# Patient Record
Sex: Female | Born: 1960 | Race: Black or African American | Hispanic: No | Smoking: Never smoker
Health system: Southern US, Community
[De-identification: ages and names within clinical notes are randomized; demographics above are authoritative.]

## PROBLEM LIST (undated history)

## (undated) DIAGNOSIS — E785 Hyperlipidemia, unspecified: Secondary | ICD-10-CM

## (undated) DIAGNOSIS — R531 Weakness: Secondary | ICD-10-CM

## (undated) DIAGNOSIS — Z86718 Personal history of other venous thrombosis and embolism: Secondary | ICD-10-CM

## (undated) DIAGNOSIS — R000 Tachycardia, unspecified: Secondary | ICD-10-CM

## (undated) DIAGNOSIS — R739 Hyperglycemia, unspecified: Secondary | ICD-10-CM

## (undated) DIAGNOSIS — N259 Disorder resulting from impaired renal tubular function, unspecified: Secondary | ICD-10-CM

## (undated) DIAGNOSIS — I6789 Other cerebrovascular disease: Secondary | ICD-10-CM

## (undated) DIAGNOSIS — F329 Major depressive disorder, single episode, unspecified: Secondary | ICD-10-CM

## (undated) DIAGNOSIS — Z7901 Long term (current) use of anticoagulants: Secondary | ICD-10-CM

## (undated) DIAGNOSIS — I2699 Other pulmonary embolism without acute cor pulmonale: Secondary | ICD-10-CM

## (undated) DIAGNOSIS — I6930 Unspecified sequelae of cerebral infarction: Secondary | ICD-10-CM

## (undated) DIAGNOSIS — Z940 Kidney transplant status: Secondary | ICD-10-CM

## (undated) DIAGNOSIS — I509 Heart failure, unspecified: Secondary | ICD-10-CM

## (undated) DIAGNOSIS — M810 Age-related osteoporosis without current pathological fracture: Secondary | ICD-10-CM

## (undated) DIAGNOSIS — K219 Gastro-esophageal reflux disease without esophagitis: Secondary | ICD-10-CM

## (undated) DIAGNOSIS — M329 Systemic lupus erythematosus, unspecified: Secondary | ICD-10-CM

## (undated) DIAGNOSIS — T380X5A Adverse effect of glucocorticoids and synthetic analogues, initial encounter: Secondary | ICD-10-CM

## (undated) DIAGNOSIS — N19 Unspecified kidney failure: Secondary | ICD-10-CM

## (undated) DIAGNOSIS — M109 Gout, unspecified: Secondary | ICD-10-CM

## (undated) DIAGNOSIS — B3781 Candidal esophagitis: Secondary | ICD-10-CM

## (undated) DIAGNOSIS — I10 Essential (primary) hypertension: Secondary | ICD-10-CM

## (undated) DIAGNOSIS — E041 Nontoxic single thyroid nodule: Secondary | ICD-10-CM

## (undated) DIAGNOSIS — E119 Type 2 diabetes mellitus without complications: Secondary | ICD-10-CM

## (undated) DIAGNOSIS — I6350 Cerebral infarction due to unspecified occlusion or stenosis of unspecified cerebral artery: Secondary | ICD-10-CM

## (undated) DIAGNOSIS — F32A Depression, unspecified: Secondary | ICD-10-CM

## (undated) DIAGNOSIS — Z8719 Personal history of other diseases of the digestive system: Secondary | ICD-10-CM

## (undated) DIAGNOSIS — F419 Anxiety disorder, unspecified: Secondary | ICD-10-CM

## (undated) HISTORY — DX: Gastro-esophageal reflux disease without esophagitis: K21.9

## (undated) HISTORY — PX: TUBAL LIGATION: SHX77

## (undated) HISTORY — DX: Depression, unspecified: F32.A

## (undated) HISTORY — DX: Other pulmonary embolism without acute cor pulmonale: I26.99

## (undated) HISTORY — DX: Personal history of other diseases of the digestive system: Z87.19

## (undated) HISTORY — DX: Essential (primary) hypertension: I10

## (undated) HISTORY — DX: Disorder resulting from impaired renal tubular function, unspecified: N25.9

## (undated) HISTORY — PX: CHOLECYSTECTOMY: SHX55

## (undated) HISTORY — DX: Systemic lupus erythematosus, unspecified: M32.9

## (undated) HISTORY — DX: Unspecified kidney failure: N19

## (undated) HISTORY — DX: Hyperlipidemia, unspecified: E78.5

## (undated) HISTORY — DX: Anxiety disorder, unspecified: F41.9

## (undated) HISTORY — DX: Age-related osteoporosis without current pathological fracture: M81.0

## (undated) HISTORY — DX: Cerebral infarction due to unspecified occlusion or stenosis of unspecified cerebral artery: I63.50

## (undated) HISTORY — DX: Personal history of other venous thrombosis and embolism: Z86.718

## (undated) HISTORY — DX: Long term (current) use of anticoagulants: Z79.01

## (undated) HISTORY — DX: Kidney transplant status: Z94.0

## (undated) HISTORY — DX: Major depressive disorder, single episode, unspecified: F32.9

## (undated) HISTORY — DX: Gout, unspecified: M10.9

## (undated) HISTORY — DX: Other cerebrovascular disease: I67.89

## (undated) HISTORY — DX: Heart failure, unspecified: I50.9

## (undated) HISTORY — DX: Type 2 diabetes mellitus without complications: E11.9

## (undated) HISTORY — DX: Tachycardia, unspecified: R00.0

## (undated) HISTORY — DX: Nontoxic single thyroid nodule: E04.1

---
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
## 1998-01-22 ENCOUNTER — Encounter: Admission: RE | Admit: 1998-01-22 | Discharge: 1998-04-22 | Payer: Self-pay | Admitting: Endocrinology

## 1998-11-04 ENCOUNTER — Encounter: Payer: Self-pay | Admitting: Endocrinology

## 1998-11-04 ENCOUNTER — Ambulatory Visit (HOSPITAL_COMMUNITY): Admission: RE | Admit: 1998-11-04 | Discharge: 1998-11-04 | Payer: Self-pay | Admitting: Endocrinology

## 1999-05-31 ENCOUNTER — Emergency Department (HOSPITAL_COMMUNITY): Admission: EM | Admit: 1999-05-31 | Discharge: 1999-05-31 | Payer: Self-pay | Admitting: Internal Medicine

## 1999-09-09 ENCOUNTER — Encounter: Admission: RE | Admit: 1999-09-09 | Discharge: 1999-12-08 | Payer: Self-pay | Admitting: Internal Medicine

## 2000-03-15 ENCOUNTER — Other Ambulatory Visit: Admission: RE | Admit: 2000-03-15 | Discharge: 2000-03-15 | Payer: Self-pay | Admitting: Obstetrics and Gynecology

## 2000-03-23 ENCOUNTER — Encounter: Payer: Self-pay | Admitting: Obstetrics and Gynecology

## 2000-03-23 ENCOUNTER — Ambulatory Visit (HOSPITAL_COMMUNITY): Admission: RE | Admit: 2000-03-23 | Discharge: 2000-03-23 | Payer: Self-pay

## 2000-05-02 ENCOUNTER — Emergency Department (HOSPITAL_COMMUNITY): Admission: EM | Admit: 2000-05-02 | Discharge: 2000-05-02 | Payer: Self-pay | Admitting: Emergency Medicine

## 2000-05-02 ENCOUNTER — Encounter: Payer: Self-pay | Admitting: Emergency Medicine

## 2000-06-07 HISTORY — PX: COLONOSCOPY: SHX174

## 2000-06-23 ENCOUNTER — Encounter: Payer: Self-pay | Admitting: Endocrinology

## 2000-06-23 ENCOUNTER — Ambulatory Visit (HOSPITAL_COMMUNITY): Admission: RE | Admit: 2000-06-23 | Discharge: 2000-06-23 | Payer: Self-pay | Admitting: Endocrinology

## 2000-07-18 ENCOUNTER — Encounter: Admission: RE | Admit: 2000-07-18 | Discharge: 2000-10-16 | Payer: Self-pay | Admitting: Endocrinology

## 2000-07-31 ENCOUNTER — Inpatient Hospital Stay (HOSPITAL_COMMUNITY): Admission: EM | Admit: 2000-07-31 | Discharge: 2000-08-04 | Payer: Self-pay | Admitting: *Deleted

## 2000-08-01 ENCOUNTER — Encounter: Payer: Self-pay | Admitting: Internal Medicine

## 2000-08-02 ENCOUNTER — Encounter: Payer: Self-pay | Admitting: Internal Medicine

## 2000-09-11 ENCOUNTER — Encounter: Payer: Self-pay | Admitting: Emergency Medicine

## 2000-09-12 ENCOUNTER — Encounter: Payer: Self-pay | Admitting: Internal Medicine

## 2000-09-12 ENCOUNTER — Inpatient Hospital Stay (HOSPITAL_COMMUNITY): Admission: EM | Admit: 2000-09-12 | Discharge: 2000-09-13 | Payer: Self-pay | Admitting: Emergency Medicine

## 2002-02-02 ENCOUNTER — Emergency Department (HOSPITAL_COMMUNITY): Admission: EM | Admit: 2002-02-02 | Discharge: 2002-02-03 | Payer: Self-pay | Admitting: Emergency Medicine

## 2002-02-03 ENCOUNTER — Ambulatory Visit (HOSPITAL_COMMUNITY): Admission: AD | Admit: 2002-02-03 | Discharge: 2002-02-03 | Payer: Self-pay | Admitting: Vascular Surgery

## 2002-02-03 ENCOUNTER — Encounter: Payer: Self-pay | Admitting: Vascular Surgery

## 2002-09-04 ENCOUNTER — Inpatient Hospital Stay (HOSPITAL_COMMUNITY): Admission: AD | Admit: 2002-09-04 | Discharge: 2002-09-10 | Payer: Self-pay | Admitting: Endocrinology

## 2002-09-07 ENCOUNTER — Encounter: Payer: Self-pay | Admitting: Endocrinology

## 2003-01-18 ENCOUNTER — Encounter: Payer: Self-pay | Admitting: Emergency Medicine

## 2003-01-18 ENCOUNTER — Inpatient Hospital Stay (HOSPITAL_COMMUNITY): Admission: EM | Admit: 2003-01-18 | Discharge: 2003-01-22 | Payer: Self-pay | Admitting: Emergency Medicine

## 2003-05-19 ENCOUNTER — Emergency Department (HOSPITAL_COMMUNITY): Admission: EM | Admit: 2003-05-19 | Discharge: 2003-05-19 | Payer: Self-pay | Admitting: Emergency Medicine

## 2005-04-22 ENCOUNTER — Ambulatory Visit: Payer: Self-pay | Admitting: Endocrinology

## 2005-05-06 ENCOUNTER — Ambulatory Visit: Payer: Self-pay | Admitting: Endocrinology

## 2005-05-07 ENCOUNTER — Ambulatory Visit: Payer: Self-pay | Admitting: Endocrinology

## 2006-09-12 ENCOUNTER — Ambulatory Visit: Payer: Self-pay | Admitting: Internal Medicine

## 2006-09-29 ENCOUNTER — Emergency Department (HOSPITAL_COMMUNITY): Admission: EM | Admit: 2006-09-29 | Discharge: 2006-09-29 | Payer: Self-pay | Admitting: Emergency Medicine

## 2007-06-06 ENCOUNTER — Encounter: Payer: Self-pay | Admitting: Endocrinology

## 2007-06-08 HISTORY — PX: KIDNEY TRANSPLANT: SHX239

## 2007-08-14 ENCOUNTER — Ambulatory Visit: Payer: Self-pay | Admitting: Internal Medicine

## 2007-08-14 ENCOUNTER — Ambulatory Visit: Payer: Self-pay | Admitting: Infectious Diseases

## 2007-08-14 ENCOUNTER — Inpatient Hospital Stay (HOSPITAL_COMMUNITY): Admission: EM | Admit: 2007-08-14 | Discharge: 2007-08-17 | Payer: Self-pay | Admitting: Emergency Medicine

## 2007-08-21 DIAGNOSIS — M810 Age-related osteoporosis without current pathological fracture: Secondary | ICD-10-CM

## 2007-08-21 DIAGNOSIS — I5032 Chronic diastolic (congestive) heart failure: Secondary | ICD-10-CM | POA: Insufficient documentation

## 2007-08-21 DIAGNOSIS — K219 Gastro-esophageal reflux disease without esophagitis: Secondary | ICD-10-CM

## 2007-08-21 DIAGNOSIS — A0472 Enterocolitis due to Clostridium difficile, not specified as recurrent: Secondary | ICD-10-CM | POA: Insufficient documentation

## 2007-08-21 DIAGNOSIS — N259 Disorder resulting from impaired renal tubular function, unspecified: Secondary | ICD-10-CM

## 2007-08-21 DIAGNOSIS — Z86718 Personal history of other venous thrombosis and embolism: Secondary | ICD-10-CM | POA: Insufficient documentation

## 2007-08-21 DIAGNOSIS — M329 Systemic lupus erythematosus, unspecified: Secondary | ICD-10-CM

## 2007-08-21 DIAGNOSIS — E119 Type 2 diabetes mellitus without complications: Secondary | ICD-10-CM

## 2007-08-21 DIAGNOSIS — I509 Heart failure, unspecified: Secondary | ICD-10-CM

## 2007-08-21 DIAGNOSIS — I10 Essential (primary) hypertension: Secondary | ICD-10-CM | POA: Insufficient documentation

## 2007-08-21 DIAGNOSIS — M109 Gout, unspecified: Secondary | ICD-10-CM | POA: Insufficient documentation

## 2007-08-21 DIAGNOSIS — E785 Hyperlipidemia, unspecified: Secondary | ICD-10-CM

## 2007-08-21 DIAGNOSIS — Z8719 Personal history of other diseases of the digestive system: Secondary | ICD-10-CM

## 2007-08-21 HISTORY — DX: Type 2 diabetes mellitus without complications: E11.9

## 2007-08-21 HISTORY — DX: Personal history of other diseases of the digestive system: Z87.19

## 2007-08-21 HISTORY — DX: Systemic lupus erythematosus, unspecified: M32.9

## 2007-08-21 HISTORY — DX: Essential (primary) hypertension: I10

## 2007-08-21 HISTORY — DX: Age-related osteoporosis without current pathological fracture: M81.0

## 2007-08-21 HISTORY — DX: Gastro-esophageal reflux disease without esophagitis: K21.9

## 2007-08-21 HISTORY — DX: Disorder resulting from impaired renal tubular function, unspecified: N25.9

## 2007-08-21 HISTORY — DX: Hyperlipidemia, unspecified: E78.5

## 2007-08-21 HISTORY — DX: Heart failure, unspecified: I50.9

## 2007-08-21 HISTORY — DX: Gout, unspecified: M10.9

## 2007-08-21 HISTORY — DX: Personal history of other venous thrombosis and embolism: Z86.718

## 2007-08-22 ENCOUNTER — Ambulatory Visit: Payer: Self-pay | Admitting: Endocrinology

## 2007-08-22 DIAGNOSIS — Z940 Kidney transplant status: Secondary | ICD-10-CM

## 2007-08-22 DIAGNOSIS — J209 Acute bronchitis, unspecified: Secondary | ICD-10-CM | POA: Insufficient documentation

## 2007-08-22 HISTORY — DX: Kidney transplant status: Z94.0

## 2007-09-21 ENCOUNTER — Encounter: Payer: Self-pay | Admitting: Endocrinology

## 2007-12-11 ENCOUNTER — Encounter: Payer: Self-pay | Admitting: Endocrinology

## 2008-02-27 ENCOUNTER — Ambulatory Visit: Payer: Self-pay | Admitting: Endocrinology

## 2008-02-27 LAB — CONVERTED CEMR LAB
INR: 2.5 — ABNORMAL HIGH (ref 0.8–1.0)
Prothrombin Time: 26.8 s — ABNORMAL HIGH (ref 10.9–13.3)

## 2008-03-21 ENCOUNTER — Encounter: Payer: Self-pay | Admitting: Endocrinology

## 2008-06-20 ENCOUNTER — Encounter: Payer: Self-pay | Admitting: Endocrinology

## 2008-11-07 ENCOUNTER — Encounter: Payer: Self-pay | Admitting: Endocrinology

## 2008-11-25 ENCOUNTER — Encounter: Payer: Self-pay | Admitting: Endocrinology

## 2008-12-10 ENCOUNTER — Telehealth (INDEPENDENT_AMBULATORY_CARE_PROVIDER_SITE_OTHER): Payer: Self-pay | Admitting: *Deleted

## 2008-12-12 ENCOUNTER — Encounter: Payer: Self-pay | Admitting: Endocrinology

## 2009-03-26 ENCOUNTER — Ambulatory Visit: Payer: Self-pay | Admitting: Internal Medicine

## 2009-03-26 DIAGNOSIS — R05 Cough: Secondary | ICD-10-CM

## 2009-03-26 DIAGNOSIS — R059 Cough, unspecified: Secondary | ICD-10-CM | POA: Insufficient documentation

## 2009-04-10 ENCOUNTER — Ambulatory Visit: Payer: Self-pay | Admitting: Endocrinology

## 2009-04-10 DIAGNOSIS — E041 Nontoxic single thyroid nodule: Secondary | ICD-10-CM

## 2009-04-10 HISTORY — DX: Nontoxic single thyroid nodule: E04.1

## 2009-04-16 ENCOUNTER — Encounter: Admission: RE | Admit: 2009-04-16 | Discharge: 2009-04-16 | Payer: Self-pay | Admitting: Endocrinology

## 2009-04-24 ENCOUNTER — Ambulatory Visit: Payer: Self-pay | Admitting: Internal Medicine

## 2009-04-24 LAB — HM DIABETES FOOT EXAM

## 2009-07-21 ENCOUNTER — Encounter: Payer: Self-pay | Admitting: Endocrinology

## 2009-08-12 ENCOUNTER — Encounter: Payer: Self-pay | Admitting: Endocrinology

## 2009-09-25 ENCOUNTER — Encounter: Payer: Self-pay | Admitting: Endocrinology

## 2009-10-02 ENCOUNTER — Encounter: Payer: Self-pay | Admitting: Endocrinology

## 2009-10-16 ENCOUNTER — Encounter: Payer: Self-pay | Admitting: Endocrinology

## 2009-10-16 ENCOUNTER — Encounter (INDEPENDENT_AMBULATORY_CARE_PROVIDER_SITE_OTHER): Payer: Self-pay | Admitting: *Deleted

## 2009-10-16 LAB — CONVERTED CEMR LAB
ALT: 20 units/L
AST: 28 units/L
CO2: 25 meq/L
Calcium: 9.3 mg/dL
Chloride: 107 meq/L
Creatinine, Ser: 0.6 mg/dL
GFR calc Af Amer: >60 mL/min
GFR calc non Af Amer: >60 mL/min
Glucose, Bld: 111 mg/dL
Potassium: 3.5 meq/L
Sodium: 140 meq/L
Total Bilirubin: 90 mg/dL

## 2009-10-17 ENCOUNTER — Encounter: Payer: Self-pay | Admitting: Endocrinology

## 2009-10-21 ENCOUNTER — Ambulatory Visit: Payer: Self-pay | Admitting: Endocrinology

## 2009-10-31 ENCOUNTER — Telehealth: Payer: Self-pay | Admitting: Endocrinology

## 2010-02-23 ENCOUNTER — Encounter: Payer: Self-pay | Admitting: Endocrinology

## 2010-03-15 ENCOUNTER — Encounter (INDEPENDENT_AMBULATORY_CARE_PROVIDER_SITE_OTHER): Payer: Self-pay | Admitting: Neurology

## 2010-03-15 ENCOUNTER — Inpatient Hospital Stay (HOSPITAL_COMMUNITY): Admission: EM | Admit: 2010-03-15 | Discharge: 2010-03-25 | Payer: Self-pay | Admitting: Emergency Medicine

## 2010-03-16 ENCOUNTER — Encounter (INDEPENDENT_AMBULATORY_CARE_PROVIDER_SITE_OTHER): Payer: Self-pay | Admitting: Neurology

## 2010-03-18 ENCOUNTER — Ambulatory Visit: Payer: Self-pay | Admitting: Physical Medicine & Rehabilitation

## 2010-04-15 ENCOUNTER — Telehealth: Payer: Self-pay | Admitting: Endocrinology

## 2010-04-15 DIAGNOSIS — I6789 Other cerebrovascular disease: Secondary | ICD-10-CM | POA: Insufficient documentation

## 2010-04-15 HISTORY — DX: Other cerebrovascular disease: I67.89

## 2010-04-17 ENCOUNTER — Telehealth: Payer: Self-pay | Admitting: Endocrinology

## 2010-04-17 ENCOUNTER — Ambulatory Visit: Payer: Self-pay | Admitting: Endocrinology

## 2010-04-17 DIAGNOSIS — I6350 Cerebral infarction due to unspecified occlusion or stenosis of unspecified cerebral artery: Secondary | ICD-10-CM

## 2010-04-17 HISTORY — DX: Cerebral infarction due to unspecified occlusion or stenosis of unspecified cerebral artery: I63.50

## 2010-04-22 ENCOUNTER — Encounter: Payer: Self-pay | Admitting: Endocrinology

## 2010-04-23 ENCOUNTER — Encounter: Payer: Self-pay | Admitting: Cardiology

## 2010-04-23 ENCOUNTER — Telehealth: Payer: Self-pay | Admitting: Endocrinology

## 2010-04-23 LAB — CONVERTED CEMR LAB
POC INR: 1.6
Prothrombin Time: 19.5 s

## 2010-04-24 ENCOUNTER — Encounter: Payer: Self-pay | Admitting: Cardiology

## 2010-04-24 ENCOUNTER — Telehealth (INDEPENDENT_AMBULATORY_CARE_PROVIDER_SITE_OTHER): Payer: Self-pay | Admitting: *Deleted

## 2010-04-27 ENCOUNTER — Telehealth: Payer: Self-pay | Admitting: Endocrinology

## 2010-04-29 ENCOUNTER — Ambulatory Visit: Payer: Self-pay | Admitting: Cardiology

## 2010-04-29 LAB — CONVERTED CEMR LAB: POC INR: 2.5

## 2010-05-05 ENCOUNTER — Ambulatory Visit: Payer: Self-pay | Admitting: Cardiology

## 2010-05-05 ENCOUNTER — Telehealth (INDEPENDENT_AMBULATORY_CARE_PROVIDER_SITE_OTHER): Payer: Self-pay | Admitting: *Deleted

## 2010-05-05 LAB — CONVERTED CEMR LAB: POC INR: 1.3

## 2010-05-12 ENCOUNTER — Ambulatory Visit: Payer: Self-pay | Admitting: Cardiovascular Disease

## 2010-05-12 LAB — CONVERTED CEMR LAB: POC INR: 2.1

## 2010-05-13 ENCOUNTER — Encounter
Admission: RE | Admit: 2010-05-13 | Discharge: 2010-06-03 | Payer: Self-pay | Source: Home / Self Care | Attending: Endocrinology | Admitting: Endocrinology

## 2010-05-13 ENCOUNTER — Encounter: Payer: Self-pay | Admitting: Endocrinology

## 2010-05-20 ENCOUNTER — Encounter: Payer: Self-pay | Admitting: Endocrinology

## 2010-05-22 ENCOUNTER — Ambulatory Visit: Payer: Self-pay | Admitting: Endocrinology

## 2010-05-22 LAB — CONVERTED CEMR LAB: Hgb A1c MFr Bld: 5.9 % (ref 4.6–6.5)

## 2010-05-26 ENCOUNTER — Ambulatory Visit: Payer: Self-pay | Admitting: Cardiology

## 2010-05-26 LAB — CONVERTED CEMR LAB: POC INR: 3

## 2010-06-03 ENCOUNTER — Encounter
Admission: RE | Admit: 2010-06-03 | Discharge: 2010-07-07 | Payer: Self-pay | Source: Home / Self Care | Attending: Endocrinology | Admitting: Endocrinology

## 2010-06-16 ENCOUNTER — Emergency Department (HOSPITAL_COMMUNITY)
Admission: EM | Admit: 2010-06-16 | Discharge: 2010-06-16 | Payer: Self-pay | Source: Home / Self Care | Admitting: Emergency Medicine

## 2010-06-16 ENCOUNTER — Encounter: Payer: Self-pay | Admitting: Endocrinology

## 2010-06-16 ENCOUNTER — Ambulatory Visit: Admit: 2010-06-16 | Payer: Self-pay

## 2010-06-22 ENCOUNTER — Encounter: Payer: Self-pay | Admitting: Endocrinology

## 2010-06-26 ENCOUNTER — Ambulatory Visit: Admission: RE | Admit: 2010-06-26 | Discharge: 2010-06-26 | Payer: Self-pay | Source: Home / Self Care

## 2010-06-26 LAB — CONVERTED CEMR LAB: POC INR: 2.5

## 2010-06-29 ENCOUNTER — Encounter: Admit: 2010-06-29 | Payer: Self-pay | Admitting: Endocrinology

## 2010-07-01 ENCOUNTER — Ambulatory Visit
Admission: RE | Admit: 2010-07-01 | Discharge: 2010-07-01 | Payer: Self-pay | Source: Home / Self Care | Attending: Internal Medicine | Admitting: Internal Medicine

## 2010-07-05 LAB — CONVERTED CEMR LAB: Hgb A1c MFr Bld: 5.7 %

## 2010-07-07 NOTE — Assessment & Plan Note (Signed)
Summary: hospital f/u SD   Vital Signs:  Patient Profile:   50 Years Old Female Weight:      164.2 pounds Temp:     98.4 degrees F oral BP sitting:   112 / 82  (left arm)  Vitals Entered By: Estell Harpin CMA (August 22, 2007 1:05 PM)                 Visit Type:  Follow-up Visit  Chief Complaint:  hospital follow up.  History of Present Illness: pt feels much better since she was in hospital recently for acute bronchitisy. still has a dry cough.     Current Allergies: No known allergies   Past Medical History:    Reviewed history from 08/21/2007 and no changes required:       Congestive heart failure, [facility]       GERD       Hyperlipidemia       Hypertension, dr [facility]ree [facility]       Renal insufficiency, dr [facility]       DVT, hx of, [facility]       Anticoagulation therapy, [facility]       Osteoporosis, [facility]       Gout, [facility]       depression, [facility]     Review of Systems  The patient denies fever and dyspnea on exhertion.     Physical Exam  General:     obese.   Lungs:     clear to auscultation     Impression & Recommendations:  Problem # 1:  ACUTE BRONCHITIS (ICD-466.0) Assessment: Improved  Orders: Est. Patient Level III [id number])    Patient Instructions: 1)  no more rx needed for acute bronchitis 2)  cc dr Bonney Leitz    ]  Appended Document: hospital f/u SD FAXED NOTESTO DR. ADAMS @ [phone]/LMB

## 2010-07-07 NOTE — Miscellaneous (Signed)
Summary: A1C  Clinical Lists Changes  Observations: Added new observation of HGBA1C: 5.7 % (06/20/2008 16:47)      -  Date:  06/20/2008    HbA1c: 5.7 [facility]/ CF

## 2010-07-07 NOTE — Medication Information (Signed)
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 no    Recent/future dental: no  Any missed doses?: no       Is patient compliant with meds? yes       Current Medications (verified): 1)  Cellcept 250 Mg  Caps (Mycophenolate Mofetil) .... Take 1 By Mouth Qd 2)  Dilt-Xr 180 Mg  Cp24 (Diltiazem Hcl) .... Take 1 By Mouth Qd 3)  Folic Acid 1 Mg  Tabs (Folic Acid) .... Take 1 By Mouth Qd 4)  Nexium 40 Mg  Cpdr (Esomeprazole Magnesium) .... Take 1 By Mouth Qd 5)  Prograf 1 Mg  Caps (Tacrolimus) .... Take 1 By Mouth Two Times A Day Qd 6)  Zoloft 100 Mg  Tabs (Sertraline Hcl) .... Take 1 By Mouth Qd 7)  Zinc Sulfate 1 Mg/ml  Soln (Zinc Sulfate) .... Take 1 By Mouth Qd 8)  Prednisone 1 Mg Tabs (Prednisone) .... Take 3 By Mouth Qd 9)  Warfarin Sodium 4 Mg Tabs (Warfarin Sodium) .... Take As Directed 10)  Prodigy Pocket Blood Glucose W/device Kit (Blood Glucose Monitoring Suppl) .... As Dir 11)  Glimepiride 4 Mg Tabs (Glimepiride) .Marland Kitchen.. 1 Tab Each Am 12)  Prodigy No Coding Blood Gluc  Strp (Glucose Blood) .... Once Daily, and Lancets 250.00 13)  Catapres-Tts-1 0.1 Mg/24hr Ptwk (Clonidine Hcl) .Marland Kitchen.. 1 Q Week 14)  Warfarin Sodium 1 Mg Tabs (Warfarin Sodium) .... Use As Directed By Anticoagualtion Clinic  Allergies: 1)  ! Codeine 2)  ! Percocet 3)  ! Darvocet 4)  ! Sulfa  Anticoagulation Management History:      The patient is taking warfarin and comes in today for a routine follow up visit.  Positive risk factors for bleeding  include history of CVA/TIA and presence of serious comorbidities.  Negative risk factors for bleeding include an age less than 60 years old.  The bleeding index is 'intermediate risk'.  Positive CHADS2 values include History of CHF, History of HTN, History of Diabetes, and Prior Stroke/CVA/TIA.  Negative CHADS2 values include Age > 71 years old.  Her last INR was 2.5 RATIO.  Anticoagulation responsible provider: Aundra Dubin MD, Issak Goley.  INR POC: 2.5.  Cuvette Lot#: [id number].    Anticoagulation Management Assessment/Plan:      The patient's current anticoagulation dose is Warfarin sodium 4 mg tabs: take as directed, Warfarin sodium 1 mg tabs: Use as directed by Anticoagualtion Clinic.  The next INR is due 05/06/2010.  Results were reviewed/authorized by Bonnita Nasuti, PharmD, BCPS, CPP.         Prior Anticoagulation Instructions: INR 1.6 Take an extra 2 tablets of the 1 mg today. Then resume normal schedule of 5 mg everyday. Recheck 11/22.   Current Anticoagulation Instructions: INR 2.5  Coumadin 4mg  tabs and 1mg  tabs take 1 tab of each daily Prescriptions: WARFARIN SODIUM 1 MG TABS (WARFARIN SODIUM) Use as directed by Anticoagualtion Clinic  #45 x 3   Entered by:   Gypsy Lore PharmD   Authorized by:   Renella Cunas, MD, [facility]   Signed  by:   Gypsy Lore PharmD on 04/29/2010   Method used:   Electronically to        [facility] [address] [phone]* (retail)       [address]       [address]       [place], [place]  [address]       Ph: [phone]       Fax: [phone]   RxID:   [id number] WARFARIN SODIUM 4 MG TABS (WARFARIN SODIUM) take as directed  #45 x 3   Entered by:   Gypsy Lore PharmD   Authorized by:   Renella Cunas, MD, [facility]   Signed by:   Gypsy Lore PharmD on 04/29/2010   Method used:   Electronically to        [facility] [address] [phone]* (retail)       [address]       [address]       [place], [place]  [address]       Ph: [phone]       Fax: [phone]    RxID:   [id number]

## 2010-07-07 NOTE — Miscellaneous (Signed)
Summary: Order/[facility]  Order/[facility]   Imported By: Phillis Knack 04/27/2010 07:43:00  _____________________________________________________________________  External Attachment:    Type:   Image     Comment:   External Document

## 2010-07-07 NOTE — Progress Notes (Signed)
Summary: Referral  Phone Note Call from Patient   Caller: Daughter [phone]  Summary of Call: Pt's daughter called to check states of referral for in-home services with [facility], daughter states that she discussed referral with [facility] at pt's last ov. Initial call taken by: Crissie Sickles, [place],  April 24, 2010 11:57 AM  Follow-up for Phone Call        [facility] would advise pt about this Follow-up by: Donavan Foil MD,  April 24, 2010 12:12 PM  Additional Follow-up for Phone Call Additional follow up Details #1::        Per daugther pt needs to be referred to [facility] which is [facility] service for assessment and services, not [facility]. Per daughter pt needs services with [place] [phone] Additional Follow-up by: Crissie Sickles, CMA,  April 24, 2010 12:53 PM    Additional Follow-up for Phone Call Additional follow up Details #2::    i forwarded to pcc Follow-up by: Donavan Foil MD,  April 24, 2010 1:02 PM

## 2010-07-07 NOTE — Progress Notes (Signed)
Summary: ABX?  Phone Note Call from Patient Call back at Home Phone [phone]   Caller: Patient Summary of Call: pt called stating that she has completed course of ABX rx'd by MD however she is still experiencing cold sys (cough, congestions and ST). Pt is requesting another ABX. Initial call taken by: Crissie Sickles, [place],  Oct 31, 2009 11:24 AM  Follow-up for Phone Call        verify no fever of sob.   i sent rx for augmentin to pharmacy Follow-up by: Donavan Foil MD,  Oct 31, 2009 12:59 PM  Additional Follow-up for Phone Call Additional follow up Details #1::        pt informed Additional Follow-up by: Crissie Sickles, [place],  Oct 31, 2009 1:33 PM    New/Updated Medications: AUGMENTIN 500-125 MG TABS (AMOXICILLIN-POT CLAVULANATE) 1 tab three times a day Prescriptions: AUGMENTIN 500-125 MG TABS (AMOXICILLIN-POT CLAVULANATE) 1 tab three times a day  #21 x 0   Entered and Authorized by:   Donavan Foil MD   Signed by:   Donavan Foil MD on 10/31/2009   Method used:   Electronically to        [facility] Dr. [phone]* (retail)       [address]       [address]       [place], [place]  [address]       Ph: [phone]       Fax: [phone]   [phone]

## 2010-07-07 NOTE — Letter (Signed)
Summary: Otolaryngology/[facility]  Otolaryngology/[facility]   Imported By: Phillis Knack 10/03/2009 09:15:36  _____________________________________________________________________  External Attachment:    Type:   Image     Comment:   External Document

## 2010-07-07 NOTE — Consult Note (Signed)
Summary: Return [place]   Return [place]   Imported By: Jerrye Noble D'jimraou 07/21/2007 09:12:39  _____________________________________________________________________  External Attachment:    Type:   Image     Comment:   External Document

## 2010-07-07 NOTE — Assessment & Plan Note (Signed)
Summary: chest cold/sae/cd   Vital Signs:  Patient profile:   50 year old female Height:      64 inches Weight:      173 pounds BMI:     29.80 O2 Sat:      98 % on Room air Temp:     98.8 degrees F oral Pulse rate:   86 / minute Pulse rhythm:   regular Resp:     16 per minute BP sitting:   144 / 82  (left arm) Cuff size:   large  Vitals Entered By: Estell Harpin CMA (April 24, 2009 2:24 PM)  Nutrition Counseling: Patient's BMI is greater than 25 and therefore counseled on weight management options.  O2 Flow:  Room air CC: chest/congestion, URI symptoms Is Patient Diabetic? Yes Did you bring your meter with you today? No   Primary Care Provider:  Donavan Foil MD  CC:  chest/congestion and URI symptoms.  History of Present Illness:  URI Symptoms      This is a 50 year old woman who presents with URI symptoms.  The symptoms began 5 days ago.  The severity is described as moderate.  The patient reports nasal congestion, sore throat, and productive cough, but denies earache and sick contacts.  Associated symptoms include low-grade fever (<100.5 degrees).  The patient denies stiff neck, dyspnea, wheezing, rash, vomiting, diarrhea, and use of an antipyretic.  The patient denies headache, muscle aches, and severe fatigue.  The patient denies the following risk factors for Strep sinusitis: Strep exposure and tender adenopathy.    Preventive Screening-Counseling & Management  Alcohol-Tobacco     Alcohol drinks/day: 0     Smoking Status: never  Current Medications (verified): 1)  Cellcept 250 Mg  Caps (Mycophenolate Mofetil) .... Take 1 By Mouth Qd 2)  Dilt-Xr 180 Mg  Cp24 (Diltiazem Hcl) .... Take 1 By Mouth Qd 3)  Folic Acid 1 Mg  Tabs (Folic Acid) .... Take 1 By Mouth Qd 4)  Nexium 40 Mg  Cpdr (Esomeprazole Magnesium) .... Take 1 By Mouth Qd 5)  Prograf 1 Mg  Caps (Tacrolimus) .... Take 1 By Mouth Two Times A Day Qd 6)  Zoloft 100 Mg  Tabs (Sertraline Hcl) .... Take 1 By  Mouth Qd 7)  Zinc Sulfate 1 Mg/ml  Soln (Zinc Sulfate) .... Take 1 By Mouth Qd 8)  Prednisone 1 Mg Tabs (Prednisone) .... Take 3 By Mouth Qd 9)  Warfarin Sodium 4 Mg Tabs (Warfarin Sodium) .... Take 1 1/2 By Mouth Qd  Allergies (verified): 1)  ! Codeine 2)  ! Percocet 3)  ! Darvocet 4)  ! Sulfa  Past History:  Past Medical History: Reviewed history from 08/22/2007 and no changes required. Congestive heart failure, dr Andree Elk, [facility] GERD Hyperlipidemia Hypertension, dr Andree Elk, [facility] Renal insufficiency, dr [facility]ree El[facility], [facility] DVT, hx of, dr Andree Elk, [facility] Anticoagulation therapy, [facility]ree [facility], [facility] Osteoporosis, [facility] Gout, [facility] depression, dr [facility]ree Elk, [facility]  Past Surgical History: Reviewed history from 08/21/2007 and no changes required. Cholecystectomy Tubal ligation  Family History: Reviewed history from 04/10/2009 and no changes required. no cancer  Social History: Reviewed history from 03/26/2009 and no changes required. Retired Divorced Never Smoked Alcohol use-no Drug use-no Regular exercise-no  Review of Systems  The patient denies fever, chest pain, syncope, dyspnea on exertion, peripheral edema, hemoptysis, abdominal pain, suspicious skin lesions, enlarged lymph nodes, and angioedema.    Physical Exam  General:  well developed, well nourished, in no  acute distress Head:  normocephalic and atraumatic.   Ears:  R ear normal and L ear normal.   Mouth:  good dentition, no exudates, no posterior lymphoid hypertrophy, no postnasal drip, no pharyngeal crowing, no lesions, no aphthous ulcers, no erosions, and pharyngeal erythema.   Neck:  supple, full ROM, no masses, no thyromegaly, no JVD, normal carotid upstroke, no carotid bruits, and no cervical lymphadenopathy.   Lungs:  Normal respiratory effort, chest expands symmetrically. Lungs are clear to auscultation, no crackles or wheezes. Heart:  Normal rate and regular rhythm. S1 and S2  normal without gallop, murmur, click, rub or other extra sounds. Abdomen:  soft, non-tender, normal bowel sounds, no distention, no masses, no guarding, no rigidity, no rebound tenderness, no hepatomegaly, and no splenomegaly.   Msk:  normal ROM, no joint tenderness, and no joint swelling.   Pulses:  R and L carotid,radial,femoral,dorsalis pedis and posterior tibial pulses are full and equal bilaterally Extremities:  No clubbing, cyanosis, edema, or deformity noted with normal full range of motion of all joints.   Neurologic:  No cranial nerve deficits noted. Station and gait are normal. Plantar reflexes are down-going bilaterally. DTRs are symmetrical throughout. Sensory, motor and coordinative functions appear intact. Skin:  turgor normal, color normal, no rashes, no suspicious lesions, no ecchymoses, no petechiae, and no purpura.   Cervical Nodes:  no anterior cervical adenopathy and no posterior cervical adenopathy.   Axillary Nodes:  no R axillary adenopathy and no L axillary adenopathy.   Inguinal Nodes:  no R inguinal adenopathy and no L inguinal adenopathy.   Psych:  Cognition and judgment appear intact. Alert and cooperative with normal attention span and concentration. No apparent delusions, illusions, hallucinations  Diabetes Management Exam:    Foot Exam (with socks and/or shoes not present):       Sensory-Pinprick/Light touch:          Left medial foot (L-4): normal          Left dorsal foot (L-5): normal          Left lateral foot (S-1): normal          Right medial foot (L-4): normal          Right dorsal foot (L-5): normal          Right lateral foot (S-1): normal       Sensory-Monofilament:          Left foot: normal          Right foot: normal       Inspection:          Left foot: normal          Right foot: normal       Nails:          Left foot: normal          Right foot: normal   Impression & Recommendations:  Problem # 1:  ACUTE BRONCHITIS  (ICD-466.0) Assessment Deteriorated  Her updated medication list for this problem includes:    Zithromax Tri-pak 500 Mg Tab (Azithromycin) ..... Once daily for 3 days  Take antibiotics and other medications as directed. Encouraged to push clear liquids, get enough rest, and take acetaminophen as needed. To be seen in 5-7 days if no improvement, sooner if worse.  Problem # 2:  COUGH (ICD-786.2) Assessment: Unchanged  Orders: T-2 View CXR ([id number])  Problem # 3:  HYPERTENSION (ICD-401.9) Assessment: Improved  Her updated medication list for this problem includes:  Dilt-xr 180 Mg Cp24 (Diltiazem hcl) .Marland Kitchen... Take 1 by mouth qd  BP today: 144/82 Prior BP: 124/80 (04/10/2009)  Complete Medication List: 1)  Cellcept 250 Mg Caps (Mycophenolate mofetil) .... Take 1 by mouth qd 2)  Dilt-xr 180 Mg Cp24 (Diltiazem hcl) .... Take 1 by mouth qd 3)  Folic Acid 1 Mg Tabs (Folic acid) .... Take 1 by mouth qd 4)  Nexium 40 Mg Cpdr (Esomeprazole magnesium) .... Take 1 by mouth qd 5)  Prograf 1 Mg Caps (Tacrolimus) .... Take 1 by mouth two times a day qd 6)  Zoloft 100 Mg Tabs (Sertraline hcl) .... Take 1 by mouth qd 7)  Zinc Sulfate 1 Mg/ml Soln (Zinc sulfate) .... Take 1 by mouth qd 8)  Prednisone 1 Mg Tabs (Prednisone) .... Take 3 by mouth qd 9)  Warfarin Sodium 4 Mg Tabs (Warfarin sodium) .... Take 1 1/2 by mouth qd 10)  Zithromax Tri-pak 500 Mg Tab (Azithromycin) .... Once daily for 3 days  Patient Instructions: 1)  Please schedule a follow-up appointment in 2 weeks. 2)  Take your antibiotic as prescribed until ALL of it is gone, but stop if you develop a rash or swelling and contact our office as soon as possible. 3)  Acute bronchitis symptoms for less than 10 days are not helped by antibiotics. take over the counter cough medications. call if no improvment in  5-7 days, sooner if increasing cough, fever, or new symptoms( shortness of breath, chest pain). Prescriptions: ZITHROMAX TRI-PAK  500 MG TAB (AZITHROMYCIN) once daily for 3 days  #3 x 0   Entered and Authorized by:   Janith Lima MD   Signed by:   Janith Lima MD on 04/24/2009   Method used:   Print then Give to Patient   RxID:   [id number]

## 2010-07-07 NOTE — Assessment & Plan Note (Signed)
Summary: cold,cough,coughing up phlegm/cd   Vital Signs:  Patient profile:   50 year old female Height:      64 inches (162.56 cm) Weight:      181.50 pounds (82.50 kg) O2 Sat:      97 % on Room air Temp:     98.6 degrees F (37.00 degrees C) oral Pulse rate:   107 / minute BP sitting:   100 / 74  (left arm) Cuff size:   regular  Vitals Entered By: Gardenia Phlegm RMA (Oct 21, 2009 4:15 PM)  O2 Flow:  Room air CC: Cold, Cough, and Coughing up phlegm X1 week/ CF Is Patient Diabetic? Yes   Primary Provider:  Donavan Foil MD  CC:  Cold, Cough, and and Coughing up phlegm X1 week/ CF.  History of Present Illness: pt states 1 week of prod cough, nasal congestion, sore throat, and low-grade fever.  Current Medications (verified): 1)  Cellcept 250 Mg  Caps (Mycophenolate Mofetil) .... Take 1 By Mouth Qd 2)  Dilt-Xr 180 Mg  Cp24 (Diltiazem Hcl) .... Take 1 By Mouth Qd 3)  Folic Acid 1 Mg  Tabs (Folic Acid) .... Take 1 By Mouth Qd 4)  Nexium 40 Mg  Cpdr (Esomeprazole Magnesium) .... Take 1 By Mouth Qd 5)  Prograf 1 Mg  Caps (Tacrolimus) .... Take 1 By Mouth Two Times A Day Qd 6)  Zoloft 100 Mg  Tabs (Sertraline Hcl) .... Take 1 By Mouth Qd 7)  Zinc Sulfate 1 Mg/ml  Soln (Zinc Sulfate) .... Take 1 By Mouth Qd 8)  Prednisone 1 Mg Tabs (Prednisone) .... Take 3 By Mouth Qd 9)  Warfarin Sodium 4 Mg Tabs (Warfarin Sodium) .... Take 1 1/2 By Mouth Qd  Allergies (verified): 1)  ! Codeine 2)  ! Percocet 3)  ! Darvocet 4)  ! Sulfa  Past History:  Past Medical History: Last updated: 08/22/2007 Congestive heart failure, dr Andree Elk, [facility] GERD Hyperlipidemia Hypertension, [facility]ree El[facility] Renal insufficiency, [facility]ree [facility] DVT, hx of, [facility] Andree Elk, [facility] Anticoagulation therapy, [facility]ree [facility] Osteoporosis, [facility]out[facility] depression, [facility]  Review of Systems       denies earache  Physical Exam  General:  obese.  no  distress  Head:  head: no deformity eyes: no periorbital swelling, no proptosis external nose and ears are normal mouth: no lesion seen Ears:  TM's intact and clear with normal canals with grossly normal hearing.   Lungs:  Clear to auscultation bilaterally. Normal respiratory effort.    Impression & Recommendations:  Problem # 1:  COUGH (ICD-786.2) due to uri  Medications Added to Medication List This Visit: 1)  Cefuroxime Axetil 250 Mg Tabs (Cefuroxime axetil) .Marland Kitchen.. 1 tab two times a day 2)  Loratadine-d 24hr 10-240 Mg Xr24h-tab (Loratadine-pseudoephedrine) .Marland Kitchen.. 1 once daily as needed for congestion 3)  Benzonatate 200 Mg Caps (Benzonatate) .Marland Kitchen.. 1 three times a day as needed for cough  Other Orders: Est. Patient Level III [id number])  Patient Instructions: 1)  cefuroxime 250 mg two times a day 2)  loratadine-d 1/day as needed for congestion 3)  benzonatate 200 mg three times a day as needed for cough. 4)  call next week if not better Prescriptions: BENZONATATE 200 MG CAPS (BENZONATATE) 1 three times a day as needed for cough  #30 x 0   Entered and Authorized by:   Donavan Foil MD   Signed by:   Donavan Foil MD  on 10/21/2009   Method used:   Electronically to        [facility] Dr[address] [phone]* (retail)       [address]       [address]       [place], [place]  [address]       Ph: [phone]       Fax: [phone]   RxID:   [id number] LORATADINE-D 24HR 10-240 MG XR24H-TAB (LORATADINE-PSEUDOEPHEDRINE) 1 once daily as needed for congestion  #14 x 0   Entered and Authorized by:   Donavan Foil MD   Signed by:   Donavan Foil MD on 10/21/2009   Method used:   Electronically to        [facility] Dr[address] [phone]* (retail)       [address]       [address]       [place], [place]  [address]       Ph: [phone]       Fax: [phone]   [id number] CEFUROXIME AXETIL 250 MG TABS (CEFUROXIME AXETIL) 1 tab two times a day  #14 x 0   Entered  and Authorized by:   Donavan Foil MD   Signed by:   Donavan Foil MD on 10/21/2009   Method used:   Electronically to        [facility] [address] [phone]* (retail)       [address]       [address]       [place], [place]  [address]       Ph: [phone]       Fax: [phone]   [place]:   [phone]

## 2010-07-07 NOTE — Medication Information (Signed)
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 no    Recent/future dental: no  Any missed doses?: no       Is patient compliant with meds? yes       Allergies: 1)  ! Codeine 2)  ! Percocet 3)  ! Darvocet 4)  ! Sulfa  Anticoagulation Management History:      The patient is taking warfarin and comes in today for a routine follow up visit.  Positive risk factors for bleeding include history of CVA/TIA and presence of serious comorbidities.  Negative risk factors for bleeding include an age less than 19 years old.  The bleeding index is 'intermediate risk'.  Positive CHADS2 values include History of CHF, History of HTN, History of Diabetes, and Prior Stroke/CVA/TIA.  Negative CHADS2 values include Age > 82 years old.  Her last INR was 2.5 RATIO.  Anticoagulation responsible provider: Johnsie Cancel MD, Collier Salina.  INR POC: 2.1.  Cuvette Lot#: [id number].  Exp: 03/2011.    Anticoagulation Management Assessment/Plan:      The patient's current anticoagulation dose is Warfarin sodium 4 mg tabs: take as directed, Warfarin sodium 1 mg tabs: Use as directed by Anticoagualtion Clinic.  The target INR is 2.0-3.0.  The next INR is due 05/26/2010.  Anticoagulation instructions were given to patient.  Results were reviewed/authorized by Porfirio Oar, PharmD.  She was notified by Porfirio Oar PharmD.         Prior Anticoagulation Instructions: INR 1.3  Take an extra 4mg  of Coumadin today, tomorrow take a total of 9mg  (1 of 1mg  tablet and 2 of the 4mg  tablets) then resume  same dose of 5mg  daily (1 of 1 mg tablet and 1 of 4 mg tablet).  Recheck INR in 1 week.   Current Anticoagulation Instructions: INR 2.1  Continue same dose of 5mg  daily.  Recheck INR in 2 weeks.

## 2010-07-07 NOTE — Letter (Signed)
Summary: Otolaryngology/[facility]  Otolaryngology/[facility]   Imported By: Phillis Knack 10/16/2009 10:05:39  _____________________________________________________________________  External Attachment:    Type:   Image     Comment:   External Document

## 2010-07-07 NOTE — Assessment & Plan Note (Signed)
Summary: post discharge [facility]/cd   Vital Signs:  Patient profile:   50 year old female Height:      64 inches (162.56 cm) Weight:      175.38 pounds (79.72 kg) BMI:     30.21 O2 Sat:      97 % on Room air Temp:     98.7 degrees F (37.06 degrees C) oral Pulse rate:   91 / minute BP sitting:   108 / 68  (right arm) Cuff size:   regular  Vitals Entered By: Rebeca Alert CMA Deborra Medina) (April 17, 2010 1:48 PM)  O2 Flow:  Room air CC: Follow up after stroke/aj Is Patient Diabetic? Yes   Primary Provider:  Donavan Foil MD  CC:  Follow up after stroke/aj.  History of Present Illness: the status of at least 3 ongoing medical problems is addressed today: 1.  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  coumadin was resumed.  she still has weakness of the entire left side of the body.  she says, compared to how the weakness was at its worst, it is now 50% better.  she was just released from [facility].  i can access the d/c summary from the hospital, but no d/c summary is available from the nh. 2.  she takes medicine for dm, but does not know the name of it.  dtr says she believes it to be amaryl 4 mg each am.  pt is living with her dtr as of now.  pt does not have a cbg monitor. 3.  she takes and tolerates htn meds.   Current Medications (verified): 1)  Cellcept 250 Mg  Caps (Mycophenolate Mofetil) .... Take 1 By Mouth Qd 2)  Dilt-Xr 180 Mg  Cp24 (Diltiazem Hcl) .... Take 1 By Mouth Qd 3)  Folic Acid 1 Mg  Tabs (Folic Acid) .... Take 1 By Mouth Qd 4)  Nexium 40 Mg  Cpdr (Esomeprazole Magnesium) .... Take 1 By Mouth Qd 5)  Prograf 1 Mg  Caps (Tacrolimus) .... Take 1 By Mouth Two Times A Day Qd 6)  Zoloft 100 Mg  Tabs (Sertraline Hcl) .... Take 1 By Mouth Qd 7)  Zinc Sulfate 1 Mg/ml  Soln (Zinc Sulfate) .... Take 1 By Mouth Qd 8)  Prednisone 1 Mg Tabs (Prednisone) .... Take 3 By Mouth Qd 9)  Warfarin Sodium 4 Mg Tabs (Warfarin Sodium) .... Take 1 1/2  By Mouth Qd 10)  Loratadine-D 24hr 10-240 Mg Xr24h-Tab (Loratadine-Pseudoephedrine) .Marland Kitchen.. 1 Once Daily As Needed For Congestion 11)  Benzonatate 200 Mg Caps (Benzonatate) .Marland Kitchen.. 1 Three Times A Day As Needed For Cough 12)  Augmentin 500-125 Mg Tabs (Amoxicillin-Pot Clavulanate) .Marland Kitchen.. 1 Tab Three Times A Day  Allergies (verified): 1)  ! Codeine 2)  ! Percocet 3)  ! Darvocet 4)  ! Sulfa  Past History:  Past Medical History: Last updated: 08/22/2007 Congestive heart failure, dr Andree Elk, [facility] GERD Hyperlipidemia Hypertension, dr [facility]ree [facility] Renal insufficiency, dr [facility]ree [facility] DVT, hx of, dr [facility]ree [facility] Anticoagulation therapy, [facility] Osteoporosis, [facility]out, [facility] depression, [facility]  Review of Systems       The patient complains of weight loss.  The patient denies syncope.    Physical Exam  General:  obese.  in wheelchair.  no distress Pulses:  dorsalis pedis intact bilat.    Extremities:  the toes of the roight foot are chronically deviated medially. trace right pedal edema  and trace left pedal edema.  mycotic toenails.     Neurologic:  sensation is intact to touch on the left foot, and decreased on the right.   Impression & Recommendations:  Problem # 1:  CVA (ICD-434.91) improved pt is ref to hh  Problem # 2:  DIABETES MELLITUS, TYPE II ([id number]) uncertain control  Problem # 3:  HYPERTENSION (ICD-401.9) overcontrolled  Medications Added to Medication List This Visit: 1)  Prodigy Pocket Blood Glucose W/device Kit (Blood glucose monitoring suppl) .... As dir 2)  Glimepiride 4 Mg Tabs (Glimepiride) .Marland Kitchen.. 1 tab each am 3)  Prodigy No Coding Blood Gluc Strp (Glucose blood) .... Once daily, and lancets 250.00 4)  Catapres-tts-1 0.1 Mg/24hr Ptwk (Clonidine hcl) .Marland Kitchen.. 1 q week  Other Orders: Home Health Referral ([place]) Est. Patient Level IV [id number])  Patient Instructions: 1)  i have sent a  prescription for a new blood-sugar meter to your pharmacy. 2)  check your blood sugar 1 time a day.  vary the time of day when you check, between before the 3 meals, and at bedtime.  also check if you have symptoms of your blood sugar being too high or too low.  please keep a record of the readings and bring it to your next appointment here.  please call us sooner if you are having blood sugar less than 80. 3)  Please schedule a follow-up appointment in 1 month. 4)  reduce catapres patch to 1-size, 1 every week.   Prescriptions: CATAPRES-TTS-1 0.1 MG/24HR PTWK (CLONIDINE HCL) 1 q week  #4 x 2   Entered and Authorized by:   Donavan Foil MD   Signed by:   Donavan Foil MD on 04/17/2010   Method used:   Electronically to        [facility] [address] [phone]* (retail)       [address]       [address]       [place], [place]  [address]       Ph: [phone]       Fax: [phone]   [id number] PRODIGY NO CODING BLOOD GLUC  STRP (GLUCOSE BLOOD) once daily, and lancets 250.00  #30 x 11   Entered and Authorized by:   Donavan Foil MD   Signed by:   Donavan Foil MD on 04/17/2010   Method used:   Electronically to        [facility] [address] [phone]* (retail)       [address]       [address]       [place], [place]  [address]       Ph: [phone]       Fax: [phone]   [id number] PRODIGY POCKET BLOOD GLUCOSE W/DEVICE KIT (BLOOD GLUCOSE MONITORING SUPPL) as dir  #1 x 0   Entered and Authorized by:   Donavan Foil MD   Signed by:   Donavan Foil MD on 04/17/2010   Method used:   Electronically to        [facility] [address] [phone]* (retail)       [address]       [address]       [place], [place]  [address]       Ph: [phone]       Fax: [phone]   [id number]    Orders Added: 1)  [place] Referral [  Home Health] 2)  Est. Patient Level IV [id number]

## 2010-07-07 NOTE — Progress Notes (Signed)
Summary: INR  Phone Note From Other Clinic   Caller: [facility] [place] Summary of Call: Pt had a stroke recently and was at nursing facility. She is now home and [facility] called lab results. Pt will need referral to coumadin clinic. RN also needs further orders for next lab draw.  PT 12.8 INR 1.1 She is currently on coumadin 4mg  once daily.  Initial call taken by: Charlsie Quest, [place],  April 15, 2010 12:01 PM  Follow-up for Phone Call        increase comadin to 5 mg once daily.   i have referred to coumadin clinic.  recheck pt/inr in 5 days Follow-up by: Donavan Foil MD,  April 15, 2010 12:46 PM  Additional Follow-up for Phone Call Additional follow up Details #1::        Pt informed in detail of above, pt expressed understanding Additional Follow-up by: Crissie Sickles, CMA,  April 15, 2010 2:22 PM  New Problems: CEREBROVASCULAR ACCIDENT, ACUTE (ICD-436)   New Problems: CEREBROVASCULAR ACCIDENT, ACUTE (ICD-436)

## 2010-07-07 NOTE — Assessment & Plan Note (Signed)
Summary: PER PT 2 WK FU  STC   Vital Signs:  Patient profile:   50 year old female Height:      64 inches (162.56 cm) Weight:      173.13 pounds (78.70 kg) BMI:     29.83 O2 Sat:      95 % on Room air Temp:     97.8 degrees F (36.56 degrees C) oral Pulse rate:   83 / minute BP sitting:   124 / 80  (left arm) Cuff size:   large  Vitals Entered By: Gardenia Phlegm CMA (April 10, 2009 3:33 PM)  O2 Flow:  Room air CC: 2 week follow up/ CF Is Patient Diabetic? Yes   Primary Provider:  Donavan Foil MD  CC:  2 week follow up/ CF.  History of Present Illness: here for regular wellness examination.  she's feeling pretty well in general, and does not drink or smoke.   Current Medications (verified): 1)  Cellcept 250 Mg  Caps (Mycophenolate Mofetil) .... Take 1 By Mouth Qd 2)  Dilt-Xr 180 Mg  Cp24 (Diltiazem Hcl) .... Take 1 By Mouth Qd 3)  Folic Acid 1 Mg  Tabs (Folic Acid) .... Take 1 By Mouth Qd 4)  Nexium 40 Mg  Cpdr (Esomeprazole Magnesium) .... Take 1 By Mouth Qd 5)  Prograf 1 Mg  Caps (Tacrolimus) .... Take 1 By Mouth Two Times A Day Qd 6)  Zoloft 100 Mg  Tabs (Sertraline Hcl) .... Take 1 By Mouth Qd 7)  Zinc Sulfate 1 Mg/ml  Soln (Zinc Sulfate) .... Take 1 By Mouth Qd 8)  Prednisone 1 Mg Tabs (Prednisone) .... Take 3 By Mouth Qd 9)  Warfarin Sodium 4 Mg Tabs (Warfarin Sodium) .... Take 1 1/2 By Mouth Qd  Allergies (verified): 1)  ! Codeine 2)  ! Percocet 3)  ! Darvocet 4)  ! Sulfa  Past History:  Past Medical History: Last updated: 08/22/2007 Congestive heart failure, dr Andree Elk, [facility] GERD Hyperlipidemia Hypertension, [facility] Renal insufficiency, [facility] DVT, hx of, dr [facility]ree Elk[facility] Anticoagulation therapy, dr [facility] Osteoporosis, [facility]out[facility] depression, [facility]  Family History: Reviewed history and no changes required. no cancer  Social History: Reviewed history from  03/26/2009 and no changes required. Retired Divorced Never Smoked Alcohol use-no Drug use-no Regular exercise-no  Review of Systems       The patient complains of weight gain.  The patient denies fever, vision loss, decreased hearing, chest pain, syncope, dyspnea on exertion, prolonged cough, headaches, abdominal pain, melena, hematochezia, severe indigestion/heartburn, hematuria, and suspicious skin lesions.         she says zoloft porovides incomplete relief of depression  Physical Exam  General:  obese.  no distress  Head:  head: no deformity eyes: no periorbital swelling.  there is bilateral proptosis external nose and ears are normal mouth: no lesion seen Neck:  there is a 1.5 cm diameter left thyroid nodule Breasts:  sees gyn  Lungs:  Clear to auscultation bilaterally. Normal respiratory effort.  Heart:  Regular rate and rhythm without murmurs or gallops noted. Normal S1,S2.   Abdomen:  abdomen is soft, nontender.  no hepatosplenomegaly.   not distended.  no hernia there are several healed surgical scars Rectal:  sees gyn  Genitalia:  sees gyn  Msk:  muscle bulk and strength are grossly normal.  no obvious joint swelling.  gait is normal and steady  Pulses:  dorsalis pedis intact bilat.  no carotid bruit  Extremities:  no deformity.  no ulcer on the feet.  feet are of normal color and temp.  no edema there is a healed surgical scar on the dorsal aspect of the left foot. Neurologic:  cn 2-12 grossly intact.   readily moves all 4's.   sensation is intact to touch on the feet  Skin:  normal texture and temp.  no rash.  not diaphoretic there is diffuse redundant skin Cervical Nodes:  No significant adenopathy.  Psych:  Alert and cooperative; normal mood and affect; normal attention span and concentration.     Impression & Recommendations:  Problem # 1:  ROUTINE GENERAL MEDICAL EXAM@HEALTH  CARE FACL (ICD-V70.0)  Other Orders: Radiology Referral (Radiology) Est.  Patient 40-64 years [phone])  Patient Instructions: 1)  pt declines changing zoloft to a more effective anti-depressant 2)  i advised colonoscopy and mammogram, as these can prevent you from dying of cancer 3)  return 1 year 4)  we discussed the recommendations of the preventive services task force   Preventive Care Screening  Last Pneumovax:    Date:  04/22/2005    Results:  given      pt sees gyn, but does not recall name

## 2010-07-07 NOTE — Progress Notes (Signed)
Summary: letter  Phone Note Call from Patient Call back at Home Phone [phone]   Caller: Patient Call For: Dr Loanne Drilling Summary of Call: Pt left forms to be completed by Dr Loanne Drilling, from [place] and handicap card.Form placed on Deborah's desk,triage B. Initial call taken by: Denice Paradise,  December 10, 2008 12:31 PM  Follow-up for Phone Call        on dr Jenny Reichmann cart Follow-up by: Nonah Mattes,  December 10, 2008 1:43 PM  Additional Follow-up for Phone Call Additional follow up Details #1::        to dr Loanne Drilling  Additional Follow-up by: Biagio Borg MD,  December 10, 2008 1:56 PM    Additional Follow-up for Phone Call Additional follow up Details #2::    what type of electrical medical equipment do you have? Follow-up by: Donavan Foil MD,  December 11, 2008 12:51 PM  Additional Follow-up for Phone Call Additional follow up Details #3:: Details for Additional Follow-up Action Taken: left msg to call back Additional Follow-up by: Nonah Mattes,  December 11, 2008 1:34 PM  called pt, pt states she has NO  medical equipment . she wanted Dr Loanne Drilling to provided the  the letter because of her health problems which Dr Florina Ou is  aware of .Additional Follow-up by: Nonah Mattes,  December 11, 2008 8:31 AM   done sean ellison, md pt was informed that letter was done at front office for pick up  Additional Follow-up by: Nonah Mattes,  December 13, 2008 8:18am

## 2010-07-07 NOTE — Medication Information (Signed)
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 no    Recent/future dental: no  Any missed doses?: no       Is patient compliant with meds? yes       Allergies: 1)  ! Codeine 2)  ! Percocet 3)  ! Darvocet 4)  ! Sulfa  Anticoagulation Management History:      The patient is taking warfarin and comes in today for a routine follow up visit.  Positive risk factors for bleeding include history of CVA/TIA and presence of serious comorbidities.  Negative risk factors for bleeding include an age less than 93 years old.  The bleeding index is 'intermediate risk'.  Positive CHADS2 values include History of CHF, History of HTN, History of Diabetes, and Prior Stroke/CVA/TIA.  Negative CHADS2 values include Age > 34 years old.  Her last INR was 2.5 RATIO.  Anticoagulation responsible provider: Ron Parker MD, Dellis Filbert.  INR POC: 1.3.  Cuvette Lot#: [id number].  Exp: 05/2011.    Anticoagulation Management Assessment/Plan:      The patient's current anticoagulation dose is Warfarin sodium 4 mg tabs: take as directed, Warfarin sodium 1 mg tabs: Use as directed by Anticoagualtion Clinic.  The target INR is 2.0-3.0.  The next INR is due 05/12/2010.  Anticoagulation instructions were given to patient.  Results were reviewed/authorized by Porfirio Oar, PharmD.  She was notified by Porfirio Oar PharmD.         Prior Anticoagulation Instructions: INR 2.5  Coumadin 4mg  tabs and 1mg  tabs take 1 tab of each daily  Current Anticoagulation Instructions: INR  1.3  Take an extra 4mg  of Coumadin today, tomorrow take a total of 9mg  (1 of 1mg  tablet and 2 of the 4mg  tablets) then resume same dose of 5mg  daily (1 of 1 mg tablet and 1 of 4 mg tablet).  Recheck INR in 1 week.

## 2010-07-07 NOTE — Letter (Signed)
Summary: [facility]   Imported By: Bubba Hales 10/30/2009 10:16:18  _____________________________________________________________________  External Attachment:    Type:   Image     Comment:   External Document

## 2010-07-07 NOTE — Progress Notes (Signed)
  Phone Note Call from Patient   Caller: Barton Fanny from [facility] Summary of Call: Alyse Low called to follow to see if pt is being referred to coumadin clinic.  called her back. stated that she is being referred to coumadin clinic. Initial call taken by: Glenda Chroman,  April 17, 2010 1:59 PM

## 2010-07-07 NOTE — Miscellaneous (Signed)
Summary: Lab results  Clinical Lists Changes  Observations: Added new observation of BILI TOTAL: 90 mg/dL (10/16/2009 15:06) Added new observation of SGPT (ALT): 20 units/L (10/16/2009 15:06) Added new observation of SGOT (AST): 28 units/L (10/16/2009 15:06) Added new observation of GFRAA: >60 mL/min (10/16/2009 15:06) Added new observation of GFR: >60 mL/min (10/16/2009 15:06) Added new observation of CALCIUM: 9.3 mg/dL (10/16/2009 15:06) Added new observation of CO2 PLSM/SER: 25 meq/L (10/16/2009 15:06) Added new observation of CL SERUM: 107 meq/L (10/16/2009 15:06) Added new observation of K SERUM: 3.5 meq/L (10/16/2009 15:06) Added new observation of NA: 140 meq/L (10/16/2009 15:06) Added new observation of CREATININE: 0.6 mg/dL (10/16/2009 15:06) Added new observation of BG RANDOM: 111 mg/dL (10/16/2009 15:06)      -  Date:  10/16/2009    BG Random: 111    Creatinine: 0.6    Sodium: 140    Potassium: 3.5    Chloride: 107    CO2 Total: 25    Calcium: 9.3    GFR(Non African American): >60    GFR(African American): >60    AST: 28    ALT: 20    T Bili: 90

## 2010-07-07 NOTE — Letter (Signed)
Summary: [facility]   Imported By: Bubba Hales 11/18/2008 09:30:07  _____________________________________________________________________  External Attachment:    Type:   Image     Comment:   External Document

## 2010-07-07 NOTE — Progress Notes (Signed)
  Phone Note Call from Patient   Caller: Daughter [phone] [facility] Summary of Call: Pt called to inquire about the status of referral to [facility] for pt. Please advise pt's daughter, she is very anxious to start care for pt. Initial call taken by: Crissie Sickles, CMA,  May 05, 2010 10:42 AM  Follow-up for Phone Call        referral form filled out and forwarded to SAE for signature. Crissie Sickles, CMA  May 05, 2010 4:14 PM   Additional Follow-up for Phone Call Additional follow up Details #1::        i have signed.  please fill in dxs and fax back Additional Follow-up by: Donavan Foil MD,  May 05, 2010 4:28 PM

## 2010-07-07 NOTE — Letter (Signed)
Summary: Return [facility]  Return [facility]   Imported By: Jerrye Noble D'jimraou 07/15/2008 14:29:21  _____________________________________________________________________  External Attachment:    Type:   Image     Comment:   External Document

## 2010-07-07 NOTE — Consult Note (Signed)
Summary: [facility]  [facility]   Imported By: Jerrye Noble D'jimraou 10/16/2007 15:01:36  _____________________________________________________________________  External Attachment:    Type:   Image     Comment:   External Document

## 2010-07-07 NOTE — Letter (Signed)
Summary: nocturia,1+ edema lower extremities/[facility]  nocturia,1+ edema lower extremities/[facility]   Imported By: Bubba Hales 04/16/2008 14:02:10  _____________________________________________________________________  External Attachment:    Type:   Image     Comment:   External Document

## 2010-07-07 NOTE — Medication Information (Signed)
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 no    Recent/future dental: no  Any missed doses?: no       Is patient compliant with meds? yes      Comments: she was followed by [facility] and dr Loanne Drilling. But is now being transitioned to our coumadin clinic. Initial appointment on tuesday nov 22nd.   Allergies: 1)  ! Codeine 2)  ! Percocet 3)  ! Darvocet 4)  ! Sulfa  Anticoagulation Management History:      Her anticoagulation is being managed by telephone today.  Positive risk factors for bleeding include history of CVA/TIA and presence of serious comorbidities.  Negative risk factors for bleeding include an age less than 67 years old.  The bleeding index is 'intermediate risk'.  Positive CHADS2 values include History of CHF, History of HTN, History of Diabetes, and Prior Stroke/CVA/TIA.  Negative CHADS2 values include Age > 22 years old.  Her last INR was 2.5 RATIO.  Prothrombin time is 19.5.  Anticoagulation responsible provider: Percival Spanish MD, Jeneen Rinks.  INR POC: 1.6.    Anticoagulation Management Assessment/Plan:      The patient's current anticoagulation dose is Warfarin sodium 4 mg tabs: TAKE 1 1/2 by mouth QD.  Results were reviewed/authorized by Mammie Lorenzo.         Current Anticoagulation Instructions: INR 1.6 Take an extra 2 tablets of the 1 mg today. Then resume normal schedule of 5 mg everyday. Recheck 11/22.

## 2010-07-07 NOTE — Progress Notes (Signed)
  Phone Note From Other Clinic   Caller: Candie Mile RN, [facility] Summary of Call: Candie Mile RN with [facility] to call and report pts. INR 1.6 and 19.5 seconds.  Results have been sent. Call back number is [phone]. Initial call taken by: Robin Ewing CMA Deborra Medina),  April 23, 2010 1:22 PM  Follow-up for Phone Call        pt had appt with coumadin clinic 3 days ago.  why did pt not go? Follow-up by: Donavan Foil MD,  April 23, 2010 1:25 PM  Additional Follow-up for Phone Call Additional follow up Details #1::        Pt states she was unaware of appt but she was transferred to re-schedule. Additional Follow-up by: Crissie Sickles, [place],  April 24, 2010 9:10 AM    Additional Follow-up for Phone Call Additional follow up Details #2::    same coumadin for now go to coumadin clinic next week Follow-up by: Donavan Foil MD,  April 24, 2010 9:30 AM  Additional Follow-up for Phone Call Additional follow up Details #3:: Details for Additional Follow-up Action Taken: Pt aware Additional Follow-up by: Crissie Sickles, [place],  April 24, 2010 9:37 AM

## 2010-07-07 NOTE — Letter (Signed)
Summary: Nephrology/[facility]  Nephrology/[facility]   Imported By: Phillis Knack 08/01/2009 07:53:06  _____________________________________________________________________  External Attachment:    Type:   Image     Comment:   External Document

## 2010-07-07 NOTE — Consult Note (Signed)
Summary: Return [facility]  Return [facility]   Imported By: Jerrye Noble D'jimraou 01/10/2008 12:17:35  _____________________________________________________________________  External Attachment:    Type:   Image     Comment:   External Document

## 2010-07-07 NOTE — Letter (Signed)
Summary: Application for [facility]ic[facility] Placard  Application for [facility]apped Pl[facility]ard   Imported By: Laural Benes 12/13/2008 15:25:37  _____________________________________________________________________  External Attachment:    Type:   Image     Comment:   External Document

## 2010-07-07 NOTE — Assessment & Plan Note (Signed)
Summary: left eye red,no cold/$50/cd   Vital Signs:  Patient Profile:   50 Years Old Female Weight:      172.8 pounds Temp:     98.0 degrees F oral Pulse rate:   84 / minute BP sitting:   118 / 78  (left arm) Cuff size:   regular  Vitals Entered By: Tomma Lightning (February 27, 2008 3:26 PM)                 Chief Complaint:  (L) EYE RED.  History of Present Illness: few days redness left eye.  not sure when she ahd her last coumadin check at [facility], but says "i think it is time for me to get a check."      Current Allergies: No known allergies   Past Medical History:    Reviewed history from 08/22/2007 and no changes required:       Congestive heart failure, dr Andree Elk, [facility]       GERD       Hyperlipidemia       Hypertension, dr Andree Elk, [facility]       Renal insufficiency, dr Andree Elk, [facility]       DVT, hx of, dr Andree Elk, [facility]       Anticoagulation therapy, dr Andree Elk, [facility]       Osteoporosis, rheumatol at [facility]       Gout, dr Andree Elk, [facility]       depression, dr Andree Elk, [facility]     Review of Systems  The patient denies vision loss.     Physical Exam  General:     well developed, well nourished, in no acute distress Eyes:     no change in bilateral proptosis.  has left subconjunctival hemorrhage, which does not involve the iris or the pupil Additional Exam:     PROTHROMBIN TIME     [H]  26.8 SEC                    10.9-13.3 INR                  [H]  2.5 RATIO       Impression & Recommendations:  Problem # 1:  SUBCONJUNCTIVAL HEMORRHAGE, LEFT (ICD-372.72)  Medications Added to Medication List This Visit: 1)  Prednisone 1 Mg Tabs (Prednisone) .... Take 3 by mouth qd 2)  Warfarin Sodium 4 Mg Tabs (Warfarin sodium) .... Take 1 1/2 by mouth qd  Other Orders: TLB-PT (Protime) (85610-PTP)   Patient Instructions: 1)  please schedule a f/u appt at [facility] 2)  same comadin   ]

## 2010-07-07 NOTE — Progress Notes (Signed)
Summary: referral  Phone Note Other Incoming   Caller: Dianne OT with [facility] Summary of Call: OT called requesting a referral to eval and treat for OT and speech therapy to [facility]. Initial call taken by: Crissie Sickles, [place],  April 27, 2010 2:03 PM  Follow-up for Phone Call        i sent Follow-up by: Donavan Foil MD,  April 27, 2010 2:09 PM

## 2010-07-07 NOTE — Assessment & Plan Note (Signed)
Summary: sore throat/sae/cd   Vital Signs:  Patient profile:   50 year old female Weight:      171 pounds O2 Sat:      97 % on Room air Temp:     98.5 degrees F rectal Pulse rate:   84 / minute Pulse rhythm:   regular Resp:     16 per minute BP sitting:   130 / 80  (left arm) Cuff size:   large  Vitals Entered By: Estell Harpin CMA (March 26, 2009 2:12 PM)  O2 Flow:  Room air CC: headache, sore throat congestion, URI symptoms   Primary Care Provider:  Donavan Foil MD  CC:  headache, sore throat congestion, and URI symptoms.  History of Present Illness:  URI Symptoms      This is a 50 year old woman who presents with URI symptoms.  The symptoms began 5 days ago.  The severity is described as moderate.  The patient reports nasal congestion, purulent nasal discharge, sore throat, and productive cough, but denies earache and sick contacts.  The patient denies fever, stiff neck, dyspnea, wheezing, rash, vomiting, diarrhea, and use of an antipyretic.  The patient denies headache, muscle aches, and severe fatigue.  Risk factors for Strep sinusitis include unilateral facial pain, unilateral nasal discharge, and double sickening.  The patient denies the following risk factors for Strep sinusitis: tender adenopathy.    Preventive Screening-Counseling & Management  Alcohol-Tobacco     Alcohol drinks/day: 0     Smoking Status: never  Caffeine-Diet-Exercise     Does Patient Exercise: no  Hep-HIV-STD-Contraception     Hepatitis Risk: no risk noted     HIV Risk: no risk noted     STD Risk: no risk noted      Drug Use:  no.    Current Medications (verified): 1)  Cellcept 250 Mg  Caps (Mycophenolate Mofetil) .... Take 1 By Mouth Qd 2)  Dilt-Xr 180 Mg  Cp24 (Diltiazem Hcl) .... Take 1 By Mouth Qd 3)  Folic Acid 1 Mg  Tabs (Folic Acid) .... Take 1 By Mouth Qd 4)  Nexium 40 Mg  Cpdr (Esomeprazole Magnesium) .... Take 1 By Mouth Qd 5)  Prograf 1 Mg  Caps (Tacrolimus) .... Take 1 By  Mouth Two Times A Day Qd 6)  Zoloft 100 Mg  Tabs (Sertraline Hcl) .... Take 1 By Mouth Qd 7)  Zinc Sulfate 1 Mg/ml  Soln (Zinc Sulfate) .... Take 1 By Mouth Qd 8)  Prednisone 1 Mg Tabs (Prednisone) .... Take 3 By Mouth Qd 9)  Warfarin Sodium 4 Mg Tabs (Warfarin Sodium) .... Take 1 1/2 By Mouth Qd  Allergies (verified): 1)  ! Codeine 2)  ! Percocet 3)  ! Darvocet 4)  ! Sulfa  Past History:  Past Medical History: Reviewed history from 08/22/2007 and no changes required. Congestive heart failure, dr Andree Elk, [facility] GERD Hyperlipidemia Hypertension, dr Andree Elk, [facility] Renal insufficiency, dr Andree Elk, [facility] DVT, hx of, dr Andree Elk, [facility] Anticoagulation therapy, dr Andree Elk, [facility] Osteoporosis, rheumatol at [facility] Gout, dr [facility]ree [facility]k, [facility] depression, dr Andree Elk, [facility]  Past Surgical History: Reviewed history from 08/21/2007 and no changes required. Cholecystectomy Tubal ligation  Social History: Retired Divorced Never Smoked Alcohol use-no Drug use-no Regular exercise-no Smoking Status:  never Hepatitis Risk:  no risk noted HIV Risk:  no risk noted STD Risk:  no risk noted Drug Use:  no Does Patient Exercise:  no  Review of Systems  The patient complains of prolonged cough.  The patient denies anorexia, fever, weight loss, weight gain, chest pain, syncope, peripheral edema, headaches, hemoptysis, abdominal pain, hematuria, and enlarged lymph nodes.    Physical Exam  General:  well developed, well nourished, in no acute distress Head:  normocephalic and atraumatic.   Eyes:  no icterus or injection Ears:  R ear normal and L ear normal.   Nose:  no mucosal friability, no active bleeding or clots, no sinus percussion tenderness, no septum abnormalities, nasal discharge, mucosal erythema, and mucosal edema.   Mouth:  good dentition, no exudates, no posterior lymphoid hypertrophy, no postnasal drip, no pharyngeal crowing, no lesions, no aphthous ulcers, no erosions, and  pharyngeal erythema.   Neck:  supple, full ROM, no masses, no thyromegaly, no JVD, normal carotid upstroke, no carotid bruits, and no cervical lymphadenopathy.   Lungs:  Normal respiratory effort, chest expands symmetrically. Lungs are clear to auscultation, no crackles or wheezes. Heart:  Normal rate and regular rhythm. S1 and S2 normal without gallop, murmur, click, rub or other extra sounds. Abdomen:  soft, non-tender, normal bowel sounds, no distention, no masses, no guarding, no rigidity, no rebound tenderness, no hepatomegaly, and no splenomegaly.   Msk:  normal ROM, no joint tenderness, and no joint swelling.   Pulses:  R and L carotid,radial,femoral,dorsalis pedis and posterior tibial pulses are full and equal bilaterally Extremities:  No clubbing, cyanosis, edema, or deformity noted with normal full range of motion of all joints.   Neurologic:  No cranial nerve deficits noted. Station and gait are normal. Plantar reflexes are down-going bilaterally. DTRs are symmetrical throughout. Sensory, motor and coordinative functions appear intact. Skin:  Intact without suspicious lesions or rashes Cervical Nodes:  No lymphadenopathy noted Axillary Nodes:  No palpable lymphadenopathy Inguinal Nodes:  No significant adenopathy Psych:  Cognition and judgment appear intact. Alert and cooperative with normal attention span and concentration. No apparent delusions, illusions, hallucinations  Diabetes Management Exam:    Foot Exam (with socks and/or shoes not present):       Sensory-Pinprick/Light touch:          Left medial foot (L-4): normal          Left dorsal foot (L-5): normal          Left lateral foot (S-1): normal          Right medial foot (L-4): normal          Right dorsal foot (L-5): normal          Right lateral foot (S-1): normal       Sensory-Monofilament:          Left foot: normal          Right foot: normal       Inspection:          Left foot: normal          Right foot:  normal       Nails:          Left foot: normal          Right foot: normal   Impression & Recommendations:  Problem # 1:  COUGH (ICD-786.2)  Orders: T-2 View CXR (71020TC)  Problem # 2:  ACUTE BRONCHITIS (ICD-466.0) Assessment: Deteriorated  Her updated medication list for this problem includes:    Ceftin 500 Mg Tab (Cefuroxime axetil) .Marland Kitchen... Take one (1) tablet by mouth two (2) times a day x 10 days  Problem # 3:  HYPERTENSION (ICD-401.9) Assessment: Unchanged  Her updated medication list for this problem includes:    Dilt-xr 180 Mg Cp24 (Diltiazem hcl) .Marland Kitchen... Take 1 by mouth qd  BP today: 130/80 Prior BP: 118/78 (02/27/2008)  Complete Medication List: 1)  Cellcept 250 Mg Caps (Mycophenolate mofetil) .... Take 1 by mouth qd 2)  Dilt-xr 180 Mg Cp24 (Diltiazem hcl) .... Take 1 by mouth qd 3)  Folic Acid 1 Mg Tabs (Folic acid) .... Take 1 by mouth qd 4)  Nexium 40 Mg Cpdr (Esomeprazole magnesium) .... Take 1 by mouth qd 5)  Prograf 1 Mg Caps (Tacrolimus) .... Take 1 by mouth two times a day qd 6)  Zoloft 100 Mg Tabs (Sertraline hcl) .... Take 1 by mouth qd 7)  Zinc Sulfate 1 Mg/ml Soln (Zinc sulfate) .... Take 1 by mouth qd 8)  Prednisone 1 Mg Tabs (Prednisone) .... Take 3 by mouth qd 9)  Warfarin Sodium 4 Mg Tabs (Warfarin sodium) .... Take 1 1/2 by mouth qd 10)  Ceftin 500 Mg Tab (Cefuroxime axetil) .... Take one (1) tablet by mouth two (2) times a day x 10 days  Patient Instructions: 1)  Please schedule a follow-up appointment in 2 weeks. 2)  Check your Blood Pressure regularly. If it is above 130/80: you should make an appointment. 3)  Take your antibiotic as prescribed until ALL of it is gone, but stop if you develop a rash or swelling and contact our office as soon as possible. 4)  Acute bronchitis symptoms for less than 10 days are not helped by antibiotics. take over the counter cough medications. call if no improvment in  5-7 days, sooner if increasing cough, fever,  or new symptoms( shortness of breath, chest pain). Prescriptions: CEFTIN 500 MG TAB (CEFUROXIME AXETIL) Take one (1) tablet by mouth two (2) times a day X 10 days  #20 x 1   Entered and Authorized by:   Janith Lima MD   Signed by:   Janith Lima MD on 03/26/2009   Method used:   Print then Give to Patient   RxIDTY:[id number]  Flu Vaccine Consent Questions     Do you have a history of severe allergic reactions to this vaccine? no    Any prior history of allergic reactions to egg and/or gelatin? no    Do you have a sensitivity to the preservative Thimersol? no    Do you have a past history of Guillan-Barre Syndrome? no    Do you currently have an acute febrile illness? no    Have you ever had a severe reaction to latex? no    Vaccine information given and explained to patient? yes    Are you currently pregnant? no    Lot Number:[id number]   Exp Date:12/04/2009   Site Given  Left Deltoid IM   Signed by:   Janith Lima MD on 03/26/2009   Method used:   Print then Give to Patient   RxID:   [id number]    Immunizations Administered:  Tetanus Vaccine:    Vaccine Type: Tdap    Site: right deltoid    Mfr: GlaxoSmithKline    Dose: 0.5 ml    Route: IM    Given by: Estell Harpin CMA    Exp. Date: 12/21/2010    Lot #: [id number]    VIS given: 04/25/07 version given March 27, 2009. Marland Kitchenlbflu

## 2010-07-07 NOTE — Letter (Signed)
Summary: [facility]   Imported By: Bubba Hales 10/28/2009 10:37:32  _____________________________________________________________________  External Attachment:    Type:   Image     Comment:   External Document

## 2010-07-07 NOTE — Letter (Signed)
Summary: Medical Alert Certificate/[employer]  Medical Alert Certificate/[employer]   Imported By: Laural Benes 12/13/2008 15:26:59  _____________________________________________________________________  External Attachment:    Type:   Image     Comment:   External Document

## 2010-07-09 ENCOUNTER — Encounter: Payer: Self-pay | Admitting: Occupational Therapy

## 2010-07-09 NOTE — Letter (Signed)
Summary: [facility]  [facility]   Imported By: Rise Patience 06/26/2010 15:10:33  _____________________________________________________________________  External Attachment:    Type:   Image     Comment:   External Document

## 2010-07-09 NOTE — Miscellaneous (Signed)
Summary: Speech therapy/[place]  Speech therapy/[place]   Imported By: Bubba Hales 05/22/2010 07:23:29  _____________________________________________________________________  External Attachment:    Type:   Image     Comment:   External Document

## 2010-07-09 NOTE — Miscellaneous (Signed)
Summary: ST Eval/[place]  [facility] Eval/[place]   Imported By: Phillis Knack 05/25/2010 11:27:32  _____________________________________________________________________  External Attachment:    Type:   Image     Comment:   External Document

## 2010-07-09 NOTE — Assessment & Plan Note (Signed)
Summary: cold/congestion/sae/cd   Vital Signs:  Patient profile:   50 year old female Height:      64 inches (162.56 cm) O2 Sat:      92 % on Room air Temp:     99.1 degrees F (37.28 degrees C) oral Pulse rate:   98 / minute BP sitting:   110 / 82  (left arm) Cuff size:   regular  Vitals Entered By: Tomma Lightning RMA (July 01, 2010 11:17 AM)  O2 Flow:  Room air CC: cold symptoms Is Patient Diabetic? Yes Did you bring your meter with you today? No Pain Assessment Patient in pain? no        CC:  cold symptoms.  Current Medications (verified): 1)  Cellcept 250 Mg  Caps (Mycophenolate Mofetil) .... Take 1 By Mouth Qd 2)  Dilt-Xr 180 Mg  Cp24 (Diltiazem Hcl) .... Take 1 By Mouth Qd 3)  Folic Acid 1 Mg  Tabs (Folic Acid) .... Take 1 By Mouth Qd 4)  Nexium 40 Mg  Cpdr (Esomeprazole Magnesium) .... Take 1 By Mouth Qd 5)  Prograf 1 Mg  Caps (Tacrolimus) .... Take 1 By Mouth Two Times A Day Qd 6)  Zoloft 100 Mg  Tabs (Sertraline Hcl) .... Take 1 By Mouth Qd 7)  Zinc Sulfate 1 Mg/ml  Soln (Zinc Sulfate) .... Take 1 By Mouth Qd 8)  Prednisone 1 Mg Tabs (Prednisone) .... Take 3 By Mouth Qd 9)  Warfarin Sodium 4 Mg Tabs (Warfarin Sodium) .... Take As Directed 10)  Prodigy Pocket Blood Glucose W/device Kit (Blood Glucose Monitoring Suppl) .... As Dir 11)  Prodigy No Coding Blood Gluc  Strp (Glucose Blood) .... Once Daily, and Lancets 250.00 12)  Warfarin Sodium 1 Mg Tabs (Warfarin Sodium) .... Use As Directed By Anticoagualtion Clinic 13)  Glimepiride 2 Mg Tabs (Glimepiride) .Marland Kitchen.. 1 Tab Each Am.  This Replaces The 4 Mg Sent Earlier Today).  Allergies (verified): 1)  ! Codeine 2)  ! Percocet 3)  ! Darvocet 4)  ! Sulfa  Past History:  Past Medical History: Congestive heart failure, dr Andree Elk, [facility] GERD Hyperlipidemia Hypertension, dr Andree Elk, [facility] Renal failure s/p renal transplant - dr Andree Elk, [facility] DVT, hx of, dr Andree Elk, [facility] Anticoagulation therapy, dr Andree Elk,  [facility] Osteoporosis, rheumatol at [facility]   Gout, dr Andree Elk, [facility] depression, dr Andree Elk, [facility]  Review of Systems  The patient denies anorexia, decreased hearing, hoarseness, syncope, hemoptysis, and abdominal pain.    Physical Exam  General:  well developed, well nourished, in no acute distress but mildly ill appearing and fatigued Eyes:  no icterus or injection Ears:  R ear normal and L ear normal.   Mouth:  teeth and gums in good repair; mucous membranes moist, without lesions or ulcers. oropharynx clear without exudate, mod erythema.  Lungs:  few rhonchi, no crackles - no inc WOB at rest or with conversation - no w/c Heart:  Normal rate and regular rhythm. S1 and S2 normal without gallop, murmur, click, rub or other extra sounds.   Impression & Recommendations:  Problem # 1:  ACUTE BRONCHITIS (ICD-466.0)  Her updated medication list for this problem includes:    Keflex 500 Mg Caps (Cephalexin) .Marland Kitchen... 1 by mouth three times a day x 7 days    Mucinex 600 Mg Xr12h-tab (Guaifenesin) .Marland Kitchen... 1 by mouth two times a day for cough symptoms  Take antibiotics and other medications as directed. Encouraged to push clear liquids, get enough rest, and take acetaminophen as  needed. To be seen in 5-7 days if no improvement, sooner if worse.  Orders: Prescription Created Electronically [phone])  Complete Medication List: 1)  Cellcept 250 Mg Caps (Mycophenolate mofetil) .... Take 1 by mouth qd 2)  Dilt-xr 180 Mg Cp24 (Diltiazem hcl) .... Take 1 by mouth qd 3)  Folic Acid 1 Mg Tabs (Folic acid) .... Take 1 by mouth qd 4)  Nexium 40 Mg Cpdr (Esomeprazole magnesium) .... Take 1 by mouth qd 5)  Prograf 1 Mg Caps (Tacrolimus) .... Take 1 by mouth two times a day qd 6)  Zoloft 100 Mg Tabs (Sertraline hcl) .... Take 1 by mouth qd 7)  Zinc Sulfate 1 Mg/ml Soln (Zinc sulfate) .... Take 1 by mouth qd 8)  Prednisone 1 Mg Tabs (Prednisone) .... Take 3 by mouth qd 9)  Warfarin Sodium 4 Mg Tabs  (Warfarin sodium) .... Take as directed 10)  Prodigy Pocket Blood Glucose W/device Kit (Blood glucose monitoring suppl) .... As dir 11)  Prodigy No Coding Blood Gluc Strp (Glucose blood) .... Once daily, and lancets 250.00 12)  Warfarin Sodium 1 Mg Tabs (Warfarin sodium) .... Use as directed by anticoagualtion clinic 13)  Glimepiride 2 Mg Tabs (Glimepiride) .Marland Kitchen.. 1 tab each am.  this replaces the 4 mg sent earlier today). 14)  Keflex 500 Mg Caps (Cephalexin) .Marland Kitchen.. 1 by mouth three times a day x 7 days 15)  Mucinex 600 Mg Xr12h-tab (Guaifenesin) .Marland Kitchen.. 1 by mouth two times a day for cough symptoms  Patient Instructions: 1)  it was good to see you today. 2)  Keflex antibiotics for bronchitis and Mucinex (or robitussin) - your prescriptions have been electronically submitted to your pharmacy. Please take as directed. Contact our office if you believe you're having problems with the medication(s).  3)  Get plenty of rest, drink lots of clear liquids, and use Tylenol or Ibuprofen for fever and comfort. Return in 7-10 days if you're not better:sooner if you're feeling worse. Prescriptions: KEFLEX 500 MG CAPS (CEPHALEXIN) 1 by mouth three times a day x 7 days  #21 x 0   Entered and Authorized by:   Rowe Clack MD   Signed by:   Rowe Clack MD on 07/01/2010   Method used:   Electronically to        [facility] [phone]* (retail)       [address]       [address]       [place], [place]  [address]       Ph: [phone]       Fax: [phone]   RxID:   [id number]    Orders Added: 1)  Est. Patient Level IV [id number] 2)  Prescription Created Electronically [phone]  Appended Document: cold/congestion/sae/cd HPI -  c/o cough and chest congestion - onset 72 h ago, course progressive - assoc with LGF, productive sputum and DOE but denies HA, nasal discharge or CP/pleurisy. no travel or sick contacts +hx same, no recent med changes not improved with OTC meds no hx allg  rhinitis or sinus problems  CBGs "ok"

## 2010-07-09 NOTE — Medication Information (Signed)
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 no    Recent/future dental: no  Any missed doses?: no       Is patient compliant with meds? yes       Allergies: 1)  ! Codeine 2)  ! Percocet 3)  ! Darvocet 4)  ! Sulfa  Anticoagulation Management History:      The patient is taking warfarin and comes in today for a routine follow up visit.  Positive risk factors for bleeding include history of CVA/TIA and presence of serious comorbidities.  Negative risk factors for bleeding include an age less than 74 years old.  The bleeding index is 'intermediate risk'.  Positive CHADS2 values include History of CHF, History of HTN, History of Diabetes, and Prior Stroke/CVA/TIA.  Negative CHADS2 values include Age > 63 years old.  Her last INR was 2.5 RATIO.  Anticoagulation responsible Zala Degrasse: Verl Blalock MD, Marcello Moores.  INR POC: 3.0.  Cuvette Lot#: [id number].  Exp: 07/2011.    Anticoagulation Management Assessment/Plan:      The patient's current anticoagulation dose is Warfarin sodium 4 mg tabs: take as directed, Warfarin sodium 1 mg tabs: Use as directed by Anticoagualtion Clinic.  The target INR is 2.0-3.0.  The next INR is due 06/16/2010.  Anticoagulation instructions were given to patient.  Results were reviewed/authorized by Porfirio Oar, PharmD.  She was notified by Porfirio Oar PharmD.         Prior Anticoagulation Instructions: INR 2.1  Continue same dose of 5mg  daily.  Recheck INR in 2 weeks.   Current Anticoagulation Instructions: INR 3.0  Continue same  dose of 5mg  daily.  Recheck INR in 3 weeks.

## 2010-07-09 NOTE — Medication Information (Signed)
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 no    Recent/future dental: no  Any missed doses?: no       Is patient compliant with meds? yes       Allergies: 1)  ! Codeine 2)  ! Percocet 3)  ! Darvocet 4)  ! Sulfa  Anticoagulation Management History:      The patient is taking warfarin and comes in today for a routine follow up visit.  Positive risk factors for bleeding include history of CVA/TIA and presence of serious comorbidities.  Negative risk factors for bleeding include an age less than 58 years old.  The bleeding index is 'intermediate risk'.  Positive CHADS2 values include History of CHF, History of HTN, History of Diabetes, and Prior Stroke/CVA/TIA.  Negative CHADS2 values include Age > 70 years old.  Her last INR was 2.5 RATIO.  Anticoagulation responsible provider: Aundra Dubin MD, Chanelle Hodsdon.  INR POC: 2.5.  Cuvette Lot#: [id number].  Exp: 06/2011.    Anticoagulation Management Assessment/Plan:      The patient's current anticoagulation dose is Warfarin sodium 4 mg tabs: take as directed, Warfarin sodium 1 mg tabs: Use as directed by Anticoagualtion Clinic.  The target INR is 2.0-3.0.  The next INR is due 07/24/2010.  Anticoagulation instructions were given to patient.  Results were reviewed/authorized by Porfirio Oar, PharmD.  She was notified by Porfirio Oar PharmD.         Prior Anticoagulation Instructions: INR 3.0  Continue same dose of 5mg  daily.  Recheck INR in 3 weeks.   Current Anticoagulation Instructions: INR 2.5  Continue  same dose of 5mg  every day.  Recheck INR in 4 weeks.

## 2010-07-09 NOTE — Miscellaneous (Signed)
Summary: OT summary/[place]  OT summary/[place]   Imported By: Bubba Hales 05/22/2010 07:22:17  _____________________________________________________________________  External Attachment:    Type:   Image     Comment:   External Document

## 2010-07-09 NOTE — Assessment & Plan Note (Signed)
Summary: 1 MTH FU---STC   Vital Signs:  Patient profile:   50 year old female Height:      64 inches (162.56 cm) Weight:      173 pounds (78.64 kg) BMI:     29.80 O2 Sat:      91 % on Room air Temp:     98.4 degrees F (36.89 degrees C) oral Pulse rate:   97 / minute BP sitting:   124 / 82  (left arm) Cuff size:   regular  Vitals Entered By: Rebeca Alert CMA Deborra Medina) (May 22, 2010 1:59 PM)  O2 Flow:  Room air CC: 1 month F/U/aj Is Patient Diabetic? Yes   Primary Provider:  Donavan Foil MD  CC:  1 month F/U/aj.  History of Present Illness: the status of at least 3 ongoing medical problems is addressed today: dm:  no cbg record, but states cbg's are high-170's.  denies hypoglycemia.  she wants the cheapest possible meds.   htn: pt feels catapres is causing depression.   dyslipidemia: pt says she takes a med for cholesterol, but does not know which one.  pt states she feels well in general.  Current Medications (verified): 1)  Cellcept 250 Mg  Caps (Mycophenolate Mofetil) .... Take 1 By Mouth Qd 2)  Dilt-Xr 180 Mg  Cp24 (Diltiazem Hcl) .... Take 1 By Mouth Qd 3)  Folic Acid 1 Mg  Tabs (Folic Acid) .... Take 1 By Mouth Qd 4)  Nexium 40 Mg  Cpdr (Esomeprazole Magnesium) .... Take 1 By Mouth Qd 5)  Prograf 1 Mg  Caps (Tacrolimus) .... Take 1 By Mouth Two Times A Day Qd 6)  Zoloft 100 Mg  Tabs (Sertraline Hcl) .... Take 1 By Mouth Qd 7)  Zinc Sulfate 1 Mg/ml  Soln (Zinc Sulfate) .... Take 1 By Mouth Qd 8)  Prednisone 1 Mg Tabs (Prednisone) .... Take 3 By Mouth Qd 9)  Warfarin Sodium 4 Mg Tabs (Warfarin Sodium) .... Take As Directed 10)  Prodigy Pocket Blood Glucose W/device Kit (Blood Glucose Monitoring Suppl) .... As Dir 11)  Glimepiride 4 Mg Tabs (Glimepiride) .Marland Kitchen.. 1 Tab Each Am 12)  Prodigy No Coding Blood Gluc  Strp (Glucose Blood) .... Once Daily, and Lancets 250.00 13)  Catapres-Tts-1 0.1 Mg/24hr Ptwk (Clonidine Hcl) .Marland Kitchen.. 1 Q Week 14)  Warfarin Sodium 1 Mg Tabs  (Warfarin Sodium) .... Use As Directed By Anticoagualtion Clinic  Allergies (verified): 1)  ! Codeine 2)  ! Percocet 3)  ! Darvocet 4)  ! Sulfa  Past History:  Past Medical History: Last updated: 08/22/2007 Congestive heart failure, dr Andree Elk, [facility] GERD Hyperlipidemia Hypertension, dr Andree El[facility], [facility] Renal insufficiency, dr Andree Elk, [facility] DVT, hx of, dr Andree Elk, [facility] Anticoagulation therapy, dr Andree Elk, [facility] Osteoporosis, rheumatol at [facility] Gout, dr Andree [facility], [facility] depression, dr Andree Elk, [facility]  Review of Systems  The patient denies weight loss and weight gain.    Physical Exam  General:  obese. no distress Neck:  i am not sure i can appreciate the thyroid nodule.   Additional Exam:  Hemoglobin A1C            5.9 %   Impression & Recommendations:  Problem # 1:  DIABETES MELLITUS, TYPE II (ICD-250.00) overcontrolled  Problem # 2:  HYPERLIPIDEMIA (0000000) on uncertain rx  Problem # 3:  HYPERTENSION (ICD-401.9) overcontrolled  Medications Added to Medication List This Visit: 1)  Glimepiride 2 Mg Tabs (Glimepiride) .Marland Kitchen.. 1 tab each am.  this replaces the 4 mg  sent earlier today).  Other Orders: TLB-A1C / Hgb A1C (Glycohemoglobin) (83036-A1C) Est. Patient Level IV [id number])  Patient Instructions: 1)  check your blood sugar 1 time a day.  vary the time of day when you check, between before the 3 meals, and at bedtime.  also check if you have symptoms of your blood sugar being too high or too low.  please keep a record of the readings and bring it to your next appointment here.  please call us sooner if you are having blood sugar less than 80. 2)  Please schedule a follow-up appointment in 1 month.  please bring all of your medication bottles then.   3)  stop catapres patch. 4)  (update: i left message on phone-tree:  reduce amaryl to 2 mg each am) Prescriptions: GLIMEPIRIDE 2 MG TABS (GLIMEPIRIDE) 1 tab each am.  this replaces the 4 mg sent earlier today).  #30  x 3   Entered and Authorized by:   Donavan Foil MD   Signed by:   Donavan Foil MD on 05/22/2010   Method used:   Electronically to        [facility] [phone]* (retail)       [address]       [address]       [place], [place]  [address]       Ph: [phone]       Fax: [phone]   [id number] GLIMEPIRIDE 4 MG TABS (GLIMEPIRIDE) 1 tab each am  #30 x 1   Entered and Authorized by:   Donavan Foil MD   Signed by:   Donavan Foil MD on 05/22/2010   Method used:   Electronically to        [facility] [address] [phone]* (retail)       [address]       [address]       [place], [place]  [address]       Ph: [phone]       Fax: [phone]   RxID:   [id number]    Orders Added: 1)  TLB-A1C / Hgb A1C (Glycohemoglobin) [83036-A1C] 2)  Est. Patient Level IV [id number]

## 2010-07-10 ENCOUNTER — Ambulatory Visit: Payer: PRIVATE HEALTH INSURANCE | Admitting: Occupational Therapy

## 2010-07-10 ENCOUNTER — Ambulatory Visit: Payer: PRIVATE HEALTH INSURANCE | Attending: Endocrinology

## 2010-07-10 DIAGNOSIS — R279 Unspecified lack of coordination: Secondary | ICD-10-CM | POA: Insufficient documentation

## 2010-07-10 DIAGNOSIS — M6281 Muscle weakness (generalized): Secondary | ICD-10-CM | POA: Insufficient documentation

## 2010-07-10 DIAGNOSIS — Z5189 Encounter for other specified aftercare: Secondary | ICD-10-CM | POA: Insufficient documentation

## 2010-07-10 DIAGNOSIS — I69998 Other sequelae following unspecified cerebrovascular disease: Secondary | ICD-10-CM | POA: Insufficient documentation

## 2010-07-10 DIAGNOSIS — I69920 Aphasia following unspecified cerebrovascular disease: Secondary | ICD-10-CM | POA: Insufficient documentation

## 2010-07-13 ENCOUNTER — Encounter: Payer: Self-pay | Admitting: Occupational Therapy

## 2010-07-13 ENCOUNTER — Encounter: Payer: PRIVATE HEALTH INSURANCE | Admitting: Occupational Therapy

## 2010-07-14 ENCOUNTER — Ambulatory Visit: Payer: PRIVATE HEALTH INSURANCE | Admitting: Occupational Therapy

## 2010-07-14 ENCOUNTER — Ambulatory Visit: Payer: PRIVATE HEALTH INSURANCE

## 2010-07-16 ENCOUNTER — Ambulatory Visit: Payer: PRIVATE HEALTH INSURANCE

## 2010-07-16 ENCOUNTER — Ambulatory Visit: Payer: PRIVATE HEALTH INSURANCE | Admitting: Occupational Therapy

## 2010-07-16 DIAGNOSIS — I2699 Other pulmonary embolism without acute cor pulmonale: Secondary | ICD-10-CM | POA: Insufficient documentation

## 2010-07-16 DIAGNOSIS — I6350 Cerebral infarction due to unspecified occlusion or stenosis of unspecified cerebral artery: Secondary | ICD-10-CM

## 2010-07-16 HISTORY — DX: Other pulmonary embolism without acute cor pulmonale: I26.99

## 2010-07-21 ENCOUNTER — Ambulatory Visit: Payer: PRIVATE HEALTH INSURANCE | Admitting: Occupational Therapy

## 2010-07-21 ENCOUNTER — Ambulatory Visit: Payer: PRIVATE HEALTH INSURANCE

## 2010-07-23 ENCOUNTER — Encounter: Payer: Self-pay | Admitting: Endocrinology

## 2010-07-23 ENCOUNTER — Ambulatory Visit: Payer: PRIVATE HEALTH INSURANCE | Admitting: Occupational Therapy

## 2010-07-23 ENCOUNTER — Ambulatory Visit: Payer: PRIVATE HEALTH INSURANCE

## 2010-07-28 ENCOUNTER — Encounter: Payer: PRIVATE HEALTH INSURANCE | Admitting: Occupational Therapy

## 2010-07-30 ENCOUNTER — Encounter: Payer: Self-pay | Admitting: Internal Medicine

## 2010-07-30 ENCOUNTER — Encounter (INDEPENDENT_AMBULATORY_CARE_PROVIDER_SITE_OTHER): Payer: PRIVATE HEALTH INSURANCE

## 2010-07-30 DIAGNOSIS — I2699 Other pulmonary embolism without acute cor pulmonale: Secondary | ICD-10-CM

## 2010-07-30 DIAGNOSIS — I8010 Phlebitis and thrombophlebitis of unspecified femoral vein: Secondary | ICD-10-CM

## 2010-07-30 DIAGNOSIS — Z7901 Long term (current) use of anticoagulants: Secondary | ICD-10-CM

## 2010-07-30 DIAGNOSIS — I6789 Other cerebrovascular disease: Secondary | ICD-10-CM

## 2010-07-30 LAB — CONVERTED CEMR LAB: POC INR: 2.5

## 2010-07-31 ENCOUNTER — Encounter: Payer: PRIVATE HEALTH INSURANCE | Admitting: Occupational Therapy

## 2010-07-31 ENCOUNTER — Ambulatory Visit: Payer: PRIVATE HEALTH INSURANCE

## 2010-08-03 ENCOUNTER — Encounter: Payer: Self-pay | Admitting: Endocrinology

## 2010-08-04 NOTE — Medication Information (Signed)
Summary: ccr/tmj  Anticoagulant Therapy  Managed by: Danella Penton, RN PCP: Donavan Foil MD Supervising MD: Lovena Le MD, Carleene Overlie Indication 1: DVT/PE (recurrent or continuing risk factors) Indication 2: Cerebrovascular Accident Lab Used: [facility] [place] Site: [place] INR POC 2.5 INR RANGE 2-3  Dietary changes: no    Health status changes: no    Bleeding/hemorrhagic complications: no    Recent/future hospitalizations: no    Any changes in medication regimen? no    Recent/future dental: no  Any missed doses?: no       Is patient compliant with meds? yes       Allergies: 1)  ! Codeine 2)  ! Percocet 3)  ! Darvocet 4)  ! Sulfa  Anticoagulation Management History:      The patient is taking warfarin and comes in today for a routine follow up visit.  Positive risk factors for bleeding include history of CVA/TIA and presence of serious comorbidities.  Negative risk factors for bleeding include an age less than 81 years old.  The bleeding index is 'intermediate risk'.  Positive CHADS2 values include History of CHF, History of HTN, History of Diabetes, and Prior Stroke/CVA/TIA.  Negative CHADS2 values include Age > 90 years old.  Her last INR was 2.5 RATIO.  Anticoagulation responsible provider: Lovena Le MD, Carleene Overlie.  INR POC: 2.5.  Cuvette Lot#: [id number].  Exp: 07/2011.    Anticoagulation Management Assessment/Plan:      The patient's current anticoagulation dose is Warfarin sodium 4 mg tabs: take as directed, Warfarin sodium 1 mg tabs: Use as directed by Anticoagualtion Clinic.  The target INR is 2.0-3.0.  The next INR is due 08/27/2010.  Anticoagulation instructions were given to patient.  Results were reviewed/authorized by Danella Penton, RN.  She was notified by Danella Penton, RN.         Prior Anticoagulation Instructions: INR 2.5  Continue same dose of 5mg  every day.  Recheck INR in 4 weeks.   Current Anticoagulation Instructions: INR 2.5 Continue taking 5  mg each day. Recheck in 4 weeks.

## 2010-08-04 NOTE — Letter (Signed)
Summary: [facility]   Imported By: Phillis Knack 07/28/2010 09:44:22  _____________________________________________________________________  External Attachment:    Type:   Image     Comment:   External Document

## 2010-08-13 NOTE — Miscellaneous (Signed)
Summary: Discharged from [facility]  Discharged from [place] By: Phillis Knack 08/03/2010 11:17:07  _____________________________________________________________________  External Attachment:    Type:   Image     Comment:   External Document

## 2010-08-19 LAB — GLUCOSE, CAPILLARY
Glucose-Capillary: 113 mg/dL — ABNORMAL HIGH (ref 70–99)
Glucose-Capillary: 115 mg/dL — ABNORMAL HIGH (ref 70–99)
Glucose-Capillary: 117 mg/dL — ABNORMAL HIGH (ref 70–99)
Glucose-Capillary: 121 mg/dL — ABNORMAL HIGH (ref 70–99)
Glucose-Capillary: 130 mg/dL — ABNORMAL HIGH (ref 70–99)
Glucose-Capillary: 139 mg/dL — ABNORMAL HIGH (ref 70–99)
Glucose-Capillary: 140 mg/dL — ABNORMAL HIGH (ref 70–99)
Glucose-Capillary: 150 mg/dL — ABNORMAL HIGH (ref 70–99)
Glucose-Capillary: 156 mg/dL — ABNORMAL HIGH (ref 70–99)
Glucose-Capillary: 162 mg/dL — ABNORMAL HIGH (ref 70–99)
Glucose-Capillary: 83 mg/dL (ref 70–99)
Glucose-Capillary: 84 mg/dL (ref 70–99)
Glucose-Capillary: 85 mg/dL (ref 70–99)
Glucose-Capillary: 91 mg/dL (ref 70–99)
Glucose-Capillary: 102 mg/dL — ABNORMAL HIGH (ref 70–99)
Glucose-Capillary: 110 mg/dL — ABNORMAL HIGH (ref 70–99)
Glucose-Capillary: 112 mg/dL — ABNORMAL HIGH (ref 70–99)
Glucose-Capillary: 125 mg/dL — ABNORMAL HIGH (ref 70–99)
Glucose-Capillary: 127 mg/dL — ABNORMAL HIGH (ref 70–99)
Glucose-Capillary: 127 mg/dL — ABNORMAL HIGH (ref 70–99)
Glucose-Capillary: 130 mg/dL — ABNORMAL HIGH (ref 70–99)
Glucose-Capillary: 140 mg/dL — ABNORMAL HIGH (ref 70–99)
Glucose-Capillary: 140 mg/dL — ABNORMAL HIGH (ref 70–99)
Glucose-Capillary: 145 mg/dL — ABNORMAL HIGH (ref 70–99)
Glucose-Capillary: 167 mg/dL — ABNORMAL HIGH (ref 70–99)
Glucose-Capillary: 191 mg/dL — ABNORMAL HIGH (ref 70–99)
Glucose-Capillary: 72 mg/dL (ref 70–99)
Glucose-Capillary: 72 mg/dL (ref 70–99)
Glucose-Capillary: 81 mg/dL (ref 70–99)
Glucose-Capillary: 82 mg/dL (ref 70–99)
Glucose-Capillary: 83 mg/dL (ref 70–99)
Glucose-Capillary: 84 mg/dL (ref 70–99)
Glucose-Capillary: 85 mg/dL (ref 70–99)
Glucose-Capillary: 87 mg/dL (ref 70–99)
Glucose-Capillary: 89 mg/dL (ref 70–99)
Glucose-Capillary: 91 mg/dL (ref 70–99)
Glucose-Capillary: 95 mg/dL (ref 70–99)
Glucose-Capillary: 97 mg/dL (ref 70–99)

## 2010-08-19 LAB — COMPREHENSIVE METABOLIC PANEL
ALT: 16 U/L (ref 0–35)
AST: 23 U/L (ref 0–37)
Albumin: 3.4 g/dL — ABNORMAL LOW (ref 3.5–5.2)
Alkaline Phosphatase: 78 U/L (ref 39–117)
BUN: 13 mg/dL (ref 6–23)
CO2: 22 mEq/L (ref 19–32)
Calcium: 9.2 mg/dL (ref 8.4–10.5)
Chloride: 109 mEq/L (ref 96–112)
Creatinine, Ser: 0.66 mg/dL (ref 0.4–1.2)
GFR calc Af Amer: >60 mL/min (ref >60)
GFR calc non Af Amer: >60 mL/min (ref >60)
Glucose, Bld: 96 mg/dL (ref 70–99)
Potassium: 3.8 mEq/L (ref 3.5–5.1)
Sodium: 140 mEq/L (ref 135–145)
Total Bilirubin: 0.5 mg/dL (ref 0.3–1.2)
Total Protein: 6.5 g/dL (ref 6.0–8.3)

## 2010-08-19 LAB — CBC
HCT: 38.6 % (ref 36.0–46.0)
HCT: 40.8 % (ref 36.0–46.0)
Hemoglobin: 12.5 g/dL (ref 12.0–15.0)
Hemoglobin: 13.5 g/dL (ref 12.0–15.0)
MCH: 29.3 pg (ref 26.0–34.0)
MCH: 29.9 pg (ref 26.0–34.0)
MCHC: 32.4 g/dL (ref 30.0–36.0)
MCHC: 33.1 g/dL (ref 30.0–36.0)
MCV: 90.6 fL (ref 78.0–100.0)
MCV: 90.5 fL (ref 78.0–100.0)
Platelets: 130 10*3/uL — ABNORMAL LOW (ref 150–400)
Platelets: 115 10*3/uL — ABNORMAL LOW (ref 150–400)
RBC: 4.26 MIL/uL (ref 3.87–5.11)
RBC: 4.51 MIL/uL (ref 3.87–5.11)
RDW: 13.5 % (ref 11.5–15.5)
RDW: 13.5 % (ref 11.5–15.5)
WBC: 7.9 10*3/uL (ref 4.0–10.5)
WBC: 6.5 10*3/uL (ref 4.0–10.5)

## 2010-08-19 LAB — URINE MICROSCOPIC-ADD ON

## 2010-08-19 LAB — PROTIME-INR
INR: 1.31 (ref 0.00–1.49)
INR: 1.56 — ABNORMAL HIGH (ref 0.00–1.49)
INR: 1.73 — ABNORMAL HIGH (ref 0.00–1.49)
INR: 2.08 — ABNORMAL HIGH (ref 0.00–1.49)
INR: 2.56 — ABNORMAL HIGH (ref 0.00–1.49)
INR: 1 (ref 0.00–1.49)
INR: 1.03 (ref 0.00–1.49)
INR: 1.08 (ref 0.00–1.49)
INR: 1.2 (ref 0.00–1.49)
INR: 1.23 (ref 0.00–1.49)
INR: 1.32 (ref 0.00–1.49)
Prothrombin Time: 16.5 seconds — ABNORMAL HIGH (ref 11.6–15.2)
Prothrombin Time: 18.9 seconds — ABNORMAL HIGH (ref 11.6–15.2)
Prothrombin Time: 20.4 seconds — ABNORMAL HIGH (ref 11.6–15.2)
Prothrombin Time: 23.5 seconds — ABNORMAL HIGH (ref 11.6–15.2)
Prothrombin Time: 27.6 seconds — ABNORMAL HIGH (ref 11.6–15.2)
Prothrombin Time: 13.4 seconds (ref 11.6–15.2)
Prothrombin Time: 13.7 seconds (ref 11.6–15.2)
Prothrombin Time: 14.2 seconds (ref 11.6–15.2)
Prothrombin Time: 15.4 seconds — ABNORMAL HIGH (ref 11.6–15.2)
Prothrombin Time: 15.7 seconds — ABNORMAL HIGH (ref 11.6–15.2)
Prothrombin Time: 16.6 seconds — ABNORMAL HIGH (ref 11.6–15.2)

## 2010-08-19 LAB — BASIC METABOLIC PANEL
BUN: 15 mg/dL (ref 6–23)
CO2: 20 mEq/L (ref 19–32)
Calcium: 9.3 mg/dL (ref 8.4–10.5)
Chloride: 109 mEq/L (ref 96–112)
Creatinine, Ser: 0.59 mg/dL (ref 0.4–1.2)
GFR calc Af Amer: >60 mL/min (ref >60)
GFR calc non Af Amer: >60 mL/min (ref >60)
Glucose, Bld: 82 mg/dL (ref 70–99)
Potassium: 4.2 mEq/L (ref 3.5–5.1)
Sodium: 139 mEq/L (ref 135–145)

## 2010-08-19 LAB — DIFFERENTIAL
Basophils Absolute: 0 10*3/uL (ref 0.0–0.1)
Basophils Relative: 1 % (ref 0–1)
Eosinophils Absolute: 0.1 10*3/uL (ref 0.0–0.7)
Eosinophils Relative: 2 % (ref 0–5)
Lymphocytes Relative: 55 % — ABNORMAL HIGH (ref 12–46)
Lymphs Abs: 3.6 10*3/uL (ref 0.7–4.0)
Monocytes Absolute: 0.6 10*3/uL (ref 0.1–1.0)
Monocytes Relative: 9 % (ref 3–12)
Neutro Abs: 2.2 10*3/uL (ref 1.7–7.7)
Neutrophils Relative %: 33 % — ABNORMAL LOW (ref 43–77)

## 2010-08-19 LAB — URINALYSIS, ROUTINE W REFLEX MICROSCOPIC
Bilirubin Urine: NEGATIVE
Glucose, UA: NEGATIVE mg/dL
Hgb urine dipstick: NEGATIVE
Ketones, ur: NEGATIVE mg/dL
Nitrite: NEGATIVE
Protein, ur: NEGATIVE mg/dL
Specific Gravity, Urine: 1.018 (ref 1.005–1.030)
Urobilinogen, UA: 1 mg/dL (ref 0.0–1.0)
pH: 6.5 (ref 5.0–8.0)

## 2010-08-19 LAB — CK TOTAL AND CKMB (NOT AT ARMC)
CK, MB: 0.9 ng/mL (ref 0.3–4.0)
Relative Index: INVALID (ref 0.0–2.5)
Total CK: 41 U/L (ref 7–177)

## 2010-08-19 LAB — POCT I-STAT, CHEM 8
BUN: 20 mg/dL (ref 6–23)
Calcium, Ion: 1.18 mmol/L (ref 1.12–1.32)
Chloride: 108 mEq/L (ref 96–112)
Creatinine, Ser: 0.8 mg/dL (ref 0.4–1.2)
Glucose, Bld: 94 mg/dL (ref 70–99)
HCT: 42 % (ref 36.0–46.0)
Hemoglobin: 14.3 g/dL (ref 12.0–15.0)
Potassium: 5.5 mEq/L — ABNORMAL HIGH (ref 3.5–5.1)
Sodium: 139 mEq/L (ref 135–145)
TCO2: 26 mmol/L (ref 0–100)

## 2010-08-19 LAB — TROPONIN I: Troponin I: 0.02 ng/mL (ref 0.00–0.06)

## 2010-08-19 LAB — LIPID PANEL
Cholesterol: 210 mg/dL — ABNORMAL HIGH (ref 0–200)
HDL: 50 mg/dL (ref >39)
LDL Cholesterol: 136 mg/dL — ABNORMAL HIGH (ref 0–99)
Total CHOL/HDL Ratio: 4.2 RATIO
Triglycerides: 120 mg/dL (ref <150)
VLDL: 24 mg/dL (ref 0–40)

## 2010-08-19 LAB — HEMOGLOBIN A1C
Hgb A1c MFr Bld: 5.9 % — ABNORMAL HIGH (ref <5.7)
Mean Plasma Glucose: 123 mg/dL — ABNORMAL HIGH (ref <117)

## 2010-08-19 LAB — APTT: aPTT: 35 seconds (ref 24–37)

## 2010-08-27 ENCOUNTER — Encounter: Payer: PRIVATE HEALTH INSURANCE | Admitting: *Deleted

## 2010-08-28 ENCOUNTER — Ambulatory Visit (INDEPENDENT_AMBULATORY_CARE_PROVIDER_SITE_OTHER): Payer: PRIVATE HEALTH INSURANCE | Admitting: *Deleted

## 2010-08-28 DIAGNOSIS — Z7901 Long term (current) use of anticoagulants: Secondary | ICD-10-CM

## 2010-08-28 DIAGNOSIS — I6350 Cerebral infarction due to unspecified occlusion or stenosis of unspecified cerebral artery: Secondary | ICD-10-CM

## 2010-08-28 DIAGNOSIS — I2699 Other pulmonary embolism without acute cor pulmonale: Secondary | ICD-10-CM

## 2010-08-28 LAB — POCT INR: INR: 2.3

## 2010-08-28 NOTE — Patient Instructions (Signed)
INR 2.3 Pt. Is currently taking 4 mg AND 1 mg tablets of coumadin for a total of 5 mg daily. Continue taking 5 mg of coumadin daily. Recheck in 4 weeks.

## 2010-09-04 ENCOUNTER — Encounter: Payer: Self-pay | Admitting: Endocrinology

## 2010-09-25 ENCOUNTER — Encounter: Payer: PRIVATE HEALTH INSURANCE | Admitting: *Deleted

## 2010-09-28 ENCOUNTER — Ambulatory Visit (INDEPENDENT_AMBULATORY_CARE_PROVIDER_SITE_OTHER): Payer: PRIVATE HEALTH INSURANCE | Admitting: *Deleted

## 2010-09-28 DIAGNOSIS — I2699 Other pulmonary embolism without acute cor pulmonale: Secondary | ICD-10-CM

## 2010-09-28 DIAGNOSIS — I6350 Cerebral infarction due to unspecified occlusion or stenosis of unspecified cerebral artery: Secondary | ICD-10-CM

## 2010-09-28 LAB — POCT INR: INR: 2.1

## 2010-10-20 NOTE — H&P (Signed)
Connie Ruiz, Connie Ruiz                  ACCOUNT NO.:  [id number]   MEDICAL RECORD NO.:  [id number]          PATIENT TYPE:  EMS   LOCATION:  ED                           FACILITY[facility]  [facility]   PHYSICIAN:  Leonel Ramsay, MD DATE OF BIRTH:  [birth year]   DATE OF ADMISSION:  08/13/2007  DATE OF DISCHARGE:                              HISTORY & PHYSICAL   CHIEF COMPLAINT:  Fevers, chills, cough, and myalgias.   HISTORY OF PRESENT ILLNESS:  This is a very pleasant 50 year old African-  American female with complicated medical history including lupus, end-  stage renal disease status post renal transplant in November of 2005,  prior CVA's, prior history of DVT, who was in her usual state of health  until three days prior to admission.  She said three days ago she first  developed some shaking chills and then some joint and muscle aches.  She  also had fevers beginning two nights ago with temperature to 100 degrees  followed by 101 degrees the next day.  She was having some headaches and  pain behind her eyes like the flu.  She also reports some chest  congestion and cough with yellow mucous.  She has had some mild dyspnea  on exertion, but no chest pain.  She also has had some sore throat.  She  reports some mild abdominal pain in the midline as well as some nausea  and vomiting x1 and diarrhea x3 without any blood.  She says this has  been getting progressively worse.  She denies any rashes, dysuria,  hematuria, or any new weakness or neurological symptoms.   PAST MEDICAL HISTORY:  1. 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      Andree Elk at [facility]  2. History of lupus.  3. End-stage renal disease who was on chronic dialysis until her      kidney transplant.  4. History of Clostridium difficile colitis in 2004.  5. History of gout.  6. History of hypertension.  7. Adult onset diabetes mellitus which is diet controlled.  8. 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  9. History of deep venous thrombosis, her last one in 2005 and she has      been on Coumadin since then.  10.History of congestive heart failure.  11.History of osteoporosis.  12.GERD.  13.Dyslipidemia.  14.Status post cholecystectomy.  15.Status post tubal ligation.   SOCIAL HISTORY:  The patient lives alone.  She has a daughter who lives  in the area with her.   FAMILY HISTORY:  Noncontributory.   REVIEW OF SYSTEMS:  11 systems reviewed and are negative except as per  HPI.   MEDICATIONS:  The patient was able to tell me her medications from  memory.  She reports being on:  1. Cellcept 250 mg twice a day.  2. Diltiazem 180 mg once a day.  3. Nexium 40 mg once a day.  4. Phenergan as needed.  5. Prednisone 4 mg once a day.  6.  Prograf 1 mg twice a day.  7. Sertraline 100 mg once a day.  8. Warfarin 6 mg daily.  9. Zinc sulfate.  10.Colace.  11.Folic acid.  12.Multivitamin.   ALLERGIES:  AVANDIA, CODEINE, LATEX, PERCOCET, SULFA DRUGS.   PHYSICAL EXAMINATION:  GENERAL:  The patient is an African-American  female who appears somewhat diaphoretic and in mild distress.  VITAL SIGNS:  Her temperature was 102.7, pulse 123 down to 112, blood  pressure 104/64, respirations 20, saturations 94-97% on room air.  HEENT:  Pupils equal, round, and reactive to light and accommodation.  Her extraocular movements are intact.  She does appear to have some mild  proptosis bilaterally.  Sclerae anicteric.  Oropharynx, there is a white  coating on her tongue which does not appear to be thrush.  Her dentition  is in moderate disrepair.  Her oropharynx is otherwise clear.  NECK:  Supple, no anterior cervical, posterior cervical, supraclavicular  lymphadenopathy.  HEART:  Regular rate and rhythm.  LUNGS:  She does have bilateral rhonchi at the bases.  ABDOMEN:  Soft, nontender, nondistended.  No hepatosplenomegaly.  EXTREMITIES:  No cyanosis,  clubbing, or edema.  Joints; no obvious  marked edema, erythema, or effusion of any joints.  NEUROLOGY:  She is alert and oriented x3.  She has mild right-sided  weakness, but is otherwise intact.   LABORATORY DATA:  The patient had blood work done and has a white blood  cell count of 15.0 with a percent neutrophil 72%, absolute neutrophil  count 10.8, hemoglobin 13.7, platelets 145.  Sodium 139, potassium 4.0,  chloride 108, bicarb 23, BUN 8, creatinine 0.73, glucose 93, calcium  9.1.  UA is with a specific gravity of 1.038, negative for nitrites and  leukocyte esterase with 0-2 white cells.  She had a chest x-ray done  which is relatively clear without evidence of marked infiltrate.   IMPRESSION:  This is a 50 year old woman now three years status post  renal transplant who is maintained on immunosuppressives who presents  with a three-day history of fevers, myalgias, and cough.  She does have  an elevated white blood cell count of 15 and a fever to 102.7, however,  her chest x-ray is clear.  Her urinalysis is also benign.  She has no  other concerning abdominal findings to suggest an intra-abdominal  process.  She is neurologically intact, does not report impressive  headache.   I think the most likely differential on her includes viral illness  including the flu versus pneumonia.  Given her immunosuppression, she  could have basic community acquired pneumonia, however, she is at risk  for opportunistic infections especially fungal pneumonia such as  Cryptococcus or a PCP pneumonia.   PLAN:  1. We have swabbed her for the flu and she has also had blood cultures      done prior to antibiotics.  Sputum culture was obtained, although      she had received Ceftriaxone prior to that.  She also has a urine      culture pending.  We will start her on Ceftriaxone and Azithromycin      to cover her for community acquired pneumonia.  Pending results of      culture, her response to these  antibiotics, she may need further      workup including possibly a CT of her chest which could be done      without contrast or further serological testing with Cryptococcal  antigen and also induced sputum for PCP.  For now, we will see how      she responds in the next 24 hours to empiric antibiotic therapy.  2. Immunosuppression.  I have placed the patient back on her Cellcept,      Prograf, and prednisone.  3. Deep venous thrombosis.  The patient is on Coumadin and I have      placed her back on that, however, she needs to have a PT/INR done      which I have ordered.  Potentially consider holding her      anticoagulation or switching her to Lovenox while inpatient.  4. Hypertension.  We will continue her on Diltiazem.  5. Gastroesophageal reflux disease.  We will continue her on Nexium.  6. Prophylaxis.  The patient is already anticoagulated, so does not      need further deep venous thrombosis prophylaxis.  She is also on      Nexium for gastrointestinal prophylaxis.  I will check her finger      sticks given that she is on prednisone.  7. FEN.  We will give the patient IV rehydration with normal saline      and monitor her electrolytes.   DISPOSITION:  The patient will be evaluated following empiric antibiotic  treatment and pending results of her microbiological studies.      Leonel Ramsay, MD  Electronically Signed     DPF/MEDQ  D:  08/14/2007  T:  08/14/2007  Job:  [id number]

## 2010-10-20 NOTE — Discharge Summary (Signed)
Connie Ruiz, Connie Ruiz                  ACCOUNT NO.:  [id number]   MEDICAL RECORD NO.:  [id number]          PATIENT TYPE:  INP   LOCATION:  79                         FAC[facility]   PHYSICIAN:  Valerie A. Asa Lente, MDDATE OF BIRTH:  11/30/1960   DATE OF ADMISSION:  08/13/2007  DATE OF DISCHARGE:  08/17/2007                               DISCHARGE SUMMARY   DISCHARGE DIAGNOSES:  1. Fever and cough in immunosuppressed patient likely secondary to      bronchitis.  2. Urinary tract infection with positive UA on admit.  3. Mild hypokalemia.   HISTORY OF PRESENT ILLNESS:  Connie Ruiz is a 50 year old, African-  American female status post renal transplant in 2005 also with history  of lupus and prior CVAs and prior history of DVT who presented to the  emergency room on day of admission with a 3-day history of fever and  chills with muscle aches, chest congestion and productive cough of  yellow sputum. She stated symptoms have become progressively worse. Upon  evaluation in the emergency room, the patient found to have a white cell  count of 15.0 with a 72% left shift and an ANC of 10.8. Chest x-ray done  on admission was clear without any evidence of infiltrate. Secondary to  the patient's complex medical history, she was admitted to the hospital  for further evaluation and treatment.   PAST MEDICAL HISTORY:  1. History of lupus.  2. End-stage renal disease. Prior hemodialysis until renal transplant      in November 2005.  3. History of C diff colitis in 2004.  4. History of gout.  5. Hypertension.  6. Diet-controlled diabetes mellitus.  7. 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  8. 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  9. History of CHF..  10.History of osteoporosis.  11.GERD.  12.Dyslipidemia.  13.Status post cholecystectomy.  14.Bilateral tubal ligation.   HOSPITAL COURSE:  1. Fever and cough in immunosuppressed patient.  The patient's symptoms      thought likely secondary to bronchitic infection along with      positive UA on admit. The patient was placed on empiric Rocephin      and azithromycin, scheduled nebulizer treatments in addition to an      antitussive treatment. Oxygen saturations remained stable      throughout the hospitalization.  Chest x-ray clear with no signs of      infiltrate.  Blood cultures were negative for any growth. At the      time of discharge, the patient has remained afebrile greater than      48 hours. White cell count has trended downwards from 15.0 to 7.00      at time of discharge. The patient is to continue on antibiotic      treatment for a total of 10 days.  She is sent home with a cough      suppressant. In addition, she is scheduled for follow up with her      primary care physician while  still on antibiotic treatment. There      were no signs of a lupus flare during this hospitalization.  2. Urinary tract infection with positive UA on admit. The patient      denied any dysuria. Antibiotic treatment for problem #1 to cover      for UTI.  3. Mild hypokalemia.  The patient is status post p.o. repletion.   DISCHARGE MEDICATIONS:  1. Ceftin 500 mg tab p.o. b.i.d. until gone.  2. Zithromax 100 mg p.o. daily until gone.  3. Tussionex 10 mg in 5 mL 1 teaspoon q.12 h p.r.n. cough.  4. CellCept 250 mg p.o. daily.  5. Diltiazem 180 mg p.o. daily.  6. Folic acid 1 mg p.o. daily.  7. Nexium 40 mg p.o. daily.  8. Prednisone 4 mg p.o. daily.  9. Prograf 1 mg p.o. b.i.d.  10.Zoloft 100 mg p.o. daily.  11.Coumadin 6 mg p.o. daily.  12.Zinc sulfate 1 tablet p.o. daily.   PERTINENT LAB WORK AT TIME OF DISCHARGE:  White cell count 7.0, platelet  count 123, hemoglobin 11.8, hematocrit 33.8, INR 2.2. Creatinine 0.70,  sodium 138, potassium 3.6.   DISPOSITION:  The patient felt medically stable for discharge home at  this time. She is instructed to follow up with her primary  care  physician, Dr. Renato Shin, on March 17 at 1:45 p.m.  She is instructed  to return to the ER should she experience any increased fever or  shortness of breath.      Connie Ranks, NP      Jannifer Rodney. Asa Lente, MD  Electronically Signed    LE/MEDQ  D:  08/17/2007  T:  08/18/2007  Job:  [id number]   cc:   Hilliard Clark A. Loanne Drilling, [place]  [place] [address]

## 2010-10-23 NOTE — Consult Note (Signed)
Connie Ruiz, Connie Ruiz                            ACCOUNT NO.:  [id number]   MEDICAL RECORD NO.:  [id number]                   PATIENT TYPE:  INP   LOCATION:  5531                                 FACILITY:  [facility]   PHYSICIAN:  Sammuel Hines. Daiva Nakayama, M.D.              DATE OF BIRTH:  Apr 21, 1961   DATE OF CONSULTATION:  DATE OF DISCHARGE:                                   CONSULTATION   REFERRING PHYSICIAN:  Dr. Hilliard Clark A. Loanne Drilling.   CHIEF COMPLAINT:  Abdominal pain.   HISTORY OF PRESENT ILLNESS:  The patient is a 50 year old black female with  a significant number of medical problems including morbid obesity, renal  failure, congestive heart failure, who presents with a one-month history of  sort of lower abdominal pain, pain she describes as crampy with some  intermittent diarrhea.  She denies any significant fevers or chills.  She  otherwise currently denies any chest pains or shortness of breath.  The rest  of her review of systems is unremarkable.   PAST MEDICAL HISTORY:  Her past medical history is significant for morbid  obesity, systemic lupus erythematosus, renal failure, hypertension,  diabetes, gout, stroke, osteoporosis, congestive heart failure, proptosis,  gastroesophageal reflux, dyslipidemia, spinal osteoarthritis, fibromyalgia  and she is a [name] and receives no blood products.   PAST SURGICAL HISTORY:  Past surgical history is significant for  cholecystectomy and tubal ligation.   MEDICATIONS:  1. Prednisone 6 mg a day.  2. Pravachol 10 mg a day.  3. Protonix 40 mg a day.  4. Normodyne 300 mg twice a day.  5. Zoloft 100 mg twice a day.  6. Norvasc 10 mg a day.  7. Nephro-Vite.   SOCIAL HISTORY:  She denies any use of alcohol or tobacco products.   FAMILY HISTORY:  Her family history is noncontributory.   PHYSICAL EXAMINATION:  GENERAL:  In general, she is a well-developed, well-  nourished black female in no acute distress.  SKIN:  Her skin is warm  and dry with no jaundice.  EYES:  Her extraocular movements are intact.  Pupils are equal, round and  reactive to light.  LUNGS:  Her lungs are clear to auscultation bilaterally.  HEART:  Heart has a regular rate and rhythm.  ABDOMEN:  Abdomen is soft with some mild lower abdominal tenderness but no  guarding or peritoneal signs.  She has a small subcutaneous mass in the  right upper quadrant that is mobile.  There is no redness or fluctuance  associated with this, but it is tender to manipulation.  EXTREMITIES:  There is no cyanosis, clubbing or edema.  PSYCHOLOGICAL:  She is alert and oriented x3.   LABORATORY DATA:  On review of her lab work studies, her white count is  elevated at around 16,000.   ASSESSMENT AND PLAN:  This is a 50 year old black female with multiple  medical problems who has a one-month history of some mild-to-moderate  abdominal pain and some diarrhea and on exam, she has no evidence of sepsis  or peritonitis.  It is not clear what the etiology of her abdominal pain is.  I agree with ruling out a Clostridium difficile colitis, given her recent  history of antibiotic use, and given her history, she certainly could have  some sort of vascular source of the pain.  I agree with treating her with  broad-spectrum antibiotics and she may require a CT scan for further  evaluation of her abdominal pain.  We will follow her closely with you.   Again, thank you for allowing [place] to help with the care of this patient.                                                Sammuel Hines. Daiva Nakayama, M.D.    PST/MEDQ  D:  09/06/2002  T:  09/08/2002  Job:  [id number]

## 2010-10-23 NOTE — Discharge Summary (Signed)
[facility]  Patient:    Connie Ruiz, Connie Ruiz                         MRN: [id number] Adm. Date:  [id number] Disc. Date: 09/13/00 Attending:  Rosezetta Schlatter                           Discharge Summary  TRANSFER NOTE  Patient was admitted September 12, 2000 for mental status changes with speech difficulty manifesting as an expressive aphasia and right-sided weakness. Patient also has long-standing renal insufficiency with a creatinine on admission of 6.0.  Patient also with systemic lupus erythematosus with multiple sequelae.  Patient also with a history of tophaceous gout.  CONSULTANTS:  Dr. Wyline Copas saw the patient in consultation for neurology.  PROCEDURE:  Patient had CT scan of the brain which revealed old left parietal infarct with mild atrophy with no acute change.  Chest x-ray was unremarkable. Patient had an MRI/MRA performed on April 8 with a preliminary report reading no acute ischemic disease, no change from the patients old MCA infarct on previous study, old bilateral cerebellar infarcts.  There was a suggestion of severe focal stenosis of the left MCA with a 40-50% stenosis of the right MCA junction.  HOSPITAL COURSE:  #1 - The patient was admitted to regular bed and then switched to telemetry for monitoring.  She was continued on her home medications of aspirin and Ticlid.  She was seen in consultation by Dr. Gaynell Face for neurology.  His note is attached.  Patient underwent MRI/MRA as noted.  Patient did have some improvement in her speech pattern during her hospital stay.  Patient was seen by speech pathology and occupational therapy for initial evaluation.  A dysphagia 3 diet with thin liquids was recommended because of the patients swallowing.  OT had also seen the patient and felt that she had no significant ambulatory problems and no OT problems, but did have some mild right-sided weakness.  Patient was considered to be stable  from a neurologic perspective and a consultant had recommended no new medications or changes in her therapy.  #2 - RHEUMATOLOGY:  The patient with lupus as noted.  Laboratory was ordered and is pending at this time and includes a repeat ANA and double stranded DNA. Sedimentation rate also ordered and pending in order to evaluate for possible flare of her lupus.  Patient was otherwise continued on her home medications.  #3 - DIABETES:  Patient has been treated with sliding scale and Humalog during her hospitalization because of poor p.o. intake.  I suspect that once she is taking food she will be able to resume her prior medical regimen.  #4 - CHRONIC RENAL FAILURE WITH CHRONIC ANEMIA:  The patient does have an established relationship with Dr. Jerlyn Ly with [facility].  She evidently has had a progression of her renal failure now with a creatinine of 6.0.  This has evidently significantly progressed from when she was last seen at [facility].  She would be a good candidate for further evaluation for chronic deterioration.  She also was scheduled for transplant evaluation.  The patient did complain at todays examination of diffuse pain, especially in the substernal region.  She also had complained of diffuse pain throughout her abdomen and extremities.  Patient reports in the past she has been treated with MS Contin for pain control and management.  PHYSICAL EXAMINATION  VITAL SIGNS:  Temperature 98.7, blood pressure 143/92, pulse 98, respirations 20.  CBGs were running 100, 96, 64.  GENERAL:  This is an obese black female lying in bed who is uncomfortable, but in no acute distress.  CHEST:  Clear with no rales, wheezes, or rhonchi.  CARDIOVASCULAR:  Radial pulse 2+.  Regular rate and rhythm.  I appreciated no murmurs.  ABDOMEN:  Massively obese.  Tender at the epigastrium.  EXTREMITIES:  Patient had no swelling or erythema of any major joint.  DERMATOLOGIC:   Patient has multiple erythematous nodules that are quite tender.  LABORATORIES:  Basic metabolic panel with sodium 142, potassium 4, chloride 117, CO2 17, BUN 93, creatinine 6.0, glucose 85.  DISCHARGE MEDICATIONS:  1. ______ 50 mg q.d.  2. Labetalol 200 mg b.i.d.  3. Norvasc 10 mg q.d.  4. Aspirin 325 mg q.d.  5. Ticlid 250 b.i.d.  6. Catapres patch 0.3 mg q.24h. applied every seven days.  7. Colchicine 0.6 b.i.d.  8. ______ 500 mg b.i.d.  9. Protonix 40 mg q.d. 10. Tequin 200 mg q.d. 11. Indomethacin 25 mg t.i.d. 12. Sliding scale using Humalog before meals. 13. MS Contin 15 mg q.12h.  DISPOSITION:  Patient has been accepted in transfer to [facility] by Dr. Cathi Roan on the renal service.  Will include this dictation, admitting dictation, consultation dictation, and all laboratory and radiologic reports.  CONDITION ON DISCHARGE:  Stable. DD:  09/13/00 TD:  09/13/00 Job: [id number]

## 2010-10-23 NOTE — H&P (Signed)
NAMETAMLYN, PACITTI                            ACCOUNT NO.:  [id number]   MEDICAL RECORD NO.:  [id number]                   PATIENT TYPE:  INP   LOCATION:  5531                                 FACILITY:  [place]   PHYSICIAN:  Sean A. Loanne Drilling, M.D. [facility]           DATE OF BIRTH:  09-24-1960   DATE OF ADMISSION:  09/04/2002  DATE OF DISCHARGE:                                HISTORY & PHYSICAL   REASON FOR ADMISSION:  Abdominal pain.   HISTORY OF PRESENT ILLNESS:  The patient is a 50 year old woman with one  month history of generalized severe abdominal pain of a non-crampy quality.  There is associated intermittent diarrhea.   PAST MEDICAL HISTORY:  1. Morbid obesity.  2. Systemic lupus erythematosus.  3. Renal failure due to systemic lupus erythematosus.  4. Hypertension.  5. Type II diabetes mellitus.  6. Gout.  7. Cerebrovascular accident.  8. [name], no blood products.  9. Osteoporosis.  10.      Congestive heart failure.  11.      Proptosis with normal thyroid function studies.  12.      Gastroesophageal reflux disease.  13.      Dyslipidemia.  14.      Spinal osteoarthritis.  15.      Fibromyalgia.   PAST SURGICAL HISTORY:  1. Cholecystectomy in 1998.  2. Tubal ligation in 1983.   MEDICATIONS:  1. Prednisone 6 mg daily.  2. Pravachol 10 mg daily.  3. Protonix 40 mg daily.  4. Normodyne 300 mg twice a day.  5. Zoloft 100 mg twice a day.  6. Norvasc 10 mg daily.  7. Nephro-Vit one daily.   SOCIAL HISTORY:  The patient is divorced. She is disabled.   FAMILY HISTORY:  No one else at home is ill.   REVIEW OF SYSTEMS:  Slight fever and headache for a month. Also slight sore  throat for a few days. The patient denies earache, shortness of breath,  cough, rectal bleeding, hematuria, dysuria, skin rash and loss of  consciousness.   PHYSICAL EXAMINATION:  VITAL SIGNS: Blood pressure 100/60, heart rate 60,  temperature 100.7, weight 147 pounds.  GENERAL: No  distress.  SKIN: Not diaphoretic. No rash.  HEENT: Head is atraumatic. Sclera nonicteric. Pharynx clear.  NECK: Supple. No goiter.  CHEST: Clear to auscultation. No respiratory distress.  CARDIAC: No jugular venous distention. Trace bilateral edema. Regular rate  and rhythm. No murmur.  ABDOMEN: Soft with minimal diffuse tenderness. No hepatosplenomegaly. No  mass.  RECTAL: The patient too uncomfortable to have this at this moment. Not done  at this time due to patient condition.  BREAST: GYN examination not done at this time due to patient condition.  EXTREMITIES: On the right ankle, there is a 5 mm diameter shallow ulcer. 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  NEURO: Alert and well oriented.  Moves all four. Gait is observed and is  normal with a cane. Cranial nerves appear to be intact.   DIAGNOSTIC IMPRESSION:  Chest x-ray, no acute disease.   LABORATORY DATA:  WBC 15,900. Hemoglobin 14.4, platelets 93,000. Liver  function studies normal except for albumin of 3.2, protein 5.6.   IMPRESSION:  1. Abdominal pain of uncertain etiology.  2. Leukocytosis which could be due to Prednisone, but at only 6 mg per day,     this would seem to be unlikely.  3. Other problems as noted above.   PLAN:  1. Admit to [facility].  2. Blood cultures.  3. Antibiotics.  4. Abdominal ultrasound.  5. I discussed CODE STATUS with the patient. She states that she wants FULL     CODE but would not want to be started nor maintained on artificial life     support systems if there was not a reasonable chance of a functional     recovery.  6. I discussed with the patient, the possible yet unlikely possibility of     serious underlying medical illness for her fever and abdominal pain and     she agrees to hospitalization.                                               Sean A. Loanne Drilling, M.D. [facility]    SAE/MEDQ  D:  09/04/2002  T:  09/05/2002  Job:  [id number]

## 2010-10-23 NOTE — H&P (Signed)
[facility]  Patient:    Connie Ruiz, Connie Ruiz                         MRN: [id number] Adm. Date:  [id number] Attending:  Rosezetta Schlatter                         History and Physical  CHIEF COMPLAINT:  Aphasia with right-sided weakness.  HISTORY OF PRESENT ILLNESS:  Ms. Connie Ruiz is a 50 year old divorced black female with a longstanding history of lupus erythematosus with complications including lupus nephritis, chronic renal failure, history of stroke in 1994, question of cerebritis in the past.  Patient also with insulin-dependent diabetes and severe gout.  Patient was recently hospitalized February 24-28, 2002,for pneumonia.  Patient has a nephrologist and rheumatologist at [facility] in [place].  She was scheduled for admission there on Wednesday, September 14, 2000, for transplant evaluation.  Patient reports that on Friday, September 09, 2000, she developed speech difficulty with an expressive aphasia.  Patient also developed increased right-sided weakness.  These symptoms have remained stable with some actual progression such that she now presents to [facility] Emergency Department for evaluation. Patient does report she is having some difficulty with swallow.  She complains of mild shortness of breath.  Patient is now admitted for neurology evaluation, physical therapy, and speech pathology evaluations, IV fluid support.  PAST MEDICAL HISTORY:  Well-documented in previous notes at arecent admission.  Family history and social history likewise documented.  MEDICATIONS:  Epogen 10,000 units weekly, Cozaar 50 mg daily, labetalol 200 mg daily, Norvasc 10 mg daily, Ecotrin 325 mg daily, Ticlid 250 mg b.i.d., Imuran 125 mg q.d., Humalog 5 units before breakfast and lunchand 7 units before supper, albuterol metered-dose inhaler p.r.n., prednisone 20 mg b.i.d., Catapres patch 0.3 mg twice weekly, colchicine 0.6 mg b.i.d., Benemid 500 mg b.i.d., Pepcid  20 mg q.d., Ultram 50 mg q.6h. p.r.n.  PHYSICAL EXAMINATION:  VITAL SIGNS:  Temperature was 97,blood pressure 164/102, pulse 108, respirations 24.  GENERAL:  Obese black female in no acute distress.  HEENT:  Normocephalic, atraumatic.  Patient has notable proptosis bilaterally. Conjunctivae and sclerae were clear.  NECK:  Supple with full and active range of motion, including flexion, extension, and rotation.  NODES:  No adenopathy is noted in the cervical or supraclavicular regions.  CHEST:  Clearto auscultation and percussion.  CARDIAC:  2+ radial pulses.  She hada regular rate and rhythm without murmurs to my exam.  BREASTS:Deferred (recent mammogram summer of 2001).  ABDOMEN:  Massively obese with large panniculus.  Bowel sounds were positive. There was no guarding or tenderness or rebound.  PELVIC, RECTAL:  Deferred.  DERMATOLOGIC:  Patient has plastic surgical scars at both elbows.  Patient has cutaneous white nodules consistent with tophi.  NEUROLOGIC:  The patient is awake, alert.  She is oriented to person, place, context.  Speech is halting with an expressive aphasia but occasional fluent sentences. Cognitive function is difficult to gauge with this patient being a reasonably good historian and answers questions well, follows directions well. Cranial nerves II-XII:  Patient had normal facial movement.  She had no deviation of the tongue.  Her extraocular muscles were intact.  Pupils were equal and reactive.  She had no visual field cuts.  She had a weak shoulder shrug on the right.  Motor strength:  Decreased grip strength in the right hand, decreased proximal right upper  extremity strength, decreased proximal and distal right lower extremity strength.  Cerebellar function was unremarkable with no tremor, but the patient was not tested for gait, station, or balance.  LABORATORY DATA:  CK normal at 42, troponin I negative at 0.4.  INR was 1.0. Sodium 140,  potassium 5.3, chloride 114, CO2 16, BUN 95, creatinine 6, glucose 105.  Hemoglobin 8.4, hematocrit 25.1, white count 16,600, with 96% segs, 3% lymphs, 1% mono.  Platelet count 422,000.  Head CT was read out as no acute change.  ASSESSMENT AND PLAN: 1. Neurologic.  Patient with neurologic changes September 09, 2000, with increasing    weakness and expressive aphasia and cognitive impairment.  Question if this    is a cerebrovascular accident versus lupuscerebritis.  Patient has been on    Ticlid and aspirin but not on Coumadin.  She is on steroids and Imuran.    Plan:  Will continue the patients present medications.  Will ask for    neurology consult later in the day.  She will need physical therapy and    speech pathology consult.  Question if she would benefit from MRI. 2. Rheumatology.  Patient with longstanding systemic lupus erythematosus with    multiple sequelae.  Plan:  Patient will be continued on steroids and    Imuran. 3. Diabetes.  Patients serum glucose is within normal limits.  Given the    patients p.o. intake is variable, will follow her with Humalog sliding    scale. 4. Hypertension.  Patients blood pressure is poorly controlled at this time.    She reports she has been taking all of her medications.  Plan:  Will    continue her home medications but increase her labetalol to attain better    control, going to 200 mg b.i.d. initially. 5. Chronic renal failure with achronic anemia.  The patients potassium is    at acceptable levels.  BUN seems slightly elevated from her previous visit,    which at time of discharge February 28 was 5.4.  Plan:  Will monitor    patients potassium and follow up her BUN and creatinine. 6. Infectious disease.  Patient is afebrile.  She does have a leukocytosis,    possibly secondary to steroids, but a left shift.  Will cover the patient    with Tequin 200 mg IV q.24h. DD:  09/12/00 TD:  09/12/00 Job: [id number]

## 2010-10-23 NOTE — H&P (Signed)
Connie Ruiz, Connie Ruiz                            ACCOUNT NO.:  [id number]   MEDICAL RECORD NO.:  [id number]                   PATIENT TYPE:  INP   LOCATION:  3017                                 FACILITY:  [place]   PHYSICIAN:  Marletta Lor, M.D. [facility]      DATE OF BIRTH:  01-27-1961   DATE OF ADMISSION:  01/18/2003  DATE OF DISCHARGE:                                HISTORY & PHYSICAL   CHIEF COMPLAINT:  Confusion.   HISTORY OF PRESENT ILLNESS:  The patient is a 50 year old black female with  multiple medical problems.  She apparently was referred to the emergency  department by her nephrology practice in [facility] due to mental status  changes.  On arrival, the patient did complain of some difficulty with her  memory and speech and although alert, was quite confused, with some  dysphasia.  In addition, she was noted in the emergency setting to have a  cellulitis and abscess involving her left anterior lower extremity.  She is  now admitted for further evaluation and treatment of her acute mental status  changes and also for evaluation and treatment of her abscess and cellulitis  involving her left anterior lower leg.   PAST MEDICAL HISTORY:  The patient has a long history of lupus and has  chronic renal failure, on chronic dialysis.  She was last hospitalized in  April of 2004 for diarrhea secondary to C. difficile colitis.  She has a  history also of chronic abdominal pain and fibromyalgia and has been on  chronic narcotic pain medications.  Multiple medical problems also include  gout, hypertension, type 2 diabetes.  She was also hospitalized in April of  2002 for left brain stroke.  This apparently did result in some residual  right-sided weakness as well as some speech difficulty.  Other problems  include a history of congestive heart failure, osteoporosis and  gastroesophageal reflux disease.  She has a history also of dyslipidemia.   PAST SURGICAL HISTORY:  Surgical  procedures have included a cholecystectomy  as well as a tubal ligation in the past.   SOCIAL HISTORY:  She is divorced, disabled; she lives alone; does have a 39-  year-old daughter who lives in the vicinity.   MEDICAL REGIMEN:  1. Norvasc 10 mg daily.  2. Labetalol 20 mg b.i.d.  3. Folic acid 1 mg daily.  4. Coumadin, unclear dose; she has prescriptions for 3, 4 and 5 mg tablets.  5. Flagyl 500 mg b.i.d., which was initiated on January 12, 2003.  6. Colchicine 0.6 mg p.r.n. gout.  7. Oxycodone 5 mg immediate release every six hours p.r.n. pain.  8. Prednisone -- unclear dose; the patient has prescriptions for both 10 and     20 mg.  9. Pravachol -- unclear dose; the patient has prescriptions for both 10 and     40 mg.  10.      Phenergan 25 mg  every six hours p.r.n.  11.      Renagel 800 mg t.i.d.   PHYSICAL EXAMINATION:  GENERAL:  Examination revealed a well-developed,  overweight black female who is alert but with significant dysphasia.  VITAL SIGNS:  Vital signs were stable, blood pressure was normal, she was  afebrile.  SKIN:  Skin was remarkable for scattered areas of hyperpigmentation.  She  has scarring from apparent skin grafting from tophaceous gout over the elbow  areas.  In addition, she had an area of fluctuance, approximately 4 cm in  diameter, involving the left anterior mid lower leg.  This was fluctuant,  warm to touch and slightly tender.  There is some surrounding erythema.  HEENT:  Exam revealed proptosis.  Conjunctivae are clear.  Extraocular  muscles were full.  ENT negative.  NECK:  No audible bruits.  CHEST:  Chest was clear.  CARDIOVASCULAR:  Exam revealed normal S1 and S2.  There was no tachycardia,  no rub or gallop appreciated.  She had a Hickman catheter present involving  the left anterior chest wall area.  ABDOMEN:  Abdomen obese, soft and nontender.  No organomegaly.  EXTREMITIES:  Extremities revealed no significant peripheral edema.  NEUROLOGIC:   Examination revealed the patient to be alert.  She had  impairment of short- and long-term memory, was unable to give much of a  clinical history.  She did know that she was in the emergency department at  [facility].  She knew the day of the week but not the month or the year.  She knew the season was summer.  She did have some right-sided weakness that  appeared to be chronic.   IMPRESSION:  1. Confusion, worsening aphasia, possible recurrent left brain stroke.  2. Abscess and cellulitis, left anterior lower leg.  3. Chronic renal failure.  4. Systemic lupus erythematosus.  5. Chronic pain syndrome.  6. Gout.  7. Hypertension.  8. Dyslipidemia.  9. History of type 2 diabetes.   DISPOSITION:  The patient will be admitted to the hospital.  She will be  placed on antibiotic therapy.  She will be observed clinically.  We will  consider for a brain MRI.  We will continue her Coumadin dosing and follow  daily PT/INRs.  The patient will need dialysis scheduled for January 19, 2003.                                                Marletta Lor, M.D. [facility]    PFK/MEDQ  D:  01/18/2003  T:  01/19/2003  Job:  [id number]

## 2010-10-23 NOTE — Op Note (Signed)
NAMEEMMER, LOWMASTER                            ACCOUNT NO.:  [id number]   MEDICAL RECORD NO.:  [id number]                   PATIENT TYPE:  OIB   LOCATION:  2899                                 FACILITY:  [facility]   PHYSICIAN:  Judeth Cornfield. Scot Dock, M.D.        DATE OF BIRTH:  [birth year]   DATE OF PROCEDURE:  02/03/2002  DATE OF DISCHARGE:  02/03/2002                                 OPERATIVE REPORT   PREOPERATIVE DIAGNOSIS:  Chronic renal failure.   POSTOPERATIVE DIAGNOSIS:  Chronic renal failure.   PROCEDURE:  Ultrasound of both internal jugular veins and placement of 32 cm  left internal jugular Diatek catheter.   SURGEON:  Judeth Cornfield. Scot Dock, M.D.   ANESTHESIA:  Local with sedation.   INDICATIONS:  This is a 50 year old woman who apparently had a catheter  placed at an outlying institution.  She had presented to the emergency room  last night with the catheter essentially completely out and bleeding from  the exit site.  The catheter was removed and pressure held for hemostasis.  She was brought back today for placement of a new catheter.  According to  the patient, they had had problems placing it on the right side; therefore,  we selected a left IJ approach.   DESCRIPTION OF PROCEDURE:  The patient was taken to the operating room,  sedated by anesthesia.  An ultrasound of both internal jugular veins, and  both appeared to be patent.  They were marked.  The neck and upper chest  were then prepped and draped in the usual sterile fashion.  After the skin  was infiltrated with 1% lidocaine, the left internal jugular vein was  cannulated and a guidewire introduced into the superior vena cava under  fluoroscopic control.  The tract over the wire was dilated and then the  dilator and peel-away sheath were passed over the wire and the wire and  dilator removed.  Next the catheter was passed over the wire and through the  peel-away sheath and positioned in the right atrium.   The peel-away sheath  was removed.  The catheter was brought back into the right atrium and then  the exit site selected.  The skin was anesthetized between the two areas.  The catheter was brought through the tunnel and then the distal ports were  attached.  Both ports withdrew easily, were then flushed with heparinized  saline and filled with concentrated heparin.  The catheter was secured at  its exit site with a 3-0 nylon suture.  The IJ cannulation site was closed  with a 4-0 subcuticular stitch.  A sterile dressing was applied.  The  patient tolerated the procedure well, was transferred to the recovery room  in satisfactory condition.  All needle and sponge counts were correct.  Judeth Cornfield. Scot Dock, M.D.    CSD/MEDQ  D:  02/03/2002  T:  02/06/2002  Job:  [id number]

## 2010-10-23 NOTE — H&P (Signed)
[facility]  Patient:    Connie Ruiz, Connie Ruiz                         MRN: [id number] Adm. Date:  [id number] Attending:  Nyoka Cowden                         History and Physical  CHIEF COMPLAINT:  Weakness and cough.  HISTORY OF PRESENT ILLNESS:  The patient is a 50 year old black female with a long history of systemic lupus erythematosus on chronic immunosuppressive therapy.  She was stable until 5-6 days prior to admission when she first noted cough, largely nonproductive, and some chest congestion and hoarseness. Three days ago her nephrologist prescribed a Z-pack, but she continued to worsen.  Today she presents to the emergency room via EMS due to progressive weakness, cough, shortness of breath, fever, and chills.  ER evaluation included a chest x-ray that revealed a right middle lobe infiltrate and the patient is now admitted for further evaluation and treatment of suspected right middle lobe pneumonia.  PAST MEDICAL HISTORY:  The patient has a long history of systemic lupus since age 40.  This has been complicated by chronic renal failure.  The patient is followed at [facility] for her chronic renal insufficiency and plans are to place AV fistula soon.  Other complications of lupus include cerebrovascular disease.  She states that she has had two minor and two major strokes, usually affecting speech and right-sided weakness and numbness.  She has a history of significant gout.  This has required skin grafting over both elbows to remove tophaceous deposits.  Other medical problems include a history of diabetes mellitus, hypertension.  Operations include a remote cholecystectomy approximately three years ago.  SOCIAL HISTORY:  The patient is divorced, lives in [place].  One daughter, age 43.  ALLERGIES:  CODEINE and CODEINE DERIVATIVES, as well as SULFA ANTIBIOTICS.  MEDICAL REGIMEN:  1. Prednisone 6 mg daily.  2. ColBenemid one  tablet b.i.d.  3. Cozaar 50 mg daily.  4. Labetalol 200 mg daily.  5. Norvasc 10 mg daily.  6. Catapres patch 0.3 weekly.  7. Z-pack 250 mg daily.  8. Aspirin 325 mg daily.  9. Ticlid 250 b.i.d. 10. Imuran 125 mg daily. 11. Humalog 5 units prior to breakfast and dinner, 7 units prior to lunch. 12. She also takes Voltaren p.r.n. arthritic pain.  PHYSICAL EXAMINATION:  VITAL SIGNS:  Temperature 101, pulse 120, respiratory rate 20, blood pressure 160/100.  GENERAL:  Revealed a well-developed, overweight black female who is acutely ill, cushingoid, but alert and responsive.  SKIN:  Revealed evidence of skin grafting over both elbows.  She had numerous subcutaneous nodules related to her tophaceous gout.  HEENT:  Revealed normal pupil responses, conjunctivae clear.  Tympanic membranes and canals normal.  Oropharynx is minimally injected.  NECK:  Revealed no neck vein distention or adenopathy.  CHEST:  Revealed generally diminished breath sounds on the right, but no abnormal breath sounds otherwise appreciated.  CARDIOVASCULAR:  Revealed a resting tachycardia without murmur or gallop.  ABDOMEN:  Obese, soft, and nontender, no organomegaly.  She did have some subcutaneous deposition in her left lower abdominal region.  GENITALIA:  External genitalia normal.  EXTREMITIES:  Revealed trace edema.  The left dorsalis pedis pulse was diminished compared to the right.  NEUROLOGIC:  Revealed no obvious weakness.  There is no drift of the outstretched arms.  IMPRESSION:  Acute febrile illness in an immuno compromised patient with multiple comorbidities, probable early right middle lobe pneumonia.  DISPOSITION:  The patient will be admitted to the hospital.  Chest x-ray repeat in the morning.  She will be placed on Zosyn 2.25 mg every eight hours. She will be treated with intensive pulmonary toilet.  Blood and urine cultures will be obtained.  The patient will be maintained on her  multiple preadmission medications. DD:  07/31/00 TD:  08/01/00 Job: [id number]

## 2010-10-23 NOTE — Consult Note (Signed)
[facility]  Patient:    Connie Ruiz                         MRN: [id number] Proc. Date: 09/12/00 Adm. Date:  [id number] Attending:  Rosezetta Schlatter CC:         [place] Norins, M.D. [facility]   Consultation Report  DATE OF BIRTH:  [birth year].  CHIEF COMPLAINT:  Difficulty speaking and right-sided weakness.  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  She had a left middle cerebral artery distribution stroke involving the opercular region and the central artery (middle cerebral artery distribution) with substantial recovery.  Risk factors for stroke include insulin-dependent diabetes mellitus, hypertension and her lupus.  Co-morbid conditions include diffuse gout that appears to be tophaceous.  Patient had onset of weakness and difficulty speaking beginning Friday, April 5th, and for reasons that are unclear, did not seek medical attention until family members discovered and became concerned about this on Sunday afternoon and evening.  She was brought to the [facility] Emergency Room by EMS at around 8 p.m. and was admitted and evaluated.  She was seen by Dr. Heinz Knuckles. Norins and was admitted to the hospital.  EMS history noted that the patient had been talking clearly to one of the people evaluating her on the scene and no obvious weakness or disability. Patient did not have any focal deficits.  En route to [facility], the patient began to show slurred speech and appeared to be exaggerating her weakness on the right side.  She was placed on oxygen and was brought to the emergency room for evaluation.  REVIEW OF SYSTEMS:  Review of systems is extensive and includes anemia, treated with Epogen, hiatal hernia with reflux.  The patients renal insufficiency is associated also with  cystic disease of her kidneys and transplant using her daughter as a donor is being considered (see social history).  Patient has arthritis from her gout.  She also has widespread skin conditions which are quite painful (also related to gout).  She has chronic back pain and is complaining of left neck pain today.  Review of systems is otherwise negative.  CURRENT MEDICATIONS:  1. Cozaar 50 mg per day.  2. Labetalol 200 mg twice a day.  3. Norvasc 10 mg twice a day.  4. Catapres 0.3 mg patch weekly.  5. Enteric-coated aspirin 325 mg per day.  6. Ticlid 250 mg twice a day.  7. She had been on Imuran but this has been stopped.  8. Prednisone 20 mg three times a day (the actual dose is unclear).  9. Colchicine 0.6 mg twice a day. 10. Benemid 500 mg twice a day. 11. Protonix 40 mg per day. 12. Tequin 200 mg every 24 hours.  ALLERGIES:  Allergies to medicines include CODEINE, CODEINE DERIVATIVES and SULFA ANTIBIOTICS.  PAST MEDICAL HISTORY:  See above.  PAST SURGICAL HISTORY:  She has had surgery on her elbows for her gout. Patient had cholecystectomy in 1998, cesarean section in 1982, left elbow skin graft in 1999, also some form of right elbow surgery previously, date is unknown.  SOCIAL HISTORY:  The patient is divorced.  She lives in [place].  She has a daughter, age 58.  They are having trouble getting along, which has created a great  stress for her.  In addition, on Wednesday of this week, she had an appointment at [facility] with her daughter to discuss possible transplantation.  Patient does not use alcohol or tobacco.  FAMILY HISTORY:  Mother died of an MI two years ago; she had hypertension, diabetes and lupus.  Maternal aunts have hypertension and diabetes.  Fathers side is unknown.  PHYSICAL EXAMINATION:  GENERAL:  On examination today, pleasant, moderately obese right-handed woman in no acute distress.  She is 50 years of age.  She has some pain with movement  of her extremities.  VITAL SIGNS:  Temperature 97.3, blood pressure 154/92, resting pulse 94, respirations 19.  Pulse oximetry unknown.  Glucose 105.  LUNGS:  Clear.  HEART:  No murmurs.  Pulse is normal.  ABDOMEN:  Abdomen soft.  Bowel sounds normal.  EXTREMITIES:  Multiple gouty lesions.  No true edema.  There is swelling of the left foot which is also related to her gout (dorsum of the foot).  She is in tight elastic stockings to the ankle.  NEUROLOGIC:  Awake, alert.  She occasionally speaks in sentences.  She has stuttering speech but her enunciation when she speaks is perfect.  She follows commands.  She names objects.  She had difficulty repeating phrases because of her speech difficulty.  Cranial nerves:  Round reactive pupils.  Visual fields full to double simultaneous stimuli.  Fundi were normal.  She has a physiologic anisocoria. She has the appearance of mild ptosis at rest, but also has proptosis of her eyes, ______  and responses are equal bilaterally.  Symmetric facial strength.  Midline tongue and uvula.  Air conduction greater than bone conduction.  Motor examination:  Patient shows normal strength on the left. In the right upper extremity, she has at least 4/5 strength and clumsy fine motor movements but she has no pronator drift in either arm.  In her legs, she has 3/5 strength barely at the hip.  There is a positive Hoovers sign in the right leg.  Knee flexors and extensors, foot dorsiflexors and plantarflexors are in the 4/5 range.  Sensation shows a hypesthesia on the right side that splits the midline; despite this, she has good stereoagnosis bilaterally.  She perceives a tuning fork greater in the left forehead than the right.  Cerebellar examination: The left is normal.  The right is clumsy.  Gait is antalgic.  She drags the right leg but was able to pick it up when she walked and also was able to pick it up to get it back into bed.  Deep tendon reflexes  were diminished to  absent.  She had bilateral flexor plantar responses.  IMPRESSION:  1. Right hemiparesis and altered speech, acute.  2. Remote left middle cerebral artery stroke, 1994.  3. Hypertension.  4. Insulin-dependent diabetes mellitus.  5. Diffuse gout.  6. Systemic lupus erythematosus with lupus nephritis and chronic renal     failure.  7. Multiple external stressors as noted above.  COMMENT:  I strongly suspect that this examination is embellished and it is unclear to me whether this is a hysterical conversion reaction or frank malingering.  There is an outside chance that there is an extension of her prior cerebrovascular accident.  RECOMMENDATIONS:  1. MRI of the brain without and with contrast.  2. MRA, intracranial.  3. Continue intravenous fluids.  Do not use heparin at this time.  4. Speech, occupational and physical therapy.  5. Hold off on other workup for  cerebrovascular accident until the MRI     returns.  6. We may need to involve psychiatry but I suspect that just being positive     and telling her that she is going to get better may go a long way towards     improving the situation.  7. I would not change her medications at this time.  I appreciate the opportunity to see her. DD:  09/12/00 TD:  09/12/00 Job: [id number]

## 2010-10-23 NOTE — Discharge Summary (Signed)
Connie Ruiz, Connie Ruiz                            ACCOUNT NO.:  [id number]   MEDICAL RECORD NO.:  [id number]                   PATIENT TYPE:  INP   LOCATION:  3017                                 FACILITY:  [facility]   PHYSICIAN:  Gwendolyn Grant, M.D. [facility]          DATE OF BIRTH:  [birth year]   DATE OF ADMISSION:  01/18/2003  DATE OF DISCHARGE:  01/22/2003                                 DISCHARGE SUMMARY   DISCHARGE DIAGNOSES:  1. Left pretibial hematoma.  2. Mental status changes.   BRIEF HISTORY:  Connie Ruiz is a 50 year old African-American female with end-  stage renal disease who receives hemodialysis on Monday, Wednesday, and  Friday.  She was referred to the emergency department by her nephrology  practice in [facility] due to mental status changes.  The patient describes  short-term memory loss and some speech deficits. She was alert in the  emergency room but did appear to be confused.  It appeared that she may have  also had some cellulitis or abscess to the pretibial area of her left lower  extremity.  The patient was admitted for further evaluation.   PAST MEDICAL HISTORY:  1. Lupus.  2. End-stage renal disease on chronic hemodialysis.  3. History of C. difficile colitis in April 2004.  4. Fibromyalgia.  5. Chronic pain.  6. Gout.  7. Hypertension.  8. Adult-onset diabetes mellitus, diet controlled.  9. History of left brain CVA in 2002 with some residual right-sided weakness     and speech difficulties.  10.      History of congestive heart failure.  11.      Osteoporosis.  12.      Gastroesophageal reflux disease.  13.      Dyslipidemia.  14.      Status post cholecystectomy.  15.      Status post tubal ligation.   ALLERGIES:  Multiple including CODEINE, SULFA, DARVOCET, VANCOMYCIN,  VICODIN, and AVANDIA.   HOSPITAL COURSE:  #1.  INFECTIOUS DISEASE:  The patient presented with a questionable  cellulitis or abscess of her left pretibial area.  This was evaluated  by  surgery who felt this was more likely a hematoma.  They did not see any  evidence of infection or any indication for I & D.  The patient received  four days of Ancef, and then this was discontinued.   #2.  NEUROLOGIC:  The patient presented with some mental status changes.  There was concern about recurrent CVA.  Head CT revealed old ischemic  infarcts in the left mid parietal and right cerebellum with a question of  extension of ischemic infarct in the left middle anterior parietal area.  There was small vessel disease as well but no hemorrhage.  Of note, the  patient was therapeutic on her Coumadin on admission.  The patient's memory  changes have been transient.  She was evaluated by the neurology  team during  this admission with no indication for continued rehabilitation.  She states  that she is at her baseline at this time.   #3.  CHRONIC RENAL FAILURE, END-STAGE RENAL DISEASE:  Presumably secondary  to lupus.  The patient was followed by nephrology during this  hospitalization.  She did receive hemodialysis on January 20, 2003.  She will  arrange for her followup at the [facility].   #4.  ANTICOAGULATION:  The patient is chronically anticoagulated.  The  patient was therapeutic throughout her admission; however, on January 21, 2003, her pro time was 17.7, and INR was 1.7.  She will need a repeat pro  time when she follows up at the [facility].   LABORATORY DATA:  At discharge, pro time 17.7 and INR 1.7.  Hemoglobin 12.3,  hematocrit 37.9.  On 01/19/2003, her BUN was 17, creatinine 8.  Phosphorus  was 5.3.  Hemoglobin A1C 5.7%. TSH 0.931.   DISCHARGE MEDICATIONS:  She will resume her home medications including.  1. Norvasc 10 mg daily.  2. Labetalol 20 mg b.i.d.  3. Folic acid 1 mg daily.  4. Prednisone 10 mg daily.  5. Pravachol 40 mg daily.  6. Renagel 800 mg t.i.d.  7. Coumadin 4 mg daily.   FOLLOW UP:  She has been instructed to follow up with  Dr. Loanne Drilling in two to  three weeks to follow up on her pretibial hematoma and to contact [facility] to resume her dialysis schedule.      Helayne Seminole, P.A. [facility]                  Gwendolyn Grant, M.D. [facility]    LC/MEDQ  D:  01/22/2003  T:  01/22/2003  Job:  [id number]   cc:   Selena Batten, M.D.  [place]. Loanne Drilling, M.D. [facility]   Judeth Horn III, M.D.  [address] [place]  [place] [address]  Fax: [phone]

## 2010-10-23 NOTE — Discharge Summary (Signed)
[name], [name]                            ACCOUNT NO.:  [id number]   MEDICAL RECORD NO.:  [id number]                   PATIENT TYPE:  INP   LOCATION:  5531                                 FACILITY:  [facility]   PHYSICIAN:  Bruce Lemmie Evens. Swords, M.D. [facility]           DATE OF BIRTH:  September 10, 1960   DATE OF ADMISSION:  09/04/2002  DATE OF DISCHARGE:  09/09/2002                                 DISCHARGE SUMMARY   DISCHARGE DIAGNOSES:  1. Diarrhea, resolved.  2. Clinical diagnosis of Clostridium difficile colitis as the cause of #1.  3. Hypokalemia to be  followed as an outpatient.  4. Abdominal pain, chronic.  5. Morbid obesity.  6. Systemic lupus erythematosus.  7. Renal failure.  8. Hypertension.  9. Type 2 diabetes mellitus.  10.      Gout.  11.      History of cerebrovascular accident.  12.      Osteoporosis.  13.      Congestive heart failure.  14.      Proptosis.  15.      Gastroesophageal reflux disease.  16.      Dyslipidemia.  17.      Spinal osteoarthritis.  18.      Fibromyalgia.   PAST SURGICAL HISTORY:  1. Cholecystectomy.  2. Tubal ligation.   DISCHARGE MEDICATIONS:  1. Flagyl 250 mg p.o.  q.i.d. x3 days.  2. Prednisone taper as follows:  20 mg daily x3 days, 15 mg daily x3 days,     10 mg daily x3 days then resume her usual dose at 6 mg p.o. every day.  3. Pravachol 10 mg p.o. every day.  4. Tylenol 40 mg p.o. every day.  5. Norvasc 10 mg p.o. every day.  6. Zoloft 100 mg p.o. b.i.d.  7. Labetalol 300 mg p.o. b.i.d.  8. Nephro-Vite 1 daily.   CONDITION ON DISCHARGE:  Improved, diarrhea resolved.   FOLLOW UP:  Followup with Dr. Loanne Drilling in 1 to 2 weeks. She is scheduled for  dialysis as usual tomorrow morning.   LABORATORY DATA:  Stool cultures were incubated for better growth. BMET on  September 07, 2002, sodium 138, potassium 2.9, chloride 105, CO2 22, glucose 140,  BUN 44, creatinine 8.4. No fecal white blood cells were identified. Sed rate  8. CBC on  September 07, 2002, white count 13.2, hemoglobin 14.1, platelet count  88,000. Anti-DNA antibodies negative. C. difficile stool toxin negative.  Blood cultures negative to date. TSH normal at 1.232.   PROCEDURES:  Abdominal ultrasound demonstrated mild biliary prominence,  status post cholecystectomy. The pancreas was obscured. Atrophied kidneys  noted with cortical thinning and multiple renal cysts.   CONSULTS:  1. Renal for hemodialysis.  2. Gastroenterology.  3. Surgery.   HOSPITAL COURSE:  The patient was admitted to the hospital service on September 04, 2002, by Dr. Renato Shin. See his admission  note for details.   PROBLEM #1, GASTROINTESTINAL:  The patient was admitted for persistent  diarrhea. The patient was seen in consultation by gastroenterology. They  suggested empiric treatment of C. difficile colitis. This was most likely  clinically. She was treated with Flagyl and Questran. Her symptoms improved  dramatically, and at the time of discharge she had not had a bowel movement  in 24 hours. She continues to have abdominal pain, but when she is asked she  admits to chronic abdominal pain for the past 2 years and her current pain  is not any different than usual.   PROBLEM #2, END-STAGE RENAL DISEASE:  The patient was seen  by nephrology  and had appropriate hemodialysis in the hospital. Her potassium was somewhat  low on September 07, 2002. This was not replaced.  This will be done as an  outpatient with hemodialysis.   PROBLEM #3, NUMEROUS OTHER MEDICAL PROBLEMS:  Stable.                                               Bruce Lemmie Evens Swords, M.D. [facility]    BHS/MEDQ  D:  09/09/2002  T:  09/09/2002  Job:  [id number]   cc:   Hilliard Clark A. Loanne Drilling, M.D. [facility]

## 2010-10-23 NOTE — Discharge Summary (Signed)
[facility]  Patient:    Connie Ruiz, Connie Ruiz                         MRN: [id number] Adm. Date:  [id number] Disc. Date: [id number] Attending:  Nyoka Cowden Dictator:   Biagio Borg, M.D. [facility] CC:         Marletta Lor, M.D. [facility] Olga Coaster, M.D. [facility], nephrology   Discharge Summary  DISCHARGE DIAGNOSES: 1. Right middle lobe pneumonia. 2. Systemic lupus erythematosus. 3. 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 4. History of renal insufficiency/failure due for a vascular access    predialysis. 5. Diabetes mellitus. 6. Hypertension. 7. Morbid obesity.  CONSULTATIONS:  None.  PROCEDURES:  None.  HISTORY AND PHYSICAL:  Please see that dictated by Dr. Burnice Logan July 31, 2000.  HISTORY OF PRESENT ILLNESS:  Connie Ruiz is a pleasant 50 year old black female, unfortunately with multiple medical problems as above, also status post cholecystectomy who is also followed by nephrology at [facility]:  She was admitted after failing azithromycin Z-PAK as an outpatient with large nonproductive cough, chest congestion, hoarseness. Symptoms worsened.  She was seen in the emergency room locally with a chest x-ray revealing right middle lobe infiltrate with nausea, vomiting, tachycardia, tachypnea, febrile to 101, although not hypoxic.  She was treated initially with IV antibiotics including Zosyn to which she responded nicely over the next several days in the hospital without incident.  Her cough, shortness of breath, chest discomfort, appetite, fatigue generally lessened. She became afebrile and by time of discharge she was ambulatory and receiving some of her meals and taking p.o. otherwise well and was felt that she had gained maximal benefit from this hospitalization.  She was also added Phenergan and Pepcid for gastrointestinal upset related as well as advair  with pulmonary symptoms which seemed to help as well to her chronic medications. She did have some general aches and pains including some exacerbation of chronic low back pain, largely felt due to being in the bed.  This was treated successfully with Ultram to which she is given a prescription in the way home to.  She normally gets Epogen 10,000 weekly on Fridays and this would be administered prior to discharge as well today.  Her CBGs remained fairly stable throughout and in fact in the 24 hours prior to discharge on oral Augmentin were 140, 141, 103 and 135 on her usual outpatient regimen of Humalog 5, 5 and 7.  Blood pressures were noted slightly elevated throughout, somewhat less at the time of discharge, around 142-154/90 on home medications. As there were no other problems it was felt that she had gained maximal benefit since hospitalization.  It should be noted that her baseline creatinine apparently according to her is in the upper 4s and was 5.4 day prior to discharge with BUN 56.  Blood cultures should be noted for no growth today at time of discharge.  DISPOSITION:  The patient is to be discharged to home.  She lives alone.  Her home health will be notified at time of discharge.  She will be given her Epogen 10,000K today just prior to discharge a day early and then she will need continued follow up on a weekly basis on Fridays.  She will follow up in one to two weeks with Dr. Loanne Drilling with a cbc and Bmet.  She will also follow up with vascular service and  renal at [facility] per patient as planned already.  DISCHARGE MEDICATIONS:  1. Epogen 10,000 weekly on Fridays, usually administered at [facility] in [place].  2. Cozaar 50 mg p.o. q.d.  3. Labetalol 200 mg p.o. q.d.  4. Norvasc 10 mg p.o. q.d.  5. Ecotrin 325 mg p.o. q.d.  6. Ticlid 250 mg b.i.d.  7. Imuran 125 mg q.d.  8. Humalog 5, 5, and 7.  9. Albuterol meter dose inhaler p.r.n. 10. Advair 250/50 mg  b.i.d. for temporary dosing given her current pulmonary     exacerbation. 11. Prednisone 10 mg b.i.d. which was the usual dose prior. 12. Catapres 0.3 mg patch weekly. 13. Colchicine 0.6 mg b.i.d. 14. Benemid 500 mg b.i.d. 15. Pepcid 20 mg p.o. q.d. which is suspect will be a short-term prescription. 16. Phenergan on a p.r.n. basis. 17. Ultram 50 mg on a p.r.n. basis. 18. Augmentin 875 mg b.i.d. for five days. DD:  08/04/00 TD:  08/04/00 Job: [id number]

## 2010-10-26 ENCOUNTER — Ambulatory Visit (INDEPENDENT_AMBULATORY_CARE_PROVIDER_SITE_OTHER): Payer: PRIVATE HEALTH INSURANCE | Admitting: *Deleted

## 2010-10-26 DIAGNOSIS — I2699 Other pulmonary embolism without acute cor pulmonale: Secondary | ICD-10-CM

## 2010-10-26 DIAGNOSIS — I6350 Cerebral infarction due to unspecified occlusion or stenosis of unspecified cerebral artery: Secondary | ICD-10-CM

## 2010-10-26 LAB — POCT INR: INR: 1.2

## 2010-11-06 ENCOUNTER — Ambulatory Visit (INDEPENDENT_AMBULATORY_CARE_PROVIDER_SITE_OTHER): Payer: PRIVATE HEALTH INSURANCE | Admitting: *Deleted

## 2010-11-06 DIAGNOSIS — I6350 Cerebral infarction due to unspecified occlusion or stenosis of unspecified cerebral artery: Secondary | ICD-10-CM

## 2010-11-06 DIAGNOSIS — I2699 Other pulmonary embolism without acute cor pulmonale: Secondary | ICD-10-CM

## 2010-11-06 LAB — POCT INR: INR: 2.3

## 2010-11-27 ENCOUNTER — Ambulatory Visit (INDEPENDENT_AMBULATORY_CARE_PROVIDER_SITE_OTHER): Payer: PRIVATE HEALTH INSURANCE | Admitting: *Deleted

## 2010-11-27 DIAGNOSIS — I6350 Cerebral infarction due to unspecified occlusion or stenosis of unspecified cerebral artery: Secondary | ICD-10-CM

## 2010-11-27 DIAGNOSIS — I2699 Other pulmonary embolism without acute cor pulmonale: Secondary | ICD-10-CM

## 2010-11-27 LAB — POCT INR: INR: 3.1

## 2010-12-25 ENCOUNTER — Ambulatory Visit (INDEPENDENT_AMBULATORY_CARE_PROVIDER_SITE_OTHER): Payer: PRIVATE HEALTH INSURANCE | Admitting: *Deleted

## 2010-12-25 DIAGNOSIS — I6350 Cerebral infarction due to unspecified occlusion or stenosis of unspecified cerebral artery: Secondary | ICD-10-CM

## 2010-12-25 DIAGNOSIS — I2699 Other pulmonary embolism without acute cor pulmonale: Secondary | ICD-10-CM

## 2010-12-25 LAB — POCT INR: INR: 2.7

## 2010-12-29 ENCOUNTER — Other Ambulatory Visit: Payer: Self-pay | Admitting: Cardiology

## 2011-01-08 ENCOUNTER — Encounter: Payer: Self-pay | Admitting: Endocrinology

## 2011-01-08 ENCOUNTER — Ambulatory Visit (INDEPENDENT_AMBULATORY_CARE_PROVIDER_SITE_OTHER): Payer: PRIVATE HEALTH INSURANCE | Admitting: Endocrinology

## 2011-01-08 ENCOUNTER — Other Ambulatory Visit (INDEPENDENT_AMBULATORY_CARE_PROVIDER_SITE_OTHER): Payer: PRIVATE HEALTH INSURANCE

## 2011-01-08 DIAGNOSIS — Z7901 Long term (current) use of anticoagulants: Secondary | ICD-10-CM | POA: Insufficient documentation

## 2011-01-08 DIAGNOSIS — M109 Gout, unspecified: Secondary | ICD-10-CM

## 2011-01-08 DIAGNOSIS — N259 Disorder resulting from impaired renal tubular function, unspecified: Secondary | ICD-10-CM

## 2011-01-08 DIAGNOSIS — E785 Hyperlipidemia, unspecified: Secondary | ICD-10-CM

## 2011-01-08 DIAGNOSIS — E119 Type 2 diabetes mellitus without complications: Secondary | ICD-10-CM

## 2011-01-08 LAB — LIPID PANEL
Cholesterol: 160 mg/dL (ref 0–200)
HDL: 68.5 mg/dL (ref >39.00)
LDL Cholesterol: 73 mg/dL (ref 0–99)
Total CHOL/HDL Ratio: 2
Triglycerides: 92 mg/dL (ref 0.0–149.0)
VLDL: 18.4 mg/dL (ref 0.0–40.0)

## 2011-01-08 LAB — CBC WITH DIFFERENTIAL/PLATELET
Basophils Absolute: 0 10*3/uL (ref 0.0–0.1)
Basophils Relative: 0.3 % (ref 0.0–3.0)
Eosinophils Absolute: 0 10*3/uL (ref 0.0–0.7)
Eosinophils Relative: 0.3 % (ref 0.0–5.0)
HCT: 36.6 % (ref 36.0–46.0)
Hemoglobin: 12.3 g/dL (ref 12.0–15.0)
Lymphocytes Relative: 36.1 % (ref 12.0–46.0)
Lymphs Abs: 2.1 10*3/uL (ref 0.7–4.0)
MCHC: 33.6 g/dL (ref 30.0–36.0)
MCV: 90.1 fl (ref 78.0–100.0)
Monocytes Absolute: 0.3 10*3/uL (ref 0.1–1.0)
Monocytes Relative: 5.5 % (ref 3.0–12.0)
Neutro Abs: 3.3 10*3/uL (ref 1.4–7.7)
Neutrophils Relative %: 57.8 % (ref 43.0–77.0)
Platelets: 167 10*3/uL (ref 150.0–400.0)
RBC: 4.07 Mil/uL (ref 3.87–5.11)
RDW: 14.1 % (ref 11.5–14.6)
WBC: 5.7 10*3/uL (ref 4.5–10.5)

## 2011-01-08 LAB — TSH: TSH: 0.96 u[IU]/mL (ref 0.35–5.50)

## 2011-01-08 LAB — URIC ACID: Uric Acid, Serum: 3 mg/dL (ref 2.4–7.0)

## 2011-01-08 NOTE — Progress Notes (Signed)
Subjective:    Patient ID: Connie Ruiz, female    DOB: Sep 14, 1960, 50 y.o.   MRN: [id number]  HPI Pt says she is "20%" improved since her cva, which was in oct, 2011.  She has 10 mos of moderate weakness of the right arm, leg, and face.  She has assoc numbness.  Past Medical History  Diagnosis Date  . THYROID NODULE, LEFT 04/10/2009  . DIABETES MELLITUS, TYPE II 08/21/2007  . HYPERLIPIDEMIA 08/21/2007  . GOUT 08/21/2007  . HYPERTENSION 08/21/2007    Dr. Andree Elk, [place] 08/21/2007  . CVA 04/17/2010  . CEREBROVASCULAR ACCIDENT, ACUTE 04/15/2010  . GERD 08/21/2007  . RENAL INSUFFICIENCY 08/21/2007  . LUPUS 08/21/2007  . OSTEOPOROSIS 08/21/2007    Rheumatol at [facility]  . DVT, HX OF 08/21/2007  . CLOSTRIDIUM DIFFICILE COLITIS, HX OF 08/21/2007  . KIDNEY TRANSPLANTATION, HX OF 08/22/2007    s/p renal transplant-Dr. Andree Elk, [facility]  . Pulmonary embolism 07/16/2010  . Renal failure   . Current use of long term anticoagulation     Dr. Andree Elk, [facility]  . Depression     Dr. Andree Elk, [facility]    Past Surgical History  Procedure Date  . Cholecystectomy   . Tubal ligation     History   Social History  . Marital Status: Divorced    Spouse Name: N/A    Number of Children: N/A  . Years of Education: N/A   Occupational History  . DISABILITY    Social History Main Topics  . Smoking status: Never Smoker   . Smokeless tobacco: Not on file  . Alcohol Use: No  . Drug Use: No  . Sexually Active:    Other Topics Concern  . Not on file   Social History Narrative   RetiredRegular exercise-no    Current Outpatient Prescriptions on File Prior to Visit  Medication Sig Dispense Refill  . diltiazem (DILACOR XR) 180 MG 24 hr capsule Take 180 mg by mouth daily.        Marland Kitchen esomeprazole (NEXIUM) 40 MG capsule Take 40 mg by mouth daily.        . folic acid (FOLVITE) 1 MG tablet Take 1 mg by mouth daily.        Marland Kitchen glimepiride (AMARYL) 2 MG tablet Take 2 mg by mouth every morning.         Marland Kitchen glucose blood (PRODIGY NO CODING BLOOD GLUC) test strip Use as instructed once daily dx 250.00      . guaiFENesin (MUCINEX) 600 MG 12 hr tablet Take 1,200 mg by mouth 2 (two) times daily. For cough symptoms       . mycophenolate (CELLCEPT) 250 MG capsule Take 250 mg by mouth daily.        . predniSONE (DELTASONE) 1 MG tablet Take 3 tablets by mouth once daily       . sertraline (ZOLOFT) 100 MG tablet Take 100 mg by mouth daily.        . tacrolimus (PROGRAF) 1 MG capsule Take 1 mg by mouth 2 (two) times daily.        Marland Kitchen warfarin (COUMADIN) 1 MG tablet USE AS DIRECTED BY ANTICOAGUALTION CLINIC  45 tablet  2  . warfarin (COUMADIN) 4 MG tablet TAKE AS DIRECTED  45 tablet  2  . ZINC SULFATE PO Take 1 tablet by mouth daily.          Allergies  Allergen Reactions  . Codeine     REACTION:  nausea  . Oxycodone-Acetaminophen   . Propoxyphene N-Acetaminophen   . Sulfonamide Derivatives    Family History  Problem Relation Age of Onset  . Cancer Neg Hx    BP 134/86  Pulse 90  Temp(Src) 99.2 F (37.3 C) (Oral)  Ht 5\' 4"  (1.626 m)  Wt 177 lb (80.287 kg)  BMI 30.38 kg/m2  SpO2 98%  Review of Systems denies hypoglycemia.  She has mild dysphagia, since the cva.      Objective:   Physical Exam GENERAL: no distress Gait: she heavily favors the right leg.   Neuro:  Severe weakness of the right arm, leg and face.      Assessment & Plan:  Cva, stable Dm.  She is at risk for hypoglycemia, in view of sulfonylurea use. Htn, well-controlled

## 2011-01-08 NOTE — Patient Instructions (Addendum)
Please schedule a regular physical.   blood tests are being ordered for you today.  please call [phone] to hear your test results.  You will be prompted to enter the 9-digit "MRN" number that appears at the top left of this page, followed by #.  Then you will hear the message.

## 2011-01-11 ENCOUNTER — Telehealth: Payer: Self-pay | Admitting: Endocrinology

## 2011-01-11 LAB — PTH, INTACT AND CALCIUM
Calcium, Total (PTH): 8.7 mg/dL (ref 8.4–10.5)
PTH: 111.6 pg/mL — ABNORMAL HIGH (ref 14.0–72.0)

## 2011-01-11 LAB — HEMOGLOBIN A1C: Hgb A1c MFr Bld: 6 % (ref 4.6–6.5)

## 2011-01-11 MED ORDER — CALCITRIOL 0.25 MCG PO CAPS: 0.2500 ug | ORAL_CAPSULE | Freq: Every day | ORAL | Status: DC

## 2011-01-11 MED ORDER — GLIMEPIRIDE 1 MG PO TABS: 1.0000 mg | ORAL_TABLET | Freq: Every day | ORAL | Status: DC

## 2011-01-11 NOTE — Telephone Encounter (Signed)
i left message on phone tree Reduce amaryl to 1 mg qam.  Start rocaltrol 0.25/d

## 2011-01-22 ENCOUNTER — Encounter: Payer: PRIVATE HEALTH INSURANCE | Admitting: *Deleted

## 2011-01-29 ENCOUNTER — Encounter: Payer: PRIVATE HEALTH INSURANCE | Admitting: *Deleted

## 2011-02-01 ENCOUNTER — Ambulatory Visit (INDEPENDENT_AMBULATORY_CARE_PROVIDER_SITE_OTHER): Payer: PRIVATE HEALTH INSURANCE | Admitting: *Deleted

## 2011-02-01 DIAGNOSIS — I2699 Other pulmonary embolism without acute cor pulmonale: Secondary | ICD-10-CM

## 2011-02-01 DIAGNOSIS — I6350 Cerebral infarction due to unspecified occlusion or stenosis of unspecified cerebral artery: Secondary | ICD-10-CM

## 2011-02-01 LAB — POCT INR: INR: 2.1

## 2011-02-05 ENCOUNTER — Encounter: Payer: PRIVATE HEALTH INSURANCE | Admitting: Endocrinology

## 2011-02-12 ENCOUNTER — Encounter: Payer: PRIVATE HEALTH INSURANCE | Admitting: Endocrinology

## 2011-03-01 ENCOUNTER — Encounter: Payer: PRIVATE HEALTH INSURANCE | Admitting: *Deleted

## 2011-03-01 LAB — COMPREHENSIVE METABOLIC PANEL
ALT: 21
AST: 27
Albumin: 3.1 — ABNORMAL LOW
Alkaline Phosphatase: 65
BUN: 10
CO2: 23
Calcium: 8.9
Chloride: 109
Creatinine, Ser: 0.7
GFR calc Af Amer: >60
GFR calc non Af Amer: >60
Glucose, Bld: 92
Potassium: 3.6
Sodium: 138
Total Bilirubin: 1.1
Total Protein: 6.3

## 2011-03-01 LAB — BASIC METABOLIC PANEL
BUN: 6
BUN: 8
CO2: 23
CO2: 23
Calcium: 8.5
Calcium: 9
Chloride: 108
Chloride: 108
Creatinine, Ser: 0.56
Creatinine, Ser: 0.73
GFR calc Af Amer: >60
GFR calc Af Amer: >60
GFR calc non Af Amer: >60
GFR calc non Af Amer: >60
Glucose, Bld: 73
Glucose, Bld: 93
Potassium: 3.2 — ABNORMAL LOW
Potassium: 4
Sodium: 137
Sodium: 139

## 2011-03-01 LAB — URINE CULTURE: Colony Count: 10000

## 2011-03-01 LAB — DIFFERENTIAL
Basophils Absolute: 0
Basophils Relative: 0
Eosinophils Absolute: 0
Eosinophils Relative: 0
Lymphocytes Relative: 21
Lymphs Abs: 3.2
Monocytes Absolute: 1
Monocytes Relative: 7
Neutro Abs: 10.8 — ABNORMAL HIGH
Neutrophils Relative %: 72

## 2011-03-01 LAB — URINE MICROSCOPIC-ADD ON

## 2011-03-01 LAB — CULTURE, BLOOD (ROUTINE X 2)
Culture: NO GROWTH
Culture: NO GROWTH

## 2011-03-01 LAB — PROTIME-INR
INR: 2.1 — ABNORMAL HIGH
INR: 2.1 — ABNORMAL HIGH
INR: 2.2 — ABNORMAL HIGH
INR: 2.6 — ABNORMAL HIGH
Prothrombin Time: 23.9 — ABNORMAL HIGH
Prothrombin Time: 24.1 — ABNORMAL HIGH
Prothrombin Time: 25 — ABNORMAL HIGH
Prothrombin Time: 28.7 — ABNORMAL HIGH

## 2011-03-01 LAB — CBC
HCT: 33.8 — ABNORMAL LOW
HCT: 36.8
HCT: 39.8
Hemoglobin: 11.8 — ABNORMAL LOW
Hemoglobin: 12.8
Hemoglobin: 13.7
MCHC: 34.5
MCHC: 34.8
MCHC: 34.9
MCV: 89.5
MCV: 90
MCV: 90.2
Platelets: 123 — ABNORMAL LOW
Platelets: 131 — ABNORMAL LOW
Platelets: 145 — ABNORMAL LOW
RBC: 3.75 — ABNORMAL LOW
RBC: 4.11
RBC: 4.42
RDW: 13.8
RDW: 14
RDW: 14.2
WBC: 11.1 — ABNORMAL HIGH
WBC: 15 — ABNORMAL HIGH
WBC: 7

## 2011-03-01 LAB — CULTURE, RESPIRATORY W GRAM STAIN: Culture: NORMAL

## 2011-03-01 LAB — HEPATIC FUNCTION PANEL
ALT: 19
AST: 27
Albumin: 3.2 — ABNORMAL LOW
Alkaline Phosphatase: 63
Bilirubin, Direct: 0.2
Indirect Bilirubin: 0.7
Total Bilirubin: 0.9
Total Protein: 6

## 2011-03-01 LAB — URINALYSIS, ROUTINE W REFLEX MICROSCOPIC
Glucose, UA: NEGATIVE
Hgb urine dipstick: NEGATIVE
Leukocytes, UA: NEGATIVE
Nitrite: NEGATIVE
Protein, ur: 30 — AB
Specific Gravity, Urine: 1.038 — ABNORMAL HIGH
Urobilinogen, UA: 1
pH: 8

## 2011-03-01 LAB — INFLUENZA A+B VIRUS AG-DIRECT(RAPID)
Inflenza A Ag: NEGATIVE
Influenza B Ag: NEGATIVE

## 2011-03-01 LAB — CK: Total CK: 55

## 2011-03-01 LAB — APTT: aPTT: 75 — ABNORMAL HIGH

## 2011-03-01 LAB — PREGNANCY, URINE: Preg Test, Ur: NEGATIVE

## 2011-03-02 ENCOUNTER — Ambulatory Visit (INDEPENDENT_AMBULATORY_CARE_PROVIDER_SITE_OTHER): Payer: PRIVATE HEALTH INSURANCE | Admitting: *Deleted

## 2011-03-02 DIAGNOSIS — I2699 Other pulmonary embolism without acute cor pulmonale: Secondary | ICD-10-CM

## 2011-03-02 DIAGNOSIS — I6350 Cerebral infarction due to unspecified occlusion or stenosis of unspecified cerebral artery: Secondary | ICD-10-CM

## 2011-03-02 LAB — POCT INR: INR: 2.1

## 2011-03-04 ENCOUNTER — Inpatient Hospital Stay (INDEPENDENT_AMBULATORY_CARE_PROVIDER_SITE_OTHER)
Admission: RE | Admit: 2011-03-04 | Discharge: 2011-03-04 | Disposition: A | Discharge status: Home / Self Care | Payer: PRIVATE HEALTH INSURANCE | Source: Ambulatory Visit | Attending: Emergency Medicine | Admitting: Emergency Medicine

## 2011-03-04 DIAGNOSIS — J029 Acute pharyngitis, unspecified: Secondary | ICD-10-CM

## 2011-03-04 LAB — POCT RAPID STREP A: Streptococcus, Group A Screen (Direct): NEGATIVE

## 2011-03-30 ENCOUNTER — Ambulatory Visit (INDEPENDENT_AMBULATORY_CARE_PROVIDER_SITE_OTHER): Payer: PRIVATE HEALTH INSURANCE | Admitting: *Deleted

## 2011-03-30 DIAGNOSIS — I6350 Cerebral infarction due to unspecified occlusion or stenosis of unspecified cerebral artery: Secondary | ICD-10-CM

## 2011-03-30 DIAGNOSIS — I2699 Other pulmonary embolism without acute cor pulmonale: Secondary | ICD-10-CM

## 2011-03-30 LAB — POCT INR: INR: 1.6

## 2011-04-13 ENCOUNTER — Encounter: Payer: Self-pay | Admitting: Endocrinology

## 2011-04-13 ENCOUNTER — Ambulatory Visit (INDEPENDENT_AMBULATORY_CARE_PROVIDER_SITE_OTHER): Payer: PRIVATE HEALTH INSURANCE | Admitting: Endocrinology

## 2011-04-13 VITALS — BP 102/78 | HR 94 | Temp 98.0°F | Ht 64.0 in | Wt 182.6 lb

## 2011-04-13 DIAGNOSIS — I10 Essential (primary) hypertension: Secondary | ICD-10-CM

## 2011-04-13 DIAGNOSIS — Z0000 Encounter for general adult medical examination without abnormal findings: Secondary | ICD-10-CM | POA: Insufficient documentation

## 2011-04-13 NOTE — Patient Instructions (Addendum)
please consider these measures for your health:  minimize alcohol.  do not use tobacco products.  have a colonoscopy at least every 10 years from age 49.  Women should have an annual mammogram from age 49.  keep firearms safely stored.  always use seat belts.  have working smoke alarms in your home.  see an eye doctor and dentist regularly.  never drive under the influence of alcohol or drugs (including prescription drugs).   please let me know what your wishes would be, if artificial life support measures should become necessary.  it is critically important to prevent falling down (keep floor areas well-lit, dry, and free of loose objects.  If you have a cane, walker, or wheelchair, you should use it, even for short trips around the house.  Also, try not to rush). Please come back for a follow-up appointment in 4 months. Refer to a specialist, to set-up a colonoscopy.  you will receive a phone call, about a day and time for an appointment.

## 2011-04-13 NOTE — Progress Notes (Signed)
Subjective:    Patient ID: Connie Ruiz, female    DOB: 08-27-1960, 50 y.o.   MRN: [id number]  HPI here for regular wellness examination.  He's feeling pretty well in general, and says chronic med probs are stable, except as noted below.  Gyn is dr Gweneth Dimitri dept, [place] Past Medical History  Diagnosis Date  . THYROID NODULE, LEFT 04/10/2009  . DIABETES MELLITUS, TYPE II 08/21/2007  . HYPERLIPIDEMIA 08/21/2007  . GOUT 08/21/2007  . HYPERTENSION 08/21/2007    Dr. Andree Elk, [place] 08/21/2007  . CVA 04/17/2010  . CEREBROVASCULAR ACCIDENT, ACUTE 04/15/2010  . GERD 08/21/2007  . RENAL INSUFFICIENCY 08/21/2007  . LUPUS 08/21/2007  . OSTEOPOROSIS 08/21/2007    R[facility]umatol at [facility]  . DVT, HX OF 08/21/2007  . CLOSTRIDIUM DIFFICILE COLITIS, HX OF 08/21/2007  . KIDNEY TRANSPLANTATION, HX OF 08/22/2007    s/p renal transplant-[facility]. Andree Elk, [facility]  . Pulmonary embolism 07/16/2010  . Renal failure   . Current use of long term anticoagulation     Dr. Andree Elk, [facility]  . Depression     Dr. Andree Elk, [facility]    Past Surgical History  Procedure Date  . Cholecystectomy   . Tubal ligation     History   Social History  . Marital Status: Divorced    Spouse Name: N/A    Number of Children: N/A  . Years of Education: N/A   Occupational History  . DISABILITY    Social History Main Topics  . Smoking status: Never Smoker   . Smokeless tobacco: Not on file  . Alcohol Use: No  . Drug Use: No  . Sexually Active:    Other Topics Concern  . Not on file   Social History Narrative   RetiredRegular exercise-no    Current Outpatient Prescriptions on File Prior to Visit  Medication Sig Dispense Refill  . calcitRIOL (ROCALTROL) 0.25 MCG capsule Take 1 capsule (0.25 mcg total) by mouth daily.  30 capsule  11  . diltiazem (DILACOR XR) 180 MG 24 hr capsule Take 180 mg by mouth daily.        Marland Kitchen esomeprazole (NEXIUM) 40 MG capsule Take 40 mg by mouth daily.        . folic acid  (FOLVITE) 1 MG tablet Take 1 mg by mouth daily.        Marland Kitchen glimepiride (AMARYL) 1 MG tablet Take 1 tablet (1 mg total) by mouth daily before breakfast.  30 tablet  11  . glucose blood (PRODIGY NO CODING BLOOD GLUC) test strip Use as instructed once daily dx 250.00      . guaiFENesin (MUCINEX) 600 MG 12 hr tablet Take 1,200 mg by mouth 2 (two) times daily. For cough symptoms       . mycophenolate (CELLCEPT) 250 MG capsule Take 250 mg by mouth daily.        . predniSONE (DELTASONE) 1 MG tablet Take 3 tablets by mouth once daily       . sertraline (ZOLOFT) 100 MG tablet Take 100 mg by mouth daily.        . tacrolimus (PROGRAF) 1 MG capsule Take 1 mg by mouth 2 (two) times daily.        Marland Kitchen warfarin (COUMADIN) 1 MG tablet USE AS DIRECTED BY ANTICOAGUALTION CLINIC  45 tablet  2  . warfarin (COUMADIN) 4 MG tablet TAKE AS DIRECTED  45 tablet  2  . ZINC SULFATE PO Take 1 tablet by mouth daily.  Allergies  Allergen Reactions  . Codeine     REACTION: nausea  . Oxycodone-Acetaminophen   . Propoxyphene N-Acetaminophen   . Sulfonamide Derivatives     Family History  Problem Relation Age of Onset  . Cancer Neg Hx     BP 102/78  Pulse 94  Temp(Src) 98 F (36.7 C) (Oral)  Ht 5\' 4"  (1.626 m)  Wt 182 lb 9.6 oz (82.827 kg)  BMI 31.34 kg/m2  SpO2 97%    Review of Systems  Constitutional: Negative for fever.       She reports weight gain  HENT: Negative for hearing loss.   Eyes:       She attributes visual loss to cva  Cardiovascular:       She seldom has chest pain  Gastrointestinal: Negative for anal bleeding.  Genitourinary: Negative for hematuria.  Musculoskeletal: Positive for gait problem.  Skin: Negative for rash.  Neurological: Positive for headaches. Negative for syncope and numbness.  Hematological: Bruises/bleeds easily.  Psychiatric/Behavioral: Positive for dysphoric mood.       Objective:   Physical Exam VS: see vs page GEN: no distress HEAD: head: no  deformity eyes: no periorbital swelling.  There is bilat proptosis external nose and ears are normal mouth: no lesion seen NECK: supple, thyroid is not enlarged, but there is a 1.5 cm left lobe nodule.   CHEST WALL: no deformity LUNGS:  Clear to auscultation BREASTS: sees gyn. CV: reg rate and rhythm, no murmur. ABD: abdomen is soft, nontender.  no hepatosplenomegaly.  not distended.  no hernia GENITALIA/RECTAL: sees gyn MUSCULOSKELETAL: muscle bulk and strength are grossly normal.  no obvious joint swelling.  gait is normal slow and slightly unsteady EXTEMITIES: no deformity.  no ulcer on the feet.  feet are of normal color and temp.  1+ bilat leg edema.  There is a healed ulcer at the right leg, now hypopigmented.  There is bilateral onychomycosis PULSES: dorsalis pedis intact bilat.  no carotid bruit NEURO:  cn 2-12 grossly intact, except speech is slurred, and there is right facial weakness.   readily moves all 4's, but muscle strength is 4/5 on the right arm and leg.  sensation is intact to touch on the right foot.  Sensation is decreased to touch on the right arm/leg/face SKIN:  Normal texture and temperature.  No rash or suspicious lesion is visible.   NODES:  None palpable at the neck PSYCH: alert, oriented x3.  Does not appear anxious nor depressed.        Assessment & Plan:  Wellness visit today, with problems stable, except as noted.

## 2011-04-20 ENCOUNTER — Ambulatory Visit (INDEPENDENT_AMBULATORY_CARE_PROVIDER_SITE_OTHER): Payer: PRIVATE HEALTH INSURANCE | Admitting: *Deleted

## 2011-04-20 DIAGNOSIS — I2699 Other pulmonary embolism without acute cor pulmonale: Secondary | ICD-10-CM

## 2011-04-20 DIAGNOSIS — I6350 Cerebral infarction due to unspecified occlusion or stenosis of unspecified cerebral artery: Secondary | ICD-10-CM

## 2011-04-20 LAB — POCT INR: INR: 3.1

## 2011-04-28 DIAGNOSIS — R3915 Urgency of urination: Secondary | ICD-10-CM | POA: Insufficient documentation

## 2011-04-28 DIAGNOSIS — N3941 Urge incontinence: Secondary | ICD-10-CM | POA: Insufficient documentation

## 2011-04-28 DIAGNOSIS — N398 Other specified disorders of urinary system: Secondary | ICD-10-CM | POA: Insufficient documentation

## 2011-05-04 ENCOUNTER — Ambulatory Visit (INDEPENDENT_AMBULATORY_CARE_PROVIDER_SITE_OTHER): Payer: PRIVATE HEALTH INSURANCE | Admitting: *Deleted

## 2011-05-04 DIAGNOSIS — I2699 Other pulmonary embolism without acute cor pulmonale: Secondary | ICD-10-CM

## 2011-05-04 DIAGNOSIS — I6350 Cerebral infarction due to unspecified occlusion or stenosis of unspecified cerebral artery: Secondary | ICD-10-CM

## 2011-05-04 LAB — POCT INR: INR: 2.1

## 2011-05-06 ENCOUNTER — Encounter: Payer: Self-pay | Admitting: Gastroenterology

## 2011-05-06 ENCOUNTER — Ambulatory Visit (INDEPENDENT_AMBULATORY_CARE_PROVIDER_SITE_OTHER): Payer: PRIVATE HEALTH INSURANCE | Admitting: Gastroenterology

## 2011-05-06 VITALS — BP 118/82 | HR 68 | Ht 64.0 in | Wt 182.2 lb

## 2011-05-06 DIAGNOSIS — Z1211 Encounter for screening for malignant neoplasm of colon: Secondary | ICD-10-CM

## 2011-05-06 DIAGNOSIS — Z7901 Long term (current) use of anticoagulants: Secondary | ICD-10-CM

## 2011-05-06 NOTE — Patient Instructions (Signed)
Follow instructions on Hemoccult cards and mail back to [place] when finished. You have been scheduled for a Air contrasted Barium Enema on 05/11/11 at 10:00am at [facility] in the Radiology department. Please arrive at 9:45 for registration. Please go across the street to the Radiology department today to pick up your prep kit for the test.  We have scheduled you a follow up visit with Dr. Loanne Drilling on 05/07/11 at 9:45am. Please contact there office if you need to reschedule at [phone]. cc: Renato Shin, MD

## 2011-05-06 NOTE — Progress Notes (Signed)
History of Present Illness: This is a 50 year old female who is disabled with multiple health problems. She is status post a CVA in October 2011 with significant residual deficits, followed at [facility] for lupus, prior renal transplant on chronic immunosuppression, and had a pulmonary embolism in February 2012 and is maintained on Coumadin anticoagulation. She is referred for colorectal cancer screening. She previously underwent colonoscopy at [facility] in January of 2002 for left lower quadrant pain and hematochezia. The colonoscopy was normal. Denies weight loss, abdominal pain, constipation, diarrhea, change in stool caliber, melena, hematochezia, nausea, vomiting, dysphagia, reflux symptoms, chest pain.  Review of Systems: Pertinent positive and negative review of systems were noted in the above HPI section. All other review of systems were otherwise negative.  Current Medications, Allergies, Past Medical History, Past Surgical History, Family History and Social History were reviewed in [employer] record.  Physical Exam: General: Well developed , well nourished,  disabled, overweight, chronically ill-appearing no acute distress Head: Normocephalic and atraumatic Eyes:  sclerae anicteric, EOMI Ears: Normal auditory acuity Mouth: No deformity or lesions Neck: Supple, no masses or thyromegaly Lungs: Clear throughout to auscultation Heart: Regular rate and rhythm; no murmurs, rubs or bruits Abdomen: Soft, non tender and non distended. No masses, hepatosplenomegaly or hernias noted. Normal Bowel sounds Musculoskeletal: Symmetrical with no gross deformities  Skin: No lesions on visible extremities Pulses:  Normal pulses noted Extremities: No clubbing, cyanosis, edema or deformities noted Neurological: Alert oriented x 4, right hemiparesis, slow speech, memory deficits  Cervical Nodes:  No significant  cervical adenopathy Inguinal Nodes: No significant inguinal adenopathy Psychological:  Alert and cooperative. Normal mood and affect  Assessment and Recommendations:  1. Colorectal cancer screening average risk for colon cancer. With her significant disability and multiple comorbidities, including a CVA with significant residual deficits, prior renal transplant on chronic immunosuppression and history of a pulmonary embolism in February on Coumadin anticoagulation, she is a high risk for complications from holding her Coumadin and from colonoscopy and sedation. After discussing all potential approaches to colorectal cancer screening she would like to proceed with stool Hemoccults and an air-contrast barium enema while on Coumadin.

## 2011-05-07 ENCOUNTER — Encounter: Payer: Self-pay | Admitting: Endocrinology

## 2011-05-07 ENCOUNTER — Ambulatory Visit (INDEPENDENT_AMBULATORY_CARE_PROVIDER_SITE_OTHER): Payer: PRIVATE HEALTH INSURANCE | Admitting: Endocrinology

## 2011-05-07 VITALS — BP 152/112 | HR 103 | Temp 98.0°F | Ht 64.0 in | Wt 182.0 lb

## 2011-05-07 DIAGNOSIS — M79602 Pain in left arm: Secondary | ICD-10-CM

## 2011-05-07 DIAGNOSIS — M79609 Pain in unspecified limb: Secondary | ICD-10-CM

## 2011-05-07 MED ORDER — ONDANSETRON HCL 4 MG PO TABS: 4.0000 mg | ORAL_TABLET | Freq: Three times a day (TID) | ORAL | Status: AC | PRN

## 2011-05-07 MED ORDER — HYDROCODONE-ACETAMINOPHEN 10-325 MG PO TABS: 1.0000 | ORAL_TABLET | Freq: Four times a day (QID) | ORAL | Status: AC | PRN

## 2011-05-07 NOTE — Patient Instructions (Addendum)
Refer to an orthopedic specialist.  you will receive a phone call, about a day and time for an appointment Here are prescriptions for pain and nausea medications.

## 2011-05-07 NOTE — Progress Notes (Signed)
Subjective:    Patient ID: Connie Ruiz, female    DOB: 1961-03-10, 50 y.o.   MRN: [id number]  HPI Pt states few mos of severe pain rad from the left upper arm to the dorsal aspect of the left hand.  She is unable to further localize it.  He is unable to cite precip factor such as local injury.  Denies assoc numbness.  She gets nausea with narcotics.   Past Medical History  Diagnosis Date  . THYROID NODULE, LEFT 04/10/2009  . DIABETES MELLITUS, TYPE II 08/21/2007  . HYPERLIPIDEMIA 08/21/2007  . GOUT 08/21/2007  . HYPERTENSION 08/21/2007    Dr. Andree Elk, [place] 08/21/2007  . CVA 04/17/2010  . CEREBROVASCULAR ACCIDENT, ACUTE 04/15/2010  . GERD 08/21/2007  . RENAL INSUFFICIENCY 08/21/2007  . LUPUS 08/21/2007  . OSTEOPOROSIS 08/21/2007    R[facility]umatol at [facility]  . DVT, HX OF 08/21/2007  . CLOSTRIDIUM DIFFICILE COLITIS, HX OF 08/21/2007  . KIDNEY TRANSPLANTATION, HX OF 08/22/2007    s/p renal transplant-Dr. Andree Elk, [facility]  . Pulmonary embolism 07/16/2010  . Renal failure   . Current use of long term anticoagulation     Dr. Andree Elk, [facility]  . Depression     Dr. Andree Elk, [facility]    Past Surgical History  Procedure Date  . Cholecystectomy   . Tubal ligation     History   Social History  . Marital Status: Divorced    Spouse Name: N/A    Number of Children: 1  . Years of Education: N/A   Occupational History  . DISABILITY    Social History Main Topics  . Smoking status: Never Smoker   . Smokeless tobacco: Not on file  . Alcohol Use: No  . Drug Use: No  . Sexually Active:    Other Topics Concern  . Not on file   Social History Narrative   RetiredRegular exercise-no    Current Outpatient Prescriptions on File Prior to Visit  Medication Sig Dispense Refill  . calcitRIOL (ROCALTROL) 0.25 MCG capsule Take 1 capsule (0.25 mcg total) by mouth daily.  30 capsule  11  . diltiazem (DILACOR XR) 180 MG 24 hr capsule Take 180 mg by mouth daily.        Marland Kitchen esomeprazole  (NEXIUM) 40 MG capsule Take 40 mg by mouth daily.        . folic acid (FOLVITE) 1 MG tablet Take 1 mg by mouth daily.        Marland Kitchen glimepiride (AMARYL) 1 MG tablet Take 1 tablet (1 mg total) by mouth daily before breakfast.  30 tablet  11  . glucose blood (PRODIGY NO CODING BLOOD GLUC) test strip Use as instructed once daily dx 250.00      . guaiFENesin (MUCINEX) 600 MG 12 hr tablet Take 1,200 mg by mouth 2 (two) times daily. For cough symptoms       . mycophenolate (CELLCEPT) 250 MG capsule Take 250 mg by mouth daily.        . predniSONE (DELTASONE) 1 MG tablet Take 3 tablets by mouth once daily       . sertraline (ZOLOFT) 100 MG tablet Take 100 mg by mouth daily.        . tacrolimus (PROGRAF) 1 MG capsule Take 1 mg by mouth 2 (two) times daily.        Marland Kitchen warfarin (COUMADIN) 1 MG tablet USE AS DIRECTED BY ANTICOAGUALTION CLINIC  45 tablet  2  . warfarin (COUMADIN) 4  MG tablet TAKE AS DIRECTED  45 tablet  2  . ZINC SULFATE PO Take 1 tablet by mouth daily.          Allergies  Allergen Reactions  . Codeine     REACTION: nausea  . Oxycodone-Acetaminophen   . Propoxyphene N-Acetaminophen   . Sulfonamide Derivatives     Family History  Problem Relation Age of Onset  . Cancer Neg Hx   . Heart attack Mother   . Heart disease Father     BP 152/112  Pulse 103  Temp(Src) 98 F (36.7 C) (Oral)  Ht 5\' 4"  (1.626 m)  Wt 182 lb (82.555 kg)  BMI 31.24 kg/m2  SpO2 99%  Review of Systems Denies rash and fever.      Objective:   Physical Exam VITAL SIGNS:  See vs page GENERAL: no distress Left upper extremity: Exquisite sensitivity to any palpation or rom of any joint.  No rash.       Assessment & Plan:  Pain, uncertain etiology, new HTN, prob exac by pain Coumadin use.  This precludes nsaids.

## 2011-05-11 ENCOUNTER — Other Ambulatory Visit: Payer: Self-pay | Admitting: Gastroenterology

## 2011-05-11 ENCOUNTER — Ambulatory Visit (HOSPITAL_COMMUNITY)
Admission: RE | Admit: 2011-05-11 | Discharge: 2011-05-11 | Disposition: A | Discharge status: Home / Self Care | Payer: PRIVATE HEALTH INSURANCE | Source: Ambulatory Visit | Attending: Gastroenterology | Admitting: Gastroenterology

## 2011-05-11 DIAGNOSIS — Z7901 Long term (current) use of anticoagulants: Secondary | ICD-10-CM | POA: Insufficient documentation

## 2011-05-11 DIAGNOSIS — K5730 Diverticulosis of large intestine without perforation or abscess without bleeding: Secondary | ICD-10-CM | POA: Insufficient documentation

## 2011-05-11 DIAGNOSIS — Z1211 Encounter for screening for malignant neoplasm of colon: Secondary | ICD-10-CM

## 2011-05-20 DIAGNOSIS — I10 Essential (primary) hypertension: Secondary | ICD-10-CM | POA: Insufficient documentation

## 2011-05-20 DIAGNOSIS — Z79899 Other long term (current) drug therapy: Secondary | ICD-10-CM | POA: Insufficient documentation

## 2011-05-20 DIAGNOSIS — Z940 Kidney transplant status: Secondary | ICD-10-CM | POA: Insufficient documentation

## 2011-05-21 ENCOUNTER — Telehealth: Payer: Self-pay | Admitting: Endocrinology

## 2011-05-21 DIAGNOSIS — I6350 Cerebral infarction due to unspecified occlusion or stenosis of unspecified cerebral artery: Secondary | ICD-10-CM

## 2011-05-21 NOTE — Telephone Encounter (Signed)
done

## 2011-05-21 NOTE — Telephone Encounter (Signed)
The pt's daughter called and is requesting a speech therapy referral.  Please call her daugther back if this is possible at [phone].  Thanks!

## 2011-05-21 NOTE — Telephone Encounter (Signed)
Pt's daughter informed of referral.

## 2011-05-25 ENCOUNTER — Encounter: Payer: PRIVATE HEALTH INSURANCE | Admitting: *Deleted

## 2011-05-27 ENCOUNTER — Ambulatory Visit (INDEPENDENT_AMBULATORY_CARE_PROVIDER_SITE_OTHER): Payer: PRIVATE HEALTH INSURANCE | Admitting: *Deleted

## 2011-05-27 DIAGNOSIS — I6350 Cerebral infarction due to unspecified occlusion or stenosis of unspecified cerebral artery: Secondary | ICD-10-CM

## 2011-05-27 DIAGNOSIS — I2699 Other pulmonary embolism without acute cor pulmonale: Secondary | ICD-10-CM

## 2011-05-27 LAB — POCT INR: INR: 4.2

## 2011-06-07 ENCOUNTER — Encounter: Payer: PRIVATE HEALTH INSURANCE | Admitting: *Deleted

## 2011-06-09 ENCOUNTER — Ambulatory Visit: Payer: PRIVATE HEALTH INSURANCE

## 2011-06-10 ENCOUNTER — Ambulatory Visit: Payer: PRIVATE HEALTH INSURANCE | Admitting: Internal Medicine

## 2011-06-10 ENCOUNTER — Ambulatory Visit (INDEPENDENT_AMBULATORY_CARE_PROVIDER_SITE_OTHER): Payer: PRIVATE HEALTH INSURANCE | Admitting: *Deleted

## 2011-06-10 DIAGNOSIS — I6350 Cerebral infarction due to unspecified occlusion or stenosis of unspecified cerebral artery: Secondary | ICD-10-CM

## 2011-06-10 DIAGNOSIS — I2699 Other pulmonary embolism without acute cor pulmonale: Secondary | ICD-10-CM

## 2011-06-10 LAB — POCT INR: INR: 1.1

## 2011-06-11 ENCOUNTER — Other Ambulatory Visit (INDEPENDENT_AMBULATORY_CARE_PROVIDER_SITE_OTHER): Payer: PRIVATE HEALTH INSURANCE

## 2011-06-11 ENCOUNTER — Ambulatory Visit: Payer: PRIVATE HEALTH INSURANCE | Attending: Endocrinology

## 2011-06-11 ENCOUNTER — Encounter: Payer: Self-pay | Admitting: Endocrinology

## 2011-06-11 ENCOUNTER — Ambulatory Visit (INDEPENDENT_AMBULATORY_CARE_PROVIDER_SITE_OTHER): Payer: PRIVATE HEALTH INSURANCE | Admitting: Endocrinology

## 2011-06-11 VITALS — BP 134/90 | HR 93 | Temp 98.3°F | Ht 64.0 in | Wt 185.1 lb

## 2011-06-11 DIAGNOSIS — IMO0001 Reserved for inherently not codable concepts without codable children: Secondary | ICD-10-CM | POA: Insufficient documentation

## 2011-06-11 DIAGNOSIS — R4789 Other speech disturbances: Secondary | ICD-10-CM

## 2011-06-11 DIAGNOSIS — R32 Unspecified urinary incontinence: Secondary | ICD-10-CM

## 2011-06-11 DIAGNOSIS — M79609 Pain in unspecified limb: Secondary | ICD-10-CM

## 2011-06-11 DIAGNOSIS — R4702 Dysphasia: Secondary | ICD-10-CM | POA: Insufficient documentation

## 2011-06-11 DIAGNOSIS — R4701 Aphasia: Secondary | ICD-10-CM | POA: Insufficient documentation

## 2011-06-11 DIAGNOSIS — M79642 Pain in left hand: Secondary | ICD-10-CM

## 2011-06-11 LAB — BASIC METABOLIC PANEL
BUN: 13 mg/dL (ref 6–23)
CO2: 24 mEq/L (ref 19–32)
Calcium: 8.9 mg/dL (ref 8.4–10.5)
Chloride: 108 mEq/L (ref 96–112)
Creatinine, Ser: 0.6 mg/dL (ref 0.4–1.2)
GFR: 141.13 mL/min (ref >60.00)
Glucose, Bld: 103 mg/dL — ABNORMAL HIGH (ref 70–99)
Potassium: 3.3 mEq/L — ABNORMAL LOW (ref 3.5–5.1)
Sodium: 144 mEq/L (ref 135–145)

## 2011-06-11 NOTE — Progress Notes (Signed)
Subjective:    Patient ID: Connie Ruiz, female    DOB: 1960/06/12, 51 y.o.   MRN: [id number]  HPI Pt had cva in 2011.  She last saw dr Leonie Man in 2011.  She now c/o few weeks of slight motor aphasia, and assoc forgetfulness.  She sometimes forgets the coumadin.   Past Medical History  Diagnosis Date  . THYROID NODULE, LEFT 04/10/2009  . DIABETES MELLITUS, TYPE II 08/21/2007  . HYPERLIPIDEMIA 08/21/2007  . GOUT 08/21/2007  . HYPERTENSION 08/21/2007    Dr. Andree Elk, [place] 08/21/2007  . CVA 04/17/2010  . CEREBROVASCULAR ACCIDENT, ACUTE 04/15/2010  . GERD 08/21/2007  . RENAL INSUFFICIENCY 08/21/2007  . LUPUS 08/21/2007  . OSTEOPOROSIS 08/21/2007    [facility]umatol at [facility]  . DVT, HX OF 08/21/2007  . CLOSTRIDIUM DIFFICILE COLITIS, HX OF 08/21/2007  . KIDNEY TRANSPLANTATION, HX OF 08/22/2007    s/p renal transplant-Dr. Andree Elk, [facility]  . Pulmonary embolism 07/16/2010  . Renal failure   . Current use of long term anticoagulation     Dr. Andree Elk, [facility]  . Depression     Dr. Andree Elk, [facility]    Past Surgical History  Procedure Date  . Cholecystectomy   . Tubal ligation     History   Social History  . Marital Status: Divorced    Spouse Name: N/A    Number of Children: 1  . Years of Education: N/A   Occupational History  . DISABILITY    Social History Main Topics  . Smoking status: Never Smoker   . Smokeless tobacco: Not on file  . Alcohol Use: No  . Drug Use: No  . Sexually Active:    Other Topics Concern  . Not on file   Social History Narrative   RetiredRegular exercise-no    Current Outpatient Prescriptions on File Prior to Visit  Medication Sig Dispense Refill  . calcitRIOL (ROCALTROL) 0.25 MCG capsule Take 1 capsule (0.25 mcg total) by mouth daily.  30 capsule  11  . diltiazem (DILACOR XR) 180 MG 24 hr capsule Take 180 mg by mouth daily.        Marland Kitchen esomeprazole (NEXIUM) 40 MG capsule Take 40 mg by mouth daily.        . folic acid (FOLVITE) 1 MG  tablet Take 1 mg by mouth daily.        Marland Kitchen glimepiride (AMARYL) 1 MG tablet Take 1 tablet (1 mg total) by mouth daily before breakfast.  30 tablet  11  . glucose blood (PRODIGY NO CODING BLOOD GLUC) test strip Use as instructed once daily dx 250.00      . guaiFENesin (MUCINEX) 600 MG 12 hr tablet Take 1,200 mg by mouth 2 (two) times daily. For cough symptoms       . mycophenolate (CELLCEPT) 250 MG capsule Take 250 mg by mouth daily.        . predniSONE (DELTASONE) 1 MG tablet Take 3 tablets by mouth once daily       . sertraline (ZOLOFT) 100 MG tablet Take 100 mg by mouth daily.        . tacrolimus (PROGRAF) 1 MG capsule Take 1 mg by mouth 2 (two) times daily.        Marland Kitchen warfarin (COUMADIN) 1 MG tablet USE AS DIRECTED BY ANTICOAGUALTION CLINIC  45 tablet  2  . warfarin (COUMADIN) 4 MG tablet TAKE AS DIRECTED  45 tablet  2  . ZINC SULFATE PO Take 1 tablet by  mouth daily.          Allergies  Allergen Reactions  . Codeine     REACTION: nausea  . Oxycodone-Acetaminophen   . Propoxyphene N-Acetaminophen   . Sulfonamide Derivatives     Family History  Problem Relation Age of Onset  . Cancer Neg Hx   . Heart attack Mother   . Heart disease Father     BP 134/90  Pulse 93  Temp(Src) 98.3 F (36.8 C) (Oral)  Ht 5\' 4"  (1.626 m)  Wt 185 lb 1.9 oz (83.97 kg)  BMI 31.78 kg/m2  SpO2 96%    Review of Systems  Constitutional:       She has weight gain  HENT: Negative for hearing loss.   Respiratory: Negative for shortness of breath.   Gastrointestinal: Negative for diarrhea.  Musculoskeletal: Positive for myalgias.  Skin: Positive for rash.  Neurological: Negative for seizures.  Psychiatric/Behavioral: Positive for dysphoric mood.   She also has slight blurry vision x a few weeks.  Denies loc.  She has urinary incontinence.  She has pain at her left hand (no injury, but she wants to see hand specialist--she already saw ortho).      Objective:   Physical Exam VITAL SIGNS:  See vs  page GENERAL: no distress Gait: slow but steady, except she says her right leg is weak (chronic). Neuro: sensation is intact to touch on the lower extremities. She says the year is 2013, but she has to says each digit slowly and individually.   She can repeat "no ifs, ands, or buts," but very slowly.    (i reviewed today's lab results) (i reviewed old chart, with last note by dr Leonie Man)    Assessment & Plan:  Speech difficulty, new.  ? New cva Noncompliance with coumadin.  This causes high risk to her health Hand pain, needs increased rx Urinary incontinence, prob related to the poor overall state of her health.

## 2011-06-11 NOTE — Patient Instructions (Addendum)
blood tests are being requested for you today.  please call [phone] to hear your test results.  You will be prompted to enter the 9-digit "MRN" number that appears at the top left of this page, followed by #.  Then you will hear the message. Also, please recheck the mri.  you will receive a phone call, about a day and time for an appointment Refer back to dr Leonie Man.  you will receive a phone call, about a day and time for an appointment It is crtitically important to remember the coumadin. here is a prescription for bed liners. Refer to a hand specialist.  you will receive a phone call, about a day and time for an appointment

## 2011-06-14 ENCOUNTER — Telehealth: Payer: Self-pay | Admitting: *Deleted

## 2011-06-14 NOTE — Telephone Encounter (Signed)
Speech Pathologist from [facility] called on behalf of pt. He is currently seeing pt for her speech, pt is complaining of right leg weakness and they recommend that pt be seen for PT eval-need rx for PT eval for current pt to be sent to [facility]

## 2011-06-14 NOTE — Telephone Encounter (Signed)
Pt was ref to neurol.  Let's see what they say

## 2011-06-15 ENCOUNTER — Ambulatory Visit: Payer: PRIVATE HEALTH INSURANCE

## 2011-06-16 NOTE — Telephone Encounter (Signed)
Anne Ng at [facility] informed of MD's advisement.

## 2011-06-17 ENCOUNTER — Observation Stay (HOSPITAL_COMMUNITY)
Admission: EM | Admit: 2011-06-17 | Discharge: 2011-06-19 | Disposition: A | Discharge status: Home / Self Care | Payer: PRIVATE HEALTH INSURANCE | Attending: Internal Medicine | Admitting: Internal Medicine

## 2011-06-17 ENCOUNTER — Emergency Department (HOSPITAL_COMMUNITY): Payer: PRIVATE HEALTH INSURANCE

## 2011-06-17 ENCOUNTER — Encounter (HOSPITAL_COMMUNITY): Payer: Self-pay | Admitting: Emergency Medicine

## 2011-06-17 ENCOUNTER — Ambulatory Visit: Payer: PRIVATE HEALTH INSURANCE

## 2011-06-17 DIAGNOSIS — I6350 Cerebral infarction due to unspecified occlusion or stenosis of unspecified cerebral artery: Secondary | ICD-10-CM | POA: Diagnosis present

## 2011-06-17 DIAGNOSIS — M7989 Other specified soft tissue disorders: Principal | ICD-10-CM | POA: Insufficient documentation

## 2011-06-17 DIAGNOSIS — R4701 Aphasia: Secondary | ICD-10-CM | POA: Diagnosis present

## 2011-06-17 DIAGNOSIS — Z940 Kidney transplant status: Secondary | ICD-10-CM

## 2011-06-17 DIAGNOSIS — M79604 Pain in right leg: Secondary | ICD-10-CM

## 2011-06-17 DIAGNOSIS — R413 Other amnesia: Secondary | ICD-10-CM | POA: Insufficient documentation

## 2011-06-17 DIAGNOSIS — M79609 Pain in unspecified limb: Secondary | ICD-10-CM | POA: Insufficient documentation

## 2011-06-17 DIAGNOSIS — E119 Type 2 diabetes mellitus without complications: Secondary | ICD-10-CM | POA: Insufficient documentation

## 2011-06-17 DIAGNOSIS — I82409 Acute embolism and thrombosis of unspecified deep veins of unspecified lower extremity: Secondary | ICD-10-CM | POA: Diagnosis present

## 2011-06-17 DIAGNOSIS — I10 Essential (primary) hypertension: Secondary | ICD-10-CM | POA: Diagnosis present

## 2011-06-17 DIAGNOSIS — I69920 Aphasia following unspecified cerebrovascular disease: Secondary | ICD-10-CM | POA: Insufficient documentation

## 2011-06-17 DIAGNOSIS — M329 Systemic lupus erythematosus, unspecified: Secondary | ICD-10-CM | POA: Diagnosis present

## 2011-06-17 DIAGNOSIS — Z86711 Personal history of pulmonary embolism: Secondary | ICD-10-CM | POA: Insufficient documentation

## 2011-06-17 DIAGNOSIS — Z86718 Personal history of other venous thrombosis and embolism: Secondary | ICD-10-CM | POA: Insufficient documentation

## 2011-06-17 LAB — COMPREHENSIVE METABOLIC PANEL
ALT: 20 U/L (ref 0–35)
AST: 25 U/L (ref 0–37)
Albumin: 3.7 g/dL (ref 3.5–5.2)
Alkaline Phosphatase: 81 U/L (ref 39–117)
BUN: 17 mg/dL (ref 6–23)
CO2: 23 mEq/L (ref 19–32)
Calcium: 9.5 mg/dL (ref 8.4–10.5)
Chloride: 108 mEq/L (ref 96–112)
Creatinine, Ser: 0.65 mg/dL (ref 0.50–1.10)
GFR calc Af Amer: >90 mL/min (ref >90)
GFR calc non Af Amer: >90 mL/min (ref >90)
Glucose, Bld: 115 mg/dL — ABNORMAL HIGH (ref 70–99)
Potassium: 4.1 mEq/L (ref 3.5–5.1)
Sodium: 140 mEq/L (ref 135–145)
Total Bilirubin: 0.3 mg/dL (ref 0.3–1.2)
Total Protein: 7.2 g/dL (ref 6.0–8.3)

## 2011-06-17 LAB — DIFFERENTIAL
Basophils Absolute: 0 10*3/uL (ref 0.0–0.1)
Basophils Relative: 1 % (ref 0–1)
Eosinophils Absolute: 0 10*3/uL (ref 0.0–0.7)
Eosinophils Relative: 0 % (ref 0–5)
Lymphocytes Relative: 40 % (ref 12–46)
Lymphs Abs: 2.9 10*3/uL (ref 0.7–4.0)
Monocytes Absolute: 0.5 10*3/uL (ref 0.1–1.0)
Monocytes Relative: 7 % (ref 3–12)
Neutro Abs: 3.7 10*3/uL (ref 1.7–7.7)
Neutrophils Relative %: 52 % (ref 43–77)

## 2011-06-17 LAB — URINALYSIS, ROUTINE W REFLEX MICROSCOPIC
Glucose, UA: NEGATIVE mg/dL
Hgb urine dipstick: NEGATIVE
Ketones, ur: NEGATIVE mg/dL
Leukocytes, UA: NEGATIVE
Nitrite: NEGATIVE
Protein, ur: NEGATIVE mg/dL
Specific Gravity, Urine: 1.025 (ref 1.005–1.030)
Urobilinogen, UA: 1 mg/dL (ref 0.0–1.0)
pH: 6.5 (ref 5.0–8.0)

## 2011-06-17 LAB — PROTIME-INR
INR: 1.74 — ABNORMAL HIGH (ref 0.00–1.49)
Prothrombin Time: 20.7 seconds — ABNORMAL HIGH (ref 11.6–15.2)

## 2011-06-17 LAB — CBC
HCT: 42.6 % (ref 36.0–46.0)
Hemoglobin: 14.4 g/dL (ref 12.0–15.0)
MCH: 30.3 pg (ref 26.0–34.0)
MCHC: 33.8 g/dL (ref 30.0–36.0)
MCV: 89.7 fL (ref 78.0–100.0)
Platelets: 161 10*3/uL (ref 150–400)
RBC: 4.75 MIL/uL (ref 3.87–5.11)
RDW: 13.5 % (ref 11.5–15.5)
WBC: 7.2 10*3/uL (ref 4.0–10.5)

## 2011-06-17 LAB — GLUCOSE, CAPILLARY: Glucose-Capillary: 118 mg/dL — ABNORMAL HIGH (ref 70–99)

## 2011-06-17 LAB — URINE CULTURE
Colony Count: NO GROWTH
Culture  Setup Time: 201301101911
Culture: NO GROWTH

## 2011-06-17 MED ORDER — GUAIFENESIN ER 600 MG PO TB12
1200.0000 mg | ORAL_TABLET | Freq: Two times a day (BID) | ORAL | Status: DC | PRN
  Filled 2011-06-17: qty 2

## 2011-06-17 MED ORDER — FOLIC ACID 1 MG PO TABS
1.0000 mg | ORAL_TABLET | Freq: Every day | ORAL | Status: DC
  Administered 2011-06-18 – 2011-06-19 (×2): 1 mg via ORAL
  Filled 2011-06-17 (×2): qty 1

## 2011-06-17 MED ORDER — CALCITRIOL 0.25 MCG PO CAPS
0.2500 ug | ORAL_CAPSULE | Freq: Every day | ORAL | Status: DC
  Administered 2011-06-18 – 2011-06-19 (×2): 0.25 ug via ORAL
  Filled 2011-06-17 (×2): qty 1

## 2011-06-17 MED ORDER — SERTRALINE HCL 100 MG PO TABS
100.0000 mg | ORAL_TABLET | Freq: Every day | ORAL | Status: DC
  Administered 2011-06-18 – 2011-06-19 (×2): 100 mg via ORAL
  Filled 2011-06-17 (×2): qty 1

## 2011-06-17 MED ORDER — TACROLIMUS 0.5 MG PO CAPS
0.5000 mg | ORAL_CAPSULE | Freq: Two times a day (BID) | ORAL | Status: DC
  Administered 2011-06-18 – 2011-06-19 (×3): 0.5 mg via ORAL
  Filled 2011-06-17 (×5): qty 1

## 2011-06-17 MED ORDER — TACROLIMUS 1 MG PO CAPS
1.0000 mg | ORAL_CAPSULE | Freq: Two times a day (BID) | ORAL | Status: DC
  Administered 2011-06-18 – 2011-06-19 (×3): 1 mg via ORAL
  Filled 2011-06-17 (×5): qty 1

## 2011-06-17 MED ORDER — MYCOPHENOLATE MOFETIL 250 MG PO CAPS
250.0000 mg | ORAL_CAPSULE | Freq: Every day | ORAL | Status: DC
  Administered 2011-06-18 – 2011-06-19 (×2): 250 mg via ORAL
  Filled 2011-06-17 (×2): qty 1

## 2011-06-17 MED ORDER — GLIMEPIRIDE 1 MG PO TABS
1.0000 mg | ORAL_TABLET | Freq: Every day | ORAL | Status: DC
  Administered 2011-06-18 – 2011-06-19 (×2): 1 mg via ORAL
  Filled 2011-06-17 (×3): qty 1

## 2011-06-17 MED ORDER — SODIUM CHLORIDE 0.9 % IJ SOLN
3.0000 mL | Freq: Two times a day (BID) | INTRAMUSCULAR | Status: DC
  Administered 2011-06-18 – 2011-06-19 (×2): 3 mL via INTRAVENOUS

## 2011-06-17 MED ORDER — INSULIN ASPART 100 UNIT/ML ~~LOC~~ SOLN: 0.0000 [IU] | Freq: Three times a day (TID) | SUBCUTANEOUS | Status: DC

## 2011-06-17 MED ORDER — ACETAMINOPHEN 325 MG PO TABS: 650.0000 mg | ORAL_TABLET | Freq: Four times a day (QID) | ORAL | Status: DC | PRN

## 2011-06-17 MED ORDER — WARFARIN SODIUM 7.5 MG PO TABS
7.5000 mg | ORAL_TABLET | Freq: Once | ORAL | Status: DC
  Filled 2011-06-17: qty 1

## 2011-06-17 MED ORDER — ACETAMINOPHEN 650 MG RE SUPP: 650.0000 mg | Freq: Four times a day (QID) | RECTAL | Status: DC | PRN

## 2011-06-17 MED ORDER — PREDNISONE 1 MG PO TABS
1.0000 mg | ORAL_TABLET | Freq: Three times a day (TID) | ORAL | Status: DC
  Filled 2011-06-17 (×4): qty 1

## 2011-06-17 MED ORDER — ENOXAPARIN SODIUM 100 MG/ML ~~LOC~~ SOLN
85.0000 mg | Freq: Once | SUBCUTANEOUS | Status: AC
  Administered 2011-06-17: 85 mg via SUBCUTANEOUS
  Filled 2011-06-17 (×2): qty 1

## 2011-06-17 MED ORDER — ONDANSETRON HCL 4 MG PO TABS: 4.0000 mg | ORAL_TABLET | Freq: Four times a day (QID) | ORAL | Status: DC | PRN

## 2011-06-17 MED ORDER — DILTIAZEM HCL ER 180 MG PO CP24
180.0000 mg | ORAL_CAPSULE | Freq: Every day | ORAL | Status: DC
  Administered 2011-06-18 – 2011-06-19 (×2): 180 mg via ORAL
  Filled 2011-06-17 (×2): qty 1

## 2011-06-17 MED ORDER — PANTOPRAZOLE SODIUM 40 MG PO TBEC
40.0000 mg | DELAYED_RELEASE_TABLET | Freq: Every day | ORAL | Status: DC
  Administered 2011-06-18 – 2011-06-19 (×2): 40 mg via ORAL
  Filled 2011-06-17: qty 1

## 2011-06-17 MED ORDER — ENOXAPARIN SODIUM 100 MG/ML ~~LOC~~ SOLN
85.0000 mg | Freq: Two times a day (BID) | SUBCUTANEOUS | Status: DC
  Filled 2011-06-17: qty 1

## 2011-06-17 MED ORDER — ONDANSETRON HCL 4 MG/2ML IJ SOLN: 4.0000 mg | Freq: Four times a day (QID) | INTRAMUSCULAR | Status: DC | PRN

## 2011-06-17 NOTE — Progress Notes (Signed)
ANTICOAGULATION CONSULT NOTE - Initial Consult  Pharmacy Consult for Lovenox, Coumadin Indication: DVT  Allergies  Allergen Reactions  . Oxycodone-Acetaminophen Shortness Of Breath and Nausea Only  . Propoxyphene N-Acetaminophen Shortness Of Breath and Nausea Only  . Sulfonamide Derivatives Shortness Of Breath and Nausea Only  . Codeine     REACTION: nausea  . Latex     Patient Measurements: Weight: 185 lb (83.915 kg) Adjusted Body Weight:   Vital Signs: Temp: 98.1 F (36.7 C) (01/10 1853) Temp src: Oral (01/10 1853) BP: 146/99 mmHg (01/10 1853) Pulse Rate: 91  (01/10 1853)  Labs:  Basename 06/17/11 1812  HGB 14.4  HCT 42.6  PLT 161  APTT --  LABPROT 20.7*  INR 1.74*  HEPARINUNFRC --  CREATININE 0.65  CKTOTAL --  CKMB --  TROPONINI --   The CrCl is unknown because both a height and weight (above a minimum accepted value) are required for this calculation.  Medical History: Past Medical History  Diagnosis Date  . THYROID NODULE, LEFT 04/10/2009  . DIABETES MELLITUS, TYPE II 08/21/2007  . HYPERLIPIDEMIA 08/21/2007  . GOUT 08/21/2007  . HYPERTENSION 08/21/2007    Dr. Andree Elk, [place] 08/21/2007  . CVA 04/17/2010  . CEREBROVASCULAR ACCIDENT, ACUTE 04/15/2010  . GERD 08/21/2007  . RENAL INSUFFICIENCY 08/21/2007  . LUPUS 08/21/2007  . OSTEOPOROSIS 08/21/2007    Rheumatol at [facility]  . DVT, HX OF 08/21/2007  . CLOSTRIDIUM DIFFICILE COLITIS, HX OF 08/21/2007  . KIDNEY TRANSPLANTATION, HX OF 08/22/2007    s/p renal transplant-Dr. Andree Elk, [facility]  . Pulmonary embolism 07/16/2010  . Renal failure   . Current use of long term anticoagulation     Dr. Andree Elk, [facility]  . Depression     Dr. Andree Elk, [facility]    Medications:  Scheduled:    . enoxaparin  85 mg Subcutaneous Once    Assessment: 51 yr old female with history of antiphospholipid antibody syndrome, renal transplant on immunosuppressants and with h/o PE, CVA, Recurrent DVT to be admitted  with swelling in rt lower extremity and subtherapeutic INR (1.74). She takes Coumadin 5 mg daily. Goal of Therapy:  INR 2-3   Plan:  She got 85 mg of lovenox in the ED. Lovenox to continue at 85 mg q12hr until INR is therapeutic. Coumadin 7.5 mg tonight and daily INR.  Minta Balsam 06/17/2011,9:34 PM

## 2011-06-17 NOTE — ED Notes (Signed)
Pt aware of orders place. Pt states she will alert this rn when she is able to urinate

## 2011-06-17 NOTE — ED Notes (Signed)
Called [place] to see status of R leg study. Tech informed this Rn that she called and left a message stating that the incorrect order was placed but this RN was not notified. Schinlever notified. Dr. Roxanne Mins at bedside

## 2011-06-17 NOTE — ED Notes (Signed)
Pt c/o having trouble speaking, memory loss, and right leg pain since Monday. NAD noted at this time. Neuro intact at this time.

## 2011-06-17 NOTE — ED Notes (Signed)
Pt failed swallow screen. Place pt NPO and ordered ST swallow eval

## 2011-06-17 NOTE — ED Provider Notes (Signed)
51 year old female with a history of DVT and a history of lupus with antiphospholipid syndrome comes in with increasing swelling of her right leg and pain in her right leg. She states that the last INR she had was subtherapeutic and her dose was increased and she is supposed to have her INR checked again in 4 days. Denies chest pain. She denies dyspnea. On exam, her right calf and distal thigh are significantly swollen compared with the left. There is some induration which may actually be a cord but I cannot tell that for certain. She does not meet criteria for CDU protocol and the protocol to return for a Doppler test in the morning because of her history of DVT and antiphospholipid antibody syndrome. Arrangements will be made to admit the patient and she will be given Lovenox in the ED.  Delora Fuel, MD [id number]

## 2011-06-17 NOTE — H&P (Signed)
Connie Ruiz is an 51 y.o. female.   PCP - Dr.Ellison. Chief Complaint: Right lower extremity swelling. HPI: 51 year-old female with history of antiphospholipid antibody syndrome, renal transplant on immunosuppressants, history of recurrent CVA, history of recurrent DVT and history of pulmonary embolism, diabetes mellitus type 2 present to the ER because increasing swelling of the right lower extremity over the last 4 days. Patient also in addition has been having expressive aphasia over the last 3 weeks and had gone to her PCP 2 weeks ago and PCP had ordered MRI brain as an outpatient which is still pending. The patient denies any focal deficits or difficulty swallowing or any visual symptoms. Patient did have a CT head today which did not show any acute. Patient denies any nausea vomiting abdominal pain or diarrhea. Denies any chest pain shortness of breath fever or chills. Patient does have swelling in the right lower extremity which makes it difficult to walk. But denies any severe pain. Her INR is subtherapeutic and Lovenox was given for possible DVT. Dopplers of been ordered for lower extremity and it will be done only tomorrow morning.  Past Medical History  Diagnosis Date  . THYROID NODULE, LEFT 04/10/2009  . DIABETES MELLITUS, TYPE II 08/21/2007  . HYPERLIPIDEMIA 08/21/2007  . GOUT 08/21/2007  . HYPERTENSION 08/21/2007    Dr. Andree Elk, [place] 08/21/2007  . CVA 04/17/2010  . CEREBROVASCULAR ACCIDENT, ACUTE 04/15/2010  . GERD 08/21/2007  . RENAL INSUFFICIENCY 08/21/2007  . LUPUS 08/21/2007  . OSTEOPOROSIS 08/21/2007    Rheumatol at [facility]  . DVT, HX OF 08/21/2007  . CLOSTRIDIUM DIFFICILE COLITIS, HX OF 08/21/2007  . KIDNEY TRANSPLANTATION, HX OF 08/22/2007    s/p renal transplant-Dr. Andree Elk, [facility]  . Pulmonary embolism 07/16/2010  . Renal failure   . Current use of long term anticoagulation     Dr. Andree Elk, [facility]  . Depression     Dr. Andree Elk, [facility]    Past  Surgical History  Procedure Date  . Cholecystectomy   . Tubal ligation     Family History  Problem Relation Age of Onset  . Cancer Neg Hx   . Heart attack Mother   . Heart disease Father    Social History:  reports that she has never smoked. She does not have any smokeless tobacco history on file. She reports that she does not drink alcohol or use illicit drugs.  Allergies:  Allergies  Allergen Reactions  . Oxycodone-Acetaminophen Shortness Of Breath and Nausea Only  . Propoxyphene N-Acetaminophen Shortness Of Breath and Nausea Only  . Sulfonamide Derivatives Shortness Of Breath and Nausea Only  . Codeine     REACTION: nausea  . Latex     Medications Prior to Admission  Medication Dose Route Frequency Provider Last Rate Last Dose  . enoxaparin (LOVENOX) injection 85 mg  85 mg Subcutaneous Once Ronalee Belts Schinlever   85 mg at 06/17/11 2044   Medications Prior to Admission  Medication Sig Dispense Refill  . calcitRIOL (ROCALTROL) 0.25 MCG capsule Take 1 capsule (0.25 mcg total) by mouth daily.  30 capsule  11  . diltiazem (DILACOR XR) 180 MG 24 hr capsule Take 180 mg by mouth daily.        Marland Kitchen esomeprazole (NEXIUM) 40 MG capsule Take 40 mg by mouth daily.        . folic acid (FOLVITE) 1 MG tablet Take 1 mg by mouth daily.        Marland Kitchen glimepiride (AMARYL)  1 MG tablet Take 1 tablet (1 mg total) by mouth daily before breakfast.  30 tablet  11  . guaiFENesin (MUCINEX) 600 MG 12 hr tablet Take 1,200 mg by mouth 2 (two) times daily as needed. For cough symptoms      . mycophenolate (CELLCEPT) 250 MG capsule Take 250 mg by mouth daily.        . predniSONE (DELTASONE) 1 MG tablet Take 3 tablets by mouth once daily       . sertraline (ZOLOFT) 100 MG tablet Take 100 mg by mouth daily.        . tacrolimus (PROGRAF) 1 MG capsule Take 1 mg by mouth 2 (two) times daily.        Marland Kitchen ZINC SULFATE PO Take 1 tablet by mouth daily. OTC.      Marland Kitchen glucose blood (PRODIGY NO CODING BLOOD GLUC) test strip Use as  instructed once daily dx 250.00        Results for orders placed during the hospital encounter of 06/17/11 (from the past 48 hour(s))  URINALYSIS, ROUTINE W REFLEX MICROSCOPIC     Status: Abnormal   Collection Time   06/17/11  5:55 PM      Component Value Range Comment   Color, Urine YELLOW  YELLOW     APPearance HAZY (*) CLEAR     Specific Gravity, Urine 1.025  1.005 - 1.030     pH 6.5  5.0 - 8.0     Glucose, UA NEGATIVE  NEGATIVE (mg/dL)    Hgb urine dipstick NEGATIVE  NEGATIVE     Bilirubin Urine SMALL (*) NEGATIVE     Ketones, ur NEGATIVE  NEGATIVE (mg/dL)    Protein, ur NEGATIVE  NEGATIVE (mg/dL)    Urobilinogen, UA 1.0  0.0 - 1.0 (mg/dL)    Nitrite NEGATIVE  NEGATIVE     Leukocytes, UA NEGATIVE  NEGATIVE  MICROSCOPIC NOT DONE ON URINES WITH NEGATIVE PROTEIN, BLOOD, LEUKOCYTES, NITRITE, OR GLUCOSE <1000 mg/dL.  CBC     Status: Normal   Collection Time   06/17/11  6:12 PM      Component Value Range Comment   WBC 7.2  4.0 - 10.5 (K/uL)    RBC 4.75  3.87 - 5.11 (MIL/uL)    Hemoglobin 14.4  12.0 - 15.0 (g/dL)    HCT 42.6  36.0 - 46.0 (%)    MCV 89.7  78.0 - 100.0 (fL)    MCH 30.3  26.0 - 34.0 (pg)    MCHC 33.8  30.0 - 36.0 (g/dL)    RDW 13.5  11.5 - 15.5 (%)    Platelets 161  150 - 400 (K/uL)   DIFFERENTIAL     Status: Normal   Collection Time   06/17/11  6:12 PM      Component Value Range Comment   Neutrophils Relative 52  43 - 77 (%)    Neutro Abs 3.7  1.7 - 7.7 (K/uL)    Lymphocytes Relative 40  12 - 46 (%)    Lymphs Abs 2.9  0.7 - 4.0 (K/uL)    Monocytes Relative 7  3 - 12 (%)    Monocytes Absolute 0.5  0.1 - 1.0 (K/uL)    Eosinophils Relative 0  0 - 5 (%)    Eosinophils Absolute 0.0  0.0 - 0.7 (K/uL)    Basophils Relative 1  0 - 1 (%)    Basophils Absolute 0.0  0.0 - 0.1 (K/uL)   COMPREHENSIVE METABOLIC PANEL  Status: Abnormal   Collection Time   06/17/11  6:12 PM      Component Value Range Comment   Sodium 140  135 - 145 (mEq/L)    Potassium 4.1  3.5 - 5.1  (mEq/L)    Chloride 108  96 - 112 (mEq/L)    CO2 23  19 - 32 (mEq/L)    Glucose, Bld 115 (*) 70 - 99 (mg/dL)    BUN 17  6 - 23 (mg/dL)    Creatinine, Ser 0.65  0.50 - 1.10 (mg/dL)    Calcium 9.5  8.4 - 10.5 (mg/dL)    Total Protein 7.2  6.0 - 8.3 (g/dL)    Albumin 3.7  3.5 - 5.2 (g/dL)    AST 25  0 - 37 (U/L)    ALT 20  0 - 35 (U/L)    Alkaline Phosphatase 81  39 - 117 (U/L)    Total Bilirubin 0.3  0.3 - 1.2 (mg/dL)    GFR calc non Af Amer >90  >90 (mL/min)    GFR calc Af Amer >90  >90 (mL/min)   PROTIME-INR     Status: Abnormal   Collection Time   06/17/11  6:12 PM      Component Value Range Comment   Prothrombin Time 20.7 (*) 11.6 - 15.2 (seconds)    INR 1.74 (*) 0.00 - 1.49     Ct Head Wo Contrast  06/17/2011  *RADIOLOGY REPORT*  Clinical Data: Expressive aphasia.  Memory loss.  CT HEAD WITHOUT CONTRAST  Technique:  Contiguous axial images were obtained from the base of the skull through the vertex without contrast.  Comparison: Head CT 03/25/2010.  Findings: Remote left basal ganglia and left parietal lobe infarction with encephalomalacia and ex vacuo dilatation of the left lateral ventricle.  Stable age advanced cerebral atrophy.  No extra-axial fluid collection. No CT findings for acute hemispheric infarction or intracranial hemorrhage.  No mass lesions.  The brainstem and cerebellum grossly normal and stable.  Stable vascular calcifications.  The bony structures are intact.  The paranasal sinuses and mastoid air cells are clear.  The globes are intact.  IMPRESSION:  1.  Remote infarctions with encephalomalacia and ex vacuo dilatation of the left lateral ventricle. 2.  No acute intracranial findings or mass lesions.  Original Report Authenticated By: P. Kalman Jewels, M.D.    Review of Systems  Constitutional: Negative.   HENT: Negative.   Eyes: Negative.   Respiratory: Negative.   Cardiovascular: Negative.   Gastrointestinal: Negative.   Genitourinary: Negative.     Musculoskeletal:       Swelling of the right lower extremities.  Skin: Negative.   Neurological:       Expressive aphasia.  Endo/Heme/Allergies: Negative.   Psychiatric/Behavioral: Negative.     Blood pressure 146/99, pulse 91, temperature 98.1 F (36.7 C), temperature source Oral, resp. rate 16, weight 83.915 kg (185 lb), SpO2 99.00%. Physical Exam  Constitutional: She is oriented to person, place, and time. She appears well-developed and well-nourished. No distress.  HENT:  Head: Normocephalic and atraumatic.  Right Ear: External ear normal.  Left Ear: External ear normal.  Nose: Nose normal.  Mouth/Throat: Oropharynx is clear and moist. No oropharyngeal exudate.  Eyes: Conjunctivae and EOM are normal. Pupils are equal, round, and reactive to light. Right eye exhibits no discharge. Left eye exhibits no discharge. No scleral icterus.  Neck: Normal range of motion. Neck supple.  Cardiovascular: Normal rate, regular rhythm and normal heart sounds.  Respiratory: Effort normal and breath sounds normal. No respiratory distress. She has no wheezes. She has no rales.  GI: Soft. Bowel sounds are normal. She exhibits no distension. There is no tenderness. There is no rebound.  Musculoskeletal: Normal range of motion. She exhibits edema. She exhibits no tenderness.  Neurological: She is alert and oriented to person, place, and time.       Moves upper and lower extremities. Has expressive aphasia. No facial asymmetry. Tongue is midline. Able to see in both eyes.  Skin: She is not diaphoretic.  Psychiatric: Her behavior is normal.     Assessment/Plan #1. Right lower extremity swelling with history of recurrent DVT with subtherapeutic INR in a patient with antiphospholipid antibody syndrome on Coumadin - this most likely is DVT. Both her lower extremities are swollen but the right one is more than the left. He does not show any acute ischemic changes. For now we will continue with full  dose Lovenox per pharmacy and Coumadin. Doppler of the lower extremity has been ordered. #2. Expressive aphasia for last 3 weeks with history of previous CVA - I did discuss with on-call neurologist Dr. Doy Mince. Dr. Doy Mince advised to get MRI of the brain. And further plans after MRI of the brain. At this time I have also asked the nurse to get a bedside swallow. #3. History of diabetes mellitus type 2, antiphospholipid antibody syndrome, renal transplant, gout, hypertension - continue present medications.  CODE STATUS - full code.  Rise Patience 06/17/2011, 9:19 PM

## 2011-06-17 NOTE — ED Provider Notes (Signed)
History     CSN: [id number]  Arrival date & time 06/17/11  1400   First MD Initiated Contact with Patient 06/17/11 1554      Chief Complaint  Patient presents with  . Leg Pain    (Consider location/radiation/quality/duration/timing/severity/associated sxs/prior treatment) HPI history from patient and daughter. Patient is a 51 year old female with history of end-stage renal disease(status post transplant), lupus, factor V Leiden deficiency who presents with right lower extremity pain. His pain started within the last week and has progressively worsened. There is no preceding trauma or twisting of the leg. She has had progressively worsening swelling of this right lower extremity. No change in sensation or new weakness. She does have baseline weakness of the right lower extremity secondary to prior stroke. She has had no overlying redness with skin pain. She's had no fever, nausea, vomiting, diarrhea. She has history of DVT and PE but denies chest pain or dyspnea. The pain in her legs is worse when she walks or when she squeezes her calf.  Overall severity moderate.  Patient also had some issues with memory and increasing dysarthria over the past several weeks. She had these symptoms when she had a CVA in 2011, however her speech had significantly improved until the last few weeks. No other stroke symptoms such as facial droop, extremity weakness or sensation change. Patient compliant with her meds.  Past Medical History  Diagnosis Date  . THYROID NODULE, LEFT 04/10/2009  . DIABETES MELLITUS, TYPE II 08/21/2007  . HYPERLIPIDEMIA 08/21/2007  . GOUT 08/21/2007  . HYPERTENSION 08/21/2007    Dr. Andree Elk, [place] 08/21/2007  . CVA 04/17/2010  . CEREBROVASCULAR ACCIDENT, ACUTE 04/15/2010  . GERD 08/21/2007  . RENAL INSUFFICIENCY 08/21/2007  . LUPUS 08/21/2007  . OSTEOPOROSIS 08/21/2007    Rheumatol at [facility]  . DVT, HX OF 08/21/2007  . CLOSTRIDIUM DIFFICILE COLITIS, HX OF  08/21/2007  . KIDNEY TRANSPLANTATION, HX OF 08/22/2007    s/p renal transplant-Dr. Andree Elk, [facility]  . Pulmonary embolism 07/16/2010  . Renal failure   . Current use of long term anticoagulation     Dr. Andree Elk, [facility]  . Depression     Dr. Andree Elk, [facility]    Past Surgical History  Procedure Date  . Cholecystectomy   . Tubal ligation     Family History  Problem Relation Age of Onset  . Cancer Neg Hx   . Heart attack Mother   . Heart disease Father     History  Substance Use Topics  . Smoking status: Never Smoker   . Smokeless tobacco: Not on file  . Alcohol Use: No    OB History    Grav Para Term Preterm Abortions TAB SAB Ect Mult Living                  Review of Systems  Constitutional: Negative for fever and chills.  HENT: Negative for facial swelling.   Eyes: Negative for visual disturbance.  Respiratory: Negative for cough, chest tightness, shortness of breath and wheezing.   Cardiovascular: Positive for leg swelling. Negative for chest pain.  Gastrointestinal: Negative for nausea, vomiting, abdominal pain and diarrhea.  Genitourinary: Negative for difficulty urinating.  Skin: Negative for rash.  Neurological: Negative for weakness and numbness.  Psychiatric/Behavioral: Negative for behavioral problems and confusion.  All other systems reviewed and are negative.    Allergies  Oxycodone-acetaminophen; Propoxyphene n-acetaminophen; Sulfonamide derivatives; Codeine; and Latex  Home Medications   No current outpatient prescriptions on  file.  BP 148/98  Pulse 96  Temp(Src) 98.6 F (37 C) (Oral)  Resp 18  Ht 5\' 4"  (1.626 m)  Wt 183 lb 10.3 oz (83.3 kg)  BMI 31.52 kg/m2  SpO2 95%  Physical Exam  Nursing note and vitals reviewed. Constitutional: She is oriented to person, place, and time. She appears well-developed and well-nourished. No distress.  HENT:  Head: Normocephalic.  Nose: Nose normal.  Eyes: EOM are normal. Pupils are equal, round, and  reactive to light.  Neck: Normal range of motion. Neck supple.  Cardiovascular: Normal rate, regular rhythm and intact distal pulses.   Pulmonary/Chest: Effort normal and breath sounds normal. No respiratory distress.  Abdominal: Soft. She exhibits no distension. There is no tenderness.  Musculoskeletal: Normal range of motion.       Right lower extremity with 3-4+ pitting edema without overlying cellulitis. She has significant tenderness to palpation on calf. No bony tenderness to palpation. No pain with range of motion of her ankle knee or hip. 2+ DP pulse. Full well-perfused. Left lower extremity with 2+ pitting edema that significant asymmetry when compared to right. 2+ DP pulse  Neurological: She is alert and oriented to person, place, and time.       Normal strength  Skin: Skin is warm and dry. No rash noted. She is not diaphoretic.  Psychiatric: She has a normal mood and affect. Her behavior is normal. Thought content normal.    ED Course  Procedures (including critical care time)    Labs Reviewed  COMPREHENSIVE METABOLIC PANEL - Abnormal; Notable for the following:    Glucose, Bld 115 (*)    All other components within normal limits  PROTIME-INR - Abnormal; Notable for the following:    Prothrombin Time 20.7 (*)    INR 1.74 (*)    All other components within normal limits  URINALYSIS, ROUTINE W REFLEX MICROSCOPIC - Abnormal; Notable for the following:    APPearance HAZY (*)    Bilirubin Urine SMALL (*)    All other components within normal limits  GLUCOSE, CAPILLARY - Abnormal; Notable for the following:    Glucose-Capillary 118 (*)    All other components within normal limits  GLUCOSE, CAPILLARY - Abnormal; Notable for the following:    Glucose-Capillary 103 (*)    All other components within normal limits  CBC  DIFFERENTIAL  URINE CULTURE  COMPREHENSIVE METABOLIC PANEL  CBC  TSH  POCT CBG MONITORING  HEMOGLOBIN A1C  POCT CBG MONITORING  POCT PREGNANCY, URINE    PROTIME-INR   Ct Head Wo Contrast  06/17/2011  *RADIOLOGY REPORT*  Clinical Data: Expressive aphasia.  Memory loss.  CT HEAD WITHOUT CONTRAST  Technique:  Contiguous axial images were obtained from the base of the skull through the vertex without contrast.  Comparison: Head CT 03/25/2010.  Findings: Remote left basal ganglia and left parietal lobe infarction with encephalomalacia and ex vacuo dilatation of the left lateral ventricle.  Stable age advanced cerebral atrophy.  No extra-axial fluid collection. No CT findings for acute hemispheric infarction or intracranial hemorrhage.  No mass lesions.  The brainstem and cerebellum grossly normal and stable.  Stable vascular calcifications.  The bony structures are intact.  The paranasal sinuses and mastoid air cells are clear.  The globes are intact.  IMPRESSION:  1.  Remote infarctions with encephalomalacia and ex vacuo dilatation of the left lateral ventricle. 2.  No acute intracranial findings or mass lesions.  Original Report Authenticated By: P. Kalman Jewels, M.D.  1. Right leg pain       MDM  Patient with history of prior DVT and PE who has numerous prothrombotic medical issues who has right lower extremity edema and pain. There is no preceding trauma. On clinical exam, she most likely has DVT. There is no overlying cellulitis. She has not had any chest pain, dyspnea or other symptoms that would suggest PE. She has normal oxygenation and vital signs. INR subtherapeutic today. Otherwise labs unremarkable. Attempt made to perform lower extremity duplex today, however due to time of day this is unobtainable. Lovenox given. Patient admitted for observation and lower extremity duplex in morning. Patient's significant comorbidities precluded her from observation protocol.        Fritz Pickerel 06/18/11 0040

## 2011-06-18 ENCOUNTER — Observation Stay (HOSPITAL_COMMUNITY): Payer: PRIVATE HEALTH INSURANCE

## 2011-06-18 DIAGNOSIS — M7989 Other specified soft tissue disorders: Secondary | ICD-10-CM

## 2011-06-18 LAB — COMPREHENSIVE METABOLIC PANEL
ALT: 14 U/L (ref 0–35)
AST: 17 U/L (ref 0–37)
Albumin: 3.2 g/dL — ABNORMAL LOW (ref 3.5–5.2)
Alkaline Phosphatase: 67 U/L (ref 39–117)
BUN: 13 mg/dL (ref 6–23)
CO2: 23 mEq/L (ref 19–32)
Calcium: 8.9 mg/dL (ref 8.4–10.5)
Chloride: 109 mEq/L (ref 96–112)
Creatinine, Ser: 0.58 mg/dL (ref 0.50–1.10)
GFR calc Af Amer: >90 mL/min (ref >90)
GFR calc non Af Amer: >90 mL/min (ref >90)
Glucose, Bld: 139 mg/dL — ABNORMAL HIGH (ref 70–99)
Potassium: 3.2 mEq/L — ABNORMAL LOW (ref 3.5–5.1)
Sodium: 140 mEq/L (ref 135–145)
Total Bilirubin: 0.3 mg/dL (ref 0.3–1.2)
Total Protein: 6 g/dL (ref 6.0–8.3)

## 2011-06-18 LAB — CBC
HCT: 37.4 % (ref 36.0–46.0)
Hemoglobin: 12.3 g/dL (ref 12.0–15.0)
MCH: 29.4 pg (ref 26.0–34.0)
MCHC: 32.9 g/dL (ref 30.0–36.0)
MCV: 89.3 fL (ref 78.0–100.0)
Platelets: 170 10*3/uL (ref 150–400)
RBC: 4.19 MIL/uL (ref 3.87–5.11)
RDW: 13.5 % (ref 11.5–15.5)
WBC: 8 10*3/uL (ref 4.0–10.5)

## 2011-06-18 LAB — TSH: TSH: 2.153 u[IU]/mL (ref 0.350–4.500)

## 2011-06-18 LAB — GLUCOSE, CAPILLARY
Glucose-Capillary: 103 mg/dL — ABNORMAL HIGH (ref 70–99)
Glucose-Capillary: 105 mg/dL — ABNORMAL HIGH (ref 70–99)
Glucose-Capillary: 140 mg/dL — ABNORMAL HIGH (ref 70–99)
Glucose-Capillary: 150 mg/dL — ABNORMAL HIGH (ref 70–99)
Glucose-Capillary: 86 mg/dL (ref 70–99)
Glucose-Capillary: 88 mg/dL (ref 70–99)

## 2011-06-18 LAB — HEMOGLOBIN A1C
Hgb A1c MFr Bld: 5.9 % — ABNORMAL HIGH (ref <5.7)
Mean Plasma Glucose: 123 mg/dL — ABNORMAL HIGH (ref <117)

## 2011-06-18 LAB — PROTIME-INR
INR: 2.05 — ABNORMAL HIGH (ref 0.00–1.49)
Prothrombin Time: 23.5 seconds — ABNORMAL HIGH (ref 11.6–15.2)

## 2011-06-18 MED ORDER — WARFARIN SODIUM 7.5 MG PO TABS
7.5000 mg | ORAL_TABLET | Freq: Once | ORAL | Status: AC
  Administered 2011-06-18: 7.5 mg via ORAL
  Filled 2011-06-18: qty 1

## 2011-06-18 MED ORDER — INSULIN ASPART 100 UNIT/ML ~~LOC~~ SOLN
0.0000 [IU] | SUBCUTANEOUS | Status: DC
  Filled 2011-06-18: qty 3

## 2011-06-18 MED ORDER — PREDNISONE 1 MG PO TABS
3.0000 mg | ORAL_TABLET | Freq: Every day | ORAL | Status: DC
  Administered 2011-06-18 – 2011-06-19 (×2): 3 mg via ORAL
  Filled 2011-06-18 (×3): qty 3

## 2011-06-18 MED ORDER — POTASSIUM CHLORIDE CRYS ER 20 MEQ PO TBCR
40.0000 meq | EXTENDED_RELEASE_TABLET | ORAL | Status: AC
  Administered 2011-06-18 (×3): 40 meq via ORAL
  Filled 2011-06-18 (×3): qty 2

## 2011-06-18 MED ORDER — ENOXAPARIN SODIUM 100 MG/ML ~~LOC~~ SOLN
85.0000 mg | Freq: Two times a day (BID) | SUBCUTANEOUS | Status: DC
  Administered 2011-06-18 – 2011-06-19 (×2): 85 mg via SUBCUTANEOUS
  Filled 2011-06-18 (×4): qty 1

## 2011-06-18 MED ORDER — DEXTROSE-NACL 5-0.9 % IV SOLN
INTRAVENOUS | Status: DC
  Administered 2011-06-18 – 2011-06-19 (×3): via INTRAVENOUS

## 2011-06-18 NOTE — Progress Notes (Signed)
Pt arrived to the floor via stretcher, accompanied by Nursing staff. Admission hx and assessment completed. Pt had no complaints of pain or shortness of breath. Will continue to assess. Bed in lowest position, wheels locked, and call bell within reach. Pt is NPO because of failed swallow evaluation.

## 2011-06-18 NOTE — Progress Notes (Addendum)
ANTICOAGULATION CONSULT NOTE - Follow Up Consult  Pharmacy Consult for coumadin Indication: hx antiphospholipid Ab syndrome/recurrent CVA/DVT/PE and possible new RLE DVT  Allergies  Allergen Reactions  . Oxycodone-Acetaminophen Shortness Of Breath and Nausea Only  . Propoxyphene N-Acetaminophen Shortness Of Breath and Nausea Only  . Sulfonamide Derivatives Shortness Of Breath and Nausea Only  . Codeine     REACTION: nausea  . Latex     Patient Measurements: Height: 5\' 4"  (162.6 cm) Weight: 183 lb 10.3 oz (83.3 kg) IBW/kg (Calculated) : 54.7  Adjusted Body Weight:   Vital Signs: Temp: 98.4 F (36.9 C) (01/11 0812) Temp src: Oral (01/11 0812) BP: 90/59 mmHg (01/11 0812) Pulse Rate: 96  (01/11 0812)  Labs:  Basename 06/18/11 0550 06/17/11 1812  HGB 12.3 14.4  HCT 37.4 42.6  PLT 170 161  APTT -- --  LABPROT 23.5* 20.7*  INR 2.05* 1.74*  HEPARINUNFRC -- --  CREATININE 0.58 0.65  CKTOTAL -- --  CKMB -- --  TROPONINI -- --   Estimated Creatinine Clearance: 87.8 ml/min (by C-G formula based on Cr of 0.58).   Medications:  Scheduled:    . calcitRIOL  0.25 mcg Oral Daily  . diltiazem  180 mg Oral Daily  . enoxaparin  85 mg Subcutaneous Once  . enoxaparin (LOVENOX) injection  85 mg Subcutaneous Q12H  . folic acid  1 mg Oral Daily  . glimepiride  1 mg Oral QAC breakfast  . insulin aspart  0-9 Units Subcutaneous Q4H  . mycophenolate  250 mg Oral Daily  . pantoprazole  40 mg Oral Daily  . potassium chloride  40 mEq Oral Q4H  . predniSONE  3 mg Oral Q breakfast  . sertraline  100 mg Oral Daily  . sodium chloride  3 mL Intravenous Q12H  . tacrolimus  0.5 mg Oral BID  . tacrolimus  1 mg Oral BID  . warfarin  7.5 mg Oral Once  . DISCONTD: enoxaparin (LOVENOX) injection  85 mg Subcutaneous Q12H  . DISCONTD: insulin aspart  0-9 Units Subcutaneous TID WC  . DISCONTD: predniSONE  1 mg Oral TID PC   Infusions:    . dextrose 5 % and 0.9% NaCl 50 mL/hr at 06/18/11 0448      Assessment: 86 you female with hx antiphospholipid Ab syndrome/recurrent CVA/DVT/PE and possible new RLE DVT will be restarted on coumadin after passing swallow eval today. INR today 2.05 (did not get dose yesterday due to not passing swallow eval during that time). PTA dose of coumadin is 5mg  po qday.  Goal of Therapy:  INR 2-3   Plan:  1) Coumadin 7.5mg  po x1. 2) INR in am.  Elizet Kaplan, [place]in 06/18/2011,2:35 PM

## 2011-06-18 NOTE — Progress Notes (Signed)
Speech Language/Pathology Clinical/Bedside Swallow Evaluation Patient Details  Name: Connie Ruiz MRN: [id number] DOB: 12-11-1960 Today's Date: 06/18/2011  Past Medical History:  Past Medical History  Diagnosis Date  . THYROID NODULE, LEFT 04/10/2009  . DIABETES MELLITUS, TYPE II 08/21/2007  . HYPERLIPIDEMIA 08/21/2007  . GOUT 08/21/2007  . HYPERTENSION 08/21/2007    Dr. Andree Elk, [place] 08/21/2007  . CVA 04/17/2010  . CEREBROVASCULAR ACCIDENT, ACUTE 04/15/2010  . GERD 08/21/2007  . RENAL INSUFFICIENCY 08/21/2007  . LUPUS 08/21/2007  . OSTEOPOROSIS 08/21/2007    Rheumatol at [facility]  . DVT, HX OF 08/21/2007  . CLOSTRIDIUM DIFFICILE COLITIS, HX OF 08/21/2007  . KIDNEY TRANSPLANTATION, HX OF 08/22/2007    s/p renal transplant-Dr. Andree Elk, [facility]  . Pulmonary embolism 07/16/2010  . Renal failure   . Current use of long term anticoagulation     Dr. Andree Elk, [facility]  . Depression     Dr. Andree Elk, [facility]   Past Surgical History:  Past Surgical History  Procedure Date  . Cholecystectomy   . Tubal ligation    HPI:  Pt is a 51 year old female admitted with right lower extremity swelling due to recurrent DVT and increased expressive aphasia for the past 3 weeks.  She has a history of multiple previous CVA's with no difficulty swallowing in the past. The current head CT is negative, MRI is pending. The pts CXR is clear. She reports intermittent periods of difficulty swallowing, states she sometimes gets choked.    Assessment/Recommendations/Treatment Plan    SLP Assessment Clinical Impression Statement: Pt currently presents with normal oral and oropharyngeal function with no subjective s/s of aspiration observed. The pt admits however, that her difficulty is intermittent. At this time the pt may continue a regular thin liquid diet utilizing basic aspiraiton precuations to increase safety if moments of increased weakness occur. Feel pt is at minimal aspiraiton risk at this  time. SLP will f/u for education and tolerance.  Risk for Aspiration: Mild Other Related Risk Factors: Previous CVA  Swallow Recommendations Solid Consistency: Regular Liquid Consistency: Thin Liquid Administration via: Cup Medication Administration: Whole meds with puree Compensations: Slow rate;Small sips/bites Postural Changes and/or Swallow Maneuvers: Seated upright 90 degrees;Upright 30-60 min after meal Oral Care Recommendations: Oral care BID;Patient independent with oral care Follow up Recommendations: Outpatient SLP  Treatment Plan Treatment Plan Recommendations: Therapy as outlined in treatment plan below Speech Therapy Frequency: min 2x/week Interventions: Aspiration precaution training;Patient/family education;Diet toleration management by SLP  Prognosis Prognosis for Safe Diet Advancement: Good  Individuals Consulted Consulted and Agree with Results and Recommendations: Patient  Swallowing Goals  SLP Swallowing Goals Patient will consume recommended diet without observed clinical signs of aspiration with: Independent assistance Swallow Study Goal #1 - Progress: Progressing toward goal Patient will utilize recommended strategies during swallow to increase swallowing safety with: Minimal cueing Swallow Study Goal #2 - Progress: Progressing toward goal [facility], [place] CCC-SLP [id number]  Damoni Causby, Katherene Ponto 06/18/2011,11:38 AM

## 2011-06-18 NOTE — Progress Notes (Signed)
Speech Language/Pathology Speech Language Pathology Evaluation Patient Details Name: Connie Ruiz MRN: [id number] DOB: 06/07/1961 Today's Date: 06/18/2011  Problem List:  Patient Active Problem List  Diagnoses  . THYROID NODULE, LEFT  . DIABETES MELLITUS, TYPE II  . HYPERLIPIDEMIA  . GOUT  . HYPERTENSION  . CONGESTIVE HEART FAILURE  . CVA  . CEREBROVASCULAR ACCIDENT, ACUTE  . ACUTE BRONCHITIS  . GERD  . RENAL INSUFFICIENCY  . LUPUS  . OSTEOPOROSIS  . COUGH  . DVT, HX OF  . CLOSTRIDIUM DIFFICILE COLITIS, HX OF  . KIDNEY TRANSPLANTATION, HX OF  . Pulmonary embolism  . Anticoagulated  . Routine general medical examination at a health care facility  . Arm pain, left  . Dysphasia  . Incontinence of urine  . Hand pain, left  . DVT (deep venous thrombosis)  . Expressive aphasia   Past Medical History:  Past Medical History  Diagnosis Date  . THYROID NODULE, LEFT 04/10/2009  . DIABETES MELLITUS, TYPE II 08/21/2007  . HYPERLIPIDEMIA 08/21/2007  . GOUT 08/21/2007  . HYPERTENSION 08/21/2007    Dr. Andree Elk, [place] 08/21/2007  . CVA 04/17/2010  . CEREBROVASCULAR ACCIDENT, ACUTE 04/15/2010  . GERD 08/21/2007  . RENAL INSUFFICIENCY 08/21/2007  . LUPUS 08/21/2007  . OSTEOPOROSIS 08/21/2007    Rheumatol at [facility]  . DVT, HX OF 08/21/2007  . CLOSTRIDIUM DIFFICILE COLITIS, HX OF 08/21/2007  . KIDNEY TRANSPLANTATION, HX OF 08/22/2007    s/p renal transplant-Dr. Andree Elk, [facility]  . Pulmonary embolism 07/16/2010  . Renal failure   . Current use of long term anticoagulation     Dr. Andree Elk, [facility]  . Depression     Dr. Andree Elk, [facility]   Past Surgical History:  Past Surgical History  Procedure Date  . Cholecystectomy   . Tubal ligation    Cognitive/Linguistic Baseline: Baseline deficits Baseline deficit details: Pt has had multiple CVAs in past, reports is currently being treated and [facility] on [address]. Pt could not give details regarding  current goals but states that her difficulty has gotten worse over the past 3 weeks. An MRI is pending. last MRI from 03/15/10 shows moderate sized acute non hemorrhagic infarct left basal ganglia and corona radiata and remote large left periopercular/frontal lobe/parietal lobe infarct and bilateral cerebellar infarcts.   SLP Assessment/Plan/Recommendation Assessment Clinical Impression Statement: Pt presents with mild to moderate expressive deficits with anomia especially at phrase level. Mild receptive deficits. Pt with baseline deficits but pt reports increased difficutly over the last three weeks. Pt would benefit from continued outpatient therapy to address old and new deficits. WIll defer acute therapy at this time.  SLP Recommendation/Assessment: All further Speech Lanaguage Pathology  needs can be addressed in the next venue of care SLP Recommendations Follow up Recommendations: Outpatient SLP Individuals Consulted Consulted and Agree with Results and Recommendations: Patient  Herbie Baltimore, [place] CCC-SLP [id number]  Connie Ruiz 06/18/2011, 12:40 PM

## 2011-06-18 NOTE — Progress Notes (Addendum)
ANTICOAGULATION CONSULT NOTE - Follow Up Consult  Pharmacy Consult for Warfarin/Lovenox Indication: Hx antiphospholipid syndrome/CVA/DVT/PE and possible new RLE DVT  Assessment: 51 y.o. F on lovenox/warfarin for hx antiphospholipid syndrome/CVA/PE/DVT and possible new RLE DVT. The patient came in with SUBtherapeutic INR however INR at goal this morning despite no warfarin given last night as patient failed swallow evaluation. (INR 2.05 << 1.74). Discussed with MD and prefers to hold warfarin po dose until swallow re-evaluated and continue with Lovenox. The patient has a hx renal transplant however renal function appears stable at this time with CrCl>30 ml/min so will continue with q12h dosing. Hgb/Hct/Plt slight drop, no s/sx of bleeding noted.  Goal of Therapy:  INR 2-3, anti-Xa level 0.6-1.2   Plan: 1. Will hold warfarin dose for now until swallow re-evaluated (will check back this evening) 2. Continue Lovenox 85 mg SQ every 12 hours 3. Will continue to monitor for renal function and any signs/symptoms of bleeding--will follow up with  PT/INR in the a.m.   Alycia Rossetti, PharmD, BCPS 06/18/2011 10:40 AM     Allergies  Allergen Reactions  . Oxycodone-Acetaminophen Shortness Of Breath and Nausea Only  . Propoxyphene N-Acetaminophen Shortness Of Breath and Nausea Only  . Sulfonamide Derivatives Shortness Of Breath and Nausea Only  . Codeine     REACTION: nausea  . Latex     Patient Measurements: Height: 5\' 4"  (162.6 cm) Weight: 183 lb 10.3 oz (83.3 kg) IBW/kg (Calculated) : 54.7   Vital Signs: Temp: 98 F (36.7 C) (01/11 0500) Temp src: Oral (01/11 0500) BP: 148/84 mmHg (01/11 0500) Pulse Rate: 88  (01/11 0500)  Labs:  Basename 06/18/11 0550 06/17/11 1812  HGB 12.3 14.4  HCT 37.4 42.6  PLT 170 161  APTT -- --  LABPROT 23.5* 20.7*  INR 2.05* 1.74*  HEPARINUNFRC -- --  CREATININE 0.58 0.65  CKTOTAL -- --  CKMB -- --  TROPONINI -- --   Estimated Creatinine  Clearance: 87.8 ml/min (by C-G formula based on Cr of 0.58).   Medications:  Prescriptions prior to admission  Medication Sig Dispense Refill  . calcitRIOL (ROCALTROL) 0.25 MCG capsule Take 1 capsule (0.25 mcg total) by mouth daily.  30 capsule  11  . diltiazem (DILACOR XR) 180 MG 24 hr capsule Take 180 mg by mouth daily.        Marland Kitchen esomeprazole (NEXIUM) 40 MG capsule Take 40 mg by mouth daily.        . folic acid (FOLVITE) 1 MG tablet Take 1 mg by mouth daily.        Marland Kitchen glimepiride (AMARYL) 1 MG tablet Take 1 tablet (1 mg total) by mouth daily before breakfast.  30 tablet  11  . guaiFENesin (MUCINEX) 600 MG 12 hr tablet Take 1,200 mg by mouth 2 (two) times daily as needed. For cough symptoms      . mycophenolate (CELLCEPT) 250 MG capsule Take 250 mg by mouth daily.        . predniSONE (DELTASONE) 1 MG tablet Take 3 mg by mouth daily.      . sertraline (ZOLOFT) 100 MG tablet Take 100 mg by mouth daily.        . tacrolimus (PROGRAF) 0.5 MG capsule Take 0.5 mg by mouth 2 (two) times daily.      . tacrolimus (PROGRAF) 1 MG capsule Take 1 mg by mouth 2 (two) times daily.        Marland Kitchen warfarin (COUMADIN) 1 MG tablet Take 5 mg by  mouth daily.      Marland Kitchen ZINC SULFATE PO Take 1 tablet by mouth daily. OTC.      Marland Kitchen glucose blood (PRODIGY NO CODING BLOOD GLUC) test strip Use as instructed once daily dx 250.00       Scheduled:    . calcitRIOL  0.25 mcg Oral Daily  . diltiazem  180 mg Oral Daily  . enoxaparin  85 mg Subcutaneous Once  . enoxaparin (LOVENOX) injection  85 mg Subcutaneous Q12H  . folic acid  1 mg Oral Daily  . glimepiride  1 mg Oral QAC breakfast  . insulin aspart  0-9 Units Subcutaneous Q4H  . mycophenolate  250 mg Oral Daily  . pantoprazole  40 mg Oral Daily  . predniSONE  3 mg Oral Q breakfast  . sertraline  100 mg Oral Daily  . sodium chloride  3 mL Intravenous Q12H  . tacrolimus  0.5 mg Oral BID  . tacrolimus  1 mg Oral BID  . warfarin  7.5 mg Oral Once  . DISCONTD: enoxaparin  (LOVENOX) injection  85 mg Subcutaneous Q12H  . DISCONTD: insulin aspart  0-9 Units Subcutaneous TID WC  . DISCONTD: predniSONE  1 mg Oral TID PC

## 2011-06-18 NOTE — Progress Notes (Signed)
Utilization review complete 

## 2011-06-18 NOTE — Progress Notes (Signed)
VASCULAR LAB PRELIMINARY  PRELIMINARY  PRELIMINARY  PRELIMINARY  Bilateral lower extremity venous duplex  completed.    Preliminary report:  Bilateral:  No obvious evidence of DVT, superficial thrombosis, or Baker's Cyst.  Technically limited by poor images.    Judithann Sauger, RVT 06/18/2011, 2:29 PM

## 2011-06-18 NOTE — Progress Notes (Signed)
Subjective:  Still with expressive aphasia and some R. Sided weakness. Denies any Ruiz/o. Family at bedside. Objective: Vital signs in last 24 hours: Temp:  [97.5 F (36.4 Ruiz)-98.7 F (37.1 Ruiz)] 98 F (36.7 Ruiz) (01/11 0500) Pulse Rate:  [88-101] 88  (01/11 0500) Resp:  [16-18] 18  (01/11 0500) BP: (132-148)/(76-99) 148/84 mmHg (01/11 0500) SpO2:  [94 %-100 %] 100 % (01/11 0500) Weight:  [83.3 kg (183 lb 10.3 oz)-83.915 kg (185 lb)] 83.3 kg (183 lb 10.3 oz) (01/10 2253) Last BM Date: 06/17/11 Intake/Output from previous day: 01/10 0701 - 01/11 0700 In: 85.5 [I.V.:85.5] Out: -  Intake/Output this shift:      General Appearance:    Alert, cooperative, no distress, obese  Lungs:     Clear to auscultation bilaterally, respirations unlabored   Heart:    Regular rate and rhythm, S1 and S2 normal, no murmur, rub   or gallop  Abdomen:     Soft, non-tender, bowel sounds active all four quadrants,    no masses  Extremities:    no cyanosis or edema  Neurologic:   CNII-XII intact, RUE strength4/5, RLE2/5, and L. Side strength 4/5    Weight change:   Intake/Output Summary (Last 24 hours) at 06/18/11 1136 Last data filed at 06/18/11 [id number]  Gross per 24 hour  Intake   85.5 ml  Output      0 ml  Net   85.5 ml    Lab Results:   [facility] 06/18/11 0550 06/17/11 1812  NA 140 140  K 3.2* 4.1  CL 109 108  CO2 23 23  GLUCOSE 139* 115*  BUN 13 17  CREATININE 0.58 0.65  CALCIUM 8.9 9.5    Basename 06/18/11 0550 06/17/11 1812  WBC 8.0 7.2  HGB 12.3 14.4  HCT 37.4 42.6  PLT 170 161  MCV 89.3 89.7   PT/INR  Basename 06/18/11 0550 06/17/11 1812  LABPROT 23.5* 20.7*  INR 2.05* 1.74*   ABG No results found for this basename: PHART:2,PCO2:2,PO2:2,HCO3:2 in the last 72 hours  Micro Results: No results found for this or any previous visit (from the past 240 hour(s)). Studies/Results: Ct Head Wo Contrast  06/17/2011  *RADIOLOGY REPORT*  Clinical Data: Expressive aphasia.  Memory  loss.  CT HEAD WITHOUT CONTRAST  Technique:  Contiguous axial images were obtained from the base of the skull through the vertex without contrast.  Comparison: Head CT 03/25/2010.  Findings: Remote left basal ganglia and left parietal lobe infarction with encephalomalacia and ex vacuo dilatation of the left lateral ventricle.  Stable age advanced cerebral atrophy.  No extra-axial fluid collection. No CT findings for acute hemispheric infarction or intracranial hemorrhage.  No mass lesions.  The brainstem and cerebellum grossly normal and stable.  Stable vascular calcifications.  The bony structures are intact.  The paranasal sinuses and mastoid air cells are clear.  The globes are intact.  IMPRESSION:  1.  Remote infarctions with encephalomalacia and ex vacuo dilatation of the left lateral ventricle. 2.  No acute intracranial findings or mass lesions.  Original Report Authenticated By: P. Kalman Jewels, M.D.   Medications:  Scheduled Meds:   . calcitRIOL  0.25 mcg Oral Daily  . diltiazem  180 mg Oral Daily  . enoxaparin  85 mg Subcutaneous Once  . enoxaparin (LOVENOX) injection  85 mg Subcutaneous Q12H  . folic acid  1 mg Oral Daily  . glimepiride  1 mg Oral QAC breakfast  . insulin aspart  0-9 Units Subcutaneous  Q4H  . mycophenolate  250 mg Oral Daily  . pantoprazole  40 mg Oral Daily  . potassium chloride  40 mEq Oral Q4H  . predniSONE  3 mg Oral Q breakfast  . sertraline  100 mg Oral Daily  . sodium chloride  3 mL Intravenous Q12H  . tacrolimus  0.5 mg Oral BID  . tacrolimus  1 mg Oral BID  . warfarin  7.5 mg Oral Once  . DISCONTD: enoxaparin (LOVENOX) injection  85 mg Subcutaneous Q12H  . DISCONTD: insulin aspart  0-9 Units Subcutaneous TID WC  . DISCONTD: predniSONE  1 mg Oral TID PC   Continuous Infusions:   . dextrose 5 % and 0.9% NaCl 50 mL/hr at 06/18/11 0448   PRN Meds:.acetaminophen, acetaminophen, guaiFENesin, ondansetron (ZOFRAN) IV, ondansetron Assessment/Plan: Patient  Active Hospital Problem List: Probable DVT (deep venous thrombosis) (06/17/2011) -contnue coumadin lovenox for overlap as INR therapeutic -awaiting doppler US  DIABETES MELLITUS, TYPE II (08/21/2007) -continue amaryl, SSI.   HYPERTENSION (08/21/2007) -continue diltizem   CVA /Expressive aphasia/R. Sided weakness -await MRI, PT/OT/ST. Pt on coumadin as above and INR contaminant.   (04/17/2010)   LUPUS (08/21/2007)   KIDNEY TRANSPLANTATION, HX OF (08/22/2007) (06/17/2011) -coninue outpt meds. Hypokalemia -replace k     LOS: 1 day   Connie Ruiz 06/18/2011, 11:36 AM

## 2011-06-19 LAB — BASIC METABOLIC PANEL
BUN: 10 mg/dL (ref 6–23)
CO2: 20 mEq/L (ref 19–32)
Calcium: 7.7 mg/dL — ABNORMAL LOW (ref 8.4–10.5)
Chloride: 116 mEq/L — ABNORMAL HIGH (ref 96–112)
Creatinine, Ser: 0.52 mg/dL (ref 0.50–1.10)
GFR calc Af Amer: >90 mL/min (ref >90)
GFR calc non Af Amer: >90 mL/min (ref >90)
Glucose, Bld: 547 mg/dL — ABNORMAL HIGH (ref 70–99)
Potassium: 3.5 mEq/L (ref 3.5–5.1)
Sodium: 142 mEq/L (ref 135–145)

## 2011-06-19 LAB — PROTIME-INR
INR: 2.27 — ABNORMAL HIGH (ref 0.00–1.49)
Prothrombin Time: 25.4 seconds — ABNORMAL HIGH (ref 11.6–15.2)

## 2011-06-19 LAB — GLUCOSE, CAPILLARY
Glucose-Capillary: 86 mg/dL (ref 70–99)
Glucose-Capillary: 95 mg/dL (ref 70–99)

## 2011-06-19 MED ORDER — WARFARIN SODIUM 5 MG PO TABS
5.0000 mg | ORAL_TABLET | Freq: Once | ORAL | Status: DC
  Filled 2011-06-19: qty 1

## 2011-06-19 NOTE — Progress Notes (Signed)
ANTICOAGULATION CONSULT NOTE - Follow Up Consult  Pharmacy Consult for coumadin & lovenox Indication: hx antiphospholipid Ab syndrome/recurrent CVA/DVT/PE and possible new RLE DVT  Assessment: 51 yo female with hx/o antiphospholipid Ab syndrome/recurrent CVA/DVT/PE and possible new RLE DVT who restarted on coumadin after passing swallow eval yesterday. INR therapeutic today = 2.27 (PTA dose of coumadin is 5mg  daily). D#3/5 lovenox/coumadin overlap. Doppler results show no evidence of DVT. No bleeding issues reported.  Pharmacist System-Based Medication Review: Cardiovascular: Hx HTN/DL/CHF- hypertensive, on home diltiazem CD. Endocrinology: Hx DM. A1c~6 (01/08/11). CBGs controlled. On home glimepiride, SSI Gastrointestinal / Nutrition: passed swallow eval 1/11 Neurology: Hx/o CVA and expressive aphasia noted x 3 weeks being evaluated by PCP. Head CT(1/10): remote infarctions with encephalomalacia and ex vacuo dilation of the L lateral ventricle. Per discussion with neuro--to get MRI. Nephrology: Hx/o renal transplant (unkown date). SCr 0.52, CrCl~88 ml/min. K 3.5. Transplant meds verified with patient as best as possible given aphasia and updated on med rec. Pt takes tacrolimus 1.5 mg bid, cellcept 250 mg daily, prednisone 3 mg daily. All of these are verified with recent IM note except for increase of tacrolimus from 1 mg bid to 1.5 mg bid.  Hematology / Oncology: Hx/o lupus/antiphospholipid Ab syndrome. Hgb~12.3, plts~170. On no lupus meds PTA. PTA Medication Issues: PPIs can decrease serum concentrations of Cellcept.  Best Practices: VTE prophylaxis, home meds addressed  Goal of Therapy:  INR 2-3   Plan:  1) Coumadin 5mg  po x1 today. Check INR in AM. 2) Recommend d/c Lovenox since no evidence of DVT & INR therapeutic  Meriam Sprague. Marianna Fuss, PharmD  06/19/2011 8:52 AM   Allergies  Allergen Reactions  . Oxycodone-Acetaminophen Shortness Of Breath and Nausea Only  . Propoxyphene  N-Acetaminophen Shortness Of Breath and Nausea Only  . Sulfonamide Derivatives Shortness Of Breath and Nausea Only  . Codeine     REACTION: nausea  . Latex     Patient Measurements: Height: 5\' 4"  (162.6 cm) Weight: 184 lb 1.4 oz (83.5 kg) IBW/kg (Calculated) : 54.7  Adjusted Body Weight:   Vital Signs: Temp: 97.8 F (36.6 C) (01/12 0633) Temp src: Oral (01/12 0633) BP: 146/91 mmHg (01/12 0633) Pulse Rate: 80  (01/12 0633)  Labs:  Basename 06/19/11 0500 06/18/11 0550 06/17/11 1812  HGB -- 12.3 14.4  HCT -- 37.4 42.6  PLT -- 170 161  APTT -- -- --  LABPROT 25.4* 23.5* 20.7*  INR 2.27* 2.05* 1.74*  HEPARINUNFRC -- -- --  CREATININE 0.52 0.58 0.65  CKTOTAL -- -- --  CKMB -- -- --  TROPONINI -- -- --   Estimated Creatinine Clearance: 87.9 ml/min (by C-G formula based on Cr of 0.52).   Medications:  Scheduled:     . calcitRIOL  0.25 mcg Oral Daily  . diltiazem  180 mg Oral Daily  . enoxaparin (LOVENOX) injection  85 mg Subcutaneous Q12H  . folic acid  1 mg Oral Daily  . glimepiride  1 mg Oral QAC breakfast  . insulin aspart  0-9 Units Subcutaneous Q4H  . mycophenolate  250 mg Oral Daily  . pantoprazole  40 mg Oral Daily  . potassium chloride  40 mEq Oral Q4H  . predniSONE  3 mg Oral Q breakfast  . sertraline  100 mg Oral Daily  . sodium chloride  3 mL Intravenous Q12H  . tacrolimus  0.5 mg Oral BID  . tacrolimus  1 mg Oral BID  . warfarin  7.5 mg Oral ONCE-1800  .  DISCONTD: enoxaparin (LOVENOX) injection  85 mg Subcutaneous Q12H  . DISCONTD: predniSONE  1 mg Oral TID PC  . DISCONTD: warfarin  7.5 mg Oral Once   Infusions:     . dextrose 5 % and 0.9% NaCl 50 mL/hr at 06/19/11 0217

## 2011-06-19 NOTE — Discharge Summary (Signed)
Name: Connie Ruiz MRN: [id number] DOB: [birth year] 51 y.o.  Date of Admission: 06/17/2011  2:01 PM Date of Discharge: 06/19/2011 Attending Physician: Sheila Oats  Discharge Diagnosis: Principal Problem:  *Right lower extremity swelling - Doppler ultrasound negative for DVT Active Problems: Expressive aphasia -multiple chronic hemorrhagic infarct noted per MRI, negative for acute findings. History of antiphospholipid antibody syndrome DIABETES MELLITUS, TYPE II HYPERTENSION  HISTORY OF CVA  LUPUS  KIDNEY TRANSPLANTATION, HX OF    Discharge Medications: Current Discharge Medication List    CONTINUE these medications which have NOT CHANGED   Details  calcitRIOL (ROCALTROL) 0.25 MCG capsule Take 1 capsule (0.25 mcg total) by mouth daily. Qty: 30 capsule, Refills: 11    diltiazem (DILACOR XR) 180 MG 24 hr capsule Take 180 mg by mouth daily.      esomeprazole (NEXIUM) 40 MG capsule Take 40 mg by mouth daily.      folic acid (FOLVITE) 1 MG tablet Take 1 mg by mouth daily.      glimepiride (AMARYL) 1 MG tablet Take 1 tablet (1 mg total) by mouth daily before breakfast. Qty: 30 tablet, Refills: 11    guaiFENesin (MUCINEX) 600 MG 12 hr tablet Take 1,200 mg by mouth 2 (two) times daily as needed. For cough symptoms    mycophenolate (CELLCEPT) 250 MG capsule Take 250 mg by mouth daily.      predniSONE (DELTASONE) 1 MG tablet Take 3 mg by mouth daily.    sertraline (ZOLOFT) 100 MG tablet Take 100 mg by mouth daily.      !! tacrolimus (PROGRAF) 0.5 MG capsule Take 0.5 mg by mouth 2 (two) times daily.    !! tacrolimus (PROGRAF) 1 MG capsule Take 1 mg by mouth 2 (two) times daily.      warfarin (COUMADIN) 1 MG tablet Take 5 mg by mouth daily.    ZINC SULFATE PO Take 1 tablet by mouth daily. OTC.    glucose blood (PRODIGY NO CODING BLOOD GLUC) test strip Use as instructed once daily dx 250.00     !! - Potential duplicate medications found. Please discuss with provider.      Disposition and follow-up:   Connie Ruiz was discharged from [facility] in improved/stable condition.    Follow-up Appointments: Discharge Orders    Future Appointments: Provider: Department: Dept Phone: Center:   06/21/2011 2:15 PM Zenovia Jarred, RN [facility] [id number] None   06/22/2011 3:15 PM Garald Balding, [place] [phone] [facility]   06/25/2011 3:15 PM Garald Balding, [place] [phone] [facility]   06/29/2011 2:30 PM Garald Balding, CCC-SLP [facility] [phone] [facility]   07/01/2011 2:30 PM Garald Balding, CCC-SLP [facility] [id number] [facility]   07/06/2011 2:30 PM Garald Balding, CCC-SLP [facility] [id number] [facility]   07/08/2011 2:30 PM Garald Balding, CCC-SLP [facility] [phone] [facility]     Future Orders Please Complete By Expires   Diet Carb Modified      Increase activity slowly        -Outpatient speech pathologist/therapist Consultations:    Procedures Performed:  Ct Head Wo Contrast  06/17/2011  *RADIOLOGY REPORT*  Clinical Data: Expressive aphasia.  Memory loss.  CT HEAD WITHOUT CONTRAST  Technique:  Contiguous axial images were obtained from the base of the skull through the vertex without contrast.  Comparison: Head CT 03/25/2010.  Findings: Remote left basal ganglia and left parietal lobe infarction with encephalomalacia and ex vacuo dilatation of the left lateral ventricle.  Stable age advanced cerebral atrophy.  No extra-axial fluid collection. No CT findings for acute hemispheric infarction or intracranial hemorrhage.  No mass lesions.  The brainstem and cerebellum grossly normal and stable.  Stable vascular calcifications.  The bony structures are intact.  The paranasal sinuses and mastoid air cells are clear.  The globes are intact.  IMPRESSION:  1.  Remote infarctions with encephalomalacia and ex vacuo dilatation of the left lateral ventricle. 2.  No acute intracranial findings or mass lesions.  Original  Report Authenticated By: P. Kalman Jewels, M.D.   Mr Brain Wo Contrast  06/18/2011  *RADIOLOGY REPORT*  Clinical Data: Right lower extremity swelling.  Renal transplant  MRI HEAD WITHOUT CONTRAST  Technique:  Multiplanar, multiecho pulse sequences of the brain and surrounding structures were obtained according to standard protocol without intravenous contrast.  Comparison: CT 06/17/2011  Findings: Chronic hemorrhagic infarct in the left external capsule. Chronic infarct in the left insula.  There is enlargement of the left lateral ventricle due to volume loss.  Chronic small infarcts in the cerebellum bilaterally.  Negative for acute infarct.  Negative for mass lesion.  IMPRESSION: Chronic hemorrhagic infarct in the left external capsule.  Chronic infarct in the left insula and left parietal lobe.  Chronic ischemia in the cerebellum bilaterally.  Negative for acute abnormality.  Original Report Authenticated By: Truett Perna, M.D.   Lower extremity Doppler ultrasound Preliminary report: Bilateral: No obvious evidence of DVT, superficial thrombosis, or Baker's Cyst. Technically limited by poor images.   Brief history The patient is a 51 year-old female with history of antiphospholipid antibody syndrome, renal transplant on immunosuppressants, history of recurrent CVA, history of recurrent DVT and history of pulmonary embolism, diabetes mellitus type 2 present to the ER because increasing swelling of the right lower extremity over the last 4 days. Patient also in addition has been having expressive aphasia over the last 3 weeks and had gone to her PCP 2 weeks ago and PCP had ordered MRI brain as an outpatient which is still pending. The patient denies any focal deficits or difficulty swallowing or any visual symptoms. Patient did have a CT head today which did not show any acute. Patient denies any nausea vomiting abdominal pain or diarrhea. Denies any chest pain shortness of breath fever or chills.  Patient does have swelling in the right lower extremity which makes it difficult to walk. But denies any severe pain. Her INR is subtherapeutic and Lovenox was given for possible DVT and Doppler ultrasound scheduled for the following day. She was admitted for further evaluation and management.  Physical exam General Appearance:  Alert, cooperative, no distress, obese   Lungs:  Clear to auscultation bilaterally, respirations unlabored   Heart:  Regular rate and rhythm, S1 and S2 normal, no murmur, rub  or gallop   Abdomen:  Soft, non-tender, bowel sounds active all four quadrants,  no masses   Extremities:  no cyanosis or edema   Neurologic:  CNII-XII intact, RUE strength4/5, RLE2-3/5, and L. Side strength 4-5/5       Hospital Course by problem list:  Principal Problem:  *Right lower extremity swelling - Doppler ultrasound negative for DVT Active Problems: Antiphospholipid antibody syndrome Expressive aphasia -multiple chronic hemorrhagic infarct noted per MRI, negative for acute findings. DIABETES MELLITUS, TYPE II HYPERTENSION  HISTORY OF CVA  LUPUS  KIDNEY TRANSPLANTATION, HX OF   Right lower extremity edema (06/17/2011)  As discussed above upon admission the patient was initially started on empiric Lovenox  as her INR was subtherapeutic,and maintained on Coumadin. The Doppler ultrasound was was done on 1/11 and it came back negative for DVT.the patient does have a history of a antiphospholipid antibody syndrome and a social was maintained on her Coumadin and her INR is therapeutic -2.27 and today.  DIABETES MELLITUS, TYPE II (08/21/2007)  She was maintained on her outpatient medications during this hospital stay are, Accu-Cheks were monitored and she was also covered with sliding scale insulin . HYPERTENSION (08/21/2007)  She was maintained on her outpatient medications and she is to continue them upon discharge. CVA /Expressive aphasia/ R. Sided weakness  The admitting M.D. spoke  with her neurologist leading her to presentation on her history of CVA and antiphospholipid antibody syndrome and they recommended obtaining an MRI and this was done-and the results are as above of, with multifocal chronic chronic hemorrhagic infarcts. Occult neurology back-spoke with Dr Nicole Kindred and reviewed the MRI findings and asked that given the fact that these are chronic hemorrhagic infarcts his Coumadin and needed to be discontinued .-he stated that they are punctate/small and that she needed to be continued on  her Coumadin. He stated he would recommended no changes in her treatment at this time. She is to follow up at the Coumadin clinic for PT/INR as above. Speech therapy was consulted and saw the patient and-a swallow evaluation was done and a regular diet with thin liquids was recommended. A speech pathologist consultation was also done to evaluate for aphasia and outpatient speech therapy was recommended for followup. The patient is to continue with her neuro-rehabilitation as scheduled upon discharge. LUPUS/history of antiphospholipid antibody syndrome  -As above patient was maintained on her Coumadin, and her INR is therapeutic today. She is to follow up outpatient as above. She was maintained on her outpatient medications. KIDNEY TRANSPLANTATION, HX OF (08/22/2007) (06/17/2011)  She was maintained on her outpatient medications and is to continue them upon discharge Hypokalemia  Potassium was replaced.     Discharge Vitals:  BP 155/94  Pulse 90  Temp(Src) 98.6 F (37 C) (Oral)  Resp 17  Ht 5\' 4"  (1.626 m)  Wt 83.5 kg (184 lb 1.4 oz)  BMI 31.60 kg/m2  SpO2 95%  Discharge Labs:  Results for orders placed during the hospital encounter of 06/17/11 (from the past 24 hour(s))  GLUCOSE, CAPILLARY     Status: Abnormal   Collection Time   06/18/11  5:23 PM      Component Value Range   Glucose-Capillary 105 (*) 70 - 99 (mg/dL)  GLUCOSE, CAPILLARY     Status: Abnormal   Collection Time    06/18/11  8:42 PM      Component Value Range   Glucose-Capillary 140 (*) 70 - 99 (mg/dL)   Comment 1 Documented in Chart     Comment 2 Notify RN    PROTIME-INR     Status: Abnormal   Collection Time   06/19/11  5:00 AM      Component Value Range   Prothrombin Time 25.4 (*) 11.6 - 15.2 (seconds)   INR 2.27 (*) 0.00 - [id number]   BASIC METABOLIC PANEL     Status: Abnormal   Collection Time   06/19/11  5:00 AM      Component Value Range   Sodium 142  135 - 145 (mEq/L)   Potassium 3.5  3.5 - 5.1 (mEq/L)   Chloride 116 (*) 96 - 112 (mEq/L)   CO2 20  19 - 32 (mEq/L)   Glucose,  Bld 547 (*) 70 - 99 (mg/dL)   BUN 10  6 - 23 (mg/dL)   Creatinine, Ser 0.52  0.50 - 1.10 (mg/dL)   Calcium 7.7 (*) 8.4 - 10.5 (mg/dL)   GFR calc non Af Amer >90  >90 (mL/min)   GFR calc Af Amer >90  >90 (mL/min)  GLUCOSE, CAPILLARY     Status: Normal   Collection Time   06/19/11  8:25 AM      Component Value Range   Glucose-Capillary 86  70 - 99 (mg/dL)  GLUCOSE, CAPILLARY     Status: Normal   Collection Time   06/19/11 12:05 PM      Component Value Range   Glucose-Capillary 95  70 - 99 (mg/dL)    Signed: Sheila Oats 06/19/2011, 2:09 PM

## 2011-06-20 NOTE — ED Provider Notes (Signed)
I saw and evaluated the patient, reviewed the resident's note and I agree with the findings and plan.   Delora Fuel, MD [id number] [id number]

## 2011-06-21 ENCOUNTER — Ambulatory Visit (INDEPENDENT_AMBULATORY_CARE_PROVIDER_SITE_OTHER): Payer: PRIVATE HEALTH INSURANCE | Admitting: *Deleted

## 2011-06-21 DIAGNOSIS — I2699 Other pulmonary embolism without acute cor pulmonale: Secondary | ICD-10-CM

## 2011-06-21 DIAGNOSIS — I6350 Cerebral infarction due to unspecified occlusion or stenosis of unspecified cerebral artery: Secondary | ICD-10-CM

## 2011-06-21 LAB — POCT INR: INR: 2.1

## 2011-06-21 MED ORDER — WARFARIN SODIUM 5 MG PO TABS: 5.0000 mg | ORAL_TABLET | Freq: Every day | ORAL | Status: DC

## 2011-06-21 NOTE — Progress Notes (Signed)
   CARE MANAGEMENT NOTE 06/21/2011  Patient:  Connie Ruiz, Connie Ruiz   Account Number:  [id number]  Date Initiated:  06/18/2011  Documentation initiated by:  Marvetta Gibbons  Subjective/Objective Assessment:   Pt admitted with ?DVT     Action/Plan:   PTA pt lived at home alone, was independent with ADLs   Anticipated DC Date:  06/19/2011   Anticipated DC Plan:  [place]  CM consult      Choice offered to / List presented to:             Status of service:  Completed, signed off Medicare Important Message given?   (If response is "NO", the following Medicare IM given date fields will be blank) Date Medicare IM given:   Date Additional Medicare IM given:    Discharge Disposition:  HOME/SELF CARE  Per UR Regulation:    Comments:  PCP- Loanne Drilling  06/21/11 9:53 Tomi Bamberger RN, BSN [phone] patient dc to home on 06/19/11.

## 2011-06-22 ENCOUNTER — Ambulatory Visit: Payer: PRIVATE HEALTH INSURANCE

## 2011-06-23 ENCOUNTER — Encounter: Payer: PRIVATE HEALTH INSURANCE | Admitting: *Deleted

## 2011-06-25 ENCOUNTER — Ambulatory Visit: Payer: PRIVATE HEALTH INSURANCE

## 2011-06-29 ENCOUNTER — Ambulatory Visit: Payer: PRIVATE HEALTH INSURANCE

## 2011-07-02 ENCOUNTER — Telehealth: Payer: Self-pay | Admitting: *Deleted

## 2011-07-02 DIAGNOSIS — R32 Unspecified urinary incontinence: Secondary | ICD-10-CM

## 2011-07-02 NOTE — Telephone Encounter (Signed)
Pt daugnter is concerned because pt's urinary incontinence is getting worse. Pt is asking for MD's advisement on what they can do regarding incontinence.

## 2011-07-02 NOTE — Telephone Encounter (Signed)
Pt's daughter informed of referral.

## 2011-07-02 NOTE — Telephone Encounter (Signed)
i ref Connie Ruiz

## 2011-07-06 ENCOUNTER — Ambulatory Visit: Payer: PRIVATE HEALTH INSURANCE

## 2011-07-07 ENCOUNTER — Telehealth: Payer: Self-pay | Admitting: Endocrinology

## 2011-07-07 NOTE — Telephone Encounter (Signed)
PT NEEDS A LETTER TO EXCUSE HER FROM [employer] IN FEB.  PLEASE CALL HER DAUGHTER WHEN READY.

## 2011-07-11 ENCOUNTER — Encounter: Payer: Self-pay | Admitting: Endocrinology

## 2011-07-12 ENCOUNTER — Ambulatory Visit (INDEPENDENT_AMBULATORY_CARE_PROVIDER_SITE_OTHER): Payer: PRIVATE HEALTH INSURANCE | Admitting: Pharmacist

## 2011-07-12 ENCOUNTER — Ambulatory Visit: Payer: PRIVATE HEALTH INSURANCE | Attending: Endocrinology

## 2011-07-12 DIAGNOSIS — IMO0001 Reserved for inherently not codable concepts without codable children: Secondary | ICD-10-CM | POA: Insufficient documentation

## 2011-07-12 DIAGNOSIS — I2699 Other pulmonary embolism without acute cor pulmonale: Secondary | ICD-10-CM

## 2011-07-12 DIAGNOSIS — I6350 Cerebral infarction due to unspecified occlusion or stenosis of unspecified cerebral artery: Secondary | ICD-10-CM

## 2011-07-12 DIAGNOSIS — R4701 Aphasia: Secondary | ICD-10-CM | POA: Insufficient documentation

## 2011-07-12 LAB — POCT INR: INR: 2.6

## 2011-07-12 NOTE — Telephone Encounter (Signed)
i printed 

## 2011-07-13 NOTE — Telephone Encounter (Signed)
Message left for daughter informing that letter is ready for pick up in cabinet

## 2011-07-20 ENCOUNTER — Ambulatory Visit: Payer: PRIVATE HEALTH INSURANCE

## 2011-07-27 ENCOUNTER — Ambulatory Visit: Payer: PRIVATE HEALTH INSURANCE

## 2011-08-09 ENCOUNTER — Ambulatory Visit (INDEPENDENT_AMBULATORY_CARE_PROVIDER_SITE_OTHER): Payer: PRIVATE HEALTH INSURANCE | Admitting: *Deleted

## 2011-08-09 DIAGNOSIS — I6350 Cerebral infarction due to unspecified occlusion or stenosis of unspecified cerebral artery: Secondary | ICD-10-CM

## 2011-08-09 DIAGNOSIS — Z0279 Encounter for issue of other medical certificate: Secondary | ICD-10-CM

## 2011-08-09 DIAGNOSIS — I2699 Other pulmonary embolism without acute cor pulmonale: Secondary | ICD-10-CM

## 2011-08-09 LAB — POCT INR: INR: 2.5

## 2011-08-19 ENCOUNTER — Telehealth: Payer: Self-pay | Admitting: *Deleted

## 2011-08-19 DIAGNOSIS — I6789 Other cerebrovascular disease: Secondary | ICD-10-CM

## 2011-08-19 NOTE — Telephone Encounter (Signed)
[place] Worker called for order of new hospital bed and mattress for pt.

## 2011-08-19 NOTE — Telephone Encounter (Signed)
ok 

## 2011-08-19 NOTE — Telephone Encounter (Signed)
Orders Printed-awaiting MD's signature-needs to be faxed to [place] per social worker

## 2011-08-20 NOTE — Telephone Encounter (Signed)
Orders faxed to [facility]. 

## 2011-09-06 ENCOUNTER — Ambulatory Visit (INDEPENDENT_AMBULATORY_CARE_PROVIDER_SITE_OTHER): Payer: PRIVATE HEALTH INSURANCE | Admitting: *Deleted

## 2011-09-06 DIAGNOSIS — I6350 Cerebral infarction due to unspecified occlusion or stenosis of unspecified cerebral artery: Secondary | ICD-10-CM

## 2011-09-06 DIAGNOSIS — I2699 Other pulmonary embolism without acute cor pulmonale: Secondary | ICD-10-CM

## 2011-09-06 LAB — POCT INR: INR: 3.1

## 2011-10-04 ENCOUNTER — Ambulatory Visit (INDEPENDENT_AMBULATORY_CARE_PROVIDER_SITE_OTHER): Payer: PRIVATE HEALTH INSURANCE | Admitting: Pharmacist

## 2011-10-04 DIAGNOSIS — I2699 Other pulmonary embolism without acute cor pulmonale: Secondary | ICD-10-CM

## 2011-10-04 DIAGNOSIS — I6350 Cerebral infarction due to unspecified occlusion or stenosis of unspecified cerebral artery: Secondary | ICD-10-CM

## 2011-10-04 LAB — POCT INR: INR: 1.8

## 2011-10-12 ENCOUNTER — Other Ambulatory Visit: Payer: Self-pay | Admitting: *Deleted

## 2011-10-12 NOTE — Telephone Encounter (Signed)
Pt is requesting an rx for productive cough x 3 days to be sent to [facility]

## 2011-10-12 NOTE — Telephone Encounter (Signed)
Ov tomorrow 

## 2011-10-13 ENCOUNTER — Ambulatory Visit (INDEPENDENT_AMBULATORY_CARE_PROVIDER_SITE_OTHER)
Admission: RE | Admit: 2011-10-13 | Discharge: 2011-10-13 | Disposition: A | Discharge status: Home / Self Care | Payer: PRIVATE HEALTH INSURANCE | Source: Ambulatory Visit | Attending: Endocrinology | Admitting: Endocrinology

## 2011-10-13 ENCOUNTER — Ambulatory Visit (INDEPENDENT_AMBULATORY_CARE_PROVIDER_SITE_OTHER): Payer: PRIVATE HEALTH INSURANCE | Admitting: Endocrinology

## 2011-10-13 ENCOUNTER — Other Ambulatory Visit (INDEPENDENT_AMBULATORY_CARE_PROVIDER_SITE_OTHER): Payer: PRIVATE HEALTH INSURANCE

## 2011-10-13 ENCOUNTER — Encounter: Payer: Self-pay | Admitting: Endocrinology

## 2011-10-13 VITALS — BP 132/94 | HR 104 | Temp 100.4°F | Wt 186.0 lb

## 2011-10-13 DIAGNOSIS — N058 Unspecified nephritic syndrome with other morphologic changes: Secondary | ICD-10-CM

## 2011-10-13 DIAGNOSIS — R059 Cough, unspecified: Secondary | ICD-10-CM

## 2011-10-13 DIAGNOSIS — E1165 Type 2 diabetes mellitus with hyperglycemia: Secondary | ICD-10-CM

## 2011-10-13 DIAGNOSIS — E1129 Type 2 diabetes mellitus with other diabetic kidney complication: Secondary | ICD-10-CM

## 2011-10-13 DIAGNOSIS — R05 Cough: Secondary | ICD-10-CM

## 2011-10-13 LAB — HEMOGLOBIN A1C: Hgb A1c MFr Bld: 6.2 % (ref 4.6–6.5)

## 2011-10-13 MED ORDER — BENZONATATE 200 MG PO CAPS: 200.0000 mg | ORAL_CAPSULE | Freq: Three times a day (TID) | ORAL | Status: AC | PRN

## 2011-10-13 MED ORDER — CEFUROXIME AXETIL 250 MG PO TABS: 250.0000 mg | ORAL_TABLET | Freq: Two times a day (BID) | ORAL | Status: AC

## 2011-10-13 NOTE — Telephone Encounter (Signed)
Left message for pt to callback office.  

## 2011-10-13 NOTE — Progress Notes (Signed)
Subjective:    Patient ID: Connie Ruiz, female    DOB: 01/01/1961, 51 y.o.   MRN: [id number]  HPI Pt states few days of moderate prod-quality cough in the chest, and assoc sore throat.   Past Medical History  Diagnosis Date  . THYROID NODULE, LEFT 04/10/2009  . DIABETES MELLITUS, TYPE II 08/21/2007  . HYPERLIPIDEMIA 08/21/2007  . GOUT 08/21/2007  . HYPERTENSION 08/21/2007    Dr. Andree Elk, [facility] 08/21/2007  . CVA 04/17/2010  . CEREBROVASCULAR ACCIDENT, ACUTE 04/15/2010  . GERD 08/21/2007  . RENAL INSUFFICIENCY 08/21/2007  . LUPUS 08/21/2007  . OSTEOPOROSIS 08/21/2007    Rheumatol at [facility]  . DVT, HX OF 08/21/2007  . CLOSTRIDIUM DIFFICILE COLITIS, HX OF 08/21/2007  . KIDNEY TRANSPLANTATION, HX OF 08/22/2007    s/p renal transplant-Dr. Andree Elk, [facility]  . Pulmonary embolism 07/16/2010  . Renal failure   . Current use of long term anticoagulation     Dr. Andree Elk, [facility]  . Depression     Dr. Andree Elk, [facility]    Past Surgical History  Procedure Date  . Cholecystectomy   . Tubal ligation     History   Social History  . Marital Status: Divorced    Spouse Name: N/A    Number of Children: 1  . Years of Education: N/A   Occupational History  . DISABILITY    Social History Main Topics  . Smoking status: Never Smoker   . Smokeless tobacco: Not on file  . Alcohol Use: No  . Drug Use: No  . Sexually Active:    Other Topics Concern  . Not on file   Social History Narrative   RetiredRegular exercise-no    Current Outpatient Prescriptions on File Prior to Visit  Medication Sig Dispense Refill  . calcitRIOL (ROCALTROL) 0.25 MCG capsule Take 1 capsule (0.25 mcg total) by mouth daily.  30 capsule  11  . diltiazem (DILACOR XR) 180 MG 24 hr capsule Take 180 mg by mouth daily.        Marland Kitchen esomeprazole (NEXIUM) 40 MG capsule Take 40 mg by mouth daily.        . folic acid (FOLVITE) 1 MG tablet Take 1 mg by mouth daily.        Marland Kitchen glimepiride (AMARYL) 1 MG tablet Take  1 tablet (1 mg total) by mouth daily before breakfast.  30 tablet  11  . glucose blood (PRODIGY NO CODING BLOOD GLUC) test strip Use as instructed once daily dx 250.00      . guaiFENesin (MUCINEX) 600 MG 12 hr tablet Take 1,200 mg by mouth 2 (two) times daily as needed. For cough symptoms      . mycophenolate (CELLCEPT) 250 MG capsule Take 250 mg by mouth daily.        . predniSONE (DELTASONE) 1 MG tablet Take 3 mg by mouth daily.      . sertraline (ZOLOFT) 100 MG tablet Take 100 mg by mouth daily.        . tacrolimus (PROGRAF) 1 MG capsule Take 1 mg by mouth 2 (two) times daily.        Marland Kitchen warfarin (COUMADIN) 5 MG tablet Take 1 tablet (5 mg total) by mouth daily. Take as prescribed by coumadin clinic  35 tablet  3  . ZINC SULFATE PO Take 1 tablet by mouth daily. OTC.      Marland Kitchen DISCONTD: warfarin (COUMADIN) 1 MG tablet Take 5 mg by mouth daily.  Allergies  Allergen Reactions  . Oxycodone-Acetaminophen Shortness Of Breath and Nausea Only  . Propoxyphene-Acetaminophen Shortness Of Breath and Nausea Only  . Sulfonamide Derivatives Shortness Of Breath and Nausea Only  . Codeine     REACTION: nausea  . Latex     Family History  Problem Relation Age of Onset  . Cancer Neg Hx   . Heart attack Mother   . Heart disease Father     BP 132/94  Pulse 104  Temp(Src) 100.4 F (38 C) (Oral)  Wt 186 lb (84.369 kg)  SpO2 96%    Review of Systems She has fever, bilat earache, wheezing, and nasal congestion.    Objective:   Physical Exam VITAL SIGNS:  See vs page GENERAL: no distress head: no deformity eyes: no periorbital swelling, there is bilat proptosis external nose and ears are normal mouth: no lesion seen Both tm's are red (R>L) LUNGS:  Clear to auscultation EXTEMITIES: no ulcer on the feet. feet are of normal color and temp. 1+ bilat leg edema. There is a healed ulcer at the right leg, now hypopigmented. There is bilateral onychomycosis.  The toes on the right foot are  medially deviated. PULSES: dorsalis pedis intact bilat.  NEURO: sensation is intact to touch on the feet, but decreased from normal.  CXR: NAD Lab Results  Component Value Date   HGBA1C 6.2 10/13/2011      Assessment & Plan:  Acute bronchitis, new Dm, well-controlled HTN, with prob situational component

## 2011-10-13 NOTE — Telephone Encounter (Signed)
Appointment scheduled 11:00am today.

## 2011-10-13 NOTE — Patient Instructions (Addendum)
i have sent 2 prescriptions to your pharmacy: antibiotic and cough medication. here is a sample of "dulera-100."  take 1 puff 2x a day.  rinse mouth after using. A chest-x-ray and a blood test are requested for you today.  You will receive a letter with results. Please come back for a follow-up appointment in 3 months. i'll do the form for diabetic shoes.

## 2011-10-14 ENCOUNTER — Telehealth: Payer: Self-pay | Admitting: *Deleted

## 2011-10-14 NOTE — Telephone Encounter (Signed)
Called pt to inform of lab and xray results, left message for pt to callback office (letter also mailed to pt).

## 2011-10-15 ENCOUNTER — Encounter (HOSPITAL_COMMUNITY): Payer: Self-pay | Admitting: Emergency Medicine

## 2011-10-15 ENCOUNTER — Emergency Department (INDEPENDENT_AMBULATORY_CARE_PROVIDER_SITE_OTHER)
Admission: EM | Admit: 2011-10-15 | Discharge: 2011-10-15 | Disposition: A | Discharge status: Home / Self Care | Payer: PRIVATE HEALTH INSURANCE | Source: Home / Self Care | Attending: Emergency Medicine | Admitting: Emergency Medicine

## 2011-10-15 ENCOUNTER — Ambulatory Visit: Payer: PRIVATE HEALTH INSURANCE | Admitting: Endocrinology

## 2011-10-15 ENCOUNTER — Emergency Department (INDEPENDENT_AMBULATORY_CARE_PROVIDER_SITE_OTHER): Payer: PRIVATE HEALTH INSURANCE

## 2011-10-15 DIAGNOSIS — J40 Bronchitis, not specified as acute or chronic: Secondary | ICD-10-CM

## 2011-10-15 MED ORDER — CEFTRIAXONE SODIUM 1 G IJ SOLR
1.0000 g | Freq: Once | INTRAMUSCULAR | Status: AC
  Administered 2011-10-15: 1 g via INTRAMUSCULAR

## 2011-10-15 MED ORDER — LIDOCAINE HCL (PF) 1 % IJ SOLN
INTRAMUSCULAR | Status: AC
  Filled 2011-10-15: qty 5

## 2011-10-15 MED ORDER — CEFTRIAXONE SODIUM 1 G IJ SOLR
INTRAMUSCULAR | Status: AC
  Filled 2011-10-15: qty 10

## 2011-10-15 MED ORDER — HYDROCOD POLST-CHLORPHEN POLST 10-8 MG/5ML PO LQCR: 5.0000 mL | Freq: Two times a day (BID) | ORAL | Status: DC

## 2011-10-15 NOTE — Discharge Instructions (Signed)
Bronchitis Bronchitis is a problem of the air tubes leading to your lungs. This problem makes it hard for air to get in and out of the lungs. You may cough a lot because your air tubes are narrow. Going without care can cause lasting (chronic) bronchitis. HOME CARE   Drink enough fluids to keep your pee (urine) clear or pale yellow.   Use a cool mist humidifier.   Quit smoking if you smoke. If you keep smoking, the bronchitis might not get better.   Only take medicine as told by your doctor.  GET HELP RIGHT AWAY IF:   Coughing keeps you awake.   You start to wheeze.   You become more sick or weak.   You have a hard time breathing or get short of breath.   You cough up blood.   Coughing lasts more than 2 weeks.   You have a fever.   Your baby is older than 3 months with a rectal temperature of 102 F (38.9 C) or higher.   Your baby is 14 months old or younger with a rectal temperature of 100.4 F (38 C) or higher.  MAKE SURE YOU:  Understand these instructions.   Will watch your condition.   Will get help right away if you are not doing well or get worse.  Document Released: 11/10/2007 Document Revised: 05/13/2011 Document Reviewed: 04/25/2009 [facility] Patient Information 2012 [place].

## 2011-10-15 NOTE — ED Notes (Signed)
Saw physician Tuesday for cough/cold symptoms.  Patient saw dr Loanne Drilling.  Patient reports she is getting worse.  Increasing sob, feeling worse, chest hurting.

## 2011-10-15 NOTE — ED Provider Notes (Signed)
History     CSN: [id number]  Arrival date & time 10/15/11  1641   First MD Initiated Contact with Patient 10/15/11 1729      Chief Complaint  Patient presents with  . URI    (Consider location/radiation/quality/duration/timing/severity/associated sxs/prior treatment) Patient is a 51 y.o. female presenting with cough. The history is provided by the patient. No language interpreter was used.  Cough This is a recurrent problem. The current episode started more than 2 days ago. The problem occurs constantly. The problem has been gradually worsening. The cough is productive of sputum. There has been no fever. The fever has been present for less than 1 day. Pertinent negatives include no chest pain. She has tried nothing for the symptoms. She is not a smoker. Her past medical history does not include pneumonia or COPD.  Pt complains of worsening cough.    Past Medical History  Diagnosis Date  . THYROID NODULE, LEFT 04/10/2009  . DIABETES MELLITUS, TYPE II 08/21/2007  . HYPERLIPIDEMIA 08/21/2007  . GOUT 08/21/2007  . HYPERTENSION 08/21/2007    Dr. Andree Elk, [place] 08/21/2007  . CVA 04/17/2010  . CEREBROVASCULAR ACCIDENT, ACUTE 04/15/2010  . GERD 08/21/2007  . RENAL INSUFFICIENCY 08/21/2007  . LUPUS 08/21/2007  . OSTEOPOROSIS 08/21/2007    [facility]umatol at [facility]  . DVT, HX OF 08/21/2007  . CLOSTRIDIUM DIFFICILE COLITIS, HX OF 08/21/2007  . KIDNEY TRANSPLANTATION, HX OF 08/22/2007    s/p renal transplant-Dr. Andree Elk, [facility]  . Pulmonary embolism 07/16/2010  . Renal failure   . Current use of long term anticoagulation     Dr. Andree Elk, [facility]  . Depression     Dr. Andree Elk, [facility]    Past Surgical History  Procedure Date  . Cholecystectomy   . Tubal ligation     Family History  Problem Relation Age of Onset  . Cancer Neg Hx   . Heart attack Mother   . Heart disease Father     History  Substance Use Topics  . Smoking status: Never Smoker   . Smokeless tobacco:  Not on file  . Alcohol Use: No    OB History    Grav Para Term Preterm Abortions TAB SAB Ect Mult Living                  Review of Systems  Respiratory: Positive for cough.   Cardiovascular: Negative for chest pain.  All other systems reviewed and are negative.    Allergies  Oxycodone-acetaminophen; Propoxyphene-acetaminophen; Sulfonamide derivatives; Codeine; and Latex  Home Medications   Current Outpatient Rx  Name Route Sig Dispense Refill  . BENZONATATE 200 MG PO CAPS Oral Take 1 capsule (200 mg total) by mouth 3 (three) times daily as needed for cough. 30 capsule 1  . CALCITRIOL 0.25 MCG PO CAPS Oral Take 1 capsule (0.25 mcg total) by mouth daily. 30 capsule 11  . CEFUROXIME AXETIL 250 MG PO TABS Oral Take 1 tablet (250 mg total) by mouth 2 (two) times daily. 14 tablet 0  . DILTIAZEM HCL ER 180 MG PO CP24 Oral Take 180 mg by mouth daily.      Marland Kitchen ESOMEPRAZOLE MAGNESIUM 40 MG PO CPDR Oral Take 40 mg by mouth daily.      Marland Kitchen FOLIC ACID 1 MG PO TABS Oral Take 1 mg by mouth daily.      Marland Kitchen GLIMEPIRIDE 1 MG PO TABS Oral Take 1 tablet (1 mg total) by mouth daily before breakfast. 30 tablet  11  . GLUCOSE BLOOD VI STRP  Use as instructed once daily dx 250.00    . GUAIFENESIN ER 600 MG PO TB12 Oral Take 1,200 mg by mouth 2 (two) times daily as needed. For cough symptoms    . MYCOPHENOLATE MOFETIL 250 MG PO CAPS Oral Take 250 mg by mouth daily.      Marland Kitchen PREDNISONE 1 MG PO TABS Oral Take 3 mg by mouth daily.    . SERTRALINE HCL 100 MG PO TABS Oral Take 100 mg by mouth daily.      Marland Kitchen TACROLIMUS 1 MG PO CAPS Oral Take 1 mg by mouth 2 (two) times daily.      . WARFARIN SODIUM 5 MG PO TABS Oral Take 1 tablet (5 mg total) by mouth daily. Take as prescribed by coumadin clinic 35 tablet 3  . ZINC SULFATE PO Oral Take 1 tablet by mouth daily. OTC.      BP 133/63  Pulse 102  Temp(Src) 100.1 F (37.8 C) (Oral)  Resp 20  SpO2 100%  Physical Exam  Vitals reviewed. Constitutional: She appears  well-developed and well-nourished.  HENT:  Head: Normocephalic and atraumatic.  Right Ear: External ear normal.  Left Ear: External ear normal.  Eyes: Conjunctivae and EOM are normal. Pupils are equal, round, and reactive to light.  Neck: Normal range of motion.  Pulmonary/Chest: Effort normal and breath sounds normal.  Abdominal: Soft.  Musculoskeletal: Normal range of motion.  Neurological: She is alert.  Skin: Skin is warm.    ED Course  Procedures (including critical care time)  Labs Reviewed - No data to display Dg Chest 2 View  10/15/2011  *RADIOLOGY REPORT*  Clinical Data: Cough and congestion.  CHEST - 2 VIEW  Comparison: Plain films of the chest 10/13/2011 and 03/26/2009.  Findings:  Mild scar in the lingula is unchanged.  The lungs are otherwise clear.  Heart size is upper normal.  No pneumothorax or pleural fluid.  IMPRESSION: No acute disease.  Original Report Authenticated By: Arvid Right. Luther Parody, M.D.     No diagnosis found.    MDM    I will add z pack to pt's treatment.         [place], [place] 10/15/11 [place], [place] 10/18/11 1057

## 2011-10-15 NOTE — ED Notes (Signed)
Patient aware waiting post injection is policy

## 2011-10-18 NOTE — Telephone Encounter (Signed)
Pt's daughter informed of lab results.

## 2011-10-18 NOTE — ED Provider Notes (Signed)
Medical screening examination/treatment/procedure(s) were performed by non-physician practitioner and as supervising physician I was immediately available for consultation/collaboration.  Cherly Beach MD   Cherly Beach, MD 10/18/11 [phone]

## 2011-10-26 ENCOUNTER — Ambulatory Visit (INDEPENDENT_AMBULATORY_CARE_PROVIDER_SITE_OTHER): Payer: PRIVATE HEALTH INSURANCE | Admitting: Pharmacist

## 2011-10-26 DIAGNOSIS — I6350 Cerebral infarction due to unspecified occlusion or stenosis of unspecified cerebral artery: Secondary | ICD-10-CM

## 2011-10-26 DIAGNOSIS — I2699 Other pulmonary embolism without acute cor pulmonale: Secondary | ICD-10-CM

## 2011-10-26 LAB — POCT INR: INR: 2.6

## 2011-11-14 ENCOUNTER — Other Ambulatory Visit: Payer: Self-pay | Admitting: Endocrinology

## 2011-11-23 ENCOUNTER — Ambulatory Visit (INDEPENDENT_AMBULATORY_CARE_PROVIDER_SITE_OTHER): Payer: PRIVATE HEALTH INSURANCE | Admitting: *Deleted

## 2011-11-23 DIAGNOSIS — I6350 Cerebral infarction due to unspecified occlusion or stenosis of unspecified cerebral artery: Secondary | ICD-10-CM

## 2011-11-23 DIAGNOSIS — I2699 Other pulmonary embolism without acute cor pulmonale: Secondary | ICD-10-CM

## 2011-11-23 LAB — POCT INR: INR: 4.1

## 2011-12-08 ENCOUNTER — Ambulatory Visit (INDEPENDENT_AMBULATORY_CARE_PROVIDER_SITE_OTHER): Payer: PRIVATE HEALTH INSURANCE | Admitting: *Deleted

## 2011-12-08 DIAGNOSIS — I2699 Other pulmonary embolism without acute cor pulmonale: Secondary | ICD-10-CM

## 2011-12-08 DIAGNOSIS — I6350 Cerebral infarction due to unspecified occlusion or stenosis of unspecified cerebral artery: Secondary | ICD-10-CM

## 2011-12-08 LAB — PROTIME-INR
INR: 8.9 ratio (ref 0.8–1.0)
Prothrombin Time: 99.6 s (ref 10.2–12.4)

## 2011-12-08 LAB — POCT INR: INR: 8

## 2011-12-08 NOTE — Patient Instructions (Signed)
Anticoagulation safety reviewed with patient and family member, instructed to seek ED for noted bleeding due to elevated INR

## 2011-12-14 ENCOUNTER — Ambulatory Visit (INDEPENDENT_AMBULATORY_CARE_PROVIDER_SITE_OTHER): Payer: PRIVATE HEALTH INSURANCE | Admitting: *Deleted

## 2011-12-14 DIAGNOSIS — I2699 Other pulmonary embolism without acute cor pulmonale: Secondary | ICD-10-CM

## 2011-12-14 DIAGNOSIS — I6350 Cerebral infarction due to unspecified occlusion or stenosis of unspecified cerebral artery: Secondary | ICD-10-CM

## 2011-12-14 LAB — POCT INR: INR: 1.2

## 2011-12-21 ENCOUNTER — Ambulatory Visit (INDEPENDENT_AMBULATORY_CARE_PROVIDER_SITE_OTHER): Payer: PRIVATE HEALTH INSURANCE

## 2011-12-21 DIAGNOSIS — I6350 Cerebral infarction due to unspecified occlusion or stenosis of unspecified cerebral artery: Secondary | ICD-10-CM

## 2011-12-21 DIAGNOSIS — I2699 Other pulmonary embolism without acute cor pulmonale: Secondary | ICD-10-CM

## 2011-12-21 LAB — POCT INR: INR: 2

## 2011-12-31 ENCOUNTER — Ambulatory Visit (INDEPENDENT_AMBULATORY_CARE_PROVIDER_SITE_OTHER): Payer: PRIVATE HEALTH INSURANCE | Admitting: *Deleted

## 2011-12-31 DIAGNOSIS — I2699 Other pulmonary embolism without acute cor pulmonale: Secondary | ICD-10-CM

## 2011-12-31 DIAGNOSIS — I6350 Cerebral infarction due to unspecified occlusion or stenosis of unspecified cerebral artery: Secondary | ICD-10-CM

## 2011-12-31 LAB — POCT INR: INR: 3

## 2012-01-14 ENCOUNTER — Ambulatory Visit (INDEPENDENT_AMBULATORY_CARE_PROVIDER_SITE_OTHER): Payer: PRIVATE HEALTH INSURANCE

## 2012-01-14 ENCOUNTER — Telehealth: Payer: Self-pay | Admitting: Endocrinology

## 2012-01-14 DIAGNOSIS — I6350 Cerebral infarction due to unspecified occlusion or stenosis of unspecified cerebral artery: Secondary | ICD-10-CM

## 2012-01-14 DIAGNOSIS — I2699 Other pulmonary embolism without acute cor pulmonale: Secondary | ICD-10-CM

## 2012-01-14 LAB — POCT INR: INR: 4

## 2012-01-14 NOTE — Telephone Encounter (Signed)
Forward 3 pages from [facility] to Dr. Renato Shin for review on 01-14-12 ym

## 2012-01-18 ENCOUNTER — Other Ambulatory Visit: Payer: Self-pay | Admitting: Endocrinology

## 2012-01-28 ENCOUNTER — Ambulatory Visit (INDEPENDENT_AMBULATORY_CARE_PROVIDER_SITE_OTHER): Payer: PRIVATE HEALTH INSURANCE | Admitting: *Deleted

## 2012-01-28 DIAGNOSIS — I6350 Cerebral infarction due to unspecified occlusion or stenosis of unspecified cerebral artery: Secondary | ICD-10-CM

## 2012-01-28 DIAGNOSIS — I2699 Other pulmonary embolism without acute cor pulmonale: Secondary | ICD-10-CM

## 2012-01-28 LAB — POCT INR: INR: 2.3

## 2012-02-04 ENCOUNTER — Other Ambulatory Visit: Payer: Self-pay | Admitting: Endocrinology

## 2012-02-08 ENCOUNTER — Telehealth: Payer: Self-pay | Admitting: Endocrinology

## 2012-02-08 NOTE — Telephone Encounter (Signed)
Received refill request from [facility] request refill for WARFARIN SOD 5mg  . Rx last written and pt last seen   . Please advise Thanks

## 2012-02-08 NOTE — Telephone Encounter (Signed)
Thanks

## 2012-02-08 NOTE — Telephone Encounter (Signed)
This was refilled last week

## 2012-02-08 NOTE — Telephone Encounter (Signed)
Received refill request from [facility] 2nd Request for WARFARIN SOD 5mg  . Please advise. Thanks.

## 2012-02-09 ENCOUNTER — Encounter: Payer: Self-pay | Admitting: Pharmacist

## 2012-02-16 ENCOUNTER — Ambulatory Visit (INDEPENDENT_AMBULATORY_CARE_PROVIDER_SITE_OTHER): Payer: PRIVATE HEALTH INSURANCE | Admitting: *Deleted

## 2012-02-16 DIAGNOSIS — I6350 Cerebral infarction due to unspecified occlusion or stenosis of unspecified cerebral artery: Secondary | ICD-10-CM

## 2012-02-16 DIAGNOSIS — I2699 Other pulmonary embolism without acute cor pulmonale: Secondary | ICD-10-CM

## 2012-02-16 LAB — POCT INR: INR: 3.4

## 2012-03-08 ENCOUNTER — Ambulatory Visit (INDEPENDENT_AMBULATORY_CARE_PROVIDER_SITE_OTHER): Payer: PRIVATE HEALTH INSURANCE | Admitting: *Deleted

## 2012-03-08 DIAGNOSIS — I2699 Other pulmonary embolism without acute cor pulmonale: Secondary | ICD-10-CM

## 2012-03-08 DIAGNOSIS — I6350 Cerebral infarction due to unspecified occlusion or stenosis of unspecified cerebral artery: Secondary | ICD-10-CM

## 2012-03-08 LAB — POCT INR: INR: 2

## 2012-03-23 ENCOUNTER — Emergency Department (HOSPITAL_COMMUNITY)
Admission: EM | Admit: 2012-03-23 | Discharge: 2012-03-23 | Disposition: A | Discharge status: Home / Self Care | Payer: PRIVATE HEALTH INSURANCE | Attending: Emergency Medicine | Admitting: Emergency Medicine

## 2012-03-23 ENCOUNTER — Encounter (HOSPITAL_COMMUNITY): Payer: Self-pay

## 2012-03-23 ENCOUNTER — Emergency Department (HOSPITAL_COMMUNITY): Payer: PRIVATE HEALTH INSURANCE

## 2012-03-23 DIAGNOSIS — Z8673 Personal history of transient ischemic attack (TIA), and cerebral infarction without residual deficits: Secondary | ICD-10-CM

## 2012-03-23 DIAGNOSIS — E119 Type 2 diabetes mellitus without complications: Secondary | ICD-10-CM | POA: Insufficient documentation

## 2012-03-23 DIAGNOSIS — R531 Weakness: Secondary | ICD-10-CM

## 2012-03-23 DIAGNOSIS — I10 Essential (primary) hypertension: Secondary | ICD-10-CM | POA: Insufficient documentation

## 2012-03-23 DIAGNOSIS — G319 Degenerative disease of nervous system, unspecified: Secondary | ICD-10-CM | POA: Insufficient documentation

## 2012-03-23 DIAGNOSIS — Z7901 Long term (current) use of anticoagulants: Secondary | ICD-10-CM | POA: Insufficient documentation

## 2012-03-23 DIAGNOSIS — I69928 Other speech and language deficits following unspecified cerebrovascular disease: Secondary | ICD-10-CM | POA: Insufficient documentation

## 2012-03-23 DIAGNOSIS — I509 Heart failure, unspecified: Secondary | ICD-10-CM | POA: Insufficient documentation

## 2012-03-23 DIAGNOSIS — R5383 Other fatigue: Secondary | ICD-10-CM | POA: Insufficient documentation

## 2012-03-23 DIAGNOSIS — Z9089 Acquired absence of other organs: Secondary | ICD-10-CM | POA: Insufficient documentation

## 2012-03-23 DIAGNOSIS — R5381 Other malaise: Secondary | ICD-10-CM | POA: Insufficient documentation

## 2012-03-23 DIAGNOSIS — Z79899 Other long term (current) drug therapy: Secondary | ICD-10-CM | POA: Insufficient documentation

## 2012-03-23 LAB — CBC
HCT: 41.7 % (ref 36.0–46.0)
Hemoglobin: 14.2 g/dL (ref 12.0–15.0)
MCH: 30.2 pg (ref 26.0–34.0)
MCHC: 34.1 g/dL (ref 30.0–36.0)
MCV: 88.7 fL (ref 78.0–100.0)
Platelets: 184 10*3/uL (ref 150–400)
RBC: 4.7 MIL/uL (ref 3.87–5.11)
RDW: 13.1 % (ref 11.5–15.5)
WBC: 8.4 10*3/uL (ref 4.0–10.5)

## 2012-03-23 LAB — COMPREHENSIVE METABOLIC PANEL
ALT: 20 U/L (ref 0–35)
AST: 27 U/L (ref 0–37)
Albumin: 3.6 g/dL (ref 3.5–5.2)
Alkaline Phosphatase: 85 U/L (ref 39–117)
BUN: 13 mg/dL (ref 6–23)
CO2: 25 mEq/L (ref 19–32)
Calcium: 9.5 mg/dL (ref 8.4–10.5)
Chloride: 108 mEq/L (ref 96–112)
Creatinine, Ser: 0.61 mg/dL (ref 0.50–1.10)
GFR calc Af Amer: >90 mL/min (ref >90)
GFR calc non Af Amer: >90 mL/min (ref >90)
Glucose, Bld: 129 mg/dL — ABNORMAL HIGH (ref 70–99)
Potassium: 3.1 mEq/L — ABNORMAL LOW (ref 3.5–5.1)
Sodium: 142 mEq/L (ref 135–145)
Total Bilirubin: 0.5 mg/dL (ref 0.3–1.2)
Total Protein: 7.2 g/dL (ref 6.0–8.3)

## 2012-03-23 LAB — POCT I-STAT, CHEM 8
BUN: 12 mg/dL (ref 6–23)
Calcium, Ion: 1.22 mmol/L (ref 1.12–1.23)
Chloride: 108 mEq/L (ref 96–112)
Creatinine, Ser: 0.8 mg/dL (ref 0.50–1.10)
Glucose, Bld: 124 mg/dL — ABNORMAL HIGH (ref 70–99)
HCT: 44 % (ref 36.0–46.0)
Hemoglobin: 15 g/dL (ref 12.0–15.0)
Potassium: 3.2 mEq/L — ABNORMAL LOW (ref 3.5–5.1)
Sodium: 144 mEq/L (ref 135–145)
TCO2: 24 mmol/L (ref 0–100)

## 2012-03-23 LAB — DIFFERENTIAL
Basophils Absolute: 0 10*3/uL (ref 0.0–0.1)
Basophils Relative: 0 % (ref 0–1)
Eosinophils Absolute: 0.1 10*3/uL (ref 0.0–0.7)
Eosinophils Relative: 1 % (ref 0–5)
Lymphocytes Relative: 40 % (ref 12–46)
Lymphs Abs: 3.3 10*3/uL (ref 0.7–4.0)
Monocytes Absolute: 0.7 10*3/uL (ref 0.1–1.0)
Monocytes Relative: 8 % (ref 3–12)
Neutro Abs: 4.3 10*3/uL (ref 1.7–7.7)
Neutrophils Relative %: 51 % (ref 43–77)

## 2012-03-23 LAB — URINALYSIS, ROUTINE W REFLEX MICROSCOPIC
Glucose, UA: NEGATIVE mg/dL
Hgb urine dipstick: NEGATIVE
Ketones, ur: 15 mg/dL — AB
Leukocytes, UA: NEGATIVE
Nitrite: NEGATIVE
Protein, ur: NEGATIVE mg/dL
Specific Gravity, Urine: 1.035 — ABNORMAL HIGH (ref 1.005–1.030)
Urobilinogen, UA: 1 mg/dL (ref 0.0–1.0)
pH: 6 (ref 5.0–8.0)

## 2012-03-23 LAB — RAPID URINE DRUG SCREEN, HOSP PERFORMED
Amphetamines: NOT DETECTED
Barbiturates: NOT DETECTED
Benzodiazepines: NOT DETECTED
Cocaine: NOT DETECTED
Opiates: NOT DETECTED
Tetrahydrocannabinol: NOT DETECTED

## 2012-03-23 LAB — PROTIME-INR
INR: 2.23 — ABNORMAL HIGH (ref 0.00–1.49)
Prothrombin Time: 23.7 seconds — ABNORMAL HIGH (ref 11.6–15.2)

## 2012-03-23 LAB — GLUCOSE, CAPILLARY: Glucose-Capillary: 120 mg/dL — ABNORMAL HIGH (ref 70–99)

## 2012-03-23 LAB — APTT: aPTT: 58 seconds — ABNORMAL HIGH (ref 24–37)

## 2012-03-23 LAB — TROPONIN I: Troponin I: <0.3 ng/mL (ref <0.30)

## 2012-03-23 NOTE — ED Notes (Signed)
Per ems- Pt has hx of cva with hx of right side deficit. Pt awoke this am, home health nurse noted pt eyes to not be focused, thought pt was not to norm. Pt became gradually weaker. Pt able to answer questions, slow to respond. Pt a&ox4. No new neuro deficits. No slurred speech/facial droop.  CBG-124 BP-148/90 HR-102 R-18

## 2012-03-23 NOTE — ED Provider Notes (Signed)
History     CSN: [id number]  Arrival date & time 03/23/12  1354   First MD Initiated Contact with Patient 03/23/12 1531      Chief Complaint  Patient presents with  . Weakness    (Consider location/radiation/quality/duration/timing/severity/associated sxs/prior treatment) HPI Comments: Patient with history of cva three years ago leaving her with right sided weakness and speech difficulty.  She woke this morning not feeling well.  Since then has been more weak and not acting her normal self according to home health nurse.    Patient is a 51 y.o. female presenting with weakness. The history is provided by the patient.  Weakness The primary symptoms include headaches. Primary symptoms do not include fever. Primary symptoms comment: Generalized weakness, headache Episode onset: this morning. The symptoms are unchanged. The neurological symptoms are diffuse.  The headache is associated with weakness.  Additional symptoms include weakness.    Past Medical History  Diagnosis Date  . THYROID NODULE, LEFT 04/10/2009  . DIABETES MELLITUS, TYPE II 08/21/2007  . HYPERLIPIDEMIA 08/21/2007  . GOUT 08/21/2007  . HYPERTENSION 08/21/2007    Dr. Andree Elk, [place] 08/21/2007  . CVA 04/17/2010  . CEREBROVASCULAR ACCIDENT, ACUTE 04/15/2010  . GERD 08/21/2007  . RENAL INSUFFICIENCY 08/21/2007  . LUPUS 08/21/2007  . OSTEOPOROSIS 08/21/2007    [facility]umat[facility] at [facility]  . DVT, HX OF 08/21/2007  . CLOSTRIDIUM DIFFICILE COLITIS, HX OF 08/21/2007  . KIDNEY TRANSPLANTATION, HX OF 08/22/2007    s/p renal transplant-Dr. Andree Elk, [facility]  . Pulmonary embolism 07/16/2010  . Renal failure   . Current use of long term anticoagulation     Dr. Andree Elk, [facility]  . Depression     Dr. Andree Elk, [facility]    Past Surgical History  Procedure Date  . Cholecystectomy   . Tubal ligation     Family History  Problem Relation Age of Onset  . Cancer Neg Hx   . Heart attack Mother   . Heart disease Father       History  Substance Use Topics  . Smoking status: Never Smoker   . Smokeless tobacco: Not on file  . Alcohol Use: No    OB History    Grav Para Term Preterm Abortions TAB SAB Ect Mult Living                  Review of Systems  Constitutional: Positive for fatigue. Negative for fever and chills.  Neurological: Positive for weakness and headaches.  All other systems reviewed and are negative.    Allergies  Oxycodone-acetaminophen; Propoxyphene-acetaminophen; Sulfonamide derivatives; Codeine; and Latex  Home Medications   Current Outpatient Rx  Name Route Sig Dispense Refill  . CALCITRIOL 0.25 MCG PO CAPS Oral Take 0.25 mcg by mouth daily.    Marland Kitchen DILTIAZEM HCL ER 180 MG PO CP24 Oral Take 180 mg by mouth daily.      Marland Kitchen ESOMEPRAZOLE MAGNESIUM 40 MG PO CPDR Oral Take 40 mg by mouth daily.      Marland Kitchen FOLIC ACID 1 MG PO TABS Oral Take 1 mg by mouth daily.      Marland Kitchen LOSARTAN POTASSIUM 50 MG PO TABS Oral Take 50 mg by mouth daily.    Marland Kitchen MYCOPHENOLATE MOFETIL 250 MG PO CAPS Oral Take 250 mg by mouth 2 (two) times daily.     Marland Kitchen PREDNISONE 1 MG PO TABS Oral Take 3 mg by mouth daily.    . SERTRALINE HCL 100 MG PO TABS Oral Take  100 mg by mouth daily.      Marland Kitchen SIMVASTATIN 20 MG PO TABS Oral Take 20 mg by mouth every evening.    Marland Kitchen SOLIFENACIN SUCCINATE 10 MG PO TABS Oral Take 10 mg by mouth daily.    Marland Kitchen TACROLIMUS 0.5 MG PO CAPS Oral Take 0.5 mg by mouth 2 (two) times daily.    . WARFARIN SODIUM 5 MG PO TABS Oral Take 2.5-5 mg by mouth daily. Take 1 tablet daily except on Saturday take 0.5 tablet    . GLIMEPIRIDE 1 MG PO TABS Oral Take 1 tablet (1 mg total) by mouth daily before breakfast. 30 tablet 11  . GLUCOSE BLOOD VI STRP  Use as instructed once daily dx 250.00      BP 146/93  Pulse 97  Temp 98.3 F (36.8 C) (Oral)  Resp 14  Ht 5\' 4"  (1.626 m)  Wt 180 lb (81.647 kg)  BMI 30.90 kg/m2  SpO2 100%  Physical Exam  Nursing note and vitals reviewed. Constitutional: She is oriented to  person, place, and time. She appears well-developed and well-nourished. No distress.  HENT:  Head: Normocephalic and atraumatic.  Mouth/Throat: Oropharynx is clear and moist.  Eyes: EOM are normal. Pupils are equal, round, and reactive to light.  Neck: Normal range of motion. Neck supple.  Cardiovascular: Normal rate and regular rhythm.   No murmur heard. Pulmonary/Chest: Effort normal and breath sounds normal. No respiratory distress. She has no wheezes.  Abdominal: Soft. Bowel sounds are normal. She exhibits no distension. There is no tenderness.  Musculoskeletal: Normal range of motion. She exhibits no edema.  Neurological: She is alert and oriented to person, place, and time.       There is a baseline right sided hemiparesis.  Speech is appropriate but slowed.    Skin: Skin is warm and dry. She is not diaphoretic.    ED Course  Procedures (including critical care time)  Labs Reviewed  PROTIME-INR - Abnormal; Notable for the following:    Prothrombin Time 23.7 (*)     INR 2.23 (*)     All other components within normal limits  APTT - Abnormal; Notable for the following:    aPTT 58 (*)     All other components within normal limits  COMPREHENSIVE METABOLIC PANEL - Abnormal; Notable for the following:    Potassium 3.1 (*)     Glucose, Bld 129 (*)     All other components within normal limits  POCT I-STAT, CHEM 8 - Abnormal; Notable for the following:    Potassium 3.2 (*)     Glucose, Bld 124 (*)     All other components within normal limits  CBC  DIFFERENTIAL  TROPONIN I  URINE RAPID DRUG SCREEN (HOSP PERFORMED)  URINALYSIS, ROUTINE W REFLEX MICROSCOPIC   Ct Head Wo Contrast  03/23/2012  *RADIOLOGY REPORT*  Clinical Data: Weakness.  CT HEAD WITHOUT CONTRAST  Technique:  Contiguous axial images were obtained from the base of the skull through the vertex without contrast.  Comparison: June 17, 2011.  Findings: Bony calvarium is intact. Left parietal and basal ganglia  encephalomalacia is noted consistent with old infarction.  No mass effect or midline shift is noted.  Dilatation of the left ventricle is noted and unchanged consistent with the previously described encephalomalacia.  There is no evidence of mass lesion, hemorrhage or acute infarction.  IMPRESSION: Left parietal and basal ganglia encephalomalacia secondary to old infarction.  No acute intracranial abnormality seen.   Original  Report Authenticated By: Dalene Carrow., M.D.      No diagnosis found.   Date: 03/23/2012  Rate: 97  Rhythm: normal sinus rhythm  QRS Axis: left   Intervals: normal  ST/T Wave abnormalities: normal  Conduction Disutrbances:none  Narrative Interpretation:   Old EKG Reviewed: unchanged    MDM  The patient presents here with weakness, not acting herself.  This started this morning.  The workup today does not reveal any acute abnormality, including ct, mri, and ua.  As I have found nothing emergent, I believe she is stable for discharge.  The family seems okay with this, she will be discharged to home.           Veryl Speak, MD 03/23/12 1902

## 2012-03-23 NOTE — ED Notes (Signed)
Pt weak on right side. Pt has drift in both arm and leg. Pt able to follow commands without difficulties. Pt states this is not her baseline.

## 2012-03-23 NOTE — ED Notes (Signed)
Pt d/c home in NAD. Pt voiced understanding of d/c instructions and follow up care. Pt instructed on reasons to return to ER.

## 2012-03-23 NOTE — ED Provider Notes (Signed)
MSE was initiated and I personally evaluated the patient and placed orders (if any) at  2:45pm on March 23, 2012.  The patient appears stable so that the remainder of the MSE may be completed by another provider.  Threasa Beards, MD 03/23/12 1520

## 2012-03-23 NOTE — ED Notes (Signed)
Pt c/o headache

## 2012-03-23 NOTE — ED Notes (Signed)
Md at bedside

## 2012-03-23 NOTE — ED Notes (Signed)
Pt states she woke up "not feeling like my normal self." Pt slow to speak. No facial droop noted, speech is minorly slurred, unknown baseline speech.

## 2012-03-28 ENCOUNTER — Ambulatory Visit: Payer: PRIVATE HEALTH INSURANCE | Admitting: Endocrinology

## 2012-03-29 ENCOUNTER — Ambulatory Visit (INDEPENDENT_AMBULATORY_CARE_PROVIDER_SITE_OTHER): Payer: PRIVATE HEALTH INSURANCE | Admitting: Endocrinology

## 2012-03-29 ENCOUNTER — Encounter: Payer: Self-pay | Admitting: Endocrinology

## 2012-03-29 VITALS — BP 132/84 | HR 95 | Temp 98.3°F | Resp 17 | Wt 181.6 lb

## 2012-03-29 DIAGNOSIS — E1129 Type 2 diabetes mellitus with other diabetic kidney complication: Secondary | ICD-10-CM

## 2012-03-29 DIAGNOSIS — E1165 Type 2 diabetes mellitus with hyperglycemia: Secondary | ICD-10-CM

## 2012-03-29 NOTE — Patient Instructions (Addendum)
Please come back for a regular physical after 04/12/12.   If your dizziness happens again, please call back.  In that case, you would need to see dr Leonie Man. We'll recheck your potassium when you return. it is critically important to prevent falling down (keep floor areas well-lit, dry, and free of loose objects.  If you have a cane, walker, or wheelchair, you should use it, even for short trips around the house.  Also, try not to rush)

## 2012-03-29 NOTE — Progress Notes (Signed)
Subjective:    Patient ID: Connie Ruiz, female    DOB: [birth year], 51 y.o.   MRN: [id number]  HPI The state of at least three ongoing medical problems is addressed today: Pt was seen in ER last week for dizziness.  Since then, sxs are resolved.   CVA: pt says her sxs are the same as when she last saw dr Leonie Man (right sided weakness).  She has a cane and walker, but uses neither. Hypokalemia was noted in the ER.  She denies n/v. Past Medical History  Diagnosis Date  . THYROID NODULE, LEFT 04/10/2009  . DIABETES MELLITUS, TYPE II 08/21/2007  . HYPERLIPIDEMIA 08/21/2007  . GOUT 08/21/2007  . HYPERTENSION 08/21/2007    Dr. Andree Elk, [place] 08/21/2007  . CVA 04/17/2010  . CEREBROVASCULAR ACCIDENT, ACUTE 04/15/2010  . GERD 08/21/2007  . RENAL INSUFFICIENCY 08/21/2007  . LUPUS 08/21/2007  . OSTEOPOROSIS 08/21/2007    Rheumatol at [facility]  . DVT, HX OF 08/21/2007  . CLOSTRIDIUM DIFFICILE COLITIS, HX OF 08/21/2007  . KIDNEY TRANSPLANTATION, HX OF 08/22/2007    s/p renal transplant-Dr. Andree Elk, [facility]  . Pulmonary embolism 07/16/2010  . Renal failure   . Current use of long term anticoagulation     Dr. Andree Elk, [facility]  . Depression     Dr. Andree Elk, [facility]    Past Surgical History  Procedure Date  . Cholecystectomy   . Tubal ligation     History   Social History  . Marital Status: Divorced    Spouse Name: N/A    Number of Children: 1  . Years of Education: N/A   Occupational History  . DISABILITY    Social History Main Topics  . Smoking status: Never Smoker   . Smokeless tobacco: Not on file  . Alcohol Use: No  . Drug Use: No  . Sexually Active:    Other Topics Concern  . Not on file   Social History Narrative   RetiredRegular exercise-no    Current Outpatient Prescriptions on File Prior to Visit  Medication Sig Dispense Refill  . calcitRIOL (ROCALTROL) 0.25 MCG capsule Take 0.25 mcg by mouth daily.      Marland Kitchen diltiazem (DILACOR XR) 180 MG 24 hr capsule  Take 180 mg by mouth daily.        Marland Kitchen esomeprazole (NEXIUM) 40 MG capsule Take 40 mg by mouth daily.        . folic acid (FOLVITE) 1 MG tablet Take 1 mg by mouth daily.        Marland Kitchen glucose blood (PRODIGY NO CODING BLOOD GLUC) test strip Use as instructed once daily dx 250.00      . losartan (COZAAR) 50 MG tablet Take 50 mg by mouth daily.      . mycophenolate (CELLCEPT) 250 MG capsule Take 250 mg by mouth 2 (two) times daily.       . predniSONE (DELTASONE) 1 MG tablet Take 3 mg by mouth daily.      . sertraline (ZOLOFT) 100 MG tablet Take 100 mg by mouth daily.        . simvastatin (ZOCOR) 20 MG tablet Take 20 mg by mouth every evening.      . solifenacin (VESICARE) 10 MG tablet Take 10 mg by mouth daily.      . tacrolimus (PROGRAF) 0.5 MG capsule Take 0.5 mg by mouth 2 (two) times daily.      Marland Kitchen warfarin (COUMADIN) 5 MG tablet Take 2.5-5 mg by mouth  daily. Take 1 tablet daily except on Saturday take 0.5 tablet      . glimepiride (AMARYL) 1 MG tablet Take 1 tablet (1 mg total) by mouth daily before breakfast.  30 tablet  11    Allergies  Allergen Reactions  . Oxycodone-Acetaminophen Shortness Of Breath and Nausea Only  . Propoxyphene-Acetaminophen Shortness Of Breath and Nausea Only  . Sulfonamide Derivatives Shortness Of Breath and Nausea Only  . Codeine     REACTION: nausea  . Latex     Family History  Problem Relation Age of Onset  . Cancer Neg Hx   . Heart attack Mother   . Heart disease Father     BP 132/84  Pulse 95  Temp 98.3 F (36.8 C) (Oral)  Resp 17  Wt 181 lb 9 oz (82.356 kg)  SpO2 97%  Review of Systems Denies LOC.  Denies bowel or bladder retention.      Objective:   Physical Exam VITAL SIGNS:  See vs page.   GENERAL: no distress.  Motor: slightly decreased strength throughout the right face and body.   Gait: slow and slightly unsteady.  She favors RLE Ext: 1+ bilat leg edema  Lab Results  Component Value Date   K 3.2* 03/23/2012      Assessment &  Plan:  Hypokalemia, uncertain etiology.  i hesitate to supplement, in view of her being on arb H/o CVA, with high fall risk Dizziness, better, uncertain etiology

## 2012-04-24 ENCOUNTER — Encounter (HOSPITAL_COMMUNITY): Payer: Self-pay | Admitting: *Deleted

## 2012-04-24 DIAGNOSIS — E785 Hyperlipidemia, unspecified: Secondary | ICD-10-CM | POA: Insufficient documentation

## 2012-04-24 DIAGNOSIS — E041 Nontoxic single thyroid nodule: Secondary | ICD-10-CM | POA: Insufficient documentation

## 2012-04-24 DIAGNOSIS — I2699 Other pulmonary embolism without acute cor pulmonale: Secondary | ICD-10-CM | POA: Insufficient documentation

## 2012-04-24 DIAGNOSIS — Z8673 Personal history of transient ischemic attack (TIA), and cerebral infarction without residual deficits: Secondary | ICD-10-CM | POA: Insufficient documentation

## 2012-04-24 DIAGNOSIS — M329 Systemic lupus erythematosus, unspecified: Secondary | ICD-10-CM | POA: Insufficient documentation

## 2012-04-24 DIAGNOSIS — Z8719 Personal history of other diseases of the digestive system: Secondary | ICD-10-CM | POA: Insufficient documentation

## 2012-04-24 DIAGNOSIS — F329 Major depressive disorder, single episode, unspecified: Secondary | ICD-10-CM | POA: Insufficient documentation

## 2012-04-24 DIAGNOSIS — IMO0002 Reserved for concepts with insufficient information to code with codable children: Secondary | ICD-10-CM | POA: Insufficient documentation

## 2012-04-24 DIAGNOSIS — R1310 Dysphagia, unspecified: Secondary | ICD-10-CM | POA: Insufficient documentation

## 2012-04-24 DIAGNOSIS — Z79899 Other long term (current) drug therapy: Secondary | ICD-10-CM | POA: Insufficient documentation

## 2012-04-24 DIAGNOSIS — M810 Age-related osteoporosis without current pathological fracture: Secondary | ICD-10-CM | POA: Insufficient documentation

## 2012-04-24 DIAGNOSIS — J029 Acute pharyngitis, unspecified: Secondary | ICD-10-CM | POA: Insufficient documentation

## 2012-04-24 DIAGNOSIS — E119 Type 2 diabetes mellitus without complications: Secondary | ICD-10-CM | POA: Insufficient documentation

## 2012-04-24 DIAGNOSIS — Z940 Kidney transplant status: Secondary | ICD-10-CM | POA: Insufficient documentation

## 2012-04-24 DIAGNOSIS — F3289 Other specified depressive episodes: Secondary | ICD-10-CM | POA: Insufficient documentation

## 2012-04-24 DIAGNOSIS — I82409 Acute embolism and thrombosis of unspecified deep veins of unspecified lower extremity: Secondary | ICD-10-CM | POA: Insufficient documentation

## 2012-04-24 DIAGNOSIS — K219 Gastro-esophageal reflux disease without esophagitis: Secondary | ICD-10-CM | POA: Insufficient documentation

## 2012-04-24 DIAGNOSIS — M109 Gout, unspecified: Secondary | ICD-10-CM | POA: Insufficient documentation

## 2012-04-24 DIAGNOSIS — N289 Disorder of kidney and ureter, unspecified: Secondary | ICD-10-CM | POA: Insufficient documentation

## 2012-04-24 DIAGNOSIS — Z7901 Long term (current) use of anticoagulants: Secondary | ICD-10-CM | POA: Insufficient documentation

## 2012-04-24 DIAGNOSIS — R51 Headache: Secondary | ICD-10-CM | POA: Insufficient documentation

## 2012-04-24 DIAGNOSIS — I10 Essential (primary) hypertension: Secondary | ICD-10-CM | POA: Insufficient documentation

## 2012-04-24 LAB — CBC WITH DIFFERENTIAL/PLATELET
Basophils Absolute: 0 10*3/uL (ref 0.0–0.1)
Basophils Relative: 0 % (ref 0–1)
Eosinophils Absolute: 0.1 10*3/uL (ref 0.0–0.7)
Eosinophils Relative: 2 % (ref 0–5)
HCT: 42.4 % (ref 36.0–46.0)
Hemoglobin: 14.6 g/dL (ref 12.0–15.0)
Lymphocytes Relative: 33 % (ref 12–46)
Lymphs Abs: 2.7 10*3/uL (ref 0.7–4.0)
MCH: 30.4 pg (ref 26.0–34.0)
MCHC: 34.4 g/dL (ref 30.0–36.0)
MCV: 88.1 fL (ref 78.0–100.0)
Monocytes Absolute: 0.9 10*3/uL (ref 0.1–1.0)
Monocytes Relative: 11 % (ref 3–12)
Neutro Abs: 4.4 10*3/uL (ref 1.7–7.7)
Neutrophils Relative %: 54 % (ref 43–77)
Platelets: 180 10*3/uL (ref 150–400)
RBC: 4.81 MIL/uL (ref 3.87–5.11)
RDW: 13 % (ref 11.5–15.5)
WBC: 8.1 10*3/uL (ref 4.0–10.5)

## 2012-04-24 LAB — PROTIME-INR
INR: 2.14 — ABNORMAL HIGH (ref 0.00–1.49)
Prothrombin Time: 23 seconds — ABNORMAL HIGH (ref 11.6–15.2)

## 2012-04-24 NOTE — ED Notes (Addendum)
C/o swollen R neck. (R neck and jaw swelling noted). Also sore throat. Onset yesterday. Throat difficult to visualize d/t pt effort. Appears minimally swollen, red and irritated without obvious exudate. States, "hurts to swallow". Also HA. (denies: cough, congestion, cold sx, fever, nvd, dizziness, earache, bleeding, problematic teeth or other sx). Alert, NAD, calm, interactive, skin W&D, resps e/u, speaking in clear complete sentences.

## 2012-04-25 ENCOUNTER — Emergency Department (HOSPITAL_COMMUNITY): Payer: PRIVATE HEALTH INSURANCE

## 2012-04-25 ENCOUNTER — Emergency Department (HOSPITAL_COMMUNITY)
Admission: EM | Admit: 2012-04-25 | Discharge: 2012-04-25 | Disposition: A | Discharge status: Home / Self Care | Payer: PRIVATE HEALTH INSURANCE | Attending: Emergency Medicine | Admitting: Emergency Medicine

## 2012-04-25 DIAGNOSIS — E041 Nontoxic single thyroid nodule: Secondary | ICD-10-CM

## 2012-04-25 LAB — BASIC METABOLIC PANEL
BUN: 11 mg/dL (ref 6–23)
CO2: 24 mEq/L (ref 19–32)
Calcium: 9.4 mg/dL (ref 8.4–10.5)
Chloride: 102 mEq/L (ref 96–112)
Creatinine, Ser: 0.6 mg/dL (ref 0.50–1.10)
GFR calc Af Amer: >90 mL/min (ref >90)
GFR calc non Af Amer: >90 mL/min (ref >90)
Glucose, Bld: 95 mg/dL (ref 70–99)
Potassium: 3.5 mEq/L (ref 3.5–5.1)
Sodium: 136 mEq/L (ref 135–145)

## 2012-04-25 LAB — RAPID STREP SCREEN (MED CTR MEBANE ONLY): Streptococcus, Group A Screen (Direct): NEGATIVE

## 2012-04-25 MED ORDER — HYDROCODONE-ACETAMINOPHEN 5-325 MG PO TABS: 1.0000 | ORAL_TABLET | ORAL | Status: DC | PRN

## 2012-04-25 NOTE — ED Provider Notes (Signed)
History     CSN: [id number]  Arrival date & time 04/24/12  2136   First MD Initiated Contact with Patient 04/25/12 0035      Chief Complaint  Patient presents with  . Oral Swelling    (Consider location/radiation/quality/duration/timing/severity/associated sxs/prior treatment) HPI Comments: Patient states she has a history of a thyroid nodule.  That is being monitored through a physician in [facility] with yearly ultrasounds.  She is due for the next ultrasound in February, but she noticed 2 days ago, that she was having increased difficulty swallowing, and pain with swallowing.  She did call her local physician, Dr. Loanne Drilling at 5:00 today, and was told to come to the emergency department for further evaluation.  The history is provided by the patient.    Past Medical History  Diagnosis Date  . THYROID NODULE, LEFT 04/10/2009  . DIABETES MELLITUS, TYPE II 08/21/2007  . HYPERLIPIDEMIA 08/21/2007  . GOUT 08/21/2007  . HYPERTENSION 08/21/2007    Dr. Andree Elk, [place] 08/21/2007  . CVA 04/17/2010  . CEREBROVASCULAR ACCIDENT, ACUTE 04/15/2010  . GERD 08/21/2007  . RENAL INSUFFICIENCY 08/21/2007  . LUPUS 08/21/2007  . OSTEOPOROSIS 08/21/2007    [facility]  . DVT, HX OF 08/21/2007  . CLOSTRIDIUM DIFFICILE COLITIS, HX OF 08/21/2007  . KIDNEY TRANSPLANTATION, HX OF 08/22/2007    s/p renal transplant-Dr. Andree Elk, [facility]  . Pulmonary embolism 07/16/2010  . Renal failure   . Current use of long term anticoagulation     Dr. Andree Elk, [facility]  . Depression     Dr. Andree Elk, [facility]    Past Surgical History  Procedure Date  . Cholecystectomy   . Tubal ligation     Family History  Problem Relation Age of Onset  . Cancer Neg Hx   . Heart attack Mother   . Heart disease Father     History  Substance Use Topics  . Smoking status: Never Smoker   . Smokeless tobacco: Not on file  . Alcohol Use: No    OB History    Grav Para Term Preterm Abortions TAB SAB  Ect Mult Living                  Review of Systems  Constitutional: Negative for fever and chills.  HENT: Positive for trouble swallowing. Negative for sore throat and rhinorrhea.   Respiratory: Negative for cough.   Gastrointestinal: Negative for nausea.  Musculoskeletal: Negative for myalgias.  Skin: Negative for wound.  Neurological: Negative for dizziness, weakness and numbness.    Allergies  Oxycodone-acetaminophen; Propoxyphene-acetaminophen; Sulfonamide derivatives; Codeine; and Latex  Home Medications   Current Outpatient Rx  Name  Route  Sig  Dispense  Refill  . CALCITRIOL 0.25 MCG PO CAPS   Oral   Take 0.25 mcg by mouth daily.         Marland Kitchen DILTIAZEM HCL ER 180 MG PO CP24   Oral   Take 180 mg by mouth daily.           Marland Kitchen ESOMEPRAZOLE MAGNESIUM 40 MG PO CPDR   Oral   Take 40 mg by mouth daily.           Marland Kitchen FOLIC ACID 1 MG PO TABS   Oral   Take 1 mg by mouth daily.           Marland Kitchen GLIMEPIRIDE 1 MG PO TABS   Oral   Take 1 tablet (1 mg total) by mouth daily before breakfast.  30 tablet   11   . GLUCOSE BLOOD VI STRP      Use as instructed once daily dx 250.00         . LOSARTAN POTASSIUM 50 MG PO TABS   Oral   Take 50 mg by mouth daily.         Marland Kitchen MYCOPHENOLATE MOFETIL 250 MG PO CAPS   Oral   Take 250 mg by mouth 2 (two) times daily.          Marland Kitchen PREDNISONE 1 MG PO TABS   Oral   Take 3 mg by mouth daily.         . SERTRALINE HCL 100 MG PO TABS   Oral   Take 100 mg by mouth daily.           Marland Kitchen SIMVASTATIN 20 MG PO TABS   Oral   Take 20 mg by mouth every evening.         Marland Kitchen SOLIFENACIN SUCCINATE 10 MG PO TABS   Oral   Take 10 mg by mouth daily.         Marland Kitchen TACROLIMUS 0.5 MG PO CAPS   Oral   Take 0.5 mg by mouth 2 (two) times daily.         . WARFARIN SODIUM 5 MG PO TABS   Oral   Take 2.5-5 mg by mouth daily. Take 1 tablet daily except on Saturday take 0.5 tablet         . HYDROCODONE-ACETAMINOPHEN 5-325 MG PO TABS   Oral    Take 1 tablet by mouth every 4 (four) hours as needed for pain.   10 tablet   0     BP 143/96  Pulse 97  Temp 98.7 F (37.1 C) (Oral)  Resp 18  SpO2 97%  Physical Exam  Constitutional: She appears well-developed and well-nourished.  HENT:  Head: Normocephalic.  Eyes: Pupils are equal, round, and reactive to light.  Neck: Normal range of motion.  Cardiovascular: Normal rate.   Pulmonary/Chest: Effort normal.  Musculoskeletal: Normal range of motion.  Neurological: She is alert.  Skin: Skin is warm.    ED Course  Procedures (including critical care time)  Labs Reviewed  PROTIME-INR - Abnormal; Notable for the following:    Prothrombin Time 23.0 (*)     INR 2.14 (*)     All other components within normal limits  BASIC METABOLIC PANEL  CBC WITH DIFFERENTIAL  RAPID STREP SCREEN   US Soft Tissue Head/neck  04/25/2012  *RADIOLOGY REPORT*  Clinical Data: Difficulty swallowing  THYROID ULTRASOUND  Technique: Ultrasound examination of the thyroid gland and adjacent soft tissues was performed.  Comparison:  04/16/2009  Findings:  Right thyroid lobe:  Measures 5.1 x 1.9 x 2.2 cm Left thyroid lobe:  Measures 5.4 x 2.4 x 2.1 cm Isthmus:  Measures 1.6 cm  Focal nodules:  Isthmus nodule measuring 2.0 x 1.4 x 2.5 cm is predominately solid, mildly enlarged from the 2010 prior.  Left lobe mixed cystic and solid nodule measuring 3.1 x 1.9 x 1.6 cm, mildly increased from the 2010 prior.  Lymphadenopathy:  None visualized.  IMPRESSION: The isthmus and left thyroid lobe nodules have mildly increased in size from 2010. Biopsy is recommended.  Overall, the gland is similar in size.   Original Report Authenticated By: Carlos Levering, M.D.      1. Thyroid nodule       MDM  Will obtain neck US  Thyroid nodule  slightly increased in size air way intacy      Garald Balding, NP 04/25/12 0257  Garald Balding, NP 04/25/12 [phone]

## 2012-04-25 NOTE — ED Provider Notes (Signed)
Medical screening examination/treatment/procedure(s) were performed by non-physician practitioner and as supervising physician I was immediately available for consultation/collaboration.  Aaron Edelman, MD 04/25/12 [phone]

## 2012-04-27 DIAGNOSIS — R509 Fever, unspecified: Secondary | ICD-10-CM | POA: Insufficient documentation

## 2012-04-28 ENCOUNTER — Encounter: Payer: PRIVATE HEALTH INSURANCE | Admitting: Endocrinology

## 2012-05-02 ENCOUNTER — Telehealth: Payer: Self-pay | Admitting: *Deleted

## 2012-05-02 NOTE — Telephone Encounter (Signed)
DAUGHTER OF PATIENT CALLED CONCERN OF HER MOTHER BEING DISCHARGED TODAY FROM [place] AFTER BEING ADMITTED AS INPATIENT FOR VIRUS SYMPTOMS. A CONCERN OF DAUGHTER IS HER MOTHER CAN NOT SWALLOW VERY GOOD BECAUSE OF THYROID NODULE HAS GOTTEN LARGER AND IS A PROBLEM NOW FOR HER. NEXT APPT. DATE WITH DR. Loanne Drilling IS 05/15/12. DAUGHTER OF PATIENT HAS STATED THIS TO THE NURSES THAT SHE DOES NOT WANT HER MOM BEING DISCHARGED TODAY. STATES THE NURSES HAVE CALLED DOCTOR THAT WROTE DISCHARGE ORDER TO RECOMMEND BEING DISCHARGED TODAY OR CONTINUE TO [place].Marland Kitchen

## 2012-05-05 DIAGNOSIS — R5381 Other malaise: Secondary | ICD-10-CM | POA: Insufficient documentation

## 2012-05-05 DIAGNOSIS — Z9181 History of falling: Secondary | ICD-10-CM | POA: Insufficient documentation

## 2012-05-15 ENCOUNTER — Encounter: Payer: PRIVATE HEALTH INSURANCE | Admitting: Endocrinology

## 2012-05-24 ENCOUNTER — Telehealth: Payer: Self-pay | Admitting: Endocrinology

## 2012-05-24 DIAGNOSIS — Z0279 Encounter for issue of other medical certificate: Secondary | ICD-10-CM

## 2012-05-24 NOTE — Telephone Encounter (Signed)
i printed 

## 2012-05-25 ENCOUNTER — Ambulatory Visit (INDEPENDENT_AMBULATORY_CARE_PROVIDER_SITE_OTHER): Payer: PRIVATE HEALTH INSURANCE

## 2012-05-25 DIAGNOSIS — I2699 Other pulmonary embolism without acute cor pulmonale: Secondary | ICD-10-CM

## 2012-05-25 DIAGNOSIS — I6350 Cerebral infarction due to unspecified occlusion or stenosis of unspecified cerebral artery: Secondary | ICD-10-CM

## 2012-05-25 LAB — POCT INR: INR: 4.3

## 2012-05-25 MED ORDER — WARFARIN SODIUM 5 MG PO TABS: ORAL_TABLET | ORAL | Status: DC

## 2012-05-25 NOTE — Telephone Encounter (Signed)
Patient notified of letter is ready to pick up at front desk at Dr Loanne Drilling office.

## 2012-06-08 DIAGNOSIS — E042 Nontoxic multinodular goiter: Secondary | ICD-10-CM | POA: Insufficient documentation

## 2012-06-09 ENCOUNTER — Ambulatory Visit (INDEPENDENT_AMBULATORY_CARE_PROVIDER_SITE_OTHER): Payer: PRIVATE HEALTH INSURANCE | Admitting: *Deleted

## 2012-06-09 DIAGNOSIS — I2699 Other pulmonary embolism without acute cor pulmonale: Secondary | ICD-10-CM

## 2012-06-09 DIAGNOSIS — I6350 Cerebral infarction due to unspecified occlusion or stenosis of unspecified cerebral artery: Secondary | ICD-10-CM

## 2012-06-09 LAB — POCT INR: INR: 2.4

## 2012-06-19 ENCOUNTER — Encounter: Payer: PRIVATE HEALTH INSURANCE | Admitting: Endocrinology

## 2012-06-23 ENCOUNTER — Ambulatory Visit (INDEPENDENT_AMBULATORY_CARE_PROVIDER_SITE_OTHER): Payer: PRIVATE HEALTH INSURANCE | Admitting: *Deleted

## 2012-06-23 DIAGNOSIS — I2699 Other pulmonary embolism without acute cor pulmonale: Secondary | ICD-10-CM

## 2012-06-23 DIAGNOSIS — I6350 Cerebral infarction due to unspecified occlusion or stenosis of unspecified cerebral artery: Secondary | ICD-10-CM

## 2012-06-23 LAB — POCT INR: INR: 2.6

## 2012-08-02 ENCOUNTER — Ambulatory Visit
Admission: RE | Admit: 2012-08-02 | Discharge: 2012-08-02 | Disposition: A | Discharge status: Home / Self Care | Payer: PRIVATE HEALTH INSURANCE | Source: Ambulatory Visit | Attending: Endocrinology | Admitting: Endocrinology

## 2012-08-02 ENCOUNTER — Ambulatory Visit (INDEPENDENT_AMBULATORY_CARE_PROVIDER_SITE_OTHER): Payer: PRIVATE HEALTH INSURANCE | Admitting: Endocrinology

## 2012-08-02 VITALS — BP 132/70 | HR 80 | Temp 97.5°F | Wt 176.0 lb

## 2012-08-02 DIAGNOSIS — R059 Cough, unspecified: Secondary | ICD-10-CM

## 2012-08-02 DIAGNOSIS — R05 Cough: Secondary | ICD-10-CM

## 2012-08-02 MED ORDER — CEFUROXIME AXETIL 250 MG PO TABS: 250.0000 mg | ORAL_TABLET | Freq: Two times a day (BID) | ORAL | Status: AC

## 2012-08-02 NOTE — Progress Notes (Signed)
Subjective:    Patient ID: Connie Ruiz, female    DOB: 02-21-1961, 52 y.o.   MRN: [id number]  HPI Pt states 3 days of moderate prod-quality cough in the chest, and assoc low-grade temp.   Past Medical History  Diagnosis Date  . THYROID NODULE, LEFT 04/10/2009  . DIABETES MELLITUS, TYPE II 08/21/2007  . HYPERLIPIDEMIA 08/21/2007  . GOUT 08/21/2007  . HYPERTENSION 08/21/2007    Dr. Andree Elk, [place] 08/21/2007  . CVA 04/17/2010  . CEREBROVASCULAR ACCIDENT, ACUTE 04/15/2010  . GERD 08/21/2007  . RENAL INSUFFICIENCY 08/21/2007  . LUPUS 08/21/2007  . OSTEOPOROSIS 08/21/2007    [facility] at [facility]  . DVT, HX OF 08/21/2007  . CLOSTRIDIUM DIFFICILE COLITIS, HX OF 08/21/2007  . KIDNEY TRANSPLANTATION, HX OF 08/22/2007    s/p renal transplant-Dr. Andree Elk, [facility]  . Pulmonary embolism 07/16/2010  . Renal failure   . Current use of long term anticoagulation     Dr. Andree Elk, [facility]  . Depression     Dr. Andree Elk, [facility]    Past Surgical History  Procedure Laterality Date  . Cholecystectomy    . Tubal ligation      History   Social History  . Marital Status: Divorced    Spouse Name: N/A    Number of Children: 1  . Years of Education: N/A   Occupational History  . DISABILITY    Social History Main Topics  . Smoking status: Never Smoker   . Smokeless tobacco: Not on file  . Alcohol Use: No  . Drug Use: No  . Sexually Active:    Other Topics Concern  . Not on file   Social History Narrative   Retired   Regular exercise-no    Current Outpatient Prescriptions on File Prior to Visit  Medication Sig Dispense Refill  . calcitRIOL (ROCALTROL) 0.25 MCG capsule Take 0.25 mcg by mouth daily.      Marland Kitchen diltiazem (DILACOR XR) 180 MG 24 hr capsule Take 180 mg by mouth daily.        Marland Kitchen esomeprazole (NEXIUM) 40 MG capsule Take 40 mg by mouth daily.        . folic acid (FOLVITE) 1 MG tablet Take 1 mg by mouth daily.        Marland Kitchen glucose blood (PRODIGY NO CODING BLOOD GLUC) test  strip Use as instructed once daily dx 250.00      . HYDROcodone-acetaminophen (NORCO/VICODIN) 5-325 MG per tablet Take 1 tablet by mouth every 4 (four) hours as needed for pain.  10 tablet  0  . losartan (COZAAR) 50 MG tablet Take 50 mg by mouth daily.      . mycophenolate (CELLCEPT) 250 MG capsule Take 250 mg by mouth 2 (two) times daily.       . predniSONE (DELTASONE) 1 MG tablet Take 3 mg by mouth daily.      . sertraline (ZOLOFT) 100 MG tablet Take 100 mg by mouth daily.        . simvastatin (ZOCOR) 20 MG tablet Take 20 mg by mouth every evening.      . solifenacin (VESICARE) 10 MG tablet Take 10 mg by mouth daily.      . tacrolimus (PROGRAF) 0.5 MG capsule Take 0.5 mg by mouth 2 (two) times daily.      Marland Kitchen warfarin (COUMADIN) 5 MG tablet Take as directed by anticoagulation clinic  30 tablet  3  . glimepiride (AMARYL) 1 MG tablet Take 1 tablet (1 mg  total) by mouth daily before breakfast.  30 tablet  11   No current facility-administered medications on file prior to visit.    Allergies  Allergen Reactions  . Oxycodone-Acetaminophen Shortness Of Breath and Nausea Only  . Propoxyphene-Acetaminophen Shortness Of Breath and Nausea Only  . Sulfonamide Derivatives Shortness Of Breath and Nausea Only  . Codeine     REACTION: nausea  . Latex     Family History  Problem Relation Age of Onset  . Cancer Neg Hx   . Heart attack Mother   . Heart disease Father     BP 132/70  Pulse 80  Temp(Src) 97.5 F (36.4 C) (Oral)  Wt 176 lb (79.833 kg)  BMI 30.2 kg/m2  SpO2 97%  Review of Systems She has nasal congestion and bilat otalgia    Objective:   Physical Exam VITAL SIGNS:  See vs page GENERAL: no distress head: no deformity eyes: no periorbital swelling, no proptosis external nose and ears are normal mouth: no lesion seen Both tm's are slightly red LUNGS:  Clear to auscultation     Assessment & Plan:  URI, new

## 2012-08-02 NOTE — Patient Instructions (Addendum)
A chest-x-ray is requested for you today.  We'll contact you with results. i have sent a prescription to your pharmacy, for an antibiotic pill. Loratadine-d (non-prescription) will help your congestion.   I hope you feel better soon.  If you don't feel better by next week, please call back.  Please call sooner if you get worse.

## 2012-08-18 ENCOUNTER — Ambulatory Visit (INDEPENDENT_AMBULATORY_CARE_PROVIDER_SITE_OTHER): Payer: PRIVATE HEALTH INSURANCE | Admitting: *Deleted

## 2012-08-18 DIAGNOSIS — I6350 Cerebral infarction due to unspecified occlusion or stenosis of unspecified cerebral artery: Secondary | ICD-10-CM

## 2012-08-18 DIAGNOSIS — I2699 Other pulmonary embolism without acute cor pulmonale: Secondary | ICD-10-CM

## 2012-08-18 LAB — POCT INR: INR: 2.2

## 2012-08-25 ENCOUNTER — Other Ambulatory Visit: Payer: Self-pay | Admitting: Endocrinology

## 2012-08-25 ENCOUNTER — Other Ambulatory Visit: Payer: Self-pay | Admitting: *Deleted

## 2012-08-25 MED ORDER — CALCITRIOL 0.25 MCG PO CAPS: 0.2500 ug | ORAL_CAPSULE | Freq: Every day | ORAL | Status: DC

## 2012-09-13 ENCOUNTER — Ambulatory Visit (INDEPENDENT_AMBULATORY_CARE_PROVIDER_SITE_OTHER): Payer: PRIVATE HEALTH INSURANCE | Admitting: Endocrinology

## 2012-09-13 ENCOUNTER — Encounter: Payer: Self-pay | Admitting: Endocrinology

## 2012-09-13 VITALS — BP 132/80 | HR 90 | Wt 179.0 lb

## 2012-09-13 DIAGNOSIS — J069 Acute upper respiratory infection, unspecified: Secondary | ICD-10-CM

## 2012-09-13 MED ORDER — DOXYCYCLINE HYCLATE 100 MG PO TABS: 100.0000 mg | ORAL_TABLET | Freq: Two times a day (BID) | ORAL | Status: DC

## 2012-09-13 NOTE — Progress Notes (Signed)
Subjective:    Patient ID: Connie Ruiz, female    DOB: 06/26/1960, 52 y.o.   MRN: [id number]  HPI Pt states few days of moderate pain at the throat, and assoc prod-quality cough.  She also has nasal congestion. Past Medical History  Diagnosis Date  . THYROID NODULE, LEFT 04/10/2009  . DIABETES MELLITUS, TYPE II 08/21/2007  . HYPERLIPIDEMIA 08/21/2007  . GOUT 08/21/2007  . HYPERTENSION 08/21/2007    Dr. Andree Elk, [place] 08/21/2007  . CVA 04/17/2010  . CEREBROVASCULAR ACCIDENT, ACUTE 04/15/2010  . GERD 08/21/2007  . RENAL INSUFFICIENCY 08/21/2007  . LUPUS 08/21/2007  . OSTEOPOROSIS 08/21/2007    [facility] at [facility]  . DVT, HX OF 08/21/2007  . CLOSTRIDIUM DIFFICILE COLITIS, HX OF 08/21/2007  . KIDNEY TRANSPLANTATION, HX OF 08/22/2007    s/p renal transplant-Dr. Andree Elk, [facility] [place]  . Pulmonary embolism 07/16/2010  . Renal failure   . Current use of long term anticoagulation     Dr. Andree Elk, [facility]  . Depression     Dr. Andree Elk, [facility]    Past Surgical History  Procedure Laterality Date  . Cholecystectomy    . Tubal ligation      History   Social History  . Marital Status: Divorced    Spouse Name: N/A    Number of Children: 1  . Years of Education: N/A   Occupational History  . DISABILITY    Social History Main Topics  . Smoking status: Never Smoker   . Smokeless tobacco: Not on file  . Alcohol Use: No  . Drug Use: No  . Sexually Active:    Other Topics Concern  . Not on file   Social History Narrative   Retired   Regular exercise-no    Current Outpatient Prescriptions on File Prior to Visit  Medication Sig Dispense Refill  . calcitRIOL (ROCALTROL) 0.25 MCG capsule Take 1 capsule (0.25 mcg total) by mouth daily.  30 capsule  1  . diltiazem (CARDIZEM CD) 180 MG 24 hr capsule 180 mg. Take 1 capsule (180 mg total) by mouth daily.      Marland Kitchen esomeprazole (NEXIUM) 40 MG capsule Take 40 mg by mouth daily.        . folic acid (FOLVITE) 1 MG tablet Take 1  mg by mouth daily.        Marland Kitchen glimepiride (AMARYL) 1 MG tablet 1 mg. Take 1 mg by mouth every morning before breakfast.      . glucose blood (PRODIGY NO CODING BLOOD GLUC) test strip Use as instructed once daily dx 250.00      . HYDROcodone-acetaminophen (NORCO/VICODIN) 5-325 MG per tablet Take 1 tablet by mouth every 4 (four) hours as needed for pain.  10 tablet  0  . losartan (COZAAR) 50 MG tablet Take 50 mg by mouth daily.      . mycophenolate (CELLCEPT) 250 MG capsule Take 250 mg by mouth 2 (two) times daily.       Marland Kitchen nystatin (MYCOSTATIN) powder Apply topically. 02/03/2006      . predniSONE (DELTASONE) 1 MG tablet Take 3 mg by mouth daily.      . sertraline (ZOLOFT) 100 MG tablet Take 100 mg by mouth daily.        . simvastatin (ZOCOR) 20 MG tablet Take 20 mg by mouth every evening.      . solifenacin (VESICARE) 10 MG tablet Take 10 mg by mouth daily.      . tacrolimus (PROGRAF) 0.5 MG  capsule Take 0.5 mg by mouth 2 (two) times daily.      Marland Kitchen warfarin (COUMADIN) 5 MG tablet Take as directed by anticoagulation clinic  30 tablet  3   No current facility-administered medications on file prior to visit.    Allergies  Allergen Reactions  . Oxycodone-Acetaminophen Shortness Of Breath and Nausea Only  . Propoxyphene-Acetaminophen Shortness Of Breath and Nausea Only  . Sulfonamide Derivatives Shortness Of Breath and Nausea Only  . Codeine     REACTION: nausea  . Latex     Family History  Problem Relation Age of Onset  . Cancer Neg Hx   . Heart attack Mother   . Heart disease Father     BP 132/80  Pulse 90  Wt 179 lb (81.194 kg)  BMI 30.71 kg/m2  SpO2 96%    Review of Systems Denies SOB and fever    Objective:   Physical Exam VITAL SIGNS:  See vs page GENERAL: no distress head: no deformity eyes: no periorbital swelling; bilateral proptosis external nose and ears are normal mouth: no lesion seen left tm is normal, but the right is slightly red LUNGS:  Clear to  auscultation        Assessment & Plan:

## 2012-09-13 NOTE — Patient Instructions (Addendum)
i have sent a prescription to your pharmacy, for an antibiotic pill Loratadine-d (non-prescription) will help your congestion. Please come in soon for a regular physical.

## 2012-09-17 ENCOUNTER — Other Ambulatory Visit: Payer: Self-pay | Admitting: Cardiovascular Disease

## 2012-09-21 ENCOUNTER — Ambulatory Visit (INDEPENDENT_AMBULATORY_CARE_PROVIDER_SITE_OTHER): Payer: PRIVATE HEALTH INSURANCE

## 2012-09-21 DIAGNOSIS — I6350 Cerebral infarction due to unspecified occlusion or stenosis of unspecified cerebral artery: Secondary | ICD-10-CM

## 2012-09-21 DIAGNOSIS — I2699 Other pulmonary embolism without acute cor pulmonale: Secondary | ICD-10-CM

## 2012-09-21 LAB — POCT INR: INR: 1.6

## 2012-10-04 ENCOUNTER — Encounter: Payer: Self-pay | Admitting: Endocrinology

## 2012-10-04 ENCOUNTER — Ambulatory Visit (INDEPENDENT_AMBULATORY_CARE_PROVIDER_SITE_OTHER): Payer: PRIVATE HEALTH INSURANCE | Admitting: Endocrinology

## 2012-10-04 VITALS — BP 126/80 | HR 90 | Wt 181.0 lb

## 2012-10-04 DIAGNOSIS — F329 Major depressive disorder, single episode, unspecified: Secondary | ICD-10-CM

## 2012-10-04 DIAGNOSIS — Z79899 Other long term (current) drug therapy: Secondary | ICD-10-CM

## 2012-10-04 DIAGNOSIS — E1129 Type 2 diabetes mellitus with other diabetic kidney complication: Secondary | ICD-10-CM

## 2012-10-04 DIAGNOSIS — I509 Heart failure, unspecified: Secondary | ICD-10-CM

## 2012-10-04 DIAGNOSIS — E1165 Type 2 diabetes mellitus with hyperglycemia: Secondary | ICD-10-CM

## 2012-10-04 DIAGNOSIS — N259 Disorder resulting from impaired renal tubular function, unspecified: Secondary | ICD-10-CM

## 2012-10-04 DIAGNOSIS — I6350 Cerebral infarction due to unspecified occlusion or stenosis of unspecified cerebral artery: Secondary | ICD-10-CM

## 2012-10-04 DIAGNOSIS — Z0000 Encounter for general adult medical examination without abnormal findings: Secondary | ICD-10-CM

## 2012-10-04 DIAGNOSIS — I10 Essential (primary) hypertension: Secondary | ICD-10-CM

## 2012-10-04 DIAGNOSIS — E785 Hyperlipidemia, unspecified: Secondary | ICD-10-CM

## 2012-10-04 DIAGNOSIS — M810 Age-related osteoporosis without current pathological fracture: Secondary | ICD-10-CM

## 2012-10-04 DIAGNOSIS — M109 Gout, unspecified: Secondary | ICD-10-CM

## 2012-10-04 NOTE — Progress Notes (Signed)
Subjective:    Patient ID: Connie Ruiz, female    DOB: 01-Jul-1960, 52 y.o.   MRN: [id number]  HPI here for regular wellness examination.  she's feeling pretty well in general, and says chronic med probs are stable, except as noted below Past Medical History  Diagnosis Date  . THYROID NODULE, LEFT 04/10/2009  . DIABETES MELLITUS, TYPE II 08/21/2007  . HYPERLIPIDEMIA 08/21/2007  . GOUT 08/21/2007  . HYPERTENSION 08/21/2007    Dr. Andree Elk, [place] 08/21/2007  . CVA 04/17/2010  . CEREBROVASCULAR ACCIDENT, ACUTE 04/15/2010  . GERD 08/21/2007  . RENAL INSUFFICIENCY 08/21/2007  . LUPUS 08/21/2007  . OSTEOPOROSIS 08/21/2007    [facility] at [facility]  . DVT, HX OF 08/21/2007  . CLOSTRIDIUM DIFFICILE COLITIS, HX OF 08/21/2007  . KIDNEY TRANSPLANTATION, HX OF 08/22/2007    s/p renal transplant-Dr. Andree Elk, [facility]  . Pulmonary embolism 07/16/2010  . Renal failure   . Current use of long term anticoagulation     Dr. Andree Elk, [facility]  . Depression     Dr. Andree Elk, [facility]    Past Surgical History  Procedure Laterality Date  . Cholecystectomy    . Tubal ligation      History   Social History  . Marital Status: Divorced    Spouse Name: N/A    Number of Children: 1  . Years of Education: N/A   Occupational History  . DISABILITY    Social History Main Topics  . Smoking status: Never Smoker   . Smokeless tobacco: Not on file  . Alcohol Use: No  . Drug Use: No  . Sexually Active:    Other Topics Concern  . Not on file   Social History Narrative   Retired   Regular exercise-no    Current Outpatient Prescriptions on File Prior to Visit  Medication Sig Dispense Refill  . calcitRIOL (ROCALTROL) 0.25 MCG capsule Take 1 capsule (0.25 mcg total) by mouth daily.  30 capsule  1  . diltiazem (CARDIZEM CD) 180 MG 24 hr capsule 180 mg. Take 1 capsule (180 mg total) by mouth daily.      Marland Kitchen doxycycline (VIBRA-TABS) 100 MG tablet Take 1 tablet (100 mg total) by mouth 2 (two)  times daily.  14 tablet  0  . esomeprazole (NEXIUM) 40 MG capsule Take 40 mg by mouth daily.        . folic acid (FOLVITE) 1 MG tablet Take 1 mg by mouth daily.        Marland Kitchen glimepiride (AMARYL) 1 MG tablet 1 mg. Take 1 mg by mouth every morning before breakfast.      . glucose blood (PRODIGY NO CODING BLOOD GLUC) test strip Use as instructed once daily dx 250.00      . HYDROcodone-acetaminophen (NORCO/VICODIN) 5-325 MG per tablet Take 1 tablet by mouth every 4 (four) hours as needed for pain.  10 tablet  0  . losartan (COZAAR) 50 MG tablet Take 50 mg by mouth daily.      . mycophenolate (CELLCEPT) 250 MG capsule Take 250 mg by mouth 2 (two) times daily.       Marland Kitchen nystatin (MYCOSTATIN) powder Apply topically. 02/03/2006      . predniSONE (DELTASONE) 1 MG tablet Take 3 mg by mouth daily.      . sertraline (ZOLOFT) 100 MG tablet Take 100 mg by mouth daily.        . simvastatin (ZOCOR) 20 MG tablet Take 20 mg by mouth every evening.      Marland Kitchen  solifenacin (VESICARE) 10 MG tablet Take 10 mg by mouth daily.      . tacrolimus (PROGRAF) 0.5 MG capsule Take 0.5 mg by mouth 2 (two) times daily.      Marland Kitchen warfarin (COUMADIN) 5 MG tablet TAKE AS DIRECTED BY ANTICOAGULATION CLINIC  30 tablet  1   No current facility-administered medications on file prior to visit.    Allergies  Allergen Reactions  . Oxycodone-Acetaminophen Shortness Of Breath and Nausea Only  . Propoxyphene-Acetaminophen Shortness Of Breath and Nausea Only  . Sulfonamide Derivatives Shortness Of Breath and Nausea Only  . Codeine     REACTION: nausea  . Latex     Family History  Problem Relation Age of Onset  . Cancer Neg Hx   . Heart attack Mother   . Heart disease Father     BP 126/80  Pulse 90  Wt 181 lb (82.101 kg)  BMI 31.05 kg/m2  SpO2 93%     Review of Systems  Constitutional: Negative for fever.       Weight gain  HENT: Negative for hearing loss.   Eyes: Negative for visual disturbance.  Respiratory: Negative for  shortness of breath.   Cardiovascular: Negative for chest pain.  Gastrointestinal: Negative for anal bleeding.  Endocrine: Negative for cold intolerance.  Genitourinary: Negative for hematuria.  Musculoskeletal: Negative for back pain.  Skin: Negative for rash.  Allergic/Immunologic: Negative for environmental allergies.  Neurological: Negative for syncope.  Hematological: Bruises/bleeds easily.  Psychiatric/Behavioral: Positive for dysphoric mood.      Objective:   Physical Exam VS: see vs page GEN: no distress HEAD: head: no deformity eyes: no periorbital swelling; there is bilat proptosis external nose and ears are normal mouth: no lesion seen NECK: large multinodular goiter CHEST WALL: no deformity LUNGS:  Clear to auscultation BREASTS:  sees gyn CV: reg rate and rhythm; soft systolic murmur ABD: abdomen is soft, nontender.  no hepatosplenomegaly.  not distended.  no hernia GENITALIA:  sees gyn RECTAL: sees gyn MUSCULOSKELETAL: muscle bulk and strength are grossly normal.  no obvious joint swelling.  gait is normal and steady. PULSES: no carotid bruit NEURO:  cn 2-12 grossly intact, except for expressive aphasia.   readily moves all 4's.   SKIN:  Normal texture and temperature.  No rash or suspicious lesion is visible.   NODES:  None palpable at the neck PSYCH: alert, oriented x3.  Does not appear anxious nor depressed.      Assessment & Plan:  Wellness visit today, with problems stable, except as noted.  we discussed code status.  pt requests full code.

## 2012-10-04 NOTE — Patient Instructions (Addendum)
please consider these measures for your health:  minimize alcohol.  do not use tobacco products.  have a colonoscopy at least every 10 years from age 52.  Women should have an annual mammogram from age 6.  keep firearms safely stored.  always use seat belts.  have working smoke alarms in your home.  see an eye doctor and dentist regularly.  never drive under the influence of alcohol or drugs (including prescription drugs).   Please come back for a follow-up appointment in 6 months.   blood tests are being requested for you today.  We'll contact you with results.   You should have a vaccine against shingles (a painful rash which results from the  chickenpox infection which most people had many years ago).  This vaccine reduces, but does not totally eliminate the risk of shingles.  Because this is a medicare part d benefit, you should get it at a pharmacy.

## 2012-10-05 ENCOUNTER — Ambulatory Visit (INDEPENDENT_AMBULATORY_CARE_PROVIDER_SITE_OTHER): Payer: PRIVATE HEALTH INSURANCE | Admitting: *Deleted

## 2012-10-05 DIAGNOSIS — I2699 Other pulmonary embolism without acute cor pulmonale: Secondary | ICD-10-CM

## 2012-10-05 DIAGNOSIS — F32A Depression, unspecified: Secondary | ICD-10-CM | POA: Insufficient documentation

## 2012-10-05 DIAGNOSIS — I6350 Cerebral infarction due to unspecified occlusion or stenosis of unspecified cerebral artery: Secondary | ICD-10-CM

## 2012-10-05 DIAGNOSIS — F329 Major depressive disorder, single episode, unspecified: Secondary | ICD-10-CM | POA: Insufficient documentation

## 2012-10-05 LAB — POCT INR: INR: 1.7

## 2012-10-18 ENCOUNTER — Ambulatory Visit (INDEPENDENT_AMBULATORY_CARE_PROVIDER_SITE_OTHER): Payer: PRIVATE HEALTH INSURANCE | Admitting: Endocrinology

## 2012-10-18 ENCOUNTER — Encounter: Payer: Self-pay | Admitting: Endocrinology

## 2012-10-18 VITALS — BP 126/80 | HR 76 | Ht 65.0 in | Wt 177.0 lb

## 2012-10-18 DIAGNOSIS — S8000XA Contusion of unspecified knee, initial encounter: Secondary | ICD-10-CM

## 2012-10-18 DIAGNOSIS — S8002XA Contusion of left knee, initial encounter: Secondary | ICD-10-CM

## 2012-10-18 NOTE — Patient Instructions (Signed)
Please do an x-ray on you way out today.  We'll contact you with results.   Refer to an orthopedic specialist.  you will receive a phone call, about a day and time for an appointment.

## 2012-10-18 NOTE — Progress Notes (Signed)
Subjective:    Patient ID: Connie Ruiz, female    DOB: 1960/12/05, 52 y.o.   MRN: [id number]  HPI 2 weeks ago, pt fell and twisted her left knee.  As she fell, she struck her left knee on the driveway.  Since then, she has moderate pain there, but no assoc numbness.   Past Medical History  Diagnosis Date  . THYROID NODULE, LEFT 04/10/2009  . DIABETES MELLITUS, TYPE II 08/21/2007  . HYPERLIPIDEMIA 08/21/2007  . GOUT 08/21/2007  . HYPERTENSION 08/21/2007    Dr. Andree Elk, [place] 08/21/2007  . CVA 04/17/2010  . CEREBROVASCULAR ACCIDENT, ACUTE 04/15/2010  . GERD 08/21/2007  . RENAL INSUFFICIENCY 08/21/2007  . LUPUS 08/21/2007  . OSTEOPOROSIS 08/21/2007    Rheumatol at [facility]  . DVT, HX OF 08/21/2007  . CLOSTRIDIUM DIFFICILE COLITIS, HX OF 08/21/2007  . KIDNEY TRANSPLANTATION, HX OF 08/22/2007    s/p renal transplant-Dr. Andree Elk, [facility]  . Pulmonary embolism 07/16/2010  . Renal failure   . Current use of long term anticoagulation     Dr. Andree Elk, [facility]  . Depression     Dr. Andree Elk, [facility]    Past Surgical History  Procedure Laterality Date  . Cholecystectomy    . Tubal ligation      History   Social History  . Marital Status: Divorced    Spouse Name: N/A    Number of Children: 1  . Years of Education: N/A   Occupational History  . DISABILITY    Social History Main Topics  . Smoking status: Never Smoker   . Smokeless tobacco: Not on file  . Alcohol Use: No  . Drug Use: No  . Sexually Active:    Other Topics Concern  . Not on file   Social History Narrative   Retired   Regular exercise-no    Current Outpatient Prescriptions on File Prior to Visit  Medication Sig Dispense Refill  . calcitRIOL (ROCALTROL) 0.25 MCG capsule Take 1 capsule (0.25 mcg total) by mouth daily.  30 capsule  1  . diltiazem (CARDIZEM CD) 180 MG 24 hr capsule 180 mg. Take 1 capsule (180 mg total) by mouth daily.      Marland Kitchen doxycycline (VIBRA-TABS) 100 MG tablet Take 1 tablet  (100 mg total) by mouth 2 (two) times daily.  14 tablet  0  . esomeprazole (NEXIUM) 40 MG capsule Take 40 mg by mouth daily.        . folic acid (FOLVITE) 1 MG tablet Take 1 mg by mouth daily.        Marland Kitchen glimepiride (AMARYL) 1 MG tablet 1 mg. Take 1 mg by mouth every morning before breakfast.      . glucose blood (PRODIGY NO CODING BLOOD GLUC) test strip Use as instructed once daily dx 250.00      . HYDROcodone-acetaminophen (NORCO/VICODIN) 5-325 MG per tablet Take 1 tablet by mouth every 4 (four) hours as needed for pain.  10 tablet  0  . losartan (COZAAR) 50 MG tablet Take 50 mg by mouth daily.      . mycophenolate (CELLCEPT) 250 MG capsule Take 250 mg by mouth 2 (two) times daily.       Marland Kitchen nystatin (MYCOSTATIN) powder Apply topically. 02/03/2006      . predniSONE (DELTASONE) 1 MG tablet Take 3 mg by mouth daily.      . sertraline (ZOLOFT) 100 MG tablet Take 100 mg by mouth daily.        Marland Kitchen  simvastatin (ZOCOR) 20 MG tablet Take 20 mg by mouth every evening.      . solifenacin (VESICARE) 10 MG tablet Take 10 mg by mouth daily.      . tacrolimus (PROGRAF) 0.5 MG capsule Take 0.5 mg by mouth 2 (two) times daily.      Marland Kitchen warfarin (COUMADIN) 5 MG tablet TAKE AS DIRECTED BY ANTICOAGULATION CLINIC  30 tablet  1   No current facility-administered medications on file prior to visit.    Allergies  Allergen Reactions  . Oxycodone-Acetaminophen Shortness Of Breath and Nausea Only  . Propoxyphene-Acetaminophen Shortness Of Breath and Nausea Only  . Sulfonamide Derivatives Shortness Of Breath and Nausea Only  . Codeine     REACTION: nausea  . Latex     Family History  Problem Relation Age of Onset  . Cancer Neg Hx   . Heart attack Mother   . Heart disease Father    BP 126/80  Pulse 76  Ht 5\' 5"  (1.651 m)  Wt 177 lb (80.287 kg)  BMI 29.45 kg/m2  SpO2 98%  Review of Systems Denies LOC and bleeding    Objective:   Physical Exam VITAL SIGNS:  See vs page GENERAL: no distress Left knee:  slight swelling and anterior tenderness.  No erythema or laceration.  Gait: unable to bear weight on the LLE.       Assessment & Plan:  Knee contusion, new

## 2012-10-20 ENCOUNTER — Ambulatory Visit (INDEPENDENT_AMBULATORY_CARE_PROVIDER_SITE_OTHER): Payer: PRIVATE HEALTH INSURANCE

## 2012-10-20 DIAGNOSIS — I6350 Cerebral infarction due to unspecified occlusion or stenosis of unspecified cerebral artery: Secondary | ICD-10-CM

## 2012-10-20 DIAGNOSIS — I2699 Other pulmonary embolism without acute cor pulmonale: Secondary | ICD-10-CM

## 2012-10-20 LAB — POCT INR: INR: 1.5

## 2012-10-25 ENCOUNTER — Other Ambulatory Visit: Payer: Self-pay | Admitting: *Deleted

## 2012-10-25 ENCOUNTER — Other Ambulatory Visit: Payer: Self-pay | Admitting: Endocrinology

## 2012-10-25 MED ORDER — CALCITRIOL 0.25 MCG PO CAPS: 0.2500 ug | ORAL_CAPSULE | Freq: Every day | ORAL | Status: DC

## 2012-10-27 ENCOUNTER — Ambulatory Visit (INDEPENDENT_AMBULATORY_CARE_PROVIDER_SITE_OTHER): Payer: PRIVATE HEALTH INSURANCE | Admitting: Endocrinology

## 2012-10-27 ENCOUNTER — Encounter: Payer: Self-pay | Admitting: Endocrinology

## 2012-10-27 ENCOUNTER — Ambulatory Visit
Admission: RE | Admit: 2012-10-27 | Discharge: 2012-10-27 | Disposition: A | Discharge status: Home / Self Care | Payer: PRIVATE HEALTH INSURANCE | Source: Ambulatory Visit | Attending: Endocrinology | Admitting: Endocrinology

## 2012-10-27 VITALS — BP 130/70 | HR 70 | Temp 97.8°F | Ht 64.0 in | Wt 177.0 lb

## 2012-10-27 DIAGNOSIS — R05 Cough: Secondary | ICD-10-CM

## 2012-10-27 DIAGNOSIS — J069 Acute upper respiratory infection, unspecified: Secondary | ICD-10-CM

## 2012-10-27 DIAGNOSIS — R059 Cough, unspecified: Secondary | ICD-10-CM

## 2012-10-27 MED ORDER — CEFUROXIME AXETIL 250 MG PO TABS: 250.0000 mg | ORAL_TABLET | Freq: Two times a day (BID) | ORAL | Status: AC

## 2012-10-27 NOTE — Progress Notes (Signed)
Subjective:    Patient ID: Connie Ruiz, female    DOB: 07/15/1960, 52 y.o.   MRN: [id number]  HPI Pt states 5 days of moderate pain at the throat, and assoc prod-quality cough.  No earache.   Past Medical History  Diagnosis Date  . THYROID NODULE, LEFT 04/10/2009  . DIABETES MELLITUS, TYPE II 08/21/2007  . HYPERLIPIDEMIA 08/21/2007  . GOUT 08/21/2007  . HYPERTENSION 08/21/2007    Dr. Andree Elk, [place] 08/21/2007  . CVA 04/17/2010  . CEREBROVASCULAR ACCIDENT, ACUTE 04/15/2010  . GERD 08/21/2007  . RENAL INSUFFICIENCY 08/21/2007  . LUPUS 08/21/2007  . OSTEOPOROSIS 08/21/2007    Rheumatol at [facility]  . DVT, HX OF 08/21/2007  . CLOSTRIDIUM DIFFICILE COLITIS, HX OF 08/21/2007  . KIDNEY TRANSPLANTATION, HX OF 08/22/2007    s/p renal transplant-Dr. Andree Elk, [facility]  . Pulmonary embolism 07/16/2010  . Renal failure   . Current use of long term anticoagulation     Dr. Andree Elk, [facility]  . Depression     Dr. Andree Elk, [facility]    Past Surgical History  Procedure Laterality Date  . Cholecystectomy    . Tubal ligation      History   Social History  . Marital Status: Divorced    Spouse Name: N/A    Number of Children: 1  . Years of Education: N/A   Occupational History  . DISABILITY    Social History Main Topics  . Smoking status: Never Smoker   . Smokeless tobacco: Not on file  . Alcohol Use: No  . Drug Use: No  . Sexually Active:    Other Topics Concern  . Not on file   Social History Narrative   Retired   Regular exercise-no    Current Outpatient Prescriptions on File Prior to Visit  Medication Sig Dispense Refill  . calcitRIOL (ROCALTROL) 0.25 MCG capsule TAKE 1 CAPSULE BY MOUTH EVERY DAY  30 capsule  0  . calcitRIOL (ROCALTROL) 0.25 MCG capsule Take 1 capsule (0.25 mcg total) by mouth daily.  30 capsule  3  . diltiazem (CARDIZEM CD) 180 MG 24 hr capsule 180 mg. Take 1 capsule (180 mg total) by mouth daily.      Marland Kitchen doxycycline (VIBRA-TABS) 100 MG tablet  Take 1 tablet (100 mg total) by mouth 2 (two) times daily.  14 tablet  0  . esomeprazole (NEXIUM) 20 MG capsule 20 mg. Take 1 capsule (20 mg total) by mouth every morning before breakfast.      . esomeprazole (NEXIUM) 40 MG capsule Take 40 mg by mouth daily.        . folic acid (FOLVITE) 1 MG tablet Take 1 mg by mouth daily.        Marland Kitchen glimepiride (AMARYL) 1 MG tablet 1 mg. Take 1 mg by mouth every morning before breakfast.      . glucose blood (PRODIGY NO CODING BLOOD GLUC) test strip Use as instructed once daily dx 250.00      . HYDROcodone-acetaminophen (NORCO/VICODIN) 5-325 MG per tablet Take 1 tablet by mouth every 4 (four) hours as needed for pain.  10 tablet  0  . losartan (COZAAR) 50 MG tablet Take 50 mg by mouth daily.      . mycophenolate (CELLCEPT) 250 MG capsule Take 250 mg by mouth 2 (two) times daily.       Marland Kitchen nystatin (MYCOSTATIN) powder Apply topically. 02/03/2006      . predniSONE (DELTASONE) 1 MG tablet Take 3 mg  by mouth daily.      . sertraline (ZOLOFT) 100 MG tablet Take 100 mg by mouth daily.        . simvastatin (ZOCOR) 20 MG tablet Take 20 mg by mouth every evening.      . solifenacin (VESICARE) 10 MG tablet Take 10 mg by mouth daily.      . tacrolimus (PROGRAF) 0.5 MG capsule Take 0.5 mg by mouth 2 (two) times daily.      . tacrolimus (PROGRAF) 1 MG capsule 1 mg. Take 1 capsule (1 mg total) by mouth 2 times daily.      Marland Kitchen warfarin (COUMADIN) 5 MG tablet TAKE AS DIRECTED BY ANTICOAGULATION CLINIC  30 tablet  1   No current facility-administered medications on file prior to visit.    Allergies  Allergen Reactions  . Oxycodone-Acetaminophen Shortness Of Breath and Nausea Only  . Propoxyphene-Acetaminophen Shortness Of Breath and Nausea Only  . Sulfonamide Derivatives Shortness Of Breath and Nausea Only  . Codeine     REACTION: nausea  . Latex     Family History  Problem Relation Age of Onset  . Cancer Neg Hx   . Heart attack Mother   . Heart disease Father      BP 130/70  Pulse 70  Temp(Src) 97.8 F (36.6 C)  Ht 5\' 4"  (1.626 m)  Wt 177 lb (80.287 kg)  BMI 30.37 kg/m2  SpO2 98%    Review of Systems Denies fever and sob.      Objective:   Physical Exam VITAL SIGNS:  See vs page GENERAL: no distress head: no deformity eyes: no periorbital swelling, no proptosis external nose and ears are normal mouth: uvula is red and swollen Both eac's and tm's are normal LUNGS:  Clear to auscultation    CXR: NAD    Assessment & Plan:  URI, new S/p renal transplant.  In view of this, ceftin is the best of the available options.

## 2012-10-27 NOTE — Patient Instructions (Addendum)
A chest-x-ray is requested for you today.  We'll contact you with results. Loratadine-d (non-prescription) will help your congestion. i have sent a prescription to your pharmacy, for an antibiotic pill. I hope you feel better soon.  If you don't feel better by next week, please call back.  Please call sooner if you get worse.

## 2012-11-15 ENCOUNTER — Ambulatory Visit (INDEPENDENT_AMBULATORY_CARE_PROVIDER_SITE_OTHER): Payer: PRIVATE HEALTH INSURANCE | Admitting: *Deleted

## 2012-11-15 DIAGNOSIS — I6350 Cerebral infarction due to unspecified occlusion or stenosis of unspecified cerebral artery: Secondary | ICD-10-CM

## 2012-11-15 DIAGNOSIS — I2699 Other pulmonary embolism without acute cor pulmonale: Secondary | ICD-10-CM

## 2012-11-15 LAB — POCT INR: INR: 1.5

## 2012-11-24 ENCOUNTER — Encounter: Payer: PRIVATE HEALTH INSURANCE | Admitting: Endocrinology

## 2013-01-04 ENCOUNTER — Ambulatory Visit (INDEPENDENT_AMBULATORY_CARE_PROVIDER_SITE_OTHER): Payer: PRIVATE HEALTH INSURANCE | Admitting: *Deleted

## 2013-01-04 ENCOUNTER — Other Ambulatory Visit: Payer: Self-pay | Admitting: *Deleted

## 2013-01-04 DIAGNOSIS — I2699 Other pulmonary embolism without acute cor pulmonale: Secondary | ICD-10-CM

## 2013-01-04 DIAGNOSIS — I6350 Cerebral infarction due to unspecified occlusion or stenosis of unspecified cerebral artery: Secondary | ICD-10-CM

## 2013-01-04 LAB — POCT INR: INR: 5.6

## 2013-01-04 MED ORDER — WARFARIN SODIUM 5 MG PO TABS: 5.0000 mg | ORAL_TABLET | ORAL | Status: DC

## 2013-01-09 ENCOUNTER — Ambulatory Visit (INDEPENDENT_AMBULATORY_CARE_PROVIDER_SITE_OTHER): Payer: PRIVATE HEALTH INSURANCE | Admitting: Pharmacist

## 2013-01-09 DIAGNOSIS — I6350 Cerebral infarction due to unspecified occlusion or stenosis of unspecified cerebral artery: Secondary | ICD-10-CM

## 2013-01-09 DIAGNOSIS — I2699 Other pulmonary embolism without acute cor pulmonale: Secondary | ICD-10-CM

## 2013-01-09 LAB — POCT INR: INR: 2.4

## 2013-02-14 ENCOUNTER — Telehealth: Payer: Self-pay | Admitting: Endocrinology

## 2013-02-14 NOTE — Telephone Encounter (Signed)
Left message pt to call the office and make a f/u appt per Dr. Loanne Drilling

## 2013-02-14 NOTE — Telephone Encounter (Signed)
Pt dtr states she needs a rx for a walker that has the wheels and seat on it, chucks and pull-ups [facility], also dtr states urologist stated that pt needs to have her kidneys checked every 2 months since she has had a kidney transplant.

## 2013-02-14 NOTE — Telephone Encounter (Signed)
[place] agency will need justification for this. Please come in soon, as f/u ov is about due anyway.

## 2013-02-19 ENCOUNTER — Ambulatory Visit (INDEPENDENT_AMBULATORY_CARE_PROVIDER_SITE_OTHER): Payer: PRIVATE HEALTH INSURANCE

## 2013-02-19 DIAGNOSIS — I6350 Cerebral infarction due to unspecified occlusion or stenosis of unspecified cerebral artery: Secondary | ICD-10-CM

## 2013-02-19 DIAGNOSIS — I2699 Other pulmonary embolism without acute cor pulmonale: Secondary | ICD-10-CM

## 2013-02-19 LAB — POCT INR: INR: 2.5

## 2013-02-22 ENCOUNTER — Other Ambulatory Visit: Payer: Self-pay | Admitting: Endocrinology

## 2013-03-14 ENCOUNTER — Ambulatory Visit (INDEPENDENT_AMBULATORY_CARE_PROVIDER_SITE_OTHER): Payer: PRIVATE HEALTH INSURANCE | Admitting: Pharmacist

## 2013-03-14 DIAGNOSIS — I2699 Other pulmonary embolism without acute cor pulmonale: Secondary | ICD-10-CM

## 2013-03-14 DIAGNOSIS — I6350 Cerebral infarction due to unspecified occlusion or stenosis of unspecified cerebral artery: Secondary | ICD-10-CM

## 2013-03-14 LAB — POCT INR: INR: 3.8

## 2013-04-06 ENCOUNTER — Ambulatory Visit (INDEPENDENT_AMBULATORY_CARE_PROVIDER_SITE_OTHER): Payer: PRIVATE HEALTH INSURANCE | Admitting: Pharmacist

## 2013-04-06 DIAGNOSIS — I6350 Cerebral infarction due to unspecified occlusion or stenosis of unspecified cerebral artery: Secondary | ICD-10-CM

## 2013-04-06 DIAGNOSIS — I2699 Other pulmonary embolism without acute cor pulmonale: Secondary | ICD-10-CM

## 2013-04-06 LAB — POCT INR: INR: 2.7

## 2013-04-13 ENCOUNTER — Encounter: Payer: PRIVATE HEALTH INSURANCE | Admitting: Endocrinology

## 2013-04-27 ENCOUNTER — Ambulatory Visit: Payer: PRIVATE HEALTH INSURANCE | Admitting: Endocrinology

## 2013-04-30 ENCOUNTER — Ambulatory Visit (INDEPENDENT_AMBULATORY_CARE_PROVIDER_SITE_OTHER): Payer: PRIVATE HEALTH INSURANCE | Admitting: Endocrinology

## 2013-04-30 ENCOUNTER — Other Ambulatory Visit: Payer: Self-pay | Admitting: Endocrinology

## 2013-04-30 ENCOUNTER — Encounter: Payer: Self-pay | Admitting: Endocrinology

## 2013-04-30 VITALS — BP 140/98 | HR 60 | Temp 98.2°F | Resp 12 | Ht 64.0 in | Wt 161.0 lb

## 2013-04-30 DIAGNOSIS — N63 Unspecified lump in unspecified breast: Secondary | ICD-10-CM

## 2013-04-30 DIAGNOSIS — M109 Gout, unspecified: Secondary | ICD-10-CM

## 2013-04-30 DIAGNOSIS — N259 Disorder resulting from impaired renal tubular function, unspecified: Secondary | ICD-10-CM

## 2013-04-30 DIAGNOSIS — E1129 Type 2 diabetes mellitus with other diabetic kidney complication: Secondary | ICD-10-CM

## 2013-04-30 DIAGNOSIS — N6320 Unspecified lump in the left breast, unspecified quadrant: Secondary | ICD-10-CM

## 2013-04-30 DIAGNOSIS — Z23 Encounter for immunization: Secondary | ICD-10-CM

## 2013-04-30 DIAGNOSIS — E041 Nontoxic single thyroid nodule: Secondary | ICD-10-CM

## 2013-04-30 DIAGNOSIS — I10 Essential (primary) hypertension: Secondary | ICD-10-CM

## 2013-04-30 DIAGNOSIS — Z79899 Other long term (current) drug therapy: Secondary | ICD-10-CM

## 2013-04-30 NOTE — Progress Notes (Signed)
Subjective:    Patient ID: Connie Ruiz, female    DOB: 1961-04-03, 52 y.o.   MRN: [id number]  HPI Pt states 1 week of slight pain at the left breast, and assoc swelling there.   Past Medical History  Diagnosis Date  . THYROID NODULE, LEFT 04/10/2009  . DIABETES MELLITUS, TYPE II 08/21/2007  . HYPERLIPIDEMIA 08/21/2007  . GOUT 08/21/2007  . HYPERTENSION 08/21/2007    Dr. Andree Elk, [place] 08/21/2007  . CVA 04/17/2010  . CEREBROVASCULAR ACCIDENT, ACUTE 04/15/2010  . GERD 08/21/2007  . RENAL INSUFFICIENCY 08/21/2007  . LUPUS 08/21/2007  . OSTEOPOROSIS 08/21/2007    Rheumatol at [facility]  . DVT, HX OF 08/21/2007  . CLOSTRIDIUM DIFFICILE COLITIS, HX OF 08/21/2007  . KIDNEY TRANSPLANTATION, HX OF 08/22/2007    s/p renal transplant-Dr. Andree Elk, [facility]  . Pulmonary embolism 07/16/2010  . Renal failure   . Current use of long term anticoagulation     Dr. Andree Elk, [facility]  . Depression     Dr. Andree Elk, [facility]    Past Surgical History  Procedure Laterality Date  . Cholecystectomy    . Tubal ligation      History   Social History  . Marital Status: Divorced    Spouse Name: N/A    Number of Children: 1  . Years of Education: N/A   Occupational History  . DISABILITY    Social History Main Topics  . Smoking status: Never Smoker   . Smokeless tobacco: Not on file  . Alcohol Use: No  . Drug Use: No  . Sexual Activity:    Other Topics Concern  . Not on file   Social History Narrative   Retired   Regular exercise-no    Current Outpatient Prescriptions on File Prior to Visit  Medication Sig Dispense Refill  . calcitRIOL (ROCALTROL) 0.25 MCG capsule Take 1 capsule (0.25 mcg total) by mouth daily.  30 capsule  3  . diltiazem (CARDIZEM CD) 180 MG 24 hr capsule 180 mg. Take 1 capsule (180 mg total) by mouth daily.      Marland Kitchen doxycycline (VIBRA-TABS) 100 MG tablet Take 1 tablet (100 mg total) by mouth 2 (two) times daily.  14 tablet  0  . esomeprazole (NEXIUM) 20 MG  capsule 20 mg. Take 1 capsule (20 mg total) by mouth every morning before breakfast.      . folic acid (FOLVITE) 1 MG tablet Take 1 mg by mouth daily.        Marland Kitchen glimepiride (AMARYL) 1 MG tablet 1 mg. Take 1 mg by mouth every morning before breakfast.      . glucose blood (PRODIGY NO CODING BLOOD GLUC) test strip Use as instructed once daily dx 250.00      . HYDROcodone-acetaminophen (NORCO/VICODIN) 5-325 MG per tablet Take 1 tablet by mouth every 4 (four) hours as needed for pain.  10 tablet  0  . losartan (COZAAR) 50 MG tablet Take 50 mg by mouth daily.      . mycophenolate (CELLCEPT) 250 MG capsule Take 250 mg by mouth 2 (two) times daily.       Marland Kitchen nystatin (MYCOSTATIN) powder Apply topically. 02/03/2006      . predniSONE (DELTASONE) 1 MG tablet Take 3 mg by mouth daily.      . sertraline (ZOLOFT) 100 MG tablet Take 100 mg by mouth daily.        . simvastatin (ZOCOR) 20 MG tablet Take 20 mg by mouth every evening.      Marland Kitchen  solifenacin (VESICARE) 10 MG tablet Take 10 mg by mouth daily.      . tacrolimus (PROGRAF) 0.5 MG capsule Take 0.5 mg by mouth 2 (two) times daily.      Marland Kitchen warfarin (COUMADIN) 5 MG tablet Take 1 tablet (5 mg total) by mouth as directed.  40 tablet  3   No current facility-administered medications on file prior to visit.    Allergies  Allergen Reactions  . Oxycodone-Acetaminophen Shortness Of Breath and Nausea Only  . Propoxyphene-Acetaminophen Shortness Of Breath and Nausea Only  . Sulfonamide Derivatives Shortness Of Breath and Nausea Only  . Codeine     REACTION: nausea  . Latex     Family History  Problem Relation Age of Onset  . Cancer Neg Hx   . Heart attack Mother   . Heart disease Father     BP 140/98  Pulse 60  Temp(Src) 98.2 F (36.8 C) (Oral)  Resp 12  Ht 5\' 4"  (1.626 m)  Wt 161 lb (73.029 kg)  BMI 27.62 kg/m2   Review of Systems Denies galactorrhea and rash    Objective:   Physical Exam VITAL SIGNS:  See vs page GENERAL: no distress.  In  wheelchair. Left breast: at 9 o'clock, a 4-cm diameter tender nodule.  No erythema or nipple d/c.        Assessment & Plan:  Breast mass, new HTN: with probable situational component.

## 2013-04-30 NOTE — Patient Instructions (Addendum)
A mammogram is requested for you today.  We'll contact you with results.   Refer to a surgery specialist.  you will receive a phone call, about a day and time for an appointment.   blood tests are being requested for you today.  We'll contact you with results. Please come back for a follow-up appointment in 3 months.

## 2013-05-04 ENCOUNTER — Ambulatory Visit (INDEPENDENT_AMBULATORY_CARE_PROVIDER_SITE_OTHER): Payer: PRIVATE HEALTH INSURANCE | Admitting: Pharmacist

## 2013-05-04 DIAGNOSIS — I6350 Cerebral infarction due to unspecified occlusion or stenosis of unspecified cerebral artery: Secondary | ICD-10-CM

## 2013-05-04 DIAGNOSIS — I2699 Other pulmonary embolism without acute cor pulmonale: Secondary | ICD-10-CM

## 2013-05-04 LAB — POCT INR: INR: 2.9

## 2013-05-05 ENCOUNTER — Other Ambulatory Visit: Payer: Self-pay | Admitting: Cardiovascular Disease

## 2013-05-10 ENCOUNTER — Encounter (HOSPITAL_COMMUNITY): Payer: Self-pay | Admitting: Emergency Medicine

## 2013-05-10 ENCOUNTER — Emergency Department (INDEPENDENT_AMBULATORY_CARE_PROVIDER_SITE_OTHER)
Admission: EM | Admit: 2013-05-10 | Discharge: 2013-05-10 | Disposition: A | Discharge status: Home / Self Care | Payer: PRIVATE HEALTH INSURANCE | Source: Home / Self Care

## 2013-05-10 ENCOUNTER — Encounter (INDEPENDENT_AMBULATORY_CARE_PROVIDER_SITE_OTHER): Payer: Self-pay | Admitting: Surgery

## 2013-05-10 ENCOUNTER — Ambulatory Visit (INDEPENDENT_AMBULATORY_CARE_PROVIDER_SITE_OTHER): Payer: PRIVATE HEALTH INSURANCE | Admitting: Surgery

## 2013-05-10 DIAGNOSIS — J019 Acute sinusitis, unspecified: Secondary | ICD-10-CM

## 2013-05-10 DIAGNOSIS — N6320 Unspecified lump in the left breast, unspecified quadrant: Secondary | ICD-10-CM

## 2013-05-10 DIAGNOSIS — N63 Unspecified lump in unspecified breast: Secondary | ICD-10-CM

## 2013-05-10 DIAGNOSIS — I10 Essential (primary) hypertension: Secondary | ICD-10-CM

## 2013-05-10 DIAGNOSIS — I160 Hypertensive urgency: Secondary | ICD-10-CM

## 2013-05-10 MED ORDER — CLONIDINE HCL 0.1 MG PO TABS
0.1000 mg | ORAL_TABLET | Freq: Once | ORAL | Status: AC
  Administered 2013-05-10: 0.1 mg via ORAL

## 2013-05-10 MED ORDER — CLONIDINE HCL 0.1 MG PO TABS
ORAL_TABLET | ORAL | Status: AC
  Filled 2013-05-10: qty 1

## 2013-05-10 MED ORDER — AMOXICILLIN-POT CLAVULANATE 875-125 MG PO TABS: 1.0000 | ORAL_TABLET | Freq: Two times a day (BID) | ORAL | Status: DC

## 2013-05-10 NOTE — ED Notes (Addendum)
C/o sore throat onset 2 days ago, then she got a runny nose, cough, and fever.  C/o mild L earache.  Cough is non-productive.  C/o aching in all her joints.

## 2013-05-10 NOTE — Progress Notes (Signed)
Patient ID: Connie Ruiz, female   DOB: Jan 25, 1961, 52 y.o.   MRN: [id number]  Chief Complaint  Patient presents with  . New Evaluation    eval left breast mass    HPI Connie Ruiz is a 52 y.o. female.   HPI She is referred by Dr. Renato Shin for evaluation of a left breast mass. When she saw Dr. Loanne Drilling, the mass had been present approximately one week.  She described pain at the site. On his examination, the mass was approximate 4 cm in size. Since then, she reports it has decreased in size. She has no previous problems with her breast. She denies nipple discharge. She is otherwise without acute complaints. She has multiple chronic medical problems Past Medical History  Diagnosis Date  . THYROID NODULE, LEFT 04/10/2009  . DIABETES MELLITUS, TYPE II 08/21/2007  . HYPERLIPIDEMIA 08/21/2007  . GOUT 08/21/2007  . HYPERTENSION 08/21/2007    Dr. Andree Elk, [place] 08/21/2007  . CVA 04/17/2010  . CEREBROVASCULAR ACCIDENT, ACUTE 04/15/2010  . GERD 08/21/2007  . RENAL INSUFFICIENCY 08/21/2007  . LUPUS 08/21/2007  . OSTEOPOROSIS 08/21/2007    Rheumatol at [facility]  . DVT, HX OF 08/21/2007  . CLOSTRIDIUM DIFFICILE COLITIS, HX OF 08/21/2007  . KIDNEY TRANSPLANTATION, HX OF 08/22/2007    s/p renal transplant-Dr. Andree Elk, [facility]  . Pulmonary embolism 07/16/2010  . Renal failure   . Current use of long term anticoagulation     Dr. Andree Elk, [facility]  . Depression     Dr. Andree Elk, [facility]    Past Surgical History  Procedure Laterality Date  . Cholecystectomy    . Tubal ligation      Family History  Problem Relation Age of Onset  . Cancer Neg Hx   . Heart attack Mother   . Heart disease Father     Social History History  Substance Use Topics  . Smoking status: Never Smoker   . Smokeless tobacco: Not on file  . Alcohol Use: No    Allergies  Allergen Reactions  . Oxycodone-Acetaminophen Shortness Of Breath and Nausea Only  . Propoxyphene-Acetaminophen Shortness Of Breath  and Nausea Only  . Sulfonamide Derivatives Shortness Of Breath and Nausea Only  . Codeine     REACTION: nausea  . Latex     Current Outpatient Prescriptions  Medication Sig Dispense Refill  . calcitRIOL (ROCALTROL) 0.25 MCG capsule Take 1 capsule (0.25 mcg total) by mouth daily.  30 capsule  3  . diltiazem (CARDIZEM CD) 180 MG 24 hr capsule 180 mg. Take 1 capsule (180 mg total) by mouth daily.      Marland Kitchen doxycycline (VIBRA-TABS) 100 MG tablet Take 1 tablet (100 mg total) by mouth 2 (two) times daily.  14 tablet  0  . esomeprazole (NEXIUM) 20 MG capsule 20 mg. Take 1 capsule (20 mg total) by mouth every morning before breakfast.      . folic acid (FOLVITE) 1 MG tablet Take 1 mg by mouth daily.        Marland Kitchen glimepiride (AMARYL) 1 MG tablet 1 mg. Take 1 mg by mouth every morning before breakfast.      . glucose blood (PRODIGY NO CODING BLOOD GLUC) test strip Use as instructed once daily dx 250.00      . HYDROcodone-acetaminophen (NORCO/VICODIN) 5-325 MG per tablet Take 1 tablet by mouth every 4 (four) hours as needed for pain.  10 tablet  0  . losartan (COZAAR) 50 MG tablet Take 50  mg by mouth daily.      . mycophenolate (CELLCEPT) 250 MG capsule Take 250 mg by mouth 2 (two) times daily.       Marland Kitchen nystatin (MYCOSTATIN) powder Apply topically. 02/03/2006      . predniSONE (DELTASONE) 1 MG tablet Take 3 mg by mouth daily.      . sertraline (ZOLOFT) 100 MG tablet Take 100 mg by mouth daily.        . simvastatin (ZOCOR) 20 MG tablet Take 20 mg by mouth every evening.      . solifenacin (VESICARE) 10 MG tablet Take 10 mg by mouth daily.      . tacrolimus (PROGRAF) 0.5 MG capsule Take 0.5 mg by mouth 2 (two) times daily.      Marland Kitchen warfarin (COUMADIN) 5 MG tablet TAKE 1 TABLET BY MOUTH AS DIRECTED  45 tablet  2   No current facility-administered medications for this visit.    Review of Systems Review of Systems  Constitutional: Negative for fever, chills and unexpected weight change.  HENT: Negative for  congestion, hearing loss, sore throat, trouble swallowing and voice change.   Eyes: Negative for visual disturbance.  Respiratory: Positive for shortness of breath. Negative for cough and wheezing.   Cardiovascular: Negative for chest pain, palpitations and leg swelling.  Gastrointestinal: Negative for nausea, vomiting, abdominal pain, diarrhea, constipation, blood in stool, abdominal distention and anal bleeding.  Genitourinary: Negative for hematuria, vaginal bleeding and difficulty urinating.  Musculoskeletal: Positive for arthralgias, gait problem and myalgias.  Skin: Negative for rash and wound.  Neurological: Positive for weakness and headaches. Negative for seizures and syncope.  Hematological: Negative for adenopathy. Does not bruise/bleed easily.  Psychiatric/Behavioral: Negative for confusion.    There were no vitals taken for this visit.  Physical Exam Physical Exam  Constitutional: She is oriented to person, place, and time. She appears well-developed. No distress.  HENT:  Head: Normocephalic and atraumatic.  Right Ear: External ear normal.  Left Ear: External ear normal.  Nose: Nose normal.  Eyes: Conjunctivae are normal. Pupils are equal, round, and reactive to light. Right eye exhibits no discharge. Left eye exhibits no discharge. No scleral icterus.  Neck: Normal range of motion. Neck supple. No tracheal deviation present. No thyromegaly present.  Cardiovascular: Normal rate, regular rhythm, normal heart sounds and intact distal pulses.   No murmur heard. Pulmonary/Chest: Effort normal and breath sounds normal. No respiratory distress. She has no wheezes. She has no rales.  Abdominal: Soft. Bowel sounds are normal. She exhibits no distension. There is no tenderness.  Musculoskeletal: Normal range of motion. She exhibits no edema and no tenderness.  Lymphadenopathy:    She has no cervical adenopathy.    She has no axillary adenopathy.  Neurological: She is alert and  oriented to person, place, and time.  Skin: Skin is warm and dry. No rash noted. No erythema.  Psychiatric: Her behavior is normal.  Breasts: Her breasts are extremely large. There is a 1.5 cm mass at the 9:00 position of the left breast. It is hard but mobile and slightly tender. There is no erythema and no nipple discharge. There are no other breast masses in either breast  Data Reviewed   Assessment    Left breast mass     Plan    The etiology of this mass is uncertain. It is encouraging that it has decreased in size. Hopefully, the mammogram and ultrasound will help better determine the etiology of the mass. Surgical excision  may still be necessary. I will see her back after the x-ray studies.        Takila Kronberg A 05/10/2013, 4:44 PM

## 2013-05-10 NOTE — ED Provider Notes (Signed)
Medical screening examination/treatment/procedure(s) were performed by resident physician or non-physician practitioner and as supervising physician I was immediately available for consultation/collaboration.   Pauline Good MD.   Billy Fischer, MD 05/10/13 2119

## 2013-05-10 NOTE — ED Provider Notes (Signed)
CSN: [id number]     Arrival date & time 05/10/13  1706 History   None    Chief Complaint  Patient presents with  . Sore Throat   (Consider location/radiation/quality/duration/timing/severity/associated sxs/prior Treatment) HPI  Cough/Congestion - nasal drainage, sinus pain, ear fullness and congestion with productive cough x 2 days, worsening, accompanied by subjective fever and chills; pt without shortness of breath or chest pain; pt on chronic immunosuppression with mycophenolate and tacrolimus for kidney transplant   Hypertension - Patient on losartaan and diltiazem and notes that she takes both daily and took them today. She denies chest pain or shortness of breath. She denies recent caffeine or decongestant use.    Past Medical History  Diagnosis Date  . THYROID NODULE, LEFT 04/10/2009  . DIABETES MELLITUS, TYPE II 08/21/2007  . HYPERLIPIDEMIA 08/21/2007  . GOUT 08/21/2007  . HYPERTENSION 08/21/2007    Dr. Andree Elk, [place] 08/21/2007  . CVA 04/17/2010  . CEREBROVASCULAR ACCIDENT, ACUTE 04/15/2010  . GERD 08/21/2007  . RENAL INSUFFICIENCY 08/21/2007  . LUPUS 08/21/2007  . OSTEOPOROSIS 08/21/2007    Rheumatol at [facility]  . DVT, HX OF 08/21/2007  . CLOSTRIDIUM DIFFICILE COLITIS, HX OF 08/21/2007  . KIDNEY TRANSPLANTATION, HX OF 08/22/2007    s/p renal transplant-Dr. Andree Elk, [facility]  . Pulmonary embolism 07/16/2010  . Renal failure   . Current use of long term anticoagulation     Dr. Andree Elk, [facility]  . Depression     Dr. Andree Elk, [facility]   Past Surgical History  Procedure Laterality Date  . Cholecystectomy    . Tubal ligation    . Kidney transplant Right 2009  . Renal biopsy, open  1981   Family History  Problem Relation Age of Onset  . Cancer Neg Hx   . Heart attack Mother   . Heart disease Father    History  Substance Use Topics  . Smoking status: Never Smoker   . Smokeless tobacco: Not on file  . Alcohol Use: No   OB History   Grav Para Term  Preterm Abortions TAB SAB Ect Mult Living                 Review of Systems See HPI  Allergies  Oxycodone-acetaminophen; Propoxyphene-acetaminophen; Sulfonamide derivatives; Codeine; and Latex  Home Medications   Current Outpatient Rx  Name  Route  Sig  Dispense  Refill  . calcitRIOL (ROCALTROL) 0.25 MCG capsule   Oral   Take 1 capsule (0.25 mcg total) by mouth daily.   30 capsule   3   . diltiazem (CARDIZEM CD) 180 MG 24 hr capsule      180 mg. Take 1 capsule (180 mg total) by mouth daily.         Marland Kitchen esomeprazole (NEXIUM) 20 MG capsule      20 mg. Take 1 capsule (20 mg total) by mouth every morning before breakfast.         . folic acid (FOLVITE) 1 MG tablet   Oral   Take 1 mg by mouth daily.           Marland Kitchen glimepiride (AMARYL) 1 MG tablet      1 mg. Take 1 mg by mouth every morning before breakfast.         . glucose blood (PRODIGY NO CODING BLOOD GLUC) test strip      Use as instructed once daily dx 250.00         . HYDROcodone-acetaminophen (NORCO/VICODIN) 5-325 MG  per tablet   Oral   Take 1 tablet by mouth every 4 (four) hours as needed for pain.   10 tablet   0   . losartan (COZAAR) 50 MG tablet   Oral   Take 50 mg by mouth daily.         . mycophenolate (CELLCEPT) 250 MG capsule   Oral   Take 250 mg by mouth 2 (two) times daily.          Marland Kitchen nystatin (MYCOSTATIN) powder      Apply topically. 02/03/2006         . predniSONE (DELTASONE) 1 MG tablet   Oral   Take 3 mg by mouth daily.         . sertraline (ZOLOFT) 100 MG tablet   Oral   Take 100 mg by mouth daily.           . simvastatin (ZOCOR) 20 MG tablet   Oral   Take 20 mg by mouth every evening.         . solifenacin (VESICARE) 10 MG tablet   Oral   Take 10 mg by mouth daily.         . tacrolimus (PROGRAF) 0.5 MG capsule   Oral   Take 0.5 mg by mouth 2 (two) times daily.         Marland Kitchen warfarin (COUMADIN) 5 MG tablet      TAKE 1 TABLET BY MOUTH AS DIRECTED   45  tablet   2   . amoxicillin-clavulanate (AUGMENTIN) 875-125 MG per tablet   Oral   Take 1 tablet by mouth 2 (two) times daily.   20 tablet   0    BP 146/102  Pulse 102  Temp(Src) 100.1 F (37.8 C) (Oral)  Resp 22  SpO2 97% Physical Exam  Constitutional: She appears well-developed.  Non-toxic appearance. She appears ill.  Vitals signs noteworthy for BP 193/116  HENT:  Head: Normocephalic and atraumatic.  Right Ear: Tympanic membrane normal.  Left Ear: Tympanic membrane normal.  Nose: Rhinorrhea present. Right sinus exhibits maxillary sinus tenderness and frontal sinus tenderness. Left sinus exhibits maxillary sinus tenderness and frontal sinus tenderness.  Mouth/Throat: Uvula is midline, oropharynx is clear and moist and mucous membranes are normal.  Eyes: Conjunctivae and EOM are normal. Pupils are equal, round, and reactive to light. Right eye exhibits no chemosis. Left eye exhibits no chemosis.  Mild exophthalmos bilaterally  Neck: Normal range of motion.  Cardiovascular: Normal rate and regular rhythm.   Murmur heard. Pulmonary/Chest: Effort normal and breath sounds normal. No respiratory distress. She has no wheezes.  Abdominal: Soft.  Lymphadenopathy:    She has no cervical adenopathy.  Neurological: She is alert.    ED Course  Procedures (including critical care time) Labs Review Labs Reviewed - No data to display Imaging Review No results found.    MDM   1. Acute rhinosinusitis   2. Hypertensive urgency    Acute rhinosinusitis - Given patient's immunosuppressed state due to kidney transplant, I will be more aggressive in treating this. Augmentin 875 mg BID X 10 days. Instructed to f/u with PCP.   HTN Urgency - Pt initial BP 193/116. No evidence of end organ damage. Given clonidine 0.1 mg x 1. 30 minutes later BP 146/102. Pt appropriate for d/c and f/u with PCP regarding blood pressure.     Angelica Ran, MD 05/10/13 1946

## 2013-05-16 ENCOUNTER — Ambulatory Visit
Admission: RE | Admit: 2013-05-16 | Discharge: 2013-05-16 | Disposition: A | Discharge status: Home / Self Care | Payer: PRIVATE HEALTH INSURANCE | Source: Ambulatory Visit | Attending: Endocrinology | Admitting: Endocrinology

## 2013-05-16 DIAGNOSIS — N6320 Unspecified lump in the left breast, unspecified quadrant: Secondary | ICD-10-CM

## 2013-05-22 ENCOUNTER — Ambulatory Visit (INDEPENDENT_AMBULATORY_CARE_PROVIDER_SITE_OTHER): Payer: PRIVATE HEALTH INSURANCE | Admitting: Surgery

## 2013-05-22 ENCOUNTER — Encounter (INDEPENDENT_AMBULATORY_CARE_PROVIDER_SITE_OTHER): Payer: Self-pay | Admitting: Surgery

## 2013-05-22 VITALS — BP 130/100 | HR 72 | Temp 98.6°F | Resp 14 | Ht 64.0 in | Wt 172.6 lb

## 2013-05-22 DIAGNOSIS — N6320 Unspecified lump in the left breast, unspecified quadrant: Secondary | ICD-10-CM

## 2013-05-22 DIAGNOSIS — N63 Unspecified lump in unspecified breast: Secondary | ICD-10-CM

## 2013-05-22 NOTE — Progress Notes (Signed)
Subjective:     Patient ID: Connie Ruiz, female   DOB: [birth year], 52 y.o.   MRN: [id number]  HPI She is here to followup her left breast mass. She has since had a mammogram and ultrasound. She has no complaints today.  Review of Systems     Objective:   Physical Exam To me, a left breast mass at the 9:00 position is even smaller. It is only about a centimeter in size. Her mammograms are unremarkable. Her ultrasound showed a mass was 1.3 cm and consistent with fat necrosis    Assessment:     Left breast mass     Plan:     I do believe this is benign. I discussed removal versus followup in 3 months with repeat ultrasound. She elected to continue conservative management which I believe is reasonable. I will see her back in 3 months

## 2013-06-14 ENCOUNTER — Encounter (HOSPITAL_COMMUNITY): Payer: Self-pay | Admitting: Emergency Medicine

## 2013-06-14 ENCOUNTER — Emergency Department (HOSPITAL_COMMUNITY): Payer: PRIVATE HEALTH INSURANCE

## 2013-06-14 ENCOUNTER — Observation Stay (HOSPITAL_COMMUNITY): Payer: PRIVATE HEALTH INSURANCE

## 2013-06-14 ENCOUNTER — Inpatient Hospital Stay (HOSPITAL_COMMUNITY)
Admission: EM | Admit: 2013-06-14 | Discharge: 2013-06-20 | DRG: 071 | Disposition: A | Discharge status: Skilled Nursing Facility | Payer: PRIVATE HEALTH INSURANCE | Attending: Internal Medicine | Admitting: Internal Medicine

## 2013-06-14 DIAGNOSIS — R29898 Other symptoms and signs involving the musculoskeletal system: Secondary | ICD-10-CM | POA: Diagnosis present

## 2013-06-14 DIAGNOSIS — I69998 Other sequelae following unspecified cerebrovascular disease: Secondary | ICD-10-CM

## 2013-06-14 DIAGNOSIS — E1165 Type 2 diabetes mellitus with hyperglycemia: Secondary | ICD-10-CM | POA: Diagnosis present

## 2013-06-14 DIAGNOSIS — G9340 Encephalopathy, unspecified: Secondary | ICD-10-CM

## 2013-06-14 DIAGNOSIS — M79602 Pain in left arm: Secondary | ICD-10-CM

## 2013-06-14 DIAGNOSIS — M329 Systemic lupus erythematosus, unspecified: Secondary | ICD-10-CM | POA: Diagnosis present

## 2013-06-14 DIAGNOSIS — M810 Age-related osteoporosis without current pathological fracture: Secondary | ICD-10-CM | POA: Diagnosis present

## 2013-06-14 DIAGNOSIS — I6350 Cerebral infarction due to unspecified occlusion or stenosis of unspecified cerebral artery: Secondary | ICD-10-CM

## 2013-06-14 DIAGNOSIS — M109 Gout, unspecified: Secondary | ICD-10-CM

## 2013-06-14 DIAGNOSIS — Z86718 Personal history of other venous thrombosis and embolism: Secondary | ICD-10-CM

## 2013-06-14 DIAGNOSIS — G459 Transient cerebral ischemic attack, unspecified: Secondary | ICD-10-CM

## 2013-06-14 DIAGNOSIS — Z91040 Latex allergy status: Secondary | ICD-10-CM

## 2013-06-14 DIAGNOSIS — I2699 Other pulmonary embolism without acute cor pulmonale: Secondary | ICD-10-CM

## 2013-06-14 DIAGNOSIS — R4702 Dysphasia: Secondary | ICD-10-CM

## 2013-06-14 DIAGNOSIS — E785 Hyperlipidemia, unspecified: Secondary | ICD-10-CM | POA: Diagnosis present

## 2013-06-14 DIAGNOSIS — M6282 Rhabdomyolysis: Secondary | ICD-10-CM

## 2013-06-14 DIAGNOSIS — I6930 Unspecified sequelae of cerebral infarction: Secondary | ICD-10-CM

## 2013-06-14 DIAGNOSIS — R531 Weakness: Secondary | ICD-10-CM | POA: Diagnosis present

## 2013-06-14 DIAGNOSIS — I509 Heart failure, unspecified: Secondary | ICD-10-CM

## 2013-06-14 DIAGNOSIS — N390 Urinary tract infection, site not specified: Secondary | ICD-10-CM | POA: Diagnosis present

## 2013-06-14 DIAGNOSIS — I5030 Unspecified diastolic (congestive) heart failure: Secondary | ICD-10-CM | POA: Diagnosis present

## 2013-06-14 DIAGNOSIS — R4701 Aphasia: Secondary | ICD-10-CM

## 2013-06-14 DIAGNOSIS — E876 Hypokalemia: Secondary | ICD-10-CM | POA: Diagnosis present

## 2013-06-14 DIAGNOSIS — N058 Unspecified nephritic syndrome with other morphologic changes: Secondary | ICD-10-CM | POA: Diagnosis present

## 2013-06-14 DIAGNOSIS — I69992 Facial weakness following unspecified cerebrovascular disease: Secondary | ICD-10-CM

## 2013-06-14 DIAGNOSIS — IMO0002 Reserved for concepts with insufficient information to code with codable children: Secondary | ICD-10-CM

## 2013-06-14 DIAGNOSIS — Z7901 Long term (current) use of anticoagulants: Secondary | ICD-10-CM

## 2013-06-14 DIAGNOSIS — I6990 Unspecified sequelae of unspecified cerebrovascular disease: Secondary | ICD-10-CM

## 2013-06-14 DIAGNOSIS — Z940 Kidney transplant status: Secondary | ICD-10-CM

## 2013-06-14 DIAGNOSIS — I82409 Acute embolism and thrombosis of unspecified deep veins of unspecified lower extremity: Secondary | ICD-10-CM

## 2013-06-14 DIAGNOSIS — Z86711 Personal history of pulmonary embolism: Secondary | ICD-10-CM

## 2013-06-14 DIAGNOSIS — I10 Essential (primary) hypertension: Secondary | ICD-10-CM

## 2013-06-14 DIAGNOSIS — B964 Proteus (mirabilis) (morganii) as the cause of diseases classified elsewhere: Secondary | ICD-10-CM | POA: Diagnosis present

## 2013-06-14 DIAGNOSIS — F3289 Other specified depressive episodes: Secondary | ICD-10-CM | POA: Diagnosis present

## 2013-06-14 DIAGNOSIS — F329 Major depressive disorder, single episode, unspecified: Secondary | ICD-10-CM | POA: Diagnosis present

## 2013-06-14 DIAGNOSIS — D6859 Other primary thrombophilia: Secondary | ICD-10-CM | POA: Diagnosis present

## 2013-06-14 DIAGNOSIS — M25519 Pain in unspecified shoulder: Secondary | ICD-10-CM | POA: Diagnosis present

## 2013-06-14 DIAGNOSIS — I69922 Dysarthria following unspecified cerebrovascular disease: Secondary | ICD-10-CM

## 2013-06-14 DIAGNOSIS — K219 Gastro-esophageal reflux disease without esophagitis: Secondary | ICD-10-CM

## 2013-06-14 DIAGNOSIS — E1129 Type 2 diabetes mellitus with other diabetic kidney complication: Secondary | ICD-10-CM

## 2013-06-14 HISTORY — DX: Unspecified sequelae of cerebral infarction: I69.30

## 2013-06-14 HISTORY — DX: Weakness: R53.1

## 2013-06-14 LAB — POCT I-STAT TROPONIN I: Troponin i, poc: 0.02 ng/mL (ref 0.00–0.08)

## 2013-06-14 LAB — CBC WITH DIFFERENTIAL/PLATELET
Basophils Absolute: 0 10*3/uL (ref 0.0–0.1)
Basophils Relative: 0 % (ref 0–1)
Eosinophils Absolute: 0 10*3/uL (ref 0.0–0.7)
Eosinophils Relative: 0 % (ref 0–5)
HCT: 40.5 % (ref 36.0–46.0)
Hemoglobin: 13.6 g/dL (ref 12.0–15.0)
Lymphocytes Relative: 13 % (ref 12–46)
Lymphs Abs: 1.6 10*3/uL (ref 0.7–4.0)
MCH: 29.5 pg (ref 26.0–34.0)
MCHC: 33.6 g/dL (ref 30.0–36.0)
MCV: 87.9 fL (ref 78.0–100.0)
Monocytes Absolute: 0.9 10*3/uL (ref 0.1–1.0)
Monocytes Relative: 7 % (ref 3–12)
Neutro Abs: 9.9 10*3/uL — ABNORMAL HIGH (ref 1.7–7.7)
Neutrophils Relative %: 80 % — ABNORMAL HIGH (ref 43–77)
Platelets: 157 10*3/uL (ref 150–400)
RBC: 4.61 MIL/uL (ref 3.87–5.11)
RDW: 13.9 % (ref 11.5–15.5)
WBC: 12.4 10*3/uL — ABNORMAL HIGH (ref 4.0–10.5)

## 2013-06-14 LAB — COMPREHENSIVE METABOLIC PANEL
ALT: 25 U/L (ref 0–35)
AST: 67 U/L — ABNORMAL HIGH (ref 0–37)
Albumin: 3.3 g/dL — ABNORMAL LOW (ref 3.5–5.2)
Alkaline Phosphatase: 74 U/L (ref 39–117)
BUN: 13 mg/dL (ref 6–23)
CO2: 23 mEq/L (ref 19–32)
Calcium: 9.5 mg/dL (ref 8.4–10.5)
Chloride: 102 mEq/L (ref 96–112)
Creatinine, Ser: 0.55 mg/dL (ref 0.50–1.10)
GFR calc Af Amer: >90 mL/min (ref >90)
GFR calc non Af Amer: >90 mL/min (ref >90)
Glucose, Bld: 117 mg/dL — ABNORMAL HIGH (ref 70–99)
Potassium: 3.1 mEq/L — ABNORMAL LOW (ref 3.7–5.3)
Sodium: 141 mEq/L (ref 137–147)
Total Bilirubin: 1 mg/dL (ref 0.3–1.2)
Total Protein: 7.4 g/dL (ref 6.0–8.3)

## 2013-06-14 LAB — GLUCOSE, CAPILLARY: Glucose-Capillary: 125 mg/dL — ABNORMAL HIGH (ref 70–99)

## 2013-06-14 LAB — LIPASE, BLOOD: Lipase: 17 U/L (ref 11–59)

## 2013-06-14 LAB — APTT: aPTT: 25 seconds (ref 24–37)

## 2013-06-14 LAB — CK: Total CK: 2232 U/L — ABNORMAL HIGH (ref 7–177)

## 2013-06-14 LAB — PROTIME-INR
INR: 1.13 (ref 0.00–1.49)
Prothrombin Time: 14.3 seconds (ref 11.6–15.2)

## 2013-06-14 LAB — CG4 I-STAT (LACTIC ACID): Lactic Acid, Venous: 1.94 mmol/L (ref 0.5–2.2)

## 2013-06-14 MED ORDER — ACETAMINOPHEN 650 MG RE SUPP: 650.0000 mg | RECTAL | Status: DC | PRN

## 2013-06-14 MED ORDER — SODIUM CHLORIDE 0.9 % IV SOLN
INTRAVENOUS | Status: DC
  Administered 2013-06-14 – 2013-06-17 (×4): via INTRAVENOUS

## 2013-06-14 MED ORDER — SENNOSIDES-DOCUSATE SODIUM 8.6-50 MG PO TABS: 1.0000 | ORAL_TABLET | Freq: Every evening | ORAL | Status: DC | PRN

## 2013-06-14 MED ORDER — SODIUM CHLORIDE 0.9 % IV BOLUS (SEPSIS)
1000.0000 mL | Freq: Once | INTRAVENOUS | Status: AC
  Administered 2013-06-14: 1000 mL via INTRAVENOUS

## 2013-06-14 MED ORDER — WARFARIN - PHARMACIST DOSING INPATIENT
Freq: Every day | Status: DC
  Administered 2013-06-16: 18:00:00

## 2013-06-14 MED ORDER — POTASSIUM CHLORIDE 10 MEQ/100ML IV SOLN
10.0000 meq | INTRAVENOUS | Status: AC
  Administered 2013-06-14 (×3): 10 meq via INTRAVENOUS
  Filled 2013-06-14 (×3): qty 100

## 2013-06-14 MED ORDER — MYCOPHENOLATE MOFETIL 250 MG PO CAPS
250.0000 mg | ORAL_CAPSULE | Freq: Two times a day (BID) | ORAL | Status: DC
  Administered 2013-06-14 – 2013-06-20 (×11): 250 mg via ORAL
  Filled 2013-06-14 (×13): qty 1

## 2013-06-14 MED ORDER — ASPIRIN 325 MG PO TABS
325.0000 mg | ORAL_TABLET | Freq: Every day | ORAL | Status: DC
  Administered 2013-06-14 – 2013-06-19 (×6): 325 mg via ORAL
  Filled 2013-06-14 (×6): qty 1

## 2013-06-14 MED ORDER — SODIUM CHLORIDE 0.9 % IV BOLUS (SEPSIS): 500.0000 mL | Freq: Once | INTRAVENOUS | Status: DC

## 2013-06-14 MED ORDER — FENTANYL CITRATE 0.05 MG/ML IJ SOLN
100.0000 ug | Freq: Once | INTRAMUSCULAR | Status: AC
  Administered 2013-06-14: 100 ug via INTRAVENOUS
  Filled 2013-06-14: qty 2

## 2013-06-14 MED ORDER — ASPIRIN 300 MG RE SUPP
300.0000 mg | Freq: Every day | RECTAL | Status: DC
  Filled 2013-06-14 (×6): qty 1

## 2013-06-14 MED ORDER — DARIFENACIN HYDROBROMIDE ER 15 MG PO TB24
15.0000 mg | ORAL_TABLET | Freq: Every day | ORAL | Status: DC
  Administered 2013-06-14 – 2013-06-20 (×7): 15 mg via ORAL
  Filled 2013-06-14 (×7): qty 1

## 2013-06-14 MED ORDER — SIMVASTATIN 10 MG PO TABS
10.0000 mg | ORAL_TABLET | Freq: Every day | ORAL | Status: DC
  Administered 2013-06-14 – 2013-06-20 (×7): 10 mg via ORAL
  Filled 2013-06-14 (×6): qty 0.5
  Filled 2013-06-14: qty 1

## 2013-06-14 MED ORDER — DILTIAZEM HCL ER BEADS 180 MG PO CP24
180.0000 mg | ORAL_CAPSULE | Freq: Every day | ORAL | Status: DC
  Administered 2013-06-14: 180 mg via ORAL
  Filled 2013-06-14 (×4): qty 1

## 2013-06-14 MED ORDER — SERTRALINE HCL 100 MG PO TABS
100.0000 mg | ORAL_TABLET | Freq: Every day | ORAL | Status: DC
  Administered 2013-06-14 – 2013-06-20 (×7): 100 mg via ORAL
  Filled 2013-06-14 (×7): qty 1

## 2013-06-14 MED ORDER — CALCITRIOL 0.25 MCG PO CAPS
0.2500 ug | ORAL_CAPSULE | Freq: Every day | ORAL | Status: DC
  Administered 2013-06-14 – 2013-06-20 (×7): 0.25 ug via ORAL
  Filled 2013-06-14 (×7): qty 1

## 2013-06-14 MED ORDER — HYDROCODONE-ACETAMINOPHEN 5-325 MG PO TABS: 1.0000 | ORAL_TABLET | Freq: Four times a day (QID) | ORAL | Status: DC | PRN

## 2013-06-14 MED ORDER — ACETAMINOPHEN 325 MG PO TABS
650.0000 mg | ORAL_TABLET | ORAL | Status: DC | PRN
  Administered 2013-06-14: 650 mg via ORAL
  Filled 2013-06-14: qty 2

## 2013-06-14 MED ORDER — WARFARIN SODIUM 7.5 MG PO TABS
7.5000 mg | ORAL_TABLET | ORAL | Status: AC
  Administered 2013-06-14: 7.5 mg via ORAL
  Filled 2013-06-14: qty 1

## 2013-06-14 MED ORDER — TACROLIMUS 0.5 MG PO CAPS
0.5000 mg | ORAL_CAPSULE | Freq: Two times a day (BID) | ORAL | Status: DC
  Administered 2013-06-14 – 2013-06-20 (×11): 0.5 mg via ORAL
  Filled 2013-06-14 (×13): qty 1

## 2013-06-14 MED ORDER — PREDNISONE 1 MG PO TABS
1.0000 mg | ORAL_TABLET | Freq: Every day | ORAL | Status: DC
  Administered 2013-06-15 – 2013-06-20 (×6): 1 mg via ORAL
  Filled 2013-06-14 (×7): qty 1

## 2013-06-14 MED ORDER — HYDROMORPHONE HCL PF 1 MG/ML IJ SOLN
1.0000 mg | INTRAMUSCULAR | Status: DC | PRN
  Administered 2013-06-14 – 2013-06-18 (×15): 1 mg via INTRAVENOUS
  Filled 2013-06-14 (×17): qty 1

## 2013-06-14 MED ORDER — FOLIC ACID 1 MG PO TABS
1.0000 mg | ORAL_TABLET | Freq: Every day | ORAL | Status: DC
  Administered 2013-06-14 – 2013-06-20 (×7): 1 mg via ORAL
  Filled 2013-06-14 (×7): qty 1

## 2013-06-14 NOTE — Progress Notes (Signed)
ANTICOAGULATION CONSULT NOTE - Initial Consult  Pharmacy Consult:  Coumadin Indication:  APS  Allergies  Allergen Reactions  . Oxycodone-Acetaminophen Shortness Of Breath and Nausea Only  . Propoxyphene N-Acetaminophen Shortness Of Breath and Nausea Only  . Sulfonamide Derivatives Shortness Of Breath and Nausea Only  . Codeine     REACTION: nausea  . Latex     Patient Measurements: Height: 5' 4.17" (163 cm) Weight: 172 lb 9.9 oz (78.3 kg) IBW/kg (Calculated) : 55.1  Vital Signs: Temp: 98.5 F (36.9 C) (01/08 1657) Temp src: Oral (01/08 1657) BP: 144/77 mmHg (01/08 1700) Pulse Rate: 114 (01/08 1700)  Labs:  Recent Labs  06/14/13 1303 06/14/13 1602  HGB 13.6  --   HCT 40.5  --   PLT 157  --   APTT  --  25  LABPROT  --  14.3  INR  --  1.13  CREATININE 0.55  --   CKTOTAL 2232*  --     Estimated Creatinine Clearance: 83.6 ml/min (by C-G formula based on Cr of 0.55).   Medical History: Past Medical History  Diagnosis Date  . THYROID NODULE, LEFT 04/10/2009  . DIABETES MELLITUS, TYPE II 08/21/2007  . HYPERLIPIDEMIA 08/21/2007  . GOUT 08/21/2007  . HYPERTENSION 08/21/2007    Dr. Andree Elk, [place] 08/21/2007  . CVA 04/17/2010  . CEREBROVASCULAR ACCIDENT, ACUTE 04/15/2010  . GERD 08/21/2007  . RENAL INSUFFICIENCY 08/21/2007  . LUPUS 08/21/2007  . OSTEOPOROSIS 08/21/2007    Rheumatol at [facility]  . DVT, HX OF 08/21/2007  . CLOSTRIDIUM DIFFICILE COLITIS, HX OF 08/21/2007  . KIDNEY TRANSPLANTATION, HX OF 08/22/2007    s/p renal transplant-Dr. Andree Elk, [facility]  . Pulmonary embolism 07/16/2010  . Renal failure   . Current use of long term anticoagulation     Dr. Andree Elk, [facility]  . Depression     Dr. Andree Elk, [facility]  . History of stroke with residual effects   . Right sided weakness        Assessment: 38 YOF with history of recurrent CVAs, recurrent DVTs and PEs, DM, and APS admitted for new CVA work-up.  Pharmacy consulted to continue Coumadin  from PTA.  INR sub-therapeutic.  No acute intracranial finding per head CT.   Goal of Therapy:  INR 2-3 Monitor platelets by anticoagulation protocol: Yes    Plan:  - Coumadin 7.5mg  PO today - Daily PT / INR    Zekiel Torian D. Mina Marble, PharmD, BCPS Pager:  [phone] 06/14/2013, 7:10 PM

## 2013-06-14 NOTE — ED Provider Notes (Signed)
CSN: [id number]     Arrival date & time 06/14/13  1222 History   First MD Initiated Contact with Patient 06/14/13 1239     Chief Complaint  Patient presents with  . Fall  . Near Syncope  . Weakness   (Consider location/radiation/quality/duration/timing/severity/associated sxs/prior Treatment) HPI Comments: Patient is a 53 year old female with history of diabetes, hyperlipidemia, hypertension, congestive heart failure, CVA, renal insufficiency, renal transplant, lupus, history of DVT, C. Difficile, pulmonary embolism, right-sided weakness who presents today after being found in the bathtub. She was found by her aid who last saw her normal noon yesterday. He found her naked in an empty bathtub. She reports that she was curled up in a bowl on her right side. The patient reports that she did not loose consciousness and was trying to take a bath, but was too weak to get up. The aid reports that this is not consistent with her normal behavior. She reports pain everywhere and cannot localize the pain to one particular area.  The history is provided by the patient. No language interpreter was used.    Past Medical History  Diagnosis Date  . THYROID NODULE, LEFT 04/10/2009  . DIABETES MELLITUS, TYPE II 08/21/2007  . HYPERLIPIDEMIA 08/21/2007  . GOUT 08/21/2007  . HYPERTENSION 08/21/2007    Dr. Andree Elk, [place] 08/21/2007  . CVA 04/17/2010  . CEREBROVASCULAR ACCIDENT, ACUTE 04/15/2010  . GERD 08/21/2007  . RENAL INSUFFICIENCY 08/21/2007  . LUPUS 08/21/2007  . OSTEOPOROSIS 08/21/2007    Rheumatol at [facility]  . DVT, HX OF 08/21/2007  . CLOSTRIDIUM DIFFICILE COLITIS, HX OF 08/21/2007  . KIDNEY TRANSPLANTATION, HX OF 08/22/2007    s/p renal transplant-Dr. Andree Elk, [facility]  . Pulmonary embolism 07/16/2010  . Renal failure   . Current use of long term anticoagulation     Dr. Andree Elk, [facility]  . Depression     Dr. Andree Elk, [facility]  . History of stroke with residual effects   . Right sided  weakness    Past Surgical History  Procedure Laterality Date  . Cholecystectomy    . Tubal ligation    . Kidney transplant Right 2009  . Renal biopsy, open  1981   Family History  Problem Relation Age of Onset  . Cancer Neg Hx   . Heart attack Mother   . Heart disease Father    History  Substance Use Topics  . Smoking status: Never Smoker   . Smokeless tobacco: Not on file  . Alcohol Use: No   OB History   Grav Para Term Preterm Abortions TAB SAB Ect Mult Living                 Review of Systems  Unable to perform ROS: Mental status change    Allergies  Oxycodone-acetaminophen; Propoxyphene n-acetaminophen; Sulfonamide derivatives; Codeine; and Latex  Home Medications   Current Outpatient Rx  Name  Route  Sig  Dispense  Refill  . amoxicillin-clavulanate (AUGMENTIN) 875-125 MG per tablet   Oral   Take 1 tablet by mouth 2 (two) times daily.         . calcitRIOL (ROCALTROL) 0.25 MCG capsule   Oral   Take 0.25 mcg by mouth daily.         Marland Kitchen diltiazem (TIAZAC) 180 MG 24 hr capsule   Oral   Take 180 mg by mouth daily.         Marland Kitchen esomeprazole (NEXIUM) 20 MG capsule   Oral   Take  20 mg by mouth daily at 12 noon.         . folic acid (FOLVITE) 1 MG tablet   Oral   Take 1 mg by mouth daily.         Marland Kitchen glimepiride (AMARYL) 1 MG tablet   Oral   Take 1 mg by mouth daily with breakfast.         . glucose blood test strip   Other   1 each by Other route as needed for other. Use as instructed         . HYDROcodone-acetaminophen (NORCO/VICODIN) 5-325 MG per tablet   Oral   Take 1 tablet by mouth every 6 (six) hours as needed for moderate pain.         Marland Kitchen losartan (COZAAR) 50 MG tablet   Oral   Take 50 mg by mouth daily.         . mycophenolate (CELLCEPT) 250 MG capsule   Oral   Take 250 mg by mouth 2 (two) times daily.         Marland Kitchen nystatin (MYCOSTATIN) powder   Topical   Apply topically 4 (four) times daily.         . predniSONE  (DELTASONE) 1 MG tablet   Oral   Take 1 mg by mouth daily with breakfast.         . sertraline (ZOLOFT) 100 MG tablet   Oral   Take 100 mg by mouth daily.         . simvastatin (ZOCOR) 20 MG tablet   Oral   Take 20 mg by mouth daily.         . solifenacin (VESICARE) 10 MG tablet   Oral   Take by mouth daily.         . tacrolimus (PROGRAF) 0.5 MG capsule   Oral   Take 0.5 mg by mouth 2 (two) times daily.         Marland Kitchen warfarin (COUMADIN) 5 MG tablet   Oral   Take 5 mg by mouth daily.          BP 149/82  Pulse 109  Temp(Src) 98 F (36.7 C) (Oral)  SpO2 100% Physical Exam  Nursing note and vitals reviewed. Constitutional: She is oriented to person, place, and time. She appears well-developed and well-nourished. She appears ill. No distress.  HENT:  Head: Normocephalic and atraumatic.  Right Ear: External ear normal.  Left Ear: External ear normal.  Nose: Nose normal.  Mouth/Throat: Uvula is midline and oropharynx is clear and moist. Mucous membranes are dry.  Eyes: EOM are normal. Pupils are equal, round, and reactive to light.  Neck: Normal range of motion. No spinous process tenderness and no muscular tenderness present.  Cardiovascular: Normal rate, regular rhythm, normal heart sounds, intact distal pulses and normal pulses.   Pulmonary/Chest: Effort normal and breath sounds normal. No stridor. No respiratory distress. She has no wheezes. She has no rales.  Abdominal: Soft. She exhibits no distension.  Musculoskeletal: Normal range of motion.  Diffuse tenderness over right side of body. Redness to right shoulder, right hip.  Pelvis stable.  Right knee TTP laterally.   Neurological: She is alert and oriented to person, place, and time. She has normal strength.  Right side grip strength decreased.  Skin: Skin is warm and dry. She is not diaphoretic. No erythema.  4 cm skin tear to right elbow  Psychiatric: She has a normal mood and affect. Her behavior is  normal.    ED Course  Procedures (including critical care time) Labs Review Labs Reviewed  CBC WITH DIFFERENTIAL - Abnormal; Notable for the following:    WBC 12.4 (*)    Neutrophils Relative % 80 (*)    Neutro Abs 9.9 (*)    All other components within normal limits  COMPREHENSIVE METABOLIC PANEL - Abnormal; Notable for the following:    Potassium 3.1 (*)    Glucose, Bld 117 (*)    Albumin 3.3 (*)    AST 67 (*)    All other components within normal limits  CK - Abnormal; Notable for the following:    Total CK 2232 (*)    All other components within normal limits  LIPASE, BLOOD  URINALYSIS, ROUTINE W REFLEX MICROSCOPIC  CG4 I-STAT (LACTIC ACID)  POCT I-STAT TROPONIN I   Imaging Review Dg Thoracic Spine 2 View  06/14/2013   CLINICAL DATA:  Fall and back pain.  EXAM: THORACIC SPINE - 2 VIEW  COMPARISON:  Chest radiograph 10/27/2012  FINDINGS: The bone detail is limited on the lateral view and swimmer's view. The alignment of the thoracic spine and cervical-thoracic spine appear to be within normal limits. The vertebral body heights are grossly intact. Limited evaluation of the thoracolumbar junction on the lateral view.  IMPRESSION: The bone detail is limited on this examination but the alignment is grossly normal.   Electronically Signed   By: Markus Daft M.D.   On: 06/14/2013 14:23   Dg Shoulder Right  06/14/2013   CLINICAL DATA:  Fall and right shoulder pain.  EXAM: RIGHT SHOULDER - 2+ VIEW  COMPARISON:  None.  FINDINGS: Two views of the right shoulder were obtained. The scapular Y-view is limited but there no gross dislocation. No evidence for an acute fracture. AC joint is grossly intact.  IMPRESSION: Limited examination without gross abnormality.   Electronically Signed   By: Markus Daft M.D.   On: 06/14/2013 14:25   Dg Hip Complete Right  06/14/2013   CLINICAL DATA:  Fall and right hip pain.  EXAM: RIGHT HIP - COMPLETE 2+ VIEW  COMPARISON:  CT 08/25/2011  FINDINGS: Again noted are  multiple soft tissue calcifications in the lower abdomen. The pelvic bony ring is intact. Right hip is located without acute fracture.  IMPRESSION: No acute bone abnormality to the pelvis or right hip.   Electronically Signed   By: Markus Daft M.D.   On: 06/14/2013 14:26   Ct Head Wo Contrast  06/14/2013   CLINICAL DATA:  Fall in bathtub last night, found still in tub this morning. Trauma and neck pain.  EXAM: CT HEAD WITHOUT CONTRAST  CT MAXILLOFACIAL WITHOUT CONTRAST  CT CERVICAL SPINE WITHOUT CONTRAST  TECHNIQUE: Multidetector CT imaging of the head, cervical spine, and maxillofacial structures were performed using the standard protocol without intravenous contrast. Multiplanar CT image reconstructions of the cervical spine and maxillofacial structures were also generated.  COMPARISON:  03/23/2012  FINDINGS: CT HEAD FINDINGS  Small remote lacunar infarcts in both cerebellar hemispheres. These are similar to prior. Brainstem, thalami, right basal ganglia, and basilar cisterns unremarkable.  Remote infarct of the left basal ganglia with punctate dystrophic internal calcification along the infarct margin. Remote left frontoparietal infarct, similar appearance to prior, with considerable thinning of the left frontal periventricular white matter.  Atherosclerotic calcification of the carotid siphons. No intracranial hemorrhage, mass lesion, or acute CVA.  CT MAXILLOFACIAL FINDINGS  There is degenerative spurring of both mandibular condyles. No facial  fracture observed. No acute intra orbital findings. Dental cavity of tooth 29. Scattered tooth decay.  CT CERVICAL SPINE FINDINGS  Suspected small bone islands in the spinous process of C5 and potentially along the posterior superior endplate of T1. This could reflect incidental osteopoikilosis.  No cervical spine fracture or subluxation is observed. No vertebral subluxation is observed. No prevertebral soft tissue swelling. Bone island noted in the right 2nd rib  posteriorly on the axial images.  Prominent thyroid gland with indistinct nodularity noted. The patient has had prior thyroid ultrasound workups.  There is low-grade stranding in the right supraclavicular region, of uncertain significance and etiology, as shown on axial images 59-74 of series 10.  IMPRESSION: 1. No acute intracranial findings. Remote cerebellar and left basal ganglia infarcts is. Remote left frontoparietal infarct. 2. Low grade stranding/edema in the right supraclavicular region, without cervical spine abnormality or other specific cause for this low grade stranding observed. 3. Scattered tooht decay, particularly of tooth 20. 9. No facial fracture. No cervical spine fracture or cervical spine subluxation.   Electronically Signed   By: Sherryl Barters M.D.   On: 06/14/2013 15:27   Ct Cervical Spine Wo Contrast  06/14/2013   CLINICAL DATA:  Fall in bathtub last night, found still in tub this morning. Trauma and neck pain.  EXAM: CT HEAD WITHOUT CONTRAST  CT MAXILLOFACIAL WITHOUT CONTRAST  CT CERVICAL SPINE WITHOUT CONTRAST  TECHNIQUE: Multidetector CT imaging of the head, cervical spine, and maxillofacial structures were performed using the standard protocol without intravenous contrast. Multiplanar CT image reconstructions of the cervical spine and maxillofacial structures were also generated.  COMPARISON:  03/23/2012  FINDINGS: CT HEAD FINDINGS  Small remote lacunar infarcts in both cerebellar hemispheres. These are similar to prior. Brainstem, thalami, right basal ganglia, and basilar cisterns unremarkable.  Remote infarct of the left basal ganglia with punctate dystrophic internal calcification along the infarct margin. Remote left frontoparietal infarct, similar appearance to prior, with considerable thinning of the left frontal periventricular white matter.  Atherosclerotic calcification of the carotid siphons. No intracranial hemorrhage, mass lesion, or acute CVA.  CT MAXILLOFACIAL FINDINGS   There is degenerative spurring of both mandibular condyles. No facial fracture observed. No acute intra orbital findings. Dental cavity of tooth 29. Scattered tooth decay.  CT CERVICAL SPINE FINDINGS  Suspected small bone islands in the spinous process of C5 and potentially along the posterior superior endplate of T1. This could reflect incidental osteopoikilosis.  No cervical spine fracture or subluxation is observed. No vertebral subluxation is observed. No prevertebral soft tissue swelling. Bone island noted in the right 2nd rib posteriorly on the axial images.  Prominent thyroid gland with indistinct nodularity noted. The patient has had prior thyroid ultrasound workups.  There is low-grade stranding in the right supraclavicular region, of uncertain significance and etiology, as shown on axial images 59-74 of series 10.  IMPRESSION: 1. No acute intracranial findings. Remote cerebellar and left basal ganglia infarcts is. Remote left frontoparietal infarct. 2. Low grade stranding/edema in the right supraclavicular region, without cervical spine abnormality or other specific cause for this low grade stranding observed. 3. Scattered tooht decay, particularly of tooth 20. 9. No facial fracture. No cervical spine fracture or cervical spine subluxation.   Electronically Signed   By: Sherryl Barters M.D.   On: 06/14/2013 15:27   Dg Knee Complete 4 Views Right  06/14/2013   CLINICAL DATA:  Right anterior knee pain status post fall with visible soft tissue swelling anteriorly.  EXAM: RIGHT KNEE - COMPLETE 4+ VIEW  COMPARISON:  None.  FINDINGS: The bones of the right knee are osteopenic diffusely. There is abnormal appearance of the proximal fibular metadiaphysis. This may reflect an acute or old fracture. The patella appears intact and normally positioned. A small amount of soft tissue swelling anteriorly is suspected. There are vascular calcifications.  IMPRESSION: The distal femur, the patella, and the proximal  tibia appear intact. There is no abnormal appearance of the meta diaphysis of the adjacent fibula. This may reflect an acute or healing fracture. There is diffuse osteopenia. Correlation with the patient's clinical examination is needed. CT scanning is available upon request.   Electronically Signed   By: David  [name]   On: 06/14/2013 14:23   Ct Maxillofacial Wo Cm  06/14/2013   CLINICAL DATA:  Fall in bathtub last night, found still in tub this morning. Trauma and neck pain.  EXAM: CT HEAD WITHOUT CONTRAST  CT MAXILLOFACIAL WITHOUT CONTRAST  CT CERVICAL SPINE WITHOUT CONTRAST  TECHNIQUE: Multidetector CT imaging of the head, cervical spine, and maxillofacial structures were performed using the standard protocol without intravenous contrast. Multiplanar CT image reconstructions of the cervical spine and maxillofacial structures were also generated.  COMPARISON:  03/23/2012  FINDINGS: CT HEAD FINDINGS  Small remote lacunar infarcts in both cerebellar hemispheres. These are similar to prior. Brainstem, thalami, right basal ganglia, and basilar cisterns unremarkable.  Remote infarct of the left basal ganglia with punctate dystrophic internal calcification along the infarct margin. Remote left frontoparietal infarct, similar appearance to prior, with considerable thinning of the left frontal periventricular white matter.  Atherosclerotic calcification of the carotid siphons. No intracranial hemorrhage, mass lesion, or acute CVA.  CT MAXILLOFACIAL FINDINGS  There is degenerative spurring of both mandibular condyles. No facial fracture observed. No acute intra orbital findings. Dental cavity of tooth 29. Scattered tooth decay.  CT CERVICAL SPINE FINDINGS  Suspected small bone islands in the spinous process of C5 and potentially along the posterior superior endplate of T1. This could reflect incidental osteopoikilosis.  No cervical spine fracture or subluxation is observed. No vertebral subluxation is observed. No  prevertebral soft tissue swelling. Bone island noted in the right 2nd rib posteriorly on the axial images.  Prominent thyroid gland with indistinct nodularity noted. The patient has had prior thyroid ultrasound workups.  There is low-grade stranding in the right supraclavicular region, of uncertain significance and etiology, as shown on axial images 59-74 of series 10.  IMPRESSION: 1. No acute intracranial findings. Remote cerebellar and left basal ganglia infarcts is. Remote left frontoparietal infarct. 2. Low grade stranding/edema in the right supraclavicular region, without cervical spine abnormality or other specific cause for this low grade stranding observed. 3. Scattered tooht decay, particularly of tooth 20. 9. No facial fracture. No cervical spine fracture or cervical spine subluxation.   Electronically Signed   By: Sherryl Barters M.D.   On: 06/14/2013 15:27    EKG Interpretation   None      3:51 PM Discussed case with Dr. Nicole Kindred who will consult on the case. Plan to admit to medicine.   MDM  No diagnosis found.  Patient presents after being found in bathtub. Last seen normal at noon yesterday. CK of 2232. Patient needs admission for rhabdomyolysis and stroke workup.  Discussed case Dr. Tana Coast who will admit patient. Admission appreciated. Vital signs stable at this time.  Dr. Tawnya Crook evaluated the patient and agrees with plan.  Elwyn Lade, PA-C 06/14/13 1622

## 2013-06-14 NOTE — ED Notes (Addendum)
Patient transported to CT/Xray. 

## 2013-06-14 NOTE — ED Notes (Signed)
Pt c/o pain & noted to have abrasion to right elbow, echymosis to right shoulder, right hip & thoracic area. Family reports pt "a little confused" per normal. Pt denies LOC. Pt stated she fell into tub last night until she was found today. Pt stated she was not using her cane last night. Family reports she has BUE, BLE weakness but right side much weaker than left, uses cane to ambulate & refused to use a walker

## 2013-06-14 NOTE — ED Notes (Addendum)
Pt fell into bathtub last night & unable to get out due to weakness. Found by daughter this morning still in tub. Denies LOC. No obvious deformities/injuries. Pt c/o being stiff all over

## 2013-06-14 NOTE — Consult Note (Signed)
Referring Physician: Tawnya Crook    Chief Complaint: stroke  HPI:                                                                                                                                         Connie Ruiz is an 53 y.o. female history of antiphospholipid antibody syndrome, renal transplant on immunosuppressants, history of recurrent CVA, history of recurrent DVT and history of pulmonary embolism, diabetes mellitus type 2 present to the ER after her aid found her this am in her bath tube.  She was last seen normal yesterday and apparently fell at some pint last night in her bath tub.  She is having difficulty getting full sentences out thus much of history is obtained from the chart. Per aid and family members, at baseline she had a left facial droop, broken speech and right arm and leg weakness--however her speech seems to be much worse and her right facial droop and right sided weakness seems worse. She is on chronic coumadin due to antiphospholipid antibody syndrome.  INR pending.  Initial CT head negative for acute stroke.    Date last known well: Date: 06/13/2013 Time last known well: Unable to determine tPA Given: No: out of window  Past Medical History  Diagnosis Date  . THYROID NODULE, LEFT 04/10/2009  . DIABETES MELLITUS, TYPE II 08/21/2007  . HYPERLIPIDEMIA 08/21/2007  . GOUT 08/21/2007  . HYPERTENSION 08/21/2007    Dr. Andree Elk, [place] 08/21/2007  . CVA 04/17/2010  . CEREBROVASCULAR ACCIDENT, ACUTE 04/15/2010  . GERD 08/21/2007  . RENAL INSUFFICIENCY 08/21/2007  . LUPUS 08/21/2007  . OSTEOPOROSIS 08/21/2007    Rheumatol at [facility]  . DVT, HX OF 08/21/2007  . CLOSTRIDIUM DIFFICILE COLITIS, HX OF 08/21/2007  . KIDNEY TRANSPLANTATION, HX OF 08/22/2007    s/p renal transplant-Dr. Andree Elk, [facility]  . Pulmonary embolism 07/16/2010  . Renal failure   . Current use of long term anticoagulation     Dr. Andree Elk, [facility]  . Depression     Dr. Andree Elk, [facility]  . History  of stroke with residual effects   . Right sided weakness     Past Surgical History  Procedure Laterality Date  . Cholecystectomy    . Tubal ligation    . Kidney transplant Right 2009  . Renal biopsy, open  1981    Family History  Problem Relation Age of Onset  . Cancer Neg Hx   . Heart attack Mother   . Heart disease Father    Social History:  reports that she has never smoked. She does not have any smokeless tobacco history on file. She reports that she does not drink alcohol or use illicit drugs.  Allergies:  Allergies  Allergen Reactions  . Oxycodone-Acetaminophen Shortness Of Breath and Nausea Only  . Propoxyphene N-Acetaminophen Shortness Of Breath and Nausea Only  . Sulfonamide Derivatives Shortness Of  Breath and Nausea Only  . Codeine     REACTION: nausea  . Latex     Medications:                                                                                                                           No current facility-administered medications for this encounter.   Current Outpatient Prescriptions  Medication Sig Dispense Refill  . calcitRIOL (ROCALTROL) 0.25 MCG capsule Take 0.25 mcg by mouth daily.      Marland Kitchen diltiazem (TIAZAC) 180 MG 24 hr capsule Take 180 mg by mouth daily.      Marland Kitchen esomeprazole (NEXIUM) 20 MG capsule Take 20 mg by mouth daily at 12 noon.      . folic acid (FOLVITE) 1 MG tablet Take 1 mg by mouth daily.      Marland Kitchen glimepiride (AMARYL) 1 MG tablet Take 1 mg by mouth daily with breakfast.      . glucose blood test strip 1 each by Other route as needed for other. Use as instructed      . HYDROcodone-acetaminophen (NORCO/VICODIN) 5-325 MG per tablet Take 1 tablet by mouth every 6 (six) hours as needed for moderate pain.      Marland Kitchen losartan (COZAAR) 50 MG tablet Take 50 mg by mouth daily.      . mycophenolate (CELLCEPT) 250 MG capsule Take 250 mg by mouth 2 (two) times daily.      Marland Kitchen nystatin (MYCOSTATIN) powder Apply topically 4 (four) times daily.      .  predniSONE (DELTASONE) 1 MG tablet Take 1 mg by mouth daily with breakfast.      . sertraline (ZOLOFT) 100 MG tablet Take 100 mg by mouth daily.      . simvastatin (ZOCOR) 20 MG tablet Take 20 mg by mouth daily.      . solifenacin (VESICARE) 10 MG tablet Take by mouth daily.      . tacrolimus (PROGRAF) 0.5 MG capsule Take 0.5 mg by mouth 2 (two) times daily.      Marland Kitchen warfarin (COUMADIN) 5 MG tablet Take 5-7.5 mg by mouth daily. Take 7.5 mg on Monday and Friday.  Take 5 mg on Tuesday, Wednesday, Thursday, Saturday, and Sunday.         ROS:  History obtained from family  General ROS: negative for - chills, fatigue, fever, night sweats, weight gain or weight loss Psychological ROS: negative for - behavioral disorder, hallucinations, memory difficulties, mood swings or suicidal ideation Ophthalmic ROS: negative for - blurry vision, double vision, eye pain or loss of vision ENT ROS: negative for - epistaxis, nasal discharge, oral lesions, sore throat, tinnitus or vertigo Allergy and Immunology ROS: negative for - hives or itchy/watery eyes Hematological and Lymphatic ROS: negative for - bleeding problems, bruising or swollen lymph nodes Endocrine ROS: negative for - galactorrhea, hair pattern changes, polydipsia/polyuria or temperature intolerance Respiratory ROS: negative for - cough, hemoptysis, shortness of breath or wheezing Cardiovascular ROS: negative for - chest pain, dyspnea on exertion, edema or irregular heartbeat Gastrointestinal ROS: negative for - abdominal pain, diarrhea, hematemesis, nausea/vomiting or stool incontinence Genito-Urinary ROS: negative for - dysuria, hematuria, incontinence or urinary frequency/urgency Musculoskeletal ROS: negative for - joint swelling or muscular weakness Neurological ROS: as noted in HPI Dermatological ROS: negative for  rash and skin lesion changes  Neurologic Examination:                                                                                                      Blood pressure 146/91, pulse 113, temperature 98 F (36.7 C), temperature source Oral, resp. rate 22, SpO2 99.00%.   Mental Status: Alert, oriented,.  Speech shows expressive aphasia--at baseline she has expressive difficulties but now not able to speak in full sentences.  Able to follow 2 step commands without difficulty. Cranial Nerves: II: Discs flat bilaterally; Visual fields shows decreased , pupils equal, round, reactive to light and accommodation III,IV, VI: ptosis not present, extra-ocular motions intact bilaterally V,VII: smile asymmetric on the right, facial light touch sensation decreased on the right VIII: hearing normal bilaterally IX,X: gag reflex present XI: bilateral shoulder shrug--limited due to pain in right shoulder XII: midline tongue extension without atrophy or fasciculations  Motor: Right : Upper extremity   3/5--limited by pain     Left:     Upper extremity   5/5  Lower extremity   2/5--limited by pain Cannot lift of the bed   Lower extremity   5/5 --she is able lift leg off the bed 5 inches but cannot hold her right leg off the bed, left grip is weak but strength difficult to assess due to significant pain and bruising.   Tone and bulk:normal tone throughout; no atrophy noted Sensory: Pinprick and light touch intact throughout, bilaterally Deep Tendon Reflexes:  Right: Upper Extremity   Left: Upper extremity   biceps (C-5 to C-6) 2/4   biceps (C-5 to C-6) 2/4 tricep (C7) 2/4    triceps (C7) 2/4 Brachioradialis (C6) 2/4  Brachioradialis (C6) 2/4  Lower Extremity Lower Extremity  quadriceps (L-2 to L-4) 2/4   quadriceps (L-2 to L-4) 2/4 Achilles (S1) 0/4   Achilles (S1) 0/4  Plantars: Right: downgoing   Left: downgoing Cerebellar: normal finger-to-nose on the left--unable to assess on the right due to  pain and weakness,  Unable to assess heel-to-shin test Gait:  unable to assess. CV: pulses palpable throughout    Results for orders placed during the hospital encounter of 06/14/13 (from the past 48 hour(s))  CBC WITH DIFFERENTIAL     Status: Abnormal   Collection Time    06/14/13  1:03 PM      Result Value Range   WBC 12.4 (*) 4.0 - 10.5 K/uL   RBC 4.61  3.87 - 5.11 MIL/uL   Hemoglobin 13.6  12.0 - 15.0 g/dL   HCT 40.5  36.0 - 46.0 %   MCV 87.9  78.0 - 100.0 fL   MCH 29.5  26.0 - 34.0 pg   MCHC 33.6  30.0 - 36.0 g/dL   RDW 13.9  11.5 - 15.5 %   Platelets 157  150 - 400 K/uL   Neutrophils Relative % 80 (*) 43 - 77 %   Neutro Abs 9.9 (*) 1.7 - 7.7 K/uL   Lymphocytes Relative 13  12 - 46 %   Lymphs Abs 1.6  0.7 - 4.0 K/uL   Monocytes Relative 7  3 - 12 %   Monocytes Absolute 0.9  0.1 - 1.0 K/uL   Eosinophils Relative 0  0 - 5 %   Eosinophils Absolute 0.0  0.0 - 0.7 K/uL   Basophils Relative 0  0 - 1 %   Basophils Absolute 0.0  0.0 - 0.1 K/uL  COMPREHENSIVE METABOLIC PANEL     Status: Abnormal   Collection Time    06/14/13  1:03 PM      Result Value Range   Sodium 141  137 - 147 mEq/L   Potassium 3.1 (*) 3.7 - 5.3 mEq/L   Chloride 102  96 - 112 mEq/L   CO2 23  19 - 32 mEq/L   Glucose, Bld 117 (*) 70 - 99 mg/dL   BUN 13  6 - 23 mg/dL   Creatinine, Ser 0.55  0.50 - 1.10 mg/dL   Calcium 9.5  8.4 - 10.5 mg/dL   Total Protein 7.4  6.0 - 8.3 g/dL   Albumin 3.3 (*) 3.5 - 5.2 g/dL   AST 67 (*) 0 - 37 U/L   ALT 25  0 - 35 U/L   Alkaline Phosphatase 74  39 - 117 U/L   Total Bilirubin 1.0  0.3 - 1.2 mg/dL   GFR calc non Af Amer >90  >90 mL/min   GFR calc Af Amer >90  >90 mL/min   Comment: (NOTE)     The eGFR has been calculated using the CKD EPI equation.     This calculation has not been validated in all clinical situations.     eGFR's persistently <90 mL/min signify possible Chronic Kidney     Disease.  LIPASE, BLOOD     Status: None   Collection Time    06/14/13  1:03 PM       Result Value Range   Lipase 17  11 - 59 U/L  CK     Status: Abnormal   Collection Time    06/14/13  1:03 PM      Result Value Range   Total CK 2232 (*) 7 - 177 U/L  POCT I-STAT TROPONIN I     Status: None   Collection Time    06/14/13  1:54 PM      Result Value Range   Troponin i, poc 0.02  0.00 - 0.08 ng/mL   Comment 3            Comment: Due to  the release kinetics of cTnI,     a negative result within the first hours     of the onset of symptoms does not rule out     myocardial infarction with certainty.     If myocardial infarction is still suspected,     repeat the test at appropriate intervals.  CG4 I-STAT (LACTIC ACID)     Status: None   Collection Time    06/14/13  1:57 PM      Result Value Range   Lactic Acid, Venous 1.94  0.5 - 2.2 mmol/L   Dg Thoracic Spine 2 View  06/14/2013   CLINICAL DATA:  Fall and back pain.  EXAM: THORACIC SPINE - 2 VIEW  COMPARISON:  Chest radiograph 10/27/2012  FINDINGS: The bone detail is limited on the lateral view and swimmer's view. The alignment of the thoracic spine and cervical-thoracic spine appear to be within normal limits. The vertebral body heights are grossly intact. Limited evaluation of the thoracolumbar junction on the lateral view.  IMPRESSION: The bone detail is limited on this examination but the alignment is grossly normal.   Electronically Signed   By: Markus Daft M.D.   On: 06/14/2013 14:23   Dg Shoulder Right  06/14/2013   CLINICAL DATA:  Fall and right shoulder pain.  EXAM: RIGHT SHOULDER - 2+ VIEW  COMPARISON:  None.  FINDINGS: Two views of the right shoulder were obtained. The scapular Y-view is limited but there no gross dislocation. No evidence for an acute fracture. AC joint is grossly intact.  IMPRESSION: Limited examination without gross abnormality.   Electronically Signed   By: Markus Daft M.D.   On: 06/14/2013 14:25   Dg Hip Complete Right  06/14/2013   CLINICAL DATA:  Fall and right hip pain.  EXAM: RIGHT HIP -  COMPLETE 2+ VIEW  COMPARISON:  CT 08/25/2011  FINDINGS: Again noted are multiple soft tissue calcifications in the lower abdomen. The pelvic bony ring is intact. Right hip is located without acute fracture.  IMPRESSION: No acute bone abnormality to the pelvis or right hip.   Electronically Signed   By: Markus Daft M.D.   On: 06/14/2013 14:26   Ct Head Wo Contrast  06/14/2013   CLINICAL DATA:  Fall in bathtub last night, found still in tub this morning. Trauma and neck pain.  EXAM: CT HEAD WITHOUT CONTRAST  CT MAXILLOFACIAL WITHOUT CONTRAST  CT CERVICAL SPINE WITHOUT CONTRAST  TECHNIQUE: Multidetector CT imaging of the head, cervical spine, and maxillofacial structures were performed using the standard protocol without intravenous contrast. Multiplanar CT image reconstructions of the cervical spine and maxillofacial structures were also generated.  COMPARISON:  03/23/2012  FINDINGS: CT HEAD FINDINGS  Small remote lacunar infarcts in both cerebellar hemispheres. These are similar to prior. Brainstem, thalami, right basal ganglia, and basilar cisterns unremarkable.  Remote infarct of the left basal ganglia with punctate dystrophic internal calcification along the infarct margin. Remote left frontoparietal infarct, similar appearance to prior, with considerable thinning of the left frontal periventricular white matter.  Atherosclerotic calcification of the carotid siphons. No intracranial hemorrhage, mass lesion, or acute CVA.  CT MAXILLOFACIAL FINDINGS  There is degenerative spurring of both mandibular condyles. No facial fracture observed. No acute intra orbital findings. Dental cavity of tooth 29. Scattered tooth decay.  CT CERVICAL SPINE FINDINGS  Suspected small bone islands in the spinous process of C5 and potentially along the posterior superior endplate of T1. This could reflect incidental osteopoikilosis.  No  cervical spine fracture or subluxation is observed. No vertebral subluxation is observed. No  prevertebral soft tissue swelling. Bone island noted in the right 2nd rib posteriorly on the axial images.  Prominent thyroid gland with indistinct nodularity noted. The patient has had prior thyroid ultrasound workups.  There is low-grade stranding in the right supraclavicular region, of uncertain significance and etiology, as shown on axial images 59-74 of series 10.  IMPRESSION: 1. No acute intracranial findings. Remote cerebellar and left basal ganglia infarcts is. Remote left frontoparietal infarct. 2. Low grade stranding/edema in the right supraclavicular region, without cervical spine abnormality or other specific cause for this low grade stranding observed. 3. Scattered tooht decay, particularly of tooth 20. 9. No facial fracture. No cervical spine fracture or cervical spine subluxation.   Electronically Signed   By: Sherryl Barters M.D.   On: 06/14/2013 15:27   Ct Cervical Spine Wo Contrast  06/14/2013   CLINICAL DATA:  Fall in bathtub last night, found still in tub this morning. Trauma and neck pain.  EXAM: CT HEAD WITHOUT CONTRAST  CT MAXILLOFACIAL WITHOUT CONTRAST  CT CERVICAL SPINE WITHOUT CONTRAST  TECHNIQUE: Multidetector CT imaging of the head, cervical spine, and maxillofacial structures were performed using the standard protocol without intravenous contrast. Multiplanar CT image reconstructions of the cervical spine and maxillofacial structures were also generated.  COMPARISON:  03/23/2012  FINDINGS: CT HEAD FINDINGS  Small remote lacunar infarcts in both cerebellar hemispheres. These are similar to prior. Brainstem, thalami, right basal ganglia, and basilar cisterns unremarkable.  Remote infarct of the left basal ganglia with punctate dystrophic internal calcification along the infarct margin. Remote left frontoparietal infarct, similar appearance to prior, with considerable thinning of the left frontal periventricular white matter.  Atherosclerotic calcification of the carotid siphons. No  intracranial hemorrhage, mass lesion, or acute CVA.  CT MAXILLOFACIAL FINDINGS  There is degenerative spurring of both mandibular condyles. No facial fracture observed. No acute intra orbital findings. Dental cavity of tooth 29. Scattered tooth decay.  CT CERVICAL SPINE FINDINGS  Suspected small bone islands in the spinous process of C5 and potentially along the posterior superior endplate of T1. This could reflect incidental osteopoikilosis.  No cervical spine fracture or subluxation is observed. No vertebral subluxation is observed. No prevertebral soft tissue swelling. Bone island noted in the right 2nd rib posteriorly on the axial images.  Prominent thyroid gland with indistinct nodularity noted. The patient has had prior thyroid ultrasound workups.  There is low-grade stranding in the right supraclavicular region, of uncertain significance and etiology, as shown on axial images 59-74 of series 10.  IMPRESSION: 1. No acute intracranial findings. Remote cerebellar and left basal ganglia infarcts is. Remote left frontoparietal infarct. 2. Low grade stranding/edema in the right supraclavicular region, without cervical spine abnormality or other specific cause for this low grade stranding observed. 3. Scattered tooht decay, particularly of tooth 20. 9. No facial fracture. No cervical spine fracture or cervical spine subluxation.   Electronically Signed   By: Sherryl Barters M.D.   On: 06/14/2013 15:27   Dg Knee Complete 4 Views Right  06/14/2013   CLINICAL DATA:  Right anterior knee pain status post fall with visible soft tissue swelling anteriorly.  EXAM: RIGHT KNEE - COMPLETE 4+ VIEW  COMPARISON:  None.  FINDINGS: The bones of the right knee are osteopenic diffusely. There is abnormal appearance of the proximal fibular metadiaphysis. This may reflect an acute or old fracture. The patella appears intact and normally positioned. A  small amount of soft tissue swelling anteriorly is suspected. There are vascular  calcifications.  IMPRESSION: The distal femur, the patella, and the proximal tibia appear intact. There is no abnormal appearance of the meta diaphysis of the adjacent fibula. This may reflect an acute or healing fracture. There is diffuse osteopenia. Correlation with the patient's clinical examination is needed. CT scanning is available upon request.   Electronically Signed   By: David  [name]   On: 06/14/2013 14:23   Ct Maxillofacial Wo Cm  06/14/2013   CLINICAL DATA:  Fall in bathtub last night, found still in tub this morning. Trauma and neck pain.  EXAM: CT HEAD WITHOUT CONTRAST  CT MAXILLOFACIAL WITHOUT CONTRAST  CT CERVICAL SPINE WITHOUT CONTRAST  TECHNIQUE: Multidetector CT imaging of the head, cervical spine, and maxillofacial structures were performed using the standard protocol without intravenous contrast. Multiplanar CT image reconstructions of the cervical spine and maxillofacial structures were also generated.  COMPARISON:  03/23/2012  FINDINGS: CT HEAD FINDINGS  Small remote lacunar infarcts in both cerebellar hemispheres. These are similar to prior. Brainstem, thalami, right basal ganglia, and basilar cisterns unremarkable.  Remote infarct of the left basal ganglia with punctate dystrophic internal calcification along the infarct margin. Remote left frontoparietal infarct, similar appearance to prior, with considerable thinning of the left frontal periventricular white matter.  Atherosclerotic calcification of the carotid siphons. No intracranial hemorrhage, mass lesion, or acute CVA.  CT MAXILLOFACIAL FINDINGS  There is degenerative spurring of both mandibular condyles. No facial fracture observed. No acute intra orbital findings. Dental cavity of tooth 29. Scattered tooth decay.  CT CERVICAL SPINE FINDINGS  Suspected small bone islands in the spinous process of C5 and potentially along the posterior superior endplate of T1. This could reflect incidental osteopoikilosis.  No cervical spine  fracture or subluxation is observed. No vertebral subluxation is observed. No prevertebral soft tissue swelling. Bone island noted in the right 2nd rib posteriorly on the axial images.  Prominent thyroid gland with indistinct nodularity noted. The patient has had prior thyroid ultrasound workups.  There is low-grade stranding in the right supraclavicular region, of uncertain significance and etiology, as shown on axial images 59-74 of series 10.  IMPRESSION: 1. No acute intracranial findings. Remote cerebellar and left basal ganglia infarcts is. Remote left frontoparietal infarct. 2. Low grade stranding/edema in the right supraclavicular region, without cervical spine abnormality or other specific cause for this low grade stranding observed. 3. Scattered tooht decay, particularly of tooth 20. 9. No facial fracture. No cervical spine fracture or cervical spine subluxation.   Electronically Signed   By: Sherryl Barters M.D.   On: 06/14/2013 15:27    Assessment and plan discussed with with attending physician and they are in agreement.    Etta Quill PA-C [facility] [phone]  06/14/2013, 4:24 PM   Assessment: 53 y.o. female presenting with increased right facial droop, expressive aphasia and right arm and leg weakness.  Patient was found down after prolonged period on bath tube floor due to inability to get up.  INR is pending.  With history of multiple infarcts and risk factors she has likely suffered acute left CVA, however cannot fully exclude seizure given extensive bruising and uncertain period of time which is unaccountable.    Stroke Risk Factors - hyperlipidemia, hypertension and CVA antiphospholipid antibody  Recommend:  1) Stroke work up including MRI/MRA head, carotid doppler, FLD, A1c and 2 D echo.  2) EEG 3) If INR is sub therapeutic would bridge  with 81 mg ASA.   I personally participate in this patient's evaluation and management, including formulating the above  clinical impression and management recommendations.   Rush Farmer M.D. [facility] [phone]

## 2013-06-14 NOTE — H&P (Signed)
History and Physical       Hospital Admission Note Date: 06/14/2013  Patient name: Connie Ruiz Medical record number: [id number] Date of birth: Mar 10, 1961 Age: 53 y.o. Gender: female  PCP: Renato Shin, MD    Chief Complaint:  Found by the daughter in the bathtub at 26 AM  HPI: Patient is a 53 year old female with history of prior stroke with right-sided residual weakness, history of antiphospholipid syndrome on Coumadin, renal transplant on immunosuppressants, recurrent DVT, PE, diabetes was brought to the ER by her family after found in the bathtub today, where she was lying whole night. History was obtained from the patient's daughter who reported that she was last seen normal around noon yesterday. The daughter called the patient who lives alone several times this morning on phone however she did not pick up. Subsequently she and her home health aide found her lying in the bathtub. Been trying to obtain history from the patient she states that she went should the bathtub to take that around 3 PM but she was too weak to get up. Per daughter there was no water in the bathtub. Family members noticed that she does have left-sided facial droop at baseline which is worse, they also feel her right arm and leg weakness is also worsened along with the speech/dysarthria. CT head is negative for any acute stroke INR is still pending.  Review of Systems:  Patient is somewhat confused and unable to provide much history herself.  Past Medical History: Past Medical History  Diagnosis Date  . THYROID NODULE, LEFT 04/10/2009  . DIABETES MELLITUS, TYPE II 08/21/2007  . HYPERLIPIDEMIA 08/21/2007  . GOUT 08/21/2007  . HYPERTENSION 08/21/2007    Dr. Andree Elk, [place] 08/21/2007  . CVA 04/17/2010  . CEREBROVASCULAR ACCIDENT, ACUTE 04/15/2010  . GERD 08/21/2007  . RENAL INSUFFICIENCY 08/21/2007  . LUPUS 08/21/2007  . OSTEOPOROSIS  08/21/2007    Rheumatol at [facility]  . DVT, HX OF 08/21/2007  . CLOSTRIDIUM DIFFICILE COLITIS, HX OF 08/21/2007  . KIDNEY TRANSPLANTATION, HX OF 08/22/2007    s/p renal transplant-Dr. Andree Elk, [facility]  . Pulmonary embolism 07/16/2010  . Renal failure   . Current use of long term anticoagulation     Dr. Andree Elk, [facility]  . Depression     Dr. Andree Elk, [facility]  . History of stroke with residual effects   . Right sided weakness    Past Surgical History  Procedure Laterality Date  . Cholecystectomy    . Tubal ligation    . Kidney transplant Right 2009  . Renal biopsy, open  1981    Medications: Prior to Admission medications   Medication Sig Start Date End Date Taking? Authorizing Provider  calcitRIOL (ROCALTROL) 0.25 MCG capsule Take 0.25 mcg by mouth daily.   Yes Historical Provider, MD  diltiazem (TIAZAC) 180 MG 24 hr capsule Take 180 mg by mouth daily.   Yes Historical Provider, MD  esomeprazole (NEXIUM) 20 MG capsule Take 20 mg by mouth daily at 12 noon.   Yes Historical Provider, MD  folic acid (FOLVITE) 1 MG tablet Take 1 mg by mouth daily.   Yes Historical Provider, MD  glimepiride (AMARYL) 1 MG tablet Take 1 mg by mouth daily with breakfast.   Yes Historical Provider, MD  glucose blood test strip 1 each by Other route as needed for other. Use as instructed   Yes Historical Provider, MD  HYDROcodone-acetaminophen (NORCO/VICODIN) 5-325 MG per tablet Take 1 tablet by mouth every 6 (  six) hours as needed for moderate pain.   Yes Historical Provider, MD  losartan (COZAAR) 50 MG tablet Take 50 mg by mouth daily.   Yes Historical Provider, MD  mycophenolate (CELLCEPT) 250 MG capsule Take 250 mg by mouth 2 (two) times daily.   Yes Historical Provider, MD  nystatin (MYCOSTATIN) powder Apply topically 4 (four) times daily.   Yes Historical Provider, MD  predniSONE (DELTASONE) 1 MG tablet Take 1 mg by mouth daily with breakfast.   Yes Historical Provider, MD  sertraline (ZOLOFT) 100 MG tablet Take  100 mg by mouth daily.   Yes Historical Provider, MD  simvastatin (ZOCOR) 20 MG tablet Take 20 mg by mouth daily.   Yes Historical Provider, MD  solifenacin (VESICARE) 10 MG tablet Take by mouth daily.   Yes Historical Provider, MD  tacrolimus (PROGRAF) 0.5 MG capsule Take 0.5 mg by mouth 2 (two) times daily.   Yes Historical Provider, MD  warfarin (COUMADIN) 5 MG tablet Take 5-7.5 mg by mouth daily. Take 7.5 mg on Monday and Friday.  Take 5 mg on Tuesday, Wednesday, Thursday, Saturday, and Sunday.   Yes Historical Provider, MD    Allergies:   Allergies  Allergen Reactions  . Oxycodone-Acetaminophen Shortness Of Breath and Nausea Only  . Propoxyphene N-Acetaminophen Shortness Of Breath and Nausea Only  . Sulfonamide Derivatives Shortness Of Breath and Nausea Only  . Codeine     REACTION: nausea  . Latex     Social History:  reports that she has never smoked. She does not have any smokeless tobacco history on file. She reports that she does not drink alcohol or use illicit drugs.  Family History: Family History  Problem Relation Age of Onset  . Cancer Neg Hx   . Heart attack Mother   . Heart disease Father     Physical Exam: Blood pressure 146/91, pulse 113, temperature 98 F (36.7 C), temperature source Oral, resp. rate 22, SpO2 99.00%. General: Alert, awake, some expressive aphasia, in no acute distress. She is able to follow commands, moaning with pain in her right shoulder HEENT: normocephalic, atraumatic, anicteric sclera, pink conjunctiva, pupils equal and reactive to light and accomodation, oropharynx clear Neck: supple, no masses or lymphadenopathy, no goiter, no bruits  Heart: Regular rate and rhythm, without murmurs, rubs or gallops. Lungs: Clear to auscultation bilaterally, no wheezing, rales or rhonchi. Abdomen: Soft, nontender, nondistended, positive bowel sounds, no masses. Extremities: No clubbing, cyanosis or edema with positive pedal pulses. Erythema and redness  on the right shoulder Neuro: Right upper extremity 3/5 lower extremity 2/5, also worsened due to pain. Left upper and lower extremities 5/5 Psych: alert, some expressive difficulty Skin: Dry skin over the legs, redness and erythema on the right shoulder with pain   LABS on Admission:  Basic Metabolic Panel:  Recent Labs Lab 06/14/13 1303  NA 141  K 3.1*  CL 102  CO2 23  GLUCOSE 117*  BUN 13  CREATININE 0.55  CALCIUM 9.5   Liver Function Tests:  Recent Labs Lab 06/14/13 1303  AST 67*  ALT 25  ALKPHOS 74  BILITOT 1.0  PROT 7.4  ALBUMIN 3.3*    Recent Labs Lab 06/14/13 1303  LIPASE 17   No results found for this basename: AMMONIA,  in the last 168 hours CBC:  Recent Labs Lab 06/14/13 1303  WBC 12.4*  NEUTROABS 9.9*  HGB 13.6  HCT 40.5  MCV 87.9  PLT 157   Cardiac Enzymes:  Recent Labs Lab  06/14/13 1303  CKTOTAL 2232*   BNP: No components found with this basename: POCBNP,  CBG: No results found for this basename: GLUCAP,  in the last 168 hours   Radiological Exams on Admission: Dg Thoracic Spine 2 View  06/14/2013   CLINICAL DATA:  Fall and back pain.  EXAM: THORACIC SPINE - 2 VIEW  COMPARISON:  Chest radiograph 10/27/2012  FINDINGS: The bone detail is limited on the lateral view and swimmer's view. The alignment of the thoracic spine and cervical-thoracic spine appear to be within normal limits. The vertebral body heights are grossly intact. Limited evaluation of the thoracolumbar junction on the lateral view.  IMPRESSION: The bone detail is limited on this examination but the alignment is grossly normal.   Electronically Signed   By: Markus Daft M.D.   On: 06/14/2013 14:23   Dg Shoulder Right  06/14/2013   CLINICAL DATA:  Fall and right shoulder pain.  EXAM: RIGHT SHOULDER - 2+ VIEW  COMPARISON:  None.  FINDINGS: Two views of the right shoulder were obtained. The scapular Y-view is limited but there no gross dislocation. No evidence for an acute  fracture. AC joint is grossly intact.  IMPRESSION: Limited examination without gross abnormality.   Electronically Signed   By: Markus Daft M.D.   On: 06/14/2013 14:25   Dg Hip Complete Right  06/14/2013   CLINICAL DATA:  Fall and right hip pain.  EXAM: RIGHT HIP - COMPLETE 2+ VIEW  COMPARISON:  CT 08/25/2011  FINDINGS: Again noted are multiple soft tissue calcifications in the lower abdomen. The pelvic bony ring is intact. Right hip is located without acute fracture.  IMPRESSION: No acute bone abnormality to the pelvis or right hip.   Electronically Signed   By: Markus Daft M.D.   On: 06/14/2013 14:26   Ct Head Wo Contrast  06/14/2013   CLINICAL DATA:  Fall in bathtub last night, found still in tub this morning. Trauma and neck pain.  EXAM: CT HEAD WITHOUT CONTRAST  CT MAXILLOFACIAL WITHOUT CONTRAST  CT CERVICAL SPINE WITHOUT CONTRAST  TECHNIQUE: Multidetector CT imaging of the head, cervical spine, and maxillofacial structures were performed using the standard protocol without intravenous contrast. Multiplanar CT image reconstructions of the cervical spine and maxillofacial structures were also generated.  COMPARISON:  03/23/2012  FINDINGS: CT HEAD FINDINGS  Small remote lacunar infarcts in both cerebellar hemispheres. These are similar to prior. Brainstem, thalami, right basal ganglia, and basilar cisterns unremarkable.  Remote infarct of the left basal ganglia with punctate dystrophic internal calcification along the infarct margin. Remote left frontoparietal infarct, similar appearance to prior, with considerable thinning of the left frontal periventricular white matter.  Atherosclerotic calcification of the carotid siphons. No intracranial hemorrhage, mass lesion, or acute CVA.  CT MAXILLOFACIAL FINDINGS  There is degenerative spurring of both mandibular condyles. No facial fracture observed. No acute intra orbital findings. Dental cavity of tooth 29. Scattered tooth decay.  CT CERVICAL SPINE FINDINGS   Suspected small bone islands in the spinous process of C5 and potentially along the posterior superior endplate of T1. This could reflect incidental osteopoikilosis.  No cervical spine fracture or subluxation is observed. No vertebral subluxation is observed. No prevertebral soft tissue swelling. Bone island noted in the right 2nd rib posteriorly on the axial images.  Prominent thyroid gland with indistinct nodularity noted. The patient has had prior thyroid ultrasound workups.  There is low-grade stranding in the right supraclavicular region, of uncertain significance and etiology, as shown  on axial images 59-74 of series 10.  IMPRESSION: 1. No acute intracranial findings. Remote cerebellar and left basal ganglia infarcts is. Remote left frontoparietal infarct. 2. Low grade stranding/edema in the right supraclavicular region, without cervical spine abnormality or other specific cause for this low grade stranding observed. 3. Scattered tooht decay, particularly of tooth 20. 9. No facial fracture. No cervical spine fracture or cervical spine subluxation.   Electronically Signed   By: Sherryl Barters M.D.   On: 06/14/2013 15:27   Ct Cervical Spine Wo Contrast  06/14/2013   CLINICAL DATA:  Fall in bathtub last night, found still in tub this morning. Trauma and neck pain.  EXAM: CT HEAD WITHOUT CONTRAST  CT MAXILLOFACIAL WITHOUT CONTRAST  CT CERVICAL SPINE WITHOUT CONTRAST  TECHNIQUE: Multidetector CT imaging of the head, cervical spine, and maxillofacial structures were performed using the standard protocol without intravenous contrast. Multiplanar CT image reconstructions of the cervical spine and maxillofacial structures were also generated.  COMPARISON:  03/23/2012  FINDINGS: CT HEAD FINDINGS  Small remote lacunar infarcts in both cerebellar hemispheres. These are similar to prior. Brainstem, thalami, right basal ganglia, and basilar cisterns unremarkable.  Remote infarct of the left basal ganglia with punctate  dystrophic internal calcification along the infarct margin. Remote left frontoparietal infarct, similar appearance to prior, with considerable thinning of the left frontal periventricular white matter.  Atherosclerotic calcification of the carotid siphons. No intracranial hemorrhage, mass lesion, or acute CVA.  CT MAXILLOFACIAL FINDINGS  There is degenerative spurring of both mandibular condyles. No facial fracture observed. No acute intra orbital findings. Dental cavity of tooth 29. Scattered tooth decay.  CT CERVICAL SPINE FINDINGS  Suspected small bone islands in the spinous process of C5 and potentially along the posterior superior endplate of T1. This could reflect incidental osteopoikilosis.  No cervical spine fracture or subluxation is observed. No vertebral subluxation is observed. No prevertebral soft tissue swelling. Bone island noted in the right 2nd rib posteriorly on the axial images.  Prominent thyroid gland with indistinct nodularity noted. The patient has had prior thyroid ultrasound workups.  There is low-grade stranding in the right supraclavicular region, of uncertain significance and etiology, as shown on axial images 59-74 of series 10.  IMPRESSION: 1. No acute intracranial findings. Remote cerebellar and left basal ganglia infarcts is. Remote left frontoparietal infarct. 2. Low grade stranding/edema in the right supraclavicular region, without cervical spine abnormality or other specific cause for this low grade stranding observed. 3. Scattered tooht decay, particularly of tooth 20. 9. No facial fracture. No cervical spine fracture or cervical spine subluxation.   Electronically Signed   By: Sherryl Barters M.D.   On: 06/14/2013 15:27   US Breast Left  05/16/2013   CLINICAL DATA:  Patient notes a palpable mass within the medial left breast which is tender. The patient has a history of diabetes. There is no definite history of trauma in this region (per patient).  EXAM: DIGITAL DIAGNOSTIC   bilateral MAMMOGRAM WITH CAD  ULTRASOUND left BREAST  COMPARISON:  None.  ACR Breast Density Category a: The breast tissue is almost entirely fatty.  FINDINGS: The palpable abnormality within the medial left breast is located within the subcutaneous fat at approximately the 9 o'clock position and does contain fat density most consistent with an area of evolving fat necrosis. This measures 1.5 cm in size of mammography. There are dense dystrophic calcifications located within the central and medial right breast. There is no worrisome mass, distortion, or worrisome  calcification within either breast.  Mammographic images were processed with CAD.  On physical exam,there is a tender, firm, palpable mass located within the medial left breast at 9 o'clock position 7 cm from the nipple which by PHYSICAL EXAMINATION measures approximately 1.5 cm in size.  Ultrasound is performed, showing a heterogeneous mass with increased echogenicity and hypoechoic areas located within the left breast at the 9 o'clock position 7 cm from the nipple in a subdermal location. When correlating the ultrasound features with the mammographic features, most likely this represents an area of evolving fat necrosis. This measures 1.3 x 1.3 x 1.1 cm in size. I recommend followup left breast ultrasound in 3 months. The importance of breast self-examination was reviewed with the patient. She was instructed to contact the breast center if this area increases in size on physical examination.  IMPRESSION: 1.3 cm probable area of evolving fat necrosis located within the medial left breast at the 9 o'clock position. Recommend followup left breast ultrasound in 3 months.  RECOMMENDATION: Left breast ultrasound in 3 months.  I have discussed the findings and recommendations with the patient. Results were also provided in writing at the conclusion of the visit.  BI-RADS CATEGORY  3: Probably benign finding(s) - short interval follow-up suggested.    Electronically Signed   By: Luberta Robertson M.D.   On: 05/16/2013 17:34   Dg Knee Complete 4 Views Right  06/14/2013   CLINICAL DATA:  Right anterior knee pain status post fall with visible soft tissue swelling anteriorly.  EXAM: RIGHT KNEE - COMPLETE 4+ VIEW  COMPARISON:  None.  FINDINGS: The bones of the right knee are osteopenic diffusely. There is abnormal appearance of the proximal fibular metadiaphysis. This may reflect an acute or old fracture. The patella appears intact and normally positioned. A small amount of soft tissue swelling anteriorly is suspected. There are vascular calcifications.  IMPRESSION: The distal femur, the patella, and the proximal tibia appear intact. There is no abnormal appearance of the meta diaphysis of the adjacent fibula. This may reflect an acute or healing fracture. There is diffuse osteopenia. Correlation with the patient's clinical examination is needed. CT scanning is available upon request.   Electronically Signed   By: David  [name]   On: 06/14/2013 14:23   Mm Digital Diagnostic Bilat  05/16/2013   CLINICAL DATA:  Patient notes a palpable mass within the medial left breast which is tender. The patient has a history of diabetes. There is no definite history of trauma in this region (per patient).  EXAM: DIGITAL DIAGNOSTIC  bilateral MAMMOGRAM WITH CAD  ULTRASOUND left BREAST  COMPARISON:  None.  ACR Breast Density Category a: The breast tissue is almost entirely fatty.  FINDINGS: The palpable abnormality within the medial left breast is located within the subcutaneous fat at approximately the 9 o'clock position and does contain fat density most consistent with an area of evolving fat necrosis. This measures 1.5 cm in size of mammography. There are dense dystrophic calcifications located within the central and medial right breast. There is no worrisome mass, distortion, or worrisome calcification within either breast.  Mammographic images were processed with CAD.  On  physical exam,there is a tender, firm, palpable mass located within the medial left breast at 9 o'clock position 7 cm from the nipple which by PHYSICAL EXAMINATION measures approximately 1.5 cm in size.  Ultrasound is performed, showing a heterogeneous mass with increased echogenicity and hypoechoic areas located within the left breast at the 9 o'clock  position 7 cm from the nipple in a subdermal location. When correlating the ultrasound features with the mammographic features, most likely this represents an area of evolving fat necrosis. This measures 1.3 x 1.3 x 1.1 cm in size. I recommend followup left breast ultrasound in 3 months. The importance of breast self-examination was reviewed with the patient. She was instructed to contact the breast center if this area increases in size on physical examination.  IMPRESSION: 1.3 cm probable area of evolving fat necrosis located within the medial left breast at the 9 o'clock position. Recommend followup left breast ultrasound in 3 months.  RECOMMENDATION: Left breast ultrasound in 3 months.  I have discussed the findings and recommendations with the patient. Results were also provided in writing at the conclusion of the visit.  BI-RADS CATEGORY  3: Probably benign finding(s) - short interval follow-up suggested.   Electronically Signed   By: Luberta Robertson M.D.   On: 05/16/2013 17:34   Ct Maxillofacial Wo Cm  06/14/2013   CLINICAL DATA:  Fall in bathtub last night, found still in tub this morning. Trauma and neck pain.  EXAM: CT HEAD WITHOUT CONTRAST  CT MAXILLOFACIAL WITHOUT CONTRAST  CT CERVICAL SPINE WITHOUT CONTRAST  TECHNIQUE: Multidetector CT imaging of the head, cervical spine, and maxillofacial structures were performed using the standard protocol without intravenous contrast. Multiplanar CT image reconstructions of the cervical spine and maxillofacial structures were also generated.  COMPARISON:  03/23/2012  FINDINGS: CT HEAD FINDINGS  Small remote lacunar  infarcts in both cerebellar hemispheres. These are similar to prior. Brainstem, thalami, right basal ganglia, and basilar cisterns unremarkable.  Remote infarct of the left basal ganglia with punctate dystrophic internal calcification along the infarct margin. Remote left frontoparietal infarct, similar appearance to prior, with considerable thinning of the left frontal periventricular white matter.  Atherosclerotic calcification of the carotid siphons. No intracranial hemorrhage, mass lesion, or acute CVA.  CT MAXILLOFACIAL FINDINGS  There is degenerative spurring of both mandibular condyles. No facial fracture observed. No acute intra orbital findings. Dental cavity of tooth 29. Scattered tooth decay.  CT CERVICAL SPINE FINDINGS  Suspected small bone islands in the spinous process of C5 and potentially along the posterior superior endplate of T1. This could reflect incidental osteopoikilosis.  No cervical spine fracture or subluxation is observed. No vertebral subluxation is observed. No prevertebral soft tissue swelling. Bone island noted in the right 2nd rib posteriorly on the axial images.  Prominent thyroid gland with indistinct nodularity noted. The patient has had prior thyroid ultrasound workups.  There is low-grade stranding in the right supraclavicular region, of uncertain significance and etiology, as shown on axial images 59-74 of series 10.  IMPRESSION: 1. No acute intracranial findings. Remote cerebellar and left basal ganglia infarcts is. Remote left frontoparietal infarct. 2. Low grade stranding/edema in the right supraclavicular region, without cervical spine abnormality or other specific cause for this low grade stranding observed. 3. Scattered tooht decay, particularly of tooth 20. 9. No facial fracture. No cervical spine fracture or cervical spine subluxation.   Electronically Signed   By: Sherryl Barters M.D.   On: 06/14/2013 15:27    Assessment/Plan Principal Problem:   Acute  encephalopathy with a worsened right-sided weakness, history of prior multiple strokes - Admit for full stroke workup also obtain INR, UA to rule out UTI -Obtain MRI of the brain, MRA, 2-D echo, carotid Doppler - N.p.o. for swallowing evaluation, and PT/OT/ST evaluation -Hemoglobin A1c, lipid panel - Coumadin to be started  after INR is obtained. Neurology has been consulted we'll follow recommendations.    Active Problems:   HYPERLIPIDEMIA - Obtain lipid panel, continue Zocor     HYPERTENSION - Continue Cardizem     Type II or unspecified type diabetes mellitus with renal manifestations, uncontrolled(250.42) - Continue sliding scale insulin, obtain hemoglobin A1c     Rhabdomyolysis - Placed on IV fluid hydration   Hypokalemia: Placed on potassium replacement  History of renal transplant - Continue prednisone, Prograf, CellCept  DVT prophylaxis:  on Coumadin, INR pending   CODE STATUS:  full code   Family Communication: Admission, patients condition and plan of care including tests being ordered have been discussed with the patient and family (daughter, brother, sister-in-law) who indicates understanding and agree with the plan and Code Status   Further plan will depend as patient's clinical course evolves and further radiologic and laboratory data become available.   Time Spent on Admission: 1 hour  Derrious Bologna M.D. [facility] 06/14/2013, 4:48 PM Pager: [phone]  If 7PM-7AM, please contact night-coverage [email or web address] Password [email or web address]

## 2013-06-15 ENCOUNTER — Observation Stay (HOSPITAL_COMMUNITY): Payer: PRIVATE HEALTH INSURANCE

## 2013-06-15 DIAGNOSIS — I519 Heart disease, unspecified: Secondary | ICD-10-CM

## 2013-06-15 LAB — CBC
HCT: 36.3 % (ref 36.0–46.0)
Hemoglobin: 12 g/dL (ref 12.0–15.0)
MCH: 29.9 pg (ref 26.0–34.0)
MCHC: 33.1 g/dL (ref 30.0–36.0)
MCV: 90.5 fL (ref 78.0–100.0)
Platelets: 140 10*3/uL — ABNORMAL LOW (ref 150–400)
RBC: 4.01 MIL/uL (ref 3.87–5.11)
RDW: 14.5 % (ref 11.5–15.5)
WBC: 10.5 10*3/uL (ref 4.0–10.5)

## 2013-06-15 LAB — GLUCOSE, CAPILLARY: Glucose-Capillary: 99 mg/dL (ref 70–99)

## 2013-06-15 LAB — BASIC METABOLIC PANEL
BUN: 15 mg/dL (ref 6–23)
CO2: 21 mEq/L (ref 19–32)
Calcium: 8.5 mg/dL (ref 8.4–10.5)
Chloride: 108 mEq/L (ref 96–112)
Creatinine, Ser: 0.71 mg/dL (ref 0.50–1.10)
GFR calc Af Amer: >90 mL/min (ref >90)
GFR calc non Af Amer: >90 mL/min (ref >90)
Glucose, Bld: 112 mg/dL — ABNORMAL HIGH (ref 70–99)
Potassium: 3.5 mEq/L — ABNORMAL LOW (ref 3.7–5.3)
Sodium: 142 mEq/L (ref 137–147)

## 2013-06-15 LAB — LIPID PANEL
Cholesterol: 206 mg/dL — ABNORMAL HIGH (ref 0–200)
HDL: 61 mg/dL (ref >39)
LDL Cholesterol: 122 mg/dL — ABNORMAL HIGH (ref 0–99)
Total CHOL/HDL Ratio: 3.4 RATIO
Triglycerides: 114 mg/dL (ref <150)
VLDL: 23 mg/dL (ref 0–40)

## 2013-06-15 LAB — HEMOGLOBIN A1C
Hgb A1c MFr Bld: 6.1 % — ABNORMAL HIGH (ref <5.7)
Mean Plasma Glucose: 128 mg/dL — ABNORMAL HIGH (ref <117)

## 2013-06-15 LAB — CK: Total CK: 1295 U/L — ABNORMAL HIGH (ref 7–177)

## 2013-06-15 LAB — PROTIME-INR
INR: 1.29 (ref 0.00–1.49)
Prothrombin Time: 15.8 seconds — ABNORMAL HIGH (ref 11.6–15.2)

## 2013-06-15 MED ORDER — WARFARIN SODIUM 7.5 MG PO TABS
7.5000 mg | ORAL_TABLET | Freq: Once | ORAL | Status: AC
  Administered 2013-06-16: 7.5 mg via ORAL
  Filled 2013-06-15: qty 1

## 2013-06-15 NOTE — Progress Notes (Signed)
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 Oxycodone-Acetaminophen Shortness Of Breath and Nausea Only  . Propoxyphene N-Acetaminophen Shortness Of Breath and Nausea Only  . Sulfonamide Derivatives Shortness Of Breath and Nausea Only  . Codeine     REACTION: nausea  . Latex     Patient Measurements: Height: 5' 4.17" (163 cm) Weight: 172 lb 9.9 oz (78.3 kg) IBW/kg (Calculated) : 55.1 Heparin Dosing Weight:   Vital Signs: Temp: 98.9 F (37.2 C) (01/09 0943) Temp src: Oral (01/09 0943) BP: 106/58 mmHg (01/09 0943) Pulse Rate: 102 (01/09 0943)  Labs:  Recent Labs  06/14/13 1303 06/14/13 1602 06/15/13 0513  HGB 13.6  --  12.0  HCT 40.5  --  36.3  PLT 157  --  140*  APTT  --  25  --   LABPROT  --  14.3 15.8*  INR  --  1.13 1.29  CREATININE 0.55  --  0.71  CKTOTAL 2232*  --  1295*    Estimated Creatinine Clearance: 83.6 ml/min (by C-G formula based on Cr of 0.71).   Assessment: 29 YOF with history of recurrent CVAs, recurrent DVTs and PEs, DM, and APS admitted for new CVA work-up after daughter found pt with confusion. No acute intracranial finding per head CT. INR only 1.13.  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 INR 1.29 today.  Infectious Disease: WBC 10.5 down. No abx. Tmax 99.5  Cardiovascular: HTN / HLD / CHF. 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   PTA Medication Issues: Nexium, Amaryl, Losartan, Nystatin powder,   Best Practices: Coumadin   Goal of Therapy:   INR 2-3 Monitor platelets by anticoagulation protocol: Yes   Plan:  - Coumadin 7.5mg  PO today again - Daily PT / INR - F/U change ASA to 81mg  once INR therapeutic  Tyrrell Stephens S. Alford Highland, PharmD, BCPS Clinical Staff Pharmacist Pager [phone]  Eilene Ghazi Stillinger 06/15/2013,9:57 AM

## 2013-06-15 NOTE — Progress Notes (Signed)
*  PRELIMINARY RESULTS* Vascular Ultrasound Carotid Duplex (Doppler) has been completed.  Preliminary findings: Bilateral:  1-39% ICA stenosis.  Vertebral artery flow is antegrade.      Landry Mellow, RDMS, RVT  06/15/2013, 10:42 AM

## 2013-06-15 NOTE — Progress Notes (Signed)
OT Cancellation Note  Patient Details Name: Connie Ruiz MRN: [id number] DOB: 03/29/1961   Cancelled Treatment:     Reason Evaluation/treatment not completed: Pt getting EKG then headed to vascular per RN staff. Will check back as able for acute OT assessment.  [name] 06/15/2013, 8:55 AM

## 2013-06-15 NOTE — Progress Notes (Signed)
NEURO HOSPITALIST PROGRESS NOTE   SUBJECTIVE:                                                                                                                         Patient is currently speaking better, shows some difficulty with word finding (baseline per son) but able to answer questions and follow commands.  She is significant pain secondary to bruises.  OBJECTIVE:                                                                                                                           Vital signs in last 24 hours: Temp:  [98 F (36.7 C)-99.5 F (37.5 C)] 98.7 F (37.1 C) (01/09 1456) Pulse Rate:  [96-121] 96 (01/09 1456) Resp:  [20-22] 20 (01/09 1456) BP: (103-131)/(58-79) 106/66 mmHg (01/09 1456) SpO2:  [92 %-96 %] 93 % (01/09 1456)  Intake/Output from previous day: 01/08 0701 - 01/09 0700 In: 1000 [I.V.:1000] Out: -  Intake/Output this shift:   Nutritional status: NPO  Past Medical History  Diagnosis Date  . THYROID NODULE, LEFT 04/10/2009  . DIABETES MELLITUS, TYPE II 08/21/2007  . HYPERLIPIDEMIA 08/21/2007  . GOUT 08/21/2007  . HYPERTENSION 08/21/2007    Dr. Andree Elk, [place] 08/21/2007  . CVA 04/17/2010  . CEREBROVASCULAR ACCIDENT, ACUTE 04/15/2010  . GERD 08/21/2007  . RENAL INSUFFICIENCY 08/21/2007  . LUPUS 08/21/2007  . OSTEOPOROSIS 08/21/2007    Rheumatol at [facility]  . DVT, HX OF 08/21/2007  . CLOSTRIDIUM DIFFICILE COLITIS, HX OF 08/21/2007  . KIDNEY TRANSPLANTATION, HX OF 08/22/2007    s/p renal transplant-Dr. Andree Elk, [facility]  . Pulmonary embolism 07/16/2010  . Renal failure   . Current use of long term anticoagulation     Dr. Andree Elk, [facility]  . Depression     Dr. Andree Elk, [facility]  . History of stroke with residual effects   . Right sided weakness     Neurologic Exam:   Mental Status:  Alert, oriented,. Speech now able to answer questions but has some word finding difficulty --at baseline she has  expressive difficulties but now not able to speak in full sentences. Able to follow 2 step commands without difficulty.  Cranial Nerves:  II:  Visual fields shows decreased on the right , pupils equal, round, reactive to light and accommodation  III,IV, VI: ptosis not present, extra-ocular motions intact bilaterally  V,VII: smile asymmetric on the right, facial light touch sensation decreased on the right  VIII: hearing normal bilaterally  IX,X: gag reflex present  XI: bilateral shoulder shrug--limited due to pain in right shoulder  XII: midline tongue extension without atrophy or fasciculations  Motor:  Right : Upper extremity refuses to move due to pain    Left:  Upper extremity 5/5   Lower extremity 3/5--limited by pain Cannot lift of the bed   Lower extremity 5/5  --she is able lift leg off the bed 8-10 inches but cannot hold her right leg off the bed due to pain, majority of muscular exam is limited due to patients pain and refusing to move extremities.  Tone and bulk:normal tone throughout; no atrophy noted  Sensory: Pinprick and light touch intact throughout, bilaterally  Deep Tendon Reflexes:  2+ throughout with no AJ  Plantars:  Right: downgoing   Left: downgoing  Cerebellar:  normal finger-to-nose on the left--unable to assess on the right due to pain and weakness, Unable to assess heel-to-shin test  Gait: unable to assess.  CV: pulses palpable throughout   Lab Results: Lab Results  Component Value Date/Time   CHOL 206* 06/15/2013  5:13 AM   Lipid Panel  Recent Labs  06/15/13 0513  CHOL 206*  TRIG 114  HDL 61  CHOLHDL 3.4  VLDL 23  LDLCALC 122*   A1c 6.1   Studies/Results: Dg Thoracic Spine 2 View  06/14/2013   CLINICAL DATA:  Fall and back pain.  EXAM: THORACIC SPINE - 2 VIEW  COMPARISON:  Chest radiograph 10/27/2012  FINDINGS: The bone detail is limited on the lateral view and swimmer's view. The alignment of the thoracic spine and cervical-thoracic spine appear  to be within normal limits. The vertebral body heights are grossly intact. Limited evaluation of the thoracolumbar junction on the lateral view.  IMPRESSION: The bone detail is limited on this examination but the alignment is grossly normal.   Electronically Signed   By: Markus Daft M.D.   On: 06/14/2013 14:23   Dg Shoulder Right  06/14/2013   CLINICAL DATA:  Fall and right shoulder pain.  EXAM: RIGHT SHOULDER - 2+ VIEW  COMPARISON:  None.  FINDINGS: Two views of the right shoulder were obtained. The scapular Y-view is limited but there no gross dislocation. No evidence for an acute fracture. AC joint is grossly intact.  IMPRESSION: Limited examination without gross abnormality.   Electronically Signed   By: Markus Daft M.D.   On: 06/14/2013 14:25   Dg Hip Complete Right  06/14/2013   CLINICAL DATA:  Fall and right hip pain.  EXAM: RIGHT HIP - COMPLETE 2+ VIEW  COMPARISON:  CT 08/25/2011  FINDINGS: Again noted are multiple soft tissue calcifications in the lower abdomen. The pelvic bony ring is intact. Right hip is located without acute fracture.  IMPRESSION: No acute bone abnormality to the pelvis or right hip.   Electronically Signed   By: Markus Daft M.D.   On: 06/14/2013 14:26   Ct Head Wo Contrast  06/14/2013   CLINICAL DATA:  Fall in bathtub last night, found still in tub this morning. Trauma and neck pain.  EXAM: CT HEAD WITHOUT CONTRAST  CT MAXILLOFACIAL WITHOUT CONTRAST  CT CERVICAL SPINE WITHOUT CONTRAST  TECHNIQUE: Multidetector CT imaging of the head, cervical spine,  and maxillofacial structures were performed using the standard protocol without intravenous contrast. Multiplanar CT image reconstructions of the cervical spine and maxillofacial structures were also generated.  COMPARISON:  03/23/2012  FINDINGS: CT HEAD FINDINGS  Small remote lacunar infarcts in both cerebellar hemispheres. These are similar to prior. Brainstem, thalami, right basal ganglia, and basilar cisterns unremarkable.  Remote  infarct of the left basal ganglia with punctate dystrophic internal calcification along the infarct margin. Remote left frontoparietal infarct, similar appearance to prior, with considerable thinning of the left frontal periventricular white matter.  Atherosclerotic calcification of the carotid siphons. No intracranial hemorrhage, mass lesion, or acute CVA.  CT MAXILLOFACIAL FINDINGS  There is degenerative spurring of both mandibular condyles. No facial fracture observed. No acute intra orbital findings. Dental cavity of tooth 29. Scattered tooth decay.  CT CERVICAL SPINE FINDINGS  Suspected small bone islands in the spinous process of C5 and potentially along the posterior superior endplate of T1. This could reflect incidental osteopoikilosis.  No cervical spine fracture or subluxation is observed. No vertebral subluxation is observed. No prevertebral soft tissue swelling. Bone island noted in the right 2nd rib posteriorly on the axial images.  Prominent thyroid gland with indistinct nodularity noted. The patient has had prior thyroid ultrasound workups.  There is low-grade stranding in the right supraclavicular region, of uncertain significance and etiology, as shown on axial images 59-74 of series 10.  IMPRESSION: 1. No acute intracranial findings. Remote cerebellar and left basal ganglia infarcts is. Remote left frontoparietal infarct. 2. Low grade stranding/edema in the right supraclavicular region, without cervical spine abnormality or other specific cause for this low grade stranding observed. 3. Scattered tooht decay, particularly of tooth 20. 9. No facial fracture. No cervical spine fracture or cervical spine subluxation.   Electronically Signed   By: Sherryl Barters M.D.   On: 06/14/2013 15:27   Ct Cervical Spine Wo Contrast  06/14/2013   CLINICAL DATA:  Fall in bathtub last night, found still in tub this morning. Trauma and neck pain.  EXAM: CT HEAD WITHOUT CONTRAST  CT MAXILLOFACIAL WITHOUT CONTRAST   CT CERVICAL SPINE WITHOUT CONTRAST  TECHNIQUE: Multidetector CT imaging of the head, cervical spine, and maxillofacial structures were performed using the standard protocol without intravenous contrast. Multiplanar CT image reconstructions of the cervical spine and maxillofacial structures were also generated.  COMPARISON:  03/23/2012  FINDINGS: CT HEAD FINDINGS  Small remote lacunar infarcts in both cerebellar hemispheres. These are similar to prior. Brainstem, thalami, right basal ganglia, and basilar cisterns unremarkable.  Remote infarct of the left basal ganglia with punctate dystrophic internal calcification along the infarct margin. Remote left frontoparietal infarct, similar appearance to prior, with considerable thinning of the left frontal periventricular white matter.  Atherosclerotic calcification of the carotid siphons. No intracranial hemorrhage, mass lesion, or acute CVA.  CT MAXILLOFACIAL FINDINGS  There is degenerative spurring of both mandibular condyles. No facial fracture observed. No acute intra orbital findings. Dental cavity of tooth 29. Scattered tooth decay.  CT CERVICAL SPINE FINDINGS  Suspected small bone islands in the spinous process of C5 and potentially along the posterior superior endplate of T1. This could reflect incidental osteopoikilosis.  No cervical spine fracture or subluxation is observed. No vertebral subluxation is observed. No prevertebral soft tissue swelling. Bone island noted in the right 2nd rib posteriorly on the axial images.  Prominent thyroid gland with indistinct nodularity noted. The patient has had prior thyroid ultrasound workups.  There is low-grade stranding in the right  supraclavicular region, of uncertain significance and etiology, as shown on axial images 59-74 of series 10.  IMPRESSION: 1. No acute intracranial findings. Remote cerebellar and left basal ganglia infarcts is. Remote left frontoparietal infarct. 2. Low grade stranding/edema in the right  supraclavicular region, without cervical spine abnormality or other specific cause for this low grade stranding observed. 3. Scattered tooht decay, particularly of tooth 20. 9. No facial fracture. No cervical spine fracture or cervical spine subluxation.   Electronically Signed   By: Sherryl Barters M.D.   On: 06/14/2013 15:27   Mr Brain Wo Contrast  06/14/2013   CLINICAL DATA:  History of prior stroke, antiphospholipid syndrome on Coumadin, renal transplant on immunosuppressants, and recurrent DVT, PE, and diabetes. Worsened left-sided facial droop and worsened right arm and leg weakness with dysarthria.  EXAM: MRI HEAD WITHOUT CONTRAST  MRA HEAD WITHOUT CONTRAST  TECHNIQUE: Multiplanar, multiecho pulse sequences of the brain and surrounding structures were obtained without intravenous contrast. Angiographic images of the head were obtained using MRA technique without contrast.  COMPARISON:  Head CT 06/14/2013 and brain MRI 03/23/2012. Head MRA 03/15/2010.  FINDINGS: MRI HEAD FINDINGS  There is no evidence of acute infarct. Encephalomalacia is again seen related to remote left MCA infarct involving the left frontal and parietal lobes, opercular region, and basal ganglia with associated ex vacuo dilatation of the left lateral ventricle and susceptibility artifact compatible with associated prior hemorrhage. Remote bilateral cerebellar infarcts are again seen as well. There is no evidence of mass, midline shift, or extra-axial fluid collection. Orbits and visualized paranasal sinuses and mastoid air cells are unremarkable.  MRA HEAD FINDINGS  Visualized distal vertebral arteries are patent. PICA origins are patent. Basilar artery is patent without stenosis. SCAs are patent. PCAs are patent with mild to moderate distal branch vessel irregularity, right greater than left, stable to slightly increased from the prior on the left.  Internal carotid arteries are patent from skullbase carotid terminus. There is mild to  moderate irregularity and narrowing of the supra clinoid ICAs bilaterally, mildly increased from prior. ACA and MCA origins are patent. Visualized right MCA branches are patent and demonstrate mild to moderate irregularity, slightly increased from prior. The left M1 segment hasrecanalized, as has 1 of the M2 branches. The other main M2 trunk remains occluded. There is moderate left MCA branch vessel irregularity. There is moderate irregularity of the right A1 segment, which has increased, particularly distally. Left A2 irregularity is similar to the prior exam. No intracranial aneurysm or new major intracranial arterial occlusion is identified.  IMPRESSION: 1. No evidence of acute infarct or other acute intracranial abnormality. 2. Similar appearance of encephalomalacia related to left MCA infarct as well as bilateral cerebellar infarcts. 3. Interval partial recanalization of the left MCA as above. 4. Overall mild interval progression of atherosclerotic type changes of the anterior and posterior circulation as above.   Electronically Signed   By: Logan Bores   On: 06/14/2013 21:03   Dg Knee Complete 4 Views Right  06/14/2013   CLINICAL DATA:  Right anterior knee pain status post fall with visible soft tissue swelling anteriorly.  EXAM: RIGHT KNEE - COMPLETE 4+ VIEW  COMPARISON:  None.  FINDINGS: The bones of the right knee are osteopenic diffusely. There is abnormal appearance of the proximal fibular metadiaphysis. This may reflect an acute or old fracture. The patella appears intact and normally positioned. A small amount of soft tissue swelling anteriorly is suspected. There are vascular calcifications.  IMPRESSION:  The distal femur, the patella, and the proximal tibia appear intact. There is no abnormal appearance of the meta diaphysis of the adjacent fibula. This may reflect an acute or healing fracture. There is diffuse osteopenia. Correlation with the patient's clinical examination is needed. CT scanning is  available upon request.   Electronically Signed   By: David  [name]   On: 06/14/2013 14:23   Mr Jodene Nam Head/brain Wo Cm  06/14/2013   CLINICAL DATA:  History of prior stroke, antiphospholipid syndrome on Coumadin, renal transplant on immunosuppressants, and recurrent DVT, PE, and diabetes. Worsened left-sided facial droop and worsened right arm and leg weakness with dysarthria.  EXAM: MRI HEAD WITHOUT CONTRAST  MRA HEAD WITHOUT CONTRAST  TECHNIQUE: Multiplanar, multiecho pulse sequences of the brain and surrounding structures were obtained without intravenous contrast. Angiographic images of the head were obtained using MRA technique without contrast.  COMPARISON:  Head CT 06/14/2013 and brain MRI 03/23/2012. Head MRA 03/15/2010.  FINDINGS: MRI HEAD FINDINGS  There is no evidence of acute infarct. Encephalomalacia is again seen related to remote left MCA infarct involving the left frontal and parietal lobes, opercular region, and basal ganglia with associated ex vacuo dilatation of the left lateral ventricle and susceptibility artifact compatible with associated prior hemorrhage. Remote bilateral cerebellar infarcts are again seen as well. There is no evidence of mass, midline shift, or extra-axial fluid collection. Orbits and visualized paranasal sinuses and mastoid air cells are unremarkable.  MRA HEAD FINDINGS  Visualized distal vertebral arteries are patent. PICA origins are patent. Basilar artery is patent without stenosis. SCAs are patent. PCAs are patent with mild to moderate distal branch vessel irregularity, right greater than left, stable to slightly increased from the prior on the left.  Internal carotid arteries are patent from skullbase carotid terminus. There is mild to moderate irregularity and narrowing of the supra clinoid ICAs bilaterally, mildly increased from prior. ACA and MCA origins are patent. Visualized right MCA branches are patent and demonstrate mild to moderate irregularity, slightly  increased from prior. The left M1 segment hasrecanalized, as has 1 of the M2 branches. The other main M2 trunk remains occluded. There is moderate left MCA branch vessel irregularity. There is moderate irregularity of the right A1 segment, which has increased, particularly distally. Left A2 irregularity is similar to the prior exam. No intracranial aneurysm or new major intracranial arterial occlusion is identified.  IMPRESSION: 1. No evidence of acute infarct or other acute intracranial abnormality. 2. Similar appearance of encephalomalacia related to left MCA infarct as well as bilateral cerebellar infarcts. 3. Interval partial recanalization of the left MCA as above. 4. Overall mild interval progression of atherosclerotic type changes of the anterior and posterior circulation as above.   Electronically Signed   By: Logan Bores   On: 06/14/2013 21:03   Ct Maxillofacial Wo Cm  06/14/2013   CLINICAL DATA:  Fall in bathtub last night, found still in tub this morning. Trauma and neck pain.  EXAM: CT HEAD WITHOUT CONTRAST  CT MAXILLOFACIAL WITHOUT CONTRAST  CT CERVICAL SPINE WITHOUT CONTRAST  TECHNIQUE: Multidetector CT imaging of the head, cervical spine, and maxillofacial structures were performed using the standard protocol without intravenous contrast. Multiplanar CT image reconstructions of the cervical spine and maxillofacial structures were also generated.  COMPARISON:  03/23/2012  FINDINGS: CT HEAD FINDINGS  Small remote lacunar infarcts in both cerebellar hemispheres. These are similar to prior. Brainstem, thalami, right basal ganglia, and basilar cisterns unremarkable.  Remote infarct of the left  basal ganglia with punctate dystrophic internal calcification along the infarct margin. Remote left frontoparietal infarct, similar appearance to prior, with considerable thinning of the left frontal periventricular white matter.  Atherosclerotic calcification of the carotid siphons. No intracranial hemorrhage,  mass lesion, or acute CVA.  CT MAXILLOFACIAL FINDINGS  There is degenerative spurring of both mandibular condyles. No facial fracture observed. No acute intra orbital findings. Dental cavity of tooth 29. Scattered tooth decay.  CT CERVICAL SPINE FINDINGS  Suspected small bone islands in the spinous process of C5 and potentially along the posterior superior endplate of T1. This could reflect incidental osteopoikilosis.  No cervical spine fracture or subluxation is observed. No vertebral subluxation is observed. No prevertebral soft tissue swelling. Bone island noted in the right 2nd rib posteriorly on the axial images.  Prominent thyroid gland with indistinct nodularity noted. The patient has had prior thyroid ultrasound workups.  There is low-grade stranding in the right supraclavicular region, of uncertain significance and etiology, as shown on axial images 59-74 of series 10.  IMPRESSION: 1. No acute intracranial findings. Remote cerebellar and left basal ganglia infarcts is. Remote left frontoparietal infarct. 2. Low grade stranding/edema in the right supraclavicular region, without cervical spine abnormality or other specific cause for this low grade stranding observed. 3. Scattered tooht decay, particularly of tooth 20. 9. No facial fracture. No cervical spine fracture or cervical spine subluxation.   Electronically Signed   By: Sherryl Barters M.D.   On: 06/14/2013 15:27   EEG:  Interpretation: This EEG is abnormal with moderate generalized nonspecific continuous slowing of cerebral activity, consistent with mild to moderate encephalopathic state. No evidence of an epileptic disorder was demonstrated.  Vascular Ultrasound  Carotid Duplex (Doppler) has been completed. Preliminary findings: Bilateral: 1-39% ICA stenosis. Vertebral artery flow is antegrade.   MEDICATIONS                                                                                                                        Scheduled: .  aspirin  300 mg Rectal Daily   Or  . aspirin  325 mg Oral Daily  . calcitRIOL  0.25 mcg Oral Daily  . darifenacin  15 mg Oral Daily  . diltiazem  180 mg Oral Daily  . folic acid  1 mg Oral Daily  . mycophenolate  250 mg Oral BID  . predniSONE  1 mg Oral Q breakfast  . sertraline  100 mg Oral Daily  . simvastatin  10 mg Oral Daily  . tacrolimus  0.5 mg Oral BID  . warfarin  7.5 mg Oral ONCE-1800  . Warfarin - Pharmacist Dosing Inpatient   Does not apply q1800    ASSESSMENT/PLAN:  TIA:  Patient is now back to her baseline with only word finding difficulty (baseline).  Likely TIA in setting of sub therapeutic INR. Carotid doppler and 2 D echo were normal.  EEG showed no epileptiform activity.  A1c is 6.1 and LDL is 122. NO further neurologic diagnostic testing per neurology.    Recommend:  1) Out patient neurology follow up    Assessment and plan discussed with with attending physician and they are in agreement.    Etta Quill PA-C [facility] [phone]  06/15/2013, 5:18 PM

## 2013-06-15 NOTE — Progress Notes (Signed)
  Echocardiogram 2D Echocardiogram has been performed.  Mauricio Po 06/15/2013, 11:18 AM

## 2013-06-15 NOTE — Progress Notes (Signed)
EEG Completed; Results Pending  

## 2013-06-15 NOTE — ED Provider Notes (Signed)
Medical screening examination/treatment/procedure(s) were conducted as a shared visit with non-physician practitioner(s) and myself.  I personally evaluated the patient during the encounter. Pt presents after she was unable to get out of bathtub last night due to inc R sided weakness.  On PE, Pt in NAD, signs of prolonged immobilization with stage II pressure ulcers of R side of body.  Pt found to have no new CT head changes, elevated CK c/w rhabdomyolysis. Pt admitted to [facility] to r/o new CVA, tx rhabdo.   EKG Interpretation    Date/Time:    Ventricular Rate:    PR Interval:    QRS Duration:   QT Interval:    QTC Calculation:   R Axis:     Text Interpretation:                Neta Ehlers, MD 06/15/13 1005

## 2013-06-15 NOTE — Evaluation (Signed)
SLP Cancellation Note  Patient Details Name: Connie Ruiz MRN: [id number] DOB: September 28, 1960   Cancelled treatment:       Reason Eval/Treat Not Completed: Patient at procedure or test/unavailable (spoke to daughter Cheree Ditto who reports pt speech has been impaired since previous stroke 3 years ago but is worse now than normal.  Will  return for eval as schedule allows. )   Luanna Salk, [facility] SLP [phone]

## 2013-06-15 NOTE — Progress Notes (Signed)
OT Cancellation Note  Patient Details Name: Connie Ruiz MRN: [id number] DOB: 06/02/1961   Cancelled Treatment:      Reason Eval/Treat Not Completed: Patient continues to be at procedure or test & is unavailable. Will return for eval as schedule allows.   [name] 06/15/2013, 11:24 AM

## 2013-06-15 NOTE — Progress Notes (Signed)
[facility] PROGRESS NOTE  KAYMANI FAUTH [id number] DOB: Jan 23, 1961 DOA: 06/14/2013 PCP: Renato Shin, MD Brief HPI:  Patient is a 53 year old female with history of prior stroke with right-sided residual weakness, history of antiphospholipid syndrome on Coumadin, renal transplant on immunosuppressants, recurrent DVT, PE, diabetes was brought to the ER by her family after found in the bathtub today, where she was lying whole night. Family members noticed that she does have left-sided facial droop at baseline which is worse, they also feel her right arm and leg weakness is also worsened along with the speech/dysarthria. CT head is negative for any acute stroke. She was admitted for CVA work up.    Assessment/Plan: Acute encephalopathy with a worsened right-sided weakness, history of prior multiple strokes  MRI does not show any acute infarct . Encephalomalacia related to left MCA infarct.  subtherapeutic INR, aspirin added.  Neurology has been consulted we'll follow recommendations.  EEG does not show epileptiform activity.  Echocardiogram  Reveals the estimated ejection fraction was in the range of 60% to 65%. Wall motion was normal; there were no regional wall motion abnormalities. Doppler parameters are consistent with abnormal left ventricular relaxation (grade 1 diastolic dysfunction).  HYPERLIPIDEMIA  - , continue Zocor  HYPERTENSION  - Continue Cardizem  Type II or unspecified type diabetes mellitus with renal manifestations, uncontrolled(250.42)  - Continue sliding scale insulin, obtain hemoglobin A1c  Rhabdomyolysis  - Placed on IV fluid hydration  Hypokalemia: Placed on potassium replacement  History of renal transplant  - Continue prednisone, Prograf, CellCept   DVT prophylaxis.      Code Status: full code Family Communication: discussed the plan of care with the patient's daughter at bedside Disposition Plan: possibly in am.     Consultants:  neurology  Procedures:  MRI brain   MRA HEAD and neck  Echocardiogram   Carotid duplex  Antibiotics:  none  HPI/Subjective: Reports pain in the right shoulder.   Objective: Filed Vitals:   06/15/13 1820  BP: 135/73  Pulse: 95  Temp: 98.6 F (37 C)  Resp: 20   No intake or output data in the 24 hours ending 06/15/13 1828 Filed Weights   06/14/13 1700  Weight: 78.3 kg (172 lb 9.9 oz)    Exam:   General:  arousable afebrile comfortable  Cardiovascular: s1s2  Respiratory: ctab  Abdomen: soft NT ND BS+  Musculoskeletal: TRACE PEDAL EDEMA  Data Reviewed: Basic Metabolic Panel:  Recent Labs Lab 06/14/13 1303 06/15/13 0513  NA 141 142  K 3.1* 3.5*  CL 102 108  CO2 23 21  GLUCOSE 117* 112*  BUN 13 15  CREATININE 0.55 0.71  CALCIUM 9.5 8.5   Liver Function Tests:  Recent Labs Lab 06/14/13 1303  AST 67*  ALT 25  ALKPHOS 74  BILITOT 1.0  PROT 7.4  ALBUMIN 3.3*    Recent Labs Lab 06/14/13 1303  LIPASE 17   No results found for this basename: AMMONIA,  in the last 168 hours CBC:  Recent Labs Lab 06/14/13 1303 06/15/13 0513  WBC 12.4* 10.5  NEUTROABS 9.9*  --   HGB 13.6 12.0  HCT 40.5 36.3  MCV 87.9 90.5  PLT 157 140*   Cardiac Enzymes:  Recent Labs Lab 06/14/13 1303 06/15/13 0513  CKTOTAL 2232* 1295*   BNP (last 3 results) No results found for this basename: PROBNP,  in the last 8760 hours CBG:  Recent Labs Lab 06/14/13 2252  GLUCAP 125*    No results found for  this or any previous visit (from the past 240 hour(s)).   Studies: Dg Thoracic Spine 2 View  06/14/2013   CLINICAL DATA:  Fall and back pain.  EXAM: THORACIC SPINE - 2 VIEW  COMPARISON:  Chest radiograph 10/27/2012  FINDINGS: The bone detail is limited on the lateral view and swimmer's view. The alignment of the thoracic spine and cervical-thoracic spine appear to be within normal limits. The vertebral body heights are grossly intact.  Limited evaluation of the thoracolumbar junction on the lateral view.  IMPRESSION: The bone detail is limited on this examination but the alignment is grossly normal.   Electronically Signed   By: Markus Daft M.D.   On: 06/14/2013 14:23   Dg Shoulder Right  06/14/2013   CLINICAL DATA:  Fall and right shoulder pain.  EXAM: RIGHT SHOULDER - 2+ VIEW  COMPARISON:  None.  FINDINGS: Two views of the right shoulder were obtained. The scapular Y-view is limited but there no gross dislocation. No evidence for an acute fracture. AC joint is grossly intact.  IMPRESSION: Limited examination without gross abnormality.   Electronically Signed   By: Markus Daft M.D.   On: 06/14/2013 14:25   Dg Hip Complete Right  06/14/2013   CLINICAL DATA:  Fall and right hip pain.  EXAM: RIGHT HIP - COMPLETE 2+ VIEW  COMPARISON:  CT 08/25/2011  FINDINGS: Again noted are multiple soft tissue calcifications in the lower abdomen. The pelvic bony ring is intact. Right hip is located without acute fracture.  IMPRESSION: No acute bone abnormality to the pelvis or right hip.   Electronically Signed   By: Markus Daft M.D.   On: 06/14/2013 14:26   Ct Head Wo Contrast  06/14/2013   CLINICAL DATA:  Fall in bathtub last night, found still in tub this morning. Trauma and neck pain.  EXAM: CT HEAD WITHOUT CONTRAST  CT MAXILLOFACIAL WITHOUT CONTRAST  CT CERVICAL SPINE WITHOUT CONTRAST  TECHNIQUE: Multidetector CT imaging of the head, cervical spine, and maxillofacial structures were performed using the standard protocol without intravenous contrast. Multiplanar CT image reconstructions of the cervical spine and maxillofacial structures were also generated.  COMPARISON:  03/23/2012  FINDINGS: CT HEAD FINDINGS  Small remote lacunar infarcts in both cerebellar hemispheres. These are similar to prior. Brainstem, thalami, right basal ganglia, and basilar cisterns unremarkable.  Remote infarct of the left basal ganglia with punctate dystrophic internal  calcification along the infarct margin. Remote left frontoparietal infarct, similar appearance to prior, with considerable thinning of the left frontal periventricular white matter.  Atherosclerotic calcification of the carotid siphons. No intracranial hemorrhage, mass lesion, or acute CVA.  CT MAXILLOFACIAL FINDINGS  There is degenerative spurring of both mandibular condyles. No facial fracture observed. No acute intra orbital findings. Dental cavity of tooth 29. Scattered tooth decay.  CT CERVICAL SPINE FINDINGS  Suspected small bone islands in the spinous process of C5 and potentially along the posterior superior endplate of T1. This could reflect incidental osteopoikilosis.  No cervical spine fracture or subluxation is observed. No vertebral subluxation is observed. No prevertebral soft tissue swelling. Bone island noted in the right 2nd rib posteriorly on the axial images.  Prominent thyroid gland with indistinct nodularity noted. The patient has had prior thyroid ultrasound workups.  There is low-grade stranding in the right supraclavicular region, of uncertain significance and etiology, as shown on axial images 59-74 of series 10.  IMPRESSION: 1. No acute intracranial findings. Remote cerebellar and left basal ganglia infarcts is. Remote  left frontoparietal infarct. 2. Low grade stranding/edema in the right supraclavicular region, without cervical spine abnormality or other specific cause for this low grade stranding observed. 3. Scattered tooht decay, particularly of tooth 20. 9. No facial fracture. No cervical spine fracture or cervical spine subluxation.   Electronically Signed   By: Sherryl Barters M.D.   On: 06/14/2013 15:27   Ct Cervical Spine Wo Contrast  06/14/2013   CLINICAL DATA:  Fall in bathtub last night, found still in tub this morning. Trauma and neck pain.  EXAM: CT HEAD WITHOUT CONTRAST  CT MAXILLOFACIAL WITHOUT CONTRAST  CT CERVICAL SPINE WITHOUT CONTRAST  TECHNIQUE: Multidetector CT  imaging of the head, cervical spine, and maxillofacial structures were performed using the standard protocol without intravenous contrast. Multiplanar CT image reconstructions of the cervical spine and maxillofacial structures were also generated.  COMPARISON:  03/23/2012  FINDINGS: CT HEAD FINDINGS  Small remote lacunar infarcts in both cerebellar hemispheres. These are similar to prior. Brainstem, thalami, right basal ganglia, and basilar cisterns unremarkable.  Remote infarct of the left basal ganglia with punctate dystrophic internal calcification along the infarct margin. Remote left frontoparietal infarct, similar appearance to prior, with considerable thinning of the left frontal periventricular white matter.  Atherosclerotic calcification of the carotid siphons. No intracranial hemorrhage, mass lesion, or acute CVA.  CT MAXILLOFACIAL FINDINGS  There is degenerative spurring of both mandibular condyles. No facial fracture observed. No acute intra orbital findings. Dental cavity of tooth 29. Scattered tooth decay.  CT CERVICAL SPINE FINDINGS  Suspected small bone islands in the spinous process of C5 and potentially along the posterior superior endplate of T1. This could reflect incidental osteopoikilosis.  No cervical spine fracture or subluxation is observed. No vertebral subluxation is observed. No prevertebral soft tissue swelling. Bone island noted in the right 2nd rib posteriorly on the axial images.  Prominent thyroid gland with indistinct nodularity noted. The patient has had prior thyroid ultrasound workups.  There is low-grade stranding in the right supraclavicular region, of uncertain significance and etiology, as shown on axial images 59-74 of series 10.  IMPRESSION: 1. No acute intracranial findings. Remote cerebellar and left basal ganglia infarcts is. Remote left frontoparietal infarct. 2. Low grade stranding/edema in the right supraclavicular region, without cervical spine abnormality or other  specific cause for this low grade stranding observed. 3. Scattered tooht decay, particularly of tooth 20. 9. No facial fracture. No cervical spine fracture or cervical spine subluxation.   Electronically Signed   By: Sherryl Barters M.D.   On: 06/14/2013 15:27   Mr Brain Wo Contrast  06/14/2013   CLINICAL DATA:  History of prior stroke, antiphospholipid syndrome on Coumadin, renal transplant on immunosuppressants, and recurrent DVT, PE, and diabetes. Worsened left-sided facial droop and worsened right arm and leg weakness with dysarthria.  EXAM: MRI HEAD WITHOUT CONTRAST  MRA HEAD WITHOUT CONTRAST  TECHNIQUE: Multiplanar, multiecho pulse sequences of the brain and surrounding structures were obtained without intravenous contrast. Angiographic images of the head were obtained using MRA technique without contrast.  COMPARISON:  Head CT 06/14/2013 and brain MRI 03/23/2012. Head MRA 03/15/2010.  FINDINGS: MRI HEAD FINDINGS  There is no evidence of acute infarct. Encephalomalacia is again seen related to remote left MCA infarct involving the left frontal and parietal lobes, opercular region, and basal ganglia with associated ex vacuo dilatation of the left lateral ventricle and susceptibility artifact compatible with associated prior hemorrhage. Remote bilateral cerebellar infarcts are again seen as well. There is no  evidence of mass, midline shift, or extra-axial fluid collection. Orbits and visualized paranasal sinuses and mastoid air cells are unremarkable.  MRA HEAD FINDINGS  Visualized distal vertebral arteries are patent. PICA origins are patent. Basilar artery is patent without stenosis. SCAs are patent. PCAs are patent with mild to moderate distal branch vessel irregularity, right greater than left, stable to slightly increased from the prior on the left.  Internal carotid arteries are patent from skullbase carotid terminus. There is mild to moderate irregularity and narrowing of the supra clinoid ICAs  bilaterally, mildly increased from prior. ACA and MCA origins are patent. Visualized right MCA branches are patent and demonstrate mild to moderate irregularity, slightly increased from prior. The left M1 segment hasrecanalized, as has 1 of the M2 branches. The other main M2 trunk remains occluded. There is moderate left MCA branch vessel irregularity. There is moderate irregularity of the right A1 segment, which has increased, particularly distally. Left A2 irregularity is similar to the prior exam. No intracranial aneurysm or new major intracranial arterial occlusion is identified.  IMPRESSION: 1. No evidence of acute infarct or other acute intracranial abnormality. 2. Similar appearance of encephalomalacia related to left MCA infarct as well as bilateral cerebellar infarcts. 3. Interval partial recanalization of the left MCA as above. 4. Overall mild interval progression of atherosclerotic type changes of the anterior and posterior circulation as above.   Electronically Signed   By: Logan Bores   On: 06/14/2013 21:03   Dg Knee Complete 4 Views Right  06/14/2013   CLINICAL DATA:  Right anterior knee pain status post fall with visible soft tissue swelling anteriorly.  EXAM: RIGHT KNEE - COMPLETE 4+ VIEW  COMPARISON:  None.  FINDINGS: The bones of the right knee are osteopenic diffusely. There is abnormal appearance of the proximal fibular metadiaphysis. This may reflect an acute or old fracture. The patella appears intact and normally positioned. A small amount of soft tissue swelling anteriorly is suspected. There are vascular calcifications.  IMPRESSION: The distal femur, the patella, and the proximal tibia appear intact. There is no abnormal appearance of the meta diaphysis of the adjacent fibula. This may reflect an acute or healing fracture. There is diffuse osteopenia. Correlation with the patient's clinical examination is needed. CT scanning is available upon request.   Electronically Signed   By: David   [name]   On: 06/14/2013 14:23   Mr Jodene Nam Head/brain Wo Cm  06/14/2013   CLINICAL DATA:  History of prior stroke, antiphospholipid syndrome on Coumadin, renal transplant on immunosuppressants, and recurrent DVT, PE, and diabetes. Worsened left-sided facial droop and worsened right arm and leg weakness with dysarthria.  EXAM: MRI HEAD WITHOUT CONTRAST  MRA HEAD WITHOUT CONTRAST  TECHNIQUE: Multiplanar, multiecho pulse sequences of the brain and surrounding structures were obtained without intravenous contrast. Angiographic images of the head were obtained using MRA technique without contrast.  COMPARISON:  Head CT 06/14/2013 and brain MRI 03/23/2012. Head MRA 03/15/2010.  FINDINGS: MRI HEAD FINDINGS  There is no evidence of acute infarct. Encephalomalacia is again seen related to remote left MCA infarct involving the left frontal and parietal lobes, opercular region, and basal ganglia with associated ex vacuo dilatation of the left lateral ventricle and susceptibility artifact compatible with associated prior hemorrhage. Remote bilateral cerebellar infarcts are again seen as well. There is no evidence of mass, midline shift, or extra-axial fluid collection. Orbits and visualized paranasal sinuses and mastoid air cells are unremarkable.  MRA HEAD FINDINGS  Visualized distal vertebral  arteries are patent. PICA origins are patent. Basilar artery is patent without stenosis. SCAs are patent. PCAs are patent with mild to moderate distal branch vessel irregularity, right greater than left, stable to slightly increased from the prior on the left.  Internal carotid arteries are patent from skullbase carotid terminus. There is mild to moderate irregularity and narrowing of the supra clinoid ICAs bilaterally, mildly increased from prior. ACA and MCA origins are patent. Visualized right MCA branches are patent and demonstrate mild to moderate irregularity, slightly increased from prior. The left M1 segment hasrecanalized, as has  1 of the M2 branches. The other main M2 trunk remains occluded. There is moderate left MCA branch vessel irregularity. There is moderate irregularity of the right A1 segment, which has increased, particularly distally. Left A2 irregularity is similar to the prior exam. No intracranial aneurysm or new major intracranial arterial occlusion is identified.  IMPRESSION: 1. No evidence of acute infarct or other acute intracranial abnormality. 2. Similar appearance of encephalomalacia related to left MCA infarct as well as bilateral cerebellar infarcts. 3. Interval partial recanalization of the left MCA as above. 4. Overall mild interval progression of atherosclerotic type changes of the anterior and posterior circulation as above.   Electronically Signed   By: Logan Bores   On: 06/14/2013 21:03   Ct Maxillofacial Wo Cm  06/14/2013   CLINICAL DATA:  Fall in bathtub last night, found still in tub this morning. Trauma and neck pain.  EXAM: CT HEAD WITHOUT CONTRAST  CT MAXILLOFACIAL WITHOUT CONTRAST  CT CERVICAL SPINE WITHOUT CONTRAST  TECHNIQUE: Multidetector CT imaging of the head, cervical spine, and maxillofacial structures were performed using the standard protocol without intravenous contrast. Multiplanar CT image reconstructions of the cervical spine and maxillofacial structures were also generated.  COMPARISON:  03/23/2012  FINDINGS: CT HEAD FINDINGS  Small remote lacunar infarcts in both cerebellar hemispheres. These are similar to prior. Brainstem, thalami, right basal ganglia, and basilar cisterns unremarkable.  Remote infarct of the left basal ganglia with punctate dystrophic internal calcification along the infarct margin. Remote left frontoparietal infarct, similar appearance to prior, with considerable thinning of the left frontal periventricular white matter.  Atherosclerotic calcification of the carotid siphons. No intracranial hemorrhage, mass lesion, or acute CVA.  CT MAXILLOFACIAL FINDINGS  There is  degenerative spurring of both mandibular condyles. No facial fracture observed. No acute intra orbital findings. Dental cavity of tooth 29. Scattered tooth decay.  CT CERVICAL SPINE FINDINGS  Suspected small bone islands in the spinous process of C5 and potentially along the posterior superior endplate of T1. This could reflect incidental osteopoikilosis.  No cervical spine fracture or subluxation is observed. No vertebral subluxation is observed. No prevertebral soft tissue swelling. Bone island noted in the right 2nd rib posteriorly on the axial images.  Prominent thyroid gland with indistinct nodularity noted. The patient has had prior thyroid ultrasound workups.  There is low-grade stranding in the right supraclavicular region, of uncertain significance and etiology, as shown on axial images 59-74 of series 10.  IMPRESSION: 1. No acute intracranial findings. Remote cerebellar and left basal ganglia infarcts is. Remote left frontoparietal infarct. 2. Low grade stranding/edema in the right supraclavicular region, without cervical spine abnormality or other specific cause for this low grade stranding observed. 3. Scattered tooht decay, particularly of tooth 20. 9. No facial fracture. No cervical spine fracture or cervical spine subluxation.   Electronically Signed   By: Sherryl Barters M.D.   On: 06/14/2013 15:27  Scheduled Meds: . aspirin  300 mg Rectal Daily   Or  . aspirin  325 mg Oral Daily  . calcitRIOL  0.25 mcg Oral Daily  . darifenacin  15 mg Oral Daily  . diltiazem  180 mg Oral Daily  . folic acid  1 mg Oral Daily  . mycophenolate  250 mg Oral BID  . predniSONE  1 mg Oral Q breakfast  . sertraline  100 mg Oral Daily  . simvastatin  10 mg Oral Daily  . tacrolimus  0.5 mg Oral BID  . warfarin  7.5 mg Oral ONCE-1800  . Warfarin - Pharmacist Dosing Inpatient   Does not apply q1800   Continuous Infusions: . sodium chloride 100 mL/hr at 06/14/13 1833    Principal Problem:   Acute  encephalopathy Active Problems:   HYPERLIPIDEMIA   HYPERTENSION   Type II or unspecified type diabetes mellitus with renal manifestations, uncontrolled(250.42)   History of stroke with residual effects   Right sided weakness   Rhabdomyolysis    Time spent: 30 min    Anwyn Kriegel  [facility] Pager [phone]. If 7PM-7AM, please contact night-coverage at [email or web address], password [facility] 06/15/2013, 6:28 PM  LOS: 1 day

## 2013-06-15 NOTE — Progress Notes (Signed)
UR completed 

## 2013-06-15 NOTE — Progress Notes (Signed)
OT Cancellation Note  Patient Details Name: Connie Ruiz MRN: [id number] DOB: 04-25-1961   Cancelled Treatment:    Reason Eval/Treat Not Completed: Pain limiting ability to participate;Fatigue/lethargy limiting ability to participate (patient had also just worked with PT)  [place], [place] 06/15/2013, 4:06 PM

## 2013-06-15 NOTE — Progress Notes (Signed)
Pt was observed by physical therapist "pocketing" large amounts of food and made NPO. MD made aware. SLP evaluation needed.

## 2013-06-15 NOTE — Procedures (Signed)
ELECTROENCEPHALOGRAM REPORT   Patient: Connie Ruiz       Room #: [id number] EEG No. ID: [id number] Age: 53 y.o.        Sex: female Referring Physician: Karleen Hampshire Report Date:  06/15/2013        Interpreting Physician: Anthony Sar  History: SHARICE RICKELS is an 53 y.o. female with a history of antiphospholipid antibody syndrome, renal transplant on immune suppressants, previous strokes, DVT with pulmonary embolus and diabetes mellitus who was found in her bathtub, having been there apparently overnight and unable to get to her feet. MRI showed no signs of recurrent acute stroke.  Indications for study:  Assess severity of encephalopathy; rule out new-onset seizure disorder.  Technique: This is an 18 channel routine scalp EEG performed at the bedside with bipolar and monopolar montages arranged in accordance to the international 10/20 system of electrode placement.   Description: This EEG recording was performed during wakefulness. Background activity consisted of low amplitude 1-2 Hz diffuse delta activity with superimposed 5-7 Hz theta activity which was highest amplitude in the frontal regions. Occasional runs of 8 Hz alpha rhythm was recorded from posterior head regions. Photic stimulation was not performed. Hyperventilation was not performed. No epileptiform discharges were recorded.  Interpretation: This EEG is abnormal with moderate generalized nonspecific continuous slowing of cerebral activity, consistent with mild to moderate encephalopathic state. No evidence of an epileptic disorder was demonstrated.   Rush Farmer M.D. [facility] [phone]

## 2013-06-15 NOTE — Progress Notes (Signed)
PT Cancellation Note  Patient Details Name: Connie Ruiz MRN: [id number] DOB: May 11, 1961   Cancelled Treatment:    Reason Eval/Treat Not Completed: Patient at procedure or test/unavailable   Duncan Dull 06/15/2013, 9:52 AM

## 2013-06-16 DIAGNOSIS — I10 Essential (primary) hypertension: Secondary | ICD-10-CM

## 2013-06-16 LAB — URINALYSIS, ROUTINE W REFLEX MICROSCOPIC
Glucose, UA: NEGATIVE mg/dL
Hgb urine dipstick: NEGATIVE
Ketones, ur: NEGATIVE mg/dL
Nitrite: NEGATIVE
Protein, ur: NEGATIVE mg/dL
Specific Gravity, Urine: 1.031 — ABNORMAL HIGH (ref 1.005–1.030)
Urobilinogen, UA: 0.2 mg/dL (ref 0.0–1.0)
pH: 6 (ref 5.0–8.0)

## 2013-06-16 LAB — BASIC METABOLIC PANEL
BUN: 13 mg/dL (ref 6–23)
CO2: 22 mEq/L (ref 19–32)
Calcium: 8.7 mg/dL (ref 8.4–10.5)
Chloride: 111 mEq/L (ref 96–112)
Creatinine, Ser: 0.58 mg/dL (ref 0.50–1.10)
GFR calc Af Amer: >90 mL/min (ref >90)
GFR calc non Af Amer: >90 mL/min (ref >90)
Glucose, Bld: 87 mg/dL (ref 70–99)
Potassium: 3.7 mEq/L (ref 3.7–5.3)
Sodium: 143 mEq/L (ref 137–147)

## 2013-06-16 LAB — GLUCOSE, CAPILLARY
Glucose-Capillary: 82 mg/dL (ref 70–99)
Glucose-Capillary: 90 mg/dL (ref 70–99)
Glucose-Capillary: 110 mg/dL — ABNORMAL HIGH (ref 70–99)
Glucose-Capillary: 126 mg/dL — ABNORMAL HIGH (ref 70–99)

## 2013-06-16 LAB — URINE MICROSCOPIC-ADD ON

## 2013-06-16 LAB — PROTIME-INR
INR: 1.44 (ref 0.00–1.49)
Prothrombin Time: 17.2 seconds — ABNORMAL HIGH (ref 11.6–15.2)

## 2013-06-16 LAB — CK: Total CK: 564 U/L — ABNORMAL HIGH (ref 7–177)

## 2013-06-16 MED ORDER — DILTIAZEM HCL ER COATED BEADS 180 MG PO CP24
180.0000 mg | ORAL_CAPSULE | Freq: Every day | ORAL | Status: DC
  Administered 2013-06-16 – 2013-06-20 (×5): 180 mg via ORAL
  Filled 2013-06-16 (×5): qty 1

## 2013-06-16 MED ORDER — HYDROMORPHONE HCL 2 MG PO TABS
2.0000 mg | ORAL_TABLET | ORAL | Status: DC | PRN
  Administered 2013-06-16 – 2013-06-20 (×6): 2 mg via ORAL
  Filled 2013-06-16 (×6): qty 1

## 2013-06-16 MED ORDER — KETOROLAC TROMETHAMINE 30 MG/ML IJ SOLN: 15.0000 mg | Freq: Four times a day (QID) | INTRAMUSCULAR | Status: DC | PRN

## 2013-06-16 MED ORDER — DEXTROSE 5 % IV SOLN
1.0000 g | INTRAVENOUS | Status: DC
Start: 2013-06-16 — End: 2013-06-17
  Administered 2013-06-16 – 2013-06-17 (×2): 1 g via INTRAVENOUS
  Filled 2013-06-16 (×2): qty 10

## 2013-06-16 MED ORDER — WARFARIN SODIUM 7.5 MG PO TABS
7.5000 mg | ORAL_TABLET | Freq: Once | ORAL | Status: AC
  Administered 2013-06-16: 7.5 mg via ORAL
  Filled 2013-06-16: qty 1

## 2013-06-16 NOTE — Progress Notes (Addendum)
[facility] PROGRESS NOTE  NORRINE TRUGLIO [id number] DOB: 1961-02-28 DOA: 06/14/2013 PCP: Renato Shin, MD Brief HPI:  Patient is a 53 year old female with history of prior stroke with right-sided residual weakness, history of antiphospholipid syndrome on Coumadin, renal transplant on immunosuppressants, recurrent DVT, PE, diabetes was brought to the ER by her family after found in the bathtub today, where she was lying whole night. Family members noticed that she does have left-sided facial droop at baseline which is worse, they also feel her right arm and leg weakness is also worsened along with the speech/dysarthria. CT head is negative for any acute stroke. She was admitted for CVA work up.    Assessment/Plan: Acute encephalopathy with a worsened right-sided weakness, history of prior multiple strokes  MRI does not show any acute infarct . Encephalomalacia related to left MCA infarct.  subtherapeutic INR, aspirin added.  Neurology has been consulted we'll follow recommendations.  EEG does not show epileptiform activity.  Echocardiogram  Reveals the estimated ejection fraction was in the range of 60% to 65%. Wall motion was normal; there were no regional wall motion abnormalities. Doppler parameters are consistent with abnormal left ventricular relaxation (grade 1 diastolic dysfunction). PT evaluated and recommended CIR.  S/p fall with multiple bruises ov \\er  the knees and right elbow, X RAYS ruled out acute fractures.   HYPERLIPIDEMIA  - , continue Zocor  HYPERTENSION  - Continue Cardizem  Type II or unspecified type diabetes mellitus with renal manifestations, uncontrolled(250.42)  - Continue sliding scale insulin,. Rhabdomyolysis  - Placed on IV fluid hydration , ck level improved.  Hypokalemia: Placed on potassium replacement  History of renal transplant  - Continue prednisone, Prograf, CellCept   Abnormal UA; Urine culture ordered. Rocephin ordered.   DVT prophylaxis.       Code Status: full code Family Communication: discussed the plan of care with the patient's daughter at bedside Disposition Plan: possibly in am.    Consultants:  neurology  Procedures:  MRI brain   MRA HEAD and neck  Echocardiogram   Carotid duplex  Antibiotics:  none  HPI/Subjective: Reports pain in the right shoulder.   Objective: Filed Vitals:   06/16/13 1430  BP: 155/87  Pulse: 101  Temp: 98.8 F (37.1 C)  Resp: 18   No intake or output data in the 24 hours ending 06/16/13 1543 Filed Weights   06/14/13 1700  Weight: 78.3 kg (172 lb 9.9 oz)    Exam:   General:  arousable afebrile comfortable, aslong as we dont move her.   Cardiovascular: s1s2  Respiratory: ctab  Abdomen: soft NT ND BS+  Musculoskeletal: TRACE PEDAL EDEMA, multiple bruising over the right elbow, right and left knee  Data Reviewed: Basic Metabolic Panel:  Recent Labs Lab 06/14/13 1303 06/15/13 0513 06/16/13 0545  NA 141 142 143  K 3.1* 3.5* 3.7  CL 102 108 111  CO2 23 21 22   GLUCOSE 117* 112* 87  BUN 13 15 13   CREATININE 0.55 0.71 0.58  CALCIUM 9.5 8.5 8.7   Liver Function Tests:  Recent Labs Lab 06/14/13 1303  AST 67*  ALT 25  ALKPHOS 74  BILITOT 1.0  PROT 7.4  ALBUMIN 3.3*    Recent Labs Lab 06/14/13 1303  LIPASE 17   No results found for this basename: AMMONIA,  in the last 168 hours CBC:  Recent Labs Lab 06/14/13 1303 06/15/13 0513  WBC 12.4* 10.5  NEUTROABS 9.9*  --   HGB 13.6 12.0  HCT 40.5  36.3  MCV 87.9 90.5  PLT 157 140*   Cardiac Enzymes:  Recent Labs Lab 06/14/13 1303 06/15/13 0513 06/16/13 0545  CKTOTAL 2232* 1295* 564*   BNP (last 3 results) No results found for this basename: PROBNP,  in the last 8760 hours CBG:  Recent Labs Lab 06/14/13 2252 06/15/13 2140 06/16/13 0516 06/16/13 1128  GLUCAP 125* 99 82 90    No results found for this or any previous visit (from the past 240 hour(s)).   Studies: Mr  Brain Wo Contrast  06/14/2013   CLINICAL DATA:  History of prior stroke, antiphospholipid syndrome on Coumadin, renal transplant on immunosuppressants, and recurrent DVT, PE, and diabetes. Worsened left-sided facial droop and worsened right arm and leg weakness with dysarthria.  EXAM: MRI HEAD WITHOUT CONTRAST  MRA HEAD WITHOUT CONTRAST  TECHNIQUE: Multiplanar, multiecho pulse sequences of the brain and surrounding structures were obtained without intravenous contrast. Angiographic images of the head were obtained using MRA technique without contrast.  COMPARISON:  Head CT 06/14/2013 and brain MRI 03/23/2012. Head MRA 03/15/2010.  FINDINGS: MRI HEAD FINDINGS  There is no evidence of acute infarct. Encephalomalacia is again seen related to remote left MCA infarct involving the left frontal and parietal lobes, opercular region, and basal ganglia with associated ex vacuo dilatation of the left lateral ventricle and susceptibility artifact compatible with associated prior hemorrhage. Remote bilateral cerebellar infarcts are again seen as well. There is no evidence of mass, midline shift, or extra-axial fluid collection. Orbits and visualized paranasal sinuses and mastoid air cells are unremarkable.  MRA HEAD FINDINGS  Visualized distal vertebral arteries are patent. PICA origins are patent. Basilar artery is patent without stenosis. SCAs are patent. PCAs are patent with mild to moderate distal branch vessel irregularity, right greater than left, stable to slightly increased from the prior on the left.  Internal carotid arteries are patent from skullbase carotid terminus. There is mild to moderate irregularity and narrowing of the supra clinoid ICAs bilaterally, mildly increased from prior. ACA and MCA origins are patent. Visualized right MCA branches are patent and demonstrate mild to moderate irregularity, slightly increased from prior. The left M1 segment hasrecanalized, as has 1 of the M2 branches. The other main M2  trunk remains occluded. There is moderate left MCA branch vessel irregularity. There is moderate irregularity of the right A1 segment, which has increased, particularly distally. Left A2 irregularity is similar to the prior exam. No intracranial aneurysm or new major intracranial arterial occlusion is identified.  IMPRESSION: 1. No evidence of acute infarct or other acute intracranial abnormality. 2. Similar appearance of encephalomalacia related to left MCA infarct as well as bilateral cerebellar infarcts. 3. Interval partial recanalization of the left MCA as above. 4. Overall mild interval progression of atherosclerotic type changes of the anterior and posterior circulation as above.   Electronically Signed   By: Logan Bores   On: 06/14/2013 21:03   Mr Mra Head/brain Wo Cm  06/14/2013   CLINICAL DATA:  History of prior stroke, antiphospholipid syndrome on Coumadin, renal transplant on immunosuppressants, and recurrent DVT, PE, and diabetes. Worsened left-sided facial droop and worsened right arm and leg weakness with dysarthria.  EXAM: MRI HEAD WITHOUT CONTRAST  MRA HEAD WITHOUT CONTRAST  TECHNIQUE: Multiplanar, multiecho pulse sequences of the brain and surrounding structures were obtained without intravenous contrast. Angiographic images of the head were obtained using MRA technique without contrast.  COMPARISON:  Head CT 06/14/2013 and brain MRI 03/23/2012. Head MRA 03/15/2010.  FINDINGS: MRI  HEAD FINDINGS  There is no evidence of acute infarct. Encephalomalacia is again seen related to remote left MCA infarct involving the left frontal and parietal lobes, opercular region, and basal ganglia with associated ex vacuo dilatation of the left lateral ventricle and susceptibility artifact compatible with associated prior hemorrhage. Remote bilateral cerebellar infarcts are again seen as well. There is no evidence of mass, midline shift, or extra-axial fluid collection. Orbits and visualized paranasal sinuses and  mastoid air cells are unremarkable.  MRA HEAD FINDINGS  Visualized distal vertebral arteries are patent. PICA origins are patent. Basilar artery is patent without stenosis. SCAs are patent. PCAs are patent with mild to moderate distal branch vessel irregularity, right greater than left, stable to slightly increased from the prior on the left.  Internal carotid arteries are patent from skullbase carotid terminus. There is mild to moderate irregularity and narrowing of the supra clinoid ICAs bilaterally, mildly increased from prior. ACA and MCA origins are patent. Visualized right MCA branches are patent and demonstrate mild to moderate irregularity, slightly increased from prior. The left M1 segment hasrecanalized, as has 1 of the M2 branches. The other main M2 trunk remains occluded. There is moderate left MCA branch vessel irregularity. There is moderate irregularity of the right A1 segment, which has increased, particularly distally. Left A2 irregularity is similar to the prior exam. No intracranial aneurysm or new major intracranial arterial occlusion is identified.  IMPRESSION: 1. No evidence of acute infarct or other acute intracranial abnormality. 2. Similar appearance of encephalomalacia related to left MCA infarct as well as bilateral cerebellar infarcts. 3. Interval partial recanalization of the left MCA as above. 4. Overall mild interval progression of atherosclerotic type changes of the anterior and posterior circulation as above.   Electronically Signed   By: Logan Bores   On: 06/14/2013 21:03    Scheduled Meds: . aspirin  300 mg Rectal Daily   Or  . aspirin  325 mg Oral Daily  . calcitRIOL  0.25 mcg Oral Daily  . darifenacin  15 mg Oral Daily  . diltiazem  180 mg Oral Daily  . folic acid  1 mg Oral Daily  . mycophenolate  250 mg Oral BID  . predniSONE  1 mg Oral Q breakfast  . sertraline  100 mg Oral Daily  . simvastatin  10 mg Oral Daily  . tacrolimus  0.5 mg Oral BID  . warfarin  7.5  mg Oral ONCE-1800  . Warfarin - Pharmacist Dosing Inpatient   Does not apply q1800   Continuous Infusions: . sodium chloride 100 mL/hr at 06/16/13 0605    Principal Problem:   Acute encephalopathy Active Problems:   HYPERLIPIDEMIA   HYPERTENSION   Type II or unspecified type diabetes mellitus with renal manifestations, uncontrolled(250.42)   History of stroke with residual effects   Right sided weakness   Rhabdomyolysis    Time spent: 30 min    Eulice Rutledge  [facility] Pager [phone]. If 7PM-7AM, please contact night-coverage at [email or web address], password [facility] 06/16/2013, 3:43 PM  LOS: 2 days

## 2013-06-16 NOTE — Progress Notes (Signed)
OT Cancellation Note  Patient Details Name: Connie Ruiz MRN: [id number] DOB: [birth year]   Cancelled Treatment:    Reason Eval/Treat Not Completed: Patient at procedure or test/ unavailable (SLP eval). Will re-attempt later today as time allows.  06/16/2013 Darrol Jump OTR/L Pager [phone] Office [phone]

## 2013-06-16 NOTE — Progress Notes (Signed)
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 Oxycodone-Acetaminophen Shortness Of Breath and Nausea Only  . Propoxyphene N-Acetaminophen Shortness Of Breath and Nausea Only  . Sulfonamide Derivatives Shortness Of Breath and Nausea Only  . Codeine     REACTION: nausea  . Latex     Patient Measurements: Height: 5' 4.17" (163 cm) Weight: 172 lb 9.9 oz (78.3 kg) IBW/kg (Calculated) : 55.1 Heparin Dosing Weight:   Vital Signs: Temp: 98.8 F (37.1 C) (01/10 1430) Temp src: Oral (01/10 1430) BP: 155/87 mmHg (01/10 1430) Pulse Rate: 101 (01/10 1430)  Labs:  Recent Labs  06/14/13 1303 06/14/13 1602 06/15/13 0513 06/16/13 0545  HGB 13.6  --  12.0  --   HCT 40.5  --  36.3  --   PLT 157  --  140*  --   APTT  --  25  --   --   LABPROT  --  14.3 15.8* 17.2*  INR  --  1.13 1.29 1.44  CREATININE 0.55  --  0.71 0.58  CKTOTAL 2232*  --  1295* 564*    Estimated Creatinine Clearance: 83.6 ml/min (by C-G formula based on Cr of 0.58).   Assessment: 70 YOF with history of recurrent CVAs, recurrent DVTs and PEs, DM, and APS admitted for new CVA work-up after daughter found pt with confusion. No acute intracranial finding per head CT. INR is 1.44 with  trend up.  Home coumadin dose: 5mg  daily except 7.5mg  M/F   Goal of Therapy:  INR 2-3 Monitor platelets by anticoagulation protocol: Yes   Plan:  - Coumadin 7.5mg  PO today again - Daily PT / INR - F/U change ASA to 81mg  once INR therapeutic  Hildred Laser, Pharm D 06/16/2013 3:56 PM

## 2013-06-16 NOTE — Evaluation (Signed)
Speech Language Pathology Evaluation Patient Details Name: Connie Ruiz MRN: [id number] DOB: [birth year] Today's Date: 06/16/2013 Time: [id number] SLP Time Calculation (min): 40 min  Problem List:  Patient Active Problem List   Diagnosis Date Noted  . Acute encephalopathy 06/14/2013  . Rhabdomyolysis 06/14/2013  . History of stroke with residual effects   . Right sided weakness   . Breast mass, left 04/30/2013  . Contusion of knee, left 10/18/2012  . Depressive disorder, not elsewhere classified 10/05/2012  . Encounter for long-term (current) use of other medications 10/04/2012  . Type II or unspecified type diabetes mellitus with renal manifestations, uncontrolled(250.42) 10/13/2011  . DVT (deep venous thrombosis) 06/17/2011  . Expressive aphasia 06/17/2011  . Dysphasia 06/11/2011  . Incontinence of urine 06/11/2011  . Hand pain, left 06/11/2011  . Arm pain, left 05/07/2011  . Routine general medical examination at a health care facility 04/13/2011  . Anticoagulated 01/08/2011  . Pulmonary embolism 07/16/2010  . CVA 04/17/2010  . CEREBROVASCULAR ACCIDENT, ACUTE 04/15/2010  . THYROID NODULE, LEFT 04/10/2009  . COUGH 03/26/2009  . ACUTE BRONCHITIS 08/22/2007  . KIDNEY TRANSPLANTATION, HX OF 08/22/2007  . HYPERLIPIDEMIA 08/21/2007  . GOUT 08/21/2007  . HYPERTENSION 08/21/2007  . CONGESTIVE HEART FAILURE 08/21/2007  . GERD 08/21/2007  . RENAL INSUFFICIENCY 08/21/2007  . LUPUS 08/21/2007  . OSTEOPOROSIS 08/21/2007  . DVT, HX OF 08/21/2007  . CLOSTRIDIUM DIFFICILE COLITIS, HX OF 08/21/2007   Past Medical History:  Past Medical History  Diagnosis Date  . THYROID NODULE, LEFT 04/10/2009  . DIABETES MELLITUS, TYPE II 08/21/2007  . HYPERLIPIDEMIA 08/21/2007  . GOUT 08/21/2007  . HYPERTENSION 08/21/2007    Dr. Andree Elk, [place] 08/21/2007  . CVA 04/17/2010  . CEREBROVASCULAR ACCIDENT, ACUTE 04/15/2010  . GERD 08/21/2007  . RENAL INSUFFICIENCY 08/21/2007   . LUPUS 08/21/2007  . OSTEOPOROSIS 08/21/2007    Rheumatol at [facility]  . DVT, HX OF 08/21/2007  . CLOSTRIDIUM DIFFICILE COLITIS, HX OF 08/21/2007  . KIDNEY TRANSPLANTATION, HX OF 08/22/2007    s/p renal transplant-Dr. Andree Elk, [facility]  . Pulmonary embolism 07/16/2010  . Renal failure   . Current use of long term anticoagulation     Dr. Andree Elk, [facility]  . Depression     Dr. Andree Elk, [facility]  . History of stroke with residual effects   . Right sided weakness    Past Surgical History:  Past Surgical History  Procedure Laterality Date  . Cholecystectomy    . Tubal ligation    . Kidney transplant Right 2009  . Renal biopsy, open  1981   HPI:  Patient is a 53 year old female with history of prior stroke with right-sided residual weakness, history of antiphospholipid syndrome on Coumadin, renal transplant on immunosuppressants, recurrent DVT, PE, diabetes was brought to the ER by her family after found in the bathtub today, where she was lying whole night. History was obtained from the patient's daughter who reported that she was last seen normal around noon yesterday. The daughter called the patient who lives alone several times this morning on phone however she did not pick up. Subsequently she and her home health aide found her lying in the bathtub. Been trying to obtain history from the patient she states that she went should the bathtub to take that around 3 PM but she was too weak to get up. Per daughter there was no water in the bathtub. Spoke w pt's daughter who reports that speech has worsened significantly with this admission; pt  did have chronic aphasia from previous strokes as well. Pt had been pocketing foods yesterday afternoon and was then placed NPO.    Assessment / Plan / Recommendation Clinical Impression  Pt presents with aphasia- both receptive and expressive impairments- expressive language skills more significantly impaired than receptive. Pt is responding primarily with one-word  utterances. Object naming at about 50%. Phonemic cues were helpful. Yes/ no questions at 100%. Pt followed basic 1-step but not 2-step directions; demonstrated some perseveration with responses and direction following. Pt appears aware and frustrated with speech difficulties. Pt unable to locate call bell initially but was able to do so with repetitious practice. When leaving, pt was able to verbalize "go to bathroom." Some selective attention deficits noted with TV being distraction initially. Motor speech mildly impaired due to right side weakness. Daughter reports that speech is more significantly impaired compared to previous stroke. At this time pt may have challenges expressing basic wants and needs, negatively impacting her safety. Speech will continue to follow for tx focusing on language and problem solving.    SLP Assessment  Patient needs continued Speech Lanaguage Pathology Services    Follow Up Recommendations  Inpatient Rehab    Frequency and Duration min 2x/week  2 weeks   Pertinent Vitals/Pain n/a   SLP Goals  SLP Goals Potential to Achieve Goals: Fair Potential Considerations: Severity of impairments;Previous level of function Progress/Goals/Alternative treatment plan discussed with pt/caregiver and they: Agree SLP Goal #1: Pt will correctly name items in environment with 80% accuracy and mod assist. SLP Goal #2: Pt will verbalize basic wants and needs with 80% accuracy and mod assist. SLP Goal #3: Pt will follow 2-step functional directions with 80% accuracy and mod assist. SLP Goal #4: Pt will complete basic verbal and functional problem solving tasks with 80% accuracy and mod assist.  SLP Evaluation Prior Functioning  Cognitive/Linguistic Baseline: Baseline deficits Baseline deficit details: Daughter reported "some trouble with speech" but that it has gotten a lot worse with this admission Type of Home: Apartment Available Help at Discharge: Family;Personal care  attendant   Cognition  Overall Cognitive Status: Impaired/Different from baseline Arousal/Alertness: Awake/alert Orientation Level: Oriented to person;Oriented to place;Oriented to situation (unable to verbalize O. to time but correct w choices) Attention: Focused;Sustained;Selective Focused Attention: Appears intact Sustained Attention: Appears intact Selective Attention: Impaired Selective Attention Impairment: Verbal complex;Functional complex Memory:  (unable to assess given language deficits) Problem Solving: Impaired Problem Solving Impairment: Functional complex;Verbal complex;Functional basic Behaviors: Physical agitation Safety/Judgment: Impaired    Comprehension  Auditory Comprehension Overall Auditory Comprehension: Impaired Yes/No Questions: Within Functional Limits Commands: Impaired One Step Basic Commands: 75-100% accurate Two Step Basic Commands: 0-24% accurate Conversation: Simple Interfering Components: Pain EffectiveTechniques: Repetition;Visual/Gestural cues;Other (Comment) (shortened instructions/ sentences) Reading Comprehension Reading Status: Not tested    Expression Expression Primary Mode of Expression: Verbal Verbal Expression Overall Verbal Expression: Impaired Initiation: No impairment Automatic Speech: Counting;Month of year;Name (attempted months of year but could not complete) Level of Generative/Spontaneous Verbalization: Word;Phrase Repetition: Impaired Level of Impairment: Sentence level Naming: Impairment Responsive: 51-75% accurate Confrontation: Impaired Convergent: 50-74% accurate Verbal Errors: Aware of errors;Perseveration Pragmatics: No impairment Interfering Components: Premorbid deficit Effective Techniques: Phonemic cues;Sentence completion;Semantic cues Written Expression Dominant Hand: Left Written Expression: Not tested   Oral / Motor Oral Motor/Sensory Function Overall Oral Motor/Sensory Function: Impaired Labial  ROM: Reduced right Labial Symmetry: Abnormal symmetry right Labial Strength: Reduced Lingual ROM: Within Functional Limits Lingual Symmetry: Within Functional Limits Facial ROM: Reduced right Motor Speech  Overall Motor Speech: Impaired Respiration: Within functional limits Phonation: Normal Resonance: Within functional limits Articulation: Impaired Level of Impairment: Sentence Intelligibility: Intelligibility reduced Word: 75-100% accurate Phrase: 75-100% accurate Sentence: 75-100% accurate Motor Planning: Witnin functional limits Interfering Components: Premorbid status   GO Functional Limitations: Spoken language expressive Swallow Current Status [id number]): At least 20 percent but less than 40 percent impaired, limited or restricted Swallow Goal Status [phone]): At least 1 percent but less than 20 percent impaired, limited or restricted Spoken Language Expression Current Status [phone]): At least 60 percent but less than 80 percent impaired, limited or restricted Spoken Language Expression Goal Status [id number]): At least 40 percent but less than 60 percent impaired, limited or restricted   Kern Reap, MA, CCC-SLP 06/16/2013, 10:25 AM

## 2013-06-16 NOTE — Progress Notes (Addendum)
06/15/13 1600  PT Visit Information  Last PT Received On 06/15/13  Assistance Needed +2  History of Present Illness Connie Ruiz is an 53 y.o. female with a history of antiphospholipid antibody syndrome, renal transplant on immune suppressants, previous strokes, DVT with pulmonary embolus and diabetes mellitus who was found in her bathtub, having been there apparently overnight and unable to get to her feet. MRI showed no signs of recurrent acute stroke  Precautions  Precautions Fall  Restrictions  Weight Bearing Restrictions No  Home Living  Family/patient expects to be discharged to: Private residence  Available Help at Discharge Family;Personal care attendant (aide m-f 3 hrs)  Type of [place] Access Level entry  Home Layout One level  [place] - 2 wheels;Cane - single point;BSC;Shower seat (has order for rollator walker)  Prior Function  Level of Independence Needs assistance  Gait / Transfers Assistance Needed independent without device   ADL's / [place] assist for bathing and some ADLs   Cognition  Arousal/Alertness Awake/alert  Behavior During Therapy Anxious;Flat affect  Overall Cognitive Status Impaired/Different from baseline  Area of Impairment Orientation;Problem solving  Orientation Level Place;Time;Situation  Problem Solving Slow processing;Decreased initiation;Difficulty sequencing  General Comments patient providing incorrect answers to home setup, daughter correcting (daughter stated these would be normal questions for patient to answer) patient also having difficulty with word finding, when prompted by question "is your home one level or do you have steps" patient mutters several unintelligible words required some additional cues to be able to answer appropraitely. patient also with poor attention to task.  Patient note to be pocketing food upon arrival on right cheeck as well and kept continuing to try to fit more crackers  into her mouth not realizing that she had an entire mouth full of food.  Upper Extremity Assessment  Upper Extremity Assessment RUE deficits/detail  RUE Deficits / Details at rest pt presents with RUE in elbow flxion, wrist flexion and pseudo tenodesis positioning (bruising noted on R shoulder)  RUE Unable to fully assess due to pain  RUE Sensation decreased light touch;decreased proprioception  Lower Extremity Assessment  Lower Extremity Assessment RLE deficits/detail;LLE deficits/detail;Difficult to assess due to impaired cognition  RLE Deficits / Details patient with immediate pain withdrawl upon light touch  RLE Unable to fully assess due to pain  LLE Deficits / Details patient unable to complete [place] against any resistance without withdrawling  LLE Unable to fully assess due to pain  Bed Mobility  Overal bed mobility Needs Assistance  Bed Mobility Supine to Sit;Sit to Supine  Supine to sit Mod assist;+2 for physical assistance  Sit to supine Mod assist;+2 for physical assistance  General bed mobility comments Patient will not allow any physical assist or movement of RUE, withdrawls in pain.    PT - End of Session  Activity Tolerance Patient limited by pain  Patient left in bed;with call bell/phone within reach;with bed alarm set;with family/visitor present  Nurse Communication Mobility status;Other (comment) (Patient pocketing substantial amount of food right side)  PT Assessment  PT Recommendation/Assessment Patient needs continued PT services  PT Problem List Decreased strength;Decreased range of motion;Decreased activity tolerance;Decreased mobility;Decreased cognition;Impaired sensation;Impaired tone;Pain  Barriers to Discharge Decreased caregiver support  Barriers to Discharge Comments aide for 3 hours M-F  PT Therapy Diagnosis  Hemiplegia non-dominant side;Altered mental status  PT Plan  PT Frequency Min 3X/week  PT Treatment/Interventions DME instruction;Gait  training;Functional mobility training;Therapeutic activities;Therapeutic exercise;Balance training;Patient/family education  PT Recommendation  Follow Up Recommendations CIR;Supervision/Assistance - 24 hour  PT equipment Other (comment) (TBD)  Individuals Consulted  Consulted and Agree with Results and Recommendations Patient;Family member/caregiver  Family Member Consulted daughter  Acute Rehab PT Goals  Patient Stated Goal none stated  PT Goal Formulation With patient/family  Time For Goal Achievement 06/29/13  Potential to Achieve Goals Fair  PT Time Calculation  PT Start Time 1517  PT Stop Time 1543  PT Time Calculation (min) 26 min  PT General Charges  $$ ACUTE PT VISIT 1 Procedure  PT Evaluation  $Initial PT Evaluation Tier I 1 Procedure  PT Treatments  $Therapeutic Activity 23-37 mins  Written Expression  Dominant Hand Left   Late entry, PT evaluation    06/30/13 1600  PT G-Codes **NOT FOR INPATIENT CLASS**  Functional Assessment Tool Used clinical judgement  Functional Limitation Mobility: Walking and moving around  Mobility: Walking and Moving Around Current Status [id number]) CM  Mobility: Walking and Moving Around Goal Status [id number]) CI     Alben Deeds, PT DPT  [phone]

## 2013-06-16 NOTE — Evaluation (Signed)
Clinical/Bedside Swallow Evaluation Patient Details  Name: Connie Ruiz MRN: [id number] Date of Birth: April 14, 1961  Today's Date: 06/16/2013 Time: [id number] SLP Time Calculation (min): 40 min  Past Medical History:  Past Medical History  Diagnosis Date  . THYROID NODULE, LEFT 04/10/2009  . DIABETES MELLITUS, TYPE II 08/21/2007  . HYPERLIPIDEMIA 08/21/2007  . GOUT 08/21/2007  . HYPERTENSION 08/21/2007    Dr. Andree Elk, [place] 08/21/2007  . CVA 04/17/2010  . CEREBROVASCULAR ACCIDENT, ACUTE 04/15/2010  . GERD 08/21/2007  . RENAL INSUFFICIENCY 08/21/2007  . LUPUS 08/21/2007  . OSTEOPOROSIS 08/21/2007    Rheumatol at [facility]  . DVT, HX OF 08/21/2007  . CLOSTRIDIUM DIFFICILE COLITIS, HX OF 08/21/2007  . KIDNEY TRANSPLANTATION, HX OF 08/22/2007    s/p renal transplant-Dr. Andree Elk, [facility]  . Pulmonary embolism 07/16/2010  . Renal failure   . Current use of long term anticoagulation     Dr. Andree Elk, [facility]  . Depression     Dr. Andree Elk, [facility]  . History of stroke with residual effects   . Right sided weakness    Past Surgical History:  Past Surgical History  Procedure Laterality Date  . Cholecystectomy    . Tubal ligation    . Kidney transplant Right 2009  . Renal biopsy, open  1981   HPI:  Patient is a 53 year old female with history of prior stroke with right-sided residual weakness, history of antiphospholipid syndrome on Coumadin, renal transplant on immunosuppressants, recurrent DVT, PE, diabetes was brought to the ER by her family after found in the bathtub today, where she was lying whole night. History was obtained from the patient's daughter who reported that she was last seen normal around noon yesterday. The daughter called the patient who lives alone several times this morning on phone however she did not pick up. Subsequently she and her home health aide found her lying in the bathtub. Been trying to obtain history from the patient she states that she went  should the bathtub to take that around 3 PM but she was too weak to get up. Per daughter there was no water in the bathtub. Spoke w pt's daughter who reports that speech has worsened significantly with this admission; pt did have chronic aphasia from previous strokes as well. Pt had been pocketing foods yesterday afternoon and was then placed NPO.    Assessment / Plan / Recommendation Clinical Impression  Pt did not demonstrate any overt s/s of aspiration at bedside. Pt did demonstrate prolonged oral phase with solid food, accompanied by multiple swallows and some oral residue on tongue; no pocketing was noted during evaluation but was noted yesterday afternoon. Pt demonstrated some decreased attention which is also a risk factor for oral residuals, along with decreased strength on the right side. Given these findings, pt is at a mild-mod risk of aspiration without supervision to check for pocketing of foods or oral holding. Rx dysphagia 3 diet, thin liquids, full supervision to check for pocketing/ oral holding. Speech will continue to follow for diet check/ advancement.    Aspiration Risk  Mild    Diet Recommendation Dysphagia 3 (Mechanical Soft);Thin liquid   Liquid Administration via: Cup;Straw Medication Administration: Whole meds with liquid Supervision: Full supervision/cueing for compensatory strategies Compensations: Slow rate;Check for pocketing;Small sips/bites Postural Changes and/or Swallow Maneuvers: Seated upright 90 degrees    Other  Recommendations Oral Care Recommendations: Oral care BID   Follow Up Recommendations       Frequency and Duration min  2x/week  2 weeks   Pertinent Vitals/Pain n/a    SLP Swallow Goals     Swallow Study Prior Functional Status       General HPI: Patient is a 53 year old female with history of prior stroke with right-sided residual weakness, history of antiphospholipid syndrome on Coumadin, renal transplant on immunosuppressants,  recurrent DVT, PE, diabetes was brought to the ER by her family after found in the bathtub today, where she was lying whole night. History was obtained from the patient's daughter who reported that she was last seen normal around noon yesterday. The daughter called the patient who lives alone several times this morning on phone however she did not pick up. Subsequently she and her home health aide found her lying in the bathtub. Been trying to obtain history from the patient she states that she went should the bathtub to take that around 3 PM but she was too weak to get up. Per daughter there was no water in the bathtub. Spoke w pt's daughter who reports that speech has worsened significantly with this admission; pt did have chronic aphasia from previous strokes as well. Pt had been pocketing foods yesterday afternoon and was then placed NPO.  Type of Study: Bedside swallow evaluation Diet Prior to this Study: NPO Temperature Spikes Noted:  (slightly elevated- 99 today) Respiratory Status: Room air History of Recent Intubation: No Behavior/Cognition: Alert;Cooperative;Distractible Oral Cavity - Dentition: Adequate natural dentition Self-Feeding Abilities: Needs assist Patient Positioning: Upright in bed Baseline Vocal Quality: Clear Volitional Cough: Strong Volitional Swallow: Unable to elicit    Oral/Motor/Sensory Function Overall Oral Motor/Sensory Function: Impaired Labial ROM: Reduced right Labial Symmetry: Abnormal symmetry right Labial Strength: Reduced Lingual ROM: Within Functional Limits Lingual Symmetry: Within Functional Limits Facial ROM: Reduced right   Ice Chips Ice chips: Not tested   Thin Liquid Thin Liquid: Within functional limits Presentation: Cup;Straw    Nectar Thick Nectar Thick Liquid: Not tested   Honey Thick Honey Thick Liquid: Not tested   Puree Puree: Within functional limits Presentation: Spoon   Solid   GO Functional Limitations: Swallowing Swallow  Current Status [id number]): At least 20 percent but less than 40 percent impaired, limited or restricted Swallow Goal Status [phone]): At least 1 percent but less than 20 percent impaired, limited or restricted  Solid: Impaired Presentation: Self Fed Oral Phase Functional Implications: Oral residue (some oral residue on tongue; prolonged oral phase) Pharyngeal Phase Impairments: Multiple swallows       [place], Amy K, MA, CCC-SLP 06/16/2013,9:56 AM

## 2013-06-17 ENCOUNTER — Observation Stay (HOSPITAL_COMMUNITY): Payer: PRIVATE HEALTH INSURANCE

## 2013-06-17 DIAGNOSIS — Z940 Kidney transplant status: Secondary | ICD-10-CM

## 2013-06-17 LAB — PROTIME-INR
INR: 1.47 (ref 0.00–1.49)
Prothrombin Time: 17.4 seconds — ABNORMAL HIGH (ref 11.6–15.2)

## 2013-06-17 LAB — GLUCOSE, CAPILLARY
Glucose-Capillary: 114 mg/dL — ABNORMAL HIGH (ref 70–99)
Glucose-Capillary: 120 mg/dL — ABNORMAL HIGH (ref 70–99)
Glucose-Capillary: 122 mg/dL — ABNORMAL HIGH (ref 70–99)
Glucose-Capillary: 115 mg/dL — ABNORMAL HIGH (ref 70–99)

## 2013-06-17 MED ORDER — WARFARIN SODIUM 10 MG PO TABS
10.0000 mg | ORAL_TABLET | Freq: Once | ORAL | Status: AC
  Administered 2013-06-17: 10 mg via ORAL
  Filled 2013-06-17: qty 1

## 2013-06-17 MED ORDER — HEPARIN (PORCINE) IN NACL 100-0.45 UNIT/ML-% IJ SOLN
1250.0000 [IU]/h | INTRAMUSCULAR | Status: DC
  Administered 2013-06-17: 1050 [IU]/h via INTRAVENOUS
  Administered 2013-06-18: 1250 [IU]/h via INTRAVENOUS
  Filled 2013-06-17 (×3): qty 250

## 2013-06-17 MED ORDER — LOSARTAN POTASSIUM 50 MG PO TABS
50.0000 mg | ORAL_TABLET | Freq: Every day | ORAL | Status: DC
  Administered 2013-06-17 – 2013-06-20 (×4): 50 mg via ORAL
  Filled 2013-06-17 (×4): qty 1

## 2013-06-17 NOTE — Consult Note (Addendum)
WOC wound consult note Reason for Consult: Consult requested for right elbow wound.  Pt fell at home and lay in a bathtub for prolonged, unknown period of time.   Wound type: Unstageable pressure ulcer Pressure Ulcer POA: Yes Measurement:4.5X2cm Wound bed: 100% eschar, fluctuant when probed with swab. Drainage (amount, consistency, odor) Small amt yellow fluid Periwound: Edema and erythremia to entire elbow; pt guarding site and very painful when touched. Dressing procedure/placement/frequency: Recommend ortho consult for debridement of nonviable tissue.  Joints in elbow area can be affected if there is an abscess under the nonviable layer of wound.  Discussed plan of care with primary team.  Applied moist gauze and kerlex until further recommendations available. Please re-consult if further assistance is needed.  Thank-you,  Julien Girt MSN, [place], [place], [place], [place]

## 2013-06-17 NOTE — Progress Notes (Addendum)
[facility] PROGRESS NOTE  Connie Ruiz [id number] DOB: [birth year] DOA: 06/14/2013 PCP: Renato Shin, MD Brief HPI:  Patient is a 53 year old female with history of prior stroke with right-sided residual weakness, history of antiphospholipid syndrome on Coumadin, renal transplant on immunosuppressants, recurrent DVT, PE, diabetes was brought to the ER by her family after found in the bathtub today, where she was lying whole night. Family members noticed that she does have left-sided facial droop at baseline which is worse, they also feel her right arm and leg weakness is also worsened along with the speech/dysarthria. CT head is negative for any acute stroke. She was admitted for CVA work up.    Assessment/Plan: Acute encephalopathy with a worsened right-sided weakness, history of prior multiple strokes  MRI does not show any acute infarct . Encephalomalacia related to left MCA infarct.  subtherapeutic INR, aspirin added.  Neurology has been consulted we'll follow recommendations.  EEG does not show epileptiform activity.  Echocardiogram  Reveals the estimated ejection fraction was in the range of 60% to 65%. Wall motion was normal; there were no regional wall motion abnormalities. Doppler parameters are consistent with abnormal left ventricular relaxation (grade 1 diastolic dysfunction). PT evaluated and recommended CIR.  She is resumed on her coumadin, but as her INR is sub therapeutic , we will start her on IV heparin to bridge it.  Further recommendations as per neurology.   HYPERLIPIDEMIA  - , continue Zocor  HYPERTENSION  - Continue Cardizem  Type II or unspecified type diabetes mellitus with renal manifestations, uncontrolled(250.42)  - Continue sliding scale insulin,. CBG (last 3)   Recent Labs  06/17/13 0700 06/17/13 1153 06/17/13 1702  GLUCAP 114* 120* 122*     Rhabdomyolysis  - Placed on IV fluid hydration , ck level improved.  Hypokalemia: Placed on  potassium replacement  History of renal transplant  - Continue prednisone, Prograf, CellCept   Abnormal UA; Urine culture ordered and negative. Will d/c rocephin.  Multiple scab wounds on the knees and ont he right elbow : x rays have been negative for abscess or fractures.   DVT prophylaxis.      Code Status: full code Family Communication: discussed the plan of care with the patient's daughter at bedside Disposition Plan: possibly in am. After inpatient rehab evaluates her.   Consultants:  neurology  Procedures:  MRI brain   MRA HEAD and neck  Echocardiogram   Carotid duplex  Antibiotics:  none  HPI/Subjective: Reports pain in the right shoulder. And right elbow.   Objective: Filed Vitals:   06/17/13 1013  BP: 176/93  Pulse: 110  Temp: 98.9 F (37.2 C)  Resp: 18   No intake or output data in the 24 hours ending 06/17/13 1023 Filed Weights   06/14/13 1700  Weight: 78.3 kg (172 lb 9.9 oz)    Exam:   General:  alert afebrile in mild distress from the pain in the right elbow.   Cardiovascular: s1s2  Respiratory: ctab  Abdomen: soft NT ND BS+  Musculoskeletal: TRACE PEDAL EDEMA. Multiple skin breaks over the right elbow and ont he knees.   Data Reviewed: Basic Metabolic Panel:  Recent Labs Lab 06/14/13 1303 06/15/13 0513 06/16/13 0545  NA 141 142 143  K 3.1* 3.5* 3.7  CL 102 108 111  CO2 23 21 22   GLUCOSE 117* 112* 87  BUN 13 15 13   CREATININE 0.55 0.71 0.58  CALCIUM 9.5 8.5 8.7   Liver Function Tests:  Recent Labs Lab  06/14/13 1303  AST 67*  ALT 25  ALKPHOS 74  BILITOT 1.0  PROT 7.4  ALBUMIN 3.3*    Recent Labs Lab 06/14/13 1303  LIPASE 17   No results found for this basename: AMMONIA,  in the last 168 hours CBC:  Recent Labs Lab 06/14/13 1303 06/15/13 0513  WBC 12.4* 10.5  NEUTROABS 9.9*  --   HGB 13.6 12.0  HCT 40.5 36.3  MCV 87.9 90.5  PLT 157 140*   Cardiac Enzymes:  Recent Labs Lab  06/14/13 1303 06/15/13 0513 06/16/13 0545  CKTOTAL 2232* 1295* 564*   BNP (last 3 results) No results found for this basename: PROBNP,  in the last 8760 hours CBG:  Recent Labs Lab 06/16/13 0516 06/16/13 1128 06/16/13 1637 06/16/13 2147 06/17/13 0700  GLUCAP 82 90 110* 126* 114*    No results found for this or any previous visit (from the past 240 hour(s)).   Studies: No results found.  Scheduled Meds: . aspirin  300 mg Rectal Daily   Or  . aspirin  325 mg Oral Daily  . calcitRIOL  0.25 mcg Oral Daily  . cefTRIAXone (ROCEPHIN)  IV  1 g Intravenous Q24H  . darifenacin  15 mg Oral Daily  . diltiazem  180 mg Oral Daily  . folic acid  1 mg Oral Daily  . losartan  50 mg Oral Daily  . mycophenolate  250 mg Oral BID  . predniSONE  1 mg Oral Q breakfast  . sertraline  100 mg Oral Daily  . simvastatin  10 mg Oral Daily  . tacrolimus  0.5 mg Oral BID  . warfarin  10 mg Oral ONCE-1800  . 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 If 7PM-7AM, please contact night-coverage at [email or web address], password [facility] 06/17/2013, 10:23 AM  LOS: 3 days

## 2013-06-17 NOTE — Progress Notes (Signed)
ANTICOAGULATION CONSULT NOTE - Initial Consult  Pharmacy Consult for Heparin Indication: Antiphospholipid syndrome with hx strokes, DVT and PE  Allergies  Allergen Reactions  . Oxycodone-Acetaminophen Shortness Of Breath and Nausea Only  . Propoxyphene N-Acetaminophen Shortness Of Breath and Nausea Only  . Sulfonamide Derivatives Shortness Of Breath and Nausea Only  . Codeine     REACTION: nausea  . Latex     Patient Measurements: Height: 5' 4.17" (163 cm) Weight: 172 lb 9.9 oz (78.3 kg) IBW/kg (Calculated) : 55.1 Heparin Dosing Weight:   Vital Signs: Temp: 98.9 F (37.2 C) (01/11 1013) Temp src: Oral (01/11 1013) BP: 176/93 mmHg (01/11 1013) Pulse Rate: 110 (01/11 1013)  Labs:  Recent Labs  06/14/13 1303  06/14/13 1602 06/15/13 0513 06/16/13 0545 06/17/13 0455  HGB 13.6  --   --  12.0  --   --   HCT 40.5  --   --  36.3  --   --   PLT 157  --   --  140*  --   --   APTT  --   --  25  --   --   --   LABPROT  --   < > 14.3 15.8* 17.2* 17.4*  INR  --   < > 1.13 1.29 1.44 1.47  CREATININE 0.55  --   --  0.71 0.58  --   CKTOTAL 2232*  --   --  1295* 564*  --   < > = values in this interval not displayed.  Estimated Creatinine Clearance: 83.6 ml/min (by C-G formula based on Cr of 0.58).   Medical History: Past Medical History  Diagnosis Date  . THYROID NODULE, LEFT 04/10/2009  . DIABETES MELLITUS, TYPE II 08/21/2007  . HYPERLIPIDEMIA 08/21/2007  . GOUT 08/21/2007  . HYPERTENSION 08/21/2007    Dr. Andree Elk, [place] 08/21/2007  . CVA 04/17/2010  . CEREBROVASCULAR ACCIDENT, ACUTE 04/15/2010  . GERD 08/21/2007  . RENAL INSUFFICIENCY 08/21/2007  . LUPUS 08/21/2007  . OSTEOPOROSIS 08/21/2007    Rheumatol at [facility]  . DVT, HX OF 08/21/2007  . CLOSTRIDIUM DIFFICILE COLITIS, HX OF 08/21/2007  . KIDNEY TRANSPLANTATION, HX OF 08/22/2007    s/p renal transplant-Dr. Andree Elk, [facility]  . Pulmonary embolism 07/16/2010  . Renal failure   . Current use of long  term anticoagulation     Dr. Andree Elk, [facility]  . Depression     Dr. Andree Elk, [facility]  . History of stroke with residual effects   . Right sided weakness     Medications:  Scheduled:  . aspirin  300 mg Rectal Daily   Or  . aspirin  325 mg Oral Daily  . calcitRIOL  0.25 mcg Oral Daily  . cefTRIAXone (ROCEPHIN)  IV  1 g Intravenous Q24H  . darifenacin  15 mg Oral Daily  . diltiazem  180 mg Oral Daily  . folic acid  1 mg Oral Daily  . losartan  50 mg Oral Daily  . mycophenolate  250 mg Oral BID  . predniSONE  1 mg Oral Q breakfast  . sertraline  100 mg Oral Daily  . simvastatin  10 mg Oral Daily  . tacrolimus  0.5 mg Oral BID  . warfarin  10 mg Oral ONCE-1800  . Warfarin - Pharmacist Dosing Inpatient   Does not apply q1800    Assessment: 53yo female with history of strokes, DVT, PE, and APS to add heparin bridge while INR sub-therapeutic.  INR 1.47 this  AM.  Hg 12 and pltc 140.  Goal of Therapy:  Heparin level 0.3-0.7 units/ml Monitor platelets by anticoagulation protocol: Yes   Plan:  Heparin 1050 units/hr Heparin level 8hr Daily HL, CBC  Gracy Bruins, PharmD Clinical Pharmacist [facility]

## 2013-06-17 NOTE — Progress Notes (Signed)
Occupational Therapy Evaluation Patient Details Name: Connie Ruiz MRN: [id number] DOB: 05-26-1961 Today's Date: 06/17/2013 Time: 1050-1103 OT Time Calculation (min): 13 min  OT Assessment / Plan / Recommendation History of present illness Connie Ruiz is an 53 y.o. female with a history of antiphospholipid antibody syndrome, renal transplant on immune suppressants, previous strokes, DVT with pulmonary embolus and diabetes mellitus who was found in her bathtub, having been there apparently overnight and unable to get to her feet. MRI showed no signs of recurrent acute stroke  OT Assessment  Patient needs continued OT Services    Follow Up Recommendations  CIR;SNF    Barriers to Discharge Decreased caregiver support    Equipment Recommendations  None recommended by OT    Recommendations for Other Services    Frequency  Min 2X/week    Precautions / Restrictions Precautions Precautions: Fall   Pertinent Vitals/Pain Extreme pain with RUE touch/movement    ADL  Eating/Feeding: Simulated;Supervision/safety;Set up Where Assessed - Eating/Feeding: Bed level Grooming: Wash/dry face;Set up Where Assessed - Grooming: Supine, head of bed up Upper Body Bathing: Simulated;+1 Total assistance Where Assessed - Upper Body Bathing: Supine, head of bed up Lower Body Bathing: Simulated;+1 Total assistance Where Assessed - Lower Body Bathing: Supine, head of bed up Transfers/Ambulation Related to ADLs: Deferred due to no +2 A and patient not wanting to mobilize. ADL Comments: Patient able to use LUE for some ADLs. Very sensitive to touch and movement RUE.     OT Diagnosis: Blindness and low vision;Acute pain;Generalized weakness  OT Problem List: Decreased strength;Decreased range of motion;Decreased activity tolerance;Impaired balance (sitting and/or standing);Decreased safety awareness;Pain;Impaired UE functional use;Decreased knowledge of use of DME or AE OT Treatment Interventions:  Self-care/ADL training;Therapeutic exercise;Neuromuscular education;DME and/or AE instruction;Therapeutic activities;Patient/family education   OT Goals(Current goals can be found in the care plan section) Acute Rehab OT Goals Patient Stated Goal: none stated OT Goal Formulation: With patient Time For Goal Achievement: 07/01/13 Potential to Achieve Goals: Fair  Visit Information  Last OT Received On: 06/17/13 Reason Eval/Treat Not Completed: Pain limiting ability to participate History of Present Illness: Connie Ruiz is an 53 y.o. female with a history of antiphospholipid antibody syndrome, renal transplant on immune suppressants, previous strokes, DVT with pulmonary embolus and diabetes mellitus who was found in her bathtub, having been there apparently overnight and unable to get to her feet. MRI showed no signs of recurrent acute stroke       Prior Functioning     Home Living Family/patient expects to be discharged to:: Private residence Living Arrangements: Alone Available Help at Discharge: Family;Personal care attendant Type of Home: Apartment Home Access: Level entry Home Layout: One level Home Equipment: [place] - 2 wheels;Cane - single point;Bedside commode;Shower seat Prior Function Level of Independence: Needs assistance Gait / Transfers Assistance Needed: independent without device  ADL's / Homemaking Assistance Needed: assist for bathing and some ADLs  Communication / Swallowing Assistance Needed: aphasia noted Communication Communication: Expressive difficulties;Receptive difficulties Dominant Hand: Left         Vision/Perception     Cognition  Cognition Arousal/Alertness: Awake/alert Behavior During Therapy: Anxious;Flat affect Overall Cognitive Status: Impaired/Different from baseline Area of Impairment: Orientation;Problem solving Orientation Level: Place;Time;Situation Problem Solving: Slow processing;Decreased initiation;Difficulty  sequencing General Comments: pt poor historian, has diffulty expressing home environment and plof    Extremity/Trunk Assessment Upper Extremity Assessment Upper Extremity Assessment: RUE deficits/detail RUE Deficits / Details: at rest pt presents with RUE in elbow flxion, wrist flexion  and pseudo tenodesis positioning RUE: Unable to fully assess due to pain RUE Sensation: decreased light touch;decreased proprioception RUE Coordination: decreased fine motor;decreased gross motor Lower Extremity Assessment Lower Extremity Assessment: Defer to PT evaluation     Mobility       Exercise     Balance     End of Session OT - End of Session Activity Tolerance: Patient limited by pain Patient left: in bed;with call bell/phone within reach  GO Functional Limitation: Self care Self Care Current Status [id number]): At least 80 percent but less than 100 percent impaired, limited or restricted Self Care Goal Status [id number]): At least 20 percent but less than 40 percent impaired, limited or restricted   Leesa Leifheit A 06/17/2013, 12:19 PM

## 2013-06-17 NOTE — Progress Notes (Signed)
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 Oxycodone-Acetaminophen Shortness Of Breath and Nausea Only  . Propoxyphene N-Acetaminophen Shortness Of Breath and Nausea Only  . Sulfonamide Derivatives Shortness Of Breath and Nausea Only  . Codeine     REACTION: nausea  . Latex     Patient Measurements: Height: 5' 4.17" (163 cm) Weight: 172 lb 9.9 oz (78.3 kg) IBW/kg (Calculated) : 55.1   Vital Signs: Temp: 98.6 F (37 C) (01/11 0559) Temp src: Oral (01/11 0559) BP: 161/89 mmHg (01/11 0559) Pulse Rate: 102 (01/11 0559)  Labs:  Recent Labs  06/14/13 1303  06/14/13 1602 06/15/13 0513 06/16/13 0545 06/17/13 0455  HGB 13.6  --   --  12.0  --   --   HCT 40.5  --   --  36.3  --   --   PLT 157  --   --  140*  --   --   APTT  --   --  25  --   --   --   LABPROT  --   < > 14.3 15.8* 17.2* 17.4*  INR  --   < > 1.13 1.29 1.44 1.47  CREATININE 0.55  --   --  0.71 0.58  --   CKTOTAL 2232*  --   --  1295* 564*  --   < > = values in this interval not displayed.  Estimated Creatinine Clearance: 83.6 ml/min (by C-G formula based on Cr of 0.58).  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  INR remains subtherapeutic but increased slightly from 1.44 to 1.47 today.  No bleeding documented.  Goal of Therapy:  INR 2-3   Plan:  1) coumadin 10mg  PO x1 today  Brock Mokry P 06/17/2013,9:37 AM

## 2013-06-18 DIAGNOSIS — G929 Unspecified toxic encephalopathy: Secondary | ICD-10-CM

## 2013-06-18 DIAGNOSIS — G92 Toxic encephalopathy: Secondary | ICD-10-CM

## 2013-06-18 DIAGNOSIS — M6282 Rhabdomyolysis: Secondary | ICD-10-CM

## 2013-06-18 DIAGNOSIS — M79609 Pain in unspecified limb: Secondary | ICD-10-CM

## 2013-06-18 DIAGNOSIS — R4789 Other speech disturbances: Secondary | ICD-10-CM

## 2013-06-18 LAB — CBC
HCT: 31.1 % — ABNORMAL LOW (ref 36.0–46.0)
Hemoglobin: 10.7 g/dL — ABNORMAL LOW (ref 12.0–15.0)
MCH: 30.8 pg (ref 26.0–34.0)
MCHC: 34.4 g/dL (ref 30.0–36.0)
MCV: 89.6 fL (ref 78.0–100.0)
Platelets: 179 10*3/uL (ref 150–400)
RBC: 3.47 MIL/uL — ABNORMAL LOW (ref 3.87–5.11)
RDW: 13.8 % (ref 11.5–15.5)
WBC: 7.6 10*3/uL (ref 4.0–10.5)

## 2013-06-18 LAB — PROTIME-INR
INR: 1.67 — ABNORMAL HIGH (ref 0.00–1.49)
Prothrombin Time: 19.2 seconds — ABNORMAL HIGH (ref 11.6–15.2)

## 2013-06-18 LAB — HEPARIN LEVEL (UNFRACTIONATED)
Heparin Unfractionated: 0.16 IU/mL — ABNORMAL LOW (ref 0.30–0.70)
Heparin Unfractionated: 0.34 IU/mL (ref 0.30–0.70)

## 2013-06-18 LAB — GLUCOSE, CAPILLARY
Glucose-Capillary: 99 mg/dL (ref 70–99)
Glucose-Capillary: 149 mg/dL — ABNORMAL HIGH (ref 70–99)
Glucose-Capillary: 151 mg/dL — ABNORMAL HIGH (ref 70–99)
Glucose-Capillary: 102 mg/dL — ABNORMAL HIGH (ref 70–99)

## 2013-06-18 LAB — URINE CULTURE: Colony Count: >=100000

## 2013-06-18 MED ORDER — HEPARIN (PORCINE) IN NACL 100-0.45 UNIT/ML-% IJ SOLN
1300.0000 [IU]/h | INTRAMUSCULAR | Status: DC
Start: 2013-06-18 — End: 2013-06-19
  Administered 2013-06-18 – 2013-06-19 (×2): 1300 [IU]/h via INTRAVENOUS
  Filled 2013-06-18: qty 250

## 2013-06-18 MED ORDER — WARFARIN SODIUM 10 MG PO TABS
10.0000 mg | ORAL_TABLET | Freq: Once | ORAL | Status: AC
  Administered 2013-06-18: 10 mg via ORAL
  Filled 2013-06-18: qty 1

## 2013-06-18 NOTE — Progress Notes (Signed)
[facility] PROGRESS NOTE  Connie Ruiz [id number] DOB: 24-Oct-1960 DOA: 06/14/2013 PCP: Renato Shin, MD Brief HPI:  Patient is a 53 year old female with history of prior stroke with right-sided residual weakness, history of antiphospholipid syndrome on Coumadin, renal transplant on immunosuppressants, recurrent DVT, PE, diabetes was brought to the ER by her family after found in the bathtub today, where she was lying whole night. Family members noticed that she does have left-sided facial droop at baseline which is worse, they also feel her right arm and leg weakness is also worsened along with the speech/dysarthria. CT head is negative for any acute stroke. She was admitted for CVA work up.    Assessment/Plan:  Acute encephalopathy with a worsened right-sided weakness, history of prior multiple strokes  MRI does not show any acute infarct . Encephalomalacia related to left MCA infarct.   Neurology has been consulted. EEG does not show epileptiform activity.  Echocardiogram  Reveals the estimated ejection fraction was in the range of 60% to 65%. Wall motion was normal; there were no regional wall motion abnormalities. Doppler parameters are consistent with abnormal left ventricular relaxation (grade 1 diastolic dysfunction). PT evaluated and recommended CIR, but daughter wanted to take her to [place]. We will get social worker to facilitate that.  She is resumed on her coumadin, but as her INR is sub therapeutic , we will start her on IV heparin to bridge it.  Further recommendations as per neurology.   HYPERLIPIDEMIA  -  continue Zocor  HYPERTENSION  - Continue Cardizem   Type II or unspecified type diabetes mellitus with renal manifestations, uncontrolled(250.42)  - Continue sliding scale insulin,. CBG (last 3)   Recent Labs  06/17/13 2306 06/18/13 0655 06/18/13 1109  GLUCAP 115* 99 149*     Rhabdomyolysis  - Placed on IV fluid hydration , ck level improved.    Hypokalemia: Placed on potassium replacement   History of renal transplant  - Continue prednisone, Prograf, CellCept   Abnormal UA; Urine culture ordered and negative. Will d/c rocephin.  Multiple scab wounds on the knees and ont he right elbow : x rays have been negative for abscess or fractures.   DVT prophylaxis.      Code Status: full code Family Communication: discussed the plan of care with the patient's daughter at bedside Disposition Plan: awaiting SNF placement.    Consultants:  neurology  Procedures:  MRI brain   MRA HEAD and neck  Echocardiogram   Carotid duplex  Antibiotics:  none  HPI/Subjective: Reports pain has improved in the right shoulder. And right elbow.   Objective: Filed Vitals:   06/18/13 0934  BP: 155/85  Pulse: 94  Temp: 99.2 F (37.3 C)  Resp: 20    Intake/Output Summary (Last 24 hours) at 06/18/13 1300 Last data filed at 06/17/13 2200  Gross per 24 hour  Intake    120 ml  Output      0 ml  Net    120 ml   Filed Weights   06/14/13 1700  Weight: 78.3 kg (172 lb 9.9 oz)    Exam:   General:  alert afebrile in mild distress from the pain in the right elbow.   Cardiovascular: s1s2  Respiratory: ctab  Abdomen: soft NT ND BS+  Musculoskeletal: TRACE PEDAL EDEMA. Multiple skin breaks over the right elbow and ont he knees.   Data Reviewed: Basic Metabolic Panel:  Recent Labs Lab 06/14/13 1303 06/15/13 0513 06/16/13 0545  NA 141 142 143  K 3.1* 3.5* 3.7  CL 102 108 111  CO2 23 21 22   GLUCOSE 117* 112* 87  BUN 13 15 13   CREATININE 0.55 0.71 0.58  CALCIUM 9.5 8.5 8.7   Liver Function Tests:  Recent Labs Lab 06/14/13 1303  AST 67*  ALT 25  ALKPHOS 74  BILITOT 1.0  PROT 7.4  ALBUMIN 3.3*    Recent Labs Lab 06/14/13 1303  LIPASE 17   No results found for this basename: AMMONIA,  in the last 168 hours CBC:  Recent Labs Lab 06/14/13 1303 06/15/13 0513 06/18/13 0125  WBC 12.4* 10.5 7.6   NEUTROABS 9.9*  --   --   HGB 13.6 12.0 10.7*  HCT 40.5 36.3 31.1*  MCV 87.9 90.5 89.6  PLT 157 140* 179   Cardiac Enzymes:  Recent Labs Lab 06/14/13 1303 06/15/13 0513 06/16/13 0545  CKTOTAL 2232* 1295* 564*   BNP (last 3 results) No results found for this basename: PROBNP,  in the last 8760 hours CBG:  Recent Labs Lab 06/17/13 1153 06/17/13 1702 06/17/13 2306 06/18/13 0655 06/18/13 1109  GLUCAP 120* 122* 115* 99 149*    Recent Results (from the past 240 hour(s))  URINE CULTURE     Status: None   Collection Time    06/16/13 10:34 AM      Result Value Range Status   Specimen Description URINE, CLEAN CATCH   Final   Special Requests NONE   Final   Culture  Setup Time     Final   Value: 06/16/2013 17:47     Performed at [place]     Final   Value: >=100,000 COLONIES/ML     Performed at [facility]   Culture     Final   Value: PROTEUS MIRABILIS     Performed at [facility]   Report Status PENDING   Incomplete     Studies: Dg Elbow Complete Right  06/17/2013   CLINICAL DATA:  Connie Ruiz.  Posterior right elbow pain.  EXAM: RIGHT ELBOW - COMPLETE 3+ VIEW  COMPARISON:  None.  FINDINGS: The joint spaces are maintained. No acute fracture. No joint effusion. Moderate osteoporosis for age.  IMPRESSION: No acute bony findings or joint effusion.   Electronically Signed   By: Kalman Jewels M.D.   On: 06/17/2013 13:17   Dg Knee Complete 4 Views Right  06/17/2013   CLINICAL DATA:  Connie Ruiz.  Right knee pain.  EXAM: RIGHT KNEE - COMPLETE 4+ VIEW  COMPARISON:  06/14/2013.  FINDINGS: The joint spaces are maintained. No acute fracture. Mild degenerative changes and osteoporosis. Extensive vascular calcifications. Remote healed fibular neck fracture is noted. A very small joint effusion is noted.  IMPRESSION: No acute bony findings.  Very small joint effusion.   Electronically Signed   By: Kalman Jewels M.D.   On: 06/17/2013 13:19     Scheduled Meds: . aspirin  300 mg Rectal Daily   Or  . aspirin  325 mg Oral Daily  . calcitRIOL  0.25 mcg Oral Daily  . darifenacin  15 mg Oral Daily  . diltiazem  180 mg Oral Daily  . folic acid  1 mg Oral Daily  . losartan  50 mg Oral Daily  . mycophenolate  250 mg Oral BID  . predniSONE  1 mg Oral Q breakfast  . sertraline  100 mg Oral Daily  . simvastatin  10 mg Oral Daily  . tacrolimus  0.5 mg  Oral BID  . warfarin  10 mg Oral ONCE-1800  . Warfarin - Pharmacist Dosing Inpatient   Does not apply q1800   Continuous Infusions: . heparin 1,300 Units/hr (06/18/13 1147)    Principal Problem:   Acute encephalopathy Active Problems:   HYPERLIPIDEMIA   HYPERTENSION   Type II or unspecified type diabetes mellitus with renal manifestations, uncontrolled(250.42)   History of stroke with residual effects   Right sided weakness   Rhabdomyolysis    Time spent: 30 min    Daxen Lanum  [facility] Pager [phone]. If 7PM-7AM, please contact night-coverage at [email or web address], password [facility] 06/18/2013, 1:00 PM  LOS: 4 days

## 2013-06-18 NOTE — Progress Notes (Signed)
Patient changed to inpatient r/t requiring IV heparin gtt.

## 2013-06-18 NOTE — Progress Notes (Signed)
ANTICOAGULATION CONSULT NOTE - Follow Up Consult  Pharmacy Consult:  Heparin / Coumadin Indication: APS + history of DVT / PE / CVA  Allergies  Allergen Reactions  . Oxycodone-Acetaminophen Shortness Of Breath and Nausea Only  . Propoxyphene N-Acetaminophen Shortness Of Breath and Nausea Only  . Sulfonamide Derivatives Shortness Of Breath and Nausea Only  . Codeine     REACTION: nausea  . Latex     Patient Measurements: Height: 5' 4.17" (163 cm) Weight: 172 lb 9.9 oz (78.3 kg) IBW/kg (Calculated) : 55.1 Heparin Dosing Weight: 71 kg  Vital Signs: Temp: 99.2 F (37.3 C) (01/12 0934) Temp src: Oral (01/12 0934) BP: 155/85 mmHg (01/12 0934) Pulse Rate: 94 (01/12 0934)  Labs:  Recent Labs  06/16/13 0545 06/17/13 0455 06/18/13 0125 06/18/13 0915  HGB  --   --  10.7*  --   HCT  --   --  31.1*  --   PLT  --   --  179  --   LABPROT 17.2* 17.4* 19.2*  --   INR 1.44 1.47 1.67*  --   HEPARINUNFRC  --   --  0.16* 0.34  CREATININE 0.58  --   --   --   CKTOTAL 564*  --   --   --     Estimated Creatinine Clearance: 83.6 ml/min (by C-G formula based on Cr of 0.58).     Assessment: 70 YOF with history of recurrent CVAs, recurrent DVTs and PEs, and APS to continue on IV heparin and Coumadin.  Heparin level therapeutic but toward the low end of goal.  INR below desired goal but is trending up.  No bleeding reported.    Goal of Therapy:  Heparin level 0.3-0.7 units/ml INR 2 - 3 Monitor platelets by anticoagulation protocol: Yes    Plan:  - Increase heparin gtt to 1300 units/hr - Coumadin 10mg  PO today - Daily PT / INR / HL / CBC - F/U change ASA to 81mg  once INR therapeutic - F/U micro data and the need to resume antibiotic    Connie Ruiz, PharmD, BCPS Pager:  [phone] 06/18/2013, 11:01 AM

## 2013-06-18 NOTE — Consult Note (Signed)
Physical Medicine and Rehabilitation Consult Reason for Consult: Acute encephalopathy with history of CVA Referring Physician: [facility]   HPI: Connie Ruiz is a 53 y.o. right-handed female with history of prior CVA and residual right-sided weakness, antiphospholipid syndrome on chronic Coumadin, renal transplant on immunosuppressants, recurrent DVT pulmonary emboli, diabetes mellitus with peripheral neuropathy. Presented 06/14/2013 after being found by family with altered mental status. MRI of the brain showed no evidence of acute infarct or other acute intracranial abnormalities. EEG negative for seizure. Echocardiogram with ejection fraction of 65% no wall motion abnormalities with grade 1 diastolic dysfunction. Carotid Dopplers with no ICA stenosis. INR on admission of 1.29. Heparin added until INR therapeutic. Maintained on mechanical soft diet. Wound care nurse followup for right elbow wound suspect secondary to fall with wound care as directed. Neurology services followup suspect acute encephalopathy. Physical and occupational therapy ongoing with recommendations of physical medicine rehabilitation consult to consider inpatient rehabilitation services.   Review of Systems  Unable to perform ROS: language   Past Medical History  Diagnosis Date  . THYROID NODULE, LEFT 04/10/2009  . DIABETES MELLITUS, TYPE II 08/21/2007  . HYPERLIPIDEMIA 08/21/2007  . GOUT 08/21/2007  . HYPERTENSION 08/21/2007    Dr. Andree Elk, [place] 08/21/2007  . CVA 04/17/2010  . CEREBROVASCULAR ACCIDENT, ACUTE 04/15/2010  . GERD 08/21/2007  . RENAL INSUFFICIENCY 08/21/2007  . LUPUS 08/21/2007  . OSTEOPOROSIS 08/21/2007    Rheumatol at [facility]  . DVT, HX OF 08/21/2007  . CLOSTRIDIUM DIFFICILE COLITIS, HX OF 08/21/2007  . KIDNEY TRANSPLANTATION, HX OF 08/22/2007    s/p renal transplant-Dr. Andree Elk, [facility]  . Pulmonary embolism 07/16/2010  . Renal failure   . Current use of long term anticoagulation     Dr.  Andree Elk, [facility]  . Depression     Dr. Andree Elk, [facility]  . History of stroke with residual effects   . Right sided weakness    Past Surgical History  Procedure Laterality Date  . Cholecystectomy    . Tubal ligation    . Kidney transplant Right 2009  . Renal biopsy, open  1981   Family History  Problem Relation Age of Onset  . Cancer Neg Hx   . Heart attack Mother   . Heart disease Father    Social History:  reports that she has never smoked. She does not have any smokeless tobacco history on file. She reports that she does not drink alcohol or use illicit drugs. Allergies:  Allergies  Allergen Reactions  . Oxycodone-Acetaminophen Shortness Of Breath and Nausea Only  . Propoxyphene N-Acetaminophen Shortness Of Breath and Nausea Only  . Sulfonamide Derivatives Shortness Of Breath and Nausea Only  . Codeine     REACTION: nausea  . Latex    Medications Prior to Admission  Medication Sig Dispense Refill  . calcitRIOL (ROCALTROL) 0.25 MCG capsule Take 0.25 mcg by mouth daily.      Marland Kitchen diltiazem (TIAZAC) 180 MG 24 hr capsule Take 180 mg by mouth daily.      Marland Kitchen esomeprazole (NEXIUM) 20 MG capsule Take 20 mg by mouth daily at 12 noon.      . folic acid (FOLVITE) 1 MG tablet Take 1 mg by mouth daily.      Marland Kitchen glimepiride (AMARYL) 1 MG tablet Take 1 mg by mouth daily with breakfast.      . glucose blood test strip 1 each by Other route as needed for other. Use as instructed      .  HYDROcodone-acetaminophen (NORCO/VICODIN) 5-325 MG per tablet Take 1 tablet by mouth every 6 (six) hours as needed for moderate pain.      Marland Kitchen losartan (COZAAR) 50 MG tablet Take 50 mg by mouth daily.      . mycophenolate (CELLCEPT) 250 MG capsule Take 250 mg by mouth 2 (two) times daily.      Marland Kitchen nystatin (MYCOSTATIN) powder Apply topically 4 (four) times daily.      . predniSONE (DELTASONE) 1 MG tablet Take 1 mg by mouth daily with breakfast.      . sertraline (ZOLOFT) 100 MG tablet Take 100 mg by mouth daily.      .  simvastatin (ZOCOR) 20 MG tablet Take 20 mg by mouth daily.      . solifenacin (VESICARE) 10 MG tablet Take by mouth daily.      . tacrolimus (PROGRAF) 0.5 MG capsule Take 0.5 mg by mouth 2 (two) times daily.      Marland Kitchen warfarin (COUMADIN) 5 MG tablet Take 5-7.5 mg by mouth daily. Take 7.5 mg on Monday and Friday.  Take 5 mg on Tuesday, Wednesday, Thursday, Saturday, and Sunday.        Home: Home Living Family/patient expects to be discharged to:: Private residence Living Arrangements: Alone Available Help at Discharge: Family;Personal care attendant Type of Home: Apartment Home Access: Level entry [place]: One [place]: [place] - 2 wheels;Cane - single point;Bedside commode;Shower seat  Functional History:   Functional Status:  Mobility:          ADL: ADL Eating/Feeding: Simulated;Supervision/safety;Set up Where Assessed - Eating/Feeding: Bed level Grooming: Wash/dry face;Set up Where Assessed - Grooming: Supine, head of bed up Upper Body Bathing: Simulated;+1 Total assistance Where Assessed - Upper Body Bathing: Supine, head of bed up Lower Body Bathing: Simulated;+1 Total assistance Where Assessed - Lower Body Bathing: Supine, head of bed up Transfers/Ambulation Related to ADLs: Deferred due to no +2 A and patient not wanting to mobilize. ADL Comments: Patient able to use LUE for some ADLs. Very sensitive to touch and movement RUE.   Cognition: Cognition Overall Cognitive Status: Impaired/Different from baseline Arousal/Alertness: Awake/alert Orientation Level: Oriented X4 Attention: Focused;Sustained;Selective Focused Attention: Appears intact Sustained Attention: Appears intact Selective Attention: Impaired Selective Attention Impairment: Verbal complex;Functional complex Memory:  (unable to assess given language deficits) Problem Solving: Impaired Problem Solving Impairment: Functional complex;Verbal complex;Functional basic Behaviors: Physical  agitation Safety/Judgment: Impaired Cognition Arousal/Alertness: Awake/alert Behavior During Therapy: Anxious;Flat affect Overall Cognitive Status: Impaired/Different from baseline Area of Impairment: Orientation;Problem solving Orientation Level: Place;Time;Situation Problem Solving: Slow processing;Decreased initiation;Difficulty sequencing General Comments: pt poor historian, has diffulty expressing home environment and plof  Blood pressure 155/92, pulse 92, temperature 99.4 F (37.4 C), temperature source Oral, resp. rate 20, height 5' 4.17" (1.63 m), weight 78.3 kg (172 lb 9.9 oz), SpO2 95.00%. Physical Exam  Vitals reviewed. HENT:  Head: Normocephalic.  Eyes: EOM are normal.  Neck: Normal range of motion. Neck supple. No thyromegaly present.  Cardiovascular: Normal rate and regular rhythm.   Respiratory: Effort normal and breath sounds normal. No respiratory distress.  GI: Soft. Bowel sounds are normal. She exhibits no distension.  Neurological: She is alert.  Patient with receptive and expressive aphasia. She was able to provide place and at age with cues and prompting. She was inconsistent to follow simple commands. A little labile with mood. RUE grossly 1+ to 2/5. RLE was similar, perhaps a little weaker. May have some resting tone, but pt appeared to be  volitionally resisting ROM. Right side tender to palpation but better when distracted.   Skin: Skin is warm and dry.  Psychiatric:  Confused, agitated    Results for orders placed during the hospital encounter of 06/14/13 (from the past 24 hour(s))  GLUCOSE, CAPILLARY     Status: Abnormal   Collection Time    06/17/13  7:00 AM      Result Value Range   Glucose-Capillary 114 (*) 70 - 99 mg/dL   Comment 1 Documented in Chart     Comment 2 Notify RN    GLUCOSE, CAPILLARY     Status: Abnormal   Collection Time    06/17/13 11:53 AM      Result Value Range   Glucose-Capillary 120 (*) 70 - 99 mg/dL  GLUCOSE, CAPILLARY      Status: Abnormal   Collection Time    06/17/13  5:02 PM      Result Value Range   Glucose-Capillary 122 (*) 70 - 99 mg/dL  GLUCOSE, CAPILLARY     Status: Abnormal   Collection Time    06/17/13 11:06 PM      Result Value Range   Glucose-Capillary 115 (*) 70 - 99 mg/dL   Comment 1 Documented in Chart     Comment 2 Notify RN    HEPARIN LEVEL (UNFRACTIONATED)     Status: Abnormal   Collection Time    06/18/13  1:25 AM      Result Value Range   Heparin Unfractionated 0.16 (*) 0.30 - 0.70 IU/mL  CBC     Status: Abnormal   Collection Time    06/18/13  1:25 AM      Result Value Range   WBC 7.6  4.0 - 10.5 K/uL   RBC 3.47 (*) 3.87 - 5.11 MIL/uL   Hemoglobin 10.7 (*) 12.0 - 15.0 g/dL   HCT 31.1 (*) 36.0 - 46.0 %   MCV 89.6  78.0 - 100.0 fL   MCH 30.8  26.0 - 34.0 pg   MCHC 34.4  30.0 - 36.0 g/dL   RDW 13.8  11.5 - 15.5 %   Platelets 179  150 - 400 K/uL  PROTIME-INR     Status: Abnormal   Collection Time    06/18/13  1:25 AM      Result Value Range   Prothrombin Time 19.2 (*) 11.6 - 15.2 seconds   INR 1.67 (*) 0.00 - 1.49   Dg Elbow Complete Right  06/17/2013   CLINICAL DATA:  [place].  Posterior right elbow pain.  EXAM: RIGHT ELBOW - COMPLETE 3+ VIEW  COMPARISON:  None.  FINDINGS: The joint spaces are maintained. No acute fracture. No joint effusion. Moderate osteoporosis for age.  IMPRESSION: No acute bony findings or joint effusion.   Electronically Signed   By: Kalman Jewels M.D.   On: 06/17/2013 13:17   Dg Knee Complete 4 Views Right  06/17/2013   CLINICAL DATA:  [place].  Right knee pain.  EXAM: RIGHT KNEE - COMPLETE 4+ VIEW  COMPARISON:  06/14/2013.  FINDINGS: The joint spaces are maintained. No acute fracture. Mild degenerative changes and osteoporosis. Extensive vascular calcifications. Remote healed fibular neck fracture is noted. A very small joint effusion is noted.  IMPRESSION: No acute bony findings.  Very small joint effusion.   Electronically Signed   By: Kalman Jewels  M.D.   On: 06/17/2013 13:19    Assessment/Plan: Diagnosis: Rhabdomyolysis, encephalopahty, prior left CVA 1. Does the need for close, 24 hr/day medical  supervision in concert with the patient's rehab needs make it unreasonable for this patient to be served in a less intensive setting? Yes 2. Co-Morbidities requiring supervision/potential complications: htn, dm 3. Due to bladder management, bowel management, safety, skin/wound care, disease management, medication administration, pain management and patient education, does the patient require 24 hr/day rehab nursing? Yes 4. Does the patient require coordinated care of a physician, rehab nurse, PT (1-2 hrs/day, 5 days/week), OT (1-2 hrs/day, 5 days/week) and SLP (1-2 hrs/day, 5 days/week) to address physical and functional deficits in the context of the above medical diagnosis(es)? Yes Addressing deficits in the following areas: balance, endurance, locomotion, strength, transferring, bowel/bladder control, bathing, dressing, feeding, grooming, toileting, cognition and psychosocial support 5. Can the patient actively participate in an intensive therapy program of at least 3 hrs of therapy per day at least 5 days per week? Yes 6. The potential for patient to make measurable gains while on inpatient rehab is excellent 7. Anticipated functional outcomes upon discharge from inpatient rehab are min to mod assist with PT, min to mod assist with OT, supervision to min assist with SLP. 8. Estimated rehab length of stay to reach the above functional goals is: 18-22 days 9. Does the patient have adequate social supports to accommodate these discharge functional goals? Yes 10. Anticipated D/C setting: Home 11. Anticipated post D/C treatments: [place] therapy 12. Overall Rehab/Functional Prognosis: excellent  RECOMMENDATIONS: This patient's condition is appropriate for continued rehabilitative care in the following setting: CIR Patient has agreed to participate in  recommended program. Potentially Note that insurance prior authorization may be required for reimbursement for recommended care.  Comment: Need to follow up on social supports. Rehab Admissions Coordinator to follow up.  Thanks,  Meredith Staggers, MD, Mellody Drown     06/18/2013

## 2013-06-18 NOTE — Progress Notes (Signed)
ANTICOAGULATION CONSULT NOTE - Follow Up Consult  Pharmacy Consult for Heparin  Indication: Anti-phospholipid syndrome, hx stroke, DVT, PE  Allergies  Allergen Reactions  . Oxycodone-Acetaminophen Shortness Of Breath and Nausea Only  . Propoxyphene N-Acetaminophen Shortness Of Breath and Nausea Only  . Sulfonamide Derivatives Shortness Of Breath and Nausea Only  . Codeine     REACTION: nausea  . Latex     Patient Measurements: Height: 5' 4.17" (163 cm) Weight: 172 lb 9.9 oz (78.3 kg) IBW/kg (Calculated) : 55.1  Vital Signs: Temp: 98.7 F (37.1 C) (01/12 0124) Temp src: Oral (01/12 0124) BP: 155/82 mmHg (01/12 0124) Pulse Rate: 90 (01/12 0124)  Labs:  Recent Labs  06/15/13 0513 06/16/13 0545 06/17/13 0455 06/18/13 0125  HGB 12.0  --   --  10.7*  HCT 36.3  --   --  31.1*  PLT 140*  --   --  179  LABPROT 15.8* 17.2* 17.4* 19.2*  INR 1.29 1.44 1.47 1.67*  HEPARINUNFRC  --   --   --  0.16*  CREATININE 0.71 0.58  --   --   CKTOTAL 1295* 564*  --   --     Estimated Creatinine Clearance: 83.6 ml/min (by C-G formula based on Cr of 0.58).   Medications:  Heparin 1050 units/hr  Assessment: 53 y/o F with hx APS with h/o stroke, DVT, PE on warfarin with heparin bridging while INR is sub-therapeutic. HL is 0.16. Other labs as above.   Goal of Therapy:  Heparin level 0.3-0.7 units/ml Monitor platelets by anticoagulation protocol: Yes   Plan:  -Increase heparin drip to 1250 units/hr -6 hour HL at 1000 -Daily CBC/HL -Warfarin per previous note -Monitor for bleeding  Narda Bonds 06/18/2013,2:52 AM

## 2013-06-19 DIAGNOSIS — I82409 Acute embolism and thrombosis of unspecified deep veins of unspecified lower extremity: Secondary | ICD-10-CM

## 2013-06-19 LAB — CBC
HCT: 35.6 % — ABNORMAL LOW (ref 36.0–46.0)
Hemoglobin: 11.8 g/dL — ABNORMAL LOW (ref 12.0–15.0)
MCH: 29.9 pg (ref 26.0–34.0)
MCHC: 33.1 g/dL (ref 30.0–36.0)
MCV: 90.4 fL (ref 78.0–100.0)
Platelets: 214 10*3/uL (ref 150–400)
RBC: 3.94 MIL/uL (ref 3.87–5.11)
RDW: 13.5 % (ref 11.5–15.5)
WBC: 8.5 10*3/uL (ref 4.0–10.5)

## 2013-06-19 LAB — GLUCOSE, CAPILLARY
Glucose-Capillary: 98 mg/dL (ref 70–99)
Glucose-Capillary: 98 mg/dL (ref 70–99)
Glucose-Capillary: 115 mg/dL — ABNORMAL HIGH (ref 70–99)
Glucose-Capillary: 116 mg/dL — ABNORMAL HIGH (ref 70–99)
Glucose-Capillary: 152 mg/dL — ABNORMAL HIGH (ref 70–99)

## 2013-06-19 LAB — HEPARIN LEVEL (UNFRACTIONATED): Heparin Unfractionated: 0.42 IU/mL (ref 0.30–0.70)

## 2013-06-19 LAB — PROTIME-INR
INR: 2.21 — ABNORMAL HIGH (ref 0.00–1.49)
Prothrombin Time: 23.8 seconds — ABNORMAL HIGH (ref 11.6–15.2)

## 2013-06-19 MED ORDER — ASPIRIN EC 81 MG PO TBEC: 81.0000 mg | DELAYED_RELEASE_TABLET | Freq: Every day | ORAL | Status: DC

## 2013-06-19 MED ORDER — CIPROFLOXACIN HCL 500 MG PO TABS
500.0000 mg | ORAL_TABLET | Freq: Two times a day (BID) | ORAL | Status: DC
  Administered 2013-06-19 – 2013-06-20 (×3): 500 mg via ORAL
  Filled 2013-06-19 (×5): qty 1

## 2013-06-19 MED ORDER — SENNOSIDES-DOCUSATE SODIUM 8.6-50 MG PO TABS: 1.0000 | ORAL_TABLET | Freq: Every evening | ORAL | Status: DC | PRN

## 2013-06-19 MED ORDER — ASPIRIN EC 81 MG PO TBEC
81.0000 mg | DELAYED_RELEASE_TABLET | Freq: Every day | ORAL | Status: DC
  Filled 2013-06-19: qty 1

## 2013-06-19 MED ORDER — WARFARIN SODIUM 5 MG PO TABS
5.0000 mg | ORAL_TABLET | Freq: Once | ORAL | Status: AC
  Administered 2013-06-19: 5 mg via ORAL
  Filled 2013-06-19: qty 1

## 2013-06-19 MED ORDER — CIPROFLOXACIN HCL 500 MG PO TABS: 500.0000 mg | ORAL_TABLET | Freq: Two times a day (BID) | ORAL | Status: DC

## 2013-06-19 NOTE — Progress Notes (Signed)
PT Cancellation Note  Patient Details Name: Connie Ruiz MRN: [id number] DOB: March 03, 1961   Cancelled Treatment:    Reason Eval/Treat Not Completed: Pain limiting ability to participate. Attempted to encourage patient just to try to sit EOB however patient continued to say no. Will follow up later as time allows   Jeff Mccallum, Tonia Brooms 06/19/2013, 11:11 AM

## 2013-06-19 NOTE — Progress Notes (Signed)
Speech Language Pathology Treatment: Dysphagia;Cognitive-Linquistic  Patient Details Name: Connie Ruiz MRN: [id number] DOB: 23-Dec-1960 Today's Date: 06/19/2013 Time: [id number] SLP Time Calculation (min): 26 min  Assessment / Plan / Recommendation Clinical Impression  Pt seen for f/u dysphagia and aphasia treatment. Pt consumed trials of Dys 3 textures and thin liquids via straw sips with cough x1 following bite of cracker. Pt named common objects with 80% accuracy with Min cues. Spontaneous verbal expression during functional communication and familiar self-care tasks was at the phrase level with Min-Mod encouragement ("go to bathroom," "I see nothing," etc.). Pt appears to have minimal improvement with expressive language from previous visit. Continue plan of care.   HPI HPI: Patient is a 53 year old female with history of prior stroke with right-sided residual weakness, history of antiphospholipid syndrome on Coumadin, renal transplant on immunosuppressants, recurrent DVT, PE, diabetes was brought to the ER by her family after found in the bathtub today, where she was lying whole night. History was obtained from the patient's daughter who reported that she was last seen normal around noon yesterday. The daughter called the patient who lives alone several times this morning on phone however she did not pick up. Subsequently she and her home health aide found her lying in the bathtub. Been trying to obtain history from the patient she states that she went should the bathtub to take that around 3 PM but she was too weak to get up. Per daughter there was no water in the bathtub. Spoke w pt's daughter who reports that speech has worsened significantly with this admission; pt did have chronic aphasia from previous strokes as well. Pt had been pocketing foods yesterday afternoon and was then placed NPO.    Pertinent Vitals N/A  SLP Plan  Continue with current plan of care    Recommendations Diet  recommendations: Dysphagia 3 (mechanical soft);Thin liquid Liquids provided via: Cup;Straw;No straw Medication Administration: Whole meds with liquid Supervision: Full supervision/cueing for compensatory strategies Compensations: Slow rate;Check for pocketing;Small sips/bites Postural Changes and/or Swallow Maneuvers: Seated upright 90 degrees              General recommendations: Rehab consult Oral Care Recommendations: Oral care BID Follow up Recommendations: Inpatient Rehab Plan: Continue with current plan of care    GO      Connie Ruiz, M.A. CCC-SLP [phone]  Connie Ruiz 06/19/2013, 2:30 PM

## 2013-06-19 NOTE — Plan of Care (Signed)
Pt refused dressing to be changed on right elbow.

## 2013-06-19 NOTE — Progress Notes (Signed)
CSW spoke with the Pt's daughter Lelia Lampinen [phone])who is agreeable to Pt going to CIR if that is still an option.   CSW also requested that daughter speak with [facility] and have them take a look at Pt information to see if she is a candidate for their facility.   CSW working with Pt's family for d/c planning.     [facility]  [address] 1[address];  [phone] Phone: [phone]

## 2013-06-19 NOTE — Progress Notes (Signed)
I met with pt at bedside and then contacted her daughter by phone. 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 I have contacted RN CM and SW. We will sign off. [phone]

## 2013-06-19 NOTE — Discharge Summary (Signed)
Physician Discharge Summary  Connie Ruiz [id number] DOB: 19-Sep-1960 DOA: 06/14/2013  PCP: Renato Shin, MD  Admit date: 06/14/2013 Discharge date: 06/19/2013  Time spent: 30 minutes  Recommendations for Outpatient Follow-up:  1. Follow up with PCP in one week.  2. Follow up with Neurology in 2 weeks.   Discharge Diagnoses:  Principal Problem:   Acute encephalopathy Active Problems:   HYPERLIPIDEMIA   HYPERTENSION   Type II or unspecified type diabetes mellitus with renal manifestations, uncontrolled   History of stroke with residual effects   Right sided weakness   Rhabdomyolysis   Discharge Condition: improved  Diet recommendation: low sodium and carb modified diet.   Filed Weights   06/14/13 1700  Weight: 78.3 kg (172 lb 9.9 oz)    History of present illness:  Patient is a 53 year old female with history of prior stroke with right-sided residual weakness, history of antiphospholipid syndrome on Coumadin, renal transplant on immunosuppressants, recurrent DVT, PE, diabetes was brought to the ER by her family after found in the bathtub today, where she was lying whole night. History was obtained from the patient's daughter who reported that she was last seen normal around noon the day before admission. The daughter called the patient who lives alone several times this morning on phone however she did not pick up. Subsequently she and her home health aide found her lying in the bathtub. Been trying to obtain history from the patient she states that she went should the bathtub to take that around 3 PM but she was too weak to get up. She was brought to ED and was worked up for CVA. All her work up came back negative. PT eval recommended SNF. Social worker facilitating the d/c.    Hospital Course:   Acute encephalopathy with a worsened right-sided weakness, history of prior multiple strokes  MRI does not show any acute infarct . Encephalomalacia related to left MCA infarct.   Neurology has been consulted.  EEG does not show epileptiform activity.  Echocardiogram Reveals the estimated ejection fraction was in the range of 60% to 65%. Wall motion was normal; there were no regional wall motion abnormalities. Doppler parameters are consistent with abnormal left ventricular relaxation (grade 1 diastolic dysfunction).  PT evaluated and recommended CIR, but daughter wanted to take her to [place]. We will get social worker to facilitate that.  She is resumed on her coumadin, but as her INR is sub therapeutic , we have started her on IV heparin to bridge it.  Her INR is therapeutic today and we have discontinued the heparin.  Further recommendations as per neurology as outpatient.   HYPERLIPIDEMIA  - continue Zocor   HYPERTENSION  - Continue Cardizem   Type II or unspecified type diabetes mellitus with renal manifestations, uncontrolled(250.42)  - Continue sliding scale insulin,.   CBG (last 3)   Recent Labs   06/17/13 2306  06/18/13 0655  06/18/13 1109   GLUCAP  115*  99  149*    Rhabdomyolysis  - Placed on IV fluid hydration , ck level improved.   Hypokalemia: Placed on potassium replacement.  History of renal transplant  - Continue prednisone, Prograf, CellCept   Proteus UTI: Discharging pt on ciprofloxacin.   Multiple scab wounds on the knees and ont he right elbow : x rays have been negative for abscess or fractures.      Procedures: MRI brain MRA HEAD and neck  Echocardiogram  Carotid duplex   Consultations:  NEUROLOGY  Discharge Exam:  Filed Vitals:   06/19/13 0940  BP: 146/90  Pulse: 102  Temp: 98 F (36.7 C)  Resp:    General: alert afebrile in mild distress from the pain in the right elbow. Cardiovascular: s1s2  Respiratory: ctab  Abdomen: soft NT ND BS+  Musculoskeletal: TRACE PEDAL EDEMA. Multiple skin breaks over the right elbow and ont he knees.    Discharge Instructions      Discharge Orders   Future  Appointments Provider Department Dept Phone   07/16/2013 1:45 PM Renato Shin, MD [facility] End[facility]ology [phone]   Future Orders Complete By Expires   Diet - low sodium heart healthy  As directed    Discharge instructions  As directed    Comments:     Follow up with PCP in 1 to 2 weeks.  Follow up withNEUROLOGY AS RECOMMENDED.       Medication List    STOP taking these medications       glimepiride 1 MG tablet  Commonly known as:  AMARYL     glucose blood test strip      TAKE these medications       calcitRIOL 0.25 MCG capsule  Commonly known as:  ROCALTROL  Take 0.25 mcg by mouth daily.     ciprofloxacin 500 MG tablet  Commonly known as:  CIPRO  Take 1 tablet (500 mg total) by mouth 2 (two) times daily.     diltiazem 180 MG 24 hr capsule  Commonly known as:  TIAZAC  Take 180 mg by mouth daily.     esomeprazole 20 MG capsule  Commonly known as:  NEXIUM  Take 20 mg by mouth daily at 12 noon.     folic acid 1 MG tablet  Commonly known as:  FOLVITE  Take 1 mg by mouth daily.     HYDROcodone-acetaminophen 5-325 MG per tablet  Commonly known as:  NORCO/VICODIN  Take 1 tablet by mouth every 6 (six) hours as needed for moderate pain.     losartan 50 MG tablet  Commonly known as:  COZAAR  Take 50 mg by mouth daily.     mycophenolate 250 MG capsule  Commonly known as:  CELLCEPT  Take 250 mg by mouth 2 (two) times daily.     nystatin powder  Commonly known as:  MYCOSTATIN  Apply topically 4 (four) times daily.     predniSONE 1 MG tablet  Commonly known as:  DELTASONE  Take 1 mg by mouth daily with breakfast.     senna-docusate 8.6-50 MG per tablet  Commonly known as:  Senokot-S  Take 1 tablet by mouth at bedtime as needed for mild constipation.     sertraline 100 MG tablet  Commonly known as:  ZOLOFT  Take 100 mg by mouth daily.     simvastatin 20 MG tablet  Commonly known as:  ZOCOR  Take 20 mg by mouth daily.     solifenacin 10 MG  tablet  Commonly known as:  VESICARE  Take by mouth daily.     tacrolimus 0.5 MG capsule  Commonly known as:  PROGRAF  Take 0.5 mg by mouth 2 (two) times daily.     warfarin 5 MG tablet  Commonly known as:  COUMADIN  Take 5-7.5 mg by mouth daily. Take 7.5 mg on Monday and Friday.  Take 5 mg on Tuesday, Wednesday, Thursday, Saturday, and Sunday.       Allergies  Allergen Reactions  . Oxycodone-Acetaminophen Shortness Of Breath and Nausea  Only  . Propoxyphene N-Acetaminophen Shortness Of Breath and Nausea Only  . Sulfonamide Derivatives Shortness Of Breath and Nausea Only  . Codeine     REACTION: nausea  . Latex    Follow-up Information   Follow up with Renato Shin, MD. Schedule an appointment as soon as possible for a visit in 2 weeks.   Specialty:  Endocrinology   Contact information:   [address] [place] [address] [phone]        The results of significant diagnostics from this hospitalization (including imaging, microbiology, ancillary and laboratory) are listed below for reference.    Significant Diagnostic Studies: Dg Thoracic Spine 2 View  06/14/2013   CLINICAL DATA:  Fall and back pain.  EXAM: THORACIC SPINE - 2 VIEW  COMPARISON:  Chest radiograph 10/27/2012  FINDINGS: The bone detail is limited on the lateral view and swimmer's view. The alignment of the thoracic spine and cervical-thoracic spine appear to be within normal limits. The vertebral body heights are grossly intact. Limited evaluation of the thoracolumbar junction on the lateral view.  IMPRESSION: The bone detail is limited on this examination but the alignment is grossly normal.   Electronically Signed   By: Markus Daft M.D.   On: 06/14/2013 14:23   Dg Shoulder Right  06/14/2013   CLINICAL DATA:  Fall and right shoulder pain.  EXAM: RIGHT SHOULDER - 2+ VIEW  COMPARISON:  None.  FINDINGS: Two views of the right shoulder were obtained. The scapular Y-view is limited but there no  gross dislocation. No evidence for an acute fracture. AC joint is grossly intact.  IMPRESSION: Limited examination without gross abnormality.   Electronically Signed   By: Markus Daft M.D.   On: 06/14/2013 14:25   Dg Elbow Complete Right  06/17/2013   CLINICAL DATA:  [place].  Posterior right elbow pain.  EXAM: RIGHT ELBOW - COMPLETE 3+ VIEW  COMPARISON:  None.  FINDINGS: The joint spaces are maintained. No acute fracture. No joint effusion. Moderate osteoporosis for age.  IMPRESSION: No acute bony findings or joint effusion.   Electronically Signed   By: Kalman Jewels M.D.   On: 06/17/2013 13:17   Dg Hip Complete Right  06/14/2013   CLINICAL DATA:  Fall and right hip pain.  EXAM: RIGHT HIP - COMPLETE 2+ VIEW  COMPARISON:  CT 08/25/2011  FINDINGS: Again noted are multiple soft tissue calcifications in the lower abdomen. The pelvic bony ring is intact. Right hip is located without acute fracture.  IMPRESSION: No acute bone abnormality to the pelvis or right hip.   Electronically Signed   By: Markus Daft M.D.   On: 06/14/2013 14:26   Ct Head Wo Contrast  06/14/2013   CLINICAL DATA:  Fall in bathtub last night, found still in tub this morning. Trauma and neck pain.  EXAM: CT HEAD WITHOUT CONTRAST  CT MAXILLOFACIAL WITHOUT CONTRAST  CT CERVICAL SPINE WITHOUT CONTRAST  TECHNIQUE: Multidetector CT imaging of the head, cervical spine, and maxillofacial structures were performed using the standard protocol without intravenous contrast. Multiplanar CT image reconstructions of the cervical spine and maxillofacial structures were also generated.  COMPARISON:  03/23/2012  FINDINGS: CT HEAD FINDINGS  Small remote lacunar infarcts in both cerebellar hemispheres. These are similar to prior. Brainstem, thalami, right basal ganglia, and basilar cisterns unremarkable.  Remote infarct of the left basal ganglia with punctate dystrophic internal calcification along the infarct margin. Remote left frontoparietal infarct, similar  appearance to prior, with  considerable thinning of the left frontal periventricular white matter.  Atherosclerotic calcification of the carotid siphons. No intracranial hemorrhage, mass lesion, or acute CVA.  CT MAXILLOFACIAL FINDINGS  There is degenerative spurring of both mandibular condyles. No facial fracture observed. No acute intra orbital findings. Dental cavity of tooth 29. Scattered tooth decay.  CT CERVICAL SPINE FINDINGS  Suspected small bone islands in the spinous process of C5 and potentially along the posterior superior endplate of T1. This could reflect incidental osteopoikilosis.  No cervical spine fracture or subluxation is observed. No vertebral subluxation is observed. No prevertebral soft tissue swelling. Bone island noted in the right 2nd rib posteriorly on the axial images.  Prominent thyroid gland with indistinct nodularity noted. The patient has had prior thyroid ultrasound workups.  There is low-grade stranding in the right supraclavicular region, of uncertain significance and etiology, as shown on axial images 59-74 of series 10.  IMPRESSION: 1. No acute intracranial findings. Remote cerebellar and left basal ganglia infarcts is. Remote left frontoparietal infarct. 2. Low grade stranding/edema in the right supraclavicular region, without cervical spine abnormality or other specific cause for this low grade stranding observed. 3. Scattered tooht decay, particularly of tooth 20. 9. No facial fracture. No cervical spine fracture or cervical spine subluxation.   Electronically Signed   By: Sherryl Barters M.D.   On: 06/14/2013 15:27   Ct Cervical Spine Wo Contrast  06/14/2013   CLINICAL DATA:  Fall in bathtub last night, found still in tub this morning. Trauma and neck pain.  EXAM: CT HEAD WITHOUT CONTRAST  CT MAXILLOFACIAL WITHOUT CONTRAST  CT CERVICAL SPINE WITHOUT CONTRAST  TECHNIQUE: Multidetector CT imaging of the head, cervical spine, and maxillofacial structures were performed using  the standard protocol without intravenous contrast. Multiplanar CT image reconstructions of the cervical spine and maxillofacial structures were also generated.  COMPARISON:  03/23/2012  FINDINGS: CT HEAD FINDINGS  Small remote lacunar infarcts in both cerebellar hemispheres. These are similar to prior. Brainstem, thalami, right basal ganglia, and basilar cisterns unremarkable.  Remote infarct of the left basal ganglia with punctate dystrophic internal calcification along the infarct margin. Remote left frontoparietal infarct, similar appearance to prior, with considerable thinning of the left frontal periventricular white matter.  Atherosclerotic calcification of the carotid siphons. No intracranial hemorrhage, mass lesion, or acute CVA.  CT MAXILLOFACIAL FINDINGS  There is degenerative spurring of both mandibular condyles. No facial fracture observed. No acute intra orbital findings. Dental cavity of tooth 29. Scattered tooth decay.  CT CERVICAL SPINE FINDINGS  Suspected small bone islands in the spinous process of C5 and potentially along the posterior superior endplate of T1. This could reflect incidental osteopoikilosis.  No cervical spine fracture or subluxation is observed. No vertebral subluxation is observed. No prevertebral soft tissue swelling. Bone island noted in the right 2nd rib posteriorly on the axial images.  Prominent thyroid gland with indistinct nodularity noted. The patient has had prior thyroid ultrasound workups.  There is low-grade stranding in the right supraclavicular region, of uncertain significance and etiology, as shown on axial images 59-74 of series 10.  IMPRESSION: 1. No acute intracranial findings. Remote cerebellar and left basal ganglia infarcts is. Remote left frontoparietal infarct. 2. Low grade stranding/edema in the right supraclavicular region, without cervical spine abnormality or other specific cause for this low grade stranding observed. 3. Scattered tooht decay,  particularly of tooth 20. 9. No facial fracture. No cervical spine fracture or cervical spine subluxation.   Electronically Signed  By: Sherryl Barters M.D.   On: 06/14/2013 15:27   Mr Brain Wo Contrast  06/14/2013   CLINICAL DATA:  History of prior stroke, antiphospholipid syndrome on Coumadin, renal transplant on immunosuppressants, and recurrent DVT, PE, and diabetes. Worsened left-sided facial droop and worsened right arm and leg weakness with dysarthria.  EXAM: MRI HEAD WITHOUT CONTRAST  MRA HEAD WITHOUT CONTRAST  TECHNIQUE: Multiplanar, multiecho pulse sequences of the brain and surrounding structures were obtained without intravenous contrast. Angiographic images of the head were obtained using MRA technique without contrast.  COMPARISON:  Head CT 06/14/2013 and brain MRI 03/23/2012. Head MRA 03/15/2010.  FINDINGS: MRI HEAD FINDINGS  There is no evidence of acute infarct. Encephalomalacia is again seen related to remote left MCA infarct involving the left frontal and parietal lobes, opercular region, and basal ganglia with associated ex vacuo dilatation of the left lateral ventricle and susceptibility artifact compatible with associated prior hemorrhage. Remote bilateral cerebellar infarcts are again seen as well. There is no evidence of mass, midline shift, or extra-axial fluid collection. Orbits and visualized paranasal sinuses and mastoid air cells are unremarkable.  MRA HEAD FINDINGS  Visualized distal vertebral arteries are patent. PICA origins are patent. Basilar artery is patent without stenosis. SCAs are patent. PCAs are patent with mild to moderate distal branch vessel irregularity, right greater than left, stable to slightly increased from the prior on the left.  Internal carotid arteries are patent from skullbase carotid terminus. There is mild to moderate irregularity and narrowing of the supra clinoid ICAs bilaterally, mildly increased from prior. ACA and MCA origins are patent. Visualized  right MCA branches are patent and demonstrate mild to moderate irregularity, slightly increased from prior. The left M1 segment hasrecanalized, as has 1 of the M2 branches. The other main M2 trunk remains occluded. There is moderate left MCA branch vessel irregularity. There is moderate irregularity of the right A1 segment, which has increased, particularly distally. Left A2 irregularity is similar to the prior exam. No intracranial aneurysm or new major intracranial arterial occlusion is identified.  IMPRESSION: 1. No evidence of acute infarct or other acute intracranial abnormality. 2. Similar appearance of encephalomalacia related to left MCA infarct as well as bilateral cerebellar infarcts. 3. Interval partial recanalization of the left MCA as above. 4. Overall mild interval progression of atherosclerotic type changes of the anterior and posterior circulation as above.   Electronically Signed   By: Logan Bores   On: 06/14/2013 21:03   Dg Knee Complete 4 Views Right  06/17/2013   CLINICAL DATA:  [place].  Right knee pain.  EXAM: RIGHT KNEE - COMPLETE 4+ VIEW  COMPARISON:  06/14/2013.  FINDINGS: The joint spaces are maintained. No acute fracture. Mild degenerative changes and osteoporosis. Extensive vascular calcifications. Remote healed fibular neck fracture is noted. A very small joint effusion is noted.  IMPRESSION: No acute bony findings.  Very small joint effusion.   Electronically Signed   By: Kalman Jewels M.D.   On: 06/17/2013 13:19   Dg Knee Complete 4 Views Right  06/14/2013   CLINICAL DATA:  Right anterior knee pain status post fall with visible soft tissue swelling anteriorly.  EXAM: RIGHT KNEE - COMPLETE 4+ VIEW  COMPARISON:  None.  FINDINGS: The bones of the right knee are osteopenic diffusely. There is abnormal appearance of the proximal fibular metadiaphysis. This may reflect an acute or old fracture. The patella appears intact and normally positioned. A small amount of soft tissue swelling  anteriorly is  suspected. There are vascular calcifications.  IMPRESSION: The distal femur, the patella, and the proximal tibia appear intact. There is no abnormal appearance of the meta diaphysis of the adjacent fibula. This may reflect an acute or healing fracture. There is diffuse osteopenia. Correlation with the patient's clinical examination is needed. CT scanning is available upon request.   Electronically Signed   By: David  [name]   On: 06/14/2013 14:23   Mr Jodene Nam Head/brain Wo Cm  06/14/2013   CLINICAL DATA:  History of prior stroke, antiphospholipid syndrome on Coumadin, renal transplant on immunosuppressants, and recurrent DVT, PE, and diabetes. Worsened left-sided facial droop and worsened right arm and leg weakness with dysarthria.  EXAM: MRI HEAD WITHOUT CONTRAST  MRA HEAD WITHOUT CONTRAST  TECHNIQUE: Multiplanar, multiecho pulse sequences of the brain and surrounding structures were obtained without intravenous contrast. Angiographic images of the head were obtained using MRA technique without contrast.  COMPARISON:  Head CT 06/14/2013 and brain MRI 03/23/2012. Head MRA 03/15/2010.  FINDINGS: MRI HEAD FINDINGS  There is no evidence of acute infarct. Encephalomalacia is again seen related to remote left MCA infarct involving the left frontal and parietal lobes, opercular region, and basal ganglia with associated ex vacuo dilatation of the left lateral ventricle and susceptibility artifact compatible with associated prior hemorrhage. Remote bilateral cerebellar infarcts are again seen as well. There is no evidence of mass, midline shift, or extra-axial fluid collection. Orbits and visualized paranasal sinuses and mastoid air cells are unremarkable.  MRA HEAD FINDINGS  Visualized distal vertebral arteries are patent. PICA origins are patent. Basilar artery is patent without stenosis. SCAs are patent. PCAs are patent with mild to moderate distal branch vessel irregularity, right greater than left, stable  to slightly increased from the prior on the left.  Internal carotid arteries are patent from skullbase carotid terminus. There is mild to moderate irregularity and narrowing of the supra clinoid ICAs bilaterally, mildly increased from prior. ACA and MCA origins are patent. Visualized right MCA branches are patent and demonstrate mild to moderate irregularity, slightly increased from prior. The left M1 segment hasrecanalized, as has 1 of the M2 branches. The other main M2 trunk remains occluded. There is moderate left MCA branch vessel irregularity. There is moderate irregularity of the right A1 segment, which has increased, particularly distally. Left A2 irregularity is similar to the prior exam. No intracranial aneurysm or new major intracranial arterial occlusion is identified.  IMPRESSION: 1. No evidence of acute infarct or other acute intracranial abnormality. 2. Similar appearance of encephalomalacia related to left MCA infarct as well as bilateral cerebellar infarcts. 3. Interval partial recanalization of the left MCA as above. 4. Overall mild interval progression of atherosclerotic type changes of the anterior and posterior circulation as above.   Electronically Signed   By: Logan Bores   On: 06/14/2013 21:03   Ct Maxillofacial Wo Cm  06/14/2013   CLINICAL DATA:  Fall in bathtub last night, found still in tub this morning. Trauma and neck pain.  EXAM: CT HEAD WITHOUT CONTRAST  CT MAXILLOFACIAL WITHOUT CONTRAST  CT CERVICAL SPINE WITHOUT CONTRAST  TECHNIQUE: Multidetector CT imaging of the head, cervical spine, and maxillofacial structures were performed using the standard protocol without intravenous contrast. Multiplanar CT image reconstructions of the cervical spine and maxillofacial structures were also generated.  COMPARISON:  03/23/2012  FINDINGS: CT HEAD FINDINGS  Small remote lacunar infarcts in both cerebellar hemispheres. These are similar to prior. Brainstem, thalami, right basal ganglia, and  basilar cisterns  unremarkable.  Remote infarct of the left basal ganglia with punctate dystrophic internal calcification along the infarct margin. Remote left frontoparietal infarct, similar appearance to prior, with considerable thinning of the left frontal periventricular white matter.  Atherosclerotic calcification of the carotid siphons. No intracranial hemorrhage, mass lesion, or acute CVA.  CT MAXILLOFACIAL FINDINGS  There is degenerative spurring of both mandibular condyles. No facial fracture observed. No acute intra orbital findings. Dental cavity of tooth 29. Scattered tooth decay.  CT CERVICAL SPINE FINDINGS  Suspected small bone islands in the spinous process of C5 and potentially along the posterior superior endplate of T1. This could reflect incidental osteopoikilosis.  No cervical spine fracture or subluxation is observed. No vertebral subluxation is observed. No prevertebral soft tissue swelling. Bone island noted in the right 2nd rib posteriorly on the axial images.  Prominent thyroid gland with indistinct nodularity noted. The patient has had prior thyroid ultrasound workups.  There is low-grade stranding in the right supraclavicular region, of uncertain significance and etiology, as shown on axial images 59-74 of series 10.  IMPRESSION: 1. No acute intracranial findings. Remote cerebellar and left basal ganglia infarcts is. Remote left frontoparietal infarct. 2. Low grade stranding/edema in the right supraclavicular region, without cervical spine abnormality or other specific cause for this low grade stranding observed. 3. Scattered tooht decay, particularly of tooth 20. 9. No facial fracture. No cervical spine fracture or cervical spine subluxation.   Electronically Signed   By: Sherryl Barters M.D.   On: 06/14/2013 15:27    Microbiology: Recent Results (from the past 240 hour(s))  URINE CULTURE     Status: None   Collection Time    06/16/13 10:34 AM      Result Value Range Status    Specimen Description URINE, CLEAN CATCH   Final   Special Requests NONE   Final   Culture  Setup Time     Final   Value: 06/16/2013 17:47     Performed at [facility]     Final   Value: >=100,000 COLONIES/ML     Performed at [facility]   Culture     Final   Value: PROTEUS MIRABILIS     Performed at [facility]   Report Status 06/18/2013 FINAL   Final   Organism ID, Bacteria PROTEUS MIRABILIS   Final     Labs: Basic Metabolic Panel:  Recent Labs Lab 06/14/13 1303 06/15/13 0513 06/16/13 0545  NA 141 142 143  K 3.1* 3.5* 3.7  CL 102 108 111  CO2 23 21 22   GLUCOSE 117* 112* 87  BUN 13 15 13   CREATININE 0.55 0.71 0.58  CALCIUM 9.5 8.5 8.7   Liver Function Tests:  Recent Labs Lab 06/14/13 1303  AST 67*  ALT 25  ALKPHOS 74  BILITOT 1.0  PROT 7.4  ALBUMIN 3.3*    Recent Labs Lab 06/14/13 1303  LIPASE 17   No results found for this basename: AMMONIA,  in the last 168 hours CBC:  Recent Labs Lab 06/14/13 1303 06/15/13 0513 06/18/13 0125 06/19/13 0340  WBC 12.4* 10.5 7.6 8.5  NEUTROABS 9.9*  --   --   --   HGB 13.6 12.0 10.7* 11.8*  HCT 40.5 36.3 31.1* 35.6*  MCV 87.9 90.5 89.6 90.4  PLT 157 140* 179 214   Cardiac Enzymes:  Recent Labs Lab 06/14/13 1303 06/15/13 0513 06/16/13 0545  CKTOTAL 2232* 1295* 564*   BNP: BNP (last  3 results) No results found for this basename: PROBNP,  in the last 8760 hours CBG:  Recent Labs Lab 06/18/13 1109 06/18/13 1639 06/18/13 2138 06/19/13 0623 06/19/13 0734  GLUCAP 149* 151* 102* 98 98       Signed:  Caedyn Raygoza  [facility] 06/19/2013, 11:07 AM

## 2013-06-19 NOTE — Progress Notes (Signed)
ANTICOAGULATION CONSULT NOTE - Follow Up Consult  Pharmacy Consult:  Coumadin Indication: APS + history of DVT / PE / CVA  Allergies  Allergen Reactions  . Oxycodone-Acetaminophen Shortness Of Breath and Nausea Only  . Propoxyphene N-Acetaminophen Shortness Of Breath and Nausea Only  . Sulfonamide Derivatives Shortness Of Breath and Nausea Only  . Codeine     REACTION: nausea  . Latex     Patient Measurements: Height: 5' 4.17" (163 cm) Weight: 172 lb 9.9 oz (78.3 kg) IBW/kg (Calculated) : 55.1  Vital Signs: Temp: 98 F (36.7 C) (01/13 0940) Temp src: Oral (01/13 0940) BP: 146/90 mmHg (01/13 0940) Pulse Rate: 102 (01/13 0940)  Labs:  Recent Labs  06/17/13 0455 06/18/13 0125 06/18/13 0915 06/19/13 0340  HGB  --  10.7*  --  11.8*  HCT  --  31.1*  --  35.6*  PLT  --  179  --  214  LABPROT 17.4* 19.2*  --  23.8*  INR 1.47 1.67*  --  2.21*  HEPARINUNFRC  --  0.16* 0.34 0.42    Estimated Creatinine Clearance: 83.6 ml/min (by C-G formula based on Cr of 0.58).     Assessment: 37 YOF with history of recurrent CVAs, recurrent DVTs and PEs, and APS to continue on Coumadin.  INR therapeutic and IV heparin discontinued.  Cipro could increase the effect of Coumadin.  No bleeding reported.   Goal of Therapy:  INR 2 - 3 Monitor platelets by anticoagulation protocol: Yes    Plan:  - Coumadin 5mg  PO prior to d/c today.  Recommend discharging patient on home regimen since now started on Cipro.  INR check this week. - INR in AM if still here    Daishaun Ayre D. Mina Marble, PharmD, BCPS Pager:  [phone] 06/19/2013, 11:35 AM

## 2013-06-20 DIAGNOSIS — M109 Gout, unspecified: Secondary | ICD-10-CM

## 2013-06-20 DIAGNOSIS — G459 Transient cerebral ischemic attack, unspecified: Secondary | ICD-10-CM | POA: Diagnosis present

## 2013-06-20 DIAGNOSIS — E1129 Type 2 diabetes mellitus with other diabetic kidney complication: Secondary | ICD-10-CM

## 2013-06-20 DIAGNOSIS — E1165 Type 2 diabetes mellitus with hyperglycemia: Secondary | ICD-10-CM

## 2013-06-20 LAB — PROTIME-INR
INR: 2.48 — ABNORMAL HIGH (ref 0.00–1.49)
Prothrombin Time: 26 seconds — ABNORMAL HIGH (ref 11.6–15.2)

## 2013-06-20 LAB — GLUCOSE, CAPILLARY
Glucose-Capillary: 96 mg/dL (ref 70–99)
Glucose-Capillary: 161 mg/dL — ABNORMAL HIGH (ref 70–99)
Glucose-Capillary: 239 mg/dL — ABNORMAL HIGH (ref 70–99)

## 2013-06-20 MED ORDER — PREDNISONE 1 MG PO TABS: 1.0000 mg | ORAL_TABLET | Freq: Every day | ORAL | Status: DC

## 2013-06-20 MED ORDER — PREDNISONE 20 MG PO TABS
40.0000 mg | ORAL_TABLET | Freq: Every day | ORAL | Status: DC
  Filled 2013-06-20: qty 2

## 2013-06-20 MED ORDER — WARFARIN SODIUM 5 MG PO TABS
5.0000 mg | ORAL_TABLET | Freq: Once | ORAL | Status: DC
  Filled 2013-06-20: qty 1

## 2013-06-20 MED ORDER — PREDNISONE 20 MG PO TABS: 40.0000 mg | ORAL_TABLET | Freq: Every day | ORAL | Status: AC

## 2013-06-20 MED ORDER — METHYLPREDNISOLONE SODIUM SUCC 125 MG IJ SOLR
60.0000 mg | Freq: Once | INTRAMUSCULAR | Status: AC
  Administered 2013-06-20: 60 mg via INTRAVENOUS
  Filled 2013-06-20: qty 2

## 2013-06-20 NOTE — Progress Notes (Addendum)
Physical Therapy Treatment Patient Details Name: Connie Ruiz MRN: [id number] DOB: 02-17-1961 Today's Date: 06/20/2013 Time: [id number] PT Time Calculation (min): 22 min  PT Assessment / Plan / Recommendation  History of Present Illness Connie Ruiz is an 53 y.o. female with a history of antiphospholipid antibody syndrome, renal transplant on immune suppressants, previous strokes, DVT with pulmonary embolus and diabetes mellitus who was found in her bathtub, having been there apparently overnight and unable to get to her feet. MRI showed no signs of recurrent acute stroke   PT Comments   Patient able to tolerate some ambulation with +2 HHA today. Will continue to progress as tolerated.  Follow Up Recommendations  SNF;Supervision/Assistance - 24 hour           Equipment Recommendations  Other (comment) (TBD)       Frequency Min 3X/week   Progress towards PT Goals Progress towards PT goals: Progressing toward goals  Plan Current plan remains appropriate    Precautions / Restrictions Precautions Precautions: Fall Restrictions Weight Bearing Restrictions: No   Pertinent Vitals/Pain Patient reports pain, but no value given    Mobility  Bed Mobility Overal bed mobility: Needs Assistance Bed Mobility: Supine to Sit;Sit to Supine Supine to sit: Mod assist;+2 for physical assistance Sit to supine: Mod assist;+2 for physical assistance General bed mobility comments: Patient will not allow any physical assist or movement of RUE, withdrawls in pain.   Transfers Overall transfer level: Needs assistance Equipment used: 2 person hand held assist Transfers: Sit to/from Stand Sit to Stand: Min assist;+2 physical assistance General transfer comment: +2 for stability Ambulation/Gait Ambulation/Gait assistance: Mod assist;+2 physical assistance Ambulation Distance (Feet): 42 Feet Assistive device: 2 person hand held assist Gait Pattern/deviations: Step-to pattern;Decreased stride  length;Shuffle;Narrow base of support Gait velocity: decreased Gait velocity interpretation: <1.8 ft/sec, indicative of risk for recurrent falls      PT Goals (current goals can now be found in the care plan section) Acute Rehab PT Goals Patient Stated Goal: none stated PT Goal Formulation: With patient/family Time For Goal Achievement: 06/29/13 Potential to Achieve Goals: Fair  Visit Information  Last PT Received On: 06/20/13 Assistance Needed: +2 History of Present Illness: Connie Ruiz is an 53 y.o. female with a history of antiphospholipid antibody syndrome, renal transplant on immune suppressants, previous strokes, DVT with pulmonary embolus and diabetes mellitus who was found in her bathtub, having been there apparently overnight and unable to get to her feet. MRI showed no signs of recurrent acute stroke    Subjective Data  Patient Stated Goal: none stated   Cognition  Cognition Arousal/Alertness: Awake/alert Behavior During Therapy: Anxious;Flat affect Overall Cognitive Status: Impaired/Different from baseline Area of Impairment: Orientation;Problem solving Orientation Level: Place;Time;Situation Problem Solving: Slow processing;Decreased initiation;Difficulty sequencing General Comments: patient providing incorrect answers to home setup, daughter correcting (daughter stated these would be normal questions for patient to answer) patient also having difficulty with word finding, when prompted by question "is your home one level or do you have steps" patient mutters several unintelligible words required some additional cues to be able to answer appropraitely. patient also with poor attention to task.  Patient note to be pocketing food upon arrival on right cheeck as well and kept continuing to try to fit more crackers into her mouth not realizing that she had an entire mouth full of food.       End of Session PT - End of Session Equipment Utilized During Treatment: Gait  belt Activity Tolerance:  Patient tolerated treatment well;Patient limited by fatigue Patient left: in chair;with call bell/phone within reach Nurse Communication: Mobility status;Other (comment)   GP     Duncan Dull 06/20/2013, 12:45 PM Alben Deeds, [place] DPT  [phone]

## 2013-06-20 NOTE — Progress Notes (Signed)
ANTICOAGULATION CONSULT NOTE - Follow Up Consult  Pharmacy Consult:  Coumadin Indication: APS + history of DVT / PE / CVA  Allergies  Allergen Reactions  . Oxycodone-Acetaminophen Shortness Of Breath and Nausea Only  . Propoxyphene N-Acetaminophen Shortness Of Breath and Nausea Only  . Sulfonamide Derivatives Shortness Of Breath and Nausea Only  . Codeine     REACTION: nausea  . Latex     Patient Measurements: Height: 5' 4.17" (163 cm) Weight: 172 lb 9.9 oz (78.3 kg) IBW/kg (Calculated) : 55.1  Vital Signs: Temp: 98.7 F (37.1 C) (01/14 0926) Temp src: Oral (01/14 0926) BP: 133/84 mmHg (01/14 0926) Pulse Rate: 105 (01/14 0926)  Labs:  Recent Labs  06/18/13 0125 06/18/13 0915 06/19/13 0340 06/20/13 0522  HGB 10.7*  --  11.8*  --   HCT 31.1*  --  35.6*  --   PLT 179  --  214  --   LABPROT 19.2*  --  23.8* 26.0*  INR 1.67*  --  2.21* 2.48*  HEPARINUNFRC 0.16* 0.34 0.42  --     Estimated Creatinine Clearance: 83.6 ml/min (by C-G formula based on Cr of 0.58).     Assessment: Connie Ruiz with history of recurrent CVAs, recurrent DVTs and PEs, and APS to continue on Coumadin.  INR therapeutic; no bleeding reported.  Cipro could increase the effect of Coumadin.    Goal of Therapy:  INR 2 - 3 Monitor platelets by anticoagulation protocol: Yes    Plan:  - Coumadin 5mg  PO today - Daily PT / INR    Georgianne Gritz D. Mina Marble, PharmD, BCPS Pager:  [phone] 06/20/2013, 12:30 PM

## 2013-06-20 NOTE — Progress Notes (Signed)
  Clinical Social Work Department CLINICAL SOCIAL WORK PLACEMENT NOTE 06/20/2013  Patient:  Connie Ruiz, Connie Ruiz  Account Number:  [id number] Admit date:  06/14/2013  Clinical Social Worker:  Pete Pelt, CLINICAL SOCIAL WORKER  Date/time:  06/20/2013 08:35 AM  Clinical Social Work is seeking post-discharge placement for this patient at the following level of care:   [place]   (*CSW will update this form in Epic as items are completed)   06/19/2013  Patient/family provided with [place] Department of Clinical Social Work's list of facilities offering this level of care within the geographic area requested by the patient (or if unable, by the patient's family).  06/19/2013  Patient/family informed of their freedom to choose among providers that offer the needed level of care, that participate in Medicare, Medicaid or managed care program needed by the patient, have an available bed and are willing to accept the patient.  06/19/2013  Patient/family informed of [facility]' ownership interest in [facility], as well as of the fact that they are under no obligation to receive care at this facility.  PASARR submitted to [employer] on 03/23/2010 PASARR number received from [employer] on 03/23/2000  FL2 transmitted to all facilities in geographic area requested by pt/family on  06/18/2013 FL2 transmitted to all facilities within larger geographic area on 06/18/2013  Patient informed that his/her managed care company has contracts with or will negotiate with  certain facilities, including the following:     Patient/family informed of bed offers received:  06/18/2013 Patient chooses bed at [facility] Physician recommends and patient chooses bed at  [facility]  Patient to be transferred to [facility] on  06/20/2013 Patient to be transferred to facility by [facility]  The following physician request were entered in  Epic:   Additional Comments:    [place] 1-16;  6N1-16 Phone: [phone]

## 2013-06-20 NOTE — Progress Notes (Signed)
Pt to be d/c today to [facility]   Pt and family agreeable. Confirmed plans with facility.  Plan transfer via EMS.    [facility]  4N 1-16;  [phone] Phone: [phone]

## 2013-06-20 NOTE — Discharge Instructions (Signed)
Information on my medicine - Coumadin   (Warfarin)  This medication education was reviewed with me or my healthcare representative as part of my discharge preparation.  The pharmacist that spoke with me during my hospital stay was:  Saundra Shelling, [facility]  Why was Coumadin prescribed for you? Coumadin was prescribed for you because you have a blood clot or a medical condition that can cause an increased risk of forming blood clots. Blood clots can cause serious health problems by blocking the flow of blood to the heart, lung, or brain. Coumadin can prevent harmful blood clots from forming. As a reminder your indication for Coumadin is:   Deep Vein Thrombosis Treatment APS, DVTs, PEs, strokes  What test will check on my response to Coumadin? While on Coumadin (warfarin) you will need to have an INR test regularly to ensure that your dose is keeping you in the desired range. The INR (international normalized ratio) number is calculated from the result of the laboratory test called prothrombin time (PT).  If an INR APPOINTMENT HAS NOT ALREADY BEEN MADE FOR YOU please schedule an appointment to have this lab work done by your health care provider within 7 days. Your INR goal is usually a number between:  2 to 3 or your provider may give you a more narrow range like 2-2.5.  Ask your health care provider during an office visit what your goal INR is.  What  do you need to  know  About  COUMADIN? Take Coumadin (warfarin) exactly as prescribed by your healthcare provider about the same time each day.  DO NOT stop taking without talking to the doctor who prescribed the medication.  Stopping without other blood clot prevention medication to take the place of Coumadin may increase your risk of developing a new clot or stroke.  Get refills before you run out.  What do you do if you miss a dose? If you miss a dose, take it as soon as you remember on the same day then continue your regularly scheduled regimen the  next day.  Do not take two doses of Coumadin at the same time.  Important Safety Information A possible side effect of Coumadin (Warfarin) is an increased risk of bleeding. You should call your healthcare provider right away if you experience any of the following:   Bleeding from an injury or your nose that does not stop.   Unusual colored urine (red or dark brown) or unusual colored stools (red or black).   Unusual bruising for unknown reasons.   A serious fall or if you hit your head (even if there is no bleeding).  Some foods or medicines interact with Coumadin (warfarin) and might alter your response to warfarin. To help avoid this:   Eat a balanced diet, maintaining a consistent amount of Vitamin K.   Notify your provider about major diet changes you plan to make.   Avoid alcohol or limit your intake to 1 drink for women and 2 drinks for men per day. (1 drink is 5 oz. wine, 12 oz. beer, or 1.5 oz. liquor.)  Make sure that ANY health care provider who prescribes medication for you knows that you are taking Coumadin (warfarin).  Also make sure the healthcare provider who is monitoring your Coumadin knows when you have started a new medication including herbals and non-prescription products.  Coumadin (Warfarin)  Major Drug Interactions  Increased Warfarin Effect Decreased Warfarin Effect  Alcohol (large quantities) Antibiotics (esp. Septra/Bactrim, Flagyl, Cipro) Amiodarone (  Cordarone) °Aspirin (ASA) °Cimetidine (Tagamet) °Megestrol (Megace) °NSAIDs (ibuprofen, naproxen, etc.) °Piroxicam (Feldene) °Propafenone (Rythmol SR) °Propranolol (Inderal) °Isoniazid (INH) °Posaconazole (Noxafil) Barbiturates (Phenobarbital) °Carbamazepine (Tegretol) °Chlordiazepoxide (Librium) °Cholestyramine (Questran) °Griseofulvin °Oral Contraceptives °Rifampin °Sucralfate (Carafate) °Vitamin K  ° °Coumadin® (Warfarin) Major Herbal Interactions  °Increased Warfarin Effect Decreased Warfarin Effect   °Garlic °Ginseng °Ginkgo biloba Coenzyme Q10 °Green tea °St. John’s wort   ° °Coumadin® (Warfarin) FOOD Interactions  °Eat a consistent number of servings per week of foods HIGH in Vitamin K °(1 serving = ½ cup)  °Collards (cooked, or boiled & drained) °Kale (cooked, or boiled & drained) °Mustard greens (cooked, or boiled & drained) °Parsley *serving size only = ¼ cup °Spinach (cooked, or boiled & drained) °Swiss chard (cooked, or boiled & drained) °Turnip greens (cooked, or boiled & drained)  °Eat a consistent number of servings per week of foods MEDIUM-HIGH in Vitamin K °(1 serving = 1 cup)  °Asparagus (cooked, or boiled & drained) °Broccoli (cooked, boiled & drained, or raw & chopped) °Brussel sprouts (cooked, or boiled & drained) *serving size only = ½ cup °Lettuce, raw (green leaf, endive, romaine) °Spinach, raw °Turnip greens, raw & chopped  ° °These websites have more information on Coumadin (warfarin):  www.coumadin.com; °www.ahrq.gov/consumer/coumadin.htm; ° ° ° °

## 2013-06-20 NOTE — Progress Notes (Signed)
CSW left a message with [facility] for d/c planning to facility today.   CSW will continue to follow Pt for d/c planning.    [facility]  4N 1-16;  [phone] Phone: [phone]

## 2013-06-20 NOTE — Progress Notes (Signed)
Report given to the nurse in the facility. Awaiting on [facility].

## 2013-06-20 NOTE — Progress Notes (Signed)
[facility] PROGRESS NOTE  Connie Ruiz [id number] DOB: 10/18/1960 DOA: 06/14/2013 PCP: Renato Shin, MD Brief HPI:  Patient is a 53 year old female with history of prior stroke with right-sided residual weakness, history of antiphospholipid syndrome on Coumadin, renal transplant on immunosuppressants, recurrent DVT, PE, diabetes was brought to the ER by her family after found in the bathtub today, where she was lying whole night. Family members noticed that she does have left-sided facial droop at baseline which is worse, they also feel her right arm and leg weakness is also worsened along with the speech/dysarthria. CT head is negative for any acute stroke. She was admitted for CVA work up.    Assessment/Plan:  Acute encephalopathy with a worsened right-sided weakness/TIA, history of prior multiple strokes  MRI does not show any acute infarct . Encephalomalacia related to left MCA infarct.   Neurology has been consulted. EEG does not show epileptiform activity.  Echocardiogram  Reveals the estimated ejection fraction was in the range of 60% to 65%. Wall motion was normal; there were no regional wall motion abnormalities. Doppler parameters are consistent with abnormal left ventricular relaxation (grade 1 diastolic dysfunction). PT evaluated and recommended CIR, but daughter wanted to take her to [facility]. She is resumed on her coumadin, but as her INR is now therapeutic , continue coumadin. Further recommendations as per neurology.   HYPERLIPIDEMIA  -  continue Zocor  HYPERTENSION  - Continue Cardizem   Type II or unspecified type diabetes mellitus with renal manifestations, uncontrolled(250.42)  - Continue sliding scale insulin,. CBG (last 3)   Recent Labs  06/19/13 2120 06/20/13 0626 06/20/13 1057  GLUCAP 152* 96 161*     Rhabdomyolysis  - Placed on IV fluid hydration , ck level improved.   Hypokalemia: Placed on potassium replacement   History of renal  transplant  - Continue prednisone, Prograf, CellCept   Proteus Mirabilis UTI Patient s/p IV Rocephin. Currently on oral cipro to finish course.  Multiple scab wounds on the knees and ont he right elbow : x rays have been negative for abscess or fractures.   Probable acute Gouty flare: Patient with right shoulder and elbow pain, with history of gout. Xrays negative. Will give a dose of IV SDolumedrol and oral steroid course of 4 days, then back to home regimen of prednisone 1 mg daily.  DVT prophylaxis.      Code Status: full code Family Communication: discussed the plan of care with the patient's daughter at bedside Disposition Plan: D/C to SNF when bed available   Consultants:  neurology  Procedures:  MRI brain   MRA HEAD and neck  Echocardiogram   Carotid duplex  Xray Right shoulder and elbow  Antibiotics:  none  HPI/Subjective: Reports pain in the right shoulder and right elbow. No complaints.  Objective: Filed Vitals:   06/20/13 0926  BP: 133/84  Pulse: 105  Temp: 98.7 F (37.1 C)  Resp: 18    Intake/Output Summary (Last 24 hours) at 06/20/13 1143 Last data filed at 06/20/13 0820  Gross per 24 hour  Intake    600 ml  Output      0 ml  Net    600 ml   Filed Weights   06/14/13 1700  Weight: 78.3 kg (172 lb 9.9 oz)    Exam:   General:  alert afebrile in mild distress from the pain in the right elbow.   Cardiovascular: s1s2  Respiratory: ctab  Abdomen: soft NT ND BS+  Musculoskeletal: TRACE PEDAL  EDEMA. Multiple skin breaks over the right elbow. Right shoulder and elbow exquistely TTP.  Data Reviewed: Basic Metabolic Panel:  Recent Labs Lab 06/14/13 1303 06/15/13 0513 06/16/13 0545  NA 141 142 143  K 3.1* 3.5* 3.7  CL 102 108 111  CO2 23 21 22   GLUCOSE 117* 112* 87  BUN 13 15 13   CREATININE 0.55 0.71 0.58  CALCIUM 9.5 8.5 8.7   Liver Function Tests:  Recent Labs Lab 06/14/13 1303  AST 67*  ALT 25  ALKPHOS 74   BILITOT 1.0  PROT 7.4  ALBUMIN 3.3*    Recent Labs Lab 06/14/13 1303  LIPASE 17   No results found for this basename: AMMONIA,  in the last 168 hours CBC:  Recent Labs Lab 06/14/13 1303 06/15/13 0513 06/18/13 0125 06/19/13 0340  WBC 12.4* 10.5 7.6 8.5  NEUTROABS 9.9*  --   --   --   HGB 13.6 12.0 10.7* 11.8*  HCT 40.5 36.3 31.1* 35.6*  MCV 87.9 90.5 89.6 90.4  PLT 157 140* 179 214   Cardiac Enzymes:  Recent Labs Lab 06/14/13 1303 06/15/13 0513 06/16/13 0545  CKTOTAL 2232* 1295* 564*   BNP (last 3 results) No results found for this basename: PROBNP,  in the last 8760 hours CBG:  Recent Labs Lab 06/19/13 1213 06/19/13 1711 06/19/13 2120 06/20/13 0626 06/20/13 1057  GLUCAP 115* 116* 152* 96 161*    Recent Results (from the past 240 hour(s))  URINE CULTURE     Status: None   Collection Time    06/16/13 10:34 AM      Result Value Range Status   Specimen Description URINE, CLEAN CATCH   Final   Special Requests NONE   Final   Culture  Setup Time     Final   Value: 06/16/2013 17:47     Performed at [facility]     Final   Value: >=100,000 COLONIES/ML     Performed at [facility]   Culture     Final   Value: PROTEUS MIRABILIS     Performed at [facility]   Report Status 06/18/2013 FINAL   Final   Organism ID, Bacteria PROTEUS MIRABILIS   Final     Studies: No results found.  Scheduled Meds: . calcitRIOL  0.25 mcg Oral Daily  . ciprofloxacin  500 mg Oral BID  . darifenacin  15 mg Oral Daily  . diltiazem  180 mg Oral Daily  . folic acid  1 mg Oral Daily  . losartan  50 mg Oral Daily  . methylPREDNISolone (SOLU-MEDROL) injection  60 mg Intravenous Once  . mycophenolate  250 mg Oral BID  . [START ON 06/21/2013] predniSONE  40 mg Oral QAC breakfast  . sertraline  100 mg Oral Daily  . simvastatin  10 mg Oral Daily  . tacrolimus  0.5 mg Oral BID  . Warfarin - Pharmacist Dosing Inpatient   Does not apply  q1800   Continuous Infusions:    Principal Problem:   Acute encephalopathy Active Problems:   TIA (transient ischemic attack)   HYPERLIPIDEMIA   HYPERTENSION   Type II or unspecified type diabetes mellitus with renal manifestations, uncontrolled   History of stroke with residual effects   Right sided weakness   Rhabdomyolysis   Gout flare: R elbow and R shoulder    Time spent: 35 min    Estill Llerena MD [facility] Pager [phone]. If 7PM-7AM, please contact night-coverage  at [email or web address], password [facility] 06/20/2013, 11:43 AM  LOS: 6 days

## 2013-06-20 NOTE — Progress Notes (Signed)
Patient is discharged from room 4N16 at this time. Alert and in stable condition. IV site d/c'd. Transported by [facility] with belongings.

## 2013-07-16 ENCOUNTER — Ambulatory Visit: Payer: PRIVATE HEALTH INSURANCE | Admitting: Endocrinology

## 2013-07-23 ENCOUNTER — Encounter (HOSPITAL_COMMUNITY): Payer: Self-pay | Admitting: Emergency Medicine

## 2013-07-23 ENCOUNTER — Emergency Department (HOSPITAL_COMMUNITY)
Admission: EM | Admit: 2013-07-23 | Discharge: 2013-07-23 | Disposition: A | Discharge status: Home / Self Care | Payer: PRIVATE HEALTH INSURANCE | Attending: Emergency Medicine | Admitting: Emergency Medicine

## 2013-07-23 ENCOUNTER — Emergency Department (HOSPITAL_COMMUNITY): Payer: PRIVATE HEALTH INSURANCE

## 2013-07-23 DIAGNOSIS — Y9301 Activity, walking, marching and hiking: Secondary | ICD-10-CM | POA: Insufficient documentation

## 2013-07-23 DIAGNOSIS — Z7901 Long term (current) use of anticoagulants: Secondary | ICD-10-CM | POA: Insufficient documentation

## 2013-07-23 DIAGNOSIS — M329 Systemic lupus erythematosus, unspecified: Secondary | ICD-10-CM | POA: Insufficient documentation

## 2013-07-23 DIAGNOSIS — S0990XA Unspecified injury of head, initial encounter: Secondary | ICD-10-CM | POA: Insufficient documentation

## 2013-07-23 DIAGNOSIS — I509 Heart failure, unspecified: Secondary | ICD-10-CM | POA: Insufficient documentation

## 2013-07-23 DIAGNOSIS — K219 Gastro-esophageal reflux disease without esophagitis: Secondary | ICD-10-CM | POA: Insufficient documentation

## 2013-07-23 DIAGNOSIS — Z91040 Latex allergy status: Secondary | ICD-10-CM | POA: Insufficient documentation

## 2013-07-23 DIAGNOSIS — F3289 Other specified depressive episodes: Secondary | ICD-10-CM | POA: Insufficient documentation

## 2013-07-23 DIAGNOSIS — F329 Major depressive disorder, single episode, unspecified: Secondary | ICD-10-CM | POA: Insufficient documentation

## 2013-07-23 DIAGNOSIS — Z86711 Personal history of pulmonary embolism: Secondary | ICD-10-CM | POA: Insufficient documentation

## 2013-07-23 DIAGNOSIS — M109 Gout, unspecified: Secondary | ICD-10-CM | POA: Insufficient documentation

## 2013-07-23 DIAGNOSIS — M810 Age-related osteoporosis without current pathological fracture: Secondary | ICD-10-CM | POA: Insufficient documentation

## 2013-07-23 DIAGNOSIS — Z87448 Personal history of other diseases of urinary system: Secondary | ICD-10-CM | POA: Insufficient documentation

## 2013-07-23 DIAGNOSIS — S79919A Unspecified injury of unspecified hip, initial encounter: Secondary | ICD-10-CM | POA: Insufficient documentation

## 2013-07-23 DIAGNOSIS — W010XXA Fall on same level from slipping, tripping and stumbling without subsequent striking against object, initial encounter: Secondary | ICD-10-CM | POA: Insufficient documentation

## 2013-07-23 DIAGNOSIS — Z86718 Personal history of other venous thrombosis and embolism: Secondary | ICD-10-CM | POA: Insufficient documentation

## 2013-07-23 DIAGNOSIS — Z8619 Personal history of other infectious and parasitic diseases: Secondary | ICD-10-CM | POA: Insufficient documentation

## 2013-07-23 DIAGNOSIS — E119 Type 2 diabetes mellitus without complications: Secondary | ICD-10-CM | POA: Insufficient documentation

## 2013-07-23 DIAGNOSIS — S0993XA Unspecified injury of face, initial encounter: Secondary | ICD-10-CM | POA: Insufficient documentation

## 2013-07-23 DIAGNOSIS — Z79899 Other long term (current) drug therapy: Secondary | ICD-10-CM | POA: Insufficient documentation

## 2013-07-23 DIAGNOSIS — W19XXXA Unspecified fall, initial encounter: Secondary | ICD-10-CM

## 2013-07-23 DIAGNOSIS — Y92009 Unspecified place in unspecified non-institutional (private) residence as the place of occurrence of the external cause: Secondary | ICD-10-CM | POA: Insufficient documentation

## 2013-07-23 DIAGNOSIS — IMO0002 Reserved for concepts with insufficient information to code with codable children: Secondary | ICD-10-CM | POA: Insufficient documentation

## 2013-07-23 DIAGNOSIS — Z940 Kidney transplant status: Secondary | ICD-10-CM | POA: Insufficient documentation

## 2013-07-23 DIAGNOSIS — E785 Hyperlipidemia, unspecified: Secondary | ICD-10-CM | POA: Insufficient documentation

## 2013-07-23 DIAGNOSIS — S199XXA Unspecified injury of neck, initial encounter: Secondary | ICD-10-CM

## 2013-07-23 DIAGNOSIS — I10 Essential (primary) hypertension: Secondary | ICD-10-CM | POA: Insufficient documentation

## 2013-07-23 DIAGNOSIS — W1809XA Striking against other object with subsequent fall, initial encounter: Secondary | ICD-10-CM | POA: Insufficient documentation

## 2013-07-23 DIAGNOSIS — S79929A Unspecified injury of unspecified thigh, initial encounter: Secondary | ICD-10-CM

## 2013-07-23 DIAGNOSIS — I69959 Hemiplegia and hemiparesis following unspecified cerebrovascular disease affecting unspecified side: Secondary | ICD-10-CM | POA: Insufficient documentation

## 2013-07-23 NOTE — ED Notes (Signed)
Pt  Was walking and fell , pt states that she just tripped ,she was initially c/o right leg pain which she says is gone now

## 2013-07-23 NOTE — Discharge Instructions (Signed)

## 2013-07-23 NOTE — ED Provider Notes (Signed)
CSN: [id number]     Arrival date & time 07/23/13  [id number] History   First MD Initiated Contact with Patient 07/23/13 [phone]     Chief Complaint  Patient presents with  . Fall     (Consider location/radiation/quality/duration/timing/severity/associated sxs/prior Treatment) HPI Comments: Pt lives at home uses a walker normally due to h/o stroke with right side weakness, was walking without it this AM to go to the bathroom and lost balance and fell onto right buttock.  Did hit head lightly but no LOC, neck pain, HA.  No new weakness per pt.  No CP, SOB, abd pain, back pain.  Pt initially had worse right hip area pain, but reports it is improved now without any specific intervention.  Pt arrives by EMS.  Pt lives with daughter.    Patient is a 53 y.o. female presenting with fall. The history is provided by the patient.  Fall Pertinent negatives include no chest pain, no headaches and no shortness of breath.    Past Medical History  Diagnosis Date  . THYROID NODULE, LEFT 04/10/2009  . DIABETES MELLITUS, TYPE II 08/21/2007  . HYPERLIPIDEMIA 08/21/2007  . GOUT 08/21/2007  . HYPERTENSION 08/21/2007    Dr. Andree Elk, [place] 08/21/2007  . CEREBROVASCULAR ACCIDENT, ACUTE 04/15/2010  . GERD 08/21/2007  . RENAL INSUFFICIENCY 08/21/2007  . LUPUS 08/21/2007  . OSTEOPOROSIS 08/21/2007    Rheumatol at [facility]  . DVT, HX OF 08/21/2007  . CLOSTRIDIUM DIFFICILE COLITIS, HX OF 08/21/2007  . KIDNEY TRANSPLANTATION, HX OF 08/22/2007    s/p renal transplant-Dr. Andree Elk, [facility]  . Pulmonary embolism 07/16/2010  . Renal failure   . Current use of long term anticoagulation     Dr. Andree Elk, [facility]  . Depression     Dr. Andree Elk, [facility]  . History of stroke with residual effects   . Right sided weakness   . CVA 04/17/2010   Past Surgical History  Procedure Laterality Date  . Cholecystectomy    . Tubal ligation    . Kidney transplant Right 2009  . Renal biopsy, open  1981   Family History   Problem Relation Age of Onset  . Cancer Neg Hx   . Heart attack Mother   . Heart disease Father    History  Substance Use Topics  . Smoking status: Never Smoker   . Smokeless tobacco: Not on file  . Alcohol Use: No   OB History   Grav Para Term Preterm Abortions TAB SAB Ect Mult Living                 Review of Systems  Respiratory: Negative for shortness of breath.   Cardiovascular: Negative for chest pain.  Musculoskeletal: Positive for arthralgias. Negative for back pain and neck pain.  Neurological: Negative for dizziness, syncope, light-headedness and headaches.      Allergies  Oxycodone-acetaminophen; Propoxyphene n-acetaminophen; Sulfonamide derivatives; Codeine; and Latex  Home Medications   Current Outpatient Rx  Name  Route  Sig  Dispense  Refill  . calcitRIOL (ROCALTROL) 0.25 MCG capsule   Oral   Take 0.25 mcg by mouth daily.         Marland Kitchen diltiazem (TIAZAC) 180 MG 24 hr capsule   Oral   Take 180 mg by mouth daily.         Marland Kitchen esomeprazole (NEXIUM) 20 MG capsule   Oral   Take 20 mg by mouth daily at 12 noon.         Marland Kitchen  folic acid (FOLVITE) 1 MG tablet   Oral   Take 1 mg by mouth daily.         Marland Kitchen losartan (COZAAR) 50 MG tablet   Oral   Take 50 mg by mouth daily.         . mycophenolate (CELLCEPT) 250 MG capsule   Oral   Take 250 mg by mouth 2 (two) times daily.         Marland Kitchen nystatin (MYCOSTATIN) powder   Topical   Apply topically 4 (four) times daily.         . predniSONE (DELTASONE) 1 MG tablet   Oral   Take 1 tablet (1 mg total) by mouth daily with breakfast.         . senna-docusate (SENOKOT-S) 8.6-50 MG per tablet   Oral   Take 1 tablet by mouth at bedtime as needed for mild constipation.         . sertraline (ZOLOFT) 100 MG tablet   Oral   Take 100 mg by mouth daily.         . simvastatin (ZOCOR) 20 MG tablet   Oral   Take 20 mg by mouth daily.         . solifenacin (VESICARE) 10 MG tablet   Oral   Take by  mouth daily.         . tacrolimus (PROGRAF) 0.5 MG capsule   Oral   Take 0.5 mg by mouth 2 (two) times daily.         Marland Kitchen warfarin (COUMADIN) 5 MG tablet   Oral   Take 5-7.5 mg by mouth See admin instructions. Take 1.5 tablets on Monday and Friday. Take 1 tablet all other days.          BP 161/94  Pulse 91  Temp(Src) 97.8 F (36.6 C) (Oral)  Resp 16  SpO2 100% Physical Exam  Nursing note and vitals reviewed. Constitutional: She appears well-developed and well-nourished. No distress.  HENT:  Head: Normocephalic and atraumatic.  Eyes: Conjunctivae are normal. Pupils are equal, round, and reactive to light.  Neck: Normal range of motion. Neck supple. No spinous process tenderness and no muscular tenderness present.  Cardiovascular: Normal rate and intact distal pulses.   Pulmonary/Chest: Effort normal. She has no wheezes.  Abdominal: Soft. She exhibits no distension. There is no tenderness.  Musculoskeletal:       Right hip: She exhibits decreased range of motion and tenderness. She exhibits no crepitus, no deformity and no laceration.  Neurological: She is alert.  Skin: Skin is warm and dry. No rash noted. She is not diaphoretic.  Psychiatric: She has a normal mood and affect.    ED Course  Procedures (including critical care time)   RA sat is 100% and I interpret to be normal    8:48 AM I reviewed plain films of hip myself and radiologist comments.  No fracture. MDM   Final diagnoses:  Fall     Pt with mechanical fall, likely related to prior CVA.  Pt with word finding difficulty, she reports is at baseline.  She has mild pain to right hip.  She has limited ROM of hip already, unsure if it is at baseline.  Will get plain film of hip.  No neck or back pain, step off or tenderness.      Saddie Benders. Dorna Mai, MD 07/27/13 [phone]

## 2013-07-27 ENCOUNTER — Telehealth: Payer: Self-pay

## 2013-07-27 NOTE — Telephone Encounter (Signed)
Physical Therapist informed. 

## 2013-07-27 NOTE — Telephone Encounter (Signed)
ok 

## 2013-07-27 NOTE — Telephone Encounter (Signed)
Physical Therapist [facility] called and asked for a verbal orders to continue Physical Therapy for pt.  Please advise, Thanks!

## 2013-08-01 ENCOUNTER — Telehealth: Payer: Self-pay

## 2013-08-01 NOTE — Telephone Encounter (Signed)
Correction occupational therapy.  Tommi Emery Therapist informed.

## 2013-08-01 NOTE — Telephone Encounter (Signed)
Physical Therapist with [facility] requesting verbal orders to see pt 2 times a week for 3 weeks and the 1 time a week for 1 week. Pt stated the pt would be working on balance, ADL's, Adaptive equipment usage, transfer training, home exercise. Also therapist stated that yesterday during visit resting heart rate was 100 and after walking rate was 108. Pt has been complaining of pain in right side since a fall in January and daughter was advised to make appointment.  Ok to continue with PT? Thanks!

## 2013-08-01 NOTE — Telephone Encounter (Signed)
ok 

## 2013-08-03 ENCOUNTER — Telehealth: Payer: Self-pay

## 2013-08-03 NOTE — Telephone Encounter (Signed)
ok 

## 2013-08-03 NOTE — Telephone Encounter (Signed)
Physical Therapist Sherd with [facility] called requesting a verbal for speech therapy. Please advise, Thanks!

## 2013-08-03 NOTE — Telephone Encounter (Signed)
Therapist informed.

## 2013-08-06 DIAGNOSIS — I69998 Other sequelae following unspecified cerebrovascular disease: Secondary | ICD-10-CM

## 2013-08-06 DIAGNOSIS — G9340 Encephalopathy, unspecified: Secondary | ICD-10-CM

## 2013-08-06 DIAGNOSIS — M6282 Rhabdomyolysis: Secondary | ICD-10-CM

## 2013-08-07 ENCOUNTER — Ambulatory Visit: Payer: PRIVATE HEALTH INSURANCE | Admitting: Endocrinology

## 2013-08-08 ENCOUNTER — Ambulatory Visit: Payer: PRIVATE HEALTH INSURANCE | Admitting: Endocrinology

## 2013-08-10 ENCOUNTER — Other Ambulatory Visit: Payer: Self-pay

## 2013-08-10 ENCOUNTER — Ambulatory Visit (INDEPENDENT_AMBULATORY_CARE_PROVIDER_SITE_OTHER): Payer: PRIVATE HEALTH INSURANCE | Admitting: Endocrinology

## 2013-08-10 VITALS — BP 122/86 | HR 115 | Temp 97.6°F | Ht 64.0 in | Wt 181.0 lb

## 2013-08-10 DIAGNOSIS — E041 Nontoxic single thyroid nodule: Secondary | ICD-10-CM

## 2013-08-10 MED ORDER — INCONTINENCE SUPPLIES MISC: Status: DC

## 2013-08-10 MED ORDER — ATORVASTATIN CALCIUM 40 MG PO TABS: 40.0000 mg | ORAL_TABLET | Freq: Every day | ORAL | Status: DC

## 2013-08-10 NOTE — Progress Notes (Signed)
Subjective:    Patient ID: Connie Ruiz, female    DOB: 1961-05-29, 53 y.o.   MRN: [id number]  HPI The state of at least three ongoing medical problems is addressed today, with interval history of each noted here: Depression: zoloft works well. Incontinence: persists despite medication.  She requests incontinence pads. DM: she has not required medication.  She denies weight change. Dyslipidemia: she says she takes zocor as rx'ed. Past Medical History  Diagnosis Date  . THYROID NODULE, LEFT 04/10/2009  . DIABETES MELLITUS, TYPE II 08/21/2007  . HYPERLIPIDEMIA 08/21/2007  . GOUT 08/21/2007  . HYPERTENSION 08/21/2007    Dr. Andree Elk, [place] 08/21/2007  . CEREBROVASCULAR ACCIDENT, ACUTE 04/15/2010  . GERD 08/21/2007  . RENAL INSUFFICIENCY 08/21/2007  . LUPUS 08/21/2007  . OSTEOPOROSIS 08/21/2007    Rheumatol at [facility]  . DVT, HX OF 08/21/2007  . CLOSTRIDIUM DIFFICILE COLITIS, HX OF 08/21/2007  . KIDNEY TRANSPLANTATION, HX OF 08/22/2007    s/p renal transplant-Dr. Andree Elk, [facility]  . Pulmonary embolism 07/16/2010  . Renal failure   . Current use of long term anticoagulation     Dr. Andree Elk, [facility]  . Depression     Dr. Andree Elk, [facility]  . History of stroke with residual effects   . Right sided weakness   . CVA 04/17/2010    Past Surgical History  Procedure Laterality Date  . Cholecystectomy    . Tubal ligation    . Kidney transplant Right 2009  . Renal biopsy, open  1981    History   Social History  . Marital Status: Divorced    Spouse Name: N/A    Number of Children: 1  . Years of Education: N/A   Occupational History  . DISABILITY    Social History Main Topics  . Smoking status: Never Smoker   . Smokeless tobacco: Not on file  . Alcohol Use: No  . Drug Use: No  . Sexual Activity: Not on file   Other Topics Concern  . Not on file   Social History Narrative   Retired   Regular exercise-no    Current Outpatient Prescriptions on File Prior to  Visit  Medication Sig Dispense Refill  . calcitRIOL (ROCALTROL) 0.25 MCG capsule Take 0.25 mcg by mouth daily.      Marland Kitchen diltiazem (TIAZAC) 180 MG 24 hr capsule Take 180 mg by mouth daily.      Marland Kitchen esomeprazole (NEXIUM) 20 MG capsule Take 20 mg by mouth daily at 12 noon.      . folic acid (FOLVITE) 1 MG tablet Take 1 mg by mouth daily.      Marland Kitchen losartan (COZAAR) 50 MG tablet Take 50 mg by mouth daily.      . mycophenolate (CELLCEPT) 250 MG capsule Take 250 mg by mouth 2 (two) times daily.      Marland Kitchen nystatin (MYCOSTATIN) powder Apply topically 4 (four) times daily.      . predniSONE (DELTASONE) 1 MG tablet Take 1 tablet (1 mg total) by mouth daily with breakfast.      . senna-docusate (SENOKOT-S) 8.6-50 MG per tablet Take 1 tablet by mouth at bedtime as needed for mild constipation.      . sertraline (ZOLOFT) 100 MG tablet Take 100 mg by mouth daily.      . solifenacin (VESICARE) 10 MG tablet Take by mouth daily.      . tacrolimus (PROGRAF) 0.5 MG capsule Take 0.5 mg by mouth 2 (two) times daily.      Marland Kitchen  warfarin (COUMADIN) 5 MG tablet Take 5-7.5 mg by mouth See admin instructions. Take 1.5 tablets on Monday and Friday. Take 1 tablet all other days.       No current facility-administered medications on file prior to visit.    Allergies  Allergen Reactions  . Oxycodone-Acetaminophen Shortness Of Breath and Nausea Only  . Propoxyphene N-Acetaminophen Shortness Of Breath and Nausea Only  . Sulfonamide Derivatives Shortness Of Breath and Nausea Only  . Codeine     REACTION: nausea  . Latex Rash    Family History  Problem Relation Age of Onset  . Cancer Neg Hx   . Heart attack Mother   . Heart disease Father     BP 122/86  Pulse 115  Temp(Src) 97.6 F (36.4 C) (Oral)  Ht 5\' 4"  (1.626 m)  Wt 181 lb (82.101 kg)  BMI 31.05 kg/m2  SpO2 97%  Review of Systems She denies sob.  Edema is unchanged    Objective:   Physical Exam VITAL SIGNS:  See vs page GENERAL: no distress.  In  wheelchair.  Lab Results  Component Value Date   HGBA1C 6.1* 06/15/2013   Lab Results  Component Value Date   CHOL 206* 06/15/2013   HDL 61 06/15/2013   LDLCALC 122* 06/15/2013   TRIG 114 06/15/2013   CHOLHDL 3.4 06/15/2013      Assessment & Plan:  DM: well-controlled, without medication Depression: well-controlled Urinary incontinence: persistent despite medication. Dyslipidemia: she needs increased rx HTN: cardizem can interact with lipitor, but she is on submaximal dosages of both.

## 2013-08-10 NOTE — Patient Instructions (Addendum)
Please come back for a regular physical appointment in 3 months. blood tests are being requested for you today.  We'll contact you with results. Please change the simvastatin to lipitor.  i have sent a prescription to your pharmacy.

## 2013-08-11 LAB — TSH: TSH: 1.637 u[IU]/mL (ref 0.350–4.500)

## 2013-08-13 ENCOUNTER — Other Ambulatory Visit: Payer: Self-pay | Admitting: Endocrinology

## 2013-08-13 DIAGNOSIS — I499 Cardiac arrhythmia, unspecified: Secondary | ICD-10-CM | POA: Insufficient documentation

## 2013-08-23 ENCOUNTER — Ambulatory Visit (INDEPENDENT_AMBULATORY_CARE_PROVIDER_SITE_OTHER): Payer: PRIVATE HEALTH INSURANCE | Admitting: *Deleted

## 2013-08-23 DIAGNOSIS — I6350 Cerebral infarction due to unspecified occlusion or stenosis of unspecified cerebral artery: Secondary | ICD-10-CM

## 2013-08-23 DIAGNOSIS — I2699 Other pulmonary embolism without acute cor pulmonale: Secondary | ICD-10-CM

## 2013-08-23 LAB — POCT INR: INR: 3.5

## 2013-08-31 ENCOUNTER — Encounter: Payer: Self-pay | Admitting: Cardiovascular Disease

## 2013-08-31 ENCOUNTER — Ambulatory Visit (INDEPENDENT_AMBULATORY_CARE_PROVIDER_SITE_OTHER): Payer: PRIVATE HEALTH INSURANCE | Admitting: Surgery

## 2013-08-31 ENCOUNTER — Ambulatory Visit (INDEPENDENT_AMBULATORY_CARE_PROVIDER_SITE_OTHER): Payer: PRIVATE HEALTH INSURANCE | Admitting: Cardiovascular Disease

## 2013-08-31 VITALS — BP 148/92 | HR 98 | Ht 64.0 in | Wt 182.0 lb

## 2013-08-31 DIAGNOSIS — E785 Hyperlipidemia, unspecified: Secondary | ICD-10-CM | POA: Diagnosis not present

## 2013-08-31 DIAGNOSIS — R002 Palpitations: Secondary | ICD-10-CM | POA: Insufficient documentation

## 2013-08-31 DIAGNOSIS — I499 Cardiac arrhythmia, unspecified: Secondary | ICD-10-CM

## 2013-08-31 MED ORDER — DILTIAZEM HCL ER BEADS 240 MG PO CP24: 240.0000 mg | ORAL_CAPSULE | Freq: Every day | ORAL | Status: DC

## 2013-08-31 NOTE — Assessment & Plan Note (Signed)
Mildly elevated today on prepping medications. She is on Cardizem CD 180. She was referred for rapid heartbeat during rehabilitation. I'm going to increase this to 240 mg a day and obtain any event monitor.

## 2013-08-31 NOTE — Assessment & Plan Note (Signed)
The patient was referred for "rapid heartbeat" during physical activity especially noted during rehabilitation therapy sessions. There is no history of atrial fibrillation. She is on Coumadin because of prior strokes. She says she feels palpitations. I suspect the mildly elevated heart rate is related to deconditioning. I'm going to obtain an event monitor and increase her Cardizem CD from 180-240 mg daily.

## 2013-08-31 NOTE — Assessment & Plan Note (Signed)
On statin therapy followed by her PCP 

## 2013-08-31 NOTE — Progress Notes (Signed)
08/31/2013 Connie Ruiz   1960/11/14  [id number]  Primary Physician Connie Shin, MD Primary Cardiologist: Connie Harp MD Connie Ruiz   HPI:  Connie Ruiz is a 53 year old severely overweight divorced African American American female mother of one daughter who is accompanying her today. She was referred by Dr. Renato Ruiz for cardiovascular evaluation because of "tachycardia". Her past history is remarkable for treated hypertension and hyperlipidemia. She apparently had systemic lupus erythematosus which he is disabled from. Her mother died of microfracture at age 50. She does not smoke nor is he diabetic. She has had several strokes in the past most recently in January of this year with some right-sided parses and numbness. She walks with the aid of a cane and is minimally ambulatory. She is getting stroke rehabilitation and home physical therapy. Apparently she's noticed mildly elevated heart rates during these sessions and was referred here.   Current Outpatient Prescriptions  Medication Sig Dispense Refill  . atorvastatin (LIPITOR) 40 MG tablet Take 1 tablet (40 mg total) by mouth daily.  90 tablet  3  . calcitRIOL (ROCALTROL) 0.25 MCG capsule Take 0.25 mcg by mouth daily.      Marland Kitchen diltiazem (TIAZAC) 240 MG 24 hr capsule Take 1 capsule (240 mg total) by mouth daily.  30 capsule  11  . esomeprazole (NEXIUM) 20 MG capsule Take 20 mg by mouth daily at 12 noon.      . folic acid (FOLVITE) 1 MG tablet Take 1 mg by mouth daily.      . Incontinence Supplies MISC "pull-up" type, 6 per day  200 each  11  . losartan (COZAAR) 50 MG tablet Take 50 mg by mouth daily.      . mycophenolate (CELLCEPT) 250 MG capsule Take 250 mg by mouth 2 (two) times daily.      Marland Kitchen nystatin (MYCOSTATIN) powder Apply topically 4 (four) times daily.      . predniSONE (DELTASONE) 1 MG tablet Take 1 tablet (1 mg total) by mouth daily with breakfast.      . senna-docusate (SENOKOT-S) 8.6-50 MG per tablet Take 1  tablet by mouth at bedtime as needed for mild constipation.      . sertraline (ZOLOFT) 100 MG tablet Take 100 mg by mouth daily.      . solifenacin (VESICARE) 10 MG tablet Take by mouth daily.      . tacrolimus (PROGRAF) 0.5 MG capsule Take 0.5 mg by mouth 2 (two) times daily.      Marland Kitchen warfarin (COUMADIN) 5 MG tablet Take 5-7.5 mg by mouth See admin instructions. Take 1.5 tablets on Monday and Friday. Take 1 tablet all other days.       No current facility-administered medications for this visit.    Allergies  Allergen Reactions  . Oxycodone-Acetaminophen Shortness Of Breath and Nausea Only  . Propoxyphene N-Acetaminophen Shortness Of Breath and Nausea Only  . Sulfonamide Derivatives Shortness Of Breath and Nausea Only  . Codeine     REACTION: nausea  . Latex Rash  . Metoprolol Rash    History   Social History  . Marital Status: Divorced    Spouse Name: N/A    Number of Children: 1  . Years of Education: N/A   Occupational History  . DISABILITY    Social History Main Topics  . Smoking status: Never Smoker   . Smokeless tobacco: Not on file  . Alcohol Use: No  . Drug Use: No  . Sexual Activity: Not  on file   Other Topics Concern  . Not on file   Social History Narrative   Retired   Regular exercise-no     Review of Systems: General: negative for chills, fever, night sweats or weight changes.  Cardiovascular: negative for chest pain, dyspnea on exertion, edema, orthopnea, palpitations, paroxysmal nocturnal dyspnea or shortness of breath Dermatological: negative for rash Respiratory: negative for cough or wheezing Urologic: negative for hematuria Abdominal: negative for nausea, vomiting, diarrhea, bright red blood per rectum, melena, or hematemesis Neurologic: negative for visual changes, syncope, or dizziness All other systems reviewed and are otherwise negative except as noted above.    Blood pressure 148/92, pulse 98, height 5\' 4"  (1.626 m), weight 182 lb  (82.555 kg).  General appearance: alert and no distress Neck: no adenopathy, no carotid bruit, no JVD, supple, symmetrical, trachea midline and thyroid not enlarged, symmetric, no tenderness/mass/nodules Lungs: clear to auscultation bilaterally Heart: regular rate and rhythm, S1, S2 normal, no murmur, click, rub or gallop Extremities: mild periph edema  EKG NSR 98 , LAFB  ASSESSMENT AND PLAN:   HYPERTENSION Mildly elevated today on prepping medications. She is on Cardizem CD 180. She was referred for rapid heartbeat during rehabilitation. I'm going to increase this to 240 mg a day and obtain any event monitor.  HYPERLIPIDEMIA On statin therapy followed by her PCP  Palpitations The patient was referred for "rapid heartbeat" during physical activity especially noted during rehabilitation therapy sessions. There is no history of atrial fibrillation. She is on Coumadin because of prior strokes. She says she feels palpitations. I suspect the mildly elevated heart rate is related to deconditioning. I'm going to obtain an event monitor and increase her Cardizem CD from 180-240 mg daily.      Connie Harp MD FACP,FACC,FAHA, [facility] 08/31/2013 11:56 AM

## 2013-08-31 NOTE — Patient Instructions (Signed)
  We will see you back in follow up after the tests  Dr Gwenlyn Found has ordered : Your physician has recommended that you wear an event monitor for 2 weeks. Event monitors are medical devices that record the heart's electrical activity. Doctors most often [place] these monitors to diagnose arrhythmias. Arrhythmias are problems with the speed or rhythm of the heartbeat. The monitor is a small, portable device. You can wear one while you do your normal daily activities. This is usually used to diagnose what is causing palpitations/syncope (passing out).  Dr Gwenlyn Found wants to increase your diltiazem to 240mg  daily.

## 2013-09-04 ENCOUNTER — Encounter (INDEPENDENT_AMBULATORY_CARE_PROVIDER_SITE_OTHER): Payer: Self-pay | Admitting: Surgery

## 2013-09-13 ENCOUNTER — Encounter: Payer: Self-pay | Admitting: *Deleted

## 2013-09-24 ENCOUNTER — Encounter: Payer: Self-pay | Admitting: *Deleted

## 2013-09-24 ENCOUNTER — Ambulatory Visit: Payer: PRIVATE HEALTH INSURANCE | Admitting: Cardiovascular Disease

## 2013-10-04 ENCOUNTER — Telehealth: Payer: Self-pay | Admitting: Endocrinology

## 2013-10-04 NOTE — Telephone Encounter (Signed)
What is the status of the form for the pt

## 2013-10-04 NOTE — Telephone Encounter (Signed)
Requested call back.  

## 2013-10-16 ENCOUNTER — Ambulatory Visit: Payer: PRIVATE HEALTH INSURANCE | Admitting: Endocrinology

## 2013-10-17 ENCOUNTER — Ambulatory Visit: Payer: PRIVATE HEALTH INSURANCE | Admitting: Endocrinology

## 2013-10-22 ENCOUNTER — Ambulatory Visit (INDEPENDENT_AMBULATORY_CARE_PROVIDER_SITE_OTHER): Payer: PRIVATE HEALTH INSURANCE | Admitting: Endocrinology

## 2013-10-22 ENCOUNTER — Telehealth: Payer: Self-pay | Admitting: Endocrinology

## 2013-10-22 ENCOUNTER — Encounter: Payer: Self-pay | Admitting: Endocrinology

## 2013-10-22 VITALS — BP 130/90 | HR 75 | Temp 98.6°F | Ht 64.0 in | Wt 178.0 lb

## 2013-10-22 DIAGNOSIS — E1129 Type 2 diabetes mellitus with other diabetic kidney complication: Secondary | ICD-10-CM

## 2013-10-22 DIAGNOSIS — M810 Age-related osteoporosis without current pathological fracture: Secondary | ICD-10-CM

## 2013-10-22 DIAGNOSIS — N6320 Unspecified lump in the left breast, unspecified quadrant: Secondary | ICD-10-CM

## 2013-10-22 DIAGNOSIS — N63 Unspecified lump in unspecified breast: Secondary | ICD-10-CM

## 2013-10-22 DIAGNOSIS — E1165 Type 2 diabetes mellitus with hyperglycemia: Principal | ICD-10-CM

## 2013-10-22 DIAGNOSIS — E785 Hyperlipidemia, unspecified: Secondary | ICD-10-CM

## 2013-10-22 DIAGNOSIS — Z0000 Encounter for general adult medical examination without abnormal findings: Secondary | ICD-10-CM

## 2013-10-22 DIAGNOSIS — N259 Disorder resulting from impaired renal tubular function, unspecified: Secondary | ICD-10-CM

## 2013-10-22 DIAGNOSIS — Z79899 Other long term (current) drug therapy: Secondary | ICD-10-CM

## 2013-10-22 MED ORDER — CEPHALEXIN 500 MG PO CAPS: 500.0000 mg | ORAL_CAPSULE | Freq: Three times a day (TID) | ORAL | Status: DC

## 2013-10-22 NOTE — Patient Instructions (Signed)
Please recheck the ultrasound of the left breast.  you will receive a phone call, about a day and time for an appointment. You probably have a small infection at the right breast.  i have sent a prescription to your pharmacy, for an antibiotic pill. blood tests are being requested for you today.  We'll contact you with results. Please come in soon for a regular physical.

## 2013-10-22 NOTE — Progress Notes (Signed)
Subjective:    Patient ID: Connie Ruiz, female    DOB: June 14, 1960, 53 y.o.   MRN: [id number]  HPI Pt states 1 week of slight nodule at the right breast, and assoc pain. Past Medical History  Diagnosis Date  . THYROID NODULE, LEFT 04/10/2009  . DIABETES MELLITUS, TYPE II 08/21/2007  . HYPERLIPIDEMIA 08/21/2007  . GOUT 08/21/2007  . HYPERTENSION 08/21/2007    Dr. Andree Elk, [place] 08/21/2007  . CEREBROVASCULAR ACCIDENT, ACUTE 04/15/2010  . GERD 08/21/2007  . RENAL INSUFFICIENCY 08/21/2007  . LUPUS 08/21/2007  . OSTEOPOROSIS 08/21/2007    Rheumatol at [facility]  . DVT, HX OF 08/21/2007  . CLOSTRIDIUM DIFFICILE COLITIS, HX OF 08/21/2007  . KIDNEY TRANSPLANTATION, HX OF 08/22/2007    s/p renal transplant-Dr. Andree Elk, [facility]  . Pulmonary embolism 07/16/2010  . Renal failure   . Current use of long term anticoagulation     Dr. Andree Elk, [facility]  . Depression     Dr. Andree Elk, [facility]  . History of stroke with residual effects   . Right sided weakness   . CVA 04/17/2010  . Tachycardia     Past Surgical History  Procedure Laterality Date  . Cholecystectomy    . Tubal ligation    . Kidney transplant Right 2009  . Renal biopsy, open  1981    History   Social History  . Marital Status: Divorced    Spouse Name: N/A    Number of Children: 1  . Years of Education: N/A   Occupational History  . DISABILITY    Social History Main Topics  . Smoking status: Never Smoker   . Smokeless tobacco: Not on file  . Alcohol Use: No  . Drug Use: No  . Sexual Activity: Not on file   Other Topics Concern  . Not on file   Social History Narrative   Retired   Regular exercise-no    Current Outpatient Prescriptions on File Prior to Visit  Medication Sig Dispense Refill  . atorvastatin (LIPITOR) 40 MG tablet Take 1 tablet (40 mg total) by mouth daily.  90 tablet  3  . calcitRIOL (ROCALTROL) 0.25 MCG capsule Take 0.25 mcg by mouth daily.      Marland Kitchen diltiazem (TIAZAC) 240 MG 24 hr  capsule Take 1 capsule (240 mg total) by mouth daily.  30 capsule  11  . esomeprazole (NEXIUM) 20 MG capsule Take 20 mg by mouth daily at 12 noon.      . folic acid (FOLVITE) 1 MG tablet Take 1 mg by mouth daily.      . Incontinence Supplies MISC "pull-up" type, 6 per day  200 each  11  . losartan (COZAAR) 50 MG tablet Take 50 mg by mouth daily.      . mycophenolate (CELLCEPT) 250 MG capsule Take 250 mg by mouth 2 (two) times daily.      Marland Kitchen nystatin (MYCOSTATIN) powder Apply topically 4 (four) times daily.      . predniSONE (DELTASONE) 1 MG tablet Take 1 tablet (1 mg total) by mouth daily with breakfast.      . senna-docusate (SENOKOT-S) 8.6-50 MG per tablet Take 1 tablet by mouth at bedtime as needed for mild constipation.      . sertraline (ZOLOFT) 100 MG tablet Take 100 mg by mouth daily.      . solifenacin (VESICARE) 10 MG tablet Take by mouth daily.      . tacrolimus (PROGRAF) 0.5 MG capsule Take 0.5  mg by mouth 2 (two) times daily.      Marland Kitchen warfarin (COUMADIN) 5 MG tablet Take 5-7.5 mg by mouth See admin instructions. Take 1.5 tablets on Monday and Friday. Take 1 tablet all other days.       No current facility-administered medications on file prior to visit.    Allergies  Allergen Reactions  . Oxycodone-Acetaminophen Shortness Of Breath and Nausea Only  . Propoxyphene N-Acetaminophen Shortness Of Breath and Nausea Only  . Sulfonamide Derivatives Shortness Of Breath and Nausea Only  . Codeine     REACTION: nausea  . Latex Rash  . Metoprolol Rash    Family History  Problem Relation Age of Onset  . Cancer Neg Hx   . Heart attack Mother   . Heart disease Father     BP 130/90  Pulse 75  Temp(Src) 98.6 F (37 C) (Oral)  Ht 5\' 4"  (1.626 m)  Wt 178 lb (80.74 kg)  BMI 30.54 kg/m2  Review of Systems Denies fever and d/c.    Objective:   Physical Exam VITAL SIGNS:  See vs page GENERAL: no distress Right breast: 2 cm tender nodule at 2 o'clock.  Lab Results  Component  Value Date   WBC 7.6 10/22/2013   HGB 13.4 10/22/2013   HCT 40.2 10/22/2013   PLT 235.0 10/22/2013   GLUCOSE 134* 10/22/2013   CHOL 277* 10/22/2013   TRIG 126.0 10/22/2013   HDL 54.00 10/22/2013   LDLCALC 198* 10/22/2013   ALT 18 10/22/2013   AST 26 10/22/2013   NA 137 10/22/2013   K 3.4* 10/22/2013   CL 105 10/22/2013   CREATININE 0.7 10/22/2013   BUN 11 10/22/2013   CO2 22 10/22/2013   TSH 0.37 10/22/2013   INR 3.5 08/23/2013   HGBA1C 6.0 10/22/2013   MICROALBUR 8.3* 10/22/2013  (i reviewed outside left breast US report)    Assessment & Plan:  Right breast nodule, new ? Mastitis Left breast nodule: she needs f/u US Dyslipidemia: ? Compliance with lipitor   Patient Instructions  Please recheck the ultrasound of the left breast.  you will receive a phone call, about a day and time for an appointment. You probably have a small infection at the right breast.  i have sent a prescription to your pharmacy, for an antibiotic pill. blood tests are being requested for you today.  We'll contact you with results. Please come in soon for a regular physical.

## 2013-10-23 LAB — CBC WITH DIFFERENTIAL/PLATELET
Basophils Absolute: 0 10*3/uL (ref 0.0–0.1)
Basophils Relative: 0.4 % (ref 0.0–3.0)
Eosinophils Absolute: 0 10*3/uL (ref 0.0–0.7)
Eosinophils Relative: 0.6 % (ref 0.0–5.0)
HCT: 40.2 % (ref 36.0–46.0)
Hemoglobin: 13.4 g/dL (ref 12.0–15.0)
Lymphocytes Relative: 36.5 % (ref 12.0–46.0)
Lymphs Abs: 2.8 10*3/uL (ref 0.7–4.0)
MCHC: 33.2 g/dL (ref 30.0–36.0)
MCV: 87.1 fl (ref 78.0–100.0)
Monocytes Absolute: 0.3 10*3/uL (ref 0.1–1.0)
Monocytes Relative: 4 % (ref 3.0–12.0)
Neutro Abs: 4.5 10*3/uL (ref 1.4–7.7)
Neutrophils Relative %: 58.5 % (ref 43.0–77.0)
Platelets: 235 10*3/uL (ref 150.0–400.0)
RBC: 4.62 Mil/uL (ref 3.87–5.11)
RDW: 14.6 % (ref 11.5–15.5)
WBC: 7.6 10*3/uL (ref 4.0–10.5)

## 2013-10-23 LAB — BASIC METABOLIC PANEL
BUN: 11 mg/dL (ref 6–23)
CO2: 22 mEq/L (ref 19–32)
Calcium: 9.3 mg/dL (ref 8.4–10.5)
Chloride: 105 mEq/L (ref 96–112)
Creatinine, Ser: 0.7 mg/dL (ref 0.4–1.2)
GFR: 120.46 mL/min (ref >60.00)
Glucose, Bld: 134 mg/dL — ABNORMAL HIGH (ref 70–99)
Potassium: 3.4 mEq/L — ABNORMAL LOW (ref 3.5–5.1)
Sodium: 137 mEq/L (ref 135–145)

## 2013-10-23 LAB — URINALYSIS, ROUTINE W REFLEX MICROSCOPIC
Bilirubin Urine: NEGATIVE
Ketones, ur: NEGATIVE
Nitrite: NEGATIVE
Specific Gravity, Urine: >=1.03 — AB (ref 1.000–1.030)
Total Protein, Urine: 30 — AB
Urine Glucose: NEGATIVE
Urobilinogen, UA: 1 (ref 0.0–1.0)
pH: 6 (ref 5.0–8.0)

## 2013-10-23 LAB — LIPID PANEL
Cholesterol: 277 mg/dL — ABNORMAL HIGH (ref 0–200)
HDL: 54 mg/dL (ref >39.00)
LDL Cholesterol: 198 mg/dL — ABNORMAL HIGH (ref 0–99)
Total CHOL/HDL Ratio: 5
Triglycerides: 126 mg/dL (ref 0.0–149.0)
VLDL: 25.2 mg/dL (ref 0.0–40.0)

## 2013-10-23 LAB — HEPATIC FUNCTION PANEL
ALT: 18 U/L (ref 0–35)
AST: 26 U/L (ref 0–37)
Albumin: 3.7 g/dL (ref 3.5–5.2)
Alkaline Phosphatase: 74 U/L (ref 39–117)
Bilirubin, Direct: 0.1 mg/dL (ref 0.0–0.3)
Total Bilirubin: 0.7 mg/dL (ref 0.2–1.2)
Total Protein: 7.5 g/dL (ref 6.0–8.3)

## 2013-10-23 LAB — URIC ACID: Uric Acid, Serum: 3.2 mg/dL (ref 2.4–7.0)

## 2013-10-23 LAB — MICROALBUMIN / CREATININE URINE RATIO
Creatinine,U: 292.7 mg/dL
Microalb Creat Ratio: 2.8 mg/g (ref 0.0–30.0)
Microalb, Ur: 8.3 mg/dL — ABNORMAL HIGH (ref 0.0–1.9)

## 2013-10-23 LAB — HEMOGLOBIN A1C: Hgb A1c MFr Bld: 6 % (ref 4.6–6.5)

## 2013-10-23 LAB — TSH: TSH: 0.37 u[IU]/mL (ref 0.35–4.50)

## 2013-10-24 LAB — PTH, INTACT AND CALCIUM
Calcium: 9.4 mg/dL (ref 8.4–10.5)
PTH: 84.4 pg/mL — ABNORMAL HIGH (ref 14.0–72.0)

## 2013-10-30 NOTE — Telephone Encounter (Signed)
Made in error

## 2013-10-31 ENCOUNTER — Other Ambulatory Visit: Payer: Self-pay | Admitting: Endocrinology

## 2013-11-06 ENCOUNTER — Ambulatory Visit (INDEPENDENT_AMBULATORY_CARE_PROVIDER_SITE_OTHER): Payer: PRIVATE HEALTH INSURANCE | Admitting: Pharmacist Clinician (PhC)/ Clinical Pharmacy Specialist

## 2013-11-06 DIAGNOSIS — I6350 Cerebral infarction due to unspecified occlusion or stenosis of unspecified cerebral artery: Secondary | ICD-10-CM

## 2013-11-06 DIAGNOSIS — I2699 Other pulmonary embolism without acute cor pulmonale: Secondary | ICD-10-CM

## 2013-11-06 LAB — POCT INR: INR: 3.5

## 2013-11-06 MED ORDER — WARFARIN SODIUM 5 MG PO TABS: ORAL_TABLET | ORAL | Status: DC

## 2013-11-15 ENCOUNTER — Telehealth: Payer: Self-pay

## 2013-11-15 MED ORDER — LOSARTAN POTASSIUM 100 MG PO TABS: 100.0000 mg | ORAL_TABLET | Freq: Every day | ORAL | Status: DC

## 2013-11-15 NOTE — Telephone Encounter (Signed)
Pt's daughter called sating when home health came to se pt her blood pressure was 170/100. Pt is taking 50mg  of Losartan for this. Home Health advised pt to call office to see about changing medication. Please advise, Thanks!

## 2013-11-15 NOTE — Telephone Encounter (Signed)
please call patient: Please increase losartan to 100 mg qd i have sent a prescription to your pharmacy Please advise pt to schedule cpx, next available.

## 2013-11-15 NOTE — Telephone Encounter (Signed)
Pt's daughter advised. Coming July 6th for appointment.

## 2013-12-10 ENCOUNTER — Encounter: Payer: PRIVATE HEALTH INSURANCE | Admitting: Endocrinology

## 2013-12-24 ENCOUNTER — Telehealth: Payer: Self-pay

## 2013-12-24 NOTE — Telephone Encounter (Signed)
LM with daughter to schedule CPE with PCP

## 2013-12-26 ENCOUNTER — Other Ambulatory Visit: Payer: Self-pay | Admitting: Endocrinology

## 2014-01-14 ENCOUNTER — Other Ambulatory Visit: Payer: Self-pay | Admitting: *Deleted

## 2014-01-14 ENCOUNTER — Other Ambulatory Visit: Payer: Self-pay | Admitting: Endocrinology

## 2014-01-14 MED ORDER — CALCITRIOL 0.25 MCG PO CAPS: 0.2500 ug | ORAL_CAPSULE | Freq: Every day | ORAL | Status: DC

## 2014-02-12 ENCOUNTER — Other Ambulatory Visit: Payer: Self-pay | Admitting: Endocrinology

## 2014-02-13 NOTE — Telephone Encounter (Signed)
Please advise if ok to refill medication is listed under historical provider.  Thanks!

## 2014-03-11 ENCOUNTER — Other Ambulatory Visit: Payer: Self-pay | Admitting: Cardiovascular Disease

## 2014-03-13 ENCOUNTER — Telehealth: Payer: Self-pay

## 2014-03-13 NOTE — Telephone Encounter (Signed)
Daughter stated that patient has not had an eye exam this year, but they plan to schedule it.

## 2014-04-05 ENCOUNTER — Ambulatory Visit (INDEPENDENT_AMBULATORY_CARE_PROVIDER_SITE_OTHER): Payer: PRIVATE HEALTH INSURANCE | Admitting: Endocrinology

## 2014-04-05 ENCOUNTER — Encounter: Payer: Self-pay | Admitting: Endocrinology

## 2014-04-05 ENCOUNTER — Other Ambulatory Visit: Payer: Self-pay

## 2014-04-05 ENCOUNTER — Encounter: Payer: PRIVATE HEALTH INSURANCE | Admitting: Endocrinology

## 2014-04-05 VITALS — BP 128/90 | HR 104 | Temp 98.1°F | Ht 64.0 in | Wt 181.0 lb

## 2014-04-05 DIAGNOSIS — Z0000 Encounter for general adult medical examination without abnormal findings: Secondary | ICD-10-CM

## 2014-04-05 DIAGNOSIS — Z0189 Encounter for other specified special examinations: Secondary | ICD-10-CM

## 2014-04-05 DIAGNOSIS — M609 Myositis, unspecified: Secondary | ICD-10-CM

## 2014-04-05 DIAGNOSIS — Z940 Kidney transplant status: Secondary | ICD-10-CM

## 2014-04-05 DIAGNOSIS — N189 Chronic kidney disease, unspecified: Secondary | ICD-10-CM

## 2014-04-05 DIAGNOSIS — N951 Menopausal and female climacteric states: Secondary | ICD-10-CM | POA: Insufficient documentation

## 2014-04-05 DIAGNOSIS — M791 Myalgia: Secondary | ICD-10-CM

## 2014-04-05 DIAGNOSIS — I6350 Cerebral infarction due to unspecified occlusion or stenosis of unspecified cerebral artery: Secondary | ICD-10-CM

## 2014-04-05 DIAGNOSIS — IMO0001 Reserved for inherently not codable concepts without codable children: Secondary | ICD-10-CM

## 2014-04-05 DIAGNOSIS — I639 Cerebral infarction, unspecified: Secondary | ICD-10-CM

## 2014-04-05 DIAGNOSIS — E1122 Type 2 diabetes mellitus with diabetic chronic kidney disease: Secondary | ICD-10-CM

## 2014-04-05 LAB — BASIC METABOLIC PANEL
BUN: 14 mg/dL (ref 6–23)
CO2: 26 mEq/L (ref 19–32)
Calcium: 9 mg/dL (ref 8.4–10.5)
Chloride: 103 mEq/L (ref 96–112)
Creatinine, Ser: 0.9 mg/dL (ref 0.4–1.2)
GFR: 87.43 mL/min (ref >60.00)
Glucose, Bld: 111 mg/dL — ABNORMAL HIGH (ref 70–99)
Potassium: 3.1 mEq/L — ABNORMAL LOW (ref 3.5–5.1)
Sodium: 136 mEq/L (ref 135–145)

## 2014-04-05 LAB — SEDIMENTATION RATE: Sed Rate: 48 mm/hr — ABNORMAL HIGH (ref 0–22)

## 2014-04-05 LAB — HEMOGLOBIN A1C: Hgb A1c MFr Bld: 6 % (ref 4.6–6.5)

## 2014-04-05 LAB — CK: Total CK: 54 U/L (ref 7–177)

## 2014-04-05 NOTE — Patient Instructions (Addendum)
Please go back to see a neurology specialist.  you will receive a phone call, about a day and time for an appointment. blood tests are being requested for you today.  We'll contact you with results. Please see the rheumatology specialist that Dr Kern Alberta has referred you to.   Please redo the BMET blood test one a month.   Please come back for a follow-up appointment in 3 months.

## 2014-04-05 NOTE — Progress Notes (Signed)
Subjective:    Patient ID: Connie Ruiz, female    DOB: 05-28-1961, 53 y.o.   MRN: [id number]  HPI Pt is here for regular wellness examination, and is feeling pretty well in general, and says chronic med probs are stable, except as noted below Past Medical History  Diagnosis Date  . THYROID NODULE, LEFT 04/10/2009  . DIABETES MELLITUS, TYPE II 08/21/2007  . HYPERLIPIDEMIA 08/21/2007  . GOUT 08/21/2007  . HYPERTENSION 08/21/2007    Dr. Andree Elk, [place] 08/21/2007  . CEREBROVASCULAR ACCIDENT, ACUTE 04/15/2010  . GERD 08/21/2007  . RENAL INSUFFICIENCY 08/21/2007  . LUPUS 08/21/2007  . OSTEOPOROSIS 08/21/2007    Rheumatol at [facility]  . DVT, HX OF 08/21/2007  . CLOSTRIDIUM DIFFICILE COLITIS, HX OF 08/21/2007  . KIDNEY TRANSPLANTATION, HX OF 08/22/2007    s/p renal transplant-Dr. Andree Elk, [facility]  . Pulmonary embolism 07/16/2010  . Renal failure   . Current use of long term anticoagulation     Dr. Andree Elk, [facility]  . Depression     Dr. Andree Elk, [facility]  . History of stroke with residual effects   . Right sided weakness   . CVA 04/17/2010  . Tachycardia     Past Surgical History  Procedure Laterality Date  . Cholecystectomy    . Tubal ligation    . Kidney transplant Right 2009  . Renal biopsy, open  1981    History   Social History  . Marital Status: Divorced    Spouse Name: N/A    Number of Children: 1  . Years of Education: N/A   Occupational History  . DISABILITY    Social History Main Topics  . Smoking status: Never Smoker   . Smokeless tobacco: Not on file  . Alcohol Use: No  . Drug Use: No  . Sexual Activity: Not on file   Other Topics Concern  . Not on file   Social History Narrative   Retired   Regular exercise-no    Current Outpatient Prescriptions on File Prior to Visit  Medication Sig Dispense Refill  . atorvastatin (LIPITOR) 40 MG tablet Take 1 tablet (40 mg total) by mouth daily. 90 tablet 3  . calcitRIOL (ROCALTROL) 0.25 MCG  capsule Take 1 capsule (0.25 mcg total) by mouth daily. 30 capsule 2  . diltiazem (TIAZAC) 240 MG 24 hr capsule Take 1 capsule (240 mg total) by mouth daily. 30 capsule 11  . esomeprazole (NEXIUM) 20 MG capsule TAKE ONE CAPSULE BY MOUTH EVERY MORNING BEFORE BREAKFAST 90 capsule 0  . folic acid (FOLVITE) 1 MG tablet Take 1 mg by mouth daily.    . Incontinence Supplies MISC "pull-up" type, 6 per day 200 each 11  . losartan (COZAAR) 100 MG tablet Take 1 tablet (100 mg total) by mouth daily. 30 tablet 11  . mycophenolate (CELLCEPT) 250 MG capsule Take 250 mg by mouth 2 (two) times daily.    Marland Kitchen nystatin (MYCOSTATIN) powder Apply topically 4 (four) times daily.    . predniSONE (DELTASONE) 1 MG tablet Take 1 tablet (1 mg total) by mouth daily with breakfast.    . senna-docusate (SENOKOT-S) 8.6-50 MG per tablet Take 1 tablet by mouth at bedtime as needed for mild constipation.    . sertraline (ZOLOFT) 100 MG tablet Take 100 mg by mouth daily.    . solifenacin (VESICARE) 10 MG tablet Take by mouth daily.    . tacrolimus (PROGRAF) 0.5 MG capsule Take 0.5 mg by mouth 2 (two) times  daily.    . warfarin (COUMADIN) 5 MG tablet Take 1 to 1.5 tablets by mouth daily as directed by coumadin clinic.  Needs INR for further medication. 20 tablet 0   No current facility-administered medications on file prior to visit.    Allergies  Allergen Reactions  . Oxycodone-Acetaminophen Shortness Of Breath and Nausea Only  . Propoxyphene N-Acetaminophen Shortness Of Breath and Nausea Only  . Sulfonamide Derivatives Shortness Of Breath and Nausea Only  . Codeine     REACTION: nausea  . Latex Rash  . Metoprolol Rash    Family History  Problem Relation Age of Onset  . Cancer Neg Hx   . Heart attack Mother   . Heart disease Father     BP 128/90 mmHg  Pulse 104  Temp(Src) 98.1 F (36.7 C) (Oral)  Ht 5\' 4"  (1.626 m)  Wt 181 lb (82.101 kg)  BMI 31.05 kg/m2  SpO2 95%     Review of Systems  Constitutional:        Weight gain  HENT: Negative for hearing loss.   Eyes: Negative for visual disturbance.  Respiratory: Negative for shortness of breath.   Cardiovascular: Negative for chest pain.  Gastrointestinal: Negative for anal bleeding.  Genitourinary: Negative for hematuria.  Musculoskeletal:       Gait is steady with a cane  Skin: Negative for rash.  Allergic/Immunologic: Negative for environmental allergies.  Neurological: Negative for syncope.  Hematological: Bruises/bleeds easily.  Psychiatric/Behavioral: Positive for dysphoric mood.       Objective:   Physical Exam VS: see vs page GEN: no distress HEAD: head: no deformity eyes: no periorbital swelling, no proptosis external nose and ears are normal mouth: no lesion seen NECK: supple, thyroid is not enlarged, but the 3 cm anterior nodule is again noted. CHEST WALL: no deformity; healed site of dialysis catheter.   LUNGS:  Clear to auscultation BREASTS: sees gyn CV: reg rate and rhythm, no murmur ABD: abdomen is soft, nontender.  no hepatosplenomegaly.  not distended.  no hernia GENITALIA/RECTAL: sees gyn MUSCULOSKELETAL: muscle bulk and strength are grossly normal.  no obvious joint swelling.  gait is steady with a cane.   EXTEMITIES: no deformity.  no ulcer on the feet.  feet are of normal color and temp.  no edema PULSES: dorsalis pedis intact bilat.  no carotid bruit NEURO:  cn 2-12 grossly intact, except for slow, slurred speech.   readily moves all 4's.  RUE and RLE are slightly weak.  sensation is intact to touch on the left foot.  It is decreased on RUE and RLE.  SKIN:  Normal texture and temperature.  No rash or suspicious lesion is visible.   NODES:  None palpable at the neck PSYCH: alert, well-oriented, but she struggles with orientation.  Does not appear anxious nor depressed.       Assessment & Plan:  Wellness visit today, with problems stable, except as noted. we discussed code status.  pt requests full code, but  would not want to be started or maintained on artificial life-support measures if there was not a reasonable chance of recovery.    SEPARATE EVALUATION FOLLOWS--EACH PROBLEM HERE IS NEW, NOT RESPONDING TO TREATMENT, OR POSES SIGNIFICANT RISK TO THE PATIENT'S HEALTH: HISTORY OF THE PRESENT ILLNESS:  Pt states of moderate pain throughout the body, and assoc insomnia.   PAST MEDICAL HISTORY reviewed and up to date today REVIEW OF SYSTEMS: Denies fever.  She has slight cold intolerance. PHYSICAL EXAMINATION: VITAL SIGNS:  See vs page GENERAL: no distress.   Muscles are diffusely tender to touch.   LAB/XRAY RESULTS: Lab Results  Component Value Date   ESRSEDRATE 48* 04/05/2014   Lab Results  Component Value Date   CKTOTAL 54 04/05/2014   Lab Results  Component Value Date   CREATININE 0.9 04/05/2014   BUN 14 04/05/2014   NA 136 04/05/2014   K 3.1* 04/05/2014   CL 103 04/05/2014   CO2 26 04/05/2014  IMPRESSION: myalgias, new, uncertain etiology.   Hypokalemia, persistent.   PLAN:  Please see the rheumatology specialist that Dr Kern Alberta has referred you to. i have sent a prescription to your pharmacy, for [place].

## 2014-04-06 LAB — TACROLIMUS LEVEL: Tacrolimus Lvl: 3.2 ng/mL — ABNORMAL LOW (ref 5.0–20.0)

## 2014-04-07 ENCOUNTER — Telehealth: Payer: Self-pay | Admitting: Endocrinology

## 2014-04-07 MED ORDER — POTASSIUM CHLORIDE ER 10 MEQ PO TBCR: 10.0000 meq | EXTENDED_RELEASE_TABLET | Freq: Every day | ORAL | Status: DC

## 2014-04-07 NOTE — Telephone Encounter (Signed)
Please fax 04/05/14 results to dr Donnetta Simpers at [place], thanks.

## 2014-04-08 NOTE — Telephone Encounter (Signed)
Tried to contact Dr. Donnetta Simpers office unable to reach at this time. Will try again.

## 2014-04-08 NOTE — Telephone Encounter (Signed)
Results faxed to Dr. Cleaster Corin office.

## 2014-04-16 ENCOUNTER — Other Ambulatory Visit: Payer: Self-pay | Admitting: Endocrinology

## 2014-04-16 ENCOUNTER — Inpatient Hospital Stay: Admission: RE | Admit: 2014-04-16 | Payer: PRIVATE HEALTH INSURANCE | Source: Ambulatory Visit

## 2014-04-16 DIAGNOSIS — N6320 Unspecified lump in the left breast, unspecified quadrant: Secondary | ICD-10-CM

## 2014-04-30 ENCOUNTER — Other Ambulatory Visit: Payer: Self-pay | Admitting: Endocrinology

## 2014-05-08 ENCOUNTER — Encounter: Payer: Self-pay | Admitting: Endocrinology

## 2014-05-09 ENCOUNTER — Other Ambulatory Visit: Payer: Self-pay | Admitting: Endocrinology

## 2014-05-09 DIAGNOSIS — N6320 Unspecified lump in the left breast, unspecified quadrant: Secondary | ICD-10-CM

## 2014-05-24 ENCOUNTER — Telehealth: Payer: Self-pay

## 2014-05-24 NOTE — Telephone Encounter (Signed)
Unable to reach pt to discuss flu shot. Number not connected.

## 2014-05-29 ENCOUNTER — Ambulatory Visit (INDEPENDENT_AMBULATORY_CARE_PROVIDER_SITE_OTHER): Payer: PRIVATE HEALTH INSURANCE | Admitting: Neurology

## 2014-05-29 ENCOUNTER — Encounter: Payer: Self-pay | Admitting: Neurology

## 2014-05-29 VITALS — BP 118/60 | HR 108 | Temp 98.6°F | Resp 20 | Ht 64.0 in | Wt 183.1 lb

## 2014-05-29 DIAGNOSIS — F32A Depression, unspecified: Secondary | ICD-10-CM

## 2014-05-29 DIAGNOSIS — I6350 Cerebral infarction due to unspecified occlusion or stenosis of unspecified cerebral artery: Secondary | ICD-10-CM

## 2014-05-29 DIAGNOSIS — F0150 Vascular dementia without behavioral disturbance: Secondary | ICD-10-CM

## 2014-05-29 DIAGNOSIS — F329 Major depressive disorder, single episode, unspecified: Secondary | ICD-10-CM

## 2014-05-29 DIAGNOSIS — N189 Chronic kidney disease, unspecified: Secondary | ICD-10-CM

## 2014-05-29 DIAGNOSIS — E1122 Type 2 diabetes mellitus with diabetic chronic kidney disease: Secondary | ICD-10-CM

## 2014-05-29 DIAGNOSIS — I639 Cerebral infarction, unspecified: Secondary | ICD-10-CM

## 2014-05-29 DIAGNOSIS — E785 Hyperlipidemia, unspecified: Secondary | ICD-10-CM

## 2014-05-29 MED ORDER — DONEPEZIL HCL 5 MG PO TABS: 5.0000 mg | ORAL_TABLET | Freq: Every day | ORAL | Status: DC

## 2014-05-29 NOTE — Patient Instructions (Signed)
1.  We will start donepezil (Aricept) 5mg  daily for four weeks.  Call in 4 weeks.  If you are tolerating the medication, then after four weeks, we will increase the dose to 10mg  daily.  Side effects include nausea, vomiting, diarrhea, vivid dreams, and muscle cramps.  Please call the clinic if you experience any of these symptoms. 2.  Continue zoloft for now.  Once we know you are tolerating the Aricept, we can switch zoloft to another antidepressant 3.  Take the Lipitor 4.  Mediterranean diet 5.  Need monitoring for medications 6.  Follow up in 6 months.

## 2014-05-29 NOTE — Progress Notes (Signed)
NEUROLOGY CONSULTATION NOTE  Connie Ruiz MRN: [id number] DOB: 1960-10-06  Referring provider: Dr. Loanne Drilling Primary care provider: Dr. 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  Records, labs and MRI reviewed.  She is accompanied by her daughter who provides some history.  She has history of significant cerebrovascular disease and stroke.  She last presented to the hospital on 06/14/13 with worsened left facial droop, right sided weakness and dysarthria.  MRI of the head showed remote left MCA infarct but no acute findings.  MRA of the head showed atherosclerotic changes in the anterior and posterior circulation with partial recanalization of the left MCA.  For at least 9 months, she has had some memory problems.  For example, she would often forget having just seen her children from time to time.  She also expresses that she feels people don't care about her.  She does report depression.  She still lives by herself.  She does not drive.  Since her stroke, she requires assistance in regards to her ADLs.  A home health aid helps her with dressing and bathing.  She has a pill box but she does not appear to be compliant with all of her medications, particularly the Lipitor.  Her daughter handles her finances.  She does not have problems recalling names or faces of people she knows (except once regarding a nephew in a picture).  04/05/14:  Hgb A1c 6.0 10/22/13:  cholesterol 277, TG 126, HDL 54, LDL 198 06/15/13 Carotid doppler:  1-39% bilateral ICA stenosis 06/15/13 2D Echo:  LVEF [id number], grade 1 diastolic dysfunction  She is followed for  her lupus by rheumatology at [place]: Past Medical History  Diagnosis Date  . THYROID NODULE, LEFT 04/10/2009  . DIABETES MELLITUS, TYPE II 08/21/2007  . HYPERLIPIDEMIA 08/21/2007  . GOUT 08/21/2007  . HYPERTENSION 08/21/2007    Dr. Andree Elk, [place] 08/21/2007  . CEREBROVASCULAR ACCIDENT, ACUTE 04/15/2010  . GERD 08/21/2007  . RENAL INSUFFICIENCY 08/21/2007  . LUPUS 08/21/2007  . OSTEOPOROSIS 08/21/2007    R[facility]umatol at [facility]  . DVT, HX OF 08/21/2007  . CLOSTRIDIUM DIFFICILE COLITIS, HX OF 08/21/2007  . KIDNEY TRANSPLANTATION, HX OF 08/22/2007    s/p renal transplant-Dr. Andree Elk, [facility]  . Pulmonary embolism 07/16/2010  . Renal failure   . Current use of long term anticoagulation     Dr. Andree Elk, [facility]  . Depression     Dr. Andree Elk, [facility]  . History of stroke with residual effects   . Right sided weakness   . CVA 04/17/2010  . Tachycardia     PAST SURGICAL HISTORY: Past Surgical History  Procedure Laterality Date  . Cholecystectomy    . Tubal ligation    . Kidney transplant Right 2009  . Renal biopsy, open  1981    MEDICATIONS: Current Outpatient Prescriptions on File Prior to Visit  Medication Sig Dispense Refill  . atorvastatin (LIPITOR) 40 MG tablet Take 1 tablet (40 mg total) by mouth daily. 90 tablet 3  . calcitRIOL (ROCALTROL) 0.25 MCG capsule TAKE ONE CAPSULE BY MOUTH DAILY 30 capsule 0  . diltiazem (TIAZAC) 240 MG 24 hr capsule Take  1 capsule (240 mg total) by mouth daily. 30 capsule 11  . esomeprazole (NEXIUM) 20 MG capsule TAKE ONE CAPSULE BY MOUTH EVERY MORNING BEFORE BREAKFAST 90 capsule 0  . folic acid (FOLVITE) 1 MG tablet Take 1 mg by mouth daily.    . Incontinence Supplies MISC "pull-up" type, 6 per day 200 each 11  . losartan (COZAAR) 100 MG tablet Take 1 tablet (100 mg total) by mouth daily. 30 tablet 11  . nystatin (MYCOSTATIN) powder Apply topically 4 (four) times daily.    . potassium chloride (K-DUR) 10 MEQ  tablet Take 1 tablet (10 mEq total) by mouth daily. 30 tablet 5  . predniSONE (DELTASONE) 1 MG tablet Take 1 tablet (1 mg total) by mouth daily with breakfast.    . senna-docusate (SENOKOT-S) 8.6-50 MG per tablet Take 1 tablet by mouth at bedtime as needed for mild constipation.    . sertraline (ZOLOFT) 100 MG tablet Take 100 mg by mouth daily.    . solifenacin (VESICARE) 10 MG tablet Take by mouth daily.    . tacrolimus (PROGRAF) 0.5 MG capsule Take 0.5 mg by mouth 2 (two) times daily.    Marland Kitchen warfarin (COUMADIN) 5 MG tablet Take 1 to 1.5 tablets by mouth daily as directed by coumadin clinic.  Needs INR for further medication. 20 tablet 0  . mycophenolate (CELLCEPT) 250 MG capsule Take 250 mg by mouth 2 (two) times daily.     No current facility-administered medications on file prior to visit.    ALLERGIES: Allergies  Allergen Reactions  . Oxycodone-Acetaminophen Shortness Of Breath and Nausea Only  . Propoxyphene N-Acetaminophen Shortness Of Breath and Nausea Only  . Sulfonamide Derivatives Shortness Of Breath and Nausea Only  . Codeine     REACTION: nausea  . Latex Rash  . Metoprolol Rash    FAMILY HISTORY: Family History  Problem Relation Age of Onset  . Heart attack Mother   . Heart disease Father   . Asthma Sister   . Asthma Sister   . Asthma Daughter   . Cancer Maternal Grandfather     prostate  . Cancer Paternal Grandfather     colon    SOCIAL HISTORY: History   Social History  . Marital Status: Divorced    Spouse Name: N/A    Number of Children: 1  . Years of Education: N/A   Occupational History  . DISABILITY    Social History Main Topics  . Smoking status: Never Smoker   . Smokeless tobacco: Never Used  . Alcohol Use: No  . Drug Use: No  . Sexual Activity: No   Other Topics Concern  . Not on file   Social History Narrative   Retired   Regular exercise-no    REVIEW OF SYSTEMS: Constitutional: No fevers, chills, or sweats, no generalized  fatigue, change in appetite Eyes: No visual changes, double vision, eye pain Ear, nose and throat: No hearing loss, ear pain, nasal congestion, sore throat Cardiovascular: No chest pain, palpitations Respiratory:  No shortness of breath at rest or with exertion, wheezes GastrointestinaI: No nausea, vomiting, diarrhea, abdominal pain, fecal incontinence Genitourinary:  No dysuria, urinary retention or frequency Musculoskeletal:  No neck pain, back pain Integumentary: No rash, pruritus, skin lesions Neurological: as above Psychiatric: No depression, insomnia, anxiety Endocrine: No palpitations, fatigue, diaphoresis, mood swings, change in appetite, change in weight, increased thirst Hematologic/Lymphatic:  No anemia, purpura, petechiae. Allergic/Immunologic: no itchy/runny eyes, nasal congestion, recent allergic reactions, rashes  PHYSICAL EXAM: Filed  Vitals:   05/29/14 0924  BP: 118/60  Pulse: 108  Temp: 98.6 F (37 C)  Resp: 20   General: No acute distress Head:  Normocephalic/atraumatic Eyes:  fundi not visualized on exam. Neck: supple, no paraspinal tenderness, full range of motion Back: No paraspinal tenderness Heart: regular rate and rhythm Lungs: Clear to auscultation bilaterally. Vascular: No carotid bruits. Neurological Exam: Mental status: alert and oriented to person, place, and time, delayed recall poor and remote memory intact, fund of knowledge intact, attention and concentration impaired, speech occasionally dysfluent but not dysarthric, Able to name but with some difficulty repeating or following some commands (possible mild apraxia as well).  Montreal Cognitive Assessment  05/29/2014  Visuospatial/ Executive (0/5) 3  Naming (0/3) 3  Attention: Read list of digits (0/2) 0  Attention: Read list of letters (0/1) 1  Attention: Serial 7 subtraction starting at 100 (0/3) 3  Language: Repeat phrase (0/2) 0  Language : Fluency (0/1) 0  Abstraction (0/2) 1  Delayed  Recall (0/5) 0  Orientation (0/6) 6  Total 17  Adjusted Score (based on education) 17    Cranial nerves: CN I: not tested CN II: pupils equal, round and reactive to light, visual fields intact, fundi not visualized. CN III, IV, VI:  full range of motion, no nystagmus, right ptosis CN V: reduced facial sensation on right (V1-V3) CN VII: Mild right lower facial weakness CN VIII: hearing intact CN IX, X: gag intact, uvula midline CN XI: sternocleidomastoid and trapezius muscles intact CN XII: tongue midline Bulk & Tone: normal, no fasciculations. Motor:  5-/5 right upper extremity and hip flexion.  Otherwise 5/5. Sensation:  Reduced pinprick and vibration sensation in the right arm and leg. Deep Tendon Reflexes:  2+ throughout, toes downgoing Finger to nose testing:  No dysmetria Gait:  Wide-based, waddling. Romberg with sway.  IMPRESSION: Vascular dementia Cerebrovascular disease Depression Type II diabetes mellitus Hyperlipidemia  PLAN: 1.  Will initiate Aricept 5mg  daily for 1 month and then increase to 10mg  daily if tolerating. 2.  Continue warfarin for secondary stroke prevention 3.  Advised to take the Lipitor 40mg  as LDL goal should be less than 70. 4.  Once she is stable on Aricept, will likely switch Zoloft to another antidepressant, such as citalpram. 5.  Follow up in 6 months.  Thank you for allowing me to take part in the care of this patient.  Metta Clines, DO  CC: Renato Shin, MD

## 2014-06-08 DIAGNOSIS — I6789 Other cerebrovascular disease: Secondary | ICD-10-CM | POA: Diagnosis not present

## 2014-06-09 DIAGNOSIS — I6789 Other cerebrovascular disease: Secondary | ICD-10-CM | POA: Diagnosis not present

## 2014-06-10 DIAGNOSIS — I6789 Other cerebrovascular disease: Secondary | ICD-10-CM | POA: Diagnosis not present

## 2014-06-11 DIAGNOSIS — I6789 Other cerebrovascular disease: Secondary | ICD-10-CM | POA: Diagnosis not present

## 2014-06-12 DIAGNOSIS — I6789 Other cerebrovascular disease: Secondary | ICD-10-CM | POA: Diagnosis not present

## 2014-06-13 DIAGNOSIS — I6789 Other cerebrovascular disease: Secondary | ICD-10-CM | POA: Diagnosis not present

## 2014-06-14 DIAGNOSIS — I6789 Other cerebrovascular disease: Secondary | ICD-10-CM | POA: Diagnosis not present

## 2014-06-15 DIAGNOSIS — I6789 Other cerebrovascular disease: Secondary | ICD-10-CM | POA: Diagnosis not present

## 2014-06-16 DIAGNOSIS — I6789 Other cerebrovascular disease: Secondary | ICD-10-CM | POA: Diagnosis not present

## 2014-06-17 DIAGNOSIS — I6789 Other cerebrovascular disease: Secondary | ICD-10-CM | POA: Diagnosis not present

## 2014-06-18 DIAGNOSIS — I6789 Other cerebrovascular disease: Secondary | ICD-10-CM | POA: Diagnosis not present

## 2014-06-19 DIAGNOSIS — I6789 Other cerebrovascular disease: Secondary | ICD-10-CM | POA: Diagnosis not present

## 2014-06-20 DIAGNOSIS — I6789 Other cerebrovascular disease: Secondary | ICD-10-CM | POA: Diagnosis not present

## 2014-06-21 DIAGNOSIS — M810 Age-related osteoporosis without current pathological fracture: Secondary | ICD-10-CM | POA: Diagnosis not present

## 2014-06-22 DIAGNOSIS — I6789 Other cerebrovascular disease: Secondary | ICD-10-CM | POA: Diagnosis not present

## 2014-06-23 DIAGNOSIS — I6789 Other cerebrovascular disease: Secondary | ICD-10-CM | POA: Diagnosis not present

## 2014-06-24 DIAGNOSIS — I6789 Other cerebrovascular disease: Secondary | ICD-10-CM | POA: Diagnosis not present

## 2014-06-25 DIAGNOSIS — I6789 Other cerebrovascular disease: Secondary | ICD-10-CM | POA: Diagnosis not present

## 2014-06-26 DIAGNOSIS — M810 Age-related osteoporosis without current pathological fracture: Secondary | ICD-10-CM | POA: Diagnosis not present

## 2014-06-26 DIAGNOSIS — M3500 Sicca syndrome, unspecified: Secondary | ICD-10-CM | POA: Diagnosis not present

## 2014-06-26 DIAGNOSIS — Z940 Kidney transplant status: Secondary | ICD-10-CM | POA: Diagnosis not present

## 2014-06-26 DIAGNOSIS — Z7952 Long term (current) use of systemic steroids: Secondary | ICD-10-CM | POA: Diagnosis not present

## 2014-06-26 DIAGNOSIS — M329 Systemic lupus erythematosus, unspecified: Secondary | ICD-10-CM | POA: Diagnosis not present

## 2014-06-26 DIAGNOSIS — Z79899 Other long term (current) drug therapy: Secondary | ICD-10-CM | POA: Diagnosis not present

## 2014-06-26 DIAGNOSIS — M2550 Pain in unspecified joint: Secondary | ICD-10-CM | POA: Diagnosis not present

## 2014-06-26 DIAGNOSIS — I6789 Other cerebrovascular disease: Secondary | ICD-10-CM | POA: Diagnosis not present

## 2014-06-27 DIAGNOSIS — I6789 Other cerebrovascular disease: Secondary | ICD-10-CM | POA: Diagnosis not present

## 2014-06-28 ENCOUNTER — Encounter (HOSPITAL_COMMUNITY): Payer: Self-pay | Admitting: Emergency Medicine

## 2014-06-28 ENCOUNTER — Emergency Department (HOSPITAL_COMMUNITY)
Admission: EM | Admit: 2014-06-28 | Discharge: 2014-06-28 | Disposition: A | Discharge status: Home / Self Care | Payer: Medicare Other | Attending: Emergency Medicine | Admitting: Emergency Medicine

## 2014-06-28 ENCOUNTER — Emergency Department (HOSPITAL_COMMUNITY): Payer: Medicare Other

## 2014-06-28 DIAGNOSIS — Z7952 Long term (current) use of systemic steroids: Secondary | ICD-10-CM | POA: Insufficient documentation

## 2014-06-28 DIAGNOSIS — R531 Weakness: Secondary | ICD-10-CM | POA: Diagnosis not present

## 2014-06-28 DIAGNOSIS — R197 Diarrhea, unspecified: Secondary | ICD-10-CM

## 2014-06-28 DIAGNOSIS — E119 Type 2 diabetes mellitus without complications: Secondary | ICD-10-CM | POA: Diagnosis not present

## 2014-06-28 DIAGNOSIS — Z91040 Latex allergy status: Secondary | ICD-10-CM | POA: Insufficient documentation

## 2014-06-28 DIAGNOSIS — Z79899 Other long term (current) drug therapy: Secondary | ICD-10-CM | POA: Insufficient documentation

## 2014-06-28 DIAGNOSIS — Z7901 Long term (current) use of anticoagulants: Secondary | ICD-10-CM | POA: Insufficient documentation

## 2014-06-28 DIAGNOSIS — Z86718 Personal history of other venous thrombosis and embolism: Secondary | ICD-10-CM | POA: Insufficient documentation

## 2014-06-28 DIAGNOSIS — Z8739 Personal history of other diseases of the musculoskeletal system and connective tissue: Secondary | ICD-10-CM | POA: Insufficient documentation

## 2014-06-28 DIAGNOSIS — I6789 Other cerebrovascular disease: Secondary | ICD-10-CM | POA: Diagnosis not present

## 2014-06-28 DIAGNOSIS — Z8659 Personal history of other mental and behavioral disorders: Secondary | ICD-10-CM | POA: Diagnosis not present

## 2014-06-28 DIAGNOSIS — Z86711 Personal history of pulmonary embolism: Secondary | ICD-10-CM | POA: Diagnosis not present

## 2014-06-28 DIAGNOSIS — R110 Nausea: Secondary | ICD-10-CM | POA: Insufficient documentation

## 2014-06-28 DIAGNOSIS — E876 Hypokalemia: Secondary | ICD-10-CM | POA: Diagnosis not present

## 2014-06-28 DIAGNOSIS — I509 Heart failure, unspecified: Secondary | ICD-10-CM | POA: Diagnosis not present

## 2014-06-28 DIAGNOSIS — J111 Influenza due to unidentified influenza virus with other respiratory manifestations: Secondary | ICD-10-CM | POA: Diagnosis not present

## 2014-06-28 DIAGNOSIS — I10 Essential (primary) hypertension: Secondary | ICD-10-CM | POA: Insufficient documentation

## 2014-06-28 DIAGNOSIS — E785 Hyperlipidemia, unspecified: Secondary | ICD-10-CM | POA: Diagnosis not present

## 2014-06-28 DIAGNOSIS — Z8673 Personal history of transient ischemic attack (TIA), and cerebral infarction without residual deficits: Secondary | ICD-10-CM | POA: Insufficient documentation

## 2014-06-28 DIAGNOSIS — K219 Gastro-esophageal reflux disease without esophagitis: Secondary | ICD-10-CM | POA: Diagnosis not present

## 2014-06-28 DIAGNOSIS — Z940 Kidney transplant status: Secondary | ICD-10-CM | POA: Diagnosis not present

## 2014-06-28 DIAGNOSIS — R509 Fever, unspecified: Secondary | ICD-10-CM | POA: Diagnosis not present

## 2014-06-28 DIAGNOSIS — Z87448 Personal history of other diseases of urinary system: Secondary | ICD-10-CM | POA: Diagnosis not present

## 2014-06-28 DIAGNOSIS — R404 Transient alteration of awareness: Secondary | ICD-10-CM | POA: Diagnosis not present

## 2014-06-28 LAB — CBC WITH DIFFERENTIAL/PLATELET
Basophils Absolute: 0 10*3/uL (ref 0.0–0.1)
Basophils Relative: 1 % (ref 0–1)
Eosinophils Absolute: 0 10*3/uL (ref 0.0–0.7)
Eosinophils Relative: 1 % (ref 0–5)
HCT: 40 % (ref 36.0–46.0)
Hemoglobin: 13.3 g/dL (ref 12.0–15.0)
Lymphocytes Relative: 43 % (ref 12–46)
Lymphs Abs: 2.8 10*3/uL (ref 0.7–4.0)
MCH: 29.6 pg (ref 26.0–34.0)
MCHC: 33.3 g/dL (ref 30.0–36.0)
MCV: 88.9 fL (ref 78.0–100.0)
Monocytes Absolute: 0.6 10*3/uL (ref 0.1–1.0)
Monocytes Relative: 10 % (ref 3–12)
Neutro Abs: 3 10*3/uL (ref 1.7–7.7)
Neutrophils Relative %: 45 % (ref 43–77)
Platelets: 190 10*3/uL (ref 150–400)
RBC: 4.5 MIL/uL (ref 3.87–5.11)
RDW: 13.3 % (ref 11.5–15.5)
WBC: 6.4 10*3/uL (ref 4.0–10.5)

## 2014-06-28 LAB — COMPREHENSIVE METABOLIC PANEL
ALT: 20 U/L (ref 0–35)
AST: 34 U/L (ref 0–37)
Albumin: 3.5 g/dL (ref 3.5–5.2)
Alkaline Phosphatase: 93 U/L (ref 39–117)
Anion gap: 7 (ref 5–15)
BUN: 13 mg/dL (ref 6–23)
CO2: 23 mmol/L (ref 19–32)
Calcium: 8.9 mg/dL (ref 8.4–10.5)
Chloride: 110 mmol/L (ref 96–112)
Creatinine, Ser: 0.6 mg/dL (ref 0.50–1.10)
GFR calc Af Amer: >90 mL/min (ref >90)
GFR calc non Af Amer: >90 mL/min (ref >90)
Glucose, Bld: 113 mg/dL — ABNORMAL HIGH (ref 70–99)
Potassium: 3.1 mmol/L — ABNORMAL LOW (ref 3.5–5.1)
Sodium: 140 mmol/L (ref 135–145)
Total Bilirubin: 0.4 mg/dL (ref 0.3–1.2)
Total Protein: 6.6 g/dL (ref 6.0–8.3)

## 2014-06-28 LAB — LIPASE, BLOOD: Lipase: 29 U/L (ref 11–59)

## 2014-06-28 LAB — CK: Total CK: 288 U/L — ABNORMAL HIGH (ref 7–177)

## 2014-06-28 MED ORDER — IOHEXOL 300 MG/ML  SOLN
100.0000 mL | Freq: Once | INTRAMUSCULAR | Status: AC | PRN
  Administered 2014-06-28: 100 mL via INTRAVENOUS

## 2014-06-28 MED ORDER — FENTANYL CITRATE 0.05 MG/ML IJ SOLN
50.0000 ug | Freq: Once | INTRAMUSCULAR | Status: AC
  Administered 2014-06-28: 50 ug via INTRAVENOUS
  Filled 2014-06-28: qty 2

## 2014-06-28 MED ORDER — ACETAMINOPHEN 325 MG PO TABS: 650.0000 mg | ORAL_TABLET | Freq: Once | ORAL | Status: DC

## 2014-06-28 MED ORDER — ACETAMINOPHEN 160 MG/5ML PO SOLN
650.0000 mg | Freq: Once | ORAL | Status: AC
  Administered 2014-06-28: 650 mg via ORAL
  Filled 2014-06-28: qty 20.3

## 2014-06-28 MED ORDER — IOHEXOL 300 MG/ML  SOLN
50.0000 mL | Freq: Once | INTRAMUSCULAR | Status: AC | PRN
  Administered 2014-06-28: 50 mL via ORAL

## 2014-06-28 MED ORDER — ONDANSETRON 4 MG PO TBDP: ORAL_TABLET | ORAL | Status: DC

## 2014-06-28 MED ORDER — SODIUM CHLORIDE 0.9 % IV BOLUS (SEPSIS)
500.0000 mL | Freq: Once | INTRAVENOUS | Status: AC
  Administered 2014-06-28: 500 mL via INTRAVENOUS

## 2014-06-28 MED ORDER — ONDANSETRON HCL 4 MG/2ML IJ SOLN
4.0000 mg | Freq: Once | INTRAMUSCULAR | Status: AC
  Administered 2014-06-28: 4 mg via INTRAVENOUS
  Filled 2014-06-28: qty 2

## 2014-06-28 MED ORDER — POTASSIUM CHLORIDE 20 MEQ/15ML (10%) PO SOLN
40.0000 meq | Freq: Once | ORAL | Status: AC
  Administered 2014-06-28: 40 meq via ORAL
  Filled 2014-06-28: qty 30

## 2014-06-28 NOTE — ED Notes (Signed)
Vital signs stable. 

## 2014-06-28 NOTE — ED Notes (Signed)
Bed: [id number] Expected date:  Expected time:  Means of arrival:  Comments: EMS- flu like symptoms

## 2014-06-28 NOTE — ED Notes (Signed)
From home via [facility], nausea, no vomiting, fever since last night, also c/o diarrhea and body aches, 4 mg Zofran, VSS, NAD

## 2014-06-28 NOTE — ED Notes (Addendum)
Loette Fauerbach, pt's daughter, [phone]

## 2014-06-28 NOTE — ED Notes (Signed)
Pt has diarrhea and i was unable to get a clean catch ur

## 2014-06-28 NOTE — ED Provider Notes (Signed)
CSN: [id number]     Arrival date & time 06/28/14  1022 History   First MD Initiated Contact with Patient 06/28/14 1024     Chief Complaint  Patient presents with  . Influenza     (Consider location/radiation/quality/duration/timing/severity/associated sxs/prior Treatment) HPI  54 year old female presents with chills, myalgias, and low-grade fever. Maximum temperature was 99. Also started having loose stools last night. Has continued to have some loose stools this morning. Has felt nauseated but no vomiting. Transient lower abdominal pain last night, no abdominal pain today or this morning. No cough, headache, shortness of breath, or chest pain. Has chronic back pain is not worse currently. Chronically has urinary frequency but states there is no new dysuria, or change in her urinary habits. No blood in her stools.  Past Medical History  Diagnosis Date  . THYROID NODULE, LEFT 04/10/2009  . DIABETES MELLITUS, TYPE II 08/21/2007  . HYPERLIPIDEMIA 08/21/2007  . GOUT 08/21/2007  . HYPERTENSION 08/21/2007    Dr. Andree Elk, [place] 08/21/2007  . CEREBROVASCULAR ACCIDENT, ACUTE 04/15/2010  . GERD 08/21/2007  . RENAL INSUFFICIENCY 08/21/2007  . LUPUS 08/21/2007  . OSTEOPOROSIS 08/21/2007    Rheumatol at [facility]  . DVT, HX OF 08/21/2007  . CLOSTRIDIUM DIFFICILE COLITIS, HX OF 08/21/2007  . KIDNEY TRANSPLANTATION, HX OF 08/22/2007    s/p renal transplant-Dr. Andree Elk, [facility]  . Pulmonary embolism 07/16/2010  . Renal failure   . Current use of long term anticoagulation     Dr. Andree Elk, [facility]  . Depression     Dr. Andree Elk, [facility]  . History of stroke with residual effects   . Right sided weakness   . CVA 04/17/2010  . Tachycardia    Past Surgical History  Procedure Laterality Date  . Cholecystectomy    . Tubal ligation    . Kidney transplant Right 2009  . Renal biopsy, open  1981   Family History  Problem Relation Age of Onset  . Heart attack Mother   . Heart disease  Father   . Asthma Sister   . Asthma Sister   . Asthma Daughter   . Cancer Maternal Grandfather     prostate  . Cancer Paternal Grandfather     colon   History  Substance Use Topics  . Smoking status: Never Smoker   . Smokeless tobacco: Never Used  . Alcohol Use: No   OB History    No data available     Review of Systems  Constitutional: Positive for fever and chills.  HENT: Negative for congestion.   Respiratory: Negative for cough and shortness of breath.   Gastrointestinal: Positive for nausea and diarrhea. Negative for vomiting, abdominal pain and blood in stool.  Genitourinary: Positive for frequency. Negative for dysuria.  Musculoskeletal: Positive for myalgias.  All other systems reviewed and are negative.     Allergies  Oxycodone-acetaminophen; Propoxyphene n-acetaminophen; Sulfonamide derivatives; Codeine; Latex; and Metoprolol  Home Medications   Prior to Admission medications   Medication Sig Start Date End Date Taking? Authorizing Provider  atorvastatin (LIPITOR) 40 MG tablet Take 1 tablet (40 mg total) by mouth daily. 08/10/13  Yes Renato Shin, MD  calcitRIOL (ROCALTROL) 0.25 MCG capsule TAKE ONE CAPSULE BY MOUTH DAILY 04/30/14  Yes Renato Shin, MD  diltiazem (TIAZAC) 240 MG 24 hr capsule Take 1 capsule (240 mg total) by mouth daily. 08/31/13  Yes Lorretta Harp, MD  donepezil (ARICEPT) 5 MG tablet Take 1 tablet (5 mg total) by mouth at  bedtime. 05/29/14  Yes Adam Melvern Sample, DO  folic acid (FOLVITE) 1 MG tablet Take 1 mg by mouth daily.   Yes Historical Provider, MD  Incontinence Supplies MISC "pull-up" type, 6 per day 08/10/13  Yes Renato Shin, MD  losartan (COZAAR) 100 MG tablet Take 1 tablet (100 mg total) by mouth daily. 11/15/13  Yes Renato Shin, MD  mycophenolate (CELLCEPT) 250 MG capsule Take 250 mg by mouth 2 (two) times daily.   Yes Historical Provider, MD  nystatin (MYCOSTATIN) powder Apply 1 g topically 4 (four) times daily.    Yes Historical  Provider, MD  potassium chloride (K-DUR) 10 MEQ tablet Take 1 tablet (10 mEq total) by mouth daily. 04/07/14  Yes Renato Shin, MD  predniSONE (DELTASONE) 1 MG tablet Take 1 tablet (1 mg total) by mouth daily with breakfast. 06/25/13  Yes Eugenie Filler, MD  sertraline (ZOLOFT) 100 MG tablet Take 100 mg by mouth daily.   Yes Historical Provider, MD  solifenacin (VESICARE) 10 MG tablet Take 10 mg by mouth daily.    Yes Historical Provider, MD  tacrolimus (PROGRAF) 1 MG capsule Take 1 mg by mouth 2 (two) times daily.  04/16/14  Yes Historical Provider, MD  warfarin (COUMADIN) 5 MG tablet Take 1 to 1.5 tablets by mouth daily as directed by coumadin clinic.  Needs INR for further medication. Patient taking differently: Take 5-7.5 mg by mouth at bedtime. Takes 5mg  everyday except 7.5mg  on mondays 03/11/14  Yes Lorretta Harp, MD  esomeprazole ([place]) 20 MG capsule TAKE ONE CAPSULE BY MOUTH EVERY MORNING BEFORE BREAKFAST Patient not taking: Reported on 06/28/2014 02/13/14   Renato Shin, MD  senna-docusate (SENOKOT-S) 8.6-50 MG per tablet Take 1 tablet by mouth at bedtime as needed for mild constipation. Patient not taking: Reported on 06/28/2014 06/19/13   Hosie Poisson, MD   BP 148/90 mmHg  Pulse 92  Temp(Src) 98.2 F (36.8 C) (Oral)  Resp 20  Ht 5\' 4"  (1.626 m)  Wt 184 lb (83.462 kg)  BMI 31.57 kg/m2  SpO2 99% Physical Exam  Constitutional: She is oriented to person, place, and time. She appears well-developed and well-nourished. No distress.  HENT:  Head: Normocephalic and atraumatic.  Right Ear: External ear normal.  Left Ear: External ear normal.  Nose: Nose normal.  Eyes: Right eye exhibits no discharge. Left eye exhibits no discharge.  Cardiovascular: Normal rate, regular rhythm and normal heart sounds.   Pulmonary/Chest: Effort normal and breath sounds normal. She has no wheezes. She has no rales.  Abdominal: Soft. She exhibits no distension. There is no tenderness.  Neurological:  She is alert and oriented to person, place, and time.  Skin: Skin is warm and dry. She is not diaphoretic.  Vitals reviewed.   ED Course  Procedures (including critical care time) Labs Review Labs Reviewed  COMPREHENSIVE METABOLIC PANEL - Abnormal; Notable for the following:    Potassium 3.1 (*)    Glucose, Bld 113 (*)    All other components within normal limits  CK - Abnormal; Notable for the following:    Total CK 288 (*)    All other components within normal limits  LIPASE, BLOOD  CBC WITH DIFFERENTIAL    Imaging Review Ct Abdomen Pelvis W Contrast  06/28/2014   CLINICAL DATA:  LEFT lower quadrant pain for 1 day, history kidney transplant 2009, diabetes, hypertension, hyperlipidemia, CHF, stroke, prior pulmonary embolism  EXAM: CT ABDOMEN AND PELVIS WITH CONTRAST  TECHNIQUE: Multidetector CT imaging of the  abdomen and pelvis was performed using the standard protocol following bolus administration of intravenous contrast. Sagittal and coronal MPR images reconstructed from axial data set.  CONTRAST:  142mL OMNIPAQUE IOHEXOL 300 MG/ML SOLN IV. Dilute oral contrast.  COMPARISON:  08/25/2011  FINDINGS: Mild dependent atelectasis at lung bases.  Post cholecystectomy.  Atrophic native kidneys with again identified cystic lesion of the RIGHT kidney 18 x 14 mm image 31 as well as a probable cyst of the LEFT kidney 28 x 16 mm image 33 unchanged.  Transplant kidney in RIGHT iliac fossa, normal in appearance.  Liver, spleen, pancreas, and adrenal glands normal.  Normal appendix.  Unremarkable uterus, ovaries, bladder, and ureters.  Few scattered colonic diverticula without evidence of diverticulitis.  Stomach and small bowel loops unremarkable.  No mass, adenopathy, free fluid, free air, or hernia.  Osseous structures unremarkable.  IMPRESSION: Minimal colonic diverticulosis.  Atrophic native kidneys with probable BILATERAL renal cysts.  Unremarkable transplant kidney in RIGHT iliac fossa.  No acute  intra-abdominal or intrapelvic abnormalities.   Electronically Signed   By: Lavonia Dana M.D.   On: 06/28/2014 15:01     EKG Interpretation None      MDM   Final diagnoses:  Diarrhea  Nausea  Hypokalemia    Patient with nausea without vomiting and diarrhea. No urinary symptoms or fevers here. Mild hypokalemia, repleted orally. Given history of rhabdo and myalgias, CK sent, is mildly elevated above max normal, not c/w rhabdo currently. No change in renal function. Hydrated, given anti-emetics. While in ED she complained of abd pain and now has focal LLQ tenderness.  CT shows no acute abnormalities. Will treat symptomatically, stable for discharge.     Ephraim Hamburger, MD 06/28/14 [phone]

## 2014-06-29 DIAGNOSIS — I6789 Other cerebrovascular disease: Secondary | ICD-10-CM | POA: Diagnosis not present

## 2014-06-30 DIAGNOSIS — I6789 Other cerebrovascular disease: Secondary | ICD-10-CM | POA: Diagnosis not present

## 2014-07-01 DIAGNOSIS — I6789 Other cerebrovascular disease: Secondary | ICD-10-CM | POA: Diagnosis not present

## 2014-07-02 DIAGNOSIS — I6789 Other cerebrovascular disease: Secondary | ICD-10-CM | POA: Diagnosis not present

## 2014-07-03 DIAGNOSIS — I6789 Other cerebrovascular disease: Secondary | ICD-10-CM | POA: Diagnosis not present

## 2014-07-04 DIAGNOSIS — I6789 Other cerebrovascular disease: Secondary | ICD-10-CM | POA: Diagnosis not present

## 2014-07-05 DIAGNOSIS — I6789 Other cerebrovascular disease: Secondary | ICD-10-CM | POA: Diagnosis not present

## 2014-07-06 DIAGNOSIS — I6789 Other cerebrovascular disease: Secondary | ICD-10-CM | POA: Diagnosis not present

## 2014-07-08 DIAGNOSIS — I6789 Other cerebrovascular disease: Secondary | ICD-10-CM | POA: Diagnosis not present

## 2014-07-09 DIAGNOSIS — I6789 Other cerebrovascular disease: Secondary | ICD-10-CM | POA: Diagnosis not present

## 2014-07-10 DIAGNOSIS — I6789 Other cerebrovascular disease: Secondary | ICD-10-CM | POA: Diagnosis not present

## 2014-07-11 DIAGNOSIS — I6789 Other cerebrovascular disease: Secondary | ICD-10-CM | POA: Diagnosis not present

## 2014-07-12 DIAGNOSIS — I6789 Other cerebrovascular disease: Secondary | ICD-10-CM | POA: Diagnosis not present

## 2014-07-13 DIAGNOSIS — I6789 Other cerebrovascular disease: Secondary | ICD-10-CM | POA: Diagnosis not present

## 2014-07-14 DIAGNOSIS — I6789 Other cerebrovascular disease: Secondary | ICD-10-CM | POA: Diagnosis not present

## 2014-07-15 DIAGNOSIS — I6789 Other cerebrovascular disease: Secondary | ICD-10-CM | POA: Diagnosis not present

## 2014-07-16 DIAGNOSIS — I6789 Other cerebrovascular disease: Secondary | ICD-10-CM | POA: Diagnosis not present

## 2014-07-17 ENCOUNTER — Other Ambulatory Visit: Payer: Self-pay | Admitting: Cardiovascular Disease

## 2014-07-17 DIAGNOSIS — I6789 Other cerebrovascular disease: Secondary | ICD-10-CM | POA: Diagnosis not present

## 2014-07-18 DIAGNOSIS — I6789 Other cerebrovascular disease: Secondary | ICD-10-CM | POA: Diagnosis not present

## 2014-07-18 NOTE — Telephone Encounter (Signed)
INR at [facility] Friday am

## 2014-07-19 ENCOUNTER — Ambulatory Visit (INDEPENDENT_AMBULATORY_CARE_PROVIDER_SITE_OTHER): Payer: Medicare Other | Admitting: *Deleted

## 2014-07-19 DIAGNOSIS — I2699 Other pulmonary embolism without acute cor pulmonale: Secondary | ICD-10-CM | POA: Diagnosis not present

## 2014-07-19 DIAGNOSIS — I639 Cerebral infarction, unspecified: Secondary | ICD-10-CM | POA: Diagnosis not present

## 2014-07-19 DIAGNOSIS — I6350 Cerebral infarction due to unspecified occlusion or stenosis of unspecified cerebral artery: Secondary | ICD-10-CM

## 2014-07-19 DIAGNOSIS — I6789 Other cerebrovascular disease: Secondary | ICD-10-CM | POA: Diagnosis not present

## 2014-07-19 LAB — POCT INR: INR: 3.8

## 2014-07-20 DIAGNOSIS — I6789 Other cerebrovascular disease: Secondary | ICD-10-CM | POA: Diagnosis not present

## 2014-07-21 DIAGNOSIS — I6789 Other cerebrovascular disease: Secondary | ICD-10-CM | POA: Diagnosis not present

## 2014-07-22 DIAGNOSIS — I6789 Other cerebrovascular disease: Secondary | ICD-10-CM | POA: Diagnosis not present

## 2014-07-23 DIAGNOSIS — I6789 Other cerebrovascular disease: Secondary | ICD-10-CM | POA: Diagnosis not present

## 2014-07-24 DIAGNOSIS — I6789 Other cerebrovascular disease: Secondary | ICD-10-CM | POA: Diagnosis not present

## 2014-07-25 DIAGNOSIS — I6789 Other cerebrovascular disease: Secondary | ICD-10-CM | POA: Diagnosis not present

## 2014-07-26 DIAGNOSIS — I6789 Other cerebrovascular disease: Secondary | ICD-10-CM | POA: Diagnosis not present

## 2014-07-27 DIAGNOSIS — I6789 Other cerebrovascular disease: Secondary | ICD-10-CM | POA: Diagnosis not present

## 2014-07-28 DIAGNOSIS — I6789 Other cerebrovascular disease: Secondary | ICD-10-CM | POA: Diagnosis not present

## 2014-07-29 DIAGNOSIS — I6789 Other cerebrovascular disease: Secondary | ICD-10-CM | POA: Diagnosis not present

## 2014-07-30 DIAGNOSIS — I6789 Other cerebrovascular disease: Secondary | ICD-10-CM | POA: Diagnosis not present

## 2014-07-31 DIAGNOSIS — I6789 Other cerebrovascular disease: Secondary | ICD-10-CM | POA: Diagnosis not present

## 2014-08-01 DIAGNOSIS — I6789 Other cerebrovascular disease: Secondary | ICD-10-CM | POA: Diagnosis not present

## 2014-08-02 DIAGNOSIS — I6789 Other cerebrovascular disease: Secondary | ICD-10-CM | POA: Diagnosis not present

## 2014-08-03 DIAGNOSIS — I6789 Other cerebrovascular disease: Secondary | ICD-10-CM | POA: Diagnosis not present

## 2014-08-04 DIAGNOSIS — I6789 Other cerebrovascular disease: Secondary | ICD-10-CM | POA: Diagnosis not present

## 2014-08-05 DIAGNOSIS — I6789 Other cerebrovascular disease: Secondary | ICD-10-CM | POA: Diagnosis not present

## 2014-08-06 ENCOUNTER — Ambulatory Visit (INDEPENDENT_AMBULATORY_CARE_PROVIDER_SITE_OTHER): Payer: Medicare Other

## 2014-08-06 DIAGNOSIS — I2699 Other pulmonary embolism without acute cor pulmonale: Secondary | ICD-10-CM

## 2014-08-06 DIAGNOSIS — I639 Cerebral infarction, unspecified: Secondary | ICD-10-CM | POA: Diagnosis not present

## 2014-08-06 DIAGNOSIS — I6350 Cerebral infarction due to unspecified occlusion or stenosis of unspecified cerebral artery: Secondary | ICD-10-CM

## 2014-08-06 DIAGNOSIS — I6789 Other cerebrovascular disease: Secondary | ICD-10-CM | POA: Diagnosis not present

## 2014-08-06 LAB — POCT INR: INR: 3

## 2014-08-07 DIAGNOSIS — I6789 Other cerebrovascular disease: Secondary | ICD-10-CM | POA: Diagnosis not present

## 2014-08-08 DIAGNOSIS — I6789 Other cerebrovascular disease: Secondary | ICD-10-CM | POA: Diagnosis not present

## 2014-08-09 DIAGNOSIS — I6789 Other cerebrovascular disease: Secondary | ICD-10-CM | POA: Diagnosis not present

## 2014-08-10 DIAGNOSIS — I6789 Other cerebrovascular disease: Secondary | ICD-10-CM | POA: Diagnosis not present

## 2014-08-11 DIAGNOSIS — I6789 Other cerebrovascular disease: Secondary | ICD-10-CM | POA: Diagnosis not present

## 2014-08-12 DIAGNOSIS — E785 Hyperlipidemia, unspecified: Secondary | ICD-10-CM | POA: Diagnosis not present

## 2014-08-12 DIAGNOSIS — Z8673 Personal history of transient ischemic attack (TIA), and cerebral infarction without residual deficits: Secondary | ICD-10-CM | POA: Diagnosis not present

## 2014-08-12 DIAGNOSIS — I120 Hypertensive chronic kidney disease with stage 5 chronic kidney disease or end stage renal disease: Secondary | ICD-10-CM | POA: Diagnosis not present

## 2014-08-12 DIAGNOSIS — Z940 Kidney transplant status: Secondary | ICD-10-CM | POA: Diagnosis not present

## 2014-08-12 DIAGNOSIS — N186 End stage renal disease: Secondary | ICD-10-CM | POA: Diagnosis not present

## 2014-08-12 DIAGNOSIS — M329 Systemic lupus erythematosus, unspecified: Secondary | ICD-10-CM | POA: Diagnosis not present

## 2014-08-12 DIAGNOSIS — Z7901 Long term (current) use of anticoagulants: Secondary | ICD-10-CM | POA: Diagnosis not present

## 2014-08-12 DIAGNOSIS — I6789 Other cerebrovascular disease: Secondary | ICD-10-CM | POA: Diagnosis not present

## 2014-08-12 DIAGNOSIS — Z79899 Other long term (current) drug therapy: Secondary | ICD-10-CM | POA: Diagnosis not present

## 2014-08-12 DIAGNOSIS — R079 Chest pain, unspecified: Secondary | ICD-10-CM | POA: Diagnosis not present

## 2014-08-12 DIAGNOSIS — E119 Type 2 diabetes mellitus without complications: Secondary | ICD-10-CM | POA: Diagnosis not present

## 2014-08-13 DIAGNOSIS — I6789 Other cerebrovascular disease: Secondary | ICD-10-CM | POA: Diagnosis not present

## 2014-08-14 DIAGNOSIS — I6789 Other cerebrovascular disease: Secondary | ICD-10-CM | POA: Diagnosis not present

## 2014-08-15 ENCOUNTER — Telehealth: Payer: Self-pay | Admitting: Endocrinology

## 2014-08-15 DIAGNOSIS — I6789 Other cerebrovascular disease: Secondary | ICD-10-CM | POA: Diagnosis not present

## 2014-08-15 NOTE — Telephone Encounter (Signed)
Malika from [employer] called to confirm if we received prescriptions for incontinance supplies  Call back at [phone]

## 2014-08-16 DIAGNOSIS — I6789 Other cerebrovascular disease: Secondary | ICD-10-CM | POA: Diagnosis not present

## 2014-08-17 DIAGNOSIS — I6789 Other cerebrovascular disease: Secondary | ICD-10-CM | POA: Diagnosis not present

## 2014-08-18 DIAGNOSIS — I6789 Other cerebrovascular disease: Secondary | ICD-10-CM | POA: Diagnosis not present

## 2014-08-19 DIAGNOSIS — I6789 Other cerebrovascular disease: Secondary | ICD-10-CM | POA: Diagnosis not present

## 2014-08-19 NOTE — Telephone Encounter (Signed)
Forms have been received, ov is due for form to be completed. Will contact pt to scheduled follow up.

## 2014-08-20 DIAGNOSIS — Z940 Kidney transplant status: Secondary | ICD-10-CM | POA: Diagnosis not present

## 2014-08-20 DIAGNOSIS — R079 Chest pain, unspecified: Secondary | ICD-10-CM | POA: Diagnosis not present

## 2014-08-20 DIAGNOSIS — I10 Essential (primary) hypertension: Secondary | ICD-10-CM | POA: Diagnosis not present

## 2014-08-20 DIAGNOSIS — E119 Type 2 diabetes mellitus without complications: Secondary | ICD-10-CM | POA: Diagnosis not present

## 2014-08-20 DIAGNOSIS — M329 Systemic lupus erythematosus, unspecified: Secondary | ICD-10-CM | POA: Diagnosis not present

## 2014-08-20 DIAGNOSIS — I6789 Other cerebrovascular disease: Secondary | ICD-10-CM | POA: Diagnosis not present

## 2014-08-21 ENCOUNTER — Other Ambulatory Visit: Payer: Self-pay | Admitting: *Deleted

## 2014-08-21 DIAGNOSIS — I6789 Other cerebrovascular disease: Secondary | ICD-10-CM | POA: Diagnosis not present

## 2014-08-21 NOTE — Telephone Encounter (Signed)
aricept 10 mg # 30 1 po QD with 2 refills called to Jenny Reichmann at [facility]

## 2014-08-22 DIAGNOSIS — I6789 Other cerebrovascular disease: Secondary | ICD-10-CM | POA: Diagnosis not present

## 2014-08-23 DIAGNOSIS — I6789 Other cerebrovascular disease: Secondary | ICD-10-CM | POA: Diagnosis not present

## 2014-08-24 DIAGNOSIS — I6789 Other cerebrovascular disease: Secondary | ICD-10-CM | POA: Diagnosis not present

## 2014-08-25 DIAGNOSIS — I6789 Other cerebrovascular disease: Secondary | ICD-10-CM | POA: Diagnosis not present

## 2014-08-26 DIAGNOSIS — M810 Age-related osteoporosis without current pathological fracture: Secondary | ICD-10-CM | POA: Diagnosis not present

## 2014-08-26 DIAGNOSIS — I6789 Other cerebrovascular disease: Secondary | ICD-10-CM | POA: Diagnosis not present

## 2014-08-26 DIAGNOSIS — E559 Vitamin D deficiency, unspecified: Secondary | ICD-10-CM | POA: Diagnosis not present

## 2014-08-26 DIAGNOSIS — Z940 Kidney transplant status: Secondary | ICD-10-CM | POA: Diagnosis not present

## 2014-08-26 DIAGNOSIS — M12532 Traumatic arthropathy, left wrist: Secondary | ICD-10-CM | POA: Diagnosis not present

## 2014-08-26 DIAGNOSIS — M329 Systemic lupus erythematosus, unspecified: Secondary | ICD-10-CM | POA: Diagnosis not present

## 2014-08-27 DIAGNOSIS — I6789 Other cerebrovascular disease: Secondary | ICD-10-CM | POA: Diagnosis not present

## 2014-08-28 DIAGNOSIS — I6789 Other cerebrovascular disease: Secondary | ICD-10-CM | POA: Diagnosis not present

## 2014-08-29 DIAGNOSIS — I6789 Other cerebrovascular disease: Secondary | ICD-10-CM | POA: Diagnosis not present

## 2014-08-30 DIAGNOSIS — I6789 Other cerebrovascular disease: Secondary | ICD-10-CM | POA: Diagnosis not present

## 2014-08-31 DIAGNOSIS — I6789 Other cerebrovascular disease: Secondary | ICD-10-CM | POA: Diagnosis not present

## 2014-09-01 DIAGNOSIS — I6789 Other cerebrovascular disease: Secondary | ICD-10-CM | POA: Diagnosis not present

## 2014-09-02 DIAGNOSIS — I6789 Other cerebrovascular disease: Secondary | ICD-10-CM | POA: Diagnosis not present

## 2014-09-03 DIAGNOSIS — I6789 Other cerebrovascular disease: Secondary | ICD-10-CM | POA: Diagnosis not present

## 2014-09-04 DIAGNOSIS — I6789 Other cerebrovascular disease: Secondary | ICD-10-CM | POA: Diagnosis not present

## 2014-09-05 DIAGNOSIS — I6789 Other cerebrovascular disease: Secondary | ICD-10-CM | POA: Diagnosis not present

## 2014-09-06 DIAGNOSIS — I6789 Other cerebrovascular disease: Secondary | ICD-10-CM | POA: Diagnosis not present

## 2014-09-07 DIAGNOSIS — I6789 Other cerebrovascular disease: Secondary | ICD-10-CM | POA: Diagnosis not present

## 2014-09-08 DIAGNOSIS — I6789 Other cerebrovascular disease: Secondary | ICD-10-CM | POA: Diagnosis not present

## 2014-09-09 DIAGNOSIS — I6789 Other cerebrovascular disease: Secondary | ICD-10-CM | POA: Diagnosis not present

## 2014-09-10 DIAGNOSIS — I6789 Other cerebrovascular disease: Secondary | ICD-10-CM | POA: Diagnosis not present

## 2014-09-11 DIAGNOSIS — I6789 Other cerebrovascular disease: Secondary | ICD-10-CM | POA: Diagnosis not present

## 2014-09-12 DIAGNOSIS — I6789 Other cerebrovascular disease: Secondary | ICD-10-CM | POA: Diagnosis not present

## 2014-09-13 DIAGNOSIS — I6789 Other cerebrovascular disease: Secondary | ICD-10-CM | POA: Diagnosis not present

## 2014-09-14 ENCOUNTER — Encounter (HOSPITAL_COMMUNITY): Payer: Self-pay | Admitting: Emergency Medicine

## 2014-09-14 ENCOUNTER — Emergency Department (HOSPITAL_COMMUNITY): Payer: Medicare Other

## 2014-09-14 ENCOUNTER — Emergency Department (HOSPITAL_COMMUNITY)
Admission: EM | Admit: 2014-09-14 | Discharge: 2014-09-14 | Disposition: A | Discharge status: Home / Self Care | Payer: Medicare Other | Attending: Emergency Medicine | Admitting: Emergency Medicine

## 2014-09-14 DIAGNOSIS — R51 Headache: Secondary | ICD-10-CM | POA: Insufficient documentation

## 2014-09-14 DIAGNOSIS — Z8659 Personal history of other mental and behavioral disorders: Secondary | ICD-10-CM | POA: Diagnosis not present

## 2014-09-14 DIAGNOSIS — E785 Hyperlipidemia, unspecified: Secondary | ICD-10-CM | POA: Diagnosis not present

## 2014-09-14 DIAGNOSIS — R519 Headache, unspecified: Secondary | ICD-10-CM

## 2014-09-14 DIAGNOSIS — Z8673 Personal history of transient ischemic attack (TIA), and cerebral infarction without residual deficits: Secondary | ICD-10-CM | POA: Insufficient documentation

## 2014-09-14 DIAGNOSIS — F4321 Adjustment disorder with depressed mood: Secondary | ICD-10-CM

## 2014-09-14 DIAGNOSIS — Z7901 Long term (current) use of anticoagulants: Secondary | ICD-10-CM | POA: Diagnosis not present

## 2014-09-14 DIAGNOSIS — Z86718 Personal history of other venous thrombosis and embolism: Secondary | ICD-10-CM | POA: Diagnosis not present

## 2014-09-14 DIAGNOSIS — I10 Essential (primary) hypertension: Secondary | ICD-10-CM | POA: Diagnosis not present

## 2014-09-14 DIAGNOSIS — R531 Weakness: Secondary | ICD-10-CM | POA: Diagnosis not present

## 2014-09-14 DIAGNOSIS — M810 Age-related osteoporosis without current pathological fracture: Secondary | ICD-10-CM | POA: Insufficient documentation

## 2014-09-14 DIAGNOSIS — Z79899 Other long term (current) drug therapy: Secondary | ICD-10-CM | POA: Insufficient documentation

## 2014-09-14 DIAGNOSIS — K219 Gastro-esophageal reflux disease without esophagitis: Secondary | ICD-10-CM | POA: Insufficient documentation

## 2014-09-14 DIAGNOSIS — R5381 Other malaise: Secondary | ICD-10-CM | POA: Insufficient documentation

## 2014-09-14 DIAGNOSIS — R404 Transient alteration of awareness: Secondary | ICD-10-CM | POA: Diagnosis not present

## 2014-09-14 DIAGNOSIS — Z7952 Long term (current) use of systemic steroids: Secondary | ICD-10-CM | POA: Insufficient documentation

## 2014-09-14 DIAGNOSIS — Z91040 Latex allergy status: Secondary | ICD-10-CM | POA: Insufficient documentation

## 2014-09-14 DIAGNOSIS — Z86711 Personal history of pulmonary embolism: Secondary | ICD-10-CM | POA: Diagnosis not present

## 2014-09-14 DIAGNOSIS — Z87448 Personal history of other diseases of urinary system: Secondary | ICD-10-CM | POA: Diagnosis not present

## 2014-09-14 DIAGNOSIS — E119 Type 2 diabetes mellitus without complications: Secondary | ICD-10-CM | POA: Diagnosis not present

## 2014-09-14 DIAGNOSIS — F4320 Adjustment disorder, unspecified: Secondary | ICD-10-CM | POA: Diagnosis not present

## 2014-09-14 DIAGNOSIS — I639 Cerebral infarction, unspecified: Secondary | ICD-10-CM | POA: Diagnosis not present

## 2014-09-14 DIAGNOSIS — Z940 Kidney transplant status: Secondary | ICD-10-CM | POA: Insufficient documentation

## 2014-09-14 DIAGNOSIS — R791 Abnormal coagulation profile: Secondary | ICD-10-CM | POA: Diagnosis not present

## 2014-09-14 DIAGNOSIS — I6789 Other cerebrovascular disease: Secondary | ICD-10-CM | POA: Diagnosis not present

## 2014-09-14 LAB — URINALYSIS, ROUTINE W REFLEX MICROSCOPIC
Bilirubin Urine: NEGATIVE
Glucose, UA: NEGATIVE mg/dL
Hgb urine dipstick: NEGATIVE
Ketones, ur: NEGATIVE mg/dL
Leukocytes, UA: NEGATIVE
Nitrite: NEGATIVE
Protein, ur: NEGATIVE mg/dL
Specific Gravity, Urine: 1.021 (ref 1.005–1.030)
Urobilinogen, UA: 0.2 mg/dL (ref 0.0–1.0)
pH: 6.5 (ref 5.0–8.0)

## 2014-09-14 LAB — COMPREHENSIVE METABOLIC PANEL
ALT: 15 U/L (ref 0–35)
AST: 17 U/L (ref 0–37)
Albumin: 3.5 g/dL (ref 3.5–5.2)
Alkaline Phosphatase: 89 U/L (ref 39–117)
Anion gap: 9 (ref 5–15)
BUN: 13 mg/dL (ref 6–23)
CO2: 22 mmol/L (ref 19–32)
Calcium: 8.8 mg/dL (ref 8.4–10.5)
Chloride: 109 mmol/L (ref 96–112)
Creatinine, Ser: 0.54 mg/dL (ref 0.50–1.10)
GFR calc Af Amer: >90 mL/min (ref >90)
GFR calc non Af Amer: >90 mL/min (ref >90)
Glucose, Bld: 155 mg/dL — ABNORMAL HIGH (ref 70–99)
Potassium: 3.3 mmol/L — ABNORMAL LOW (ref 3.5–5.1)
Sodium: 140 mmol/L (ref 135–145)
Total Bilirubin: 0.5 mg/dL (ref 0.3–1.2)
Total Protein: 6.9 g/dL (ref 6.0–8.3)

## 2014-09-14 LAB — CBC WITH DIFFERENTIAL/PLATELET
Basophils Absolute: 0.1 10*3/uL (ref 0.0–0.1)
Basophils Relative: 1 % (ref 0–1)
Eosinophils Absolute: 0.1 10*3/uL (ref 0.0–0.7)
Eosinophils Relative: 1 % (ref 0–5)
HCT: 39.2 % (ref 36.0–46.0)
Hemoglobin: 13.1 g/dL (ref 12.0–15.0)
Lymphocytes Relative: 53 % — ABNORMAL HIGH (ref 12–46)
Lymphs Abs: 4.3 10*3/uL — ABNORMAL HIGH (ref 0.7–4.0)
MCH: 30 pg (ref 26.0–34.0)
MCHC: 33.4 g/dL (ref 30.0–36.0)
MCV: 89.7 fL (ref 78.0–100.0)
Monocytes Absolute: 0.7 10*3/uL (ref 0.1–1.0)
Monocytes Relative: 8 % (ref 3–12)
Neutro Abs: 2.9 10*3/uL (ref 1.7–7.7)
Neutrophils Relative %: 37 % — ABNORMAL LOW (ref 43–77)
Platelets: 177 10*3/uL (ref 150–400)
RBC: 4.37 MIL/uL (ref 3.87–5.11)
RDW: 13.2 % (ref 11.5–15.5)
WBC: 8.1 10*3/uL (ref 4.0–10.5)

## 2014-09-14 LAB — PROTIME-INR
INR: 1.52 — ABNORMAL HIGH (ref 0.00–1.49)
Prothrombin Time: 18.4 seconds — ABNORMAL HIGH (ref 11.6–15.2)

## 2014-09-14 MED ORDER — WARFARIN SODIUM 5 MG PO TABS
5.0000 mg | ORAL_TABLET | Freq: Once | ORAL | Status: AC
  Administered 2014-09-14: 5 mg via ORAL
  Filled 2014-09-14: qty 1

## 2014-09-14 MED ORDER — MORPHINE SULFATE 4 MG/ML IJ SOLN
4.0000 mg | Freq: Once | INTRAMUSCULAR | Status: AC
  Administered 2014-09-14: 4 mg via INTRAVENOUS
  Filled 2014-09-14: qty 1

## 2014-09-14 MED ORDER — ONDANSETRON HCL 4 MG/2ML IJ SOLN
4.0000 mg | Freq: Once | INTRAMUSCULAR | Status: AC
  Administered 2014-09-14: 4 mg via INTRAVENOUS
  Filled 2014-09-14: qty 2

## 2014-09-14 MED ORDER — WARFARIN - PHYSICIAN DOSING INPATIENT: Freq: Every day | Status: DC

## 2014-09-14 NOTE — ED Notes (Signed)
Bed: [id number] Expected date:  Expected time:  Means of arrival:  Comments: EMS 40F Weakness/fever/chest contractions

## 2014-09-14 NOTE — ED Notes (Signed)
Pt comes from home via EMS.Pt reports weakness for a week after having cold. Pt also report low grade fever. Pt dealing with recent death in family.

## 2014-09-14 NOTE — Discharge Instructions (Signed)
Your workup today showed that your INR was 1.5.  You were given an additional tablet of Coumadin tonight.  Contact your Coumadin clinic on Monday for further advice on dosing and follow-up for recheck.  Return to the emergency department for worsening condition or new concerning symptoms.  Follow-up with your doctors for recheck this week.   General Headache Without Cause A general headache is pain or discomfort felt around the head or neck area. The cause may not be found.  HOME CARE   Keep all doctor visits.  Only take medicines as told by your doctor.  Lie down in a dark, quiet room when you have a headache.  Keep a journal to find out if certain things bring on headaches. For example, write down:  What you eat and drink.  How much sleep you get.  Any change to your diet or medicines.  Relax by getting a massage or doing other relaxing activities.  Put ice or heat packs on the head and neck area as told by your doctor.  Lessen stress.  Sit up straight. Do not tighten (tense) your muscles.  Quit smoking if you smoke.  Lessen how much alcohol you drink.  Lessen how much caffeine you drink, or stop drinking caffeine.  Eat and sleep on a regular schedule.  Get 7 to 9 hours of sleep, or as told by your doctor.  Keep lights dim if bright lights bother you or make your headaches worse. GET HELP RIGHT AWAY IF:   Your headache becomes really bad.  You have a fever.  You have a stiff neck.  You have trouble seeing.  Your muscles are weak, or you lose muscle control.  You lose your balance or have trouble walking.  You feel like you will pass out (faint), or you pass out.  You have really bad symptoms that are different than your first symptoms.  You have problems with the medicines given to you by your doctor.  Your medicines do not work.  Your headache feels different than the other headaches.  You feel sick to your stomach (nauseous) or throw up  (vomit). MAKE SURE YOU:   Understand these instructions.  Will watch your condition.  Will get help right away if you are not doing well or get worse. Document Released: 03/02/2008 Document Revised: 08/16/2011 Document Reviewed: 05/14/2011 [facility] Patient Information 2015 [place], [place]. This information is not intended to replace advice given to you by your health care provider. Make sure you discuss any questions you have with your health care provider.  Weakness Weakness is a lack of strength. You may feel weak all over your body or just in one part of your body. Weakness can be serious. In some cases, you may need more medical tests. HOME [place] a well-balanced diet.  Try to exercise every day.  Only take medicines as told by your doctor. GET HELP RIGHT AWAY IF:   You cannot do your normal daily activities.  You cannot walk up and down stairs, or you feel very tired when you do so.  You have shortness of breath or chest pain.  You have trouble moving parts of your body.  You have weakness in only one body part or on only one side of the body.  You have a fever.  You have trouble speaking or swallowing.  You cannot control when you pee (urinate) or poop (bowel movement).  You have black or bloody throw up (vomit) or poop.  Your weakness gets worse or spreads to other body parts.  You have new aches or pains. MAKE SURE YOU:   Understand these instructions.  Will watch your condition.  Will get help right away if you are not doing well or get worse. Document Released: 05/06/2008 Document Revised: 11/23/2011 Document Reviewed: 07/23/2011 [facility] Patient Information 2015 [place], [place]. This information is not intended to replace advice given to you by your health care provider. Make sure you discuss any questions you have with your health care provider.

## 2014-09-14 NOTE — ED Provider Notes (Signed)
CSN: [id number]     Arrival date & time 09/14/14  0153 History   First MD Initiated Contact with Patient 09/14/14 0157     Chief Complaint  Patient presents with  . Weakness     (Consider location/radiation/quality/duration/timing/severity/associated sxs/prior Treatment) HPI 54 year old female presents to emergency department from home with complaint, and worsening weakness.  Patient reports onset of headache tonight around 67 PM when talking to her daughter on the phone.  Patient has history of prior stroke with residual right-sided weakness.  She reports that she has been feeling warm and having a cough with worsening weakness since Sep 20, 2022 when her cousin died.  Patient has not taken her temperature.  She has complicated history of lupus, DVT, PE, CVA.  She reports she does not normally have headaches.  Headache is frontal and crampy and sharp in nature.    Past Medical History  Diagnosis Date  . THYROID NODULE, LEFT 04/10/2009  . DIABETES MELLITUS, TYPE II 08/21/2007  . HYPERLIPIDEMIA 08/21/2007  . GOUT 08/21/2007  . HYPERTENSION 08/21/2007    Dr. Andree Elk, [place] 08/21/2007  . CEREBROVASCULAR ACCIDENT, ACUTE 04/15/2010  . GERD 08/21/2007  . RENAL INSUFFICIENCY 08/21/2007  . LUPUS 08/21/2007  . OSTEOPOROSIS 08/21/2007    R[facility]umatol at [facility]  . DVT, HX OF 08/21/2007  . CLOSTRIDIUM DIFFICILE COLITIS, HX OF 08/21/2007  . KIDNEY TRANSPLANTATION, HX OF 08/22/2007    s/p renal transplant-Dr. Andree Elk, [facility]  . Pulmonary embolism 07/16/2010  . Renal failure   . Current use of long term anticoagulation     Dr. Andree Elk, [facility]  . Depression     Dr. Andree Elk, [facility]  . History of stroke with residual effects   . Right sided weakness   . CVA 04/17/2010  . Tachycardia    Past Surgical History  Procedure Laterality Date  . Cholecystectomy    . Tubal ligation    . Kidney transplant Right 2009  . Renal biopsy, open  1981   Family History  Problem Relation Age of  Onset  . Heart attack Mother   . Heart disease Father   . Asthma Sister   . Asthma Sister   . Asthma Daughter   . Cancer Maternal Grandfather     prostate  . Cancer Paternal Grandfather     colon   History  Substance Use Topics  . Smoking status: Never Smoker   . Smokeless tobacco: Never Used  . Alcohol Use: No   OB History    No data available     Review of Systems  Constitutional: Positive for fever.  Respiratory: Positive for cough.   Neurological: Positive for speech difficulty, weakness and headaches.      Allergies  Oxycodone-acetaminophen; Propoxyphene n-acetaminophen; Sulfonamide derivatives; Codeine; Latex; and Metoprolol  Home Medications   Prior to Admission medications   Medication Sig Start Date End Date Taking? Authorizing Provider  atorvastatin (LIPITOR) 40 MG tablet Take 1 tablet (40 mg total) by mouth daily. 08/10/13  Yes Renato Shin, MD  calcitRIOL (ROCALTROL) 0.25 MCG capsule TAKE ONE CAPSULE BY MOUTH DAILY 04/30/14  Yes Renato Shin, MD  diltiazem (TIAZAC) 240 MG 24 hr capsule Take 1 capsule (240 mg total) by mouth daily. 09/19/13  Yes Lorretta Harp, MD  donepezil (ARICEPT) 5 MG tablet Take 1 tablet (5 mg total) by mouth at bedtime. 05/29/14  Yes Pieter Partridge, DO  folic acid (FOLVITE) 1 MG tablet Take 1 mg by mouth daily.   Yes Historical  Provider, MD  losartan (COZAAR) 100 MG tablet Take 1 tablet (100 mg total) by mouth daily. 11/15/13  Yes Renato Shin, MD  mycophenolate (CELLCEPT) 250 MG capsule Take 250 mg by mouth 2 (two) times daily.   Yes Historical Provider, MD  ondansetron (ZOFRAN ODT) 4 MG disintegrating tablet 4mg  ODT q4 hours prn nausea/vomiting Patient taking differently: Take 4 mg by mouth every 8 (eight) hours as needed for nausea.  06/28/14  Yes Sherwood Gambler, MD  potassium chloride (K-DUR) 10 MEQ tablet Take 1 tablet (10 mEq total) by mouth daily. 04/07/14  Yes Renato Shin, MD  predniSONE (DELTASONE) 5 MG tablet Take 5 mg by mouth  daily with breakfast.   Yes Historical Provider, MD  sertraline (ZOLOFT) 100 MG tablet Take 100 mg by mouth daily.   Yes Historical Provider, MD  solifenacin (VESICARE) 10 MG tablet Take 10 mg by mouth daily.    Yes Historical Provider, MD  tacrolimus (PROGRAF) 1 MG capsule Take 1 mg by mouth 2 (two) times daily.  04/16/14  Yes Historical Provider, MD  warfarin (COUMADIN) 5 MG tablet Take 1 tablet (5 mg total) by mouth as directed. Patient taking differently: Take 5 mg by mouth one time only at 6 PM.  07/19/14  Yes Lorretta Harp, MD  esomeprazole ([place]) 20 MG capsule TAKE ONE CAPSULE BY MOUTH EVERY MORNING BEFORE BREAKFAST Patient not taking: Reported on 06/28/2014 02/13/14   Renato Shin, MD  Incontinence Supplies MISC "pull-up" type, 6 per day Patient not taking: Reported on 09/14/2014 08/10/13   Renato Shin, MD  nystatin (MYCOSTATIN) powder Apply 1 g topically 4 (four) times daily as needed. For yeast under breast    Historical Provider, MD  predniSONE (DELTASONE) 1 MG tablet Take 1 tablet (1 mg total) by mouth daily with breakfast. Patient not taking: Reported on 09/14/2014 06/25/13   Eugenie Filler, MD  senna-docusate (SENOKOT-S) 8.6-50 MG per tablet Take 1 tablet by mouth at bedtime as needed for mild constipation. Patient not taking: Reported on 06/28/2014 06/19/13   Hosie Poisson, MD   BP 136/87 mmHg  Pulse 78  Temp(Src) 97.8 F (36.6 C) (Oral)  Resp 15  SpO2 100% Physical Exam  Constitutional: She is oriented to person, place, and time. She appears well-developed and well-nourished.  HENT:  Head: Normocephalic and atraumatic.  Nose: Nose normal.  Mouth/Throat: Oropharynx is clear and moist.  Eyes: Conjunctivae and EOM are normal. Pupils are equal, round, and reactive to light.  Neck: Normal range of motion. Neck supple. No JVD present. No tracheal deviation present. No thyromegaly present.  Cardiovascular: Normal rate, regular rhythm, normal heart sounds and intact distal pulses.   Exam reveals no gallop and no friction rub.   No murmur heard. Pulmonary/Chest: Effort normal and breath sounds normal. No stridor. No respiratory distress. She has no wheezes. She has no rales. She exhibits no tenderness.  Abdominal: Soft. Bowel sounds are normal. She exhibits no distension and no mass. There is no tenderness. There is no rebound and no guarding.  Musculoskeletal: Normal range of motion. She exhibits no edema or tenderness.  Lymphadenopathy:    She has no cervical adenopathy.  Neurological: She is alert and oriented to person, place, and time. She displays abnormal reflex. She exhibits abnormal muscle tone. Coordination abnormal.  Patient has right-sided facial droop, 3 out of 5 weakness to right arm and leg  Skin: Skin is warm and dry. No rash noted. No erythema. No pallor.  Psychiatric: She has a  normal mood and affect. Her behavior is normal. Judgment and thought content normal.  Nursing note and vitals reviewed.   ED Course  Procedures (including critical care time) Labs Review Labs Reviewed  COMPREHENSIVE METABOLIC PANEL - Abnormal; Notable for the following:    Potassium 3.3 (*)    Glucose, Bld 155 (*)    All other components within normal limits  CBC WITH DIFFERENTIAL/PLATELET - Abnormal; Notable for the following:    Neutrophils Relative % 37 (*)    Lymphocytes Relative 53 (*)    Lymphs Abs 4.3 (*)    All other components within normal limits  PROTIME-INR - Abnormal; Notable for the following:    Prothrombin Time 18.4 (*)    INR 1.52 (*)    All other components within normal limits  URINALYSIS, ROUTINE W REFLEX MICROSCOPIC    Imaging Review Ct Head Wo Contrast  09/14/2014   CLINICAL DATA:  Headache with weakness for a week after having a cold. Low grade fever.  EXAM: CT HEAD WITHOUT CONTRAST  TECHNIQUE: Contiguous axial images were obtained from the base of the skull through the vertex without intravenous contrast.  COMPARISON:  MRI brain 06/14/2013.  CT  head 06/14/2013.  FINDINGS: Diffuse cerebral atrophy. Ventricular dilatation consistent with central atrophy. Low-attenuation changes in the deep white matter consistent with small vessel ischemia. Old infarcts in the cerebellar hemispheres. Old area of encephalomalacia in the left frontoparietal region with associated volume loss causing dilatation of the lateral ventricles. This is consistent with old infarct in the MCA distribution. No change since prior study. No mass effect or midline shift. No abnormal extra-axial fluid collections. Gray-white matter junctions are distinct. Basal cisterns are not effaced. No evidence of acute intracranial hemorrhage. Calvarium appears intact. Visualized paranasal sinuses and mastoid air cells are not opacified. Vascular calcifications.  IMPRESSION: No acute intracranial abnormalities. Old left MCA distribution infarct and old cerebellar infarcts similar to prior study. Chronic atrophy and small vessel ischemic changes.   Electronically Signed   By: Lucienne Capers M.D.   On: 09/14/2014 03:47     EKG Interpretation None      MDM   Final diagnoses:  Bad headache  Malaise  Grief reaction  Subtherapeutic international normalized ratio (INR)    54 year old female with headache, acute on chronic weakness, subjective fevers with cough.  Patient does not normally have headaches.  Plan for CT of head, labs, pain control.  5:24 AM Pt reports headache has resolved.  She is feeling much better.  Will d/c.    Linton Flemings, MD 09/14/14 [phone]

## 2014-09-15 DIAGNOSIS — I6789 Other cerebrovascular disease: Secondary | ICD-10-CM | POA: Diagnosis not present

## 2014-09-16 DIAGNOSIS — I6789 Other cerebrovascular disease: Secondary | ICD-10-CM | POA: Diagnosis not present

## 2014-09-17 DIAGNOSIS — I6789 Other cerebrovascular disease: Secondary | ICD-10-CM | POA: Diagnosis not present

## 2014-09-18 DIAGNOSIS — I6789 Other cerebrovascular disease: Secondary | ICD-10-CM | POA: Diagnosis not present

## 2014-09-19 DIAGNOSIS — I6789 Other cerebrovascular disease: Secondary | ICD-10-CM | POA: Diagnosis not present

## 2014-09-20 DIAGNOSIS — I6789 Other cerebrovascular disease: Secondary | ICD-10-CM | POA: Diagnosis not present

## 2014-09-21 DIAGNOSIS — I6789 Other cerebrovascular disease: Secondary | ICD-10-CM | POA: Diagnosis not present

## 2014-09-22 DIAGNOSIS — I6789 Other cerebrovascular disease: Secondary | ICD-10-CM | POA: Diagnosis not present

## 2014-09-23 DIAGNOSIS — I6789 Other cerebrovascular disease: Secondary | ICD-10-CM | POA: Diagnosis not present

## 2014-09-24 DIAGNOSIS — I6789 Other cerebrovascular disease: Secondary | ICD-10-CM | POA: Diagnosis not present

## 2014-09-25 DIAGNOSIS — I6789 Other cerebrovascular disease: Secondary | ICD-10-CM | POA: Diagnosis not present

## 2014-09-26 DIAGNOSIS — I6789 Other cerebrovascular disease: Secondary | ICD-10-CM | POA: Diagnosis not present

## 2014-09-27 DIAGNOSIS — I6789 Other cerebrovascular disease: Secondary | ICD-10-CM | POA: Diagnosis not present

## 2014-09-28 DIAGNOSIS — I6789 Other cerebrovascular disease: Secondary | ICD-10-CM | POA: Diagnosis not present

## 2014-09-29 DIAGNOSIS — I6789 Other cerebrovascular disease: Secondary | ICD-10-CM | POA: Diagnosis not present

## 2014-09-30 DIAGNOSIS — I6789 Other cerebrovascular disease: Secondary | ICD-10-CM | POA: Diagnosis not present

## 2014-10-01 DIAGNOSIS — I6789 Other cerebrovascular disease: Secondary | ICD-10-CM | POA: Diagnosis not present

## 2014-10-02 DIAGNOSIS — I6789 Other cerebrovascular disease: Secondary | ICD-10-CM | POA: Diagnosis not present

## 2014-10-03 DIAGNOSIS — I6789 Other cerebrovascular disease: Secondary | ICD-10-CM | POA: Diagnosis not present

## 2014-10-04 ENCOUNTER — Ambulatory Visit (INDEPENDENT_AMBULATORY_CARE_PROVIDER_SITE_OTHER): Payer: Medicare Other | Admitting: Endocrinology

## 2014-10-04 ENCOUNTER — Encounter: Payer: Self-pay | Admitting: Endocrinology

## 2014-10-04 VITALS — BP 138/96 | HR 105 | Temp 99.1°F | Ht 64.0 in | Wt 178.0 lb

## 2014-10-04 DIAGNOSIS — F329 Major depressive disorder, single episode, unspecified: Secondary | ICD-10-CM | POA: Diagnosis not present

## 2014-10-04 DIAGNOSIS — Z0000 Encounter for general adult medical examination without abnormal findings: Secondary | ICD-10-CM | POA: Diagnosis not present

## 2014-10-04 DIAGNOSIS — I6789 Other cerebrovascular disease: Secondary | ICD-10-CM | POA: Diagnosis not present

## 2014-10-04 DIAGNOSIS — F32A Depression, unspecified: Secondary | ICD-10-CM

## 2014-10-04 MED ORDER — DILTIAZEM HCL ER BEADS 240 MG PO CP24: 240.0000 mg | ORAL_CAPSULE | Freq: Every day | ORAL | Status: DC

## 2014-10-04 MED ORDER — SERTRALINE HCL 100 MG PO TABS: 100.0000 mg | ORAL_TABLET | Freq: Every day | ORAL | Status: DC

## 2014-10-04 MED ORDER — ATORVASTATIN CALCIUM 40 MG PO TABS: 40.0000 mg | ORAL_TABLET | Freq: Every day | ORAL | Status: DC

## 2014-10-04 NOTE — Progress Notes (Signed)
Subjective:    Patient ID: Connie Ruiz, female    DOB: May 24, 1961, 54 y.o.   MRN: [id number]  HPI Depression has recurred off zoloft, but she says this is a mild exacerbation.  No suicidal thoughts. Past Medical History  Diagnosis Date  . THYROID NODULE, LEFT 04/10/2009  . DIABETES MELLITUS, TYPE II 08/21/2007  . HYPERLIPIDEMIA 08/21/2007  . GOUT 08/21/2007  . HYPERTENSION 08/21/2007    Dr. Andree Elk, [place] 08/21/2007  . CEREBROVASCULAR ACCIDENT, ACUTE 04/15/2010  . GERD 08/21/2007  . RENAL INSUFFICIENCY 08/21/2007  . LUPUS 08/21/2007  . OSTEOPOROSIS 08/21/2007    Rheumatol at [facility]  . DVT, HX OF 08/21/2007  . CLOSTRIDIUM DIFFICILE COLITIS, HX OF 08/21/2007  . KIDNEY TRANSPLANTATION, HX OF 08/22/2007    s/p renal transplant-Dr. Andree Elk, [facility]  . Pulmonary embolism 07/16/2010  . Renal failure   . Current use of long term anticoagulation     Dr. Andree Elk, [facility]  . Depression     Dr. Andree Elk, [facility]  . History of stroke with residual effects   . Right sided weakness   . CVA 04/17/2010  . Tachycardia     Past Surgical History  Procedure Laterality Date  . Cholecystectomy    . Tubal ligation    . Kidney transplant Right 2009  . Renal biopsy, open  1981    History   Social History  . Marital Status: Divorced    Spouse Name: N/A  . Number of Children: 1  . Years of Education: N/A   Occupational History  . DISABILITY    Social History Main Topics  . Smoking status: Never Smoker   . Smokeless tobacco: Never Used  . Alcohol Use: No  . Drug Use: No  . Sexual Activity: No   Other Topics Concern  . Not on file   Social History Narrative   Retired   Regular exercise-no    Current Outpatient Prescriptions on File Prior to Visit  Medication Sig Dispense Refill  . calcitRIOL (ROCALTROL) 0.25 MCG capsule TAKE ONE CAPSULE BY MOUTH DAILY 30 capsule 0  . donepezil (ARICEPT) 5 MG tablet Take 1 tablet (5 mg total) by mouth at bedtime. 30 tablet 0  .  esomeprazole (NEXIUM) 20 MG capsule TAKE ONE CAPSULE BY MOUTH EVERY MORNING BEFORE BREAKFAST 90 capsule 0  . folic acid (FOLVITE) 1 MG tablet Take 1 mg by mouth daily.    . Incontinence Supplies MISC "pull-up" type, 6 per day 200 each 11  . losartan (COZAAR) 100 MG tablet Take 1 tablet (100 mg total) by mouth daily. 30 tablet 11  . mycophenolate (CELLCEPT) 250 MG capsule Take 250 mg by mouth 2 (two) times daily.    Marland Kitchen nystatin (MYCOSTATIN) powder Apply 1 g topically 4 (four) times daily as needed. For yeast under breast    . ondansetron (ZOFRAN ODT) 4 MG disintegrating tablet 4mg  ODT q4 hours prn nausea/vomiting (Patient taking differently: Take 4 mg by mouth every 8 (eight) hours as needed for nausea. ) 15 tablet 0  . potassium chloride (K-DUR) 10 MEQ tablet Take 1 tablet (10 mEq total) by mouth daily. 30 tablet 5  . predniSONE (DELTASONE) 1 MG tablet Take 1 tablet (1 mg total) by mouth daily with breakfast.    . predniSONE (DELTASONE) 5 MG tablet Take 5 mg by mouth daily with breakfast.    . senna-docusate (SENOKOT-S) 8.6-50 MG per tablet Take 1 tablet by mouth at bedtime as needed for mild constipation.    Marland Kitchen  solifenacin (VESICARE) 10 MG tablet Take 10 mg by mouth daily.     . tacrolimus (PROGRAF) 1 MG capsule Take 1 mg by mouth 2 (two) times daily.     Marland Kitchen warfarin (COUMADIN) 5 MG tablet Take 1 tablet (5 mg total) by mouth as directed. (Patient taking differently: Take 5 mg by mouth daily at 6 PM. ) 40 tablet 0   No current facility-administered medications on file prior to visit.    Allergies  Allergen Reactions  . Oxycodone-Acetaminophen Shortness Of Breath and Nausea Only  . Propoxyphene N-Acetaminophen Shortness Of Breath and Nausea Only  . Sulfonamide Derivatives Shortness Of Breath and Nausea Only  . Codeine Nausea Only  . Latex Rash  . Metoprolol Rash    Family History  Problem Relation Age of Onset  . Heart attack Mother   . Heart disease Father   . Asthma Sister   . Asthma  Sister   . Asthma Daughter   . Cancer Maternal Grandfather     prostate  . Cancer Paternal Grandfather     colon    BP 138/96 mmHg  Pulse 105  Temp(Src) 99.1 F (37.3 C) (Oral)  Ht 5\' 4"  (1.626 m)  Wt 178 lb (80.74 kg)  BMI 30.54 kg/m2  SpO2 94%    Review of Systems She has insomnia    Objective:   Physical Exam VITAL SIGNS:  See vs page GENERAL: no distress PSYCH: Alert and well-oriented.  Does not appear anxious nor depressed.   Lab Results  Component Value Date   TSH 0.37 10/22/2013       Assessment & Plan:  Depression, recurrent. i have sent a prescription to your pharmacy, to resume zoloft   Subjective:   Patient here for Medicare annual wellness visit and management of other chronic and acute problems.     Risk factors: advanced age    15 of Physicians Providing Medical Care to Patient:  See "snapshot"   Activities of Daily Living: In your present state of health, do you have any difficulty performing the following activities?:  Preparing food and eating?: yes Bathing yourself: yes Getting dressed: yes Using the toilet: yes Moving around from place to place: yes  In the past year have you fallen or had a near fall?: no  Home Safety: Has smoke detector and wears seat belts. No firearms.  Diet and Exercise  Current exercise habits: as limited by health probs (uses a cane) Dietary issues discussed: pt reports a healthy diet   Depression Screen  Q1: Over the past two weeks, have you felt down, depressed or hopeless? yes  Q2: Over the past two weeks, have you felt little interest or pleasure in doing things? no   The following portions of the patient's history were reviewed and updated as appropriate: allergies, current medications, past family history, past medical history, past social history, past surgical history and problem list.   Review of Systems  Denies hearing loss, and visual loss Objective:   Vision:  Sees  opthalmologist Hearing: grossly normal Body mass index:  See vs page Msk: pt slowly performs "get-up-and-go" from a sitting position Cognitive Impairment Assessment: cognition, memory and judgment appear normal.  remembers 2/3 at 5 minutes.  excellent recall.  can easily read and write a sentence.  alert and oriented x 3.     Assessment:   Medicare wellness utd on preventive parameters    Plan:   During the course of the visit the patient was educated and  counseled about appropriate screening and preventive services including:       Fall prevention   Screening mammography  Bone densitometry screening  Diabetes screening  Nutrition counseling   Vaccines / LABS Zostavax / Pneumococcal Vaccine  today  PSA  Patient Instructions (the written plan) was given to the patient.

## 2014-10-04 NOTE — Progress Notes (Signed)
we discussed code status.  pt requests full code, but would not want to be started or maintained on artificial life-support measures if there was not a reasonable chance of recovery 

## 2014-10-04 NOTE — Patient Instructions (Addendum)
Please come back for a regular physical appointment in 3 months. i have sent a prescription to your pharmacy, for resume medication for depression.   good diet and exercise significantly improve your health.  please let me know if you wish to be referred to a dietician.  high blood sugar is very risky to your health.  you should see an eye doctor and dentist every year.  It is very important to get all recommended vaccinations.  please consider these measures for your health:  minimize alcohol.  do not use tobacco products.  have a colonoscopy at least every 10 years from age 68.  Women should have an annual mammogram from age 15.  keep firearms safely stored.  always use seat belts.  have working smoke alarms in your home.  see an eye doctor and dentist regularly.  never drive under the influence of alcohol or drugs (including prescription drugs).   it is critically important to prevent falling down (keep floor areas well-lit, dry, and free of loose objects.  If you have a cane, walker, or wheelchair, you should use it, even for short trips around the house.  Also, try not to rush).

## 2014-10-21 DIAGNOSIS — I6789 Other cerebrovascular disease: Secondary | ICD-10-CM | POA: Diagnosis not present

## 2014-10-22 DIAGNOSIS — I6789 Other cerebrovascular disease: Secondary | ICD-10-CM | POA: Diagnosis not present

## 2014-10-23 DIAGNOSIS — I6789 Other cerebrovascular disease: Secondary | ICD-10-CM | POA: Diagnosis not present

## 2014-10-24 DIAGNOSIS — I6789 Other cerebrovascular disease: Secondary | ICD-10-CM | POA: Diagnosis not present

## 2014-10-25 DIAGNOSIS — I6789 Other cerebrovascular disease: Secondary | ICD-10-CM | POA: Diagnosis not present

## 2014-10-26 DIAGNOSIS — I6789 Other cerebrovascular disease: Secondary | ICD-10-CM | POA: Diagnosis not present

## 2014-10-27 DIAGNOSIS — I6789 Other cerebrovascular disease: Secondary | ICD-10-CM | POA: Diagnosis not present

## 2014-10-28 DIAGNOSIS — I6789 Other cerebrovascular disease: Secondary | ICD-10-CM | POA: Diagnosis not present

## 2014-10-29 DIAGNOSIS — I6789 Other cerebrovascular disease: Secondary | ICD-10-CM | POA: Diagnosis not present

## 2014-10-30 DIAGNOSIS — I6789 Other cerebrovascular disease: Secondary | ICD-10-CM | POA: Diagnosis not present

## 2014-10-31 DIAGNOSIS — I6789 Other cerebrovascular disease: Secondary | ICD-10-CM | POA: Diagnosis not present

## 2014-11-01 DIAGNOSIS — I6789 Other cerebrovascular disease: Secondary | ICD-10-CM | POA: Diagnosis not present

## 2014-11-01 DIAGNOSIS — K2970 Gastritis, unspecified, without bleeding: Secondary | ICD-10-CM | POA: Diagnosis not present

## 2014-11-01 DIAGNOSIS — R1111 Vomiting without nausea: Secondary | ICD-10-CM | POA: Diagnosis not present

## 2014-11-02 ENCOUNTER — Inpatient Hospital Stay (HOSPITAL_COMMUNITY)
Admission: EM | Admit: 2014-11-02 | Discharge: 2014-11-14 | DRG: 392 | Disposition: A | Discharge status: Skilled Nursing Facility | Payer: Medicare Other | Attending: Family Medicine | Admitting: Family Medicine

## 2014-11-02 ENCOUNTER — Emergency Department (HOSPITAL_COMMUNITY): Payer: Medicare Other

## 2014-11-02 ENCOUNTER — Encounter (HOSPITAL_COMMUNITY): Payer: Self-pay | Admitting: Emergency Medicine

## 2014-11-02 DIAGNOSIS — R4701 Aphasia: Secondary | ICD-10-CM | POA: Diagnosis present

## 2014-11-02 DIAGNOSIS — M3214 Glomerular disease in systemic lupus erythematosus: Secondary | ICD-10-CM | POA: Diagnosis present

## 2014-11-02 DIAGNOSIS — B3781 Candidal esophagitis: Secondary | ICD-10-CM | POA: Diagnosis not present

## 2014-11-02 DIAGNOSIS — K317 Polyp of stomach and duodenum: Secondary | ICD-10-CM | POA: Diagnosis not present

## 2014-11-02 DIAGNOSIS — K6389 Other specified diseases of intestine: Secondary | ICD-10-CM | POA: Diagnosis not present

## 2014-11-02 DIAGNOSIS — Z940 Kidney transplant status: Secondary | ICD-10-CM | POA: Diagnosis not present

## 2014-11-02 DIAGNOSIS — I6930 Unspecified sequelae of cerebral infarction: Secondary | ICD-10-CM | POA: Diagnosis not present

## 2014-11-02 DIAGNOSIS — Z86718 Personal history of other venous thrombosis and embolism: Secondary | ICD-10-CM | POA: Diagnosis not present

## 2014-11-02 DIAGNOSIS — Z825 Family history of asthma and other chronic lower respiratory diseases: Secondary | ICD-10-CM | POA: Diagnosis not present

## 2014-11-02 DIAGNOSIS — E1165 Type 2 diabetes mellitus with hyperglycemia: Secondary | ICD-10-CM | POA: Diagnosis present

## 2014-11-02 DIAGNOSIS — I6789 Other cerebrovascular disease: Secondary | ICD-10-CM | POA: Diagnosis not present

## 2014-11-02 DIAGNOSIS — E785 Hyperlipidemia, unspecified: Secondary | ICD-10-CM | POA: Diagnosis present

## 2014-11-02 DIAGNOSIS — T380X5A Adverse effect of glucocorticoids and synthetic analogues, initial encounter: Secondary | ICD-10-CM | POA: Diagnosis not present

## 2014-11-02 DIAGNOSIS — Z794 Long term (current) use of insulin: Secondary | ICD-10-CM | POA: Diagnosis not present

## 2014-11-02 DIAGNOSIS — Z79899 Other long term (current) drug therapy: Secondary | ICD-10-CM | POA: Diagnosis not present

## 2014-11-02 DIAGNOSIS — Z91040 Latex allergy status: Secondary | ICD-10-CM | POA: Diagnosis not present

## 2014-11-02 DIAGNOSIS — K219 Gastro-esophageal reflux disease without esophagitis: Secondary | ICD-10-CM | POA: Diagnosis not present

## 2014-11-02 DIAGNOSIS — E860 Dehydration: Secondary | ICD-10-CM | POA: Diagnosis present

## 2014-11-02 DIAGNOSIS — M810 Age-related osteoporosis without current pathological fracture: Secondary | ICD-10-CM | POA: Diagnosis present

## 2014-11-02 DIAGNOSIS — I10 Essential (primary) hypertension: Secondary | ICD-10-CM | POA: Diagnosis present

## 2014-11-02 DIAGNOSIS — M329 Systemic lupus erythematosus, unspecified: Secondary | ICD-10-CM | POA: Diagnosis not present

## 2014-11-02 DIAGNOSIS — Z800 Family history of malignant neoplasm of digestive organs: Secondary | ICD-10-CM | POA: Diagnosis not present

## 2014-11-02 DIAGNOSIS — R933 Abnormal findings on diagnostic imaging of other parts of digestive tract: Secondary | ICD-10-CM | POA: Diagnosis not present

## 2014-11-02 DIAGNOSIS — K529 Noninfective gastroenteritis and colitis, unspecified: Secondary | ICD-10-CM | POA: Diagnosis not present

## 2014-11-02 DIAGNOSIS — R1084 Generalized abdominal pain: Secondary | ICD-10-CM | POA: Diagnosis not present

## 2014-11-02 DIAGNOSIS — I69351 Hemiplegia and hemiparesis following cerebral infarction affecting right dominant side: Secondary | ICD-10-CM | POA: Diagnosis not present

## 2014-11-02 DIAGNOSIS — R109 Unspecified abdominal pain: Secondary | ICD-10-CM | POA: Diagnosis not present

## 2014-11-02 DIAGNOSIS — E876 Hypokalemia: Secondary | ICD-10-CM | POA: Diagnosis present

## 2014-11-02 DIAGNOSIS — Z8249 Family history of ischemic heart disease and other diseases of the circulatory system: Secondary | ICD-10-CM

## 2014-11-02 DIAGNOSIS — Z6831 Body mass index (BMI) 31.0-31.9, adult: Secondary | ICD-10-CM

## 2014-11-02 DIAGNOSIS — I6350 Cerebral infarction due to unspecified occlusion or stenosis of unspecified cerebral artery: Secondary | ICD-10-CM | POA: Diagnosis present

## 2014-11-02 DIAGNOSIS — M109 Gout, unspecified: Secondary | ICD-10-CM | POA: Diagnosis present

## 2014-11-02 DIAGNOSIS — Z86711 Personal history of pulmonary embolism: Secondary | ICD-10-CM | POA: Diagnosis not present

## 2014-11-02 DIAGNOSIS — Z7952 Long term (current) use of systemic steroids: Secondary | ICD-10-CM

## 2014-11-02 DIAGNOSIS — I509 Heart failure, unspecified: Secondary | ICD-10-CM | POA: Diagnosis present

## 2014-11-02 DIAGNOSIS — F329 Major depressive disorder, single episode, unspecified: Secondary | ICD-10-CM | POA: Diagnosis present

## 2014-11-02 DIAGNOSIS — K449 Diaphragmatic hernia without obstruction or gangrene: Secondary | ICD-10-CM | POA: Diagnosis not present

## 2014-11-02 DIAGNOSIS — Z888 Allergy status to other drugs, medicaments and biological substances status: Secondary | ICD-10-CM

## 2014-11-02 DIAGNOSIS — R791 Abnormal coagulation profile: Secondary | ICD-10-CM

## 2014-11-02 DIAGNOSIS — R100 Acute abdomen: Secondary | ICD-10-CM | POA: Diagnosis not present

## 2014-11-02 DIAGNOSIS — R739 Hyperglycemia, unspecified: Secondary | ICD-10-CM | POA: Diagnosis not present

## 2014-11-02 DIAGNOSIS — E669 Obesity, unspecified: Secondary | ICD-10-CM | POA: Diagnosis present

## 2014-11-02 DIAGNOSIS — Z885 Allergy status to narcotic agent status: Secondary | ICD-10-CM

## 2014-11-02 DIAGNOSIS — Z7901 Long term (current) use of anticoagulants: Secondary | ICD-10-CM | POA: Diagnosis not present

## 2014-11-02 DIAGNOSIS — R935 Abnormal findings on diagnostic imaging of other abdominal regions, including retroperitoneum: Secondary | ICD-10-CM | POA: Diagnosis not present

## 2014-11-02 DIAGNOSIS — F32A Depression, unspecified: Secondary | ICD-10-CM | POA: Diagnosis present

## 2014-11-02 DIAGNOSIS — I82409 Acute embolism and thrombosis of unspecified deep veins of unspecified lower extremity: Secondary | ICD-10-CM | POA: Diagnosis present

## 2014-11-02 DIAGNOSIS — I639 Cerebral infarction, unspecified: Secondary | ICD-10-CM | POA: Diagnosis not present

## 2014-11-02 DIAGNOSIS — D638 Anemia in other chronic diseases classified elsewhere: Secondary | ICD-10-CM | POA: Diagnosis present

## 2014-11-02 DIAGNOSIS — K5289 Other specified noninfective gastroenteritis and colitis: Secondary | ICD-10-CM | POA: Diagnosis not present

## 2014-11-02 DIAGNOSIS — F0390 Unspecified dementia without behavioral disturbance: Secondary | ICD-10-CM | POA: Diagnosis present

## 2014-11-02 DIAGNOSIS — Z882 Allergy status to sulfonamides status: Secondary | ICD-10-CM | POA: Diagnosis not present

## 2014-11-02 DIAGNOSIS — R1013 Epigastric pain: Secondary | ICD-10-CM | POA: Diagnosis not present

## 2014-11-02 HISTORY — DX: Hyperglycemia, unspecified: R73.9

## 2014-11-02 HISTORY — DX: Candidal esophagitis: B37.81

## 2014-11-02 HISTORY — DX: Adverse effect of glucocorticoids and synthetic analogues, initial encounter: T38.0X5A

## 2014-11-02 LAB — CBC WITH DIFFERENTIAL/PLATELET
Basophils Absolute: 0 10*3/uL (ref 0.0–0.1)
Basophils Absolute: 0 10*3/uL (ref 0.0–0.1)
Basophils Relative: 0 % (ref 0–1)
Basophils Relative: 0 % (ref 0–1)
Eosinophils Absolute: 0 10*3/uL (ref 0.0–0.7)
Eosinophils Absolute: 0 10*3/uL (ref 0.0–0.7)
Eosinophils Relative: 0 % (ref 0–5)
Eosinophils Relative: 0 % (ref 0–5)
HCT: 35.5 % — ABNORMAL LOW (ref 36.0–46.0)
HCT: 41.4 % (ref 36.0–46.0)
Hemoglobin: 11.4 g/dL — ABNORMAL LOW (ref 12.0–15.0)
Hemoglobin: 13.7 g/dL (ref 12.0–15.0)
Lymphocytes Relative: 13 % (ref 12–46)
Lymphocytes Relative: 16 % (ref 12–46)
Lymphs Abs: 1.6 10*3/uL (ref 0.7–4.0)
Lymphs Abs: 2.1 10*3/uL (ref 0.7–4.0)
MCH: 28.9 pg (ref 26.0–34.0)
MCH: 29.5 pg (ref 26.0–34.0)
MCHC: 32.1 g/dL (ref 30.0–36.0)
MCHC: 33.1 g/dL (ref 30.0–36.0)
MCV: 89.2 fL (ref 78.0–100.0)
MCV: 89.9 fL (ref 78.0–100.0)
Monocytes Absolute: 0.5 10*3/uL (ref 0.1–1.0)
Monocytes Absolute: 0.8 10*3/uL (ref 0.1–1.0)
Monocytes Relative: 4 % (ref 3–12)
Monocytes Relative: 6 % (ref 3–12)
Neutro Abs: 10.1 10*3/uL — ABNORMAL HIGH (ref 1.7–7.7)
Neutro Abs: 10.4 10*3/uL — ABNORMAL HIGH (ref 1.7–7.7)
Neutrophils Relative %: 78 % — ABNORMAL HIGH (ref 43–77)
Neutrophils Relative %: 83 % — ABNORMAL HIGH (ref 43–77)
Platelets: 165 10*3/uL (ref 150–400)
Platelets: 184 10*3/uL (ref 150–400)
RBC: 3.95 MIL/uL (ref 3.87–5.11)
RBC: 4.64 MIL/uL (ref 3.87–5.11)
RDW: 13.7 % (ref 11.5–15.5)
RDW: 13.8 % (ref 11.5–15.5)
WBC: 12.2 10*3/uL — ABNORMAL HIGH (ref 4.0–10.5)
WBC: 13.4 10*3/uL — ABNORMAL HIGH (ref 4.0–10.5)

## 2014-11-02 LAB — CBC
HCT: 32.5 % — ABNORMAL LOW (ref 36.0–46.0)
Hemoglobin: 10.7 g/dL — ABNORMAL LOW (ref 12.0–15.0)
MCH: 29.5 pg (ref 26.0–34.0)
MCHC: 32.9 g/dL (ref 30.0–36.0)
MCV: 89.5 fL (ref 78.0–100.0)
Platelets: 155 10*3/uL (ref 150–400)
RBC: 3.63 MIL/uL — ABNORMAL LOW (ref 3.87–5.11)
RDW: 13.7 % (ref 11.5–15.5)
WBC: 11.2 10*3/uL — ABNORMAL HIGH (ref 4.0–10.5)

## 2014-11-02 LAB — COMPREHENSIVE METABOLIC PANEL
ALT: 30 U/L (ref 14–54)
ALT: 26 U/L (ref 14–54)
AST: 31 U/L (ref 15–41)
AST: 24 U/L (ref 15–41)
Albumin: 3.7 g/dL (ref 3.5–5.0)
Albumin: 3.2 g/dL — ABNORMAL LOW (ref 3.5–5.0)
Alkaline Phosphatase: 79 U/L (ref 38–126)
Alkaline Phosphatase: 70 U/L (ref 38–126)
Anion gap: 14 (ref 5–15)
Anion gap: 15 (ref 5–15)
BUN: 18 mg/dL (ref 6–20)
BUN: 17 mg/dL (ref 6–20)
CO2: 19 mmol/L — ABNORMAL LOW (ref 22–32)
CO2: 18 mmol/L — ABNORMAL LOW (ref 22–32)
Calcium: 8.8 mg/dL — ABNORMAL LOW (ref 8.9–10.3)
Calcium: 8.5 mg/dL — ABNORMAL LOW (ref 8.9–10.3)
Chloride: 104 mmol/L (ref 101–111)
Chloride: 103 mmol/L (ref 101–111)
Creatinine, Ser: 0.63 mg/dL (ref 0.44–1.00)
Creatinine, Ser: 0.59 mg/dL (ref 0.44–1.00)
GFR calc Af Amer: >60 mL/min (ref >60)
GFR calc Af Amer: >60 mL/min (ref >60)
GFR calc non Af Amer: >60 mL/min (ref >60)
GFR calc non Af Amer: >60 mL/min (ref >60)
Glucose, Bld: 132 mg/dL — ABNORMAL HIGH (ref 65–99)
Glucose, Bld: 122 mg/dL — ABNORMAL HIGH (ref 65–99)
Potassium: 3.8 mmol/L (ref 3.5–5.1)
Potassium: 3.5 mmol/L (ref 3.5–5.1)
Sodium: 137 mmol/L (ref 135–145)
Sodium: 136 mmol/L (ref 135–145)
Total Bilirubin: 1.4 mg/dL — ABNORMAL HIGH (ref 0.3–1.2)
Total Bilirubin: 1 mg/dL (ref 0.3–1.2)
Total Protein: 7 g/dL (ref 6.5–8.1)
Total Protein: 6 g/dL — ABNORMAL LOW (ref 6.5–8.1)

## 2014-11-02 LAB — PROTIME-INR
INR: >10 (ref 0.00–1.49)
INR: >10 (ref 0.00–1.49)
INR: >10 (ref 0.00–1.49)
Prothrombin Time: 84.1 seconds — ABNORMAL HIGH (ref 11.6–15.2)
Prothrombin Time: >90 seconds — ABNORMAL HIGH (ref 11.6–15.2)
Prothrombin Time: >90 seconds — ABNORMAL HIGH (ref 11.6–15.2)

## 2014-11-02 LAB — RETICULOCYTES
RBC.: 3.95 MIL/uL (ref 3.87–5.11)
Retic Count, Absolute: 59.3 10*3/uL (ref 19.0–186.0)
Retic Ct Pct: 1.5 % (ref 0.4–3.1)

## 2014-11-02 LAB — I-STAT CG4 LACTIC ACID, ED: Lactic Acid, Venous: 0.89 mmol/L (ref 0.5–2.0)

## 2014-11-02 LAB — LIPASE, BLOOD: Lipase: 17 U/L — ABNORMAL LOW (ref 22–51)

## 2014-11-02 MED ORDER — SENNOSIDES-DOCUSATE SODIUM 8.6-50 MG PO TABS: 1.0000 | ORAL_TABLET | Freq: Every evening | ORAL | Status: DC | PRN

## 2014-11-02 MED ORDER — MORPHINE SULFATE 15 MG PO TABS
15.0000 mg | ORAL_TABLET | ORAL | Status: DC | PRN
  Administered 2014-11-03 – 2014-11-13 (×15): 15 mg via ORAL
  Filled 2014-11-02 (×15): qty 1

## 2014-11-02 MED ORDER — ONDANSETRON HCL 4 MG/2ML IJ SOLN
4.0000 mg | Freq: Once | INTRAMUSCULAR | Status: AC
  Administered 2014-11-02: 4 mg via INTRAVENOUS
  Filled 2014-11-02: qty 2

## 2014-11-02 MED ORDER — SERTRALINE HCL 100 MG PO TABS
100.0000 mg | ORAL_TABLET | Freq: Every day | ORAL | Status: DC
  Administered 2014-11-03 – 2014-11-14 (×11): 100 mg via ORAL
  Filled 2014-11-02 (×13): qty 1

## 2014-11-02 MED ORDER — IOHEXOL 300 MG/ML  SOLN: 50.0000 mL | Freq: Once | INTRAMUSCULAR | Status: DC | PRN

## 2014-11-02 MED ORDER — SODIUM CHLORIDE 0.9 % IV BOLUS (SEPSIS)
1000.0000 mL | Freq: Once | INTRAVENOUS | Status: AC
  Administered 2014-11-02: 1000 mL via INTRAVENOUS

## 2014-11-02 MED ORDER — SODIUM CHLORIDE 0.9 % IJ SOLN
3.0000 mL | Freq: Two times a day (BID) | INTRAMUSCULAR | Status: DC
  Administered 2014-11-02 – 2014-11-13 (×5): 3 mL via INTRAVENOUS

## 2014-11-02 MED ORDER — DARIFENACIN HYDROBROMIDE ER 7.5 MG PO TB24
7.5000 mg | ORAL_TABLET | Freq: Every day | ORAL | Status: DC
  Administered 2014-11-03 – 2014-11-14 (×11): 7.5 mg via ORAL
  Filled 2014-11-02 (×13): qty 1

## 2014-11-02 MED ORDER — ONDANSETRON HCL 4 MG/2ML IJ SOLN
4.0000 mg | Freq: Four times a day (QID) | INTRAMUSCULAR | Status: DC | PRN
  Administered 2014-11-03 – 2014-11-14 (×7): 4 mg via INTRAVENOUS
  Filled 2014-11-02 (×7): qty 2

## 2014-11-02 MED ORDER — METRONIDAZOLE IN NACL 5-0.79 MG/ML-% IV SOLN
500.0000 mg | Freq: Three times a day (TID) | INTRAVENOUS | Status: DC
  Administered 2014-11-02 – 2014-11-09 (×21): 500 mg via INTRAVENOUS
  Filled 2014-11-02 (×23): qty 100

## 2014-11-02 MED ORDER — DILTIAZEM HCL ER BEADS 240 MG PO CP24
240.0000 mg | ORAL_CAPSULE | Freq: Every day | ORAL | Status: DC
  Administered 2014-11-03 – 2014-11-14 (×12): 240 mg via ORAL
  Filled 2014-11-02 (×17): qty 1

## 2014-11-02 MED ORDER — IOHEXOL 300 MG/ML  SOLN
100.0000 mL | Freq: Once | INTRAMUSCULAR | Status: AC | PRN
  Administered 2014-11-02: 100 mL via INTRAVENOUS

## 2014-11-02 MED ORDER — LOSARTAN POTASSIUM 50 MG PO TABS
100.0000 mg | ORAL_TABLET | Freq: Every day | ORAL | Status: DC
  Filled 2014-11-02 (×2): qty 2

## 2014-11-02 MED ORDER — PREDNISONE 5 MG PO TABS
5.0000 mg | ORAL_TABLET | Freq: Every day | ORAL | Status: DC
  Administered 2014-11-03 – 2014-11-06 (×4): 5 mg via ORAL
  Filled 2014-11-02 (×5): qty 1

## 2014-11-02 MED ORDER — TACROLIMUS 1 MG PO CAPS
1.0000 mg | ORAL_CAPSULE | Freq: Two times a day (BID) | ORAL | Status: DC
  Administered 2014-11-02 – 2014-11-14 (×23): 1 mg via ORAL
  Filled 2014-11-02 (×25): qty 1

## 2014-11-02 MED ORDER — PANTOPRAZOLE SODIUM 40 MG PO TBEC: 40.0000 mg | DELAYED_RELEASE_TABLET | Freq: Every day | ORAL | Status: DC

## 2014-11-02 MED ORDER — ACETAMINOPHEN 650 MG RE SUPP: 650.0000 mg | Freq: Four times a day (QID) | RECTAL | Status: DC | PRN

## 2014-11-02 MED ORDER — ONDANSETRON HCL 4 MG PO TABS
4.0000 mg | ORAL_TABLET | Freq: Four times a day (QID) | ORAL | Status: DC | PRN
  Filled 2014-11-02: qty 1

## 2014-11-02 MED ORDER — FOLIC ACID 1 MG PO TABS
1.0000 mg | ORAL_TABLET | Freq: Every day | ORAL | Status: DC
  Administered 2014-11-03 – 2014-11-14 (×11): 1 mg via ORAL
  Filled 2014-11-02 (×14): qty 1

## 2014-11-02 MED ORDER — SODIUM CHLORIDE 0.9 % IV SOLN
INTRAVENOUS | Status: DC
  Administered 2014-11-02 – 2014-11-10 (×11): via INTRAVENOUS
  Administered 2014-11-11: 500 mL via INTRAVENOUS
  Administered 2014-11-11 – 2014-11-14 (×4): via INTRAVENOUS

## 2014-11-02 MED ORDER — PANTOPRAZOLE SODIUM 40 MG IV SOLR
40.0000 mg | Freq: Two times a day (BID) | INTRAVENOUS | Status: DC
  Administered 2014-11-02 – 2014-11-14 (×25): 40 mg via INTRAVENOUS
  Filled 2014-11-02 (×26): qty 40

## 2014-11-02 MED ORDER — DONEPEZIL HCL 5 MG PO TABS
5.0000 mg | ORAL_TABLET | Freq: Every day | ORAL | Status: DC
  Administered 2014-11-02 – 2014-11-13 (×12): 5 mg via ORAL
  Filled 2014-11-02 (×12): qty 1

## 2014-11-02 MED ORDER — MYCOPHENOLATE MOFETIL 250 MG PO CAPS
250.0000 mg | ORAL_CAPSULE | Freq: Two times a day (BID) | ORAL | Status: DC
  Administered 2014-11-02 – 2014-11-14 (×24): 250 mg via ORAL
  Filled 2014-11-02 (×25): qty 1

## 2014-11-02 MED ORDER — CALCITRIOL 0.25 MCG PO CAPS
0.2500 ug | ORAL_CAPSULE | Freq: Every day | ORAL | Status: DC
  Administered 2014-11-03 – 2014-11-14 (×11): 0.25 ug via ORAL
  Filled 2014-11-02 (×13): qty 1

## 2014-11-02 MED ORDER — ATORVASTATIN CALCIUM 40 MG PO TABS
40.0000 mg | ORAL_TABLET | Freq: Every day | ORAL | Status: DC
  Administered 2014-11-03 – 2014-11-13 (×11): 40 mg via ORAL
  Filled 2014-11-02 (×13): qty 1

## 2014-11-02 MED ORDER — MORPHINE SULFATE 2 MG/ML IJ SOLN
2.0000 mg | INTRAMUSCULAR | Status: DC | PRN
  Administered 2014-11-02 – 2014-11-04 (×9): 2 mg via INTRAVENOUS
  Filled 2014-11-02 (×9): qty 1

## 2014-11-02 MED ORDER — MORPHINE SULFATE 4 MG/ML IJ SOLN
4.0000 mg | Freq: Once | INTRAMUSCULAR | Status: AC
  Administered 2014-11-02: 4 mg via INTRAVENOUS
  Filled 2014-11-02: qty 1

## 2014-11-02 MED ORDER — PHYTONADIONE 5 MG PO TABS
5.0000 mg | ORAL_TABLET | Freq: Once | ORAL | Status: AC
  Administered 2014-11-02: 5 mg via ORAL
  Filled 2014-11-02: qty 1

## 2014-11-02 MED ORDER — CEFTRIAXONE SODIUM IN DEXTROSE 40 MG/ML IV SOLN
2.0000 g | Freq: Every day | INTRAVENOUS | Status: DC
  Administered 2014-11-02 – 2014-11-09 (×8): 2 g via INTRAVENOUS
  Filled 2014-11-02 (×8): qty 50

## 2014-11-02 MED ORDER — ACETAMINOPHEN 325 MG PO TABS: 650.0000 mg | ORAL_TABLET | Freq: Four times a day (QID) | ORAL | Status: DC | PRN

## 2014-11-02 MED ORDER — VITAMIN K1 10 MG/ML IJ SOLN
5.0000 mg | Freq: Once | INTRAVENOUS | Status: AC
  Administered 2014-11-02: 5 mg via INTRAVENOUS
  Filled 2014-11-02: qty 0.5

## 2014-11-02 MED ORDER — SODIUM CHLORIDE 0.9 % IV BOLUS (SEPSIS)
250.0000 mL | Freq: Once | INTRAVENOUS | Status: AC
  Administered 2014-11-02: 250 mL via INTRAVENOUS

## 2014-11-02 NOTE — Progress Notes (Signed)
ANTICOAGULATION CONSULT NOTE - Initial Consult  Pharmacy Consult for warfarin Indication: DVT and stroke  Allergies  Allergen Reactions  . Oxycodone-Acetaminophen Shortness Of Breath and Nausea Only  . Propoxyphene N-Acetaminophen Shortness Of Breath and Nausea Only  . Sulfonamide Derivatives Shortness Of Breath and Nausea Only  . Codeine Nausea Only  . Latex Rash  . Metoprolol Rash    Patient Measurements: Height: 5\' 4"  (162.6 cm) Weight: 180 lb 12.8 oz (82.01 kg) IBW/kg (Calculated) : 54.7 Heparin Dosing Weight:   Vital Signs: Temp: 98.4 F (36.9 C) (05/28 0555) Temp Source: Oral (05/28 0555) BP: 187/98 mmHg (05/28 0555) Pulse Rate: 100 (05/28 0555)  Labs:  Recent Labs  11/02/14 0023 11/02/14 0337  HGB 13.7 10.7*  HCT 41.4 32.5*  PLT 184 155  LABPROT 84.1*  --   INR >10.00*  --   CREATININE 0.63  --     Estimated Creatinine Clearance: 83.3 mL/min (by C-G formula based on Cr of 0.63).   Medical History: Past Medical History  Diagnosis Date  . THYROID NODULE, LEFT 04/10/2009  . DIABETES MELLITUS, TYPE II 08/21/2007  . HYPERLIPIDEMIA 08/21/2007  . GOUT 08/21/2007  . HYPERTENSION 08/21/2007    Dr. Andree Elk, [place] 08/21/2007  . CEREBROVASCULAR ACCIDENT, ACUTE 04/15/2010  . GERD 08/21/2007  . RENAL INSUFFICIENCY 08/21/2007  . LUPUS 08/21/2007  . OSTEOPOROSIS 08/21/2007    Rheumatol at [facility]  . DVT, HX OF 08/21/2007  . CLOSTRIDIUM DIFFICILE COLITIS, HX OF 08/21/2007  . KIDNEY TRANSPLANTATION, HX OF 08/22/2007    s/p renal transplant-[facility]. Andree Elk, [facility]  . Pulmonary embolism 07/16/2010  . Renal failure   . Current use of long term anticoagulation     Dr. Andree Elk, [facility]  . Depression     Dr. Andree Elk, [facility]  . History of stroke with residual effects   . Right sided weakness   . CVA 04/17/2010  . Tachycardia     Medications:  Prescriptions prior to admission  Medication Sig Dispense Refill Last Dose  . atorvastatin (LIPITOR) 40 MG  tablet Take 1 tablet (40 mg total) by mouth daily. 90 tablet 3 11/01/2014 at Unknown time  . calcitRIOL (ROCALTROL) 0.25 MCG capsule TAKE ONE CAPSULE BY MOUTH DAILY 30 capsule 0 11/01/2014 at Unknown time  . diltiazem (TIAZAC) 240 MG 24 hr capsule Take 1 capsule (240 mg total) by mouth daily. 30 capsule 11 11/01/2014 at Unknown time  . donepezil (ARICEPT) 5 MG tablet Take 1 tablet (5 mg total) by mouth at bedtime. 30 tablet 0 11/01/2014 at Unknown time  . esomeprazole (NEXIUM) 20 MG capsule TAKE ONE CAPSULE BY MOUTH EVERY MORNING BEFORE BREAKFAST 90 capsule 0 11/01/2014 at Unknown time  . folic acid (FOLVITE) 1 MG tablet Take 1 mg by mouth daily.   11/01/2014 at Unknown time  . losartan (COZAAR) 100 MG tablet Take 1 tablet (100 mg total) by mouth daily. 30 tablet 11 11/01/2014 at Unknown time  . mycophenolate (CELLCEPT) 250 MG capsule Take 250 mg by mouth 2 (two) times daily.   11/01/2014 at Unknown time  . ondansetron (ZOFRAN ODT) 4 MG disintegrating tablet 4mg  ODT q4 hours prn nausea/vomiting (Patient taking differently: Take 4 mg by mouth every 8 (eight) hours as needed for nausea. ) 15 tablet 0 Past Week at Unknown time  . potassium chloride (K-DUR) 10 MEQ tablet Take 1 tablet (10 mEq total) by mouth daily. 30 tablet 5 11/01/2014 at Unknown time  . predniSONE (DELTASONE) 5 MG tablet Take  5 mg by mouth daily with breakfast.   11/01/2014 at Unknown time  . senna-docusate (SENOKOT-S) 8.6-50 MG per tablet Take 1 tablet by mouth at bedtime as needed for mild constipation.   11/01/2014 at Unknown time  . sertraline (ZOLOFT) 100 MG tablet Take 1 tablet (100 mg total) by mouth daily. 30 tablet 11 11/01/2014 at Unknown time  . solifenacin (VESICARE) 10 MG tablet Take 10 mg by mouth daily.    11/01/2014 at Unknown time  . tacrolimus (PROGRAF) 1 MG capsule Take 1 mg by mouth 2 (two) times daily.    11/01/2014 at Unknown time  . warfarin (COUMADIN) 5 MG tablet Take 1 tablet (5 mg total) by mouth as directed. (Patient  taking differently: Take 7.5 mg by mouth daily at 6 PM. ) 40 tablet 0 11/01/2014 at Unknown time  . Incontinence Supplies MISC "pull-up" type, 6 per day 200 each 11 Taking  . nystatin (MYCOSTATIN) powder Apply 1 g topically 4 (four) times daily as needed. For yeast under breast   unknown at unknown  . predniSONE (DELTASONE) 1 MG tablet Take 1 tablet (1 mg total) by mouth daily with breakfast. (Patient not taking: Reported on 11/02/2014)   Taking    Assessment: Patient with chronic warfarin for DVT/CVA.  INR > 10 on admit.  Vitamin K 5mg  po x1 given in ED at 0200.    Goal of Therapy:  INR 2-3    Plan:  Daily INR Follow up with repeat INR already ordered  Tyler Deis, Shea Stakes Crowford 11/02/2014,6:23 AM

## 2014-11-02 NOTE — Progress Notes (Signed)
HR has been sustaining at 120-130's, sinus tachycardia. Patient is resting and asymptomatic. Jonette Eva NP was made aware. We will continue to monitor.

## 2014-11-02 NOTE — Progress Notes (Signed)
ANTIBIOTIC CONSULT NOTE - INITIAL  Pharmacy Consult for Ceftriaxone Indication: Intra-abdominal infection (jejunitis)  Allergies  Allergen Reactions  . Oxycodone-Acetaminophen Shortness Of Breath and Nausea Only  . Propoxyphene N-Acetaminophen Shortness Of Breath and Nausea Only  . Sulfonamide Derivatives Shortness Of Breath and Nausea Only  . Codeine Nausea Only  . Latex Rash  . Metoprolol Rash    Patient Measurements: Height: 5\' 4"  (162.6 cm) Weight: 180 lb 12.8 oz (82.01 kg) IBW/kg (Calculated) : 54.7 Adjusted Body Weight:   Vital Signs: Temp: 98.4 F (36.9 C) (05/28 0555) Temp Source: Oral (05/28 0555) BP: 187/98 mmHg (05/28 0555) Pulse Rate: 100 (05/28 0555) Intake/Output from previous day:   Intake/Output from this shift:    Labs:  Recent Labs  11/02/14 0023 11/02/14 0337  WBC 12.2* 11.2*  HGB 13.7 10.7*  PLT 184 155  CREATININE 0.63  --    Estimated Creatinine Clearance: 83.3 mL/min (by C-G formula based on Cr of 0.63). No results for input(s): VANCOTROUGH, VANCOPEAK, VANCORANDOM, GENTTROUGH, GENTPEAK, GENTRANDOM, TOBRATROUGH, TOBRAPEAK, TOBRARND, AMIKACINPEAK, AMIKACINTROU, AMIKACIN in the last 72 hours.   Microbiology: No results found for this or any previous visit (from the past 720 hour(s)).  Medical History: Past Medical History  Diagnosis Date  . THYROID NODULE, LEFT 04/10/2009  . DIABETES MELLITUS, TYPE II 08/21/2007  . HYPERLIPIDEMIA 08/21/2007  . GOUT 08/21/2007  . HYPERTENSION 08/21/2007    Dr. Andree Elk, [place] 08/21/2007  . CEREBROVASCULAR ACCIDENT, ACUTE 04/15/2010  . GERD 08/21/2007  . RENAL INSUFFICIENCY 08/21/2007  . LUPUS 08/21/2007  . OSTEOPOROSIS 08/21/2007    Rheumatol at [facility]  . DVT, HX OF 08/21/2007  . CLOSTRIDIUM DIFFICILE COLITIS, HX OF 08/21/2007  . KIDNEY TRANSPLANTATION, HX OF 08/22/2007    s/p renal transplant-Dr. Andree Elk, [facility]  . Pulmonary embolism 07/16/2010  . Renal failure   . Current use of  long term anticoagulation     Dr. Andree Elk, [facility]  . Depression     Dr. Andree Elk, [facility]  . History of stroke with residual effects   . Right sided weakness   . CVA 04/17/2010  . Tachycardia     Medications:  Anti-infectives    Start     Dose/Rate Route Frequency Ordered Stop   11/02/14 0630  cefTRIAXone (ROCEPHIN) 2 g in dextrose 5 % 50 mL IVPB - Premix     2 g 100 mL/hr over 30 Minutes Intravenous Daily 11/02/14 0621     11/02/14 0615  metroNIDAZOLE (FLAGYL) IVPB 500 mg     500 mg 100 mL/hr over 60 Minutes Intravenous 3 times per day 11/02/14 0608       Assessment: Patient with abdominal pain in ED with CT showing jejunitis.  Ceftriaxone per pharmacy ordered.  Goal of Therapy:  Rocephin based on manufacturer dosing recommendations.  Plan: Ceftriaxone 2gm iv q24hr Follow up culture results  [name] 11/02/2014,6:27 AM

## 2014-11-02 NOTE — Progress Notes (Addendum)
Pt admitted after midnight. Please see earlier admission note buy Dr. Posey Pronto. Pt admitted for evaluation of abd pain 3 days in duration. CT abd notable for jejunitis. Pt started on Rocephin and Flagyl. Blood work notable for INR > 10, pt given 2 doses of Vit K 5 mg IV. Repeat IN again in AM. Repeat CBC and BMP in AM  Faye Ramsay, MD  [facility] Pager [phone] Cell [phone]  If 7PM-7AM, please contact night-coverage [email or web address] Password [email or web address]

## 2014-11-02 NOTE — ED Notes (Addendum)
Brought in by EMS from home with c/o abdominal pain.  Pt reports she started having abdominal pain 3 days ago with nausea and vomiting, no diarrhea.  Pt reports pain that has gotten progressively worse today.  Pt reports "coffee-ground" emesis.

## 2014-11-02 NOTE — H&P (Signed)
[facility] History and Physical  Patient: Connie Ruiz  MRN: [id number]  DOB: 01-Sep-1960  DOS: the patient was seen and examined on 11/02/2014 PCP: Renato Shin, MD  Referring physician: Dr. Claudine Mouton Chief Complaint: Abdominal pain  HPI: [name] is a 54 y.o. 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 The patient is presenting with complaints of abdominal pain. She mentions the pain is located all over her abdomen feels like a sharp pain she also had nausea with vomiting. She initially mentioned. If region that it was coffee color but she denied any blood or coffee color vomiting to me. She denied any fever or chills. She denies any diarrhea and mentions that she actually has constipation at present. She mentions the pain has been ongoing for last 3 days. She mentions she has similar pain in the past and she was told that she has swelling of her abdomen. She denies any burning urination blood in the urine denies any chest pain or shortness of breath or cough. She has chronic leg swelling which is unchanged. She mentions she is compliant with all her medication and there is no recent change in her medication.  The patient is coming from home. And at her baseline independent for most of her ADL.  Review of Systems: as mentioned in the history of present illness.  A comprehensive review of the other systems is negative.  Past Medical History  Diagnosis Date  . THYROID NODULE, LEFT 04/10/2009  . DIABETES MELLITUS, TYPE II 08/21/2007  . HYPERLIPIDEMIA 08/21/2007  . GOUT 08/21/2007  . HYPERTENSION 08/21/2007    Dr. Andree Elk, [place] 08/21/2007  . CEREBROVASCULAR ACCIDENT, ACUTE 04/15/2010  . GERD 08/21/2007  . RENAL  INSUFFICIENCY 08/21/2007  . LUPUS 08/21/2007  . OSTEOPOROSIS 08/21/2007    [facility] at [facility]  . DVT, HX OF 08/21/2007  . CLOSTRIDIUM DIFFICILE COLITIS, HX OF 08/21/2007  . KIDNEY TRANSPLANTATION, HX OF 08/22/2007    s/p renal transplant-Dr. Andree Elk, [facility]  . Pulmonary embolism 07/16/2010  . Renal failure   . Current use of long term anticoagulation     Dr. Andree Elk, [facility]  . Depression     Dr. Andree Elk, [facility]  . History of stroke with residual effects   . Right sided weakness   . CVA 04/17/2010  . Tachycardia    Past Surgical History  Procedure Laterality Date  . Cholecystectomy    . Tubal ligation    . Kidney transplant Right 2009  . Renal biopsy, open  1981   Social History:  reports that she has never smoked. She has never used smokeless tobacco. She reports that she does not drink alcohol or use illicit drugs.  Allergies  Allergen Reactions  . Oxycodone-Acetaminophen Shortness Of Breath and Nausea Only  . Propoxyphene N-Acetaminophen Shortness Of Breath and Nausea Only  . Sulfonamide Derivatives Shortness Of Breath and Nausea Only  . Codeine Nausea Only  . Latex Rash  . Metoprolol Rash    Family History  Problem Relation Age of Onset  . Heart attack Mother   . Heart disease Father   . Asthma Sister   . Asthma Sister   . Asthma Daughter   . Cancer Maternal Grandfather     prostate  . Cancer Paternal Grandfather     colon  Prior to Admission medications   Medication Sig Start Date End Date Taking? Authorizing Provider  atorvastatin (LIPITOR) 40 MG tablet Take 1 tablet (40 mg total) by mouth daily. 10/04/14  Yes Renato Shin, MD  calcitRIOL (ROCALTROL) 0.25 MCG capsule TAKE ONE CAPSULE BY MOUTH DAILY 04/30/14  Yes Renato Shin, MD  diltiazem (TIAZAC) 240 MG 24 hr capsule Take 1 capsule (240 mg total) by mouth daily. 10/04/14  Yes Renato Shin, MD  donepezil (ARICEPT) 5 MG tablet Take 1 tablet (5 mg total) by mouth at bedtime. 05/29/14  Yes Adam Telford Nab, DO    esomeprazole (NEXIUM) 20 MG capsule TAKE ONE CAPSULE BY MOUTH EVERY MORNING BEFORE BREAKFAST 02/13/14  Yes Renato Shin, MD  folic acid (FOLVITE) 1 MG tablet Take 1 mg by mouth daily.   Yes Historical Provider, MD  losartan (COZAAR) 100 MG tablet Take 1 tablet (100 mg total) by mouth daily. 11/15/13  Yes Renato Shin, MD  mycophenolate (CELLCEPT) 250 MG capsule Take 250 mg by mouth 2 (two) times daily.   Yes Historical Provider, MD  ondansetron (ZOFRAN ODT) 4 MG disintegrating tablet 4mg  ODT q4 hours prn nausea/vomiting Patient taking differently: Take 4 mg by mouth every 8 (eight) hours as needed for nausea.  06/28/14  Yes Sherwood Gambler, MD  potassium chloride (K-DUR) 10 MEQ tablet Take 1 tablet (10 mEq total) by mouth daily. 04/07/14  Yes Renato Shin, MD  predniSONE (DELTASONE) 5 MG tablet Take 5 mg by mouth daily with breakfast.   Yes Historical Provider, MD  senna-docusate (SENOKOT-S) 8.6-50 MG per tablet Take 1 tablet by mouth at bedtime as needed for mild constipation. 06/19/13  Yes Hosie Poisson, MD  sertraline (ZOLOFT) 100 MG tablet Take 1 tablet (100 mg total) by mouth daily. 10/04/14  Yes Renato Shin, MD  solifenacin (VESICARE) 10 MG tablet Take 10 mg by mouth daily.    Yes Historical Provider, MD  tacrolimus (PROGRAF) 1 MG capsule Take 1 mg by mouth 2 (two) times daily.  04/16/14  Yes Historical Provider, MD  warfarin (COUMADIN) 5 MG tablet Take 1 tablet (5 mg total) by mouth as directed. Patient taking differently: Take 7.5 mg by mouth daily at 6 PM.  07/19/14  Yes Lorretta Harp, MD  Incontinence Supplies MISC "pull-up" type, 6 per day 08/10/13   Renato Shin, MD  nystatin (MYCOSTATIN) powder Apply 1 g topically 4 (four) times daily as needed. For yeast under breast    Historical Provider, MD  predniSONE (DELTASONE) 1 MG tablet Take 1 tablet (1 mg total) by mouth daily with breakfast. Patient not taking: Reported on 11/02/2014 06/25/13   Eugenie Filler, MD    Physical Exam: Filed  Vitals:   11/02/14 0330 11/02/14 0434 11/02/14 0500 11/02/14 0555  BP: 139/66 193/88 177/89 187/98  Pulse: 89 112 98 100  Temp:    98.4 F (36.9 C)  TempSrc:    Oral  Resp:  23  19  Height:    5\' 4"  (1.626 m)  Weight:    82.01 kg (180 lb 12.8 oz)  SpO2: 96% 95% 97% 97%    General: Alert, Awake and Oriented to Time, Place and Person. Appear in mild distress Eyes: PERRL ENT: Oral Mucosa clear moist. Neck: no JVD Cardiovascular: S1 and S2 Present, aortic systolic Murmur, Peripheral Pulses Present Respiratory: Bilateral Air entry equal and Decreased,  Clear to Auscultation, no Crackles, no wheezes Abdomen: Bowel Sound sluggish, Soft and diffusely tender Skin: no Rash Extremities: Bilateral Pedal edema,  bilateral calf tenderness Neurologic: Grossly no focal neuro deficit.  Labs on Admission:  CBC:  Recent Labs Lab 11/02/14 0023 11/02/14 0337  WBC 12.2* 11.2*  NEUTROABS 10.1*  --   HGB 13.7 10.7*  HCT 41.4 32.5*  MCV 89.2 89.5  PLT 184 155    CMP     Component Value Date/Time   NA 137 11/02/2014 0023   K 3.8 11/02/2014 0023   CL 104 11/02/2014 0023   CO2 19* 11/02/2014 0023   GLUCOSE 132* 11/02/2014 0023   BUN 18 11/02/2014 0023   CREATININE 0.63 11/02/2014 0023   CALCIUM 8.8* 11/02/2014 0023   CALCIUM 8.7 01/08/2011 1729   PROT 7.0 11/02/2014 0023   ALBUMIN 3.7 11/02/2014 0023   AST 31 11/02/2014 0023   ALT 30 11/02/2014 0023   ALKPHOS 79 11/02/2014 0023   BILITOT 1.4* 11/02/2014 0023   GFRNONAA >60 11/02/2014 0023   GFRAA >60 11/02/2014 0023     Recent Labs Lab 11/02/14 0023  LIPASE 17*    No results for input(s): CKTOTAL, CKMB, CKMBINDEX, TROPONINI in the last 168 hours. BNP (last 3 results) No results for input(s): BNP in the last 8760 hours.  ProBNP (last 3 results) No results for input(s): PROBNP in the last 8760 hours.   Radiological Exams on Admission: Ct Abdomen Pelvis W Contrast  11/02/2014   CLINICAL DATA:  Acute abdominal pain   EXAM: CT ABDOMEN AND PELVIS WITH CONTRAST  TECHNIQUE: Multidetector CT imaging of the abdomen and pelvis was performed using the standard protocol following bolus administration of intravenous contrast.  CONTRAST:  162mL OMNIPAQUE IOHEXOL 300 MG/ML  SOLN  COMPARISON:  06/28/2014  FINDINGS: BODY WALL: No contributory findings.  LOWER CHEST: Extensive coronary atherosclerosis for age  ABDOMEN/PELVIS:  Liver: No focal abnormality.  Biliary: Cholecystectomy with chronic intra and extrahepatic biliary duct enlargement  Pancreas: Unremarkable.  Spleen: Single coarse calcification, likely granulomatous.  Adrenals: Unremarkable.  Kidneys and ureters: There is a transplant kidney in the right lower quadrant, already excreting contrast. Would always consult with transplant surgeon prior to administering IV contrast. Severe native renal atrophy with stable low-density renal lesions which are indeterminate due to soft tissue density, but size stable since 2013, 22 mm on the left and 16 mm on the right.  Bladder: Unremarkable.  Reproductive: No pathologic findings.  Bowel: There is severe wall thickening of a jejunum segment with low-density submucosal appearance consistent with edema. The mesentery is diffusely edematous. The associated vessels appear widely patent. There is no evidence of perforation or abscess. No bowel obstruction. Distal colonic diverticulosis. Normal appendix.  Retroperitoneum: No mass or adenopathy.  Peritoneum: Small ascites around the liver and in the pelvis, considered reactive.  Vascular: No acute abnormality.  OSSEOUS: No acute abnormalities.  IMPRESSION: 1. Severe short-segment jejunitis, likely infectious or lupus enteritis (given patient's history). 2. Bilateral native renal lesions are indeterminate due to density, but size stable from 2013. 3. Additional chronic findings are stable from prior noted above.   Electronically Signed   By: Monte Fantasia M.D.   On: 11/02/2014 04:41    Assessment/Plan Principal Problem:   Jejunitis Active Problems:   Essential hypertension   Cerebral artery occlusion with cerebral infarction   LUPUS   KIDNEY TRANSPLANTATION, HX OF   DVT (deep venous thrombosis)   Depression   History of stroke with residual effects   Supratherapeutic INR   1. Jejunitis The patient is presenting with complaints of abdominal pain going for 3 days. She  had nausea vomiting as well. CAT scan a showing jejunitis. At present I would treat her with Cipro and Flagyl. She remains nothing by mouth. Morphine as needed for pain management. Continue prednisone. Protonix every 12 hours.  2. Supratherapeutic INR. Anemia. We will check H&H as well as a reticulocyte count. Patient has received vitamin K. We will give Protonix every 12 hours. CAT scan is negative for any intra-abdominal hemorrhage. Warfarin per pharmacy when INR is therapeutic.  3. History of renal transplant, lupus. Continue immunosuppressive medications.  4. Accelerated hypertension. Secondary to pain. When necessary pain medication. Continue home blood pressure medications.  5. Morbid disorder. Continue Zoloft.  Advance goals of care discussion: Full code   DVT Prophylaxis: on chronic therapeutic anticoagulation. Nutrition: Nothing by mouth except medications  Disposition: Admitted as inpatient, Telemetry unit.  Author: Berle Mull, MD [facility] Pager: [phone] 11/02/2014  If 7PM-7AM, please contact night-coverage [email or web address] Password [email or web address]

## 2014-11-02 NOTE — ED Notes (Signed)
Bed: WA19 Expected date:  Expected time:  Means of arrival:  Comments: EMS 

## 2014-11-02 NOTE — ED Provider Notes (Signed)
CSN: [id number]     Arrival date & time 11/02/14  0002 History   First MD Initiated Contact with Patient 11/02/14 0234     Chief Complaint  Patient presents with  . Abdominal Pain     (Consider location/radiation/quality/duration/timing/severity/associated sxs/prior Treatment) HPI Connie Ruiz is a 54 y.o. female with past medical history of diabetes, hyperlipidemia, hypertension, CHF, CVA, DVT on warfarin, kidney transplant presenting tonight with severe abdominal pain. She states is going on for the past 3 days. She's had nausea and vomiting as well of coffee-ground emesis. She denies any blood in her stools. She's not had any fevers or recent infections. Patient has had normal urination, she denies any further complaints.  10 Systems reviewed and are negative for acute change except as noted in the HPI.`    Past Medical History  Diagnosis Date  . THYROID NODULE, LEFT 04/10/2009  . DIABETES MELLITUS, TYPE II 08/21/2007  . HYPERLIPIDEMIA 08/21/2007  . GOUT 08/21/2007  . HYPERTENSION 08/21/2007    Dr. Andree Elk, [place] 08/21/2007  . CEREBROVASCULAR ACCIDENT, ACUTE 04/15/2010  . GERD 08/21/2007  . RENAL INSUFFICIENCY 08/21/2007  . LUPUS 08/21/2007  . OSTEOPOROSIS 08/21/2007    Rheumatol at [facility]  . DVT, HX OF 08/21/2007  . CLOSTRIDIUM DIFFICILE COLITIS, HX OF 08/21/2007  . KIDNEY TRANSPLANTATION, HX OF 08/22/2007    s/p renal transplant-Dr. Andree Elk, [facility]  . Pulmonary embolism 07/16/2010  . Renal failure   . Current use of long term anticoagulation     Dr. Andree Elk, [facility]  . Depression     Dr. Andree Elk, [facility]  . History of stroke with residual effects   . Right sided weakness   . CVA 04/17/2010  . Tachycardia    Past Surgical History  Procedure Laterality Date  . Cholecystectomy    . Tubal ligation    . Kidney transplant Right 2009  . Renal biopsy, open  1981   Family History  Problem Relation Age of Onset  . Heart attack Mother   . Heart disease Father    . Asthma Sister   . Asthma Sister   . Asthma Daughter   . Cancer Maternal Grandfather     prostate  . Cancer Paternal Grandfather     colon   History  Substance Use Topics  . Smoking status: Never Smoker   . Smokeless tobacco: Never Used  . Alcohol Use: No   OB History    No data available     Review of Systems    Allergies  Oxycodone-acetaminophen; Propoxyphene n-acetaminophen; Sulfonamide derivatives; Codeine; Latex; and Metoprolol  Home Medications   Prior to Admission medications   Medication Sig Start Date End Date Taking? Authorizing Provider  atorvastatin (LIPITOR) 40 MG tablet Take 1 tablet (40 mg total) by mouth daily. 10/04/14  Yes Renato Shin, MD  calcitRIOL (ROCALTROL) 0.25 MCG capsule TAKE ONE CAPSULE BY MOUTH DAILY 04/30/14  Yes Renato Shin, MD  diltiazem (TIAZAC) 240 MG 24 hr capsule Take 1 capsule (240 mg total) by mouth daily. 10/04/14  Yes Renato Shin, MD  donepezil (ARICEPT) 5 MG tablet Take 1 tablet (5 mg total) by mouth at bedtime. 05/29/14  Yes Adam Telford Nab, DO  esomeprazole (NEXIUM) 20 MG capsule TAKE ONE CAPSULE BY MOUTH EVERY MORNING BEFORE BREAKFAST 02/13/14  Yes Renato Shin, MD  folic acid (FOLVITE) 1 MG tablet Take 1 mg by mouth daily.   Yes Historical Provider, MD  losartan (COZAAR) 100 MG tablet Take 1 tablet (  100 mg total) by mouth daily. 11/15/13  Yes Renato Shin, MD  mycophenolate (CELLCEPT) 250 MG capsule Take 250 mg by mouth 2 (two) times daily.   Yes Historical Provider, MD  ondansetron (ZOFRAN ODT) 4 MG disintegrating tablet 4mg  ODT q4 hours prn nausea/vomiting Patient taking differently: Take 4 mg by mouth every 8 (eight) hours as needed for nausea.  06/28/14  Yes Sherwood Gambler, MD  potassium chloride (K-DUR) 10 MEQ tablet Take 1 tablet (10 mEq total) by mouth daily. 04/07/14  Yes Renato Shin, MD  predniSONE (DELTASONE) 5 MG tablet Take 5 mg by mouth daily with breakfast.   Yes Historical Provider, MD  senna-docusate (SENOKOT-S) 8.6-50  MG per tablet Take 1 tablet by mouth at bedtime as needed for mild constipation. 06/19/13  Yes Hosie Poisson, MD  sertraline (ZOLOFT) 100 MG tablet Take 1 tablet (100 mg total) by mouth daily. 10/04/14  Yes Renato Shin, MD  solifenacin (VESICARE) 10 MG tablet Take 10 mg by mouth daily.    Yes Historical Provider, MD  tacrolimus (PROGRAF) 1 MG capsule Take 1 mg by mouth 2 (two) times daily.  04/16/14  Yes Historical Provider, MD  warfarin (COUMADIN) 5 MG tablet Take 1 tablet (5 mg total) by mouth as directed. Patient taking differently: Take 7.5 mg by mouth daily at 6 PM.  07/19/14  Yes Lorretta Harp, MD  Incontinence Supplies MISC "pull-up" type, 6 per day 08/10/13   Renato Shin, MD  nystatin (MYCOSTATIN) powder Apply 1 g topically 4 (four) times daily as needed. For yeast under breast    Historical Provider, MD  predniSONE (DELTASONE) 1 MG tablet Take 1 tablet (1 mg total) by mouth daily with breakfast. Patient not taking: Reported on 11/02/2014 06/25/13   Eugenie Filler, MD   BP 153/84 mmHg  Pulse 100  Temp(Src) 98.1 F (36.7 C) (Oral)  Resp 18  SpO2 96% Physical Exam  Constitutional: She is oriented to person, place, and time. She appears well-developed and well-nourished. No distress.  HENT:  Head: Normocephalic and atraumatic.  Nose: Nose normal.  Mouth/Throat: Oropharynx is clear and moist. No oropharyngeal exudate.  Eyes: Conjunctivae and EOM are normal. Pupils are equal, round, and reactive to light. No scleral icterus.  Neck: Normal range of motion. Neck supple. No JVD present. No tracheal deviation present. No thyromegaly present.  Cardiovascular: Regular rhythm and normal heart sounds.  Exam reveals no gallop and no friction rub.   No murmur heard. Tachycardia  Pulmonary/Chest: Effort normal and breath sounds normal. No respiratory distress. She has no wheezes. She exhibits no tenderness.  Abdominal: Soft. Bowel sounds are normal. She exhibits no distension and no mass. There  is tenderness. There is no rebound and no guarding.  Diffuse tenderness to palpation.  Musculoskeletal: Normal range of motion. She exhibits no edema or tenderness.  Lymphadenopathy:    She has no cervical adenopathy.  Neurological: She is alert and oriented to person, place, and time. No cranial nerve deficit. She exhibits normal muscle tone.  Skin: Skin is warm and dry. No rash noted. She is not diaphoretic. No erythema. No pallor.  Nursing note and vitals reviewed.   ED Course  Procedures (including critical care time) Labs Review Labs Reviewed  CBC WITH DIFFERENTIAL/PLATELET - Abnormal; Notable for the following:    WBC 12.2 (*)    Neutrophils Relative % 83 (*)    Neutro Abs 10.1 (*)    All other components within normal limits  COMPREHENSIVE METABOLIC PANEL -  Abnormal; Notable for the following:    CO2 19 (*)    Glucose, Bld 132 (*)    Calcium 8.8 (*)    Total Bilirubin 1.4 (*)    All other components within normal limits  LIPASE, BLOOD - Abnormal; Notable for the following:    Lipase 17 (*)    All other components within normal limits  PROTIME-INR - Abnormal; Notable for the following:    Prothrombin Time 84.1 (*)    INR >10.00 (*)    All other components within normal limits  CBC - Abnormal; Notable for the following:    WBC 11.2 (*)    RBC 3.63 (*)    Hemoglobin 10.7 (*)    HCT 32.5 (*)    All other components within normal limits  URINALYSIS, ROUTINE W REFLEX MICROSCOPIC (NOT AT [facility])  I-STAT CG4 LACTIC ACID, ED    Imaging Review Ct Abdomen Pelvis W Contrast  11/02/2014   CLINICAL DATA:  Acute abdominal pain  EXAM: CT ABDOMEN AND PELVIS WITH CONTRAST  TECHNIQUE: Multidetector CT imaging of the abdomen and pelvis was performed using the standard protocol following bolus administration of intravenous contrast.  CONTRAST:  16mL OMNIPAQUE IOHEXOL 300 MG/ML  SOLN  COMPARISON:  06/28/2014  FINDINGS: BODY WALL: No contributory findings.  LOWER CHEST: Extensive coronary  atherosclerosis for age  ABDOMEN/PELVIS:  Liver: No focal abnormality.  Biliary: Cholecystectomy with chronic intra and extrahepatic biliary duct enlargement  Pancreas: Unremarkable.  Spleen: Single coarse calcification, likely granulomatous.  Adrenals: Unremarkable.  Kidneys and ureters: There is a transplant kidney in the right lower quadrant, already excreting contrast. Would always consult with transplant surgeon prior to administering IV contrast. Severe native renal atrophy with stable low-density renal lesions which are indeterminate due to soft tissue density, but size stable since 2013, 22 mm on the left and 16 mm on the right.  Bladder: Unremarkable.  Reproductive: No pathologic findings.  Bowel: There is severe wall thickening of a jejunum segment with low-density submucosal appearance consistent with edema. The mesentery is diffusely edematous. The associated vessels appear widely patent. There is no evidence of perforation or abscess. No bowel obstruction. Distal colonic diverticulosis. Normal appendix.  Retroperitoneum: No mass or adenopathy.  Peritoneum: Small ascites around the liver and in the pelvis, considered reactive.  Vascular: No acute abnormality.  OSSEOUS: No acute abnormalities.  IMPRESSION: 1. Severe short-segment jejunitis, likely infectious or lupus enteritis (given patient's history). 2. Bilateral native renal lesions are indeterminate due to density, but size stable from 2013. 3. Additional chronic findings are stable from prior noted above.   Electronically Signed   By: Monte Fantasia M.D.   On: 11/02/2014 04:41     EKG Interpretation None      MDM   Final diagnoses:  Abdominal pain, acute   patient since emergency department for severe abdominal pain, nausea and vomiting. Laboratory studies reveal an INR greater than 10. This is likely in the setting of her dehydration and continue to take her Coumadin. She was given oral vitamin K in the emergency department. Abdomen is  very tender, CT scan of the abdomen is warranted to evaluate for any intra-abdominal bleeding. Patient does have history of kidney transplant however I believe the benefits outweighed the risks in this case of possible life-threatening bleed. Patient was given morphine and Zofran for symptomatically control. Also 1 L of IV fluids. Repeat 3 hour CBC is pending.  CT scan reveals jejunitis.  Repeat hgb is down 3 units to  10.  May be dilutional from IVF but I only gave 1 L.  Will admit to [facility] for management of INR and observation of hgb.  Everlene Balls, MD 11/02/14 [phone]

## 2014-11-03 LAB — URINE MICROSCOPIC-ADD ON

## 2014-11-03 LAB — URINALYSIS, ROUTINE W REFLEX MICROSCOPIC
Glucose, UA: NEGATIVE mg/dL
Hgb urine dipstick: NEGATIVE
Ketones, ur: 15 mg/dL — AB
Nitrite: POSITIVE — AB
Protein, ur: NEGATIVE mg/dL
Specific Gravity, Urine: 1.028 (ref 1.005–1.030)
Urobilinogen, UA: 0.2 mg/dL (ref 0.0–1.0)
pH: 6 (ref 5.0–8.0)

## 2014-11-03 LAB — CBC
HCT: 25.8 % — ABNORMAL LOW (ref 36.0–46.0)
Hemoglobin: 8.5 g/dL — ABNORMAL LOW (ref 12.0–15.0)
MCH: 29.8 pg (ref 26.0–34.0)
MCHC: 32.9 g/dL (ref 30.0–36.0)
MCV: 90.5 fL (ref 78.0–100.0)
Platelets: 163 10*3/uL (ref 150–400)
RBC: 2.85 MIL/uL — ABNORMAL LOW (ref 3.87–5.11)
RDW: 14.3 % (ref 11.5–15.5)
WBC: 13.2 10*3/uL — ABNORMAL HIGH (ref 4.0–10.5)

## 2014-11-03 LAB — BASIC METABOLIC PANEL
Anion gap: 8 (ref 5–15)
BUN: 29 mg/dL — ABNORMAL HIGH (ref 6–20)
CO2: 20 mmol/L — ABNORMAL LOW (ref 22–32)
Calcium: 8.4 mg/dL — ABNORMAL LOW (ref 8.9–10.3)
Chloride: 110 mmol/L (ref 101–111)
Creatinine, Ser: 0.83 mg/dL (ref 0.44–1.00)
GFR calc Af Amer: >60 mL/min (ref >60)
GFR calc non Af Amer: >60 mL/min (ref >60)
Glucose, Bld: 129 mg/dL — ABNORMAL HIGH (ref 65–99)
Potassium: 3.4 mmol/L — ABNORMAL LOW (ref 3.5–5.1)
Sodium: 138 mmol/L (ref 135–145)

## 2014-11-03 LAB — PROTIME-INR
INR: 1.58 — ABNORMAL HIGH (ref 0.00–1.49)
Prothrombin Time: 18.9 seconds — ABNORMAL HIGH (ref 11.6–15.2)

## 2014-11-03 MED ORDER — WARFARIN - PHARMACIST DOSING INPATIENT: Freq: Every day | Status: DC

## 2014-11-03 MED ORDER — LABETALOL HCL 5 MG/ML IV SOLN
5.0000 mg | Freq: Once | INTRAVENOUS | Status: AC
  Administered 2014-11-03: 5 mg via INTRAVENOUS
  Filled 2014-11-03: qty 4

## 2014-11-03 MED ORDER — POTASSIUM CHLORIDE CRYS ER 20 MEQ PO TBCR
40.0000 meq | EXTENDED_RELEASE_TABLET | Freq: Once | ORAL | Status: AC
  Administered 2014-11-03: 40 meq via ORAL
  Filled 2014-11-03: qty 2

## 2014-11-03 MED ORDER — WARFARIN SODIUM 7.5 MG PO TABS
7.5000 mg | ORAL_TABLET | ORAL | Status: AC
Start: 2014-11-03 — End: 2014-11-03
  Administered 2014-11-03: 7.5 mg via ORAL
  Filled 2014-11-03: qty 1

## 2014-11-03 NOTE — Progress Notes (Signed)
ANTICOAGULATION CONSULT NOTE - Follow Up Consult  Pharmacy Consult for warfarin Indication: DVT and stroke  Allergies  Allergen Reactions  . Oxycodone-Acetaminophen Shortness Of Breath and Nausea Only  . Propoxyphene N-Acetaminophen Shortness Of Breath and Nausea Only  . Sulfonamide Derivatives Shortness Of Breath and Nausea Only  . Codeine Nausea Only  . Latex Rash  . Metoprolol Rash    Patient Measurements: Height: 5\' 4"  (162.6 cm) Weight: 181 lb 14.1 oz (82.5 kg) IBW/kg (Calculated) : 54.7 Heparin Dosing Weight:   Vital Signs: Temp: 98.9 F (37.2 C) (05/29 0545) Temp Source: Oral (05/29 0545) BP: 97/59 mmHg (05/29 0545) Pulse Rate: 117 (05/29 0545)  Labs:  Recent Labs  11/02/14 0023 11/02/14 [id number] 11/02/14 0639 11/02/14 1232 11/03/14 0510  HGB 13.7 10.7* 11.4*  --  8.5*  HCT 41.4 32.5* 35.5*  --  25.8*  PLT 184 155 165  --  163  LABPROT 84.1*  --  >90.0* >90.0* 18.9*  INR >10.00*  --  >10.00* >10.00* 1.58*  CREATININE 0.63  --  0.59  --  0.83    Estimated Creatinine Clearance: 80.5 mL/min (by C-G formula based on Cr of 0.83).   Medications:  Prescriptions prior to admission  Medication Sig Dispense Refill Last Dose  . atorvastatin (LIPITOR) 40 MG tablet Take 1 tablet (40 mg total) by mouth daily. 90 tablet 3 11/01/2014 at Unknown time  . calcitRIOL (ROCALTROL) 0.25 MCG capsule TAKE ONE CAPSULE BY MOUTH DAILY 30 capsule 0 11/01/2014 at Unknown time  . diltiazem (TIAZAC) 240 MG 24 hr capsule Take 1 capsule (240 mg total) by mouth daily. 30 capsule 11 11/01/2014 at Unknown time  . donepezil (ARICEPT) 5 MG tablet Take 1 tablet (5 mg total) by mouth at bedtime. 30 tablet 0 11/01/2014 at Unknown time  . esomeprazole (NEXIUM) 20 MG capsule TAKE ONE CAPSULE BY MOUTH EVERY MORNING BEFORE BREAKFAST 90 capsule 0 11/01/2014 at Unknown time  . folic acid (FOLVITE) 1 MG tablet Take 1 mg by mouth daily.   11/01/2014 at Unknown time  . losartan (COZAAR) 100 MG tablet Take 1  tablet (100 mg total) by mouth daily. 30 tablet 11 11/01/2014 at Unknown time  . mycophenolate (CELLCEPT) 250 MG capsule Take 250 mg by mouth 2 (two) times daily.   11/01/2014 at Unknown time  . ondansetron (ZOFRAN ODT) 4 MG disintegrating tablet 4mg  ODT q4 hours prn nausea/vomiting (Patient taking differently: Take 4 mg by mouth every 8 (eight) hours as needed for nausea. ) 15 tablet 0 Past Week at Unknown time  . potassium chloride (K-DUR) 10 MEQ tablet Take 1 tablet (10 mEq total) by mouth daily. 30 tablet 5 11/01/2014 at Unknown time  . predniSONE (DELTASONE) 5 MG tablet Take 5 mg by mouth daily with breakfast.   11/01/2014 at Unknown time  . senna-docusate (SENOKOT-S) 8.6-50 MG per tablet Take 1 tablet by mouth at bedtime as needed for mild constipation.   11/01/2014 at Unknown time  . sertraline (ZOLOFT) 100 MG tablet Take 1 tablet (100 mg total) by mouth daily. 30 tablet 11 11/01/2014 at Unknown time  . solifenacin (VESICARE) 10 MG tablet Take 10 mg by mouth daily.    11/01/2014 at Unknown time  . tacrolimus (PROGRAF) 1 MG capsule Take 1 mg by mouth 2 (two) times daily.    11/01/2014 at Unknown time  . warfarin (COUMADIN) 5 MG tablet Take 1 tablet (5 mg total) by mouth as directed. (Patient taking differently: Take 7.5 mg by mouth daily  at 6 PM. ) 40 tablet 0 11/01/2014 at Unknown time  . Incontinence Supplies MISC "pull-up" type, 6 per day 200 each 11 Taking  . nystatin (MYCOSTATIN) powder Apply 1 g topically 4 (four) times daily as needed. For yeast under breast   unknown at unknown  . predniSONE (DELTASONE) 1 MG tablet Take 1 tablet (1 mg total) by mouth daily with breakfast. (Patient not taking: Reported on 11/02/2014)   Taking   Scheduled:  . atorvastatin  40 mg Oral q1800  . calcitRIOL  0.25 mcg Oral Daily  . cefTRIAXone (ROCEPHIN)  IV  2 g Intravenous Q0600  . darifenacin  7.5 mg Oral Daily  . diltiazem  240 mg Oral Daily  . donepezil  5 mg Oral QHS  . folic acid  1 mg Oral Daily  .  losartan  100 mg Oral Daily  . metronidazole  500 mg Intravenous 3 times per day  . mycophenolate  250 mg Oral BID  . pantoprazole (PROTONIX) IV  40 mg Intravenous Q12H  . predniSONE  5 mg Oral Q breakfast  . sertraline  100 mg Oral Daily  . sodium chloride  3 mL Intravenous Q12H  . tacrolimus  1 mg Oral BID  . warfarin  7.5 mg Oral NOW  . Warfarin - Pharmacist Dosing Inpatient   Does not apply q1800    Assessment: Patient with chronic warfarin for hx of DVT/stroke had INR > 10 on admission.  Vitamin K/FFP given, now INR <2 this AM.    Goal of Therapy:  INR 2-3    Plan:  Warfarin 7.5mg  po x1 now Daily INR  Connie Ruiz 11/03/2014,6:05 AM

## 2014-11-03 NOTE — Progress Notes (Signed)
Patient ID: Connie Ruiz, female   DOB: 01-10-1961, 54 y.o.   MRN: [id number]  [facility] PROGRESS NOTE  Connie Ruiz [id number] DOB: February 01, 1961 DOA: 11/02/2014 PCP: Renato Shin, MD   Brief narrative:    Pt admitted with abd pain, CT abd notable for jejunitis. Started on Rocephin and Flagyl, [facility] asked to admit for further evaluation.  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5 this AM - appreciate pharmacy assistance   Obesity  - Body mass index is 31.2 kg/(m^2).  DVT prophylaxis - pt is already on Coumadin   Code Status: Full.  Family Communication:  plan of care discussed with the patient Disposition Plan: Home when stable.   IV access:  Peripheral IV  Procedures and diagnostic studies:    Ct Abdomen Pelvis W Contrast 11/02/2014  Severe short-segment jejunitis, likely infectious or lupus enteritis (given patient's history). 2. Bilateral native renal lesions are indeterminate due to  density, but size stable from 2013. 3. Additional chronic findings are stable from prior noted above.    Medical Consultants:  None   Other Consultants:  None  IAnti-Infectives:   Rocephin 5/28 --> Flagyl 5/28 -->  Faye Ramsay, MD  [facility] Pager [phone]  If 7PM-7AM, please contact night-coverage [email or web address] Password [facility] 11/03/2014, 6:49 AM   LOS: 1 day   HPI/Subjective: No events overnight. Still with abd pain, 4/10 in severity.  Objective: Filed Vitals:   11/02/14 1406 11/02/14 2054 11/03/14 0040 11/03/14 0545  BP: 151/79 99/83 119/71 97/59  Pulse: 120 132  117  Temp: 98.7 F (37.1 C) 98.4 F (36.9 C)  98.9 F (37.2 C)  TempSrc: Oral Oral  Oral  Resp: 20 20  20   Height:      Weight:    82.5 kg (181 lb 14.1 oz)  SpO2: 99% 96%  100%    Intake/Output Summary (Last 24 hours) at 11/03/14 [id number] Last data filed at 11/03/14 0300  Gross per 24 hour  Intake    940 ml  Output    202 ml  Net    738 ml    Exam:   General:  Pt is alert, follows commands appropriately, not in acute distress  Cardiovascular: Regular rhythm, tachycardic no rubs, no gallops  Respiratory: Clear to auscultation bilaterally, no wheezing, no crackles, no rhonchi  Abdomen: Soft, tender in epigastric area, non distended, bowel sounds present, no guarding  Extremities:  No edema, pulses DP and PT palpable bilaterally  Neuro: Grossly nonfocal  Data Reviewed: Basic Metabolic Panel:  Recent Labs Lab 11/02/14 0023 11/02/14 0639 11/03/14 0510  NA 137 136 138  K 3.8 3.5 3.4*  CL 104 103 110  CO2 19* 18* 20*  GLUCOSE 132* 122* 129*  BUN 18 17 29*  CREATININE 0.63 0.59 0.83  CALCIUM 8.8* 8.5* 8.4*   Liver Function Tests:  Recent Labs Lab 11/02/14 0023 11/02/14 0639  AST 31 24  ALT 30 26  ALKPHOS 79 70  BILITOT 1.4* 1.0  PROT 7.0 6.0*  ALBUMIN 3.7 3.2*    Recent Labs Lab 11/02/14 0023  LIPASE 17*   CBC:  Recent Labs Lab 11/02/14 0023 11/02/14 0337  11/02/14 0639 11/03/14 0510  WBC 12.2* 11.2* 13.4* 13.2*  NEUTROABS 10.1*  --  10.4*  --   HGB 13.7 10.7* 11.4* 8.5*  HCT 41.4 32.5* 35.5* 25.8*  MCV 89.2 89.5 89.9 90.5  PLT 184 155 165 163   Scheduled Meds: . atorvastatin  40 mg Oral q1800  . calcitRIOL  0.25 mcg Oral Daily  . cefTRIAXone (ROCEPHIN)  IV  2 g Intravenous Q0600  . darifenacin  7.5 mg Oral Daily  . diltiazem  240 mg Oral Daily  . donepezil  5 mg Oral QHS  . folic acid  1 mg Oral Daily  . losartan  100 mg Oral Daily  . metronidazole  500 mg Intravenous 3 times per day  . mycophenolate  250 mg Oral BID  . pantoprazole (PROTONIX) IV  40 mg Intravenous Q12H  . predniSONE  5 mg Oral Q breakfast  . sertraline  100 mg Oral Daily  . sodium chloride  3 mL Intravenous Q12H  . tacrolimus  1 mg Oral BID  . Warfarin - Pharmacist Dosing Inpatient   Does not apply q1800   Continuous Infusions: . sodium chloride 75 mL/hr at 11/03/14 0102

## 2014-11-04 ENCOUNTER — Inpatient Hospital Stay (HOSPITAL_COMMUNITY): Payer: Medicare Other

## 2014-11-04 ENCOUNTER — Encounter (HOSPITAL_COMMUNITY): Payer: Self-pay | Admitting: Radiology

## 2014-11-04 LAB — CBC
HCT: 24.1 % — ABNORMAL LOW (ref 36.0–46.0)
Hemoglobin: 7.8 g/dL — ABNORMAL LOW (ref 12.0–15.0)
MCH: 29.9 pg (ref 26.0–34.0)
MCHC: 32.4 g/dL (ref 30.0–36.0)
MCV: 92.3 fL (ref 78.0–100.0)
Platelets: 158 10*3/uL (ref 150–400)
RBC: 2.61 MIL/uL — ABNORMAL LOW (ref 3.87–5.11)
RDW: 14.5 % (ref 11.5–15.5)
WBC: 12.3 10*3/uL — ABNORMAL HIGH (ref 4.0–10.5)

## 2014-11-04 LAB — BASIC METABOLIC PANEL
Anion gap: 7 (ref 5–15)
BUN: 20 mg/dL (ref 6–20)
CO2: 20 mmol/L — ABNORMAL LOW (ref 22–32)
Calcium: 8.2 mg/dL — ABNORMAL LOW (ref 8.9–10.3)
Chloride: 111 mmol/L (ref 101–111)
Creatinine, Ser: 0.72 mg/dL (ref 0.44–1.00)
GFR calc Af Amer: >60 mL/min (ref >60)
GFR calc non Af Amer: >60 mL/min (ref >60)
Glucose, Bld: 106 mg/dL — ABNORMAL HIGH (ref 65–99)
Potassium: 3.5 mmol/L (ref 3.5–5.1)
Sodium: 138 mmol/L (ref 135–145)

## 2014-11-04 LAB — PROTIME-INR
INR: 1.84 — ABNORMAL HIGH (ref 0.00–1.49)
Prothrombin Time: 21.2 seconds — ABNORMAL HIGH (ref 11.6–15.2)

## 2014-11-04 MED ORDER — WARFARIN SODIUM 5 MG PO TABS
5.0000 mg | ORAL_TABLET | Freq: Once | ORAL | Status: AC
  Administered 2014-11-04: 5 mg via ORAL
  Filled 2014-11-04: qty 1

## 2014-11-04 MED ORDER — IOHEXOL 300 MG/ML  SOLN
100.0000 mL | Freq: Once | INTRAMUSCULAR | Status: AC | PRN
  Administered 2014-11-04: 100 mL via INTRAVENOUS

## 2014-11-04 MED ORDER — SODIUM CHLORIDE 0.9 % IV SOLN: Freq: Once | INTRAVENOUS | Status: DC

## 2014-11-04 MED ORDER — HYDROMORPHONE HCL 1 MG/ML IJ SOLN
1.0000 mg | INTRAMUSCULAR | Status: DC | PRN
  Administered 2014-11-04 (×2): 1 mg via INTRAVENOUS
  Filled 2014-11-04 (×3): qty 1

## 2014-11-04 MED ORDER — IOHEXOL 300 MG/ML  SOLN: 25.0000 mL | INTRAMUSCULAR | Status: AC

## 2014-11-04 NOTE — Progress Notes (Signed)
ANTICOAGULATION CONSULT NOTE - Follow Up Consult  Pharmacy Consult for warfarin Indication: DVT and stroke  Allergies  Allergen Reactions  . Oxycodone-Acetaminophen Shortness Of Breath and Nausea Only  . Propoxyphene N-Acetaminophen Shortness Of Breath and Nausea Only  . Sulfonamide Derivatives Shortness Of Breath and Nausea Only  . Codeine Nausea Only  . Latex Rash  . Metoprolol Rash   Patient Measurements: Height: 5\' 4"  (162.6 cm) Weight: 185 lb 10 oz (84.2 kg) IBW/kg (Calculated) : 54.7  Vital Signs: Temp: 99.1 F (37.3 C) (05/30 0458) Temp Source: Oral (05/30 0458) BP: 124/53 mmHg (05/30 0458) Pulse Rate: 107 (05/30 0458)  Labs:  Recent Labs  11/02/14 0639 11/02/14 1232 11/03/14 0510 11/04/14 0446  HGB 11.4*  --  8.5* 7.8*  HCT 35.5*  --  25.8* 24.1*  PLT 165  --  163 158  LABPROT >90.0* >90.0* 18.9* 21.2*  INR >10.00* >10.00* 1.58* 1.84*  CREATININE 0.59  --  0.83 0.72   Estimated Creatinine Clearance: 84.4 mL/min (by C-G formula based on Cr of 0.72).  Medications:  Scheduled:  . atorvastatin  40 mg Oral q1800  . calcitRIOL  0.25 mcg Oral Daily  . cefTRIAXone (ROCEPHIN)  IV  2 g Intravenous Q0600  . darifenacin  7.5 mg Oral Daily  . diltiazem  240 mg Oral Daily  . donepezil  5 mg Oral QHS  . folic acid  1 mg Oral Daily  . metronidazole  500 mg Intravenous 3 times per day  . mycophenolate  250 mg Oral BID  . pantoprazole (PROTONIX) IV  40 mg Intravenous Q12H  . predniSONE  5 mg Oral Q breakfast  . sertraline  100 mg Oral Daily  . sodium chloride  3 mL Intravenous Q12H  . tacrolimus  1 mg Oral BID  . Warfarin - Pharmacist Dosing Inpatient   Does not apply q1800   Assessment:  20 yoF admitted with c/o diffuse abd pain, N/V. Hx of PE on chronic Warfarin, home dose 7.5mg  daily with last dose 5/27. Jejunitis on CT, treating with Cipro/Flagyl. Other hx: Lupus, Renal transplant on immunomodulators. Admit INR > 10, treated with Vit K 5mg  po and IV + FFP.   Warfarin resumed 5/29 with INR 1.58.  Today 11/04/2014  INR 1.84 after 7.5mg  Warfarin  Flagyl can increase INR, poor po intake  Goal of Therapy:  INR 2-3    Plan:   Warfarin 5mg  today at 1800  Daily INR  [name] PharmD Pager [phone] 11/04/2014, 7:44 AM

## 2014-11-04 NOTE — Progress Notes (Signed)
Patient ID: Connie Ruiz, female   DOB: 10/24/1960, 54 y.o.   MRN: [id number]  [facility] PROGRESS NOTE  Connie Ruiz [id number] DOB: 06-Aug-1960 DOA: 11/02/2014 PCP: Renato Shin, MD   Brief narrative:    Pt admitted with abd pain, CT abd notable for jejunitis. Started on Rocephin and Flagyl, [facility] asked to admit for further evaluation.  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 transplant - continue mycophenolate and prednisone per home medical regimen  - continue tacrolimus    DVT (deep venous thrombosis) - with initially supra therapeutic INR, now stabilizing - Coumadin per pharmacy   Anemia of chronic disease, lupus and IDA - drop in Hg since admission likely dilutional from IVF but also possible blood in stool  - FOBT requested - no sings of active bleeding, transfuse one unit PRBC 5/30 - repeat CBC in AM   Depression - no SI this AM but still with flat affect - monitor    Hypokalemia - supplemented and WNL this AM   Supratherapeutic INR - given 2 doses of vit K 5 mg IV, INR 1.8 this AM - appreciate pharmacy assistance   Obesity  - Body mass index is 31.2 kg/(m^2).  DVT prophylaxis - pt is already on Coumadin   Code Status: Full.  Family Communication:  plan of care discussed with the patient Disposition Plan: Home when stable.   IV access:  Peripheral IV  Procedures and diagnostic studies:    Ct Abdomen Pelvis W Contrast 11/02/2014  Severe short-segment jejunitis, likely infectious or lupus enteritis (given  patient's history). 2. Bilateral native renal lesions are indeterminate due to density, but size stable from 2013. 3. Additional chronic findings are stable from prior noted above.    Medical Consultants:  None   Other Consultants:  None  IAnti-Infectives:   Rocephin 5/28 --> Flagyl 5/28 -->  Faye Ramsay, MD  [facility] Pager [phone]  If 7PM-7AM, please contact night-coverage [email or web address] Password [email or web address] 11/04/2014, 1:21 PM   LOS: 2 days   HPI/Subjective: No events overnight. Still with abd pain, 5/10 in severity.  Objective: Filed Vitals:   11/03/14 1534 11/03/14 2129 11/04/14 0458 11/04/14 0945  BP: 122/56 139/59 124/53 130/58  Pulse: 105 100 107   Temp: 97.4 F (36.3 C) 98.7 F (37.1 C) 99.1 F (37.3 C)   TempSrc: Axillary Oral Oral   Resp: 20 20 18    Height:      Weight:   84.2 kg (185 lb 10 oz)   SpO2: 95% 92% 92%     Intake/Output Summary (Last 24 hours) at 11/04/14 1321 Last data filed at 11/04/14 0700  Gross per 24 hour  Intake   3305 ml  Output      0 ml  Net   3305 ml    Exam:   General:  Pt is alert, follows commands appropriately, not in acute distress  Cardiovascular: Regular rhythm, tachycardic no rubs, no gallops  Respiratory: Clear to auscultation bilaterally, no wheezing, no crackles, no  rhonchi  Abdomen: Soft, tender in epigastric area, non distended, bowel sounds present, no guarding  Extremities: No edema, pulses DP and PT palpable bilaterally  Neuro: Grossly nonfocal  Data Reviewed: Basic Metabolic Panel:  Recent Labs Lab 11/02/14 0023 11/02/14 0639 11/03/14 0510 11/04/14 0446  NA 137 136 138 138  K 3.8 3.5 3.4* 3.5  CL 104 103 110 111  CO2 19* 18* 20* 20*  GLUCOSE 132* 122* 129* 106*  BUN 18 17 29* 20  CREATININE 0.63 0.59 0.83 0.72  CALCIUM 8.8* 8.5* 8.4* 8.2*   Liver Function Tests:  Recent Labs Lab 11/02/14 0023 11/02/14 0639  AST 31 24  ALT 30 26  ALKPHOS 79 70  BILITOT 1.4* 1.0  PROT 7.0 6.0*   ALBUMIN 3.7 3.2*    Recent Labs Lab 11/02/14 0023  LIPASE 17*   CBC:  Recent Labs Lab 11/02/14 0023 11/02/14 0337 11/02/14 0639 11/03/14 0510 11/04/14 0446  WBC 12.2* 11.2* 13.4* 13.2* 12.3*  NEUTROABS 10.1*  --  10.4*  --   --   HGB 13.7 10.7* 11.4* 8.5* 7.8*  HCT 41.4 32.5* 35.5* 25.8* 24.1*  MCV 89.2 89.5 89.9 90.5 92.3  PLT 184 155 165 163 158   Scheduled Meds: . atorvastatin  40 mg Oral q1800  . calcitRIOL  0.25 mcg Oral Daily  . cefTRIAXone (ROCEPHIN)  IV  2 g Intravenous Q0600  . darifenacin  7.5 mg Oral Daily  . diltiazem  240 mg Oral Daily  . donepezil  5 mg Oral QHS  . folic acid  1 mg Oral Daily  . metronidazole  500 mg Intravenous 3 times per day  . mycophenolate  250 mg Oral BID  . pantoprazole (PROTONIX) IV  40 mg Intravenous Q12H  . predniSONE  5 mg Oral Q breakfast  . sertraline  100 mg Oral Daily  . sodium chloride  3 mL Intravenous Q12H  . tacrolimus  1 mg Oral BID  . warfarin  5 mg Oral ONCE-1800  . Warfarin - Pharmacist Dosing Inpatient   Does not apply q1800   Continuous Infusions: . sodium chloride 75 mL/hr at 11/04/14 0945

## 2014-11-04 NOTE — Care Management Note (Signed)
Case Management Note  Patient Details  Name: Connie Ruiz MRN: [id number] Date of Birth: 09/28/1960  Subjective/Objective:54 y/o f admitted w/Jejunitis.From home.                    Action/Plan:No anticipated d/c needs.   Expected Discharge Date:  11/05/14               Expected Discharge Plan:  Home/Self Care  In-House Referral:     Discharge planning Services  CM Consult  Post Acute Care Choice:    Choice offered to:     DME Arranged:    DME Agency:     HH Arranged:    HH Agency:     Status of Service:  In process, will continue to follow  Medicare Important Message Given:    Date Medicare IM Given:    Medicare IM give by:    Date Additional Medicare IM Given:    Additional Medicare Important Message give by:     If discussed at [place] of Stay Meetings, dates discussed:    Additional Comments:  Dessa Phi, RN 11/04/2014, 12:32 PM

## 2014-11-05 DIAGNOSIS — K5289 Other specified noninfective gastroenteritis and colitis: Secondary | ICD-10-CM

## 2014-11-05 DIAGNOSIS — R933 Abnormal findings on diagnostic imaging of other parts of digestive tract: Secondary | ICD-10-CM

## 2014-11-05 LAB — CBC
HCT: 24.8 % — ABNORMAL LOW (ref 36.0–46.0)
Hemoglobin: 7.9 g/dL — ABNORMAL LOW (ref 12.0–15.0)
MCH: 29.3 pg (ref 26.0–34.0)
MCHC: 31.9 g/dL (ref 30.0–36.0)
MCV: 91.9 fL (ref 78.0–100.0)
Platelets: 163 10*3/uL (ref 150–400)
RBC: 2.7 MIL/uL — ABNORMAL LOW (ref 3.87–5.11)
RDW: 14.6 % (ref 11.5–15.5)
WBC: 10.6 10*3/uL — ABNORMAL HIGH (ref 4.0–10.5)

## 2014-11-05 LAB — SEDIMENTATION RATE: Sed Rate: 65 mm/hr — ABNORMAL HIGH (ref 0–22)

## 2014-11-05 LAB — PROTIME-INR
INR: 2.48 — ABNORMAL HIGH (ref 0.00–1.49)
Prothrombin Time: 26.6 seconds — ABNORMAL HIGH (ref 11.6–15.2)

## 2014-11-05 LAB — CLOSTRIDIUM DIFFICILE BY PCR: Toxigenic C. Difficile by PCR: NEGATIVE

## 2014-11-05 LAB — BASIC METABOLIC PANEL
Anion gap: 10 (ref 5–15)
BUN: 9 mg/dL (ref 6–20)
CO2: 22 mmol/L (ref 22–32)
Calcium: 8.1 mg/dL — ABNORMAL LOW (ref 8.9–10.3)
Chloride: 107 mmol/L (ref 101–111)
Creatinine, Ser: 0.63 mg/dL (ref 0.44–1.00)
GFR calc Af Amer: >60 mL/min (ref >60)
GFR calc non Af Amer: >60 mL/min (ref >60)
Glucose, Bld: 78 mg/dL (ref 65–99)
Potassium: 2.9 mmol/L — ABNORMAL LOW (ref 3.5–5.1)
Sodium: 139 mmol/L (ref 135–145)

## 2014-11-05 MED ORDER — MORPHINE SULFATE 2 MG/ML IJ SOLN
2.0000 mg | INTRAMUSCULAR | Status: DC | PRN
  Administered 2014-11-05 – 2014-11-06 (×5): 2 mg via INTRAVENOUS
  Administered 2014-11-06: 1 mg via INTRAVENOUS
  Administered 2014-11-06 – 2014-11-14 (×16): 2 mg via INTRAVENOUS
  Filled 2014-11-05 (×23): qty 1

## 2014-11-05 MED ORDER — POTASSIUM CHLORIDE CRYS ER 20 MEQ PO TBCR
40.0000 meq | EXTENDED_RELEASE_TABLET | Freq: Two times a day (BID) | ORAL | Status: DC
  Administered 2014-11-05 – 2014-11-14 (×11): 40 meq via ORAL
  Filled 2014-11-05 (×16): qty 2

## 2014-11-05 NOTE — Progress Notes (Addendum)
ANTICOAGULATION CONSULT NOTE - Follow Up Consult  Pharmacy Consult for warfarin Indication: DVT and stroke  Allergies  Allergen Reactions  . Oxycodone-Acetaminophen Shortness Of Breath and Nausea Only  . Propoxyphene N-Acetaminophen Shortness Of Breath and Nausea Only  . Sulfonamide Derivatives Shortness Of Breath and Nausea Only  . Codeine Nausea Only  . Latex Rash  . Metoprolol Rash   Patient Measurements: Height: 5\' 4"  (162.6 cm) Weight: 190 lb 11.2 oz (86.5 kg) IBW/kg (Calculated) : 54.7  Vital Signs: Temp: 98.7 F (37.1 C) (05/31 0532) Temp Source: Oral (05/31 0532) BP: 146/81 mmHg (05/31 0532) Pulse Rate: 104 (05/31 0532)  Labs:  Recent Labs  11/03/14 0510 11/04/14 0446 11/05/14 0410  HGB 8.5* 7.8* 7.9*  HCT 25.8* 24.1* 24.8*  PLT 163 158 163  LABPROT 18.9* 21.2* 26.6*  INR 1.58* 1.84* 2.48*  CREATININE 0.83 0.72 0.63   Estimated Creatinine Clearance: 85.5 mL/min (by C-G formula based on Cr of 0.63).  Medications:  Scheduled:  . sodium chloride   Intravenous Once  . atorvastatin  40 mg Oral q1800  . calcitRIOL  0.25 mcg Oral Daily  . cefTRIAXone (ROCEPHIN)  IV  2 g Intravenous Q0600  . darifenacin  7.5 mg Oral Daily  . diltiazem  240 mg Oral Daily  . donepezil  5 mg Oral QHS  . folic acid  1 mg Oral Daily  . metronidazole  500 mg Intravenous 3 times per day  . mycophenolate  250 mg Oral BID  . pantoprazole (PROTONIX) IV  40 mg Intravenous Q12H  . potassium chloride  40 mEq Oral BID  . predniSONE  5 mg Oral Q breakfast  . sertraline  100 mg Oral Daily  . sodium chloride  3 mL Intravenous Q12H  . tacrolimus  1 mg Oral BID  . Warfarin - Pharmacist Dosing Inpatient   Does not apply q1800   Assessment:  13 yoF admitted with c/o diffuse abd pain, N/V. 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 Jejunitis on CT, treating with Cipro/Flagyl. Other hx: Lupus, Renal transplant on immunomodulators. INR > 10 on admission, treated  with IV/PO Vit K + FFP.  Warfarin resumed 5/29 with INR 1.58.  Today 11/05/2014  INR 2.48 after warfarin x 2 doses  Hgb low but same as yesterday, Plt wnl  Flagyl can increase INR, poor po intake  No bleeding documented  Per GI note today: "Will consider enteroscopy for evaluation of small bowel tomorrow if INR closer to normal range. Will need to stop warfarin today and let INR drift to 1.6 or lower"  Goal of Therapy:  INR 2-3   Plan:   No warfarin tonight.  Consider Vitamin K PO if GI procedure needed tomorrow  Daily INR  CBC at least q 72 hr while on warfarin  Monitor for signs of bleeding or thrombosis  Reuel Boom, PharmD Pager: [phone] 11/05/2014, 11:47 AM

## 2014-11-05 NOTE — Plan of Care (Deleted)
Problem: Phase III Progression Outcomes Goal: Foley discontinued Outcome: Not Met (add Reason) Chronic foley     

## 2014-11-05 NOTE — Progress Notes (Addendum)
Patient ID: Connie Ruiz, female   DOB: 06/18/1960, 54 y.o.   MRN: [id number]  [facility] PROGRESS NOTE  MAYURI SHENTON [id number] DOB: [birth year] DOA: 11/02/2014 PCP: Renato Shin, MD   Brief narrative:    Pt is 54 yo female with complicated medical history including HTN, DM, lupus and antiphospholipid syndrome, s/p renal transplant requiring long term immunosuppressant, CVA in 2011, PE in 07/2010 and requiring long term AC with Coumadin, last known colonoscopy in [place] in 2002 which was reportedly unremarkable, admitted with main concern of several days duration of intense and epigastric abd pain, associated with nausea and several episodes of non bloody vomiting, poor oral intake. CT abd notable for jejunitis. Started on Rocephin and Flagyl, [facility] asked to admit for further evaluation.  Hospital course has been complicated by persistent abd pain despite IV ABX regimen. Repeat CT abd and pelvis on 5/30 with potentially worsening diverticulitis and GI team has been consulted for further assistance.   Assessment/Plan:    Principal Problem:   Acute abd pain secondary to jejunitis - pt still reports pain 5/10 in severity this AM  - CT abd and pelvis repeat on 5/30 and suggestive of somewhat worsening enteritis - this is potentially due to lupus vs infectious in etiology  - GI team has been consulted as pt is currently on day #4 ABX Rocephin and Flagyl  - will continue same ABX for now, continue IVF, keep NPO - per GI team, may need enteroscopy for evaluation of small bowel - Coumadin will be on hold in an anticipation of potential intervention, goal INR < 1.6 for intervention purpose   Active Problems:   Essential hypertension - SBP in 130's - continue Cardizem for now   LUPUS with complications of lupus nephritis and s/p kidney transplant - continue mycophenolate and prednisone per home medical regimen  - continue tacrolimus  - Cr remains stable and WNL    DVT (deep venous  thrombosis) - with initially supra therapeutic INR, now stabilizing - Coumadin will be on hold in an anticipation of intervention (enteroscopy per GI team) as noted above    Anemia of chronic disease, lupus and IDA - drop in Hg since admission likely dilutional from IVF but also possible blood in stool  - FOBT requested - no sings of active bleeding, please note that pt is [name] and declines blood products for support  - repeat CBC in AM   Depression - no SI this AM but still with flat affect - monitor    Hypokalemia - supplement, check Mg and BMP in AM - no events on telemetry, d/c tele monitoring today 5/31   Supratherapeutic INR - given 2 doses of vit K 5 mg IV on admision - appreciate pharmacy assistance - need to hold Coumadin today as noted above    Obesity  - Body mass index is 31.2 kg/(m^2).  DVT prophylaxis - pt is already on Coumadin but need to hold today   Code Status: Full.  Family Communication:  plan of care discussed with the patient Disposition Plan: Barrier to discharge - still with abd pain, CT abd with worsening enteritis, GI consulted, needs close monitoring, possible intervention enteroscopy in AM  IV access:  Peripheral IV  Procedures and diagnostic studies:    Ct Abdomen Pelvis W Contrast 11/02/2014  Severe short-segment jejunitis, likely infectious or lupus enteritis (given patient's history). 2. Bilateral native renal lesions are indeterminate due to density, but size stable from 2013. 3. Additional chronic  findings are stable from prior noted above.    Ct Abdomen Pelvis W Contrast 11/04/2014   There is slight worsening in segmental inflammatory changes in jejunum in left abdomen. Slight worsening of thickening of the jejunal wall up to 1.1 cm. Again findings consistent with segmental enteritis. This may be infectious in nature or due to lupus enteritis 2. Mild increased perihepatic ascites. 3. There is small right pleural effusion with right  lower lobe posterior atelectasis. 4. There is significant gaseous distension of the cecum. Measures up to 6 cm. There is mild angulation and anterior position of the cecum . Findings suspicious for mobile cecum. There is no thickening of cecal wall to suggest cecal volvulus. There is no evidence of obstruction. Cecal ileus cannot be excluded. 5. Normal appendix. No colonic obstruction. Contrast material is noted throughout the colon. 6. Small amount of pelvic ascites. 7. Normal function and appearance of transplanted kidney. Again noted atrophic native kidneys.    Medical Consultants:  GI  Other Consultants:  None  IAnti-Infectives:   Rocephin 5/28 --> Flagyl 5/28 -->  Faye Ramsay, MD  [facility] Pager [phone]  If 7PM-7AM, please contact night-coverage [email or web address] Password [email or web address] 11/05/2014, 6:41 PM   LOS: 3 days   HPI/Subjective: No events overnight. Still with abd pain, 7/10 in severity.  Objective: Filed Vitals:   11/04/14 1457 11/04/14 2057 11/05/14 0532 11/05/14 1447  BP: 142/67 160/74 146/81 129/69  Pulse: 104 106 104 111  Temp: 98.7 F (37.1 C) 98.7 F (37.1 C) 98.7 F (37.1 C) 99 F (37.2 C)  TempSrc: Oral Oral Oral Oral  Resp:  18 19 20   Height:      Weight:   86.5 kg (190 lb 11.2 oz)   SpO2: 93% 93% 92% 92%    Intake/Output Summary (Last 24 hours) at 11/05/14 1841 Last data filed at 11/05/14 1835  Gross per 24 hour  Intake 2093.75 ml  Output    200 ml  Net 1893.75 ml    Exam:   General:  Pt is alert, follows commands appropriately, not in acute distress  Cardiovascular: Regular rhythm, tachycardic no rubs, no gallops  Respiratory: Clear to auscultation bilaterally, no wheezing, diminished breath sounds at bases   Abdomen: Soft, tender in epigastric area, non distended, bowel sounds present, no guarding  Extremities: No edema, pulses DP and PT palpable bilaterally  Neuro: Grossly nonfocal  Data Reviewed: Basic Metabolic Panel:  Recent  Labs Lab 11/02/14 0023 11/02/14 0639 11/03/14 0510 11/04/14 0446 11/05/14 0410  NA 137 136 138 138 139  K 3.8 3.5 3.4* 3.5 2.9*  CL 104 103 110 111 107  CO2 19* 18* 20* 20* 22  GLUCOSE 132* 122* 129* 106* 78  BUN 18 17 29* 20 9  CREATININE 0.63 0.59 0.83 0.72 0.63  CALCIUM 8.8* 8.5* 8.4* 8.2* 8.1*   Liver Function Tests:  Recent Labs Lab 11/02/14 0023 11/02/14 0639  AST 31 24  ALT 30 26  ALKPHOS 79 70  BILITOT 1.4* 1.0  PROT 7.0 6.0*  ALBUMIN 3.7 3.2*    Recent Labs Lab 11/02/14 0023  LIPASE 17*   CBC:  Recent Labs Lab 11/02/14 0023 11/02/14 0337 11/02/14 0639 11/03/14 0510 11/04/14 0446 11/05/14 0410  WBC 12.2* 11.2* 13.4* 13.2* 12.3* 10.6*  NEUTROABS 10.1*  --  10.4*  --   --   --   HGB 13.7 10.7* 11.4* 8.5* 7.8* 7.9*  HCT 41.4 32.5* 35.5* 25.8* 24.1* 24.8*  MCV 89.2 89.5 89.9 90.5 92.3 91.9  PLT 184 155 165 163 158 163   Scheduled Meds: . sodium chloride   Intravenous Once  . atorvastatin  40 mg Oral q1800  . calcitRIOL  0.25 mcg Oral Daily  . cefTRIAXone (ROCEPHIN)  IV  2 g Intravenous Q0600  . darifenacin  7.5 mg Oral Daily  . diltiazem  240 mg Oral Daily  . donepezil  5 mg Oral QHS  . folic acid  1 mg Oral Daily  . metronidazole  500 mg Intravenous 3 times per day  . mycophenolate  250 mg Oral BID  . pantoprazole (PROTONIX) IV  40 mg Intravenous Q12H  . potassium chloride  40 mEq Oral BID  . predniSONE  5 mg Oral Q breakfast  . sertraline  100 mg Oral Daily  . sodium chloride  3 mL Intravenous Q12H  . tacrolimus  1 mg Oral BID  . Warfarin - Pharmacist Dosing Inpatient   Does not apply q1800   Continuous Infusions: . sodium chloride 75 mL/hr at 11/04/14 2100

## 2014-11-05 NOTE — Care Management Note (Signed)
Case Management Note  Patient Details  Name: ARAMI RIDINGER MRN: [id number] Date of Birth: 1960-07-29  Subjective/Objective:                    Action/Plan:   Expected Discharge Date:                Expected Discharge Plan:  Home/Self Care  In-House Referral:     Discharge planning Services  CM Consult  Post Acute Care Choice:    Choice offered to:     DME Arranged:    DME Agency:     HH Arranged:    [place] Agency:     Status of Service:  In process, will continue to follow  Medicare Important Message Given:    Date Medicare IM Given:    Medicare IM give by:    Date Additional Medicare IM Given:    Additional Medicare Important Message give by:     If discussed at [place] of Stay Meetings, dates discussed:    Additional Comments:  Dessa Phi, RN 11/05/2014, 12:18 PM

## 2014-11-05 NOTE — Progress Notes (Signed)
ANTIBIOTIC CONSULT NOTE - FOLLOW UP  Pharmacy Consult for Ceftriaxone Indication: Jejunitis  Allergies  Allergen Reactions  . Oxycodone-Acetaminophen Shortness Of Breath and Nausea Only  . Propoxyphene N-Acetaminophen Shortness Of Breath and Nausea Only  . Sulfonamide Derivatives Shortness Of Breath and Nausea Only  . Codeine Nausea Only  . Latex Rash  . Metoprolol Rash    Patient Measurements: Height: 5\' 4"  (162.6 cm) Weight: 190 lb 11.2 oz (86.5 kg) IBW/kg (Calculated) : 54.7  Vital Signs: Temp: 98.7 F (37.1 C) (05/31 0532) Temp Source: Oral (05/31 0532) BP: 146/81 mmHg (05/31 0532) Pulse Rate: 104 (05/31 0532) Intake/Output from previous day: 05/30 0701 - 05/31 0700 In: 1975 [I.V.:1725; IV Piggyback:250] Out: 200 [Urine:200] Intake/Output from this shift:    Labs:  Recent Labs  11/03/14 0510 11/04/14 0446 11/05/14 0410  WBC 13.2* 12.3* 10.6*  HGB 8.5* 7.8* 7.9*  PLT 163 158 163  CREATININE 0.83 0.72 0.63   Estimated Creatinine Clearance: 85.5 mL/min (by C-G formula based on Cr of 0.63). No results for input(s): VANCOTROUGH, VANCOPEAK, VANCORANDOM, GENTTROUGH, GENTPEAK, GENTRANDOM, TOBRATROUGH, TOBRAPEAK, TOBRARND, AMIKACINPEAK, AMIKACINTROU, AMIKACIN in the last 72 hours.   Microbiology: Recent Results (from the past 720 hour(s))  Clostridium Difficile by PCR     Status: None   Collection Time: 11/05/14  3:18 AM  Result Value Ref Range Status   C difficile by pcr NEGATIVE NEGATIVE Final    Anti-infectives    Start     Dose/Rate Route Frequency Ordered Stop   11/02/14 0630  cefTRIAXone (ROCEPHIN) 2 g in dextrose 5 % 50 mL IVPB - Premix     2 g 100 mL/hr over 30 Minutes Intravenous Daily 11/02/14 0621     11/02/14 0615  metroNIDAZOLE (FLAGYL) IVPB 500 mg     500 mg 100 mL/hr over 60 Minutes Intravenous 3 times per day 11/02/14 0608        Assessment: Patient with abdominal pain in ED with CT showing jejunitis. Ceftriaxone per pharmacy  ordered. -5/30: D4 Abx.  Afebrile, WBC minimally elevated and trending down.  Renal function intact  Goal of Therapy:  Rocephin based on manufacturer dosing recommendations.  Plan:  Continue Ceftriaxone 2gm IV q24hr Pharmacy to follow peripherally  Reuel Boom, PharmD Pager: [phone] 11/05/2014, 11:56 AM

## 2014-11-05 NOTE — Consult Note (Signed)
Referring Provider: [facility] Primary Care Physician:  Renato Shin, MD Primary Gastroenterologist:  Dr. Fuller Plan  Reason for Consultation:  jejunitis    HPI: Connie Ruiz is a 54 y.o. female with a complicated medical history. 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 She previously underwent colonoscopy at [facility] in January 2002 left lower quadrant pain and hematochezia the colonoscopy was normal. She was evaluated by Dr. Fuller Plan in November 2012 to discuss colorectal cancer screening. She underwent an air contrast barium enema which was a limited exam as the patient was unable to retain the balloon. She was noted to have mild diverticulosis.  She presented to the emergency room on May 28 with abdominal pain. She states her abdominal pain came on gradually 3-4 days prior to admission and increased in intensity. She reports the abdominal pain was diffuse and was associated with nausea vomiting and intermittent diarrhea. Although her stools with food she felt like she was constipated. She denied fever or chills. She had an abdominal pelvic CT in the emergency room that showed severe short segment jejunitis. She was started on Cipro and Flagyl as well as follow rest and pain management. Her pain has progressed. Today she states it is a 12 on a scale of 1-10. She has not vomited yesterday or today but states she feels nauseous even with liquids. She states she had a small formed bowel movement today. She denies seeing bright red blood per rectum or melena. She had a repeat CT scan yesterday that showed worsening of the segmental inflammatory changes in the jejunum with worsening of the thickening of the jejunal wall. She has been  noted to have increased perihepatic ascites, small right pleural effusion, and significant gaseous distention of the cecum. There is mild angulation of the anterior portion of the cecum. No thickening of the wall of the cecum to suggest a volvulus.   Past Medical History  Diagnosis Date  . THYROID NODULE, LEFT 04/10/2009  . HYPERLIPIDEMIA 08/21/2007  . GOUT 08/21/2007  . HYPERTENSION 08/21/2007    Dr. Andree Elk, [place] 08/21/2007  . CEREBROVASCULAR ACCIDENT, ACUTE 04/15/2010  . GERD 08/21/2007  . RENAL INSUFFICIENCY 08/21/2007  . LUPUS 08/21/2007  . OSTEOPOROSIS 08/21/2007    Rheumatol at [facility]  . DVT, HX OF 08/21/2007  . CLOSTRIDIUM DIFFICILE COLITIS, HX OF 08/21/2007  . KIDNEY TRANSPLANTATION, HX OF 08/22/2007    s/p renal transplant-Dr. Andree Elk, [facility]  . Pulmonary embolism 07/16/2010  . Renal failure   . Current use of long term anticoagulation     Dr. Andree Elk, [facility]  . Depression     Dr. Andree Elk, [facility]  . History of stroke with residual effects   . Right sided weakness   . CVA 04/17/2010  . Tachycardia   . DIABETES MELLITUS, TYPE II 08/21/2007    Past Surgical History  Procedure Laterality Date  . Cholecystectomy    . Tubal ligation    . Kidney transplant Right 2009  . Renal biopsy, open  1981    Prior to Admission medications   Medication Sig Start Date End Date Taking? Authorizing Provider  atorvastatin (LIPITOR) 40 MG tablet Take 1 tablet (40 mg total) by mouth daily. 10/04/14  Yes Renato Shin, MD  calcitRIOL (ROCALTROL) 0.25 MCG capsule TAKE ONE CAPSULE BY  MOUTH DAILY 04/30/14  Yes Sean Ellison, MD  diltiazem (TIAZAC) 240 MG 24 hr capsule Take 1 capsule (240 mg total) by mouth daily. 10/04/14  Yes Sean Ellison, MD  donepezil (ARICEPT) 5 MG tablet Take 1 tablet (5 mg total) by mouth at bedtime. 05/29/14  Yes Adam R Jaffe, DO  esomeprazole (NEXIUM) 20 MG capsule TAKE ONE CAPSULE BY MOUTH EVERY MORNING BEFORE BREAKFAST 02/13/14  Yes Sean Ellison, MD    folic acid (FOLVITE) 1 MG tablet Take 1 mg by mouth daily.   Yes Historical Provider, MD  losartan (COZAAR) 100 MG tablet Take 1 tablet (100 mg total) by mouth daily. 11/15/13  Yes Sean Ellison, MD  mycophenolate (CELLCEPT) 250 MG capsule Take 250 mg by mouth 2 (two) times daily.   Yes Historical Provider, MD  ondansetron (ZOFRAN ODT) 4 MG disintegrating tablet 4mg ODT q4 hours prn nausea/vomiting Patient taking differently: Take 4 mg by mouth every 8 (eight) hours as needed for nausea.  06/28/14  Yes Scott Goldston, MD  potassium chloride (K-DUR) 10 MEQ tablet Take 1 tablet (10 mEq total) by mouth daily. 04/07/14  Yes Sean Ellison, MD  predniSONE (DELTASONE) 5 MG tablet Take 5 mg by mouth daily with breakfast.   Yes Historical Provider, MD  senna-docusate (SENOKOT-S) 8.6-50 MG per tablet Take 1 tablet by mouth at bedtime as needed for mild constipation. 06/19/13  Yes Vijaya Akula, MD  sertraline (ZOLOFT) 100 MG tablet Take 1 tablet (100 mg total) by mouth daily. 10/04/14  Yes Sean Ellison, MD  solifenacin (VESICARE) 10 MG tablet Take 10 mg by mouth daily.    Yes Historical Provider, MD  tacrolimus (PROGRAF) 1 MG capsule Take 1 mg by mouth 2 (two) times daily.  04/16/14  Yes Historical Provider, MD  warfarin (COUMADIN) 5 MG tablet Take 1 tablet (5 mg total) by mouth as directed. Patient taking differently: Take 7.5 mg by mouth daily at 6 PM.  07/19/14  Yes Jonathan J Berry, MD  Incontinence Supplies MISC "pull-up" type, 6 per day 08/10/13   Sean Ellison, MD  nystatin (MYCOSTATIN) powder Apply 1 g topically 4 (four) times daily as needed. For yeast under breast    Historical Provider, MD  predniSONE (DELTASONE) 1 MG tablet Take 1 tablet (1 mg total) by mouth daily with breakfast. Patient not taking: Reported on 11/02/2014 06/25/13   Daniel Thompson V, MD    Current Facility-Administered Medications  Medication Dose Route Frequency Provider Last Rate Last Dose  . 0.9 %  sodium chloride infusion    Intravenous Continuous Iskra M Myers, MD 75 mL/hr at 11/04/14 2100    . 0.9 %  sodium chloride infusion   Intravenous Once Iskra M Myers, MD      . acetaminophen (TYLENOL) tablet 650 mg  650 mg Oral Q6H PRN Pranav M Patel, MD       Or  . acetaminophen (TYLENOL) suppository 650 mg  650 mg Rectal Q6H PRN Pranav M Patel, MD      . atorvastatin (LIPITOR) tablet 40 mg  40 mg Oral q1800 Pranav M Patel, MD   40 mg at 11/04/14 1735  . calcitRIOL (ROCALTROL) capsule 0.25 mcg  0.25 mcg Oral Daily Pranav M Patel, MD   0.25 mcg at 11/04/14 0945  . cefTRIAXone (ROCEPHIN) 2 g in dextrose 5 % 50 mL IVPB - Premix  2 g Intravenous Q0600 Pranav M Patel, MD   2 g at 11/05/14 0548  . darifenacin (ENABLEX) 24 hr tablet 7.5 mg    7.5 mg Oral Daily Pranav M Patel, MD   7.5 mg at 11/04/14 0945  . diltiazem (TIAZAC) 24 hr capsule 240 mg  240 mg Oral Daily Pranav M Patel, MD   240 mg at 11/04/14 0946  . donepezil (ARICEPT) tablet 5 mg  5 mg Oral QHS Pranav M Patel, MD   5 mg at 11/04/14 2254  . folic acid (FOLVITE) tablet 1 mg  1 mg Oral Daily Pranav M Patel, MD   1 mg at 11/04/14 0945  . HYDROmorphone (DILAUDID) injection 1 mg  1 mg Intravenous Q3H PRN Iskra M Myers, MD   1 mg at 11/04/14 2254  . metroNIDAZOLE (FLAGYL) IVPB 500 mg  500 mg Intravenous 3 times per day Pranav M Patel, MD   500 mg at 11/05/14 0629  . morphine (MSIR) tablet 15 mg  15 mg Oral Q4H PRN Pranav M Patel, MD   15 mg at 11/05/14 0555  . mycophenolate (CELLCEPT) capsule 250 mg  250 mg Oral BID Pranav M Patel, MD   250 mg at 11/04/14 2254  . ondansetron (ZOFRAN) tablet 4 mg  4 mg Oral Q6H PRN Pranav M Patel, MD       Or  . ondansetron (ZOFRAN) injection 4 mg  4 mg Intravenous Q6H PRN Pranav M Patel, MD   4 mg at 11/03/14 1410  . pantoprazole (PROTONIX) injection 40 mg  40 mg Intravenous Q12H Pranav M Patel, MD   40 mg at 11/04/14 2254  . predniSONE (DELTASONE) tablet 5 mg  5 mg Oral Q breakfast Pranav M Patel, MD   5 mg at 11/05/14 0729  .  senna-docusate (Senokot-S) tablet 1 tablet  1 tablet Oral QHS PRN Pranav M Patel, MD      . sertraline (ZOLOFT) tablet 100 mg  100 mg Oral Daily Pranav M Patel, MD   100 mg at 11/04/14 0945  . sodium chloride 0.9 % injection 3 mL  3 mL Intravenous Q12H Pranav M Patel, MD   3 mL at 11/04/14 2200  . tacrolimus (PROGRAF) capsule 1 mg  1 mg Oral BID Pranav M Patel, MD   1 mg at 11/04/14 2254  . Warfarin - Pharmacist Dosing Inpatient   Does not apply q1800 Iskra M Myers, MD   Stopped at 11/03/14 1800    Allergies as of 11/02/2014 - Review Complete 11/02/2014  Allergen Reaction Noted  . Oxycodone-acetaminophen Shortness Of Breath and Nausea Only   . Propoxyphene n-acetaminophen Shortness Of Breath and Nausea Only   . Sulfonamide derivatives Shortness Of Breath and Nausea Only   . Codeine Nausea Only 10/21/2009  . Latex Rash 06/17/2011  . Metoprolol Rash 08/31/2013    Family History  Problem Relation Age of Onset  . Heart attack Mother   . Heart disease Father   . Asthma Sister   . Asthma Sister   . Asthma Daughter   . Cancer Maternal Grandfather     prostate  . Cancer Paternal Grandfather     colon    History   Social History  . Marital Status: Divorced    Spouse Name: N/A  . Number of Children: 1  . Years of Education: N/A   Occupational History  . DISABILITY    Social History Main Topics  . Smoking status: Never Smoker   . Smokeless tobacco: Never Used  . Alcohol Use: No  . Drug Use: No  . Sexual Activity: No   Other Topics Concern  . Not on file     Social History Narrative   Retired   Regular exercise-no    Review of Systems: Gen: Denies any fever, chills, sweats, anorexia, fatigue, weakness, malaise, weight loss, and sleep disorder CV: Denies chest pain, angina, palpitations, syncope, orthopnea, PND, peripheral edema, and claudication. Resp: Denies dyspnea at rest, dyspnea with exercise, cough, sputum, wheezing, coughing up blood, and pleurisy. GI: Denies  vomiting blood, jaundice, and fecal incontinence.   Denies dysphagia or odynophagia. GU : Denies urinary burning, blood in urine, urinary frequency, urinary hesitancy, nocturnal urination, and urinary incontinence. MS: Has intermittent joint pain Derm: Denies rash, itching, dry skin, hives, moles, warts, or unhealing ulcers.  Psych: Has depression and has had memory loss in the past Heme: Denies bruising, bleeding Neuro:  Denies any headaches, dizziness, paresthesias.   Physical Exam: Vital signs in last 24 hours: Temp:  [98.7 F (37.1 C)] 98.7 F (37.1 C) (05/31 0532) Pulse Rate:  [104-106] 104 (05/31 0532) Resp:  [18-19] 19 (05/31 0532) BP: (130-160)/(58-81) 146/81 mmHg (05/31 0532) SpO2:  [92 %-93 %] 92 % (05/31 0532) Weight:  [190 lb 11.2 oz (86.5 kg)] 190 lb 11.2 oz (86.5 kg) (05/31 0532) Last BM Date: 11/05/14 General:   Alert,  Well-developed, well-nourished, African-American female who appears uncomfortable Head:  Normocephalic and atraumatic. Eyes:  Sclera clear, no icterus.   Conjunctiva pink. Ears:  Normal auditory acuity. Nose:  No deformity, discharge,  or lesions. Mouth:  No deformity or lesions.   Neck:  Supple; no masses or thyromegaly. Lungs:  Clear throughout to auscultation.    . Heart:  Regular rate and rhythm; systolic murmur Abdomen:  Soft, nondistended, diffuse moderate tenderness with no rebound or guarding, BS active,nonpalp mass or hsm.   Rectal:  Liquid brown stool heme-positive Msk:  Symmetrical without gross deformities. . Pulses:  Normal pulses noted. Extremities: Without clubbing or edema. Neurologic: Alert and  oriented x4;  grossly normal neurologically. Skin: Intact without significant lesions or rashes.. Psych:  Alert and cooperative. Normal mood and affect.  Intake/Output from previous day: 05/30 0701 - 05/31 0700 In: 1975 [I.V.:1725; IV Piggyback:250] Out: 200 [Urine:200] Intake/Output this shift:    Lab Results:  Recent Labs   11/03/14 0510 11/04/14 0446 11/05/14 0410  WBC 13.2* 12.3* 10.6*  HGB 8.5* 7.8* 7.9*  HCT 25.8* 24.1* 24.8*  PLT 163 158 163   BMET  Recent Labs  11/03/14 0510 11/04/14 0446 11/05/14 0410  NA 138 138 139  K 3.4* 3.5 2.9*  CL 110 111 107  CO2 20* 20* 22  GLUCOSE 129* 106* 78  BUN 29* 20 9  CREATININE 0.83 0.72 0.63  CALCIUM 8.4* 8.2* 8.1*    PT/INR  Recent Labs  11/04/14 0446 11/05/14 0410  LABPROT 21.2* 26.6*  INR 1.84* 2.48*     Studies/Results: Ct Abdomen Pelvis W Contrast  11/04/2014   CLINICAL DATA:  Umbilical pain, abdominal pain, diarrhea  EXAM: CT ABDOMEN AND PELVIS WITH CONTRAST  TECHNIQUE: Multidetector CT imaging of the abdomen and pelvis was performed using the standard protocol following bolus administration of intravenous contrast.  CONTRAST:  100mL OMNIPAQUE IOHEXOL 300 MG/ML  SOLN  COMPARISON:  5/28/ 16  FINDINGS: There is small right pleural effusion with right lower lobe posterior atelectasis. Again noted perihepatic ascites increased in size from prior exam. Postcholecystectomy surgical clips are noted. There is contrast material in distal esophagus probable from gastroesophageal reflux.  There is no evidence of gastric outlet obstruction.  No evidence of small bowel obstruction. Again noted significant segmental   thickening of jejunal wall in left lower quadrant best seen in axial image 54. Mild worsening from prior exam. Wall thickness measures at least 1.1 cm. Again findings consistent with segmental enteritis probable infectious. Atherosclerotic calcifications of abdominal aorta and bilateral iliac artery again noted. Atherosclerotic calcifications bilateral renal artery origin. Atherosclerotic calcifications of celiac trunk and SMA origin.  There is again noted mild stranding of mesenteric fat adjacent to inflamed segment of small bowel in left abdomen. Small mesenteric lymph nodes are noted at this level probable reactive.  The native kidneys are small in  size atrophic. Stable lesion seen native kidneys. Again noted normal function an appearance of transplanted kidney in right lower quadrant.  There is significant gaseous distension of the cecum in left mid abdomen anteriorly. The cecum measures at least 6 cm in diameter suspicious for segmental sickle ileus. There is no evidence of small bowel obstruction. Contrast material is noted in right colon and transverse colon. 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 Small amount of ascites is noted in right paracolic gutter. Normal appendix is partially visualized in axial image 59. There is small amount of pelvic ascites in right lower pelvis. The uterus and adnexa are unremarkable. No distal colonic obstruction. Few colonic diverticula are noted in left colon. There is contrast material in left colon and sigmoid colon.  Delayed renal images shows normal excretion of transplanted kidney.  Sagittal images of the spine are unremarkable. The pancreas, spleen and adrenal glands are unremarkable. No focal hepatic mass. Status postcholecystectomy.  IMPRESSION: 1. There is slight worsening in segmental inflammatory changes in jejunum in left abdomen. Slight worsening of thickening of the jejunal wall up to 1.1 cm. Again findings consistent with segmental enteritis. This may be infectious in nature or due to lupus enteritis 2. Mild increased perihepatic ascites. 3. There is small right pleural effusion with right lower lobe posterior atelectasis. 4. There is significant gaseous distension of the cecum. Measures up to 6 cm. There is mild angulation and anterior position of the cecum . Findings suspicious for mobile cecum. There is no thickening of cecal wall to suggest cecal volvulus. There is no evidence of obstruction. Cecal ileus cannot be excluded. 5. Normal appendix. No colonic obstruction. Contrast material is noted throughout the colon.  6. Small amount of pelvic ascites. 7. Normal function and appearance of transplanted kidney. Again noted atrophic native kidneys.   Electronically Signed   By: Liviu  Pop M.D.   On: 11/04/2014 17:02    IMPRESSION/PLAN: #1. Abdominal pain. Patient admitted with abdominal pain and had CT scan that showed segmental inflammatory changes in the jejunum in the left abdomen at his, worse on repeat CT. CT is consistent with segmental enteritis which may be infectious or vasculitic due to her lupus. No evidence of intra-abdominal hematoma mentioned on CT scan. Unlikely perforated jejunal diverticulum as no evidence of perforation mentioned on CT scan. Would continue anti-biotics. Continue IV fluids. Would make nothing by mouth at this point. Will consider enteroscopy for evaluation of small bowel tomorrow if INR closer to normal range.. Will need to stop warfarin today and let INR drift to 1.6 or lower. Will check ESR and ANA today   #2. Hypertension. Currently on Cardizem.  #3. History of DVT. Has been on chronic Coumadin which has been on hold since the 29th. Received 2 doses of vitamin K 5 mg IV. INR on admission was above 10. On   May 30 was 1.84, today is 2.48. May need additional vitamin K.  #4. Anemia of chronic disease, hemoglobin this morning 7.9, was 7.8 yesterday. Patient found to have heme positive stools today. Received 1 unit of packed cells on the 30th. Trend CBC. Would keep hemoglobin above 8.  #5. Hypokalemia. Replacement ordered. Will check electrolytes tomorrow morning.  Will review with Dr. Julianna Vanwagner for further recommendations.   Hvozdovic, Lori P PA-C 11/05/2014,  Pager  Attending MD note:   I have taken a history, examined the patient, and reviewed the chart as well as the CT scan of the abdomen. I agree with the Advanced Practitioner's impression and recommendations. It appears to be an acute segmental  Jejunitis involving about 1 foot of mid jejunum, ( SMA distribution), The major   mesenteric arteries are patent, suspect  Vasculitis could be a possibility. Also infection. Crohn's disease less likely since TI appears normal and she has no Sx's of chronic diarrhea etc. Perforated diverticulum not a consideration. Also embolic event not likely with INR 2.4. But ischemic jejunitis due to a low flow state is a possibility. I suggest to cont bowl rest, antibiotic coverage, re-imaging in 48-72 hours unless her condition deteriorates. Enteroscopy not likely to reach that far into the jejunum.  Cailyn Houdek,MD  Gastroenterology Pager # [phone]  

## 2014-11-05 NOTE — Care Management Note (Signed)
Case Management Note  Patient Details  Name: Connie Ruiz MRN: [id number] Date of Birth: Nov 17, 1960  Subjective/Objective:   GI  Cons.iv abx x2.ivf.                 Action/Plan: d/c plan home.   Expected Discharge Date:               Expected Discharge Plan:  Home/Self Care  In-House Referral:     Discharge planning Services  CM Consult  Post Acute Care Choice:    Choice offered to:     DME Arranged:    DME Agency:     HH Arranged:    HH Agency:     Status of Service:  In process, will continue to follow  Medicare Important Message Given:  Yes Date Medicare IM Given:  11/05/14 Medicare IM give by:  Dessa Phi Date Additional Medicare IM Given:    Additional Medicare Important Message give by:     If discussed at [place] of Stay Meetings, dates discussed:    Additional Comments:  Dessa Phi, RN 11/05/2014, 12:25 PM

## 2014-11-06 ENCOUNTER — Encounter (HOSPITAL_COMMUNITY)
Admission: EM | Disposition: A | Discharge status: Skilled Nursing Facility | Payer: Self-pay | Source: Home / Self Care | Attending: Internal Medicine

## 2014-11-06 DIAGNOSIS — I82409 Acute embolism and thrombosis of unspecified deep veins of unspecified lower extremity: Secondary | ICD-10-CM

## 2014-11-06 DIAGNOSIS — Z940 Kidney transplant status: Secondary | ICD-10-CM

## 2014-11-06 DIAGNOSIS — R791 Abnormal coagulation profile: Secondary | ICD-10-CM

## 2014-11-06 DIAGNOSIS — M329 Systemic lupus erythematosus, unspecified: Secondary | ICD-10-CM

## 2014-11-06 DIAGNOSIS — I6930 Unspecified sequelae of cerebral infarction: Secondary | ICD-10-CM

## 2014-11-06 DIAGNOSIS — R109 Unspecified abdominal pain: Secondary | ICD-10-CM | POA: Diagnosis present

## 2014-11-06 LAB — COMPREHENSIVE METABOLIC PANEL
ALT: 14 U/L (ref 14–54)
AST: 16 U/L (ref 15–41)
Albumin: 2.8 g/dL — ABNORMAL LOW (ref 3.5–5.0)
Alkaline Phosphatase: 57 U/L (ref 38–126)
Anion gap: 15 (ref 5–15)
BUN: 5 mg/dL — ABNORMAL LOW (ref 6–20)
CO2: 23 mmol/L (ref 22–32)
Calcium: 8.4 mg/dL — ABNORMAL LOW (ref 8.9–10.3)
Chloride: 104 mmol/L (ref 101–111)
Creatinine, Ser: 0.65 mg/dL (ref 0.44–1.00)
GFR calc Af Amer: >60 mL/min (ref >60)
GFR calc non Af Amer: >60 mL/min (ref >60)
Glucose, Bld: 69 mg/dL (ref 65–99)
Potassium: 3 mmol/L — ABNORMAL LOW (ref 3.5–5.1)
Sodium: 142 mmol/L (ref 135–145)
Total Bilirubin: 1.3 mg/dL — ABNORMAL HIGH (ref 0.3–1.2)
Total Protein: 5.6 g/dL — ABNORMAL LOW (ref 6.5–8.1)

## 2014-11-06 LAB — IRON AND TIBC
Iron: 33 ug/dL (ref 28–170)
Saturation Ratios: 15 % (ref 10.4–31.8)
TIBC: 217 ug/dL — ABNORMAL LOW (ref 250–450)
UIBC: 184 ug/dL

## 2014-11-06 LAB — PROTIME-INR
INR: 3.63 — ABNORMAL HIGH (ref 0.00–1.49)
Prothrombin Time: 35.4 seconds — ABNORMAL HIGH (ref 11.6–15.2)

## 2014-11-06 LAB — CBC
HCT: 25.8 % — ABNORMAL LOW (ref 36.0–46.0)
Hemoglobin: 8.3 g/dL — ABNORMAL LOW (ref 12.0–15.0)
MCH: 29.7 pg (ref 26.0–34.0)
MCHC: 32.2 g/dL (ref 30.0–36.0)
MCV: 92.5 fL (ref 78.0–100.0)
Platelets: 198 10*3/uL (ref 150–400)
RBC: 2.79 MIL/uL — ABNORMAL LOW (ref 3.87–5.11)
RDW: 14.4 % (ref 11.5–15.5)
WBC: 10.4 10*3/uL (ref 4.0–10.5)

## 2014-11-06 LAB — RETICULOCYTES
RBC.: 3.15 MIL/uL — ABNORMAL LOW (ref 3.87–5.11)
Retic Count, Absolute: 138.6 10*3/uL (ref 19.0–186.0)
Retic Ct Pct: 4.4 % — ABNORMAL HIGH (ref 0.4–3.1)

## 2014-11-06 LAB — FANA STAINING PATTERNS: Homogeneous Pattern: 1:1280 {titer}

## 2014-11-06 LAB — MAGNESIUM: Magnesium: 1.3 mg/dL — ABNORMAL LOW (ref 1.7–2.4)

## 2014-11-06 LAB — ANTINUCLEAR ANTIBODIES, IFA

## 2014-11-06 LAB — FERRITIN: Ferritin: 118 ng/mL (ref 11–307)

## 2014-11-06 LAB — VITAMIN B12: Vitamin B-12: 735 pg/mL (ref 180–914)

## 2014-11-06 LAB — FOLATE: Folate: 10.4 ng/mL (ref >5.9)

## 2014-11-06 SURGERY — ENTEROSCOPY
Anesthesia: Monitor Anesthesia Care

## 2014-11-06 MED ORDER — PHYTONADIONE 5 MG PO TABS
5.0000 mg | ORAL_TABLET | Freq: Once | ORAL | Status: AC
  Administered 2014-11-06: 5 mg via ORAL
  Filled 2014-11-06: qty 1

## 2014-11-06 MED ORDER — MAGNESIUM SULFATE 4 GM/100ML IV SOLN
4.0000 g | Freq: Once | INTRAVENOUS | Status: AC
  Administered 2014-11-06: 4 g via INTRAVENOUS
  Filled 2014-11-06: qty 100

## 2014-11-06 MED ORDER — LOSARTAN POTASSIUM 50 MG PO TABS
100.0000 mg | ORAL_TABLET | Freq: Every day | ORAL | Status: DC
  Administered 2014-11-06 – 2014-11-14 (×8): 100 mg via ORAL
  Filled 2014-11-06 (×9): qty 2

## 2014-11-06 MED ORDER — METHYLPREDNISOLONE SODIUM SUCC 125 MG IJ SOLR
60.0000 mg | Freq: Two times a day (BID) | INTRAMUSCULAR | Status: DC
  Administered 2014-11-06 – 2014-11-13 (×13): 60 mg via INTRAVENOUS
  Filled 2014-11-06 (×16): qty 0.96

## 2014-11-06 NOTE — Progress Notes (Signed)
ANTICOAGULATION CONSULT NOTE - Follow Up Consult  Pharmacy Consult for warfarin Indication: DVT and stroke  Allergies  Allergen Reactions  . Oxycodone-Acetaminophen Shortness Of Breath and Nausea Only  . Propoxyphene N-Acetaminophen Shortness Of Breath and Nausea Only  . Sulfonamide Derivatives Shortness Of Breath and Nausea Only  . Codeine Nausea Only  . Latex Rash  . Metoprolol Rash   Patient Measurements: Height: 5\' 4"  (162.6 cm) Weight: 188 lb 0.8 oz (85.3 kg) IBW/kg (Calculated) : 54.7  Vital Signs: Temp: 98.7 F (37.1 C) (06/01 0512) Temp Source: Oral (06/01 0512) BP: 161/82 mmHg (06/01 0512) Pulse Rate: 108 (06/01 0512)  Labs:  Recent Labs  11/04/14 0446 11/05/14 0410 11/06/14 0420  HGB 7.8* 7.9* 8.3*  HCT 24.1* 24.8* 25.8*  PLT 158 163 198  LABPROT 21.2* 26.6* 35.4*  INR 1.84* 2.48* 3.63*  CREATININE 0.72 0.63 0.65   Estimated Creatinine Clearance: 84.9 mL/min (by C-G formula based on Cr of 0.65).  Medications:  Scheduled:  . atorvastatin  40 mg Oral q1800  . calcitRIOL  0.25 mcg Oral Daily  . cefTRIAXone (ROCEPHIN)  IV  2 g Intravenous Q0600  . darifenacin  7.5 mg Oral Daily  . diltiazem  240 mg Oral Daily  . donepezil  5 mg Oral QHS  . folic acid  1 mg Oral Daily  . losartan  100 mg Oral Daily  . magnesium sulfate 1 - 4 g bolus IVPB  4 g Intravenous Once  . metronidazole  500 mg Intravenous 3 times per day  . mycophenolate  250 mg Oral BID  . pantoprazole (PROTONIX) IV  40 mg Intravenous Q12H  . potassium chloride  40 mEq Oral BID  . predniSONE  5 mg Oral Q breakfast  . sertraline  100 mg Oral Daily  . sodium chloride  3 mL Intravenous Q12H  . tacrolimus  1 mg Oral BID  . Warfarin - Pharmacist Dosing Inpatient   Does not apply q1800   Assessment:  24 yoF admitted with c/o diffuse abd pain, N/V. 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 Jejunitis on CT, treating with Cipro/Flagyl. Other hx: Lupus, Renal  transplant on immunomodulators. INR > 10 on admission, treated with IV/PO Vit K + FFP.  Warfarin resumed 5/29 with INR 1.58.  Today 11/06/2014  INR now supratherapeutic after warfarin x 2 doses  Hgb sl improved from yesterday, Plt wnl  Flagyl can increase INR, poor po intake  No bleeding documented  Per GI note 5/31: "Will consider enteroscopy for evaluation of small bowel tomorrow if INR closer to normal range. Will need to stop warfarin today and let INR drift to 1.6 or lower"  Goal of Therapy:  INR 2-3   Plan:   No warfarin today.  Will likely need Vitamin K if enteroscopy needs to be performed in next 24-48 hrs.  Daily INR  CBC at least q 72 hr while on warfarin  Monitor for signs of bleeding or thrombosis  Reuel Boom, PharmD Pager: [phone] 11/06/2014, 10:55 AM

## 2014-11-06 NOTE — Progress Notes (Signed)
[facility] Progress Note  Subjective:   No nausea or vomiting. Passing small BMs with loose and formed componets. No BRBPR. No melena. Still with abd pain--says no better.  Pt is followed by Dr Annabelle Harman at [facility] for rheumatology.  06/26/14 labs: negative SSA, SSB, smith ab, negative hepatitis profile, PTH elevated at 97 and tsh wnl 05/22/14: vit d 21, quantiferon negative, uric acid 2.8, dsdna negative, C3/c4 wnl.  Abnormal lupus inhibitor. Neg dsdna. +ANA 1:1280    Objective:  Vital signs in last 24 hours: Temp:  [98.7 F (37.1 C)-99.2 F (37.3 C)] 98.7 F (37.1 C) (06/01 0512) Pulse Rate:  [106-111] 108 (06/01 0512) Resp:  [17-20] 18 (06/01 0512) BP: (129-161)/(69-82) 161/82 mmHg (06/01 0512) SpO2:  [92 %-94 %] 93 % (06/01 0512) Weight:  [188 lb 0.8 oz (85.3 kg)] 188 lb 0.8 oz (85.3 kg) (06/01 0512) Last BM Date: 11/05/14 General:   Alert,  Well-developed, in NAD Heart:  Regular rate and rhythm; no murmurs Pulm;lungs clear Abdomen:  Soft, mild diffuse tenderness nondistended. Normal bowel sounds, without guarding, and without rebound.   Extremities:  Without edema. Neurologic:Alert and  oriented x4;  grossly normal neurologically. Psych:  Alert and cooperative. Normal mood and affect.  Intake/Output from previous day: 05/31 0701 - 06/01 0700 In: 2345 [P.O.:120; I.V.:1875; IV Piggyback:350] Out: 150 [Urine:150] Intake/Output this shift:    Lab Results:  Recent Labs  11/04/14 0446 11/05/14 0410 11/06/14 0420  WBC 12.3* 10.6* 10.4  HGB 7.8* 7.9* 8.3*  HCT 24.1* 24.8* 25.8*  PLT 158 163 198   BMET  Recent Labs  11/04/14 0446 11/05/14 0410 11/06/14 0420  NA 138 139 142  K 3.5 2.9* 3.0*  CL 111 107 104  CO2 20* 22 23  GLUCOSE 106* 78 69  BUN 20 9 5*  CREATININE 0.72 0.63 0.65  CALCIUM 8.2* 8.1* 8.4*   LFT  Recent Labs  11/06/14 0420  PROT 5.6*  ALBUMIN 2.8*  AST 16  ALT 14  ALKPHOS 57  BILITOT 1.3*   PT/INR  Recent Labs  11/05/14 0410 11/06/14 0420  LABPROT 26.6* 35.4*  INR 2.48* 3.63*    Ct Abdomen Pelvis W Contrast  11/04/2014   CLINICAL DATA:  Umbilical pain, abdominal pain, diarrhea  EXAM: CT ABDOMEN AND PELVIS WITH CONTRAST  TECHNIQUE: Multidetector CT imaging of the abdomen and pelvis was performed using the standard protocol following bolus administration of intravenous contrast.  CONTRAST:  118mL OMNIPAQUE IOHEXOL 300 MG/ML  SOLN  COMPARISON:  5/28/ 16  FINDINGS: There is small right pleural effusion with right lower lobe posterior atelectasis. Again noted perihepatic ascites increased in size from prior exam. Postcholecystectomy surgical clips are noted. There is contrast material in distal esophagus probable from gastroesophageal reflux.  There is no evidence of gastric outlet obstruction.  No evidence of small bowel obstruction. Again noted significant segmental thickening of jejunal wall in left lower quadrant best seen in axial image 54. Mild worsening from prior exam. Wall thickness measures at least 1.1 cm. Again findings consistent with segmental enteritis probable infectious. Atherosclerotic calcifications of abdominal aorta and bilateral iliac artery again noted. Atherosclerotic calcifications bilateral renal artery origin. Atherosclerotic calcifications of celiac trunk and SMA origin.  There is again noted mild stranding of mesenteric fat adjacent to inflamed segment of small bowel in left abdomen. Small mesenteric lymph nodes are noted at this level probable reactive.  The native kidneys are small in size atrophic. Stable lesion seen native kidneys.  Again noted normal function an appearance of transplanted kidney in right lower quadrant.  There is significant gaseous distension of the cecum in left mid abdomen anteriorly. The cecum measures at least 6 cm in diameter suspicious for segmental sickle ileus. There is no evidence of small bowel obstruction. Contrast material is noted in right colon and  transverse colon. 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 Small amount of ascites is noted in right paracolic gutter. Normal appendix is partially visualized in axial image 59. There is small amount of pelvic ascites in right lower pelvis. The uterus and adnexa are unremarkable. No distal colonic obstruction. Few colonic diverticula are noted in left colon. There is contrast material in left colon and sigmoid colon.  Delayed renal images shows normal excretion of transplanted kidney.  Sagittal images of the spine are unremarkable. The pancreas, spleen and adrenal glands are unremarkable. No focal hepatic mass. Status postcholecystectomy.  IMPRESSION: 1. There is slight worsening in segmental inflammatory changes in jejunum in left abdomen. Slight worsening of thickening of the jejunal wall up to 1.1 cm. Again findings consistent with segmental enteritis. This may be infectious in nature or due to lupus enteritis 2. Mild increased perihepatic ascites. 3. There is small right pleural effusion with right lower lobe posterior atelectasis. 4. There is significant gaseous distension of the cecum. Measures up to 6 cm. There is mild angulation and anterior position of the cecum . Findings suspicious for mobile cecum. There is no thickening of cecal wall to suggest cecal volvulus. There is no evidence of obstruction. Cecal ileus cannot be excluded. 5. Normal appendix. No colonic obstruction. Contrast material is noted throughout the colon. 6. Small amount of pelvic ascites. 7. Normal function and appearance of transplanted kidney. Again noted atrophic native kidneys.   Electronically Signed   By: Lahoma Crocker M.D.   On: 11/04/2014 17:02    ASSESSMENT/PLAN:   54 yo female admitted with abd pain found to have jejunitis involving about 1 foot of jejunum. WBC 10.4. Hgb stable. Suspect infection vs vasculitis. Continue IVF and  antibiotics.Will add solumedrol. Clear liquids today. If tolerates, may advance tomorrow. If develops nausea or worsening pain would decrease po and re-image.     LOS: 4 days   Hvozdovic, Deloris Ping 11/06/2014, Pager [phone] Attending MD note:   I have taken a history, examined the patient, and reviewed the chart. I agree with the Advanced Practitioner's impression and recommendations. In terms of pain she is about the same. 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 I agree with starting IV steroids Solumedrol 60 mg IV q 12 hours.. She reports being on steroids earlier this year.  Rec  Follow up CT scan. Monitor sed.rate  [facility] Pager # [phone]

## 2014-11-06 NOTE — Progress Notes (Signed)
Progress Note   Connie Ruiz LEX:[id number] DOB: 1960/09/14 DOA: 11/02/2014 PCP: Renato Shin, MD   Brief Narrative:   Connie Ruiz is an 54 y.o. female with complicated medical history including HTN, DM, lupus and antiphospholipid syndrome, s/p renal transplant requiring long term immunosuppressant, CVA in 2011, PE in 07/2010 and requiring long term AC with Coumadin, last known colonoscopy in [place] in 2002 which was reportedly unremarkable, admitted 11/02/14 with jejunitis being managed with Flagyl/Rocephin.  Hospital course has been complicated by persistent abd pain despite IV ABX regimen. Repeat CT abd and pelvis on 11/04/14 showed potentially worsening diverticulitis and GI team subsequently consulted for further assistance.   Assessment/Plan:   Principal Problem:  Acute abd pain secondary to jejunitis - CT abd and pelvis repeated on 11/04/14 and suggestive of somewhat worsening enteritis. - Differential of etiology is infectious versus vasculitic secondary to lupus. ESR 65, ANA pending. - GI following, may need enteroscopy for evaluation of small bowel. - Coumadin will be on hold in an anticipation of potential intervention, goal INR < 1.6 for intervention purpose. - Continue empiric Flagyl/Rocephin. Solu-Medrol 60 mg IV every 12 hours started by GI.  Active Problems:  Essential hypertension -  Continue Cardizem. Resume Cozaar given systolic blood pressure elevation into the 160s.   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  - Pt is followed by Connie Ruiz at [facility] for rheumatology.  - Prior workup showed negative dsDNA, SSA and SSB, negative Smith antibody, negative hepatitis profile. - Patient was ANA + 1:1280 with an abnormal lupus inhibitor. - Solu-Medrol started due to concerns for vasculitis induced segmental jejunitis.   DVT (deep venous thrombosis) -  Coumadin currently on hold in anticipation of enteroscopy. Resume post procedure.   Anemia of chronic disease, lupus and IDA - FOBT requested. No evidence of active significant GI bleeding. - The patient is [name] and declines blood products for support. - Check anemia panel.   Depression - Continue Zoloft.    Hypokalemia / hypomagnesemia - Monitor and replace potassium as needed. - We'll give 4 g of magnesium today and continue potassium supplementation.   Supratherapeutic INR - Was given 2 doses of vit K 5 mg IV on admission. - Coumadin currently on hold.   Obesity  - Body mass index is 31.2 kg/(m^2).    DVT prophylaxis  - On chronic anticoagulation with Coumadin.   Code Status: Full.  Family Communication: No family at the bedside. Called her sister and her daughter but neither one was available. Updated her brother by telephone.  Disposition Plan: Home when pain better controlled and responding to treatment, likely another several days.    IV Access:    Peripheral IV   Procedures and diagnostic studies:   Ct Abdomen Pelvis W Contrast  11/04/2014   CLINICAL DATA:  Umbilical pain, abdominal pain, diarrhea  EXAM: CT ABDOMEN AND PELVIS WITH CONTRAST  TECHNIQUE: Multidetector CT imaging of the abdomen and pelvis was performed using the standard protocol following bolus administration of intravenous contrast.  CONTRAST:  126m OMNIPAQUE IOHEXOL 300 MG/ML  SOLN  COMPARISON:  5/28/ 16  FINDINGS: There is small right pleural effusion with right lower lobe posterior atelectasis. Again noted perihepatic ascites increased in size from prior exam. Postcholecystectomy surgical clips are noted. There is contrast material in distal esophagus probable from gastroesophageal reflux.  There is no evidence of gastric outlet obstruction.  No evidence of small bowel obstruction. Again noted significant segmental thickening of jejunal wall in left lower quadrant best seen in  axial image 54. Mild worsening from prior exam. Wall thickness measures at least 1.1 cm. Again findings consistent with segmental enteritis probable infectious. Atherosclerotic calcifications of abdominal aorta and bilateral iliac artery again noted. Atherosclerotic calcifications bilateral renal artery origin. Atherosclerotic calcifications of celiac trunk and SMA origin.  There is again noted mild stranding of mesenteric fat adjacent to inflamed segment of small bowel in left abdomen. Small mesenteric lymph nodes are noted at this level probable reactive.  The native kidneys are small in size atrophic. Stable lesion seen native kidneys. Again noted normal function an appearance of transplanted kidney in right lower quadrant.  There is significant gaseous distension of the cecum in left mid abdomen anteriorly. The cecum measures at least 6 cm in diameter suspicious for segmental sickle ileus. There is no evidence of small bowel obstruction. Contrast material is noted in right colon and transverse colon. 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 Small amount of ascites is noted in right paracolic gutter. Normal appendix is partially visualized in axial image 59. There is small amount of pelvic ascites in right lower pelvis. The uterus and adnexa are unremarkable. No distal colonic obstruction. Few colonic diverticula are noted in left colon. There is contrast material in left colon and sigmoid colon.  Delayed renal images shows normal excretion of transplanted kidney.  Sagittal images of the spine are unremarkable. The pancreas, spleen and adrenal glands are unremarkable. No focal hepatic mass. Status postcholecystectomy.  IMPRESSION: 1. There is slight worsening in segmental inflammatory changes in jejunum in left abdomen. Slight worsening of thickening of the jejunal wall up to 1.1 cm. Again findings consistent with segmental  enteritis. This may be infectious in nature or due to lupus enteritis 2. Mild increased perihepatic ascites. 3. There is small right pleural effusion with right lower lobe posterior atelectasis. 4. There is significant gaseous distension of the cecum. Measures up to 6 cm. There is mild angulation and anterior position of the cecum . Findings suspicious for mobile cecum. There is no thickening of cecal wall to suggest cecal volvulus. There is no evidence of obstruction. Cecal ileus cannot be excluded. 5. Normal appendix. No colonic obstruction. Contrast material is noted throughout the colon. 6. Small amount of pelvic ascites. 7. Normal function and appearance of transplanted kidney. Again noted atrophic native kidneys.   Electronically Signed   By: Lahoma Crocker M.D.   On: 11/04/2014 17:02   Ct Abdomen Pelvis W Contrast  11/02/2014   CLINICAL DATA:  Acute abdominal pain  EXAM: CT ABDOMEN AND PELVIS WITH CONTRAST  TECHNIQUE: Multidetector CT imaging of the abdomen and pelvis was performed using the standard protocol following bolus administration of intravenous contrast.  CONTRAST:  166m OMNIPAQUE IOHEXOL 300 MG/ML  SOLN  COMPARISON:  06/28/2014  FINDINGS: BODY WALL: No contributory findings.  LOWER CHEST: Extensive coronary atherosclerosis for age  ABDOMEN/PELVIS:  Liver: No focal abnormality.  Biliary: Cholecystectomy with chronic intra and extrahepatic biliary duct enlargement  Pancreas: Unremarkable.  Spleen: Single coarse calcification, likely granulomatous.  Adrenals: Unremarkable.  Kidneys and ureters: There is a transplant kidney in the right lower quadrant, already excreting contrast. Would always consult with transplant surgeon prior to administering IV contrast. Severe native renal atrophy with stable low-density renal lesions which are indeterminate due to soft tissue density,  but size stable since 2013, 22 mm on the left and 16 mm on the right.  Bladder: Unremarkable.  Reproductive: No pathologic  findings.  Bowel: There is severe wall thickening of a jejunum segment with low-density submucosal appearance consistent with edema. The mesentery is diffusely edematous. The associated vessels appear widely patent. There is no evidence of perforation or abscess. No bowel obstruction. Distal colonic diverticulosis. Normal appendix.  Retroperitoneum: No mass or adenopathy.  Peritoneum: Small ascites around the liver and in the pelvis, considered reactive.  Vascular: No acute abnormality.  OSSEOUS: No acute abnormalities.  IMPRESSION: 1. Severe short-segment jejunitis, likely infectious or lupus enteritis (given patient's history). 2. Bilateral native renal lesions are indeterminate due to density, but size stable from 2013. 3. Additional chronic findings are stable from prior noted above.   Electronically Signed   By: Monte Fantasia M.D.   On: 11/02/2014 04:41     Medical Consultants:    Gastroenterology  Anti-Infectives:    Rocephin 5/28 -->  Flagyl 5/28 -->  Subjective:   Connie Ruiz  continues to report abdominal pain and nausea. No dyspnea. No chest pain. Passing flatus. Having stools, denies frank blood in the stools.  Objective:    Filed Vitals:   11/05/14 0532 11/05/14 1447 11/05/14 2105 11/06/14 0512  BP: 146/81 129/69 161/75 161/82  Pulse: 104 111 106 108  Temp: 98.7 F (37.1 C) 99 F (37.2 C) 99.2 F (37.3 C) 98.7 F (37.1 C)  TempSrc: Oral Oral Oral Oral  Resp: '19 20 17 18  ' Height:      Weight: 86.5 kg (190 lb 11.2 oz)   85.3 kg (188 lb 0.8 oz)  SpO2: 92% 92% 94% 93%    Intake/Output Summary (Last 24 hours) at 11/06/14 2263 Last data filed at 11/06/14 0700  Gross per 24 hour  Intake   2345 ml  Output    150 ml  Net   2195 ml    Exam: Gen:  NAD Cardiovascular:  Tachycardic No M/R/G Respiratory:  Lungs CTAB, diminished in the bases. Gastrointestinal:  Abdomen soft,  tender in the lower quadrants + BS Extremities:  No C/E/C   Data Reviewed:     Labs: Basic Metabolic Panel:  Recent Labs Lab 11/02/14 0639 11/03/14 0510 11/04/14 0446 11/05/14 0410 11/06/14 0420  NA 136 138 138 139 142  K 3.5 3.4* 3.5 2.9* 3.0*  CL 103 110 111 107 104  CO2 18* 20* 20* 22 23  GLUCOSE 122* 129* 106* 78 69  BUN 17 29* 20 9 5*  CREATININE 0.59 0.83 0.72 0.63 0.65  CALCIUM 8.5* 8.4* 8.2* 8.1* 8.4*  MG  --   --   --   --  1.3*   GFR Estimated Creatinine Clearance: 84.9 mL/min (by C-G formula based on Cr of 0.65). Liver Function Tests:  Recent Labs Lab 11/02/14 0023 11/02/14 0639 11/06/14 0420  AST '31 24 16  ' ALT '30 26 14  ' ALKPHOS 79 70 57  BILITOT 1.4* 1.0 1.3*  PROT 7.0 6.0* 5.6*  ALBUMIN 3.7 3.2* 2.8*    Recent Labs Lab 11/02/14 0023  LIPASE 17*   Coagulation profile  Recent Labs Lab 11/02/14 1232 11/03/14 0510 11/04/14 0446 11/05/14 0410 11/06/14 0420  INR >10.00* 1.58* 1.84* 2.48* 3.63*    CBC:  Recent Labs Lab 11/02/14 0023  11/02/14 3354 11/03/14 0510 11/04/14 0446 11/05/14 0410 11/06/14 0420  WBC 12.2*  < > 13.4* 13.2* 12.3* 10.6* 10.4  NEUTROABS 10.1*  --  10.4*  --   --   --   --   HGB 13.7  < > 11.4* 8.5* 7.8* 7.9* 8.3*  HCT 41.4  < > 35.5* 25.8* 24.1* 24.8* 25.8*  MCV 89.2  < > 89.9 90.5 92.3 91.9 92.5  PLT 184  < > 165 163 158 163 198  < > = values in this interval not displayed.  Sepsis Labs:  Recent Labs Lab 11/02/14 0033  11/03/14 0510 11/04/14 0446 11/05/14 0410 11/06/14 0420  WBC  --   < > 13.2* 12.3* 10.6* 10.4  LATICACIDVEN 0.89  --   --   --   --   --   < > = values in this interval not displayed. Microbiology Recent Results (from the past 240 hour(s))  Clostridium Difficile by PCR     Status: None   Collection Time: 11/05/14  3:18 AM  Result Value Ref Range Status   C difficile by pcr NEGATIVE NEGATIVE Final     Medications:   . atorvastatin  40 mg Oral q1800  . calcitRIOL  0.25 mcg Oral Daily  . cefTRIAXone (ROCEPHIN)  IV  2 g Intravenous Q0600  . darifenacin   7.5 mg Oral Daily  . diltiazem  240 mg Oral Daily  . donepezil  5 mg Oral QHS  . folic acid  1 mg Oral Daily  . metronidazole  500 mg Intravenous 3 times per day  . mycophenolate  250 mg Oral BID  . pantoprazole (PROTONIX) IV  40 mg Intravenous Q12H  . potassium chloride  40 mEq Oral BID  . predniSONE  5 mg Oral Q breakfast  . sertraline  100 mg Oral Daily  . sodium chloride  3 mL Intravenous Q12H  . tacrolimus  1 mg Oral BID  . Warfarin - Pharmacist Dosing Inpatient   Does not apply q1800   Continuous Infusions: . sodium chloride 75 mL/hr at 11/05/14 2227    Time spent: 35 minutes with > 50% of time discussing current diagnostic test results, clinical impression and plan of care.   LOS: 4 days   [facility] Pager [phone]. If unable to reach me by pager, please call my cell phone at [phone].  *Please refer to [email or web address], password [email or web address] to get updated schedule on who will round on this patient, as hospitalists switch teams weekly. If 7PM-7AM, please contact night-coverage at [email or web address], password [email or web address] for any overnight needs.  11/06/2014, 8:21 AM

## 2014-11-07 ENCOUNTER — Inpatient Hospital Stay (HOSPITAL_COMMUNITY): Payer: Medicare Other

## 2014-11-07 ENCOUNTER — Encounter (HOSPITAL_COMMUNITY): Payer: Self-pay | Admitting: Radiology

## 2014-11-07 DIAGNOSIS — R100 Acute abdomen: Secondary | ICD-10-CM

## 2014-11-07 LAB — CBC
HCT: 27.1 % — ABNORMAL LOW (ref 36.0–46.0)
Hemoglobin: 8.7 g/dL — ABNORMAL LOW (ref 12.0–15.0)
MCH: 29.5 pg (ref 26.0–34.0)
MCHC: 32.1 g/dL (ref 30.0–36.0)
MCV: 91.9 fL (ref 78.0–100.0)
Platelets: 267 10*3/uL (ref 150–400)
RBC: 2.95 MIL/uL — ABNORMAL LOW (ref 3.87–5.11)
RDW: 14.4 % (ref 11.5–15.5)
WBC: 5.9 10*3/uL (ref 4.0–10.5)

## 2014-11-07 LAB — BASIC METABOLIC PANEL WITH GFR
Anion gap: 16 — ABNORMAL HIGH (ref 5–15)
BUN: 10 mg/dL (ref 6–20)
CO2: 18 mmol/L — ABNORMAL LOW (ref 22–32)
Calcium: 8.2 mg/dL — ABNORMAL LOW (ref 8.9–10.3)
Chloride: 105 mmol/L (ref 101–111)
Creatinine, Ser: 0.64 mg/dL (ref 0.44–1.00)
GFR calc Af Amer: >60 mL/min
GFR calc non Af Amer: >60 mL/min
Glucose, Bld: 112 mg/dL — ABNORMAL HIGH (ref 65–99)
Potassium: 3.3 mmol/L — ABNORMAL LOW (ref 3.5–5.1)
Sodium: 139 mmol/L (ref 135–145)

## 2014-11-07 LAB — MAGNESIUM: Magnesium: 1.7 mg/dL (ref 1.7–2.4)

## 2014-11-07 LAB — PROTIME-INR
INR: 3.12 — ABNORMAL HIGH (ref 0.00–1.49)
Prothrombin Time: 31.5 seconds — ABNORMAL HIGH (ref 11.6–15.2)

## 2014-11-07 MED ORDER — IOHEXOL 300 MG/ML  SOLN
100.0000 mL | Freq: Once | INTRAMUSCULAR | Status: AC | PRN
  Administered 2014-11-07: 100 mL via INTRAVENOUS

## 2014-11-07 MED ORDER — POTASSIUM CHLORIDE 10 MEQ/100ML IV SOLN
10.0000 meq | INTRAVENOUS | Status: AC
  Administered 2014-11-07 (×4): 10 meq via INTRAVENOUS
  Filled 2014-11-07 (×4): qty 100

## 2014-11-07 MED ORDER — IOHEXOL 300 MG/ML  SOLN
25.0000 mL | INTRAMUSCULAR | Status: AC
  Administered 2014-11-07: 25 mL via ORAL

## 2014-11-07 NOTE — Care Management Note (Signed)
Case Management Note  Patient Details  Name: Connie Ruiz MRN: [id number] Date of Birth: [birth year]  Subjective/Objective:                    Action/Plan:   Expected Discharge Date:              Expected Discharge Plan:  Home/Self Care  In-House Referral:     Discharge planning Services  CM Consult  Post Acute Care Choice:    Choice offered to:     DME Arranged:    DME Agency:     HH Arranged:    [place] Agency:     Status of Service:  In process, will continue to follow  Medicare Important Message Given:  Yes Date Medicare IM Given:  11/05/14 Medicare IM give by:  Dessa Phi Date Additional Medicare IM Given:    Additional Medicare Important Message give by:     If discussed at [place] of Stay Meetings, dates discussed:  11/07/14  Additional Comments:  Dessa Phi, RN 11/07/2014, 12:08 PM

## 2014-11-07 NOTE — Progress Notes (Signed)
assessment completed. No S/S of distress noted pt [id number]

## 2014-11-07 NOTE — Progress Notes (Signed)
Progress Note   Connie Ruiz ZJQ:[id number] DOB: September 17, 1960 DOA: 11/02/2014 PCP: Renato Shin, MD   Brief Narrative:   Connie Ruiz is an 54 y.o. female with complicated medical history including HTN, DM, lupus and antiphospholipid syndrome, s/p renal transplant requiring long term immunosuppressant, CVA in 2011, PE in 07/2010 and requiring long term AC with Coumadin, last known colonoscopy in [place] in 2002 which was reportedly unremarkable, admitted 11/02/14 with jejunitis being managed with Flagyl/Rocephin.  Hospital course has been complicated by persistent abd pain despite IV ABX regimen. Repeat CT abd and pelvis on 11/04/14 showed potentially worsening diverticulitis and GI team subsequently consulted for further assistance.   Assessment/Plan:   Principal Problem:  Acute abd pain secondary to jejunitis - Differential of etiology is infectious versus vasculitic secondary to lupus. ESR 65, ANA + 1:1280. - Repeat CT scan done today which shows improvement and bowel wall temporally related to steroid-induced initiation. - Continue empiric Flagyl/Rocephin. Solu-Medrol 60 mg IV every 12 hours started by GI 11/06/14. - Advance diet.  Active Problems:  Essential hypertension -  Continue Cardizem and Cozaar. Blood pressure stable.   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  - Pt is followed by Dr Annabelle Harman at [facility] for rheumatology.  - Prior workup showed negative dsDNA, SSA and SSB, negative Smith antibody, negative hepatitis profile. - Patient was ANA + 1:1280 with an abnormal lupus inhibitor. - Solu-Medrol started 11/06/14 due to concerns for vasculitis induced segmental jejunitis.   DVT (deep venous thrombosis) - Coumadin currently on hold, INR supratherapeutic at present.   Anemia of chronic disease, lupus and IDA - FOBT requested. No evidence of active significant GI  bleeding. - The patient is [name] and declines blood products for support. - Iron, B-12, folate WNL.   Depression - Continue Zoloft.    Hypokalemia / hypomagnesemia - We'll give additional potassium today. Was given magnesium yesterday.   Supratherapeutic INR - Was given 2 doses of vit K 5 mg IV on admission. - Coumadin currently on hold.    Obesity  - Body mass index is 31.2 kg/(m^2).    DVT prophylaxis  - On chronic anticoagulation with Coumadin.   Code Status: Full.  Family Communication: Updated her brother by telephone 11/06/14. No family at the bedside today. Disposition Plan: Home when pain better controlled and responding to treatment, likely another 1-2 days.    IV Access:    Peripheral IV   Procedures and diagnostic studies:   Ct Abdomen Pelvis W Contrast  11/07/2014   CLINICAL DATA:  Abdominal pain, inflammation of jejunal loops  EXAM: CT ABDOMEN AND PELVIS WITH CONTRAST  TECHNIQUE: Multidetector CT imaging of the abdomen and pelvis was performed using the standard protocol following bolus administration of intravenous contrast.  CONTRAST:  119m OMNIPAQUE IOHEXOL 300 MG/ML  SOLN  COMPARISON:  11/04/2014  FINDINGS: Lung bases shows small right pleural effusion with right lower lobe posteriorly atelectasis.  There is skin thickening and stranding of subcutaneous fat with subcutaneous edema in right flank wall. Clinical correlation is necessary to exclude cellulitis.  There is small perihepatic ascites. The patient is status postcholecystectomy.  Atherosclerotic calcifications of abdominal aorta and iliac arteries again noted. Again noted atrophic native kidneys. Normal appearing transplanted kidney in right lower quadrant.  There is significant improvement in the segmental inflammatory changes in jejunum. Only residual mild gaseous distended segmental jejunal loop at  the site of previous inflammation. Minimal residual thickened jejunal wall with  improvement from prior exam. Improvement in surrounding mesenteric inflammatory changes.  The pancreas, spleen and adrenal glands are unremarkable.  There is residual mild distension of the cecum with gas with improvement from prior exam. The cecum measures only 2.7 cm in diameter. Again the cecum is located in mid anterior abdomen suspicious for mobile cecum. There is no thickening of cecal wall. No evidence of cecal volvulus.  Abdominal wall scarring is noted in right lower quadrant. The uterus and adnexa are unremarkable. The urinary bladder is unremarkable. Fifth again noted descending colon and sigmoid colon diverticula. No evidence of acute diverticulitis.  IMPRESSION: 1. Significant improvement in segmental inflammatory changes of jejunum. Only residual mild segmental gaseous dilatation of jejunum. Minimal residual wall thickening with improvement from prior exam. Improvement in adjacent mesenteric stranding. 2. Residual mild gaseous distended cecum with improvement from prior exam. The cecum measures 2.7 cm in diameter. No evidence of cecal volvulus or thickening of cecal wall. 3. There is skin thickening and stranding of subcutaneous fat and subcutaneous edema in right flank wall. Cellulitis or subcutaneous edema cannot be excluded. Clinical correlation is necessary 4. Normal appearing transplanted kidney. Again noted atrophic native kidneys. No hydronephrosis. 5. No small bowel or colonic obstruction.   Electronically Signed   By: Lahoma Crocker M.D.   On: 11/07/2014 12:30   Ct Abdomen Pelvis W Contrast  11/04/2014   CLINICAL DATA:  Umbilical pain, abdominal pain, diarrhea  EXAM: CT ABDOMEN AND PELVIS WITH CONTRAST  TECHNIQUE: Multidetector CT imaging of the abdomen and pelvis was performed using the standard protocol following bolus administration of intravenous contrast.  CONTRAST:  159m OMNIPAQUE IOHEXOL 300 MG/ML  SOLN  COMPARISON:  5/28/ 16  FINDINGS: There is small right pleural effusion with right  lower lobe posterior atelectasis. Again noted perihepatic ascites increased in size from prior exam. Postcholecystectomy surgical clips are noted. There is contrast material in distal esophagus probable from gastroesophageal reflux.  There is no evidence of gastric outlet obstruction.  No evidence of small bowel obstruction. Again noted significant segmental thickening of jejunal wall in left lower quadrant best seen in axial image 54. Mild worsening from prior exam. Wall thickness measures at least 1.1 cm. Again findings consistent with segmental enteritis probable infectious. Atherosclerotic calcifications of abdominal aorta and bilateral iliac artery again noted. Atherosclerotic calcifications bilateral renal artery origin. Atherosclerotic calcifications of celiac trunk and SMA origin.  There is again noted mild stranding of mesenteric fat adjacent to inflamed segment of small bowel in left abdomen. Small mesenteric lymph nodes are noted at this level probable reactive.  The native kidneys are small in size atrophic. Stable lesion seen native kidneys. Again noted normal function an appearance of transplanted kidney in right lower quadrant.  There is significant gaseous distension of the cecum in left mid abdomen anteriorly. The cecum measures at least 6 cm in diameter suspicious for segmental sickle ileus. There is no evidence of small bowel obstruction. Contrast material is noted in right colon and transverse colon. 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 Small amount of ascites is noted in right paracolic gutter. Normal appendix is partially visualized in axial image 59. There is small amount of pelvic ascites in right lower pelvis. The uterus and adnexa are unremarkable. No distal colonic obstruction. Few colonic diverticula are noted in left colon. There is contrast material in  left colon and sigmoid colon.  Delayed  renal images shows normal excretion of transplanted kidney.  Sagittal images of the spine are unremarkable. The pancreas, spleen and adrenal glands are unremarkable. No focal hepatic mass. Status postcholecystectomy.  IMPRESSION: 1. There is slight worsening in segmental inflammatory changes in jejunum in left abdomen. Slight worsening of thickening of the jejunal wall up to 1.1 cm. Again findings consistent with segmental enteritis. This may be infectious in nature or due to lupus enteritis 2. Mild increased perihepatic ascites. 3. There is small right pleural effusion with right lower lobe posterior atelectasis. 4. There is significant gaseous distension of the cecum. Measures up to 6 cm. There is mild angulation and anterior position of the cecum . Findings suspicious for mobile cecum. There is no thickening of cecal wall to suggest cecal volvulus. There is no evidence of obstruction. Cecal ileus cannot be excluded. 5. Normal appendix. No colonic obstruction. Contrast material is noted throughout the colon. 6. Small amount of pelvic ascites. 7. Normal function and appearance of transplanted kidney. Again noted atrophic native kidneys.   Electronically Signed   By: Lahoma Crocker M.D.   On: 11/04/2014 17:02   Ct Abdomen Pelvis W Contrast  11/02/2014   CLINICAL DATA:  Acute abdominal pain  EXAM: CT ABDOMEN AND PELVIS WITH CONTRAST  TECHNIQUE: Multidetector CT imaging of the abdomen and pelvis was performed using the standard protocol following bolus administration of intravenous contrast.  CONTRAST:  120m OMNIPAQUE IOHEXOL 300 MG/ML  SOLN  COMPARISON:  06/28/2014  FINDINGS: BODY WALL: No contributory findings.  LOWER CHEST: Extensive coronary atherosclerosis for age  ABDOMEN/PELVIS:  Liver: No focal abnormality.  Biliary: Cholecystectomy with chronic intra and extrahepatic biliary duct enlargement  Pancreas: Unremarkable.  Spleen: Single coarse calcification, likely granulomatous.  Adrenals: Unremarkable.  Kidneys  and ureters: There is a transplant kidney in the right lower quadrant, already excreting contrast. Would always consult with transplant surgeon prior to administering IV contrast. Severe native renal atrophy with stable low-density renal lesions which are indeterminate due to soft tissue density, but size stable since 2013, 22 mm on the left and 16 mm on the right.  Bladder: Unremarkable.  Reproductive: No pathologic findings.  Bowel: There is severe wall thickening of a jejunum segment with low-density submucosal appearance consistent with edema. The mesentery is diffusely edematous. The associated vessels appear widely patent. There is no evidence of perforation or abscess. No bowel obstruction. Distal colonic diverticulosis. Normal appendix.  Retroperitoneum: No mass or adenopathy.  Peritoneum: Small ascites around the liver and in the pelvis, considered reactive.  Vascular: No acute abnormality.  OSSEOUS: No acute abnormalities.  IMPRESSION: 1. Severe short-segment jejunitis, likely infectious or lupus enteritis (given patient's history). 2. Bilateral native renal lesions are indeterminate due to density, but size stable from 2013. 3. Additional chronic findings are stable from prior noted above.   Electronically Signed   By: JMonte FantasiaM.D.   On: 11/02/2014 04:41     Medical Consultants:    Gastroenterology  Anti-Infectives:    Rocephin 5/28 -->  Flagyl 5/28 -->  Subjective:   Connie Ruiz reports some mild improvement in abdominal pain. Wants to eat. No nausea or vomiting. Occasional loose stools, no frank blood.  Objective:    Filed Vitals:   11/06/14 2109 11/07/14 0511 11/07/14 1034 11/07/14 1353  BP: 137/74 126/64 131/68 140/78  Pulse: 93 84  90  Temp: 98.2 F (36.8 C) 98.1 F (36.7 C)  98.1 F (36.7 C)  TempSrc: Oral Oral  Oral  Resp: '18 18  16  ' Height:      Weight:  84.7 kg (186 lb 11.7 oz)    SpO2: 97% 97%  98%    Intake/Output Summary (Last 24 hours) at  11/07/14 1455 Last data filed at 11/07/14 1037  Gross per 24 hour  Intake 1559.25 ml  Output    350 ml  Net 1209.25 ml    Exam: Gen:  NAD Cardiovascular:  RRR, No M/R/G Respiratory:  Lungs CTAB, diminished in the bases. Gastrointestinal:  Abdomen soft,  tender in the lower quadrants + BS Extremities:  No C/E/C   Data Reviewed:    Labs: Basic Metabolic Panel:  Recent Labs Lab 11/03/14 0510 11/04/14 0446 11/05/14 0410 11/06/14 0420 11/07/14 0440 11/07/14 0507  NA 138 138 139 142  --  139  K 3.4* 3.5 2.9* 3.0*  --  3.3*  CL 110 111 107 104  --  105  CO2 20* 20* 22 23  --  18*  GLUCOSE 129* 106* 78 69  --  112*  BUN 29* 20 9 5*  --  10  CREATININE 0.83 0.72 0.63 0.65  --  0.64  CALCIUM 8.4* 8.2* 8.1* 8.4*  --  8.2*  MG  --   --   --  1.3* 1.7  --    GFR Estimated Creatinine Clearance: 84.6 mL/min (by C-G formula based on Cr of 0.64). Liver Function Tests:  Recent Labs Lab 11/02/14 0023 11/02/14 0639 11/06/14 0420  AST '31 24 16  ' ALT '30 26 14  ' ALKPHOS 79 70 57  BILITOT 1.4* 1.0 1.3*  PROT 7.0 6.0* 5.6*  ALBUMIN 3.7 3.2* 2.8*    Recent Labs Lab 11/02/14 0023  LIPASE 17*   Coagulation profile  Recent Labs Lab 11/03/14 0510 11/04/14 0446 11/05/14 0410 11/06/14 0420 11/07/14 0507  INR 1.58* 1.84* 2.48* 3.63* 3.12*    CBC:  Recent Labs Lab 11/02/14 0023  11/02/14 2904 11/03/14 0510 11/04/14 0446 11/05/14 0410 11/06/14 0420 11/07/14 0507  WBC 12.2*  < > 13.4* 13.2* 12.3* 10.6* 10.4 5.9  NEUTROABS 10.1*  --  10.4*  --   --   --   --   --   HGB 13.7  < > 11.4* 8.5* 7.8* 7.9* 8.3* 8.7*  HCT 41.4  < > 35.5* 25.8* 24.1* 24.8* 25.8* 27.1*  MCV 89.2  < > 89.9 90.5 92.3 91.9 92.5 91.9  PLT 184  < > 165 163 158 163 198 267  < > = values in this interval not displayed.  Sepsis Labs:  Recent Labs Lab 11/02/14 0033  11/04/14 0446 11/05/14 0410 11/06/14 0420 11/07/14 0507  WBC  --   < > 12.3* 10.6* 10.4 5.9  LATICACIDVEN 0.89  --   --   --    --   --   < > = values in this interval not displayed. Microbiology Recent Results (from the past 240 hour(s))  Clostridium Difficile by PCR     Status: None   Collection Time: 11/05/14  3:18 AM  Result Value Ref Range Status   C difficile by pcr NEGATIVE NEGATIVE Final     Medications:   . atorvastatin  40 mg Oral q1800  . calcitRIOL  0.25 mcg Oral Daily  . cefTRIAXone (ROCEPHIN)  IV  2 g Intravenous Q0600  . darifenacin  7.5 mg Oral Daily  . diltiazem  240 mg Oral Daily  . donepezil  5 mg Oral QHS  .  folic acid  1 mg Oral Daily  . losartan  100 mg Oral Daily  . methylPREDNISolone (SOLU-MEDROL) injection  60 mg Intravenous Q12H  . metronidazole  500 mg Intravenous 3 times per day  . mycophenolate  250 mg Oral BID  . pantoprazole (PROTONIX) IV  40 mg Intravenous Q12H  . potassium chloride  40 mEq Oral BID  . sertraline  100 mg Oral Daily  . sodium chloride  3 mL Intravenous Q12H  . tacrolimus  1 mg Oral BID  . Warfarin - Pharmacist Dosing Inpatient   Does not apply q1800   Continuous Infusions: . sodium chloride 75 mL/hr at 11/06/14 2219    Time spent: 35 minutes with > 50% of time discussing current diagnostic test results, clinical impression and plan of care.   LOS: 5 days   [facility] Pager [phone]. If unable to reach me by pager, please call my cell phone at [phone].  *Please refer to [email or web address], password [email or web address] to get updated schedule on who will round on this patient, as hospitalists switch teams weekly. If 7PM-7AM, please contact night-coverage at [email or web address], password [email or web address] for any overnight needs.  11/07/2014, 2:55 PM

## 2014-11-07 NOTE — Progress Notes (Signed)
ANTICOAGULATION CONSULT NOTE - Follow Up Consult  Pharmacy Consult for warfarin Indication: DVT and stroke  Allergies  Allergen Reactions  . Oxycodone-Acetaminophen Shortness Of Breath and Nausea Only  . Propoxyphene N-Acetaminophen Shortness Of Breath and Nausea Only  . Sulfonamide Derivatives Shortness Of Breath and Nausea Only  . Codeine Nausea Only  . Latex Rash  . Metoprolol Rash   Patient Measurements: Height: 5\' 4"  (162.6 cm) Weight: 186 lb 11.7 oz (84.7 kg) IBW/kg (Calculated) : 54.7  Vital Signs: Temp: 98.1 F (36.7 C) (06/02 0511) Temp Source: Oral (06/02 0511) BP: 126/64 mmHg (06/02 0511) Pulse Rate: 84 (06/02 0511)  Labs:  Recent Labs  11/05/14 0410 11/06/14 0420 11/07/14 0507  HGB 7.9* 8.3* 8.7*  HCT 24.8* 25.8* 27.1*  PLT 163 198 267  LABPROT 26.6* 35.4* 31.5*  INR 2.48* 3.63* 3.12*  CREATININE 0.63 0.65 0.64   Estimated Creatinine Clearance: 84.6 mL/min (by C-G formula based on Cr of 0.64).  Medications:  Scheduled:  . atorvastatin  40 mg Oral q1800  . calcitRIOL  0.25 mcg Oral Daily  . cefTRIAXone (ROCEPHIN)  IV  2 g Intravenous Q0600  . darifenacin  7.5 mg Oral Daily  . diltiazem  240 mg Oral Daily  . donepezil  5 mg Oral QHS  . folic acid  1 mg Oral Daily  . iohexol  25 mL Oral Q1 Hr x 2  . losartan  100 mg Oral Daily  . methylPREDNISolone (SOLU-MEDROL) injection  60 mg Intravenous Q12H  . metronidazole  500 mg Intravenous 3 times per day  . mycophenolate  250 mg Oral BID  . pantoprazole (PROTONIX) IV  40 mg Intravenous Q12H  . potassium chloride  10 mEq Intravenous Q1 Hr x 4  . potassium chloride  40 mEq Oral BID  . sertraline  100 mg Oral Daily  . sodium chloride  3 mL Intravenous Q12H  . tacrolimus  1 mg Oral BID  . Warfarin - Pharmacist Dosing Inpatient   Does not apply q1800   Assessment:  21 yoF admitted with c/o diffuse abd pain, N/V. 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 Jejunitis on CT,  treating with Cipro/Flagyl. Other hx: Lupus, Renal transplant on immunomodulators. INR > 10 on admission, treated with IV/PO Vit K + FFP.  Warfarin resumed 5/29 with INR 1.58.  Today 11/07/2014  INR down to 3.12 s/p vit K 5mg  PO x1 on 6/1  Hgb low but slightly improved from yesterday, Plt wnl  Flagyl can increase INR, poor po intake  No bleeding documented  Per GI note 5/31: "Will consider enteroscopy for evaluation of small bowel tomorrow if INR closer to normal range. Will need to stop warfarin today and let INR drift to 1.6 or lower"  Goal of Therapy:  INR 2-3   Plan:   Continue to hold coumadin today  Daily INR  Monitor for signs of bleeding or thrombosis  Pharmacy will sign off for ceftriaxone as current dose is appropriate for indication and no renal adjustment is needed for this abx  Dia Sitter, PharmD, BCPS 11/07/2014 9:45 AM

## 2014-11-07 NOTE — Progress Notes (Signed)
Assumed care of pt at this time. Pt is stable with no complaints. Will continue to monitor.  Connie Ruiz [facility] 11/07/2014

## 2014-11-07 NOTE — Progress Notes (Signed)
     [facility] Progress Note  Subjective:      Less abd pain. No N/V. Passing some gas. Started on solumedrol yesterday and feels abd pain improving, but not better yet.   Objective:  Vital signs in last 24 hours: Temp:  [98.1 F (36.7 C)-98.2 F (36.8 C)] 98.1 F (36.7 C) (06/02 0511) Pulse Rate:  [84-93] 84 (06/02 0511) Resp:  [17-18] 18 (06/02 0511) BP: (126-137)/(64-74) 126/64 mmHg (06/02 0511) SpO2:  [95 %-97 %] 97 % (06/02 0511) Weight:  [186 lb 11.7 oz (84.7 kg)] 186 lb 11.7 oz (84.7 kg) (06/02 0511) Last BM Date: 11/05/14 General:   Alert,  Well-developed, in NAD Heart:  Regular rate and rhythm; no murmurs Pulm;lungs clear Abdomen:  Soft, mild diffuse tenderness,nondistended. Normal bowel sounds, without guarding, and without rebound.   Extremities:  Without edema. Neurologic:  Alert and  oriented x4;  grossly normal neurologically. Psych: Alert and cooperative. Normal mood and affect.  Intake/Output from previous day: 06/01 0701 - 06/02 0700 In: 2150 [I.V.:1800; IV Piggyback:350] Out: 350 [Urine:350] Intake/Output this shift:    Lab Results:  Recent Labs  11/05/14 0410 11/06/14 0420 11/07/14 0507  WBC 10.6* 10.4 5.9  HGB 7.9* 8.3* 8.7*  HCT 24.8* 25.8* 27.1*  PLT 163 198 267   BMET  Recent Labs  11/05/14 0410 11/06/14 0420 11/07/14 0507  NA 139 142 139  K 2.9* 3.0* 3.3*  CL 107 104 105  CO2 22 23 18*  GLUCOSE 78 69 112*  BUN 9 5* 10  CREATININE 0.63 0.65 0.64  CALCIUM 8.1* 8.4* 8.2*   LFT  Recent Labs  11/06/14 0420  PROT 5.6*  ALBUMIN 2.8*  AST 16  ALT 14  ALKPHOS 57  BILITOT 1.3*   PT/INR  Recent Labs  11/06/14 0420 11/07/14 0507  LABPROT 35.4* 31.5*  INR 3.63* 3.12*     ASSESSMENT/PLAN:   54 yo female admitted with abd pain found to have jejunitis involving about 1 foot of jejunum. WBC 5.9, hgb stable.Seems to feel a little beter since initiation od solumedrol. Will check repeat CT scan.      LOS: 5  days   Hvozdovic, Deloris Ping 11/07/2014, Pager [phone] Attending MD note:   I have taken a history, examined the patient, and reviewed the chart. I agree with the Advanced Practitioner's impression and recommendations. CT scan  Reveals marked improvement in the jejunal edema, the wall of the jejunum is close to normal thickness and the lumen is open. Will continue steroids, later switch to oral. Will advance diet   [facility] Pager # [phone]

## 2014-11-08 LAB — CBC
HCT: 27.4 % — ABNORMAL LOW (ref 36.0–46.0)
Hemoglobin: 8.9 g/dL — ABNORMAL LOW (ref 12.0–15.0)
MCH: 28.9 pg (ref 26.0–34.0)
MCHC: 32.5 g/dL (ref 30.0–36.0)
MCV: 89 fL (ref 78.0–100.0)
Platelets: 312 10*3/uL (ref 150–400)
RBC: 3.08 MIL/uL — ABNORMAL LOW (ref 3.87–5.11)
RDW: 14.8 % (ref 11.5–15.5)
WBC: 7.1 10*3/uL (ref 4.0–10.5)

## 2014-11-08 LAB — BASIC METABOLIC PANEL
Anion gap: 7 (ref 5–15)
BUN: 14 mg/dL (ref 6–20)
CO2: 25 mmol/L (ref 22–32)
Calcium: 8.5 mg/dL — ABNORMAL LOW (ref 8.9–10.3)
Chloride: 105 mmol/L (ref 101–111)
Creatinine, Ser: 0.5 mg/dL (ref 0.44–1.00)
GFR calc Af Amer: >60 mL/min (ref >60)
GFR calc non Af Amer: >60 mL/min (ref >60)
Glucose, Bld: 230 mg/dL — ABNORMAL HIGH (ref 65–99)
Potassium: 3.4 mmol/L — ABNORMAL LOW (ref 3.5–5.1)
Sodium: 137 mmol/L (ref 135–145)

## 2014-11-08 LAB — PROTIME-INR
INR: 3.47 — ABNORMAL HIGH (ref 0.00–1.49)
Prothrombin Time: 34.2 seconds — ABNORMAL HIGH (ref 11.6–15.2)

## 2014-11-08 LAB — CLOSTRIDIUM DIFFICILE BY PCR: Toxigenic C. Difficile by PCR: NEGATIVE

## 2014-11-08 MED ORDER — POTASSIUM CHLORIDE 10 MEQ/100ML IV SOLN
10.0000 meq | INTRAVENOUS | Status: AC
  Administered 2014-11-08 (×4): 10 meq via INTRAVENOUS
  Filled 2014-11-08 (×4): qty 100

## 2014-11-08 NOTE — Care Management Note (Signed)
Case Management Note  Patient Details  Name: Connie Ruiz MRN: [id number] Date of Birth: January 19, 1961  Subjective/Objective:   Await PT recommendations.                 Action/Plan:   Expected Discharge Date:                 Expected Discharge Plan:  Home/Self Care  In-House Referral:     Discharge planning Services  CM Consult  Post Acute Care Choice:    Choice offered to:     DME Arranged:    DME Agency:     HH Arranged:    HH Agency:     Status of Service:  In process, will continue to follow  Medicare Important Message Given:  Yes Date Medicare IM Given:  11/05/14 Medicare IM give by:  Dessa Phi Date Additional Medicare IM Given:  11/08/14 Additional Medicare Important Message give by:  Dessa Phi  If discussed at Long Length of Stay Meetings, dates discussed:    Additional Comments:  Dessa Phi, RN 11/08/2014, 1:12 PM

## 2014-11-08 NOTE — Progress Notes (Addendum)
ANTICOAGULATION CONSULT NOTE - Follow Up Consult  Pharmacy Consult for warfarin Indication:hx PE, DVT and stroke  Allergies  Allergen Reactions  . Oxycodone-Acetaminophen Shortness Of Breath and Nausea Only  . Propoxyphene N-Acetaminophen Shortness Of Breath and Nausea Only  . Sulfonamide Derivatives Shortness Of Breath and Nausea Only  . Codeine Nausea Only  . Latex Rash  . Metoprolol Rash   Patient Measurements: Height: 5\' 4"  (162.6 cm) Weight: 186 lb 8.2 oz (84.6 kg) IBW/kg (Calculated) : 54.7  Vital Signs: Temp: 98.1 F (36.7 C) (06/03 0625) Temp Source: Oral (06/03 0625) BP: 122/76 mmHg (06/03 0625) Pulse Rate: 98 (06/03 0625)  Labs:  Recent Labs  11/06/14 0420 11/07/14 0507 11/08/14 0433  HGB 8.3* 8.7* 8.9*  HCT 25.8* 27.1* 27.4*  PLT 198 267 312  LABPROT 35.4* 31.5* 34.2*  INR 3.63* 3.12* 3.47*  CREATININE 0.65 0.64 0.50   Estimated Creatinine Clearance: 84.6 mL/min (by C-G formula based on Cr of 0.5).  Medications:  Scheduled:  . atorvastatin  40 mg Oral q1800  . calcitRIOL  0.25 mcg Oral Daily  . cefTRIAXone (ROCEPHIN)  IV  2 g Intravenous Q0600  . darifenacin  7.5 mg Oral Daily  . diltiazem  240 mg Oral Daily  . donepezil  5 mg Oral QHS  . folic acid  1 mg Oral Daily  . losartan  100 mg Oral Daily  . methylPREDNISolone (SOLU-MEDROL) injection  60 mg Intravenous Q12H  . metronidazole  500 mg Intravenous 3 times per day  . mycophenolate  250 mg Oral BID  . pantoprazole (PROTONIX) IV  40 mg Intravenous Q12H  . potassium chloride  10 mEq Intravenous Q1 Hr x 4  . potassium chloride  40 mEq Oral BID  . sertraline  100 mg Oral Daily  . sodium chloride  3 mL Intravenous Q12H  . tacrolimus  1 mg Oral BID  . Warfarin - Pharmacist Dosing Inpatient   Does not apply q1800   Assessment:  Connie Ruiz admitted with c/o diffuse abd pain, N/V. 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 Jejunitis on CT, treating with Cipro/Flagyl. Other  hx: Lupus, Renal transplant on immunomodulators. INR > 10 on admission, treated with IV/PO Vit K + FFP.  Warfarin resumed 5/29 with INR 1.58.   Today 11/08/2014  INR now up 3.47 (s/p vit K 5mg  PO x1 on 6/1)  Hgb low but stable, plt ok  Flagyl can increase INR, poor po intake  No bleeding documented  Goal of Therapy:  INR 2-3   Plan:   Continue to hold warfarin today  Daily INR  Monitor for signs of bleeding or thrombosis  Once INR<3 and plan is not to pursue enteroscopy or other procedures, please advice if/when you would like [place] to resume warfarin  Dia Sitter, PharmD, BCPS 11/08/2014 8:22 AM  [place]: Per Dr. Rockne Menghini, ok to resume warfarin when INR <3 Dia Sitter, PharmD, BCPS 11/08/2014 11:19 AM

## 2014-11-08 NOTE — Progress Notes (Signed)
[facility] Progress Note  Subjective:  CT scan with improvement of jejunitis. Pt on full liquids--has some intermittent nausea but has not vomited. Pt reports 4 loose BMs through night, but nurses report one loose BM last pm and one this am. currently on rocephin   Objective:  Vital signs in last 24 hours: Temp:  [98.1 F (36.7 C)-98.2 F (36.8 C)] 98.1 F (36.7 C) (06/03 0625) Pulse Rate:  [90-98] 98 (06/03 0625) Resp:  [16-18] 18 (06/03 0625) BP: (120-140)/(64-78) 122/76 mmHg (06/03 0625) SpO2:  [98 %] 98 % (06/03 0625) Weight:  [186 lb 8.2 oz (84.6 kg)] 186 lb 8.2 oz (84.6 kg) (06/03 0625) Last BM Date: 11/05/14 General:   Alert,  Well-developed,    in NAD Heart:  Regular rate and rhythm; no murmurs Pulm;lungs clear Abdomen:  Soft, mild diffuse tenderness, nondistended. Normal bowel sounds, without guarding, and without rebound.   Extremities: L upper arm with some swelling around IV site (nurses aware and have called IV team) Neurologic: Alert and  oriented x4;  grossly normal neurologically. Psych: Alert and cooperative. Normal mood and affect.  Intake/Output from previous day: 06/02 0701 - 06/03 0700 In: 1144.3 [I.V.:644.3; IV Piggyback:500] Out: -  Intake/Output this shift:    Lab Results:  Recent Labs  11/06/14 0420 11/07/14 0507 11/08/14 0433  WBC 10.4 5.9 7.1  HGB 8.3* 8.7* 8.9*  HCT 25.8* 27.1* 27.4*  PLT 198 267 312   BMET  Recent Labs  11/06/14 0420 11/07/14 0507 11/08/14 0433  NA 142 139 137  K 3.0* 3.3* 3.4*  CL 104 105 105  CO2 23 18* 25  GLUCOSE 69 112* 230*  BUN 5* 10 14  CREATININE 0.65 0.64 0.50  CALCIUM 8.4* 8.2* 8.5*   LFT  Recent Labs  11/06/14 0420  PROT 5.6*  ALBUMIN 2.8*  AST 16  ALT 14  ALKPHOS 57  BILITOT 1.3*   PT/INR  Recent Labs  11/07/14 0507 11/08/14 0433  LABPROT 31.5* 34.2*  INR 3.12* 3.47*      Ct Abdomen Pelvis W Contrast  11/07/2014   CLINICAL DATA:  Abdominal pain,  inflammation of jejunal loops  EXAM: CT ABDOMEN AND PELVIS WITH CONTRAST  TECHNIQUE: Multidetector CT imaging of the abdomen and pelvis was performed using the standard protocol following bolus administration of intravenous contrast.  CONTRAST:  137mL OMNIPAQUE IOHEXOL 300 MG/ML  SOLN  COMPARISON:  11/04/2014  FINDINGS: Lung bases shows small right pleural effusion with right lower lobe posteriorly atelectasis.  There is skin thickening and stranding of subcutaneous fat with subcutaneous edema in right flank wall. Clinical correlation is necessary to exclude cellulitis.  There is small perihepatic ascites. The patient is status postcholecystectomy.  Atherosclerotic calcifications of abdominal aorta and iliac arteries again noted. Again noted atrophic native kidneys. Normal appearing transplanted kidney in right lower quadrant.  There is significant improvement in the segmental inflammatory changes in jejunum. Only residual mild gaseous distended segmental jejunal loop at the site of previous inflammation. Minimal residual thickened jejunal wall with improvement from prior exam. Improvement in surrounding mesenteric inflammatory changes.  The pancreas, spleen and adrenal glands are unremarkable.  There is residual mild distension of the cecum with gas with improvement from prior exam. The cecum measures only 2.7 cm in diameter. Again the cecum is located in mid anterior abdomen suspicious for mobile cecum. There is no thickening of cecal wall. No evidence of cecal volvulus.  Abdominal wall scarring is noted in right lower  quadrant. The uterus and adnexa are unremarkable. The urinary bladder is unremarkable. Fifth again noted descending colon and sigmoid colon diverticula. No evidence of acute diverticulitis.  IMPRESSION: 1. Significant improvement in segmental inflammatory changes of jejunum. Only residual mild segmental gaseous dilatation of jejunum. Minimal residual wall thickening with improvement from prior exam.  Improvement in adjacent mesenteric stranding. 2. Residual mild gaseous distended cecum with improvement from prior exam. The cecum measures 2.7 cm in diameter. No evidence of cecal volvulus or thickening of cecal wall. 3. There is skin thickening and stranding of subcutaneous fat and subcutaneous edema in right flank wall. Cellulitis or subcutaneous edema cannot be excluded. Clinical correlation is necessary 4. Normal appearing transplanted kidney. Again noted atrophic native kidneys. No hydronephrosis. 5. No small bowel or colonic obstruction.   Electronically Signed   By: Lahoma Crocker M.D.   On: 11/07/2014 12:30    ASSESSMENT/PLAN:   54 yo female admitted with abd pain found to have jejunitis involving about 1 foot of jejunum.WBC 7.1, hbg stable. Spoke to nurse--if pt has another BM that is loose, will send for cdiff and place on precautions.     LOS: 6 days   Connie Ruiz, Deloris Ping 11/08/2014, Pager [phone]  Attending MD note:   I have taken a history, examined the patient, and reviewed the chart. I agree with the Advanced Practitioner's impression and recommendations. I think the diarrhea is a results of CT scan contrast. .Would continue IV steroids little longer, still having pain but wants to stay on full liquids.  [facility] Pager # [phone]

## 2014-11-08 NOTE — Evaluation (Signed)
Physical Therapy Evaluation Patient Details Name: Connie Ruiz MRN: [id number] DOB: [birth year] Today's Date: 11/08/2014   History of Present Illness  Patient is a 54 y/o female admitted with abdominal pain, positive for jejunitis.  PMH positive for antiphospholipid syndrome, renal transplant, CVA, DVT w/ PE, DM, lupus.  Clinical Impression  Patient presents with poor motivation for mobility limited by pain along with deficits listed in PT problem list.  She would not mobilize with me today despite max encouragement from me and RN.  She may need SNF rehab but not able to fully assess follow up needs until she will agree to participate more than just bed level activity.  Will attempt again tomorrow if pt will participate.      Follow Up Recommendations Other (comment) (follow up recommendations TBA after pt mobilizes to see level of independence)    Equipment Recommendations  None recommended by PT    Recommendations for Other Services       Precautions / Restrictions Precautions Precautions: Fall Precaution Comments: history of falls at home      Mobility  Bed Mobility               General bed mobility comments: NT, educated pt how to roll to side prior to sitting and even use pillow to splint, but she refused all mobility despite PT and RN insistance and encouragement  Transfers                    Ambulation/Gait                Stairs            Wheelchair Mobility    Modified Rankin (Stroke Patients Only)       Balance                                             Pertinent Vitals/Pain Pain Assessment: Faces Faces Pain Scale: Hurts whole lot Pain Location: abdomen Pain Intervention(s): RN gave pain meds during session;Premedicated before session    Home Living Family/patient expects to be discharged to:: Private residence Living Arrangements: Alone Available Help at Discharge: Personal care attendant Type of Home:  Apartment Home Access: Level entry     Home Layout: One level Home Equipment: Environmental consultant - 2 wheels;Cane - single point;Bedside commode;Wheelchair - manual;Grab bars - tub/shower;Grab bars - toilet      Prior Function Level of Independence: Needs assistance   Gait / Transfers Assistance Needed: ambulates without device  ADL's / Homemaking Assistance Needed: assist for bathing and occasionally for dressing/cooking, etc  Comments: mild dysarthria     Hand Dominance   Dominant Hand: Left    Extremity/Trunk Assessment   Upper Extremity Assessment: RUE deficits/detail RUE Deficits / Details: AROM WFL, strength about 4-/5         Lower Extremity Assessment: Generalized weakness         Communication   Communication: No difficulties  Cognition Arousal/Alertness: Awake/alert Behavior During Therapy: Flat affect Overall Cognitive Status: No family/caregiver present to determine baseline cognitive functioning                      General Comments      Exercises        Assessment/Plan    PT Assessment Patient needs continued PT services  PT Diagnosis Generalized weakness  PT Problem List Decreased strength;Decreased mobility;Decreased activity tolerance;Decreased safety awareness  PT Treatment Interventions DME instruction;Therapeutic exercise;Gait training;Balance training;Functional mobility training;Therapeutic activities;Patient/family education   PT Goals (Current goals can be found in the Care Plan section) Acute Rehab PT Goals Patient Stated Goal: To go home PT Goal Formulation: With patient Time For Goal Achievement: 11/22/14    Frequency Min 3X/week   Barriers to discharge Decreased caregiver support      Co-evaluation               End of Session   Activity Tolerance: Patient limited by pain Patient left: in bed;with call bell/phone within reach           Time: [id number] PT Time Calculation (min) (ACUTE ONLY): 16  min   Charges:   PT Evaluation $Initial PT Evaluation Tier I: 1 Procedure     PT G Codes:        Connie Ruiz,Connie Ruiz 2014/11/18, 3:02 PM  Magda Kiel, [place] 18-Nov-2014

## 2014-11-08 NOTE — Progress Notes (Signed)
Progress Note   Connie Ruiz:[id number] DOB: Oct 06, 1960 DOA: 11/02/2014 PCP: Renato Shin, MD   Brief Narrative:   Connie Ruiz is an 54 y.o. female with complicated medical history including HTN, DM, lupus and antiphospholipid syndrome, s/p renal transplant requiring long term immunosuppressant, CVA in 2011, PE in 07/2010 and requiring long term AC with Coumadin, last known colonoscopy in [place] in 2002 which was reportedly unremarkable, admitted 11/02/14 with jejunitis being managed with Flagyl/Rocephin.  Hospital course has been complicated by persistent abd pain despite IV ABX regimen. Repeat CT abd and pelvis on 11/04/14 showed potentially worsening diverticulitis and GI team subsequently consulted for further assistance.   Assessment/Plan:   Principal Problem:  Acute abd pain secondary to jejunitis - Differential of etiology is infectious versus vasculitic secondary to lupus. ESR 65, ANA + 1:1280. - Repeat CT scan done 11/07/14 which showed improvement in bowel wall temporally related to steroid initiation. - Continue empiric Flagyl/Rocephin and Solu-Medrol.  - Diet advanced 11/07/14.  Tolerating FL despite reports that she does not feel any better.  Active Problems:  Essential hypertension -  Continue Cardizem and Cozaar. Blood pressure stable.   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  - Pt is followed by Dr Annabelle Harman at [facility] for rheumatology.  - Prior workup showed negative dsDNA, SSA and SSB, negative Smith antibody, negative hepatitis profile. - Patient was ANA + 1:1280 with an abnormal lupus inhibitor. - Solu-Medrol started 11/06/14 due to concerns for vasculitis induced segmental jejunitis.   DVT (deep venous thrombosis) - Coumadin currently on hold, INR supratherapeutic at present.   Anemia of chronic disease, lupus and IDA - FOBT requested. No evidence  of active significant GI bleeding. - The patient is [name] and declines blood products for support. - Iron, B-12, folate WNL.   Depression - Continue Zoloft.    Hypokalemia / hypomagnesemia - We'll give additional potassium today. Of note, the patient refused potassium supplementation yesterday.   Supratherapeutic INR - Was given 2 doses of vit K 5 mg IV on admission. - Coumadin currently on hold.    Obesity  - Body mass index is 31.2 kg/(m^2).    DVT prophylaxis  - On chronic anticoagulation with Coumadin. INR therapeutic.  Code Status: Full.  Family Communication: Updated her brother by telephone 11/06/14. No family at the bedside today, and patient declines my offer to call. Disposition Plan: Home when pain better controlled and responding to treatment, likely another 1-2 days.    IV Access:    Peripheral IV   Procedures and diagnostic studies:   Ct Abdomen Pelvis W Contrast  11/07/2014   CLINICAL DATA:  Abdominal pain, inflammation of jejunal loops  EXAM: CT ABDOMEN AND PELVIS WITH CONTRAST  TECHNIQUE: Multidetector CT imaging of the abdomen and pelvis was performed using the standard protocol following bolus administration of intravenous contrast.  CONTRAST:  142m OMNIPAQUE IOHEXOL 300 MG/ML  SOLN  COMPARISON:  11/04/2014  FINDINGS: Lung bases shows small right pleural effusion with right lower lobe posteriorly atelectasis.  There is skin thickening and stranding of subcutaneous fat with subcutaneous edema in right flank wall. Clinical correlation is necessary to exclude cellulitis.  There is small perihepatic ascites. The patient is status postcholecystectomy.  Atherosclerotic calcifications of abdominal aorta and iliac arteries again noted. Again noted atrophic native kidneys. Normal appearing transplanted kidney in right lower quadrant.  There is significant  improvement in the segmental inflammatory changes in jejunum. Only residual mild gaseous distended  segmental jejunal loop at the site of previous inflammation. Minimal residual thickened jejunal wall with improvement from prior exam. Improvement in surrounding mesenteric inflammatory changes.  The pancreas, spleen and adrenal glands are unremarkable.  There is residual mild distension of the cecum with gas with improvement from prior exam. The cecum measures only 2.7 cm in diameter. Again the cecum is located in mid anterior abdomen suspicious for mobile cecum. There is no thickening of cecal wall. No evidence of cecal volvulus.  Abdominal wall scarring is noted in right lower quadrant. The uterus and adnexa are unremarkable. The urinary bladder is unremarkable. Fifth again noted descending colon and sigmoid colon diverticula. No evidence of acute diverticulitis.  IMPRESSION: 1. Significant improvement in segmental inflammatory changes of jejunum. Only residual mild segmental gaseous dilatation of jejunum. Minimal residual wall thickening with improvement from prior exam. Improvement in adjacent mesenteric stranding. 2. Residual mild gaseous distended cecum with improvement from prior exam. The cecum measures 2.7 cm in diameter. No evidence of cecal volvulus or thickening of cecal wall. 3. There is skin thickening and stranding of subcutaneous fat and subcutaneous edema in right flank wall. Cellulitis or subcutaneous edema cannot be excluded. Clinical correlation is necessary 4. Normal appearing transplanted kidney. Again noted atrophic native kidneys. No hydronephrosis. 5. No small bowel or colonic obstruction.   Electronically Signed   By: Lahoma Crocker M.D.   On: 11/07/2014 12:30   Ct Abdomen Pelvis W Contrast  11/04/2014   CLINICAL DATA:  Umbilical pain, abdominal pain, diarrhea  EXAM: CT ABDOMEN AND PELVIS WITH CONTRAST  TECHNIQUE: Multidetector CT imaging of the abdomen and pelvis was performed using the standard protocol following bolus administration of intravenous contrast.  CONTRAST:  175m OMNIPAQUE  IOHEXOL 300 MG/ML  SOLN  COMPARISON:  5/28/ 16  FINDINGS: There is small right pleural effusion with right lower lobe posterior atelectasis. Again noted perihepatic ascites increased in size from prior exam. Postcholecystectomy surgical clips are noted. There is contrast material in distal esophagus probable from gastroesophageal reflux.  There is no evidence of gastric outlet obstruction.  No evidence of small bowel obstruction. Again noted significant segmental thickening of jejunal wall in left lower quadrant best seen in axial image 54. Mild worsening from prior exam. Wall thickness measures at least 1.1 cm. Again findings consistent with segmental enteritis probable infectious. Atherosclerotic calcifications of abdominal aorta and bilateral iliac artery again noted. Atherosclerotic calcifications bilateral renal artery origin. Atherosclerotic calcifications of celiac trunk and SMA origin.  There is again noted mild stranding of mesenteric fat adjacent to inflamed segment of small bowel in left abdomen. Small mesenteric lymph nodes are noted at this level probable reactive.  The native kidneys are small in size atrophic. Stable lesion seen native kidneys. Again noted normal function an appearance of transplanted kidney in right lower quadrant.  There is significant gaseous distension of the cecum in left mid abdomen anteriorly. The cecum measures at least 6 cm in diameter suspicious for segmental sickle ileus. There is no evidence of small bowel obstruction. Contrast material is noted in right colon and transverse colon. 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 Small amount of ascites is noted in right paracolic gutter. Normal appendix is partially visualized in axial image 59. There is small amount of pelvic ascites in right lower pelvis. The uterus and adnexa are unremarkable.  No distal colonic obstruction. Few colonic  diverticula are noted in left colon. There is contrast material in left colon and sigmoid colon.  Delayed renal images shows normal excretion of transplanted kidney.  Sagittal images of the spine are unremarkable. The pancreas, spleen and adrenal glands are unremarkable. No focal hepatic mass. Status postcholecystectomy.  IMPRESSION: 1. There is slight worsening in segmental inflammatory changes in jejunum in left abdomen. Slight worsening of thickening of the jejunal wall up to 1.1 cm. Again findings consistent with segmental enteritis. This may be infectious in nature or due to lupus enteritis 2. Mild increased perihepatic ascites. 3. There is small right pleural effusion with right lower lobe posterior atelectasis. 4. There is significant gaseous distension of the cecum. Measures up to 6 cm. There is mild angulation and anterior position of the cecum . Findings suspicious for mobile cecum. There is no thickening of cecal wall to suggest cecal volvulus. There is no evidence of obstruction. Cecal ileus cannot be excluded. 5. Normal appendix. No colonic obstruction. Contrast material is noted throughout the colon. 6. Small amount of pelvic ascites. 7. Normal function and appearance of transplanted kidney. Again noted atrophic native kidneys.   Electronically Signed   By: Lahoma Crocker M.D.   On: 11/04/2014 17:02   Ct Abdomen Pelvis W Contrast  11/02/2014   CLINICAL DATA:  Acute abdominal pain  EXAM: CT ABDOMEN AND PELVIS WITH CONTRAST  TECHNIQUE: Multidetector CT imaging of the abdomen and pelvis was performed using the standard protocol following bolus administration of intravenous contrast.  CONTRAST:  151m OMNIPAQUE IOHEXOL 300 MG/ML  SOLN  COMPARISON:  06/28/2014  FINDINGS: BODY WALL: No contributory findings.  LOWER CHEST: Extensive coronary atherosclerosis for age  ABDOMEN/PELVIS:  Liver: No focal abnormality.  Biliary: Cholecystectomy with chronic intra and extrahepatic biliary duct enlargement  Pancreas:  Unremarkable.  Spleen: Single coarse calcification, likely granulomatous.  Adrenals: Unremarkable.  Kidneys and ureters: There is a transplant kidney in the right lower quadrant, already excreting contrast. Would always consult with transplant surgeon prior to administering IV contrast. Severe native renal atrophy with stable low-density renal lesions which are indeterminate due to soft tissue density, but size stable since 2013, 22 mm on the left and 16 mm on the right.  Bladder: Unremarkable.  Reproductive: No pathologic findings.  Bowel: There is severe wall thickening of a jejunum segment with low-density submucosal appearance consistent with edema. The mesentery is diffusely edematous. The associated vessels appear widely patent. There is no evidence of perforation or abscess. No bowel obstruction. Distal colonic diverticulosis. Normal appendix.  Retroperitoneum: No mass or adenopathy.  Peritoneum: Small ascites around the liver and in the pelvis, considered reactive.  Vascular: No acute abnormality.  OSSEOUS: No acute abnormalities.  IMPRESSION: 1. Severe short-segment jejunitis, likely infectious or lupus enteritis (given patient's history). 2. Bilateral native renal lesions are indeterminate due to density, but size stable from 2013. 3. Additional chronic findings are stable from prior noted above.   Electronically Signed   By: JMonte FantasiaM.D.   On: 11/02/2014 04:41     Medical Consultants:    Gastroenterology  Anti-Infectives:    Rocephin 5/28 -->  Flagyl 5/28 -->  Subjective:   Amelda C Lung says her pain is not better.  Had 3 loose stools today.  No nausea or vomiting.  Abdominal pain is still in the lower abdomen. Nursing staff tells me that they have not observed any diarrheal stools despite patient claiming to have them.  Objective:  Filed Vitals:   11/07/14 1034 11/07/14 1353 11/07/14 2126 11/08/14 0625  BP: 131/68 140/78 120/64 122/76  Pulse:  90 91 98  Temp:  98.1 F  (36.7 C) 98.2 F (36.8 C) 98.1 F (36.7 C)  TempSrc:  Oral Oral Oral  Resp:  _0 Height:      Weight:    84.6 kg (186 lb 8.2 oz)  SpO2:  98% 98% 98%    Intake/Output Summary (Last 24 hours) at 11/08/14 0753 Last data filed at 11/07/14 1533  Gross per 24 hour  Intake 1144.25 ml  Output      0 ml  Net 1144.25 ml    Exam: Gen:  NAD Cardiovascular:  RRR, No M/R/G Respiratory:  Lungs CTAB, diminished in the bases. Gastrointestinal:  Abdomen soft,  tender in the upper quadrants + BS Extremities:  No C/E/C   Data Reviewed:    Labs: Basic Metabolic Panel:  Recent Labs Lab 11/04/14 0446 11/05/14 0410 11/06/14 0420 11/07/14 0440 11/07/14 0507 11/08/14 0433  NA 138 139 142  --  139 137  K 3.5 2.9* 3.0*  --  3.3* 3.4*  CL 111 107 104  --  105 105  CO2 20* 22 23  --  18* 25  GLUCOSE 106* 78 69  --  112* 230*  BUN 20 9 5*  --  10 14  CREATININE 0.72 0.63 0.65  --  0.64 0.50  CALCIUM 8.2* 8.1* 8.4*  --  8.2* 8.5*  MG  --   --  1.3* 1.7  --   --    GFR Estimated Creatinine Clearance: 84.6 mL/min (by C-G formula based on Cr of 0.5). Liver Function Tests:  Recent Labs Lab 11/02/14 0023 11/02/14 0639 11/06/14 0420  AST _1 ALT _2 ALKPHOS 79 70 57  BILITOT 1.4* 1.0 1.3*  PROT 7.0 6.0* 5.6*  ALBUMIN 3.7 3.2* 2.8*    Recent Labs Lab 11/02/14 0023  LIPASE 17*   Coagulation profile  Recent Labs Lab 11/04/14 0446 11/05/14 0410 11/06/14 0420 11/07/14 0507 11/08/14 0433  INR 1.84* 2.48* 3.63* 3.12* 3.47*    CBC:  Recent Labs Lab 11/02/14 0023  11/02/14 1610  11/04/14 0446 11/05/14 0410 11/06/14 0420 11/07/14 0507 11/08/14 0433  WBC 12.2*  < > 13.4*  < > 12.3* 10.6* 10.4 5.9 7.1  NEUTROABS 10.1*  --  10.4*  --   --   --   --   --   --   HGB 13.7  < > 11.4*  < > 7.8* 7.9* 8.3* 8.7* 8.9*  HCT 41.4  < > 35.5*  < > 24.1* 24.8* 25.8* 27.1* 27.4*  MCV 89.2  < > 89.9  < > 92.3 91.9 92.5 91.9 89.0  PLT 184  < > 165  < > 158 163 198  267 312  < > = values in this interval not displayed.  Sepsis Labs:  Recent Labs Lab 11/02/14 0033  11/05/14 0410 11/06/14 0420 11/07/14 0507 11/08/14 0433  WBC  --   < > 10.6* 10.4 5.9 7.1  LATICACIDVEN 0.89  --   --   --   --   --   < > = values in this interval not displayed. Microbiology Recent Results (from the past 240 hour(s))  Clostridium Difficile by PCR     Status: None   Collection Time: 11/05/14  3:18 AM  Result Value Ref Range Status   C difficile by pcr  NEGATIVE NEGATIVE Final     Medications:   . atorvastatin  40 mg Oral q1800  . calcitRIOL  0.25 mcg Oral Daily  . cefTRIAXone (ROCEPHIN)  IV  2 g Intravenous Q0600  . darifenacin  7.5 mg Oral Daily  . diltiazem  240 mg Oral Daily  . donepezil  5 mg Oral QHS  . folic acid  1 mg Oral Daily  . losartan  100 mg Oral Daily  . methylPREDNISolone (SOLU-MEDROL) injection  60 mg Intravenous Q12H  . metronidazole  500 mg Intravenous 3 times per day  . mycophenolate  250 mg Oral BID  . pantoprazole (PROTONIX) IV  40 mg Intravenous Q12H  . potassium chloride  40 mEq Oral BID  . sertraline  100 mg Oral Daily  . sodium chloride  3 mL Intravenous Q12H  . tacrolimus  1 mg Oral BID  . Warfarin - Pharmacist Dosing Inpatient   Does not apply q1800   Continuous Infusions: . sodium chloride 75 mL/hr at 11/07/14 1533    Time spent: 25 minutes.   LOS: 6 days   [facility] Pager [phone]. If unable to reach me by pager, please call my cell phone at [phone].  *Please refer to [email or web address], password [email or web address] to get updated schedule on who will round on this patient, as hospitalists switch teams weekly. If 7PM-7AM, please contact night-coverage at [email or web address], password [email or web address] for any overnight needs.  11/08/2014, 7:53 AM

## 2014-11-09 DIAGNOSIS — R109 Unspecified abdominal pain: Secondary | ICD-10-CM

## 2014-11-09 DIAGNOSIS — T380X5A Adverse effect of glucocorticoids and synthetic analogues, initial encounter: Secondary | ICD-10-CM | POA: Diagnosis not present

## 2014-11-09 DIAGNOSIS — I639 Cerebral infarction, unspecified: Secondary | ICD-10-CM

## 2014-11-09 DIAGNOSIS — R739 Hyperglycemia, unspecified: Secondary | ICD-10-CM

## 2014-11-09 DIAGNOSIS — R1013 Epigastric pain: Secondary | ICD-10-CM

## 2014-11-09 HISTORY — DX: Adverse effect of glucocorticoids and synthetic analogues, initial encounter: T38.0X5A

## 2014-11-09 HISTORY — DX: Hyperglycemia, unspecified: R73.9

## 2014-11-09 LAB — BASIC METABOLIC PANEL
Anion gap: 4 — ABNORMAL LOW (ref 5–15)
BUN: 10 mg/dL (ref 6–20)
CO2: 27 mmol/L (ref 22–32)
Calcium: 8.6 mg/dL — ABNORMAL LOW (ref 8.9–10.3)
Chloride: 107 mmol/L (ref 101–111)
Creatinine, Ser: 0.5 mg/dL (ref 0.44–1.00)
GFR calc Af Amer: >60 mL/min (ref >60)
GFR calc non Af Amer: >60 mL/min (ref >60)
Glucose, Bld: 248 mg/dL — ABNORMAL HIGH (ref 65–99)
Potassium: 4 mmol/L (ref 3.5–5.1)
Sodium: 138 mmol/L (ref 135–145)

## 2014-11-09 LAB — GLUCOSE, CAPILLARY
Glucose-Capillary: 242 mg/dL — ABNORMAL HIGH (ref 65–99)
Glucose-Capillary: 186 mg/dL — ABNORMAL HIGH (ref 65–99)

## 2014-11-09 LAB — PROTIME-INR
INR: 3.53 — ABNORMAL HIGH (ref 0.00–1.49)
Prothrombin Time: 34.6 seconds — ABNORMAL HIGH (ref 11.6–15.2)

## 2014-11-09 MED ORDER — INSULIN ASPART 100 UNIT/ML ~~LOC~~ SOLN
0.0000 [IU] | Freq: Every day | SUBCUTANEOUS | Status: DC
  Administered 2014-11-10 – 2014-11-11 (×2): 2 [IU] via SUBCUTANEOUS

## 2014-11-09 MED ORDER — INSULIN ASPART 100 UNIT/ML ~~LOC~~ SOLN
0.0000 [IU] | Freq: Three times a day (TID) | SUBCUTANEOUS | Status: DC
  Administered 2014-11-09: 5 [IU] via SUBCUTANEOUS
  Administered 2014-11-10 (×2): 3 [IU] via SUBCUTANEOUS
  Administered 2014-11-10: 5 [IU] via SUBCUTANEOUS
  Administered 2014-11-11: 3 [IU] via SUBCUTANEOUS
  Administered 2014-11-11: 5 [IU] via SUBCUTANEOUS

## 2014-11-09 NOTE — Progress Notes (Signed)
Subjective  Still hurts to eat    Objective  Acute jejunitis, slowly improving on IV steroids. Tolerating full liquids in small  Amounts-wants to stay on them.   Vital signs in last 24 hours: Temp:  [97.7 F (36.5 C)-98.2 F (36.8 C)] 97.7 F (36.5 C) (06/04 0530) Pulse Rate:  [75-87] 75 (06/04 0530) Resp:  [16-18] 18 (06/04 0530) BP: (141-166)/(78-94) 161/94 mmHg (06/04 0530) SpO2:  [98 %] 98 % (06/04 0530) Weight:  [186 lb 11.7 oz (84.7 kg)] 186 lb 11.7 oz (84.7 kg) (06/04 0530) Last BM Date: 11/08/14 General:    AA female in NAD Heart:  Regular rate and rhythm; no murmurs Lungs: Respirations even and unlabored, lungs CTA bilaterally Abdomen:  Soft, tender , nondistended. Normal bowel sounds. Extremities:  Without edema. Neurologic:  Alert and oriented,  grossly normal neurologically., aphasia Psych:  Cooperative. Normal mood and affect.  Intake/Output from previous day: 06/03 0701 - 06/04 0700 In: 2755 [P.O.:780; I.V.:1725; IV Piggyback:250] Out: [id number] [Urine:1450; Stool:5] Intake/Output this shift: Total I/O In: 292.5 [P.O.:60; I.V.:132.5; IV Piggyback:100] Out: 400 [Urine:400]  Lab Results:  Recent Labs  11/07/14 0507 11/08/14 0433  WBC 5.9 7.1  HGB 8.7* 8.9*  HCT 27.1* 27.4*  PLT 267 312   BMET  Recent Labs  11/07/14 0507 11/08/14 0433 11/09/14 0449  NA 139 137 138  K 3.3* 3.4* 4.0  CL 105 105 107  CO2 18* 25 27  GLUCOSE 112* 230* 248*  BUN 10 14 10   CREATININE 0.64 0.50 0.50  CALCIUM 8.2* 8.5* 8.6*   LFT No results for input(s): PROT, ALBUMIN, AST, ALT, ALKPHOS, BILITOT, BILIDIR, IBILI in the last 72 hours. PT/INR  Recent Labs  11/08/14 0433 11/09/14 0449  LABPROT 34.2* 34.6*  INR 3.47* 3.53*    Studies/Results: Ct Abdomen Pelvis W Contrast  11/07/2014   CLINICAL DATA:  Abdominal pain, inflammation of jejunal loops  EXAM: CT ABDOMEN AND PELVIS WITH CONTRAST  TECHNIQUE: Multidetector CT imaging of the abdomen and pelvis was performed  using the standard protocol following bolus administration of intravenous contrast.  CONTRAST:  157mL OMNIPAQUE IOHEXOL 300 MG/ML  SOLN  COMPARISON:  11/04/2014  FINDINGS: Lung bases shows small right pleural effusion with right lower lobe posteriorly atelectasis.  There is skin thickening and stranding of subcutaneous fat with subcutaneous edema in right flank wall. Clinical correlation is necessary to exclude cellulitis.  There is small perihepatic ascites. The patient is status postcholecystectomy.  Atherosclerotic calcifications of abdominal aorta and iliac arteries again noted. Again noted atrophic native kidneys. Normal appearing transplanted kidney in right lower quadrant.  There is significant improvement in the segmental inflammatory changes in jejunum. Only residual mild gaseous distended segmental jejunal loop at the site of previous inflammation. Minimal residual thickened jejunal wall with improvement from prior exam. Improvement in surrounding mesenteric inflammatory changes.  The pancreas, spleen and adrenal glands are unremarkable.  There is residual mild distension of the cecum with gas with improvement from prior exam. The cecum measures only 2.7 cm in diameter. Again the cecum is located in mid anterior abdomen suspicious for mobile cecum. There is no thickening of cecal wall. No evidence of cecal volvulus.  Abdominal wall scarring is noted in right lower quadrant. The uterus and adnexa are unremarkable. The urinary bladder is unremarkable. Fifth again noted descending colon and sigmoid colon diverticula. No evidence of acute diverticulitis.  IMPRESSION: 1. Significant improvement in segmental inflammatory changes of jejunum. Only residual mild segmental gaseous dilatation of jejunum.  Minimal residual wall thickening with improvement from prior exam. Improvement in adjacent mesenteric stranding. 2. Residual mild gaseous distended cecum with improvement from prior exam. The cecum measures 2.7 cm in  diameter. No evidence of cecal volvulus or thickening of cecal wall. 3. There is skin thickening and stranding of subcutaneous fat and subcutaneous edema in right flank wall. Cellulitis or subcutaneous edema cannot be excluded. Clinical correlation is necessary 4. Normal appearing transplanted kidney. Again noted atrophic native kidneys. No hydronephrosis. 5. No small bowel or colonic obstruction.   Electronically Signed   By: Lahoma Crocker M.D.   On: 11/07/2014 12:30       Assessment / Plan:   I have spoken to pt's sister Bocha  we may be able to switch to oral steroids ( 40 mg) in next 24-48 hours OK to stop Rocephin Follow INR ,off Flagyl Pt to remain on full liquids  Principal Problem:   Jejunitis Active Problems:   Essential hypertension   Cerebral artery occlusion with cerebral infarction   LUPUS   KIDNEY TRANSPLANTATION, HX OF   DVT (deep venous thrombosis)   Depression   History of stroke with residual effects   Supratherapeutic INR   Hypomagnesemia   Abdominal pain, acute     LOS: 7 days   Delfin Edis  11/09/2014, 10:21 AM

## 2014-11-09 NOTE — Progress Notes (Signed)
Physical Therapy Treatment Patient Details Name: Connie Ruiz MRN: [id number] DOB: 1961-04-22 Today's Date: 11/09/2014    History of Present Illness Patient is a 54 y/o female admitted with abdominal pain, positive for jejunitis.  PMH positive for antiphospholipid syndrome, renal transplant, CVA with R residual deficits, DVT w/ PE, DM, lupus.    PT Comments    Pt up in recliner on arrival and agreeable to ambulate today.  Pt reports ambulation limited by fatigue and weakness.  Pt reports at baseline she is usually able to get up and move around home without difficulty, also states she only has an aide until 2pm (would then be home alone).  Due to current mobility, limited endurance, pain and currently requiring assist, recommend SNF at this time.    Follow Up Recommendations  SNF     Equipment Recommendations  None recommended by PT    Recommendations for Other Services       Precautions / Restrictions Precautions Precautions: Fall Precaution Comments: history of falls at home    Mobility  Bed Mobility Overal bed mobility: Needs Assistance Bed Mobility: Sit to Supine       Sit to supine: Mod assist   General bed mobility comments: assist for LEs onto bed  Transfers Overall transfer level: Needs assistance Equipment used: Rolling walker (2 wheeled) Transfers: Sit to/from Stand Sit to Stand: Min assist         General transfer comment: verbal cues for technique, assist to rise  Ambulation/Gait Ambulation/Gait assistance: Min assist Ambulation Distance (Feet): 40 Feet Assistive device: Rolling walker (2 wheeled) Gait Pattern/deviations: Step-through pattern;Decreased stride length;Trunk flexed Gait velocity: decr   General Gait Details: pt reports feeling very fatigued, required standing rest break leaning against wall after 20 feet   Stairs            Wheelchair Mobility    Modified Rankin (Stroke Patients Only)       Balance Overall balance  assessment: History of Falls                                  Cognition Arousal/Alertness: Awake/alert Behavior During Therapy: Flat affect Overall Cognitive Status: No family/caregiver present to determine baseline cognitive functioning                      Exercises      General Comments        Pertinent Vitals/Pain Pain Assessment: Faces Faces Pain Scale: Hurts whole lot Pain Location: abdomen Pain Intervention(s): Patient requesting pain meds-RN notified;Repositioned;Monitored during session    Home Living                      Prior Function            PT Goals (current goals can now be found in the care plan section) Acute Rehab PT Goals PT Goal Formulation: With patient Time For Goal Achievement: 11/22/14 Progress towards PT goals: Progressing toward goals    Frequency  Min 3X/week    PT Plan Current plan remains appropriate    Co-evaluation             End of Session   Activity Tolerance: Patient limited by pain;Patient limited by fatigue Patient left: in bed;with call bell/phone within reach;with bed alarm set     Time: [id number] PT Time Calculation (min) (ACUTE ONLY): 15 min  Charges:  $Gait Training:  8-22 mins                    G Codes:      Wilda Wetherell,KATHrine E 2014/11/24, 12:03 PM Carmelia Bake, PT, DPT 2014/11/24 Pager: [phone]

## 2014-11-09 NOTE — Progress Notes (Signed)
Progress Note   Connie Ruiz ULA:[id number] DOB: Apr 14, 1961 DOA: 11/02/2014 PCP: Renato Shin, MD   Brief Narrative:   Connie Ruiz is an 54 y.o. female with complicated medical history including HTN, DM, lupus and antiphospholipid syndrome, s/p renal transplant requiring long term immunosuppressant, CVA in 2011, PE in 07/2010 and requiring long term AC with Coumadin, last known colonoscopy in [place] in 2002 which was reportedly unremarkable, admitted 11/02/14 with jejunitis being managed with Flagyl/Rocephin.  Hospital course has been complicated by persistent abd pain despite IV ABX regimen. Repeat CT abd and pelvis on 11/04/14 showed potentially worsening diverticulitis and GI team subsequently consulted for further assistance.   Assessment/Plan:   Principal Problem:  Acute abd pain secondary to jejunitis - Differential of etiology is infectious versus vasculitic secondary to lupus. ESR 65, ANA + 1:1280. - Repeat CT scan done 11/07/14 which showed improvement in bowel wall temporally related to steroid initiation. - D/C empiric Flagyl/Rocephin, continue Solu-Medrol. C. difficile PCR negative 2. - Diet advanced 11/07/14.  Tolerating FL despite reports that she does not feel any better.  Active Problems:   Steroid induced hyperglycemia - Start SSI, moderate scale.   Essential hypertension -  Continue Cardizem and Cozaar. Blood pressure stable.   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  - Pt is followed by Dr Annabelle Harman at [facility] for rheumatology.  - Prior workup showed negative dsDNA, SSA and SSB, negative Smith antibody, negative hepatitis profile. - Patient was ANA + 1:1280 with an abnormal lupus inhibitor. - Solu-Medrol started 11/06/14 due to concerns for vasculitis induced segmental jejunitis.   DVT (deep venous thrombosis) - Coumadin currently on hold, INR remains  mildly supratherapeutic.   Anemia of chronic disease, lupus and IDA - FOBT requested. No evidence of active significant GI bleeding. - The patient is [name] and declines blood products for support. - Iron, B-12, folate WNL.   Depression - Continue Zoloft.    Hypokalemia / hypomagnesemia - Resolved with supplementation.   Supratherapeutic INR - Was given 2 doses of vit K 5 mg IV on admission. - Coumadin currently on hold. Resume when INR drifts back down below 3.   Obesity  - Body mass index is 31.2 kg/(m^2).    DVT prophylaxis  - On chronic anticoagulation with Coumadin. INR therapeutic.  Code Status: Full.  Family Communication: Updated her brother by telephone 11/06/14. No family at the bedside today.  Dr. Olevia Perches spoke with her sister Tobin Chad today. Disposition Plan: Home when pain better controlled and responding to treatment, likely another 1-2 days.    IV Access:    Peripheral IV   Procedures and diagnostic studies:   Ct Abdomen Pelvis W Contrast  11/07/2014   CLINICAL DATA:  Abdominal pain, inflammation of jejunal loops  EXAM: CT ABDOMEN AND PELVIS WITH CONTRAST  TECHNIQUE: Multidetector CT imaging of the abdomen and pelvis was performed using the standard protocol following bolus administration of intravenous contrast.  CONTRAST:  112m OMNIPAQUE IOHEXOL 300 MG/ML  SOLN  COMPARISON:  11/04/2014  FINDINGS: Lung bases shows small right pleural effusion with right lower lobe posteriorly atelectasis.  There is skin thickening and stranding of subcutaneous fat with subcutaneous edema in right flank wall. Clinical correlation is necessary to exclude cellulitis.  There is small perihepatic ascites. The patient is status postcholecystectomy.  Atherosclerotic calcifications of abdominal aorta and iliac arteries again noted. Again noted atrophic  native kidneys. Normal appearing transplanted kidney in right lower quadrant.  There is significant improvement in the  segmental inflammatory changes in jejunum. Only residual mild gaseous distended segmental jejunal loop at the site of previous inflammation. Minimal residual thickened jejunal wall with improvement from prior exam. Improvement in surrounding mesenteric inflammatory changes.  The pancreas, spleen and adrenal glands are unremarkable.  There is residual mild distension of the cecum with gas with improvement from prior exam. The cecum measures only 2.7 cm in diameter. Again the cecum is located in mid anterior abdomen suspicious for mobile cecum. There is no thickening of cecal wall. No evidence of cecal volvulus.  Abdominal wall scarring is noted in right lower quadrant. The uterus and adnexa are unremarkable. The urinary bladder is unremarkable. Fifth again noted descending colon and sigmoid colon diverticula. No evidence of acute diverticulitis.  IMPRESSION: 1. Significant improvement in segmental inflammatory changes of jejunum. Only residual mild segmental gaseous dilatation of jejunum. Minimal residual wall thickening with improvement from prior exam. Improvement in adjacent mesenteric stranding. 2. Residual mild gaseous distended cecum with improvement from prior exam. The cecum measures 2.7 cm in diameter. No evidence of cecal volvulus or thickening of cecal wall. 3. There is skin thickening and stranding of subcutaneous fat and subcutaneous edema in right flank wall. Cellulitis or subcutaneous edema cannot be excluded. Clinical correlation is necessary 4. Normal appearing transplanted kidney. Again noted atrophic native kidneys. No hydronephrosis. 5. No small bowel or colonic obstruction.   Electronically Signed   By: Lahoma Crocker M.D.   On: 11/07/2014 12:30   Ct Abdomen Pelvis W Contrast  11/04/2014   CLINICAL DATA:  Umbilical pain, abdominal pain, diarrhea  EXAM: CT ABDOMEN AND PELVIS WITH CONTRAST  TECHNIQUE: Multidetector CT imaging of the abdomen and pelvis was performed using the standard protocol  following bolus administration of intravenous contrast.  CONTRAST:  153mL OMNIPAQUE IOHEXOL 300 MG/ML  SOLN  COMPARISON:  5/28/ 16  FINDINGS: There is small right pleural effusion with right lower lobe posterior atelectasis. Again noted perihepatic ascites increased in size from prior exam. Postcholecystectomy surgical clips are noted. There is contrast material in distal esophagus probable from gastroesophageal reflux.  There is no evidence of gastric outlet obstruction.  No evidence of small bowel obstruction. Again noted significant segmental thickening of jejunal wall in left lower quadrant best seen in axial image 54. Mild worsening from prior exam. Wall thickness measures at least 1.1 cm. Again findings consistent with segmental enteritis probable infectious. Atherosclerotic calcifications of abdominal aorta and bilateral iliac artery again noted. Atherosclerotic calcifications bilateral renal artery origin. Atherosclerotic calcifications of celiac trunk and SMA origin.  There is again noted mild stranding of mesenteric fat adjacent to inflamed segment of small bowel in left abdomen. Small mesenteric lymph nodes are noted at this level probable reactive.  The native kidneys are small in size atrophic. Stable lesion seen native kidneys. Again noted normal function an appearance of transplanted kidney in right lower quadrant.  There is significant gaseous distension of the cecum in left mid abdomen anteriorly. The cecum measures at least 6 cm in diameter suspicious for segmental sickle ileus. There is no evidence of small bowel obstruction. Contrast material is noted in right colon and transverse colon. 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 Small amount of ascites is noted in right paracolic gutter. Normal appendix is partially visualized in axial image 59. There is small  amount of pelvic ascites in right lower pelvis.  The uterus and adnexa are unremarkable. No distal colonic obstruction. Few colonic diverticula are noted in left colon. There is contrast material in left colon and sigmoid colon.  Delayed renal images shows normal excretion of transplanted kidney.  Sagittal images of the spine are unremarkable. The pancreas, spleen and adrenal glands are unremarkable. No focal hepatic mass. Status postcholecystectomy.  IMPRESSION: 1. There is slight worsening in segmental inflammatory changes in jejunum in left abdomen. Slight worsening of thickening of the jejunal wall up to 1.1 cm. Again findings consistent with segmental enteritis. This may be infectious in nature or due to lupus enteritis 2. Mild increased perihepatic ascites. 3. There is small right pleural effusion with right lower lobe posterior atelectasis. 4. There is significant gaseous distension of the cecum. Measures up to 6 cm. There is mild angulation and anterior position of the cecum . Findings suspicious for mobile cecum. There is no thickening of cecal wall to suggest cecal volvulus. There is no evidence of obstruction. Cecal ileus cannot be excluded. 5. Normal appendix. No colonic obstruction. Contrast material is noted throughout the colon. 6. Small amount of pelvic ascites. 7. Normal function and appearance of transplanted kidney. Again noted atrophic native kidneys.   Electronically Signed   By: Lahoma Crocker M.D.   On: 11/04/2014 17:02   Ct Abdomen Pelvis W Contrast  11/02/2014   CLINICAL DATA:  Acute abdominal pain  EXAM: CT ABDOMEN AND PELVIS WITH CONTRAST  TECHNIQUE: Multidetector CT imaging of the abdomen and pelvis was performed using the standard protocol following bolus administration of intravenous contrast.  CONTRAST:  145m OMNIPAQUE IOHEXOL 300 MG/ML  SOLN  COMPARISON:  06/28/2014  FINDINGS: BODY WALL: No contributory findings.  LOWER CHEST: Extensive coronary atherosclerosis for age  ABDOMEN/PELVIS:  Liver: No focal abnormality.  Biliary:  Cholecystectomy with chronic intra and extrahepatic biliary duct enlargement  Pancreas: Unremarkable.  Spleen: Single coarse calcification, likely granulomatous.  Adrenals: Unremarkable.  Kidneys and ureters: There is a transplant kidney in the right lower quadrant, already excreting contrast. Would always consult with transplant surgeon prior to administering IV contrast. Severe native renal atrophy with stable low-density renal lesions which are indeterminate due to soft tissue density, but size stable since 2013, 22 mm on the left and 16 mm on the right.  Bladder: Unremarkable.  Reproductive: No pathologic findings.  Bowel: There is severe wall thickening of a jejunum segment with low-density submucosal appearance consistent with edema. The mesentery is diffusely edematous. The associated vessels appear widely patent. There is no evidence of perforation or abscess. No bowel obstruction. Distal colonic diverticulosis. Normal appendix.  Retroperitoneum: No mass or adenopathy.  Peritoneum: Small ascites around the liver and in the pelvis, considered reactive.  Vascular: No acute abnormality.  OSSEOUS: No acute abnormalities.  IMPRESSION: 1. Severe short-segment jejunitis, likely infectious or lupus enteritis (given patient's history). 2. Bilateral native renal lesions are indeterminate due to density, but size stable from 2013. 3. Additional chronic findings are stable from prior noted above.   Electronically Signed   By: JMonte FantasiaM.D.   On: 11/02/2014 04:41     Medical Consultants:    Gastroenterology  Anti-Infectives:    Rocephin 5/28 -->11/09/14  Flagyl 5/28 -->11/09/14  Subjective:   MMadelin Headingssays her pain is better, but she still has pain "off and on".  Still with some nausea and loose stools.  Appetite fair, still on FL diet.  Objective:  Filed Vitals:   11/08/14 0625 11/08/14 1415 11/08/14 2050 11/09/14 0530  BP: 122/76 141/78 166/93 161/94  Pulse: 98 87 75 75  Temp: 98.1  F (36.7 C) 98.2 F (36.8 C) 98 F (36.7 C) 97.7 F (36.5 C)  TempSrc: Oral Oral Oral Oral  Resp: _0 Height:      Weight: 84.6 kg (186 lb 8.2 oz)   84.7 kg (186 lb 11.7 oz)  SpO2: 98% 98% 98% 98%    Intake/Output Summary (Last 24 hours) at 11/09/14 0754 Last data filed at 11/09/14 0711  Gross per 24 hour  Intake   2755 ml  Output   1655 ml  Net   1100 ml    Exam: Gen:  NAD Cardiovascular:  RRR, No M/R/G Respiratory:  Lungs CTAB, diminished in the bases. Gastrointestinal:  Abdomen soft,  tender in the upper quadrants + BS Extremities:  No C/E/C   Data Reviewed:    Labs: Basic Metabolic Panel:  Recent Labs Lab 11/05/14 0410 11/06/14 0420 11/07/14 0440 11/07/14 0507 11/08/14 0433 11/09/14 0449  NA 139 142  --  139 137 138  K 2.9* 3.0*  --  3.3* 3.4* 4.0  CL 107 104  --  105 105 107  CO2 22 23  --  18* 25 27  GLUCOSE 78 69  --  112* 230* 248*  BUN 9 5*  --  _1 CREATININE 0.63 0.65  --  0.64 0.50 0.50  CALCIUM 8.1* 8.4*  --  8.2* 8.5* 8.6*  MG  --  1.3* 1.7  --   --   --    GFR Estimated Creatinine Clearance: 84.6 mL/min (by C-G formula based on Cr of 0.5). Liver Function Tests:  Recent Labs Lab 11/06/14 0420  AST 16  ALT 14  ALKPHOS 57  BILITOT 1.3*  PROT 5.6*  ALBUMIN 2.8*   No results for input(s): LIPASE, AMYLASE in the last 168 hours. Coagulation profile  Recent Labs Lab 11/05/14 0410 11/06/14 0420 11/07/14 0507 11/08/14 0433 11/09/14 0449  INR 2.48* 3.63* 3.12* 3.47* 3.53*    CBC:  Recent Labs Lab 11/04/14 0446 11/05/14 0410 11/06/14 0420 11/07/14 0507 11/08/14 0433  WBC 12.3* 10.6* 10.4 5.9 7.1  HGB 7.8* 7.9* 8.3* 8.7* 8.9*  HCT 24.1* 24.8* 25.8* 27.1* 27.4*  MCV 92.3 91.9 92.5 91.9 89.0  PLT 158 163 198 267 312    Sepsis Labs:  Recent Labs Lab 11/05/14 0410 11/06/14 0420 11/07/14 0507 11/08/14 0433  WBC 10.6* 10.4 5.9 7.1   Microbiology Recent Results (from the past 240 hour(s))  Clostridium  Difficile by PCR     Status: None   Collection Time: 11/05/14  3:18 AM  Result Value Ref Range Status   C difficile by pcr NEGATIVE NEGATIVE Final  Clostridium Difficile by PCR     Status: None   Collection Time: 11/08/14  8:44 PM  Result Value Ref Range Status   C difficile by pcr NEGATIVE NEGATIVE Final     Medications:   . atorvastatin  40 mg Oral q1800  . calcitRIOL  0.25 mcg Oral Daily  . cefTRIAXone (ROCEPHIN)  IV  2 g Intravenous Q0600  . darifenacin  7.5 mg Oral Daily  . diltiazem  240 mg Oral Daily  . donepezil  5 mg Oral QHS  . folic acid  1 mg Oral Daily  . losartan  100 mg Oral Daily  . methylPREDNISolone (SOLU-MEDROL) injection  60 mg Intravenous Q12H  .  metronidazole  500 mg Intravenous 3 times per day  . mycophenolate  250 mg Oral BID  . pantoprazole (PROTONIX) IV  40 mg Intravenous Q12H  . potassium chloride  40 mEq Oral BID  . sertraline  100 mg Oral Daily  . sodium chloride  3 mL Intravenous Q12H  . tacrolimus  1 mg Oral BID  . Warfarin - Pharmacist Dosing Inpatient   Does not apply q1800   Continuous Infusions: . sodium chloride 75 mL/hr at 11/09/14 0028    Time spent: 25 minutes.   LOS: 7 days   [facility] Pager [phone]. If unable to reach me by pager, please call my cell phone at [phone].  *Please refer to [email or web address], password [email or web address] to get updated schedule on who will round on this patient, as hospitalists switch teams weekly. If 7PM-7AM, please contact night-coverage at [email or web address], password [email or web address] for any overnight needs.  11/09/2014, 7:54 AM

## 2014-11-09 NOTE — Progress Notes (Signed)
ANTICOAGULATION CONSULT NOTE - Follow Up Consult  Pharmacy Consult for warfarin Indication:hx PE, DVT and stroke  Allergies  Allergen Reactions  . Oxycodone-Acetaminophen Shortness Of Breath and Nausea Only  . Propoxyphene N-Acetaminophen Shortness Of Breath and Nausea Only  . Sulfonamide Derivatives Shortness Of Breath and Nausea Only  . Codeine Nausea Only  . Latex Rash  . Metoprolol Rash   Patient Measurements: Height: 5\' 4"  (162.6 cm) Weight: 186 lb 11.7 oz (84.7 kg) IBW/kg (Calculated) : 54.7  Vital Signs: Temp: 97.7 F (36.5 C) (06/04 0530) Temp Source: Oral (06/04 0530) BP: 161/94 mmHg (06/04 0530) Pulse Rate: 75 (06/04 0530)  Labs:  Recent Labs  11/07/14 0507 11/08/14 0433 11/09/14 0449  HGB 8.7* 8.9*  --   HCT 27.1* 27.4*  --   PLT 267 312  --   LABPROT 31.5* 34.2* 34.6*  INR 3.12* 3.47* 3.53*  CREATININE 0.64 0.50 0.50   Estimated Creatinine Clearance: 84.6 mL/min (by C-G formula based on Cr of 0.5).  Medications:  Scheduled:  . atorvastatin  40 mg Oral q1800  . calcitRIOL  0.25 mcg Oral Daily  . cefTRIAXone (ROCEPHIN)  IV  2 g Intravenous Q0600  . darifenacin  7.5 mg Oral Daily  . diltiazem  240 mg Oral Daily  . donepezil  5 mg Oral QHS  . folic acid  1 mg Oral Daily  . losartan  100 mg Oral Daily  . methylPREDNISolone (SOLU-MEDROL) injection  60 mg Intravenous Q12H  . metronidazole  500 mg Intravenous 3 times per day  . mycophenolate  250 mg Oral BID  . pantoprazole (PROTONIX) IV  40 mg Intravenous Q12H  . potassium chloride  40 mEq Oral BID  . sertraline  100 mg Oral Daily  . sodium chloride  3 mL Intravenous Q12H  . tacrolimus  1 mg Oral BID  . Warfarin - Pharmacist Dosing Inpatient   Does not apply q1800   Assessment:  31 yoF admitted with c/o diffuse abd pain, N/V. 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 Jejunitis on CT, treating with Cipro/Flagyl. Other hx: Lupus, Renal transplant on immunomodulators.  INR > 10 on admission, treated with IV/PO Vit K + FFP.  Warfarin resumed 5/29 with INR 1.58.   Today 11/09/2014  INR now up to 3.53 (s/p vit K 5mg  IV & PO on 5/28, 5mg  PO on 6/1)  Hgb low but stable, plt ok  Flagyl (started 5/28) can increase INR, poor po intake  No bleeding documented  Goal of Therapy:  INR 2-3  Per Dr. Rockne Menghini, ok to resume warfarin when INR <3   Plan:   Continue to hold warfarin today  Daily INR  Monitor for signs of bleeding or thrombosis  Thank you for the consult,   [name] PharmD Pager [phone] 11/09/2014, 10:12 AM

## 2014-11-10 DIAGNOSIS — R1084 Generalized abdominal pain: Secondary | ICD-10-CM

## 2014-11-10 DIAGNOSIS — I10 Essential (primary) hypertension: Secondary | ICD-10-CM

## 2014-11-10 LAB — GLUCOSE, CAPILLARY
Glucose-Capillary: 186 mg/dL — ABNORMAL HIGH (ref 65–99)
Glucose-Capillary: 215 mg/dL — ABNORMAL HIGH (ref 65–99)
Glucose-Capillary: 237 mg/dL — ABNORMAL HIGH (ref 65–99)

## 2014-11-10 LAB — PROTIME-INR
INR: 4.17 — ABNORMAL HIGH (ref 0.00–1.49)
Prothrombin Time: 39.2 seconds — ABNORMAL HIGH (ref 11.6–15.2)

## 2014-11-10 LAB — OCCULT BLOOD X 1 CARD TO LAB, STOOL: Fecal Occult Bld: NEGATIVE

## 2014-11-10 MED ORDER — VITAMIN K1 10 MG/ML IJ SOLN
5.0000 mg | Freq: Once | INTRAVENOUS | Status: AC
  Administered 2014-11-10: 5 mg via INTRAVENOUS
  Filled 2014-11-10: qty 0.5

## 2014-11-10 NOTE — Progress Notes (Signed)
Patient voiding frequently, about every 30-45 min. Pt voiding 200-250 each time. Patient bladder scan showing 117 cc's. Pt stated she feels pressure in her bladder. Lower abdomen tender to touch. Will continue to monitor closely.

## 2014-11-10 NOTE — Progress Notes (Signed)
   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 Having loose stools, nausea but no vomiting, Last CT scan showed marked improvement in the appearance of the involved segment of jejunum, estimated to be about mid jejunum Vital signs in last 24 hours: Temp:  [97.6 F (36.4 C)-98.4 F (36.9 C)] 98.4 F (36.9 C) (06/05 0411) Pulse Rate:  [68-77] 68 (06/05 0411) Resp:  [18] 18 (06/05 0411) BP: (144-168)/(72-82) 168/72 mmHg (06/05 0411) SpO2:  [98 %-100 %] 100 % (06/05 0411) Weight:  [193 lb 5.5 oz (87.7 kg)] 193 lb 5.5 oz (87.7 kg) (06/05 0411) Last BM Date: 11/09/14 General:    AA female in NAD Heart:  Regular rate and rhythm; no murmurs Lungs: Respirations even and unlabored, lungs CTA bilaterally Abdomen:  Soft, diffusely tender but nondistended. Normal bowel sounds., no trebound Extremities:  Without edema. Neurologic:  Alert and oriented,  Mild expressive  aphasia Psych:  Cooperative. Normal mood and affect.  Intake/Output from previous day: 06/04 0701 - 06/05 0700 In: 1672.5 [P.O.:1140; I.V.:432.5; IV Piggyback:100] Out: 1550 [Urine:1550] Intake/Output this shift: Total I/O In: 480 [P.O.:480] Out: 400 [Urine:400]  Lab Results:  Recent Labs  11/08/14 0433  WBC 7.1  HGB 8.9*  HCT 27.4*  PLT 312   BMET  Recent Labs  11/08/14 0433 11/09/14 0449  NA 137 138  K 3.4* 4.0  CL 105 107  CO2 25 27  GLUCOSE 230* 248*  BUN 14 10  CREATININE 0.50 0.50  CALCIUM 8.5* 8.6*   LFT No results for input(s): PROT, ALBUMIN, AST, ALT, ALKPHOS, BILITOT, BILIDIR, IBILI in the last 72 hours. PT/INR  Recent Labs  11/09/14 0449 11/10/14 0500  LABPROT 34.6* 39.2*  INR 3.53* 4.17*    Studies/Results: No results found.     Assessment / Plan:   Ongoing acute abdominal process,  radiograpically improved but subjectively pt is still having pain,  Will continue IV steroids,  Recheck sed.rate  would PTcorrect with Vit K Schedule enteroscopy for tomorrow- Dr Fuller Plan to see if any inflammatory changes in proximal small bowl Stay on full liquis- pt ensouraged to take pain medication before  lunch and supper  Principal Problem:   Jejunitis Active Problems:   Essential hypertension   Cerebral artery occlusion with cerebral infarction   LUPUS   KIDNEY TRANSPLANTATION, HX OF   DVT (deep venous thrombosis)   Depression   History of stroke with residual effects   Supratherapeutic INR   Hypomagnesemia   Abdominal pain, acute   Abdominal pain   Steroid-induced hyperglycemia     LOS: 8 days   Delfin Edis  11/10/2014, 10:06 AM

## 2014-11-10 NOTE — Progress Notes (Signed)
Progress Note   Connie Ruiz VOU:[id number] DOB: 03/02/1961 DOA: 11/02/2014 PCP: Renato Shin, MD   Brief Narrative:   Connie Ruiz is an 54 y.o. female with complicated medical history including HTN, DM, lupus and antiphospholipid syndrome, s/p renal transplant requiring long term immunosuppressant, CVA in 2011, PE in 07/2010 and requiring long term AC with Coumadin, last known colonoscopy in [place] in 2002 which was reportedly unremarkable, admitted 11/02/14 with jejunitis being managed with Flagyl/Rocephin.  Hospital course has been complicated by persistent abd pain despite IV ABX regimen. Repeat CT abd and pelvis on 11/04/14 showed potentially worsening diverticulitis and GI team subsequently consulted for further assistance.   Assessment/Plan:   Principal Problem:  Acute abd pain secondary to jejunitis - Differential of etiology is infectious versus vasculitic secondary to lupus. ESR 65, ANA + 1:1280. - Repeat CT scan done 11/07/14 which showed improvement in bowel wall temporally related to steroid initiation. - Status post 7 days of therapy with Flagyl/Rocephin, continue Solu-Medrol. C. difficile PCR negative 2. - Diet advanced to [facility] 11/07/14, tolerating. - GI to do endoscopy to evaluate proximal small bowel 11/11/14 given failure to improve.  Active Problems:   Steroid induced hyperglycemia - Currently being managed with SSI, moderate scale. CBGs [id number].   Essential hypertension -  Continue Cardizem and Cozaar. Blood pressure stable.   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  - Pt is followed by Dr Annabelle Harman at [facility] for rheumatology.  - Prior workup showed negative dsDNA, SSA and SSB, negative Smith antibody, negative hepatitis profile. - Patient was ANA + 1:1280 with an abnormal lupus inhibitor. - Solu-Medrol started 11/06/14 due to concerns for vasculitis induced  segmental jejunitis.   DVT (deep venous thrombosis) - Coumadin currently on hold, INR remains mildly supratherapeutic.   Anemia of chronic disease, lupus and IDA - FOBT requested. No evidence of active significant GI bleeding. - The patient is [name] and declines blood products for support. - Iron, B-12, folate WNL.   Depression - Continue Zoloft.    Hypokalemia / hypomagnesemia - Resolved with supplementation.   Supratherapeutic INR - Was given 2 doses of vit K 5 mg IV on admission.  Repeat Vitamin K today. - Coumadin currently on hold. Resume when INR drifts back down below 3.   Obesity  - Body mass index is 31.2 kg/(m^2).    DVT prophylaxis  - On chronic anticoagulation with Coumadin. INR therapeutic.  Code Status: Full.  Family Communication: Updated her brother by telephone 11/06/14. No family at the bedside today.  Dr. Olevia Perches spoke with her sister Tobin Chad today. Disposition Plan: Home when pain better controlled and responding to treatment, likely another 1-2 days.    IV Access:    Peripheral IV   Procedures and diagnostic studies:   Ct Abdomen Pelvis W Contrast  11/07/2014   CLINICAL DATA:  Abdominal pain, inflammation of jejunal loops  EXAM: CT ABDOMEN AND PELVIS WITH CONTRAST  TECHNIQUE: Multidetector CT imaging of the abdomen and pelvis was performed using the standard protocol following bolus administration of intravenous contrast.  CONTRAST:  156m OMNIPAQUE IOHEXOL 300 MG/ML  SOLN  COMPARISON:  11/04/2014  FINDINGS: Lung bases shows small right pleural effusion with right lower lobe posteriorly atelectasis.  There is skin thickening and stranding of subcutaneous fat with subcutaneous edema in right flank wall. Clinical correlation is necessary to exclude cellulitis.  There is small  perihepatic ascites. The patient is status postcholecystectomy.  Atherosclerotic calcifications of abdominal aorta and iliac arteries again noted. Again noted  atrophic native kidneys. Normal appearing transplanted kidney in right lower quadrant.  There is significant improvement in the segmental inflammatory changes in jejunum. Only residual mild gaseous distended segmental jejunal loop at the site of previous inflammation. Minimal residual thickened jejunal wall with improvement from prior exam. Improvement in surrounding mesenteric inflammatory changes.  The pancreas, spleen and adrenal glands are unremarkable.  There is residual mild distension of the cecum with gas with improvement from prior exam. The cecum measures only 2.7 cm in diameter. Again the cecum is located in mid anterior abdomen suspicious for mobile cecum. There is no thickening of cecal wall. No evidence of cecal volvulus.  Abdominal wall scarring is noted in right lower quadrant. The uterus and adnexa are unremarkable. The urinary bladder is unremarkable. Fifth again noted descending colon and sigmoid colon diverticula. No evidence of acute diverticulitis.  IMPRESSION: 1. Significant improvement in segmental inflammatory changes of jejunum. Only residual mild segmental gaseous dilatation of jejunum. Minimal residual wall thickening with improvement from prior exam. Improvement in adjacent mesenteric stranding. 2. Residual mild gaseous distended cecum with improvement from prior exam. The cecum measures 2.7 cm in diameter. No evidence of cecal volvulus or thickening of cecal wall. 3. There is skin thickening and stranding of subcutaneous fat and subcutaneous edema in right flank wall. Cellulitis or subcutaneous edema cannot be excluded. Clinical correlation is necessary 4. Normal appearing transplanted kidney. Again noted atrophic native kidneys. No hydronephrosis. 5. No small bowel or colonic obstruction.   Electronically Signed   By: Lahoma Crocker M.D.   On: 11/07/2014 12:30   Ct Abdomen Pelvis W Contrast  11/04/2014   CLINICAL DATA:  Umbilical pain, abdominal pain, diarrhea  EXAM: CT ABDOMEN AND  PELVIS WITH CONTRAST  TECHNIQUE: Multidetector CT imaging of the abdomen and pelvis was performed using the standard protocol following bolus administration of intravenous contrast.  CONTRAST:  150m OMNIPAQUE IOHEXOL 300 MG/ML  SOLN  COMPARISON:  5/28/ 16  FINDINGS: There is small right pleural effusion with right lower lobe posterior atelectasis. Again noted perihepatic ascites increased in size from prior exam. Postcholecystectomy surgical clips are noted. There is contrast material in distal esophagus probable from gastroesophageal reflux.  There is no evidence of gastric outlet obstruction.  No evidence of small bowel obstruction. Again noted significant segmental thickening of jejunal wall in left lower quadrant best seen in axial image 54. Mild worsening from prior exam. Wall thickness measures at least 1.1 cm. Again findings consistent with segmental enteritis probable infectious. Atherosclerotic calcifications of abdominal aorta and bilateral iliac artery again noted. Atherosclerotic calcifications bilateral renal artery origin. Atherosclerotic calcifications of celiac trunk and SMA origin.  There is again noted mild stranding of mesenteric fat adjacent to inflamed segment of small bowel in left abdomen. Small mesenteric lymph nodes are noted at this level probable reactive.  The native kidneys are small in size atrophic. Stable lesion seen native kidneys. Again noted normal function an appearance of transplanted kidney in right lower quadrant.  There is significant gaseous distension of the cecum in left mid abdomen anteriorly. The cecum measures at least 6 cm in diameter suspicious for segmental sickle ileus. There is no evidence of small bowel obstruction. Contrast material is noted in right colon and transverse colon. 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 Small  amount of ascites is noted in right paracolic  gutter. Normal appendix is partially visualized in axial image 59. There is small amount of pelvic ascites in right lower pelvis. The uterus and adnexa are unremarkable. No distal colonic obstruction. Few colonic diverticula are noted in left colon. There is contrast material in left colon and sigmoid colon.  Delayed renal images shows normal excretion of transplanted kidney.  Sagittal images of the spine are unremarkable. The pancreas, spleen and adrenal glands are unremarkable. No focal hepatic mass. Status postcholecystectomy.  IMPRESSION: 1. There is slight worsening in segmental inflammatory changes in jejunum in left abdomen. Slight worsening of thickening of the jejunal wall up to 1.1 cm. Again findings consistent with segmental enteritis. This may be infectious in nature or due to lupus enteritis 2. Mild increased perihepatic ascites. 3. There is small right pleural effusion with right lower lobe posterior atelectasis. 4. There is significant gaseous distension of the cecum. Measures up to 6 cm. There is mild angulation and anterior position of the cecum . Findings suspicious for mobile cecum. There is no thickening of cecal wall to suggest cecal volvulus. There is no evidence of obstruction. Cecal ileus cannot be excluded. 5. Normal appendix. No colonic obstruction. Contrast material is noted throughout the colon. 6. Small amount of pelvic ascites. 7. Normal function and appearance of transplanted kidney. Again noted atrophic native kidneys.   Electronically Signed   By: Lahoma Crocker M.D.   On: 11/04/2014 17:02   Ct Abdomen Pelvis W Contrast  11/02/2014   CLINICAL DATA:  Acute abdominal pain  EXAM: CT ABDOMEN AND PELVIS WITH CONTRAST  TECHNIQUE: Multidetector CT imaging of the abdomen and pelvis was performed using the standard protocol following bolus administration of intravenous contrast.  CONTRAST:  183m OMNIPAQUE IOHEXOL 300 MG/ML  SOLN  COMPARISON:  06/28/2014  FINDINGS: BODY WALL: No contributory  findings.  LOWER CHEST: Extensive coronary atherosclerosis for age  ABDOMEN/PELVIS:  Liver: No focal abnormality.  Biliary: Cholecystectomy with chronic intra and extrahepatic biliary duct enlargement  Pancreas: Unremarkable.  Spleen: Single coarse calcification, likely granulomatous.  Adrenals: Unremarkable.  Kidneys and ureters: There is a transplant kidney in the right lower quadrant, already excreting contrast. Would always consult with transplant surgeon prior to administering IV contrast. Severe native renal atrophy with stable low-density renal lesions which are indeterminate due to soft tissue density, but size stable since 2013, 22 mm on the left and 16 mm on the right.  Bladder: Unremarkable.  Reproductive: No pathologic findings.  Bowel: There is severe wall thickening of a jejunum segment with low-density submucosal appearance consistent with edema. The mesentery is diffusely edematous. The associated vessels appear widely patent. There is no evidence of perforation or abscess. No bowel obstruction. Distal colonic diverticulosis. Normal appendix.  Retroperitoneum: No mass or adenopathy.  Peritoneum: Small ascites around the liver and in the pelvis, considered reactive.  Vascular: No acute abnormality.  OSSEOUS: No acute abnormalities.  IMPRESSION: 1. Severe short-segment jejunitis, likely infectious or lupus enteritis (given patient's history). 2. Bilateral native renal lesions are indeterminate due to density, but size stable from 2013. 3. Additional chronic findings are stable from prior noted above.   Electronically Signed   By: JMonte FantasiaM.D.   On: 11/02/2014 04:41     Medical Consultants:    Gastroenterology  Anti-Infectives:    Rocephin 5/28 -->11/09/14  Flagyl 5/28 -->11/09/14  Subjective:   MMadelin Headingssays her pain is no better today.  Nauseated.  Tolerating FL  diet.  Still reports loose stools.  Objective:    Filed Vitals:   11/09/14 0530 11/09/14 1300 11/09/14 2135  11/10/14 0411  BP: 161/94 148/82 144/78 168/72  Pulse: 75 74 77 68  Temp: 97.7 F (36.5 C) 97.8 F (36.6 C) 97.6 F (36.4 C) 98.4 F (36.9 C)  TempSrc: Oral Oral Oral Oral  Resp: '18 18 18 18  ' Height:      Weight: 84.7 kg (186 lb 11.7 oz)   87.7 kg (193 lb 5.5 oz)  SpO2: 98% 98% 100% 100%    Intake/Output Summary (Last 24 hours) at 11/10/14 0802 Last data filed at 11/10/14 0759  Gross per 24 hour  Intake   1380 ml  Output   1350 ml  Net     30 ml    Exam: Gen:  NAD Cardiovascular:  RRR, No M/R/G Respiratory:  Lungs CTAB, diminished in the bases. Gastrointestinal:  Abdomen soft,  tender in the upper quadrants + BS Extremities:  No C/E/C   Data Reviewed:    Labs: Basic Metabolic Panel:  Recent Labs Lab 11/05/14 0410 11/06/14 0420 11/07/14 0440 11/07/14 0507 11/08/14 0433 11/09/14 0449  NA 139 142  --  139 137 138  K 2.9* 3.0*  --  3.3* 3.4* 4.0  CL 107 104  --  105 105 107  CO2 22 23  --  18* 25 27  GLUCOSE 78 69  --  112* 230* 248*  BUN 9 5*  --  '10 14 10  ' CREATININE 0.63 0.65  --  0.64 0.50 0.50  CALCIUM 8.1* 8.4*  --  8.2* 8.5* 8.6*  MG  --  1.3* 1.7  --   --   --    GFR Estimated Creatinine Clearance: 86.2 mL/min (by C-G formula based on Cr of 0.5). Liver Function Tests:  Recent Labs Lab 11/06/14 0420  AST 16  ALT 14  ALKPHOS 57  BILITOT 1.3*  PROT 5.6*  ALBUMIN 2.8*   No results for input(s): LIPASE, AMYLASE in the last 168 hours. Coagulation profile  Recent Labs Lab 11/06/14 0420 11/07/14 0507 11/08/14 0433 11/09/14 0449 11/10/14 0500  INR 3.63* 3.12* 3.47* 3.53* 4.17*    CBC:  Recent Labs Lab 11/04/14 0446 11/05/14 0410 11/06/14 0420 11/07/14 0507 11/08/14 0433  WBC 12.3* 10.6* 10.4 5.9 7.1  HGB 7.8* 7.9* 8.3* 8.7* 8.9*  HCT 24.1* 24.8* 25.8* 27.1* 27.4*  MCV 92.3 91.9 92.5 91.9 89.0  PLT 158 163 198 267 312   CBG (last 3)   Recent Labs  11/09/14 1641 11/09/14 2134 11/10/14 0751  GLUCAP 242* 186* 186*      Sepsis Labs:  Recent Labs Lab 11/05/14 0410 11/06/14 0420 11/07/14 0507 11/08/14 0433  WBC 10.6* 10.4 5.9 7.1   Microbiology Recent Results (from the past 240 hour(s))  Clostridium Difficile by PCR     Status: None   Collection Time: 11/05/14  3:18 AM  Result Value Ref Range Status   C difficile by pcr NEGATIVE NEGATIVE Final  Clostridium Difficile by PCR     Status: None   Collection Time: 11/08/14  8:44 PM  Result Value Ref Range Status   C difficile by pcr NEGATIVE NEGATIVE Final     Medications:   . atorvastatin  40 mg Oral q1800  . calcitRIOL  0.25 mcg Oral Daily  . darifenacin  7.5 mg Oral Daily  . diltiazem  240 mg Oral Daily  . donepezil  5 mg Oral QHS  . folic  acid  1 mg Oral Daily  . insulin aspart  0-15 Units Subcutaneous TID WC  . insulin aspart  0-5 Units Subcutaneous QHS  . losartan  100 mg Oral Daily  . methylPREDNISolone (SOLU-MEDROL) injection  60 mg Intravenous Q12H  . mycophenolate  250 mg Oral BID  . pantoprazole (PROTONIX) IV  40 mg Intravenous Q12H  . potassium chloride  40 mEq Oral BID  . sertraline  100 mg Oral Daily  . sodium chloride  3 mL Intravenous Q12H  . tacrolimus  1 mg Oral BID  . Warfarin - Pharmacist Dosing Inpatient   Does not apply q1800   Continuous Infusions: . sodium chloride 75 mL/hr at 11/10/14 0049    Time spent: 25 minutes.   LOS: 8 days   [facility] Pager [phone]. If unable to reach me by pager, please call my cell phone at [phone].  *Please refer to [email or web address], password [email or web address] to get updated schedule on who will round on this patient, as hospitalists switch teams weekly. If 7PM-7AM, please contact night-coverage at [email or web address], password [email or web address] for any overnight needs.  11/10/2014, 8:02 AM

## 2014-11-10 NOTE — Progress Notes (Signed)
ANTICOAGULATION CONSULT NOTE - Follow Up Consult  Pharmacy Consult for warfarin Indication:hx PE, DVT and stroke  Allergies  Allergen Reactions  . Oxycodone-Acetaminophen Shortness Of Breath and Nausea Only  . Propoxyphene N-Acetaminophen Shortness Of Breath and Nausea Only  . Sulfonamide Derivatives Shortness Of Breath and Nausea Only  . Codeine Nausea Only  . Latex Rash  . Metoprolol Rash   Patient Measurements: Height: 5\' 4"  (162.6 cm) Weight: 193 lb 5.5 oz (87.7 kg) IBW/kg (Calculated) : 54.7  Vital Signs: Temp: 98.4 F (36.9 C) (06/05 0411) Temp Source: Oral (06/05 0411) BP: 168/72 mmHg (06/05 0411) Pulse Rate: 68 (06/05 0411)  Labs:  Recent Labs  11/08/14 0433 11/09/14 0449 11/10/14 0500  HGB 8.9*  --   --   HCT 27.4*  --   --   PLT 312  --   --   LABPROT 34.2* 34.6* 39.2*  INR 3.47* 3.53* 4.17*  CREATININE 0.50 0.50  --    Estimated Creatinine Clearance: 86.2 mL/min (by C-G formula based on Cr of 0.5).  Medications:  Scheduled:  . atorvastatin  40 mg Oral q1800  . calcitRIOL  0.25 mcg Oral Daily  . darifenacin  7.5 mg Oral Daily  . diltiazem  240 mg Oral Daily  . donepezil  5 mg Oral QHS  . folic acid  1 mg Oral Daily  . insulin aspart  0-15 Units Subcutaneous TID WC  . insulin aspart  0-5 Units Subcutaneous QHS  . losartan  100 mg Oral Daily  . methylPREDNISolone (SOLU-MEDROL) injection  60 mg Intravenous Q12H  . mycophenolate  250 mg Oral BID  . pantoprazole (PROTONIX) IV  40 mg Intravenous Q12H  . phytonadione (VITAMIN K) IV  5 mg Intravenous Once  . potassium chloride  40 mEq Oral BID  . sertraline  100 mg Oral Daily  . sodium chloride  3 mL Intravenous Q12H  . tacrolimus  1 mg Oral BID  . Warfarin - Pharmacist Dosing Inpatient   Does not apply q1800   Assessment:  46 yoF admitted with c/o diffuse abd pain, N/V. 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 Jejunitis on CT, treating with Cipro/Flagyl. Other hx:  Lupus, Renal transplant on immunomodulators. INR > 10 on admission, treated with IV/PO Vit K + FFP.  Warfarin resumed 5/29 with INR 1.58.   Today 11/10/2014  INR now up to 4.17 (s/p vit K 5mg  IV & PO on 5/28, 5mg  PO on 6/1)   Hgb low but stable, plt ok  Flagyl (started 5/28) can increase INR, poor po intake  No bleeding documented  Goal of Therapy:  INR 2-3  Per Dr. Rockne Menghini, ok to resume warfarin when INR <3   Plan:   Continue to hold warfarin today, last dose5/30  VitK 5mg  IVPB today  Daily INR  Monitor for signs of bleeding or thrombosis  Enteroscopy Mionday  Thank you for the consult,   [name] PharmD Pager [phone] 11/10/2014, 10:29 AM

## 2014-11-11 ENCOUNTER — Encounter (HOSPITAL_COMMUNITY)
Admission: EM | Disposition: A | Discharge status: Skilled Nursing Facility | Payer: Self-pay | Source: Home / Self Care | Attending: Internal Medicine

## 2014-11-11 ENCOUNTER — Encounter (HOSPITAL_COMMUNITY): Payer: Self-pay | Admitting: Gastroenterology

## 2014-11-11 DIAGNOSIS — R935 Abnormal findings on diagnostic imaging of other abdominal regions, including retroperitoneum: Secondary | ICD-10-CM | POA: Diagnosis present

## 2014-11-11 HISTORY — PX: ENTEROSCOPY: SHX5533

## 2014-11-11 LAB — GLUCOSE, CAPILLARY
Glucose-Capillary: 174 mg/dL — ABNORMAL HIGH (ref 65–99)
Glucose-Capillary: 237 mg/dL — ABNORMAL HIGH (ref 65–99)
Glucose-Capillary: 161 mg/dL — ABNORMAL HIGH (ref 65–99)
Glucose-Capillary: 182 mg/dL — ABNORMAL HIGH (ref 65–99)
Glucose-Capillary: 210 mg/dL — ABNORMAL HIGH (ref 65–99)

## 2014-11-11 LAB — PROTIME-INR
INR: 1.31 (ref 0.00–1.49)
Prothrombin Time: 16.4 seconds — ABNORMAL HIGH (ref 11.6–15.2)

## 2014-11-11 LAB — BASIC METABOLIC PANEL
Anion gap: 8 (ref 5–15)
BUN: 8 mg/dL (ref 6–20)
CO2: 29 mmol/L (ref 22–32)
Calcium: 9 mg/dL (ref 8.9–10.3)
Chloride: 101 mmol/L (ref 101–111)
Creatinine, Ser: 0.45 mg/dL (ref 0.44–1.00)
GFR calc Af Amer: >60 mL/min (ref >60)
GFR calc non Af Amer: >60 mL/min (ref >60)
Glucose, Bld: 233 mg/dL — ABNORMAL HIGH (ref 65–99)
Potassium: 4.7 mmol/L (ref 3.5–5.1)
Sodium: 138 mmol/L (ref 135–145)

## 2014-11-11 LAB — CBC
HCT: 34 % — ABNORMAL LOW (ref 36.0–46.0)
Hemoglobin: 10.9 g/dL — ABNORMAL LOW (ref 12.0–15.0)
MCH: 28.8 pg (ref 26.0–34.0)
MCHC: 32.1 g/dL (ref 30.0–36.0)
MCV: 89.9 fL (ref 78.0–100.0)
Platelets: 297 10*3/uL (ref 150–400)
RBC: 3.78 MIL/uL — ABNORMAL LOW (ref 3.87–5.11)
RDW: 15.5 % (ref 11.5–15.5)
WBC: 9.7 10*3/uL (ref 4.0–10.5)

## 2014-11-11 LAB — SEDIMENTATION RATE: Sed Rate: 15 mm/hr (ref 0–22)

## 2014-11-11 SURGERY — ENTEROSCOPY
Anesthesia: Moderate Sedation

## 2014-11-11 MED ORDER — WARFARIN SODIUM 5 MG PO TABS
5.0000 mg | ORAL_TABLET | Freq: Once | ORAL | Status: AC
  Administered 2014-11-11: 5 mg via ORAL
  Filled 2014-11-11: qty 1

## 2014-11-11 MED ORDER — DIPHENHYDRAMINE HCL 50 MG/ML IJ SOLN
INTRAMUSCULAR | Status: AC
  Filled 2014-11-11: qty 1

## 2014-11-11 MED ORDER — MIDAZOLAM HCL 5 MG/ML IJ SOLN
INTRAMUSCULAR | Status: AC
  Filled 2014-11-11: qty 2

## 2014-11-11 MED ORDER — HYDRALAZINE HCL 20 MG/ML IJ SOLN
10.0000 mg | Freq: Once | INTRAMUSCULAR | Status: AC
  Administered 2014-11-11: 10 mg via INTRAVENOUS
  Filled 2014-11-11: qty 1

## 2014-11-11 MED ORDER — MIDAZOLAM HCL 5 MG/ML IJ SOLN
INTRAMUSCULAR | Status: AC
  Filled 2014-11-11: qty 1

## 2014-11-11 MED ORDER — FENTANYL CITRATE (PF) 100 MCG/2ML IJ SOLN
INTRAMUSCULAR | Status: AC
  Filled 2014-11-11: qty 2

## 2014-11-11 MED ORDER — BUTAMBEN-TETRACAINE-BENZOCAINE 2-2-14 % EX AERO
INHALATION_SPRAY | CUTANEOUS | Status: DC | PRN
  Administered 2014-11-11: 2 via TOPICAL

## 2014-11-11 MED ORDER — INSULIN GLARGINE 100 UNIT/ML ~~LOC~~ SOLN
10.0000 [IU] | Freq: Every day | SUBCUTANEOUS | Status: DC
  Administered 2014-11-11 – 2014-11-14 (×4): 10 [IU] via SUBCUTANEOUS
  Filled 2014-11-11 (×4): qty 0.1

## 2014-11-11 MED ORDER — FENTANYL CITRATE (PF) 100 MCG/2ML IJ SOLN
INTRAMUSCULAR | Status: DC | PRN
  Administered 2014-11-11 (×2): 25 ug via INTRAVENOUS

## 2014-11-11 MED ORDER — FLUCONAZOLE 100 MG PO TABS
100.0000 mg | ORAL_TABLET | Freq: Every day | ORAL | Status: DC
  Administered 2014-11-11 – 2014-11-14 (×4): 100 mg via ORAL
  Filled 2014-11-11 (×5): qty 1

## 2014-11-11 MED ORDER — MIDAZOLAM HCL 10 MG/2ML IJ SOLN
INTRAMUSCULAR | Status: DC | PRN
  Administered 2014-11-11 (×2): 2 mg via INTRAVENOUS

## 2014-11-11 NOTE — Care Management Note (Signed)
Case Management Note  Patient Details  Name: Connie Ruiz MRN: [id number] Date of Birth: 1961/03/28  Subjective/Objective:    Await HHC choice recommend HHRN/HHPT/HH Nurse's aide.                Action/Plan:d/c plan home.   Expected Discharge Date:  11/05/14               Expected Discharge Plan:  [place] (declining SNF. Wants home w/HHC.Will await choice.)  In-House Referral:  Clinical Social Work  Discharge planning Services  CM Consult  Post Acute Care Choice:    Choice offered to:  Patient  DME Arranged:    DME Agency:     HH Arranged:    [place] Agency:     Status of Service:  In process, will continue to follow  Medicare Important Message Given:  Yes Date Medicare IM Given:  11/05/14 Medicare IM give by:  Dessa Phi Date Additional Medicare IM Given:  11/11/14 Additional Medicare Important Message give by:  Dessa Phi  If discussed at Long Length of Stay Meetings, dates discussed:    Additional Comments:  Dessa Phi, RN 11/11/2014, 4:28 PM

## 2014-11-11 NOTE — Progress Notes (Signed)
ANTICOAGULATION CONSULT NOTE - Follow Up Consult  Pharmacy Consult for warfarin Indication:hx PE, DVT and stroke  Allergies  Allergen Reactions  . Oxycodone-Acetaminophen Shortness Of Breath and Nausea Only  . Propoxyphene N-Acetaminophen Shortness Of Breath and Nausea Only  . Sulfonamide Derivatives Shortness Of Breath and Nausea Only  . Codeine Nausea Only  . Latex Rash  . Metoprolol Rash   Patient Measurements: Height: 5\' 4"  (162.6 cm) Weight: 193 lb 2 oz (87.6 kg) IBW/kg (Calculated) : 54.7  Vital Signs: Temp: 98.1 F (36.7 C) (06/06 0630) Temp Source: Oral (06/06 0630) BP: 134/80 mmHg (06/06 0630) Pulse Rate: 76 (06/06 0630)  Labs:  Recent Labs  11/09/14 0449 11/10/14 0500 11/11/14 0418  HGB  --   --  10.9*  HCT  --   --  34.0*  PLT  --   --  297  LABPROT 34.6* 39.2* 16.4*  INR 3.53* 4.17* 1.31  CREATININE 0.50  --  0.45   Estimated Creatinine Clearance: 86.2 mL/min (by C-G formula based on Cr of 0.45).  Medications:  Scheduled:  . atorvastatin  40 mg Oral q1800  . calcitRIOL  0.25 mcg Oral Daily  . darifenacin  7.5 mg Oral Daily  . diltiazem  240 mg Oral Daily  . donepezil  5 mg Oral QHS  . folic acid  1 mg Oral Daily  . insulin aspart  0-15 Units Subcutaneous TID WC  . insulin aspart  0-5 Units Subcutaneous QHS  . insulin glargine  10 Units Subcutaneous Daily  . losartan  100 mg Oral Daily  . methylPREDNISolone (SOLU-MEDROL) injection  60 mg Intravenous Q12H  . mycophenolate  250 mg Oral BID  . pantoprazole (PROTONIX) IV  40 mg Intravenous Q12H  . potassium chloride  40 mEq Oral BID  . sertraline  100 mg Oral Daily  . sodium chloride  3 mL Intravenous Q12H  . tacrolimus  1 mg Oral BID  . Warfarin - Pharmacist Dosing Inpatient   Does not apply q1800   Assessment:  85 yoF admitted with c/o diffuse abd pain, N/V. 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 Jejunitis on CT, treating with Cipro/Flagyl. Other hx: Lupus,  Renal transplant on immunomodulators. INR > 10 on admission, treated with IV/PO Vit K + FFP.  Warfarin resumed 5/29 with INR 1.58.   Today 11/11/2014  INR now 1.31 subtherapeutic (s/p vit K 5mg  IV & PO on 5/28, 5mg  PO on 6/1 and 5mg  IV on 6/5)   Hgb low but stable, plt ok  Flagyl discontinued,  po intake improved  No bleeding documented  Enteroscopy scheduled for today  Goal of Therapy:  INR 2-3  Per Dr. Rockne Menghini, ok to resume warfarin when INR <3   Plan:   Warfarin 5mg  po today @ 1800  Daily INR  Monitor for signs of bleeding or thrombosis  Thank you for the consult,   Dolly Rias RPh 11/11/2014, 12:35 PM Pager [phone]

## 2014-11-11 NOTE — Progress Notes (Signed)
CSW reviewed PT evaluation recommending SNF at discharge. CSW spoke with patient who declined SNF, stating that she has a caregiver who comes to her house. CSW asked patient if caregiver is through an agency or a friend and patient responded that the agency is called "[facility]". RNCM, Juliann Pulse made aware & will set up home care services.   No further CSW needs identified - CSW signing off.   Raynaldo Opitz, [facility] Clinical Social Worker cell #: [phone]

## 2014-11-11 NOTE — Interval H&P Note (Signed)
History and Physical Interval Note:  11/11/2014 2:40 PM  Connie Ruiz  has presented today for surgery, with the diagnosis of jejunitis  The various methods of treatment have been discussed with the patient and family. After consideration of risks, benefits and other options for treatment, the patient has consented to  Procedure(s): ENTEROSCOPY (N/A) as a surgical intervention .  The patient's history has been reviewed, patient examined, no change in status, stable for surgery.  I have reviewed the patient's chart and labs.  Questions were answered to the patient's satisfaction.     Pricilla Riffle. [place] Plan

## 2014-11-11 NOTE — Progress Notes (Signed)
   Progress Note   Connie Ruiz MRN:[id number] DOB: 03/06/1961 DOA: 11/02/2014 PCP: ELLISON, SEAN, MD   Brief Narrative:   Connie Ruiz is an 54 y.o. female with complicated medical history including HTN, DM, lupus and antiphospholipid syndrome, s/p renal transplant requiring long term immunosuppressant, CVA in 2011, PE in 07/2010 and requiring long term AC with Coumadin, last known colonoscopy in [facility] in 2002 which was reportedly unremarkable, admitted 11/02/14 with jejunitis being managed with Flagyl/Rocephin.  Hospital course has been complicated by persistent abd pain despite IV ABX regimen. Repeat CT abd and pelvis on 11/04/14 showed potentially worsening diverticulitis and GI team subsequently consulted for further assistance.   Assessment/Plan:   Principal Problem:  Acute abd pain secondary to jejunitis - Differential of etiology is infectious versus vasculitic secondary to lupus. ESR 65, ANA + 1:1280. - Repeat CT scan done 11/07/14 which showed improvement in bowel wall temporally related to steroid initiation. - Status post 7 days of therapy with Flagyl/Rocephin, continue Solu-Medrol. C. difficile PCR negative 2. - Diet advanced to FL 11/07/14, tolerating. - GI to do endoscopy to evaluate proximal small bowel 11/11/14 given failure to improve.  Active Problems:   Steroid induced hyperglycemia - Currently being managed with SSI, moderate scale. CBGs 186-237. Will add 10 units of Lantus.   Essential hypertension -  Continue Cardizem and Cozaar. Blood pressure stable.   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  - Pt is followed by Dr Luk at [facility] for rheumatology.  - Prior workup showed negative dsDNA, SSA and SSB, negative Smith antibody, negative hepatitis profile. - Patient was ANA + 1:1280 with an abnormal lupus inhibitor. - Solu-Medrol started 11/06/14 due to  concerns for vasculitis induced segmental jejunitis.   DVT (deep venous thrombosis) - Coumadin currently on hold, INR remains mildly supratherapeutic.   Anemia of chronic disease, lupus and IDA - FOBT requested. No evidence of active significant GI bleeding. - The patient is [name] and declines blood products for support. - Iron, B-12, folate WNL.   Depression - Continue Zoloft.    Hypokalemia / hypomagnesemia - Resolved with supplementation.   Supratherapeutic INR - Reversed with vitamin K with INR of 1.2 today. - Coumadin per pharmacy.   Obesity  - Body mass index is 31.2 kg/(m^2).    DVT prophylaxis  - On chronic anticoagulation with Coumadin. INR subtherapeutic after being given vitamin K 11/10/14.  Code Status: Full.  Family Communication: Updated her brother by telephone 11/06/14. No family at the bedside today.  Dr. Brodie spoke with her sister Connie Ruiz over the weekend. Disposition Plan: Home when pain better controlled and responding to treatment, likely another 1-2 days.    IV Access:    Peripheral IV   Procedures and diagnostic studies:   Ct Abdomen Pelvis W Contrast  11/07/2014   CLINICAL DATA:  Abdominal pain, inflammation of jejunal loops  EXAM: CT ABDOMEN AND PELVIS WITH CONTRAST  TECHNIQUE: Multidetector CT imaging of the abdomen and pelvis was performed using the standard protocol following bolus administration of intravenous contrast.  CONTRAST:  100mL OMNIPAQUE IOHEXOL 300 MG/ML  SOLN  COMPARISON:  11/04/2014  FINDINGS: Lung bases shows small right pleural effusion with right lower lobe posteriorly atelectasis.  There is skin thickening and stranding of subcutaneous fat with subcutaneous edema in right flank wall. Clinical correlation is necessary to exclude cellulitis.  There is small perihepatic ascites. The   patient is status postcholecystectomy.  Atherosclerotic calcifications of abdominal aorta and iliac arteries again noted. Again noted  atrophic native kidneys. Normal appearing transplanted kidney in right lower quadrant.  There is significant improvement in the segmental inflammatory changes in jejunum. Only residual mild gaseous distended segmental jejunal loop at the site of previous inflammation. Minimal residual thickened jejunal wall with improvement from prior exam. Improvement in surrounding mesenteric inflammatory changes.  The pancreas, spleen and adrenal glands are unremarkable.  There is residual mild distension of the cecum with gas with improvement from prior exam. The cecum measures only 2.7 cm in diameter. Again the cecum is located in mid anterior abdomen suspicious for mobile cecum. There is no thickening of cecal wall. No evidence of cecal volvulus.  Abdominal wall scarring is noted in right lower quadrant. The uterus and adnexa are unremarkable. The urinary bladder is unremarkable. Fifth again noted descending colon and sigmoid colon diverticula. No evidence of acute diverticulitis.  IMPRESSION: 1. Significant improvement in segmental inflammatory changes of jejunum. Only residual mild segmental gaseous dilatation of jejunum. Minimal residual wall thickening with improvement from prior exam. Improvement in adjacent mesenteric stranding. 2. Residual mild gaseous distended cecum with improvement from prior exam. The cecum measures 2.7 cm in diameter. No evidence of cecal volvulus or thickening of cecal wall. 3. There is skin thickening and stranding of subcutaneous fat and subcutaneous edema in right flank wall. Cellulitis or subcutaneous edema cannot be excluded. Clinical correlation is necessary 4. Normal appearing transplanted kidney. Again noted atrophic native kidneys. No hydronephrosis. 5. No small bowel or colonic obstruction.   Electronically Signed   By: Lahoma Crocker M.D.   On: 11/07/2014 12:30   Ct Abdomen Pelvis W Contrast  11/04/2014   CLINICAL DATA:  Umbilical pain, abdominal pain, diarrhea  EXAM: CT ABDOMEN AND  PELVIS WITH CONTRAST  TECHNIQUE: Multidetector CT imaging of the abdomen and pelvis was performed using the standard protocol following bolus administration of intravenous contrast.  CONTRAST:  143m OMNIPAQUE IOHEXOL 300 MG/ML  SOLN  COMPARISON:  5/28/ 16  FINDINGS: There is small right pleural effusion with right lower lobe posterior atelectasis. Again noted perihepatic ascites increased in size from prior exam. Postcholecystectomy surgical clips are noted. There is contrast material in distal esophagus probable from gastroesophageal reflux.  There is no evidence of gastric outlet obstruction.  No evidence of small bowel obstruction. Again noted significant segmental thickening of jejunal wall in left lower quadrant best seen in axial image 54. Mild worsening from prior exam. Wall thickness measures at least 1.1 cm. Again findings consistent with segmental enteritis probable infectious. Atherosclerotic calcifications of abdominal aorta and bilateral iliac artery again noted. Atherosclerotic calcifications bilateral renal artery origin. Atherosclerotic calcifications of celiac trunk and SMA origin.  There is again noted mild stranding of mesenteric fat adjacent to inflamed segment of small bowel in left abdomen. Small mesenteric lymph nodes are noted at this level probable reactive.  The native kidneys are small in size atrophic. Stable lesion seen native kidneys. Again noted normal function an appearance of transplanted kidney in right lower quadrant.  There is significant gaseous distension of the cecum in left mid abdomen anteriorly. The cecum measures at least 6 cm in diameter suspicious for segmental sickle ileus. There is no evidence of small bowel obstruction. Contrast material is noted in right colon and transverse colon. 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 Small amount of ascites  is noted in right paracolic  gutter. Normal appendix is partially visualized in axial image 59. There is small amount of pelvic ascites in right lower pelvis. The uterus and adnexa are unremarkable. No distal colonic obstruction. Few colonic diverticula are noted in left colon. There is contrast material in left colon and sigmoid colon.  Delayed renal images shows normal excretion of transplanted kidney.  Sagittal images of the spine are unremarkable. The pancreas, spleen and adrenal glands are unremarkable. No focal hepatic mass. Status postcholecystectomy.  IMPRESSION: 1. There is slight worsening in segmental inflammatory changes in jejunum in left abdomen. Slight worsening of thickening of the jejunal wall up to 1.1 cm. Again findings consistent with segmental enteritis. This may be infectious in nature or due to lupus enteritis 2. Mild increased perihepatic ascites. 3. There is small right pleural effusion with right lower lobe posterior atelectasis. 4. There is significant gaseous distension of the cecum. Measures up to 6 cm. There is mild angulation and anterior position of the cecum . Findings suspicious for mobile cecum. There is no thickening of cecal wall to suggest cecal volvulus. There is no evidence of obstruction. Cecal ileus cannot be excluded. 5. Normal appendix. No colonic obstruction. Contrast material is noted throughout the colon. 6. Small amount of pelvic ascites. 7. Normal function and appearance of transplanted kidney. Again noted atrophic native kidneys.   Electronically Signed   By: Liviu  Pop M.D.   On: 11/04/2014 17:02   Ct Abdomen Pelvis W Contrast  11/02/2014   CLINICAL DATA:  Acute abdominal pain  EXAM: CT ABDOMEN AND PELVIS WITH CONTRAST  TECHNIQUE: Multidetector CT imaging of the abdomen and pelvis was performed using the standard protocol following bolus administration of intravenous contrast.  CONTRAST:  100mL OMNIPAQUE IOHEXOL 300 MG/ML  SOLN  COMPARISON:  06/28/2014  FINDINGS: BODY WALL: No contributory  findings.  LOWER CHEST: Extensive coronary atherosclerosis for age  ABDOMEN/PELVIS:  Liver: No focal abnormality.  Biliary: Cholecystectomy with chronic intra and extrahepatic biliary duct enlargement  Pancreas: Unremarkable.  Spleen: Single coarse calcification, likely granulomatous.  Adrenals: Unremarkable.  Kidneys and ureters: There is a transplant kidney in the right lower quadrant, already excreting contrast. Would always consult with transplant surgeon prior to administering IV contrast. Severe native renal atrophy with stable low-density renal lesions which are indeterminate due to soft tissue density, but size stable since 2013, 22 mm on the left and 16 mm on the right.  Bladder: Unremarkable.  Reproductive: No pathologic findings.  Bowel: There is severe wall thickening of a jejunum segment with low-density submucosal appearance consistent with edema. The mesentery is diffusely edematous. The associated vessels appear widely patent. There is no evidence of perforation or abscess. No bowel obstruction. Distal colonic diverticulosis. Normal appendix.  Retroperitoneum: No mass or adenopathy.  Peritoneum: Small ascites around the liver and in the pelvis, considered reactive.  Vascular: No acute abnormality.  OSSEOUS: No acute abnormalities.  IMPRESSION: 1. Severe short-segment jejunitis, likely infectious or lupus enteritis (given patient's history). 2. Bilateral native renal lesions are indeterminate due to density, but size stable from 2013. 3. Additional chronic findings are stable from prior noted above.   Electronically Signed   By: Jonathon  Watts M.D.   On: 11/02/2014 04:41     Medical Consultants:    Gastroenterology  Anti-Infectives:    Rocephin 5/28 -->11/09/14  Flagyl 5/28 -->11/09/14  Subjective:   Connie Ruiz says she is the same, with ongoing nausea, diarrhea and abdominal pain.  Objective:      Filed Vitals:   11/10/14 0411 11/10/14 1419 11/10/14 2127 11/11/14 0630  BP:  168/72 157/74 144/80 134/80  Pulse: 68 72 73 76  Temp: 98.4 F (36.9 C) 97.8 F (36.6 C) 98.2 F (36.8 C) 98.1 F (36.7 C)  TempSrc: Oral Oral Oral Oral  Resp: _0 Height:      Weight: 87.7 kg (193 lb 5.5 oz)   87.6 kg (193 lb 2 oz)  SpO2: 100% 100% 100% 100%    Intake/Output Summary (Last 24 hours) at 11/11/14 0801 Last data filed at 11/11/14 0755  Gross per 24 hour  Intake   2280 ml  Output   3375 ml  Net  -1095 ml    Exam: Gen:  NAD, affect brighter Cardiovascular:  RRR, No M/R/G Respiratory:  Lungs CTAB, diminished in the bases. Gastrointestinal:  Abdomen soft,  tender in the upper quadrants + BS Extremities:  No C/E/C   Data Reviewed:    Labs: Basic Metabolic Panel:  Recent Labs Lab 11/06/14 0420 11/07/14 0440 11/07/14 0507 11/08/14 0433 11/09/14 0449 11/11/14 0418  NA 142  --  139 137 138 138  K 3.0*  --  3.3* 3.4* 4.0 4.7  CL 104  --  105 105 107 101  CO2 23  --  18* _1 GLUCOSE 69  --  112* 230* 248* 233*  BUN 5*  --  _2 CREATININE 0.65  --  0.64 0.50 0.50 0.45  CALCIUM 8.4*  --  8.2* 8.5* 8.6* 9.0  MG 1.3* 1.7  --   --   --   --    GFR Estimated Creatinine Clearance: 86.2 mL/min (by C-G formula based on Cr of 0.45). Liver Function Tests:  Recent Labs Lab 11/06/14 0420  AST 16  ALT 14  ALKPHOS 57  BILITOT 1.3*  PROT 5.6*  ALBUMIN 2.8*   No results for input(s): LIPASE, AMYLASE in the last 168 hours. Coagulation profile  Recent Labs Lab 11/07/14 0507 11/08/14 0433 11/09/14 0449 11/10/14 0500 11/11/14 0418  INR 3.12* 3.47* 3.53* 4.17* 1.31    CBC:  Recent Labs Lab 11/05/14 0410 11/06/14 0420 11/07/14 0507 11/08/14 0433 11/11/14 0418  WBC 10.6* 10.4 5.9 7.1 9.7  HGB 7.9* 8.3* 8.7* 8.9* 10.9*  HCT 24.8* 25.8* 27.1* 27.4* 34.0*  MCV 91.9 92.5 91.9 89.0 89.9  PLT 163 198 267 312 297   CBG (last 3)   Recent Labs  11/10/14 0751 11/10/14 1155 11/10/14 2124  GLUCAP 186* 215* 237*      Sepsis Labs:  Recent Labs Lab 11/06/14 0420 11/07/14 0507 11/08/14 0433 11/11/14 0418  WBC 10.4 5.9 7.1 9.7   Microbiology Recent Results (from the past 240 hour(s))  Clostridium Difficile by PCR     Status: None   Collection Time: 11/05/14  3:18 AM  Result Value Ref Range Status   C difficile by pcr NEGATIVE NEGATIVE Final  Clostridium Difficile by PCR     Status: None   Collection Time: 11/08/14  8:44 PM  Result Value Ref Range Status   C difficile by pcr NEGATIVE NEGATIVE Final     Medications:   . atorvastatin  40 mg Oral q1800  . calcitRIOL  0.25 mcg Oral Daily  . darifenacin  7.5 mg Oral Daily  . diltiazem  240 mg Oral Daily  . donepezil  5 mg Oral QHS  . folic acid  1 mg Oral Daily  . insulin aspart  0-15  Units Subcutaneous TID WC  . insulin aspart  0-5 Units Subcutaneous QHS  . losartan  100 mg Oral Daily  . methylPREDNISolone (SOLU-MEDROL) injection  60 mg Intravenous Q12H  . mycophenolate  250 mg Oral BID  . pantoprazole (PROTONIX) IV  40 mg Intravenous Q12H  . potassium chloride  40 mEq Oral BID  . sertraline  100 mg Oral Daily  . sodium chloride  3 mL Intravenous Q12H  . tacrolimus  1 mg Oral BID  . Warfarin - Pharmacist Dosing Inpatient   Does not apply q1800   Continuous Infusions: . sodium chloride 75 mL/hr at 11/11/14 0252    Time spent: 25 minutes.   LOS: 9 days   ,  [facility] Pager [phone]. If unable to reach me by pager, please call my cell phone at [phone].  *Please refer to [email or web address], password [email or web address] to get updated schedule on who will round on this patient, as hospitalists switch teams weekly. If 7PM-7AM, please contact night-coverage at [email or web address], password [email or web address] for any overnight needs.  11/11/2014, 8:01 AM            

## 2014-11-11 NOTE — H&P (View-Only) (Signed)
Referring Provider: [facility] Primary Care Physician:  Renato Shin, MD Primary Gastroenterologist:  Dr. Fuller Plan  Reason for Consultation:  jejunitis    HPI: Connie Ruiz is a 54 y.o. female with a complicated medical history. 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 She previously underwent colonoscopy at [facility] in January 2002 left lower quadrant pain and hematochezia the colonoscopy was normal. She was evaluated by Dr. Fuller Plan in November 2012 to discuss colorectal cancer screening. She underwent an air contrast barium enema which was a limited exam as the patient was unable to retain the balloon. She was noted to have mild diverticulosis.  She presented to the emergency room on May 28 with abdominal pain. She states her abdominal pain came on gradually 3-4 days prior to admission and increased in intensity. She reports the abdominal pain was diffuse and was associated with nausea vomiting and intermittent diarrhea. Although her stools with food she felt like she was constipated. She denied fever or chills. She had an abdominal pelvic CT in the emergency room that showed severe short segment jejunitis. She was started on Cipro and Flagyl as well as follow rest and pain management. Her pain has progressed. Today she states it is a 12 on a scale of 1-10. She has not vomited yesterday or today but states she feels nauseous even with liquids. She states she had a small formed bowel movement today. She denies seeing bright red blood per rectum or melena. She had a repeat CT scan yesterday that showed worsening of the segmental inflammatory changes in the jejunum with worsening of the thickening of the jejunal wall. She has been  noted to have increased perihepatic ascites, small right pleural effusion, and significant gaseous distention of the cecum. There is mild angulation of the anterior portion of the cecum. No thickening of the wall of the cecum to suggest a volvulus.   Past Medical History  Diagnosis Date  . THYROID NODULE, LEFT 04/10/2009  . HYPERLIPIDEMIA 08/21/2007  . GOUT 08/21/2007  . HYPERTENSION 08/21/2007    Dr. Andree Elk, [place] 08/21/2007  . CEREBROVASCULAR ACCIDENT, ACUTE 04/15/2010  . GERD 08/21/2007  . RENAL INSUFFICIENCY 08/21/2007  . LUPUS 08/21/2007  . OSTEOPOROSIS 08/21/2007    Rheumatol at [facility]  . DVT, HX OF 08/21/2007  . CLOSTRIDIUM DIFFICILE COLITIS, HX OF 08/21/2007  . KIDNEY TRANSPLANTATION, HX OF 08/22/2007    s/p renal transplant-Dr. Andree Elk, [facility]  . Pulmonary embolism 07/16/2010  . Renal failure   . Current use of long term anticoagulation     Dr. Andree Elk, [facility]  . Depression     Dr. Andree Elk, [facility]  . History of stroke with residual effects   . Right sided weakness   . CVA 04/17/2010  . Tachycardia   . DIABETES MELLITUS, TYPE II 08/21/2007    Past Surgical History  Procedure Laterality Date  . Cholecystectomy    . Tubal ligation    . Kidney transplant Right 2009  . Renal biopsy, open  1981    Prior to Admission medications   Medication Sig Start Date End Date Taking? Authorizing Provider  atorvastatin (LIPITOR) 40 MG tablet Take 1 tablet (40 mg total) by mouth daily. 10/04/14  Yes Renato Shin, MD  calcitRIOL (ROCALTROL) 0.25 MCG capsule TAKE ONE CAPSULE BY  MOUTH DAILY 04/30/14  Yes Sean Ellison, MD  diltiazem (TIAZAC) 240 MG 24 hr capsule Take 1 capsule (240 mg total) by mouth daily. 10/04/14  Yes Sean Ellison, MD  donepezil (ARICEPT) 5 MG tablet Take 1 tablet (5 mg total) by mouth at bedtime. 05/29/14  Yes Adam R Jaffe, DO  esomeprazole (NEXIUM) 20 MG capsule TAKE ONE CAPSULE BY MOUTH EVERY MORNING BEFORE BREAKFAST 02/13/14  Yes Sean Ellison, MD    folic acid (FOLVITE) 1 MG tablet Take 1 mg by mouth daily.   Yes Historical Provider, MD  losartan (COZAAR) 100 MG tablet Take 1 tablet (100 mg total) by mouth daily. 11/15/13  Yes Sean Ellison, MD  mycophenolate (CELLCEPT) 250 MG capsule Take 250 mg by mouth 2 (two) times daily.   Yes Historical Provider, MD  ondansetron (ZOFRAN ODT) 4 MG disintegrating tablet 4mg ODT q4 hours prn nausea/vomiting Patient taking differently: Take 4 mg by mouth every 8 (eight) hours as needed for nausea.  06/28/14  Yes Scott Goldston, MD  potassium chloride (K-DUR) 10 MEQ tablet Take 1 tablet (10 mEq total) by mouth daily. 04/07/14  Yes Sean Ellison, MD  predniSONE (DELTASONE) 5 MG tablet Take 5 mg by mouth daily with breakfast.   Yes Historical Provider, MD  senna-docusate (SENOKOT-S) 8.6-50 MG per tablet Take 1 tablet by mouth at bedtime as needed for mild constipation. 06/19/13  Yes Vijaya Akula, MD  sertraline (ZOLOFT) 100 MG tablet Take 1 tablet (100 mg total) by mouth daily. 10/04/14  Yes Sean Ellison, MD  solifenacin (VESICARE) 10 MG tablet Take 10 mg by mouth daily.    Yes Historical Provider, MD  tacrolimus (PROGRAF) 1 MG capsule Take 1 mg by mouth 2 (two) times daily.  04/16/14  Yes Historical Provider, MD  warfarin (COUMADIN) 5 MG tablet Take 1 tablet (5 mg total) by mouth as directed. Patient taking differently: Take 7.5 mg by mouth daily at 6 PM.  07/19/14  Yes Jonathan J Berry, MD  Incontinence Supplies MISC "pull-up" type, 6 per day 08/10/13   Sean Ellison, MD  nystatin (MYCOSTATIN) powder Apply 1 g topically 4 (four) times daily as needed. For yeast under breast    Historical Provider, MD  predniSONE (DELTASONE) 1 MG tablet Take 1 tablet (1 mg total) by mouth daily with breakfast. Patient not taking: Reported on 11/02/2014 06/25/13   Daniel Thompson V, MD    Current Facility-Administered Medications  Medication Dose Route Frequency Provider Last Rate Last Dose  . 0.9 %  sodium chloride infusion    Intravenous Continuous Iskra M Myers, MD 75 mL/hr at 11/04/14 2100    . 0.9 %  sodium chloride infusion   Intravenous Once Iskra M Myers, MD      . acetaminophen (TYLENOL) tablet 650 mg  650 mg Oral Q6H PRN Pranav M Patel, MD       Or  . acetaminophen (TYLENOL) suppository 650 mg  650 mg Rectal Q6H PRN Pranav M Patel, MD      . atorvastatin (LIPITOR) tablet 40 mg  40 mg Oral q1800 Pranav M Patel, MD   40 mg at 11/04/14 1735  . calcitRIOL (ROCALTROL) capsule 0.25 mcg  0.25 mcg Oral Daily Pranav M Patel, MD   0.25 mcg at 11/04/14 0945  . cefTRIAXone (ROCEPHIN) 2 g in dextrose 5 % 50 mL IVPB - Premix  2 g Intravenous Q0600 Pranav M Patel, MD   2 g at 11/05/14 0548  . darifenacin (ENABLEX) 24 hr tablet 7.5 mg    7.5 mg Oral Daily Lavina Hamman, MD   7.5 mg at 11/04/14 0945  . diltiazem (TIAZAC) 24 hr capsule 240 mg  240 mg Oral Daily Lavina Hamman, MD   240 mg at 11/04/14 0946  . donepezil (ARICEPT) tablet 5 mg  5 mg Oral QHS Lavina Hamman, MD   5 mg at 11/04/14 2254  . folic acid (FOLVITE) tablet 1 mg  1 mg Oral Daily Lavina Hamman, MD   1 mg at 11/04/14 0945  . HYDROmorphone (DILAUDID) injection 1 mg  1 mg Intravenous Q3H PRN Theodis Blaze, MD   1 mg at 11/04/14 2254  . metroNIDAZOLE (FLAGYL) IVPB 500 mg  500 mg Intravenous 3 times per day Lavina Hamman, MD   500 mg at 11/05/14 1219  . morphine (MSIR) tablet 15 mg  15 mg Oral Q4H PRN Lavina Hamman, MD   15 mg at 11/05/14 0555  . mycophenolate (CELLCEPT) capsule 250 mg  250 mg Oral BID Lavina Hamman, MD   250 mg at 11/04/14 2254  . ondansetron (ZOFRAN) tablet 4 mg  4 mg Oral Q6H PRN Lavina Hamman, MD       Or  . ondansetron [employer]) injection 4 mg  4 mg Intravenous Q6H PRN Lavina Hamman, MD   4 mg at 11/03/14 1410  . pantoprazole (PROTONIX) injection 40 mg  40 mg Intravenous Q12H Lavina Hamman, MD   40 mg at 11/04/14 2254  . predniSONE (DELTASONE) tablet 5 mg  5 mg Oral Q breakfast Lavina Hamman, MD   5 mg at 11/05/14 0729  .  senna-docusate (Senokot-S) tablet 1 tablet  1 tablet Oral QHS PRN Lavina Hamman, MD      . sertraline (ZOLOFT) tablet 100 mg  100 mg Oral Daily Lavina Hamman, MD   100 mg at 11/04/14 0945  . sodium chloride 0.9 % injection 3 mL  3 mL Intravenous Q12H Lavina Hamman, MD   3 mL at 11/04/14 2200  . tacrolimus (PROGRAF) capsule 1 mg  1 mg Oral BID Lavina Hamman, MD   1 mg at 11/04/14 2254  . Warfarin - Pharmacist Dosing Inpatient   Does not apply [id number] Theodis Blaze, MD   Stopped at 11/03/14 1800    Allergies as of 11/02/2014 - Review Complete 11/02/2014  Allergen Reaction Noted  . Oxycodone-acetaminophen Shortness Of Breath and Nausea Only   . Propoxyphene n-acetaminophen Shortness Of Breath and Nausea Only   . Sulfonamide derivatives Shortness Of Breath and Nausea Only   . Codeine Nausea Only 10/21/2009  . Latex Rash 06/17/2011  . Metoprolol Rash 08/31/2013    Family History  Problem Relation Age of Onset  . Heart attack Mother   . Heart disease Father   . Asthma Sister   . Asthma Sister   . Asthma Daughter   . Cancer Maternal Grandfather     prostate  . Cancer Paternal Grandfather     colon    History   Social History  . Marital Status: Divorced    Spouse Name: N/A  . Number of Children: 1  . Years of Education: N/A   Occupational History  . DISABILITY    Social History Main Topics  . Smoking status: Never Smoker   . Smokeless tobacco: Never Used  . Alcohol Use: No  . Drug Use: No  . Sexual Activity: No   Other Topics Concern  . Not on file  Social History Narrative   Retired   Regular exercise-no    Review of Systems: Gen: Denies any fever, chills, sweats, anorexia, fatigue, weakness, malaise, weight loss, and sleep disorder CV: Denies chest pain, angina, palpitations, syncope, orthopnea, PND, peripheral edema, and claudication. Resp: Denies dyspnea at rest, dyspnea with exercise, cough, sputum, wheezing, coughing up blood, and pleurisy. GI: Denies  vomiting blood, jaundice, and fecal incontinence.   Denies dysphagia or odynophagia. GU : Denies urinary burning, blood in urine, urinary frequency, urinary hesitancy, nocturnal urination, and urinary incontinence. MS: Has intermittent joint pain Derm: Denies rash, itching, dry skin, hives, moles, warts, or unhealing ulcers.  Psych: Has depression and has had memory loss in the past Heme: Denies bruising, bleeding Neuro:  Denies any headaches, dizziness, paresthesias.   Physical Exam: Vital signs in last 24 hours: Temp:  [98.7 F (37.1 C)] 98.7 F (37.1 C) (05/31 0532) Pulse Rate:  [104-106] 104 (05/31 0532) Resp:  [18-19] 19 (05/31 0532) BP: (130-160)/(58-81) 146/81 mmHg (05/31 0532) SpO2:  [92 %-93 %] 92 % (05/31 0532) Weight:  [190 lb 11.2 oz (86.5 kg)] 190 lb 11.2 oz (86.5 kg) (05/31 0532) Last BM Date: 11/05/14 General:   Alert,  Well-developed, well-nourished, African-American female who appears uncomfortable Head:  Normocephalic and atraumatic. Eyes:  Sclera clear, no icterus.   Conjunctiva pink. Ears:  Normal auditory acuity. Nose:  No deformity, discharge,  or lesions. Mouth:  No deformity or lesions.   Neck:  Supple; no masses or thyromegaly. Lungs:  Clear throughout to auscultation.    . Heart:  Regular rate and rhythm; systolic murmur Abdomen:  Soft, nondistended, diffuse moderate tenderness with no rebound or guarding, BS active,nonpalp mass or hsm.   Rectal:  Liquid brown stool heme-positive Msk:  Symmetrical without gross deformities. . Pulses:  Normal pulses noted. Extremities: Without clubbing or edema. Neurologic: Alert and  oriented x4;  grossly normal neurologically. Skin: Intact without significant lesions or rashes.. Psych:  Alert and cooperative. Normal mood and affect.  Intake/Output from previous day: 05/30 0701 - 05/31 0700 In: 1975 [I.V.:1725; IV Piggyback:250] Out: 200 [Urine:200] Intake/Output this shift:    Lab Results:  Recent Labs   11/03/14 0510 11/04/14 0446 11/05/14 0410  WBC 13.2* 12.3* 10.6*  HGB 8.5* 7.8* 7.9*  HCT 25.8* 24.1* 24.8*  PLT 163 158 163   BMET  Recent Labs  11/03/14 0510 11/04/14 0446 11/05/14 0410  NA 138 138 139  K 3.4* 3.5 2.9*  CL 110 111 107  CO2 20* 20* 22  GLUCOSE 129* 106* 78  BUN 29* 20 9  CREATININE 0.83 0.72 0.63  CALCIUM 8.4* 8.2* 8.1*    PT/INR  Recent Labs  11/04/14 0446 11/05/14 0410  LABPROT 21.2* 26.6*  INR 1.84* 2.48*     Studies/Results: Ct Abdomen Pelvis W Contrast  11/04/2014   CLINICAL DATA:  Umbilical pain, abdominal pain, diarrhea  EXAM: CT ABDOMEN AND PELVIS WITH CONTRAST  TECHNIQUE: Multidetector CT imaging of the abdomen and pelvis was performed using the standard protocol following bolus administration of intravenous contrast.  CONTRAST:  100mL OMNIPAQUE IOHEXOL 300 MG/ML  SOLN  COMPARISON:  5/28/ 16  FINDINGS: There is small right pleural effusion with right lower lobe posterior atelectasis. Again noted perihepatic ascites increased in size from prior exam. Postcholecystectomy surgical clips are noted. There is contrast material in distal esophagus probable from gastroesophageal reflux.  There is no evidence of gastric outlet obstruction.  No evidence of small bowel obstruction. Again noted significant segmental   thickening of jejunal wall in left lower quadrant best seen in axial image 54. Mild worsening from prior exam. Wall thickness measures at least 1.1 cm. Again findings consistent with segmental enteritis probable infectious. Atherosclerotic calcifications of abdominal aorta and bilateral iliac artery again noted. Atherosclerotic calcifications bilateral renal artery origin. Atherosclerotic calcifications of celiac trunk and SMA origin.  There is again noted mild stranding of mesenteric fat adjacent to inflamed segment of small bowel in left abdomen. Small mesenteric lymph nodes are noted at this level probable reactive.  The native kidneys are small in  size atrophic. Stable lesion seen native kidneys. Again noted normal function an appearance of transplanted kidney in right lower quadrant.  There is significant gaseous distension of the cecum in left mid abdomen anteriorly. The cecum measures at least 6 cm in diameter suspicious for segmental sickle ileus. There is no evidence of small bowel obstruction. Contrast material is noted in right colon and transverse colon. 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 Small amount of ascites is noted in right paracolic gutter. Normal appendix is partially visualized in axial image 59. There is small amount of pelvic ascites in right lower pelvis. The uterus and adnexa are unremarkable. No distal colonic obstruction. Few colonic diverticula are noted in left colon. There is contrast material in left colon and sigmoid colon.  Delayed renal images shows normal excretion of transplanted kidney.  Sagittal images of the spine are unremarkable. The pancreas, spleen and adrenal glands are unremarkable. No focal hepatic mass. Status postcholecystectomy.  IMPRESSION: 1. There is slight worsening in segmental inflammatory changes in jejunum in left abdomen. Slight worsening of thickening of the jejunal wall up to 1.1 cm. Again findings consistent with segmental enteritis. This may be infectious in nature or due to lupus enteritis 2. Mild increased perihepatic ascites. 3. There is small right pleural effusion with right lower lobe posterior atelectasis. 4. There is significant gaseous distension of the cecum. Measures up to 6 cm. There is mild angulation and anterior position of the cecum . Findings suspicious for mobile cecum. There is no thickening of cecal wall to suggest cecal volvulus. There is no evidence of obstruction. Cecal ileus cannot be excluded. 5. Normal appendix. No colonic obstruction. Contrast material is noted throughout the colon.  6. Small amount of pelvic ascites. 7. Normal function and appearance of transplanted kidney. Again noted atrophic native kidneys.   Electronically Signed   By: Liviu  Pop M.D.   On: 11/04/2014 17:02    IMPRESSION/PLAN: #1. Abdominal pain. Patient admitted with abdominal pain and had CT scan that showed segmental inflammatory changes in the jejunum in the left abdomen at his, worse on repeat CT. CT is consistent with segmental enteritis which may be infectious or vasculitic due to her lupus. No evidence of intra-abdominal hematoma mentioned on CT scan. Unlikely perforated jejunal diverticulum as no evidence of perforation mentioned on CT scan. Would continue anti-biotics. Continue IV fluids. Would make nothing by mouth at this point. Will consider enteroscopy for evaluation of small bowel tomorrow if INR closer to normal range.. Will need to stop warfarin today and let INR drift to 1.6 or lower. Will check ESR and ANA today   #2. Hypertension. Currently on Cardizem.  #3. History of DVT. Has been on chronic Coumadin which has been on hold since the 29th. Received 2 doses of vitamin K 5 mg IV. INR on admission was above 10. On   May 30 was 1.84, today is 2.48. May need additional vitamin K.  #4. Anemia of chronic disease, hemoglobin this morning 7.9, was 7.8 yesterday. Patient found to have heme positive stools today. Received 1 unit of packed cells on the 30th. Trend CBC. Would keep hemoglobin above 8.  #5. Hypokalemia. Replacement ordered. Will check electrolytes tomorrow morning.  Will review with Dr. Brodie for further recommendations.   Hvozdovic, Lori P PA-C 11/05/2014,  Pager  Attending MD note:   I have taken a history, examined the patient, and reviewed the chart as well as the CT scan of the abdomen. I agree with the Advanced Practitioner's impression and recommendations. It appears to be an acute segmental  Jejunitis involving about 1 foot of mid jejunum, ( SMA distribution), The major   mesenteric arteries are patent, suspect  Vasculitis could be a possibility. Also infection. Crohn's disease less likely since TI appears normal and she has no Sx's of chronic diarrhea etc. Perforated diverticulum not a consideration. Also embolic event not likely with INR 2.4. But ischemic jejunitis due to a low flow state is a possibility. I suggest to cont bowl rest, antibiotic coverage, re-imaging in 48-72 hours unless her condition deteriorates. Enteroscopy not likely to reach that far into the jejunum.  Dora Brodie,MD [facility] Pager # [phone]  

## 2014-11-11 NOTE — Care Management Note (Signed)
Case Management Note  Patient Details  Name: Connie Ruiz MRN: [id number] Date of Birth: February 24, 1961  Subjective/Objective:    PT- SNF. Loose stools,for enteroscopy.GI following.ivf @75 ,iv solumedrol q12,iv protonix.                Action/Plan:d/c plan SNF.   Expected Discharge Date:  11/05/14               Expected Discharge Plan:  Skilled Nursing Facility  In-House Referral:  Clinical Social Work  Discharge planning Services  CM Consult  Post Acute Care Choice:    Choice offered to:     DME Arranged:    DME Agency:     HH Arranged:    [place] Agency:     Status of Service:  In process, will continue to follow  Medicare Important Message Given:  Yes Date Medicare IM Given:  11/05/14 Medicare IM give by:  Dessa Phi Date Additional Medicare IM Given:  11/11/14 Additional Medicare Important Message give by:  Dessa Phi  If discussed at Long Length of Stay Meetings, dates discussed:    Additional Comments:  Dessa Phi, RN 11/11/2014, 12:11 PM

## 2014-11-11 NOTE — Op Note (Signed)
[facility] [address] [place], [id number]   ENTEROSCOPY with biospies PROCEDURE REPORT     EXAM DATE: 11/11/2014  PATIENT NAME:      Connie, Ruiz           MR #:      [id number] BIRTHDATE:       11-Jul-1960      VISIT #:     [id number]  ATTENDING:     Ladene Artist, MD, [facility]     STATUS:     outpatient  ASSISTANT:      Sharon Mt and Doretha Imus MD: [facility] ASA CLASS:        Class III INDICATIONS:  The patient is a 54 yr old female here for an enteroscopy procedure due to abnormal CT of small bowel, jejunitis.  PROCEDURE PERFORMED:     Small bowel enteroscopy with biopsy MEDICATIONS:     Fentanyl 50 mcg IV and Versed 4 mg IV CONSENT: The patient understands the risks and benefits of the procedure and understands that these risks include, but are not limited to: sedation, allergic reaction, infection, perforation and/or bleeding. Alternative means of evaluation and treatment include, among others: physical exam, x-rays, and/or surgical intervention. The patient elects to proceed with this endoscopic procedure. DESCRIPTION OF PROCEDURE: During intra-op preparation period all mechanical & medical equipment was checked for proper function. Hand hygiene and appropriate measures for infection prevention was taken. After the risks, benefits and alternatives of the procedure were thoroughly explained, Informed consent was verified, confirmed and timeout was successfully executed by the treatment team. The    endoscope was introduced through the mouth and advanced to the proximal jejunum jejunum. The prep was The overall prep quality was excellent.. The instrument was then slowly withdrawn while examining the mucosa circumferentially. The scope was then completely withdrawn from the patient and the procedure terminated. The pulse, BP, and O2 saturation were monitored and documented by the physician and the nursing staff  throughout the entire procedure.  The patient was cared for as planned according to standard protocol, then discharged to recovery in stable condition and with appropriate post procedure care. Estimated blood loss is zero unless otherwise noted in this procedure report. ESOPHAGUS: White exudates consistent with candidiasis were found in the entire esophagus.   The esophagus was otherwise normal. STOMACH: Two nodules were located in the gastric antrum.   Two soft round 25mm nodules.  Multiple sampling biopsies were performed. Small hiatal hernia on retroflexed view. The stomach otherwise appeared normal. DUODENUM: The duodenal mucosa showed no abnormalities in the entire duodenum. JEJUNUM: The exam showed no abnormalities in the proximal jejunum. about 40-50 cm of jejunum examined. Multiple random biopsies of normal appearing mucosa were performed at the distal most point of the exam.  ADVERSE EVENTS:      There were no immediate complications.  IMPRESSIONS:     1. White exudates consistent with candidiasis in the esophagus 2. Small hiatal hernia 3. Exam otherwise normal to the proximal jejunum  RECOMMENDATIONS:     1. Await pathology results  2. Diflucan for 7 days for candida esophagitis   Ladene Artist, MD, [facility] eSigned:  Ladene Artist, MD, [facility] 11/11/2014 3:18 PM

## 2014-11-12 ENCOUNTER — Encounter (HOSPITAL_COMMUNITY): Payer: Self-pay | Admitting: Internal Medicine

## 2014-11-12 DIAGNOSIS — R935 Abnormal findings on diagnostic imaging of other abdominal regions, including retroperitoneum: Secondary | ICD-10-CM

## 2014-11-12 DIAGNOSIS — B3781 Candidal esophagitis: Secondary | ICD-10-CM | POA: Diagnosis present

## 2014-11-12 HISTORY — DX: Candidal esophagitis: B37.81

## 2014-11-12 LAB — GLUCOSE, CAPILLARY
Glucose-Capillary: 210 mg/dL — ABNORMAL HIGH (ref 65–99)
Glucose-Capillary: 246 mg/dL — ABNORMAL HIGH (ref 65–99)
Glucose-Capillary: 154 mg/dL — ABNORMAL HIGH (ref 65–99)
Glucose-Capillary: 211 mg/dL — ABNORMAL HIGH (ref 65–99)

## 2014-11-12 LAB — PROTIME-INR
INR: 1.07 (ref 0.00–1.49)
Prothrombin Time: 14.1 seconds (ref 11.6–15.2)

## 2014-11-12 MED ORDER — INSULIN ASPART 100 UNIT/ML ~~LOC~~ SOLN: 0.0000 [IU] | Freq: Three times a day (TID) | SUBCUTANEOUS | Status: DC

## 2014-11-12 MED ORDER — INSULIN ASPART 100 UNIT/ML ~~LOC~~ SOLN
0.0000 [IU] | Freq: Three times a day (TID) | SUBCUTANEOUS | Status: DC
  Administered 2014-11-12: 4 [IU] via SUBCUTANEOUS
  Administered 2014-11-12 (×2): 7 [IU] via SUBCUTANEOUS
  Administered 2014-11-13: 4 [IU] via SUBCUTANEOUS
  Administered 2014-11-13: 7 [IU] via SUBCUTANEOUS
  Administered 2014-11-13: 4 [IU] via SUBCUTANEOUS
  Administered 2014-11-14: 7 [IU] via SUBCUTANEOUS
  Administered 2014-11-14: 4 [IU] via SUBCUTANEOUS

## 2014-11-12 MED ORDER — ENOXAPARIN SODIUM 100 MG/ML ~~LOC~~ SOLN
100.0000 mg | Freq: Once | SUBCUTANEOUS | Status: AC
  Administered 2014-11-12: 100 mg via SUBCUTANEOUS
  Filled 2014-11-12: qty 1

## 2014-11-12 MED ORDER — INSULIN ASPART 100 UNIT/ML ~~LOC~~ SOLN
0.0000 [IU] | Freq: Every day | SUBCUTANEOUS | Status: DC
  Administered 2014-11-12 – 2014-11-13 (×2): 2 [IU] via SUBCUTANEOUS

## 2014-11-12 MED ORDER — WARFARIN SODIUM 7.5 MG PO TABS
7.5000 mg | ORAL_TABLET | Freq: Once | ORAL | Status: AC
  Administered 2014-11-12: 7.5 mg via ORAL
  Filled 2014-11-12: qty 1

## 2014-11-12 MED ORDER — ENOXAPARIN SODIUM 80 MG/0.8ML ~~LOC~~ SOLN
80.0000 mg | Freq: Two times a day (BID) | SUBCUTANEOUS | Status: DC
  Administered 2014-11-13 – 2014-11-14 (×3): 80 mg via SUBCUTANEOUS
  Filled 2014-11-12 (×3): qty 0.8

## 2014-11-12 NOTE — Progress Notes (Addendum)
Progress Note   Connie Ruiz QBH:[id number] DOB: 29-Sep-1960 DOA: 11/02/2014 PCP: Renato Shin, MD   Brief Narrative:   Connie Ruiz is an 54 y.o. female with complicated medical history including HTN, DM, lupus and antiphospholipid syndrome, s/p renal transplant requiring long term immunosuppressant, CVA in 2011, PE in 07/2010 and requiring long term AC with Coumadin, last known colonoscopy in [place] in 2002 which was reportedly unremarkable, admitted 11/02/14 with jejunitis being managed with Flagyl/Rocephin.  Hospital course has been complicated by persistent abd pain despite IV ABX regimen. Repeat CT abd and pelvis on 11/04/14 showed potentially worsening diverticulitis and GI team subsequently consulted for further assistance. 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 She subsequently underwent EGD 11/11/14 and was found to have a relatively normal-appearing small bowel. Biopsies are pending. Her diet has been advanced, and she can be discharged home when pathology back, as this is needed to determine if she needs further steroid treatment and INR therapeutic.  Assessment/Plan:   Principal Problem:  Acute abd pain secondary to jejunitis - Differential of etiology is infectious versus vasculitic secondary to lupus. ESR 65, ANA + 1:1280. - Repeat CT scan done 11/07/14 which showed improvement in bowel wall temporally related to steroid initiation. - Status post 7 days of therapy with Flagyl/Rocephin, continue Solu-Medrol. C. difficile PCR negative 2. - Diet advanced to [facility] 11/07/14, tolerating. - Status post EGD 11/11/14 to evaluate proximal small bowel, described as grossly normal. Follow up pathology results. - Will advance diet today. She can likely be discharged if she can tolerate diet advancement.  Active Problems:   Candida esophagitis -  Diflucan started 11/11/14. Needs one week of therapy.    Steroid induced hyperglycemia - Currently being managed with SSI, moderate scale and 10 units of Lantus daily. CBGs 182-210.  - Change SSI to resistant scale.   Essential hypertension -  Continue Cardizem and Cozaar. Blood pressure stable.   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  - Pt is followed by Dr Annabelle Harman at [facility] for rheumatology.  - Prior workup showed negative dsDNA, SSA and SSB, negative Smith antibody, negative hepatitis profile. - Patient was ANA + 1:1280 with an abnormal lupus inhibitor. - Solu-Medrol started 11/06/14 due to concerns for vasculitis induced segmental jejunitis.   DVT (deep venous thrombosis) - Coumadin resumed 11/11/14 after INR reversed with vitamin K. - We'll add Lovenox until INR therapeutic.   Anemia of chronic disease, lupus and IDA - FOBT negative. No evidence of active significant GI bleeding. - The patient is [name] and declines blood products for support. - Iron, B-12, folate WNL.   Depression - Continue Zoloft.    Hypokalemia / hypomagnesemia - Resolved with supplementation.   Supratherapeutic INR - Reversed with vitamin K. - Coumadin resumed on 11/11/14 per pharmacy.   Obesity  - Body mass index is 31.2 kg/(m^2).    DVT prophylaxis  - On chronic anticoagulation with Coumadin. INR subtherapeutic after being given vitamin K 11/10/14.  Code Status: Full.  Family Communication: No family at the bedside today. Disposition Plan: Home when diet successfully advanced, pathology report back, and INR therapeutic.    IV Access:    Peripheral IV   Procedures and diagnostic studies:   Ct Abdomen Pelvis W Contrast  11/07/2014   CLINICAL DATA:  Abdominal pain, inflammation of jejunal loops  EXAM: CT ABDOMEN AND PELVIS WITH CONTRAST  TECHNIQUE:  Multidetector CT imaging of the abdomen and pelvis was performed using the standard protocol following bolus administration of intravenous contrast.  CONTRAST:  130m OMNIPAQUE IOHEXOL 300 MG/ML  SOLN  COMPARISON:  11/04/2014  FINDINGS: Lung bases shows small right pleural effusion with right lower lobe posteriorly atelectasis.  There is skin thickening and stranding of subcutaneous fat with subcutaneous edema in right flank wall. Clinical correlation is necessary to exclude cellulitis.  There is small perihepatic ascites. The patient is status postcholecystectomy.  Atherosclerotic calcifications of abdominal aorta and iliac arteries again noted. Again noted atrophic native kidneys. Normal appearing transplanted kidney in right lower quadrant.  There is significant improvement in the segmental inflammatory changes in jejunum. Only residual mild gaseous distended segmental jejunal loop at the site of previous inflammation. Minimal residual thickened jejunal wall with improvement from prior exam. Improvement in surrounding mesenteric inflammatory changes.  The pancreas, spleen and adrenal glands are unremarkable.  There is residual mild distension of the cecum with gas with improvement from prior exam. The cecum measures only 2.7 cm in diameter. Again the cecum is located in mid anterior abdomen suspicious for mobile cecum. There is no thickening of cecal wall. No evidence of cecal volvulus.  Abdominal wall scarring is noted in right lower quadrant. The uterus and adnexa are unremarkable. The urinary bladder is unremarkable. Fifth again noted descending colon and sigmoid colon diverticula. No evidence of acute diverticulitis.  IMPRESSION: 1. Significant improvement in segmental inflammatory changes of jejunum. Only residual mild segmental gaseous dilatation of jejunum. Minimal residual wall thickening with improvement from prior exam. Improvement in adjacent mesenteric stranding. 2. Residual mild gaseous distended  cecum with improvement from prior exam. The cecum measures 2.7 cm in diameter. No evidence of cecal volvulus or thickening of cecal wall. 3. There is skin thickening and stranding of subcutaneous fat and subcutaneous edema in right flank wall. Cellulitis or subcutaneous edema cannot be excluded. Clinical correlation is necessary 4. Normal appearing transplanted kidney. Again noted atrophic native kidneys. No hydronephrosis. 5. No small bowel or colonic obstruction.   Electronically Signed   By: LLahoma CrockerM.D.   On: 11/07/2014 12:30   Ct Abdomen Pelvis W Contrast  11/04/2014   CLINICAL DATA:  Umbilical pain, abdominal pain, diarrhea  EXAM: CT ABDOMEN AND PELVIS WITH CONTRAST  TECHNIQUE: Multidetector CT imaging of the abdomen and pelvis was performed using the standard protocol following bolus administration of intravenous contrast.  CONTRAST:  1066mOMNIPAQUE IOHEXOL 300 MG/ML  SOLN  COMPARISON:  5/28/ 16  FINDINGS: There is small right pleural effusion with right lower lobe posterior atelectasis. Again noted perihepatic ascites increased in size from prior exam. Postcholecystectomy surgical clips are noted. There is contrast material in distal esophagus probable from gastroesophageal reflux.  There is no evidence of gastric outlet obstruction.  No evidence of small bowel obstruction. Again noted significant segmental thickening of jejunal wall in left lower quadrant best seen in axial image 54. Mild worsening from prior exam. Wall thickness measures at least 1.1 cm. Again findings consistent with segmental enteritis probable infectious. Atherosclerotic calcifications of abdominal aorta and bilateral iliac artery again noted. Atherosclerotic calcifications bilateral renal artery origin. Atherosclerotic calcifications of celiac trunk and SMA origin.  There is again noted mild stranding of mesenteric fat adjacent to inflamed segment of small bowel in left abdomen. Small mesenteric lymph nodes are noted  at this  level probable reactive.  The native kidneys are small in size atrophic. Stable lesion seen native kidneys. Again noted normal function an appearance of transplanted kidney in right lower quadrant.  There is significant gaseous distension of the cecum in left mid abdomen anteriorly. The cecum measures at least 6 cm in diameter suspicious for segmental sickle ileus. There is no evidence of small bowel obstruction. Contrast material is noted in right colon and transverse colon. 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 Small amount of ascites is noted in right paracolic gutter. Normal appendix is partially visualized in axial image 59. There is small amount of pelvic ascites in right lower pelvis. The uterus and adnexa are unremarkable. No distal colonic obstruction. Few colonic diverticula are noted in left colon. There is contrast material in left colon and sigmoid colon.  Delayed renal images shows normal excretion of transplanted kidney.  Sagittal images of the spine are unremarkable. The pancreas, spleen and adrenal glands are unremarkable. No focal hepatic mass. Status postcholecystectomy.  IMPRESSION: 1. There is slight worsening in segmental inflammatory changes in jejunum in left abdomen. Slight worsening of thickening of the jejunal wall up to 1.1 cm. Again findings consistent with segmental enteritis. This may be infectious in nature or due to lupus enteritis 2. Mild increased perihepatic ascites. 3. There is small right pleural effusion with right lower lobe posterior atelectasis. 4. There is significant gaseous distension of the cecum. Measures up to 6 cm. There is mild angulation and anterior position of the cecum . Findings suspicious for mobile cecum. There is no thickening of cecal wall to suggest cecal volvulus. There is no evidence of obstruction. Cecal ileus cannot be excluded. 5. Normal appendix. No colonic  obstruction. Contrast material is noted throughout the colon. 6. Small amount of pelvic ascites. 7. Normal function and appearance of transplanted kidney. Again noted atrophic native kidneys.   Electronically Signed   By: Lahoma Crocker M.D.   On: 11/04/2014 17:02   Ct Abdomen Pelvis W Contrast  11/02/2014   CLINICAL DATA:  Acute abdominal pain  EXAM: CT ABDOMEN AND PELVIS WITH CONTRAST  TECHNIQUE: Multidetector CT imaging of the abdomen and pelvis was performed using the standard protocol following bolus administration of intravenous contrast.  CONTRAST:  166m OMNIPAQUE IOHEXOL 300 MG/ML  SOLN  COMPARISON:  06/28/2014  FINDINGS: BODY WALL: No contributory findings.  LOWER CHEST: Extensive coronary atherosclerosis for age  ABDOMEN/PELVIS:  Liver: No focal abnormality.  Biliary: Cholecystectomy with chronic intra and extrahepatic biliary duct enlargement  Pancreas: Unremarkable.  Spleen: Single coarse calcification, likely granulomatous.  Adrenals: Unremarkable.  Kidneys and ureters: There is a transplant kidney in the right lower quadrant, already excreting contrast. Would always consult with transplant surgeon prior to administering IV contrast. Severe native renal atrophy with stable low-density renal lesions which are indeterminate due to soft tissue density, but size stable since 2013, 22 mm on the left and 16 mm on the right.  Bladder: Unremarkable.  Reproductive: No pathologic findings.  Bowel: There is severe wall thickening of a jejunum segment with low-density submucosal appearance consistent with edema. The mesentery is diffusely edematous. The associated vessels appear widely patent. There is no evidence of perforation or abscess. No bowel obstruction. Distal colonic diverticulosis. Normal appendix.  Retroperitoneum: No mass or adenopathy.  Peritoneum: Small ascites around the liver and in the pelvis, considered reactive.  Vascular: No acute abnormality.  OSSEOUS:  No acute abnormalities.  IMPRESSION: 1.  Severe short-segment jejunitis, likely infectious or lupus enteritis (given patient's history). 2. Bilateral native renal lesions are indeterminate due to density, but size stable from 2013. 3. Additional chronic findings are stable from prior noted above.   Electronically Signed   By: Monte Fantasia M.D.   On: 11/02/2014 04:41     Medical Consultants:    Gastroenterology  Anti-Infectives:    Rocephin 5/28 -->11/09/14  Flagyl 5/28 -->11/09/14  Subjective:   Connie Ruiz says she is the same, with ongoing nausea, diarrhea (3 stools) with black appearing stools and 7/10 abdominal pain. Wants to eat fried chicken.  Objective:    Filed Vitals:   11/11/14 1600 11/11/14 2129 11/11/14 2209 11/12/14 0554  BP: 123/96 184/95 166/93 127/92  Pulse: 80 85  110  Temp: 97.6 F (36.4 C) 97.8 F (36.6 C)  98.2 F (36.8 C)  TempSrc: Oral Oral  Oral  Resp: _0 Height:      Weight:    82.8 kg (182 lb 8.7 oz)  SpO2: 100% 98%  98%    Intake/Output Summary (Last 24 hours) at 11/12/14 0810 Last data filed at 11/12/14 0700  Gross per 24 hour  Intake 1797.5 ml  Output    926 ml  Net  871.5 ml    Exam: Gen:  NAD, affect brighter Cardiovascular:  RRR, No M/R/G Respiratory:  Lungs CTAB, diminished in the bases. Gastrointestinal:  Abdomen soft,  tender in the upper quadrants + BS Extremities:  No C/E/C   Data Reviewed:    Labs: Basic Metabolic Panel:  Recent Labs Lab 11/06/14 0420 11/07/14 0440 11/07/14 0507 11/08/14 0433 11/09/14 0449 11/11/14 0418  NA 142  --  139 137 138 138  K 3.0*  --  3.3* 3.4* 4.0 4.7  CL 104  --  105 105 107 101  CO2 23  --  18* _1 GLUCOSE 69  --  112* 230* 248* 233*  BUN 5*  --  _2 CREATININE 0.65  --  0.64 0.50 0.50 0.45  CALCIUM 8.4*  --  8.2* 8.5* 8.6* 9.0  MG 1.3* 1.7  --   --   --   --    GFR Estimated Creatinine Clearance: 83.6 mL/min (by C-G formula based on Cr of 0.45). Liver Function Tests:  Recent Labs Lab  11/06/14 0420  AST 16  ALT 14  ALKPHOS 57  BILITOT 1.3*  PROT 5.6*  ALBUMIN 2.8*   No results for input(s): LIPASE, AMYLASE in the last 168 hours. Coagulation profile  Recent Labs Lab 11/08/14 0433 11/09/14 0449 11/10/14 0500 11/11/14 0418 11/12/14 0355  INR 3.47* 3.53* 4.17* 1.31 1.07    CBC:  Recent Labs Lab 11/06/14 0420 11/07/14 0507 11/08/14 0433 11/11/14 0418  WBC 10.4 5.9 7.1 9.7  HGB 8.3* 8.7* 8.9* 10.9*  HCT 25.8* 27.1* 27.4* 34.0*  MCV 92.5 91.9 89.0 89.9  PLT 198 267 312 297   CBG (last 3)   Recent Labs  11/11/14 1725 11/11/14 2158 11/12/14 0741  GLUCAP 182* 210* 210*     Sepsis Labs:  Recent Labs Lab 11/06/14 0420 11/07/14 0507 11/08/14 0433 11/11/14 0418  WBC 10.4 5.9 7.1 9.7   Microbiology Recent Results (from the past 240 hour(s))  Clostridium Difficile by PCR     Status: None   Collection Time: 11/05/14  3:18 AM  Result Value Ref Range Status   C difficile  by pcr NEGATIVE NEGATIVE Final  Clostridium Difficile by PCR     Status: None   Collection Time: 11/08/14  8:44 PM  Result Value Ref Range Status   C difficile by pcr NEGATIVE NEGATIVE Final     Medications:   . atorvastatin  40 mg Oral q1800  . calcitRIOL  0.25 mcg Oral Daily  . darifenacin  7.5 mg Oral Daily  . diltiazem  240 mg Oral Daily  . donepezil  5 mg Oral QHS  . fluconazole  100 mg Oral Daily  . folic acid  1 mg Oral Daily  . insulin aspart  0-15 Units Subcutaneous TID WC  . insulin aspart  0-5 Units Subcutaneous QHS  . insulin glargine  10 Units Subcutaneous Daily  . losartan  100 mg Oral Daily  . methylPREDNISolone (SOLU-MEDROL) injection  60 mg Intravenous Q12H  . mycophenolate  250 mg Oral BID  . pantoprazole (PROTONIX) IV  40 mg Intravenous Q12H  . potassium chloride  40 mEq Oral BID  . sertraline  100 mg Oral Daily  . sodium chloride  3 mL Intravenous Q12H  . tacrolimus  1 mg Oral BID  . Warfarin - Pharmacist Dosing Inpatient   Does not apply  q1800   Continuous Infusions: . sodium chloride 75 mL/hr at 11/12/14 0503    Time spent: 25 minutes.   LOS: 10 days   [facility] Pager [phone]. If unable to reach me by pager, please call my cell phone at [phone].  *Please refer to [email or web address], password [email or web address] to get updated schedule on who will round on this patient, as hospitalists switch teams weekly. If 7PM-7AM, please contact night-coverage at [email or web address], password [email or web address] for any overnight needs.  11/12/2014, 8:10 AM

## 2014-11-12 NOTE — Progress Notes (Addendum)
Pharmacy - Lovenox for VTE treatment (bridging)  Assessment: See note from Dolly Rias, PharmD for more information 50 yoF admitted with c/o diffuse abd pain, N/V. 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 Jejunitis on CT, treating with Cipro/Flagyl. Other hx: Lupus, Renal transplant on immunomodulators. INR > 10 on admission, treated with IV/PO Vit K + FFP. Warfarin resumed 5/29 with INR 1.58.  INR rising slowly; to begin Lovenox today to bridge until INR therapeutic   Today 11/12/2014  Hgb low but stable, plt ok  SCr low; CrCl 84 ml/min using SCr 0.8  Weight 82.8 kg  No bleeding documented  Plan:  Lovenox 100 mg SQ now, then 80 mg SQ q12 hr starting tomorrow AM  Warfarin & monitoring per previous notes  Recommend bridging for 5 days and until 2 consecutive therapeutic INR readings obtained  Monitor for signs of bleeding or thrombosis  Reuel Boom, PharmD Pager: [phone] 11/12/2014, 2:49 PM

## 2014-11-12 NOTE — Progress Notes (Signed)
ANTICOAGULATION CONSULT NOTE - Follow Up Consult  Pharmacy Consult for warfarin Indication:hx PE, DVT and stroke  Allergies  Allergen Reactions  . Oxycodone-Acetaminophen Shortness Of Breath and Nausea Only  . Propoxyphene N-Acetaminophen Shortness Of Breath and Nausea Only  . Sulfonamide Derivatives Shortness Of Breath and Nausea Only  . Codeine Nausea Only  . Latex Rash  . Metoprolol Rash   Patient Measurements: Height: 5\' 4"  (162.6 cm) Weight: 182 lb 8.7 oz (82.8 kg) IBW/kg (Calculated) : 54.7  Vital Signs: Temp: 98.2 F (36.8 C) (06/07 0554) Temp Source: Oral (06/07 0554) BP: 137/68 mmHg (06/07 0947) Pulse Rate: 116 (06/07 0947)  Labs:  Recent Labs  11/10/14 0500 11/11/14 0418 11/12/14 0355  HGB  --  10.9*  --   HCT  --  34.0*  --   PLT  --  297  --   LABPROT 39.2* 16.4* 14.1  INR 4.17* 1.31 1.07  CREATININE  --  0.45  --    Estimated Creatinine Clearance: 83.6 mL/min (by C-G formula based on Cr of 0.45).  Medications:  Scheduled:  . atorvastatin  40 mg Oral q1800  . calcitRIOL  0.25 mcg Oral Daily  . darifenacin  7.5 mg Oral Daily  . diltiazem  240 mg Oral Daily  . donepezil  5 mg Oral QHS  . fluconazole  100 mg Oral Daily  . folic acid  1 mg Oral Daily  . insulin aspart  0-20 Units Subcutaneous TID WC  . insulin aspart  0-5 Units Subcutaneous QHS  . insulin glargine  10 Units Subcutaneous Daily  . losartan  100 mg Oral Daily  . methylPREDNISolone (SOLU-MEDROL) injection  60 mg Intravenous Q12H  . mycophenolate  250 mg Oral BID  . pantoprazole (PROTONIX) IV  40 mg Intravenous Q12H  . potassium chloride  40 mEq Oral BID  . sertraline  100 mg Oral Daily  . sodium chloride  3 mL Intravenous Q12H  . tacrolimus  1 mg Oral BID  . Warfarin - Pharmacist Dosing Inpatient   Does not apply q1800   Assessment:  33 yoF admitted with c/o diffuse abd pain, N/V. 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 Jejunitis on CT, treating  with Cipro/Flagyl. Other hx: Lupus, Renal transplant on immunomodulators. INR > 10 on admission, treated with IV/PO Vit K + FFP.  Warfarin resumed 5/29 with INR 1.58.   Today 11/12/2014  INR =1.07 subtherapeutic (s/p vit K 5mg  IV & PO on 5/28, 5mg  PO on 6/1 and 5mg  IV on 6/5)   Hgb low but stable, plt ok  Flagyl discontinued,  po intake improved  No bleeding documented  Pt on diflucan:may decrease hepatic metabolism and increase the hypoprothrombinemic effect of Anticoagulants.   Goal of Therapy:  INR 2-3  Per Dr. Rockne Menghini, ok to resume warfarin when INR <3   Plan:   Warfarin 7.5mg  po today @ 1800  Daily INR  Monitor for signs of bleeding or thrombosis  Thank you for the consult,   Dolly Rias RPh 11/12/2014, 1:05 PM Pager [phone]

## 2014-11-12 NOTE — Progress Notes (Signed)
Taking over pt care from previous nurse. I do agree with her assessment. Patient is resting quietly.

## 2014-11-12 NOTE — Progress Notes (Signed)
    Progress Note   Subjective  Pain slightly better today.    Objective   Vital signs in last 24 hours: Temp:  [97.6 F (36.4 C)-98.2 F (36.8 C)] 98.2 F (36.8 C) (06/07 0554) Pulse Rate:  [73-120] 110 (06/07 0554) Resp:  [9-23] 19 (06/07 0554) BP: (123-236)/(92-139) 127/92 mmHg (06/07 0554) SpO2:  [95 %-100 %] 98 % (06/07 0554) Weight:  [182 lb 8.7 oz (82.8 kg)] 182 lb 8.7 oz (82.8 kg) (06/07 0554) Last BM Date: 11/12/14 General:    Black female in NAD Heart:  Regular rate and rhythm Abdomen:  Soft, nondistended. Diffuse lower abdominal tenderness. A few bowel sounds Extremities:  Without edema. Neurologic:  Alert and oriented,  grossly normal neurologically. Psych:  Cooperative. Normal mood and affect.  Intake/Output from previous day: 06/06 0701 - 06/07 0700 In: 1797.5 [P.O.:120; I.V.:1677.5] Out: 1126 [Urine:1125; Stool:1] Intake/Output this shift:    Lab Results:  Recent Labs  11/11/14 0418  WBC 9.7  HGB 10.9*  HCT 34.0*  PLT 297   BMET  Recent Labs  11/11/14 0418  NA 138  K 4.7  CL 101  CO2 29  GLUCOSE 233*  BUN 8  CREATININE 0.45  CALCIUM 9.0    PT/INR  Recent Labs  11/11/14 0418 11/12/14 0355  LABPROT 16.4* 14.1  INR 1.31 1.07     Assessment / Plan:    71. 62. 55 year old female with multiple medical problems admitted with abdominal pain / inflammatory process involving jejunum on CT scan. Inflammatory process ignificantly improved on repeat CTscan 11/07/14 but due to lack of clinical improvement she underwent enteroscopy yesterday. No abnormalities of jejunum found (normal up to 40-50 cm). Multiple biopsies taken. Pain slightly better today though she took prn dose of morphine an hour ago. My first time seeing her but very tender in lower abdomen. Await biopsies  2. Candida esophagitis, continue Diflucan for 7 days, on day #2.    LOS: 10 days   Tye Savoy  11/12/2014, 9:22 AM

## 2014-11-13 LAB — PROTIME-INR
INR: 1.37 (ref 0.00–1.49)
Prothrombin Time: 16.9 seconds — ABNORMAL HIGH (ref 11.6–15.2)

## 2014-11-13 LAB — GLUCOSE, CAPILLARY
Glucose-Capillary: 194 mg/dL — ABNORMAL HIGH (ref 65–99)
Glucose-Capillary: 242 mg/dL — ABNORMAL HIGH (ref 65–99)
Glucose-Capillary: 157 mg/dL — ABNORMAL HIGH (ref 65–99)
Glucose-Capillary: 234 mg/dL — ABNORMAL HIGH (ref 65–99)

## 2014-11-13 MED ORDER — PREDNISONE 20 MG PO TABS
40.0000 mg | ORAL_TABLET | Freq: Every day | ORAL | Status: DC
  Administered 2014-11-13 – 2014-11-14 (×2): 40 mg via ORAL
  Filled 2014-11-13 (×2): qty 2

## 2014-11-13 MED ORDER — WARFARIN SODIUM 5 MG PO TABS
5.0000 mg | ORAL_TABLET | Freq: Once | ORAL | Status: AC
  Administered 2014-11-13: 5 mg via ORAL
  Filled 2014-11-13: qty 1

## 2014-11-13 NOTE — Progress Notes (Signed)
    Progress Note   Subjective  feels a little better today. It hurts but able to tolerate food   Objective   Vital signs in last 24 hours: Temp:  [98.2 F (36.8 C)-98.5 F (36.9 C)] 98.2 F (36.8 C) (06/08 0455) Pulse Rate:  [74-116] 74 (06/08 0455) Resp:  [18] 18 (06/08 0455) BP: (124-144)/(68-92) 136/82 mmHg (06/08 0455) SpO2:  [94 %-100 %] 94 % (06/08 0455) Weight:  [185 lb 10 oz (84.2 kg)] 185 lb 10 oz (84.2 kg) (06/08 0455) Last BM Date: 11/12/14 General:    Pleasant black female in NAD Heart:  Regular rate and rhythm Abdomen:  Soft, nondistended, significant diffuse mid to lower tendernss. Normal bowel sounds. Extremities:  Without edema. Neurologic:  Alert and oriented,  grossly normal neurologically. Psych:  Cooperative. Normal mood and affect.    Lab Results:  Recent Labs  11/11/14 0418  WBC 9.7  HGB 10.9*  HCT 34.0*  PLT 297   BMET  Recent Labs  11/11/14 0418  NA 138  K 4.7  CL 101  CO2 29  GLUCOSE 233*  BUN 8  CREATININE 0.45  CALCIUM 9.0   PT/INR  Recent Labs  11/12/14 0355 11/13/14 0430  LABPROT 14.1 16.9*  INR 1.07 1.37      Assessment / Plan:    70. 54 year old female with multiple medical problems admitted with abdominal pain / inflammatory process (vasculitis?) involving jejunum on CT scan. Inflammatory process significantly improved on repeat CTscan 11/07/14 but due to slow clinical improvement she underwent enteroscopy two days ago. Normal proximal jejunum (up to 40-50 cm). Multiple biopsies taken and path unremarkable. It is likely that inflammatory process was too distal for our scope to reach. Outpatient enterography may be helpful. Depending on clinical course she may need outpatient referral to [facility] for deeper enteroscopy. For now her pain is better. Will transition to PO steroids today (40mg ) with tapering plans at discharge.  2. Candida esophagitis, on day #3 of Diflucan. Should complete 7 days.    LOS: 11 days   Tye Savoy  11/13/2014, 9:34 AM      Attending physician's note   I have taken an interval history, reviewed the chart and examined the patient. I agree with the Advanced Practitioner's note, impression and recommendations. Slowly improving. Change to PO prednisone today. Consider CT enterography in several weeks as outpatient to reassess. Consider deep enteroscopy at [facility] or other tertiary center if problems persist.   Norberto Sorenson T. Fuller Plan, MD [facility]

## 2014-11-13 NOTE — Progress Notes (Signed)
Progress Note   Connie Ruiz YJW:[id number] DOB: 04-20-1961 DOA: 11/02/2014 PCP: Renato Shin, MD   Brief Narrative: From prior PN   LAYNIE Ruiz is an 54 y.o. female with complicated medical history including HTN, DM, lupus and antiphospholipid syndrome, s/p renal transplant requiring long term immunosuppressant, CVA in 2011, PE in 07/2010 and requiring long term AC with Coumadin, last known colonoscopy in [place] in 2002 which was reportedly unremarkable, admitted 11/02/14 with jejunitis being managed with Flagyl/Rocephin.  Hospital course has been complicated by persistent abd pain despite IV ABX regimen. Repeat CT abd and pelvis on 11/04/14 showed potentially worsening diverticulitis and GI team subsequently consulted for further assistance. 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 She subsequently underwent EGD 11/11/14 and was found to have a relatively normal-appearing small bowel. Biopsies are pending. Her diet has been advanced, and she can be discharged home when pathology back, as this is needed to determine if she needs further steroid treatment and INR therapeutic.  Assessment/Plan:   Principal Problem:  Acute abd pain secondary to jejunitis - Differential of etiology is infectious versus vasculitic secondary to lupus. ESR 65, ANA + 1:1280. - Repeat CT scan done 11/07/14 which showed improvement in bowel wall temporally related to steroid initiation. - Status post 7 days of therapy with Flagyl/Rocephin, continue Solu-Medrol. C. difficile PCR negative 2. - Currently on regular diet - Status post EGD 11/11/14 to evaluate proximal small bowel, described as grossly normal. Follow up pathology results.  Active Problems:   Candida esophagitis - Diflucan started 11/11/14. Will treat for one week from commencement of diflucan    Steroid induced  hyperglycemia - Currently being managed with SSI, moderate scale and 10 units of Lantus daily. CBGs [id number].  - Change SSI to resistant scale.   Essential hypertension -  stable Continue Cardizem and Cozaar.   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  - Pt is followed by Dr Annabelle Har[facility] at [facility] for [facility]ology.  - Prior workup showed negative dsDNA, SSA and SSB, negative Smith antibody, negative hepatitis profile. - Patient was ANA + 1:1280 with an abnormal lupus inhibitor. - Solu-Medrol started 11/06/14 due to concerns for vasculitis induced segmental jejunitis.   DVT (deep venous thrombosis) - Coumadin resumed 11/11/14 after INR reversed with vitamin K. - Lovenox bridging while patient is subtherapeutic   Anemia of chronic disease, lupus and IDA - FOBT negative. No evidence of active significant GI bleeding. - The patient is [name] and declines blood products for support. - Iron, B-12, folate WNL.   Depression - Continue Zoloft.    Hypokalemia / hypomagnesemia - Resolved with supplementation.   Supratherapeutic INR - Reversed with vitamin K. - Coumadin resumed on 11/11/14 per pharmacy.   Obesity  - Body mass index is 31.2 kg/(m^2).    DVT prophylaxis  - On chronic anticoagulation with Coumadin. INR subtherapeutic after being given vitamin K 11/10/14.  Code Status: Full.  Family Communication: No family at the bedside today. Disposition Plan: Home when diet successfully advanced, pathology report back, and INR therapeutic.    IV Access:    Peripheral IV   Procedures and diagnostic studies:   Ct Abdomen Pelvis W Contrast  11/07/2014   CLINICAL DATA:  Abdominal pain, inflammation of jejunal loops  EXAM: CT ABDOMEN AND PELVIS WITH CONTRAST  TECHNIQUE: Multidetector CT imaging of the abdomen and pelvis was performed using the standard protocol  following bolus administration of intravenous contrast.  CONTRAST:  121m OMNIPAQUE IOHEXOL 300 MG/ML  SOLN  COMPARISON:  11/04/2014  FINDINGS: Lung bases shows small right pleural effusion with right lower lobe posteriorly atelectasis.  There is skin thickening and stranding of subcutaneous fat with subcutaneous edema in right flank wall. Clinical correlation is necessary to exclude cellulitis.  There is small perihepatic ascites. The patient is status postcholecystectomy.  Atherosclerotic calcifications of abdominal aorta and iliac arteries again noted. Again noted atrophic native kidneys. Normal appearing transplanted kidney in right lower quadrant.  There is significant improvement in the segmental inflammatory changes in jejunum. Only residual mild gaseous distended segmental jejunal loop at the site of previous inflammation. Minimal residual thickened jejunal wall with improvement from prior exam. Improvement in surrounding mesenteric inflammatory changes.  The pancreas, spleen and adrenal glands are unremarkable.  There is residual mild distension of the cecum with gas with improvement from prior exam. The cecum measures only 2.7 cm in diameter. Again the cecum is located in mid anterior abdomen suspicious for mobile cecum. There is no thickening of cecal wall. No evidence of cecal volvulus.  Abdominal wall scarring is noted in right lower quadrant. The uterus and adnexa are unremarkable. The urinary bladder is unremarkable. Fifth again noted descending colon and sigmoid colon diverticula. No evidence of acute diverticulitis.  IMPRESSION: 1. Significant improvement in segmental inflammatory changes of jejunum. Only residual mild segmental gaseous dilatation of jejunum. Minimal residual wall thickening with improvement from prior exam. Improvement in adjacent mesenteric stranding. 2. Residual mild gaseous distended cecum with improvement from prior exam. The cecum measures 2.7 cm in diameter. No evidence of  cecal volvulus or thickening of cecal wall. 3. There is skin thickening and stranding of subcutaneous fat and subcutaneous edema in right flank wall. Cellulitis or subcutaneous edema cannot be excluded. Clinical correlation is necessary 4. Normal appearing transplanted kidney. Again noted atrophic native kidneys. No hydronephrosis. 5. No small bowel or colonic obstruction.   Electronically Signed   By: LLahoma CrockerM.D.   On: 11/07/2014 12:30   Ct Abdomen Pelvis W Contrast  11/04/2014   CLINICAL DATA:  Umbilical pain, abdominal pain, diarrhea  EXAM: CT ABDOMEN AND PELVIS WITH CONTRAST  TECHNIQUE: Multidetector CT imaging of the abdomen and pelvis was performed using the standard protocol following bolus administration of intravenous contrast.  CONTRAST:  1026mOMNIPAQUE IOHEXOL 300 MG/ML  SOLN  COMPARISON:  5/28/ 16  FINDINGS: There is small right pleural effusion with right lower lobe posterior atelectasis. Again noted perihepatic ascites increased in size from prior exam. Postcholecystectomy surgical clips are noted. There is contrast material in distal esophagus probable from gastroesophageal reflux.  There is no evidence of gastric outlet obstruction.  No evidence of small bowel obstruction. Again noted significant segmental thickening of jejunal wall in left lower quadrant best seen in axial image 54. Mild worsening from prior exam. Wall thickness measures at least 1.1 cm. Again findings consistent with segmental enteritis probable infectious. Atherosclerotic calcifications of abdominal aorta and bilateral iliac artery again noted. Atherosclerotic calcifications bilateral renal artery origin. Atherosclerotic calcifications of celiac trunk and SMA origin.  There is again noted mild stranding of mesenteric fat adjacent to inflamed segment of small bowel in left abdomen. Small mesenteric lymph nodes are noted at this level probable reactive.  The native kidneys are small in size  atrophic. Stable lesion seen  native kidneys. Again noted normal function an appearance of transplanted kidney in right lower quadrant.  There is significant gaseous distension of the cecum in left mid abdomen anteriorly. The cecum measures at least 6 cm in diameter suspicious for segmental sickle ileus. There is no evidence of small bowel obstruction. Contrast material is noted in right colon and transverse colon. 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 Small amount of ascites is noted in right paracolic gutter. Normal appendix is partially visualized in axial image 59. There is small amount of pelvic ascites in right lower pelvis. The uterus and adnexa are unremarkable. No distal colonic obstruction. Few colonic diverticula are noted in left colon. There is contrast material in left colon and sigmoid colon.  Delayed renal images shows normal excretion of transplanted kidney.  Sagittal images of the spine are unremarkable. The pancreas, spleen and adrenal glands are unremarkable. No focal hepatic mass. Status postcholecystectomy.  IMPRESSION: 1. There is slight worsening in segmental inflammatory changes in jejunum in left abdomen. Slight worsening of thickening of the jejunal wall up to 1.1 cm. Again findings consistent with segmental enteritis. This may be infectious in nature or due to lupus enteritis 2. Mild increased perihepatic ascites. 3. There is small right pleural effusion with right lower lobe posterior atelectasis. 4. There is significant gaseous distension of the cecum. Measures up to 6 cm. There is mild angulation and anterior position of the cecum . Findings suspicious for mobile cecum. There is no thickening of cecal wall to suggest cecal volvulus. There is no evidence of obstruction. Cecal ileus cannot be excluded. 5. Normal appendix. No colonic obstruction. Contrast material is noted throughout the colon. 6. Small amount of pelvic  ascites. 7. Normal function and appearance of transplanted kidney. Again noted atrophic native kidneys.   Electronically Signed   By: Lahoma Crocker M.D.   On: 11/04/2014 17:02   Ct Abdomen Pelvis W Contrast  11/02/2014   CLINICAL DATA:  Acute abdominal pain  EXAM: CT ABDOMEN AND PELVIS WITH CONTRAST  TECHNIQUE: Multidetector CT imaging of the abdomen and pelvis was performed using the standard protocol following bolus administration of intravenous contrast.  CONTRAST:  171m OMNIPAQUE IOHEXOL 300 MG/ML  SOLN  COMPARISON:  06/28/2014  FINDINGS: BODY WALL: No contributory findings.  LOWER CHEST: Extensive coronary atherosclerosis for age  ABDOMEN/PELVIS:  Liver: No focal abnormality.  Biliary: Cholecystectomy with chronic intra and extrahepatic biliary duct enlargement  Pancreas: Unremarkable.  Spleen: Single coarse calcification, likely granulomatous.  Adrenals: Unremarkable.  Kidneys and ureters: There is a transplant kidney in the right lower quadrant, already excreting contrast. Would always consult with transplant surgeon prior to administering IV contrast. Severe native renal atrophy with stable low-density renal lesions which are indeterminate due to soft tissue density, but size stable since 2013, 22 mm on the left and 16 mm on the right.  Bladder: Unremarkable.  Reproductive: No pathologic findings.  Bowel: There is severe wall thickening of a jejunum segment with low-density submucosal appearance consistent with edema. The mesentery is diffusely edematous. The associated vessels appear widely patent. There is no evidence of perforation or abscess. No bowel obstruction. Distal colonic diverticulosis. Normal appendix.  Retroperitoneum: No mass or adenopathy.  Peritoneum: Small ascites around the liver and in the pelvis, considered reactive.  Vascular: No acute abnormality.  OSSEOUS: No acute abnormalities.  IMPRESSION: 1. Severe short-segment jejunitis, likely infectious or lupus  enteritis (given patient's  history). 2. Bilateral native renal lesions are indeterminate due to density, but size stable from 2013. 3. Additional chronic findings are stable from prior noted above.   Electronically Signed   By: Monte Fantasia M.D.   On: 11/02/2014 04:41     Medical Consultants:    Gastroenterology  Anti-Infectives:    Rocephin 5/28 -->11/09/14  Flagyl 5/28 -->11/09/14  Subjective:   Madelin Headings has no new complaints. Still complaining of abdominal discomfort.  Objective:    Filed Vitals:   11/12/14 1524 11/12/14 2049 11/13/14 0455 11/13/14 1350  BP: 124/90 144/92 136/82 140/75  Pulse: 89 92 74 71  Temp: 98.5 F (36.9 C) 98.2 F (36.8 C) 98.2 F (36.8 C) 97.9 F (36.6 C)  TempSrc: Oral Oral Oral Oral  Resp: '18  18 16  ' Height:      Weight:   84.2 kg (185 lb 10 oz)   SpO2: 100% 99% 94% 98%    Intake/Output Summary (Last 24 hours) at 11/13/14 1357 Last data filed at 11/13/14 1005  Gross per 24 hour  Intake   2280 ml  Output    600 ml  Net   1680 ml    Exam: Gen:  NAD, affect brighter Cardiovascular:  RRR, No M/R/G Respiratory:  Lungs CTAB, diminished in the bases. Gastrointestinal:  No guarding, limited exam due to patient's preference for me not to evaluate abdomen. Extremities:  No C/E/C   Data Reviewed:    Labs: Basic Metabolic Panel:  Recent Labs Lab 11/07/14 0440  11/07/14 0507 11/08/14 0433 11/09/14 0449 11/11/14 0418  NA  --   --  139 137 138 138  K  --   < > 3.3* 3.4* 4.0 4.7  CL  --   --  105 105 107 101  CO2  --   --  18* '25 27 29  ' GLUCOSE  --   --  112* 230* 248* 233*  BUN  --   --  '10 14 10 8  ' CREATININE  --   --  0.64 0.50 0.50 0.45  CALCIUM  --   --  8.2* 8.5* 8.6* 9.0  MG 1.7  --   --   --   --   --   < > = values in this interval not displayed. GFR Estimated Creatinine Clearance: 84.4 mL/min (by C-G formula based on Cr of 0.45). Liver Function Tests: No results for input(s): AST, ALT, ALKPHOS, BILITOT, PROT, ALBUMIN in the last 168  hours. No results for input(s): LIPASE, AMYLASE in the last 168 hours. Coagulation profile  Recent Labs Lab 11/09/14 0449 11/10/14 0500 11/11/14 0418 11/12/14 0355 11/13/14 0430  INR 3.53* 4.17* 1.31 1.07 1.37    CBC:  Recent Labs Lab 11/07/14 0507 11/08/14 0433 11/11/14 0418  WBC 5.9 7.1 9.7  HGB 8.7* 8.9* 10.9*  HCT 27.1* 27.4* 34.0*  MCV 91.9 89.0 89.9  PLT 267 312 297   CBG (last 3)   Recent Labs  11/12/14 2149 11/13/14 0804 11/13/14 1159  GLUCAP 211* 194* 242*     Sepsis Labs:  Recent Labs Lab 11/07/14 0507 11/08/14 0433 11/11/14 0418  WBC 5.9 7.1 9.7   Microbiology Recent Results (from the past 240 hour(s))  Clostridium Difficile by PCR     Status: None   Collection Time: 11/05/14  3:18 AM  Result Value Ref Range Status   C difficile by pcr NEGATIVE NEGATIVE Final  Clostridium Difficile by PCR  Status: None   Collection Time: 11/08/14  8:44 PM  Result Value Ref Range Status   C difficile by pcr NEGATIVE NEGATIVE Final     Medications:   . atorvastatin  40 mg Oral q1800  . calcitRIOL  0.25 mcg Oral Daily  . darifenacin  7.5 mg Oral Daily  . diltiazem  240 mg Oral Daily  . donepezil  5 mg Oral QHS  . enoxaparin (LOVENOX) injection  80 mg Subcutaneous Q12H  . fluconazole  100 mg Oral Daily  . folic acid  1 mg Oral Daily  . insulin aspart  0-20 Units Subcutaneous TID WC  . insulin aspart  0-5 Units Subcutaneous QHS  . insulin glargine  10 Units Subcutaneous Daily  . losartan  100 mg Oral Daily  . mycophenolate  250 mg Oral BID  . pantoprazole (PROTONIX) IV  40 mg Intravenous Q12H  . potassium chloride  40 mEq Oral BID  . predniSONE  40 mg Oral Q breakfast  . sertraline  100 mg Oral Daily  . sodium chloride  3 mL Intravenous Q12H  . tacrolimus  1 mg Oral BID  . warfarin  5 mg Oral ONCE-1800  . Warfarin - Pharmacist Dosing Inpatient   Does not apply q1800   Continuous Infusions: . sodium chloride 75 mL/hr at 11/13/14 1021     Time spent: 25 minutes.   LOS: 11 days   Velvet Bathe  [facility] Pager [phone]  *Please refer to [email or web address], password [email or web address] to get updated schedule on who will round on this patient, as hospitalists switch teams weekly. If 7PM-7AM, please contact night-coverage at [email or web address], password [email or web address] for any overnight needs.  11/13/2014, 1:57 PM

## 2014-11-13 NOTE — Progress Notes (Signed)
Physical Therapy Treatment Patient Details Name: Connie Ruiz MRN: [id number] DOB: [birth year] Today's Date: 11/13/2014    History of Present Illness Patient is a 54 y/o female admitted with abdominal pain, positive for jejunitis.  PMH positive for antiphospholipid syndrome, renal transplant, CVA with R residual deficits, DVT w/ PE, DM, lupus.    PT Comments    Pt progressing slowly medically.  Assisted OOB to amb in hallway a greater distance.  Assisted to [facility] then back to bed as pt was in recliner earlier today.   Follow Up Recommendations  SNF (pt declines rec for SNF so will need 24/7 assist at home)     Equipment Recommendations  None recommended by PT    Recommendations for Other Services       Precautions / Restrictions Precautions Precautions: Fall Precaution Comments: history of falls at home Restrictions Weight Bearing Restrictions: No    Mobility  Bed Mobility Overal bed mobility: Needs Assistance Bed Mobility: Supine to Sit;Sit to Supine     Supine to sit: Mod assist Sit to supine: Mod assist   General bed mobility comments: assist for LEs due to ABD pain and increased time to scoot.  Utilized bed pad to complete.   Transfers Overall transfer level: Needs assistance Equipment used: Rolling walker (2 wheeled) Transfers: Sit to/from Stand Sit to Stand: Min assist         General transfer comment: verbal cues for technique, assist to rise.  also assissted on/off BSC.  Ambulation/Gait Ambulation/Gait assistance: Min assist;Mod assist Ambulation Distance (Feet): 48 Feet Assistive device: Rolling walker (2 wheeled) Gait Pattern/deviations: Step-to pattern;Step-through pattern;Trunk flexed Gait velocity: decreased   General Gait Details: pt reports feeling very fatigued, required  5 standing rest breaks.  c/o B LE weakness R>L.   Stairs            Wheelchair Mobility    Modified Rankin (Stroke Patients Only)       Balance                                     Cognition Arousal/Alertness: Awake/alert Behavior During Therapy: Flat affect;WFL for tasks assessed/performed Overall Cognitive Status: Within Functional Limits for tasks assessed                      Exercises      General Comments        Pertinent Vitals/Pain Pain Assessment: Faces Faces Pain Scale: Hurts even more Pain Location: low ABD Pain Descriptors / Indicators: Sharp Pain Intervention(s): Monitored during session;Repositioned    Home Living                      Prior Function            PT Goals (current goals can now be found in the care plan section) Progress towards PT goals: Progressing toward goals    Frequency  Min 3X/week    PT Plan      Co-evaluation             End of Session Equipment Utilized During Treatment: Gait belt Activity Tolerance: Patient limited by pain;Patient limited by fatigue Patient left: in bed;with call bell/phone within reach;with bed alarm set     Time: 1410-1435 PT Time Calculation (min) (ACUTE ONLY): 25 min  Charges:  $Gait Training: 8-22 mins $Therapeutic Activity: 8-22 mins  G Codes:      Rica Koyanagi  PTA [facility]  Acute  Rehab Pager      [phone]

## 2014-11-13 NOTE — Progress Notes (Signed)
ANTICOAGULATION CONSULT NOTE - Follow Up Consult  Pharmacy Consult for warfarin/enoxaparin Indication:hx PE, DVT and stroke  Allergies  Allergen Reactions  . Oxycodone-Acetaminophen Shortness Of Breath and Nausea Only  . Propoxyphene N-Acetaminophen Shortness Of Breath and Nausea Only  . Sulfonamide Derivatives Shortness Of Breath and Nausea Only  . Codeine Nausea Only  . Latex Rash  . Metoprolol Rash   Patient Measurements: Height: 5\' 4"  (162.6 cm) Weight: 185 lb 10 oz (84.2 kg) IBW/kg (Calculated) : 54.7  Vital Signs: Temp: 98.2 F (36.8 C) (06/08 0455) Temp Source: Oral (06/08 0455) BP: 136/82 mmHg (06/08 0455) Pulse Rate: 74 (06/08 0455)  Labs:  Recent Labs  11/11/14 0418 11/12/14 0355 11/13/14 0430  HGB 10.9*  --   --   HCT 34.0*  --   --   PLT 297  --   --   LABPROT 16.4* 14.1 16.9*  INR 1.31 1.07 1.37  CREATININE 0.45  --   --    Estimated Creatinine Clearance: 84.4 mL/min (by C-G formula based on Cr of 0.45).  Medications:  Scheduled:  . atorvastatin  40 mg Oral q1800  . calcitRIOL  0.25 mcg Oral Daily  . darifenacin  7.5 mg Oral Daily  . diltiazem  240 mg Oral Daily  . donepezil  5 mg Oral QHS  . enoxaparin (LOVENOX) injection  80 mg Subcutaneous Q12H  . fluconazole  100 mg Oral Daily  . folic acid  1 mg Oral Daily  . insulin aspart  0-20 Units Subcutaneous TID WC  . insulin aspart  0-5 Units Subcutaneous QHS  . insulin glargine  10 Units Subcutaneous Daily  . losartan  100 mg Oral Daily  . methylPREDNISolone (SOLU-MEDROL) injection  60 mg Intravenous Q12H  . mycophenolate  250 mg Oral BID  . pantoprazole (PROTONIX) IV  40 mg Intravenous Q12H  . potassium chloride  40 mEq Oral BID  . sertraline  100 mg Oral Daily  . sodium chloride  3 mL Intravenous Q12H  . tacrolimus  1 mg Oral BID  . Warfarin - Pharmacist Dosing Inpatient   Does not apply q1800   Assessment:  61 yoF admitted with c/o diffuse abd pain, N/V. Hx of PE on chronic Warfarin, home  dose 7.5 mg daily with last dose 5/27. Jejunitis on CT, treating with Cipro/Flagyl. Other hx: Lupus, Renal transplant on immunomodulators. INR > 10 on admission, treated with IV/PO Vit K + FFP.  Warfarin resumed 5/29 with INR 1.58.  Enoxaparin initiated on 6/7 for bridging until INR therapeutic.  Significant Events: -5/28: 5mg  vit K IV & PO -6/1: 5mg  vit k po -6/5: 5mg  vit k IV  Today 11/13/2014  INR =1.37 subtherapeutic   Hgb low but stable, plt ok, CrCl >16mls/min  Diet advanced  No bleeding documented  Pt on diflucan:may decrease hepatic metabolism and increase the hypoprothrombinemic effect of Anticoagulants.   Goal of Therapy:  INR 2-3   Plan:   Warfarin 5 mg po today @ 1800  lovenox 80mg  SQ q12h  Daily INR  Monitor for signs of bleeding or thrombosis  Recommend bridging for 5 days and until 2 consecutive therapeutic INR readings obtained  Thank you for the consult,   Dolly Rias RPh 11/13/2014, 8:05 AM Pager [phone]

## 2014-11-14 DIAGNOSIS — F0390 Unspecified dementia without behavioral disturbance: Secondary | ICD-10-CM | POA: Diagnosis not present

## 2014-11-14 DIAGNOSIS — E1165 Type 2 diabetes mellitus with hyperglycemia: Secondary | ICD-10-CM | POA: Diagnosis not present

## 2014-11-14 DIAGNOSIS — K2970 Gastritis, unspecified, without bleeding: Secondary | ICD-10-CM | POA: Diagnosis not present

## 2014-11-14 DIAGNOSIS — R110 Nausea: Secondary | ICD-10-CM | POA: Diagnosis not present

## 2014-11-14 DIAGNOSIS — D509 Iron deficiency anemia, unspecified: Secondary | ICD-10-CM | POA: Diagnosis not present

## 2014-11-14 DIAGNOSIS — I10 Essential (primary) hypertension: Secondary | ICD-10-CM | POA: Diagnosis not present

## 2014-11-14 DIAGNOSIS — R946 Abnormal results of thyroid function studies: Secondary | ICD-10-CM | POA: Diagnosis not present

## 2014-11-14 DIAGNOSIS — B3781 Candidal esophagitis: Secondary | ICD-10-CM | POA: Diagnosis not present

## 2014-11-14 DIAGNOSIS — E440 Moderate protein-calorie malnutrition: Secondary | ICD-10-CM | POA: Diagnosis not present

## 2014-11-14 DIAGNOSIS — Z940 Kidney transplant status: Secondary | ICD-10-CM | POA: Diagnosis not present

## 2014-11-14 DIAGNOSIS — D638 Anemia in other chronic diseases classified elsewhere: Secondary | ICD-10-CM | POA: Diagnosis not present

## 2014-11-14 DIAGNOSIS — R935 Abnormal findings on diagnostic imaging of other abdominal regions, including retroperitoneum: Secondary | ICD-10-CM | POA: Diagnosis not present

## 2014-11-14 DIAGNOSIS — Z9889 Other specified postprocedural states: Secondary | ICD-10-CM | POA: Diagnosis not present

## 2014-11-14 DIAGNOSIS — N3941 Urge incontinence: Secondary | ICD-10-CM | POA: Diagnosis not present

## 2014-11-14 DIAGNOSIS — Z9049 Acquired absence of other specified parts of digestive tract: Secondary | ICD-10-CM | POA: Diagnosis not present

## 2014-11-14 DIAGNOSIS — M810 Age-related osteoporosis without current pathological fracture: Secondary | ICD-10-CM | POA: Diagnosis not present

## 2014-11-14 DIAGNOSIS — F329 Major depressive disorder, single episode, unspecified: Secondary | ICD-10-CM | POA: Diagnosis not present

## 2014-11-14 DIAGNOSIS — I69351 Hemiplegia and hemiparesis following cerebral infarction affecting right dominant side: Secondary | ICD-10-CM | POA: Diagnosis not present

## 2014-11-14 DIAGNOSIS — N289 Disorder of kidney and ureter, unspecified: Secondary | ICD-10-CM | POA: Diagnosis not present

## 2014-11-14 DIAGNOSIS — K5289 Other specified noninfective gastroenteritis and colitis: Secondary | ICD-10-CM | POA: Diagnosis not present

## 2014-11-14 DIAGNOSIS — T380X5A Adverse effect of glucocorticoids and synthetic analogues, initial encounter: Secondary | ICD-10-CM | POA: Diagnosis not present

## 2014-11-14 DIAGNOSIS — K529 Noninfective gastroenteritis and colitis, unspecified: Secondary | ICD-10-CM | POA: Diagnosis not present

## 2014-11-14 DIAGNOSIS — R1012 Left upper quadrant pain: Secondary | ICD-10-CM | POA: Diagnosis not present

## 2014-11-14 DIAGNOSIS — M329 Systemic lupus erythematosus, unspecified: Secondary | ICD-10-CM | POA: Diagnosis not present

## 2014-11-14 DIAGNOSIS — R109 Unspecified abdominal pain: Secondary | ICD-10-CM | POA: Diagnosis not present

## 2014-11-14 DIAGNOSIS — R739 Hyperglycemia, unspecified: Secondary | ICD-10-CM | POA: Diagnosis not present

## 2014-11-14 DIAGNOSIS — E669 Obesity, unspecified: Secondary | ICD-10-CM | POA: Diagnosis not present

## 2014-11-14 DIAGNOSIS — E785 Hyperlipidemia, unspecified: Secondary | ICD-10-CM | POA: Diagnosis not present

## 2014-11-14 DIAGNOSIS — I82509 Chronic embolism and thrombosis of unspecified deep veins of unspecified lower extremity: Secondary | ICD-10-CM | POA: Diagnosis not present

## 2014-11-14 DIAGNOSIS — R197 Diarrhea, unspecified: Secondary | ICD-10-CM | POA: Diagnosis not present

## 2014-11-14 DIAGNOSIS — Z7952 Long term (current) use of systemic steroids: Secondary | ICD-10-CM | POA: Diagnosis not present

## 2014-11-14 DIAGNOSIS — R791 Abnormal coagulation profile: Secondary | ICD-10-CM | POA: Diagnosis not present

## 2014-11-14 DIAGNOSIS — E1151 Type 2 diabetes mellitus with diabetic peripheral angiopathy without gangrene: Secondary | ICD-10-CM | POA: Diagnosis not present

## 2014-11-14 DIAGNOSIS — L299 Pruritus, unspecified: Secondary | ICD-10-CM | POA: Diagnosis not present

## 2014-11-14 DIAGNOSIS — Z6832 Body mass index (BMI) 32.0-32.9, adult: Secondary | ICD-10-CM | POA: Diagnosis not present

## 2014-11-14 DIAGNOSIS — R1032 Left lower quadrant pain: Secondary | ICD-10-CM | POA: Diagnosis not present

## 2014-11-14 DIAGNOSIS — I6350 Cerebral infarction due to unspecified occlusion or stenosis of unspecified cerebral artery: Secondary | ICD-10-CM | POA: Diagnosis not present

## 2014-11-14 DIAGNOSIS — E876 Hypokalemia: Secondary | ICD-10-CM | POA: Diagnosis not present

## 2014-11-14 LAB — GLUCOSE, CAPILLARY
Glucose-Capillary: 151 mg/dL — ABNORMAL HIGH (ref 65–99)
Glucose-Capillary: 224 mg/dL — ABNORMAL HIGH (ref 65–99)

## 2014-11-14 LAB — PROTIME-INR
INR: 2.48 — ABNORMAL HIGH (ref 0.00–1.49)
Prothrombin Time: 26.6 seconds — ABNORMAL HIGH (ref 11.6–15.2)

## 2014-11-14 MED ORDER — INSULIN GLARGINE 100 UNIT/ML ~~LOC~~ SOLN: 10.0000 [IU] | Freq: Every day | SUBCUTANEOUS | Status: DC

## 2014-11-14 MED ORDER — WARFARIN SODIUM 2.5 MG PO TABS
2.5000 mg | ORAL_TABLET | Freq: Once | ORAL | Status: DC
  Filled 2014-11-14: qty 1

## 2014-11-14 MED ORDER — WARFARIN SODIUM 2.5 MG PO TABS: 2.5000 mg | ORAL_TABLET | Freq: Every day | ORAL | Status: DC

## 2014-11-14 MED ORDER — FLUCONAZOLE 100 MG PO TABS: 100.0000 mg | ORAL_TABLET | Freq: Every day | ORAL | Status: AC

## 2014-11-14 MED ORDER — MORPHINE SULFATE 15 MG PO TABS: 15.0000 mg | ORAL_TABLET | ORAL | Status: DC | PRN

## 2014-11-14 MED ORDER — PREDNISONE 20 MG PO TABS: 20.0000 mg | ORAL_TABLET | Freq: Every day | ORAL | Status: DC

## 2014-11-14 NOTE — Care Management Note (Signed)
Case Management Note  Patient Details  Name: Connie Ruiz MRN: [id number] Date of Birth: 1960-07-17  Subjective/Objective:   Patient agreeable to SNF.                  Action/Plan:d/c SNF.   Expected Discharge Date:               Expected Discharge Plan:  [place] (declining SNF. Wants home w/HHC.Will await choice.)  In-House Referral:  Clinical Social Work  Discharge planning Services  CM Consult  Post Acute Care Choice:    Choice offered to:  Patient  DME Arranged:    DME Agency:     HH Arranged:    [place] Agency:     Status of Service:  Completed, signed off  Medicare Important Message Given:  Yes Date Medicare IM Given:  11/05/14 Medicare IM give by:  Dessa Phi Date Additional Medicare IM Given:  11/14/14 Additional Medicare Important Message give by:  Dessa Phi  If discussed at Long Length of Stay Meetings, dates discussed:  11/14/14  Additional Comments:  Dessa Phi, RN 11/14/2014, 11:35 AM

## 2014-11-14 NOTE — Progress Notes (Signed)
CSW received call from daughter, who is at surgical check in desk. Per daughter, pt is unaware that pt family planning to move her to assisted living. However she feels patient nees to go to snf first and understands pt is refusing. CSW provided, pt's CSW daughter's information, Henslee Allton [phone].   Belia Heman, [place] Work  [employer] [phone]

## 2014-11-14 NOTE — Progress Notes (Signed)
ANTICOAGULATION CONSULT NOTE - Follow Up Consult  Pharmacy Consult for warfarin/enoxaparin Indication:hx PE, DVT and stroke  Allergies  Allergen Reactions  . Oxycodone-Acetaminophen Shortness Of Breath and Nausea Only  . Propoxyphene N-Acetaminophen Shortness Of Breath and Nausea Only  . Sulfonamide Derivatives Shortness Of Breath and Nausea Only  . Codeine Nausea Only  . Latex Rash  . Metoprolol Rash   Patient Measurements: Height: 5\' 4"  (162.6 cm) Weight: 185 lb 10 oz (84.2 kg) IBW/kg (Calculated) : 54.7  Vital Signs: Temp: 98.4 F (36.9 C) (06/08 2108) Temp Source: Oral (06/08 2108) BP: 133/71 mmHg (06/08 2108) Pulse Rate: 78 (06/08 2108)  Labs:  Recent Labs  11/12/14 0355 11/13/14 0430 11/14/14 0429  LABPROT 14.1 16.9* 26.6*  INR 1.07 1.37 2.48*   Estimated Creatinine Clearance: 84.4 mL/min (by C-G formula based on Cr of 0.45).  Medications:  Scheduled:  . atorvastatin  40 mg Oral q1800  . calcitRIOL  0.25 mcg Oral Daily  . darifenacin  7.5 mg Oral Daily  . diltiazem  240 mg Oral Daily  . donepezil  5 mg Oral QHS  . enoxaparin (LOVENOX) injection  80 mg Subcutaneous Q12H  . fluconazole  100 mg Oral Daily  . folic acid  1 mg Oral Daily  . insulin aspart  0-20 Units Subcutaneous TID WC  . insulin aspart  0-5 Units Subcutaneous QHS  . insulin glargine  10 Units Subcutaneous Daily  . losartan  100 mg Oral Daily  . mycophenolate  250 mg Oral BID  . pantoprazole (PROTONIX) IV  40 mg Intravenous Q12H  . potassium chloride  40 mEq Oral BID  . predniSONE  40 mg Oral Q breakfast  . sertraline  100 mg Oral Daily  . sodium chloride  3 mL Intravenous Q12H  . tacrolimus  1 mg Oral BID  . Warfarin - Pharmacist Dosing Inpatient   Does not apply q1800   Assessment:  65 yoF admitted with c/o diffuse abd pain, N/V. 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 Jejunitis on CT, treating with Cipro/Flagyl. Other hx: Lupus, Renal transplant on  immunomodulators. INR > 10 on admission, treated with IV/PO Vit K + FFP.  Warfarin resumed 5/29 with INR 1.58.  Enoxaparin initiated on 6/7 for bridging until INR therapeutic.  Significant Events: -5/28: 5mg  vit K IV & PO -6/1: 5mg  vit k po -6/5: 5mg  vit k IV  Today 11/14/2014  INR = 2.48, therapeutic  Hgb low but stable, plt ok, CrCl >8mls/min  Diet advanced  No bleeding documented  Pt on diflucan:may decrease hepatic metabolism and increase the hypoprothrombinemic effect of Anticoagulants.   Goal of Therapy:  INR 2-3   Plan:   Warfarin 2.5 mg po today @ 1800  lovenox 80mg  SQ q12h  Daily INR  Monitor for signs of bleeding or thrombosis  If discharge today -due to the effects of diflucan-recommend patient continue on 2.5mg  warfarin daily with INR follow up on Monday 6/13.  Thank you for the consult,   Dolly Rias RPh 11/14/2014, 8:39 AM Pager [phone]

## 2014-11-14 NOTE — Clinical Social Work Placement (Signed)
CSW spoke with patient's daughter, [name] at bedside - patient understands that she is not safe to go back home alone at this time & is now agreeable with plan for SNF. Patient's daughter informed CSW that she works at [employer] and is requesting there. CSW awaiting call back from [place] at [facility] re: bed availability.    CLINICAL SOCIAL WORK PLACEMENT  NOTE  Date:  11/14/2014  Patient Details  Name: ELISHEVA DESPOSITO MRN: [id number] Date of Birth: [birth year]  Clinical Social Work is seeking post-discharge placement for this patient at the [place] level of care (*CSW will initial, date and re-position this form in  chart as items are completed):  Yes   Patient/family provided with [facility] Work Department's list of facilities offering this level of care within the geographic area requested by the patient (or if unable, by the patient's family).  Yes   Patient/family informed of their freedom to choose among providers that offer the needed level of care, that participate in Medicare, Medicaid or managed care program needed by the patient, have an available bed and are willing to accept the patient.  Yes   Patient/family informed of [facility]'s ownership interest in [facility] and [facility], as well as of the fact that they are under no obligation to receive care at these facilities.  PASRR submitted to EDS on 11/14/14     PASRR number received on 11/14/14     Existing PASRR number confirmed on       FL2 transmitted to all facilities in geographic area requested by pt/family on 11/14/14     FL2 transmitted to all facilities within larger geographic area on       Patient informed that his/her managed care company has contracts with or will negotiate with certain facilities, including the following:        Yes   Patient/family informed of bed offers received.  Patient chooses bed at       Physician recommends and patient  chooses bed at      Patient to be transferred to   on  .  Patient to be transferred to facility by       Patient family notified on   of transfer.  Name of family member notified:        PHYSICIAN       Additional Comment:    _______________________________________________ Standley Brooking, LCSW 11/14/2014, 11:41 AM

## 2014-11-14 NOTE — Discharge Summary (Signed)
Physician Discharge Summary  MARGUERETTE SHELLER VWU:[id number] DOB: [birth year] DOA: 11/02/2014  PCP: Renato Shin, MD  Admit date: 11/02/2014 Discharge date: 11/14/2014  Time spent: > 35 minutes  Recommendations for Outpatient Follow-up:  1. Recommend patient follow-up with [facility] so that further planning can be made regarding further GI workup.  Discharge Diagnoses:  Principal Problem:   Jejunitis Active Problems:   Essential hypertension   Cerebral artery occlusion with cerebral infarction   LUPUS   KIDNEY TRANSPLANTATION, HX OF   DVT (deep venous thrombosis)   Depression   History of stroke with residual effects   Supratherapeutic INR   Hypomagnesemia   Abdominal pain, acute   Steroid-induced hyperglycemia   Abnormal CT of the abdomen   Candida esophagitis   Discharge Condition: Stable  Diet recommendation: Heart healthy  Filed Weights   11/11/14 0630 11/12/14 0554 11/13/14 0455  Weight: 87.6 kg (193 lb 2 oz) 82.8 kg (182 lb 8.7 oz) 84.2 kg (185 lb 10 oz)    History of present illness:  Connie Ruiz is an 54 y.o. female with complicated medical history including HTN, DM, lupus and antiphospholipid syndrome, s/p renal transplant requiring long term immunosuppressant, CVA in 2011, PE in 07/2010 and requiring long term AC with Coumadin, last known colonoscopy in [place] in 2002 which was reportedly unremarkable, admitted 11/02/14 with jejunitis being managed with Flagyl/Rocephin. Hospital course has been complicated by persistent abd pain despite IV ABX regimen. Repeat CT abd and pelvis on 11/04/14 showed potentially worsening diverticulitis and GI team subsequently consulted for further assistance. 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 She subsequently underwent EGD 11/11/14 and was found to have a relatively normal-appearing small  bowel. Biopsies are pending. Her diet has been advanced, and she can be discharged home when pathology back, as this is needed to determine if she needs further steroid treatment and INR therapeutic.  Hospital Course:  Principal Problem:  Acute abd pain secondary to jejunitis - Differential of etiology is infectious versus vasculitic secondary to lupus. ESR 65, ANA + 1:1280. - Repeat CT scan done 11/07/14 which showed improvement in bowel wall temporally related to steroid initiation. - Status post 7 days of therapy with Flagyl/Rocephin, continue prednisone on d/c with taper back to home regimen. C. difficile PCR negative 2. - Currently on regular diet - Status post EGD 11/11/14 to evaluate proximal small bowel, described as grossly normal. Follow up pathology results reviewed with no reports of active inflammation - Condition improving and currently tolerating oral diet and pain ameliorating. As such will discharge with plans for patient to follow-up at [facility] for further evaluations per her preference.This has been discussed with GI team who is in agreement currently with last conversation at 1310 on 11/14/14  Active Problems:   Candida esophagitis - Diflucan started 11/11/14. Will treat for one week from commencement of diflucan   Steroid induced hyperglycemia - Currently being managed with SSI, moderate scale and 10 units of Lantus daily. CBGs [id number].  - Change SSI to resistant scale.   Essential hypertension - stable Continue Cardizem and Cozaar.   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 Prednisone will be tapered back to home regimen - Pt is followed by Dr Annabelle Harman at [facility] for rheumatology.  - Prior workup showed negative dsDNA, SSA and SSB, negative  Smith antibody, negative hepatitis profile. - Patient was ANA + 1:1280 with an abnormal lupus inhibitor. - Solu-Medrol started 11/06/14 due to  concerns for vasculitis induced segmental jejunitis. Biopsy negative for active inflammatory course. Patient currently tolerating regular diet and currently seems comfortable on current pain medication. At this juncture does not need or require further hospitalization and can be evaluated as an outpatient at [facility] for further recommendations as per her desire.    DVT (deep venous thrombosis) - Coumadin to be resumed on discharge. Patient will require further INR monitoring   Anemia of chronic disease, lupus and IDA - FOBT negative. No evidence of active significant GI bleeding. - The patient is [name] and declines blood products for support. - Iron, B-12, folate WNL.   Depression - Continue Zoloft.   Hypokalemia / hypomagnesemia - Resolved with supplementation.   Supratherapeutic INR - Coumadin resumed on 11/11/14 per pharmacy.   Obesity  - Body mass index is 31.2 kg/(m^2).  Procedures:  Small bowel endoscopy  Consultations:  Gastroenterology with Dr. Fuller Plan  Discharge Exam: Filed Vitals:   11/14/14 0911  BP: 140/81  Pulse:   Temp:   Resp:     General: Patient in no acute distress, alert and awake Cardiovascular: No cyanosis Respiratory: Equal chest rise, no audible wheezes Abdomen: none distended, no guarding  Discharge Instructions   Discharge Instructions    Diet - low sodium heart healthy    Complete by:  As directed      Discharge instructions    Complete by:  As directed   Please follow up with your primary care physician in 1-2 weeks or sooner should any new concerns arise.     Increase activity slowly    Complete by:  As directed           Current Discharge Medication List    START taking these medications   Details  fluconazole (DIFLUCAN) 100 MG tablet Take 1 tablet (100 mg total) by mouth daily. Qty: 4 tablet, Refills: 0    insulin glargine (LANTUS) 100 UNIT/ML injection Inject 0.1 mLs (10 Units total) into the skin  daily. Qty: 10 mL, Refills: 0    morphine (MSIR) 15 MG tablet Take 1 tablet (15 mg total) by mouth every 4 (four) hours as needed for moderate pain. Qty: 30 tablet, Refills: 0      CONTINUE these medications which have CHANGED   Details  predniSONE (DELTASONE) 20 MG tablet Take 1 tablet (20 mg total) by mouth daily with breakfast. Take 2 tablets by mouth for the next 3 days then take 1 tablet by mouth for the next 3 days then take your regular home regimen dose of prednisone prior to this admission Qty: 9 tablet, Refills: 0    warfarin (COUMADIN) 2.5 MG tablet Take 1 tablet (2.5 mg total) by mouth daily at 12 noon. Qty: 15 tablet, Refills: 0      CONTINUE these medications which have NOT CHANGED   Details  atorvastatin (LIPITOR) 40 MG tablet Take 1 tablet (40 mg total) by mouth daily. Qty: 90 tablet, Refills: 3    calcitRIOL (ROCALTROL) 0.25 MCG capsule TAKE ONE CAPSULE BY MOUTH DAILY Qty: 30 capsule, Refills: 0    diltiazem (TIAZAC) 240 MG 24 hr capsule Take 1 capsule (240 mg total) by mouth daily. Qty: 30 capsule, Refills: 11    donepezil (ARICEPT) 5 MG tablet Take 1 tablet (5 mg total) by mouth at bedtime. Qty: 30 tablet, Refills: 0  esomeprazole (NEXIUM) 20 MG capsule TAKE ONE CAPSULE BY MOUTH EVERY MORNING BEFORE BREAKFAST Qty: 90 capsule, Refills: 0    folic acid (FOLVITE) 1 MG tablet Take 1 mg by mouth daily.    losartan (COZAAR) 100 MG tablet Take 1 tablet (100 mg total) by mouth daily. Qty: 30 tablet, Refills: 11    mycophenolate (CELLCEPT) 250 MG capsule Take 250 mg by mouth 2 (two) times daily.    ondansetron (ZOFRAN ODT) 4 MG disintegrating tablet 4mg  ODT q4 hours prn nausea/vomiting Qty: 15 tablet, Refills: 0    potassium chloride (K-DUR) 10 MEQ tablet Take 1 tablet (10 mEq total) by mouth daily. Qty: 30 tablet, Refills: 5    sertraline (ZOLOFT) 100 MG tablet Take 1 tablet (100 mg total) by mouth daily. Qty: 30 tablet, Refills: 11    solifenacin  (VESICARE) 10 MG tablet Take 10 mg by mouth daily.     tacrolimus (PROGRAF) 1 MG capsule Take 1 mg by mouth 2 (two) times daily.     Incontinence Supplies MISC "pull-up" type, 6 per day Qty: 200 each, Refills: 11    nystatin (MYCOSTATIN) powder Apply 1 g topically 4 (four) times daily as needed. For yeast under breast      STOP taking these medications     senna-docusate (SENOKOT-S) 8.6-50 MG per tablet        Allergies  Allergen Reactions  . Oxycodone-Acetaminophen Shortness Of Breath and Nausea Only  . Propoxyphene N-Acetaminophen Shortness Of Breath and Nausea Only  . Sulfonamide Derivatives Shortness Of Breath and Nausea Only  . Codeine Nausea Only  . Latex Rash  . Metoprolol Rash      The results of significant diagnostics from this hospitalization (including imaging, microbiology, ancillary and laboratory) are listed below for reference.    Significant Diagnostic Studies: Ct Abdomen Pelvis W Contrast  11/07/2014   CLINICAL DATA:  Abdominal pain, inflammation of jejunal loops  EXAM: CT ABDOMEN AND PELVIS WITH CONTRAST  TECHNIQUE: Multidetector CT imaging of the abdomen and pelvis was performed using the standard protocol following bolus administration of intravenous contrast.  CONTRAST:  121mL OMNIPAQUE IOHEXOL 300 MG/ML  SOLN  COMPARISON:  11/04/2014  FINDINGS: Lung bases shows small right pleural effusion with right lower lobe posteriorly atelectasis.  There is skin thickening and stranding of subcutaneous fat with subcutaneous edema in right flank wall. Clinical correlation is necessary to exclude cellulitis.  There is small perihepatic ascites. The patient is status postcholecystectomy.  Atherosclerotic calcifications of abdominal aorta and iliac arteries again noted. Again noted atrophic native kidneys. Normal appearing transplanted kidney in right lower quadrant.  There is significant improvement in the segmental inflammatory changes in jejunum. Only residual mild gaseous  distended segmental jejunal loop at the site of previous inflammation. Minimal residual thickened jejunal wall with improvement from prior exam. Improvement in surrounding mesenteric inflammatory changes.  The pancreas, spleen and adrenal glands are unremarkable.  There is residual mild distension of the cecum with gas with improvement from prior exam. The cecum measures only 2.7 cm in diameter. Again the cecum is located in mid anterior abdomen suspicious for mobile cecum. There is no thickening of cecal wall. No evidence of cecal volvulus.  Abdominal wall scarring is noted in right lower quadrant. The uterus and adnexa are unremarkable. The urinary bladder is unremarkable. Fifth again noted descending colon and sigmoid colon diverticula. No evidence of acute diverticulitis.  IMPRESSION: 1. Significant improvement in segmental inflammatory changes of jejunum. Only residual mild segmental gaseous dilatation  of jejunum. Minimal residual wall thickening with improvement from prior exam. Improvement in adjacent mesenteric stranding. 2. Residual mild gaseous distended cecum with improvement from prior exam. The cecum measures 2.7 cm in diameter. No evidence of cecal volvulus or thickening of cecal wall. 3. There is skin thickening and stranding of subcutaneous fat and subcutaneous edema in right flank wall. Cellulitis or subcutaneous edema cannot be excluded. Clinical correlation is necessary 4. Normal appearing transplanted kidney. Again noted atrophic native kidneys. No hydronephrosis. 5. No small bowel or colonic obstruction.   Electronically Signed   By: Lahoma Crocker M.D.   On: 11/07/2014 12:30   Ct Abdomen Pelvis W Contrast  11/04/2014   CLINICAL DATA:  Umbilical pain, abdominal pain, diarrhea  EXAM: CT ABDOMEN AND PELVIS WITH CONTRAST  TECHNIQUE: Multidetector CT imaging of the abdomen and pelvis was performed using the standard protocol following bolus administration of intravenous contrast.  CONTRAST:  132mL  OMNIPAQUE IOHEXOL 300 MG/ML  SOLN  COMPARISON:  5/28/ 16  FINDINGS: There is small right pleural effusion with right lower lobe posterior atelectasis. Again noted perihepatic ascites increased in size from prior exam. Postcholecystectomy surgical clips are noted. There is contrast material in distal esophagus probable from gastroesophageal reflux.  There is no evidence of gastric outlet obstruction.  No evidence of small bowel obstruction. Again noted significant segmental thickening of jejunal wall in left lower quadrant best seen in axial image 54. Mild worsening from prior exam. Wall thickness measures at least 1.1 cm. Again findings consistent with segmental enteritis probable infectious. Atherosclerotic calcifications of abdominal aorta and bilateral iliac artery again noted. Atherosclerotic calcifications bilateral renal artery origin. Atherosclerotic calcifications of celiac trunk and SMA origin.  There is again noted mild stranding of mesenteric fat adjacent to inflamed segment of small bowel in left abdomen. Small mesenteric lymph nodes are noted at this level probable reactive.  The native kidneys are small in size atrophic. Stable lesion seen native kidneys. Again noted normal function an appearance of transplanted kidney in right lower quadrant.  There is significant gaseous distension of the cecum in left mid abdomen anteriorly. The cecum measures at least 6 cm in diameter suspicious for segmental sickle ileus. There is no evidence of small bowel obstruction. Contrast material is noted in right colon and transverse colon. 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 Small amount of ascites is noted in right paracolic gutter. Normal appendix is partially visualized in axial image 59. There is small amount of pelvic ascites in right lower pelvis. The uterus and adnexa are unremarkable. No distal colonic obstruction. Few  colonic diverticula are noted in left colon. There is contrast material in left colon and sigmoid colon.  Delayed renal images shows normal excretion of transplanted kidney.  Sagittal images of the spine are unremarkable. The pancreas, spleen and adrenal glands are unremarkable. No focal hepatic mass. Status postcholecystectomy.  IMPRESSION: 1. There is slight worsening in segmental inflammatory changes in jejunum in left abdomen. Slight worsening of thickening of the jejunal wall up to 1.1 cm. Again findings consistent with segmental enteritis. This may be infectious in nature or due to lupus enteritis 2. Mild increased perihepatic ascites. 3. There is small right pleural effusion with right lower lobe posterior atelectasis. 4. There is significant gaseous distension of the cecum. Measures up to 6 cm. There is mild angulation and anterior position of the cecum . Findings suspicious for mobile cecum. There  is no thickening of cecal wall to suggest cecal volvulus. There is no evidence of obstruction. Cecal ileus cannot be excluded. 5. Normal appendix. No colonic obstruction. Contrast material is noted throughout the colon. 6. Small amount of pelvic ascites. 7. Normal function and appearance of transplanted kidney. Again noted atrophic native kidneys.   Electronically Signed   By: Lahoma Crocker M.D.   On: 11/04/2014 17:02   Ct Abdomen Pelvis W Contrast  11/02/2014   CLINICAL DATA:  Acute abdominal pain  EXAM: CT ABDOMEN AND PELVIS WITH CONTRAST  TECHNIQUE: Multidetector CT imaging of the abdomen and pelvis was performed using the standard protocol following bolus administration of intravenous contrast.  CONTRAST:  171m OMNIPAQUE IOHEXOL 300 MG/ML  SOLN  COMPARISON:  06/28/2014  FINDINGS: BODY WALL: No contributory findings.  LOWER CHEST: Extensive coronary atherosclerosis for age  ABDOMEN/PELVIS:  Liver: No focal abnormality.  Biliary: Cholecystectomy with chronic intra and extrahepatic biliary duct enlargement   Pancreas: Unremarkable.  Spleen: Single coarse calcification, likely granulomatous.  Adrenals: Unremarkable.  Kidneys and ureters: There is a transplant kidney in the right lower quadrant, already excreting contrast. Would always consult with transplant surgeon prior to administering IV contrast. Severe native renal atrophy with stable low-density renal lesions which are indeterminate due to soft tissue density, but size stable since 2013, 22 mm on the left and 16 mm on the right.  Bladder: Unremarkable.  Reproductive: No pathologic findings.  Bowel: There is severe wall thickening of a jejunum segment with low-density submucosal appearance consistent with edema. The mesentery is diffusely edematous. The associated vessels appear widely patent. There is no evidence of perforation or abscess. No bowel obstruction. Distal colonic diverticulosis. Normal appendix.  Retroperitoneum: No mass or adenopathy.  Peritoneum: Small ascites around the liver and in the pelvis, considered reactive.  Vascular: No acute abnormality.  OSSEOUS: No acute abnormalities.  IMPRESSION: 1. Severe short-segment jejunitis, likely infectious or lupus enteritis (given patient's history). 2. Bilateral native renal lesions are indeterminate due to density, but size stable from 2013. 3. Additional chronic findings are stable from prior noted above.   Electronically Signed   By: JMonte FantasiaM.D.   On: 11/02/2014 04:41    Microbiology: Recent Results (from the past 240 hour(s))  Clostridium Difficile by PCR     Status: None   Collection Time: 11/05/14  3:18 AM  Result Value Ref Range Status   C difficile by pcr NEGATIVE NEGATIVE Final  Clostridium Difficile by PCR     Status: None   Collection Time: 11/08/14  8:44 PM  Result Value Ref Range Status   C difficile by pcr NEGATIVE NEGATIVE Final     Labs: Basic Metabolic Panel:  Recent Labs Lab 11/08/14 0433 11/09/14 0449 11/11/14 0418  NA 137 138 138  K 3.4* 4.0 4.7  CL 105  107 101  CO2 _0 GLUCOSE 230* 248* 233*  BUN _1 CREATININE 0.50 0.50 0.45  CALCIUM 8.5* 8.6* 9.0   Liver Function Tests: No results for input(s): AST, ALT, ALKPHOS, BILITOT, PROT, ALBUMIN in the last 168 hours. No results for input(s): LIPASE, AMYLASE in the last 168 hours. No results for input(s): AMMONIA in the last 168 hours. CBC:  Recent Labs Lab 11/08/14 0433 11/11/14 0418  WBC 7.1 9.7  HGB 8.9* 10.9*  HCT 27.4* 34.0*  MCV 89.0 89.9  PLT 312 297   Cardiac Enzymes: No results for input(s): CKTOTAL, CKMB, CKMBINDEX, TROPONINI in the last 168 hours.  BNP: BNP (last 3 results) No results for input(s): BNP in the last 8760 hours.  ProBNP (last 3 results) No results for input(s): PROBNP in the last 8760 hours.  CBG:  Recent Labs Lab 11/13/14 1159 11/13/14 1645 11/13/14 2103 11/14/14 0746 11/14/14 1210  GLUCAP 242* 157* 234* 151* 224*       Signed:  Velvet Bathe  [facility] 11/14/2014, 1:09 PM

## 2014-11-14 NOTE — Clinical Social Work Placement (Signed)
Patient is set to discharge to [facility] today. Patient & daughter, Connie Ruiz aware. Discharge packet given to RN, Cristy. [facility] called for transport.     Raynaldo Opitz, [facility] Clinical Social Worker cell #: [phone]    CLINICAL SOCIAL WORK PLACEMENT  NOTE  Date:  11/14/2014  Patient Details  Name: Connie Ruiz MRN: [id number] Date of Birth: 04-Apr-1961  Clinical Social Work is seeking post-discharge placement for this patient at the [place] level of care (*CSW will initial, date and re-position this form in  chart as items are completed):  Yes   Patient/family provided with [facility] Work Department's list of facilities offering this level of care within the geographic area requested by the patient (or if unable, by the patient's family).  Yes   Patient/family informed of their freedom to choose among providers that offer the needed level of care, that participate in Medicare, Medicaid or managed care program needed by the patient, have an available bed and are willing to accept the patient.  Yes   Patient/family informed of [facility]'s ownership interest in [facility] and [facility], as well as of the fact that they are under no obligation to receive care at these facilities.  PASRR submitted to EDS on 11/14/14     PASRR number received on 11/14/14     Existing PASRR number confirmed on       FL2 transmitted to all facilities in geographic area requested by pt/family on 11/14/14     FL2 transmitted to all facilities within larger geographic area on       Patient informed that his/her managed care company has contracts with or will negotiate with certain facilities, including the following:        Yes   Patient/family informed of bed offers received.  Patient chooses bed at [place] [place]     Physician recommends and patient chooses bed at      Patient to be transferred  to [place] [place] on 11/14/14.  Patient to be transferred to facility by [facility]     Patient family notified on 11/14/14 of transfer.  Name of family member notified:  patient's daughter at bedside     PHYSICIAN       Additional Comment:    _______________________________________________ Standley Brooking, LCSW 11/14/2014, 2:03 PM

## 2014-11-14 NOTE — Progress Notes (Signed)
Patient ID: Connie Ruiz, female   DOB: 1961-01-02, 54 y.o.   MRN: [id number]    Progress Note   Subjective  Pt irritable... Still hurting and says she wants to go to [facility] because she wants the enteroscopy done... And her regular MD's she sees for Lupus , and renal transplant are there.  She admits she get cranky with steroids.. May be a bit better than on admit and even though it increases pain she wants to keep eating solid food- her pain is not controlled   Objective   Vital signs in last 24 hours: Temp:  [97.9 F (36.6 C)-98.4 F (36.9 C)] 98.4 F (36.9 C) (06/08 2108) Pulse Rate:  [71-78] 78 (06/08 2108) Resp:  [16-18] 18 (06/08 2108) BP: (133-140)/(71-81) 140/81 mmHg (06/09 0911) SpO2:  [98 %] 98 % (06/08 2108) Last BM Date: 11/12/14 General:    AA  female in NAD Heart:  Regular rate and rhythm; no murmurs Lungs: Respirations even and unlabored, lungs CTA bilaterally Abdomen:  Soft, large ,tender and nondistended. Normal bowel sounds. Extremities:  Without edema. Neurologic:  Alert and oriented,  grossly normal neurologically. Psych:  Cooperative. Normal mood and affect.  Intake/Output from previous day: 06/08 0701 - 06/09 0700 In: 59 [P.O.:480; I.V.:900] Out: 200 [Urine:200] Intake/Output this shift: Total I/O In: 240 [P.O.:240] Out: -   Lab Results:   PT/INR  Recent Labs  11/13/14 0430 11/14/14 0429  LABPROT 16.9* 26.6*  INR 1.37 2.48*      Assessment / Plan:   #1 54 yo female with Lupus with an acute jejunitis-likely a vasculitic process related to lupus  Continue oral steroids It is certainly reasonable to transfer to [facility] for management, and double balloon enteroscopy per GI there (which cannot be done here)  Will defer to Hospitalist regarding transfer. #2 s/p renal transplant- chronic immunosuppression #3 prior CVA #4 hx DVT #5 IDDM  Principal Problem:   Jejunitis Active Problems:   Essential hypertension   Cerebral artery occlusion  with cerebral infarction   LUPUS   KIDNEY TRANSPLANTATION, HX OF   DVT (deep venous thrombosis)   Depression   History of stroke with residual effects   Supratherapeutic INR   Hypomagnesemia   Abdominal pain, acute   Steroid-induced hyperglycemia   Abnormal CT of the abdomen   Candida esophagitis    LOS: 12 days   Connie Ruiz  11/14/2014, 9:45 AM     Attending physician's note   I have taken an interval history, reviewed the chart and examined the patient. I agree with the Advanced Practitioner's note, impression and recommendations. She has improved since admission but little or no improvement the past few days. Pt asking about transfer to [facility] for further care and GI evaluation under the care of her [facility] Rheumatologist, Nephrologist. I think this would be best for her acute and ongoing care. Will ask [facility] to arrange transfer if they concur.   Connie Ruiz Plan, MD [facility]

## 2014-11-15 DIAGNOSIS — M810 Age-related osteoporosis without current pathological fracture: Secondary | ICD-10-CM | POA: Diagnosis not present

## 2014-11-15 DIAGNOSIS — N3941 Urge incontinence: Secondary | ICD-10-CM | POA: Diagnosis not present

## 2014-11-15 DIAGNOSIS — R791 Abnormal coagulation profile: Secondary | ICD-10-CM | POA: Diagnosis not present

## 2014-11-15 DIAGNOSIS — E785 Hyperlipidemia, unspecified: Secondary | ICD-10-CM | POA: Diagnosis not present

## 2014-11-15 DIAGNOSIS — K529 Noninfective gastroenteritis and colitis, unspecified: Secondary | ICD-10-CM | POA: Diagnosis not present

## 2014-11-15 DIAGNOSIS — D6861 Antiphospholipid syndrome: Secondary | ICD-10-CM | POA: Diagnosis not present

## 2014-11-15 DIAGNOSIS — D509 Iron deficiency anemia, unspecified: Secondary | ICD-10-CM | POA: Diagnosis not present

## 2014-11-15 DIAGNOSIS — E669 Obesity, unspecified: Secondary | ICD-10-CM | POA: Diagnosis not present

## 2014-11-15 DIAGNOSIS — E876 Hypokalemia: Secondary | ICD-10-CM | POA: Diagnosis not present

## 2014-11-15 DIAGNOSIS — E119 Type 2 diabetes mellitus without complications: Secondary | ICD-10-CM | POA: Diagnosis not present

## 2014-11-15 DIAGNOSIS — D638 Anemia in other chronic diseases classified elsewhere: Secondary | ICD-10-CM | POA: Diagnosis not present

## 2014-11-15 DIAGNOSIS — F0390 Unspecified dementia without behavioral disturbance: Secondary | ICD-10-CM | POA: Diagnosis not present

## 2014-11-15 DIAGNOSIS — R110 Nausea: Secondary | ICD-10-CM | POA: Diagnosis not present

## 2014-11-15 DIAGNOSIS — R109 Unspecified abdominal pain: Secondary | ICD-10-CM | POA: Diagnosis not present

## 2014-11-15 DIAGNOSIS — E0965 Drug or chemical induced diabetes mellitus with hyperglycemia: Secondary | ICD-10-CM | POA: Diagnosis not present

## 2014-11-15 DIAGNOSIS — R1012 Left upper quadrant pain: Secondary | ICD-10-CM | POA: Diagnosis not present

## 2014-11-15 DIAGNOSIS — Z7901 Long term (current) use of anticoagulants: Secondary | ICD-10-CM | POA: Diagnosis not present

## 2014-11-15 DIAGNOSIS — I82409 Acute embolism and thrombosis of unspecified deep veins of unspecified lower extremity: Secondary | ICD-10-CM | POA: Diagnosis not present

## 2014-11-15 DIAGNOSIS — R197 Diarrhea, unspecified: Secondary | ICD-10-CM | POA: Diagnosis not present

## 2014-11-15 DIAGNOSIS — K2970 Gastritis, unspecified, without bleeding: Secondary | ICD-10-CM | POA: Diagnosis not present

## 2014-11-15 DIAGNOSIS — K5289 Other specified noninfective gastroenteritis and colitis: Secondary | ICD-10-CM | POA: Diagnosis not present

## 2014-11-15 DIAGNOSIS — N289 Disorder of kidney and ureter, unspecified: Secondary | ICD-10-CM | POA: Diagnosis not present

## 2014-11-15 DIAGNOSIS — L299 Pruritus, unspecified: Secondary | ICD-10-CM | POA: Diagnosis not present

## 2014-11-15 DIAGNOSIS — R946 Abnormal results of thyroid function studies: Secondary | ICD-10-CM | POA: Diagnosis not present

## 2014-11-15 DIAGNOSIS — M3214 Glomerular disease in systemic lupus erythematosus: Secondary | ICD-10-CM | POA: Diagnosis not present

## 2014-11-15 DIAGNOSIS — Z9049 Acquired absence of other specified parts of digestive tract: Secondary | ICD-10-CM | POA: Diagnosis not present

## 2014-11-15 DIAGNOSIS — I69351 Hemiplegia and hemiparesis following cerebral infarction affecting right dominant side: Secondary | ICD-10-CM | POA: Diagnosis not present

## 2014-11-15 DIAGNOSIS — E1165 Type 2 diabetes mellitus with hyperglycemia: Secondary | ICD-10-CM | POA: Diagnosis not present

## 2014-11-15 DIAGNOSIS — Z6832 Body mass index (BMI) 32.0-32.9, adult: Secondary | ICD-10-CM | POA: Diagnosis not present

## 2014-11-15 DIAGNOSIS — Z7952 Long term (current) use of systemic steroids: Secondary | ICD-10-CM | POA: Diagnosis not present

## 2014-11-15 DIAGNOSIS — T380X5A Adverse effect of glucocorticoids and synthetic analogues, initial encounter: Secondary | ICD-10-CM | POA: Diagnosis not present

## 2014-11-15 DIAGNOSIS — R1032 Left lower quadrant pain: Secondary | ICD-10-CM | POA: Diagnosis not present

## 2014-11-15 DIAGNOSIS — Z940 Kidney transplant status: Secondary | ICD-10-CM | POA: Diagnosis not present

## 2014-11-15 DIAGNOSIS — E1151 Type 2 diabetes mellitus with diabetic peripheral angiopathy without gangrene: Secondary | ICD-10-CM | POA: Diagnosis not present

## 2014-11-15 DIAGNOSIS — B3781 Candidal esophagitis: Secondary | ICD-10-CM | POA: Diagnosis not present

## 2014-11-15 DIAGNOSIS — E440 Moderate protein-calorie malnutrition: Secondary | ICD-10-CM | POA: Diagnosis not present

## 2014-11-15 DIAGNOSIS — I82509 Chronic embolism and thrombosis of unspecified deep veins of unspecified lower extremity: Secondary | ICD-10-CM | POA: Diagnosis not present

## 2014-11-15 DIAGNOSIS — I10 Essential (primary) hypertension: Secondary | ICD-10-CM | POA: Diagnosis not present

## 2014-11-15 DIAGNOSIS — F329 Major depressive disorder, single episode, unspecified: Secondary | ICD-10-CM | POA: Diagnosis not present

## 2014-11-15 DIAGNOSIS — M329 Systemic lupus erythematosus, unspecified: Secondary | ICD-10-CM | POA: Diagnosis not present

## 2014-11-15 DIAGNOSIS — Z9889 Other specified postprocedural states: Secondary | ICD-10-CM | POA: Diagnosis not present

## 2014-11-20 DIAGNOSIS — M329 Systemic lupus erythematosus, unspecified: Secondary | ICD-10-CM | POA: Diagnosis not present

## 2014-11-20 DIAGNOSIS — I82509 Chronic embolism and thrombosis of unspecified deep veins of unspecified lower extremity: Secondary | ICD-10-CM | POA: Diagnosis not present

## 2014-11-20 DIAGNOSIS — K529 Noninfective gastroenteritis and colitis, unspecified: Secondary | ICD-10-CM | POA: Diagnosis not present

## 2014-11-20 DIAGNOSIS — K5289 Other specified noninfective gastroenteritis and colitis: Secondary | ICD-10-CM | POA: Diagnosis not present

## 2014-11-20 DIAGNOSIS — M3214 Glomerular disease in systemic lupus erythematosus: Secondary | ICD-10-CM | POA: Diagnosis not present

## 2014-11-20 DIAGNOSIS — I10 Essential (primary) hypertension: Secondary | ICD-10-CM | POA: Diagnosis not present

## 2014-11-20 DIAGNOSIS — K319 Disease of stomach and duodenum, unspecified: Secondary | ICD-10-CM | POA: Diagnosis not present

## 2014-11-20 DIAGNOSIS — Z7901 Long term (current) use of anticoagulants: Secondary | ICD-10-CM | POA: Diagnosis not present

## 2014-11-20 DIAGNOSIS — E119 Type 2 diabetes mellitus without complications: Secondary | ICD-10-CM | POA: Diagnosis not present

## 2014-11-20 DIAGNOSIS — D6861 Antiphospholipid syndrome: Secondary | ICD-10-CM | POA: Diagnosis not present

## 2014-11-20 DIAGNOSIS — B3781 Candidal esophagitis: Secondary | ICD-10-CM | POA: Diagnosis not present

## 2014-11-20 DIAGNOSIS — M810 Age-related osteoporosis without current pathological fracture: Secondary | ICD-10-CM | POA: Diagnosis not present

## 2014-11-20 DIAGNOSIS — E876 Hypokalemia: Secondary | ICD-10-CM | POA: Diagnosis not present

## 2014-11-20 DIAGNOSIS — F0390 Unspecified dementia without behavioral disturbance: Secondary | ICD-10-CM | POA: Diagnosis not present

## 2014-11-20 DIAGNOSIS — I82409 Acute embolism and thrombosis of unspecified deep veins of unspecified lower extremity: Secondary | ICD-10-CM | POA: Diagnosis not present

## 2014-11-20 DIAGNOSIS — R109 Unspecified abdominal pain: Secondary | ICD-10-CM | POA: Diagnosis not present

## 2014-11-20 DIAGNOSIS — B370 Candidal stomatitis: Secondary | ICD-10-CM | POA: Diagnosis not present

## 2014-11-26 DIAGNOSIS — K319 Disease of stomach and duodenum, unspecified: Secondary | ICD-10-CM | POA: Diagnosis not present

## 2014-11-27 ENCOUNTER — Telehealth: Payer: Self-pay | Admitting: Neurology

## 2014-11-27 NOTE — Telephone Encounter (Signed)
Pt's daughter [name]' called and wanted a call back to ask a question about her mothers dementia/Dawn CB# [phone]

## 2014-11-27 NOTE — Telephone Encounter (Signed)
I spoke with this patient  Daughter  However she said she had some business to tend to and would have to call me back in 20 minutes . This patient has appt tomorrow with Dr Tomi Likens

## 2014-11-28 ENCOUNTER — Ambulatory Visit: Payer: Medicaid Other | Admitting: Neurology

## 2014-12-05 DIAGNOSIS — B370 Candidal stomatitis: Secondary | ICD-10-CM | POA: Diagnosis not present

## 2014-12-05 DIAGNOSIS — D6861 Antiphospholipid syndrome: Secondary | ICD-10-CM | POA: Diagnosis not present

## 2014-12-05 DIAGNOSIS — B3781 Candidal esophagitis: Secondary | ICD-10-CM | POA: Diagnosis not present

## 2014-12-05 DIAGNOSIS — I82409 Acute embolism and thrombosis of unspecified deep veins of unspecified lower extremity: Secondary | ICD-10-CM | POA: Diagnosis not present

## 2014-12-19 DIAGNOSIS — B3781 Candidal esophagitis: Secondary | ICD-10-CM | POA: Diagnosis not present

## 2014-12-19 DIAGNOSIS — I10 Essential (primary) hypertension: Secondary | ICD-10-CM | POA: Diagnosis not present

## 2014-12-19 DIAGNOSIS — I509 Heart failure, unspecified: Secondary | ICD-10-CM | POA: Diagnosis not present

## 2014-12-19 DIAGNOSIS — R531 Weakness: Secondary | ICD-10-CM | POA: Diagnosis not present

## 2014-12-19 DIAGNOSIS — E099 Drug or chemical induced diabetes mellitus without complications: Secondary | ICD-10-CM | POA: Diagnosis not present

## 2014-12-19 DIAGNOSIS — M329 Systemic lupus erythematosus, unspecified: Secondary | ICD-10-CM | POA: Diagnosis not present

## 2014-12-19 DIAGNOSIS — D6861 Antiphospholipid syndrome: Secondary | ICD-10-CM | POA: Diagnosis not present

## 2014-12-19 DIAGNOSIS — K529 Noninfective gastroenteritis and colitis, unspecified: Secondary | ICD-10-CM | POA: Diagnosis not present

## 2014-12-19 DIAGNOSIS — F0390 Unspecified dementia without behavioral disturbance: Secondary | ICD-10-CM | POA: Diagnosis not present

## 2014-12-19 DIAGNOSIS — M1990 Unspecified osteoarthritis, unspecified site: Secondary | ICD-10-CM | POA: Diagnosis not present

## 2014-12-19 DIAGNOSIS — M810 Age-related osteoporosis without current pathological fracture: Secondary | ICD-10-CM | POA: Diagnosis not present

## 2014-12-19 DIAGNOSIS — K210 Gastro-esophageal reflux disease with esophagitis: Secondary | ICD-10-CM | POA: Diagnosis not present

## 2014-12-19 DIAGNOSIS — R32 Unspecified urinary incontinence: Secondary | ICD-10-CM | POA: Diagnosis not present

## 2014-12-23 DIAGNOSIS — E099 Drug or chemical induced diabetes mellitus without complications: Secondary | ICD-10-CM | POA: Diagnosis not present

## 2014-12-23 DIAGNOSIS — K210 Gastro-esophageal reflux disease with esophagitis: Secondary | ICD-10-CM | POA: Diagnosis not present

## 2014-12-23 DIAGNOSIS — F0390 Unspecified dementia without behavioral disturbance: Secondary | ICD-10-CM | POA: Diagnosis not present

## 2014-12-23 DIAGNOSIS — I509 Heart failure, unspecified: Secondary | ICD-10-CM | POA: Diagnosis not present

## 2014-12-23 DIAGNOSIS — M810 Age-related osteoporosis without current pathological fracture: Secondary | ICD-10-CM | POA: Diagnosis not present

## 2014-12-23 DIAGNOSIS — B3781 Candidal esophagitis: Secondary | ICD-10-CM | POA: Diagnosis not present

## 2014-12-23 DIAGNOSIS — I10 Essential (primary) hypertension: Secondary | ICD-10-CM | POA: Diagnosis not present

## 2014-12-23 DIAGNOSIS — R531 Weakness: Secondary | ICD-10-CM | POA: Diagnosis not present

## 2014-12-23 DIAGNOSIS — K529 Noninfective gastroenteritis and colitis, unspecified: Secondary | ICD-10-CM | POA: Diagnosis not present

## 2014-12-23 DIAGNOSIS — M329 Systemic lupus erythematosus, unspecified: Secondary | ICD-10-CM | POA: Diagnosis not present

## 2014-12-23 DIAGNOSIS — D6861 Antiphospholipid syndrome: Secondary | ICD-10-CM | POA: Diagnosis not present

## 2014-12-23 DIAGNOSIS — M1990 Unspecified osteoarthritis, unspecified site: Secondary | ICD-10-CM | POA: Diagnosis not present

## 2014-12-23 DIAGNOSIS — R32 Unspecified urinary incontinence: Secondary | ICD-10-CM | POA: Diagnosis not present

## 2014-12-24 ENCOUNTER — Telehealth: Payer: Self-pay | Admitting: Endocrinology

## 2014-12-24 DIAGNOSIS — B3781 Candidal esophagitis: Secondary | ICD-10-CM | POA: Diagnosis not present

## 2014-12-24 DIAGNOSIS — M810 Age-related osteoporosis without current pathological fracture: Secondary | ICD-10-CM | POA: Diagnosis not present

## 2014-12-24 DIAGNOSIS — K529 Noninfective gastroenteritis and colitis, unspecified: Secondary | ICD-10-CM | POA: Diagnosis not present

## 2014-12-24 DIAGNOSIS — M1990 Unspecified osteoarthritis, unspecified site: Secondary | ICD-10-CM | POA: Diagnosis not present

## 2014-12-24 DIAGNOSIS — D6861 Antiphospholipid syndrome: Secondary | ICD-10-CM | POA: Diagnosis not present

## 2014-12-24 DIAGNOSIS — R531 Weakness: Secondary | ICD-10-CM | POA: Diagnosis not present

## 2014-12-24 DIAGNOSIS — R32 Unspecified urinary incontinence: Secondary | ICD-10-CM | POA: Diagnosis not present

## 2014-12-24 DIAGNOSIS — M329 Systemic lupus erythematosus, unspecified: Secondary | ICD-10-CM | POA: Diagnosis not present

## 2014-12-24 DIAGNOSIS — F0390 Unspecified dementia without behavioral disturbance: Secondary | ICD-10-CM | POA: Diagnosis not present

## 2014-12-24 DIAGNOSIS — K210 Gastro-esophageal reflux disease with esophagitis: Secondary | ICD-10-CM | POA: Diagnosis not present

## 2014-12-24 DIAGNOSIS — E099 Drug or chemical induced diabetes mellitus without complications: Secondary | ICD-10-CM | POA: Diagnosis not present

## 2014-12-24 DIAGNOSIS — I509 Heart failure, unspecified: Secondary | ICD-10-CM | POA: Diagnosis not present

## 2014-12-24 DIAGNOSIS — I10 Essential (primary) hypertension: Secondary | ICD-10-CM | POA: Diagnosis not present

## 2014-12-24 NOTE — Telephone Encounter (Signed)
Attempted to reach pt. Will try again at a later time.

## 2014-12-24 NOTE — Telephone Encounter (Signed)
See note below to be advised.  Thanks!

## 2014-12-24 NOTE — Telephone Encounter (Signed)
I tried to contact the pt. Pt was unavailable at this time. Will try at a later time.

## 2014-12-24 NOTE — Telephone Encounter (Signed)
Ov tomorrow 

## 2014-12-24 NOTE — Telephone Encounter (Signed)
[facility] note dated 12/23/14 at 7:46 PM: Caller says that her mother was released from the nursing home Wed. She has CDiff and has been on a med, but will be finished today. And she is still having many diarrhea bouts PreDisposition Did not know what to do ---Caller says that her mother was released from the nursing home Wed. She has C-Diff and has been on a med, but will be finished today. And she is still having many diarrhea bouts. Was on antibiotic for 10 days.

## 2014-12-25 ENCOUNTER — Ambulatory Visit (INDEPENDENT_AMBULATORY_CARE_PROVIDER_SITE_OTHER): Payer: Medicare Other | Admitting: *Deleted

## 2014-12-25 ENCOUNTER — Encounter: Payer: Self-pay | Admitting: Endocrinology

## 2014-12-25 ENCOUNTER — Telehealth: Payer: Self-pay | Admitting: Endocrinology

## 2014-12-25 ENCOUNTER — Ambulatory Visit (INDEPENDENT_AMBULATORY_CARE_PROVIDER_SITE_OTHER): Payer: Medicare Other | Admitting: Endocrinology

## 2014-12-25 VITALS — BP 137/88 | HR 87 | Temp 98.8°F | Ht 64.0 in | Wt 171.0 lb

## 2014-12-25 DIAGNOSIS — E1122 Type 2 diabetes mellitus with diabetic chronic kidney disease: Secondary | ICD-10-CM

## 2014-12-25 DIAGNOSIS — I639 Cerebral infarction, unspecified: Secondary | ICD-10-CM | POA: Diagnosis not present

## 2014-12-25 DIAGNOSIS — I6350 Cerebral infarction due to unspecified occlusion or stenosis of unspecified cerebral artery: Secondary | ICD-10-CM

## 2014-12-25 DIAGNOSIS — I2699 Other pulmonary embolism without acute cor pulmonale: Secondary | ICD-10-CM

## 2014-12-25 DIAGNOSIS — N189 Chronic kidney disease, unspecified: Secondary | ICD-10-CM

## 2014-12-25 LAB — POCT GLYCOSYLATED HEMOGLOBIN (HGB A1C): Hemoglobin A1C: 7

## 2014-12-25 LAB — POCT INR: INR: 1.4

## 2014-12-25 MED ORDER — METRONIDAZOLE 250 MG PO TABS: 250.0000 mg | ORAL_TABLET | Freq: Three times a day (TID) | ORAL | Status: DC

## 2014-12-25 MED ORDER — INCONTINENCE BRIEF LARGE MISC: Status: DC

## 2014-12-25 NOTE — Telephone Encounter (Signed)
See note below and please advise, Thanks! 

## 2014-12-25 NOTE — Telephone Encounter (Signed)
Pt has an appointment scheduled for 2 pm today.

## 2014-12-25 NOTE — Progress Notes (Signed)
Subjective:    Patient ID: Connie Ruiz, female    DOB: 1960-11-08, 54 y.o.   MRN: [id number]  HPI Pt returns for f/u of diabetes mellitus: DM type: 2 Dx'ed: 0000000 Complications: ESRD (due to SLE--transplant in 2005), polyneuropathy, and CVA.   Therapy: insulin since June, 2016 GDM: never DKA: never Severe hypoglycemia: never Pancreatitis: never Other: pt says she took insulin for a brief time some years ago, when she was on steroids.   Interval history: she was started on prednisone 1 month ago, for jejunitis.  It has been tapered down to 3 mg daily.  She does not check cbg's.   Dtr Theadore Nan) says pt lives alone, and she does not have home care.  Past Medical History  Diagnosis Date  . THYROID NODULE, LEFT 04/10/2009  . HYPERLIPIDEMIA 08/21/2007  . GOUT 08/21/2007  . HYPERTENSION 08/21/2007    Dr. Andree Elk, [place] 08/21/2007  . CEREBROVASCULAR ACCIDENT, ACUTE 04/15/2010  . GERD 08/21/2007  . RENAL INSUFFICIENCY 08/21/2007  . LUPUS 08/21/2007  . OSTEOPOROSIS 08/21/2007    Rheumatol at [facility]  . DVT, HX OF 08/21/2007  . CLOSTRIDIUM DIFFICILE COLITIS, HX OF 08/21/2007  . KIDNEY TRANSPLANTATION, HX OF 08/22/2007    s/p renal transplant-Dr. Andree Elk, [facility]  . Pulmonary embolism 07/16/2010  . Renal failure   . Current use of long term anticoagulation     Dr. Andree Elk, [facility]  . Depression     Dr. Andree Elk, [facility]  . History of stroke with residual effects   . Right sided weakness   . CVA 04/17/2010  . Tachycardia   . DIABETES MELLITUS, TYPE II 08/21/2007  . Candida esophagitis 11/12/2014  . Steroid-induced hyperglycemia 11/09/2014    Past Surgical History  Procedure Laterality Date  . Cholecystectomy    . Tubal ligation    . Kidney transplant Right 2009  . Renal biopsy, open  1981  . Enteroscopy N/A 11/11/2014    Procedure: ENTEROSCOPY;  Surgeon: Ladene Artist, MD;  Location: [facility];  Service: Endoscopy;  Laterality: N/A;    History   Social  History  . Marital Status: Divorced    Spouse Name: N/A  . Number of Children: 1  . Years of Education: N/A   Occupational History  . DISABILITY    Social History Main Topics  . Smoking status: Never Smoker   . Smokeless tobacco: Never Used  . Alcohol Use: No  . Drug Use: No  . Sexual Activity: No   Other Topics Concern  . Not on file   Social History Narrative   Retired   Regular exercise-no    Current Outpatient Prescriptions on File Prior to Visit  Medication Sig Dispense Refill  . calcitRIOL (ROCALTROL) 0.25 MCG capsule TAKE ONE CAPSULE BY MOUTH DAILY 30 capsule 0  . diltiazem (TIAZAC) 240 MG 24 hr capsule Take 1 capsule (240 mg total) by mouth daily. 30 capsule 11  . esomeprazole (NEXIUM) 20 MG capsule TAKE ONE CAPSULE BY MOUTH EVERY MORNING BEFORE BREAKFAST 90 capsule 0  . folic acid (FOLVITE) 1 MG tablet Take 1 mg by mouth daily.    Marland Kitchen losartan (COZAAR) 100 MG tablet Take 1 tablet (100 mg total) by mouth daily. 30 tablet 11  . mycophenolate (CELLCEPT) 250 MG capsule Take 250 mg by mouth 2 (two) times daily.    Marland Kitchen nystatin (MYCOSTATIN) powder Apply 1 g topically 4 (four) times daily as needed. For yeast under breast    .  ondansetron (ZOFRAN ODT) 4 MG disintegrating tablet 4mg  ODT q4 hours prn nausea/vomiting (Patient taking differently: Take 4 mg by mouth every 8 (eight) hours as needed for nausea. ) 15 tablet 0  . potassium chloride (K-DUR) 10 MEQ tablet Take 1 tablet (10 mEq total) by mouth daily. 30 tablet 5  . predniSONE (DELTASONE) 20 MG tablet Take 1 tablet (20 mg total) by mouth daily with breakfast. Take 2 tablets by mouth for the next 3 days then take 1 tablet by mouth for the next 3 days then take your regular home regimen dose of prednisone prior to this admission 9 tablet 0  . sertraline (ZOLOFT) 100 MG tablet Take 1 tablet (100 mg total) by mouth daily. 30 tablet 11  . tacrolimus (PROGRAF) 1 MG capsule Take 1 mg by mouth 2 (two) times daily.     Marland Kitchen warfarin  (COUMADIN) 2.5 MG tablet Take 1 tablet (2.5 mg total) by mouth daily at 12 noon. 15 tablet 0   No current facility-administered medications on file prior to visit.    Allergies  Allergen Reactions  . Oxycodone-Acetaminophen Shortness Of Breath and Nausea Only  . Propoxyphene N-Acetaminophen Shortness Of Breath and Nausea Only  . Sulfonamide Derivatives Shortness Of Breath and Nausea Only  . Codeine Nausea Only  . Latex Rash  . Metoprolol Rash    Family History  Problem Relation Age of Onset  . Heart attack Mother   . Heart disease Father   . Asthma Sister   . Asthma Sister   . Asthma Daughter   . Cancer Maternal Grandfather     prostate  . Cancer Paternal Grandfather     colon    BP 137/88 mmHg  Pulse 87  Temp(Src) 98.8 F (37.1 C) (Oral)  Ht 5\' 4"  (1.626 m)  Wt 171 lb (77.565 kg)  BMI 29.34 kg/m2  Review of Systems She denies hypoglycemia.  Diarrhea is improved, but persists.  Nausea is much better    Objective:   Physical Exam VITAL SIGNS:  See vs page GENERAL: no distress Pulses: dorsalis pedis intact bilat.   MSK: no deformity of the feet CV: 1+ bilat leg edema Skin:  no ulcer on the feet.  normal color and temp on the feet.  There are healed ulcers at the left instep and the right ant tibial area.  Skin on the feet is dry.   Neuro: sensation is intact to touch on the feet, but decreased from normal.  Ext: There is bilateral onychomycosis of the toenails.     A1c=7.0%    Assessment & Plan:  DM: control is apparently much better, since prednisone was tapered.  C. diff, apparently improved, but not resolved.  Jejunitis, uncertain etiology, clinically much better.  We'll follow   Patient is advised the following: Patient Instructions  Please come back for a regular physical appointment in 3 months. You can stop taking the insulin and glimepiride.   check your blood sugar once a day.  vary the time of day when you check, between before the 3 meals, and  at bedtime.  also check if you have symptoms of your blood sugar being too high or too low.  please keep a record of the readings and bring it to your next appointment here.  You can write it on any piece of paper.  please call us sooner if your blood sugar goes over 200. i have sent a prescription to your pharmacy, for the c-diff.

## 2014-12-25 NOTE — Patient Instructions (Addendum)
Please come back for a regular physical appointment in 3 months. You can stop taking the insulin and glimepiride.   check your blood sugar once a day.  vary the time of day when you check, between before the 3 meals, and at bedtime.  also check if you have symptoms of your blood sugar being too high or too low.  please keep a record of the readings and bring it to your next appointment here.  You can write it on any piece of paper.  please call us sooner if your blood sugar goes over 200. i have sent a prescription to your pharmacy, for the c-diff.

## 2014-12-25 NOTE — Telephone Encounter (Signed)
#   [phone] °

## 2014-12-25 NOTE — Telephone Encounter (Signed)
We need home safety assessment please.  The plan is ok, except we don't need medical and medication management No need to set vital sign parameters

## 2014-12-25 NOTE — Telephone Encounter (Signed)
Lucile Crater calling she has set up pt for home health The plan is 1 week 1 2 wk 5 1 wk 3  Seeing for medical and medication management Do you want to set vital sign parameters

## 2014-12-26 ENCOUNTER — Telehealth: Payer: Self-pay

## 2014-12-26 ENCOUNTER — Other Ambulatory Visit: Payer: Self-pay | Admitting: Cardiovascular Disease

## 2014-12-26 DIAGNOSIS — K210 Gastro-esophageal reflux disease with esophagitis: Secondary | ICD-10-CM | POA: Diagnosis not present

## 2014-12-26 DIAGNOSIS — R32 Unspecified urinary incontinence: Secondary | ICD-10-CM | POA: Diagnosis not present

## 2014-12-26 DIAGNOSIS — B3781 Candidal esophagitis: Secondary | ICD-10-CM | POA: Diagnosis not present

## 2014-12-26 DIAGNOSIS — E099 Drug or chemical induced diabetes mellitus without complications: Secondary | ICD-10-CM | POA: Diagnosis not present

## 2014-12-26 DIAGNOSIS — I10 Essential (primary) hypertension: Secondary | ICD-10-CM | POA: Diagnosis not present

## 2014-12-26 DIAGNOSIS — M329 Systemic lupus erythematosus, unspecified: Secondary | ICD-10-CM | POA: Diagnosis not present

## 2014-12-26 DIAGNOSIS — M1990 Unspecified osteoarthritis, unspecified site: Secondary | ICD-10-CM | POA: Diagnosis not present

## 2014-12-26 DIAGNOSIS — K529 Noninfective gastroenteritis and colitis, unspecified: Secondary | ICD-10-CM | POA: Diagnosis not present

## 2014-12-26 DIAGNOSIS — F0390 Unspecified dementia without behavioral disturbance: Secondary | ICD-10-CM | POA: Diagnosis not present

## 2014-12-26 DIAGNOSIS — D6861 Antiphospholipid syndrome: Secondary | ICD-10-CM | POA: Diagnosis not present

## 2014-12-26 DIAGNOSIS — M810 Age-related osteoporosis without current pathological fracture: Secondary | ICD-10-CM | POA: Diagnosis not present

## 2014-12-26 DIAGNOSIS — I509 Heart failure, unspecified: Secondary | ICD-10-CM | POA: Diagnosis not present

## 2014-12-26 DIAGNOSIS — R531 Weakness: Secondary | ICD-10-CM | POA: Diagnosis not present

## 2014-12-26 NOTE — Telephone Encounter (Signed)
Pt's daughter called states pt was started on Flagyl 250mg  TID x 5 days.  Pt will start taking today, moved appt to 12/30/14 since starting Flagyl today.

## 2014-12-26 NOTE — Telephone Encounter (Signed)
I contacted Caryl Pina with [facility]. She stated if she did not see the pt for the Medical and medication management she would not have a reason to go into the home. She stated the medication management part is to make sure the pt is taking her meds as prescribed and education on possible adverse side effects. She stated the medical management would be monitoring the comorbitites that she has. Please advise if verbal orders can be given.

## 2014-12-26 NOTE — Telephone Encounter (Signed)
ok 

## 2014-12-26 NOTE — Telephone Encounter (Signed)
Levada Dy with [facility] notified of verbal order.

## 2014-12-30 DIAGNOSIS — M810 Age-related osteoporosis without current pathological fracture: Secondary | ICD-10-CM | POA: Diagnosis not present

## 2014-12-30 DIAGNOSIS — M329 Systemic lupus erythematosus, unspecified: Secondary | ICD-10-CM | POA: Diagnosis not present

## 2014-12-30 DIAGNOSIS — I10 Essential (primary) hypertension: Secondary | ICD-10-CM | POA: Diagnosis not present

## 2014-12-30 DIAGNOSIS — I509 Heart failure, unspecified: Secondary | ICD-10-CM | POA: Diagnosis not present

## 2014-12-30 DIAGNOSIS — F0390 Unspecified dementia without behavioral disturbance: Secondary | ICD-10-CM | POA: Diagnosis not present

## 2014-12-30 DIAGNOSIS — R531 Weakness: Secondary | ICD-10-CM | POA: Diagnosis not present

## 2014-12-30 DIAGNOSIS — K529 Noninfective gastroenteritis and colitis, unspecified: Secondary | ICD-10-CM | POA: Diagnosis not present

## 2014-12-30 DIAGNOSIS — M1990 Unspecified osteoarthritis, unspecified site: Secondary | ICD-10-CM | POA: Diagnosis not present

## 2014-12-30 DIAGNOSIS — K210 Gastro-esophageal reflux disease with esophagitis: Secondary | ICD-10-CM | POA: Diagnosis not present

## 2014-12-30 DIAGNOSIS — R32 Unspecified urinary incontinence: Secondary | ICD-10-CM | POA: Diagnosis not present

## 2014-12-30 DIAGNOSIS — E099 Drug or chemical induced diabetes mellitus without complications: Secondary | ICD-10-CM | POA: Diagnosis not present

## 2014-12-30 DIAGNOSIS — B3781 Candidal esophagitis: Secondary | ICD-10-CM | POA: Diagnosis not present

## 2014-12-30 DIAGNOSIS — D6861 Antiphospholipid syndrome: Secondary | ICD-10-CM | POA: Diagnosis not present

## 2014-12-31 DIAGNOSIS — R531 Weakness: Secondary | ICD-10-CM | POA: Diagnosis not present

## 2014-12-31 DIAGNOSIS — R32 Unspecified urinary incontinence: Secondary | ICD-10-CM | POA: Diagnosis not present

## 2014-12-31 DIAGNOSIS — F0390 Unspecified dementia without behavioral disturbance: Secondary | ICD-10-CM | POA: Diagnosis not present

## 2014-12-31 DIAGNOSIS — E099 Drug or chemical induced diabetes mellitus without complications: Secondary | ICD-10-CM | POA: Diagnosis not present

## 2014-12-31 DIAGNOSIS — K529 Noninfective gastroenteritis and colitis, unspecified: Secondary | ICD-10-CM | POA: Diagnosis not present

## 2014-12-31 DIAGNOSIS — B3781 Candidal esophagitis: Secondary | ICD-10-CM | POA: Diagnosis not present

## 2014-12-31 DIAGNOSIS — K210 Gastro-esophageal reflux disease with esophagitis: Secondary | ICD-10-CM | POA: Diagnosis not present

## 2014-12-31 DIAGNOSIS — M810 Age-related osteoporosis without current pathological fracture: Secondary | ICD-10-CM | POA: Diagnosis not present

## 2014-12-31 DIAGNOSIS — M329 Systemic lupus erythematosus, unspecified: Secondary | ICD-10-CM | POA: Diagnosis not present

## 2014-12-31 DIAGNOSIS — M1990 Unspecified osteoarthritis, unspecified site: Secondary | ICD-10-CM | POA: Diagnosis not present

## 2014-12-31 DIAGNOSIS — D6861 Antiphospholipid syndrome: Secondary | ICD-10-CM | POA: Diagnosis not present

## 2014-12-31 DIAGNOSIS — I509 Heart failure, unspecified: Secondary | ICD-10-CM | POA: Diagnosis not present

## 2014-12-31 DIAGNOSIS — I10 Essential (primary) hypertension: Secondary | ICD-10-CM | POA: Diagnosis not present

## 2015-01-02 DIAGNOSIS — K210 Gastro-esophageal reflux disease with esophagitis: Secondary | ICD-10-CM | POA: Diagnosis not present

## 2015-01-02 DIAGNOSIS — I10 Essential (primary) hypertension: Secondary | ICD-10-CM | POA: Diagnosis not present

## 2015-01-02 DIAGNOSIS — R32 Unspecified urinary incontinence: Secondary | ICD-10-CM | POA: Diagnosis not present

## 2015-01-02 DIAGNOSIS — M810 Age-related osteoporosis without current pathological fracture: Secondary | ICD-10-CM | POA: Diagnosis not present

## 2015-01-02 DIAGNOSIS — E099 Drug or chemical induced diabetes mellitus without complications: Secondary | ICD-10-CM | POA: Diagnosis not present

## 2015-01-02 DIAGNOSIS — I6789 Other cerebrovascular disease: Secondary | ICD-10-CM | POA: Diagnosis not present

## 2015-01-02 DIAGNOSIS — D6861 Antiphospholipid syndrome: Secondary | ICD-10-CM | POA: Diagnosis not present

## 2015-01-02 DIAGNOSIS — K529 Noninfective gastroenteritis and colitis, unspecified: Secondary | ICD-10-CM | POA: Diagnosis not present

## 2015-01-02 DIAGNOSIS — M329 Systemic lupus erythematosus, unspecified: Secondary | ICD-10-CM | POA: Diagnosis not present

## 2015-01-02 DIAGNOSIS — M1990 Unspecified osteoarthritis, unspecified site: Secondary | ICD-10-CM | POA: Diagnosis not present

## 2015-01-02 DIAGNOSIS — F0390 Unspecified dementia without behavioral disturbance: Secondary | ICD-10-CM | POA: Diagnosis not present

## 2015-01-02 DIAGNOSIS — I509 Heart failure, unspecified: Secondary | ICD-10-CM | POA: Diagnosis not present

## 2015-01-02 DIAGNOSIS — B3781 Candidal esophagitis: Secondary | ICD-10-CM | POA: Diagnosis not present

## 2015-01-02 DIAGNOSIS — R531 Weakness: Secondary | ICD-10-CM | POA: Diagnosis not present

## 2015-01-03 DIAGNOSIS — I6789 Other cerebrovascular disease: Secondary | ICD-10-CM | POA: Diagnosis not present

## 2015-01-04 DIAGNOSIS — I6789 Other cerebrovascular disease: Secondary | ICD-10-CM | POA: Diagnosis not present

## 2015-01-05 DIAGNOSIS — I6789 Other cerebrovascular disease: Secondary | ICD-10-CM | POA: Diagnosis not present

## 2015-01-06 DIAGNOSIS — I6789 Other cerebrovascular disease: Secondary | ICD-10-CM | POA: Diagnosis not present

## 2015-01-07 DIAGNOSIS — G8929 Other chronic pain: Secondary | ICD-10-CM | POA: Insufficient documentation

## 2015-01-07 DIAGNOSIS — Z7901 Long term (current) use of anticoagulants: Secondary | ICD-10-CM | POA: Diagnosis not present

## 2015-01-07 DIAGNOSIS — D120 Benign neoplasm of cecum: Secondary | ICD-10-CM | POA: Diagnosis not present

## 2015-01-07 DIAGNOSIS — N3000 Acute cystitis without hematuria: Secondary | ICD-10-CM | POA: Diagnosis not present

## 2015-01-07 DIAGNOSIS — F0390 Unspecified dementia without behavioral disturbance: Secondary | ICD-10-CM | POA: Diagnosis not present

## 2015-01-07 DIAGNOSIS — K529 Noninfective gastroenteritis and colitis, unspecified: Secondary | ICD-10-CM | POA: Diagnosis not present

## 2015-01-07 DIAGNOSIS — K5730 Diverticulosis of large intestine without perforation or abscess without bleeding: Secondary | ICD-10-CM | POA: Diagnosis not present

## 2015-01-07 DIAGNOSIS — R791 Abnormal coagulation profile: Secondary | ICD-10-CM | POA: Diagnosis not present

## 2015-01-07 DIAGNOSIS — N390 Urinary tract infection, site not specified: Secondary | ICD-10-CM | POA: Diagnosis not present

## 2015-01-07 DIAGNOSIS — F329 Major depressive disorder, single episode, unspecified: Secondary | ICD-10-CM | POA: Diagnosis not present

## 2015-01-07 DIAGNOSIS — I509 Heart failure, unspecified: Secondary | ICD-10-CM | POA: Diagnosis not present

## 2015-01-07 DIAGNOSIS — K635 Polyp of colon: Secondary | ICD-10-CM | POA: Diagnosis not present

## 2015-01-07 DIAGNOSIS — D899 Disorder involving the immune mechanism, unspecified: Secondary | ICD-10-CM | POA: Diagnosis not present

## 2015-01-07 DIAGNOSIS — R109 Unspecified abdominal pain: Secondary | ICD-10-CM | POA: Diagnosis not present

## 2015-01-07 DIAGNOSIS — B961 Klebsiella pneumoniae [K. pneumoniae] as the cause of diseases classified elsewhere: Secondary | ICD-10-CM | POA: Diagnosis not present

## 2015-01-07 DIAGNOSIS — Z794 Long term (current) use of insulin: Secondary | ICD-10-CM | POA: Diagnosis not present

## 2015-01-07 DIAGNOSIS — I10 Essential (primary) hypertension: Secondary | ICD-10-CM | POA: Diagnosis not present

## 2015-01-07 DIAGNOSIS — N3941 Urge incontinence: Secondary | ICD-10-CM | POA: Diagnosis not present

## 2015-01-07 DIAGNOSIS — Z91040 Latex allergy status: Secondary | ICD-10-CM | POA: Diagnosis not present

## 2015-01-07 DIAGNOSIS — Z86711 Personal history of pulmonary embolism: Secondary | ICD-10-CM | POA: Diagnosis not present

## 2015-01-07 DIAGNOSIS — M810 Age-related osteoporosis without current pathological fracture: Secondary | ICD-10-CM | POA: Diagnosis not present

## 2015-01-07 DIAGNOSIS — R1010 Upper abdominal pain, unspecified: Secondary | ICD-10-CM | POA: Diagnosis not present

## 2015-01-07 DIAGNOSIS — R3915 Urgency of urination: Secondary | ICD-10-CM | POA: Diagnosis not present

## 2015-01-07 DIAGNOSIS — R197 Diarrhea, unspecified: Secondary | ICD-10-CM | POA: Diagnosis not present

## 2015-01-07 DIAGNOSIS — R030 Elevated blood-pressure reading, without diagnosis of hypertension: Secondary | ICD-10-CM | POA: Diagnosis not present

## 2015-01-07 DIAGNOSIS — R1032 Left lower quadrant pain: Secondary | ICD-10-CM | POA: Diagnosis not present

## 2015-01-07 DIAGNOSIS — D122 Benign neoplasm of ascending colon: Secondary | ICD-10-CM | POA: Diagnosis not present

## 2015-01-07 DIAGNOSIS — R1031 Right lower quadrant pain: Secondary | ICD-10-CM | POA: Diagnosis not present

## 2015-01-07 DIAGNOSIS — E669 Obesity, unspecified: Secondary | ICD-10-CM | POA: Diagnosis not present

## 2015-01-07 DIAGNOSIS — Z940 Kidney transplant status: Secondary | ICD-10-CM | POA: Diagnosis not present

## 2015-01-07 DIAGNOSIS — Z885 Allergy status to narcotic agent status: Secondary | ICD-10-CM | POA: Diagnosis not present

## 2015-01-07 DIAGNOSIS — R1084 Generalized abdominal pain: Secondary | ICD-10-CM | POA: Insufficient documentation

## 2015-01-07 DIAGNOSIS — M329 Systemic lupus erythematosus, unspecified: Secondary | ICD-10-CM | POA: Diagnosis not present

## 2015-01-07 DIAGNOSIS — R000 Tachycardia, unspecified: Secondary | ICD-10-CM | POA: Diagnosis not present

## 2015-01-07 DIAGNOSIS — R9431 Abnormal electrocardiogram [ECG] [EKG]: Secondary | ICD-10-CM | POA: Diagnosis not present

## 2015-01-07 DIAGNOSIS — M109 Gout, unspecified: Secondary | ICD-10-CM | POA: Diagnosis not present

## 2015-01-07 DIAGNOSIS — Z6832 Body mass index (BMI) 32.0-32.9, adult: Secondary | ICD-10-CM | POA: Diagnosis not present

## 2015-01-07 DIAGNOSIS — D124 Benign neoplasm of descending colon: Secondary | ICD-10-CM | POA: Diagnosis not present

## 2015-01-07 DIAGNOSIS — E119 Type 2 diabetes mellitus without complications: Secondary | ICD-10-CM | POA: Diagnosis not present

## 2015-01-07 DIAGNOSIS — Z881 Allergy status to other antibiotic agents status: Secondary | ICD-10-CM | POA: Diagnosis not present

## 2015-01-07 DIAGNOSIS — K5289 Other specified noninfective gastroenteritis and colitis: Secondary | ICD-10-CM | POA: Diagnosis not present

## 2015-01-12 DIAGNOSIS — K529 Noninfective gastroenteritis and colitis, unspecified: Secondary | ICD-10-CM | POA: Diagnosis not present

## 2015-01-14 DIAGNOSIS — I6789 Other cerebrovascular disease: Secondary | ICD-10-CM | POA: Diagnosis not present

## 2015-01-15 DIAGNOSIS — I6789 Other cerebrovascular disease: Secondary | ICD-10-CM | POA: Diagnosis not present

## 2015-01-16 DIAGNOSIS — I6789 Other cerebrovascular disease: Secondary | ICD-10-CM | POA: Diagnosis not present

## 2015-01-17 DIAGNOSIS — M1990 Unspecified osteoarthritis, unspecified site: Secondary | ICD-10-CM | POA: Diagnosis not present

## 2015-01-17 DIAGNOSIS — M810 Age-related osteoporosis without current pathological fracture: Secondary | ICD-10-CM | POA: Diagnosis not present

## 2015-01-17 DIAGNOSIS — I6789 Other cerebrovascular disease: Secondary | ICD-10-CM | POA: Diagnosis not present

## 2015-01-17 DIAGNOSIS — B3781 Candidal esophagitis: Secondary | ICD-10-CM | POA: Diagnosis not present

## 2015-01-17 DIAGNOSIS — K529 Noninfective gastroenteritis and colitis, unspecified: Secondary | ICD-10-CM | POA: Diagnosis not present

## 2015-01-17 DIAGNOSIS — I509 Heart failure, unspecified: Secondary | ICD-10-CM | POA: Diagnosis not present

## 2015-01-17 DIAGNOSIS — F0390 Unspecified dementia without behavioral disturbance: Secondary | ICD-10-CM | POA: Diagnosis not present

## 2015-01-17 DIAGNOSIS — E099 Drug or chemical induced diabetes mellitus without complications: Secondary | ICD-10-CM | POA: Diagnosis not present

## 2015-01-17 DIAGNOSIS — D6861 Antiphospholipid syndrome: Secondary | ICD-10-CM | POA: Diagnosis not present

## 2015-01-17 DIAGNOSIS — R531 Weakness: Secondary | ICD-10-CM | POA: Diagnosis not present

## 2015-01-17 DIAGNOSIS — R32 Unspecified urinary incontinence: Secondary | ICD-10-CM | POA: Diagnosis not present

## 2015-01-17 DIAGNOSIS — M329 Systemic lupus erythematosus, unspecified: Secondary | ICD-10-CM | POA: Diagnosis not present

## 2015-01-17 DIAGNOSIS — K210 Gastro-esophageal reflux disease with esophagitis: Secondary | ICD-10-CM | POA: Diagnosis not present

## 2015-01-17 DIAGNOSIS — I10 Essential (primary) hypertension: Secondary | ICD-10-CM | POA: Diagnosis not present

## 2015-01-18 DIAGNOSIS — I6789 Other cerebrovascular disease: Secondary | ICD-10-CM | POA: Diagnosis not present

## 2015-01-19 DIAGNOSIS — I6789 Other cerebrovascular disease: Secondary | ICD-10-CM | POA: Diagnosis not present

## 2015-01-20 DIAGNOSIS — I6789 Other cerebrovascular disease: Secondary | ICD-10-CM | POA: Diagnosis not present

## 2015-01-21 DIAGNOSIS — E099 Drug or chemical induced diabetes mellitus without complications: Secondary | ICD-10-CM | POA: Diagnosis not present

## 2015-01-21 DIAGNOSIS — M329 Systemic lupus erythematosus, unspecified: Secondary | ICD-10-CM | POA: Diagnosis not present

## 2015-01-21 DIAGNOSIS — K210 Gastro-esophageal reflux disease with esophagitis: Secondary | ICD-10-CM | POA: Diagnosis not present

## 2015-01-21 DIAGNOSIS — R32 Unspecified urinary incontinence: Secondary | ICD-10-CM | POA: Diagnosis not present

## 2015-01-21 DIAGNOSIS — I509 Heart failure, unspecified: Secondary | ICD-10-CM | POA: Diagnosis not present

## 2015-01-21 DIAGNOSIS — K529 Noninfective gastroenteritis and colitis, unspecified: Secondary | ICD-10-CM | POA: Diagnosis not present

## 2015-01-21 DIAGNOSIS — B3781 Candidal esophagitis: Secondary | ICD-10-CM | POA: Diagnosis not present

## 2015-01-21 DIAGNOSIS — I10 Essential (primary) hypertension: Secondary | ICD-10-CM | POA: Diagnosis not present

## 2015-01-21 DIAGNOSIS — M810 Age-related osteoporosis without current pathological fracture: Secondary | ICD-10-CM | POA: Diagnosis not present

## 2015-01-21 DIAGNOSIS — M1990 Unspecified osteoarthritis, unspecified site: Secondary | ICD-10-CM | POA: Diagnosis not present

## 2015-01-21 DIAGNOSIS — R531 Weakness: Secondary | ICD-10-CM | POA: Diagnosis not present

## 2015-01-21 DIAGNOSIS — D6861 Antiphospholipid syndrome: Secondary | ICD-10-CM | POA: Diagnosis not present

## 2015-01-21 DIAGNOSIS — I6789 Other cerebrovascular disease: Secondary | ICD-10-CM | POA: Diagnosis not present

## 2015-01-21 DIAGNOSIS — F0390 Unspecified dementia without behavioral disturbance: Secondary | ICD-10-CM | POA: Diagnosis not present

## 2015-01-22 ENCOUNTER — Telehealth: Payer: Self-pay | Admitting: Endocrinology

## 2015-01-22 DIAGNOSIS — I6789 Other cerebrovascular disease: Secondary | ICD-10-CM | POA: Diagnosis not present

## 2015-01-22 NOTE — Telephone Encounter (Signed)
Please read message below and advise.  

## 2015-01-22 NOTE — Telephone Encounter (Signed)
ok 

## 2015-01-22 NOTE — Telephone Encounter (Signed)
Joelene Millin from [facility] called and would like to get verbal orders   Orders:  Continue therapy 2x week for 4 weeks   Call back: [phone]   Thank you

## 2015-01-23 ENCOUNTER — Telehealth: Payer: Self-pay

## 2015-01-23 ENCOUNTER — Ambulatory Visit: Payer: Medicare Other | Admitting: Endocrinology

## 2015-01-23 DIAGNOSIS — E099 Drug or chemical induced diabetes mellitus without complications: Secondary | ICD-10-CM | POA: Diagnosis not present

## 2015-01-23 DIAGNOSIS — F0390 Unspecified dementia without behavioral disturbance: Secondary | ICD-10-CM | POA: Diagnosis not present

## 2015-01-23 DIAGNOSIS — I6789 Other cerebrovascular disease: Secondary | ICD-10-CM | POA: Diagnosis not present

## 2015-01-23 DIAGNOSIS — R531 Weakness: Secondary | ICD-10-CM | POA: Diagnosis not present

## 2015-01-23 DIAGNOSIS — I509 Heart failure, unspecified: Secondary | ICD-10-CM | POA: Diagnosis not present

## 2015-01-23 DIAGNOSIS — K529 Noninfective gastroenteritis and colitis, unspecified: Secondary | ICD-10-CM | POA: Diagnosis not present

## 2015-01-23 DIAGNOSIS — R32 Unspecified urinary incontinence: Secondary | ICD-10-CM | POA: Diagnosis not present

## 2015-01-23 DIAGNOSIS — M810 Age-related osteoporosis without current pathological fracture: Secondary | ICD-10-CM | POA: Diagnosis not present

## 2015-01-23 DIAGNOSIS — M1990 Unspecified osteoarthritis, unspecified site: Secondary | ICD-10-CM | POA: Diagnosis not present

## 2015-01-23 DIAGNOSIS — I10 Essential (primary) hypertension: Secondary | ICD-10-CM | POA: Diagnosis not present

## 2015-01-23 DIAGNOSIS — D6861 Antiphospholipid syndrome: Secondary | ICD-10-CM | POA: Diagnosis not present

## 2015-01-23 DIAGNOSIS — M329 Systemic lupus erythematosus, unspecified: Secondary | ICD-10-CM | POA: Diagnosis not present

## 2015-01-23 DIAGNOSIS — B3781 Candidal esophagitis: Secondary | ICD-10-CM | POA: Diagnosis not present

## 2015-01-23 DIAGNOSIS — K210 Gastro-esophageal reflux disease with esophagitis: Secondary | ICD-10-CM | POA: Diagnosis not present

## 2015-01-23 NOTE — Telephone Encounter (Signed)
Left voicemail advising verbal orders have been given. Requested call back if need be.

## 2015-01-23 NOTE — Telephone Encounter (Signed)
Number given for therapist is not correct.

## 2015-01-24 ENCOUNTER — Telehealth: Payer: Self-pay | Admitting: Endocrinology

## 2015-01-24 DIAGNOSIS — I6789 Other cerebrovascular disease: Secondary | ICD-10-CM | POA: Diagnosis not present

## 2015-01-24 NOTE — Telephone Encounter (Signed)
Joelene Millin from [facility] called and would like a verbal for Mrs. Beards therapy   Rx: 2x week for 4 weeks  Thank you

## 2015-01-24 NOTE — Telephone Encounter (Signed)
Please advise if a phone number was given.

## 2015-01-24 NOTE — Telephone Encounter (Signed)
My error no there was no contact number given  My apologies    Thank You

## 2015-01-25 DIAGNOSIS — I6789 Other cerebrovascular disease: Secondary | ICD-10-CM | POA: Diagnosis not present

## 2015-01-26 DIAGNOSIS — I6789 Other cerebrovascular disease: Secondary | ICD-10-CM | POA: Diagnosis not present

## 2015-01-27 ENCOUNTER — Ambulatory Visit (INDEPENDENT_AMBULATORY_CARE_PROVIDER_SITE_OTHER): Payer: Self-pay | Admitting: Cardiovascular Disease

## 2015-01-27 DIAGNOSIS — I2699 Other pulmonary embolism without acute cor pulmonale: Secondary | ICD-10-CM

## 2015-01-27 DIAGNOSIS — I639 Cerebral infarction, unspecified: Secondary | ICD-10-CM

## 2015-01-27 DIAGNOSIS — I10 Essential (primary) hypertension: Secondary | ICD-10-CM | POA: Diagnosis not present

## 2015-01-27 DIAGNOSIS — B3781 Candidal esophagitis: Secondary | ICD-10-CM | POA: Diagnosis not present

## 2015-01-27 DIAGNOSIS — F0390 Unspecified dementia without behavioral disturbance: Secondary | ICD-10-CM | POA: Diagnosis not present

## 2015-01-27 DIAGNOSIS — M329 Systemic lupus erythematosus, unspecified: Secondary | ICD-10-CM | POA: Diagnosis not present

## 2015-01-27 DIAGNOSIS — D6861 Antiphospholipid syndrome: Secondary | ICD-10-CM | POA: Diagnosis not present

## 2015-01-27 DIAGNOSIS — R531 Weakness: Secondary | ICD-10-CM | POA: Diagnosis not present

## 2015-01-27 DIAGNOSIS — I6350 Cerebral infarction due to unspecified occlusion or stenosis of unspecified cerebral artery: Secondary | ICD-10-CM

## 2015-01-27 DIAGNOSIS — M1990 Unspecified osteoarthritis, unspecified site: Secondary | ICD-10-CM | POA: Diagnosis not present

## 2015-01-27 DIAGNOSIS — M810 Age-related osteoporosis without current pathological fracture: Secondary | ICD-10-CM | POA: Diagnosis not present

## 2015-01-27 DIAGNOSIS — K210 Gastro-esophageal reflux disease with esophagitis: Secondary | ICD-10-CM | POA: Diagnosis not present

## 2015-01-27 DIAGNOSIS — E099 Drug or chemical induced diabetes mellitus without complications: Secondary | ICD-10-CM | POA: Diagnosis not present

## 2015-01-27 DIAGNOSIS — I509 Heart failure, unspecified: Secondary | ICD-10-CM | POA: Diagnosis not present

## 2015-01-27 DIAGNOSIS — K529 Noninfective gastroenteritis and colitis, unspecified: Secondary | ICD-10-CM | POA: Diagnosis not present

## 2015-01-27 DIAGNOSIS — R32 Unspecified urinary incontinence: Secondary | ICD-10-CM | POA: Diagnosis not present

## 2015-01-27 DIAGNOSIS — I6789 Other cerebrovascular disease: Secondary | ICD-10-CM | POA: Diagnosis not present

## 2015-01-27 LAB — POCT INR: INR: 1.1

## 2015-01-27 MED ORDER — ENOXAPARIN SODIUM 80 MG/0.8ML ~~LOC~~ SOLN: 80.0000 mg | Freq: Two times a day (BID) | SUBCUTANEOUS | Status: DC

## 2015-01-27 MED ORDER — WARFARIN SODIUM 5 MG PO TABS: ORAL_TABLET | ORAL | Status: DC

## 2015-01-28 DIAGNOSIS — R531 Weakness: Secondary | ICD-10-CM | POA: Diagnosis not present

## 2015-01-28 DIAGNOSIS — M1990 Unspecified osteoarthritis, unspecified site: Secondary | ICD-10-CM | POA: Diagnosis not present

## 2015-01-28 DIAGNOSIS — K210 Gastro-esophageal reflux disease with esophagitis: Secondary | ICD-10-CM | POA: Diagnosis not present

## 2015-01-28 DIAGNOSIS — I509 Heart failure, unspecified: Secondary | ICD-10-CM | POA: Diagnosis not present

## 2015-01-28 DIAGNOSIS — E099 Drug or chemical induced diabetes mellitus without complications: Secondary | ICD-10-CM | POA: Diagnosis not present

## 2015-01-28 DIAGNOSIS — D6861 Antiphospholipid syndrome: Secondary | ICD-10-CM | POA: Diagnosis not present

## 2015-01-28 DIAGNOSIS — R32 Unspecified urinary incontinence: Secondary | ICD-10-CM | POA: Diagnosis not present

## 2015-01-28 DIAGNOSIS — M810 Age-related osteoporosis without current pathological fracture: Secondary | ICD-10-CM | POA: Diagnosis not present

## 2015-01-28 DIAGNOSIS — B3781 Candidal esophagitis: Secondary | ICD-10-CM | POA: Diagnosis not present

## 2015-01-28 DIAGNOSIS — F0390 Unspecified dementia without behavioral disturbance: Secondary | ICD-10-CM | POA: Diagnosis not present

## 2015-01-28 DIAGNOSIS — I10 Essential (primary) hypertension: Secondary | ICD-10-CM | POA: Diagnosis not present

## 2015-01-28 DIAGNOSIS — I6789 Other cerebrovascular disease: Secondary | ICD-10-CM | POA: Diagnosis not present

## 2015-01-28 DIAGNOSIS — M329 Systemic lupus erythematosus, unspecified: Secondary | ICD-10-CM | POA: Diagnosis not present

## 2015-01-28 DIAGNOSIS — K529 Noninfective gastroenteritis and colitis, unspecified: Secondary | ICD-10-CM | POA: Diagnosis not present

## 2015-01-29 DIAGNOSIS — I6789 Other cerebrovascular disease: Secondary | ICD-10-CM | POA: Diagnosis not present

## 2015-01-30 DIAGNOSIS — B3781 Candidal esophagitis: Secondary | ICD-10-CM | POA: Diagnosis not present

## 2015-01-30 DIAGNOSIS — R531 Weakness: Secondary | ICD-10-CM | POA: Diagnosis not present

## 2015-01-30 DIAGNOSIS — E099 Drug or chemical induced diabetes mellitus without complications: Secondary | ICD-10-CM | POA: Diagnosis not present

## 2015-01-30 DIAGNOSIS — I10 Essential (primary) hypertension: Secondary | ICD-10-CM | POA: Diagnosis not present

## 2015-01-30 DIAGNOSIS — R32 Unspecified urinary incontinence: Secondary | ICD-10-CM | POA: Diagnosis not present

## 2015-01-30 DIAGNOSIS — M329 Systemic lupus erythematosus, unspecified: Secondary | ICD-10-CM | POA: Diagnosis not present

## 2015-01-30 DIAGNOSIS — K210 Gastro-esophageal reflux disease with esophagitis: Secondary | ICD-10-CM | POA: Diagnosis not present

## 2015-01-30 DIAGNOSIS — M810 Age-related osteoporosis without current pathological fracture: Secondary | ICD-10-CM | POA: Diagnosis not present

## 2015-01-30 DIAGNOSIS — M1990 Unspecified osteoarthritis, unspecified site: Secondary | ICD-10-CM | POA: Diagnosis not present

## 2015-01-30 DIAGNOSIS — I509 Heart failure, unspecified: Secondary | ICD-10-CM | POA: Diagnosis not present

## 2015-01-30 DIAGNOSIS — K529 Noninfective gastroenteritis and colitis, unspecified: Secondary | ICD-10-CM | POA: Diagnosis not present

## 2015-01-30 DIAGNOSIS — F0390 Unspecified dementia without behavioral disturbance: Secondary | ICD-10-CM | POA: Diagnosis not present

## 2015-01-30 DIAGNOSIS — D6861 Antiphospholipid syndrome: Secondary | ICD-10-CM | POA: Diagnosis not present

## 2015-01-31 ENCOUNTER — Ambulatory Visit (INDEPENDENT_AMBULATORY_CARE_PROVIDER_SITE_OTHER): Payer: Self-pay | Admitting: Cardiology

## 2015-01-31 DIAGNOSIS — M329 Systemic lupus erythematosus, unspecified: Secondary | ICD-10-CM | POA: Diagnosis not present

## 2015-01-31 DIAGNOSIS — B3781 Candidal esophagitis: Secondary | ICD-10-CM | POA: Diagnosis not present

## 2015-01-31 DIAGNOSIS — I509 Heart failure, unspecified: Secondary | ICD-10-CM | POA: Diagnosis not present

## 2015-01-31 DIAGNOSIS — I639 Cerebral infarction, unspecified: Secondary | ICD-10-CM

## 2015-01-31 DIAGNOSIS — I10 Essential (primary) hypertension: Secondary | ICD-10-CM | POA: Diagnosis not present

## 2015-01-31 DIAGNOSIS — E099 Drug or chemical induced diabetes mellitus without complications: Secondary | ICD-10-CM | POA: Diagnosis not present

## 2015-01-31 DIAGNOSIS — K529 Noninfective gastroenteritis and colitis, unspecified: Secondary | ICD-10-CM | POA: Diagnosis not present

## 2015-01-31 DIAGNOSIS — R531 Weakness: Secondary | ICD-10-CM | POA: Diagnosis not present

## 2015-01-31 DIAGNOSIS — M1990 Unspecified osteoarthritis, unspecified site: Secondary | ICD-10-CM | POA: Diagnosis not present

## 2015-01-31 DIAGNOSIS — M810 Age-related osteoporosis without current pathological fracture: Secondary | ICD-10-CM | POA: Diagnosis not present

## 2015-01-31 DIAGNOSIS — F0390 Unspecified dementia without behavioral disturbance: Secondary | ICD-10-CM | POA: Diagnosis not present

## 2015-01-31 DIAGNOSIS — D6861 Antiphospholipid syndrome: Secondary | ICD-10-CM | POA: Diagnosis not present

## 2015-01-31 DIAGNOSIS — I6350 Cerebral infarction due to unspecified occlusion or stenosis of unspecified cerebral artery: Secondary | ICD-10-CM

## 2015-01-31 DIAGNOSIS — R32 Unspecified urinary incontinence: Secondary | ICD-10-CM | POA: Diagnosis not present

## 2015-01-31 DIAGNOSIS — I2699 Other pulmonary embolism without acute cor pulmonale: Secondary | ICD-10-CM

## 2015-01-31 DIAGNOSIS — K210 Gastro-esophageal reflux disease with esophagitis: Secondary | ICD-10-CM | POA: Diagnosis not present

## 2015-01-31 LAB — POCT INR: INR: 2.9

## 2015-02-05 DIAGNOSIS — I10 Essential (primary) hypertension: Secondary | ICD-10-CM | POA: Diagnosis not present

## 2015-02-05 DIAGNOSIS — K210 Gastro-esophageal reflux disease with esophagitis: Secondary | ICD-10-CM | POA: Diagnosis not present

## 2015-02-05 DIAGNOSIS — K529 Noninfective gastroenteritis and colitis, unspecified: Secondary | ICD-10-CM | POA: Diagnosis not present

## 2015-02-05 DIAGNOSIS — B3781 Candidal esophagitis: Secondary | ICD-10-CM | POA: Diagnosis not present

## 2015-02-05 DIAGNOSIS — E099 Drug or chemical induced diabetes mellitus without complications: Secondary | ICD-10-CM | POA: Diagnosis not present

## 2015-02-05 DIAGNOSIS — F0390 Unspecified dementia without behavioral disturbance: Secondary | ICD-10-CM | POA: Diagnosis not present

## 2015-02-05 DIAGNOSIS — M329 Systemic lupus erythematosus, unspecified: Secondary | ICD-10-CM | POA: Diagnosis not present

## 2015-02-05 DIAGNOSIS — R531 Weakness: Secondary | ICD-10-CM | POA: Diagnosis not present

## 2015-02-05 DIAGNOSIS — R32 Unspecified urinary incontinence: Secondary | ICD-10-CM | POA: Diagnosis not present

## 2015-02-05 DIAGNOSIS — M810 Age-related osteoporosis without current pathological fracture: Secondary | ICD-10-CM | POA: Diagnosis not present

## 2015-02-05 DIAGNOSIS — M1990 Unspecified osteoarthritis, unspecified site: Secondary | ICD-10-CM | POA: Diagnosis not present

## 2015-02-05 DIAGNOSIS — I509 Heart failure, unspecified: Secondary | ICD-10-CM | POA: Diagnosis not present

## 2015-02-05 DIAGNOSIS — D6861 Antiphospholipid syndrome: Secondary | ICD-10-CM | POA: Diagnosis not present

## 2015-02-06 ENCOUNTER — Other Ambulatory Visit: Payer: Self-pay

## 2015-02-06 MED ORDER — LOSARTAN POTASSIUM 100 MG PO TABS: 100.0000 mg | ORAL_TABLET | Freq: Every day | ORAL | Status: DC

## 2015-02-07 ENCOUNTER — Ambulatory Visit (INDEPENDENT_AMBULATORY_CARE_PROVIDER_SITE_OTHER): Payer: Self-pay | Admitting: Cardiovascular Disease

## 2015-02-07 DIAGNOSIS — M1990 Unspecified osteoarthritis, unspecified site: Secondary | ICD-10-CM | POA: Diagnosis not present

## 2015-02-07 DIAGNOSIS — R531 Weakness: Secondary | ICD-10-CM | POA: Diagnosis not present

## 2015-02-07 DIAGNOSIS — D6861 Antiphospholipid syndrome: Secondary | ICD-10-CM | POA: Diagnosis not present

## 2015-02-07 DIAGNOSIS — I509 Heart failure, unspecified: Secondary | ICD-10-CM | POA: Diagnosis not present

## 2015-02-07 DIAGNOSIS — M810 Age-related osteoporosis without current pathological fracture: Secondary | ICD-10-CM | POA: Diagnosis not present

## 2015-02-07 DIAGNOSIS — F0390 Unspecified dementia without behavioral disturbance: Secondary | ICD-10-CM | POA: Diagnosis not present

## 2015-02-07 DIAGNOSIS — M329 Systemic lupus erythematosus, unspecified: Secondary | ICD-10-CM | POA: Diagnosis not present

## 2015-02-07 DIAGNOSIS — I2699 Other pulmonary embolism without acute cor pulmonale: Secondary | ICD-10-CM

## 2015-02-07 DIAGNOSIS — I6350 Cerebral infarction due to unspecified occlusion or stenosis of unspecified cerebral artery: Secondary | ICD-10-CM

## 2015-02-07 DIAGNOSIS — I10 Essential (primary) hypertension: Secondary | ICD-10-CM | POA: Diagnosis not present

## 2015-02-07 DIAGNOSIS — R32 Unspecified urinary incontinence: Secondary | ICD-10-CM | POA: Diagnosis not present

## 2015-02-07 DIAGNOSIS — I639 Cerebral infarction, unspecified: Secondary | ICD-10-CM

## 2015-02-07 DIAGNOSIS — K210 Gastro-esophageal reflux disease with esophagitis: Secondary | ICD-10-CM | POA: Diagnosis not present

## 2015-02-07 DIAGNOSIS — E099 Drug or chemical induced diabetes mellitus without complications: Secondary | ICD-10-CM | POA: Diagnosis not present

## 2015-02-07 DIAGNOSIS — B3781 Candidal esophagitis: Secondary | ICD-10-CM | POA: Diagnosis not present

## 2015-02-07 DIAGNOSIS — K529 Noninfective gastroenteritis and colitis, unspecified: Secondary | ICD-10-CM | POA: Diagnosis not present

## 2015-02-07 LAB — POCT INR: INR: 5.3

## 2015-02-11 ENCOUNTER — Other Ambulatory Visit: Payer: Self-pay | Admitting: Endocrinology

## 2015-02-11 DIAGNOSIS — B3781 Candidal esophagitis: Secondary | ICD-10-CM | POA: Diagnosis not present

## 2015-02-11 DIAGNOSIS — R531 Weakness: Secondary | ICD-10-CM | POA: Diagnosis not present

## 2015-02-11 DIAGNOSIS — M329 Systemic lupus erythematosus, unspecified: Secondary | ICD-10-CM | POA: Diagnosis not present

## 2015-02-11 DIAGNOSIS — K210 Gastro-esophageal reflux disease with esophagitis: Secondary | ICD-10-CM | POA: Diagnosis not present

## 2015-02-11 DIAGNOSIS — M1990 Unspecified osteoarthritis, unspecified site: Secondary | ICD-10-CM | POA: Diagnosis not present

## 2015-02-11 DIAGNOSIS — I10 Essential (primary) hypertension: Secondary | ICD-10-CM | POA: Diagnosis not present

## 2015-02-11 DIAGNOSIS — K529 Noninfective gastroenteritis and colitis, unspecified: Secondary | ICD-10-CM | POA: Diagnosis not present

## 2015-02-11 DIAGNOSIS — M810 Age-related osteoporosis without current pathological fracture: Secondary | ICD-10-CM | POA: Diagnosis not present

## 2015-02-11 DIAGNOSIS — D6861 Antiphospholipid syndrome: Secondary | ICD-10-CM | POA: Diagnosis not present

## 2015-02-11 DIAGNOSIS — R32 Unspecified urinary incontinence: Secondary | ICD-10-CM | POA: Diagnosis not present

## 2015-02-11 DIAGNOSIS — F0390 Unspecified dementia without behavioral disturbance: Secondary | ICD-10-CM | POA: Diagnosis not present

## 2015-02-11 DIAGNOSIS — E099 Drug or chemical induced diabetes mellitus without complications: Secondary | ICD-10-CM | POA: Diagnosis not present

## 2015-02-11 DIAGNOSIS — I509 Heart failure, unspecified: Secondary | ICD-10-CM | POA: Diagnosis not present

## 2015-02-12 ENCOUNTER — Other Ambulatory Visit: Payer: Self-pay

## 2015-02-12 ENCOUNTER — Telehealth: Payer: Self-pay

## 2015-02-12 DIAGNOSIS — D6861 Antiphospholipid syndrome: Secondary | ICD-10-CM | POA: Diagnosis not present

## 2015-02-12 DIAGNOSIS — K529 Noninfective gastroenteritis and colitis, unspecified: Secondary | ICD-10-CM | POA: Diagnosis not present

## 2015-02-12 DIAGNOSIS — M810 Age-related osteoporosis without current pathological fracture: Secondary | ICD-10-CM | POA: Diagnosis not present

## 2015-02-12 DIAGNOSIS — E099 Drug or chemical induced diabetes mellitus without complications: Secondary | ICD-10-CM | POA: Diagnosis not present

## 2015-02-12 DIAGNOSIS — M1990 Unspecified osteoarthritis, unspecified site: Secondary | ICD-10-CM | POA: Diagnosis not present

## 2015-02-12 DIAGNOSIS — R32 Unspecified urinary incontinence: Secondary | ICD-10-CM | POA: Diagnosis not present

## 2015-02-12 DIAGNOSIS — R531 Weakness: Secondary | ICD-10-CM | POA: Diagnosis not present

## 2015-02-12 DIAGNOSIS — F0390 Unspecified dementia without behavioral disturbance: Secondary | ICD-10-CM | POA: Diagnosis not present

## 2015-02-12 DIAGNOSIS — B3781 Candidal esophagitis: Secondary | ICD-10-CM | POA: Diagnosis not present

## 2015-02-12 DIAGNOSIS — I509 Heart failure, unspecified: Secondary | ICD-10-CM | POA: Diagnosis not present

## 2015-02-12 DIAGNOSIS — K210 Gastro-esophageal reflux disease with esophagitis: Secondary | ICD-10-CM | POA: Diagnosis not present

## 2015-02-12 DIAGNOSIS — M329 Systemic lupus erythematosus, unspecified: Secondary | ICD-10-CM | POA: Diagnosis not present

## 2015-02-12 DIAGNOSIS — I10 Essential (primary) hypertension: Secondary | ICD-10-CM | POA: Diagnosis not present

## 2015-02-12 MED ORDER — GLUCOSE BLOOD VI STRP: ORAL_STRIP | Status: DC

## 2015-02-12 MED ORDER — ONETOUCH VERIO IQ SYSTEM W/DEVICE KIT: PACK | Status: DC

## 2015-02-12 MED ORDER — ONETOUCH DELICA LANCETS 33G MISC: Status: DC

## 2015-02-12 NOTE — Telephone Encounter (Addendum)
Rx for a glucometer has been sent.

## 2015-02-13 ENCOUNTER — Emergency Department (HOSPITAL_COMMUNITY): Payer: Medicare Other

## 2015-02-13 ENCOUNTER — Telehealth: Payer: Self-pay

## 2015-02-13 ENCOUNTER — Other Ambulatory Visit: Payer: Self-pay

## 2015-02-13 ENCOUNTER — Inpatient Hospital Stay (HOSPITAL_COMMUNITY)
Admission: EM | Admit: 2015-02-13 | Discharge: 2015-02-22 | DRG: 872 | Disposition: A | Discharge status: Home Health | Payer: Medicare Other | Attending: Internal Medicine | Admitting: Internal Medicine

## 2015-02-13 ENCOUNTER — Encounter (HOSPITAL_COMMUNITY): Payer: Self-pay | Admitting: Nurse Practitioner

## 2015-02-13 DIAGNOSIS — F419 Anxiety disorder, unspecified: Secondary | ICD-10-CM | POA: Diagnosis present

## 2015-02-13 DIAGNOSIS — E876 Hypokalemia: Secondary | ICD-10-CM | POA: Diagnosis not present

## 2015-02-13 DIAGNOSIS — R739 Hyperglycemia, unspecified: Secondary | ICD-10-CM | POA: Diagnosis present

## 2015-02-13 DIAGNOSIS — R4701 Aphasia: Secondary | ICD-10-CM | POA: Diagnosis present

## 2015-02-13 DIAGNOSIS — Z86718 Personal history of other venous thrombosis and embolism: Secondary | ICD-10-CM | POA: Diagnosis not present

## 2015-02-13 DIAGNOSIS — R112 Nausea with vomiting, unspecified: Secondary | ICD-10-CM | POA: Diagnosis not present

## 2015-02-13 DIAGNOSIS — I472 Ventricular tachycardia: Secondary | ICD-10-CM | POA: Diagnosis present

## 2015-02-13 DIAGNOSIS — E041 Nontoxic single thyroid nodule: Secondary | ICD-10-CM

## 2015-02-13 DIAGNOSIS — Z9049 Acquired absence of other specified parts of digestive tract: Secondary | ICD-10-CM | POA: Diagnosis present

## 2015-02-13 DIAGNOSIS — Z800 Family history of malignant neoplasm of digestive organs: Secondary | ICD-10-CM | POA: Diagnosis not present

## 2015-02-13 DIAGNOSIS — R911 Solitary pulmonary nodule: Secondary | ICD-10-CM

## 2015-02-13 DIAGNOSIS — Z79899 Other long term (current) drug therapy: Secondary | ICD-10-CM

## 2015-02-13 DIAGNOSIS — A419 Sepsis, unspecified organism: Principal | ICD-10-CM | POA: Diagnosis present

## 2015-02-13 DIAGNOSIS — G8929 Other chronic pain: Secondary | ICD-10-CM | POA: Diagnosis present

## 2015-02-13 DIAGNOSIS — R1030 Lower abdominal pain, unspecified: Secondary | ICD-10-CM | POA: Diagnosis not present

## 2015-02-13 DIAGNOSIS — Z7901 Long term (current) use of anticoagulants: Secondary | ICD-10-CM | POA: Diagnosis not present

## 2015-02-13 DIAGNOSIS — E861 Hypovolemia: Secondary | ICD-10-CM | POA: Diagnosis present

## 2015-02-13 DIAGNOSIS — Z7952 Long term (current) use of systemic steroids: Secondary | ICD-10-CM

## 2015-02-13 DIAGNOSIS — I10 Essential (primary) hypertension: Secondary | ICD-10-CM | POA: Diagnosis present

## 2015-02-13 DIAGNOSIS — N390 Urinary tract infection, site not specified: Secondary | ICD-10-CM | POA: Diagnosis present

## 2015-02-13 DIAGNOSIS — I69351 Hemiplegia and hemiparesis following cerebral infarction affecting right dominant side: Secondary | ICD-10-CM | POA: Diagnosis not present

## 2015-02-13 DIAGNOSIS — Z86711 Personal history of pulmonary embolism: Secondary | ICD-10-CM | POA: Diagnosis not present

## 2015-02-13 DIAGNOSIS — T380X5A Adverse effect of glucocorticoids and synthetic analogues, initial encounter: Secondary | ICD-10-CM | POA: Diagnosis not present

## 2015-02-13 DIAGNOSIS — D6861 Antiphospholipid syndrome: Secondary | ICD-10-CM | POA: Diagnosis present

## 2015-02-13 DIAGNOSIS — K219 Gastro-esophageal reflux disease without esophagitis: Secondary | ICD-10-CM | POA: Diagnosis present

## 2015-02-13 DIAGNOSIS — K449 Diaphragmatic hernia without obstruction or gangrene: Secondary | ICD-10-CM | POA: Diagnosis not present

## 2015-02-13 DIAGNOSIS — G459 Transient cerebral ischemic attack, unspecified: Secondary | ICD-10-CM | POA: Diagnosis present

## 2015-02-13 DIAGNOSIS — A047 Enterocolitis due to Clostridium difficile: Secondary | ICD-10-CM | POA: Diagnosis present

## 2015-02-13 DIAGNOSIS — I69320 Aphasia following cerebral infarction: Secondary | ICD-10-CM

## 2015-02-13 DIAGNOSIS — M810 Age-related osteoporosis without current pathological fracture: Secondary | ICD-10-CM | POA: Diagnosis present

## 2015-02-13 DIAGNOSIS — M109 Gout, unspecified: Secondary | ICD-10-CM | POA: Diagnosis not present

## 2015-02-13 DIAGNOSIS — K529 Noninfective gastroenteritis and colitis, unspecified: Secondary | ICD-10-CM | POA: Diagnosis not present

## 2015-02-13 DIAGNOSIS — Z940 Kidney transplant status: Secondary | ICD-10-CM

## 2015-02-13 DIAGNOSIS — I493 Ventricular premature depolarization: Secondary | ICD-10-CM | POA: Diagnosis not present

## 2015-02-13 DIAGNOSIS — R197 Diarrhea, unspecified: Secondary | ICD-10-CM

## 2015-02-13 DIAGNOSIS — E1165 Type 2 diabetes mellitus with hyperglycemia: Secondary | ICD-10-CM | POA: Diagnosis present

## 2015-02-13 DIAGNOSIS — Z825 Family history of asthma and other chronic lower respiratory diseases: Secondary | ICD-10-CM

## 2015-02-13 DIAGNOSIS — Z91040 Latex allergy status: Secondary | ICD-10-CM

## 2015-02-13 DIAGNOSIS — I5032 Chronic diastolic (congestive) heart failure: Secondary | ICD-10-CM | POA: Diagnosis not present

## 2015-02-13 DIAGNOSIS — R531 Weakness: Secondary | ICD-10-CM | POA: Diagnosis present

## 2015-02-13 DIAGNOSIS — M329 Systemic lupus erythematosus, unspecified: Secondary | ICD-10-CM | POA: Diagnosis present

## 2015-02-13 DIAGNOSIS — F32A Depression, unspecified: Secondary | ICD-10-CM | POA: Diagnosis present

## 2015-02-13 DIAGNOSIS — E785 Hyperlipidemia, unspecified: Secondary | ICD-10-CM | POA: Diagnosis present

## 2015-02-13 DIAGNOSIS — R109 Unspecified abdominal pain: Secondary | ICD-10-CM | POA: Diagnosis present

## 2015-02-13 DIAGNOSIS — D120 Benign neoplasm of cecum: Secondary | ICD-10-CM | POA: Diagnosis present

## 2015-02-13 DIAGNOSIS — I2699 Other pulmonary embolism without acute cor pulmonale: Secondary | ICD-10-CM | POA: Diagnosis present

## 2015-02-13 DIAGNOSIS — F329 Major depressive disorder, single episode, unspecified: Secondary | ICD-10-CM | POA: Diagnosis present

## 2015-02-13 DIAGNOSIS — Z882 Allergy status to sulfonamides status: Secondary | ICD-10-CM

## 2015-02-13 DIAGNOSIS — Z8249 Family history of ischemic heart disease and other diseases of the circulatory system: Secondary | ICD-10-CM

## 2015-02-13 DIAGNOSIS — Z885 Allergy status to narcotic agent status: Secondary | ICD-10-CM

## 2015-02-13 DIAGNOSIS — K2970 Gastritis, unspecified, without bleeding: Secondary | ICD-10-CM | POA: Diagnosis not present

## 2015-02-13 DIAGNOSIS — R791 Abnormal coagulation profile: Secondary | ICD-10-CM | POA: Diagnosis not present

## 2015-02-13 DIAGNOSIS — Z888 Allergy status to other drugs, medicaments and biological substances status: Secondary | ICD-10-CM

## 2015-02-13 LAB — CBC WITH DIFFERENTIAL/PLATELET
Basophils Absolute: 0 10*3/uL (ref 0.0–0.1)
Basophils Relative: 0 % (ref 0–1)
Eosinophils Absolute: 0 10*3/uL (ref 0.0–0.7)
Eosinophils Relative: 0 % (ref 0–5)
HCT: 38.4 % (ref 36.0–46.0)
Hemoglobin: 12.7 g/dL (ref 12.0–15.0)
Lymphocytes Relative: 34 % (ref 12–46)
Lymphs Abs: 5.3 10*3/uL — ABNORMAL HIGH (ref 0.7–4.0)
MCH: 30.2 pg (ref 26.0–34.0)
MCHC: 33.1 g/dL (ref 30.0–36.0)
MCV: 91.2 fL (ref 78.0–100.0)
Monocytes Absolute: 0.8 10*3/uL (ref 0.1–1.0)
Monocytes Relative: 5 % (ref 3–12)
Neutro Abs: 9.6 10*3/uL — ABNORMAL HIGH (ref 1.7–7.7)
Neutrophils Relative %: 61 % (ref 43–77)
Platelets: 290 10*3/uL (ref 150–400)
RBC: 4.21 MIL/uL (ref 3.87–5.11)
RDW: 13.7 % (ref 11.5–15.5)
WBC: 15.7 10*3/uL — ABNORMAL HIGH (ref 4.0–10.5)

## 2015-02-13 MED ORDER — IOHEXOL 300 MG/ML  SOLN
25.0000 mL | Freq: Once | INTRAMUSCULAR | Status: AC | PRN
  Administered 2015-02-13: 25 mL via ORAL

## 2015-02-13 MED ORDER — ONDANSETRON HCL 4 MG/2ML IJ SOLN
4.0000 mg | Freq: Once | INTRAMUSCULAR | Status: AC
  Administered 2015-02-13: 4 mg via INTRAVENOUS
  Filled 2015-02-13: qty 2

## 2015-02-13 MED ORDER — FENTANYL CITRATE (PF) 100 MCG/2ML IJ SOLN
100.0000 ug | Freq: Once | INTRAMUSCULAR | Status: AC
  Administered 2015-02-13: 100 ug via INTRAVENOUS
  Filled 2015-02-13: qty 2

## 2015-02-13 MED ORDER — SODIUM CHLORIDE 0.9 % IV SOLN
1000.0000 mL | Freq: Once | INTRAVENOUS | Status: AC
  Administered 2015-02-13: 1000 mL via INTRAVENOUS

## 2015-02-13 MED ORDER — SODIUM CHLORIDE 0.9 % IV SOLN
1000.0000 mL | INTRAVENOUS | Status: DC
  Administered 2015-02-13 – 2015-02-14 (×2): 1000 mL via INTRAVENOUS

## 2015-02-13 NOTE — Telephone Encounter (Signed)
Physical Therapist Maudie Mercury with [facility] home [facility] called and requested a verbal order for PT to continue 2 times a week for 5 weeks.

## 2015-02-13 NOTE — ED Notes (Signed)
Nurse drawing labs. 

## 2015-02-13 NOTE — Telephone Encounter (Signed)
ok 

## 2015-02-13 NOTE — ED Notes (Signed)
Bed: [id number] Expected date:  Expected time:  Means of arrival:  Comments: 54 yr old, gastritis, lupus

## 2015-02-13 NOTE — ED Provider Notes (Signed)
CSN: [id number]     Arrival date & time 02/13/15  2056 History   First MD Initiated Contact with Patient 02/13/15 2118     Chief Complaint  Patient presents with  . Abdominal Pain  . Weakness  . N/V/D      Patient is a 54 y.o. female presenting with abdominal pain and weakness. The history is provided by the patient.  Abdominal Pain Pain location:  LLQ Pain quality: aching and sharp   Pain radiates to:  Does not radiate Pain severity:  Severe Onset quality:  Gradual Duration:  1 day Timing:  Constant Progression:  Worsening Relieved by:  Nothing Worsened by:  Eating Ineffective treatments:  None tried Associated symptoms: anorexia, cough and diarrhea   Associated symptoms: no dysuria, no fever and no vomiting   Weakness Associated symptoms include abdominal pain.  Pt states she has had trouble with gastritis before but she has not been vomiting with this illness.  Diarrhea started yesterday but the pain started today.     Past Medical History  Diagnosis Date  . THYROID NODULE, LEFT 04/10/2009  . HYPERLIPIDEMIA 08/21/2007  . GOUT 08/21/2007  . HYPERTENSION 08/21/2007    Dr. Andree Elk, [place] 08/21/2007  . CEREBROVASCULAR ACCIDENT, ACUTE 04/15/2010  . GERD 08/21/2007  . RENAL INSUFFICIENCY 08/21/2007  . LUPUS 08/21/2007  . OSTEOPOROSIS 08/21/2007    Rheumatol at [facility]  . DVT, HX OF 08/21/2007  . CLOSTRIDIUM DIFFICILE COLITIS, HX OF 08/21/2007  . KIDNEY TRANSPLANTATION, HX OF 08/22/2007    s/p renal transplant-Dr. Andree Elk, [facility]  . Pulmonary embolism 07/16/2010  . Renal failure   . Current use of long term anticoagulation     Dr. Andree Elk, [facility]  . Depression     Dr. Andree Elk, [facility]  . History of stroke with residual effects   . Right sided weakness   . CVA 04/17/2010  . Tachycardia   . DIABETES MELLITUS, TYPE II 08/21/2007  . Candida esophagitis 11/12/2014  . Steroid-induced hyperglycemia 11/09/2014   Past Surgical History  Procedure Laterality Date  .  Cholecystectomy    . Tubal ligation    . Kidney transplant Right 2009  . Renal biopsy, open  1981  . Enteroscopy N/A 11/11/2014    Procedure: ENTEROSCOPY;  Surgeon: Ladene Artist, MD;  Location: [facility];  Service: Endoscopy;  Laterality: N/A;   Family History  Problem Relation Age of Onset  . Heart attack Mother   . Heart disease Father   . Asthma Sister   . Asthma Sister   . Asthma Daughter   . Cancer Maternal Grandfather     prostate  . Cancer Paternal Grandfather     colon   Social History  Substance Use Topics  . Smoking status: Never Smoker   . Smokeless tobacco: Never Used  . Alcohol Use: No   OB History    No data available     Review of Systems  Constitutional: Negative for fever.  Respiratory: Positive for cough.   Gastrointestinal: Positive for abdominal pain, diarrhea and anorexia. Negative for vomiting.  Genitourinary: Negative for dysuria.  Neurological: Positive for weakness.      Allergies  Oxycodone-acetaminophen; Propoxyphene n-acetaminophen; Sulfonamide derivatives; Codeine; Latex; and Metoprolol  Home Medications   Prior to Admission medications   Medication Sig Start Date End Date Taking? Authorizing Provider  alendronate (FOSAMAX) 70 MG tablet Take 70 mg by mouth once a week. Take with a full glass of water on an empty stomach.  Yes Historical Provider, MD  calcitRIOL (ROCALTROL) 0.25 MCG capsule TAKE ONE CAPSULE BY MOUTH DAILY 02/11/15  Yes Renato Shin, MD  diltiazem (TIAZAC) 240 MG 24 hr capsule Take 1 capsule (240 mg total) by mouth daily. 10/04/14  Yes Renato Shin, MD  esomeprazole (NEXIUM) 20 MG capsule TAKE ONE CAPSULE BY MOUTH EVERY MORNING BEFORE BREAKFAST 02/13/14  Yes Renato Shin, MD  losartan (COZAAR) 100 MG tablet Take 1 tablet (100 mg total) by mouth daily. 02/06/15  Yes Renato Shin, MD  mycophenolate (CELLCEPT) 250 MG capsule Take 250 mg by mouth 2 (two) times daily.   Yes Historical Provider, MD  oxybutynin (DITROPAN) 5 MG  tablet Take 5 mg by mouth 3 (three) times daily.   Yes Historical Provider, MD  predniSONE (DELTASONE) 5 MG tablet Take 5 mg by mouth daily with breakfast.   Yes Historical Provider, MD  sertraline (ZOLOFT) 100 MG tablet Take 1 tablet (100 mg total) by mouth daily. 10/04/14  Yes Renato Shin, MD  tacrolimus (PROGRAF) 1 MG capsule Take 1 mg by mouth 2 (two) times daily.  04/16/14  Yes Historical Provider, MD  warfarin (COUMADIN) 5 MG tablet Take 1 to 2 tablets by mouth daily as directed by coumadin clinic Patient taking differently: Take 5 mg by mouth.  01/27/15  Yes Lorretta Harp, MD  ammonium lactate (AMLACTIN) 12 % cream Apply topically as needed for dry skin.    Historical Provider, MD  b complex-vitamin c-folic acid (NEPHRO-VITE) 0.8 MG TABS tablet Take 1 tablet by mouth at bedtime.    Historical Provider, MD  Blood Glucose Monitoring Suppl (ONETOUCH VERIO IQ SYSTEM) W/DEVICE KIT Use to check blood sugar 1 time per day 02/12/15   Renato Shin, MD  Cholecalciferol (VITAMIN D-3 PO) Take by mouth.    Historical Provider, MD  enoxaparin (LOVENOX) 80 MG/0.8ML injection Inject 0.8 mLs (80 mg total) into the skin every 12 (twelve) hours. Patient not taking: Reported on 02/13/2015 01/27/15   Lorretta Harp, MD  folic acid (FOLVITE) 1 MG tablet Take 1 mg by mouth daily.    Historical Provider, MD  glucose blood (ONE TOUCH ULTRA TEST) test strip Use to check blood sugar 1 time per day 02/12/15   Renato Shin, MD  nystatin (MYCOSTATIN) powder Apply 1 g topically 4 (four) times daily as needed. For yeast under breast    Historical Provider, MD  ondansetron (ZOFRAN ODT) 4 MG disintegrating tablet 52m ODT q4 hours prn nausea/vomiting Patient taking differently: Take 4 mg by mouth every 8 (eight) hours as needed for nausea.  06/28/14   SSherwood Gambler MD  [facility] Center[facility]ICA LANCETS 381EMISC Use to check blood sugar 1 time per day 02/12/15   SRenato Shin MD  potassium chloride (K-DUR) 10 MEQ tablet Take 1 tablet (10  mEq total) by mouth daily. Patient not taking: Reported on 02/13/2015 04/07/14   SRenato Shin MD  predniSONE (DELTASONE) 20 MG tablet Take 1 tablet (20 mg total) by mouth daily with breakfast. Take 2 tablets by mouth for the next 3 days then take 1 tablet by mouth for the next 3 days then take your regular home regimen dose of prednisone prior to this admission Patient not taking: Reported on 02/13/2015 11/14/14   OVelvet Bathe MD  triamcinolone (KENALOG) 0.025 % cream Apply 1 application topically daily as needed (dry skin).     Historical Provider, MD   BP 148/88 mmHg  Pulse 89  Temp(Src) 98.4 F (36.9 C) (Oral)  Resp 14  SpO2 100% Physical Exam  Constitutional: She appears well-nourished. No distress.  HENT:  Head: Normocephalic and atraumatic.  Right Ear: External ear normal.  Left Ear: External ear normal.  Eyes: Conjunctivae are normal. Right eye exhibits no discharge. Left eye exhibits no discharge. No scleral icterus.  Neck: Neck supple. No tracheal deviation present.  Cardiovascular: Normal rate, regular rhythm and intact distal pulses.   Pulmonary/Chest: Effort normal and breath sounds normal. No stridor. No respiratory distress. She has no wheezes. She has no rales.  Abdominal: Soft. Bowel sounds are normal. She exhibits no distension. There is tenderness in the right lower quadrant and left lower quadrant. There is guarding. There is no rigidity and no rebound. No hernia.  Musculoskeletal: She exhibits no edema or tenderness.  Neurological: She is alert. She has normal strength. No cranial nerve deficit (no facial droop, extraocular movements intact, no slurred speech) or sensory deficit. She exhibits normal muscle tone. She displays no seizure activity. Coordination normal.  Mild aphasia   Skin: Skin is warm and dry. No rash noted.  Psychiatric: She has a normal mood and affect.  Nursing note and vitals reviewed.   ED Course  Procedures (including critical care time) Labs  Review Labs Reviewed  CBC WITH DIFFERENTIAL/PLATELET - Abnormal; Notable for the following:    WBC 15.7 (*)    Neutro Abs 9.6 (*)    Lymphs Abs 5.3 (*)    All other components within normal limits  COMPREHENSIVE METABOLIC PANEL  LIPASE, BLOOD  URINALYSIS, ROUTINE W REFLEX MICROSCOPIC (NOT AT [facility])     MDM  Pt has vomiting, diarrhea and abdominal pain.  TTP on exam.  Will check labs, plan on CT scan.  IV fluids and pain meds     Dorie Rank, MD 02/13/15 2350

## 2015-02-13 NOTE — ED Notes (Signed)
Pt is presented from home, c/o generalized weakness with abdominal pain 10/10, n/v/d x 6 in the last 24 hrs.

## 2015-02-13 NOTE — ED Notes (Signed)
Medication and blood work delayed due to not able to access IV.  Another nurse was attempting IV

## 2015-02-13 NOTE — Telephone Encounter (Signed)
Physical Therapist given verbal ok.

## 2015-02-13 NOTE — ED Notes (Signed)
Pt wants blood drawn when IV is started

## 2015-02-14 ENCOUNTER — Encounter (HOSPITAL_COMMUNITY): Payer: Self-pay

## 2015-02-14 ENCOUNTER — Telehealth: Payer: Self-pay | Admitting: Endocrinology

## 2015-02-14 ENCOUNTER — Inpatient Hospital Stay (HOSPITAL_COMMUNITY): Payer: Medicare Other

## 2015-02-14 DIAGNOSIS — D6861 Antiphospholipid syndrome: Secondary | ICD-10-CM | POA: Diagnosis present

## 2015-02-14 DIAGNOSIS — R109 Unspecified abdominal pain: Secondary | ICD-10-CM | POA: Diagnosis present

## 2015-02-14 DIAGNOSIS — Z8249 Family history of ischemic heart disease and other diseases of the circulatory system: Secondary | ICD-10-CM | POA: Diagnosis not present

## 2015-02-14 DIAGNOSIS — N390 Urinary tract infection, site not specified: Secondary | ICD-10-CM

## 2015-02-14 DIAGNOSIS — K219 Gastro-esophageal reflux disease without esophagitis: Secondary | ICD-10-CM | POA: Diagnosis present

## 2015-02-14 DIAGNOSIS — I10 Essential (primary) hypertension: Secondary | ICD-10-CM

## 2015-02-14 DIAGNOSIS — A419 Sepsis, unspecified organism: Secondary | ICD-10-CM | POA: Diagnosis present

## 2015-02-14 DIAGNOSIS — R911 Solitary pulmonary nodule: Secondary | ICD-10-CM | POA: Diagnosis not present

## 2015-02-14 DIAGNOSIS — Z940 Kidney transplant status: Secondary | ICD-10-CM

## 2015-02-14 DIAGNOSIS — Z9049 Acquired absence of other specified parts of digestive tract: Secondary | ICD-10-CM | POA: Diagnosis present

## 2015-02-14 DIAGNOSIS — M810 Age-related osteoporosis without current pathological fracture: Secondary | ICD-10-CM | POA: Diagnosis present

## 2015-02-14 DIAGNOSIS — D120 Benign neoplasm of cecum: Secondary | ICD-10-CM | POA: Diagnosis present

## 2015-02-14 DIAGNOSIS — G8929 Other chronic pain: Secondary | ICD-10-CM | POA: Diagnosis present

## 2015-02-14 DIAGNOSIS — Z91040 Latex allergy status: Secondary | ICD-10-CM | POA: Diagnosis not present

## 2015-02-14 DIAGNOSIS — I69351 Hemiplegia and hemiparesis following cerebral infarction affecting right dominant side: Secondary | ICD-10-CM | POA: Diagnosis not present

## 2015-02-14 DIAGNOSIS — R197 Diarrhea, unspecified: Secondary | ICD-10-CM

## 2015-02-14 DIAGNOSIS — E876 Hypokalemia: Secondary | ICD-10-CM | POA: Diagnosis not present

## 2015-02-14 DIAGNOSIS — I2699 Other pulmonary embolism without acute cor pulmonale: Secondary | ICD-10-CM

## 2015-02-14 DIAGNOSIS — M150 Primary generalized (osteo)arthritis: Secondary | ICD-10-CM | POA: Diagnosis not present

## 2015-02-14 DIAGNOSIS — F329 Major depressive disorder, single episode, unspecified: Secondary | ICD-10-CM | POA: Diagnosis not present

## 2015-02-14 DIAGNOSIS — E861 Hypovolemia: Secondary | ICD-10-CM | POA: Diagnosis present

## 2015-02-14 DIAGNOSIS — E785 Hyperlipidemia, unspecified: Secondary | ICD-10-CM | POA: Diagnosis present

## 2015-02-14 DIAGNOSIS — R4701 Aphasia: Secondary | ICD-10-CM

## 2015-02-14 DIAGNOSIS — M109 Gout, unspecified: Secondary | ICD-10-CM | POA: Diagnosis present

## 2015-02-14 DIAGNOSIS — Z882 Allergy status to sulfonamides status: Secondary | ICD-10-CM | POA: Diagnosis not present

## 2015-02-14 DIAGNOSIS — Z452 Encounter for adjustment and management of vascular access device: Secondary | ICD-10-CM | POA: Diagnosis not present

## 2015-02-14 DIAGNOSIS — Z86718 Personal history of other venous thrombosis and embolism: Secondary | ICD-10-CM | POA: Diagnosis not present

## 2015-02-14 DIAGNOSIS — M329 Systemic lupus erythematosus, unspecified: Secondary | ICD-10-CM | POA: Diagnosis present

## 2015-02-14 DIAGNOSIS — Z800 Family history of malignant neoplasm of digestive organs: Secondary | ICD-10-CM | POA: Diagnosis not present

## 2015-02-14 DIAGNOSIS — E042 Nontoxic multinodular goiter: Secondary | ICD-10-CM | POA: Diagnosis not present

## 2015-02-14 DIAGNOSIS — A047 Enterocolitis due to Clostridium difficile: Secondary | ICD-10-CM | POA: Diagnosis present

## 2015-02-14 DIAGNOSIS — I472 Ventricular tachycardia: Secondary | ICD-10-CM | POA: Diagnosis not present

## 2015-02-14 DIAGNOSIS — Z7952 Long term (current) use of systemic steroids: Secondary | ICD-10-CM | POA: Diagnosis not present

## 2015-02-14 DIAGNOSIS — R791 Abnormal coagulation profile: Secondary | ICD-10-CM | POA: Diagnosis not present

## 2015-02-14 DIAGNOSIS — K529 Noninfective gastroenteritis and colitis, unspecified: Secondary | ICD-10-CM | POA: Diagnosis present

## 2015-02-14 DIAGNOSIS — R112 Nausea with vomiting, unspecified: Secondary | ICD-10-CM | POA: Diagnosis not present

## 2015-02-14 DIAGNOSIS — Z79899 Other long term (current) drug therapy: Secondary | ICD-10-CM | POA: Diagnosis not present

## 2015-02-14 DIAGNOSIS — R1030 Lower abdominal pain, unspecified: Secondary | ICD-10-CM | POA: Diagnosis not present

## 2015-02-14 DIAGNOSIS — Z86711 Personal history of pulmonary embolism: Secondary | ICD-10-CM | POA: Diagnosis not present

## 2015-02-14 DIAGNOSIS — E1165 Type 2 diabetes mellitus with hyperglycemia: Secondary | ICD-10-CM | POA: Diagnosis present

## 2015-02-14 DIAGNOSIS — I493 Ventricular premature depolarization: Secondary | ICD-10-CM | POA: Diagnosis not present

## 2015-02-14 DIAGNOSIS — Z825 Family history of asthma and other chronic lower respiratory diseases: Secondary | ICD-10-CM | POA: Diagnosis not present

## 2015-02-14 DIAGNOSIS — I5032 Chronic diastolic (congestive) heart failure: Secondary | ICD-10-CM | POA: Diagnosis not present

## 2015-02-14 DIAGNOSIS — T380X5A Adverse effect of glucocorticoids and synthetic analogues, initial encounter: Secondary | ICD-10-CM | POA: Diagnosis present

## 2015-02-14 DIAGNOSIS — Z7901 Long term (current) use of anticoagulants: Secondary | ICD-10-CM | POA: Diagnosis not present

## 2015-02-14 DIAGNOSIS — Z888 Allergy status to other drugs, medicaments and biological substances status: Secondary | ICD-10-CM | POA: Diagnosis not present

## 2015-02-14 DIAGNOSIS — F419 Anxiety disorder, unspecified: Secondary | ICD-10-CM | POA: Diagnosis present

## 2015-02-14 DIAGNOSIS — I69320 Aphasia following cerebral infarction: Secondary | ICD-10-CM | POA: Diagnosis not present

## 2015-02-14 DIAGNOSIS — Z885 Allergy status to narcotic agent status: Secondary | ICD-10-CM | POA: Diagnosis not present

## 2015-02-14 DIAGNOSIS — K449 Diaphragmatic hernia without obstruction or gangrene: Secondary | ICD-10-CM | POA: Diagnosis not present

## 2015-02-14 LAB — BASIC METABOLIC PANEL
Anion gap: 8 (ref 5–15)
BUN: 11 mg/dL (ref 6–20)
CO2: 19 mmol/L — ABNORMAL LOW (ref 22–32)
Calcium: 8.2 mg/dL — ABNORMAL LOW (ref 8.9–10.3)
Chloride: 113 mmol/L — ABNORMAL HIGH (ref 101–111)
Creatinine, Ser: 0.7 mg/dL (ref 0.44–1.00)
GFR calc Af Amer: >60 mL/min (ref >60)
GFR calc non Af Amer: >60 mL/min (ref >60)
Glucose, Bld: 99 mg/dL (ref 65–99)
Potassium: 3.3 mmol/L — ABNORMAL LOW (ref 3.5–5.1)
Sodium: 140 mmol/L (ref 135–145)

## 2015-02-14 LAB — URINALYSIS, ROUTINE W REFLEX MICROSCOPIC
Glucose, UA: NEGATIVE mg/dL
Hgb urine dipstick: NEGATIVE
Ketones, ur: 40 mg/dL — AB
Nitrite: NEGATIVE
Protein, ur: 30 mg/dL — AB
Specific Gravity, Urine: 1.027 (ref 1.005–1.030)
Urobilinogen, UA: 1 mg/dL (ref 0.0–1.0)
pH: 6.5 (ref 5.0–8.0)

## 2015-02-14 LAB — COMPREHENSIVE METABOLIC PANEL
ALT: 19 U/L (ref 14–54)
AST: 28 U/L (ref 15–41)
Albumin: 3.8 g/dL (ref 3.5–5.0)
Alkaline Phosphatase: 67 U/L (ref 38–126)
Anion gap: 10 (ref 5–15)
BUN: 13 mg/dL (ref 6–20)
CO2: 21 mmol/L — ABNORMAL LOW (ref 22–32)
Calcium: 9.4 mg/dL (ref 8.9–10.3)
Chloride: 109 mmol/L (ref 101–111)
Creatinine, Ser: 0.67 mg/dL (ref 0.44–1.00)
GFR calc Af Amer: >60 mL/min (ref >60)
GFR calc non Af Amer: >60 mL/min (ref >60)
Glucose, Bld: 152 mg/dL — ABNORMAL HIGH (ref 65–99)
Potassium: 3.4 mmol/L — ABNORMAL LOW (ref 3.5–5.1)
Sodium: 140 mmol/L (ref 135–145)
Total Bilirubin: 0.6 mg/dL (ref 0.3–1.2)
Total Protein: 7.3 g/dL (ref 6.5–8.1)

## 2015-02-14 LAB — CBC
HCT: 38.3 % (ref 36.0–46.0)
Hemoglobin: 12.2 g/dL (ref 12.0–15.0)
MCH: 29.4 pg (ref 26.0–34.0)
MCHC: 31.9 g/dL (ref 30.0–36.0)
MCV: 92.3 fL (ref 78.0–100.0)
Platelets: 211 10*3/uL (ref 150–400)
RBC: 4.15 MIL/uL (ref 3.87–5.11)
RDW: 14 % (ref 11.5–15.5)
WBC: 10.1 10*3/uL (ref 4.0–10.5)

## 2015-02-14 LAB — URINE MICROSCOPIC-ADD ON

## 2015-02-14 LAB — MAGNESIUM: Magnesium: 1 mg/dL — ABNORMAL LOW (ref 1.7–2.4)

## 2015-02-14 LAB — LACTIC ACID, PLASMA: Lactic Acid, Venous: 0.6 mmol/L (ref 0.5–2.0)

## 2015-02-14 LAB — APTT: aPTT: 70 seconds — ABNORMAL HIGH (ref 24–37)

## 2015-02-14 LAB — CORTISOL-AM, BLOOD: Cortisol - AM: 59.1 ug/dL — ABNORMAL HIGH (ref 6.7–22.6)

## 2015-02-14 LAB — GLUCOSE, CAPILLARY
Glucose-Capillary: 85 mg/dL (ref 65–99)
Glucose-Capillary: 86 mg/dL (ref 65–99)
Glucose-Capillary: 107 mg/dL — ABNORMAL HIGH (ref 65–99)

## 2015-02-14 LAB — PROCALCITONIN: Procalcitonin: <0.1 ng/mL

## 2015-02-14 LAB — PROTIME-INR
INR: 5.89 (ref 0.00–1.49)
Prothrombin Time: 50.8 seconds — ABNORMAL HIGH (ref 11.6–15.2)

## 2015-02-14 LAB — C DIFFICILE QUICK SCREEN W PCR REFLEX
C Diff antigen: POSITIVE — AB
C Diff toxin: NEGATIVE

## 2015-02-14 LAB — LIPASE, BLOOD: Lipase: 21 U/L — ABNORMAL LOW (ref 22–51)

## 2015-02-14 LAB — C-REACTIVE PROTEIN: CRP: 0.9 mg/dL (ref <1.0)

## 2015-02-14 LAB — SEDIMENTATION RATE: Sed Rate: 15 mm/hr (ref 0–22)

## 2015-02-14 LAB — BRAIN NATRIURETIC PEPTIDE: B Natriuretic Peptide: 53 pg/mL (ref 0.0–100.0)

## 2015-02-14 MED ORDER — MYCOPHENOLATE MOFETIL 250 MG PO CAPS
250.0000 mg | ORAL_CAPSULE | Freq: Two times a day (BID) | ORAL | Status: DC
  Administered 2015-02-14 – 2015-02-22 (×17): 250 mg via ORAL
  Filled 2015-02-14 (×18): qty 1

## 2015-02-14 MED ORDER — RENA-VITE PO TABS
1.0000 | ORAL_TABLET | Freq: Every day | ORAL | Status: DC
  Administered 2015-02-14 – 2015-02-21 (×8): 1 via ORAL
  Filled 2015-02-14 (×9): qty 1

## 2015-02-14 MED ORDER — KETOROLAC TROMETHAMINE 30 MG/ML IJ SOLN
30.0000 mg | Freq: Once | INTRAMUSCULAR | Status: AC
  Administered 2015-02-14: 30 mg via INTRAVENOUS
  Filled 2015-02-14: qty 1

## 2015-02-14 MED ORDER — ONDANSETRON HCL 4 MG/2ML IJ SOLN
4.0000 mg | Freq: Once | INTRAMUSCULAR | Status: AC
  Administered 2015-02-14: 4 mg via INTRAVENOUS
  Filled 2015-02-14: qty 2

## 2015-02-14 MED ORDER — AMMONIUM LACTATE 12 % EX LOTN
TOPICAL_LOTION | CUTANEOUS | Status: DC | PRN
  Administered 2015-02-14: 1 via TOPICAL
  Filled 2015-02-14: qty 400

## 2015-02-14 MED ORDER — METRONIDAZOLE IN NACL 5-0.79 MG/ML-% IV SOLN
500.0000 mg | Freq: Once | INTRAVENOUS | Status: DC
  Filled 2015-02-14: qty 100

## 2015-02-14 MED ORDER — CIPROFLOXACIN IN D5W 400 MG/200ML IV SOLN: 400.0000 mg | Freq: Once | INTRAVENOUS | Status: DC

## 2015-02-14 MED ORDER — CHLORHEXIDINE GLUCONATE 0.12 % MT SOLN
15.0000 mL | Freq: Two times a day (BID) | OROMUCOSAL | Status: DC
  Administered 2015-02-14 – 2015-02-22 (×14): 15 mL via OROMUCOSAL
  Filled 2015-02-14 (×17): qty 15

## 2015-02-14 MED ORDER — VITAMIN D 1000 UNITS PO TABS
1000.0000 [IU] | ORAL_TABLET | Freq: Every day | ORAL | Status: DC
  Administered 2015-02-15 – 2015-02-22 (×8): 1000 [IU] via ORAL
  Filled 2015-02-14 (×9): qty 1

## 2015-02-14 MED ORDER — SODIUM CHLORIDE 0.9 % IV SOLN
3.0000 g | INTRAVENOUS | Status: AC
  Administered 2015-02-14: 3 g via INTRAVENOUS
  Filled 2015-02-14: qty 3

## 2015-02-14 MED ORDER — CALCITRIOL 0.25 MCG PO CAPS
0.2500 ug | ORAL_CAPSULE | Freq: Every day | ORAL | Status: DC
  Administered 2015-02-14 – 2015-02-22 (×9): 0.25 ug via ORAL
  Filled 2015-02-14 (×9): qty 1

## 2015-02-14 MED ORDER — SODIUM CHLORIDE 0.9 % IJ SOLN
3.0000 mL | Freq: Two times a day (BID) | INTRAMUSCULAR | Status: DC
  Administered 2015-02-15 – 2015-02-21 (×5): 3 mL via INTRAVENOUS

## 2015-02-14 MED ORDER — FOLIC ACID 1 MG PO TABS
1.0000 mg | ORAL_TABLET | Freq: Every day | ORAL | Status: DC
  Administered 2015-02-15 – 2015-02-22 (×8): 1 mg via ORAL
  Filled 2015-02-14 (×8): qty 1

## 2015-02-14 MED ORDER — NYSTATIN 100000 UNIT/GM EX POWD
1.0000 g | Freq: Four times a day (QID) | CUTANEOUS | Status: DC | PRN
  Filled 2015-02-14: qty 15

## 2015-02-14 MED ORDER — ALENDRONATE SODIUM 70 MG PO TABS: 70.0000 mg | ORAL_TABLET | ORAL | Status: DC

## 2015-02-14 MED ORDER — ONDANSETRON HCL 4 MG/2ML IJ SOLN
4.0000 mg | Freq: Three times a day (TID) | INTRAMUSCULAR | Status: DC | PRN
  Administered 2015-02-14 – 2015-02-21 (×16): 4 mg via INTRAVENOUS
  Filled 2015-02-14 (×18): qty 2

## 2015-02-14 MED ORDER — FAMOTIDINE IN NACL 20-0.9 MG/50ML-% IV SOLN
20.0000 mg | Freq: Two times a day (BID) | INTRAVENOUS | Status: DC
  Administered 2015-02-14 – 2015-02-16 (×4): 20 mg via INTRAVENOUS
  Filled 2015-02-14 (×6): qty 50

## 2015-02-14 MED ORDER — SODIUM CHLORIDE 0.9 % IV BOLUS (SEPSIS): 1000.0000 mL | INTRAVENOUS | Status: DC | PRN

## 2015-02-14 MED ORDER — HYDROCORTISONE NA SUCCINATE PF 100 MG IJ SOLR
50.0000 mg | Freq: Once | INTRAMUSCULAR | Status: AC
  Administered 2015-02-14: 50 mg via INTRAVENOUS
  Filled 2015-02-14: qty 1

## 2015-02-14 MED ORDER — VITAMIN K1 10 MG/ML IJ SOLN
5.0000 mg | Freq: Once | INTRAMUSCULAR | Status: AC
  Administered 2015-02-14: 5 mg via SUBCUTANEOUS
  Filled 2015-02-14: qty 0.5

## 2015-02-14 MED ORDER — SODIUM CHLORIDE 0.9 % IJ SOLN
10.0000 mL | INTRAMUSCULAR | Status: DC | PRN
  Administered 2015-02-14 – 2015-02-15 (×2): 10 mL
  Administered 2015-02-18: 20 mL
  Administered 2015-02-18 – 2015-02-22 (×7): 10 mL
  Filled 2015-02-14 (×9): qty 40

## 2015-02-14 MED ORDER — SODIUM CHLORIDE 0.9 % IV BOLUS (SEPSIS)
1000.0000 mL | Freq: Once | INTRAVENOUS | Status: AC
  Administered 2015-02-14: 1000 mL via INTRAVENOUS

## 2015-02-14 MED ORDER — DILTIAZEM HCL ER BEADS 240 MG PO CP24
240.0000 mg | ORAL_CAPSULE | Freq: Every day | ORAL | Status: DC
  Administered 2015-02-14 – 2015-02-22 (×9): 240 mg via ORAL
  Filled 2015-02-14 (×9): qty 1

## 2015-02-14 MED ORDER — OXYBUTYNIN CHLORIDE 5 MG PO TABS
5.0000 mg | ORAL_TABLET | Freq: Three times a day (TID) | ORAL | Status: DC
  Administered 2015-02-14 – 2015-02-22 (×24): 5 mg via ORAL
  Filled 2015-02-14 (×26): qty 1

## 2015-02-14 MED ORDER — WARFARIN - PHARMACIST DOSING INPATIENT: Freq: Every day | Status: DC

## 2015-02-14 MED ORDER — INSULIN ASPART 100 UNIT/ML ~~LOC~~ SOLN
0.0000 [IU] | SUBCUTANEOUS | Status: DC
  Administered 2015-02-15 – 2015-02-17 (×4): 1 [IU] via SUBCUTANEOUS
  Administered 2015-02-18: 3 [IU] via SUBCUTANEOUS
  Administered 2015-02-18: 2 [IU] via SUBCUTANEOUS
  Administered 2015-02-19: 1 [IU] via SUBCUTANEOUS
  Administered 2015-02-19 – 2015-02-20 (×3): 2 [IU] via SUBCUTANEOUS
  Administered 2015-02-20: 1 [IU] via SUBCUTANEOUS
  Administered 2015-02-21: 2 [IU] via SUBCUTANEOUS
  Administered 2015-02-21: 3 [IU] via SUBCUTANEOUS
  Administered 2015-02-21: 2 [IU] via SUBCUTANEOUS
  Administered 2015-02-22: 1 [IU] via SUBCUTANEOUS

## 2015-02-14 MED ORDER — LOSARTAN POTASSIUM 50 MG PO TABS
100.0000 mg | ORAL_TABLET | Freq: Every day | ORAL | Status: DC
  Administered 2015-02-14: 100 mg via ORAL
  Filled 2015-02-14 (×2): qty 2

## 2015-02-14 MED ORDER — IOHEXOL 300 MG/ML  SOLN
100.0000 mL | Freq: Once | INTRAMUSCULAR | Status: AC | PRN
  Administered 2015-02-14: 100 mL via INTRAVENOUS

## 2015-02-14 MED ORDER — TACROLIMUS 1 MG PO CAPS
1.0000 mg | ORAL_CAPSULE | Freq: Two times a day (BID) | ORAL | Status: DC
  Administered 2015-02-14 – 2015-02-22 (×17): 1 mg via ORAL
  Filled 2015-02-14 (×18): qty 1

## 2015-02-14 MED ORDER — AMPICILLIN-SULBACTAM SODIUM 3 (2-1) G IJ SOLR
3.0000 g | Freq: Four times a day (QID) | INTRAMUSCULAR | Status: DC
  Administered 2015-02-14 – 2015-02-18 (×15): 3 g via INTRAVENOUS
  Filled 2015-02-14 (×18): qty 3

## 2015-02-14 MED ORDER — METRONIDAZOLE IN NACL 5-0.79 MG/ML-% IV SOLN
500.0000 mg | Freq: Three times a day (TID) | INTRAVENOUS | Status: DC
  Administered 2015-02-14 – 2015-02-18 (×12): 500 mg via INTRAVENOUS
  Filled 2015-02-14 (×14): qty 100

## 2015-02-14 MED ORDER — SODIUM CHLORIDE 0.9 % IV SOLN
INTRAVENOUS | Status: DC
  Administered 2015-02-14 – 2015-02-21 (×6): via INTRAVENOUS

## 2015-02-14 MED ORDER — DICYCLOMINE HCL 10 MG/ML IM SOLN
20.0000 mg | Freq: Once | INTRAMUSCULAR | Status: AC
  Administered 2015-02-14: 20 mg via INTRAMUSCULAR
  Filled 2015-02-14: qty 2

## 2015-02-14 MED ORDER — TRIAMCINOLONE ACETONIDE 0.025 % EX CREA: 1.0000 "application " | TOPICAL_CREAM | Freq: Every day | CUTANEOUS | Status: DC | PRN

## 2015-02-14 MED ORDER — SERTRALINE HCL 100 MG PO TABS
100.0000 mg | ORAL_TABLET | Freq: Every day | ORAL | Status: DC
  Administered 2015-02-14 – 2015-02-22 (×9): 100 mg via ORAL
  Filled 2015-02-14 (×9): qty 1

## 2015-02-14 MED ORDER — MORPHINE SULFATE (PF) 2 MG/ML IV SOLN
2.0000 mg | INTRAVENOUS | Status: DC | PRN
  Administered 2015-02-14 – 2015-02-22 (×29): 2 mg via INTRAVENOUS
  Filled 2015-02-14 (×31): qty 1

## 2015-02-14 MED ORDER — PREDNISONE 5 MG PO TABS
5.0000 mg | ORAL_TABLET | Freq: Every day | ORAL | Status: DC
  Administered 2015-02-15 – 2015-02-22 (×8): 5 mg via ORAL
  Filled 2015-02-14 (×10): qty 1

## 2015-02-14 NOTE — Progress Notes (Signed)
Order placed for RT to stick for lab- RT attempted times 2 to retrieve blood for lab- unable to retrieve. RN aware.

## 2015-02-14 NOTE — ED Notes (Signed)
Though requested for labs. RN and Phlebotomy unsuccessful.

## 2015-02-14 NOTE — Progress Notes (Signed)
[place] Progress Note Patient Name: Connie Ruiz DOB: September 03, 1960 MRN: [id number]   Date of Service  02/14/2015  HPI/Events of Note  Post central line CXR shows questionable new parenchymal findings. May need assessed w/ CT chest.  eICU Interventions  Contacted on call hospitalist to make them aware.     Intervention Category Minor Interventions: Communication with other healthcare providers and/or family  Tera Partridge 02/14/2015, 11:05 PM

## 2015-02-14 NOTE — ED Notes (Signed)
Admitting at bedside 

## 2015-02-14 NOTE — H&P (Signed)
[facility] History and Physical  Connie Ruiz WNU:[id number] DOB: Jul 19, 1960 DOA: 02/13/2015  Referring physician: ED physician PCP: Renato Shin, MD  Specialists:   Chief Complaint: Nausea, vomiting, diarrhea, abdominal pain  HPI: Connie Ruiz is a 54 y.o. 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  Patient is a very poor historian due to expressive aphasia secondary to previous stroke. It seems that she has chronic abdominal pain which has been worsening in the past 24 hours. She has nausea, vomiting and diarrhea. She vomited twice without blood in the vomitus today. She had more than 10 times of bowel movements with watery stool. Her abdominal pain is located in the lower abdomen, 10 out of 10 in severity, constant, nonradiating. Patient does not have fever or chills. She has increased urinary frequency and dysuria, but no burning. Patient has mild cough, but no chest pain or shortness of breath. She has right-sided weakness and aphasia due to previous stroke, which have not changed.  In ED, patient was found to have WBC 15.7, temperature normal, tachycardia, electrolytes okay,  Urinalysis with trace amount of leukocytes. CT abdomen/pelvis showed small esophageal hiatal hernia with possible reflux or dysmotility shown in the distal esophagus, atrophic native kidneys with normal appearing transplant kidney. Bowel is decompressed, limiting evaluation but there is suggestion of inflammatory change in the colon and jejunum possibly indicating enteritis and colitis. Gas and wall thickening in the bladder suggesting cystitis versus previous instrumentation.   Where does patient  live?   At home   Can patient participate in ADLs? Little  Review of Systems:   General: no fevers, chills, no changes in body weight, has poor appetite, has fatigue HEENT: no blurry vision, hearing changes or sore throat Pulm: no dyspnea, coughing, wheezing CV: no chest pain, palpitations Abd: has nausea, vomiting, abdominal pain, diarrhea, no constipation GU: has dysuria, no burning on urination, has increased urinary frequency, no hematuria  Ext: no leg edema Neuro: has R side weakness, no vision change or hearing loss Skin: no rash MSK: No muscle spasm, no deformity, no limitation of range of movement in spin Heme: No easy bruising.  Travel history: No recent long distant travel.  Allergy:  Allergies  Allergen Reactions  . Oxycodone-Acetaminophen Shortness Of Breath and Nausea Only  . Propoxyphene N-Acetaminophen Shortness Of Breath and Nausea Only  . Sulfonamide Derivatives Shortness Of Breath and Nausea Only  . Codeine Nausea Only  . Latex Rash  . Metoprolol Rash    Past Medical History  Diagnosis Date  . THYROID NODULE, LEFT 04/10/2009  . HYPERLIPIDEMIA 08/21/2007  . GOUT 08/21/2007  . HYPERTENSION 08/21/2007    Dr. Andree Elk, [place] 08/21/2007  . CEREBROVASCULAR ACCIDENT, ACUTE 04/15/2010  . GERD 08/21/2007  . RENAL INSUFFICIENCY 08/21/2007  . LUPUS 08/21/2007  . OSTEOPOROSIS 08/21/2007    Rheumatol at [facility]  . DVT, HX OF 08/21/2007  . CLOSTRIDIUM DIFFICILE COLITIS, HX OF 08/21/2007  . KIDNEY TRANSPLANTATION, HX OF 08/22/2007    s/p renal transplant-Dr. Andree Elk, [facility]  . Pulmonary embolism 07/16/2010  . Renal failure   . Current use of long term anticoagulation     Dr. Andree Elk, [facility]  . Depression     Dr. Andree Elk, [facility]  .  History of stroke with residual effects   . Right sided weakness   . CVA 04/17/2010  . Tachycardia   . DIABETES MELLITUS, TYPE II 08/21/2007  . Candida esophagitis 11/12/2014  . Steroid-induced hyperglycemia 11/09/2014     Past Surgical History  Procedure Laterality Date  . Cholecystectomy    . Tubal ligation    . Kidney transplant Right 2009  . Renal biopsy, open  1981  . Enteroscopy N/A 11/11/2014    Procedure: ENTEROSCOPY;  Surgeon: Ladene Artist, MD;  Location: [facility];  Service: Endoscopy;  Laterality: N/A;    Social History:  reports that she has never smoked. She has never used smokeless tobacco. She reports that she does not drink alcohol or use illicit drugs.  Family History:  Family History  Problem Relation Age of Onset  . Heart attack Mother   . Heart disease Father   . Asthma Sister   . Asthma Sister   . Asthma Daughter   . Cancer Maternal Grandfather     prostate  . Cancer Paternal Grandfather     colon     Prior to Admission medications   Medication Sig Start Date End Date Taking? Authorizing Provider  alendronate (FOSAMAX) 70 MG tablet Take 70 mg by mouth once a week. Take with a full glass of water on an empty stomach.   Yes Historical Provider, MD  calcitRIOL (ROCALTROL) 0.25 MCG capsule TAKE ONE CAPSULE BY MOUTH DAILY 02/11/15  Yes Renato Shin, MD  diltiazem (TIAZAC) 240 MG 24 hr capsule Take 1 capsule (240 mg total) by mouth daily. 10/04/14  Yes Renato Shin, MD  esomeprazole (NEXIUM) 20 MG capsule TAKE ONE CAPSULE BY MOUTH EVERY MORNING BEFORE BREAKFAST 02/13/14  Yes Renato Shin, MD  losartan (COZAAR) 100 MG tablet Take 1 tablet (100 mg total) by mouth daily. 02/06/15  Yes Renato Shin, MD  mycophenolate (CELLCEPT) 250 MG capsule Take 250 mg by mouth 2 (two) times daily.   Yes Historical Provider, MD  oxybutynin (DITROPAN) 5 MG tablet Take 5 mg by mouth 3 (three) times daily.   Yes Historical Provider, MD  predniSONE (DELTASONE) 5 MG tablet Take 5 mg by mouth daily with breakfast.   Yes Historical Provider, MD  sertraline (ZOLOFT) 100 MG tablet Take 1 tablet (100 mg total) by mouth daily. 10/04/14  Yes Renato Shin, MD  tacrolimus (PROGRAF) 1 MG capsule Take 1 mg by mouth  2 (two) times daily.  04/16/14  Yes Historical Provider, MD  warfarin (COUMADIN) 5 MG tablet Take 1 to 2 tablets by mouth daily as directed by coumadin clinic Patient taking differently: Take 5 mg by mouth.  01/27/15  Yes Lorretta Harp, MD  ammonium lactate (AMLACTIN) 12 % cream Apply topically as needed for dry skin.    Historical Provider, MD  b complex-vitamin c-folic acid (NEPHRO-VITE) 0.8 MG TABS tablet Take 1 tablet by mouth at bedtime.    Historical Provider, MD  Blood Glucose Monitoring Suppl (ONETOUCH VERIO IQ SYSTEM) W/DEVICE KIT Use to check blood sugar 1 time per day 02/12/15   Renato Shin, MD  Cholecalciferol (VITAMIN D-3 PO) Take by mouth.    Historical Provider, MD  enoxaparin (LOVENOX) 80 MG/0.8ML injection Inject 0.8 mLs (80 mg total) into the skin every 12 (twelve) hours. Patient not taking: Reported on 02/13/2015 01/27/15   Lorretta Harp, MD  folic acid (FOLVITE) 1 MG tablet Take 1 mg by mouth daily.    Historical Provider, MD  glucose blood (ONE  TOUCH ULTRA TEST) test strip Use to check blood sugar 1 time per day 02/12/15   Renato Shin, MD  nystatin (MYCOSTATIN) powder Apply 1 g topically 4 (four) times daily as needed. For yeast under breast    Historical Provider, MD  ondansetron (ZOFRAN ODT) 4 MG disintegrating tablet 42m ODT q4 hours prn nausea/vomiting Patient taking differently: Take 4 mg by mouth every 8 (eight) hours as needed for nausea.  06/28/14   SSherwood Gambler MD  OVan [facility]est Medical CenterDELICA LANCETS 317OMISC Use to check blood sugar 1 time per day 02/12/15   SRenato Shin MD  potassium chloride (K-DUR) 10 MEQ tablet Take 1 tablet (10 mEq total) by mouth daily. Patient not taking: Reported on 02/13/2015 04/07/14   SRenato Shin MD  predniSONE (DELTASONE) 20 MG tablet Take 1 tablet (20 mg total) by mouth daily with breakfast. Take 2 tablets by mouth for the next 3 days then take 1 tablet by mouth for the next 3 days then take your regular home regimen dose of prednisone prior to this  admission Patient not taking: Reported on 02/13/2015 11/14/14   OVelvet Bathe MD  triamcinolone (KENALOG) 0.025 % cream Apply 1 application topically daily as needed (dry skin).     Historical Provider, MD    Physical Exam: Filed Vitals:   02/14/15 0234 02/14/15 0430 02/14/15 0500 02/14/15 0522  BP: 153/86 168/89 170/94 170/94  Pulse: 104   105  Temp:      TempSrc:      Resp: _0 SpO2: 100%   96%   General: Not in acute distress HEENT:       Eyes: PERRL, EOMI, no scleral icterus.       ENT: No discharge from the ears and nose, no pharynx injection, no tonsillar enlargement.        Neck: No JVD, no bruit, no mass felt. Heme: No neck lymph node enlargement. Cardiac: S1/S2, RRR, No murmurs, No gallops or rubs. Pulm: No rales, wheezing, rhonchi or rubs. Abd: Soft, nondistended, tenderness over lower abdomen, no rebound pain, no organomegaly, BS present. Ext: No pitting leg edema bilaterally. 2+DP/PT pulse bilaterally. Musculoskeletal: No joint deformities, No joint redness or warmth, no limitation of ROM in spin. Skin: No rashes.  Neuro: Alert, oriented X3, cranial nerves II-XII grossly intact, Right-sided weakness..Marland KitchenPsych: Patient is not psychotic, no suicidal or hemocidal ideation.  Labs on Admission:  Basic Metabolic Panel:  Recent Labs Lab 02/13/15 2154  NA 140  K 3.4*  CL 109  CO2 21*  GLUCOSE 152*  BUN 13  CREATININE 0.67  CALCIUM 9.4   Liver Function Tests:  Recent Labs Lab 02/13/15 2154  AST 28  ALT 19  ALKPHOS 67  BILITOT 0.6  PROT 7.3  ALBUMIN 3.8    Recent Labs Lab 02/13/15 2154  LIPASE 21*   No results for input(s): AMMONIA in the last 168 hours. CBC:  Recent Labs Lab 02/13/15 2154  WBC 15.7*  NEUTROABS 9.6*  HGB 12.7  HCT 38.4  MCV 91.2  PLT 290   Cardiac Enzymes: No results for input(s): CKTOTAL, CKMB, CKMBINDEX, TROPONINI in the last 168 hours.  BNP (last 3 results) No results for input(s): BNP in the last 8760  hours.  ProBNP (last 3 results) No results for input(s): PROBNP in the last 8760 hours.  CBG: No results for input(s): GLUCAP in the last 168 hours.  Radiological Exams on Admission: Ct Abdomen Pelvis W Contrast  02/14/2015   CLINICAL DATA:  Generalized weakness and abdominal pain. Nausea, vomiting, and diarrhea over the last 2 days.  EXAM: CT ABDOMEN AND PELVIS WITH CONTRAST  TECHNIQUE: Multidetector CT imaging of the abdomen and pelvis was performed using the standard protocol following bolus administration of intravenous contrast.  CONTRAST:  99m OMNIPAQUE IOHEXOL 300 MG/ML SOLN, 1066mOMNIPAQUE IOHEXOL 300 MG/ML SOLN  COMPARISON:  11/07/2014  FINDINGS: The lung bases are clear. Residual contrast material in the lower esophagus may indicate reflux or dysmotility. Small esophageal hiatal hernia. Coronary artery calcifications.  Surgical absence of the gallbladder. Mild bile duct dilatation is likely normal for postcholecystectomy physiology. The liver, spleen, pancreas, adrenal glands, abdominal aorta, inferior vena cava, and retroperitoneal lymph nodes are unremarkable. Diffuse parenchymal atrophy involving both kidneys. No hydronephrosis. Stomach appears normal. Small bowel and colon are mostly decompressed. Suggestion of possible jejunal and colonic wall thickening although evaluation is difficult due to under distention. Mild colitis/enteritis not excluded. No free air or free fluid in the abdomen.  Pelvis: Pelvic transplant kidney demonstrates normal nephrogram. No hydronephrosis. Appendix is normal. Rectosigmoid colon is decompressed. Fluid in the rectum consistent with history of diarrhea and also possibly indicating colitis. Uterus and ovaries are not enlarged. The bladder is decompressed. There is gas in the bladder with possible bladder wall thickening. This may indicate cystitis or previous catheterization. Nodular infiltration in the subcutaneous fat over the lower abdominal wall may  represent injection sites. No destructive bone lesions.  IMPRESSION: Small esophageal hiatal hernia with possible reflux or dysmotility shown in the distal esophagus. Atrophic native kidneys with normal appearing transplant kidney. Bowel is decompressed, limiting evaluation but there is suggestion of inflammatory change in the colon and jejunum possibly indicating enteritis and colitis. Gas and wall thickening in the bladder suggesting cystitis versus previous instrumentation.   Electronically Signed   By: WiLucienne Capers.D.   On: 02/14/2015 00:44    EKG: Not done in ED, will get one.   Assessment/Plan Principal Problem:   Colitis Active Problems:   Gout   Essential hypertension   Chronic diastolic congestive heart failure   GERD   LUPUS   DVT, HX OF   KIDNEY TRANSPLANTATION, HX OF   Pulmonary embolism   Anticoagulated   Expressive aphasia   Depression   Right sided weakness   TIA (transient ischemic attack)   Steroid-induced hyperglycemia   Sepsis   Nausea vomiting and diarrhea   Abdominal pain   UTI (lower urinary tract infection)  Abdominal pain, nausea, vomiting, diarrhea: CT scan was suggestive of enteritis and colitis. The potential differential diagnoses is vasculitic secondary to lupus.  -will admit to tele bed  -start Flagyl and Cipro -check ESR, CRP and ANA. If significantly elevated, may consider steroid for possible vasculitis. -C diff pcr and stool culture -NPO -when necessary Zofran for nausea, morphine for pain  UTI: urinalysis showed trace amount of leukocytes. Patient has symptoms of dysuria and increased urinary frequency. -On Cipro and Flagyl -f/u Ux  Sepsis: patient meets criteria for sepsis with tachycardia and leukocytosis on admission. Likely due to colitis and UTI. She is hemodynamically stable. -will get Procalcitonin and trend lactic acid levels per sepsis protocol. -IVF: 2L of NS bolus in ED, followed by 75 cc/h (patient has congestive heart  failure, limiting aggressive IV fluid treatment)  Essential hypertension: -losartan, diltiazm  Chronic diastolic congestive heart failure: 2-D echo 06/15/13 showed EF of 60-65% with grade 1 diastolic dysfunction. Patient is not on diuretics at home. CHF is compensated. She does  not have any leg edema. -Check BNP  GERD: -switch PPI to IV pepcid until c diff pcr negative  LUPUS:  -f/u ESR, crp and ANA  Hx of DVT and PE: on Coumadin at home. INR was not done in ED -continue Coumadin per pharmacy -Stat INR. If is subtherapeutic, will need to start heparin bridging  KIDNEY TRANSPLANTATION, HX OF: kidney function stable. Creatinine 0.67, BUN 13. -Continue homeCellCept, prednisone and tacrolimus -check tacrolimus level -Solu Cortef stress dose, 50 mg 1  -check cortisol level  Depression and anxiety: Stable, no suicidal or homicidal ideations. -Continue home medications: Zoloft  DVT ppx: on Coumadin  Code Status: Full code Family Communication: None at bed side.      Disposition Plan: Admit to inpatient   Date of Service 02/14/2015    Ivor Costa [facility] Pager [phone]  If 7PM-7AM, please contact night-coverage [email or web address] Password [email or web address] 02/14/2015, 5:59 AM

## 2015-02-14 NOTE — ED Notes (Signed)
When pt vomit, she also has loose yellowish stool.

## 2015-02-14 NOTE — ED Notes (Signed)
Patient transported to CT 

## 2015-02-14 NOTE — Progress Notes (Signed)
ANTIBIOTIC CONSULT NOTE  Pharmacy Consult for Unasyn Indication: intra-abdominal infection  Allergies  Allergen Reactions  . Oxycodone-Acetaminophen Shortness Of Breath and Nausea Only  . Propoxyphene N-Acetaminophen Shortness Of Breath and Nausea Only  . Sulfonamide Derivatives Shortness Of Breath and Nausea Only  . Codeine Nausea Only  . Latex Rash  . Metoprolol Rash    Patient Measurements:    Vital Signs: Temp: 98.3 F (36.8 C) (09/09 0114) Temp Source: Oral (09/09 0114) BP: 170/94 mmHg (09/09 0522) Pulse Rate: 105 (09/09 0522) Intake/Output from previous day:   Intake/Output from this shift:    Labs:  Recent Labs  02/13/15 2154  WBC 15.7*  HGB 12.7  PLT 290  CREATININE 0.67   CrCl cannot be calculated (Unknown ideal weight.). No results for input(s): VANCOTROUGH, VANCOPEAK, VANCORANDOM, GENTTROUGH, GENTPEAK, GENTRANDOM, TOBRATROUGH, TOBRAPEAK, TOBRARND, AMIKACINPEAK, AMIKACINTROU, AMIKACIN in the last 72 hours.   Microbiology: No results found for this or any previous visit (from the past 720 hour(s)).  Anti-infectives    None      Assessment: 54 yo F who presented to the ED with 2 day history of nausea, vomiting, several watery diarrhea and lower abdominal pain.  Admit with colitis, sepsis.  Cipro/Flagyl were initially ordered however since patient is on Coumadin with elevated INR on admission (=5.89) MD was contacted and Cipro was changed to Unasyn.   She is afebrile with leukocytosis. Renal function is at patient's baseline.  Estimated CrCl>87ml/min (using weight from 12/2014).   9/9 >> Unasyn >>  9/9 >>Flagyl>>  9/9 blood x2: IP  9/8 urine: IP  9/9: stool: IP  9/9: Cdiff: IP  Goal of Therapy:  Eradicate infection.  Plan:  Unasyn 3gm IV q6h F/U renal fxn, patient current weight, cx data Consider d/c Flagyl if Cdiff negative as Unasyn will cover other anaerobes (to minimize drug interaction with Coumadin)  Irven Easterly Lavonia Drafts 02/14/2015,7:29 AM

## 2015-02-14 NOTE — Progress Notes (Signed)
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 In the ED patient found to be septic with tachycardia, WBC 15.7. Mild hypokalemia. She had supratherapeutic INR of 5.89. UA positive for UTI. Remaining blood work pending. CT of the abdomen and pelvis showed inflammatory changes in the colon and jejunum suggestive of enteritis and colitis. Also swallowed gas wall thickening in the bladder suggestive of cystitis. Patient admitted to telemetry on empiric antibiotics.  Assessment/Plan: Sepsis Secondary to enteritis/colitis and UTI. Patient still tachycardic. Follow repeat WBC this morning. Pending lactic acid and calcitonin level. Blood cultures ordered. Follow urine cultures. Start her on empiric Cipro and Flagyl but given supra therapeutic INR, switch Cipro to Unasyn. Ordered another liter normal saline bolus in the ED. Continue normal saline at 1 25 mL per hour. -Check stool for C. difficile and GI pathogen panel. Supportive care with IV fluids and antiemetics along with pain control. -Check ESR and CRP. Serial abdominal exam.  Anti-phospholipid syndrome/history of PE on chronic anticoagulation with warfarin INR supratherapeutic. Hold warfarin and monitor. No signs of bleeding. Dosing per pharmacy.  Essential hypertension Continue Cardizem. Hold losartan for now.  Chronic diastolic CHF 2-D echo from 06/2013 with EF of 60-60 Naprosyn and grade 1 diastolic dysfunction. Per his hypovolemic.  GERD Switch PPI to IV  Pepcid  History of anti-phospholipid syndrome On chronic anticoagulation.  Diabetes mellitus type 2 Monitor on sliding scale insulin.  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 On Coumadin.  History of renal transplant Continue CellCept, prednisone and Prograf. Check Prograf level. Renal function stable. Follows with nephrologist at [facility]  Hypokalemia Replenish potassium and fluids.  Code Status: Full code Family Communication: None at bedside Disposition Plan: Admit to telemetry.   Consultants:  None  Procedures:  CT abdomen and pelvis  Antibiotics:  IV Unasyn and Flagyl  HPI/Subjective: Seen and examined. Complains of lower abdominal pain. Nauseous. No vomiting or diarrhea since this morning.  Objective: Filed Vitals:   02/14/15 1100  BP: 152/91  Pulse: 115  Temp:   Resp: 13   No intake or output data in the 24 hours ending 02/14/15 1213 There were no vitals filed for this visit.  Exam:   General: Elderly female lying in bed appears fatigued  HEENT: No pallor, dry oral mucosa, supple neck  Chest: Clear to auscultation bilaterally  CVS: S1 and S2 tachycardic, no murmurs rub or gallop  GI: Soft, nondistended, laparotomy scar+, bowel sounds present, tender to pressure over infraumbilical area  Musculoskeletal: Warm, no edema  CNS: Alert and oriented, expressive aphasia, right-sided weakness (chronic)  Data Reviewed: Basic Metabolic Panel:  Recent Labs Lab 02/13/15 2154  NA 140  K 3.4*  CL 109  CO2 21*  GLUCOSE 152*  BUN 13  CREATININE 0.67  CALCIUM 9.4   Liver Function Tests:  Recent Labs Lab 02/13/15 2154  AST 28  ALT 19  ALKPHOS 67  BILITOT 0.6  PROT 7.3  ALBUMIN 3.8    Recent Labs Lab 02/13/15 2154  LIPASE 21*   No results for input(s): AMMONIA   in the last 168 hours. CBC:  Recent Labs Lab 02/13/15 2154  WBC 15.7*  NEUTROABS 9.6*  HGB 12.7   HCT 38.4  MCV 91.2  PLT 290   Cardiac Enzymes: No results for input(s): CKTOTAL, CKMB, CKMBINDEX, TROPONINI in the last 168 hours. BNP (last 3 results) No results for input(s): BNP in the last 8760 hours.  ProBNP (last 3 results) No results for input(s): PROBNP in the last 8760 hours.  CBG: No results for input(s): GLUCAP in the last 168 hours.  No results found for this or any previous visit (from the past 240 hour(s)).   Studies: Ct Abdomen Pelvis W Contrast  02/14/2015   CLINICAL DATA:  Generalized weakness and abdominal pain. Nausea, vomiting, and diarrhea over the last 2 days.  EXAM: CT ABDOMEN AND PELVIS WITH CONTRAST  TECHNIQUE: Multidetector CT imaging of the abdomen and pelvis was performed using the standard protocol following bolus administration of intravenous contrast.  CONTRAST:  25mL OMNIPAQUE IOHEXOL 300 MG/ML SOLN, 100mL OMNIPAQUE IOHEXOL 300 MG/ML SOLN  COMPARISON:  11/07/2014  FINDINGS: The lung bases are clear. Residual contrast material in the lower esophagus may indicate reflux or dysmotility. Small esophageal hiatal hernia. Coronary artery calcifications.  Surgical absence of the gallbladder. Mild bile duct dilatation is likely normal for postcholecystectomy physiology. The liver, spleen, pancreas, adrenal glands, abdominal aorta, inferior vena cava, and retroperitoneal lymph nodes are unremarkable. Diffuse parenchymal atrophy involving both kidneys. No hydronephrosis. Stomach appears normal. Small bowel and colon are mostly decompressed. Suggestion of possible jejunal and colonic wall thickening although evaluation is difficult due to under distention. Mild colitis/enteritis not excluded. No free air or free fluid in the abdomen.  Pelvis: Pelvic transplant kidney demonstrates normal nephrogram. No hydronephrosis. Appendix is normal. Rectosigmoid colon is decompressed. Fluid in the rectum consistent with history of diarrhea and also possibly indicating colitis. Uterus and  ovaries are not enlarged. The bladder is decompressed. There is gas in the bladder with possible bladder wall thickening. This may indicate cystitis or previous catheterization. Nodular infiltration in the subcutaneous fat over the lower abdominal wall may represent injection sites. No destructive bone lesions.  IMPRESSION: Small esophageal hiatal hernia with possible reflux or dysmotility shown in the distal esophagus. Atrophic native kidneys with normal appearing transplant kidney. Bowel is decompressed, limiting evaluation but there is suggestion of inflammatory change in the colon and jejunum possibly indicating enteritis and colitis. Gas and wall thickening in the bladder suggesting cystitis versus previous instrumentation.   Electronically Signed   By: William  Stevens M.D.   On: 02/14/2015 00:44    Scheduled Meds: . alendronate  70 mg Oral Weekly  . calcitRIOL  0.25 mcg Oral Daily  . diltiazem  240 mg Oral Daily  . folic acid  1 mg Oral Daily  . hydrocortisone sod succinate (SOLU-CORTEF) inj  50 mg Intravenous Once  . losartan  100 mg Oral Daily  . multivitamin  1 tablet Oral QHS  . mycophenolate  250 mg Oral BID  . oxybutynin  5 mg Oral TID  . [START ON 02/15/2015] predniSONE  5 mg Oral Q breakfast  . sertraline  100 mg Oral Daily  . sodium chloride  3 mL Intravenous Q12H  . tacrolimus  1 mg Oral BID  . Vitamin D-3  1 capsule Oral Daily  . [START ON 02/15/2015] Warfarin - Pharmacist Dosing Inpatient   Does not apply q1800   Continuous Infusions: . sodium chloride    . famotidine (PEPCID) IV    .   metronidazole    . sodium chloride    . sodium chloride        Time spent: 25 minutes    Cloris Flippo  [facility] Pager [phone] If 7PM-7AM, please contact night-coverage at [email or web address], password [email or web address] 02/14/2015, 12:13 PM  LOS: 0 days

## 2015-02-14 NOTE — Procedures (Signed)
Central Venous Catheter Insertion Procedure Note SHAKEYIA KATKO [id number] [birth year]  Procedure: Insertion of Central Venous Catheter Indications: Drug and/or fluid administration and Frequent blood sampling  Procedure Details Consent: Risks of procedure as well as the alternatives and risks of each were explained to the (patient/caregiver).  Consent for procedure obtained.   Time Out: Verified patient identification, verified procedure, site/side was marked, verified correct patient position, special equipment/implants available, medications/allergies/relevent history reviewed, required imaging and test results available.  Performed  Maximum sterile technique was used including antiseptics, cap, gloves, gown, hand hygiene, mask and sheet. Skin prep: Chlorhexidine; local anesthetic administered A antimicrobial bonded/coated triple lumen catheter was placed in the left internal jugular vein using the Seldinger technique.  Sutured at 17 cm.    Evaluation Blood flow good Complications: No apparent complications Patient did tolerate procedure well. Chest X-ray ordered to verify placement.  CXR: pending.    Procedure performed under direct supervision of Dr. Chase Caller  and with ultrasound guidance for real time vessel cannulation.     Noe Gens, NP-C [facility] Pgr: [phone] or if no answer [phone] 02/14/2015, 5:10 PM

## 2015-02-14 NOTE — Progress Notes (Signed)
ANTICOAGULATION CONSULT NOTE - Initial Consult  Pharmacy Consult for Coumadin Indication: hx PE, +antiphospholipid syndrome  Allergies  Allergen Reactions  . Oxycodone-Acetaminophen Shortness Of Breath and Nausea Only  . Propoxyphene N-Acetaminophen Shortness Of Breath and Nausea Only  . Sulfonamide Derivatives Shortness Of Breath and Nausea Only  . Codeine Nausea Only  . Latex Rash  . Metoprolol Rash    Patient Measurements:    Vital Signs: Temp: 98.3 F (36.8 C) (09/09 0114) Temp Source: Oral (09/09 0114) BP: 170/94 mmHg (09/09 0522) Pulse Rate: 105 (09/09 0522)  Labs:  Recent Labs  02/13/15 2154 02/14/15 0550  HGB 12.7  --   HCT 38.4  --   PLT 290  --   LABPROT  --  50.8*  INR  --  5.89*  CREATININE 0.67  --     CrCl cannot be calculated (Unknown ideal weight.).   Medical History: Past Medical History  Diagnosis Date  . THYROID NODULE, LEFT 04/10/2009  . HYPERLIPIDEMIA 08/21/2007  . GOUT 08/21/2007  . HYPERTENSION 08/21/2007    Dr. Andree Elk, [place] 08/21/2007  . CEREBROVASCULAR ACCIDENT, ACUTE 04/15/2010  . GERD 08/21/2007  . RENAL INSUFFICIENCY 08/21/2007  . LUPUS 08/21/2007  . OSTEOPOROSIS 08/21/2007    Rheumatol at [facility]  . DVT, HX OF 08/21/2007  . CLOSTRIDIUM DIFFICILE COLITIS, HX OF 08/21/2007  . KIDNEY TRANSPLANTATION, HX OF 08/22/2007    s/p renal transplant-Dr. Andree Elk, [facility]  . Pulmonary embolism 07/16/2010  . Renal failure   . Current use of long term anticoagulation     Dr. Andree Elk, [facility]  . Depression     Dr. Andree Elk, [facility]  . History of stroke with residual effects   . Right sided weakness   . CVA 04/17/2010  . Tachycardia   . DIABETES MELLITUS, TYPE II 08/21/2007  . Candida esophagitis 11/12/2014  . Steroid-induced hyperglycemia 11/09/2014    Medications:  PTA Coumadin dose: 5mg  po daily  Assessment: 54 yo F on admitted with sepsis, colitis.  She is on chronic Coumadin for hx PE, +antiphospholipid syndrome.    She has been on Coumadin 5mg  po daily ~ 2 weeks per outpatient [facility] flowsheet.  INR was 5.3 on 9/2.  She was instructed to skip doses x 2 days then resume 5mg  daily at that time.   INR remains elevated on admission INR = 5.89).  Last dose 9/7. CBC ok, no bleeding noted.  02/14/2015:  INR elevated No bleeding noted Major Drug Interactions: Cipro/Flagyl for colitis Renal function at patient's baseline Diet: NPO  Goal of Therapy:  INR 2-3   Plan:  Hold Coumadin Daily INR May consider switch Cipro/Flagyl to Unasyn d/t interaction with warfarin   Biagio Borg 02/14/2015,7:06 AM

## 2015-02-14 NOTE — ED Notes (Signed)
Pt offered PO fluids and shortly thereafter had an episode of diarrhea.

## 2015-02-14 NOTE — Telephone Encounter (Signed)
See note below to be advised. 

## 2015-02-14 NOTE — Telephone Encounter (Signed)
Team health note dated 02/13/15 7:59 PM Chief Complaint ABDOMINAL PAIN - Severe and only in abdomen Initial Comment Caller states mother having severe abd pain and diarrhea PreDisposition Go to ED Nurse Assessment Nurse: Amalia Hailey, RN, Melissa Date/Time (Eastern Time): 02/13/2015 7:59:33 PM Confirm and document reason for call. If symptomatic, describe symptoms. ---Caller states mother having severe abd pain and diarrhea Has the patient traveled out of the country within the last 30 days? ---Not Applicable Does the patient require triage? ---Yes Related visit to physician within the last 2 weeks? ---No Does the PT have any chronic conditions? (i.e. diabetes, asthma, etc.) ---Yes List chronic conditions. ---diabetes, hypertension, lupus, strokes x8 and dementia, hospitalized 2-3 weeks ago for "same thing" Did the patient indicate they were pregnant? ---No Guidelines Guideline Title Affirmed Question Affirmed Notes Nurse Date/Time (Eastern Time) Abdominal Pain - Female [1] SEVERE pain (e.g., excruciating) AND [2] present > 1 hour Evans, RN, Melissa 02/13/2015 8:02:49 PM Disp. Time Eilene Ghazi Time) Disposition Final User 02/13/2015 7:54:52 PM Send to Urgent Queue Fransico Michael 02/13/2015 8:06:00 PM Go to ED Now Yes Amalia Hailey, RN, Lenna Sciara

## 2015-02-14 NOTE — ED Notes (Signed)
Rolene Course [phone]) is the patient's sister and caregiver. She would like to be contacted with updates. The pt would like for her sister to be contacted with updates as well

## 2015-02-15 ENCOUNTER — Inpatient Hospital Stay (HOSPITAL_COMMUNITY): Payer: Medicare Other

## 2015-02-15 DIAGNOSIS — R911 Solitary pulmonary nodule: Secondary | ICD-10-CM

## 2015-02-15 DIAGNOSIS — I472 Ventricular tachycardia: Secondary | ICD-10-CM

## 2015-02-15 DIAGNOSIS — R791 Abnormal coagulation profile: Secondary | ICD-10-CM

## 2015-02-15 LAB — GLUCOSE, CAPILLARY
Glucose-Capillary: 146 mg/dL — ABNORMAL HIGH (ref 65–99)
Glucose-Capillary: 83 mg/dL (ref 65–99)
Glucose-Capillary: 88 mg/dL (ref 65–99)
Glucose-Capillary: 93 mg/dL (ref 65–99)

## 2015-02-15 LAB — FANA STAINING PATTERNS: Homogeneous Pattern: 1:80 {titer}

## 2015-02-15 LAB — CBC
HCT: 37.6 % (ref 36.0–46.0)
Hemoglobin: 12.1 g/dL (ref 12.0–15.0)
MCH: 29.4 pg (ref 26.0–34.0)
MCHC: 32.2 g/dL (ref 30.0–36.0)
MCV: 91.5 fL (ref 78.0–100.0)
Platelets: 210 10*3/uL (ref 150–400)
RBC: 4.11 MIL/uL (ref 3.87–5.11)
RDW: 14 % (ref 11.5–15.5)
WBC: 12.9 10*3/uL — ABNORMAL HIGH (ref 4.0–10.5)

## 2015-02-15 LAB — BASIC METABOLIC PANEL
Anion gap: 8 (ref 5–15)
BUN: 11 mg/dL (ref 6–20)
CO2: 20 mmol/L — ABNORMAL LOW (ref 22–32)
Calcium: 8.5 mg/dL — ABNORMAL LOW (ref 8.9–10.3)
Chloride: 113 mmol/L — ABNORMAL HIGH (ref 101–111)
Creatinine, Ser: 0.65 mg/dL (ref 0.44–1.00)
GFR calc Af Amer: >60 mL/min (ref >60)
GFR calc non Af Amer: >60 mL/min (ref >60)
Glucose, Bld: 84 mg/dL (ref 65–99)
Potassium: 3.2 mmol/L — ABNORMAL LOW (ref 3.5–5.1)
Sodium: 141 mmol/L (ref 135–145)

## 2015-02-15 LAB — PROTIME-INR
INR: 5.11 (ref 0.00–1.49)
Prothrombin Time: 45.7 seconds — ABNORMAL HIGH (ref 11.6–15.2)

## 2015-02-15 LAB — ANTINUCLEAR ANTIBODIES, IFA: ANA Ab, IFA: POSITIVE — AB

## 2015-02-15 MED ORDER — MAGNESIUM SULFATE 2 GM/50ML IV SOLN
2.0000 g | Freq: Once | INTRAVENOUS | Status: AC
  Administered 2015-02-15: 2 g via INTRAVENOUS
  Filled 2015-02-15: qty 50

## 2015-02-15 MED ORDER — POTASSIUM CHLORIDE CRYS ER 20 MEQ PO TBCR
40.0000 meq | EXTENDED_RELEASE_TABLET | Freq: Once | ORAL | Status: AC
  Administered 2015-02-15: 40 meq via ORAL
  Filled 2015-02-15: qty 2

## 2015-02-15 MED ORDER — VITAMIN K1 10 MG/ML IJ SOLN
10.0000 mg | Freq: Once | INTRAMUSCULAR | Status: AC
  Administered 2015-02-15: 10 mg via SUBCUTANEOUS
  Filled 2015-02-15: qty 1

## 2015-02-15 MED ORDER — IOHEXOL 300 MG/ML  SOLN
75.0000 mL | Freq: Once | INTRAMUSCULAR | Status: AC | PRN
  Administered 2015-02-15: 75 mL via INTRAVENOUS

## 2015-02-15 NOTE — Progress Notes (Signed)
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 In the ED patient found to be septic with tachycardia, WBC 15.7. Mild hypokalemia. She had supratherapeutic INR of 5.89. UA positive for UTI. Remaining blood work pending. CT of the abdomen and pelvis showed inflammatory changes in the colon and jejunum suggestive of enteritis and colitis. Also showed gas wall thickening in the bladder suggestive of cystitis. Patient admitted to telemetry on empiric antibiotics.  Assessment/Plan: Sepsis Secondary to enteritis/colitis and UTI. Patient still tachycardic. Afebrile with mild leukocytosis this morning. Lactic acid normal. Follow blood cultures and urine cultures. Started on empiric Unasyn and Flagyl. C. difficile antigen positive toxin negative. Will treat for at least one week course with Flagyl. Follow GI pathogen panel. -Still has diarrhea but improving. Continue IV hydration with antiemetics and pain medications for supportive care.   Anti-phospholipid syndrome/history of PE on chronic anticoagulation with warfarin INR supratherapeutic. Holding warfarin and given subcutaneous vitamin K. Monitor closely. No signs of bleeding.   Irregular right lobe nodular density Seen on chest x-ray with a 4 cm left upper lobe and 5 mm inferior right lower lobe. Will obtain CT of the chest with contrast in a.m.  Hypokalemia/hypomagnesemia NSVT on 9/9  replenished. Monitor on telemetry.  Essential hypertension Continue Cardizem. Hold  losartan for now.  Chronic diastolic CHF 2-D echo from 06/2013 with EF of 60-65% and grade 1 diastolic dysfunction. Currently hypovolemic.   GERD Switched PPI to IV Pepcid.  History of anti-phospholipid syndrome On chronic anticoagulation. INR supratherapeutic  Diabetes mellitus type 2 Monitor on sliding scale insulin.  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 On Coumadin.  History of renal transplant Continue CellCept, prednisone and Prograf. Check Prograf level. Renal function stable. Follows with nephrologist at [facility]     Poor IV access Appreciate PCCM consulted for central line placement. Has left IJ.  Diet: Full liquid  DVT prophylaxis: Supratherapeutic INR.   Code Status: Full code Family Communication: None at bedside Disposition Plan: Admit to telemetry.   Consultants:  None  Procedures:  CT abdomen and pelvis  Antibiotics:  IV Unasyn and Flagyl  HPI/Subjective: Seen and examined. Complains of lower abdominal pain. Reports some nausea and 3 episodes of diarrhea since yesterday.  Objective: Filed Vitals:   02/15/15 0700  BP: 164/82  Pulse: 85  Temp: 97.7 F (36.5 C)  Resp: 18    Intake/Output Summary (Last 24 hours) at 02/15/15 1202 Last data filed at 02/15/15 1108  Gross per 24 hour  Intake 2420.84 ml  Output    550 ml  Net 1870.84 ml   Filed Weights   02/14/15 1733  Weight: 77.4 kg (170 lb 10.2 oz)    Exam:   General: Elderly female lying in bed in no acute distress  HEENT: No pallor, moist oral mucosa, supple neck  Chest: Clear to auscultation bilaterally  CVS: S1 and S2 tachycardic, no murmurs rub or gallop  GI: Soft, nondistended, laparotomy scar+, bowel sounds present, tender to pressure over infraumbilical area  Musculoskeletal: Warm, no edema  CNS: Alert  and oriented, expressive aphasia, right-sided weakness (chronic)  Data Reviewed: Basic  Metabolic Panel:  Recent Labs Lab 02/13/15 2154 02/14/15 1955 02/15/15 0520  NA 140 140 141  K 3.4* 3.3* 3.2*  CL 109 113* 113*  CO2 21* 19* 20*  GLUCOSE 152* 99 84  BUN 13 11 11   CREATININE 0.67 0.70 0.65  CALCIUM 9.4 8.2* 8.5*  MG  --  1.0*  --    Liver Function Tests:  Recent Labs Lab 02/13/15 2154  AST 28  ALT 19  ALKPHOS 67  BILITOT 0.6  PROT 7.3  ALBUMIN 3.8    Recent Labs Lab 02/13/15 2154  LIPASE 21*   No results for input(s): AMMONIA in the last 168 hours. CBC:  Recent Labs Lab 02/13/15 2154 02/14/15 1955 02/15/15 0520  WBC 15.7* 10.1 12.9*  NEUTROABS 9.6*  --   --   HGB 12.7 12.2 12.1  HCT 38.4 38.3 37.6  MCV 91.2 92.3 91.5  PLT 290 211 210   Cardiac Enzymes: No results for input(s): CKTOTAL, CKMB, CKMBINDEX, TROPONINI in the last 168 hours. BNP (last 3 results)  Recent Labs  02/14/15 1955  BNP 53.0    ProBNP (last 3 results) No results for input(s): PROBNP in the last 8760 hours.  CBG:  Recent Labs Lab 02/14/15 1713 02/14/15 2012 02/15/15 0010 02/15/15 0354 02/15/15 0825  GLUCAP 86 107* 146* 88 83    Recent Results (from the past 240 hour(s))  Culture, blood (x 2)     Status: None (Preliminary result)   Collection Time: 02/14/15 11:10 AM  Result Value Ref Range Status   Specimen Description BLOOD LEFT FINGER  Final   Special Requests IN PEDIATRIC BOTTLE 3ML  Final   Culture   Final    NO GROWTH < 24 HOURS Performed at [facility]    Report Status PENDING  Incomplete  C difficile quick screen w PCR reflex     Status: Abnormal   Collection Time: 02/14/15 12:28 PM  Result Value Ref Range Status   C Diff antigen POSITIVE (A) NEGATIVE Final   C Diff toxin NEGATIVE NEGATIVE Final   C Diff interpretation   Final    C. difficile present, but toxin not detected. This indicates colonization. In most cases, this does not require treatment. If patient has signs and symptoms consistent with colitis, consider treatment.   Urine culture     Status: None (Preliminary result)   Collection Time: 02/14/15  6:21 PM  Result Value Ref Range Status   Specimen Description URINE, CLEAN CATCH  Final   Special Requests NONE  Final   Culture   Final    NO GROWTH < 12 HOURS Performed at [facility]    Report Status PENDING  Incomplete  Culture, blood (x 2)     Status: None (Preliminary result)   Collection Time: 02/14/15  7:55 PM  Result Value Ref Range Status   Specimen Description BLOOD RIGHT ARM  Final   Special Requests BOTTLES DRAWN AEROBIC AND ANAEROBIC 5CC  Final   Culture   Final    NO GROWTH < 12 HOURS Performed at [facility]    Report Status PENDING  Incomplete     Studies: Ct Abdomen Pelvis W Contrast  02/14/2015   CLINICAL DATA:  Generalized weakness and abdominal pain. Nausea, vomiting, and diarrhea over the last 2 days.  EXAM: CT ABDOMEN AND PELVIS WITH CONTRAST  TECHNIQUE: Multidetector CT imaging of the abdomen and pelvis was  performed using the standard protocol following bolus administration of intravenous contrast.  CONTRAST:  82mL OMNIPAQUE IOHEXOL 300 MG/ML SOLN, 142mL OMNIPAQUE IOHEXOL 300 MG/ML SOLN  COMPARISON:  11/07/2014  FINDINGS: The lung bases are clear. Residual contrast material in the lower esophagus may indicate reflux or dysmotility. Small esophageal hiatal hernia. Coronary artery calcifications.  Surgical absence of the gallbladder. Mild bile duct dilatation is likely normal for postcholecystectomy physiology. The liver, spleen, pancreas, adrenal glands, abdominal aorta, inferior vena cava, and retroperitoneal lymph nodes are unremarkable. Diffuse parenchymal atrophy involving both kidneys. No hydronephrosis. Stomach appears normal. Small bowel and colon are mostly decompressed. Suggestion of possible jejunal and colonic wall thickening although evaluation is difficult due to under distention. Mild colitis/enteritis not excluded. No free air or free fluid in the abdomen.   Pelvis: Pelvic transplant kidney demonstrates normal nephrogram. No hydronephrosis. Appendix is normal. Rectosigmoid colon is decompressed. Fluid in the rectum consistent with history of diarrhea and also possibly indicating colitis. Uterus and ovaries are not enlarged. The bladder is decompressed. There is gas in the bladder with possible bladder wall thickening. This may indicate cystitis or previous catheterization. Nodular infiltration in the subcutaneous fat over the lower abdominal wall may represent injection sites. No destructive bone lesions.  IMPRESSION: Small esophageal hiatal hernia with possible reflux or dysmotility shown in the distal esophagus. Atrophic native kidneys with normal appearing transplant kidney. Bowel is decompressed, limiting evaluation but there is suggestion of inflammatory change in the colon and jejunum possibly indicating enteritis and colitis. Gas and wall thickening in the bladder suggesting cystitis versus previous instrumentation.   Electronically Signed   By: Lucienne Capers M.D.   On: 02/14/2015 00:44   Dg Chest Port 1 View  02/14/2015   CLINICAL DATA:  Evaluate central line placement  EXAM: PORTABLE CHEST - 1 VIEW  COMPARISON:  10/27/2012  FINDINGS: Left internal jugular central line identified with tip just above the cavoatrial junction. No pneumothorax.  Heart size and vascular pattern are normal. Irregular opacity measuring about 4 cm over the left upper lobe new from prior studies. 5 mm nodular opacity lateral inferior right lobe also new. All  IMPRESSION: 1.  Central line as described.  2. Consider CT thorax with contrast if possible, to evaluate new nodular density right lower lobe and new irregular 4cm left upper lobe opacity, which could represent a lung mass.   Electronically Signed   By: Skipper Cliche M.D.   On: 02/14/2015 18:02    Scheduled Meds: . ampicillin-sulbactam (UNASYN) IV  3 g Intravenous Q6H  . calcitRIOL  0.25 mcg Oral Daily  . chlorhexidine   15 mL Mouth/Throat BID  . cholecalciferol  1,000 Units Oral Daily  . diltiazem  240 mg Oral Daily  . famotidine (PEPCID) IV  20 mg Intravenous Q12H  . folic acid  1 mg Oral Daily  . insulin aspart  0-9 Units Subcutaneous 6 times per day  . losartan  100 mg Oral Daily  . metronidazole  500 mg Intravenous Q8H  . multivitamin  1 tablet Oral QHS  . mycophenolate  250 mg Oral BID  . oxybutynin  5 mg Oral TID  . predniSONE  5 mg Oral Q breakfast  . sertraline  100 mg Oral Daily  . sodium chloride  3 mL Intravenous Q12H  . tacrolimus  1 mg Oral BID  . Warfarin - Pharmacist Dosing Inpatient   Does not apply q1800   Continuous Infusions: . sodium chloride 125 mL/hr at 02/15/15  0246      Time spent: 35 minutes    Louellen Molder  [facility] Pager [phone] If 7PM-7AM, please contact night-coverage at [email or web address], password [facility] 02/15/2015, 12:02 PM  LOS: 1 day

## 2015-02-15 NOTE — Progress Notes (Signed)
PT Cancellation Note  Patient Details Name: Connie Ruiz MRN: [id number] DOB: 01-28-1961   Cancelled Treatment:    Reason Eval/Treat Not Completed: Medical issues which prohibited therapy. Pt's INR 5.11.  Will hold off on PT eval until INR is decreased to therapeutic range.  Will check back to complete evaluation.   Lalita Ebel LUBECK 02/15/2015, 1:11 PM

## 2015-02-15 NOTE — Progress Notes (Signed)
ANTICOAGULATION CONSULT NOTE - Initial Consult  Pharmacy Consult for Coumadin Indication: hx PE, +antiphospholipid syndrome  Allergies  Allergen Reactions  . Oxycodone-Acetaminophen Shortness Of Breath and Nausea Only  . Propoxyphene N-Acetaminophen Shortness Of Breath and Nausea Only  . Sulfonamide Derivatives Shortness Of Breath and Nausea Only  . Codeine Nausea Only  . Latex Rash  . Metoprolol Rash  . Morphine And Related Rash    IV site on arm is red, patient reports this is improving.  NO shortness of breath reported.    Patient Measurements: Height: 5\' 4"  (162.6 cm) Weight: 170 lb 10.2 oz (77.4 kg) IBW/kg (Calculated) : 54.7  Vital Signs: Temp: 97.7 F (36.5 C) (09/10 0700) Temp Source: Oral (09/10 0700) BP: 164/82 mmHg (09/10 0700) Pulse Rate: 85 (09/10 0700)  Labs:  Recent Labs  02/13/15 2154 02/14/15 0550 02/14/15 1955 02/14/15 2045 02/15/15 0520  HGB 12.7  --  12.2  --  12.1  HCT 38.4  --  38.3  --  37.6  PLT 290  --  211  --  210  APTT  --   --   --  70*  --   LABPROT  --  50.8*  --   --  45.7*  INR  --  5.89*  --   --  5.11*  CREATININE 0.67  --  0.70  --  0.65    Estimated Creatinine Clearance: 81 mL/min (by C-G formula based on Cr of 0.65).   Medical History: Past Medical History  Diagnosis Date  . THYROID NODULE, LEFT 04/10/2009  . HYPERLIPIDEMIA 08/21/2007  . GOUT 08/21/2007  . HYPERTENSION 08/21/2007    Dr. Andree Elk, [place] 08/21/2007  . CEREBROVASCULAR ACCIDENT, ACUTE 04/15/2010  . GERD 08/21/2007  . RENAL INSUFFICIENCY 08/21/2007  . LUPUS 08/21/2007  . OSTEOPOROSIS 08/21/2007    Rheumatol at [facility]  . DVT, HX OF 08/21/2007  . CLOSTRIDIUM DIFFICILE COLITIS, HX OF 08/21/2007  . KIDNEY TRANSPLANTATION, HX OF 08/22/2007    s/p renal transplant-Dr. Andree Elk, [facility]  . Pulmonary embolism 07/16/2010  . Renal failure   . Current use of long term anticoagulation     Dr. Andree Elk, [facility]  . Depression     Dr. Andree Elk, [facility]   . History of stroke with residual effects   . Right sided weakness   . CVA 04/17/2010  . Tachycardia   . DIABETES MELLITUS, TYPE II 08/21/2007  . Candida esophagitis 11/12/2014  . Steroid-induced hyperglycemia 11/09/2014    Medications:  PTA Coumadin dose: 5mg  po daily  Assessment: 54 yo F on admitted with sepsis, colitis.  She is on chronic Coumadin for hx PE, +antiphospholipid syndrome.  She has been on Coumadin 5mg  po daily ~ 2 weeks per outpatient [facility] flowsheet.  INR was 5.3 on 9/2.  She was instructed to skip doses x 2 days then resume 5mg  daily at that time.   INR remains elevated on admission INR = 5.89).  Last dose 9/7. CBC ok, no bleeding noted.  Today, 02/15/2015: - INR supra-therapeutic at 5.11 but down from yesterday after vit K dose (vit K 5mg  SQ on 9/9, 10mg  SQ on 9/10) - cbc stable - No bleeding documented - Major Drug Interactions: Cipro/Flagyl for colitis - Diet: clear liquid  Goal of Therapy:  INR 2-3   Plan:  - continue to hold Coumadin today - Daily INR  Jalon Squier P 02/15/2015,1:07 PM

## 2015-02-16 ENCOUNTER — Inpatient Hospital Stay (HOSPITAL_COMMUNITY): Payer: Medicare Other

## 2015-02-16 DIAGNOSIS — E876 Hypokalemia: Secondary | ICD-10-CM

## 2015-02-16 LAB — BASIC METABOLIC PANEL
Anion gap: 7 (ref 5–15)
BUN: <5 mg/dL — ABNORMAL LOW (ref 6–20)
CO2: 24 mmol/L (ref 22–32)
Calcium: 8.1 mg/dL — ABNORMAL LOW (ref 8.9–10.3)
Chloride: 107 mmol/L (ref 101–111)
Creatinine, Ser: 0.56 mg/dL (ref 0.44–1.00)
GFR calc Af Amer: >60 mL/min (ref >60)
GFR calc non Af Amer: >60 mL/min (ref >60)
Glucose, Bld: 77 mg/dL (ref 65–99)
Potassium: 2.9 mmol/L — ABNORMAL LOW (ref 3.5–5.1)
Sodium: 138 mmol/L (ref 135–145)

## 2015-02-16 LAB — GLUCOSE, CAPILLARY
Glucose-Capillary: 102 mg/dL — ABNORMAL HIGH (ref 65–99)
Glucose-Capillary: 120 mg/dL — ABNORMAL HIGH (ref 65–99)
Glucose-Capillary: 78 mg/dL (ref 65–99)
Glucose-Capillary: 82 mg/dL (ref 65–99)
Glucose-Capillary: 93 mg/dL (ref 65–99)

## 2015-02-16 LAB — TSH: TSH: 2.153 u[IU]/mL (ref 0.350–4.500)

## 2015-02-16 LAB — URINE CULTURE: Culture: NO GROWTH

## 2015-02-16 LAB — TACROLIMUS LEVEL: Tacrolimus (FK506) - LabCorp: 5.7 ng/mL (ref 2.0–20.0)

## 2015-02-16 LAB — PROTIME-INR
INR: 2.28 — ABNORMAL HIGH (ref 0.00–1.49)
Prothrombin Time: 24.9 seconds — ABNORMAL HIGH (ref 11.6–15.2)

## 2015-02-16 LAB — MAGNESIUM: Magnesium: 1.2 mg/dL — ABNORMAL LOW (ref 1.7–2.4)

## 2015-02-16 LAB — T4, FREE: Free T4: 1.33 ng/dL — ABNORMAL HIGH (ref 0.61–1.12)

## 2015-02-16 MED ORDER — FAMOTIDINE 20 MG PO TABS
20.0000 mg | ORAL_TABLET | Freq: Two times a day (BID) | ORAL | Status: DC
  Administered 2015-02-16 – 2015-02-22 (×12): 20 mg via ORAL
  Filled 2015-02-16 (×13): qty 1

## 2015-02-16 MED ORDER — MAGNESIUM SULFATE 2 GM/50ML IV SOLN
2.0000 g | Freq: Once | INTRAVENOUS | Status: AC
Start: 2015-02-16 — End: 2015-02-16
  Administered 2015-02-16: 2 g via INTRAVENOUS
  Filled 2015-02-16: qty 50

## 2015-02-16 MED ORDER — POTASSIUM CHLORIDE CRYS ER 20 MEQ PO TBCR
40.0000 meq | EXTENDED_RELEASE_TABLET | ORAL | Status: AC
  Administered 2015-02-16 (×2): 40 meq via ORAL
  Filled 2015-02-16 (×2): qty 2

## 2015-02-16 MED ORDER — WARFARIN SODIUM 2.5 MG PO TABS
2.5000 mg | ORAL_TABLET | Freq: Once | ORAL | Status: AC
  Administered 2015-02-16: 2.5 mg via ORAL
  Filled 2015-02-16: qty 1

## 2015-02-16 MED ORDER — MAGNESIUM OXIDE 400 (241.3 MG) MG PO TABS
400.0000 mg | ORAL_TABLET | Freq: Two times a day (BID) | ORAL | Status: AC
  Administered 2015-02-16 (×2): 400 mg via ORAL
  Filled 2015-02-16 (×2): qty 1

## 2015-02-16 NOTE — Progress Notes (Signed)
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 In the ED patient found to be septic with tachycardia, WBC 15.7. Mild hypokalemia. She had supratherapeutic INR of 5.89. UA positive for UTI. Remaining blood work pending. CT of the abdomen and pelvis showed inflammatory changes in the colon and jejunum suggestive of enteritis and colitis. Also showed gas wall thickening in the bladder suggestive of cystitis. Patient admitted to telemetry on empiric antibiotics.  Assessment/Plan: Sepsis Secondary to enteritis/colitis and UTI. -Resolved. Afebrile. Lactic acid normal.  -Preliminary blood cultures negative. Urine cultures: no growth.  - empiric Unasyn and Flagyl. C. difficile antigen positive toxin negative. Will treat for at least 10 day course with Flagyl. Follow GI pathogen panel. -Still reports diarrhea but improving. Continue IV hydration,  antiemetics and pain medications for supportive care.   Anti-phospholipid syndrome/history of PE on chronic anticoagulation with warfarin INR supratherapeutic on admission. No signs of bleeding. Warfarin held and received vitamin K. Therapeutic today. May resume warfarin.   Irregular right lobe nodular density Seen on chest x-ray with a 4 cm left upper lobe and 5 mm inferior right lower lobe. CT of the chest with contrast was negative. Showed a incidental 2.5 cm solid nodule arising from the thyroid isthmus and 1.8 cm irregular density in the left thyroid lobe. Check TSH, free T3 and T4  and a thyroid ultrasound.  Hypokalemia/hypomagnesemia  Patient having PVCs  replenished. Monitor on telemetry. Recheck labs in a.m.  Essential hypertension Continue Cardizem. Hold losartan for now.  Chronic diastolic CHF 2-D echo from 06/2013 with EF of 60-65% and grade 1 diastolic dysfunction. Currently hypovolemic.   GERD Continue Pepcid.  History of anti-phospholipid syndrome On chronic anticoagulation. Resume warfarin today.  Diabetes mellitus type 2 Monitor on sliding scale insulin.  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 On Coumadin. Ordered PT evaluation.  History of renal transplant Continue CellCept, prednisone and Prograf. Check Prograf level. Renal function stable. Follows with nephrologist at [facility]   Poor IV access Appreciate PCCM consulted for central line placement. Has left IJ.  Diet: Full liquid  DVT prophylaxis: Supratherapeutic INR.   Code Status: Full code Family Communication: None at bedside Disposition Plan: Continue telemetry monitoring. PT evaluation. Home possibly in next 72 hrs if abdominal pain and diarrhea improves and tolerating advanced diet.   Consultants:  None  Procedures:  CT abdomen and pelvis  Antibiotics:  IV Unasyn and Flagyl since 9/9  HPI/Subjective: Still  has lower abdominal pain. Afebrile. Reports the episodes of diarrhea yesterday.  Objective: Filed Vitals:   02/16/15 0604  BP: 157/79  Pulse: 68  Temp: 98.2 F (36.8 C)  Resp: 17    Intake/Output Summary (Last 24 hours) at 02/16/15 1248 Last data filed at 02/15/15 2330  Gross per 24 hour  Intake 3093.75 ml  Output    200 ml  Net 2893.75 ml   Filed Weights   02/14/15 1733  Weight: 77.4 kg (170 lb 10.2 oz)    Exam:   General: Elderly female lying in bed in no acute distress  HEENT:  moist  oral mucosa, supple neck  Chest: Clear to auscultation bilaterally  CVS: S1 and S2 normal, no murmurs rub or  gallop  GI: Soft, nondistended, laparotomy scar+, bowel sounds present, tender to pressure over infraumbilical area  Musculoskeletal: Warm, no edema  CNS: Alert and oriented, expressive aphasia, right-sided weakness (chronic)  Data Reviewed: Basic Metabolic Panel:  Recent Labs Lab 02/13/15 2154 02/14/15 1955 02/15/15 0520 02/16/15 0515  NA 140 140 141 138  K 3.4* 3.3* 3.2* 2.9*  CL 109 113* 113* 107  CO2 21* 19* 20* 24  GLUCOSE 152* 99 84 77  BUN 13 11 11  <5*  CREATININE 0.67 0.70 0.65 0.56  CALCIUM 9.4 8.2* 8.5* 8.1*  MG  --  1.0*  --  1.2*   Liver Function Tests:  Recent Labs Lab 02/13/15 2154  AST 28  ALT 19  ALKPHOS 67  BILITOT 0.6  PROT 7.3  ALBUMIN 3.8    Recent Labs Lab 02/13/15 2154  LIPASE 21*   No results for input(s): AMMONIA in the last 168 hours. CBC:  Recent Labs Lab 02/13/15 2154 02/14/15 1955 02/15/15 0520  WBC 15.7* 10.1 12.9*  NEUTROABS 9.6*  --   --   HGB 12.7 12.2 12.1  HCT 38.4 38.3 37.6  MCV 91.2 92.3 91.5  PLT 290 211 210   Cardiac Enzymes: No results for input(s): CKTOTAL, CKMB, CKMBINDEX, TROPONINI in the last 168 hours. BNP (last 3 results)  Recent Labs  02/14/15 1955  BNP 53.0    ProBNP (last 3 results) No results for input(s): PROBNP in the last 8760 hours.  CBG:  Recent Labs Lab 02/15/15 0825 02/15/15 2008 02/16/15 0016 02/16/15 0427 02/16/15 0801  GLUCAP 83 93 93 82 78    Recent Results (from the past 240 hour(s))  Culture, blood (x 2)     Status: None (Preliminary result)   Collection Time: 02/14/15 11:10 AM  Result Value Ref Range Status   Specimen Description BLOOD LEFT FINGER  Final   Special Requests IN PEDIATRIC BOTTLE 3ML  Final   Culture   Final    NO GROWTH < 24 HOURS Performed at [facility]    Report Status PENDING  Incomplete  C difficile quick screen w PCR reflex     Status: Abnormal   Collection Time: 02/14/15 12:28 PM  Result Value Ref Range Status   C Diff antigen  POSITIVE (A) NEGATIVE Final   C Diff toxin NEGATIVE NEGATIVE Final   C Diff interpretation   Final    C. difficile present, but toxin not detected. This indicates colonization. In most cases, this does not require treatment. If patient has signs and symptoms consistent with colitis, consider treatment.  Urine culture     Status: None   Collection Time: 02/14/15  6:21 PM  Result Value Ref Range Status   Specimen Description URINE, CLEAN CATCH  Final   Special Requests NONE  Final   Culture   Final    NO GROWTH 2 DAYS Performed at [facility]    Report Status 02/16/2015 FINAL  Final  Culture, blood (x 2)     Status: None (Preliminary result)   Collection Time: 02/14/15  7:55 PM  Result Value Ref Range Status   Specimen Description BLOOD RIGHT ARM  Final   Special Requests BOTTLES DRAWN AEROBIC AND ANAEROBIC 5CC  Final   Culture   Final    NO GROWTH < 12 HOURS Performed at [facility]    Report Status  PENDING  Incomplete     Studies: Ct Chest W Contrast  02/15/2015   CLINICAL DATA:  Left lung nodule.  EXAM: CT CHEST WITH CONTRAST  TECHNIQUE: Multidetector CT imaging of the chest was performed during intravenous contrast administration.  CONTRAST:  64mL OMNIPAQUE IOHEXOL 300 MG/ML  SOLN  COMPARISON:  Chest radiograph of February 14, 2015.  FINDINGS: No pneumothorax or pleural effusion is noted. Minimal biapical scarring is noted. Mild nodular pleural thickening is noted in the right lung base with associated parenchymal densities most consistent with scarring. No discrete pulmonary nodule or mass is noted. No acute pulmonary disease is noted. Atherosclerosis of thoracic aorta is noted without aneurysm or dissection. Mild enlargement of pulmonary arteries are noted centrally suggesting pulmonary artery hypertension. Coronary artery calcifications are noted. No mediastinal mass or adenopathy is noted. 1.8 cm irregular low density is noted in left thyroid lobe. 2.5 cm mass or  nodule arises anteriorly from the isthmus of the thyroid gland.  In the visualized portion of the upper abdomen, the patient is status post cholecystectomy. Bilateral renal atrophy is noted consistent with renal insufficiency.  IMPRESSION: No discrete pulmonary nodule or mass is noted. No mediastinal mass or adenopathy is noted.  Coronary artery calcifications are noted suggesting coronary artery disease.  2.5 cm solid nodule is seen arising anteriorly from the isthmus of the thyroid gland, with 1.8 cm irregular density seen in left thyroid lobe. Thyroid ultrasound is recommended for further evaluation.   Electronically Signed   By: Marijo Conception, M.D.   On: 02/15/2015 15:01   Dg Chest Port 1 View  02/14/2015   CLINICAL DATA:  Evaluate central line placement  EXAM: PORTABLE CHEST - 1 VIEW  COMPARISON:  10/27/2012  FINDINGS: Left internal jugular central line identified with tip just above the cavoatrial junction. No pneumothorax.  Heart size and vascular pattern are normal. Irregular opacity measuring about 4 cm over the left upper lobe new from prior studies. 5 mm nodular opacity lateral inferior right lobe also new. All  IMPRESSION: 1.  Central line as described.  2. Consider CT thorax with contrast if possible, to evaluate new nodular density right lower lobe and new irregular 4cm left upper lobe opacity, which could represent a lung mass.   Electronically Signed   By: Skipper Cliche M.D.   On: 02/14/2015 18:02    Scheduled Meds: . ampicillin-sulbactam (UNASYN) IV  3 g Intravenous Q6H  . calcitRIOL  0.25 mcg Oral Daily  . chlorhexidine  15 mL Mouth/Throat BID  . cholecalciferol  1,000 Units Oral Daily  . diltiazem  240 mg Oral Daily  . famotidine  20 mg Oral BID  . folic acid  1 mg Oral Daily  . insulin aspart  0-9 Units Subcutaneous 6 times per day  . magnesium oxide  400 mg Oral BID  . metronidazole  500 mg Intravenous Q8H  . multivitamin  1 tablet Oral QHS  . mycophenolate  250 mg Oral BID   . oxybutynin  5 mg Oral TID  . potassium chloride  40 mEq Oral Q4H  . predniSONE  5 mg Oral Q breakfast  . sertraline  100 mg Oral Daily  . sodium chloride  3 mL Intravenous Q12H  . tacrolimus  1 mg Oral BID  . warfarin  2.5 mg Oral ONCE-1800  . Warfarin - Pharmacist Dosing Inpatient   Does not apply q1800   Continuous Infusions: . sodium chloride 100 mL/hr at 02/16/15 0306  Time spent: 25 minutes    Louellen Molder  [facility] Pager [phone] If 7PM-7AM, please contact night-coverage at [email or web address], password [facility] 02/16/2015, 12:48 PM  LOS: 2 days

## 2015-02-16 NOTE — Progress Notes (Signed)
Utilization Review Completed.Connie Ruiz T9/04/2015  

## 2015-02-16 NOTE — Evaluation (Signed)
Physical Therapy Evaluation Patient Details Name: Connie Ruiz MRN: [id number] DOB: 19-Aug-1960 Today's Date: 02/16/2015   History of Present Illness  Patient is a 54 y/o female admitted with abdominal pain  PMH positive for antiphospholipid syndrome, renal transplant, CVA with R residual deficits, DVT w/ PE, DM, lupus.  Clinical Impression  Pt admitted with above diagnosis. Pt currently with functional limitations due to the deficits listed below (see PT Problem List).  Pt will benefit from skilled PT to increase their independence and safety with mobility to allow discharge to the venue listed below.       Follow Up Recommendations Home health PT    Equipment Recommendations  None recommended by PT    Recommendations for Other Services       Precautions / Restrictions Precautions Precautions: Fall      Mobility  Bed Mobility Overal bed mobility: Needs Assistance Bed Mobility: Supine to Sit;Sit to Supine     Supine to sit: Min assist Sit to supine: Min assist   General bed mobility comments: assist with trunk to come to sitting, LEs into bed, incr time  Transfers Overall transfer level: Needs assistance Equipment used: 1 person hand held assist Transfers: Sit to/from [employer] Sit to Stand: Mod assist Stand pivot transfers: Min assist;Mod assist       General transfer comment: cues for safety. pt declines use of AD  Ambulation/Gait             General Gait Details: pt declined  Financial trader Rankin (Stroke Patients Only)       Balance Overall balance assessment: Needs assistance Sitting-balance support: No upper extremity supported;Feet supported Sitting balance-Leahy Scale: Fair     Standing balance support: During functional activity;Single extremity supported Standing balance-Leahy Scale: Poor                               Pertinent Vitals/Pain Pain Assessment:  Faces Pain Location: abd Pain Descriptors / Indicators: Grimacing;Moaning Pain Intervention(s): Limited activity within patient's tolerance;Monitored during session    Home Living Family/patient expects to be discharged to:: Private residence   Available Help at Discharge: Personal care attendant Type of Home: Apartment Home Access: Level entry     Home Layout: One level Home Equipment: Environmental consultant - 2 wheels;Cane - single point;Bedside commode;Wheelchair - manual;Grab bars - tub/shower;Grab bars - toilet Additional Comments: aide comes 3-5 hrs 7d/wk    Prior Function Level of Independence: Needs assistance   Gait / Transfers Assistance Needed: amb with cane or RW  ADL's / Homemaking Assistance Needed: assist for bathing and occasionally for dressing/cooking, etc  Comments: occasional word finding difficulty/expressive aphasia     Hand Dominance        Extremity/Trunk Assessment   Upper Extremity Assessment: Defer to OT evaluation           Lower Extremity Assessment: Generalized weakness         Communication   Communication: No difficulties  Cognition Arousal/Alertness: Awake/alert Behavior During Therapy: WFL for tasks assessed/performed Overall Cognitive Status: No family/caregiver present to determine baseline cognitive functioning                      General Comments      Exercises        Assessment/Plan    PT Assessment Patient needs continued  PT services  PT Diagnosis Difficulty walking   PT Problem List Decreased strength;Decreased activity tolerance;Decreased balance;Decreased mobility  PT Treatment Interventions DME instruction;Gait training;Functional mobility training;Therapeutic activities;Patient/family education;Therapeutic exercise;Balance training   PT Goals (Current goals can be found in the Care Plan section) Acute Rehab PT Goals Patient Stated Goal: to go home PT Goal Formulation: With patient Time For Goal Achievement:  02/23/15 Potential to Achieve Goals: Good    Frequency Min 3X/week   Barriers to discharge        Co-evaluation               End of Session Equipment Utilized During Treatment: Gait belt Activity Tolerance: Patient tolerated treatment well Patient left: in bed;with call bell/phone within reach Nurse Communication: Mobility status         Time: [id number] PT Time Calculation (min) (ACUTE ONLY): 22 min   Charges:   PT Evaluation $Initial PT Evaluation Tier I: 1 Procedure     PT G CodesKenyon Ana 03/04/2015, 2:26 PM

## 2015-02-16 NOTE — Progress Notes (Addendum)
ANTICOAGULATION CONSULT NOTE - Initial Consult  Pharmacy Consult for Coumadin Indication: hx PE, +antiphospholipid syndrome  Allergies  Allergen Reactions  . Oxycodone-Acetaminophen Shortness Of Breath and Nausea Only  . Propoxyphene N-Acetaminophen Shortness Of Breath and Nausea Only  . Sulfonamide Derivatives Shortness Of Breath and Nausea Only  . Codeine Nausea Only  . Latex Rash  . Metoprolol Rash  . Morphine And Related Rash    IV site on arm is red, patient reports this is improving.  NO shortness of breath reported.    Patient Measurements: Height: 5\' 4"  (162.6 cm) Weight: 170 lb 10.2 oz (77.4 kg) IBW/kg (Calculated) : 54.7  Vital Signs: Temp: 98.2 F (36.8 C) (09/11 0604) Temp Source: Oral (09/11 0604) BP: 157/79 mmHg (09/11 0604) Pulse Rate: 68 (09/11 0604)  Labs:  Recent Labs  02/13/15 2154 02/14/15 0550 02/14/15 1955 02/14/15 2045 02/15/15 0520 02/16/15 0515  HGB 12.7  --  12.2  --  12.1  --   HCT 38.4  --  38.3  --  37.6  --   PLT 290  --  211  --  210  --   APTT  --   --   --  70*  --   --   LABPROT  --  50.8*  --   --  45.7* 24.9*  INR  --  5.89*  --   --  5.11* 2.28*  CREATININE 0.67  --  0.70  --  0.65 0.56    Estimated Creatinine Clearance: 81 mL/min (by C-G formula based on Cr of 0.56).   Medical History: Past Medical History  Diagnosis Date  . THYROID NODULE, LEFT 04/10/2009  . HYPERLIPIDEMIA 08/21/2007  . GOUT 08/21/2007  . HYPERTENSION 08/21/2007    Dr. Andree Elk, [place] 08/21/2007  . CEREBROVASCULAR ACCIDENT, ACUTE 04/15/2010  . GERD 08/21/2007  . RENAL INSUFFICIENCY 08/21/2007  . LUPUS 08/21/2007  . OSTEOPOROSIS 08/21/2007    Rheumatol at [facility]  . DVT, HX OF 08/21/2007  . CLOSTRIDIUM DIFFICILE COLITIS, HX OF 08/21/2007  . KIDNEY TRANSPLANTATION, HX OF 08/22/2007    s/p renal [facility] Andree [facility]k, [facility]  . Pulmonary embolism 07/16/2010  . Renal failure   . Current use of long term anticoagulation     Dr.  Andree Elk, [facility]  . Depression     Dr. Andree Elk, [facility]  . History of stroke with residual effects   . Right sided weakness   . CVA 04/17/2010  . Tachycardia   . DIABETES MELLITUS, TYPE II 08/21/2007  . Candida esophagitis 11/12/2014  . Steroid-induced hyperglycemia 11/09/2014    Medications:  PTA Coumadin dose: 5mg  po daily  Assessment: 54 yo F on admitted with sepsis, colitis.  She is on chronic Coumadin for hx PE, +antiphospholipid syndrome.  She has been on Coumadin 5mg  po daily ~ 2 weeks per outpatient [facility] flowsheet.  INR was 5.3 on 9/2.  She was instructed to skip doses x 2 days then resume 5mg  daily at that time.   INR remains elevated on admission INR = 5.89).  Last dose 9/7. CBC ok, no bleeding noted.  Today, 02/16/2015: - INR decreased from 5.11 to 2.28 s/p reversal with vit K (vit K 5mg  SQ on 9/9, 10mg  SQ on 9/10) - cbc stable - No bleeding documented - Major Drug Interactions: Unasyn/Flagyl for colitis - Diet: clear liquid  Goal of Therapy:  INR 2-3   Plan:  - INR now within therapeutic range, will resume Coumadin today with 2.5 mg  PO x1 - Daily INR - monitor for s/s bleeding  Alys Dulak P 02/16/2015,10:34 AM  Adden: Per Dr. Tyrell Antonio, with sub-therapeutic INR, bridge with lovenox until INR is therapeutic. - will start lovenox 75mg  SQ q12h. Dia Sitter, PharmD, BCPS 02/17/2015 2:33 PM

## 2015-02-17 ENCOUNTER — Inpatient Hospital Stay (HOSPITAL_COMMUNITY): Payer: Medicare Other

## 2015-02-17 DIAGNOSIS — Z7901 Long term (current) use of anticoagulants: Secondary | ICD-10-CM

## 2015-02-17 LAB — GLUCOSE, CAPILLARY
Glucose-Capillary: 127 mg/dL — ABNORMAL HIGH (ref 65–99)
Glucose-Capillary: 130 mg/dL — ABNORMAL HIGH (ref 65–99)
Glucose-Capillary: 136 mg/dL — ABNORMAL HIGH (ref 65–99)
Glucose-Capillary: 78 mg/dL (ref 65–99)
Glucose-Capillary: 79 mg/dL (ref 65–99)
Glucose-Capillary: 89 mg/dL (ref 65–99)

## 2015-02-17 LAB — CBC
HCT: 35.6 % — ABNORMAL LOW (ref 36.0–46.0)
Hemoglobin: 11.7 g/dL — ABNORMAL LOW (ref 12.0–15.0)
MCH: 29.1 pg (ref 26.0–34.0)
MCHC: 32.9 g/dL (ref 30.0–36.0)
MCV: 88.6 fL (ref 78.0–100.0)
Platelets: 141 10*3/uL — ABNORMAL LOW (ref 150–400)
RBC: 4.02 MIL/uL (ref 3.87–5.11)
RDW: 13.3 % (ref 11.5–15.5)
WBC: 8.8 10*3/uL (ref 4.0–10.5)

## 2015-02-17 LAB — BASIC METABOLIC PANEL
Anion gap: 9 (ref 5–15)
BUN: <5 mg/dL — ABNORMAL LOW (ref 6–20)
CO2: 25 mmol/L (ref 22–32)
Calcium: 8.4 mg/dL — ABNORMAL LOW (ref 8.9–10.3)
Chloride: 106 mmol/L (ref 101–111)
Creatinine, Ser: 0.45 mg/dL (ref 0.44–1.00)
GFR calc Af Amer: >60 mL/min (ref >60)
GFR calc non Af Amer: >60 mL/min (ref >60)
Glucose, Bld: 78 mg/dL (ref 65–99)
Potassium: 3.7 mmol/L (ref 3.5–5.1)
Sodium: 140 mmol/L (ref 135–145)

## 2015-02-17 LAB — PROTIME-INR
INR: 1.41 (ref 0.00–1.49)
Prothrombin Time: 17.3 seconds — ABNORMAL HIGH (ref 11.6–15.2)

## 2015-02-17 LAB — MAGNESIUM: Magnesium: 1.5 mg/dL — ABNORMAL LOW (ref 1.7–2.4)

## 2015-02-17 LAB — T3, FREE: T3, Free: 2.3 pg/mL (ref 2.0–4.4)

## 2015-02-17 MED ORDER — ENOXAPARIN SODIUM 80 MG/0.8ML ~~LOC~~ SOLN
75.0000 mg | SUBCUTANEOUS | Status: AC
  Administered 2015-02-17: 75 mg via SUBCUTANEOUS
  Filled 2015-02-17: qty 0.8

## 2015-02-17 MED ORDER — ENOXAPARIN SODIUM 80 MG/0.8ML ~~LOC~~ SOLN
75.0000 mg | Freq: Two times a day (BID) | SUBCUTANEOUS | Status: DC
  Administered 2015-02-18 – 2015-02-21 (×8): 75 mg via SUBCUTANEOUS
  Filled 2015-02-17 (×10): qty 0.8

## 2015-02-17 MED ORDER — WARFARIN SODIUM 5 MG PO TABS
5.0000 mg | ORAL_TABLET | Freq: Once | ORAL | Status: AC
  Administered 2015-02-17: 5 mg via ORAL
  Filled 2015-02-17: qty 1

## 2015-02-17 MED ORDER — MAGNESIUM SULFATE 2 GM/50ML IV SOLN
2.0000 g | Freq: Once | INTRAVENOUS | Status: AC
Start: 2015-02-17 — End: 2015-02-17
  Administered 2015-02-17: 2 g via INTRAVENOUS
  Filled 2015-02-17: qty 50

## 2015-02-17 MED ORDER — ENOXAPARIN SODIUM 80 MG/0.8ML ~~LOC~~ SOLN
75.0000 mg | Freq: Once | SUBCUTANEOUS | Status: AC
  Administered 2015-02-18: 75 mg via SUBCUTANEOUS
  Filled 2015-02-17: qty 0.8

## 2015-02-17 NOTE — Progress Notes (Signed)
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 In the ED patient found to be septic with tachycardia, WBC 15.7. Mild hypokalemia. She had supratherapeutic INR of 5.89. UA positive for UTI. Remaining blood work pending. CT of the abdomen and pelvis showed inflammatory changes in the colon and jejunum suggestive of enteritis and colitis. Also showed gas wall thickening in the bladder suggestive of cystitis. Patient admitted to telemetry on empiric antibiotics.  Assessment/Plan: Sepsis Secondary to enteritis/colitis and UTI. -Resolved. Afebrile. Lactic acid normal.  -Preliminary blood cultures negative. Urine cultures: no growth.  - Empiric Unasyn and Flagyl, day 4.  -C. difficile antigen positive toxin negative. Will follow GI pathogen./  -  GI pathogen panel pending. -Abdominal pain better 5/10. Diarrhea improved.   Anti-phospholipid syndrome/history of PE on chronic anticoagulation with warfarin INR supratherapeutic on admission. No signs of bleeding. Warfarin held and received vitamin K.  Coumadin per pharmacy.  Will consider adding  Lovenox. High risk for thrombotic event. Lovenox per pharmacy  Irregular right lung  lobe nodular density Seen on chest x-ray with a 4 cm left upper lobe and 5 mm inferior right lower lobe. CT of the chest with contrast was negative. Showed a incidental 2.5 cm solid nodule arising from the thyroid isthmus and 1.8 cm irregular density in the left thyroid lobe.  - TSH 2.1, free T3 2.3 and  T 4 1.33 just mildly elevated.  - Thyroid ultrasound pending.   Hypokalemia/hypomagnesemia  k normal.  Replete mg.   Essential hypertension Continue Cardizem. Hold losartan for now.  Chronic diastolic CHF 2-D echo from 06/2013 with EF of 60-65% and grade 1 diastolic dysfunction. Currently hypovolemic.  GERD Continue Pepcid.  History of anti-phospholipid syndrome On chronic anticoagulation. Continue with coumadin.   Diabetes mellitus type 2 Monitor on sliding scale insulin.  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 On Coumadin. Ordered PT evaluation.  History of renal transplant Continue CellCept, prednisone and Prograf. Check Prograf level. Renal function stable. Follows with nephrologist at [facility]   Poor IV access Appreciate PCCM consulted for central line placement. Has left IJ.  Diet: bland diet.   DVT prophylaxis: Supratherapeutic INR.   Code Status: Full code Family Communication: None at bedside Disposition Plan: Continue telemetry monitoring. PT evaluation. Home possibly in next 72 hrs if abdominal pain and diarrhea improves and tolerating advanced diet.   Consultants:  None  Procedures:  CT abdomen and pelvis  Antibiotics:  IV Unasyn and Flagyl since 9/9  HPI/Subjective: Abdominal pain better 5/10. She wants to eat fried chicken. Diarrhea better.   Objective: Filed Vitals:   02/17/15 0651  BP: 159/73  Pulse: 70  Temp: 99 F (37.2 C)  Resp: 16    Intake/Output Summary (Last 24 hours) at 02/17/15 1424 Last data filed at 02/17/15 1055  Gross per 24 hour  Intake 631.25 ml  Output      0 ml  Net 631.25 ml   Filed Weights   02/14/15 1733  Weight: 77.4 kg (170 lb 10.2 oz)    Exam:   General: Elderly female  lying in bed in no acute distress  HEENT:  moist oral mucosa, supple neck  Chest: Clear to auscultation bilaterally  CVS: S1 and S2 normal, no murmurs rub or gallop  GI: Soft, nondistended,  laparotomy scar+, bowel sounds present, tender to pressure over infraumbilical area  Musculoskeletal: Warm, no edema  CNS: Alert and oriented, expressive aphasia, right-sided weakness (chronic)  Data Reviewed: Basic Metabolic Panel:  Recent Labs Lab 02/13/15 2154 02/14/15 1955 02/15/15 0520 02/16/15 0515 02/17/15 0513  NA 140 140 141 138 140  K 3.4* 3.3* 3.2* 2.9* 3.7  CL 109 113* 113* 107 106  CO2 21* 19* 20* 24 25  GLUCOSE 152* 99 84 77 78  BUN 13 11 11  <5* <5*  CREATININE 0.67 0.70 0.65 0.56 0.45  CALCIUM 9.4 8.2* 8.5* 8.1* 8.4*  MG  --  1.0*  --  1.2* 1.5*   Liver Function Tests:  Recent Labs Lab 02/13/15 2154  AST 28  ALT 19  ALKPHOS 67  BILITOT 0.6  PROT 7.3  ALBUMIN 3.8    Recent Labs Lab 02/13/15 2154  LIPASE 21*   No results for input(s): AMMONIA in the last 168 hours. CBC:  Recent Labs Lab 02/13/15 2154 02/14/15 1955 02/15/15 0520  WBC 15.7* 10.1 12.9*  NEUTROABS 9.6*  --   --   HGB 12.7 12.2 12.1  HCT 38.4 38.3 37.6  MCV 91.2 92.3 91.5  PLT 290 211 210   Cardiac Enzymes: No results for input(s): CKTOTAL, CKMB, CKMBINDEX, TROPONINI in the last 168 hours. BNP (last 3 results)  Recent Labs  02/14/15 1955  BNP 53.0    ProBNP (last 3 results) No results for input(s): PROBNP in the last 8760 hours.  CBG:  Recent Labs Lab 02/16/15 2034 02/17/15 0010 02/17/15 0352 02/17/15 0757 02/17/15 1217  GLUCAP 102* 78 89 79 130*    Recent Results (from the past 240 hour(s))  Culture, blood (x 2)     Status: None (Preliminary result)   Collection Time: 02/14/15 11:10 AM  Result Value Ref Range Status   Specimen Description BLOOD LEFT FINGER  Final   Special Requests IN PEDIATRIC BOTTLE 3ML  Final   Culture   Final    NO GROWTH 3 DAYS Performed at [facility]    Report Status PENDING  Incomplete  C difficile quick screen w PCR reflex     Status: Abnormal   Collection Time: 02/14/15 12:28 PM  Result Value Ref Range Status    C Diff antigen POSITIVE (A) NEGATIVE Final   C Diff toxin NEGATIVE NEGATIVE Final   C Diff interpretation   Final    C. difficile present, but toxin not detected. This indicates colonization. In most cases, this does not require treatment. If patient has signs and symptoms consistent with colitis, consider treatment.  Urine culture     Status: None   Collection Time: 02/14/15  6:21 PM  Result Value Ref Range Status   Specimen Description URINE, CLEAN CATCH  Final   Special Requests NONE  Final   Culture   Final    NO GROWTH 2 DAYS Performed at [facility]    Report Status 02/16/2015 FINAL  Final  Culture, blood (x 2)     Status: None (Preliminary result)   Collection Time: 02/14/15  7:55 PM  Result Value Ref Range Status   Specimen Description BLOOD RIGHT ARM  Final   Special Requests BOTTLES DRAWN AEROBIC AND ANAEROBIC 5CC  Final   Culture  Final    NO GROWTH 3 DAYS Performed at [facility]    Report Status PENDING  Incomplete     Studies: US Soft Tissue Head/neck  02/17/2015   CLINICAL DATA:  Thyroid nodule with on recent CT chest  EXAM: THYROID ULTRASOUND  TECHNIQUE: Ultrasound examination of the thyroid gland and adjacent soft tissues was performed.  COMPARISON:  CT 02/15/2015 and earlier studies  FINDINGS: Right thyroid lobe  Measurements: 41 x 18 x 19 mm. 5 x 4 mm complex nodule, mid lobe. Smaller 3 mm hypoechoic nodule, inferior pole.  Left thyroid lobe  Measurements: 46 x 27 x 26 mm. Dominant 29 x 20 x 19 mm mostly solid nodule, mid lobe.  Isthmus  Thickness: 12 mm.  Solitary 25 x 17 x29 mm nodule, left of midline  Lymphadenopathy  None visualized.  IMPRESSION: 1. Normal-sized thyroid with multiple nodules. The dominant left and isthmic lesions meet consensus criteria for biopsy. Ultrasound-guided fine needle aspiration should be considered, as per the consensus statement: Management of Thyroid Nodules Detected at [place]: Society of Radiologists in [place]. Radiology 2005; [id number].   Electronically Signed   By: Lucrezia Europe M.D.   On: 02/17/2015 13:24    Scheduled Meds: . ampicillin-sulbactam (UNASYN) IV  3 g Intravenous Q6H  . calcitRIOL  0.25 mcg Oral Daily  . chlorhexidine  15 mL Mouth/Throat BID  . cholecalciferol  1,000 Units Oral Daily  . diltiazem  240 mg Oral Daily  . famotidine  20 mg Oral BID  . folic acid  1 mg Oral Daily  . insulin aspart  0-9 Units Subcutaneous 6 times per day  . metronidazole  500 mg Intravenous Q8H  . multivitamin  1 tablet Oral QHS  . mycophenolate  250 mg Oral BID  . oxybutynin  5 mg Oral TID  . predniSONE  5 mg Oral Q breakfast  . sertraline  100 mg Oral Daily  . sodium chloride  3 mL Intravenous Q12H  . tacrolimus  1 mg Oral BID  . warfarin  5 mg Oral ONCE-1800  . Warfarin - Pharmacist Dosing Inpatient   Does not apply q1800   Continuous Infusions: . sodium chloride 75 mL/hr at 02/17/15 1324      Time spent: 25 minutes    Jaiyla Granados A  [facility] Pager [phone] If 7PM-7AM, please contact night-coverage at [email or web address], password [facility] 02/17/2015, 2:24 PM  LOS: 3 days

## 2015-02-17 NOTE — Progress Notes (Signed)
ANTICOAGULATION and ANTIBIOTIC CONSULT NOTE - Initial Consult  Pharmacy Consult for Coumadin; unasyn Indication: hx PE, +antiphospholipid syndrome; colitis  Allergies  Allergen Reactions  . Oxycodone-Acetaminophen Shortness Of Breath and Nausea Only  . Propoxyphene N-Acetaminophen Shortness Of Breath and Nausea Only  . Sulfonamide Derivatives Shortness Of Breath and Nausea Only  . Codeine Nausea Only  . Latex Rash  . Metoprolol Rash  . Morphine And Related Rash    IV site on arm is red, patient reports this is improving.  NO shortness of breath reported.    Patient Measurements: Height: 5\' 4"  (162.6 cm) Weight: 170 lb 10.2 oz (77.4 kg) IBW/kg (Calculated) : 54.7  Vital Signs: Temp: 99 F (37.2 C) (09/12 0651) Temp Source: Oral (09/12 0651) BP: 159/73 mmHg (09/12 0651) Pulse Rate: 70 (09/12 0651)  Labs:  Recent Labs  02/14/15 1955 02/14/15 2045 02/15/15 0520 02/16/15 0515 02/17/15 0513  HGB 12.2  --  12.1  --   --   HCT 38.3  --  37.6  --   --   PLT 211  --  210  --   --   APTT  --  70*  --   --   --   LABPROT  --   --  45.7* 24.9* 17.3*  INR  --   --  5.11* 2.28* 1.41  CREATININE 0.70  --  0.65 0.56 0.45    Estimated Creatinine Clearance: 81 mL/min (by C-G formula based on Cr of 0.45).   Medical History: Past Medical History  Diagnosis Date  . THYROID NODULE, LEFT 04/10/2009  . HYPERLIPIDEMIA 08/21/2007  . GOUT 08/21/2007  . HYPERTENSION 08/21/2007    Dr. Andree Elk, [place] 08/21/2007  . CEREBROVASCULAR ACCIDENT, ACUTE 04/15/2010  . GERD 08/21/2007  . RENAL INSUFFICIENCY 08/21/2007  . LUPUS 08/21/2007  . OSTEOPOROSIS 08/21/2007    Rheumatol at [facility]  . DVT, HX OF 08/21/2007  . CLOSTRIDIUM DIFFICILE COLITIS, HX OF 08/21/2007  . KIDNEY TRANSPLANTATION, HX OF 08/22/2007    s/p renal transplant-Dr. Andree Elk, [facility]  . Pulmonary embolism 07/16/2010  . Renal failure   . Current use of long term anticoagulation     Dr. Andree Elk, [facility]  .  Depression     Dr. Andree Elk, [facility]  . History of stroke with residual effects   . Right sided weakness   . CVA 04/17/2010  . Tachycardia   . DIABETES MELLITUS, TYPE II 08/21/2007  . Candida esophagitis 11/12/2014  . Steroid-induced hyperglycemia 11/09/2014    Medications:  PTA Coumadin dose: 5mg  po daily  Assessment: 54 yo F on admitted with sepsis, colitis.  She is on chronic Coumadin for hx PE, +antiphospholipid syndrome.  She has been on Coumadin 5mg  po daily ~ 2 weeks per outpatient [facility] flowsheet.  INR was 5.3 on 9/2.  She was instructed to skip doses x 2 days then resume 5mg  daily at that time.   INR remains elevated on admission INR = 5.89).  Last dose 9/7. CBC ok, no bleeding noted.  Today, 02/17/2015: - INR continues to trend down to 1.41 today -->s/p reversal with vit K (vit K 5mg  SQ on 9/9, 10mg  SQ on 9/10 per MD) - cbc stable - No bleeding documented - Major Drug Interactions: Unasyn/Flagyl for colitis - Diet: clear liquid  Goal of Therapy:  INR 2-3   Plan:  - Coumadin 5 mg PO x1 today - Daily INR - monitor for s/s bleeding  _______________________  Patient's also on unasyn  and flagyl day # 3 for colitis/c diff with plan to treat for 10 days.  All cultures have been negative thus far.  She remains afebrile, wbc elevated on 9/10 at 12.9, and scr is stable at 0.45.  9/9 >> Unasyn >> 9/9 >>Flagyl>>  9/9 blood x2: ngtd 9/8 urine: neg FINAL 9/9: stool:  9/9: Cdiff: antigen (+), toxin (-) with diarrhea  Plan:  - continue Unasyn 3gm IV q6h  - continue flagyl 500mg  IV q8h per MD - F/U renal fxn, cx data  Mareo Portilla P 02/17/2015,9:45 AM

## 2015-02-17 NOTE — Care Management Important Message (Signed)
Important Message  Patient Details IM Letter given to [name]/ Case Manager to present to [place] Message  Patient Details  Name: Connie Ruiz MRN: [id number] Date of Birth: 07/17/1960   Medicare Important Message Given:  Yes-second notification given    Camillo Flaming 02/17/2015, 12:51 PM Name: Connie Ruiz MRN: [id number] Date of Birth: 05/14/1961   Medicare Important Message Given:  Yes-second notification given    Camillo Flaming 02/17/2015, 12:50 PM

## 2015-02-17 NOTE — Evaluation (Signed)
Occupational Therapy Evaluation Patient Details Name: Connie Ruiz MRN: [id number] DOB: [birth year] Today's Date: 02/17/2015    History of Present Illness Patient is a 54 y/o female admitted with abdominal pain  PMH positive for antiphospholipid syndrome, renal transplant, CVA with R residual deficits, DVT w/ PE, DM, lupus.   Clinical Impression   Pt admitted with abdomen pain. Pt currently with functional limitations due to the deficits listed below (see OT Problem List).  Pt will benefit from skilled OT to increase their safety and independence with ADL and functional mobility for ADL to facilitate discharge to venue listed below.      Follow Up Recommendations  Home health OT    Equipment Recommendations  None recommended by OT    Recommendations for Other Services       Precautions / Restrictions Precautions Precautions: Fall      Mobility Bed Mobility Overal bed mobility: Needs Assistance Bed Mobility: Supine to Sit;Sit to Supine     Supine to sit: Min assist Sit to supine: Min assist   General bed mobility comments: increased time  Transfers Overall transfer level: Needs assistance Equipment used: 1 person hand held assist Transfers: Sit to/from [facility] Sit to Stand: Min assist Stand pivot transfers: Min assist       General transfer comment: to [facility] and back         ADL Overall ADL's : Needs assistance/impaired     Grooming: Set up;Sitting   Upper Body Bathing: Sitting   Lower Body Bathing: Moderate assistance;Sit to/from stand   Upper Body Dressing : Minimal assistance;Sitting   Lower Body Dressing: Moderate assistance;Sit to/from stand   Toilet Transfer: Minimal assistance;BSC;Stand-pivot;Cueing for sequencing;Cueing for safety   Toileting- Clothing Manipulation and Hygiene: Sit to/from stand;Moderate assistance;Cueing for safety                         Pertinent Vitals/Pain Pain Score: 4  Pain Location:  abdomen Pain Descriptors / Indicators: Sore Pain Intervention(s): Monitored during session;Limited activity within patient's tolerance     Hand Dominance     Extremity/Trunk Assessment Upper Extremity Assessment Upper Extremity Assessment: Generalized weakness           Communication Communication Communication: No difficulties   Cognition Arousal/Alertness: Awake/alert Behavior During Therapy: WFL for tasks assessed/performed Overall Cognitive Status: Within Functional Limits for tasks assessed                                Home Living Family/patient expects to be discharged to:: Private residence   Available Help at Discharge: Personal care attendant Type of Home: Apartment Home Access: Level entry     Home Layout: One level               Home Equipment: Environmental consultant - 2 wheels;Cane - single point;Bedside commode;Wheelchair - manual;Grab bars - tub/shower;Grab bars - toilet   Additional Comments: aide comes 3-5 hrs 7d/wk      Prior Functioning/Environment Level of Independence: Needs assistance  Gait / Transfers Assistance Needed: amb with cane or RW ADL's / Homemaking Assistance Needed: assist for bathing and occasionally for dressing/cooking, etc        OT Diagnosis: Generalized weakness   OT Problem List: Decreased strength;Decreased activity tolerance;Pain   OT Treatment/Interventions: Self-care/ADL training;DME and/or AE instruction;Patient/family education    OT Goals(Current goals can be found in the care plan section) Acute Rehab OT  Goals Patient Stated Goal: to go home OT Goal Formulation: With patient Time For Goal Achievement: March 03, 2015  OT Frequency: Min 2X/week   Barriers to D/C:               End of Session Nurse Communication: Mobility status  Activity Tolerance: Patient tolerated treatment well Patient left: in bed;with call bell/phone within reach;with bed alarm set   Time: 1340-    Charges:  OT General  Charges $OT Visit: 1 Procedure OT Evaluation $Initial OT Evaluation Tier I: 1 Procedure G-Codes:    Betsy Pries 03-Mar-2015, 2:26 PM

## 2015-02-18 LAB — BASIC METABOLIC PANEL
Anion gap: 7 (ref 5–15)
BUN: <5 mg/dL — ABNORMAL LOW (ref 6–20)
CO2: 26 mmol/L (ref 22–32)
Calcium: 8.2 mg/dL — ABNORMAL LOW (ref 8.9–10.3)
Chloride: 107 mmol/L (ref 101–111)
Creatinine, Ser: 0.51 mg/dL (ref 0.44–1.00)
GFR calc Af Amer: >60 mL/min (ref >60)
GFR calc non Af Amer: >60 mL/min (ref >60)
Glucose, Bld: 69 mg/dL (ref 65–99)
Potassium: 3.5 mmol/L (ref 3.5–5.1)
Sodium: 140 mmol/L (ref 135–145)

## 2015-02-18 LAB — GI PATHOGEN PANEL BY PCR, STOOL
C difficile toxin A/B: NOT DETECTED
Campylobacter by PCR: NOT DETECTED
Cryptosporidium by PCR: NOT DETECTED
E coli (ETEC) LT/ST: NOT DETECTED
E coli (STEC): NOT DETECTED
E coli 0157 by PCR: NOT DETECTED
G lamblia by PCR: NOT DETECTED
Norovirus GI/GII: NOT DETECTED
Rotavirus A by PCR: NOT DETECTED
Salmonella by PCR: NOT DETECTED
Shigella by PCR: NOT DETECTED

## 2015-02-18 LAB — CBC
HCT: 35.9 % — ABNORMAL LOW (ref 36.0–46.0)
Hemoglobin: 12 g/dL (ref 12.0–15.0)
MCH: 30.1 pg (ref 26.0–34.0)
MCHC: 33.4 g/dL (ref 30.0–36.0)
MCV: 90 fL (ref 78.0–100.0)
Platelets: 145 10*3/uL — ABNORMAL LOW (ref 150–400)
RBC: 3.99 MIL/uL (ref 3.87–5.11)
RDW: 13.5 % (ref 11.5–15.5)
WBC: 9.5 10*3/uL (ref 4.0–10.5)

## 2015-02-18 LAB — GLUCOSE, CAPILLARY
Glucose-Capillary: 100 mg/dL — ABNORMAL HIGH (ref 65–99)
Glucose-Capillary: 169 mg/dL — ABNORMAL HIGH (ref 65–99)
Glucose-Capillary: 204 mg/dL — ABNORMAL HIGH (ref 65–99)
Glucose-Capillary: 76 mg/dL (ref 65–99)
Glucose-Capillary: 77 mg/dL (ref 65–99)
Glucose-Capillary: 81 mg/dL (ref 65–99)

## 2015-02-18 LAB — PROTIME-INR
INR: 1.37 (ref 0.00–1.49)
Prothrombin Time: 17 seconds — ABNORMAL HIGH (ref 11.6–15.2)

## 2015-02-18 LAB — MAGNESIUM: Magnesium: 1.7 mg/dL (ref 1.7–2.4)

## 2015-02-18 MED ORDER — METRONIDAZOLE 500 MG PO TABS
500.0000 mg | ORAL_TABLET | Freq: Three times a day (TID) | ORAL | Status: DC
  Filled 2015-02-18 (×3): qty 1

## 2015-02-18 MED ORDER — WARFARIN SODIUM 7.5 MG PO TABS
7.5000 mg | ORAL_TABLET | Freq: Once | ORAL | Status: AC
  Administered 2015-02-18: 7.5 mg via ORAL
  Filled 2015-02-18: qty 1

## 2015-02-18 MED ORDER — VANCOMYCIN 50 MG/ML ORAL SOLUTION
125.0000 mg | Freq: Four times a day (QID) | ORAL | Status: DC
  Administered 2015-02-18 – 2015-02-22 (×17): 125 mg via ORAL
  Filled 2015-02-18 (×20): qty 2.5

## 2015-02-18 NOTE — Progress Notes (Signed)
Physical Therapy Treatment Patient Details Name: Connie Ruiz MRN: [id number] DOB: [birth year] Today's Date: 02/18/2015    History of Present Illness Patient is a 54 y/o female admitted with abdominal pain  PMH positive for antiphospholipid syndrome, renal transplant, CVA with R residual deficits, DVT w/ PE, DM, lupus.    PT Comments    Pt in bed soaked in urine.  Required MAX encouragement to get OOB to amb to bathroom.  Very slow moving and required increased assist.  Pt declined the use of a walker and pushed it to the side as she furniture walked to the bathroom. Assisted with hygiene then amb her to the recliner.  RN came into room, reported pt's HR increased to 160's.  Very unsteady gait.  HIGH FALL RISK.    Follow Up Recommendations  Home health PT (per LPT eval )     Equipment Recommendations  None recommended by PT    Recommendations for Other Services       Precautions / Restrictions Precautions Precautions: Fall Restrictions Weight Bearing Restrictions: No    Mobility  Bed Mobility Overal bed mobility: Needs Assistance Bed Mobility: Supine to Sit     Supine to sit: Min assist     General bed mobility comments: increased time with difficulty scooting to EOB  Transfers Overall transfer level: Needs assistance Equipment used: 1 person hand held assist Transfers: Sit to/from Stand Sit to Stand: Min assist;Mod assist;Max assist         General transfer comment: Min assist off elevated bed but then Max Assist off lower level toilet.  Ambulation/Gait Ambulation/Gait assistance: Min assist Ambulation Distance (Feet): 5 Feet (6 feet to bathroom then another 9 feet to recliner. ) Assistive device: None   Gait velocity: decreased   General Gait Details: pt required MAX encoueragement to amb to bathroom vs use BSC.  Pt declined the use of a walker and just pushed it to the side as she helt to the edge of the bed to walk and reached for the bathroom door frame to  steady herslf.  Poor posture and unsteady gait.  HIGH FALL RISK.    Stairs            Wheelchair Mobility    Modified Rankin (Stroke Patients Only)       Balance                                    Cognition Arousal/Alertness: Awake/alert Behavior During Therapy: Flat affect                        Exercises      General Comments        Pertinent Vitals/Pain Pain Assessment: Faces Pain Location: all over Pain Descriptors / Indicators: Aching;Cramping;Grimacing Pain Intervention(s): Monitored during session    Home Living                      Prior Function            PT Goals (current goals can now be found in the care plan section) Progress towards PT goals: Progressing toward goals    Frequency  Min 3X/week    PT Plan      Co-evaluation             End of Session Equipment Utilized During Treatment: Gait belt Activity Tolerance: Patient tolerated treatment  well Patient left: in chair;with call bell/phone within reach     Time: 1415-1440 PT Time Calculation (min) (ACUTE ONLY): 25 min  Charges:  $Gait Training: 8-22 mins $Therapeutic Activity: 8-22 mins                    G Codes:      Rica Koyanagi  PTA [facility]  Acute  Rehab Pager      [phone]

## 2015-02-18 NOTE — Progress Notes (Signed)
[place] for Coumadin Indication: hx PE, +antiphospholipid syndrome  Allergies  Allergen Reactions  . Oxycodone-Acetaminophen Shortness Of Breath and Nausea Only  . Propoxyphene N-Acetaminophen Shortness Of Breath and Nausea Only  . Sulfonamide Derivatives Shortness Of Breath and Nausea Only  . Codeine Nausea Only  . Latex Rash  . Metoprolol Rash  . Morphine And Related Rash    IV site on arm is red, patient reports this is improving.  NO shortness of breath reported.    Patient Measurements: Height: 5\' 4"  (162.6 cm) Weight: 170 lb 10.2 oz (77.4 kg) IBW/kg (Calculated) : 54.7  Vital Signs: Temp: 99.8 F (37.7 C) (09/13 0436) Temp Source: Oral (09/13 0436) BP: 156/87 mmHg (09/13 0436) Pulse Rate: 83 (09/13 0436)  Labs:  Recent Labs  02/16/15 0515 02/17/15 0513 02/17/15 1430 02/18/15 0300  HGB  --   --  11.7* 12.0  HCT  --   --  35.6* 35.9*  PLT  --   --  141* 145*  LABPROT 24.9* 17.3*  --  17.0*  INR 2.28* 1.41  --  1.37  CREATININE 0.56 0.45  --   --     Estimated Creatinine Clearance: 81 mL/min (by C-G formula based on Cr of 0.45).   Medical History: Past Medical History  Diagnosis Date  . THYROID NODULE, LEFT 04/10/2009  . HYPERLIPIDEMIA 08/21/2007  . GOUT 08/21/2007  . HYPERTENSION 08/21/2007    Dr. Andree Elk, [place] 08/21/2007  . CEREBROVASCULAR ACCIDENT, ACUTE 04/15/2010  . GERD 08/21/2007  . RENAL INSUFFICIENCY 08/21/2007  . LUPUS 08/21/2007  . OSTEOPOROSIS 08/21/2007    Rheumatol at [facility]  . DVT, HX OF 08/21/2007  . CLOSTRIDIUM DIFFICILE COLITIS, HX OF 08/21/2007  . KIDNEY TRANSPLANTATION, HX OF 08/22/2007    s/p renal transplant-Dr. Andree Elk, [facility]  . Pulmonary embolism 07/16/2010  . Renal failure   . Current use of long term anticoagulation     Dr. Andree Elk, [facility]  . Depression     Dr. Andree Elk, [facility]  . History of stroke with residual effects   . Right sided weakness   . CVA 04/17/2010   . Tachycardia   . DIABETES MELLITUS, TYPE II 08/21/2007  . Candida esophagitis 11/12/2014  . Steroid-induced hyperglycemia 11/09/2014    Medications:  PTA Coumadin dose: 5mg  po daily  Assessment: 54 yo F on admitted with sepsis, colitis.  She is on chronic Coumadin for hx PE, +antiphospholipid syndrome.  She has been on Coumadin 5mg  po daily ~ 2 weeks per outpatient [facility] flowsheet.  INR was 5.3 on 9/2.  She was instructed to skip doses x 2 days then resume 5mg  daily at that time.   INR remains elevated on admission INR = 5.89).  Last dose 9/7. CBC ok, no bleeding noted.  Today, 02/18/2015: - INR continues to trend down despite resuming Coumadin s/p reversal with vit K (vit K 5mg  SQ on 9/9, 10mg  SQ on 9/10) - Hg stable; pltc decreased from admit but stable x 24hrs - No bleeding documented - Major Drug Interactions: Unasyn/Flagyl for colitis - Diet: soft, thin diet; ate ~ 75% yesterday  Goal of Therapy:  INR 2-3   Plan:  - Coumadin today with 7.5 mg PO x1 - Daily INR - monitor for s/s bleeding - Continue Lovenox 75mg  SQ q12h until INR is therapeutic - Change Flagyl to PO per P&T policy (see below); consider change to PO Vanc to complete course for better cure rate +  no interaction with warfarin  Netta Cedars, PharmD, BCPS Pager: [phone] 02/18/2015,8:11 AM  PHARMACIST - PHYSICIAN COMMUNICATION CONCERNING: Antibiotic IV to Oral Route Change Policy  RECOMMENDATION: This patient is receiving Flagyl by the intravenous route.  Based on criteria approved by the Pharmacy and Therapeutics Committee, the antibiotic(s) is/are being converted to the equivalent oral dose form(s).  DESCRIPTION: These criteria include:  Patient being treated for a respiratory tract infection, urinary tract infection, cellulitis or clostridium difficile associated diarrhea if on metronidazole  The patient is not neutropenic and does not exhibit a GI malabsorption state  The patient is eating (either  orally or via tube) and/or has been taking other orally administered medications for a least 24 hours  The patient is improving clinically and has a Tmax < 100.5  If you have questions about this conversion, please contact the Pharmacy Department  []   [phone] )  [facility] []   [phone] )  [facility] []   [phone] )  [facility] []   [phone] )  [facility] [x]   [phone] )  [facility]

## 2015-02-18 NOTE — Progress Notes (Signed)
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 In the ED patient found to be septic with tachycardia, WBC 15.7. Mild hypokalemia. She had supratherapeutic INR of 5.89. UA positive for UTI. Remaining blood work pending. CT of the abdomen and pelvis showed inflammatory changes in the colon and jejunum suggestive of enteritis and colitis. Also showed gas wall thickening in the bladder suggestive of cystitis. Patient admitted to telemetry on empiric antibiotics.  Assessment/Plan: Sepsis Secondary to enteritis/colitis and UTI. -Resolved. Afebrile. Lactic acid normal.  -Preliminary blood cultures negative. Urine cultures: no growth.  - Received Empiric Unasyn and Flagyl, for 5 days.  -C. difficile antigen positive toxin negative. Will follow GI pathogen./  -  GI pathogen panel pending. -Abdominal pain better 5/10. Diarrhea improved.  -discussed with ID will treat for presume C diff. Will change flagyl to vancomycin.  -needs 14 days treatment.   Anti-phospholipid syndrome/history of PE on chronic anticoagulation with warfarin INR supratherapeutic on admission. No signs of bleeding. Warfarin held and received vitamin K.  Coumadin per pharmacy.  Bridging with lovenox. INR at 1.3  Irregular right lung  lobe nodular density Seen on chest x-ray with a 4 cm left upper lobe and 5 mm inferior right lower lobe. CT of the chest with contrast was negative. Showed a incidental 2.5 cm solid nodule arising from the thyroid isthmus and  1.8 cm irregular density in the left thyroid lobe.  - TSH 2.1, free T3 2.3 and T 4 1.33 just mildly elevated.  - Thyroid ultrasound with nodules. She will need Biopsy outpatient.   Hypokalemia/hypomagnesemia  k normal.  Replete mg.   Essential hypertension Continue Cardizem. Hold losartan for now.  Chronic diastolic CHF 2-D echo from 06/2013 with EF of 60-65% and grade 1 diastolic dysfunction.   GERD Continue Pepcid.  History of anti-phospholipid syndrome On chronic anticoagulation. Continue with coumadin.   Diabetes mellitus type 2 Monitor on sliding scale insulin.  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 On Coumadin. Ordered PT evaluation.  History of renal transplant Continue CellCept, prednisone and Prograf. Prograf level. Renal function stable. Follows with nephrologist at [facility] Renal function stable.   Poor IV access Appreciate PCCM consulted for central line placement. Has left IJ.  Diet: bland diet.   DVT prophylaxis: Supratherapeutic INR.   Code Status: Full code Family Communication: None at bedside Disposition Plan: Continue telemetry monitoring. PT evaluation. Home possibly in next 72 hrs if abdominal pain and diarrhea improves and tolerating advanced diet.   Consultants:  None  Procedures:  CT abdomen and pelvis  Antibiotics:  IV Unasyn and Flagyl since 9/9  HPI/Subjective: Not eating a lot. Still with abdominal pain, 6/10. No BM today.   Objective: Filed Vitals:   02/18/15 0436  BP: 156/87  Pulse: 83  Temp: 99.8 F (37.7 C)  Resp: 18    Intake/Output Summary (Last 24 hours) at 02/18/15 1420 Last data filed at 02/18/15 0600  Gross per 24 hour  Intake 2942.5 ml  Output      0 ml  Net 2942.5 ml   Danley Danker  Weights   02/14/15 1733  Weight: 77.4 kg (170 lb 10.2 oz)    Exam:   General: Elderly female lying in bed in no acute distress  HEENT:  moist oral mucosa, supple neck  Chest: Clear to  auscultation bilaterally  CVS: S1 and S2 normal, no murmurs rub or gallop  GI: Soft, nondistended, laparotomy scar+, bowel sounds present, tender to pressure over infraumbilical area  Musculoskeletal: Warm, no edema  CNS: Alert and oriented, expressive aphasia, right-sided weakness (chronic)  Data Reviewed: Basic Metabolic Panel:  Recent Labs Lab 02/14/15 1955 02/15/15 0520 02/16/15 0515 02/17/15 0513 02/18/15 0300  NA 140 141 138 140 140  K 3.3* 3.2* 2.9* 3.7 3.5  CL 113* 113* 107 106 107  CO2 19* 20* 24 25 26   GLUCOSE 99 84 77 78 69  BUN 11 11 <5* <5* <5*  CREATININE 0.70 0.65 0.56 0.45 0.51  CALCIUM 8.2* 8.5* 8.1* 8.4* 8.2*  MG 1.0*  --  1.2* 1.5* 1.7   Liver Function Tests:  Recent Labs Lab 02/13/15 2154  AST 28  ALT 19  ALKPHOS 67  BILITOT 0.6  PROT 7.3  ALBUMIN 3.8    Recent Labs Lab 02/13/15 2154  LIPASE 21*   No results for input(s): AMMONIA in the last 168 hours. CBC:  Recent Labs Lab 02/13/15 2154 02/14/15 1955 02/15/15 0520 02/17/15 1430 02/18/15 0300  WBC 15.7* 10.1 12.9* 8.8 9.5  NEUTROABS 9.6*  --   --   --   --   HGB 12.7 12.2 12.1 11.7* 12.0  HCT 38.4 38.3 37.6 35.6* 35.9*  MCV 91.2 92.3 91.5 88.6 90.0  PLT 290 211 210 141* 145*   Cardiac Enzymes: No results for input(s): CKTOTAL, CKMB, CKMBINDEX, TROPONINI in the last 168 hours. BNP (last 3 results)  Recent Labs  02/14/15 1955  BNP 53.0    ProBNP (last 3 results) No results for input(s): PROBNP in the last 8760 hours.  CBG:  Recent Labs Lab 02/17/15 1953 02/18/15 0011 02/18/15 0428 02/18/15 0743 02/18/15 1310  GLUCAP 127* 81 77 76 100*    Recent Results (from the past 240 hour(s))  Culture, blood (x 2)     Status: None (Preliminary result)   Collection Time: 02/14/15 11:10 AM  Result Value Ref Range Status   Specimen Description BLOOD LEFT FINGER  Final   Special Requests IN PEDIATRIC BOTTLE 3ML  Final   Culture   Final    NO GROWTH 3 DAYS Performed at  [facility]    Report Status PENDING  Incomplete  C difficile quick screen w PCR reflex     Status: Abnormal   Collection Time: 02/14/15 12:28 PM  Result Value Ref Range Status   C Diff antigen POSITIVE (A) NEGATIVE Final   C Diff toxin NEGATIVE NEGATIVE Final   C Diff interpretation   Final    C. difficile present, but toxin not detected. This indicates colonization. In most cases, this does not require treatment. If patient has signs and symptoms consistent with colitis, consider treatment.  Urine culture     Status: None   Collection Time: 02/14/15  6:21 PM  Result Value Ref Range Status   Specimen Description URINE, CLEAN CATCH  Final   Special Requests NONE  Final   Culture   Final    NO GROWTH 2 DAYS Performed at [facility]    Report Status 02/16/2015 FINAL  Final  Culture, blood (x 2)     Status: None (  Preliminary result)   Collection Time: 02/14/15  7:55 PM  Result Value Ref Range Status   Specimen Description BLOOD RIGHT ARM  Final   Special Requests BOTTLES DRAWN AEROBIC AND ANAEROBIC 5CC  Final   Culture   Final    NO GROWTH 3 DAYS Performed at [facility]    Report Status PENDING  Incomplete  Stool culture     Status: None (Preliminary result)   Collection Time: 02/15/15  8:51 AM  Result Value Ref Range Status   Specimen Description STOOL  Final   Special Requests Immunocompromised  Final   Culture   Final    NO SUSPICIOUS COLONIES, CONTINUING TO HOLD Performed at [facility]    Report Status PENDING  Incomplete     Studies: US Soft Tissue Head/neck  02/17/2015   CLINICAL DATA:  Thyroid nodule with on recent CT chest  EXAM: THYROID ULTRASOUND  TECHNIQUE: Ultrasound examination of the thyroid gland and adjacent soft tissues was performed.  COMPARISON:  CT 02/15/2015 and earlier studies  FINDINGS: Right thyroid lobe  Measurements: 41 x 18 x 19 mm. 5 x 4 mm complex nodule, mid lobe. Smaller 3 mm hypoechoic nodule, inferior pole.   Left thyroid lobe  Measurements: 46 x 27 x 26 mm. Dominant 29 x 20 x 19 mm mostly solid nodule, mid lobe.  Isthmus  Thickness: 12 mm.  Solitary 25 x 17 x29 mm nodule, left of midline  Lymphadenopathy  None visualized.  IMPRESSION: 1. Normal-sized thyroid with multiple nodules. The dominant left and isthmic lesions meet consensus criteria for biopsy. Ultrasound-guided fine needle aspiration should be considered, as per the consensus statement: Management of Thyroid Nodules Detected at [place]: Society of Radiologists in [place]. Radiology 2005; [id number].   Electronically Signed   By: Lucrezia Europe M.D.   On: 02/17/2015 13:24    Scheduled Meds: . calcitRIOL  0.25 mcg Oral Daily  . chlorhexidine  15 mL Mouth/Throat BID  . cholecalciferol  1,000 Units Oral Daily  . diltiazem  240 mg Oral Daily  . enoxaparin (LOVENOX) injection  75 mg Subcutaneous Q12H  . famotidine  20 mg Oral BID  . folic acid  1 mg Oral Daily  . insulin aspart  0-9 Units Subcutaneous 6 times per day  . multivitamin  1 tablet Oral QHS  . mycophenolate  250 mg Oral BID  . oxybutynin  5 mg Oral TID  . predniSONE  5 mg Oral Q breakfast  . sertraline  100 mg Oral Daily  . sodium chloride  3 mL Intravenous Q12H  . tacrolimus  1 mg Oral BID  . vancomycin  125 mg Oral 4 times per day  . warfarin  7.5 mg Oral ONCE-1800  . Warfarin - Pharmacist Dosing Inpatient   Does not apply q1800   Continuous Infusions: . sodium chloride 75 mL/hr at 02/17/15 1324      Time spent: 25 minutes    Lorenz Donley A  [facility] Pager [phone] If 7PM-7AM, please contact night-coverage at [email or web address], password [facility] 02/18/2015, 2:20 PM  LOS: 4 days

## 2015-02-19 LAB — PROTIME-INR
INR: 1.57 — ABNORMAL HIGH (ref 0.00–1.49)
Prothrombin Time: 18.8 seconds — ABNORMAL HIGH (ref 11.6–15.2)

## 2015-02-19 LAB — CULTURE, BLOOD (ROUTINE X 2)
Culture: NO GROWTH
Culture: NO GROWTH

## 2015-02-19 LAB — BASIC METABOLIC PANEL
Anion gap: 6 (ref 5–15)
BUN: <5 mg/dL — ABNORMAL LOW (ref 6–20)
CO2: 28 mmol/L (ref 22–32)
Calcium: 8.8 mg/dL — ABNORMAL LOW (ref 8.9–10.3)
Chloride: 109 mmol/L (ref 101–111)
Creatinine, Ser: 0.59 mg/dL (ref 0.44–1.00)
GFR calc Af Amer: >60 mL/min (ref >60)
GFR calc non Af Amer: >60 mL/min (ref >60)
Glucose, Bld: 84 mg/dL (ref 65–99)
Potassium: 3.3 mmol/L — ABNORMAL LOW (ref 3.5–5.1)
Sodium: 143 mmol/L (ref 135–145)

## 2015-02-19 LAB — GLUCOSE, CAPILLARY
Glucose-Capillary: 117 mg/dL — ABNORMAL HIGH (ref 65–99)
Glucose-Capillary: 120 mg/dL — ABNORMAL HIGH (ref 65–99)
Glucose-Capillary: 130 mg/dL — ABNORMAL HIGH (ref 65–99)
Glucose-Capillary: 169 mg/dL — ABNORMAL HIGH (ref 65–99)
Glucose-Capillary: 82 mg/dL (ref 65–99)
Glucose-Capillary: 93 mg/dL (ref 65–99)

## 2015-02-19 LAB — STOOL CULTURE

## 2015-02-19 MED ORDER — WARFARIN SODIUM 5 MG PO TABS
5.0000 mg | ORAL_TABLET | Freq: Once | ORAL | Status: AC
  Administered 2015-02-19: 5 mg via ORAL
  Filled 2015-02-19: qty 1

## 2015-02-19 NOTE — Progress Notes (Signed)
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 In the ED patient found to be septic with tachycardia, WBC 15.7. Mild hypokalemia. She had supratherapeutic INR of 5.89. UA positive for UTI. Remaining blood work pending. CT of the abdomen and pelvis showed inflammatory changes in the colon and jejunum suggestive of enteritis and colitis. Also showed gas wall thickening in the bladder suggestive of cystitis. Patient admitted to telemetry on empiric antibiotics.  Assessment/Plan: Sepsis, Secondary to enteritis/colitis and UTI. -Resolved. Afebrile. Lactic acid normal.  -Preliminary blood cultures negative. Urine cultures: no growth.  - Received Empiric Unasyn and Flagyl, for 5 days.  -C. difficile antigen positive toxin negative. Will follow GI pathogen./  - GI pathogen panel negative.  -Abdominal pain better 5/10. Diarrhea improved.  -discussed with ID will treat for presume C diff. Will change flagyl to vancomycin.  -needs 14 days treatment.  Day 2 vancomycin. If diarrhea or pain not better tomorrow will consider CT scan or GI evaluation.   Anti-phospholipid syndrome/history of PE on chronic anticoagulation with warfarin INR supratherapeutic on admission. No signs of bleeding. Warfarin held and received vitamin K.  Coumadin per pharmacy.  Bridging with lovenox. INR at 1.3  Irregular right lung  lobe nodular density Seen on chest x-ray with a 4 cm left upper lobe and 5 mm inferior right lower lobe. CT of the chest with  contrast was negative. Showed a incidental 2.5 cm solid nodule arising from the thyroid isthmus and 1.8 cm irregular density in the left thyroid lobe.  - TSH 2.1, free T3 2.3 and T 4 1.33 just mildly elevated.  - Thyroid ultrasound with nodules. She will need Biopsy outpatient.   Hypokalemia/hypomagnesemia  k normal.  Replete mg.   Essential hypertension Continue Cardizem. Hold losartan for now.  Chronic diastolic CHF 2-D echo from 06/2013 with EF of 60-65% and grade 1 diastolic dysfunction.   GERD Continue Pepcid.  History of anti-phospholipid syndrome On chronic anticoagulation. Continue with coumadin.   Diabetes mellitus type 2 Monitor on sliding scale insulin.  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 On Coumadin. Ordered PT evaluation.  History of renal transplant Continue CellCept, prednisone and Prograf. Prograf level. Renal function stable. Follows with nephrologist at [facility] Renal function stable.   Poor IV access Appreciate PCCM consulted for central line placement. Has left IJ.  Diet: bland diet.   DVT prophylaxis: Supratherapeutic INR.   Code Status: Full code Family Communication: None at bedside Disposition Plan: home when diarrhea abdominal pain improved.    Consultants:  None  Procedures:  CT abdomen and pelvis  Antibiotics:  IV Unasyn and Flagyl since 9/9---9-13  HPI/Subjective: Patient relates 3 BM today/ still with abdominal pain, 5/10. Not eating a lot.   Objective: Filed Vitals:   02/19/15 0534  BP: 140/92  Pulse: 76  Temp: 98.8 F (37.1 C)  Resp: 18    Intake/Output Summary (Last 24 hours) at 02/19/15 1445 Last data filed at 02/19/15 1011  Gross per 24 hour  Intake    551 ml  Output    100 ml  Net    451 ml   Filed Weights   02/14/15 1733  Weight: 77.4 kg (170 lb 10.2 oz)    Exam:   General: Elderly female lying in bed in no acute distress  HEENT:  moist oral mucosa, supple  neck  Chest: Clear to auscultation bilaterally  CVS: S1 and S2 normal, no murmurs rub or gallop  GI: Soft, nondistended, laparotomy scar+, bowel sounds present, tender to pressure over infraumbilical area  Musculoskeletal: Warm, no edema  CNS: Alert and oriented, expressive aphasia, right-sided weakness (chronic)  Data Reviewed: Basic Metabolic Panel:  Recent Labs Lab 02/14/15 1955 02/15/15 0520 02/16/15 0515 02/17/15 0513 02/18/15 0300 02/19/15 0519  NA 140 141 138 140 140 143  K 3.3* 3.2* 2.9* 3.7 3.5 3.3*  CL 113* 113* 107 106 107 109  CO2 19* 20* 24 25 26 28   GLUCOSE 99 84 77 78 69 84  BUN 11 11 <5* <5* <5* <5*  CREATININE 0.70 0.65 0.56 0.45 0.51 0.59  CALCIUM 8.2* 8.5* 8.1* 8.4* 8.2* 8.8*  MG 1.0*  --  1.2* 1.5* 1.7  --    Liver Function Tests:  Recent Labs Lab 02/13/15 2154  AST 28  ALT 19  ALKPHOS 67  BILITOT 0.6  PROT 7.3  ALBUMIN 3.8    Recent Labs Lab 02/13/15 2154  LIPASE 21*   No results for input(s): AMMONIA in the last 168 hours. CBC:  Recent Labs Lab 02/13/15 2154 02/14/15 1955 02/15/15 0520 02/17/15 1430 02/18/15 0300  WBC 15.7* 10.1 12.9* 8.8 9.5  NEUTROABS 9.6*  --   --   --   --   HGB 12.7 12.2 12.1 11.7* 12.0  HCT 38.4 38.3 37.6 35.6* 35.9*  MCV 91.2 92.3 91.5 88.6 90.0  PLT 290 211 210 141* 145*   Cardiac Enzymes: No results for input(s): CKTOTAL, CKMB, CKMBINDEX, TROPONINI in the last 168 hours. BNP (last 3 results)  Recent Labs  02/14/15 1955  BNP 53.0    ProBNP (last 3 results) No results for input(s): PROBNP in the last 8760 hours.  CBG:  Recent Labs Lab 02/18/15 2109 02/19/15 0014 02/19/15 0401 02/19/15 0807 02/19/15 1241  GLUCAP 169* 120* 93 82 130*    Recent Results (from the past 240 hour(s))  Culture, blood (x 2)     Status: None (Preliminary result)   Collection Time: 02/14/15 11:10 AM  Result Value Ref Range Status   Specimen Description BLOOD LEFT FINGER  Final   Special Requests IN  PEDIATRIC BOTTLE 3ML  Final   Culture   Final    NO GROWTH 4 DAYS Performed at [facility]    Report Status PENDING  Incomplete  C difficile quick screen w PCR reflex     Status: Abnormal   Collection Time: 02/14/15 12:28 PM  Result Value Ref Range Status   C Diff antigen POSITIVE (A) NEGATIVE Final   C Diff toxin NEGATIVE NEGATIVE Final   C Diff interpretation   Final    C. difficile present, but toxin not detected. This indicates colonization. In most cases, this does not require treatment. If patient has signs and symptoms consistent with colitis, consider treatment.  Urine culture     Status: None   Collection Time: 02/14/15  6:21 PM  Result Value Ref Range Status   Specimen Description URINE, CLEAN CATCH  Final   Special Requests NONE  Final   Culture   Final    NO GROWTH 2 DAYS Performed at [facility]    Report Status 02/16/2015 FINAL  Final  Culture, blood (x 2)     Status: None (Preliminary result)   Collection Time: 02/14/15  7:55 PM  Result Value Ref Range Status   Specimen Description BLOOD RIGHT ARM  Final   Special Requests BOTTLES DRAWN AEROBIC AND ANAEROBIC 5CC  Final   Culture   Final    NO GROWTH 4 DAYS Performed at [facility]    Report Status PENDING  Incomplete  Stool culture     Status: None   Collection Time: 02/15/15  8:51 AM  Result Value Ref Range Status   Specimen Description STOOL  Final   Special Requests Immunocompromised  Final   Culture   Final    NO SALMONELLA, SHIGELLA, CAMPYLOBACTER, YERSINIA, OR E.COLI 0157:H7 ISOLATED Performed at [facility]    Report Status 02/19/2015 FINAL  Final     Studies: No results found.  Scheduled Meds: . calcitRIOL  0.25 mcg Oral Daily  . chlorhexidine  15 mL Mouth/Throat BID  . cholecalciferol  1,000 Units Oral Daily  . diltiazem  240 mg Oral Daily  . enoxaparin (LOVENOX) injection  75 mg Subcutaneous Q12H  . famotidine  20 mg Oral BID  . folic acid  1 mg Oral  Daily  . insulin aspart  0-9 Units Subcutaneous 6 times per day  . multivitamin  1 tablet Oral QHS  . mycophenolate  250 mg Oral BID  . oxybutynin  5 mg Oral TID  . predniSONE  5 mg Oral Q breakfast  . sertraline  100 mg Oral Daily  . sodium chloride  3 mL Intravenous Q12H  . tacrolimus  1 mg Oral BID  . vancomycin  125 mg Oral 4 times per day  . warfarin  5 mg Oral ONCE-1800  . Warfarin - Pharmacist Dosing Inpatient   Does not apply q1800   Continuous Infusions: . sodium chloride 50 mL/hr at 02/18/15 2223      Time spent: 25 minutes    Merissa Renwick A  [facility] Pager [phone] If 7PM-7AM, please contact night-coverage at [email or web address], password [facility] 02/19/2015, 2:45 PM  LOS: 5 days

## 2015-02-19 NOTE — Care Management Note (Signed)
Case Management Note  Patient Details  Name: Connie Ruiz MRN: [id number] Date of Birth: 06/16/1960  Subjective/Objective:        Admitted with sepsis            Action/Plan: Discharge planning, spoke with patient at discharge. Patient is agreeable to recommendations for [facility]. States she is active with [place]. Contacted [facility] for referral. Contacted attending for orders, will likely need PT, OT, and possibly RN for INR lab draws.   Expected Discharge Date:                  Expected Discharge Plan:  [place]  In-House Referral:  NA  Discharge planning Services  CM Consult  Post Acute Care Choice:  Home Health Choice offered to:  Patient  DME Arranged:  N/A DME Agency:  NA  HH Arranged:  PT, OT HH Agency:  [place]  Status of Service:  Completed, signed off  Medicare Important Message Given:  Yes-second notification given Date Medicare IM Given:    Medicare IM give by:    Date Additional Medicare IM Given:    Additional Medicare Important Message give by:     If discussed at [place] of Stay Meetings, dates discussed:    Additional Comments:  Guadalupe Maple, RN 02/19/2015, 2:53 PM

## 2015-02-19 NOTE — Progress Notes (Signed)
Occupational Therapy Treatment Patient Details Name: ANAYJAH ZAKRZEWSKI MRN: [id number] DOB: 07-10-1960 Today's Date: 02/19/2015    History of present illness Patient is a 54 y/o female admitted with abdominal pain  PMH positive for antiphospholipid syndrome, renal transplant, CVA with R residual deficits, DVT w/ PE, DM, lupus.   OT comments  Refused chair s/p toilet transfer  Follow Up Recommendations  Home health OT    Equipment Recommendations  None recommended by OT    Recommendations for Other Services      Precautions / Restrictions Precautions Precautions: Fall Restrictions Weight Bearing Restrictions: No       Mobility Bed Mobility Overal bed mobility: Needs Assistance Bed Mobility: Supine to Sit     Supine to sit: Min assist     General bed mobility comments: increased time with difficulty scooting to EOB  Transfers Overall transfer level: Needs assistance Equipment used: 1 person hand held assist Transfers: Sit to/from [facility] Sit to Stand: Mod assist Stand pivot transfers: Mod assist                ADL Overall ADL's : Needs assistance/impaired                         Toilet Transfer: Moderate assistance;BSC;Cueing for safety;Cueing for sequencing;Stand-pivot   Toileting- Clothing Manipulation and Hygiene: Maximal assistance;Sit to/from stand;Cueing for sequencing;Cueing for safety       Functional mobility during ADLs: Moderate assistance;Cueing for sequencing;Cueing for safety        Vision                            Cognition                                     General Comments      Pertinent Vitals/ Pain       Pain Score: 4  Pain Location: stomach Pain Descriptors / Indicators: Sore;Aching Pain Intervention(s): Monitored during session;Patient requesting pain meds-RN notified;Repositioned     Prior Functioning/Environment              Frequency       Progress Toward  Goals  OT Goals(current goals can now be found in the care plan section)  Progress towards OT goals: Progressing toward goals     Plan Discharge plan remains appropriate    Co-evaluation                 End of Session     Activity Tolerance Patient tolerated treatment well   Patient Left in bed   Nurse Communication Mobility status        Time: [id number] OT Time Calculation (min): 23 min  Charges: OT General Charges $OT Visit: 1 Procedure OT Treatments $Self Care/Home Management : 23-37 mins  Ieisha Gao D 02/19/2015, 10:10 AM

## 2015-02-19 NOTE — Progress Notes (Signed)
[place] for Coumadin Indication: hx PE, +antiphospholipid syndrome  Allergies  Allergen Reactions  . Oxycodone-Acetaminophen Shortness Of Breath and Nausea Only  . Propoxyphene N-Acetaminophen Shortness Of Breath and Nausea Only  . Sulfonamide Derivatives Shortness Of Breath and Nausea Only  . Codeine Nausea Only  . Latex Rash  . Metoprolol Rash  . Morphine And Related Rash    IV site on arm is red, patient reports this is improving.  NO shortness of breath reported.    Patient Measurements: Height: 5\' 4"  (162.6 cm) Weight: 170 lb 10.2 oz (77.4 kg) IBW/kg (Calculated) : 54.7  Vital Signs: Temp: 98.8 F (37.1 C) (09/14 0534) Temp Source: Oral (09/14 0534) BP: 140/92 mmHg (09/14 0534) Pulse Rate: 76 (09/14 0534)  Labs:  Recent Labs  02/17/15 0513 02/17/15 1430 02/18/15 0300 02/19/15 0519  HGB  --  11.7* 12.0  --   HCT  --  35.6* 35.9*  --   PLT  --  141* 145*  --   LABPROT 17.3*  --  17.0* 18.8*  INR 1.41  --  1.37 1.57*  CREATININE 0.45  --  0.51 0.59    Estimated Creatinine Clearance: 81 mL/min (by C-G formula based on Cr of 0.59).   Medical History: Past Medical History  Diagnosis Date  . THYROID NODULE, LEFT 04/10/2009  . HYPERLIPIDEMIA 08/21/2007  . GOUT 08/21/2007  . HYPERTENSION 08/21/2007    Dr. Andree Elk, [place] 08/21/2007  . CEREBROVASCULAR ACCIDENT, ACUTE 04/15/2010  . GERD 08/21/2007  . RENAL INSUFFICIENCY 08/21/2007  . LUPUS 08/21/2007  . OSTEOPOROSIS 08/21/2007    Rheumatol at [facility]  . DVT, HX OF 08/21/2007  . CLOSTRIDIUM DIFFICILE COLITIS, HX OF 08/21/2007  . KIDNEY TRANSPLANTATION, HX OF 08/22/2007    s/p renal transplant-Dr. Andree Elk, [facility]  . Pulmonary embolism 07/16/2010  . Renal failure   . Current use of long term anticoagulation     Dr. Andree Elk, [facility]  . Depression     Dr. Andree Elk, [facility]  . History of stroke with residual effects   . Right sided weakness   . CVA 04/17/2010   . Tachycardia   . DIABETES MELLITUS, TYPE II 08/21/2007  . Candida esophagitis 11/12/2014  . Steroid-induced hyperglycemia 11/09/2014    Medications:  PTA Coumadin dose: 5mg  po daily  Assessment: 54 yo F on admitted with sepsis, colitis.  She is on chronic Coumadin for hx PE, +antiphospholipid syndrome.  She has been on Coumadin 5mg  po daily ~ 2 weeks per outpatient [facility] flowsheet.  INR was 5.3 on 9/2.  She was instructed to skip doses x 2 days then resume 5mg  daily at that time.   INR remains elevated on admission INR = 5.89).  Last dose 9/7. CBC ok, no bleeding noted. -vit K 5mg  SQ on 9/9, 10mg  SQ on 9/10)  Today, 02/19/2015: - INR 1.57 - Hg stable; pltc decreased from admit  - No bleeding documented - Major Drug Interactions:none  (Unasyn and flagyl d/c'd) - Diet: soft, thin diet;   Goal of Therapy:  INR 2-3   Plan:  - Coumadin today with 5 mg PO x1 - Daily INR - monitor for s/s bleeding - Continue Lovenox 75mg  SQ q12h until INR is therapeutic   Dolly Rias RPh 02/19/2015, 8:18 AM Pager [phone]

## 2015-02-19 NOTE — Progress Notes (Signed)
Physical Therapy Treatment Patient Details Name: ALLEY DUNKERSON MRN: [id number] DOB: 1960/08/31 Today's Date: 02/19/2015    History of Present Illness Patient is a 54 y/o female admitted with abdominal pain  PMH positive for antiphospholipid syndrome, renal transplant, CVA with R residual deficits, DVT w/ PE, DM, lupus.    PT Comments    Assisted pt OOB to amb a greater distance in hallway. Pt progressing with her mobility and eager to get home.  Follow Up Recommendations  Home health PT     Equipment Recommendations       Recommendations for Other Services       Precautions / Restrictions Precautions Precautions: Fall Restrictions Weight Bearing Restrictions: No    Mobility  Bed Mobility Overal bed mobility: Needs Assistance Bed Mobility: Supine to Sit     Supine to sit: Min assist     General bed mobility comments: increased time with difficulty scooting to EOB  Transfers Overall transfer level: Needs assistance Equipment used: None Transfers: Sit to/from Stand Sit to Stand: Min assist Stand pivot transfers: Mod assist       General transfer comment: Min assist off elevated bed  Ambulation/Gait Ambulation/Gait assistance: Min assist Ambulation Distance (Feet): 45 Feet Assistive device: Rolling walker (2 wheeled)       General Gait Details: pt agreed to amb in hallway.  Eager to D/C back home.  Good steady gait.    Stairs            Wheelchair Mobility    Modified Rankin (Stroke Patients Only)       Balance                                    Cognition Arousal/Alertness: Awake/alert Behavior During Therapy: WFL for tasks assessed/performed Overall Cognitive Status: Within Functional Limits for tasks assessed                      Exercises      General Comments        Pertinent Vitals/Pain Pain Assessment: No/denies pain Pain Score: 4  Pain Location: stomach Pain Descriptors / Indicators:  Sore;Aching Pain Intervention(s): Monitored during session;Patient requesting pain meds-RN notified;Repositioned    Home Living                      Prior Function            PT Goals (current goals can now be found in the care plan section) Progress towards PT goals: Progressing toward goals    Frequency  Min 3X/week    PT Plan      Co-evaluation             End of Session Equipment Utilized During Treatment: Gait belt Activity Tolerance: Patient tolerated treatment well Patient left: in bed;with call bell/phone within reach     Time: 1115-1130 PT Time Calculation (min) (ACUTE ONLY): 15 min  Charges:  $Gait Training: 8-22 mins                    G Codes:      Rica Koyanagi  PTA [facility]  Acute  Rehab Pager      [phone]

## 2015-02-20 DIAGNOSIS — K529 Noninfective gastroenteritis and colitis, unspecified: Secondary | ICD-10-CM

## 2015-02-20 DIAGNOSIS — R1030 Lower abdominal pain, unspecified: Secondary | ICD-10-CM

## 2015-02-20 LAB — GLUCOSE, CAPILLARY
Glucose-Capillary: 96 mg/dL (ref 65–99)
Glucose-Capillary: 76 mg/dL (ref 65–99)
Glucose-Capillary: 105 mg/dL — ABNORMAL HIGH (ref 65–99)
Glucose-Capillary: 127 mg/dL — ABNORMAL HIGH (ref 65–99)
Glucose-Capillary: 186 mg/dL — ABNORMAL HIGH (ref 65–99)
Glucose-Capillary: 197 mg/dL — ABNORMAL HIGH (ref 65–99)

## 2015-02-20 LAB — BASIC METABOLIC PANEL
Anion gap: 6 (ref 5–15)
BUN: <5 mg/dL — ABNORMAL LOW (ref 6–20)
CO2: 27 mmol/L (ref 22–32)
Calcium: 9 mg/dL (ref 8.9–10.3)
Chloride: 105 mmol/L (ref 101–111)
Creatinine, Ser: 0.68 mg/dL (ref 0.44–1.00)
GFR calc Af Amer: >60 mL/min (ref >60)
GFR calc non Af Amer: >60 mL/min (ref >60)
Glucose, Bld: 159 mg/dL — ABNORMAL HIGH (ref 65–99)
Potassium: 3.5 mmol/L (ref 3.5–5.1)
Sodium: 138 mmol/L (ref 135–145)

## 2015-02-20 LAB — PROTIME-INR
INR: 1.84 — ABNORMAL HIGH (ref 0.00–1.49)
Prothrombin Time: 21.2 seconds — ABNORMAL HIGH (ref 11.6–15.2)

## 2015-02-20 MED ORDER — WARFARIN SODIUM 5 MG PO TABS
5.0000 mg | ORAL_TABLET | Freq: Once | ORAL | Status: AC
  Administered 2015-02-20: 5 mg via ORAL
  Filled 2015-02-20: qty 1

## 2015-02-20 MED ORDER — ONDANSETRON HCL 4 MG/2ML IJ SOLN
2.0000 mg | Freq: Once | INTRAMUSCULAR | Status: AC
  Administered 2015-02-20: 2 mg via INTRAVENOUS
  Filled 2015-02-20: qty 2

## 2015-02-20 NOTE — Progress Notes (Signed)
[facility] PROGRESS NOTE  Connie Ruiz [id number] DOB: Oct 26, 1960 DOA: 02/13/2015 PCP: Renato Shin, MD   Brief narrative 54 year old female with history of hypertension, diabetes mellitus, and  anti-phospholipid syndrome on Coumadin, status post renal transplant on immunosuppressants, history of CVA with right added weakness and expressive aphasia, history of being 2012, jejunitis, GERD, depression who presented to the ED with 2 day history of nausea, vomiting,  several watery diarrhea and lower abdominal pain. In the ED patient found to be septic with tachycardia, WBC 15.7. Mild hypokalemia. She had supratherapeutic INR of 5.89. UA positive for UTI. Remaining blood work pending. CT of the abdomen and pelvis showed inflammatory changes in the colon and jejunum suggestive of enteritis and colitis. Also showed gas wall thickening in the bladder suggestive of cystitis. Patient admitted to telemetry on empiric antibiotics.  Assessment/Plan: Sepsis, Secondary to enteritis/colitis and UTI. -Resolved. Afebrile. Lactic acid normal.  -Preliminary blood cultures negative. Urine cultures: no growth.  - Received Empiric Unasyn and Flagyl, for 5 days.  -C. difficile antigen positive toxin negative. Will follow GI pathogen./  - GI pathogen panel negative.  -Abdominal pain better 5/10. Diarrhea improved.  -discussed with ID will treat for presume C diff. Will change flagyl to vancomycin.  -needs 14 days treatment.  Day 3 vancomycin. I Patient with worsening nausea. Abdominal pain the same . Will ask GI to evaluate.  Prior records from [facility] review, biopsy from colonoscopy unrevealing.   Anti-phospholipid syndrome/history of PE on chronic anticoagulation with warfarin INR supratherapeutic on admission. No signs of bleeding. Warfarin held and received vitamin K.  Coumadin per pharmacy.  Bridging with lovenox. INR at 1.8  Irregular right lung  lobe nodular density Seen on chest x-ray with a  4 cm left upper lobe and 5 mm inferior right lower lobe. CT of the chest with contrast was negative. Showed a incidental 2.5 cm solid nodule arising from the thyroid isthmus and 1.8 cm irregular density in the left thyroid lobe.  - TSH 2.1, free T3 2.3 and T 4 1.33 just mildly elevated.  - Thyroid ultrasound with nodules. She will need Biopsy outpatient. Daughter aware  Hypokalemia/hypomagnesemia  k normal.  Replete mg.   Essential hypertension Continue Cardizem. Hold losartan for now.  Chronic diastolic CHF 2-D echo from 06/2013 with EF of 60-65% and grade 1 diastolic dysfunction.   GERD Continue Pepcid.  History of anti-phospholipid syndrome On chronic anticoagulation. Continue with coumadin.   Diabetes mellitus type 2 Monitor on sliding scale insulin.  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 On Coumadin. Ordered PT evaluation.  History of renal transplant Continue CellCept, prednisone and Prograf. Prograf level. Renal function stable. Follows with nephrologist at [facility] Renal function stable.   Poor IV access Appreciate PCCM consulted for central line placement. Has left IJ.  Diet: bland diet.   DVT prophylaxis: Supratherapeutic INR.   Code Status: Full code Family Communication: None at bedside Disposition Plan: home when diarrhea abdominal pain improved.    Consultants:  None  Procedures:  CT abdomen and pelvis  Antibiotics:  IV Unasyn and Flagyl since 9/9---9-13  HPI/Subjective: Not feeling well today. Relates nausea, vomiting this am.  Abdominal pain the same   Objective: Filed Vitals:   02/20/15 1617  BP: 126/75  Pulse: 94  Temp: 99.1 F (37.3 C)  Resp:     Intake/Output Summary (Last 24 hours) at 02/20/15 1707 Last data filed at 02/20/15 0913  Gross per 24 hour  Intake  240 ml  Output      0 ml  Net    240 ml   Filed Weights   02/14/15 1733  Weight: 77.4 kg (170 lb 10.2 oz)     Exam:   General: Elderly female lying in bed in no acute distress  HEENT:  moist oral mucosa, supple neck  Chest: Clear to auscultation bilaterally  CVS: S1 and S2 normal, no murmurs rub or gallop  GI: Soft, nondistended, laparotomy scar+, bowel sounds present, tender to pressure over infraumbilical area  Musculoskeletal: Warm, no edema  CNS: Alert and oriented, expressive aphasia, right-sided weakness (chronic)  Data Reviewed: Basic Metabolic Panel:  Recent Labs Lab 02/14/15 1955  02/16/15 0515 02/17/15 0513 02/18/15 0300 02/19/15 0519 02/20/15 1335  NA 140  < > 138 140 140 143 138  K 3.3*  < > 2.9* 3.7 3.5 3.3* 3.5  CL 113*  < > 107 106 107 109 105  CO2 19*  < > 24 25 26 28 27   GLUCOSE 99  < > 77 78 69 84 159*  BUN 11  < > <5* <5* <5* <5* <5*  CREATININE 0.70  < > 0.56 0.45 0.51 0.59 0.68  CALCIUM 8.2*  < > 8.1* 8.4* 8.2* 8.8* 9.0  MG 1.0*  --  1.2* 1.5* 1.7  --   --   < > = values in this interval not displayed. Liver Function Tests:  Recent Labs Lab 02/13/15 2154  AST 28  ALT 19  ALKPHOS 67  BILITOT 0.6  PROT 7.3  ALBUMIN 3.8    Recent Labs Lab 02/13/15 2154  LIPASE 21*   No results for input(s): AMMONIA in the last 168 hours. CBC:  Recent Labs Lab 02/13/15 2154 02/14/15 1955 02/15/15 0520 02/17/15 1430 02/18/15 0300  WBC 15.7* 10.1 12.9* 8.8 9.5  NEUTROABS 9.6*  --   --   --   --   HGB 12.7 12.2 12.1 11.7* 12.0  HCT 38.4 38.3 37.6 35.6* 35.9*  MCV 91.2 92.3 91.5 88.6 90.0  PLT 290 211 210 141* 145*   Cardiac Enzymes: No results for input(s): CKTOTAL, CKMB, CKMBINDEX, TROPONINI in the last 168 hours. BNP (last 3 results)  Recent Labs  02/14/15 1955  BNP 53.0    ProBNP (last 3 results) No results for input(s): PROBNP in the last 8760 hours.  CBG:  Recent Labs Lab 02/20/15 0047 02/20/15 0522 02/20/15 0818 02/20/15 1248 02/20/15 1646  GLUCAP 96 76 105* 127* 186*    Recent Results (from the past 240 hour(s))   Culture, blood (x 2)     Status: None   Collection Time: 02/14/15 11:10 AM  Result Value Ref Range Status   Specimen Description BLOOD LEFT FINGER  Final   Special Requests IN PEDIATRIC BOTTLE 3ML  Final   Culture   Final    NO GROWTH 5 DAYS Performed at [facility]    Report Status 02/19/2015 FINAL  Final  C difficile quick screen w PCR reflex     Status: Abnormal   Collection Time: 02/14/15 12:28 PM  Result Value Ref Range Status   C Diff antigen POSITIVE (A) NEGATIVE Final   C Diff toxin NEGATIVE NEGATIVE Final   C Diff interpretation   Final    C. difficile present, but toxin not detected. This indicates colonization. In most cases, this does not require treatment. If patient has signs and symptoms consistent with colitis, consider treatment.  Urine culture     Status:  None   Collection Time: 02/14/15  6:21 PM  Result Value Ref Range Status   Specimen Description URINE, CLEAN CATCH  Final   Special Requests NONE  Final   Culture   Final    NO GROWTH 2 DAYS Performed at [facility]    Report Status 02/16/2015 FINAL  Final  Culture, blood (x 2)     Status: None   Collection Time: 02/14/15  7:55 PM  Result Value Ref Range Status   Specimen Description BLOOD RIGHT ARM  Final   Special Requests BOTTLES DRAWN AEROBIC AND ANAEROBIC 5CC  Final   Culture   Final    NO GROWTH 5 DAYS Performed at [facility]    Report Status 02/19/2015 FINAL  Final  Stool culture     Status: None   Collection Time: 02/15/15  8:51 AM  Result Value Ref Range Status   Specimen Description STOOL  Final   Special Requests Immunocompromised  Final   Culture   Final    NO SALMONELLA, SHIGELLA, CAMPYLOBACTER, YERSINIA, OR E.COLI 0157:H7 ISOLATED Performed at [facility]    Report Status 02/19/2015 FINAL  Final     Studies: No results found.  Scheduled Meds: . calcitRIOL  0.25 mcg Oral Daily  . chlorhexidine  15 mL Mouth/Throat BID  . cholecalciferol  1,000  Units Oral Daily  . diltiazem  240 mg Oral Daily  . enoxaparin (LOVENOX) injection  75 mg Subcutaneous Q12H  . famotidine  20 mg Oral BID  . folic acid  1 mg Oral Daily  . insulin aspart  0-9 Units Subcutaneous 6 times per day  . multivitamin  1 tablet Oral QHS  . mycophenolate  250 mg Oral BID  . oxybutynin  5 mg Oral TID  . predniSONE  5 mg Oral Q breakfast  . sertraline  100 mg Oral Daily  . sodium chloride  3 mL Intravenous Q12H  . tacrolimus  1 mg Oral BID  . vancomycin  125 mg Oral 4 times per day  . warfarin  5 mg Oral ONCE-1800  . Warfarin - Pharmacist Dosing Inpatient   Does not apply q1800   Continuous Infusions: . sodium chloride 50 mL/hr at 02/18/15 2223      Time spent: 25 minutes    Jusitn Salsgiver A  [facility] Pager [phone] If 7PM-7AM, please contact night-coverage at [email or web address], password [facility] 02/20/2015, 5:07 PM  LOS: 6 days

## 2015-02-20 NOTE — Clinical Social Work Note (Signed)
Clinical Social Work Assessment  Patient Details  Name: Connie Ruiz MRN: [id number] Date of Birth: 1960-07-06  Date of referral:  02/20/15               Reason for consult:  Discharge Planning                Permission sought to share information with:  Other Permission granted to share information::  No  Name::        Agency::     Relationship::     Contact Information:     Housing/Transportation Living arrangements for the past 2 months:  Single Family Home Source of Information:  Patient Patient Interpreter Needed:  None Criminal Activity/Legal Involvement Pertinent to Current Situation/Hospitalization:  No - Comment as needed Significant Relationships:  Adult Children Lives with:  Self Do you feel safe going back to the place where you live?  Yes Need for family participation in patient care:  No (Coment)  Care giving concerns:  PT recommends HH. Patient feels comfortable returning home.   Social Worker assessment / plan:  CSW received referral to call dtr re: DC plans. Per RN, dtr wants patient placed in SNF. Per chart review, PT recommends [facility] at [facility]. CSW went to room to meet with patient. Patient reports she lives at home but is able to manage all of her needs and wants to DC back home. Patient reports she does not know why dtr is involved but does not want dtr to have her information and does not want dtr to make decisions on her behalf. Patient reports CSW can call dtr but only to tell her to call patient to get information and does not want CSW to share patient's information.  CSW tried calling dtr but phone went to voicemail and unable to leave message. CSW text paged MD patient's decision to return home.  CSW is signing off but available if needed.  Employment status:  Disabled (Comment on whether or not currently receiving Disability) Insurance information:  Programmer, applications, Medicaid In [place] PT Recommendations:  Home with [facility] / Referral to  community resources:  [place]  Patient/Family's Response to care:  Patient engaged during assessment but reports she is frustrated that dtr is trying to make decisions for her.  Patient/Family's Understanding of and Emotional Response to Diagnosis, Current Treatment, and Prognosis:  Patient reports she is young and independent and can care for herself. Patient feels that illness is acute and she will feel better shortly.  Emotional Assessment Appearance:  Appears stated age Attitude/Demeanor/Rapport:  Other (Cooperative) Affect (typically observed):  Accepting Orientation:  Oriented to Self, Oriented to Place, Oriented to  Time, Oriented to Situation Alcohol / Substance use:  Not Applicable Psych involvement (Current and /or in the community):  No (Comment)  Discharge Needs  Concerns to be addressed:  No discharge needs identified Readmission within the last 30 days:  No Current discharge risk:  None Barriers to Discharge:  No Barriers Identified   Boone Master, [place] 02/20/2015, 12:16 PM [phone]

## 2015-02-20 NOTE — Progress Notes (Signed)
Physical Therapy Treatment Patient Details Name: Connie Ruiz MRN: [id number] DOB: [birth year] Today's Date: 02/20/2015    History of Present Illness Patient is a 54 y/o female admitted with abdominal pain  PMH positive for antiphospholipid syndrome, renal transplant, CVA with R residual deficits, DVT w/ PE, DM, lupus.    PT Comments    Pt progressing well with her mobilty and strength with increased ability to self perform bed mobility, transfers and amb.  Pt is eager to go home.   Follow Up Recommendations  Home health PT     Equipment Recommendations  None recommended by PT    Recommendations for Other Services       Precautions / Restrictions Precautions Precautions: Fall Restrictions Weight Bearing Restrictions: No    Mobility  Bed Mobility Overal bed mobility: Modified Independent             General bed mobility comments: pt able to get self OOB and back into bed.  Much improved.  Transfers Overall transfer level: Needs assistance Equipment used: None Transfers: Sit to/from Stand Sit to Stand: Supervision         General transfer comment: pt more able to self rise and perform task with decreased assist level.    Ambulation/Gait Ambulation/Gait assistance: Supervision;Min guard Ambulation Distance (Feet): 75 Feet Assistive device: Rolling walker (2 wheeled) Gait Pattern/deviations: Step-through pattern;Decreased stride length Gait velocity: WFL   General Gait Details: increased amb distance/tolerance demonstrating a good safe gait.     Stairs            Wheelchair Mobility    Modified Rankin (Stroke Patients Only)       Balance                                    Cognition Arousal/Alertness: Awake/alert Behavior During Therapy: WFL for tasks assessed/performed Overall Cognitive Status: Within Functional Limits for tasks assessed                      Exercises      General Comments        Pertinent  Vitals/Pain Pain Assessment: Faces Faces Pain Scale: Hurts a little bit Pain Location: stomach Pain Descriptors / Indicators: Cramping Pain Intervention(s): Monitored during session;Repositioned    Home Living                      Prior Function            PT Goals (current goals can now be found in the care plan section) Progress towards PT goals: Progressing toward goals    Frequency  Min 3X/week    PT Plan      Co-evaluation             End of Session Equipment Utilized During Treatment: Gait belt Activity Tolerance: Patient tolerated treatment well Patient left: in bed;with call bell/phone within reach     Time: 1430-1445 PT Time Calculation (min) (ACUTE ONLY): 15 min  Charges:  $Gait Training: 8-22 mins                    G Codes:      Rica Koyanagi  PTA [facility]  Acute  Rehab Pager      [phone]

## 2015-02-20 NOTE — Progress Notes (Signed)
[place] for Coumadin Indication: hx PE, +antiphospholipid syndrome  Allergies  Allergen Reactions  . Oxycodone-Acetaminophen Shortness Of Breath and Nausea Only  . Propoxyphene N-Acetaminophen Shortness Of Breath and Nausea Only  . Sulfonamide Derivatives Shortness Of Breath and Nausea Only  . Codeine Nausea Only  . Latex Rash  . Metoprolol Rash  . Morphine And Related Rash    IV site on arm is red, patient reports this is improving.  NO shortness of breath reported.    Patient Measurements: Height: 5\' 4"  (162.6 cm) Weight: 170 lb 10.2 oz (77.4 kg) IBW/kg (Calculated) : 54.7  Vital Signs: Temp: 98.7 F (37.1 C) (09/15 0551) Temp Source: Oral (09/15 0551) BP: 132/79 mmHg (09/15 0551) Pulse Rate: 98 (09/15 0551)  Labs:  Recent Labs  02/17/15 1430 02/18/15 0300 02/19/15 0519 02/20/15 0520  HGB 11.7* 12.0  --   --   HCT 35.6* 35.9*  --   --   PLT 141* 145*  --   --   LABPROT  --  17.0* 18.8* 21.2*  INR  --  1.37 1.57* 1.84*  CREATININE  --  0.51 0.59  --     Estimated Creatinine Clearance: 81 mL/min (by C-G formula based on Cr of 0.59).   Medical History: Past Medical History  Diagnosis Date  . THYROID NODULE, LEFT 04/10/2009  . HYPERLIPIDEMIA 08/21/2007  . GOUT 08/21/2007  . HYPERTENSION 08/21/2007    Dr. Andree Elk, [place] 08/21/2007  . CEREBROVASCULAR ACCIDENT, ACUTE 04/15/2010  . GERD 08/21/2007  . RENAL INSUFFICIENCY 08/21/2007  . LUPUS 08/21/2007  . OSTEOPOROSIS 08/21/2007    Rheumatol at [facility]  . DVT, HX OF 08/21/2007  . CLOSTRIDIUM DIFFICILE COLITIS, HX OF 08/21/2007  . KIDNEY TRANSPLANTATION, HX OF 08/22/2007    s/p renal transplant-Dr. Andree Elk, [facility]  . Pulmonary embolism 07/16/2010  . Renal failure   . Current use of long term anticoagulation     Dr. Andree Elk, [facility]  . Depression     Dr. Andree Elk, [facility]  . History of stroke with residual effects   . Right sided weakness   . CVA 04/17/2010   . Tachycardia   . DIABETES MELLITUS, TYPE II 08/21/2007  . Candida esophagitis 11/12/2014  . Steroid-induced hyperglycemia 11/09/2014    Medications:  PTA Coumadin dose: 5mg  po daily  Assessment: 54 yo F on admitted with sepsis, colitis.  She is on chronic Coumadin for hx PE, +antiphospholipid syndrome.  She has been on Coumadin 5mg  po daily ~ 2 weeks per outpatient [facility] flowsheet.  INR was 5.3 on 9/2.  She was instructed to skip doses x 2 days then resume 5mg  daily at that time.   INR remains elevated on admission INR = 5.89).  Last dose 9/7. CBC ok, no bleeding noted. -vit K 5mg  SQ on 9/9, 10mg  SQ on 9/10)  Today, 02/20/2015: - INR 1.84 - Hg stable; pltc decreased from admit  - No bleeding documented - Major Drug Interactions:none  - Diet: soft, thin diet;   Goal of Therapy:  INR 2-3   Plan:  - Coumadin today with 5 mg PO x1 - Daily INR - monitor for s/s bleeding - Continue Lovenox 75mg  SQ q12h until INR is therapeutic   Dolly Rias RPh 02/20/2015, 9:24 AM Pager [phone]

## 2015-02-20 NOTE — Consult Note (Signed)
Consultation  Referring Provider:  [facility]    Primary Care Physician:  Renato Shin, MD Primary Gastroenterologist:    Lucio Edward, MD     Reason for Consultation:  Abdominal pain, abnormal CTscan            HPI:   Connie Ruiz is a 54 y.o. female with a complex medical history.  Patient was seen by [place] in 2012 for colorectal cancer screening. Due to significant disability / multiple medical problems patient opted to proceed with a barium enema instead. Exam was limited but no gross lesions seen.   Between May - August patient has had several admissions for abdominal pain. I have reviewed [facility] records in [place] which reference findings from [place] hospitalization in May when patient diagnosed with jejunitis (scan). 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 She was treated with antibiotics and high dose steroids with improvement in pain. Repeat CTscan 11/07/14 revealed improvement in jejunitis.   Patient readmitted with in late June, that time to [facility]. CTscan revealed significant improvement in jejunal inflammation. She didn't improve improve on steroids. Enteroscopy done with findings of only candida esophagitis.  Jejunal biopsies unremarkable. Gastric biopsies c/w reactive gastropathy.  We recommended transfer to [facility]. Patient admitted to [facility] 11/15/14 for 5 days. Repeat CTscan 6/14 was negative. Pain improved with steroids  She was discharged to SNF with taper dose of prednisone.   Patient readmitted to [facility] early August with recurrent LLQ abdominal pain, and diarrhea and a UTI. C. Difficile and GI pathogen panel were negative. CMV negative. GI evaluated. Colonoscopy done with findings of 3 polyps. A 1.5cm multi-lobulated sessile cecal polyp, a 54m ascending polyp and a 477mdescending polyp were all left in place. Multiple biopsies obtained. It was recommended that patient have repeat colonoscopy off coumadin in 3-6 months   Patient presented to  this ED 02/13/15 with weakness and abdominal pain. Repeat CTscan with contrast suggesting inflammatory change in colon and jejunum. Patient states [facility]. Pain is constant, worse with meals. No significant diarrhea, stools alternate between formed and liquid. No overt GI bleeding.    Past Medical History  Diagnosis Date  . THYROID NODULE, LEFT 04/10/2009  . HYPERLIPIDEMIA 08/21/2007  . GOUT 08/21/2007  . HYPERTENSION 08/21/2007    [facility]  . CEREBROVASCULAR ACCIDENT, ACUTE 04/15/2010  . GERD 08/21/2007  . RENAL INSUFFICIENCY 08/21/2007  . LUPUS 08/21/2007  . OSTEOPOROSIS 08/21/2007    [facility]umat[facility] at [facility]  . DVT, HX OF 08/21/2007  . CLOSTRIDIUM DIFFICILE COLITIS, HX OF 08/21/2007  . KIDNEY TRANSPLANTATION, HX OF 08/22/2007    [facility]. Pulmonary embolism 07/16/2010  . Renal failure   . Current use of long term anticoagulation     [facility]. [facility] History of stroke with residual effects   . Right sided weakness   . CVA 04/17/2010  . Tachycardia   . DIABETES MELLITUS, TYPE II 08/21/2007  . Candida esophagitis 11/12/2014  . Steroid-induced hyperglycemia 11/09/2014    Past Surgical History  Procedure Laterality Date  . Cholecystectomy    . Tubal ligation    . Kidney transplant Right 2009  . Renal biopsy, open  1981  . Enteroscopy N/A 11/11/2014    Procedure: ENTEROSCOPY;  Surgeon: MaLadene ArtistMD;  Location: [facility];  Service: Endoscopy;  Laterality: N/A;    Family History  Problem Relation Age of Onset  . Heart attack Mother   . Heart disease Father   . Asthma Sister   . Asthma Sister   . Asthma Daughter   . Cancer Maternal Grandfather     prostate  . Cancer Paternal Grandfather     colon     Social History  Substance Use Topics  . Smoking status: Never Smoker   . Smokeless tobacco:  Never Used  . Alcohol Use: No    Prior to Admission medications   Medication Sig Start Date End Date Taking? Authorizing Provider  alendronate (FOSAMAX) 70 MG tablet Take 70 mg by mouth once a week. Take with a full glass of water on an empty stomach.   Yes Historical Provider, MD  calcitRIOL (ROCALTROL) 0.25 MCG capsule TAKE ONE CAPSULE BY MOUTH DAILY 02/11/15  Yes Renato Shin, MD  diltiazem (TIAZAC) 240 MG 24 hr capsule Take 1 capsule (240 mg total) by mouth daily. 10/04/14  Yes Renato Shin, MD  esomeprazole (NEXIUM) 20 MG capsule TAKE ONE CAPSULE BY MOUTH EVERY MORNING BEFORE BREAKFAST 02/13/14  Yes Renato Shin, MD  losartan (COZAAR) 100 MG tablet Take 1 tablet (100 mg total) by mouth daily. 02/06/15  Yes Renato Shin, MD  mycophenolate (CELLCEPT) 250 MG capsule Take 250 mg by mouth 2 (two) times daily.   Yes Historical Provider, MD  oxybutynin (DITROPAN) 5 MG tablet Take 5 mg by mouth 3 (three) times daily.   Yes Historical Provider, MD  predniSONE (DELTASONE) 5 MG tablet Take 5 mg by mouth daily with breakfast.   Yes Historical Provider, MD  sertraline (ZOLOFT) 100 MG tablet Take 1 tablet (100 mg total) by mouth daily. 10/04/14  Yes Renato Shin, MD  tacrolimus (PROGRAF) 1 MG capsule Take 1 mg by mouth 2 (two) times daily.  04/16/14  Yes Historical Provider, MD  warfarin (COUMADIN) 5 MG tablet Take 1 to 2 tablets by mouth daily as directed by coumadin clinic Patient taking differently: Take 5 mg by mouth daily.  01/27/15  Yes Lorretta Harp, MD  ammonium lactate (AMLACTIN) 12 % cream Apply topically as needed for dry skin.    Historical Provider, MD  Blood Glucose Monitoring Suppl (ONETOUCH VERIO IQ SYSTEM) W/DEVICE KIT Use to check blood sugar 1 time per day 02/12/15   Renato Shin, MD  glucose blood (ONE TOUCH ULTRA TEST) test strip Use to check blood sugar 1 time per day 02/12/15   Renato Shin, MD  nystatin (MYCOSTATIN) powder Apply 1 g topically 4 (four) times daily as needed. For yeast under  breast    Historical Provider, MD  ondansetron (ZOFRAN ODT) 4 MG disintegrating tablet 82m ODT q4 hours prn nausea/vomiting Patient taking differently: Take 4 mg by mouth every 8 (eight) hours as needed for nausea.  06/28/14   SSherwood Gambler MD  [facility] LANCETS [id number] Use to check blood sugar 1 time per day 02/12/15   SRenato Shin MD  triamcinolone (KENALOG) 0.025 % cream Apply 1 application topically daily as needed (dry skin).     Historical Provider, MD    Current Facility-Administered Medications  Medication Dose Route Frequency Provider Last Rate Last Dose  . 0.9 %  sodium chloride infusion   Intravenous Continuous Belkys A Regalado, MD 50 mL/hr at 02/18/15 2223    . ammonium lactate (LAC-HYDRIN) 12 % lotion   Topical PRN XIvor Costa MD  1 application at 35/45/62 1806  . calcitRIOL (ROCALTROL) capsule 0.25 mcg  0.25 mcg Oral Daily Ivor Costa, MD   0.25 mcg at 02/20/15 1130  . chlorhexidine (PERIDEX) 0.12 % solution 15 mL  15 mL Mouth/Throat BID Nishant Dhungel, MD   15 mL at 02/19/15 2151  . cholecalciferol (VITAMIN D) tablet 1,000 Units  1,000 Units Oral Daily Ivor Costa, MD   1,000 Units at 02/20/15 1130  . diltiazem (TIAZAC) 24 hr capsule 240 mg  240 mg Oral Daily Ivor Costa, MD   240 mg at 02/20/15 1130  . enoxaparin (LOVENOX) injection 75 mg  75 mg Subcutaneous Q12H Anh P Pham, RPH   75 mg at 02/20/15 1129  . famotidine (PEPCID) tablet 20 mg  20 mg Oral BID Nishant Dhungel, MD   20 mg at 02/20/15 1131  . folic acid (FOLVITE) tablet 1 mg  1 mg Oral Daily Ivor Costa, MD   1 mg at 02/20/15 1131  . insulin aspart (novoLOG) injection 0-9 Units  0-9 Units Subcutaneous 6 times per day Louellen Molder, MD   1 Units at 02/20/15 1324  . morphine 2 MG/ML injection 2 mg  2 mg Intravenous Q4H PRN Ivor Costa, MD   2 mg at 02/20/15 0908  . multivitamin (RENA-VIT) tablet 1 tablet  1 tablet Oral QHS Ivor Costa, MD   1 tablet at 02/19/15 2151  . mycophenolate (CELLCEPT) capsule 250 mg  250 mg Oral  BID Ivor Costa, MD   250 mg at 02/20/15 1130  . nystatin (MYCOSTATIN/NYSTOP) topical powder 1 g  1 g Topical QID PRN Ivor Costa, MD      . ondansetron [facility]) injection 4 mg  4 mg Intravenous Q8H PRN Ivor Costa, MD   4 mg at 02/20/15 0908  . oxybutynin (DITROPAN) tablet 5 mg  5 mg Oral TID Ivor Costa, MD   5 mg at 02/20/15 1130  . predniSONE (DELTASONE) tablet 5 mg  5 mg Oral Q breakfast Ivor Costa, MD   5 mg at 02/20/15 0847  . sertraline (ZOLOFT) tablet 100 mg  100 mg Oral Daily Ivor Costa, MD   100 mg at 02/20/15 1130  . sodium chloride 0.9 % bolus 1,000 mL  1,000 mL Intravenous PRN Nishant Dhungel, MD      . sodium chloride 0.9 % injection 10-40 mL  10-40 mL Intracatheter PRN Nishant Dhungel, MD   10 mL at 02/20/15 1337  . sodium chloride 0.9 % injection 3 mL  3 mL Intravenous Q12H Ivor Costa, MD   3 mL at 02/19/15 2153  . tacrolimus (PROGRAF) capsule 1 mg  1 mg Oral BID Ivor Costa, MD   1 mg at 02/20/15 1130  . triamcinolone (KENALOG) 5.638 % cream 1 application  1 application Topical Daily PRN Ivor Costa, MD      . vancomycin (VANCOCIN) 50 mg/mL oral solution 125 mg  125 mg Oral 4 times per day Elmarie Shiley, MD   125 mg at 02/20/15 1129  . warfarin (COUMADIN) tablet 5 mg  5 mg Oral ONCE-1800 Angela Adam, [facility]      . Warfarin - Pharmacist Dosing Inpatient   Does not apply [place], [facility]   Stopped at 02/15/15 1800    Allergies as of 02/13/2015 - Review Complete 02/13/2015  Allergen Reaction Noted  . Oxycodone-acetaminophen Shortness Of Breath and Nausea Only   . Propoxyphene n-acetaminophen Shortness Of Breath and Nausea Only   . Sulfonamide derivatives Shortness Of Breath  and Nausea Only   . Codeine Nausea Only 10/21/2009  . Latex Rash 06/17/2011  . Metoprolol Rash 08/31/2013     Review of Systems:    As per HPI, otherwise negative    Physical Exam:  Vital signs in last 24 hours: Temp:  [98.5 F (36.9 C)-98.8 F (37.1 C)] 98.7 F (37.1 C) (09/15 0551) Pulse  Rate:  [80-98] 98 (09/15 0551) Resp:  [18-20] 18 (09/15 0551) BP: (132-148)/(79-85) 132/79 mmHg (09/15 0551) SpO2:  [91 %-99 %] 99 % (09/15 0551) Last BM Date: 02/18/15 General:   Pleasant black female in NAD Head:  Normocephalic and atraumatic. Eyes:   No icterus.   Conjunctiva pink. Ears:  Normal auditory acuity. Neck:  Supple Lungs:  Respirations even and unlabored. Lungs clear to auscultation bilaterally.   No wheezes, crackles, or rhonchi.  Heart:  Regular rate and rhythm Abdomen:  Soft, nondistended, moderate LLQ tenderness. Normal bowel sounds. No appreciable masses or hepatomegaly.  Rectal:  Not performed.  Msk:  Symmetrical without gross deformities.  Extremities:  Without edema. Neurologic:  Alert and  oriented x4;  grossly normal neurologically. Skin:  Intact without significant lesions or rashes. Psych:  Alert and cooperative. Normal affect.  LAB RESULTS:  Recent Labs  02/17/15 1430 02/18/15 0300  WBC 8.8 9.5  HGB 11.7* 12.0  HCT 35.6* 35.9*  PLT 141* 145*   BMET  Recent Labs  02/18/15 0300 02/19/15 0519  NA 140 143  K 3.5 3.3*  CL 107 109  CO2 26 28  GLUCOSE 69 84  BUN <5* <5*  CREATININE 0.51 0.59  CALCIUM 8.2* 8.8*   PT/INR  Recent Labs  02/19/15 0519 02/20/15 0520  LABPROT 18.8* 21.2*  INR 1.57* 1.84*    STUDIES at [facility])  CT ABDOMEN PELVIS W CONTRAST (ENTEROGRAPHY) 11/19/2014  [facility]   1.  No evidence of acute bowel inflammation. 2.  Bilateral lesions within the native kidneys that have internal areas of high density. These may represent complex cyst or possibly solid lesions. Recommend renal ultrasound to further evaluate. 3.  Extrahepatic biliary ductal dilation. While this may be normal in this patient status post cholecystectomy, recommend correlation with LFTs. 4.  Small amount of free intraperitoneal fluid which is abnormal if the patient is postmenopausal.   CT OF THE ABDOMEN AND  PELVIS WITH INTRAVENOUS CONTRAST (CT ENTEROGRAPHY), 11/19/2014 4:06 PM   INDICATION: ^W09.89 Jejunitis   COMPARISON: CT 08/27/2004   TECHNIQUE: After the administration of intravenous contrast, axial images of the abdomen and pelvis were obtained in the portal venous phase. Supplemental 2D reformatted images were generated and reviewed as needed.  [facility] and its affiliates are committed to minimizing radiation dose to patients while maintaining necessary diagnostic image quality. All CT scans are therefore performed using "As Low As Reasonably Achievable (ALARA)" protocols with either manual or automated exposure controls calibrated to the age and size of each patient.     FINDINGS:  LOWER CHEST Mediastinum/hila: Within normal limits. Heart/vessels: Within normal limits. Pleura: Within normal limits. Lungs: Within normal limits.  ABDOMEN Liver: Within normal limits. Gallbladder/biliary: Status post cholecystectomy.  Extrahepatic biliary ductal dilation  Spleen: Punctate foci of calcification consistent with prior granulomatous disease. Pancreas: Within normal limits. Adrenals: Within normal limits. Kidneys: The native kidneys are atrophic. The right iliac fossa transplant kidney measures 9.2 cm. No stones or hydronephrosis involving this kidney.. Within the left date of kidney there is a 2.0 x  2.0 cm lesion within the upper pole which has internal areas of high density. There are multiple additional subcentimeter lesion within the left than the right kidney there is an approximately 1.5 x 1.4 cm cyst within the lower pole which has internal areas of high density. There are multiple additional subcentimeter cysts within the right kidney. Kidney. Peritoneum/mesenteries: Within normal limits. Extraperitoneum: Within normal limits. Gastrointestinal tract: Scattered diverticula without evidence of diverticulitis. Vascular: Atherosclerotic ulcerations of the aorta  and its projections.  PELVIS Peritoneum: Within normal limits. Extraperitoneum:  There is a small amount of free intraperitoneal fluid. Ureters: Within normal limits. Bladder: Within normal limits. Reproductive system: Within normal limits. Vascular: Atherosclerotic ulcerations of the aorta and its projections.  MSK: Multiple subcutaneous calcified granulomas are seen scattered throughout the soft tissues. Postsurgical changes in the subcutaneous tissues of the right abdomen from prior transplant surgery. Resolution of previously seen fluid collection.     Colonoscopy Path reports 01/10/15 ACCESSION NUMBER: [id number] RECEIVED: 01/13/2015 ORDERING PHYSICIAN: Langley Gauss , MD PATIENT NAME: Townsend, Elisa [facility] SURGICAL PATHOLOGY REPORT  FINAL PATHOLOGIC DIAGNOSIS LARGE INTESTINE, LEFT, BIOPSY: Focal architectural distortion with crypt drop out and laminal proprial fibrosis (see comment).  COMMENT: The changes are non-specific and likely represent reparative changes from mucosal injury from some previous period of time. Other changes indicative of chronic colitis are not identified. I have personally reviewed the slides and/or other related materials referenced, and have edited the report as part of my pathologic assessment and final interpretation.  Electronically Signed Out By: Areta Haber , MD 01/14/2015 12:58:08  Specimen(s) Received Left colon biopsies    Impression / Plan:   1. Medically complicated 54 year old female with several recent admissions between [place],  [place] and [place] for abdominal pain. Diagnosed at [facility] in May with jejunitis. Initial response and radiologic improvement with antibiotics and steroids. Recurrent periumbilical abdominal pain leading to several admissions. Follow up CTscan in June negative for bowel inflammation. She developed diarrhea several weeks ago. Stool studies, EGD / colonoscopy with biopsies negative. Now readmitted  with abdominal pain and CTscan suggesting possible jejunal and colon inflammation.   Etiology of ongoing / recurrent pain not clear. Would deep / balloon enteroscopy be of value?     2. Diarrhea in chronic immunosuppressed patient. C-diff antigen is positive, toxin negative. This seems like an indeterminate result. Denies significant diarrhea currently but did on admission. Stools alternate between formed and liquids currently. She is being treated with oral Vancomycin now. Doubt LLQ pain result of "colitis" especially since it is the same pain she has had all along.   3. History of renal transplant, on longterm immunosuppressants.   4. 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   5. Hx of PE, on chronic coumadin  6. DM / HTN / lupus / antiphopholiped syndrome.   7. Thyroid nodule on CT of Chest.   Thanks   LOS: 6 days   Tye Savoy  02/20/2015, 1:51 PM    Attending Addendum: I have taken an interval history, reviewed the chart, and examined the patient. I agree with the Advanced Practitioner's note and impression. I spoke with the patient this evening, her pain seems mostly periumbilical to me. She reports she feels over this admission her abdominal pain has improved and her diarrhea has significantly improved.  Agree with full course of treatment for Cdiff, as isolated R sided C Diff (to correlate with CT findings) is possible to have caused some of  this presentation. Unclear if the R sided colitis correlates to the jejunitis of if two separate processes, as the latter has been noted on prior imaging with her prior admissions. Unclear etiology for her jejunitis. Differential includes vasculitis/SLE enteritis, IBD, infectious, malignancy (lymphoma - would be rare), etc. She does appear to respond symptomatically to steroids previously. However, she's been on tacrolimus and other immunosuppressants, and has normal inflammatory markers. The best way to clarify the jejunitis would be  with tissue diagnosis, however the only way to obtain this would be with balloon enteroscopy or surgical resection, which she is not a great candidate for given her comorbidities. May consider balloon enteroscopy as an outpatient with tertiary care center as this is not available here. For now she seems to be clinically improving and would continue C diff therapy for full course. If she has a relapse or feels worse could consider repeating colonoscopy. Please call with questions / concerns.   Hawthorne Cellar, MD [facility] Gastroenterology Pager [phone]

## 2015-02-21 LAB — CBC
HCT: 36.2 % (ref 36.0–46.0)
Hemoglobin: 11.8 g/dL — ABNORMAL LOW (ref 12.0–15.0)
MCH: 29.3 pg (ref 26.0–34.0)
MCHC: 32.6 g/dL (ref 30.0–36.0)
MCV: 89.8 fL (ref 78.0–100.0)
Platelets: 151 10*3/uL (ref 150–400)
RBC: 4.03 MIL/uL (ref 3.87–5.11)
RDW: 13.8 % (ref 11.5–15.5)
WBC: 10.2 10*3/uL (ref 4.0–10.5)

## 2015-02-21 LAB — GLUCOSE, CAPILLARY
Glucose-Capillary: 103 mg/dL — ABNORMAL HIGH (ref 65–99)
Glucose-Capillary: 151 mg/dL — ABNORMAL HIGH (ref 65–99)
Glucose-Capillary: 162 mg/dL — ABNORMAL HIGH (ref 65–99)
Glucose-Capillary: 165 mg/dL — ABNORMAL HIGH (ref 65–99)
Glucose-Capillary: 236 mg/dL — ABNORMAL HIGH (ref 65–99)
Glucose-Capillary: 80 mg/dL (ref 65–99)
Glucose-Capillary: 84 mg/dL (ref 65–99)

## 2015-02-21 LAB — PROTIME-INR
INR: 1.98 — ABNORMAL HIGH (ref 0.00–1.49)
Prothrombin Time: 22.4 seconds — ABNORMAL HIGH (ref 11.6–15.2)

## 2015-02-21 LAB — BASIC METABOLIC PANEL
Anion gap: 6 (ref 5–15)
BUN: <5 mg/dL — ABNORMAL LOW (ref 6–20)
CO2: 27 mmol/L (ref 22–32)
Calcium: 8.9 mg/dL (ref 8.9–10.3)
Chloride: 109 mmol/L (ref 101–111)
Creatinine, Ser: 0.57 mg/dL (ref 0.44–1.00)
GFR calc Af Amer: >60 mL/min (ref >60)
GFR calc non Af Amer: >60 mL/min (ref >60)
Glucose, Bld: 91 mg/dL (ref 65–99)
Potassium: 3.2 mmol/L — ABNORMAL LOW (ref 3.5–5.1)
Sodium: 142 mmol/L (ref 135–145)

## 2015-02-21 MED ORDER — POTASSIUM CHLORIDE CRYS ER 20 MEQ PO TBCR
40.0000 meq | EXTENDED_RELEASE_TABLET | Freq: Once | ORAL | Status: AC
  Administered 2015-02-21: 40 meq via ORAL
  Filled 2015-02-21: qty 2

## 2015-02-21 MED ORDER — ONDANSETRON HCL 4 MG PO TABS
4.0000 mg | ORAL_TABLET | Freq: Three times a day (TID) | ORAL | Status: DC
  Administered 2015-02-21 – 2015-02-22 (×3): 4 mg via ORAL
  Filled 2015-02-21 (×5): qty 1

## 2015-02-21 MED ORDER — METRONIDAZOLE IN NACL 5-0.79 MG/ML-% IV SOLN
500.0000 mg | Freq: Three times a day (TID) | INTRAVENOUS | Status: DC
  Administered 2015-02-21 – 2015-02-22 (×2): 500 mg via INTRAVENOUS
  Filled 2015-02-21 (×3): qty 100

## 2015-02-21 MED ORDER — WARFARIN SODIUM 5 MG PO TABS
5.0000 mg | ORAL_TABLET | Freq: Once | ORAL | Status: AC
  Administered 2015-02-21: 5 mg via ORAL
  Filled 2015-02-21: qty 1

## 2015-02-21 NOTE — Progress Notes (Signed)
PT Cancellation Note  Patient Details Name: Connie Ruiz MRN: [id number] DOB: [birth year]   Cancelled Treatment:    Pt declined stating she "just wasn't up for it today".  Not a good day.     Nathanial Rancher 02/21/2015, 3:02 PM

## 2015-02-21 NOTE — Progress Notes (Signed)
[place] for Coumadin Indication: hx PE, +antiphospholipid syndrome  Allergies  Allergen Reactions  . Oxycodone-Acetaminophen Shortness Of Breath and Nausea Only  . Propoxyphene N-Acetaminophen Shortness Of Breath and Nausea Only  . Sulfonamide Derivatives Shortness Of Breath and Nausea Only  . Codeine Nausea Only  . Latex Rash  . Metoprolol Rash  . Morphine And Related Rash    IV site on arm is red, patient reports this is improving.  NO shortness of breath reported.    Patient Measurements: Height: 5\' 4"  (162.6 cm) Weight: 170 lb 10.2 oz (77.4 kg) IBW/kg (Calculated) : 54.7  Vital Signs: Temp: 98.8 F (37.1 C) (09/16 0537) Temp Source: Oral (09/16 0537) BP: 145/75 mmHg (09/16 0537) Pulse Rate: 66 (09/16 0537)  Labs:  Recent Labs  02/19/15 0519 02/20/15 0520 02/20/15 1335 02/21/15 0527  HGB  --   --   --  11.8*  HCT  --   --   --  36.2  PLT  --   --   --  151  LABPROT 18.8* 21.2*  --  22.4*  INR 1.57* 1.84*  --  1.98*  CREATININE 0.59  --  0.68 0.57    Estimated Creatinine Clearance: 81 mL/min (by C-G formula based on Cr of 0.57).   Medical History: Past Medical History  Diagnosis Date  . THYROID NODULE, LEFT 04/10/2009  . HYPERLIPIDEMIA 08/21/2007  . GOUT 08/21/2007  . HYPERTENSION 08/21/2007    Dr. Andree Elk, [place] 08/21/2007  . CEREBROVASCULAR ACCIDENT, ACUTE 04/15/2010  . GERD 08/21/2007  . RENAL INSUFFICIENCY 08/21/2007  . LUPUS 08/21/2007  . OSTEOPOROSIS 08/21/2007    Rheumatol at [facility]  . DVT, HX OF 08/21/2007  . CLOSTRIDIUM DIFFICILE COLITIS, HX OF 08/21/2007  . KIDNEY TRANSPLANTATION, HX OF 08/22/2007    s/p renal transplant-Dr. Andree Elk, [facility]  . Pulmonary embolism 07/16/2010  . Renal failure   . Current use of long term anticoagulation     Dr. Andree Elk, [facility]  . Depression     Dr. Andree Elk, [facility]  . History of stroke with residual effects   . Right sided weakness   . CVA 04/17/2010   . Tachycardia   . DIABETES MELLITUS, TYPE II 08/21/2007  . Candida esophagitis 11/12/2014  . Steroid-induced hyperglycemia 11/09/2014    Medications:  PTA Coumadin dose: 5mg  po daily  Assessment: 54 yo F on admitted with sepsis, colitis.  She is on chronic Coumadin for hx PE, +antiphospholipid syndrome.  She has been on Coumadin 5mg  po daily ~ 2 weeks per outpatient [facility] flowsheet.  INR was 5.3 on 9/2.  She was instructed to skip doses x 2 days then resume 5mg  daily at that time.   INR remains elevated on admission INR = 5.89).  Last dose 9/7. CBC ok, no bleeding noted. -vit K 5mg  SQ on 9/9, 10mg  SQ on 9/10)  Today, 02/21/2015: - INR 1.98 - Hg stable; pltc WNL - No bleeding documented - Major Drug Interactions:none  - Diet: soft, thin diet;   Goal of Therapy:  INR 2-3   Plan:  - Coumadin today with 5 mg PO x1 - Daily INR - monitor for s/s bleeding - Continue Lovenox 75mg  SQ q12h until INR is therapeutic   Dolly Rias RPh 02/21/2015, 7:23 AM Pager [phone]

## 2015-02-21 NOTE — Progress Notes (Signed)
Occupational Therapy Treatment Patient Details Name: Connie Ruiz MRN: [id number] DOB: Oct 15, 1960 Today's Date: 02/21/2015    History of present illness Patient is a 54 y/o female admitted with abdominal pain  PMH positive for antiphospholipid syndrome, renal transplant, CVA with R residual deficits, DVT w/ PE, DM, lupus.   OT comments  Patient making slow progress, will continue plan of care for now. Pt limited this session by nausea/vomitting and "dizziness". Pt found supine in bed with nursing present for medication management. Pt engaged in bed mobility with OT, sat EOB ~2 minutes then had to lay back down due to "dizziness", upon laying down pt with nausea. Pt's BP after laying down =159/92; RN aware of nausea/vomitting and BP.   Pt with no goals or education completed from evaluation. Wrote goals and documented on education from this session.    Follow Up Recommendations  Home health OT;Supervision/Assistance - 24 hour    Equipment Recommendations  None recommended by OT    Recommendations for Other Services  None at this time   Precautions / Restrictions Precautions Precautions: Fall Restrictions Weight Bearing Restrictions: No    Mobility Bed Mobility Overal bed mobility: Needs Assistance Bed Mobility: Supine to Sit;Sit to Supine     Supine to sit: Mod assist Sit to supine: Mod assist   General bed mobility comments: Mod assist for trunk support/control supine>sit. Pt then required assistance with BLEs back to bed. Pt with increased stomach pain and nausea/vomitting.   Transfers General transfer comment: Did not occur secondary to "dizziness"    Balance Overall balance assessment: Needs assistance Sitting-balance support: No upper extremity supported;Feet supported Sitting balance-Leahy Scale: Fair Standing balance comment: Did not occur    ADL Overall ADL's : Needs assistance/impaired General ADL Comments: Pt continues to require up to mod assist for ADLs and  functional mobility. Pt sat EOB for ~2 minutes and stated she needed to lay back down due to "dizziness". Therapist assisted with sit>supine. BP checked=159/92. Pt with nausea upon laying down and sat back up in bed. Notified NT of nausea, NT notified RN. Pt with vomitting upon OT exiting room. Bed alarm set on bed.      Cognition   Behavior During Therapy: WFL for tasks assessed/performed Overall Cognitive Status: Difficult to assess                 Pertinent Vitals/ Pain       Pain Assessment: Faces Faces Pain Scale: Hurts little more Pain Location: stomach Pain Descriptors / Indicators: Cramping Pain Intervention(s): Limited activity within patient's tolerance;Monitored during session;Repositioned   Frequency Min 2X/week     Progress Toward Goals  OT Goals(current goals can now befound in the care plan section)  Progress towards OT goals: Progressing toward goals  ADL Goals Pt Will Perform Grooming: with supervision;standing Pt Will Perform Lower Body Bathing: with supervision;sit to/from stand Pt Will Perform Lower Body Dressing: with supervision;sit to/from stand Pt Will Transfer to Toilet: with supervision;bedside commode;ambulating Pt Will Perform Tub/Shower Transfer: Tub transfer;rolling walker;ambulating;with supervision;3 in 1 Additional ADL Goal #1: Pt will be supervision for sit<>stands and functional mobility using RW  Plan Discharge plan remains appropriate (Set goals and education, no goals or education entered for patient's evaluation)          Activity Tolerance Other (comment) (limitied by nausea/vomitting and "dizziness")   Patient Left in bed;with call bell/phone within reach;with bed alarm set   Nurse Communication Other (comment) (nausea/vomitting and BP)     Time:  1030-1057 OT Time Calculation (min): 27 min  Charges: OT General Charges $OT Visit: 1 Procedure OT Treatments $Self Care/Home Management : 8-22 mins $Therapeutic Activity: 8-22  mins  CLAY,PATRICIA , MS, OTR/L, CLT Pager: [phone]  02/21/2015, 11:08 AM

## 2015-02-21 NOTE — Care Management Note (Signed)
Case Management Note  Patient Details  Name: SANIYHA RICHESIN MRN: [id number] Date of Birth: [birth year]  Subjective/Objective:  [place] [facility] already following, aware of HHPT/HHOT ordered.Await d/c order.                  Action/Plan:d/c home w/HHC.   Expected Discharge Date:                  Expected Discharge Plan:  [place]  In-House Referral:  NA  Discharge planning Services  CM Consult  Post Acute Care Choice:  Home Health Choice offered to:  Patient  DME Arranged:  N/A DME Agency:  NA  HH Arranged:  PT, OT HH Agency:  [facility]  Status of Service:  Completed, signed off  Medicare Important Message Given:  Yes-second notification given Date Medicare IM Given:    Medicare IM give by:    Date Additional Medicare IM Given:    Additional Medicare Important Message give by:     If discussed at [place] of Stay Meetings, dates discussed:    Additional Comments:  Dessa Phi, RN 02/21/2015, 10:18 AM

## 2015-02-21 NOTE — Progress Notes (Signed)
Progress Note   Subjective  Patient reports she continues to feel a bit better. She has some abdominal tenderness but stable. No fevers.    Objective   Vital signs in last 24 hours: Temp:  [98.8 F (37.1 C)-99.1 F (37.3 C)] 98.8 F (37.1 C) (09/16 0537) Pulse Rate:  [66-94] 66 (09/16 0537) Resp:  [20] 20 (09/16 0537) BP: (126-146)/(75-81) 145/75 mmHg (09/16 0537) SpO2:  [95 %-100 %] 97 % (09/16 0537) Last BM Date: 02/20/15 General:    AA female in NAD Heart:  Regular rate and rhythm; no murmurs Lungs: Respirations even and unlabored, lungs CTA bilaterally Abdomen:  Soft, periumbilical TTP without rebound or guarding. Normal bowel sounds. Extremities:  Without edema. Neurologic:  Alert and oriented,  grossly normal neurologically. Psych:  Cooperative. Normal mood and affect.  Intake/Output from previous day: 09/15 0701 - 09/16 0700 In: 480 [P.O.:480] Out: -  Intake/Output this shift:    Lab Results:  Recent Labs  02/21/15 0527  WBC 10.2  HGB 11.8*  HCT 36.2  PLT 151   BMET  Recent Labs  02/19/15 0519 02/20/15 1335 02/21/15 0527  NA 143 138 142  K 3.3* 3.5 3.2*  CL 109 105 109  CO2 28 27 27   GLUCOSE 84 159* 91  BUN <5* <5* <5*  CREATININE 0.59 0.68 0.57  CALCIUM 8.8* 9.0 8.9   LFT No results for input(s): PROT, ALBUMIN, AST, ALT, ALKPHOS, BILITOT, BILIDIR, IBILI in the last 72 hours. PT/INR  Recent Labs  02/20/15 0520 02/21/15 0527  LABPROT 21.2* 22.4*  INR 1.84* 1.98*    Studies/Results: No results found.     Assessment / Plan:   54 y/o female with history of multiple medical problems including SLE, with multiple recent admissions for abdominal pain and diarrehea. Multiple imaging studies have shown a segment of jejunitis. Enteroscopy was not able to reach the site of concern. She reports she feels over this admission her abdominal pain has improved and her diarrhea has significantly improved. Agree with full course of treatment  for Cdiff, as isolated R sided C Diff (to correlate with CT findings) is possible to have caused some of this presentation. Unclear if the R sided colitis correlates to the jejunitis of if two separate processes, as the latter has been noted on prior imaging with her prior admissions.   Unclear etiology for her jejunitis. Differential includes vasculitis/SLE enteritis, IBD, infectious, malignancy (lymphoma - would be rare), etc. She does appear to respond symptomatically to steroids previously. However, she's been on tacrolimus and other immunosuppressants, and has normal inflammatory markers. The best way to clarify the jejunitis would be with tissue diagnosis, however the only way to obtain this would be with balloon enteroscopy or surgical resection, which she is not a great candidate for given her comorbidities.   Given her ongoing symptoms and multiple admissions for this issue, I would recommend a referral for balloon enteroscopy as an outpatient with tertiary care center given this is not available here. For now she seems to be clinically improving and would continue C diff therapy for full course. If she has a relapse or feels worse could consider repeating colonoscopy. We will assist in coordinating referral to be evaluated for balloon enteroscopy. Please call with questions / concerns.   Branson Cellar, MD [facility] Pager [phone]     Principal Problem:   Colitis Active Problems:   Gout   Essential hypertension   Chronic diastolic congestive heart failure  GERD   LUPUS   DVT, HX OF   KIDNEY TRANSPLANTATION, HX OF   Pulmonary embolism   Anticoagulated   Expressive aphasia   Depression   Right sided weakness   TIA (transient ischemic attack)   Steroid-induced hyperglycemia   Sepsis   Nausea vomiting and diarrhea   Abdominal pain   UTI (lower urinary tract infection)     LOS: 7 days   [place]  02/21/2015, 8:02 AM

## 2015-02-21 NOTE — Progress Notes (Signed)
[facility] PROGRESS NOTE  ETSUKO HUMMEL [id number] DOB: July 15, 1960 DOA: 02/13/2015 PCP: Renato Shin, MD   Brief narrative 54 year old female with history of hypertension, diabetes mellitus, and  anti-phospholipid syndrome on Coumadin, status post renal transplant on immunosuppressants, history of CVA with right added weakness and expressive aphasia, history of being 2012, jejunitis, GERD, depression who presented to the ED with 2 day history of nausea, vomiting,  several watery diarrhea and lower abdominal pain. In the ED patient found to be septic with tachycardia, WBC 15.7. Mild hypokalemia. She had supratherapeutic INR of 5.89. UA positive for UTI. Remaining blood work pending. CT of the abdomen and pelvis showed inflammatory changes in the colon and jejunum suggestive of enteritis and colitis. Also showed gas wall thickening in the bladder suggestive of cystitis. Patient admitted to telemetry on empiric antibiotics.  Assessment/Plan: Sepsis, Secondary to enteritis/colitis and UTI. -Resolved. Afebrile. Lactic acid normal.  -Preliminary blood cultures negative. Urine cultures: no growth.  - Received Empiric Unasyn and Flagyl, for 5 days.  -C. difficile antigen positive toxin negative. Will follow GI pathogen./  - GI pathogen panel negative.  -Abdominal pain better 5/10. Diarrhea improved.  -discussed with ID will treat for presume C diff. Will change flagyl to vancomycin.  -needs 14 days treatment.  Day 4 vancomycin. I will add IV flagyl, pain persisted worse today and having nausea.  Appreciate GI evaluation.  Prior records from [facility] review, biopsy from colonoscopy unrevealing.   Anti-phospholipid syndrome/history of PE on chronic anticoagulation with warfarin INR supratherapeutic on admission. No signs of bleeding. Warfarin held and received vitamin K.  Coumadin per pharmacy.  Bridging with lovenox. INR at 1.9  Irregular right lung  lobe nodular density Seen on chest  x-ray with a 4 cm left upper lobe and 5 mm inferior right lower lobe. CT of the chest with contrast was negative. Showed a incidental 2.5 cm solid nodule arising from the thyroid isthmus and 1.8 cm irregular density in the left thyroid lobe.  - TSH 2.1, free T3 2.3 and T 4 1.33 just mildly elevated.  - Thyroid ultrasound with nodules. She will need Biopsy outpatient. Daughter aware  Hypokalemia/hypomagnesemia  k normal.  Replete mg.   Essential hypertension Continue Cardizem. Hold losartan for now.  Chronic diastolic CHF 2-D echo from 06/2013 with EF of 60-65% and grade 1 diastolic dysfunction.   GERD Continue Pepcid.  History of anti-phospholipid syndrome On chronic anticoagulation. Continue with coumadin.   Diabetes mellitus type 2 Monitor on sliding scale insulin.  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 On Coumadin. Ordered PT evaluation.  History of renal transplant Continue CellCept, prednisone and Prograf. Prograf level. Renal function stable. Follows with nephrologist at [facility] Renal function stable.   Poor IV access Appreciate PCCM consulted for central line placement. Has left IJ.  Diet: bland diet.   DVT prophylaxis: Supratherapeutic INR.   Code Status: Full code Family Communication: None at bedside Disposition Plan: home when diarrhea abdominal pain improved.    Consultants:  None  Procedures:  CT abdomen and pelvis  Antibiotics:  IV Unasyn and Flagyl since 9/9---9-13  HPI/Subjective: Abdominal pain 8/10. She vomited today   Objective: Filed Vitals:   02/21/15 1328  BP: 145/83  Pulse: 76  Temp: 98.6 F (37 C)  Resp: 20    Intake/Output Summary (Last 24 hours) at 02/21/15 1815 Last data filed at 02/21/15 1400  Gross per 24 hour  Intake    480 ml  Output  0 ml  Net    480 ml   Filed Weights   02/14/15 1733  Weight: 77.4 kg (170 lb 10.2 oz)    Exam:   General: Elderly female lying in  bed in no acute distress  HEENT:  moist oral mucosa, supple neck  Chest: Clear to auscultation bilaterally  CVS: S1 and S2 normal, no murmurs rub or gallop  GI: Soft, nondistended, laparotomy scar+, bowel sounds present, tender to pressure over infraumbilical area  Musculoskeletal: Warm, no edema  CNS: Alert and oriented, expressive aphasia, right-sided weakness (chronic)  Data Reviewed: Basic Metabolic Panel:  Recent Labs Lab 02/14/15 1955  02/16/15 0515 02/17/15 0513 02/18/15 0300 02/19/15 0519 02/20/15 1335 02/21/15 0527  NA 140  < > 138 140 140 143 138 142  K 3.3*  < > 2.9* 3.7 3.5 3.3* 3.5 3.2*  CL 113*  < > 107 106 107 109 105 109  CO2 19*  < > 24 25 26 28 27 27   GLUCOSE 99  < > 77 78 69 84 159* 91  BUN 11  < > <5* <5* <5* <5* <5* <5*  CREATININE 0.70  < > 0.56 0.45 0.51 0.59 0.68 0.57  CALCIUM 8.2*  < > 8.1* 8.4* 8.2* 8.8* 9.0 8.9  MG 1.0*  --  1.2* 1.5* 1.7  --   --   --   < > = values in this interval not displayed. Liver Function Tests: No results for input(s): AST, ALT, ALKPHOS, BILITOT, PROT, ALBUMIN in the last 168 hours. No results for input(s): LIPASE, AMYLASE in the last 168 hours. No results for input(s): AMMONIA in the last 168 hours. CBC:  Recent Labs Lab 02/14/15 1955 02/15/15 0520 02/17/15 1430 02/18/15 0300 02/21/15 0527  WBC 10.1 12.9* 8.8 9.5 10.2  HGB 12.2 12.1 11.7* 12.0 11.8*  HCT 38.3 37.6 35.6* 35.9* 36.2  MCV 92.3 91.5 88.6 90.0 89.8  PLT 211 210 141* 145* 151   Cardiac Enzymes: No results for input(s): CKTOTAL, CKMB, CKMBINDEX, TROPONINI in the last 168 hours. BNP (last 3 results)  Recent Labs  02/14/15 1955  BNP 53.0    ProBNP (last 3 results) No results for input(s): PROBNP in the last 8760 hours.  CBG:  Recent Labs Lab 02/21/15 0415 02/21/15 0739 02/21/15 1113 02/21/15 1343 02/21/15 1538  GLUCAP 84 103* 165* 151* 236*    Recent Results (from the past 240 hour(s))  Culture, blood (x 2)     Status: None    Collection Time: 02/14/15 11:10 AM  Result Value Ref Range Status   Specimen Description BLOOD LEFT FINGER  Final   Special Requests IN PEDIATRIC BOTTLE 3ML  Final   Culture   Final    NO GROWTH 5 DAYS Performed at [facility]    Report Status 02/19/2015 FINAL  Final  C difficile quick screen w PCR reflex     Status: Abnormal   Collection Time: 02/14/15 12:28 PM  Result Value Ref Range Status   C Diff antigen POSITIVE (A) NEGATIVE Final   C Diff toxin NEGATIVE NEGATIVE Final   C Diff interpretation   Final    C. difficile present, but toxin not detected. This indicates colonization. In most cases, this does not require treatment. If patient has signs and symptoms consistent with colitis, consider treatment.  Urine culture     Status: None   Collection Time: 02/14/15  6:21 PM  Result Value Ref Range Status   Specimen Description URINE, CLEAN CATCH  Final   Special Requests NONE  Final   Culture   Final    NO GROWTH 2 DAYS Performed at [facility]    Report Status 02/16/2015 FINAL  Final  Culture, blood (x 2)     Status: None   Collection Time: 02/14/15  7:55 PM  Result Value Ref Range Status   Specimen Description BLOOD RIGHT ARM  Final   Special Requests BOTTLES DRAWN AEROBIC AND ANAEROBIC 5CC  Final   Culture   Final    NO GROWTH 5 DAYS Performed at [facility]    Report Status 02/19/2015 FINAL  Final  Stool culture     Status: None   Collection Time: 02/15/15  8:51 AM  Result Value Ref Range Status   Specimen Description STOOL  Final   Special Requests Immunocompromised  Final   Culture   Final    NO SALMONELLA, SHIGELLA, CAMPYLOBACTER, YERSINIA, OR E.COLI 0157:[id number] ISOLATED Performed at [facility]    Report Status 02/19/2015 FINAL  Final     Studies: No results found.  Scheduled Meds: . calcitRIOL  0.25 mcg Oral Daily  . chlorhexidine  15 mL Mouth/Throat BID  . cholecalciferol  1,000 Units Oral Daily  . diltiazem  240 mg  Oral Daily  . enoxaparin (LOVENOX) injection  75 mg Subcutaneous Q12H  . famotidine  20 mg Oral BID  . folic acid  1 mg Oral Daily  . insulin aspart  0-9 Units Subcutaneous 6 times per day  . metronidazole  500 mg Intravenous Q8H  . multivitamin  1 tablet Oral QHS  . mycophenolate  250 mg Oral BID  . ondansetron  4 mg Oral 3 times per day  . oxybutynin  5 mg Oral TID  . predniSONE  5 mg Oral Q breakfast  . sertraline  100 mg Oral Daily  . sodium chloride  3 mL Intravenous Q12H  . tacrolimus  1 mg Oral BID  . vancomycin  125 mg Oral 4 times per day  . Warfarin - Pharmacist Dosing Inpatient   Does not apply q1800   Continuous Infusions: . sodium chloride 50 mL/hr at 02/21/15 1528      Time spent: 25 minutes    Dantavious Snowball A  [facility] Pager [phone] If 7PM-7AM, please contact night-coverage at [email or web address], password [facility] 02/21/2015, 6:15 PM  LOS: 7 days

## 2015-02-21 NOTE — Care Management Important Message (Addendum)
Important Message  Patient Details IM Letter given to Kathy/Case Manger to present to Patient. Name: Connie Ruiz MRN: [id number] Date of Birth: [birth year]   Medicare Important Message Given:  Yes-third notification given    Camillo Flaming 02/21/2015, 12:54 [place] Message  Patient Details  Name: Connie Ruiz MRN: [id number] Date of Birth: 03-Feb-1961   Medicare Important Message Given:  Yes-third notification given    Camillo Flaming 02/21/2015, 12:53 PM

## 2015-02-22 DIAGNOSIS — M150 Primary generalized (osteo)arthritis: Secondary | ICD-10-CM | POA: Diagnosis not present

## 2015-02-22 LAB — BASIC METABOLIC PANEL
Anion gap: 4 — ABNORMAL LOW (ref 5–15)
Anion gap: 4 — ABNORMAL LOW (ref 5–15)
BUN: <5 mg/dL — ABNORMAL LOW (ref 6–20)
BUN: <5 mg/dL — ABNORMAL LOW (ref 6–20)
CO2: 28 mmol/L (ref 22–32)
CO2: 28 mmol/L (ref 22–32)
Calcium: 8.6 mg/dL — ABNORMAL LOW (ref 8.9–10.3)
Calcium: 8.8 mg/dL — ABNORMAL LOW (ref 8.9–10.3)
Chloride: 109 mmol/L (ref 101–111)
Chloride: 110 mmol/L (ref 101–111)
Creatinine, Ser: 0.57 mg/dL (ref 0.44–1.00)
Creatinine, Ser: 0.56 mg/dL (ref 0.44–1.00)
GFR calc Af Amer: >60 mL/min (ref >60)
GFR calc Af Amer: >60 mL/min (ref >60)
GFR calc non Af Amer: >60 mL/min (ref >60)
GFR calc non Af Amer: >60 mL/min (ref >60)
Glucose, Bld: 74 mg/dL (ref 65–99)
Glucose, Bld: 90 mg/dL (ref 65–99)
Potassium: 5 mmol/L (ref 3.5–5.1)
Potassium: 3.7 mmol/L (ref 3.5–5.1)
Sodium: 141 mmol/L (ref 135–145)
Sodium: 142 mmol/L (ref 135–145)

## 2015-02-22 LAB — GLUCOSE, CAPILLARY
Glucose-Capillary: 99 mg/dL (ref 65–99)
Glucose-Capillary: 111 mg/dL — ABNORMAL HIGH (ref 65–99)
Glucose-Capillary: 134 mg/dL — ABNORMAL HIGH (ref 65–99)

## 2015-02-22 LAB — PROTIME-INR
INR: 2.01 — ABNORMAL HIGH (ref 0.00–1.49)
Prothrombin Time: 22.7 seconds — ABNORMAL HIGH (ref 11.6–15.2)

## 2015-02-22 MED ORDER — WARFARIN SODIUM 5 MG PO TABS: 5.0000 mg | ORAL_TABLET | Freq: Every day | ORAL | Status: DC

## 2015-02-22 MED ORDER — FAMOTIDINE 20 MG PO TABS: 20.0000 mg | ORAL_TABLET | Freq: Two times a day (BID) | ORAL | Status: DC

## 2015-02-22 MED ORDER — WARFARIN SODIUM 5 MG PO TABS
5.0000 mg | ORAL_TABLET | Freq: Once | ORAL | Status: DC
  Filled 2015-02-22: qty 1

## 2015-02-22 MED ORDER — VANCOMYCIN 50 MG/ML ORAL SOLUTION: 125.0000 mg | Freq: Four times a day (QID) | ORAL | Status: DC

## 2015-02-22 NOTE — Discharge Summary (Signed)
Physician Discharge Summary  Connie Ruiz BJS:[id number] DOB: 08/28/1960 DOA: 02/13/2015  PCP: Renato Shin, MD  Admit date: 02/13/2015 Discharge date: 02/22/2015  Time spent: 35 minutes  Recommendations for Outpatient Follow-up:  1. Needs to follow up with primary nephrology.  2. Needs bmet to follow renal function.  3. Needs to finished treatment for presume C diff colitis.   Discharge Diagnoses:     Presume C diff Colitis   Gout   Essential hypertension   Chronic diastolic congestive heart failure   GERD   LUPUS   DVT, HX OF   KIDNEY TRANSPLANTATION, HX OF   Pulmonary embolism   Anticoagulated   Expressive aphasia   Depression   Right sided weakness   TIA (transient ischemic attack)   Steroid-induced hyperglycemia   Sepsis   Nausea vomiting and diarrhea   Abdominal pain   UTI (lower urinary tract infection)   Discharge Condition: stable.   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 In the ED patient found to be septic with tachycardia, WBC 15.7. Mild hypokalemia. She had supratherapeutic INR of 5.89. UA positive for UTI. Remaining blood work pending. CT of the abdomen and pelvis showed inflammatory changes in the colon and jejunum suggestive of enteritis and colitis. Also showed gas wall thickening in the bladder suggestive of cystitis. Patient admitted to telemetry on empiric antibiotics.  Hospital Course:  Assessment/Plan: Sepsis, Secondary to enteritis/colitis and UTI. -Resolved. Afebrile.  Lactic acid normal.  -Preliminary blood cultures negative. Urine cultures: no growth.  - Received Empiric Unasyn and Flagyl, for 5 days.  -C. difficile antigen positive toxin negative. Will follow GI pathogen./  - GI pathogen panel negative.  -Abdominal pain better 5/10. Diarrhea improved.  -discussed with ID will treat for presume C diff. Will change flagyl to vancomycin.  -needs 14 days treatment.  Day 5 vancomycin. After schedule zofran nausea is better. She is feeling better, willing to go home. Appreciate GI evaluation.  Prior records from [facility] review, biopsy from colonoscopy unrevealing.   Anti-phospholipid syndrome/history of PE on chronic anticoagulation with warfarin INR supratherapeutic on admission. No signs of bleeding. Warfarin held and received vitamin K.  Coumadin per pharmacy.  INR at 2. Discharge on 5 mg daily. INR to be check in 48 hours.   Irregular right lung lobe nodular density Seen on chest x-ray with a 4 cm left upper lobe and 5 mm inferior right lower lobe. CT of the chest with contrast was negative. Showed a incidental 2.5 cm solid nodule arising from the thyroid isthmus and 1.8 cm irregular density in the left thyroid lobe.  - TSH 2.1, free T3 2.3 and T 4 1.33 just mildly elevated.  - Thyroid ultrasound with nodules. She will need Biopsy outpatient. Daughter aware  Hypokalemia/hypomagnesemia  k normal.  Replete mg.   Essential hypertension Continue Cardizem. resume losartan.   Chronic diastolic CHF 2-D echo from 06/2013 with EF of 60-65% and grade 1 diastolic dysfunction.   GERD Continue Pepcid.  History of anti-phospholipid syndrome On chronic anticoagulation. Continue with coumadin. INR at  2.   Diabetes mellitus type 2 Monitor on sliding scale insulin.  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 On Coumadin. Ordered PT evaluation.  History of renal transplant Continue CellCept, prednisone and  Prograf. Prograf level. Renal function stable. Follows with nephrologist at [facility] Renal function stable.   Poor IV access Appreciate PCCM consulted for central line placement. Has left IJ.  Diet: bland diet.   Procedures: None  Consultations:  GI  Discharge Exam: Filed Vitals:   02/22/15 0411  BP: 163/87  Pulse: 84  Temp: 98.8 F (37.1 C)  Resp: 16    General: NAD Cardiovascular: S 1, S 2 RRR Respiratory: CTA Abdomen soft, mild tenderness, NR  Discharge Instructions   Discharge Instructions    Diet - low sodium heart healthy    Complete by:  As directed      Increase activity slowly    Complete by:  As directed           Current Discharge Medication List    START taking these medications   Details  famotidine (PEPCID) 20 MG tablet Take 1 tablet (20 mg total) by mouth 2 (two) times daily. Qty: 30 tablet, Refills: 0    vancomycin (VANCOCIN) 50 mg/mL oral solution Take 2.5 mLs (125 mg total) by mouth every 6 (six) hours. Qty: 100 mL, Refills: 0      CONTINUE these medications which have CHANGED   Details  warfarin (COUMADIN) 5 MG tablet Take 1 tablet (5 mg total) by mouth daily. Qty: 40 tablet, Refills: 1      CONTINUE these medications which have NOT CHANGED   Details  alendronate (FOSAMAX) 70 MG tablet Take 70 mg by mouth once a week. Take with a full glass of water on an empty stomach.    calcitRIOL (ROCALTROL) 0.25 MCG capsule TAKE ONE CAPSULE BY MOUTH DAILY Qty: 30 capsule, Refills: 0    diltiazem (TIAZAC) 240 MG 24 hr capsule Take 1 capsule (240 mg total) by mouth daily. Qty: 30 capsule, Refills: 11    losartan (COZAAR) 100 MG tablet Take 1 tablet (100 mg total) by mouth daily. Qty: 30 tablet, Refills: 3    mycophenolate (CELLCEPT) 250 MG capsule Take 250 mg by mouth 2 (two) times daily.    oxybutynin (DITROPAN) 5 MG tablet Take 5 mg by mouth 3 (three) times daily.    predniSONE (DELTASONE) 5 MG tablet Take 5 mg by mouth daily with  breakfast.    sertraline (ZOLOFT) 100 MG tablet Take 1 tablet (100 mg total) by mouth daily. Qty: 30 tablet, Refills: 11    tacrolimus (PROGRAF) 1 MG capsule Take 1 mg by mouth 2 (two) times daily.     ammonium lactate (AMLACTIN) 12 % cream Apply topically as needed for dry skin.    Blood Glucose Monitoring Suppl (ONETOUCH VERIO IQ SYSTEM) W/DEVICE KIT Use to check blood sugar 1 time per day Qty: 1 kit, Refills: 0    glucose blood (ONE TOUCH ULTRA TEST) test strip Use to check blood sugar 1 time per day Qty: 100 each, Refills: 2    nystatin (MYCOSTATIN) powder Apply 1 g topically 4 (four) times daily as needed. For yeast under breast    ondansetron (ZOFRAN ODT) 4 MG disintegrating tablet 97m ODT q4 hours prn nausea/vomiting Qty: 15 tablet, Refills: 0    ONETOUCH DELICA LANCETS 316XMISC Use to check blood sugar 1 time per day Qty: 100 each, Refills: 2    triamcinolone (  KENALOG) 0.025 % cream Apply 1 application topically daily as needed (dry skin).       STOP taking these medications     esomeprazole (NEXIUM) 20 MG capsule      b complex-vitamin c-folic acid (NEPHRO-VITE) 0.8 MG TABS tablet      Cholecalciferol (VITAMIN D-3 PO)      folic acid (FOLVITE) 1 MG tablet        Allergies  Allergen Reactions  . Oxycodone-Acetaminophen Shortness Of Breath and Nausea Only  . Propoxyphene N-Acetaminophen Shortness Of Breath and Nausea Only  . Sulfonamide Derivatives Shortness Of Breath and Nausea Only  . Codeine Nausea Only  . Latex Rash  . Metoprolol Rash  . Morphine And Related Rash    IV site on arm is red, patient reports this is improving.  NO shortness of breath reported.   Follow-up Information    Follow up with Renato Shin, MD In 3 days.   Specialty:  Endocrinology   Contact information:   [address] [place] [address] [phone]       Follow up with [facility].   Why:  Home Health RN, Physical Therapy, Occupational Therapy,  Social Worker and aide   Contact information:   [address] [place] [address] [phone]       Follow up with Renato Shin, MD In 1 week.   Specialty:  Endocrinology   Contact information:   [address] [place] [address] [phone]        The results of significant diagnostics from this hospitalization (including imaging, microbiology, ancillary and laboratory) are listed below for reference.    Significant Diagnostic Studies: Ct Chest W Contrast  02/15/2015   CLINICAL DATA:  Left lung nodule.  EXAM: CT CHEST WITH CONTRAST  TECHNIQUE: Multidetector CT imaging of the chest was performed during intravenous contrast administration.  CONTRAST:  15m OMNIPAQUE IOHEXOL 300 MG/ML  SOLN  COMPARISON:  Chest radiograph of February 14, 2015.  FINDINGS: No pneumothorax or pleural effusion is noted. Minimal biapical scarring is noted. Mild nodular pleural thickening is noted in the right lung base with associated parenchymal densities most consistent with scarring. No discrete pulmonary nodule or mass is noted. No acute pulmonary disease is noted. Atherosclerosis of thoracic aorta is noted without aneurysm or dissection. Mild enlargement of pulmonary arteries are noted centrally suggesting pulmonary artery hypertension. Coronary artery calcifications are noted. No mediastinal mass or adenopathy is noted. 1.8 cm irregular low density is noted in left thyroid lobe. 2.5 cm mass or nodule arises anteriorly from the isthmus of the thyroid gland.  In the visualized portion of the upper abdomen, the patient is status post cholecystectomy. Bilateral renal atrophy is noted consistent with renal insufficiency.  IMPRESSION: No discrete pulmonary nodule or mass is noted. No mediastinal mass or adenopathy is noted.  Coronary artery calcifications are noted suggesting coronary artery disease.  2.5 cm solid nodule is seen arising anteriorly from the isthmus of the thyroid gland,  with 1.8 cm irregular density seen in left thyroid lobe. Thyroid ultrasound is recommended for further evaluation.   Electronically Signed   By: JMarijo Conception M.D.   On: 02/15/2015 15:01   [place] Tissue Head/neck  02/17/2015   CLINICAL DATA:  Thyroid nodule with on recent CT chest  EXAM: THYROID ULTRASOUND  TECHNIQUE: Ultrasound examination of the thyroid gland and adjacent soft tissues was performed.  COMPARISON:  CT 02/15/2015 and earlier studies  FINDINGS: Right thyroid lobe  Measurements: 41 x 18 x 19 mm. 5 x 4 mm complex nodule, mid lobe. Smaller 3 mm hypoechoic nodule, inferior pole.  Left thyroid lobe  Measurements: 46 x 27 x 26 mm. Dominant 29 x 20 x 19 mm mostly solid nodule, mid lobe.  Isthmus  Thickness: 12 mm.  Solitary 25 x 17 x29 mm nodule, left of midline  Lymphadenopathy  None visualized.  IMPRESSION: 1. Normal-sized thyroid with multiple nodules. The dominant left and isthmic lesions meet consensus criteria for biopsy. Ultrasound-guided fine needle aspiration should be considered, as per the consensus statement: Management of Thyroid Nodules Detected at [place]: Society of Radiologists in [place]. Radiology 2005; [id number].   Electronically Signed   By: Lucrezia Europe M.D.   On: 02/17/2015 13:24   Ct Abdomen Pelvis W Contrast  02/14/2015   CLINICAL DATA:  Generalized weakness and abdominal pain. Nausea, vomiting, and diarrhea over the last 2 days.  EXAM: CT ABDOMEN AND PELVIS WITH CONTRAST  TECHNIQUE: Multidetector CT imaging of the abdomen and pelvis was performed using the standard protocol following bolus administration of intravenous contrast.  CONTRAST:  44m OMNIPAQUE IOHEXOL 300 MG/ML SOLN, 1080mOMNIPAQUE IOHEXOL 300 MG/ML SOLN  COMPARISON:  11/07/2014  FINDINGS: The lung bases are clear. Residual contrast material in the lower esophagus may indicate reflux or dysmotility. Small esophageal hiatal hernia. Coronary artery calcifications.  Surgical absence of  the gallbladder. Mild bile duct dilatation is likely normal for postcholecystectomy physiology. The liver, spleen, pancreas, adrenal glands, abdominal aorta, inferior vena cava, and retroperitoneal lymph nodes are unremarkable. Diffuse parenchymal atrophy involving both kidneys. No hydronephrosis. Stomach appears normal. Small bowel and colon are mostly decompressed. Suggestion of possible jejunal and colonic wall thickening although evaluation is difficult due to under distention. Mild colitis/enteritis not excluded. No free air or free fluid in the abdomen.  Pelvis: Pelvic transplant kidney demonstrates normal nephrogram. No hydronephrosis. Appendix is normal. Rectosigmoid colon is decompressed. Fluid in the rectum consistent with history of diarrhea and also possibly indicating colitis. Uterus and ovaries are not enlarged. The bladder is decompressed. There is gas in the bladder with possible bladder wall thickening. This may indicate cystitis or previous catheterization. Nodular infiltration in the subcutaneous fat over the lower abdominal wall may represent injection sites. No destructive bone lesions.  IMPRESSION: Small esophageal hiatal hernia with possible reflux or dysmotility shown in the distal esophagus. Atrophic native kidneys with normal appearing transplant kidney. Bowel is decompressed, limiting evaluation but there is suggestion of inflammatory change in the colon and jejunum possibly indicating enteritis and colitis. Gas and wall thickening in the bladder suggesting cystitis versus previous instrumentation.   Electronically Signed   By: WiLucienne Capers.D.   On: 02/14/2015 00:44   Dg Chest Port 1 View  02/14/2015   CLINICAL DATA:  Evaluate central line placement  EXAM: PORTABLE CHEST - 1 VIEW  COMPARISON:  10/27/2012  FINDINGS: Left internal jugular central line identified with tip just above the cavoatrial junction. No pneumothorax.  Heart size and vascular pattern are normal. Irregular  opacity measuring about 4 cm over the left upper lobe new from prior studies. 5 mm nodular opacity lateral inferior right lobe also new. All  IMPRESSION: 1.  Central line as described.  2. Consider CT thorax with contrast if possible, to evaluate new nodular density right lower lobe and new irregular 4cm left upper lobe opacity, which could represent a lung mass.   Electronically Signed  By: Skipper Cliche M.D.   On: 02/14/2015 18:02    Microbiology: Recent Results (from the past 240 hour(s))  Culture, blood (x 2)     Status: None   Collection Time: 02/14/15 11:10 AM  Result Value Ref Range Status   Specimen Description BLOOD LEFT FINGER  Final   Special Requests IN PEDIATRIC BOTTLE 3ML  Final   Culture   Final    NO GROWTH 5 DAYS Performed at [facility]    Report Status 02/19/2015 FINAL  Final  C difficile quick screen w PCR reflex     Status: Abnormal   Collection Time: 02/14/15 12:28 PM  Result Value Ref Range Status   C Diff antigen POSITIVE (A) NEGATIVE Final   C Diff toxin NEGATIVE NEGATIVE Final   C Diff interpretation   Final    C. difficile present, but toxin not detected. This indicates colonization. In most cases, this does not require treatment. If patient has signs and symptoms consistent with colitis, consider treatment.  Urine culture     Status: None   Collection Time: 02/14/15  6:21 PM  Result Value Ref Range Status   Specimen Description URINE, CLEAN CATCH  Final   Special Requests NONE  Final   Culture   Final    NO GROWTH 2 DAYS Performed at [facility]    Report Status 02/16/2015 FINAL  Final  Culture, blood (x 2)     Status: None   Collection Time: 02/14/15  7:55 PM  Result Value Ref Range Status   Specimen Description BLOOD RIGHT ARM  Final   Special Requests BOTTLES DRAWN AEROBIC AND ANAEROBIC 5CC  Final   Culture   Final    NO GROWTH 5 DAYS Performed at [facility]    Report Status 02/19/2015 FINAL  Final  Stool culture      Status: None   Collection Time: 02/15/15  8:51 AM  Result Value Ref Range Status   Specimen Description STOOL  Final   Special Requests Immunocompromised  Final   Culture   Final    NO SALMONELLA, SHIGELLA, CAMPYLOBACTER, YERSINIA, OR E.COLI 0157:[id number] ISOLATED Performed at [facility]    Report Status 02/19/2015 FINAL  Final     Labs: Basic Metabolic Panel:  Recent Labs Lab 02/16/15 0515 02/17/15 0513 02/18/15 0300 02/19/15 0519 02/20/15 1335 02/21/15 0527 02/22/15 0430 02/22/15 0840  NA 138 140 140 143 138 142 141 142  K 2.9* 3.7 3.5 3.3* 3.5 3.2* 5.0 3.7  CL 107 106 107 109 105 109 109 110  CO2 '24 25 26 28 27 27 28 28  ' GLUCOSE 77 78 69 84 159* 91 74 90  BUN <5* <5* <5* <5* <5* <5* <5* <5*  CREATININE 0.56 0.45 0.51 0.59 0.68 0.57 0.57 0.56  CALCIUM 8.1* 8.4* 8.2* 8.8* 9.0 8.9 8.6* 8.8*  MG 1.2* 1.5* 1.7  --   --   --   --   --    Liver Function Tests: No results for input(s): AST, ALT, ALKPHOS, BILITOT, PROT, ALBUMIN in the last 168 hours. No results for input(s): LIPASE, AMYLASE in the last 168 hours. No results for input(s): AMMONIA in the last 168 hours. CBC:  Recent Labs Lab 02/17/15 1430 02/18/15 0300 02/21/15 0527  WBC 8.8 9.5 10.2  HGB 11.7* 12.0 11.8*  HCT 35.6* 35.9* 36.2  MCV 88.6 90.0 89.8  PLT 141* 145* 151   Cardiac Enzymes: No results for input(s): CKTOTAL, CKMB, CKMBINDEX,  TROPONINI in the last 168 hours. BNP: BNP (last 3 results)  Recent Labs  02/14/15 1955  BNP 53.0    ProBNP (last 3 results) No results for input(s): PROBNP in the last 8760 hours.  CBG:  Recent Labs Lab 02/21/15 1343 02/21/15 1538 02/21/15 2032 02/22/15 0408 02/22/15 0810  GLUCAP 151* 236* 162* 99 111*       Signed:  Regalado, Belkys A  [facility] 02/22/2015, 11:28 AM

## 2015-02-22 NOTE — Care Management Note (Signed)
Case Management Note  Patient Details  Name: Connie Ruiz MRN: [id number] Date of Birth: [birth year]         Action/Plan: Late entry 11:17 am. NCM made [facility] aware of scheduled dc home with [facility] RN added and  PT/INR on 02/24/2015 with results called to PCP. Pt already arranged for HHPT/OT.   Expected Discharge Date:  02/22/2015               Expected Discharge Plan:  [place]  In-House Referral:  NA  Discharge planning Services  CM Consult  Post Acute Care Choice:  Home Health Choice offered to:  Patient  DME Arranged:  N/A DME Agency:  NA  HH Arranged:  PT, OT, RN [place] Agency:  [place]  Status of Service:  Completed, signed off  Medicare Important Message Given:  Yes-third notification given Date Medicare IM Given:    Medicare IM give by:    Date Additional Medicare IM Given:    Additional Medicare Important Message give by:     If discussed at [place] of Stay Meetings, dates discussed:    Additional Comments:  Erenest Rasher, RN 02/22/2015, 6:24 PM

## 2015-02-22 NOTE — Progress Notes (Signed)
Connie Ruiz to be D/C'd Home per MD order.  Discussed prescriptions and follow up appointments with the patient. Prescriptions given to patient, medication list explained in detail. Pt verbalized understanding.    Medication List    STOP taking these medications        esomeprazole 20 MG capsule  Commonly known as:  [place] these medications        alendronate 70 MG tablet  Commonly known as:  FOSAMAX  Take 70 mg by mouth once a week. Take with a full glass of water on an empty stomach.     ammonium lactate 12 % cream  Commonly known as:  AMLACTIN  Apply topically as needed for dry skin.     calcitRIOL 0.25 MCG capsule  Commonly known as:  ROCALTROL  TAKE ONE CAPSULE BY MOUTH DAILY     diltiazem 240 MG 24 hr capsule  Commonly known as:  TIAZAC  Take 1 capsule (240 mg total) by mouth daily.     famotidine 20 MG tablet  Commonly known as:  PEPCID  Take 1 tablet (20 mg total) by mouth 2 (two) times daily.     glucose blood test strip  Commonly known as:  ONE TOUCH ULTRA TEST  Use to check blood sugar 1 time per day     losartan 100 MG tablet  Commonly known as:  COZAAR  Take 1 tablet (100 mg total) by mouth daily.     mycophenolate 250 MG capsule  Commonly known as:  CELLCEPT  Take 250 mg by mouth 2 (two) times daily.     nystatin powder  Commonly known as:  MYCOSTATIN  Apply 1 g topically 4 (four) times daily as needed. For yeast under breast     ondansetron 4 MG disintegrating tablet  Commonly known as:  ZOFRAN ODT  67m ODT q4 hours prn nausea/vomiting     ONETOUCH DELICA LANCETS 316WMisc  Use to check blood sugar 1 time per day     ONETOUCH VERIO IQ SYSTEM W/DEVICE Kit  Use to check blood sugar 1 time per day     oxybutynin 5 MG tablet  Commonly known as:  DITROPAN  Take 5 mg by mouth 3 (three) times daily.     predniSONE 5 MG tablet  Commonly known as:  DELTASONE  Take 5 mg by mouth daily with breakfast.     sertraline 100 MG tablet   Commonly known as:  ZOLOFT  Take 1 tablet (100 mg total) by mouth daily.     tacrolimus 1 MG capsule  Commonly known as:  PROGRAF  Take 1 mg by mouth 2 (two) times daily.     triamcinolone 0.025 % cream  Commonly known as:  KENALOG  Apply 1 application topically daily as needed (dry skin).     vancomycin 50 mg/mL oral solution  Commonly known as:  VANCOCIN  Take 2.5 mLs (125 mg total) by mouth every 6 (six) hours.     warfarin 5 MG tablet  Commonly known as:  COUMADIN  Take 1 tablet (5 mg total) by mouth daily.        Filed Vitals:   02/22/15 1348  BP: 147/84  Pulse: 110  Temp: 98.8 F (37.1 C)  Resp: 18    Skin clean, dry and intact without evidence of skin break down, no evidence of skin tears noted. IV catheter discontinued intact. Site without signs and symptoms of complications. Dressing and  pressure applied. Pt denies pain at this time. No complaints noted.  An After Visit Summary was printed and given to the patient. Patient escorted via [place], and D/C home via private auto.  [name] 02/22/2015 6:28 PM

## 2015-02-22 NOTE — Progress Notes (Addendum)
[place] for Coumadin Indication: hx PE, +antiphospholipid syndrome  Allergies  Allergen Reactions  . Oxycodone-Acetaminophen Shortness Of Breath and Nausea Only  . Propoxyphene N-Acetaminophen Shortness Of Breath and Nausea Only  . Sulfonamide Derivatives Shortness Of Breath and Nausea Only  . Codeine Nausea Only  . Latex Rash  . Metoprolol Rash  . Morphine And Related Rash    IV site on arm is red, patient reports this is improving.  NO shortness of breath reported.    Patient Measurements: Height: 5\' 4"  (162.6 cm) Weight: 170 lb 10.2 oz (77.4 kg) IBW/kg (Calculated) : 54.7  Vital Signs: Temp: 98.8 F (37.1 C) (09/17 0411) Temp Source: Oral (09/17 0411) BP: 163/87 mmHg (09/17 0411) Pulse Rate: 84 (09/17 0411)  Labs:  Recent Labs  02/20/15 0520  02/21/15 0527 02/22/15 0430 02/22/15 0840  HGB  --   --  11.8*  --   --   HCT  --   --  36.2  --   --   PLT  --   --  151  --   --   LABPROT 21.2*  --  22.4* 22.7*  --   INR 1.84*  --  1.98* 2.01*  --   CREATININE  --   < > 0.57 0.57 0.56  < > = values in this interval not displayed.  Estimated Creatinine Clearance: 81 mL/min (by C-G formula based on Cr of 0.56).   Medical History: Past Medical History  Diagnosis Date  . THYROID NODULE, LEFT 04/10/2009  . HYPERLIPIDEMIA 08/21/2007  . GOUT 08/21/2007  . HYPERTENSION 08/21/2007    Dr. Andree Elk, [place] 08/21/2007  . CEREBROVASCULAR ACCIDENT, ACUTE 04/15/2010  . GERD 08/21/2007  . RENAL INSUFFICIENCY 08/21/2007  . LUPUS 08/21/2007  . OSTEOPOROSIS 08/21/2007    Rheumatol at [facility]  . DVT, HX OF 08/21/2007  . CLOSTRIDIUM DIFFICILE COLITIS, HX OF 08/21/2007  . KIDNEY TRANSPLANTATION, HX OF 08/22/2007    s/p renal transplant-Dr. Andree Elk, [facility]  . Pulmonary embolism 07/16/2010  . Renal failure   . Current use of long term anticoagulation     Dr. Andree Elk, [facility]  . Depression     Dr. Andree Elk, [facility]  . History of  stroke with residual effects   . Right sided weakness   . CVA 04/17/2010  . Tachycardia   . DIABETES MELLITUS, TYPE II 08/21/2007  . Candida esophagitis 11/12/2014  . Steroid-induced hyperglycemia 11/09/2014    Medications:  PTA Coumadin dose: 5mg  po daily  Assessment: 54 yo F on admitted with sepsis, colitis.  She is on chronic Coumadin for hx PE, +antiphospholipid syndrome.  She has been on Coumadin 5mg  po daily ~ 2 weeks per outpatient [facility] flowsheet.  INR was 5.3 on 9/2.  She was instructed to skip doses x 2 days then resume 5mg  daily at that time.   INR remains elevated on admission INR = 5.89).  Last dose 9/7. CBC ok, no bleeding noted. -vit K 5mg  SQ on 9/9, 10mg  SQ on 9/10)  Today, 02/22/2015: - INR therapeutic - Hg stable; pltc WNL - No bleeding documented - Major Drug Interactions: IV Flagyl added last PM  Goal of Therapy:  INR 2-3   Plan:  - Repeat warfarin 5mg  today - Daily INR - monitor for s/s bleeding with re-addition of Flagyl - Discontinue Lovenox with therapeutic INR   Adrian Saran, PharmD, BCPS Pager [phone] 02/22/2015 10:06 AM

## 2015-02-23 DIAGNOSIS — M150 Primary generalized (osteo)arthritis: Secondary | ICD-10-CM | POA: Diagnosis not present

## 2015-02-24 ENCOUNTER — Emergency Department (HOSPITAL_COMMUNITY): Payer: Medicare Other

## 2015-02-24 ENCOUNTER — Telehealth: Payer: Self-pay | Admitting: Endocrinology

## 2015-02-24 ENCOUNTER — Telehealth: Payer: Self-pay | Admitting: *Deleted

## 2015-02-24 ENCOUNTER — Observation Stay (HOSPITAL_COMMUNITY)
Admission: EM | Admit: 2015-02-24 | Discharge: 2015-02-27 | Disposition: A | Discharge status: Home / Self Care | Payer: Medicare Other | Attending: Family Medicine | Admitting: Family Medicine

## 2015-02-24 DIAGNOSIS — Z79899 Other long term (current) drug therapy: Secondary | ICD-10-CM | POA: Insufficient documentation

## 2015-02-24 DIAGNOSIS — N19 Unspecified kidney failure: Secondary | ICD-10-CM | POA: Diagnosis not present

## 2015-02-24 DIAGNOSIS — Z8673 Personal history of transient ischemic attack (TIA), and cerebral infarction without residual deficits: Secondary | ICD-10-CM | POA: Diagnosis not present

## 2015-02-24 DIAGNOSIS — R6889 Other general symptoms and signs: Secondary | ICD-10-CM | POA: Diagnosis not present

## 2015-02-24 DIAGNOSIS — I509 Heart failure, unspecified: Secondary | ICD-10-CM | POA: Diagnosis not present

## 2015-02-24 DIAGNOSIS — M109 Gout, unspecified: Secondary | ICD-10-CM | POA: Diagnosis not present

## 2015-02-24 DIAGNOSIS — E119 Type 2 diabetes mellitus without complications: Secondary | ICD-10-CM | POA: Diagnosis not present

## 2015-02-24 DIAGNOSIS — E041 Nontoxic single thyroid nodule: Secondary | ICD-10-CM | POA: Diagnosis not present

## 2015-02-24 DIAGNOSIS — A0472 Enterocolitis due to Clostridium difficile, not specified as recurrent: Secondary | ICD-10-CM

## 2015-02-24 DIAGNOSIS — R197 Diarrhea, unspecified: Secondary | ICD-10-CM | POA: Diagnosis not present

## 2015-02-24 DIAGNOSIS — Z7901 Long term (current) use of anticoagulants: Secondary | ICD-10-CM | POA: Insufficient documentation

## 2015-02-24 DIAGNOSIS — E785 Hyperlipidemia, unspecified: Secondary | ICD-10-CM | POA: Insufficient documentation

## 2015-02-24 DIAGNOSIS — E876 Hypokalemia: Secondary | ICD-10-CM | POA: Diagnosis not present

## 2015-02-24 DIAGNOSIS — A047 Enterocolitis due to Clostridium difficile: Secondary | ICD-10-CM | POA: Diagnosis not present

## 2015-02-24 DIAGNOSIS — N189 Chronic kidney disease, unspecified: Secondary | ICD-10-CM

## 2015-02-24 DIAGNOSIS — M150 Primary generalized (osteo)arthritis: Secondary | ICD-10-CM | POA: Diagnosis not present

## 2015-02-24 DIAGNOSIS — F329 Major depressive disorder, single episode, unspecified: Secondary | ICD-10-CM | POA: Insufficient documentation

## 2015-02-24 DIAGNOSIS — K219 Gastro-esophageal reflux disease without esophagitis: Secondary | ICD-10-CM | POA: Insufficient documentation

## 2015-02-24 DIAGNOSIS — I6789 Other cerebrovascular disease: Secondary | ICD-10-CM | POA: Insufficient documentation

## 2015-02-24 DIAGNOSIS — Z86711 Personal history of pulmonary embolism: Secondary | ICD-10-CM | POA: Insufficient documentation

## 2015-02-24 DIAGNOSIS — I10 Essential (primary) hypertension: Secondary | ICD-10-CM | POA: Insufficient documentation

## 2015-02-24 DIAGNOSIS — K625 Hemorrhage of anus and rectum: Secondary | ICD-10-CM | POA: Diagnosis not present

## 2015-02-24 DIAGNOSIS — M810 Age-related osteoporosis without current pathological fracture: Secondary | ICD-10-CM | POA: Insufficient documentation

## 2015-02-24 DIAGNOSIS — Z940 Kidney transplant status: Secondary | ICD-10-CM | POA: Diagnosis not present

## 2015-02-24 DIAGNOSIS — R1030 Lower abdominal pain, unspecified: Secondary | ICD-10-CM | POA: Diagnosis not present

## 2015-02-24 DIAGNOSIS — R109 Unspecified abdominal pain: Secondary | ICD-10-CM | POA: Diagnosis present

## 2015-02-24 LAB — CBC WITH DIFFERENTIAL/PLATELET
Basophils Absolute: 0 10*3/uL (ref 0.0–0.1)
Basophils Relative: 0 %
Eosinophils Absolute: 0.2 10*3/uL (ref 0.0–0.7)
Eosinophils Relative: 1 %
HCT: 39.3 % (ref 36.0–46.0)
Hemoglobin: 13.1 g/dL (ref 12.0–15.0)
Lymphocytes Relative: 37 %
Lymphs Abs: 5.2 10*3/uL — ABNORMAL HIGH (ref 0.7–4.0)
MCH: 30.2 pg (ref 26.0–34.0)
MCHC: 33.3 g/dL (ref 30.0–36.0)
MCV: 90.6 fL (ref 78.0–100.0)
Monocytes Absolute: 1.3 10*3/uL — ABNORMAL HIGH (ref 0.1–1.0)
Monocytes Relative: 9 %
Neutro Abs: 7.3 10*3/uL (ref 1.7–7.7)
Neutrophils Relative %: 53 %
Platelets: 191 10*3/uL (ref 150–400)
RBC: 4.34 MIL/uL (ref 3.87–5.11)
RDW: 14.8 % (ref 11.5–15.5)
WBC: 13.9 10*3/uL — ABNORMAL HIGH (ref 4.0–10.5)

## 2015-02-24 LAB — COMPREHENSIVE METABOLIC PANEL
ALT: 14 U/L (ref 14–54)
AST: 22 U/L (ref 15–41)
Albumin: 3.3 g/dL — ABNORMAL LOW (ref 3.5–5.0)
Alkaline Phosphatase: 40 U/L (ref 38–126)
Anion gap: 9 (ref 5–15)
BUN: 12 mg/dL (ref 6–20)
CO2: 20 mmol/L — ABNORMAL LOW (ref 22–32)
Calcium: 9.2 mg/dL (ref 8.9–10.3)
Chloride: 110 mmol/L (ref 101–111)
Creatinine, Ser: 0.87 mg/dL (ref 0.44–1.00)
GFR calc Af Amer: >60 mL/min (ref >60)
GFR calc non Af Amer: >60 mL/min (ref >60)
Glucose, Bld: 120 mg/dL — ABNORMAL HIGH (ref 65–99)
Potassium: 3.1 mmol/L — ABNORMAL LOW (ref 3.5–5.1)
Sodium: 139 mmol/L (ref 135–145)
Total Bilirubin: 1 mg/dL (ref 0.3–1.2)
Total Protein: 6.4 g/dL — ABNORMAL LOW (ref 6.5–8.1)

## 2015-02-24 LAB — POC OCCULT BLOOD, ED: Fecal Occult Bld: NEGATIVE

## 2015-02-24 LAB — LIPASE, BLOOD: Lipase: 23 U/L (ref 22–51)

## 2015-02-24 LAB — PROTIME-INR
INR: 1.5 — ABNORMAL HIGH (ref 0.00–1.49)
Prothrombin Time: 18.2 seconds — ABNORMAL HIGH (ref 11.6–15.2)

## 2015-02-24 LAB — I-STAT CG4 LACTIC ACID, ED: Lactic Acid, Venous: 0.76 mmol/L (ref 0.5–2.0)

## 2015-02-24 MED ORDER — POTASSIUM CHLORIDE CRYS ER 20 MEQ PO TBCR
40.0000 meq | EXTENDED_RELEASE_TABLET | Freq: Once | ORAL | Status: AC
  Administered 2015-02-24: 40 meq via ORAL
  Filled 2015-02-24: qty 2

## 2015-02-24 MED ORDER — HYDROMORPHONE HCL 1 MG/ML IJ SOLN
0.5000 mg | Freq: Once | INTRAMUSCULAR | Status: AC
  Administered 2015-02-24: 0.5 mg via INTRAVENOUS
  Filled 2015-02-24: qty 1

## 2015-02-24 MED ORDER — POTASSIUM CHLORIDE 10 MEQ/100ML IV SOLN
10.0000 meq | INTRAVENOUS | Status: AC
  Administered 2015-02-24 (×2): 10 meq via INTRAVENOUS
  Filled 2015-02-24: qty 100

## 2015-02-24 MED ORDER — VANCOMYCIN 50 MG/ML ORAL SOLUTION
125.0000 mg | Freq: Four times a day (QID) | ORAL | Status: DC
  Administered 2015-02-24 – 2015-02-27 (×12): 125 mg via ORAL
  Filled 2015-02-24 (×15): qty 2.5

## 2015-02-24 NOTE — ED Notes (Signed)
Bed: [id number] Expected date:  Expected time:  Means of arrival:  Comments: EMS- 54yo F, RLQ pain and rectal bleeding

## 2015-02-24 NOTE — Telephone Encounter (Signed)
I contacted the pt 's daughter and advised our office had not tried to contact the pt to schedule a appointment. Gertie Fey is handling this appointment. Pt advised to contact the [place] office.

## 2015-02-24 NOTE — Telephone Encounter (Signed)
Spoke with [facility] scheduling and scheduled OV on 03/03/15 at 12:00 PM with Celene Kras, NP. ([place]. 3rd floor)  They will mail patient a packet.Spoke with patient's daughter and she states she will have to call back to get information because she is at work.

## 2015-02-24 NOTE — Telephone Encounter (Signed)
Manus Gunning, MD  Willia Craze, NP; Hulan Saas, RN           Yes, I have seen her and she needs a balloon enteroscopy. If they have that at [facility] she can go there. If not, will need referral to [facility] or [facility] for this.   Thanks much,       Previous Messages     ----- Message -----   From: Willia Craze, NP   Sent: 02/23/2015  8:48 PM    To: Hulan Saas, RN, *   Connie Ruiz,  This patient was seen for a colon screening by [facility] a few years back. She has recently been hospitalized in several surrounding hospitals for abdominal pain and jejunitis. [facility] wants patient to continue care at [facility] since they have equipment for a deep enteroscopy. Armbruster saw her over weekend and agrees with referral back to [facility]. Will you refer her back to them for evaluation of persistent abdominal pain and jejunitis on recent imaging. You may want to discuss with Armbruster. Thanks

## 2015-02-24 NOTE — ED Notes (Signed)
Nurse starting IV 

## 2015-02-24 NOTE — ED Notes (Addendum)
Pt recently discharged 2 days ago and dx with Cdiff. Now c/o RLQ abdominal pain and rectal bleeding. Has not filled RX. Alert and oriented to baseline. Pt has hx of dementia per daughter.

## 2015-02-24 NOTE — Telephone Encounter (Signed)
Returning a call to schedule something at [facility]

## 2015-02-25 ENCOUNTER — Encounter (HOSPITAL_COMMUNITY): Payer: Self-pay

## 2015-02-25 ENCOUNTER — Telehealth: Payer: Self-pay | Admitting: Endocrinology

## 2015-02-25 DIAGNOSIS — M150 Primary generalized (osteo)arthritis: Secondary | ICD-10-CM | POA: Diagnosis not present

## 2015-02-25 DIAGNOSIS — N189 Chronic kidney disease, unspecified: Secondary | ICD-10-CM

## 2015-02-25 DIAGNOSIS — A0472 Enterocolitis due to Clostridium difficile, not specified as recurrent: Secondary | ICD-10-CM

## 2015-02-25 DIAGNOSIS — E876 Hypokalemia: Secondary | ICD-10-CM

## 2015-02-25 DIAGNOSIS — A047 Enterocolitis due to Clostridium difficile: Secondary | ICD-10-CM | POA: Diagnosis not present

## 2015-02-25 LAB — COMPREHENSIVE METABOLIC PANEL
ALT: 14 U/L (ref 14–54)
AST: 24 U/L (ref 15–41)
Albumin: 3.2 g/dL — ABNORMAL LOW (ref 3.5–5.0)
Alkaline Phosphatase: 37 U/L — ABNORMAL LOW (ref 38–126)
Anion gap: 9 (ref 5–15)
BUN: 11 mg/dL (ref 6–20)
CO2: 19 mmol/L — ABNORMAL LOW (ref 22–32)
Calcium: 8.7 mg/dL — ABNORMAL LOW (ref 8.9–10.3)
Chloride: 111 mmol/L (ref 101–111)
Creatinine, Ser: 0.75 mg/dL (ref 0.44–1.00)
GFR calc Af Amer: >60 mL/min (ref >60)
GFR calc non Af Amer: >60 mL/min (ref >60)
Glucose, Bld: 108 mg/dL — ABNORMAL HIGH (ref 65–99)
Potassium: 4.6 mmol/L (ref 3.5–5.1)
Sodium: 139 mmol/L (ref 135–145)
Total Bilirubin: 0.7 mg/dL (ref 0.3–1.2)
Total Protein: 6 g/dL — ABNORMAL LOW (ref 6.5–8.1)

## 2015-02-25 LAB — TSH: TSH: 1.316 u[IU]/mL (ref 0.350–4.500)

## 2015-02-25 LAB — CBC
HCT: 40.3 % (ref 36.0–46.0)
Hemoglobin: 12.7 g/dL (ref 12.0–15.0)
MCH: 29.1 pg (ref 26.0–34.0)
MCHC: 31.5 g/dL (ref 30.0–36.0)
MCV: 92.4 fL (ref 78.0–100.0)
Platelets: 169 10*3/uL (ref 150–400)
RBC: 4.36 MIL/uL (ref 3.87–5.11)
RDW: 15.1 % (ref 11.5–15.5)
WBC: 13.2 10*3/uL — ABNORMAL HIGH (ref 4.0–10.5)

## 2015-02-25 LAB — PROTIME-INR
INR: 1.58 — ABNORMAL HIGH (ref 0.00–1.49)
Prothrombin Time: 18.9 seconds — ABNORMAL HIGH (ref 11.6–15.2)

## 2015-02-25 LAB — C DIFFICILE QUICK SCREEN W PCR REFLEX
C Diff antigen: NEGATIVE
C Diff interpretation: NEGATIVE
C Diff toxin: NEGATIVE

## 2015-02-25 MED ORDER — DILTIAZEM HCL ER BEADS 240 MG PO CP24
240.0000 mg | ORAL_CAPSULE | Freq: Every day | ORAL | Status: DC
  Administered 2015-02-25 – 2015-02-27 (×3): 240 mg via ORAL
  Filled 2015-02-25 (×2): qty 2
  Filled 2015-02-25 (×2): qty 1
  Filled 2015-02-25: qty 2
  Filled 2015-02-25: qty 1

## 2015-02-25 MED ORDER — PANTOPRAZOLE SODIUM 40 MG PO TBEC
40.0000 mg | DELAYED_RELEASE_TABLET | Freq: Every day | ORAL | Status: DC
  Administered 2015-02-25 – 2015-02-26 (×2): 40 mg via ORAL
  Filled 2015-02-25 (×2): qty 1

## 2015-02-25 MED ORDER — HYDROMORPHONE HCL 1 MG/ML IJ SOLN
0.5000 mg | INTRAMUSCULAR | Status: DC | PRN
  Administered 2015-02-25 – 2015-02-27 (×13): 0.5 mg via INTRAVENOUS
  Filled 2015-02-25 (×13): qty 1

## 2015-02-25 MED ORDER — MYCOPHENOLATE MOFETIL 250 MG PO CAPS
250.0000 mg | ORAL_CAPSULE | Freq: Two times a day (BID) | ORAL | Status: DC
  Administered 2015-02-25 – 2015-02-27 (×6): 250 mg via ORAL
  Filled 2015-02-25 (×8): qty 1

## 2015-02-25 MED ORDER — HYOSCYAMINE SULFATE 0.125 MG SL SUBL
0.2500 mg | SUBLINGUAL_TABLET | Freq: Once | SUBLINGUAL | Status: AC
  Administered 2015-02-25: 0.25 mg via SUBLINGUAL
  Filled 2015-02-25: qty 2

## 2015-02-25 MED ORDER — FAMOTIDINE 20 MG PO TABS
20.0000 mg | ORAL_TABLET | Freq: Two times a day (BID) | ORAL | Status: DC
  Administered 2015-02-25 – 2015-02-26 (×3): 20 mg via ORAL
  Filled 2015-02-25 (×3): qty 1

## 2015-02-25 MED ORDER — VANCOMYCIN 50 MG/ML ORAL SOLUTION
125.0000 mg | Freq: Four times a day (QID) | ORAL | Status: DC
Start: 2015-02-25 — End: 2015-02-25

## 2015-02-25 MED ORDER — WARFARIN - PHARMACIST DOSING INPATIENT
Freq: Every day | Status: DC
Start: 2015-02-25 — End: 2015-02-27
  Administered 2015-02-25: 19:00:00

## 2015-02-25 MED ORDER — SERTRALINE HCL 100 MG PO TABS
100.0000 mg | ORAL_TABLET | Freq: Every day | ORAL | Status: DC
  Administered 2015-02-25 – 2015-02-27 (×3): 100 mg via ORAL
  Filled 2015-02-25 (×3): qty 1

## 2015-02-25 MED ORDER — ONDANSETRON HCL 4 MG/2ML IJ SOLN
4.0000 mg | Freq: Four times a day (QID) | INTRAMUSCULAR | Status: DC | PRN
  Administered 2015-02-25 – 2015-02-27 (×5): 4 mg via INTRAVENOUS
  Filled 2015-02-25 (×5): qty 2

## 2015-02-25 MED ORDER — LOSARTAN POTASSIUM 50 MG PO TABS
100.0000 mg | ORAL_TABLET | Freq: Every day | ORAL | Status: DC
  Administered 2015-02-25 – 2015-02-27 (×3): 100 mg via ORAL
  Filled 2015-02-25 (×3): qty 2

## 2015-02-25 MED ORDER — CALCITRIOL 0.25 MCG PO CAPS
0.2500 ug | ORAL_CAPSULE | Freq: Every day | ORAL | Status: DC
  Administered 2015-02-25 – 2015-02-27 (×3): 0.25 ug via ORAL
  Filled 2015-02-25 (×3): qty 1

## 2015-02-25 MED ORDER — TACROLIMUS 1 MG PO CAPS
1.0000 mg | ORAL_CAPSULE | Freq: Two times a day (BID) | ORAL | Status: DC
  Administered 2015-02-25 – 2015-02-27 (×6): 1 mg via ORAL
  Filled 2015-02-25 (×8): qty 1

## 2015-02-25 MED ORDER — WARFARIN SODIUM 7.5 MG PO TABS
7.5000 mg | ORAL_TABLET | ORAL | Status: AC
  Administered 2015-02-25: 7.5 mg via ORAL
  Filled 2015-02-25: qty 1

## 2015-02-25 MED ORDER — OXYBUTYNIN CHLORIDE 5 MG PO TABS
5.0000 mg | ORAL_TABLET | Freq: Three times a day (TID) | ORAL | Status: DC
  Administered 2015-02-25 – 2015-02-27 (×8): 5 mg via ORAL
  Filled 2015-02-25 (×8): qty 1

## 2015-02-25 MED ORDER — ALENDRONATE SODIUM 70 MG PO TABS: 70.0000 mg | ORAL_TABLET | ORAL | Status: DC

## 2015-02-25 MED ORDER — POTASSIUM CHLORIDE CRYS ER 20 MEQ PO TBCR
40.0000 meq | EXTENDED_RELEASE_TABLET | Freq: Once | ORAL | Status: AC
Start: 2015-02-25 — End: 2015-02-25
  Administered 2015-02-25: 40 meq via ORAL
  Filled 2015-02-25: qty 2

## 2015-02-25 MED ORDER — ONDANSETRON HCL 4 MG/2ML IJ SOLN
4.0000 mg | Freq: Four times a day (QID) | INTRAMUSCULAR | Status: DC | PRN
  Administered 2015-02-25: 4 mg via INTRAMUSCULAR
  Filled 2015-02-25 (×2): qty 2

## 2015-02-25 MED ORDER — PREDNISONE 5 MG PO TABS
5.0000 mg | ORAL_TABLET | Freq: Every day | ORAL | Status: DC
  Administered 2015-02-25 – 2015-02-27 (×3): 5 mg via ORAL
  Filled 2015-02-25 (×3): qty 1

## 2015-02-25 MED ORDER — SODIUM CHLORIDE 0.9 % IV SOLN
INTRAVENOUS | Status: DC
  Administered 2015-02-25 – 2015-02-26 (×3): via INTRAVENOUS

## 2015-02-25 MED ORDER — HYDROMORPHONE HCL 1 MG/ML IJ SOLN
1.0000 mg | Freq: Once | INTRAMUSCULAR | Status: AC
  Administered 2015-02-25: 1 mg via INTRAVENOUS
  Filled 2015-02-25: qty 1

## 2015-02-25 MED ORDER — INFLUENZA VAC SPLIT QUAD 0.5 ML IM SUSY
0.5000 mL | PREFILLED_SYRINGE | INTRAMUSCULAR | Status: AC
  Administered 2015-02-26: 0.5 mL via INTRAMUSCULAR
  Filled 2015-02-25 (×2): qty 0.5

## 2015-02-25 MED ORDER — WARFARIN SODIUM 6 MG PO TABS
6.0000 mg | ORAL_TABLET | Freq: Once | ORAL | Status: AC
  Administered 2015-02-25: 6 mg via ORAL
  Filled 2015-02-25: qty 1

## 2015-02-25 MED ORDER — AMMONIUM LACTATE 12 % EX LOTN
TOPICAL_LOTION | CUTANEOUS | Status: DC | PRN
  Administered 2015-02-26: 18:00:00 via TOPICAL
  Filled 2015-02-25: qty 400

## 2015-02-25 NOTE — Progress Notes (Signed)
[facility] PROGRESS NOTE  Connie Ruiz [id number] DOB: 1960-07-11 DOA: 02/24/2015 PCP: Renato Shin, MD  HPI/Subjective: Connie Ruiz is a 54 y.o female who presents to the unit from the ED following an episode of continued abdominal pain and diarrhea. She was seen in the ED on 02/14/15, was diagnosed with C. Diff colitis, and sent home on a prescription of oral vancomycin. CT in ED showed evidence of inflammatory change in the colon and jejunum. According to the patient, her pharmacy did not have oral vancomycin and therefore she did not take it. Upon admission, she reported continued abdominal pain and diarrhea (4-5 BM/day, loose and watery). 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   Today she reports continued abdominal pain (10/10). She has had 2 BM today (loose, non-bloody). Reports nausea, but denies vomiting. Denies fever and chills.    Assessment/Plan: 1. C. Diff Colitis: Patient presenting with prior ED diagnosis of C. Diff colitis. Labs today were negative for C. Diff antigen and toxin (presumable false negative d/t current abx treatment). Currently afebrile with mild leukocytosis (13.2) that is decreaseing, no tachycardia. Patient initially severely tender on palpation and reports nausea. Will give PRN Zofran for nausea and Dilaudid for pain. She refuses to get up from bed due to pain. Will consult PT. Upon re-examination, patient is much more comfortable after pain medication and is able to eat without complaints. Per GI on prior admission, treatment with vancomycin needed x14 days (started on 02/18/15). Plan is to continue Vancomycin for recommended course with pain/nausea management PRN. Continue to monitor stools.  2. CKD: Currently stable (BUN 11, SCr: .75). Plan is to continue to monitor.  3. Hypokalemia: Resolved (4.6).  No additional K needed at this time. Plans is to monitor QD and replete as necessary.   4. GERD: Patient has a history of GERD. Plan is to manage in the hospital with Protonix QD. Monitor for complications.  5. Anticoagulation: Chronic coumadin use for previous DVT. INR today is 1.58. Therapeutic goal is 2-3. Per pharmacy recommendations, we will give Warfarin 6 mg PO 1x and Warfarin 7.5 mg PO 1x with repeat INR tomorrow. Monitor for signs of complication.  6. HTN: Patient has a hx of HTN managed at home with Losartan/Diltiazem. Currently stable at 131/66. Continue meds and monitor.  7. Chronic Diastolic CHF: 2-D echo in January 2015 indicated EF of 60-65%. Currently stable and asymptomatic. Continue to monitor.  8. DM2: Not managed on home medications. Plan is to continue to monitor and manage with sliding scale insulin PRN.  9. Depression: Patient has a history of depression. Currently taking Zoloft. Patient is tearful when asked about her condition and repeats "Why am I like this?" several times. Obvious frustration noted. Plan is to continue Zoloft and monitor for signs of worsening depression.  10. Previous CVA: Prior CVA approximately 2 years ago. Aphasia symptoms still present. No other focal neurological deficits.  11. Renal transplant: Chronic immunosuppression. Renal function stable. [facility] nephrology team at [facility].  12. Goals of care: Patient currently lives alone in an apartment, but has a daughter that helps to manage her care. She has a chronic history of GI issues and most recent ED visit resulted in the need for an outpatient balloon enteroscopy as an outpatient for work up of inflammatory changes in colon and jejunum. Outpatient visit scheduled at [facility] on 03/02/16. Patient was previously unable to obtain outpatient medications because the pharmacy did not have the medication,  thus contributing to a worsening of her symptoms. I believe that if we can help manage her pain and monitor her stools today, then she would be best suited to continue outpatient workup as scheduled  at [facility]. Prior to discharge, we will need to transition her to oral pain medication. Upon discharge, we need remind her that it is possible to call other pharmacies for medication and ensure that she has access to meds.   Code Status: Full Family Communication: None at bedside Disposition Plan: Home when stable DVT Prophylaxis: On chronic Coumadin   Consultants:  None  Procedures:  None  Antibiotics:  See below  Anti-infectives    Start     Dose/Rate Route Frequency Ordered Stop   02/25/15 0200  vancomycin (VANCOCIN) 50 mg/mL oral solution 125 mg  Status:  Discontinued     125 mg Oral 4 times per day 02/25/15 0155 02/25/15 0209   02/25/15 0000  vancomycin (VANCOCIN) 50 mg/mL oral solution 125 mg     125 mg Oral 4 times per day 02/24/15 2047          Objective: Filed Vitals:   02/25/15 0615  BP: 131/66  Pulse: 74  Temp: 97.6 F (36.4 C)  Resp: 16    Intake/Output Summary (Last 24 hours) at 02/25/15 1034 Last data filed at 02/25/15 1005  Gross per 24 hour  Intake    240 ml  Output      0 ml  Net    240 ml   Filed Weights   02/25/15 0150  Weight: 72.4 kg (159 lb 9.8 oz)    Exam:   General:  Ill-appearing woman, laying in bed. Refused to removed covers from her body.  Cardiovascular: RRR, no m/r/g. No peripheral edema noted.   Respiratory: CTA b/l. No wheeze or rhonchi.   Abdomen: + Guarding- Patient pushed my hand away upon initiation of palpation. Unable to fully palpate due to extreme pain response. However, abdomen was soft and non-distended (from what could be examined). + BS.   Musculoskeletal: Age-appropriate muscular strength.   Neuro: Appropriate movement of UE/LE. No focal neurological deficits noted.   Psych: A&Ox3. Mood appropriate. 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 Short term memory intact, but unable to remember 2/3 words on mental status exam.   Data Reviewed: Basic Metabolic Panel:  Recent Labs Lab 02/21/15 0527 02/22/15 0430  02/22/15 0840 02/24/15 1956 02/25/15 0440  NA 142 141 142 139 139  K 3.2* 5.0 3.7 3.1* 4.6  CL 109 109 110 110 111  CO2 27 28 28  20* 19*  GLUCOSE 91 74 90 120* 108*  BUN <5* <5* <5* 12 11  CREATININE 0.57 0.57 0.56 0.87 0.75  CALCIUM 8.9 8.6* 8.8* 9.2 8.7*   Liver Function Tests:  Recent Labs Lab 02/24/15 1956 02/25/15 0440  AST 22 24  ALT 14 14  ALKPHOS 40 37*  BILITOT 1.0 0.7  PROT 6.4* 6.0*  ALBUMIN 3.3* 3.2*    Recent Labs Lab 02/24/15 1956  LIPASE 23   No results for input(s): AMMONIA in the last 168 hours. CBC:  Recent Labs Lab 02/21/15 0527 02/24/15 1956 02/25/15 0440  WBC 10.2 13.9* 13.2*  NEUTROABS  --  7.3  --   HGB 11.8* 13.1 12.7  HCT 36.2 39.3 40.3  MCV 89.8 90.6 92.4  PLT 151 191 169   Cardiac Enzymes: No results for input(s): CKTOTAL, CKMB, CKMBINDEX, TROPONINI in the last 168 hours. BNP (last 3 results)  Recent Labs  02/14/15 1955  BNP 53.0    ProBNP (last 3 results) No results for input(s): PROBNP in the last 8760 hours.  CBG:  Recent Labs Lab 02/21/15 1538 02/21/15 2032 02/22/15 0408 02/22/15 0810 02/22/15 1226  GLUCAP 236* 162* 99 111* 134*    Recent Results (from the past 240 hour(s))  C difficile quick scan w PCR reflex     Status: None   Collection Time: 02/25/15  8:04 AM  Result Value Ref Range Status   C Diff antigen NEGATIVE NEGATIVE Final   C Diff toxin NEGATIVE NEGATIVE Final   C Diff interpretation Negative for toxigenic C. difficile  Final     Studies: Dg Abd 1 View  02/24/2015   CLINICAL DATA:  C diff, vomiting, rectal bleeding, mid abdominal pain  EXAM: ABDOMEN - 1 VIEW  COMPARISON:  CT abdomen pelvis dated 02/14/2015  FINDINGS: Nonobstructive bowel gas pattern.  Cholecystectomy clips.  Visualized osseous structures are within normal limits.  IMPRESSION: Unremarkable abdominal radiograph.   Electronically Signed   By: Julian Hy M.D.   On: 02/24/2015 22:32    Scheduled Meds: . calcitRIOL  0.25  mcg Oral Daily  . diltiazem  240 mg Oral Daily  . famotidine  20 mg Oral BID  . [START ON 02/26/2015] Influenza vac split quadrivalent PF  0.5 mL Intramuscular Tomorrow-1000  . losartan  100 mg Oral Daily  . mycophenolate  250 mg Oral BID  . oxybutynin  5 mg Oral TID  . pantoprazole  40 mg Oral Daily  . predniSONE  5 mg Oral Q breakfast  . sertraline  100 mg Oral Daily  . tacrolimus  1 mg Oral BID  . vancomycin  125 mg Oral 4 times per day  . warfarin  6 mg Oral ONCE-1800  . Warfarin - Pharmacist Dosing Inpatient   Does not apply q1800   Continuous Infusions: . sodium chloride 50 mL/hr at 02/25/15 [id number]    Active Problems:   C. difficile colitis   Hypokalemia   CKD (chronic kidney disease)    Time spent: 20 minutes    Micayla Zeltman, Student-PA  [facility] If 7PM-7AM, please contact night-coverage at [email or web address], password [facility] 02/25/2015, 10:34 AM     Addendum  I personally evaluated patient on 02/25/2015 and agree with the above findings. She is a 54 year old female who was admitted overnight, presented with complaints of abdominal pain and associated diarrhea. She had a previous emergency room visit on 02/14/2015 at which time she was diagnosed with C. difficile colitis and prescribed oral vancomycin. It appears that she did not have this prescription filled and came back to the emergency room with complaints of abdominal pain associated with diarrhea. Labs revealed a white count of 13,200. Abdominal x-ray on admission was unremarkable. Stool for heme occult was negative. On exam she continues to have abdominal pain. She appears to be confused and disoriented, difficult to obtain accurate history. Will continue gentle IV fluid hydration with as needed narcotic analgesia for pain management. She is on oral vancomycin for treatment of C. difficile colitis.

## 2015-02-25 NOTE — Progress Notes (Signed)
ANTICOAGULATION CONSULT NOTE - Initial Consult  Pharmacy Consult for Warfarin Indication: pulmonary embolus, DVT and stroke  Allergies  Allergen Reactions  . Oxycodone-Acetaminophen Shortness Of Breath and Nausea Only  . Propoxyphene N-Acetaminophen Shortness Of Breath and Nausea Only  . Sulfonamide Derivatives Shortness Of Breath and Nausea Only  . Codeine Nausea Only  . Latex Rash  . Metoprolol Rash  . Morphine And Related Rash    IV site on arm is red, patient reports this is improving.  NO shortness of breath reported.    Patient Measurements: Height: 5' 4" (162.6 cm) Weight: 159 lb 9.8 oz (72.4 kg) IBW/kg (Calculated) : 54.7 Heparin Dosing Weight:   Vital Signs: Temp: 98.1 F (36.7 C) (09/20 0150) Temp Source: Oral (09/20 0150) BP: 122/70 mmHg (09/20 0150) Pulse Rate: 90 (09/20 0150)  Labs:  Recent Labs  02/22/15 0840 02/24/15 1956 02/25/15 0440  HGB  --  13.1 12.7  HCT  --  39.3 40.3  PLT  --  191 169  LABPROT  --  18.2* 18.9*  INR  --  1.50* 1.58*  CREATININE 0.56 0.87 0.75    Estimated Creatinine Clearance: 78.4 mL/min (by C-G formula based on Cr of 0.75).   Medical History: Past Medical History  Diagnosis Date  . THYROID NODULE, LEFT 04/10/2009  . HYPERLIPIDEMIA 08/21/2007  . GOUT 08/21/2007  . HYPERTENSION 08/21/2007    Dr. Andree Elk, [place] 08/21/2007  . CEREBROVASCULAR ACCIDENT, ACUTE 04/15/2010  . GERD 08/21/2007  . RENAL INSUFFICIENCY 08/21/2007  . LUPUS 08/21/2007  . OSTEOPOROSIS 08/21/2007    Rheumatol at [facility]  . DVT, HX OF 08/21/2007  . CLOSTRIDIUM DIFFICILE COLITIS, HX OF 08/21/2007  . KIDNEY TRANSPLANTATION, HX OF 08/22/2007    s/p renal transplant-Dr. Andree Elk, [facility]  . Pulmonary embolism 07/16/2010  . Renal failure   . Current use of long term anticoagulation     Dr. Andree Elk, [facility]  . Depression     Dr. Andree Elk, [facility]  . History of stroke with residual effects   . Right sided weakness   . CVA 04/17/2010  .  Tachycardia   . DIABETES MELLITUS, TYPE II 08/21/2007  . Candida esophagitis 11/12/2014  . Steroid-induced hyperglycemia 11/09/2014    Medications:  Prescriptions prior to admission  Medication Sig Dispense Refill Last Dose  . alendronate (FOSAMAX) 70 MG tablet Take 70 mg by mouth once a week. Take on Saturdays with a full glass of water on an empty stomach.   02/15/2015 at Unknown time  . calcitRIOL (ROCALTROL) 0.25 MCG capsule TAKE ONE CAPSULE BY MOUTH DAILY 30 capsule 0 02/23/2015 at Unknown time  . diltiazem (TIAZAC) 240 MG 24 hr capsule Take 1 capsule (240 mg total) by mouth daily. 30 capsule 11 02/23/2015 at Unknown time  . esomeprazole (NEXIUM) 20 MG capsule Take 1 capsule (20 mg total) by mouth daily before breakfast.  3 02/23/2015 at Unknown time  . famotidine (PEPCID) 20 MG tablet Take 1 tablet (20 mg total) by mouth 2 (two) times daily. 30 tablet 0 02/23/2015 at Unknown time  . losartan (COZAAR) 100 MG tablet Take 1 tablet (100 mg total) by mouth daily. 30 tablet 3 02/23/2015 at Unknown time  . mycophenolate (CELLCEPT) 250 MG capsule Take 250 mg by mouth 2 (two) times daily.   02/23/2015 at Unknown time  . ondansetron (ZOFRAN ODT) 4 MG disintegrating tablet 22m ODT q4 hours prn nausea/vomiting (Patient taking differently: Take 4 mg by mouth every 8 (eight) hours as  needed for nausea. ) 15 tablet 0 02/23/2015 at Unknown time  . ONETOUCH DELICA LANCETS 97D MISC Use to check blood sugar 1 time per day 100 each 2   . oxybutynin (DITROPAN) 5 MG tablet Take 5 mg by mouth 3 (three) times daily.   02/23/2015 at Unknown time  . predniSONE (DELTASONE) 5 MG tablet Take 5 mg by mouth daily with breakfast.   02/23/2015 at Unknown time  . sertraline (ZOLOFT) 100 MG tablet Take 1 tablet (100 mg total) by mouth daily. 30 tablet 11 02/23/2015 at Unknown time  . tacrolimus (PROGRAF) 1 MG capsule Take 1 mg by mouth 2 (two) times daily.    02/23/2015 at Unknown time  . warfarin (COUMADIN) 5 MG tablet Take 1 tablet (5 mg  total) by mouth daily. 40 tablet 1 02/23/2015 at 2000  . ammonium lactate (AMLACTIN) 12 % cream Apply topically as needed for dry skin.   unknown at unknown time  . Blood Glucose Monitoring Suppl (ONETOUCH VERIO IQ SYSTEM) W/DEVICE KIT Use to check blood sugar 1 time per day 1 kit 0   . glucose blood (ONE TOUCH ULTRA TEST) test strip Use to check blood sugar 1 time per day 100 each 2   . nystatin (MYCOSTATIN) powder Apply 1 g topically 4 (four) times daily as needed. For yeast under breast   unknown at unknown time  . triamcinolone (KENALOG) 0.025 % cream Apply 1 application topically daily as needed (dry skin).    unknown at unknown time  . vancomycin (VANCOCIN) 50 mg/mL oral solution Take 2.5 mLs (125 mg total) by mouth every 6 (six) hours. 100 mL 0 02/22/2015 at unknown time   Scheduled:  . calcitRIOL  0.25 mcg Oral Daily  . diltiazem  240 mg Oral Daily  . famotidine  20 mg Oral BID  . losartan  100 mg Oral Daily  . mycophenolate  250 mg Oral BID  . oxybutynin  5 mg Oral TID  . pantoprazole  40 mg Oral Daily  . predniSONE  5 mg Oral Q breakfast  . sertraline  100 mg Oral Daily  . tacrolimus  1 mg Oral BID  . vancomycin  125 mg Oral 4 times per day  . warfarin  6 mg Oral ONCE-1800  . Warfarin - Pharmacist Dosing Inpatient   Does not apply q1800    Assessment: Patient with history of DVT, PE, and CVA.  INR < 2 on admit.  INR still < 2 this AM.  Goal of Therapy:  INR 2-3    Plan:  Warfarin 7.40m po x1 (given 0239 9/20) Warfarin 661mpo x1 at 1800 9/20 Daily INR  GrTyler DeisJuShea Stakesrowford 02/25/2015,5:50 AM

## 2015-02-25 NOTE — Progress Notes (Signed)
PT Cancellation Note  Patient Details Name: NILEY ASSENMACHER MRN: [id number] DOB: [birth year]   Cancelled Treatment:    Reason Eval/Treat Not Completed: Medical issues which prohibited therapy;Pain limiting ability to participate;Fatigue/lethargy limiting ability to participate   Claretha Cooper 02/25/2015, 10:08 AM 02/25/2015  Tresa Endo PT [phone]

## 2015-02-25 NOTE — Telephone Encounter (Signed)
See note below

## 2015-02-25 NOTE — Telephone Encounter (Signed)
Spoke with patient's daughter and gave her the information and phone number for [facility]. She states her mother is back in [facility].

## 2015-02-25 NOTE — H&P (Signed)
[facility] History and Physical  Connie Ruiz DOB: June 07, 1961 DOA: 02/24/2015  Referring physician: PCP: Renato Shin, MD   Chief Complaint: Diarrhea  HPI: Connie Ruiz is a 54 y.o. female with prior history of HTN DM renal transplant lupus DVT on coumadin presents with diarrhea. Patient had been admitted on 9/9 with cramping diarrhea abdominal pain. She was though to have C Diff colitis and was discharged home with a prescription for oral vancocin. Patient apparently could not get the medication as it was not available at her pharmacy. She has had ongoing diarrhea and abdominal pain. She states maybe 4-5 BM per day. They are watery and loose. She has some nausea noted. She has had no fevers or chills noted. She has no blood in her stools but states her stool is orange appearing. No vomiting at this time. ED requested admission as she is not able to get medications. She has an improved creatinine from discharge but has abdominal pain and requires pain control.   Review of Systems:  Complete systems reviewed and unremarkable other than HPI   Past Medical History  Diagnosis Date  . THYROID NODULE, LEFT 04/10/2009  . HYPERLIPIDEMIA 08/21/2007  . GOUT 08/21/2007  . HYPERTENSION 08/21/2007    Dr. Andree Elk, [place] 08/21/2007  . CEREBROVASCULAR ACCIDENT, ACUTE 04/15/2010  . GERD 08/21/2007  . RENAL INSUFFICIENCY 08/21/2007  . LUPUS 08/21/2007  . OSTEOPOROSIS 08/21/2007    [facility]umat[facility] at [facility]  . DVT, HX OF 08/21/2007  . CLOSTRIDIUM DIFFICILE COLITIS, HX OF 08/21/2007  . KIDNEY TRANSPLANTATION, HX OF 08/22/2007    s/p renal transplant-Dr. Andree Elk, [facility]  . Pulmonary embolism 07/16/2010  . Renal failure   . Current use of long term anticoagulation     Dr. Andree Elk, [facility]  . Depression     Dr. Andree Elk, [facility]  . History of stroke with residual effects   . Right sided weakness   . CVA 04/17/2010  . Tachycardia   . DIABETES MELLITUS, TYPE II 08/21/2007    . Candida esophagitis 11/12/2014  . Steroid-induced hyperglycemia 11/09/2014   Past Surgical History  Procedure Laterality Date  . Cholecystectomy    . Tubal ligation    . Kidney transplant Right 2009  . Renal biopsy, open  1981  . Enteroscopy N/A 11/11/2014    Procedure: ENTEROSCOPY;  Surgeon: Ladene Artist, MD;  Location: [facility];  Service: Endoscopy;  Laterality: N/A;   Social History:  reports that she has never smoked. She has never used smokeless tobacco. She reports that she does not drink alcohol or use illicit drugs.  Allergies  Allergen Reactions  . Oxycodone-Acetaminophen Shortness Of Breath and Nausea Only  . Propoxyphene N-Acetaminophen Shortness Of Breath and Nausea Only  . Sulfonamide Derivatives Shortness Of Breath and Nausea Only  . Codeine Nausea Only  . Latex Rash  . Metoprolol Rash  . Morphine And Related Rash    IV site on arm is red, patient reports this is improving.  NO shortness of breath reported.    Family History  Problem Relation Age of Onset  . Heart attack Mother   . Heart disease Father   . Asthma Sister   . Asthma Sister   . Asthma Daughter   . Cancer Maternal Grandfather     prostate  . Cancer Paternal Grandfather     colon     Prior to Admission medications   Medication Sig Start Date End Date Taking? Authorizing Provider  alendronate (  FOSAMAX) 70 MG tablet Take 70 mg by mouth once a week. Take on Saturdays with a full glass of water on an empty stomach.   Yes Historical Provider, MD  calcitRIOL (ROCALTROL) 0.25 MCG capsule TAKE ONE CAPSULE BY MOUTH DAILY 02/11/15  Yes Renato Shin, MD  diltiazem (TIAZAC) 240 MG 24 hr capsule Take 1 capsule (240 mg total) by mouth daily. 10/04/14  Yes Renato Shin, MD  esomeprazole (NEXIUM) 20 MG capsule Take 1 capsule (20 mg total) by mouth daily before breakfast. 12/25/14  Yes Historical Provider, MD  famotidine (PEPCID) 20 MG tablet Take 1 tablet (20 mg total) by mouth 2 (two) times daily. 02/22/15  Yes  Belkys A Regalado, MD  losartan (COZAAR) 100 MG tablet Take 1 tablet (100 mg total) by mouth daily. 02/06/15  Yes Renato Shin, MD  mycophenolate (CELLCEPT) 250 MG capsule Take 250 mg by mouth 2 (two) times daily.   Yes Historical Provider, MD  ondansetron (ZOFRAN ODT) 4 MG disintegrating tablet 65m ODT q4 hours prn nausea/vomiting Patient taking differently: Take 4 mg by mouth every 8 (eight) hours as needed for nausea.  06/28/14  Yes SSherwood Gambler MD  [facility] [id number] Use to check blood sugar 1 time per day 02/12/15  Yes SRenato Shin MD  oxybutynin (DITROPAN) 5 MG tablet Take 5 mg by mouth 3 (three) times daily.   Yes Historical Provider, MD  predniSONE (DELTASONE) 5 MG tablet Take 5 mg by mouth daily with breakfast.   Yes Historical Provider, MD  sertraline (ZOLOFT) 100 MG tablet Take 1 tablet (100 mg total) by mouth daily. 10/04/14  Yes SRenato Shin MD  tacrolimus (PROGRAF) 1 MG capsule Take 1 mg by mouth 2 (two) times daily.  04/16/14  Yes Historical Provider, MD  warfarin (COUMADIN) 5 MG tablet Take 1 tablet (5 mg total) by mouth daily. 02/22/15  Yes Belkys A Regalado, MD  ammonium lactate (AMLACTIN) 12 % cream Apply topically as needed for dry skin.    Historical Provider, MD  Blood Glucose Monitoring Suppl (ONETOUCH VERIO IQ SYSTEM) W/DEVICE KIT Use to check blood sugar 1 time per day 02/12/15   SRenato Shin MD  glucose blood (ONE TOUCH ULTRA TEST) test strip Use to check blood sugar 1 time per day 02/12/15   SRenato Shin MD  nystatin (MYCOSTATIN) powder Apply 1 g topically 4 (four) times daily as needed. For yeast under breast    Historical Provider, MD  triamcinolone (KENALOG) 0.025 % cream Apply 1 application topically daily as needed (dry skin).     Historical Provider, MD  vancomycin (VANCOCIN) 50 mg/mL oral solution Take 2.5 mLs (125 mg total) by mouth every 6 (six) hours. 02/22/15   BElmarie Shiley MD   Physical Exam: Filed Vitals:   02/24/15 2135 02/25/15 0047  BP:  144/89 139/78  Pulse: 109 109  Temp:  98.6 F (37 C)  TempSrc:  Oral  Resp: 20   SpO2: 96% 96%    Wt Readings from Last 3 Encounters:  02/14/15 77.4 kg (170 lb 10.2 oz)  12/25/14 77.565 kg (171 lb)  11/13/14 84.2 kg (185 lb 10 oz)    General:  Appears calm and comfortable Eyes: PERRL, normal lids, irises & conjunctiva ENT: grossly normal hearing, lips & tongue Neck: no LAD, masses or thyromegaly Cardiovascular: RRR, no m/r/g. No LE edema. Telemetry: SR, no arrhythmias  Respiratory: CTA bilaterally, no w/r/r. Normal respiratory effort. Abdomen: soft, c/o tenderness in the middle of abdomen Skin: no  rash or induration seen on limited exam Musculoskeletal: grossly normal tone BUE/BLE Psychiatric: grossly normal mood and affect Neurologic: grossly non-focal.          Labs on Admission:  Basic Metabolic Panel:  Recent Labs Lab 02/18/15 0300  02/20/15 1335 02/21/15 0527 02/22/15 0430 02/22/15 0840 02/24/15 1956  NA 140  < > 138 142 141 142 139  K 3.5  < > 3.5 3.2* 5.0 3.7 3.1*  CL 107  < > 105 109 109 110 110  CO2 26  < > _0 20*  GLUCOSE 69  < > 159* 91 74 90 120*  BUN <5*  < > <5* <5* <5* <5* 12  CREATININE 0.51  < > 0.68 0.57 0.57 0.56 0.87  CALCIUM 8.2*  < > 9.0 8.9 8.6* 8.8* 9.2  MG 1.7  --   --   --   --   --   --   < > = values in this interval not displayed. Liver Function Tests:  Recent Labs Lab 02/24/15 1956  AST 22  ALT 14  ALKPHOS 40  BILITOT 1.0  PROT 6.4*  ALBUMIN 3.3*    Recent Labs Lab 02/24/15 1956  LIPASE 23   No results for input(s): AMMONIA in the last 168 hours. CBC:  Recent Labs Lab 02/18/15 0300 02/21/15 0527 02/24/15 1956  WBC 9.5 10.2 13.9*  NEUTROABS  --   --  7.3  HGB 12.0 11.8* 13.1  HCT 35.9* 36.2 39.3  MCV 90.0 89.8 90.6  PLT 145* 151 191   Cardiac Enzymes: No results for input(s): CKTOTAL, CKMB, CKMBINDEX, TROPONINI in the last 168 hours.  BNP (last 3 results)  Recent Labs  02/14/15 1955  BNP  53.0    ProBNP (last 3 results) No results for input(s): PROBNP in the last 8760 hours.  CBG:  Recent Labs Lab 02/21/15 1538 02/21/15 2032 02/22/15 0408 02/22/15 0810 02/22/15 1226  GLUCAP 236* 162* 99 111* 134*    Radiological Exams on Admission: Dg Abd 1 View  02/24/2015   CLINICAL DATA:  C diff, vomiting, rectal bleeding, mid abdominal pain  EXAM: ABDOMEN - 1 VIEW  COMPARISON:  CT abdomen pelvis dated 02/14/2015  FINDINGS: Nonobstructive bowel gas pattern.  Cholecystectomy clips.  Visualized osseous structures are within normal limits.  IMPRESSION: Unremarkable abdominal radiograph.   Electronically Signed   By: Julian Hy M.D.   On: 02/24/2015 22:32      Assessment/Plan Active Problems:   C. difficile colitis   Hypokalemia   CKD (chronic kidney disease)   1. C diff Colitis -patient is being admitted for po vanc. She was not able to get it from her pharmacy -will hydrate start on IVF  2. Hypokalemia -will replete as needed  3. CKD -labs are actually looking better -monitor labs  4. GERD -on PPI  5. Chronic Anticoagulation -continue coumadin  6. HTN On dilatiazem and cozaar  7. DM Type 2 -will monitor FSBS and provide SSI as needed -states she is on medications  8. H/o renal transplant -continue immunosuppressants -renal fx near baseline     Code Status: full code (must indicate code status--if unknown or must be presumed, indicate so) DVT Prophylaxis:on coumadin Family Communication: none (indicate person spoken with, if applicable, with phone number if by telephone) Disposition Plan: home (indicate anticipated LOS)   [facility] Pager [phone]

## 2015-02-25 NOTE — Telephone Encounter (Signed)
[facility] note dated 02-25-15 8:18 Patient Name: Connie Ruiz Gender: Female DOB: [birth year] Age: 54 Y 32 M 20 D Return Phone Number: [phone] (Primary) Address: City/State/Zip: [place] Client [facility] Night - Client Client Site [facility] Physician Renato Shin Contact Type Call Call Type Triage / Clinical Caller Name [name] Relationship To Patient Daughter Return Phone Number [phone] (Primary) Chief Complaint Rectal Bleeding Initial Comment Caller states mother has rectal bleeding. Was released from hospital on Saturday.

## 2015-02-25 NOTE — ED Provider Notes (Signed)
CSN: [id number]     Arrival date & time 02/24/15  1829 History   First MD Initiated Contact with Patient 02/24/15 1910     Chief Complaint  Patient presents with  . Abdominal Pain  . Rectal Bleeding     (Consider location/radiation/quality/duration/timing/severity/associated sxs/prior Treatment) HPI Comments: 54yo F w/ PMH including T2DM, HTN, CVA, CHF, C diff who p/w bloody diarrhea. Pt was discharged from the hospital 2 days ago after being treated for C diff. She was discharged with a prescription for PO vancomycin, which the pharmacy said they were out of and would have to get shipped in. Pt has been off medication for 2 days and has been developing worsening lower abdominal pain. She continues to have greater than 10 BMs per day and today they have noticed that they look bloody. No fevers, vomiting, chest pain, or shortness of breath. Pt is on coumadin. Family states she has had decreased PO intake.   The history is provided by the patient and a relative.    Past Medical History  Diagnosis Date  . THYROID NODULE, LEFT 04/10/2009  . HYPERLIPIDEMIA 08/21/2007  . GOUT 08/21/2007  . HYPERTENSION 08/21/2007    Dr. Andree Elk, [place] 08/21/2007  . CEREBROVASCULAR ACCIDENT, ACUTE 04/15/2010  . GERD 08/21/2007  . RENAL INSUFFICIENCY 08/21/2007  . LUPUS 08/21/2007  . OSTEOPOROSIS 08/21/2007    R[facility]umatol at [facility]  . DVT, HX OF 08/21/2007  . CLOSTRIDIUM DIFFICILE COLITIS, HX OF 08/21/2007  . KIDNEY TRANSPLANTATION, HX OF 08/22/2007    s/p renal transplant-[facility]. Andree Elk, [facility]  . Pulmonary embolism 07/16/2010  . Renal failure   . Current use of long term anticoagulation     Dr. Andree Elk, [facility]  . Depression     [facility]. Andree Elk, [facility]  . History of stroke with residual effects   . Right sided weakness   . CVA 04/17/2010  . Tachycardia   . DIABETES MELLITUS, TYPE II 08/21/2007  . Candida esophagitis 11/12/2014  . Steroid-induced hyperglycemia 11/09/2014   Past Surgical History   Procedure Laterality Date  . Cholecystectomy    . Tubal ligation    . Kidney transplant Right 2009  . Renal biopsy, open  1981  . Enteroscopy N/A 11/11/2014    Procedure: ENTEROSCOPY;  Surgeon: Ladene Artist, MD;  Location: [facility];  Service: Endoscopy;  Laterality: N/A;   Family History  Problem Relation Age of Onset  . Heart attack Mother   . Heart disease Father   . Asthma Sister   . Asthma Sister   . Asthma Daughter   . Cancer Maternal Grandfather     prostate  . Cancer Paternal Grandfather     colon   Social History  Substance Use Topics  . Smoking status: Never Smoker   . Smokeless tobacco: Never Used  . Alcohol Use: No   OB History    No data available     Review of Systems 10 Systems reviewed and are negative for acute change except as noted in the HPI.    Allergies  Oxycodone-acetaminophen; Propoxyphene n-acetaminophen; Sulfonamide derivatives; Codeine; Latex; Metoprolol; and Morphine and related  Home Medications   Prior to Admission medications   Medication Sig Start Date End Date Taking? Authorizing Provider  alendronate (FOSAMAX) 70 MG tablet Take 70 mg by mouth once a week. Take on Saturdays with a full glass of water on an empty stomach.   Yes Historical Provider, MD  calcitRIOL (ROCALTROL) 0.25 MCG capsule TAKE ONE CAPSULE  BY MOUTH DAILY 02/11/15  Yes Renato Shin, MD  diltiazem [facility]) 240 MG 24 hr capsule Take 1 capsule (240 mg total) by mouth daily. 10/04/14  Yes Renato Shin, MD  esomeprazole (NEXIUM) 20 MG capsule Take 1 capsule (20 mg total) by mouth daily before breakfast. 12/25/14  Yes Historical Provider, MD  famotidine (PEPCID) 20 MG tablet Take 1 tablet (20 mg total) by mouth 2 (two) times daily. 02/22/15  Yes Belkys A Regalado, MD  losartan (COZAAR) 100 MG tablet Take 1 tablet (100 mg total) by mouth daily. 02/06/15  Yes Renato Shin, MD  mycophenolate (CELLCEPT) 250 MG capsule Take 250 mg by mouth 2 (two) times daily.   Yes Historical  Provider, MD  ondansetron (ZOFRAN ODT) 4 MG disintegrating tablet 87m ODT q4 hours prn nausea/vomiting Patient taking differently: Take 4 mg by mouth every 8 (eight) hours as needed for nausea.  06/28/14  Yes SSherwood Gambler MD  [facility] [id number] Use to check blood sugar 1 time per day 02/12/15  Yes SRenato Shin MD  oxybutynin (DITROPAN) 5 MG tablet Take 5 mg by mouth 3 (three) times daily.   Yes Historical Provider, MD  predniSONE (DELTASONE) 5 MG tablet Take 5 mg by mouth daily with breakfast.   Yes Historical Provider, MD  sertraline (ZOLOFT) 100 MG tablet Take 1 tablet (100 mg total) by mouth daily. 10/04/14  Yes SRenato Shin MD  tacrolimus (PROGRAF) 1 MG capsule Take 1 mg by mouth 2 (two) times daily.  04/16/14  Yes Historical Provider, MD  warfarin (COUMADIN) 5 MG tablet Take 1 tablet (5 mg total) by mouth daily. 02/22/15  Yes Belkys A Regalado, MD  ammonium lactate (AMLACTIN) 12 % cream Apply topically as needed for dry skin.    Historical Provider, MD  Blood Glucose Monitoring Suppl (ONETOUCH VERIO IQ SYSTEM) W/DEVICE KIT Use to check blood sugar 1 time per day 02/12/15   SRenato Shin MD  glucose blood (ONE TOUCH ULTRA TEST) test strip Use to check blood sugar 1 time per day 02/12/15   SRenato Shin MD  nystatin (MYCOSTATIN) powder Apply 1 g topically 4 (four) times daily as needed. For yeast under breast    Historical Provider, MD  triamcinolone (KENALOG) 0.025 % cream Apply 1 application topically daily as needed (dry skin).     Historical Provider, MD  vancomycin (VANCOCIN) 50 mg/mL oral solution Take 2.5 mLs (125 mg total) by mouth every 6 (six) hours. 02/22/15   Belkys A Regalado, MD   BP 139/78 mmHg  Pulse 109  Temp(Src) 98.6 F (37 C) (Oral)  Resp 20  SpO2 96% Physical Exam  Constitutional: She appears well-developed and well-nourished.  Uncomfortable but NAD  HENT:  Head: Normocephalic and atraumatic.  Moist mucous membranes  Eyes: Conjunctivae are normal. Pupils  are equal, round, and reactive to light.  Neck: Neck supple.  Cardiovascular: Normal rate, regular rhythm and normal heart sounds.   No murmur heard. Pulmonary/Chest: Effort normal and breath sounds normal.  Abdominal: Soft. Bowel sounds are normal. She exhibits no distension.  Lower abdominal/suprapubic tenderness to palpation w/ no rebound or guarding, no peritonitis  Genitourinary:  Orange loose stool in diaper  Musculoskeletal: She exhibits no edema.  Neurological: She is alert.  Oriented to person, follows basic commands  Skin: Skin is warm and dry.  Psychiatric: She has a normal mood and affect.  Nursing note and vitals reviewed.   ED Course  Procedures (including critical care time) Labs Review Labs Reviewed  COMPREHENSIVE METABOLIC PANEL - Abnormal; Notable for the following:    Potassium 3.1 (*)    CO2 20 (*)    Glucose, Bld 120 (*)    Total Protein 6.4 (*)    Albumin 3.3 (*)    All other components within normal limits  CBC WITH DIFFERENTIAL/PLATELET - Abnormal; Notable for the following:    WBC 13.9 (*)    Lymphs Abs 5.2 (*)    Monocytes Absolute 1.3 (*)    All other components within normal limits  PROTIME-INR - Abnormal; Notable for the following:    Prothrombin Time 18.2 (*)    INR 1.50 (*)    All other components within normal limits  LIPASE, BLOOD  URINALYSIS, ROUTINE W REFLEX MICROSCOPIC (NOT AT [facility])  I-STAT CG4 LACTIC ACID, ED  POC OCCULT BLOOD, ED    Imaging Review Dg Abd 1 View  02/24/2015   CLINICAL DATA:  C diff, vomiting, rectal bleeding, mid abdominal pain  EXAM: ABDOMEN - 1 VIEW  COMPARISON:  CT abdomen pelvis dated 02/14/2015  FINDINGS: Nonobstructive bowel gas pattern.  Cholecystectomy clips.  Visualized osseous structures are within normal limits.  IMPRESSION: Unremarkable abdominal radiograph.   Electronically Signed   By: Julian Hy M.D.   On: 02/24/2015 22:32   I have personally reviewed and evaluated these lab results as part of  my medical decision-making.   EKG Interpretation None      MDM   Final diagnoses:  None  diarrhea Hypokalemia Recent C diff infection   54yo F on coumadin who p/w worsening abdominal pain and continued diarrhea after recent discharge from hospital for treatment of C diff. Pt uncomfortable but non-toxic in appearance. VS stable. Lower abdominal tenderness on exam without distention or peritonitis. Obtained KUB to evaluate for toxic megacolon and sent above labs including lactate. Gave pt dose of PO vancomycin as she has not had any since hospital discharge.  KUB reassuring. Labs show subtherapeutic INR 1.5, stable H/H, stable creatinine. K low at 3.1; gave oral and IV potassium repletion. Interestingly, despite stool that appears bloody, hemoccult was negative; it is unclear what has caused change in color of BMs. I am concerned about the patient's ongoing multiple episodes of diarrhea which have contributed to hypokalemia and her abdominal pain which has required several doses of IV narcotics in the ED. Given her multiple co-morbidities and ongoing symptoms, patient admitted to general medicine for further care.    Sharlett Iles, MD 02/26/15 [phone]

## 2015-02-26 ENCOUNTER — Telehealth: Payer: Self-pay

## 2015-02-26 DIAGNOSIS — F329 Major depressive disorder, single episode, unspecified: Secondary | ICD-10-CM

## 2015-02-26 DIAGNOSIS — E876 Hypokalemia: Secondary | ICD-10-CM

## 2015-02-26 DIAGNOSIS — A047 Enterocolitis due to Clostridium difficile: Secondary | ICD-10-CM | POA: Diagnosis not present

## 2015-02-26 DIAGNOSIS — N189 Chronic kidney disease, unspecified: Secondary | ICD-10-CM | POA: Diagnosis not present

## 2015-02-26 LAB — CBC
HCT: 34.3 % — ABNORMAL LOW (ref 36.0–46.0)
Hemoglobin: 10.9 g/dL — ABNORMAL LOW (ref 12.0–15.0)
MCH: 29.2 pg (ref 26.0–34.0)
MCHC: 31.8 g/dL (ref 30.0–36.0)
MCV: 92 fL (ref 78.0–100.0)
Platelets: 166 10*3/uL (ref 150–400)
RBC: 3.73 MIL/uL — ABNORMAL LOW (ref 3.87–5.11)
RDW: 14.6 % (ref 11.5–15.5)
WBC: 9.9 10*3/uL (ref 4.0–10.5)

## 2015-02-26 LAB — BASIC METABOLIC PANEL
Anion gap: 6 (ref 5–15)
BUN: 9 mg/dL (ref 6–20)
CO2: 23 mmol/L (ref 22–32)
Calcium: 8.7 mg/dL — ABNORMAL LOW (ref 8.9–10.3)
Chloride: 110 mmol/L (ref 101–111)
Creatinine, Ser: 0.57 mg/dL (ref 0.44–1.00)
GFR calc Af Amer: >60 mL/min (ref >60)
GFR calc non Af Amer: >60 mL/min (ref >60)
Glucose, Bld: 89 mg/dL (ref 65–99)
Potassium: 3.9 mmol/L (ref 3.5–5.1)
Sodium: 139 mmol/L (ref 135–145)

## 2015-02-26 LAB — PROTIME-INR
INR: 2.02 — ABNORMAL HIGH (ref 0.00–1.49)
Prothrombin Time: 22.8 seconds — ABNORMAL HIGH (ref 11.6–15.2)

## 2015-02-26 LAB — GLUCOSE, CAPILLARY: Glucose-Capillary: 85 mg/dL (ref 65–99)

## 2015-02-26 MED ORDER — ONDANSETRON HCL 4 MG/2ML IJ SOLN
4.0000 mg | Freq: Once | INTRAMUSCULAR | Status: AC
  Administered 2015-02-26: 4 mg via INTRAVENOUS

## 2015-02-26 MED ORDER — WARFARIN SODIUM 5 MG PO TABS
5.0000 mg | ORAL_TABLET | Freq: Once | ORAL | Status: AC
  Administered 2015-02-26: 5 mg via ORAL
  Filled 2015-02-26: qty 1

## 2015-02-26 NOTE — Progress Notes (Signed)
ANTICOAGULATION CONSULT NOTE - Follow Up Consult  Pharmacy Consult for Warfarin Indication: pulmonary embolus, DVT and CVA  Allergies  Allergen Reactions  . Oxycodone-Acetaminophen Shortness Of Breath and Nausea Only  . Propoxyphene N-Acetaminophen Shortness Of Breath and Nausea Only  . Sulfonamide Derivatives Shortness Of Breath and Nausea Only  . Codeine Nausea Only  . Latex Rash  . Metoprolol Rash  . Morphine And Related Rash    IV site on arm is red, patient reports this is improving.  NO shortness of breath reported.    Patient Measurements: Height: 5\' 4"  (162.6 cm) Weight: 159 lb 9.8 oz (72.4 kg) IBW/kg (Calculated) : 54.7  Vital Signs: Temp: 98.7 F (37.1 C) (09/21 0510) Temp Source: Oral (09/21 0510) BP: 130/87 mmHg (09/21 0510) Pulse Rate: 76 (09/21 0510)  Labs:  Recent Labs  02/24/15 1956 02/25/15 0440 02/26/15 0422  HGB 13.1 12.7 10.9*  HCT 39.3 40.3 34.3*  PLT 191 169 166  LABPROT 18.2* 18.9* 22.8*  INR 1.50* 1.58* 2.02*  CREATININE 0.87 0.75 0.57    Estimated Creatinine Clearance: 78.4 mL/min (by C-G formula based on Cr of 0.57).   Medications:  Scheduled:  . calcitRIOL  0.25 mcg Oral Daily  . diltiazem  240 mg Oral Daily  . famotidine  20 mg Oral BID  . Influenza vac split quadrivalent PF  0.5 mL Intramuscular Tomorrow-1000  . losartan  100 mg Oral Daily  . mycophenolate  250 mg Oral BID  . oxybutynin  5 mg Oral TID  . pantoprazole  40 mg Oral Daily  . predniSONE  5 mg Oral Q breakfast  . sertraline  100 mg Oral Daily  . tacrolimus  1 mg Oral BID  . vancomycin  125 mg Oral 4 times per day  . Warfarin - Pharmacist Dosing Inpatient   Does not apply q1800   Infusions:  . sodium chloride 50 mL/hr at 02/25/15 1044   PRN: ammonium lactate, HYDROmorphone (DILAUDID) injection, ondansetron (ZOFRAN) IV  Assessment: 2 yoF with PMHx right kidney transplant '09, lupus, PE / DVT on coumadin, CVA, CHF, and T2DM with recent admission for CDiff  colitis d/c'd with PO vancomycin however pt failed to fill prescription. Now presents with ongoing diarrhea and abdominal pain. Pharmacy consulted to resume coumadin dosing.    Home dose warfarin: 5mg  po daily.  INR subtherapeutic on admission so given boosted doses 9/20  INR therapeutic today (2.02)  Hgb slightly decreased (10.9), Plts 166  No bleeding reported  No drug-drug interactions  Tolerating HH diet  Goal of Therapy:  INR 2-3   Plan:   Warfarin 5mg  PO x 1 today at 18:00  Daily PT/INR while inpatient  Peggyann Juba, PharmD, BCPS Pager: [phone] 02/26/2015,7:46 AM

## 2015-02-26 NOTE — Progress Notes (Signed)
[facility] PROGRESS NOTE  Connie Ruiz [id number] DOB: 1961-03-16 DOA: 02/24/2015 PCP: Renato Shin, MD  HPI/Subjective: Connie Ruiz is a 54 y.o female who presents to the unit from the ED following an episode of continued abdominal pain and diarrhea. She was seen in the ED on 02/14/15, was diagnosed with C. Diff colitis, and sent home on a prescription of oral vancomycin. CT in ED showed evidence of inflammatory change in the colon and jejunum. According to the patient, her pharmacy did not have oral vancomycin and therefore she did not take it. Upon admission, she reported continued abdominal pain and diarrhea (4-5 BM/day, loose and watery). 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   Today she reports continued abdominal pain (10/10). She has had 2 BM today (loose, non-bloody). Reports nausea, but denies vomiting. Denies fever and chills.    Assessment/Plan: 1. C. Diff Colitis: Patient presenting with prior ED diagnosis of C. Diff colitis. Labs today were negative for C. Diff antigen and toxin (presumable false negative d/t current abx treatment). Currently afebrile with resolution of leukocytosis (9.9) no tachycardia. Patient initially severely tender on palpation and reports nausea. Will give PRN Zofran for nausea and Dilaudid for pain. She refuses to get up from bed due to pain. Will consult PT. Upon re-examination, patient is much more comfortable after pain medication and is able to eat without complaints. Per GI on prior admission, treatment with vancomycin needed x14 days (started on 02/18/15). Plan will be to continue Vancomycin for recommended course with pain/nausea management PRN.  2. CKD: Currently stable (BUN 11, SCr: .75). Plan is to continue to monitor.  3. Hypokalemia: Resolved (3.9) on last check 4. GERD: Patient has a history of GERD. Will discontinue PPI due to active C diff infection 5. Anticoagulation:  Chronic coumadin use for previous DVT.Currently at theapeutic goal. Pharmacy dosing 6. HTN: Patient has a hx of HTN managed at home with Losartan/Diltiazem. Currently stable at 130/87. Continue meds and monitor.  7. Chronic Diastolic CHF: 2-D echo in January 2015 indicated EF of 60-65%. Currently stable and asymptomatic. Continue to monitor.  8. DM2: Not managed on home medications. Plan is to continue to monitor and manage with sliding scale insulin PRN.  9. Depression: stable continue zoloft 10. Previous CVA: Prior CVA approximately 2 years ago. Aphasia symptoms still present. No other focal neurological deficits.  11. Renal transplant: Chronic immunosuppression. Renal function stable. [facility] team at [facility].  12. Goals of care: Patient currently lives alone in an apartment, but has a daughter that helps to manage her care. She has a chronic history of GI issues and most recent ED visit resulted in the need for an outpatient balloon enteroscopy as an outpatient for work up of inflammatory changes in colon and jejunum. Outpatient visit scheduled at [facility] on 03/02/16. Patient was previously unable to obtain outpatient medications because the pharmacy did not have the medication, thus contributing to a worsening of her symptoms. I believe that if we can help manage her pain and monitor her stools today, then she would be best suited to continue outpatient workup as scheduled at [facility]. Prior to discharge, we will need to transition her to oral pain medication. I have encouraged her prior to discharge to make sure that she is able to get her medication and take it is simply for continued improvement.  Code Status: Full Family Communication: None at bedside Disposition Plan: Most likely DC next a.m. DVT Prophylaxis:  On chronic Coumadin   Consultants:  None  Procedures:  None  Antibiotics:  See below  Anti-infectives    Start     Dose/Rate Route Frequency Ordered Stop   02/25/15  0200  vancomycin (VANCOCIN) 50 mg/mL oral solution 125 mg  Status:  Discontinued     125 mg Oral 4 times per day 02/25/15 0155 02/25/15 0209   02/25/15 0000  vancomycin (VANCOCIN) 50 mg/mL oral solution 125 mg     125 mg Oral 4 times per day 02/24/15 2047          Objective: Filed Vitals:   02/26/15 0510  BP: 130/87  Pulse: 76  Temp: 98.7 F (37.1 C)  Resp: 16    Intake/Output Summary (Last 24 hours) at 02/26/15 1623 Last data filed at 02/26/15 1115  Gross per 24 hour  Intake    480 ml  Output    500 ml  Net    -20 ml   Filed Weights   02/25/15 0150  Weight: 72.4 kg (159 lb 9.8 oz)    Exam:   General: Patient in no acute distress, laying in bed supine, alert and awake  Cardiovascular: RRR, no m/r/g. No peripheral edema noted.   Respiratory: CTA b/l. No wheeze or rhonchi.   Abdomen: Soft, nondistended, hypoactive bowel sounds, no guarding  Musculoskeletal: Age-appropriate muscular strength.   Neuro: Appropriate movement of UE/LE. No focal neurological deficits noted.   Psych: A&Ox3. Mood appropriate. 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   Data Reviewed: Basic Metabolic Panel:  Recent Labs Lab 02/22/15 0430 02/22/15 0840 02/24/15 1956 02/25/15 0440 02/26/15 0422  NA 141 142 139 139 139  K 5.0 3.7 3.1* 4.6 3.9  CL 109 110 110 111 110  CO2 28 28 20* 19* 23  GLUCOSE 74 90 120* 108* 89  BUN <5* <5* 12 11 9   CREATININE 0.57 0.56 0.87 0.75 0.57  CALCIUM 8.6* 8.8* 9.2 8.7* 8.7*   Liver Function Tests:  Recent Labs Lab 02/24/15 1956 02/25/15 0440  AST 22 24  ALT 14 14  ALKPHOS 40 37*  BILITOT 1.0 0.7  PROT 6.4* 6.0*  ALBUMIN 3.3* 3.2*    Recent Labs Lab 02/24/15 1956  LIPASE 23   No results for input(s): AMMONIA in the last 168 hours. CBC:  Recent Labs Lab 02/21/15 0527 02/24/15 1956 02/25/15 0440 02/26/15 0422  WBC 10.2 13.9* 13.2* 9.9  NEUTROABS  --  7.3  --   --   HGB 11.8* 13.1 12.7 10.9*  HCT 36.2 39.3 40.3 34.3*  MCV 89.8  90.6 92.4 92.0  PLT 151 191 169 166   Cardiac Enzymes: No results for input(s): CKTOTAL, CKMB, CKMBINDEX, TROPONINI in the last 168 hours. BNP (last 3 results)  Recent Labs  02/14/15 1955  BNP 53.0    ProBNP (last 3 results) No results for input(s): PROBNP in the last 8760 hours.  CBG:  Recent Labs Lab 02/21/15 2032 02/22/15 0408 02/22/15 0810 02/22/15 1226 02/26/15 0731  GLUCAP 162* 99 111* 134* 85    Recent Results (from the past 240 hour(s))  C difficile quick scan w PCR reflex     Status: None   Collection Time: 02/25/15  8:04 AM  Result Value Ref Range Status   C Diff antigen NEGATIVE NEGATIVE Final   C Diff toxin NEGATIVE NEGATIVE Final   C Diff interpretation Negative for toxigenic C. difficile  Final     Studies: Dg Abd 1 View  02/24/2015   CLINICAL  DATA:  C diff, vomiting, rectal bleeding, mid abdominal pain  EXAM: ABDOMEN - 1 VIEW  COMPARISON:  CT abdomen pelvis dated 02/14/2015  FINDINGS: Nonobstructive bowel gas pattern.  Cholecystectomy clips.  Visualized osseous structures are within normal limits.  IMPRESSION: Unremarkable abdominal radiograph.   Electronically Signed   By: Julian Hy M.D.   On: 02/24/2015 22:32    Scheduled Meds: . calcitRIOL  0.25 mcg Oral Daily  . diltiazem  240 mg Oral Daily  . famotidine  20 mg Oral BID  . losartan  100 mg Oral Daily  . mycophenolate  250 mg Oral BID  . oxybutynin  5 mg Oral TID  . pantoprazole  40 mg Oral Daily  . predniSONE  5 mg Oral Q breakfast  . sertraline  100 mg Oral Daily  . tacrolimus  1 mg Oral BID  . vancomycin  125 mg Oral 4 times per day  . warfarin  5 mg Oral ONCE-1800  . Warfarin - Pharmacist Dosing Inpatient   Does not apply q1800   Continuous Infusions: . sodium chloride 50 mL/hr at 02/26/15 1539    Active Problems:   C. difficile colitis   Hypokalemia   CKD (chronic kidney disease)    Time spent: 20 minutes    Velvet Bathe, MD  [facility] If 7PM-7AM,  please contact night-coverage at [email or web address], password [facility] 02/26/2015, 4:23 PM

## 2015-02-26 NOTE — Care Management Note (Signed)
Case Management Note  Patient Details  Name: Connie Ruiz MRN: [id number] Date of Birth: 1961-04-28  Subjective/Objective:    54 yo admitted with C diff Colitis                Action/Plan: From home  Expected Discharge Date:                  Expected Discharge Plan:  [place]  In-House Referral:     Discharge planning Services  CM Consult  Post Acute Care Choice:    Choice offered to:     DME Arranged:    DME Agency:     HH Arranged:  RN, PT, OT, Nurse's Aide [place] Agency:  [place]  Status of Service:  In process, will continue to follow  Medicare Important Message Given:    Date Medicare IM Given:    Medicare IM give by:    Date Additional Medicare IM Given:    Additional Medicare Important Message give by:     If discussed at [place] of Stay Meetings, dates discussed:    Additional Comments: Pt is active with [facility] for [facility] services and would like to have them again at DC. Will need MD order for resumption of PT/OT/RN at DC. I also asked pt why she was unable to obtain her vancomycin when she was discharged last admission. Pt states her pharmacy [facility] did not have it and would have to order it for her. Pt was instructed to go to another pharmacy if her pharmacy did not carry an antibiotic she needed. Pt encouraged to use [facility] in the [facility] if she needs to DC on oral Vancomycin again as they are a compounding pharmacy and always have Vancomycin available. Pt states she will do this. No other DC needs identified. Lynnell Catalan, RN 02/26/2015, 2:45 PM

## 2015-02-26 NOTE — Evaluation (Signed)
Physical Therapy Evaluation Patient Details Name: Connie Ruiz MRN: [id number] DOB: 1960/09/23 Today's Date: 02/26/2015   History of Present Illness  Patient is a 54 y/o female admitted with cdiff.   PMH positive for antiphospholipid syndrome, renal transplant, CVA with R residual deficits, DVT w/ PE, DM, lupus.  Clinical Impression  Pt admitted with above diagnosis. Pt currently with functional limitations due to the deficits listed below (see PT Problem List). Pt walked 40' with RW, distance limited by abdominal pain. Pt will benefit from skilled PT to increase their independence and safety with mobility to allow discharge to the venue listed below.       Follow Up Recommendations Home health PT    Equipment Recommendations  None recommended by PT    Recommendations for Other Services       Precautions / Restrictions Precautions Precautions: Fall Precaution Comments: pt denies falls at home in past year Restrictions Weight Bearing Restrictions: No      Mobility  Bed Mobility Overal bed mobility: Needs Assistance Bed Mobility: Supine to Sit;Sit to Supine     Supine to sit: Modified independent (Device/Increase time);HOB elevated Sit to supine: Min assist   General bed mobility comments: min A for BLEs into bed, HOB up 50*  Transfers Overall transfer level: Needs assistance Equipment used: Rolling walker (2 wheeled)   Sit to Stand: Supervision         General transfer comment: supervision for safety, no physical assist  Ambulation/Gait Ambulation/Gait assistance: Supervision Ambulation Distance (Feet): 40 Feet Assistive device: Rolling walker (2 wheeled) Gait Pattern/deviations: Step-through pattern;Decreased step length - right;Decreased step length - left Gait velocity: WFL   General Gait Details: steady with walking using RW, no LOB, distance limiited by abdominal pain  Stairs            Wheelchair Mobility    Modified Rankin (Stroke Patients  Only)       Balance   Sitting-balance support: Feet supported Sitting balance-Leahy Scale: Good       Standing balance-Leahy Scale: Fair                               Pertinent Vitals/Pain Pain Score: 8  Pain Location: abdomen Pain Descriptors / Indicators: Sore Pain Intervention(s): Limited activity within patient's tolerance;Monitored during session;Patient requesting pain meds-RN notified    Home Living Family/patient expects to be discharged to:: Private residence Living Arrangements: Alone Available Help at Discharge: Personal care attendant Type of Home: Apartment Home Access: Level entry     Home Layout: One level Home Equipment: [place] - 2 wheels;Cane - single point;Bedside commode;Wheelchair - manual;Grab bars - tub/shower;Grab bars - toilet;Wheelchair - power Additional Comments: aide comes 3-5 hrs 7d/wk    Prior Function Level of Independence: Needs assistance   Gait / Transfers Assistance Needed: amb with cane or RW, or uses WC  ADL's / Homemaking Assistance Needed: assist for bathing and occasionally for dressing/cooking, etc  Comments: occasional word finding difficulty/expressive aphasia     Hand Dominance        Extremity/Trunk Assessment   Upper Extremity Assessment: Generalized weakness           Lower Extremity Assessment: Generalized weakness (knee extension 4/5 bilaterally, pt reports tingling RLE since CVA 2 years ago, sensation intact to light touch RLE)      Cervical / Trunk Assessment: Normal  Communication   Communication: No difficulties  Cognition Arousal/Alertness: Awake/alert Behavior During Therapy:  WFL for tasks assessed/performed Overall Cognitive Status: Within Functional Limits for tasks assessed                      General Comments      Exercises        Assessment/Plan    PT Assessment Patient needs continued PT services  PT Diagnosis Acute pain;Generalized weakness   PT Problem  List Decreased strength;Decreased activity tolerance;Decreased balance;Decreased mobility  PT Treatment Interventions Gait training;Functional mobility training;Therapeutic exercise;Therapeutic activities   PT Goals (Current goals can be found in the Care Plan section) Acute Rehab PT Goals Patient Stated Goal: to go home PT Goal Formulation: With patient Time For Goal Achievement: 03/12/15 Potential to Achieve Goals: Good    Frequency Min 3X/week   Barriers to discharge        Co-evaluation               End of Session Equipment Utilized During Treatment: Gait belt Activity Tolerance: Patient tolerated treatment well Patient left: in bed;with call bell/phone within reach Nurse Communication: Mobility status    Functional Assessment Tool Used: clinical judgement Functional Limitation: Mobility: Walking and moving around Mobility: Walking and Moving Around Current Status [id number]): At least 40 percent but less than 60 percent impaired, limited or restricted Mobility: Walking and Moving Around Goal Status [phone]): At least 20 percent but less than 40 percent impaired, limited or restricted    Time: 0900-0915 PT Time Calculation (min) (ACUTE ONLY): 15 min   Charges:   PT Evaluation $Initial PT Evaluation Tier I: 1 Procedure     PT G Codes:   PT G-Codes **NOT FOR INPATIENT CLASS** Functional Assessment Tool Used: clinical judgement Functional Limitation: Mobility: Walking and moving around Mobility: Walking and Moving Around Current Status [id number]): At least 40 percent but less than 60 percent impaired, limited or restricted Mobility: Walking and Moving Around Goal Status [phone]): At least 20 percent but less than 40 percent impaired, limited or restricted    Philomena Doheny 02/26/2015, 9:25 AM [phone]

## 2015-02-26 NOTE — Progress Notes (Signed)
OT Cancellation Note  Patient Details Name: BEVERLEE VASICEK MRN: [id number] DOB: [birth year]   Cancelled Treatment:    Reason Eval/Treat Not Completed: Pain limiting ability to participate;Patient declined, no reason specified. Pt asking why she needs therapy session. Explained overall goals and benefits of occupational therapy and risks of not mobilizing. Pt reporting 8/10 pain in stomach. Offered to reattempt. OT to reattempt as schedule permits.  Hortencia Pilar 02/26/2015, 12:01 PM

## 2015-02-26 NOTE — Telephone Encounter (Signed)
Pt [place] orders faxed to [phone].

## 2015-02-26 NOTE — Progress Notes (Signed)
OT Cancellation Note  Patient Details Name: Connie Ruiz MRN: [id number] DOB: [birth year]   Cancelled Treatment:    Reason Eval/Treat Not Completed: Other (comment) (Pt sleeping and requests continuing to rest.) OT to reattempt.  Hortencia Pilar 02/26/2015, 1:22 PM

## 2015-02-27 ENCOUNTER — Telehealth: Payer: Self-pay | Admitting: *Deleted

## 2015-02-27 DIAGNOSIS — N189 Chronic kidney disease, unspecified: Secondary | ICD-10-CM | POA: Diagnosis not present

## 2015-02-27 DIAGNOSIS — E876 Hypokalemia: Secondary | ICD-10-CM | POA: Diagnosis not present

## 2015-02-27 DIAGNOSIS — M150 Primary generalized (osteo)arthritis: Secondary | ICD-10-CM | POA: Diagnosis not present

## 2015-02-27 DIAGNOSIS — A047 Enterocolitis due to Clostridium difficile: Secondary | ICD-10-CM | POA: Diagnosis not present

## 2015-02-27 LAB — PROTIME-INR
INR: 2.08 — ABNORMAL HIGH (ref 0.00–1.49)
Prothrombin Time: 23.3 seconds — ABNORMAL HIGH (ref 11.6–15.2)

## 2015-02-27 LAB — GLUCOSE, CAPILLARY: Glucose-Capillary: 83 mg/dL (ref 65–99)

## 2015-02-27 MED ORDER — TRAMADOL HCL 50 MG PO TABS: 50.0000 mg | ORAL_TABLET | Freq: Four times a day (QID) | ORAL | Status: DC | PRN

## 2015-02-27 MED ORDER — WARFARIN SODIUM 5 MG PO TABS: 5.0000 mg | ORAL_TABLET | Freq: Once | ORAL | Status: DC

## 2015-02-27 MED ORDER — VANCOMYCIN 50 MG/ML ORAL SOLUTION: 125.0000 mg | Freq: Four times a day (QID) | ORAL | Status: DC

## 2015-02-27 NOTE — Discharge Summary (Signed)
Physician Discharge Summary  LACRECIA DELVAL GLO:[id number] DOB: 07-04-1960 DOA: 02/24/2015  PCP: Renato Shin, MD  Admit date: 02/24/2015 Discharge date: 02/27/2015  Time spent: > 35 minutes  Recommendations for Outpatient Follow-up:  1. Pt will need to complete a 2 wk treatment regimen of oral vancomycin  Discharge Diagnoses:  Active Problems:   C. difficile colitis   Hypokalemia   CKD (chronic kidney disease)   Discharge Condition: stable  Diet recommendation: heart healthy/carb modified diet  Filed Weights   02/25/15 0150  Weight: 72.4 kg (159 lb 9.8 oz)    History of present illness:  Mean Vinal is a 54 y.o female who presents to the unit from the ED following an episode of continued abdominal pain and diarrhea. She was seen in the ED on 02/14/15, was diagnosed with C. Diff colitis, and sent home on a prescription of oral vancomycin. CT in ED showed evidence of inflammatory change in the colon and jejunum. According to the patient, her pharmacy did not have oral vancomycin and therefore she did not take it. Upon admission, she reported continued abdominal pain and diarrhea (4-5 BM/day, loose and watery). 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   Hospital Course:  1. C. Diff Colitis: Will discharge on oral vancomycin to complete a 2 week treatment course. Consulted my care manager to assist with obtaining medication prior to discharge. Reinforced importance of patient taking her antibiotic. 2. CKD: Currently stable (BUN 11, SCr: .75). Plan is to continue to monitor.  3. Hypokalemia: Resolved (3.9) on last check 4. GERD: Patient has a history of GERD. Will discontinue PPI due to active C diff infection 5. Anticoagulation: Chronic coumadin use for previous DVT.Currently at theapeutic goal. Pt to continue home regimen. 6. HTN: Patient has a hx of HTN managed at home with Losartan/Diltiazem. Currently stable will  continue at d/c 7. Chronic Diastolic CHF: 2-D echo in January 2015 indicated EF of 60-65%. Currently stable and asymptomatic. Continue to monitor.  8. DM2: Not managed on home medications. Plan is to continue to monitor and manage with sliding scale insulin PRN.  9. Depression: stable continue zoloft 10. Previous CVA: Prior CVA approximately 2 years ago. Aphasia symptoms still present. No other focal neurological deficits.  11. Renal transplant: Chronic immunosuppression. Renal function stable. [place] nephrology team at [facility].   Procedures:  None  Consultations:  None  Discharge Exam: Filed Vitals:   02/27/15 1035  BP: 140/75  Pulse: 75  Temp:   Resp:     General: Pt in nad, alert an awake Cardiovascular: no cyanosis, no upper extremity edema Respiratory: cta bl, no wheezes  Discharge Instructions   Discharge Instructions    Call MD for:  extreme fatigue    Complete by:  As directed      Call MD for:  redness, tenderness, or signs of infection (pain, swelling, redness, odor or green/yellow discharge around incision site)    Complete by:  As directed      Call MD for:  temperature >100.4    Complete by:  As directed      Diet - low sodium heart healthy    Complete by:  As directed      Increase activity slowly    Complete by:  As directed           Current Discharge Medication List    START taking these medications   Details  traMADol (ULTRAM) 50 MG tablet Take 1-2  tablets (50-100 mg total) by mouth every 6 (six) hours as needed for moderate pain. Qty: 30 tablet, Refills: 0      CONTINUE these medications which have CHANGED   Details  vancomycin (VANCOCIN) 50 mg/mL oral solution Take 2.5 mLs (125 mg total) by mouth every 6 (six) hours. Qty: 100 mL, Refills: 0      CONTINUE these medications which have NOT CHANGED   Details  alendronate (FOSAMAX) 70 MG tablet Take 70 mg by mouth once a week. Take on Saturdays with a full glass of water on an empty  stomach.    calcitRIOL (ROCALTROL) 0.25 MCG capsule TAKE ONE CAPSULE BY MOUTH DAILY Qty: 30 capsule, Refills: 0    diltiazem (TIAZAC) 240 MG 24 hr capsule Take 1 capsule (240 mg total) by mouth daily. Qty: 30 capsule, Refills: 11    losartan (COZAAR) 100 MG tablet Take 1 tablet (100 mg total) by mouth daily. Qty: 30 tablet, Refills: 3    mycophenolate (CELLCEPT) 250 MG capsule Take 250 mg by mouth 2 (two) times daily.    ondansetron (ZOFRAN ODT) 4 MG disintegrating tablet 76m ODT q4 hours prn nausea/vomiting Qty: 15 tablet, Refills: 0    ONETOUCH DELICA LANCETS 357DMISC Use to check blood sugar 1 time per day Qty: 100 each, Refills: 2    oxybutynin (DITROPAN) 5 MG tablet Take 5 mg by mouth 3 (three) times daily.    predniSONE (DELTASONE) 5 MG tablet Take 5 mg by mouth daily with breakfast.    sertraline (ZOLOFT) 100 MG tablet Take 1 tablet (100 mg total) by mouth daily. Qty: 30 tablet, Refills: 11    tacrolimus (PROGRAF) 1 MG capsule Take 1 mg by mouth 2 (two) times daily.     warfarin (COUMADIN) 5 MG tablet Take 1 tablet (5 mg total) by mouth daily. Qty: 40 tablet, Refills: 1    ammonium lactate (AMLACTIN) 12 % cream Apply topically as needed for dry skin.    Blood Glucose Monitoring Suppl (ONETOUCH VERIO IQ SYSTEM) W/DEVICE KIT Use to check blood sugar 1 time per day Qty: 1 kit, Refills: 0    glucose blood (ONE TOUCH ULTRA TEST) test strip Use to check blood sugar 1 time per day Qty: 100 each, Refills: 2    nystatin (MYCOSTATIN) powder Apply 1 g topically 4 (four) times daily as needed. For yeast under breast    triamcinolone (KENALOG) 0.025 % cream Apply 1 application topically daily as needed (dry skin).       STOP taking these medications     esomeprazole (NEXIUM) 20 MG capsule      famotidine (PEPCID) 20 MG tablet        Allergies  Allergen Reactions  . Oxycodone-Acetaminophen Shortness Of Breath and Nausea Only  . Propoxyphene N-Acetaminophen Shortness Of  Breath and Nausea Only  . Sulfonamide Derivatives Shortness Of Breath and Nausea Only  . Codeine Nausea Only  . Latex Rash  . Metoprolol Rash  . Morphine And Related Rash    IV site on arm is red, patient reports this is improving.  NO shortness of breath reported.   Follow-up Information    [facility]   Contact information:   [phone]       The results of significant diagnostics from this hospitalization (including imaging, microbiology, ancillary and laboratory) are listed below for reference.    Significant Diagnostic Studies: Dg Abd 1 View  02/24/2015   CLINICAL DATA:  C diff, vomiting, rectal bleeding, mid abdominal pain  EXAM: ABDOMEN - 1 VIEW  COMPARISON:  CT abdomen pelvis dated 02/14/2015  FINDINGS: Nonobstructive bowel gas pattern.  Cholecystectomy clips.  Visualized osseous structures are within normal limits.  IMPRESSION: Unremarkable abdominal radiograph.   Electronically Signed   By: Julian Hy M.D.   On: 02/24/2015 22:32   Ct Chest W Contrast  02/15/2015   CLINICAL DATA:  Left lung nodule.  EXAM: CT CHEST WITH CONTRAST  TECHNIQUE: Multidetector CT imaging of the chest was performed during intravenous contrast administration.  CONTRAST:  86m OMNIPAQUE IOHEXOL 300 MG/ML  SOLN  COMPARISON:  Chest radiograph of February 14, 2015.  FINDINGS: No pneumothorax or pleural effusion is noted. Minimal biapical scarring is noted. Mild nodular pleural thickening is noted in the right lung base with associated parenchymal densities most consistent with scarring. No discrete pulmonary nodule or mass is noted. No acute pulmonary disease is noted. Atherosclerosis of thoracic aorta is noted without aneurysm or dissection. Mild enlargement of pulmonary arteries are noted centrally suggesting pulmonary artery hypertension. Coronary artery calcifications are noted. No mediastinal mass or adenopathy is noted. 1.8  cm irregular low density is noted in left thyroid lobe. 2.5 cm mass or nodule arises anteriorly from the isthmus of the thyroid gland.  In the visualized portion of the upper abdomen, the patient is status post cholecystectomy. Bilateral renal atrophy is noted consistent with renal insufficiency.  IMPRESSION: No discrete pulmonary nodule or mass is noted. No mediastinal mass or adenopathy is noted.  Coronary artery calcifications are noted suggesting coronary artery disease.  2.5 cm solid nodule is seen arising anteriorly from the isthmus of the thyroid gland, with 1.8 cm irregular density seen in left thyroid lobe. Thyroid ultrasound is recommended for further evaluation.   Electronically Signed   By: JMarijo Conception M.D.   On: 02/15/2015 15:01   [place] Tissue Head/neck  02/17/2015   CLINICAL DATA:  Thyroid nodule with on recent CT chest  EXAM: THYROID ULTRASOUND  TECHNIQUE: Ultrasound examination of the thyroid gland and adjacent soft tissues was performed.  COMPARISON:  CT 02/15/2015 and earlier studies  FINDINGS: Right thyroid lobe  Measurements: 41 x 18 x 19 mm. 5 x 4 mm complex nodule, mid lobe. Smaller 3 mm hypoechoic nodule, inferior pole.  Left thyroid lobe  Measurements: 46 x 27 x 26 mm. Dominant 29 x 20 x 19 mm mostly solid nodule, mid lobe.  Isthmus  Thickness: 12 mm.  Solitary 25 x 17 x29 mm nodule, left of midline  Lymphadenopathy  None visualized.  IMPRESSION: 1. Normal-sized thyroid with multiple nodules. The dominant left and isthmic lesions meet consensus criteria for biopsy. Ultrasound-guided fine needle aspiration should be considered, as per the consensus statement: Management of Thyroid Nodules Detected at [place] [employer] Radi[employer] in [place] [facility] 2005; [id number]   Electronically Signed   By: DLucrezia EuropeM.D.   On: 02/17/2015 13:24   Ct Abdomen Pelvis W Contrast  02/14/2015   CLINICAL DATA:  Generalized weakness and abdominal pain. Nausea,  vomiting, and diarrhea over the last 2 days.  EXAM: CT ABDOMEN AND PELVIS WITH CONTRAST  TECHNIQUE: Multidetector CT imaging of the abdomen and pelvis was performed using the standard protocol following bolus administration of intravenous contrast.  CONTRAST:  211mOMNIPAQUE IOHEXOL 300 MG/ML SOLN, 10074mMNIPAQUE IOHEXOL 300 MG/ML SOLN  COMPARISON:  11/07/2014  FINDINGS: The lung bases are clear. Residual  contrast material in the lower esophagus may indicate reflux or dysmotility. Small esophageal hiatal hernia. Coronary artery calcifications.  Surgical absence of the gallbladder. Mild bile duct dilatation is likely normal for postcholecystectomy physiology. The liver, spleen, pancreas, adrenal glands, abdominal aorta, inferior vena cava, and retroperitoneal lymph nodes are unremarkable. Diffuse parenchymal atrophy involving both kidneys. No hydronephrosis. Stomach appears normal. Small bowel and colon are mostly decompressed. Suggestion of possible jejunal and colonic wall thickening although evaluation is difficult due to under distention. Mild colitis/enteritis not excluded. No free air or free fluid in the abdomen.  Pelvis: Pelvic transplant kidney demonstrates normal nephrogram. No hydronephrosis. Appendix is normal. Rectosigmoid colon is decompressed. Fluid in the rectum consistent with history of diarrhea and also possibly indicating colitis. Uterus and ovaries are not enlarged. The bladder is decompressed. There is gas in the bladder with possible bladder wall thickening. This may indicate cystitis or previous catheterization. Nodular infiltration in the subcutaneous fat over the lower abdominal wall may represent injection sites. No destructive bone lesions.  IMPRESSION: Small esophageal hiatal hernia with possible reflux or dysmotility shown in the distal esophagus. Atrophic native kidneys with normal appearing transplant kidney. Bowel is decompressed, limiting evaluation but there is suggestion of  inflammatory change in the colon and jejunum possibly indicating enteritis and colitis. Gas and wall thickening in the bladder suggesting cystitis versus previous instrumentation.   Electronically Signed   By: Lucienne Capers M.D.   On: 02/14/2015 00:44   Dg Chest Port 1 View  02/14/2015   CLINICAL DATA:  Evaluate central line placement  EXAM: PORTABLE CHEST - 1 VIEW  COMPARISON:  10/27/2012  FINDINGS: Left internal jugular central line identified with tip just above the cavoatrial junction. No pneumothorax.  Heart size and vascular pattern are normal. Irregular opacity measuring about 4 cm over the left upper lobe new from prior studies. 5 mm nodular opacity lateral inferior right lobe also new. All  IMPRESSION: 1.  Central line as described.  2. Consider CT thorax with contrast if possible, to evaluate new nodular density right lower lobe and new irregular 4cm left upper lobe opacity, which could represent a lung mass.   Electronically Signed   By: Skipper Cliche M.D.   On: 02/14/2015 18:02    Microbiology: Recent Results (from the past 240 hour(s))  C difficile quick scan w PCR reflex     Status: None   Collection Time: 02/25/15  8:04 AM  Result Value Ref Range Status   C Diff antigen NEGATIVE NEGATIVE Final   C Diff toxin NEGATIVE NEGATIVE Final   C Diff interpretation Negative for toxigenic C. difficile  Final     Labs: Basic Metabolic Panel:  Recent Labs Lab 02/22/15 0430 02/22/15 0840 02/24/15 1956 02/25/15 0440 02/26/15 0422  NA 141 142 139 139 139  K 5.0 3.7 3.1* 4.6 3.9  CL 109 110 110 111 110  CO2 28 28 20* 19* 23  GLUCOSE 74 90 120* 108* 89  BUN <5* <5* '12 11 9  ' CREATININE 0.57 0.56 0.87 0.75 0.57  CALCIUM 8.6* 8.8* 9.2 8.7* 8.7*   Liver Function Tests:  Recent Labs Lab 02/24/15 1956 02/25/15 0440  AST 22 24  ALT 14 14  ALKPHOS 40 37*  BILITOT 1.0 0.7  PROT 6.4* 6.0*  ALBUMIN 3.3* 3.2*    Recent Labs Lab 02/24/15 1956  LIPASE 23   No results for  input(s): AMMONIA in the last 168 hours. CBC:  Recent Labs Lab 02/21/15 0527 02/24/15  1956 02/25/15 0440 02/26/15 0422  WBC 10.2 13.9* 13.2* 9.9  NEUTROABS  --  7.3  --   --   HGB 11.8* 13.1 12.7 10.9*  HCT 36.2 39.3 40.3 34.3*  MCV 89.8 90.6 92.4 92.0  PLT 151 191 169 166   Cardiac Enzymes: No results for input(s): CKTOTAL, CKMB, CKMBINDEX, TROPONINI in the last 168 hours. BNP: BNP (last 3 results)  Recent Labs  02/14/15 1955  BNP 53.0    ProBNP (last 3 results) No results for input(s): PROBNP in the last 8760 hours.  CBG:  Recent Labs Lab 02/21/15 2032 02/22/15 0408 02/22/15 0810 02/22/15 1226 02/26/15 0731  GLUCAP 162* 99 111* 134* 85   Signed:  Velvet Bathe  [facility] 02/27/2015, 11:25 AM

## 2015-02-27 NOTE — Telephone Encounter (Signed)
Spoke with scheduling at [facility] and patient's appointment will need to be moved to Oct 12 at 10:00 AM.  Unable to reach daughter. Will try again later.

## 2015-02-27 NOTE — Progress Notes (Signed)
ANTICOAGULATION CONSULT NOTE - Follow Up Consult  Pharmacy Consult for Warfarin Indication: pulmonary embolus, DVT and CVA  Allergies  Allergen Reactions  . Oxycodone-Acetaminophen Shortness Of Breath and Nausea Only  . Propoxyphene N-Acetaminophen Shortness Of Breath and Nausea Only  . Sulfonamide Derivatives Shortness Of Breath and Nausea Only  . Codeine Nausea Only  . Latex Rash  . Metoprolol Rash  . Morphine And Related Rash    IV site on arm is red, patient reports this is improving.  NO shortness of breath reported.    Patient Measurements: Height: 5\' 4"  (162.6 cm) Weight: 159 lb 9.8 oz (72.4 kg) IBW/kg (Calculated) : 54.7  Vital Signs: Temp: 98.4 F (36.9 C) (09/22 0434) Temp Source: Oral (09/22 0434) BP: 153/65 mmHg (09/22 0434) Pulse Rate: 68 (09/22 0434)  Labs:  Recent Labs  02/24/15 1956 02/25/15 0440 02/26/15 0422 02/27/15 0450  HGB 13.1 12.7 10.9*  --   HCT 39.3 40.3 34.3*  --   PLT 191 169 166  --   LABPROT 18.2* 18.9* 22.8* 23.3*  INR 1.50* 1.58* 2.02* 2.08*  CREATININE 0.87 0.75 0.57  --     Estimated Creatinine Clearance: 78.4 mL/min (by C-G formula based on Cr of 0.57).   Medications:  Scheduled:  . calcitRIOL  0.25 mcg Oral Daily  . diltiazem  240 mg Oral Daily  . losartan  100 mg Oral Daily  . mycophenolate  250 mg Oral BID  . oxybutynin  5 mg Oral TID  . predniSONE  5 mg Oral Q breakfast  . sertraline  100 mg Oral Daily  . tacrolimus  1 mg Oral BID  . vancomycin  125 mg Oral 4 times per day  . Warfarin - Pharmacist Dosing Inpatient   Does not apply q1800   Infusions:  . sodium chloride 50 mL/hr at 02/26/15 1539   PRN: ammonium lactate, HYDROmorphone (DILAUDID) injection, ondansetron (ZOFRAN) IV  Assessment: 33 yoF with PMHx right kidney transplant '09, lupus, PE / DVT on coumadin, CVA, CHF, and T2DM with recent admission for CDiff colitis d/c'd with PO vancomycin however pt failed to fill prescription. Now presents with ongoing  diarrhea and abdominal pain. Pharmacy consulted to resume coumadin dosing.    Home dose warfarin: 5mg  po daily.  INR subtherapeutic on admission so given boosted doses 9/20  INR continues to be therapeutic  Hgb slightly decreased (10.9), Plts 166  No bleeding reported  No drug-drug interactions  Tolerating HH diet  Goal of Therapy:  INR 2-3   Plan:  1) Continue with home dose of warfarin 5mg  this PM 2) If discharged, also recommend just to continue home regimen of 5mg  daily as prior to admission 3) Daily INR   Adrian Saran, PharmD, BCPS Pager [phone] 02/27/2015 9:57 AM

## 2015-02-27 NOTE — Evaluation (Addendum)
Occupational Therapy Evaluation Patient Details Name: Connie Ruiz MRN: [id number] DOB: 1961-05-29 Today's Date: 02/27/2015    History of Present Illness Patient is a 54 y/o female admitted with cdiff.   PMH positive for antiphospholipid syndrome, renal transplant, CVA with R residual deficits, DVT w/ PE, DM, lupus.   Clinical Impression   Pt was admitted for the above. She reports that she is can sometimes do ADL and sometimes cannot.  She seems to have a home health aide.  Will follow in acute with supervision level goals. Will focus on energy conservation also    Follow Up Recommendations  Home health OT (initial 24/7)    Equipment Recommendations  None recommended by OT    Recommendations for Other Services       Precautions / Restrictions Precautions Precautions: Fall Restrictions Weight Bearing Restrictions: No      Mobility Bed Mobility           Sit to supine: Min assist   General bed mobility comments: assist for RLE  Transfers   Equipment used:  (iv pole)   Sit to Stand: Supervision         General transfer comment: for safety    Balance                                            ADL Overall ADL's : Needs assistance/impaired     Grooming: Wash/dry hands;Supervision/safety;Standing                   Toilet Transfer: Min guard;Ambulation;BSC   Toileting- Clothing Manipulation and Hygiene: Sit to/from stand;Supervision/safety (stood at sink.  Used R to hold gown, L for soap on cloth and)         General ADL Comments: pt fatiqued after using bathroom; requested to lie down. Did not assess all ADLs.  Pt states  she sometimes has difficulty with donning socks.  Pt had difficulty expressing herself--she said she had an aide; unsure of frequency     Vision     Perception     Praxis      Pertinent Vitals/Pain Pain Assessment: Faces Faces Pain Scale: No hurt     Hand Dominance Left (uses R as assist)    Extremity/Trunk Assessment Upper Extremity Assessment Upper Extremity Assessment: RUE deficits/detail RUE Deficits / Details: able to use hand--pt states she has had weakness since stroke a couple of years ago           Communication Communication Communication: Expressive difficulties   Cognition Arousal/Alertness: Awake/alert Behavior During Therapy: WFL for tasks assessed/performed Overall Cognitive Status: Within Functional Limits for tasks assessed                     General Comments       Exercises       Shoulder Instructions      Home Living Family/patient expects to be discharged to:: Private residence Living Arrangements: Alone                           Home Equipment: Gilford Rile - 2 wheels;Cane - single point;Bedside commode;Wheelchair - manual;Grab bars - tub/shower;Grab bars - toilet;Wheelchair - power          Prior Functioning/Environment Level of Independence: Needs assistance        Comments: home health aide;  sometimes didn't don socks    OT Diagnosis: Generalized weakness   OT Problem List: Decreased strength;Decreased activity tolerance;Decreased knowledge of use of DME or AE   OT Treatment/Interventions: Self-care/ADL training;DME and/or AE instruction;Patient/family education;energy conservation   OT Goals(Current goals can be found in the care plan section) Acute Rehab OT Goals Patient Stated Goal: none stated OT Goal Formulation: With patient Time For Goal Achievement: 03/06/15 Potential to Achieve Goals: Good ADL Goals Pt Will Transfer to Toilet: with supervision;ambulating;bedside commode Additional ADL Goal #1: pt will complete adl with set up/supervision, sit to stand  OT Frequency: Min 2X/week   Barriers to D/C:            Co-evaluation              End of Session Nurse Communication:  (? bed alarm.  Not on when I arrived)  Activity Tolerance: Patient limited by fatigue Patient left: in bed;with  call bell/phone within reach   Time: 1127-1137 OT Time Calculation (min): 10 min Charges:  OT General Charges $OT Visit: 1 Procedure OT Evaluation $Initial OT Evaluation Tier I: 1 Procedure G-Codes:    SPENCER,MARYELLEN 03-21-2015, 12:43 PM Lesle Chris, OTR/L [phone] 03/21/2015

## 2015-02-27 NOTE — Progress Notes (Signed)
This CM called [facility] outpt pharmacy to inquire whether they have the oral vancomycin solution. They do have it and all they need is pt's medicaid card and the prescription to fill it. This CM reinforced importance of getting Vancomycin and told pt exactly how to get to [place]. I also called pt's daughter to inform her where to pick up the medication. Both pt and daughter have committed to getting pt's vancomycin as soon as pt is discharged. Arville Go alerted that pt to DC home today and resumption orders have been written by MD. No other DC needs noted. Marney Doctor RN,BSN,NCM  [phone]

## 2015-02-28 DIAGNOSIS — M150 Primary generalized (osteo)arthritis: Secondary | ICD-10-CM | POA: Diagnosis not present

## 2015-02-28 NOTE — Telephone Encounter (Signed)
Unable to reach patient will try again later 

## 2015-03-01 DIAGNOSIS — M150 Primary generalized (osteo)arthritis: Secondary | ICD-10-CM | POA: Diagnosis not present

## 2015-03-02 DIAGNOSIS — N189 Chronic kidney disease, unspecified: Secondary | ICD-10-CM | POA: Diagnosis not present

## 2015-03-02 DIAGNOSIS — I5032 Chronic diastolic (congestive) heart failure: Secondary | ICD-10-CM | POA: Diagnosis not present

## 2015-03-02 DIAGNOSIS — E1122 Type 2 diabetes mellitus with diabetic chronic kidney disease: Secondary | ICD-10-CM | POA: Diagnosis not present

## 2015-03-02 DIAGNOSIS — I69351 Hemiplegia and hemiparesis following cerebral infarction affecting right dominant side: Secondary | ICD-10-CM | POA: Diagnosis not present

## 2015-03-02 DIAGNOSIS — Z86718 Personal history of other venous thrombosis and embolism: Secondary | ICD-10-CM | POA: Diagnosis not present

## 2015-03-02 DIAGNOSIS — Z7901 Long term (current) use of anticoagulants: Secondary | ICD-10-CM | POA: Diagnosis not present

## 2015-03-02 DIAGNOSIS — I129 Hypertensive chronic kidney disease with stage 1 through stage 4 chronic kidney disease, or unspecified chronic kidney disease: Secondary | ICD-10-CM | POA: Diagnosis not present

## 2015-03-02 DIAGNOSIS — M329 Systemic lupus erythematosus, unspecified: Secondary | ICD-10-CM | POA: Diagnosis not present

## 2015-03-02 DIAGNOSIS — F329 Major depressive disorder, single episode, unspecified: Secondary | ICD-10-CM | POA: Diagnosis not present

## 2015-03-02 DIAGNOSIS — Z5181 Encounter for therapeutic drug level monitoring: Secondary | ICD-10-CM | POA: Diagnosis not present

## 2015-03-02 DIAGNOSIS — M150 Primary generalized (osteo)arthritis: Secondary | ICD-10-CM | POA: Diagnosis not present

## 2015-03-02 DIAGNOSIS — Z792 Long term (current) use of antibiotics: Secondary | ICD-10-CM | POA: Diagnosis not present

## 2015-03-02 DIAGNOSIS — Z7952 Long term (current) use of systemic steroids: Secondary | ICD-10-CM | POA: Diagnosis not present

## 2015-03-02 DIAGNOSIS — A047 Enterocolitis due to Clostridium difficile: Secondary | ICD-10-CM | POA: Diagnosis not present

## 2015-03-03 DIAGNOSIS — Z7901 Long term (current) use of anticoagulants: Secondary | ICD-10-CM | POA: Diagnosis not present

## 2015-03-03 DIAGNOSIS — I129 Hypertensive chronic kidney disease with stage 1 through stage 4 chronic kidney disease, or unspecified chronic kidney disease: Secondary | ICD-10-CM | POA: Diagnosis not present

## 2015-03-03 DIAGNOSIS — A047 Enterocolitis due to Clostridium difficile: Secondary | ICD-10-CM | POA: Diagnosis not present

## 2015-03-03 DIAGNOSIS — I5032 Chronic diastolic (congestive) heart failure: Secondary | ICD-10-CM | POA: Diagnosis not present

## 2015-03-03 DIAGNOSIS — M150 Primary generalized (osteo)arthritis: Secondary | ICD-10-CM | POA: Diagnosis not present

## 2015-03-03 DIAGNOSIS — F329 Major depressive disorder, single episode, unspecified: Secondary | ICD-10-CM | POA: Diagnosis not present

## 2015-03-03 DIAGNOSIS — E1122 Type 2 diabetes mellitus with diabetic chronic kidney disease: Secondary | ICD-10-CM | POA: Diagnosis not present

## 2015-03-03 DIAGNOSIS — Z86718 Personal history of other venous thrombosis and embolism: Secondary | ICD-10-CM | POA: Diagnosis not present

## 2015-03-03 DIAGNOSIS — Z7952 Long term (current) use of systemic steroids: Secondary | ICD-10-CM | POA: Diagnosis not present

## 2015-03-03 DIAGNOSIS — Z5181 Encounter for therapeutic drug level monitoring: Secondary | ICD-10-CM | POA: Diagnosis not present

## 2015-03-03 DIAGNOSIS — M329 Systemic lupus erythematosus, unspecified: Secondary | ICD-10-CM | POA: Diagnosis not present

## 2015-03-03 DIAGNOSIS — N189 Chronic kidney disease, unspecified: Secondary | ICD-10-CM | POA: Diagnosis not present

## 2015-03-03 DIAGNOSIS — Z792 Long term (current) use of antibiotics: Secondary | ICD-10-CM | POA: Diagnosis not present

## 2015-03-03 DIAGNOSIS — I69351 Hemiplegia and hemiparesis following cerebral infarction affecting right dominant side: Secondary | ICD-10-CM | POA: Diagnosis not present

## 2015-03-03 NOTE — Telephone Encounter (Signed)
Unable to reach patient or daughter. Will try again later.

## 2015-03-04 DIAGNOSIS — M150 Primary generalized (osteo)arthritis: Secondary | ICD-10-CM | POA: Diagnosis not present

## 2015-03-04 NOTE — Progress Notes (Signed)
   02/27/15 1200  OT Time Calculation  OT Start Time (ACUTE ONLY) 1127  OT Stop Time (ACUTE ONLY) 1137  OT Time Calculation (min) 10 min  OT G-codes **NOT FOR INPATIENT CLASS**  Functional Assessment Tool Used clinical observation and judgment  Functional Limitation Self care  Self Care Current Status [id number]) CI  Self Care Goal Status [id number]) CI  OT General Charges  $OT Visit 1 Procedure  OT Evaluation  $Initial OT Evaluation Tier I 1 Procedure  Lesle Chris, OTR/L [phone] 03/04/2015

## 2015-03-04 NOTE — Telephone Encounter (Signed)
Left a message for patient or daughter to call back.

## 2015-03-05 ENCOUNTER — Ambulatory Visit (INDEPENDENT_AMBULATORY_CARE_PROVIDER_SITE_OTHER): Payer: Self-pay | Admitting: Cardiovascular Disease

## 2015-03-05 DIAGNOSIS — I69351 Hemiplegia and hemiparesis following cerebral infarction affecting right dominant side: Secondary | ICD-10-CM | POA: Diagnosis not present

## 2015-03-05 DIAGNOSIS — A047 Enterocolitis due to Clostridium difficile: Secondary | ICD-10-CM | POA: Diagnosis not present

## 2015-03-05 DIAGNOSIS — Z5181 Encounter for therapeutic drug level monitoring: Secondary | ICD-10-CM | POA: Diagnosis not present

## 2015-03-05 DIAGNOSIS — F329 Major depressive disorder, single episode, unspecified: Secondary | ICD-10-CM | POA: Diagnosis not present

## 2015-03-05 DIAGNOSIS — I129 Hypertensive chronic kidney disease with stage 1 through stage 4 chronic kidney disease, or unspecified chronic kidney disease: Secondary | ICD-10-CM | POA: Diagnosis not present

## 2015-03-05 DIAGNOSIS — Z86718 Personal history of other venous thrombosis and embolism: Secondary | ICD-10-CM | POA: Diagnosis not present

## 2015-03-05 DIAGNOSIS — I6350 Cerebral infarction due to unspecified occlusion or stenosis of unspecified cerebral artery: Secondary | ICD-10-CM

## 2015-03-05 DIAGNOSIS — I5032 Chronic diastolic (congestive) heart failure: Secondary | ICD-10-CM | POA: Diagnosis not present

## 2015-03-05 DIAGNOSIS — M150 Primary generalized (osteo)arthritis: Secondary | ICD-10-CM | POA: Diagnosis not present

## 2015-03-05 DIAGNOSIS — M329 Systemic lupus erythematosus, unspecified: Secondary | ICD-10-CM | POA: Diagnosis not present

## 2015-03-05 DIAGNOSIS — I639 Cerebral infarction, unspecified: Secondary | ICD-10-CM

## 2015-03-05 DIAGNOSIS — Z7901 Long term (current) use of anticoagulants: Secondary | ICD-10-CM | POA: Diagnosis not present

## 2015-03-05 DIAGNOSIS — N189 Chronic kidney disease, unspecified: Secondary | ICD-10-CM | POA: Diagnosis not present

## 2015-03-05 DIAGNOSIS — Z7952 Long term (current) use of systemic steroids: Secondary | ICD-10-CM | POA: Diagnosis not present

## 2015-03-05 DIAGNOSIS — Z792 Long term (current) use of antibiotics: Secondary | ICD-10-CM | POA: Diagnosis not present

## 2015-03-05 DIAGNOSIS — I2699 Other pulmonary embolism without acute cor pulmonale: Secondary | ICD-10-CM

## 2015-03-05 DIAGNOSIS — E1122 Type 2 diabetes mellitus with diabetic chronic kidney disease: Secondary | ICD-10-CM | POA: Diagnosis not present

## 2015-03-05 LAB — POCT INR: INR: 2.5

## 2015-03-06 DIAGNOSIS — M150 Primary generalized (osteo)arthritis: Secondary | ICD-10-CM | POA: Diagnosis not present

## 2015-03-06 NOTE — Telephone Encounter (Signed)
Patient's daughter notified of new appointment date and time.

## 2015-03-06 NOTE — Telephone Encounter (Signed)
Spoke with [facility] and patient is now scheduled with Dr. Durene Romans. Left a message for patient's daughter to call me.

## 2015-03-07 DIAGNOSIS — M150 Primary generalized (osteo)arthritis: Secondary | ICD-10-CM | POA: Diagnosis not present

## 2015-03-07 NOTE — Telephone Encounter (Signed)
ok 

## 2015-03-07 NOTE — Telephone Encounter (Signed)
Costella Hatcher occupational therapist from [facility] called he need orders 1 x a week for 4 weeks [phone]

## 2015-03-07 NOTE — Telephone Encounter (Signed)
Left voicemail advising verbal ok was given by Doctor Loanne Drilling.

## 2015-03-07 NOTE — Telephone Encounter (Signed)
See note below and please advise, Thanks! 

## 2015-03-08 DIAGNOSIS — M150 Primary generalized (osteo)arthritis: Secondary | ICD-10-CM | POA: Diagnosis not present

## 2015-03-09 DIAGNOSIS — M150 Primary generalized (osteo)arthritis: Secondary | ICD-10-CM | POA: Diagnosis not present

## 2015-03-10 DIAGNOSIS — I5032 Chronic diastolic (congestive) heart failure: Secondary | ICD-10-CM | POA: Diagnosis not present

## 2015-03-10 DIAGNOSIS — M329 Systemic lupus erythematosus, unspecified: Secondary | ICD-10-CM | POA: Diagnosis not present

## 2015-03-10 DIAGNOSIS — F329 Major depressive disorder, single episode, unspecified: Secondary | ICD-10-CM | POA: Diagnosis not present

## 2015-03-10 DIAGNOSIS — M150 Primary generalized (osteo)arthritis: Secondary | ICD-10-CM | POA: Diagnosis not present

## 2015-03-10 DIAGNOSIS — E1122 Type 2 diabetes mellitus with diabetic chronic kidney disease: Secondary | ICD-10-CM | POA: Diagnosis not present

## 2015-03-10 DIAGNOSIS — I69351 Hemiplegia and hemiparesis following cerebral infarction affecting right dominant side: Secondary | ICD-10-CM | POA: Diagnosis not present

## 2015-03-10 DIAGNOSIS — N189 Chronic kidney disease, unspecified: Secondary | ICD-10-CM | POA: Diagnosis not present

## 2015-03-10 DIAGNOSIS — Z7952 Long term (current) use of systemic steroids: Secondary | ICD-10-CM | POA: Diagnosis not present

## 2015-03-10 DIAGNOSIS — I129 Hypertensive chronic kidney disease with stage 1 through stage 4 chronic kidney disease, or unspecified chronic kidney disease: Secondary | ICD-10-CM | POA: Diagnosis not present

## 2015-03-10 DIAGNOSIS — Z5181 Encounter for therapeutic drug level monitoring: Secondary | ICD-10-CM | POA: Diagnosis not present

## 2015-03-10 DIAGNOSIS — Z792 Long term (current) use of antibiotics: Secondary | ICD-10-CM | POA: Diagnosis not present

## 2015-03-10 DIAGNOSIS — A047 Enterocolitis due to Clostridium difficile: Secondary | ICD-10-CM | POA: Diagnosis not present

## 2015-03-10 DIAGNOSIS — Z86718 Personal history of other venous thrombosis and embolism: Secondary | ICD-10-CM | POA: Diagnosis not present

## 2015-03-10 DIAGNOSIS — Z7901 Long term (current) use of anticoagulants: Secondary | ICD-10-CM | POA: Diagnosis not present

## 2015-03-10 NOTE — Telephone Encounter (Signed)
Patient's daughter given name on MD her mother will have OV with.

## 2015-03-11 DIAGNOSIS — I69351 Hemiplegia and hemiparesis following cerebral infarction affecting right dominant side: Secondary | ICD-10-CM | POA: Diagnosis not present

## 2015-03-11 DIAGNOSIS — I5032 Chronic diastolic (congestive) heart failure: Secondary | ICD-10-CM | POA: Diagnosis not present

## 2015-03-11 DIAGNOSIS — E1122 Type 2 diabetes mellitus with diabetic chronic kidney disease: Secondary | ICD-10-CM | POA: Diagnosis not present

## 2015-03-11 DIAGNOSIS — M150 Primary generalized (osteo)arthritis: Secondary | ICD-10-CM | POA: Diagnosis not present

## 2015-03-11 DIAGNOSIS — N189 Chronic kidney disease, unspecified: Secondary | ICD-10-CM | POA: Diagnosis not present

## 2015-03-11 DIAGNOSIS — Z7901 Long term (current) use of anticoagulants: Secondary | ICD-10-CM | POA: Diagnosis not present

## 2015-03-11 DIAGNOSIS — F329 Major depressive disorder, single episode, unspecified: Secondary | ICD-10-CM | POA: Diagnosis not present

## 2015-03-11 DIAGNOSIS — Z792 Long term (current) use of antibiotics: Secondary | ICD-10-CM | POA: Diagnosis not present

## 2015-03-11 DIAGNOSIS — A047 Enterocolitis due to Clostridium difficile: Secondary | ICD-10-CM | POA: Diagnosis not present

## 2015-03-11 DIAGNOSIS — Z5181 Encounter for therapeutic drug level monitoring: Secondary | ICD-10-CM | POA: Diagnosis not present

## 2015-03-11 DIAGNOSIS — M329 Systemic lupus erythematosus, unspecified: Secondary | ICD-10-CM | POA: Diagnosis not present

## 2015-03-11 DIAGNOSIS — I129 Hypertensive chronic kidney disease with stage 1 through stage 4 chronic kidney disease, or unspecified chronic kidney disease: Secondary | ICD-10-CM | POA: Diagnosis not present

## 2015-03-11 DIAGNOSIS — Z86718 Personal history of other venous thrombosis and embolism: Secondary | ICD-10-CM | POA: Diagnosis not present

## 2015-03-11 DIAGNOSIS — Z7952 Long term (current) use of systemic steroids: Secondary | ICD-10-CM | POA: Diagnosis not present

## 2015-03-12 ENCOUNTER — Ambulatory Visit (INDEPENDENT_AMBULATORY_CARE_PROVIDER_SITE_OTHER): Payer: Self-pay | Admitting: Internal Medicine

## 2015-03-12 ENCOUNTER — Telehealth: Payer: Self-pay | Admitting: Endocrinology

## 2015-03-12 DIAGNOSIS — I129 Hypertensive chronic kidney disease with stage 1 through stage 4 chronic kidney disease, or unspecified chronic kidney disease: Secondary | ICD-10-CM | POA: Diagnosis not present

## 2015-03-12 DIAGNOSIS — Z5181 Encounter for therapeutic drug level monitoring: Secondary | ICD-10-CM | POA: Diagnosis not present

## 2015-03-12 DIAGNOSIS — Z792 Long term (current) use of antibiotics: Secondary | ICD-10-CM | POA: Diagnosis not present

## 2015-03-12 DIAGNOSIS — M150 Primary generalized (osteo)arthritis: Secondary | ICD-10-CM | POA: Diagnosis not present

## 2015-03-12 DIAGNOSIS — I5032 Chronic diastolic (congestive) heart failure: Secondary | ICD-10-CM | POA: Diagnosis not present

## 2015-03-12 DIAGNOSIS — Z7901 Long term (current) use of anticoagulants: Secondary | ICD-10-CM | POA: Diagnosis not present

## 2015-03-12 DIAGNOSIS — A047 Enterocolitis due to Clostridium difficile: Secondary | ICD-10-CM | POA: Diagnosis not present

## 2015-03-12 DIAGNOSIS — I69351 Hemiplegia and hemiparesis following cerebral infarction affecting right dominant side: Secondary | ICD-10-CM | POA: Diagnosis not present

## 2015-03-12 DIAGNOSIS — E1122 Type 2 diabetes mellitus with diabetic chronic kidney disease: Secondary | ICD-10-CM | POA: Diagnosis not present

## 2015-03-12 DIAGNOSIS — I6350 Cerebral infarction due to unspecified occlusion or stenosis of unspecified cerebral artery: Secondary | ICD-10-CM

## 2015-03-12 DIAGNOSIS — Z86718 Personal history of other venous thrombosis and embolism: Secondary | ICD-10-CM | POA: Diagnosis not present

## 2015-03-12 DIAGNOSIS — Z7952 Long term (current) use of systemic steroids: Secondary | ICD-10-CM | POA: Diagnosis not present

## 2015-03-12 DIAGNOSIS — M329 Systemic lupus erythematosus, unspecified: Secondary | ICD-10-CM | POA: Diagnosis not present

## 2015-03-12 DIAGNOSIS — F329 Major depressive disorder, single episode, unspecified: Secondary | ICD-10-CM | POA: Diagnosis not present

## 2015-03-12 DIAGNOSIS — N189 Chronic kidney disease, unspecified: Secondary | ICD-10-CM | POA: Diagnosis not present

## 2015-03-12 LAB — POCT INR: INR: 4.1

## 2015-03-12 NOTE — Telephone Encounter (Signed)
Roland from [facility] calling for verbal ok for him to see pt at home 2 times a week for 4 wks # [phone]

## 2015-03-12 NOTE — Telephone Encounter (Signed)
See note below and please advise, Thanks! 

## 2015-03-12 NOTE — Telephone Encounter (Signed)
Left voicemail advising of note below. Requested call back from the pt to schedule office visit.

## 2015-03-12 NOTE — Telephone Encounter (Signed)
Please move up ov to this week.  We'll address then

## 2015-03-13 ENCOUNTER — Ambulatory Visit (INDEPENDENT_AMBULATORY_CARE_PROVIDER_SITE_OTHER): Payer: Medicare Other | Admitting: Endocrinology

## 2015-03-13 ENCOUNTER — Telehealth: Payer: Self-pay | Admitting: Endocrinology

## 2015-03-13 ENCOUNTER — Encounter: Payer: Self-pay | Admitting: Endocrinology

## 2015-03-13 VITALS — BP 130/87 | HR 113 | Temp 98.7°F | Ht 64.0 in | Wt 157.0 lb

## 2015-03-13 DIAGNOSIS — A047 Enterocolitis due to Clostridium difficile: Secondary | ICD-10-CM | POA: Diagnosis not present

## 2015-03-13 DIAGNOSIS — M810 Age-related osteoporosis without current pathological fracture: Secondary | ICD-10-CM | POA: Diagnosis not present

## 2015-03-13 DIAGNOSIS — I129 Hypertensive chronic kidney disease with stage 1 through stage 4 chronic kidney disease, or unspecified chronic kidney disease: Secondary | ICD-10-CM | POA: Diagnosis not present

## 2015-03-13 DIAGNOSIS — Z792 Long term (current) use of antibiotics: Secondary | ICD-10-CM | POA: Diagnosis not present

## 2015-03-13 DIAGNOSIS — Z7952 Long term (current) use of systemic steroids: Secondary | ICD-10-CM | POA: Diagnosis not present

## 2015-03-13 DIAGNOSIS — E1122 Type 2 diabetes mellitus with diabetic chronic kidney disease: Secondary | ICD-10-CM

## 2015-03-13 DIAGNOSIS — Z86718 Personal history of other venous thrombosis and embolism: Secondary | ICD-10-CM | POA: Diagnosis not present

## 2015-03-13 DIAGNOSIS — Z5181 Encounter for therapeutic drug level monitoring: Secondary | ICD-10-CM | POA: Diagnosis not present

## 2015-03-13 DIAGNOSIS — I69351 Hemiplegia and hemiparesis following cerebral infarction affecting right dominant side: Secondary | ICD-10-CM | POA: Diagnosis not present

## 2015-03-13 DIAGNOSIS — I5032 Chronic diastolic (congestive) heart failure: Secondary | ICD-10-CM | POA: Diagnosis not present

## 2015-03-13 DIAGNOSIS — N259 Disorder resulting from impaired renal tubular function, unspecified: Secondary | ICD-10-CM | POA: Diagnosis not present

## 2015-03-13 DIAGNOSIS — Z7901 Long term (current) use of anticoagulants: Secondary | ICD-10-CM | POA: Diagnosis not present

## 2015-03-13 DIAGNOSIS — N189 Chronic kidney disease, unspecified: Secondary | ICD-10-CM | POA: Diagnosis not present

## 2015-03-13 DIAGNOSIS — F329 Major depressive disorder, single episode, unspecified: Secondary | ICD-10-CM | POA: Diagnosis not present

## 2015-03-13 DIAGNOSIS — M150 Primary generalized (osteo)arthritis: Secondary | ICD-10-CM | POA: Diagnosis not present

## 2015-03-13 DIAGNOSIS — A0472 Enterocolitis due to Clostridium difficile, not specified as recurrent: Secondary | ICD-10-CM

## 2015-03-13 DIAGNOSIS — M329 Systemic lupus erythematosus, unspecified: Secondary | ICD-10-CM | POA: Diagnosis not present

## 2015-03-13 LAB — IBC PANEL
Iron: 74 ug/dL (ref 42–145)
Saturation Ratios: 25.4 % (ref 20.0–50.0)
Transferrin: 208 mg/dL — ABNORMAL LOW (ref 212.0–360.0)

## 2015-03-13 LAB — CBC WITH DIFFERENTIAL/PLATELET
Basophils Absolute: 0 10*3/uL (ref 0.0–0.1)
Basophils Relative: 0.3 % (ref 0.0–3.0)
Eosinophils Absolute: 0 10*3/uL (ref 0.0–0.7)
Eosinophils Relative: 0.4 % (ref 0.0–5.0)
HCT: 46.5 % — ABNORMAL HIGH (ref 36.0–46.0)
Hemoglobin: 15.2 g/dL — ABNORMAL HIGH (ref 12.0–15.0)
Lymphocytes Relative: 43.9 % (ref 12.0–46.0)
Lymphs Abs: 5.5 10*3/uL — ABNORMAL HIGH (ref 0.7–4.0)
MCHC: 32.7 g/dL (ref 30.0–36.0)
MCV: 90.7 fl (ref 78.0–100.0)
Monocytes Absolute: 0.6 10*3/uL (ref 0.1–1.0)
Monocytes Relative: 5.1 % (ref 3.0–12.0)
Neutro Abs: 6.4 10*3/uL (ref 1.4–7.7)
Neutrophils Relative %: 50.3 % (ref 43.0–77.0)
Platelets: 235 10*3/uL (ref 150.0–400.0)
RBC: 5.12 Mil/uL — ABNORMAL HIGH (ref 3.87–5.11)
RDW: 15.5 % (ref 11.5–15.5)
WBC: 12.6 10*3/uL — ABNORMAL HIGH (ref 4.0–10.5)

## 2015-03-13 LAB — BASIC METABOLIC PANEL
BUN: 13 mg/dL (ref 6–23)
CO2: 20 mEq/L (ref 19–32)
Calcium: 10 mg/dL (ref 8.4–10.5)
Chloride: 106 mEq/L (ref 96–112)
Creatinine, Ser: 0.66 mg/dL (ref 0.40–1.20)
GFR: 119.83 mL/min (ref >60.00)
Glucose, Bld: 115 mg/dL — ABNORMAL HIGH (ref 70–99)
Potassium: 3.3 mEq/L — ABNORMAL LOW (ref 3.5–5.1)
Sodium: 141 mEq/L (ref 135–145)

## 2015-03-13 LAB — MAGNESIUM: Magnesium: 1.6 mg/dL (ref 1.5–2.5)

## 2015-03-13 LAB — POCT GLYCOSYLATED HEMOGLOBIN (HGB A1C): Hemoglobin A1C: 6.3

## 2015-03-13 LAB — VITAMIN D 25 HYDROXY (VIT D DEFICIENCY, FRACTURES): VITD: 30.66 ng/mL (ref 30.00–100.00)

## 2015-03-13 MED ORDER — TRAMADOL HCL 50 MG PO TABS: 50.0000 mg | ORAL_TABLET | Freq: Four times a day (QID) | ORAL | Status: DC | PRN

## 2015-03-13 MED ORDER — SERTRALINE HCL 50 MG PO TABS: 50.0000 mg | ORAL_TABLET | Freq: Every day | ORAL | Status: DC

## 2015-03-13 NOTE — Patient Instructions (Addendum)
blood tests are requested for you today.  We'll let you know about the results. Please see the specialist for your stomach next week, as scheduled.  Here is a refill of the tramadol Due to an interaction, please reduce the zoloft to 50 mg daily Please come back for a follow-up appointment in 1 month.

## 2015-03-13 NOTE — Telephone Encounter (Signed)
Roland from [facility] calling for verbal ok for him to see pt at home 2 times a week for 4 wks # [phone]:  Madaline Brilliant

## 2015-03-13 NOTE — Progress Notes (Signed)
Subjective:    Patient ID: Connie Ruiz, female    DOB: 09/02/1960, 54 y.o.   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   Therapy: no medication now GDM: never DKA: never Severe hypoglycemia: never Pancreatitis: never Other: she has taken insulin off and on, when she has been on steroids (most recently in mid-2016)  Interval history: Dtr Connie Ruiz) says she sees pt approx qod--pt lives alone, and she does not have home care.  She does not check cbg's.  Diarrhea is approx 50% better.  She has 3-4 soft BM's per day.  She has appt next week with GI at [facility] next week.  Hypocalcemia was noted in the hospital: henies muscle cramps. Past Medical History  Diagnosis Date  . THYROID NODULE, LEFT 04/10/2009  . HYPERLIPIDEMIA 08/21/2007  . GOUT 08/21/2007  . HYPERTENSION 08/21/2007    Dr. Andree Elk, [place] 08/21/2007  . CEREBROVASCULAR ACCIDENT, ACUTE 04/15/2010  . GERD 08/21/2007  . RENAL INSUFFICIENCY 08/21/2007  . LUPUS 08/21/2007  . OSTEOPOROSIS 08/21/2007    Rheumatol at [facility]  . DVT, HX OF 08/21/2007  . CLOSTRIDIUM DIFFICILE COLITIS, HX OF 08/21/2007  . KIDNEY TRANSPLANTATION, HX OF 08/22/2007    s/p renal transplant-Dr. Andree Elk, [facility]  . Pulmonary embolism ([place]) 07/16/2010  . Renal failure   . Current use of long term anticoagulation     Dr. Andree Elk, [facility]  . Depression     Dr. Andree Elk, [facility]  . History of stroke with residual effects   . Right sided weakness   . CVA 04/17/2010  . Tachycardia   . DIABETES MELLITUS, TYPE II 08/21/2007  . Candida esophagitis ([place]) 11/12/2014  . Steroid-induced hyperglycemia 11/09/2014    Past Surgical History  Procedure Laterality Date  . Cholecystectomy    . Tubal ligation    .  Kidney transplant Right 2009  . Renal biopsy, open  1981  . Enteroscopy N/A 11/11/2014    Procedure: ENTEROSCOPY;  Surgeon: Ladene Artist, MD;  Location: [facility];  Service: Endoscopy;  Laterality: N/A;    Social History   Social History  . Marital Status: Divorced    Spouse Name: N/A  . Number of Children: 1  . Years of Education: N/A   Occupational History  . DISABILITY    Social History Main Topics  . Smoking status: Never Smoker   . Smokeless tobacco: Never Used  . Alcohol Use: No  . Drug Use: No  . Sexual Activity: No   Other Topics Concern  . Not on file   Social History Narrative   Retired   Regular exercise-no    Current Outpatient Prescriptions on File Prior to Visit  Medication Sig Dispense Refill  . alendronate (FOSAMAX) 70 MG tablet Take 70 mg by mouth once a week. Take on Saturdays with a full glass of water on an empty stomach.    Marland Kitchen ammonium lactate (AMLACTIN) 12 % cream Apply topically as needed for dry skin.    . Blood Glucose Monitoring Suppl (ONETOUCH VERIO IQ SYSTEM) W/DEVICE KIT Use to check blood sugar 1 time per day 1 kit 0  . calcitRIOL (ROCALTROL) 0.25 MCG capsule TAKE ONE CAPSULE BY MOUTH DAILY 30 capsule 0  . diltiazem (TIAZAC) 240 MG 24  hr capsule Take 1 capsule (240 mg total) by mouth daily. 30 capsule 11  . glucose blood (ONE TOUCH ULTRA TEST) test strip Use to check blood sugar 1 time per day 100 each 2  . losartan (COZAAR) 100 MG tablet Take 1 tablet (100 mg total) by mouth daily. 30 tablet 3  . mycophenolate (CELLCEPT) 250 MG capsule Take 250 mg by mouth 2 (two) times daily.    Marland Kitchen nystatin (MYCOSTATIN) powder Apply 1 g topically 4 (four) times daily as needed. For yeast under breast    . ondansetron (ZOFRAN ODT) 4 MG disintegrating tablet 65m ODT q4 hours prn nausea/vomiting (Patient taking differently: Take 4 mg by mouth every 8 (eight) hours as needed for nausea. ) 15 tablet 0  . ONETOUCH DELICA LANCETS 368TMISC Use to check blood sugar  1 time per day 100 each 2  . oxybutynin (DITROPAN) 5 MG tablet Take 5 mg by mouth 3 (three) times daily.    . predniSONE (DELTASONE) 5 MG tablet Take 5 mg by mouth daily with breakfast.    . tacrolimus (PROGRAF) 1 MG capsule Take 1 mg by mouth 2 (two) times daily.     .Marland Kitchentriamcinolone (KENALOG) 0.025 % cream Apply 1 application topically daily as needed (dry skin).     . vancomycin (VANCOCIN) 50 mg/mL oral solution Take 2.5 mLs (125 mg total) by mouth every 6 (six) hours. 100 mL 0  . warfarin (COUMADIN) 5 MG tablet Take 1 tablet (5 mg total) by mouth daily. 40 tablet 1   No current facility-administered medications on file prior to visit.    Allergies  Allergen Reactions  . Oxycodone-Acetaminophen Shortness Of Breath and Nausea Only  . Propoxyphene N-Acetaminophen Shortness Of Breath and Nausea Only  . Sulfonamide Derivatives Shortness Of Breath and Nausea Only  . Codeine Nausea Only  . Latex Rash  . Metoprolol Rash  . Morphine And Related Rash    IV site on arm is red, patient reports this is improving.  NO shortness of breath reported.    Family History  Problem Relation Age of Onset  . Heart attack Mother   . Heart disease Father   . Asthma Sister   . Asthma Sister   . Asthma Daughter   . Cancer Maternal Grandfather     prostate  . Cancer Paternal Grandfather     colon    BP 130/87 mmHg  Pulse 113  Temp(Src) 98.7 F (37.1 C) (Oral)  Ht '5\' 4"'  (1.626 m)  Wt 157 lb (71.215 kg)  BMI 26.94 kg/m2  SpO2 98%  Review of Systems Denies abd pain and BRBPR.  He says OA pain is well-controlled with tramadol    Objective:   Physical Exam VITAL SIGNS:  See vs page GENERAL: no distress ABDOMEN: abdomen is soft; slight diffuse tenderness.  no hepatosplenomegaly.  not distended.  no hernia Ext: 1+ bilat leg edema.    A1c=6.3%     Assessment & Plan:  OA: pain is well-controlled DM: no medication is needed now.  We'll follow Depression: we'll need to reduce zoloft, due to  an interaction.  Hypocalcemia: due for recheck.  Diarrhea, persistent, uncertain etiology.   Patient is advised the following: Patient Instructions  blood tests are requested for you today.  We'll let you know about the results. Please see the specialist for your stomach next week, as scheduled.  Here is a refill of the tramadol Due to an interaction, please reduce the zoloft to 50 mg  daily Please come back for a follow-up appointment in 1 month.

## 2015-03-13 NOTE — Telephone Encounter (Signed)
I contacted Roland and left a voicemail giving verbal orders.

## 2015-03-14 DIAGNOSIS — F329 Major depressive disorder, single episode, unspecified: Secondary | ICD-10-CM | POA: Diagnosis not present

## 2015-03-14 DIAGNOSIS — I5032 Chronic diastolic (congestive) heart failure: Secondary | ICD-10-CM | POA: Diagnosis not present

## 2015-03-14 DIAGNOSIS — I129 Hypertensive chronic kidney disease with stage 1 through stage 4 chronic kidney disease, or unspecified chronic kidney disease: Secondary | ICD-10-CM | POA: Diagnosis not present

## 2015-03-14 DIAGNOSIS — A047 Enterocolitis due to Clostridium difficile: Secondary | ICD-10-CM | POA: Diagnosis not present

## 2015-03-14 DIAGNOSIS — Z7952 Long term (current) use of systemic steroids: Secondary | ICD-10-CM | POA: Diagnosis not present

## 2015-03-14 DIAGNOSIS — I69351 Hemiplegia and hemiparesis following cerebral infarction affecting right dominant side: Secondary | ICD-10-CM | POA: Diagnosis not present

## 2015-03-14 DIAGNOSIS — Z86718 Personal history of other venous thrombosis and embolism: Secondary | ICD-10-CM | POA: Diagnosis not present

## 2015-03-14 DIAGNOSIS — M329 Systemic lupus erythematosus, unspecified: Secondary | ICD-10-CM | POA: Diagnosis not present

## 2015-03-14 DIAGNOSIS — Z5181 Encounter for therapeutic drug level monitoring: Secondary | ICD-10-CM | POA: Diagnosis not present

## 2015-03-14 DIAGNOSIS — Z792 Long term (current) use of antibiotics: Secondary | ICD-10-CM | POA: Diagnosis not present

## 2015-03-14 DIAGNOSIS — E1122 Type 2 diabetes mellitus with diabetic chronic kidney disease: Secondary | ICD-10-CM | POA: Diagnosis not present

## 2015-03-14 DIAGNOSIS — M150 Primary generalized (osteo)arthritis: Secondary | ICD-10-CM | POA: Diagnosis not present

## 2015-03-14 DIAGNOSIS — N189 Chronic kidney disease, unspecified: Secondary | ICD-10-CM | POA: Diagnosis not present

## 2015-03-14 DIAGNOSIS — Z7901 Long term (current) use of anticoagulants: Secondary | ICD-10-CM | POA: Diagnosis not present

## 2015-03-14 LAB — PTH, INTACT AND CALCIUM
Calcium: 9.1 mg/dL (ref 8.4–10.5)
PTH: 62 pg/mL (ref 14–64)

## 2015-03-15 DIAGNOSIS — M150 Primary generalized (osteo)arthritis: Secondary | ICD-10-CM | POA: Diagnosis not present

## 2015-03-16 DIAGNOSIS — M150 Primary generalized (osteo)arthritis: Secondary | ICD-10-CM | POA: Diagnosis not present

## 2015-03-17 DIAGNOSIS — Z7952 Long term (current) use of systemic steroids: Secondary | ICD-10-CM | POA: Diagnosis not present

## 2015-03-17 DIAGNOSIS — I129 Hypertensive chronic kidney disease with stage 1 through stage 4 chronic kidney disease, or unspecified chronic kidney disease: Secondary | ICD-10-CM | POA: Diagnosis not present

## 2015-03-17 DIAGNOSIS — Z5181 Encounter for therapeutic drug level monitoring: Secondary | ICD-10-CM | POA: Diagnosis not present

## 2015-03-17 DIAGNOSIS — Z792 Long term (current) use of antibiotics: Secondary | ICD-10-CM | POA: Diagnosis not present

## 2015-03-17 DIAGNOSIS — M150 Primary generalized (osteo)arthritis: Secondary | ICD-10-CM | POA: Diagnosis not present

## 2015-03-17 DIAGNOSIS — Z86718 Personal history of other venous thrombosis and embolism: Secondary | ICD-10-CM | POA: Diagnosis not present

## 2015-03-17 DIAGNOSIS — I69351 Hemiplegia and hemiparesis following cerebral infarction affecting right dominant side: Secondary | ICD-10-CM | POA: Diagnosis not present

## 2015-03-17 DIAGNOSIS — M329 Systemic lupus erythematosus, unspecified: Secondary | ICD-10-CM | POA: Diagnosis not present

## 2015-03-17 DIAGNOSIS — E1122 Type 2 diabetes mellitus with diabetic chronic kidney disease: Secondary | ICD-10-CM | POA: Diagnosis not present

## 2015-03-17 DIAGNOSIS — F329 Major depressive disorder, single episode, unspecified: Secondary | ICD-10-CM | POA: Diagnosis not present

## 2015-03-17 DIAGNOSIS — Z7901 Long term (current) use of anticoagulants: Secondary | ICD-10-CM | POA: Diagnosis not present

## 2015-03-17 DIAGNOSIS — A047 Enterocolitis due to Clostridium difficile: Secondary | ICD-10-CM | POA: Diagnosis not present

## 2015-03-17 DIAGNOSIS — N189 Chronic kidney disease, unspecified: Secondary | ICD-10-CM | POA: Diagnosis not present

## 2015-03-17 DIAGNOSIS — I5032 Chronic diastolic (congestive) heart failure: Secondary | ICD-10-CM | POA: Diagnosis not present

## 2015-03-18 ENCOUNTER — Telehealth: Payer: Self-pay | Admitting: Endocrinology

## 2015-03-18 DIAGNOSIS — Z792 Long term (current) use of antibiotics: Secondary | ICD-10-CM | POA: Diagnosis not present

## 2015-03-18 DIAGNOSIS — Z5181 Encounter for therapeutic drug level monitoring: Secondary | ICD-10-CM | POA: Diagnosis not present

## 2015-03-18 DIAGNOSIS — Z7901 Long term (current) use of anticoagulants: Secondary | ICD-10-CM | POA: Diagnosis not present

## 2015-03-18 DIAGNOSIS — I129 Hypertensive chronic kidney disease with stage 1 through stage 4 chronic kidney disease, or unspecified chronic kidney disease: Secondary | ICD-10-CM | POA: Diagnosis not present

## 2015-03-18 DIAGNOSIS — A047 Enterocolitis due to Clostridium difficile: Secondary | ICD-10-CM | POA: Diagnosis not present

## 2015-03-18 DIAGNOSIS — E1122 Type 2 diabetes mellitus with diabetic chronic kidney disease: Secondary | ICD-10-CM | POA: Diagnosis not present

## 2015-03-18 DIAGNOSIS — M329 Systemic lupus erythematosus, unspecified: Secondary | ICD-10-CM | POA: Diagnosis not present

## 2015-03-18 DIAGNOSIS — F329 Major depressive disorder, single episode, unspecified: Secondary | ICD-10-CM | POA: Diagnosis not present

## 2015-03-18 DIAGNOSIS — M150 Primary generalized (osteo)arthritis: Secondary | ICD-10-CM | POA: Diagnosis not present

## 2015-03-18 DIAGNOSIS — Z86718 Personal history of other venous thrombosis and embolism: Secondary | ICD-10-CM | POA: Diagnosis not present

## 2015-03-18 DIAGNOSIS — I5032 Chronic diastolic (congestive) heart failure: Secondary | ICD-10-CM | POA: Diagnosis not present

## 2015-03-18 DIAGNOSIS — N189 Chronic kidney disease, unspecified: Secondary | ICD-10-CM | POA: Diagnosis not present

## 2015-03-18 DIAGNOSIS — I69351 Hemiplegia and hemiparesis following cerebral infarction affecting right dominant side: Secondary | ICD-10-CM | POA: Diagnosis not present

## 2015-03-18 DIAGNOSIS — Z7952 Long term (current) use of systemic steroids: Secondary | ICD-10-CM | POA: Diagnosis not present

## 2015-03-18 NOTE — Telephone Encounter (Signed)
Ok to make visits, and help her to take her meds.  However, we don't need medical and medication management.  Any question about INR monitoring needs to go to coumadin clinic.

## 2015-03-18 NOTE — Telephone Encounter (Signed)
See note below and please advise, Thanks! 

## 2015-03-18 NOTE — Telephone Encounter (Signed)
Connie Ruiz with [facility] calling to see if it is ok to cont to see her 2 times a week for 3 wks and then once a week for 4 wks # [phone] for medical and medication management and INR monitoring

## 2015-03-19 DIAGNOSIS — M329 Systemic lupus erythematosus, unspecified: Secondary | ICD-10-CM | POA: Diagnosis not present

## 2015-03-19 DIAGNOSIS — Z86718 Personal history of other venous thrombosis and embolism: Secondary | ICD-10-CM | POA: Diagnosis not present

## 2015-03-19 DIAGNOSIS — Z940 Kidney transplant status: Secondary | ICD-10-CM | POA: Diagnosis not present

## 2015-03-19 DIAGNOSIS — D6861 Antiphospholipid syndrome: Secondary | ICD-10-CM | POA: Diagnosis not present

## 2015-03-19 DIAGNOSIS — M150 Primary generalized (osteo)arthritis: Secondary | ICD-10-CM | POA: Diagnosis not present

## 2015-03-19 DIAGNOSIS — D120 Benign neoplasm of cecum: Secondary | ICD-10-CM | POA: Diagnosis not present

## 2015-03-19 DIAGNOSIS — Z7901 Long term (current) use of anticoagulants: Secondary | ICD-10-CM | POA: Diagnosis not present

## 2015-03-19 DIAGNOSIS — R1084 Generalized abdominal pain: Secondary | ICD-10-CM | POA: Diagnosis not present

## 2015-03-19 DIAGNOSIS — K635 Polyp of colon: Secondary | ICD-10-CM | POA: Diagnosis not present

## 2015-03-19 NOTE — Telephone Encounter (Signed)
Spoke with Connie Ruiz to inform her of the verbal from [facility].SHe informed me that she got the INR monitoring order from the coumadin clinic

## 2015-03-20 DIAGNOSIS — M150 Primary generalized (osteo)arthritis: Secondary | ICD-10-CM | POA: Diagnosis not present

## 2015-03-21 ENCOUNTER — Telehealth: Payer: Self-pay | Admitting: Endocrinology

## 2015-03-21 ENCOUNTER — Ambulatory Visit (INDEPENDENT_AMBULATORY_CARE_PROVIDER_SITE_OTHER): Payer: Self-pay | Admitting: Cardiology

## 2015-03-21 DIAGNOSIS — Z792 Long term (current) use of antibiotics: Secondary | ICD-10-CM | POA: Diagnosis not present

## 2015-03-21 DIAGNOSIS — N189 Chronic kidney disease, unspecified: Secondary | ICD-10-CM | POA: Diagnosis not present

## 2015-03-21 DIAGNOSIS — Z86718 Personal history of other venous thrombosis and embolism: Secondary | ICD-10-CM | POA: Diagnosis not present

## 2015-03-21 DIAGNOSIS — A047 Enterocolitis due to Clostridium difficile: Secondary | ICD-10-CM | POA: Diagnosis not present

## 2015-03-21 DIAGNOSIS — I129 Hypertensive chronic kidney disease with stage 1 through stage 4 chronic kidney disease, or unspecified chronic kidney disease: Secondary | ICD-10-CM | POA: Diagnosis not present

## 2015-03-21 DIAGNOSIS — Z7952 Long term (current) use of systemic steroids: Secondary | ICD-10-CM | POA: Diagnosis not present

## 2015-03-21 DIAGNOSIS — I6350 Cerebral infarction due to unspecified occlusion or stenosis of unspecified cerebral artery: Secondary | ICD-10-CM

## 2015-03-21 DIAGNOSIS — E1122 Type 2 diabetes mellitus with diabetic chronic kidney disease: Secondary | ICD-10-CM | POA: Diagnosis not present

## 2015-03-21 DIAGNOSIS — F329 Major depressive disorder, single episode, unspecified: Secondary | ICD-10-CM | POA: Diagnosis not present

## 2015-03-21 DIAGNOSIS — I5032 Chronic diastolic (congestive) heart failure: Secondary | ICD-10-CM | POA: Diagnosis not present

## 2015-03-21 DIAGNOSIS — Z7901 Long term (current) use of anticoagulants: Secondary | ICD-10-CM | POA: Diagnosis not present

## 2015-03-21 DIAGNOSIS — M150 Primary generalized (osteo)arthritis: Secondary | ICD-10-CM | POA: Diagnosis not present

## 2015-03-21 DIAGNOSIS — I69351 Hemiplegia and hemiparesis following cerebral infarction affecting right dominant side: Secondary | ICD-10-CM | POA: Diagnosis not present

## 2015-03-21 DIAGNOSIS — Z5181 Encounter for therapeutic drug level monitoring: Secondary | ICD-10-CM | POA: Diagnosis not present

## 2015-03-21 DIAGNOSIS — M329 Systemic lupus erythematosus, unspecified: Secondary | ICD-10-CM | POA: Diagnosis not present

## 2015-03-21 LAB — POCT INR: INR: 8

## 2015-03-21 NOTE — Telephone Encounter (Signed)
Connie Ruiz stated that she is communicating with her Home health nurse and that they are completing her INR testing

## 2015-03-21 NOTE — Telephone Encounter (Signed)
Connie Ruiz from [facility] states she received a fax and would like to speak with and clarify some information   Please advise   Call back: [phone]

## 2015-03-22 DIAGNOSIS — M150 Primary generalized (osteo)arthritis: Secondary | ICD-10-CM | POA: Diagnosis not present

## 2015-03-23 DIAGNOSIS — F329 Major depressive disorder, single episode, unspecified: Secondary | ICD-10-CM | POA: Diagnosis not present

## 2015-03-23 DIAGNOSIS — I5032 Chronic diastolic (congestive) heart failure: Secondary | ICD-10-CM | POA: Diagnosis not present

## 2015-03-23 DIAGNOSIS — I69351 Hemiplegia and hemiparesis following cerebral infarction affecting right dominant side: Secondary | ICD-10-CM | POA: Diagnosis not present

## 2015-03-23 DIAGNOSIS — E1122 Type 2 diabetes mellitus with diabetic chronic kidney disease: Secondary | ICD-10-CM | POA: Diagnosis not present

## 2015-03-23 DIAGNOSIS — Z5181 Encounter for therapeutic drug level monitoring: Secondary | ICD-10-CM | POA: Diagnosis not present

## 2015-03-23 DIAGNOSIS — A047 Enterocolitis due to Clostridium difficile: Secondary | ICD-10-CM | POA: Diagnosis not present

## 2015-03-23 DIAGNOSIS — Z792 Long term (current) use of antibiotics: Secondary | ICD-10-CM | POA: Diagnosis not present

## 2015-03-23 DIAGNOSIS — Z7901 Long term (current) use of anticoagulants: Secondary | ICD-10-CM | POA: Diagnosis not present

## 2015-03-23 DIAGNOSIS — M150 Primary generalized (osteo)arthritis: Secondary | ICD-10-CM | POA: Diagnosis not present

## 2015-03-23 DIAGNOSIS — N189 Chronic kidney disease, unspecified: Secondary | ICD-10-CM | POA: Diagnosis not present

## 2015-03-23 DIAGNOSIS — M329 Systemic lupus erythematosus, unspecified: Secondary | ICD-10-CM | POA: Diagnosis not present

## 2015-03-23 DIAGNOSIS — I129 Hypertensive chronic kidney disease with stage 1 through stage 4 chronic kidney disease, or unspecified chronic kidney disease: Secondary | ICD-10-CM | POA: Diagnosis not present

## 2015-03-23 DIAGNOSIS — Z7952 Long term (current) use of systemic steroids: Secondary | ICD-10-CM | POA: Diagnosis not present

## 2015-03-23 DIAGNOSIS — Z86718 Personal history of other venous thrombosis and embolism: Secondary | ICD-10-CM | POA: Diagnosis not present

## 2015-03-24 ENCOUNTER — Ambulatory Visit (INDEPENDENT_AMBULATORY_CARE_PROVIDER_SITE_OTHER): Payer: Self-pay | Admitting: Pharmacist

## 2015-03-24 DIAGNOSIS — N189 Chronic kidney disease, unspecified: Secondary | ICD-10-CM | POA: Diagnosis not present

## 2015-03-24 DIAGNOSIS — Z7901 Long term (current) use of anticoagulants: Secondary | ICD-10-CM | POA: Diagnosis not present

## 2015-03-24 DIAGNOSIS — F329 Major depressive disorder, single episode, unspecified: Secondary | ICD-10-CM | POA: Diagnosis not present

## 2015-03-24 DIAGNOSIS — E1122 Type 2 diabetes mellitus with diabetic chronic kidney disease: Secondary | ICD-10-CM | POA: Diagnosis not present

## 2015-03-24 DIAGNOSIS — M150 Primary generalized (osteo)arthritis: Secondary | ICD-10-CM | POA: Diagnosis not present

## 2015-03-24 DIAGNOSIS — I129 Hypertensive chronic kidney disease with stage 1 through stage 4 chronic kidney disease, or unspecified chronic kidney disease: Secondary | ICD-10-CM | POA: Diagnosis not present

## 2015-03-24 DIAGNOSIS — Z5181 Encounter for therapeutic drug level monitoring: Secondary | ICD-10-CM | POA: Diagnosis not present

## 2015-03-24 DIAGNOSIS — I69351 Hemiplegia and hemiparesis following cerebral infarction affecting right dominant side: Secondary | ICD-10-CM | POA: Diagnosis not present

## 2015-03-24 DIAGNOSIS — M329 Systemic lupus erythematosus, unspecified: Secondary | ICD-10-CM | POA: Diagnosis not present

## 2015-03-24 DIAGNOSIS — I5032 Chronic diastolic (congestive) heart failure: Secondary | ICD-10-CM | POA: Diagnosis not present

## 2015-03-24 DIAGNOSIS — Z7952 Long term (current) use of systemic steroids: Secondary | ICD-10-CM | POA: Diagnosis not present

## 2015-03-24 DIAGNOSIS — A047 Enterocolitis due to Clostridium difficile: Secondary | ICD-10-CM | POA: Diagnosis not present

## 2015-03-24 DIAGNOSIS — I6350 Cerebral infarction due to unspecified occlusion or stenosis of unspecified cerebral artery: Secondary | ICD-10-CM

## 2015-03-24 DIAGNOSIS — Z792 Long term (current) use of antibiotics: Secondary | ICD-10-CM | POA: Diagnosis not present

## 2015-03-24 DIAGNOSIS — Z86718 Personal history of other venous thrombosis and embolism: Secondary | ICD-10-CM | POA: Diagnosis not present

## 2015-03-24 LAB — POCT INR: INR: 1.9

## 2015-03-25 DIAGNOSIS — Z86718 Personal history of other venous thrombosis and embolism: Secondary | ICD-10-CM | POA: Diagnosis not present

## 2015-03-25 DIAGNOSIS — I129 Hypertensive chronic kidney disease with stage 1 through stage 4 chronic kidney disease, or unspecified chronic kidney disease: Secondary | ICD-10-CM | POA: Diagnosis not present

## 2015-03-25 DIAGNOSIS — E1122 Type 2 diabetes mellitus with diabetic chronic kidney disease: Secondary | ICD-10-CM | POA: Diagnosis not present

## 2015-03-25 DIAGNOSIS — A047 Enterocolitis due to Clostridium difficile: Secondary | ICD-10-CM | POA: Diagnosis not present

## 2015-03-25 DIAGNOSIS — Z5181 Encounter for therapeutic drug level monitoring: Secondary | ICD-10-CM | POA: Diagnosis not present

## 2015-03-25 DIAGNOSIS — Z792 Long term (current) use of antibiotics: Secondary | ICD-10-CM | POA: Diagnosis not present

## 2015-03-25 DIAGNOSIS — Z7901 Long term (current) use of anticoagulants: Secondary | ICD-10-CM | POA: Diagnosis not present

## 2015-03-25 DIAGNOSIS — Z7952 Long term (current) use of systemic steroids: Secondary | ICD-10-CM | POA: Diagnosis not present

## 2015-03-25 DIAGNOSIS — M329 Systemic lupus erythematosus, unspecified: Secondary | ICD-10-CM | POA: Diagnosis not present

## 2015-03-25 DIAGNOSIS — F329 Major depressive disorder, single episode, unspecified: Secondary | ICD-10-CM | POA: Diagnosis not present

## 2015-03-25 DIAGNOSIS — I69351 Hemiplegia and hemiparesis following cerebral infarction affecting right dominant side: Secondary | ICD-10-CM | POA: Diagnosis not present

## 2015-03-25 DIAGNOSIS — I5032 Chronic diastolic (congestive) heart failure: Secondary | ICD-10-CM | POA: Diagnosis not present

## 2015-03-25 DIAGNOSIS — M150 Primary generalized (osteo)arthritis: Secondary | ICD-10-CM | POA: Diagnosis not present

## 2015-03-25 DIAGNOSIS — N189 Chronic kidney disease, unspecified: Secondary | ICD-10-CM | POA: Diagnosis not present

## 2015-03-26 DIAGNOSIS — I69351 Hemiplegia and hemiparesis following cerebral infarction affecting right dominant side: Secondary | ICD-10-CM | POA: Diagnosis not present

## 2015-03-26 DIAGNOSIS — M150 Primary generalized (osteo)arthritis: Secondary | ICD-10-CM | POA: Diagnosis not present

## 2015-03-26 DIAGNOSIS — Z7901 Long term (current) use of anticoagulants: Secondary | ICD-10-CM | POA: Diagnosis not present

## 2015-03-26 DIAGNOSIS — Z5181 Encounter for therapeutic drug level monitoring: Secondary | ICD-10-CM | POA: Diagnosis not present

## 2015-03-26 DIAGNOSIS — I129 Hypertensive chronic kidney disease with stage 1 through stage 4 chronic kidney disease, or unspecified chronic kidney disease: Secondary | ICD-10-CM | POA: Diagnosis not present

## 2015-03-26 DIAGNOSIS — Z792 Long term (current) use of antibiotics: Secondary | ICD-10-CM | POA: Diagnosis not present

## 2015-03-26 DIAGNOSIS — E1122 Type 2 diabetes mellitus with diabetic chronic kidney disease: Secondary | ICD-10-CM | POA: Diagnosis not present

## 2015-03-26 DIAGNOSIS — Z86718 Personal history of other venous thrombosis and embolism: Secondary | ICD-10-CM | POA: Diagnosis not present

## 2015-03-26 DIAGNOSIS — A047 Enterocolitis due to Clostridium difficile: Secondary | ICD-10-CM | POA: Diagnosis not present

## 2015-03-26 DIAGNOSIS — N189 Chronic kidney disease, unspecified: Secondary | ICD-10-CM | POA: Diagnosis not present

## 2015-03-26 DIAGNOSIS — M329 Systemic lupus erythematosus, unspecified: Secondary | ICD-10-CM | POA: Diagnosis not present

## 2015-03-26 DIAGNOSIS — Z7952 Long term (current) use of systemic steroids: Secondary | ICD-10-CM | POA: Diagnosis not present

## 2015-03-26 DIAGNOSIS — I5032 Chronic diastolic (congestive) heart failure: Secondary | ICD-10-CM | POA: Diagnosis not present

## 2015-03-26 DIAGNOSIS — F329 Major depressive disorder, single episode, unspecified: Secondary | ICD-10-CM | POA: Diagnosis not present

## 2015-03-27 ENCOUNTER — Encounter (HOSPITAL_COMMUNITY): Payer: Self-pay | Admitting: Emergency Medicine

## 2015-03-27 ENCOUNTER — Emergency Department (HOSPITAL_COMMUNITY)
Admission: EM | Admit: 2015-03-27 | Discharge: 2015-03-27 | Disposition: A | Discharge status: Home / Self Care | Payer: Medicare Other | Attending: Emergency Medicine | Admitting: Emergency Medicine

## 2015-03-27 ENCOUNTER — Emergency Department (HOSPITAL_COMMUNITY): Payer: Medicare Other

## 2015-03-27 DIAGNOSIS — Z86711 Personal history of pulmonary embolism: Secondary | ICD-10-CM | POA: Diagnosis not present

## 2015-03-27 DIAGNOSIS — Z87448 Personal history of other diseases of urinary system: Secondary | ICD-10-CM | POA: Insufficient documentation

## 2015-03-27 DIAGNOSIS — K2970 Gastritis, unspecified, without bleeding: Secondary | ICD-10-CM | POA: Diagnosis not present

## 2015-03-27 DIAGNOSIS — Z940 Kidney transplant status: Secondary | ICD-10-CM | POA: Diagnosis not present

## 2015-03-27 DIAGNOSIS — Z86718 Personal history of other venous thrombosis and embolism: Secondary | ICD-10-CM | POA: Insufficient documentation

## 2015-03-27 DIAGNOSIS — Z8673 Personal history of transient ischemic attack (TIA), and cerebral infarction without residual deficits: Secondary | ICD-10-CM | POA: Insufficient documentation

## 2015-03-27 DIAGNOSIS — E785 Hyperlipidemia, unspecified: Secondary | ICD-10-CM | POA: Insufficient documentation

## 2015-03-27 DIAGNOSIS — R112 Nausea with vomiting, unspecified: Secondary | ICD-10-CM | POA: Insufficient documentation

## 2015-03-27 DIAGNOSIS — R1030 Lower abdominal pain, unspecified: Secondary | ICD-10-CM

## 2015-03-27 DIAGNOSIS — E119 Type 2 diabetes mellitus without complications: Secondary | ICD-10-CM | POA: Diagnosis not present

## 2015-03-27 DIAGNOSIS — Z7952 Long term (current) use of systemic steroids: Secondary | ICD-10-CM | POA: Diagnosis not present

## 2015-03-27 DIAGNOSIS — Z8619 Personal history of other infectious and parasitic diseases: Secondary | ICD-10-CM | POA: Insufficient documentation

## 2015-03-27 DIAGNOSIS — Z79899 Other long term (current) drug therapy: Secondary | ICD-10-CM | POA: Insufficient documentation

## 2015-03-27 DIAGNOSIS — M109 Gout, unspecified: Secondary | ICD-10-CM | POA: Insufficient documentation

## 2015-03-27 DIAGNOSIS — K219 Gastro-esophageal reflux disease without esophagitis: Secondary | ICD-10-CM | POA: Diagnosis not present

## 2015-03-27 DIAGNOSIS — R1033 Periumbilical pain: Secondary | ICD-10-CM | POA: Diagnosis not present

## 2015-03-27 DIAGNOSIS — M150 Primary generalized (osteo)arthritis: Secondary | ICD-10-CM | POA: Diagnosis not present

## 2015-03-27 DIAGNOSIS — I509 Heart failure, unspecified: Secondary | ICD-10-CM | POA: Insufficient documentation

## 2015-03-27 DIAGNOSIS — Z9049 Acquired absence of other specified parts of digestive tract: Secondary | ICD-10-CM | POA: Diagnosis not present

## 2015-03-27 DIAGNOSIS — Z91040 Latex allergy status: Secondary | ICD-10-CM | POA: Insufficient documentation

## 2015-03-27 DIAGNOSIS — Z7901 Long term (current) use of anticoagulants: Secondary | ICD-10-CM | POA: Insufficient documentation

## 2015-03-27 DIAGNOSIS — R197 Diarrhea, unspecified: Secondary | ICD-10-CM | POA: Diagnosis not present

## 2015-03-27 DIAGNOSIS — R109 Unspecified abdominal pain: Secondary | ICD-10-CM | POA: Diagnosis not present

## 2015-03-27 DIAGNOSIS — Z9851 Tubal ligation status: Secondary | ICD-10-CM | POA: Insufficient documentation

## 2015-03-27 LAB — CBC WITH DIFFERENTIAL/PLATELET
Basophils Absolute: 0 10*3/uL (ref 0.0–0.1)
Basophils Relative: 0 %
Eosinophils Absolute: 0 10*3/uL (ref 0.0–0.7)
Eosinophils Relative: 0 %
HCT: 41.2 % (ref 36.0–46.0)
Hemoglobin: 13.9 g/dL (ref 12.0–15.0)
Lymphocytes Relative: 43 %
Lymphs Abs: 4.2 10*3/uL — ABNORMAL HIGH (ref 0.7–4.0)
MCH: 29.9 pg (ref 26.0–34.0)
MCHC: 33.7 g/dL (ref 30.0–36.0)
MCV: 88.6 fL (ref 78.0–100.0)
Monocytes Absolute: 0.8 10*3/uL (ref 0.1–1.0)
Monocytes Relative: 8 %
Neutro Abs: 4.8 10*3/uL (ref 1.7–7.7)
Neutrophils Relative %: 49 %
Platelets: 166 10*3/uL (ref 150–400)
RBC: 4.65 MIL/uL (ref 3.87–5.11)
RDW: 14.6 % (ref 11.5–15.5)
WBC: 9.8 10*3/uL (ref 4.0–10.5)

## 2015-03-27 LAB — URINALYSIS, ROUTINE W REFLEX MICROSCOPIC
Glucose, UA: NEGATIVE mg/dL
Hgb urine dipstick: NEGATIVE
Ketones, ur: NEGATIVE mg/dL
Nitrite: NEGATIVE
Protein, ur: 30 mg/dL — AB
Specific Gravity, Urine: 1.027 (ref 1.005–1.030)
Urobilinogen, UA: 1 mg/dL (ref 0.0–1.0)
pH: 6 (ref 5.0–8.0)

## 2015-03-27 LAB — COMPREHENSIVE METABOLIC PANEL
ALT: 22 U/L (ref 14–54)
AST: 40 U/L (ref 15–41)
Albumin: 3.4 g/dL — ABNORMAL LOW (ref 3.5–5.0)
Alkaline Phosphatase: 50 U/L (ref 38–126)
Anion gap: 9 (ref 5–15)
BUN: 11 mg/dL (ref 6–20)
CO2: 22 mmol/L (ref 22–32)
Calcium: 9.3 mg/dL (ref 8.9–10.3)
Chloride: 109 mmol/L (ref 101–111)
Creatinine, Ser: 0.69 mg/dL (ref 0.44–1.00)
GFR calc Af Amer: >60 mL/min (ref >60)
GFR calc non Af Amer: >60 mL/min (ref >60)
Glucose, Bld: 110 mg/dL — ABNORMAL HIGH (ref 65–99)
Potassium: 3.1 mmol/L — ABNORMAL LOW (ref 3.5–5.1)
Sodium: 140 mmol/L (ref 135–145)
Total Bilirubin: 0.9 mg/dL (ref 0.3–1.2)
Total Protein: 6.7 g/dL (ref 6.5–8.1)

## 2015-03-27 LAB — I-STAT CG4 LACTIC ACID, ED: Lactic Acid, Venous: 2.07 mmol/L (ref 0.5–2.0)

## 2015-03-27 LAB — URINE MICROSCOPIC-ADD ON

## 2015-03-27 LAB — TROPONIN I: Troponin I: <0.03 ng/mL (ref <0.031)

## 2015-03-27 LAB — LIPASE, BLOOD: Lipase: 28 U/L (ref 11–51)

## 2015-03-27 MED ORDER — HYDROCODONE-ACETAMINOPHEN 5-325 MG PO TABS: 1.0000 | ORAL_TABLET | ORAL | Status: DC | PRN

## 2015-03-27 MED ORDER — ONDANSETRON HCL 4 MG/2ML IJ SOLN
4.0000 mg | Freq: Once | INTRAMUSCULAR | Status: AC
  Administered 2015-03-27: 4 mg via INTRAVENOUS
  Filled 2015-03-27: qty 2

## 2015-03-27 MED ORDER — HYDROMORPHONE HCL 1 MG/ML IJ SOLN
1.0000 mg | Freq: Once | INTRAMUSCULAR | Status: AC
Start: 2015-03-27 — End: 2015-03-27
  Administered 2015-03-27: 1 mg via INTRAVENOUS
  Filled 2015-03-27: qty 1

## 2015-03-27 NOTE — ED Provider Notes (Addendum)
CSN: [id number]     Arrival date & time 03/27/15  1207 History   First MD Initiated Contact with Patient 03/27/15 1257     Chief Complaint  Patient presents with  . Abdominal Pain     (Consider location/radiation/quality/duration/timing/severity/associated sxs/prior Treatment) HPI Comments: Patient presents to the ER for evaluation of abdominal pain. Patient reports that she has been experiencing increased pain in her abdomen for the last 2 days. She indicates that the pain is in the low suprapubic region. She has had nausea and vomiting and diarrhea associated with the symptoms. She has not vomited today, however. Patient has not had any fever.  Patient is a 54 y.o. female presenting with abdominal pain.  Abdominal Pain Associated symptoms: diarrhea, nausea and vomiting     Past Medical History  Diagnosis Date  . THYROID NODULE, LEFT 04/10/2009  . HYPERLIPIDEMIA 08/21/2007  . GOUT 08/21/2007  . HYPERTENSION 08/21/2007    Dr. Andree Elk, [place] 08/21/2007  . CEREBROVASCULAR ACCIDENT, ACUTE 04/15/2010  . GERD 08/21/2007  . RENAL INSUFFICIENCY 08/21/2007  . LUPUS 08/21/2007  . OSTEOPOROSIS 08/21/2007    [facility]umatol at [facility]  . DVT, HX OF 08/21/2007  . CLOSTRIDIUM DIFFICILE COLITIS, HX OF 08/21/2007  . KIDNEY TRANSPLANTATION, HX OF 08/22/2007    s/p renal transplant-Dr. Andree Elk, [facility]  . Pulmonary embolism ([place]) 07/16/2010  . Renal failure   . Current use of long term anticoagulation     Dr. Andree Elk, [facility]  . Depression     Dr. Andree Elk, [facility]  . History of stroke with residual effects   . Right sided weakness   . CVA 04/17/2010  . Tachycardia   . DIABETES MELLITUS, TYPE II 08/21/2007  . Candida esophagitis ([place]) 11/12/2014  . Steroid-induced hyperglycemia 11/09/2014   Past Surgical History  Procedure Laterality Date  . Cholecystectomy    . Tubal ligation    . Kidney transplant Right 2009  . Renal biopsy, open  1981  . Enteroscopy N/A 11/11/2014    Procedure:  ENTEROSCOPY;  Surgeon: Ladene Artist, MD;  Location: [facility];  Service: Endoscopy;  Laterality: N/A;   Family History  Problem Relation Age of Onset  . Heart attack Mother   . Heart disease Father   . Asthma Sister   . Asthma Sister   . Asthma Daughter   . Cancer Maternal Grandfather     prostate  . Cancer Paternal Grandfather     colon   Social History  Substance Use Topics  . Smoking status: Never Smoker   . Smokeless tobacco: Never Used  . Alcohol Use: No   OB History    No data available     Review of Systems  Gastrointestinal: Positive for nausea, vomiting, abdominal pain and diarrhea.  All other systems reviewed and are negative.     Allergies  Oxycodone-acetaminophen; Propoxyphene n-acetaminophen; Sulfonamide derivatives; Codeine; Other; Gabapentin; Latex; Metoprolol; and Morphine and related  Home Medications   Prior to Admission medications   Medication Sig Start Date End Date Taking? Authorizing Provider  alendronate (FOSAMAX) 70 MG tablet Take 70 mg by mouth once a week. Take on Saturdays with a full glass of water on an empty stomach.   Yes Historical Provider, MD  ammonium lactate (AMLACTIN) 12 % cream Apply topically as needed for dry skin.   Yes Historical Provider, MD  atorvastatin (LIPITOR) 40 MG tablet Take 40 mg by mouth daily. 03/21/15  Yes Historical Provider, MD  calcitRIOL (ROCALTROL) 0.25 MCG capsule  TAKE ONE CAPSULE BY MOUTH DAILY 02/11/15  Yes Renato Shin, MD  diltiazem [facility]) 240 MG 24 hr capsule Take 1 capsule (240 mg total) by mouth daily. 10/04/14  Yes Renato Shin, MD  esomeprazole (NEXIUM) 20 MG capsule Take 20 mg by mouth daily. 03/21/15  Yes Historical Provider, MD  lansoprazole (PREVACID) 15 MG capsule Take 15 mg by mouth daily.   Yes Historical Provider, MD  losartan (COZAAR) 100 MG tablet Take 1 tablet (100 mg total) by mouth daily. 02/06/15  Yes Renato Shin, MD  Multiple Vitamin (MULTIVITAMIN WITH MINERALS) TABS tablet Take 1  tablet by mouth daily.   Yes Historical Provider, MD  mycophenolate (CELLCEPT) 250 MG capsule Take 250 mg by mouth 2 (two) times daily.   Yes Historical Provider, MD  nystatin (MYCOSTATIN) powder Apply 1 g topically 4 (four) times daily as needed. For yeast under breast   Yes Historical Provider, MD  ondansetron (ZOFRAN ODT) 4 MG disintegrating tablet 55m ODT q4 hours prn nausea/vomiting Patient taking differently: Take 4 mg by mouth every 8 (eight) hours as needed for nausea.  06/28/14  Yes SSherwood Gambler MD  oxybutynin (DITROPAN) 5 MG tablet Take 5 mg by mouth daily as needed for bladder spasms.    Yes Historical Provider, MD  predniSONE (DELTASONE) 5 MG tablet Take 5 mg by mouth daily with breakfast.   Yes Historical Provider, MD  sertraline (ZOLOFT) 50 MG tablet Take 1 tablet (50 mg total) by mouth daily. 03/13/15  Yes SRenato Shin MD  tacrolimus (PROGRAF) 1 MG capsule Take 1 mg by mouth 2 (two) times daily.  04/16/14  Yes Historical Provider, MD  traMADol (ULTRAM) 50 MG tablet Take 1-2 tablets (50-100 mg total) by mouth every 6 (six) hours as needed for moderate pain. 03/13/15  Yes SRenato Shin MD  triamcinolone (KENALOG) 0.025 % cream Apply 1 application topically daily as needed (dry skin).    Yes Historical Provider, MD  warfarin (COUMADIN) 5 MG tablet Take 1 tablet (5 mg total) by mouth daily. 02/22/15  Yes Belkys A Regalado, MD  Blood Glucose Monitoring Suppl (ONETOUCH VERIO IQ SYSTEM) W/DEVICE KIT Use to check blood sugar 1 time per day 02/12/15   SRenato Shin MD  glucose blood (ONE TOUCH ULTRA TEST) test strip Use to check blood sugar 1 time per day 02/12/15   SRenato Shin MD  [facility] LANC[facility] 354TMISC Use to check blood sugar 1 time per day 02/12/15   SRenato Shin MD  vancomycin (VANCOCIN) 50 mg/mL oral solution Take 2.5 mLs (125 mg total) by mouth every 6 (six) hours. Patient not taking: Reported on 03/27/2015 02/27/15   OVelvet Bathe MD   BP 161/102 mmHg  Pulse 93  Temp(Src) 98.5  F (36.9 C) (Oral)  Resp 20  SpO2 91% Physical Exam  Constitutional: She is oriented to person, place, and time. She appears well-developed and well-nourished. No distress.  HENT:  Head: Normocephalic and atraumatic.  Right Ear: Hearing normal.  Left Ear: Hearing normal.  Nose: Nose normal.  Mouth/Throat: Oropharynx is clear and moist and mucous membranes are normal.  Eyes: Conjunctivae and EOM are normal. Pupils are equal, round, and reactive to light.  Neck: Normal range of motion. Neck supple.  Cardiovascular: Regular rhythm, S1 normal and S2 normal.  Exam reveals no gallop and no friction rub.   No murmur heard. Pulmonary/Chest: Effort normal and breath sounds normal. No respiratory distress. She exhibits no tenderness.  Abdominal: Soft. Normal appearance and bowel sounds are  normal. There is no hepatosplenomegaly. There is tenderness in the periumbilical area and suprapubic area. There is no rebound, no guarding, no tenderness at McBurney's point and negative Murphy's sign. No hernia.  Musculoskeletal: Normal range of motion.  Neurological: She is alert and oriented to person, place, and time. She has normal strength. No cranial nerve deficit or sensory deficit. Coordination normal. GCS eye subscore is 4. GCS verbal subscore is 5. GCS motor subscore is 6.  Skin: Skin is warm, dry and intact. No rash noted. No cyanosis.  Psychiatric: She has a normal mood and affect. Her speech is normal and behavior is normal. Thought content normal.  Nursing note and vitals reviewed.   ED Course  Procedures (including critical care time) Labs Review Labs Reviewed  CBC WITH DIFFERENTIAL/PLATELET - Abnormal; Notable for the following:    Lymphs Abs 4.2 (*)    All other components within normal limits  COMPREHENSIVE METABOLIC PANEL - Abnormal; Notable for the following:    Potassium 3.1 (*)    Glucose, Bld 110 (*)    Albumin 3.4 (*)    All other components within normal limits  I-STAT CG4  LACTIC ACID, ED - Abnormal; Notable for the following:    Lactic Acid, Venous 2.07 (*)    All other components within normal limits  TROPONIN I  LIPASE, BLOOD  URINALYSIS, ROUTINE W REFLEX MICROSCOPIC (NOT AT [facility])    Imaging Review Dg Abd Acute W/chest  03/27/2015  CLINICAL DATA:  Right-sided abdominal pain for 3 days. Nausea, vomiting and diarrhea. EXAM: DG ABDOMEN ACUTE W/ 1V CHEST COMPARISON:  Abdominal film 02/24/2015 FINDINGS: The upright chest x-ray demonstrates normal cardiomediastinal silhouette. Mild tortuosity of the thoracic aorta is stable. The lungs are clear. No pleural effusion. The left IJ catheter has been removed. Stable thyroid goiter. Two views of the abdomen demonstrate an unremarkable bowel gas pattern. No findings for obstruction or perforation. The soft tissues are grossly maintained. Scattered abdominal calcifications are unchanged. These are actual in the subcutaneous tissues based on prior CT scans. The bony structures are intact. IMPRESSION: No acute cardiopulmonary findings. No plain film findings for an acute abdominal process. Electronically Signed   By: Marijo Sanes M.D.   On: 03/27/2015 13:54   I have personally reviewed and evaluated these images and lab results as part of my medical decision-making.   EKG Interpretation None      MDM   Final diagnoses:  None   abdominal pain  Patient presents to the ER for evaluation of abdominal pain of unclear etiology. Patient has a history of recurrent abdominal pain. Patient was seen in gastroenterology at [facility] on October 12. Review of the records at that time reveals that etiology of the pain was unclear, although her lupus was considered a possible etiology. She did undergo recent CT scan at [facility] that did not show any acute abnormality. X-ray today shows normal bowel pattern, normal lung fields. Blood work is normal. This includes a normal creatinine. No concern for kidney graft rejection. Vital signs are  stable. Cause of pain is unclear at this time, but she does not have an emergent condition that requires hospitalization or transfer to [facility]. She was told that she needs follow-up with her specialists at [facility]. Increased analgesia to Vicodin.  Addendum: Discussed findings with patient. Informed her that she would need to follow-up with her doctors at [facility] and I would prescribe her stronger analgesia. She seemed okay with this plan. I wrote her prescription for hydrocodone.  She told the nurse that she was allergic to hydrocodone as well. She did have an allergy for oxycodone listed as nausea alone. When I went back to find out what her allergies actually were, her daughter was in the room and was irate. She told me that "she has been at this hopsital five times and somebody has to find out what's wrong". I told her that we have exhausedt all the tests I can do in the ER and also that her doctors at [facility] were not sure what was causing her pain, it probably would not be solved today. Daughter then began writing down staff names and a began demanding to talk to a M[facility] and to be transferred to [facility].. At this point I see no reason for transfer and patient will be discharged.  Orpah Greek, MD 03/27/15 [place], MD 03/27/15 1640

## 2015-03-27 NOTE — ED Notes (Signed)
Per [facility] pt from home co abd pain x 2 days, per ems pt was seen the 12 th at [facility] for similar symptoms. Dx gl abd pain and instructed to follow up with GI specialist, yet pt failed to do . Pt co NVD x 3 days. Last emesis yesterday. No home  meds were taken today. Pt is normally ambulatory with minimal assistance.

## 2015-03-27 NOTE — Discharge Instructions (Signed)

## 2015-03-27 NOTE — ED Notes (Signed)
Writer was notified that pt is a difficult stick and RN will do a ultra sound IV for blood work

## 2015-03-27 NOTE — ED Notes (Signed)
Walked in pt's room in order to provide discharge instructions. Daughter at bedside appears to be very upset stating that the diagnosis of abdominal pain is unacceptable and that Dr needs to find out what is causing her mother's symptoms. Also refuses to take the prescription due to allergy . This RN attempted to explain to daughter and pt that the prescribed hydrocodone is different from oxycodone which pt is allergic to. Pt requests to speak to Dr Betsey Holiday. Dr was at bedside and explained to pt and daughter that he will not prescibe another medication since pt is not allergic to hydrocodone. Pt's daughter still upset , requested director's phone number. Higinio Roger number was provided.

## 2015-03-28 ENCOUNTER — Ambulatory Visit (INDEPENDENT_AMBULATORY_CARE_PROVIDER_SITE_OTHER): Payer: Self-pay | Admitting: Cardiovascular Disease

## 2015-03-28 DIAGNOSIS — Z7901 Long term (current) use of anticoagulants: Secondary | ICD-10-CM | POA: Diagnosis not present

## 2015-03-28 DIAGNOSIS — Z7952 Long term (current) use of systemic steroids: Secondary | ICD-10-CM | POA: Diagnosis not present

## 2015-03-28 DIAGNOSIS — M150 Primary generalized (osteo)arthritis: Secondary | ICD-10-CM | POA: Diagnosis not present

## 2015-03-28 DIAGNOSIS — Z5181 Encounter for therapeutic drug level monitoring: Secondary | ICD-10-CM | POA: Diagnosis not present

## 2015-03-28 DIAGNOSIS — Z86718 Personal history of other venous thrombosis and embolism: Secondary | ICD-10-CM | POA: Diagnosis not present

## 2015-03-28 DIAGNOSIS — I5032 Chronic diastolic (congestive) heart failure: Secondary | ICD-10-CM | POA: Diagnosis not present

## 2015-03-28 DIAGNOSIS — F329 Major depressive disorder, single episode, unspecified: Secondary | ICD-10-CM | POA: Diagnosis not present

## 2015-03-28 DIAGNOSIS — A047 Enterocolitis due to Clostridium difficile: Secondary | ICD-10-CM | POA: Diagnosis not present

## 2015-03-28 DIAGNOSIS — I69351 Hemiplegia and hemiparesis following cerebral infarction affecting right dominant side: Secondary | ICD-10-CM | POA: Diagnosis not present

## 2015-03-28 DIAGNOSIS — I6350 Cerebral infarction due to unspecified occlusion or stenosis of unspecified cerebral artery: Secondary | ICD-10-CM

## 2015-03-28 DIAGNOSIS — E1122 Type 2 diabetes mellitus with diabetic chronic kidney disease: Secondary | ICD-10-CM | POA: Diagnosis not present

## 2015-03-28 DIAGNOSIS — I129 Hypertensive chronic kidney disease with stage 1 through stage 4 chronic kidney disease, or unspecified chronic kidney disease: Secondary | ICD-10-CM | POA: Diagnosis not present

## 2015-03-28 DIAGNOSIS — N189 Chronic kidney disease, unspecified: Secondary | ICD-10-CM | POA: Diagnosis not present

## 2015-03-28 DIAGNOSIS — M329 Systemic lupus erythematosus, unspecified: Secondary | ICD-10-CM | POA: Diagnosis not present

## 2015-03-28 DIAGNOSIS — Z792 Long term (current) use of antibiotics: Secondary | ICD-10-CM | POA: Diagnosis not present

## 2015-03-28 LAB — POCT INR: INR: 3.3

## 2015-03-29 DIAGNOSIS — M150 Primary generalized (osteo)arthritis: Secondary | ICD-10-CM | POA: Diagnosis not present

## 2015-03-30 DIAGNOSIS — M150 Primary generalized (osteo)arthritis: Secondary | ICD-10-CM | POA: Diagnosis not present

## 2015-03-31 ENCOUNTER — Ambulatory Visit: Payer: Medicare Other | Admitting: Endocrinology

## 2015-03-31 DIAGNOSIS — M150 Primary generalized (osteo)arthritis: Secondary | ICD-10-CM | POA: Diagnosis not present

## 2015-04-01 ENCOUNTER — Telehealth: Payer: Self-pay | Admitting: Endocrinology

## 2015-04-01 DIAGNOSIS — F329 Major depressive disorder, single episode, unspecified: Secondary | ICD-10-CM | POA: Diagnosis not present

## 2015-04-01 DIAGNOSIS — I5032 Chronic diastolic (congestive) heart failure: Secondary | ICD-10-CM | POA: Diagnosis not present

## 2015-04-01 DIAGNOSIS — N189 Chronic kidney disease, unspecified: Secondary | ICD-10-CM | POA: Diagnosis not present

## 2015-04-01 DIAGNOSIS — Z7952 Long term (current) use of systemic steroids: Secondary | ICD-10-CM | POA: Diagnosis not present

## 2015-04-01 DIAGNOSIS — I129 Hypertensive chronic kidney disease with stage 1 through stage 4 chronic kidney disease, or unspecified chronic kidney disease: Secondary | ICD-10-CM | POA: Diagnosis not present

## 2015-04-01 DIAGNOSIS — Z792 Long term (current) use of antibiotics: Secondary | ICD-10-CM | POA: Diagnosis not present

## 2015-04-01 DIAGNOSIS — Z86718 Personal history of other venous thrombosis and embolism: Secondary | ICD-10-CM | POA: Diagnosis not present

## 2015-04-01 DIAGNOSIS — M150 Primary generalized (osteo)arthritis: Secondary | ICD-10-CM | POA: Diagnosis not present

## 2015-04-01 DIAGNOSIS — Z5181 Encounter for therapeutic drug level monitoring: Secondary | ICD-10-CM | POA: Diagnosis not present

## 2015-04-01 DIAGNOSIS — M329 Systemic lupus erythematosus, unspecified: Secondary | ICD-10-CM | POA: Diagnosis not present

## 2015-04-01 DIAGNOSIS — Z7901 Long term (current) use of anticoagulants: Secondary | ICD-10-CM | POA: Diagnosis not present

## 2015-04-01 DIAGNOSIS — E1122 Type 2 diabetes mellitus with diabetic chronic kidney disease: Secondary | ICD-10-CM | POA: Diagnosis not present

## 2015-04-01 DIAGNOSIS — A047 Enterocolitis due to Clostridium difficile: Secondary | ICD-10-CM | POA: Diagnosis not present

## 2015-04-01 DIAGNOSIS — I69351 Hemiplegia and hemiparesis following cerebral infarction affecting right dominant side: Secondary | ICD-10-CM | POA: Diagnosis not present

## 2015-04-01 MED ORDER — TRAMADOL HCL 50 MG PO TABS: 50.0000 mg | ORAL_TABLET | Freq: Four times a day (QID) | ORAL | Status: DC | PRN

## 2015-04-01 NOTE — Telephone Encounter (Signed)
i printed 

## 2015-04-01 NOTE — Telephone Encounter (Signed)
See note below and please advise if ok to refill medication. Thanks!

## 2015-04-01 NOTE — Telephone Encounter (Signed)
Crystal nurse with [facility] calling for refill of tramadol for the pt.

## 2015-04-01 NOTE — Telephone Encounter (Signed)
Rx sent to pt's local [facility].

## 2015-04-02 DIAGNOSIS — M150 Primary generalized (osteo)arthritis: Secondary | ICD-10-CM | POA: Diagnosis not present

## 2015-04-03 DIAGNOSIS — M329 Systemic lupus erythematosus, unspecified: Secondary | ICD-10-CM | POA: Diagnosis not present

## 2015-04-03 DIAGNOSIS — M150 Primary generalized (osteo)arthritis: Secondary | ICD-10-CM | POA: Diagnosis not present

## 2015-04-03 DIAGNOSIS — M810 Age-related osteoporosis without current pathological fracture: Secondary | ICD-10-CM | POA: Diagnosis not present

## 2015-04-03 DIAGNOSIS — Z86718 Personal history of other venous thrombosis and embolism: Secondary | ICD-10-CM | POA: Diagnosis not present

## 2015-04-03 DIAGNOSIS — R109 Unspecified abdominal pain: Secondary | ICD-10-CM | POA: Diagnosis not present

## 2015-04-03 DIAGNOSIS — Z7901 Long term (current) use of anticoagulants: Secondary | ICD-10-CM | POA: Diagnosis not present

## 2015-04-03 DIAGNOSIS — Z7952 Long term (current) use of systemic steroids: Secondary | ICD-10-CM | POA: Diagnosis not present

## 2015-04-04 ENCOUNTER — Ambulatory Visit (INDEPENDENT_AMBULATORY_CARE_PROVIDER_SITE_OTHER): Payer: Self-pay | Admitting: Pharmacist

## 2015-04-04 DIAGNOSIS — E1122 Type 2 diabetes mellitus with diabetic chronic kidney disease: Secondary | ICD-10-CM | POA: Diagnosis not present

## 2015-04-04 DIAGNOSIS — M329 Systemic lupus erythematosus, unspecified: Secondary | ICD-10-CM | POA: Diagnosis not present

## 2015-04-04 DIAGNOSIS — Z792 Long term (current) use of antibiotics: Secondary | ICD-10-CM | POA: Diagnosis not present

## 2015-04-04 DIAGNOSIS — N189 Chronic kidney disease, unspecified: Secondary | ICD-10-CM | POA: Diagnosis not present

## 2015-04-04 DIAGNOSIS — I69351 Hemiplegia and hemiparesis following cerebral infarction affecting right dominant side: Secondary | ICD-10-CM | POA: Diagnosis not present

## 2015-04-04 DIAGNOSIS — Z86718 Personal history of other venous thrombosis and embolism: Secondary | ICD-10-CM | POA: Diagnosis not present

## 2015-04-04 DIAGNOSIS — A047 Enterocolitis due to Clostridium difficile: Secondary | ICD-10-CM | POA: Diagnosis not present

## 2015-04-04 DIAGNOSIS — F329 Major depressive disorder, single episode, unspecified: Secondary | ICD-10-CM | POA: Diagnosis not present

## 2015-04-04 DIAGNOSIS — I5032 Chronic diastolic (congestive) heart failure: Secondary | ICD-10-CM | POA: Diagnosis not present

## 2015-04-04 DIAGNOSIS — M150 Primary generalized (osteo)arthritis: Secondary | ICD-10-CM | POA: Diagnosis not present

## 2015-04-04 DIAGNOSIS — Z7901 Long term (current) use of anticoagulants: Secondary | ICD-10-CM | POA: Diagnosis not present

## 2015-04-04 DIAGNOSIS — I6350 Cerebral infarction due to unspecified occlusion or stenosis of unspecified cerebral artery: Secondary | ICD-10-CM

## 2015-04-04 DIAGNOSIS — Z5181 Encounter for therapeutic drug level monitoring: Secondary | ICD-10-CM | POA: Diagnosis not present

## 2015-04-04 DIAGNOSIS — Z7952 Long term (current) use of systemic steroids: Secondary | ICD-10-CM | POA: Diagnosis not present

## 2015-04-04 DIAGNOSIS — I129 Hypertensive chronic kidney disease with stage 1 through stage 4 chronic kidney disease, or unspecified chronic kidney disease: Secondary | ICD-10-CM | POA: Diagnosis not present

## 2015-04-04 LAB — POCT INR: INR: 3.3

## 2015-04-05 DIAGNOSIS — M150 Primary generalized (osteo)arthritis: Secondary | ICD-10-CM | POA: Diagnosis not present

## 2015-04-06 DIAGNOSIS — M150 Primary generalized (osteo)arthritis: Secondary | ICD-10-CM | POA: Diagnosis not present

## 2015-04-07 DIAGNOSIS — M150 Primary generalized (osteo)arthritis: Secondary | ICD-10-CM | POA: Diagnosis not present

## 2015-04-08 DIAGNOSIS — M150 Primary generalized (osteo)arthritis: Secondary | ICD-10-CM | POA: Diagnosis not present

## 2015-04-09 ENCOUNTER — Emergency Department (HOSPITAL_COMMUNITY): Payer: Medicare Other

## 2015-04-09 ENCOUNTER — Emergency Department (HOSPITAL_COMMUNITY)
Admission: EM | Admit: 2015-04-09 | Discharge: 2015-04-09 | Disposition: A | Discharge status: Home / Self Care | Payer: Medicare Other | Attending: Emergency Medicine | Admitting: Emergency Medicine

## 2015-04-09 ENCOUNTER — Encounter (HOSPITAL_COMMUNITY): Payer: Self-pay | Admitting: Emergency Medicine

## 2015-04-09 DIAGNOSIS — E119 Type 2 diabetes mellitus without complications: Secondary | ICD-10-CM | POA: Diagnosis not present

## 2015-04-09 DIAGNOSIS — Z87448 Personal history of other diseases of urinary system: Secondary | ICD-10-CM | POA: Insufficient documentation

## 2015-04-09 DIAGNOSIS — R519 Headache, unspecified: Secondary | ICD-10-CM

## 2015-04-09 DIAGNOSIS — R197 Diarrhea, unspecified: Secondary | ICD-10-CM | POA: Diagnosis not present

## 2015-04-09 DIAGNOSIS — R110 Nausea: Secondary | ICD-10-CM | POA: Diagnosis not present

## 2015-04-09 DIAGNOSIS — R1030 Lower abdominal pain, unspecified: Secondary | ICD-10-CM

## 2015-04-09 DIAGNOSIS — R51 Headache: Secondary | ICD-10-CM | POA: Diagnosis not present

## 2015-04-09 DIAGNOSIS — Z8619 Personal history of other infectious and parasitic diseases: Secondary | ICD-10-CM | POA: Diagnosis not present

## 2015-04-09 DIAGNOSIS — M150 Primary generalized (osteo)arthritis: Secondary | ICD-10-CM | POA: Diagnosis not present

## 2015-04-09 DIAGNOSIS — Z86711 Personal history of pulmonary embolism: Secondary | ICD-10-CM | POA: Insufficient documentation

## 2015-04-09 DIAGNOSIS — Z7901 Long term (current) use of anticoagulants: Secondary | ICD-10-CM | POA: Insufficient documentation

## 2015-04-09 DIAGNOSIS — R404 Transient alteration of awareness: Secondary | ICD-10-CM | POA: Diagnosis not present

## 2015-04-09 DIAGNOSIS — R1032 Left lower quadrant pain: Secondary | ICD-10-CM | POA: Diagnosis not present

## 2015-04-09 DIAGNOSIS — I509 Heart failure, unspecified: Secondary | ICD-10-CM | POA: Diagnosis not present

## 2015-04-09 DIAGNOSIS — E785 Hyperlipidemia, unspecified: Secondary | ICD-10-CM | POA: Diagnosis not present

## 2015-04-09 DIAGNOSIS — I10 Essential (primary) hypertension: Secondary | ICD-10-CM | POA: Insufficient documentation

## 2015-04-09 DIAGNOSIS — Z86718 Personal history of other venous thrombosis and embolism: Secondary | ICD-10-CM | POA: Insufficient documentation

## 2015-04-09 DIAGNOSIS — K219 Gastro-esophageal reflux disease without esophagitis: Secondary | ICD-10-CM | POA: Insufficient documentation

## 2015-04-09 DIAGNOSIS — N289 Disorder of kidney and ureter, unspecified: Secondary | ICD-10-CM | POA: Diagnosis not present

## 2015-04-09 DIAGNOSIS — N179 Acute kidney failure, unspecified: Secondary | ICD-10-CM | POA: Diagnosis not present

## 2015-04-09 DIAGNOSIS — M818 Other osteoporosis without current pathological fracture: Secondary | ICD-10-CM | POA: Insufficient documentation

## 2015-04-09 DIAGNOSIS — M109 Gout, unspecified: Secondary | ICD-10-CM | POA: Insufficient documentation

## 2015-04-09 DIAGNOSIS — R531 Weakness: Secondary | ICD-10-CM | POA: Diagnosis not present

## 2015-04-09 DIAGNOSIS — Z7952 Long term (current) use of systemic steroids: Secondary | ICD-10-CM | POA: Insufficient documentation

## 2015-04-09 DIAGNOSIS — Z8673 Personal history of transient ischemic attack (TIA), and cerebral infarction without residual deficits: Secondary | ICD-10-CM | POA: Diagnosis not present

## 2015-04-09 DIAGNOSIS — Z91040 Latex allergy status: Secondary | ICD-10-CM | POA: Diagnosis not present

## 2015-04-09 DIAGNOSIS — Z940 Kidney transplant status: Secondary | ICD-10-CM | POA: Insufficient documentation

## 2015-04-09 DIAGNOSIS — F329 Major depressive disorder, single episode, unspecified: Secondary | ICD-10-CM | POA: Diagnosis not present

## 2015-04-09 DIAGNOSIS — Z79899 Other long term (current) drug therapy: Secondary | ICD-10-CM | POA: Diagnosis not present

## 2015-04-09 LAB — CBC WITH DIFFERENTIAL/PLATELET
Basophils Absolute: 0 10*3/uL (ref 0.0–0.1)
Basophils Relative: 0 %
Eosinophils Absolute: 0 10*3/uL (ref 0.0–0.7)
Eosinophils Relative: 0 %
HCT: 41 % (ref 36.0–46.0)
Hemoglobin: 13 g/dL (ref 12.0–15.0)
Lymphocytes Relative: 19 %
Lymphs Abs: 1.6 10*3/uL (ref 0.7–4.0)
MCH: 28.3 pg (ref 26.0–34.0)
MCHC: 31.7 g/dL (ref 30.0–36.0)
MCV: 89.3 fL (ref 78.0–100.0)
Monocytes Absolute: 0.4 10*3/uL (ref 0.1–1.0)
Monocytes Relative: 5 %
Neutro Abs: 6.1 10*3/uL (ref 1.7–7.7)
Neutrophils Relative %: 76 %
Platelets: 239 10*3/uL (ref 150–400)
RBC: 4.59 MIL/uL (ref 3.87–5.11)
RDW: 14.9 % (ref 11.5–15.5)
WBC: 8 10*3/uL (ref 4.0–10.5)

## 2015-04-09 LAB — I-STAT CG4 LACTIC ACID, ED: Lactic Acid, Venous: 0.84 mmol/L (ref 0.5–2.0)

## 2015-04-09 LAB — BASIC METABOLIC PANEL
Anion gap: 9 (ref 5–15)
BUN: 14 mg/dL (ref 6–20)
CO2: 24 mmol/L (ref 22–32)
Calcium: 9.4 mg/dL (ref 8.9–10.3)
Chloride: 106 mmol/L (ref 101–111)
Creatinine, Ser: 0.7 mg/dL (ref 0.44–1.00)
GFR calc Af Amer: >60 mL/min (ref >60)
GFR calc non Af Amer: >60 mL/min (ref >60)
Glucose, Bld: 141 mg/dL — ABNORMAL HIGH (ref 65–99)
Potassium: 3.2 mmol/L — ABNORMAL LOW (ref 3.5–5.1)
Sodium: 139 mmol/L (ref 135–145)

## 2015-04-09 MED ORDER — TRAMADOL HCL 50 MG PO TABS: 50.0000 mg | ORAL_TABLET | Freq: Four times a day (QID) | ORAL | Status: DC | PRN

## 2015-04-09 MED ORDER — TRAMADOL HCL 50 MG PO TABS
100.0000 mg | ORAL_TABLET | Freq: Once | ORAL | Status: AC
  Administered 2015-04-09: 100 mg via ORAL
  Filled 2015-04-09: qty 2

## 2015-04-09 NOTE — ED Notes (Signed)
Patient transported to CT 

## 2015-04-09 NOTE — ED Notes (Addendum)
Pt arrives from home via [facility] c/o weakness with nausea.  Pt denies vomiting, reports diarrhea.  Pt's daughter reports LKW 2100.    Pt reports  Lower abdominal pain tender to palpation. EMS reports pt received 4mg  Zofran IM enroute.

## 2015-04-09 NOTE — ED Provider Notes (Signed)
CSN: [id number]     Arrival date & time 04/09/15  1255 History   First MD Initiated Contact with Patient 04/09/15 1400     Chief Complaint  Patient presents with  . Weakness  . Nausea     (Consider location/radiation/quality/duration/timing/severity/associated sxs/prior Treatment) HPI Comments: She comes in today for headache and LLQ abdominal pain. She reports her HA started this morning and is 10/10. She denies any previous similar headaches. She is on coumadin but denies falls or head injury. She denies any new speech changes or numbness or weakness in her extremities. She reports some "dizziness" with her headache but is unable to further clarify. She associates her dizziness with her medications; has been taking ultram for her abdominal pain.  She reports left lower abdominal pain for the past several months. She is poor historian and is unable to give many details. She reports associated diarrhea but is unable to further characterize. She denies fevers, chills, dysuria,  hematochezia or melena. He reports history of C-section and renal transplant. Initially she says pain worse with eating but then reports nothing makes pain better or worse. She reports pain evaluated in the ED were previously as well as by her primary care, rhuematologist and gastroenterology at [facility] without determining a cause.   Patient is a 54 y.o. female presenting with abdominal pain. The history is provided by the patient.  Abdominal Pain Pain location:  LLQ Pain quality: aching and sharp   Pain radiates to:  Does not radiate Pain severity:  Moderate Onset quality:  Unable to specify Duration:  20 weeks Timing:  Constant Progression:  Unchanged Context: not alcohol use, not diet changes, not eating, not previous surgeries, not recent illness, not retching, not sick contacts and not trauma   Worsened by:  Nothing tried Ineffective treatments: ultram. Associated symptoms: diarrhea, fatigue and nausea   Associated  symptoms: no anorexia, no chest pain, no chills, no constipation, no cough, no dysuria, no fever, no hematochezia, no melena, no shortness of breath and no vomiting   Diarrhea:    Quality:  Unable to specify   Severity:  Unable to specify   Progression:  Unchanged   Past Medical History  Diagnosis Date  . THYROID NODULE, LEFT 04/10/2009  . HYPERLIPIDEMIA 08/21/2007  . GOUT 08/21/2007  . HYPERTENSION 08/21/2007    Dr. Adams, [facility]  . CONGESTIVE HEART FAILURE 08/21/2007  . CEREBROVASCULAR ACCIDENT, ACUTE 04/15/2010  . GERD 08/21/2007  . RENAL INSUFFICIENCY 08/21/2007  . LUPUS 08/21/2007  . OSTEOPOROSIS 08/21/2007    Rheumatol at [facility]  . DVT, HX OF 08/21/2007  . CLOSTRIDIUM DIFFICILE COLITIS, HX OF 08/21/2007  . KIDNEY TRANSPLANTATION, HX OF 08/22/2007    s/p renal transplant-Dr. Adams, [facility]  . Pulmonary embolism (HCC) 07/16/2010  . Renal failure   . Current use of long term anticoagulation     Dr. Adams, [facility]  . Depression     Dr. Adams, [facility]  . History of stroke with residual effects   . Right sided weakness   . CVA 04/17/2010  . Tachycardia   . DIABETES MELLITUS, TYPE II 08/21/2007  . Candida esophagitis (HCC) 11/12/2014  . Steroid-induced hyperglycemia 11/09/2014   Past Surgical History  Procedure Laterality Date  . Cholecystectomy    . Tubal ligation    . Kidney transplant Right 2009  . Renal biopsy, open  1981  . Enteroscopy N/A 11/11/2014    Procedure: ENTEROSCOPY;  Surgeon: Malcolm T Stark, MD;  Location: [facility];  Service: Endoscopy;    Laterality: N/A;   Family History  Problem Relation Age of Onset  . Heart attack Mother   . Heart disease Father   . Asthma Sister   . Asthma Sister   . Asthma Daughter   . Cancer Maternal Grandfather     prostate  . Cancer Paternal Grandfather     colon   Social History  Substance Use Topics  . Smoking status: Never Smoker   . Smokeless tobacco: Never Used  . Alcohol Use: No   OB History    No data available      Review of Systems  Constitutional: Positive for appetite change and fatigue. Negative for fever and chills.  Respiratory: Negative for cough, chest tightness and shortness of breath.   Cardiovascular: Negative for chest pain.  Gastrointestinal: Positive for nausea, abdominal pain and diarrhea. Negative for vomiting, constipation, blood in stool, melena, hematochezia and anorexia.  Genitourinary: Negative for dysuria, flank pain and difficulty urinating.      Allergies  Oxycodone-acetaminophen; Propoxyphene n-acetaminophen; Sulfonamide derivatives; Codeine; Other; Gabapentin; Latex; Metoprolol; and Morphine and related  Home Medications   Prior to Admission medications   Medication Sig Start Date End Date Taking? Authorizing Provider  alendronate (FOSAMAX) 70 MG tablet Take 70 mg by mouth once a week. Take on Saturdays with a full glass of water on an empty stomach.   Yes Historical Provider, MD  ammonium lactate (AMLACTIN) 12 % cream Apply topically as needed for dry skin.   Yes Historical Provider, MD  atorvastatin (LIPITOR) 40 MG tablet Take 40 mg by mouth daily. 03/21/15  Yes Historical Provider, MD  calcitRIOL (ROCALTROL) 0.25 MCG capsule TAKE ONE CAPSULE BY MOUTH DAILY 02/11/15  Yes Renato Shin, MD  diltiazem (TIAZAC) 240 MG 24 hr capsule Take 1 capsule (240 mg total) by mouth daily. 10/04/14  Yes Renato Shin, MD  esomeprazole (NEXIUM) 20 MG capsule Take 20 mg by mouth daily. 03/21/15  Yes Historical Provider, MD  losartan (COZAAR) 100 MG tablet Take 1 tablet (100 mg total) by mouth daily. 02/06/15  Yes Renato Shin, MD  Multiple Vitamin (MULTIVITAMIN WITH MINERALS) TABS tablet Take 1 tablet by mouth daily.   Yes Historical Provider, MD  mycophenolate (CELLCEPT) 250 MG capsule Take 250 mg by mouth 2 (two) times daily.   Yes Historical Provider, MD  nystatin (MYCOSTATIN) powder Apply 1 g topically 4 (four) times daily as needed. For yeast under breast   Yes Historical Provider, MD   oxybutynin (DITROPAN) 5 MG tablet Take 5 mg by mouth daily as needed for bladder spasms.    Yes Historical Provider, MD  predniSONE (DELTASONE) 5 MG tablet Take 5 mg by mouth daily with breakfast.   Yes Historical Provider, MD  sertraline (ZOLOFT) 50 MG tablet Take 1 tablet (50 mg total) by mouth daily. 03/13/15  Yes Renato Shin, MD  tacrolimus (PROGRAF) 1 MG capsule Take 1 mg by mouth 2 (two) times daily.  04/16/14  Yes Historical Provider, MD  triamcinolone (KENALOG) 0.025 % cream Apply 1 application topically daily as needed (dry skin).    Yes Historical Provider, MD  warfarin (COUMADIN) 2.5 MG tablet Take 2.5 mg by mouth as directed.    Yes Historical Provider, MD  Blood Glucose Monitoring Suppl (ONETOUCH VERIO IQ SYSTEM) W/DEVICE KIT Use to check blood sugar 1 time per day 02/12/15   Renato Shin, MD  glucose blood (ONE TOUCH ULTRA TEST) test strip Use to check blood sugar 1 time per day 02/12/15   Renato Shin,  MD  HYDROcodone-acetaminophen (NORCO/VICODIN) 5-325 MG tablet Take 1-2 tablets by mouth every 4 (four) hours as needed for moderate pain. Patient not taking: Reported on 04/09/2015 03/27/15   Christopher J Pollina, MD  ondansetron (ZOFRAN ODT) 4 MG disintegrating tablet 4mg ODT q4 hours prn nausea/vomiting Patient taking differently: Take 4 mg by mouth every 8 (eight) hours as needed for nausea.  06/28/14   Scott Goldston, MD  ONETOUCH DELICA LANCETS 33G MISC Use to check blood sugar 1 time per day 02/12/15   Sean Ellison, MD  traMADol (ULTRAM) 50 MG tablet Take 1 tablet (50 mg total) by mouth every 6 (six) hours as needed for moderate pain. Patient not taking: Reported on 04/09/2015 04/01/15   Sean Ellison, MD  warfarin (COUMADIN) 5 MG tablet Take 1 tablet (5 mg total) by mouth daily. Patient not taking: Reported on 04/09/2015 02/22/15   Belkys A Regalado, MD   BP 134/81 mmHg  Pulse 91  Temp(Src) 98.1 F (36.7 C) (Oral)  Resp 12  SpO2 98% Physical Exam  Constitutional: She is oriented  to person, place, and time. She appears well-developed and well-nourished. No distress.  Eyes: EOM are normal. Pupils are equal, round, and reactive to light.  Neck: Normal range of motion. Neck supple. No thyromegaly present.  Cardiovascular: Normal rate, regular rhythm and normal heart sounds.   No murmur heard. Pulmonary/Chest: Effort normal. No respiratory distress.  Abdominal: Soft. Bowel sounds are normal. She exhibits no distension and no mass. There is tenderness. There is no rebound and no guarding.  Tenderness in suprapubic and LLQ.   Lymphadenopathy:    She has no cervical adenopathy.  Neurological: She is alert and oriented to person, place, and time. No cranial nerve deficit.  She has some word finding difficulties and left upper and lower extremity weakness that is residual from her previous stroke and at baseline per her daughter. She is able to walk unassisted.  Skin: Skin is warm.    ED Course  Procedures (including critical care time) Labs Review Labs Reviewed  BASIC METABOLIC PANEL - Abnormal; Notable for the following:    Potassium 3.2 (*)    Glucose, Bld 141 (*)    All other components within normal limits  CBC WITH DIFFERENTIAL/PLATELET  URINALYSIS, ROUTINE W REFLEX MICROSCOPIC (NOT AT [facility])  I-STAT CG4 LACTIC ACID, ED    Imaging Review Ct Head Wo Contrast  04/09/2015  CLINICAL DATA:  Weakness and nausea EXAM: CT HEAD WITHOUT CONTRAST TECHNIQUE: Contiguous axial images were obtained from the base of the skull through the vertex without intravenous contrast. COMPARISON:  09/14/2014 FINDINGS: Encephalomalacia in the left frontal and parietal lobes with ex vacuo dilatation of the left lateral ventricle is stable. Chronic infarcts in the bilateral cerebellar hemispheres are stable. There is superimposed global atrophy and chronic ischemic changes in the periventricular white matter. There is no mass effect, midline shift, or acute hemorrhage. Mastoid air cells are  clear. Visualized paranasal sinuses are clear. No evidence of skull fracture. IMPRESSION: Chronic ischemic changes and atrophy are re- demonstrated. No acute intracranial pathology. Electronically Signed   By: Arthur  Hoss M.D.   On: 04/09/2015 15:41   I have personally reviewed and evaluated these images and lab results as part of my medical decision-making.   EKG Interpretation   Date/Time:  Wednesday April 09 2015 13:00:30 EDT Ventricular Rate:  86 PR Interval:  169 QRS Duration: 104 QT Interval:  407 QTC Calculation: 487 R Axis:   -50 Text Interpretation:    Sinus rhythm Ventricular premature complex Probable  left atrial enlargement LAD, consider left anterior fascicular block  Abnormal R-wave progression, late transition Left ventricular hypertrophy  Borderline prolonged QT interval No significant change since last tracing  Confirmed by JACUBOWITZ  MD, SAM ([id number]) on 04/09/2015 3:01:37 PM      MDM   Final diagnoses:  Headache    54-year-old female with multiple medical problems comes in for evaluation of headache and chronic abdominal pain.  She reports the reason for her visit today is her headache.  Headache is 10 out of 10.  She denies any previous similar headaches.  She is on Coumadin, but denies any recent falls or head trauma.  Due to acute onset of severe headache without history of previous similar headaches and currently on anticoagulation a CT scan was obtained and negative for acute bleed. Her headache improved with ultram and she was discharged to follow-up with her PCP.   Chronic abdominal pain for several months that is currently being evaluated by her PCP, rheumatologist and gastroenterologist at [facility]. Laboratory evaluation with CBC, BMET and lactic acid were normal. She had no guarding or rebounding to indicate an acute abdomen. She had no bowel movements while in the emergency department. She was discharged to follow-up with her gastroenterologist for ongoing  evaluation of her stomach pain.     R , MD 04/09/15 1600   R , MD 04/09/15 1609  Sam Jacubowitz, MD 04/09/15 1625 

## 2015-04-09 NOTE — Discharge Instructions (Signed)
Your head CT scan was negative for any bleeding and was blood work was negative for infection or other cause of your abdominal pain. We recommend you follow-up with your primary care physician and gastroenterologist for continued evaluation of your stomach pain.   Analgesic Rebound Headaches An analgesic rebound headache is a headache that returns after pain medicine (analgesic) that was taken to treat the initial headache wears off. People who suffer from tension, migraine, or cluster headaches are at risk for developing rebound headaches. Any type of primary headache can return as a rebound headache if you regularly take analgesics more than three times a week. If the cycle of rebound headaches continues, they become chronic daily headaches.  CAUSES Analgesics frequently associated with this problem include common over-the-counter medicines like aspirin, ibuprofen, acetaminophen, sinus relief medicines, and other medicines that contain caffeine. Narcotic pain medicines are also a common cause of rebound headaches.  SIGNS AND SYMPTOMS The symptoms of rebound headaches are the same as the symptoms of your initial headache. Symptoms of specific types of headaches include: Tension headache  Pressure around the head.  Dull, aching head pain.  Pain felt over the front and sides of the head.  Tenderness in the muscles of the head, neck and shoulders. Migraine Headache  Pulsing or throbbing pain on one or both sides of the head.  Severe pain that interferes with daily activities.  Pain that is worsened by physical activity.  Nausea, vomiting, or both.  Pain with exposure to bright light, loud noises, or strong smells.  General sensitivity to bright light, loud noises, or strong smells.  Visual changes.  Numbness of one or both arms. Cluster Headaches  Severe pain that begins in or around one eye or temple.  Redness in the eye on the same side as the pain.  Droopy or swollen  eyelid.  One-sided head pain.  Nausea.  Runny nose.  Sweaty, pale facial skin.  Restlessness. DIAGNOSIS  Analgesic rebound headaches are diagnosed by reviewing your medical history. This includes the nature of your initial headaches, as well as the type of pain medicines you have been using to treat your headaches and how often you take them. TREATMENT Discontinuing frequent use of the analgesic medicine will typically reduce the frequency of the rebound episodes. This may initially worsen your headaches but eventually the pain should become more manageable, less frequent, and less severe.  Seeing a headache specialists may helpful. He or she may be able to help you manage your headaches and to make sure there is not another cause of the headaches. Alternative methods of stress relief such as acupuncture, counseling, biofeedback, and massage may also be helpful. Talk with your health care provider about which alternative treatments might be good for you. HOME CARE INSTRUCTIONS Stopping the regular use of pain medicine can be difficult. Follow your health care provider's instructions carefully. Keep all of your appointments. Avoid triggers that are known to cause your primary headaches. SEEK MEDICAL CARE IF: You continue to experience headaches after following your health care provider's recommended treatments. SEEK IMMEDIATE MEDICAL CARE IF:  You develop new headache pain.  You develop headache pain that is different than what you have experienced in the past.  You develop numbness or tingling in your arms or legs.  You develop changes in your speech or vision. MAKE SURE YOU:  Understand these instructions.  Will watch your child's condition.  Will get help right away if your child is not doing well or gets  worse.   This information is not intended to replace advice given to you by your health care provider. Make sure you discuss any questions you have with your health care  provider.   Document Released: 08/14/2003 Document Revised: 06/14/2014 Document Reviewed: 12/07/2012 Elsevier Interactive Patient Education [employer].

## 2015-04-09 NOTE — ED Provider Notes (Addendum)
Planes of lower abdominal pain for one month accompanied by vomiting and diarrhea daily. She presents with headache at the top of her head, gradual onset onset today. Patient is alert and nontoxic abdomen nontender normal active bowel sounds. Neurologic moves all extremities pain on nurse 2 through 12 grossly intact walks unassisted.  Orlie Dakin, MD 04/09/15 1523 Patient presents today as headache is new. Abdominal pain is chronic, and GI symptoms are unchanged  Orlie Dakin, MD 04/09/15 1624

## 2015-04-10 DIAGNOSIS — M150 Primary generalized (osteo)arthritis: Secondary | ICD-10-CM | POA: Diagnosis not present

## 2015-04-11 ENCOUNTER — Ambulatory Visit (INDEPENDENT_AMBULATORY_CARE_PROVIDER_SITE_OTHER): Payer: Medicare Other | Admitting: Internal Medicine

## 2015-04-11 DIAGNOSIS — I5032 Chronic diastolic (congestive) heart failure: Secondary | ICD-10-CM | POA: Diagnosis not present

## 2015-04-11 DIAGNOSIS — Z7901 Long term (current) use of anticoagulants: Secondary | ICD-10-CM | POA: Diagnosis not present

## 2015-04-11 DIAGNOSIS — I69351 Hemiplegia and hemiparesis following cerebral infarction affecting right dominant side: Secondary | ICD-10-CM | POA: Diagnosis not present

## 2015-04-11 DIAGNOSIS — Z7952 Long term (current) use of systemic steroids: Secondary | ICD-10-CM | POA: Diagnosis not present

## 2015-04-11 DIAGNOSIS — Z86718 Personal history of other venous thrombosis and embolism: Secondary | ICD-10-CM | POA: Diagnosis not present

## 2015-04-11 DIAGNOSIS — A047 Enterocolitis due to Clostridium difficile: Secondary | ICD-10-CM | POA: Diagnosis not present

## 2015-04-11 DIAGNOSIS — N189 Chronic kidney disease, unspecified: Secondary | ICD-10-CM | POA: Diagnosis not present

## 2015-04-11 DIAGNOSIS — F329 Major depressive disorder, single episode, unspecified: Secondary | ICD-10-CM | POA: Diagnosis not present

## 2015-04-11 DIAGNOSIS — I129 Hypertensive chronic kidney disease with stage 1 through stage 4 chronic kidney disease, or unspecified chronic kidney disease: Secondary | ICD-10-CM | POA: Diagnosis not present

## 2015-04-11 DIAGNOSIS — M150 Primary generalized (osteo)arthritis: Secondary | ICD-10-CM | POA: Diagnosis not present

## 2015-04-11 DIAGNOSIS — Z792 Long term (current) use of antibiotics: Secondary | ICD-10-CM | POA: Diagnosis not present

## 2015-04-11 DIAGNOSIS — Z5181 Encounter for therapeutic drug level monitoring: Secondary | ICD-10-CM | POA: Diagnosis not present

## 2015-04-11 DIAGNOSIS — M329 Systemic lupus erythematosus, unspecified: Secondary | ICD-10-CM | POA: Diagnosis not present

## 2015-04-11 DIAGNOSIS — I6350 Cerebral infarction due to unspecified occlusion or stenosis of unspecified cerebral artery: Secondary | ICD-10-CM

## 2015-04-11 DIAGNOSIS — E1122 Type 2 diabetes mellitus with diabetic chronic kidney disease: Secondary | ICD-10-CM | POA: Diagnosis not present

## 2015-04-11 LAB — POCT INR: INR: 4.6

## 2015-04-12 DIAGNOSIS — M150 Primary generalized (osteo)arthritis: Secondary | ICD-10-CM | POA: Diagnosis not present

## 2015-04-13 DIAGNOSIS — M150 Primary generalized (osteo)arthritis: Secondary | ICD-10-CM | POA: Diagnosis not present

## 2015-04-14 ENCOUNTER — Ambulatory Visit: Payer: Medicare Other | Admitting: Endocrinology

## 2015-04-14 DIAGNOSIS — M150 Primary generalized (osteo)arthritis: Secondary | ICD-10-CM | POA: Diagnosis not present

## 2015-04-14 DIAGNOSIS — Z0289 Encounter for other administrative examinations: Secondary | ICD-10-CM

## 2015-04-15 DIAGNOSIS — M150 Primary generalized (osteo)arthritis: Secondary | ICD-10-CM | POA: Diagnosis not present

## 2015-04-16 DIAGNOSIS — M150 Primary generalized (osteo)arthritis: Secondary | ICD-10-CM | POA: Diagnosis not present

## 2015-04-17 DIAGNOSIS — M150 Primary generalized (osteo)arthritis: Secondary | ICD-10-CM | POA: Diagnosis not present

## 2015-04-18 ENCOUNTER — Ambulatory Visit (INDEPENDENT_AMBULATORY_CARE_PROVIDER_SITE_OTHER): Payer: Medicare Other | Admitting: Cardiovascular Disease

## 2015-04-18 DIAGNOSIS — F329 Major depressive disorder, single episode, unspecified: Secondary | ICD-10-CM | POA: Diagnosis not present

## 2015-04-18 DIAGNOSIS — I5032 Chronic diastolic (congestive) heart failure: Secondary | ICD-10-CM | POA: Diagnosis not present

## 2015-04-18 DIAGNOSIS — I129 Hypertensive chronic kidney disease with stage 1 through stage 4 chronic kidney disease, or unspecified chronic kidney disease: Secondary | ICD-10-CM | POA: Diagnosis not present

## 2015-04-18 DIAGNOSIS — M150 Primary generalized (osteo)arthritis: Secondary | ICD-10-CM | POA: Diagnosis not present

## 2015-04-18 DIAGNOSIS — E1122 Type 2 diabetes mellitus with diabetic chronic kidney disease: Secondary | ICD-10-CM | POA: Diagnosis not present

## 2015-04-18 DIAGNOSIS — Z7952 Long term (current) use of systemic steroids: Secondary | ICD-10-CM | POA: Diagnosis not present

## 2015-04-18 DIAGNOSIS — M329 Systemic lupus erythematosus, unspecified: Secondary | ICD-10-CM | POA: Diagnosis not present

## 2015-04-18 DIAGNOSIS — I69351 Hemiplegia and hemiparesis following cerebral infarction affecting right dominant side: Secondary | ICD-10-CM | POA: Diagnosis not present

## 2015-04-18 DIAGNOSIS — Z86718 Personal history of other venous thrombosis and embolism: Secondary | ICD-10-CM | POA: Diagnosis not present

## 2015-04-18 DIAGNOSIS — A047 Enterocolitis due to Clostridium difficile: Secondary | ICD-10-CM | POA: Diagnosis not present

## 2015-04-18 DIAGNOSIS — Z5181 Encounter for therapeutic drug level monitoring: Secondary | ICD-10-CM | POA: Diagnosis not present

## 2015-04-18 DIAGNOSIS — N189 Chronic kidney disease, unspecified: Secondary | ICD-10-CM | POA: Diagnosis not present

## 2015-04-18 DIAGNOSIS — Z7901 Long term (current) use of anticoagulants: Secondary | ICD-10-CM | POA: Diagnosis not present

## 2015-04-18 DIAGNOSIS — I6350 Cerebral infarction due to unspecified occlusion or stenosis of unspecified cerebral artery: Secondary | ICD-10-CM

## 2015-04-18 DIAGNOSIS — Z792 Long term (current) use of antibiotics: Secondary | ICD-10-CM | POA: Diagnosis not present

## 2015-04-18 LAB — POCT INR: INR: 1.8

## 2015-04-19 DIAGNOSIS — M150 Primary generalized (osteo)arthritis: Secondary | ICD-10-CM | POA: Diagnosis not present

## 2015-04-20 DIAGNOSIS — M150 Primary generalized (osteo)arthritis: Secondary | ICD-10-CM | POA: Diagnosis not present

## 2015-04-21 DIAGNOSIS — M150 Primary generalized (osteo)arthritis: Secondary | ICD-10-CM | POA: Diagnosis not present

## 2015-04-22 DIAGNOSIS — M150 Primary generalized (osteo)arthritis: Secondary | ICD-10-CM | POA: Diagnosis not present

## 2015-04-23 DIAGNOSIS — M150 Primary generalized (osteo)arthritis: Secondary | ICD-10-CM | POA: Diagnosis not present

## 2015-04-24 DIAGNOSIS — M150 Primary generalized (osteo)arthritis: Secondary | ICD-10-CM | POA: Diagnosis not present

## 2015-04-25 ENCOUNTER — Ambulatory Visit (INDEPENDENT_AMBULATORY_CARE_PROVIDER_SITE_OTHER): Payer: Medicare Other | Admitting: Internal Medicine

## 2015-04-25 DIAGNOSIS — F329 Major depressive disorder, single episode, unspecified: Secondary | ICD-10-CM | POA: Diagnosis not present

## 2015-04-25 DIAGNOSIS — I69351 Hemiplegia and hemiparesis following cerebral infarction affecting right dominant side: Secondary | ICD-10-CM | POA: Diagnosis not present

## 2015-04-25 DIAGNOSIS — N189 Chronic kidney disease, unspecified: Secondary | ICD-10-CM | POA: Diagnosis not present

## 2015-04-25 DIAGNOSIS — Z5181 Encounter for therapeutic drug level monitoring: Secondary | ICD-10-CM | POA: Diagnosis not present

## 2015-04-25 DIAGNOSIS — Z86718 Personal history of other venous thrombosis and embolism: Secondary | ICD-10-CM | POA: Diagnosis not present

## 2015-04-25 DIAGNOSIS — E1122 Type 2 diabetes mellitus with diabetic chronic kidney disease: Secondary | ICD-10-CM | POA: Diagnosis not present

## 2015-04-25 DIAGNOSIS — A047 Enterocolitis due to Clostridium difficile: Secondary | ICD-10-CM | POA: Diagnosis not present

## 2015-04-25 DIAGNOSIS — M329 Systemic lupus erythematosus, unspecified: Secondary | ICD-10-CM | POA: Diagnosis not present

## 2015-04-25 DIAGNOSIS — I129 Hypertensive chronic kidney disease with stage 1 through stage 4 chronic kidney disease, or unspecified chronic kidney disease: Secondary | ICD-10-CM | POA: Diagnosis not present

## 2015-04-25 DIAGNOSIS — M150 Primary generalized (osteo)arthritis: Secondary | ICD-10-CM | POA: Diagnosis not present

## 2015-04-25 DIAGNOSIS — I5032 Chronic diastolic (congestive) heart failure: Secondary | ICD-10-CM | POA: Diagnosis not present

## 2015-04-25 DIAGNOSIS — Z7952 Long term (current) use of systemic steroids: Secondary | ICD-10-CM | POA: Diagnosis not present

## 2015-04-25 DIAGNOSIS — Z792 Long term (current) use of antibiotics: Secondary | ICD-10-CM | POA: Diagnosis not present

## 2015-04-25 DIAGNOSIS — Z7901 Long term (current) use of anticoagulants: Secondary | ICD-10-CM | POA: Diagnosis not present

## 2015-04-25 DIAGNOSIS — I6350 Cerebral infarction due to unspecified occlusion or stenosis of unspecified cerebral artery: Secondary | ICD-10-CM

## 2015-04-25 LAB — POCT INR: INR: 2.5

## 2015-04-26 DIAGNOSIS — M150 Primary generalized (osteo)arthritis: Secondary | ICD-10-CM | POA: Diagnosis not present

## 2015-04-27 DIAGNOSIS — M150 Primary generalized (osteo)arthritis: Secondary | ICD-10-CM | POA: Diagnosis not present

## 2015-04-29 ENCOUNTER — Other Ambulatory Visit: Payer: Self-pay

## 2015-04-29 DIAGNOSIS — N189 Chronic kidney disease, unspecified: Secondary | ICD-10-CM | POA: Diagnosis not present

## 2015-04-29 DIAGNOSIS — I129 Hypertensive chronic kidney disease with stage 1 through stage 4 chronic kidney disease, or unspecified chronic kidney disease: Secondary | ICD-10-CM | POA: Diagnosis not present

## 2015-04-29 DIAGNOSIS — Z792 Long term (current) use of antibiotics: Secondary | ICD-10-CM | POA: Diagnosis not present

## 2015-04-29 DIAGNOSIS — M329 Systemic lupus erythematosus, unspecified: Secondary | ICD-10-CM | POA: Diagnosis not present

## 2015-04-29 DIAGNOSIS — E1122 Type 2 diabetes mellitus with diabetic chronic kidney disease: Secondary | ICD-10-CM | POA: Diagnosis not present

## 2015-04-29 DIAGNOSIS — Z7901 Long term (current) use of anticoagulants: Secondary | ICD-10-CM | POA: Diagnosis not present

## 2015-04-29 DIAGNOSIS — A047 Enterocolitis due to Clostridium difficile: Secondary | ICD-10-CM | POA: Diagnosis not present

## 2015-04-29 DIAGNOSIS — Z86718 Personal history of other venous thrombosis and embolism: Secondary | ICD-10-CM | POA: Diagnosis not present

## 2015-04-29 DIAGNOSIS — Z7952 Long term (current) use of systemic steroids: Secondary | ICD-10-CM | POA: Diagnosis not present

## 2015-04-29 DIAGNOSIS — F329 Major depressive disorder, single episode, unspecified: Secondary | ICD-10-CM | POA: Diagnosis not present

## 2015-04-29 DIAGNOSIS — I5032 Chronic diastolic (congestive) heart failure: Secondary | ICD-10-CM | POA: Diagnosis not present

## 2015-04-29 DIAGNOSIS — I69351 Hemiplegia and hemiparesis following cerebral infarction affecting right dominant side: Secondary | ICD-10-CM | POA: Diagnosis not present

## 2015-04-29 DIAGNOSIS — Z5181 Encounter for therapeutic drug level monitoring: Secondary | ICD-10-CM | POA: Diagnosis not present

## 2015-04-29 MED ORDER — LOSARTAN POTASSIUM 100 MG PO TABS: 100.0000 mg | ORAL_TABLET | Freq: Every day | ORAL | Status: DC

## 2015-05-05 ENCOUNTER — Ambulatory Visit (INDEPENDENT_AMBULATORY_CARE_PROVIDER_SITE_OTHER): Payer: Medicare Other | Admitting: Internal Medicine

## 2015-05-05 DIAGNOSIS — I5032 Chronic diastolic (congestive) heart failure: Secondary | ICD-10-CM | POA: Diagnosis not present

## 2015-05-05 DIAGNOSIS — Z86718 Personal history of other venous thrombosis and embolism: Secondary | ICD-10-CM | POA: Diagnosis not present

## 2015-05-05 DIAGNOSIS — I6350 Cerebral infarction due to unspecified occlusion or stenosis of unspecified cerebral artery: Secondary | ICD-10-CM

## 2015-05-05 DIAGNOSIS — F329 Major depressive disorder, single episode, unspecified: Secondary | ICD-10-CM | POA: Diagnosis not present

## 2015-05-05 DIAGNOSIS — Z79891 Long term (current) use of opiate analgesic: Secondary | ICD-10-CM | POA: Diagnosis not present

## 2015-05-05 DIAGNOSIS — Z7901 Long term (current) use of anticoagulants: Secondary | ICD-10-CM | POA: Diagnosis not present

## 2015-05-05 DIAGNOSIS — Z5181 Encounter for therapeutic drug level monitoring: Secondary | ICD-10-CM | POA: Diagnosis not present

## 2015-05-05 DIAGNOSIS — I130 Hypertensive heart and chronic kidney disease with heart failure and stage 1 through stage 4 chronic kidney disease, or unspecified chronic kidney disease: Secondary | ICD-10-CM | POA: Diagnosis not present

## 2015-05-05 DIAGNOSIS — N189 Chronic kidney disease, unspecified: Secondary | ICD-10-CM | POA: Diagnosis not present

## 2015-05-05 DIAGNOSIS — Z7952 Long term (current) use of systemic steroids: Secondary | ICD-10-CM | POA: Diagnosis not present

## 2015-05-05 DIAGNOSIS — M329 Systemic lupus erythematosus, unspecified: Secondary | ICD-10-CM | POA: Diagnosis not present

## 2015-05-05 DIAGNOSIS — E1122 Type 2 diabetes mellitus with diabetic chronic kidney disease: Secondary | ICD-10-CM | POA: Diagnosis not present

## 2015-05-05 DIAGNOSIS — I69351 Hemiplegia and hemiparesis following cerebral infarction affecting right dominant side: Secondary | ICD-10-CM | POA: Diagnosis not present

## 2015-05-05 DIAGNOSIS — Z940 Kidney transplant status: Secondary | ICD-10-CM | POA: Diagnosis not present

## 2015-05-05 LAB — POCT INR: INR: 1.8

## 2015-05-08 ENCOUNTER — Encounter: Payer: Self-pay | Admitting: Neurology

## 2015-05-08 ENCOUNTER — Other Ambulatory Visit (INDEPENDENT_AMBULATORY_CARE_PROVIDER_SITE_OTHER): Payer: Medicare Other

## 2015-05-08 ENCOUNTER — Ambulatory Visit (INDEPENDENT_AMBULATORY_CARE_PROVIDER_SITE_OTHER): Payer: Medicare Other | Admitting: Neurology

## 2015-05-08 VITALS — BP 146/86 | HR 116 | Wt 151.0 lb

## 2015-05-08 DIAGNOSIS — F0151 Vascular dementia with behavioral disturbance: Secondary | ICD-10-CM

## 2015-05-08 DIAGNOSIS — I69359 Hemiplegia and hemiparesis following cerebral infarction affecting unspecified side: Secondary | ICD-10-CM | POA: Insufficient documentation

## 2015-05-08 DIAGNOSIS — F0150 Vascular dementia without behavioral disturbance: Secondary | ICD-10-CM | POA: Insufficient documentation

## 2015-05-08 DIAGNOSIS — E785 Hyperlipidemia, unspecified: Secondary | ICD-10-CM

## 2015-05-08 DIAGNOSIS — I69320 Aphasia following cerebral infarction: Secondary | ICD-10-CM

## 2015-05-08 DIAGNOSIS — E1122 Type 2 diabetes mellitus with diabetic chronic kidney disease: Secondary | ICD-10-CM | POA: Diagnosis not present

## 2015-05-08 DIAGNOSIS — I6789 Other cerebrovascular disease: Secondary | ICD-10-CM

## 2015-05-08 DIAGNOSIS — I10 Essential (primary) hypertension: Secondary | ICD-10-CM

## 2015-05-08 LAB — LIPID PANEL
Cholesterol: 166 mg/dL (ref 0–200)
HDL: 76.8 mg/dL (ref >39.00)
LDL Cholesterol: 71 mg/dL (ref 0–99)
NonHDL: 89.65
Total CHOL/HDL Ratio: 2
Triglycerides: 92 mg/dL (ref 0.0–149.0)
VLDL: 18.4 mg/dL (ref 0.0–40.0)

## 2015-05-08 MED ORDER — DONEPEZIL HCL 10 MG PO TABS: 10.0000 mg | ORAL_TABLET | Freq: Every day | ORAL | Status: DC

## 2015-05-08 MED ORDER — MIRTAZAPINE 15 MG PO TABS: 15.0000 mg | ORAL_TABLET | Freq: Every day | ORAL | Status: DC

## 2015-05-08 NOTE — Patient Instructions (Signed)
1.  Stop sertraline 2.  Start mirtazepine 15mg  at bedtime to help with depression, sleep and appetite 3.  Continue Aricept (donepezil) 10mg  daily 4.  Will check fasting lipid profile and make adjustment to cholesterol medication if needed 5.  Follow up in 9 months.

## 2015-05-08 NOTE — Progress Notes (Signed)
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  Labs reviewed.  She is accompanied by her caregiver who provides some history.  UPDATE: Hgb A1c from October was 6.3. Since last visit, she went to a nursing home for a while but became more defiant, such as purposefully going to the bathroom on the floor.  She is now back to living at home with a 24 hour caregiver.  She is on sertraline 67m but still depressed.  She has poor appetite and has lost over 30 lbs over the past year.  She cannot sleep well and watches TV during the night.  HISTORY She has history of significant cerebrovascular disease and stroke in 2011 and 2013.  She has residual right sided weakness and aphasia.  Last available MRI of the head from 06/14/13 showed remote left MCA infarct but no acute findings.  MRA of the head showed atherosclerotic changes in the anterior and posterior circulation with partial recanalization of the left MCA.  For 2 years, she has had some memory problems.  For example, she would often forget having just seen her children from time to time.  She also expresses that she feels people don't care about her.  She does report depression.  She still lives by herself.  She does not drive.  Since her stroke, she requires assistance in regards to her ADLs.  A home health aid helps her with dressing and bathing.  She has a pill box but she does not appear to be compliant with all of her medications, particularly the Lipitor.  Her daughter handles her finances.  She does not have problems recalling names or faces of people she knows (except once  regarding a nephew in a picture). PAST MEDICAL HISTORY: Past Medical History  Diagnosis Date  . THYROID NODULE, LEFT 04/10/2009  . HYPERLIPIDEMIA 08/21/2007  . GOUT 08/21/2007  . HYPERTENSION 08/21/2007    Dr. [place]ree [facility] [place]  . CEREBROVASCULAR ACCIDENT, ACUTE 04/15/2010  . GERD 08/21/2007  . RENAL INSUFFICIENCY 08/21/2007  . LUPUS 08/21/2007  . OSTEOPOROSIS 08/21/2007    [facility] at [facility]  . DVT, HX OF 08/21/2007  . CLOSTRIDIUM DIFFICILE COLITIS, HX OF 08/21/2007  . KIDNEY TRANSPLANTATION, HX OF 08/22/2007    [facility] . [place]  . Renal failure   . Current use of long term anticoagulation     [facility] . [facility] . History of stroke with residual effects   . Right sided weakness   . CVA 04/17/2010  . Tachycardia   . DIABETES MELLITUS, TYPE II 08/21/2007  . Candida esophagitis ([facility] 11/12/2014  . Steroid-induced hyperglycemia 11/09/2014    MEDICATIONS: Current Outpatient Prescriptions on File Prior to Visit  Medication Sig Dispense Refill  . alendronate (FOSAMAX) 70 MG tablet Take 70 mg by mouth once a week. Take on Saturdays with a full glass of water on an empty stomach.    .Marland Kitchenammonium lactate (AMLACTIN) 12 % cream Apply topically as needed for dry skin.    .Marland Kitchenatorvastatin (LIPITOR) 40 MG tablet Take 40 mg by  mouth daily.    . Blood Glucose Monitoring Suppl (ONETOUCH VERIO IQ SYSTEM) W/DEVICE KIT Use to check blood sugar 1 time per day 1 kit 0  . calcitRIOL (ROCALTROL) 0.25 MCG capsule TAKE ONE CAPSULE BY MOUTH DAILY 30 capsule 0  . diltiazem (TIAZAC) 240 MG 24 hr capsule Take 1 capsule (240 mg total) by mouth daily. 30 capsule 11  . esomeprazole (NEXIUM) 20 MG capsule Take 20 mg by mouth daily.    Marland Kitchen glucose blood (ONE TOUCH ULTRA TEST) test strip Use to check blood sugar 1 time per day 100 each 2  . HYDROcodone-acetaminophen (NORCO/VICODIN) 5-325 MG  tablet Take 1-2 tablets by mouth every 4 (four) hours as needed for moderate pain. 15 tablet 0  . losartan (COZAAR) 100 MG tablet Take 1 tablet (100 mg total) by mouth daily. 30 tablet 3  . Multiple Vitamin (MULTIVITAMIN WITH MINERALS) TABS tablet Take 1 tablet by mouth daily.    . mycophenolate (CELLCEPT) 250 MG capsule Take 250 mg by mouth 2 (two) times daily.    Marland Kitchen nystatin (MYCOSTATIN) powder Apply 1 g topically 4 (four) times daily as needed. For yeast under breast    . ondansetron (ZOFRAN ODT) 4 MG disintegrating tablet 46m ODT q4 hours prn nausea/vomiting (Patient taking differently: Take 4 mg by mouth every 8 (eight) hours as needed for nausea. ) 15 tablet 0  . ONETOUCH DELICA LANCETS 369SMISC Use to check blood sugar 1 time per day 100 each 2  . oxybutynin (DITROPAN) 5 MG tablet Take 5 mg by mouth daily as needed for bladder spasms.     . predniSONE (DELTASONE) 5 MG tablet Take 5 mg by mouth daily with breakfast.    . tacrolimus (PROGRAF) 1 MG capsule Take 1 mg by mouth 2 (two) times daily.     . traMADol (ULTRAM) 50 MG tablet Take 1 tablet (50 mg total) by mouth every 6 (six) hours as needed for moderate pain. 15 tablet 0  . triamcinolone (KENALOG) 0.025 % cream Apply 1 application topically daily as needed (dry skin).     .Marland Kitchenwarfarin (COUMADIN) 2.5 MG tablet Take 2.5 mg by mouth as directed.     . warfarin (COUMADIN) 5 MG tablet Take 1 tablet (5 mg total) by mouth daily. 40 tablet 1   No current facility-administered medications on file prior to visit.    ALLERGIES: Allergies  Allergen Reactions  . Oxycodone-Acetaminophen Shortness Of Breath and Nausea Only  . Propoxyphene N-Acetaminophen Shortness Of Breath and Nausea Only  . Sulfonamide Derivatives Shortness Of Breath and Nausea Only  . Codeine Nausea Only  . Other Other (See Comments)    No blood, Jehovaeh Witness   . Gabapentin Anxiety    twitching  . Latex Rash  . Metoprolol Rash  . Morphine And Related Rash    IV site  on arm is red, patient reports this is improving.  NO shortness of breath reported.    FAMILY HISTORY: Family History  Problem Relation Age of Onset  . Heart attack Mother   . Heart disease Father   . Asthma Sister   . Asthma Sister   . Asthma Daughter   . Cancer Maternal Grandfather     prostate  . Cancer Paternal Grandfather     colon    SOCIAL HISTORY: Social History   Social History  . Marital Status: Divorced    Spouse Name: N/A  . Number of Children: 1  . Years of Education: N/A  Occupational History  . DISABILITY    Social History Main Topics  . Smoking status: Never Smoker   . Smokeless tobacco: Never Used  . Alcohol Use: No  . Drug Use: No  . Sexual Activity: No   Other Topics Concern  . Not on file   Social History Narrative   Retired   Regular exercise-no   Pt completed hs.     REVIEW OF SYSTEMS: Constitutional: No fevers, chills, or sweats, no generalized fatigue, change in appetite Eyes: No visual changes, double vision, eye pain Ear, nose and throat: No hearing loss, ear pain, nasal congestion, sore throat Cardiovascular: No chest pain, palpitations Respiratory:  No shortness of breath at rest or with exertion, wheezes GastrointestinaI: No nausea, vomiting, diarrhea, abdominal pain, fecal incontinence Genitourinary:  No dysuria, urinary retention or frequency Musculoskeletal:  No neck pain, back pain Integumentary: No rash, pruritus, skin lesions Neurological: as above Psychiatric: depression, insomnia Allergic/Immunologic: no itchy/runny eyes, nasal congestion, recent allergic reactions, rashes  PHYSICAL EXAM: Filed Vitals:   05/08/15 0926  BP: 146/86  Pulse: 116   General: No acute distress.   Head:  Normocephalic/atraumatic Eyes:  Fundoscopic exam unremarkable without vessel changes, exudates, hemorrhages or papilledema. Neck: supple, no paraspinal tenderness, full range of motion Heart:  Regular rate and rhythm Lungs:  Clear to  auscultation bilaterally Back: No paraspinal tenderness Neurological Exam: alert and oriented to person, place, and time except day of the week. Attention span and concentration poor, recent memory poor and remote memory intact, fund of knowledge impaired  Speech and language aphasic with some hesitancy in speech output, difficulty naming and following complex commands.  Speech is not dysarthric.   Montreal Cognitive Assessment  05/08/2015 05/29/2014  Visuospatial/ Executive (0/5) 2 3  Naming (0/3) 1 3  Attention: Read list of digits (0/2) 1 0  Attention: Read list of letters (0/1) 1 1  Attention: Serial 7 subtraction starting at 100 (0/3) 0 3  Language: Repeat phrase (0/2) 1 0  Language : Fluency (0/1) 0 0  Abstraction (0/2) 0 1  Delayed Recall (0/5) 0 0  Orientation (0/6) 4 6  Total 10 17  Adjusted Score (based on education) 11 17   right ptosis, reduced facial sensation on right (V1-V3) and mild right lower facial weakness.  Otherwise, CN II-XII intact. Bulk and tone normal, muscle strength 5-/5 right upper extremity and hip flexion.  Otherwise 5/5.  Deep tendon reflexes 2+ throughout, toes downgoing.  Finger to nose and heel to shin testing intact.  Wide-based wobbling gait.  IMPRESSION: Vascular dementia Depression Type II diabetes mellitus Hyperlipidemia Hypertension  PLAN: 1.  To address depression, poor appetite and insomnia, will discontinue sertraline and start mirtazepine 55m at bedtime 2.  Continue Aricept 18mdaily 3.  Continue Lipitor 4074m Will recheck fasting lipid panel (LDL goal should be less than 70) 4.  On anticoagulation 5.  Follow up with PCP for BP recheck 6.  Glycemic and BP control. 7.  24 hour care 8.  Follow up in 9 months.  AdaMetta ClinesO  CC: SeaRenato ShinD

## 2015-05-09 ENCOUNTER — Telehealth: Payer: Self-pay

## 2015-05-09 NOTE — Telephone Encounter (Signed)
-----   Message from Pieter Partridge, DO sent at 05/09/2015  7:08 AM EST ----- I wouldn't make any change to the Lipitor based on results.  It is much better compared to last year.

## 2015-05-09 NOTE — Telephone Encounter (Signed)
Message relayed to daughter.

## 2015-05-15 ENCOUNTER — Ambulatory Visit (INDEPENDENT_AMBULATORY_CARE_PROVIDER_SITE_OTHER): Payer: Medicare Other | Admitting: Cardiovascular Disease

## 2015-05-15 DIAGNOSIS — I130 Hypertensive heart and chronic kidney disease with heart failure and stage 1 through stage 4 chronic kidney disease, or unspecified chronic kidney disease: Secondary | ICD-10-CM | POA: Diagnosis not present

## 2015-05-15 DIAGNOSIS — N189 Chronic kidney disease, unspecified: Secondary | ICD-10-CM | POA: Diagnosis not present

## 2015-05-15 DIAGNOSIS — I69351 Hemiplegia and hemiparesis following cerebral infarction affecting right dominant side: Secondary | ICD-10-CM | POA: Diagnosis not present

## 2015-05-15 DIAGNOSIS — Z86718 Personal history of other venous thrombosis and embolism: Secondary | ICD-10-CM | POA: Diagnosis not present

## 2015-05-15 DIAGNOSIS — M329 Systemic lupus erythematosus, unspecified: Secondary | ICD-10-CM | POA: Diagnosis not present

## 2015-05-15 DIAGNOSIS — I5032 Chronic diastolic (congestive) heart failure: Secondary | ICD-10-CM | POA: Diagnosis not present

## 2015-05-15 DIAGNOSIS — Z7952 Long term (current) use of systemic steroids: Secondary | ICD-10-CM | POA: Diagnosis not present

## 2015-05-15 DIAGNOSIS — Z5181 Encounter for therapeutic drug level monitoring: Secondary | ICD-10-CM | POA: Diagnosis not present

## 2015-05-15 DIAGNOSIS — Z7901 Long term (current) use of anticoagulants: Secondary | ICD-10-CM | POA: Diagnosis not present

## 2015-05-15 DIAGNOSIS — E1122 Type 2 diabetes mellitus with diabetic chronic kidney disease: Secondary | ICD-10-CM | POA: Diagnosis not present

## 2015-05-15 DIAGNOSIS — I6350 Cerebral infarction due to unspecified occlusion or stenosis of unspecified cerebral artery: Secondary | ICD-10-CM

## 2015-05-15 DIAGNOSIS — Z940 Kidney transplant status: Secondary | ICD-10-CM | POA: Diagnosis not present

## 2015-05-15 DIAGNOSIS — F329 Major depressive disorder, single episode, unspecified: Secondary | ICD-10-CM | POA: Diagnosis not present

## 2015-05-15 DIAGNOSIS — Z79891 Long term (current) use of opiate analgesic: Secondary | ICD-10-CM | POA: Diagnosis not present

## 2015-05-15 LAB — POCT INR: INR: 3

## 2015-05-22 ENCOUNTER — Ambulatory Visit (INDEPENDENT_AMBULATORY_CARE_PROVIDER_SITE_OTHER): Payer: Medicare Other | Admitting: Cardiovascular Disease

## 2015-05-22 DIAGNOSIS — R200 Anesthesia of skin: Secondary | ICD-10-CM | POA: Diagnosis not present

## 2015-05-22 DIAGNOSIS — R112 Nausea with vomiting, unspecified: Secondary | ICD-10-CM | POA: Diagnosis not present

## 2015-05-22 DIAGNOSIS — M109 Gout, unspecified: Secondary | ICD-10-CM | POA: Diagnosis not present

## 2015-05-22 DIAGNOSIS — Z7901 Long term (current) use of anticoagulants: Secondary | ICD-10-CM | POA: Diagnosis not present

## 2015-05-22 DIAGNOSIS — L932 Other local lupus erythematosus: Secondary | ICD-10-CM | POA: Diagnosis not present

## 2015-05-22 DIAGNOSIS — Z86718 Personal history of other venous thrombosis and embolism: Secondary | ICD-10-CM | POA: Diagnosis not present

## 2015-05-22 DIAGNOSIS — I421 Obstructive hypertrophic cardiomyopathy: Secondary | ICD-10-CM | POA: Diagnosis not present

## 2015-05-22 DIAGNOSIS — I4581 Long QT syndrome: Secondary | ICD-10-CM | POA: Diagnosis not present

## 2015-05-22 DIAGNOSIS — E119 Type 2 diabetes mellitus without complications: Secondary | ICD-10-CM | POA: Diagnosis not present

## 2015-05-22 DIAGNOSIS — I6350 Cerebral infarction due to unspecified occlusion or stenosis of unspecified cerebral artery: Secondary | ICD-10-CM

## 2015-05-22 DIAGNOSIS — R42 Dizziness and giddiness: Secondary | ICD-10-CM | POA: Diagnosis not present

## 2015-05-22 DIAGNOSIS — Z9049 Acquired absence of other specified parts of digestive tract: Secondary | ICD-10-CM | POA: Diagnosis not present

## 2015-05-22 DIAGNOSIS — I5032 Chronic diastolic (congestive) heart failure: Secondary | ICD-10-CM | POA: Diagnosis not present

## 2015-05-22 DIAGNOSIS — N186 End stage renal disease: Secondary | ICD-10-CM | POA: Diagnosis not present

## 2015-05-22 DIAGNOSIS — R000 Tachycardia, unspecified: Secondary | ICD-10-CM | POA: Diagnosis not present

## 2015-05-22 DIAGNOSIS — I129 Hypertensive chronic kidney disease with stage 1 through stage 4 chronic kidney disease, or unspecified chronic kidney disease: Secondary | ICD-10-CM | POA: Diagnosis not present

## 2015-05-22 DIAGNOSIS — R1084 Generalized abdominal pain: Secondary | ICD-10-CM | POA: Diagnosis not present

## 2015-05-22 DIAGNOSIS — Z79891 Long term (current) use of opiate analgesic: Secondary | ICD-10-CM | POA: Diagnosis not present

## 2015-05-22 DIAGNOSIS — R197 Diarrhea, unspecified: Secondary | ICD-10-CM | POA: Diagnosis not present

## 2015-05-22 DIAGNOSIS — B029 Zoster without complications: Secondary | ICD-10-CM | POA: Diagnosis not present

## 2015-05-22 DIAGNOSIS — E041 Nontoxic single thyroid nodule: Secondary | ICD-10-CM | POA: Diagnosis not present

## 2015-05-22 DIAGNOSIS — N189 Chronic kidney disease, unspecified: Secondary | ICD-10-CM | POA: Diagnosis not present

## 2015-05-22 DIAGNOSIS — R9431 Abnormal electrocardiogram [ECG] [EKG]: Secondary | ICD-10-CM | POA: Diagnosis not present

## 2015-05-22 DIAGNOSIS — R10811 Right upper quadrant abdominal tenderness: Secondary | ICD-10-CM | POA: Diagnosis not present

## 2015-05-22 DIAGNOSIS — R531 Weakness: Secondary | ICD-10-CM | POA: Diagnosis not present

## 2015-05-22 DIAGNOSIS — R911 Solitary pulmonary nodule: Secondary | ICD-10-CM | POA: Diagnosis not present

## 2015-05-22 DIAGNOSIS — I130 Hypertensive heart and chronic kidney disease with heart failure and stage 1 through stage 4 chronic kidney disease, or unspecified chronic kidney disease: Secondary | ICD-10-CM | POA: Diagnosis not present

## 2015-05-22 DIAGNOSIS — Z5181 Encounter for therapeutic drug level monitoring: Secondary | ICD-10-CM | POA: Diagnosis not present

## 2015-05-22 DIAGNOSIS — F329 Major depressive disorder, single episode, unspecified: Secondary | ICD-10-CM | POA: Diagnosis not present

## 2015-05-22 DIAGNOSIS — E1122 Type 2 diabetes mellitus with diabetic chronic kidney disease: Secondary | ICD-10-CM | POA: Diagnosis not present

## 2015-05-22 DIAGNOSIS — Z8673 Personal history of transient ischemic attack (TIA), and cerebral infarction without residual deficits: Secondary | ICD-10-CM | POA: Diagnosis not present

## 2015-05-22 DIAGNOSIS — Z7952 Long term (current) use of systemic steroids: Secondary | ICD-10-CM | POA: Diagnosis not present

## 2015-05-22 DIAGNOSIS — Z940 Kidney transplant status: Secondary | ICD-10-CM | POA: Diagnosis not present

## 2015-05-22 DIAGNOSIS — I120 Hypertensive chronic kidney disease with stage 5 chronic kidney disease or end stage renal disease: Secondary | ICD-10-CM | POA: Diagnosis not present

## 2015-05-22 DIAGNOSIS — R011 Cardiac murmur, unspecified: Secondary | ICD-10-CM | POA: Diagnosis not present

## 2015-05-22 DIAGNOSIS — I69351 Hemiplegia and hemiparesis following cerebral infarction affecting right dominant side: Secondary | ICD-10-CM | POA: Diagnosis not present

## 2015-05-22 DIAGNOSIS — R5383 Other fatigue: Secondary | ICD-10-CM | POA: Diagnosis not present

## 2015-05-22 DIAGNOSIS — E785 Hyperlipidemia, unspecified: Secondary | ICD-10-CM | POA: Diagnosis not present

## 2015-05-22 DIAGNOSIS — N289 Disorder of kidney and ureter, unspecified: Secondary | ICD-10-CM | POA: Diagnosis not present

## 2015-05-22 DIAGNOSIS — M329 Systemic lupus erythematosus, unspecified: Secondary | ICD-10-CM | POA: Diagnosis not present

## 2015-05-22 DIAGNOSIS — R109 Unspecified abdominal pain: Secondary | ICD-10-CM | POA: Diagnosis not present

## 2015-05-22 DIAGNOSIS — Z79899 Other long term (current) drug therapy: Secondary | ICD-10-CM | POA: Diagnosis not present

## 2015-05-22 LAB — POCT INR: INR: 1.7

## 2015-05-23 DIAGNOSIS — I4581 Long QT syndrome: Secondary | ICD-10-CM | POA: Diagnosis not present

## 2015-05-23 DIAGNOSIS — R9431 Abnormal electrocardiogram [ECG] [EKG]: Secondary | ICD-10-CM | POA: Diagnosis not present

## 2015-05-23 DIAGNOSIS — R000 Tachycardia, unspecified: Secondary | ICD-10-CM | POA: Diagnosis not present

## 2015-05-27 DIAGNOSIS — I69351 Hemiplegia and hemiparesis following cerebral infarction affecting right dominant side: Secondary | ICD-10-CM

## 2015-05-27 DIAGNOSIS — N189 Chronic kidney disease, unspecified: Secondary | ICD-10-CM | POA: Diagnosis not present

## 2015-05-27 DIAGNOSIS — I130 Hypertensive heart and chronic kidney disease with heart failure and stage 1 through stage 4 chronic kidney disease, or unspecified chronic kidney disease: Secondary | ICD-10-CM | POA: Diagnosis not present

## 2015-05-27 DIAGNOSIS — I5032 Chronic diastolic (congestive) heart failure: Secondary | ICD-10-CM | POA: Diagnosis not present

## 2015-05-27 DIAGNOSIS — Z5181 Encounter for therapeutic drug level monitoring: Secondary | ICD-10-CM | POA: Diagnosis not present

## 2015-05-29 ENCOUNTER — Ambulatory Visit (INDEPENDENT_AMBULATORY_CARE_PROVIDER_SITE_OTHER): Payer: Medicare Other | Admitting: Cardiology

## 2015-05-29 DIAGNOSIS — Z940 Kidney transplant status: Secondary | ICD-10-CM | POA: Diagnosis not present

## 2015-05-29 DIAGNOSIS — I69351 Hemiplegia and hemiparesis following cerebral infarction affecting right dominant side: Secondary | ICD-10-CM | POA: Diagnosis not present

## 2015-05-29 DIAGNOSIS — F329 Major depressive disorder, single episode, unspecified: Secondary | ICD-10-CM | POA: Diagnosis not present

## 2015-05-29 DIAGNOSIS — Z7952 Long term (current) use of systemic steroids: Secondary | ICD-10-CM | POA: Diagnosis not present

## 2015-05-29 DIAGNOSIS — I5032 Chronic diastolic (congestive) heart failure: Secondary | ICD-10-CM | POA: Diagnosis not present

## 2015-05-29 DIAGNOSIS — Z79891 Long term (current) use of opiate analgesic: Secondary | ICD-10-CM | POA: Diagnosis not present

## 2015-05-29 DIAGNOSIS — Z5181 Encounter for therapeutic drug level monitoring: Secondary | ICD-10-CM | POA: Diagnosis not present

## 2015-05-29 DIAGNOSIS — E1122 Type 2 diabetes mellitus with diabetic chronic kidney disease: Secondary | ICD-10-CM | POA: Diagnosis not present

## 2015-05-29 DIAGNOSIS — Z86718 Personal history of other venous thrombosis and embolism: Secondary | ICD-10-CM | POA: Diagnosis not present

## 2015-05-29 DIAGNOSIS — N189 Chronic kidney disease, unspecified: Secondary | ICD-10-CM | POA: Diagnosis not present

## 2015-05-29 DIAGNOSIS — Z7901 Long term (current) use of anticoagulants: Secondary | ICD-10-CM | POA: Diagnosis not present

## 2015-05-29 DIAGNOSIS — I6350 Cerebral infarction due to unspecified occlusion or stenosis of unspecified cerebral artery: Secondary | ICD-10-CM

## 2015-05-29 DIAGNOSIS — I130 Hypertensive heart and chronic kidney disease with heart failure and stage 1 through stage 4 chronic kidney disease, or unspecified chronic kidney disease: Secondary | ICD-10-CM | POA: Diagnosis not present

## 2015-05-29 DIAGNOSIS — M329 Systemic lupus erythematosus, unspecified: Secondary | ICD-10-CM | POA: Diagnosis not present

## 2015-05-29 LAB — POCT INR: INR: 2.8

## 2015-06-05 ENCOUNTER — Ambulatory Visit (INDEPENDENT_AMBULATORY_CARE_PROVIDER_SITE_OTHER): Payer: Medicare Other | Admitting: Cardiovascular Disease

## 2015-06-05 DIAGNOSIS — I130 Hypertensive heart and chronic kidney disease with heart failure and stage 1 through stage 4 chronic kidney disease, or unspecified chronic kidney disease: Secondary | ICD-10-CM | POA: Diagnosis not present

## 2015-06-05 DIAGNOSIS — Z86718 Personal history of other venous thrombosis and embolism: Secondary | ICD-10-CM | POA: Diagnosis not present

## 2015-06-05 DIAGNOSIS — I5032 Chronic diastolic (congestive) heart failure: Secondary | ICD-10-CM | POA: Diagnosis not present

## 2015-06-05 DIAGNOSIS — Z940 Kidney transplant status: Secondary | ICD-10-CM | POA: Diagnosis not present

## 2015-06-05 DIAGNOSIS — Z5181 Encounter for therapeutic drug level monitoring: Secondary | ICD-10-CM | POA: Diagnosis not present

## 2015-06-05 DIAGNOSIS — I6350 Cerebral infarction due to unspecified occlusion or stenosis of unspecified cerebral artery: Secondary | ICD-10-CM

## 2015-06-05 DIAGNOSIS — E1122 Type 2 diabetes mellitus with diabetic chronic kidney disease: Secondary | ICD-10-CM | POA: Diagnosis not present

## 2015-06-05 DIAGNOSIS — Z79891 Long term (current) use of opiate analgesic: Secondary | ICD-10-CM | POA: Diagnosis not present

## 2015-06-05 DIAGNOSIS — M329 Systemic lupus erythematosus, unspecified: Secondary | ICD-10-CM | POA: Diagnosis not present

## 2015-06-05 DIAGNOSIS — I69351 Hemiplegia and hemiparesis following cerebral infarction affecting right dominant side: Secondary | ICD-10-CM | POA: Diagnosis not present

## 2015-06-05 DIAGNOSIS — Z7952 Long term (current) use of systemic steroids: Secondary | ICD-10-CM | POA: Diagnosis not present

## 2015-06-05 DIAGNOSIS — Z7901 Long term (current) use of anticoagulants: Secondary | ICD-10-CM | POA: Diagnosis not present

## 2015-06-05 DIAGNOSIS — N189 Chronic kidney disease, unspecified: Secondary | ICD-10-CM | POA: Diagnosis not present

## 2015-06-05 DIAGNOSIS — F329 Major depressive disorder, single episode, unspecified: Secondary | ICD-10-CM | POA: Diagnosis not present

## 2015-06-05 LAB — POCT INR: INR: 2.6

## 2015-06-12 DIAGNOSIS — Z5181 Encounter for therapeutic drug level monitoring: Secondary | ICD-10-CM | POA: Diagnosis not present

## 2015-06-12 DIAGNOSIS — M329 Systemic lupus erythematosus, unspecified: Secondary | ICD-10-CM | POA: Diagnosis not present

## 2015-06-12 DIAGNOSIS — I130 Hypertensive heart and chronic kidney disease with heart failure and stage 1 through stage 4 chronic kidney disease, or unspecified chronic kidney disease: Secondary | ICD-10-CM | POA: Diagnosis not present

## 2015-06-12 DIAGNOSIS — Z7952 Long term (current) use of systemic steroids: Secondary | ICD-10-CM | POA: Diagnosis not present

## 2015-06-12 DIAGNOSIS — E1122 Type 2 diabetes mellitus with diabetic chronic kidney disease: Secondary | ICD-10-CM | POA: Diagnosis not present

## 2015-06-12 DIAGNOSIS — I5032 Chronic diastolic (congestive) heart failure: Secondary | ICD-10-CM | POA: Diagnosis not present

## 2015-06-12 DIAGNOSIS — Z7901 Long term (current) use of anticoagulants: Secondary | ICD-10-CM | POA: Diagnosis not present

## 2015-06-12 DIAGNOSIS — Z940 Kidney transplant status: Secondary | ICD-10-CM | POA: Diagnosis not present

## 2015-06-12 DIAGNOSIS — N189 Chronic kidney disease, unspecified: Secondary | ICD-10-CM | POA: Diagnosis not present

## 2015-06-12 DIAGNOSIS — I69351 Hemiplegia and hemiparesis following cerebral infarction affecting right dominant side: Secondary | ICD-10-CM | POA: Diagnosis not present

## 2015-06-12 DIAGNOSIS — Z86718 Personal history of other venous thrombosis and embolism: Secondary | ICD-10-CM | POA: Diagnosis not present

## 2015-06-12 DIAGNOSIS — Z79891 Long term (current) use of opiate analgesic: Secondary | ICD-10-CM | POA: Diagnosis not present

## 2015-06-15 ENCOUNTER — Emergency Department (HOSPITAL_COMMUNITY): Payer: Medicare Other

## 2015-06-15 ENCOUNTER — Emergency Department (HOSPITAL_COMMUNITY)
Admission: EM | Admit: 2015-06-15 | Discharge: 2015-06-15 | Disposition: A | Discharge status: Home / Self Care | Payer: Medicare Other | Attending: Emergency Medicine | Admitting: Emergency Medicine

## 2015-06-15 ENCOUNTER — Encounter (HOSPITAL_COMMUNITY): Payer: Self-pay | Admitting: *Deleted

## 2015-06-15 DIAGNOSIS — E119 Type 2 diabetes mellitus without complications: Secondary | ICD-10-CM | POA: Insufficient documentation

## 2015-06-15 DIAGNOSIS — R000 Tachycardia, unspecified: Secondary | ICD-10-CM | POA: Insufficient documentation

## 2015-06-15 DIAGNOSIS — F329 Major depressive disorder, single episode, unspecified: Secondary | ICD-10-CM | POA: Diagnosis not present

## 2015-06-15 DIAGNOSIS — M810 Age-related osteoporosis without current pathological fracture: Secondary | ICD-10-CM | POA: Insufficient documentation

## 2015-06-15 DIAGNOSIS — Z8619 Personal history of other infectious and parasitic diseases: Secondary | ICD-10-CM | POA: Diagnosis not present

## 2015-06-15 DIAGNOSIS — Z79899 Other long term (current) drug therapy: Secondary | ICD-10-CM | POA: Diagnosis not present

## 2015-06-15 DIAGNOSIS — Z9049 Acquired absence of other specified parts of digestive tract: Secondary | ICD-10-CM | POA: Insufficient documentation

## 2015-06-15 DIAGNOSIS — R6883 Chills (without fever): Secondary | ICD-10-CM | POA: Insufficient documentation

## 2015-06-15 DIAGNOSIS — Z7901 Long term (current) use of anticoagulants: Secondary | ICD-10-CM | POA: Diagnosis not present

## 2015-06-15 DIAGNOSIS — K219 Gastro-esophageal reflux disease without esophagitis: Secondary | ICD-10-CM | POA: Insufficient documentation

## 2015-06-15 DIAGNOSIS — R1084 Generalized abdominal pain: Secondary | ICD-10-CM | POA: Insufficient documentation

## 2015-06-15 DIAGNOSIS — R109 Unspecified abdominal pain: Secondary | ICD-10-CM | POA: Diagnosis not present

## 2015-06-15 DIAGNOSIS — R630 Anorexia: Secondary | ICD-10-CM | POA: Insufficient documentation

## 2015-06-15 DIAGNOSIS — Z940 Kidney transplant status: Secondary | ICD-10-CM | POA: Insufficient documentation

## 2015-06-15 DIAGNOSIS — I10 Essential (primary) hypertension: Secondary | ICD-10-CM | POA: Diagnosis not present

## 2015-06-15 DIAGNOSIS — R197 Diarrhea, unspecified: Secondary | ICD-10-CM | POA: Diagnosis not present

## 2015-06-15 DIAGNOSIS — Z91040 Latex allergy status: Secondary | ICD-10-CM | POA: Diagnosis not present

## 2015-06-15 DIAGNOSIS — E785 Hyperlipidemia, unspecified: Secondary | ICD-10-CM | POA: Insufficient documentation

## 2015-06-15 DIAGNOSIS — R112 Nausea with vomiting, unspecified: Secondary | ICD-10-CM | POA: Diagnosis not present

## 2015-06-15 DIAGNOSIS — I509 Heart failure, unspecified: Secondary | ICD-10-CM | POA: Insufficient documentation

## 2015-06-15 DIAGNOSIS — Z8673 Personal history of transient ischemic attack (TIA), and cerebral infarction without residual deficits: Secondary | ICD-10-CM | POA: Diagnosis not present

## 2015-06-15 DIAGNOSIS — Z86718 Personal history of other venous thrombosis and embolism: Secondary | ICD-10-CM | POA: Diagnosis not present

## 2015-06-15 DIAGNOSIS — Z87448 Personal history of other diseases of urinary system: Secondary | ICD-10-CM | POA: Insufficient documentation

## 2015-06-15 LAB — CBC WITH DIFFERENTIAL/PLATELET
Basophils Absolute: 0 10*3/uL (ref 0.0–0.1)
Basophils Relative: 1 %
Eosinophils Absolute: 0.1 10*3/uL (ref 0.0–0.7)
Eosinophils Relative: 1 %
HCT: 42 % (ref 36.0–46.0)
Hemoglobin: 13.5 g/dL (ref 12.0–15.0)
Lymphocytes Relative: 56 %
Lymphs Abs: 4.3 10*3/uL — ABNORMAL HIGH (ref 0.7–4.0)
MCH: 28.6 pg (ref 26.0–34.0)
MCHC: 32.1 g/dL (ref 30.0–36.0)
MCV: 89 fL (ref 78.0–100.0)
Monocytes Absolute: 0.6 10*3/uL (ref 0.1–1.0)
Monocytes Relative: 8 %
Neutro Abs: 2.5 10*3/uL (ref 1.7–7.7)
Neutrophils Relative %: 34 %
Platelets: 206 10*3/uL (ref 150–400)
RBC: 4.72 MIL/uL (ref 3.87–5.11)
RDW: 14.3 % (ref 11.5–15.5)
WBC: 7.6 10*3/uL (ref 4.0–10.5)

## 2015-06-15 LAB — COMPREHENSIVE METABOLIC PANEL
ALT: 13 U/L — ABNORMAL LOW (ref 14–54)
AST: 21 U/L (ref 15–41)
Albumin: 3.1 g/dL — ABNORMAL LOW (ref 3.5–5.0)
Alkaline Phosphatase: 59 U/L (ref 38–126)
Anion gap: 9 (ref 5–15)
BUN: 14 mg/dL (ref 6–20)
CO2: 24 mmol/L (ref 22–32)
Calcium: 9.3 mg/dL (ref 8.9–10.3)
Chloride: 110 mmol/L (ref 101–111)
Creatinine, Ser: 0.69 mg/dL (ref 0.44–1.00)
GFR calc Af Amer: >60 mL/min (ref >60)
GFR calc non Af Amer: >60 mL/min (ref >60)
Glucose, Bld: 113 mg/dL — ABNORMAL HIGH (ref 65–99)
Potassium: 3.6 mmol/L (ref 3.5–5.1)
Sodium: 143 mmol/L (ref 135–145)
Total Bilirubin: 0.9 mg/dL (ref 0.3–1.2)
Total Protein: 6.1 g/dL — ABNORMAL LOW (ref 6.5–8.1)

## 2015-06-15 LAB — URINE MICROSCOPIC-ADD ON
Bacteria, UA: NONE SEEN
Squamous Epithelial / LPF: NONE SEEN
WBC, UA: NONE SEEN WBC/hpf (ref 0–5)

## 2015-06-15 LAB — URINALYSIS, ROUTINE W REFLEX MICROSCOPIC
Bilirubin Urine: NEGATIVE
Glucose, UA: NEGATIVE mg/dL
Ketones, ur: NEGATIVE mg/dL
Leukocytes, UA: NEGATIVE
Nitrite: NEGATIVE
Protein, ur: NEGATIVE mg/dL
Specific Gravity, Urine: 1.003 — ABNORMAL LOW (ref 1.005–1.030)
pH: 5.5 (ref 5.0–8.0)

## 2015-06-15 LAB — LIPASE, BLOOD: Lipase: 19 U/L (ref 11–51)

## 2015-06-15 MED ORDER — ONDANSETRON HCL 4 MG/2ML IJ SOLN
4.0000 mg | Freq: Once | INTRAMUSCULAR | Status: AC
  Administered 2015-06-15: 4 mg via INTRAVENOUS
  Filled 2015-06-15: qty 2

## 2015-06-15 MED ORDER — FENTANYL CITRATE (PF) 100 MCG/2ML IJ SOLN
100.0000 ug | Freq: Once | INTRAMUSCULAR | Status: AC
Start: 2015-06-15 — End: 2015-06-15
  Administered 2015-06-15: 100 ug via INTRAVENOUS
  Filled 2015-06-15: qty 2

## 2015-06-15 MED ORDER — HYDROMORPHONE HCL 1 MG/ML IJ SOLN: 1.0000 mg | Freq: Once | INTRAMUSCULAR | Status: DC

## 2015-06-15 MED ORDER — SODIUM CHLORIDE 0.9 % IV BOLUS (SEPSIS)
1000.0000 mL | Freq: Once | INTRAVENOUS | Status: AC
  Administered 2015-06-15: 1000 mL via INTRAVENOUS

## 2015-06-15 MED ORDER — ONDANSETRON 4 MG PO TBDP: ORAL_TABLET | ORAL | Status: DC

## 2015-06-15 MED ORDER — IOHEXOL 350 MG/ML SOLN
100.0000 mL | Freq: Once | INTRAVENOUS | Status: AC | PRN
  Administered 2015-06-15: 100 mL via INTRAVENOUS

## 2015-06-15 MED ORDER — HYDROCODONE-ACETAMINOPHEN 5-325 MG PO TABS: 1.0000 | ORAL_TABLET | ORAL | Status: DC | PRN

## 2015-06-15 MED ORDER — FENTANYL CITRATE (PF) 100 MCG/2ML IJ SOLN
100.0000 ug | Freq: Once | INTRAMUSCULAR | Status: AC
  Administered 2015-06-15: 100 ug via INTRAVENOUS
  Filled 2015-06-15: qty 2

## 2015-06-15 NOTE — ED Notes (Signed)
Patient states she is still not able to provide a urine sample

## 2015-06-15 NOTE — ED Notes (Signed)
Per [facility], pt has c/o abd pain x 1 week, has been seen by her doctor but he has been unable to dx.  Daughter informed EMS "she won't talk but will argue with you."

## 2015-06-15 NOTE — ED Notes (Signed)
PTAR at bedside 

## 2015-06-15 NOTE — ED Notes (Signed)
Bed: [facility] Expected date:  Expected time:  Means of arrival:  Comments: abd pain

## 2015-06-15 NOTE — ED Notes (Signed)
Pt has been told a urine sample is needed from her, says that she doesn't have to go right now

## 2015-06-15 NOTE — ED Provider Notes (Signed)
CSN: [id number]     Arrival date & time 06/15/15  1732 History   First MD Initiated Contact with Patient 06/15/15 1813     Chief Complaint  Patient presents with  . Abdominal Pain    x 1 week     (Consider location/radiation/quality/duration/timing/severity/associated sxs/prior Treatment) Patient is a 55 y.o. female presenting with abdominal pain.  Abdominal Pain Pain location:  Generalized Pain quality: aching and sharp   Pain radiates to:  Does not radiate Pain severity:  Mild Ineffective treatments:  None tried Associated symptoms: chills, diarrhea, nausea and vomiting   Associated symptoms: no fever     Past Medical History  Diagnosis Date  . THYROID NODULE, LEFT 04/10/2009  . HYPERLIPIDEMIA 08/21/2007  . GOUT 08/21/2007  . HYPERTENSION 08/21/2007    Dr. Andree Elk, [place] 08/21/2007  . CEREBROVASCULAR ACCIDENT, ACUTE 04/15/2010  . GERD 08/21/2007  . RENAL INSUFFICIENCY 08/21/2007  . LUPUS 08/21/2007  . OSTEOPOROSIS 08/21/2007    [facility] at [facility]  . DVT, HX OF 08/21/2007  . CLOSTRIDIUM DIFFICILE COLITIS, HX OF 08/21/2007  . KIDNEY TRANSPLANTATION, HX OF 08/22/2007    s[facility] renal [facility]ree [facility]k, [facility]  . Pulmonary embolism ([place]) 07/16/2010  . Renal failure   . Current use of long term anticoagulation     Dr. Andree Elk, [facility]  . Depression     Dr. Andree Elk, [facility]  . History of stroke with residual effects   . Right sided weakness   . CVA 04/17/2010  . Tachycardia   . DIABETES MELLITUS, TYPE II 08/21/2007  . Candida esophagitis ([place]) 11/12/2014  . Steroid-induced hyperglycemia 11/09/2014   Past Surgical History  Procedure Laterality Date  . Cholecystectomy    . Tubal ligation    . Kidney transplant Right 2009  . Renal biopsy, open  1981  . Enteroscopy N/A 11/11/2014    Procedure: ENTEROSCOPY;  Surgeon: Ladene Artist, MD;  Location: [facility] ENDOSCOPY;  Service: Endoscopy;  Laterality: N/A;   Family History  Problem Relation Age of Onset  . Heart  attack Mother   . Heart disease Father   . Asthma Sister   . Asthma Sister   . Asthma Daughter   . Cancer Maternal Grandfather     prostate  . Cancer Paternal Grandfather     colon   Social History  Substance Use Topics  . Smoking status: Never Smoker   . Smokeless tobacco: Never Used  . Alcohol Use: No   OB History    No data available     Review of Systems  Constitutional: Positive for chills and appetite change. Negative for fever.  Gastrointestinal: Positive for nausea, vomiting, abdominal pain and diarrhea.  Musculoskeletal: Negative for joint swelling and gait problem.  All other systems reviewed and are negative.     Allergies  Oxycodone-acetaminophen; Propoxyphene n-acetaminophen; Sulfonamide derivatives; Codeine; Other; Gabapentin; Latex; Metoprolol; and Morphine and related  Home Medications   Prior to Admission medications   Medication Sig Start Date End Date Taking? Authorizing Provider  alendronate (FOSAMAX) 70 MG tablet Take 70 mg by mouth once a week. Take on Saturdays with a full glass of water on an empty stomach.    Historical Provider, MD  ammonium lactate (AMLACTIN) 12 % cream Apply topically as needed for dry skin.    Historical Provider, MD  atorvastatin (LIPITOR) 40 MG tablet Take 40 mg by mouth daily. 03/21/15   Historical Provider, MD  Blood Glucose Monitoring Suppl (ONETOUCH VERIO IQ SYSTEM) W/DEVICE KIT  Use to check blood sugar 1 time per day 02/12/15   Renato Shin, MD  calcitRIOL (ROCALTROL) 0.25 MCG capsule TAKE ONE CAPSULE BY MOUTH DAILY 02/11/15   Renato Shin, MD  diltiazem [facility]) 240 MG 24 hr capsule Take 1 capsule (240 mg total) by mouth daily. 10/04/14   Renato Shin, MD  donepezil (ARICEPT) 10 MG tablet Take 1 tablet (10 mg total) by mouth at bedtime. 05/08/15   Pieter Partridge, DO  esomeprazole (NEXIUM) 20 MG capsule Take 20 mg by mouth daily. 03/21/15   Historical Provider, MD  glucose blood (ONE TOUCH ULTRA TEST) test strip Use to check  blood sugar 1 time per day 02/12/15   Renato Shin, MD  HYDROcodone-acetaminophen (NORCO/VICODIN) 5-325 MG tablet Take 1-2 tablets by mouth every 4 (four) hours as needed for moderate pain. 03/27/15   Orpah Greek, MD  losartan (COZAAR) 100 MG tablet Take 1 tablet (100 mg total) by mouth daily. 04/29/15   Renato Shin, MD  mirtazapine (REMERON) 15 MG tablet Take 1 tablet (15 mg total) by mouth at bedtime. 05/08/15   Pieter Partridge, DO  Multiple Vitamin (MULTIVITAMIN WITH MINERALS) TABS tablet Take 1 tablet by mouth daily.    Historical Provider, MD  mycophenolate (CELLCEPT) 250 MG capsule Take 250 mg by mouth 2 (two) times daily.    Historical Provider, MD  nystatin (MYCOSTATIN) powder Apply 1 g topically 4 (four) times daily as needed. For yeast under breast    Historical Provider, MD  ondansetron (ZOFRAN ODT) 4 MG disintegrating tablet 38m ODT q4 hours prn nausea/vomiting Patient taking differently: Take 4 mg by mouth every 8 (eight) hours as needed for nausea.  06/28/14   SSherwood Gambler MD  OThe [facility] LLCDELICA LANCETS [id number] Use to check blood sugar 1 time per day 02/12/15   SRenato Shin MD  oxybutynin (DITROPAN) 5 MG tablet Take 5 mg by mouth daily as needed for bladder spasms.     Historical Provider, MD  predniSONE (DELTASONE) 5 MG tablet Take 5 mg by mouth daily with breakfast.    Historical Provider, MD  tacrolimus (PROGRAF) 1 MG capsule Take 1 mg by mouth 2 (two) times daily.  04/16/14   Historical Provider, MD  traMADol (ULTRAM) 50 MG tablet Take 1 tablet (50 mg total) by mouth every 6 (six) hours as needed for moderate pain. 04/09/15   JOlam Idler MD  triamcinolone (KENALOG) 0.025 % cream Apply 1 application topically daily as needed (dry skin).     Historical Provider, MD  warfarin (COUMADIN) 2.5 MG tablet Take 2.5 mg by mouth as directed.     Historical Provider, MD  warfarin (COUMADIN) 5 MG tablet Take 1 tablet (5 mg total) by mouth daily. 02/22/15   Belkys A Regalado, MD   BP  143/99 mmHg  Pulse 114  Temp(Src) 98.2 F (36.8 C) (Oral)  Resp 18  SpO2 100% Physical Exam  Constitutional: She is oriented to person, place, and time. She appears well-developed and well-nourished.  HENT:  Head: Normocephalic and atraumatic.  Neck: Normal range of motion.  Cardiovascular: Regular rhythm.  Tachycardia present.   Pulmonary/Chest: No stridor. No respiratory distress.  Abdominal: She exhibits no distension. There is tenderness (mild diffuse).  Musculoskeletal: Normal range of motion. She exhibits no edema or tenderness.  Neurological: She is alert and oriented to person, place, and time.  Skin: Skin is warm and dry.  Nursing note and vitals reviewed.   ED Course  Procedures (including critical care  time) Labs Review Labs Reviewed  CBC WITH DIFFERENTIAL/PLATELET - Abnormal; Notable for the following:    Lymphs Abs 4.3 (*)    All other components within normal limits  COMPREHENSIVE METABOLIC PANEL - Abnormal; Notable for the following:    Glucose, Bld 113 (*)    Total Protein 6.1 (*)    Albumin 3.1 (*)    ALT 13 (*)    All other components within normal limits  URINALYSIS, ROUTINE W REFLEX MICROSCOPIC (NOT AT [facility]) - Abnormal; Notable for the following:    APPearance CLOUDY (*)    Specific Gravity, Urine 1.003 (*)    Hgb urine dipstick MODERATE (*)    All other components within normal limits  LIPASE, BLOOD  URINE MICROSCOPIC-ADD ON    Imaging Review Ct Angio Pelvis W/cm &/or Wo/cm  06/15/2015  CLINICAL DATA:  Sharp mid abdominal pain for 1 week EXAM: CT ANGIOGRAPHY ABDOMEN; CT ANGIOGRAPHY OF PELVIS TECHNIQUE: Multidetector CT imaging of the abdomen was performed using the standard protocol during bolus administration of intravenous contrast. Multiplanar reconstructed images including MIPs were obtained and reviewed to evaluate the vascular anatomy.; Multidetector CT imaging of the pelvis was performed before and during bolus injection of intravenous contrast.  Multiplanar CT angiographic image reconstructions including MIPs were generated to evaluate the vascular anatomy. CONTRAST:  169m OMNIPAQUE IOHEXOL 350 MG/ML SOLN COMPARISON:  02/14/2015 FINDINGS: Lower chest:  No acute findings Hepatobiliary: Status post cholecystectomy. Mild intrahepatic and extrahepatic biliary dilatation similar to prior study likely related to prior cholecystectomy. Pancreas: Negative Spleen: No significant abnormalities. Adrenals/Urinary Tract: Adrenal glands are negative. Severe bilateral renal atrophy with a few stable cysts. Transplanted kidney right pelvis. 2 mm nonobstructing stone within the upper pole region of the transplanted kidney. No evidence of hydronephrosis involving the transplanted kidney. Bladder shows no significant abnormalities. Stomach/Bowel: There is a nonobstructive bowel gas pattern. Moderate diverticulosis throughout the colon without evidence of diverticulitis. Appendix is normal. Vascular/Lymphatic: Mild atherosclerotic calcification of the aorta and iliac arteries. No evidence of dissection or dilatation. Calcification at the origin of the celiac artery and superior mesenteric artery as well as the bilateral renal arteries and inferior mesenteric artery. All of these vessels do opacified with contrast. Reproductive: No significant abnormalities Other: No free fluid or air in the abdomen or pelvis. Musculoskeletal: No acute abnormalities Review of the MIP images confirms the above findings. IMPRESSION: No acute findings, vascular or otherwise. Electronically Signed   By: RSkipper ClicheM.D.   On: 06/15/2015 20:39   Ct Angio Abdomen W/cm &/or Wo Contrast  06/15/2015  CLINICAL DATA:  Sharp mid abdominal pain for 1 week EXAM: CT ANGIOGRAPHY ABDOMEN; CT ANGIOGRAPHY OF PELVIS TECHNIQUE: Multidetector CT imaging of the abdomen was performed using the standard protocol during bolus administration of intravenous contrast. Multiplanar reconstructed images including MIPs  were obtained and reviewed to evaluate the vascular anatomy.; Multidetector CT imaging of the pelvis was performed before and during bolus injection of intravenous contrast. Multiplanar CT angiographic image reconstructions including MIPs were generated to evaluate the vascular anatomy. CONTRAST:  1044mOMNIPAQUE IOHEXOL 350 MG/ML SOLN COMPARISON:  02/14/2015 FINDINGS: Lower chest:  No acute findings Hepatobiliary: Status post cholecystectomy. Mild intrahepatic and extrahepatic biliary dilatation similar to prior study likely related to prior cholecystectomy. Pancreas: Negative Spleen: No significant abnormalities. Adrenals/Urinary Tract: Adrenal glands are negative. Severe bilateral renal atrophy with a few stable cysts. Transplanted kidney right pelvis. 2 mm nonobstructing stone within the upper pole region of the transplanted kidney. No evidence  of hydronephrosis involving the transplanted kidney. Bladder shows no significant abnormalities. Stomach/Bowel: There is a nonobstructive bowel gas pattern. Moderate diverticulosis throughout the colon without evidence of diverticulitis. Appendix is normal. Vascular/Lymphatic: Mild atherosclerotic calcification of the aorta and iliac arteries. No evidence of dissection or dilatation. Calcification at the origin of the celiac artery and superior mesenteric artery as well as the bilateral renal arteries and inferior mesenteric artery. All of these vessels do opacified with contrast. Reproductive: No significant abnormalities Other: No free fluid or air in the abdomen or pelvis. Musculoskeletal: No acute abnormalities Review of the MIP images confirms the above findings. IMPRESSION: No acute findings, vascular or otherwise. Electronically Signed   By: Skipper Cliche M.D.   On: 06/15/2015 20:39   I have personally reviewed and evaluated these images and lab results as part of my medical decision-making.   EKG Interpretation None      MDM   Final diagnoses:   Abdominal pain    55 yo here with abdominal pain similar to previous episodes but worse in intensity and duration. Also with some vomiting/diarrhea. Exam with some ttp but overall non-surgical, no peritonitis. Due to pain out of proportion to exam, CTA done to eval for mesenteric ischemia nd was unremarkable. Symptoms, HR and pain improved with IV meds. Tolerating PO. Has GI, will follow up with them. can and will return here for new/worsening/not improving symptoms.     Merrily Pew, MD 06/17/15 [phone]

## 2015-06-15 NOTE — ED Notes (Signed)
Pt placed on continuous pulse ox prior to receiving fentanyl.

## 2015-06-15 NOTE — ED Notes (Signed)
Patient transported to CT 

## 2015-06-15 NOTE — ED Notes (Signed)
PTAR notified of transport. 

## 2015-06-15 NOTE — ED Notes (Signed)
Patient's daughter called and wanted an update on the patient. Informed family member that information about patients are not allowed to be given over the phone.

## 2015-06-19 ENCOUNTER — Ambulatory Visit: Payer: Self-pay | Admitting: Podiatry

## 2015-06-19 DIAGNOSIS — I5032 Chronic diastolic (congestive) heart failure: Secondary | ICD-10-CM | POA: Diagnosis not present

## 2015-06-19 DIAGNOSIS — Z940 Kidney transplant status: Secondary | ICD-10-CM | POA: Diagnosis not present

## 2015-06-19 DIAGNOSIS — I69351 Hemiplegia and hemiparesis following cerebral infarction affecting right dominant side: Secondary | ICD-10-CM | POA: Diagnosis not present

## 2015-06-19 DIAGNOSIS — I130 Hypertensive heart and chronic kidney disease with heart failure and stage 1 through stage 4 chronic kidney disease, or unspecified chronic kidney disease: Secondary | ICD-10-CM | POA: Diagnosis not present

## 2015-06-19 DIAGNOSIS — N189 Chronic kidney disease, unspecified: Secondary | ICD-10-CM | POA: Diagnosis not present

## 2015-06-19 DIAGNOSIS — Z7901 Long term (current) use of anticoagulants: Secondary | ICD-10-CM | POA: Diagnosis not present

## 2015-06-19 DIAGNOSIS — E1122 Type 2 diabetes mellitus with diabetic chronic kidney disease: Secondary | ICD-10-CM | POA: Diagnosis not present

## 2015-06-19 DIAGNOSIS — Z79891 Long term (current) use of opiate analgesic: Secondary | ICD-10-CM | POA: Diagnosis not present

## 2015-06-19 DIAGNOSIS — M329 Systemic lupus erythematosus, unspecified: Secondary | ICD-10-CM | POA: Diagnosis not present

## 2015-06-19 DIAGNOSIS — Z5181 Encounter for therapeutic drug level monitoring: Secondary | ICD-10-CM | POA: Diagnosis not present

## 2015-06-19 DIAGNOSIS — Z7952 Long term (current) use of systemic steroids: Secondary | ICD-10-CM | POA: Diagnosis not present

## 2015-06-19 DIAGNOSIS — Z86718 Personal history of other venous thrombosis and embolism: Secondary | ICD-10-CM | POA: Diagnosis not present

## 2015-06-25 DIAGNOSIS — N189 Chronic kidney disease, unspecified: Secondary | ICD-10-CM | POA: Diagnosis not present

## 2015-06-25 DIAGNOSIS — N390 Urinary tract infection, site not specified: Secondary | ICD-10-CM | POA: Diagnosis not present

## 2015-06-25 DIAGNOSIS — Z940 Kidney transplant status: Secondary | ICD-10-CM | POA: Diagnosis not present

## 2015-06-25 DIAGNOSIS — M329 Systemic lupus erythematosus, unspecified: Secondary | ICD-10-CM | POA: Diagnosis not present

## 2015-06-25 DIAGNOSIS — J069 Acute upper respiratory infection, unspecified: Secondary | ICD-10-CM | POA: Diagnosis not present

## 2015-06-25 DIAGNOSIS — I129 Hypertensive chronic kidney disease with stage 1 through stage 4 chronic kidney disease, or unspecified chronic kidney disease: Secondary | ICD-10-CM | POA: Diagnosis not present

## 2015-06-25 DIAGNOSIS — R1030 Lower abdominal pain, unspecified: Secondary | ICD-10-CM | POA: Diagnosis not present

## 2015-06-25 DIAGNOSIS — R109 Unspecified abdominal pain: Secondary | ICD-10-CM | POA: Diagnosis not present

## 2015-06-25 DIAGNOSIS — E1122 Type 2 diabetes mellitus with diabetic chronic kidney disease: Secondary | ICD-10-CM | POA: Diagnosis not present

## 2015-06-25 DIAGNOSIS — R1084 Generalized abdominal pain: Secondary | ICD-10-CM | POA: Diagnosis not present

## 2015-06-26 ENCOUNTER — Telehealth: Payer: Self-pay | Admitting: Cardiovascular Disease

## 2015-06-26 ENCOUNTER — Ambulatory Visit (INDEPENDENT_AMBULATORY_CARE_PROVIDER_SITE_OTHER): Payer: Medicare Other | Admitting: Cardiovascular Disease

## 2015-06-26 DIAGNOSIS — E1122 Type 2 diabetes mellitus with diabetic chronic kidney disease: Secondary | ICD-10-CM | POA: Diagnosis not present

## 2015-06-26 DIAGNOSIS — N189 Chronic kidney disease, unspecified: Secondary | ICD-10-CM | POA: Diagnosis not present

## 2015-06-26 DIAGNOSIS — I5032 Chronic diastolic (congestive) heart failure: Secondary | ICD-10-CM | POA: Diagnosis not present

## 2015-06-26 DIAGNOSIS — I6350 Cerebral infarction due to unspecified occlusion or stenosis of unspecified cerebral artery: Secondary | ICD-10-CM

## 2015-06-26 DIAGNOSIS — I130 Hypertensive heart and chronic kidney disease with heart failure and stage 1 through stage 4 chronic kidney disease, or unspecified chronic kidney disease: Secondary | ICD-10-CM | POA: Diagnosis not present

## 2015-06-26 LAB — POCT INR: INR: 2.2

## 2015-06-26 NOTE — Telephone Encounter (Signed)
INR addressed by [facility]. Office.  See anti-coag note for details.

## 2015-06-26 NOTE — Telephone Encounter (Signed)
INR reported of 2.2  PT 26.8  Advised in therapeutic range, will route to [place] for dose recommendations.

## 2015-06-26 NOTE — Telephone Encounter (Signed)
Looks like you follow her at [facility]

## 2015-06-30 DIAGNOSIS — M150 Primary generalized (osteo)arthritis: Secondary | ICD-10-CM | POA: Diagnosis not present

## 2015-07-01 DIAGNOSIS — M150 Primary generalized (osteo)arthritis: Secondary | ICD-10-CM | POA: Diagnosis not present

## 2015-07-02 DIAGNOSIS — M150 Primary generalized (osteo)arthritis: Secondary | ICD-10-CM | POA: Diagnosis not present

## 2015-07-03 ENCOUNTER — Telehealth: Payer: Self-pay | Admitting: Endocrinology

## 2015-07-03 DIAGNOSIS — M150 Primary generalized (osteo)arthritis: Secondary | ICD-10-CM | POA: Diagnosis not present

## 2015-07-03 NOTE — Telephone Encounter (Signed)
Ov is due Any changes in anticoagulation process need to go through coumadin clinic.

## 2015-07-03 NOTE — Telephone Encounter (Signed)
Levada Dy from [facility] called and would like a verbal order given for:  INR Monitor 1x week for 9 weeks   Please advise    Thank you

## 2015-07-03 NOTE — Telephone Encounter (Signed)
See note below and please advise, Thanks! 

## 2015-07-03 NOTE — Telephone Encounter (Signed)
I contacted Levada Dy with [facility] and advised of note below.

## 2015-07-04 DIAGNOSIS — Z5181 Encounter for therapeutic drug level monitoring: Secondary | ICD-10-CM | POA: Diagnosis not present

## 2015-07-04 DIAGNOSIS — E1122 Type 2 diabetes mellitus with diabetic chronic kidney disease: Secondary | ICD-10-CM | POA: Diagnosis not present

## 2015-07-04 DIAGNOSIS — I130 Hypertensive heart and chronic kidney disease with heart failure and stage 1 through stage 4 chronic kidney disease, or unspecified chronic kidney disease: Secondary | ICD-10-CM | POA: Diagnosis not present

## 2015-07-04 DIAGNOSIS — M150 Primary generalized (osteo)arthritis: Secondary | ICD-10-CM | POA: Diagnosis not present

## 2015-07-04 DIAGNOSIS — I5032 Chronic diastolic (congestive) heart failure: Secondary | ICD-10-CM | POA: Diagnosis not present

## 2015-07-05 ENCOUNTER — Other Ambulatory Visit: Payer: Self-pay | Admitting: Endocrinology

## 2015-07-05 DIAGNOSIS — M150 Primary generalized (osteo)arthritis: Secondary | ICD-10-CM | POA: Diagnosis not present

## 2015-07-06 ENCOUNTER — Encounter (HOSPITAL_COMMUNITY): Payer: Self-pay

## 2015-07-06 ENCOUNTER — Emergency Department (HOSPITAL_COMMUNITY)
Admission: EM | Admit: 2015-07-06 | Discharge: 2015-07-07 | Disposition: A | Discharge status: Home / Self Care | Payer: Medicare Other | Attending: Emergency Medicine | Admitting: Emergency Medicine

## 2015-07-06 DIAGNOSIS — R112 Nausea with vomiting, unspecified: Secondary | ICD-10-CM | POA: Diagnosis not present

## 2015-07-06 DIAGNOSIS — Z940 Kidney transplant status: Secondary | ICD-10-CM | POA: Insufficient documentation

## 2015-07-06 DIAGNOSIS — I10 Essential (primary) hypertension: Secondary | ICD-10-CM | POA: Diagnosis not present

## 2015-07-06 DIAGNOSIS — E119 Type 2 diabetes mellitus without complications: Secondary | ICD-10-CM | POA: Diagnosis not present

## 2015-07-06 DIAGNOSIS — R1084 Generalized abdominal pain: Secondary | ICD-10-CM | POA: Diagnosis not present

## 2015-07-06 DIAGNOSIS — M810 Age-related osteoporosis without current pathological fracture: Secondary | ICD-10-CM | POA: Insufficient documentation

## 2015-07-06 DIAGNOSIS — Z91040 Latex allergy status: Secondary | ICD-10-CM | POA: Diagnosis not present

## 2015-07-06 DIAGNOSIS — Z8619 Personal history of other infectious and parasitic diseases: Secondary | ICD-10-CM | POA: Diagnosis not present

## 2015-07-06 DIAGNOSIS — Z9049 Acquired absence of other specified parts of digestive tract: Secondary | ICD-10-CM | POA: Insufficient documentation

## 2015-07-06 DIAGNOSIS — Z7952 Long term (current) use of systemic steroids: Secondary | ICD-10-CM | POA: Insufficient documentation

## 2015-07-06 DIAGNOSIS — Z86718 Personal history of other venous thrombosis and embolism: Secondary | ICD-10-CM | POA: Insufficient documentation

## 2015-07-06 DIAGNOSIS — I509 Heart failure, unspecified: Secondary | ICD-10-CM | POA: Insufficient documentation

## 2015-07-06 DIAGNOSIS — Z87448 Personal history of other diseases of urinary system: Secondary | ICD-10-CM | POA: Insufficient documentation

## 2015-07-06 DIAGNOSIS — R1032 Left lower quadrant pain: Secondary | ICD-10-CM | POA: Diagnosis present

## 2015-07-06 DIAGNOSIS — M150 Primary generalized (osteo)arthritis: Secondary | ICD-10-CM | POA: Diagnosis not present

## 2015-07-06 DIAGNOSIS — K219 Gastro-esophageal reflux disease without esophagitis: Secondary | ICD-10-CM | POA: Insufficient documentation

## 2015-07-06 DIAGNOSIS — M109 Gout, unspecified: Secondary | ICD-10-CM | POA: Diagnosis not present

## 2015-07-06 DIAGNOSIS — E785 Hyperlipidemia, unspecified: Secondary | ICD-10-CM | POA: Diagnosis not present

## 2015-07-06 DIAGNOSIS — Z79899 Other long term (current) drug therapy: Secondary | ICD-10-CM | POA: Diagnosis not present

## 2015-07-06 DIAGNOSIS — R197 Diarrhea, unspecified: Secondary | ICD-10-CM | POA: Diagnosis not present

## 2015-07-06 DIAGNOSIS — Z86711 Personal history of pulmonary embolism: Secondary | ICD-10-CM | POA: Diagnosis not present

## 2015-07-06 DIAGNOSIS — Z8673 Personal history of transient ischemic attack (TIA), and cerebral infarction without residual deficits: Secondary | ICD-10-CM | POA: Insufficient documentation

## 2015-07-06 DIAGNOSIS — Z7901 Long term (current) use of anticoagulants: Secondary | ICD-10-CM | POA: Diagnosis not present

## 2015-07-06 DIAGNOSIS — R1030 Lower abdominal pain, unspecified: Secondary | ICD-10-CM | POA: Diagnosis not present

## 2015-07-06 DIAGNOSIS — F329 Major depressive disorder, single episode, unspecified: Secondary | ICD-10-CM | POA: Diagnosis not present

## 2015-07-06 LAB — CBC
HCT: 40.1 % (ref 36.0–46.0)
Hemoglobin: 13.3 g/dL (ref 12.0–15.0)
MCH: 28.9 pg (ref 26.0–34.0)
MCHC: 33.2 g/dL (ref 30.0–36.0)
MCV: 87.2 fL (ref 78.0–100.0)
Platelets: 166 10*3/uL (ref 150–400)
RBC: 4.6 MIL/uL (ref 3.87–5.11)
RDW: 14.4 % (ref 11.5–15.5)
WBC: 5.3 10*3/uL (ref 4.0–10.5)

## 2015-07-06 MED ORDER — HYDROMORPHONE HCL 1 MG/ML IJ SOLN
0.5000 mg | Freq: Once | INTRAMUSCULAR | Status: AC
  Administered 2015-07-07: 0.5 mg via INTRAVENOUS
  Filled 2015-07-06: qty 1

## 2015-07-06 MED ORDER — ONDANSETRON HCL 4 MG/2ML IJ SOLN
4.0000 mg | Freq: Once | INTRAMUSCULAR | Status: AC
  Administered 2015-07-07: 4 mg via INTRAVENOUS
  Filled 2015-07-06: qty 2

## 2015-07-06 NOTE — ED Notes (Signed)
Pt comes from home via [facility], c/o lower abd pain X one day, pt states she vomited about 3 times today and diarrhea about 7 times today, c/o bloating that has been going on for months, along with diarrhea.

## 2015-07-06 NOTE — ED Provider Notes (Signed)
CSN: [id number]     Arrival date & time 07/06/15  2215 History  By signing my name below, I, Altamease Oiler, attest that this documentation has been prepared under the direction and in the presence of Merryl Hacker, MD. Electronically Signed: Altamease Oiler, ED Scribe. 07/06/2015. 11:46 PM   Chief Complaint  Patient presents with  . Abdominal Pain   The history is provided by the patient. No language interpreter was used.   Connie Ruiz is a 55 y.o. female with PMHx of C. Diff colitis, DM, HTN, HLD, CVA with residual right sided weakness, and CHF who presents to the Emergency Department complaining of ongoing, sharp, 10/10 in severity, lower left abdominal pain with onset months ago. Pt states that the pain worsened tonight and tramadol provided insufficient relief at home.  Associated symptoms include n/v/d for months. She has been evaluated by her GI doctor for these symptoms. Pt denies fever, cough, chest pain, and SOB.   Patient has been seen and evaluated multiple times including recently while in the emergency room. At that time she had a CT scan to evaluate for mesenteric ischemia which was reassuring.   Past Medical History  Diagnosis Date  . THYROID NODULE, LEFT 04/10/2009  . HYPERLIPIDEMIA 08/21/2007  . GOUT 08/21/2007  . HYPERTENSION 08/21/2007    Dr. Andree Elk, [place] 08/21/2007  . CEREBROVASCULAR ACCIDENT, ACUTE 04/15/2010  . GERD 08/21/2007  . RENAL INSUFFICIENCY 08/21/2007  . LUPUS 08/21/2007  . OSTEOPOROSIS 08/21/2007    [facility] at [facility]  . DVT, HX OF 08/21/2007  . CLOSTRIDIUM DIFFICILE COLITIS, HX OF 08/21/2007  . KIDNEY TRANSPLANTATION, HX OF 08/22/2007    s/p renal transplant-Dr. Andree Elk, [facility]  . Pulmonary embolism ([place]) 07/16/2010  . Renal failure   . Current use of long term anticoagulation     Dr. Andree Elk, [facility]  . Depression     Dr. Andree Elk, [facility]  . History of stroke with residual effects   . Right sided weakness   . CVA 04/17/2010   . Tachycardia   . DIABETES MELLITUS, TYPE II 08/21/2007  . Candida esophagitis ([place]) 11/12/2014  . Steroid-induced hyperglycemia 11/09/2014   Past Surgical History  Procedure Laterality Date  . Cholecystectomy    . Tubal ligation    . Kidney transplant Right 2009  . Renal biopsy, open  1981  . Enteroscopy N/A 11/11/2014    Procedure: ENTEROSCOPY;  Surgeon: Ladene Artist, MD;  Location: [facility];  Service: Endoscopy;  Laterality: N/A;   Family History  Problem Relation Age of Onset  . Heart attack Mother   . Heart disease Father   . Asthma Sister   . Asthma Sister   . Asthma Daughter   . Cancer Maternal Grandfather     prostate  . Cancer Paternal Grandfather     colon   Social History  Substance Use Topics  . Smoking status: Never Smoker   . Smokeless tobacco: Never Used  . Alcohol Use: No   OB History    No data available     Review of Systems  Constitutional: Negative for fever.  Respiratory: Negative for cough and shortness of breath.   Cardiovascular: Negative for chest pain.  Gastrointestinal: Positive for nausea, vomiting, abdominal pain and diarrhea.  Genitourinary: Negative for dysuria.  All other systems reviewed and are negative.   Allergies  Oxycodone-acetaminophen; Propoxyphene n-acetaminophen; Sulfonamide derivatives; Codeine; Other; Gabapentin; Latex; Metoprolol; and Morphine and related  Home Medications   Prior to  Admission medications   Medication Sig Start Date End Date Taking? Authorizing Provider  alendronate (FOSAMAX) 70 MG tablet Take 70 mg by mouth once a week. Take on Saturdays with a full glass of water on an empty stomach.    Historical Provider, MD  ammonium lactate (AMLACTIN) 12 % cream Apply 1 g topically 2 (two) times daily as needed for dry skin.     Historical Provider, MD  atorvastatin (LIPITOR) 40 MG tablet Take 40 mg by mouth daily. 03/21/15   Historical Provider, MD  Blood Glucose Monitoring Suppl (ONETOUCH VERIO IQ SYSTEM)  W/DEVICE KIT Use to check blood sugar 1 time per day 02/12/15   Renato Shin, MD  calcitRIOL (ROCALTROL) 0.25 MCG capsule TAKE ONE CAPSULE BY MOUTH DAILY 02/11/15   Renato Shin, MD  diltiazem (TIAZAC) 240 MG 24 hr capsule Take 1 capsule (240 mg total) by mouth daily. 10/04/14   Renato Shin, MD  donepezil (ARICEPT) 10 MG tablet Take 1 tablet (10 mg total) by mouth at bedtime. 05/08/15   Pieter Partridge, DO  esomeprazole (NEXIUM) 20 MG capsule Take 20 mg by mouth daily. 03/21/15   Historical Provider, MD  glucose blood (ONE TOUCH ULTRA TEST) test strip Use to check blood sugar 1 time per day 02/12/15   Renato Shin, MD  HYDROcodone-acetaminophen (NORCO/VICODIN) 5-325 MG tablet Take 1-2 tablets by mouth every 4 (four) hours as needed for moderate pain. 06/15/15   Merrily Pew, MD  losartan (COZAAR) 100 MG tablet Take 1 tablet (100 mg total) by mouth daily. 04/29/15   Renato Shin, MD  mirtazapine (REMERON) 15 MG tablet Take 1 tablet (15 mg total) by mouth at bedtime. 05/08/15   Pieter Partridge, DO  mycophenolate (CELLCEPT) 250 MG capsule Take 250 mg by mouth 2 (two) times daily.    Historical Provider, MD  nystatin (MYCOSTATIN) powder Apply 1 g topically 4 (four) times daily as needed. For yeast under breast    Historical Provider, MD  ondansetron (ZOFRAN ODT) 4 MG disintegrating tablet 59m ODT q4 hours prn nausea/vomiting 06/15/15   JMerrily Pew MD  ONorth Coast Endoscopy IncDELICA LANCETS 332KMISC Use to check blood sugar 1 time per day 02/12/15   SRenato Shin MD  oxybutynin (DITROPAN) 5 MG tablet Take 5 mg by mouth daily as needed for bladder spasms.     Historical Provider, MD  predniSONE (DELTASONE) 5 MG tablet Take 5 mg by mouth daily with breakfast.    Historical Provider, MD  sertraline (ZOLOFT) 100 MG tablet Take 50 mg by mouth daily. 05/30/15   Historical Provider, MD  tacrolimus (PROGRAF) 1 MG capsule Take 1 mg by mouth 2 (two) times daily.  04/16/14   Historical Provider, MD  traMADol (ULTRAM) 50 MG tablet Take 1 tablet (50  mg total) by mouth every 6 (six) hours as needed for moderate pain. 04/09/15   JOlam Idler MD  triamcinolone (KENALOG) 0.025 % cream Apply 1 application topically daily as needed (dry skin).     Historical Provider, MD  warfarin (COUMADIN) 2.5 MG tablet Take 2.5 mg by mouth daily. Except on Fridays take 531mtablet.    Historical Provider, MD  warfarin (COUMADIN) 5 MG tablet Take 1 tablet (5 mg total) by mouth daily. Patient taking differently: Take 5 mg by mouth every Friday.  02/22/15   Belkys A Regalado, MD   BP 154/94 mmHg  Pulse 96  Temp(Src) 98.7 F (37.1 C) (Oral)  Resp 18  SpO2 97% Physical Exam  Constitutional: She  is oriented to person, place, and time.  Chronically ill-appearing, overweight, no acute distress  HENT:  Head: Normocephalic and atraumatic.  Mouth/Throat: Oropharynx is clear and moist.  Cardiovascular: Normal rate, regular rhythm and normal heart sounds.   No murmur heard. Pulmonary/Chest: Effort normal and breath sounds normal. No respiratory distress. She has no wheezes.  Abdominal: Soft. Bowel sounds are normal. There is tenderness. There is guarding. There is no rebound.  Diffuse tenderness to palpation, voluntary guarding  Musculoskeletal:  Trace edema BLEs  Neurological: She is alert and oriented to person, place, and time.  Skin: Skin is warm and dry.  Psychiatric: She has a normal mood and affect.  Nursing note and vitals reviewed.   ED Course  Procedures (including critical care time) DIAGNOSTIC STUDIES: Oxygen Saturation is 97% on RA,  normal by my interpretation.    COORDINATION OF CARE: 11:36 PM Discussed treatment plan which includes lab work, Zofran, and Dilaudid with pt at bedside and pt agreed to plan.  Labs Review Labs Reviewed  COMPREHENSIVE METABOLIC PANEL - Abnormal; Notable for the following:    Potassium 3.2 (*)    Glucose, Bld 103 (*)    Total Protein 5.7 (*)    Albumin 2.8 (*)    ALT 9 (*)    All other components within  normal limits  URINALYSIS, ROUTINE W REFLEX MICROSCOPIC (NOT AT [facility]) - Abnormal; Notable for the following:    Color, Urine AMBER (*)    APPearance CLOUDY (*)    Bilirubin Urine SMALL (*)    Ketones, ur 15 (*)    Protein, ur 30 (*)    Leukocytes, UA SMALL (*)    All other components within normal limits  URINE MICROSCOPIC-ADD ON - Abnormal; Notable for the following:    Squamous Epithelial / LPF 6-30 (*)    Bacteria, UA FEW (*)    Crystals CA OXALATE CRYSTALS (*)    All other components within normal limits  LIPASE, BLOOD  CBC  I-STAT CG4 LACTIC ACID, ED  I-STAT CG4 LACTIC ACID, ED    Imaging Review No results found. I have personally reviewed and evaluated these lab results as part of my medical decision-making.   EKG Interpretation None      MDM   Final diagnoses:  Generalized abdominal pain    Patient presents with abdominal pain.  History of same. Recurrent in nature. Ongoing. Was seen and evaluated for similar symptoms at the beginning of January. At that time was noted to have pain out of proportion to her exam. She had a CT and she is of the abdomen to rule out mesentery ischemia. This was reassuring. She has been followed by GI. It is unclear what interventions or testing have been done. She continues to have pain. She has voluntary guarding. No signs of peritonitis. Lactate is normal. Basic labwork otherwise largely reassuring. She was initially given pain and nausea medication. She was observed in the ER for several hours. On multiple rechecks she is resting comfortably. She did not require any further pain medication. She is able to tolerate food. Pain appears acute on chronic. Discussed with patient that she will need follow-up with her GI doctor. Doubt acute emergent process at this time. No further imaging obtained today.  After history, exam, and medical workup I feel the patient has been appropriately medically screened and is safe for discharge home. Pertinent  diagnoses were discussed with the patient. Patient was given return precautions.  I personally performed the services  described in this documentation, which was scribed in my presence. The recorded information has been reviewed and is accurate.    Merryl Hacker, MD 07/07/15 [phone]

## 2015-07-07 ENCOUNTER — Ambulatory Visit: Payer: Medicare Other | Admitting: Endocrinology

## 2015-07-07 DIAGNOSIS — R1084 Generalized abdominal pain: Secondary | ICD-10-CM | POA: Diagnosis not present

## 2015-07-07 DIAGNOSIS — M150 Primary generalized (osteo)arthritis: Secondary | ICD-10-CM | POA: Diagnosis not present

## 2015-07-07 LAB — URINE MICROSCOPIC-ADD ON

## 2015-07-07 LAB — URINALYSIS, ROUTINE W REFLEX MICROSCOPIC
Glucose, UA: NEGATIVE mg/dL
Hgb urine dipstick: NEGATIVE
Ketones, ur: 15 mg/dL — AB
Nitrite: NEGATIVE
Protein, ur: 30 mg/dL — AB
Specific Gravity, Urine: 1.023 (ref 1.005–1.030)
pH: 6.5 (ref 5.0–8.0)

## 2015-07-07 LAB — COMPREHENSIVE METABOLIC PANEL
ALT: 9 U/L — ABNORMAL LOW (ref 14–54)
AST: 24 U/L (ref 15–41)
Albumin: 2.8 g/dL — ABNORMAL LOW (ref 3.5–5.0)
Alkaline Phosphatase: 54 U/L (ref 38–126)
Anion gap: 13 (ref 5–15)
BUN: 7 mg/dL (ref 6–20)
CO2: 23 mmol/L (ref 22–32)
Calcium: 9.3 mg/dL (ref 8.9–10.3)
Chloride: 105 mmol/L (ref 101–111)
Creatinine, Ser: 0.7 mg/dL (ref 0.44–1.00)
GFR calc Af Amer: >60 mL/min (ref >60)
GFR calc non Af Amer: >60 mL/min (ref >60)
Glucose, Bld: 103 mg/dL — ABNORMAL HIGH (ref 65–99)
Potassium: 3.2 mmol/L — ABNORMAL LOW (ref 3.5–5.1)
Sodium: 141 mmol/L (ref 135–145)
Total Bilirubin: 0.6 mg/dL (ref 0.3–1.2)
Total Protein: 5.7 g/dL — ABNORMAL LOW (ref 6.5–8.1)

## 2015-07-07 LAB — I-STAT CG4 LACTIC ACID, ED: Lactic Acid, Venous: 0.81 mmol/L (ref 0.5–2.0)

## 2015-07-07 LAB — LIPASE, BLOOD: Lipase: 29 U/L (ref 11–51)

## 2015-07-07 MED ORDER — TRAMADOL HCL 50 MG PO TABS: 50.0000 mg | ORAL_TABLET | Freq: Three times a day (TID) | ORAL | Status: DC | PRN

## 2015-07-07 NOTE — Telephone Encounter (Signed)
i printed Ov is due 

## 2015-07-07 NOTE — Discharge Instructions (Signed)

## 2015-07-08 ENCOUNTER — Telehealth: Payer: Self-pay | Admitting: Endocrinology

## 2015-07-08 DIAGNOSIS — M150 Primary generalized (osteo)arthritis: Secondary | ICD-10-CM | POA: Diagnosis not present

## 2015-07-08 NOTE — Telephone Encounter (Signed)
Patient no showed today's appt. Please advise on how to follow up. °A. No follow up necessary. °B. Follow up urgent. Contact patient immediately. °C. Follow up necessary. Contact patient and schedule visit in ___ days. °D. Follow up advised. Contact patient and schedule visit in ____weeks. ° °

## 2015-07-08 NOTE — Telephone Encounter (Signed)
Please come back for a follow-up appointment in 2 weeks

## 2015-07-08 NOTE — Telephone Encounter (Signed)
Pt rescheduled for 07/31/2015 at 9 am.

## 2015-07-09 DIAGNOSIS — M150 Primary generalized (osteo)arthritis: Secondary | ICD-10-CM | POA: Diagnosis not present

## 2015-07-10 DIAGNOSIS — E1122 Type 2 diabetes mellitus with diabetic chronic kidney disease: Secondary | ICD-10-CM | POA: Diagnosis not present

## 2015-07-10 DIAGNOSIS — Z5181 Encounter for therapeutic drug level monitoring: Secondary | ICD-10-CM | POA: Diagnosis not present

## 2015-07-10 DIAGNOSIS — I5032 Chronic diastolic (congestive) heart failure: Secondary | ICD-10-CM | POA: Diagnosis not present

## 2015-07-10 DIAGNOSIS — I130 Hypertensive heart and chronic kidney disease with heart failure and stage 1 through stage 4 chronic kidney disease, or unspecified chronic kidney disease: Secondary | ICD-10-CM | POA: Diagnosis not present

## 2015-07-10 DIAGNOSIS — M150 Primary generalized (osteo)arthritis: Secondary | ICD-10-CM | POA: Diagnosis not present

## 2015-07-11 DIAGNOSIS — M150 Primary generalized (osteo)arthritis: Secondary | ICD-10-CM | POA: Diagnosis not present

## 2015-07-12 DIAGNOSIS — M150 Primary generalized (osteo)arthritis: Secondary | ICD-10-CM | POA: Diagnosis not present

## 2015-07-13 DIAGNOSIS — M150 Primary generalized (osteo)arthritis: Secondary | ICD-10-CM | POA: Diagnosis not present

## 2015-07-17 ENCOUNTER — Encounter: Payer: Self-pay | Admitting: Neurology

## 2015-07-17 DIAGNOSIS — I5032 Chronic diastolic (congestive) heart failure: Secondary | ICD-10-CM | POA: Diagnosis not present

## 2015-07-17 DIAGNOSIS — Z5181 Encounter for therapeutic drug level monitoring: Secondary | ICD-10-CM | POA: Diagnosis not present

## 2015-07-17 DIAGNOSIS — I130 Hypertensive heart and chronic kidney disease with heart failure and stage 1 through stage 4 chronic kidney disease, or unspecified chronic kidney disease: Secondary | ICD-10-CM | POA: Diagnosis not present

## 2015-07-17 DIAGNOSIS — E1122 Type 2 diabetes mellitus with diabetic chronic kidney disease: Secondary | ICD-10-CM | POA: Diagnosis not present

## 2015-07-18 ENCOUNTER — Encounter (HOSPITAL_COMMUNITY): Payer: Self-pay | Admitting: Emergency Medicine

## 2015-07-18 ENCOUNTER — Emergency Department (HOSPITAL_COMMUNITY)
Admission: EM | Admit: 2015-07-18 | Discharge: 2015-07-18 | Disposition: A | Discharge status: Home / Self Care | Payer: Medicare Other | Attending: Emergency Medicine | Admitting: Emergency Medicine

## 2015-07-18 DIAGNOSIS — Z91048 Other nonmedicinal substance allergy status: Secondary | ICD-10-CM | POA: Diagnosis not present

## 2015-07-18 DIAGNOSIS — E785 Hyperlipidemia, unspecified: Secondary | ICD-10-CM | POA: Insufficient documentation

## 2015-07-18 DIAGNOSIS — N12 Tubulo-interstitial nephritis, not specified as acute or chronic: Secondary | ICD-10-CM | POA: Diagnosis not present

## 2015-07-18 DIAGNOSIS — I10 Essential (primary) hypertension: Secondary | ICD-10-CM | POA: Insufficient documentation

## 2015-07-18 DIAGNOSIS — Z86711 Personal history of pulmonary embolism: Secondary | ICD-10-CM | POA: Diagnosis not present

## 2015-07-18 DIAGNOSIS — Z885 Allergy status to narcotic agent status: Secondary | ICD-10-CM | POA: Diagnosis not present

## 2015-07-18 DIAGNOSIS — Z888 Allergy status to other drugs, medicaments and biological substances status: Secondary | ICD-10-CM | POA: Diagnosis not present

## 2015-07-18 DIAGNOSIS — G8929 Other chronic pain: Secondary | ICD-10-CM | POA: Insufficient documentation

## 2015-07-18 DIAGNOSIS — R1032 Left lower quadrant pain: Secondary | ICD-10-CM | POA: Diagnosis not present

## 2015-07-18 DIAGNOSIS — Z9049 Acquired absence of other specified parts of digestive tract: Secondary | ICD-10-CM | POA: Insufficient documentation

## 2015-07-18 DIAGNOSIS — F329 Major depressive disorder, single episode, unspecified: Secondary | ICD-10-CM | POA: Insufficient documentation

## 2015-07-18 DIAGNOSIS — Z8619 Personal history of other infectious and parasitic diseases: Secondary | ICD-10-CM | POA: Insufficient documentation

## 2015-07-18 DIAGNOSIS — M150 Primary generalized (osteo)arthritis: Secondary | ICD-10-CM | POA: Diagnosis not present

## 2015-07-18 DIAGNOSIS — Z91040 Latex allergy status: Secondary | ICD-10-CM | POA: Diagnosis not present

## 2015-07-18 DIAGNOSIS — R112 Nausea with vomiting, unspecified: Secondary | ICD-10-CM | POA: Diagnosis not present

## 2015-07-18 DIAGNOSIS — Z79899 Other long term (current) drug therapy: Secondary | ICD-10-CM | POA: Insufficient documentation

## 2015-07-18 DIAGNOSIS — N2889 Other specified disorders of kidney and ureter: Secondary | ICD-10-CM | POA: Diagnosis not present

## 2015-07-18 DIAGNOSIS — Z9851 Tubal ligation status: Secondary | ICD-10-CM | POA: Diagnosis not present

## 2015-07-18 DIAGNOSIS — K219 Gastro-esophageal reflux disease without esophagitis: Secondary | ICD-10-CM | POA: Diagnosis not present

## 2015-07-18 DIAGNOSIS — R109 Unspecified abdominal pain: Secondary | ICD-10-CM | POA: Diagnosis not present

## 2015-07-18 DIAGNOSIS — E119 Type 2 diabetes mellitus without complications: Secondary | ICD-10-CM | POA: Diagnosis not present

## 2015-07-18 DIAGNOSIS — Z7952 Long term (current) use of systemic steroids: Secondary | ICD-10-CM | POA: Diagnosis not present

## 2015-07-18 DIAGNOSIS — Z7901 Long term (current) use of anticoagulants: Secondary | ICD-10-CM | POA: Diagnosis not present

## 2015-07-18 DIAGNOSIS — Z8673 Personal history of transient ischemic attack (TIA), and cerebral infarction without residual deficits: Secondary | ICD-10-CM | POA: Diagnosis not present

## 2015-07-18 DIAGNOSIS — I509 Heart failure, unspecified: Secondary | ICD-10-CM | POA: Insufficient documentation

## 2015-07-18 DIAGNOSIS — M329 Systemic lupus erythematosus, unspecified: Secondary | ICD-10-CM | POA: Diagnosis not present

## 2015-07-18 DIAGNOSIS — N186 End stage renal disease: Secondary | ICD-10-CM | POA: Diagnosis not present

## 2015-07-18 DIAGNOSIS — Z940 Kidney transplant status: Secondary | ICD-10-CM | POA: Diagnosis not present

## 2015-07-18 DIAGNOSIS — D6861 Antiphospholipid syndrome: Secondary | ICD-10-CM | POA: Diagnosis not present

## 2015-07-18 DIAGNOSIS — N390 Urinary tract infection, site not specified: Secondary | ICD-10-CM

## 2015-07-18 DIAGNOSIS — R197 Diarrhea, unspecified: Secondary | ICD-10-CM | POA: Diagnosis not present

## 2015-07-18 DIAGNOSIS — M810 Age-related osteoporosis without current pathological fracture: Secondary | ICD-10-CM | POA: Insufficient documentation

## 2015-07-18 DIAGNOSIS — K2970 Gastritis, unspecified, without bleeding: Secondary | ICD-10-CM | POA: Diagnosis not present

## 2015-07-18 DIAGNOSIS — R451 Restlessness and agitation: Secondary | ICD-10-CM | POA: Diagnosis not present

## 2015-07-18 DIAGNOSIS — Z881 Allergy status to other antibiotic agents status: Secondary | ICD-10-CM | POA: Diagnosis not present

## 2015-07-18 DIAGNOSIS — R10819 Abdominal tenderness, unspecified site: Secondary | ICD-10-CM | POA: Diagnosis not present

## 2015-07-18 LAB — URINALYSIS, ROUTINE W REFLEX MICROSCOPIC
Glucose, UA: NEGATIVE mg/dL
Ketones, ur: NEGATIVE mg/dL
Nitrite: POSITIVE — AB
Protein, ur: 30 mg/dL — AB
Specific Gravity, Urine: 1.026 (ref 1.005–1.030)
pH: 6 (ref 5.0–8.0)

## 2015-07-18 LAB — URINE MICROSCOPIC-ADD ON

## 2015-07-18 LAB — COMPREHENSIVE METABOLIC PANEL
ALT: 11 U/L — ABNORMAL LOW (ref 14–54)
AST: 25 U/L (ref 15–41)
Albumin: 3.3 g/dL — ABNORMAL LOW (ref 3.5–5.0)
Alkaline Phosphatase: 62 U/L (ref 38–126)
Anion gap: 9 (ref 5–15)
BUN: 12 mg/dL (ref 6–20)
CO2: 23 mmol/L (ref 22–32)
Calcium: 9.3 mg/dL (ref 8.9–10.3)
Chloride: 110 mmol/L (ref 101–111)
Creatinine, Ser: 0.67 mg/dL (ref 0.44–1.00)
GFR calc Af Amer: >60 mL/min (ref >60)
GFR calc non Af Amer: >60 mL/min (ref >60)
Glucose, Bld: 86 mg/dL (ref 65–99)
Potassium: 3.3 mmol/L — ABNORMAL LOW (ref 3.5–5.1)
Sodium: 142 mmol/L (ref 135–145)
Total Bilirubin: 0.9 mg/dL (ref 0.3–1.2)
Total Protein: 6.3 g/dL — ABNORMAL LOW (ref 6.5–8.1)

## 2015-07-18 LAB — CBC WITH DIFFERENTIAL/PLATELET
Basophils Absolute: 0 10*3/uL (ref 0.0–0.1)
Basophils Relative: 1 %
Eosinophils Absolute: 0.1 10*3/uL (ref 0.0–0.7)
Eosinophils Relative: 2 %
HCT: 42.3 % (ref 36.0–46.0)
Hemoglobin: 13.6 g/dL (ref 12.0–15.0)
Lymphocytes Relative: 51 %
Lymphs Abs: 2.7 10*3/uL (ref 0.7–4.0)
MCH: 29.2 pg (ref 26.0–34.0)
MCHC: 32.2 g/dL (ref 30.0–36.0)
MCV: 90.8 fL (ref 78.0–100.0)
Monocytes Absolute: 0.5 10*3/uL (ref 0.1–1.0)
Monocytes Relative: 9 %
Neutro Abs: 2 10*3/uL (ref 1.7–7.7)
Neutrophils Relative %: 37 %
Platelets: 199 10*3/uL (ref 150–400)
RBC: 4.66 MIL/uL (ref 3.87–5.11)
RDW: 15.1 % (ref 11.5–15.5)
WBC: 5.2 10*3/uL (ref 4.0–10.5)

## 2015-07-18 LAB — LIPASE, BLOOD: Lipase: 23 U/L (ref 11–51)

## 2015-07-18 MED ORDER — ONDANSETRON HCL 4 MG/2ML IJ SOLN
4.0000 mg | Freq: Once | INTRAMUSCULAR | Status: AC
  Administered 2015-07-18: 4 mg via INTRAVENOUS
  Filled 2015-07-18: qty 2

## 2015-07-18 MED ORDER — TRAMADOL HCL 50 MG PO TABS: 100.0000 mg | ORAL_TABLET | Freq: Four times a day (QID) | ORAL | Status: DC | PRN

## 2015-07-18 MED ORDER — CEPHALEXIN 500 MG PO CAPS: 500.0000 mg | ORAL_CAPSULE | Freq: Four times a day (QID) | ORAL | Status: DC

## 2015-07-18 MED ORDER — TRAMADOL HCL 50 MG PO TABS
100.0000 mg | ORAL_TABLET | Freq: Once | ORAL | Status: AC
  Administered 2015-07-18: 100 mg via ORAL
  Filled 2015-07-18: qty 2

## 2015-07-18 MED ORDER — SODIUM CHLORIDE 0.9 % IV SOLN
INTRAVENOUS | Status: DC
  Administered 2015-07-18: 08:00:00 via INTRAVENOUS

## 2015-07-18 MED ORDER — CEPHALEXIN 500 MG PO CAPS: 1000.0000 mg | ORAL_CAPSULE | Freq: Two times a day (BID) | ORAL | Status: DC

## 2015-07-18 NOTE — ED Notes (Signed)
Charge RN Charlann Boxer speaking with daughter

## 2015-07-18 NOTE — ED Provider Notes (Signed)
CSN: [id number]     Arrival date & time 07/18/15  0619 History   First MD Initiated Contact with Patient 07/18/15 [phone]     Chief Complaint  Patient presents with  . Abdominal Pain       . Emesis  . Diarrhea     (Consider location/radiation/quality/duration/timing/severity/associated sxs/prior Treatment) HPI Left lower quadrant pain present for a week. Patient does endorse that this is chronic and never actually gone. Patient endorses episodes of vomiting but does not specify how many. She also endorses diarrhea without specifying quantity. No fever. No urinary symptoms. Nothing improves the pain. Worse with pressure or movement but present at rest. Past Medical History  Diagnosis Date  . THYROID NODULE, LEFT 04/10/2009  . HYPERLIPIDEMIA 08/21/2007  . GOUT 08/21/2007  . HYPERTENSION 08/21/2007    Dr. Andree Elk, [place] 08/21/2007  . CEREBROVASCULAR ACCIDENT, ACUTE 04/15/2010  . GERD 08/21/2007  . RENAL INSUFFICIENCY 08/21/2007  . LUPUS 08/21/2007  . OSTEOPOROSIS 08/21/2007    [facility]umatol at [facility]  . DVT, HX OF 08/21/2007  . CLOSTRIDIUM DIFFICILE COLITIS, HX OF 08/21/2007  . KIDNEY TRANSPLANTATION, HX OF 08/22/2007    s/p renal transplant-Dr. Andree Elk, [facility]  . Pulmonary embolism ([place]) 07/16/2010  . Renal failure   . Current use of long term anticoagulation     Dr. Andree Elk, [facility]  . Depression     Dr. Andree Elk, [facility]  . History of stroke with residual effects   . Right sided weakness   . CVA 04/17/2010  . Tachycardia   . DIABETES MELLITUS, TYPE II 08/21/2007  . Candida esophagitis ([place]) 11/12/2014  . Steroid-induced hyperglycemia 11/09/2014   Past Surgical History  Procedure Laterality Date  . Cholecystectomy    . Tubal ligation    . Kidney transplant Right 2009  . Renal biopsy, open  1981  . Enteroscopy N/A 11/11/2014    Procedure: ENTEROSCOPY;  Surgeon: Ladene Artist, MD;  Location: [facility];  Service: Endoscopy;  Laterality: N/A;   Family History   Problem Relation Age of Onset  . Heart attack Mother   . Heart disease Father   . Asthma Sister   . Asthma Sister   . Asthma Daughter   . Cancer Maternal Grandfather     prostate  . Cancer Paternal Grandfather     colon   Social History  Substance Use Topics  . Smoking status: Never Smoker   . Smokeless tobacco: Never Used  . Alcohol Use: No   OB History    No data available     Review of Systems  10 Systems reviewed and are negative for acute change except as noted in the HPI.   Allergies  Oxycodone-acetaminophen; Propoxyphene n-acetaminophen; Sulfonamide derivatives; Adhesive; Codeine; Other; Gabapentin; Latex; Metoprolol; and Morphine and related  Home Medications   Prior to Admission medications   Medication Sig Start Date End Date Taking? Authorizing Provider  sertraline (ZOLOFT) 100 MG tablet Take 50 mg by mouth daily. 05/30/15  Yes Historical Provider, MD  tacrolimus (PROGRAF) 1 MG capsule Take 1 mg by mouth 2 (two) times daily.  04/16/14  Yes Historical Provider, MD  triamcinolone (KENALOG) 0.025 % cream Apply 1 application topically daily as needed (dry skin).    Yes Historical Provider, MD  warfarin (COUMADIN) 2.5 MG tablet Take 2.5 mg by mouth daily.    Yes Historical Provider, MD  alendronate (FOSAMAX) 70 MG tablet Take 70 mg by mouth once a week. Take on Saturdays with a full  glass of water on an empty stomach.    Historical Provider, MD  ammonium lactate (AMLACTIN) 12 % cream Apply 1 g topically 2 (two) times daily as needed for dry skin.     Historical Provider, MD  atorvastatin (LIPITOR) 40 MG tablet Take 40 mg by mouth daily. 03/21/15   Historical Provider, MD  Blood Glucose Monitoring Suppl (ONETOUCH VERIO IQ SYSTEM) W/DEVICE KIT Use to check blood sugar 1 time per day 02/12/15   Sean Ellison, MD  calcitRIOL (ROCALTROL) 0.25 MCG capsule TAKE ONE CAPSULE BY MOUTH DAILY 02/11/15   Sean Ellison, MD  cephALEXin (KEFLEX) 500 MG capsule Take 1 capsule (500 mg  total) by mouth 4 (four) times daily. 07/18/15    , MD  diltiazem (TIAZAC) 240 MG 24 hr capsule Take 1 capsule (240 mg total) by mouth daily. 10/04/14   Sean Ellison, MD  donepezil (ARICEPT) 10 MG tablet Take 1 tablet (10 mg total) by mouth at bedtime. 05/08/15   Adam R Jaffe, DO  esomeprazole (NEXIUM) 20 MG capsule Take 20 mg by mouth daily. 03/21/15   Historical Provider, MD  glucose blood (ONE TOUCH ULTRA TEST) test strip Use to check blood sugar 1 time per day 02/12/15   Sean Ellison, MD  HYDROcodone-acetaminophen (NORCO/VICODIN) 5-325 MG tablet Take 1-2 tablets by mouth every 4 (four) hours as needed for moderate pain. 06/15/15   Jason Mesner, MD  losartan (COZAAR) 100 MG tablet Take 1 tablet (100 mg total) by mouth daily. 04/29/15   Sean Ellison, MD  mirtazapine (REMERON) 15 MG tablet Take 1 tablet (15 mg total) by mouth at bedtime. 05/08/15   Adam R Jaffe, DO  mycophenolate (CELLCEPT) 250 MG capsule Take 250 mg by mouth 2 (two) times daily.    Historical Provider, MD  nystatin (MYCOSTATIN) powder Apply 1 g topically 4 (four) times daily as needed. For yeast under breast    Historical Provider, MD  ondansetron (ZOFRAN ODT) 4 MG disintegrating tablet 4mg ODT q4 hours prn nausea/vomiting 06/15/15   Jason Mesner, MD  ONETOUCH DELICA LANCETS 33G MISC Use to check blood sugar 1 time per day 02/12/15   Sean Ellison, MD  oxybutynin (DITROPAN) 5 MG tablet Take 5 mg by mouth daily as needed for bladder spasms.     Historical Provider, MD  traMADol (ULTRAM) 50 MG tablet Take 1 tablet (50 mg total) by mouth every 8 (eight) hours as needed. 07/07/15   Sean Ellison, MD  warfarin (COUMADIN) 5 MG tablet Take 1 tablet (5 mg total) by mouth daily. Patient taking differently: Take 5 mg by mouth every Friday.  02/22/15   Belkys A Regalado, MD   BP 162/95 mmHg  Pulse 76  Temp(Src) 98.2 F (36.8 C) (Oral)  Resp 18  SpO2 97% Physical Exam  Constitutional: She is oriented to person, place, and time.  Alert  and nontoxic. No respiratory distress. Patient constantly moaning in pain.  HENT:  Head: Normocephalic and atraumatic.  Nose: Nose normal.  Mouth/Throat: Oropharynx is clear and moist.  Eyes: EOM are normal. Pupils are equal, round, and reactive to light. Right eye exhibits no discharge. Left eye exhibits no discharge. No scleral icterus.  Neck: Neck supple.  Cardiovascular: Normal rate, regular rhythm, normal heart sounds and intact distal pulses.   Pulmonary/Chest: Effort normal and breath sounds normal.  Abdominal: Soft. Bowel sounds are normal. She exhibits no distension. There is tenderness.  Initially patient was endorsing pain all over the abdomen and aggressively pushed my hand away.   I advised the patient that was necessary to perform the examination to determine location and severity of pain and determine further treatment. At this time she'll on examination and endorsed only pain in the left lower quadrant. Specifically denying pain in the epigastrium and right abdomen. Beneath the pannus fold there is smegma  formation with a mild amount of erythema. No significant skin maceration.  Musculoskeletal: Normal range of motion. She exhibits edema and tenderness.  Trace to 1+ edema bilateral ankles. No wounds or cellulitis to the feet. Skin thinning. Significant nail thickening. Very atrophic musculature of the lower extremities.  Neurological: She is alert and oriented to person, place, and time. She has normal strength. Coordination normal. GCS eye subscore is 4. GCS verbal subscore is 5. GCS motor subscore is 6.  Skin: Skin is warm, dry and intact.  Psychiatric: She has a normal mood and affect.    ED Course  Procedures (including critical care time) Labs Review Labs Reviewed  COMPREHENSIVE METABOLIC PANEL - Abnormal; Notable for the following:    Potassium 3.3 (*)    Total Protein 6.3 (*)    Albumin 3.3 (*)    ALT 11 (*)    All other components within normal limits  URINALYSIS,  ROUTINE W REFLEX MICROSCOPIC (NOT AT [facility]) - Abnormal; Notable for the following:    Color, Urine ORANGE (*)    APPearance CLOUDY (*)    Hgb urine dipstick MODERATE (*)    Bilirubin Urine SMALL (*)    Protein, ur 30 (*)    Nitrite POSITIVE (*)    Leukocytes, UA MODERATE (*)    All other components within normal limits  URINE MICROSCOPIC-ADD ON - Abnormal; Notable for the following:    Squamous Epithelial / LPF 0-5 (*)    Bacteria, UA MANY (*)    All other components within normal limits  URINE CULTURE  LIPASE, BLOOD  CBC WITH DIFFERENTIAL/PLATELET    Imaging Review No results found. I have personally reviewed and evaluated these images and lab results as part of my medical decision-making.   EKG Interpretation None      MDM   Final diagnoses:  Chronic abdominal pain  UTI (lower urinary tract infection)   At this point I feel that patient's pain presentation is consistent with a chronic pain as per EMR review. There have been multiple presentations and evaluations for left lower quadrant pain. This includes a CT angio to rule out mesenteric ischemic at the beginning of January. There were 5 CT abdomen scans done in 2016. Patient has been evaluated and treated at [facility] and had gastroenterology evaluation with colonoscopy last year. At this time I do not find any acute changes as compared to pain that is documented as having been present since at least May of last year making that a total of 9-1/2 months. Diagnostic tests are normal except UA which does test positive for UTI. Given his this is both nitrate and leuk esterase positive, I will elect to treat this. Patient does not give symptoms of GU pain.   I explained to the patient upon arrival that I did intend to make sure that she did not have any acute problems with her abdominal pain however identifying the cause would be very unlikely given the extent of diagnostic evaluation and consultation she has had at this point. The  patient's daughter later arrived after completed workup. She was angry and demanding to know why her mother had pain and that something had to be  done about it. She reported "she cannot go on in pain like this". The patient's daughter requested I give her a milligram of Dilaudid. I explained that this would be a very temporary pain relief and I did not feel that narcotic management was appropriate as an emergency department approach given his this is chronic pain. She wanted the patient seen by a gastroenterologist in the emergency department or perhaps admitted to do a bowel prep for a colonoscopy. I splinted these were not inpatient treatments and that she would have to continue to see her specialist and primary care providers for additional diagnostic in management care. She was very angry and demanded to see the supervisors. This request is being accommodated.  Charlesetta Shanks, MD 07/18/15 [phone]

## 2015-07-18 NOTE — ED Notes (Signed)
Per EMS pt is c/o generalized abd pain worse in the left lower quadrant with N/V/D for a week  Pt states she has had several diarrhea stools on Thursday  Pt states she is weak all over   Pt was seen for something similar at [facility] the end of Jan and was given zofran and tramadol  Pt states she is out of zofran and the tramadol did not help her pain

## 2015-07-18 NOTE — ED Notes (Signed)
House [facility] remains in room with pt and daughter. Leadership Marissa Calamity RN to verify.

## 2015-07-18 NOTE — ED Notes (Signed)
Patient's daughter has refused discharge due to patient not receiving medications of choice. Patient was offered medication for pain in which she and daughter declined. Patient was advised to follow up with PCP to address medication needs for chronic pain. Lynnda Shields, AD went in to speak with patient and daughter in which the daughter preceded to yell at her demanding to speak to someone else. The nursing supervisors Margarita Grizzle & Neita Garnet) has been notified and has arrived to speak with patient and daughter at this time.

## 2015-07-18 NOTE — Discharge Instructions (Signed)
Abdominal Pain, Adult °Many things can cause abdominal pain. Usually, abdominal pain is not caused by a disease and will improve without treatment. It can often be observed and treated at home. Your health care provider will do a physical exam and possibly order blood tests and X-rays to help determine the seriousness of your pain. However, in many cases, more time must pass before a clear cause of the pain can be found. Before that point, your health care provider may not know if you need more testing or further treatment. °HOME CARE INSTRUCTIONS °Monitor your abdominal pain for any changes. The following actions may help to alleviate any discomfort you are experiencing: °· Only take over-the-counter or prescription medicines as directed by your health care provider. °· Do not take laxatives unless directed to do so by your health care provider. °· Try a clear liquid diet (broth, tea, or water) as directed by your health care provider. Slowly move to a bland diet as tolerated. °SEEK MEDICAL CARE IF: °· You have unexplained abdominal pain. °· You have abdominal pain associated with nausea or diarrhea. °· You have pain when you urinate or have a bowel movement. °· You experience abdominal pain that wakes you in the night. °· You have abdominal pain that is worsened or improved by eating food. °· You have abdominal pain that is worsened with eating fatty foods. °· You have a fever. °SEEK IMMEDIATE MEDICAL CARE IF: °· Your pain does not go away within 2 hours. °· You keep throwing up (vomiting). °· Your pain is felt only in portions of the abdomen, such as the right side or the left lower portion of the abdomen. °· You pass bloody or black tarry stools. °MAKE SURE YOU: °· Understand these instructions. °· Will watch your condition. °· Will get help right away if you are not doing well or get worse. °  °This information is not intended to replace advice given to you by your health care provider. Make sure you discuss  any questions you have with your health care provider. °  °Document Released: 03/03/2005 Document Revised: 02/12/2015 Document Reviewed: 01/31/2013 °Elsevier Interactive Patient Education ©2016 Elsevier Inc. °Urinary Tract Infection °Urinary tract infections (UTIs) can develop anywhere along your urinary tract. Your urinary tract is your body's drainage system for removing wastes and extra water. Your urinary tract includes two kidneys, two ureters, a bladder, and a urethra. Your kidneys are a pair of bean-shaped organs. Each kidney is about the size of your fist. They are located below your ribs, one on each side of your spine. °CAUSES °Infections are caused by microbes, which are microscopic organisms, including fungi, viruses, and bacteria. These organisms are so small that they can only be seen through a microscope. Bacteria are the microbes that most commonly cause UTIs. °SYMPTOMS  °Symptoms of UTIs may vary by age and gender of the patient and by the location of the infection. Symptoms in young women typically include a frequent and intense urge to urinate and a painful, burning feeling in the bladder or urethra during urination. Older women and men are more likely to be tired, shaky, and weak and have muscle aches and abdominal pain. A fever may mean the infection is in your kidneys. Other symptoms of a kidney infection include pain in your back or sides below the ribs, nausea, and vomiting. °DIAGNOSIS °To diagnose a UTI, your caregiver will ask you about your symptoms. Your caregiver will also ask you to provide a urine sample. The   urine sample will be tested for bacteria and white blood cells. White blood cells are made by your body to help fight infection. °TREATMENT  °Typically, UTIs can be treated with medication. Because most UTIs are caused by a bacterial infection, they usually can be treated with the use of antibiotics. The choice of antibiotic and length of treatment depend on your symptoms and the  type of bacteria causing your infection. °HOME CARE INSTRUCTIONS °· If you were prescribed antibiotics, take them exactly as your caregiver instructs you. Finish the medication even if you feel better after you have only taken some of the medication. °· Drink enough water and fluids to keep your urine clear or pale yellow. °· Avoid caffeine, tea, and carbonated beverages. They tend to irritate your bladder. °· Empty your bladder often. Avoid holding urine for long periods of time. °· Empty your bladder before and after sexual intercourse. °· After a bowel movement, women should cleanse from front to back. Use each tissue only once. °SEEK MEDICAL CARE IF:  °· You have back pain. °· You develop a fever. °· Your symptoms do not begin to resolve within 3 days. °SEEK IMMEDIATE MEDICAL CARE IF:  °· You have severe back pain or lower abdominal pain. °· You develop chills. °· You have nausea or vomiting. °· You have continued burning or discomfort with urination. °MAKE SURE YOU:  °· Understand these instructions. °· Will watch your condition. °· Will get help right away if you are not doing well or get worse. °  °This information is not intended to replace advice given to you by your health care provider. Make sure you discuss any questions you have with your health care provider. °  °Document Released: 03/03/2005 Document Revised: 02/12/2015 Document Reviewed: 07/02/2011 °Elsevier Interactive Patient Education ©2016 Elsevier Inc. ° °

## 2015-07-18 NOTE — ED Notes (Signed)
I was asked to speak with the patient and her daughter regarding their dissatisfaction with not receiving narcotic medications for her chronic abd pain.  They are demanding that the emergency department "figure out" what is wrong with her.  It was explained that the patient has had several radiologic exams, blood tests, etc over the past year that have all come back negative.  Daughter demanded that pt be admitted until "yall figure it out".  The purpose of the emergency department was explained to the patient and her daughter and the policy of not giving narcotics for chronic pain management was also discussed.  The patient then sat straight up in bed, put her finger in my face and yelled "I CAN'T TAKE THIS ANYMORE!".  The daughter then began to yell at this writer also to get out of the room and to "get the head of the hospital" because she was not going to talk to me anymore.

## 2015-07-20 DIAGNOSIS — N390 Urinary tract infection, site not specified: Secondary | ICD-10-CM | POA: Insufficient documentation

## 2015-07-20 LAB — URINE CULTURE: Culture: >=100000

## 2015-07-21 ENCOUNTER — Telehealth (HOSPITAL_COMMUNITY): Payer: Self-pay

## 2015-07-21 DIAGNOSIS — M150 Primary generalized (osteo)arthritis: Secondary | ICD-10-CM | POA: Diagnosis not present

## 2015-07-21 NOTE — Telephone Encounter (Signed)
Post ED Visit - Positive Culture Follow-up  Culture report reviewed by antimicrobial stewardship pharmacist:  []  Elenor Quinones, Pharm.D. []  Heide Guile, Pharm.D., BCPS []  Parks Neptune, Pharm.D. [x]  Alycia Rossetti, Pharm.D., BCPS []  [place], Pharm.D., BCPS, AAHIVP []  Legrand Como, Pharm.D., BCPS, AAHIVP []  Cassie Stewart, Pharm.D. []  Stephens November, Pharm.D.  Positive urine culture, >/= 100,000 colonies -> E Coli Treated with Cephalexin, organism sensitive to the same and no further patient follow-up is required at this time.  Dortha Kern 07/21/2015, 8:41 PM

## 2015-07-22 DIAGNOSIS — M150 Primary generalized (osteo)arthritis: Secondary | ICD-10-CM | POA: Diagnosis not present

## 2015-07-23 DIAGNOSIS — G8929 Other chronic pain: Secondary | ICD-10-CM | POA: Diagnosis not present

## 2015-07-23 DIAGNOSIS — R1084 Generalized abdominal pain: Secondary | ICD-10-CM | POA: Diagnosis not present

## 2015-07-23 DIAGNOSIS — M150 Primary generalized (osteo)arthritis: Secondary | ICD-10-CM | POA: Diagnosis not present

## 2015-07-24 DIAGNOSIS — M150 Primary generalized (osteo)arthritis: Secondary | ICD-10-CM | POA: Diagnosis not present

## 2015-07-25 ENCOUNTER — Ambulatory Visit (INDEPENDENT_AMBULATORY_CARE_PROVIDER_SITE_OTHER): Payer: Medicare Other | Admitting: Pharmacist Clinician (PhC)/ Clinical Pharmacy Specialist

## 2015-07-25 DIAGNOSIS — E1122 Type 2 diabetes mellitus with diabetic chronic kidney disease: Secondary | ICD-10-CM | POA: Diagnosis not present

## 2015-07-25 DIAGNOSIS — Z5181 Encounter for therapeutic drug level monitoring: Secondary | ICD-10-CM | POA: Diagnosis not present

## 2015-07-25 DIAGNOSIS — M150 Primary generalized (osteo)arthritis: Secondary | ICD-10-CM | POA: Diagnosis not present

## 2015-07-25 DIAGNOSIS — I130 Hypertensive heart and chronic kidney disease with heart failure and stage 1 through stage 4 chronic kidney disease, or unspecified chronic kidney disease: Secondary | ICD-10-CM | POA: Diagnosis not present

## 2015-07-25 DIAGNOSIS — I6350 Cerebral infarction due to unspecified occlusion or stenosis of unspecified cerebral artery: Secondary | ICD-10-CM

## 2015-07-25 DIAGNOSIS — I5032 Chronic diastolic (congestive) heart failure: Secondary | ICD-10-CM | POA: Diagnosis not present

## 2015-07-25 LAB — POCT INR: INR: 1.2

## 2015-07-26 DIAGNOSIS — M150 Primary generalized (osteo)arthritis: Secondary | ICD-10-CM | POA: Diagnosis not present

## 2015-07-27 DIAGNOSIS — M150 Primary generalized (osteo)arthritis: Secondary | ICD-10-CM | POA: Diagnosis not present

## 2015-07-28 ENCOUNTER — Ambulatory Visit (INDEPENDENT_AMBULATORY_CARE_PROVIDER_SITE_OTHER): Payer: Medicare Other

## 2015-07-28 ENCOUNTER — Telehealth: Payer: Self-pay

## 2015-07-28 DIAGNOSIS — Z5181 Encounter for therapeutic drug level monitoring: Secondary | ICD-10-CM | POA: Diagnosis not present

## 2015-07-28 DIAGNOSIS — I6350 Cerebral infarction due to unspecified occlusion or stenosis of unspecified cerebral artery: Secondary | ICD-10-CM | POA: Diagnosis not present

## 2015-07-28 DIAGNOSIS — I130 Hypertensive heart and chronic kidney disease with heart failure and stage 1 through stage 4 chronic kidney disease, or unspecified chronic kidney disease: Secondary | ICD-10-CM | POA: Diagnosis not present

## 2015-07-28 DIAGNOSIS — E1122 Type 2 diabetes mellitus with diabetic chronic kidney disease: Secondary | ICD-10-CM | POA: Diagnosis not present

## 2015-07-28 DIAGNOSIS — M150 Primary generalized (osteo)arthritis: Secondary | ICD-10-CM | POA: Diagnosis not present

## 2015-07-28 DIAGNOSIS — I5032 Chronic diastolic (congestive) heart failure: Secondary | ICD-10-CM | POA: Diagnosis not present

## 2015-07-28 LAB — POCT INR: INR: 1.6

## 2015-07-28 MED ORDER — MIRTAZAPINE 30 MG PO TABS: 30.0000 mg | ORAL_TABLET | Freq: Every day | ORAL | Status: DC

## 2015-07-28 MED ORDER — QUETIAPINE FUMARATE 25 MG PO TABS: 25.0000 mg | ORAL_TABLET | Freq: Every day | ORAL | Status: DC

## 2015-07-28 NOTE — Telephone Encounter (Signed)
There isn't anything we specifically do from our end.  Once they find a NH that they like, they apply and need to be evaluated by them.  The problem is that there could be a waiting list.

## 2015-07-28 NOTE — Telephone Encounter (Signed)
-----   Message from Pieter Partridge, DO sent at 07/28/2015  7:14 AM EST ----- I received a note from gastroenterology at [facility] stating that they feel the Remeron is ineffective for her depression.  I started her only on 15mg  daily.  We can increase dose to 30mg  daily.

## 2015-07-28 NOTE — Telephone Encounter (Signed)
It sounds like behavioral symptoms due to dementia and depression.  For agitation, we can start seroquel 12.5mg  at bedtime (pharmacy would have to cut the tablets.  If they cannot do that, then we can prescribe 25mg  at bedtime.  If the daughter feels that she needs to come in sooner to discuss, then absolutely

## 2015-07-28 NOTE — Telephone Encounter (Signed)
Spoke with daughter. States that medication is not helping. New Rx submitted to pharmacy. While on the phone daughter did mention that pt is declining. She is having incontience, agitation, etc. Should she come in sooner than 02/11/16? Please advise.

## 2015-07-28 NOTE — Telephone Encounter (Signed)
Spoke w/ Pharmacy, they no longer score pills. 25 mg daily seroquel sent in. Daughter states that she thinks it may be time to consider a facility. Would like to know if you help with that? Would like to get mother into [facility]. Please advise.

## 2015-07-28 NOTE — Telephone Encounter (Signed)
Left message on machine for pt to return call to the office.  

## 2015-07-29 DIAGNOSIS — M150 Primary generalized (osteo)arthritis: Secondary | ICD-10-CM | POA: Diagnosis not present

## 2015-07-30 ENCOUNTER — Ambulatory Visit (INDEPENDENT_AMBULATORY_CARE_PROVIDER_SITE_OTHER): Payer: Medicare Other | Admitting: Pharmacist Clinician (PhC)/ Clinical Pharmacy Specialist

## 2015-07-30 DIAGNOSIS — E1122 Type 2 diabetes mellitus with diabetic chronic kidney disease: Secondary | ICD-10-CM | POA: Diagnosis not present

## 2015-07-30 DIAGNOSIS — I130 Hypertensive heart and chronic kidney disease with heart failure and stage 1 through stage 4 chronic kidney disease, or unspecified chronic kidney disease: Secondary | ICD-10-CM | POA: Diagnosis not present

## 2015-07-30 DIAGNOSIS — I6350 Cerebral infarction due to unspecified occlusion or stenosis of unspecified cerebral artery: Secondary | ICD-10-CM

## 2015-07-30 DIAGNOSIS — M150 Primary generalized (osteo)arthritis: Secondary | ICD-10-CM | POA: Diagnosis not present

## 2015-07-30 DIAGNOSIS — Z5181 Encounter for therapeutic drug level monitoring: Secondary | ICD-10-CM | POA: Diagnosis not present

## 2015-07-30 DIAGNOSIS — I5032 Chronic diastolic (congestive) heart failure: Secondary | ICD-10-CM | POA: Diagnosis not present

## 2015-07-30 LAB — POCT INR: INR: 1.9

## 2015-07-31 ENCOUNTER — Encounter: Payer: Self-pay | Admitting: Endocrinology

## 2015-07-31 ENCOUNTER — Ambulatory Visit (INDEPENDENT_AMBULATORY_CARE_PROVIDER_SITE_OTHER): Payer: Medicare Other | Admitting: Endocrinology

## 2015-07-31 VITALS — BP 110/68 | HR 78 | Temp 98.5°F | Wt 145.0 lb

## 2015-07-31 DIAGNOSIS — M1000 Idiopathic gout, unspecified site: Secondary | ICD-10-CM | POA: Diagnosis not present

## 2015-07-31 DIAGNOSIS — Z992 Dependence on renal dialysis: Secondary | ICD-10-CM

## 2015-07-31 DIAGNOSIS — E1122 Type 2 diabetes mellitus with diabetic chronic kidney disease: Secondary | ICD-10-CM

## 2015-07-31 DIAGNOSIS — M150 Primary generalized (osteo)arthritis: Secondary | ICD-10-CM | POA: Diagnosis not present

## 2015-07-31 DIAGNOSIS — Z0000 Encounter for general adult medical examination without abnormal findings: Secondary | ICD-10-CM

## 2015-07-31 DIAGNOSIS — R109 Unspecified abdominal pain: Secondary | ICD-10-CM

## 2015-07-31 DIAGNOSIS — N184 Chronic kidney disease, stage 4 (severe): Secondary | ICD-10-CM

## 2015-07-31 DIAGNOSIS — F329 Major depressive disorder, single episode, unspecified: Secondary | ICD-10-CM

## 2015-07-31 DIAGNOSIS — F32A Depression, unspecified: Secondary | ICD-10-CM

## 2015-07-31 DIAGNOSIS — N186 End stage renal disease: Secondary | ICD-10-CM

## 2015-07-31 LAB — LIPID PANEL
Cholesterol: 155 mg/dL (ref 0–200)
HDL: 80.4 mg/dL (ref >39.00)
LDL Cholesterol: 54 mg/dL (ref 0–99)
NonHDL: 74.38
Total CHOL/HDL Ratio: 2
Triglycerides: 101 mg/dL (ref 0.0–149.0)
VLDL: 20.2 mg/dL (ref 0.0–40.0)

## 2015-07-31 LAB — HEMOGLOBIN A1C: Hgb A1c MFr Bld: 5.6 % (ref 4.6–6.5)

## 2015-07-31 LAB — TSH: TSH: 1.33 u[IU]/mL (ref 0.35–4.50)

## 2015-07-31 NOTE — Patient Instructions (Addendum)
please consider these measures for your health:  minimize alcohol.  do not use tobacco products.  have a colonoscopy at least every 10 years from age 55.  Women should have an annual mammogram from age 34.  keep firearms safely stored.  always use seat belts.  have working smoke alarms in your home.  see an eye doctor and dentist regularly.  never drive under the influence of alcohol or drugs (including prescription drugs).   it is critically important to prevent falling down (keep floor areas well-lit, dry, and free of loose objects.  If you have a cane, walker, or wheelchair, you should use it, even for short trips around the house.  Also, try not to rush). blood tests are requested for you today.  We'll let you know about the results. Please come back for a follow-up appointment in 6 months.

## 2015-07-31 NOTE — Progress Notes (Signed)
we discussed code status.  pt requests full code. 

## 2015-07-31 NOTE — Progress Notes (Signed)
Subjective:    Patient ID: Connie Ruiz, female    DOB: May 21, 1961, 55 y.o.   MRN: [id number]  HPI Pt is here for regular wellness examination, and is feeling pretty well in general, and says chronic med probs are stable, except as noted below. Past Medical History  Diagnosis Date  . THYROID NODULE, LEFT 04/10/2009  . HYPERLIPIDEMIA 08/21/2007  . GOUT 08/21/2007  . HYPERTENSION 08/21/2007    Dr. Andree Elk, [place] 08/21/2007  . CEREBROVASCULAR ACCIDENT, ACUTE 04/15/2010  . GERD 08/21/2007  . RENAL INSUFFICIENCY 08/21/2007  . LUPUS 08/21/2007  . OSTEOPOROSIS 08/21/2007    Rheumatol at [facility]  . DVT, HX OF 08/21/2007  . CLOSTRIDIUM DIFFICILE COLITIS, HX OF 08/21/2007  . KIDNEY TRANSPLANTATION, HX OF 08/22/2007    s/p renal transplant-Dr. Andree Elk, [facility]  . Pulmonary embolism ([place]) 07/16/2010  . Renal failure   . Current use of long term anticoagulation     Dr. Andree Elk, [facility]  . Depression     Dr. Andree Elk, [facility]  . History of stroke with residual effects   . Right sided weakness   . CVA 04/17/2010  . Tachycardia   . DIABETES MELLITUS, TYPE II 08/21/2007  . Candida esophagitis ([place]) 11/12/2014  . Steroid-induced hyperglycemia 11/09/2014    Past Surgical History  Procedure Laterality Date  . Cholecystectomy    . Tubal ligation    . Kidney transplant Right 2009  . Renal biopsy, open  1981  . Enteroscopy N/A 11/11/2014    Procedure: ENTEROSCOPY;  Surgeon: Ladene Artist, MD;  Location: [facility];  Service: Endoscopy;  Laterality: N/A;    Social History   Social History  . Marital Status: Divorced    Spouse Name: N/A  . Number of Children: 1  . Years of Education: N/A   Occupational History  . DISABILITY    Social History Main Topics  . Smoking status: Never Smoker   . Smokeless tobacco: Never Used  . Alcohol Use: No  . Drug Use: No  . Sexual Activity: No   Other Topics Concern  . Not on file   Social History Narrative   Retired   Regular  exercise-no   Pt completed hs.     Current Outpatient Prescriptions on File Prior to Visit  Medication Sig Dispense Refill  . alendronate (FOSAMAX) 70 MG tablet Take 70 mg by mouth once a week. Take on Saturdays with a full glass of water on an empty stomach.    Marland Kitchen ammonium lactate (AMLACTIN) 12 % cream Apply 1 g topically 2 (two) times daily as needed for dry skin.     Marland Kitchen atorvastatin (LIPITOR) 40 MG tablet Take 40 mg by mouth daily.    . Blood Glucose Monitoring Suppl (ONETOUCH VERIO IQ SYSTEM) W/DEVICE KIT Use to check blood sugar 1 time per day 1 kit 0  . calcitRIOL (ROCALTROL) 0.25 MCG capsule TAKE ONE CAPSULE BY MOUTH DAILY 30 capsule 0  . diltiazem (TIAZAC) 240 MG 24 hr capsule Take 1 capsule (240 mg total) by mouth daily. 30 capsule 11  . donepezil (ARICEPT) 10 MG tablet Take 1 tablet (10 mg total) by mouth at bedtime. 30 tablet 8  . esomeprazole (NEXIUM) 20 MG capsule Take 20 mg by mouth daily.    Marland Kitchen glucose blood (ONE TOUCH ULTRA TEST) test strip Use to check blood sugar 1 time per day 100 each 2  . HYDROcodone-acetaminophen (NORCO/VICODIN) 5-325 MG tablet Take 1-2 tablets by mouth every 4 (  four) hours as needed for moderate pain. 15 tablet 0  . losartan (COZAAR) 100 MG tablet Take 1 tablet (100 mg total) by mouth daily. 30 tablet 3  . mirtazapine (REMERON) 30 MG tablet Take 1 tablet (30 mg total) by mouth at bedtime. 30 tablet 1  . mycophenolate (CELLCEPT) 250 MG capsule Take 250 mg by mouth 2 (two) times daily.    Marland Kitchen nystatin (MYCOSTATIN) powder Apply 1 g topically 4 (four) times daily as needed. For yeast under breast    . ondansetron (ZOFRAN ODT) 4 MG disintegrating tablet 73m ODT q4 hours prn nausea/vomiting 15 tablet 0  . ONETOUCH DELICA LANCETS 345WMISC Use to check blood sugar 1 time per day 100 each 2  . oxybutynin (DITROPAN) 5 MG tablet Take 5 mg by mouth daily as needed for bladder spasms.     .Marland KitchenQUEtiapine (SEROQUEL) 25 MG tablet Take 1 tablet (25 mg total) by mouth at  bedtime. 30 tablet 1  . sertraline (ZOLOFT) 100 MG tablet Take 50 mg by mouth daily.    . tacrolimus (PROGRAF) 1 MG capsule Take 1 mg by mouth 2 (two) times daily.     . traMADol (ULTRAM) 50 MG tablet Take 1 tablet (50 mg total) by mouth every 8 (eight) hours as needed. 30 tablet 0  . triamcinolone (KENALOG) 0.025 % cream Apply 1 application topically daily as needed (dry skin).     .Marland Kitchenwarfarin (COUMADIN) 2.5 MG tablet Take 2.5 mg by mouth daily.     .Marland Kitchenwarfarin (COUMADIN) 5 MG tablet Take 1 tablet (5 mg total) by mouth daily. (Patient taking differently: Take 5 mg by mouth every Friday. ) 40 tablet 1   No current facility-administered medications on file prior to visit.    Allergies  Allergen Reactions  . Oxycodone-Acetaminophen Shortness Of Breath and Nausea Only  . Propoxyphene N-Acetaminophen Shortness Of Breath and Nausea Only  . Sulfonamide Derivatives Shortness Of Breath and Nausea Only  . Adhesive [Tape]   . Codeine Nausea Only  . Other Other (See Comments)    No blood, Jehovaeh Witness   . Gabapentin Anxiety    twitching  . Latex Rash  . Metoprolol Rash  . Morphine And Related Rash    IV site on arm is red, patient reports this is improving.  NO shortness of breath reported.    Family History  Problem Relation Age of Onset  . Heart attack Mother   . Heart disease Father   . Asthma Sister   . Asthma Sister   . Asthma Daughter   . Cancer Maternal Grandfather     prostate  . Cancer Paternal Grandfather     colon    BP 110/68 mmHg  Pulse 78  Temp(Src) 98.5 F (36.9 C) (Oral)  Wt 145 lb (65.772 kg)  SpO2 94%   Review of Systems  Constitutional: Negative for fever.  HENT: Negative for hearing loss.   Eyes: Negative for visual disturbance.  Respiratory: Negative for shortness of breath.   Cardiovascular: Negative for chest pain.  Gastrointestinal: Negative for anal bleeding.  Endocrine: Positive for cold intolerance.  Genitourinary: Negative for hematuria.    Musculoskeletal: Negative for back pain.  Skin: Negative for rash.  Allergic/Immunologic: Negative for environmental allergies.  Neurological: Negative for syncope and headaches.  Hematological: Bruises/bleeds easily.  Psychiatric/Behavioral:       Sees psych for depression       Objective:   Physical Exam VS: see vs page GEN: no distress  HEAD: head: no deformity eyes: no periorbital swelling, but there is proptosis external nose and ears are normal mouth: no lesion seen NECK: supple, thyroid is not enlarged.  2 cm freely mobile thyroid nodule.  CHEST WALL: no deformity.   LUNGS:  Clear to auscultation.   BREASTS: sees gyn.   CV: reg rate and rhythm, no murmur ABD: abdomen is soft.  Diffuse guarding.  no hepatosplenomegaly.  not distended.  no hernia.  Old healed surgical scar. GENITALIA/RECTAL: sees gyn MUSCULOSKELETAL: muscle bulk and strength are grossly normal.  no obvious joint swelling.  gait is slow but steady with a cane EXTEMITIES: no deformity.  no ulcer on the feet.  feet are of normal color and temp.  Trace bilat leg edema  Healed HD access at the right upper arm.  There is bilateral onychomycosis of the toenails.  PULSES: dorsalis pedis intact bilat.  no carotid bruit NEURO:  cn 2-12 grossly intact.   readily moves all 4's.  sensation is intact to touch on the feet.   SKIN:  Normal texture and temperature.  No rash or suspicious lesion is visible.  Patchy hyperpigmentation is noted on the feet.  NODES:  None palpable at the neck PSYCH: alert, well-oriented.  Does not appear anxious nor depressed.     Assessment & Plan:  Wellness visit today, with problems stable, except as noted.  Patient is advised the following: Patient Instructions  please consider these measures for your health:  minimize alcohol.  do not use tobacco products.  have a colonoscopy at least every 10 years from age 19.  Women should have an annual mammogram from age 24.  keep firearms safely  stored.  always use seat belts.  have working smoke alarms in your home.  see an eye doctor and dentist regularly.  never drive under the influence of alcohol or drugs (including prescription drugs).   it is critically important to prevent falling down (keep floor areas well-lit, dry, and free of loose objects.  If you have a cane, walker, or wheelchair, you should use it, even for short trips around the house.  Also, try not to rush). blood tests are requested for you today.  We'll let you know about the results. Please come back for a follow-up appointment in 6 months.

## 2015-07-31 NOTE — Progress Notes (Signed)
Pre visit review using our clinic review tool, if applicable. No additional management support is needed unless otherwise documented below in the visit note. 

## 2015-08-01 ENCOUNTER — Ambulatory Visit (INDEPENDENT_AMBULATORY_CARE_PROVIDER_SITE_OTHER): Payer: Medicare Other | Admitting: Cardiovascular Disease

## 2015-08-01 DIAGNOSIS — M150 Primary generalized (osteo)arthritis: Secondary | ICD-10-CM | POA: Diagnosis not present

## 2015-08-01 DIAGNOSIS — I5032 Chronic diastolic (congestive) heart failure: Secondary | ICD-10-CM | POA: Diagnosis not present

## 2015-08-01 DIAGNOSIS — E1122 Type 2 diabetes mellitus with diabetic chronic kidney disease: Secondary | ICD-10-CM | POA: Diagnosis not present

## 2015-08-01 DIAGNOSIS — I6350 Cerebral infarction due to unspecified occlusion or stenosis of unspecified cerebral artery: Secondary | ICD-10-CM

## 2015-08-01 DIAGNOSIS — I130 Hypertensive heart and chronic kidney disease with heart failure and stage 1 through stage 4 chronic kidney disease, or unspecified chronic kidney disease: Secondary | ICD-10-CM | POA: Diagnosis not present

## 2015-08-01 DIAGNOSIS — Z5181 Encounter for therapeutic drug level monitoring: Secondary | ICD-10-CM | POA: Diagnosis not present

## 2015-08-01 LAB — POCT INR: INR: 2.1

## 2015-08-02 DIAGNOSIS — M150 Primary generalized (osteo)arthritis: Secondary | ICD-10-CM | POA: Diagnosis not present

## 2015-08-03 DIAGNOSIS — M150 Primary generalized (osteo)arthritis: Secondary | ICD-10-CM | POA: Diagnosis not present

## 2015-08-03 DIAGNOSIS — E119 Type 2 diabetes mellitus without complications: Secondary | ICD-10-CM | POA: Insufficient documentation

## 2015-08-06 ENCOUNTER — Ambulatory Visit (INDEPENDENT_AMBULATORY_CARE_PROVIDER_SITE_OTHER): Payer: Medicare Other | Admitting: Cardiovascular Disease

## 2015-08-06 DIAGNOSIS — I5032 Chronic diastolic (congestive) heart failure: Secondary | ICD-10-CM | POA: Diagnosis not present

## 2015-08-06 DIAGNOSIS — I6350 Cerebral infarction due to unspecified occlusion or stenosis of unspecified cerebral artery: Secondary | ICD-10-CM

## 2015-08-06 DIAGNOSIS — E1122 Type 2 diabetes mellitus with diabetic chronic kidney disease: Secondary | ICD-10-CM | POA: Diagnosis not present

## 2015-08-06 DIAGNOSIS — Z5181 Encounter for therapeutic drug level monitoring: Secondary | ICD-10-CM | POA: Diagnosis not present

## 2015-08-06 DIAGNOSIS — I130 Hypertensive heart and chronic kidney disease with heart failure and stage 1 through stage 4 chronic kidney disease, or unspecified chronic kidney disease: Secondary | ICD-10-CM | POA: Diagnosis not present

## 2015-08-06 LAB — POCT INR: INR: 1.2

## 2015-08-11 DIAGNOSIS — M150 Primary generalized (osteo)arthritis: Secondary | ICD-10-CM | POA: Diagnosis not present

## 2015-08-12 DIAGNOSIS — M150 Primary generalized (osteo)arthritis: Secondary | ICD-10-CM | POA: Diagnosis not present

## 2015-08-13 ENCOUNTER — Ambulatory Visit (INDEPENDENT_AMBULATORY_CARE_PROVIDER_SITE_OTHER): Payer: Medicare Other | Admitting: Cardiology

## 2015-08-13 DIAGNOSIS — Z5181 Encounter for therapeutic drug level monitoring: Secondary | ICD-10-CM | POA: Diagnosis not present

## 2015-08-13 DIAGNOSIS — I130 Hypertensive heart and chronic kidney disease with heart failure and stage 1 through stage 4 chronic kidney disease, or unspecified chronic kidney disease: Secondary | ICD-10-CM | POA: Diagnosis not present

## 2015-08-13 DIAGNOSIS — E1122 Type 2 diabetes mellitus with diabetic chronic kidney disease: Secondary | ICD-10-CM | POA: Diagnosis not present

## 2015-08-13 DIAGNOSIS — M150 Primary generalized (osteo)arthritis: Secondary | ICD-10-CM | POA: Diagnosis not present

## 2015-08-13 DIAGNOSIS — I6350 Cerebral infarction due to unspecified occlusion or stenosis of unspecified cerebral artery: Secondary | ICD-10-CM

## 2015-08-13 DIAGNOSIS — I5032 Chronic diastolic (congestive) heart failure: Secondary | ICD-10-CM | POA: Diagnosis not present

## 2015-08-13 LAB — POCT INR: INR: 1.3

## 2015-08-14 DIAGNOSIS — M150 Primary generalized (osteo)arthritis: Secondary | ICD-10-CM | POA: Diagnosis not present

## 2015-08-15 ENCOUNTER — Other Ambulatory Visit: Payer: Self-pay

## 2015-08-15 DIAGNOSIS — M150 Primary generalized (osteo)arthritis: Secondary | ICD-10-CM | POA: Diagnosis not present

## 2015-08-15 NOTE — Patient Outreach (Signed)
[place] [facility]) Care Management  08/15/2015  Connie Ruiz [birth year] [id number]   Telephone call to patient for [facility] referral.  Spoke with daughter Celsa Frogge who is patient's responsible party.  She states she is unable to talk at this time but advised she would call back.    Plan: RN Health Coach will wait for return call otherwise will attempt again in 1-2 weeks.  Jone Baseman, RN, MSN [place] [phone]

## 2015-08-17 DIAGNOSIS — M150 Primary generalized (osteo)arthritis: Secondary | ICD-10-CM | POA: Diagnosis not present

## 2015-08-20 ENCOUNTER — Other Ambulatory Visit: Payer: Self-pay

## 2015-08-20 NOTE — Patient Outreach (Signed)
[place] [facility]) Care Management  08/20/2015  ITZURI BOUDRIE 1961-04-05 [id number]   Telephone call for high risk referral.  Spoke with daughter Keyshla Lavalle who is the responsible party.  She states this is not a good time.  Daughter to call health coach another time.    Plan: RN Health Coach will wait for return call from daughter, if no return call will outreach again in 1-2 weeks.  Jone Baseman, RN, MSN [place] [phone]

## 2015-08-21 ENCOUNTER — Telehealth: Payer: Self-pay | Admitting: Cardiovascular Disease

## 2015-08-21 DIAGNOSIS — Z5181 Encounter for therapeutic drug level monitoring: Secondary | ICD-10-CM | POA: Diagnosis not present

## 2015-08-21 DIAGNOSIS — M329 Systemic lupus erythematosus, unspecified: Secondary | ICD-10-CM | POA: Diagnosis not present

## 2015-08-21 DIAGNOSIS — Z7982 Long term (current) use of aspirin: Secondary | ICD-10-CM | POA: Diagnosis not present

## 2015-08-21 DIAGNOSIS — Z86718 Personal history of other venous thrombosis and embolism: Secondary | ICD-10-CM | POA: Diagnosis not present

## 2015-08-21 DIAGNOSIS — E1122 Type 2 diabetes mellitus with diabetic chronic kidney disease: Secondary | ICD-10-CM | POA: Diagnosis not present

## 2015-08-21 DIAGNOSIS — I69351 Hemiplegia and hemiparesis following cerebral infarction affecting right dominant side: Secondary | ICD-10-CM | POA: Diagnosis not present

## 2015-08-21 DIAGNOSIS — Z79891 Long term (current) use of opiate analgesic: Secondary | ICD-10-CM | POA: Diagnosis not present

## 2015-08-21 DIAGNOSIS — I5032 Chronic diastolic (congestive) heart failure: Secondary | ICD-10-CM | POA: Diagnosis not present

## 2015-08-21 DIAGNOSIS — I130 Hypertensive heart and chronic kidney disease with heart failure and stage 1 through stage 4 chronic kidney disease, or unspecified chronic kidney disease: Secondary | ICD-10-CM | POA: Diagnosis not present

## 2015-08-21 DIAGNOSIS — N189 Chronic kidney disease, unspecified: Secondary | ICD-10-CM | POA: Diagnosis not present

## 2015-08-21 DIAGNOSIS — Z7952 Long term (current) use of systemic steroids: Secondary | ICD-10-CM | POA: Diagnosis not present

## 2015-08-21 DIAGNOSIS — Z7901 Long term (current) use of anticoagulants: Secondary | ICD-10-CM | POA: Diagnosis not present

## 2015-08-21 NOTE — Telephone Encounter (Signed)
Spoke to home health RN  INR 1.8 PT 21.9   ANGELA RN AWARE KRISTIN ( PHARMACIST) WILL CONTACT PATIENT AND GENITIVA  HOME CARE WITH NEW INSTRUCTIONS

## 2015-08-21 NOTE — Telephone Encounter (Signed)
Looks like you have been following her at [facility].

## 2015-08-22 ENCOUNTER — Ambulatory Visit (INDEPENDENT_AMBULATORY_CARE_PROVIDER_SITE_OTHER): Payer: Medicare Other

## 2015-08-22 DIAGNOSIS — I6350 Cerebral infarction due to unspecified occlusion or stenosis of unspecified cerebral artery: Secondary | ICD-10-CM

## 2015-08-22 LAB — POCT INR: INR: 1.8

## 2015-08-22 NOTE — Telephone Encounter (Signed)
Results noted see anticoagulation note in Epic.

## 2015-08-27 ENCOUNTER — Other Ambulatory Visit: Payer: Self-pay

## 2015-08-27 ENCOUNTER — Ambulatory Visit: Payer: Medicare Other | Admitting: Podiatry

## 2015-08-27 NOTE — Patient Outreach (Signed)
[place] [facility]) Care Management  08/27/2015  Connie Ruiz [birth year] [id number]  Telephone call to daughter Jamileth Feulner for referral for high risk CHF.  She reports that patient has done good over the last few months and that she has an aide that comes 4 hours per day. She states that patient is unable really communicate via phone and she takes calls and handles patient medical needs but she works daily.  She states with the aide coming things are better so that she can work.  She thinks she does not need outreach for education but does ask about possible resources for bubble pack medications and getting ensure and incontinent supplies.  Advised daughter I would provided with resources for bubble packs.  Daughter to find case worker contact information to get information on ensure and incontinent supplies.    Services: Daughter has declined care management services at this time as patient in doing better and her work schedule but interested in getting resources for pill packs for medications.    Plan: RN Health Coach will get resources for pill packs and call daughter back.   Jone Baseman, RN, MSN [place] [phone]

## 2015-08-28 ENCOUNTER — Ambulatory Visit (INDEPENDENT_AMBULATORY_CARE_PROVIDER_SITE_OTHER): Payer: Medicare Other | Admitting: Podiatry

## 2015-08-28 ENCOUNTER — Encounter: Payer: Self-pay | Admitting: Podiatry

## 2015-08-28 VITALS — BP 124/96 | HR 98 | Resp 14

## 2015-08-28 DIAGNOSIS — Z7952 Long term (current) use of systemic steroids: Secondary | ICD-10-CM | POA: Diagnosis not present

## 2015-08-28 DIAGNOSIS — B351 Tinea unguium: Secondary | ICD-10-CM

## 2015-08-28 DIAGNOSIS — I130 Hypertensive heart and chronic kidney disease with heart failure and stage 1 through stage 4 chronic kidney disease, or unspecified chronic kidney disease: Secondary | ICD-10-CM | POA: Diagnosis not present

## 2015-08-28 DIAGNOSIS — E1122 Type 2 diabetes mellitus with diabetic chronic kidney disease: Secondary | ICD-10-CM | POA: Diagnosis not present

## 2015-08-28 DIAGNOSIS — I5032 Chronic diastolic (congestive) heart failure: Secondary | ICD-10-CM | POA: Diagnosis not present

## 2015-08-28 DIAGNOSIS — M329 Systemic lupus erythematosus, unspecified: Secondary | ICD-10-CM | POA: Diagnosis not present

## 2015-08-28 DIAGNOSIS — Z7901 Long term (current) use of anticoagulants: Secondary | ICD-10-CM | POA: Diagnosis not present

## 2015-08-28 DIAGNOSIS — M79673 Pain in unspecified foot: Secondary | ICD-10-CM | POA: Diagnosis not present

## 2015-08-28 DIAGNOSIS — Z7982 Long term (current) use of aspirin: Secondary | ICD-10-CM | POA: Diagnosis not present

## 2015-08-28 DIAGNOSIS — Z5181 Encounter for therapeutic drug level monitoring: Secondary | ICD-10-CM | POA: Diagnosis not present

## 2015-08-28 DIAGNOSIS — Z79891 Long term (current) use of opiate analgesic: Secondary | ICD-10-CM | POA: Diagnosis not present

## 2015-08-28 DIAGNOSIS — N189 Chronic kidney disease, unspecified: Secondary | ICD-10-CM | POA: Diagnosis not present

## 2015-08-28 DIAGNOSIS — I69351 Hemiplegia and hemiparesis following cerebral infarction affecting right dominant side: Secondary | ICD-10-CM | POA: Diagnosis not present

## 2015-08-28 DIAGNOSIS — E1159 Type 2 diabetes mellitus with other circulatory complications: Secondary | ICD-10-CM | POA: Diagnosis not present

## 2015-08-28 DIAGNOSIS — M79609 Pain in unspecified limb: Principal | ICD-10-CM

## 2015-08-28 DIAGNOSIS — Z86718 Personal history of other venous thrombosis and embolism: Secondary | ICD-10-CM | POA: Diagnosis not present

## 2015-08-28 NOTE — Progress Notes (Signed)
   Subjective:    Patient ID: Connie Ruiz, female    DOB: [birth year], 55 y.o.   MRN: [id number]  HPI this patient presents the office with chief complaint of long thick painful nails. Patient has medical history of lupus, stroke as well as diabetes. Patient states that she has pain walking and wearing her shoes. She presents the office today for preventative foot care services    Review of Systems  All other systems reviewed and are negative.      Objective:   Physical Exam GENERAL APPEARANCE: Alert, conversant. Appropriately groomed. No acute distress.  VASCULAR: Pedal pulses are  Not  palpable at  [facility] and PT bilateral.  Capillary refill time is immediate to all digits,  Normal temperature gradient.    NEUROLOGIC: sensation is normal to 5.07 monofilament at 5/5 sites bilateral.  Light touch is intact bilateral, Muscle strength normal.  MUSCULOSKELETAL: acceptable muscle strength, tone and stability bilateral.  Intrinsic muscluature intact bilateral.  Rectus appearance of foot and digits noted bilateral. Hallux varus B/L  DERMATOLOGIC: skin color, texture, and turgor are within normal limits.  No preulcerative lesions or ulcers  are seen, no interdigital maceration noted.  No open lesions present.  . No drainage noted.  NAILS  Thick disfigured discolored nails both feet.        Assessment & Plan:  Onychomycosis  DM with angiopathy  Debridement of Nails  RTC 3 months.Gardiner Barefoot DPM

## 2015-08-29 ENCOUNTER — Telehealth: Payer: Self-pay | Admitting: Pharmacist

## 2015-08-29 ENCOUNTER — Ambulatory Visit (INDEPENDENT_AMBULATORY_CARE_PROVIDER_SITE_OTHER): Payer: Medicare Other | Admitting: Cardiology

## 2015-08-29 DIAGNOSIS — I110 Hypertensive heart disease with heart failure: Secondary | ICD-10-CM | POA: Diagnosis not present

## 2015-08-29 DIAGNOSIS — I6350 Cerebral infarction due to unspecified occlusion or stenosis of unspecified cerebral artery: Secondary | ICD-10-CM

## 2015-08-29 DIAGNOSIS — D6861 Antiphospholipid syndrome: Secondary | ICD-10-CM | POA: Diagnosis not present

## 2015-08-29 DIAGNOSIS — E119 Type 2 diabetes mellitus without complications: Secondary | ICD-10-CM | POA: Diagnosis not present

## 2015-08-29 DIAGNOSIS — I5032 Chronic diastolic (congestive) heart failure: Secondary | ICD-10-CM | POA: Diagnosis not present

## 2015-08-29 LAB — POCT INR: INR: 1.7

## 2015-08-29 NOTE — Telephone Encounter (Signed)
New Message  RN from [place] calling to report following-   PT- 20.1  INR- 1.7

## 2015-08-29 NOTE — Telephone Encounter (Signed)
Nurse calls back she saw pt late yesterday around 6pm due to pt having a MD appt. Thus INR is from yesterday. See anticoagulation note.

## 2015-09-04 ENCOUNTER — Ambulatory Visit (INDEPENDENT_AMBULATORY_CARE_PROVIDER_SITE_OTHER): Payer: Medicare Other | Admitting: Cardiovascular Disease

## 2015-09-04 DIAGNOSIS — M3214 Glomerular disease in systemic lupus erythematosus: Secondary | ICD-10-CM | POA: Diagnosis not present

## 2015-09-04 DIAGNOSIS — I110 Hypertensive heart disease with heart failure: Secondary | ICD-10-CM | POA: Diagnosis not present

## 2015-09-04 DIAGNOSIS — Z79891 Long term (current) use of opiate analgesic: Secondary | ICD-10-CM | POA: Diagnosis not present

## 2015-09-04 DIAGNOSIS — I5032 Chronic diastolic (congestive) heart failure: Secondary | ICD-10-CM | POA: Diagnosis not present

## 2015-09-04 DIAGNOSIS — N189 Chronic kidney disease, unspecified: Secondary | ICD-10-CM | POA: Diagnosis not present

## 2015-09-04 DIAGNOSIS — I6350 Cerebral infarction due to unspecified occlusion or stenosis of unspecified cerebral artery: Secondary | ICD-10-CM

## 2015-09-04 DIAGNOSIS — E119 Type 2 diabetes mellitus without complications: Secondary | ICD-10-CM | POA: Diagnosis not present

## 2015-09-04 DIAGNOSIS — N2889 Other specified disorders of kidney and ureter: Secondary | ICD-10-CM | POA: Diagnosis not present

## 2015-09-04 DIAGNOSIS — D6861 Antiphospholipid syndrome: Secondary | ICD-10-CM | POA: Diagnosis not present

## 2015-09-04 DIAGNOSIS — Z7952 Long term (current) use of systemic steroids: Secondary | ICD-10-CM | POA: Diagnosis not present

## 2015-09-04 DIAGNOSIS — Z7901 Long term (current) use of anticoagulants: Secondary | ICD-10-CM | POA: Diagnosis not present

## 2015-09-04 DIAGNOSIS — Z7982 Long term (current) use of aspirin: Secondary | ICD-10-CM | POA: Diagnosis not present

## 2015-09-04 DIAGNOSIS — Z5181 Encounter for therapeutic drug level monitoring: Secondary | ICD-10-CM | POA: Diagnosis not present

## 2015-09-04 LAB — POCT INR: INR: 2.4

## 2015-09-08 ENCOUNTER — Other Ambulatory Visit: Payer: Self-pay

## 2015-09-08 DIAGNOSIS — Z4822 Encounter for aftercare following kidney transplant: Secondary | ICD-10-CM | POA: Diagnosis not present

## 2015-09-08 DIAGNOSIS — N186 End stage renal disease: Secondary | ICD-10-CM | POA: Diagnosis not present

## 2015-09-08 DIAGNOSIS — E785 Hyperlipidemia, unspecified: Secondary | ICD-10-CM | POA: Diagnosis not present

## 2015-09-08 DIAGNOSIS — M109 Gout, unspecified: Secondary | ICD-10-CM | POA: Diagnosis not present

## 2015-09-08 DIAGNOSIS — Z940 Kidney transplant status: Secondary | ICD-10-CM | POA: Diagnosis not present

## 2015-09-08 DIAGNOSIS — I421 Obstructive hypertrophic cardiomyopathy: Secondary | ICD-10-CM | POA: Diagnosis not present

## 2015-09-08 DIAGNOSIS — Z79899 Other long term (current) drug therapy: Secondary | ICD-10-CM | POA: Diagnosis not present

## 2015-09-08 DIAGNOSIS — Z7901 Long term (current) use of anticoagulants: Secondary | ICD-10-CM | POA: Diagnosis not present

## 2015-09-08 DIAGNOSIS — R197 Diarrhea, unspecified: Secondary | ICD-10-CM | POA: Diagnosis not present

## 2015-09-08 DIAGNOSIS — I120 Hypertensive chronic kidney disease with stage 5 chronic kidney disease or end stage renal disease: Secondary | ICD-10-CM | POA: Diagnosis not present

## 2015-09-08 DIAGNOSIS — M329 Systemic lupus erythematosus, unspecified: Secondary | ICD-10-CM | POA: Diagnosis not present

## 2015-09-08 NOTE — Patient Outreach (Signed)
[place] [facility]) Care Management  09/08/2015  Connie Ruiz [birth year] [id number]   Telephone call to  Daughter Connie Ruiz to inquire if they had received resources on medication pill packs. Daughter states that she had not checked but will check.  Advised her to call health coach if she had not received it. Advised that health coach will sign off at this time.  She verbalized understanding.  She advised that I also call patient to inquire if she received the letter with resources.  She gave patient telephone number.  Called [phone] compliant voice message left.    Plan: RN Health Coach will close case at this time.  Jone Baseman, RN, MSN [place] [phone]

## 2015-09-09 ENCOUNTER — Ambulatory Visit: Payer: Medicare Other | Admitting: Endocrinology

## 2015-09-11 DIAGNOSIS — Z5181 Encounter for therapeutic drug level monitoring: Secondary | ICD-10-CM | POA: Diagnosis not present

## 2015-09-11 DIAGNOSIS — E119 Type 2 diabetes mellitus without complications: Secondary | ICD-10-CM | POA: Diagnosis not present

## 2015-09-11 DIAGNOSIS — I110 Hypertensive heart disease with heart failure: Secondary | ICD-10-CM | POA: Diagnosis not present

## 2015-09-11 DIAGNOSIS — M3214 Glomerular disease in systemic lupus erythematosus: Secondary | ICD-10-CM | POA: Diagnosis not present

## 2015-09-11 DIAGNOSIS — Z7982 Long term (current) use of aspirin: Secondary | ICD-10-CM | POA: Diagnosis not present

## 2015-09-11 DIAGNOSIS — N189 Chronic kidney disease, unspecified: Secondary | ICD-10-CM | POA: Diagnosis not present

## 2015-09-11 DIAGNOSIS — N2889 Other specified disorders of kidney and ureter: Secondary | ICD-10-CM | POA: Diagnosis not present

## 2015-09-11 DIAGNOSIS — Z7952 Long term (current) use of systemic steroids: Secondary | ICD-10-CM | POA: Diagnosis not present

## 2015-09-11 DIAGNOSIS — D6861 Antiphospholipid syndrome: Secondary | ICD-10-CM | POA: Diagnosis not present

## 2015-09-11 DIAGNOSIS — Z79891 Long term (current) use of opiate analgesic: Secondary | ICD-10-CM | POA: Diagnosis not present

## 2015-09-11 DIAGNOSIS — I5032 Chronic diastolic (congestive) heart failure: Secondary | ICD-10-CM | POA: Diagnosis not present

## 2015-09-11 DIAGNOSIS — Z7901 Long term (current) use of anticoagulants: Secondary | ICD-10-CM | POA: Diagnosis not present

## 2015-09-17 ENCOUNTER — Other Ambulatory Visit: Payer: Self-pay

## 2015-09-17 MED ORDER — LOSARTAN POTASSIUM 100 MG PO TABS: 100.0000 mg | ORAL_TABLET | Freq: Every day | ORAL | Status: DC

## 2015-09-17 MED ORDER — ATORVASTATIN CALCIUM 40 MG PO TABS: 40.0000 mg | ORAL_TABLET | Freq: Every day | ORAL | Status: DC

## 2015-09-18 ENCOUNTER — Ambulatory Visit (INDEPENDENT_AMBULATORY_CARE_PROVIDER_SITE_OTHER): Payer: Medicare Other | Admitting: Internal Medicine

## 2015-09-18 DIAGNOSIS — N2889 Other specified disorders of kidney and ureter: Secondary | ICD-10-CM | POA: Diagnosis not present

## 2015-09-18 DIAGNOSIS — D6861 Antiphospholipid syndrome: Secondary | ICD-10-CM | POA: Diagnosis not present

## 2015-09-18 DIAGNOSIS — Z5181 Encounter for therapeutic drug level monitoring: Secondary | ICD-10-CM | POA: Diagnosis not present

## 2015-09-18 DIAGNOSIS — M3214 Glomerular disease in systemic lupus erythematosus: Secondary | ICD-10-CM | POA: Diagnosis not present

## 2015-09-18 DIAGNOSIS — Z7901 Long term (current) use of anticoagulants: Secondary | ICD-10-CM | POA: Diagnosis not present

## 2015-09-18 DIAGNOSIS — I110 Hypertensive heart disease with heart failure: Secondary | ICD-10-CM | POA: Diagnosis not present

## 2015-09-18 DIAGNOSIS — E119 Type 2 diabetes mellitus without complications: Secondary | ICD-10-CM | POA: Diagnosis not present

## 2015-09-18 DIAGNOSIS — Z7952 Long term (current) use of systemic steroids: Secondary | ICD-10-CM | POA: Diagnosis not present

## 2015-09-18 DIAGNOSIS — Z7982 Long term (current) use of aspirin: Secondary | ICD-10-CM | POA: Diagnosis not present

## 2015-09-18 DIAGNOSIS — N189 Chronic kidney disease, unspecified: Secondary | ICD-10-CM | POA: Diagnosis not present

## 2015-09-18 DIAGNOSIS — I6350 Cerebral infarction due to unspecified occlusion or stenosis of unspecified cerebral artery: Secondary | ICD-10-CM

## 2015-09-18 DIAGNOSIS — Z79891 Long term (current) use of opiate analgesic: Secondary | ICD-10-CM | POA: Diagnosis not present

## 2015-09-18 DIAGNOSIS — I5032 Chronic diastolic (congestive) heart failure: Secondary | ICD-10-CM | POA: Diagnosis not present

## 2015-09-18 LAB — POCT INR: INR: 1.8

## 2015-09-23 ENCOUNTER — Ambulatory Visit (INDEPENDENT_AMBULATORY_CARE_PROVIDER_SITE_OTHER): Payer: Medicare Other | Admitting: Endocrinology

## 2015-09-23 DIAGNOSIS — Z940 Kidney transplant status: Secondary | ICD-10-CM

## 2015-09-23 LAB — BASIC METABOLIC PANEL
BUN: 15 mg/dL (ref 6–23)
CO2: 24 mEq/L (ref 19–32)
Calcium: 9.5 mg/dL (ref 8.4–10.5)
Chloride: 107 mEq/L (ref 96–112)
Creatinine, Ser: 0.62 mg/dL (ref 0.40–1.20)
GFR: 128.54 mL/min (ref >60.00)
Glucose, Bld: 119 mg/dL — ABNORMAL HIGH (ref 70–99)
Potassium: 4 mEq/L (ref 3.5–5.1)
Sodium: 137 mEq/L (ref 135–145)

## 2015-09-23 LAB — MAGNESIUM: Magnesium: 1.7 mg/dL (ref 1.5–2.5)

## 2015-09-23 NOTE — Patient Instructions (Signed)
blood tests are requested for you today.  We'll let you know about the results. We'll send in the form.

## 2015-09-23 NOTE — Progress Notes (Signed)
Subjective:    Patient ID: Connie Ruiz, female    DOB: 05-20-1961, 55 y.o.   MRN: [id number]  HPI Pt reports many years of intermittent incont of urine.  No pain at the the urethra.  She has intermitt assoc bowel incont.  These sxs have been attributed to her CVA.  She wears incont undergarments.  Dr Kern Alberta requests Mg++ level.   Past Medical History  Diagnosis Date  . THYROID NODULE, LEFT 04/10/2009  . HYPERLIPIDEMIA 08/21/2007  . GOUT 08/21/2007  . HYPERTENSION 08/21/2007    Dr. Andree Elk, [place] 08/21/2007  . CEREBROVASCULAR ACCIDENT, ACUTE 04/15/2010  . GERD 08/21/2007  . RENAL INSUFFICIENCY 08/21/2007  . LUPUS 08/21/2007  . OSTEOPOROSIS 08/21/2007    [facility]  . DVT, HX OF 08/21/2007  . CLOSTRIDIUM DIFFICILE COLITIS, HX OF 08/21/2007  . KIDNEY TRANSPLANTATION, HX OF 08/22/2007    s/p renal transplant-Dr. Andree Elk, [facility]  . Pulmonary embolism ([place]) 07/16/2010  . Renal failure   . Current use of long term anticoagulation     [facility]. Andree Elk, [facility]  . Depression     Dr. Andree Elk, [facility]  . History of stroke with residual effects   . Right sided weakness   . CVA 04/17/2010  . Tachycardia   . DIABETES MELLITUS, TYPE II 08/21/2007  . Candida esophagitis ([place]) 11/12/2014  . Steroid-induced hyperglycemia 11/09/2014    Past Surgical History  Procedure Laterality Date  . Cholecystectomy    . Tubal ligation    . Kidney transplant Right 2009  . Renal biopsy, open  1981  . Enteroscopy N/A 11/11/2014    Procedure: ENTEROSCOPY;  Surgeon: Ladene Artist, MD;  Location: [facility];  Service: Endoscopy;  Laterality: N/A;    Social History   Social History  . Marital Status: Divorced    Spouse Name: N/A  . Number of Children: 1  . Years of Education: N/A   Occupational History  . DISABILITY    Social History Main Topics  . Smoking status: Never Smoker   . Smokeless tobacco: Never Used  . Alcohol Use: No  . Drug Use: No  . Sexual Activity: No    Other Topics Concern  . Not on file   Social History Narrative   Retired   Regular exercise-no   Pt completed hs.     Current Outpatient Prescriptions on File Prior to Visit  Medication Sig Dispense Refill  . alendronate (FOSAMAX) 70 MG tablet Take 70 mg by mouth once a week. Take on Saturdays with a full glass of water on an empty stomach.    Marland Kitchen ammonium lactate (AMLACTIN) 12 % cream Apply 1 g topically 2 (two) times daily as needed for dry skin.     Marland Kitchen atorvastatin (LIPITOR) 40 MG tablet Take 1 tablet (40 mg total) by mouth daily. 30 tablet 0  . Blood Glucose Monitoring Suppl (ONETOUCH VERIO IQ SYSTEM) W/DEVICE KIT Use to check blood sugar 1 time per day 1 kit 0  . calcitRIOL (ROCALTROL) 0.25 MCG capsule TAKE ONE CAPSULE BY MOUTH DAILY 30 capsule 0  . diltiazem (TIAZAC) 240 MG 24 hr capsule Take 1 capsule (240 mg total) by mouth daily. 30 capsule 11  . donepezil (ARICEPT) 10 MG tablet Take 1 tablet (10 mg total) by mouth at bedtime. 30 tablet 8  . esomeprazole (NEXIUM) 20 MG capsule Take 20 mg by mouth daily.    Marland Kitchen glucose blood (ONE TOUCH ULTRA TEST) test strip Use to  check blood sugar 1 time per day 100 each 2  . HYDROcodone-acetaminophen (NORCO/VICODIN) 5-325 MG tablet Take 1-2 tablets by mouth every 4 (four) hours as needed for moderate pain. 15 tablet 0  . losartan (COZAAR) 100 MG tablet Take 1 tablet (100 mg total) by mouth daily. 30 tablet 0  . mirtazapine (REMERON) 30 MG tablet Take 1 tablet (30 mg total) by mouth at bedtime. 30 tablet 1  . mycophenolate (CELLCEPT) 250 MG capsule Take 250 mg by mouth 2 (two) times daily.    Marland Kitchen nystatin (MYCOSTATIN) powder Apply 1 g topically 4 (four) times daily as needed. For yeast under breast    . ondansetron (ZOFRAN ODT) 4 MG disintegrating tablet 41m ODT q4 hours prn nausea/vomiting 15 tablet 0  . ONETOUCH DELICA LANCETS 338LMISC Use to check blood sugar 1 time per day 100 each 2  . oxybutynin (DITROPAN) 5 MG tablet Take 5 mg by mouth daily  as needed for bladder spasms.     .Marland KitchenQUEtiapine (SEROQUEL) 25 MG tablet Take 1 tablet (25 mg total) by mouth at bedtime. 30 tablet 1  . sertraline (ZOLOFT) 100 MG tablet Take 50 mg by mouth daily.    . tacrolimus (PROGRAF) 1 MG capsule Take 1 mg by mouth 2 (two) times daily.     . traMADol (ULTRAM) 50 MG tablet Take 1 tablet (50 mg total) by mouth every 8 (eight) hours as needed. 30 tablet 0  . triamcinolone (KENALOG) 0.025 % cream Apply 1 application topically daily as needed (dry skin).     .Marland Kitchenwarfarin (COUMADIN) 2.5 MG tablet Take 2.5 mg by mouth daily.     .Marland Kitchenwarfarin (COUMADIN) 5 MG tablet Take 1 tablet (5 mg total) by mouth daily. (Patient taking differently: Take 5 mg by mouth every Friday. ) 40 tablet 1   No current facility-administered medications on file prior to visit.    Allergies  Allergen Reactions  . Oxycodone-Acetaminophen Shortness Of Breath and Nausea Only  . Propoxyphene N-Acetaminophen Shortness Of Breath and Nausea Only  . Sulfonamide Derivatives Shortness Of Breath and Nausea Only  . Adhesive [Tape]   . Codeine Nausea Only  . Other Other (See Comments)    No blood, Jehovaeh Witness   . Gabapentin Anxiety    twitching  . Latex Rash  . Metoprolol Rash  . Morphine And Related Rash    IV site on arm is red, patient reports this is improving.  NO shortness of breath reported.    Family History  Problem Relation Age of Onset  . Heart attack Mother   . Heart disease Father   . Asthma Sister   . Asthma Sister   . Asthma Daughter   . Cancer Maternal Grandfather     prostate  . Cancer Paternal Grandfather     colon    BP 122/70 mmHg  Pulse 76  Temp(Src) 98.1 F (36.7 C) (Oral)  Wt 147 lb (66.679 kg)  SpO2 97%   Review of Systems No seizure or visual loss (except she wears glasses).      Objective:   Physical Exam VITAL SIGNS:  See vs page GENERAL: no distress Gait: steady with a cane      Assessment & Plan:  urinary incont: she declines  medication.  Patient is advised the following: Patient Instructions  blood tests are requested for you today.  We'll let you know about the results. We'll send in the form.

## 2015-09-24 LAB — TACROLIMUS LEVEL: Tacrolimus Lvl: 2.7 ng/mL — ABNORMAL LOW (ref 5.0–20.0)

## 2015-09-25 ENCOUNTER — Ambulatory Visit (HOSPITAL_COMMUNITY): Payer: Medicare Other | Admitting: Psychiatry

## 2015-09-25 DIAGNOSIS — I5032 Chronic diastolic (congestive) heart failure: Secondary | ICD-10-CM | POA: Diagnosis not present

## 2015-09-25 DIAGNOSIS — N189 Chronic kidney disease, unspecified: Secondary | ICD-10-CM | POA: Diagnosis not present

## 2015-09-25 DIAGNOSIS — Z7901 Long term (current) use of anticoagulants: Secondary | ICD-10-CM | POA: Diagnosis not present

## 2015-09-25 DIAGNOSIS — M3214 Glomerular disease in systemic lupus erythematosus: Secondary | ICD-10-CM | POA: Diagnosis not present

## 2015-09-25 DIAGNOSIS — Z7952 Long term (current) use of systemic steroids: Secondary | ICD-10-CM | POA: Diagnosis not present

## 2015-09-25 DIAGNOSIS — Z5181 Encounter for therapeutic drug level monitoring: Secondary | ICD-10-CM | POA: Diagnosis not present

## 2015-09-25 DIAGNOSIS — I110 Hypertensive heart disease with heart failure: Secondary | ICD-10-CM | POA: Diagnosis not present

## 2015-09-25 DIAGNOSIS — D6861 Antiphospholipid syndrome: Secondary | ICD-10-CM | POA: Diagnosis not present

## 2015-09-25 DIAGNOSIS — Z7982 Long term (current) use of aspirin: Secondary | ICD-10-CM | POA: Diagnosis not present

## 2015-09-25 DIAGNOSIS — Z79891 Long term (current) use of opiate analgesic: Secondary | ICD-10-CM | POA: Diagnosis not present

## 2015-09-25 DIAGNOSIS — E119 Type 2 diabetes mellitus without complications: Secondary | ICD-10-CM | POA: Diagnosis not present

## 2015-09-25 DIAGNOSIS — N2889 Other specified disorders of kidney and ureter: Secondary | ICD-10-CM | POA: Diagnosis not present

## 2015-09-26 ENCOUNTER — Telehealth: Payer: Self-pay | Admitting: Endocrinology

## 2015-09-26 NOTE — Telephone Encounter (Signed)
I contacted the pt and human services. Human services stated the form needed to state for the level of care domiciliary in the order for the pt to get her assistance. I spoke with Dr. Loanne Drilling verbally on this and he changed the form to reflect the correct level of care. Form has been re faxed and the pt notified.

## 2015-09-26 NOTE — Telephone Encounter (Signed)
Patient state that Dr Loanne Drilling sent a form in social services and the information wasn't right, please call her

## 2015-10-02 ENCOUNTER — Ambulatory Visit (INDEPENDENT_AMBULATORY_CARE_PROVIDER_SITE_OTHER): Payer: Medicare Other | Admitting: Cardiovascular Disease

## 2015-10-02 ENCOUNTER — Ambulatory Visit (HOSPITAL_COMMUNITY): Payer: Medicare Other | Admitting: Psychiatry

## 2015-10-02 ENCOUNTER — Telehealth: Payer: Self-pay | Admitting: Endocrinology

## 2015-10-02 DIAGNOSIS — M3214 Glomerular disease in systemic lupus erythematosus: Secondary | ICD-10-CM | POA: Diagnosis not present

## 2015-10-02 DIAGNOSIS — Z7901 Long term (current) use of anticoagulants: Secondary | ICD-10-CM | POA: Diagnosis not present

## 2015-10-02 DIAGNOSIS — N2889 Other specified disorders of kidney and ureter: Secondary | ICD-10-CM | POA: Diagnosis not present

## 2015-10-02 DIAGNOSIS — D6861 Antiphospholipid syndrome: Secondary | ICD-10-CM | POA: Diagnosis not present

## 2015-10-02 DIAGNOSIS — I6350 Cerebral infarction due to unspecified occlusion or stenosis of unspecified cerebral artery: Secondary | ICD-10-CM

## 2015-10-02 DIAGNOSIS — Z5181 Encounter for therapeutic drug level monitoring: Secondary | ICD-10-CM | POA: Diagnosis not present

## 2015-10-02 DIAGNOSIS — E119 Type 2 diabetes mellitus without complications: Secondary | ICD-10-CM | POA: Diagnosis not present

## 2015-10-02 DIAGNOSIS — I110 Hypertensive heart disease with heart failure: Secondary | ICD-10-CM | POA: Diagnosis not present

## 2015-10-02 DIAGNOSIS — Z7952 Long term (current) use of systemic steroids: Secondary | ICD-10-CM | POA: Diagnosis not present

## 2015-10-02 DIAGNOSIS — Z7982 Long term (current) use of aspirin: Secondary | ICD-10-CM | POA: Diagnosis not present

## 2015-10-02 DIAGNOSIS — N189 Chronic kidney disease, unspecified: Secondary | ICD-10-CM | POA: Diagnosis not present

## 2015-10-02 DIAGNOSIS — I5032 Chronic diastolic (congestive) heart failure: Secondary | ICD-10-CM | POA: Diagnosis not present

## 2015-10-02 DIAGNOSIS — Z79891 Long term (current) use of opiate analgesic: Secondary | ICD-10-CM | POA: Diagnosis not present

## 2015-10-02 LAB — POCT INR: INR: 1.9

## 2015-10-02 NOTE — Telephone Encounter (Signed)
This form has been received and denied. [facility] has been notified numerous times of this denial.

## 2015-10-02 NOTE — Telephone Encounter (Signed)
Lvelvie from [facility], sent a request for patient omega  3 capsules and want to know if you received it, ID # [id number]

## 2015-10-09 ENCOUNTER — Ambulatory Visit (INDEPENDENT_AMBULATORY_CARE_PROVIDER_SITE_OTHER): Payer: Medicare Other | Admitting: Pharmacist

## 2015-10-09 DIAGNOSIS — Z5181 Encounter for therapeutic drug level monitoring: Secondary | ICD-10-CM | POA: Diagnosis not present

## 2015-10-09 DIAGNOSIS — N189 Chronic kidney disease, unspecified: Secondary | ICD-10-CM | POA: Diagnosis not present

## 2015-10-09 DIAGNOSIS — I110 Hypertensive heart disease with heart failure: Secondary | ICD-10-CM | POA: Diagnosis not present

## 2015-10-09 DIAGNOSIS — Z79891 Long term (current) use of opiate analgesic: Secondary | ICD-10-CM | POA: Diagnosis not present

## 2015-10-09 DIAGNOSIS — N2889 Other specified disorders of kidney and ureter: Secondary | ICD-10-CM | POA: Diagnosis not present

## 2015-10-09 DIAGNOSIS — E119 Type 2 diabetes mellitus without complications: Secondary | ICD-10-CM | POA: Diagnosis not present

## 2015-10-09 DIAGNOSIS — I6350 Cerebral infarction due to unspecified occlusion or stenosis of unspecified cerebral artery: Secondary | ICD-10-CM

## 2015-10-09 DIAGNOSIS — Z7982 Long term (current) use of aspirin: Secondary | ICD-10-CM | POA: Diagnosis not present

## 2015-10-09 DIAGNOSIS — Z7952 Long term (current) use of systemic steroids: Secondary | ICD-10-CM | POA: Diagnosis not present

## 2015-10-09 DIAGNOSIS — Z7901 Long term (current) use of anticoagulants: Secondary | ICD-10-CM | POA: Diagnosis not present

## 2015-10-09 DIAGNOSIS — I5032 Chronic diastolic (congestive) heart failure: Secondary | ICD-10-CM | POA: Diagnosis not present

## 2015-10-09 DIAGNOSIS — M3214 Glomerular disease in systemic lupus erythematosus: Secondary | ICD-10-CM | POA: Diagnosis not present

## 2015-10-09 DIAGNOSIS — D6861 Antiphospholipid syndrome: Secondary | ICD-10-CM | POA: Diagnosis not present

## 2015-10-09 LAB — POCT INR: INR: 2.4

## 2015-10-16 DIAGNOSIS — Z7952 Long term (current) use of systemic steroids: Secondary | ICD-10-CM | POA: Diagnosis not present

## 2015-10-16 DIAGNOSIS — Z7982 Long term (current) use of aspirin: Secondary | ICD-10-CM | POA: Diagnosis not present

## 2015-10-16 DIAGNOSIS — Z7901 Long term (current) use of anticoagulants: Secondary | ICD-10-CM | POA: Diagnosis not present

## 2015-10-16 DIAGNOSIS — I110 Hypertensive heart disease with heart failure: Secondary | ICD-10-CM | POA: Diagnosis not present

## 2015-10-16 DIAGNOSIS — I5032 Chronic diastolic (congestive) heart failure: Secondary | ICD-10-CM | POA: Diagnosis not present

## 2015-10-16 DIAGNOSIS — E119 Type 2 diabetes mellitus without complications: Secondary | ICD-10-CM | POA: Diagnosis not present

## 2015-10-16 DIAGNOSIS — Z5181 Encounter for therapeutic drug level monitoring: Secondary | ICD-10-CM | POA: Diagnosis not present

## 2015-10-16 DIAGNOSIS — M3214 Glomerular disease in systemic lupus erythematosus: Secondary | ICD-10-CM | POA: Diagnosis not present

## 2015-10-16 DIAGNOSIS — N189 Chronic kidney disease, unspecified: Secondary | ICD-10-CM | POA: Diagnosis not present

## 2015-10-16 DIAGNOSIS — Z79891 Long term (current) use of opiate analgesic: Secondary | ICD-10-CM | POA: Diagnosis not present

## 2015-10-16 DIAGNOSIS — N2889 Other specified disorders of kidney and ureter: Secondary | ICD-10-CM | POA: Diagnosis not present

## 2015-10-16 DIAGNOSIS — D6861 Antiphospholipid syndrome: Secondary | ICD-10-CM | POA: Diagnosis not present

## 2015-10-23 ENCOUNTER — Ambulatory Visit (INDEPENDENT_AMBULATORY_CARE_PROVIDER_SITE_OTHER): Payer: Medicare Other | Admitting: Cardiology

## 2015-10-23 DIAGNOSIS — M3214 Glomerular disease in systemic lupus erythematosus: Secondary | ICD-10-CM | POA: Diagnosis not present

## 2015-10-23 DIAGNOSIS — Z7901 Long term (current) use of anticoagulants: Secondary | ICD-10-CM | POA: Diagnosis not present

## 2015-10-23 DIAGNOSIS — I5032 Chronic diastolic (congestive) heart failure: Secondary | ICD-10-CM | POA: Diagnosis not present

## 2015-10-23 DIAGNOSIS — N2889 Other specified disorders of kidney and ureter: Secondary | ICD-10-CM | POA: Diagnosis not present

## 2015-10-23 DIAGNOSIS — D6861 Antiphospholipid syndrome: Secondary | ICD-10-CM | POA: Diagnosis not present

## 2015-10-23 DIAGNOSIS — N189 Chronic kidney disease, unspecified: Secondary | ICD-10-CM | POA: Diagnosis not present

## 2015-10-23 DIAGNOSIS — E119 Type 2 diabetes mellitus without complications: Secondary | ICD-10-CM | POA: Diagnosis not present

## 2015-10-23 DIAGNOSIS — Z7982 Long term (current) use of aspirin: Secondary | ICD-10-CM | POA: Diagnosis not present

## 2015-10-23 DIAGNOSIS — Z79891 Long term (current) use of opiate analgesic: Secondary | ICD-10-CM | POA: Diagnosis not present

## 2015-10-23 DIAGNOSIS — Z7952 Long term (current) use of systemic steroids: Secondary | ICD-10-CM | POA: Diagnosis not present

## 2015-10-23 DIAGNOSIS — Z5181 Encounter for therapeutic drug level monitoring: Secondary | ICD-10-CM | POA: Diagnosis not present

## 2015-10-23 DIAGNOSIS — I6350 Cerebral infarction due to unspecified occlusion or stenosis of unspecified cerebral artery: Secondary | ICD-10-CM

## 2015-10-23 DIAGNOSIS — I110 Hypertensive heart disease with heart failure: Secondary | ICD-10-CM | POA: Diagnosis not present

## 2015-10-23 LAB — POCT INR: INR: 2.6

## 2015-10-28 ENCOUNTER — Other Ambulatory Visit: Payer: Medicare Other

## 2015-10-28 ENCOUNTER — Other Ambulatory Visit: Payer: Self-pay | Admitting: Endocrinology

## 2015-10-28 DIAGNOSIS — Z940 Kidney transplant status: Secondary | ICD-10-CM

## 2015-10-29 ENCOUNTER — Other Ambulatory Visit: Payer: Medicare Other

## 2015-10-29 ENCOUNTER — Other Ambulatory Visit: Payer: Self-pay

## 2015-10-29 ENCOUNTER — Other Ambulatory Visit: Payer: Self-pay | Admitting: Cardiovascular Disease

## 2015-10-29 ENCOUNTER — Telehealth: Payer: Self-pay | Admitting: Cardiovascular Disease

## 2015-10-29 DIAGNOSIS — I5032 Chronic diastolic (congestive) heart failure: Secondary | ICD-10-CM | POA: Diagnosis not present

## 2015-10-29 DIAGNOSIS — Z5181 Encounter for therapeutic drug level monitoring: Secondary | ICD-10-CM | POA: Diagnosis not present

## 2015-10-29 DIAGNOSIS — D6861 Antiphospholipid syndrome: Secondary | ICD-10-CM | POA: Diagnosis not present

## 2015-10-29 DIAGNOSIS — N2889 Other specified disorders of kidney and ureter: Secondary | ICD-10-CM | POA: Diagnosis not present

## 2015-10-29 DIAGNOSIS — Z9114 Patient's other noncompliance with medication regimen: Secondary | ICD-10-CM | POA: Diagnosis not present

## 2015-10-29 DIAGNOSIS — Z7952 Long term (current) use of systemic steroids: Secondary | ICD-10-CM | POA: Diagnosis not present

## 2015-10-29 DIAGNOSIS — E119 Type 2 diabetes mellitus without complications: Secondary | ICD-10-CM | POA: Diagnosis not present

## 2015-10-29 DIAGNOSIS — I110 Hypertensive heart disease with heart failure: Secondary | ICD-10-CM | POA: Diagnosis not present

## 2015-10-29 DIAGNOSIS — Z7901 Long term (current) use of anticoagulants: Secondary | ICD-10-CM | POA: Diagnosis not present

## 2015-10-29 DIAGNOSIS — M3214 Glomerular disease in systemic lupus erythematosus: Secondary | ICD-10-CM | POA: Diagnosis not present

## 2015-10-29 DIAGNOSIS — Z940 Kidney transplant status: Secondary | ICD-10-CM | POA: Diagnosis not present

## 2015-10-29 DIAGNOSIS — Z8673 Personal history of transient ischemic attack (TIA), and cerebral infarction without residual deficits: Secondary | ICD-10-CM | POA: Diagnosis not present

## 2015-10-29 DIAGNOSIS — N189 Chronic kidney disease, unspecified: Secondary | ICD-10-CM | POA: Diagnosis not present

## 2015-10-29 LAB — TACROLIMUS LEVEL: Tacrolimus Lvl: 12.4 ng/mL (ref 5.0–20.0)

## 2015-10-29 MED ORDER — SERTRALINE HCL 100 MG PO TABS: 50.0000 mg | ORAL_TABLET | Freq: Every day | ORAL | Status: DC

## 2015-10-29 MED ORDER — DILTIAZEM HCL ER BEADS 240 MG PO CP24: 240.0000 mg | ORAL_CAPSULE | Freq: Every day | ORAL | Status: DC

## 2015-10-29 MED ORDER — ATORVASTATIN CALCIUM 40 MG PO TABS: 40.0000 mg | ORAL_TABLET | Freq: Every day | ORAL | Status: DC

## 2015-10-29 MED ORDER — LOSARTAN POTASSIUM 100 MG PO TABS: 100.0000 mg | ORAL_TABLET | Freq: Every day | ORAL | Status: DC

## 2015-10-29 NOTE — Telephone Encounter (Signed)
Levada Dy from [facility] is calling to get a Verbal Order to continue to see Connie Ruiz for 1wk 9 for INR check . Please call ( Can leave a message , she has a secured Voicemail)   Thanks

## 2015-10-29 NOTE — Telephone Encounter (Signed)
LEFT MESSAGE TO RESEND RE-CERT. PLACE TO ATTENTION DR Kennon Holter NURSE DR BERRY WILL BE IN OFFICE 11/04/15

## 2015-10-29 NOTE — Telephone Encounter (Signed)
Please advise 

## 2015-10-29 NOTE — Telephone Encounter (Signed)
Spoke with Levada Dy [facility] RN & she need to speak with Dr. Kennon Holter nurse regarding plan of care re-certification on the patient. She has paperwork that needs a signature. Please call Levada Dy. Thanks

## 2015-11-05 ENCOUNTER — Ambulatory Visit (INDEPENDENT_AMBULATORY_CARE_PROVIDER_SITE_OTHER): Payer: Medicare Other | Admitting: Internal Medicine

## 2015-11-05 ENCOUNTER — Telehealth: Payer: Self-pay | Admitting: *Deleted

## 2015-11-05 DIAGNOSIS — Z8673 Personal history of transient ischemic attack (TIA), and cerebral infarction without residual deficits: Secondary | ICD-10-CM | POA: Diagnosis not present

## 2015-11-05 DIAGNOSIS — I110 Hypertensive heart disease with heart failure: Secondary | ICD-10-CM | POA: Diagnosis not present

## 2015-11-05 DIAGNOSIS — Z9114 Patient's other noncompliance with medication regimen: Secondary | ICD-10-CM | POA: Diagnosis not present

## 2015-11-05 DIAGNOSIS — E119 Type 2 diabetes mellitus without complications: Secondary | ICD-10-CM | POA: Diagnosis not present

## 2015-11-05 DIAGNOSIS — Z7952 Long term (current) use of systemic steroids: Secondary | ICD-10-CM | POA: Diagnosis not present

## 2015-11-05 DIAGNOSIS — I6350 Cerebral infarction due to unspecified occlusion or stenosis of unspecified cerebral artery: Secondary | ICD-10-CM

## 2015-11-05 DIAGNOSIS — Z940 Kidney transplant status: Secondary | ICD-10-CM | POA: Diagnosis not present

## 2015-11-05 DIAGNOSIS — D6861 Antiphospholipid syndrome: Secondary | ICD-10-CM | POA: Diagnosis not present

## 2015-11-05 DIAGNOSIS — I5032 Chronic diastolic (congestive) heart failure: Secondary | ICD-10-CM | POA: Diagnosis not present

## 2015-11-05 DIAGNOSIS — N189 Chronic kidney disease, unspecified: Secondary | ICD-10-CM | POA: Diagnosis not present

## 2015-11-05 DIAGNOSIS — M3214 Glomerular disease in systemic lupus erythematosus: Secondary | ICD-10-CM | POA: Diagnosis not present

## 2015-11-05 DIAGNOSIS — Z5181 Encounter for therapeutic drug level monitoring: Secondary | ICD-10-CM | POA: Diagnosis not present

## 2015-11-05 DIAGNOSIS — Z7901 Long term (current) use of anticoagulants: Secondary | ICD-10-CM | POA: Diagnosis not present

## 2015-11-05 DIAGNOSIS — N2889 Other specified disorders of kidney and ureter: Secondary | ICD-10-CM | POA: Diagnosis not present

## 2015-11-05 LAB — POCT INR: INR: 2.1

## 2015-11-05 NOTE — Telephone Encounter (Signed)
Spoke with Scientist, product/process development with [facility] and she states pt's dentist doing surgery on Friday and wanted to know what INR is today. Order give to do INR today and call Dentist with results and ask dentist if needs to hold coumadin and to call coumadin clinic with results

## 2015-11-05 NOTE — Telephone Encounter (Signed)
Returned call to [place] at [place]. Left detailed message that I will fax over the pt/INR/coumadin order from [facility] that was received. Instructed her to call us back if there is anything else that is needed.

## 2015-11-06 ENCOUNTER — Telehealth: Payer: Self-pay | Admitting: *Deleted

## 2015-11-06 NOTE — Telephone Encounter (Signed)
Coumadin orders from [facility] from 09/18/15 and 10/23/15 signed by MD and faxed.

## 2015-11-12 DIAGNOSIS — N2889 Other specified disorders of kidney and ureter: Secondary | ICD-10-CM | POA: Diagnosis not present

## 2015-11-12 DIAGNOSIS — M3214 Glomerular disease in systemic lupus erythematosus: Secondary | ICD-10-CM | POA: Diagnosis not present

## 2015-11-12 DIAGNOSIS — Z7901 Long term (current) use of anticoagulants: Secondary | ICD-10-CM | POA: Diagnosis not present

## 2015-11-12 DIAGNOSIS — Z5181 Encounter for therapeutic drug level monitoring: Secondary | ICD-10-CM | POA: Diagnosis not present

## 2015-11-12 DIAGNOSIS — D6861 Antiphospholipid syndrome: Secondary | ICD-10-CM | POA: Diagnosis not present

## 2015-11-12 DIAGNOSIS — Z9114 Patient's other noncompliance with medication regimen: Secondary | ICD-10-CM | POA: Diagnosis not present

## 2015-11-12 DIAGNOSIS — E119 Type 2 diabetes mellitus without complications: Secondary | ICD-10-CM | POA: Diagnosis not present

## 2015-11-12 DIAGNOSIS — Z7952 Long term (current) use of systemic steroids: Secondary | ICD-10-CM | POA: Diagnosis not present

## 2015-11-12 DIAGNOSIS — I5032 Chronic diastolic (congestive) heart failure: Secondary | ICD-10-CM | POA: Diagnosis not present

## 2015-11-12 DIAGNOSIS — Z8673 Personal history of transient ischemic attack (TIA), and cerebral infarction without residual deficits: Secondary | ICD-10-CM | POA: Diagnosis not present

## 2015-11-12 DIAGNOSIS — Z940 Kidney transplant status: Secondary | ICD-10-CM | POA: Diagnosis not present

## 2015-11-12 DIAGNOSIS — N189 Chronic kidney disease, unspecified: Secondary | ICD-10-CM | POA: Diagnosis not present

## 2015-11-12 DIAGNOSIS — I110 Hypertensive heart disease with heart failure: Secondary | ICD-10-CM | POA: Diagnosis not present

## 2015-11-14 ENCOUNTER — Other Ambulatory Visit: Payer: Self-pay | Admitting: Cardiovascular Disease

## 2015-11-19 ENCOUNTER — Encounter: Payer: Self-pay | Admitting: Neurology

## 2015-11-19 ENCOUNTER — Ambulatory Visit (INDEPENDENT_AMBULATORY_CARE_PROVIDER_SITE_OTHER): Payer: Medicare Other | Admitting: Neurology

## 2015-11-19 VITALS — BP 116/62 | HR 74 | Ht 64.0 in | Wt 144.0 lb

## 2015-11-19 DIAGNOSIS — F0150 Vascular dementia without behavioral disturbance: Secondary | ICD-10-CM

## 2015-11-19 DIAGNOSIS — I69320 Aphasia following cerebral infarction: Secondary | ICD-10-CM

## 2015-11-19 DIAGNOSIS — E785 Hyperlipidemia, unspecified: Secondary | ICD-10-CM

## 2015-11-19 DIAGNOSIS — Z794 Long term (current) use of insulin: Secondary | ICD-10-CM

## 2015-11-19 DIAGNOSIS — I10 Essential (primary) hypertension: Secondary | ICD-10-CM

## 2015-11-19 DIAGNOSIS — E1122 Type 2 diabetes mellitus with diabetic chronic kidney disease: Secondary | ICD-10-CM

## 2015-11-19 DIAGNOSIS — I69359 Hemiplegia and hemiparesis following cerebral infarction affecting unspecified side: Secondary | ICD-10-CM

## 2015-11-19 NOTE — Progress Notes (Signed)
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  UPDATE: She is taking Aricept 37m daily.  She is taking Lipitor 456mdaily.  LDL from 07/31/15 was 54.  Hgb A1c was 5.6. For agitation, she was prescribed Seroquel 2524mt bedtime.  Once her agitation improved, it was discontinued.  She stopped mirtazapine and instead is taking sertraline.  She is doing well.  She is not agitated.  She sleeps well.  Her appetite is good.  She is still living with her daughter.  She goes on routine walks with her aide.  HISTORY: She has history of significant cerebrovascular disease and stroke.  She last presented to the hospital on 06/14/13 with worsened left facial droop, right sided weakness and dysarthria.  MRI of the head showed remote left MCA infarct but no acute findings.  MRA of the head showed atherosclerotic changes in the anterior and posterior circulation with partial recanalization of the left MCA.  For a few years, she has had some memory problems.  For example, she would often forget having just seen her children from time to time.  She also expresses that she feels people don't care about her.  She does report depression.  She still lives by herself.  She does not drive.  Since her stroke, she requires assistance in regards to her ADLs.  A home health aid helps her with dressing and bathing.  She has a pill box but she does not appear to be compliant with all of her medications, particularly the Lipitor.  Her daughter handles her finances.  She does not have problems recalling names or faces of people she knows (except once  regarding a nephew in a picture).  04/05/14:  Hgb A1c 6.0 10/22/13:  cholesterol 277, TG 126, HDL 54, LDL 198 06/15/13 Carotid doppler:  1-39% bilateral ICA stenosis 06/15/13 2D Echo:  LVEF 60-00-34%rade 1 diastolic dysfunction  She is followed for her lupus by rheumatology at [facility] Medical History  Diagnosis Date  . THYROID NODULE, LEFT 04/10/2009  . HYPERLIPIDEMIA 08/21/2007  . GOUT 08/21/2007  . HYPERTENSION 08/21/2007    Dr. [place]ree [place]16/2009  . CEREBROVASCULAR ACCIDENT, ACUTE 04/15/2010  . GERD 08/21/2007  . RENAL INSUFFICIENCY 08/21/2007  . LUPUS 08/21/2007  . OSTEOPOROSIS 08/21/2007    [facility] at [facility]  . DVT, HX OF 08/21/2007  . CLOSTRIDIUM DIFFICILE COLITIS, HX OF 08/21/2007  . KIDNEY TRANSPLANTATION, HX OF 08/22/2007    [facility]  . Renal failure   . [facility] use of long term anticoagulation     [facility]     [facility] History of stroke with residual effects   . Right sided weakness   . CVA 04/17/2010  . Tachycardia   . DIABETES MELLITUS, TYPE II 08/21/2007  . Candida esophagitis ([facility] [place]/12/2014  . Steroid-induced hyperglycemia 11/09/2014    MEDICATIONS: Current Outpatient Prescriptions on File Prior to Visit  Medication Sig Dispense Refill  . alendronate (FOSAMAX) 70 MG tablet Take 70 mg by mouth once a week. Take  on Saturdays with a full glass of water on an empty stomach.    Marland Kitchen ammonium lactate (AMLACTIN) 12 % cream Apply 1 g topically 2 (two) times daily as needed for dry skin.     Marland Kitchen atorvastatin (LIPITOR) 40 MG tablet Take 1 tablet (40 mg total) by mouth daily. 30 tablet 5  . Blood Glucose Monitoring Suppl (ONETOUCH VERIO IQ SYSTEM) W/DEVICE KIT Use to check blood sugar 1 time per day 1 kit 0  . calcitRIOL (ROCALTROL) 0.25 MCG capsule TAKE ONE CAPSULE BY MOUTH DAILY 30 capsule 0  . diltiazem (TIAZAC) 240  MG 24 hr capsule Take 1 capsule (240 mg total) by mouth daily. 30 capsule 11  . donepezil (ARICEPT) 10 MG tablet Take 1 tablet (10 mg total) by mouth at bedtime. 30 tablet 8  . esomeprazole (NEXIUM) 20 MG capsule Take 20 mg by mouth daily.    Marland Kitchen glucose blood (ONE TOUCH ULTRA TEST) test strip Use to check blood sugar 1 time per day 100 each 2  . losartan (COZAAR) 100 MG tablet Take 1 tablet (100 mg total) by mouth daily. 30 tablet 5  . mycophenolate (CELLCEPT) 250 MG capsule Take 250 mg by mouth 2 (two) times daily.    Marland Kitchen nystatin (MYCOSTATIN) powder Apply 1 g topically 4 (four) times daily as needed. For yeast under breast    . ondansetron (ZOFRAN ODT) 4 MG disintegrating tablet 48m ODT q4 hours prn nausea/vomiting 15 tablet 0  . ONETOUCH DELICA LANCETS 347QMISC Use to check blood sugar 1 time per day 100 each 2  . oxybutynin (DITROPAN) 5 MG tablet Take 5 mg by mouth daily as needed for bladder spasms.     .Marland Kitchensertraline (ZOLOFT) 100 MG tablet Take 0.5 tablets (50 mg total) by mouth daily. 15 tablet 2  . tacrolimus (PROGRAF) 1 MG capsule Take 1 mg by mouth 2 (two) times daily.     . traMADol (ULTRAM) 50 MG tablet Take 1 tablet (50 mg total) by mouth every 8 (eight) hours as needed. 30 tablet 0  . triamcinolone (KENALOG) 0.025 % cream Apply 1 application topically daily as needed (dry skin).     .Marland Kitchenwarfarin (COUMADIN) 5 MG tablet Take 1 to 2 tablets by mouth daily as directed by coumadin clinic 30 tablet 0  . HYDROcodone-acetaminophen (NORCO/VICODIN) 5-325 MG tablet Take 1-2 tablets by mouth every 4 (four) hours as needed for moderate pain. (Patient not taking: Reported on 11/19/2015) 15 tablet 0  . mirtazapine (REMERON) 30 MG tablet Take 1 tablet (30 mg total) by mouth at bedtime. (Patient not taking: Reported on 11/19/2015) 30 tablet 1  . QUEtiapine (SEROQUEL) 25 MG tablet Take 1 tablet (25 mg total) by mouth at bedtime. (Patient not taking: Reported on 11/19/2015) 30 tablet 1   No current  facility-administered medications on file prior to visit.    ALLERGIES: Allergies  Allergen Reactions  . Oxycodone-Acetaminophen Shortness Of Breath and Nausea Only  . Propoxyphene N-Acetaminophen Shortness Of Breath and Nausea Only  . Sulfonamide Derivatives Shortness Of Breath and Nausea Only  . Adhesive [Tape]   . Codeine Nausea Only  . Other Other (See Comments)    No blood, Jehovaeh Witness   . Gabapentin Anxiety    twitching  . Latex Rash  . Metoprolol Rash  . Morphine And Related Rash    IV site on arm is red, patient reports this is improving.  NO shortness of breath reported.    FAMILY HISTORY: Family History  Problem Relation Age of Onset  . Heart attack Mother   . Heart disease Father   . Asthma Sister   . Asthma Sister   . Asthma Daughter   . Cancer Maternal Grandfather     prostate  . Cancer Paternal Grandfather     colon    SOCIAL HISTORY: Social History   Social History  . Marital Status: Divorced    Spouse Name: N/A  . Number of Children: 1  . Years of Education: N/A   Occupational History  . DISABILITY    Social History Main Topics  . Smoking status: Never Smoker   . Smokeless tobacco: Never Used  . Alcohol Use: No  . Drug Use: No  . Sexual Activity: No   Other Topics Concern  . Not on file   Social History Narrative   Retired   Regular exercise-no   Pt completed hs.     REVIEW OF SYSTEMS: Constitutional: No fevers, chills, or sweats, no generalized fatigue, change in appetite Eyes: No visual changes, double vision, eye pain Ear, nose and throat: No hearing loss, ear pain, nasal congestion, sore throat Cardiovascular: No chest pain, palpitations Respiratory:  No shortness of breath at rest or with exertion, wheezes GastrointestinaI: No nausea, vomiting, diarrhea, abdominal pain, fecal incontinence Genitourinary:  No dysuria, urinary retention or frequency Musculoskeletal:  No neck pain, back pain Integumentary: No rash,  pruritus, skin lesions Neurological: as above Psychiatric: No depression, insomnia, anxiety Endocrine: No palpitations, fatigue, diaphoresis, mood swings, change in appetite, change in weight, increased thirst Hematologic/Lymphatic:  No purpura, petechiae. Allergic/Immunologic: no itchy/runny eyes, nasal congestion, recent allergic reactions, rashes  PHYSICAL EXAM: Filed Vitals:   11/19/15 1348  BP: 116/62  Pulse: 74   General: No acute distress.  Patient appears well-groomed.   Head:  Normocephalic/atraumatic Eyes:  Fundi examined but not visualized Neck: supple, no paraspinal tenderness, full range of motion Heart:  Regular rate and rhythm Lungs:  Clear to auscultation bilaterally Back: No paraspinal tenderness Neurological Exam: alert and oriented to person, place, and time.  Attention span and concentration fair, delayed recall good and remote memory intact, fund of knowledge intact  Speech and language aphasic with some hesitancy in speech output and naming fluency but able to follow commands.  Speech is not dysarthric.    Montreal Cognitive Assessment  11/19/2015 05/08/2015 05/29/2014  Visuospatial/ Executive (0/5) _0 Naming (0/3) _1 Attention: Read list of digits (0/2) 1 1 0  Attention: Read list of letters (0/1) _2 Attention: Serial 7 subtraction starting at 100 (0/3) 0 0 3  Language: Repeat phrase (0/2) 2 1 0  Language : Fluency (0/1) 0 0 0  Abstraction (0/2) 1 0 1  Delayed Recall (0/5) 4 0 0  Orientation (0/6) _3 Total _4 Adjusted Score (based on education) _5 right ptosis, reduced facial sensation on right (V1-V3) and mild right lower facial weakness.  Otherwise, CN II-XII intact. Bulk and tone normal, muscle strength 5-/5 right upper extremity and hip flexion.  Otherwise 5/5.  Deep tendon reflexes 2+ throughout, toes downgoing.  Finger to nose and heel to shin testing intact.  Wide-based wobbling gait.  IMPRESSION: Vascular dementia,  stable/improved overall Hemiparesis and aphasia as late effect of stroke Type 2 diabetes Hyperlipidemia Hypertension  PLAN: 1.  Continue Aricept 52m at bedtime 2.  Continue sertraline for mood/sleep/agitation 3.  Continue anticoagulation 4.  Continue Lipitor  83m (LDL at goal of less than 70) 5.  Continue blood pressure and glycemic control 6.  Recommend Mediterranean diet and continued routine exercise 7.  Follow up in 9 months.  27 minutes spent face to face with patient, over 50% spent discussing diagnosis and counseling  AMetta Clines DO  CC:  SRenato Shin MD

## 2015-11-19 NOTE — Patient Instructions (Addendum)
1.  Continue Aricept 10mg  at bedtime 2.  Continue routine walks with your aide 3.  Mediterranean diet    Why follow it? Research shows. . Those who follow the Mediterranean diet have a reduced risk of heart disease  . The diet is associated with a reduced incidence of Parkinson's and Alzheimer's diseases . People following the diet may have longer life expectancies and lower rates of chronic diseases  . The Dietary Guidelines for Americans recommends the Mediterranean diet as an eating plan to promote health and prevent disease  What Is the Mediterranean Diet?  . Healthy eating plan based on typical foods and recipes of Mediterranean-style cooking . The diet is primarily a plant based diet; these foods should make up a majority of meals   Starches - Plant based foods should make up a majority of meals - They are an important sources of vitamins, minerals, energy, antioxidants, and fiber - Choose whole grains, foods high in fiber and minimally processed items  - Typical grain sources include wheat, oats, barley, corn, brown rice, bulgar, farro, millet, polenta, couscous  - Various types of beans include chickpeas, lentils, fava beans, black beans, white beans   Fruits  Veggies - Large quantities of antioxidant rich fruits & veggies; 6 or more servings  - Vegetables can be eaten raw or lightly drizzled with oil and cooked  - Vegetables common to the traditional Mediterranean Diet include: artichokes, arugula, beets, broccoli, brussel sprouts, cabbage, carrots, celery, collard greens, cucumbers, eggplant, kale, leeks, lemons, lettuce, mushrooms, okra, onions, peas, peppers, potatoes, pumpkin, radishes, rutabaga, shallots, spinach, sweet potatoes, turnips, zucchini - Fruits common to the Mediterranean Diet include: apples, apricots, avocados, cherries, clementines, dates, figs, grapefruits, grapes, melons, nectarines, oranges, peaches, pears, pomegranates, strawberries, tangerines  Fats - Replace  butter and margarine with healthy oils, such as olive oil, canola oil, and tahini  - Limit nuts to no more than a handful a day  - Nuts include walnuts, almonds, pecans, pistachios, pine nuts  - Limit or avoid candied, honey roasted or heavily salted nuts - Olives are central to the [employer] - can be eaten whole or used in a variety of dishes   Meats Protein - Limiting red meat: no more than a few times a month - When eating red meat: choose lean cuts and keep the portion to the size of deck of cards - Eggs: approx. 0 to 4 times a week  - Fish and lean poultry: at least 2 a week  - Healthy protein sources include, chicken, [place], lean beef, lamb - Increase intake of seafood such as tuna, salmon, trout, mackerel, shrimp, scallops - Avoid or limit high fat processed meats such as sausage and bacon  Dairy - Include moderate amounts of low fat dairy products  - Focus on healthy dairy such as fat free yogurt, skim milk, low or reduced fat cheese - Limit dairy products higher in fat such as whole or 2% milk, cheese, ice cream  Alcohol - Moderate amounts of red wine is ok  - No more than 5 oz daily for women (all ages) and men older than age 83  - No more than 10 oz of wine daily for men younger than 75  Other - Limit sweets and other desserts  - Use herbs and spices instead of salt to flavor foods  - Herbs and spices common to the traditional Mediterranean Diet include: basil, bay leaves, chives, cloves, cumin, fennel, garlic, lavender, marjoram, mint, oregano, parsley, pepper,  rosemary, sage, savory, sumac, tarragon, thyme   It's not just a diet, it's a lifestyle:  . The Mediterranean diet includes lifestyle factors typical of those in the region  . Foods, drinks and meals are best eaten with others and savored . Daily physical activity is important for overall good health . This could be strenuous exercise like running and aerobics . This could also be more leisurely activities such  as walking, housework, yard-work, or taking the stairs . Moderation is the key; a balanced and healthy diet accommodates most foods and drinks . Consider portion sizes and frequency of consumption of certain foods   Meal Ideas & Options:  . Breakfast:  o Whole wheat toast or whole wheat English muffins with peanut butter & hard boiled egg o Steel cut oats topped with apples & cinnamon and skim milk  o Fresh fruit: banana, strawberries, melon, berries, peaches  o Smoothies: strawberries, bananas, greek yogurt, peanut butter o Low fat greek yogurt with blueberries and granola  o Egg white omelet with spinach and mushrooms o Breakfast couscous: whole wheat couscous, apricots, skim milk, cranberries  . Sandwiches:  o Hummus and grilled vegetables (peppers, zucchini, squash) on whole wheat bread   o Grilled chicken on whole wheat pita with lettuce, tomatoes, cucumbers or tzatziki  o Tuna salad on whole wheat bread: tuna salad made with greek yogurt, olives, red peppers, capers, green onions o Garlic rosemary lamb pita: lamb sauted with garlic, rosemary, salt & pepper; add lettuce, cucumber, greek yogurt to pita - flavor with lemon juice and black pepper  . Seafood:  o Mediterranean grilled salmon, seasoned with garlic, basil, parsley, lemon juice and black pepper o Shrimp, lemon, and spinach whole-grain pasta salad made with low fat greek yogurt  o Seared scallops with lemon orzo  o Seared tuna steaks seasoned salt, pepper, coriander topped with tomato mixture of olives, tomatoes, olive oil, minced garlic, parsley, green onions and cappers  . Meats:  o Herbed greek chicken salad with kalamata olives, cucumber, feta  o Red bell peppers stuffed with spinach, bulgur, lean ground beef (or lentils) & topped with feta   o Kebabs: skewers of chicken, tomatoes, onions, zucchini, squash  o [place] burgers: made with red onions, mint, dill, lemon juice, feta cheese topped with roasted red  peppers . Vegetarian o Cucumber salad: cucumbers, artichoke hearts, celery, red onion, feta cheese, tossed in olive oil & lemon juice  o Hummus and whole grain pita points with a greek salad (lettuce, tomato, feta, olives, cucumbers, red onion) o Lentil soup with celery, carrots made with vegetable broth, garlic, salt and pepper  o Tabouli salad: parsley, bulgur, mint, scallions, cucumbers, tomato, radishes, lemon juice, olive oil, salt and pepper. 3.  Continue Zoloft (sertraline) for mood 4.  Continue Lipitor for cholesterol 5.  Continue blood pressure and diabetes control 6.  Follow up in 9 months

## 2015-11-24 ENCOUNTER — Other Ambulatory Visit: Payer: Self-pay

## 2015-11-24 DIAGNOSIS — N2889 Other specified disorders of kidney and ureter: Secondary | ICD-10-CM | POA: Diagnosis not present

## 2015-11-24 DIAGNOSIS — Z7901 Long term (current) use of anticoagulants: Secondary | ICD-10-CM | POA: Diagnosis not present

## 2015-11-24 DIAGNOSIS — I110 Hypertensive heart disease with heart failure: Secondary | ICD-10-CM | POA: Diagnosis not present

## 2015-11-24 DIAGNOSIS — Z5181 Encounter for therapeutic drug level monitoring: Secondary | ICD-10-CM | POA: Diagnosis not present

## 2015-11-24 DIAGNOSIS — Z940 Kidney transplant status: Secondary | ICD-10-CM | POA: Diagnosis not present

## 2015-11-24 DIAGNOSIS — N189 Chronic kidney disease, unspecified: Secondary | ICD-10-CM | POA: Diagnosis not present

## 2015-11-24 DIAGNOSIS — E119 Type 2 diabetes mellitus without complications: Secondary | ICD-10-CM | POA: Diagnosis not present

## 2015-11-24 DIAGNOSIS — D6861 Antiphospholipid syndrome: Secondary | ICD-10-CM | POA: Diagnosis not present

## 2015-11-24 DIAGNOSIS — Z9114 Patient's other noncompliance with medication regimen: Secondary | ICD-10-CM | POA: Diagnosis not present

## 2015-11-24 DIAGNOSIS — Z7952 Long term (current) use of systemic steroids: Secondary | ICD-10-CM | POA: Diagnosis not present

## 2015-11-24 DIAGNOSIS — M3214 Glomerular disease in systemic lupus erythematosus: Secondary | ICD-10-CM | POA: Diagnosis not present

## 2015-11-24 DIAGNOSIS — Z8673 Personal history of transient ischemic attack (TIA), and cerebral infarction without residual deficits: Secondary | ICD-10-CM | POA: Diagnosis not present

## 2015-11-24 DIAGNOSIS — I5032 Chronic diastolic (congestive) heart failure: Secondary | ICD-10-CM | POA: Diagnosis not present

## 2015-11-24 MED ORDER — SERTRALINE HCL 100 MG PO TABS: 50.0000 mg | ORAL_TABLET | Freq: Every day | ORAL | Status: DC

## 2015-11-27 ENCOUNTER — Ambulatory Visit (INDEPENDENT_AMBULATORY_CARE_PROVIDER_SITE_OTHER): Payer: Medicare Other | Admitting: Podiatry

## 2015-11-27 DIAGNOSIS — M79676 Pain in unspecified toe(s): Secondary | ICD-10-CM

## 2015-11-27 DIAGNOSIS — E1159 Type 2 diabetes mellitus with other circulatory complications: Secondary | ICD-10-CM | POA: Diagnosis not present

## 2015-11-27 DIAGNOSIS — B351 Tinea unguium: Secondary | ICD-10-CM | POA: Diagnosis not present

## 2015-11-27 DIAGNOSIS — M79609 Pain in unspecified limb: Principal | ICD-10-CM

## 2015-11-27 NOTE — Progress Notes (Signed)
   Subjective:    Patient ID: Connie Ruiz, female    DOB: 01/30/1961, 55 y.o.   MRN: [id number]  HPI this patient presents the office with chief complaint of long thick painful nails. Patient has medical history of lupus, stroke as well as diabetes. Patient states that she has pain walking and wearing her shoes. She presents the office today for preventative foot care services    Review of Systems  All other systems reviewed and are negative.      Objective:   Physical Exam GENERAL APPEARANCE: Alert, conversant. Appropriately groomed. No acute distress.  VASCULAR: Pedal pulses are  Not  palpable at  DP and PT bilateral.  Capillary refill time is immediate to all digits,  Normal temperature gradient.    NEUROLOGIC: sensation is normal to 5.07 monofilament at 5/5 sites bilateral.  Light touch is intact bilateral, Muscle strength normal.  MUSCULOSKELETAL: acceptable muscle strength, tone and stability bilateral.  Intrinsic muscluature intact bilateral.  Rectus appearance of foot and digits noted bilateral. Hallux varus B/L  DERMATOLOGIC: skin color, texture, and turgor are within normal limits.  No preulcerative lesions or ulcers  are seen, no interdigital maceration noted.  No open lesions present.  . No drainage noted.  NAILS  Thick disfigured discolored nails both feet.        Assessment & Plan:  Onychomycosis  DM with angiopathy  Debridement of Nails  RTC 3 months.Cassidie Veiga DPM 

## 2015-12-03 ENCOUNTER — Ambulatory Visit (INDEPENDENT_AMBULATORY_CARE_PROVIDER_SITE_OTHER): Payer: Medicare Other | Admitting: Pharmacist

## 2015-12-03 DIAGNOSIS — E119 Type 2 diabetes mellitus without complications: Secondary | ICD-10-CM | POA: Diagnosis not present

## 2015-12-03 DIAGNOSIS — I110 Hypertensive heart disease with heart failure: Secondary | ICD-10-CM | POA: Diagnosis not present

## 2015-12-03 DIAGNOSIS — Z7901 Long term (current) use of anticoagulants: Secondary | ICD-10-CM | POA: Diagnosis not present

## 2015-12-03 DIAGNOSIS — Z5181 Encounter for therapeutic drug level monitoring: Secondary | ICD-10-CM | POA: Diagnosis not present

## 2015-12-03 DIAGNOSIS — I5032 Chronic diastolic (congestive) heart failure: Secondary | ICD-10-CM | POA: Diagnosis not present

## 2015-12-03 DIAGNOSIS — N2889 Other specified disorders of kidney and ureter: Secondary | ICD-10-CM | POA: Diagnosis not present

## 2015-12-03 DIAGNOSIS — D6861 Antiphospholipid syndrome: Secondary | ICD-10-CM | POA: Diagnosis not present

## 2015-12-03 DIAGNOSIS — Z9114 Patient's other noncompliance with medication regimen: Secondary | ICD-10-CM | POA: Diagnosis not present

## 2015-12-03 DIAGNOSIS — Z7952 Long term (current) use of systemic steroids: Secondary | ICD-10-CM | POA: Diagnosis not present

## 2015-12-03 DIAGNOSIS — Z940 Kidney transplant status: Secondary | ICD-10-CM | POA: Diagnosis not present

## 2015-12-03 DIAGNOSIS — N189 Chronic kidney disease, unspecified: Secondary | ICD-10-CM | POA: Diagnosis not present

## 2015-12-03 DIAGNOSIS — M3214 Glomerular disease in systemic lupus erythematosus: Secondary | ICD-10-CM | POA: Diagnosis not present

## 2015-12-03 DIAGNOSIS — I6350 Cerebral infarction due to unspecified occlusion or stenosis of unspecified cerebral artery: Secondary | ICD-10-CM

## 2015-12-03 DIAGNOSIS — Z8673 Personal history of transient ischemic attack (TIA), and cerebral infarction without residual deficits: Secondary | ICD-10-CM | POA: Diagnosis not present

## 2015-12-03 LAB — POCT INR: INR: 1.1

## 2015-12-10 ENCOUNTER — Ambulatory Visit (HOSPITAL_COMMUNITY): Payer: Medicare Other | Admitting: Psychiatry

## 2015-12-11 ENCOUNTER — Telehealth: Payer: Self-pay | Admitting: *Deleted

## 2015-12-11 ENCOUNTER — Ambulatory Visit (INDEPENDENT_AMBULATORY_CARE_PROVIDER_SITE_OTHER): Payer: Medicare Other | Admitting: Cardiology

## 2015-12-11 DIAGNOSIS — N189 Chronic kidney disease, unspecified: Secondary | ICD-10-CM | POA: Diagnosis not present

## 2015-12-11 DIAGNOSIS — Z7901 Long term (current) use of anticoagulants: Secondary | ICD-10-CM | POA: Diagnosis not present

## 2015-12-11 DIAGNOSIS — Z940 Kidney transplant status: Secondary | ICD-10-CM | POA: Diagnosis not present

## 2015-12-11 DIAGNOSIS — D6861 Antiphospholipid syndrome: Secondary | ICD-10-CM | POA: Diagnosis not present

## 2015-12-11 DIAGNOSIS — Z8673 Personal history of transient ischemic attack (TIA), and cerebral infarction without residual deficits: Secondary | ICD-10-CM | POA: Diagnosis not present

## 2015-12-11 DIAGNOSIS — E119 Type 2 diabetes mellitus without complications: Secondary | ICD-10-CM | POA: Diagnosis not present

## 2015-12-11 DIAGNOSIS — Z7952 Long term (current) use of systemic steroids: Secondary | ICD-10-CM | POA: Diagnosis not present

## 2015-12-11 DIAGNOSIS — I5032 Chronic diastolic (congestive) heart failure: Secondary | ICD-10-CM | POA: Diagnosis not present

## 2015-12-11 DIAGNOSIS — Z5181 Encounter for therapeutic drug level monitoring: Secondary | ICD-10-CM | POA: Diagnosis not present

## 2015-12-11 DIAGNOSIS — M3214 Glomerular disease in systemic lupus erythematosus: Secondary | ICD-10-CM | POA: Diagnosis not present

## 2015-12-11 DIAGNOSIS — Z9114 Patient's other noncompliance with medication regimen: Secondary | ICD-10-CM | POA: Diagnosis not present

## 2015-12-11 DIAGNOSIS — N2889 Other specified disorders of kidney and ureter: Secondary | ICD-10-CM | POA: Diagnosis not present

## 2015-12-11 DIAGNOSIS — I110 Hypertensive heart disease with heart failure: Secondary | ICD-10-CM | POA: Diagnosis not present

## 2015-12-11 DIAGNOSIS — I6350 Cerebral infarction due to unspecified occlusion or stenosis of unspecified cerebral artery: Secondary | ICD-10-CM

## 2015-12-11 LAB — POCT INR: INR: 2.2

## 2015-12-11 NOTE — Telephone Encounter (Signed)
Spoke with pt See coumadin encounter for this date

## 2015-12-11 NOTE — Telephone Encounter (Signed)
-----   Message from Erskine Emery, [facility] sent at 12/11/2015  4:38 PM EDT ----- Regarding: INR Connie Ruiz - [place] nurse called and left INR results on our machine  INR today 12/11/15 - 2.2  He stated that she is alternating 5mg  with 2.5mg .   His call back number is [phone] and patient's number is [phone].

## 2015-12-24 ENCOUNTER — Telehealth: Payer: Self-pay | Admitting: *Deleted

## 2015-12-24 NOTE — Telephone Encounter (Signed)
Home Health Certification and Plan of Care for period 10/28/2015 to 12/26/2015 signed by Dr Gwenlyn Found and faxed to number provided. Placed to be scanned into EPIC.

## 2015-12-25 ENCOUNTER — Ambulatory Visit (INDEPENDENT_AMBULATORY_CARE_PROVIDER_SITE_OTHER): Payer: Medicare Other | Admitting: Interventional Cardiology

## 2015-12-25 DIAGNOSIS — M3214 Glomerular disease in systemic lupus erythematosus: Secondary | ICD-10-CM | POA: Diagnosis not present

## 2015-12-25 DIAGNOSIS — Z9114 Patient's other noncompliance with medication regimen: Secondary | ICD-10-CM | POA: Diagnosis not present

## 2015-12-25 DIAGNOSIS — Z5181 Encounter for therapeutic drug level monitoring: Secondary | ICD-10-CM | POA: Diagnosis not present

## 2015-12-25 DIAGNOSIS — I6350 Cerebral infarction due to unspecified occlusion or stenosis of unspecified cerebral artery: Secondary | ICD-10-CM

## 2015-12-25 DIAGNOSIS — E119 Type 2 diabetes mellitus without complications: Secondary | ICD-10-CM | POA: Diagnosis not present

## 2015-12-25 DIAGNOSIS — I5032 Chronic diastolic (congestive) heart failure: Secondary | ICD-10-CM | POA: Diagnosis not present

## 2015-12-25 DIAGNOSIS — D6861 Antiphospholipid syndrome: Secondary | ICD-10-CM | POA: Diagnosis not present

## 2015-12-25 DIAGNOSIS — Z8673 Personal history of transient ischemic attack (TIA), and cerebral infarction without residual deficits: Secondary | ICD-10-CM | POA: Diagnosis not present

## 2015-12-25 DIAGNOSIS — Z940 Kidney transplant status: Secondary | ICD-10-CM | POA: Diagnosis not present

## 2015-12-25 DIAGNOSIS — N2889 Other specified disorders of kidney and ureter: Secondary | ICD-10-CM | POA: Diagnosis not present

## 2015-12-25 DIAGNOSIS — N189 Chronic kidney disease, unspecified: Secondary | ICD-10-CM | POA: Diagnosis not present

## 2015-12-25 DIAGNOSIS — I110 Hypertensive heart disease with heart failure: Secondary | ICD-10-CM | POA: Diagnosis not present

## 2015-12-25 DIAGNOSIS — Z7901 Long term (current) use of anticoagulants: Secondary | ICD-10-CM | POA: Diagnosis not present

## 2015-12-25 DIAGNOSIS — Z7952 Long term (current) use of systemic steroids: Secondary | ICD-10-CM | POA: Diagnosis not present

## 2015-12-25 LAB — POCT INR: INR: 1.5

## 2016-01-01 ENCOUNTER — Ambulatory Visit (INDEPENDENT_AMBULATORY_CARE_PROVIDER_SITE_OTHER): Payer: Medicare Other | Admitting: Internal Medicine

## 2016-01-01 DIAGNOSIS — Z86718 Personal history of other venous thrombosis and embolism: Secondary | ICD-10-CM | POA: Diagnosis not present

## 2016-01-01 DIAGNOSIS — M3214 Glomerular disease in systemic lupus erythematosus: Secondary | ICD-10-CM | POA: Diagnosis not present

## 2016-01-01 DIAGNOSIS — N189 Chronic kidney disease, unspecified: Secondary | ICD-10-CM | POA: Diagnosis not present

## 2016-01-01 DIAGNOSIS — Z7952 Long term (current) use of systemic steroids: Secondary | ICD-10-CM | POA: Diagnosis not present

## 2016-01-01 DIAGNOSIS — I6350 Cerebral infarction due to unspecified occlusion or stenosis of unspecified cerebral artery: Secondary | ICD-10-CM

## 2016-01-01 DIAGNOSIS — Z79891 Long term (current) use of opiate analgesic: Secondary | ICD-10-CM | POA: Diagnosis not present

## 2016-01-01 DIAGNOSIS — Z8673 Personal history of transient ischemic attack (TIA), and cerebral infarction without residual deficits: Secondary | ICD-10-CM | POA: Diagnosis not present

## 2016-01-01 DIAGNOSIS — D6861 Antiphospholipid syndrome: Secondary | ICD-10-CM | POA: Diagnosis not present

## 2016-01-01 DIAGNOSIS — Z7901 Long term (current) use of anticoagulants: Secondary | ICD-10-CM | POA: Diagnosis not present

## 2016-01-01 DIAGNOSIS — Z940 Kidney transplant status: Secondary | ICD-10-CM | POA: Diagnosis not present

## 2016-01-01 DIAGNOSIS — I5032 Chronic diastolic (congestive) heart failure: Secondary | ICD-10-CM | POA: Diagnosis not present

## 2016-01-01 DIAGNOSIS — I110 Hypertensive heart disease with heart failure: Secondary | ICD-10-CM | POA: Diagnosis not present

## 2016-01-01 DIAGNOSIS — Z5181 Encounter for therapeutic drug level monitoring: Secondary | ICD-10-CM | POA: Diagnosis not present

## 2016-01-01 DIAGNOSIS — E119 Type 2 diabetes mellitus without complications: Secondary | ICD-10-CM | POA: Diagnosis not present

## 2016-01-01 LAB — POCT INR: INR: 2.2

## 2016-01-03 DIAGNOSIS — R079 Chest pain, unspecified: Secondary | ICD-10-CM | POA: Diagnosis not present

## 2016-01-04 ENCOUNTER — Encounter (HOSPITAL_COMMUNITY): Payer: Self-pay | Admitting: Emergency Medicine

## 2016-01-04 ENCOUNTER — Emergency Department (HOSPITAL_COMMUNITY)
Admission: EM | Admit: 2016-01-04 | Discharge: 2016-01-04 | Disposition: A | Discharge status: Home / Self Care | Payer: Medicare Other | Attending: Emergency Medicine | Admitting: Emergency Medicine

## 2016-01-04 ENCOUNTER — Emergency Department (HOSPITAL_COMMUNITY): Payer: Medicare Other

## 2016-01-04 DIAGNOSIS — R0789 Other chest pain: Secondary | ICD-10-CM | POA: Diagnosis not present

## 2016-01-04 DIAGNOSIS — I110 Hypertensive heart disease with heart failure: Secondary | ICD-10-CM | POA: Insufficient documentation

## 2016-01-04 DIAGNOSIS — Z8673 Personal history of transient ischemic attack (TIA), and cerebral infarction without residual deficits: Secondary | ICD-10-CM | POA: Insufficient documentation

## 2016-01-04 DIAGNOSIS — Z91040 Latex allergy status: Secondary | ICD-10-CM | POA: Insufficient documentation

## 2016-01-04 DIAGNOSIS — I509 Heart failure, unspecified: Secondary | ICD-10-CM | POA: Insufficient documentation

## 2016-01-04 DIAGNOSIS — E119 Type 2 diabetes mellitus without complications: Secondary | ICD-10-CM | POA: Diagnosis not present

## 2016-01-04 DIAGNOSIS — Z7901 Long term (current) use of anticoagulants: Secondary | ICD-10-CM | POA: Insufficient documentation

## 2016-01-04 DIAGNOSIS — R079 Chest pain, unspecified: Secondary | ICD-10-CM | POA: Diagnosis not present

## 2016-01-04 DIAGNOSIS — Z79899 Other long term (current) drug therapy: Secondary | ICD-10-CM | POA: Insufficient documentation

## 2016-01-04 DIAGNOSIS — R05 Cough: Secondary | ICD-10-CM | POA: Diagnosis not present

## 2016-01-04 LAB — PROTIME-INR
INR: 2.11
Prothrombin Time: 24 seconds — ABNORMAL HIGH (ref 11.4–15.2)

## 2016-01-04 LAB — COMPREHENSIVE METABOLIC PANEL
ALT: 17 U/L (ref 14–54)
AST: 20 U/L (ref 15–41)
Albumin: 3.6 g/dL (ref 3.5–5.0)
Alkaline Phosphatase: 59 U/L (ref 38–126)
Anion gap: 6 (ref 5–15)
BUN: 14 mg/dL (ref 6–20)
CO2: 24 mmol/L (ref 22–32)
Calcium: 9.2 mg/dL (ref 8.9–10.3)
Chloride: 107 mmol/L (ref 101–111)
Creatinine, Ser: 0.76 mg/dL (ref 0.44–1.00)
GFR calc Af Amer: >60 mL/min (ref >60)
GFR calc non Af Amer: >60 mL/min (ref >60)
Glucose, Bld: 85 mg/dL (ref 65–99)
Potassium: 3.7 mmol/L (ref 3.5–5.1)
Sodium: 137 mmol/L (ref 135–145)
Total Bilirubin: 0.9 mg/dL (ref 0.3–1.2)
Total Protein: 6.2 g/dL — ABNORMAL LOW (ref 6.5–8.1)

## 2016-01-04 LAB — I-STAT TROPONIN, ED
Troponin i, poc: 0 ng/mL (ref 0.00–0.08)
Troponin i, poc: 0 ng/mL (ref 0.00–0.08)

## 2016-01-04 LAB — URINALYSIS, ROUTINE W REFLEX MICROSCOPIC
Glucose, UA: NEGATIVE mg/dL
Hgb urine dipstick: NEGATIVE
Ketones, ur: 15 mg/dL — AB
Leukocytes, UA: NEGATIVE
Nitrite: NEGATIVE
Protein, ur: NEGATIVE mg/dL
Specific Gravity, Urine: 1.024 (ref 1.005–1.030)
pH: 5.5 (ref 5.0–8.0)

## 2016-01-04 LAB — CBC
HCT: 40.3 % (ref 36.0–46.0)
Hemoglobin: 12.8 g/dL (ref 12.0–15.0)
MCH: 29.2 pg (ref 26.0–34.0)
MCHC: 31.8 g/dL (ref 30.0–36.0)
MCV: 92 fL (ref 78.0–100.0)
Platelets: 208 10*3/uL (ref 150–400)
RBC: 4.38 MIL/uL (ref 3.87–5.11)
RDW: 14.3 % (ref 11.5–15.5)
WBC: 8.2 10*3/uL (ref 4.0–10.5)

## 2016-01-04 LAB — D-DIMER, QUANTITATIVE: D-Dimer, Quant: <0.27 ug/mL-FEU (ref 0.00–0.50)

## 2016-01-04 MED ORDER — HYDROMORPHONE HCL 1 MG/ML IJ SOLN: 0.5000 mg | Freq: Once | INTRAMUSCULAR | Status: DC | PRN

## 2016-01-04 MED ORDER — MORPHINE SULFATE (PF) 4 MG/ML IV SOLN: 4.0000 mg | Freq: Once | INTRAVENOUS | Status: DC

## 2016-01-04 MED ORDER — ONDANSETRON HCL 4 MG/2ML IJ SOLN
4.0000 mg | Freq: Once | INTRAMUSCULAR | Status: AC
  Administered 2016-01-04: 4 mg via INTRAVENOUS
  Filled 2016-01-04: qty 2

## 2016-01-04 MED ORDER — HYDROMORPHONE HCL 1 MG/ML IJ SOLN
1.0000 mg | Freq: Once | INTRAMUSCULAR | Status: AC
  Administered 2016-01-04: 1 mg via INTRAVENOUS
  Filled 2016-01-04: qty 1

## 2016-01-04 MED ORDER — FENTANYL CITRATE (PF) 100 MCG/2ML IJ SOLN
50.0000 ug | Freq: Once | INTRAMUSCULAR | Status: DC
  Filled 2016-01-04: qty 2

## 2016-01-04 MED ORDER — SODIUM CHLORIDE 0.9 % IV BOLUS (SEPSIS)
1000.0000 mL | Freq: Once | INTRAVENOUS | Status: AC
Start: 2016-01-04 — End: 2016-01-04
  Administered 2016-01-04: 1000 mL via INTRAVENOUS

## 2016-01-04 NOTE — ED Provider Notes (Cosign Needed)
Patient received in sign out from [place] at shift change.  See her note for full H&P.  Briefly, 55 y.o. F here with chest pain x 2 days. Pain worse with movement and palpation of chest wall.   Patient had negative cardiac work-up including EKG, CXR, labs including trop x2 and d-dimer. She is on coumadin.     Plan:  Awaiting UA.  If negative, d/c home.    Results for orders placed or performed during the hospital encounter of 01/04/16  CBC  Result Value Ref Range   WBC 8.2 4.0 - 10.5 K/uL   RBC 4.38 3.87 - 5.11 MIL/uL   Hemoglobin 12.8 12.0 - 15.0 g/dL   HCT 40.3 36.0 - 46.0 %   MCV 92.0 78.0 - 100.0 fL   MCH 29.2 26.0 - 34.0 pg   MCHC 31.8 30.0 - 36.0 g/dL   RDW 14.3 11.5 - 15.5 %   Platelets 208 150 - 400 K/uL  Comprehensive metabolic panel  Result Value Ref Range   Sodium 137 135 - 145 mmol/L   Potassium 3.7 3.5 - 5.1 mmol/L   Chloride 107 101 - 111 mmol/L   CO2 24 22 - 32 mmol/L   Glucose, Bld 85 65 - 99 mg/dL   BUN 14 6 - 20 mg/dL   Creatinine, Ser 0.76 0.44 - 1.00 mg/dL   Calcium 9.2 8.9 - 10.3 mg/dL   Total Protein 6.2 (L) 6.5 - 8.1 g/dL   Albumin 3.6 3.5 - 5.0 g/dL   AST 20 15 - 41 U/L   ALT 17 14 - 54 U/L   Alkaline Phosphatase 59 38 - 126 U/L   Total Bilirubin 0.9 0.3 - 1.2 mg/dL   GFR calc non Af Amer >60 >60 mL/min   GFR calc Af Amer >60 >60 mL/min   Anion gap 6 5 - 15  Protime-INR  Result Value Ref Range   Prothrombin Time 24.0 (H) 11.4 - 15.2 seconds   INR 2.11   Urinalysis, Routine w reflex microscopic (not at [facility])  Result Value Ref Range   Color, Urine YELLOW YELLOW   APPearance CLOUDY (A) CLEAR   Specific Gravity, Urine 1.024 1.005 - 1.030   pH 5.5 5.0 - 8.0   Glucose, UA NEGATIVE NEGATIVE mg/dL   Hgb urine dipstick NEGATIVE NEGATIVE   Bilirubin Urine SMALL (A) NEGATIVE   Ketones, ur 15 (A) NEGATIVE mg/dL   Protein, ur NEGATIVE NEGATIVE mg/dL   Nitrite NEGATIVE NEGATIVE   Leukocytes, UA NEGATIVE NEGATIVE  D-dimer, quantitative (not at [facility])   Result Value Ref Range   D-Dimer, Quant <0.27 0.00 - 0.50 ug/mL-FEU  I-stat troponin, ED  Result Value Ref Range   Troponin i, poc 0.00 0.00 - 0.08 ng/mL   Comment 3          I-stat troponin, ED  Result Value Ref Range   Troponin i, poc 0.00 0.00 - 0.08 ng/mL   Comment 3           Dg Chest 2 View  Result Date: 01/04/2016 CLINICAL DATA:  Pt arrived via [facility] from home c/o left sided chest pain worsening over the last 3 days. Pain is generalized and is made worse by movement, deep breathing, and coughing. Pt reports having a cough for 3 days. EXAM: CHEST  2 VIEW COMPARISON:  03/27/2015 FINDINGS: Cardiac silhouette is normal in size. The aorta is uncoiled. No mediastinal or hilar masses or evidence of adenopathy. Mild scarring or subsegmental atelectasis at the  lung bases. Lungs otherwise clear. No pleural effusion or pneumothorax. Skeletal structures are demineralized but grossly intact. IMPRESSION: No acute cardiopulmonary disease. Electronically Signed   By: Lajean Manes M.D.   On: 01/04/2016 01:13   9:42 AM Patient's UA appears non-infectious.  Will d/c home with plan per prior team.  Follow-up with PCP.   Larene Pickett, PA-C 01/04/16 1048

## 2016-01-04 NOTE — ED Notes (Signed)
Pt is in stable condition upon d/c and is escorted from ED via wheelchair. Upon removing IV pt swatted at this RN twice. Pt advised to not continue to do that. Pt upset she is being d/c.

## 2016-01-04 NOTE — ED Triage Notes (Signed)
Pt arrived via [facility] from home c/o chest pain worsening over the last 3 days. Pain is generalized and is made worse by movement, deep breathing, and coughing. Pt reports having a cough for 3 days.

## 2016-01-04 NOTE — Discharge Instructions (Signed)
Your work-up today was reassuring. Follow-up with your primary care doctor. Return here for new concerns.

## 2016-01-04 NOTE — ED Notes (Signed)
IV attempt x 1 unsuccessful. Pt to Xray

## 2016-01-04 NOTE — ED Notes (Signed)
Called pt brother per request.

## 2016-01-04 NOTE — ED Notes (Signed)
Pt refusing to give urine sample, refusing to let us in-out cath. EDP aware and at bedside

## 2016-01-04 NOTE — ED Provider Notes (Signed)
By signing my name below, I, Altamease Oiler, attest that this documentation has been prepared under the direction and in the presence of Arael Piccione PA-C. Electronically Signed: Altamease Oiler, ED Scribe. 01/04/16. 1:32 AM.  TIME SEEN: 1:16 AM  CHIEF COMPLAINT: chest pain  HPI: Connie Ruiz is a 55 y.o. female with PMHx of PE, lupus, HTN, DM, HLD, CVA, and kidney transplant who presents to the Emergency Department complaining of new, sharp, 10/10 in severity, left sided chest pain with gradual onset 2 days ago. The pain radiates across the chest and is exacerbated by movement and palpation. Associated symptoms include cough. Pt denies dysuria, hematuria, malodorous urine, difficulty urinating, and LE swelling.   ROS: See HPI Constitutional: no fever  Eyes: no drainage  ENT: no runny nose   Cardiovascular: chest pain  Resp: no SOB  GI: no vomiting GU: no dysuria Integumentary: no rash  Allergy: no hives  Musculoskeletal: no leg swelling  Neurological: no slurred speech ROS otherwise negative  PAST MEDICAL HISTORY/PAST SURGICAL HISTORY:  Past Medical History:  Diagnosis Date  . Candida esophagitis ([place]) 11/12/2014  . CEREBROVASCULAR ACCIDENT, ACUTE 04/15/2010  . CLOSTRIDIUM DIFFICILE COLITIS, HX OF 08/21/2007  . CONGESTIVE HEART FAILURE 08/21/2007  . Current use of long term anticoagulation    Dr. Andree Elk, [facility]  . CVA 04/17/2010  . Depression    Dr. Andree Elk, [place], TYPE II 08/21/2007  . DVT, HX OF 08/21/2007  . GERD 08/21/2007  . GOUT 08/21/2007  . History of stroke with residual effects   . HYPERLIPIDEMIA 08/21/2007  . HYPERTENSION 08/21/2007   Dr. Andree Elk, [place], HX OF 08/22/2007   s/p renal transplant-Dr. Andree Elk, [place] 08/21/2007  . OSTEOPOROSIS 08/21/2007   Rheumatol at [facility]  . Pulmonary embolism ([place]) 07/16/2010  . Renal failure   . RENAL INSUFFICIENCY 08/21/2007  . Right sided weakness   . Steroid-induced  hyperglycemia 11/09/2014  . Tachycardia   . THYROID NODULE, LEFT 04/10/2009    MEDICATIONS:  Prior to Admission medications   Medication Sig Start Date End Date Taking? Authorizing Provider  alendronate (FOSAMAX) 70 MG tablet Take 70 mg by mouth once a week. Take on Saturdays with a full glass of water on an empty stomach.    Historical Provider, MD  ammonium lactate (AMLACTIN) 12 % cream Apply 1 g topically 2 (two) times daily as needed for dry skin.     Historical Provider, MD  atorvastatin (LIPITOR) 40 MG tablet Take 1 tablet (40 mg total) by mouth daily. 10/29/15   Renato Shin, MD  Blood Glucose Monitoring Suppl (ONETOUCH VERIO IQ SYSTEM) W/DEVICE KIT Use to check blood sugar 1 time per day 02/12/15   Renato Shin, MD  calcitRIOL (ROCALTROL) 0.25 MCG capsule TAKE ONE CAPSULE BY MOUTH DAILY 02/11/15   Renato Shin, MD  diltiazem (TIAZAC) 240 MG 24 hr capsule Take 1 capsule (240 mg total) by mouth daily. 10/29/15   Renato Shin, MD  donepezil (ARICEPT) 10 MG tablet Take 1 tablet (10 mg total) by mouth at bedtime. 05/08/15   Pieter Partridge, DO  esomeprazole (NEXIUM) 20 MG capsule Take 20 mg by mouth daily. 03/21/15   Historical Provider, MD  glucose blood (ONE TOUCH ULTRA TEST) test strip Use to check blood sugar 1 time per day 02/12/15   Renato Shin, MD  HYDROcodone-acetaminophen (NORCO/VICODIN) 5-325 MG tablet Take 1-2 tablets by mouth every 4 (four) hours as needed for moderate pain. 06/15/15  Merrily Pew, MD  losartan (COZAAR) 100 MG tablet Take 1 tablet (100 mg total) by mouth daily. 10/29/15   Renato Shin, MD  mirtazapine (REMERON) 30 MG tablet Take 1 tablet (30 mg total) by mouth at bedtime. 07/28/15   Pieter Partridge, DO  mycophenolate (CELLCEPT) 250 MG capsule Take 250 mg by mouth 2 (two) times daily.    Historical Provider, MD  nystatin (MYCOSTATIN) powder Apply 1 g topically 4 (four) times daily as needed. For yeast under breast    Historical Provider, MD  ondansetron (ZOFRAN ODT) 4 MG  disintegrating tablet 21m ODT q4 hours prn nausea/vomiting 06/15/15   JMerrily Pew MD  OOperating Room ServicesDELICA LANCETS [id number] Use to check blood sugar 1 time per day 02/12/15   SRenato Shin MD  oxybutynin (DITROPAN) 5 MG tablet Take 5 mg by mouth daily as needed for bladder spasms.     Historical Provider, MD  QUEtiapine (SEROQUEL) 25 MG tablet Take 1 tablet (25 mg total) by mouth at bedtime. 07/28/15   APieter Partridge DO  sertraline (ZOLOFT) 100 MG tablet Take 0.5 tablets (50 mg total) by mouth daily. 11/24/15   SRenato Shin MD  tacrolimus (PROGRAF) 1 MG capsule Take 1 mg by mouth 2 (two) times daily.  04/16/14   Historical Provider, MD  traMADol (ULTRAM) 50 MG tablet Take 1 tablet (50 mg total) by mouth every 8 (eight) hours as needed. 07/07/15   SRenato Shin MD  triamcinolone (KENALOG) 0.025 % cream Apply 1 application topically daily as needed (dry skin).     Historical Provider, MD  warfarin (COUMADIN) 5 MG tablet Take 1 to 2 tablets by mouth daily as directed by coumadin clinic 11/14/15   JLorretta Harp MD    ALLERGIES:  Allergies  Allergen Reactions  . Oxycodone-Acetaminophen Shortness Of Breath and Nausea Only  . Propoxyphene N-Acetaminophen Shortness Of Breath and Nausea Only  . Sulfonamide Derivatives Shortness Of Breath and Nausea Only  . Adhesive [Tape]   . Codeine Nausea Only  . Other Other (See Comments)    No blood, Jehovaeh Witness   . Gabapentin Anxiety    twitching  . Latex Rash  . Metoprolol Rash  . Morphine And Related Rash    IV site on arm is red, patient reports this is improving.  NO shortness of breath reported.    SOCIAL HISTORY:  Social History  Substance Use Topics  . Smoking status: Never Smoker  . Smokeless tobacco: Never Used  . Alcohol use No    FAMILY HISTORY: Family History  Problem Relation Age of Onset  . Heart attack Mother   . Heart disease Father   . Asthma Sister   . Asthma Sister   . Asthma Daughter   . Cancer Maternal Grandfather      prostate  . Cancer Paternal Grandfather     colon    EXAM: BP 139/86 (BP Location: Left Arm)   Pulse 80   Temp 98.4 F (36.9 C) (Oral)   Ht _0  (1.626 m)   Wt 145 lb (65.8 kg)   SpO2 99%   BMI 24.89 kg/m  CONSTITUTIONAL: Alert and oriented and responds appropriately to questions. Well-appearing; well-nourished HEAD: Normocephalic EYES: Conjunctivae clear, PERRL ENT: normal nose; no rhinorrhea; moist mucous membranes NECK: Supple, no meningismus, no LAD  CARD: RRR; no murmurs, no clicks, no rubs, no gallops RESP: No  tachypnea; breath sounds clear and equal bilaterally; no wheezes, no rhonchi, no rales, no hypoxia or respiratory  distress, speaking full sentences  + pain to left chest wall and left flank to palpation ABD/GI: Normal bowel sounds; non-distended; soft, non-tender, no rebound, no guarding, no peritoneal signs BACK:  The back appears normal and is non-tender to palpation, there is no CVA tenderness EXT: Normal ROM in all joints; non-tender to palpation; no edema; normal capillary refill; no cyanosis, no calf tenderness or swelling    SKIN: Normal color for age and race; warm; no rash NEURO: Moves all extremities equally, sensation to light touch intact diffusely, cranial nerves II through XII intact PSYCH: The patient's mood and manner are appropriate. Grooming and personal hygiene are appropriate.  MEDICAL DECISION MAKING: Case discussed with Dr. Leonides Schanz. The patient's pain very pleuritic with producible pain to palpation and range of motion of the arm. We considered blood clot in the lungs however the patient is therapeutic on her Coumadin, is not tachycardic, normal oxygen saturation taking it very unlikely. She has had 2 negative troponins and an unremarkable EKG and chest x-ray.   ED PROGRESS:  5:00 am Urine is pending, if normal, will discharge patient with pain medication and her follow-up with her PCP and cardiologist. On evaluation the patient Is saying her chest  really hurts and that she is feeling sick. Discussed case with Dr. Leonides Schanz and she recommended d-dimer. The d-dimer is negative. The patient still has not urinated but she is refusing to be catheterized. Dr. Leonides Schanz recommends given the patient a liter. If her urine comes back normal then the patient can be discharged with pain medication and reassurance that her pain is due to chest wall inflammation as her pain is easily reproducible.  At end of shift patient signed out to Quincy Carnes, [place] - C. Waiting for fluids bolus and urine.  I personally performed the services described in this documentation, which was scribed in my presence. The recorded information has been reviewed and is accurate.      Delos Haring, PA-C 01/04/16 [place], DO 01/04/16 [phone]

## 2016-01-06 ENCOUNTER — Telehealth: Payer: Self-pay | Admitting: *Deleted

## 2016-01-06 NOTE — Telephone Encounter (Signed)
[facility] for Coumadin orders, signed by Dr Gwenlyn Found on 01/06/16 that were  Dated for 01/01/2016 and 12/25/15.

## 2016-01-08 ENCOUNTER — Ambulatory Visit (INDEPENDENT_AMBULATORY_CARE_PROVIDER_SITE_OTHER): Payer: Medicare Other | Admitting: Pharmacist

## 2016-01-08 DIAGNOSIS — I5032 Chronic diastolic (congestive) heart failure: Secondary | ICD-10-CM | POA: Diagnosis not present

## 2016-01-08 DIAGNOSIS — D6861 Antiphospholipid syndrome: Secondary | ICD-10-CM | POA: Diagnosis not present

## 2016-01-08 DIAGNOSIS — Z86718 Personal history of other venous thrombosis and embolism: Secondary | ICD-10-CM | POA: Diagnosis not present

## 2016-01-08 DIAGNOSIS — Z79891 Long term (current) use of opiate analgesic: Secondary | ICD-10-CM | POA: Diagnosis not present

## 2016-01-08 DIAGNOSIS — M3214 Glomerular disease in systemic lupus erythematosus: Secondary | ICD-10-CM | POA: Diagnosis not present

## 2016-01-08 DIAGNOSIS — I6350 Cerebral infarction due to unspecified occlusion or stenosis of unspecified cerebral artery: Secondary | ICD-10-CM

## 2016-01-08 DIAGNOSIS — Z8673 Personal history of transient ischemic attack (TIA), and cerebral infarction without residual deficits: Secondary | ICD-10-CM | POA: Diagnosis not present

## 2016-01-08 DIAGNOSIS — N189 Chronic kidney disease, unspecified: Secondary | ICD-10-CM | POA: Diagnosis not present

## 2016-01-08 DIAGNOSIS — Z5181 Encounter for therapeutic drug level monitoring: Secondary | ICD-10-CM | POA: Diagnosis not present

## 2016-01-08 DIAGNOSIS — Z7901 Long term (current) use of anticoagulants: Secondary | ICD-10-CM | POA: Diagnosis not present

## 2016-01-08 DIAGNOSIS — I110 Hypertensive heart disease with heart failure: Secondary | ICD-10-CM | POA: Diagnosis not present

## 2016-01-08 DIAGNOSIS — Z940 Kidney transplant status: Secondary | ICD-10-CM | POA: Diagnosis not present

## 2016-01-08 DIAGNOSIS — Z7952 Long term (current) use of systemic steroids: Secondary | ICD-10-CM | POA: Diagnosis not present

## 2016-01-08 DIAGNOSIS — E119 Type 2 diabetes mellitus without complications: Secondary | ICD-10-CM | POA: Diagnosis not present

## 2016-01-08 LAB — POCT INR: INR: 1.5

## 2016-01-15 ENCOUNTER — Ambulatory Visit (INDEPENDENT_AMBULATORY_CARE_PROVIDER_SITE_OTHER): Payer: Medicare Other | Admitting: Cardiology

## 2016-01-15 DIAGNOSIS — N189 Chronic kidney disease, unspecified: Secondary | ICD-10-CM | POA: Diagnosis not present

## 2016-01-15 DIAGNOSIS — I5032 Chronic diastolic (congestive) heart failure: Secondary | ICD-10-CM | POA: Diagnosis not present

## 2016-01-15 DIAGNOSIS — D6861 Antiphospholipid syndrome: Secondary | ICD-10-CM | POA: Diagnosis not present

## 2016-01-15 DIAGNOSIS — Z7952 Long term (current) use of systemic steroids: Secondary | ICD-10-CM | POA: Diagnosis not present

## 2016-01-15 DIAGNOSIS — Z86718 Personal history of other venous thrombosis and embolism: Secondary | ICD-10-CM | POA: Diagnosis not present

## 2016-01-15 DIAGNOSIS — M3214 Glomerular disease in systemic lupus erythematosus: Secondary | ICD-10-CM | POA: Diagnosis not present

## 2016-01-15 DIAGNOSIS — Z8673 Personal history of transient ischemic attack (TIA), and cerebral infarction without residual deficits: Secondary | ICD-10-CM | POA: Diagnosis not present

## 2016-01-15 DIAGNOSIS — Z5181 Encounter for therapeutic drug level monitoring: Secondary | ICD-10-CM | POA: Diagnosis not present

## 2016-01-15 DIAGNOSIS — Z940 Kidney transplant status: Secondary | ICD-10-CM | POA: Diagnosis not present

## 2016-01-15 DIAGNOSIS — Z79891 Long term (current) use of opiate analgesic: Secondary | ICD-10-CM | POA: Diagnosis not present

## 2016-01-15 DIAGNOSIS — Z7901 Long term (current) use of anticoagulants: Secondary | ICD-10-CM | POA: Diagnosis not present

## 2016-01-15 DIAGNOSIS — I110 Hypertensive heart disease with heart failure: Secondary | ICD-10-CM | POA: Diagnosis not present

## 2016-01-15 DIAGNOSIS — E119 Type 2 diabetes mellitus without complications: Secondary | ICD-10-CM | POA: Diagnosis not present

## 2016-01-15 DIAGNOSIS — I6350 Cerebral infarction due to unspecified occlusion or stenosis of unspecified cerebral artery: Secondary | ICD-10-CM

## 2016-01-15 LAB — POCT INR: INR: 2.9

## 2016-01-20 ENCOUNTER — Telehealth: Payer: Self-pay | Admitting: *Deleted

## 2016-01-20 NOTE — Telephone Encounter (Signed)
[facility] orders for 01/08/16 and 01/15/16 signed by Dr Gwenlyn Found and faxed to number provided.

## 2016-01-22 ENCOUNTER — Ambulatory Visit (INDEPENDENT_AMBULATORY_CARE_PROVIDER_SITE_OTHER): Payer: Medicare Other

## 2016-01-22 ENCOUNTER — Telehealth: Payer: Self-pay | Admitting: *Deleted

## 2016-01-22 DIAGNOSIS — N189 Chronic kidney disease, unspecified: Secondary | ICD-10-CM | POA: Diagnosis not present

## 2016-01-22 DIAGNOSIS — M3214 Glomerular disease in systemic lupus erythematosus: Secondary | ICD-10-CM | POA: Diagnosis not present

## 2016-01-22 DIAGNOSIS — Z5181 Encounter for therapeutic drug level monitoring: Secondary | ICD-10-CM | POA: Diagnosis not present

## 2016-01-22 DIAGNOSIS — Z79891 Long term (current) use of opiate analgesic: Secondary | ICD-10-CM | POA: Diagnosis not present

## 2016-01-22 DIAGNOSIS — Z8673 Personal history of transient ischemic attack (TIA), and cerebral infarction without residual deficits: Secondary | ICD-10-CM | POA: Diagnosis not present

## 2016-01-22 DIAGNOSIS — I6350 Cerebral infarction due to unspecified occlusion or stenosis of unspecified cerebral artery: Secondary | ICD-10-CM

## 2016-01-22 DIAGNOSIS — E119 Type 2 diabetes mellitus without complications: Secondary | ICD-10-CM | POA: Diagnosis not present

## 2016-01-22 DIAGNOSIS — Z86718 Personal history of other venous thrombosis and embolism: Secondary | ICD-10-CM | POA: Diagnosis not present

## 2016-01-22 DIAGNOSIS — I110 Hypertensive heart disease with heart failure: Secondary | ICD-10-CM | POA: Diagnosis not present

## 2016-01-22 DIAGNOSIS — Z940 Kidney transplant status: Secondary | ICD-10-CM | POA: Diagnosis not present

## 2016-01-22 DIAGNOSIS — Z7901 Long term (current) use of anticoagulants: Secondary | ICD-10-CM | POA: Diagnosis not present

## 2016-01-22 DIAGNOSIS — D6861 Antiphospholipid syndrome: Secondary | ICD-10-CM | POA: Diagnosis not present

## 2016-01-22 DIAGNOSIS — Z7952 Long term (current) use of systemic steroids: Secondary | ICD-10-CM | POA: Diagnosis not present

## 2016-01-22 DIAGNOSIS — I5032 Chronic diastolic (congestive) heart failure: Secondary | ICD-10-CM | POA: Diagnosis not present

## 2016-01-22 LAB — POCT INR: INR: 3.4

## 2016-01-22 NOTE — Telephone Encounter (Signed)
Home Health Certification Plan of Care from period 12/27/2015 to 02/24/2016 signed by Dr Gwenlyn Found and faxed to [facility].

## 2016-01-28 ENCOUNTER — Ambulatory Visit (INDEPENDENT_AMBULATORY_CARE_PROVIDER_SITE_OTHER): Payer: Medicare Other | Admitting: Endocrinology

## 2016-01-28 VITALS — BP 116/80 | HR 81 | Ht 64.0 in

## 2016-01-28 DIAGNOSIS — N186 End stage renal disease: Secondary | ICD-10-CM

## 2016-01-28 DIAGNOSIS — E876 Hypokalemia: Secondary | ICD-10-CM

## 2016-01-28 DIAGNOSIS — E1122 Type 2 diabetes mellitus with diabetic chronic kidney disease: Secondary | ICD-10-CM

## 2016-01-28 DIAGNOSIS — Z119 Encounter for screening for infectious and parasitic diseases, unspecified: Secondary | ICD-10-CM | POA: Diagnosis not present

## 2016-01-28 DIAGNOSIS — Z992 Dependence on renal dialysis: Secondary | ICD-10-CM

## 2016-01-28 LAB — POCT GLYCOSYLATED HEMOGLOBIN (HGB A1C): Hemoglobin A1C: 6.4

## 2016-01-28 LAB — BASIC METABOLIC PANEL
BUN: 15 mg/dL (ref 6–23)
CO2: 27 mEq/L (ref 19–32)
Calcium: 9.2 mg/dL (ref 8.4–10.5)
Chloride: 105 mEq/L (ref 96–112)
Creatinine, Ser: 0.75 mg/dL (ref 0.40–1.20)
GFR: 103.06 mL/min (ref >60.00)
Glucose, Bld: 97 mg/dL (ref 70–99)
Potassium: 3.5 mEq/L (ref 3.5–5.1)
Sodium: 138 mEq/L (ref 135–145)

## 2016-01-28 LAB — HIV ANTIBODY (ROUTINE TESTING W REFLEX): HIV 1&2 Ab, 4th Generation: NONREACTIVE

## 2016-01-28 NOTE — Progress Notes (Signed)
Subjective:    Patient ID: Connie Ruiz, female    DOB: 1960/07/28, 55 y.o.   MRN: [id number]  HPI Pt returns for f/u of diabetes mellitus: DM type: 2 Dx'ed: approx 3267 Complications: ESRD (transplant) and CVA.   Therapy: no medication now.   GDM: once, in 1983 DKA: never Severe hypoglycemia: never.   Pancreatitis: once, in the 1980's.  Other: she took insulin for a few months, many years ago.   Interval history: pt states she feels well in general.   Past Medical History:  Diagnosis Date  . Candida esophagitis ([place]) 11/12/2014  . CEREBROVASCULAR ACCIDENT, ACUTE 04/15/2010  . CLOSTRIDIUM DIFFICILE COLITIS, HX OF 08/21/2007  . CONGESTIVE HEART FAILURE 08/21/2007  . Current use of long term anticoagulation    Dr. Andree Elk, [facility]  . CVA 04/17/2010  . Depression    Dr. Andree Elk, [place], TYPE II 08/21/2007  . DVT, HX OF 08/21/2007  . GERD 08/21/2007  . GOUT 08/21/2007  . History of stroke with residual effects   . HYPERLIPIDEMIA 08/21/2007  . HYPERTENSION 08/21/2007   Dr. Andree Elk, [place], HX OF 08/22/2007   s/p renal transplant-Dr. Andree Elk, [place] 08/21/2007  . OSTEOPOROSIS 08/21/2007   [facility] at [facility]  . Pulmonary embolism ([place]) 07/16/2010  . Renal failure   . RENAL INSUFFICIENCY 08/21/2007  . Right sided weakness   . Steroid-induced hyperglycemia 11/09/2014  . Tachycardia   . THYROID NODULE, LEFT 04/10/2009    Past Surgical History:  Procedure Laterality Date  . CHOLECYSTECTOMY    . ENTEROSCOPY N/A 11/11/2014   Procedure: ENTEROSCOPY;  Surgeon: Ladene Artist, MD;  Location: [facility];  Service: Endoscopy;  Laterality: N/A;  . KIDNEY TRANSPLANT Right 2009  . RENAL BIOPSY, OPEN  1981  . TUBAL LIGATION      Social History   Social History  . Marital status: Divorced    Spouse name: N/A  . Number of children: 1  . Years of education: N/A   Occupational History  . DISABILITY Unemployed   Social History Main Topics   . Smoking status: Never Smoker  . Smokeless tobacco: Never Used  . Alcohol use No  . Drug use: No  . Sexual activity: No   Other Topics Concern  . Not on file   Social History Narrative   Retired   Regular exercise-no   Pt completed hs.     Current Outpatient Prescriptions on File Prior to Visit  Medication Sig Dispense Refill  . alendronate (FOSAMAX) 70 MG tablet Take 70 mg by mouth once a week. Take on Saturdays with a full glass of water on an empty stomach.    Marland Kitchen ammonium lactate (AMLACTIN) 12 % cream Apply 1 g topically 2 (two) times daily as needed for dry skin.     Marland Kitchen atorvastatin (LIPITOR) 40 MG tablet Take 1 tablet (40 mg total) by mouth daily. 30 tablet 5  . Blood Glucose Monitoring Suppl (ONETOUCH VERIO IQ SYSTEM) W/DEVICE KIT Use to check blood sugar 1 time per day 1 kit 0  . calcitRIOL (ROCALTROL) 0.25 MCG capsule TAKE ONE CAPSULE BY MOUTH DAILY 30 capsule 0  . diltiazem (TIAZAC) 240 MG 24 hr capsule Take 1 capsule (240 mg total) by mouth daily. 30 capsule 11  . donepezil (ARICEPT) 10 MG tablet Take 1 tablet (10 mg total) by mouth at bedtime. 30 tablet 8  . esomeprazole (NEXIUM) 20 MG capsule Take 20 mg by mouth  daily.    . glucose blood (ONE TOUCH ULTRA TEST) test strip Use to check blood sugar 1 time per day 100 each 2  . losartan (COZAAR) 100 MG tablet Take 1 tablet (100 mg total) by mouth daily. 30 tablet 5  . mirtazapine (REMERON) 30 MG tablet Take 1 tablet (30 mg total) by mouth at bedtime. 30 tablet 1  . mycophenolate (CELLCEPT) 250 MG capsule Take 250 mg by mouth 2 (two) times daily.    Marland Kitchen nystatin (MYCOSTATIN) powder Apply 1 g topically 4 (four) times daily as needed. For yeast under breast    . ondansetron (ZOFRAN ODT) 4 MG disintegrating tablet 4m ODT q4 hours prn nausea/vomiting 15 tablet 0  . ONETOUCH DELICA LANCETS 316XMISC Use to check blood sugar 1 time per day 100 each 2  . oxybutynin (DITROPAN) 5 MG tablet Take 5 mg by mouth daily as needed for bladder  spasms.     .Marland KitchenQUEtiapine (SEROQUEL) 25 MG tablet Take 1 tablet (25 mg total) by mouth at bedtime. 30 tablet 1  . sertraline (ZOLOFT) 100 MG tablet Take 0.5 tablets (50 mg total) by mouth daily. 15 tablet 2  . tacrolimus (PROGRAF) 1 MG capsule Take 1 mg by mouth 2 (two) times daily.     . traMADol (ULTRAM) 50 MG tablet Take 1 tablet (50 mg total) by mouth every 8 (eight) hours as needed. 30 tablet 0  . triamcinolone (KENALOG) 0.025 % cream Apply 1 application topically daily as needed (dry skin).     .Marland Kitchenwarfarin (COUMADIN) 5 MG tablet Take 1 to 2 tablets by mouth daily as directed by coumadin clinic 30 tablet 0  . warfarin (COUMADIN) 5 MG tablet Take 2.5-5 mg by mouth See admin instructions. Take 1 tablet on Monday, Wednesday and Friday then Take 1/2 tablet all there other days     No current facility-administered medications on file prior to visit.     Allergies  Allergen Reactions  . Oxycodone-Acetaminophen Shortness Of Breath and Nausea Only  . Propoxyphene N-Acetaminophen Shortness Of Breath and Nausea Only  . Sulfonamide Derivatives Shortness Of Breath and Nausea Only  . Adhesive [Tape]   . Codeine Nausea Only  . Other Other (See Comments)    No blood, Jehovaeh Witness   . Gabapentin Anxiety    twitching  . Latex Rash  . Metoprolol Rash  . Morphine And Related Rash    IV site on arm is red, patient reports this is improving.  NO shortness of breath reported.    Family History  Problem Relation Age of Onset  . Heart attack Mother   . Heart disease Father   . Asthma Sister   . Asthma Sister   . Asthma Daughter   . Cancer Maternal Grandfather     prostate  . Cancer Paternal Grandfather     colon    BP 116/80   Pulse 81   Ht '5\' 4"'  (1.626 m)   SpO2 97%      Review of Systems Denies weight change    Objective:   Physical Exam VITAL SIGNS:  See vs page GENERAL: no distress EXTEMITIES: no deformity.  no ulcer on the feet.  feet are of normal color and temp.  Trace  bilat leg edema. There is bilateral onychomycosis of the toenails.  PULSES: dorsalis pedis intact bilat.   NEURO:  sensation is intact to touch on the feet.   SKIN:  Normal texture and temperature.  No rash or suspicious  lesion is visible.  Patchy hyperpigmentation is noted on the feet.   A1c=6.4%    Assessment & Plan:  Type 2 DM: no medication is needed now.  CKD: stable.   Subjective:   Patient here for Medicare annual wellness visit and management of other chronic and acute problems.     Risk factors: debilitating med problems   Roster of Physicians Providing Medical Care to Patient:  See "snapshot"   Activities of Daily Living: In your present state of health, do you have any difficulty performing the following activities?:  Preparing food and eating?: No  Bathing yourself: No  Getting dressed: No  Using the toilet:No  Moving around from place to place: No  In the past year have you fallen or had a near fall?:No    Home Safety: Has smoke detector and wears seat belts. No firearms. No excess sun exposure.  Diet and Exercise  Current exercise habits: limited by health problems Dietary issues discussed: pt reports a healthy diet.   Depression Screen  Q1: Over the past two weeks, have you felt down, depressed or hopeless? no  Q2: Over the past two weeks, have you felt little interest or pleasure in doing things? no   The following portions of the patient's history were reviewed and updated as appropriate: allergies, current medications, past family history, past medical history, past social history, past surgical history and problem list.   Review of Systems  Denies hearing loss, and visual loss.  Objective:   Vision:  Hearing: grossly normal.  Body mass index:  See vs page Msk: pt slowly performs "get-up-and-go" from a sitting position.  Cognitive Impairment Assessment: cognition, memory and judgment appear normal.  remembers 2/3 at 5 minutes.  excellent recall.  can  easily read and write a sentence.  alert and oriented x 3.    Assessment:   Medicare wellness utd on preventive parameters    Plan:   During the course of the visit the patient was educated and counseled about appropriate screening and preventive services including:       Fall prevention   Screening mammography  Bone densitometry screening  Diabetes screening  Nutrition counseling   Vaccines / LABS Zostavax / Pneumococcal Vaccine  today    Patient Instructions (the written plan) was given to the patient.

## 2016-01-28 NOTE — Progress Notes (Signed)
Vision Test Left eye: 23/30 Right eye: 20/40

## 2016-01-28 NOTE — Progress Notes (Signed)
we discussed code status.  pt requests full code, but would not want to be started or maintained on artificial life-support measures if there was not a reasonable chance of recovery 

## 2016-01-28 NOTE — Patient Instructions (Addendum)
blood tests are requested for you today.  We'll let you know about the results.  good diet and exercise significantly improve the control of your diabetes.  please let me know if you wish to be referred to a dietician.  high blood sugar is very risky to your health.  you should see an eye doctor and dentist every year.  It is very important to get all recommended vaccinations.  Please consider these measures for your health:  minimize alcohol.  Do not use tobacco products.  Have a colonoscopy at least every 10 years from age 30.  Women should have an annual mammogram from age 66.  Keep firearms safely stored.  Always use seat belts.  have working smoke alarms in your home.  See an eye doctor and dentist regularly.  Never drive under the influence of alcohol or drugs (including prescription drugs).   It is critically important to prevent falling down (keep floor areas well-lit, dry, and free of loose objects.  If you have a cane, walker, or wheelchair, you should use it, even for short trips around the house.  Wear flat-soled shoes.  Also, try not to rush).

## 2016-01-29 ENCOUNTER — Telehealth: Payer: Self-pay | Admitting: *Deleted

## 2016-01-29 ENCOUNTER — Telehealth: Payer: Self-pay

## 2016-01-29 DIAGNOSIS — Z79891 Long term (current) use of opiate analgesic: Secondary | ICD-10-CM | POA: Diagnosis not present

## 2016-01-29 DIAGNOSIS — E119 Type 2 diabetes mellitus without complications: Secondary | ICD-10-CM | POA: Diagnosis not present

## 2016-01-29 DIAGNOSIS — Z940 Kidney transplant status: Secondary | ICD-10-CM | POA: Diagnosis not present

## 2016-01-29 DIAGNOSIS — I110 Hypertensive heart disease with heart failure: Secondary | ICD-10-CM | POA: Diagnosis not present

## 2016-01-29 DIAGNOSIS — M3214 Glomerular disease in systemic lupus erythematosus: Secondary | ICD-10-CM | POA: Diagnosis not present

## 2016-01-29 DIAGNOSIS — I5032 Chronic diastolic (congestive) heart failure: Secondary | ICD-10-CM | POA: Diagnosis not present

## 2016-01-29 DIAGNOSIS — N189 Chronic kidney disease, unspecified: Secondary | ICD-10-CM | POA: Diagnosis not present

## 2016-01-29 DIAGNOSIS — Z5181 Encounter for therapeutic drug level monitoring: Secondary | ICD-10-CM | POA: Diagnosis not present

## 2016-01-29 DIAGNOSIS — D6861 Antiphospholipid syndrome: Secondary | ICD-10-CM | POA: Diagnosis not present

## 2016-01-29 DIAGNOSIS — Z7952 Long term (current) use of systemic steroids: Secondary | ICD-10-CM | POA: Diagnosis not present

## 2016-01-29 DIAGNOSIS — Z7901 Long term (current) use of anticoagulants: Secondary | ICD-10-CM | POA: Diagnosis not present

## 2016-01-29 DIAGNOSIS — Z8673 Personal history of transient ischemic attack (TIA), and cerebral infarction without residual deficits: Secondary | ICD-10-CM | POA: Diagnosis not present

## 2016-01-29 DIAGNOSIS — Z86718 Personal history of other venous thrombosis and embolism: Secondary | ICD-10-CM | POA: Diagnosis not present

## 2016-01-29 LAB — HEPATITIS C ANTIBODY: HCV Ab: NEGATIVE

## 2016-01-29 NOTE — Telephone Encounter (Signed)
Called patient and gave lab results. Patient had no questions or concerns.  

## 2016-01-29 NOTE — Telephone Encounter (Signed)
Coumadin Order dated 01/22/16 , signed by Dr Gwenlyn Found and faxed to [facility].

## 2016-02-02 ENCOUNTER — Ambulatory Visit (INDEPENDENT_AMBULATORY_CARE_PROVIDER_SITE_OTHER): Payer: Medicare Other | Admitting: Cardiology

## 2016-02-02 ENCOUNTER — Other Ambulatory Visit: Payer: Self-pay | Admitting: Neurology

## 2016-02-02 DIAGNOSIS — I5032 Chronic diastolic (congestive) heart failure: Secondary | ICD-10-CM | POA: Diagnosis not present

## 2016-02-02 DIAGNOSIS — Z8673 Personal history of transient ischemic attack (TIA), and cerebral infarction without residual deficits: Secondary | ICD-10-CM | POA: Diagnosis not present

## 2016-02-02 DIAGNOSIS — Z940 Kidney transplant status: Secondary | ICD-10-CM | POA: Diagnosis not present

## 2016-02-02 DIAGNOSIS — M3214 Glomerular disease in systemic lupus erythematosus: Secondary | ICD-10-CM | POA: Diagnosis not present

## 2016-02-02 DIAGNOSIS — Z7901 Long term (current) use of anticoagulants: Secondary | ICD-10-CM | POA: Diagnosis not present

## 2016-02-02 DIAGNOSIS — E119 Type 2 diabetes mellitus without complications: Secondary | ICD-10-CM | POA: Diagnosis not present

## 2016-02-02 DIAGNOSIS — Z86718 Personal history of other venous thrombosis and embolism: Secondary | ICD-10-CM | POA: Diagnosis not present

## 2016-02-02 DIAGNOSIS — I110 Hypertensive heart disease with heart failure: Secondary | ICD-10-CM | POA: Diagnosis not present

## 2016-02-02 DIAGNOSIS — N189 Chronic kidney disease, unspecified: Secondary | ICD-10-CM | POA: Diagnosis not present

## 2016-02-02 DIAGNOSIS — Z79891 Long term (current) use of opiate analgesic: Secondary | ICD-10-CM | POA: Diagnosis not present

## 2016-02-02 DIAGNOSIS — Z7952 Long term (current) use of systemic steroids: Secondary | ICD-10-CM | POA: Diagnosis not present

## 2016-02-02 DIAGNOSIS — Z5181 Encounter for therapeutic drug level monitoring: Secondary | ICD-10-CM | POA: Diagnosis not present

## 2016-02-02 DIAGNOSIS — D6861 Antiphospholipid syndrome: Secondary | ICD-10-CM | POA: Diagnosis not present

## 2016-02-02 DIAGNOSIS — I6350 Cerebral infarction due to unspecified occlusion or stenosis of unspecified cerebral artery: Secondary | ICD-10-CM

## 2016-02-02 LAB — POCT INR: INR: 2

## 2016-02-05 DIAGNOSIS — Z79899 Other long term (current) drug therapy: Secondary | ICD-10-CM | POA: Diagnosis not present

## 2016-02-05 DIAGNOSIS — N186 End stage renal disease: Secondary | ICD-10-CM | POA: Diagnosis not present

## 2016-02-05 DIAGNOSIS — M329 Systemic lupus erythematosus, unspecified: Secondary | ICD-10-CM | POA: Diagnosis not present

## 2016-02-05 DIAGNOSIS — Z8673 Personal history of transient ischemic attack (TIA), and cerebral infarction without residual deficits: Secondary | ICD-10-CM | POA: Diagnosis not present

## 2016-02-05 DIAGNOSIS — Z4822 Encounter for aftercare following kidney transplant: Secondary | ICD-10-CM | POA: Diagnosis not present

## 2016-02-05 DIAGNOSIS — Z7901 Long term (current) use of anticoagulants: Secondary | ICD-10-CM | POA: Diagnosis not present

## 2016-02-05 DIAGNOSIS — R197 Diarrhea, unspecified: Secondary | ICD-10-CM | POA: Diagnosis not present

## 2016-02-05 DIAGNOSIS — I120 Hypertensive chronic kidney disease with stage 5 chronic kidney disease or end stage renal disease: Secondary | ICD-10-CM | POA: Diagnosis not present

## 2016-02-05 DIAGNOSIS — Z940 Kidney transplant status: Secondary | ICD-10-CM | POA: Diagnosis not present

## 2016-02-05 DIAGNOSIS — I10 Essential (primary) hypertension: Secondary | ICD-10-CM | POA: Diagnosis not present

## 2016-02-05 DIAGNOSIS — R109 Unspecified abdominal pain: Secondary | ICD-10-CM | POA: Diagnosis not present

## 2016-02-05 DIAGNOSIS — E041 Nontoxic single thyroid nodule: Secondary | ICD-10-CM | POA: Diagnosis not present

## 2016-02-05 DIAGNOSIS — E785 Hyperlipidemia, unspecified: Secondary | ICD-10-CM | POA: Diagnosis not present

## 2016-02-05 DIAGNOSIS — K219 Gastro-esophageal reflux disease without esophagitis: Secondary | ICD-10-CM | POA: Diagnosis not present

## 2016-02-05 DIAGNOSIS — Z992 Dependence on renal dialysis: Secondary | ICD-10-CM | POA: Diagnosis not present

## 2016-02-06 ENCOUNTER — Telehealth: Payer: Self-pay | Admitting: *Deleted

## 2016-02-06 NOTE — Telephone Encounter (Signed)
[facility] order to Coumadin for date 02/02/16 signed by Dr Gwenlyn Found and faxed.

## 2016-02-11 ENCOUNTER — Ambulatory Visit: Payer: Medicare Other | Admitting: Neurology

## 2016-02-11 DIAGNOSIS — E119 Type 2 diabetes mellitus without complications: Secondary | ICD-10-CM | POA: Diagnosis not present

## 2016-02-11 DIAGNOSIS — Z7952 Long term (current) use of systemic steroids: Secondary | ICD-10-CM | POA: Diagnosis not present

## 2016-02-11 DIAGNOSIS — Z79891 Long term (current) use of opiate analgesic: Secondary | ICD-10-CM | POA: Diagnosis not present

## 2016-02-11 DIAGNOSIS — Z7901 Long term (current) use of anticoagulants: Secondary | ICD-10-CM | POA: Diagnosis not present

## 2016-02-11 DIAGNOSIS — D6861 Antiphospholipid syndrome: Secondary | ICD-10-CM | POA: Diagnosis not present

## 2016-02-11 DIAGNOSIS — I110 Hypertensive heart disease with heart failure: Secondary | ICD-10-CM | POA: Diagnosis not present

## 2016-02-11 DIAGNOSIS — Z5181 Encounter for therapeutic drug level monitoring: Secondary | ICD-10-CM | POA: Diagnosis not present

## 2016-02-11 DIAGNOSIS — Z8673 Personal history of transient ischemic attack (TIA), and cerebral infarction without residual deficits: Secondary | ICD-10-CM | POA: Diagnosis not present

## 2016-02-11 DIAGNOSIS — N189 Chronic kidney disease, unspecified: Secondary | ICD-10-CM | POA: Diagnosis not present

## 2016-02-11 DIAGNOSIS — Z86718 Personal history of other venous thrombosis and embolism: Secondary | ICD-10-CM | POA: Diagnosis not present

## 2016-02-11 DIAGNOSIS — M3214 Glomerular disease in systemic lupus erythematosus: Secondary | ICD-10-CM | POA: Diagnosis not present

## 2016-02-11 DIAGNOSIS — I5032 Chronic diastolic (congestive) heart failure: Secondary | ICD-10-CM | POA: Diagnosis not present

## 2016-02-11 DIAGNOSIS — Z940 Kidney transplant status: Secondary | ICD-10-CM | POA: Diagnosis not present

## 2016-02-16 ENCOUNTER — Ambulatory Visit (INDEPENDENT_AMBULATORY_CARE_PROVIDER_SITE_OTHER): Payer: Medicare Other | Admitting: Internal Medicine

## 2016-02-16 DIAGNOSIS — Z7952 Long term (current) use of systemic steroids: Secondary | ICD-10-CM | POA: Diagnosis not present

## 2016-02-16 DIAGNOSIS — Z8673 Personal history of transient ischemic attack (TIA), and cerebral infarction without residual deficits: Secondary | ICD-10-CM | POA: Diagnosis not present

## 2016-02-16 DIAGNOSIS — Z5181 Encounter for therapeutic drug level monitoring: Secondary | ICD-10-CM | POA: Diagnosis not present

## 2016-02-16 DIAGNOSIS — Z86718 Personal history of other venous thrombosis and embolism: Secondary | ICD-10-CM | POA: Diagnosis not present

## 2016-02-16 DIAGNOSIS — Z940 Kidney transplant status: Secondary | ICD-10-CM | POA: Diagnosis not present

## 2016-02-16 DIAGNOSIS — Z79891 Long term (current) use of opiate analgesic: Secondary | ICD-10-CM | POA: Diagnosis not present

## 2016-02-16 DIAGNOSIS — N189 Chronic kidney disease, unspecified: Secondary | ICD-10-CM | POA: Diagnosis not present

## 2016-02-16 DIAGNOSIS — I5032 Chronic diastolic (congestive) heart failure: Secondary | ICD-10-CM | POA: Diagnosis not present

## 2016-02-16 DIAGNOSIS — E119 Type 2 diabetes mellitus without complications: Secondary | ICD-10-CM | POA: Diagnosis not present

## 2016-02-16 DIAGNOSIS — M3214 Glomerular disease in systemic lupus erythematosus: Secondary | ICD-10-CM | POA: Diagnosis not present

## 2016-02-16 DIAGNOSIS — Z7901 Long term (current) use of anticoagulants: Secondary | ICD-10-CM | POA: Diagnosis not present

## 2016-02-16 DIAGNOSIS — D6861 Antiphospholipid syndrome: Secondary | ICD-10-CM | POA: Diagnosis not present

## 2016-02-16 DIAGNOSIS — I6350 Cerebral infarction due to unspecified occlusion or stenosis of unspecified cerebral artery: Secondary | ICD-10-CM

## 2016-02-16 DIAGNOSIS — I110 Hypertensive heart disease with heart failure: Secondary | ICD-10-CM | POA: Diagnosis not present

## 2016-02-16 LAB — POCT INR: INR: 2.1

## 2016-02-23 DIAGNOSIS — Z79891 Long term (current) use of opiate analgesic: Secondary | ICD-10-CM | POA: Diagnosis not present

## 2016-02-23 DIAGNOSIS — Z86718 Personal history of other venous thrombosis and embolism: Secondary | ICD-10-CM | POA: Diagnosis not present

## 2016-02-23 DIAGNOSIS — E119 Type 2 diabetes mellitus without complications: Secondary | ICD-10-CM | POA: Diagnosis not present

## 2016-02-23 DIAGNOSIS — Z7952 Long term (current) use of systemic steroids: Secondary | ICD-10-CM | POA: Diagnosis not present

## 2016-02-23 DIAGNOSIS — I110 Hypertensive heart disease with heart failure: Secondary | ICD-10-CM | POA: Diagnosis not present

## 2016-02-23 DIAGNOSIS — Z940 Kidney transplant status: Secondary | ICD-10-CM | POA: Diagnosis not present

## 2016-02-23 DIAGNOSIS — Z5181 Encounter for therapeutic drug level monitoring: Secondary | ICD-10-CM | POA: Diagnosis not present

## 2016-02-23 DIAGNOSIS — M3214 Glomerular disease in systemic lupus erythematosus: Secondary | ICD-10-CM | POA: Diagnosis not present

## 2016-02-23 DIAGNOSIS — N189 Chronic kidney disease, unspecified: Secondary | ICD-10-CM | POA: Diagnosis not present

## 2016-02-23 DIAGNOSIS — Z7901 Long term (current) use of anticoagulants: Secondary | ICD-10-CM | POA: Diagnosis not present

## 2016-02-23 DIAGNOSIS — I5032 Chronic diastolic (congestive) heart failure: Secondary | ICD-10-CM | POA: Diagnosis not present

## 2016-02-23 DIAGNOSIS — Z8673 Personal history of transient ischemic attack (TIA), and cerebral infarction without residual deficits: Secondary | ICD-10-CM | POA: Diagnosis not present

## 2016-02-23 DIAGNOSIS — D6861 Antiphospholipid syndrome: Secondary | ICD-10-CM | POA: Diagnosis not present

## 2016-02-24 ENCOUNTER — Telehealth: Payer: Self-pay | Admitting: *Deleted

## 2016-02-24 NOTE — Telephone Encounter (Signed)
[facility] orders with collection date 02/16/16 for PT/INR/Coumadin orders signed by Dr Gwenlyn Found and successfully faxed.

## 2016-02-26 ENCOUNTER — Ambulatory Visit (INDEPENDENT_AMBULATORY_CARE_PROVIDER_SITE_OTHER): Payer: Medicare Other | Admitting: Podiatry

## 2016-02-26 ENCOUNTER — Encounter: Payer: Self-pay | Admitting: Podiatry

## 2016-02-26 VITALS — BP 129/73 | HR 66 | Resp 14

## 2016-02-26 DIAGNOSIS — M79676 Pain in unspecified toe(s): Secondary | ICD-10-CM | POA: Diagnosis not present

## 2016-02-26 DIAGNOSIS — M79609 Pain in unspecified limb: Principal | ICD-10-CM

## 2016-02-26 DIAGNOSIS — E1159 Type 2 diabetes mellitus with other circulatory complications: Secondary | ICD-10-CM | POA: Diagnosis not present

## 2016-02-26 DIAGNOSIS — B351 Tinea unguium: Secondary | ICD-10-CM

## 2016-02-26 NOTE — Progress Notes (Signed)
   Subjective:    Patient ID: Connie Ruiz, female    DOB: [birth year], 55 y.o.   MRN: [id number]  HPI this patient presents the office with chief complaint of long thick painful nails. Patient has medical history of lupus, stroke as well as diabetes. Patient states that she has pain walking and wearing her shoes. She presents the office today for preventative foot care services    Review of Systems  All other systems reviewed and are negative.      Objective:   Physical Exam GENERAL APPEARANCE: Alert, conversant. Appropriately groomed. No acute distress.  VASCULAR: Pedal pulses are  Not  palpable at  [facility] and PT bilateral.  Capillary refill time is immediate to all digits,  Normal temperature gradient.    NEUROLOGIC: sensation is normal to 5.07 monofilament at 5/5 sites bilateral.  Light touch is intact bilateral, Muscle strength normal.  MUSCULOSKELETAL: acceptable muscle strength, tone and stability bilateral.  Intrinsic muscluature intact bilateral.  Rectus appearance of foot and digits noted bilateral. Hallux varus B/L  DERMATOLOGIC: skin color, texture, and turgor are within normal limits.  No preulcerative lesions or ulcers  are seen, no interdigital maceration noted.  No open lesions present.  . No drainage noted.  NAILS  Thick disfigured discolored nails both feet.        Assessment & Plan:  Onychomycosis  DM with angiopathy  Debridement of Nails  RTC 3 months.Gardiner Barefoot DPM

## 2016-02-27 ENCOUNTER — Telehealth: Payer: Self-pay | Admitting: *Deleted

## 2016-02-27 NOTE — Telephone Encounter (Signed)
[facility] order form date for 02/25/16 signed by Dr Gwenlyn Found and faxed successfully.

## 2016-03-01 ENCOUNTER — Ambulatory Visit (INDEPENDENT_AMBULATORY_CARE_PROVIDER_SITE_OTHER): Payer: Medicare Other | Admitting: Cardiology

## 2016-03-01 DIAGNOSIS — I5032 Chronic diastolic (congestive) heart failure: Secondary | ICD-10-CM | POA: Diagnosis not present

## 2016-03-01 DIAGNOSIS — Z940 Kidney transplant status: Secondary | ICD-10-CM | POA: Diagnosis not present

## 2016-03-01 DIAGNOSIS — I6350 Cerebral infarction due to unspecified occlusion or stenosis of unspecified cerebral artery: Secondary | ICD-10-CM

## 2016-03-01 DIAGNOSIS — Z7952 Long term (current) use of systemic steroids: Secondary | ICD-10-CM | POA: Diagnosis not present

## 2016-03-01 DIAGNOSIS — M3214 Glomerular disease in systemic lupus erythematosus: Secondary | ICD-10-CM | POA: Diagnosis not present

## 2016-03-01 DIAGNOSIS — Z9181 History of falling: Secondary | ICD-10-CM | POA: Diagnosis not present

## 2016-03-01 DIAGNOSIS — Z5181 Encounter for therapeutic drug level monitoring: Secondary | ICD-10-CM | POA: Diagnosis not present

## 2016-03-01 DIAGNOSIS — E1122 Type 2 diabetes mellitus with diabetic chronic kidney disease: Secondary | ICD-10-CM | POA: Diagnosis not present

## 2016-03-01 DIAGNOSIS — I110 Hypertensive heart disease with heart failure: Secondary | ICD-10-CM | POA: Diagnosis not present

## 2016-03-01 DIAGNOSIS — D6861 Antiphospholipid syndrome: Secondary | ICD-10-CM | POA: Diagnosis not present

## 2016-03-01 DIAGNOSIS — N189 Chronic kidney disease, unspecified: Secondary | ICD-10-CM | POA: Diagnosis not present

## 2016-03-01 DIAGNOSIS — Z79891 Long term (current) use of opiate analgesic: Secondary | ICD-10-CM | POA: Diagnosis not present

## 2016-03-01 DIAGNOSIS — Z7901 Long term (current) use of anticoagulants: Secondary | ICD-10-CM | POA: Diagnosis not present

## 2016-03-01 LAB — POCT INR: INR: 2.1

## 2016-03-04 ENCOUNTER — Telehealth: Payer: Self-pay | Admitting: *Deleted

## 2016-03-04 NOTE — Telephone Encounter (Signed)
Physician Communication and Interim Order dated 02/25/16 signed by Dr Gwenlyn Found and faxed. Order stated "SN requesting to continue for 1wk9, for medical/medication mgmt. D/t non-adherence with daily medications and INR monitoring per MD order.

## 2016-03-08 DIAGNOSIS — I5032 Chronic diastolic (congestive) heart failure: Secondary | ICD-10-CM | POA: Diagnosis not present

## 2016-03-08 DIAGNOSIS — Z9181 History of falling: Secondary | ICD-10-CM | POA: Diagnosis not present

## 2016-03-08 DIAGNOSIS — I110 Hypertensive heart disease with heart failure: Secondary | ICD-10-CM | POA: Diagnosis not present

## 2016-03-08 DIAGNOSIS — Z5181 Encounter for therapeutic drug level monitoring: Secondary | ICD-10-CM | POA: Diagnosis not present

## 2016-03-08 DIAGNOSIS — M3214 Glomerular disease in systemic lupus erythematosus: Secondary | ICD-10-CM | POA: Diagnosis not present

## 2016-03-08 DIAGNOSIS — Z7901 Long term (current) use of anticoagulants: Secondary | ICD-10-CM | POA: Diagnosis not present

## 2016-03-08 DIAGNOSIS — D6861 Antiphospholipid syndrome: Secondary | ICD-10-CM | POA: Diagnosis not present

## 2016-03-08 DIAGNOSIS — N189 Chronic kidney disease, unspecified: Secondary | ICD-10-CM | POA: Diagnosis not present

## 2016-03-08 DIAGNOSIS — E1122 Type 2 diabetes mellitus with diabetic chronic kidney disease: Secondary | ICD-10-CM | POA: Diagnosis not present

## 2016-03-08 DIAGNOSIS — Z79891 Long term (current) use of opiate analgesic: Secondary | ICD-10-CM | POA: Diagnosis not present

## 2016-03-08 DIAGNOSIS — Z940 Kidney transplant status: Secondary | ICD-10-CM | POA: Diagnosis not present

## 2016-03-08 DIAGNOSIS — Z7952 Long term (current) use of systemic steroids: Secondary | ICD-10-CM | POA: Diagnosis not present

## 2016-03-11 ENCOUNTER — Other Ambulatory Visit: Payer: Self-pay

## 2016-03-11 MED ORDER — SERTRALINE HCL 100 MG PO TABS: 50.0000 mg | ORAL_TABLET | Freq: Every day | ORAL | 2 refills | Status: DC

## 2016-03-15 DIAGNOSIS — I110 Hypertensive heart disease with heart failure: Secondary | ICD-10-CM | POA: Diagnosis not present

## 2016-03-15 DIAGNOSIS — M3214 Glomerular disease in systemic lupus erythematosus: Secondary | ICD-10-CM | POA: Diagnosis not present

## 2016-03-15 DIAGNOSIS — Z7901 Long term (current) use of anticoagulants: Secondary | ICD-10-CM | POA: Diagnosis not present

## 2016-03-15 DIAGNOSIS — Z79891 Long term (current) use of opiate analgesic: Secondary | ICD-10-CM | POA: Diagnosis not present

## 2016-03-15 DIAGNOSIS — Z7952 Long term (current) use of systemic steroids: Secondary | ICD-10-CM | POA: Diagnosis not present

## 2016-03-15 DIAGNOSIS — I5032 Chronic diastolic (congestive) heart failure: Secondary | ICD-10-CM | POA: Diagnosis not present

## 2016-03-15 DIAGNOSIS — D6861 Antiphospholipid syndrome: Secondary | ICD-10-CM | POA: Diagnosis not present

## 2016-03-15 DIAGNOSIS — Z5181 Encounter for therapeutic drug level monitoring: Secondary | ICD-10-CM | POA: Diagnosis not present

## 2016-03-15 DIAGNOSIS — Z940 Kidney transplant status: Secondary | ICD-10-CM | POA: Diagnosis not present

## 2016-03-15 DIAGNOSIS — N189 Chronic kidney disease, unspecified: Secondary | ICD-10-CM | POA: Diagnosis not present

## 2016-03-15 DIAGNOSIS — E1122 Type 2 diabetes mellitus with diabetic chronic kidney disease: Secondary | ICD-10-CM | POA: Diagnosis not present

## 2016-03-15 DIAGNOSIS — Z9181 History of falling: Secondary | ICD-10-CM | POA: Diagnosis not present

## 2016-03-16 ENCOUNTER — Telehealth: Payer: Self-pay | Admitting: *Deleted

## 2016-03-16 NOTE — Telephone Encounter (Signed)
Summary/Case Conference Report Follow-up from [facility] with Start of Care Date 03/01/16 and Date of conference 02/28/06. Reviewed and signed by Dr Gwenlyn Found and faxed to number provided.

## 2016-03-17 ENCOUNTER — Other Ambulatory Visit: Payer: Self-pay

## 2016-03-17 MED ORDER — ATORVASTATIN CALCIUM 40 MG PO TABS: 40.0000 mg | ORAL_TABLET | Freq: Every day | ORAL | 5 refills | Status: DC

## 2016-03-17 MED ORDER — LOSARTAN POTASSIUM 100 MG PO TABS: 100.0000 mg | ORAL_TABLET | Freq: Every day | ORAL | 5 refills | Status: DC

## 2016-03-23 ENCOUNTER — Ambulatory Visit (INDEPENDENT_AMBULATORY_CARE_PROVIDER_SITE_OTHER): Payer: Medicare Other | Admitting: Pharmacist Clinician (PhC)/ Clinical Pharmacy Specialist

## 2016-03-23 DIAGNOSIS — M3214 Glomerular disease in systemic lupus erythematosus: Secondary | ICD-10-CM | POA: Diagnosis not present

## 2016-03-23 DIAGNOSIS — Z9181 History of falling: Secondary | ICD-10-CM | POA: Diagnosis not present

## 2016-03-23 DIAGNOSIS — Z5181 Encounter for therapeutic drug level monitoring: Secondary | ICD-10-CM | POA: Diagnosis not present

## 2016-03-23 DIAGNOSIS — I5032 Chronic diastolic (congestive) heart failure: Secondary | ICD-10-CM | POA: Diagnosis not present

## 2016-03-23 DIAGNOSIS — I110 Hypertensive heart disease with heart failure: Secondary | ICD-10-CM | POA: Diagnosis not present

## 2016-03-23 DIAGNOSIS — I6350 Cerebral infarction due to unspecified occlusion or stenosis of unspecified cerebral artery: Secondary | ICD-10-CM

## 2016-03-23 DIAGNOSIS — Z7901 Long term (current) use of anticoagulants: Secondary | ICD-10-CM | POA: Diagnosis not present

## 2016-03-23 DIAGNOSIS — Z79891 Long term (current) use of opiate analgesic: Secondary | ICD-10-CM | POA: Diagnosis not present

## 2016-03-23 DIAGNOSIS — D6861 Antiphospholipid syndrome: Secondary | ICD-10-CM | POA: Diagnosis not present

## 2016-03-23 DIAGNOSIS — E1122 Type 2 diabetes mellitus with diabetic chronic kidney disease: Secondary | ICD-10-CM | POA: Diagnosis not present

## 2016-03-23 DIAGNOSIS — N189 Chronic kidney disease, unspecified: Secondary | ICD-10-CM | POA: Diagnosis not present

## 2016-03-23 DIAGNOSIS — Z7952 Long term (current) use of systemic steroids: Secondary | ICD-10-CM | POA: Diagnosis not present

## 2016-03-23 DIAGNOSIS — Z940 Kidney transplant status: Secondary | ICD-10-CM | POA: Diagnosis not present

## 2016-03-23 LAB — POCT INR: INR: 1.3

## 2016-03-30 ENCOUNTER — Ambulatory Visit (INDEPENDENT_AMBULATORY_CARE_PROVIDER_SITE_OTHER): Payer: Medicare Other | Admitting: Cardiovascular Disease

## 2016-03-30 DIAGNOSIS — M3214 Glomerular disease in systemic lupus erythematosus: Secondary | ICD-10-CM | POA: Diagnosis not present

## 2016-03-30 DIAGNOSIS — Z7952 Long term (current) use of systemic steroids: Secondary | ICD-10-CM | POA: Diagnosis not present

## 2016-03-30 DIAGNOSIS — Z5181 Encounter for therapeutic drug level monitoring: Secondary | ICD-10-CM | POA: Diagnosis not present

## 2016-03-30 DIAGNOSIS — Z940 Kidney transplant status: Secondary | ICD-10-CM | POA: Diagnosis not present

## 2016-03-30 DIAGNOSIS — I5032 Chronic diastolic (congestive) heart failure: Secondary | ICD-10-CM | POA: Diagnosis not present

## 2016-03-30 DIAGNOSIS — Z7901 Long term (current) use of anticoagulants: Secondary | ICD-10-CM | POA: Diagnosis not present

## 2016-03-30 DIAGNOSIS — E1122 Type 2 diabetes mellitus with diabetic chronic kidney disease: Secondary | ICD-10-CM | POA: Diagnosis not present

## 2016-03-30 DIAGNOSIS — Z79891 Long term (current) use of opiate analgesic: Secondary | ICD-10-CM | POA: Diagnosis not present

## 2016-03-30 DIAGNOSIS — Z9181 History of falling: Secondary | ICD-10-CM | POA: Diagnosis not present

## 2016-03-30 DIAGNOSIS — I110 Hypertensive heart disease with heart failure: Secondary | ICD-10-CM | POA: Diagnosis not present

## 2016-03-30 DIAGNOSIS — N189 Chronic kidney disease, unspecified: Secondary | ICD-10-CM | POA: Diagnosis not present

## 2016-03-30 DIAGNOSIS — I6350 Cerebral infarction due to unspecified occlusion or stenosis of unspecified cerebral artery: Secondary | ICD-10-CM

## 2016-03-30 DIAGNOSIS — D6861 Antiphospholipid syndrome: Secondary | ICD-10-CM | POA: Diagnosis not present

## 2016-03-30 LAB — POCT INR: INR: 2

## 2016-04-05 DIAGNOSIS — E1122 Type 2 diabetes mellitus with diabetic chronic kidney disease: Secondary | ICD-10-CM | POA: Diagnosis not present

## 2016-04-05 DIAGNOSIS — M3214 Glomerular disease in systemic lupus erythematosus: Secondary | ICD-10-CM | POA: Diagnosis not present

## 2016-04-05 DIAGNOSIS — I5032 Chronic diastolic (congestive) heart failure: Secondary | ICD-10-CM | POA: Diagnosis not present

## 2016-04-05 DIAGNOSIS — Z5181 Encounter for therapeutic drug level monitoring: Secondary | ICD-10-CM | POA: Diagnosis not present

## 2016-04-05 DIAGNOSIS — D6861 Antiphospholipid syndrome: Secondary | ICD-10-CM | POA: Diagnosis not present

## 2016-04-05 DIAGNOSIS — Z79891 Long term (current) use of opiate analgesic: Secondary | ICD-10-CM | POA: Diagnosis not present

## 2016-04-05 DIAGNOSIS — Z940 Kidney transplant status: Secondary | ICD-10-CM | POA: Diagnosis not present

## 2016-04-05 DIAGNOSIS — Z9181 History of falling: Secondary | ICD-10-CM | POA: Diagnosis not present

## 2016-04-05 DIAGNOSIS — N189 Chronic kidney disease, unspecified: Secondary | ICD-10-CM | POA: Diagnosis not present

## 2016-04-05 DIAGNOSIS — Z7901 Long term (current) use of anticoagulants: Secondary | ICD-10-CM | POA: Diagnosis not present

## 2016-04-05 DIAGNOSIS — I110 Hypertensive heart disease with heart failure: Secondary | ICD-10-CM | POA: Diagnosis not present

## 2016-04-05 DIAGNOSIS — Z7952 Long term (current) use of systemic steroids: Secondary | ICD-10-CM | POA: Diagnosis not present

## 2016-04-08 ENCOUNTER — Telehealth: Payer: Self-pay | Admitting: Cardiovascular Disease

## 2016-04-08 NOTE — Telephone Encounter (Signed)
[facility] orders dated 10/24 concerning Coumadin. Reviewed and signed by Dr. Gwenlyn Found. Faxed to number provided.  Physician Communication and Interim Order for 10/16. Reviewed and signed to Dr. Gwenlyn Found. Faxed to number provided.

## 2016-04-13 ENCOUNTER — Ambulatory Visit (INDEPENDENT_AMBULATORY_CARE_PROVIDER_SITE_OTHER): Payer: Medicare Other | Admitting: Internal Medicine

## 2016-04-13 DIAGNOSIS — Z5181 Encounter for therapeutic drug level monitoring: Secondary | ICD-10-CM | POA: Diagnosis not present

## 2016-04-13 DIAGNOSIS — Z940 Kidney transplant status: Secondary | ICD-10-CM | POA: Diagnosis not present

## 2016-04-13 DIAGNOSIS — Z7901 Long term (current) use of anticoagulants: Secondary | ICD-10-CM | POA: Diagnosis not present

## 2016-04-13 DIAGNOSIS — I5032 Chronic diastolic (congestive) heart failure: Secondary | ICD-10-CM | POA: Diagnosis not present

## 2016-04-13 DIAGNOSIS — D6861 Antiphospholipid syndrome: Secondary | ICD-10-CM | POA: Diagnosis not present

## 2016-04-13 DIAGNOSIS — Z9181 History of falling: Secondary | ICD-10-CM | POA: Diagnosis not present

## 2016-04-13 DIAGNOSIS — M3214 Glomerular disease in systemic lupus erythematosus: Secondary | ICD-10-CM | POA: Diagnosis not present

## 2016-04-13 DIAGNOSIS — Z79891 Long term (current) use of opiate analgesic: Secondary | ICD-10-CM | POA: Diagnosis not present

## 2016-04-13 DIAGNOSIS — I110 Hypertensive heart disease with heart failure: Secondary | ICD-10-CM | POA: Diagnosis not present

## 2016-04-13 DIAGNOSIS — Z7952 Long term (current) use of systemic steroids: Secondary | ICD-10-CM | POA: Diagnosis not present

## 2016-04-13 DIAGNOSIS — E1122 Type 2 diabetes mellitus with diabetic chronic kidney disease: Secondary | ICD-10-CM | POA: Diagnosis not present

## 2016-04-13 DIAGNOSIS — I6350 Cerebral infarction due to unspecified occlusion or stenosis of unspecified cerebral artery: Secondary | ICD-10-CM

## 2016-04-13 DIAGNOSIS — N189 Chronic kidney disease, unspecified: Secondary | ICD-10-CM | POA: Diagnosis not present

## 2016-04-13 LAB — POCT INR: INR: 1.8

## 2016-04-16 ENCOUNTER — Telehealth: Payer: Self-pay | Admitting: Cardiovascular Disease

## 2016-04-16 NOTE — Telephone Encounter (Signed)
Anti-Coagulation Medication Dosage and Assessment Findings--03/23/16  "Coumadin 7.5 today and Wednesday then return to Coumadin 5 mg on Sun., Mon., Wed., and Fri. And Coumadin 2.5 mg on Tues., Thurs., and Sat."  Signed by Dr. Gwenlyn Found and faxed to number provided.

## 2016-04-16 NOTE — Telephone Encounter (Signed)
Anti-Coagulation Medication Dosage and Assessment Findings--04/13/16  "Take Coumadin 5 mg tonight; take Coumadin 5 mg daily except Tues/Sat take Coumadin 2.5 mg and recheck on Friday 11/17."  Signed by Dr. Gwenlyn Found and faxed to number provided.

## 2016-04-16 NOTE — Telephone Encounter (Signed)
Home Health Certification and Plan of Care signed by Dr. Gwenlyn Found and faxed to number provided.

## 2016-04-23 ENCOUNTER — Telehealth: Payer: Self-pay | Admitting: Pharmacist Clinician (PhC)/ Clinical Pharmacy Specialist

## 2016-04-23 NOTE — Telephone Encounter (Signed)
Patient being d/c from [facility] today.  RN went to home for scheduled appt, but nobody was home.  RN was hoping to educate patient and caregiver about her medications and set up her meds for next week.    She is discharging patient as of today.  We will reach out to patient to get her into office.  Spoke with patient, she was unsure about appts, asked that we contact her daughter (per comments under permanent contacts).  VM stated name was Marzetta Board. LM asking if this was Ms. Valera's daughter to please call back so we can schedule appt for INR check

## 2016-04-26 NOTE — Telephone Encounter (Signed)
Spoke with pt and appt scheduled for tomorrow at 945am.

## 2016-04-27 ENCOUNTER — Ambulatory Visit (INDEPENDENT_AMBULATORY_CARE_PROVIDER_SITE_OTHER): Payer: Medicare Other

## 2016-04-27 DIAGNOSIS — I6350 Cerebral infarction due to unspecified occlusion or stenosis of unspecified cerebral artery: Secondary | ICD-10-CM | POA: Diagnosis not present

## 2016-04-27 DIAGNOSIS — I2699 Other pulmonary embolism without acute cor pulmonale: Secondary | ICD-10-CM

## 2016-04-27 LAB — POCT INR: INR: 2.6

## 2016-05-11 ENCOUNTER — Ambulatory Visit (INDEPENDENT_AMBULATORY_CARE_PROVIDER_SITE_OTHER): Payer: Medicare Other | Admitting: *Deleted

## 2016-05-11 DIAGNOSIS — I6350 Cerebral infarction due to unspecified occlusion or stenosis of unspecified cerebral artery: Secondary | ICD-10-CM | POA: Diagnosis not present

## 2016-05-11 DIAGNOSIS — I2699 Other pulmonary embolism without acute cor pulmonale: Secondary | ICD-10-CM | POA: Diagnosis not present

## 2016-05-11 LAB — POCT INR: INR: 2.3

## 2016-05-20 ENCOUNTER — Ambulatory Visit (INDEPENDENT_AMBULATORY_CARE_PROVIDER_SITE_OTHER): Payer: Medicare Other | Admitting: Podiatry

## 2016-05-20 ENCOUNTER — Encounter: Payer: Self-pay | Admitting: Podiatry

## 2016-05-20 VITALS — Ht 64.0 in | Wt 144.0 lb

## 2016-05-20 DIAGNOSIS — B351 Tinea unguium: Secondary | ICD-10-CM

## 2016-05-20 DIAGNOSIS — M79609 Pain in unspecified limb: Secondary | ICD-10-CM | POA: Diagnosis not present

## 2016-05-20 DIAGNOSIS — E1159 Type 2 diabetes mellitus with other circulatory complications: Secondary | ICD-10-CM

## 2016-05-20 NOTE — Progress Notes (Signed)
   Subjective:    Patient ID: Connie Ruiz, female    DOB: 1961/01/18, 55 y.o.   MRN: [id number]  HPI this patient presents the office with chief complaint of long thick painful nails. Patient has medical history of lupus, stroke as well as diabetes. Patient states that she has pain walking and wearing her shoes. She presents the office today for preventative foot care services    Review of Systems  All other systems reviewed and are negative.      Objective:   Physical Exam GENERAL APPEARANCE: Alert, conversant. Appropriately groomed. No acute distress.  VASCULAR: Pedal pulses are  Not  palpable at  [facility] and PT bilateral.  Capillary refill time is immediate to all digits,  Normal temperature gradient.    NEUROLOGIC: sensation is normal to 5.07 monofilament at 5/5 sites bilateral.  Light touch is intact bilateral, Muscle strength normal.  MUSCULOSKELETAL: acceptable muscle strength, tone and stability bilateral.  Intrinsic muscluature intact bilateral.  Rectus appearance of foot and digits noted bilateral. Hallux varus B/L  DERMATOLOGIC: skin color, texture, and turgor are within normal limits.  No preulcerative lesions or ulcers  are seen, no interdigital maceration noted.  No open lesions present.  . No drainage noted.  NAILS  Thick disfigured discolored nails both feet.        Assessment & Plan:  Onychomycosis  DM with angiopathy  Debridement of Nails  RTC 3 months.Gardiner Barefoot DPM

## 2016-06-02 ENCOUNTER — Other Ambulatory Visit: Payer: Self-pay

## 2016-06-02 MED ORDER — CALCITRIOL 0.25 MCG PO CAPS: 0.2500 ug | ORAL_CAPSULE | Freq: Every day | ORAL | 3 refills | Status: DC

## 2016-06-08 ENCOUNTER — Ambulatory Visit (INDEPENDENT_AMBULATORY_CARE_PROVIDER_SITE_OTHER): Payer: Medicare Other | Admitting: *Deleted

## 2016-06-08 DIAGNOSIS — I6350 Cerebral infarction due to unspecified occlusion or stenosis of unspecified cerebral artery: Secondary | ICD-10-CM

## 2016-06-08 DIAGNOSIS — I2699 Other pulmonary embolism without acute cor pulmonale: Secondary | ICD-10-CM | POA: Diagnosis not present

## 2016-06-08 LAB — POCT INR: INR: 2.5

## 2016-07-09 ENCOUNTER — Ambulatory Visit (INDEPENDENT_AMBULATORY_CARE_PROVIDER_SITE_OTHER): Payer: Medicare Other | Admitting: *Deleted

## 2016-07-09 DIAGNOSIS — I2699 Other pulmonary embolism without acute cor pulmonale: Secondary | ICD-10-CM | POA: Diagnosis not present

## 2016-07-09 DIAGNOSIS — I6350 Cerebral infarction due to unspecified occlusion or stenosis of unspecified cerebral artery: Secondary | ICD-10-CM | POA: Diagnosis not present

## 2016-07-09 LAB — POCT INR: INR: 2.3

## 2016-08-10 ENCOUNTER — Ambulatory Visit (INDEPENDENT_AMBULATORY_CARE_PROVIDER_SITE_OTHER): Payer: Medicare Other | Admitting: Endocrinology

## 2016-08-10 VITALS — BP 122/86 | HR 88 | Ht 64.0 in | Wt 166.0 lb

## 2016-08-10 DIAGNOSIS — Z0000 Encounter for general adult medical examination without abnormal findings: Secondary | ICD-10-CM

## 2016-08-10 DIAGNOSIS — Z23 Encounter for immunization: Secondary | ICD-10-CM

## 2016-08-10 DIAGNOSIS — Z992 Dependence on renal dialysis: Secondary | ICD-10-CM | POA: Diagnosis not present

## 2016-08-10 DIAGNOSIS — N186 End stage renal disease: Secondary | ICD-10-CM | POA: Diagnosis not present

## 2016-08-10 DIAGNOSIS — M1000 Idiopathic gout, unspecified site: Secondary | ICD-10-CM

## 2016-08-10 DIAGNOSIS — I10 Essential (primary) hypertension: Secondary | ICD-10-CM | POA: Diagnosis not present

## 2016-08-10 DIAGNOSIS — E785 Hyperlipidemia, unspecified: Secondary | ICD-10-CM | POA: Diagnosis not present

## 2016-08-10 DIAGNOSIS — M810 Age-related osteoporosis without current pathological fracture: Secondary | ICD-10-CM | POA: Diagnosis not present

## 2016-08-10 DIAGNOSIS — E041 Nontoxic single thyroid nodule: Secondary | ICD-10-CM | POA: Diagnosis not present

## 2016-08-10 DIAGNOSIS — E1122 Type 2 diabetes mellitus with diabetic chronic kidney disease: Secondary | ICD-10-CM | POA: Diagnosis not present

## 2016-08-10 DIAGNOSIS — E876 Hypokalemia: Secondary | ICD-10-CM

## 2016-08-10 NOTE — Patient Instructions (Addendum)
blood tests are requested for you today.  We'll let you know about the results. Please consider these measures for your health:  minimize alcohol.  Do not use tobacco products.  Have a colonoscopy at least every 10 years from age 56.  Women should have an annual mammogram from age 79.  Keep firearms safely stored.  Always use seat belts.  have working smoke alarms in your home.  See an eye doctor and dentist regularly.  Never drive under the influence of alcohol or drugs (including prescription drugs).  Those with fair skin should take precautions against the sun, and should carefully examine their skin once per month, for any new or changed moles. Please come back for a follow-up appointment in 6 months.

## 2016-08-10 NOTE — Progress Notes (Signed)
Subjective:    Patient ID: Connie Ruiz, female    DOB: 1960/07/24, 56 y.o.   MRN: [id number]  HPI Pt is here for regular wellness examination, and is feeling pretty well in general, and says chronic med probs are stable, except as noted below Past Medical History:  Diagnosis Date  . Candida esophagitis ([place]) 11/12/2014  . CEREBROVASCULAR ACCIDENT, ACUTE 04/15/2010  . CLOSTRIDIUM DIFFICILE COLITIS, HX OF 08/21/2007  . CONGESTIVE HEART FAILURE 08/21/2007  . Current use of long term anticoagulation    Dr. Andree Elk, [facility]  . CVA 04/17/2010  . Depression    Dr. Andree Elk, [place], TYPE II 08/21/2007  . DVT, HX OF 08/21/2007  . GERD 08/21/2007  . GOUT 08/21/2007  . History of stroke with residual effects   . HYPERLIPIDEMIA 08/21/2007  . HYPERTENSION 08/21/2007   Dr. Andree Elk, [place], HX OF 08/22/2007   s/p renal transplant-Dr. Andree Elk, [place] 08/21/2007  . OSTEOPOROSIS 08/21/2007   [facility]ol at [facility]  . Pulmonary embolism ([place]) 07/16/2010  . Renal failure   . RENAL INSUFFICIENCY 08/21/2007  . Right sided weakness   . Steroid-induced hyperglycemia 11/09/2014  . Tachycardia   . THYROID NODULE, LEFT 04/10/2009    Past Surgical History:  Procedure Laterality Date  . CHOLECYSTECTOMY    . ENTEROSCOPY N/A 11/11/2014   Procedure: ENTEROSCOPY;  Surgeon: Ladene Artist, MD;  Location: [facility];  Service: Endoscopy;  Laterality: N/A;  . KIDNEY TRANSPLANT Right 2009  . RENAL BIOPSY, OPEN  1981  . TUBAL LIGATION      Social History   Social History  . Marital status: Divorced    Spouse name: N/A  . Number of children: 1  . Years of education: N/A   Occupational History  . DISABILITY Unemployed   Social History Main Topics  . Smoking status: Never Smoker  . Smokeless tobacco: Never Used  . Alcohol use No  . Drug use: No  . Sexual activity: No   Other Topics Concern  . Not on file   Social History Narrative   Retired   Regular  exercise-no   Pt completed hs.     Current Outpatient Prescriptions on File Prior to Visit  Medication Sig Dispense Refill  . alendronate (FOSAMAX) 70 MG tablet Take 70 mg by mouth once a week. Take on Saturdays with a full glass of water on an empty stomach.    Marland Kitchen ammonium lactate (AMLACTIN) 12 % cream Apply 1 g topically 2 (two) times daily as needed for dry skin.     Marland Kitchen atorvastatin (LIPITOR) 40 MG tablet Take 1 tablet (40 mg total) by mouth daily. 30 tablet 5  . Blood Glucose Monitoring Suppl (ONETOUCH VERIO IQ SYSTEM) W/DEVICE KIT Use to check blood sugar 1 time per day 1 kit 0  . calcitRIOL (ROCALTROL) 0.25 MCG capsule Take 1 capsule (0.25 mcg total) by mouth daily. 30 capsule 3  . diltiazem (TIAZAC) 240 MG 24 hr capsule Take 1 capsule (240 mg total) by mouth daily. 30 capsule 11  . donepezil (ARICEPT) 10 MG tablet TAKE 1 TABLET(10 MG) BY MOUTH AT BEDTIME 30 tablet 5  . esomeprazole (NEXIUM) 20 MG capsule Take 20 mg by mouth daily.    Marland Kitchen glucose blood (ONE TOUCH ULTRA TEST) test strip Use to check blood sugar 1 time per day 100 each 2  . losartan (COZAAR) 100 MG tablet Take 1 tablet (100 mg total) by mouth daily.  30 tablet 5  . mirtazapine (REMERON) 15 MG tablet TAKE 1 TABLET(15 MG) BY MOUTH AT BEDTIME 30 tablet 5  . mirtazapine (REMERON) 30 MG tablet Take 1 tablet (30 mg total) by mouth at bedtime. 30 tablet 1  . mycophenolate (CELLCEPT) 250 MG capsule Take 250 mg by mouth 2 (two) times daily.    Marland Kitchen nystatin (MYCOSTATIN) powder Apply 1 g topically 4 (four) times daily as needed. For yeast under breast    . ondansetron (ZOFRAN ODT) 4 MG disintegrating tablet 34m ODT q4 hours prn nausea/vomiting 15 tablet 0  . ONETOUCH DELICA LANCETS 370YMISC Use to check blood sugar 1 time per day 100 each 2  . oxybutynin (DITROPAN) 5 MG tablet Take 5 mg by mouth daily as needed for bladder spasms.     .Marland KitchenQUEtiapine (SEROQUEL) 25 MG tablet Take 1 tablet (25 mg total) by mouth at bedtime. 30 tablet 1  .  sertraline (ZOLOFT) 100 MG tablet Take 0.5 tablets (50 mg total) by mouth daily. 15 tablet 2  . tacrolimus (PROGRAF) 1 MG capsule Take 1 mg by mouth 2 (two) times daily.     . traMADol (ULTRAM) 50 MG tablet Take 1 tablet (50 mg total) by mouth every 8 (eight) hours as needed. 30 tablet 0  . triamcinolone (KENALOG) 0.025 % cream Apply 1 application topically daily as needed (dry skin).     .Marland Kitchenwarfarin (COUMADIN) 5 MG tablet Take 1 to 2 tablets by mouth daily as directed by coumadin clinic 30 tablet 0  . warfarin (COUMADIN) 5 MG tablet Take 2.5-5 mg by mouth See admin instructions. Take 1 tablet on Monday, Wednesday and Friday then Take 1/2 tablet all there other days     No current facility-administered medications on file prior to visit.     Allergies  Allergen Reactions  . Oxycodone-Acetaminophen Shortness Of Breath and Nausea Only  . Propoxyphene N-Acetaminophen Shortness Of Breath and Nausea Only  . Sulfonamide Derivatives Shortness Of Breath and Nausea Only  . Adhesive [Tape]   . Codeine Nausea Only  . Other Other (See Comments)    No blood, Jehovaeh Witness   . Gabapentin Anxiety    twitching  . Latex Rash  . Metoprolol Rash  . Morphine And Related Rash    IV site on arm is red, patient reports this is improving.  NO shortness of breath reported.    Family History  Problem Relation Age of Onset  . Heart attack Mother   . Heart disease Father   . Asthma Sister   . Asthma Sister   . Asthma Daughter   . Cancer Maternal Grandfather     prostate  . Cancer Paternal Grandfather     colon    BP 122/86   Pulse 88   Ht _0  (1.626 m)   Wt 166 lb (75.3 kg)   SpO2 97%   BMI 28.49 kg/m     Review of Systems Constitutional: Negative for fever.  She has gained a few lbs.  HENT: Negative for hearing loss.   Eyes: Negative for visual disturbance.  Respiratory: Negative for shortness of breath.   Cardiovascular: Negative for chest pain.  Gastrointestinal: Negative for anal  bleeding.  Endocrine: Positive for cold intolerance.  Genitourinary: Negative for hematuria.  Musculoskeletal: Negative for back pain.  Skin: Negative for rash.  Allergic/Immunologic: Negative for environmental allergies.  Neurological: Negative for syncope and headaches.  Hematological: Bruises/bleeds easily.  Psychiatric/Behavioral: Sees psych for depression  Objective:   Physical Exam VS: see vs page GEN: no distress HEAD: head: no deformity eyes: no periorbital swelling, but there is bilateral proptosis.  external nose and ears are normal mouth: no lesion seen NECK: supple, thyroid is not enlarged.  2 cm freely mobile thyroid nodule.  CHEST WALL: no deformity.   LUNGS:  Clear to auscultation.   BREASTS: sees gyn.   CV: reg rate and rhythm, no murmur ABD: abdomen is soft.  Diffuse guarding.  no hepatosplenomegaly.  not distended.  no hernia.  Old healed surgical scar. GENITALIA/RECTAL: sees gyn MUSCULOSKELETAL: muscle bulk and strength are grossly normal.  no obvious joint swelling.  gait is steady with a cane EXTEMITIES: no deformity.  no ulcer on the feet.  feet are of normal color and temp.  Trace bilat leg edema  Healed HD access at the right upper arm.  There is bilateral onychomycosis of the toenails.  PULSES: dorsalis pedis intact bilat.  no carotid bruit NEURO:  cn 2-12 grossly intact.   readily moves all 4's.  sensation is intact to touch on the feet.   SKIN:  Normal texture and temperature.  No rash or suspicious lesion is visible.  Patchy hyperpigmentation is noted on the feet.  NODES:  None palpable at the neck PSYCH: alert, well-oriented.  Does not appear anxious nor depressed.      Assessment & Plan:  Wellness visit today, with problems stable, except as noted.

## 2016-08-11 LAB — CBC WITH DIFFERENTIAL/PLATELET
Basophils Absolute: 0 10*3/uL (ref 0.0–0.1)
Basophils Relative: 0.5 % (ref 0.0–3.0)
Eosinophils Absolute: 0.1 10*3/uL (ref 0.0–0.7)
Eosinophils Relative: 0.9 % (ref 0.0–5.0)
HCT: 43.1 % (ref 36.0–46.0)
Hemoglobin: 14.4 g/dL (ref 12.0–15.0)
Lymphocytes Relative: 46.5 % — ABNORMAL HIGH (ref 12.0–46.0)
Lymphs Abs: 3.5 10*3/uL (ref 0.7–4.0)
MCHC: 33.5 g/dL (ref 30.0–36.0)
MCV: 90.4 fl (ref 78.0–100.0)
Monocytes Absolute: 0.6 10*3/uL (ref 0.1–1.0)
Monocytes Relative: 8.2 % (ref 3.0–12.0)
Neutro Abs: 3.3 10*3/uL (ref 1.4–7.7)
Neutrophils Relative %: 43.9 % (ref 43.0–77.0)
Platelets: 212 10*3/uL (ref 150.0–400.0)
RBC: 4.77 Mil/uL (ref 3.87–5.11)
RDW: 13.9 % (ref 11.5–15.5)
WBC: 7.5 10*3/uL (ref 4.0–10.5)

## 2016-08-11 LAB — LIPID PANEL
Cholesterol: 151 mg/dL (ref 0–200)
HDL: 71.4 mg/dL (ref >39.00)
LDL Cholesterol: 48 mg/dL (ref 0–99)
NonHDL: 79.74
Total CHOL/HDL Ratio: 2
Triglycerides: 157 mg/dL — ABNORMAL HIGH (ref 0.0–149.0)
VLDL: 31.4 mg/dL (ref 0.0–40.0)

## 2016-08-11 LAB — HEPATIC FUNCTION PANEL
ALT: 20 U/L (ref 0–35)
AST: 24 U/L (ref 0–37)
Albumin: 3.9 g/dL (ref 3.5–5.2)
Alkaline Phosphatase: 91 U/L (ref 39–117)
Bilirubin, Direct: 0.1 mg/dL (ref 0.0–0.3)
Total Bilirubin: 0.7 mg/dL (ref 0.2–1.2)
Total Protein: 6.7 g/dL (ref 6.0–8.3)

## 2016-08-11 LAB — HEMOGLOBIN A1C: Hgb A1c MFr Bld: 6.8 % — ABNORMAL HIGH (ref 4.6–6.5)

## 2016-08-11 LAB — URIC ACID: Uric Acid, Serum: 2.9 mg/dL (ref 2.4–7.0)

## 2016-08-11 LAB — BASIC METABOLIC PANEL
BUN: 15 mg/dL (ref 6–23)
CO2: 27 mEq/L (ref 19–32)
Calcium: 9.5 mg/dL (ref 8.4–10.5)
Chloride: 103 mEq/L (ref 96–112)
Creatinine, Ser: 0.91 mg/dL (ref 0.40–1.20)
GFR: 82.28 mL/min (ref >60.00)
Glucose, Bld: 100 mg/dL — ABNORMAL HIGH (ref 70–99)
Potassium: 3.7 mEq/L (ref 3.5–5.1)
Sodium: 138 mEq/L (ref 135–145)

## 2016-08-11 LAB — PTH, INTACT AND CALCIUM
Calcium: 9.2 mg/dL (ref 8.6–10.4)
PTH: 112 pg/mL — ABNORMAL HIGH (ref 14–64)

## 2016-08-11 LAB — TSH: TSH: 2.23 u[IU]/mL (ref 0.35–4.50)

## 2016-08-11 LAB — VITAMIN D 25 HYDROXY (VIT D DEFICIENCY, FRACTURES): VITD: 20.06 ng/mL — ABNORMAL LOW (ref 30.00–100.00)

## 2016-08-16 DIAGNOSIS — Z1212 Encounter for screening for malignant neoplasm of rectum: Secondary | ICD-10-CM | POA: Diagnosis not present

## 2016-08-16 DIAGNOSIS — Z1211 Encounter for screening for malignant neoplasm of colon: Secondary | ICD-10-CM | POA: Diagnosis not present

## 2016-08-17 ENCOUNTER — Encounter: Payer: Self-pay | Admitting: Endocrinology

## 2016-08-18 ENCOUNTER — Ambulatory Visit: Payer: Medicare Other | Admitting: Neurology

## 2016-08-20 ENCOUNTER — Ambulatory Visit (INDEPENDENT_AMBULATORY_CARE_PROVIDER_SITE_OTHER): Payer: Medicare Other | Admitting: *Deleted

## 2016-08-20 DIAGNOSIS — I6350 Cerebral infarction due to unspecified occlusion or stenosis of unspecified cerebral artery: Secondary | ICD-10-CM

## 2016-08-20 DIAGNOSIS — I2699 Other pulmonary embolism without acute cor pulmonale: Secondary | ICD-10-CM | POA: Diagnosis not present

## 2016-08-20 LAB — POCT INR: INR: 1.6

## 2016-08-24 LAB — COLOGUARD: Cologuard: NEGATIVE

## 2016-08-26 ENCOUNTER — Ambulatory Visit: Payer: Medicare Other | Admitting: Podiatry

## 2016-09-07 ENCOUNTER — Telehealth: Payer: Self-pay

## 2016-09-07 NOTE — Telephone Encounter (Signed)
I contacted the patient and advised we have scheduled her Bone Density for 09/23/2016 at 130 pm. Patient was advised of the location and phone number. ( [place] [phone]). Patient had no further questions at this time.

## 2016-09-16 ENCOUNTER — Telehealth: Payer: Self-pay | Admitting: Endocrinology

## 2016-09-17 ENCOUNTER — Ambulatory Visit (INDEPENDENT_AMBULATORY_CARE_PROVIDER_SITE_OTHER): Payer: Medicare Other | Admitting: *Deleted

## 2016-09-17 DIAGNOSIS — I2699 Other pulmonary embolism without acute cor pulmonale: Secondary | ICD-10-CM

## 2016-09-17 DIAGNOSIS — I6350 Cerebral infarction due to unspecified occlusion or stenosis of unspecified cerebral artery: Secondary | ICD-10-CM | POA: Diagnosis not present

## 2016-09-17 LAB — POCT INR: INR: 1.1

## 2016-09-23 ENCOUNTER — Ambulatory Visit (INDEPENDENT_AMBULATORY_CARE_PROVIDER_SITE_OTHER)
Admission: RE | Admit: 2016-09-23 | Discharge: 2016-09-23 | Disposition: A | Discharge status: Home / Self Care | Payer: Medicare Other | Source: Ambulatory Visit | Attending: Endocrinology | Admitting: Endocrinology

## 2016-09-23 DIAGNOSIS — M810 Age-related osteoporosis without current pathological fracture: Secondary | ICD-10-CM

## 2016-09-24 ENCOUNTER — Ambulatory Visit (INDEPENDENT_AMBULATORY_CARE_PROVIDER_SITE_OTHER): Payer: Medicare Other | Admitting: Pharmacist

## 2016-09-24 DIAGNOSIS — I6350 Cerebral infarction due to unspecified occlusion or stenosis of unspecified cerebral artery: Secondary | ICD-10-CM

## 2016-09-24 DIAGNOSIS — I2699 Other pulmonary embolism without acute cor pulmonale: Secondary | ICD-10-CM | POA: Diagnosis not present

## 2016-09-24 LAB — POCT INR: INR: 1.5

## 2016-09-27 ENCOUNTER — Other Ambulatory Visit: Payer: Self-pay | Admitting: Endocrinology

## 2016-09-27 MED ORDER — ALENDRONATE SODIUM 70 MG PO TABS: 70.0000 mg | ORAL_TABLET | ORAL | 3 refills | Status: DC

## 2016-09-30 ENCOUNTER — Ambulatory Visit (INDEPENDENT_AMBULATORY_CARE_PROVIDER_SITE_OTHER): Payer: Medicare Other | Admitting: *Deleted

## 2016-09-30 DIAGNOSIS — I2699 Other pulmonary embolism without acute cor pulmonale: Secondary | ICD-10-CM

## 2016-09-30 DIAGNOSIS — I6350 Cerebral infarction due to unspecified occlusion or stenosis of unspecified cerebral artery: Secondary | ICD-10-CM

## 2016-09-30 LAB — POCT INR: INR: 2.7

## 2016-10-14 ENCOUNTER — Other Ambulatory Visit: Payer: Self-pay | Admitting: Neurology

## 2016-10-15 ENCOUNTER — Ambulatory Visit (INDEPENDENT_AMBULATORY_CARE_PROVIDER_SITE_OTHER): Payer: Medicare Other

## 2016-10-15 DIAGNOSIS — I6350 Cerebral infarction due to unspecified occlusion or stenosis of unspecified cerebral artery: Secondary | ICD-10-CM

## 2016-10-15 DIAGNOSIS — I2699 Other pulmonary embolism without acute cor pulmonale: Secondary | ICD-10-CM

## 2016-10-15 LAB — POCT INR: INR: 1.7

## 2016-10-18 DIAGNOSIS — Z7901 Long term (current) use of anticoagulants: Secondary | ICD-10-CM | POA: Diagnosis not present

## 2016-10-18 DIAGNOSIS — M109 Gout, unspecified: Secondary | ICD-10-CM | POA: Diagnosis not present

## 2016-10-18 DIAGNOSIS — Z79899 Other long term (current) drug therapy: Secondary | ICD-10-CM | POA: Diagnosis not present

## 2016-10-18 DIAGNOSIS — Z9889 Other specified postprocedural states: Secondary | ICD-10-CM | POA: Diagnosis not present

## 2016-10-18 DIAGNOSIS — E785 Hyperlipidemia, unspecified: Secondary | ICD-10-CM | POA: Diagnosis not present

## 2016-10-18 DIAGNOSIS — I120 Hypertensive chronic kidney disease with stage 5 chronic kidney disease or end stage renal disease: Secondary | ICD-10-CM | POA: Diagnosis not present

## 2016-10-18 DIAGNOSIS — Z8673 Personal history of transient ischemic attack (TIA), and cerebral infarction without residual deficits: Secondary | ICD-10-CM | POA: Diagnosis not present

## 2016-10-18 DIAGNOSIS — I421 Obstructive hypertrophic cardiomyopathy: Secondary | ICD-10-CM | POA: Diagnosis not present

## 2016-10-18 DIAGNOSIS — R109 Unspecified abdominal pain: Secondary | ICD-10-CM | POA: Diagnosis not present

## 2016-10-18 DIAGNOSIS — M329 Systemic lupus erythematosus, unspecified: Secondary | ICD-10-CM | POA: Diagnosis not present

## 2016-10-18 DIAGNOSIS — Z940 Kidney transplant status: Secondary | ICD-10-CM | POA: Diagnosis not present

## 2016-10-18 DIAGNOSIS — R197 Diarrhea, unspecified: Secondary | ICD-10-CM | POA: Diagnosis not present

## 2016-10-21 ENCOUNTER — Other Ambulatory Visit: Payer: Self-pay | Admitting: Endocrinology

## 2016-10-22 ENCOUNTER — Ambulatory Visit: Payer: Medicare Other | Admitting: Neurology

## 2016-10-27 ENCOUNTER — Telehealth: Payer: Self-pay | Admitting: Pharmacist

## 2016-10-27 DIAGNOSIS — Z79899 Other long term (current) drug therapy: Secondary | ICD-10-CM | POA: Diagnosis not present

## 2016-10-27 DIAGNOSIS — E1122 Type 2 diabetes mellitus with diabetic chronic kidney disease: Secondary | ICD-10-CM | POA: Diagnosis not present

## 2016-10-27 DIAGNOSIS — Z86718 Personal history of other venous thrombosis and embolism: Secondary | ICD-10-CM | POA: Diagnosis not present

## 2016-10-27 DIAGNOSIS — Z992 Dependence on renal dialysis: Secondary | ICD-10-CM | POA: Diagnosis not present

## 2016-10-27 DIAGNOSIS — E042 Nontoxic multinodular goiter: Secondary | ICD-10-CM | POA: Diagnosis not present

## 2016-10-27 DIAGNOSIS — Z940 Kidney transplant status: Secondary | ICD-10-CM | POA: Diagnosis not present

## 2016-10-27 DIAGNOSIS — I4891 Unspecified atrial fibrillation: Secondary | ICD-10-CM | POA: Diagnosis not present

## 2016-10-27 DIAGNOSIS — N186 End stage renal disease: Secondary | ICD-10-CM | POA: Diagnosis not present

## 2016-10-27 DIAGNOSIS — Z8673 Personal history of transient ischemic attack (TIA), and cerebral infarction without residual deficits: Secondary | ICD-10-CM | POA: Diagnosis not present

## 2016-10-27 NOTE — Telephone Encounter (Signed)
Connie Ruiz from [facility] [place] to report that patient will need thyroidoidectomy - procedure to be scheduled by Dr. Nicolette Bang.   She will need to be off coumadin 5 days prior to procedure, given history of DVT, PE, and stroke patient will need lovenox bridge while off coumadin. Pt with appt on Friday for INR check. Will follow up with procedure date at that time.

## 2016-10-29 ENCOUNTER — Ambulatory Visit (INDEPENDENT_AMBULATORY_CARE_PROVIDER_SITE_OTHER): Payer: Medicare Other | Admitting: Pharmacist

## 2016-10-29 DIAGNOSIS — I2699 Other pulmonary embolism without acute cor pulmonale: Secondary | ICD-10-CM

## 2016-10-29 DIAGNOSIS — I6350 Cerebral infarction due to unspecified occlusion or stenosis of unspecified cerebral artery: Secondary | ICD-10-CM | POA: Diagnosis not present

## 2016-10-29 LAB — POCT INR: INR: 1.6

## 2016-11-11 ENCOUNTER — Ambulatory Visit (INDEPENDENT_AMBULATORY_CARE_PROVIDER_SITE_OTHER): Payer: Medicare Other | Admitting: *Deleted

## 2016-11-11 DIAGNOSIS — I2699 Other pulmonary embolism without acute cor pulmonale: Secondary | ICD-10-CM | POA: Diagnosis not present

## 2016-11-11 DIAGNOSIS — I6350 Cerebral infarction due to unspecified occlusion or stenosis of unspecified cerebral artery: Secondary | ICD-10-CM

## 2016-11-11 LAB — POCT INR: INR: 2.4

## 2016-11-18 ENCOUNTER — Other Ambulatory Visit: Payer: Self-pay | Admitting: Endocrinology

## 2016-11-18 ENCOUNTER — Other Ambulatory Visit: Payer: Self-pay | Admitting: Neurology

## 2016-11-25 ENCOUNTER — Ambulatory Visit (INDEPENDENT_AMBULATORY_CARE_PROVIDER_SITE_OTHER): Payer: Medicare Other

## 2016-11-25 DIAGNOSIS — I6350 Cerebral infarction due to unspecified occlusion or stenosis of unspecified cerebral artery: Secondary | ICD-10-CM | POA: Diagnosis not present

## 2016-11-25 DIAGNOSIS — I2699 Other pulmonary embolism without acute cor pulmonale: Secondary | ICD-10-CM

## 2016-11-25 LAB — POCT INR: INR: 2.1

## 2016-11-25 MED ORDER — ENOXAPARIN SODIUM 80 MG/0.8ML ~~LOC~~ SOLN: 80.0000 mg | Freq: Two times a day (BID) | SUBCUTANEOUS | 0 refills | Status: DC

## 2016-11-25 NOTE — Patient Instructions (Addendum)
11/27/16: Last dose of Coumadin.  11/28/16: No Coumadin or Lovenox.  11/29/16: Inject Lovenox 80mg  in the fatty abdominal tissue at least 2 inches from the belly button twice a day about 12 hours apart, 8am and 8pm rotate sites. No Coumadin.  11/30/16: Inject Lovenox in the fatty tissue every 12 hours, 8am and 8pm. No Coumadin.  12/01/16: Inject Lovenox in the fatty tissue every 12 hours, 8am and 8pm. No Coumadin.  12/02/16: Inject Lovenox in the fatty tissue in the morning at 8 am (No PM dose). No Coumadin.  12/03/16: Procedure Day - No Lovenox - Resume Coumadin in the evening or as directed by doctor (take an extra half tablet with usual dose for 2 days then resume normal dose).  12/04/16: Resume Lovenox inject in the fatty tissue every 12 hours and take Coumadin.  12/05/16: Inject Lovenox in the fatty tissue every 12 hours and take Coumadin.  12/06/16: Inject Lovenox in the fatty tissue every 12 hours and take Coumadin.  12/07/16: Inject Lovenox in the fatty tissue every 12 hours and take Coumadin.  12/08/16: Inject Lovenox in the fatty tissue every 12 hours and take Coumadin.  12/09/16: Coumadin appt to check INR.

## 2016-11-30 ENCOUNTER — Ambulatory Visit: Payer: Medicare Other | Admitting: Neurology

## 2016-11-30 DIAGNOSIS — Z9049 Acquired absence of other specified parts of digestive tract: Secondary | ICD-10-CM | POA: Diagnosis not present

## 2016-11-30 DIAGNOSIS — E042 Nontoxic multinodular goiter: Secondary | ICD-10-CM | POA: Diagnosis not present

## 2016-11-30 DIAGNOSIS — I10 Essential (primary) hypertension: Secondary | ICD-10-CM | POA: Diagnosis not present

## 2016-11-30 DIAGNOSIS — Z888 Allergy status to other drugs, medicaments and biological substances status: Secondary | ICD-10-CM | POA: Diagnosis not present

## 2016-11-30 DIAGNOSIS — R1310 Dysphagia, unspecified: Secondary | ICD-10-CM | POA: Diagnosis not present

## 2016-11-30 DIAGNOSIS — Z885 Allergy status to narcotic agent status: Secondary | ICD-10-CM | POA: Diagnosis not present

## 2016-11-30 DIAGNOSIS — Z940 Kidney transplant status: Secondary | ICD-10-CM | POA: Diagnosis not present

## 2016-11-30 DIAGNOSIS — I444 Left anterior fascicular block: Secondary | ICD-10-CM | POA: Diagnosis not present

## 2016-11-30 DIAGNOSIS — Z7901 Long term (current) use of anticoagulants: Secondary | ICD-10-CM | POA: Diagnosis not present

## 2016-11-30 DIAGNOSIS — Z91041 Radiographic dye allergy status: Secondary | ICD-10-CM | POA: Diagnosis not present

## 2016-11-30 DIAGNOSIS — Z886 Allergy status to analgesic agent status: Secondary | ICD-10-CM | POA: Diagnosis not present

## 2016-11-30 DIAGNOSIS — M329 Systemic lupus erythematosus, unspecified: Secondary | ICD-10-CM | POA: Diagnosis not present

## 2016-11-30 DIAGNOSIS — M109 Gout, unspecified: Secondary | ICD-10-CM | POA: Diagnosis not present

## 2016-11-30 DIAGNOSIS — I69351 Hemiplegia and hemiparesis following cerebral infarction affecting right dominant side: Secondary | ICD-10-CM | POA: Diagnosis not present

## 2016-11-30 DIAGNOSIS — Z8489 Family history of other specified conditions: Secondary | ICD-10-CM | POA: Diagnosis not present

## 2016-11-30 DIAGNOSIS — Z7983 Long term (current) use of bisphosphonates: Secondary | ICD-10-CM | POA: Diagnosis not present

## 2016-11-30 DIAGNOSIS — Z86711 Personal history of pulmonary embolism: Secondary | ICD-10-CM | POA: Diagnosis not present

## 2016-11-30 DIAGNOSIS — R9431 Abnormal electrocardiogram [ECG] [EKG]: Secondary | ICD-10-CM | POA: Diagnosis not present

## 2016-11-30 DIAGNOSIS — Z7952 Long term (current) use of systemic steroids: Secondary | ICD-10-CM | POA: Diagnosis not present

## 2016-11-30 DIAGNOSIS — E559 Vitamin D deficiency, unspecified: Secondary | ICD-10-CM | POA: Diagnosis not present

## 2016-11-30 DIAGNOSIS — Z882 Allergy status to sulfonamides status: Secondary | ICD-10-CM | POA: Diagnosis not present

## 2016-11-30 DIAGNOSIS — I69322 Dysarthria following cerebral infarction: Secondary | ICD-10-CM | POA: Diagnosis not present

## 2016-11-30 DIAGNOSIS — Z87440 Personal history of urinary (tract) infections: Secondary | ICD-10-CM | POA: Diagnosis not present

## 2016-11-30 DIAGNOSIS — Z9181 History of falling: Secondary | ICD-10-CM | POA: Diagnosis not present

## 2016-12-01 ENCOUNTER — Encounter: Payer: Self-pay | Admitting: Neurology

## 2016-12-02 DIAGNOSIS — I10 Essential (primary) hypertension: Secondary | ICD-10-CM | POA: Diagnosis not present

## 2016-12-03 DIAGNOSIS — E042 Nontoxic multinodular goiter: Secondary | ICD-10-CM | POA: Diagnosis not present

## 2016-12-07 ENCOUNTER — Other Ambulatory Visit: Payer: Self-pay

## 2016-12-07 MED ORDER — LOSARTAN POTASSIUM 100 MG PO TABS: 100.0000 mg | ORAL_TABLET | Freq: Every day | ORAL | 5 refills | Status: DC

## 2016-12-13 ENCOUNTER — Other Ambulatory Visit: Payer: Self-pay

## 2016-12-13 NOTE — Patient Outreach (Signed)
[name]) Care Management  12/13/2016  Connie Ruiz 06-Aug-1960 [id number]     Transition of Care Referral  Referral Date: 12/13/16 Referral Source: [facility] High Risk Discharge Report Date of Admission: 12/03/16 Diagnosis: multiple thyroid nodules s/p thyroidectomy Date of Discharge: 12/05/16 Facility: [place]: [facility] Medicare    Outreach attempt # 1 to patient. Spoke with patient who is a poor historian. She kept responding to most questions asked with "I don't know." She voices that she is doing okay since discharge home.Her only concern is that she has been experiencing some nausea. She reports she does not have anything in the home to take.Inquired if patient had contacted MD office to advise them of this and she reported no. Patient instructed to contact MD office to see what she can take to help alleviate symptoms. She voiced understanding and reported she would do so. Patient unsure of when her f/u appts are but states she has them written down somewhere. She voices that he family is assisting her as needed. She denies any issues with transportation. Sh reports that she has all her regular meds in the home and has no issues or concerns regarding them. No further RN CM needs or concerns at this time.    Plan: RN CM will notify [facility] administrative assistant of case status.   Enzo Montgomery, RN,BSN,CCM [place] Management Telephonic Care Management Coordinator Direct Phone: [phone] Toll Free: [phone] Fax: [phone]

## 2016-12-20 ENCOUNTER — Telehealth: Payer: Self-pay | Admitting: *Deleted

## 2016-12-20 ENCOUNTER — Other Ambulatory Visit: Payer: Self-pay | Admitting: Cardiovascular Disease

## 2016-12-20 MED ORDER — WARFARIN SODIUM 5 MG PO TABS: ORAL_TABLET | ORAL | 0 refills | Status: DC

## 2016-12-20 NOTE — Telephone Encounter (Signed)
Pt states she has taken all of the ordered Lovenox and has taken her coumadin but ran out 2 days ago She missed her scheduled appt in coumadin clinic July 5th Stressed to her the importance of keeping her scheduled appts and she states she cannot come in until Thursday Pt instructed will send in refill for 7 coumadin tablets and she states she can pick them up today and her dose is coumadin 5mg  daily and she states understanding

## 2016-12-20 NOTE — Telephone Encounter (Signed)
Sent to [employer] pt wanted to go to [facility]  So order cancelled at [employer] and sent to [facility]

## 2016-12-22 ENCOUNTER — Other Ambulatory Visit: Payer: Self-pay | Admitting: Endocrinology

## 2016-12-22 ENCOUNTER — Other Ambulatory Visit: Payer: Self-pay | Admitting: Neurology

## 2016-12-29 DIAGNOSIS — Z9089 Acquired absence of other organs: Secondary | ICD-10-CM | POA: Diagnosis not present

## 2016-12-29 DIAGNOSIS — Z4889 Encounter for other specified surgical aftercare: Secondary | ICD-10-CM | POA: Diagnosis not present

## 2017-01-04 ENCOUNTER — Ambulatory Visit (INDEPENDENT_AMBULATORY_CARE_PROVIDER_SITE_OTHER): Payer: Medicare Other | Admitting: *Deleted

## 2017-01-04 DIAGNOSIS — I6350 Cerebral infarction due to unspecified occlusion or stenosis of unspecified cerebral artery: Secondary | ICD-10-CM | POA: Diagnosis not present

## 2017-01-04 DIAGNOSIS — I2699 Other pulmonary embolism without acute cor pulmonale: Secondary | ICD-10-CM

## 2017-01-04 LAB — POCT INR: INR: 1

## 2017-01-04 MED ORDER — WARFARIN SODIUM 5 MG PO TABS: ORAL_TABLET | ORAL | 0 refills | Status: DC

## 2017-01-10 ENCOUNTER — Ambulatory Visit (INDEPENDENT_AMBULATORY_CARE_PROVIDER_SITE_OTHER): Payer: Medicare Other | Admitting: Pharmacist

## 2017-01-10 DIAGNOSIS — I2699 Other pulmonary embolism without acute cor pulmonale: Secondary | ICD-10-CM

## 2017-01-10 DIAGNOSIS — I6350 Cerebral infarction due to unspecified occlusion or stenosis of unspecified cerebral artery: Secondary | ICD-10-CM | POA: Diagnosis not present

## 2017-01-10 LAB — POCT INR: INR: 1.2

## 2017-01-17 ENCOUNTER — Ambulatory Visit (INDEPENDENT_AMBULATORY_CARE_PROVIDER_SITE_OTHER): Payer: Medicare Other | Admitting: *Deleted

## 2017-01-17 DIAGNOSIS — I2699 Other pulmonary embolism without acute cor pulmonale: Secondary | ICD-10-CM

## 2017-01-17 DIAGNOSIS — I6350 Cerebral infarction due to unspecified occlusion or stenosis of unspecified cerebral artery: Secondary | ICD-10-CM

## 2017-01-17 LAB — POCT INR: INR: 4.2

## 2017-01-25 ENCOUNTER — Other Ambulatory Visit: Payer: Self-pay

## 2017-01-25 MED ORDER — SERTRALINE HCL 100 MG PO TABS: 50.0000 mg | ORAL_TABLET | Freq: Every day | ORAL | 2 refills | Status: DC

## 2017-01-26 ENCOUNTER — Other Ambulatory Visit: Payer: Self-pay

## 2017-01-26 MED ORDER — ATORVASTATIN CALCIUM 40 MG PO TABS
40.0000 mg | ORAL_TABLET | Freq: Every day | ORAL | 5 refills | Status: DC
Start: 2017-01-26 — End: 2017-07-26

## 2017-02-03 ENCOUNTER — Other Ambulatory Visit: Payer: Self-pay | Admitting: Endocrinology

## 2017-02-03 ENCOUNTER — Other Ambulatory Visit: Payer: Self-pay | Admitting: Pharmacist

## 2017-02-03 MED ORDER — WARFARIN SODIUM 5 MG PO TABS: ORAL_TABLET | ORAL | 1 refills | Status: DC

## 2017-02-04 ENCOUNTER — Telehealth: Payer: Self-pay | Admitting: Neurology

## 2017-02-04 NOTE — Telephone Encounter (Signed)
Called and spoke with [name], [place] dghtr. Pts sister passed away last week, Pt has been more upset and will listen to no one now, is acting erratic, thinks she needs to be seen before Nov. Advsd her will find out on Tuesday if you recommend a sooner appt.

## 2017-02-04 NOTE — Telephone Encounter (Signed)
Patient's daughter called wanting to see if her mother can be seen sooner due to her behavior from Dementia? She has a Follow Up scheduled for November and on a wait list as well. Please Advise

## 2017-02-11 ENCOUNTER — Ambulatory Visit: Payer: Medicare Other | Admitting: Endocrinology

## 2017-02-22 ENCOUNTER — Telehealth: Payer: Self-pay | Admitting: Endocrinology

## 2017-02-22 NOTE — Telephone Encounter (Signed)
Called and left message for someone to call back in regards to patient. Dr. Loanne Drilling wanted to see patient before addressing this, so she has an appointment 10/18 at 10:15.

## 2017-02-22 NOTE — Telephone Encounter (Signed)
Routing to you °

## 2017-02-22 NOTE — Telephone Encounter (Signed)
[address] calling in reference to needing reasonable accomodation form for patient filled out ASAP. This form was faxed on 02/03/17 and re faxed this morning 02/22/17. Please call [employer] and advise.

## 2017-03-06 ENCOUNTER — Other Ambulatory Visit: Payer: Self-pay | Admitting: Endocrinology

## 2017-03-06 NOTE — Telephone Encounter (Signed)
Please refill x 1 Ov is due  

## 2017-03-07 ENCOUNTER — Other Ambulatory Visit: Payer: Self-pay

## 2017-03-07 MED ORDER — CALCITRIOL 0.25 MCG PO CAPS: ORAL_CAPSULE | ORAL | 1 refills | Status: DC

## 2017-03-24 ENCOUNTER — Ambulatory Visit (INDEPENDENT_AMBULATORY_CARE_PROVIDER_SITE_OTHER): Payer: Medicare Other | Admitting: Endocrinology

## 2017-03-24 ENCOUNTER — Encounter: Payer: Self-pay | Admitting: Endocrinology

## 2017-03-24 VITALS — BP 146/86 | HR 117 | Wt 169.8 lb

## 2017-03-24 DIAGNOSIS — E1122 Type 2 diabetes mellitus with diabetic chronic kidney disease: Secondary | ICD-10-CM

## 2017-03-24 DIAGNOSIS — Z992 Dependence on renal dialysis: Secondary | ICD-10-CM | POA: Diagnosis not present

## 2017-03-24 DIAGNOSIS — N186 End stage renal disease: Secondary | ICD-10-CM | POA: Diagnosis not present

## 2017-03-24 LAB — BASIC METABOLIC PANEL
BUN: 14 mg/dL (ref 6–23)
CO2: 26 mEq/L (ref 19–32)
Calcium: 9.5 mg/dL (ref 8.4–10.5)
Chloride: 105 mEq/L (ref 96–112)
Creatinine, Ser: 0.65 mg/dL (ref 0.40–1.20)
GFR: 121.06 mL/min (ref >60.00)
Glucose, Bld: 121 mg/dL — ABNORMAL HIGH (ref 70–99)
Potassium: 3.2 mEq/L — ABNORMAL LOW (ref 3.5–5.1)
Sodium: 139 mEq/L (ref 135–145)

## 2017-03-24 LAB — POCT GLYCOSYLATED HEMOGLOBIN (HGB A1C): Hemoglobin A1C: 7.2

## 2017-03-24 NOTE — Progress Notes (Signed)
Subjective:    Patient ID: Connie Ruiz, female    DOB: 27-Apr-1961, 56 y.o.   MRN: [id number]  HPI Pt returns for f/u of diabetes mellitus: DM type: 2 Dx'ed: approx 4584 Complications: ESRD (transplant) and CVA.   Therapy: no medication now.   GDM: once, in 1983 DKA: never Severe hypoglycemia: never.   Pancreatitis: once, in the 1980's.  Other: she took insulin for a few months, many years ago.  Interval history: pt states she feels well in general.   BP was normal at nephrol appt 5 mos ago.   Past Medical History:  Diagnosis Date  . Candida esophagitis ([place]) 11/12/2014  . CEREBROVASCULAR ACCIDENT, ACUTE 04/15/2010  . CLOSTRIDIUM DIFFICILE COLITIS, HX OF 08/21/2007  . CONGESTIVE HEART FAILURE 08/21/2007  . Current use of long term anticoagulation    Dr. Andree Elk, [facility]  . CVA 04/17/2010  . Depression    Dr. Andree Elk, [place], TYPE II 08/21/2007  . DVT, HX OF 08/21/2007  . GERD 08/21/2007  . GOUT 08/21/2007  . History of stroke with residual effects   . HYPERLIPIDEMIA 08/21/2007  . HYPERTENSION 08/21/2007   Dr. Andree Elk, [place], HX OF 08/22/2007   s/p renal transplant-Dr. Andree Elk, [place] 08/21/2007  . OSTEOPOROSIS 08/21/2007   [facility]  . Pulmonary embolism ([place]) 07/16/2010  . Renal failure   . RENAL INSUFFICIENCY 08/21/2007  . Right sided weakness   . Steroid-induced hyperglycemia 11/09/2014  . Tachycardia   . THYROID NODULE, LEFT 04/10/2009    Past Surgical History:  Procedure Laterality Date  . CHOLECYSTECTOMY    . ENTEROSCOPY N/A 11/11/2014   Procedure: ENTEROSCOPY;  Surgeon: Ladene Artist, MD;  Location: [facility] ENDOSCOPY;  Service: Endoscopy;  Laterality: N/A;  . KIDNEY TRANSPLANT Right 2009  . RENAL BIOPSY, OPEN  1981  . TUBAL LIGATION      Social History   Social History  . Marital status: Divorced    Spouse name: N/A  . Number of children: 1  . Years of education: N/A   Occupational History  .  DISABILITY Unemployed   Social History Main Topics  . Smoking status: Never Smoker  . Smokeless tobacco: Never Used  . Alcohol use No  . Drug use: No  . Sexual activity: No   Other Topics Concern  . Not on file   Social History Narrative   Retired   Regular exercise-no   Pt completed hs.     Current Outpatient Prescriptions on File Prior to Visit  Medication Sig Dispense Refill  . alendronate (FOSAMAX) 70 MG tablet Take 1 tablet (70 mg total) by mouth once a week. Take on Saturdays with a full glass of water on an empty stomach. 12 tablet 3  . ammonium lactate (AMLACTIN) 12 % cream Apply 1 g topically 2 (two) times daily as needed for dry skin.     Marland Kitchen atorvastatin (LIPITOR) 40 MG tablet Take 1 tablet (40 mg total) by mouth daily. 30 tablet 5  . Blood Glucose Monitoring Suppl (ONETOUCH VERIO IQ SYSTEM) W/DEVICE KIT Use to check blood sugar 1 time per day 1 kit 0  . calcitRIOL (ROCALTROL) 0.25 MCG capsule TAKE 1 CAPSULE(0.25 MCG) BY MOUTH DAILY 30 capsule 1  . diltiazem (TIAZAC) 240 MG 24 hr capsule Take 1 capsule (240 mg total) by mouth daily. 30 capsule 11  . donepezil (ARICEPT) 10 MG tablet TAKE 1 TABLET(10 MG) BY MOUTH AT BEDTIME 30 tablet  0  . enoxaparin (LOVENOX) 80 MG/0.8ML injection Inject 0.8 mLs (80 mg total) into the skin every 12 (twelve) hours. As instructed by Coumadin Clinic 20 Syringe 0  . esomeprazole (NEXIUM) 20 MG capsule Take 20 mg by mouth daily.    Marland Kitchen glucose blood (ONE TOUCH ULTRA TEST) test strip Use to check blood sugar 1 time per day 100 each 2  . losartan (COZAAR) 100 MG tablet Take 1 tablet (100 mg total) by mouth daily. 30 tablet 5  . mirtazapine (REMERON) 15 MG tablet TAKE 1 TABLET(15 MG) BY MOUTH AT BEDTIME 30 tablet 5  . mirtazapine (REMERON) 30 MG tablet Take 1 tablet (30 mg total) by mouth at bedtime. 30 tablet 1  . mycophenolate (CELLCEPT) 250 MG capsule Take 250 mg by mouth 2 (two) times daily.    Marland Kitchen nystatin (MYCOSTATIN) powder Apply 1 g topically 4  (four) times daily as needed. For yeast under breast    . ondansetron (ZOFRAN ODT) 4 MG disintegrating tablet 23m ODT q4 hours prn nausea/vomiting 15 tablet 0  . ONETOUCH DELICA LANCETS 308QMISC Use to check blood sugar 1 time per day 100 each 2  . oxybutynin (DITROPAN) 5 MG tablet Take 5 mg by mouth daily as needed for bladder spasms.     .Marland KitchenQUEtiapine (SEROQUEL) 25 MG tablet Take 1 tablet (25 mg total) by mouth at bedtime. 30 tablet 1  . sertraline (ZOLOFT) 100 MG tablet Take 0.5 tablets (50 mg total) by mouth daily. 15 tablet 2  . tacrolimus (PROGRAF) 1 MG capsule Take 1 mg by mouth 2 (two) times daily.     . traMADol (ULTRAM) 50 MG tablet Take 1 tablet (50 mg total) by mouth every 8 (eight) hours as needed. 30 tablet 0  . triamcinolone (KENALOG) 0.025 % cream Apply 1 application topically daily as needed (dry skin).     .Marland Kitchenwarfarin (COUMADIN) 5 MG tablet Take 556m(1 tablet) daily or as directed by coumadin clinic 30 tablet 1   No current facility-administered medications on file prior to visit.     Allergies  Allergen Reactions  . Oxycodone-Acetaminophen Shortness Of Breath and Nausea Only  . Propoxyphene N-Acetaminophen Shortness Of Breath and Nausea Only  . Sulfonamide Derivatives Shortness Of Breath and Nausea Only  . Adhesive [Tape]   . Codeine Nausea Only  . Other Other (See Comments)    No blood, Jehovaeh Witness   . Gabapentin Anxiety    twitching  . Latex Rash  . Metoprolol Rash  . Morphine And Related Rash    IV site on arm is red, patient reports this is improving.  NO shortness of breath reported.    Family History  Problem Relation Age of Onset  . Heart attack Mother   . Heart disease Father   . Asthma Sister   . Asthma Sister   . Asthma Daughter   . Cancer Maternal Grandfather        prostate  . Cancer Paternal Grandfather        colon    BP (!) 146/86   Pulse (!) 117   Wt 169 lb 12.8 oz (77 kg)   SpO2 97%   BMI 29.15 kg/m    Review of  Systems Denies dysuria.      Objective:   Physical Exam VITAL SIGNS:  See vs page GENERAL: no distress Pulses: foot pulses are intact bilaterally.   MSK: no deformity of the feet or ankles.  CV: no edema of the  legs or ankles Skin:  no ulcer on the feet or ankles.  normal color and temp on the feet and ankles Neuro: sensation is intact to touch on the feet and ankles, but decreased from normal Ext: There is bilateral onychomycosis of the toenails.    A1c=7.2%  Lab Results  Component Value Date   CREATININE 0.65 03/24/2017   BUN 14 03/24/2017   NA 139 03/24/2017   K 3.2 (L) 03/24/2017   CL 105 03/24/2017   CO2 26 03/24/2017       Assessment & Plan:  Type 2 DM, with CVA: worse.  She declines medication.  We discussed risks ESRD: better after transplant. Hypokalemia: mild.  We'll follow for now HTN: with likely situational component  Please continue the same medications Please come back for a follow-up appointment in 3 months

## 2017-03-24 NOTE — Patient Instructions (Signed)
Please let us know if you decide to take medication for the diabetes.  Please come back for a follow-up appointment in 3 months.

## 2017-03-25 ENCOUNTER — Ambulatory Visit: Payer: Medicare Other | Admitting: Endocrinology

## 2017-04-05 ENCOUNTER — Other Ambulatory Visit: Payer: Self-pay | Admitting: Cardiovascular Disease

## 2017-04-05 NOTE — Telephone Encounter (Signed)
Looks like one of yours -  Needs appt

## 2017-04-12 ENCOUNTER — Ambulatory Visit (INDEPENDENT_AMBULATORY_CARE_PROVIDER_SITE_OTHER): Payer: Medicare Other | Admitting: *Deleted

## 2017-04-12 DIAGNOSIS — Z5181 Encounter for therapeutic drug level monitoring: Secondary | ICD-10-CM | POA: Diagnosis not present

## 2017-04-12 DIAGNOSIS — I6350 Cerebral infarction due to unspecified occlusion or stenosis of unspecified cerebral artery: Secondary | ICD-10-CM

## 2017-04-12 DIAGNOSIS — I2699 Other pulmonary embolism without acute cor pulmonale: Secondary | ICD-10-CM

## 2017-04-12 LAB — POCT INR: INR: 2.3

## 2017-04-15 ENCOUNTER — Ambulatory Visit (INDEPENDENT_AMBULATORY_CARE_PROVIDER_SITE_OTHER): Payer: Medicare Other | Admitting: Neurology

## 2017-04-15 ENCOUNTER — Encounter: Payer: Self-pay | Admitting: Neurology

## 2017-04-15 VITALS — BP 140/90 | HR 120 | Ht 64.0 in | Wt 169.2 lb

## 2017-04-15 DIAGNOSIS — E785 Hyperlipidemia, unspecified: Secondary | ICD-10-CM | POA: Diagnosis not present

## 2017-04-15 DIAGNOSIS — I10 Essential (primary) hypertension: Secondary | ICD-10-CM

## 2017-04-15 DIAGNOSIS — F32A Depression, unspecified: Secondary | ICD-10-CM

## 2017-04-15 DIAGNOSIS — Z794 Long term (current) use of insulin: Secondary | ICD-10-CM | POA: Diagnosis not present

## 2017-04-15 DIAGNOSIS — F0151 Vascular dementia with behavioral disturbance: Secondary | ICD-10-CM | POA: Diagnosis not present

## 2017-04-15 DIAGNOSIS — I69359 Hemiplegia and hemiparesis following cerebral infarction affecting unspecified side: Secondary | ICD-10-CM | POA: Diagnosis not present

## 2017-04-15 DIAGNOSIS — F329 Major depressive disorder, single episode, unspecified: Secondary | ICD-10-CM

## 2017-04-15 DIAGNOSIS — E1122 Type 2 diabetes mellitus with diabetic chronic kidney disease: Secondary | ICD-10-CM | POA: Diagnosis not present

## 2017-04-15 DIAGNOSIS — I69320 Aphasia following cerebral infarction: Secondary | ICD-10-CM

## 2017-04-15 DIAGNOSIS — F01518 Vascular dementia, unspecified severity, with other behavioral disturbance: Secondary | ICD-10-CM

## 2017-04-15 NOTE — Patient Instructions (Signed)
1.  Continue donepezil 10mg  at bedtime 2.  Continue atorvastatin for cholesterol 3.  Try to work on getting your diabetes under control.  Stop sugary drinks and instead drink water 4.  Do not add extra salt to food 5.  Follow up in one year

## 2017-04-15 NOTE — Progress Notes (Addendum)
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  She is accompanied by her daughter and caregiver, who supplement history.   UPDATE: She is taking Aricept 45m daily 08/10/16 LDL 48 03/24/17 Hgb A1c 7.2  In A12-Sep-2024 her sister passed away.  While in the hospital where her sister was admitted, she started walking around the ICU naked.  She has been depressed.  She is still living by herself but she has a caregiver that comes 4 hours a day to help with cooking, cleaning and tending to ADLs such as bathing.  She has insisted on paying her own bills.  Her daughter states she occasionally makes a mistake.  She takes her medications.     HISTORY: She has history of significant cerebrovascular disease and stroke.  She last presented to the hospital on 06/14/13 with worsened left facial droop, right sided weakness and dysarthria.     For a few years, she has had some memory problems.  For example, she would often forget having just seen her children from time to time.  She also expresses that she feels people don't care about her.  She does report depression.  She still lives by herself.  She does not drive.  Since her stroke, she requires assistance in regards to her ADLs.  A home health aid helps her with dressing and bathing.  She has a pill box but she does not appear to be compliant with all of her medications, particularly the Lipitor.  Her daughter handles her finances.  She does not have problems recalling names or faces of people she knows (except once regarding a nephew in a picture).   [facility]   Prior medications:  Seroquel (effective for agitation), mirtazapine  MRI of the head from 06/14/13 showed remote left MCA infarct but no acute findings.  MRA of the head showed atherosclerotic changes in the anterior and posterior circulation with partial recanalization of the left MCA.  PAST MEDICAL HISTORY: Past Medical History:  Diagnosis Date  . [place]ida esophagitis [place] 11/12/2014  . CEREBROVASCULAR ACCIDENT, ACUTE 04/15/2010  . CLOSTRIDIUM DIFFICILE COLITIS, HX OF 08/21/2007  . CONGESTIVE HEART FAILURE 08/21/2007  . [facility]    [facility] CV[facility] 04/[facility]/2010  . [place]    [place] 08/[place]/2007  . DVT, HX OF 08/21/2007  . GERD 08/21/2007  . GOUT 08/21/2007  . History of stroke with residual effects   . HYPERLIPIDEMIA 08/21/2007  . HYPERTENSION 08/21/2007   [place] 08/[place]2007   [place]  . OSTEOPOROSIS 08/21/2007   [facility]  . [place] 07/[place]  . Renal failure   . RENAL INSUFFICIENCY 08/21/2007  . Right sided weakness   . Steroid-induced hyperglycemia 11/09/2014  . Tachycardia   . THYROID NODULE, LEFT 04/10/2009    MEDICATIONS: Current Outpatient Medications on File Prior to Visit  Medication Sig Dispense Refill  . alendronate (FOSAMAX) 70 MG tablet Take 1 tablet (70 mg total) by mouth  once a week. Take on Saturdays with a full glass of water on an empty stomach. 12 tablet 3  . ammonium lactate (AMLACTIN) 12 % cream Apply 1 g topically 2 (two) times daily as needed for dry skin.     Marland Kitchen atorvastatin (LIPITOR) 40 MG tablet Take 1 tablet (40 mg total) by mouth daily. 30 tablet 5  . Blood Glucose Monitoring Suppl (ONETOUCH VERIO IQ SYSTEM) W/DEVICE KIT Use to check blood sugar 1 time per day 1 kit 0  . calcitRIOL (ROCALTROL) 0.25 MCG capsule TAKE 1 CAPSULE(0.25  MCG) BY MOUTH DAILY 30 capsule 1  . diltiazem (TIAZAC) 240 MG 24 hr capsule Take 1 capsule (240 mg total) by mouth daily. 30 capsule 11  . donepezil (ARICEPT) 10 MG tablet TAKE 1 TABLET(10 MG) BY MOUTH AT BEDTIME 30 tablet 0  . enoxaparin (LOVENOX) 80 MG/0.8ML injection Inject 0.8 mLs (80 mg total) into the skin every 12 (twelve) hours. As instructed by Coumadin Clinic 20 Syringe 0  . esomeprazole (NEXIUM) 20 MG capsule Take 20 mg by mouth daily.    Marland Kitchen glucose blood (ONE TOUCH ULTRA TEST) test strip Use to check blood sugar 1 time per day 100 each 2  . losartan (COZAAR) 100 MG tablet Take 1 tablet (100 mg total) by mouth daily. 30 tablet 5  . mirtazapine (REMERON) 15 MG tablet TAKE 1 TABLET(15 MG) BY MOUTH AT BEDTIME 30 tablet 5  . mirtazapine (REMERON) 30 MG tablet Take 1 tablet (30 mg total) by mouth at bedtime. 30 tablet 1  . mycophenolate (CELLCEPT) 250 MG capsule Take 250 mg by mouth 2 (two) times daily.    Marland Kitchen nystatin (MYCOSTATIN) powder Apply 1 g topically 4 (four) times daily as needed. For yeast under breast    . ondansetron (ZOFRAN ODT) 4 MG disintegrating tablet 52m ODT q4 hours prn nausea/vomiting 15 tablet 0  . ONETOUCH DELICA LANCETS 382NMISC Use to check blood sugar 1 time per day 100 each 2  . oxybutynin (DITROPAN) 5 MG tablet Take 5 mg by mouth daily as needed for bladder spasms.     .Marland KitchenQUEtiapine (SEROQUEL) 25 MG tablet Take 1 tablet (25 mg total) by mouth at bedtime. 30 tablet 1  . sertraline (ZOLOFT) 100 MG tablet Take 0.5 tablets (50 mg total) by mouth daily. 15 tablet 2  . tacrolimus (PROGRAF) 1 MG capsule Take 1 mg by mouth 2 (two) times daily.     . traMADol (ULTRAM) 50 MG tablet Take 1 tablet (50 mg total) by mouth every 8 (eight) hours as needed. 30 tablet 0  . triamcinolone (KENALOG) 0.025 % cream Apply 1 application topically daily as needed (dry skin).     .Marland Kitchenwarfarin (COUMADIN) 5 MG tablet TAKE 1 TABLET BY MOUTH DAILY OR AS DIRECTED BY COUMADIN CLINIC 40 tablet 1   No  current facility-administered medications on file prior to visit.     ALLERGIES: Allergies  Allergen Reactions  . Oxycodone-Acetaminophen Shortness Of Breath and Nausea Only  . Propoxyphene N-Acetaminophen Shortness Of Breath and Nausea Only  . Sulfonamide Derivatives Shortness Of Breath and Nausea Only  . Adhesive [Tape]   . Codeine Nausea Only  . Other Other (See Comments)    No blood, Jehovaeh Witness   . Gabapentin Anxiety    twitching  . Latex Rash  . Metoprolol Rash  . Morphine And Related Rash    IV site on arm is red, patient reports this is improving.  NO shortness of breath  reported.    FAMILY HISTORY: Family History  Problem Relation Age of Onset  . Heart attack Mother   . Heart disease Father   . Asthma Sister   . Asthma Sister   . Asthma Daughter   . Cancer Maternal Grandfather        prostate  . Cancer Paternal Grandfather        colon    SOCIAL HISTORY: Social History   Socioeconomic History  . Marital status: Divorced    Spouse name: Not on file  . Number of children: 1  . Years of education: Not on file  . Highest education level: Not on file  Social Needs  . Financial resource strain: Not on file  . Food insecurity - worry: Not on file  . Food insecurity - inability: Not on file  . Transportation needs - medical: Not on file  . Transportation needs - non-medical: Not on file  Occupational History  . Occupation: DISABILITY    Employer: UNEMPLOYED  Tobacco Use  . Smoking status: Never Smoker  . Smokeless tobacco: Never Used  Substance and Sexual Activity  . Alcohol use: No    Alcohol/week: 0.0 oz  . Drug use: No  . Sexual activity: No  Other Topics Concern  . Not on file  Social History Narrative   Retired   Regular exercise-no   Pt completed hs.     REVIEW OF SYSTEMS: Constitutional: No fevers, chills, or sweats, no generalized fatigue, change in appetite Eyes: No visual changes, double vision, eye pain Ear, nose and throat: No  hearing loss, ear pain, nasal congestion, sore throat Cardiovascular: No chest pain, palpitations Respiratory:  No shortness of breath at rest or with exertion, wheezes GastrointestinaI: No nausea, vomiting, diarrhea, abdominal pain, fecal incontinence Genitourinary:  No dysuria, urinary retention or frequency Musculoskeletal:  No neck pain, back pain Integumentary: No rash, pruritus, skin lesions Neurological: as above Psychiatric: depression Endocrine: No palpitations, fatigue, diaphoresis, mood swings, change in appetite, change in weight, increased thirst Hematologic/Lymphatic:  No purpura, petechiae. Allergic/Immunologic: no itchy/runny eyes, nasal congestion, recent allergic reactions, rashes  PHYSICAL EXAM: Vitals:   04/15/17 1148  BP: 140/90  Pulse: (!) 120  SpO2: 97%   General: No acute distress.   Head:  Normocephalic/atraumatic Eyes:  Fundi examined but not visualized Neck: supple, no paraspinal tenderness, full range of motion Heart:  Regular rate and rhythm Lungs:  Clear to auscultation bilaterally Back: No paraspinal tenderness Neurological Exam:  alert and oriented to person, place, and time.  Attention span and concentration fair, delayed recall good and remote memory intact, fund of knowledge intact  Speech and language aphasic with some hesitancy in speech output and naming fluency but able to follow commands.  Speech is not dysarthric.    Montreal Cognitive Assessment  04/15/2017 11/19/2015 05/08/2015 05/29/2014  Visuospatial/ Executive (0/5) _0 Naming (0/3) _1 Attention: Read list of digits (0/2) _2 0  Attention: Read list of letters (0/1) 0 _3 Attention: Serial 7 subtraction starting at 100 (0/3) 0 0 0 3  Language: Repeat phrase (0/2) _4 0  Language : Fluency (0/1) 0 0 0 0  Abstraction (0/2) 0 1 0 1  Delayed Recall (0/5) 3 4 0 0  Orientation (0/6) _5 Total _6 Adjusted Score (based on education) - _7 right  ptosis, reduced facial sensation on  right (V1-V3) and mild right lower facial weakness.  Otherwise, CN II-XII intact. Bulk and tone normal, muscle strength 5-/5 right upper extremity and hip flexion.  Otherwise 5/5.  Deep tendon reflexes 2+ throughout, toes downgoing.  Finger to nose and heel to shin testing intact.  Wide-based wobbling gait.  Romberg negative.  IMPRESSION: 1.  Vascular dementia.  MOCA score variable year to year.  Possibly related to underlying depression as well. 2.  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  Type 2 diabetes 4.  Hyperlipidemia 5.  Hypertension  PLAN: 1.  Continue Aricept 40m at bedtime 2.  Continue sertraline for mood/sleep/agitation 3.  Continue anticoagulation 4.  Continue Lipitor 427m(LDL at goal of less than 70) 5.  Optimize blood pressure and glycemic control 6.  Follow up in one year.  25 minutes spent face to face with patient, over 50% spent discussing management.  AdMetta ClinesDO  CC:  SeRenato ShinMD

## 2017-05-08 ENCOUNTER — Other Ambulatory Visit: Payer: Self-pay | Admitting: Endocrinology

## 2017-06-03 ENCOUNTER — Other Ambulatory Visit: Payer: Self-pay

## 2017-06-03 MED ORDER — LOSARTAN POTASSIUM 100 MG PO TABS: 100.0000 mg | ORAL_TABLET | Freq: Every day | ORAL | 5 refills | Status: DC

## 2017-06-09 ENCOUNTER — Other Ambulatory Visit: Payer: Self-pay | Admitting: Endocrinology

## 2017-06-14 ENCOUNTER — Ambulatory Visit (INDEPENDENT_AMBULATORY_CARE_PROVIDER_SITE_OTHER): Payer: Medicare Other | Admitting: *Deleted

## 2017-06-14 DIAGNOSIS — Z5181 Encounter for therapeutic drug level monitoring: Secondary | ICD-10-CM | POA: Diagnosis not present

## 2017-06-14 DIAGNOSIS — I2699 Other pulmonary embolism without acute cor pulmonale: Secondary | ICD-10-CM | POA: Diagnosis not present

## 2017-06-14 DIAGNOSIS — I6350 Cerebral infarction due to unspecified occlusion or stenosis of unspecified cerebral artery: Secondary | ICD-10-CM

## 2017-06-14 LAB — PROTIME-INR
INR: 7.5 (ref 0.8–1.2)
Prothrombin Time: 80.4 s — ABNORMAL HIGH (ref 9.1–12.0)

## 2017-06-14 LAB — POCT INR: INR: 7

## 2017-06-15 ENCOUNTER — Other Ambulatory Visit: Payer: Self-pay

## 2017-06-15 ENCOUNTER — Telehealth: Payer: Self-pay | Admitting: Endocrinology

## 2017-06-15 MED ORDER — LEVOTHYROXINE SODIUM 125 MCG PO TABS: 125.0000 ug | ORAL_TABLET | Freq: Every day | ORAL | 1 refills | Status: DC

## 2017-06-15 NOTE — Telephone Encounter (Signed)
I called patient & she stated that she had a thyroidectomy. She needed to be on synthroid but not on current med llist. She stated she takes .0125 mg? Should this be prescribed & sent to pharmacy?

## 2017-06-15 NOTE — Telephone Encounter (Signed)
I have sent.

## 2017-06-15 NOTE — Telephone Encounter (Signed)
Patient needs script for Synthroid sent to [facility] on [address]

## 2017-06-15 NOTE — Telephone Encounter (Signed)
Please refill x 1 Ov is due  

## 2017-06-18 ENCOUNTER — Other Ambulatory Visit: Payer: Self-pay | Admitting: Neurology

## 2017-06-21 ENCOUNTER — Ambulatory Visit (INDEPENDENT_AMBULATORY_CARE_PROVIDER_SITE_OTHER): Payer: Medicare Other

## 2017-06-21 DIAGNOSIS — I2699 Other pulmonary embolism without acute cor pulmonale: Secondary | ICD-10-CM | POA: Diagnosis not present

## 2017-06-21 DIAGNOSIS — Z7901 Long term (current) use of anticoagulants: Secondary | ICD-10-CM

## 2017-06-21 DIAGNOSIS — I6350 Cerebral infarction due to unspecified occlusion or stenosis of unspecified cerebral artery: Secondary | ICD-10-CM

## 2017-06-21 DIAGNOSIS — Z5181 Encounter for therapeutic drug level monitoring: Secondary | ICD-10-CM | POA: Diagnosis not present

## 2017-06-21 LAB — POCT INR: INR: 1.9

## 2017-06-21 NOTE — Patient Instructions (Signed)
Description    Continue on same dosage 1 tablet daily.  Recheck INR in 1 week.  Call coumadin clinic with any questions  [phone]

## 2017-06-24 ENCOUNTER — Other Ambulatory Visit: Payer: Self-pay

## 2017-06-24 ENCOUNTER — Ambulatory Visit: Payer: Medicare Other | Admitting: Endocrinology

## 2017-06-24 MED ORDER — LEVOTHYROXINE SODIUM 125 MCG PO TABS: 125.0000 ug | ORAL_TABLET | Freq: Every day | ORAL | 3 refills | Status: DC

## 2017-06-27 ENCOUNTER — Ambulatory Visit (INDEPENDENT_AMBULATORY_CARE_PROVIDER_SITE_OTHER): Payer: Medicare Other | Admitting: Endocrinology

## 2017-06-27 ENCOUNTER — Encounter: Payer: Self-pay | Admitting: Endocrinology

## 2017-06-27 VITALS — BP 152/90 | HR 88 | Wt 173.6 lb

## 2017-06-27 DIAGNOSIS — Z992 Dependence on renal dialysis: Secondary | ICD-10-CM | POA: Diagnosis not present

## 2017-06-27 DIAGNOSIS — Z0000 Encounter for general adult medical examination without abnormal findings: Secondary | ICD-10-CM

## 2017-06-27 DIAGNOSIS — F32A Depression, unspecified: Secondary | ICD-10-CM

## 2017-06-27 DIAGNOSIS — M329 Systemic lupus erythematosus, unspecified: Secondary | ICD-10-CM

## 2017-06-27 DIAGNOSIS — Z23 Encounter for immunization: Secondary | ICD-10-CM | POA: Diagnosis not present

## 2017-06-27 DIAGNOSIS — N186 End stage renal disease: Secondary | ICD-10-CM

## 2017-06-27 DIAGNOSIS — F329 Major depressive disorder, single episode, unspecified: Secondary | ICD-10-CM

## 2017-06-27 DIAGNOSIS — E1122 Type 2 diabetes mellitus with diabetic chronic kidney disease: Secondary | ICD-10-CM

## 2017-06-27 LAB — CBC WITH DIFFERENTIAL/PLATELET
Basophils Absolute: 0 10*3/uL (ref 0.0–0.1)
Basophils Relative: 0.5 % (ref 0.0–3.0)
Eosinophils Absolute: 0 10*3/uL (ref 0.0–0.7)
Eosinophils Relative: 0.4 % (ref 0.0–5.0)
HCT: 43.2 % (ref 36.0–46.0)
Hemoglobin: 14.2 g/dL (ref 12.0–15.0)
Lymphocytes Relative: 24.2 % (ref 12.0–46.0)
Lymphs Abs: 2 10*3/uL (ref 0.7–4.0)
MCHC: 32.8 g/dL (ref 30.0–36.0)
MCV: 89.6 fl (ref 78.0–100.0)
Monocytes Absolute: 0.5 10*3/uL (ref 0.1–1.0)
Monocytes Relative: 6.3 % (ref 3.0–12.0)
Neutro Abs: 5.6 10*3/uL (ref 1.4–7.7)
Neutrophils Relative %: 68.6 % (ref 43.0–77.0)
Platelets: 211 10*3/uL (ref 150.0–400.0)
RBC: 4.83 Mil/uL (ref 3.87–5.11)
RDW: 14.8 % (ref 11.5–15.5)
WBC: 8.2 10*3/uL (ref 4.0–10.5)

## 2017-06-27 LAB — LIPID PANEL
Cholesterol: 166 mg/dL (ref 0–200)
HDL: 81.8 mg/dL (ref >39.00)
LDL Cholesterol: 84 mg/dL (ref 0–99)
NonHDL: 83.97
Total CHOL/HDL Ratio: 2
Triglycerides: 0 mg/dL (ref 0.0–149.0)
VLDL: 0 mg/dL (ref 0.0–40.0)

## 2017-06-27 LAB — TSH: TSH: 25.76 u[IU]/mL — ABNORMAL HIGH (ref 0.35–4.50)

## 2017-06-27 LAB — POCT GLYCOSYLATED HEMOGLOBIN (HGB A1C): Hemoglobin A1C: 7.6

## 2017-06-27 MED ORDER — METFORMIN HCL ER 500 MG PO TB24: 500.0000 mg | ORAL_TABLET | Freq: Every day | ORAL | 3 refills | Status: DC

## 2017-06-27 NOTE — Patient Instructions (Addendum)
blood tests are requested for you today.  We'll let you know about the results. I have sent a prescription to your pharmacy, for the diabetes.   Please see a specialist, for the depression.  you will receive a phone call, about a day and time for an appointment good diet and exercise significantly improve the control of your diabetes.  please let me know if you wish to be referred to a dietician.   Please consider these measures for your health:  minimize alcohol.  Do not use tobacco products.  Have a colonoscopy at least every 10 years from age 20.  Women should have an annual mammogram from age 95.  Keep firearms safely stored.  Always use seat belts.  have working smoke alarms in your home.  See an eye doctor and dentist regularly.  Never drive under the influence of alcohol or drugs (including prescription drugs).   It is critically important to prevent falling down (keep floor areas well-lit, dry, and free of loose objects.  If you have a cane, walker, or wheelchair, you should use it, even for short trips around the house.  Wear flat-soled shoes.  Also, try not to rush).  Please come back for a follow-up appointment in 6 months.

## 2017-06-27 NOTE — Progress Notes (Signed)
Subjective:    Patient ID: Connie Ruiz, female    DOB: 12/09/1960, 57 y.o.   MRN: [id number]  HPI Pt returns for f/u of diabetes mellitus: DM type: 2 Dx'ed: approx 2778 Complications: ESRD (transplant) and CVA.   Therapy: no medication now.   GDM: once, in 1983 DKA: never Severe hypoglycemia: never.   Pancreatitis: once, in the 1980's.  Other: she took insulin for a few months, many years ago.  Interval history: pt states she feels well in general.   Pt says she needs SLE blood test done today.   Past Medical History:  Diagnosis Date  . Candida esophagitis ([place]) 11/12/2014  . CEREBROVASCULAR ACCIDENT, ACUTE 04/15/2010  . CLOSTRIDIUM DIFFICILE COLITIS, HX OF 08/21/2007  . CONGESTIVE HEART FAILURE 08/21/2007  . Current use of long term anticoagulation    Dr. Andree Elk, [facility]  . CVA 04/17/2010  . Depression    Dr. Andree Elk, [place], TYPE II 08/21/2007  . DVT, HX OF 08/21/2007  . GERD 08/21/2007  . GOUT 08/21/2007  . History of stroke with residual effects   . HYPERLIPIDEMIA 08/21/2007  . HYPERTENSION 08/21/2007   Dr. Andree Elk, [place], HX OF 08/22/2007   s/p renal transplant-Dr. Andree Elk, [place] 08/21/2007  . OSTEOPOROSIS 08/21/2007   Rheumatol at [facility]  . Pulmonary embolism ([place]) 07/16/2010  . Renal failure   . RENAL INSUFFICIENCY 08/21/2007  . Right sided weakness   . Steroid-induced hyperglycemia 11/09/2014  . Tachycardia   . THYROID NODULE, LEFT 04/10/2009    Past Surgical History:  Procedure Laterality Date  . CHOLECYSTECTOMY    . ENTEROSCOPY N/A 11/11/2014   Procedure: ENTEROSCOPY;  Surgeon: Ladene Artist, MD;  Location: [facility];  Service: Endoscopy;  Laterality: N/A;  . KIDNEY TRANSPLANT Right 2009  . RENAL BIOPSY, OPEN  1981  . TUBAL LIGATION      Social History   Socioeconomic History  . Marital status: Divorced    Spouse name: Not on file  . Number of children: 1  . Years of education: Not on file  . Highest  education level: Not on file  Social Needs  . Financial resource strain: Not on file  . Food insecurity - worry: Not on file  . Food insecurity - inability: Not on file  . Transportation needs - medical: Not on file  . Transportation needs - non-medical: Not on file  Occupational History  . Occupation: DISABILITY    Employer: UNEMPLOYED  Tobacco Use  . Smoking status: Never Smoker  . Smokeless tobacco: Never Used  Substance and Sexual Activity  . Alcohol use: No    Alcohol/week: 0.0 oz  . Drug use: No  . Sexual activity: No  Other Topics Concern  . Not on file  Social History Narrative   Retired   Regular exercise-no   Pt completed hs.     Current Outpatient Medications on File Prior to Visit  Medication Sig Dispense Refill  . alendronate (FOSAMAX) 70 MG tablet Take 1 tablet (70 mg total) by mouth once a week. Take on Saturdays with a full glass of water on an empty stomach. 12 tablet 3  . ammonium lactate (AMLACTIN) 12 % cream Apply 1 g topically 2 (two) times daily as needed for dry skin.     Marland Kitchen atorvastatin (LIPITOR) 40 MG tablet Take 1 tablet (40 mg total) by mouth daily. 30 tablet 5  . Blood Glucose Monitoring Suppl (ONETOUCH VERIO IQ SYSTEM)  W/DEVICE KIT Use to check blood sugar 1 time per day 1 kit 0  . calcitRIOL (ROCALTROL) 0.25 MCG capsule TAKE 1 CAPSULE(0.25 MCG) BY MOUTH DAILY 30 capsule 0  . diltiazem (TIAZAC) 240 MG 24 hr capsule Take 1 capsule (240 mg total) by mouth daily. 30 capsule 11  . donepezil (ARICEPT) 10 MG tablet TAKE 1 TABLET(10 MG) BY MOUTH AT BEDTIME 30 tablet 0  . enoxaparin (LOVENOX) 80 MG/0.8ML injection Inject 0.8 mLs (80 mg total) into the skin every 12 (twelve) hours. As instructed by Coumadin Clinic 20 Syringe 0  . esomeprazole (NEXIUM) 20 MG capsule Take 20 mg by mouth daily.    Marland Kitchen glucose blood (ONE TOUCH ULTRA TEST) test strip Use to check blood sugar 1 time per day 100 each 2  . levothyroxine (SYNTHROID) 125 MCG tablet Take 1 tablet (125 mcg  total) by mouth daily before breakfast. 30 tablet 1  . levothyroxine (SYNTHROID, LEVOTHROID) 125 MCG tablet Take 1 tablet (125 mcg total) by mouth daily. 90 tablet 3  . losartan (COZAAR) 100 MG tablet Take 1 tablet (100 mg total) by mouth daily. 30 tablet 5  . mirtazapine (REMERON) 15 MG tablet TAKE 1 TABLET(15 MG) BY MOUTH AT BEDTIME 30 tablet 3  . mirtazapine (REMERON) 30 MG tablet Take 1 tablet (30 mg total) by mouth at bedtime. 30 tablet 1  . mycophenolate (CELLCEPT) 250 MG capsule Take 250 mg by mouth 2 (two) times daily.    Marland Kitchen nystatin (MYCOSTATIN) powder Apply 1 g topically 4 (four) times daily as needed. For yeast under breast    . ondansetron (ZOFRAN ODT) 4 MG disintegrating tablet 103m ODT q4 hours prn nausea/vomiting 15 tablet 0  . ONETOUCH DELICA LANCETS 331SMISC Use to check blood sugar 1 time per day 100 each 2  . oxybutynin (DITROPAN) 5 MG tablet Take 5 mg by mouth daily as needed for bladder spasms.     .Marland KitchenQUEtiapine (SEROQUEL) 25 MG tablet Take 1 tablet (25 mg total) by mouth at bedtime. 30 tablet 1  . sertraline (ZOLOFT) 100 MG tablet Take 0.5 tablets (50 mg total) by mouth daily. 15 tablet 2  . tacrolimus (PROGRAF) 1 MG capsule Take 1 mg by mouth 2 (two) times daily.     . traMADol (ULTRAM) 50 MG tablet Take 1 tablet (50 mg total) by mouth every 8 (eight) hours as needed. 30 tablet 0  . triamcinolone (KENALOG) 0.025 % cream Apply 1 application topically daily as needed (dry skin).     .Marland Kitchenwarfarin (COUMADIN) 5 MG tablet TAKE 1 TABLET BY MOUTH DAILY OR AS DIRECTED BY COUMADIN CLINIC 40 tablet 1   No current facility-administered medications on file prior to visit.     Allergies  Allergen Reactions  . Oxycodone-Acetaminophen Shortness Of Breath and Nausea Only  . Propoxyphene N-Acetaminophen Shortness Of Breath and Nausea Only  . Sulfonamide Derivatives Shortness Of Breath and Nausea Only  . Adhesive [Tape]   . Codeine Nausea Only  . Other Other (See Comments)    No blood,  [place]   . Gabapentin Anxiety    twitching  . Latex Rash  . Metoprolol Rash  . Morphine And Related Rash    IV site on arm is red, patient reports this is improving.  NO shortness of breath reported.    Family History  Problem Relation Age of Onset  . Heart attack Mother   . Heart disease Father   . Asthma Sister   . Asthma  Sister   . Asthma Daughter   . Cancer Maternal Grandfather        prostate  . Cancer Paternal Grandfather        colon    BP (!) 152/90 (BP Location: Left Arm, Patient Position: Sitting, Cuff Size: Normal)   Pulse 88   Wt 173 lb 9.6 oz (78.7 kg)   BMI 29.80 kg/m    Review of Systems No change in chronic leg edema.     Objective:   Physical Exam VITAL SIGNS:  See vs page GENERAL: no distress.  Pulses: foot pulses are intact bilaterally.   MSK: no deformity of the feet or ankles.  CV: trace bilat edema of the legs.   Skin:  no ulcer on the feet or ankles.  normal color and temp on the feet and ankles.  Neuro: sensation is intact to touch on the feet and ankles, but decreased from normal.  Ext: There is bilateral onychomycosis of the toenails.   Lab Results  Component Value Date   HGBA1C 7.6 06/27/2017   Lab Results  Component Value Date   CREATININE 0.65 03/24/2017   BUN 14 03/24/2017   NA 139 03/24/2017   K 3.2 (L) 03/24/2017   CL 105 03/24/2017   CO2 26 03/24/2017    ecg machine is not working today    Assessment & Plan:  Type 2 DM: worse:  I have sent a prescription to your pharmacy, to add metformin Dyslipidemia: recheck today Hypothyroidism: recheck today  Subjective:   Patient here for Medicare annual wellness visit and management of other chronic and acute problems.     Risk factors: advanced age    60 of Physicians Providing Medical Care to Patient:  See "snapshot"   Activities of Daily Living: In your present state of health, do you have any difficulty performing the following activities (lives alone,  but dtr lives in [place] also; caregiver is here--Bonita Jimmye Norman)?:  Preparing food and eating?: yes  Bathing yourself: yes Getting dressed: yes  Using the toilet: yes Moving around from place to place: yes  In the past year have you fallen or had a near fall?: yes    Home Safety: Has smoke detector and wears seat belts. No firearms.   Opioid Use: none   Diet and Exercise  Current exercise habits: limited by health probs--uses cane Dietary issues discussed: pt reports a healthy diet   Depression Screen  Q1: Over the past two weeks, have you felt down, depressed or hopeless?no  Q2: Over the past two weeks, have you felt little interest or pleasure in doing things? no   The following portions of the patient's history were reviewed and updated as appropriate: allergies, current medications, past family history, past medical history, past social history, past surgical history and problem list.   Review of Systems  Denies hearing loss, and visual loss Objective:   Vision:  Pt is ref to opthalmologist, so she declines [facility] today Hearing: grossly normal Body mass index:  See vs page Msk: pt easily and quickly performs "get-up-and-go" from a sitting position. Cognitive Impairment Assessment: cognition, memory and judgment appear normal.  remembers 3/3 at 5 minutes.  excellent recall.  can easily read and write a sentence.  alert and oriented x 3   Assessment:   Medicare wellness utd on preventive parameters    Plan:   During the course of the visit the patient was educated and counseled about appropriate screening and preventive services including:  Fall prevention is advised today  Screening mammography is ordered Bone densitometry screening is UTD Diabetes screening  Nutrition counseling is offered  advanced directives/end of life addressed today:  see healthcare directives hyperlink  Vaccines are updated as needed  Patient Instructions (the written plan) was given to the  patient.

## 2017-06-27 NOTE — Progress Notes (Signed)
we discussed code status.  pt requests full code. 

## 2017-06-28 ENCOUNTER — Ambulatory Visit (INDEPENDENT_AMBULATORY_CARE_PROVIDER_SITE_OTHER): Payer: Medicare Other

## 2017-06-28 DIAGNOSIS — I2699 Other pulmonary embolism without acute cor pulmonale: Secondary | ICD-10-CM | POA: Diagnosis not present

## 2017-06-28 DIAGNOSIS — Z7901 Long term (current) use of anticoagulants: Secondary | ICD-10-CM

## 2017-06-28 DIAGNOSIS — I6350 Cerebral infarction due to unspecified occlusion or stenosis of unspecified cerebral artery: Secondary | ICD-10-CM

## 2017-06-28 LAB — ANA+ENA+DNA/DS+SCL 70+SJOSSA/B
ANA Titer 1: NEGATIVE
ENA RNP Ab: <0.2 AI (ref 0.0–0.9)
ENA SM Ab Ser-aCnc: <0.2 AI (ref 0.0–0.9)
ENA SSA (RO) Ab: <0.2 AI (ref 0.0–0.9)
ENA SSB (LA) Ab: <0.2 AI (ref 0.0–0.9)
Scleroderma SCL-70: 0.6 AI (ref 0.0–0.9)
dsDNA Ab: 1 IU/mL (ref 0–9)

## 2017-06-28 LAB — POCT INR: INR: 4.4

## 2017-06-28 NOTE — Patient Instructions (Signed)
Description   Skip today's dose, then change your dose to 1 tablet daily except 1/2 tablets on Wednesdays and Saturdays.  Recheck INR in 10 days.   Call coumadin clinic with any questions  [phone]

## 2017-06-29 ENCOUNTER — Ambulatory Visit: Payer: Medicare Other | Admitting: Endocrinology

## 2017-07-07 ENCOUNTER — Other Ambulatory Visit: Payer: Self-pay | Admitting: Endocrinology

## 2017-07-08 ENCOUNTER — Ambulatory Visit (INDEPENDENT_AMBULATORY_CARE_PROVIDER_SITE_OTHER): Payer: Medicare Other | Admitting: *Deleted

## 2017-07-08 DIAGNOSIS — Z5181 Encounter for therapeutic drug level monitoring: Secondary | ICD-10-CM

## 2017-07-08 DIAGNOSIS — Z86718 Personal history of other venous thrombosis and embolism: Secondary | ICD-10-CM | POA: Diagnosis not present

## 2017-07-08 DIAGNOSIS — I2699 Other pulmonary embolism without acute cor pulmonale: Secondary | ICD-10-CM

## 2017-07-08 DIAGNOSIS — Z7901 Long term (current) use of anticoagulants: Secondary | ICD-10-CM

## 2017-07-08 DIAGNOSIS — I6930 Unspecified sequelae of cerebral infarction: Secondary | ICD-10-CM

## 2017-07-08 DIAGNOSIS — I6350 Cerebral infarction due to unspecified occlusion or stenosis of unspecified cerebral artery: Secondary | ICD-10-CM | POA: Diagnosis not present

## 2017-07-08 DIAGNOSIS — G459 Transient cerebral ischemic attack, unspecified: Secondary | ICD-10-CM

## 2017-07-08 LAB — POCT INR: INR: 3.1

## 2017-07-08 NOTE — Patient Instructions (Signed)
Description   Change coumadin dose to  1 tablet daily except 1/2 tablets on  Mondays  Wednesdays and Fridays.  Recheck INR in 2 weeks.   Call coumadin clinic with any questions  [phone]

## 2017-07-13 DIAGNOSIS — E891 Postprocedural hypoinsulinemia: Secondary | ICD-10-CM | POA: Diagnosis not present

## 2017-07-13 DIAGNOSIS — Z7952 Long term (current) use of systemic steroids: Secondary | ICD-10-CM | POA: Diagnosis not present

## 2017-07-13 DIAGNOSIS — Z4822 Encounter for aftercare following kidney transplant: Secondary | ICD-10-CM | POA: Diagnosis not present

## 2017-07-13 DIAGNOSIS — Z5181 Encounter for therapeutic drug level monitoring: Secondary | ICD-10-CM | POA: Diagnosis not present

## 2017-07-13 DIAGNOSIS — R197 Diarrhea, unspecified: Secondary | ICD-10-CM | POA: Diagnosis not present

## 2017-07-13 DIAGNOSIS — M109 Gout, unspecified: Secondary | ICD-10-CM | POA: Diagnosis not present

## 2017-07-13 DIAGNOSIS — Z940 Kidney transplant status: Secondary | ICD-10-CM | POA: Diagnosis not present

## 2017-07-13 DIAGNOSIS — Z792 Long term (current) use of antibiotics: Secondary | ICD-10-CM | POA: Diagnosis not present

## 2017-07-13 DIAGNOSIS — M329 Systemic lupus erythematosus, unspecified: Secondary | ICD-10-CM | POA: Diagnosis not present

## 2017-07-13 DIAGNOSIS — Z7901 Long term (current) use of anticoagulants: Secondary | ICD-10-CM | POA: Diagnosis not present

## 2017-07-13 DIAGNOSIS — E890 Postprocedural hypothyroidism: Secondary | ICD-10-CM | POA: Diagnosis not present

## 2017-07-13 DIAGNOSIS — E785 Hyperlipidemia, unspecified: Secondary | ICD-10-CM | POA: Diagnosis not present

## 2017-07-13 DIAGNOSIS — I10 Essential (primary) hypertension: Secondary | ICD-10-CM | POA: Diagnosis not present

## 2017-07-13 DIAGNOSIS — Z79899 Other long term (current) drug therapy: Secondary | ICD-10-CM | POA: Diagnosis not present

## 2017-07-13 DIAGNOSIS — Z8673 Personal history of transient ischemic attack (TIA), and cerebral infarction without residual deficits: Secondary | ICD-10-CM | POA: Diagnosis not present

## 2017-07-13 DIAGNOSIS — I421 Obstructive hypertrophic cardiomyopathy: Secondary | ICD-10-CM | POA: Diagnosis not present

## 2017-07-13 DIAGNOSIS — R109 Unspecified abdominal pain: Secondary | ICD-10-CM | POA: Diagnosis not present

## 2017-07-16 ENCOUNTER — Inpatient Hospital Stay (HOSPITAL_COMMUNITY)
Admission: EM | Admit: 2017-07-16 | Discharge: 2017-07-20 | DRG: 193 | Disposition: A | Discharge status: Home Health | Payer: Medicare Other | Attending: Internal Medicine | Admitting: Internal Medicine

## 2017-07-16 ENCOUNTER — Emergency Department (HOSPITAL_COMMUNITY): Payer: Medicare Other

## 2017-07-16 ENCOUNTER — Encounter (HOSPITAL_COMMUNITY): Payer: Self-pay | Admitting: *Deleted

## 2017-07-16 DIAGNOSIS — R112 Nausea with vomiting, unspecified: Secondary | ICD-10-CM | POA: Diagnosis not present

## 2017-07-16 DIAGNOSIS — Z79899 Other long term (current) drug therapy: Secondary | ICD-10-CM | POA: Diagnosis not present

## 2017-07-16 DIAGNOSIS — R000 Tachycardia, unspecified: Secondary | ICD-10-CM | POA: Diagnosis not present

## 2017-07-16 DIAGNOSIS — R05 Cough: Secondary | ICD-10-CM | POA: Diagnosis not present

## 2017-07-16 DIAGNOSIS — I639 Cerebral infarction, unspecified: Secondary | ICD-10-CM | POA: Diagnosis not present

## 2017-07-16 DIAGNOSIS — E039 Hypothyroidism, unspecified: Secondary | ICD-10-CM | POA: Diagnosis present

## 2017-07-16 DIAGNOSIS — E118 Type 2 diabetes mellitus with unspecified complications: Secondary | ICD-10-CM

## 2017-07-16 DIAGNOSIS — I421 Obstructive hypertrophic cardiomyopathy: Secondary | ICD-10-CM | POA: Diagnosis present

## 2017-07-16 DIAGNOSIS — E119 Type 2 diabetes mellitus without complications: Secondary | ICD-10-CM

## 2017-07-16 DIAGNOSIS — I10 Essential (primary) hypertension: Secondary | ICD-10-CM | POA: Diagnosis not present

## 2017-07-16 DIAGNOSIS — Z86718 Personal history of other venous thrombosis and embolism: Secondary | ICD-10-CM

## 2017-07-16 DIAGNOSIS — E860 Dehydration: Secondary | ICD-10-CM | POA: Diagnosis not present

## 2017-07-16 DIAGNOSIS — R140 Abdominal distension (gaseous): Secondary | ICD-10-CM

## 2017-07-16 DIAGNOSIS — M109 Gout, unspecified: Secondary | ICD-10-CM | POA: Diagnosis present

## 2017-07-16 DIAGNOSIS — M329 Systemic lupus erythematosus, unspecified: Secondary | ICD-10-CM | POA: Diagnosis not present

## 2017-07-16 DIAGNOSIS — J189 Pneumonia, unspecified organism: Secondary | ICD-10-CM | POA: Diagnosis not present

## 2017-07-16 DIAGNOSIS — Z86711 Personal history of pulmonary embolism: Secondary | ICD-10-CM | POA: Diagnosis not present

## 2017-07-16 DIAGNOSIS — J1008 Influenza due to other identified influenza virus with other specified pneumonia: Principal | ICD-10-CM | POA: Diagnosis present

## 2017-07-16 DIAGNOSIS — Z940 Kidney transplant status: Secondary | ICD-10-CM | POA: Diagnosis not present

## 2017-07-16 DIAGNOSIS — R197 Diarrhea, unspecified: Secondary | ICD-10-CM | POA: Diagnosis not present

## 2017-07-16 DIAGNOSIS — K219 Gastro-esophageal reflux disease without esophagitis: Secondary | ICD-10-CM | POA: Diagnosis present

## 2017-07-16 DIAGNOSIS — E785 Hyperlipidemia, unspecified: Secondary | ICD-10-CM | POA: Diagnosis present

## 2017-07-16 DIAGNOSIS — Z9049 Acquired absence of other specified parts of digestive tract: Secondary | ICD-10-CM | POA: Diagnosis not present

## 2017-07-16 DIAGNOSIS — R109 Unspecified abdominal pain: Secondary | ICD-10-CM | POA: Diagnosis not present

## 2017-07-16 DIAGNOSIS — Z7901 Long term (current) use of anticoagulants: Secondary | ICD-10-CM | POA: Diagnosis not present

## 2017-07-16 DIAGNOSIS — Z7984 Long term (current) use of oral hypoglycemic drugs: Secondary | ICD-10-CM | POA: Diagnosis not present

## 2017-07-16 DIAGNOSIS — E1122 Type 2 diabetes mellitus with diabetic chronic kidney disease: Secondary | ICD-10-CM | POA: Diagnosis present

## 2017-07-16 DIAGNOSIS — F329 Major depressive disorder, single episode, unspecified: Secondary | ICD-10-CM | POA: Diagnosis present

## 2017-07-16 DIAGNOSIS — I509 Heart failure, unspecified: Secondary | ICD-10-CM | POA: Diagnosis not present

## 2017-07-16 DIAGNOSIS — M810 Age-related osteoporosis without current pathological fracture: Secondary | ICD-10-CM | POA: Diagnosis present

## 2017-07-16 DIAGNOSIS — I132 Hypertensive heart and chronic kidney disease with heart failure and with stage 5 chronic kidney disease, or end stage renal disease: Secondary | ICD-10-CM | POA: Diagnosis present

## 2017-07-16 DIAGNOSIS — I69320 Aphasia following cerebral infarction: Secondary | ICD-10-CM

## 2017-07-16 DIAGNOSIS — E876 Hypokalemia: Secondary | ICD-10-CM | POA: Diagnosis not present

## 2017-07-16 DIAGNOSIS — J9601 Acute respiratory failure with hypoxia: Secondary | ICD-10-CM | POA: Diagnosis present

## 2017-07-16 DIAGNOSIS — J111 Influenza due to unidentified influenza virus with other respiratory manifestations: Secondary | ICD-10-CM | POA: Diagnosis not present

## 2017-07-16 LAB — CBC WITH DIFFERENTIAL/PLATELET
Basophils Absolute: 0 K/uL (ref 0.0–0.1)
Basophils Relative: 0 %
Eosinophils Absolute: 0 K/uL (ref 0.0–0.7)
Eosinophils Relative: 0 %
HCT: 42.7 % (ref 36.0–46.0)
Hemoglobin: 14.2 g/dL (ref 12.0–15.0)
Lymphocytes Relative: 29 %
Lymphs Abs: 2.3 K/uL (ref 0.7–4.0)
MCH: 29.9 pg (ref 26.0–34.0)
MCHC: 33.3 g/dL (ref 30.0–36.0)
MCV: 89.9 fL (ref 78.0–100.0)
Monocytes Absolute: 1.1 K/uL — ABNORMAL HIGH (ref 0.1–1.0)
Monocytes Relative: 14 %
Neutro Abs: 4.6 K/uL (ref 1.7–7.7)
Neutrophils Relative %: 57 %
Platelets: 145 K/uL — ABNORMAL LOW (ref 150–400)
RBC: 4.75 MIL/uL (ref 3.87–5.11)
RDW: 14.4 % (ref 11.5–15.5)
WBC: 8 K/uL (ref 4.0–10.5)

## 2017-07-16 LAB — COMPREHENSIVE METABOLIC PANEL
ALT: 15 U/L (ref 14–54)
AST: 25 U/L (ref 15–41)
Albumin: 3.3 g/dL — ABNORMAL LOW (ref 3.5–5.0)
Alkaline Phosphatase: 70 U/L (ref 38–126)
Anion gap: 11 (ref 5–15)
BUN: 14 mg/dL (ref 6–20)
CO2: 20 mmol/L — ABNORMAL LOW (ref 22–32)
Calcium: 8.4 mg/dL — ABNORMAL LOW (ref 8.9–10.3)
Chloride: 103 mmol/L (ref 101–111)
Creatinine, Ser: 0.73 mg/dL (ref 0.44–1.00)
GFR calc Af Amer: >60 mL/min (ref >60)
GFR calc non Af Amer: >60 mL/min (ref >60)
Glucose, Bld: 131 mg/dL — ABNORMAL HIGH (ref 65–99)
Potassium: 3.4 mmol/L — ABNORMAL LOW (ref 3.5–5.1)
Sodium: 134 mmol/L — ABNORMAL LOW (ref 135–145)
Total Bilirubin: 1.4 mg/dL — ABNORMAL HIGH (ref 0.3–1.2)
Total Protein: 6.9 g/dL (ref 6.5–8.1)

## 2017-07-16 LAB — URINALYSIS, ROUTINE W REFLEX MICROSCOPIC
Glucose, UA: NEGATIVE mg/dL
Hgb urine dipstick: NEGATIVE
Ketones, ur: 80 mg/dL — AB
Leukocytes, UA: NEGATIVE
Nitrite: NEGATIVE
Protein, ur: 30 mg/dL — AB
Specific Gravity, Urine: 1.031 — ABNORMAL HIGH (ref 1.005–1.030)
pH: 5 (ref 5.0–8.0)

## 2017-07-16 LAB — I-STAT TROPONIN, ED: Troponin i, poc: 0.01 ng/mL (ref 0.00–0.08)

## 2017-07-16 LAB — GLUCOSE, CAPILLARY: Glucose-Capillary: 170 mg/dL — ABNORMAL HIGH (ref 65–99)

## 2017-07-16 LAB — I-STAT CG4 LACTIC ACID, ED
Lactic Acid, Venous: 1.56 mmol/L (ref 0.5–1.9)
Lactic Acid, Venous: 0.82 mmol/L (ref 0.5–1.9)

## 2017-07-16 LAB — INFLUENZA PANEL BY PCR (TYPE A & B)
Influenza A By PCR: POSITIVE — AB
Influenza B By PCR: NEGATIVE

## 2017-07-16 LAB — PROTIME-INR
INR: 1.19
Prothrombin Time: 15 seconds (ref 11.4–15.2)

## 2017-07-16 MED ORDER — MYCOPHENOLATE MOFETIL 250 MG PO CAPS
500.0000 mg | ORAL_CAPSULE | Freq: Every day | ORAL | Status: DC
  Administered 2017-07-17 – 2017-07-20 (×4): 500 mg via ORAL
  Filled 2017-07-16 (×4): qty 2

## 2017-07-16 MED ORDER — IPRATROPIUM-ALBUTEROL 0.5-2.5 (3) MG/3ML IN SOLN
3.0000 mL | Freq: Three times a day (TID) | RESPIRATORY_TRACT | Status: DC
  Administered 2017-07-16 – 2017-07-17 (×2): 3 mL via RESPIRATORY_TRACT
  Filled 2017-07-16 (×4): qty 3

## 2017-07-16 MED ORDER — OXYBUTYNIN CHLORIDE 5 MG PO TABS: 5.0000 mg | ORAL_TABLET | Freq: Every day | ORAL | Status: DC | PRN

## 2017-07-16 MED ORDER — DEXTROSE 5 % IV SOLN
500.0000 mg | Freq: Once | INTRAVENOUS | Status: AC
  Administered 2017-07-16: 500 mg via INTRAVENOUS
  Filled 2017-07-16: qty 500

## 2017-07-16 MED ORDER — WARFARIN SODIUM 2.5 MG PO TABS: 2.5000 mg | ORAL_TABLET | Freq: Every day | ORAL | Status: DC

## 2017-07-16 MED ORDER — ACETAMINOPHEN 325 MG PO TABS
650.0000 mg | ORAL_TABLET | Freq: Once | ORAL | Status: AC
  Administered 2017-07-16: 650 mg via ORAL
  Filled 2017-07-16: qty 2

## 2017-07-16 MED ORDER — ACETAMINOPHEN 650 MG RE SUPP: 650.0000 mg | Freq: Four times a day (QID) | RECTAL | Status: DC | PRN

## 2017-07-16 MED ORDER — CALCITRIOL 0.25 MCG PO CAPS
0.2500 ug | ORAL_CAPSULE | Freq: Every day | ORAL | Status: DC
  Administered 2017-07-16 – 2017-07-20 (×5): 0.25 ug via ORAL
  Filled 2017-07-16 (×5): qty 1

## 2017-07-16 MED ORDER — HYDROCODONE-ACETAMINOPHEN 5-325 MG PO TABS: 1.0000 | ORAL_TABLET | ORAL | Status: DC | PRN

## 2017-07-16 MED ORDER — TACROLIMUS 1 MG PO CAPS
1.0000 mg | ORAL_CAPSULE | Freq: Two times a day (BID) | ORAL | Status: DC
  Administered 2017-07-16 – 2017-07-20 (×8): 1 mg via ORAL
  Filled 2017-07-16 (×8): qty 1

## 2017-07-16 MED ORDER — BISACODYL 5 MG PO TBEC: 5.0000 mg | DELAYED_RELEASE_TABLET | Freq: Every day | ORAL | Status: DC | PRN

## 2017-07-16 MED ORDER — MIRTAZAPINE 15 MG PO TABS
30.0000 mg | ORAL_TABLET | Freq: Every day | ORAL | Status: DC
Start: 2017-07-16 — End: 2017-07-20
  Administered 2017-07-16: 30 mg via ORAL
  Filled 2017-07-16 (×4): qty 2

## 2017-07-16 MED ORDER — SODIUM CHLORIDE 0.9 % IV BOLUS (SEPSIS)
1000.0000 mL | Freq: Once | INTRAVENOUS | Status: AC
  Administered 2017-07-16: 1000 mL via INTRAVENOUS

## 2017-07-16 MED ORDER — IPRATROPIUM-ALBUTEROL 0.5-2.5 (3) MG/3ML IN SOLN
3.0000 mL | RESPIRATORY_TRACT | Status: DC
  Administered 2017-07-16: 3 mL via RESPIRATORY_TRACT
  Filled 2017-07-16: qty 3

## 2017-07-16 MED ORDER — INSULIN ASPART 100 UNIT/ML ~~LOC~~ SOLN
0.0000 [IU] | Freq: Every day | SUBCUTANEOUS | Status: DC
  Administered 2017-07-17 – 2017-07-18 (×2): 2 [IU] via SUBCUTANEOUS

## 2017-07-16 MED ORDER — DILTIAZEM HCL ER COATED BEADS 120 MG PO CP24
240.0000 mg | ORAL_CAPSULE | Freq: Every day | ORAL | Status: DC
  Administered 2017-07-16 – 2017-07-20 (×5): 240 mg via ORAL
  Filled 2017-07-16: qty 1
  Filled 2017-07-16 (×4): qty 2
  Filled 2017-07-16: qty 1

## 2017-07-16 MED ORDER — DONEPEZIL HCL 10 MG PO TABS
10.0000 mg | ORAL_TABLET | Freq: Every day | ORAL | Status: DC
  Administered 2017-07-16 – 2017-07-19 (×3): 10 mg via ORAL
  Filled 2017-07-16 (×5): qty 1

## 2017-07-16 MED ORDER — ATORVASTATIN CALCIUM 40 MG PO TABS
40.0000 mg | ORAL_TABLET | Freq: Every day | ORAL | Status: DC
  Administered 2017-07-16 – 2017-07-20 (×5): 40 mg via ORAL
  Filled 2017-07-16 (×5): qty 1

## 2017-07-16 MED ORDER — WARFARIN SODIUM 5 MG PO TABS
5.0000 mg | ORAL_TABLET | Freq: Once | ORAL | Status: AC
  Administered 2017-07-16: 5 mg via ORAL
  Filled 2017-07-16 (×2): qty 1

## 2017-07-16 MED ORDER — INSULIN ASPART 100 UNIT/ML ~~LOC~~ SOLN
0.0000 [IU] | Freq: Three times a day (TID) | SUBCUTANEOUS | Status: DC
  Administered 2017-07-17: 3 [IU] via SUBCUTANEOUS
  Administered 2017-07-17: 5 [IU] via SUBCUTANEOUS
  Administered 2017-07-17 – 2017-07-19 (×5): 3 [IU] via SUBCUTANEOUS
  Administered 2017-07-19 – 2017-07-20 (×4): 2 [IU] via SUBCUTANEOUS

## 2017-07-16 MED ORDER — DEXTROSE 5 % IV SOLN
1.0000 g | Freq: Once | INTRAVENOUS | Status: AC
  Administered 2017-07-16: 1 g via INTRAVENOUS
  Filled 2017-07-16: qty 10

## 2017-07-16 MED ORDER — POTASSIUM CHLORIDE CRYS ER 20 MEQ PO TBCR
40.0000 meq | EXTENDED_RELEASE_TABLET | Freq: Once | ORAL | Status: DC
  Filled 2017-07-16: qty 2

## 2017-07-16 MED ORDER — LEVOTHYROXINE SODIUM 25 MCG PO TABS
125.0000 ug | ORAL_TABLET | Freq: Every day | ORAL | Status: DC
  Administered 2017-07-17 – 2017-07-20 (×4): 125 ug via ORAL
  Filled 2017-07-16 (×5): qty 1

## 2017-07-16 MED ORDER — LOSARTAN POTASSIUM 50 MG PO TABS
100.0000 mg | ORAL_TABLET | Freq: Every day | ORAL | Status: DC
  Administered 2017-07-16 – 2017-07-20 (×5): 100 mg via ORAL
  Filled 2017-07-16 (×5): qty 2

## 2017-07-16 MED ORDER — MYCOPHENOLATE MOFETIL 250 MG PO CAPS
250.0000 mg | ORAL_CAPSULE | Freq: Every day | ORAL | Status: DC
  Administered 2017-07-16 – 2017-07-19 (×4): 250 mg via ORAL
  Filled 2017-07-16 (×4): qty 1

## 2017-07-16 MED ORDER — FLEET ENEMA 7-19 GM/118ML RE ENEM: 1.0000 | ENEMA | Freq: Once | RECTAL | Status: DC | PRN

## 2017-07-16 MED ORDER — SODIUM CHLORIDE 0.9 % IV SOLN
INTRAVENOUS | Status: DC
  Administered 2017-07-16 – 2017-07-20 (×5): via INTRAVENOUS

## 2017-07-16 MED ORDER — ACETAMINOPHEN 325 MG PO TABS
650.0000 mg | ORAL_TABLET | Freq: Four times a day (QID) | ORAL | Status: DC | PRN
  Administered 2017-07-19: 650 mg via ORAL
  Filled 2017-07-16: qty 2

## 2017-07-16 MED ORDER — VANCOMYCIN HCL IN DEXTROSE 750-5 MG/150ML-% IV SOLN
750.0000 mg | INTRAVENOUS | Status: AC
  Administered 2017-07-16: 750 mg via INTRAVENOUS
  Filled 2017-07-16: qty 150

## 2017-07-16 MED ORDER — ALENDRONATE SODIUM 70 MG PO TABS: 70.0000 mg | ORAL_TABLET | ORAL | Status: DC

## 2017-07-16 MED ORDER — QUETIAPINE FUMARATE 25 MG PO TABS
25.0000 mg | ORAL_TABLET | Freq: Every day | ORAL | Status: DC
  Administered 2017-07-16 – 2017-07-19 (×4): 25 mg via ORAL
  Filled 2017-07-16 (×4): qty 1

## 2017-07-16 MED ORDER — PIPERACILLIN-TAZOBACTAM 3.375 G IVPB
3.3750 g | Freq: Three times a day (TID) | INTRAVENOUS | Status: DC
Start: 2017-07-16 — End: 2017-07-20
  Administered 2017-07-16 – 2017-07-20 (×10): 3.375 g via INTRAVENOUS
  Filled 2017-07-16 (×10): qty 50

## 2017-07-16 MED ORDER — ALBUTEROL SULFATE (2.5 MG/3ML) 0.083% IN NEBU: 2.5000 mg | INHALATION_SOLUTION | RESPIRATORY_TRACT | Status: DC | PRN

## 2017-07-16 MED ORDER — WARFARIN - PHARMACIST DOSING INPATIENT
Freq: Every day | Status: DC
  Administered 2017-07-17: 17:00:00

## 2017-07-16 MED ORDER — METHYLPREDNISOLONE SODIUM SUCC 40 MG IJ SOLR
40.0000 mg | Freq: Three times a day (TID) | INTRAMUSCULAR | Status: DC
  Administered 2017-07-16 – 2017-07-18 (×6): 40 mg via INTRAVENOUS
  Filled 2017-07-16 (×6): qty 1

## 2017-07-16 MED ORDER — PANTOPRAZOLE SODIUM 40 MG PO TBEC
40.0000 mg | DELAYED_RELEASE_TABLET | Freq: Every day | ORAL | Status: DC
  Administered 2017-07-17 – 2017-07-19 (×3): 40 mg via ORAL
  Filled 2017-07-16 (×3): qty 1

## 2017-07-16 MED ORDER — SERTRALINE HCL 50 MG PO TABS
50.0000 mg | ORAL_TABLET | Freq: Every day | ORAL | Status: DC
  Administered 2017-07-16 – 2017-07-20 (×5): 50 mg via ORAL
  Filled 2017-07-16 (×5): qty 1

## 2017-07-16 MED ORDER — OSELTAMIVIR PHOSPHATE 75 MG PO CAPS
75.0000 mg | ORAL_CAPSULE | Freq: Two times a day (BID) | ORAL | Status: DC
  Administered 2017-07-17 – 2017-07-20 (×7): 75 mg via ORAL
  Filled 2017-07-16 (×7): qty 1

## 2017-07-16 MED ORDER — SENNOSIDES-DOCUSATE SODIUM 8.6-50 MG PO TABS: 1.0000 | ORAL_TABLET | Freq: Every evening | ORAL | Status: DC | PRN

## 2017-07-16 MED ORDER — VANCOMYCIN HCL IN DEXTROSE 750-5 MG/150ML-% IV SOLN
750.0000 mg | Freq: Two times a day (BID) | INTRAVENOUS | Status: DC
  Administered 2017-07-17: 750 mg via INTRAVENOUS
  Filled 2017-07-16 (×2): qty 150

## 2017-07-16 NOTE — ED Notes (Addendum)
1x attempted to call report, no answer

## 2017-07-16 NOTE — ED Triage Notes (Signed)
Transported by [facility] from home--generalized abdominal pain since Wednesday. +n/d. Hx of recent PE. Patient also reports decrease in appetite.

## 2017-07-16 NOTE — ED Notes (Signed)
Bed: WA08 Expected date:  Expected time:  Means of arrival:  Comments: 

## 2017-07-16 NOTE — Progress Notes (Signed)
PHARMACIST - PHYSICIAN COMMUNICATION  CONCERNING: P&T Medication Policy Regarding Oral Bisphosphonates  RECOMMENDATION: Your order for alendronate (Fosamax) has been discontinued at this time.  If the patient's post-hospital medical condition warrants safe use of this class of drugs, please resume the pre-hospital regimen upon discharge.  DESCRIPTION:  Alendronate (Fosamax) can cause severe esophageal erosions in patients who are unable to remain upright at least 30 minutes after taking this medication.   Since brief interruptions in therapy are thought to have minimal impact on bone mineral density, the Pharmacy & Therapeutics Committee has established that bisphosphonate orders should be routinely discontinued during hospitalization.   To override this safety policy and permit administration of Fosamax in the hospital, prescribers must write "DO NOT HOLD" in the comments section when placing the order for this class of medications.   Adelaide Pfefferkorn, PharmD 

## 2017-07-16 NOTE — ED Notes (Signed)
Pt states she does not feel like she can use the restroom at this time.

## 2017-07-16 NOTE — H&P (Signed)
History and Physical    Connie Ruiz SMO:[id number] DOB: 11-24-1960 DOA: 07/16/2017  PCP: Renato Shin, MD Patient coming from: Home  Chief Complaint: Shortness of breath and cough  HPI: Connie Ruiz is a 57 y.o. female with medical history significant of CVA on Coumadin, HOCM, hypothyroidism, renal transplant in 2005, [name], GERD, essential hypertension came to the hospital with complains of cough and shortness of breath.  Patient states for the past 4 days she has been experiencing generalized malaise, some arthralgias, sore throat, productive cough bringing up greenish thick mucus and mild shortness of breath with exertion.  She lives alone at home and denies any sick contacts.  She is compliant with her medications and follows up with transplant nephrology at [facility].  In the ER today patient was noted to be febrile and tachycardic.  She was also hypoxic at 88% at room air.  Chest x-ray showed right lower lobe infiltrate/opacity concerning for pneumonia.  She was given Tylenol and started on Rocephin and azithromycin.  She was placed on 2 L supplemental oxygen.  It was decided to admit her given her immunocompromised state and overall condition and comorbidities.   Review of Systems: As per HPI otherwise 10 point review of systems negative.   Past Medical History:  Diagnosis Date  . Candida esophagitis ([place]) 11/12/2014  . CEREBROVASCULAR ACCIDENT, ACUTE 04/15/2010  . CLOSTRIDIUM DIFFICILE COLITIS, HX OF 08/21/2007  . CONGESTIVE HEART FAILURE 08/21/2007  . Current use of long term anticoagulation    Dr. Andree Elk, [facility]  . CVA 04/17/2010  . Depression    Dr. Andree Elk, [place], TYPE II 08/21/2007  . DVT, HX OF 08/21/2007  . GERD 08/21/2007  . GOUT 08/21/2007  . History of stroke with residual effects   . HYPERLIPIDEMIA 08/21/2007  . HYPERTENSION 08/21/2007   Dr. Andree Elk, [place], HX OF 08/22/2007   s/p renal transplant-Dr. Andree Elk,  [place] 08/21/2007  . OSTEOPOROSIS 08/21/2007   Rheumatol at [facility]  . Pulmonary embolism ([place]) 07/16/2010  . Renal failure   . RENAL INSUFFICIENCY 08/21/2007  . Right sided weakness   . Steroid-induced hyperglycemia 11/09/2014  . Tachycardia   . THYROID NODULE, LEFT 04/10/2009    Past Surgical History:  Procedure Laterality Date  . CHOLECYSTECTOMY    . ENTEROSCOPY N/A 11/11/2014   Procedure: ENTEROSCOPY;  Surgeon: Ladene Artist, MD;  Location: [facility];  Service: Endoscopy;  Laterality: N/A;  . KIDNEY TRANSPLANT Right 2009  . RENAL BIOPSY, OPEN  1981  . TUBAL LIGATION       reports that  has never smoked. she has never used smokeless tobacco. She reports that she does not drink alcohol or use drugs.  Allergies  Allergen Reactions  . Oxycodone-Acetaminophen Shortness Of Breath and Nausea Only  . Propoxyphene N-Acetaminophen Shortness Of Breath and Nausea Only  . Sulfonamide Derivatives Shortness Of Breath and Nausea Only  . Adhesive [Tape]   . Codeine Nausea Only  . Other Other (See Comments)    No blood, Jehovaeh Witness   . Gabapentin Anxiety    twitching  . Latex Rash  . Metoprolol Rash  . Morphine And Related Rash    IV site on arm is red, patient reports this is improving.  NO shortness of breath reported.  . Rosiglitazone Rash    Family History  Problem Relation Age of Onset  . Heart attack Mother   . Heart disease Father   .  Asthma Sister   . Asthma Sister   . Asthma Daughter   . Cancer Maternal Grandfather        prostate  . Cancer Paternal Grandfather        colon     Prior to Admission medications   Medication Sig Start Date End Date Taking? Authorizing Provider  alendronate (FOSAMAX) 70 MG tablet Take 1 tablet (70 mg total) by mouth once a week. Take on Saturdays with a full glass of water on an empty stomach. 09/27/16  Yes Renato Shin, MD  atorvastatin (LIPITOR) 40 MG tablet Take 1 tablet (40 mg total) by mouth daily. 01/26/17  Yes  Renato Shin, MD  Blood Glucose Monitoring Suppl (ONETOUCH VERIO IQ SYSTEM) W/DEVICE KIT Use to check blood sugar 1 time per day 02/12/15  Yes Renato Shin, MD  calcitRIOL (ROCALTROL) 0.25 MCG capsule TAKE 1 CAPSULE(0.25 MCG) BY MOUTH DAILY 07/07/17  Yes Renato Shin, MD  diltiazem [facility]) 240 MG 24 hr capsule Take 1 capsule (240 mg total) by mouth daily. 10/29/15  Yes Renato Shin, MD  donepezil (ARICEPT) 10 MG tablet TAKE 1 TABLET(10 MG) BY MOUTH AT BEDTIME 11/18/16  Yes Jaffe, Adam R, DO  esomeprazole (NEXIUM) 20 MG capsule Take 20 mg by mouth daily. 03/21/15  Yes [provider]  glucose blood (ONE TOUCH ULTRA TEST) test strip Use to check blood sugar 1 time per day 02/12/15  Yes Renato Shin, MD  levothyroxine (SYNTHROID, LEVOTHROID) 125 MCG tablet Take 1 tablet (125 mcg total) by mouth daily. 06/24/17  Yes Renato Shin, MD  losartan (COZAAR) 100 MG tablet Take 1 tablet (100 mg total) by mouth daily. 06/03/17  Yes Renato Shin, MD  metFORMIN (GLUCOPHAGE-XR) 500 MG 24 hr tablet Take 1 tablet (500 mg total) by mouth daily with breakfast. 06/27/17  Yes Renato Shin, MD  mirtazapine (REMERON) 15 MG tablet TAKE 1 TABLET(15 MG) BY MOUTH AT BEDTIME 06/20/17  Yes Jaffe, Adam R, DO  mirtazapine (REMERON) 30 MG tablet Take 1 tablet (30 mg total) by mouth at bedtime. 07/28/15  Yes Jaffe, Adam R, DO  mycophenolate (CELLCEPT) 250 MG capsule Take 250-500 mg by mouth 2 (two) times daily. 500 mg QAM and 250 mg in the evening   Yes [provider]  nystatin (MYCOSTATIN) powder Apply 1 g topically 4 (four) times daily as needed. For yeast under breast   Yes [provider]  ondansetron (ZOFRAN ODT) 4 MG disintegrating tablet 60m ODT q4 hours prn nausea/vomiting 06/15/15  Yes Mesner, JCorene Cornea MD  [facility] Center[facility] LANCETS 324MMISC Use to check blood sugar 1 time per day 02/12/15  Yes ERenato Shin MD  oxybutynin (DITROPAN) 5 MG tablet Take 5 mg by mouth daily as needed for bladder spasms.    Yes  [provider]  predniSONE (DELTASONE) 5 MG tablet Take 5 mg by mouth daily. 06/30/17  Yes [provider]  QUEtiapine (SEROQUEL) 25 MG tablet Take 1 tablet (25 mg total) by mouth at bedtime. 07/28/15  Yes Jaffe, Adam R, DO  sertraline (ZOLOFT) 100 MG tablet Take 0.5 tablets (50 mg total) by mouth daily. 01/25/17  Yes ERenato Shin MD  tacrolimus (PROGRAF) 1 MG capsule Take 1 mg by mouth 2 (two) times daily.  04/16/14  Yes [provider]  warfarin (COUMADIN) 5 MG tablet TAKE 1 TABLET BY MOUTH DAILY OR AS DIRECTED BY COUMADIN CLINIC 04/12/17  Yes BLorretta Harp MD  enoxaparin (LOVENOX) 80 MG/0.8ML injection Inject 0.8 mLs (80 mg total)  into the skin every 12 (twelve) hours. As instructed by Coumadin Clinic Patient not taking: Reported on 07/16/2017 11/25/16   Lorretta Harp, MD  levothyroxine (SYNTHROID) 125 MCG tablet Take 1 tablet (125 mcg total) by mouth daily before breakfast. Patient not taking: Reported on 07/16/2017 06/15/17   Renato Shin, MD  traMADol (ULTRAM) 50 MG tablet Take 1 tablet (50 mg total) by mouth every 8 (eight) hours as needed. Patient not taking: Reported on 07/16/2017 07/07/15   Renato Shin, MD    Physical Exam: Vitals:   07/16/17 1247 07/16/17 1300 07/16/17 1513 07/16/17 1530  BP: (!) 123/91 121/89 (!) 140/95 (!) 138/94  Pulse: (!) 124 (!) 123 (!) 129 (!) 128  Resp: (!) 27 18 (!) 22 (!) 29  Temp:      TempSrc:      SpO2: 97% 100% 93% (!) 87%      Constitutional: Generally ill appearing but calm and comfortable at this time.  Slightly warm to touch. Vitals:   07/16/17 1247 07/16/17 1300 07/16/17 1513 07/16/17 1530  BP: (!) 123/91 121/89 (!) 140/95 (!) 138/94  Pulse: (!) 124 (!) 123 (!) 129 (!) 128  Resp: (!) 27 18 (!) 22 (!) 29  Temp:      TempSrc:      SpO2: 97% 100% 93% (!) 87%   Eyes: PERRL, lids and conjunctivae normal ENMT: Mucous membranes are moist. Posterior pharynx clear of any exudate or lesions.Normal dentition.    Neck: normal, supple, no masses, no thyromegaly Respiratory: Diminished breath sounds at the right lower base of her lungs with diffuse expiratory wheezing. Cardiovascular: Regular rate and rhythm, no murmurs / rubs / gallops. No extremity edema. 2+ pedal pulses. No carotid bruits.  Abdomen: no tenderness, no masses palpated. No hepatosplenomegaly. Bowel sounds positive.  Musculoskeletal: no clubbing / cyanosis. No joint deformity upper and lower extremities. Good ROM, no contractures. Normal muscle tone.  Skin: no rashes, lesions, ulcers. No induration Neurologic: CN 2-12 grossly intact. Sensation intact, DTR normal. Strength 5/5 in all 4.  Psychiatric: Normal judgment and insight. Alert and oriented x 3. Normal mood.     Labs on Admission: I have personally reviewed following labs and imaging studies  CBC: Recent Labs  Lab 07/16/17 1240  WBC 8.0  NEUTROABS 4.6  HGB 14.2  HCT 42.7  MCV 89.9  PLT 355*   Basic Metabolic Panel: Recent Labs  Lab 07/16/17 1240  NA 134*  K 3.4*  CL 103  CO2 20*  GLUCOSE 131*  BUN 14  CREATININE 0.73  CALCIUM 8.4*   GFR: CrCl cannot be calculated (Unknown ideal weight.). Liver Function Tests: Recent Labs  Lab 07/16/17 1240  AST 25  ALT 15  ALKPHOS 70  BILITOT 1.4*  PROT 6.9  ALBUMIN 3.3*   No results for input(s): LIPASE, AMYLASE in the last 168 hours. No results for input(s): AMMONIA in the last 168 hours. Coagulation Profile: No results for input(s): INR, PROTIME in the last 168 hours. Cardiac Enzymes: No results for input(s): CKTOTAL, CKMB, CKMBINDEX, TROPONINI in the last 168 hours. BNP (last 3 results) No results for input(s): PROBNP in the last 8760 hours. HbA1C: No results for input(s): HGBA1C in the last 72 hours. CBG: No results for input(s): GLUCAP in the last 168 hours. Lipid Profile: No results for input(s): CHOL, HDL, LDLCALC, TRIG, CHOLHDL, LDLDIRECT in the last 72 hours. Thyroid Function Tests: No results  for input(s): TSH, T4TOTAL, FREET4, T3FREE, THYROIDAB in the last 72 hours. Anemia  Panel: No results for input(s): VITAMINB12, FOLATE, FERRITIN, TIBC, IRON, RETICCTPCT in the last 72 hours. Urine analysis:    Component Value Date/Time   COLORURINE YELLOW 01/04/2016 0832   APPEARANCEUR CLOUDY (A) 01/04/2016 0832   LABSPEC 1.024 01/04/2016 0832   PHURINE 5.5 01/04/2016 0832   GLUCOSEU NEGATIVE 01/04/2016 0832   GLUCOSEU NEGATIVE 10/22/2013 1621   HGBUR NEGATIVE 01/04/2016 0832   BILIRUBINUR SMALL (A) 01/04/2016 0832   KETONESUR 15 (A) 01/04/2016 0832   PROTEINUR NEGATIVE 01/04/2016 0832   UROBILINOGEN 1.0 03/27/2015 1534   NITRITE NEGATIVE 01/04/2016 0832   LEUKOCYTESUR NEGATIVE 01/04/2016 0832   Sepsis Labs: !!!!!!!!!!!!!!!!!!!!!!!!!!!!!!!!!!!!!!!!!!!! '@[email or web address]' (procalcitonin:4,lacticidven:4) )No results found for this or any previous visit (from the past 240 hour(s)).   Radiological Exams on Admission: Dg Chest 2 View  Result Date: 07/16/2017 CLINICAL DATA:  57 year old female with cough, congestion, fever and body aches for the past 4 days EXAM: CHEST  2 VIEW COMPARISON:  Prior chest x-ray 09/23/2016 FINDINGS: New patchy airspace opacity in the right lower lobe concerning for bronchopneumonia given the clinical history. There is associated elevation of the right hemidiaphragm. Stable mild cardiomegaly. Background bronchitic changes and interstitial prominence are similar compared to prior. No acute osseous abnormality. IMPRESSION: 1. Right lower lobe pneumonia. Followup PA and lateral chest X-ray is recommended in 3-4 weeks following trial of antibiotic therapy to ensure resolution and exclude underlying malignancy. 2. Elevation of the right hemidiaphragm likely secondary to right lower lobe volume loss. Electronically Signed   By: Jacqulynn Cadet M.D.   On: 07/16/2017 13:31    EKG: Independently reviewed.   Assessment/Plan Active Problems:   CAP (community acquired pneumonia)     Acute respiratory distress with hypoxia, 88% on room air Diffuse myalgia and arthralgia Right lower lobe community acquired pneumonia -Admit the patient to the stepdown unit for closer monitoring - Patient is to get IV fluids, UA has been ordered, blood culture sent -Chest x-ray shows right lower lobe infiltrate/opacity -Check for flu -Sputum cultures ordered -We will use broad-spectrum antibiotics at the moment vancomycin and Zosyn.  Narrow down when appropriate -Nebulizer treatments every 4 hours -Robitussin as needed, will give Solu-Medrol 40 mg every 8 hours as she is currently wheezing  Elevated total bilirubin - Abdomen is overall benign.  We will trend LFTs for now.  If this fails to resolve we will consider further evaluation starting with right upper quadrant ultrasound  History of systemic lupus History of ESRD on dialysis for 5 years status post renal transplant Renal transplant in 2005, at [facility] - Patient's renal function is around baseline 0.7, closely monitor urine output.  Will avoid nephrotoxic drugs -Continue CellCept and Prograf.  Hold off on prednisone as patient is currently on Solu-Medrol  Diabetes mellitus type 2  -Holding metformin, Accu-Cheks and sliding scale  History of CVA with residual expressive aphasia - Continue her current regimen of Coumadin.  Pharmacy to dose  Essential hypertension - She can continue her home regimen including Cozaar 100 mg orally daily, diltiazem 240 mg orally daily  Hypothyroidism -Continue Synthroid 125 mcg orally daily  GERD -Continue Nexium  Patient is [name]  DVT prophylaxis: Currently on Coumadin Code Status: Full code Family Communication: Daughter at bedside Disposition Plan: To be determined Consults called: None Admission status:  Inpatient admission.  Patient will be admitted as inpatient given her immunocompromised state in the setting of renal transplant and on being CellCept,  steroids, Prograf.  She is also hypoxic on room air.  Gazella Anglin Arsenio Loader MD [facility] Pager [phone]  If 7PM-7AM, please contact night-coverage [email or web address] Password [email or web address]  07/16/2017, 5:15 PM

## 2017-07-16 NOTE — ED Notes (Signed)
ED TO INPATIENT HANDOFF REPORT  Name/Age/Gender Connie Ruiz 57 y.o. female  Code Status    Code Status Orders  (From admission, onward)        Start     Ordered   07/16/17 1700  Full code  Continuous     07/16/17 1704    Code Status History    Date Active Date Inactive Code Status Order ID Comments User Context   02/25/2015 01:55 02/27/2015 21:58 Full Code [id number]  Allyne Gee, MD Inpatient   02/14/2015 10:32 02/22/2015 20:39 Full Code [id number]  Ivor Costa, MD ED   11/02/2014 06:08 11/14/2014 18:59 Full Code [id number]  Lavina Hamman, MD Inpatient   06/14/2013 17:31 06/20/2013 21:11 Full Code [id number]  Mendel Corning, MD Inpatient   06/17/2011 22:14 06/19/2011 18:56 Full Code [id number]  Suszanne Finch, RN ED      Home/SNF/Other Home  Chief Complaint abd pain  Level of Care/Admitting Diagnosis ED Disposition    ED Disposition Condition [facility]: [facility] [[id number]]  Level of Care: Stepdown [14]  Admit to SDU based on following criteria: Hemodynamic compromise or significant risk of instability:  Patient requiring short term acute titration and management of vasoactive drips, and invasive monitoring (i.e., CVP and Arterial line).  Diagnosis: CAP (community acquired pneumonia) [[id number]]  Admitting Physician: Gerlean Ren [facility] [[id number]]  Attending Physician: Gerlean Ren [facility] [[id number]]  Estimated length of stay: past midnight tomorrow  Certification:: I certify this patient will need inpatient services for at least 2 midnights  PT Class (Do Not Modify): Inpatient [101]  PT Acc Code (Do Not Modify): Private [1]       Medical History Past Medical History:  Diagnosis Date  . Candida esophagitis ([place]) 11/12/2014  . CEREBROVASCULAR ACCIDENT, ACUTE 04/15/2010  . CLOSTRIDIUM DIFFICILE COLITIS, HX OF 08/21/2007  . CONGESTIVE HEART FAILURE 08/21/2007  . Current use of long term anticoagulation    Dr. Andree Elk, [facility]  . CVA  04/17/2010  . Depression    Dr. Andree Elk, [place], TYPE II 08/21/2007  . DVT, HX OF 08/21/2007  . GERD 08/21/2007  . GOUT 08/21/2007  . History of stroke with residual effects   . HYPERLIPIDEMIA 08/21/2007  . HYPERTENSION 08/21/2007   Dr. Andree Elk, [place], HX OF 08/22/2007   s/p renal transplant-Dr. Andree Elk, [place] 08/21/2007  . OSTEOPOROSIS 08/21/2007   Rheumatol at [facility]  . Pulmonary embolism ([place]) 07/16/2010  . Renal failure   . RENAL INSUFFICIENCY 08/21/2007  . Right sided weakness   . Steroid-induced hyperglycemia 11/09/2014  . Tachycardia   . THYROID NODULE, LEFT 04/10/2009    Allergies Allergies  Allergen Reactions  . Oxycodone-Acetaminophen Shortness Of Breath and Nausea Only  . Propoxyphene N-Acetaminophen Shortness Of Breath and Nausea Only  . Sulfonamide Derivatives Shortness Of Breath and Nausea Only  . Adhesive [Tape]   . Codeine Nausea Only  . Other Other (See Comments)    No blood, Jehov[employer] Witness   . Gabapentin Anxiety    twitching  . Latex Rash  . Metoprolol Rash  . Morphine And Related Rash    IV site on arm is red, patient reports this is improving.  NO shortness of breath reported.  . Rosiglitazone Rash    IV Location/Drains/Wounds Patient Lines/Drains/Airways Status   Active Line/Drains/Airways    Name:   Placement date:   Placement time:   Site:   Days:  Peripheral IV 07/16/17 Left Wrist   07/16/17    1242    Wrist   less than 1   Fistula / Graft Right Forearm Arteriovenous fistula   -    -    Forearm             Labs/Imaging Results for orders placed or performed during the hospital encounter of 07/16/17 (from the past 48 hour(s))  Comprehensive metabolic panel     Status: Abnormal   Collection Time: 07/16/17 12:40 PM  Result Value Ref Range   Sodium 134 (L) 135 - 145 mmol/L   Potassium 3.4 (L) 3.5 - 5.1 mmol/L   Chloride 103 101 - 111 mmol/L   CO2 20 (L) 22 - 32 mmol/L   Glucose, Bld 131 (H)  65 - 99 mg/dL   BUN 14 6 - 20 mg/dL   Creatinine, Ser 0.73 0.44 - 1.00 mg/dL   Calcium 8.4 (L) 8.9 - 10.3 mg/dL   Total Protein 6.9 6.5 - 8.1 g/dL   Albumin 3.3 (L) 3.5 - 5.0 g/dL   AST 25 15 - 41 U/L   ALT 15 14 - 54 U/L   Alkaline Phosphatase 70 38 - 126 U/L   Total Bilirubin 1.4 (H) 0.3 - 1.2 mg/dL   GFR calc non Af Amer >60 >60 mL/min   GFR calc Af Amer >60 >60 mL/min    Comment: (NOTE) The eGFR has been calculated using the CKD EPI equation. This calculation has not been validated in all clinical situations. eGFR's persistently <60 mL/min signify possible Chronic Kidney Disease.    Anion gap 11 5 - 15    Comment: Performed at [facility], [place] [address], [place], [place] [address]  CBC with Differential     Status: Abnormal   Collection Time: 07/16/17 12:40 PM  Result Value Ref Range   WBC 8.0 4.0 - 10.5 K/uL   RBC 4.75 3.87 - 5.11 MIL/uL   Hemoglobin 14.2 12.0 - 15.0 g/dL   HCT 42.7 36.0 - 46.0 %   MCV 89.9 78.0 - 100.0 fL   MCH 29.9 26.0 - 34.0 pg   MCHC 33.3 30.0 - 36.0 g/dL   RDW 14.4 11.5 - 15.5 %   Platelets 145 (L) 150 - 400 K/uL   Neutrophils Relative % 57 %   Neutro Abs 4.6 1.7 - 7.7 K/uL   Lymphocytes Relative 29 %   Lymphs Abs 2.3 0.7 - 4.0 K/uL   Monocytes Relative 14 %   Monocytes Absolute 1.1 (H) 0.1 - 1.0 K/uL   Eosinophils Relative 0 %   Eosinophils Absolute 0.0 0.0 - 0.7 K/uL   Basophils Relative 0 %   Basophils Absolute 0.0 0.0 - 0.1 K/uL    Comment: Performed at [facility], [place] [address] [place], [place] [address]  I-stat troponin, ED     Status: None   Collection Time: 07/16/17 12:45 PM  Result Value Ref Range   Troponin i, poc 0.01 0.00 - 0.08 ng/mL   Comment 3            Comment: Due to the release kinetics of cTnI, a negative result within the first hours of the onset of symptoms does not rule out myocardial infarction with certainty. If myocardial infarction is still suspected, repeat the test  at appropriate intervals.   I-Stat CG4 Lactic Acid, ED     Status: None   Collection Time: 07/16/17 12:47 PM  Result Value Ref Range  Lactic Acid, Venous 1.56 0.5 - 1.9 mmol/L  Influenza panel by PCR (type A & B)     Status: Abnormal   Collection Time: 07/16/17  3:40 PM  Result Value Ref Range   Influenza A By PCR POSITIVE (A) NEGATIVE   Influenza B By PCR NEGATIVE NEGATIVE    Comment: (NOTE) The Xpert Xpress Flu assay is intended as an aid in the diagnosis of  influenza and should not be used as a sole basis for treatment.  This  assay is FDA approved for nasopharyngeal swab specimens only. Nasal  washings and aspirates are unacceptable for Xpert Xpress Flu testing. Performed at [facility], [place] [address] [place], [place] [address]   I-Stat CG4 Lactic Acid, ED     Status: None   Collection Time: 07/16/17  5:50 PM  Result Value Ref Range   Lactic Acid, Venous 0.82 0.5 - 1.9 mmol/L   Dg Chest 2 View  Result Date: 07/16/2017 CLINICAL DATA:  57 year old female with cough, congestion, fever and body aches for the past 4 days EXAM: CHEST  2 VIEW COMPARISON:  Prior chest x-ray 09/23/2016 FINDINGS: New patchy airspace opacity in the right lower lobe concerning for bronchopneumonia given the clinical history. There is associated elevation of the right hemidiaphragm. Stable mild cardiomegaly. Background bronchitic changes and interstitial prominence are similar compared to prior. No acute osseous abnormality. IMPRESSION: 1. Right lower lobe pneumonia. Followup PA and lateral chest X-ray is recommended in 3-4 weeks following trial of antibiotic therapy to ensure resolution and exclude underlying malignancy. 2. Elevation of the right hemidiaphragm likely secondary to right lower lobe volume loss. Electronically Signed   By: Jacqulynn Cadet M.D.   On: 07/16/2017 13:31    Pending Labs Unresulted Labs (From admission, onward)   Start     Ordered   07/17/17 0500   Comprehensive metabolic panel  Daily,   R     07/16/17 1704   07/17/17 0500  CBC  Daily,   R     07/16/17 1704   07/17/17 0500  Creatinine, serum  Daily,   R     07/16/17 1834   07/17/17 0500  Protime-INR  Daily,   R     07/16/17 1834   07/16/17 1810  MRSA PCR Screening  Once,   R     07/16/17 1810   07/16/17 1701  Culture, sputum-assessment  Once,   R     07/16/17 1704   07/16/17 1659  HIV antibody (Routine Testing)  Once,   R     07/16/17 1704   07/16/17 1610  Protime-INR  STAT,   STAT     07/16/17 1609   07/16/17 1513  Blood culture (routine x 2)  BLOOD CULTURE X 2,   STAT     07/16/17 1512   07/16/17 1243  Urinalysis, Routine w reflex microscopic  STAT,   STAT     07/16/17 1242      Vitals/Pain Today's Vitals   07/16/17 1513 07/16/17 1530 07/16/17 1739 07/16/17 1756  BP: (!) 140/95 (!) 138/94 (!) 131/93   Pulse: (!) 129 (!) 128 (!) 119   Resp: (!) 22 (!) 29 20   Temp:      TempSrc:      SpO2: 93% (!) 87% 92% 92%    Isolation Precautions Droplet precaution  Medications Medications  azithromycin (ZITHROMAX) 500 mg in dextrose 5 % 250 mL IVPB (500 mg Intravenous New Bag/Given 07/16/17 1744)  mycophenolate (CELLCEPT)  capsule 500 mg (not administered)  tacrolimus (PROGRAF) capsule 1 mg (not administered)  pantoprazole (PROTONIX) EC tablet 40 mg (not administered)  mirtazapine (REMERON) tablet 30 mg (not administered)  QUEtiapine (SEROQUEL) tablet 25 mg (not administered)  diltiazem (TIAZAC) 24 hr capsule 240 mg (not administered)  donepezil (ARICEPT) tablet 10 mg (not administered)  sertraline (ZOLOFT) tablet 50 mg (not administered)  atorvastatin (LIPITOR) tablet 40 mg (not administered)  losartan (COZAAR) tablet 100 mg (not administered)  levothyroxine (SYNTHROID, LEVOTHROID) tablet 125 mcg (not administered)  calcitRIOL (ROCALTROL) capsule 0.25 mcg (not administered)  oxybutynin (DITROPAN) tablet 5 mg (not administered)  0.9 %  sodium chloride infusion (not  administered)  acetaminophen (TYLENOL) tablet 650 mg (not administered)    Or  acetaminophen (TYLENOL) suppository 650 mg (not administered)  HYDROcodone-acetaminophen (NORCO/VICODIN) 5-325 MG per tablet 1-2 tablet (not administered)  senna-docusate (Senokot-S) tablet 1 tablet (not administered)  bisacodyl (DULCOLAX) EC tablet 5 mg (not administered)  sodium phosphate (FLEET) 7-19 GM/118ML enema 1 enema (not administered)  insulin aspart (novoLOG) injection 0-9 Units (not administered)  insulin aspart (novoLOG) injection 0-5 Units (not administered)  methylPREDNISolone sodium succinate (SOLU-MEDROL) 40 mg/mL injection 40 mg (not administered)  mycophenolate (CELLCEPT) capsule 250 mg (not administered)  potassium chloride SA (K-DUR,KLOR-CON) CR tablet 40 mEq (not administered)  ipratropium-albuterol (DUONEB) 0.5-2.5 (3) MG/3ML nebulizer solution 3 mL (not administered)  oseltamivir (TAMIFLU) capsule 75 mg (not administered)  vancomycin (VANCOCIN) IVPB 750 mg/150 ml premix (not administered)  piperacillin-tazobactam (ZOSYN) IVPB 3.375 g (not administered)  Warfarin - Pharmacist Dosing Inpatient (not administered)  sodium chloride 0.9 % bolus 1,000 mL (0 mLs Intravenous Stopped 07/16/17 1751)  acetaminophen (TYLENOL) tablet 650 mg (650 mg Oral Given 07/16/17 1554)  cefTRIAXone (ROCEPHIN) 1 g in dextrose 5 % 50 mL IVPB (0 g Intravenous Stopped 07/16/17 1751)  sodium chloride 0.9 % bolus 1,000 mL (1,000 mLs Intravenous New Bag/Given 07/16/17 1744)    Mobility non-ambulatory

## 2017-07-16 NOTE — ED Provider Notes (Signed)
[place] DEPT Provider Note   CSN: [id number] Arrival date & time: 07/16/17  1206     History   Chief Complaint Chief Complaint  Patient presents with  . Abdominal Pain    HPI Connie Ruiz is a 57 y.o. female.  HPI Patient is a 57 year old female who is the recipient of a renal transplant who presents to the emergency department with nausea vomiting and diarrhea and crampy abdominal pain as well as cough and shortness of breath over the past several days.  Decreased oral intake.  Still making urine.  Denies pain over her transplanted kidney.  Symptoms are moderate in severity.  100.3 oral temp on arrival to the emergency department.   Past Medical History:  Diagnosis Date  . Candida esophagitis ([place]) 11/12/2014  . CEREBROVASCULAR ACCIDENT, ACUTE 04/15/2010  . CLOSTRIDIUM DIFFICILE COLITIS, HX OF 08/21/2007  . CONGESTIVE HEART FAILURE 08/21/2007  . Current use of long term anticoagulation    Dr. Andree Elk, [facility]  . CVA 04/17/2010  . Depression    Dr. Andree Elk, [place], TYPE II 08/21/2007  . DVT, HX OF 08/21/2007  . GERD 08/21/2007  . GOUT 08/21/2007  . History of stroke with residual effects   . HYPERLIPIDEMIA 08/21/2007  . HYPERTENSION 08/21/2007   Dr. Andree Elk, [place], HX OF 08/22/2007   s/p renal transplant-Dr. Andree Elk, [place] 08/21/2007  . OSTEOPOROSIS 08/21/2007   Rheumatol at [facility]  . Pulmonary embolism ([place]) 07/16/2010  . Renal failure   . RENAL INSUFFICIENCY 08/21/2007  . Right sided weakness   . Steroid-induced hyperglycemia 11/09/2014  . Tachycardia   . THYROID NODULE, LEFT 04/10/2009    Patient Active Problem List   Diagnosis Date Noted  . Long term (current) use of anticoagulants [Z79.01] 06/21/2017  . Screening examination for infectious disease 01/28/2016  . Diabetes ([place]) 08/03/2015  . Vascular dementia 05/08/2015  . Hemiparesis as late effect of cerebrovascular accident (CVA)  ([place]) 05/08/2015  . Aphasia as late effect of stroke 05/08/2015  . C. difficile colitis 02/25/2015  . Hypokalemia 02/25/2015  . CKD (chronic kidney disease) 02/25/2015  . Colitis 02/14/2015  . Sepsis ([place]) 02/14/2015  . Nausea vomiting and diarrhea 02/14/2015  . Abdominal pain 02/14/2015  . UTI (lower urinary tract infection) 02/14/2015  . Candida esophagitis ([place]) 11/12/2014  . Abnormal CT of the abdomen   . Steroid-induced hyperglycemia 11/09/2014  . Hypomagnesemia 11/06/2014  . Abdominal pain, acute   . Supratherapeutic INR 11/02/2014  . Jejunitis 11/02/2014  . Myalgia and myositis 04/05/2014  . Wellness examination 04/05/2014  . Menopausal state 04/05/2014  . Palpitations 08/31/2013  . Hyperlipidemia 08/31/2013  . Disorder of heart rhythm 08/13/2013  . TIA (transient ischemic attack) 06/20/2013  . Gout flare: R elbow and R shoulder 06/20/2013  . Acute encephalopathy 06/14/2013  . Rhabdomyolysis 06/14/2013  . History of stroke with residual effects   . Right sided weakness   . Breast mass, left 04/30/2013  . Contusion of knee, left 10/18/2012  . Depression 10/05/2012  . Encounter for long-term (current) use of other medications 10/04/2012  . DVT (deep venous thrombosis) ([place]) 06/17/2011  . Expressive aphasia 06/17/2011  . Dysphasia 06/11/2011  . Incontinence of urine 06/11/2011  . Hand pain, left 06/11/2011  . Arm pain, left 05/07/2011  . Routine general medical examination at a health care facility 04/13/2011  . Anticoagulated 01/08/2011  . Pulmonary embolism ([place]) 07/16/2010  . Cerebral artery occlusion with  cerebral infarction ([place]) 04/17/2010  . CEREBROVASCULAR ACCIDENT, ACUTE 04/15/2010  . THYROID NODULE, LEFT 04/10/2009  . COUGH 03/26/2009  . ACUTE BRONCHITIS 08/22/2007  . KIDNEY TRANSPLANTATION, HX OF 08/22/2007  . Gout 08/21/2007  . Essential hypertension 08/21/2007  . Chronic diastolic congestive heart failure ([place]) 08/21/2007  . GERD 08/21/2007  .  Disorder resulting from impaired renal function 08/21/2007  . LUPUS 08/21/2007  . Osteoporosis 08/21/2007  . DVT, HX OF 08/21/2007  . Enteritis due to Clostridium difficile 08/21/2007    Past Surgical History:  Procedure Laterality Date  . CHOLECYSTECTOMY    . ENTEROSCOPY N/A 11/11/2014   Procedure: ENTEROSCOPY;  Surgeon: Ladene Artist, MD;  Location: [facility] END[facility];  Service: Endoscopy;  Laterality: N/A;  . KIDNEY TRANSPLANT Right 2009  . RENAL BIOPSY, OPEN  1981  . TUBAL LIGATION      OB History    No data available       Home Medications    Prior to Admission medications   Medication Sig Start Date End Date Taking? Authorizing Provider  alendronate (FOSAMAX) 70 MG tablet Take 1 tablet (70 mg total) by mouth once a week. Take on Saturdays with a full glass of water on an empty stomach. 09/27/16  Yes Renato Shin, MD  atorvastatin (LIPITOR) 40 MG tablet Take 1 tablet (40 mg total) by mouth daily. 01/26/17  Yes Renato Shin, MD  Blood Glucose Monitoring Suppl (ONETOUCH VERIO IQ SYSTEM) W/DEVICE KIT Use to check blood sugar 1 time per day 02/12/15  Yes Renato Shin, MD  calcitRIOL (ROCALTROL) 0.25 MCG capsule TAKE 1 CAPSULE(0.25 MCG) BY MOUTH DAILY 07/07/17  Yes Renato Shin, MD  diltiazem [facility]) 240 MG 24 hr capsule Take 1 capsule (240 mg total) by mouth daily. 10/29/15  Yes Renato Shin, MD  donepezil (ARICEPT) 10 MG tablet TAKE 1 TABLET(10 MG) BY MOUTH AT BEDTIME 11/18/16  Yes Jaffe, Adam R, DO  esomeprazole (NEXIUM) 20 MG capsule Take 20 mg by mouth daily. 03/21/15  Yes [provider]  glucose blood (ONE TOUCH ULTRA TEST) test strip Use to check blood sugar 1 time per day 02/12/15  Yes Renato Shin, MD  levothyroxine (SYNTHROID, LEVOTHROID) 125 MCG tablet Take 1 tablet (125 mcg total) by mouth daily. 06/24/17  Yes Renato Shin, MD  losartan (COZAAR) 100 MG tablet Take 1 tablet (100 mg total) by mouth daily. 06/03/17  Yes Renato Shin, MD  metFORMIN (GLUCOPHAGE-XR) 500  MG 24 hr tablet Take 1 tablet (500 mg total) by mouth daily with breakfast. 06/27/17  Yes Renato Shin, MD  mirtazapine (REMERON) 15 MG tablet TAKE 1 TABLET(15 MG) BY MOUTH AT BEDTIME 06/20/17  Yes Jaffe, Adam R, DO  mirtazapine (REMERON) 30 MG tablet Take 1 tablet (30 mg total) by mouth at bedtime. 07/28/15  Yes Jaffe, Adam R, DO  mycophenolate (CELLCEPT) 250 MG capsule Take 250-500 mg by mouth 2 (two) times daily. 500 mg QAM and 250 mg in the evening   Yes [provider]  nystatin (MYCOSTATIN) powder Apply 1 g topically 4 (four) times daily as needed. For yeast under breast   Yes [provider]  ondansetron (ZOFRAN ODT) 4 MG disintegrating tablet 11m ODT q4 hours prn nausea/vomiting 06/15/15  Yes Mesner, JCorene Cornea MD  [facility] [id number] Use to check blood sugar 1 time per day 02/12/15  Yes ERenato Shin MD  oxybutynin (DITROPAN) 5 MG tablet Take 5 mg by mouth daily as needed for bladder spasms.    Yes [provider]  predniSONE (DELTASONE) 5 MG tablet Take 5 mg by mouth daily. 06/30/17  Yes [provider]  QUEtiapine (SEROQUEL) 25 MG tablet Take 1 tablet (25 mg total) by mouth at bedtime. 07/28/15  Yes Jaffe, Adam R, DO  sertraline (ZOLOFT) 100 MG tablet Take 0.5 tablets (50 mg total) by mouth daily. 01/25/17  Yes Renato Shin, MD  tacrolimus (PROGRAF) 1 MG capsule Take 1 mg by mouth 2 (two) times daily.  04/16/14  Yes [provider]  warfarin (COUMADIN) 5 MG tablet TAKE 1 TABLET BY MOUTH DAILY OR AS DIRECTED BY COUMADIN CLINIC 04/12/17  Yes Lorretta Harp, MD  enoxaparin (LOVENOX) 80 MG/0.8ML injection Inject 0.8 mLs (80 mg total) into the skin every 12 (twelve) hours. As instructed by Coumadin Clinic Patient not taking: Reported on 07/16/2017 11/25/16   Lorretta Harp, MD  levothyroxine (SYNTHROID) 125 MCG tablet Take 1 tablet (125 mcg total) by mouth daily before breakfast. Patient not taking: Reported on 07/16/2017 06/15/17   Renato Shin, MD   traMADol (ULTRAM) 50 MG tablet Take 1 tablet (50 mg total) by mouth every 8 (eight) hours as needed. Patient not taking: Reported on 07/16/2017 07/07/15   Renato Shin, MD    Family History Family History  Problem Relation Age of Onset  . Heart attack Mother   . Heart disease Father   . Asthma Sister   . Asthma Sister   . Asthma Daughter   . Cancer Maternal Grandfather        prostate  . Cancer Paternal Grandfather        colon    Social History Social History   Tobacco Use  . Smoking status: Never Smoker  . Smokeless tobacco: Never Used  Substance Use Topics  . Alcohol use: No    Alcohol/week: 0.0 oz  . Drug use: No     Allergies   Oxycodone-acetaminophen; Propoxyphene n-acetaminophen; Sulfonamide derivatives; Adhesive [tape]; Codeine; Other; Gabapentin; Latex; Metoprolol; Morphine and related; and Rosiglitazone   Review of Systems Review of Systems  All other systems reviewed and are negative.    Physical Exam Updated Vital Signs BP (!) 140/95 (BP Location: Left Arm)   Pulse (!) 129   Temp 100.3 F (37.9 C) (Oral)   Resp (!) 22   SpO2 93%   Physical Exam  Constitutional: She is oriented to person, place, and time. She appears well-developed and well-nourished. No distress.  HENT:  Head: Normocephalic and atraumatic.  Eyes: EOM are normal.  Neck: Normal range of motion.  Cardiovascular: Normal rate, regular rhythm and normal heart sounds.  Pulmonary/Chest: Effort normal and breath sounds normal.  Abdominal: Soft. She exhibits no distension. There is no tenderness.  Musculoskeletal: Normal range of motion.  Neurological: She is alert and oriented to person, place, and time.  Skin: Skin is warm and dry.  Psychiatric: She has a normal mood and affect. Judgment normal.  Nursing note and vitals reviewed.    ED Treatments / Results  Labs (all labs ordered are listed, but only abnormal results are displayed) Labs Reviewed  COMPREHENSIVE METABOLIC PANEL  - Abnormal; Notable for the following components:      Result Value   Sodium 134 (*)    Potassium 3.4 (*)    CO2 20 (*)    Glucose, Bld 131 (*)    Calcium 8.4 (*)    Albumin 3.3 (*)    Total Bilirubin 1.4 (*)    All other components within normal limits  CBC WITH DIFFERENTIAL/PLATELET - Abnormal; Notable for the following components:   Platelets 145 (*)    Monocytes Absolute 1.1 (*)    All other components within normal limits  CULTURE, BLOOD (ROUTINE X 2)  CULTURE, BLOOD (ROUTINE X 2)  URINALYSIS, ROUTINE W REFLEX MICROSCOPIC  INFLUENZA PANEL BY PCR (TYPE A & B)  I-STAT CG4 LACTIC ACID, ED  I-STAT TROPONIN, ED    EKG  EKG Interpretation  Date/Time:  Saturday July 16 2017 12:29:10 EST Ventricular Rate:  126 PR Interval:    QRS Duration: 93 QT Interval:  318 QTC Calculation: 461 R Axis:   117 Text Interpretation:  Sinus tachycardia Left posterior fascicular block Abnormal R-wave progression, late transition Nonspecific T abnormalities, inferior leads No significant change was found Confirmed by Jola Schmidt [phone]) on 07/16/2017 3:10:51 PM       Radiology Dg Chest 2 View  Result Date: 07/16/2017 CLINICAL DATA:  57 year old female with cough, congestion, fever and body aches for the past 4 days EXAM: CHEST  2 VIEW COMPARISON:  Prior chest x-ray 09/23/2016 FINDINGS: New patchy airspace opacity in the right lower lobe concerning for bronchopneumonia given the clinical history. There is associated elevation of the right hemidiaphragm. Stable mild cardiomegaly. Background bronchitic changes and interstitial prominence are similar compared to prior. No acute osseous abnormality. IMPRESSION: 1. Right lower lobe pneumonia. Followup PA and lateral chest X-ray is recommended in 3-4 weeks following trial of antibiotic therapy to ensure resolution and exclude underlying malignancy. 2. Elevation of the right hemidiaphragm likely secondary to right lower lobe volume loss. Electronically  Signed   By: Jacqulynn Cadet M.D.   On: 07/16/2017 13:31    Procedures Procedures (including critical care time)  Medications Ordered in ED Medications  cefTRIAXone (ROCEPHIN) 1 g in dextrose 5 % 50 mL IVPB (1 g Intravenous New Bag/Given 07/16/17 1547)  azithromycin (ZITHROMAX) 500 mg in dextrose 5 % 250 mL IVPB (not administered)  sodium chloride 0.9 % bolus 1,000 mL (1,000 mLs Intravenous New Bag/Given 07/16/17 1550)  acetaminophen (TYLENOL) tablet 650 mg (650 mg Oral Given 07/16/17 1554)     Initial Impression / Assessment and Plan / ED Course  I have reviewed the triage vital signs and the nursing notes.  Pertinent labs & imaging results that were available during my care of the patient were reviewed by me and considered in my medical decision making (see chart for details).     Patient with acute right lower lobe pneumonia in the setting of suppression given her history of renal transplant.  Fortunately her kidney function is still normal.  Given decreased oral intake to the weekend I feel like she is dehydrated and will benefit from IV fluids.  IV antibiotics now.  Complex past medical history and a high risk for return to the emergency department if discharged home.  I feel like overnight observation in the hospital with IV antibiotics and IV fluids would be beneficial for this complex patient   Final Clinical Impressions(s) / ED Diagnoses   Final diagnoses:  Community acquired pneumonia, unspecified laterality  Renal transplant recipient  Dehydration    ED Discharge Orders    None       Jola Schmidt, MD 07/16/17 1610

## 2017-07-16 NOTE — ED Notes (Signed)
2x unsuccessful in and out cath,

## 2017-07-16 NOTE — Progress Notes (Signed)
[facility] for Warfarin Indication: Hx PE, Hx CVA  Allergies  Allergen Reactions  . Oxycodone-Acetaminophen Shortness Of Breath and Nausea Only  . Propoxyphene N-Acetaminophen Shortness Of Breath and Nausea Only  . Sulfonamide Derivatives Shortness Of Breath and Nausea Only  . Adhesive [Tape]   . Codeine Nausea Only  . Other Other (See Comments)    No blood, Jehovaeh Witness   . Gabapentin Anxiety    twitching  . Latex Rash  . Metoprolol Rash  . Morphine And Related Rash    IV site on arm is red, patient reports this is improving.  NO shortness of breath reported.  . Rosiglitazone Rash    Patient Measurements:    Vital Signs: Temp: 100.3 F (37.9 C) (02/09 1229) Temp Source: Oral (02/09 1229) BP: 138/94 (02/09 1530) Pulse Rate: 128 (02/09 1530)  Labs: Recent Labs    07/16/17 1240  HGB 14.2  HCT 42.7  PLT 145*  CREATININE 0.73    CrCl cannot be calculated (Unknown ideal weight.).   Medical History: Past Medical History:  Diagnosis Date  . Candida esophagitis ([place]) 11/12/2014  . CEREBROVASCULAR ACCIDENT, ACUTE 04/15/2010  . CLOSTRIDIUM DIFFICILE COLITIS, HX OF 08/21/2007  . CONGESTIVE HEART FAILURE 08/21/2007  . Current use of long term anticoagulation    Dr. Andree Elk, [facility]  . CVA 04/17/2010  . Depression    Dr. Andree Elk, [place], TYPE II 08/21/2007  . DVT, HX OF 08/21/2007  . GERD 08/21/2007  . GOUT 08/21/2007  . History of stroke with residual effects   . HYPERLIPIDEMIA 08/21/2007  . HYPERTENSION 08/21/2007   Dr. Andree Elk, [place], HX OF 08/22/2007   s/p renal transplant-Dr. Andree Elk, [place] 08/21/2007  . OSTEOPOROSIS 08/21/2007   Rheumatol at [facility]  . Pulmonary embolism ([place]) 07/16/2010  . Renal failure   . RENAL INSUFFICIENCY 08/21/2007  . Right sided weakness   . Steroid-induced hyperglycemia 11/09/2014  . Tachycardia   . THYROID NODULE, LEFT 04/10/2009    Medications:   Scheduled:  . atorvastatin  40 mg Oral Daily  . calcitRIOL  0.25 mcg Oral Daily  . diltiazem  240 mg Oral Daily  . donepezil  10 mg Oral QHS  . insulin aspart  0-5 Units Subcutaneous QHS  . [START ON 07/17/2017] insulin aspart  0-9 Units Subcutaneous TID WC  . ipratropium-albuterol  3 mL Nebulization TID  . [START ON 07/17/2017] levothyroxine  125 mcg Oral QAC breakfast  . losartan  100 mg Oral Daily  . methylPREDNISolone (SOLU-MEDROL) injection  40 mg Intravenous Q8H  . mirtazapine  30 mg Oral QHS  . mycophenolate  250 mg Oral QHS  . [START ON 07/17/2017] mycophenolate  500 mg Oral Daily  . oseltamivir  75 mg Oral BID  . pantoprazole  40 mg Oral Daily  . potassium chloride  40 mEq Oral Once  . QUEtiapine  25 mg Oral QHS  . sertraline  50 mg Oral Daily  . tacrolimus  1 mg Oral BID  . [START ON 07/17/2017] Warfarin - Pharmacist Dosing Inpatient   Does not apply q1800   Infusions:  . sodium chloride    . piperacillin-tazobactam (ZOSYN)  IV    . vancomycin      Assessment: 27 yoF admitted on 2/9 with SOB, cough, concerning for pneumonia.  PMH is significant for PE and CVA on chronic warfarin anticoagulation.  Pharmacy is consulted to continue inpatient dosing.  PTA  Warfarin reported as 5mg  daily except 2.5 mg on Wed/Sat.  Admission INR 1.19 Last [facility] office anticoag visit on 07/08/17 showed INR 3.1 with recommended change to warfarin 5mg  daily except 2.5 mg on M/W/F.  Today, 07/16/2017: INR 1.19 CBC: Hgb WNL, Plt 145k No major drug-drug interactions noted, broad spectrum antibiotics may lead to prolonged INR.   Goal of Therapy:  INR 2-3 Monitor platelets by anticoagulation protocol: Yes   Plan:  Warfarin 5 mg PO x 1. Daily PT/INR. Monitor for signs and symptoms of bleeding.  Gretta Arab PharmD, BCPS Pager [phone] 07/16/2017 5:46 PM

## 2017-07-16 NOTE — Progress Notes (Signed)
Pharmacy Antibiotic Note  Connie Ruiz is a 57 y.o. female admitted on 07/16/2017 with pneumonia.  Pharmacy has been consulted for Vancomycin and Zosyn dosing.  SCr 0.73 (Hx of renal transplant)  Lactic acid: 1.56 > 0.82 WBC 8 Tm  100.3  Plan:  Zosyn 3.375g IV Q8H infused over 4hrs.  Vancomycin 750 mg IV q12h.  Measure Vanc peak, trough at steady state.  Follow up renal fxn, culture results, and clinical course.  Start oseltamivir 75 mg PO BID x5 days for positive influenza  Check MRSA PCR   Daily SCr - monitor closely d/t nephrotoxic potential of vancomycin and Zosyn and history fo renal transplant.  [place] ASAP.        Last documented Ht 1.643m, Wt 78.7 kg   Temp (24hrs), Avg:100.3 F (37.9 C), Min:100.3 F (37.9 C), Max:100.3 F (37.9 C)  Recent Labs  Lab 07/16/17 1240 07/16/17 1247  WBC 8.0  --   CREATININE 0.73  --   LATICACIDVEN  --  1.56    CrCl cannot be calculated (Unknown ideal weight.).    Allergies  Allergen Reactions  . Oxycodone-Acetaminophen Shortness Of Breath and Nausea Only  . Propoxyphene N-Acetaminophen Shortness Of Breath and Nausea Only  . Sulfonamide Derivatives Shortness Of Breath and Nausea Only  . Adhesive [Tape]   . Codeine Nausea Only  . Other Other (See Comments)    No blood, Jehovaeh Witness   . Gabapentin Anxiety    twitching  . Latex Rash  . Metoprolol Rash  . Morphine And Related Rash    IV site on arm is red, patient reports this is improving.  NO shortness of breath reported.  . Rosiglitazone Rash    Antimicrobials this admission: 2/9 Vancomycin >>  2/9 Zosyn >>  2/9 Oseltamivir >>   Dose adjustments this admission:   Microbiology results: 2/9 BCx:  UCx:    Sputum:   2/9 MRSA PCR:  2/9 Influenza A positive  Thank you for allowing pharmacy to be a part of this patient's care.  Gretta Arab PharmD, BCPS Pager [phone] 07/16/2017 5:49 PM

## 2017-07-17 ENCOUNTER — Other Ambulatory Visit: Payer: Self-pay

## 2017-07-17 ENCOUNTER — Other Ambulatory Visit: Payer: Self-pay | Admitting: Cardiovascular Disease

## 2017-07-17 DIAGNOSIS — J111 Influenza due to unidentified influenza virus with other respiratory manifestations: Secondary | ICD-10-CM

## 2017-07-17 DIAGNOSIS — R000 Tachycardia, unspecified: Secondary | ICD-10-CM

## 2017-07-17 LAB — GLUCOSE, CAPILLARY
Glucose-Capillary: 230 mg/dL — ABNORMAL HIGH (ref 65–99)
Glucose-Capillary: 280 mg/dL — ABNORMAL HIGH (ref 65–99)
Glucose-Capillary: 220 mg/dL — ABNORMAL HIGH (ref 65–99)
Glucose-Capillary: 203 mg/dL — ABNORMAL HIGH (ref 65–99)

## 2017-07-17 LAB — CBC
HCT: 36.8 % (ref 36.0–46.0)
Hemoglobin: 12.6 g/dL (ref 12.0–15.0)
MCH: 30.4 pg (ref 26.0–34.0)
MCHC: 34.2 g/dL (ref 30.0–36.0)
MCV: 88.9 fL (ref 78.0–100.0)
Platelets: 116 10*3/uL — ABNORMAL LOW (ref 150–400)
RBC: 4.14 MIL/uL (ref 3.87–5.11)
RDW: 14.3 % (ref 11.5–15.5)
WBC: 4.2 10*3/uL (ref 4.0–10.5)

## 2017-07-17 LAB — COMPREHENSIVE METABOLIC PANEL
ALT: 15 U/L (ref 14–54)
AST: 24 U/L (ref 15–41)
Albumin: 2.6 g/dL — ABNORMAL LOW (ref 3.5–5.0)
Alkaline Phosphatase: 55 U/L (ref 38–126)
Anion gap: 7 (ref 5–15)
BUN: 12 mg/dL (ref 6–20)
CO2: 19 mmol/L — ABNORMAL LOW (ref 22–32)
Calcium: 7.8 mg/dL — ABNORMAL LOW (ref 8.9–10.3)
Chloride: 110 mmol/L (ref 101–111)
Creatinine, Ser: 0.66 mg/dL (ref 0.44–1.00)
GFR calc Af Amer: >60 mL/min (ref >60)
GFR calc non Af Amer: >60 mL/min (ref >60)
Glucose, Bld: 258 mg/dL — ABNORMAL HIGH (ref 65–99)
Potassium: 3.6 mmol/L (ref 3.5–5.1)
Sodium: 136 mmol/L (ref 135–145)
Total Bilirubin: 1.1 mg/dL (ref 0.3–1.2)
Total Protein: 5.8 g/dL — ABNORMAL LOW (ref 6.5–8.1)

## 2017-07-17 LAB — HIV ANTIBODY (ROUTINE TESTING W REFLEX): HIV Screen 4th Generation wRfx: NONREACTIVE

## 2017-07-17 LAB — PROTIME-INR
INR: 1.28
Prothrombin Time: 15.9 seconds — ABNORMAL HIGH (ref 11.4–15.2)

## 2017-07-17 LAB — MRSA PCR SCREENING: MRSA by PCR: NEGATIVE

## 2017-07-17 MED ORDER — TRAMADOL HCL 50 MG PO TABS
50.0000 mg | ORAL_TABLET | Freq: Four times a day (QID) | ORAL | Status: DC | PRN
  Administered 2017-07-17 – 2017-07-19 (×5): 50 mg via ORAL
  Filled 2017-07-17 (×5): qty 1

## 2017-07-17 MED ORDER — DILTIAZEM HCL 25 MG/5ML IV SOLN
10.0000 mg | Freq: Once | INTRAVENOUS | Status: AC
  Administered 2017-07-17: 10 mg via INTRAVENOUS
  Filled 2017-07-17: qty 5

## 2017-07-17 MED ORDER — WARFARIN SODIUM 5 MG PO TABS
5.0000 mg | ORAL_TABLET | Freq: Once | ORAL | Status: AC
  Administered 2017-07-17: 5 mg via ORAL
  Filled 2017-07-17: qty 1

## 2017-07-17 MED ORDER — SODIUM CHLORIDE 0.9 % IV BOLUS (SEPSIS)
1000.0000 mL | Freq: Once | INTRAVENOUS | Status: AC
  Administered 2017-07-17: 1000 mL via INTRAVENOUS

## 2017-07-17 NOTE — Progress Notes (Signed)
  Pt is a difficult  stick, lab was unable to get some of the am labs ordered for today. At times the Pt does refuse to get her labs drawn, due to multiple attempst. I will continue to monitor and reeducate.

## 2017-07-17 NOTE — Progress Notes (Signed)
PROGRESS NOTE    Connie Ruiz  VWP:[id number] DOB: 11/24/1960 DOA: 07/16/2017 PCP: Renato Shin, MD   Brief Narrative:   57 year old female with a history of CVA on Coumadin, hokum, hypothyroidism, renal transplant, [name], GERD, essential hypertension came to the hospital with complains of cough and shortness of breath.  She was found to have a right lower lobe pneumonia and was flu positive.  She was started on Rocephin and azithromycin along with Tamiflu.  Antibiotics were changed from Rocephin and and azithromycin to vancomycin and Zosyn given her bacteremia compromised state.  Her MRSA swab was negative therefore vancomycin was discontinued.    Assessment & Plan:   Active Problems:   CAP (community acquired pneumonia)   Acute respiratory distress Influenza positive Right lower lobe community-acquired pneumonia - Cultures have thus far remain negative - We will continue patient on Zosyn.  MRSA swab negative therefore vancomycin discontinued -Continue nebulizer treatments, Solu-Medrol 40 mg every 8 hours -Tamiflu has been ordered -Robitussin as needed -Follow-up cultures -UA is negative for urinary tract infection  Sinus tachycardia -Likely secondary to feverand some dehydration - Will order 1 L of normal saline bolus followed by maintenance fluid, monitor urine output -Tylenol given  Elevated total bilirubin -Was 1.4 yesterday, today it is trended down to 1.1.  No further workup  History of systemic lupus ESRD on hemodialysis for 5 years status post renal transplant in 2005 -Renal function appears to be at baseline at 0.7.  Continue to monitor urine output -Continue her CellCept and Prograf.  Holding off on prednisone as patient is currently on Solu-Medrol IV.  During this time will continue Accu-Cheks and sliding scale  Diabetes mellitus type 2 -Hold metformin, Accu-Cheks and sliding scale  History of CVA with residual expressive aphasia Coumadin, pharmacy  to dose.  Essential hypertension -Resume home medications Cozaar 100 mils 240 mg orally daily  Hypothyroidism 25 mcg orally daily  GERD -Nexium  Patient is a [name].   DVT prophylaxis: Continue Coumadin Code Status: Full code Family Communication: None at bedside Disposition Plan: Maintain stepdown stay for another 24 hours until her vital signs have improved.  Consultants:   none  Procedures:   None  Antimicrobials:   Azithromycin 2/9 1 dose  Rocephin 2/9 1 dose  vancomycin 2/9 > 2/10  Zosyn 2/9>   Subjective: Reports she feels about the same in terms of myalgia and arthralgia.  Shortness of breath is improved otherwise no new complaints.  Objective: Vitals:   07/17/17 0400 07/17/17 0600 07/17/17 0800 07/17/17 0858  BP: (!) 153/84 (!) 159/90 (!) 167/103   Pulse: 99 95 (!) 101   Resp: 14 16 (!) 21   Temp:   98.9 F (37.2 C)   TempSrc:   Oral   SpO2: 99% 95% 100% 96%  Weight:  78.7 kg (173 lb 8 oz)    Height:  5\' 4"  (1.626 m)      Intake/Output Summary (Last 24 hours) at 07/17/2017 1118 Last data filed at 07/17/2017 1000 Gross per 24 hour  Intake 4351.67 ml  Output 501 ml  Net 3850.67 ml   Filed Weights   07/17/17 0600  Weight: 78.7 kg (173 lb 8 oz)    Examination:  General exam: In mild distress due to arthralgia and myalgia. Respiratory system: Diffuse diminished breath sounds especially at the right lower lung base Cardiovascular system: S1 & S2 heard, RRR. No JVD, murmurs, rubs, gallops or clicks. No pedal edema. Gastrointestinal system: Abdomen is nondistended,  soft and nontender. No organomegaly or masses felt. Normal bowel sounds heard. Central nervous system: Alert and oriented.  Has some baseline expressive aphasia otherwise no other focal neurologic defects Extremities: Symmetric 5 x 5 power. Skin: No rashes, lesions or ulcers Psychiatry: Judgement and insight appear normal. Mood & affect appropriate.     Data Reviewed:    CBC: Recent Labs  Lab 07/16/17 1240 07/17/17 0328  WBC 8.0 4.2  NEUTROABS 4.6  --   HGB 14.2 12.6  HCT 42.7 36.8  MCV 89.9 88.9  PLT 145* 779*   Basic Metabolic Panel: Recent Labs  Lab 07/16/17 1240 07/17/17 0328  NA 134* 136  K 3.4* 3.6  CL 103 110  CO2 20* 19*  GLUCOSE 131* 258*  BUN 14 12  CREATININE 0.73 0.66  CALCIUM 8.4* 7.8*   GFR: Estimated Creatinine Clearance: 79.7 mL/min (by C-G formula based on SCr of 0.66 mg/dL). Liver Function Tests: Recent Labs  Lab 07/16/17 1240 07/17/17 0328  AST 25 24  ALT 15 15  ALKPHOS 70 55  BILITOT 1.4* 1.1  PROT 6.9 5.8*  ALBUMIN 3.3* 2.6*   No results for input(s): LIPASE, AMYLASE in the last 168 hours. No results for input(s): AMMONIA in the last 168 hours. Coagulation Profile: Recent Labs  Lab 07/16/17 1744  INR 1.19   Cardiac Enzymes: No results for input(s): CKTOTAL, CKMB, CKMBINDEX, TROPONINI in the last 168 hours. BNP (last 3 results) No results for input(s): PROBNP in the last 8760 hours. HbA1C: No results for input(s): HGBA1C in the last 72 hours. CBG: Recent Labs  Lab 07/16/17 2146 07/17/17 0753  GLUCAP 170* 230*   Lipid Profile: No results for input(s): CHOL, HDL, LDLCALC, TRIG, CHOLHDL, LDLDIRECT in the last 72 hours. Thyroid Function Tests: No results for input(s): TSH, T4TOTAL, FREET4, T3FREE, THYROIDAB in the last 72 hours. Anemia Panel: No results for input(s): VITAMINB12, FOLATE, FERRITIN, TIBC, IRON, RETICCTPCT in the last 72 hours. Sepsis Labs: Recent Labs  Lab 07/16/17 1247 07/16/17 1750  LATICACIDVEN 1.56 0.82    Recent Results (from the past 240 hour(s))  MRSA PCR Screening     Status: None   Collection Time: 07/16/17 11:23 PM  Result Value Ref Range Status   MRSA by PCR NEGATIVE NEGATIVE Final    Comment:        The GeneXpert MRSA Assay (FDA approved for NASAL specimens only), is one component of a comprehensive MRSA colonization surveillance program. It is  not intended to diagnose MRSA infection nor to guide or monitor treatment for MRSA infections. Performed at [facility], [place] [address] [place], [place] [address]          Radiology Studies: Dg Chest 2 View  Result Date: 07/16/2017 CLINICAL DATA:  57 year old female with cough, congestion, fever and body aches for the past 4 days EXAM: CHEST  2 VIEW COMPARISON:  Prior chest x-ray 09/23/2016 FINDINGS: New patchy airspace opacity in the right lower lobe concerning for bronchopneumonia given the clinical history. There is associated elevation of the right hemidiaphragm. Stable mild cardiomegaly. Background bronchitic changes and interstitial prominence are similar compared to prior. No acute osseous abnormality. IMPRESSION: 1. Right lower lobe pneumonia. Followup PA and lateral chest X-ray is recommended in 3-4 weeks following trial of antibiotic therapy to ensure resolution and exclude underlying malignancy. 2. Elevation of the right hemidiaphragm likely secondary to right lower lobe volume loss. Electronically Signed   By: Jacqulynn Cadet M.D.   On: 07/16/2017 13:31  Scheduled Meds: . atorvastatin  40 mg Oral Daily  . calcitRIOL  0.25 mcg Oral Daily  . diltiazem  240 mg Oral Daily  . donepezil  10 mg Oral QHS  . insulin aspart  0-5 Units Subcutaneous QHS  . insulin aspart  0-9 Units Subcutaneous TID WC  . ipratropium-albuterol  3 mL Nebulization TID  . levothyroxine  125 mcg Oral QAC breakfast  . losartan  100 mg Oral Daily  . methylPREDNISolone (SOLU-MEDROL) injection  40 mg Intravenous Q8H  . mirtazapine  30 mg Oral QHS  . mycophenolate  250 mg Oral QHS  . mycophenolate  500 mg Oral Daily  . oseltamivir  75 mg Oral BID  . pantoprazole  40 mg Oral Daily  . potassium chloride  40 mEq Oral Once  . QUEtiapine  25 mg Oral QHS  . sertraline  50 mg Oral Daily  . tacrolimus  1 mg Oral BID  . Warfarin - Pharmacist Dosing Inpatient   Does not apply q1800    Continuous Infusions: . sodium chloride 100 mL/hr at 07/16/17 1923  . piperacillin-tazobactam (ZOSYN)  IV Stopped (07/17/17 0308)  . vancomycin Stopped (07/17/17 0954)     LOS: 1 day    Time spent: 32 mins    Ankit Arsenio Loader, MD [facility] Pager [phone]   If 7PM-7AM, please contact night-coverage [email or web address] Password [email or web address] 07/17/2017, 11:18 AM

## 2017-07-17 NOTE — Progress Notes (Signed)
[place] for Warfarin Indication: Hx PE, Hx CVA  Allergies  Allergen Reactions  . Oxycodone-Acetaminophen Shortness Of Breath and Nausea Only  . Propoxyphene N-Acetaminophen Shortness Of Breath and Nausea Only  . Sulfonamide Derivatives Shortness Of Breath and Nausea Only  . Adhesive [Tape]   . Codeine Nausea Only  . Other Other (See Comments)    No blood, Jehovaeh Witness   . Gabapentin Anxiety    twitching  . Latex Rash  . Metoprolol Rash  . Morphine And Related Rash    IV site on arm is red, patient reports this is improving.  NO shortness of breath reported.  . Rosiglitazone Rash    Patient Measurements: Height: 5\' 4"  (162.6 cm) Weight: 173 lb 8 oz (78.7 kg) IBW/kg (Calculated) : 54.7  Vital Signs: Temp: 98.9 F (37.2 C) (02/10 0800) Temp Source: Oral (02/10 0800) BP: 167/103 (02/10 0800) Pulse Rate: 101 (02/10 0800)  Labs: Recent Labs    07/16/17 1240 07/16/17 1744 07/17/17 0328  HGB 14.2  --  12.6  HCT 42.7  --  36.8  PLT 145*  --  116*  LABPROT  --  15.0  --   INR  --  1.19  --   CREATININE 0.73  --  0.66    Estimated Creatinine Clearance: 79.7 mL/min (by C-G formula based on SCr of 0.66 mg/dL).   Medical History: Past Medical History:  Diagnosis Date  . Candida esophagitis ([place]) 11/12/2014  . CEREBROVASCULAR ACCIDENT, ACUTE 04/15/2010  . CLOSTRIDIUM DIFFICILE COLITIS, HX OF 08/21/2007  . CONGESTIVE HEART FAILURE 08/21/2007  . Current use of long term anticoagulation    Dr. Andree Elk, [facility]  . CVA 04/17/2010  . Depression    Dr. Andree Elk, [place], TYPE II 08/21/2007  . DVT, HX OF 08/21/2007  . GERD 08/21/2007  . GOUT 08/21/2007  . History of stroke with residual effects   . HYPERLIPIDEMIA 08/21/2007  . HYPERTENSION 08/21/2007   Dr. Andree Elk, [place], HX OF 08/22/2007   s/p renal transplant-Dr. Andree Elk, [place] 08/21/2007  . OSTEOPOROSIS 08/21/2007   [facility]heumatol at  [facility]  . Pulmonary embolism ([place]) 07/16/2010  . Renal failure   . RENAL INSUFFICIENCY 08/21/2007  . Right sided weakness   . Steroid-induced hyperglycemia 11/09/2014  . Tachycardia   . THYROID NODULE, LEFT 04/10/2009    Medications:  Scheduled:  . atorvastatin  40 mg Oral Daily  . calcitRIOL  0.25 mcg Oral Daily  . diltiazem  240 mg Oral Daily  . donepezil  10 mg Oral QHS  . insulin aspart  0-5 Units Subcutaneous QHS  . insulin aspart  0-9 Units Subcutaneous TID WC  . ipratropium-albuterol  3 mL Nebulization TID  . levothyroxine  125 mcg Oral QAC breakfast  . losartan  100 mg Oral Daily  . methylPREDNISolone (SOLU-MEDROL) injection  40 mg Intravenous Q8H  . mirtazapine  30 mg Oral QHS  . mycophenolate  250 mg Oral QHS  . mycophenolate  500 mg Oral Daily  . oseltamivir  75 mg Oral BID  . pantoprazole  40 mg Oral Daily  . potassium chloride  40 mEq Oral Once  . QUEtiapine  25 mg Oral QHS  . sertraline  50 mg Oral Daily  . tacrolimus  1 mg Oral BID  . Warfarin - Pharmacist Dosing Inpatient   Does not apply q1800   Infusions:  . sodium chloride 100 mL/hr at 07/16/17 1923  .  piperacillin-tazobactam (ZOSYN)  IV Stopped (07/17/17 0308)  . sodium chloride 1,000 mL (07/17/17 1142)    Assessment: 47 yoF admitted on 2/9 with SOB, cough, concerning for pneumonia.  PMH is significant for PE and CVA on chronic warfarin anticoagulation.  Pharmacy is consulted to continue inpatient dosing.  PTA Warfarin reported as 5mg  daily except 2.5 mg on Wed/Sat.  Admission INR 1.19 Last [facility] office anticoag visit on 07/08/17 showed INR 3.1 with recommended change to warfarin 5mg  daily except 2.5 mg on M/W/F.  Today, 07/17/2017: No INR today, see note from K. Simon RN CBC: Hgb WNL, Plt 116k No major drug-drug interactions noted, broad spectrum antibiotics may lead to prolonged INR.   Goal of Therapy:  INR 2-3 Monitor platelets by anticoagulation protocol: Yes   Plan:  Warfarin 5 mg PO x  1 as per home regiment Daily PT/INR. Monitor for signs and symptoms of bleeding.  Dolly Rias RPh 07/17/2017, 11:51 AM Pager [phone]

## 2017-07-18 LAB — COMPREHENSIVE METABOLIC PANEL
ALT: 15 U/L (ref 14–54)
AST: 18 U/L (ref 15–41)
Albumin: 3.1 g/dL — ABNORMAL LOW (ref 3.5–5.0)
Alkaline Phosphatase: 56 U/L (ref 38–126)
Anion gap: 10 (ref 5–15)
BUN: 13 mg/dL (ref 6–20)
CO2: 21 mmol/L — ABNORMAL LOW (ref 22–32)
Calcium: 8.6 mg/dL — ABNORMAL LOW (ref 8.9–10.3)
Chloride: 111 mmol/L (ref 101–111)
Creatinine, Ser: 0.68 mg/dL (ref 0.44–1.00)
GFR calc Af Amer: >60 mL/min (ref >60)
GFR calc non Af Amer: >60 mL/min (ref >60)
Glucose, Bld: 212 mg/dL — ABNORMAL HIGH (ref 65–99)
Potassium: 3.3 mmol/L — ABNORMAL LOW (ref 3.5–5.1)
Sodium: 142 mmol/L (ref 135–145)
Total Bilirubin: 0.8 mg/dL (ref 0.3–1.2)
Total Protein: 6.4 g/dL — ABNORMAL LOW (ref 6.5–8.1)

## 2017-07-18 LAB — CBC
HCT: 38.9 % (ref 36.0–46.0)
Hemoglobin: 12.7 g/dL (ref 12.0–15.0)
MCH: 29.4 pg (ref 26.0–34.0)
MCHC: 32.6 g/dL (ref 30.0–36.0)
MCV: 90 fL (ref 78.0–100.0)
Platelets: 177 10*3/uL (ref 150–400)
RBC: 4.32 MIL/uL (ref 3.87–5.11)
RDW: 14.6 % (ref 11.5–15.5)
WBC: 12 10*3/uL — ABNORMAL HIGH (ref 4.0–10.5)

## 2017-07-18 LAB — GLUCOSE, CAPILLARY
Glucose-Capillary: 207 mg/dL — ABNORMAL HIGH (ref 65–99)
Glucose-Capillary: 238 mg/dL — ABNORMAL HIGH (ref 65–99)
Glucose-Capillary: 237 mg/dL — ABNORMAL HIGH (ref 65–99)
Glucose-Capillary: 238 mg/dL — ABNORMAL HIGH (ref 65–99)

## 2017-07-18 LAB — PROTIME-INR
INR: 1.43
Prothrombin Time: 17.3 seconds — ABNORMAL HIGH (ref 11.4–15.2)

## 2017-07-18 MED ORDER — POTASSIUM CHLORIDE CRYS ER 20 MEQ PO TBCR: 40.0000 meq | EXTENDED_RELEASE_TABLET | Freq: Once | ORAL | Status: DC

## 2017-07-18 MED ORDER — WARFARIN SODIUM 5 MG PO TABS
5.0000 mg | ORAL_TABLET | Freq: Once | ORAL | Status: AC
  Administered 2017-07-18: 5 mg via ORAL
  Filled 2017-07-18: qty 1

## 2017-07-18 MED ORDER — GUAIFENESIN-DM 100-10 MG/5ML PO SYRP: 5.0000 mL | ORAL_SOLUTION | ORAL | Status: DC | PRN

## 2017-07-18 MED ORDER — SODIUM CHLORIDE 0.9 % IV BOLUS (SEPSIS)
500.0000 mL | Freq: Once | INTRAVENOUS | Status: AC
  Administered 2017-07-18: 500 mL via INTRAVENOUS

## 2017-07-18 MED ORDER — METHYLPREDNISOLONE SODIUM SUCC 40 MG IJ SOLR
40.0000 mg | Freq: Two times a day (BID) | INTRAMUSCULAR | Status: DC
  Administered 2017-07-18 – 2017-07-19 (×2): 40 mg via INTRAVENOUS
  Filled 2017-07-18 (×2): qty 1

## 2017-07-18 MED ORDER — HYOSCYAMINE SULFATE 0.125 MG SL SUBL
0.2500 mg | SUBLINGUAL_TABLET | Freq: Once | SUBLINGUAL | Status: AC
  Administered 2017-07-18: 0.25 mg via SUBLINGUAL
  Filled 2017-07-18: qty 2

## 2017-07-18 MED ORDER — INSULIN GLARGINE 100 UNIT/ML ~~LOC~~ SOLN
7.0000 [IU] | Freq: Every day | SUBCUTANEOUS | Status: DC
  Administered 2017-07-18: 7 [IU] via SUBCUTANEOUS
  Filled 2017-07-18 (×2): qty 0.07

## 2017-07-18 MED ORDER — ONDANSETRON HCL 4 MG/2ML IJ SOLN
4.0000 mg | Freq: Four times a day (QID) | INTRAMUSCULAR | Status: DC | PRN
  Administered 2017-07-18: 4 mg via INTRAVENOUS
  Filled 2017-07-18: qty 2

## 2017-07-18 NOTE — Care Management Note (Signed)
Case Management Note  Patient Details  Name: Connie Ruiz MRN: [id number] Date of Birth: Feb 09, 1961  Subjective/Objective:                  copd  Action/Plan: Date: July 18, 2017 Velva Harman, BSN, [place], [place] [phone] Chart and notes review for patient progress and needs. Will follow for case management and discharge needs. No cm or discharge needs present at time of this review. Next review date: [id number]  Expected Discharge Date:  07/19/17               Expected Discharge Plan:  Home/Self Care  In-House Referral:     Discharge planning Services  CM Consult  Post Acute Care Choice:    Choice offered to:     DME Arranged:    DME Agency:     HH Arranged:    HH Agency:     Status of Service:  In process, will continue to follow  If discussed at Long Length of Stay Meetings, dates discussed:    Additional Comments:  Leeroy Cha, RN 07/18/2017, 8:54 AM

## 2017-07-18 NOTE — Progress Notes (Signed)
Pt transferred at this time to 4W. RN, Robert Bellow met in room. Pt stable at time of transfer

## 2017-07-18 NOTE — Evaluation (Signed)
Physical Therapy Evaluation Patient Details Name: Connie Ruiz MRN: [id number] DOB: 11-Oct-1960 Today's Date: 07/18/2017   History of Present Illness  Pt is a 57 y.o. female admitted with acute respiratory distress, due to influenza A and pneumonia. Hx significant for CVA with R residual deficits.     Clinical Impression  Pt admitted with problem listed above. Pt currently with functional limitations due to the deficits listed below (See PT Problem List). Pt will benefit from skilled PT to increase their independence and safety with mobility to allow discharge. Pt was agreeable to ambulating but requested to only walk around room around bed. Pt reported feeling dizzy and SOB, took break to catch her break. Her BP prior to entering room was 154/100 mmHg and HR in the 90's, after ambulating her BP was 163/101 mmHg and her HR increased significantly to the 140's, RN was notified and entered room to assist. Pt reported being asymptomatic however. Recommend HHPT for additional monitoring with mobilization.      Follow Up Recommendations Home health PT    Equipment Recommendations  None recommended by PT    Recommendations for Other Services       Precautions / Restrictions Precautions Precautions: Fall Precaution Comments: monitor HR with mobilization Restrictions Weight Bearing Restrictions: No      Mobility  Bed Mobility Overal bed mobility: Needs Assistance Bed Mobility: Supine to Sit     Supine to sit: Min guard     General bed mobility comments: min/guard for safety, assist with lines/linens  Transfers Overall transfer level: Needs assistance Equipment used: 1 person hand held assist Transfers: Sit to/from Stand Sit to Stand: Min assist         General transfer comment: HHA for steadying/rising, cues for LE placement, safety  Ambulation/Gait Ambulation/Gait assistance: Min guard Ambulation Distance (Feet): 10 Feet Assistive device: 1 person hand held assist(HHA on  R, pushed IV pole with left) Gait Pattern/deviations: Step-through pattern;Decreased stride length     General Gait Details: verbal cues for safety, posture; tactile cues for steadying. Pt self-limited distance to room ambulation only. HR up to 140s bpm during ambulation (RN into room)  Financial trader [employer] (Stroke Patients Only)       Balance                                             Pertinent Vitals/Pain Pain Assessment: No/denies pain    Home Living Family/patient expects to be discharged to:: Private residence Living Arrangements: Alone Available Help at Discharge: Personal care attendant;Other (Comment);Family;Available PRN/intermittently(Aide from 9-12, every day) Type of Home: Apartment Home Access: Level entry     Home Layout: One level Home Equipment: Walker - 2 wheels;Cane - single point Additional Comments: aide comes 4 hrs 7d/wk    Prior Function Level of Independence: Needs assistance   Gait / Transfers Assistance Needed: amb with cane  ADL's / Homemaking Assistance Needed: aide assists with bathing, home care, occasionally cooking        Hand Dominance        Extremity/Trunk Assessment   Upper Extremity Assessment Upper Extremity Assessment: Generalized weakness    Lower Extremity Assessment Lower Extremity Assessment: Generalized weakness       Communication   Communication: Expressive difficulties  Cognition Arousal/Alertness: Awake/alert Behavior During  Therapy: WFL for tasks assessed/performed Overall Cognitive Status: Within Functional Limits for tasks assessed                                        General Comments      Exercises     Assessment/Plan    PT Assessment Patient needs continued PT services  PT Problem List Decreased strength;Decreased mobility;Decreased safety awareness;Decreased activity tolerance;Decreased balance;Decreased  knowledge of use of DME       PT Treatment Interventions DME instruction;Therapeutic activities;Gait training;Therapeutic exercise;Patient/family education;Functional mobility training    PT Goals (Current goals can be found in the Care Plan section)  Acute Rehab PT Goals Patient Stated Goal: to go home PT Goal Formulation: With patient Time For Goal Achievement: 07/25/17 Potential to Achieve Goals: Good    Frequency Min 2X/week   Barriers to discharge        Co-evaluation               AM-PAC PT "6 Clicks" Daily Activity  Outcome Measure Difficulty turning over in bed (including adjusting bedclothes, sheets and blankets)?: A Little Difficulty moving from lying on back to sitting on the side of the bed? : A Little Difficulty sitting down on and standing up from a chair with arms (e.g., wheelchair, bedside commode, etc,.)?: A Little Help needed moving to and from a bed to chair (including a wheelchair)?: A Little Help needed walking in hospital room?: A Little Help needed climbing 3-5 steps with a railing? : A Lot 6 Click Score: 17    End of Session   Activity Tolerance: Patient limited by fatigue Patient left: in chair;with nursing/sitter in room;with call bell/phone within reach Nurse Communication: Mobility status PT Visit Diagnosis: Other abnormalities of gait and mobility (R26.89);Muscle weakness (generalized) (M62.81)    Time: 3846-6599 PT Time Calculation (min) (ACUTE ONLY): 22 min   Charges:   PT Evaluation $PT Eval Moderate Complexity: 1 Mod     PT G Codes:       [name] Washington Whedbee, SPT  [name] Johnston Maddocks 07/18/2017, 1:09 PM

## 2017-07-18 NOTE — Progress Notes (Signed)
[place] for Warfarin Indication: Hx PE, Hx CVA  Allergies  Allergen Reactions  . Oxycodone-Acetaminophen Shortness Of Breath and Nausea Only  . Propoxyphene N-Acetaminophen Shortness Of Breath and Nausea Only  . Sulfonamide Derivatives Shortness Of Breath and Nausea Only  . Adhesive [Tape]   . Codeine Nausea Only  . Other Other (See Comments)    No blood, Jehovaeh Witness   . Gabapentin Anxiety    twitching  . Latex Rash  . Metoprolol Rash  . Morphine And Related Rash    IV site on arm is red, patient reports this is improving.  NO shortness of breath reported.  . Rosiglitazone Rash    Patient Measurements: Height: 5\' 4"  (162.6 cm) Weight: 173 lb 8 oz (78.7 kg) IBW/kg (Calculated) : 54.7  Vital Signs: Temp: 98.2 F (36.8 C) (02/11 0800) Temp Source: Oral (02/11 0800) BP: 171/85 (02/11 0800) Pulse Rate: 102 (02/11 0800)  Labs: Recent Labs    07/16/17 1240 07/16/17 1744 07/17/17 0328 07/17/17 2044 07/18/17 0643  HGB 14.2  --  12.6  --  12.7  HCT 42.7  --  36.8  --  38.9  PLT 145*  --  116*  --  177  LABPROT  --  15.0  --  15.9* 17.3*  INR  --  1.19  --  1.28 1.43  CREATININE 0.73  --  0.66  --  0.68    Estimated Creatinine Clearance: 79.7 mL/min (by C-G formula based on SCr of 0.68 mg/dL).   Medical History: Past Medical History:  Diagnosis Date  . Candida esophagitis ([place]) 11/12/2014  . CEREBROVASCULAR ACCIDENT, ACUTE 04/15/2010  . CLOSTRIDIUM DIFFICILE COLITIS, HX OF 08/21/2007  . CONGESTIVE HEART FAILURE 08/21/2007  . Current use of long term anticoagulation    Dr. Andree Elk, [facility]  . CVA 04/17/2010  . Depression    Dr. Andree Elk, [place], TYPE II 08/21/2007  . DVT, HX OF 08/21/2007  . GERD 08/21/2007  . GOUT 08/21/2007  . History of stroke with residual effects   . HYPERLIPIDEMIA 08/21/2007  . HYPERTENSION 08/21/2007   Dr. Andree Elk, [place], HX OF 08/22/2007   s/p renal  transplant-Dr. Andree Elk, [place] 08/21/2007  . OSTEOPOROSIS 08/21/2007   Rheumatol at [facility]  . Pulmonary embolism ([place]) 07/16/2010  . Renal failure   . RENAL INSUFFICIENCY 08/21/2007  . Right sided weakness   . Steroid-induced hyperglycemia 11/09/2014  . Tachycardia   . THYROID NODULE, LEFT 04/10/2009    Medications:  Scheduled:  . atorvastatin  40 mg Oral Daily  . calcitRIOL  0.25 mcg Oral Daily  . diltiazem  240 mg Oral Daily  . donepezil  10 mg Oral QHS  . insulin aspart  0-5 Units Subcutaneous QHS  . insulin aspart  0-9 Units Subcutaneous TID WC  . ipratropium-albuterol  3 mL Nebulization TID  . levothyroxine  125 mcg Oral QAC breakfast  . losartan  100 mg Oral Daily  . methylPREDNISolone (SOLU-MEDROL) injection  40 mg Intravenous Q8H  . mirtazapine  30 mg Oral QHS  . mycophenolate  250 mg Oral QHS  . mycophenolate  500 mg Oral Daily  . oseltamivir  75 mg Oral BID  . pantoprazole  40 mg Oral Daily  . potassium chloride  40 mEq Oral Once  . potassium chloride  40 mEq Oral Once  . QUEtiapine  25 mg Oral QHS  . sertraline  50 mg Oral Daily  .  tacrolimus  1 mg Oral BID  . Warfarin - Pharmacist Dosing Inpatient   Does not apply q1800   Infusions:  . sodium chloride 100 mL/hr at 07/18/17 0600  . piperacillin-tazobactam (ZOSYN)  IV 3.375 g (07/18/17 0640)  . sodium chloride      Assessment: 46 yoF admitted on 2/9 with SOB, cough, concerning for pneumonia.  PMH is significant for PE and CVA on chronic warfarin anticoagulation.  Pharmacy is consulted to continue inpatient dosing.  PTA Warfarin reported as 5mg  daily except 2.5 mg on Wed/Sat.  Admission INR 1.19 Last [facility] office anticoag visit on 07/08/17 showed INR 3.1 with recommended change to warfarin 5mg  daily except 2.5 mg on M/W/F.  Today, 07/18/2017:  INR subtherapeutic at 1.43  CBC: Hgb stable, Plt improved to 177k  No major drug-drug interactions noted, broad spectrum antibiotics may lead to prolonged  INR.   Goal of Therapy:  INR 2-3 Monitor platelets by anticoagulation protocol: Yes   Plan:  Warfarin 5 mg PO x 1 today Daily PT/INR. Monitor for signs and symptoms of bleeding. Consider bridging with Lovenox until INR is therapeutic  Peggyann Juba, PharmD, BCPS Pager: [phone] 07/18/2017, 9:23 AM

## 2017-07-18 NOTE — Progress Notes (Signed)
PROGRESS NOTE    Connie Ruiz  UMP:[id number] DOB: 07/15/1960 DOA: 07/16/2017 PCP: Renato Shin, MD   Brief Narrative:   57 year old female with a history of CVA on Coumadin, hokum, hypothyroidism, renal transplant, [name], GERD, essential hypertension came to the hospital with complains of cough and shortness of breath.  She was found to have a right lower lobe pneumonia and was flu positive.  She was started on Rocephin and azithromycin along with Tamiflu.  Antibiotics were changed from Rocephin and and azithromycin to vancomycin and Zosyn given her bacteremia compromised state.  Her MRSA swab was negative therefore vancomycin was discontinued.    Assessment & Plan:   Active Problems:   CAP (community acquired pneumonia)   Acute respiratory distress likely secondary to influenza and right lower lobe community-acquired pneumonia - MRSA swab was negative therefore vancomycin. -Cultures remain negative, will continue Zosyn again today -Continue nebulizer treatments.  Reduce Solu-Medrol 40 mg to every 12 hours -Continue Tamiflu -Robitussin as needed. -UA is negative for urinary tract infection  Sinus tachycardia -Resolved.  Elevated total bilirubin -Was 1.4 yesterday, today it is trended down to 1.1.  No further workup  Generalized weakness -Consult PT/OT  History of systemic lupus ESRD on hemodialysis for 5 years status post renal transplant in 2005 -Stable.  Continue to monitor urine output -Continue her CellCept and Prograf.  Holding off on prednisone as patient is currently on Solu-Medrol IV.  During this time will continue Accu-Cheks and sliding scale  Diabetes mellitus type 2 -Hold metformin, Accu-Cheks and sliding scale -Due to slightly elevated sugars while on steroids and metformin on hold low-dose Lantus daily.  History of CVA with residual expressive aphasia -Coumadin, pharmacy to dose.  Essential hypertension -Resume home medications Cozaar 100 mils  240 mg orally daily  Hypothyroidism 25 mcg orally daily  GERD -Nexium   Patient is a [name].   DVT prophylaxis: Continue Coumadin Code Status: Full code Family Communication: None at bedside Disposition Plan: Transfer patient to medical floor  Consultants:   none  Procedures:   None  Antimicrobials:   Azithromycin 2/9 1 dose  Rocephin 2/9 1 dose  vancomycin 2/9 > 2/10  Zosyn 2/9>   Subjective: States she feels a better this morning but continues to have overall weakness.  Objective: Vitals:   07/18/17 0400 07/18/17 0500 07/18/17 0600 07/18/17 0800  BP: 129/81  122/90 (!) 171/85  Pulse: 65  (!) 58 (!) 102  Resp: 10  13 15   Temp:    98.2 F (36.8 C)  TempSrc:    Oral  SpO2: 98%  99% 98%  Weight:  78.7 kg (173 lb 8 oz)    Height:        Intake/Output Summary (Last 24 hours) at 07/18/2017 1108 Last data filed at 07/18/2017 0900 Gross per 24 hour  Intake 3026 ml  Output 300 ml  Net 2726 ml   Filed Weights   07/17/17 0600 07/18/17 0500  Weight: 78.7 kg (173 lb 8 oz) 78.7 kg (173 lb 8 oz)    Examination:  General exam: No acute distress, calm and comfortable Respiratory system: Clear to auscultation bilaterally with only minimal crackles heard at the bases. Cardiovascular system: S1 & S2 heard, RRR. No JVD, murmurs, rubs, gallops or clicks. No pedal edema. Gastrointestinal system: Abdomen is nondistended, soft and nontender. No organomegaly or masses felt. Normal bowel sounds heard. Central nervous system: Alert and oriented.  Has some baseline expressive aphasia otherwise no other focal neurologic  defects Extremities: Symmetric 5 x 5 power. Skin: No rashes, lesions or ulcers Psychiatry: Judgement and insight appear normal. Mood & affect appropriate.     Data Reviewed:   CBC: Recent Labs  Lab 07/16/17 1240 07/17/17 0328 07/18/17 0643  WBC 8.0 4.2 12.0*  NEUTROABS 4.6  --   --   HGB 14.2 12.6 12.7  HCT 42.7 36.8 38.9  MCV 89.9  88.9 90.0  PLT 145* 116* 323   Basic Metabolic Panel: Recent Labs  Lab 07/16/17 1240 07/17/17 0328 07/18/17 0643  NA 134* 136 142  K 3.4* 3.6 3.3*  CL 103 110 111  CO2 20* 19* 21*  GLUCOSE 131* 258* 212*  BUN 14 12 13   CREATININE 0.73 0.66 0.68  CALCIUM 8.4* 7.8* 8.6*   GFR: Estimated Creatinine Clearance: 79.7 mL/min (by C-G formula based on SCr of 0.68 mg/dL). Liver Function Tests: Recent Labs  Lab 07/16/17 1240 07/17/17 0328 07/18/17 0643  AST 25 24 18   ALT 15 15 15   ALKPHOS 70 55 56  BILITOT 1.4* 1.1 0.8  PROT 6.9 5.8* 6.4*  ALBUMIN 3.3* 2.6* 3.1*   No results for input(s): LIPASE, AMYLASE in the last 168 hours. No results for input(s): AMMONIA in the last 168 hours. Coagulation Profile: Recent Labs  Lab 07/16/17 1744 07/17/17 2044 07/18/17 0643  INR 1.19 1.28 1.43   Cardiac Enzymes: No results for input(s): CKTOTAL, CKMB, CKMBINDEX, TROPONINI in the last 168 hours. BNP (last 3 results) No results for input(s): PROBNP in the last 8760 hours. HbA1C: No results for input(s): HGBA1C in the last 72 hours. CBG: Recent Labs  Lab 07/17/17 0753 07/17/17 1212 07/17/17 1610 07/17/17 2206 07/18/17 0736  GLUCAP 230* 280* 220* 203* 207*   Lipid Profile: No results for input(s): CHOL, HDL, LDLCALC, TRIG, CHOLHDL, LDLDIRECT in the last 72 hours. Thyroid Function Tests: No results for input(s): TSH, T4TOTAL, FREET4, T3FREE, THYROIDAB in the last 72 hours. Anemia Panel: No results for input(s): VITAMINB12, FOLATE, FERRITIN, TIBC, IRON, RETICCTPCT in the last 72 hours. Sepsis Labs: Recent Labs  Lab 07/16/17 1247 07/16/17 1750  LATICACIDVEN 1.56 0.82    Recent Results (from the past 240 hour(s))  Blood culture (routine x 2)     Status: None (Preliminary result)   Collection Time: 07/16/17  3:40 PM  Result Value Ref Range Status   Specimen Description   Final    BLOOD RIGHT WRIST Performed at [facility] [address],  [place], [place] [address]    Special Requests   Final    BOTTLES DRAWN AEROBIC AND ANAEROBIC Blood Culture adequate volume Performed at [facility] [address] [place], [place] [address]    Culture   Final    NO GROWTH 1 DAY Performed at [facility], [place] [address] [place], [place] [address]    Report Status PENDING  Incomplete  MRSA PCR Screening     Status: None   Collection Time: 07/16/17 11:23 PM  Result Value Ref Range Status   MRSA by PCR NEGATIVE NEGATIVE Final    Comment:        The GeneXpert MRSA Assay (FDA approved for NASAL specimens only), is one component of a comprehensive MRSA colonization surveillance program. It is not intended to diagnose MRSA infection nor to guide or monitor treatment for MRSA infections. Performed at [facility], [place] [address], [place], [place] [address]          Radiology Studies: Dg  Chest 2 View  Result Date: 07/16/2017 CLINICAL DATA:  57 year old female with cough, congestion, fever and body aches for the past 4 days EXAM: CHEST  2 VIEW COMPARISON:  Prior chest x-ray 09/23/2016 FINDINGS: New patchy airspace opacity in the right lower lobe concerning for bronchopneumonia given the clinical history. There is associated elevation of the right hemidiaphragm. Stable mild cardiomegaly. Background bronchitic changes and interstitial prominence are similar compared to prior. No acute osseous abnormality. IMPRESSION: 1. Right lower lobe pneumonia. Followup PA and lateral chest X-ray is recommended in 3-4 weeks following trial of antibiotic therapy to ensure resolution and exclude underlying malignancy. 2. Elevation of the right hemidiaphragm likely secondary to right lower lobe volume loss. Electronically Signed   By: Jacqulynn Cadet M.D.   On: 07/16/2017 13:31        Scheduled Meds: . atorvastatin  40 mg Oral Daily  . calcitRIOL  0.25 mcg Oral Daily  . diltiazem  240 mg Oral Daily  .  donepezil  10 mg Oral QHS  . insulin aspart  0-5 Units Subcutaneous QHS  . insulin aspart  0-9 Units Subcutaneous TID WC  . ipratropium-albuterol  3 mL Nebulization TID  . levothyroxine  125 mcg Oral QAC breakfast  . losartan  100 mg Oral Daily  . methylPREDNISolone (SOLU-MEDROL) injection  40 mg Intravenous Q8H  . mirtazapine  30 mg Oral QHS  . mycophenolate  250 mg Oral QHS  . mycophenolate  500 mg Oral Daily  . oseltamivir  75 mg Oral BID  . pantoprazole  40 mg Oral Daily  . potassium chloride  40 mEq Oral Once  . potassium chloride  40 mEq Oral Once  . QUEtiapine  25 mg Oral QHS  . sertraline  50 mg Oral Daily  . tacrolimus  1 mg Oral BID  . warfarin  5 mg Oral ONCE-1800  . Warfarin - Pharmacist Dosing Inpatient   Does not apply q1800   Continuous Infusions: . sodium chloride 100 mL/hr at 07/18/17 0600  . piperacillin-tazobactam (ZOSYN)  IV Stopped (07/18/17 0946)  . sodium chloride 500 mL (07/18/17 0945)     LOS: 2 days    Time spent: 32 mins    Nobie Alleyne Arsenio Loader, MD [facility] Pager [phone]   If 7PM-7AM, please contact night-coverage [email or web address] Password [email or web address] 07/18/2017, 11:08 AM

## 2017-07-19 ENCOUNTER — Inpatient Hospital Stay (HOSPITAL_COMMUNITY): Payer: Medicare Other

## 2017-07-19 DIAGNOSIS — R140 Abdominal distension (gaseous): Secondary | ICD-10-CM

## 2017-07-19 DIAGNOSIS — E876 Hypokalemia: Secondary | ICD-10-CM

## 2017-07-19 LAB — COMPREHENSIVE METABOLIC PANEL
ALT: 13 U/L — ABNORMAL LOW (ref 14–54)
AST: 17 U/L (ref 15–41)
Albumin: 2.7 g/dL — ABNORMAL LOW (ref 3.5–5.0)
Alkaline Phosphatase: 46 U/L (ref 38–126)
Anion gap: 10 (ref 5–15)
BUN: 19 mg/dL (ref 6–20)
CO2: 21 mmol/L — ABNORMAL LOW (ref 22–32)
Calcium: 8.5 mg/dL — ABNORMAL LOW (ref 8.9–10.3)
Chloride: 110 mmol/L (ref 101–111)
Creatinine, Ser: 0.64 mg/dL (ref 0.44–1.00)
GFR calc Af Amer: >60 mL/min (ref >60)
GFR calc non Af Amer: >60 mL/min (ref >60)
Glucose, Bld: 288 mg/dL — ABNORMAL HIGH (ref 65–99)
Potassium: 2.6 mmol/L — CL (ref 3.5–5.1)
Sodium: 141 mmol/L (ref 135–145)
Total Bilirubin: 0.7 mg/dL (ref 0.3–1.2)
Total Protein: 5.5 g/dL — ABNORMAL LOW (ref 6.5–8.1)

## 2017-07-19 LAB — PROTIME-INR
INR: 1.71
Prothrombin Time: 19.9 seconds — ABNORMAL HIGH (ref 11.4–15.2)

## 2017-07-19 LAB — LIPASE, BLOOD: Lipase: 60 U/L — ABNORMAL HIGH (ref 11–51)

## 2017-07-19 LAB — CBC
HCT: 36.9 % (ref 36.0–46.0)
Hemoglobin: 12.3 g/dL (ref 12.0–15.0)
MCH: 29.8 pg (ref 26.0–34.0)
MCHC: 33.3 g/dL (ref 30.0–36.0)
MCV: 89.3 fL (ref 78.0–100.0)
Platelets: 184 10*3/uL (ref 150–400)
RBC: 4.13 MIL/uL (ref 3.87–5.11)
RDW: 14.3 % (ref 11.5–15.5)
WBC: 10 10*3/uL (ref 4.0–10.5)

## 2017-07-19 LAB — GLUCOSE, CAPILLARY
Glucose-Capillary: 206 mg/dL — ABNORMAL HIGH (ref 65–99)
Glucose-Capillary: 152 mg/dL — ABNORMAL HIGH (ref 65–99)
Glucose-Capillary: 177 mg/dL — ABNORMAL HIGH (ref 65–99)
Glucose-Capillary: 171 mg/dL — ABNORMAL HIGH (ref 65–99)

## 2017-07-19 LAB — MAGNESIUM: Magnesium: 1.4 mg/dL — ABNORMAL LOW (ref 1.7–2.4)

## 2017-07-19 MED ORDER — INSULIN GLARGINE 100 UNIT/ML ~~LOC~~ SOLN
12.0000 [IU] | Freq: Every day | SUBCUTANEOUS | Status: DC
  Administered 2017-07-19: 12 [IU] via SUBCUTANEOUS
  Filled 2017-07-19 (×2): qty 0.12

## 2017-07-19 MED ORDER — PANTOPRAZOLE SODIUM 40 MG PO TBEC
40.0000 mg | DELAYED_RELEASE_TABLET | Freq: Two times a day (BID) | ORAL | Status: DC
  Administered 2017-07-19 – 2017-07-20 (×2): 40 mg via ORAL
  Filled 2017-07-19 (×2): qty 1

## 2017-07-19 MED ORDER — POTASSIUM CHLORIDE 10 MEQ/100ML IV SOLN: 10.0000 meq | INTRAVENOUS | Status: DC

## 2017-07-19 MED ORDER — POTASSIUM CHLORIDE CRYS ER 20 MEQ PO TBCR
40.0000 meq | EXTENDED_RELEASE_TABLET | Freq: Once | ORAL | Status: DC
  Filled 2017-07-19 (×2): qty 2

## 2017-07-19 MED ORDER — ENOXAPARIN SODIUM 80 MG/0.8ML ~~LOC~~ SOLN
1.0000 mg/kg | Freq: Two times a day (BID) | SUBCUTANEOUS | Status: AC
  Administered 2017-07-19 – 2017-07-20 (×3): 80 mg via SUBCUTANEOUS
  Filled 2017-07-19: qty 0.8

## 2017-07-19 MED ORDER — FAMOTIDINE 20 MG PO TABS
20.0000 mg | ORAL_TABLET | Freq: Two times a day (BID) | ORAL | Status: DC | PRN
  Administered 2017-07-19: 20 mg via ORAL
  Filled 2017-07-19: qty 1

## 2017-07-19 MED ORDER — POTASSIUM CHLORIDE 10 MEQ/100ML IV SOLN
10.0000 meq | INTRAVENOUS | Status: AC
  Administered 2017-07-19 (×3): 10 meq via INTRAVENOUS
  Filled 2017-07-19 (×3): qty 100

## 2017-07-19 MED ORDER — POTASSIUM CHLORIDE CRYS ER 20 MEQ PO TBCR
40.0000 meq | EXTENDED_RELEASE_TABLET | Freq: Once | ORAL | Status: AC
  Administered 2017-07-19: 40 meq via ORAL
  Filled 2017-07-19: qty 2

## 2017-07-19 MED ORDER — PREDNISONE 50 MG PO TABS
50.0000 mg | ORAL_TABLET | Freq: Every day | ORAL | Status: DC
  Administered 2017-07-20: 50 mg via ORAL
  Filled 2017-07-19: qty 1

## 2017-07-19 MED ORDER — MAGNESIUM SULFATE 2 GM/50ML IV SOLN
2.0000 g | Freq: Once | INTRAVENOUS | Status: AC
  Administered 2017-07-19: 2 g via INTRAVENOUS
  Filled 2017-07-19: qty 50

## 2017-07-19 MED ORDER — SIMETHICONE 80 MG PO CHEW: 80.0000 mg | CHEWABLE_TABLET | Freq: Four times a day (QID) | ORAL | Status: DC | PRN

## 2017-07-19 NOTE — Progress Notes (Signed)
[facility] for Warfarin Indication: Hx PE, Hx CVA  Allergies  Allergen Reactions  . Oxycodone-Acetaminophen Shortness Of Breath and Nausea Only  . Propoxyphene N-Acetaminophen Shortness Of Breath and Nausea Only  . Sulfonamide Derivatives Shortness Of Breath and Nausea Only  . Adhesive [Tape]   . Codeine Nausea Only  . Other Other (See Comments)    No blood, Jehovaeh Witness   . Gabapentin Anxiety    twitching  . Latex Rash  . Metoprolol Rash  . Morphine And Related Rash    IV site on arm is red, patient reports this is improving.  NO shortness of breath reported.  . Rosiglitazone Rash    Patient Measurements: Height: 5\' 4"  (162.6 cm) Weight: 180 lb 5.4 oz (81.8 kg) IBW/kg (Calculated) : 54.7  Vital Signs: Temp: 98.1 F (36.7 C) (02/12 1242) Temp Source: Oral (02/12 1242) BP: 157/89 (02/12 1242) Pulse Rate: 70 (02/12 1242)  Labs: Recent Labs    07/17/17 0328 07/17/17 2044 07/18/17 0643 07/19/17 0417  HGB 12.6  --  12.7 12.3  HCT 36.8  --  38.9 36.9  PLT 116*  --  177 184  LABPROT  --  15.9* 17.3* 19.9*  INR  --  1.28 1.43 1.71  CREATININE 0.66  --  0.68 0.64    Estimated Creatinine Clearance: 81.2 mL/min (by C-G formula based on SCr of 0.64 mg/dL).  Medications:  Scheduled:  . atorvastatin  40 mg Oral Daily  . calcitRIOL  0.25 mcg Oral Daily  . diltiazem  240 mg Oral Daily  . donepezil  10 mg Oral QHS  . insulin aspart  0-5 Units Subcutaneous QHS  . insulin aspart  0-9 Units Subcutaneous TID WC  . insulin glargine  12 Units Subcutaneous QHS  . levothyroxine  125 mcg Oral QAC breakfast  . losartan  100 mg Oral Daily  . mirtazapine  30 mg Oral QHS  . mycophenolate  250 mg Oral QHS  . mycophenolate  500 mg Oral Daily  . oseltamivir  75 mg Oral BID  . pantoprazole  40 mg Oral BID AC  . potassium chloride  40 mEq Oral Once  . potassium chloride  40 mEq Oral Once  . potassium chloride  40 mEq Oral Once  . [START ON  07/20/2017] predniSONE  50 mg Oral Q breakfast  . QUEtiapine  25 mg Oral QHS  . sertraline  50 mg Oral Daily  . tacrolimus  1 mg Oral BID  . Warfarin - Pharmacist Dosing Inpatient   Does not apply q1800   Infusions:  . sodium chloride 100 mL/hr at 07/19/17 0532  . piperacillin-tazobactam (ZOSYN)  IV Stopped (07/19/17 1021)    Assessment: 71 yoF admitted on 2/9 with SOB, cough, concerning for pneumonia.  PMH is significant for PE and CVA on chronic warfarin anticoagulation.  Pharmacy is consulted to continue inpatient dosing.  PTA Warfarin reported as 5mg  daily except 2.5 mg on Wed/Sat.  Admission INR 1.19 Last [facility] office anticoag visit on 07/08/17 showed INR 3.1 with recommended change to warfarin 5mg  daily except 2.5 mg on M/W/F.  Today, 07/19/2017:  INR subtherapeutic but improved today  CBC: Hgb stable, Plt increased back to WNL  No major drug-drug interactions noted, broad spectrum antibiotics may lead to prolonged INR  Goal of Therapy:  INR 2-3 Monitor platelets by anticoagulation protocol: Yes   Plan:  Warfarin 4 mg PO x 1 today Daily PT/INR. Monitor for signs and symptoms  of bleeding. Consider bridging with Lovenox until INR is therapeutic  Reuel Boom, PharmD, BCPS [phone] 07/19/2017, 12:44 PM

## 2017-07-19 NOTE — Care Management Important Message (Addendum)
Important Message  Patient Details Important Message  Patient Details IM Letter given to Cookie/Case Manager to present to the Patient Name: Connie Ruiz MRN: [id number] Date of Birth: [birth year]   Medicare Important Message Given:  Yes    Kerin Salen 07/19/2017, 11:49 AM Name: Connie Ruiz MRN: [id number] Date of Birth: [birth year]   Medicare Important Message Given:  Yes    Kerin Salen 07/19/2017, 11:49 AMImportant Message  Patient Details  Name: Connie Ruiz MRN: [id number] Date of Birth: 1961-04-23   Medicare Important Message Given:  Yes    Kerin Salen 07/19/2017, 11:48 AM

## 2017-07-19 NOTE — Progress Notes (Signed)
Pt refuses orthostatic vitals to be taken at this time. When asked why she refuses, pt states she is in "too much pain". Will pass onto day shift RN pt refused.

## 2017-07-19 NOTE — Progress Notes (Signed)
Medications administered by student RN [id number] with supervision of Clinical Instructor Tukker Byrns MSN, RN-BC or patient's assigned RN.   

## 2017-07-19 NOTE — Progress Notes (Signed)
Pharmacy Antibiotic Note  Connie Ruiz is a 57 y.o. female with PMH renal transplant admitted on 07/16/2017 with pneumonia.  Pharmacy has been consulted for Vancomycin and Zosyn dosing.  Today, 07/19/2017:  D4 abx; D3 tamiflu  WBC/temps wnl  Cultures remain clear  SCr stable wnl, at baseline  Plan:  Continue Zosyn 3.375g IV Q8H infused over 4hrs.  Oseltamivir 75 mg PO BID x5 days per MD for positive influenza  Pharmacy will sign off but follow peripherally for cultures and renal adjustments  Height: 5\' 4"  (162.6 cm) Weight: 180 lb 5.4 oz (81.8 kg) IBW/kg (Calculated) : 54.7  Last documented Ht 1.619m, Wt 78.7 kg   Temp (24hrs), Avg:98.2 F (36.8 C), Min:97.7 F (36.5 C), Max:98.6 F (37 C)  Recent Labs  Lab 07/16/17 1240 07/16/17 1247 07/16/17 1750 07/17/17 0328 07/18/17 0643 07/19/17 0417  WBC 8.0  --   --  4.2 12.0* 10.0  CREATININE 0.73  --   --  0.66 0.68 0.64  LATICACIDVEN  --  1.56 0.82  --   --   --     Estimated Creatinine Clearance: 81.2 mL/min (by C-G formula based on SCr of 0.64 mg/dL).    Allergies  Allergen Reactions  . Oxycodone-Acetaminophen Shortness Of Breath and Nausea Only  . Propoxyphene N-Acetaminophen Shortness Of Breath and Nausea Only  . Sulfonamide Derivatives Shortness Of Breath and Nausea Only  . Adhesive [Tape]   . Codeine Nausea Only  . Other Other (See Comments)    No blood, Jehovaeh Witness   . Gabapentin Anxiety    twitching  . Latex Rash  . Metoprolol Rash  . Morphine And Related Rash    IV site on arm is red, patient reports this is improving.  NO shortness of breath reported.  . Rosiglitazone Rash     Thank you for allowing pharmacy to be a part of this patient's care.  Reuel Boom, PharmD, BCPS [phone] 07/19/2017, 12:58 PM

## 2017-07-19 NOTE — Progress Notes (Signed)
PROGRESS NOTE    Connie Ruiz  DTO:[id number] DOB: 02-22-1961 DOA: 07/16/2017 PCP: Renato Shin, MD   Brief Narrative:   57 year old female with a history of CVA on Coumadin, hokum, hypothyroidism, renal transplant, [name], GERD, essential hypertension came to the hospital with complains of cough and shortness of breath.  She was found to have a right lower lobe pneumonia and was flu positive.  She was started on Rocephin and azithromycin along with Tamiflu.  Antibiotics were changed from Rocephin and and azithromycin to vancomycin and Zosyn given her bacteremia compromised state.  Her MRSA swab was negative therefore vancomycin was discontinued.    Assessment & Plan:   Active Problems:   CAP (community acquired pneumonia)   Acute respiratory distress likely secondary to influenza and right lower lobe community-acquired pneumonia - MRSA swab was negative therefore vancomycin. -Cultures remain negative, will continue Zosyn  -Continue nebulizer treatments.  Transition her to PO prednisone toda will do it for 3 more days and then place her back on her immunosuppression dose.  -Continue Tamiflu -Robitussin as needed. -UA is negative for urinary tract infection  Abdominal Bloating and epigastric burning -Abdominal x-rays negative for any obstruction -We will change her Protonix daily to twice daily before meals. Will also add SImethacone.  -LFTs are normal, check lipase -If this does not help we will get CT abdomen pelvis  Hypokalemia/hypomagnesemia -Repletion has been ordered  Sinus tachycardia -Resolved.  Elevated total bilirubin -Resolved, trended down to 0.7  Generalized weakness -Physical therapy recommends home PT  History of systemic lupus ESRD on hemodialysis for 5 years status post renal transplant in 2005 -Stable.  Continue to monitor urine output -Continue her CellCept and Prograf.  Currently on oral prednisone.  During this time will continue Accu-Cheks  and sliding scale  Diabetes mellitus type 2 -Hold metformin, Accu-Cheks and sliding scale -Due to slightly elevated sugars while on steroids and metformin on hold.  Lantus low-dose added, will increase from 7 units to 11 units today  History of CVA with residual expressive aphasia -Coumadin, pharmacy to dose.  Essential hypertension -Resume home medications Cozaar 100 milligrams, Cardizem 240 mg orally daily  Hypothyroidism -Levothyroxine 125 mcg orally daily  GERD -Nexium   Patient is a [name].   DVT prophylaxis: Continue Coumadin Code Status: Full code Family Communication: None at bedside Disposition Plan: Maintain inpatient stay for another 24 hours at least.  Consultants:   none  Procedures:   None  Antimicrobials:   Azithromycin 2/9 1 dose  Rocephin 2/9 1 dose  vancomycin 2/9 > 2/10  Zosyn 2/9>   Subjective: Reports of abdominal bloating/epigastric discomfort feeling of nausea.  No other acute events overnight.  States her breathing has improved.  Due to this her diet is poor  Objective: Vitals:   07/18/17 2002 07/19/17 0500 07/19/17 0530 07/19/17 0638  BP: (!) 176/100  (!) 148/101   Pulse: 86  (!) 102 96  Resp: 14  15   Temp: 97.7 F (36.5 C)  98.5 F (36.9 C)   TempSrc: Oral  Oral   SpO2: 93%  100%   Weight:  81.8 kg (180 lb 5.4 oz)    Height:        Intake/Output Summary (Last 24 hours) at 07/19/2017 1101 Last data filed at 07/19/2017 1020 Gross per 24 hour  Intake 3373.33 ml  Output -  Net 3373.33 ml   Filed Weights   07/17/17 0600 07/18/17 0500 07/19/17 0500  Weight: 78.7 kg (173 lb  8 oz) 78.7 kg (173 lb 8 oz) 81.8 kg (180 lb 5.4 oz)    Examination:  General exam: No acute distress, calm and comfortable Respiratory system: CTAB/L Cardiovascular system: S1 & S2 heard, RRR. No JVD, murmurs, rubs, gallops or clicks. No pedal edema. Gastrointestinal system: Abdomen is slightly distended, soft and nontender. No  organomegaly or masses felt. Normal bowel sounds heard. Central nervous system: Alert and oriented.  Has some baseline expressive aphasia otherwise no other focal neurologic defects Extremities: Symmetric 5 x 5 power. Skin: No rashes, lesions or ulcers Psychiatry: Judgement and insight appear normal. Mood & affect appropriate.     Data Reviewed:   CBC: Recent Labs  Lab 07/16/17 1240 07/17/17 0328 07/18/17 0643 07/19/17 0417  WBC 8.0 4.2 12.0* 10.0  NEUTROABS 4.6  --   --   --   HGB 14.2 12.6 12.7 12.3  HCT 42.7 36.8 38.9 36.9  MCV 89.9 88.9 90.0 89.3  PLT 145* 116* 177 283   Basic Metabolic Panel: Recent Labs  Lab 07/16/17 1240 07/17/17 0328 07/18/17 0643 07/19/17 0417  NA 134* 136 142 141  K 3.4* 3.6 3.3* 2.6*  CL 103 110 111 110  CO2 20* 19* 21* 21*  GLUCOSE 131* 258* 212* 288*  BUN 14 12 13 19   CREATININE 0.73 0.66 0.68 0.64  CALCIUM 8.4* 7.8* 8.6* 8.5*  MG  --   --   --  1.4*   GFR: Estimated Creatinine Clearance: 81.2 mL/min (by C-G formula based on SCr of 0.64 mg/dL). Liver Function Tests: Recent Labs  Lab 07/16/17 1240 07/17/17 0328 07/18/17 0643 07/19/17 0417  AST 25 24 18 17   ALT 15 15 15  13*  ALKPHOS 70 55 56 46  BILITOT 1.4* 1.1 0.8 0.7  PROT 6.9 5.8* 6.4* 5.5*  ALBUMIN 3.3* 2.6* 3.1* 2.7*   No results for input(s): LIPASE, AMYLASE in the last 168 hours. No results for input(s): AMMONIA in the last 168 hours. Coagulation Profile: Recent Labs  Lab 07/16/17 1744 07/17/17 2044 07/18/17 0643 07/19/17 0417  INR 1.19 1.28 1.43 1.71   Cardiac Enzymes: No results for input(s): CKTOTAL, CKMB, CKMBINDEX, TROPONINI in the last 168 hours. BNP (last 3 results) No results for input(s): PROBNP in the last 8760 hours. HbA1C: No results for input(s): HGBA1C in the last 72 hours. CBG: Recent Labs  Lab 07/18/17 0736 07/18/17 1244 07/18/17 1732 07/18/17 2103 07/19/17 0734  GLUCAP 207* 238* 237* 238* 206*   Lipid Profile: No results for  input(s): CHOL, HDL, LDLCALC, TRIG, CHOLHDL, LDLDIRECT in the last 72 hours. Thyroid Function Tests: No results for input(s): TSH, T4TOTAL, FREET4, T3FREE, THYROIDAB in the last 72 hours. Anemia Panel: No results for input(s): VITAMINB12, FOLATE, FERRITIN, TIBC, IRON, RETICCTPCT in the last 72 hours. Sepsis Labs: Recent Labs  Lab 07/16/17 1247 07/16/17 1750  LATICACIDVEN 1.56 0.82    Recent Results (from the past 240 hour(s))  Blood culture (routine x 2)     Status: None (Preliminary result)   Collection Time: 07/16/17  3:40 PM  Result Value Ref Range Status   Specimen Description   Final    BLOOD RIGHT WRIST Performed at [facility] [address], [place], [place] [address]    Special Requests   Final    BOTTLES DRAWN AEROBIC AND ANAEROBIC Blood Culture adequate volume Performed at [facility] [address] [place], [place] [address]    Culture   Final    NO GROWTH 2 DAYS  Performed at [facility], [place] [address] [place], [place] [address]    Report Status PENDING  Incomplete  MRSA PCR Screening     Status: None   Collection Time: 07/16/17 11:23 PM  Result Value Ref Range Status   MRSA by PCR NEGATIVE NEGATIVE Final    Comment:        The GeneXpert MRSA Assay (FDA approved for NASAL specimens only), is one component of a comprehensive MRSA colonization surveillance program. It is not intended to diagnose MRSA infection nor to guide or monitor treatment for MRSA infections. Performed at [facility], [place] [address] [place], [place] [address]          Radiology Studies: Dg Abd 1 View  Result Date: 07/19/2017 CLINICAL DATA:  Abdominal pain and distension EXAM: ABDOMEN - 1 VIEW COMPARISON:  CT abdomen and pelvis June 15, 2015 FINDINGS: There is no appreciable bowel dilatation or air-fluid level to suggest bowel obstruction. No free air. There are postoperative changes on the right. No evident bone  lesions. IMPRESSION: No evident bowel obstruction or free air. Electronically Signed   By: Lowella Grip III M.D.   On: 07/19/2017 10:39        Scheduled Meds: . atorvastatin  40 mg Oral Daily  . calcitRIOL  0.25 mcg Oral Daily  . diltiazem  240 mg Oral Daily  . donepezil  10 mg Oral QHS  . insulin aspart  0-5 Units Subcutaneous QHS  . insulin aspart  0-9 Units Subcutaneous TID WC  . insulin glargine  7 Units Subcutaneous QHS  . levothyroxine  125 mcg Oral QAC breakfast  . losartan  100 mg Oral Daily  . methylPREDNISolone (SOLU-MEDROL) injection  40 mg Intravenous Q12H  . mirtazapine  30 mg Oral QHS  . mycophenolate  250 mg Oral QHS  . mycophenolate  500 mg Oral Daily  . oseltamivir  75 mg Oral BID  . pantoprazole  40 mg Oral BID AC  . potassium chloride  40 mEq Oral Once  . potassium chloride  40 mEq Oral Once  . potassium chloride  40 mEq Oral Once  . QUEtiapine  25 mg Oral QHS  . sertraline  50 mg Oral Daily  . tacrolimus  1 mg Oral BID  . Warfarin - Pharmacist Dosing Inpatient   Does not apply q1800   Continuous Infusions: . sodium chloride 100 mL/hr at 07/19/17 0532  . magnesium sulfate 1 - 4 g bolus IVPB 2 g (07/19/17 1020)  . piperacillin-tazobactam (ZOSYN)  IV Stopped (07/19/17 1021)     LOS: 3 days    Time spent: 32 mins    Sophie Quiles Arsenio Loader, MD [facility] Pager [phone]   If 7PM-7AM, please contact night-coverage [email or web address] Password [email or web address] 07/19/2017, 11:01 AM

## 2017-07-19 NOTE — Evaluation (Signed)
Occupational Therapy Evaluation Patient Details Name: Connie Ruiz MRN: [id number] DOB: 1960-10-15 Today's Date: 07/19/2017    History of Present Illness Pt is a 57 y.o. female admitted with acute respiratory distress, due to influenza A and pneumonia. Hx significant for CVA with R residual deficits.    Clinical Impression   Pt admitted with acute respiratory distress. Pt currently with functional limitations due to the deficits listed below (see OT Problem List).  Pt will benefit from skilled OT to increase their safety and independence with ADL and functional mobility for ADL to facilitate discharge to venue listed below.      Follow Up Recommendations  Supervision/Assistance - 24 hour;Home health OT    Equipment Recommendations  None recommended by OT    Recommendations for Other Services       Precautions / Restrictions Precautions Precautions: Fall Precaution Comments: monitor HR with mobilization Restrictions Weight Bearing Restrictions: No      Mobility Bed Mobility Overal bed mobility: Needs Assistance Bed Mobility: Supine to Sit     Supine to sit: Min assist     General bed mobility comments: min/guard for safety, assist with lines/linens  Transfers Overall transfer level: Needs assistance Equipment used: 1 person hand held assist Transfers: Sit to/from Stand;Stand Pivot Transfers Sit to Stand: Min assist Stand pivot transfers: Min assist       General transfer comment: bed to [facility]        ADL either performed or assessed with clinical judgement   ADL Overall ADL's : Needs assistance/impaired Eating/Feeding: Set up;Sitting   Grooming: Set up;Sitting   Upper Body Bathing: Minimal assistance;Sitting   Lower Body Bathing: Maximal assistance;Sit to/from stand;Cueing for safety;Cueing for sequencing   Upper Body Dressing : Minimal assistance;Sitting   Lower Body Dressing: Maximal assistance;Sit to/from stand;Cueing for safety;Cueing for  sequencing   Toilet Transfer: Stand-pivot;Minimal assistance;BSC   Toileting- Clothing Manipulation and Hygiene: Maximal assistance;Sit to/from stand;Cueing for safety;Cueing for sequencing         General ADL Comments: pt has aide at home.  Family also provides A     Vision Patient Visual Report: No change from baseline       Perception     Praxis      Pertinent Vitals/Pain Pain Assessment: No/denies pain     Hand Dominance     Extremity/Trunk Assessment Upper Extremity Assessment Upper Extremity Assessment: RUE deficits/detail RUE Deficits / Details: pt with decreased strength in RUE with fxal activity s/p CVA several years ago       Cervical / Trunk Assessment Cervical / Trunk Assessment: Normal   Communication Communication Communication: Expressive difficulties   Cognition Arousal/Alertness: Awake/alert Behavior During Therapy: WFL for tasks assessed/performed Overall Cognitive Status: Within Functional Limits for tasks assessed                                                Home Living Family/patient expects to be discharged to:: Private residence Living Arrangements: Alone Available Help at Discharge: Personal care attendant;Other (Comment);Family;Available PRN/intermittently(Aide from 9-12, every day) Type of Home: Apartment Home Access: Level entry     Home Layout: One level     Bathroom Shower/Tub: Teacher, early years/pre: Handicapped height     Home Equipment: Environmental consultant - 2 wheels;Cane - single point   Additional Comments: aide comes 4 hrs 7d/wk  Prior Functioning/Environment Level of Independence: Needs assistance  Gait / Transfers Assistance Needed: amb with cane ADL's / Homemaking Assistance Needed: aide assists with bathing, home care, occasionally cooking Communication / Swallowing Assistance Needed: aphasis          OT Problem List: Decreased strength;Decreased activity tolerance;Impaired  balance (sitting and/or standing);Decreased coordination;Impaired UE functional use      OT Treatment/Interventions: Self-care/ADL training;Patient/family education;DME and/or AE instruction;Therapeutic activities    OT Goals(Current goals can be found in the care plan section) Acute Rehab OT Goals Patient Stated Goal: to go home OT Goal Formulation: With patient Time For Goal Achievement: 08/02/17  OT Frequency: Min 2X/week   Barriers to D/C:            Co-evaluation              AM-PAC PT "6 Clicks" Daily Activity     Outcome Measure Help from another person eating meals?: None Help from another person taking care of personal grooming?: A Little Help from another person toileting, which includes using toliet, bedpan, or urinal?: A Lot Help from another person bathing (including washing, rinsing, drying)?: A Little Help from another person to put on and taking off regular upper body clothing?: A Little Help from another person to put on and taking off regular lower body clothing?: A Lot 6 Click Score: 17   End of Session Equipment Utilized During Treatment: Gait belt Nurse Communication: Mobility status  Activity Tolerance: Patient tolerated treatment well Patient left: Other (comment)(on BSC- daugther present)  OT Visit Diagnosis: Unsteadiness on feet (R26.81);Muscle weakness (generalized) (M62.81)                Time: 5701-7793 OT Time Calculation (min): 24 min Charges:  OT General Charges $OT Visit: 1 Visit OT Evaluation $OT Eval Moderate Complexity: 1 Mod OT Treatments $Self Care/Home Management : 8-22 mins G-CodesKari Baars, [place]  Payton Mccallum D 07/19/2017, 11:55 AM

## 2017-07-19 NOTE — Progress Notes (Signed)
CRITICAL VALUE ALERT  Critical Value:  Potassium  Date & Time Notied:  5:37 AM 07/19/17  Provider Notified: Sch[place]  Orders Received/Actions taken: Paged on call

## 2017-07-20 DIAGNOSIS — I10 Essential (primary) hypertension: Secondary | ICD-10-CM

## 2017-07-20 DIAGNOSIS — M329 Systemic lupus erythematosus, unspecified: Secondary | ICD-10-CM

## 2017-07-20 DIAGNOSIS — J9601 Acute respiratory failure with hypoxia: Secondary | ICD-10-CM

## 2017-07-20 DIAGNOSIS — R000 Tachycardia, unspecified: Secondary | ICD-10-CM

## 2017-07-20 DIAGNOSIS — R140 Abdominal distension (gaseous): Secondary | ICD-10-CM

## 2017-07-20 LAB — GLUCOSE, CAPILLARY
Glucose-Capillary: 155 mg/dL — ABNORMAL HIGH (ref 65–99)
Glucose-Capillary: 153 mg/dL — ABNORMAL HIGH (ref 65–99)

## 2017-07-20 LAB — PROTIME-INR
INR: 2.11
Prothrombin Time: 23.4 seconds — ABNORMAL HIGH (ref 11.4–15.2)

## 2017-07-20 MED ORDER — PREDNISONE 10 MG PO TABS: ORAL_TABLET | ORAL | 0 refills | Status: DC

## 2017-07-20 MED ORDER — PREDNISONE 5 MG PO TABS: 5.0000 mg | ORAL_TABLET | Freq: Every day | ORAL | Status: DC

## 2017-07-20 MED ORDER — CEFUROXIME AXETIL 500 MG PO TABS: 500.0000 mg | ORAL_TABLET | Freq: Two times a day (BID) | ORAL | 0 refills | Status: AC

## 2017-07-20 MED ORDER — CEFUROXIME AXETIL 500 MG PO TABS
500.0000 mg | ORAL_TABLET | Freq: Two times a day (BID) | ORAL | Status: DC
  Administered 2017-07-20: 500 mg via ORAL
  Filled 2017-07-20 (×2): qty 1

## 2017-07-20 MED ORDER — WARFARIN SODIUM 5 MG PO TABS
5.0000 mg | ORAL_TABLET | Freq: Once | ORAL | Status: AC
  Administered 2017-07-20: 5 mg via ORAL
  Filled 2017-07-20: qty 1

## 2017-07-20 MED ORDER — OSELTAMIVIR PHOSPHATE 75 MG PO CAPS: 75.0000 mg | ORAL_CAPSULE | Freq: Two times a day (BID) | ORAL | 0 refills | Status: AC

## 2017-07-20 NOTE — Discharge Summary (Signed)
Physician Discharge Summary  Connie Ruiz BOF:[id number] DOB: 1960/12/26 DOA: 07/16/2017  PCP: Renato Shin, MD  Admit date: 07/16/2017 Discharge date: 07/20/2017  Admitted From: home Disposition:  Home  Recommendations for Outpatient Follow-up:  1. Follow up with PCP in 1-2 weeks 2. Please obtain BMP/CBC in one week   Home Health:Yes Equipment/Devices:none  Discharge Condition:stable CODE STATUS:full Diet recommendation: Heart Healthy  Brief/Interim Summary: 57 year old female with a history of CVA on Coumadin, hokum, hypothyroidism, renal transplant, [name], GERD, essential hypertension came to the hospital with complains of cough and shortness of breath.  She was found to have a right lower lobe pneumonia and was flu positive.  She was started on Rocephin and azithromycin along with Tamiflu.  Antibiotics were changed from Rocephin and and azithromycin to vancomycin and Zosyn given her bacteremia compromised state.  Her MRSA swab was negative therefore vancomycin was discontinued.   Discharge Diagnoses:  Active Problems:   Essential hypertension   Hypomagnesemia   Hypokalemia   Type 2 diabetes mellitus with complication, without long-term current use of insulin (HCC)   CAP (community acquired pneumonia)   Acute respiratory failure with hypoxia ([place])   Abdominal bloating   Sinus tachycardia   SLE (systemic lupus erythematosus related syndrome) ([place])  Acute respiratory failure with hypoxia likely due to influenza and right lower lobe community-acquired pneumonia: He was started empirically on IV vancomycin and Zosyn, his MRSA swab was negative vancomycin was DC'd. He was also started on IV steroids which he was transitioned to oral steroids which she will continue as an outpatient for an additional 5 days. She will continue as an outpatient Tamiflu and cefuroxime as an outpatient.  Abdominal bloating and epigastric pain: Abdominal x-ray was done that showed no signs  of obstruction, she was started on Protonix twice a day and she was able to tolerate her meals.  Sinus tachycardia: Likely due to acute respiratory failure now resolved.  Next  Elevated total bilirubin: Now resolved.  Generalized weakness: PT OT was consulted recommended home health PT.  SLE: No changes made to her medication.  Diabetes mellitus type 2: No changes were made to her medication.  History of CVA: Continue Coumadin.  Essential hypertension: No changes were made to her medication.   Discharge Instructions  Discharge Instructions    Diet - low sodium heart healthy   Complete by:  As directed    Increase activity slowly   Complete by:  As directed      Allergies as of 07/20/2017      Reactions   Oxycodone-acetaminophen Shortness Of Breath, Nausea Only   Propoxyphene N-acetaminophen Shortness Of Breath, Nausea Only   Sulfonamide Derivatives Shortness Of Breath, Nausea Only   Adhesive [tape]    Codeine Nausea Only   Other Other (See Comments)   No blood, Jehovaeh Witness    Gabapentin Anxiety   twitching   Latex Rash   Metoprolol Rash   Morphine And Related Rash   IV site on arm is red, patient reports this is improving.  NO shortness of breath reported.   Rosiglitazone Rash      Medication List    TAKE these medications   alendronate 70 MG tablet Commonly known as:  FOSAMAX Take 1 tablet (70 mg total) by mouth once a week. Take on Saturdays with a full glass of water on an empty stomach.   atorvastatin 40 MG tablet Commonly known as:  LIPITOR Take 1 tablet (40 mg total) by mouth daily.  calcitRIOL 0.25 MCG capsule Commonly known as:  ROCALTROL TAKE 1 CAPSULE(0.25 MCG) BY MOUTH DAILY   cefUROXime 500 MG tablet Commonly known as:  CEFTIN Take 1 tablet (500 mg total) by mouth 2 (two) times daily with a meal for 3 days.   diltiazem 240 MG 24 hr capsule Commonly known as:  TIAZAC Take 1 capsule (240 mg total) by mouth daily.   donepezil 10  MG tablet Commonly known as:  ARICEPT TAKE 1 TABLET(10 MG) BY MOUTH AT BEDTIME   enoxaparin 80 MG/0.8ML injection Commonly known as:  LOVENOX Inject 0.8 mLs (80 mg total) into the skin every 12 (twelve) hours. As instructed by [facility]   esomeprazole 20 MG capsule Commonly known as:  NEXIUM Take 20 mg by mouth daily.   glucose blood test strip Commonly known as:  ONE TOUCH ULTRA TEST Use to check blood sugar 1 time per day   levothyroxine 125 MCG tablet Commonly known as:  SYNTHROID Take 1 tablet (125 mcg total) by mouth daily before breakfast. What changed:  Another medication with the same name was removed. Continue taking this medication, and follow the directions you see here.   losartan 100 MG tablet Commonly known as:  COZAAR Take 1 tablet (100 mg total) by mouth daily.   metFORMIN 500 MG 24 hr tablet Commonly known as:  GLUCOPHAGE-XR Take 1 tablet (500 mg total) by mouth daily with breakfast.   mirtazapine 30 MG tablet Commonly known as:  REMERON Take 1 tablet (30 mg total) by mouth at bedtime.   mirtazapine 15 MG tablet Commonly known as:  REMERON TAKE 1 TABLET(15 MG) BY MOUTH AT BEDTIME   mycophenolate 250 MG capsule Commonly known as:  CELLCEPT Take 250-500 mg by mouth 2 (two) times daily. 500 mg QAM and 250 mg in the evening   nystatin powder Commonly known as:  MYCOSTATIN/NYSTOP Apply 1 g topically 4 (four) times daily as needed. For yeast under breast   ondansetron 4 MG disintegrating tablet Commonly known as:  ZOFRAN ODT 80m ODT q4 hours prn nausea/vomiting   ONETOUCH DELICA LANCETS 322QMisc Use to check blood sugar 1 time per day   ONETOUCH VERIO IQ SYSTEM w/Device Kit Use to check blood sugar 1 time per day   oseltamivir 75 MG capsule Commonly known as:  TAMIFLU Take 1 capsule (75 mg total) by mouth 2 (two) times daily for 1 day.   oxybutynin 5 MG tablet Commonly known as:  DITROPAN Take 5 mg by mouth daily as needed for bladder  spasms.   predniSONE 10 MG tablet Commonly known as:  DELTASONE Takes 6 tablets for 1 days, then 5 tablets for 1 days, then 4 tablets for 1 days, then 3 tablets for 1 days, then 2 tabs for 1 days, then 1 tab for 1 days, and then stop. What changed:  You were already taking a medication with the same name, and this prescription was added. Make sure you understand how and when to take each.   predniSONE 5 MG tablet Commonly known as:  DELTASONE Take 1 tablet (5 mg total) by mouth daily. Start taking on:  07/25/2017 What changed:  These instructions start on 07/25/2017. If you are unsure what to do until then, ask your doctor or other care provider.   QUEtiapine 25 MG tablet Commonly known as:  SEROQUEL Take 1 tablet (25 mg total) by mouth at bedtime.   sertraline 100 MG tablet Commonly known as:  ZOLOFT Take 0.5 tablets (50  mg total) by mouth daily.   tacrolimus 1 MG capsule Commonly known as:  PROGRAF Take 1 mg by mouth 2 (two) times daily.   traMADol 50 MG tablet Commonly known as:  ULTRAM Take 1 tablet (50 mg total) by mouth every 8 (eight) hours as needed.   warfarin 5 MG tablet Commonly known as:  COUMADIN Take as directed. If you are unsure how to take this medication, talk to your nurse or doctor. Original instructions:  Take 1/2 to 1 tablet daily as directed by coumadin clinic What changed:  See the new instructions.       Allergies  Allergen Reactions  . Oxycodone-Acetaminophen Shortness Of Breath and Nausea Only  . Propoxyphene N-Acetaminophen Shortness Of Breath and Nausea Only  . Sulfonamide Derivatives Shortness Of Breath and Nausea Only  . Adhesive [Tape]   . Codeine Nausea Only  . Other Other (See Comments)    No blood, Jehovaeh Witness   . Gabapentin Anxiety    twitching  . Latex Rash  . Metoprolol Rash  . Morphine And Related Rash    IV site on arm is red, patient reports this is improving.  NO shortness of breath reported.  . Rosiglitazone Rash     Consultations:  None   Procedures/Studies: Dg Chest 2 View  Result Date: 07/16/2017 CLINICAL DATA:  56-year-old female with cough, congestion, fever and body aches for the past 4 days EXAM: CHEST  2 VIEW COMPARISON:  Prior chest x-ray 09/23/2016 FINDINGS: New patchy airspace opacity in the right lower lobe concerning for bronchopneumonia given the clinical history. There is associated elevation of the right hemidiaphragm. Stable mild cardiomegaly. Background bronchitic changes and interstitial prominence are similar compared to prior. No acute osseous abnormality. IMPRESSION: 1. Right lower lobe pneumonia. Followup PA and lateral chest X-ray is recommended in 3-4 weeks following trial of antibiotic therapy to ensure resolution and exclude underlying malignancy. 2. Elevation of the right hemidiaphragm likely secondary to right lower lobe volume loss. Electronically Signed   By: Heath  McCullough M.D.   On: 07/16/2017 13:31   Dg Abd 1 View  Result Date: 07/19/2017 CLINICAL DATA:  Abdominal pain and distension EXAM: ABDOMEN - 1 VIEW COMPARISON:  CT abdomen and pelvis June 15, 2015 FINDINGS: There is no appreciable bowel dilatation or air-fluid level to suggest bowel obstruction. No free air. There are postoperative changes on the right. No evident bone lesions. IMPRESSION: No evident bowel obstruction or free air. Electronically Signed   By: William  Woodruff III M.D.   On: 07/19/2017 10:39     Subjective:  Feels great no new complaints. Discharge Exam: Vitals:   07/19/17 2137 07/20/17 0432  BP: (!) 164/87 (!) 151/84  Pulse: 63 75  Resp: 18 16  Temp: 98 F (36.7 C) 97.9 F (36.6 C)  SpO2: 96% 100%   Vitals:   07/19/17 1421 07/19/17 2137 07/20/17 0432 07/20/17 0538  BP: (!) 141/86 (!) 164/87 (!) 151/84   Pulse: 76 63 75   Resp: 16 18 16   Temp: 98 F (36.7 C) 98 F (36.7 C) 97.9 F (36.6 C)   TempSrc: Oral Oral Oral   SpO2: 100% 96% 100%   Weight:    83.2 kg (183 lb 8 oz)   Height:        General: Pt is alert, awake, not in acute distress Cardiovascular: RRR, S1/S2 +, no rubs, no gallops Respiratory: CTA bilaterally, no wheezing, no rhonchi Abdominal: Soft, NT, ND, bowel sounds + Extremities: no   edema, no cyanosis    The results of significant diagnostics from this hospitalization (including imaging, microbiology, ancillary and laboratory) are listed below for reference.     Microbiology: Recent Results (from the past 240 hour(s))  Blood culture (routine x 2)     Status: None (Preliminary result)   Collection Time: 07/16/17  3:40 PM  Result Value Ref Range Status   Specimen Description   Final    BLOOD RIGHT WRIST Performed at [facility], [address] [place], [place] [address]    Special Requests   Final    BOTTLES DRAWN AEROBIC AND ANAEROBIC Blood Culture adequate volume Performed at [facility], [address] [place], [place] [address]    Culture   Final    NO GROWTH 2 DAYS Performed at [facility], [address] [place], [place] [address]    Report Status PENDING  Incomplete  MRSA PCR Screening     Status: None   Collection Time: 07/16/17 11:23 PM  Result Value Ref Range Status   MRSA by PCR NEGATIVE NEGATIVE Final    Comment:        The GeneXpert MRSA Assay (FDA approved for NASAL specimens only), is one component of a comprehensive MRSA colonization surveillance program. It is not intended to diagnose MRSA infection nor to guide or monitor treatment for MRSA infections. Performed at  [facility], [address] [place], [place] [address]      Labs: BNP (last 3 results) No results for input(s): BNP in the last 8760 hours. Basic Metabolic Panel: Recent Labs  Lab 07/16/17 1240 07/17/17 0328 07/18/17 0643 07/19/17 0417  NA 134* 136 142 141  K 3.4* 3.6 3.3* 2.6*  CL 103 110 111 110  CO2 20* 19* 21* 21*  GLUCOSE 131* 258* 212* 288*  BUN 14 12 13 19   CREATININE 0.73 0.66 0.68 0.64  CALCIUM 8.4* 7.8* 8.6* 8.5*  MG  --   --   --  1.4*   Liver Function Tests: Recent Labs  Lab 07/16/17 1240 07/17/17 0328 07/18/17 0643 07/19/17 0417  AST 25 24 18 17  ALT 15 15 15 13*  ALKPHOS 70 55 56 46  BILITOT 1.4* 1.1 0.8 0.7  PROT 6.9 5.8* 6.4* 5.5*  ALBUMIN 3.3* 2.6* 3.1* 2.7*   Recent Labs  Lab 07/19/17 0417  LIPASE 60*   No results for input(s): AMMONIA in the last 168 hours. CBC: Recent Labs  Lab 07/16/17 1240 07/17/17 0328 07/18/17 0643 07/19/17 0417  WBC 8.0 4.2 12.0* 10.0  NEUTROABS 4.6  --   --   --   HGB 14.2 12.6 12.7 12.3  HCT 42.7 36.8 38.9 36.9  MCV 89.9 88.9 90.0 89.3  PLT 145* 116* 177 184   Cardiac Enzymes: No results for input(s): CKTOTAL, CKMB, CKMBINDEX, TROPONINI in the last 168 hours. BNP: Invalid input(s): POCBNP CBG: Recent Labs  Lab 07/19/17 0734 07/19/17 1249 07/19/17 1725 07/19/17 2210 07/20/17 0820  GLUCAP 206* 152* 177* 171* 155*   D-Dimer No results for input(s): DDIMER in the last 72 hours. Hgb A1c No results for input(s): HGBA1C in the last 72 hours. Lipid Profile No results for input(s): CHOL, HDL, LDLCALC, TRIG, CHOLHDL, LDLDIRECT in the last 72 hours. Thyroid function studies No results for input(s): TSH, T4TOTAL, T3FREE, THYROIDAB in the last 72 hours.  Invalid input(s): FREET3 Anemia work up No results for input(s): VITAMINB12, FOLATE, FERRITIN, TIBC, IRON, RETICCTPCT in the last 72   hours. Urinalysis    Component Value Date/Time   COLORURINE AMBER (A) 07/16/2017 1905   APPEARANCEUR CLEAR 07/16/2017 1905   LABSPEC 1.031 (H) 07/16/2017 1905   PHURINE 5.0 07/16/2017 1905   GLUCOSEU NEGATIVE 07/16/2017 1905   GLUCOSEU NEGATIVE 10/22/2013 1621   HGBUR NEGATIVE 07/16/2017 1905   BILIRUBINUR SMALL (A) 07/16/2017 1905   KETONESUR 80 (A) 07/16/2017 1905   PROTEINUR 30 (A) 07/16/2017 1905   UROBILINOGEN 1.0 03/27/2015 1534   NITRITE NEGATIVE 07/16/2017 1905   LEUKOCYTESUR  NEGATIVE 07/16/2017 1905   Sepsis Labs Invalid input(s): PROCALCITONIN,  WBC,  LACTICIDVEN Microbiology Recent Results (from the past 240 hour(s))  Blood culture (routine x 2)     Status: None (Preliminary result)   Collection Time: 07/16/17  3:40 PM  Result Value Ref Range Status   Specimen Description   Final    BLOOD RIGHT WRIST Performed at [facility], [place] [address], [place], [place] [address]    Special Requests   Final    BOTTLES DRAWN AEROBIC AND ANAEROBIC Blood Culture adequate volume Performed at [facility] [address], [place], [place] [address]    Culture   Final    NO GROWTH 2 DAYS Performed at [facility] [address], [place], [place] [address]    Report Status PENDING  Incomplete  MRSA PCR Screening     Status: None   Collection Time: 07/16/17 11:23 PM  Result Value Ref Range Status   MRSA by PCR NEGATIVE NEGATIVE Final    Comment:        The GeneXpert MRSA Assay (FDA approved for NASAL specimens only), is one component of a comprehensive MRSA colonization surveillance program. It is not intended to diagnose MRSA infection nor to guide or monitor treatment for MRSA infections. Performed at [facility], [place] [address], [place], [place] [address]      Time coordinating discharge: Over 30 minutes  SIGNED:   Charlynne Cousins, MD  [facility] 07/20/2017, 11:24 AM Pager   If 7PM-7AM, please contact night-coverage [email or web address] Password [email or web address]

## 2017-07-20 NOTE — Progress Notes (Signed)
Patient and family given medication, discharge, and follow up instructions, verbalized understanding, IV and telemetry removed, prescriptions and personal belongings with patient, family to transport home

## 2017-07-20 NOTE — Progress Notes (Signed)
PT Cancellation Note  Patient Details Name: Connie Ruiz MRN: [id number] DOB: [birth year]   Cancelled Treatment:    Reason Eval/Treat Not Completed: Patient declined, no reason specified Pt declined to participate.  Encouraged mobility prior to d/c however pt continued to decline PT.  Pt to d/c home today and states aide will assist her at home and daughter is picking her up.    Lauriel Helin,KATHrine E 07/20/2017, 10:49 AM Carmelia Bake, PT, DPT 07/20/2017 Pager: [phone]

## 2017-07-20 NOTE — Progress Notes (Addendum)
[facility] for Warfarin Indication: Hx PE, CVA  Allergies  Allergen Reactions  . Oxycodone-Acetaminophen Shortness Of Breath and Nausea Only  . Propoxyphene N-Acetaminophen Shortness Of Breath and Nausea Only  . Sulfonamide Derivatives Shortness Of Breath and Nausea Only  . Adhesive [Tape]   . Codeine Nausea Only  . Other Other (See Comments)    No blood, Jehovaeh Witness   . Gabapentin Anxiety    twitching  . Latex Rash  . Metoprolol Rash  . Morphine And Related Rash    IV site on arm is red, patient reports this is improving.  NO shortness of breath reported.  . Rosiglitazone Rash    Patient Measurements: Height: 5\' 4"  (162.6 cm) Weight: 183 lb 8 oz (83.2 kg) IBW/kg (Calculated) : 54.7  Vital Signs: Temp: 97.9 F (36.6 C) (02/13 0432) Temp Source: Oral (02/13 0432) BP: 151/84 (02/13 0432) Pulse Rate: 75 (02/13 0432)  Labs: Recent Labs    07/18/17 0643 07/19/17 0417 07/20/17 0415  HGB 12.7 12.3  --   HCT 38.9 36.9  --   PLT 177 184  --   LABPROT 17.3* 19.9* 23.4*  INR 1.43 1.71 2.11  CREATININE 0.68 0.64  --     Estimated Creatinine Clearance: 81.9 mL/min (by C-G formula based on SCr of 0.64 mg/dL).  Assessment: 76 yoF admitted on 2/9 with SOB, cough, concerning for pneumonia.  PMH is significant for PE and CVA on chronic warfarin anticoagulation.  Pharmacy is consulted to continue inpatient dosing.  PTA Warfarin reported as 5mg  daily except 2.5 mg on Wed/Sat.  Admission INR 1.19 Last [facility] office anticoag visit on 07/08/17 showed INR 3.1 with recommended change to warfarin 5mg  daily except 2.5 mg on M/W/F.  Today, 07/20/2017:  Missed dose of warfarin last night (2/12), however INR is now therapeutic (2.11)  CBC as of 2/12: Hgb stable, Plt increased back to WNL  No major drug-drug interactions noted, broad spectrum antibiotics may lead to prolonged INR  Lovenox was added yesterday for subtherapeutic  INR  Goal of Therapy:  INR 2-3 Monitor platelets by anticoagulation protocol: Yes   Plan:   Warfarin 5 mg PO x 1 today  At discharge, recommend resuming home regimen of 5 mg daily except 2.5 mg MWF  Daily PT/INR while inpatient   Monitor for signs and symptoms of bleeding.  INR now over 2.0, will d/c Lovenox  Reuel Boom, PharmD, BCPS [phone] 07/20/2017, 11:47 AM

## 2017-07-22 ENCOUNTER — Telehealth: Payer: Self-pay

## 2017-07-22 LAB — CULTURE, BLOOD (ROUTINE X 2)
Culture: NO GROWTH
Special Requests: ADEQUATE

## 2017-07-22 NOTE — Telephone Encounter (Signed)
Darlina Guys from [facility] called to inform Dr. Loanne Drilling that they will start PT with this patient on 07/27/17- this is just an [place]

## 2017-07-26 ENCOUNTER — Other Ambulatory Visit: Payer: Self-pay

## 2017-07-26 MED ORDER — ATORVASTATIN CALCIUM 40 MG PO TABS: 40.0000 mg | ORAL_TABLET | Freq: Every day | ORAL | 5 refills | Status: DC

## 2017-07-27 ENCOUNTER — Telehealth: Payer: Self-pay | Admitting: Endocrinology

## 2017-07-27 NOTE — Telephone Encounter (Signed)
Connie Ruiz from [facility], called stated they are going to start patient physical therapy Friday the 22nd   [phone]

## 2017-07-28 NOTE — Telephone Encounter (Signed)
Patient was just seen on 1/29?

## 2017-07-28 NOTE — Telephone Encounter (Signed)
fpor [facility] certification, it has to be after recent d/c

## 2017-07-28 NOTE — Telephone Encounter (Signed)
Patient called & stated that she could not make f/u right now due to her having the flu. She will callback to make appt when she [place] well, so she can be evaluated for physical therapy.

## 2017-07-28 NOTE — Telephone Encounter (Signed)
Ov is needed

## 2017-08-03 ENCOUNTER — Ambulatory Visit (INDEPENDENT_AMBULATORY_CARE_PROVIDER_SITE_OTHER): Payer: Medicare Other | Admitting: *Deleted

## 2017-08-03 DIAGNOSIS — Z7901 Long term (current) use of anticoagulants: Secondary | ICD-10-CM

## 2017-08-03 DIAGNOSIS — I2699 Other pulmonary embolism without acute cor pulmonale: Secondary | ICD-10-CM | POA: Diagnosis not present

## 2017-08-03 DIAGNOSIS — I6350 Cerebral infarction due to unspecified occlusion or stenosis of unspecified cerebral artery: Secondary | ICD-10-CM | POA: Diagnosis not present

## 2017-08-03 LAB — POCT INR: INR: 5.9

## 2017-08-03 NOTE — Patient Instructions (Signed)
Description   Skip today and tomorrow's dose, then continue taking  1 tablet daily except 1/2 tablets on  Mondays,   Wednesdays and Fridays.  Recheck INR in 1 week.   Call coumadin clinic with any questions  [phone]

## 2017-08-04 ENCOUNTER — Other Ambulatory Visit: Payer: Self-pay | Admitting: Endocrinology

## 2017-08-10 ENCOUNTER — Ambulatory Visit (INDEPENDENT_AMBULATORY_CARE_PROVIDER_SITE_OTHER): Payer: Medicare Other | Admitting: *Deleted

## 2017-08-10 DIAGNOSIS — Z7901 Long term (current) use of anticoagulants: Secondary | ICD-10-CM | POA: Diagnosis not present

## 2017-08-10 DIAGNOSIS — I6350 Cerebral infarction due to unspecified occlusion or stenosis of unspecified cerebral artery: Secondary | ICD-10-CM | POA: Diagnosis not present

## 2017-08-10 DIAGNOSIS — I2699 Other pulmonary embolism without acute cor pulmonale: Secondary | ICD-10-CM

## 2017-08-10 LAB — POCT INR: INR: 3.1

## 2017-08-10 NOTE — Patient Instructions (Signed)
Description   Tomorrow take 1/2 tablet, then start taking 1/2 tablet everyday except 1 tablet on Sundays, Tuesdays and Thursdays.   Recheck INR in 2 weeks.   Call coumadin clinic with any questions  [phone]

## 2017-08-11 ENCOUNTER — Ambulatory Visit: Payer: Medicare Other | Admitting: Obstetrics and Gynecology

## 2017-08-16 ENCOUNTER — Telehealth: Payer: Self-pay | Admitting: Endocrinology

## 2017-08-16 NOTE — Telephone Encounter (Signed)
Patient need a prescription new bed, the bed she has the motor broken. Please advise

## 2017-08-17 ENCOUNTER — Telehealth: Payer: Self-pay | Admitting: Endocrinology

## 2017-08-17 NOTE — Telephone Encounter (Incomplete)
Patient stated [facility] is where to send the prescriptionbut do not know the fax number

## 2017-08-17 NOTE — Telephone Encounter (Signed)
I printed  

## 2017-08-17 NOTE — Telephone Encounter (Signed)
I called and spoke with patient to find out where I needed to send prescription for her bed. She was going to call back with information on where to send prescription as well as fax number.

## 2017-08-23 ENCOUNTER — Ambulatory Visit (INDEPENDENT_AMBULATORY_CARE_PROVIDER_SITE_OTHER): Payer: Medicare Other | Admitting: Pharmacist

## 2017-08-23 DIAGNOSIS — Z7901 Long term (current) use of anticoagulants: Secondary | ICD-10-CM

## 2017-08-23 DIAGNOSIS — I2699 Other pulmonary embolism without acute cor pulmonale: Secondary | ICD-10-CM

## 2017-08-23 DIAGNOSIS — I6350 Cerebral infarction due to unspecified occlusion or stenosis of unspecified cerebral artery: Secondary | ICD-10-CM

## 2017-08-23 LAB — POCT INR: INR: 3.4

## 2017-08-23 NOTE — Patient Instructions (Signed)
Description   Skip your Coumadin today, then start taking 1/2 tablet everyday except 1 tablet on Sundays and Thursdays.   Recheck INR in 2 weeks. Call coumadin clinic with any questions  [phone]

## 2017-08-24 ENCOUNTER — Other Ambulatory Visit: Payer: Self-pay | Admitting: Endocrinology

## 2017-08-25 ENCOUNTER — Telehealth: Payer: Self-pay | Admitting: Endocrinology

## 2017-08-25 NOTE — Telephone Encounter (Signed)
Patient daughter is calling on the status of her mother moterized bed, please advise Please fax # [facility] phone# [phone]

## 2017-08-25 NOTE — Telephone Encounter (Signed)
I have faxed to [facility].

## 2017-08-31 ENCOUNTER — Telehealth: Payer: Self-pay | Admitting: Endocrinology

## 2017-08-31 NOTE — Telephone Encounter (Signed)
I have called and they are resending paperwork.

## 2017-08-31 NOTE — Telephone Encounter (Signed)
[facility] Is calling In regards to a prescription for the patient. (Incontinence Supply)   [phone] Ex 633

## 2017-08-31 NOTE — Telephone Encounter (Signed)
Daughter called re:Form for PA for [place] has not been received by [facility] yet -it has been almost 3 weeks since her bed has been broken and they have been trying to get this taken care of. Please call [name] (daughter) at ph# [phone] to give her the status of PA

## 2017-09-02 NOTE — Telephone Encounter (Signed)
We have received paperwork & has been given for Dr. Loanne Drilling to sign. When all is completed it will be faxed back.

## 2017-09-06 ENCOUNTER — Other Ambulatory Visit (HOSPITAL_COMMUNITY)
Admission: RE | Admit: 2017-09-06 | Discharge: 2017-09-06 | Disposition: A | Discharge status: Home / Self Care | Payer: Medicare Other | Source: Ambulatory Visit | Attending: Obstetrics and Gynecology | Admitting: Obstetrics and Gynecology

## 2017-09-06 ENCOUNTER — Encounter: Payer: Self-pay | Admitting: Obstetrics and Gynecology

## 2017-09-06 ENCOUNTER — Other Ambulatory Visit: Payer: Self-pay | Admitting: Endocrinology

## 2017-09-06 ENCOUNTER — Ambulatory Visit: Payer: Medicare Other | Admitting: Obstetrics and Gynecology

## 2017-09-06 ENCOUNTER — Other Ambulatory Visit: Payer: Self-pay

## 2017-09-06 VITALS — BP 130/86 | HR 56 | Resp 16 | Ht 62.25 in | Wt 168.0 lb

## 2017-09-06 DIAGNOSIS — N6310 Unspecified lump in the right breast, unspecified quadrant: Secondary | ICD-10-CM

## 2017-09-06 DIAGNOSIS — Z01419 Encounter for gynecological examination (general) (routine) without abnormal findings: Secondary | ICD-10-CM

## 2017-09-06 DIAGNOSIS — Z124 Encounter for screening for malignant neoplasm of cervix: Secondary | ICD-10-CM | POA: Diagnosis not present

## 2017-09-06 NOTE — Progress Notes (Signed)
Patient scheduled for bilateral diagnostic mammogram with right breast ultrasound on 4/8 at 10:40 am at the [facility]. Placed in mammogram hold.

## 2017-09-06 NOTE — Progress Notes (Signed)
57 y.o. G1P1001 DivorcedAfrican AmericanF here for annual exam. No vaginal bleeding. Not sexually active. Tolerable vasomotor symptoms.  Multiple medical issues.  Her sister died in 02-08-2023. She was her biggest support. She is close with her brother. Daughter is local, close to each other. No grand kids. Has friends.      No LMP recorded. Patient is postmenopausal.          Sexually active: No.  The current method of family planning is post menopausal status.    Exercising: Yes.    walking Smoker:  no  Health Maintenance: Pap:  Unsure  History of abnormal Pap:  no MMG:  05-16-13 left breast U/S- abnormal- did not follow up on Colonoscopy:  2018 normal  BMD:   09-26-16 Osteoporosis  TDaP:  03-26-09   Gardasil: N/A   reports that she has never smoked. She has never used smokeless tobacco. She reports that she does not drink alcohol or use drugs. On disability.   Past Medical History:  Diagnosis Date  . Candida esophagitis ([place]) 11/12/2014  . CEREBROVASCULAR ACCIDENT, ACUTE 04/15/2010  . CLOSTRIDIUM DIFFICILE COLITIS, HX OF 08/21/2007  . CONGESTIVE HEART FAILURE 08/21/2007  . Current use of long term anticoagulation    Dr. Andree Elk, [facility]  . CVA 04/17/2010  . Depression    Dr. Andree Elk, [place], TYPE II 08/21/2007  . DVT, HX OF 08/21/2007  . GERD 08/21/2007  . GOUT 08/21/2007  . History of stroke with residual effects   . HYPERLIPIDEMIA 08/21/2007  . HYPERTENSION 08/21/2007   Dr. Andree Elk, [place], HX OF 08/22/2007   s/p renal transplant-Dr. Andree Elk, [place] 08/21/2007  . OSTEOPOROSIS 08/21/2007   Rheumatol at [facility]  . Pulmonary embolism ([place]) 07/16/2010  . Renal failure   . RENAL INSUFFICIENCY 08/21/2007  . Right sided weakness   . Steroid-induced hyperglycemia 11/09/2014  . Tachycardia   . THYROID NODULE, LEFT 04/10/2009    Past Surgical History:  Procedure Laterality Date  . CHOLECYSTECTOMY    . ENTEROSCOPY N/A 11/11/2014   Procedure: ENTEROSCOPY;  Surgeon: Ladene Artist, MD;  Location: [facility] ENDOSCOPY;  Service: Endoscopy;  Laterality: N/A;  . KIDNEY TRANSPLANT Right 2009  . RENAL BIOPSY, OPEN  1981  . TUBAL LIGATION      Current Outpatient Medications  Medication Sig Dispense Refill  . alendronate (FOSAMAX) 70 MG tablet Take 1 tablet (70 mg total) by mouth once a week. Take on Saturdays with a full glass of water on an empty stomach. 12 tablet 3  . atorvastatin (LIPITOR) 40 MG tablet Take 1 tablet (40 mg total) by mouth daily. 30 tablet 5  . Blood Glucose Monitoring Suppl (ONETOUCH VERIO IQ SYSTEM) W/DEVICE KIT Use to check blood sugar 1 time per day 1 kit 0  . calcitRIOL (ROCALTROL) 0.25 MCG capsule TAKE 1 CAPSULE(0.25 MCG) BY MOUTH DAILY 30 capsule 0  . diltiazem (TIAZAC) 240 MG 24 hr capsule Take 1 capsule (240 mg total) by mouth daily. 30 capsule 11  . donepezil (ARICEPT) 10 MG tablet TAKE 1 TABLET(10 MG) BY MOUTH AT BEDTIME 30 tablet 0  . esomeprazole (NEXIUM) 20 MG capsule Take 20 mg by mouth daily.    Marland Kitchen glucose blood (ONE TOUCH ULTRA TEST) test strip Use to check blood sugar 1 time per day 100 each 2  . levothyroxine (SYNTHROID, LEVOTHROID) 125 MCG tablet TAKE 1 TABLET BY MOUTH DAILY BEFORE BREAKFAST 30 tablet 0  . losartan (COZAAR) 100 MG  tablet Take 1 tablet (100 mg total) by mouth daily. 30 tablet 5  . metFORMIN (GLUCOPHAGE-XR) 500 MG 24 hr tablet Take 1 tablet (500 mg total) by mouth daily with breakfast. 90 tablet 3  . mirtazapine (REMERON) 30 MG tablet Take 1 tablet (30 mg total) by mouth at bedtime. 30 tablet 1  . mycophenolate (CELLCEPT) 250 MG capsule Take 250-500 mg by mouth 2 (two) times daily. 500 mg QAM and 250 mg in the evening    . nystatin (MYCOSTATIN) powder Apply 1 g topically 4 (four) times daily as needed. For yeast under breast    . ondansetron (ZOFRAN ODT) 4 MG disintegrating tablet 72m ODT q4 hours prn nausea/vomiting 15 tablet 0  . ONETOUCH DELICA LANCETS 337CMISC Use to check  blood sugar 1 time per day 100 each 2  . oxybutynin (DITROPAN) 5 MG tablet Take 5 mg by mouth daily as needed for bladder spasms.     . predniSONE (DELTASONE) 5 MG tablet Take 1 tablet (5 mg total) by mouth daily.    . QUEtiapine (SEROQUEL) 25 MG tablet Take 1 tablet (25 mg total) by mouth at bedtime. 30 tablet 1  . sertraline (ZOLOFT) 100 MG tablet Take 0.5 tablets (50 mg total) by mouth daily. 15 tablet 2  . tacrolimus (PROGRAF) 1 MG capsule Take 1 mg by mouth 2 (two) times daily.     . traMADol (ULTRAM) 50 MG tablet Take 1 tablet (50 mg total) by mouth every 8 (eight) hours as needed. 30 tablet 0  . warfarin (COUMADIN) 5 MG tablet Take 1/2 to 1 tablet daily as directed by coumadin clinic 90 tablet 0   No current facility-administered medications for this visit.     Family History  Problem Relation Age of Onset  . Heart attack Mother   . Heart disease Father   . Asthma Sister   . Asthma Sister   . Asthma Daughter   . Cancer Maternal Grandfather        prostate  . Cancer Paternal Grandfather        colon  Sister died in [place] 15, 2024 not sure of what.   Review of Systems  Constitutional: Negative.   HENT: Negative.   Eyes: Negative.   Respiratory: Negative.   Cardiovascular: Negative.   Gastrointestinal: Negative.   Endocrine: Negative.   Genitourinary: Negative.   Musculoskeletal: Negative.   Skin: Negative.   Allergic/Immunologic: Negative.   Neurological: Negative.   Psychiatric/Behavioral: Negative.     Exam:   BP 130/86 (BP Location: Right Arm, Patient Position: Sitting, Cuff Size: Normal)   Pulse (!) 56   Resp 16   Ht 5' 2.25" (1.581 m)   Wt 168 lb (76.2 kg)   BMI 30.48 kg/m   Weight change: _0 @ Height:   Height: 5' 2.25" (158.1 cm)  Ht Readings from Last 3 Encounters:  09/06/17 5' 2.25" (1.581 m)  07/17/17 5' 4" (1.626 m)  04/15/17 5' 4" (1.626 m)    General appearance: alert, cooperative and appears stated age Head: Normocephalic, without obvious  abnormality, atraumatic Neck: no adenopathy, supple, symmetrical, trachea midline and thyroid normal to inspection and palpation Lungs: clear to auscultation bilaterally Cardiovascular: regular rate and rhythm Breasts: pendulous breasts. 2 pea sized lumps at 12 o'clock a few cm from the areolar region. Irregular lump at 3 o'clock, oval, less than a cm.  Abdomen: soft, non-tender; non distended,  no masses,  no organomegaly Extremities: extremities normal, atraumatic, no cyanosis or edema Skin: Skin color,  texture, turgor normal. No rashes or lesions Lymph nodes: Cervical, supraclavicular, and axillary nodes normal. No abnormal inguinal nodes palpated Neurologic: Grossly normal   Pelvic: External genitalia:  no lesions              Urethra:  normal appearing urethra with no masses, tenderness or lesions              Bartholins and Skenes: normal                 Vagina: normal appearing vagina with normal color and discharge, no lesions              Cervix: no lesions               Bimanual Exam:  Uterus:  normal size, contour, position, consistency, mobility, non-tender              Adnexa: no mass, fullness, tenderness               Rectovaginal: Confirms               Anus:  normal sphincter tone, no lesions  Chaperone was present for exam.  A:  Well Woman with normal exam  Osteoporosis on medication  Multiple medical problems, including CVA, heart disease, renal transplant, DM, elevated lipids. Followed by multiple providers  Breast lumps right breast at 12 and 3 o'clock  P:   Pap with hpv  Mammogram  Colonoscopy UTD  Labs with primary  Discussed breast self exam  Discussed calcium and vit D intake   Diagnostic breat imaging right side

## 2017-09-08 LAB — CYTOLOGY - PAP
Diagnosis: NEGATIVE
HPV: NOT DETECTED

## 2017-09-09 ENCOUNTER — Ambulatory Visit (INDEPENDENT_AMBULATORY_CARE_PROVIDER_SITE_OTHER): Payer: Medicare Other | Admitting: *Deleted

## 2017-09-09 DIAGNOSIS — I6350 Cerebral infarction due to unspecified occlusion or stenosis of unspecified cerebral artery: Secondary | ICD-10-CM

## 2017-09-09 DIAGNOSIS — Z7901 Long term (current) use of anticoagulants: Secondary | ICD-10-CM

## 2017-09-09 DIAGNOSIS — I2699 Other pulmonary embolism without acute cor pulmonale: Secondary | ICD-10-CM

## 2017-09-09 LAB — POCT INR: INR: 2.4

## 2017-09-09 NOTE — Patient Instructions (Signed)
Description   Continue taking 1/2 tablet everyday except 1 tablet on Sundays and Thursdays.  Recheck INR in 3 weeks. Call coumadin clinic with any questions  [phone]

## 2017-09-12 ENCOUNTER — Ambulatory Visit
Admission: RE | Admit: 2017-09-12 | Discharge: 2017-09-12 | Disposition: A | Discharge status: Home / Self Care | Payer: Medicare Other | Source: Ambulatory Visit | Attending: Obstetrics and Gynecology | Admitting: Obstetrics and Gynecology

## 2017-09-12 DIAGNOSIS — N6311 Unspecified lump in the right breast, upper outer quadrant: Secondary | ICD-10-CM | POA: Diagnosis not present

## 2017-09-12 DIAGNOSIS — N6310 Unspecified lump in the right breast, unspecified quadrant: Secondary | ICD-10-CM

## 2017-09-12 DIAGNOSIS — R928 Other abnormal and inconclusive findings on diagnostic imaging of breast: Secondary | ICD-10-CM | POA: Diagnosis not present

## 2017-09-12 DIAGNOSIS — N6312 Unspecified lump in the right breast, upper inner quadrant: Secondary | ICD-10-CM | POA: Diagnosis not present

## 2017-09-13 DIAGNOSIS — E1165 Type 2 diabetes mellitus with hyperglycemia: Secondary | ICD-10-CM | POA: Diagnosis not present

## 2017-09-13 DIAGNOSIS — I6789 Other cerebrovascular disease: Secondary | ICD-10-CM | POA: Diagnosis not present

## 2017-09-14 ENCOUNTER — Telehealth: Payer: Self-pay | Admitting: *Deleted

## 2017-09-14 NOTE — Telephone Encounter (Signed)
Notes recorded by Burnice Logan, RN on 09/14/2017 at 8:16 AM EDT Left message to call Sharee Pimple at [phone].

## 2017-09-14 NOTE — Telephone Encounter (Signed)
-----   Message from Salvadore Dom, MD sent at 09/12/2017  2:38 PM EDT ----- Breast lumps were c/w fat necrosis, recommend f/u breast exam in 3 months, please schedule. Please take out of mammogram hold.

## 2017-09-16 NOTE — Telephone Encounter (Signed)
Spoke with patient, advised as seen below per Dr. Talbert Nan. OV scheduled for 12/20/17 at 10am with Dr. Talbert Nan. Patient verbalizes understanding and is agreeable.  Patient request appointment details be mailed to her, confirmed mailing address.   Appointment details printed and placed in mail. Will close encounter.

## 2017-09-17 ENCOUNTER — Other Ambulatory Visit: Payer: Self-pay | Admitting: Endocrinology

## 2017-09-20 ENCOUNTER — Telehealth: Payer: Self-pay | Admitting: Endocrinology

## 2017-09-20 NOTE — Telephone Encounter (Signed)
Patient is calling to see if our office has received paperwork  That was being sent to [place].  She states it is a FL2 paper.   Please advise

## 2017-09-20 NOTE — Telephone Encounter (Signed)
I called patient & notified her that we had not received any paperwork for FL2 forms. Patient stated that she would have them send fax again.

## 2017-09-22 ENCOUNTER — Telehealth: Payer: Self-pay | Admitting: Endocrinology

## 2017-09-22 NOTE — Telephone Encounter (Signed)
I called and notified patient that [facility] paperwork was received & will be given to Dr. Loanne Drilling to be completed.

## 2017-09-22 NOTE — Telephone Encounter (Signed)
Patient is calling to see if we have received the fax for the [facility] paper that was being refaxed over to [place].  Please advise

## 2017-09-26 ENCOUNTER — Telehealth: Payer: Self-pay

## 2017-09-26 NOTE — Telephone Encounter (Signed)
I called patient & made her f/u appt earliest avaliable so that she can been seen about FL2 paperwork. I have lept FL2 paperwork in my file until patient comes in for visit.

## 2017-09-27 ENCOUNTER — Other Ambulatory Visit: Payer: Self-pay | Admitting: Endocrinology

## 2017-09-30 ENCOUNTER — Encounter: Payer: Self-pay | Admitting: Neurology

## 2017-09-30 ENCOUNTER — Ambulatory Visit (INDEPENDENT_AMBULATORY_CARE_PROVIDER_SITE_OTHER): Payer: Medicare Other | Admitting: *Deleted

## 2017-09-30 DIAGNOSIS — I2699 Other pulmonary embolism without acute cor pulmonale: Secondary | ICD-10-CM

## 2017-09-30 DIAGNOSIS — Z7901 Long term (current) use of anticoagulants: Secondary | ICD-10-CM | POA: Diagnosis not present

## 2017-09-30 DIAGNOSIS — I6350 Cerebral infarction due to unspecified occlusion or stenosis of unspecified cerebral artery: Secondary | ICD-10-CM

## 2017-09-30 LAB — POCT INR: INR: 2.2

## 2017-09-30 NOTE — Patient Instructions (Signed)
Description   Continue taking 1/2 tablet everyday except 1 tablet on Sundays and Thursdays.  Recheck INR in 4 weeks. Call coumadin clinic with any questions  [phone]

## 2017-10-12 ENCOUNTER — Other Ambulatory Visit: Payer: Self-pay

## 2017-10-12 MED ORDER — ESOMEPRAZOLE MAGNESIUM 20 MG PO CPDR: 20.0000 mg | DELAYED_RELEASE_CAPSULE | Freq: Every day | ORAL | 11 refills | Status: DC

## 2017-10-12 MED ORDER — DILTIAZEM HCL ER BEADS 240 MG PO CP24: 240.0000 mg | ORAL_CAPSULE | Freq: Every day | ORAL | 11 refills | Status: DC

## 2017-10-13 DIAGNOSIS — I6789 Other cerebrovascular disease: Secondary | ICD-10-CM | POA: Diagnosis not present

## 2017-10-13 DIAGNOSIS — E1165 Type 2 diabetes mellitus with hyperglycemia: Secondary | ICD-10-CM | POA: Diagnosis not present

## 2017-10-17 ENCOUNTER — Other Ambulatory Visit: Payer: Self-pay | Admitting: Cardiovascular Disease

## 2017-10-19 ENCOUNTER — Ambulatory Visit: Payer: Medicare Other | Admitting: Endocrinology

## 2017-10-27 ENCOUNTER — Other Ambulatory Visit: Payer: Self-pay | Admitting: Endocrinology

## 2017-10-27 ENCOUNTER — Other Ambulatory Visit: Payer: Self-pay | Admitting: Neurology

## 2017-10-28 ENCOUNTER — Ambulatory Visit (INDEPENDENT_AMBULATORY_CARE_PROVIDER_SITE_OTHER): Payer: Medicare Other

## 2017-10-28 DIAGNOSIS — I2699 Other pulmonary embolism without acute cor pulmonale: Secondary | ICD-10-CM | POA: Diagnosis not present

## 2017-10-28 DIAGNOSIS — I6350 Cerebral infarction due to unspecified occlusion or stenosis of unspecified cerebral artery: Secondary | ICD-10-CM

## 2017-10-28 DIAGNOSIS — Z7901 Long term (current) use of anticoagulants: Secondary | ICD-10-CM | POA: Diagnosis not present

## 2017-10-28 LAB — POCT INR: INR: 1.9 — AB (ref 2.0–3.0)

## 2017-10-28 NOTE — Patient Instructions (Signed)
Description   Take 1 tablet today, then resume same dosage 1/2 tablet everyday except 1 tablet on Sundays and Thursdays.  Recheck INR in 4 weeks. Call coumadin clinic with any questions  [phone]

## 2017-11-08 ENCOUNTER — Other Ambulatory Visit: Payer: Self-pay | Admitting: Endocrinology

## 2017-11-13 DIAGNOSIS — I6789 Other cerebrovascular disease: Secondary | ICD-10-CM | POA: Diagnosis not present

## 2017-11-13 DIAGNOSIS — E1165 Type 2 diabetes mellitus with hyperglycemia: Secondary | ICD-10-CM | POA: Diagnosis not present

## 2017-11-17 ENCOUNTER — Other Ambulatory Visit: Payer: Self-pay | Admitting: Endocrinology

## 2017-11-25 ENCOUNTER — Ambulatory Visit (INDEPENDENT_AMBULATORY_CARE_PROVIDER_SITE_OTHER): Payer: Medicare Other | Admitting: *Deleted

## 2017-11-25 DIAGNOSIS — I6350 Cerebral infarction due to unspecified occlusion or stenosis of unspecified cerebral artery: Secondary | ICD-10-CM | POA: Diagnosis not present

## 2017-11-25 DIAGNOSIS — Z5181 Encounter for therapeutic drug level monitoring: Secondary | ICD-10-CM | POA: Diagnosis not present

## 2017-11-25 DIAGNOSIS — G459 Transient cerebral ischemic attack, unspecified: Secondary | ICD-10-CM

## 2017-11-25 DIAGNOSIS — I2699 Other pulmonary embolism without acute cor pulmonale: Secondary | ICD-10-CM

## 2017-11-25 DIAGNOSIS — I6930 Unspecified sequelae of cerebral infarction: Secondary | ICD-10-CM | POA: Diagnosis not present

## 2017-11-25 DIAGNOSIS — Z86718 Personal history of other venous thrombosis and embolism: Secondary | ICD-10-CM

## 2017-11-25 DIAGNOSIS — Z7901 Long term (current) use of anticoagulants: Secondary | ICD-10-CM

## 2017-11-25 LAB — POCT INR: INR: 1.8 — AB (ref 2.0–3.0)

## 2017-11-25 NOTE — Patient Instructions (Signed)
Description   Today June 21st take 1 tablet then change dose of coumadin to  1/2 tablet everyday except 1 tablet on Sundays  Tuesdays and Thursdays.  Recheck INR in 2 weeks. Call coumadin clinic with any questions  [phone]

## 2017-12-05 ENCOUNTER — Ambulatory Visit: Payer: Medicare Other | Admitting: Endocrinology

## 2017-12-06 ENCOUNTER — Ambulatory Visit (INDEPENDENT_AMBULATORY_CARE_PROVIDER_SITE_OTHER): Payer: Medicare Other | Admitting: *Deleted

## 2017-12-06 DIAGNOSIS — Z7901 Long term (current) use of anticoagulants: Secondary | ICD-10-CM

## 2017-12-06 DIAGNOSIS — I6350 Cerebral infarction due to unspecified occlusion or stenosis of unspecified cerebral artery: Secondary | ICD-10-CM | POA: Diagnosis not present

## 2017-12-06 DIAGNOSIS — I2699 Other pulmonary embolism without acute cor pulmonale: Secondary | ICD-10-CM | POA: Diagnosis not present

## 2017-12-06 LAB — POCT INR: INR: 2.4 (ref 2.0–3.0)

## 2017-12-06 NOTE — Patient Instructions (Addendum)
Description   Continue taking 1/2 tablet everyday except 1 tablet on Sundays, Tuesdays, and Thursdays.  Recheck INR in 3 weeks. Call coumadin clinic with any questions  [phone]

## 2017-12-10 ENCOUNTER — Other Ambulatory Visit: Payer: Self-pay | Admitting: Neurology

## 2017-12-13 DIAGNOSIS — E1165 Type 2 diabetes mellitus with hyperglycemia: Secondary | ICD-10-CM | POA: Diagnosis not present

## 2017-12-13 DIAGNOSIS — I6789 Other cerebrovascular disease: Secondary | ICD-10-CM | POA: Diagnosis not present

## 2017-12-20 ENCOUNTER — Ambulatory Visit: Payer: Self-pay | Admitting: Obstetrics and Gynecology

## 2017-12-26 ENCOUNTER — Ambulatory Visit: Payer: Medicare Other | Admitting: Endocrinology

## 2017-12-27 ENCOUNTER — Ambulatory Visit (INDEPENDENT_AMBULATORY_CARE_PROVIDER_SITE_OTHER): Payer: Medicare Other | Admitting: Pharmacist

## 2017-12-27 DIAGNOSIS — I2699 Other pulmonary embolism without acute cor pulmonale: Secondary | ICD-10-CM | POA: Diagnosis not present

## 2017-12-27 DIAGNOSIS — Z7901 Long term (current) use of anticoagulants: Secondary | ICD-10-CM | POA: Diagnosis not present

## 2017-12-27 DIAGNOSIS — I6350 Cerebral infarction due to unspecified occlusion or stenosis of unspecified cerebral artery: Secondary | ICD-10-CM

## 2017-12-27 LAB — POCT INR: INR: 4.5 — AB (ref 2.0–3.0)

## 2017-12-27 MED ORDER — WARFARIN SODIUM 5 MG PO TABS: ORAL_TABLET | ORAL | 0 refills | Status: DC

## 2017-12-27 NOTE — Patient Instructions (Addendum)
Description   No warfarin today or tomorrow then continue taking 1/2 tablet everyday except 1 tablet on Sundays, Tuesdays, and Thursdays.  Recheck INR in 2 weeks. Call coumadin clinic with any questions  [phone]     Pt advised to make appt with Dr. Gwenlyn Found today.

## 2018-01-02 ENCOUNTER — Other Ambulatory Visit: Payer: Self-pay | Admitting: Endocrinology

## 2018-01-02 ENCOUNTER — Other Ambulatory Visit: Payer: Self-pay

## 2018-01-02 MED ORDER — LOSARTAN POTASSIUM 100 MG PO TABS: 100.0000 mg | ORAL_TABLET | Freq: Every day | ORAL | 5 refills | Status: DC

## 2018-01-10 ENCOUNTER — Ambulatory Visit (INDEPENDENT_AMBULATORY_CARE_PROVIDER_SITE_OTHER): Payer: Medicare Other

## 2018-01-10 DIAGNOSIS — I6350 Cerebral infarction due to unspecified occlusion or stenosis of unspecified cerebral artery: Secondary | ICD-10-CM | POA: Diagnosis not present

## 2018-01-10 DIAGNOSIS — I2699 Other pulmonary embolism without acute cor pulmonale: Secondary | ICD-10-CM

## 2018-01-10 DIAGNOSIS — Z7901 Long term (current) use of anticoagulants: Secondary | ICD-10-CM | POA: Diagnosis not present

## 2018-01-10 LAB — POCT INR: INR: 2.8 (ref 2.0–3.0)

## 2018-01-10 NOTE — Patient Instructions (Signed)
Description   Continue on same dosage 1/2 tablet everyday except 1 tablet on Sundays, Tuesdays, and Thursdays.  Recheck INR in 3 weeks. Call coumadin clinic with any questions  [phone]

## 2018-01-11 DIAGNOSIS — I120 Hypertensive chronic kidney disease with stage 5 chronic kidney disease or end stage renal disease: Secondary | ICD-10-CM | POA: Diagnosis not present

## 2018-01-11 DIAGNOSIS — I6789 Other cerebrovascular disease: Secondary | ICD-10-CM | POA: Diagnosis not present

## 2018-01-11 DIAGNOSIS — R079 Chest pain, unspecified: Secondary | ICD-10-CM | POA: Diagnosis not present

## 2018-01-11 DIAGNOSIS — Z8673 Personal history of transient ischemic attack (TIA), and cerebral infarction without residual deficits: Secondary | ICD-10-CM | POA: Diagnosis not present

## 2018-01-11 DIAGNOSIS — R1031 Right lower quadrant pain: Secondary | ICD-10-CM | POA: Diagnosis not present

## 2018-01-11 DIAGNOSIS — Z5320 Procedure and treatment not carried out because of patient's decision for unspecified reasons: Secondary | ICD-10-CM | POA: Diagnosis not present

## 2018-01-11 DIAGNOSIS — Z1623 Resistance to quinolones and fluoroquinolones: Secondary | ICD-10-CM | POA: Diagnosis not present

## 2018-01-11 DIAGNOSIS — B9620 Unspecified Escherichia coli [E. coli] as the cause of diseases classified elsewhere: Secondary | ICD-10-CM | POA: Diagnosis not present

## 2018-01-11 DIAGNOSIS — I444 Left anterior fascicular block: Secondary | ICD-10-CM | POA: Diagnosis not present

## 2018-01-11 DIAGNOSIS — K219 Gastro-esophageal reflux disease without esophagitis: Secondary | ICD-10-CM | POA: Diagnosis not present

## 2018-01-11 DIAGNOSIS — N3000 Acute cystitis without hematuria: Secondary | ICD-10-CM | POA: Diagnosis not present

## 2018-01-11 DIAGNOSIS — E785 Hyperlipidemia, unspecified: Secondary | ICD-10-CM | POA: Diagnosis not present

## 2018-01-11 DIAGNOSIS — Z940 Kidney transplant status: Secondary | ICD-10-CM | POA: Diagnosis not present

## 2018-01-11 DIAGNOSIS — I440 Atrioventricular block, first degree: Secondary | ICD-10-CM | POA: Diagnosis not present

## 2018-01-11 DIAGNOSIS — N186 End stage renal disease: Secondary | ICD-10-CM | POA: Diagnosis not present

## 2018-01-11 DIAGNOSIS — I10 Essential (primary) hypertension: Secondary | ICD-10-CM | POA: Diagnosis not present

## 2018-01-11 DIAGNOSIS — R072 Precordial pain: Secondary | ICD-10-CM | POA: Diagnosis not present

## 2018-01-11 DIAGNOSIS — M7989 Other specified soft tissue disorders: Secondary | ICD-10-CM | POA: Diagnosis not present

## 2018-01-11 DIAGNOSIS — E039 Hypothyroidism, unspecified: Secondary | ICD-10-CM | POA: Diagnosis not present

## 2018-01-11 DIAGNOSIS — N390 Urinary tract infection, site not specified: Secondary | ICD-10-CM | POA: Diagnosis not present

## 2018-01-11 DIAGNOSIS — M79675 Pain in left toe(s): Secondary | ICD-10-CM | POA: Diagnosis not present

## 2018-01-11 DIAGNOSIS — R2681 Unsteadiness on feet: Secondary | ICD-10-CM | POA: Diagnosis not present

## 2018-01-11 DIAGNOSIS — R5381 Other malaise: Secondary | ICD-10-CM | POA: Diagnosis not present

## 2018-01-11 DIAGNOSIS — Z4822 Encounter for aftercare following kidney transplant: Secondary | ICD-10-CM | POA: Diagnosis not present

## 2018-01-11 DIAGNOSIS — Z7901 Long term (current) use of anticoagulants: Secondary | ICD-10-CM | POA: Diagnosis not present

## 2018-01-11 DIAGNOSIS — E1165 Type 2 diabetes mellitus with hyperglycemia: Secondary | ICD-10-CM | POA: Diagnosis not present

## 2018-01-11 DIAGNOSIS — T8613 Kidney transplant infection: Secondary | ICD-10-CM | POA: Diagnosis not present

## 2018-01-12 DIAGNOSIS — M79675 Pain in left toe(s): Secondary | ICD-10-CM | POA: Diagnosis not present

## 2018-01-12 DIAGNOSIS — M7989 Other specified soft tissue disorders: Secondary | ICD-10-CM | POA: Diagnosis not present

## 2018-01-12 DIAGNOSIS — I639 Cerebral infarction, unspecified: Secondary | ICD-10-CM | POA: Insufficient documentation

## 2018-01-12 DIAGNOSIS — I440 Atrioventricular block, first degree: Secondary | ICD-10-CM | POA: Diagnosis not present

## 2018-01-12 DIAGNOSIS — I10 Essential (primary) hypertension: Secondary | ICD-10-CM | POA: Diagnosis not present

## 2018-01-12 DIAGNOSIS — R079 Chest pain, unspecified: Secondary | ICD-10-CM | POA: Diagnosis not present

## 2018-01-12 DIAGNOSIS — N390 Urinary tract infection, site not specified: Secondary | ICD-10-CM | POA: Diagnosis not present

## 2018-01-12 DIAGNOSIS — R2681 Unsteadiness on feet: Secondary | ICD-10-CM | POA: Diagnosis not present

## 2018-01-12 DIAGNOSIS — I444 Left anterior fascicular block: Secondary | ICD-10-CM | POA: Diagnosis not present

## 2018-01-12 DIAGNOSIS — Z8673 Personal history of transient ischemic attack (TIA), and cerebral infarction without residual deficits: Secondary | ICD-10-CM | POA: Diagnosis not present

## 2018-01-14 DIAGNOSIS — E785 Hyperlipidemia, unspecified: Secondary | ICD-10-CM | POA: Insufficient documentation

## 2018-01-14 DIAGNOSIS — E039 Hypothyroidism, unspecified: Secondary | ICD-10-CM | POA: Insufficient documentation

## 2018-01-15 MED ORDER — ACETAMINOPHEN 325 MG PO TABS
650.00 | ORAL_TABLET | ORAL | Status: DC
Start: ? — End: 2018-01-15

## 2018-01-15 MED ORDER — PANTOPRAZOLE SODIUM 40 MG PO TBEC
40.00 | DELAYED_RELEASE_TABLET | ORAL | Status: DC
Start: 2018-01-16 — End: 2018-01-15

## 2018-01-15 MED ORDER — TIZANIDINE HCL 2 MG PO TABS
2.00 | ORAL_TABLET | ORAL | Status: DC
Start: ? — End: 2018-01-15

## 2018-01-15 MED ORDER — MYCOPHENOLATE MOFETIL 250 MG PO CAPS
250.00 | ORAL_CAPSULE | ORAL | Status: DC
Start: 2018-01-15 — End: 2018-01-15

## 2018-01-15 MED ORDER — WARFARIN SODIUM 5 MG PO TABS
5.00 | ORAL_TABLET | ORAL | Status: DC
Start: 2018-01-16 — End: 2018-01-15

## 2018-01-15 MED ORDER — GENERIC EXTERNAL MEDICATION
1.00 | Status: DC
Start: ? — End: 2018-01-15

## 2018-01-15 MED ORDER — PREDNISONE 5 MG PO TABS
5.00 | ORAL_TABLET | ORAL | Status: DC
Start: 2018-01-16 — End: 2018-01-15

## 2018-01-15 MED ORDER — WARFARIN SODIUM 2.5 MG PO TABS
2.50 | ORAL_TABLET | ORAL | Status: DC
Start: 2018-01-17 — End: 2018-01-15

## 2018-01-15 MED ORDER — DILTIAZEM HCL ER COATED BEADS 120 MG PO CP24
240.00 | ORAL_CAPSULE | ORAL | Status: DC
Start: 2018-01-16 — End: 2018-01-15

## 2018-01-15 MED ORDER — ATORVASTATIN CALCIUM 40 MG PO TABS
40.00 | ORAL_TABLET | ORAL | Status: DC
Start: 2018-01-16 — End: 2018-01-15

## 2018-01-15 MED ORDER — POTASSIUM CHLORIDE CRYS ER 10 MEQ PO TBCR
EXTENDED_RELEASE_TABLET | ORAL | Status: DC
Start: 2018-01-16 — End: 2018-01-15

## 2018-01-15 MED ORDER — LOSARTAN POTASSIUM 50 MG PO TABS
100.00 | ORAL_TABLET | ORAL | Status: DC
Start: 2018-01-16 — End: 2018-01-15

## 2018-01-15 MED ORDER — LEVOTHYROXINE SODIUM 125 MCG PO TABS
125.00 | ORAL_TABLET | ORAL | Status: DC
Start: 2018-01-16 — End: 2018-01-15

## 2018-01-15 MED ORDER — CEPHALEXIN 500 MG PO CAPS
500.00 | ORAL_CAPSULE | ORAL | Status: DC
Start: 2018-01-15 — End: 2018-01-15

## 2018-01-15 MED ORDER — MAGNESIUM OXIDE 400 MG PO TABS
400.00 | ORAL_TABLET | ORAL | Status: DC
Start: 2018-01-15 — End: 2018-01-15

## 2018-01-16 NOTE — Progress Notes (Deleted)
GYNECOLOGY  VISIT   HPI: 57 y.o.   Divorced  [place] American  female   G1P1001 with No LMP recorded. Patient is postmenopausal.   here for 3 month breast recheck.    GYNECOLOGIC HISTORY: No LMP recorded. Patient is postmenopausal. Contraception: Post menopausal Menopausal hormone therapy: None        OB History    Gravida  1   Para  1   Term  1   Preterm      AB      Living  1     SAB      TAB      Ectopic      Multiple      Live Births  1              Patient Active Problem List   Diagnosis Date Noted  . Acute respiratory failure with hypoxia ([place]) 07/20/2017  . Abdominal bloating 07/20/2017  . Sinus tachycardia 07/20/2017  . SLE (systemic lupus erythematosus related syndrome) ([place]) 07/20/2017  . CAP (community acquired pneumonia) 07/16/2017  . Long term (current) use of anticoagulants [Z79.01] 06/21/2017  . Screening examination for infectious disease 01/28/2016  . Type 2 diabetes mellitus with complication, without long-term current use of insulin ([place]) 08/03/2015  . Vascular dementia 05/08/2015  . Hemiparesis as late effect of cerebrovascular accident (CVA) ([place]) 05/08/2015  . Aphasia as late effect of stroke 05/08/2015  . C. difficile colitis 02/25/2015  . Hypokalemia 02/25/2015  . CKD (chronic kidney disease) 02/25/2015  . Colitis 02/14/2015  . Sepsis ([place]) 02/14/2015  . Nausea vomiting and diarrhea 02/14/2015  . Abdominal pain 02/14/2015  . UTI (lower urinary tract infection) 02/14/2015  . Candida esophagitis ([place]) 11/12/2014  . Abnormal CT of the abdomen   . Steroid-induced hyperglycemia 11/09/2014  . Hypomagnesemia 11/06/2014  . Abdominal pain, acute   . Supratherapeutic INR 11/02/2014  . Jejunitis 11/02/2014  . Myalgia and myositis 04/05/2014  . Wellness examination 04/05/2014  . Menopausal state 04/05/2014  . Palpitations 08/31/2013  . Hyperlipidemia 08/31/2013  . Disorder of heart rhythm 08/13/2013  . TIA (transient ischemic  attack) 06/20/2013  . Gout flare: R elbow and R shoulder 06/20/2013  . Acute encephalopathy 06/14/2013  . Rhabdomyolysis 06/14/2013  . History of stroke with residual effects   . Right sided weakness   . Breast mass, left 04/30/2013  . Contusion of knee, left 10/18/2012  . Depression 10/05/2012  . Encounter for long-term (current) use of other medications 10/04/2012  . DVT (deep venous thrombosis) ([place]) 06/17/2011  . Expressive aphasia 06/17/2011  . Dysphasia 06/11/2011  . Incontinence of urine 06/11/2011  . Hand pain, left 06/11/2011  . Arm pain, left 05/07/2011  . Routine general medical examination at a health care facility 04/13/2011  . Anticoagulated 01/08/2011  . Pulmonary embolism ([place]) 07/16/2010  . Cerebral artery occlusion with cerebral infarction ([place]) 04/17/2010  . CEREBROVASCULAR ACCIDENT, ACUTE 04/15/2010  . THYROID NODULE, LEFT 04/10/2009  . COUGH 03/26/2009  . ACUTE BRONCHITIS 08/22/2007  . KIDNEY TRANSPLANTATION, HX OF 08/22/2007  . Gout 08/21/2007  . Essential hypertension 08/21/2007  . Chronic diastolic congestive heart failure ([place]) 08/21/2007  . GERD 08/21/2007  . Disorder resulting from impaired renal function 08/21/2007  . LUPUS 08/21/2007  . Osteoporosis 08/21/2007  . DVT, HX OF 08/21/2007  . Enteritis due to Clostridium difficile 08/21/2007    Past Medical History:  Diagnosis Date  . Anxiety   . Candida esophagitis ([place]) 11/12/2014  . CEREBROVASCULAR ACCIDENT,  ACUTE 04/15/2010  . CLOSTRIDIUM DIFFICILE COLITIS, HX OF 08/21/2007  . CONGESTIVE HEART FAILURE 08/21/2007  . Current use of long term anticoagulation    Dr. Andree Elk, [facility]  . CVA 04/17/2010  . Depression    Dr. Andree Elk, [place], TYPE II 08/21/2007  . DVT, HX OF 08/21/2007  . GERD 08/21/2007  . GOUT 08/21/2007  . History of stroke with residual effects   . HYPERLIPIDEMIA 08/21/2007  . HYPERTENSION 08/21/2007   Dr. Andree Elk, [employer], HX OF 08/22/2007    s/p renal transplant-Dr. Andree Elk, [place] 08/21/2007  . OSTEOPOROSIS 08/21/2007   Rheumatol at [facility]  . Pulmonary embolism ([place]) 07/16/2010  . Renal failure   . RENAL INSUFFICIENCY 08/21/2007  . Right sided weakness   . Steroid-induced hyperglycemia 11/09/2014  . Tachycardia   . THYROID NODULE, LEFT 04/10/2009    Past Surgical History:  Procedure Laterality Date  . CESAREAN SECTION    . CHOLECYSTECTOMY    . ENTEROSCOPY N/A 11/11/2014   Procedure: ENTEROSCOPY;  Surgeon: Ladene Artist, MD;  Location: [facility];  Service: Endoscopy;  Laterality: N/A;  . KIDNEY TRANSPLANT Right 2009  . RENAL BIOPSY, OPEN  1981  . TUBAL LIGATION      Current Outpatient Medications  Medication Sig Dispense Refill  . alendronate (FOSAMAX) 70 MG tablet TAKE 1 TABLET BY MOUTH ONCE A WEEK ON AN EMPTY STOMACH ON SATURDAYS WITH A FULL GLASS OF WATER. 12 tablet 0  . atorvastatin (LIPITOR) 40 MG tablet Take 1 tablet (40 mg total) by mouth daily. 30 tablet 5  . Blood Glucose Monitoring Suppl (ONETOUCH VERIO IQ SYSTEM) W/DEVICE KIT Use to check blood sugar 1 time per day 1 kit 0  . calcitRIOL (ROCALTROL) 0.25 MCG capsule TAKE 1 CAPSULE(0.25 MCG) BY MOUTH DAILY 30 capsule 0  . calcitRIOL (ROCALTROL) 0.25 MCG capsule TAKE 1 CAPSULE(0.25 MCG) BY MOUTH DAILY 30 capsule 0  . diltiazem (TIAZAC) 240 MG 24 hr capsule Take 1 capsule (240 mg total) by mouth daily. 30 capsule 11  . donepezil (ARICEPT) 10 MG tablet TAKE 1 TABLET(10 MG) BY MOUTH AT BEDTIME 30 tablet 0  . esomeprazole (NEXIUM) 20 MG capsule Take 1 capsule (20 mg total) by mouth daily. 30 capsule 11  . glucose blood (ONE TOUCH ULTRA TEST) test strip Use to check blood sugar 1 time per day 100 each 2  . levothyroxine (SYNTHROID, LEVOTHROID) 125 MCG tablet TAKE 1 TABLET BY MOUTH DAILY BEFORE BREAKFAST 30 tablet 0  . levothyroxine (SYNTHROID, LEVOTHROID) 125 MCG tablet TAKE 1 TABLET BY MOUTH DAILY BEFORE BREAKFAST 30 tablet 0  . losartan (COZAAR) 100 MG  tablet Take 1 tablet (100 mg total) by mouth daily. 30 tablet 5  . metFORMIN (GLUCOPHAGE-XR) 500 MG 24 hr tablet Take 1 tablet (500 mg total) by mouth daily with breakfast. 90 tablet 3  . mirtazapine (REMERON) 30 MG tablet Take 1 tablet (30 mg total) by mouth at bedtime. 30 tablet 1  . mycophenolate (CELLCEPT) 250 MG capsule Take 250-500 mg by mouth 2 (two) times daily. 500 mg QAM and 250 mg in the evening    . nystatin (MYCOSTATIN) powder Apply 1 g topically 4 (four) times daily as needed. For yeast under breast    . ondansetron (ZOFRAN ODT) 4 MG disintegrating tablet 76m ODT q4 hours prn nausea/vomiting 15 tablet 0  . ONETOUCH DELICA LANCETS 366AMISC Use to check blood sugar 1 time per day 100 each 2  .  oxybutynin (DITROPAN) 5 MG tablet Take 5 mg by mouth daily as needed for bladder spasms.     . predniSONE (DELTASONE) 5 MG tablet Take 1 tablet (5 mg total) by mouth daily.    . QUEtiapine (SEROQUEL) 25 MG tablet Take 1 tablet (25 mg total) by mouth at bedtime. 30 tablet 1  . sertraline (ZOLOFT) 100 MG tablet Take 0.5 tablets (50 mg total) by mouth daily. 15 tablet 2  . tacrolimus (PROGRAF) 1 MG capsule Take 1 mg by mouth 2 (two) times daily.     . traMADol (ULTRAM) 50 MG tablet Take 1 tablet (50 mg total) by mouth every 8 (eight) hours as needed. 30 tablet 0  . warfarin (COUMADIN) 5 MG tablet TAKE 1/2 TO 1 TABLET BY MOUTH DAILY AS DIRECTED BY COUMADIN CLINIC. NEES FOLLOW UP WITH CARDIOLOGIST 30 tablet 0   No current facility-administered medications for this visit.      ALLERGIES: Oxycodone-acetaminophen; Propoxyphene n-acetaminophen; Sulfonamide derivatives; Adhesive [tape]; Codeine; Other; Gabapentin; Latex; Metoprolol; Morphine and related; and Rosiglitazone  Family History  Problem Relation Age of Onset  . Heart attack Mother   . Heart disease Father   . Asthma Sister   . Asthma Sister   . Asthma Daughter   . Cancer Maternal Grandfather        prostate  . Cancer Paternal  Grandfather        colon    Social History   Socioeconomic History  . Marital status: Divorced    Spouse name: Not on file  . Number of children: 1  . Years of education: Not on file  . Highest education level: Not on file  Occupational History  . Occupation: DISABILITY    Employer: UNEMPLOYED  Social Needs  . Financial resource strain: Not on file  . Food insecurity:    Worry: Not on file    Inability: Not on file  . Transportation needs:    Medical: Not on file    Non-medical: Not on file  Tobacco Use  . Smoking status: Never Smoker  . Smokeless tobacco: Never Used  Substance and Sexual Activity  . Alcohol use: No    Alcohol/week: 0.0 standard drinks  . Drug use: No  . Sexual activity: Not Currently    Partners: Male    Birth control/protection: Post-menopausal  Lifestyle  . Physical activity:    Days per week: Not on file    Minutes per session: Not on file  . Stress: Not on file  Relationships  . Social connections:    Talks on phone: Not on file    Gets together: Not on file    Attends religious service: Not on file    Active member of club or organization: Not on file    Attends meetings of clubs or organizations: Not on file    Relationship status: Not on file  . Intimate partner violence:    Fear of current or ex partner: Not on file    Emotionally abused: Not on file    Physically abused: Not on file    Forced sexual activity: Not on file  Other Topics Concern  . Not on file  Social History Narrative   Retired   Regular exercise-no   Pt completed hs.     ROS  PHYSICAL EXAMINATION:    There were no vitals taken for this visit.    General appearance: alert, cooperative and appears stated age Neck: no adenopathy, supple, symmetrical, trachea midline and thyroid {CHL  AMB PHY EX THYROID NORM DEFAULT:[phone]::"normal to inspection and palpation"} Breasts: {Exam; breast:13139::"normal appearance, no masses or tenderness"} Abdomen: soft,  non-tender; non distended, no masses,  no organomegaly  Pelvic: External genitalia:  no lesions              Urethra:  normal appearing urethra with no masses, tenderness or lesions              Bartholins and Skenes: normal                 Vagina: normal appearing vagina with normal color and discharge, no lesions              Cervix: {CHL AMB PHY EX CERVIX NORM DEFAULT:[id number]::"no lesions"}              Bimanual Exam:  Uterus:  {CHL AMB PHY EX UTERUS NORM DEFAULT:[phone]::"normal size, contour, position, consistency, mobility, non-tender"}              Adnexa: {CHL AMB PHY EX ADNEXA NO MASS DEFAULT:[phone]::"no mass, fullness, tenderness"}              Rectovaginal: {yes no:[id number]}.  Confirms.              Anus:  normal sphincter tone, no lesions  Chaperone was present for exam.  ASSESSMENT     PLAN    An After Visit Summary was printed and given to the patient.  *** minutes face to face time of which over 50% was spent in counseling.

## 2018-01-18 ENCOUNTER — Ambulatory Visit: Payer: Self-pay | Admitting: Obstetrics and Gynecology

## 2018-01-23 ENCOUNTER — Ambulatory Visit: Payer: Medicare Other | Admitting: Endocrinology

## 2018-01-27 ENCOUNTER — Encounter: Payer: Self-pay | Admitting: Podiatry

## 2018-01-27 ENCOUNTER — Ambulatory Visit (INDEPENDENT_AMBULATORY_CARE_PROVIDER_SITE_OTHER): Payer: Medicare Other | Admitting: Podiatry

## 2018-01-27 DIAGNOSIS — M79609 Pain in unspecified limb: Secondary | ICD-10-CM | POA: Diagnosis not present

## 2018-01-27 DIAGNOSIS — B351 Tinea unguium: Secondary | ICD-10-CM | POA: Diagnosis not present

## 2018-01-27 DIAGNOSIS — L03032 Cellulitis of left toe: Secondary | ICD-10-CM

## 2018-01-27 DIAGNOSIS — E1159 Type 2 diabetes mellitus with other circulatory complications: Secondary | ICD-10-CM | POA: Diagnosis not present

## 2018-01-27 MED ORDER — CEPHALEXIN 500 MG PO CAPS: ORAL_CAPSULE | ORAL | 0 refills | Status: DC

## 2018-01-27 NOTE — Progress Notes (Signed)
This patient presents to the office with chief complaint of long thick painful nails.  She also has a growth noted on the base of her big toenail, left foot.  She says that her nails are painful walking and wearing her shoes.  She has a medical history of lupus, stroke as well as diabetes.  She presents the office today for preventative foot care services.  General Appearance  Alert, conversant and in no acute stress.  Vascular  Dorsalis pedis and posterior tibial  pulses are not  palpable  bilaterally.  Capillary return is within normal limits  bilaterally. Temperature is within normal limits  bilaterally.  Neurologic  Senn-Weinstein monofilament wire test within normal limits  bilaterally. Muscle power within normal limits bilaterally.  Nails Thick disfigured discolored nails with subungual debris  from hallux to fifth toes bilaterally. There is granulation tissue noted at the proximal nail fold of the left hallux medially.  Orthopedic  No limitations of motion of motion feet .  No crepitus or effusions noted.  No bony pathology or digital deformities noted.  Skin  normotropic skin with no porokeratosis noted bilaterally.  No signs of infections or ulcers noted.    Onychomycosis  Paronychia left hallux.  ROV  Debridement of nails  X 10.  Patient was prescribed cephalexin  taken bid  in an effort to eliminate the paronychia left hallux.  Home soaks were given.  Silvadene/DSD.  RTC 3 months.   Gardiner Barefoot DPM

## 2018-01-31 ENCOUNTER — Ambulatory Visit (INDEPENDENT_AMBULATORY_CARE_PROVIDER_SITE_OTHER): Payer: Medicare Other | Admitting: *Deleted

## 2018-01-31 DIAGNOSIS — Z7901 Long term (current) use of anticoagulants: Secondary | ICD-10-CM | POA: Diagnosis not present

## 2018-01-31 DIAGNOSIS — I2699 Other pulmonary embolism without acute cor pulmonale: Secondary | ICD-10-CM | POA: Diagnosis not present

## 2018-01-31 DIAGNOSIS — I6350 Cerebral infarction due to unspecified occlusion or stenosis of unspecified cerebral artery: Secondary | ICD-10-CM | POA: Diagnosis not present

## 2018-01-31 LAB — POCT INR: INR: 2.5 (ref 2.0–3.0)

## 2018-01-31 NOTE — Patient Instructions (Addendum)
   Description   Continue on same dosage you have been taking since [facility] discharge which is 1 tablet everyday except 1/2 tablet on Sundays, Tuesdays, and Thursdays.  Recheck INR in 3 weeks. Call coumadin clinic with any questions  [phone]

## 2018-02-01 ENCOUNTER — Ambulatory Visit: Payer: Medicare Other | Admitting: Cardiovascular Disease

## 2018-02-09 ENCOUNTER — Ambulatory Visit (INDEPENDENT_AMBULATORY_CARE_PROVIDER_SITE_OTHER): Payer: Medicare Other | Admitting: Obstetrics and Gynecology

## 2018-02-09 ENCOUNTER — Encounter: Payer: Self-pay | Admitting: Obstetrics and Gynecology

## 2018-02-09 VITALS — BP 152/88 | Ht 62.25 in | Wt 176.0 lb

## 2018-02-09 DIAGNOSIS — N63 Unspecified lump in unspecified breast: Secondary | ICD-10-CM

## 2018-02-09 NOTE — Progress Notes (Signed)
GYNECOLOGY  VISIT   HPI: 57 y.o.   Divorced  [place] American  female   G1P1001 with No LMP recorded. Patient is postmenopausal.   here for 3 month breast check.  She was noted to have lumps at 12 and 3 o'clock at her annual exam in 4/19. Imaging was c/w benign fat necrosis in the area's of concerns. She hasn't noticed any changes in her right breast. She does c/o a one week h/o soreness on her left nipple. No lumps.   GYNECOLOGIC HISTORY: No LMP recorded. Patient is postmenopausal. Contraception:post menopausal         OB History    Gravida  1   Para  1   Term  1   Preterm      AB      Living  1     SAB      TAB      Ectopic      Multiple      Live Births  1              Patient Active Problem List   Diagnosis Date Noted  . Acute respiratory failure with hypoxia ([place]) 07/20/2017  . Abdominal bloating 07/20/2017  . Sinus tachycardia 07/20/2017  . SLE (systemic lupus erythematosus related syndrome) ([place]) 07/20/2017  . CAP (community acquired pneumonia) 07/16/2017  . Long term (current) use of anticoagulants [Z79.01] 06/21/2017  . Screening examination for infectious disease 01/28/2016  . Type 2 diabetes mellitus with complication, without long-term current use of insulin ([place]) 08/03/2015  . Vascular dementia 05/08/2015  . Hemiparesis as late effect of cerebrovascular accident (CVA) ([place]) 05/08/2015  . Aphasia as late effect of stroke 05/08/2015  . C. difficile colitis 02/25/2015  . Hypokalemia 02/25/2015  . CKD (chronic kidney disease) 02/25/2015  . Colitis 02/14/2015  . Sepsis ([place]) 02/14/2015  . Nausea vomiting and diarrhea 02/14/2015  . Abdominal pain 02/14/2015  . UTI (lower urinary tract infection) 02/14/2015  . Candida esophagitis ([place]) 11/12/2014  . Abnormal CT of the abdomen   . Steroid-induced hyperglycemia 11/09/2014  . Hypomagnesemia 11/06/2014  . Abdominal pain, acute   . Supratherapeutic INR 11/02/2014  . Jejunitis 11/02/2014  . Myalgia  and myositis 04/05/2014  . Wellness examination 04/05/2014  . Menopausal state 04/05/2014  . Palpitations 08/31/2013  . Hyperlipidemia 08/31/2013  . Disorder of heart rhythm 08/13/2013  . TIA (transient ischemic attack) 06/20/2013  . Gout flare: R elbow and R shoulder 06/20/2013  . Acute encephalopathy 06/14/2013  . Rhabdomyolysis 06/14/2013  . History of stroke with residual effects   . Right sided weakness   . Breast mass, left 04/30/2013  . Contusion of knee, left 10/18/2012  . Depression 10/05/2012  . Encounter for long-term (current) use of other medications 10/04/2012  . DVT (deep venous thrombosis) ([place]) 06/17/2011  . Expressive aphasia 06/17/2011  . Dysphasia 06/11/2011  . Incontinence of urine 06/11/2011  . Hand pain, left 06/11/2011  . Arm pain, left 05/07/2011  . Routine general medical examination at a health care facility 04/13/2011  . Anticoagulated 01/08/2011  . Pulmonary embolism ([place]) 07/16/2010  . Cerebral artery occlusion with cerebral infarction ([place]) 04/17/2010  . CEREBROVASCULAR ACCIDENT, ACUTE 04/15/2010  . THYROID NODULE, LEFT 04/10/2009  . COUGH 03/26/2009  . ACUTE BRONCHITIS 08/22/2007  . KIDNEY TRANSPLANTATION, HX OF 08/22/2007  . Gout 08/21/2007  . Essential hypertension 08/21/2007  . Chronic diastolic congestive heart failure ([place]) 08/21/2007  . GERD 08/21/2007  . Disorder resulting from impaired renal  function 08/21/2007  . LUPUS 08/21/2007  . Osteoporosis 08/21/2007  . DVT, HX OF 08/21/2007  . Enteritis due to Clostridium difficile 08/21/2007    Past Medical History:  Diagnosis Date  . Anxiety   . Candida esophagitis ([place]) 11/12/2014  . CEREBROVASCULAR ACCIDENT, ACUTE 04/15/2010  . CLOSTRIDIUM DIFFICILE COLITIS, HX OF 08/21/2007  . CONGESTIVE HEART FAILURE 08/21/2007  . Current use of long term anticoagulation    Dr. Andree Elk, [facility]  . CVA 04/17/2010  . Depression    Dr. Andree Elk, [place], TYPE II 08/21/2007  . DVT, HX  OF 08/21/2007  . GERD 08/21/2007  . GOUT 08/21/2007  . History of stroke with residual effects   . HYPERLIPIDEMIA 08/21/2007  . HYPERTENSION 08/21/2007   Dr. Andree Elk, [place], HX OF 08/22/2007   s/p renal transplant-Dr. Andree Elk, [place] 08/21/2007  . OSTEOPOROSIS 08/21/2007   Rheumatol at [facility]  . Pulmonary embolism ([place]) 07/16/2010  . Renal failure   . RENAL INSUFFICIENCY 08/21/2007  . Right sided weakness   . Steroid-induced hyperglycemia 11/09/2014  . Tachycardia   . THYROID NODULE, LEFT 04/10/2009    Past Surgical History:  Procedure Laterality Date  . CESAREAN SECTION    . CHOLECYSTECTOMY    . ENTEROSCOPY N/A 11/11/2014   Procedure: ENTEROSCOPY;  Surgeon: Ladene Artist, MD;  Location: [facility];  Service: Endoscopy;  Laterality: N/A;  . KIDNEY TRANSPLANT Right 2009  . RENAL BIOPSY, OPEN  1981  . TUBAL LIGATION      Current Outpatient Medications  Medication Sig Dispense Refill  . alendronate (FOSAMAX) 70 MG tablet TAKE 1 TABLET BY MOUTH ONCE A WEEK ON AN EMPTY STOMACH ON SATURDAYS WITH A FULL GLASS OF WATER. 12 tablet 0  . atorvastatin (LIPITOR) 40 MG tablet Take 1 tablet (40 mg total) by mouth daily.    . Blood Glucose Monitoring Suppl (ONETOUCH VERIO IQ SYSTEM) W/DEVICE KIT Use to check blood sugar 1 time per day 1 kit 0  . calcitRIOL (ROCALTROL) 0.25 MCG capsule TAKE 1 CAPSULE(0.25 MCG) BY MOUTH DAILY 30 capsule 0  . calcitRIOL (ROCALTROL) 0.25 MCG capsule TAKE 1 CAPSULE(0.25 MCG) BY MOUTH DAILY 30 capsule 0  . cephALEXin (KEFLEX) 500 MG capsule TAKE 1 CAPSULE BY MOUTH EVERY 8 HOURS FOR 7 DAYS. END DATE 01/22/2018. 20 capsule 0  . diltiazem (TIAZAC) 240 MG 24 hr capsule Take 1 capsule (240 mg total) by mouth daily. 30 capsule 11  . donepezil (ARICEPT) 10 MG tablet TAKE 1 TABLET(10 MG) BY MOUTH AT BEDTIME    . esomeprazole (NEXIUM) 20 MG capsule Take 1 capsule (20 mg total) by mouth daily. 30 capsule 11  . glucose blood (ONE TOUCH ULTRA TEST)  test strip Use to check blood sugar 1 time per day 100 each 2  . levothyroxine (SYNTHROID, LEVOTHROID) 125 MCG tablet TAKE 1 TABLET BY MOUTH DAILY BEFORE BREAKFAST 30 tablet 0  . levothyroxine (SYNTHROID, LEVOTHROID) 125 MCG tablet TAKE 1 TABLET BY MOUTH DAILY BEFORE BREAKFAST 30 tablet 0  . losartan (COZAAR) 100 MG tablet Take 1 tablet (100 mg total) by mouth daily. 30 tablet 5  . magnesium oxide (MAG-OX) 400 MG tablet TAKE 2 TABLETS( 800 MG TOTAL) BY MOUTH DAILY    . metFORMIN (GLUCOPHAGE-XR) 500 MG 24 hr tablet Take 1 tablet (500 mg total) by mouth daily with breakfast. 90 tablet 3  . mirtazapine (REMERON) 30 MG tablet Take 1 tablet (30 mg total) by mouth at bedtime.  30 tablet 1  . mycophenolate (CELLCEPT) 250 MG capsule TAKE ONE CAPSULE BY MOUTH TWICE DAILY    . nystatin (MYCOSTATIN) powder Apply 1 g topically 4 (four) times daily as needed. For yeast under breast    . ondansetron (ZOFRAN ODT) 4 MG disintegrating tablet 1m ODT q4 hours prn nausea/vomiting 15 tablet 0  . ONETOUCH DELICA LANCETS 362BMISC Use to check blood sugar 1 time per day 100 each 2  . oxybutynin (DITROPAN) 5 MG tablet Take 5 mg by mouth daily as needed for bladder spasms.     . potassium chloride (K-DUR,KLOR-CON) 10 MEQ tablet TAKE 1 TABLET(10 MEQ) BY MOUTH DAILY    . predniSONE (DELTASONE) 5 MG tablet TAKE 1 TABLET(5 MG) BY MOUTH DAILY    . QUEtiapine (SEROQUEL) 25 MG tablet Take 1 tablet (25 mg total) by mouth at bedtime. 30 tablet 1  . sertraline (ZOLOFT) 100 MG tablet Take 0.5 tablets (50 mg total) by mouth daily. 15 tablet 2  . tacrolimus (PROGRAF) 1 MG capsule TAKE 2 CAPSULES (2MG) BY MOUTH IN MORNING. TAKE 1 CAPSULE (1MG) IN EVENING.    . traMADol (ULTRAM) 50 MG tablet Take 1 tablet (50 mg total) by mouth every 8 (eight) hours as needed. 30 tablet 0  . warfarin (COUMADIN) 5 MG tablet Take 1/2 tablet (2.5 mg) by mouth on Tuesday, Thursday and Sunday,t take 1 tablet (5 mg) all other days     No current  facility-administered medications for this visit.      ALLERGIES: Oxycodone-acetaminophen; Propoxyphene; Propoxyphene n-acetaminophen; Sulfonamide derivatives; Codeine; Hydrocodone-acetaminophen; Hydromorphone; Other; Oxycodone; Sulfamethoxazole; Tape; Gabapentin; Latex; Metoprolol; Morphine and related; and Rosiglitazone  Family History  Problem Relation Age of Onset  . Heart attack Mother   . Heart disease Father   . Asthma Sister   . Asthma Sister   . Asthma Daughter   . Cancer Maternal Grandfather        prostate  . Cancer Paternal Grandfather        colon    Social History   Socioeconomic History  . Marital status: Divorced    Spouse name: Not on file  . Number of children: 1  . Years of education: Not on file  . Highest education level: Not on file  Occupational History  . Occupation: DISABILITY    Employer: UNEMPLOYED  Social Needs  . Financial resource strain: Not on file  . Food insecurity:    Worry: Not on file    Inability: Not on file  . Transportation needs:    Medical: Not on file    Non-medical: Not on file  Tobacco Use  . Smoking status: Never Smoker  . Smokeless tobacco: Never Used  Substance and Sexual Activity  . Alcohol use: No    Alcohol/week: 0.0 standard drinks  . Drug use: No  . Sexual activity: Not Currently    Partners: Male    Birth control/protection: Post-menopausal  Lifestyle  . Physical activity:    Days per week: Not on file    Minutes per session: Not on file  . Stress: Not on file  Relationships  . Social connections:    Talks on phone: Not on file    Gets together: Not on file    Attends religious service: Not on file    Active member of club or organization: Not on file    Attends meetings of clubs or organizations: Not on file    Relationship status: Not on file  . Intimate partner  violence:    Fear of current or ex partner: Not on file    Emotionally abused: Not on file    Physically abused: Not on file    Forced  sexual activity: Not on file  Other Topics Concern  . Not on file  Social History Narrative   Retired   Regular exercise-no   Pt completed hs.     Review of Systems  Constitutional: Negative.        Weight gain  HENT: Negative.   Eyes: Negative.   Respiratory: Negative.   Cardiovascular: Positive for leg swelling.  Gastrointestinal: Positive for nausea and vomiting.  Genitourinary: Negative.   Musculoskeletal:       Swelling  Skin: Negative.   Neurological: Negative.   Endo/Heme/Allergies: Bruises/bleeds easily.  Psychiatric/Behavioral: Positive for depression and memory loss.  All other systems reviewed and are negative.   PHYSICAL EXAMINATION:    There were no vitals taken for this visit.    General appearance: alert, cooperative and appears stated age Breasts: large, pendulous breasts. In the right breast there are 2 pea sized firm lumps in the right breast at 12 o'clock a few cm from the areolar region and one pea sized lump at 3 o'clock a few cm from the areolar region. All of the lumps are hard, smooth and not tender. Not mobile. No lumps in the left breast, no abnormalities of her left nipple.    ASSESSMENT Stable right breast lumps, fat necrosis on imaging 1 week h/o left nipple irritation, normal exam    PLAN Call with any changes Call if her left nipple continues to hurt (think it may just be her skin, she will try Vaseline) F/U for annual exam in 4/20   An After Visit Summary was printed and given to the patient.

## 2018-02-13 DIAGNOSIS — E1165 Type 2 diabetes mellitus with hyperglycemia: Secondary | ICD-10-CM | POA: Diagnosis not present

## 2018-02-13 DIAGNOSIS — I6789 Other cerebrovascular disease: Secondary | ICD-10-CM | POA: Diagnosis not present

## 2018-02-21 ENCOUNTER — Encounter: Payer: Self-pay | Admitting: Endocrinology

## 2018-02-21 ENCOUNTER — Ambulatory Visit (INDEPENDENT_AMBULATORY_CARE_PROVIDER_SITE_OTHER): Payer: Medicare Other | Admitting: Endocrinology

## 2018-02-21 VITALS — BP 118/76 | HR 100 | Ht 62.0 in | Wt 177.0 lb

## 2018-02-21 DIAGNOSIS — M1000 Idiopathic gout, unspecified site: Secondary | ICD-10-CM | POA: Diagnosis not present

## 2018-02-21 DIAGNOSIS — I2699 Other pulmonary embolism without acute cor pulmonale: Secondary | ICD-10-CM | POA: Diagnosis not present

## 2018-02-21 DIAGNOSIS — Z992 Dependence on renal dialysis: Secondary | ICD-10-CM

## 2018-02-21 DIAGNOSIS — Z23 Encounter for immunization: Secondary | ICD-10-CM | POA: Diagnosis not present

## 2018-02-21 DIAGNOSIS — I6350 Cerebral infarction due to unspecified occlusion or stenosis of unspecified cerebral artery: Secondary | ICD-10-CM

## 2018-02-21 DIAGNOSIS — N186 End stage renal disease: Secondary | ICD-10-CM | POA: Diagnosis not present

## 2018-02-21 DIAGNOSIS — Z0000 Encounter for general adult medical examination without abnormal findings: Secondary | ICD-10-CM | POA: Diagnosis not present

## 2018-02-21 DIAGNOSIS — E876 Hypokalemia: Secondary | ICD-10-CM | POA: Diagnosis not present

## 2018-02-21 DIAGNOSIS — Z940 Kidney transplant status: Secondary | ICD-10-CM | POA: Diagnosis not present

## 2018-02-21 DIAGNOSIS — E1122 Type 2 diabetes mellitus with diabetic chronic kidney disease: Secondary | ICD-10-CM

## 2018-02-21 DIAGNOSIS — M810 Age-related osteoporosis without current pathological fracture: Secondary | ICD-10-CM | POA: Diagnosis not present

## 2018-02-21 LAB — BASIC METABOLIC PANEL
BUN: 13 mg/dL (ref 6–23)
CO2: 20 mEq/L (ref 19–32)
Calcium: 9.2 mg/dL (ref 8.4–10.5)
Chloride: 103 mEq/L (ref 96–112)
Creatinine, Ser: 0.55 mg/dL (ref 0.40–1.20)
GFR: 146.32 mL/min (ref >60.00)
Glucose, Bld: 108 mg/dL — ABNORMAL HIGH (ref 70–99)
Potassium: 3.2 mEq/L — ABNORMAL LOW (ref 3.5–5.1)
Sodium: 137 mEq/L (ref 135–145)

## 2018-02-21 LAB — VITAMIN D 25 HYDROXY (VIT D DEFICIENCY, FRACTURES): VITD: 11.99 ng/mL — ABNORMAL LOW (ref 30.00–100.00)

## 2018-02-21 LAB — TSH: TSH: 4.88 u[IU]/mL — ABNORMAL HIGH (ref 0.35–4.50)

## 2018-02-21 LAB — URIC ACID: Uric Acid, Serum: 1.9 mg/dL — ABNORMAL LOW (ref 2.4–7.0)

## 2018-02-21 LAB — POCT GLYCOSYLATED HEMOGLOBIN (HGB A1C): Hemoglobin A1C: 7 % — AB (ref 4.0–5.6)

## 2018-02-21 LAB — MAGNESIUM: Magnesium: 1.3 mg/dL — ABNORMAL LOW (ref 1.5–2.5)

## 2018-02-21 MED ORDER — ERGOCALCIFEROL 1.25 MG (50000 UT) PO CAPS: 50000.0000 [IU] | ORAL_CAPSULE | ORAL | 0 refills | Status: AC

## 2018-02-21 MED ORDER — POTASSIUM CHLORIDE ER 20 MEQ PO TBCR: 20.0000 meq | EXTENDED_RELEASE_TABLET | Freq: Every day | ORAL | 3 refills | Status: DC

## 2018-02-21 MED ORDER — LEVOTHYROXINE SODIUM 137 MCG PO TABS: 137.0000 ug | ORAL_TABLET | Freq: Every day | ORAL | 3 refills | Status: DC

## 2018-02-21 NOTE — Progress Notes (Signed)
Subjective:    Patient ID: Connie Ruiz, female    DOB: [birth year], 57 y.o.   MRN: [id number]  HPI Pt is here for regular wellness examination, and is feeling pretty well in general, and says chronic med probs are stable, except as noted below Past Medical History:  Diagnosis Date  . Anxiety   . Candida esophagitis ([place]) 11/12/2014  . CEREBROVASCULAR ACCIDENT, ACUTE 04/15/2010  . CLOSTRIDIUM DIFFICILE COLITIS, HX OF 08/21/2007  . CONGESTIVE HEART FAILURE 08/21/2007  . Current use of long term anticoagulation    Dr. Andree Elk, [facility]  . CVA 04/17/2010  . Depression    Dr. Andree Elk, [place], TYPE II 08/21/2007  . DVT, HX OF 08/21/2007  . GERD 08/21/2007  . GOUT 08/21/2007  . History of stroke with residual effects   . HYPERLIPIDEMIA 08/21/2007  . HYPERTENSION 08/21/2007   Dr. Andree Elk, [place], HX OF 08/22/2007   s/p renal transplant-Dr. Andree Elk, [place] 08/21/2007  . OSTEOPOROSIS 08/21/2007   [facility]ol at [facility]  . Pulmonary embolism ([place]) 07/16/2010  . Renal failure   . RENAL INSUFFICIENCY 08/21/2007  . Right sided weakness   . Steroid-induced hyperglycemia 11/09/2014  . Tachycardia   . THYROID NODULE, LEFT 04/10/2009    Past Surgical History:  Procedure Laterality Date  . CESAREAN SECTION    . CHOLECYSTECTOMY    . ENTEROSCOPY N/A 11/11/2014   Procedure: ENTEROSCOPY;  Surgeon: Ladene Artist, MD;  Location: [facility] END[facility]COPY;  Service: Endoscopy;  Laterality: N/A;  . KIDNEY TRANSPLANT Right 2009  . RENAL BIOPSY, OPEN  1981  . TUBAL LIGATION      Social History   Socioeconomic History  . Marital status: Divorced    Spouse name: Not on file  . Number of children: 1  . Years of education: Not on file  . Highest education level: Not on file  Occupational History  . Occupation: DISABILITY    Employer: UNEMPLOYED  Social Needs  . Financial resource strain: Not on file  . Food insecurity:    Worry: Not on file    Inability: Not on file    . Transportation needs:    Medical: Not on file    Non-medical: Not on file  Tobacco Use  . Smoking status: Never Smoker  . Smokeless tobacco: Never Used  Substance and Sexual Activity  . Alcohol use: No    Alcohol/week: 0.0 standard drinks  . Drug use: No  . Sexual activity: Not Currently    Partners: Male    Birth control/protection: Post-menopausal  Lifestyle  . Physical activity:    Days per week: Not on file    Minutes per session: Not on file  . Stress: Not on file  Relationships  . Social connections:    Talks on phone: Not on file    Gets together: Not on file    Attends religious service: Not on file    Active member of club or organization: Not on file    Attends meetings of clubs or organizations: Not on file    Relationship status: Not on file  . Intimate partner violence:    Fear of current or ex partner: Not on file    Emotionally abused: Not on file    Physically abused: Not on file    Forced sexual activity: Not on file  Other Topics Concern  . Not on file  Social History Narrative   Retired   Regular exercise-no  Pt completed hs.     Current Outpatient Medications on File Prior to Visit  Medication Sig Dispense Refill  . alendronate (FOSAMAX) 70 MG tablet TAKE 1 TABLET BY MOUTH ONCE A WEEK ON AN EMPTY STOMACH ON SATURDAYS WITH A FULL GLASS OF WATER. 12 tablet 0  . atorvastatin (LIPITOR) 40 MG tablet Take 1 tablet (40 mg total) by mouth daily.    . Blood Glucose Monitoring Suppl (ONETOUCH VERIO IQ SYSTEM) W/DEVICE KIT Use to check blood sugar 1 time per day 1 kit 0  . calcitRIOL (ROCALTROL) 0.25 MCG capsule TAKE 1 CAPSULE(0.25 MCG) BY MOUTH DAILY 30 capsule 0  . diltiazem (TIAZAC) 240 MG 24 hr capsule Take 1 capsule (240 mg total) by mouth daily. 30 capsule 11  . donepezil (ARICEPT) 10 MG tablet TAKE 1 TABLET(10 MG) BY MOUTH AT BEDTIME    . esomeprazole (NEXIUM) 20 MG capsule Take 1 capsule (20 mg total) by mouth daily. 30 capsule 11  . glucose blood  (ONE TOUCH ULTRA TEST) test strip Use to check blood sugar 1 time per day 100 each 2  . losartan (COZAAR) 100 MG tablet Take 1 tablet (100 mg total) by mouth daily. 30 tablet 5  . magnesium oxide (MAG-OX) 400 MG tablet TAKE 2 TABLETS( 800 MG TOTAL) BY MOUTH DAILY    . metFORMIN (GLUCOPHAGE-XR) 500 MG 24 hr tablet Take 1 tablet (500 mg total) by mouth daily with breakfast. 90 tablet 3  . mirtazapine (REMERON) 30 MG tablet Take 1 tablet (30 mg total) by mouth at bedtime. 30 tablet 1  . mycophenolate (CELLCEPT) 250 MG capsule TAKE ONE CAPSULE BY MOUTH TWICE DAILY    . nystatin (MYCOSTATIN) powder Apply 1 g topically 4 (four) times daily as needed. For yeast under breast    . ondansetron (ZOFRAN ODT) 4 MG disintegrating tablet 90m ODT q4 hours prn nausea/vomiting 15 tablet 0  . ONETOUCH DELICA LANCETS 319JMISC Use to check blood sugar 1 time per day 100 each 2  . oxybutynin (DITROPAN) 5 MG tablet Take 5 mg by mouth daily as needed for bladder spasms.     . predniSONE (DELTASONE) 5 MG tablet TAKE 1 TABLET(5 MG) BY MOUTH DAILY    . QUEtiapine (SEROQUEL) 25 MG tablet Take 1 tablet (25 mg total) by mouth at bedtime. 30 tablet 1  . sertraline (ZOLOFT) 100 MG tablet Take 0.5 tablets (50 mg total) by mouth daily. 15 tablet 2  . tacrolimus (PROGRAF) 1 MG capsule TAKE 2 CAPSULES (2MG) BY MOUTH IN MORNING. TAKE 1 CAPSULE (1MG) IN EVENING.    . traMADol (ULTRAM) 50 MG tablet Take 1 tablet (50 mg total) by mouth every 8 (eight) hours as needed. 30 tablet 0  . warfarin (COUMADIN) 5 MG tablet Take 1/2 tablet (2.5 mg) by mouth on Tuesday, Thursday and Sunday,t take 1 tablet (5 mg) all other days     No current facility-administered medications on file prior to visit.     Allergies  Allergen Reactions  . Oxycodone-Acetaminophen Shortness Of Breath and Nausea Only  . Propoxyphene Nausea Only and Shortness Of Breath    States takes tylenol at home  . Propoxyphene N-Acetaminophen Shortness Of Breath and Nausea  Only  . Sulfonamide Derivatives Shortness Of Breath and Nausea Only  . Codeine Nausea Only  . Hydrocodone-Acetaminophen   . Hydromorphone   . Other Other (See Comments)    No blood, Jehovaeh Witness   . Oxycodone   . Sulfamethoxazole   . Tape  Redness**PAPER TAPE OK**  . Gabapentin Anxiety    twitching  . Latex Rash  . Metoprolol Rash  . Morphine And Related Rash    IV site on arm is red, patient reports this is improving.  NO shortness of breath reported.  . Rosiglitazone Rash    Family History  Problem Relation Age of Onset  . Heart attack Mother   . Heart disease Father   . Asthma Sister   . Asthma Sister   . Asthma Daughter   . Cancer Maternal Grandfather        prostate  . Cancer Paternal Grandfather        colon    BP 118/76 (BP Location: Left Arm, Patient Position: Sitting)   Pulse 100   Ht '5\' 2"'  (1.575 m)   Wt 177 lb (80.3 kg)   SpO2 95%   BMI 32.37 kg/m     Review of Systems Denies fever, visual loss, hearing loss, chest pain, sob, back pain, BRBPR, hematuria, syncope, allergy sxs, and rash.  No change in chronic fatigue, rhinorrhea, foot numbness, easy bruising, or depression (sees psych).  She has cold intolerance.     Objective:   Physical Exam VS: see vs page GEN: no distress HEAD: head: no deformity eyes: no periorbital swelling, no proptosis external nose and ears are normal mouth: no lesion seen NECK: supple, thyroid is not enlarged CHEST WALL: no deformity LUNGS: clear to auscultation CV: reg rate and rhythm, no murmur ABD: abdomen is soft, nontender.  no hepatosplenomegaly.  not distended.  no hernia MUSCULOSKELETAL: muscle bulk and strength are grossly normal on the left and decreased on the right.  no obvious joint swelling.  gait is steady, with a cane PULSES: no carotid bruit NEURO:  cn 2-12 grossly intact, except for right facial weakness, and speech is slow.   readily moves all 4's. SKIN:  Normal texture and temperature.  No  rash or suspicious lesion is visible.  Healed ulcer at the right leg.   NODES:  None palpable at the neck PSYCH: alert, well-oriented.  Does not appear anxious nor depressed.        Assessment & Plan:  Wellness visit today, with problems stable, except as noted.  SEPARATE EVALUATION FOLLOWS--EACH PROBLEM HERE IS NEW, NOT RESPONDING TO TREATMENT, OR POSES SIGNIFICANT RISK TO THE PATIENT'S HEALTH: HISTORY OF THE PRESENT ILLNESS: Pt say she takes meds as rx'ed PAST MEDICAL HISTORY Past Medical History:  Diagnosis Date  . Anxiety   . Candida esophagitis ([place]) 11/12/2014  . CEREBROVASCULAR ACCIDENT, ACUTE 04/15/2010  . CLOSTRIDIUM DIFFICILE COLITIS, HX OF 08/21/2007  . CONGESTIVE HEART FAILURE 08/21/2007  . Current use of long term anticoagulation    Dr. Andree Elk, [facility]  . CVA 04/17/2010  . Depression    Dr. Andree Elk, [place], TYPE II 08/21/2007  . DVT, HX OF 08/21/2007  . GERD 08/21/2007  . GOUT 08/21/2007  . History of stroke with residual effects   . HYPERLIPIDEMIA 08/21/2007  . HYPERTENSION 08/21/2007   Dr. Andree Elk, [place], HX OF 08/22/2007   s/p renal transplant-Dr. Andree Elk, [place] 08/21/2007  . OSTEOPOROSIS 08/21/2007   [facility]umatol at [facility]  . Pulmonary embolism ([place]) 07/16/2010  . Renal failure   . RENAL INSUFFICIENCY 08/21/2007  . Right sided weakness   . Steroid-induced hyperglycemia 11/09/2014  . Tachycardia   . THYROID NODULE, LEFT 04/10/2009    Past Surgical History:  Procedure Laterality Date  . CESAREAN  SECTION    . CHOLECYSTECTOMY    . ENTEROSCOPY N/A 11/11/2014   Procedure: ENTEROSCOPY;  Surgeon: Ladene Artist, MD;  Location: [facility];  Service: Endoscopy;  Laterality: N/A;  . KIDNEY TRANSPLANT Right 2009  . RENAL BIOPSY, OPEN  1981  . TUBAL LIGATION      Social History   Socioeconomic History  . Marital status: Divorced    Spouse name: Not on file  . Number of children: 1  . Years of education: Not on file    . Highest education level: Not on file  Occupational History  . Occupation: DISABILITY    Employer: UNEMPLOYED  Social Needs  . Financial resource strain: Not on file  . Food insecurity:    Worry: Not on file    Inability: Not on file  . Transportation needs:    Medical: Not on file    Non-medical: Not on file  Tobacco Use  . Smoking status: Never Smoker  . Smokeless tobacco: Never Used  Substance and Sexual Activity  . Alcohol use: No    Alcohol/week: 0.0 standard drinks  . Drug use: No  . Sexual activity: Not Currently    Partners: Male    Birth control/protection: Post-menopausal  Lifestyle  . Physical activity:    Days per week: Not on file    Minutes per session: Not on file  . Stress: Not on file  Relationships  . Social connections:    Talks on phone: Not on file    Gets together: Not on file    Attends religious service: Not on file    Active member of club or organization: Not on file    Attends meetings of clubs or organizations: Not on file    Relationship status: Not on file  . Intimate partner violence:    Fear of current or ex partner: Not on file    Emotionally abused: Not on file    Physically abused: Not on file    Forced sexual activity: Not on file  Other Topics Concern  . Not on file  Social History Narrative   Retired   Regular exercise-no   Pt completed hs.     Current Outpatient Medications on File Prior to Visit  Medication Sig Dispense Refill  . alendronate (FOSAMAX) 70 MG tablet TAKE 1 TABLET BY MOUTH ONCE A WEEK ON AN EMPTY STOMACH ON SATURDAYS WITH A FULL GLASS OF WATER. 12 tablet 0  . atorvastatin (LIPITOR) 40 MG tablet Take 1 tablet (40 mg total) by mouth daily.    . Blood Glucose Monitoring Suppl (ONETOUCH VERIO IQ SYSTEM) W/DEVICE KIT Use to check blood sugar 1 time per day 1 kit 0  . calcitRIOL (ROCALTROL) 0.25 MCG capsule TAKE 1 CAPSULE(0.25 MCG) BY MOUTH DAILY 30 capsule 0  . diltiazem (TIAZAC) 240 MG 24 hr capsule Take 1  capsule (240 mg total) by mouth daily. 30 capsule 11  . donepezil (ARICEPT) 10 MG tablet TAKE 1 TABLET(10 MG) BY MOUTH AT BEDTIME    . esomeprazole (NEXIUM) 20 MG capsule Take 1 capsule (20 mg total) by mouth daily. 30 capsule 11  . glucose blood (ONE TOUCH ULTRA TEST) test strip Use to check blood sugar 1 time per day 100 each 2  . losartan (COZAAR) 100 MG tablet Take 1 tablet (100 mg total) by mouth daily. 30 tablet 5  . magnesium oxide (MAG-OX) 400 MG tablet TAKE 2 TABLETS( 800 MG TOTAL) BY MOUTH DAILY    . metFORMIN (GLUCOPHAGE-XR)  500 MG 24 hr tablet Take 1 tablet (500 mg total) by mouth daily with breakfast. 90 tablet 3  . mirtazapine (REMERON) 30 MG tablet Take 1 tablet (30 mg total) by mouth at bedtime. 30 tablet 1  . mycophenolate (CELLCEPT) 250 MG capsule TAKE ONE CAPSULE BY MOUTH TWICE DAILY    . nystatin (MYCOSTATIN) powder Apply 1 g topically 4 (four) times daily as needed. For yeast under breast    . ondansetron (ZOFRAN ODT) 4 MG disintegrating tablet 20m ODT q4 hours prn nausea/vomiting 15 tablet 0  . ONETOUCH DELICA LANCETS 316XMISC Use to check blood sugar 1 time per day 100 each 2  . oxybutynin (DITROPAN) 5 MG tablet Take 5 mg by mouth daily as needed for bladder spasms.     . predniSONE (DELTASONE) 5 MG tablet TAKE 1 TABLET(5 MG) BY MOUTH DAILY    . QUEtiapine (SEROQUEL) 25 MG tablet Take 1 tablet (25 mg total) by mouth at bedtime. 30 tablet 1  . sertraline (ZOLOFT) 100 MG tablet Take 0.5 tablets (50 mg total) by mouth daily. 15 tablet 2  . tacrolimus (PROGRAF) 1 MG capsule TAKE 2 CAPSULES (2MG) BY MOUTH IN MORNING. TAKE 1 CAPSULE (1MG) IN EVENING.    . traMADol (ULTRAM) 50 MG tablet Take 1 tablet (50 mg total) by mouth every 8 (eight) hours as needed. 30 tablet 0  . warfarin (COUMADIN) 5 MG tablet Take 1/2 tablet (2.5 mg) by mouth on Tuesday, Thursday and Sunday,t take 1 tablet (5 mg) all other days     No current facility-administered medications on file prior to visit.       Family History  Problem Relation Age of Onset  . Heart attack Mother   . Heart disease Father   . Asthma Sister   . Asthma Sister   . Asthma Daughter   . Cancer Maternal Grandfather        prostate  . Cancer Paternal Grandfather        colon    BP 118/76 (BP Location: Left Arm, Patient Position: Sitting)   Pulse 100   Ht '5\' 2"'  (1.575 m)   Wt 177 lb (80.3 kg)   SpO2 95%   BMI 32.37 kg/m   PHYSICAL EXAMINATION: VITAL SIGNS:  See vs page GENERAL: no distress Pulses: dorsalis pedis intact bilat.   MSK: no deformity of the feet CV: 1+ bilat leg edema Skin:  no ulcer on the feet.  normal temp on the feet.  Patchy hyperpigmentation of the legs and feet. Neuro: sensation is intact to touch on the feet EXTEMITIES:  There is bilateral onychomycosis of the toenails LAB/XRAY RESULTS: Lab Results  Component Value Date   HGBA1C 7.0 (A) 02/21/2018   Lab Results  Component Value Date   TSH 4.88 (H) 02/21/2018   Lab Results  Component Value Date   CREATININE 0.55 02/21/2018   BUN 13 02/21/2018   NA 137 02/21/2018   K 3.2 (L) 02/21/2018   CL 103 02/21/2018   CO2 20 02/21/2018  25-OH vit-D is low IMPRESSION: Vit-D def, persistent Hypokalemia: she needs increased rx Type 2 DM: well-controlled Hypothyroidism: she needs increased rx PLAN:  We need to increase the potassium and thyroid pills. I have sent a prescription to your pharmacy Also, your vitamin-D is low. I have sent a prescription to your pharmacy, to take this 3 times a week for 1 month, then you are done. Please continue the same metformin

## 2018-02-21 NOTE — Patient Instructions (Signed)
blood tests are requested for you today.  We'll let you know about the results. Please continue the same medications.   Please consider these measures for your health:  minimize alcohol.  Do not use tobacco products.  Have a colonoscopy at least every 10 years from age 57.  Women should have an annual mammogram from age 35.  Keep firearms safely stored.  Always use seat belts.  have working smoke alarms in your home.  See an eye doctor and dentist regularly.  Never drive under the influence of alcohol or drugs (including prescription drugs).   Best wishes with your new primary care provider.

## 2018-02-22 LAB — PTH, INTACT AND CALCIUM
Calcium: 9.1 mg/dL (ref 8.6–10.4)
PTH: 115 pg/mL — ABNORMAL HIGH (ref 14–64)

## 2018-02-23 ENCOUNTER — Ambulatory Visit (INDEPENDENT_AMBULATORY_CARE_PROVIDER_SITE_OTHER): Payer: Medicare Other | Admitting: *Deleted

## 2018-02-23 DIAGNOSIS — I6350 Cerebral infarction due to unspecified occlusion or stenosis of unspecified cerebral artery: Secondary | ICD-10-CM

## 2018-02-23 DIAGNOSIS — Z7901 Long term (current) use of anticoagulants: Secondary | ICD-10-CM | POA: Diagnosis not present

## 2018-02-23 DIAGNOSIS — I2699 Other pulmonary embolism without acute cor pulmonale: Secondary | ICD-10-CM

## 2018-02-23 LAB — POCT INR: INR: 3.2 — AB (ref 2.0–3.0)

## 2018-02-23 LAB — MYCOPHENOLIC ACID (CELLCEPT)
MPA Glucuronide: 11 ug/mL — ABNORMAL LOW (ref 15–125)
MPA: 1.1 ug/mL (ref 1.0–3.5)

## 2018-02-23 NOTE — Patient Instructions (Signed)
Description   Skip today's dose, then Continue on same dosage 1 tablet everyday except 1/2 tablet on Sundays, Tuesdays, and Thursdays.  Recheck INR in 2 weeks. Call coumadin clinic with any questions  [phone]

## 2018-02-28 ENCOUNTER — Other Ambulatory Visit: Payer: Self-pay

## 2018-02-28 NOTE — Patient Outreach (Signed)
[facility] Care Management  02/28/2018  RENARDA MULLINIX 1960/06/15 [id number]   Medication Adherence call to Mrs. Blanca Friend spoke she did not want to engage patient did not want to answer any question, patient is due on Atorvastatin 40 mg. Mrs. Fulfer is showing past due under [place].  [facility] Management Direct Dial [phone]  Fax [phone] [email or web address]

## 2018-03-03 ENCOUNTER — Other Ambulatory Visit: Payer: Self-pay | Admitting: Endocrinology

## 2018-03-10 ENCOUNTER — Encounter: Payer: Self-pay | Admitting: Cardiovascular Disease

## 2018-03-10 ENCOUNTER — Ambulatory Visit (INDEPENDENT_AMBULATORY_CARE_PROVIDER_SITE_OTHER): Payer: Medicare Other | Admitting: Pharmacist

## 2018-03-10 ENCOUNTER — Ambulatory Visit (INDEPENDENT_AMBULATORY_CARE_PROVIDER_SITE_OTHER): Payer: Medicare Other | Admitting: Cardiovascular Disease

## 2018-03-10 VITALS — BP 130/90 | HR 105 | Ht 64.0 in | Wt 175.4 lb

## 2018-03-10 DIAGNOSIS — E7800 Pure hypercholesterolemia, unspecified: Secondary | ICD-10-CM

## 2018-03-10 DIAGNOSIS — I6350 Cerebral infarction due to unspecified occlusion or stenosis of unspecified cerebral artery: Secondary | ICD-10-CM | POA: Diagnosis not present

## 2018-03-10 DIAGNOSIS — Z7901 Long term (current) use of anticoagulants: Secondary | ICD-10-CM

## 2018-03-10 DIAGNOSIS — R000 Tachycardia, unspecified: Secondary | ICD-10-CM

## 2018-03-10 DIAGNOSIS — I10 Essential (primary) hypertension: Secondary | ICD-10-CM

## 2018-03-10 DIAGNOSIS — I2699 Other pulmonary embolism without acute cor pulmonale: Secondary | ICD-10-CM

## 2018-03-10 LAB — POCT INR: INR: 3.7 — AB (ref 2.0–3.0)

## 2018-03-10 NOTE — Assessment & Plan Note (Signed)
History of hyperlipidemia on statin therapy with lipid profile performed 06/27/2017 revealing total cholesterol 166, LDL 84 and HDL of 81.

## 2018-03-10 NOTE — Patient Instructions (Signed)
Medication Instructions:  Your physician recommends that you continue on your current medications as directed. Please refer to the Current Medication list given to you today.   Labwork: none  Testing/Procedures: none  Follow-Up: Follow up with Dr. Berry as needed.   Any Other Special Instructions Will Be Listed Below (If Applicable).     If you need a refill on your cardiac medications before your next appointment, please call your pharmacy.   

## 2018-03-10 NOTE — Patient Instructions (Signed)
Skip today's dose ONLY 10/4, then resume at lower dose of 1/2 tablet everyday except 1 tablet on Monday, Wednesday and Friday.  Recheck INR in 2 weeks. Call coumadin clinic with any questions  [phone]

## 2018-03-10 NOTE — Assessment & Plan Note (Signed)
Chronic. 

## 2018-03-10 NOTE — Progress Notes (Signed)
03/10/2018 Connie Ruiz   04-09-1961  [id number]  Primary Physician Renato Shin, MD Primary Cardiologist: Lorretta Harp MD Lupe Carney, [place]  HPI:  Connie Ruiz is a 57 y.o.  severely overweight divorced African American American female mother of one daughter who is accompanying her today. She was referred by Dr. Renato Shin for cardiovascular evaluation because of "tachycardia".  I last saw her in the office 08/31/2013.  Her past history is remarkable for treated hypertension and hyperlipidemia. She apparently had systemic lupus erythematosus which he is disabled from. Her mother died of microfracture at age 67. She does not smoke nor is he diabetic. She has had several strokes in the past most recently in January of this year with some right-sided parses and numbness. She walks with the aid of a cane and is minimally ambulatory. She is getting stroke rehabilitation and home physical therapy. Apparently she's noticed mildly elevated heart rates during these sessions and was referred here. Since I saw her in the office 4 years ago she is remained stable.  She has had a thyroidectomy but denies chest pain or shortness of breath.   Current Meds  Medication Sig  . alendronate (FOSAMAX) 70 MG tablet TAKE 1 TABLET BY MOUTH ONCE A WEEK ON AN EMPTY STOMACH ON SATURDAYS WITH A FULL GLASS OF WATER.  Marland Kitchen atorvastatin (LIPITOR) 40 MG tablet Take 1 tablet (40 mg total) by mouth daily.  . Blood Glucose Monitoring Suppl (ONETOUCH VERIO IQ SYSTEM) W/DEVICE KIT Use to check blood sugar 1 time per day  . calcitRIOL (ROCALTROL) 0.25 MCG capsule TAKE 1 CAPSULE(0.25 MCG) BY MOUTH DAILY  . calcitRIOL (ROCALTROL) 0.25 MCG capsule TAKE 1 CAPSULE(0.25 MCG) BY MOUTH DAILY  . diltiazem (TIAZAC) 240 MG 24 hr capsule Take 1 capsule (240 mg total) by mouth daily.  Marland Kitchen donepezil (ARICEPT) 10 MG tablet TAKE 1 TABLET(10 MG) BY MOUTH AT BEDTIME  . ergocalciferol (VITAMIN D2) 50000 units capsule Take 1 capsule  (50,000 Units total) by mouth 3 (three) times a week.  . esomeprazole (NEXIUM) 20 MG capsule Take 1 capsule (20 mg total) by mouth daily.  Marland Kitchen glucose blood (ONE TOUCH ULTRA TEST) test strip Use to check blood sugar 1 time per day  . levothyroxine (SYNTHROID, LEVOTHROID) 137 MCG tablet Take 1 tablet (137 mcg total) by mouth daily before breakfast.  . losartan (COZAAR) 100 MG tablet Take 1 tablet (100 mg total) by mouth daily.  . magnesium oxide (MAG-OX) 400 MG tablet TAKE 2 TABLETS( 800 MG TOTAL) BY MOUTH DAILY  . metFORMIN (GLUCOPHAGE-XR) 500 MG 24 hr tablet Take 1 tablet (500 mg total) by mouth daily with breakfast.  . mirtazapine (REMERON) 30 MG tablet Take 1 tablet (30 mg total) by mouth at bedtime.  . mycophenolate (CELLCEPT) 250 MG capsule TAKE ONE CAPSULE BY MOUTH TWICE DAILY  . nystatin (MYCOSTATIN) powder Apply 1 g topically 4 (four) times daily as needed. For yeast under breast  . ondansetron (ZOFRAN ODT) 4 MG disintegrating tablet 87m ODT q4 hours prn nausea/vomiting  . ONETOUCH DELICA LANCETS 369VMISC Use to check blood sugar 1 time per day  . oxybutynin (DITROPAN) 5 MG tablet Take 5 mg by mouth daily as needed for bladder spasms.   . Potassium Chloride ER 20 MEQ TBCR Take 20 mEq by mouth daily.  . predniSONE (DELTASONE) 5 MG tablet TAKE 1 TABLET(5 MG) BY MOUTH DAILY  . QUEtiapine (SEROQUEL) 25 MG tablet Take 1 tablet (25 mg  total) by mouth at bedtime.  . sertraline (ZOLOFT) 100 MG tablet Take 0.5 tablets (50 mg total) by mouth daily.  . tacrolimus (PROGRAF) 1 MG capsule TAKE 2 CAPSULES (2MG) BY MOUTH IN MORNING. TAKE 1 CAPSULE (1MG) IN EVENING.  . traMADol (ULTRAM) 50 MG tablet Take 1 tablet (50 mg total) by mouth every 8 (eight) hours as needed.  . warfarin (COUMADIN) 5 MG tablet Take 1/2 tablet (2.5 mg) by mouth on Tuesday, Thursday and Sunday,t take 1 tablet (5 mg) all other days     Allergies  Allergen Reactions  . Oxycodone-Acetaminophen Shortness Of Breath and Nausea Only  .  Propoxyphene Nausea Only and Shortness Of Breath    States takes tylenol at home  . Propoxyphene N-Acetaminophen Shortness Of Breath and Nausea Only  . Sulfonamide Derivatives Shortness Of Breath and Nausea Only  . Codeine Nausea Only  . Hydrocodone-Acetaminophen   . Hydromorphone   . Other Other (See Comments)    No blood, Jehovaeh Witness   . Oxycodone   . Sulfamethoxazole   . Tape     Redness**PAPER TAPE OK**  . Gabapentin Anxiety    twitching  . Latex Rash  . Metoprolol Rash  . Morphine And Related Rash    IV site on arm is red, patient reports this is improving.  NO shortness of breath reported.  . Rosiglitazone Rash    Social History   Socioeconomic History  . Marital status: Divorced    Spouse name: Not on file  . Number of children: 1  . Years of education: Not on file  . Highest education level: Not on file  Occupational History  . Occupation: DISABILITY    Employer: UNEMPLOYED  Social Needs  . Financial resource strain: Not on file  . Food insecurity:    Worry: Not on file    Inability: Not on file  . Transportation needs:    Medical: Not on file    Non-medical: Not on file  Tobacco Use  . Smoking status: Never Smoker  . Smokeless tobacco: Never Used  Substance and Sexual Activity  . Alcohol use: No    Alcohol/week: 0.0 standard drinks  . Drug use: No  . Sexual activity: Not Currently    Partners: Male    Birth control/protection: Post-menopausal  Lifestyle  . Physical activity:    Days per week: Not on file    Minutes per session: Not on file  . Stress: Not on file  Relationships  . Social connections:    Talks on phone: Not on file    Gets together: Not on file    Attends religious service: Not on file    Active member of club or organization: Not on file    Attends meetings of clubs or organizations: Not on file    Relationship status: Not on file  . Intimate partner violence:    Fear of current or ex partner: Not on file    Emotionally  abused: Not on file    Physically abused: Not on file    Forced sexual activity: Not on file  Other Topics Concern  . Not on file  Social History Narrative   Retired   Regular exercise-no   Pt completed hs.      Review of Systems: General: negative for chills, fever, night sweats or weight changes.  Cardiovascular: negative for chest pain, dyspnea on exertion, edema, orthopnea, palpitations, paroxysmal nocturnal dyspnea or shortness of breath Dermatological: negative for rash Respiratory: negative for cough  or wheezing Urologic: negative for hematuria Abdominal: negative for nausea, vomiting, diarrhea, bright red blood per rectum, melena, or hematemesis Neurologic: negative for visual changes, syncope, or dizziness All other systems reviewed and are otherwise negative except as noted above.    Blood pressure 130/90, pulse (!) 105, height '5\' 4"'  (1.626 m), weight 175 lb 6.4 oz (79.6 kg).  General appearance: alert and no distress Neck: no adenopathy, no carotid bruit, no JVD, supple, symmetrical, trachea midline and thyroid not enlarged, symmetric, no tenderness/mass/nodules Lungs: clear to auscultation bilaterally Heart: Mildly tachycardic without murmurs or rubs Extremities: extremities normal, atraumatic, no cyanosis or edema Pulses: 2+ and symmetric Skin: Skin color, texture, turgor normal. No rashes or lesions Neurologic: Alert and oriented X 3, normal strength and tone. Normal symmetric reflexes. Normal coordination and gait  EKG sinus tachycardia 105 with left anterior fascicular block and borderline criteria for LVH.  I personally reviewed this EKG.  ASSESSMENT AND PLAN:   Hyperlipidemia History of hyperlipidemia on statin therapy with lipid profile performed 06/27/2017 revealing total cholesterol 166, LDL 84 and HDL of 81.  Sinus tachycardia Chronic      Lorretta Harp MD [facility], [facility] 03/10/2018 11:27 AM

## 2018-03-15 DIAGNOSIS — E1165 Type 2 diabetes mellitus with hyperglycemia: Secondary | ICD-10-CM | POA: Diagnosis not present

## 2018-03-15 DIAGNOSIS — I6789 Other cerebrovascular disease: Secondary | ICD-10-CM | POA: Diagnosis not present

## 2018-03-24 ENCOUNTER — Encounter (INDEPENDENT_AMBULATORY_CARE_PROVIDER_SITE_OTHER): Payer: Self-pay

## 2018-03-24 ENCOUNTER — Ambulatory Visit (INDEPENDENT_AMBULATORY_CARE_PROVIDER_SITE_OTHER): Payer: Medicare Other | Admitting: Pharmacist

## 2018-03-24 DIAGNOSIS — I2699 Other pulmonary embolism without acute cor pulmonale: Secondary | ICD-10-CM | POA: Diagnosis not present

## 2018-03-24 DIAGNOSIS — I6350 Cerebral infarction due to unspecified occlusion or stenosis of unspecified cerebral artery: Secondary | ICD-10-CM

## 2018-03-24 DIAGNOSIS — Z7901 Long term (current) use of anticoagulants: Secondary | ICD-10-CM | POA: Diagnosis not present

## 2018-03-24 LAB — POCT INR: INR: 1 — AB (ref 2.0–3.0)

## 2018-03-24 NOTE — Patient Instructions (Signed)
Description   Take 1.5 tablets for 3 days, then continue taking 1/2 tablet every day except 1 tablet on Monday, Wednesday and Friday.  Recheck INR in 1 week. Call coumadin clinic with any questions  [phone]

## 2018-03-31 ENCOUNTER — Ambulatory Visit (INDEPENDENT_AMBULATORY_CARE_PROVIDER_SITE_OTHER): Payer: Medicare Other | Admitting: *Deleted

## 2018-03-31 DIAGNOSIS — I6350 Cerebral infarction due to unspecified occlusion or stenosis of unspecified cerebral artery: Secondary | ICD-10-CM | POA: Diagnosis not present

## 2018-03-31 DIAGNOSIS — Z7901 Long term (current) use of anticoagulants: Secondary | ICD-10-CM | POA: Diagnosis not present

## 2018-03-31 DIAGNOSIS — I2699 Other pulmonary embolism without acute cor pulmonale: Secondary | ICD-10-CM

## 2018-03-31 LAB — POCT INR: INR: 1.9 — AB (ref 2.0–3.0)

## 2018-03-31 NOTE — Patient Instructions (Signed)
Description   Take 1.5 tablets today then continue taking 1/2 tablet on Monday, Wednesday, and Fridays. Recheck INR in 2 weeks.  Call coumadin clinic with any questions  [phone]

## 2018-04-02 ENCOUNTER — Other Ambulatory Visit: Payer: Self-pay | Admitting: Cardiovascular Disease

## 2018-04-02 ENCOUNTER — Other Ambulatory Visit: Payer: Self-pay | Admitting: Endocrinology

## 2018-04-13 ENCOUNTER — Ambulatory Visit (INDEPENDENT_AMBULATORY_CARE_PROVIDER_SITE_OTHER): Payer: Medicare Other

## 2018-04-13 DIAGNOSIS — I2699 Other pulmonary embolism without acute cor pulmonale: Secondary | ICD-10-CM

## 2018-04-13 DIAGNOSIS — Z7901 Long term (current) use of anticoagulants: Secondary | ICD-10-CM | POA: Diagnosis not present

## 2018-04-13 DIAGNOSIS — I6350 Cerebral infarction due to unspecified occlusion or stenosis of unspecified cerebral artery: Secondary | ICD-10-CM

## 2018-04-13 LAB — POCT INR: INR: 2.2 (ref 2.0–3.0)

## 2018-04-13 NOTE — Progress Notes (Signed)
NEUROLOGY FOLLOW UP OFFICE NOTE  Connie Ruiz [id number]  HISTORY OF PRESENT ILLNESS: Connie Ruiz is a 57 year old left-handed woman with antiphospholipid syndrome (on Coumadin), hypertension, type 2 diabetes mellitus, left thyroid nodule, hyperlipidemia, CHF, renal transplant on immunosuppressants, lupus, osteoporosis, and history of stroke, DVT, and pulmonary embolism who follows up for vascular dementia.  She is accompanied by her daughter and caregiver, who supplement history.  UPDATE:  She is taking Aricept 10 mg daily.  No significant changes since last visit.  She is still living in her own apartment.  Her caregiver comes to the apartment daily to help with laundry, cleaning, and assisting with ADLs.  She manages her own medications via pillbox.  HISTORY:  She has history of significant cerebrovascular disease and stroke.  For several years, she has had memory problems.  For example, she would often forget having just seen her children from time to time.  She also expressed that she feels people do not care about her.  She does not drive.  Since her stroke, she requires assistance in regards to her ADLs.  Home health aide helps her with dressing and bathing.  She has a pillbox.  Her daughter handles her finances.  She is followed for her lupus by rheumatology at [facility].  Prior medications:  Seroquel (effective for agitation), mirtazapine  MRI of the head from 06/14/13 showed remote left MCA infarct but no acute findings.  MRA of the head showed atherosclerotic changes in the anterior and posterior circulation with partial recanalization of the left MCA.  PAST MEDICAL HISTORY: Past Medical History:  Diagnosis Date  . Anxiety   . Candida esophagitis ([place]) 11/12/2014  . CEREBROVASCULAR ACCIDENT, ACUTE 04/15/2010  . CLOSTRIDIUM DIFFICILE COLITIS, HX OF 08/21/2007  . CONGESTIVE HEART FAILURE 08/21/2007  . Current use of long term anticoagulation    Dr. Andree Elk, [facility]  . CVA  04/17/2010  . Depression    Dr. Andree Elk, [place], TYPE II 08/21/2007  . DVT, HX OF 08/21/2007  . GERD 08/21/2007  . GOUT 08/21/2007  . History of stroke with residual effects   . HYPERLIPIDEMIA 08/21/2007  . HYPERTENSION 08/21/2007   Dr. Andree Elk, [place], HX OF 08/22/2007   s/p renal transplant-Dr. Andree Elk, [place] 08/21/2007  . OSTEOPOROSIS 08/21/2007   Rheumatol at [facility]  . Pulmonary embolism ([place]) 07/16/2010  . Renal failure   . RENAL INSUFFICIENCY 08/21/2007  . Right sided weakness   . Steroid-induced hyperglycemia 11/09/2014  . Tachycardia   . THYROID NODULE, LEFT 04/10/2009    MEDICATIONS: Current Outpatient Medications on File Prior to Visit  Medication Sig Dispense Refill  . alendronate (FOSAMAX) 70 MG tablet TAKE 1 TABLET BY MOUTH ONCE A WEEK ON AN EMPTY STOMACH ON SATURDAYS WITH A FULL GLASS OF WATER. 12 tablet 0  . atorvastatin (LIPITOR) 40 MG tablet Take 1 tablet (40 mg total) by mouth daily.    . Blood Glucose Monitoring Suppl (ONETOUCH VERIO IQ SYSTEM) W/DEVICE KIT Use to check blood sugar 1 time per day 1 kit 0  . calcitRIOL (ROCALTROL) 0.25 MCG capsule TAKE 1 CAPSULE(0.25 MCG) BY MOUTH DAILY 30 capsule 0  . calcitRIOL (ROCALTROL) 0.25 MCG capsule TAKE 1 CAPSULE(0.25 MCG) BY MOUTH DAILY 30 capsule 0  . diltiazem (TIAZAC) 240 MG 24 hr capsule Take 1 capsule (240 mg total) by mouth daily. 30 capsule 11  . donepezil (ARICEPT) 10 MG tablet TAKE 1 TABLET(10 MG) BY MOUTH AT BEDTIME    .  esomeprazole (NEXIUM) 20 MG capsule Take 1 capsule (20 mg total) by mouth daily. 30 capsule 11  . glucose blood (ONE TOUCH ULTRA TEST) test strip Use to check blood sugar 1 time per day 100 each 2  . levothyroxine (SYNTHROID, LEVOTHROID) 137 MCG tablet Take 1 tablet (137 mcg total) by mouth daily before breakfast. 90 tablet 3  . losartan (COZAAR) 100 MG tablet Take 1 tablet (100 mg total) by mouth daily. 30 tablet 5  . magnesium oxide (MAG-OX) 400 MG  tablet TAKE 2 TABLETS( 800 MG TOTAL) BY MOUTH DAILY    . metFORMIN (GLUCOPHAGE-XR) 500 MG 24 hr tablet Take 1 tablet (500 mg total) by mouth daily with breakfast. 90 tablet 3  . mirtazapine (REMERON) 30 MG tablet Take 1 tablet (30 mg total) by mouth at bedtime. 30 tablet 1  . mycophenolate (CELLCEPT) 250 MG capsule TAKE ONE CAPSULE BY MOUTH TWICE DAILY    . nystatin (MYCOSTATIN) powder Apply 1 g topically 4 (four) times daily as needed. For yeast under breast    . ondansetron (ZOFRAN ODT) 4 MG disintegrating tablet 28m ODT q4 hours prn nausea/vomiting 15 tablet 0  . ONETOUCH DELICA LANCETS 354YMISC Use to check blood sugar 1 time per day 100 each 2  . oxybutynin (DITROPAN) 5 MG tablet Take 5 mg by mouth daily as needed for bladder spasms.     . Potassium Chloride ER 20 MEQ TBCR Take 20 mEq by mouth daily. 90 tablet 3  . predniSONE (DELTASONE) 5 MG tablet TAKE 1 TABLET(5 MG) BY MOUTH DAILY    . QUEtiapine (SEROQUEL) 25 MG tablet Take 1 tablet (25 mg total) by mouth at bedtime. 30 tablet 1  . sertraline (ZOLOFT) 100 MG tablet Take 0.5 tablets (50 mg total) by mouth daily. 15 tablet 2  . tacrolimus (PROGRAF) 1 MG capsule TAKE 2 CAPSULES (2MG) BY MOUTH IN MORNING. TAKE 1 CAPSULE (1MG) IN EVENING.    . traMADol (ULTRAM) 50 MG tablet Take 1 tablet (50 mg total) by mouth every 8 (eight) hours as needed. 30 tablet 0  . warfarin (COUMADIN) 5 MG tablet Take 1/2 tablet (2.5 mg) by mouth on Tuesday, Thursday and Sunday,t take 1 tablet (5 mg) all other days    . warfarin (COUMADIN) 5 MG tablet TAKE 1/2 TO 1 TABLET BY MOUTH DAILY AS DIRECTED BY COUMADIN CLINIC 30 tablet 1   No current facility-administered medications on file prior to visit.     ALLERGIES: Allergies  Allergen Reactions  . Oxycodone-Acetaminophen Shortness Of Breath and Nausea Only  . Propoxyphene Nausea Only and Shortness Of Breath    States takes tylenol at home  . Propoxyphene N-Acetaminophen Shortness Of Breath and Nausea Only  .  Sulfonamide Derivatives Shortness Of Breath and Nausea Only  . Codeine Nausea Only  . Hydrocodone-Acetaminophen   . Hydromorphone   . Other Other (See Comments)    No blood, Jehovaeh Witness   . Oxycodone   . Sulfamethoxazole   . Tape     Redness**PAPER TAPE OK**  . Gabapentin Anxiety    twitching  . Latex Rash  . Metoprolol Rash  . Morphine And Related Rash    IV site on arm is red, patient reports this is improving.  NO shortness of breath reported.  . Rosiglitazone Rash    FAMILY HISTORY: Family History  Problem Relation Age of Onset  . Heart attack Mother   . Heart disease Father   . Asthma Sister   . Asthma  Sister   . Asthma Daughter   . Cancer Maternal Grandfather        prostate  . Cancer Paternal Grandfather        colon   SOCIAL HISTORY: Social History   Socioeconomic History  . Marital status: Divorced    Spouse name: Not on file  . Number of children: 1  . Years of education: Not on file  . Highest education level: Not on file  Occupational History  . Occupation: DISABILITY    Employer: UNEMPLOYED  Social Needs  . Financial resource strain: Not on file  . Food insecurity:    Worry: Not on file    Inability: Not on file  . Transportation needs:    Medical: Not on file    Non-medical: Not on file  Tobacco Use  . Smoking status: Never Smoker  . Smokeless tobacco: Never Used  Substance and Sexual Activity  . Alcohol use: No    Alcohol/week: 0.0 standard drinks  . Drug use: No  . Sexual activity: Not Currently    Partners: Male    Birth control/protection: Post-menopausal  Lifestyle  . Physical activity:    Days per week: Not on file    Minutes per session: Not on file  . Stress: Not on file  Relationships  . Social connections:    Talks on phone: Not on file    Gets together: Not on file    Attends religious service: Not on file    Active member of club or organization: Not on file    Attends meetings of clubs or organizations: Not on  file    Relationship status: Not on file  . Intimate partner violence:    Fear of current or ex partner: Not on file    Emotionally abused: Not on file    Physically abused: Not on file    Forced sexual activity: Not on file  Other Topics Concern  . Not on file  Social History Narrative   Retired   Regular exercise-no   Pt completed hs.     REVIEW OF SYSTEMS: Constitutional: No fevers, chills, or sweats, no generalized fatigue, change in appetite Eyes: No visual changes, double vision, eye pain Ear, nose and throat: No hearing loss, ear pain, nasal congestion, sore throat Cardiovascular: No chest pain, palpitations Respiratory:  No shortness of breath at rest or with exertion, wheezes GastrointestinaI: No nausea, vomiting, diarrhea, abdominal pain, fecal incontinence Genitourinary:  No dysuria, urinary retention or frequency Musculoskeletal:  No neck pain, back pain Integumentary: No rash, pruritus, skin lesions Neurological: as above Psychiatric: No depression, insomnia, anxiety Endocrine: No palpitations, fatigue, diaphoresis, mood swings, change in appetite, change in weight, increased thirst Hematologic/Lymphatic:  No purpura, petechiae. Allergic/Immunologic: no itchy/runny eyes, nasal congestion, recent allergic reactions, rashes  PHYSICAL EXAM: Blood pressure 112/68, pulse (!) 115, height '5\' 4"'  (1.626 m), weight 178 lb (80.7 kg), SpO2 94 %. General: No acute distress.  Patient appears well-groomed.   Head:  Normocephalic/atraumatic Eyes:  Fundi examined but not visualized Neck: supple, no paraspinal tenderness, full range of motion Heart:  Regular rate and rhythm Lungs:  Clear to auscultation bilaterally Back: No paraspinal tenderness Neurological Exam: Alert and oriented to person, place, and time.  Attention span and concentration reduced, delayed recall fair; remote memory intact; fund of knowledge intact.  Speech and language aphasic with some hesitancy in speech  output and naming fluency but able to follow commands.  Speech is not dysarthric.   MMSE -  Mini Mental State Exam 04/14/2018  Orientation to time 4  Orientation to Place 5  Registration 3  Attention/ Calculation 0  Recall 2  Language- name 2 objects 2  Language- repeat 1  Language- follow 3 step command 3  Language- read & follow direction 1  Write a sentence 0  Copy design 0  Total score 21   Right ptosis, reduced facial sensation on right V1-V3 and mild right lower facial weakness.  Otherwise, CN II-XII intact.  Bulk and tone normal.  Muscle strength 5-/5 right upper extremity and hip flexion.  Otherwise 5/5.  Deep tendon reflexes 2+ throughout, toes downgoing.  Tingling nose testing intact.  Wide-based wobbling gait.  Romberg negative.  IMPRESSION: 1.  Vascular dementia 2.  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  Type 2 diabetes mellitus 4.  Essential hypertension 5.  Hyperlipidemia 6.  Depression  PLAN: 1.  Continue Aricept 10 mg at bedtime. 2.  Continue anticoagulation 3.  Continue Lipitor 40 mg (LDL goal less than 70) 4.  Blood pressure and glycemic control. 5.  Advised that she should not drive. 6.  Follow-up in 1 year  Metta Clines, DO  CC: Renato Shin, MD

## 2018-04-13 NOTE — Patient Instructions (Signed)
Description   Continue on same dosage 1/2 tablet daily except 1 tablet on Monday, Wednesday, and Fridays. Recheck INR in 3 weeks.  Call coumadin clinic with any questions  [phone]

## 2018-04-14 ENCOUNTER — Encounter: Payer: Self-pay | Admitting: Neurology

## 2018-04-14 ENCOUNTER — Ambulatory Visit (INDEPENDENT_AMBULATORY_CARE_PROVIDER_SITE_OTHER): Payer: Medicare Other | Admitting: Neurology

## 2018-04-14 VITALS — BP 112/68 | HR 115 | Ht 64.0 in | Wt 178.0 lb

## 2018-04-14 DIAGNOSIS — I69359 Hemiplegia and hemiparesis following cerebral infarction affecting unspecified side: Secondary | ICD-10-CM | POA: Diagnosis not present

## 2018-04-14 DIAGNOSIS — E1122 Type 2 diabetes mellitus with diabetic chronic kidney disease: Secondary | ICD-10-CM

## 2018-04-14 DIAGNOSIS — I10 Essential (primary) hypertension: Secondary | ICD-10-CM

## 2018-04-14 DIAGNOSIS — I69320 Aphasia following cerebral infarction: Secondary | ICD-10-CM

## 2018-04-14 DIAGNOSIS — E785 Hyperlipidemia, unspecified: Secondary | ICD-10-CM | POA: Diagnosis not present

## 2018-04-14 DIAGNOSIS — Z794 Long term (current) use of insulin: Secondary | ICD-10-CM

## 2018-04-14 DIAGNOSIS — F0151 Vascular dementia with behavioral disturbance: Secondary | ICD-10-CM

## 2018-04-14 DIAGNOSIS — F01518 Vascular dementia, unspecified severity, with other behavioral disturbance: Secondary | ICD-10-CM

## 2018-04-14 NOTE — Patient Instructions (Signed)
1.  Continue warfarin for secondary stroke prevention 2.  Continue atorvastatin for cholesterol 3.  Continue medications for blood pressure and diabetes 4.  Continue donepezil 10mg  daily for memory 5.  I advise that you do not drive. 6.  Follow up in one year

## 2018-04-15 DIAGNOSIS — E1165 Type 2 diabetes mellitus with hyperglycemia: Secondary | ICD-10-CM | POA: Diagnosis not present

## 2018-04-15 DIAGNOSIS — I6789 Other cerebrovascular disease: Secondary | ICD-10-CM | POA: Diagnosis not present

## 2018-04-25 ENCOUNTER — Ambulatory Visit (INDEPENDENT_AMBULATORY_CARE_PROVIDER_SITE_OTHER): Payer: Medicare Other | Admitting: Podiatry

## 2018-04-25 ENCOUNTER — Encounter: Payer: Self-pay | Admitting: Podiatry

## 2018-04-25 DIAGNOSIS — M79609 Pain in unspecified limb: Secondary | ICD-10-CM | POA: Diagnosis not present

## 2018-04-25 DIAGNOSIS — B351 Tinea unguium: Secondary | ICD-10-CM

## 2018-04-25 DIAGNOSIS — E1159 Type 2 diabetes mellitus with other circulatory complications: Secondary | ICD-10-CM

## 2018-05-03 ENCOUNTER — Ambulatory Visit (INDEPENDENT_AMBULATORY_CARE_PROVIDER_SITE_OTHER): Payer: Medicare Other | Admitting: *Deleted

## 2018-05-03 DIAGNOSIS — I2699 Other pulmonary embolism without acute cor pulmonale: Secondary | ICD-10-CM | POA: Diagnosis not present

## 2018-05-03 DIAGNOSIS — Z7901 Long term (current) use of anticoagulants: Secondary | ICD-10-CM | POA: Diagnosis not present

## 2018-05-03 DIAGNOSIS — I6350 Cerebral infarction due to unspecified occlusion or stenosis of unspecified cerebral artery: Secondary | ICD-10-CM

## 2018-05-03 LAB — POCT INR: INR: 3.6 — AB (ref 2.0–3.0)

## 2018-05-03 NOTE — Patient Instructions (Signed)
Description   Skip today's dose, then start taking 1/2 tablet daily except 1 tablet on Monday, Wednesday, and Fridays. Recheck INR in 2 weeks.  Call coumadin clinic with any questions  [phone]

## 2018-05-15 DIAGNOSIS — E1165 Type 2 diabetes mellitus with hyperglycemia: Secondary | ICD-10-CM | POA: Diagnosis not present

## 2018-05-15 DIAGNOSIS — I6789 Other cerebrovascular disease: Secondary | ICD-10-CM | POA: Diagnosis not present

## 2018-05-17 ENCOUNTER — Ambulatory Visit (INDEPENDENT_AMBULATORY_CARE_PROVIDER_SITE_OTHER): Payer: Medicare Other | Admitting: *Deleted

## 2018-05-17 DIAGNOSIS — I2699 Other pulmonary embolism without acute cor pulmonale: Secondary | ICD-10-CM | POA: Diagnosis not present

## 2018-05-17 DIAGNOSIS — Z7901 Long term (current) use of anticoagulants: Secondary | ICD-10-CM | POA: Diagnosis not present

## 2018-05-17 DIAGNOSIS — I6350 Cerebral infarction due to unspecified occlusion or stenosis of unspecified cerebral artery: Secondary | ICD-10-CM

## 2018-05-17 LAB — POCT INR: INR: 5 — AB (ref 2.0–3.0)

## 2018-05-17 NOTE — Patient Instructions (Signed)
Description   Skip today's dose and tomorrow's dose, then start taking 1/2 tablet daily except 1 tablet on Monday  and Fridays. Recheck INR in 9 days.  Call coumadin clinic with any questions  [phone]

## 2018-05-18 ENCOUNTER — Other Ambulatory Visit: Payer: Self-pay

## 2018-05-18 NOTE — Patient Outreach (Signed)
[facility]) Care Management  05/18/2018  CORTLYN CANNELL [birth year] [id number]   Medication Adherence call to Mrs. Blanca Friend spoke with patient she is due on Metformin ER 500 mg patient said she already pick then up from [facility] on November/2019 for a 90 days supply. Mrs. Packard is showing past due under [place].   [place] Management Direct Dial [phone]  Fax [phone] [email or web address]

## 2018-05-19 ENCOUNTER — Other Ambulatory Visit: Payer: Self-pay | Admitting: Endocrinology

## 2018-05-24 ENCOUNTER — Other Ambulatory Visit: Payer: Self-pay | Admitting: Endocrinology

## 2018-05-26 ENCOUNTER — Ambulatory Visit (INDEPENDENT_AMBULATORY_CARE_PROVIDER_SITE_OTHER): Payer: Medicare Other

## 2018-05-26 DIAGNOSIS — I6350 Cerebral infarction due to unspecified occlusion or stenosis of unspecified cerebral artery: Secondary | ICD-10-CM | POA: Diagnosis not present

## 2018-05-26 DIAGNOSIS — I2699 Other pulmonary embolism without acute cor pulmonale: Secondary | ICD-10-CM

## 2018-05-26 DIAGNOSIS — Z7901 Long term (current) use of anticoagulants: Secondary | ICD-10-CM | POA: Diagnosis not present

## 2018-05-26 LAB — POCT INR: INR: 3.3 — AB (ref 2.0–3.0)

## 2018-05-26 NOTE — Patient Instructions (Signed)
Description   Skip today's dosage of Coumadin, then resume same dosage 1/2 tablet daily except 1 tablet on Mondays and Fridays. Recheck INR in 2 weeks.  Call coumadin clinic with any questions  [phone]

## 2018-06-08 ENCOUNTER — Ambulatory Visit (INDEPENDENT_AMBULATORY_CARE_PROVIDER_SITE_OTHER): Payer: Medicare Other | Admitting: Pharmacist

## 2018-06-08 DIAGNOSIS — Z7901 Long term (current) use of anticoagulants: Secondary | ICD-10-CM | POA: Diagnosis not present

## 2018-06-08 DIAGNOSIS — I2699 Other pulmonary embolism without acute cor pulmonale: Secondary | ICD-10-CM | POA: Diagnosis not present

## 2018-06-08 DIAGNOSIS — I6350 Cerebral infarction due to unspecified occlusion or stenosis of unspecified cerebral artery: Secondary | ICD-10-CM | POA: Diagnosis not present

## 2018-06-08 LAB — POCT INR: INR: 2.9 (ref 2.0–3.0)

## 2018-06-08 NOTE — Patient Instructions (Signed)
Description   Continue same dosage 1/2 tablet daily except 1 tablet on Mondays and Fridays. Recheck INR in 3 weeks.  Call coumadin clinic with any questions  [phone]

## 2018-06-15 DIAGNOSIS — I6789 Other cerebrovascular disease: Secondary | ICD-10-CM | POA: Diagnosis not present

## 2018-06-15 DIAGNOSIS — E1165 Type 2 diabetes mellitus with hyperglycemia: Secondary | ICD-10-CM | POA: Diagnosis not present

## 2018-06-20 ENCOUNTER — Other Ambulatory Visit: Payer: Self-pay | Admitting: Endocrinology

## 2018-06-20 ENCOUNTER — Other Ambulatory Visit: Payer: Self-pay | Admitting: Cardiovascular Disease

## 2018-06-29 ENCOUNTER — Ambulatory Visit (INDEPENDENT_AMBULATORY_CARE_PROVIDER_SITE_OTHER): Payer: Medicare Other | Admitting: Pharmacist

## 2018-06-29 DIAGNOSIS — I6350 Cerebral infarction due to unspecified occlusion or stenosis of unspecified cerebral artery: Secondary | ICD-10-CM | POA: Diagnosis not present

## 2018-06-29 DIAGNOSIS — I2699 Other pulmonary embolism without acute cor pulmonale: Secondary | ICD-10-CM | POA: Diagnosis not present

## 2018-06-29 LAB — POCT INR: INR: 3.2 — AB (ref 2.0–3.0)

## 2018-06-29 NOTE — Patient Instructions (Signed)
Description   Skip your warfarin dose today, then continue same dosage 1/2 tablet daily except 1 tablet on Mondays and Fridays. Recheck INR in 3 weeks.  Call coumadin clinic with any questions  [phone]

## 2018-07-17 ENCOUNTER — Other Ambulatory Visit: Payer: Self-pay | Admitting: Endocrinology

## 2018-07-20 ENCOUNTER — Ambulatory Visit (INDEPENDENT_AMBULATORY_CARE_PROVIDER_SITE_OTHER): Payer: Medicare Other

## 2018-07-20 ENCOUNTER — Other Ambulatory Visit: Payer: Self-pay | Admitting: Endocrinology

## 2018-07-20 DIAGNOSIS — I2699 Other pulmonary embolism without acute cor pulmonale: Secondary | ICD-10-CM

## 2018-07-20 DIAGNOSIS — Z7901 Long term (current) use of anticoagulants: Secondary | ICD-10-CM

## 2018-07-20 DIAGNOSIS — I6350 Cerebral infarction due to unspecified occlusion or stenosis of unspecified cerebral artery: Secondary | ICD-10-CM | POA: Diagnosis not present

## 2018-07-20 LAB — POCT INR: INR: 2 (ref 2.0–3.0)

## 2018-07-20 NOTE — Telephone Encounter (Signed)
Please refill x 3 months Further refills would have to be considered by new PCP   

## 2018-07-20 NOTE — Patient Instructions (Signed)
Description   Continue same dosage 1/2 tablet daily except 1 tablet on Mondays and Fridays. Recheck INR in 4 weeks.  Call coumadin clinic with any questions  [phone]

## 2018-07-26 ENCOUNTER — Ambulatory Visit: Payer: Medicare Other | Admitting: Podiatry

## 2018-08-14 ENCOUNTER — Inpatient Hospital Stay (HOSPITAL_COMMUNITY)
Admission: EM | Admit: 2018-08-14 | Discharge: 2018-08-22 | DRG: 872 | Disposition: A | Discharge status: Skilled Nursing Facility | Payer: Medicare Other | Attending: Internal Medicine | Admitting: Internal Medicine

## 2018-08-14 ENCOUNTER — Other Ambulatory Visit: Payer: Self-pay

## 2018-08-14 ENCOUNTER — Emergency Department (HOSPITAL_COMMUNITY): Payer: Medicare Other

## 2018-08-14 ENCOUNTER — Encounter (HOSPITAL_COMMUNITY): Payer: Self-pay

## 2018-08-14 ENCOUNTER — Emergency Department (HOSPITAL_BASED_OUTPATIENT_CLINIC_OR_DEPARTMENT_OTHER)
Admission: EM | Admit: 2018-08-14 | Discharge: 2018-08-14 | Disposition: A | Discharge status: Home / Self Care | Payer: Medicare Other | Source: Home / Self Care

## 2018-08-14 DIAGNOSIS — Z86711 Personal history of pulmonary embolism: Secondary | ICD-10-CM

## 2018-08-14 DIAGNOSIS — R000 Tachycardia, unspecified: Secondary | ICD-10-CM

## 2018-08-14 DIAGNOSIS — N186 End stage renal disease: Secondary | ICD-10-CM | POA: Diagnosis not present

## 2018-08-14 DIAGNOSIS — Z79890 Hormone replacement therapy (postmenopausal): Secondary | ICD-10-CM

## 2018-08-14 DIAGNOSIS — Z8673 Personal history of transient ischemic attack (TIA), and cerebral infarction without residual deficits: Secondary | ICD-10-CM | POA: Diagnosis not present

## 2018-08-14 DIAGNOSIS — E876 Hypokalemia: Secondary | ICD-10-CM | POA: Diagnosis present

## 2018-08-14 DIAGNOSIS — Z992 Dependence on renal dialysis: Secondary | ICD-10-CM | POA: Diagnosis not present

## 2018-08-14 DIAGNOSIS — E039 Hypothyroidism, unspecified: Secondary | ICD-10-CM | POA: Diagnosis not present

## 2018-08-14 DIAGNOSIS — M329 Systemic lupus erythematosus, unspecified: Secondary | ICD-10-CM | POA: Diagnosis present

## 2018-08-14 DIAGNOSIS — K219 Gastro-esophageal reflux disease without esophagitis: Secondary | ICD-10-CM | POA: Diagnosis not present

## 2018-08-14 DIAGNOSIS — I110 Hypertensive heart disease with heart failure: Secondary | ICD-10-CM | POA: Diagnosis present

## 2018-08-14 DIAGNOSIS — R509 Fever, unspecified: Secondary | ICD-10-CM | POA: Diagnosis not present

## 2018-08-14 DIAGNOSIS — I10 Essential (primary) hypertension: Secondary | ICD-10-CM | POA: Diagnosis not present

## 2018-08-14 DIAGNOSIS — Z79899 Other long term (current) drug therapy: Secondary | ICD-10-CM | POA: Diagnosis not present

## 2018-08-14 DIAGNOSIS — M109 Gout, unspecified: Secondary | ICD-10-CM | POA: Diagnosis present

## 2018-08-14 DIAGNOSIS — E1122 Type 2 diabetes mellitus with diabetic chronic kidney disease: Secondary | ICD-10-CM

## 2018-08-14 DIAGNOSIS — B9789 Other viral agents as the cause of diseases classified elsewhere: Secondary | ICD-10-CM | POA: Diagnosis not present

## 2018-08-14 DIAGNOSIS — E785 Hyperlipidemia, unspecified: Secondary | ICD-10-CM | POA: Diagnosis present

## 2018-08-14 DIAGNOSIS — Z7952 Long term (current) use of systemic steroids: Secondary | ICD-10-CM | POA: Diagnosis not present

## 2018-08-14 DIAGNOSIS — K920 Hematemesis: Secondary | ICD-10-CM | POA: Diagnosis present

## 2018-08-14 DIAGNOSIS — M810 Age-related osteoporosis without current pathological fracture: Secondary | ICD-10-CM | POA: Diagnosis present

## 2018-08-14 DIAGNOSIS — J069 Acute upper respiratory infection, unspecified: Secondary | ICD-10-CM | POA: Diagnosis not present

## 2018-08-14 DIAGNOSIS — J00 Acute nasopharyngitis [common cold]: Secondary | ICD-10-CM | POA: Diagnosis present

## 2018-08-14 DIAGNOSIS — R05 Cough: Secondary | ICD-10-CM | POA: Diagnosis not present

## 2018-08-14 DIAGNOSIS — Z7901 Long term (current) use of anticoagulants: Secondary | ICD-10-CM | POA: Diagnosis not present

## 2018-08-14 DIAGNOSIS — E119 Type 2 diabetes mellitus without complications: Secondary | ICD-10-CM | POA: Diagnosis not present

## 2018-08-14 DIAGNOSIS — Z940 Kidney transplant status: Secondary | ICD-10-CM

## 2018-08-14 DIAGNOSIS — M25561 Pain in right knee: Secondary | ICD-10-CM | POA: Diagnosis not present

## 2018-08-14 DIAGNOSIS — I2699 Other pulmonary embolism without acute cor pulmonale: Secondary | ICD-10-CM | POA: Diagnosis not present

## 2018-08-14 DIAGNOSIS — K6389 Other specified diseases of intestine: Secondary | ICD-10-CM | POA: Diagnosis not present

## 2018-08-14 DIAGNOSIS — Z86718 Personal history of other venous thrombosis and embolism: Secondary | ICD-10-CM

## 2018-08-14 DIAGNOSIS — A419 Sepsis, unspecified organism: Secondary | ICD-10-CM | POA: Diagnosis not present

## 2018-08-14 DIAGNOSIS — I5032 Chronic diastolic (congestive) heart failure: Secondary | ICD-10-CM | POA: Diagnosis not present

## 2018-08-14 DIAGNOSIS — M7989 Other specified soft tissue disorders: Secondary | ICD-10-CM

## 2018-08-14 DIAGNOSIS — Z8249 Family history of ischemic heart disease and other diseases of the circulatory system: Secondary | ICD-10-CM

## 2018-08-14 LAB — CBC
HCT: 45.1 % (ref 36.0–46.0)
Hemoglobin: 13.6 g/dL (ref 12.0–15.0)
MCH: 28.2 pg (ref 26.0–34.0)
MCHC: 30.2 g/dL (ref 30.0–36.0)
MCV: 93.6 fL (ref 80.0–100.0)
Platelets: 252 10*3/uL (ref 150–400)
RBC: 4.82 MIL/uL (ref 3.87–5.11)
RDW: 14.7 % (ref 11.5–15.5)
WBC: 10.4 10*3/uL (ref 4.0–10.5)
nRBC: 0 % (ref 0.0–0.2)

## 2018-08-14 LAB — RESPIRATORY PANEL BY PCR

## 2018-08-14 LAB — COMPREHENSIVE METABOLIC PANEL
ALT: 9 U/L (ref 0–44)
AST: 16 U/L (ref 15–41)
Albumin: 3.6 g/dL (ref 3.5–5.0)
Alkaline Phosphatase: 76 U/L (ref 38–126)
Anion gap: 9 (ref 5–15)
BUN: 18 mg/dL (ref 6–20)
CO2: 25 mmol/L (ref 22–32)
Calcium: 8.9 mg/dL (ref 8.9–10.3)
Chloride: 106 mmol/L (ref 98–111)
Creatinine, Ser: 0.77 mg/dL (ref 0.44–1.00)
GFR calc Af Amer: >60 mL/min (ref >60)
GFR calc non Af Amer: >60 mL/min (ref >60)
Glucose, Bld: 135 mg/dL — ABNORMAL HIGH (ref 70–99)
Potassium: 3.2 mmol/L — ABNORMAL LOW (ref 3.5–5.1)
Sodium: 140 mmol/L (ref 135–145)
Total Bilirubin: 1.6 mg/dL — ABNORMAL HIGH (ref 0.3–1.2)
Total Protein: 6.9 g/dL (ref 6.5–8.1)

## 2018-08-14 LAB — LIPASE, BLOOD: Lipase: 30 U/L (ref 11–51)

## 2018-08-14 LAB — TSH: TSH: 64.113 u[IU]/mL — ABNORMAL HIGH (ref 0.350–4.500)

## 2018-08-14 LAB — LACTIC ACID, PLASMA: Lactic Acid, Venous: 1.3 mmol/L (ref 0.5–1.9)

## 2018-08-14 LAB — PROTIME-INR
INR: 1.2 (ref 0.8–1.2)
Prothrombin Time: 15.4 seconds — ABNORMAL HIGH (ref 11.4–15.2)

## 2018-08-14 LAB — MAGNESIUM: Magnesium: 1.6 mg/dL — ABNORMAL LOW (ref 1.7–2.4)

## 2018-08-14 MED ORDER — VANCOMYCIN HCL IN DEXTROSE 1-5 GM/200ML-% IV SOLN: 1000.0000 mg | Freq: Once | INTRAVENOUS | Status: DC

## 2018-08-14 MED ORDER — ONDANSETRON 4 MG PO TBDP
4.0000 mg | ORAL_TABLET | Freq: Once | ORAL | Status: AC | PRN
  Administered 2018-08-14: 4 mg via ORAL
  Filled 2018-08-14: qty 1

## 2018-08-14 MED ORDER — CALCITRIOL 0.25 MCG PO CAPS
0.2500 ug | ORAL_CAPSULE | ORAL | Status: DC
  Administered 2018-08-15 – 2018-08-21 (×4): 0.25 ug via ORAL
  Filled 2018-08-14 (×4): qty 1

## 2018-08-14 MED ORDER — SODIUM CHLORIDE 0.9 % IV SOLN
1.0000 g | Freq: Three times a day (TID) | INTRAVENOUS | Status: DC
  Administered 2018-08-15 – 2018-08-17 (×8): 1 g via INTRAVENOUS
  Filled 2018-08-14 (×8): qty 1

## 2018-08-14 MED ORDER — VANCOMYCIN HCL 10 G IV SOLR
1750.0000 mg | Freq: Once | INTRAVENOUS | Status: AC
  Administered 2018-08-14: 1750 mg via INTRAVENOUS
  Filled 2018-08-14: qty 1750

## 2018-08-14 MED ORDER — PANTOPRAZOLE SODIUM 40 MG PO TBEC
40.0000 mg | DELAYED_RELEASE_TABLET | Freq: Every day | ORAL | Status: DC
  Administered 2018-08-15: 40 mg via ORAL
  Filled 2018-08-14: qty 1

## 2018-08-14 MED ORDER — ENOXAPARIN SODIUM 40 MG/0.4ML ~~LOC~~ SOLN: 40.0000 mg | SUBCUTANEOUS | Status: DC

## 2018-08-14 MED ORDER — ACETAMINOPHEN 325 MG PO TABS
650.0000 mg | ORAL_TABLET | Freq: Once | ORAL | Status: AC
  Administered 2018-08-14: 650 mg via ORAL
  Filled 2018-08-14: qty 2

## 2018-08-14 MED ORDER — SODIUM CHLORIDE 0.9 % IV SOLN
2.0000 g | Freq: Once | INTRAVENOUS | Status: AC
  Administered 2018-08-14: 2 g via INTRAVENOUS
  Filled 2018-08-14: qty 2

## 2018-08-14 MED ORDER — INSULIN ASPART 100 UNIT/ML ~~LOC~~ SOLN
0.0000 [IU] | Freq: Three times a day (TID) | SUBCUTANEOUS | Status: DC
  Administered 2018-08-15 – 2018-08-18 (×5): 1 [IU] via SUBCUTANEOUS
  Administered 2018-08-19: 2 [IU] via SUBCUTANEOUS
  Administered 2018-08-19 – 2018-08-20 (×2): 3 [IU] via SUBCUTANEOUS
  Administered 2018-08-20 – 2018-08-22 (×4): 2 [IU] via SUBCUTANEOUS

## 2018-08-14 MED ORDER — VANCOMYCIN HCL 10 G IV SOLR: 1250.0000 mg | INTRAVENOUS | Status: DC

## 2018-08-14 MED ORDER — ACETAMINOPHEN 325 MG PO TABS
650.0000 mg | ORAL_TABLET | Freq: Once | ORAL | Status: AC | PRN
  Administered 2018-08-14: 650 mg via ORAL
  Filled 2018-08-14: qty 2

## 2018-08-14 MED ORDER — ATORVASTATIN CALCIUM 40 MG PO TABS
40.0000 mg | ORAL_TABLET | Freq: Every day | ORAL | Status: DC
  Administered 2018-08-16 – 2018-08-21 (×6): 40 mg via ORAL
  Filled 2018-08-14 (×7): qty 1

## 2018-08-14 MED ORDER — LEVOTHYROXINE SODIUM 137 MCG PO TABS: 137.0000 ug | ORAL_TABLET | Freq: Every day | ORAL | Status: DC

## 2018-08-14 MED ORDER — SODIUM CHLORIDE 0.9 % IV SOLN
INTRAVENOUS | Status: AC
  Administered 2018-08-14: 21:00:00 via INTRAVENOUS

## 2018-08-14 MED ORDER — WARFARIN - PHARMACIST DOSING INPATIENT: Freq: Every day | Status: DC

## 2018-08-14 MED ORDER — ONDANSETRON HCL 4 MG/2ML IJ SOLN
4.0000 mg | Freq: Four times a day (QID) | INTRAMUSCULAR | Status: DC | PRN
  Administered 2018-08-15: 4 mg via INTRAVENOUS
  Filled 2018-08-14 (×2): qty 2

## 2018-08-14 MED ORDER — MAGNESIUM SULFATE IN D5W 1-5 GM/100ML-% IV SOLN
1.0000 g | Freq: Once | INTRAVENOUS | Status: AC
  Administered 2018-08-14: 1 g via INTRAVENOUS
  Filled 2018-08-14: qty 100

## 2018-08-14 MED ORDER — OXYBUTYNIN CHLORIDE 5 MG PO TABS: 5.0000 mg | ORAL_TABLET | Freq: Every day | ORAL | Status: DC | PRN

## 2018-08-14 MED ORDER — SODIUM CHLORIDE 0.9% FLUSH: 3.0000 mL | Freq: Once | INTRAVENOUS | Status: DC

## 2018-08-14 MED ORDER — POTASSIUM CHLORIDE CRYS ER 20 MEQ PO TBCR
40.0000 meq | EXTENDED_RELEASE_TABLET | Freq: Once | ORAL | Status: AC
  Administered 2018-08-14: 40 meq via ORAL
  Filled 2018-08-14: qty 2

## 2018-08-14 MED ORDER — LEVOTHYROXINE SODIUM 25 MCG PO TABS
137.0000 ug | ORAL_TABLET | Freq: Every day | ORAL | Status: DC
  Administered 2018-08-15: 137 ug via ORAL
  Filled 2018-08-14: qty 1

## 2018-08-14 MED ORDER — METRONIDAZOLE IN NACL 5-0.79 MG/ML-% IV SOLN
500.0000 mg | Freq: Three times a day (TID) | INTRAVENOUS | Status: DC
  Administered 2018-08-15 – 2018-08-17 (×7): 500 mg via INTRAVENOUS
  Filled 2018-08-14 (×8): qty 100

## 2018-08-14 MED ORDER — WARFARIN SODIUM 5 MG PO TABS
5.0000 mg | ORAL_TABLET | Freq: Once | ORAL | Status: AC
  Administered 2018-08-14: 5 mg via ORAL
  Filled 2018-08-14 (×2): qty 1

## 2018-08-14 MED ORDER — IBUPROFEN 200 MG PO TABS: 400.0000 mg | ORAL_TABLET | Freq: Four times a day (QID) | ORAL | Status: DC | PRN

## 2018-08-14 MED ORDER — SODIUM CHLORIDE 0.9 % IV BOLUS
1000.0000 mL | Freq: Once | INTRAVENOUS | Status: AC
  Administered 2018-08-14: 1000 mL via INTRAVENOUS

## 2018-08-14 MED ORDER — TRAMADOL HCL 50 MG PO TABS
50.0000 mg | ORAL_TABLET | Freq: Once | ORAL | Status: AC
  Administered 2018-08-14: 50 mg via ORAL
  Filled 2018-08-14: qty 1

## 2018-08-14 MED ORDER — ACETAMINOPHEN 325 MG PO TABS: 650.0000 mg | ORAL_TABLET | Freq: Four times a day (QID) | ORAL | Status: DC | PRN

## 2018-08-14 MED ORDER — METRONIDAZOLE IN NACL 5-0.79 MG/ML-% IV SOLN
500.0000 mg | Freq: Once | INTRAVENOUS | Status: AC
  Administered 2018-08-14: 500 mg via INTRAVENOUS
  Filled 2018-08-14: qty 100

## 2018-08-14 MED ORDER — PREDNISONE 5 MG PO TABS
5.0000 mg | ORAL_TABLET | Freq: Every day | ORAL | Status: DC
  Administered 2018-08-15 – 2018-08-22 (×8): 5 mg via ORAL
  Filled 2018-08-14 (×8): qty 1

## 2018-08-14 NOTE — Progress Notes (Signed)
Right lower extremity venous duplex has been completed. Preliminary results can be found in CV Proc through chart review.  Results were given to Margarita Mail PA.  08/14/18 4:28 PM Carlos Levering RVT

## 2018-08-14 NOTE — Progress Notes (Signed)
ANTICOAGULATION CONSULT NOTE - Initial Consult  Pharmacy Consult for Warfarin (on warfarin PTA) Indication: H/O PE and DVT  Allergies  Allergen Reactions  . Oxycodone-Acetaminophen Shortness Of Breath and Nausea Only  . Propoxyphene Nausea Only and Shortness Of Breath    States takes tylenol at home  . Propoxyphene N-Acetaminophen Shortness Of Breath and Nausea Only  . Sulfonamide Derivatives Shortness Of Breath and Nausea Only  . Codeine Nausea Only  . Hydrocodone-Acetaminophen   . Hydromorphone   . Other Other (See Comments)    No blood, Jehovaeh Witness   . Oxycodone   . Sulfamethoxazole   . Tape     Redness**PAPER TAPE OK**  . Gabapentin Anxiety    twitching  . Latex Rash  . Metoprolol Rash  . Morphine And Related Rash    IV site on arm is red, patient reports this is improving.  NO shortness of breath reported.  . Rosiglitazone Rash    Patient Measurements: Height: 5\' 4"  (162.6 cm) Weight: 184 lb (83.5 kg) IBW/kg (Calculated) : 54.7   Vital Signs: Temp: 100 F (37.8 C) (03/09 1949) Temp Source: Oral (03/09 1949) BP: 129/88 (03/09 2101) Pulse Rate: 112 (03/09 2101)  Labs: Recent Labs    08/14/18 1324 08/14/18 1653  HGB 13.6  --   HCT 45.1  --   PLT 252  --   LABPROT  --  15.4*  INR  --  1.2  CREATININE 0.77  --     Estimated Creatinine Clearance: 81.1 mL/min (by C-G formula based on SCr of 0.77 mg/dL).   Medical History: Past Medical History:  Diagnosis Date  . Anxiety   . Candida esophagitis ([place]) 11/12/2014  . CEREBROVASCULAR ACCIDENT, ACUTE 04/15/2010  . CLOSTRIDIUM DIFFICILE COLITIS, HX OF 08/21/2007  . CONGESTIVE HEART FAILURE 08/21/2007  . Current use of long term anticoagulation    Dr. Andree Elk, [facility]  . CVA 04/17/2010  . Depression    Dr. Andree Elk, [place], TYPE II 08/21/2007  . DVT, HX OF 08/21/2007  . GERD 08/21/2007  . GOUT 08/21/2007  . History of stroke with residual effects   . HYPERLIPIDEMIA 08/21/2007  .  HYPERTENSION 08/21/2007   Dr. Andree Elk, [place], HX OF 08/22/2007   s/p renal transplant-Dr. Andree Elk, [place] 08/21/2007  . OSTEOPOROSIS 08/21/2007   Rheumatol at [facility]  . Pulmonary embolism ([place]) 07/16/2010  . Renal failure   . RENAL INSUFFICIENCY 08/21/2007  . Right sided weakness   . Steroid-induced hyperglycemia 11/09/2014  . Tachycardia   . THYROID NODULE, LEFT 04/10/2009    Medications:  PTA Warfarin regimen:  2.5mg  daily except 5mg  on Mon & Fri  Assessment:  58 y.o. female admitted on 08/14/2018 with sepsis.  PMH significant for lupus, kidney transplant, h/o PE & DVT.  INR upon admission = 1.2 (INR 3 weeks ago = 2.0)  Patient reports last dose of warfarin taken on 08/11/2018  Pharmacy consulted to dose warfarin  Goal of Therapy:  INR 2-3   Plan:   Warfarin 5mg  po x 1 dose tonight  Check daily PT/INR  Cniyah Sproull, Toribio Harbour, PharmD 08/14/2018,9:28 PM

## 2018-08-14 NOTE — H&P (Signed)
History and Physical    Connie Ruiz MWN:[id number] DOB: Nov 21, 1960 DOA: 08/14/2018  PCP: Patient, No Pcp Per Patient coming from: Home  Chief Complaint: Flulike symptoms  HPI: Connie Ruiz is a 58 y.o. female with medical history significant of CVA, diastolic CHF, type 2 diabetes, DVT, PE, GERD, gout, hypertension, hyperlipidemia, lupus, kidney transplant on chronic immunosuppressive therapy, thyroid nodule presenting to the hospital for evaluation of flulike symptoms.  Patient reports 3-day history of fevers, nonproductive cough, rhinorrhea, sore throat, generalized abdominal pain, nausea, vomiting, fatigue, and body aches.  Denies any recent travel or exposure to any individual with a viral illness.  Denies any dysuria, urinary frequency, or urgency.  Review of Systems: As per HPI otherwise 10 point review of systems negative.  Past Medical History:  Diagnosis Date  . Anxiety   . Candida esophagitis ([place]) 11/12/2014  . CEREBROVASCULAR ACCIDENT, ACUTE 04/15/2010  . CLOSTRIDIUM DIFFICILE COLITIS, HX OF 08/21/2007  . CONGESTIVE HEART FAILURE 08/21/2007  . Current use of long term anticoagulation    Dr. Andree Elk, [facility]  . CVA 04/17/2010  . Depression    Dr. Andree Elk, [place], TYPE II 08/21/2007  . DVT, HX OF 08/21/2007  . GERD 08/21/2007  . GOUT 08/21/2007  . History of stroke with residual effects   . HYPERLIPIDEMIA 08/21/2007  . HYPERTENSION 08/21/2007   Dr. Andree Elk, [place], HX OF 08/22/2007   s/p renal transplant-Dr. Andree Elk, [place] 08/21/2007  . OSTEOPOROSIS 08/21/2007   [facility] at [facility]  . Pulmonary embolism ([place]) 07/16/2010  . Renal failure   . RENAL INSUFFICIENCY 08/21/2007  . Right sided weakness   . Steroid-induced hyperglycemia 11/09/2014  . Tachycardia   . THYROID NODULE, LEFT 04/10/2009    Past Surgical History:  Procedure Laterality Date  . CESAREAN SECTION    . CHOLECYSTECTOMY    . ENTEROSCOPY N/A 11/11/2014   Procedure:  ENTEROSCOPY;  Surgeon: Ladene Artist, MD;  Location: [facility];  Service: Endoscopy;  Laterality: N/A;  . KIDNEY TRANSPLANT Right 2009  . RENAL BIOPSY, OPEN  1981  . TUBAL LIGATION       reports that she has never smoked. She has never used smokeless tobacco. She reports that she does not drink alcohol or use drugs.  Allergies  Allergen Reactions  . Oxycodone-Acetaminophen Shortness Of Breath and Nausea Only  . Propoxyphene Nausea Only and Shortness Of Breath    States takes tylenol at home  . Propoxyphene N-Acetaminophen Shortness Of Breath and Nausea Only  . Sulfonamide Derivatives Shortness Of Breath and Nausea Only  . Codeine Nausea Only  . Hydrocodone-Acetaminophen   . Hydromorphone   . Other Other (See Comments)    No blood, Jehovaeh Witness   . Oxycodone   . Sulfamethoxazole   . Tape     Redness**PAPER TAPE OK**  . Gabapentin Anxiety    twitching  . Latex Rash  . Metoprolol Rash  . Morphine And Related Rash    IV site on arm is red, patient reports this is improving.  NO shortness of breath reported.  . Rosiglitazone Rash    Family History  Problem Relation Age of Onset  . Heart attack Mother   . Heart disease Father   . Asthma Sister   . Asthma Sister   . Asthma Daughter   . Cancer Maternal Grandfather        prostate  . Cancer Paternal Grandfather  colon    Prior to Admission medications   Medication Sig Start Date End Date Taking? Authorizing Provider  alendronate (FOSAMAX) 70 MG tablet TAKE 1 TABLET BY MOUTH ONCE A WEEK ON AN EMPTY STOMACH ON SATURDAYS WITH A FULL GLASS OF WATER. 09/17/17   Renato Shin, MD  atorvastatin (LIPITOR) 40 MG tablet Take 1 tablet (40 mg total) by mouth daily. 07/26/17   [provider]  Blood Glucose Monitoring Suppl (ONETOUCH VERIO IQ SYSTEM) W/DEVICE KIT Use to check blood sugar 1 time per day 02/12/15   Renato Shin, MD  calcitRIOL (ROCALTROL) 0.25 MCG capsule TAKE 1 CAPSULE(0.25 MCG) BY MOUTH DAILY 07/17/18    Renato Shin, MD  diltiazem [facility]) 240 MG 24 hr capsule Take 1 capsule (240 mg total) by mouth daily. 10/12/17   Renato Shin, MD  donepezil (ARICEPT) 10 MG tablet TAKE 1 TABLET(10 MG) BY MOUTH AT BEDTIME 11/18/16   [provider]  esomeprazole (NEXIUM) 20 MG capsule TAKE 1 CAPSULE(20 MG) BY MOUTH DAILY 07/17/18   Renato Shin, MD  glucose blood (ONE TOUCH ULTRA TEST) test strip Use to check blood sugar 1 time per day 02/12/15   Renato Shin, MD  levothyroxine (SYNTHROID, LEVOTHROID) 125 MCG tablet TAKE 1 TABLET BY MOUTH DAILY BEFORE BREAKFAST 05/19/18   Renato Shin, MD  levothyroxine (SYNTHROID, LEVOTHROID) 137 MCG tablet Take 1 tablet (137 mcg total) by mouth daily before breakfast. 02/21/18   Renato Shin, MD  losartan (COZAAR) 100 MG tablet TAKE 1 TABLET(100 MG) BY MOUTH DAILY 07/20/18   Renato Shin, MD  magnesium oxide (MAG-OX) 400 MG tablet TAKE 2 TABLETS( 800 MG TOTAL) BY MOUTH DAILY 01/11/17   [provider]  metFORMIN (GLUCOPHAGE-XR) 500 MG 24 hr tablet Take 1 tablet (500 mg total) by mouth daily with breakfast. 06/27/17   Renato Shin, MD  mirtazapine (REMERON) 30 MG tablet Take 1 tablet (30 mg total) by mouth at bedtime. 07/28/15   Pieter Partridge, DO  mycophenolate (CELLCEPT) 250 MG capsule TAKE ONE CAPSULE BY MOUTH TWICE DAILY 07/13/17   [provider]  nystatin (MYCOSTATIN) powder Apply 1 g topically 4 (four) times daily as needed. For yeast under breast    [provider]  ondansetron (ZOFRAN ODT) 4 MG disintegrating tablet 25m ODT q4 hours prn nausea/vomiting 06/15/15   Mesner, JCorene Cornea MD  [facility] PaDEL[facility] LANCETS [id number] Use to check blood sugar 1 time per day 02/12/15   ERenato Shin MD  oxybutynin (DITROPAN) 5 MG tablet Take 5 mg by mouth daily as needed for bladder spasms.     [provider]  Potassium Chloride ER 20 MEQ TBCR Take 20 mEq by mouth daily. 02/21/18   ERenato Shin MD  predniSONE (DELTASONE) 5 MG tablet TAKE 1 TABLET(5 MG)  BY MOUTH DAILY 07/13/17   [provider]  QUEtiapine (SEROQUEL) 25 MG tablet Take 1 tablet (25 mg total) by mouth at bedtime. 07/28/15   JPieter Partridge DO  sertraline (ZOLOFT) 100 MG tablet Take 0.5 tablets (50 mg total) by mouth daily. 01/25/17   ERenato Shin MD  tacrolimus (PROGRAF) 1 MG capsule TAKE 2 CAPSULES (2MG) BY MOUTH IN MORNING. TAKE 1 CAPSULE (1MG) IN EVENING. 01/15/18   [provider]  traMADol (ULTRAM) 50 MG tablet Take 1 tablet (50 mg total) by mouth every 8 (eight) hours as needed. 07/07/15   ERenato Shin MD  warfarin (COUMADIN) 5 MG tablet Take 1/2 tablet (2.5 mg) by mouth on Tuesday, Thursday and Sunday,t take  1 tablet (5 mg) all other days    [provider]  warfarin (COUMADIN) 5 MG tablet TAKE 1/2 TO 1 TABLET BY MOUTH DAILY AS DIRECTED BY COUMADIN CLINIC 06/20/18   Lorretta Harp, MD    Physical Exam: Vitals:   08/14/18 2030 08/14/18 2100 08/14/18 2101 08/14/18 2130  BP: 140/78 129/88 129/88 (!) 146/85  Pulse: (!) 112 (!) 114 (!) 112 (!) 105  Resp: '13 19 18 13  ' Temp:      TempSrc:      SpO2: 95% 99% 97% 95%  Weight:      Height:        Physical Exam  Constitutional: She is oriented to person, place, and time. She appears well-developed and well-nourished.  HENT:  Head: Normocephalic.  Dry mucous membranes  Eyes: Right eye exhibits no discharge. Left eye exhibits no discharge.  Neck: Neck supple.  Cardiovascular: Regular rhythm and intact distal pulses.  Tachycardic  Pulmonary/Chest: Effort normal and breath sounds normal. No respiratory distress. She has no wheezes. She has no rales.  Abdominal: Soft. Bowel sounds are normal. She exhibits no distension. There is abdominal tenderness.  Mild generalized tenderness to palpation. No rebound, guarding, or rigidity.  Musculoskeletal:        General: Edema present.     Comments: Bilateral lower extremities appear mildly edematous Right lower extremity appears slightly larger in size in  comparison to the contralateral side.  Neurological: She is alert and oriented to person, place, and time.  Skin: Skin is warm and dry. She is not diaphoretic.     Labs on Admission: I have personally reviewed following labs and imaging studies  CBC: Recent Labs  Lab 08/14/18 1324  WBC 10.4  HGB 13.6  HCT 45.1  MCV 93.6  PLT 950   Basic Metabolic Panel: Recent Labs  Lab 08/14/18 1324 08/14/18 2100  NA 140  --   K 3.2*  --   CL 106  --   CO2 25  --   GLUCOSE 135*  --   BUN 18  --   CREATININE 0.77  --   CALCIUM 8.9  --   MG  --  1.6*   GFR: Estimated Creatinine Clearance: 81.1 mL/min (by C-G formula based on SCr of 0.77 mg/dL). Liver Function Tests: Recent Labs  Lab 08/14/18 1324  AST 16  ALT 9  ALKPHOS 76  BILITOT 1.6*  PROT 6.9  ALBUMIN 3.6   Recent Labs  Lab 08/14/18 1324  LIPASE 30   No results for input(s): AMMONIA in the last 168 hours. Coagulation Profile: Recent Labs  Lab 08/14/18 1653  INR 1.2   Cardiac Enzymes: No results for input(s): CKTOTAL, CKMB, CKMBINDEX, TROPONINI in the last 168 hours. BNP (last 3 results) No results for input(s): PROBNP in the last 8760 hours. HbA1C: No results for input(s): HGBA1C in the last 72 hours. CBG: No results for input(s): GLUCAP in the last 168 hours. Lipid Profile: No results for input(s): CHOL, HDL, LDLCALC, TRIG, CHOLHDL, LDLDIRECT in the last 72 hours. Thyroid Function Tests: Recent Labs    08/14/18 2100  TSH 64.113*   Anemia Panel: No results for input(s): VITAMINB12, FOLATE, FERRITIN, TIBC, IRON, RETICCTPCT in the last 72 hours. Urine analysis:    Component Value Date/Time   COLORURINE AMBER (A) 07/16/2017 1905   APPEARANCEUR CLEAR 07/16/2017 1905   LABSPEC 1.031 (H) 07/16/2017 1905   PHURINE 5.0 07/16/2017 1905   GLUCOSEU NEGATIVE 07/16/2017 1905   GLUCOSEU NEGATIVE 10/22/2013  [place] 07/16/2017 [place] (A) 07/16/2017 1905   KETONESUR 80 (A)  07/16/2017 1905   PROTEINUR 30 (A) 07/16/2017 1905   UROBILINOGEN 1.0 03/27/2015 1534   NITRITE NEGATIVE 07/16/2017 1905   LEUKOCYTESUR NEGATIVE 07/16/2017 1905    Radiological Exams on Admission: Dg Chest 2 View  Result Date: 08/14/2018 CLINICAL DATA:  Nonproductive cough. EXAM: CHEST - 2 VIEW COMPARISON:  July 16, 2017 FINDINGS: The heart size and mediastinal contours are within normal limits. Both lungs are clear. The visualized skeletal structures are unremarkable. IMPRESSION: No active cardiopulmonary disease. Electronically Signed   By: Dorise Bullion III M.D   On: 08/14/2018 18:31   Vas [place] Lower Extremity Venous (dvt) (only [place])  Result Date: 08/14/2018  Lower Venous Study Indications: Swelling.  Limitations: Poor ultrasound/tissue interface. Performing Technologist: Oliver Hum RVT  Examination Guidelines: A complete evaluation includes B-mode imaging, spectral Doppler, color Doppler, and power Doppler as needed of all accessible portions of each vessel. Bilateral testing is considered an integral part of a complete examination. Limited examinations for reoccurring indications may be performed as noted.  Right Venous Findings: +---------+---------------+---------+-----------+----------+-------+          CompressibilityPhasicitySpontaneityPropertiesSummary +---------+---------------+---------+-----------+----------+-------+ CFV      Full           Yes      Yes                          +---------+---------------+---------+-----------+----------+-------+ SFJ      Full                                                 +---------+---------------+---------+-----------+----------+-------+ FV Prox  Full                                                 +---------+---------------+---------+-----------+----------+-------+ FV Mid                  Yes      Yes                          +---------+---------------+---------+-----------+----------+-------+ FV  DistalFull                                                 +---------+---------------+---------+-----------+----------+-------+ PFV      Full                                                 +---------+---------------+---------+-----------+----------+-------+ POP      Full           Yes      Yes                          +---------+---------------+---------+-----------+----------+-------+ PTV      Full                                                 +---------+---------------+---------+-----------+----------+-------+  PERO     Full                                                 +---------+---------------+---------+-----------+----------+-------+  Left Venous Findings: +---+---------------+---------+-----------+----------+-------+    CompressibilityPhasicitySpontaneityPropertiesSummary +---+---------------+---------+-----------+----------+-------+ CFVFull           Yes      Yes                          +---+---------------+---------+-----------+----------+-------+    Summary: Right: There is no evidence of deep vein thrombosis in the lower extremity. However, portions of this examination were limited- see technologist comments above. No cystic structure found in the popliteal fossa. Left: No evidence of common femoral vein obstruction.  *See table(s) above for measurements and observations. Electronically signed by Ruta Hinds MD on 08/14/2018 at 4:59:14 PM.    Final     EKG: Independently reviewed.  Sinus tachycardia. LAFB similar to prior tracing.  Assessment/Plan Principal Problem:   Sepsis ([place]) Active Problems:   LUPUS   Pulmonary embolism ([facility])   Hypokalemia   Type 2 diabetes mellitus ([place])   Sepsis, unclear source -Patient is presenting with a 3-day history of fevers, nonproductive cough, rhinorrhea, sore throat, generalized abdominal pain, nausea, vomiting, fatigue, and body aches.  Denies any recent travel or exposure to any individual with a  viral illness. -Febrile with T-max 100.3 F, tachycardic, not hypotensive -No leukocytosis.  Lactic acid normal.  Lipase normal.  AST, ALT, ALP normal.  T bili mildly elevated at 1.6.  Has mild generalized tenderness on abdominal palpation; no rebound, guarding, or rigidity.  Will continue to monitor LFTs. -Chest x-ray not suggestive of pneumonia. -Right lower extremity appears larger in size in comparison to the left lower extremity.  However, right lower extremity Doppler negative for DVT. -Continue broad-spectrum antibiotics including vancomycin, cefepime, and metronidazole. -Received 1 L normal saline bolus in the ED.  Will give additional 1 L bolus and continue maintenance IV fluid. -Blood culture x2 pending -Respiratory viral panel pending -UA pending  Mild hypokalemia Potassium 3.2.  Magnesium 1.6.  Likely secondary to decreased p.o. intake/emesis. -Replete potassium and magnesium.  Continue to monitor BMP.  Type 2 diabetes Blood glucose 135. -Check A1c.  Sliding scale insulin and CBG checks.  History of PE, DVT -Patient is on Coumadin at home.  INR subtherapeutic at 1.2.  Right lower extremity appears larger in size comparison to the left lower extremity on exam.  However, right lower extremity Doppler negative for DVT. -Coumadin dosing per pharmacy -Continue to monitor PT/INR  History of thyroid nodule, hypothyroidism. Currently on Synthroid. -TSH elevated at 64.  Check free T4 level.  Adjust dose of Synthroid accordingly.  History of lupus, renal transplant on chronic immunosuppressive therapy -Hold CellCept and tacrolimus at this time given sepsis -Continue home prednisone 5 mg daily -Check a.m. cortisol level  GERD -Continue PPI  DVT prophylaxis: Coumadin Code Status: Patient wishes to be full code. Family Communication: No family available. Disposition Plan: Anticipate discharge after clinical improvement. Consults called: None Admission status:  Observation   Shela Leff MD [facility] Pager [phone]  If 7PM-7AM, please contact night-coverage [email or web address] Password [facility]  08/14/2018, 10:35 PM

## 2018-08-14 NOTE — Progress Notes (Signed)
Pharmacy Antibiotic Note  Connie Ruiz is a 58 y.o. female admitted on 08/14/2018 with sepsis.  PMH significant for lupus, kidney transplant, h/o PE & DVT.  In the ED, patient received initial doses of Vancomycin and Cefepime.  MD has also ordered Flagyl.  Upon admission, Pharmacy has been consulted for Vancomycin and Cefepime dosing.  Plan: Vancomycin 1250 mg IV Q 24 hrs. Goal AUC 400-550; Expected AUC: 498.9; SCr used: 0.8 (rounded up from 0.77)  Cefepime 1gm IV q8h  Flagyl per MD  Height: 5\' 4"  (162.6 cm) Weight: 184 lb (83.5 kg) IBW/kg (Calculated) : 54.7  Temp (24hrs), Avg:100.3 F (37.9 C), Min:98.7 F (37.1 C), Max:102.6 F (39.2 C)  Recent Labs  Lab 08/14/18 1324 08/14/18 1547  WBC 10.4  --   CREATININE 0.77  --   LATICACIDVEN  --  1.3    Estimated Creatinine Clearance: 81.1 mL/min (by C-G formula based on SCr of 0.77 mg/dL).    Allergies  Allergen Reactions  . Oxycodone-Acetaminophen Shortness Of Breath and Nausea Only  . Propoxyphene Nausea Only and Shortness Of Breath    States takes tylenol at home  . Propoxyphene N-Acetaminophen Shortness Of Breath and Nausea Only  . Sulfonamide Derivatives Shortness Of Breath and Nausea Only  . Codeine Nausea Only  . Hydrocodone-Acetaminophen   . Hydromorphone   . Other Other (See Comments)    No blood, Jehovaeh Witness   . Oxycodone   . Sulfamethoxazole   . Tape     Redness**PAPER TAPE OK**  . Gabapentin Anxiety    twitching  . Latex Rash  . Metoprolol Rash  . Morphine And Related Rash    IV site on arm is red, patient reports this is improving.  NO shortness of breath reported.  . Rosiglitazone Rash    Antimicrobials this admission: 3/9 Flagyl >>   3/9 Cefepime >>   3/9 Vanc >>  Dose adjustments this admission:    Microbiology results: 3/9 BCx: sent 3/9 Resp Panel PCR: sent  Thank you for allowing pharmacy to be a part of this patient's care.  Everette Rank, PharmD 08/14/2018 8:45 PM

## 2018-08-14 NOTE — Progress Notes (Signed)
A consult was received from an ED physician for Vancomycin and Cefepime per pharmacy dosing.  The patient's profile has been reviewed for ht/wt/allergies/indication/available labs.    A one time order has been placed for Vancomycin 1750mg  IV (20mg /kg) and Cefepime 2gm IV.  Further antibiotics/pharmacy consults should be ordered by admitting physician if indicated.                       Thank you, Everette Rank, PharmD 08/14/2018  3:54 PM

## 2018-08-14 NOTE — ED Notes (Signed)
ED TO INPATIENT HANDOFF REPORT  ED Nurse Name and Phone #: jon rn ed    Name/Age/Gender Connie Ruiz 58 y.o. female Room/Bed: WA10/WA10  Code Status   Code Status: Full Code  Home/SNF/Other Home {Patient oriented to alert x4  Is this baseline yes   Triage Complete: Triage complete  Chief Complaint uri symptoms  Triage Note Patient c/o a non prooductive cough, N/V, body aches x 3 days. patient denies any abdominal pain.   Allergies Allergies  Allergen Reactions  . Oxycodone-Acetaminophen Shortness Of Breath and Nausea Only  . Propoxyphene Nausea Only and Shortness Of Breath    States takes tylenol at home  . Propoxyphene N-Acetaminophen Shortness Of Breath and Nausea Only  . Sulfonamide Derivatives Shortness Of Breath and Nausea Only  . Codeine Nausea Only  . Hydrocodone-Acetaminophen   . Hydromorphone   . Other Other (See Comments)    No blood, Jehovaeh Witness   . Oxycodone   . Sulfamethoxazole   . Tape     Redness**PAPER TAPE OK**  . Gabapentin Anxiety    twitching  . Latex Rash  . Metoprolol Rash  . Morphine And Related Rash    IV site on arm is red, patient reports this is improving.  NO shortness of breath reported.  . Rosiglitazone Rash    Level of Care/Admitting Diagnosis ED Disposition    ED Disposition Condition [facility] Area: [place] [[id number]]  Level of Care: Telemetry [5]  Admit to tele based on following criteria: Other see comments  Comments: tachycardia  Diagnosis: Sepsis [facility]) [[id number]]  Admitting Physician: Shela Leff [[id number]]  Attending Physician: Shela Leff [[id number]]  PT Class (Do Not Modify): Observation [104]  PT Acc Code (Do Not Modify): Observation [10022]       B Medical/Surgery History Past Medical History:  Diagnosis Date  . Anxiety   . Candida esophagitis ([place]) 11/12/2014  . CEREBROVASCULAR ACCIDENT, ACUTE 04/15/2010  . CLOSTRIDIUM DIFFICILE COLITIS, HX OF  08/21/2007  . CONGESTIVE HEART FAILURE 08/21/2007  . Current use of long term anticoagulation    Dr. Andree Elk, [facility]  . CVA 04/17/2010  . Depression    Dr. Andree Elk, [place], TYPE II 08/21/2007  . DVT, HX OF 08/21/2007  . GERD 08/21/2007  . GOUT 08/21/2007  . History of stroke with residual effects   . HYPERLIPIDEMIA 08/21/2007  . HYPERTENSION 08/21/2007   Dr. Andree Elk, [place], HX OF 08/22/2007   s/p renal transplant-Dr. Andree Elk, [place] 08/21/2007  . OSTEOPOROSIS 08/21/2007   Rheumatol at [facility]  . Pulmonary embolism ([place]) 07/16/2010  . Renal failure   . RENAL INSUFFICIENCY 08/21/2007  . Right sided weakness   . Steroid-induced hyperglycemia 11/09/2014  . Tachycardia   . THYROID NODULE, LEFT 04/10/2009   Past Surgical History:  Procedure Laterality Date  . CESAREAN SECTION    . CHOLECYSTECTOMY    . ENTEROSCOPY N/A 11/11/2014   Procedure: ENTEROSCOPY;  Surgeon: Ladene Artist, MD;  Location: [facility];  Service: Endoscopy;  Laterality: N/A;  . KIDNEY TRANSPLANT Right 2009  . RENAL BIOPSY, OPEN  1981  . TUBAL LIGATION       A IV Location/Drains/Wounds Patient Lines/Drains/Airways Status   Active Line/Drains/Airways    Name:   Placement date:   Placement time:   Site:   Days:   Peripheral IV 08/14/18 Left;Upper Arm   08/14/18    1709    Arm  less than 1   Fistula / Graft Right Forearm Arteriovenous fistula   -    -    Forearm      External Urinary Catheter   07/17/17    0844    -   393          Intake/Output Last 24 hours No intake or output data in the 24 hours ending 08/14/18 2138  Labs/Imaging Results for orders placed or performed during the hospital encounter of 08/14/18 (from the past 48 hour(s))  Lipase, blood     Status: None   Collection Time: 08/14/18  1:24 PM  Result Value Ref Range   Lipase 30 11 - 51 U/L    Comment: Performed at [facility], [place] [address], [place], [place] [address]   Comprehensive metabolic panel     Status: Abnormal   Collection Time: 08/14/18  1:24 PM  Result Value Ref Range   Sodium 140 135 - 145 mmol/L   Potassium 3.2 (L) 3.5 - 5.1 mmol/L   Chloride 106 98 - 111 mmol/L   CO2 25 22 - 32 mmol/L   Glucose, Bld 135 (H) 70 - 99 mg/dL   BUN 18 6 - 20 mg/dL   Creatinine, Ser 0.77 0.44 - 1.00 mg/dL   Calcium 8.9 8.9 - 10.3 mg/dL   Total Protein 6.9 6.5 - 8.1 g/dL   Albumin 3.6 3.5 - 5.0 g/dL   AST 16 15 - 41 U/L   ALT 9 0 - 44 U/L   Alkaline Phosphatase 76 38 - 126 U/L   Total Bilirubin 1.6 (H) 0.3 - 1.2 mg/dL   GFR calc non Af Amer >60 >60 mL/min   GFR calc Af Amer >60 >60 mL/min   Anion gap 9 5 - 15    Comment: Performed at [facility], [place] [address] [place], [place] [address]  CBC     Status: None   Collection Time: 08/14/18  1:24 PM  Result Value Ref Range   WBC 10.4 4.0 - 10.5 K/uL   RBC 4.82 3.87 - 5.11 MIL/uL   Hemoglobin 13.6 12.0 - 15.0 g/dL   HCT 45.1 36.0 - 46.0 %   MCV 93.6 80.0 - 100.0 fL   MCH 28.2 26.0 - 34.0 pg   MCHC 30.2 30.0 - 36.0 g/dL   RDW 14.7 11.5 - 15.5 %   Platelets 252 150 - 400 K/uL   nRBC 0.0 0.0 - 0.2 %    Comment: Performed at [facility], [place] [address], [place], [place] [address]  Lactic acid, plasma     Status: None   Collection Time: 08/14/18  3:47 PM  Result Value Ref Range   Lactic Acid, Venous 1.3 0.5 - 1.9 mmol/L    Comment: Performed at [facility], [place] [address] [place], [place] [address]  Protime-INR     Status: Abnormal   Collection Time: 08/14/18  4:53 PM  Result Value Ref Range   Prothrombin Time 15.4 (H) 11.4 - 15.2 seconds   INR 1.2 0.8 - 1.2    Comment: (NOTE) INR goal varies based on device and disease states. Performed at [facility], [place] [address] [place], [place] [address]   Magnesium     Status: Abnormal   Collection Time: 08/14/18  9:00 PM  Result Value Ref Range   Magnesium 1.6 (L) 1.7 -  2.4 mg/dL    Comment: Performed at [facility], [address] [place], [place] [address]   Dg Chest 2 View  Result Date: 08/14/2018 CLINICAL DATA:  Nonproductive cough. EXAM: CHEST - 2 VIEW COMPARISON:  July 16, 2017 FINDINGS: The heart size and mediastinal contours are within normal limits. Both lungs are clear. The visualized skeletal structures are unremarkable. IMPRESSION: No active cardiopulmonary disease. Electronically Signed   By: Dorise Bullion III M.D   On: 08/14/2018 18:31   Vas [place] Lower Extremity Venous (dvt) (only [place])  Result Date: 08/14/2018  Lower Venous Study Indications: Swelling.  Limitations: Poor ultrasound/tissue interface. Performing Technologist: Oliver Hum RVT  Examination Guidelines: A complete evaluation includes B-mode imaging, spectral Doppler, color Doppler, and power Doppler as needed of all accessible portions of each vessel. Bilateral testing is considered an integral part of a complete examination. Limited examinations for reoccurring indications may be performed as noted.  Right Venous Findings: +---------+---------------+---------+-----------+----------+-------+          CompressibilityPhasicitySpontaneityPropertiesSummary +---------+---------------+---------+-----------+----------+-------+ CFV      Full           Yes      Yes                          +---------+---------------+---------+-----------+----------+-------+ SFJ      Full                                                 +---------+---------------+---------+-----------+----------+-------+ FV Prox  Full                                                 +---------+---------------+---------+-----------+----------+-------+ FV Mid                  Yes      Yes                          +---------+---------------+---------+-----------+----------+-------+ FV DistalFull                                                  +---------+---------------+---------+-----------+----------+-------+ PFV      Full                                                 +---------+---------------+---------+-----------+----------+-------+ POP      Full           Yes      Yes                          +---------+---------------+---------+-----------+----------+-------+ PTV      Full                                                 +---------+---------------+---------+-----------+----------+-------+ PERO     Full                                                 +---------+---------------+---------+-----------+----------+-------+  Left Venous Findings: +---+---------------+---------+-----------+----------+-------+    CompressibilityPhasicitySpontaneityPropertiesSummary +---+---------------+---------+-----------+----------+-------+ CFVFull           Yes      Yes                          +---+---------------+---------+-----------+----------+-------+    Summary: Right: There is no evidence of deep vein thrombosis in the lower extremity. However, portions of this examination were limited- see technologist comments above. No cystic structure found in the popliteal fossa. Left: No evidence of common femoral vein obstruction.  *See table(s) above for measurements and observations. Electronically signed by Ruta Hinds MD on 08/14/2018 at 4:59:14 PM.    Final     Pending Labs Unresulted Labs (From admission, onward)    Start     Ordered   08/15/18 8841  Basic metabolic panel  Tomorrow morning,   R     08/14/18 2039   08/15/18 0500  Hemoglobin A1c  Tomorrow morning,   R    Comments:  To assess prior glycemic control    08/14/18 2117   08/15/18 0500  Protime-INR  Daily,   R     08/14/18 2128   08/14/18 2035  HIV antibody (Routine Testing)  Once,   R     08/14/18 2039   08/14/18 1957  TSH  ONCE - STAT,   STAT     08/14/18 1956   08/14/18 1549  Respiratory Panel by PCR  (Respiratory virus panel with precautions)   Once,   R     08/14/18 1549   08/14/18 1547  Blood Culture (routine x 2)  BLOOD CULTURE X 2,   STAT     08/14/18 1548   08/14/18 1256  Urinalysis, Routine w reflex microscopic  ONCE - STAT,   STAT     08/14/18 1255          Vitals/Pain Today's Vitals   08/14/18 1943 08/14/18 1949 08/14/18 2000 08/14/18 2101  BP:  (!) 159/75 (!) 148/78 129/88  Pulse:  (!) 126 (!) 117 (!) 112  Resp:  15 14 18   Temp:  100 F (37.8 C)    TempSrc:  Oral    SpO2:  97% 96% 97%  Weight:      Height:      PainSc: 5  5       Isolation Precautions Droplet precaution  Medications Medications  sodium chloride flush (NS) 0.9 % injection 3 mL (3 mLs Intravenous Not Given 08/14/18 1849)  metroNIDAZOLE (FLAGYL) IVPB 500 mg (has no administration in time range)  ondansetron (ZOFRAN) injection 4 mg (has no administration in time range)  0.9 %  sodium chloride infusion ( Intravenous New Bag/Given 08/14/18 2059)  ibuprofen (ADVIL,MOTRIN) tablet 400 mg (has no administration in time range)  ceFEPIme (MAXIPIME) 1 g in sodium chloride 0.9 % 100 mL IVPB (has no administration in time range)  vancomycin (VANCOCIN) 1,250 mg in sodium chloride 0.9 % 250 mL IVPB (has no administration in time range)  insulin aspart (novoLOG) injection 0-9 Units (has no administration in time range)  Warfarin - Pharmacist Dosing Inpatient (has no administration in time range)  warfarin (COUMADIN) tablet 5 mg (has no administration in time range)  ondansetron (ZOFRAN-ODT) disintegrating tablet 4 mg (4 mg Oral Given 08/14/18 1302)  acetaminophen (TYLENOL) tablet 650 mg (650 mg Oral Given 08/14/18 1302)  ceFEPIme (MAXIPIME) 2 g in sodium chloride 0.9 % 100 mL IVPB (0 g Intravenous Stopped 08/14/18 1752)  metroNIDAZOLE (FLAGYL) IVPB 500 mg (0 mg Intravenous Stopped 08/14/18 1853)  vancomycin (VANCOCIN) 1,750 mg in sodium chloride 0.9 % 500 mL IVPB (1,750 mg Intravenous New Bag/Given 08/14/18 1857)  traMADol (ULTRAM) tablet 50 mg (50 mg Oral Given  08/14/18 1857)  acetaminophen (TYLENOL) tablet 650 mg (650 mg Oral Given 08/14/18 1857)  sodium chloride 0.9 % bolus 1,000 mL (1,000 mLs Intravenous New Bag/Given 08/14/18 1949)  sodium chloride 0.9 % bolus 1,000 mL (1,000 mLs Intravenous New Bag/Given 08/14/18 2105)  potassium chloride SA (K-DUR,KLOR-CON) CR tablet 40 mEq (40 mEq Oral Given 08/14/18 2112)    Mobility walks Low fall risk   Focused Assessments     R Recommendations: See Admitting Provider Note  Report given to:   Additional Notes: right restriction

## 2018-08-14 NOTE — ED Notes (Addendum)
[place] at bedside. PIV attempt x2 unsuccessful. Blood cultures and plasma lactic acid sent to lab for evaluation.

## 2018-08-14 NOTE — ED Triage Notes (Signed)
Patient c/o a non prooductive cough, N/V, body aches x 3 days. patient denies any abdominal pain.

## 2018-08-14 NOTE — ED Provider Notes (Signed)
[facility] DEPT Provider Note   CSN: [id number] Arrival date & time: 08/14/18  1216    History   Chief Complaint Chief Complaint  Patient presents with  . Cough  . Emesis  . Fever    HPI Connie Ruiz is a 58 y.o. female with a past medical history of lupus, kidney transplant.  She is chronically immunosuppressed on prednisone, Prograf, and CellCept.  She is chronically anticoagulated on Coumadin.  She presents today for fever and chest congestion.  She developed chest congestion, fever, chills and body aches 3 days ago.  He has a productive cough.  She does not smoke.  She is also complaining of pain in her right knee which is chronic but worse at this time.  She has swelling in her lower extremities which is worse on the right side.  She has a history of PE and DVT previously.  She denies hemoptysis.  She denies any recent foreign travel, travel to [place] state, exposure to contacts with novel coronavirus.     HPI  Past Medical History:  Diagnosis Date  . Anxiety   . Candida esophagitis ([place]) 11/12/2014  . CEREBROVASCULAR ACCIDENT, ACUTE 04/15/2010  . CLOSTRIDIUM DIFFICILE COLITIS, HX OF 08/21/2007  . CONGESTIVE HEART FAILURE 08/21/2007  . Current use of long term anticoagulation    Dr. Andree Elk, [facility]  . CVA 04/17/2010  . Depression    Dr. Andree Elk, [place], TYPE II 08/21/2007  . DVT, HX OF 08/21/2007  . GERD 08/21/2007  . GOUT 08/21/2007  . History of stroke with residual effects   . HYPERLIPIDEMIA 08/21/2007  . HYPERTENSION 08/21/2007   Dr. Andree Elk, [place], HX OF 08/22/2007   s/p renal transplant-Dr. Andree Elk, [place] 08/21/2007  . OSTEOPOROSIS 08/21/2007   [facility]ol at [facility]  . Pulmonary embolism ([place]) 07/16/2010  . Renal failure   . RENAL INSUFFICIENCY 08/21/2007  . Right sided weakness   . Steroid-induced hyperglycemia 11/09/2014  . Tachycardia   . THYROID NODULE, LEFT 04/10/2009     Patient Active Problem List   Diagnosis Date Noted  . Acute respiratory failure with hypoxia ([place]) 07/20/2017  . Abdominal bloating 07/20/2017  . Sinus tachycardia 07/20/2017  . SLE (systemic lupus erythematosus related syndrome) ([place]) 07/20/2017  . CAP (community acquired pneumonia) 07/16/2017  . Long term (current) use of anticoagulants [Z79.01] 06/21/2017  . Screening examination for infectious disease 01/28/2016  . Type 2 diabetes mellitus with complication, without long-term current use of insulin ([place]) 08/03/2015  . Vascular dementia ([place]) 05/08/2015  . Hemiparesis as late effect of cerebrovascular accident (CVA) ([place]) 05/08/2015  . Aphasia as late effect of stroke 05/08/2015  . C. difficile colitis 02/25/2015  . Hypokalemia 02/25/2015  . CKD (chronic kidney disease) 02/25/2015  . Colitis 02/14/2015  . Sepsis ([place]) 02/14/2015  . Nausea vomiting and diarrhea 02/14/2015  . Abdominal pain 02/14/2015  . UTI (lower urinary tract infection) 02/14/2015  . Candida esophagitis ([place]) 11/12/2014  . Abnormal CT of the abdomen   . Steroid-induced hyperglycemia 11/09/2014  . Hypomagnesemia 11/06/2014  . Abdominal pain, acute   . Supratherapeutic INR 11/02/2014  . Jejunitis 11/02/2014  . Myalgia and myositis 04/05/2014  . Wellness examination 04/05/2014  . Menopausal state 04/05/2014  . Palpitations 08/31/2013  . Hyperlipidemia 08/31/2013  . Disorder of heart rhythm 08/13/2013  . TIA (transient ischemic attack) 06/20/2013  . Gout flare: R elbow and R shoulder 06/20/2013  . Acute encephalopathy 06/14/2013  .  Rhabdomyolysis 06/14/2013  . History of stroke with residual effects   . Right sided weakness   . Breast mass, left 04/30/2013  . Contusion of knee, left 10/18/2012  . Depression 10/05/2012  . Encounter for long-term (current) use of other medications 10/04/2012  . DVT (deep venous thrombosis) ([place]) 06/17/2011  . Expressive aphasia 06/17/2011  . Dysphasia 06/11/2011  .  Incontinence of urine 06/11/2011  . Hand pain, left 06/11/2011  . Arm pain, left 05/07/2011  . Routine general medical examination at a health care facility 04/13/2011  . Anticoagulated 01/08/2011  . Pulmonary embolism ([place]) 07/16/2010  . Cerebral artery occlusion with cerebral infarction ([place]) 04/17/2010  . CEREBROVASCULAR ACCIDENT, ACUTE 04/15/2010  . THYROID NODULE, LEFT 04/10/2009  . COUGH 03/26/2009  . ACUTE BRONCHITIS 08/22/2007  . KIDNEY TRANSPLANTATION, HX OF 08/22/2007  . Gout 08/21/2007  . Essential hypertension 08/21/2007  . Chronic diastolic congestive heart failure ([place]) 08/21/2007  . GERD 08/21/2007  . Disorder resulting from impaired renal function 08/21/2007  . LUPUS 08/21/2007  . Osteoporosis 08/21/2007  . DVT, HX OF 08/21/2007  . Enteritis due to Clostridium difficile 08/21/2007    Past Surgical History:  Procedure Laterality Date  . CESAREAN SECTION    . CHOLECYSTECTOMY    . ENTEROSCOPY N/A 11/11/2014   Procedure: ENTEROSCOPY;  Surgeon: Ladene Artist, MD;  Location: [facility] ENDOSCOPY;  Service: Endoscopy;  Laterality: N/A;  . KIDNEY TRANSPLANT Right 2009  . RENAL BIOPSY, OPEN  1981  . TUBAL LIGATION       OB History    Gravida  1   Para  1   Term  1   Preterm      AB      Living  1     SAB      TAB      Ectopic      Multiple      Live Births  1            Home Medications    Prior to Admission medications   Medication Sig Start Date End Date Taking? Authorizing Provider  alendronate (FOSAMAX) 70 MG tablet TAKE 1 TABLET BY MOUTH ONCE A WEEK ON AN EMPTY STOMACH ON SATURDAYS WITH A FULL GLASS OF WATER. 09/17/17   Renato Shin, MD  atorvastatin (LIPITOR) 40 MG tablet Take 1 tablet (40 mg total) by mouth daily. 07/26/17   [provider]  Blood Glucose Monitoring Suppl (ONETOUCH VERIO IQ SYSTEM) W/DEVICE KIT Use to check blood sugar 1 time per day 02/12/15   Renato Shin, MD  calcitRIOL (ROCALTROL) 0.25 MCG capsule TAKE 1  CAPSULE(0.25 MCG) BY MOUTH DAILY 07/17/18   Renato Shin, MD  diltiazem [facility]) 240 MG 24 hr capsule Take 1 capsule (240 mg total) by mouth daily. 10/12/17   Renato Shin, MD  donepezil (ARICEPT) 10 MG tablet TAKE 1 TABLET(10 MG) BY MOUTH AT BEDTIME 11/18/16   [provider]  esomeprazole (NEXIUM) 20 MG capsule TAKE 1 CAPSULE(20 MG) BY MOUTH DAILY 07/17/18   Renato Shin, MD  glucose blood (ONE TOUCH ULTRA TEST) test strip Use to check blood sugar 1 time per day 02/12/15   Renato Shin, MD  levothyroxine (SYNTHROID, LEVOTHROID) 125 MCG tablet TAKE 1 TABLET BY MOUTH DAILY BEFORE BREAKFAST 05/19/18   Renato Shin, MD  levothyroxine (SYNTHROID, LEVOTHROID) 137 MCG tablet Take 1 tablet (137 mcg total) by mouth daily before breakfast. 02/21/18   Renato Shin, MD  losartan (COZAAR) 100 MG tablet TAKE 1 TABLET(100  MG) BY MOUTH DAILY 07/20/18   Renato Shin, MD  magnesium oxide (MAG-OX) 400 MG tablet TAKE 2 TABLETS( 800 MG TOTAL) BY MOUTH DAILY 01/11/17   [provider]  metFORMIN (GLUCOPHAGE-XR) 500 MG 24 hr tablet Take 1 tablet (500 mg total) by mouth daily with breakfast. 06/27/17   Renato Shin, MD  mirtazapine (REMERON) 30 MG tablet Take 1 tablet (30 mg total) by mouth at bedtime. 07/28/15   Pieter Partridge, DO  mycophenolate (CELLCEPT) 250 MG capsule TAKE ONE CAPSULE BY MOUTH TWICE DAILY 07/13/17   [provider]  nystatin (MYCOSTATIN) powder Apply 1 g topically 4 (four) times daily as needed. For yeast under breast    [provider]  ondansetron (ZOFRAN ODT) 4 MG disintegrating tablet 73m ODT q4 hours prn nausea/vomiting 06/15/15   Mesner, JCorene Cornea MD  [facility] LANCETS [id number] Use to check blood sugar 1 time per day 02/12/15   ERenato Shin MD  oxybutynin (DITROPAN) 5 MG tablet Take 5 mg by mouth daily as needed for bladder spasms.     [provider]  Potassium Chloride ER 20 MEQ TBCR Take 20 mEq by mouth daily. 02/21/18   ERenato Shin MD  predniSONE  (DELTASONE) 5 MG tablet TAKE 1 TABLET(5 MG) BY MOUTH DAILY 07/13/17   [provider]  QUEtiapine (SEROQUEL) 25 MG tablet Take 1 tablet (25 mg total) by mouth at bedtime. 07/28/15   JPieter Partridge DO  sertraline (ZOLOFT) 100 MG tablet Take 0.5 tablets (50 mg total) by mouth daily. 01/25/17   ERenato Shin MD  tacrolimus (PROGRAF) 1 MG capsule TAKE 2 CAPSULES (2MG) BY MOUTH IN MORNING. TAKE 1 CAPSULE (1MG) IN EVENING. 01/15/18   [provider]  traMADol (ULTRAM) 50 MG tablet Take 1 tablet (50 mg total) by mouth every 8 (eight) hours as needed. 07/07/15   ERenato Shin MD  warfarin (COUMADIN) 5 MG tablet Take 1/2 tablet (2.5 mg) by mouth on Tuesday, Thursday and Sunday,t take 1 tablet (5 mg) all other days    [provider]  warfarin (COUMADIN) 5 MG tablet TAKE 1/2 TO 1 TABLET BY MOUTH DAILY AS DIRECTED BY COUMADIN CLINIC 06/20/18   BLorretta Harp MD    Family History Family History  Problem Relation Age of Onset  . Heart attack Mother   . Heart disease Father   . Asthma Sister   . Asthma Sister   . Asthma Daughter   . Cancer Maternal Grandfather        prostate  . Cancer Paternal Grandfather        colon    Social History Social History   Tobacco Use  . Smoking status: Never Smoker  . Smokeless tobacco: Never Used  Substance Use Topics  . Alcohol use: No    Alcohol/week: 0.0 standard drinks  . Drug use: No     Allergies   Oxycodone-acetaminophen; Propoxyphene; Propoxyphene n-acetaminophen; Sulfonamide derivatives; Codeine; Hydrocodone-acetaminophen; Hydromorphone; Other; Oxycodone; Sulfamethoxazole; Tape; Gabapentin; Latex; Metoprolol; Morphine and related; and Rosiglitazone   Review of Systems Review of Systems  Ten systems reviewed and are negative for acute change, except as noted in the HPI.   Physical Exam Updated Vital Signs BP (!) 143/95 (BP Location: Left Arm)   Pulse (!) 126   Temp 99.8 F (37.7 C) (Oral)   Resp 20   Ht _0   (1.626 m)   Wt 83.5 kg   SpO2 98%   BMI 31.58 kg/m   Physical Exam  Vitals signs and nursing note reviewed.  Constitutional:      Appearance: She is ill-appearing. She is not toxic-appearing.  HENT:     Head: Normocephalic and atraumatic.     Right Ear: External ear normal.     Left Ear: External ear normal.     Nose: Congestion present.     Mouth/Throat:     Mouth: Mucous membranes are moist.  Eyes:     Extraocular Movements: Extraocular movements intact.     Pupils: Pupils are equal, round, and reactive to light.  Neck:     Musculoskeletal: Normal range of motion and neck supple. No neck rigidity.  Cardiovascular:     Rate and Rhythm: Regular rhythm. Tachycardia present.     Pulses: Normal pulses.  Pulmonary:     Breath sounds: No wheezing or rhonchi.  Abdominal:     General: There is no distension.     Tenderness: There is no abdominal tenderness.  Musculoskeletal:     Right lower leg: Edema present.     Left lower leg: Edema present.     Comments: R>L extremity edema Able to flex and extend both knee joints  Skin:    General: Skin is warm.     Capillary Refill: Capillary refill takes less than 2 seconds.  Neurological:     General: No focal deficit present.     Mental Status: She is alert and oriented to person, place, and time.  Psychiatric:        Behavior: Behavior normal.        Thought Content: Thought content normal.      ED Treatments / Results  Labs (all labs ordered are listed, but only abnormal results are displayed) Labs Reviewed  COMPREHENSIVE METABOLIC PANEL - Abnormal; Notable for the following components:      Result Value   Potassium 3.2 (*)    Glucose, Bld 135 (*)    Total Bilirubin 1.6 (*)    All other components within normal limits  CULTURE, BLOOD (ROUTINE X 2)  CULTURE, BLOOD (ROUTINE X 2)  RESPIRATORY PANEL BY PCR  LIPASE, BLOOD  CBC  URINALYSIS, ROUTINE W REFLEX MICROSCOPIC  LACTIC ACID, PLASMA  LACTIC ACID, PLASMA     EKG None  Radiology No results found.  Procedures .Critical Care Performed by: Margarita Mail, PA-C Authorized by: Margarita Mail, PA-C   Critical care provider statement:    Critical care time (minutes):  45   Critical care time was exclusive of:  Separately billable procedures and treating other patients   Critical care was necessary to treat or prevent imminent or life-threatening deterioration of the following conditions:  Sepsis   Critical care was time spent personally by me on the following activities:  Discussions with consultants, evaluation of patient's response to treatment, examination of patient, ordering and performing treatments and interventions, ordering and review of laboratory studies, ordering and review of radiographic studies, pulse oximetry, re-evaluation of patient's condition, obtaining history from patient or surrogate, review of old charts and development of treatment plan with patient or surrogate   (including critical care time)  Medications Ordered in ED Medications  sodium chloride flush (NS) 0.9 % injection 3 mL (has no administration in time range)  ceFEPIme (MAXIPIME) 2 g in sodium chloride 0.9 % 100 mL IVPB (has no administration in time range)  metroNIDAZOLE (FLAGYL) IVPB 500 mg (has no administration in time range)  vancomycin (VANCOCIN) IVPB 1000 mg/200 mL premix (has no administration in time range)  ondansetron (  ZOFRAN-ODT) disintegrating tablet 4 mg (4 mg Oral Given 08/14/18 1302)  acetaminophen (TYLENOL) tablet 650 mg (650 mg Oral Given 08/14/18 1302)     Initial Impression / Assessment and Plan / ED Course  I have reviewed the triage vital signs and the nursing notes.  Pertinent labs & imaging results that were available during my care of the patient were reviewed by me and considered in my medical decision making (see chart for details).  Clinical Course as of Aug 14 2242  Mon Aug 14, 2018  1936 Pulse Rate(!): 132 [AH]    Clinical  Course User Index [AH] Margarita Mail, PA-C     58 year old female with multiple comorbidities, chronically immune suppressed.  That she has a kidney transplant review of her lab results show her kidney function appears excellent.  She arrives febrile and tachycardic with hypertension.  Medially started on broad-spectrum antibiotics per the sepsis protocol.  I ordered a respiratory viral panel and blood cultures.  Patient's white blood cell count is not elevated, negative lactic acid.  Patient of note has multinodular goiter as I reviewed her EMR.  I did add on a TSH for concern of potential thyrotoxicosis.  Her TSH is pending.  Chest x-ray shows no acute abnormalities.  Ultrasound of the right lower extremity shows no evidence of DVT patient continues to have persistent elevated heart rate despite patient appears stable and improving throughout her ER visit she will be admitted.    Final Clinical Impressions(s) / ED Diagnoses   Final diagnoses:  Fever, unspecified fever cause  Tachycardia  Hypertension, unspecified type    ED Discharge Orders    None       Margarita Mail, PA-C 08/14/18 2249    Fredia Sorrow, MD 08/15/18 [phone]

## 2018-08-15 ENCOUNTER — Observation Stay (HOSPITAL_COMMUNITY): Payer: Medicare Other

## 2018-08-15 DIAGNOSIS — R509 Fever, unspecified: Secondary | ICD-10-CM

## 2018-08-15 DIAGNOSIS — E1122 Type 2 diabetes mellitus with diabetic chronic kidney disease: Secondary | ICD-10-CM

## 2018-08-15 DIAGNOSIS — M329 Systemic lupus erythematosus, unspecified: Secondary | ICD-10-CM

## 2018-08-15 DIAGNOSIS — K6389 Other specified diseases of intestine: Secondary | ICD-10-CM | POA: Diagnosis not present

## 2018-08-15 DIAGNOSIS — I10 Essential (primary) hypertension: Secondary | ICD-10-CM

## 2018-08-15 DIAGNOSIS — Z992 Dependence on renal dialysis: Secondary | ICD-10-CM

## 2018-08-15 DIAGNOSIS — N186 End stage renal disease: Secondary | ICD-10-CM

## 2018-08-15 LAB — HEMOGLOBIN A1C
Hgb A1c MFr Bld: 6.7 % — ABNORMAL HIGH (ref 4.8–5.6)
Mean Plasma Glucose: 145.59 mg/dL

## 2018-08-15 LAB — PROTIME-INR
INR: 1.3 — ABNORMAL HIGH (ref 0.8–1.2)
Prothrombin Time: 16.3 seconds — ABNORMAL HIGH (ref 11.4–15.2)

## 2018-08-15 LAB — URINALYSIS, ROUTINE W REFLEX MICROSCOPIC
Glucose, UA: NEGATIVE mg/dL
Hgb urine dipstick: NEGATIVE
Ketones, ur: 20 mg/dL — AB
Leukocytes,Ua: NEGATIVE
Nitrite: NEGATIVE
Protein, ur: NEGATIVE mg/dL
Specific Gravity, Urine: 1.036 — ABNORMAL HIGH (ref 1.005–1.030)
pH: 6 (ref 5.0–8.0)

## 2018-08-15 LAB — GLUCOSE, CAPILLARY
Glucose-Capillary: 108 mg/dL — ABNORMAL HIGH (ref 70–99)
Glucose-Capillary: 110 mg/dL — ABNORMAL HIGH (ref 70–99)
Glucose-Capillary: 114 mg/dL — ABNORMAL HIGH (ref 70–99)
Glucose-Capillary: 143 mg/dL — ABNORMAL HIGH (ref 70–99)
Glucose-Capillary: 97 mg/dL (ref 70–99)

## 2018-08-15 LAB — HIV ANTIBODY (ROUTINE TESTING W REFLEX): HIV Screen 4th Generation wRfx: NONREACTIVE

## 2018-08-15 LAB — BASIC METABOLIC PANEL
Anion gap: 5 (ref 5–15)
BUN: 15 mg/dL (ref 6–20)
CO2: 23 mmol/L (ref 22–32)
Calcium: 8 mg/dL — ABNORMAL LOW (ref 8.9–10.3)
Chloride: 114 mmol/L — ABNORMAL HIGH (ref 98–111)
Creatinine, Ser: 0.6 mg/dL (ref 0.44–1.00)
GFR calc Af Amer: >60 mL/min (ref >60)
GFR calc non Af Amer: >60 mL/min (ref >60)
Glucose, Bld: 106 mg/dL — ABNORMAL HIGH (ref 70–99)
Potassium: 3.6 mmol/L (ref 3.5–5.1)
Sodium: 142 mmol/L (ref 135–145)

## 2018-08-15 LAB — T4, FREE: Free T4: 0.43 ng/dL — ABNORMAL LOW (ref 0.82–1.77)

## 2018-08-15 LAB — OCCULT BLOOD GASTRIC / DUODENUM (SPECIMEN CUP)
Occult Blood, Gastric: POSITIVE — AB
pH, Gastric: 4

## 2018-08-15 MED ORDER — IOHEXOL 300 MG/ML  SOLN
30.0000 mL | Freq: Once | INTRAMUSCULAR | Status: AC | PRN
  Administered 2018-08-15: 30 mL via ORAL

## 2018-08-15 MED ORDER — LEVOTHYROXINE SODIUM 50 MCG PO TABS
150.0000 ug | ORAL_TABLET | Freq: Every day | ORAL | Status: DC
  Administered 2018-08-16 – 2018-08-22 (×7): 150 ug via ORAL
  Filled 2018-08-15 (×7): qty 1

## 2018-08-15 MED ORDER — SODIUM CHLORIDE 0.9 % IV SOLN
INTRAVENOUS | Status: AC
  Administered 2018-08-15: 11:00:00 via INTRAVENOUS

## 2018-08-15 MED ORDER — WARFARIN SODIUM 5 MG PO TABS
5.0000 mg | ORAL_TABLET | Freq: Once | ORAL | Status: DC
  Filled 2018-08-15 (×2): qty 1

## 2018-08-15 MED ORDER — PROMETHAZINE HCL 25 MG/ML IJ SOLN
6.2500 mg | Freq: Four times a day (QID) | INTRAMUSCULAR | Status: DC | PRN
  Administered 2018-08-15 – 2018-08-21 (×5): 6.25 mg via INTRAVENOUS
  Filled 2018-08-15 (×5): qty 1

## 2018-08-15 MED ORDER — SUCRALFATE 1 GM/10ML PO SUSP
1.0000 g | Freq: Three times a day (TID) | ORAL | Status: DC
  Administered 2018-08-15 – 2018-08-22 (×26): 1 g via ORAL
  Filled 2018-08-15 (×26): qty 10

## 2018-08-15 MED ORDER — TRAMADOL HCL 50 MG PO TABS
50.0000 mg | ORAL_TABLET | Freq: Three times a day (TID) | ORAL | Status: DC | PRN
  Administered 2018-08-15 – 2018-08-21 (×2): 50 mg via ORAL
  Filled 2018-08-15 (×5): qty 1

## 2018-08-15 MED ORDER — ONDANSETRON HCL 4 MG/2ML IJ SOLN
INTRAMUSCULAR | Status: AC
  Administered 2018-08-15: 4 mg via INTRAVENOUS
  Filled 2018-08-15: qty 2

## 2018-08-15 MED ORDER — IOHEXOL 300 MG/ML  SOLN
100.0000 mL | Freq: Once | INTRAMUSCULAR | Status: AC | PRN
  Administered 2018-08-15: 80 mL via INTRAVENOUS

## 2018-08-15 MED ORDER — SODIUM CHLORIDE (PF) 0.9 % IJ SOLN
INTRAMUSCULAR | Status: AC
  Administered 2018-08-15: 16:00:00
  Filled 2018-08-15: qty 50

## 2018-08-15 MED ORDER — PANTOPRAZOLE SODIUM 40 MG IV SOLR
40.0000 mg | Freq: Two times a day (BID) | INTRAVENOUS | Status: DC
  Administered 2018-08-15 – 2018-08-17 (×4): 40 mg via INTRAVENOUS
  Filled 2018-08-15 (×4): qty 40

## 2018-08-15 NOTE — Progress Notes (Signed)
[facility] for Warfarin (on warfarin PTA) Indication: H/O PE and DVT  Allergies  Allergen Reactions  . Oxycodone-Acetaminophen Shortness Of Breath and Nausea Only  . Propoxyphene Nausea Only and Shortness Of Breath    States takes tylenol at home  . Propoxyphene N-Acetaminophen Shortness Of Breath and Nausea Only  . Sulfonamide Derivatives Shortness Of Breath and Nausea Only  . Codeine Nausea Only  . Hydrocodone-Acetaminophen   . Hydromorphone   . Other Other (See Comments)    No blood, Jehovaeh Witness   . Oxycodone   . Sulfamethoxazole   . Tape     Redness**PAPER TAPE OK**  . Gabapentin Anxiety    twitching  . Latex Rash  . Metoprolol Rash  . Morphine And Related Rash    IV site on arm is red, patient reports this is improving.  NO shortness of breath reported.  . Rosiglitazone Rash    Patient Measurements: Height: 5\' 4"  (162.6 cm) Weight: 183 lb 12.8 oz (83.4 kg) IBW/kg (Calculated) : 54.7   Vital Signs: Temp: 98.3 F (36.8 C) (03/10 0531) Temp Source: Oral (03/10 0531) BP: 152/97 (03/10 0531) Pulse Rate: 102 (03/10 0531)  Labs: Recent Labs    08/14/18 1324 08/14/18 1653 08/15/18 0454  HGB 13.6  --   --   HCT 45.1  --   --   PLT 252  --   --   LABPROT  --  15.4* 16.3*  INR  --  1.2 1.3*  CREATININE 0.77  --  0.60    Estimated Creatinine Clearance: 81.1 mL/min (by C-G formula based on SCr of 0.6 mg/dL).   Medical History: Past Medical History:  Diagnosis Date  . Anxiety   . Candida esophagitis ([place]) 11/12/2014  . CEREBROVASCULAR ACCIDENT, ACUTE 04/15/2010  . CLOSTRIDIUM DIFFICILE COLITIS, HX OF 08/21/2007  . CONGESTIVE HEART FAILURE 08/21/2007  . Current use of long term anticoagulation    Dr. Andree Elk, [facility]  . CVA 04/17/2010  . Depression    Dr. Andree Elk, [place], TYPE II 08/21/2007  . DVT, HX OF 08/21/2007  . GERD 08/21/2007  . GOUT 08/21/2007  . History of stroke with residual  effects   . HYPERLIPIDEMIA 08/21/2007  . HYPERTENSION 08/21/2007   Dr. Andree Elk, [place], HX OF 08/22/2007   s/p renal transplant-Dr. Andree Elk, [place] 08/21/2007  . OSTEOPOROSIS 08/21/2007   Rheumatol at [facility]  . Pulmonary embolism ([place]) 07/16/2010  . Renal failure   . RENAL INSUFFICIENCY 08/21/2007  . Right sided weakness   . Steroid-induced hyperglycemia 11/09/2014  . Tachycardia   . THYROID NODULE, LEFT 04/10/2009    Medications:  PTA Warfarin regimen:  2.5mg  daily except 5mg  on Mon & Fri  Assessment: 58 y.o. female admitted on 08/14/2018 with sepsis.  PMH significant for lupus, kidney transplant, h/o PE & DVT. Pharmacy consulted to continue dosing warfarin on admission.  Today, 08/15/2018  INR upon admission = 1.2 (INR 3 weeks ago = 2.0), reports last dose taken 08/11/2018  INR today remains subtherapeutic 1.3  CBC 3/9: Hgb 13.6, Plts wnl  Drug interactions: broad spectrum abx (vanc/cefepime/flagyl) can increase warfarin sensitivity  Goal of Therapy:  INR 2-3   Plan:   Warfarin 5mg  po x 1 dose, give early at 10a  Check daily PT/INR  Peggyann Juba, PharmD, BCPS Pager: [phone] 08/15/2018,7:12 AM

## 2018-08-15 NOTE — Progress Notes (Addendum)
Patient had another episode of emesis. She vomited 200cc of what appears to be coffee ground emesis. MD made aware. Orders placed. Will carry out orders and continue to monitor patient. Patient given Phenergan per MAR. Patient refusing Lipitor and Coumadin at this time d/t vomiting. Also holding Coumadin until results of gastric contents returned.    Regarding patient reporting that Zofran makes her "loopy." She received a dose at 1540 and has been sleeping since except when she was woken by RN to take her evening medicines. No confusion noted and patient is easily arousable. When she was woken up she did report that she felt she would vomit and that is when the above episode occurred.   Derrica Sieg, [place]

## 2018-08-15 NOTE — Progress Notes (Signed)
Patient vomited large amount of dark brown emesis with large chunks in it while down getting a CT Abd scan. Per CT staff, they were concerned about patient d/t her not being able to physically roll onto her side while vomiting. Staff called bedside RN about medication for vomiting and she reported that she had paged the MD and was waiting on a call back or new orders. All that was ordered was Zofran and the patient reported that Zofran makes her "loopy", though is it not listed on her allergy list. Because the patient was actively vomiting large amounts and was supine the RN down in radiology discussed with bedside RN that Zofran was going to be given d/t immediate need for medication. CT staff requested that bedside RN come down to the CT room to assist with the patient. Upon entering the CT room, the patient was supine with vomit all over her. CT staff reported that they were able to complete the CT scan and that the patient had not vomited since the Zofran had been given. Patient appeared to be slightly lethargic but was arousable. This was this RN's first encounter with the patient as shift handoff had just been given. Patient returned to the room and given a full bath with linen change and hair washed. O2 sats were 95% on 2L O2. Patient does not appear to be in any acute stress at this time. Will continue to monitor.  Connie Ruiz, [place] 08/15/2018 4:53 PM

## 2018-08-15 NOTE — Progress Notes (Signed)
Gastric contents sent for occult came back positive. MD paged with results.

## 2018-08-15 NOTE — Progress Notes (Signed)
PROGRESS NOTE  Connie Ruiz WGN:[id number] DOB: 1960-06-30 DOA: 08/14/2018 PCP: Patient, No Pcp Per   LOS: 0 days   Brief Narrative / Interim history: 58 year old lady with history of prior CVA, diastolic CHF, type 2 diabetes mellitus, prior DVT and PE, hypertension, hyperlipidemia, lupus, kidney transplant on chronic immunosuppressive therapy, who came to the hospital with flulike symptoms.  She reports 3-day history of fevers, nonproductive cough, runny nose and sore throat.  She also has been having generalized abdominal pain along with nausea, vomiting and diarrhea.  No recent travel or sick contacts.  Subjective: -She is feeling about the same as yesterday, appreciates no improvement overnight.  She is complaining of lower right-sided abdominal pain along with nausea.  She also been complaining of runny nose and congestion  Assessment & Plan: Principal Problem:   Sepsis ([place]) Active Problems:   LUPUS   Pulmonary embolism (HCC)   Hypokalemia   Type 2 diabetes mellitus (H[facility])   Principal Problem Sepsis due to upper respiratory illness/rhinovirus infection -Met criteria for sepsis with fever of 102.6 in the ED, heart rate 125.  She was also placed on broad-spectrum antibiotics with cefepime, vancomycin as well as metronidazole.  Given immunosuppression continue cefepime and metronidazole at this time but discontinue Vancomycin to avoid nephrotoxicity -Respiratory virus panel was positive for rhinovirus -All the other cultures are pending  Active Problems Abdominal pain -Patient has been having intermittent abdominal pain for a number of years, and has had imaging in the past.  She tells me that this time around her pain is different, it is mainly in the right lower quadrant and she does not recall experiencing this type of pain. -We will obtain a CT scan of the abdomen and pelvis with contrast, this was discussed with patient, she is a renal transplant patient and agrees with  intravenous contrast to obtain better quality images.  At this time her renal function is normal and has been receiving contrast several times throughout the last years  Hypothyroidism -TSH was elevated at 64.  She is currently on Synthroid.  No role to check free T4, discussed with patient at bedside and she is taking her Synthroid with all her other medications including PPI as well as calcium and probably does not absorb much.  Given TSH elevation favored to increase the dose, educated patient about correct Synthroid administration and will need repeat TSH in 3 to 4 weeks as an outpatient  History of PE and DVT -Patient is on Coumadin at home, she is subtherapeutic here at 1.2.  DVT studies in the ER were negative given right lower extremity swelling -Continue Coumadin per pharmacy  History of lupus, renal transplant on chronic immunosuppressive therapy -CellCept and tacrolimus were held on admission, check tacrolimus level -Continue home prednisone 5 mg daily  Type 2 diabetes mellitus -Continue sliding scale   Scheduled Meds: . atorvastatin  40 mg Oral q1800  . calcitRIOL  0.25 mcg Oral QODAY  . insulin aspart  0-9 Units Subcutaneous TID WC  . levothyroxine  137 mcg Oral Q0600  . pantoprazole  40 mg Oral Daily  . predniSONE  5 mg Oral Q breakfast  . sodium chloride flush  3 mL Intravenous Once  . warfarin  5 mg Oral Once  . Warfarin - Pharmacist Dosing Inpatient   Does not apply q1800   Continuous Infusions: . sodium chloride 125 mL/hr at 08/15/18 1105  . ceFEPime (MAXIPIME) IV 1 g (08/15/18 0856)  . metronidazole 500 mg (08/15/18 1104)  PRN Meds:.ibuprofen, ondansetron (ZOFRAN) IV, oxybutynin, traMADol  DVT prophylaxis: On Coumadin Code Status: Full code Family Communication: No family present at bedside Disposition Plan: Home when ready  Consultants:   None  Procedures:   None   Antimicrobials:  Vancomycin 3/9 >> 3/10  Cefepime 3/9 >>  Metronidazole 3/9 >>    Objective: Vitals:   08/14/18 2130 08/14/18 2248 08/14/18 2248 08/15/18 0531  BP: (!) 146/85  (!) 147/98 (!) 152/97  Pulse: (!) 105  (!) 105 (!) 102  Resp: '13  16 14  ' Temp:   99.2 F (37.3 C) 98.3 F (36.8 C)  TempSrc:   Oral Oral  SpO2: 95%  100% 97%  Weight:  83.4 kg    Height:  '5\' 4"'  (1.626 m)      Intake/Output Summary (Last 24 hours) at 08/15/2018 1120 Last data filed at 08/15/2018 0600 Gross per 24 hour  Intake 1370 ml  Output 100 ml  Net 1270 ml   Filed Weights   08/14/18 1251 08/14/18 2248  Weight: 83.5 kg 83.4 kg    Examination:  Constitutional: NAD Eyes: PERRL, lids and conjunctivae normal ENMT: Mucous membranes are moist.  Respiratory: clear to auscultation bilaterally, no wheezing, no crackles. Normal respiratory effort.  Cardiovascular: Regular rate and rhythm, no murmurs / rubs / gallops. No LE edema. 2+ pedal pulses.  Abdomen: Mildly tender to palpation in the right lower quadrant, no guarding or rebound.  Bowel sounds positive Musculoskeletal: no clubbing / cyanosis.  Skin: no rashes Neurologic: CN 2-12 grossly intact. Strength 5/5 in all 4.    Data Reviewed: I have independently reviewed following labs and imaging studies   CBC: Recent Labs  Lab 08/14/18 1324  WBC 10.4  HGB 13.6  HCT 45.1  MCV 93.6  PLT 161   Basic Metabolic Panel: Recent Labs  Lab 08/14/18 1324 08/14/18 2100 08/15/18 0454  NA 140  --  142  K 3.2*  --  3.6  CL 106  --  114*  CO2 25  --  23  GLUCOSE 135*  --  106*  BUN 18  --  15  CREATININE 0.77  --  0.60  CALCIUM 8.9  --  8.0*  MG  --  1.6*  --    GFR: Estimated Creatinine Clearance: 81.1 mL/min (by C-G formula based on SCr of 0.6 mg/dL). Liver Function Tests: Recent Labs  Lab 08/14/18 1324  AST 16  ALT 9  ALKPHOS 76  BILITOT 1.6*  PROT 6.9  ALBUMIN 3.6   Recent Labs  Lab 08/14/18 1324  LIPASE 30   No results for input(s): AMMONIA in the last 168 hours. Coagulation Profile: Recent Labs  Lab  08/14/18 1653 08/15/18 0454  INR 1.2 1.3*   Cardiac Enzymes: No results for input(s): CKTOTAL, CKMB, CKMBINDEX, TROPONINI in the last 168 hours. BNP (last 3 results) No results for input(s): PROBNP in the last 8760 hours. HbA1C: Recent Labs    08/15/18 0454  HGBA1C 6.7*   CBG: Recent Labs  Lab 08/15/18 0009 08/15/18 0735  GLUCAP 108* 97   Lipid Profile: No results for input(s): CHOL, HDL, LDLCALC, TRIG, CHOLHDL, LDLDIRECT in the last 72 hours. Thyroid Function Tests: Recent Labs    08/14/18 2100 08/15/18 0454  TSH 64.113*  --   FREET4  --  0.43*   Anemia Panel: No results for input(s): VITAMINB12, FOLATE, FERRITIN, TIBC, IRON, RETICCTPCT in the last 72 hours. Urine analysis:    Component Value Date/Time   COLORURINE AMBER (  A) 08/14/2018 2100   APPEARANCEUR CLEAR 08/14/2018 2100   LABSPEC 1.036 (H) 08/14/2018 2100   PHURINE 6.0 08/14/2018 2100   GLUCOSEU NEGATIVE 08/14/2018 2100   GLUCOSEU NEGATIVE 10/22/2013 1621   HGBUR NEGATIVE 08/14/2018 2100   BILIRUBINUR SMALL (A) 08/14/2018 2100   KETONESUR 20 (A) 08/14/2018 2100   PROTEINUR NEGATIVE 08/14/2018 2100   UROBILINOGEN 1.0 03/27/2015 1534   NITRITE NEGATIVE 08/14/2018 2100   LEUKOCYTESUR NEGATIVE 08/14/2018 2100   Sepsis Labs: Invalid input(s): PROCALCITONIN, LACTICIDVEN  Recent Results (from the past 240 hour(s))  Blood Culture (routine x 2)     Status: None (Preliminary result)   Collection Time: 08/14/18  3:47 PM  Result Value Ref Range Status   Specimen Description   Final    BLOOD RIGHT ANTECUBITAL Performed at [facility] [address], [place], [place] [address]    Special Requests   Final    BOTTLES DRAWN AEROBIC AND ANAEROBIC Blood Culture adequate volume Performed at [facility] [address] [place], [place] [address]    Culture   Final    NO GROWTH < 12 HOURS Performed at [facility] [address] [place], [place] [address]     Report Status PENDING  Incomplete  Blood Culture (routine x 2)     Status: None (Preliminary result)   Collection Time: 08/14/18  3:52 PM  Result Value Ref Range Status   Specimen Description   Final    BLOOD LEFT ANTECUBITAL Performed at [facility] [address], [place], [place] [address]    Special Requests   Final    BOTTLES DRAWN AEROBIC AND ANAEROBIC Blood Culture adequate volume Performed at [facility] [address] [place], [place] [address]    Culture   Final    NO GROWTH < 12 HOURS Performed at [facility] [address] [place], [place] [address]    Report Status PENDING  Incomplete  Respiratory Panel by PCR     Status: Abnormal   Collection Time: 08/14/18  6:46 PM  Result Value Ref Range Status   Adenovirus NOT DETECTED NOT DETECTED Final   Coronavirus 229E NOT DETECTED NOT DETECTED Final    Comment: (NOTE) The Coronavirus on the Respiratory Panel, DOES NOT test for the novel  Coronavirus (2019 nCoV)    Coronavirus HKU1 NOT DETECTED NOT DETECTED Final   Coronavirus NL63 NOT DETECTED NOT DETECTED Final   Coronavirus OC43 NOT DETECTED NOT DETECTED Final   Metapneumovirus NOT DETECTED NOT DETECTED Final   Rhinovirus / Enterovirus DETECTED (A) NOT DETECTED Final   Influenza A NOT DETECTED NOT DETECTED Final   Influenza B NOT DETECTED NOT DETECTED Final   Parainfluenza Virus 1 NOT DETECTED NOT DETECTED Final   Parainfluenza Virus 2 NOT DETECTED NOT DETECTED Final   Parainfluenza Virus 3 NOT DETECTED NOT DETECTED Final   Parainfluenza Virus 4 NOT DETECTED NOT DETECTED Final   Respiratory Syncytial Virus NOT DETECTED NOT DETECTED Final   Bordetella pertussis NOT DETECTED NOT DETECTED Final   Chlamydophila pneumoniae NOT DETECTED NOT DETECTED Final   Mycoplasma pneumoniae NOT DETECTED NOT DETECTED Final    Comment: Performed at [facility], [place]. [address], [place], [place] [address]      Radiology  Studies: Dg Chest 2 View  Result Date: 08/14/2018 CLINICAL DATA:  Nonproductive cough. EXAM: CHEST - 2 VIEW COMPARISON:  July 16, 2017 FINDINGS: The heart size and mediastinal contours  are within normal limits. Both lungs are clear. The visualized skeletal structures are unremarkable. IMPRESSION: No active cardiopulmonary disease. Electronically Signed   By: Dorise Bullion III M.D   On: 08/14/2018 18:31   Vas [place] Lower Extremity Venous (dvt) (only [place])  Result Date: 08/14/2018  Lower Venous Study Indications: Swelling.  Limitations: Poor ultrasound/tissue interface. Performing Technologist: Oliver Hum RVT  Examination Guidelines: A complete evaluation includes B-mode imaging, spectral Doppler, color Doppler, and power Doppler as needed of all accessible portions of each vessel. Bilateral testing is considered an integral part of a complete examination. Limited examinations for reoccurring indications may be performed as noted.  Right Venous Findings: +---------+---------------+---------+-----------+----------+-------+          CompressibilityPhasicitySpontaneityPropertiesSummary +---------+---------------+---------+-----------+----------+-------+ CFV      Full           Yes      Yes                          +---------+---------------+---------+-----------+----------+-------+ SFJ      Full                                                 +---------+---------------+---------+-----------+----------+-------+ FV Prox  Full                                                 +---------+---------------+---------+-----------+----------+-------+ FV Mid                  Yes      Yes                          +---------+---------------+---------+-----------+----------+-------+ FV DistalFull                                                 +---------+---------------+---------+-----------+----------+-------+ PFV      Full                                                  +---------+---------------+---------+-----------+----------+-------+ POP      Full           Yes      Yes                          +---------+---------------+---------+-----------+----------+-------+ PTV      Full                                                 +---------+---------------+---------+-----------+----------+-------+ PERO     Full                                                 +---------+---------------+---------+-----------+----------+-------+  Left Venous Findings: +---+---------------+---------+-----------+----------+-------+    CompressibilityPhasicitySpontaneityPropertiesSummary +---+---------------+---------+-----------+----------+-------+ CFVFull           Yes      Yes                          +---+---------------+---------+-----------+----------+-------+    Summary: Right: There is no evidence of deep vein thrombosis in the lower extremity. However, portions of this examination were limited- see technologist comments above. No cystic structure found in the popliteal fossa. Left: No evidence of common femoral vein obstruction.  *See table(s) above for measurements and observations. Electronically signed by Ruta Hinds MD on 08/14/2018 at 4:59:14 PM.    Final     Marzetta Board, MD, PhD [facility]  Contact via  [email or web address]  [place] P: [phone]  F: [phone]

## 2018-08-15 NOTE — Progress Notes (Signed)
Patient complains of nausea.  States she cannot take Zofran because it "makes me loopy".  States she cannot drink the contrast for CT.  Dr. Cruzita Lederer notified via text page.

## 2018-08-16 DIAGNOSIS — E785 Hyperlipidemia, unspecified: Secondary | ICD-10-CM | POA: Diagnosis not present

## 2018-08-16 DIAGNOSIS — I110 Hypertensive heart disease with heart failure: Secondary | ICD-10-CM | POA: Diagnosis not present

## 2018-08-16 DIAGNOSIS — K219 Gastro-esophageal reflux disease without esophagitis: Secondary | ICD-10-CM | POA: Diagnosis not present

## 2018-08-16 DIAGNOSIS — Z8673 Personal history of transient ischemic attack (TIA), and cerebral infarction without residual deficits: Secondary | ICD-10-CM | POA: Diagnosis not present

## 2018-08-16 DIAGNOSIS — E876 Hypokalemia: Secondary | ICD-10-CM

## 2018-08-16 DIAGNOSIS — D849 Immunodeficiency, unspecified: Secondary | ICD-10-CM | POA: Diagnosis not present

## 2018-08-16 DIAGNOSIS — M329 Systemic lupus erythematosus, unspecified: Secondary | ICD-10-CM | POA: Diagnosis not present

## 2018-08-16 DIAGNOSIS — R2689 Other abnormalities of gait and mobility: Secondary | ICD-10-CM | POA: Diagnosis not present

## 2018-08-16 DIAGNOSIS — Z86718 Personal history of other venous thrombosis and embolism: Secondary | ICD-10-CM | POA: Diagnosis not present

## 2018-08-16 DIAGNOSIS — M109 Gout, unspecified: Secondary | ICD-10-CM | POA: Diagnosis present

## 2018-08-16 DIAGNOSIS — Z940 Kidney transplant status: Secondary | ICD-10-CM | POA: Diagnosis not present

## 2018-08-16 DIAGNOSIS — I2699 Other pulmonary embolism without acute cor pulmonale: Secondary | ICD-10-CM | POA: Diagnosis not present

## 2018-08-16 DIAGNOSIS — N3289 Other specified disorders of bladder: Secondary | ICD-10-CM | POA: Diagnosis not present

## 2018-08-16 DIAGNOSIS — Z7901 Long term (current) use of anticoagulants: Secondary | ICD-10-CM | POA: Diagnosis not present

## 2018-08-16 DIAGNOSIS — J069 Acute upper respiratory infection, unspecified: Secondary | ICD-10-CM | POA: Diagnosis present

## 2018-08-16 DIAGNOSIS — Z7401 Bed confinement status: Secondary | ICD-10-CM | POA: Diagnosis not present

## 2018-08-16 DIAGNOSIS — B348 Other viral infections of unspecified site: Secondary | ICD-10-CM | POA: Diagnosis not present

## 2018-08-16 DIAGNOSIS — Z86711 Personal history of pulmonary embolism: Secondary | ICD-10-CM | POA: Diagnosis not present

## 2018-08-16 DIAGNOSIS — Z79890 Hormone replacement therapy (postmenopausal): Secondary | ICD-10-CM | POA: Diagnosis not present

## 2018-08-16 DIAGNOSIS — E639 Nutritional deficiency, unspecified: Secondary | ICD-10-CM | POA: Diagnosis not present

## 2018-08-16 DIAGNOSIS — A419 Sepsis, unspecified organism: Secondary | ICD-10-CM | POA: Diagnosis not present

## 2018-08-16 DIAGNOSIS — B9789 Other viral agents as the cause of diseases classified elsewhere: Secondary | ICD-10-CM | POA: Diagnosis present

## 2018-08-16 DIAGNOSIS — M6281 Muscle weakness (generalized): Secondary | ICD-10-CM | POA: Diagnosis not present

## 2018-08-16 DIAGNOSIS — K920 Hematemesis: Secondary | ICD-10-CM | POA: Diagnosis present

## 2018-08-16 DIAGNOSIS — G459 Transient cerebral ischemic attack, unspecified: Secondary | ICD-10-CM | POA: Diagnosis not present

## 2018-08-16 DIAGNOSIS — I5032 Chronic diastolic (congestive) heart failure: Secondary | ICD-10-CM | POA: Diagnosis present

## 2018-08-16 DIAGNOSIS — Z79899 Other long term (current) drug therapy: Secondary | ICD-10-CM | POA: Diagnosis not present

## 2018-08-16 DIAGNOSIS — E1122 Type 2 diabetes mellitus with diabetic chronic kidney disease: Secondary | ICD-10-CM | POA: Diagnosis not present

## 2018-08-16 DIAGNOSIS — Z7952 Long term (current) use of systemic steroids: Secondary | ICD-10-CM | POA: Diagnosis not present

## 2018-08-16 DIAGNOSIS — M810 Age-related osteoporosis without current pathological fracture: Secondary | ICD-10-CM | POA: Diagnosis present

## 2018-08-16 DIAGNOSIS — Z8249 Family history of ischemic heart disease and other diseases of the circulatory system: Secondary | ICD-10-CM | POA: Diagnosis not present

## 2018-08-16 DIAGNOSIS — R4701 Aphasia: Secondary | ICD-10-CM | POA: Diagnosis not present

## 2018-08-16 DIAGNOSIS — I82401 Acute embolism and thrombosis of unspecified deep veins of right lower extremity: Secondary | ICD-10-CM | POA: Diagnosis not present

## 2018-08-16 DIAGNOSIS — R000 Tachycardia, unspecified: Secondary | ICD-10-CM | POA: Diagnosis present

## 2018-08-16 DIAGNOSIS — E039 Hypothyroidism, unspecified: Secondary | ICD-10-CM | POA: Diagnosis not present

## 2018-08-16 DIAGNOSIS — M2550 Pain in unspecified joint: Secondary | ICD-10-CM | POA: Diagnosis not present

## 2018-08-16 DIAGNOSIS — E119 Type 2 diabetes mellitus without complications: Secondary | ICD-10-CM | POA: Diagnosis not present

## 2018-08-16 DIAGNOSIS — R110 Nausea: Secondary | ICD-10-CM | POA: Diagnosis not present

## 2018-08-16 LAB — GLUCOSE, CAPILLARY
Glucose-Capillary: 109 mg/dL — ABNORMAL HIGH (ref 70–99)
Glucose-Capillary: 101 mg/dL — ABNORMAL HIGH (ref 70–99)
Glucose-Capillary: 125 mg/dL — ABNORMAL HIGH (ref 70–99)
Glucose-Capillary: 119 mg/dL — ABNORMAL HIGH (ref 70–99)

## 2018-08-16 LAB — TACROLIMUS LEVEL: Tacrolimus (FK506) - LabCorp: 1.6 ng/mL — ABNORMAL LOW (ref 2.0–20.0)

## 2018-08-16 LAB — BASIC METABOLIC PANEL
Anion gap: 11 (ref 5–15)
BUN: 7 mg/dL (ref 6–20)
CO2: 22 mmol/L (ref 22–32)
Calcium: 7.7 mg/dL — ABNORMAL LOW (ref 8.9–10.3)
Chloride: 105 mmol/L (ref 98–111)
Creatinine, Ser: 0.58 mg/dL (ref 0.44–1.00)
GFR calc Af Amer: >60 mL/min (ref >60)
GFR calc non Af Amer: >60 mL/min (ref >60)
Glucose, Bld: 105 mg/dL — ABNORMAL HIGH (ref 70–99)
Potassium: 3.2 mmol/L — ABNORMAL LOW (ref 3.5–5.1)
Sodium: 138 mmol/L (ref 135–145)

## 2018-08-16 LAB — CBC
HCT: 37.1 % (ref 36.0–46.0)
Hemoglobin: 11.5 g/dL — ABNORMAL LOW (ref 12.0–15.0)
MCH: 28.8 pg (ref 26.0–34.0)
MCHC: 31 g/dL (ref 30.0–36.0)
MCV: 92.8 fL (ref 80.0–100.0)
Platelets: 232 10*3/uL (ref 150–400)
RBC: 4 MIL/uL (ref 3.87–5.11)
RDW: 14.6 % (ref 11.5–15.5)
WBC: 10.7 10*3/uL — ABNORMAL HIGH (ref 4.0–10.5)
nRBC: 0 % (ref 0.0–0.2)

## 2018-08-16 LAB — PROTIME-INR
INR: 1.4 — ABNORMAL HIGH (ref 0.8–1.2)
Prothrombin Time: 16.8 seconds — ABNORMAL HIGH (ref 11.4–15.2)

## 2018-08-16 MED ORDER — OXYBUTYNIN CHLORIDE 5 MG PO TABS
5.0000 mg | ORAL_TABLET | Freq: Every day | ORAL | Status: DC | PRN
  Administered 2018-08-16: 5 mg via ORAL
  Filled 2018-08-16: qty 1

## 2018-08-16 MED ORDER — ONDANSETRON HCL 4 MG/2ML IJ SOLN
4.0000 mg | Freq: Four times a day (QID) | INTRAMUSCULAR | Status: DC | PRN
  Administered 2018-08-17 – 2018-08-22 (×12): 4 mg via INTRAVENOUS
  Filled 2018-08-16 (×12): qty 2

## 2018-08-16 MED ORDER — DIPHENHYDRAMINE HCL 50 MG/ML IJ SOLN
12.5000 mg | Freq: Four times a day (QID) | INTRAMUSCULAR | Status: DC | PRN
  Administered 2018-08-16 – 2018-08-21 (×5): 12.5 mg via INTRAVENOUS
  Filled 2018-08-16 (×5): qty 1

## 2018-08-16 MED ORDER — MORPHINE SULFATE (PF) 2 MG/ML IV SOLN
2.0000 mg | INTRAVENOUS | Status: DC | PRN
  Administered 2018-08-16 – 2018-08-19 (×10): 2 mg via INTRAVENOUS
  Filled 2018-08-16 (×11): qty 1

## 2018-08-16 MED ORDER — POTASSIUM CHLORIDE 10 MEQ/100ML IV SOLN
10.0000 meq | Freq: Once | INTRAVENOUS | Status: AC
  Administered 2018-08-16: 10 meq via INTRAVENOUS

## 2018-08-16 MED ORDER — WARFARIN SODIUM 5 MG PO TABS
5.0000 mg | ORAL_TABLET | Freq: Once | ORAL | Status: AC
  Administered 2018-08-16: 5 mg via ORAL
  Filled 2018-08-16: qty 1

## 2018-08-16 MED ORDER — SODIUM CHLORIDE 0.9 % IV SOLN
INTRAVENOUS | Status: DC
  Administered 2018-08-16: 15:00:00 via INTRAVENOUS

## 2018-08-16 MED ORDER — POTASSIUM CHLORIDE 10 MEQ/100ML IV SOLN
10.0000 meq | INTRAVENOUS | Status: AC
  Administered 2018-08-16 (×3): 10 meq via INTRAVENOUS
  Filled 2018-08-16 (×4): qty 100

## 2018-08-16 MED ORDER — BOOST / RESOURCE BREEZE PO LIQD CUSTOM
1.0000 | Freq: Two times a day (BID) | ORAL | Status: DC
  Administered 2018-08-16 – 2018-08-19 (×6): 1 via ORAL

## 2018-08-16 NOTE — Consult Note (Signed)
   [facility] CM Inpatient Consult   08/16/2018  ANISTEN TOMASSI 13-Dec-1960 [id number]  Patient screened for potential [facility] Care Management services due to unplanned readmission risk score of 26%, high.   Spoke with Ms. Gautier at bedside. Patient states that she does not have a primary care physician at this time. No [facility] CM Services eligibility at this time.  Netta Cedars, MSN, [facility] Liaison Nurse Mobile Phone [phone]  Toll free office [phone]

## 2018-08-16 NOTE — Progress Notes (Signed)
Initial Nutrition Assessment  DOCUMENTATION CODES:   Not applicable  INTERVENTION:   Boost Breeze po TID, each supplement provides 250 kcal and 9 grams of protein  NUTRITION DIAGNOSIS:   Inadequate oral intake related to vomiting, nausea as evidenced by per patient/family report.  GOAL:   Patient will meet greater than or equal to 90% of their needs   MONITOR:   PO intake, Supplement acceptance, Diet advancement, Labs  REASON FOR ASSESSMENT:   Malnutrition Screening Tool    ASSESSMENT:   Patient with PMH significant for CVA, CHF, DM, DVT, PE, HTN, HLD, lupus, and s/p kidney transplant on chronic immunosuppressive therapy. Presents this admission with sepsis suspected to be from upper respiratory illness and possible GI bleed.    Pt denies having a loss in appetite PTA. States she was enrolled in [facility] program in [place] two months ago in attempts to lose weight. She typically eats two balanced meals daily with snacks. Pt was eating well this admission until she developed episodes of vomiting yesterday evening. She was placed on a clear liquid diet. Discussed the importance of protein while on a clear liquid diet. Pt amendable to Boost Breeze.   Pt denies any recent wt loss PTA. Unsure of her UBW. Records indicate pt has maintained her weight of 175-185c lb over the last year. Nutrition-Focused physical exam completed.   Medications reviewed and include: calcitriol, SS novolog, prednisone, NS @ 125 ml/hr Labs reviewed: K 3.2 (L)   NUTRITION - FOCUSED PHYSICAL EXAM:    Most Recent Value  Orbital Region  No depletion  Upper Arm Region  No depletion  Thoracic and Lumbar Region  Unable to assess  Buccal Region  No depletion  Temple Region  No depletion  Clavicle Bone Region  No depletion  Clavicle and Acromion Bone Region  No depletion  Scapular Bone Region  Unable to assess  Dorsal Hand  No depletion  Patellar Region  No depletion  Anterior Thigh Region  No  depletion  Posterior Calf Region  No depletion  Edema (RD Assessment)  None  Hair  Reviewed  Eyes  Reviewed  Mouth  Reviewed  Skin  Reviewed  Nails  Reviewed     Diet Order:   Diet Order            Diet clear liquid Room service appropriate? Yes; Fluid consistency: Thin  Diet effective now              EDUCATION NEEDS:   Education needs have been addressed  Skin:  Skin Assessment: Reviewed RN Assessment   Last BM:  3/9  Height:   Ht Readings from Last 1 Encounters:  08/14/18 5\' 4"  (1.626 m)    Weight:   Wt Readings from Last 1 Encounters:  08/14/18 83.4 kg    Ideal Body Weight:  54.5 kg  BMI:  Body mass index is 31.55 kg/m.  Estimated Nutritional Needs:   Kcal:  1650-1850 kcal  Protein:  80-95 grams  Fluid:  >/= 1.6 L/day    Mariana Single RD, LDN Clinical Nutrition Pager # - [phone]

## 2018-08-16 NOTE — Progress Notes (Signed)
[place] for Warfarin (on warfarin PTA) Indication: H/O PE and DVT  Allergies  Allergen Reactions  . Oxycodone-Acetaminophen Shortness Of Breath and Nausea Only  . Propoxyphene Nausea Only and Shortness Of Breath    States takes tylenol at home  . Propoxyphene N-Acetaminophen Shortness Of Breath and Nausea Only  . Sulfonamide Derivatives Shortness Of Breath and Nausea Only  . Codeine Nausea Only  . Hydrocodone-Acetaminophen   . Hydromorphone   . Other Other (See Comments)    No blood, Jehovaeh Witness   . Oxycodone   . Sulfamethoxazole   . Tape     Redness**PAPER TAPE OK**  . Gabapentin Anxiety    twitching  . Latex Rash  . Metoprolol Rash  . Morphine And Related Rash    IV site on arm is red, patient reports this is improving.  NO shortness of breath reported.  . Rosiglitazone Rash    Patient Measurements: Height: 5\' 4"  (162.6 cm) Weight: 183 lb 12.8 oz (83.4 kg) IBW/kg (Calculated) : 54.7   Vital Signs: Temp: 99.8 F (37.7 C) (03/11 0618) Temp Source: Oral (03/11 0618) BP: 147/91 (03/11 0618) Pulse Rate: 110 (03/11 0618)  Labs: Recent Labs    08/14/18 1324 08/14/18 1653 08/15/18 0454 08/16/18 0433  HGB 13.6  --   --  11.5*  HCT 45.1  --   --  37.1  PLT 252  --   --  232  LABPROT  --  15.4* 16.3* 16.8*  INR  --  1.2 1.3* 1.4*  CREATININE 0.77  --  0.60 0.58    Estimated Creatinine Clearance: 81.1 mL/min (by C-G formula based on SCr of 0.58 mg/dL).   Medical History: Past Medical History:  Diagnosis Date  . Anxiety   . Candida esophagitis ([facility]) 11/12/2014  . CEREBROVASCULAR ACCIDENT, ACUTE 04/15/2010  . CLOSTRIDIUM DIFFICILE COLITIS, HX OF 08/21/2007  . CONGESTIVE HEART FAILURE 08/21/2007  . Current use of long term anticoagulation    Dr. Andree Elk, [facility]  . CVA 04/17/2010  . Depression    Dr. Andree Elk, [place], TYPE II 08/21/2007  . DVT, HX OF 08/21/2007  . GERD 08/21/2007  . GOUT  08/21/2007  . History of stroke with residual effects   . HYPERLIPIDEMIA 08/21/2007  . HYPERTENSION 08/21/2007   Dr. Andree Elk, [place], HX OF 08/22/2007   s/p renal transplant-Dr. Andree Elk, [place] 08/21/2007  . OSTEOPOROSIS 08/21/2007   Rheumatol at [facility]  . Pulmonary embolism ([facility]) 07/16/2010  . Renal failure   . RENAL INSUFFICIENCY 08/21/2007  . Right sided weakness   . Steroid-induced hyperglycemia 11/09/2014  . Tachycardia   . THYROID NODULE, LEFT 04/10/2009    Medications:  PTA Warfarin regimen:  2.5mg  daily except 5mg  on Mon & Fri  Assessment: 58 y.o. female admitted on 08/14/2018 with sepsis.  PMH significant for lupus, kidney transplant, h/o PE & DVT. Pharmacy consulted to continue dosing warfarin on admission.  Today, 08/16/2018  INR upon admission = 1.2 (INR 3 weeks ago = 2.0), reports last dose taken 08/11/2018  INR today remains subtherapeutic 1.4  CBC: Hgb 11.5, Plts wnl  Drug interactions: broad spectrum abx (cefepime/flagyl) can increase warfarin sensitivity  Significant events: 3/10: coffee ground emesis, gastric contents heme (+), warfarin dose held 3/11: discussed above events with Dr. Louanne Belton, ok to proceed with warfarin today since Hgb did not significantly drop; will not use lovenox bridging at this time  Goal of  Therapy:  INR 2-3   Plan:   Warfarin 5mg  po x 1 dose  Check daily PT/INR  Peggyann Juba, PharmD, BCPS Pager: [phone] 08/16/2018,10:10 AM

## 2018-08-16 NOTE — Progress Notes (Addendum)
PROGRESS NOTE  Connie Ruiz VOZ:[id number] DOB: 04/03/1961 DOA: 08/14/2018 PCP: Patient, No Pcp Per   LOS: 0 days   Brief narrative: 58 year old lady with history of prior CVA, diastolic CHF, type 2 diabetes mellitus, prior DVT and PE, hypertension, hyperlipidemia, lupus, kidney transplant on chronic immunosuppressive therapy, who came to the hospital with flulike symptoms.  She reported 3-day history of fevers, nonproductive cough, runny nose and sore throat.  She also had been having generalized abdominal pain along with nausea, vomiting and diarrhea.  No recent travel or sick contacts.  Subjective: Patient states that she is not feeling well today.  Complains of abdominal pain, nausea vomiting and is on supplemental oxygen with nasal cannula..  Assessment/Plan:  Principal Problem:   Sepsis ([place]) Active Problems:   LUPUS   Pulmonary embolism ([facility])   Hypokalemia   Type 2 diabetes mellitus ([place])  Sepsis present on admission.  On cefepime, vancomycin and metronidazole.  Will discontinue vancomycin today.  Patient does have history of immune compromised status, so will continue on broad-spectrum antibiotic.  Respiratory viral panel was positive for rhinovirus.  Sepsis due to upper respiratory illness/rhinovirus infection.  Blood cultures negative so far.  WBC of 10.7 today.  Hypokalemia.  Will replace potassium aggressively through IV.  Patient has had vomiting so we will change to IV for now.  Abdominal pain with vomiting. -Intermittent abdominal pain for several years.  CT scan of the abdomen showed some thickening of the jejunal area.  With IV fluid hydration.  Continue clear liquids for now.  Hypothyroidism TSH was elevated at 64.    Patient might need the increased dose of Synthroid on discharge.  Will need to hold off with calcium to improve absorption..  Patient will need to check TSH in 3 to 4 weeks as outpatient.    Vomiting with Gastroccult blood positive.  No further episodes  of vomiting or hematemesis.  Patient has history of DVT, pulmonary embolism, history of stroke and requires Coumadin which was subtherapeutic to start with.  Spoke with pharmacy about it.  We will continue with Coumadin for now.  Protonix has been started.  Will discontinue Motrin the patient was on.  Will closely monitor hemoglobin.  If the hemoglobin continues to trend down, will hold off anticoagulation and consult GI.  Continue with IV fluid hydration.  History of PE and DVT -Patient is on Coumadin at home, she is subtherapeutic here at 1.2.  DVT studies in the ER were negative given right lower extremity swelling. Continue Coumadin per pharmacy  History of lupus, renal transplant on chronic immunosuppressive therapy -CellCept and tacrolimus were held on admission, check tacrolimus level still pending. -Continue home prednisone 5 mg daily.  Consider restarting CellCept and tacrolimus by tomorrow if cultures are negative and infection risk is low  Type 2 diabetes mellitus -Continue sliding scale, Accu-Cheks diabetic diet.  Closely monitor.  VTE Prophylaxis: Warfarin  Code Status: Full code  Family Communication: No one  available at bedside  Disposition Plan: Home likely in 2 to 3 days.  We will change the patient's status to inpatient at this time due to need for IV fluids, supportive care including antiemetics, monitoring for hemoglobin for possible GI bleed, IV fluid hydration, potassium supplements through the IV route.   Consultants:  None  Procedures:  None  Antibiotics: Anti-infectives (From admission, onward)   Start     Dose/Rate Route Frequency Ordered Stop   08/15/18 1900  vancomycin (VANCOCIN) 1,250 mg in sodium chloride 0.9 %  250 mL IVPB  Status:  Discontinued     1,250 mg 166.7 mL/hr over 90 Minutes Intravenous Every 24 hours 08/14/18 2055 08/15/18 0921   08/15/18 0200  metroNIDAZOLE (FLAGYL) IVPB 500 mg     500 mg 100 mL/hr over 60 Minutes Intravenous Every 8  hours 08/14/18 2039     08/15/18 0100  ceFEPIme (MAXIPIME) 1 g in sodium chloride 0.9 % 100 mL IVPB     1 g 200 mL/hr over 30 Minutes Intravenous Every 8 hours 08/14/18 2055     08/14/18 1600  ceFEPIme (MAXIPIME) 2 g in sodium chloride 0.9 % 100 mL IVPB     2 g 200 mL/hr over 30 Minutes Intravenous  Once 08/14/18 1548 08/14/18 1752   08/14/18 1600  metroNIDAZOLE (FLAGYL) IVPB 500 mg     500 mg 100 mL/hr over 60 Minutes Intravenous  Once 08/14/18 1548 08/14/18 1853   08/14/18 1600  vancomycin (VANCOCIN) IVPB 1000 mg/200 mL premix  Status:  Discontinued     1,000 mg 200 mL/hr over 60 Minutes Intravenous  Once 08/14/18 1548 08/14/18 1553   08/14/18 1600  vancomycin (VANCOCIN) 1,750 mg in sodium chloride 0.9 % 500 mL IVPB     1,750 mg 250 mL/hr over 120 Minutes Intravenous  Once 08/14/18 1553 08/14/18 2154      Objective: Vitals:   08/15/18 2149 08/16/18 0618  BP: (!) 156/104 (!) 147/91  Pulse: 97 (!) 110  Resp: 12 16  Temp: 98.5 F (36.9 C) 99.8 F (37.7 C)  SpO2: 100% 95%    Intake/Output Summary (Last 24 hours) at 08/16/2018 1303 Last data filed at 08/16/2018 0600 Gross per 24 hour  Intake 1048.64 ml  Output 1550 ml  Net -501.36 ml   Filed Weights   08/14/18 1251 08/14/18 2248  Weight: 83.5 kg 83.4 kg   Body mass index is 31.55 kg/m.   Physical Exam: GENERAL: Patient is alert awake and oriented. Not in obvious distress. HENT: No scleral pallor or icterus. Pupils equally reactive to light. Oral mucosa is moist NECK: is supple, no palpable thyroid enlargement. CHEST: Clear to auscultation. No crackles or wheezes. Non tender on palpation. Diminished breath sounds bilaterally. CVS: S1 and S2 heard, no murmur. Regular rate and rhythm. No pericardial rub. ABDOMEN: Soft, non-tender, bowel sounds are present. No palpable hepato-splenomegaly. EXTREMITIES: No edema. CNS: Cranial nerves are intact. No focal motor or sensory deficits. SKIN: warm and dry without rashes.  Data  Review: I have personally reviewed the following laboratory data and studies,  CBC: Recent Labs  Lab 08/14/18 1324 08/16/18 0433  WBC 10.4 10.7*  HGB 13.6 11.5*  HCT 45.1 37.1  MCV 93.6 92.8  PLT 252 459   Basic Metabolic Panel: Recent Labs  Lab 08/14/18 1324 08/14/18 2100 08/15/18 0454 08/16/18 0433  NA 140  --  142 138  K 3.2*  --  3.6 3.2*  CL 106  --  114* 105  CO2 25  --  23 22  GLUCOSE 135*  --  106* 105*  BUN 18  --  15 7  CREATININE 0.77  --  0.60 0.58  CALCIUM 8.9  --  8.0* 7.7*  MG  --  1.6*  --   --    Liver Function Tests: Recent Labs  Lab 08/14/18 1324  AST 16  ALT 9  ALKPHOS 76  BILITOT 1.6*  PROT 6.9  ALBUMIN 3.6   Recent Labs  Lab 08/14/18 1324  LIPASE 30   No  results for input(s): AMMONIA in the last 168 hours. Cardiac Enzymes: No results for input(s): CKTOTAL, CKMB, CKMBINDEX, TROPONINI in the last 168 hours. BNP (last 3 results) No results for input(s): BNP in the last 8760 hours.  ProBNP (last 3 results) No results for input(s): PROBNP in the last 8760 hours.  CBG: Recent Labs  Lab 08/15/18 1235 08/15/18 1635 08/15/18 2146 08/16/18 0754 08/16/18 1206  GLUCAP 110* 143* 114* 109* 101*   Recent Results (from the past 240 hour(s))  Blood Culture (routine x 2)     Status: None (Preliminary result)   Collection Time: 08/14/18  3:47 PM  Result Value Ref Range Status   Specimen Description   Final    BLOOD RIGHT ANTECUBITAL Performed at [facility] [address], [place], [place] [address]    Special Requests   Final    BOTTLES DRAWN AEROBIC AND ANAEROBIC Blood Culture adequate volume Performed at [facility] [address], [place], [place] [address]    Culture   Final    NO GROWTH 2 DAYS Performed at [facility] [address] [place], [place] [address]    Report Status PENDING  Incomplete  Blood Culture (routine x 2)     Status: None (Preliminary result)   Collection  Time: 08/14/18  3:52 PM  Result Value Ref Range Status   Specimen Description   Final    BLOOD LEFT ANTECUBITAL Performed at [facility] [address], [place], [place] [address]    Special Requests   Final    BOTTLES DRAWN AEROBIC AND ANAEROBIC Blood Culture adequate volume Performed at [facility] [address] [place], [place] [address]    Culture   Final    NO GROWTH 2 DAYS Performed at [facility] [address], [place], [place] [address]    Report Status PENDING  Incomplete  Respiratory Panel by PCR     Status: Abnormal   Collection Time: 08/14/18  6:46 PM  Result Value Ref Range Status   Adenovirus NOT DETECTED NOT DETECTED Final   Coronavirus 229E NOT DETECTED NOT DETECTED Final    Comment: (NOTE) The Coronavirus on the Respiratory Panel, DOES NOT test for the novel  Coronavirus (2019 nCoV)    Coronavirus HKU1 NOT DETECTED NOT DETECTED Final   Coronavirus NL63 NOT DETECTED NOT DETECTED Final   Coronavirus OC43 NOT DETECTED NOT DETECTED Final   Metapneumovirus NOT DETECTED NOT DETECTED Final   Rhinovirus / Enterovirus DETECTED (A) NOT DETECTED Final   Influenza A NOT DETECTED NOT DETECTED Final   Influenza B NOT DETECTED NOT DETECTED Final   Parainfluenza Virus 1 NOT DETECTED NOT DETECTED Final   Parainfluenza Virus 2 NOT DETECTED NOT DETECTED Final   Parainfluenza Virus 3 NOT DETECTED NOT DETECTED Final   Parainfluenza Virus 4 NOT DETECTED NOT DETECTED Final   Respiratory Syncytial Virus NOT DETECTED NOT DETECTED Final   Bordetella pertussis NOT DETECTED NOT DETECTED Final   Chlamydophila pneumoniae NOT DETECTED NOT DETECTED Final   Mycoplasma pneumoniae NOT DETECTED NOT DETECTED Final    Comment: Performed at [facility], [place]. [address], [place], [place] [address]     Studies: Dg Chest 2 View  Result Date: 08/14/2018 CLINICAL DATA:  Nonproductive cough. EXAM: CHEST - 2 VIEW COMPARISON:  July 16, 2017  FINDINGS: The heart size and mediastinal contours are within normal limits. Both lungs are clear. The visualized skeletal structures are  unremarkable. IMPRESSION: No active cardiopulmonary disease. Electronically Signed   By: Dorise Bullion III M.D   On: 08/14/2018 18:31   Ct Abdomen Pelvis W Contrast  Result Date: 08/15/2018 CLINICAL DATA:  Respiratory illness with nausea and vomiting EXAM: CT ABDOMEN AND PELVIS WITH CONTRAST TECHNIQUE: Multidetector CT imaging of the abdomen and pelvis was performed using the standard protocol following bolus administration of intravenous contrast. CONTRAST:  78mL OMNIPAQUE IOHEXOL 300 MG/ML SOLN, 50mL OMNIPAQUE IOHEXOL 300 MG/ML SOLN COMPARISON:  CTA abdomen pelvis 06/15/2015 FINDINGS: LOWER CHEST: Right basilar consolidation or atelectasis. HEPATOBILIARY: The hepatic contours and density are normal. There is no intra- or extrahepatic biliary dilatation. The gallbladder is normal. PANCREAS: The pancreatic parenchymal contours are normal and there is no ductal dilatation. There is no peripancreatic fluid collection. SPLEEN: Normal. ADRENALS/URINARY TRACT: --Adrenal glands: Normal. --Right kidney/ureter: Severely atrophic right kidney --Left kidney/ureter: Severely atrophic left kidney --right lower quadrant renal transplant: Normal appearance without hydronephrosis. --Urinary bladder: Normal for degree of distention STOMACH/BOWEL: --Stomach/Duodenum: There is no hiatal hernia or other gastric abnormality. The duodenal course and caliber are normal. --Small bowel: There is moderate wall thickening of the proximal jejunum. --Colon: No focal abnormality. --Appendix: Normal. VASCULAR/LYMPHATIC: There is aortic atherosclerosis without hemodynamically significant stenosis. The portal vein, splenic vein, superior mesenteric vein and IVC are patent. No abdominal or pelvic lymphadenopathy. REPRODUCTIVE: Normal uterus. No adnexal mass. MUSCULOSKELETAL. No bony spinal canal stenosis or  focal osseous abnormality. OTHER: None. IMPRESSION: 1. Moderate wall thickening of the proximal jejunum, which may indicate acute enteritis. 2. Right basilar consolidation or atelectasis. 3.  Aortic atherosclerosis (ICD10-I70.0). Electronically Signed   By: Ulyses Jarred M.D.   On: 08/15/2018 16:14   Vas [place] Lower Extremity Venous (dvt) (only [facility])  Result Date: 08/14/2018  Lower Venous Study Indications: Swelling.  Limitations: Poor ultrasound/tissue interface. Performing Technologist: Oliver Hum RVT  Examination Guidelines: A complete evaluation includes B-mode imaging, spectral Doppler, color Doppler, and power Doppler as needed of all accessible portions of each vessel. Bilateral testing is considered an integral part of a complete examination. Limited examinations for reoccurring indications may be performed as noted.  Right Venous Findings: +---------+---------------+---------+-----------+----------+-------+          CompressibilityPhasicitySpontaneityPropertiesSummary +---------+---------------+---------+-----------+----------+-------+ CFV      Full           Yes      Yes                          +---------+---------------+---------+-----------+----------+-------+ SFJ      Full                                                 +---------+---------------+---------+-----------+----------+-------+ FV Prox  Full                                                 +---------+---------------+---------+-----------+----------+-------+ FV Mid                  Yes      Yes                          +---------+---------------+---------+-----------+----------+-------+ FV DistalFull                                                 +---------+---------------+---------+-----------+----------+-------+  PFV      Full                                                 +---------+---------------+---------+-----------+----------+-------+ POP      Full           Yes      Yes                           +---------+---------------+---------+-----------+----------+-------+ PTV      Full                                                 +---------+---------------+---------+-----------+----------+-------+ PERO     Full                                                 +---------+---------------+---------+-----------+----------+-------+  Left Venous Findings: +---+---------------+---------+-----------+----------+-------+    CompressibilityPhasicitySpontaneityPropertiesSummary +---+---------------+---------+-----------+----------+-------+ CFVFull           Yes      Yes                          +---+---------------+---------+-----------+----------+-------+    Summary: Right: There is no evidence of deep vein thrombosis in the lower extremity. However, portions of this examination were limited- see technologist comments above. No cystic structure found in the popliteal fossa. Left: No evidence of common femoral vein obstruction.  *See table(s) above for measurements and observations. Electronically signed by Ruta Hinds MD on 08/14/2018 at 4:59:14 PM.    Final     Scheduled Meds: . atorvastatin  40 mg Oral q1800  . calcitRIOL  0.25 mcg Oral QODAY  . feeding supplement  1 Container Oral BID BM  . insulin aspart  0-9 Units Subcutaneous TID WC  . levothyroxine  150 mcg Oral Q0600  . pantoprazole (PROTONIX) IV  40 mg Intravenous Q12H  . predniSONE  5 mg Oral Q breakfast  . sucralfate  1 g Oral TID WC & HS  . warfarin  5 mg Oral Once  . warfarin  5 mg Oral ONCE-1800  . Warfarin - Pharmacist Dosing Inpatient   Does not apply q1800    Continuous Infusions: . ceFEPime (MAXIPIME) IV 1 g (08/16/18 1013)  . metronidazole 500 mg (08/16/18 1157)     Flora Lipps, MD  [facility] 08/16/2018

## 2018-08-16 NOTE — Progress Notes (Signed)
Pt c/o right sided abdominal pain rating it a 9/10. States Tramadol is not relieving pain. States she has had Morphine in the past, but it causes a rash and they gave her benadryl when taking it. Notified Md. Eulas Post, RN

## 2018-08-17 LAB — CBC
HCT: 36.3 % (ref 36.0–46.0)
Hemoglobin: 11.1 g/dL — ABNORMAL LOW (ref 12.0–15.0)
MCH: 28.7 pg (ref 26.0–34.0)
MCHC: 30.6 g/dL (ref 30.0–36.0)
MCV: 93.8 fL (ref 80.0–100.0)
Platelets: 250 K/uL (ref 150–400)
RBC: 3.87 MIL/uL (ref 3.87–5.11)
RDW: 14.6 % (ref 11.5–15.5)
WBC: 10.6 K/uL — ABNORMAL HIGH (ref 4.0–10.5)
nRBC: 0 % (ref 0.0–0.2)

## 2018-08-17 LAB — PROTIME-INR
INR: 1.6 — ABNORMAL HIGH (ref 0.8–1.2)
Prothrombin Time: 18.7 s — ABNORMAL HIGH (ref 11.4–15.2)

## 2018-08-17 LAB — COMPREHENSIVE METABOLIC PANEL
ALT: 7 U/L (ref 0–44)
AST: 12 U/L — ABNORMAL LOW (ref 15–41)
Albumin: 2.6 g/dL — ABNORMAL LOW (ref 3.5–5.0)
Alkaline Phosphatase: 50 U/L (ref 38–126)
Anion gap: 6 (ref 5–15)
BUN: 6 mg/dL (ref 6–20)
CO2: 26 mmol/L (ref 22–32)
Calcium: 7.8 mg/dL — ABNORMAL LOW (ref 8.9–10.3)
Chloride: 108 mmol/L (ref 98–111)
Creatinine, Ser: 0.57 mg/dL (ref 0.44–1.00)
GFR calc Af Amer: >60 mL/min (ref >60)
GFR calc non Af Amer: >60 mL/min (ref >60)
Glucose, Bld: 96 mg/dL (ref 70–99)
Potassium: 3.3 mmol/L — ABNORMAL LOW (ref 3.5–5.1)
Sodium: 140 mmol/L (ref 135–145)
Total Bilirubin: 0.7 mg/dL (ref 0.3–1.2)
Total Protein: 5.4 g/dL — ABNORMAL LOW (ref 6.5–8.1)

## 2018-08-17 LAB — GLUCOSE, CAPILLARY
Glucose-Capillary: 84 mg/dL (ref 70–99)
Glucose-Capillary: 103 mg/dL — ABNORMAL HIGH (ref 70–99)
Glucose-Capillary: 128 mg/dL — ABNORMAL HIGH (ref 70–99)
Glucose-Capillary: 87 mg/dL (ref 70–99)

## 2018-08-17 LAB — MAGNESIUM: Magnesium: 1.5 mg/dL — ABNORMAL LOW (ref 1.7–2.4)

## 2018-08-17 MED ORDER — PANTOPRAZOLE SODIUM 40 MG PO TBEC
40.0000 mg | DELAYED_RELEASE_TABLET | Freq: Two times a day (BID) | ORAL | Status: DC
  Administered 2018-08-17 – 2018-08-22 (×10): 40 mg via ORAL
  Filled 2018-08-17 (×10): qty 1

## 2018-08-17 MED ORDER — SODIUM CHLORIDE 0.45 % IV SOLN
INTRAVENOUS | Status: DC
  Administered 2018-08-17 – 2018-08-20 (×5): via INTRAVENOUS
  Filled 2018-08-17 (×7): qty 1000

## 2018-08-17 MED ORDER — CEFDINIR 300 MG PO CAPS
300.0000 mg | ORAL_CAPSULE | Freq: Two times a day (BID) | ORAL | Status: AC
  Administered 2018-08-17 – 2018-08-21 (×10): 300 mg via ORAL
  Filled 2018-08-17 (×10): qty 1

## 2018-08-17 MED ORDER — MYCOPHENOLATE MOFETIL 250 MG PO CAPS
250.0000 mg | ORAL_CAPSULE | Freq: Two times a day (BID) | ORAL | Status: DC
  Administered 2018-08-17 – 2018-08-22 (×11): 250 mg via ORAL
  Filled 2018-08-17 (×12): qty 1

## 2018-08-17 MED ORDER — WARFARIN SODIUM 5 MG PO TABS
5.0000 mg | ORAL_TABLET | Freq: Once | ORAL | Status: AC
  Administered 2018-08-17: 5 mg via ORAL
  Filled 2018-08-17: qty 1

## 2018-08-17 MED ORDER — TACROLIMUS 1 MG PO CAPS
1.0000 mg | ORAL_CAPSULE | Freq: Every day | ORAL | Status: DC
  Administered 2018-08-17 – 2018-08-21 (×5): 1 mg via ORAL
  Filled 2018-08-17 (×5): qty 1

## 2018-08-17 MED ORDER — MAGNESIUM SULFATE 2 GM/50ML IV SOLN
2.0000 g | Freq: Once | INTRAVENOUS | Status: AC
  Administered 2018-08-17: 2 g via INTRAVENOUS
  Filled 2018-08-17: qty 50

## 2018-08-17 MED ORDER — SODIUM CHLORIDE 0.45 % IV SOLN: INTRAVENOUS | Status: DC

## 2018-08-17 MED ORDER — TACROLIMUS 1 MG PO CAPS
2.0000 mg | ORAL_CAPSULE | Freq: Every day | ORAL | Status: DC
  Administered 2018-08-17 – 2018-08-22 (×6): 2 mg via ORAL
  Filled 2018-08-17 (×6): qty 2

## 2018-08-17 NOTE — Progress Notes (Signed)
[facility] for Warfarin (on warfarin PTA) Indication: H/O PE and DVT  Allergies  Allergen Reactions  . Oxycodone-Acetaminophen Shortness Of Breath and Nausea Only  . Propoxyphene Nausea Only and Shortness Of Breath    States takes tylenol at home  . Propoxyphene N-Acetaminophen Shortness Of Breath and Nausea Only  . Sulfonamide Derivatives Shortness Of Breath and Nausea Only  . Codeine Nausea Only  . Hydrocodone-Acetaminophen   . Hydromorphone   . Other Other (See Comments)    No blood, Jehovaeh Witness   . Oxycodone   . Sulfamethoxazole   . Tape     Redness**PAPER TAPE OK**  . Gabapentin Anxiety    twitching  . Latex Rash  . Metoprolol Rash  . Morphine And Related Rash    IV site on arm is red, patient reports this is improving.  NO shortness of breath reported.  . Rosiglitazone Rash    Patient Measurements: Height: 5\' 4"  (162.6 cm) Weight: 183 lb 12.8 oz (83.4 kg) IBW/kg (Calculated) : 54.7   Vital Signs: Temp: 98.4 F (36.9 C) (03/12 0422) Temp Source: Oral (03/12 0422) BP: 153/93 (03/12 0422) Pulse Rate: 99 (03/12 0422)  Labs: Recent Labs    08/14/18 1324  08/15/18 0454 08/16/18 0433 08/17/18 0338  HGB 13.6  --   --  11.5* 11.1*  HCT 45.1  --   --  37.1 36.3  PLT 252  --   --  232 250  LABPROT  --    < > 16.3* 16.8* 18.7*  INR  --    < > 1.3* 1.4* 1.6*  CREATININE 0.77  --  0.60 0.58 0.57   < > = values in this interval not displayed.    Estimated Creatinine Clearance: 81.1 mL/min (by C-G formula based on SCr of 0.57 mg/dL).   Medical History: Past Medical History:  Diagnosis Date  . Anxiety   . Candida esophagitis ([place]) 11/12/2014  . CEREBROVASCULAR ACCIDENT, ACUTE 04/15/2010  . CLOSTRIDIUM DIFFICILE COLITIS, HX OF 08/21/2007  . CONGESTIVE HEART FAILURE 08/21/2007  . Current use of long term anticoagulation    Dr. Andree Elk, [facility]  . CVA 04/17/2010  . Depression    Dr. Andree Elk, [place], TYPE II 08/21/2007  . DVT, HX OF 08/21/2007  . GERD 08/21/2007  . GOUT 08/21/2007  . History of stroke with residual effects   . HYPERLIPIDEMIA 08/21/2007  . HYPERTENSION 08/21/2007   Dr. Andree Elk, [place], HX OF 08/22/2007   s/p renal transplant-Dr. Andree Elk, [place] 08/21/2007  . OSTEOPOROSIS 08/21/2007   Rheumatol at [facility]  . Pulmonary embolism ([place]) 07/16/2010  . Renal failure   . RENAL INSUFFICIENCY 08/21/2007  . Right sided weakness   . Steroid-induced hyperglycemia 11/09/2014  . Tachycardia   . THYROID NODULE, LEFT 04/10/2009    Medications:  PTA Warfarin regimen:  2.5mg  daily except 5mg  on Mon & Fri  Assessment: 58 y.o. female admitted on 08/14/2018 with sepsis.  PMH significant for lupus, kidney transplant, h/o PE & DVT. Pharmacy consulted to continue dosing warfarin on admission.  Today, 08/17/2018  INR upon admission = 1.2 (INR 3 weeks ago = 2.0), reports last dose taken 08/11/2018  INR today remains subtherapeutic but rising to goal- 1.6  CBC: Hgb 11.1, Plts wnl  Drug interactions: broad spectrum abx (cefepime/flagyl) can increase warfarin sensitivity  CLD ordered- no intake charted  Significant events: 3/10: coffee ground emesis, gastric contents heme (+),  warfarin dose held 3/11: discussed above events with Dr. Louanne Belton, ok to proceed with warfarin today since Hgb did not significantly drop; will not use lovenox bridging at this time  Goal of Therapy:  INR 2-3   Plan:   Warfarin 5mg  po x 1 dose  Check daily PT/INR  Netta Cedars, PharmD, BCPS Pager: [phone] 08/17/2018,7:42 AM

## 2018-08-17 NOTE — Progress Notes (Signed)
PT Cancellation Note  Patient Details Name: ISHITHA ROPER MRN: [id number] DOB: [birth year]   Cancelled Treatment:    Reason Eval/Treat Not Completed: Fatigue/lethargy limiting ability to participate - Upon first arrival to pt room around 1200 today, pt stating she is too fatigued to participate in PT. Upon second check on pt, pt sleeping. PT to check back as schedule allows.  Julien Girt, PT Acute Rehabilitation Services Pager [phone]  Office [phone]    [place] 08/17/2018, 2:27 PM

## 2018-08-17 NOTE — TOC Initial Note (Signed)
Transition of Care [facility]) - Initial/Assessment Note    Patient Details  Name: Connie Ruiz MRN: [id number] Date of Birth: 1960/11/21  [facility] CM/SW Contact:    Purcell Mouton, RN Phone Number: 08/17/2018, 12:46 PM  Clinical Narrative:         Pt admitted with Sepsis, LUPUS           Expected Discharge Plan: Home w Home Health Services Barriers to Discharge: Other (comment)(Pt at present time declined HH, however may need HHPT related to pt's decondition)   Patient Goals and CMS Choice Pt's plan and goals are to feel better and able to do ADL's     Choice offered to / list presented to : Patient  Expected Discharge Plan and Services Expected Discharge Plan: [place] Discharge Planning Services: CM Consult   Living arrangements for the past 2 months: Apartment Expected Discharge Date: (unknown)                        Prior Living Arrangements/Services Living arrangements for the past 2 months: Apartment Lives with:: Self              Current home services: Homehealth aide([facility])    Activities of Daily Living Home Assistive Devices/Equipment: Cane (specify quad or straight), Bedside commode/3-in-1 ADL Screening (condition at time of admission) Patient's cognitive ability adequate to safely complete daily activities?: Yes Is the patient deaf or have difficulty hearing?: No Does the patient have difficulty seeing, even when wearing glasses/contacts?: No Does the patient have difficulty concentrating, remembering, or making decisions?: Yes Patient able to express need for assistance with ADLs?: Yes Does the patient have difficulty dressing or bathing?: Yes Independently performs ADLs?: No Communication: Independent Dressing (OT): Needs assistance Is this a change from baseline?: Pre-admission baseline Grooming: Independent Feeding: Independent Bathing: Needs assistance Is this a change from baseline?:  Pre-admission baseline Toileting: Needs assistance Is this a change from baseline?: Pre-admission baseline In/Out Bed: Needs assistance Is this a change from baseline?: Pre-admission baseline Walks in Home: Needs assistance Is this a change from baseline?: Pre-admission baseline Does the patient have difficulty walking or climbing stairs?: Yes Weakness of Legs: Both Weakness of Arms/Hands: Both  Permission Sought/Granted                  Emotional Assessment     Affect (typically observed): Appropriate Orientation: : Oriented to Self, Oriented to Place, Oriented to  Time, Oriented to Situation      Admission diagnosis:  Tachycardia [R00.0] Fever, unspecified fever cause [R50.9] Hypertension, unspecified type [I10] Sepsis ([place]) [A41.9] Patient Active Problem List   Diagnosis Date Noted  . Acute respiratory failure with hypoxia ([place]) 07/20/2017  . Abdominal bloating 07/20/2017  . Sinus tachycardia 07/20/2017  . SLE (systemic lupus erythematosus related syndrome) ([place]) 07/20/2017  . CAP (community acquired pneumonia) 07/16/2017  . Long term (current) use of anticoagulants [Z79.01] 06/21/2017  . Screening examination for infectious disease 01/28/2016  . Type 2 diabetes mellitus ([place]) 08/03/2015  . Vascular dementia ([place]) 05/08/2015  . Hemiparesis as late effect of cerebrovascular accident (CVA) ([place]) 05/08/2015  . Aphasia as late effect of stroke 05/08/2015  . C. difficile colitis 02/25/2015  . Hypokalemia 02/25/2015  . CKD (chronic kidney disease) 02/25/2015  . Colitis 02/14/2015  . Sepsis ([place]) 02/14/2015  . Nausea vomiting and diarrhea 02/14/2015  . Abdominal pain 02/14/2015  . UTI (lower urinary tract infection) 02/14/2015  .  Candida esophagitis ([place]) 11/12/2014  . Abnormal CT of the abdomen   . Steroid-induced hyperglycemia 11/09/2014  . Hypomagnesemia 11/06/2014  . Abdominal pain, acute   . Supratherapeutic INR 11/02/2014  . Jejunitis 11/02/2014  . Myalgia  and myositis 04/05/2014  . Wellness examination 04/05/2014  . Menopausal state 04/05/2014  . Palpitations 08/31/2013  . Hyperlipidemia 08/31/2013  . Disorder of heart rhythm 08/13/2013  . TIA (transient ischemic attack) 06/20/2013  . Gout flare: R elbow and R shoulder 06/20/2013  . Acute encephalopathy 06/14/2013  . Rhabdomyolysis 06/14/2013  . History of stroke with residual effects   . Right sided weakness   . Breast mass, left 04/30/2013  . Contusion of knee, left 10/18/2012  . Depression 10/05/2012  . Encounter for long-term (current) use of other medications 10/04/2012  . DVT (deep venous thrombosis) ([place]) 06/17/2011  . Expressive aphasia 06/17/2011  . Dysphasia 06/11/2011  . Incontinence of urine 06/11/2011  . Hand pain, left 06/11/2011  . Arm pain, left 05/07/2011  . Routine general medical examination at a health care facility 04/13/2011  . Anticoagulated 01/08/2011  . Pulmonary embolism ([place]) 07/16/2010  . Cerebral artery occlusion with cerebral infarction ([place]) 04/17/2010  . CEREBROVASCULAR ACCIDENT, ACUTE 04/15/2010  . THYROID NODULE, LEFT 04/10/2009  . COUGH 03/26/2009  . ACUTE BRONCHITIS 08/22/2007  . KIDNEY TRANSPLANTATION, HX OF 08/22/2007  . Gout 08/21/2007  . Essential hypertension 08/21/2007  . Chronic diastolic congestive heart failure ([place]) 08/21/2007  . GERD 08/21/2007  . Disorder resulting from impaired renal function 08/21/2007  . LUPUS 08/21/2007  . Osteoporosis 08/21/2007  . DVT, HX OF 08/21/2007  . Enteritis due to Clostridium difficile 08/21/2007   PCP:  Patient, No Pcp Per Pharmacy:   [facility] [place], [address] [place] [address] Phone: [phone] Fax: [phone]  [facility] [place], [place] - [address] [place] [address] Phone: [phone] Fax: [phone]     Social  Determinants of Health ([place]) Interventions    Readmission Risk Interventions 30 Day Unplanned Readmission Risk Score     ED to Hosp-Admission (Current) from 08/14/2018 in [place]  30 Day Unplanned Readmission Risk Score (%)  27 Filed at 08/17/2018 1200     This score is the patient's risk of an unplanned readmission within 30 days of being discharged (0 -100%). The score is based on dignosis, age, lab data, medications, orders, and past utilization.   Low:  0-14.9   Medium: 15-21.9   High: 22-29.9   Extreme: 30 and above       No flowsheet data found.

## 2018-08-17 NOTE — Progress Notes (Signed)
PROGRESS NOTE  Connie Ruiz MBT:[id number] DOB: [birth year] DOA: 08/14/2018 PCP: Patient, No Pcp Per   LOS: 1 day   Brief narrative: 58 year old lady with history of prior CVA, diastolic CHF, type 2 diabetes mellitus, prior DVT and PE, hypertension, hyperlipidemia, lupus, kidney transplant on chronic immunosuppressive therapy, who came to the hospital with flulike symptoms.  She reported 3-day history of fevers, nonproductive cough, runny nose and sore throat.  She also had been having generalized abdominal pain along with nausea, vomiting and diarrhea.  No recent travel or sick contacts.  Subjective: Patient complaints of nausea poor oral intake but no vomiting or increasing abdominal pain.  Assessment/Plan:  Principal Problem:   Sepsis ([place]) Active Problems:   LUPUS   Pulmonary embolism (H[facility])   Hypokalemia   Type 2 diabetes mellitus ([place])  Sepsis physiology present on admission.  Received IV cefepime, vancomycin and metronidazole empirically but was subsequently discontinued since the cultures were negative and there was no fever or subsequent leukocytosis.  IV antibiotics were changed to cefdinir to complete the course for possible bronchitis/ status post rhinovirus infection.   Sepsis due to upper respiratory illness/rhinovirus infection.  Blood cultures negative in 3 days.  WBC 10.6 today.  Hypokalemia and hypomagnesemia.  Need to replenish.  Check levels in a.m.  Continue IV fluids with potassium chloride and IV magnesium.  Abdominal pain with vomiting. -Intermittent abdominal pain for several years.  CT scan of the abdomen showed some thickening of the jejunal area.  Bedside diet to full liquid diet today.  Decrease IV fluids.  Hypothyroidism TSH was elevated at 64.    Patient might need the increased dose of Synthroid on discharge.  Will need to hold off with calcium to improve absorption..  Patient will need to check TSH in 3 to 4 weeks as outpatient.    Vomiting with  Gastroccult blood positive.  No further episodes of vomiting or hematemesis.  Patient has history of DVT, pulmonary embolism, history of stroke and requires Coumadin which was subtherapeutic to start with.  Hemoglobin has remained stable.  Will change Protonix to p.o. twice daily.  CBC in a.m.    History of PE and DVT -Patient is on Coumadin at home, she is subtherapeutic here at 1.2.  DVT studies in the ER were negative given right lower extremity swelling. Continue Coumadin per pharmacy.  Compliance was advised to the patient.  History of lupus, renal transplant on chronic immunosuppressive therapy -CellCept and tacrolimus were held on admission, check tacrolimus level still pending.  Will resume CellCept and tacrolimus today.-Continue home prednisone 5 mg daily.    Type 2 diabetes mellitus -Continue sliding scale, Accu-Cheks diabetic diet.  Closely monitor.  VTE Prophylaxis: Warfarin  Code Status: Full code  Family Communication: No one  available at bedside  Disposition Plan: Home likely in 1 to 2 days.  We will ambulate the patient.  Will get physical therapy evaluation.    Consultants:  None  Procedures:  None  Antibiotics: Anti-infectives (From admission, onward)   Start     Dose/Rate Route Frequency Ordered Stop   08/17/18 1200  cefdinir (OMNICEF) capsule 300 mg     300 mg Oral Every 12 hours 08/17/18 1022     08/15/18 1900  vancomycin (VANCOCIN) 1,250 mg in sodium chloride 0.9 % 250 mL IVPB  Status:  Discontinued     1,250 mg 166.7 mL/hr over 90 Minutes Intravenous Every 24 hours 08/14/18 2055 08/15/18 0921   08/15/18 0200  metroNIDAZOLE (FLAGYL)  IVPB 500 mg  Status:  Discontinued     500 mg 100 mL/hr over 60 Minutes Intravenous Every 8 hours 08/14/18 2039 08/17/18 1022   08/15/18 0100  ceFEPIme (MAXIPIME) 1 g in sodium chloride 0.9 % 100 mL IVPB  Status:  Discontinued     1 g 200 mL/hr over 30 Minutes Intravenous Every 8 hours 08/14/18 2055 08/17/18 1022    08/14/18 1600  ceFEPIme (MAXIPIME) 2 g in sodium chloride 0.9 % 100 mL IVPB     2 g 200 mL/hr over 30 Minutes Intravenous  Once 08/14/18 1548 08/14/18 1752   08/14/18 1600  metroNIDAZOLE (FLAGYL) IVPB 500 mg     500 mg 100 mL/hr over 60 Minutes Intravenous  Once 08/14/18 1548 08/14/18 1853   08/14/18 1600  vancomycin (VANCOCIN) IVPB 1000 mg/200 mL premix  Status:  Discontinued     1,000 mg 200 mL/hr over 60 Minutes Intravenous  Once 08/14/18 1548 08/14/18 1553   08/14/18 1600  vancomycin (VANCOCIN) 1,750 mg in sodium chloride 0.9 % 500 mL IVPB     1,750 mg 250 mL/hr over 120 Minutes Intravenous  Once 08/14/18 1553 08/14/18 2154      Objective: Vitals:   08/17/18 0800 08/17/18 1040  BP: (!) 160/102 (!) 167/95  Pulse: (!) 102 96  Resp: 16 18  Temp:  98.7 F (37.1 C)  SpO2: 96% 95%    Intake/Output Summary (Last 24 hours) at 08/17/2018 1304 Last data filed at 08/17/2018 1142 Gross per 24 hour  Intake 1867.79 ml  Output 1680 ml  Net 187.79 ml   Filed Weights   08/14/18 1251 08/14/18 2248  Weight: 83.5 kg 83.4 kg   Body mass index is 31.55 kg/m.   Physical Exam: General:  Average built, not in obvious distress.  Slow to respond.  On nasal cannula oxygen. HENT: Normocephalic, pupils equally reacting to light and accommodation.  No scleral pallor or icterus noted. Oral mucosa is moist.  Chest:   Diminished breath sounds bilaterally. No crackles or wheezes.  CVS: S1 &S2 heard. No murmur.  Regular rate and rhythm. Abdomen: Soft, nontender, nondistended.  Bowel sounds are heard.  Liver is not palpable, no abdominal mass palpated Extremities: No cyanosis, clubbing or edema.  Peripheral pulses are palpable. Psych: Alert, awake and oriented, normal mood CNS:  No cranial nerve deficits.  Power equal in all extremities.  No sensory deficits noted.  No cerebellar signs.   Skin: Warm and dry.  No rashes noted.  Data Review: I have personally reviewed the following laboratory data and  studies,  CBC: Recent Labs  Lab 08/14/18 1324 08/16/18 0433 08/17/18 0338  WBC 10.4 10.7* 10.6*  HGB 13.6 11.5* 11.1*  HCT 45.1 37.1 36.3  MCV 93.6 92.8 93.8  PLT 252 232 458   Basic Metabolic Panel: Recent Labs  Lab 08/14/18 1324 08/14/18 2100 08/15/18 0454 08/16/18 0433 08/17/18 0338  NA 140  --  142 138 140  K 3.2*  --  3.6 3.2* 3.3*  CL 106  --  114* 105 108  CO2 25  --  23 22 26   GLUCOSE 135*  --  106* 105* 96  BUN 18  --  15 7 6   CREATININE 0.77  --  0.60 0.58 0.57  CALCIUM 8.9  --  8.0* 7.7* 7.8*  MG  --  1.6*  --   --  1.5*   Liver Function Tests: Recent Labs  Lab 08/14/18 1324 08/17/18 0338  AST 16 12*  ALT 9 7  ALKPHOS 76 50  BILITOT 1.6* 0.7  PROT 6.9 5.4*  ALBUMIN 3.6 2.6*   Recent Labs  Lab 08/14/18 1324  LIPASE 30   No results for input(s): AMMONIA in the last 168 hours. Cardiac Enzymes: No results for input(s): CKTOTAL, CKMB, CKMBINDEX, TROPONINI in the last 168 hours. BNP (last 3 results) No results for input(s): BNP in the last 8760 hours.  ProBNP (last 3 results) No results for input(s): PROBNP in the last 8760 hours.  CBG: Recent Labs  Lab 08/16/18 1206 08/16/18 1620 08/16/18 2103 08/17/18 0746 08/17/18 1116  GLUCAP 101* 125* 119* 84 103*   Recent Results (from the past 240 hour(s))  Blood Culture (routine x 2)     Status: None (Preliminary result)   Collection Time: 08/14/18  3:47 PM  Result Value Ref Range Status   Specimen Description   Final    BLOOD RIGHT ANTECUBITAL Performed at [facility] [address], [place], [place] [address]    Special Requests   Final    BOTTLES DRAWN AEROBIC AND ANAEROBIC Blood Culture adequate volume Performed at [facility] [address] [place], [place] [address]    Culture   Final    NO GROWTH 3 DAYS Performed at [facility], [place] [address], [place], [place] [address]    Report Status PENDING  Incomplete  Blood Culture (routine  x 2)     Status: None (Preliminary result)   Collection Time: 08/14/18  3:52 PM  Result Value Ref Range Status   Specimen Description   Final    BLOOD LEFT ANTECUBITAL Performed at [facility] [address], [place], [place] [address]    Special Requests   Final    BOTTLES DRAWN AEROBIC AND ANAEROBIC Blood Culture adequate volume Performed at [facility] [address], [place], [place] [address]    Culture   Final    NO GROWTH 3 DAYS Performed at [facility], [place] [address], [place], [place] [address]    Report Status PENDING  Incomplete  Respiratory Panel by PCR     Status: Abnormal   Collection Time: 08/14/18  6:46 PM  Result Value Ref Range Status   Adenovirus NOT DETECTED NOT DETECTED Final   Coronavirus 229E NOT DETECTED NOT DETECTED Final    Comment: (NOTE) The Coronavirus on the Respiratory Panel, DOES NOT test for the novel  Coronavirus (2019 nCoV)    Coronavirus HKU1 NOT DETECTED NOT DETECTED Final   Coronavirus NL63 NOT DETECTED NOT DETECTED Final   Coronavirus OC43 NOT DETECTED NOT DETECTED Final   Metapneumovirus NOT DETECTED NOT DETECTED Final   Rhinovirus / Enterovirus DETECTED (A) NOT DETECTED Final   Influenza A NOT DETECTED NOT DETECTED Final   Influenza B NOT DETECTED NOT DETECTED Final   Parainfluenza Virus 1 NOT DETECTED NOT DETECTED Final   Parainfluenza Virus 2 NOT DETECTED NOT DETECTED Final   Parainfluenza Virus 3 NOT DETECTED NOT DETECTED Final   Parainfluenza Virus 4 NOT DETECTED NOT DETECTED Final   Respiratory Syncytial Virus NOT DETECTED NOT DETECTED Final   Bordetella pertussis NOT DETECTED NOT DETECTED Final   Chlamydophila pneumoniae NOT DETECTED NOT DETECTED Final   Mycoplasma pneumoniae NOT DETECTED NOT DETECTED Final    Comment: Performed at [facility], [place]. [address] [place],  [address]     Studies: Ct Abdomen Pelvis W Contrast  Result Date: 08/15/2018 CLINICAL DATA:  Respiratory illness with nausea and vomiting EXAM: CT ABDOMEN AND PELVIS WITH CONTRAST TECHNIQUE: Multidetector CT imaging of the abdomen and pelvis was performed using the standard protocol following bolus administration of intravenous contrast. CONTRAST:  54mL OMNIPAQUE IOHEXOL 300 MG/ML SOLN, 40mL OMNIPAQUE IOHEXOL 300 MG/ML SOLN COMPARISON:  CTA abdomen pelvis 06/15/2015 FINDINGS: LOWER CHEST: Right basilar consolidation or atelectasis. HEPATOBILIARY: The hepatic contours and density are normal. There is no intra- or extrahepatic biliary dilatation. The gallbladder is normal. PANCREAS: The pancreatic parenchymal contours are normal and there is no ductal dilatation. There is no peripancreatic fluid collection. SPLEEN: Normal. ADRENALS/URINARY TRACT: --Adrenal glands: Normal. --Right kidney/ureter: Severely atrophic right kidney --Left kidney/ureter: Severely atrophic left kidney --right lower quadrant renal transplant: Normal appearance without hydronephrosis. --Urinary bladder: Normal for degree of distention STOMACH/BOWEL: --Stomach/Duodenum: There is no hiatal hernia or other gastric abnormality. The duodenal course and caliber are normal. --Small bowel: There is moderate wall thickening of the proximal jejunum. --Colon: No focal abnormality. --Appendix: Normal. VASCULAR/LYMPHATIC: There is aortic atherosclerosis without hemodynamically significant stenosis. The portal vein, splenic vein, superior mesenteric vein and IVC are patent. No abdominal or pelvic lymphadenopathy. REPRODUCTIVE: Normal uterus. No adnexal mass. MUSCULOSKELETAL. No bony spinal canal stenosis or focal osseous abnormality. OTHER: None. IMPRESSION: 1. Moderate wall thickening of the proximal jejunum, which may indicate acute enteritis. 2. Right basilar consolidation or atelectasis. 3.  Aortic atherosclerosis (ICD10-I70.0). Electronically Signed   By: Ulyses Jarred M.D.   On: 08/15/2018 16:14    Scheduled Meds: . atorvastatin  40 mg Oral  q1800  . calcitRIOL  0.25 mcg Oral QODAY  . cefdinir  300 mg Oral Q12H  . feeding supplement  1 Container Oral BID BM  . insulin aspart  0-9 Units Subcutaneous TID WC  . levothyroxine  150 mcg Oral Q0600  . mycophenolate  250 mg Oral BID  . pantoprazole  40 mg Oral BID AC  . predniSONE  5 mg Oral Q breakfast  . sucralfate  1 g Oral TID WC & HS  . tacrolimus  1 mg Oral QHS   And  . tacrolimus  2 mg Oral Daily  . warfarin  5 mg Oral ONCE-1800  . Warfarin - Pharmacist Dosing Inpatient   Does not apply q1800    Continuous Infusions: . sodium chloride 0.45 % 1,000 mL with potassium chloride 40 mEq infusion 100 mL/hr at 08/17/18 1149     Flora Lipps, MD  [facility] 08/17/2018

## 2018-08-18 LAB — GLUCOSE, CAPILLARY
Glucose-Capillary: 102 mg/dL — ABNORMAL HIGH (ref 70–99)
Glucose-Capillary: 127 mg/dL — ABNORMAL HIGH (ref 70–99)
Glucose-Capillary: 141 mg/dL — ABNORMAL HIGH (ref 70–99)
Glucose-Capillary: 122 mg/dL — ABNORMAL HIGH (ref 70–99)

## 2018-08-18 LAB — PROTIME-INR
INR: 1.9 — ABNORMAL HIGH (ref 0.8–1.2)
Prothrombin Time: 21.6 seconds — ABNORMAL HIGH (ref 11.4–15.2)

## 2018-08-18 LAB — CBC
HCT: 38 % (ref 36.0–46.0)
Hemoglobin: 11.4 g/dL — ABNORMAL LOW (ref 12.0–15.0)
MCH: 28.3 pg (ref 26.0–34.0)
MCHC: 30 g/dL (ref 30.0–36.0)
MCV: 94.3 fL (ref 80.0–100.0)
Platelets: 274 10*3/uL (ref 150–400)
RBC: 4.03 MIL/uL (ref 3.87–5.11)
RDW: 14.6 % (ref 11.5–15.5)
WBC: 8.5 10*3/uL (ref 4.0–10.5)
nRBC: 0 % (ref 0.0–0.2)

## 2018-08-18 LAB — BASIC METABOLIC PANEL
Anion gap: 10 (ref 5–15)
BUN: <5 mg/dL — ABNORMAL LOW (ref 6–20)
CO2: 26 mmol/L (ref 22–32)
Calcium: 7.9 mg/dL — ABNORMAL LOW (ref 8.9–10.3)
Chloride: 103 mmol/L (ref 98–111)
Creatinine, Ser: 0.49 mg/dL (ref 0.44–1.00)
GFR calc Af Amer: >60 mL/min (ref >60)
GFR calc non Af Amer: >60 mL/min (ref >60)
Glucose, Bld: 85 mg/dL (ref 70–99)
Potassium: 3.3 mmol/L — ABNORMAL LOW (ref 3.5–5.1)
Sodium: 139 mmol/L (ref 135–145)

## 2018-08-18 LAB — MAGNESIUM: Magnesium: 1.8 mg/dL (ref 1.7–2.4)

## 2018-08-18 MED ORDER — POTASSIUM CHLORIDE CRYS ER 20 MEQ PO TBCR
40.0000 meq | EXTENDED_RELEASE_TABLET | Freq: Once | ORAL | Status: AC
  Administered 2018-08-18: 40 meq via ORAL
  Filled 2018-08-18: qty 2

## 2018-08-18 MED ORDER — WARFARIN SODIUM 5 MG PO TABS
5.0000 mg | ORAL_TABLET | Freq: Once | ORAL | Status: AC
  Administered 2018-08-18: 5 mg via ORAL
  Filled 2018-08-18: qty 1

## 2018-08-18 NOTE — Progress Notes (Signed)
PROGRESS NOTE  Connie Ruiz WYO:[id number] DOB: 02-06-1961 DOA: 08/14/2018 PCP: Patient, No Pcp Per   LOS: 2 days   Brief narrative: 58 year old female with history of prior CVA, diastolic CHF, type 2 diabetes mellitus, prior DVT and PE, hypertension, hyperlipidemia, lupus, kidney transplant on chronic immunosuppressive therapy, who presented to the hospital with flulike symptoms.  She reported 3-day history of fevers, nonproductive cough, runny nose and sore throat.  She also had been having generalized abdominal pain along with nausea, vomiting and diarrhea.  No recent travel or sick contacts.  Subjective: Today patient complains of generalized weakness but no nausea vomiting or abdominal pain.  Assessment/Plan:  Principal Problem:   Sepsis ([place]) Active Problems:   LUPUS   Pulmonary embolism (HCC)   Hypokalemia   Type 2 diabetes mellitus ([place])  Sepsis, present on admission.  Received IV cefepime, vancomycin and metronidazole empirically but was subsequently discontinued since the cultures were negative and there was no fever or subsequent leukocytosis.  On Po cefdinir to complete the course for possible bronchitis/ status post rhinovirus infection.    Blood cultures negative in 3 days.  No leukocytosis  Hypokalemia.  Continue to replenish.    Hypomagnesemia.  Improved with replacement.  Abdominal pain with vomiting.  Improved -Intermittent abdominal pain for several years.  CT scan of the abdomen showed some thickening of the jejunal area.    Hypothyroidism TSH was elevated at 64.    Patient was on Synthroid 137 mcg at home which has been increased to 150 mcg. Patient will need to check TSH in 3 to 4 weeks as outpatient.    Vomiting with Gastroccult blood positive.  No further episodes of vomiting or hematemesis.  Patient has history of DVT, pulmonary embolism, history of stroke and requires Coumadin which was subtherapeutic to start with.  Hemoglobin has remained stable. on  Protonix  p.o. twice daily.    History of PE and DVT -Patient is on Coumadin at home,  subtherapeutic on presentation. DVT studies in the ER were negative given right lower extremity swelling. Continue Coumadin per pharmacy.  Compliance was advised to the patient.  Wean oxygen as able.  History of lupus, renal transplant on chronic immunosuppressive therapy -CellCept and tacrolimus were held on admission, check tacrolimus level still pending.   CellCept and tacrolimus resumed. Continue home prednisone 5 mg daily.    Type 2 diabetes mellitus -Continue sliding scale, Accu-Cheks diabetic diet.  Closely monitor.  VTE Prophylaxis: Warfarin  Code Status: Full code  Family Communication: I had a prolonged discussion with the patient's daughter on the phone about the medical condition of the patient and the plan for disposition.  Patient's daughter is concerned about patient going home.  I updated the social worker about potential placement.  Disposition Plan: Skilled nursing facility in 2 to 3 days.    Consultants:  None  Procedures:  None  Antibiotics: Anti-infectives (From admission, onward)   Start     Dose/Rate Route Frequency Ordered Stop   08/17/18 1200  cefdinir (OMNICEF) capsule 300 mg     300 mg Oral Every 12 hours 08/17/18 1022     08/15/18 1900  vancomycin (VANCOCIN) 1,250 mg in sodium chloride 0.9 % 250 mL IVPB  Status:  Discontinued     1,250 mg 166.7 mL/hr over 90 Minutes Intravenous Every 24 hours 08/14/18 2055 08/15/18 0921   08/15/18 0200  metroNIDAZOLE (FLAGYL) IVPB 500 mg  Status:  Discontinued     500 mg 100 mL/hr over  60 Minutes Intravenous Every 8 hours 08/14/18 2039 08/17/18 1022   08/15/18 0100  ceFEPIme (MAXIPIME) 1 g in sodium chloride 0.9 % 100 mL IVPB  Status:  Discontinued     1 g 200 mL/hr over 30 Minutes Intravenous Every 8 hours 08/14/18 2055 08/17/18 1022   08/14/18 1600  ceFEPIme (MAXIPIME) 2 g in sodium chloride 0.9 % 100 mL IVPB     2 g 200  mL/hr over 30 Minutes Intravenous  Once 08/14/18 1548 08/14/18 1752   08/14/18 1600  metroNIDAZOLE (FLAGYL) IVPB 500 mg     500 mg 100 mL/hr over 60 Minutes Intravenous  Once 08/14/18 1548 08/14/18 1853   08/14/18 1600  vancomycin (VANCOCIN) IVPB 1000 mg/200 mL premix  Status:  Discontinued     1,000 mg 200 mL/hr over 60 Minutes Intravenous  Once 08/14/18 1548 08/14/18 1553   08/14/18 1600  vancomycin (VANCOCIN) 1,750 mg in sodium chloride 0.9 % 500 mL IVPB     1,750 mg 250 mL/hr over 120 Minutes Intravenous  Once 08/14/18 1553 08/14/18 2154      Objective: Vitals:   08/18/18 1234 08/18/18 1431  BP: (!) 161/98   Pulse: (!) 114 (!) 104  Resp: 18   Temp:    SpO2: 95%     Intake/Output Summary (Last 24 hours) at 08/18/2018 1447 Last data filed at 08/18/2018 1233 Gross per 24 hour  Intake 2964.72 ml  Output 1200 ml  Net 1764.72 ml   Filed Weights   08/14/18 1251 08/14/18 2248  Weight: 83.5 kg 83.4 kg   Body mass index is 31.55 kg/m.   Physical Exam: General:  Average built, not in obvious distress.  Slow to respond.  On nasal cannula oxygen. HENT: Normocephalic, pupils equally reacting to light and accommodation.  No scleral pallor or icterus noted. Oral mucosa is moist.  Chest:  Clear breath sounds.  Diminished breath sounds bilaterally. No crackles or wheezes.  CVS: S1 &S2 heard. No murmur.  Regular rate and rhythm. Abdomen: Soft, nontender, nondistended.  Bowel sounds are heard.  Liver is not palpable, no abdominal mass palpated Extremities: No cyanosis, clubbing or edema.  Peripheral pulses are palpable. Psych: Alert, awake and oriented, normal mood CNS:  No cranial nerve deficits.  Power equal in all extremities.  No sensory deficits noted.  No cerebellar signs.   Skin: Warm and dry.  No rashes noted.   Data Review: I have personally reviewed the following laboratory data and studies,  CBC: Recent Labs  Lab 08/14/18 1324 08/16/18 0433 08/17/18 0338  08/18/18 0334  WBC 10.4 10.7* 10.6* 8.5  HGB 13.6 11.5* 11.1* 11.4*  HCT 45.1 37.1 36.3 38.0  MCV 93.6 92.8 93.8 94.3  PLT 252 232 250 875   Basic Metabolic Panel: Recent Labs  Lab 08/14/18 1324 08/14/18 2100 08/15/18 0454 08/16/18 0433 08/17/18 0338 08/18/18 0334  NA 140  --  142 138 140 139  K 3.2*  --  3.6 3.2* 3.3* 3.3*  CL 106  --  114* 105 108 103  CO2 25  --  23 22 26 26   GLUCOSE 135*  --  106* 105* 96 85  BUN 18  --  15 7 6  <5*  CREATININE 0.77  --  0.60 0.58 0.57 0.49  CALCIUM 8.9  --  8.0* 7.7* 7.8* 7.9*  MG  --  1.6*  --   --  1.5* 1.8   Liver Function Tests: Recent Labs  Lab 08/14/18 1324 08/17/18 0338  AST  16 12*  ALT 9 7  ALKPHOS 76 50  BILITOT 1.6* 0.7  PROT 6.9 5.4*  ALBUMIN 3.6 2.6*   Recent Labs  Lab 08/14/18 1324  LIPASE 30   No results for input(s): AMMONIA in the last 168 hours. Cardiac Enzymes: No results for input(s): CKTOTAL, CKMB, CKMBINDEX, TROPONINI in the last 168 hours. BNP (last 3 results) No results for input(s): BNP in the last 8760 hours.  ProBNP (last 3 results) No results for input(s): PROBNP in the last 8760 hours.  CBG: Recent Labs  Lab 08/17/18 1116 08/17/18 1620 08/17/18 2143 08/18/18 0744 08/18/18 1128  GLUCAP 103* 128* 87 102* 127*   Recent Results (from the past 240 hour(s))  Blood Culture (routine x 2)     Status: None (Preliminary result)   Collection Time: 08/14/18  3:47 PM  Result Value Ref Range Status   Specimen Description   Final    BLOOD RIGHT ANTECUBITAL Performed at [facility] [address], [place], [place] [address]    Special Requests   Final    BOTTLES DRAWN AEROBIC AND ANAEROBIC Blood Culture adequate volume Performed at [facility] [address] [place], [place] [address]    Culture   Final    NO GROWTH 3 DAYS Performed at [facility], [place] [address] [place], [place] [address]    Report Status PENDING  Incomplete  Blood  Culture (routine x 2)     Status: None (Preliminary result)   Collection Time: 08/14/18  3:52 PM  Result Value Ref Range Status   Specimen Description   Final    BLOOD LEFT ANTECUBITAL Performed at [facility] [address] [place], [place] [address]    Special Requests   Final    BOTTLES DRAWN AEROBIC AND ANAEROBIC Blood Culture adequate volume Performed at [facility] [address] [place], [place] [address]    Culture   Final    NO GROWTH 3 DAYS Performed at [facility], [place] [address] [place], [place] [address]    Report Status PENDING  Incomplete  Respiratory Panel by PCR     Status: Abnormal   Collection Time: 08/14/18  6:46 PM  Result Value Ref Range Status   Adenovirus NOT DETECTED NOT DETECTED Final   Coronavirus 229E NOT DETECTED NOT DETECTED Final    Comment: (NOTE) The Coronavirus on the Respiratory Panel, DOES NOT test for the novel  Coronavirus (2019 nCoV)    Coronavirus HKU1 NOT DETECTED NOT DETECTED Final   Coronavirus NL63 NOT DETECTED NOT DETECTED Final   Coronavirus OC43 NOT DETECTED NOT DETECTED Final   Metapneumovirus NOT DETECTED NOT DETECTED Final   Rhinovirus / Enterovirus DETECTED (A) NOT DETECTED Final   Influenza A NOT DETECTED NOT DETECTED Final   Influenza B NOT DETECTED NOT DETECTED Final   Parainfluenza Virus 1 NOT DETECTED NOT DETECTED Final   Parainfluenza Virus 2 NOT DETECTED NOT DETECTED Final   Parainfluenza Virus 3 NOT DETECTED NOT DETECTED Final   Parainfluenza Virus 4 NOT DETECTED NOT DETECTED Final   Respiratory Syncytial Virus NOT DETECTED NOT DETECTED Final   Bordetella pertussis NOT DETECTED NOT DETECTED Final   Chlamydophila pneumoniae NOT DETECTED NOT DETECTED Final   Mycoplasma pneumoniae NOT DETECTED NOT DETECTED Final    Comment: Performed at [facility], [place]. [address], [place], [place] [address]     Studies: No results found.  Scheduled Meds: . atorvastatin  40  mg Oral q1800  . calcitRIOL  0.25 mcg Oral QODAY  . cefdinir  300 mg Oral Q12H  . feeding supplement  1 Container Oral BID BM  . insulin aspart  0-9 Units Subcutaneous TID WC  . levothyroxine  150 mcg Oral Q0600  . mycophenolate  250 mg Oral BID  . pantoprazole  40 mg Oral BID AC  . predniSONE  5 mg Oral Q breakfast  . sucralfate  1 g Oral TID WC & HS  . tacrolimus  1 mg Oral QHS   And  . tacrolimus  2 mg Oral Daily  . Warfarin - Pharmacist Dosing Inpatient   Does not apply q1800    Continuous Infusions: . sodium chloride 0.45 % 1,000 mL with potassium chloride 40 mEq infusion 75 mL/hr at 08/18/18 0053     Flora Lipps, MD  [facility] 08/18/2018

## 2018-08-18 NOTE — Care Management Important Message (Signed)
Important Message  Patient Details  Name: Connie Ruiz MRN: [id number] Date of Birth: [birth year]   Medicare Important Message Given:  Yes    Kerin Salen 08/18/2018, 12:26 [place] Message  Patient Details  Name: Connie Ruiz MRN: [id number] Date of Birth: 1960-12-14   Medicare Important Message Given:  Yes    Kerin Salen 08/18/2018, 12:26 PM

## 2018-08-18 NOTE — Evaluation (Signed)
Physical Therapy Evaluation Patient Details Name: Connie Ruiz MRN: [id number] DOB: [birth year] Today's Date: 08/18/2018   History of Present Illness  58 yo female admitted with sepsis. Hx of CVA-R residual weakness, CHF, DM, DVT, PE, lupus, kidney transplant  Clinical Impression  On eval, pt required Mod assist to stand and Min assist to ambulate ~40 feet with a RW. Pt presents with general weakness, decreased activity tolerance, and impaired gait and balance. Pt uses a cane at baseline but she required a RW on today. O2 sat levels fluctuated between 89-93% at end of session (difficult to get reading during ambulation). HR up to 149 bpm with activity. At this time, feel pt may require ST rehab at [facility] unless she can arrange for more in home help. She has an aide 4 hours/day currently. Will continue to follow and progress activity as tolerated.     Follow Up Recommendations SNF vs Home health PT;Supervision/Assistance - 24 hour(depending on progress and pt/family decision)    Equipment Recommendations  None recommended by PT    Recommendations for Other Services       Precautions / Restrictions Precautions Precautions: Fall Precaution Comments: monitor HR and O2 Restrictions Weight Bearing Restrictions: No      Mobility  Bed Mobility               General bed mobility comments: oob in recliner  Transfers Overall transfer level: Needs assistance Equipment used: None Transfers: Sit to/from Stand Sit to Stand: Mod assist         General transfer comment: Assist to rise, stabilize, control descent. Multiple attempts to get standing, even with assistance. VCs safety, technique, hand placement.   Ambulation/Gait Ambulation/Gait assistance: Min assist Gait Distance (Feet): 40 Feet Assistive device: Rolling walker (2 wheeled) Gait Pattern/deviations: Step-through pattern;Decreased stride length     General Gait Details: Slow gait speed. Assist to stabilize throughout  distance. Difficulty getting O2 reading with pulse ox (once back in room O2 fluctuated between 89-93% on RA). HR up to 149.   Stairs            Wheelchair Mobility    Modified Rankin (Stroke Patients Only)       Balance Overall balance assessment: Needs assistance         Standing balance support: Bilateral upper extremity supported Standing balance-Leahy Scale: Poor                               Pertinent Vitals/Pain Pain Assessment: No/denies pain    Home Living Family/patient expects to be discharged to:: Private residence Living Arrangements: Alone Available Help at Discharge: (5 days a week) Type of Home: Apartment Home Access: Stairs to enter     Home Layout: One level Home Equipment: Environmental consultant - 2 wheels;Cane - single point      Prior Function Level of Independence: Needs assistance   Gait / Transfers Assistance Needed: uses device to ambulate  ADL's / Homemaking Assistance Needed: aide helps with bathing, home care, some cooking        Hand Dominance        Extremity/Trunk Assessment   Upper Extremity Assessment Upper Extremity Assessment: Generalized weakness    Lower Extremity Assessment Lower Extremity Assessment: Generalized weakness    Cervical / Trunk Assessment Cervical / Trunk Assessment: Kyphotic  Communication   Communication: Expressive difficulties  Cognition Arousal/Alertness: Awake/alert Behavior During Therapy: WFL for tasks assessed/performed Overall Cognitive Status: Impaired/Different from  baseline Area of Impairment: Safety/judgement;Problem solving                         Safety/Judgement: Decreased awareness of safety   Problem Solving: Slow processing;Requires verbal cues;Requires tactile cues General Comments: has aphasia      General Comments      Exercises     Assessment/Plan    PT Assessment Patient needs continued PT services  PT Problem List Decreased strength;Decreased  balance;Decreased cognition;Decreased mobility;Decreased activity tolerance;Decreased knowledge of use of DME       PT Treatment Interventions DME instruction;Gait training;Therapeutic activities;Functional mobility training;Balance training;Patient/family education;Therapeutic exercise    PT Goals (Current goals can be found in the Care Plan section)  Acute Rehab PT Goals Patient Stated Goal: none stated PT Goal Formulation: With patient Time For Goal Achievement: 09/01/18 Potential to Achieve Goals: Fair    Frequency Min 3X/week   Barriers to discharge        Co-evaluation               AM-PAC PT "6 Clicks" Mobility  Outcome Measure Help needed turning from your back to your side while in a flat bed without using bedrails?: A Little Help needed moving from lying on your back to sitting on the side of a flat bed without using bedrails?: A Little Help needed moving to and from a bed to a chair (including a wheelchair)?: A Lot Help needed standing up from a chair using your arms (e.g., wheelchair or bedside chair)?: A Lot Help needed to walk in hospital room?: A Little Help needed climbing 3-5 steps with a railing? : A Lot 6 Click Score: 15    End of Session Equipment Utilized During Treatment: Gait belt Activity Tolerance: Patient limited by fatigue Patient left: in chair;with call bell/phone within reach;with chair alarm set   PT Visit Diagnosis: Muscle weakness (generalized) (M62.81);Difficulty in walking, not elsewhere classified (R26.2);Unsteadiness on feet (R26.81)    Time: 4431-5400 PT Time Calculation (min) (ACUTE ONLY): 19 min   Charges:   PT Evaluation $PT Eval Moderate Complexity: [place], PT Acute Rehabilitation Services Pager: [phone] Office: [phone]

## 2018-08-18 NOTE — NC FL2 (Signed)
[place] LEVEL OF CARE SCREENING TOOL     IDENTIFICATION  Patient Name: ASTHA PROBASCO Birthdate: Apr 07, 1961 Sex: female Admission Date (Current Location): 08/14/2018  [facility] and [place] Number:  Herbalist and Address:  [facility],  [place] [address], [place]      Provider Number: [id number]  Attending Physician Name and Address:  Flora Lipps, MD  Relative Name and Phone Number:       Current Level of Care: Hospital Recommended Level of Care: [place] Prior Approval Number:    Date Approved/Denied:   PASRR Number:    Discharge Plan: SNF    Current Diagnoses: Patient Active Problem List   Diagnosis Date Noted  . Acute respiratory failure with hypoxia ([place]) 07/20/2017  . Abdominal bloating 07/20/2017  . Sinus tachycardia 07/20/2017  . SLE (systemic lupus erythematosus related syndrome) ([place]) 07/20/2017  . CAP (community acquired pneumonia) 07/16/2017  . Long term (current) use of anticoagulants [Z79.01] 06/21/2017  . Screening examination for infectious disease 01/28/2016  . Type 2 diabetes mellitus ([place]) 08/03/2015  . Vascular dementia ([place]) 05/08/2015  . Hemiparesis as late effect of cerebrovascular accident (CVA) ([place]) 05/08/2015  . Aphasia as late effect of stroke 05/08/2015  . C. difficile colitis 02/25/2015  . Hypokalemia 02/25/2015  . CKD (chronic kidney disease) 02/25/2015  . Colitis 02/14/2015  . Sepsis ([place]) 02/14/2015  . Nausea vomiting and diarrhea 02/14/2015  . Abdominal pain 02/14/2015  . UTI (lower urinary tract infection) 02/14/2015  . Candida esophagitis ([place]) 11/12/2014  . Abnormal CT of the abdomen   . Steroid-induced hyperglycemia 11/09/2014  . Hypomagnesemia 11/06/2014  . Abdominal pain, acute   . Supratherapeutic INR 11/02/2014  . Jejunitis 11/02/2014  . Myalgia and myositis 04/05/2014  . Wellness examination 04/05/2014  . Menopausal state 04/05/2014  . Palpitations  08/31/2013  . Hyperlipidemia 08/31/2013  . Disorder of heart rhythm 08/13/2013  . TIA (transient ischemic attack) 06/20/2013  . Gout flare: R elbow and R shoulder 06/20/2013  . Acute encephalopathy 06/14/2013  . Rhabdomyolysis 06/14/2013  . History of stroke with residual effects   . Right sided weakness   . Breast mass, left 04/30/2013  . Contusion of knee, left 10/18/2012  . Depression 10/05/2012  . Encounter for long-term (current) use of other medications 10/04/2012  . DVT (deep venous thrombosis) ([place]) 06/17/2011  . Expressive aphasia 06/17/2011  . Dysphasia 06/11/2011  . Incontinence of urine 06/11/2011  . Hand pain, left 06/11/2011  . Arm pain, left 05/07/2011  . Routine general medical examination at a health care facility 04/13/2011  . Anticoagulated 01/08/2011  . Pulmonary embolism ([place]) 07/16/2010  . Cerebral artery occlusion with cerebral infarction ([place]) 04/17/2010  . CEREBROVASCULAR ACCIDENT, ACUTE 04/15/2010  . THYROID NODULE, LEFT 04/10/2009  . COUGH 03/26/2009  . ACUTE BRONCHITIS 08/22/2007  . KIDNEY TRANSPLANTATION, HX OF 08/22/2007  . Gout 08/21/2007  . Essential hypertension 08/21/2007  . Chronic diastolic congestive heart failure ([place]) 08/21/2007  . GERD 08/21/2007  . Disorder resulting from impaired renal function 08/21/2007  . LUPUS 08/21/2007  . Osteoporosis 08/21/2007  . DVT, HX OF 08/21/2007  . Enteritis due to Clostridium difficile 08/21/2007    Orientation RESPIRATION BLADDER Height & Weight     Self, Time, Situation, Place  Normal External catheter Weight: 183 lb 12.8 oz (83.4 kg) Height:  5\' 4"  (162.6 cm)  BEHAVIORAL SYMPTOMS/MOOD NEUROLOGICAL BOWEL NUTRITION STATUS      Continent Diet  AMBULATORY STATUS COMMUNICATION  OF NEEDS Skin   Extensive Assist Verbally Normal                       Personal Care Assistance Level of Assistance  Bathing, Feeding, Dressing Bathing Assistance: Maximum assistance Feeding assistance:  Independent Dressing Assistance: Maximum assistance     Functional Limitations Info  Sight, Hearing, Speech Sight Info: Adequate Hearing Info: Adequate Speech Info: Adequate    SPECIAL CARE FACTORS FREQUENCY  OT (By licensed OT), PT (By licensed PT)     PT Frequency: 5x/week OT Frequency: 5x/week            Contractures Contractures Info: Not present    Additional Factors Info  Insulin Sliding Scale Code Status Info: Fullcode Allergies Info: Oxycodone-acetaminophen, Propoxyphene, Propoxyphene N-acetaminophen, Sulfonamide Derivatives, Codeine, Hydrocodone-acetaminophen, Hydromorphone, Other, Oxycodone, Sulfamethoxazole, Tape, Gabapentin, Latex, Metoprolol, Morphine And Related, Rosiglitazone   Insulin Sliding Scale Info: See medication list       Current Medications (08/18/2018):  This is the current hospital active medication list Current Facility-Administered Medications  Medication Dose Route Frequency Provider Last Rate Last Dose  . atorvastatin (LIPITOR) tablet 40 mg  40 mg Oral q1800 Shela Leff, MD   40 mg at 08/17/18 1725  . calcitRIOL (ROCALTROL) capsule 0.25 mcg  0.25 mcg Oral Derrek Gu, MD   0.25 mcg at 08/17/18 1046  . cefdinir (OMNICEF) capsule 300 mg  300 mg Oral Q12H Pokhrel, Laxman, MD   300 mg at 08/18/18 0916  . diphenhydrAMINE (BENADRYL) injection 12.5 mg  12.5 mg Intravenous Q6H PRN Pokhrel, Laxman, MD   12.5 mg at 08/16/18 2128  . feeding supplement (BOOST / RESOURCE BREEZE) liquid 1 Container  1 Container Oral BID BM Pokhrel, Laxman, MD   1 Container at 08/18/18 1337  . insulin aspart (novoLOG) injection 0-9 Units  0-9 Units Subcutaneous TID WC Shela Leff, MD   1 Units at 08/18/18 1314  . levothyroxine (SYNTHROID, LEVOTHROID) tablet 150 mcg  150 mcg Oral Q0600 Caren Griffins, MD   150 mcg at 08/18/18 0525  . morphine 2 MG/ML injection 2 mg  2 mg Intravenous Q3H PRN Pokhrel, Laxman, MD   2 mg at 08/18/18 0058  .  mycophenolate (CELLCEPT) capsule 250 mg  250 mg Oral BID Pokhrel, Laxman, MD   250 mg at 08/18/18 0916  . ondansetron (ZOFRAN) injection 4 mg  4 mg Intravenous Q6H PRN Pokhrel, Laxman, MD   4 mg at 08/18/18 0058  . oxybutynin (DITROPAN) tablet 5 mg  5 mg Oral Daily PRN Caren Griffins, MD   5 mg at 08/16/18 1779  . pantoprazole (PROTONIX) EC tablet 40 mg  40 mg Oral BID AC Pokhrel, Laxman, MD   40 mg at 08/18/18 0916  . predniSONE (DELTASONE) tablet 5 mg  5 mg Oral Q breakfast Shela Leff, MD   5 mg at 08/18/18 0916  . promethazine (PHENERGAN) injection 6.25 mg  6.25 mg Intravenous Q6H PRN Caren Griffins, MD   6.25 mg at 08/16/18 1246  . sodium chloride 0.45 % 1,000 mL with potassium chloride 40 mEq infusion   Intravenous Continuous Pokhrel, Laxman, MD 75 mL/hr at 08/18/18 0053    . sucralfate (CARAFATE) 1 GM/10ML suspension 1 g  1 g Oral TID WC & HS Caren Griffins, MD   1 g at 08/18/18 1337  . tacrolimus (PROGRAF) capsule 1 mg  1 mg Oral QHS Pokhrel, Laxman, MD   1 mg at 08/17/18 2123  And  . tacrolimus (PROGRAF) capsule 2 mg  2 mg Oral Daily Pokhrel, Laxman, MD   2 mg at 08/18/18 0916  . traMADol (ULTRAM) tablet 50 mg  50 mg Oral Q8H PRN Caren Griffins, MD   50 mg at 08/15/18 1101  . Warfarin - Pharmacist Dosing Inpatient   Does not apply q1800 Nilda Simmer, [facility]         Discharge Medications: Please see discharge summary for a list of discharge medications.  Relevant Imaging Results:  Relevant Lab Results:   Additional Information ssn:[id number]  Lia Hopping, LCSW

## 2018-08-18 NOTE — TOC Progression Note (Signed)
Transition of Care [facility]) - Progression Note    Patient Details  Name: Connie Ruiz MRN: [id number] Date of Birth: 20-Jun-1960  Transition of Care [facility]) CM/SW [place], [place] Phone Number: 08/18/2018, 2:23 PM  Clinical Narrative:    Patient presented to the hospital for evaluation of flulike symptoms.  Patient reports 3-day history of fevers, nonproductive cough, rhinorrhea, sore throat, generalized abdominal pain, nausea, vomiting, fatigue, and body aches.    Patient daughter concern about the patient home with limited support. The patient daughter reports the patient live alone and has a nurse aide that assist Monday- Sunday but is not always reliable. Patient is agreeable to SNF placement to regain her strength. Patient uses a cane and walker at baseline and needs assistance with bathing and dressing.   Patient reports this is the first time she has been to SNF. CSW explain the SNF/Insurance process and will follow up with bed offers.  Patient reports her goal is to " go out and service again to the people."( Patient is a Jehovah witness).   FL2 complete.  PASRR pending, Level II under manuel review.     Expected Discharge Plan: Skill Nursing Facility  Barriers to Discharge: Other PASRR level II/ [facility] (not available on weekend)  Expected Discharge Plan and Services Expected Discharge Plan: [place]  Discharge Planning Services: CM Consult   Living arrangements for the past 2 months: Apartment Expected Discharge Date: (unknown)                        Social Determinants of Health (SDOH) Interventions    Readmission Risk Interventions 30 Day Unplanned Readmission Risk Score     ED to Hosp-Admission (Current) from 08/14/2018 in [place]  30 Day Unplanned Readmission Risk Score (%)  28 Filed at 08/18/2018 1200     This score is the patient's risk of an unplanned readmission  within 30 days of being discharged (0 -100%). The score is based on dignosis, age, lab data, medications, orders, and past utilization.   Low:  0-14.9   Medium: 15-21.9   High: 22-29.9   Extreme: 30 and above       No flowsheet data found.

## 2018-08-18 NOTE — Progress Notes (Signed)
[place] for Warfarin (on warfarin PTA) Indication: H/O PE and DVT  Allergies  Allergen Reactions  . Oxycodone-Acetaminophen Shortness Of Breath and Nausea Only  . Propoxyphene Nausea Only and Shortness Of Breath    States takes tylenol at home  . Propoxyphene N-Acetaminophen Shortness Of Breath and Nausea Only  . Sulfonamide Derivatives Shortness Of Breath and Nausea Only  . Codeine Nausea Only  . Hydrocodone-Acetaminophen   . Hydromorphone   . Other Other (See Comments)    No blood, Jehovaeh Witness   . Oxycodone   . Sulfamethoxazole   . Tape     Redness**PAPER TAPE OK**  . Gabapentin Anxiety    twitching  . Latex Rash  . Metoprolol Rash  . Morphine And Related Rash    IV site on arm is red, patient reports this is improving.  NO shortness of breath reported.  . Rosiglitazone Rash    Patient Measurements: Height: 5\' 4"  (162.6 cm) Weight: 183 lb 12.8 oz (83.4 kg) IBW/kg (Calculated) : 54.7   Vital Signs: Temp: 98.6 F (37 C) (03/13 0503) Temp Source: Oral (03/13 0503) BP: 156/102 (03/13 0525) Pulse Rate: 105 (03/13 0503)  Labs: Recent Labs    08/16/18 0433 08/17/18 0338 08/18/18 0334  HGB 11.5* 11.1* 11.4*  HCT 37.1 36.3 38.0  PLT 232 250 274  LABPROT 16.8* 18.7* 21.6*  INR 1.4* 1.6* 1.9*  CREATININE 0.58 0.57 0.49    Estimated Creatinine Clearance: 81.1 mL/min (by C-G formula based on SCr of 0.49 mg/dL).   Medical History: Past Medical History:  Diagnosis Date  . Anxiety   . Candida esophagitis ([place]) 11/12/2014  . CEREBROVASCULAR ACCIDENT, ACUTE 04/15/2010  . CLOSTRIDIUM DIFFICILE COLITIS, HX OF 08/21/2007  . CONGESTIVE HEART FAILURE 08/21/2007  . Current use of long term anticoagulation    Dr. Andree Elk, [facility]  . CVA 04/17/2010  . Depression    Dr. Andree Elk, [place], TYPE II 08/21/2007  . DVT, HX OF 08/21/2007  . GERD 08/21/2007  . GOUT 08/21/2007  . History of stroke with residual  effects   . HYPERLIPIDEMIA 08/21/2007  . HYPERTENSION 08/21/2007   Dr. Andree Elk, [place], HX OF 08/22/2007   s/p renal transplant-Dr. Andree Elk, [place] 08/21/2007  . OSTEOPOROSIS 08/21/2007   [facility]  . Pulmonary embolism ([place]) 07/16/2010  . Renal failure   . RENAL INSUFFICIENCY 08/21/2007  . Right sided weakness   . Steroid-induced hyperglycemia 11/09/2014  . Tachycardia   . THYROID NODULE, LEFT 04/10/2009    Medications:  PTA Warfarin regimen:  2.5mg  daily except 5mg  on Mon & Fri  Assessment: 58 y.o. female admitted on 08/14/2018 with sepsis.  PMH significant for lupus, kidney transplant, h/o PE & DVT. Pharmacy consulted to continue dosing warfarin on admission.  Today, 08/18/2018  INR upon admission = 1.2 (INR 3 weeks ago = 2.0), reports last dose taken 08/11/2018  INR today remains subtherapeutic but rising to goal- 1.9  CBC: Hgb 11.4, Plts wnl  Drug interactions: broad spectrum abx transitioned to cefdinir - may potentially increase INR  CLD ordered-  75% of breakfast eaten   Significant events: 3/10: coffee ground emesis, gastric contents heme (+), warfarin dose held 3/11: discussed above events with Dr. Louanne Belton, ok to proceed with warfarin today since Hgb did not significantly drop; will not use lovenox bridging at this time 3/13 No further bleeding issues per rounds   Goal of Therapy:  INR 2-3   Plan:   Warfarin 5mg  po x 1 dose  Check daily PT/INR  Monitor for signs and symptoms of bleeding    Royetta Asal, PharmD, BCPS Pager [phone] 08/18/2018 10:12 AM

## 2018-08-19 LAB — PROTIME-INR
INR: 2.3 — ABNORMAL HIGH (ref 0.8–1.2)
Prothrombin Time: 24.8 seconds — ABNORMAL HIGH (ref 11.4–15.2)

## 2018-08-19 LAB — CULTURE, BLOOD (ROUTINE X 2)
Culture: NO GROWTH
Culture: NO GROWTH
Special Requests: ADEQUATE
Special Requests: ADEQUATE

## 2018-08-19 LAB — CBC
HCT: 38.1 % (ref 36.0–46.0)
Hemoglobin: 12 g/dL (ref 12.0–15.0)
MCH: 29.1 pg (ref 26.0–34.0)
MCHC: 31.5 g/dL (ref 30.0–36.0)
MCV: 92.3 fL (ref 80.0–100.0)
Platelets: 299 10*3/uL (ref 150–400)
RBC: 4.13 MIL/uL (ref 3.87–5.11)
RDW: 14.6 % (ref 11.5–15.5)
WBC: 9.8 10*3/uL (ref 4.0–10.5)
nRBC: 0 % (ref 0.0–0.2)

## 2018-08-19 LAB — GLUCOSE, CAPILLARY
Glucose-Capillary: 80 mg/dL (ref 70–99)
Glucose-Capillary: 204 mg/dL — ABNORMAL HIGH (ref 70–99)
Glucose-Capillary: 156 mg/dL — ABNORMAL HIGH (ref 70–99)
Glucose-Capillary: 123 mg/dL — ABNORMAL HIGH (ref 70–99)

## 2018-08-19 LAB — BASIC METABOLIC PANEL
Anion gap: 8 (ref 5–15)
BUN: <5 mg/dL — ABNORMAL LOW (ref 6–20)
CO2: 28 mmol/L (ref 22–32)
Calcium: 7.9 mg/dL — ABNORMAL LOW (ref 8.9–10.3)
Chloride: 103 mmol/L (ref 98–111)
Creatinine, Ser: 0.5 mg/dL (ref 0.44–1.00)
GFR calc Af Amer: >60 mL/min (ref >60)
GFR calc non Af Amer: >60 mL/min (ref >60)
Glucose, Bld: 91 mg/dL (ref 70–99)
Potassium: 3.4 mmol/L — ABNORMAL LOW (ref 3.5–5.1)
Sodium: 139 mmol/L (ref 135–145)

## 2018-08-19 MED ORDER — LOSARTAN POTASSIUM 50 MG PO TABS
100.0000 mg | ORAL_TABLET | Freq: Every day | ORAL | Status: DC
  Administered 2018-08-19 – 2018-08-22 (×4): 100 mg via ORAL
  Filled 2018-08-19 (×5): qty 2

## 2018-08-19 MED ORDER — POTASSIUM CHLORIDE CRYS ER 20 MEQ PO TBCR
40.0000 meq | EXTENDED_RELEASE_TABLET | Freq: Two times a day (BID) | ORAL | Status: DC
  Filled 2018-08-19 (×7): qty 2

## 2018-08-19 MED ORDER — DILTIAZEM HCL ER COATED BEADS 240 MG PO CP24
240.0000 mg | ORAL_CAPSULE | Freq: Every day | ORAL | Status: DC
  Administered 2018-08-19 – 2018-08-22 (×4): 240 mg via ORAL
  Filled 2018-08-19 (×5): qty 1

## 2018-08-19 MED ORDER — WARFARIN SODIUM 2.5 MG PO TABS
2.5000 mg | ORAL_TABLET | Freq: Once | ORAL | Status: AC
  Administered 2018-08-19: 2.5 mg via ORAL
  Filled 2018-08-19: qty 1

## 2018-08-19 MED ORDER — DILTIAZEM HCL ER BEADS 240 MG PO CP24: 240.0000 mg | ORAL_CAPSULE | Freq: Every day | ORAL | Status: DC

## 2018-08-19 MED ORDER — MORPHINE SULFATE (PF) 2 MG/ML IV SOLN
2.0000 mg | Freq: Four times a day (QID) | INTRAVENOUS | Status: DC | PRN
  Administered 2018-08-19 – 2018-08-20 (×2): 2 mg via INTRAVENOUS
  Filled 2018-08-19 (×2): qty 1

## 2018-08-19 MED ORDER — LOSARTAN POTASSIUM 50 MG PO TABS: 100.0000 mg | ORAL_TABLET | Freq: Every day | ORAL | Status: DC

## 2018-08-19 NOTE — Progress Notes (Signed)
PROGRESS NOTE  Connie Ruiz DGU:[id number] DOB: [birth year] DOA: 08/14/2018 PCP: Patient, No Pcp Per   LOS: 3 days   Brief narrative: 58 year old female with history of prior CVA, diastolic CHF, type 2 diabetes mellitus, prior DVT and PE, hypertension, hyperlipidemia, lupus, kidney transplant on chronic immunosuppressive therapy, who presented to the hospital with flulike symptoms.  She reported 3-day history of fevers, nonproductive cough, runny nose and sore throat.  She also had been having generalized abdominal pain along with nausea, vomiting and diarrhea.  No recent travel or sick contacts.  Subjective: No interval complaints overnight for mild abdominal pain.  No nausea vomiting.  No urinary urgency frequency or dysuria.  Assessment/Plan:  Principal Problem:   Sepsis ([place]) Active Problems:   LUPUS   Pulmonary embolism ([facility])   Hypokalemia   Type 2 diabetes mellitus ([place])  Sepsis, present on admission.  Received IV cefepime, vancomycin and metronidazole empirically but was subsequently discontinued since the cultures were negative and there was no fever or subsequent leukocytosis.  On Po cefdinir to complete the course for possible bronchitis/ status post rhinovirus infection.    Blood cultures negative in 4 days.  No leukocytosis  Hypokalemia.  Continue to replenish.  Gradually improving.  Hypomagnesemia.  Improved with replacement.  Abdominal pain -Intermittent abdominal pain for several years.  CT scan of the abdomen showed some thickening of the jejunal area.  Patient likely has intolerance to certain foods.  Hypothyroidism TSH was elevated at 64.    Patient was on Synthroid 137 mcg at home which has been increased to 150 mcg. Patient will need to check TSH in 3 to 4 weeks as outpatient.    Vomiting with Gastroccult blood positive.  No further episodes of vomiting or hematemesis.  Hemoglobin today is 12.0.  Patient has history of DVT, pulmonary embolism, history of stroke and  requires Coumadin which was subtherapeutic to start with.  Hemoglobin has remained stable. on Protonix  p.o. twice daily.    History of PE and DVT -Patient is on Coumadin at home,  subtherapeutic on presentation.   Currently therapeutic at 2.3.  DVT studies in the ER were negative given right lower extremity swelling. Continue Coumadin per pharmacy.  Compliance was advised to the patient.  Patient has been weaned off the oxygen at this time.  History of lupus, renal transplant on chronic immunosuppressive therapy -CellCept and tacrolimus were held on admission, check tacrolimus level still pending.   CellCept and tacrolimus resumed. Continue home prednisone 5 mg daily.    Type 2 diabetes mellitus -Continue sliding scale, Accu-Cheks diabetic diet.  Closely monitor.  VTE Prophylaxis: Warfarin  Code Status: Full code  Family Communication: I spoke with the patient's daughter at bedside.  Disposition Plan: Skilled nursing facility in 2 to 3 days, Level II PASRR pending.  Medically stable for disposition  Consultants:  None  Procedures:  None  Antibiotics: Anti-infectives (From admission, onward)   Start     Dose/Rate Route Frequency Ordered Stop   08/17/18 1200  cefdinir (OMNICEF) capsule 300 mg     300 mg Oral Every 12 hours 08/17/18 1022     08/15/18 1900  vancomycin (VANCOCIN) 1,250 mg in sodium chloride 0.9 % 250 mL IVPB  Status:  Discontinued     1,250 mg 166.7 mL/hr over 90 Minutes Intravenous Every 24 hours 08/14/18 2055 08/15/18 0921   08/15/18 0200  metroNIDAZOLE (FLAGYL) IVPB 500 mg  Status:  Discontinued     500 mg 100 mL/hr over  60 Minutes Intravenous Every 8 hours 08/14/18 2039 08/17/18 1022   08/15/18 0100  ceFEPIme (MAXIPIME) 1 g in sodium chloride 0.9 % 100 mL IVPB  Status:  Discontinued     1 g 200 mL/hr over 30 Minutes Intravenous Every 8 hours 08/14/18 2055 08/17/18 1022   08/14/18 1600  ceFEPIme (MAXIPIME) 2 g in sodium chloride 0.9 % 100 mL IVPB     2  g 200 mL/hr over 30 Minutes Intravenous  Once 08/14/18 1548 08/14/18 1752   08/14/18 1600  metroNIDAZOLE (FLAGYL) IVPB 500 mg     500 mg 100 mL/hr over 60 Minutes Intravenous  Once 08/14/18 1548 08/14/18 1853   08/14/18 1600  vancomycin (VANCOCIN) IVPB 1000 mg/200 mL premix  Status:  Discontinued     1,000 mg 200 mL/hr over 60 Minutes Intravenous  Once 08/14/18 1548 08/14/18 1553   08/14/18 1600  vancomycin (VANCOCIN) 1,750 mg in sodium chloride 0.9 % 500 mL IVPB     1,750 mg 250 mL/hr over 120 Minutes Intravenous  Once 08/14/18 1553 08/14/18 2154      Objective: Vitals:   08/18/18 2155 08/19/18 0602  BP: (!) 158/96 (!) 150/103  Pulse: (!) 104 96  Resp: 18 16  Temp: 98.7 F (37.1 C) 98.6 F (37 C)  SpO2: 97% 94%    Intake/Output Summary (Last 24 hours) at 08/19/2018 1210 Last data filed at 08/19/2018 9211 Gross per 24 hour  Intake 240 ml  Output 800 ml  Net -560 ml   Filed Weights   08/14/18 1251 08/14/18 2248  Weight: 83.5 kg 83.4 kg   Body mass index is 31.55 kg/m.   Physical Exam: General:  Average built, not in obvious distress.slow to respond. HENT: Normocephalic, pupils equally reacting to light and accommodation.  No scleral pallor or icterus noted. Oral mucosa is moist.  Chest:  Clear breath sounds.  Diminished breath sounds bilaterally. No crackles or wheezes.  CVS: S1 &S2 heard. No murmur.  Regular rate and rhythm. Abdomen: Soft, nontender, nondistended.  Bowel sounds are heard.  Liver is not palpable, no abdominal mass palpated Extremities: No cyanosis, clubbing or edema.  Peripheral pulses are palpable. Psych: Alert, awake and oriented, normal mood CNS:  No cranial nerve deficits.  Moves all extremities. Skin: Warm and dry.  No rashes noted.  Data Review: I have personally reviewed the following laboratory data and studies,  CBC: Recent Labs  Lab 08/14/18 1324 08/16/18 0433 08/17/18 0338 08/18/18 0334 08/19/18 0350  WBC 10.4 10.7* 10.6* 8.5 9.8   HGB 13.6 11.5* 11.1* 11.4* 12.0  HCT 45.1 37.1 36.3 38.0 38.1  MCV 93.6 92.8 93.8 94.3 92.3  PLT 252 232 250 274 941   Basic Metabolic Panel: Recent Labs  Lab 08/14/18 2100 08/15/18 0454 08/16/18 0433 08/17/18 0338 08/18/18 0334 08/19/18 0350  NA  --  142 138 140 139 139  K  --  3.6 3.2* 3.3* 3.3* 3.4*  CL  --  114* 105 108 103 103  CO2  --  23 22 26 26 28   GLUCOSE  --  106* 105* 96 85 91  BUN  --  15 7 6  <5* <5*  CREATININE  --  0.60 0.58 0.57 0.49 0.50  CALCIUM  --  8.0* 7.7* 7.8* 7.9* 7.9*  MG 1.6*  --   --  1.5* 1.8  --    Liver Function Tests: Recent Labs  Lab 08/14/18 1324 08/17/18 0338  AST 16 12*  ALT 9 7  ALKPHOS  76 50  BILITOT 1.6* 0.7  PROT 6.9 5.4*  ALBUMIN 3.6 2.6*   Recent Labs  Lab 08/14/18 1324  LIPASE 30   No results for input(s): AMMONIA in the last 168 hours. Cardiac Enzymes: No results for input(s): CKTOTAL, CKMB, CKMBINDEX, TROPONINI in the last 168 hours. BNP (last 3 results) No results for input(s): BNP in the last 8760 hours.  ProBNP (last 3 results) No results for input(s): PROBNP in the last 8760 hours.  CBG: Recent Labs  Lab 08/18/18 1128 08/18/18 1620 08/18/18 2156 08/19/18 0742 08/19/18 1142  GLUCAP 127* 141* 122* 80 204*   Recent Results (from the past 240 hour(s))  Blood Culture (routine x 2)     Status: None (Preliminary result)   Collection Time: 08/14/18  3:47 PM  Result Value Ref Range Status   Specimen Description   Final    BLOOD RIGHT ANTECUBITAL Performed at [facility], [place] [address], [place], [place] [address]    Special Requests   Final    BOTTLES DRAWN AEROBIC AND ANAEROBIC Blood Culture adequate volume Performed at [place] [address] [place], [place] [address]    Culture   Final    NO GROWTH 4 DAYS Performed at [facility], [place] [address] [place], [place] [address]    Report Status PENDING  Incomplete  Blood Culture (routine x 2)      Status: None (Preliminary result)   Collection Time: 08/14/18  3:52 PM  Result Value Ref Range Status   Specimen Description   Final    BLOOD LEFT ANTECUBITAL Performed at [place] [address] [place], [place] [address]    Special Requests   Final    BOTTLES DRAWN AEROBIC AND ANAEROBIC Blood Culture adequate volume Performed at [place] [address] [place], [place] [address]    Culture   Final    NO GROWTH 4 DAYS Performed at [facility], [place] [address] [place], [place] [address]    Report Status PENDING  Incomplete  Respiratory Panel by PCR     Status: Abnormal   Collection Time: 08/14/18  6:46 PM  Result Value Ref Range Status   Adenovirus NOT DETECTED NOT DETECTED Final   Coronavirus 229E NOT DETECTED NOT DETECTED Final    Comment: (NOTE) The Coronavirus on the Respiratory Panel, DOES NOT test for the novel  Coronavirus (2019 nCoV)    Coronavirus HKU1 NOT DETECTED NOT DETECTED Final   Coronavirus NL63 NOT DETECTED NOT DETECTED Final   Coronavirus OC43 NOT DETECTED NOT DETECTED Final   Metapneumovirus NOT DETECTED NOT DETECTED Final   Rhinovirus / Enterovirus DETECTED (A) NOT DETECTED Final   Influenza A NOT DETECTED NOT DETECTED Final   Influenza B NOT DETECTED NOT DETECTED Final   Parainfluenza Virus 1 NOT DETECTED NOT DETECTED Final   Parainfluenza Virus 2 NOT DETECTED NOT DETECTED Final   Parainfluenza Virus 3 NOT DETECTED NOT DETECTED Final   Parainfluenza Virus 4 NOT DETECTED NOT DETECTED Final   Respiratory Syncytial Virus NOT DETECTED NOT DETECTED Final   Bordetella pertussis NOT DETECTED NOT DETECTED Final   Chlamydophila pneumoniae NOT DETECTED NOT DETECTED Final   Mycoplasma pneumoniae NOT DETECTED NOT DETECTED Final    Comment: Performed at [facility], [place]. [address], [place], [place] [address]     Studies: No results found.  Scheduled Meds: . atorvastatin  40 mg Oral q1800  .  calcitRIOL  0.25 mcg Oral QODAY  . cefdinir  300 mg Oral Q12H  . diltiazem  240 mg Oral Daily  . feeding supplement  1 Container Oral BID BM  . insulin aspart  0-9 Units Subcutaneous TID WC  . levothyroxine  150 mcg Oral Q0600  . losartan  100 mg Oral Daily  . mycophenolate  250 mg Oral BID  . pantoprazole  40 mg Oral BID AC  . potassium chloride  40 mEq Oral BID  . predniSONE  5 mg Oral Q breakfast  . sucralfate  1 g Oral TID WC & HS  . tacrolimus  1 mg Oral QHS   And  . tacrolimus  2 mg Oral Daily  . warfarin  2.5 mg Oral ONCE-1800  . Warfarin - Pharmacist Dosing Inpatient   Does not apply q1800    Continuous Infusions: . sodium chloride 0.45 % 1,000 mL with potassium chloride 40 mEq infusion 75 mL/hr at 08/19/18 0155     Flora Lipps, MD  [facility] 08/19/2018

## 2018-08-19 NOTE — Progress Notes (Signed)
[place] for Warfarin (on warfarin PTA) Indication: H/O PE and DVT  Allergies  Allergen Reactions  . Oxycodone-Acetaminophen Shortness Of Breath and Nausea Only  . Propoxyphene Nausea Only and Shortness Of Breath    States takes tylenol at home  . Propoxyphene N-Acetaminophen Shortness Of Breath and Nausea Only  . Sulfonamide Derivatives Shortness Of Breath and Nausea Only  . Codeine Nausea Only  . Hydrocodone-Acetaminophen   . Hydromorphone   . Other Other (See Comments)    No blood, Jehovaeh Witness   . Oxycodone   . Sulfamethoxazole   . Tape     Redness**PAPER TAPE OK**  . Gabapentin Anxiety    twitching  . Latex Rash  . Metoprolol Rash  . Morphine And Related Rash    IV site on arm is red, patient reports this is improving.  NO shortness of breath reported.  . Rosiglitazone Rash    Patient Measurements: Height: 5\' 4"  (162.6 cm) Weight: 183 lb 12.8 oz (83.4 kg) IBW/kg (Calculated) : 54.7   Vital Signs: Temp: 98.6 F (37 C) (03/14 0602) Temp Source: Oral (03/14 0602) BP: 150/103 (03/14 0602) Pulse Rate: 96 (03/14 0602)  Labs: Recent Labs    08/17/18 0338 08/18/18 0334 08/19/18 0350  HGB 11.1* 11.4* 12.0  HCT 36.3 38.0 38.1  PLT 250 274 299  LABPROT 18.7* 21.6* 24.8*  INR 1.6* 1.9* 2.3*  CREATININE 0.57 0.49 0.50    Estimated Creatinine Clearance: 81.1 mL/min (by C-G formula based on SCr of 0.5 mg/dL).   Medical History: Past Medical History:  Diagnosis Date  . Anxiety   . Candida esophagitis ([place]) 11/12/2014  . CEREBROVASCULAR ACCIDENT, ACUTE 04/15/2010  . CLOSTRIDIUM DIFFICILE COLITIS, HX OF 08/21/2007  . CONGESTIVE HEART FAILURE 08/21/2007  . Current use of long term anticoagulation    Dr. Andree Elk, [facility]  . CVA 04/17/2010  . Depression    Dr. Andree Elk, [place], TYPE II 08/21/2007  . DVT, HX OF 08/21/2007  . GERD 08/21/2007  . GOUT 08/21/2007  . History of stroke with residual  effects   . HYPERLIPIDEMIA 08/21/2007  . HYPERTENSION 08/21/2007   Dr. Andree Elk, [place], HX OF 08/22/2007   s/p renal transplant-Dr. Andree Elk, [place] 08/21/2007  . OSTEOPOROSIS 08/21/2007   [facility]umatol at [facility]  . Pulmonary embolism ([place]) 07/16/2010  . Renal failure   . RENAL INSUFFICIENCY 08/21/2007  . Right sided weakness   . Steroid-induced hyperglycemia 11/09/2014  . Tachycardia   . THYROID NODULE, LEFT 04/10/2009    Medications:  PTA Warfarin regimen:  2.5mg  daily except 5mg  on Mon & Fri  Assessment: 58 y.o. female admitted on 08/14/2018 with sepsis.  PMH significant for lupus, kidney transplant, h/o PE & DVT. Pharmacy consulted to continue dosing warfarin on admission.  Today, 08/19/2018  INR today therapeutic but rising to goal- 2.3  CBC: Hgb 12, Plts wnl  Drug interactions: broad spectrum abx transitioned to cefdinir - may potentially increase INR  CLD ordered-  75% of breakfast eaten   Significant events: 3/10: coffee ground emesis, gastric contents heme (+), warfarin dose held 3/11: discussed above events with Dr. Louanne Belton, ok to proceed with warfarin today since Hgb did not significantly drop; will not use lovenox bridging at this time 3/13 No further bleeding issues per rounds   Goal of Therapy:  INR 2-3   Plan:   Warfarin 2.5 mg po x 1 dose  Check daily PT/INR  Monitor for signs and symptoms of bleeding    Royetta Asal, PharmD, BCPS Pager [phone] 08/19/2018 6:57 AM

## 2018-08-20 ENCOUNTER — Other Ambulatory Visit: Payer: Self-pay | Admitting: Endocrinology

## 2018-08-20 LAB — BASIC METABOLIC PANEL
Anion gap: 7 (ref 5–15)
BUN: <5 mg/dL — ABNORMAL LOW (ref 6–20)
CO2: 27 mmol/L (ref 22–32)
Calcium: 8.3 mg/dL — ABNORMAL LOW (ref 8.9–10.3)
Chloride: 108 mmol/L (ref 98–111)
Creatinine, Ser: 0.94 mg/dL (ref 0.44–1.00)
GFR calc Af Amer: >60 mL/min (ref >60)
GFR calc non Af Amer: >60 mL/min (ref >60)
Glucose, Bld: 98 mg/dL (ref 70–99)
Potassium: 3.8 mmol/L (ref 3.5–5.1)
Sodium: 142 mmol/L (ref 135–145)

## 2018-08-20 LAB — CBC
HCT: 37.4 % (ref 36.0–46.0)
Hemoglobin: 11.2 g/dL — ABNORMAL LOW (ref 12.0–15.0)
MCH: 28.2 pg (ref 26.0–34.0)
MCHC: 29.9 g/dL — ABNORMAL LOW (ref 30.0–36.0)
MCV: 94.2 fL (ref 80.0–100.0)
Platelets: 329 10*3/uL (ref 150–400)
RBC: 3.97 MIL/uL (ref 3.87–5.11)
RDW: 14.8 % (ref 11.5–15.5)
WBC: 9.6 10*3/uL (ref 4.0–10.5)
nRBC: 0 % (ref 0.0–0.2)

## 2018-08-20 LAB — GLUCOSE, CAPILLARY
Glucose-Capillary: 100 mg/dL — ABNORMAL HIGH (ref 70–99)
Glucose-Capillary: 183 mg/dL — ABNORMAL HIGH (ref 70–99)
Glucose-Capillary: 210 mg/dL — ABNORMAL HIGH (ref 70–99)
Glucose-Capillary: 141 mg/dL — ABNORMAL HIGH (ref 70–99)

## 2018-08-20 LAB — PROTIME-INR
INR: 2.6 — ABNORMAL HIGH (ref 0.8–1.2)
Prothrombin Time: 27.6 seconds — ABNORMAL HIGH (ref 11.4–15.2)

## 2018-08-20 MED ORDER — MORPHINE SULFATE (PF) 2 MG/ML IV SOLN
1.0000 mg | Freq: Four times a day (QID) | INTRAVENOUS | Status: DC | PRN
  Administered 2018-08-20 – 2018-08-22 (×7): 1 mg via INTRAVENOUS
  Filled 2018-08-20 (×7): qty 1

## 2018-08-20 MED ORDER — WARFARIN SODIUM 2.5 MG PO TABS
2.5000 mg | ORAL_TABLET | Freq: Once | ORAL | Status: AC
  Administered 2018-08-20: 2.5 mg via ORAL
  Filled 2018-08-20: qty 1

## 2018-08-20 NOTE — Progress Notes (Signed)
[place] for Warfarin (on warfarin PTA) Indication: H/O PE and DVT  Allergies  Allergen Reactions  . Oxycodone-Acetaminophen Shortness Of Breath and Nausea Only  . Propoxyphene Nausea Only and Shortness Of Breath    States takes tylenol at home  . Propoxyphene N-Acetaminophen Shortness Of Breath and Nausea Only  . Sulfonamide Derivatives Shortness Of Breath and Nausea Only  . Codeine Nausea Only  . Hydrocodone-Acetaminophen   . Hydromorphone   . Other Other (See Comments)    No blood, Jehovaeh Witness   . Oxycodone   . Sulfamethoxazole   . Tape     Redness**PAPER TAPE OK**  . Gabapentin Anxiety    twitching  . Latex Rash  . Metoprolol Rash  . Morphine And Related Rash    IV site on arm is red, patient reports this is improving.  NO shortness of breath reported.  . Rosiglitazone Rash    Patient Measurements: Height: 5\' 4"  (162.6 cm) Weight: 183 lb 12.8 oz (83.4 kg) IBW/kg (Calculated) : 54.7   Vital Signs: Temp: 98.5 F (36.9 C) (03/15 0544) Temp Source: Oral (03/15 0544) BP: 129/91 (03/15 0544) Pulse Rate: 99 (03/15 0544)  Labs: Recent Labs    08/18/18 0334 08/19/18 0350 08/20/18 0433  HGB 11.4* 12.0 11.2*  HCT 38.0 38.1 37.4  PLT 274 299 329  LABPROT 21.6* 24.8* 27.6*  INR 1.9* 2.3* 2.6*  CREATININE 0.49 0.50 0.94    Estimated Creatinine Clearance: 69 mL/min (by C-G formula based on SCr of 0.94 mg/dL).   Medical History: Past Medical History:  Diagnosis Date  . Anxiety   . Candida esophagitis ([place]) 11/12/2014  . CEREBROVASCULAR ACCIDENT, ACUTE 04/15/2010  . CLOSTRIDIUM DIFFICILE COLITIS, HX OF 08/21/2007  . CONGESTIVE HEART FAILURE 08/21/2007  . Current use of long term anticoagulation    Dr. Andree Elk, [facility]  . CVA 04/17/2010  . Depression    Dr. Andree Elk, [place], TYPE II 08/21/2007  . DVT, HX OF 08/21/2007  . GERD 08/21/2007  . GOUT 08/21/2007  . History of stroke with residual  effects   . HYPERLIPIDEMIA 08/21/2007  . HYPERTENSION 08/21/2007   Dr. Andree Elk, [place], HX OF 08/22/2007   s/p renal transplant-Dr. Andree Elk, [place] 08/21/2007  . OSTEOPOROSIS 08/21/2007   [facility] at [facility]  . Pulmonary embolism ([place]) 07/16/2010  . Renal failure   . RENAL INSUFFICIENCY 08/21/2007  . Right sided weakness   . Steroid-induced hyperglycemia 11/09/2014  . Tachycardia   . THYROID NODULE, LEFT 04/10/2009    Medications:  PTA Warfarin regimen:  2.5mg  daily except 5mg  on Mon & Fri  Assessment: 58 y.o. female admitted on 08/14/2018 with sepsis.  PMH significant for lupus, kidney transplant, h/o PE & DVT. Pharmacy consulted to continue dosing warfarin on admission.  Today, 08/20/2018  INR today therapeutic  2.6  CBC: Hgb 11.2, Plts wnl  Drug interactions: broad spectrum abx transitioned to cefdinir - may potentially increase INR  CLD ordered-  75% of breakfast eaten   Significant events: 3/10: coffee ground emesis, gastric contents heme (+), warfarin dose held 3/11: discussed above events with Dr. Louanne Belton, ok to proceed with warfarin today since Hgb did not significantly drop; will not use lovenox bridging at this time 3/13 No further bleeding issues per rounds   Goal of Therapy:  INR 2-3   Plan:   Warfarin 2.5 mg po x 1 dose  Check daily PT/INR  Monitor for  signs and symptoms of bleeding    Royetta Asal, PharmD, BCPS Pager [phone] 08/20/2018 11:50 AM

## 2018-08-20 NOTE — Telephone Encounter (Signed)
Please refill x 3 months Further refills would have to be considered by new PCP   

## 2018-08-20 NOTE — Progress Notes (Signed)
PROGRESS NOTE  Connie Ruiz:[id number] DOB: 03-Mar-1961 DOA: 08/14/2018 PCP: Patient, No Pcp Per   LOS: 4 days   Brief narrative: 58 year old female with history of prior CVA, diastolic CHF, type 2 diabetes mellitus, prior DVT and PE, hypertension, hyperlipidemia, lupus, kidney transplant on chronic immunosuppressive therapy, who presented to the hospital with flulike symptoms.  She reported 3-day history of fevers, nonproductive cough, runny nose and sore throat.  She also had been having generalized abdominal pain along with nausea, vomiting and diarrhea.  No recent travel or sick contacts.  Subjective: Patient denies interval complaints overnight.  Denies any nausea vomiting but has mild abdominal discomfort.  Assessment/Plan:  Principal Problem:   Sepsis ([place]) Active Problems:   LUPUS   Pulmonary embolism ([facility])   Hypokalemia   Type 2 diabetes mellitus ([place])  Sepsis, present on admission likely secondary to respiratory tract infection.  Received IV cefepime, vancomycin and metronidazole empirically but was subsequently discontinued since the cultures were negative and there was no fever or subsequent leukocytosis.  On Po cefdinir to complete the course for possible bronchitis/ status post rhinovirus infection.    Blood cultures negative so for.  No leukocytosis.  She is afebrile.  Hypokalemia.  Continue to replenish.  BMP.  Hypomagnesemia.  Improved with replacement.  Abdominal pain -Intermittent abdominal pain for several years.  CT scan of the abdomen showed some thickening of the jejunal area.   Hypothyroidism TSH was elevated at 64.    Patient was on Synthroid 137 mcg at home which has been increased to 150 mcg. Patient will need to check TSH in 3 to 4 weeks as outpatient.    Vomiting with Gastroccult blood positive.  One episode.  Self resolved.  Continue Protonix.  History of PE and DVT -Patient is on Coumadin at home,  subtherapeutic on presentation.   Currently  therapeutic INR at 2.6.  DVT studies in the ER were negative given right lower extremity swelling. Continue Coumadin per pharmacy.   History of lupus, renal transplant on chronic immunosuppressive therapy Continue CellCept and tacrolimus prednisone.     Type 2 diabetes mellitus -Continue sliding scale, Accu-Cheks diabetic diet.  Closely monitor.  Glycemic goal is adequate  VTE Prophylaxis: Warfarin  Code Status: Full code  Family Communication: No one is available at bedside  Disposition Plan: Skilled nursing facility when bed available.  Level II PASRR pending.  Medically stable for disposition  Consultants:  None  Procedures:  None  Antibiotics: Anti-infectives (From admission, onward)   Start     Dose/Rate Route Frequency Ordered Stop   08/17/18 1200  cefdinir (OMNICEF) capsule 300 mg     300 mg Oral Every 12 hours 08/17/18 1022     08/15/18 1900  vancomycin (VANCOCIN) 1,250 mg in sodium chloride 0.9 % 250 mL IVPB  Status:  Discontinued     1,250 mg 166.7 mL/hr over 90 Minutes Intravenous Every 24 hours 08/14/18 2055 08/15/18 0921   08/15/18 0200  metroNIDAZOLE (FLAGYL) IVPB 500 mg  Status:  Discontinued     500 mg 100 mL/hr over 60 Minutes Intravenous Every 8 hours 08/14/18 2039 08/17/18 1022   08/15/18 0100  ceFEPIme (MAXIPIME) 1 g in sodium chloride 0.9 % 100 mL IVPB  Status:  Discontinued     1 g 200 mL/hr over 30 Minutes Intravenous Every 8 hours 08/14/18 2055 08/17/18 1022   08/14/18 1600  ceFEPIme (MAXIPIME) 2 g in sodium chloride 0.9 % 100 mL IVPB     2  g 200 mL/hr over 30 Minutes Intravenous  Once 08/14/18 1548 08/14/18 1752   08/14/18 1600  metroNIDAZOLE (FLAGYL) IVPB 500 mg     500 mg 100 mL/hr over 60 Minutes Intravenous  Once 08/14/18 1548 08/14/18 1853   08/14/18 1600  vancomycin (VANCOCIN) IVPB 1000 mg/200 mL premix  Status:  Discontinued     1,000 mg 200 mL/hr over 60 Minutes Intravenous  Once 08/14/18 1548 08/14/18 1553   08/14/18 1600  vancomycin  (VANCOCIN) 1,750 mg in sodium chloride 0.9 % 500 mL IVPB     1,750 mg 250 mL/hr over 120 Minutes Intravenous  Once 08/14/18 1553 08/14/18 2154      Objective: Vitals:   08/20/18 0145 08/20/18 0544  BP: (!) 131/92 (!) 129/91  Pulse: 100 99  Resp: 17 20  Temp: 98.4 F (36.9 C) 98.5 F (36.9 C)  SpO2: 98% 96%    Intake/Output Summary (Last 24 hours) at 08/20/2018 1004 Last data filed at 08/20/2018 0900 Gross per 24 hour  Intake 1469.69 ml  Output 1550 ml  Net -80.31 ml   Filed Weights   08/14/18 1251 08/14/18 2248  Weight: 83.5 kg 83.4 kg   Body mass index is 31.55 kg/m.   Physical Exam: General:  Average built, not in obvious distress.  Slow to respond.  Mild expressive aphasia, chronic.  Obese HENT: Normocephalic, pupils equally reacting to light and accommodation.  No scleral pallor or icterus noted. Oral mucosa is moist.  Chest:  Clear breath sounds.  Diminished breath sounds bilaterally. No crackles or wheezes.  CVS: S1 &S2 heard. No murmur.  Regular rate and rhythm. Abdomen: Soft, nonspecific tenderness noted, nondistended.  Bowel sounds are heard.  Liver is not palpable, no abdominal mass palpated Extremities: No cyanosis, clubbing or edema.  Peripheral pulses are palpable. Psych: Alert, awake and oriented, normal mood CNS:  No cranial nerve deficits.  Moves all extremities.   Skin: Warm and dry.  No rashes noted.    Data Review: I have personally reviewed the following laboratory data and studies,  CBC: Recent Labs  Lab 08/16/18 0433 08/17/18 0338 08/18/18 0334 08/19/18 0350 08/20/18 0433  WBC 10.7* 10.6* 8.5 9.8 9.6  HGB 11.5* 11.1* 11.4* 12.0 11.2*  HCT 37.1 36.3 38.0 38.1 37.4  MCV 92.8 93.8 94.3 92.3 94.2  PLT 232 250 274 299 466   Basic Metabolic Panel: Recent Labs  Lab 08/14/18 2100  08/16/18 0433 08/17/18 0338 08/18/18 0334 08/19/18 0350 08/20/18 0433  NA  --    < > 138 140 139 139 142  K  --    < > 3.2* 3.3* 3.3* 3.4* 3.8  CL  --    <  > 105 108 103 103 108  CO2  --    < > 22 26 26 28 27   GLUCOSE  --    < > 105* 96 85 91 98  BUN  --    < > 7 6 <5* <5* <5*  CREATININE  --    < > 0.58 0.57 0.49 0.50 0.94  CALCIUM  --    < > 7.7* 7.8* 7.9* 7.9* 8.3*  MG 1.6*  --   --  1.5* 1.8  --   --    < > = values in this interval not displayed.   Liver Function Tests: Recent Labs  Lab 08/14/18 1324 08/17/18 0338  AST 16 12*  ALT 9 7  ALKPHOS 76 50  BILITOT 1.6* 0.7  PROT 6.9 5.4*  ALBUMIN 3.6 2.6*   Recent Labs  Lab 08/14/18 1324  LIPASE 30   No results for input(s): AMMONIA in the last 168 hours. Cardiac Enzymes: No results for input(s): CKTOTAL, CKMB, CKMBINDEX, TROPONINI in the last 168 hours. BNP (last 3 results) No results for input(s): BNP in the last 8760 hours.  ProBNP (last 3 results) No results for input(s): PROBNP in the last 8760 hours.  CBG: Recent Labs  Lab 08/19/18 0742 08/19/18 1142 08/19/18 1549 08/19/18 2215 08/20/18 0743  GLUCAP 80 204* 156* 123* 100*   Recent Results (from the past 240 hour(s))  Blood Culture (routine x 2)     Status: None   Collection Time: 08/14/18  3:47 PM  Result Value Ref Range Status   Specimen Description   Final    BLOOD RIGHT ANTECUBITAL Performed at [facility] [address], [place], [place] [address]    Special Requests   Final    BOTTLES DRAWN AEROBIC AND ANAEROBIC Blood Culture adequate volume Performed at [facility] [address], [place], [place] [address]    Culture   Final    NO GROWTH 5 DAYS Performed at [facility], [place] [address] [place], [place] [address]    Report Status 08/19/2018 FINAL  Final  Blood Culture (routine x 2)     Status: None   Collection Time: 08/14/18  3:52 PM  Result Value Ref Range Status   Specimen Description   Final    BLOOD LEFT ANTECUBITAL Performed at [facility] [address], [place], [place] [address]    Special Requests   Final     BOTTLES DRAWN AEROBIC AND ANAEROBIC Blood Culture adequate volume Performed at [facility] [address], [place], [place] [address]    Culture   Final    NO GROWTH 5 DAYS Performed at [facility], [place] [address] [place], [place] [address]    Report Status 08/19/2018 FINAL  Final  Respiratory Panel by PCR     Status: Abnormal   Collection Time: 08/14/18  6:46 PM  Result Value Ref Range Status   Adenovirus NOT DETECTED NOT DETECTED Final   Coronavirus 229E NOT DETECTED NOT DETECTED Final    Comment: (NOTE) The Coronavirus on the Respiratory Panel, DOES NOT test for the novel  Coronavirus (2019 nCoV)    Coronavirus HKU1 NOT DETECTED NOT DETECTED Final   Coronavirus NL63 NOT DETECTED NOT DETECTED Final   Coronavirus OC43 NOT DETECTED NOT DETECTED Final   Metapneumovirus NOT DETECTED NOT DETECTED Final   Rhinovirus / Enterovirus DETECTED (A) NOT DETECTED Final   Influenza A NOT DETECTED NOT DETECTED Final   Influenza B NOT DETECTED NOT DETECTED Final   Parainfluenza Virus 1 NOT DETECTED NOT DETECTED Final   Parainfluenza Virus 2 NOT DETECTED NOT DETECTED Final   Parainfluenza Virus 3 NOT DETECTED NOT DETECTED Final   Parainfluenza Virus 4 NOT DETECTED NOT DETECTED Final   Respiratory Syncytial Virus NOT DETECTED NOT DETECTED Final   Bordetella pertussis NOT DETECTED NOT DETECTED Final   Chlamydophila pneumoniae NOT DETECTED NOT DETECTED Final   Mycoplasma pneumoniae NOT DETECTED NOT DETECTED Final    Comment: Performed at [facility], [place]. [address] [place], [place] [address]     Studies: No results found.  Scheduled Meds: . atorvastatin  40 mg Oral q1800  . calcitRIOL  0.25 mcg Oral QODAY  . cefdinir  300 mg Oral Q12H  .  diltiazem  240 mg Oral Daily  . feeding supplement  1 Container Oral BID BM  . insulin aspart  0-9 Units Subcutaneous TID WC  . levothyroxine  150 mcg Oral Q0600  . losartan  100 mg Oral Daily  . mycophenolate  250 mg  Oral BID  . pantoprazole  40 mg Oral BID AC  . potassium chloride  40 mEq Oral BID  . predniSONE  5 mg Oral Q breakfast  . sucralfate  1 g Oral TID WC & HS  . tacrolimus  1 mg Oral QHS   And  . tacrolimus  2 mg Oral Daily  . Warfarin - Pharmacist Dosing Inpatient   Does not apply q1800    Continuous Infusions: . sodium chloride 0.45 % 1,000 mL with potassium chloride 40 mEq infusion 75 mL/hr at 08/20/18 0554     Flora Lipps, MD  [facility] 08/20/2018

## 2018-08-21 LAB — GLUCOSE, CAPILLARY
Glucose-Capillary: 109 mg/dL — ABNORMAL HIGH (ref 70–99)
Glucose-Capillary: 189 mg/dL — ABNORMAL HIGH (ref 70–99)
Glucose-Capillary: 178 mg/dL — ABNORMAL HIGH (ref 70–99)
Glucose-Capillary: 229 mg/dL — ABNORMAL HIGH (ref 70–99)

## 2018-08-21 LAB — BASIC METABOLIC PANEL
Anion gap: 8 (ref 5–15)
BUN: <5 mg/dL — ABNORMAL LOW (ref 6–20)
CO2: 27 mmol/L (ref 22–32)
Calcium: 8.7 mg/dL — ABNORMAL LOW (ref 8.9–10.3)
Chloride: 108 mmol/L (ref 98–111)
Creatinine, Ser: 0.85 mg/dL (ref 0.44–1.00)
GFR calc Af Amer: >60 mL/min (ref >60)
GFR calc non Af Amer: >60 mL/min (ref >60)
Glucose, Bld: 131 mg/dL — ABNORMAL HIGH (ref 70–99)
Potassium: 3.4 mmol/L — ABNORMAL LOW (ref 3.5–5.1)
Sodium: 143 mmol/L (ref 135–145)

## 2018-08-21 LAB — CBC
HCT: 40 % (ref 36.0–46.0)
Hemoglobin: 11.8 g/dL — ABNORMAL LOW (ref 12.0–15.0)
MCH: 27.9 pg (ref 26.0–34.0)
MCHC: 29.5 g/dL — ABNORMAL LOW (ref 30.0–36.0)
MCV: 94.6 fL (ref 80.0–100.0)
Platelets: 336 10*3/uL (ref 150–400)
RBC: 4.23 MIL/uL (ref 3.87–5.11)
RDW: 15.4 % (ref 11.5–15.5)
WBC: 9.8 10*3/uL (ref 4.0–10.5)
nRBC: 0 % (ref 0.0–0.2)

## 2018-08-21 LAB — PROTIME-INR
INR: 3.1 — ABNORMAL HIGH (ref 0.8–1.2)
Prothrombin Time: 31.3 seconds — ABNORMAL HIGH (ref 11.4–15.2)

## 2018-08-21 MED ORDER — WARFARIN SODIUM 1 MG PO TABS
1.0000 mg | ORAL_TABLET | Freq: Once | ORAL | Status: AC
  Administered 2018-08-21: 1 mg via ORAL
  Filled 2018-08-21: qty 1

## 2018-08-21 NOTE — Care Management Important Message (Signed)
Important Message  Patient Details  Name: Connie Ruiz MRN: [id number] Date of Birth: December 11, 1960   Medicare Important Message Given:  Yes    Kerin Salen 08/21/2018, 12:58 [place] Message  Patient Details  Name: Connie Ruiz MRN: [id number] Date of Birth: 1960/09/24   Medicare Important Message Given:  Yes    Kerin Salen 08/21/2018, 12:58 PM

## 2018-08-21 NOTE — Plan of Care (Signed)

## 2018-08-21 NOTE — Progress Notes (Signed)
Physical Therapy Treatment Patient Details Name: Connie Ruiz MRN: [id number] DOB: 05-Dec-1960 Today's Date: 08/21/2018    History of Present Illness 58 yo female admitted with sepsis. Hx of CVA-R residual weakness, CHF, DM, DVT, PE, lupus, kidney transplant    PT Comments    Pt assisted with ambulating however distance limited due to fatigue and tachycardia.   HR during session - at rest 101 bpm, during ambulation 155 bpm, and upon end of session 128 bpm.  Follow Up Recommendations  SNF;Supervision/Assistance - 24 hour     Equipment Recommendations  None recommended by PT    Recommendations for Other Services       Precautions / Restrictions Precautions Precautions: Fall Precaution Comments: monitor HR and O2    Mobility  Bed Mobility Overal bed mobility: Needs Assistance Bed Mobility: Supine to Sit;Sit to Supine     Supine to sit: Min guard;HOB elevated Sit to supine: Mod assist   General bed mobility comments: pt requested assist for LEs onto bed  Transfers Overall transfer level: Needs assistance Equipment used: Rolling walker (2 wheeled) Transfers: Sit to/from Stand Sit to Stand: Min assist         General transfer comment: Assist to rise, stabilize, control descent. verbal cues for hand placement and safe technique  Ambulation/Gait Ambulation/Gait assistance: Min assist Gait Distance (Feet): 22 Feet Assistive device: Rolling walker (2 wheeled) Gait Pattern/deviations: Step-through pattern;Decreased stride length     General Gait Details: very slow speed, HR up to 155 bpm so distance limited however pt also fatigued quickly, SPO2  94% on room air upon returning to bed   Stairs             Wheelchair Mobility    Modified Rankin (Stroke Patients Only)       Balance Overall balance assessment: Needs assistance         Standing balance support: Bilateral upper extremity supported Standing balance-Leahy Scale: Poor                               Cognition Arousal/Alertness: Awake/alert Behavior During Therapy: WFL for tasks assessed/performed Overall Cognitive Status: Impaired/Different from baseline                               Problem Solving: Slow processing;Requires verbal cues;Requires tactile cues General Comments: has aphasia      Exercises      General Comments        Pertinent Vitals/Pain Pain Assessment: Faces Faces Pain Scale: Hurts even more Pain Location: pt points to abdomen    Home Living                      Prior Function            PT Goals (current goals can now be found in the care plan section) Progress towards PT goals: Progressing toward goals    Frequency    Min 3X/week      PT Plan Current plan remains appropriate    Co-evaluation              AM-PAC PT "6 Clicks" Mobility   Outcome Measure  Help needed turning from your back to your side while in a flat bed without using bedrails?: A Little Help needed moving from lying on your back to sitting on the side of a flat bed  without using bedrails?: A Little Help needed moving to and from a bed to a chair (including a wheelchair)?: A Little Help needed standing up from a chair using your arms (e.g., wheelchair or bedside chair)?: A Little Help needed to walk in hospital room?: A Little Help needed climbing 3-5 steps with a railing? : A Lot 6 Click Score: 17    End of Session Equipment Utilized During Treatment: Gait belt Activity Tolerance: Patient limited by fatigue;Treatment limited secondary to medical complications (Comment) Patient left: with call bell/phone within reach;in bed;with bed alarm set Nurse Communication: Mobility status PT Visit Diagnosis: Muscle weakness (generalized) (M62.81);Difficulty in walking, not elsewhere classified (R26.2);Unsteadiness on feet (R26.81)     Time: 0950-1006 PT Time Calculation (min) (ACUTE ONLY): 16 min  Charges:  $Gait  Training: 8-22 mins                     Carmelia Bake, PT, DPT Acute Rehabilitation Services Office: [phone] Pager: [phone]  Trena Platt 08/21/2018, 12:41 PM

## 2018-08-21 NOTE — Progress Notes (Signed)
PROGRESS NOTE  Connie Ruiz IRS:[id number] DOB: [birth year] DOA: 08/14/2018 PCP: Patient, No Pcp Per   LOS: 5 days   Brief narrative: 58 year old female with history of prior CVA, diastolic CHF, type 2 diabetes mellitus, prior DVT and PE, hypertension, hyperlipidemia, lupus, kidney transplant on chronic immunosuppressive therapy, who presented to the hospital with flulike symptoms.  She reported 3-day history of fevers, nonproductive cough, runny nose and sore throat. Patient also had been having generalized abdominal pain along with nausea, vomiting and diarrhea.  No recent travel or sick contacts.  Subjective: Patient complains of mild abdominal discomfort.  Denies any nausea vomiting.  Denies shortness of breath.  Assessment/Plan:  Principal Problem:   Sepsis ([place]) Active Problems:   LUPUS   Pulmonary embolism (H[facility])   Hypokalemia   Type 2 diabetes mellitus ([place])  Sepsis, present on admission likely secondary to respiratory tract infection.  Received IV cefepime, vancomycin and metronidazole empirically but was subsequently discontinued since the cultures were negative and there was no fever or subsequent leukocytosis.  Completed Po cefdinir.   Blood cultures negative so for.  No leukocytosis.    Hypokalemia.  Continue to replenish orally.  Hypomagnesemia.  Improved with replacement.  Abdominal pain -Intermittent abdominal pain for several years.  CT scan of the abdomen showed some thickening of the jejunal area.   Hypothyroidism TSH was elevated at 64.  Patient was on Synthroid 137 mcg at home which has been increased to 150 mcg. Patient will need to check TSH in 3 to 4 weeks as outpatient.    Vomiting with Gastroccult blood positive.  One episode.  Self resolved.  Continue Protonix.  History of PE and DVT -Patient is on Coumadin at home,  subtherapeutic on presentation.   Currently therapeutic INR at 2.6.  DVT studies in the ER were negative given right lower extremity  swelling. Continue Coumadin per pharmacy.   History of lupus, renal transplant on chronic immunosuppressive therapy Continue CellCept and tacrolimus prednisone.     Type 2 diabetes mellitus -Continue sliding scale, Accu-Cheks diabetic diet.  Closely monitor.  Glycemic goal is adequate  VTE Prophylaxis: Warfarin  Code Status: Full code  Family Communication: No one is available at bedside  Disposition Plan: Skilled nursing facility when bed available.  Level II PASRR pending.  Medically stable for disposition  Consultants:  None  Procedures:  None  Antibiotics: Anti-infectives (From admission, onward)   Start     Dose/Rate Route Frequency Ordered Stop   08/17/18 1200  cefdinir (OMNICEF) capsule 300 mg     300 mg Oral Every 12 hours 08/17/18 1022 08/21/18 2359   08/15/18 1900  vancomycin (VANCOCIN) 1,250 mg in sodium chloride 0.9 % 250 mL IVPB  Status:  Discontinued     1,250 mg 166.7 mL/hr over 90 Minutes Intravenous Every 24 hours 08/14/18 2055 08/15/18 0921   08/15/18 0200  metroNIDAZOLE (FLAGYL) IVPB 500 mg  Status:  Discontinued     500 mg 100 mL/hr over 60 Minutes Intravenous Every 8 hours 08/14/18 2039 08/17/18 1022   08/15/18 0100  ceFEPIme (MAXIPIME) 1 g in sodium chloride 0.9 % 100 mL IVPB  Status:  Discontinued     1 g 200 mL/hr over 30 Minutes Intravenous Every 8 hours 08/14/18 2055 08/17/18 1022   08/14/18 1600  ceFEPIme (MAXIPIME) 2 g in sodium chloride 0.9 % 100 mL IVPB     2 g 200 mL/hr over 30 Minutes Intravenous  Once 08/14/18 1548 08/14/18 1752   08/14/18 1600  metroNIDAZOLE (FLAGYL) IVPB 500 mg     500 mg 100 mL/hr over 60 Minutes Intravenous  Once 08/14/18 1548 08/14/18 1853   08/14/18 1600  vancomycin (VANCOCIN) IVPB 1000 mg/200 mL premix  Status:  Discontinued     1,000 mg 200 mL/hr over 60 Minutes Intravenous  Once 08/14/18 1548 08/14/18 1553   08/14/18 1600  vancomycin (VANCOCIN) 1,750 mg in sodium chloride 0.9 % 500 mL IVPB     1,750 mg 250  mL/hr over 120 Minutes Intravenous  Once 08/14/18 1553 08/14/18 2154     Objective: Vitals:   08/20/18 2234 08/21/18 0556  BP: (!) 131/92 (!) 141/96  Pulse: 84 92  Resp: 18 18  Temp: 98.3 F (36.8 C) 98.2 F (36.8 C)  SpO2: 96% 97%    Intake/Output Summary (Last 24 hours) at 08/21/2018 1250 Last data filed at 08/21/2018 0557 Gross per 24 hour  Intake 600 ml  Output 900 ml  Net -300 ml   Filed Weights   08/14/18 1251 08/14/18 2248  Weight: 83.5 kg 83.4 kg   Body mass index is 31.55 kg/m.   Physical Exam: General:  Average built, not in obvious distress.  Mild expressive aphasia.  Obese.  Slow to respond. HENT: Normocephalic, pupils equally reacting to light and accommodation.  No scleral pallor or icterus noted. Oral mucosa is moist.  Chest:  Clear breath sounds.  Diminished breath sounds bilaterally. No crackles or wheezes.  CVS: S1 &S2 heard. No murmur.  Regular rate and rhythm. Abdomen: Soft, mild nonspecific tenderness noted over the abdomen.  Nondistended.  Bowel sounds are heard.  Liver is not palpable, no abdominal mass palpated Extremities: No cyanosis, clubbing or edema.  Peripheral pulses are palpable. Psych: Alert, awake and oriented, normal mood CNS:  No cranial nerve deficits.  Power equal in all extremities.  No sensory deficits noted.  No cerebellar signs.   Skin: Warm and dry.  No rashes noted.  Data Review: I have personally reviewed the following laboratory data and studies,  CBC: Recent Labs  Lab 08/17/18 0338 08/18/18 0334 08/19/18 0350 08/20/18 0433 08/21/18 0426  WBC 10.6* 8.5 9.8 9.6 9.8  HGB 11.1* 11.4* 12.0 11.2* 11.8*  HCT 36.3 38.0 38.1 37.4 40.0  MCV 93.8 94.3 92.3 94.2 94.6  PLT 250 274 299 329 932   Basic Metabolic Panel: Recent Labs  Lab 08/14/18 2100  08/17/18 0338 08/18/18 0334 08/19/18 0350 08/20/18 0433 08/21/18 0426  NA  --    < > 140 139 139 142 143  K  --    < > 3.3* 3.3* 3.4* 3.8 3.4*  CL  --    < > 108 103 103 108  108  CO2  --    < > 26 26 28 27 27   GLUCOSE  --    < > 96 85 91 98 131*  BUN  --    < > 6 <5* <5* <5* <5*  CREATININE  --    < > 0.57 0.49 0.50 0.94 0.85  CALCIUM  --    < > 7.8* 7.9* 7.9* 8.3* 8.7*  MG 1.6*  --  1.5* 1.8  --   --   --    < > = values in this interval not displayed.   Liver Function Tests: Recent Labs  Lab 08/14/18 1324 08/17/18 0338  AST 16 12*  ALT 9 7  ALKPHOS 76 50  BILITOT 1.6* 0.7  PROT 6.9 5.4*  ALBUMIN 3.6 2.6*   Recent  Labs  Lab 08/14/18 1324  LIPASE 30   No results for input(s): AMMONIA in the last 168 hours. Cardiac Enzymes: No results for input(s): CKTOTAL, CKMB, CKMBINDEX, TROPONINI in the last 168 hours. BNP (last 3 results) No results for input(s): BNP in the last 8760 hours.  ProBNP (last 3 results) No results for input(s): PROBNP in the last 8760 hours.  CBG: Recent Labs  Lab 08/20/18 0743 08/20/18 1159 08/20/18 1628 08/20/18 2241 08/21/18 1146  GLUCAP 100* 183* 210* 141* 189*   Recent Results (from the past 240 hour(s))  Blood Culture (routine x 2)     Status: None   Collection Time: 08/14/18  3:47 PM  Result Value Ref Range Status   Specimen Description   Final    BLOOD RIGHT ANTECUBITAL Performed at [facility] [address], [place], [place] [address]    Special Requests   Final    BOTTLES DRAWN AEROBIC AND ANAEROBIC Blood Culture adequate volume Performed at [facility] [address], [place], [place] [address]    Culture   Final    NO GROWTH 5 DAYS Performed at [facility], [place] [address], [place], [place] [address]    Report Status 08/19/2018 FINAL  Final  Blood Culture (routine x 2)     Status: None   Collection Time: 08/14/18  3:52 PM  Result Value Ref Range Status   Specimen Description   Final    BLOOD LEFT ANTECUBITAL Performed at [facility] [address], [place], [place] [address]    Special Requests   Final    BOTTLES DRAWN  AEROBIC AND ANAEROBIC Blood Culture adequate volume Performed at [facility] [address], [place], [place] [address]    Culture   Final    NO GROWTH 5 DAYS Performed at [facility], [place] [address] [place], [place] [address]    Report Status 08/19/2018 FINAL  Final  Respiratory Panel by PCR     Status: Abnormal   Collection Time: 08/14/18  6:46 PM  Result Value Ref Range Status   Adenovirus NOT DETECTED NOT DETECTED Final   Coronavirus 229E NOT DETECTED NOT DETECTED Final    Comment: (NOTE) The Coronavirus on the Respiratory Panel, DOES NOT test for the novel  Coronavirus (2019 nCoV)    Coronavirus HKU1 NOT DETECTED NOT DETECTED Final   Coronavirus NL63 NOT DETECTED NOT DETECTED Final   Coronavirus OC43 NOT DETECTED NOT DETECTED Final   Metapneumovirus NOT DETECTED NOT DETECTED Final   Rhinovirus / Enterovirus DETECTED (A) NOT DETECTED Final   Influenza A NOT DETECTED NOT DETECTED Final   Influenza B NOT DETECTED NOT DETECTED Final   Parainfluenza Virus 1 NOT DETECTED NOT DETECTED Final   Parainfluenza Virus 2 NOT DETECTED NOT DETECTED Final   Parainfluenza Virus 3 NOT DETECTED NOT DETECTED Final   Parainfluenza Virus 4 NOT DETECTED NOT DETECTED Final   Respiratory Syncytial Virus NOT DETECTED NOT DETECTED Final   Bordetella pertussis NOT DETECTED NOT DETECTED Final   Chlamydophila pneumoniae NOT DETECTED NOT DETECTED Final   Mycoplasma pneumoniae NOT DETECTED NOT DETECTED Final    Comment: Performed at [facility], [place]. [address] [place], [place] [address]     Studies: No results found.  Scheduled Meds: . atorvastatin  40 mg Oral q1800  . calcitRIOL  0.25 mcg Oral QODAY  . cefdinir  300 mg Oral Q12H  . diltiazem  240 mg  Oral Daily  . feeding supplement  1 Container Oral BID BM  . insulin aspart  0-9 Units Subcutaneous TID WC  . levothyroxine  150 mcg Oral Q0600  . losartan  100 mg Oral Daily  . mycophenolate  250 mg Oral BID  .  pantoprazole  40 mg Oral BID AC  . potassium chloride  40 mEq Oral BID  . predniSONE  5 mg Oral Q breakfast  . sucralfate  1 g Oral TID WC & HS  . tacrolimus  1 mg Oral QHS   And  . tacrolimus  2 mg Oral Daily  . warfarin  1 mg Oral ONCE-1800  . Warfarin - Pharmacist Dosing Inpatient   Does not apply q1800    Continuous Infusions:    Flora Lipps, MD  [facility] 08/21/2018

## 2018-08-21 NOTE — TOC Progression Note (Signed)
Transition of Care [facility]) - Progression Note    Patient Details  Name: Connie Ruiz MRN: [id number] Date of Birth: 1960/09/22  Transition of Care [facility]) CM/SW [place], LCSW Phone Number: 08/21/2018, 11:55 AM  Clinical Narrative:    Patient and daughter agreeable to [facility]. SNF started [employer] process.    Expected Discharge Plan: Skilled Nursing Facility  Barriers to Discharge: Other (comment)  Expected Discharge Plan and Services Expected Discharge Plan: [place]  Discharge Planning Services: CM Consult   Living arrangements for the past 2 months: Apartment Expected Discharge Date: (unknown)                         Social Determinants of Health (SDOH) Interventions    Readmission Risk Interventions 30 Day Unplanned Readmission Risk Score     ED to Hosp-Admission (Current) from 08/14/2018 in [place]  30 Day Unplanned Readmission Risk Score (%)  29 Filed at 08/21/2018 0801     This score is the patient's risk of an unplanned readmission within 30 days of being discharged (0 -100%). The score is based on dignosis, age, lab data, medications, orders, and past utilization.   Low:  0-14.9   Medium: 15-21.9   High: 22-29.9   Extreme: 30 and above       No flowsheet data found.

## 2018-08-21 NOTE — Progress Notes (Addendum)
[place] for Warfarin (on warfarin PTA) Indication: H/O PE and DVT  Allergies  Allergen Reactions  . Oxycodone-Acetaminophen Shortness Of Breath and Nausea Only  . Propoxyphene Nausea Only and Shortness Of Breath    States takes tylenol at home  . Propoxyphene N-Acetaminophen Shortness Of Breath and Nausea Only  . Sulfonamide Derivatives Shortness Of Breath and Nausea Only  . Codeine Nausea Only  . Hydrocodone-Acetaminophen   . Hydromorphone   . Other Other (See Comments)    No blood, Jehovaeh Witness   . Oxycodone   . Sulfamethoxazole   . Tape     Redness**PAPER TAPE OK**  . Gabapentin Anxiety    twitching  . Latex Rash  . Metoprolol Rash  . Morphine And Related Rash    IV site on arm is red, patient reports this is improving.  NO shortness of breath reported.  . Rosiglitazone Rash    Patient Measurements: Height: 5\' 4"  (162.6 cm) Weight: 183 lb 12.8 oz (83.4 kg) IBW/kg (Calculated) : 54.7   Vital Signs: Temp: 98.2 F (36.8 C) (03/16 0556) Temp Source: Oral (03/16 0556) BP: 141/96 (03/16 0556) Pulse Rate: 92 (03/16 0556)  Labs: Recent Labs    08/19/18 0350 08/20/18 0433 08/21/18 0426  HGB 12.0 11.2* 11.8*  HCT 38.1 37.4 40.0  PLT 299 329 336  LABPROT 24.8* 27.6* 31.3*  INR 2.3* 2.6* 3.1*  CREATININE 0.50 0.94 0.85    Estimated Creatinine Clearance: 76.3 mL/min (by C-G formula based on SCr of 0.85 mg/dL).   Medical History: Past Medical History:  Diagnosis Date  . Anxiety   . Candida esophagitis ([place]) 11/12/2014  . CEREBROVASCULAR ACCIDENT, ACUTE 04/15/2010  . CLOSTRIDIUM DIFFICILE COLITIS, HX OF 08/21/2007  . CONGESTIVE HEART FAILURE 08/21/2007  . Current use of long term anticoagulation    Dr. Andree Elk, [facility]  . CVA 04/17/2010  . Depression    Dr. Andree Elk, [place], TYPE II 08/21/2007  . DVT, HX OF 08/21/2007  . GERD 08/21/2007  . GOUT 08/21/2007  . History of stroke with residual  effects   . HYPERLIPIDEMIA 08/21/2007  . HYPERTENSION 08/21/2007   Dr. Andree Elk, [place], HX OF 08/22/2007   s/p renal transplant-Dr. Andree Elk, [place] 08/21/2007  . OSTEOPOROSIS 08/21/2007   [facility] at [facility]  . Pulmonary embolism ([place]) 07/16/2010  . Renal failure   . RENAL INSUFFICIENCY 08/21/2007  . Right sided weakness   . Steroid-induced hyperglycemia 11/09/2014  . Tachycardia   . THYROID NODULE, LEFT 04/10/2009    Medications:  PTA Warfarin regimen:  2.5mg  daily except 5mg  on Mon & Fri  Assessment: 58 y.o. female admitted on 08/14/2018 with sepsis.  PMH significant for lupus, kidney transplant, h/o PE & DVT. Pharmacy consulted to continue dosing warfarin on admission.  Today, 08/21/2018  INR today is slightly SUPRAtherapeutic and trending up over the last few days - has received 2 doses of 2.5mg  warfarin as inpatient  CBC: stable  Drug interactions: broad spectrum abx transitioned to cefdinir - may potentially increase INR BUT cefdinir completed on 3/15  CLD ordered-  50-100% meals charted   Significant events: 3/10: coffee ground emesis, gastric contents heme (+), warfarin dose held 3/11: discussed above events with Dr. Louanne Belton, ok to proceed with warfarin today since Hgb did not significantly drop; will not use lovenox bridging at this time 3/13 No further bleeding issues per rounds   Goal of Therapy:  INR 2-3  Plan:   Only 1mg  warfarin today due to rising INR  Check daily PT/INR  Monitor for signs and symptoms of bleeding  If patient discharged, would recommend rechecking INR tomorrow before resuming home dose of warfarin as above   Adrian Saran, PharmD, BCPS Pager [phone] 08/21/2018 10:03 AM

## 2018-08-22 DIAGNOSIS — I82401 Acute embolism and thrombosis of unspecified deep veins of right lower extremity: Secondary | ICD-10-CM | POA: Diagnosis not present

## 2018-08-22 DIAGNOSIS — Z7401 Bed confinement status: Secondary | ICD-10-CM | POA: Diagnosis not present

## 2018-08-22 DIAGNOSIS — I82409 Acute embolism and thrombosis of unspecified deep veins of unspecified lower extremity: Secondary | ICD-10-CM | POA: Diagnosis not present

## 2018-08-22 DIAGNOSIS — E876 Hypokalemia: Secondary | ICD-10-CM | POA: Diagnosis not present

## 2018-08-22 DIAGNOSIS — M2550 Pain in unspecified joint: Secondary | ICD-10-CM | POA: Diagnosis not present

## 2018-08-22 DIAGNOSIS — R2689 Other abnormalities of gait and mobility: Secondary | ICD-10-CM | POA: Diagnosis not present

## 2018-08-22 DIAGNOSIS — I110 Hypertensive heart disease with heart failure: Secondary | ICD-10-CM | POA: Diagnosis not present

## 2018-08-22 DIAGNOSIS — K219 Gastro-esophageal reflux disease without esophagitis: Secondary | ICD-10-CM | POA: Diagnosis not present

## 2018-08-22 DIAGNOSIS — I2699 Other pulmonary embolism without acute cor pulmonale: Secondary | ICD-10-CM | POA: Diagnosis not present

## 2018-08-22 DIAGNOSIS — E785 Hyperlipidemia, unspecified: Secondary | ICD-10-CM | POA: Diagnosis not present

## 2018-08-22 DIAGNOSIS — A419 Sepsis, unspecified organism: Secondary | ICD-10-CM | POA: Diagnosis not present

## 2018-08-22 DIAGNOSIS — E039 Hypothyroidism, unspecified: Secondary | ICD-10-CM | POA: Diagnosis not present

## 2018-08-22 DIAGNOSIS — B348 Other viral infections of unspecified site: Secondary | ICD-10-CM | POA: Diagnosis not present

## 2018-08-22 DIAGNOSIS — E870 Hyperosmolality and hypernatremia: Secondary | ICD-10-CM | POA: Diagnosis not present

## 2018-08-22 DIAGNOSIS — E119 Type 2 diabetes mellitus without complications: Secondary | ICD-10-CM | POA: Diagnosis not present

## 2018-08-22 DIAGNOSIS — R1031 Right lower quadrant pain: Secondary | ICD-10-CM | POA: Diagnosis not present

## 2018-08-22 DIAGNOSIS — M329 Systemic lupus erythematosus, unspecified: Secondary | ICD-10-CM | POA: Diagnosis not present

## 2018-08-22 DIAGNOSIS — R4701 Aphasia: Secondary | ICD-10-CM | POA: Diagnosis not present

## 2018-08-22 DIAGNOSIS — R110 Nausea: Secondary | ICD-10-CM | POA: Diagnosis not present

## 2018-08-22 DIAGNOSIS — D849 Immunodeficiency, unspecified: Secondary | ICD-10-CM | POA: Diagnosis not present

## 2018-08-22 DIAGNOSIS — G459 Transient cerebral ischemic attack, unspecified: Secondary | ICD-10-CM | POA: Diagnosis not present

## 2018-08-22 DIAGNOSIS — E1122 Type 2 diabetes mellitus with diabetic chronic kidney disease: Secondary | ICD-10-CM | POA: Diagnosis not present

## 2018-08-22 DIAGNOSIS — Z940 Kidney transplant status: Secondary | ICD-10-CM | POA: Diagnosis not present

## 2018-08-22 DIAGNOSIS — E639 Nutritional deficiency, unspecified: Secondary | ICD-10-CM | POA: Diagnosis not present

## 2018-08-22 DIAGNOSIS — D72829 Elevated white blood cell count, unspecified: Secondary | ICD-10-CM | POA: Diagnosis not present

## 2018-08-22 DIAGNOSIS — N3289 Other specified disorders of bladder: Secondary | ICD-10-CM | POA: Diagnosis not present

## 2018-08-22 DIAGNOSIS — M6281 Muscle weakness (generalized): Secondary | ICD-10-CM | POA: Diagnosis not present

## 2018-08-22 DIAGNOSIS — R197 Diarrhea, unspecified: Secondary | ICD-10-CM | POA: Diagnosis not present

## 2018-08-22 LAB — PROTIME-INR
INR: 3.3 — ABNORMAL HIGH (ref 0.8–1.2)
Prothrombin Time: 32.9 seconds — ABNORMAL HIGH (ref 11.4–15.2)

## 2018-08-22 LAB — GLUCOSE, CAPILLARY
Glucose-Capillary: 118 mg/dL — ABNORMAL HIGH (ref 70–99)
Glucose-Capillary: 190 mg/dL — ABNORMAL HIGH (ref 70–99)

## 2018-08-22 MED ORDER — ENSURE ENLIVE PO LIQD: 237.0000 mL | Freq: Two times a day (BID) | ORAL | Status: DC

## 2018-08-22 MED ORDER — DIPHENHYDRAMINE HCL 25 MG PO CAPS
25.0000 mg | ORAL_CAPSULE | Freq: Four times a day (QID) | ORAL | Status: DC | PRN
  Administered 2018-08-22: 25 mg via ORAL
  Filled 2018-08-22: qty 1

## 2018-08-22 MED ORDER — LEVOTHYROXINE SODIUM 150 MCG PO TABS: 137.0000 ug | ORAL_TABLET | Freq: Every day | ORAL | Status: DC

## 2018-08-22 MED ORDER — SUCRALFATE 1 G PO TABS: 1.0000 g | ORAL_TABLET | Freq: Four times a day (QID) | ORAL | Status: DC

## 2018-08-22 NOTE — Progress Notes (Signed)
Patient refused PO K+ this morning, when asked why she didn't want to take them pt stated " they are to big" Notified pt. I could break then in half or dissolve pt still refused.  Pt. Was educated that her K+ has decreased to 3.4 which is down from 3.8 the previous day. Patient was encouraged to eat foods and give a list of foods high in K+. MD is aware.

## 2018-08-22 NOTE — Progress Notes (Deleted)
[place] for Warfarin (on warfarin PTA) Indication: H/O PE and DVT  Allergies  Allergen Reactions  . Oxycodone-Acetaminophen Shortness Of Breath and Nausea Only  . Propoxyphene Nausea Only and Shortness Of Breath    States takes tylenol at home  . Propoxyphene N-Acetaminophen Shortness Of Breath and Nausea Only  . Sulfonamide Derivatives Shortness Of Breath and Nausea Only  . Codeine Nausea Only  . Hydrocodone-Acetaminophen   . Hydromorphone   . Other Other (See Comments)    No blood, Jehovaeh Witness   . Oxycodone   . Sulfamethoxazole   . Tape     Redness**PAPER TAPE OK**  . Gabapentin Anxiety    twitching  . Latex Rash  . Metoprolol Rash  . Morphine And Related Rash    IV site on arm is red, patient reports this is improving.  NO shortness of breath reported.  . Rosiglitazone Rash    Patient Measurements: Height: 5\' 4"  (162.6 cm) Weight: 183 lb 12.8 oz (83.4 kg) IBW/kg (Calculated) : 54.7   Vital Signs: Temp: 98.5 F (36.9 C) (03/17 0543) Temp Source: Oral (03/17 0543) BP: 144/78 (03/17 0543) Pulse Rate: 82 (03/17 0543)  Labs: Recent Labs    08/20/18 0433 08/21/18 0426 08/22/18 0354  HGB 11.2* 11.8*  --   HCT 37.4 40.0  --   PLT 329 336  --   LABPROT 27.6* 31.3* 32.9*  INR 2.6* 3.1* 3.3*  CREATININE 0.94 0.85  --     Estimated Creatinine Clearance: 76.3 mL/min (by C-G formula based on SCr of 0.85 mg/dL).   Medical History: Past Medical History:  Diagnosis Date  . Anxiety   . Candida esophagitis ([place]) 11/12/2014  . CEREBROVASCULAR ACCIDENT, ACUTE 04/15/2010  . CLOSTRIDIUM DIFFICILE COLITIS, HX OF 08/21/2007  . CONGESTIVE HEART FAILURE 08/21/2007  . Current use of long term anticoagulation    Dr. Andree Elk, [facility]  . CVA 04/17/2010  . Depression    Dr. Andree Elk, [place], TYPE II 08/21/2007  . DVT, HX OF 08/21/2007  . GERD 08/21/2007  . GOUT 08/21/2007  . History of stroke with residual  effects   . HYPERLIPIDEMIA 08/21/2007  . HYPERTENSION 08/21/2007   Dr. Andree Elk, [place], HX OF 08/22/2007   s/p renal transplant-Dr. Andree Elk, [place] 08/21/2007  . OSTEOPOROSIS 08/21/2007   [facility] at [facility]  . Pulmonary embolism ([place]) 07/16/2010  . Renal failure   . RENAL INSUFFICIENCY 08/21/2007  . Right sided weakness   . Steroid-induced hyperglycemia 11/09/2014  . Tachycardia   . THYROID NODULE, LEFT 04/10/2009    Medications:  PTA Warfarin regimen:  2.5mg  daily except 5mg  on Mon & Fri  Assessment: 58 y.o. female admitted on 08/14/2018 with sepsis.  PMH significant for lupus, kidney transplant, h/o PE & DVT. Pharmacy consulted to continue dosing warfarin on admission.  Today, 08/22/2018  INR today is still SUPRAtherapeutic and trending up over the last few days - has received warfarin doses of 2.5mg , 2.5mg  and 0mg  as inpatient  CBC: stable  Drug interactions: broad spectrum abx transitioned to cefdinir - may potentially increase INR and cefdinir completed on 3/16  CLD ordered-  50-100% meals charted   Significant events: 3/10: coffee ground emesis, gastric contents heme (+), warfarin dose held 3/11: discussed above events with Dr. Louanne Belton, ok to proceed with warfarin today since Hgb did not significantly drop; will not use lovenox bridging at this time 3/13 No further bleeding issues  per rounds   Goal of Therapy:  INR 2-3   Plan:   NO warfarin today  Check daily PT/INR  Monitor for signs and symptoms of bleeding  If patient discharged, would recommend rechecking INR tomorrow before resuming home dose of warfarin as above   Adrian Saran, PharmD, BCPS Pager [phone] 08/22/2018 7:08 AM

## 2018-08-22 NOTE — Progress Notes (Signed)
Called and gave report to Best boy at [facility]. IV remove WNL. Pt and Family aware of DC plans. Pt in NAD. [facility] here to transport. AVS and DC packet was sent with transport.

## 2018-08-22 NOTE — Progress Notes (Signed)
[place] for Warfarin (on warfarin PTA) Indication: H/O PE and DVT  Allergies  Allergen Reactions  . Oxycodone-Acetaminophen Shortness Of Breath and Nausea Only  . Propoxyphene Nausea Only and Shortness Of Breath    States takes tylenol at home  . Propoxyphene N-Acetaminophen Shortness Of Breath and Nausea Only  . Sulfonamide Derivatives Shortness Of Breath and Nausea Only  . Codeine Nausea Only  . Hydrocodone-Acetaminophen   . Hydromorphone   . Other Other (See Comments)    No blood, Jehovaeh Witness   . Oxycodone   . Sulfamethoxazole   . Tape     Redness**PAPER TAPE OK**  . Gabapentin Anxiety    twitching  . Latex Rash  . Metoprolol Rash  . Morphine And Related Rash    IV site on arm is red, patient reports this is improving.  NO shortness of breath reported.  . Rosiglitazone Rash    Patient Measurements: Height: 5\' 4"  (162.6 cm) Weight: 183 lb 12.8 oz (83.4 kg) IBW/kg (Calculated) : 54.7   Vital Signs: Temp: 98.5 F (36.9 C) (03/17 0543) Temp Source: Oral (03/17 0543) BP: 144/78 (03/17 0543) Pulse Rate: 82 (03/17 0543)  Labs: Recent Labs    08/20/18 0433 08/21/18 0426 08/22/18 0354  HGB 11.2* 11.8*  --   HCT 37.4 40.0  --   PLT 329 336  --   LABPROT 27.6* 31.3* 32.9*  INR 2.6* 3.1* 3.3*  CREATININE 0.94 0.85  --     Estimated Creatinine Clearance: 76.3 mL/min (by C-G formula based on SCr of 0.85 mg/dL).   Medical History: Past Medical History:  Diagnosis Date  . Anxiety   . Candida esophagitis ([place]) 11/12/2014  . CEREBROVASCULAR ACCIDENT, ACUTE 04/15/2010  . CLOSTRIDIUM DIFFICILE COLITIS, HX OF 08/21/2007  . CONGESTIVE HEART FAILURE 08/21/2007  . Current use of long term anticoagulation    Dr. Andree Elk, [facility]  . CVA 04/17/2010  . Depression    Dr. Andree Elk, [place], TYPE II 08/21/2007  . DVT, HX OF 08/21/2007  . GERD 08/21/2007  . GOUT 08/21/2007  . History of stroke with residual  effects   . HYPERLIPIDEMIA 08/21/2007  . HYPERTENSION 08/21/2007   Dr. Andree Elk, [place], HX OF 08/22/2007   s/p renal transplant-Dr. Andree Elk, [place] 08/21/2007  . OSTEOPOROSIS 08/21/2007   [facility]umatol at [facility]  . Pulmonary embolism ([place]) 07/16/2010  . Renal failure   . RENAL INSUFFICIENCY 08/21/2007  . Right sided weakness   . Steroid-induced hyperglycemia 11/09/2014  . Tachycardia   . THYROID NODULE, LEFT 04/10/2009    Medications:  PTA Warfarin regimen:  2.5mg  daily except 5mg  on Mon & Fri  Assessment: 58 y.o. female admitted on 08/14/2018 with sepsis.  PMH significant for lupus, kidney transplant, h/o PE & DVT. Pharmacy consulted to continue dosing warfarin on admission.  Today, 08/22/2018  INR today is still SUPRAtherapeutic and trending up over the last few days - has received warfarin doses of 2.5mg , 2.5mg  and 1mg  as inpatient  CBC: stable  Drug interactions: broad spectrum abx transitioned to cefdinir - may potentially increase INR and cefdinir completed on 3/16  CLD ordered-  50-100% meals charted   Significant events: 3/10: coffee ground emesis, gastric contents heme (+), warfarin dose held 3/11: discussed above events with Dr. Louanne Belton, ok to proceed with warfarin today since Hgb did not significantly drop; will not use lovenox bridging at this time 3/13 No further bleeding issues  per rounds   Goal of Therapy:  INR 2-3   Plan:   NO warfarin today  Check daily PT/INR  Monitor for signs and symptoms of bleeding  If patient discharged, would recommend rechecking INR tomorrow before resuming home dose of warfarin as above   Adrian Saran, PharmD, BCPS Pager [phone] 08/22/2018 7:10 AM

## 2018-08-22 NOTE — Progress Notes (Signed)
Nutrition Follow-up  DOCUMENTATION CODES:   Not applicable  INTERVENTION:   D/C Boost Breeze   Add Ensure Enlive po BID, each supplement provides 350 kcal and 20 grams of protein  NUTRITION DIAGNOSIS:   Inadequate oral intake related to vomiting, nausea as evidenced by per patient/family report.  Progressing  GOAL:   Patient will meet greater than or equal to 90% of their needs  Progressing  MONITOR:   PO intake, Supplement acceptance, Diet advancement, Labs  REASON FOR ASSESSMENT:   Malnutrition Screening Tool    ASSESSMENT:   Patient with PMH significant for CVA, CHF, DM, DVT, PE, HTN, HLD, lupus, and s/p kidney transplant on chronic immunosuppressive therapy. Presents this admission with sepsis suspected to be from upper respiratory illness and possible GI bleed.    Per pt appetite is progressing well. No nausea/vomiting. Meal completions charted as 50-100% for pt's last eight meals. Pt does not like Boost Breeze but is willing to try Ensure. RD to order.   A weight has not been obtained since 3/9.   Medications reviewed and include: calcitriol, SS novolog, 40 mEq KCl BID, prednisone Labs reviewed: K 3.4 (L) CBG 100-229  Diet Order:   Diet Order            DIET SOFT Room service appropriate? Yes; Fluid consistency: Thin  Diet effective now              EDUCATION NEEDS:   Education needs have been addressed  Skin:  Skin Assessment: Reviewed RN Assessment  Last BM:  3/16  Height:   Ht Readings from Last 1 Encounters:  08/14/18 5\' 4"  (1.626 m)    Weight:   Wt Readings from Last 1 Encounters:  08/14/18 83.4 kg    Ideal Body Weight:  54.5 kg  BMI:  Body mass index is 31.55 kg/m.  Estimated Nutritional Needs:   Kcal:  1650-1850 kcal  Protein:  80-95 grams  Fluid:  >/= 1.6 L/day    Mariana Single RD, LDN Clinical Nutrition Pager # - [phone]

## 2018-08-22 NOTE — TOC Transition Note (Addendum)
Transition of Care [facility]) - CM/SW Discharge Note   Patient Details  Name: Connie Ruiz MRN: [id number] Date of Birth: [birth year]  [facility] CM/SW Contact:  Lia Hopping, [place] Phone Number: 08/22/2018, 1:06 PM   Clinical Narrative:    Nurse call report to: [phone] Room 603 [facility] called to transport.   Final next level of care: Skilled Nursing Facility Barriers to Discharge: No Barriers Identified   Patient Goals and CMS Choice Patient states their goals for this hospitalization and ongoing recovery are:: to return home with spouse  CMS Medicare.gov Compare Post Acute Care list provided to:: Other (Comment Required)(Spouse) Choice offered to / list presented to : Patient  Discharge Placement PASRR number recieved: ([id number])            Patient chooses bed at: [facility] Patient to be transferred to facility by: [place]  Name of family member notified: Daughter-Shatera Patient and family notified of of transfer: 08/22/18  Discharge Plan and Services Discharge Planning Services: CM Consult                      Social Determinants of Health (SDOH) Interventions     Readmission Risk Interventions No flowsheet data found.

## 2018-08-22 NOTE — Discharge Summary (Signed)
Physician Discharge Summary  AHNA KONKLE YCX:[id number] DOB: 07-15-1960 DOA: 08/14/2018  PCP: Patient, No Pcp Per  Admit date: 08/14/2018 Discharge date: 08/22/2018  Admitted From: Home  Discharge disposition: SNF   Recommendations for Outpatient Follow-Up:   Follow up with your primary care provider skilled nursing facility in 3 to 5 days.  Follow-up with your nephrologist as scheduled by you.   Discharge Diagnosis:   Principal Problem:   Sepsis ([place]) Active Problems:   LUPUS   Pulmonary embolism ([facility])   Hypokalemia   Type 2 diabetes mellitus ([place])  Discharge Condition: Improved.  Diet recommendation: Carbohydrate-modified.  Wound care: None.  Code status: Full.   History of Present Illness:   58 year old female with history of prior CVA, diastolic CHF, type 2 diabetes mellitus, prior DVT and PE, hypertension, hyperlipidemia, lupus, kidney transplant on chronic immunosuppressive therapy,  presented to the hospital with flulike symptoms. She reported 3-day history of fevers, nonproductive cough, runny nose and sore throat. Patient also had been having generalized abdominal pain along with nausea, vomiting and diarrhea. No recent travel or sick contacts.   Hospital Course:  Patient was then admitted to hospital and following conditions were addressed during hospitalization,  Sepsis, present on admission likely secondary to respiratory tract infection.    Patient tested positive for rhinovirus infection.  Received IV cefepime, vancomycin and metronidazole empirically but was subsequently discontinued since the cultures were negative and there was no fever or subsequent leukocytosis.  Completed Po cefdinir course while in the hospital..   Blood cultures were negative. No leukocytosis.    Respiratory status has stabilized and patient is no more contagious..  Hypokalemia.  Continue to replenish orally.  Will need BMP check in 3 to 5 days.  Hypomagnesemia.  Improved with  replacement.  Abdominal pain -Intermittent abdominal pain for several years.  CT scan of the abdomen showed some thickening of the jejunal area.  Sucralfate was added on this admission.  Continue on Nexium from home.  Hypothyroidism TSH was elevated at 64. Patient was on Synthroid 137 mcg at home which has been increased to 150 mcg. Patient will need to check TSH in 3 to 4 weeks as outpatient.    Vomiting with Gastroccult blood positive.  One episode.  Self resolved.  Continue proton pump inhibitors.  There was no drop in her hemoglobin.  History of PE and DVT  -Patient is on Coumadin at home,  subtherapeutic on presentation.  Currently therapeutic.  DVT studies in the ER were negative given right lower extremity swelling. Continue Coumadin with PT/INR monitoring after discharge.  History of lupus, renal transplant on chronic immunosuppressive therapy Continue CellCept and tacrolimus prednisone.    She was advised to follow-up with her nephrologist as outpatient.  Type 2 diabetes mellitus -Metformin on discharge.  Glycemic control was adequate during hospitalization.  Disposition:  At this time, patient is stable for disposition to skilled nursing facility.  I spoke with the patient's daughter on the phone and updated her about the clinical condition of the patient and the plan for disposition.  Medical Consultants:    None.  Subjective:   Today, patient feels okay.  Complains of mild abdominal pain.  No nausea vomiting.  Has had bowel movements.  No fever, cough shortness of breath chest pain.  Discharge Exam:   Vitals:   08/21/18 2132 08/22/18 0543  BP: (!) 163/95 (!) 144/78  Pulse: 89 82  Resp: 12 12  Temp: 98.9 F (37.2 C) 98.5 F (36.9  C)  SpO2: 100% 99%   Vitals:   08/21/18 0556 08/21/18 1422 08/21/18 2132 08/22/18 0543  BP: (!) 141/96 133/83 (!) 163/95 (!) 144/78  Pulse: 92 91 89 82  Resp: '18 18 12 12  ' Temp: 98.2 F (36.8 C) 98.8 F (37.1 C) 98.9 F  (37.2 C) 98.5 F (36.9 C)  TempSrc: Oral Oral Oral Oral  SpO2: 97% 96% 100% 99%  Weight:      Height:        General exam: Appears calm and comfortable ,Not in distress, wheeze.  Mild expressive aphasia. HEENT:PERRL,Oral mucosa moist Respiratory system: Bilateral equal air entry, normal vesicular breath sounds, no wheezes or crackles  Cardiovascular system: S1 & S2 heard, RRR.  Gastrointestinal system: Abdomen is nondistended, soft but nonspecific tenderness noted.  No organomegaly or masses felt. Normal bowel sounds heard. Central nervous system: Alert and oriented. No focal neurological deficits. Extremities: No edema, no clubbing ,no cyanosis, distal peripheral pulses palpable. Skin: No rashes, lesions or ulcers,no icterus ,no pallor MSK: Normal muscle bulk,tone ,power    Procedures:    None  The results of significant diagnostics from this hospitalization (including imaging, microbiology, ancillary and laboratory) are listed below for reference.     Diagnostic Studies:   Dg Chest 2 View  Result Date: 08/14/2018 CLINICAL DATA:  Nonproductive cough. EXAM: CHEST - 2 VIEW COMPARISON:  July 16, 2017 FINDINGS: The heart size and mediastinal contours are within normal limits. Both lungs are clear. The visualized skeletal structures are unremarkable. IMPRESSION: No active cardiopulmonary disease. Electronically Signed   By: Dorise Bullion III M.D   On: 08/14/2018 18:31   Ct Abdomen Pelvis W Contrast  Result Date: 08/15/2018 CLINICAL DATA:  Respiratory illness with nausea and vomiting EXAM: CT ABDOMEN AND PELVIS WITH CONTRAST TECHNIQUE: Multidetector CT imaging of the abdomen and pelvis was performed using the standard protocol following bolus administration of intravenous contrast. CONTRAST:  48m OMNIPAQUE IOHEXOL 300 MG/ML SOLN, 340mOMNIPAQUE IOHEXOL 300 MG/ML SOLN COMPARISON:  CTA abdomen pelvis 06/15/2015 FINDINGS: LOWER CHEST: Right basilar consolidation or atelectasis.  HEPATOBILIARY: The hepatic contours and density are normal. There is no intra- or extrahepatic biliary dilatation. The gallbladder is normal. PANCREAS: The pancreatic parenchymal contours are normal and there is no ductal dilatation. There is no peripancreatic fluid collection. SPLEEN: Normal. ADRENALS/URINARY TRACT: --Adrenal glands: Normal. --Right kidney/ureter: Severely atrophic right kidney --Left kidney/ureter: Severely atrophic left kidney --right lower quadrant renal transplant: Normal appearance without hydronephrosis. --Urinary bladder: Normal for degree of distention STOMACH/BOWEL: --Stomach/Duodenum: There is no hiatal hernia or other gastric abnormality. The duodenal course and caliber are normal. --Small bowel: There is moderate wall thickening of the proximal jejunum. --Colon: No focal abnormality. --Appendix: Normal. VASCULAR/LYMPHATIC: There is aortic atherosclerosis without hemodynamically significant stenosis. The portal vein, splenic vein, superior mesenteric vein and IVC are patent. No abdominal or pelvic lymphadenopathy. REPRODUCTIVE: Normal uterus. No adnexal mass. MUSCULOSKELETAL. No bony spinal canal stenosis or focal osseous abnormality. OTHER: None. IMPRESSION: 1. Moderate wall thickening of the proximal jejunum, which may indicate acute enteritis. 2. Right basilar consolidation or atelectasis. 3.  Aortic atherosclerosis (ICD10-I70.0). Electronically Signed   By: KeUlyses Jarred.D.   On: 08/15/2018 16:14   Vas [place] Extremity Venous (dvt) (only [facility])  Result Date: 08/14/2018  Lower Venous Study Indications: Swelling.  Limitations: Poor ultrasound/tissue interface. Performing Technologist: GrOliver HumVT  Examination Guidelines: A complete evaluation includes B-mode imaging, spectral Doppler, color Doppler, and power Doppler as needed of  all accessible portions of each vessel. Bilateral testing is considered an integral part of a complete examination. Limited examinations for  reoccurring indications may be performed as noted.  Right Venous Findings: +---------+---------------+---------+-----------+----------+-------+          CompressibilityPhasicitySpontaneityPropertiesSummary +---------+---------------+---------+-----------+----------+-------+ CFV      Full           Yes      Yes                          +---------+---------------+---------+-----------+----------+-------+ SFJ      Full                                                 +---------+---------------+---------+-----------+----------+-------+ FV Prox  Full                                                 +---------+---------------+---------+-----------+----------+-------+ FV Mid                  Yes      Yes                          +---------+---------------+---------+-----------+----------+-------+ FV DistalFull                                                 +---------+---------------+---------+-----------+----------+-------+ PFV      Full                                                 +---------+---------------+---------+-----------+----------+-------+ POP      Full           Yes      Yes                          +---------+---------------+---------+-----------+----------+-------+ PTV      Full                                                 +---------+---------------+---------+-----------+----------+-------+ PERO     Full                                                 +---------+---------------+---------+-----------+----------+-------+  Left Venous Findings: +---+---------------+---------+-----------+----------+-------+    CompressibilityPhasicitySpontaneityPropertiesSummary +---+---------------+---------+-----------+----------+-------+ CFVFull           Yes      Yes                          +---+---------------+---------+-----------+----------+-------+    Summary: Right: There is no evidence of deep vein thrombosis in the lower extremity.  However, portions of this examination  were limited- see technologist comments above. No cystic structure found in the popliteal fossa. Left: No evidence of common femoral vein obstruction.  *See table(s) above for measurements and observations. Electronically signed by Ruta Hinds MD on 08/14/2018 at 4:59:14 PM.    Final     Labs:   Basic Metabolic Panel: Recent Labs  Lab 08/17/18 0338 08/18/18 0334 08/19/18 0350 08/20/18 0433 08/21/18 0426  NA 140 139 139 142 143  K 3.3* 3.3* 3.4* 3.8 3.4*  CL 108 103 103 108 108  CO2 '26 26 28 27 27  ' GLUCOSE 96 85 91 98 131*  BUN 6 <5* <5* <5* <5*  CREATININE 0.57 0.49 0.50 0.94 0.85  CALCIUM 7.8* 7.9* 7.9* 8.3* 8.7*  MG 1.5* 1.8  --   --   --    GFR Estimated Creatinine Clearance: 76.3 mL/min (by C-G formula based on SCr of 0.85 mg/dL). Liver Function Tests: Recent Labs  Lab 08/17/18 0338  AST 12*  ALT 7  ALKPHOS 50  BILITOT 0.7  PROT 5.4*  ALBUMIN 2.6*   No results for input(s): LIPASE, AMYLASE in the last 168 hours. No results for input(s): AMMONIA in the last 168 hours. Coagulation profile Recent Labs  Lab 08/18/18 0334 08/19/18 0350 08/20/18 0433 08/21/18 0426 08/22/18 0354  INR 1.9* 2.3* 2.6* 3.1* 3.3*    CBC: Recent Labs  Lab 08/17/18 0338 08/18/18 0334 08/19/18 0350 08/20/18 0433 08/21/18 0426  WBC 10.6* 8.5 9.8 9.6 9.8  HGB 11.1* 11.4* 12.0 11.2* 11.8*  HCT 36.3 38.0 38.1 37.4 40.0  MCV 93.8 94.3 92.3 94.2 94.6  PLT 250 274 299 329 336   Cardiac Enzymes: No results for input(s): CKTOTAL, CKMB, CKMBINDEX, TROPONINI in the last 168 hours. BNP: Invalid input(s): POCBNP CBG: Recent Labs  Lab 08/21/18 1146 08/21/18 1717 08/21/18 2129 08/22/18 0737 08/22/18 1202  GLUCAP 189* 178* 229* 118* 190*   D-Dimer No results for input(s): DDIMER in the last 72 hours. Hgb A1c No results for input(s): HGBA1C in the last 72 hours. Lipid Profile No results for input(s): CHOL, HDL, LDLCALC, TRIG, CHOLHDL,  LDLDIRECT in the last 72 hours. Thyroid function studies No results for input(s): TSH, T4TOTAL, T3FREE, THYROIDAB in the last 72 hours.  Invalid input(s): FREET3 Anemia work up No results for input(s): VITAMINB12, FOLATE, FERRITIN, TIBC, IRON, RETICCTPCT in the last 72 hours. Microbiology Recent Results (from the past 240 hour(s))  Blood Culture (routine x 2)     Status: None   Collection Time: 08/14/18  3:47 PM  Result Value Ref Range Status   Specimen Description   Final    BLOOD RIGHT ANTECUBITAL Performed at [facility] [address] [place], [place] [address]    Special Requests   Final    BOTTLES DRAWN AEROBIC AND ANAEROBIC Blood Culture adequate volume Performed at [facility] [address], [place], [place] [address]    Culture   Final    NO GROWTH 5 DAYS Performed at [facility], [place] [address], [place], [place] [address]    Report Status 08/19/2018 FINAL  Final  Blood Culture (routine x 2)     Status: None   Collection Time: 08/14/18  3:52 PM  Result Value Ref Range Status   Specimen Description   Final    BLOOD LEFT ANTECUBITAL Performed at [facility] [address], [place], [place] [address]    Special Requests   Final    BOTTLES DRAWN AEROBIC  AND ANAEROBIC Blood Culture adequate volume Performed at [facility] [address], [place], [place] [address]    Culture   Final    NO GROWTH 5 DAYS Performed at [facility], [place] [address] [place], [place] [address]    Report Status 08/19/2018 FINAL  Final  Respiratory Panel by PCR     Status: Abnormal   Collection Time: 08/14/18  6:46 PM  Result Value Ref Range Status   Adenovirus NOT DETECTED NOT DETECTED Final   Coronavirus 229E NOT DETECTED NOT DETECTED Final    Comment: (NOTE) The Coronavirus on the Respiratory Panel, DOES NOT test for the novel  Coronavirus (2019 nCoV)    Coronavirus HKU1 NOT DETECTED NOT  DETECTED Final   Coronavirus NL63 NOT DETECTED NOT DETECTED Final   Coronavirus OC43 NOT DETECTED NOT DETECTED Final   Metapneumovirus NOT DETECTED NOT DETECTED Final   Rhinovirus / Enterovirus DETECTED (A) NOT DETECTED Final   Influenza A NOT DETECTED NOT DETECTED Final   Influenza B NOT DETECTED NOT DETECTED Final   Parainfluenza Virus 1 NOT DETECTED NOT DETECTED Final   Parainfluenza Virus 2 NOT DETECTED NOT DETECTED Final   Parainfluenza Virus 3 NOT DETECTED NOT DETECTED Final   Parainfluenza Virus 4 NOT DETECTED NOT DETECTED Final   Respiratory Syncytial Virus NOT DETECTED NOT DETECTED Final   Bordetella pertussis NOT DETECTED NOT DETECTED Final   Chlamydophila pneumoniae NOT DETECTED NOT DETECTED Final   Mycoplasma pneumoniae NOT DETECTED NOT DETECTED Final    Comment: Performed at [facility], [place]. [address] [place], [place] [address]     Discharge Instructions:   Discharge Instructions    Diet Carb Modified   Complete by:  As directed    Discharge instructions   Complete by:  As directed    Follow-up with primary care physician at the skilled nursing facility in 3 to 5 days.  Check CBC., BMP in 3 to 5 days.  Follow-up with your nephrologist as scheduled by you.   Increase activity slowly   Complete by:  As directed      Allergies as of 08/22/2018      Reactions   Oxycodone-acetaminophen Shortness Of Breath, Nausea Only   Propoxyphene Nausea Only, Shortness Of Breath   States takes tylenol at home   Propoxyphene N-acetaminophen Shortness Of Breath, Nausea Only   Sulfonamide Derivatives Shortness Of Breath, Nausea Only   Codeine Nausea Only   Hydrocodone-acetaminophen    Hydromorphone    Other Other (See Comments)   No blood, Jehovaeh Witness    Oxycodone    Sulfamethoxazole    Tape    Redness**PAPER TAPE OK**   Gabapentin Anxiety   twitching   Latex Rash   Metoprolol Rash   Morphine And Related Rash   IV site on arm is red, patient reports this is  improving.  NO shortness of breath reported.   Rosiglitazone Rash      Medication List    TAKE these medications   acetaminophen 500 MG tablet Commonly known as:  TYLENOL Take 1,000 mg by mouth daily as needed for mild pain.   alendronate 70 MG tablet Commonly known as:  FOSAMAX TAKE 1 TABLET BY MOUTH ONCE A WEEK ON AN EMPTY STOMACH ON SATURDAYS WITH A FULL GLASS OF WATER. What changed:  See the new instructions.   atorvastatin 40 MG tablet Commonly known as:  LIPITOR Take 40 mg by mouth daily at 6 PM.   calcitRIOL  0.25 MCG capsule Commonly known as:  ROCALTROL TAKE 1 CAPSULE(0.25 MCG) BY MOUTH DAILY What changed:  See the new instructions.   diltiazem 240 MG 24 hr capsule Commonly known as:  TIAZAC Take 1 capsule (240 mg total) by mouth daily.   donepezil 10 MG tablet Commonly known as:  ARICEPT Take 10 mg by mouth at bedtime.   esomeprazole 20 MG capsule Commonly known as:  NEXIUM TAKE 1 CAPSULE(20 MG) BY MOUTH DAILY What changed:  See the new instructions.   feeding supplement (ENSURE ENLIVE) Liqd Take 237 mLs by mouth 2 (two) times daily between meals.   glucose blood test strip Commonly known as:  ONE TOUCH ULTRA TEST Use to check blood sugar 1 time per day   levothyroxine 137 MCG tablet Commonly known as:  SYNTHROID, LEVOTHROID Take 1 tablet (137 mcg total) by mouth daily before breakfast.   levothyroxine 125 MCG tablet Commonly known as:  SYNTHROID, LEVOTHROID TAKE 1 TABLET BY MOUTH DAILY BEFORE BREAKFAST   losartan 100 MG tablet Commonly known as:  COZAAR TAKE 1 TABLET(100 MG) BY MOUTH DAILY What changed:  See the new instructions.   magnesium oxide 400 MG tablet Commonly known as:  MAG-OX Take 800 mg by mouth daily.   metFORMIN 500 MG 24 hr tablet Commonly known as:  GLUCOPHAGE-XR TAKE 1 TABLET(500 MG) BY MOUTH DAILY WITH BREAKFAST What changed:  See the new instructions.   mirtazapine 30 MG tablet Commonly known as:  REMERON Take 1 tablet  (30 mg total) by mouth at bedtime.   mycophenolate 250 MG capsule Commonly known as:  CELLCEPT TAKE ONE CAPSULE BY MOUTH TWICE DAILY   nystatin powder Commonly known as:  MYCOSTATIN/NYSTOP Apply 1 g topically 4 (four) times daily as needed. For yeast under breast   ondansetron 4 MG disintegrating tablet Commonly known as:  Zofran ODT 35m ODT q4 hours prn nausea/vomiting   OneTouch Delica Lancets 303TMisc Use to check blood sugar 1 time per day   OneTouch Verio IQ System w/Device Kit Use to check blood sugar 1 time per day   oxybutynin 5 MG tablet Commonly known as:  DITROPAN Take 5 mg by mouth daily as needed for bladder spasms.   Potassium Chloride ER 20 MEQ Tbcr Take 20 mEq by mouth daily.   predniSONE 5 MG tablet Commonly known as:  DELTASONE Take 5 mg by mouth daily with breakfast.   QUEtiapine 25 MG tablet Commonly known as:  SEROquel Take 1 tablet (25 mg total) by mouth at bedtime.   sertraline 100 MG tablet Commonly known as:  ZOLOFT Take 0.5 tablets (50 mg total) by mouth daily.   sucralfate 1 g tablet Commonly known as:  Carafate Take 1 tablet (1 g total) by mouth 4 (four) times daily for 30 days.   tacrolimus 1 MG capsule Commonly known as:  PROGRAF Take 1-2 mg by mouth See admin instructions. 2 mg in the am and 1 mg at night   traMADol 50 MG tablet Commonly known as:  ULTRAM Take 1 tablet (50 mg total) by mouth every 8 (eight) hours as needed.   warfarin 5 MG tablet Commonly known as:  COUMADIN Take as directed. If you are unsure how to take this medication, talk to your nurse or doctor. Original instructions:  TAKE 1/2 TO 1 TABLET BY MOUTH DAILY AS DIRECTED BY COUMADIN CLINIC What changed:  See the new instructions.       Time coordinating discharge: 39 minutes  Signed:  Zacaria Pousson  [facility] 08/22/2018, 1:02 PM

## 2018-08-23 DIAGNOSIS — M329 Systemic lupus erythematosus, unspecified: Secondary | ICD-10-CM | POA: Diagnosis not present

## 2018-08-23 DIAGNOSIS — R197 Diarrhea, unspecified: Secondary | ICD-10-CM | POA: Diagnosis not present

## 2018-08-23 DIAGNOSIS — I82409 Acute embolism and thrombosis of unspecified deep veins of unspecified lower extremity: Secondary | ICD-10-CM | POA: Diagnosis not present

## 2018-08-23 DIAGNOSIS — A419 Sepsis, unspecified organism: Secondary | ICD-10-CM | POA: Diagnosis not present

## 2018-08-25 DIAGNOSIS — R1031 Right lower quadrant pain: Secondary | ICD-10-CM | POA: Diagnosis not present

## 2018-08-25 DIAGNOSIS — I110 Hypertensive heart disease with heart failure: Secondary | ICD-10-CM | POA: Diagnosis not present

## 2018-08-25 DIAGNOSIS — R197 Diarrhea, unspecified: Secondary | ICD-10-CM | POA: Diagnosis not present

## 2018-08-25 DIAGNOSIS — E119 Type 2 diabetes mellitus without complications: Secondary | ICD-10-CM | POA: Diagnosis not present

## 2018-08-30 ENCOUNTER — Ambulatory Visit: Payer: Medicare Other | Admitting: Podiatry

## 2018-09-11 DIAGNOSIS — D72829 Elevated white blood cell count, unspecified: Secondary | ICD-10-CM | POA: Diagnosis not present

## 2018-09-11 DIAGNOSIS — A419 Sepsis, unspecified organism: Secondary | ICD-10-CM | POA: Diagnosis not present

## 2018-09-11 DIAGNOSIS — E870 Hyperosmolality and hypernatremia: Secondary | ICD-10-CM | POA: Diagnosis not present

## 2018-09-11 DIAGNOSIS — E039 Hypothyroidism, unspecified: Secondary | ICD-10-CM | POA: Diagnosis not present

## 2018-09-19 ENCOUNTER — Other Ambulatory Visit: Payer: Self-pay

## 2018-09-19 ENCOUNTER — Encounter: Payer: Medicare Other | Admitting: Family Medicine

## 2018-09-19 DIAGNOSIS — D649 Anemia, unspecified: Secondary | ICD-10-CM | POA: Diagnosis not present

## 2018-09-19 DIAGNOSIS — D72829 Elevated white blood cell count, unspecified: Secondary | ICD-10-CM | POA: Diagnosis not present

## 2018-09-19 DIAGNOSIS — R05 Cough: Secondary | ICD-10-CM | POA: Diagnosis not present

## 2018-09-19 DIAGNOSIS — I110 Hypertensive heart disease with heart failure: Secondary | ICD-10-CM | POA: Diagnosis not present

## 2018-09-20 DIAGNOSIS — E119 Type 2 diabetes mellitus without complications: Secondary | ICD-10-CM | POA: Diagnosis not present

## 2018-09-20 DIAGNOSIS — E876 Hypokalemia: Secondary | ICD-10-CM | POA: Diagnosis not present

## 2018-09-20 DIAGNOSIS — E039 Hypothyroidism, unspecified: Secondary | ICD-10-CM | POA: Diagnosis not present

## 2018-09-20 DIAGNOSIS — K529 Noninfective gastroenteritis and colitis, unspecified: Secondary | ICD-10-CM | POA: Diagnosis not present

## 2018-09-26 ENCOUNTER — Ambulatory Visit: Payer: Medicare Other | Admitting: Family Medicine

## 2018-09-26 ENCOUNTER — Other Ambulatory Visit: Payer: Self-pay

## 2018-09-29 ENCOUNTER — Telehealth: Payer: Self-pay | Admitting: Family Medicine

## 2018-09-29 DIAGNOSIS — G8929 Other chronic pain: Secondary | ICD-10-CM | POA: Diagnosis not present

## 2018-09-29 DIAGNOSIS — R1031 Right lower quadrant pain: Secondary | ICD-10-CM | POA: Diagnosis not present

## 2018-09-29 DIAGNOSIS — I700 Atherosclerosis of aorta: Secondary | ICD-10-CM | POA: Diagnosis not present

## 2018-09-29 DIAGNOSIS — M329 Systemic lupus erythematosus, unspecified: Secondary | ICD-10-CM | POA: Diagnosis not present

## 2018-09-29 DIAGNOSIS — E785 Hyperlipidemia, unspecified: Secondary | ICD-10-CM | POA: Diagnosis not present

## 2018-09-29 DIAGNOSIS — N189 Chronic kidney disease, unspecified: Secondary | ICD-10-CM | POA: Diagnosis not present

## 2018-09-29 DIAGNOSIS — M6281 Muscle weakness (generalized): Secondary | ICD-10-CM | POA: Diagnosis not present

## 2018-09-29 DIAGNOSIS — M109 Gout, unspecified: Secondary | ICD-10-CM | POA: Diagnosis not present

## 2018-09-29 DIAGNOSIS — Z7901 Long term (current) use of anticoagulants: Secondary | ICD-10-CM | POA: Diagnosis not present

## 2018-09-29 DIAGNOSIS — R4701 Aphasia: Secondary | ICD-10-CM | POA: Diagnosis not present

## 2018-09-29 DIAGNOSIS — E1122 Type 2 diabetes mellitus with diabetic chronic kidney disease: Secondary | ICD-10-CM | POA: Diagnosis not present

## 2018-09-29 DIAGNOSIS — R2689 Other abnormalities of gait and mobility: Secondary | ICD-10-CM | POA: Diagnosis not present

## 2018-09-29 DIAGNOSIS — E039 Hypothyroidism, unspecified: Secondary | ICD-10-CM | POA: Diagnosis not present

## 2018-09-29 DIAGNOSIS — I5030 Unspecified diastolic (congestive) heart failure: Secondary | ICD-10-CM | POA: Diagnosis not present

## 2018-09-29 DIAGNOSIS — D631 Anemia in chronic kidney disease: Secondary | ICD-10-CM | POA: Diagnosis not present

## 2018-09-29 DIAGNOSIS — I130 Hypertensive heart and chronic kidney disease with heart failure and stage 1 through stage 4 chronic kidney disease, or unspecified chronic kidney disease: Secondary | ICD-10-CM | POA: Diagnosis not present

## 2018-09-29 DIAGNOSIS — K219 Gastro-esophageal reflux disease without esophagitis: Secondary | ICD-10-CM | POA: Diagnosis not present

## 2018-09-29 DIAGNOSIS — Z940 Kidney transplant status: Secondary | ICD-10-CM | POA: Diagnosis not present

## 2018-09-29 DIAGNOSIS — D849 Immunodeficiency, unspecified: Secondary | ICD-10-CM | POA: Diagnosis not present

## 2018-09-29 DIAGNOSIS — M810 Age-related osteoporosis without current pathological fracture: Secondary | ICD-10-CM | POA: Diagnosis not present

## 2018-09-29 NOTE — Telephone Encounter (Signed)
Left detailed message with verbal orders for [facility] as requested.

## 2018-09-29 NOTE — Telephone Encounter (Signed)
It is okay to give verbal authorization for requested orders. Thanks, Connie Ruiz

## 2018-09-29 NOTE — Telephone Encounter (Signed)
Copied from [place] [phone]. Topic: General - Other >> Sep 29, 2018  1:05 PM Keene Breath wrote: Reason for CRM: Ronny Bacon with [facility] called to get verbal orders for Skilled nursing eval for the next 8 weeks - 1 wk 1, 2 wks 3, 1 wk 3 for Hypertension, diabetes mellitus and medication management education.  If there are any questions, please call at [phone].

## 2018-09-29 NOTE — Telephone Encounter (Signed)
Messages sent to Dr. [name] for review and approval.

## 2018-10-02 ENCOUNTER — Telehealth: Payer: Self-pay | Admitting: Family Medicine

## 2018-10-02 DIAGNOSIS — D849 Immunodeficiency, unspecified: Secondary | ICD-10-CM | POA: Diagnosis not present

## 2018-10-02 DIAGNOSIS — G8929 Other chronic pain: Secondary | ICD-10-CM | POA: Diagnosis not present

## 2018-10-02 DIAGNOSIS — N189 Chronic kidney disease, unspecified: Secondary | ICD-10-CM | POA: Diagnosis not present

## 2018-10-02 DIAGNOSIS — R2689 Other abnormalities of gait and mobility: Secondary | ICD-10-CM | POA: Diagnosis not present

## 2018-10-02 DIAGNOSIS — E785 Hyperlipidemia, unspecified: Secondary | ICD-10-CM | POA: Diagnosis not present

## 2018-10-02 DIAGNOSIS — M810 Age-related osteoporosis without current pathological fracture: Secondary | ICD-10-CM | POA: Diagnosis not present

## 2018-10-02 DIAGNOSIS — D631 Anemia in chronic kidney disease: Secondary | ICD-10-CM | POA: Diagnosis not present

## 2018-10-02 DIAGNOSIS — I130 Hypertensive heart and chronic kidney disease with heart failure and stage 1 through stage 4 chronic kidney disease, or unspecified chronic kidney disease: Secondary | ICD-10-CM | POA: Diagnosis not present

## 2018-10-02 DIAGNOSIS — M329 Systemic lupus erythematosus, unspecified: Secondary | ICD-10-CM | POA: Diagnosis not present

## 2018-10-02 DIAGNOSIS — M6281 Muscle weakness (generalized): Secondary | ICD-10-CM | POA: Diagnosis not present

## 2018-10-02 DIAGNOSIS — E039 Hypothyroidism, unspecified: Secondary | ICD-10-CM | POA: Diagnosis not present

## 2018-10-02 DIAGNOSIS — R4701 Aphasia: Secondary | ICD-10-CM | POA: Diagnosis not present

## 2018-10-02 DIAGNOSIS — M109 Gout, unspecified: Secondary | ICD-10-CM | POA: Diagnosis not present

## 2018-10-02 DIAGNOSIS — I5030 Unspecified diastolic (congestive) heart failure: Secondary | ICD-10-CM | POA: Diagnosis not present

## 2018-10-02 DIAGNOSIS — E1122 Type 2 diabetes mellitus with diabetic chronic kidney disease: Secondary | ICD-10-CM | POA: Diagnosis not present

## 2018-10-02 DIAGNOSIS — K219 Gastro-esophageal reflux disease without esophagitis: Secondary | ICD-10-CM | POA: Diagnosis not present

## 2018-10-02 DIAGNOSIS — R1031 Right lower quadrant pain: Secondary | ICD-10-CM | POA: Diagnosis not present

## 2018-10-02 DIAGNOSIS — I700 Atherosclerosis of aorta: Secondary | ICD-10-CM | POA: Diagnosis not present

## 2018-10-02 DIAGNOSIS — Z7901 Long term (current) use of anticoagulants: Secondary | ICD-10-CM | POA: Diagnosis not present

## 2018-10-02 DIAGNOSIS — Z940 Kidney transplant status: Secondary | ICD-10-CM | POA: Diagnosis not present

## 2018-10-02 NOTE — Telephone Encounter (Signed)
Copied from [place] [id number]. Topic: Quick Communication - Home Health Verbal Orders >> Oct 02, 2018 12:31 PM Sheran Luz wrote: [facility] Callback Number: [phone] OK Requesting OT Frequency: 1w4

## 2018-10-02 NOTE — Telephone Encounter (Signed)
Verbal orders given to [facility].

## 2018-10-02 NOTE — Telephone Encounter (Signed)
Left detailed message giving verbal orders for PT to [facility].

## 2018-10-02 NOTE — Telephone Encounter (Signed)
Copied from [place] [phone]. Topic: Quick Communication - Home Health Verbal Orders >> Oct 02, 2018  2:01 PM Rutherford Nail, [place] wrote: Caller/Agency: [place] Number: [phone] (Can leave a voicemail) Requesting OT/PT/Skilled Nursing/Social Work/Speech Therapy: Physical Therapy Frequency:  1x a week for 3 weeks 1x every other week for 4 weeks

## 2018-10-02 NOTE — Telephone Encounter (Signed)
Spoke with Clair Gulling, gave verbal orders as requested for OT.

## 2018-10-03 ENCOUNTER — Other Ambulatory Visit: Payer: Self-pay | Admitting: *Deleted

## 2018-10-03 ENCOUNTER — Telehealth: Payer: Self-pay | Admitting: *Deleted

## 2018-10-03 DIAGNOSIS — D631 Anemia in chronic kidney disease: Secondary | ICD-10-CM | POA: Diagnosis not present

## 2018-10-03 DIAGNOSIS — E785 Hyperlipidemia, unspecified: Secondary | ICD-10-CM | POA: Diagnosis not present

## 2018-10-03 DIAGNOSIS — R1031 Right lower quadrant pain: Secondary | ICD-10-CM | POA: Diagnosis not present

## 2018-10-03 DIAGNOSIS — R4701 Aphasia: Secondary | ICD-10-CM | POA: Diagnosis not present

## 2018-10-03 DIAGNOSIS — Z7901 Long term (current) use of anticoagulants: Secondary | ICD-10-CM | POA: Diagnosis not present

## 2018-10-03 DIAGNOSIS — E039 Hypothyroidism, unspecified: Secondary | ICD-10-CM | POA: Diagnosis not present

## 2018-10-03 DIAGNOSIS — D849 Immunodeficiency, unspecified: Secondary | ICD-10-CM | POA: Diagnosis not present

## 2018-10-03 DIAGNOSIS — E1122 Type 2 diabetes mellitus with diabetic chronic kidney disease: Secondary | ICD-10-CM | POA: Diagnosis not present

## 2018-10-03 DIAGNOSIS — Z940 Kidney transplant status: Secondary | ICD-10-CM | POA: Diagnosis not present

## 2018-10-03 DIAGNOSIS — K219 Gastro-esophageal reflux disease without esophagitis: Secondary | ICD-10-CM | POA: Diagnosis not present

## 2018-10-03 DIAGNOSIS — N189 Chronic kidney disease, unspecified: Secondary | ICD-10-CM | POA: Diagnosis not present

## 2018-10-03 DIAGNOSIS — I700 Atherosclerosis of aorta: Secondary | ICD-10-CM | POA: Diagnosis not present

## 2018-10-03 DIAGNOSIS — G8929 Other chronic pain: Secondary | ICD-10-CM | POA: Diagnosis not present

## 2018-10-03 DIAGNOSIS — M6281 Muscle weakness (generalized): Secondary | ICD-10-CM | POA: Diagnosis not present

## 2018-10-03 DIAGNOSIS — M810 Age-related osteoporosis without current pathological fracture: Secondary | ICD-10-CM | POA: Diagnosis not present

## 2018-10-03 DIAGNOSIS — I5030 Unspecified diastolic (congestive) heart failure: Secondary | ICD-10-CM | POA: Diagnosis not present

## 2018-10-03 DIAGNOSIS — R2689 Other abnormalities of gait and mobility: Secondary | ICD-10-CM | POA: Diagnosis not present

## 2018-10-03 DIAGNOSIS — M329 Systemic lupus erythematosus, unspecified: Secondary | ICD-10-CM | POA: Diagnosis not present

## 2018-10-03 DIAGNOSIS — I130 Hypertensive heart and chronic kidney disease with heart failure and stage 1 through stage 4 chronic kidney disease, or unspecified chronic kidney disease: Secondary | ICD-10-CM | POA: Diagnosis not present

## 2018-10-03 DIAGNOSIS — M109 Gout, unspecified: Secondary | ICD-10-CM | POA: Diagnosis not present

## 2018-10-03 MED ORDER — DILTIAZEM HCL ER BEADS 360 MG PO CP24: 360.0000 mg | ORAL_CAPSULE | Freq: Every day | ORAL | 1 refills | Status: DC

## 2018-10-03 NOTE — Telephone Encounter (Signed)
Patient given recommendations per Dr. Jordan and verbalized understanding. 

## 2018-10-03 NOTE — Telephone Encounter (Signed)
Diltiazem to be increased from 240 mg to 360 mg daily. Continue monitoring BP and HR.  Thanks, BJ

## 2018-10-03 NOTE — Telephone Encounter (Signed)
Message sent to Dr. Jordan for review. Please advise 

## 2018-10-03 NOTE — Telephone Encounter (Signed)
Copied from [place] [id number]. Topic: General - Other >> Oct 03, 2018 10:43 AM Celene Kras A wrote: Reason for CRM: Thermon Leyland, from [facility], called stating pts blood pressure levels are high. When first checked it was 170/100. Thermon Leyland stated pt took her blood pressure medication 45 minutes ago and it was still high at 174/100. Please advise. Thermon Leyland can be reached directly at [phone].

## 2018-10-05 DIAGNOSIS — E1122 Type 2 diabetes mellitus with diabetic chronic kidney disease: Secondary | ICD-10-CM | POA: Diagnosis not present

## 2018-10-05 DIAGNOSIS — R4701 Aphasia: Secondary | ICD-10-CM | POA: Diagnosis not present

## 2018-10-05 DIAGNOSIS — D849 Immunodeficiency, unspecified: Secondary | ICD-10-CM | POA: Diagnosis not present

## 2018-10-05 DIAGNOSIS — Z940 Kidney transplant status: Secondary | ICD-10-CM | POA: Diagnosis not present

## 2018-10-05 DIAGNOSIS — M6281 Muscle weakness (generalized): Secondary | ICD-10-CM | POA: Diagnosis not present

## 2018-10-05 DIAGNOSIS — I130 Hypertensive heart and chronic kidney disease with heart failure and stage 1 through stage 4 chronic kidney disease, or unspecified chronic kidney disease: Secondary | ICD-10-CM | POA: Diagnosis not present

## 2018-10-05 DIAGNOSIS — M109 Gout, unspecified: Secondary | ICD-10-CM | POA: Diagnosis not present

## 2018-10-05 DIAGNOSIS — N189 Chronic kidney disease, unspecified: Secondary | ICD-10-CM | POA: Diagnosis not present

## 2018-10-05 DIAGNOSIS — R1031 Right lower quadrant pain: Secondary | ICD-10-CM | POA: Diagnosis not present

## 2018-10-05 DIAGNOSIS — K219 Gastro-esophageal reflux disease without esophagitis: Secondary | ICD-10-CM | POA: Diagnosis not present

## 2018-10-05 DIAGNOSIS — I700 Atherosclerosis of aorta: Secondary | ICD-10-CM | POA: Diagnosis not present

## 2018-10-05 DIAGNOSIS — E785 Hyperlipidemia, unspecified: Secondary | ICD-10-CM | POA: Diagnosis not present

## 2018-10-05 DIAGNOSIS — E039 Hypothyroidism, unspecified: Secondary | ICD-10-CM | POA: Diagnosis not present

## 2018-10-05 DIAGNOSIS — Z7901 Long term (current) use of anticoagulants: Secondary | ICD-10-CM | POA: Diagnosis not present

## 2018-10-05 DIAGNOSIS — I5030 Unspecified diastolic (congestive) heart failure: Secondary | ICD-10-CM | POA: Diagnosis not present

## 2018-10-05 DIAGNOSIS — M329 Systemic lupus erythematosus, unspecified: Secondary | ICD-10-CM | POA: Diagnosis not present

## 2018-10-05 DIAGNOSIS — D631 Anemia in chronic kidney disease: Secondary | ICD-10-CM | POA: Diagnosis not present

## 2018-10-05 DIAGNOSIS — R2689 Other abnormalities of gait and mobility: Secondary | ICD-10-CM | POA: Diagnosis not present

## 2018-10-05 DIAGNOSIS — G8929 Other chronic pain: Secondary | ICD-10-CM | POA: Diagnosis not present

## 2018-10-05 DIAGNOSIS — M810 Age-related osteoporosis without current pathological fracture: Secondary | ICD-10-CM | POA: Diagnosis not present

## 2018-10-06 ENCOUNTER — Ambulatory Visit: Payer: Medicare Other | Admitting: Family Medicine

## 2018-10-06 ENCOUNTER — Other Ambulatory Visit: Payer: Self-pay

## 2018-10-09 ENCOUNTER — Ambulatory Visit: Payer: Medicare Other | Admitting: Family Medicine

## 2018-10-09 DIAGNOSIS — I130 Hypertensive heart and chronic kidney disease with heart failure and stage 1 through stage 4 chronic kidney disease, or unspecified chronic kidney disease: Secondary | ICD-10-CM | POA: Diagnosis not present

## 2018-10-09 DIAGNOSIS — M329 Systemic lupus erythematosus, unspecified: Secondary | ICD-10-CM | POA: Diagnosis not present

## 2018-10-09 DIAGNOSIS — N189 Chronic kidney disease, unspecified: Secondary | ICD-10-CM | POA: Diagnosis not present

## 2018-10-09 DIAGNOSIS — E039 Hypothyroidism, unspecified: Secondary | ICD-10-CM | POA: Diagnosis not present

## 2018-10-09 DIAGNOSIS — D849 Immunodeficiency, unspecified: Secondary | ICD-10-CM | POA: Diagnosis not present

## 2018-10-09 DIAGNOSIS — Z7901 Long term (current) use of anticoagulants: Secondary | ICD-10-CM | POA: Diagnosis not present

## 2018-10-09 DIAGNOSIS — G8929 Other chronic pain: Secondary | ICD-10-CM | POA: Diagnosis not present

## 2018-10-09 DIAGNOSIS — E1122 Type 2 diabetes mellitus with diabetic chronic kidney disease: Secondary | ICD-10-CM | POA: Diagnosis not present

## 2018-10-09 DIAGNOSIS — R2689 Other abnormalities of gait and mobility: Secondary | ICD-10-CM | POA: Diagnosis not present

## 2018-10-09 DIAGNOSIS — M810 Age-related osteoporosis without current pathological fracture: Secondary | ICD-10-CM | POA: Diagnosis not present

## 2018-10-09 DIAGNOSIS — E785 Hyperlipidemia, unspecified: Secondary | ICD-10-CM | POA: Diagnosis not present

## 2018-10-09 DIAGNOSIS — R4701 Aphasia: Secondary | ICD-10-CM | POA: Diagnosis not present

## 2018-10-09 DIAGNOSIS — M109 Gout, unspecified: Secondary | ICD-10-CM | POA: Diagnosis not present

## 2018-10-09 DIAGNOSIS — K219 Gastro-esophageal reflux disease without esophagitis: Secondary | ICD-10-CM | POA: Diagnosis not present

## 2018-10-09 DIAGNOSIS — M6281 Muscle weakness (generalized): Secondary | ICD-10-CM | POA: Diagnosis not present

## 2018-10-09 DIAGNOSIS — I700 Atherosclerosis of aorta: Secondary | ICD-10-CM | POA: Diagnosis not present

## 2018-10-09 DIAGNOSIS — D631 Anemia in chronic kidney disease: Secondary | ICD-10-CM | POA: Diagnosis not present

## 2018-10-09 DIAGNOSIS — R1031 Right lower quadrant pain: Secondary | ICD-10-CM | POA: Diagnosis not present

## 2018-10-09 DIAGNOSIS — Z940 Kidney transplant status: Secondary | ICD-10-CM | POA: Diagnosis not present

## 2018-10-09 DIAGNOSIS — I5030 Unspecified diastolic (congestive) heart failure: Secondary | ICD-10-CM | POA: Diagnosis not present

## 2018-10-10 DIAGNOSIS — E1122 Type 2 diabetes mellitus with diabetic chronic kidney disease: Secondary | ICD-10-CM | POA: Diagnosis not present

## 2018-10-10 DIAGNOSIS — I130 Hypertensive heart and chronic kidney disease with heart failure and stage 1 through stage 4 chronic kidney disease, or unspecified chronic kidney disease: Secondary | ICD-10-CM | POA: Diagnosis not present

## 2018-10-10 DIAGNOSIS — K219 Gastro-esophageal reflux disease without esophagitis: Secondary | ICD-10-CM | POA: Diagnosis not present

## 2018-10-10 DIAGNOSIS — M810 Age-related osteoporosis without current pathological fracture: Secondary | ICD-10-CM | POA: Diagnosis not present

## 2018-10-10 DIAGNOSIS — Z940 Kidney transplant status: Secondary | ICD-10-CM | POA: Diagnosis not present

## 2018-10-10 DIAGNOSIS — I700 Atherosclerosis of aorta: Secondary | ICD-10-CM | POA: Diagnosis not present

## 2018-10-10 DIAGNOSIS — Z7901 Long term (current) use of anticoagulants: Secondary | ICD-10-CM | POA: Diagnosis not present

## 2018-10-10 DIAGNOSIS — M109 Gout, unspecified: Secondary | ICD-10-CM | POA: Diagnosis not present

## 2018-10-10 DIAGNOSIS — E785 Hyperlipidemia, unspecified: Secondary | ICD-10-CM | POA: Diagnosis not present

## 2018-10-10 DIAGNOSIS — I5030 Unspecified diastolic (congestive) heart failure: Secondary | ICD-10-CM | POA: Diagnosis not present

## 2018-10-10 DIAGNOSIS — R2689 Other abnormalities of gait and mobility: Secondary | ICD-10-CM | POA: Diagnosis not present

## 2018-10-10 DIAGNOSIS — R4701 Aphasia: Secondary | ICD-10-CM | POA: Diagnosis not present

## 2018-10-10 DIAGNOSIS — E039 Hypothyroidism, unspecified: Secondary | ICD-10-CM | POA: Diagnosis not present

## 2018-10-10 DIAGNOSIS — M6281 Muscle weakness (generalized): Secondary | ICD-10-CM | POA: Diagnosis not present

## 2018-10-10 DIAGNOSIS — M329 Systemic lupus erythematosus, unspecified: Secondary | ICD-10-CM | POA: Diagnosis not present

## 2018-10-10 DIAGNOSIS — D849 Immunodeficiency, unspecified: Secondary | ICD-10-CM | POA: Diagnosis not present

## 2018-10-10 DIAGNOSIS — D631 Anemia in chronic kidney disease: Secondary | ICD-10-CM | POA: Diagnosis not present

## 2018-10-10 DIAGNOSIS — G8929 Other chronic pain: Secondary | ICD-10-CM | POA: Diagnosis not present

## 2018-10-10 DIAGNOSIS — N189 Chronic kidney disease, unspecified: Secondary | ICD-10-CM | POA: Diagnosis not present

## 2018-10-10 DIAGNOSIS — R1031 Right lower quadrant pain: Secondary | ICD-10-CM | POA: Diagnosis not present

## 2018-10-12 ENCOUNTER — Telehealth: Payer: Self-pay | Admitting: *Deleted

## 2018-10-12 ENCOUNTER — Ambulatory Visit: Payer: Medicare Other | Admitting: Obstetrics and Gynecology

## 2018-10-12 DIAGNOSIS — M810 Age-related osteoporosis without current pathological fracture: Secondary | ICD-10-CM | POA: Diagnosis not present

## 2018-10-12 DIAGNOSIS — N189 Chronic kidney disease, unspecified: Secondary | ICD-10-CM | POA: Diagnosis not present

## 2018-10-12 DIAGNOSIS — Z7901 Long term (current) use of anticoagulants: Secondary | ICD-10-CM | POA: Diagnosis not present

## 2018-10-12 DIAGNOSIS — E785 Hyperlipidemia, unspecified: Secondary | ICD-10-CM | POA: Diagnosis not present

## 2018-10-12 DIAGNOSIS — R1031 Right lower quadrant pain: Secondary | ICD-10-CM | POA: Diagnosis not present

## 2018-10-12 DIAGNOSIS — I700 Atherosclerosis of aorta: Secondary | ICD-10-CM | POA: Diagnosis not present

## 2018-10-12 DIAGNOSIS — E039 Hypothyroidism, unspecified: Secondary | ICD-10-CM | POA: Diagnosis not present

## 2018-10-12 DIAGNOSIS — K219 Gastro-esophageal reflux disease without esophagitis: Secondary | ICD-10-CM | POA: Diagnosis not present

## 2018-10-12 DIAGNOSIS — M6281 Muscle weakness (generalized): Secondary | ICD-10-CM | POA: Diagnosis not present

## 2018-10-12 DIAGNOSIS — D631 Anemia in chronic kidney disease: Secondary | ICD-10-CM | POA: Diagnosis not present

## 2018-10-12 DIAGNOSIS — I5030 Unspecified diastolic (congestive) heart failure: Secondary | ICD-10-CM | POA: Diagnosis not present

## 2018-10-12 DIAGNOSIS — M329 Systemic lupus erythematosus, unspecified: Secondary | ICD-10-CM | POA: Diagnosis not present

## 2018-10-12 DIAGNOSIS — D849 Immunodeficiency, unspecified: Secondary | ICD-10-CM | POA: Diagnosis not present

## 2018-10-12 DIAGNOSIS — E1122 Type 2 diabetes mellitus with diabetic chronic kidney disease: Secondary | ICD-10-CM | POA: Diagnosis not present

## 2018-10-12 DIAGNOSIS — G8929 Other chronic pain: Secondary | ICD-10-CM | POA: Diagnosis not present

## 2018-10-12 DIAGNOSIS — R4701 Aphasia: Secondary | ICD-10-CM | POA: Diagnosis not present

## 2018-10-12 DIAGNOSIS — M109 Gout, unspecified: Secondary | ICD-10-CM | POA: Diagnosis not present

## 2018-10-12 DIAGNOSIS — Z940 Kidney transplant status: Secondary | ICD-10-CM | POA: Diagnosis not present

## 2018-10-12 DIAGNOSIS — I130 Hypertensive heart and chronic kidney disease with heart failure and stage 1 through stage 4 chronic kidney disease, or unspecified chronic kidney disease: Secondary | ICD-10-CM | POA: Diagnosis not present

## 2018-10-12 DIAGNOSIS — R2689 Other abnormalities of gait and mobility: Secondary | ICD-10-CM | POA: Diagnosis not present

## 2018-10-12 NOTE — Telephone Encounter (Signed)
Spoke with Kennieth Rad and she stated that they wanted to know about checking patient's PTINR, will need an order [facility]ed to [facility] if PCP wants them to check it when they got out. Please advise.

## 2018-10-12 NOTE — Telephone Encounter (Signed)
Copied from [place] [id number]. Topic: General - Other >> Oct 12, 2018 10:54 AM Carolyn Stare wrote:  Connie Ruiz with [facility] call to say pt said she is on  Coumadin and they don't have an orders for this

## 2018-10-13 ENCOUNTER — Ambulatory Visit (INDEPENDENT_AMBULATORY_CARE_PROVIDER_SITE_OTHER): Payer: Medicare Other | Admitting: Family Medicine

## 2018-10-13 ENCOUNTER — Encounter: Payer: Self-pay | Admitting: Family Medicine

## 2018-10-13 ENCOUNTER — Other Ambulatory Visit: Payer: Self-pay

## 2018-10-13 DIAGNOSIS — F0151 Vascular dementia with behavioral disturbance: Secondary | ICD-10-CM

## 2018-10-13 DIAGNOSIS — Z992 Dependence on renal dialysis: Secondary | ICD-10-CM

## 2018-10-13 DIAGNOSIS — E890 Postprocedural hypothyroidism: Secondary | ICD-10-CM | POA: Diagnosis not present

## 2018-10-13 DIAGNOSIS — Z7901 Long term (current) use of anticoagulants: Secondary | ICD-10-CM | POA: Diagnosis not present

## 2018-10-13 DIAGNOSIS — N186 End stage renal disease: Secondary | ICD-10-CM

## 2018-10-13 DIAGNOSIS — E1122 Type 2 diabetes mellitus with diabetic chronic kidney disease: Secondary | ICD-10-CM | POA: Diagnosis not present

## 2018-10-13 DIAGNOSIS — F01518 Vascular dementia, unspecified severity, with other behavioral disturbance: Secondary | ICD-10-CM

## 2018-10-13 DIAGNOSIS — I10 Essential (primary) hypertension: Secondary | ICD-10-CM

## 2018-10-13 MED ORDER — ACCU-CHEK AVIVA PLUS W/DEVICE KIT: 1.0000 | PACK | Freq: Every day | 1 refills | Status: DC

## 2018-10-13 NOTE — Assessment & Plan Note (Signed)
Reporting problem as a stable. Oriented x3 today. No changes in current management. Continue following with neurologist.

## 2018-10-13 NOTE — Telephone Encounter (Signed)
Verbal authorization for INR can be given. Thanks, BJ

## 2018-10-13 NOTE — Progress Notes (Signed)
Virtual Visit via Telephone Note  I connected with Connie Ruiz on 10/14/18 at 11:00 AM EDT by telephone and verified that I am speaking with the correct person using two identifiers.   I discussed the limitations, risks, security and privacy concerns of performing an evaluation and management service by telephone and the availability of in person appointments. I also discussed with the patient that there may be a patient responsible charge related to this service. She expressed understanding and agreed to proceed.  Location patient: home Location provider: work office Participants present for the call: patient, provider Patient did not have a visit in the prior 7 days to address this/these issue(s).   History of Present Illness: Connie Ruiz is a 58 yo with Hx of DM 2,HTN,s/p kidney transplant,Hx of PE/DVT,CVA with residual deficit,lupus,post-surgical hypothyroidism,and GERD among some.  Former PCP: Dr Loanne Drilling. [place] preventive visit 10/2018  She was hospitalized from 08/13/2020 08/22/2018, discharged to an SNF, she has been home since 09/29/18. She presented to the hospital on the day of admission complaining of flulike symptoms: 3 days of fever, productive cough, abdominal pain, nausea, vomiting, and diarrhea.  Abdominal pain seems to be a chronic problem. Dominant CT showed some thickening of the jejunal area. She was discharged on Nexium and Sulcrafate.   DM II: Dx in 2009.  She is on metformin XR 500 mg once daily. Denies abdominal pain, nausea,vomiting, polydipsia,polyuria, or polyphagia. Medication is well tolerated,no side effects reported.  She is not checking BS,she needs a new glucometer.  Lab Results  Component Value Date   HGBA1C 6.7 (H) 08/15/2018     HTN: Negative for unusual headache, visual changes, chest pain, dyspnea, new focal deficit, or edema. She is on Losartan 100 mg daily and Diltiazem 360 mg daily. Negative for unusual headache,visual changes,chest  pain,dyspnea,palpitations,abdominal pain,or urinary symptoms.  She is not checking BP at home.  Lab Results  Component Value Date   CREATININE 0.85 08/21/2018   BUN <5 (L) 08/21/2018   NA 143 08/21/2018   K 3.4 (L) 08/21/2018   CL 108 08/21/2018   CO2 27 08/21/2018   Hypothyroidism: Currently she is on levothyroxine 150 mcg daily. Tolerating medication well, no side effects reported. She has not noted dysphagia, palpitations, abdominal pain, changes in bowel habits, cold/heat intolerance, or abnormal weight loss. + Fatigue. Lab Results  Component Value Date   TSH 64.113 (H) 08/14/2018   Vascular dementia, she is following with neurologist. Currently she is on Aricept 10 mg daily. She lives alone, her daughter lives close by. She is on Coumadin therapy, history of DVT and PE. Last INR 3.3 about a month ago. She has not noted nosebleed, gum bleeding, blood in the stool, gross hematuria, melena, or more bruising than usual.  Observations/Objective: Patient sounds cheerful and well on the phone. I do not appreciate any SOB. Thought processing are grossly intact. Patient reported vitals:  Assessment and Plan:  Orders Placed This Encounter  Procedures  . Basic metabolic panel  . TSH    Hypothyroidism, postsurgical Problem is not well controlled. We will arrange lab appointment to check TSH and will make recommendations accordingly. For now she will continue levothyroxine 115 mcg daily.  Essential hypertension No changes in current management. Continue low-salt diet. Recommend monitoring BP regularly. Follow-up in 3 to 4 months.  Type 2 diabetes mellitus ([place]) Problem is well controlled. She is due for A1c in 01/2019. No changes in current management. At least annual eye exam and adequate foot care  recommended. Follow-up in 3 to 4 months.  Vascular dementia Reporting problem as a stable. Oriented x3 today. No changes in current management. Continue following  with neurologist.  Long term (current) use of anticoagulants [Z79.01] Supratherapeutic INR. INR will be arranged through home health.    Follow Up Instructions: Lab appt will be arranged.   I did not refer this patient for an OV in the next 24 hours for this/these issue(s).  I discussed the assessment and treatment plan with the patient. The patient was provided an opportunity to ask questions and all were answered. The patient agreed with the plan and demonstrated an understanding of the instructions.   The patient was advised to call back or seek an in-person evaluation if the symptoms worsen or if the condition fails to improve as anticipated.  I provided 25 minutes of non-face-to-face time during this encounter.    [name], MD

## 2018-10-13 NOTE — Assessment & Plan Note (Signed)
Problem is not well controlled. We will arrange lab appointment to check TSH and will make recommendations accordingly. For now she will continue levothyroxine 115 mcg daily.

## 2018-10-13 NOTE — Assessment & Plan Note (Signed)
No changes in current management. Continue low-salt diet. Recommend monitoring BP regularly. Follow-up in 3 to 4 months.

## 2018-10-13 NOTE — Assessment & Plan Note (Signed)
Problem is well controlled. She is due for A1c in 01/2019. No changes in current management. At least annual eye exam and adequate foot care recommended. Follow-up in 3 to 4 months.

## 2018-10-13 NOTE — Assessment & Plan Note (Signed)
Supratherapeutic INR. INR will be arranged through home health.

## 2018-10-16 ENCOUNTER — Other Ambulatory Visit: Payer: Medicare Other

## 2018-10-17 NOTE — Telephone Encounter (Signed)
Spoke with Thermon Leyland and gave verbal orders for INR. Order faxed to [facility] on 10/13/2018.

## 2018-10-18 ENCOUNTER — Other Ambulatory Visit (INDEPENDENT_AMBULATORY_CARE_PROVIDER_SITE_OTHER): Payer: Medicare Other

## 2018-10-18 ENCOUNTER — Other Ambulatory Visit: Payer: Self-pay

## 2018-10-18 DIAGNOSIS — D849 Immunodeficiency, unspecified: Secondary | ICD-10-CM | POA: Diagnosis not present

## 2018-10-18 DIAGNOSIS — R2689 Other abnormalities of gait and mobility: Secondary | ICD-10-CM | POA: Diagnosis not present

## 2018-10-18 DIAGNOSIS — E1122 Type 2 diabetes mellitus with diabetic chronic kidney disease: Secondary | ICD-10-CM | POA: Diagnosis not present

## 2018-10-18 DIAGNOSIS — R1031 Right lower quadrant pain: Secondary | ICD-10-CM | POA: Diagnosis not present

## 2018-10-18 DIAGNOSIS — E890 Postprocedural hypothyroidism: Secondary | ICD-10-CM

## 2018-10-18 DIAGNOSIS — G8929 Other chronic pain: Secondary | ICD-10-CM | POA: Diagnosis not present

## 2018-10-18 DIAGNOSIS — M109 Gout, unspecified: Secondary | ICD-10-CM | POA: Diagnosis not present

## 2018-10-18 DIAGNOSIS — E039 Hypothyroidism, unspecified: Secondary | ICD-10-CM | POA: Diagnosis not present

## 2018-10-18 DIAGNOSIS — I700 Atherosclerosis of aorta: Secondary | ICD-10-CM | POA: Diagnosis not present

## 2018-10-18 DIAGNOSIS — I5030 Unspecified diastolic (congestive) heart failure: Secondary | ICD-10-CM | POA: Diagnosis not present

## 2018-10-18 DIAGNOSIS — R4701 Aphasia: Secondary | ICD-10-CM | POA: Diagnosis not present

## 2018-10-18 DIAGNOSIS — M810 Age-related osteoporosis without current pathological fracture: Secondary | ICD-10-CM | POA: Diagnosis not present

## 2018-10-18 DIAGNOSIS — M329 Systemic lupus erythematosus, unspecified: Secondary | ICD-10-CM | POA: Diagnosis not present

## 2018-10-18 DIAGNOSIS — D631 Anemia in chronic kidney disease: Secondary | ICD-10-CM | POA: Diagnosis not present

## 2018-10-18 DIAGNOSIS — K219 Gastro-esophageal reflux disease without esophagitis: Secondary | ICD-10-CM | POA: Diagnosis not present

## 2018-10-18 DIAGNOSIS — M6281 Muscle weakness (generalized): Secondary | ICD-10-CM | POA: Diagnosis not present

## 2018-10-18 DIAGNOSIS — E785 Hyperlipidemia, unspecified: Secondary | ICD-10-CM | POA: Diagnosis not present

## 2018-10-18 DIAGNOSIS — N189 Chronic kidney disease, unspecified: Secondary | ICD-10-CM | POA: Diagnosis not present

## 2018-10-18 DIAGNOSIS — I10 Essential (primary) hypertension: Secondary | ICD-10-CM | POA: Diagnosis not present

## 2018-10-18 DIAGNOSIS — I130 Hypertensive heart and chronic kidney disease with heart failure and stage 1 through stage 4 chronic kidney disease, or unspecified chronic kidney disease: Secondary | ICD-10-CM | POA: Diagnosis not present

## 2018-10-18 DIAGNOSIS — Z7901 Long term (current) use of anticoagulants: Secondary | ICD-10-CM | POA: Diagnosis not present

## 2018-10-18 DIAGNOSIS — Z940 Kidney transplant status: Secondary | ICD-10-CM | POA: Diagnosis not present

## 2018-10-18 LAB — BASIC METABOLIC PANEL
BUN: 16 mg/dL (ref 6–23)
CO2: 26 mEq/L (ref 19–32)
Calcium: 9.4 mg/dL (ref 8.4–10.5)
Chloride: 100 mEq/L (ref 96–112)
Creatinine, Ser: 0.57 mg/dL (ref 0.40–1.20)
GFR: 131.8 mL/min (ref >60.00)
Glucose, Bld: 127 mg/dL — ABNORMAL HIGH (ref 70–99)
Potassium: 3.3 mEq/L — ABNORMAL LOW (ref 3.5–5.1)
Sodium: 139 mEq/L (ref 135–145)

## 2018-10-18 LAB — TSH: TSH: 40.91 u[IU]/mL — ABNORMAL HIGH (ref 0.35–4.50)

## 2018-10-19 DIAGNOSIS — M329 Systemic lupus erythematosus, unspecified: Secondary | ICD-10-CM | POA: Diagnosis not present

## 2018-10-19 DIAGNOSIS — K219 Gastro-esophageal reflux disease without esophagitis: Secondary | ICD-10-CM | POA: Diagnosis not present

## 2018-10-19 DIAGNOSIS — M6281 Muscle weakness (generalized): Secondary | ICD-10-CM | POA: Diagnosis not present

## 2018-10-19 DIAGNOSIS — R1031 Right lower quadrant pain: Secondary | ICD-10-CM | POA: Diagnosis not present

## 2018-10-19 DIAGNOSIS — M109 Gout, unspecified: Secondary | ICD-10-CM | POA: Diagnosis not present

## 2018-10-19 DIAGNOSIS — E1122 Type 2 diabetes mellitus with diabetic chronic kidney disease: Secondary | ICD-10-CM | POA: Diagnosis not present

## 2018-10-19 DIAGNOSIS — Z940 Kidney transplant status: Secondary | ICD-10-CM | POA: Diagnosis not present

## 2018-10-19 DIAGNOSIS — E039 Hypothyroidism, unspecified: Secondary | ICD-10-CM | POA: Diagnosis not present

## 2018-10-19 DIAGNOSIS — Z7901 Long term (current) use of anticoagulants: Secondary | ICD-10-CM | POA: Diagnosis not present

## 2018-10-19 DIAGNOSIS — I700 Atherosclerosis of aorta: Secondary | ICD-10-CM | POA: Diagnosis not present

## 2018-10-19 DIAGNOSIS — N189 Chronic kidney disease, unspecified: Secondary | ICD-10-CM | POA: Diagnosis not present

## 2018-10-19 DIAGNOSIS — I5030 Unspecified diastolic (congestive) heart failure: Secondary | ICD-10-CM | POA: Diagnosis not present

## 2018-10-19 DIAGNOSIS — D631 Anemia in chronic kidney disease: Secondary | ICD-10-CM | POA: Diagnosis not present

## 2018-10-19 DIAGNOSIS — G8929 Other chronic pain: Secondary | ICD-10-CM | POA: Diagnosis not present

## 2018-10-19 DIAGNOSIS — I130 Hypertensive heart and chronic kidney disease with heart failure and stage 1 through stage 4 chronic kidney disease, or unspecified chronic kidney disease: Secondary | ICD-10-CM | POA: Diagnosis not present

## 2018-10-19 DIAGNOSIS — E785 Hyperlipidemia, unspecified: Secondary | ICD-10-CM | POA: Diagnosis not present

## 2018-10-19 DIAGNOSIS — R2689 Other abnormalities of gait and mobility: Secondary | ICD-10-CM | POA: Diagnosis not present

## 2018-10-19 DIAGNOSIS — M810 Age-related osteoporosis without current pathological fracture: Secondary | ICD-10-CM | POA: Diagnosis not present

## 2018-10-19 DIAGNOSIS — D849 Immunodeficiency, unspecified: Secondary | ICD-10-CM | POA: Diagnosis not present

## 2018-10-19 DIAGNOSIS — R4701 Aphasia: Secondary | ICD-10-CM | POA: Diagnosis not present

## 2018-10-21 ENCOUNTER — Telehealth: Payer: Self-pay

## 2018-10-21 NOTE — Telephone Encounter (Signed)
Copied from [place] [id number]. Topic: Quick Communication - Home Health Verbal Orders >> Oct 20, 2018  4:43 PM Erick Blinks wrote: Caller/Agency: Connie Ruiz, [place] Number: [phone] Requesting OT/PT/Skilled Nursing/Social Work/Speech Therapy: ms. Connie Ruiz stated that pt refused home visits this week, so staff was unable to provide service. Pt told staff that they are not sick but did not feel like being seen. Pt told staff that she will resume service next week. Frequency:

## 2018-10-23 NOTE — Telephone Encounter (Signed)
FYI sent to Dr. Jordan for review. 

## 2018-10-24 ENCOUNTER — Other Ambulatory Visit: Payer: Self-pay | Admitting: *Deleted

## 2018-10-24 MED ORDER — LEVOTHYROXINE SODIUM 175 MCG PO TABS: 175.0000 ug | ORAL_TABLET | Freq: Every day | ORAL | 2 refills | Status: DC

## 2018-10-26 DIAGNOSIS — Z7901 Long term (current) use of anticoagulants: Secondary | ICD-10-CM | POA: Diagnosis not present

## 2018-10-26 DIAGNOSIS — Z940 Kidney transplant status: Secondary | ICD-10-CM | POA: Diagnosis not present

## 2018-10-26 DIAGNOSIS — R4701 Aphasia: Secondary | ICD-10-CM | POA: Diagnosis not present

## 2018-10-26 DIAGNOSIS — G8929 Other chronic pain: Secondary | ICD-10-CM | POA: Diagnosis not present

## 2018-10-26 DIAGNOSIS — K219 Gastro-esophageal reflux disease without esophagitis: Secondary | ICD-10-CM | POA: Diagnosis not present

## 2018-10-26 DIAGNOSIS — R1031 Right lower quadrant pain: Secondary | ICD-10-CM | POA: Diagnosis not present

## 2018-10-26 DIAGNOSIS — M109 Gout, unspecified: Secondary | ICD-10-CM | POA: Diagnosis not present

## 2018-10-26 DIAGNOSIS — I130 Hypertensive heart and chronic kidney disease with heart failure and stage 1 through stage 4 chronic kidney disease, or unspecified chronic kidney disease: Secondary | ICD-10-CM | POA: Diagnosis not present

## 2018-10-26 DIAGNOSIS — E1122 Type 2 diabetes mellitus with diabetic chronic kidney disease: Secondary | ICD-10-CM | POA: Diagnosis not present

## 2018-10-26 DIAGNOSIS — E785 Hyperlipidemia, unspecified: Secondary | ICD-10-CM | POA: Diagnosis not present

## 2018-10-26 DIAGNOSIS — M810 Age-related osteoporosis without current pathological fracture: Secondary | ICD-10-CM | POA: Diagnosis not present

## 2018-10-26 DIAGNOSIS — E039 Hypothyroidism, unspecified: Secondary | ICD-10-CM | POA: Diagnosis not present

## 2018-10-26 DIAGNOSIS — I5030 Unspecified diastolic (congestive) heart failure: Secondary | ICD-10-CM | POA: Diagnosis not present

## 2018-10-26 DIAGNOSIS — N189 Chronic kidney disease, unspecified: Secondary | ICD-10-CM | POA: Diagnosis not present

## 2018-10-26 DIAGNOSIS — M6281 Muscle weakness (generalized): Secondary | ICD-10-CM | POA: Diagnosis not present

## 2018-10-26 DIAGNOSIS — I700 Atherosclerosis of aorta: Secondary | ICD-10-CM | POA: Diagnosis not present

## 2018-10-26 DIAGNOSIS — D631 Anemia in chronic kidney disease: Secondary | ICD-10-CM | POA: Diagnosis not present

## 2018-10-26 DIAGNOSIS — R2689 Other abnormalities of gait and mobility: Secondary | ICD-10-CM | POA: Diagnosis not present

## 2018-10-26 DIAGNOSIS — D849 Immunodeficiency, unspecified: Secondary | ICD-10-CM | POA: Diagnosis not present

## 2018-10-26 DIAGNOSIS — M329 Systemic lupus erythematosus, unspecified: Secondary | ICD-10-CM | POA: Diagnosis not present

## 2018-10-28 DIAGNOSIS — I639 Cerebral infarction, unspecified: Secondary | ICD-10-CM | POA: Diagnosis not present

## 2018-10-28 DIAGNOSIS — E118 Type 2 diabetes mellitus with unspecified complications: Secondary | ICD-10-CM | POA: Diagnosis not present

## 2018-10-28 DIAGNOSIS — M150 Primary generalized (osteo)arthritis: Secondary | ICD-10-CM | POA: Diagnosis not present

## 2018-10-29 ENCOUNTER — Other Ambulatory Visit: Payer: Self-pay | Admitting: Endocrinology

## 2018-10-29 DIAGNOSIS — I639 Cerebral infarction, unspecified: Secondary | ICD-10-CM | POA: Diagnosis not present

## 2018-10-29 DIAGNOSIS — M150 Primary generalized (osteo)arthritis: Secondary | ICD-10-CM | POA: Diagnosis not present

## 2018-10-29 DIAGNOSIS — E118 Type 2 diabetes mellitus with unspecified complications: Secondary | ICD-10-CM | POA: Diagnosis not present

## 2018-10-29 NOTE — Telephone Encounter (Signed)
Please forward refill request to pt's new primary care provider.  

## 2018-10-30 DIAGNOSIS — I639 Cerebral infarction, unspecified: Secondary | ICD-10-CM | POA: Diagnosis not present

## 2018-10-30 DIAGNOSIS — E118 Type 2 diabetes mellitus with unspecified complications: Secondary | ICD-10-CM | POA: Diagnosis not present

## 2018-10-30 DIAGNOSIS — M150 Primary generalized (osteo)arthritis: Secondary | ICD-10-CM | POA: Diagnosis not present

## 2018-10-31 DIAGNOSIS — I130 Hypertensive heart and chronic kidney disease with heart failure and stage 1 through stage 4 chronic kidney disease, or unspecified chronic kidney disease: Secondary | ICD-10-CM | POA: Diagnosis not present

## 2018-10-31 DIAGNOSIS — M109 Gout, unspecified: Secondary | ICD-10-CM | POA: Diagnosis not present

## 2018-10-31 DIAGNOSIS — R1031 Right lower quadrant pain: Secondary | ICD-10-CM | POA: Diagnosis not present

## 2018-10-31 DIAGNOSIS — M329 Systemic lupus erythematosus, unspecified: Secondary | ICD-10-CM | POA: Diagnosis not present

## 2018-10-31 DIAGNOSIS — I5030 Unspecified diastolic (congestive) heart failure: Secondary | ICD-10-CM | POA: Diagnosis not present

## 2018-10-31 DIAGNOSIS — R4701 Aphasia: Secondary | ICD-10-CM | POA: Diagnosis not present

## 2018-10-31 DIAGNOSIS — M6281 Muscle weakness (generalized): Secondary | ICD-10-CM | POA: Diagnosis not present

## 2018-10-31 DIAGNOSIS — G8929 Other chronic pain: Secondary | ICD-10-CM | POA: Diagnosis not present

## 2018-10-31 DIAGNOSIS — E1122 Type 2 diabetes mellitus with diabetic chronic kidney disease: Secondary | ICD-10-CM | POA: Diagnosis not present

## 2018-10-31 DIAGNOSIS — Z940 Kidney transplant status: Secondary | ICD-10-CM | POA: Diagnosis not present

## 2018-10-31 DIAGNOSIS — N189 Chronic kidney disease, unspecified: Secondary | ICD-10-CM | POA: Diagnosis not present

## 2018-10-31 DIAGNOSIS — Z7901 Long term (current) use of anticoagulants: Secondary | ICD-10-CM | POA: Diagnosis not present

## 2018-10-31 DIAGNOSIS — D631 Anemia in chronic kidney disease: Secondary | ICD-10-CM | POA: Diagnosis not present

## 2018-10-31 DIAGNOSIS — E118 Type 2 diabetes mellitus with unspecified complications: Secondary | ICD-10-CM | POA: Diagnosis not present

## 2018-10-31 DIAGNOSIS — E039 Hypothyroidism, unspecified: Secondary | ICD-10-CM | POA: Diagnosis not present

## 2018-10-31 DIAGNOSIS — K219 Gastro-esophageal reflux disease without esophagitis: Secondary | ICD-10-CM | POA: Diagnosis not present

## 2018-10-31 DIAGNOSIS — R2689 Other abnormalities of gait and mobility: Secondary | ICD-10-CM | POA: Diagnosis not present

## 2018-10-31 DIAGNOSIS — D849 Immunodeficiency, unspecified: Secondary | ICD-10-CM | POA: Diagnosis not present

## 2018-10-31 DIAGNOSIS — M150 Primary generalized (osteo)arthritis: Secondary | ICD-10-CM | POA: Diagnosis not present

## 2018-10-31 DIAGNOSIS — E785 Hyperlipidemia, unspecified: Secondary | ICD-10-CM | POA: Diagnosis not present

## 2018-10-31 DIAGNOSIS — I639 Cerebral infarction, unspecified: Secondary | ICD-10-CM | POA: Diagnosis not present

## 2018-10-31 DIAGNOSIS — I700 Atherosclerosis of aorta: Secondary | ICD-10-CM | POA: Diagnosis not present

## 2018-10-31 DIAGNOSIS — M810 Age-related osteoporosis without current pathological fracture: Secondary | ICD-10-CM | POA: Diagnosis not present

## 2018-11-01 ENCOUNTER — Telehealth: Payer: Self-pay

## 2018-11-01 ENCOUNTER — Other Ambulatory Visit: Payer: Self-pay | Admitting: Endocrinology

## 2018-11-01 DIAGNOSIS — I639 Cerebral infarction, unspecified: Secondary | ICD-10-CM | POA: Diagnosis not present

## 2018-11-01 DIAGNOSIS — M150 Primary generalized (osteo)arthritis: Secondary | ICD-10-CM | POA: Diagnosis not present

## 2018-11-01 DIAGNOSIS — E118 Type 2 diabetes mellitus with unspecified complications: Secondary | ICD-10-CM | POA: Diagnosis not present

## 2018-11-01 NOTE — Telephone Encounter (Signed)
Spoke with Ronny Bacon and gave verbal orders for [facility].

## 2018-11-01 NOTE — Telephone Encounter (Signed)
Copied from [place] [id number]. Topic: Quick Communication - Home Health Verbal Orders >> Nov 01, 2018 11:08 AM Rainey Pines A wrote: Caller/Agency:Natasha with [facility] Callback Number:[phone] Requesting verbal order for PTINR on 11/08/2018 visit

## 2018-11-02 DIAGNOSIS — M150 Primary generalized (osteo)arthritis: Secondary | ICD-10-CM | POA: Diagnosis not present

## 2018-11-02 DIAGNOSIS — I639 Cerebral infarction, unspecified: Secondary | ICD-10-CM | POA: Diagnosis not present

## 2018-11-02 DIAGNOSIS — E118 Type 2 diabetes mellitus with unspecified complications: Secondary | ICD-10-CM | POA: Diagnosis not present

## 2018-11-03 ENCOUNTER — Ambulatory Visit: Payer: Medicare Other | Admitting: Obstetrics and Gynecology

## 2018-11-03 DIAGNOSIS — M150 Primary generalized (osteo)arthritis: Secondary | ICD-10-CM | POA: Diagnosis not present

## 2018-11-03 DIAGNOSIS — E118 Type 2 diabetes mellitus with unspecified complications: Secondary | ICD-10-CM | POA: Diagnosis not present

## 2018-11-03 DIAGNOSIS — I639 Cerebral infarction, unspecified: Secondary | ICD-10-CM | POA: Diagnosis not present

## 2018-11-04 DIAGNOSIS — I639 Cerebral infarction, unspecified: Secondary | ICD-10-CM | POA: Diagnosis not present

## 2018-11-04 DIAGNOSIS — E118 Type 2 diabetes mellitus with unspecified complications: Secondary | ICD-10-CM | POA: Diagnosis not present

## 2018-11-04 DIAGNOSIS — M150 Primary generalized (osteo)arthritis: Secondary | ICD-10-CM | POA: Diagnosis not present

## 2018-11-05 DIAGNOSIS — E118 Type 2 diabetes mellitus with unspecified complications: Secondary | ICD-10-CM | POA: Diagnosis not present

## 2018-11-05 DIAGNOSIS — M150 Primary generalized (osteo)arthritis: Secondary | ICD-10-CM | POA: Diagnosis not present

## 2018-11-05 DIAGNOSIS — I639 Cerebral infarction, unspecified: Secondary | ICD-10-CM | POA: Diagnosis not present

## 2018-11-06 ENCOUNTER — Telehealth: Payer: Self-pay | Admitting: *Deleted

## 2018-11-06 DIAGNOSIS — I5030 Unspecified diastolic (congestive) heart failure: Secondary | ICD-10-CM | POA: Diagnosis not present

## 2018-11-06 DIAGNOSIS — E1122 Type 2 diabetes mellitus with diabetic chronic kidney disease: Secondary | ICD-10-CM | POA: Diagnosis not present

## 2018-11-06 DIAGNOSIS — E039 Hypothyroidism, unspecified: Secondary | ICD-10-CM | POA: Diagnosis not present

## 2018-11-06 DIAGNOSIS — D631 Anemia in chronic kidney disease: Secondary | ICD-10-CM | POA: Diagnosis not present

## 2018-11-06 DIAGNOSIS — I639 Cerebral infarction, unspecified: Secondary | ICD-10-CM | POA: Diagnosis not present

## 2018-11-06 DIAGNOSIS — E785 Hyperlipidemia, unspecified: Secondary | ICD-10-CM | POA: Diagnosis not present

## 2018-11-06 DIAGNOSIS — M109 Gout, unspecified: Secondary | ICD-10-CM | POA: Diagnosis not present

## 2018-11-06 DIAGNOSIS — Z7901 Long term (current) use of anticoagulants: Secondary | ICD-10-CM | POA: Diagnosis not present

## 2018-11-06 DIAGNOSIS — I130 Hypertensive heart and chronic kidney disease with heart failure and stage 1 through stage 4 chronic kidney disease, or unspecified chronic kidney disease: Secondary | ICD-10-CM | POA: Diagnosis not present

## 2018-11-06 DIAGNOSIS — G8929 Other chronic pain: Secondary | ICD-10-CM | POA: Diagnosis not present

## 2018-11-06 DIAGNOSIS — M6281 Muscle weakness (generalized): Secondary | ICD-10-CM | POA: Diagnosis not present

## 2018-11-06 DIAGNOSIS — Z940 Kidney transplant status: Secondary | ICD-10-CM | POA: Diagnosis not present

## 2018-11-06 DIAGNOSIS — M150 Primary generalized (osteo)arthritis: Secondary | ICD-10-CM | POA: Diagnosis not present

## 2018-11-06 DIAGNOSIS — D849 Immunodeficiency, unspecified: Secondary | ICD-10-CM | POA: Diagnosis not present

## 2018-11-06 DIAGNOSIS — K219 Gastro-esophageal reflux disease without esophagitis: Secondary | ICD-10-CM | POA: Diagnosis not present

## 2018-11-06 DIAGNOSIS — I700 Atherosclerosis of aorta: Secondary | ICD-10-CM | POA: Diagnosis not present

## 2018-11-06 DIAGNOSIS — M810 Age-related osteoporosis without current pathological fracture: Secondary | ICD-10-CM | POA: Diagnosis not present

## 2018-11-06 DIAGNOSIS — M329 Systemic lupus erythematosus, unspecified: Secondary | ICD-10-CM | POA: Diagnosis not present

## 2018-11-06 DIAGNOSIS — R1031 Right lower quadrant pain: Secondary | ICD-10-CM | POA: Diagnosis not present

## 2018-11-06 DIAGNOSIS — E118 Type 2 diabetes mellitus with unspecified complications: Secondary | ICD-10-CM | POA: Diagnosis not present

## 2018-11-06 DIAGNOSIS — N189 Chronic kidney disease, unspecified: Secondary | ICD-10-CM | POA: Diagnosis not present

## 2018-11-06 DIAGNOSIS — R2689 Other abnormalities of gait and mobility: Secondary | ICD-10-CM | POA: Diagnosis not present

## 2018-11-06 DIAGNOSIS — R4701 Aphasia: Secondary | ICD-10-CM | POA: Diagnosis not present

## 2018-11-06 NOTE — Telephone Encounter (Signed)
Copied from [place] [id number]. Topic: General - Other >> Nov 06, 2018 12:31 PM Wynetta Emery, [place] C wrote: Reason for CRM:   Consuelo Pandy RN with [facility] called in to give provider pt's PTINR  results.     Result - 1.1      She also wanted to make provider aware that pt is being discharged today for skilled nursing.   CB: [phone]

## 2018-11-06 NOTE — Telephone Encounter (Signed)
According to medication instructions she is on Coumadin 5 mg Mon and Friday, 2.5 mg rest of the week. INR subtherapeutic. Increase Coumadin dose from 2.5 mg to 5 mg Tuesday and Wednesday, continue 5 mg Monday and Friday. Continue 2.5 mg Thursday,Sat,and Sunday. INR in a week.  Thanks, BJ

## 2018-11-06 NOTE — Telephone Encounter (Signed)
Message sent to Dr. Jordan for review. 

## 2018-11-07 DIAGNOSIS — M150 Primary generalized (osteo)arthritis: Secondary | ICD-10-CM | POA: Diagnosis not present

## 2018-11-07 DIAGNOSIS — E118 Type 2 diabetes mellitus with unspecified complications: Secondary | ICD-10-CM | POA: Diagnosis not present

## 2018-11-07 DIAGNOSIS — I639 Cerebral infarction, unspecified: Secondary | ICD-10-CM | POA: Diagnosis not present

## 2018-11-08 DIAGNOSIS — E118 Type 2 diabetes mellitus with unspecified complications: Secondary | ICD-10-CM | POA: Diagnosis not present

## 2018-11-08 DIAGNOSIS — M150 Primary generalized (osteo)arthritis: Secondary | ICD-10-CM | POA: Diagnosis not present

## 2018-11-08 DIAGNOSIS — I639 Cerebral infarction, unspecified: Secondary | ICD-10-CM | POA: Diagnosis not present

## 2018-11-08 NOTE — Telephone Encounter (Signed)
Patient given directions per Dr. [name] and verbalized understanding. Patient scheduled lab appointment for [facility].

## 2018-11-09 DIAGNOSIS — I700 Atherosclerosis of aorta: Secondary | ICD-10-CM | POA: Diagnosis not present

## 2018-11-09 DIAGNOSIS — R4701 Aphasia: Secondary | ICD-10-CM | POA: Diagnosis not present

## 2018-11-09 DIAGNOSIS — E118 Type 2 diabetes mellitus with unspecified complications: Secondary | ICD-10-CM | POA: Diagnosis not present

## 2018-11-09 DIAGNOSIS — E039 Hypothyroidism, unspecified: Secondary | ICD-10-CM | POA: Diagnosis not present

## 2018-11-09 DIAGNOSIS — G8929 Other chronic pain: Secondary | ICD-10-CM | POA: Diagnosis not present

## 2018-11-09 DIAGNOSIS — I130 Hypertensive heart and chronic kidney disease with heart failure and stage 1 through stage 4 chronic kidney disease, or unspecified chronic kidney disease: Secondary | ICD-10-CM | POA: Diagnosis not present

## 2018-11-09 DIAGNOSIS — N189 Chronic kidney disease, unspecified: Secondary | ICD-10-CM | POA: Diagnosis not present

## 2018-11-09 DIAGNOSIS — Z7901 Long term (current) use of anticoagulants: Secondary | ICD-10-CM | POA: Diagnosis not present

## 2018-11-09 DIAGNOSIS — K219 Gastro-esophageal reflux disease without esophagitis: Secondary | ICD-10-CM | POA: Diagnosis not present

## 2018-11-09 DIAGNOSIS — E785 Hyperlipidemia, unspecified: Secondary | ICD-10-CM | POA: Diagnosis not present

## 2018-11-09 DIAGNOSIS — D849 Immunodeficiency, unspecified: Secondary | ICD-10-CM | POA: Diagnosis not present

## 2018-11-09 DIAGNOSIS — D631 Anemia in chronic kidney disease: Secondary | ICD-10-CM | POA: Diagnosis not present

## 2018-11-09 DIAGNOSIS — R1031 Right lower quadrant pain: Secondary | ICD-10-CM | POA: Diagnosis not present

## 2018-11-09 DIAGNOSIS — I5030 Unspecified diastolic (congestive) heart failure: Secondary | ICD-10-CM | POA: Diagnosis not present

## 2018-11-09 DIAGNOSIS — M6281 Muscle weakness (generalized): Secondary | ICD-10-CM | POA: Diagnosis not present

## 2018-11-09 DIAGNOSIS — M150 Primary generalized (osteo)arthritis: Secondary | ICD-10-CM | POA: Diagnosis not present

## 2018-11-09 DIAGNOSIS — E1122 Type 2 diabetes mellitus with diabetic chronic kidney disease: Secondary | ICD-10-CM | POA: Diagnosis not present

## 2018-11-09 DIAGNOSIS — M329 Systemic lupus erythematosus, unspecified: Secondary | ICD-10-CM | POA: Diagnosis not present

## 2018-11-09 DIAGNOSIS — I639 Cerebral infarction, unspecified: Secondary | ICD-10-CM | POA: Diagnosis not present

## 2018-11-09 DIAGNOSIS — R2689 Other abnormalities of gait and mobility: Secondary | ICD-10-CM | POA: Diagnosis not present

## 2018-11-09 DIAGNOSIS — M109 Gout, unspecified: Secondary | ICD-10-CM | POA: Diagnosis not present

## 2018-11-09 DIAGNOSIS — M810 Age-related osteoporosis without current pathological fracture: Secondary | ICD-10-CM | POA: Diagnosis not present

## 2018-11-09 DIAGNOSIS — Z940 Kidney transplant status: Secondary | ICD-10-CM | POA: Diagnosis not present

## 2018-11-10 DIAGNOSIS — E118 Type 2 diabetes mellitus with unspecified complications: Secondary | ICD-10-CM | POA: Diagnosis not present

## 2018-11-10 DIAGNOSIS — I639 Cerebral infarction, unspecified: Secondary | ICD-10-CM | POA: Diagnosis not present

## 2018-11-10 DIAGNOSIS — M150 Primary generalized (osteo)arthritis: Secondary | ICD-10-CM | POA: Diagnosis not present

## 2018-11-11 DIAGNOSIS — I639 Cerebral infarction, unspecified: Secondary | ICD-10-CM | POA: Diagnosis not present

## 2018-11-11 DIAGNOSIS — M150 Primary generalized (osteo)arthritis: Secondary | ICD-10-CM | POA: Diagnosis not present

## 2018-11-11 DIAGNOSIS — E118 Type 2 diabetes mellitus with unspecified complications: Secondary | ICD-10-CM | POA: Diagnosis not present

## 2018-11-12 DIAGNOSIS — M150 Primary generalized (osteo)arthritis: Secondary | ICD-10-CM | POA: Diagnosis not present

## 2018-11-12 DIAGNOSIS — I639 Cerebral infarction, unspecified: Secondary | ICD-10-CM | POA: Diagnosis not present

## 2018-11-12 DIAGNOSIS — E118 Type 2 diabetes mellitus with unspecified complications: Secondary | ICD-10-CM | POA: Diagnosis not present

## 2018-11-13 ENCOUNTER — Other Ambulatory Visit: Payer: Self-pay | Admitting: Endocrinology

## 2018-11-13 DIAGNOSIS — M150 Primary generalized (osteo)arthritis: Secondary | ICD-10-CM | POA: Diagnosis not present

## 2018-11-13 DIAGNOSIS — E118 Type 2 diabetes mellitus with unspecified complications: Secondary | ICD-10-CM | POA: Diagnosis not present

## 2018-11-13 DIAGNOSIS — I639 Cerebral infarction, unspecified: Secondary | ICD-10-CM | POA: Diagnosis not present

## 2018-11-14 DIAGNOSIS — I639 Cerebral infarction, unspecified: Secondary | ICD-10-CM | POA: Diagnosis not present

## 2018-11-14 DIAGNOSIS — M150 Primary generalized (osteo)arthritis: Secondary | ICD-10-CM | POA: Diagnosis not present

## 2018-11-14 DIAGNOSIS — E118 Type 2 diabetes mellitus with unspecified complications: Secondary | ICD-10-CM | POA: Diagnosis not present

## 2018-11-15 DIAGNOSIS — E118 Type 2 diabetes mellitus with unspecified complications: Secondary | ICD-10-CM | POA: Diagnosis not present

## 2018-11-15 DIAGNOSIS — M150 Primary generalized (osteo)arthritis: Secondary | ICD-10-CM | POA: Diagnosis not present

## 2018-11-15 DIAGNOSIS — I639 Cerebral infarction, unspecified: Secondary | ICD-10-CM | POA: Diagnosis not present

## 2018-11-16 ENCOUNTER — Other Ambulatory Visit (INDEPENDENT_AMBULATORY_CARE_PROVIDER_SITE_OTHER): Payer: Medicare Other

## 2018-11-16 ENCOUNTER — Other Ambulatory Visit: Payer: Self-pay

## 2018-11-16 ENCOUNTER — Other Ambulatory Visit: Payer: Self-pay | Admitting: Family Medicine

## 2018-11-16 DIAGNOSIS — Z7901 Long term (current) use of anticoagulants: Secondary | ICD-10-CM | POA: Diagnosis not present

## 2018-11-16 DIAGNOSIS — E118 Type 2 diabetes mellitus with unspecified complications: Secondary | ICD-10-CM | POA: Diagnosis not present

## 2018-11-16 DIAGNOSIS — M150 Primary generalized (osteo)arthritis: Secondary | ICD-10-CM | POA: Diagnosis not present

## 2018-11-16 DIAGNOSIS — I639 Cerebral infarction, unspecified: Secondary | ICD-10-CM | POA: Diagnosis not present

## 2018-11-16 LAB — POCT INR: INR: 1.5 — AB (ref 2.0–3.0)

## 2018-11-17 DIAGNOSIS — I639 Cerebral infarction, unspecified: Secondary | ICD-10-CM | POA: Diagnosis not present

## 2018-11-17 DIAGNOSIS — M150 Primary generalized (osteo)arthritis: Secondary | ICD-10-CM | POA: Diagnosis not present

## 2018-11-17 DIAGNOSIS — E118 Type 2 diabetes mellitus with unspecified complications: Secondary | ICD-10-CM | POA: Diagnosis not present

## 2018-11-18 DIAGNOSIS — I639 Cerebral infarction, unspecified: Secondary | ICD-10-CM | POA: Diagnosis not present

## 2018-11-18 DIAGNOSIS — M150 Primary generalized (osteo)arthritis: Secondary | ICD-10-CM | POA: Diagnosis not present

## 2018-11-18 DIAGNOSIS — E118 Type 2 diabetes mellitus with unspecified complications: Secondary | ICD-10-CM | POA: Diagnosis not present

## 2018-11-19 DIAGNOSIS — E118 Type 2 diabetes mellitus with unspecified complications: Secondary | ICD-10-CM | POA: Diagnosis not present

## 2018-11-19 DIAGNOSIS — M150 Primary generalized (osteo)arthritis: Secondary | ICD-10-CM | POA: Diagnosis not present

## 2018-11-19 DIAGNOSIS — I639 Cerebral infarction, unspecified: Secondary | ICD-10-CM | POA: Diagnosis not present

## 2018-11-20 DIAGNOSIS — M150 Primary generalized (osteo)arthritis: Secondary | ICD-10-CM | POA: Diagnosis not present

## 2018-11-20 DIAGNOSIS — I639 Cerebral infarction, unspecified: Secondary | ICD-10-CM | POA: Diagnosis not present

## 2018-11-20 DIAGNOSIS — E118 Type 2 diabetes mellitus with unspecified complications: Secondary | ICD-10-CM | POA: Diagnosis not present

## 2018-11-21 DIAGNOSIS — M150 Primary generalized (osteo)arthritis: Secondary | ICD-10-CM | POA: Diagnosis not present

## 2018-11-21 DIAGNOSIS — E118 Type 2 diabetes mellitus with unspecified complications: Secondary | ICD-10-CM | POA: Diagnosis not present

## 2018-11-21 DIAGNOSIS — I639 Cerebral infarction, unspecified: Secondary | ICD-10-CM | POA: Diagnosis not present

## 2018-11-22 ENCOUNTER — Other Ambulatory Visit: Payer: Self-pay | Admitting: Endocrinology

## 2018-11-22 ENCOUNTER — Telehealth: Payer: Self-pay | Admitting: *Deleted

## 2018-11-22 ENCOUNTER — Other Ambulatory Visit: Payer: Self-pay | Admitting: Cardiovascular Disease

## 2018-11-22 ENCOUNTER — Ambulatory Visit: Payer: Medicare Other

## 2018-11-22 DIAGNOSIS — I639 Cerebral infarction, unspecified: Secondary | ICD-10-CM | POA: Diagnosis not present

## 2018-11-22 DIAGNOSIS — M150 Primary generalized (osteo)arthritis: Secondary | ICD-10-CM | POA: Diagnosis not present

## 2018-11-22 DIAGNOSIS — E118 Type 2 diabetes mellitus with unspecified complications: Secondary | ICD-10-CM | POA: Diagnosis not present

## 2018-11-22 NOTE — Telephone Encounter (Signed)
  Called pt as she is months overdue for her INR check. Pt states she has been capacitated and ask what she meant by that and she said in bad health, explained to her we haven't seen her since February and that hope her health is better so she can come to her appt. Pt unable to come to this location for any appts as we have have limited appts that do not meet her schedule and she is willing to go to [facility] so appt set and confirmed for the convenient time that was available. Pt aware that if anything changes she needs to contact [facility] regarding this.  Pt states she has enough medication until her appt.   1. COVID-19 Pre-Screening Questions:  . In the past 7 to 10 days have you had a cough,  shortness of breath, headache, congestion, fever (100 or greater) body aches, chills, sore throat, or sudden loss of taste or sense of smell? No . Have you been around anyone with known Covid 19. No . Have you been around anyone who is awaiting Covid 19 test results in the past 7 to 10 days? No . Have you been around anyone who has been exposed to Covid 19, or has mentioned symptoms of Covid 19 within the past 7 to 10 days? No   2. Pt advised of visitor restrictions (no visitors allowed except if needed to conduct the visit). Also advised to arrive at appointment time and wear a mask.

## 2018-11-22 NOTE — Telephone Encounter (Signed)
Called pt as she has a refill request for her Warfarin/Coumadin, as she is months overdue for her INR check. Pt states she has been capacitated and ask what she meant by that and she said in bad health, explained to her we haven't seen her since February and that hope her health is better so she can come to her appt. Offered pt many appt time that are available at this location and none of them fit her needs. Since pt pt unable to come to this location for any appts  and since we have limited appts that do not meet her schedule and she is willing to go to [facility] so appt set and confirmed for the convenient time that was available. Assessed the [place] location where her Cardiologist is and she was happy with the appt time that was available. Pt aware that if anything changes she needs to contact [facility] regarding this.  Pt states she has enough medication until her appt and noted she needs a refill on appt note.   1. COVID-19 Pre-Screening Questions:  . In the past 7 to 10 days have you had a cough,  shortness of breath, headache, congestion, fever (100 or greater) body aches, chills, sore throat, or sudden loss of taste or sense of smell? No . Have you been around anyone with known Covid 19. No . Have you been around anyone who is awaiting Covid 19 test results in the past 7 to 10 days? No . Have you been around anyone who has been exposed to Covid 19, or has mentioned symptoms of Covid 19 within the past 7 to 10 days? No   2. Pt advised of visitor restrictions (no visitors allowed except if needed to conduct the visit). Also advised to arrive at appointment time and wear a mask.

## 2018-11-28 ENCOUNTER — Other Ambulatory Visit: Payer: Self-pay

## 2018-11-28 NOTE — Patient Outreach (Addendum)
[facility]) Care Management  11/28/2018  Connie Ruiz February 16, 1961 [id number]    Telephone Screen Referral Date : 11/28/2018 Referral Source: [facility] High Risk Referral Reason: Screening Insurance: [facility]   Outreach attempt # to patient. HIPAA was verified by the patient. She states that she is doing ok.  She was able to complete the screening.   Social:  The patient lives in the home alone. She states that she has an aid to come in to help an her daughter stays close and helps as well.  She states that she is independent/assist with her ADLS.  She is assist with her IADLS,  She states that her daughter takes her to her appointments. She denies any falls.  The patient states the equipment she has in the  home are: CBG meter, eye glasses, scales, walker,cane, and shower chair with back.  Conditions:Per chart review the patient's conditions include: DM Type II, HTN, S/p kidney transplant, HX PE/DVT/CVA with residual deficit, Post surgical Hypothyroidism and GERD. The patient states that she does not monitor her blood pressure she does not have a cuff.  .Medications: Per the patient she is on twelve medications.  She states that she does not need any help with her medications.  Her daughter handles her medications.  She states she does have some side effect at times from her medications.  When offered for her to talk with pharmacy the patient declined.   Consent/ Services: Verbal consent given by the patient.  Discussed and offered [facility] care management services with patient. Patient verbally agreed to services.     Plan: Patient agrees to have a Engineer, maintenance.  RN Health Coach will send the patient welcome letter and Health Coach letter. RN Health Coach will outreach the patient within thirty days.  Lazaro Arms RN, BSN, [place] Direct Dial:  [phone] Fax: [phone]

## 2018-11-29 ENCOUNTER — Telehealth: Payer: Self-pay | Admitting: Family Medicine

## 2018-11-29 ENCOUNTER — Other Ambulatory Visit: Payer: Self-pay

## 2018-11-29 DIAGNOSIS — I10 Essential (primary) hypertension: Secondary | ICD-10-CM | POA: Diagnosis not present

## 2018-11-29 DIAGNOSIS — Z940 Kidney transplant status: Secondary | ICD-10-CM | POA: Diagnosis not present

## 2018-11-29 DIAGNOSIS — E139 Other specified diabetes mellitus without complications: Secondary | ICD-10-CM | POA: Diagnosis not present

## 2018-11-29 DIAGNOSIS — M109 Gout, unspecified: Secondary | ICD-10-CM | POA: Diagnosis not present

## 2018-11-29 DIAGNOSIS — M329 Systemic lupus erythematosus, unspecified: Secondary | ICD-10-CM | POA: Diagnosis not present

## 2018-11-29 NOTE — Telephone Encounter (Signed)
Medication Refill - Medication: warfarin (COUMADIN) 5 MG tablet    Has the patient contacted their pharmacy? Yes.   (Agent: If no, request that the patient contact the pharmacy for the refill.) (Agent: If yes, when and what did the pharmacy advise?)  Preferred Pharmacy (with phone number or street name): [id number] - [place], [address]: Please be advised that RX refills may take up to 3 business days. We ask that you follow-up with your pharmacy.

## 2018-11-30 ENCOUNTER — Telehealth: Payer: Self-pay

## 2018-11-30 ENCOUNTER — Other Ambulatory Visit: Payer: Self-pay | Admitting: Family Medicine

## 2018-11-30 DIAGNOSIS — Z4822 Encounter for aftercare following kidney transplant: Secondary | ICD-10-CM | POA: Diagnosis not present

## 2018-11-30 DIAGNOSIS — R109 Unspecified abdominal pain: Secondary | ICD-10-CM | POA: Diagnosis not present

## 2018-11-30 MED ORDER — WARFARIN SODIUM 5 MG PO TABS: 2.5000 mg | ORAL_TABLET | ORAL | 2 refills | Status: DC

## 2018-11-30 NOTE — Telephone Encounter (Signed)
Rx for Coumadin sent. Remind her to arrange appt with coumadin clinic. I have a form pending to complete,can you please fill out what you can (med list and Dx's).  Thanks, BJ

## 2018-11-30 NOTE — Telephone Encounter (Signed)
Message sent to Dr. Jordan for review and approval. 

## 2018-11-30 NOTE — Telephone Encounter (Signed)
Pt's daughter, Connie Ruiz ([place]), called regarding FL2 and FMLA forms she had faxed to the office on 11/13/18. She states she has not heard from anyone about these forms and they are due today. She mentioned they may have been given to [place] (not sure why) and then sent to [name].   JoAnne/Q - Please advise on forms, please call pt's daughter with update. Not sure who has them so message sent to both CMAs.   [name] [phone]

## 2018-12-01 NOTE — Telephone Encounter (Signed)
Form placed on providers desk for completion

## 2018-12-01 NOTE — Telephone Encounter (Signed)
[employer] called in stating that the patients daughter was on the line asking to come and pick up the forms. I informed the [facility] agent that Dr. [name] has been out of the office and that she will return on Monday. She then stated that the patient's daughter told her the forms were due yesterday and that she was on her way to the office to pick them up and that she seemed very upset.  The patient disconnected the call before she was transferred for me to speak with her.

## 2018-12-01 NOTE — Telephone Encounter (Signed)
FL2 form completed by RN and faxed over to [facility].

## 2018-12-01 NOTE — Telephone Encounter (Signed)
Form is on provider's desk for completion.

## 2018-12-01 NOTE — Telephone Encounter (Signed)
Connie Ruiz was working with Dr. [name] last week when Daisy Lazar was out of the office.  Daisy Lazar is Dr. Doug Sou nurse. I just spoke with Daisy Lazar, she stated that the [facility] form is on Dr. Doug Sou desk and Dr. [name] will be back in the office on Monday.

## 2018-12-01 NOTE — Telephone Encounter (Signed)
Form completed by Roselyn Reef, RN and faxed to [facility].

## 2018-12-04 ENCOUNTER — Other Ambulatory Visit: Payer: Self-pay | Admitting: Family Medicine

## 2018-12-04 ENCOUNTER — Other Ambulatory Visit: Payer: Medicare Other

## 2018-12-04 DIAGNOSIS — E890 Postprocedural hypothyroidism: Secondary | ICD-10-CM

## 2018-12-04 DIAGNOSIS — I10 Essential (primary) hypertension: Secondary | ICD-10-CM

## 2018-12-04 DIAGNOSIS — E876 Hypokalemia: Secondary | ICD-10-CM

## 2018-12-07 DIAGNOSIS — M150 Primary generalized (osteo)arthritis: Secondary | ICD-10-CM | POA: Diagnosis not present

## 2018-12-07 DIAGNOSIS — I639 Cerebral infarction, unspecified: Secondary | ICD-10-CM | POA: Diagnosis not present

## 2018-12-07 DIAGNOSIS — E118 Type 2 diabetes mellitus with unspecified complications: Secondary | ICD-10-CM | POA: Diagnosis not present

## 2018-12-08 DIAGNOSIS — M150 Primary generalized (osteo)arthritis: Secondary | ICD-10-CM | POA: Diagnosis not present

## 2018-12-08 DIAGNOSIS — E118 Type 2 diabetes mellitus with unspecified complications: Secondary | ICD-10-CM | POA: Diagnosis not present

## 2018-12-08 DIAGNOSIS — I639 Cerebral infarction, unspecified: Secondary | ICD-10-CM | POA: Diagnosis not present

## 2018-12-09 DIAGNOSIS — I639 Cerebral infarction, unspecified: Secondary | ICD-10-CM | POA: Diagnosis not present

## 2018-12-09 DIAGNOSIS — E118 Type 2 diabetes mellitus with unspecified complications: Secondary | ICD-10-CM | POA: Diagnosis not present

## 2018-12-09 DIAGNOSIS — M150 Primary generalized (osteo)arthritis: Secondary | ICD-10-CM | POA: Diagnosis not present

## 2018-12-10 DIAGNOSIS — E118 Type 2 diabetes mellitus with unspecified complications: Secondary | ICD-10-CM | POA: Diagnosis not present

## 2018-12-10 DIAGNOSIS — I639 Cerebral infarction, unspecified: Secondary | ICD-10-CM | POA: Diagnosis not present

## 2018-12-10 DIAGNOSIS — M150 Primary generalized (osteo)arthritis: Secondary | ICD-10-CM | POA: Diagnosis not present

## 2018-12-11 DIAGNOSIS — M150 Primary generalized (osteo)arthritis: Secondary | ICD-10-CM | POA: Diagnosis not present

## 2018-12-11 DIAGNOSIS — I639 Cerebral infarction, unspecified: Secondary | ICD-10-CM | POA: Diagnosis not present

## 2018-12-11 DIAGNOSIS — E118 Type 2 diabetes mellitus with unspecified complications: Secondary | ICD-10-CM | POA: Diagnosis not present

## 2018-12-12 DIAGNOSIS — M150 Primary generalized (osteo)arthritis: Secondary | ICD-10-CM | POA: Diagnosis not present

## 2018-12-12 DIAGNOSIS — I639 Cerebral infarction, unspecified: Secondary | ICD-10-CM | POA: Diagnosis not present

## 2018-12-12 DIAGNOSIS — E118 Type 2 diabetes mellitus with unspecified complications: Secondary | ICD-10-CM | POA: Diagnosis not present

## 2018-12-12 NOTE — Telephone Encounter (Signed)
Hold for 7/10 appt

## 2018-12-13 ENCOUNTER — Other Ambulatory Visit: Payer: Self-pay

## 2018-12-13 DIAGNOSIS — M150 Primary generalized (osteo)arthritis: Secondary | ICD-10-CM | POA: Diagnosis not present

## 2018-12-13 DIAGNOSIS — I639 Cerebral infarction, unspecified: Secondary | ICD-10-CM | POA: Diagnosis not present

## 2018-12-13 DIAGNOSIS — E118 Type 2 diabetes mellitus with unspecified complications: Secondary | ICD-10-CM | POA: Diagnosis not present

## 2018-12-13 NOTE — Patient Outreach (Signed)
[facility]) Care Management  12/13/2018  AVNEET ASHMORE [birth year] [id number]    RN Health Coach made 1st attempt for initial assessment.  HIPAA verified by the patient.  The patient states that she did receive the welcome and Health Coach letter.  She said that she is not feeling well this morning her lupus had her body hurting all over.  She rated the pain at a 8/10.  She states  she has medication that helps with the pain.  She asked if I could call her at another time.  Plan: RN Health Coach will make another outreach attempt to the patient within thirty business days.   Lazaro Arms RN, BSN, [place] Direct Dial:  [phone]  Fax: [phone]

## 2018-12-14 DIAGNOSIS — E118 Type 2 diabetes mellitus with unspecified complications: Secondary | ICD-10-CM | POA: Diagnosis not present

## 2018-12-14 DIAGNOSIS — M150 Primary generalized (osteo)arthritis: Secondary | ICD-10-CM | POA: Diagnosis not present

## 2018-12-14 DIAGNOSIS — I639 Cerebral infarction, unspecified: Secondary | ICD-10-CM | POA: Diagnosis not present

## 2018-12-15 DIAGNOSIS — E118 Type 2 diabetes mellitus with unspecified complications: Secondary | ICD-10-CM | POA: Diagnosis not present

## 2018-12-15 DIAGNOSIS — M150 Primary generalized (osteo)arthritis: Secondary | ICD-10-CM | POA: Diagnosis not present

## 2018-12-15 DIAGNOSIS — I639 Cerebral infarction, unspecified: Secondary | ICD-10-CM | POA: Diagnosis not present

## 2018-12-16 DIAGNOSIS — E118 Type 2 diabetes mellitus with unspecified complications: Secondary | ICD-10-CM | POA: Diagnosis not present

## 2018-12-16 DIAGNOSIS — I639 Cerebral infarction, unspecified: Secondary | ICD-10-CM | POA: Diagnosis not present

## 2018-12-16 DIAGNOSIS — M150 Primary generalized (osteo)arthritis: Secondary | ICD-10-CM | POA: Diagnosis not present

## 2018-12-17 DIAGNOSIS — E118 Type 2 diabetes mellitus with unspecified complications: Secondary | ICD-10-CM | POA: Diagnosis not present

## 2018-12-17 DIAGNOSIS — M150 Primary generalized (osteo)arthritis: Secondary | ICD-10-CM | POA: Diagnosis not present

## 2018-12-17 DIAGNOSIS — I639 Cerebral infarction, unspecified: Secondary | ICD-10-CM | POA: Diagnosis not present

## 2018-12-18 DIAGNOSIS — M150 Primary generalized (osteo)arthritis: Secondary | ICD-10-CM | POA: Diagnosis not present

## 2018-12-18 DIAGNOSIS — I639 Cerebral infarction, unspecified: Secondary | ICD-10-CM | POA: Diagnosis not present

## 2018-12-18 DIAGNOSIS — E118 Type 2 diabetes mellitus with unspecified complications: Secondary | ICD-10-CM | POA: Diagnosis not present

## 2018-12-18 NOTE — Telephone Encounter (Signed)
Appt moved to 7/15 - will hold refill until pt shows for f/u appt since overdue

## 2018-12-19 DIAGNOSIS — I639 Cerebral infarction, unspecified: Secondary | ICD-10-CM | POA: Diagnosis not present

## 2018-12-19 DIAGNOSIS — E118 Type 2 diabetes mellitus with unspecified complications: Secondary | ICD-10-CM | POA: Diagnosis not present

## 2018-12-19 DIAGNOSIS — M150 Primary generalized (osteo)arthritis: Secondary | ICD-10-CM | POA: Diagnosis not present

## 2018-12-20 ENCOUNTER — Ambulatory Visit (INDEPENDENT_AMBULATORY_CARE_PROVIDER_SITE_OTHER): Payer: Medicare Other | Admitting: Pharmacist

## 2018-12-20 ENCOUNTER — Other Ambulatory Visit: Payer: Self-pay | Admitting: Pharmacist

## 2018-12-20 ENCOUNTER — Ambulatory Visit: Payer: Medicare Other | Admitting: Obstetrics and Gynecology

## 2018-12-20 ENCOUNTER — Other Ambulatory Visit: Payer: Self-pay

## 2018-12-20 DIAGNOSIS — Z7901 Long term (current) use of anticoagulants: Secondary | ICD-10-CM | POA: Diagnosis not present

## 2018-12-20 DIAGNOSIS — I2699 Other pulmonary embolism without acute cor pulmonale: Secondary | ICD-10-CM

## 2018-12-20 DIAGNOSIS — I6350 Cerebral infarction due to unspecified occlusion or stenosis of unspecified cerebral artery: Secondary | ICD-10-CM

## 2018-12-20 DIAGNOSIS — E118 Type 2 diabetes mellitus with unspecified complications: Secondary | ICD-10-CM | POA: Diagnosis not present

## 2018-12-20 DIAGNOSIS — I639 Cerebral infarction, unspecified: Secondary | ICD-10-CM | POA: Diagnosis not present

## 2018-12-20 DIAGNOSIS — M150 Primary generalized (osteo)arthritis: Secondary | ICD-10-CM | POA: Diagnosis not present

## 2018-12-20 LAB — POCT INR: INR: 3.1 — AB (ref 2.0–3.0)

## 2018-12-20 MED ORDER — WARFARIN SODIUM 5 MG PO TABS: 2.5000 mg | ORAL_TABLET | ORAL | 0 refills | Status: DC

## 2018-12-21 DIAGNOSIS — I639 Cerebral infarction, unspecified: Secondary | ICD-10-CM | POA: Diagnosis not present

## 2018-12-21 DIAGNOSIS — E118 Type 2 diabetes mellitus with unspecified complications: Secondary | ICD-10-CM | POA: Diagnosis not present

## 2018-12-21 DIAGNOSIS — M150 Primary generalized (osteo)arthritis: Secondary | ICD-10-CM | POA: Diagnosis not present

## 2018-12-22 DIAGNOSIS — E118 Type 2 diabetes mellitus with unspecified complications: Secondary | ICD-10-CM | POA: Diagnosis not present

## 2018-12-22 DIAGNOSIS — I639 Cerebral infarction, unspecified: Secondary | ICD-10-CM | POA: Diagnosis not present

## 2018-12-22 DIAGNOSIS — M150 Primary generalized (osteo)arthritis: Secondary | ICD-10-CM | POA: Diagnosis not present

## 2018-12-23 DIAGNOSIS — I639 Cerebral infarction, unspecified: Secondary | ICD-10-CM | POA: Diagnosis not present

## 2018-12-23 DIAGNOSIS — M150 Primary generalized (osteo)arthritis: Secondary | ICD-10-CM | POA: Diagnosis not present

## 2018-12-23 DIAGNOSIS — E118 Type 2 diabetes mellitus with unspecified complications: Secondary | ICD-10-CM | POA: Diagnosis not present

## 2018-12-24 DIAGNOSIS — M150 Primary generalized (osteo)arthritis: Secondary | ICD-10-CM | POA: Diagnosis not present

## 2018-12-24 DIAGNOSIS — I639 Cerebral infarction, unspecified: Secondary | ICD-10-CM | POA: Diagnosis not present

## 2018-12-24 DIAGNOSIS — E118 Type 2 diabetes mellitus with unspecified complications: Secondary | ICD-10-CM | POA: Diagnosis not present

## 2018-12-25 DIAGNOSIS — I639 Cerebral infarction, unspecified: Secondary | ICD-10-CM | POA: Diagnosis not present

## 2018-12-25 DIAGNOSIS — M150 Primary generalized (osteo)arthritis: Secondary | ICD-10-CM | POA: Diagnosis not present

## 2018-12-25 DIAGNOSIS — E118 Type 2 diabetes mellitus with unspecified complications: Secondary | ICD-10-CM | POA: Diagnosis not present

## 2018-12-26 DIAGNOSIS — E118 Type 2 diabetes mellitus with unspecified complications: Secondary | ICD-10-CM | POA: Diagnosis not present

## 2018-12-26 DIAGNOSIS — I639 Cerebral infarction, unspecified: Secondary | ICD-10-CM | POA: Diagnosis not present

## 2018-12-26 DIAGNOSIS — M150 Primary generalized (osteo)arthritis: Secondary | ICD-10-CM | POA: Diagnosis not present

## 2018-12-27 DIAGNOSIS — M150 Primary generalized (osteo)arthritis: Secondary | ICD-10-CM | POA: Diagnosis not present

## 2018-12-27 DIAGNOSIS — I639 Cerebral infarction, unspecified: Secondary | ICD-10-CM | POA: Diagnosis not present

## 2018-12-27 DIAGNOSIS — E118 Type 2 diabetes mellitus with unspecified complications: Secondary | ICD-10-CM | POA: Diagnosis not present

## 2018-12-28 ENCOUNTER — Ambulatory Visit (INDEPENDENT_AMBULATORY_CARE_PROVIDER_SITE_OTHER): Payer: Medicare Other | Admitting: Obstetrics and Gynecology

## 2018-12-28 ENCOUNTER — Encounter: Payer: Self-pay | Admitting: Obstetrics and Gynecology

## 2018-12-28 ENCOUNTER — Other Ambulatory Visit: Payer: Self-pay

## 2018-12-28 VITALS — BP 114/86 | HR 72 | Temp 97.8°F | Ht 62.6 in | Wt 175.0 lb

## 2018-12-28 DIAGNOSIS — E118 Type 2 diabetes mellitus with unspecified complications: Secondary | ICD-10-CM | POA: Diagnosis not present

## 2018-12-28 DIAGNOSIS — Z8739 Personal history of other diseases of the musculoskeletal system and connective tissue: Secondary | ICD-10-CM

## 2018-12-28 DIAGNOSIS — I639 Cerebral infarction, unspecified: Secondary | ICD-10-CM | POA: Diagnosis not present

## 2018-12-28 DIAGNOSIS — M150 Primary generalized (osteo)arthritis: Secondary | ICD-10-CM | POA: Diagnosis not present

## 2018-12-28 DIAGNOSIS — N63 Unspecified lump in unspecified breast: Secondary | ICD-10-CM | POA: Diagnosis not present

## 2018-12-28 DIAGNOSIS — R32 Unspecified urinary incontinence: Secondary | ICD-10-CM | POA: Diagnosis not present

## 2018-12-28 DIAGNOSIS — Z01419 Encounter for gynecological examination (general) (routine) without abnormal findings: Secondary | ICD-10-CM | POA: Diagnosis not present

## 2018-12-28 NOTE — Progress Notes (Signed)
58 y.o. G28P1001 Divorced Black or [place] American Not Hispanic or Latino female here for annual exam.    She has mixed incontinence, urge>stress, not helped with ditropan. Doesn't want to increase her dose. Tried PT in the past, doesn't want to go back. She leaks ~5 x a day. She wears depends, changes them 4-5 x a day.   BM are okay.   Not sexually active.     No LMP recorded. Patient is postmenopausal.          Sexually active: No.  The current method of family planning is post menopausal status.    Exercising: No.  The patient does not participate in regular exercise at present. Smoker:  no  Health Maintenance: Pap: 09/06/2017 WNL NEG HPV History of abnormal Pap:  no MMG:  09/12/2017 Birads 2 benign Colonoscopy:  2018 normal  BMD:   09-26-16 Osteoporosis, will f/u with Dr [name] TDaP:  03-26-09   Gardasil: N/A   reports that she has never smoked. She has never used smokeless tobacco. She reports that she does not drink alcohol or use drugs. She is on disability. One daughter, local.   Past Medical History:  Diagnosis Date  . Anxiety   . Candida esophagitis ([place]) 11/12/2014  . CEREBROVASCULAR ACCIDENT, ACUTE 04/15/2010  . CLOSTRIDIUM DIFFICILE COLITIS, HX OF 08/21/2007  . CONGESTIVE HEART FAILURE 08/21/2007  . Current use of long term anticoagulation    Dr. Andree Elk, [facility]  . CVA 04/17/2010  . Depression    Dr. Andree Elk, [place], TYPE II 08/21/2007  . DVT, HX OF 08/21/2007  . GERD 08/21/2007  . GOUT 08/21/2007  . History of stroke with residual effects   . HYPERLIPIDEMIA 08/21/2007  . HYPERTENSION 08/21/2007   Dr. Andree Elk, [place], HX OF 08/22/2007   s/p renal transplant-Dr. Andree Elk, [place] 08/21/2007  . OSTEOPOROSIS 08/21/2007   Rheumatol at [facility]  . Pulmonary embolism ([place]) 07/16/2010  . Renal failure   . RENAL INSUFFICIENCY 08/21/2007  . Right sided weakness   . Steroid-induced hyperglycemia 11/09/2014  . Tachycardia   . THYROID  NODULE, LEFT 04/10/2009    Past Surgical History:  Procedure Laterality Date  . CESAREAN SECTION    . CHOLECYSTECTOMY    . ENTEROSCOPY N/A 11/11/2014   Procedure: ENTEROSCOPY;  Surgeon: Ladene Artist, MD;  Location: [facility];  Service: Endoscopy;  Laterality: N/A;  . KIDNEY TRANSPLANT Right 2009  . RENAL BIOPSY, OPEN  1981  . TUBAL LIGATION      Current Outpatient Medications  Medication Sig Dispense Refill  . acetaminophen (TYLENOL) 500 MG tablet Take 1,000 mg by mouth daily as needed for mild pain.    Marland Kitchen alendronate (FOSAMAX) 70 MG tablet TAKE 1 TABLET BY MOUTH ONCE A WEEK ON AN EMPTY STOMACH ON SATURDAYS WITH A FULL GLASS OF WATER. (Patient taking differently: Take 70 mg by mouth once a week. ) 12 tablet 0  . atorvastatin (LIPITOR) 40 MG tablet Take 40 mg by mouth daily at 6 PM.     . Blood Glucose Monitoring Suppl (ACCU-CHEK AVIVA PLUS) w/Device KIT 1 Device by Does not apply route daily. 1 kit 1  . calcitRIOL (ROCALTROL) 0.25 MCG capsule TAKE 1 CAPSULE(0.25 MCG) BY MOUTH DAILY (Patient taking differently: Take 0.25 mcg by mouth every other day. ) 30 capsule 0  . diltiazem (TIAZAC) 360 MG 24 hr capsule Take 1 capsule (360 mg total) by mouth daily. 90 capsule 1  .  donepezil (ARICEPT) 10 MG tablet Take 10 mg by mouth at bedtime.     Marland Kitchen esomeprazole (NEXIUM) 20 MG capsule TAKE 1 CAPSULE(20 MG) BY MOUTH DAILY (Patient taking differently: Take 20 mg by mouth daily at 12 noon. ) 30 capsule 11  . feeding supplement, ENSURE ENLIVE, (ENSURE ENLIVE) LIQD Take 237 mLs by mouth 2 (two) times daily between meals.    Marland Kitchen glucose blood (ONE TOUCH ULTRA TEST) test strip Use to check blood sugar 1 time per day 100 each 2  . levothyroxine (SYNTHROID) 175 MCG tablet Take 1 tablet (175 mcg total) by mouth daily before breakfast. 30 tablet 2  . losartan (COZAAR) 100 MG tablet TAKE 1 TABLET(100 MG) BY MOUTH DAILY (Patient taking differently: Take 100 mg by mouth daily. ) 30 tablet 2  . magnesium oxide  (MAG-OX) 400 MG tablet Take 800 mg by mouth daily.     . metFORMIN (GLUCOPHAGE-XR) 500 MG 24 hr tablet TAKE 1 TABLET(500 MG) BY MOUTH DAILY WITH BREAKFAST 90 tablet 0  . mirtazapine (REMERON) 30 MG tablet Take 1 tablet (30 mg total) by mouth at bedtime. 30 tablet 1  . mycophenolate (CELLCEPT) 250 MG capsule TAKE ONE CAPSULE BY MOUTH TWICE DAILY    . nystatin (MYCOSTATIN) powder Apply 1 g topically 4 (four) times daily as needed. For yeast under breast    . ondansetron (ZOFRAN ODT) 4 MG disintegrating tablet 59m ODT q4 hours prn nausea/vomiting 15 tablet 0  . ONETOUCH DELICA LANCETS 341LMISC Use to check blood sugar 1 time per day 100 each 2  . oxybutynin (DITROPAN) 5 MG tablet Take 5 mg by mouth daily as needed for bladder spasms.     . Potassium Chloride ER 20 MEQ TBCR Take 20 mEq by mouth daily. 90 tablet 3  . predniSONE (DELTASONE) 5 MG tablet Take 5 mg by mouth daily with breakfast.     . QUEtiapine (SEROQUEL) 25 MG tablet Take 1 tablet (25 mg total) by mouth at bedtime. 30 tablet 1  . sertraline (ZOLOFT) 100 MG tablet Take 0.5 tablets (50 mg total) by mouth daily. 15 tablet 2  . tacrolimus (PROGRAF) 1 MG capsule Take 1-2 mg by mouth See admin instructions. 2 mg in the am and 1 mg at night    . warfarin (COUMADIN) 5 MG tablet Take 0.5-1 tablets (2.5-5 mg total) by mouth See admin instructions. 50 tablet 0  . sucralfate (CARAFATE) 1 g tablet Take 1 tablet (1 g total) by mouth 4 (four) times daily for 30 days.     No current facility-administered medications for this visit.     Family History  Problem Relation Age of Onset  . Heart attack Mother   . Heart disease Father   . Asthma Sister   . Asthma Sister   . Asthma Daughter   . Cancer Maternal Grandfather        prostate  . Cancer Paternal Grandfather        colon    Review of Systems  Constitutional: Negative.   HENT: Negative.   Eyes: Negative.   Respiratory: Negative.   Cardiovascular: Negative.   Gastrointestinal:  Negative.   Endocrine: Negative.   Genitourinary: Negative.   Musculoskeletal: Negative.   Skin: Negative.   Allergic/Immunologic: Negative.   Neurological: Negative.   Psychiatric/Behavioral: Negative.     Exam:   BP 114/86 (BP Location: Left Arm, Patient Position: Sitting, Cuff Size: Normal)   Pulse 72   Temp 97.8 F (36.6 C) (Skin)  Ht 5' 2.6" (1.59 m)   Wt 175 lb (79.4 kg)   BMI 31.40 kg/m   Weight change: _0 @ Height:   Height: 5' 2.6" (159 cm)  Ht Readings from Last 3 Encounters:  12/28/18 5' 2.6" (1.59 m)  08/14/18 5' 4" (1.626 m)  04/14/18 5' 4" (1.626 m)    General appearance: alert, cooperative and appears stated age Head: Normocephalic, without obvious abnormality, atraumatic Neck: no adenopathy, supple, symmetrical, trachea midline and thyroid normal to inspection and palpation Lungs: clear to auscultation bilaterally Cardiovascular: regular rate and rhythm Breasts: 2 pea sized lumps in her right breast at 12 and 1 pea sized lump at 3 o'clock, stable. Breasts are pedulous, no changes.  Abdomen: soft, non-tender; non distended,  no masses,  no organomegaly Extremities: extremities normal, atraumatic, no cyanosis or edema Skin: Skin color, texture, turgor normal. No rashes or lesions Lymph nodes: Cervical, supraclavicular, and axillary nodes normal. No abnormal inguinal nodes palpated Neurologic: Grossly normal   Pelvic: External genitalia:  no lesions              Urethra:  normal appearing urethra with no masses, tenderness or lesions              Bartholins and Skenes: normal                 Vagina: normal appearing vagina with normal color and discharge, no lesions              Cervix: no lesions               Bimanual Exam:  Uterus:  normal size, contour, position, consistency, mobility, non-tender              Adnexa: no mass, fullness, tenderness               Rectovaginal: Confirms               Anus:  normal sphincter tone, no  lesions  Chaperone was present for exam.  A:  Well Woman with normal exam  Osteoporosis, on medication  Multiple medical problems, including: CVA, heart disease, DM, elevated lipids, s/p renal transplant.   Stable right breast lumps, benign on imaging  Urinary incontinence, on ditropan, declines an increase in dose  P:   Labs with primary  TDAP due in 10/20, she will get with her primary  DEXA due with primary  Mammogram due, she will schedule  Colonoscopy UTD  Discussed breast self exam  Discussed calcium and vit D intake  Declines PT for incontinence

## 2018-12-28 NOTE — Patient Instructions (Signed)
EXERCISE AND DIET:  We recommended that you start or continue a regular exercise program for good health. Regular exercise means any activity that makes your heart beat faster and makes you sweat.  We recommend exercising at least 30 minutes per day at least 3 days a week, preferably 4 or 5.  We also recommend a diet low in fat and sugar.  Inactivity, poor dietary choices and obesity can cause diabetes, heart attack, stroke, and kidney damage, among others.   ° °ALCOHOL AND SMOKING:  Women should limit their alcohol intake to no more than 7 drinks/beers/glasses of wine (combined, not each!) per week. Moderation of alcohol intake to this level decreases your risk of breast cancer and liver damage. And of course, no recreational drugs are part of a healthy lifestyle.  And absolutely no smoking or even second hand smoke. Most people know smoking can cause heart and lung diseases, but did you know it also contributes to weakening of your bones? Aging of your skin?  Yellowing of your teeth and nails? ° °CALCIUM AND VITAMIN D:  Adequate intake of calcium and Vitamin D are recommended.  The recommendations for exact amounts of these supplements seem to change often, but generally speaking 1,200 mg of calcium (between diet and supplement) and 800 units of Vitamin D per day seems prudent. Certain women may benefit from higher intake of Vitamin D.  If you are among these women, your doctor will have told you during your visit.   ° °PAP SMEARS:  Pap smears, to check for cervical cancer or precancers,  have traditionally been done yearly, although recent scientific advances have shown that most women can have pap smears less often.  However, every woman still should have a physical exam from her gynecologist every year. It will include a breast check, inspection of the vulva and vagina to check for abnormal growths or skin changes, a visual exam of the cervix, and then an exam to evaluate the size and shape of the uterus and  ovaries.  And after 58 years of age, a rectal exam is indicated to check for rectal cancers. We will also provide age appropriate advice regarding health maintenance, like when you should have certain vaccines, screening for sexually transmitted diseases, bone density testing, colonoscopy, mammograms, etc.  ° °MAMMOGRAMS:  All women over 40 years old should have a yearly mammogram. Many facilities now offer a "3D" mammogram, which may cost around $50 extra out of pocket. If possible,  we recommend you accept the option to have the 3D mammogram performed.  It both reduces the number of women who will be called back for extra views which then turn out to be normal, and it is better than the routine mammogram at detecting truly abnormal areas.   ° °COLON CANCER SCREENING: Now recommend starting at age 45. At this time colonoscopy is not covered for routine screening until 50. There are take home tests that can be done between 45-49.  ° °COLONOSCOPY:  Colonoscopy to screen for colon cancer is recommended for all women at age 50.  We know, you hate the idea of the prep.  We agree, BUT, having colon cancer and not knowing it is worse!!  Colon cancer so often starts as a polyp that can be seen and removed at colonscopy, which can quite literally save your life!  And if your first colonoscopy is normal and you have no family history of colon cancer, most women don't have to have it again for   10 years.  Once every ten years, you can do something that may end up saving your life, right?  We will be happy to help you get it scheduled when you are ready.  Be sure to check your insurance coverage so you understand how much it will cost.  It may be covered as a preventative service at no cost, but you should check your particular policy.   ° ° ° °Breast Self-Awareness °Breast self-awareness means being familiar with how your breasts look and feel. It involves checking your breasts regularly and reporting any changes to your  health care provider. °Practicing breast self-awareness is important. A change in your breasts can be a sign of a serious medical problem. Being familiar with how your breasts look and feel allows you to find any problems early, when treatment is more likely to be successful. All women should practice breast self-awareness, including women who have had breast implants. °How to do a breast self-exam °One way to learn what is normal for your breasts and whether your breasts are changing is to do a breast self-exam. To do a breast self-exam: °Look for Changes ° °1. Remove all the clothing above your waist. °2. Stand in front of a mirror in a room with good lighting. °3. Put your hands on your hips. °4. Push your hands firmly downward. °5. Compare your breasts in the mirror. Look for differences between them (asymmetry), such as: °? Differences in shape. °? Differences in size. °? Puckers, dips, and bumps in one breast and not the other. °6. Look at each breast for changes in your skin, such as: °? Redness. °? Scaly areas. °7. Look for changes in your nipples, such as: °? Discharge. °? Bleeding. °? Dimpling. °? Redness. °? A change in position. °Feel for Changes °Carefully feel your breasts for lumps and changes. It is best to do this while lying on your back on the floor and again while sitting or standing in the shower or tub with soapy water on your skin. Feel each breast in the following way: °· Place the arm on the side of the breast you are examining above your head. °· Feel your breast with the other hand. °· Start in the nipple area and make ¾ inch (2 cm) overlapping circles to feel your breast. Use the pads of your three middle fingers to do this. Apply light pressure, then medium pressure, then firm pressure. The light pressure will allow you to feel the tissue closest to the skin. The medium pressure will allow you to feel the tissue that is a little deeper. The firm pressure will allow you to feel the tissue  close to the ribs. °· Continue the overlapping circles, moving downward over the breast until you feel your ribs below your breast. °· Move one finger-width toward the center of the body. Continue to use the ¾ inch (2 cm) overlapping circles to feel your breast as you move slowly up toward your collarbone. °· Continue the up and down exam using all three pressures until you reach your armpit. ° °Write Down What You Find ° °Write down what is normal for each breast and any changes that you find. Keep a written record with breast changes or normal findings for each breast. By writing this information down, you do not need to depend only on memory for size, tenderness, or location. Write down where you are in your menstrual cycle, if you are still menstruating. °If you are having trouble noticing differences   in your breasts, do not get discouraged. With time you will become more familiar with the variations in your breasts and more comfortable with the exam. How often should I examine my breasts? Examine your breasts every month. If you are breastfeeding, the best time to examine your breasts is after a feeding or after using a breast pump. If you menstruate, the best time to examine your breasts is 5-7 days after your period is over. During your period, your breasts are lumpier, and it may be more difficult to notice changes. When should I see my health care provider? See your health care provider if you notice:  A change in shape or size of your breasts or nipples.  A change in the skin of your breast or nipples, such as a reddened or scaly area.  Unusual discharge from your nipples.  A lump or thick area that was not there before.  Pain in your breasts.  Anything that concerns you.  Urinary Incontinence  Urinary incontinence refers to a condition in which a person is unable to control where and when to pass urine. A person with this condition will urinate when he or she does not mean to  (involuntarily). What are the causes? This condition may be caused by:  Medicines.  Infections.  Constipation.  Overactive bladder muscles.  Weak bladder muscles.  Weak pelvic floor muscles. These muscles provide support for the bladder, intestine, and, in women, the uterus.  Enlarged prostate in men. The prostate is a gland near the bladder. When it gets too big, it can pinch the urethra. With the urethra blocked, the bladder can weaken and lose the ability to empty properly.  Surgery.  Emotional factors, such as anxiety, stress, or post-traumatic stress disorder (PTSD).  Pelvic organ prolapse. This happens in women when organs shift out of place and into the vagina. This shift can prevent the bladder and urethra from working properly. What increases the risk? The following factors may make you more likely to develop this condition:  Older age.  Obesity and physical inactivity.  Pregnancy and childbirth.  Menopause.  Diseases that affect the nerves or spinal cord (neurological diseases).  Long-term (chronic) coughing. This can increase pressure on the bladder and pelvic floor muscles. What are the signs or symptoms? Symptoms may vary depending on the type of urinary incontinence you have. They include:  A sudden urge to urinate, but passing urine involuntarily before you can get to a bathroom (urge incontinence).  Suddenly passing urine with any activity that forces urine to pass, such as coughing, laughing, exercise, or sneezing (stress incontinence).  Needing to urinate often, but urinating only a small amount, or constantly dribbling urine (overflow incontinence).  Urinating because you cannot get to the bathroom in time due to a physical disability, such as arthritis or injury, or communication and thinking problems, such as Alzheimer disease (functional incontinence). How is this diagnosed? This condition may be diagnosed based on:  Your medical history.  A  physical exam.  Tests, such as: ? Urine tests. ? X-rays of your kidney and bladder. ? Ultrasound. ? CT scan. ? Cystoscopy. In this procedure, a health care provider inserts a tube with a light and camera (cystoscope) through the urethra and into the bladder in order to check for problems. ? Urodynamic testing. These tests assess how well the bladder, urethra, and sphincter can store and release urine. There are different types of urodynamic tests, and they vary depending on what the test is measuring.  To help diagnose your condition, your health care provider may recommend that you keep a log of when you urinate and how much you urinate. How is this treated? Treatment for this condition depends on the type of incontinence that you have and its cause. Treatment may include:  Lifestyle changes, such as: ? Quitting smoking. ? Maintaining a healthy weight. ? Staying active. Try to get 150 minutes of moderate-intensity exercise every week. Ask your health care provider which activities are safe for you. ? Eating a healthy diet.  Avoid high-fat foods, like fried foods.  Avoid refined carbohydrates like white bread and white rice.  Limit how much alcohol and caffeine you drink.  Increase your fiber intake. Foods such as fresh fruits, vegetables, beans, and whole grains are healthy sources of fiber.  Pelvic floor muscle exercises.  Bladder training, such as lengthening the amount of time between bathroom breaks, or using the bathroom at regular intervals.  Using techniques to suppress bladder urges. This can include distraction techniques or controlled breathing exercises.  Medicines to relax the bladder muscles and prevent bladder spasms.  Medicines to help slow or prevent the growth of a man's prostate.  Botox injections. These can help relax the bladder muscles.  Using pulses of electricity to help change bladder reflexes (electrical nerve stimulation).  For women, using a medical  device to prevent urine leaks. This is a small, tampon-like, disposable device that is inserted into the urethra.  Injecting collagen or carbon beads (bulking agents) into the urinary sphincter. These can help thicken tissue and close the bladder opening.  Surgery. Follow these instructions at home: Lifestyle  Limit alcohol and caffeine. These can fill your bladder quickly and irritate it.  Keep yourself clean to help prevent odors and skin damage. Ask your doctor about special skin creams and cleansers that can protect the skin from urine.  Consider wearing pads or adult diapers. Make sure to change them regularly, and always change them right after experiencing incontinence. General instructions  Take over-the-counter and prescription medicines only as told by your health care provider.  Use the bathroom about every 3-4 hours, even if you do not feel the need to urinate. Try to empty your bladder completely every time. After urinating, wait a minute. Then try to urinate again.  Make sure you are in a relaxed position while urinating.  If your incontinence is caused by nerve problems, keep a log of the medicines you take and the times you go to the bathroom.  Keep all follow-up visits as told by your health care provider. This is important. Contact a health care provider if:  You have pain that gets worse.  Your incontinence gets worse. Get help right away if:  You have a fever or chills.  You are unable to urinate.  You have redness in your groin area or down your legs. Summary  Urinary incontinence refers to a condition in which a person is unable to control where and when to pass urine.  This condition may be caused by medicines, infection, weak bladder muscles, weak pelvic floor muscles, enlargement of the prostate (in men), or surgery.  The following factors increase your risk for developing this condition: older age, obesity, pregnancy and childbirth, menopause,  neurological diseases, and chronic coughing.  There are several types of urinary incontinence. They include urge incontinence, stress incontinence, overflow incontinence, and functional incontinence.  This condition is usually treated first with lifestyle and behavioral changes, such as quitting smoking, eating a healthier  diet, and doing regular pelvic floor exercises. Other treatment options include medicines, bulking agents, medical devices, electrical nerve stimulation, or surgery. This information is not intended to replace advice given to you by your health care provider. Make sure you discuss any questions you have with your health care provider. Document Released: 07/01/2004 Document Revised: 06/03/2017 Document Reviewed: 09/02/2016 Elsevier Patient Education  2020 [employer].

## 2018-12-29 DIAGNOSIS — M150 Primary generalized (osteo)arthritis: Secondary | ICD-10-CM | POA: Diagnosis not present

## 2018-12-29 DIAGNOSIS — E118 Type 2 diabetes mellitus with unspecified complications: Secondary | ICD-10-CM | POA: Diagnosis not present

## 2018-12-29 DIAGNOSIS — I639 Cerebral infarction, unspecified: Secondary | ICD-10-CM | POA: Diagnosis not present

## 2018-12-30 ENCOUNTER — Other Ambulatory Visit: Payer: Self-pay | Admitting: Family Medicine

## 2018-12-30 DIAGNOSIS — M150 Primary generalized (osteo)arthritis: Secondary | ICD-10-CM | POA: Diagnosis not present

## 2018-12-30 DIAGNOSIS — I639 Cerebral infarction, unspecified: Secondary | ICD-10-CM | POA: Diagnosis not present

## 2018-12-30 DIAGNOSIS — E118 Type 2 diabetes mellitus with unspecified complications: Secondary | ICD-10-CM | POA: Diagnosis not present

## 2018-12-31 ENCOUNTER — Other Ambulatory Visit: Payer: Self-pay | Admitting: Endocrinology

## 2018-12-31 DIAGNOSIS — I639 Cerebral infarction, unspecified: Secondary | ICD-10-CM | POA: Diagnosis not present

## 2018-12-31 DIAGNOSIS — M150 Primary generalized (osteo)arthritis: Secondary | ICD-10-CM | POA: Diagnosis not present

## 2018-12-31 DIAGNOSIS — E118 Type 2 diabetes mellitus with unspecified complications: Secondary | ICD-10-CM | POA: Diagnosis not present

## 2018-12-31 NOTE — Telephone Encounter (Signed)
Please forward refill request to pt's new primary care provider.  

## 2019-01-01 DIAGNOSIS — M150 Primary generalized (osteo)arthritis: Secondary | ICD-10-CM | POA: Diagnosis not present

## 2019-01-01 DIAGNOSIS — E118 Type 2 diabetes mellitus with unspecified complications: Secondary | ICD-10-CM | POA: Diagnosis not present

## 2019-01-01 DIAGNOSIS — I639 Cerebral infarction, unspecified: Secondary | ICD-10-CM | POA: Diagnosis not present

## 2019-01-01 NOTE — Telephone Encounter (Signed)
Forwarding to PCP Dr [name] - Jacklynn Ganong.

## 2019-01-02 DIAGNOSIS — E118 Type 2 diabetes mellitus with unspecified complications: Secondary | ICD-10-CM | POA: Diagnosis not present

## 2019-01-02 DIAGNOSIS — I639 Cerebral infarction, unspecified: Secondary | ICD-10-CM | POA: Diagnosis not present

## 2019-01-02 DIAGNOSIS — M150 Primary generalized (osteo)arthritis: Secondary | ICD-10-CM | POA: Diagnosis not present

## 2019-01-03 DIAGNOSIS — I639 Cerebral infarction, unspecified: Secondary | ICD-10-CM | POA: Diagnosis not present

## 2019-01-03 DIAGNOSIS — M150 Primary generalized (osteo)arthritis: Secondary | ICD-10-CM | POA: Diagnosis not present

## 2019-01-03 DIAGNOSIS — E118 Type 2 diabetes mellitus with unspecified complications: Secondary | ICD-10-CM | POA: Diagnosis not present

## 2019-01-04 DIAGNOSIS — I639 Cerebral infarction, unspecified: Secondary | ICD-10-CM | POA: Diagnosis not present

## 2019-01-04 DIAGNOSIS — M150 Primary generalized (osteo)arthritis: Secondary | ICD-10-CM | POA: Diagnosis not present

## 2019-01-04 DIAGNOSIS — E118 Type 2 diabetes mellitus with unspecified complications: Secondary | ICD-10-CM | POA: Diagnosis not present

## 2019-01-04 NOTE — Telephone Encounter (Signed)
Patient need to schedule an ov for more refills. 

## 2019-01-05 DIAGNOSIS — M150 Primary generalized (osteo)arthritis: Secondary | ICD-10-CM | POA: Diagnosis not present

## 2019-01-05 DIAGNOSIS — E118 Type 2 diabetes mellitus with unspecified complications: Secondary | ICD-10-CM | POA: Diagnosis not present

## 2019-01-05 DIAGNOSIS — I639 Cerebral infarction, unspecified: Secondary | ICD-10-CM | POA: Diagnosis not present

## 2019-01-06 DIAGNOSIS — I639 Cerebral infarction, unspecified: Secondary | ICD-10-CM | POA: Diagnosis not present

## 2019-01-06 DIAGNOSIS — E118 Type 2 diabetes mellitus with unspecified complications: Secondary | ICD-10-CM | POA: Diagnosis not present

## 2019-01-06 DIAGNOSIS — M150 Primary generalized (osteo)arthritis: Secondary | ICD-10-CM | POA: Diagnosis not present

## 2019-01-07 DIAGNOSIS — E118 Type 2 diabetes mellitus with unspecified complications: Secondary | ICD-10-CM | POA: Diagnosis not present

## 2019-01-07 DIAGNOSIS — M150 Primary generalized (osteo)arthritis: Secondary | ICD-10-CM | POA: Diagnosis not present

## 2019-01-07 DIAGNOSIS — I639 Cerebral infarction, unspecified: Secondary | ICD-10-CM | POA: Diagnosis not present

## 2019-01-08 DIAGNOSIS — I639 Cerebral infarction, unspecified: Secondary | ICD-10-CM | POA: Diagnosis not present

## 2019-01-08 DIAGNOSIS — E118 Type 2 diabetes mellitus with unspecified complications: Secondary | ICD-10-CM | POA: Diagnosis not present

## 2019-01-08 DIAGNOSIS — M150 Primary generalized (osteo)arthritis: Secondary | ICD-10-CM | POA: Diagnosis not present

## 2019-01-09 DIAGNOSIS — E118 Type 2 diabetes mellitus with unspecified complications: Secondary | ICD-10-CM | POA: Diagnosis not present

## 2019-01-09 DIAGNOSIS — M150 Primary generalized (osteo)arthritis: Secondary | ICD-10-CM | POA: Diagnosis not present

## 2019-01-09 DIAGNOSIS — I639 Cerebral infarction, unspecified: Secondary | ICD-10-CM | POA: Diagnosis not present

## 2019-01-10 DIAGNOSIS — M150 Primary generalized (osteo)arthritis: Secondary | ICD-10-CM | POA: Diagnosis not present

## 2019-01-10 DIAGNOSIS — E118 Type 2 diabetes mellitus with unspecified complications: Secondary | ICD-10-CM | POA: Diagnosis not present

## 2019-01-10 DIAGNOSIS — I639 Cerebral infarction, unspecified: Secondary | ICD-10-CM | POA: Diagnosis not present

## 2019-01-11 ENCOUNTER — Other Ambulatory Visit: Payer: Self-pay

## 2019-01-11 DIAGNOSIS — M150 Primary generalized (osteo)arthritis: Secondary | ICD-10-CM | POA: Diagnosis not present

## 2019-01-11 DIAGNOSIS — I639 Cerebral infarction, unspecified: Secondary | ICD-10-CM | POA: Diagnosis not present

## 2019-01-11 DIAGNOSIS — E118 Type 2 diabetes mellitus with unspecified complications: Secondary | ICD-10-CM | POA: Diagnosis not present

## 2019-01-11 NOTE — Patient Outreach (Signed)
[facility]) Care Management  01/11/2019  MCCALL WILL 05-28-1961 [id number]    2nd attempt to outreach the patient.  The patient answered the phone.  When asked could we complete her initial assessment she stated " no not today"  The patient asked if I could call her at another time.   Plan: RN Health Coach will send letter. [place] will make outreach attempt to the patient within thirty business days.   Lazaro Arms RN, BSN, [place] Direct Dial:  [phone]  Fax: [phone]

## 2019-01-12 DIAGNOSIS — I639 Cerebral infarction, unspecified: Secondary | ICD-10-CM | POA: Diagnosis not present

## 2019-01-12 DIAGNOSIS — E118 Type 2 diabetes mellitus with unspecified complications: Secondary | ICD-10-CM | POA: Diagnosis not present

## 2019-01-12 DIAGNOSIS — M150 Primary generalized (osteo)arthritis: Secondary | ICD-10-CM | POA: Diagnosis not present

## 2019-01-13 DIAGNOSIS — E118 Type 2 diabetes mellitus with unspecified complications: Secondary | ICD-10-CM | POA: Diagnosis not present

## 2019-01-13 DIAGNOSIS — I639 Cerebral infarction, unspecified: Secondary | ICD-10-CM | POA: Diagnosis not present

## 2019-01-13 DIAGNOSIS — M150 Primary generalized (osteo)arthritis: Secondary | ICD-10-CM | POA: Diagnosis not present

## 2019-01-14 DIAGNOSIS — E118 Type 2 diabetes mellitus with unspecified complications: Secondary | ICD-10-CM | POA: Diagnosis not present

## 2019-01-14 DIAGNOSIS — M150 Primary generalized (osteo)arthritis: Secondary | ICD-10-CM | POA: Diagnosis not present

## 2019-01-14 DIAGNOSIS — I639 Cerebral infarction, unspecified: Secondary | ICD-10-CM | POA: Diagnosis not present

## 2019-01-15 DIAGNOSIS — I639 Cerebral infarction, unspecified: Secondary | ICD-10-CM | POA: Diagnosis not present

## 2019-01-15 DIAGNOSIS — E118 Type 2 diabetes mellitus with unspecified complications: Secondary | ICD-10-CM | POA: Diagnosis not present

## 2019-01-15 DIAGNOSIS — M150 Primary generalized (osteo)arthritis: Secondary | ICD-10-CM | POA: Diagnosis not present

## 2019-01-16 ENCOUNTER — Other Ambulatory Visit: Payer: Self-pay

## 2019-01-16 DIAGNOSIS — E118 Type 2 diabetes mellitus with unspecified complications: Secondary | ICD-10-CM | POA: Diagnosis not present

## 2019-01-16 DIAGNOSIS — I639 Cerebral infarction, unspecified: Secondary | ICD-10-CM | POA: Diagnosis not present

## 2019-01-16 DIAGNOSIS — M150 Primary generalized (osteo)arthritis: Secondary | ICD-10-CM | POA: Diagnosis not present

## 2019-01-16 NOTE — Patient Outreach (Signed)
[facility]) Care Management  01/16/2019   Connie Ruiz 06/21/1960 [id number]      Outreach attempt # 3 to the patient for initial assessment.  HIPAA verified by the patient.  The assessment was complete with the patient and her daughter [name] on speaker phone.  Verbal permission given by the patient and she is on the ROI.  Social: The patient lives in the home alone.  Her daughter [name] ( on the ROI) helps with her care.   She also has an aid that comes to the home. She is independent assist with her ADLS and assist with her IADLS.  Her daughter takes her to appointments. The patient states that she has pain all over and she rates her pain at a 8/10.  She takes medication to help with the pain. The patient denies any falls.  The equipment in the home includes:  CBG meters, Walker, [place] , and shower chair with back.  Conditions: Per chart review and speaking with the patient her conditions consist of: HTN, CHF, CVA, TIA,GERD, Type II Diabetes, Hypothyroidism, Vascular dementia, Hemiparesis as late effect of CVA, Lupus, Gout, Lupus and Kidney transplantation. The patient states that she checks her blood pressures daily and the daughter confirms.  She is drinking ensure and monitors her diet for salt. The patient does have some memory issue due to a stroke. The patient does not weigh daily. She states that she no longer has CHF.  The patient checks her blood sugars daily.  Her last a1c in March of this year was 6.7.  She states she does not exercise except for walking in the home.  Medications: The patient is on twenty three medications.  The patient helps with her medications.  She did not express any concerns with paying for her medications. The patient and daughter requested [place] call to see about setting up pill packs.  Appointments: 8/12 coumadin clinic, 11/19 follow up with Metta Clines.  Advanced Directives: The patient does not have an advanced directive but has  requested information.   Current Medications:  Current Outpatient Medications  Medication Sig Dispense Refill  . acetaminophen (TYLENOL) 500 MG tablet Take 1,000 mg by mouth daily as needed for mild pain.    Marland Kitchen alendronate (FOSAMAX) 70 MG tablet TAKE 1 TABLET BY MOUTH ONCE A WEEK ON AN EMPTY STOMACH ON SATURDAYS WITH A FULL GLASS OF WATER. (Patient taking differently: Take 70 mg by mouth once a week. ) 12 tablet 0  . atorvastatin (LIPITOR) 40 MG tablet Take 40 mg by mouth daily at 6 PM.     . Blood Glucose Monitoring Suppl (ACCU-CHEK AVIVA PLUS) w/Device KIT 1 Device by Does not apply route daily. 1 kit 1  . calcitRIOL (ROCALTROL) 0.25 MCG capsule TAKE 1 CAPSULE(0.25 MCG) BY MOUTH DAILY (Patient taking differently: Take 0.25 mcg by mouth every other day. ) 30 capsule 0  . diltiazem (TIAZAC) 360 MG 24 hr capsule Take 1 capsule (360 mg total) by mouth daily. 90 capsule 1  . donepezil (ARICEPT) 10 MG tablet Take 10 mg by mouth at bedtime.     Marland Kitchen esomeprazole (NEXIUM) 20 MG capsule TAKE 1 CAPSULE(20 MG) BY MOUTH DAILY (Patient taking differently: Take 20 mg by mouth daily at 12 noon. ) 30 capsule 11  . feeding supplement, ENSURE ENLIVE, (ENSURE ENLIVE) LIQD Take 237 mLs by mouth 2 (two) times daily between meals.    Marland Kitchen glucose blood (ONE TOUCH ULTRA TEST) test strip Use to  check blood sugar 1 time per day 100 each 2  . levothyroxine (SYNTHROID) 175 MCG tablet Take 1 tablet (175 mcg total) by mouth daily before breakfast. 30 tablet 2  . losartan (COZAAR) 100 MG tablet Take 1 tablet (100 mg total) by mouth daily. 90 tablet 1  . magnesium oxide (MAG-OX) 400 MG tablet Take 800 mg by mouth daily.     . metFORMIN (GLUCOPHAGE-XR) 500 MG 24 hr tablet TAKE 1 TABLET(500 MG) BY MOUTH DAILY WITH BREAKFAST 90 tablet 0  . mirtazapine (REMERON) 30 MG tablet Take 1 tablet (30 mg total) by mouth at bedtime. 30 tablet 1  . mycophenolate (CELLCEPT) 250 MG capsule TAKE ONE CAPSULE BY MOUTH TWICE DAILY    . nystatin  (MYCOSTATIN) powder Apply 1 g topically 4 (four) times daily as needed. For yeast under breast    . ondansetron (ZOFRAN ODT) 4 MG disintegrating tablet 90m ODT q4 hours prn nausea/vomiting 15 tablet 0  . ONETOUCH DELICA LANCETS 383AMISC Use to check blood sugar 1 time per day 100 each 2  . oxybutynin (DITROPAN) 5 MG tablet Take 5 mg by mouth daily as needed for bladder spasms.     . Potassium Chloride ER 20 MEQ TBCR Take 20 mEq by mouth daily. 90 tablet 3  . predniSONE (DELTASONE) 5 MG tablet Take 5 mg by mouth daily with breakfast.     . QUEtiapine (SEROQUEL) 25 MG tablet Take 1 tablet (25 mg total) by mouth at bedtime. 30 tablet 1  . sertraline (ZOLOFT) 100 MG tablet Take 0.5 tablets (50 mg total) by mouth daily. 15 tablet 2  . tacrolimus (PROGRAF) 1 MG capsule Take 1-2 mg by mouth See admin instructions. 2 mg in the am and 1 mg at night    . warfarin (COUMADIN) 5 MG tablet Take 0.5-1 tablets (2.5-5 mg total) by mouth See admin instructions. 50 tablet 0  . sucralfate (CARAFATE) 1 g tablet Take 1 tablet (1 g total) by mouth 4 (four) times daily for 30 days.     No current facility-administered medications for this visit.     Functional Status:  In your present state of health, do you have any difficulty performing the following activities: 01/16/2019 08/14/2018  Hearing? N N  Vision? N N  Difficulty concentrating or making decisions? Y Y  Comment patient has had stokes -  Walking or climbing stairs? N Y  Dressing or bathing? YTempie Donning Comment needs assistance with washing her back -  Doing errands, shopping? Y Y  Comment She does not do errands her daughter will help her -  PConservation officer, natureand eating ? N -  Using the Toilet? N -  In the past six months, have you accidently leaked urine? N -  Do you have problems with loss of bowel control? N -  Managing your Medications? Y -  Comment Daughter helps -  Managing your Finances? Y -  Comment Daughteer helps -  Housekeeping or managing your  Housekeeping? Y -  Comment Daughter helps -  Some recent data might be hidden    Fall/Depression Screening: Fall Risk  01/16/2019 04/14/2018 04/15/2017  Falls in the past year? 0 0 No  Number falls in past yr: - - -  Injury with Fall? - - -  Risk Factor Category  - - -  Follow up - - -   PHQ 2/9 Scores 01/16/2019 11/28/2018 08/27/2015  PHQ - 2 Score '1 1 2  ' PHQ- 9 Score - -  7    Assessment: Patient will benefit from health coach outreach for disease management and support.  THN CM Care Plan Problem One     Most Recent Value  Care Plan Problem One  Knowledge deficit related to diease management.  Role Documenting the Problem One  [place] for Problem One  Active  THN Long Term Goal   In 90 days the patient willverbalize that she has kept her blood pressure around 130/80  THN Long Term Goal Start Date  01/16/19  Interventions for Problem One Long Term Goal  Discussed decrease salt in the diet eating healthy foods, encouraged the patient to  exercise, encourage to continue to monitoring blood pressure,       Plan:  [place] will provide ongoing education for patient on hypertension through phone calls and sending printed information to patient for further discussion.   RN Health Coach will send Booklet on hypertension.   RN Health Coach will send initial barriers letter, assessment, and care plan to primary care physician.   [place] will send Consent form. Will follow up next month to make sure the patient received the educational material. Will make referral to pharmacy for set up of pill packs.  Lazaro Arms RN, BSN, [place] Direct Dial:  [phone]  Fax: [phone]

## 2019-01-17 ENCOUNTER — Ambulatory Visit: Payer: Self-pay

## 2019-01-17 ENCOUNTER — Other Ambulatory Visit: Payer: Self-pay

## 2019-01-17 ENCOUNTER — Ambulatory Visit (INDEPENDENT_AMBULATORY_CARE_PROVIDER_SITE_OTHER): Payer: Medicare Other | Admitting: Pharmacist Clinician (PhC)/ Clinical Pharmacy Specialist

## 2019-01-17 DIAGNOSIS — I2699 Other pulmonary embolism without acute cor pulmonale: Secondary | ICD-10-CM

## 2019-01-17 DIAGNOSIS — Z7901 Long term (current) use of anticoagulants: Secondary | ICD-10-CM

## 2019-01-17 DIAGNOSIS — I6350 Cerebral infarction due to unspecified occlusion or stenosis of unspecified cerebral artery: Secondary | ICD-10-CM | POA: Diagnosis not present

## 2019-01-17 DIAGNOSIS — M150 Primary generalized (osteo)arthritis: Secondary | ICD-10-CM | POA: Diagnosis not present

## 2019-01-17 DIAGNOSIS — E118 Type 2 diabetes mellitus with unspecified complications: Secondary | ICD-10-CM | POA: Diagnosis not present

## 2019-01-17 DIAGNOSIS — I639 Cerebral infarction, unspecified: Secondary | ICD-10-CM | POA: Diagnosis not present

## 2019-01-17 LAB — POCT INR: INR: 1.8 — AB (ref 2.0–3.0)

## 2019-01-17 NOTE — Patient Instructions (Signed)
Take 1/2 tablet daily except 1 tablet on Mondays, Wednesday Fridays. Recheck INR in 3 weeks.  Call coumadin clinic with any questions  [phone]

## 2019-01-18 ENCOUNTER — Other Ambulatory Visit: Payer: Self-pay | Admitting: Pharmacist

## 2019-01-18 DIAGNOSIS — I639 Cerebral infarction, unspecified: Secondary | ICD-10-CM | POA: Diagnosis not present

## 2019-01-18 DIAGNOSIS — E118 Type 2 diabetes mellitus with unspecified complications: Secondary | ICD-10-CM | POA: Diagnosis not present

## 2019-01-18 DIAGNOSIS — M150 Primary generalized (osteo)arthritis: Secondary | ICD-10-CM | POA: Diagnosis not present

## 2019-01-18 NOTE — Patient Outreach (Signed)
[place] [facility]) Care Management  [place]  01/18/2019  Connie Ruiz [birth year] [id number]   Reason for referral: Medication Management / assistance with setting up compliance packs  Referral source: [place] Current insurance: [facility]  Outreach:  Unsuccessful telephone call attempt #1 to patient.  Spoke with daughter who requests that I call her back tomorrow at 12:00 PM.    Plan:  Will outreach patient again tomorrow at [place], PharmD, [place] [phone]

## 2019-01-19 ENCOUNTER — Other Ambulatory Visit: Payer: Self-pay | Admitting: Pharmacist

## 2019-01-19 ENCOUNTER — Ambulatory Visit: Payer: Self-pay | Admitting: Pharmacist

## 2019-01-19 DIAGNOSIS — E118 Type 2 diabetes mellitus with unspecified complications: Secondary | ICD-10-CM | POA: Diagnosis not present

## 2019-01-19 DIAGNOSIS — I639 Cerebral infarction, unspecified: Secondary | ICD-10-CM | POA: Diagnosis not present

## 2019-01-19 DIAGNOSIS — M150 Primary generalized (osteo)arthritis: Secondary | ICD-10-CM | POA: Diagnosis not present

## 2019-01-19 NOTE — Patient Outreach (Signed)
[place] [facility]) Care Management  [place] 01/19/2019  Connie Ruiz Feb 09, 1961 [id number]   Reason for referral: Medication Management / assistance with setting up compliance packs  Referral source: [place] Current insurance: [facility]  Outreach:  Unsuccessful telephone call attempt #2 to patient. Unable to leave message  Plan:  -I will make another outreach attempt to patient within 3-4 business days.    Ralene Bathe, PharmD, [place] [phone]

## 2019-01-20 DIAGNOSIS — I639 Cerebral infarction, unspecified: Secondary | ICD-10-CM | POA: Diagnosis not present

## 2019-01-20 DIAGNOSIS — E118 Type 2 diabetes mellitus with unspecified complications: Secondary | ICD-10-CM | POA: Diagnosis not present

## 2019-01-20 DIAGNOSIS — M150 Primary generalized (osteo)arthritis: Secondary | ICD-10-CM | POA: Diagnosis not present

## 2019-01-21 DIAGNOSIS — I639 Cerebral infarction, unspecified: Secondary | ICD-10-CM | POA: Diagnosis not present

## 2019-01-21 DIAGNOSIS — M150 Primary generalized (osteo)arthritis: Secondary | ICD-10-CM | POA: Diagnosis not present

## 2019-01-21 DIAGNOSIS — E118 Type 2 diabetes mellitus with unspecified complications: Secondary | ICD-10-CM | POA: Diagnosis not present

## 2019-01-22 DIAGNOSIS — M150 Primary generalized (osteo)arthritis: Secondary | ICD-10-CM | POA: Diagnosis not present

## 2019-01-22 DIAGNOSIS — E118 Type 2 diabetes mellitus with unspecified complications: Secondary | ICD-10-CM | POA: Diagnosis not present

## 2019-01-22 DIAGNOSIS — I639 Cerebral infarction, unspecified: Secondary | ICD-10-CM | POA: Diagnosis not present

## 2019-01-23 DIAGNOSIS — M150 Primary generalized (osteo)arthritis: Secondary | ICD-10-CM | POA: Diagnosis not present

## 2019-01-23 DIAGNOSIS — E118 Type 2 diabetes mellitus with unspecified complications: Secondary | ICD-10-CM | POA: Diagnosis not present

## 2019-01-23 DIAGNOSIS — I639 Cerebral infarction, unspecified: Secondary | ICD-10-CM | POA: Diagnosis not present

## 2019-01-24 ENCOUNTER — Other Ambulatory Visit: Payer: Self-pay | Admitting: Pharmacist

## 2019-01-24 ENCOUNTER — Ambulatory Visit: Payer: Self-pay | Admitting: Pharmacist

## 2019-01-24 DIAGNOSIS — E118 Type 2 diabetes mellitus with unspecified complications: Secondary | ICD-10-CM | POA: Diagnosis not present

## 2019-01-24 DIAGNOSIS — I639 Cerebral infarction, unspecified: Secondary | ICD-10-CM | POA: Diagnosis not present

## 2019-01-24 DIAGNOSIS — M150 Primary generalized (osteo)arthritis: Secondary | ICD-10-CM | POA: Diagnosis not present

## 2019-01-24 NOTE — Patient Outreach (Signed)
[facility] [facility]) Care Management  [place]  01/24/2019  Connie Ruiz 01-02-1961 [id number]    Reason for referral:Medication Management/ assistance with setting up compliance packs  Referral source:THN [place]  Outreach:  Unsuccessful telephone call attempt #3 to patient.   Unable to leave message as home phone voicemail box is full.  The other 2 phone numbers listed in chart are wrong numbers.    Plan:  -I will follow-up on 10th business day from opening case.  If no response from patient at this time, I will close [place] case.    Ralene Bathe, PharmD, [place] [phone]

## 2019-01-28 ENCOUNTER — Other Ambulatory Visit: Payer: Self-pay | Admitting: Endocrinology

## 2019-01-29 NOTE — Telephone Encounter (Signed)
Please advise if you wish to manage and refille

## 2019-01-29 NOTE — Telephone Encounter (Signed)
Per Dr. Cordelia Pen request, I am forwarding you this refill request. Please refill if appropriate

## 2019-01-29 NOTE — Telephone Encounter (Signed)
Please forward refill request to pt's new primary care provider.  

## 2019-01-30 ENCOUNTER — Other Ambulatory Visit: Payer: Self-pay | Admitting: Family Medicine

## 2019-01-31 ENCOUNTER — Other Ambulatory Visit: Payer: Self-pay | Admitting: Pharmacist

## 2019-01-31 NOTE — Patient Outreach (Signed)
[facility]) Care Management [place]  01/31/2019  Connie Ruiz [birth year] [id number]  Reason for referral:Medication Management/ assistance with setting up compliance packs  Referral source:THN [place]  [place] pharmacy case is being closed due to the following reasons:  -Unable to engage or maintain contact with patient   Ralene Bathe, PharmD, [place] [phone]

## 2019-02-01 DIAGNOSIS — E118 Type 2 diabetes mellitus with unspecified complications: Secondary | ICD-10-CM | POA: Diagnosis not present

## 2019-02-01 DIAGNOSIS — I639 Cerebral infarction, unspecified: Secondary | ICD-10-CM | POA: Diagnosis not present

## 2019-02-01 DIAGNOSIS — M150 Primary generalized (osteo)arthritis: Secondary | ICD-10-CM | POA: Diagnosis not present

## 2019-02-02 DIAGNOSIS — M150 Primary generalized (osteo)arthritis: Secondary | ICD-10-CM | POA: Diagnosis not present

## 2019-02-02 DIAGNOSIS — I639 Cerebral infarction, unspecified: Secondary | ICD-10-CM | POA: Diagnosis not present

## 2019-02-02 DIAGNOSIS — E118 Type 2 diabetes mellitus with unspecified complications: Secondary | ICD-10-CM | POA: Diagnosis not present

## 2019-02-03 DIAGNOSIS — I639 Cerebral infarction, unspecified: Secondary | ICD-10-CM | POA: Diagnosis not present

## 2019-02-03 DIAGNOSIS — E118 Type 2 diabetes mellitus with unspecified complications: Secondary | ICD-10-CM | POA: Diagnosis not present

## 2019-02-03 DIAGNOSIS — M150 Primary generalized (osteo)arthritis: Secondary | ICD-10-CM | POA: Diagnosis not present

## 2019-02-04 DIAGNOSIS — I639 Cerebral infarction, unspecified: Secondary | ICD-10-CM | POA: Diagnosis not present

## 2019-02-04 DIAGNOSIS — M150 Primary generalized (osteo)arthritis: Secondary | ICD-10-CM | POA: Diagnosis not present

## 2019-02-04 DIAGNOSIS — E118 Type 2 diabetes mellitus with unspecified complications: Secondary | ICD-10-CM | POA: Diagnosis not present

## 2019-02-05 DIAGNOSIS — E118 Type 2 diabetes mellitus with unspecified complications: Secondary | ICD-10-CM | POA: Diagnosis not present

## 2019-02-05 DIAGNOSIS — I639 Cerebral infarction, unspecified: Secondary | ICD-10-CM | POA: Diagnosis not present

## 2019-02-05 DIAGNOSIS — M150 Primary generalized (osteo)arthritis: Secondary | ICD-10-CM | POA: Diagnosis not present

## 2019-02-06 DIAGNOSIS — M150 Primary generalized (osteo)arthritis: Secondary | ICD-10-CM | POA: Diagnosis not present

## 2019-02-06 DIAGNOSIS — I639 Cerebral infarction, unspecified: Secondary | ICD-10-CM | POA: Diagnosis not present

## 2019-02-06 DIAGNOSIS — E118 Type 2 diabetes mellitus with unspecified complications: Secondary | ICD-10-CM | POA: Diagnosis not present

## 2019-02-07 ENCOUNTER — Other Ambulatory Visit: Payer: Self-pay

## 2019-02-07 ENCOUNTER — Ambulatory Visit (INDEPENDENT_AMBULATORY_CARE_PROVIDER_SITE_OTHER): Payer: Medicare Other

## 2019-02-07 DIAGNOSIS — Z7901 Long term (current) use of anticoagulants: Secondary | ICD-10-CM

## 2019-02-07 DIAGNOSIS — I2699 Other pulmonary embolism without acute cor pulmonale: Secondary | ICD-10-CM

## 2019-02-07 DIAGNOSIS — I639 Cerebral infarction, unspecified: Secondary | ICD-10-CM | POA: Diagnosis not present

## 2019-02-07 DIAGNOSIS — I6350 Cerebral infarction due to unspecified occlusion or stenosis of unspecified cerebral artery: Secondary | ICD-10-CM

## 2019-02-07 DIAGNOSIS — E118 Type 2 diabetes mellitus with unspecified complications: Secondary | ICD-10-CM | POA: Diagnosis not present

## 2019-02-07 DIAGNOSIS — M150 Primary generalized (osteo)arthritis: Secondary | ICD-10-CM | POA: Diagnosis not present

## 2019-02-07 LAB — POCT INR: INR: 2 (ref 2.0–3.0)

## 2019-02-07 NOTE — Patient Instructions (Signed)
Description   Take 1.5 tablets today, then resume same dosage 1/2 tablet daily except 1 tablet on Mondays, Wednesday Fridays. Recheck INR in 4 weeks.  Call coumadin clinic with any questions  [phone]

## 2019-02-08 DIAGNOSIS — I639 Cerebral infarction, unspecified: Secondary | ICD-10-CM | POA: Diagnosis not present

## 2019-02-08 DIAGNOSIS — M150 Primary generalized (osteo)arthritis: Secondary | ICD-10-CM | POA: Diagnosis not present

## 2019-02-08 DIAGNOSIS — E118 Type 2 diabetes mellitus with unspecified complications: Secondary | ICD-10-CM | POA: Diagnosis not present

## 2019-02-09 DIAGNOSIS — M150 Primary generalized (osteo)arthritis: Secondary | ICD-10-CM | POA: Diagnosis not present

## 2019-02-09 DIAGNOSIS — E118 Type 2 diabetes mellitus with unspecified complications: Secondary | ICD-10-CM | POA: Diagnosis not present

## 2019-02-09 DIAGNOSIS — I639 Cerebral infarction, unspecified: Secondary | ICD-10-CM | POA: Diagnosis not present

## 2019-02-10 DIAGNOSIS — I639 Cerebral infarction, unspecified: Secondary | ICD-10-CM | POA: Diagnosis not present

## 2019-02-10 DIAGNOSIS — E118 Type 2 diabetes mellitus with unspecified complications: Secondary | ICD-10-CM | POA: Diagnosis not present

## 2019-02-10 DIAGNOSIS — M150 Primary generalized (osteo)arthritis: Secondary | ICD-10-CM | POA: Diagnosis not present

## 2019-02-11 DIAGNOSIS — E118 Type 2 diabetes mellitus with unspecified complications: Secondary | ICD-10-CM | POA: Diagnosis not present

## 2019-02-11 DIAGNOSIS — M150 Primary generalized (osteo)arthritis: Secondary | ICD-10-CM | POA: Diagnosis not present

## 2019-02-11 DIAGNOSIS — I639 Cerebral infarction, unspecified: Secondary | ICD-10-CM | POA: Diagnosis not present

## 2019-02-12 ENCOUNTER — Other Ambulatory Visit: Payer: Self-pay | Admitting: Endocrinology

## 2019-02-12 DIAGNOSIS — E118 Type 2 diabetes mellitus with unspecified complications: Secondary | ICD-10-CM | POA: Diagnosis not present

## 2019-02-12 DIAGNOSIS — M150 Primary generalized (osteo)arthritis: Secondary | ICD-10-CM | POA: Diagnosis not present

## 2019-02-12 DIAGNOSIS — I639 Cerebral infarction, unspecified: Secondary | ICD-10-CM | POA: Diagnosis not present

## 2019-02-12 NOTE — Telephone Encounter (Signed)
Please refill x 1 F/u is due  

## 2019-02-13 DIAGNOSIS — I639 Cerebral infarction, unspecified: Secondary | ICD-10-CM | POA: Diagnosis not present

## 2019-02-13 DIAGNOSIS — E118 Type 2 diabetes mellitus with unspecified complications: Secondary | ICD-10-CM | POA: Diagnosis not present

## 2019-02-13 DIAGNOSIS — M150 Primary generalized (osteo)arthritis: Secondary | ICD-10-CM | POA: Diagnosis not present

## 2019-02-14 DIAGNOSIS — I639 Cerebral infarction, unspecified: Secondary | ICD-10-CM | POA: Diagnosis not present

## 2019-02-14 DIAGNOSIS — M150 Primary generalized (osteo)arthritis: Secondary | ICD-10-CM | POA: Diagnosis not present

## 2019-02-14 DIAGNOSIS — E118 Type 2 diabetes mellitus with unspecified complications: Secondary | ICD-10-CM | POA: Diagnosis not present

## 2019-02-15 DIAGNOSIS — I639 Cerebral infarction, unspecified: Secondary | ICD-10-CM | POA: Diagnosis not present

## 2019-02-15 DIAGNOSIS — M150 Primary generalized (osteo)arthritis: Secondary | ICD-10-CM | POA: Diagnosis not present

## 2019-02-15 DIAGNOSIS — E118 Type 2 diabetes mellitus with unspecified complications: Secondary | ICD-10-CM | POA: Diagnosis not present

## 2019-02-16 ENCOUNTER — Other Ambulatory Visit: Payer: Self-pay

## 2019-02-16 DIAGNOSIS — M150 Primary generalized (osteo)arthritis: Secondary | ICD-10-CM | POA: Diagnosis not present

## 2019-02-16 DIAGNOSIS — E118 Type 2 diabetes mellitus with unspecified complications: Secondary | ICD-10-CM | POA: Diagnosis not present

## 2019-02-16 DIAGNOSIS — I639 Cerebral infarction, unspecified: Secondary | ICD-10-CM | POA: Diagnosis not present

## 2019-02-16 NOTE — Patient Outreach (Signed)
[place] [facility]) Care Management  02/16/2019   Connie Ruiz May 02, 1961 [id number]  Subjective: Successful outreach to the patient.  Two patient identifiers given.  The patient states that she received the educational material mailed to her. She haas not reviewed it.  She is waiting to go over it with her daughter.  She states that she is having pain all over her body from Lupus and rates that pain at a 5/10.  She denies having any falls.  She is checking her blood pressure daily.  Her blood pressure today is 111/82.  She is taking her medications like she should. She states that she has a good appetite.  She states that she has not been sleeping well.  She has had two people in her family that passed.  Advised the patient to discuss the issue with her physician at her next appointment on the 76 th of this month.  She verbalized understanding.   Current Medications:  Current Outpatient Medications  Medication Sig Dispense Refill  . acetaminophen (TYLENOL) 500 MG tablet Take 1,000 mg by mouth daily as needed for mild pain.    Marland Kitchen alendronate (FOSAMAX) 70 MG tablet TAKE 1 TABLET BY MOUTH ONCE A WEEK ON AN EMPTY STOMACH ON SATURDAYS WITH A FULL GLASS OF WATER. (Patient taking differently: Take 70 mg by mouth once a week. ) 12 tablet 0  . atorvastatin (LIPITOR) 40 MG tablet Take 40 mg by mouth daily at 6 PM.     . Blood Glucose Monitoring Suppl (ACCU-CHEK AVIVA PLUS) w/Device KIT 1 Device by Does not apply route daily. 1 kit 1  . calcitRIOL (ROCALTROL) 0.25 MCG capsule TAKE 1 CAPSULE(0.25 MCG) BY MOUTH DAILY. NO REFILLS WITHOUT APPOINTMENT 30 capsule 0  . diltiazem (TIAZAC) 360 MG 24 hr capsule Take 1 capsule (360 mg total) by mouth daily. 90 capsule 1  . donepezil (ARICEPT) 10 MG tablet Take 10 mg by mouth at bedtime.     Marland Kitchen esomeprazole (NEXIUM) 20 MG capsule TAKE 1 CAPSULE(20 MG) BY MOUTH DAILY (Patient taking differently: Take 20 mg by mouth daily at 12 noon. ) 30 capsule 11  . feeding  supplement, ENSURE ENLIVE, (ENSURE ENLIVE) LIQD Take 237 mLs by mouth 2 (two) times daily between meals.    Marland Kitchen glucose blood (ONE TOUCH ULTRA TEST) test strip Use to check blood sugar 1 time per day 100 each 2  . levothyroxine (SYNTHROID) 175 MCG tablet TAKE 1 TABLET(175 MCG) BY MOUTH DAILY BEFORE BREAKFAST 30 tablet 0  . losartan (COZAAR) 100 MG tablet Take 1 tablet (100 mg total) by mouth daily. 90 tablet 1  . magnesium oxide (MAG-OX) 400 MG tablet Take 800 mg by mouth daily.     . metFORMIN (GLUCOPHAGE-XR) 500 MG 24 hr tablet TAKE 1 TABLET(500 MG) BY MOUTH DAILY WITH BREAKFAST 90 tablet 0  . mirtazapine (REMERON) 30 MG tablet Take 1 tablet (30 mg total) by mouth at bedtime. 30 tablet 1  . mycophenolate (CELLCEPT) 250 MG capsule TAKE ONE CAPSULE BY MOUTH TWICE DAILY    . nystatin (MYCOSTATIN) powder Apply 1 g topically 4 (four) times daily as needed. For yeast under breast    . ondansetron (ZOFRAN ODT) 4 MG disintegrating tablet 13m ODT q4 hours prn nausea/vomiting 15 tablet 0  . ONETOUCH DELICA LANCETS 340JMISC Use to check blood sugar 1 time per day 100 each 2  . oxybutynin (DITROPAN) 5 MG tablet Take 5 mg by mouth daily as needed for bladder spasms.     .Marland Kitchen  Potassium Chloride ER 20 MEQ TBCR Take 1 tablet by mouth daily. NEEDS POTASSIUM RE-CHECK. 90 tablet 0  . predniSONE (DELTASONE) 5 MG tablet Take 5 mg by mouth daily with breakfast.     . QUEtiapine (SEROQUEL) 25 MG tablet Take 1 tablet (25 mg total) by mouth at bedtime. 30 tablet 1  . sertraline (ZOLOFT) 100 MG tablet Take 0.5 tablets (50 mg total) by mouth daily. 15 tablet 2  . sucralfate (CARAFATE) 1 g tablet Take 1 tablet (1 g total) by mouth 4 (four) times daily for 30 days.    Marland Kitchen tacrolimus (PROGRAF) 1 MG capsule Take 1-2 mg by mouth See admin instructions. 2 mg in the am and 1 mg at night    . warfarin (COUMADIN) 5 MG tablet Take 0.5-1 tablets (2.5-5 mg total) by mouth See admin instructions. 50 tablet 0   No current  facility-administered medications for this visit.     Functional Status:  In your present state of health, do you have any difficulty performing the following activities: 01/16/2019 08/14/2018  Hearing? N N  Vision? N N  Difficulty concentrating or making decisions? Y Y  Comment patient has had stokes -  Walking or climbing stairs? N Y  Dressing or bathing? Tempie Donning  Comment needs assistance with washing her back -  Doing errands, shopping? Y Y  Comment She does not do errands her daughter will help her -  Conservation officer, nature and eating ? N -  Using the Toilet? N -  In the past six months, have you accidently leaked urine? N -  Do you have problems with loss of bowel control? N -  Managing your Medications? Y -  Comment Daughter helps -  Managing your Finances? Y -  Comment Daughteer helps -  Housekeeping or managing your Housekeeping? Y -  Comment Daughter helps -  Some recent data might be hidden    Fall/Depression Screening: Fall Risk  02/16/2019 01/16/2019 04/14/2018  Falls in the past year? 0 0 0  Number falls in past yr: - - -  Injury with Fall? - - -  Risk Factor Category  - - -  Follow up - - -   PHQ 2/9 Scores 01/16/2019 11/28/2018 08/27/2015  PHQ - 2 Score '1 1 2  ' PHQ- 9 Score - - 7    Assessment: Patient will continue to benefit from health coach outreach for disease management and support. THN CM Care Plan Problem One     Most Recent Value  THN Long Term Goal   In 90 days the patient willverbalize that she has kept her blood pressure around 130/80  THN Long Term Goal Start Date  02/16/19  Interventions for Problem One Long Term Goal  Discussed bp readings, encouraged medication adherence, encouraged continued bp monitoring, encouraged the patient to talk with her physician about not being able to sleep.       Plan: RN Health Coach will contact patient in the month of December and patient agrees to next outreach.   Lazaro Arms RN, BSN, [place] Direct Dial:  [phone]  Fax: [phone]

## 2019-02-17 DIAGNOSIS — I639 Cerebral infarction, unspecified: Secondary | ICD-10-CM | POA: Diagnosis not present

## 2019-02-17 DIAGNOSIS — E118 Type 2 diabetes mellitus with unspecified complications: Secondary | ICD-10-CM | POA: Diagnosis not present

## 2019-02-17 DIAGNOSIS — M150 Primary generalized (osteo)arthritis: Secondary | ICD-10-CM | POA: Diagnosis not present

## 2019-02-18 DIAGNOSIS — I639 Cerebral infarction, unspecified: Secondary | ICD-10-CM | POA: Diagnosis not present

## 2019-02-18 DIAGNOSIS — E118 Type 2 diabetes mellitus with unspecified complications: Secondary | ICD-10-CM | POA: Diagnosis not present

## 2019-02-18 DIAGNOSIS — M150 Primary generalized (osteo)arthritis: Secondary | ICD-10-CM | POA: Diagnosis not present

## 2019-02-19 DIAGNOSIS — I639 Cerebral infarction, unspecified: Secondary | ICD-10-CM | POA: Diagnosis not present

## 2019-02-19 DIAGNOSIS — E118 Type 2 diabetes mellitus with unspecified complications: Secondary | ICD-10-CM | POA: Diagnosis not present

## 2019-02-19 DIAGNOSIS — M150 Primary generalized (osteo)arthritis: Secondary | ICD-10-CM | POA: Diagnosis not present

## 2019-02-20 ENCOUNTER — Other Ambulatory Visit: Payer: Self-pay | Admitting: Family Medicine

## 2019-02-20 DIAGNOSIS — M150 Primary generalized (osteo)arthritis: Secondary | ICD-10-CM | POA: Diagnosis not present

## 2019-02-20 DIAGNOSIS — I639 Cerebral infarction, unspecified: Secondary | ICD-10-CM | POA: Diagnosis not present

## 2019-02-20 DIAGNOSIS — E118 Type 2 diabetes mellitus with unspecified complications: Secondary | ICD-10-CM | POA: Diagnosis not present

## 2019-02-21 DIAGNOSIS — E118 Type 2 diabetes mellitus with unspecified complications: Secondary | ICD-10-CM | POA: Diagnosis not present

## 2019-02-21 DIAGNOSIS — I639 Cerebral infarction, unspecified: Secondary | ICD-10-CM | POA: Diagnosis not present

## 2019-02-21 DIAGNOSIS — M150 Primary generalized (osteo)arthritis: Secondary | ICD-10-CM | POA: Diagnosis not present

## 2019-02-22 ENCOUNTER — Other Ambulatory Visit: Payer: Self-pay

## 2019-02-22 DIAGNOSIS — E118 Type 2 diabetes mellitus with unspecified complications: Secondary | ICD-10-CM | POA: Diagnosis not present

## 2019-02-22 DIAGNOSIS — M150 Primary generalized (osteo)arthritis: Secondary | ICD-10-CM | POA: Diagnosis not present

## 2019-02-22 DIAGNOSIS — I639 Cerebral infarction, unspecified: Secondary | ICD-10-CM | POA: Diagnosis not present

## 2019-02-22 NOTE — Patient Outreach (Signed)
[facility]) Care Management  02/22/2019  Connie Ruiz 09/13/1960 [id number]   Medication Adherence call to Connie Ruiz Voice message left with a call back number. Connie Ruiz is showing past due on Metformin Er 500 mg under [place].   [place] Management Direct Dial [phone]  Fax [phone] [email or web address]

## 2019-02-23 DIAGNOSIS — E118 Type 2 diabetes mellitus with unspecified complications: Secondary | ICD-10-CM | POA: Diagnosis not present

## 2019-02-23 DIAGNOSIS — M150 Primary generalized (osteo)arthritis: Secondary | ICD-10-CM | POA: Diagnosis not present

## 2019-02-23 DIAGNOSIS — I639 Cerebral infarction, unspecified: Secondary | ICD-10-CM | POA: Diagnosis not present

## 2019-02-24 DIAGNOSIS — M150 Primary generalized (osteo)arthritis: Secondary | ICD-10-CM | POA: Diagnosis not present

## 2019-02-24 DIAGNOSIS — I639 Cerebral infarction, unspecified: Secondary | ICD-10-CM | POA: Diagnosis not present

## 2019-02-24 DIAGNOSIS — E118 Type 2 diabetes mellitus with unspecified complications: Secondary | ICD-10-CM | POA: Diagnosis not present

## 2019-02-25 DIAGNOSIS — M150 Primary generalized (osteo)arthritis: Secondary | ICD-10-CM | POA: Diagnosis not present

## 2019-02-25 DIAGNOSIS — I639 Cerebral infarction, unspecified: Secondary | ICD-10-CM | POA: Diagnosis not present

## 2019-02-25 DIAGNOSIS — E118 Type 2 diabetes mellitus with unspecified complications: Secondary | ICD-10-CM | POA: Diagnosis not present

## 2019-02-26 DIAGNOSIS — E118 Type 2 diabetes mellitus with unspecified complications: Secondary | ICD-10-CM | POA: Diagnosis not present

## 2019-02-26 DIAGNOSIS — I639 Cerebral infarction, unspecified: Secondary | ICD-10-CM | POA: Diagnosis not present

## 2019-02-26 DIAGNOSIS — M150 Primary generalized (osteo)arthritis: Secondary | ICD-10-CM | POA: Diagnosis not present

## 2019-02-27 DIAGNOSIS — M150 Primary generalized (osteo)arthritis: Secondary | ICD-10-CM | POA: Diagnosis not present

## 2019-02-27 DIAGNOSIS — E118 Type 2 diabetes mellitus with unspecified complications: Secondary | ICD-10-CM | POA: Diagnosis not present

## 2019-02-27 DIAGNOSIS — I639 Cerebral infarction, unspecified: Secondary | ICD-10-CM | POA: Diagnosis not present

## 2019-02-28 DIAGNOSIS — I639 Cerebral infarction, unspecified: Secondary | ICD-10-CM | POA: Diagnosis not present

## 2019-02-28 DIAGNOSIS — M150 Primary generalized (osteo)arthritis: Secondary | ICD-10-CM | POA: Diagnosis not present

## 2019-02-28 DIAGNOSIS — E118 Type 2 diabetes mellitus with unspecified complications: Secondary | ICD-10-CM | POA: Diagnosis not present

## 2019-03-01 DIAGNOSIS — I639 Cerebral infarction, unspecified: Secondary | ICD-10-CM | POA: Diagnosis not present

## 2019-03-01 DIAGNOSIS — E118 Type 2 diabetes mellitus with unspecified complications: Secondary | ICD-10-CM | POA: Diagnosis not present

## 2019-03-01 DIAGNOSIS — M150 Primary generalized (osteo)arthritis: Secondary | ICD-10-CM | POA: Diagnosis not present

## 2019-03-02 DIAGNOSIS — E118 Type 2 diabetes mellitus with unspecified complications: Secondary | ICD-10-CM | POA: Diagnosis not present

## 2019-03-02 DIAGNOSIS — I639 Cerebral infarction, unspecified: Secondary | ICD-10-CM | POA: Diagnosis not present

## 2019-03-02 DIAGNOSIS — M150 Primary generalized (osteo)arthritis: Secondary | ICD-10-CM | POA: Diagnosis not present

## 2019-03-03 DIAGNOSIS — M150 Primary generalized (osteo)arthritis: Secondary | ICD-10-CM | POA: Diagnosis not present

## 2019-03-03 DIAGNOSIS — I639 Cerebral infarction, unspecified: Secondary | ICD-10-CM | POA: Diagnosis not present

## 2019-03-03 DIAGNOSIS — E118 Type 2 diabetes mellitus with unspecified complications: Secondary | ICD-10-CM | POA: Diagnosis not present

## 2019-03-04 DIAGNOSIS — E118 Type 2 diabetes mellitus with unspecified complications: Secondary | ICD-10-CM | POA: Diagnosis not present

## 2019-03-04 DIAGNOSIS — M150 Primary generalized (osteo)arthritis: Secondary | ICD-10-CM | POA: Diagnosis not present

## 2019-03-04 DIAGNOSIS — I639 Cerebral infarction, unspecified: Secondary | ICD-10-CM | POA: Diagnosis not present

## 2019-03-05 DIAGNOSIS — M150 Primary generalized (osteo)arthritis: Secondary | ICD-10-CM | POA: Diagnosis not present

## 2019-03-05 DIAGNOSIS — E118 Type 2 diabetes mellitus with unspecified complications: Secondary | ICD-10-CM | POA: Diagnosis not present

## 2019-03-05 DIAGNOSIS — I639 Cerebral infarction, unspecified: Secondary | ICD-10-CM | POA: Diagnosis not present

## 2019-03-06 DIAGNOSIS — E118 Type 2 diabetes mellitus with unspecified complications: Secondary | ICD-10-CM | POA: Diagnosis not present

## 2019-03-06 DIAGNOSIS — M150 Primary generalized (osteo)arthritis: Secondary | ICD-10-CM | POA: Diagnosis not present

## 2019-03-06 DIAGNOSIS — I639 Cerebral infarction, unspecified: Secondary | ICD-10-CM | POA: Diagnosis not present

## 2019-03-07 DIAGNOSIS — E118 Type 2 diabetes mellitus with unspecified complications: Secondary | ICD-10-CM | POA: Diagnosis not present

## 2019-03-07 DIAGNOSIS — M150 Primary generalized (osteo)arthritis: Secondary | ICD-10-CM | POA: Diagnosis not present

## 2019-03-07 DIAGNOSIS — I639 Cerebral infarction, unspecified: Secondary | ICD-10-CM | POA: Diagnosis not present

## 2019-03-08 DIAGNOSIS — I639 Cerebral infarction, unspecified: Secondary | ICD-10-CM | POA: Diagnosis not present

## 2019-03-08 DIAGNOSIS — E118 Type 2 diabetes mellitus with unspecified complications: Secondary | ICD-10-CM | POA: Diagnosis not present

## 2019-03-08 DIAGNOSIS — M150 Primary generalized (osteo)arthritis: Secondary | ICD-10-CM | POA: Diagnosis not present

## 2019-03-09 DIAGNOSIS — M150 Primary generalized (osteo)arthritis: Secondary | ICD-10-CM | POA: Diagnosis not present

## 2019-03-09 DIAGNOSIS — I639 Cerebral infarction, unspecified: Secondary | ICD-10-CM | POA: Diagnosis not present

## 2019-03-09 DIAGNOSIS — E118 Type 2 diabetes mellitus with unspecified complications: Secondary | ICD-10-CM | POA: Diagnosis not present

## 2019-03-10 DIAGNOSIS — I639 Cerebral infarction, unspecified: Secondary | ICD-10-CM | POA: Diagnosis not present

## 2019-03-10 DIAGNOSIS — M150 Primary generalized (osteo)arthritis: Secondary | ICD-10-CM | POA: Diagnosis not present

## 2019-03-10 DIAGNOSIS — E118 Type 2 diabetes mellitus with unspecified complications: Secondary | ICD-10-CM | POA: Diagnosis not present

## 2019-03-11 DIAGNOSIS — E118 Type 2 diabetes mellitus with unspecified complications: Secondary | ICD-10-CM | POA: Diagnosis not present

## 2019-03-11 DIAGNOSIS — M150 Primary generalized (osteo)arthritis: Secondary | ICD-10-CM | POA: Diagnosis not present

## 2019-03-11 DIAGNOSIS — I639 Cerebral infarction, unspecified: Secondary | ICD-10-CM | POA: Diagnosis not present

## 2019-03-12 DIAGNOSIS — E118 Type 2 diabetes mellitus with unspecified complications: Secondary | ICD-10-CM | POA: Diagnosis not present

## 2019-03-12 DIAGNOSIS — I639 Cerebral infarction, unspecified: Secondary | ICD-10-CM | POA: Diagnosis not present

## 2019-03-12 DIAGNOSIS — M150 Primary generalized (osteo)arthritis: Secondary | ICD-10-CM | POA: Diagnosis not present

## 2019-03-13 DIAGNOSIS — I639 Cerebral infarction, unspecified: Secondary | ICD-10-CM | POA: Diagnosis not present

## 2019-03-13 DIAGNOSIS — E118 Type 2 diabetes mellitus with unspecified complications: Secondary | ICD-10-CM | POA: Diagnosis not present

## 2019-03-13 DIAGNOSIS — M150 Primary generalized (osteo)arthritis: Secondary | ICD-10-CM | POA: Diagnosis not present

## 2019-03-14 ENCOUNTER — Other Ambulatory Visit: Payer: Self-pay

## 2019-03-14 ENCOUNTER — Telehealth: Payer: Self-pay | Admitting: *Deleted

## 2019-03-14 DIAGNOSIS — E118 Type 2 diabetes mellitus with unspecified complications: Secondary | ICD-10-CM | POA: Diagnosis not present

## 2019-03-14 DIAGNOSIS — I639 Cerebral infarction, unspecified: Secondary | ICD-10-CM | POA: Diagnosis not present

## 2019-03-14 DIAGNOSIS — M150 Primary generalized (osteo)arthritis: Secondary | ICD-10-CM | POA: Diagnosis not present

## 2019-03-14 NOTE — Telephone Encounter (Signed)
Noted. Copied from [place] [id number]. Topic: General - Inquiry >> Mar 14, 2019  1:10 PM Selinda Flavin B, NT wrote: Reason for CRM: Patient's daughter, Selam Pietsch, calling and states that she just took her mother to the coumadin clinic at [facility] and they were very rude to her. Would like to know of another location that she may be able to take the patient to get levels checked. Please advise.  CB#: [phone] >> Mar 14, 2019  3:30 PM [place], Melburn Hake, CMA wrote: Coumadin is managed by cardiology, not PCP. TE in chart indicates they have already spoken with pt daughter about rescheduling appt.

## 2019-03-14 NOTE — Telephone Encounter (Signed)
Daughter asked for a call back regarding the pt being not being seen today because she felt that she was yelled at. The pt is on CVRR schedule for 145pm.  She stated that they arrived for the pt's 145pm appointment at 1pm and was told the pt had to remove her gloves she was wearing and sanitize her hands, then she was told she was too early for her appt, and then she was yelled at. I stated I am sorry and I advised her that wearing gloves can create more germs and we have to sanitize our hands when entering any of our [place] facilities. Also, stated to her that the staff downstairs assist in making sure pt' stay safe and reduce the spread of any germs; including that they press the buttons for those getting on the elevator and that we ask questions regarding COVID exposure before allowing pt's to come up stairs.  Also, explained to her the reason she could not come upstairs at 1pm for a 145pm appt and she verbalized understanding to all of this. Then she stated well no one explained any of this to me and my mom has dementia and it is getting worse I stated I am sorry about that. She stated that when she called the Coumadin Clinic office she felt yelled at and that the people outside the doors could hear the call and she wasn't on speaker phone. I apologized that she felt like she was being yelled at and advised by no means would we need to yell at her or anyone else as we value our pts and that we do speak loudly with our masks on because we feel our voice is lower, muffled and some of [place] do speak louder than others and I stated I am sorry she felt yelled at.  She stated I get it but today is not good she stated that she just can't come back today and her mom is not in a good mood so she will wait till she is in a better mood and ask when will be a better day. Advised that we can get her set up at her Cardiologist office at the [place] location as well and she stated she would be okay with either and she would  have to call back. Will wait for her to give [place] a call back and set an appt at either location.

## 2019-03-15 DIAGNOSIS — I639 Cerebral infarction, unspecified: Secondary | ICD-10-CM | POA: Diagnosis not present

## 2019-03-15 DIAGNOSIS — E118 Type 2 diabetes mellitus with unspecified complications: Secondary | ICD-10-CM | POA: Diagnosis not present

## 2019-03-15 DIAGNOSIS — M150 Primary generalized (osteo)arthritis: Secondary | ICD-10-CM | POA: Diagnosis not present

## 2019-03-16 DIAGNOSIS — M150 Primary generalized (osteo)arthritis: Secondary | ICD-10-CM | POA: Diagnosis not present

## 2019-03-16 DIAGNOSIS — E118 Type 2 diabetes mellitus with unspecified complications: Secondary | ICD-10-CM | POA: Diagnosis not present

## 2019-03-16 DIAGNOSIS — I639 Cerebral infarction, unspecified: Secondary | ICD-10-CM | POA: Diagnosis not present

## 2019-03-17 DIAGNOSIS — E118 Type 2 diabetes mellitus with unspecified complications: Secondary | ICD-10-CM | POA: Diagnosis not present

## 2019-03-17 DIAGNOSIS — I639 Cerebral infarction, unspecified: Secondary | ICD-10-CM | POA: Diagnosis not present

## 2019-03-17 DIAGNOSIS — M150 Primary generalized (osteo)arthritis: Secondary | ICD-10-CM | POA: Diagnosis not present

## 2019-03-18 DIAGNOSIS — E118 Type 2 diabetes mellitus with unspecified complications: Secondary | ICD-10-CM | POA: Diagnosis not present

## 2019-03-18 DIAGNOSIS — M150 Primary generalized (osteo)arthritis: Secondary | ICD-10-CM | POA: Diagnosis not present

## 2019-03-18 DIAGNOSIS — I639 Cerebral infarction, unspecified: Secondary | ICD-10-CM | POA: Diagnosis not present

## 2019-03-19 DIAGNOSIS — I639 Cerebral infarction, unspecified: Secondary | ICD-10-CM | POA: Diagnosis not present

## 2019-03-19 DIAGNOSIS — M150 Primary generalized (osteo)arthritis: Secondary | ICD-10-CM | POA: Diagnosis not present

## 2019-03-19 DIAGNOSIS — E118 Type 2 diabetes mellitus with unspecified complications: Secondary | ICD-10-CM | POA: Diagnosis not present

## 2019-03-20 DIAGNOSIS — I639 Cerebral infarction, unspecified: Secondary | ICD-10-CM | POA: Diagnosis not present

## 2019-03-20 DIAGNOSIS — M150 Primary generalized (osteo)arthritis: Secondary | ICD-10-CM | POA: Diagnosis not present

## 2019-03-20 DIAGNOSIS — E118 Type 2 diabetes mellitus with unspecified complications: Secondary | ICD-10-CM | POA: Diagnosis not present

## 2019-03-21 DIAGNOSIS — M150 Primary generalized (osteo)arthritis: Secondary | ICD-10-CM | POA: Diagnosis not present

## 2019-03-21 DIAGNOSIS — E118 Type 2 diabetes mellitus with unspecified complications: Secondary | ICD-10-CM | POA: Diagnosis not present

## 2019-03-21 DIAGNOSIS — I639 Cerebral infarction, unspecified: Secondary | ICD-10-CM | POA: Diagnosis not present

## 2019-03-22 ENCOUNTER — Other Ambulatory Visit: Payer: Self-pay | Admitting: Family Medicine

## 2019-03-22 DIAGNOSIS — I639 Cerebral infarction, unspecified: Secondary | ICD-10-CM | POA: Diagnosis not present

## 2019-03-22 DIAGNOSIS — M150 Primary generalized (osteo)arthritis: Secondary | ICD-10-CM | POA: Diagnosis not present

## 2019-03-22 DIAGNOSIS — E118 Type 2 diabetes mellitus with unspecified complications: Secondary | ICD-10-CM | POA: Diagnosis not present

## 2019-03-23 DIAGNOSIS — M150 Primary generalized (osteo)arthritis: Secondary | ICD-10-CM | POA: Diagnosis not present

## 2019-03-23 DIAGNOSIS — I639 Cerebral infarction, unspecified: Secondary | ICD-10-CM | POA: Diagnosis not present

## 2019-03-23 DIAGNOSIS — E118 Type 2 diabetes mellitus with unspecified complications: Secondary | ICD-10-CM | POA: Diagnosis not present

## 2019-03-24 DIAGNOSIS — I639 Cerebral infarction, unspecified: Secondary | ICD-10-CM | POA: Diagnosis not present

## 2019-03-24 DIAGNOSIS — E118 Type 2 diabetes mellitus with unspecified complications: Secondary | ICD-10-CM | POA: Diagnosis not present

## 2019-03-24 DIAGNOSIS — M150 Primary generalized (osteo)arthritis: Secondary | ICD-10-CM | POA: Diagnosis not present

## 2019-03-25 DIAGNOSIS — I639 Cerebral infarction, unspecified: Secondary | ICD-10-CM | POA: Diagnosis not present

## 2019-03-25 DIAGNOSIS — E118 Type 2 diabetes mellitus with unspecified complications: Secondary | ICD-10-CM | POA: Diagnosis not present

## 2019-03-25 DIAGNOSIS — M150 Primary generalized (osteo)arthritis: Secondary | ICD-10-CM | POA: Diagnosis not present

## 2019-03-26 DIAGNOSIS — I639 Cerebral infarction, unspecified: Secondary | ICD-10-CM | POA: Diagnosis not present

## 2019-03-26 DIAGNOSIS — M150 Primary generalized (osteo)arthritis: Secondary | ICD-10-CM | POA: Diagnosis not present

## 2019-03-26 DIAGNOSIS — E118 Type 2 diabetes mellitus with unspecified complications: Secondary | ICD-10-CM | POA: Diagnosis not present

## 2019-03-27 DIAGNOSIS — I639 Cerebral infarction, unspecified: Secondary | ICD-10-CM | POA: Diagnosis not present

## 2019-03-27 DIAGNOSIS — E118 Type 2 diabetes mellitus with unspecified complications: Secondary | ICD-10-CM | POA: Diagnosis not present

## 2019-03-27 DIAGNOSIS — M150 Primary generalized (osteo)arthritis: Secondary | ICD-10-CM | POA: Diagnosis not present

## 2019-03-28 DIAGNOSIS — E118 Type 2 diabetes mellitus with unspecified complications: Secondary | ICD-10-CM | POA: Diagnosis not present

## 2019-03-28 DIAGNOSIS — M150 Primary generalized (osteo)arthritis: Secondary | ICD-10-CM | POA: Diagnosis not present

## 2019-03-28 DIAGNOSIS — I639 Cerebral infarction, unspecified: Secondary | ICD-10-CM | POA: Diagnosis not present

## 2019-03-29 DIAGNOSIS — M150 Primary generalized (osteo)arthritis: Secondary | ICD-10-CM | POA: Diagnosis not present

## 2019-03-29 DIAGNOSIS — I639 Cerebral infarction, unspecified: Secondary | ICD-10-CM | POA: Diagnosis not present

## 2019-03-29 DIAGNOSIS — E118 Type 2 diabetes mellitus with unspecified complications: Secondary | ICD-10-CM | POA: Diagnosis not present

## 2019-03-30 DIAGNOSIS — I639 Cerebral infarction, unspecified: Secondary | ICD-10-CM | POA: Diagnosis not present

## 2019-03-30 DIAGNOSIS — E118 Type 2 diabetes mellitus with unspecified complications: Secondary | ICD-10-CM | POA: Diagnosis not present

## 2019-03-30 DIAGNOSIS — M150 Primary generalized (osteo)arthritis: Secondary | ICD-10-CM | POA: Diagnosis not present

## 2019-03-31 DIAGNOSIS — E118 Type 2 diabetes mellitus with unspecified complications: Secondary | ICD-10-CM | POA: Diagnosis not present

## 2019-03-31 DIAGNOSIS — I639 Cerebral infarction, unspecified: Secondary | ICD-10-CM | POA: Diagnosis not present

## 2019-03-31 DIAGNOSIS — M150 Primary generalized (osteo)arthritis: Secondary | ICD-10-CM | POA: Diagnosis not present

## 2019-04-01 DIAGNOSIS — I639 Cerebral infarction, unspecified: Secondary | ICD-10-CM | POA: Diagnosis not present

## 2019-04-01 DIAGNOSIS — E118 Type 2 diabetes mellitus with unspecified complications: Secondary | ICD-10-CM | POA: Diagnosis not present

## 2019-04-01 DIAGNOSIS — M150 Primary generalized (osteo)arthritis: Secondary | ICD-10-CM | POA: Diagnosis not present

## 2019-04-02 DIAGNOSIS — M150 Primary generalized (osteo)arthritis: Secondary | ICD-10-CM | POA: Diagnosis not present

## 2019-04-02 DIAGNOSIS — E118 Type 2 diabetes mellitus with unspecified complications: Secondary | ICD-10-CM | POA: Diagnosis not present

## 2019-04-02 DIAGNOSIS — I639 Cerebral infarction, unspecified: Secondary | ICD-10-CM | POA: Diagnosis not present

## 2019-04-03 DIAGNOSIS — M150 Primary generalized (osteo)arthritis: Secondary | ICD-10-CM | POA: Diagnosis not present

## 2019-04-03 DIAGNOSIS — E118 Type 2 diabetes mellitus with unspecified complications: Secondary | ICD-10-CM | POA: Diagnosis not present

## 2019-04-03 DIAGNOSIS — I639 Cerebral infarction, unspecified: Secondary | ICD-10-CM | POA: Diagnosis not present

## 2019-04-04 DIAGNOSIS — E118 Type 2 diabetes mellitus with unspecified complications: Secondary | ICD-10-CM | POA: Diagnosis not present

## 2019-04-04 DIAGNOSIS — M150 Primary generalized (osteo)arthritis: Secondary | ICD-10-CM | POA: Diagnosis not present

## 2019-04-04 DIAGNOSIS — I639 Cerebral infarction, unspecified: Secondary | ICD-10-CM | POA: Diagnosis not present

## 2019-04-05 DIAGNOSIS — E118 Type 2 diabetes mellitus with unspecified complications: Secondary | ICD-10-CM | POA: Diagnosis not present

## 2019-04-05 DIAGNOSIS — M150 Primary generalized (osteo)arthritis: Secondary | ICD-10-CM | POA: Diagnosis not present

## 2019-04-05 DIAGNOSIS — I639 Cerebral infarction, unspecified: Secondary | ICD-10-CM | POA: Diagnosis not present

## 2019-04-06 DIAGNOSIS — M150 Primary generalized (osteo)arthritis: Secondary | ICD-10-CM | POA: Diagnosis not present

## 2019-04-06 DIAGNOSIS — I639 Cerebral infarction, unspecified: Secondary | ICD-10-CM | POA: Diagnosis not present

## 2019-04-06 DIAGNOSIS — E118 Type 2 diabetes mellitus with unspecified complications: Secondary | ICD-10-CM | POA: Diagnosis not present

## 2019-04-07 DIAGNOSIS — M150 Primary generalized (osteo)arthritis: Secondary | ICD-10-CM | POA: Diagnosis not present

## 2019-04-07 DIAGNOSIS — I639 Cerebral infarction, unspecified: Secondary | ICD-10-CM | POA: Diagnosis not present

## 2019-04-07 DIAGNOSIS — E118 Type 2 diabetes mellitus with unspecified complications: Secondary | ICD-10-CM | POA: Diagnosis not present

## 2019-04-08 DIAGNOSIS — E118 Type 2 diabetes mellitus with unspecified complications: Secondary | ICD-10-CM | POA: Diagnosis not present

## 2019-04-08 DIAGNOSIS — M150 Primary generalized (osteo)arthritis: Secondary | ICD-10-CM | POA: Diagnosis not present

## 2019-04-08 DIAGNOSIS — I639 Cerebral infarction, unspecified: Secondary | ICD-10-CM | POA: Diagnosis not present

## 2019-04-09 DIAGNOSIS — I639 Cerebral infarction, unspecified: Secondary | ICD-10-CM | POA: Diagnosis not present

## 2019-04-09 DIAGNOSIS — E118 Type 2 diabetes mellitus with unspecified complications: Secondary | ICD-10-CM | POA: Diagnosis not present

## 2019-04-09 DIAGNOSIS — M150 Primary generalized (osteo)arthritis: Secondary | ICD-10-CM | POA: Diagnosis not present

## 2019-04-10 ENCOUNTER — Telehealth: Payer: Self-pay

## 2019-04-10 DIAGNOSIS — I639 Cerebral infarction, unspecified: Secondary | ICD-10-CM | POA: Diagnosis not present

## 2019-04-10 DIAGNOSIS — E118 Type 2 diabetes mellitus with unspecified complications: Secondary | ICD-10-CM | POA: Diagnosis not present

## 2019-04-10 DIAGNOSIS — M150 Primary generalized (osteo)arthritis: Secondary | ICD-10-CM | POA: Diagnosis not present

## 2019-04-10 NOTE — Telephone Encounter (Signed)
Per Dr. Loanne Drilling, unable to refill Calcitrol without an appt. Routing this message to the front desk for scheduling purposes.

## 2019-04-11 DIAGNOSIS — M150 Primary generalized (osteo)arthritis: Secondary | ICD-10-CM | POA: Diagnosis not present

## 2019-04-11 DIAGNOSIS — I639 Cerebral infarction, unspecified: Secondary | ICD-10-CM | POA: Diagnosis not present

## 2019-04-11 DIAGNOSIS — E118 Type 2 diabetes mellitus with unspecified complications: Secondary | ICD-10-CM | POA: Diagnosis not present

## 2019-04-12 DIAGNOSIS — I639 Cerebral infarction, unspecified: Secondary | ICD-10-CM | POA: Diagnosis not present

## 2019-04-12 DIAGNOSIS — E118 Type 2 diabetes mellitus with unspecified complications: Secondary | ICD-10-CM | POA: Diagnosis not present

## 2019-04-12 DIAGNOSIS — M150 Primary generalized (osteo)arthritis: Secondary | ICD-10-CM | POA: Diagnosis not present

## 2019-04-12 NOTE — Progress Notes (Deleted)
NEUROLOGY FOLLOW UP OFFICE NOTE  Connie Ruiz [id number]  HISTORY OF PRESENT ILLNESS: Connie Ruiz is a 58 year old left-handed woman with antiphospholipid syndrome (on Coumadin), hypertension, type 2 diabetes mellitus, left thyroid nodule, hyperlipidemia, CHF, renal transplant on immunosuppressants, lupus, osteoporosis, and history of stroke, DVT, and pulmonary embolism who follows up for vascular dementia.  She is accompanied by her daughter and caregiver, who supplement history.  UPDATE:  She is taking Aricept 10 mg daily.  No significant changes since last visit.  She is still living in her own apartment.  Her caregiver comes to the apartment daily to help with laundry, cleaning, and assisting with ADLs.  She manages her own medications via pillbox.  HISTORY:  She has history of significant cerebrovascular disease and stroke.  For several years, she has had memory problems.  For example, she would often forget having just seen her children from time to time.  She also expressed that she feels people do not care about her.  She does not drive.  Since her stroke, she requires assistance in regards to her ADLs.  Home health aide helps her with dressing and bathing.  She has a pillbox.  Her daughter handles her finances.  She is followed for her lupus by rheumatology at [facility].  Prior medications: Seroquel (effective for agitation), mirtazapine  MRI of the head from 06/14/13 showed remote left MCA infarct but no acute findings. MRA of the head showed atherosclerotic changes in the anterior and posterior circulation with partial recanalization of the left MCA.  PAST MEDICAL HISTORY: Past Medical History:  Diagnosis Date  . Anxiety   . Candida esophagitis ([place]) 11/12/2014  . CEREBROVASCULAR ACCIDENT, ACUTE 04/15/2010  . CLOSTRIDIUM DIFFICILE COLITIS, HX OF 08/21/2007  . CONGESTIVE HEART FAILURE 08/21/2007  . Current use of long term anticoagulation    Dr. Andree Elk, [facility]  . CVA  04/17/2010  . Depression    Dr. Andree Elk, [place], TYPE II 08/21/2007  . DVT, HX OF 08/21/2007  . GERD 08/21/2007  . GOUT 08/21/2007  . History of stroke with residual effects   . HYPERLIPIDEMIA 08/21/2007  . HYPERTENSION 08/21/2007   Dr. Andree Elk, [place], HX OF 08/22/2007   s/p renal transplant-Dr. Andree Elk, [place] 08/21/2007  . OSTEOPOROSIS 08/21/2007   Rheumatol at [facility]  . Pulmonary embolism ([place]) 07/16/2010  . Renal failure   . RENAL INSUFFICIENCY 08/21/2007  . Right sided weakness   . Steroid-induced hyperglycemia 11/09/2014  . Tachycardia   . THYROID NODULE, LEFT 04/10/2009    MEDICATIONS: Current Outpatient Medications on File Prior to Visit  Medication Sig Dispense Refill  . acetaminophen (TYLENOL) 500 MG tablet Take 1,000 mg by mouth daily as needed for mild pain.    Marland Kitchen alendronate (FOSAMAX) 70 MG tablet TAKE 1 TABLET BY MOUTH ONCE A WEEK ON AN EMPTY STOMACH ON SATURDAYS WITH A FULL GLASS OF WATER. (Patient taking differently: Take 70 mg by mouth once a week. ) 12 tablet 0  . atorvastatin (LIPITOR) 40 MG tablet Take 40 mg by mouth daily at 6 PM.     . Blood Glucose Monitoring Suppl (ACCU-CHEK AVIVA PLUS) w/Device KIT 1 Device by Does not apply route daily. 1 kit 1  . calcitRIOL (ROCALTROL) 0.25 MCG capsule TAKE 1 CAPSULE(0.25 MCG) BY MOUTH DAILY. NO REFILLS WITHOUT APPOINTMENT 30 capsule 0  . diltiazem (TIAZAC) 360 MG 24 hr capsule Take 1 capsule (360 mg total) by mouth daily. 90 capsule  1  . donepezil (ARICEPT) 10 MG tablet Take 10 mg by mouth at bedtime.     Marland Kitchen esomeprazole (NEXIUM) 20 MG capsule TAKE 1 CAPSULE(20 MG) BY MOUTH DAILY (Patient taking differently: Take 20 mg by mouth daily at 12 noon. ) 30 capsule 11  . feeding supplement, ENSURE ENLIVE, (ENSURE ENLIVE) LIQD Take 237 mLs by mouth 2 (two) times daily between meals.    Marland Kitchen glucose blood (ONE TOUCH ULTRA TEST) test strip Use to check blood sugar 1 time per day 100 each 2  .  levothyroxine (SYNTHROID) 175 MCG tablet TAKE 1 TABLET(175 MCG) BY MOUTH DAILY BEFORE BREAKFAST 30 tablet 0  . losartan (COZAAR) 100 MG tablet Take 1 tablet (100 mg total) by mouth daily. 90 tablet 1  . magnesium oxide (MAG-OX) 400 MG tablet Take 800 mg by mouth daily.     . metFORMIN (GLUCOPHAGE-XR) 500 MG 24 hr tablet TAKE 1 TABLET(500 MG) BY MOUTH DAILY WITH BREAKFAST 90 tablet 0  . mirtazapine (REMERON) 30 MG tablet Take 1 tablet (30 mg total) by mouth at bedtime. 30 tablet 1  . mycophenolate (CELLCEPT) 250 MG capsule TAKE ONE CAPSULE BY MOUTH TWICE DAILY    . nystatin (MYCOSTATIN) powder Apply 1 g topically 4 (four) times daily as needed. For yeast under breast    . ondansetron (ZOFRAN ODT) 4 MG disintegrating tablet 15m ODT q4 hours prn nausea/vomiting 15 tablet 0  . ONETOUCH DELICA LANCETS 333AMISC Use to check blood sugar 1 time per day 100 each 2  . oxybutynin (DITROPAN) 5 MG tablet Take 5 mg by mouth daily as needed for bladder spasms.     . Potassium Chloride ER 20 MEQ TBCR Take 1 tablet by mouth daily. NEEDS POTASSIUM RE-CHECK. 90 tablet 0  . predniSONE (DELTASONE) 5 MG tablet Take 5 mg by mouth daily with breakfast.     . QUEtiapine (SEROQUEL) 25 MG tablet Take 1 tablet (25 mg total) by mouth at bedtime. 30 tablet 1  . sertraline (ZOLOFT) 100 MG tablet Take 0.5 tablets (50 mg total) by mouth daily. 15 tablet 2  . sucralfate (CARAFATE) 1 g tablet Take 1 tablet (1 g total) by mouth 4 (four) times daily for 30 days.    .Marland Kitchentacrolimus (PROGRAF) 1 MG capsule Take 1-2 mg by mouth See admin instructions. 2 mg in the am and 1 mg at night    . warfarin (COUMADIN) 5 MG tablet TAKE 1/2 TO 1 TABLET BY MOUTH ON MONDAY THROUGH FRIDAY; TUESDAY, WEDNESDAY, THURSDAY, SATAND SUNDAY-2.5MG 50 tablet 0   No current facility-administered medications on file prior to visit.     ALLERGIES: Allergies  Allergen Reactions  . Oxycodone-Acetaminophen Shortness Of Breath and Nausea Only  . Propoxyphene Nausea  Only and Shortness Of Breath    States takes tylenol at home  . Propoxyphene N-Acetaminophen Shortness Of Breath and Nausea Only  . Sulfonamide Derivatives Shortness Of Breath and Nausea Only  . Codeine Nausea Only  . Hydrocodone-Acetaminophen   . Hydromorphone   . Other Other (See Comments)    No blood, Jehovaeh Witness   . Oxycodone   . Sulfamethoxazole   . Tape     Redness**PAPER TAPE OK**  . Gabapentin Anxiety    twitching  . Latex Rash  . Metoprolol Rash  . Morphine And Related Rash    IV site on arm is red, patient reports this is improving.  NO shortness of breath reported.  . Rosiglitazone Rash    FAMILY  HISTORY: Family History  Problem Relation Age of Onset  . Heart attack Mother   . Heart disease Father   . Asthma Sister   . Asthma Daughter   . Cancer Maternal Grandfather        prostate  . Cancer Paternal Grandfather        colon   SOCIAL HISTORY: Social History   Socioeconomic History  . Marital status: Divorced    Spouse name: Not on file  . Number of children: 1  . Years of education: Not on file  . Highest education level: Not on file  Occupational History  . Occupation: DISABILITY    Employer: UNEMPLOYED  Social Needs  . Financial resource strain: Not on file  . Food insecurity    Worry: Not on file    Inability: Not on file  . Transportation needs    Medical: Not on file    Non-medical: Not on file  Tobacco Use  . Smoking status: Never Smoker  . Smokeless tobacco: Never Used  Substance and Sexual Activity  . Alcohol use: No    Alcohol/week: 0.0 standard drinks  . Drug use: No  . Sexual activity: Not Currently    Partners: Male    Birth control/protection: Post-menopausal  Lifestyle  . Physical activity    Days per week: Not on file    Minutes per session: Not on file  . Stress: Not on file  Relationships  . Social Herbalist on phone: Not on file    Gets together: Not on file    Attends religious service: Not on file     Active member of club or organization: Not on file    Attends meetings of clubs or organizations: Not on file    Relationship status: Not on file  . Intimate partner violence    Fear of current or ex partner: Not on file    Emotionally abused: Not on file    Physically abused: Not on file    Forced sexual activity: Not on file  Other Topics Concern  . Not on file  Social History Narrative   Retired   Regular exercise-no   Pt completed hs.     REVIEW OF SYSTEMS: Constitutional: No fevers, chills, or sweats, no generalized fatigue, change in appetite Eyes: No visual changes, double vision, eye pain Ear, nose and throat: No hearing loss, ear pain, nasal congestion, sore throat Cardiovascular: No chest pain, palpitations Respiratory:  No shortness of breath at rest or with exertion, wheezes GastrointestinaI: No nausea, vomiting, diarrhea, abdominal pain, fecal incontinence Genitourinary:  No dysuria, urinary retention or frequency Musculoskeletal:  No neck pain, back pain Integumentary: No rash, pruritus, skin lesions Neurological: as above Psychiatric: No depression, insomnia, anxiety Endocrine: No palpitations, fatigue, diaphoresis, mood swings, change in appetite, change in weight, increased thirst Hematologic/Lymphatic:  No purpura, petechiae. Allergic/Immunologic: no itchy/runny eyes, nasal congestion, recent allergic reactions, rashes  PHYSICAL EXAM: *** General: No acute distress.  Patient appears well-groomed.   Head:  Normocephalic/atraumatic Eyes:  Fundi examined but not visualized Neck: supple, no paraspinal tenderness, full range of motion Heart:  Regular rate and rhythm Lungs:  Clear to auscultation bilaterally Back: No paraspinal tenderness Neurological Exam: alert and oriented to person, place, and time. Attention span and concentration reduced, delayed recall reduced; remote memory intact, fund of knowledge intact.  Speech and language aphasic with some  hesitancy in speech output and naming fluency but able to follow commands.  Right ptosis.  Reduced facial sensation on right V1-V3, mild right lower facial weakness.  Otherwise, CN II-XII intact. Bulk and tone normal, 5-/5 right upper extremity and hip flexion.  Otherwise, muscle strength 5/5 throughout.  Sensation to light touch, temperature and vibration intact.  Deep tendon reflexes 2+ throughout, toes downgoing.  Finger to nose  testing intact.  Wide-based wobbling gait, Romberg negative.  IMPRESSION: 1.  Vascular dementia 2.  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  Type 2 diabetes mellitus 4.  Essential hypertension 5.  Hyperlipidemia 6.  Depression  PLAN: 1.  Aricept 33m at bedtime 2.  Continue anticoagulation 3.  Continue Lipitor 40 mg (LDL goal less than 70) 4.  Blood pressure and glycemic control. 5.  Advised that she should not drive. 6.  Follow-up in 1 year  AMetta Clines DO  CC: ***

## 2019-04-13 DIAGNOSIS — I639 Cerebral infarction, unspecified: Secondary | ICD-10-CM | POA: Diagnosis not present

## 2019-04-13 DIAGNOSIS — M150 Primary generalized (osteo)arthritis: Secondary | ICD-10-CM | POA: Diagnosis not present

## 2019-04-13 DIAGNOSIS — E118 Type 2 diabetes mellitus with unspecified complications: Secondary | ICD-10-CM | POA: Diagnosis not present

## 2019-04-14 DIAGNOSIS — E118 Type 2 diabetes mellitus with unspecified complications: Secondary | ICD-10-CM | POA: Diagnosis not present

## 2019-04-14 DIAGNOSIS — I639 Cerebral infarction, unspecified: Secondary | ICD-10-CM | POA: Diagnosis not present

## 2019-04-14 DIAGNOSIS — M150 Primary generalized (osteo)arthritis: Secondary | ICD-10-CM | POA: Diagnosis not present

## 2019-04-15 DIAGNOSIS — M150 Primary generalized (osteo)arthritis: Secondary | ICD-10-CM | POA: Diagnosis not present

## 2019-04-15 DIAGNOSIS — E118 Type 2 diabetes mellitus with unspecified complications: Secondary | ICD-10-CM | POA: Diagnosis not present

## 2019-04-15 DIAGNOSIS — I639 Cerebral infarction, unspecified: Secondary | ICD-10-CM | POA: Diagnosis not present

## 2019-04-16 ENCOUNTER — Ambulatory Visit: Payer: Medicare Other | Admitting: Neurology

## 2019-04-16 DIAGNOSIS — Z029 Encounter for administrative examinations, unspecified: Secondary | ICD-10-CM

## 2019-04-16 DIAGNOSIS — I639 Cerebral infarction, unspecified: Secondary | ICD-10-CM | POA: Diagnosis not present

## 2019-04-16 DIAGNOSIS — M150 Primary generalized (osteo)arthritis: Secondary | ICD-10-CM | POA: Diagnosis not present

## 2019-04-16 DIAGNOSIS — E118 Type 2 diabetes mellitus with unspecified complications: Secondary | ICD-10-CM | POA: Diagnosis not present

## 2019-04-17 DIAGNOSIS — M150 Primary generalized (osteo)arthritis: Secondary | ICD-10-CM | POA: Diagnosis not present

## 2019-04-17 DIAGNOSIS — E118 Type 2 diabetes mellitus with unspecified complications: Secondary | ICD-10-CM | POA: Diagnosis not present

## 2019-04-17 DIAGNOSIS — I639 Cerebral infarction, unspecified: Secondary | ICD-10-CM | POA: Diagnosis not present

## 2019-04-17 NOTE — Telephone Encounter (Signed)
Patients daughter states that patient no longer wants to be seen but will contact us back soon

## 2019-04-18 DIAGNOSIS — E118 Type 2 diabetes mellitus with unspecified complications: Secondary | ICD-10-CM | POA: Diagnosis not present

## 2019-04-18 DIAGNOSIS — M150 Primary generalized (osteo)arthritis: Secondary | ICD-10-CM | POA: Diagnosis not present

## 2019-04-18 DIAGNOSIS — I639 Cerebral infarction, unspecified: Secondary | ICD-10-CM | POA: Diagnosis not present

## 2019-04-19 DIAGNOSIS — E118 Type 2 diabetes mellitus with unspecified complications: Secondary | ICD-10-CM | POA: Diagnosis not present

## 2019-04-19 DIAGNOSIS — I639 Cerebral infarction, unspecified: Secondary | ICD-10-CM | POA: Diagnosis not present

## 2019-04-19 DIAGNOSIS — M150 Primary generalized (osteo)arthritis: Secondary | ICD-10-CM | POA: Diagnosis not present

## 2019-04-20 DIAGNOSIS — M150 Primary generalized (osteo)arthritis: Secondary | ICD-10-CM | POA: Diagnosis not present

## 2019-04-20 DIAGNOSIS — E118 Type 2 diabetes mellitus with unspecified complications: Secondary | ICD-10-CM | POA: Diagnosis not present

## 2019-04-20 DIAGNOSIS — I639 Cerebral infarction, unspecified: Secondary | ICD-10-CM | POA: Diagnosis not present

## 2019-04-21 DIAGNOSIS — E118 Type 2 diabetes mellitus with unspecified complications: Secondary | ICD-10-CM | POA: Diagnosis not present

## 2019-04-21 DIAGNOSIS — I639 Cerebral infarction, unspecified: Secondary | ICD-10-CM | POA: Diagnosis not present

## 2019-04-21 DIAGNOSIS — M150 Primary generalized (osteo)arthritis: Secondary | ICD-10-CM | POA: Diagnosis not present

## 2019-04-22 DIAGNOSIS — E118 Type 2 diabetes mellitus with unspecified complications: Secondary | ICD-10-CM | POA: Diagnosis not present

## 2019-04-22 DIAGNOSIS — I639 Cerebral infarction, unspecified: Secondary | ICD-10-CM | POA: Diagnosis not present

## 2019-04-22 DIAGNOSIS — M150 Primary generalized (osteo)arthritis: Secondary | ICD-10-CM | POA: Diagnosis not present

## 2019-04-23 ENCOUNTER — Encounter: Payer: Self-pay | Admitting: Neurology

## 2019-04-23 DIAGNOSIS — I639 Cerebral infarction, unspecified: Secondary | ICD-10-CM | POA: Diagnosis not present

## 2019-04-23 DIAGNOSIS — M150 Primary generalized (osteo)arthritis: Secondary | ICD-10-CM | POA: Diagnosis not present

## 2019-04-23 DIAGNOSIS — E118 Type 2 diabetes mellitus with unspecified complications: Secondary | ICD-10-CM | POA: Diagnosis not present

## 2019-04-24 ENCOUNTER — Telehealth: Payer: Self-pay | Admitting: Neurology

## 2019-04-24 DIAGNOSIS — I639 Cerebral infarction, unspecified: Secondary | ICD-10-CM | POA: Diagnosis not present

## 2019-04-24 DIAGNOSIS — M150 Primary generalized (osteo)arthritis: Secondary | ICD-10-CM | POA: Diagnosis not present

## 2019-04-24 DIAGNOSIS — E118 Type 2 diabetes mellitus with unspecified complications: Secondary | ICD-10-CM | POA: Diagnosis not present

## 2019-04-24 NOTE — Telephone Encounter (Signed)
Patient dismissed from [facility] Neurology by Metta Clines DO, effective 04/23/19. Dismissal Letter sent out by 1st class mail. [employer]

## 2019-04-25 DIAGNOSIS — R197 Diarrhea, unspecified: Secondary | ICD-10-CM | POA: Diagnosis not present

## 2019-04-25 DIAGNOSIS — Z7952 Long term (current) use of systemic steroids: Secondary | ICD-10-CM | POA: Diagnosis not present

## 2019-04-25 DIAGNOSIS — Z9089 Acquired absence of other organs: Secondary | ICD-10-CM | POA: Diagnosis not present

## 2019-04-25 DIAGNOSIS — E891 Postprocedural hypoinsulinemia: Secondary | ICD-10-CM | POA: Diagnosis not present

## 2019-04-25 DIAGNOSIS — Z940 Kidney transplant status: Secondary | ICD-10-CM | POA: Diagnosis not present

## 2019-04-25 DIAGNOSIS — E785 Hyperlipidemia, unspecified: Secondary | ICD-10-CM | POA: Diagnosis not present

## 2019-04-25 DIAGNOSIS — Z4822 Encounter for aftercare following kidney transplant: Secondary | ICD-10-CM | POA: Diagnosis not present

## 2019-04-25 DIAGNOSIS — Z8673 Personal history of transient ischemic attack (TIA), and cerebral infarction without residual deficits: Secondary | ICD-10-CM | POA: Diagnosis not present

## 2019-04-25 DIAGNOSIS — M150 Primary generalized (osteo)arthritis: Secondary | ICD-10-CM | POA: Diagnosis not present

## 2019-04-25 DIAGNOSIS — R109 Unspecified abdominal pain: Secondary | ICD-10-CM | POA: Diagnosis not present

## 2019-04-25 DIAGNOSIS — I639 Cerebral infarction, unspecified: Secondary | ICD-10-CM | POA: Diagnosis not present

## 2019-04-25 DIAGNOSIS — M109 Gout, unspecified: Secondary | ICD-10-CM | POA: Diagnosis not present

## 2019-04-25 DIAGNOSIS — M329 Systemic lupus erythematosus, unspecified: Secondary | ICD-10-CM | POA: Diagnosis not present

## 2019-04-25 DIAGNOSIS — Z7901 Long term (current) use of anticoagulants: Secondary | ICD-10-CM | POA: Diagnosis not present

## 2019-04-25 DIAGNOSIS — Z79899 Other long term (current) drug therapy: Secondary | ICD-10-CM | POA: Diagnosis not present

## 2019-04-25 DIAGNOSIS — I10 Essential (primary) hypertension: Secondary | ICD-10-CM | POA: Diagnosis not present

## 2019-04-25 DIAGNOSIS — E118 Type 2 diabetes mellitus with unspecified complications: Secondary | ICD-10-CM | POA: Diagnosis not present

## 2019-04-26 DIAGNOSIS — I639 Cerebral infarction, unspecified: Secondary | ICD-10-CM | POA: Diagnosis not present

## 2019-04-26 DIAGNOSIS — M150 Primary generalized (osteo)arthritis: Secondary | ICD-10-CM | POA: Diagnosis not present

## 2019-04-26 DIAGNOSIS — E118 Type 2 diabetes mellitus with unspecified complications: Secondary | ICD-10-CM | POA: Diagnosis not present

## 2019-04-27 DIAGNOSIS — E118 Type 2 diabetes mellitus with unspecified complications: Secondary | ICD-10-CM | POA: Diagnosis not present

## 2019-04-27 DIAGNOSIS — M150 Primary generalized (osteo)arthritis: Secondary | ICD-10-CM | POA: Diagnosis not present

## 2019-04-27 DIAGNOSIS — I639 Cerebral infarction, unspecified: Secondary | ICD-10-CM | POA: Diagnosis not present

## 2019-04-28 DIAGNOSIS — E118 Type 2 diabetes mellitus with unspecified complications: Secondary | ICD-10-CM | POA: Diagnosis not present

## 2019-04-28 DIAGNOSIS — M150 Primary generalized (osteo)arthritis: Secondary | ICD-10-CM | POA: Diagnosis not present

## 2019-04-28 DIAGNOSIS — I639 Cerebral infarction, unspecified: Secondary | ICD-10-CM | POA: Diagnosis not present

## 2019-04-29 DIAGNOSIS — E118 Type 2 diabetes mellitus with unspecified complications: Secondary | ICD-10-CM | POA: Diagnosis not present

## 2019-04-29 DIAGNOSIS — I639 Cerebral infarction, unspecified: Secondary | ICD-10-CM | POA: Diagnosis not present

## 2019-04-29 DIAGNOSIS — M150 Primary generalized (osteo)arthritis: Secondary | ICD-10-CM | POA: Diagnosis not present

## 2019-04-30 ENCOUNTER — Telehealth: Payer: Self-pay

## 2019-04-30 DIAGNOSIS — E118 Type 2 diabetes mellitus with unspecified complications: Secondary | ICD-10-CM | POA: Diagnosis not present

## 2019-04-30 DIAGNOSIS — I2510 Atherosclerotic heart disease of native coronary artery without angina pectoris: Secondary | ICD-10-CM

## 2019-04-30 DIAGNOSIS — F0151 Vascular dementia with behavioral disturbance: Secondary | ICD-10-CM

## 2019-04-30 DIAGNOSIS — M150 Primary generalized (osteo)arthritis: Secondary | ICD-10-CM | POA: Diagnosis not present

## 2019-04-30 DIAGNOSIS — F01518 Vascular dementia, unspecified severity, with other behavioral disturbance: Secondary | ICD-10-CM

## 2019-04-30 DIAGNOSIS — I639 Cerebral infarction, unspecified: Secondary | ICD-10-CM | POA: Diagnosis not present

## 2019-04-30 NOTE — Telephone Encounter (Signed)
Ok to place referral.

## 2019-04-30 NOTE — Telephone Encounter (Signed)
Copied from [place] [id number]. Topic: Referral - Request for Referral >> Apr 30, 2019 12:31 PM Percell Belt A wrote: Has patient seen PCP for this complaint? Yes.   *If NO, is insurance requiring patient see PCP for this issue before PCP can refer them? Referral for which specialty:  Neurology Preferred provider/office: Not in [place] Reason for referral: pt was dismissed from [facility]rology

## 2019-05-01 DIAGNOSIS — M150 Primary generalized (osteo)arthritis: Secondary | ICD-10-CM | POA: Diagnosis not present

## 2019-05-01 DIAGNOSIS — I639 Cerebral infarction, unspecified: Secondary | ICD-10-CM | POA: Diagnosis not present

## 2019-05-01 DIAGNOSIS — E118 Type 2 diabetes mellitus with unspecified complications: Secondary | ICD-10-CM | POA: Diagnosis not present

## 2019-05-01 NOTE — Telephone Encounter (Signed)
It is ok to place referral as requested. Dx CVD and vascular dementia. Thanks, BJ

## 2019-05-01 NOTE — Telephone Encounter (Signed)
Referral has been placed. Patient is aware.  

## 2019-05-02 ENCOUNTER — Other Ambulatory Visit: Payer: Self-pay

## 2019-05-02 ENCOUNTER — Ambulatory Visit (INDEPENDENT_AMBULATORY_CARE_PROVIDER_SITE_OTHER): Payer: Medicare Other | Admitting: Pharmacist

## 2019-05-02 DIAGNOSIS — Z7901 Long term (current) use of anticoagulants: Secondary | ICD-10-CM

## 2019-05-02 DIAGNOSIS — I2699 Other pulmonary embolism without acute cor pulmonale: Secondary | ICD-10-CM

## 2019-05-02 DIAGNOSIS — I6350 Cerebral infarction due to unspecified occlusion or stenosis of unspecified cerebral artery: Secondary | ICD-10-CM | POA: Diagnosis not present

## 2019-05-02 DIAGNOSIS — I639 Cerebral infarction, unspecified: Secondary | ICD-10-CM | POA: Diagnosis not present

## 2019-05-02 DIAGNOSIS — M150 Primary generalized (osteo)arthritis: Secondary | ICD-10-CM | POA: Diagnosis not present

## 2019-05-02 DIAGNOSIS — E118 Type 2 diabetes mellitus with unspecified complications: Secondary | ICD-10-CM | POA: Diagnosis not present

## 2019-05-02 LAB — POCT INR: INR: 1.1 — AB (ref 2.0–3.0)

## 2019-05-02 NOTE — Patient Instructions (Signed)
Description   Take 1.5 tablets today, 1 tablet tomorrow, and 1.5 tablets on Friday, then resume same dosage 1/2 tablet daily except 1 tablet on Mondays, Wednesday Fridays. Recheck INR in 1 week.  Call coumadin clinic with any questions  [phone]

## 2019-05-03 DIAGNOSIS — I639 Cerebral infarction, unspecified: Secondary | ICD-10-CM | POA: Diagnosis not present

## 2019-05-03 DIAGNOSIS — E118 Type 2 diabetes mellitus with unspecified complications: Secondary | ICD-10-CM | POA: Diagnosis not present

## 2019-05-03 DIAGNOSIS — M150 Primary generalized (osteo)arthritis: Secondary | ICD-10-CM | POA: Diagnosis not present

## 2019-05-04 DIAGNOSIS — M150 Primary generalized (osteo)arthritis: Secondary | ICD-10-CM | POA: Diagnosis not present

## 2019-05-04 DIAGNOSIS — I639 Cerebral infarction, unspecified: Secondary | ICD-10-CM | POA: Diagnosis not present

## 2019-05-04 DIAGNOSIS — E118 Type 2 diabetes mellitus with unspecified complications: Secondary | ICD-10-CM | POA: Diagnosis not present

## 2019-05-05 DIAGNOSIS — I639 Cerebral infarction, unspecified: Secondary | ICD-10-CM | POA: Diagnosis not present

## 2019-05-05 DIAGNOSIS — E118 Type 2 diabetes mellitus with unspecified complications: Secondary | ICD-10-CM | POA: Diagnosis not present

## 2019-05-05 DIAGNOSIS — M150 Primary generalized (osteo)arthritis: Secondary | ICD-10-CM | POA: Diagnosis not present

## 2019-05-06 DIAGNOSIS — I639 Cerebral infarction, unspecified: Secondary | ICD-10-CM | POA: Diagnosis not present

## 2019-05-06 DIAGNOSIS — M150 Primary generalized (osteo)arthritis: Secondary | ICD-10-CM | POA: Diagnosis not present

## 2019-05-06 DIAGNOSIS — E118 Type 2 diabetes mellitus with unspecified complications: Secondary | ICD-10-CM | POA: Diagnosis not present

## 2019-05-07 DIAGNOSIS — E118 Type 2 diabetes mellitus with unspecified complications: Secondary | ICD-10-CM | POA: Diagnosis not present

## 2019-05-07 DIAGNOSIS — I639 Cerebral infarction, unspecified: Secondary | ICD-10-CM | POA: Diagnosis not present

## 2019-05-07 DIAGNOSIS — M150 Primary generalized (osteo)arthritis: Secondary | ICD-10-CM | POA: Diagnosis not present

## 2019-05-08 ENCOUNTER — Telehealth: Payer: Self-pay | Admitting: Family Medicine

## 2019-05-08 DIAGNOSIS — M150 Primary generalized (osteo)arthritis: Secondary | ICD-10-CM | POA: Diagnosis not present

## 2019-05-08 DIAGNOSIS — I639 Cerebral infarction, unspecified: Secondary | ICD-10-CM | POA: Diagnosis not present

## 2019-05-08 DIAGNOSIS — E118 Type 2 diabetes mellitus with unspecified complications: Secondary | ICD-10-CM | POA: Diagnosis not present

## 2019-05-08 NOTE — Telephone Encounter (Signed)
Pt's daughter would like a referral to [facility] Neuro (Dr Leonie Man).

## 2019-05-08 NOTE — Telephone Encounter (Signed)
Ok for referral?

## 2019-05-09 ENCOUNTER — Other Ambulatory Visit: Payer: Self-pay | Admitting: *Deleted

## 2019-05-09 DIAGNOSIS — F0151 Vascular dementia with behavioral disturbance: Secondary | ICD-10-CM

## 2019-05-09 DIAGNOSIS — M150 Primary generalized (osteo)arthritis: Secondary | ICD-10-CM | POA: Diagnosis not present

## 2019-05-09 DIAGNOSIS — E118 Type 2 diabetes mellitus with unspecified complications: Secondary | ICD-10-CM | POA: Diagnosis not present

## 2019-05-09 DIAGNOSIS — F01518 Vascular dementia, unspecified severity, with other behavioral disturbance: Secondary | ICD-10-CM

## 2019-05-09 DIAGNOSIS — I639 Cerebral infarction, unspecified: Secondary | ICD-10-CM | POA: Diagnosis not present

## 2019-05-09 NOTE — Telephone Encounter (Signed)
Referral placed to [facility] for Dr. Leonie Man as requested.

## 2019-05-09 NOTE — Telephone Encounter (Signed)
I thought she was already referred to neurologist. It is ok to place referral as requested. Thanks, BJ

## 2019-05-10 DIAGNOSIS — E118 Type 2 diabetes mellitus with unspecified complications: Secondary | ICD-10-CM | POA: Diagnosis not present

## 2019-05-10 DIAGNOSIS — I639 Cerebral infarction, unspecified: Secondary | ICD-10-CM | POA: Diagnosis not present

## 2019-05-10 DIAGNOSIS — M150 Primary generalized (osteo)arthritis: Secondary | ICD-10-CM | POA: Diagnosis not present

## 2019-05-11 DIAGNOSIS — M150 Primary generalized (osteo)arthritis: Secondary | ICD-10-CM | POA: Diagnosis not present

## 2019-05-11 DIAGNOSIS — I639 Cerebral infarction, unspecified: Secondary | ICD-10-CM | POA: Diagnosis not present

## 2019-05-11 DIAGNOSIS — E118 Type 2 diabetes mellitus with unspecified complications: Secondary | ICD-10-CM | POA: Diagnosis not present

## 2019-05-12 DIAGNOSIS — M150 Primary generalized (osteo)arthritis: Secondary | ICD-10-CM | POA: Diagnosis not present

## 2019-05-12 DIAGNOSIS — E118 Type 2 diabetes mellitus with unspecified complications: Secondary | ICD-10-CM | POA: Diagnosis not present

## 2019-05-12 DIAGNOSIS — I639 Cerebral infarction, unspecified: Secondary | ICD-10-CM | POA: Diagnosis not present

## 2019-05-13 DIAGNOSIS — I639 Cerebral infarction, unspecified: Secondary | ICD-10-CM | POA: Diagnosis not present

## 2019-05-13 DIAGNOSIS — M150 Primary generalized (osteo)arthritis: Secondary | ICD-10-CM | POA: Diagnosis not present

## 2019-05-13 DIAGNOSIS — E118 Type 2 diabetes mellitus with unspecified complications: Secondary | ICD-10-CM | POA: Diagnosis not present

## 2019-05-14 DIAGNOSIS — I639 Cerebral infarction, unspecified: Secondary | ICD-10-CM | POA: Diagnosis not present

## 2019-05-14 DIAGNOSIS — E118 Type 2 diabetes mellitus with unspecified complications: Secondary | ICD-10-CM | POA: Diagnosis not present

## 2019-05-14 DIAGNOSIS — M150 Primary generalized (osteo)arthritis: Secondary | ICD-10-CM | POA: Diagnosis not present

## 2019-05-15 ENCOUNTER — Other Ambulatory Visit: Payer: Self-pay

## 2019-05-15 ENCOUNTER — Ambulatory Visit (INDEPENDENT_AMBULATORY_CARE_PROVIDER_SITE_OTHER): Payer: Medicare Other | Admitting: Pharmacist

## 2019-05-15 DIAGNOSIS — I2699 Other pulmonary embolism without acute cor pulmonale: Secondary | ICD-10-CM | POA: Diagnosis not present

## 2019-05-15 DIAGNOSIS — E118 Type 2 diabetes mellitus with unspecified complications: Secondary | ICD-10-CM | POA: Diagnosis not present

## 2019-05-15 DIAGNOSIS — M150 Primary generalized (osteo)arthritis: Secondary | ICD-10-CM | POA: Diagnosis not present

## 2019-05-15 DIAGNOSIS — Z7901 Long term (current) use of anticoagulants: Secondary | ICD-10-CM

## 2019-05-15 DIAGNOSIS — I639 Cerebral infarction, unspecified: Secondary | ICD-10-CM | POA: Diagnosis not present

## 2019-05-15 DIAGNOSIS — I6350 Cerebral infarction due to unspecified occlusion or stenosis of unspecified cerebral artery: Secondary | ICD-10-CM

## 2019-05-15 LAB — POCT INR: INR: 2.2 (ref 2.0–3.0)

## 2019-05-15 MED ORDER — LEVOTHYROXINE SODIUM 175 MCG PO TABS: ORAL_TABLET | ORAL | 0 refills | Status: DC

## 2019-05-15 NOTE — Patient Instructions (Signed)
Description   Continue taking 1/2 tablet daily except 1 tablet on Mondays, Wednesday Fridays. Recheck INR in 3 week.  Call coumadin clinic with any questions  [phone]

## 2019-05-16 DIAGNOSIS — M150 Primary generalized (osteo)arthritis: Secondary | ICD-10-CM | POA: Diagnosis not present

## 2019-05-16 DIAGNOSIS — I639 Cerebral infarction, unspecified: Secondary | ICD-10-CM | POA: Diagnosis not present

## 2019-05-16 DIAGNOSIS — E118 Type 2 diabetes mellitus with unspecified complications: Secondary | ICD-10-CM | POA: Diagnosis not present

## 2019-05-17 DIAGNOSIS — I639 Cerebral infarction, unspecified: Secondary | ICD-10-CM | POA: Diagnosis not present

## 2019-05-17 DIAGNOSIS — M150 Primary generalized (osteo)arthritis: Secondary | ICD-10-CM | POA: Diagnosis not present

## 2019-05-17 DIAGNOSIS — E118 Type 2 diabetes mellitus with unspecified complications: Secondary | ICD-10-CM | POA: Diagnosis not present

## 2019-05-18 ENCOUNTER — Other Ambulatory Visit: Payer: Self-pay | Admitting: *Deleted

## 2019-05-18 ENCOUNTER — Ambulatory Visit: Payer: Medicare Other

## 2019-05-18 ENCOUNTER — Encounter: Payer: Self-pay | Admitting: *Deleted

## 2019-05-18 DIAGNOSIS — E118 Type 2 diabetes mellitus with unspecified complications: Secondary | ICD-10-CM | POA: Diagnosis not present

## 2019-05-18 DIAGNOSIS — I639 Cerebral infarction, unspecified: Secondary | ICD-10-CM | POA: Diagnosis not present

## 2019-05-18 DIAGNOSIS — M150 Primary generalized (osteo)arthritis: Secondary | ICD-10-CM | POA: Diagnosis not present

## 2019-05-18 NOTE — Patient Outreach (Signed)
[place] [facility]) Care Management  [place]  05/18/2019   Connie Ruiz 1961/02/14 [id number]   RN Health Coach Monthly Outreach  Referral Date:  11/28/2018 Referral Source:  [facility] High Risk Screening Reason for Referral:  Disease Management Education Insurance:  [employer]   Outreach Attempt:  Successful telephone outreach to patient for follow up.  HIPAA verified with patient.  Patient reporting she continues to have lupus pain with the change of cold weather.  Denies any recent sick days or any recent falls.  Does report occasional light headiness and dizziness.  Patient is reporting she has been checking her blood pressure and blood sugars; but unable to give me recent readings.  Just states "they have been fine".  Denies any hypo or hyperglycemia and also denies any hypo or hypertension.  Encouraged patient to continue to monitor blood pressures and blood sugars and to notify provider for sustained elevations or lows.  Encounter Medications:  Outpatient Encounter Medications as of 05/18/2019  Medication Sig  . acetaminophen (TYLENOL) 500 MG tablet Take 1,000 mg by mouth daily as needed for mild pain.  Marland Kitchen alendronate (FOSAMAX) 70 MG tablet TAKE 1 TABLET BY MOUTH ONCE A WEEK ON AN EMPTY STOMACH ON SATURDAYS WITH A FULL GLASS OF WATER. (Patient taking differently: Take 70 mg by mouth once a week. )  . atorvastatin (LIPITOR) 40 MG tablet Take 40 mg by mouth daily at 6 PM.   . calcitRIOL (ROCALTROL) 0.25 MCG capsule TAKE 1 CAPSULE(0.25 MCG) BY MOUTH DAILY. NO REFILLS WITHOUT APPOINTMENT  . diltiazem (TIAZAC) 360 MG 24 hr capsule Take 1 capsule (360 mg total) by mouth daily.  Marland Kitchen donepezil (ARICEPT) 10 MG tablet Take 10 mg by mouth at bedtime.   Marland Kitchen esomeprazole (NEXIUM) 20 MG capsule TAKE 1 CAPSULE(20 MG) BY MOUTH DAILY (Patient taking differently: Take 20 mg by mouth daily at 12 noon. )  . feeding supplement, ENSURE ENLIVE, (ENSURE ENLIVE) LIQD Take 237 mLs  by mouth 2 (two) times daily between meals.  Marland Kitchen levothyroxine (SYNTHROID) 175 MCG tablet TAKE 1 TABLET(175 MCG) BY MOUTH DAILY BEFORE BREAKFAST  . losartan (COZAAR) 100 MG tablet Take 1 tablet (100 mg total) by mouth daily.  . magnesium oxide (MAG-OX) 400 MG tablet Take 800 mg by mouth daily.   . metFORMIN (GLUCOPHAGE-XR) 500 MG 24 hr tablet TAKE 1 TABLET(500 MG) BY MOUTH DAILY WITH BREAKFAST  . mirtazapine (REMERON) 30 MG tablet Take 1 tablet (30 mg total) by mouth at bedtime.  . mycophenolate (CELLCEPT) 250 MG capsule TAKE ONE CAPSULE BY MOUTH TWICE DAILY  . ondansetron (ZOFRAN ODT) 4 MG disintegrating tablet 77m ODT q4 hours prn nausea/vomiting  . oxybutynin (DITROPAN) 5 MG tablet Take 5 mg by mouth daily as needed for bladder spasms.   . Potassium Chloride ER 20 MEQ TBCR Take 1 tablet by mouth daily. NEEDS POTASSIUM RE-CHECK.  . predniSONE (DELTASONE) 5 MG tablet Take 5 mg by mouth daily with breakfast.   . QUEtiapine (SEROQUEL) 25 MG tablet Take 1 tablet (25 mg total) by mouth at bedtime.  . sertraline (ZOLOFT) 100 MG tablet Take 0.5 tablets (50 mg total) by mouth daily.  . tacrolimus (PROGRAF) 1 MG capsule Take 1-2 mg by mouth See admin instructions. 2 mg in the am and 1 mg at night  . warfarin (COUMADIN) 5 MG tablet TAKE 1/2 TO 1 TABLET BY MOUTH ON MONDAY THROUGH FRIDAY; TUESDAY, WEDNESDAY, THURSDAY, SATAND SUNDAY-2.5MG  . Blood Glucose Monitoring Suppl (ACCU-CHEK  AVIVA PLUS) w/Device KIT 1 Device by Does not apply route daily.  Marland Kitchen glucose blood (ONE TOUCH ULTRA TEST) test strip Use to check blood sugar 1 time per day  . nystatin (MYCOSTATIN) powder Apply 1 g topically 4 (four) times daily as needed. For yeast under breast  . ONETOUCH DELICA LANCETS 34J MISC Use to check blood sugar 1 time per day  . sucralfate (CARAFATE) 1 g tablet Take 1 tablet (1 g total) by mouth 4 (four) times daily for 30 days.   No facility-administered encounter medications on file as of 05/18/2019.    Functional  Status:  In your present state of health, do you have any difficulty performing the following activities: 01/16/2019 08/14/2018  Hearing? N N  Vision? N N  Difficulty concentrating or making decisions? Y Y  Comment patient has had stokes -  Walking or climbing stairs? N Y  Dressing or bathing? Tempie Donning  Comment needs assistance with washing her back -  Doing errands, shopping? Y Y  Comment She does not do errands her daughter will help her -  Conservation officer, nature and eating ? N -  Using the Toilet? N -  In the past six months, have you accidently leaked urine? N -  Do you have problems with loss of bowel control? N -  Managing your Medications? Y -  Comment Daughter helps -  Managing your Finances? Y -  Comment Daughteer helps -  Housekeeping or managing your Housekeeping? Y -  Comment Daughter helps -  Some recent data might be hidden    Fall/Depression Screening: Fall Risk  05/18/2019 02/16/2019 01/16/2019  Falls in the past year? 0 0 0  Number falls in past yr: - - -  Injury with Fall? - - -  Risk Factor Category  - - -  Risk for fall due to : Medication side effect - -  Follow up Education provided;Falls evaluation completed;Falls prevention discussed - -   PHQ 2/9 Scores 01/16/2019 11/28/2018 08/27/2015  PHQ - 2 Score _0 PHQ- 9 Score - - 7   [facility] CM Care Plan Problem One     Most Recent Value  Care Plan Problem One  Knowledge deficiet related to self care management of hypertension and medical follow up  Role Documenting the Problem One  [place] for Problem One  Active  [facility] Long Term Goal   Patient will report scheduling follow up with primary care provider in the next 90 days.  [facility] Long Term Goal Start Date  05/18/19  Interventions for Problem One Long Term Goal  Care plan and goals reviewed and discussed with patient, reviewed last appointment with primary care provider with patient and discussed followup recommeded, encouraged patient to contact primary care and  schedule appointment as soon as possible, encouraged patient to monitor blood pressures and blood sugars and to notify primary care for sustained elevations, reviewed medications and encouraged medication compliance,      Appointments:  Patient last attended appointment with primary care provider in May 2020 and 3-4 month follow up was recommended.  Encouraged patient to contact primary care provider for appointment as soon as she can.  Plan: RN Health Coach will send primary care provider quarterly update. RN Health Coach will make next telephone outreach to patient within the month of March and patient agreeable to future outreach.  [place] [phone] [email or web address]

## 2019-05-19 DIAGNOSIS — I639 Cerebral infarction, unspecified: Secondary | ICD-10-CM | POA: Diagnosis not present

## 2019-05-19 DIAGNOSIS — E118 Type 2 diabetes mellitus with unspecified complications: Secondary | ICD-10-CM | POA: Diagnosis not present

## 2019-05-19 DIAGNOSIS — M150 Primary generalized (osteo)arthritis: Secondary | ICD-10-CM | POA: Diagnosis not present

## 2019-05-20 DIAGNOSIS — M150 Primary generalized (osteo)arthritis: Secondary | ICD-10-CM | POA: Diagnosis not present

## 2019-05-20 DIAGNOSIS — E118 Type 2 diabetes mellitus with unspecified complications: Secondary | ICD-10-CM | POA: Diagnosis not present

## 2019-05-20 DIAGNOSIS — I639 Cerebral infarction, unspecified: Secondary | ICD-10-CM | POA: Diagnosis not present

## 2019-05-21 DIAGNOSIS — I639 Cerebral infarction, unspecified: Secondary | ICD-10-CM | POA: Diagnosis not present

## 2019-05-21 DIAGNOSIS — E118 Type 2 diabetes mellitus with unspecified complications: Secondary | ICD-10-CM | POA: Diagnosis not present

## 2019-05-21 DIAGNOSIS — M150 Primary generalized (osteo)arthritis: Secondary | ICD-10-CM | POA: Diagnosis not present

## 2019-05-22 DIAGNOSIS — M150 Primary generalized (osteo)arthritis: Secondary | ICD-10-CM | POA: Diagnosis not present

## 2019-05-22 DIAGNOSIS — I639 Cerebral infarction, unspecified: Secondary | ICD-10-CM | POA: Diagnosis not present

## 2019-05-22 DIAGNOSIS — E118 Type 2 diabetes mellitus with unspecified complications: Secondary | ICD-10-CM | POA: Diagnosis not present

## 2019-05-23 DIAGNOSIS — M150 Primary generalized (osteo)arthritis: Secondary | ICD-10-CM | POA: Diagnosis not present

## 2019-05-23 DIAGNOSIS — I639 Cerebral infarction, unspecified: Secondary | ICD-10-CM | POA: Diagnosis not present

## 2019-05-23 DIAGNOSIS — E118 Type 2 diabetes mellitus with unspecified complications: Secondary | ICD-10-CM | POA: Diagnosis not present

## 2019-05-24 DIAGNOSIS — E118 Type 2 diabetes mellitus with unspecified complications: Secondary | ICD-10-CM | POA: Diagnosis not present

## 2019-05-24 DIAGNOSIS — M150 Primary generalized (osteo)arthritis: Secondary | ICD-10-CM | POA: Diagnosis not present

## 2019-05-24 DIAGNOSIS — I639 Cerebral infarction, unspecified: Secondary | ICD-10-CM | POA: Diagnosis not present

## 2019-05-25 DIAGNOSIS — E118 Type 2 diabetes mellitus with unspecified complications: Secondary | ICD-10-CM | POA: Diagnosis not present

## 2019-05-25 DIAGNOSIS — I639 Cerebral infarction, unspecified: Secondary | ICD-10-CM | POA: Diagnosis not present

## 2019-05-25 DIAGNOSIS — M150 Primary generalized (osteo)arthritis: Secondary | ICD-10-CM | POA: Diagnosis not present

## 2019-05-26 DIAGNOSIS — M150 Primary generalized (osteo)arthritis: Secondary | ICD-10-CM | POA: Diagnosis not present

## 2019-05-26 DIAGNOSIS — E118 Type 2 diabetes mellitus with unspecified complications: Secondary | ICD-10-CM | POA: Diagnosis not present

## 2019-05-26 DIAGNOSIS — I639 Cerebral infarction, unspecified: Secondary | ICD-10-CM | POA: Diagnosis not present

## 2019-05-27 DIAGNOSIS — M150 Primary generalized (osteo)arthritis: Secondary | ICD-10-CM | POA: Diagnosis not present

## 2019-05-27 DIAGNOSIS — I639 Cerebral infarction, unspecified: Secondary | ICD-10-CM | POA: Diagnosis not present

## 2019-05-27 DIAGNOSIS — E118 Type 2 diabetes mellitus with unspecified complications: Secondary | ICD-10-CM | POA: Diagnosis not present

## 2019-05-28 DIAGNOSIS — E118 Type 2 diabetes mellitus with unspecified complications: Secondary | ICD-10-CM | POA: Diagnosis not present

## 2019-05-28 DIAGNOSIS — I639 Cerebral infarction, unspecified: Secondary | ICD-10-CM | POA: Diagnosis not present

## 2019-05-28 DIAGNOSIS — M150 Primary generalized (osteo)arthritis: Secondary | ICD-10-CM | POA: Diagnosis not present

## 2019-05-29 DIAGNOSIS — I639 Cerebral infarction, unspecified: Secondary | ICD-10-CM | POA: Diagnosis not present

## 2019-05-29 DIAGNOSIS — M150 Primary generalized (osteo)arthritis: Secondary | ICD-10-CM | POA: Diagnosis not present

## 2019-05-29 DIAGNOSIS — E118 Type 2 diabetes mellitus with unspecified complications: Secondary | ICD-10-CM | POA: Diagnosis not present

## 2019-05-30 DIAGNOSIS — M150 Primary generalized (osteo)arthritis: Secondary | ICD-10-CM | POA: Diagnosis not present

## 2019-05-30 DIAGNOSIS — E118 Type 2 diabetes mellitus with unspecified complications: Secondary | ICD-10-CM | POA: Diagnosis not present

## 2019-05-30 DIAGNOSIS — I639 Cerebral infarction, unspecified: Secondary | ICD-10-CM | POA: Diagnosis not present

## 2019-05-31 DIAGNOSIS — I639 Cerebral infarction, unspecified: Secondary | ICD-10-CM | POA: Diagnosis not present

## 2019-05-31 DIAGNOSIS — E118 Type 2 diabetes mellitus with unspecified complications: Secondary | ICD-10-CM | POA: Diagnosis not present

## 2019-05-31 DIAGNOSIS — M150 Primary generalized (osteo)arthritis: Secondary | ICD-10-CM | POA: Diagnosis not present

## 2019-06-01 DIAGNOSIS — E118 Type 2 diabetes mellitus with unspecified complications: Secondary | ICD-10-CM | POA: Diagnosis not present

## 2019-06-01 DIAGNOSIS — M150 Primary generalized (osteo)arthritis: Secondary | ICD-10-CM | POA: Diagnosis not present

## 2019-06-01 DIAGNOSIS — I639 Cerebral infarction, unspecified: Secondary | ICD-10-CM | POA: Diagnosis not present

## 2019-06-02 DIAGNOSIS — I639 Cerebral infarction, unspecified: Secondary | ICD-10-CM | POA: Diagnosis not present

## 2019-06-02 DIAGNOSIS — E118 Type 2 diabetes mellitus with unspecified complications: Secondary | ICD-10-CM | POA: Diagnosis not present

## 2019-06-02 DIAGNOSIS — M150 Primary generalized (osteo)arthritis: Secondary | ICD-10-CM | POA: Diagnosis not present

## 2019-06-03 DIAGNOSIS — I639 Cerebral infarction, unspecified: Secondary | ICD-10-CM | POA: Diagnosis not present

## 2019-06-03 DIAGNOSIS — E118 Type 2 diabetes mellitus with unspecified complications: Secondary | ICD-10-CM | POA: Diagnosis not present

## 2019-06-03 DIAGNOSIS — M150 Primary generalized (osteo)arthritis: Secondary | ICD-10-CM | POA: Diagnosis not present

## 2019-06-04 DIAGNOSIS — M150 Primary generalized (osteo)arthritis: Secondary | ICD-10-CM | POA: Diagnosis not present

## 2019-06-04 DIAGNOSIS — E118 Type 2 diabetes mellitus with unspecified complications: Secondary | ICD-10-CM | POA: Diagnosis not present

## 2019-06-04 DIAGNOSIS — I639 Cerebral infarction, unspecified: Secondary | ICD-10-CM | POA: Diagnosis not present

## 2019-06-05 ENCOUNTER — Other Ambulatory Visit: Payer: Self-pay | Admitting: Family Medicine

## 2019-06-05 DIAGNOSIS — I639 Cerebral infarction, unspecified: Secondary | ICD-10-CM | POA: Diagnosis not present

## 2019-06-05 DIAGNOSIS — E118 Type 2 diabetes mellitus with unspecified complications: Secondary | ICD-10-CM | POA: Diagnosis not present

## 2019-06-05 DIAGNOSIS — M150 Primary generalized (osteo)arthritis: Secondary | ICD-10-CM | POA: Diagnosis not present

## 2019-06-05 MED ORDER — METFORMIN HCL ER 500 MG PO TB24: ORAL_TABLET | ORAL | 0 refills | Status: DC

## 2019-06-05 NOTE — Telephone Encounter (Signed)
Medication Refill - Medication: metFORMIN (GLUCOPHAGE-XR) 500 MG 24 hr tablet    Preferred Pharmacy (with phone number or street name):  [id number] - [place], [address] Phone:  [phone]  Fax:  [phone]       Agent: Please be advised that RX refills may take up to 3 business days. We ask that you follow-up with your pharmacy.

## 2019-06-05 NOTE — Telephone Encounter (Signed)
Requested medication (s) are due for refill today: yes  Requested medication (s) are on the active medication list: yes  Last refill:  08/20/2018  Future visit scheduled: no  Notes to clinic:  no valid encounter within last 6 months  Requested Prescriptions  Pending Prescriptions Disp Refills   metFORMIN (GLUCOPHAGE-XR) 500 MG 24 hr tablet 90 tablet 0    Sig: TAKE 1 TABLET(500 MG) BY MOUTH DAILY WITH BREAKFAST      Endocrinology:  Diabetes - Biguanides Failed - 06/05/2019  3:13 PM      Failed - HBA1C is between 0 and 7.9 and within 180 days    Hgb A1c MFr Bld  Date Value Ref Range Status  08/15/2018 6.7 (H) 4.8 - 5.6 % Final    Comment:    (NOTE) Pre diabetes:          5.7%-6.4% Diabetes:              >6.4% Glycemic control for   <7.0% adults with diabetes           Failed - Valid encounter within last 6 months    Recent Outpatient Visits           7 months ago Essential hypertension   [name], Malka So, MD   7 years ago Type II or unspecified type diabetes mellitus with renal manifestations, uncontrolled   [facility] -Areatha Keas, MD   7 years ago Cough   [facility] -Areatha Keas, MD   7 years ago [facility] -Gaylyn Lambert, Hilliard Clark, MD   8 years ago Arm pain, left   [place], Sean, MD              Passed - Cr in normal range and within 360 days    Creatinine, Ser  Date Value Ref Range Status  10/18/2018 0.57 0.40 - 1.20 mg/dL Final   Creatinine,U  Date Value Ref Range Status  10/22/2013 292.7 mg/dL Final          Passed - eGFR in normal range and within 360 days    GFR calc Af Amer  Date Value Ref Range Status  08/21/2018 >60 >60 mL/min Final   GFR calc non Af Amer  Date Value Ref Range Status  08/21/2018 >60 >60 mL/min Final   GFR  Date Value Ref Range Status  10/18/2018 131.80 >60.00 mL/min  Final

## 2019-06-06 DIAGNOSIS — M150 Primary generalized (osteo)arthritis: Secondary | ICD-10-CM | POA: Diagnosis not present

## 2019-06-06 DIAGNOSIS — I639 Cerebral infarction, unspecified: Secondary | ICD-10-CM | POA: Diagnosis not present

## 2019-06-06 DIAGNOSIS — E118 Type 2 diabetes mellitus with unspecified complications: Secondary | ICD-10-CM | POA: Diagnosis not present

## 2019-06-07 DIAGNOSIS — I639 Cerebral infarction, unspecified: Secondary | ICD-10-CM | POA: Diagnosis not present

## 2019-06-07 DIAGNOSIS — M150 Primary generalized (osteo)arthritis: Secondary | ICD-10-CM | POA: Diagnosis not present

## 2019-06-07 DIAGNOSIS — E118 Type 2 diabetes mellitus with unspecified complications: Secondary | ICD-10-CM | POA: Diagnosis not present

## 2019-06-08 DIAGNOSIS — M150 Primary generalized (osteo)arthritis: Secondary | ICD-10-CM | POA: Diagnosis not present

## 2019-06-08 DIAGNOSIS — E118 Type 2 diabetes mellitus with unspecified complications: Secondary | ICD-10-CM | POA: Diagnosis not present

## 2019-06-08 DIAGNOSIS — I639 Cerebral infarction, unspecified: Secondary | ICD-10-CM | POA: Diagnosis not present

## 2019-06-09 DIAGNOSIS — E118 Type 2 diabetes mellitus with unspecified complications: Secondary | ICD-10-CM | POA: Diagnosis not present

## 2019-06-09 DIAGNOSIS — I639 Cerebral infarction, unspecified: Secondary | ICD-10-CM | POA: Diagnosis not present

## 2019-06-09 DIAGNOSIS — M150 Primary generalized (osteo)arthritis: Secondary | ICD-10-CM | POA: Diagnosis not present

## 2019-06-10 DIAGNOSIS — M150 Primary generalized (osteo)arthritis: Secondary | ICD-10-CM | POA: Diagnosis not present

## 2019-06-10 DIAGNOSIS — E118 Type 2 diabetes mellitus with unspecified complications: Secondary | ICD-10-CM | POA: Diagnosis not present

## 2019-06-10 DIAGNOSIS — I639 Cerebral infarction, unspecified: Secondary | ICD-10-CM | POA: Diagnosis not present

## 2019-06-11 DIAGNOSIS — I639 Cerebral infarction, unspecified: Secondary | ICD-10-CM | POA: Diagnosis not present

## 2019-06-11 DIAGNOSIS — E118 Type 2 diabetes mellitus with unspecified complications: Secondary | ICD-10-CM | POA: Diagnosis not present

## 2019-06-11 DIAGNOSIS — M150 Primary generalized (osteo)arthritis: Secondary | ICD-10-CM | POA: Diagnosis not present

## 2019-06-12 DIAGNOSIS — M150 Primary generalized (osteo)arthritis: Secondary | ICD-10-CM | POA: Diagnosis not present

## 2019-06-12 DIAGNOSIS — E118 Type 2 diabetes mellitus with unspecified complications: Secondary | ICD-10-CM | POA: Diagnosis not present

## 2019-06-12 DIAGNOSIS — I639 Cerebral infarction, unspecified: Secondary | ICD-10-CM | POA: Diagnosis not present

## 2019-06-13 DIAGNOSIS — M150 Primary generalized (osteo)arthritis: Secondary | ICD-10-CM | POA: Diagnosis not present

## 2019-06-13 DIAGNOSIS — E118 Type 2 diabetes mellitus with unspecified complications: Secondary | ICD-10-CM | POA: Diagnosis not present

## 2019-06-13 DIAGNOSIS — I639 Cerebral infarction, unspecified: Secondary | ICD-10-CM | POA: Diagnosis not present

## 2019-06-14 DIAGNOSIS — M150 Primary generalized (osteo)arthritis: Secondary | ICD-10-CM | POA: Diagnosis not present

## 2019-06-14 DIAGNOSIS — E118 Type 2 diabetes mellitus with unspecified complications: Secondary | ICD-10-CM | POA: Diagnosis not present

## 2019-06-14 DIAGNOSIS — I639 Cerebral infarction, unspecified: Secondary | ICD-10-CM | POA: Diagnosis not present

## 2019-06-15 DIAGNOSIS — E118 Type 2 diabetes mellitus with unspecified complications: Secondary | ICD-10-CM | POA: Diagnosis not present

## 2019-06-15 DIAGNOSIS — I639 Cerebral infarction, unspecified: Secondary | ICD-10-CM | POA: Diagnosis not present

## 2019-06-15 DIAGNOSIS — M150 Primary generalized (osteo)arthritis: Secondary | ICD-10-CM | POA: Diagnosis not present

## 2019-06-16 DIAGNOSIS — M150 Primary generalized (osteo)arthritis: Secondary | ICD-10-CM | POA: Diagnosis not present

## 2019-06-16 DIAGNOSIS — I639 Cerebral infarction, unspecified: Secondary | ICD-10-CM | POA: Diagnosis not present

## 2019-06-16 DIAGNOSIS — E118 Type 2 diabetes mellitus with unspecified complications: Secondary | ICD-10-CM | POA: Diagnosis not present

## 2019-06-17 DIAGNOSIS — E118 Type 2 diabetes mellitus with unspecified complications: Secondary | ICD-10-CM | POA: Diagnosis not present

## 2019-06-17 DIAGNOSIS — M150 Primary generalized (osteo)arthritis: Secondary | ICD-10-CM | POA: Diagnosis not present

## 2019-06-17 DIAGNOSIS — I639 Cerebral infarction, unspecified: Secondary | ICD-10-CM | POA: Diagnosis not present

## 2019-06-18 DIAGNOSIS — E118 Type 2 diabetes mellitus with unspecified complications: Secondary | ICD-10-CM | POA: Diagnosis not present

## 2019-06-18 DIAGNOSIS — I639 Cerebral infarction, unspecified: Secondary | ICD-10-CM | POA: Diagnosis not present

## 2019-06-18 DIAGNOSIS — M150 Primary generalized (osteo)arthritis: Secondary | ICD-10-CM | POA: Diagnosis not present

## 2019-06-19 DIAGNOSIS — E118 Type 2 diabetes mellitus with unspecified complications: Secondary | ICD-10-CM | POA: Diagnosis not present

## 2019-06-19 DIAGNOSIS — M150 Primary generalized (osteo)arthritis: Secondary | ICD-10-CM | POA: Diagnosis not present

## 2019-06-19 DIAGNOSIS — I639 Cerebral infarction, unspecified: Secondary | ICD-10-CM | POA: Diagnosis not present

## 2019-06-20 DIAGNOSIS — E118 Type 2 diabetes mellitus with unspecified complications: Secondary | ICD-10-CM | POA: Diagnosis not present

## 2019-06-20 DIAGNOSIS — I639 Cerebral infarction, unspecified: Secondary | ICD-10-CM | POA: Diagnosis not present

## 2019-06-20 DIAGNOSIS — M150 Primary generalized (osteo)arthritis: Secondary | ICD-10-CM | POA: Diagnosis not present

## 2019-06-21 DIAGNOSIS — E118 Type 2 diabetes mellitus with unspecified complications: Secondary | ICD-10-CM | POA: Diagnosis not present

## 2019-06-21 DIAGNOSIS — I639 Cerebral infarction, unspecified: Secondary | ICD-10-CM | POA: Diagnosis not present

## 2019-06-21 DIAGNOSIS — M150 Primary generalized (osteo)arthritis: Secondary | ICD-10-CM | POA: Diagnosis not present

## 2019-06-22 DIAGNOSIS — I639 Cerebral infarction, unspecified: Secondary | ICD-10-CM | POA: Diagnosis not present

## 2019-06-22 DIAGNOSIS — M150 Primary generalized (osteo)arthritis: Secondary | ICD-10-CM | POA: Diagnosis not present

## 2019-06-22 DIAGNOSIS — E118 Type 2 diabetes mellitus with unspecified complications: Secondary | ICD-10-CM | POA: Diagnosis not present

## 2019-06-23 DIAGNOSIS — M150 Primary generalized (osteo)arthritis: Secondary | ICD-10-CM | POA: Diagnosis not present

## 2019-06-23 DIAGNOSIS — I639 Cerebral infarction, unspecified: Secondary | ICD-10-CM | POA: Diagnosis not present

## 2019-06-23 DIAGNOSIS — E118 Type 2 diabetes mellitus with unspecified complications: Secondary | ICD-10-CM | POA: Diagnosis not present

## 2019-06-24 DIAGNOSIS — E118 Type 2 diabetes mellitus with unspecified complications: Secondary | ICD-10-CM | POA: Diagnosis not present

## 2019-06-24 DIAGNOSIS — I639 Cerebral infarction, unspecified: Secondary | ICD-10-CM | POA: Diagnosis not present

## 2019-06-24 DIAGNOSIS — M150 Primary generalized (osteo)arthritis: Secondary | ICD-10-CM | POA: Diagnosis not present

## 2019-06-25 DIAGNOSIS — E118 Type 2 diabetes mellitus with unspecified complications: Secondary | ICD-10-CM | POA: Diagnosis not present

## 2019-06-25 DIAGNOSIS — I639 Cerebral infarction, unspecified: Secondary | ICD-10-CM | POA: Diagnosis not present

## 2019-06-25 DIAGNOSIS — M150 Primary generalized (osteo)arthritis: Secondary | ICD-10-CM | POA: Diagnosis not present

## 2019-06-26 DIAGNOSIS — I639 Cerebral infarction, unspecified: Secondary | ICD-10-CM | POA: Diagnosis not present

## 2019-06-26 DIAGNOSIS — E118 Type 2 diabetes mellitus with unspecified complications: Secondary | ICD-10-CM | POA: Diagnosis not present

## 2019-06-26 DIAGNOSIS — M150 Primary generalized (osteo)arthritis: Secondary | ICD-10-CM | POA: Diagnosis not present

## 2019-06-27 ENCOUNTER — Other Ambulatory Visit: Payer: Self-pay

## 2019-06-27 DIAGNOSIS — E118 Type 2 diabetes mellitus with unspecified complications: Secondary | ICD-10-CM | POA: Diagnosis not present

## 2019-06-27 DIAGNOSIS — M150 Primary generalized (osteo)arthritis: Secondary | ICD-10-CM | POA: Diagnosis not present

## 2019-06-27 DIAGNOSIS — I639 Cerebral infarction, unspecified: Secondary | ICD-10-CM | POA: Diagnosis not present

## 2019-06-27 MED ORDER — POTASSIUM CHLORIDE ER 20 MEQ PO TBCR: 1.0000 | EXTENDED_RELEASE_TABLET | Freq: Every day | ORAL | 1 refills | Status: DC

## 2019-06-27 MED ORDER — DILTIAZEM HCL ER BEADS 360 MG PO CP24: 360.0000 mg | ORAL_CAPSULE | Freq: Every day | ORAL | 1 refills | Status: DC

## 2019-06-28 DIAGNOSIS — I639 Cerebral infarction, unspecified: Secondary | ICD-10-CM | POA: Diagnosis not present

## 2019-06-28 DIAGNOSIS — M150 Primary generalized (osteo)arthritis: Secondary | ICD-10-CM | POA: Diagnosis not present

## 2019-06-28 DIAGNOSIS — E118 Type 2 diabetes mellitus with unspecified complications: Secondary | ICD-10-CM | POA: Diagnosis not present

## 2019-06-29 ENCOUNTER — Telehealth: Payer: Self-pay | Admitting: Neurology

## 2019-06-29 DIAGNOSIS — M150 Primary generalized (osteo)arthritis: Secondary | ICD-10-CM | POA: Diagnosis not present

## 2019-06-29 DIAGNOSIS — E118 Type 2 diabetes mellitus with unspecified complications: Secondary | ICD-10-CM | POA: Diagnosis not present

## 2019-06-29 DIAGNOSIS — I639 Cerebral infarction, unspecified: Secondary | ICD-10-CM | POA: Diagnosis not present

## 2019-06-29 NOTE — Telephone Encounter (Signed)
I called patient and spoke with daughter regarding rescheduling 1/25 appointment due to Dr. Leonie Man being out of office. Patient's daughter stated she will need to check her schedule and she will call us back.

## 2019-06-30 DIAGNOSIS — M150 Primary generalized (osteo)arthritis: Secondary | ICD-10-CM | POA: Diagnosis not present

## 2019-06-30 DIAGNOSIS — I639 Cerebral infarction, unspecified: Secondary | ICD-10-CM | POA: Diagnosis not present

## 2019-06-30 DIAGNOSIS — E118 Type 2 diabetes mellitus with unspecified complications: Secondary | ICD-10-CM | POA: Diagnosis not present

## 2019-07-01 DIAGNOSIS — I639 Cerebral infarction, unspecified: Secondary | ICD-10-CM | POA: Diagnosis not present

## 2019-07-01 DIAGNOSIS — E118 Type 2 diabetes mellitus with unspecified complications: Secondary | ICD-10-CM | POA: Diagnosis not present

## 2019-07-01 DIAGNOSIS — M150 Primary generalized (osteo)arthritis: Secondary | ICD-10-CM | POA: Diagnosis not present

## 2019-07-02 ENCOUNTER — Ambulatory Visit: Payer: Medicare Other | Admitting: Neurology

## 2019-07-02 DIAGNOSIS — E118 Type 2 diabetes mellitus with unspecified complications: Secondary | ICD-10-CM | POA: Diagnosis not present

## 2019-07-02 DIAGNOSIS — I639 Cerebral infarction, unspecified: Secondary | ICD-10-CM | POA: Diagnosis not present

## 2019-07-02 DIAGNOSIS — M150 Primary generalized (osteo)arthritis: Secondary | ICD-10-CM | POA: Diagnosis not present

## 2019-07-03 DIAGNOSIS — M150 Primary generalized (osteo)arthritis: Secondary | ICD-10-CM | POA: Diagnosis not present

## 2019-07-03 DIAGNOSIS — E118 Type 2 diabetes mellitus with unspecified complications: Secondary | ICD-10-CM | POA: Diagnosis not present

## 2019-07-03 DIAGNOSIS — I639 Cerebral infarction, unspecified: Secondary | ICD-10-CM | POA: Diagnosis not present

## 2019-07-04 DIAGNOSIS — M150 Primary generalized (osteo)arthritis: Secondary | ICD-10-CM | POA: Diagnosis not present

## 2019-07-04 DIAGNOSIS — I639 Cerebral infarction, unspecified: Secondary | ICD-10-CM | POA: Diagnosis not present

## 2019-07-04 DIAGNOSIS — E118 Type 2 diabetes mellitus with unspecified complications: Secondary | ICD-10-CM | POA: Diagnosis not present

## 2019-07-05 DIAGNOSIS — E118 Type 2 diabetes mellitus with unspecified complications: Secondary | ICD-10-CM | POA: Diagnosis not present

## 2019-07-05 DIAGNOSIS — M150 Primary generalized (osteo)arthritis: Secondary | ICD-10-CM | POA: Diagnosis not present

## 2019-07-05 DIAGNOSIS — I639 Cerebral infarction, unspecified: Secondary | ICD-10-CM | POA: Diagnosis not present

## 2019-07-06 DIAGNOSIS — E118 Type 2 diabetes mellitus with unspecified complications: Secondary | ICD-10-CM | POA: Diagnosis not present

## 2019-07-06 DIAGNOSIS — I639 Cerebral infarction, unspecified: Secondary | ICD-10-CM | POA: Diagnosis not present

## 2019-07-06 DIAGNOSIS — M150 Primary generalized (osteo)arthritis: Secondary | ICD-10-CM | POA: Diagnosis not present

## 2019-07-07 DIAGNOSIS — M150 Primary generalized (osteo)arthritis: Secondary | ICD-10-CM | POA: Diagnosis not present

## 2019-07-07 DIAGNOSIS — E118 Type 2 diabetes mellitus with unspecified complications: Secondary | ICD-10-CM | POA: Diagnosis not present

## 2019-07-07 DIAGNOSIS — I639 Cerebral infarction, unspecified: Secondary | ICD-10-CM | POA: Diagnosis not present

## 2019-07-08 DIAGNOSIS — I639 Cerebral infarction, unspecified: Secondary | ICD-10-CM | POA: Diagnosis not present

## 2019-07-08 DIAGNOSIS — M150 Primary generalized (osteo)arthritis: Secondary | ICD-10-CM | POA: Diagnosis not present

## 2019-07-08 DIAGNOSIS — E118 Type 2 diabetes mellitus with unspecified complications: Secondary | ICD-10-CM | POA: Diagnosis not present

## 2019-07-09 DIAGNOSIS — M150 Primary generalized (osteo)arthritis: Secondary | ICD-10-CM | POA: Diagnosis not present

## 2019-07-09 DIAGNOSIS — I639 Cerebral infarction, unspecified: Secondary | ICD-10-CM | POA: Diagnosis not present

## 2019-07-09 DIAGNOSIS — E118 Type 2 diabetes mellitus with unspecified complications: Secondary | ICD-10-CM | POA: Diagnosis not present

## 2019-07-10 DIAGNOSIS — E118 Type 2 diabetes mellitus with unspecified complications: Secondary | ICD-10-CM | POA: Diagnosis not present

## 2019-07-10 DIAGNOSIS — M150 Primary generalized (osteo)arthritis: Secondary | ICD-10-CM | POA: Diagnosis not present

## 2019-07-10 DIAGNOSIS — I639 Cerebral infarction, unspecified: Secondary | ICD-10-CM | POA: Diagnosis not present

## 2019-07-11 DIAGNOSIS — I639 Cerebral infarction, unspecified: Secondary | ICD-10-CM | POA: Diagnosis not present

## 2019-07-11 DIAGNOSIS — E118 Type 2 diabetes mellitus with unspecified complications: Secondary | ICD-10-CM | POA: Diagnosis not present

## 2019-07-11 DIAGNOSIS — M150 Primary generalized (osteo)arthritis: Secondary | ICD-10-CM | POA: Diagnosis not present

## 2019-07-12 DIAGNOSIS — I639 Cerebral infarction, unspecified: Secondary | ICD-10-CM | POA: Diagnosis not present

## 2019-07-12 DIAGNOSIS — M150 Primary generalized (osteo)arthritis: Secondary | ICD-10-CM | POA: Diagnosis not present

## 2019-07-12 DIAGNOSIS — E118 Type 2 diabetes mellitus with unspecified complications: Secondary | ICD-10-CM | POA: Diagnosis not present

## 2019-07-13 DIAGNOSIS — M150 Primary generalized (osteo)arthritis: Secondary | ICD-10-CM | POA: Diagnosis not present

## 2019-07-13 DIAGNOSIS — E118 Type 2 diabetes mellitus with unspecified complications: Secondary | ICD-10-CM | POA: Diagnosis not present

## 2019-07-13 DIAGNOSIS — I639 Cerebral infarction, unspecified: Secondary | ICD-10-CM | POA: Diagnosis not present

## 2019-07-14 DIAGNOSIS — M150 Primary generalized (osteo)arthritis: Secondary | ICD-10-CM | POA: Diagnosis not present

## 2019-07-14 DIAGNOSIS — E118 Type 2 diabetes mellitus with unspecified complications: Secondary | ICD-10-CM | POA: Diagnosis not present

## 2019-07-14 DIAGNOSIS — I639 Cerebral infarction, unspecified: Secondary | ICD-10-CM | POA: Diagnosis not present

## 2019-07-15 DIAGNOSIS — I639 Cerebral infarction, unspecified: Secondary | ICD-10-CM | POA: Diagnosis not present

## 2019-07-15 DIAGNOSIS — M150 Primary generalized (osteo)arthritis: Secondary | ICD-10-CM | POA: Diagnosis not present

## 2019-07-15 DIAGNOSIS — E118 Type 2 diabetes mellitus with unspecified complications: Secondary | ICD-10-CM | POA: Diagnosis not present

## 2019-07-16 DIAGNOSIS — I639 Cerebral infarction, unspecified: Secondary | ICD-10-CM | POA: Diagnosis not present

## 2019-07-16 DIAGNOSIS — E118 Type 2 diabetes mellitus with unspecified complications: Secondary | ICD-10-CM | POA: Diagnosis not present

## 2019-07-16 DIAGNOSIS — M150 Primary generalized (osteo)arthritis: Secondary | ICD-10-CM | POA: Diagnosis not present

## 2019-07-17 DIAGNOSIS — E118 Type 2 diabetes mellitus with unspecified complications: Secondary | ICD-10-CM | POA: Diagnosis not present

## 2019-07-17 DIAGNOSIS — I639 Cerebral infarction, unspecified: Secondary | ICD-10-CM | POA: Diagnosis not present

## 2019-07-17 DIAGNOSIS — M150 Primary generalized (osteo)arthritis: Secondary | ICD-10-CM | POA: Diagnosis not present

## 2019-07-18 DIAGNOSIS — E118 Type 2 diabetes mellitus with unspecified complications: Secondary | ICD-10-CM | POA: Diagnosis not present

## 2019-07-18 DIAGNOSIS — M150 Primary generalized (osteo)arthritis: Secondary | ICD-10-CM | POA: Diagnosis not present

## 2019-07-18 DIAGNOSIS — I639 Cerebral infarction, unspecified: Secondary | ICD-10-CM | POA: Diagnosis not present

## 2019-07-19 DIAGNOSIS — E118 Type 2 diabetes mellitus with unspecified complications: Secondary | ICD-10-CM | POA: Diagnosis not present

## 2019-07-19 DIAGNOSIS — I639 Cerebral infarction, unspecified: Secondary | ICD-10-CM | POA: Diagnosis not present

## 2019-07-19 DIAGNOSIS — M150 Primary generalized (osteo)arthritis: Secondary | ICD-10-CM | POA: Diagnosis not present

## 2019-07-20 DIAGNOSIS — M150 Primary generalized (osteo)arthritis: Secondary | ICD-10-CM | POA: Diagnosis not present

## 2019-07-20 DIAGNOSIS — E118 Type 2 diabetes mellitus with unspecified complications: Secondary | ICD-10-CM | POA: Diagnosis not present

## 2019-07-20 DIAGNOSIS — I639 Cerebral infarction, unspecified: Secondary | ICD-10-CM | POA: Diagnosis not present

## 2019-07-21 DIAGNOSIS — I639 Cerebral infarction, unspecified: Secondary | ICD-10-CM | POA: Diagnosis not present

## 2019-07-21 DIAGNOSIS — M150 Primary generalized (osteo)arthritis: Secondary | ICD-10-CM | POA: Diagnosis not present

## 2019-07-21 DIAGNOSIS — E118 Type 2 diabetes mellitus with unspecified complications: Secondary | ICD-10-CM | POA: Diagnosis not present

## 2019-07-22 DIAGNOSIS — M150 Primary generalized (osteo)arthritis: Secondary | ICD-10-CM | POA: Diagnosis not present

## 2019-07-22 DIAGNOSIS — E118 Type 2 diabetes mellitus with unspecified complications: Secondary | ICD-10-CM | POA: Diagnosis not present

## 2019-07-22 DIAGNOSIS — I639 Cerebral infarction, unspecified: Secondary | ICD-10-CM | POA: Diagnosis not present

## 2019-07-23 DIAGNOSIS — I639 Cerebral infarction, unspecified: Secondary | ICD-10-CM | POA: Diagnosis not present

## 2019-07-23 DIAGNOSIS — E118 Type 2 diabetes mellitus with unspecified complications: Secondary | ICD-10-CM | POA: Diagnosis not present

## 2019-07-23 DIAGNOSIS — M150 Primary generalized (osteo)arthritis: Secondary | ICD-10-CM | POA: Diagnosis not present

## 2019-07-24 DIAGNOSIS — M150 Primary generalized (osteo)arthritis: Secondary | ICD-10-CM | POA: Diagnosis not present

## 2019-07-24 DIAGNOSIS — E118 Type 2 diabetes mellitus with unspecified complications: Secondary | ICD-10-CM | POA: Diagnosis not present

## 2019-07-24 DIAGNOSIS — I639 Cerebral infarction, unspecified: Secondary | ICD-10-CM | POA: Diagnosis not present

## 2019-07-25 DIAGNOSIS — E118 Type 2 diabetes mellitus with unspecified complications: Secondary | ICD-10-CM | POA: Diagnosis not present

## 2019-07-25 DIAGNOSIS — M150 Primary generalized (osteo)arthritis: Secondary | ICD-10-CM | POA: Diagnosis not present

## 2019-07-25 DIAGNOSIS — I639 Cerebral infarction, unspecified: Secondary | ICD-10-CM | POA: Diagnosis not present

## 2019-07-26 DIAGNOSIS — E118 Type 2 diabetes mellitus with unspecified complications: Secondary | ICD-10-CM | POA: Diagnosis not present

## 2019-07-26 DIAGNOSIS — I639 Cerebral infarction, unspecified: Secondary | ICD-10-CM | POA: Diagnosis not present

## 2019-07-26 DIAGNOSIS — M150 Primary generalized (osteo)arthritis: Secondary | ICD-10-CM | POA: Diagnosis not present

## 2019-07-27 DIAGNOSIS — I639 Cerebral infarction, unspecified: Secondary | ICD-10-CM | POA: Diagnosis not present

## 2019-07-27 DIAGNOSIS — E118 Type 2 diabetes mellitus with unspecified complications: Secondary | ICD-10-CM | POA: Diagnosis not present

## 2019-07-27 DIAGNOSIS — M150 Primary generalized (osteo)arthritis: Secondary | ICD-10-CM | POA: Diagnosis not present

## 2019-07-28 DIAGNOSIS — I639 Cerebral infarction, unspecified: Secondary | ICD-10-CM | POA: Diagnosis not present

## 2019-07-28 DIAGNOSIS — E118 Type 2 diabetes mellitus with unspecified complications: Secondary | ICD-10-CM | POA: Diagnosis not present

## 2019-07-28 DIAGNOSIS — M150 Primary generalized (osteo)arthritis: Secondary | ICD-10-CM | POA: Diagnosis not present

## 2019-07-29 DIAGNOSIS — I639 Cerebral infarction, unspecified: Secondary | ICD-10-CM | POA: Diagnosis not present

## 2019-07-29 DIAGNOSIS — E118 Type 2 diabetes mellitus with unspecified complications: Secondary | ICD-10-CM | POA: Diagnosis not present

## 2019-07-29 DIAGNOSIS — M150 Primary generalized (osteo)arthritis: Secondary | ICD-10-CM | POA: Diagnosis not present

## 2019-07-30 DIAGNOSIS — E118 Type 2 diabetes mellitus with unspecified complications: Secondary | ICD-10-CM | POA: Diagnosis not present

## 2019-07-30 DIAGNOSIS — M150 Primary generalized (osteo)arthritis: Secondary | ICD-10-CM | POA: Diagnosis not present

## 2019-07-30 DIAGNOSIS — I639 Cerebral infarction, unspecified: Secondary | ICD-10-CM | POA: Diagnosis not present

## 2019-07-31 DIAGNOSIS — E118 Type 2 diabetes mellitus with unspecified complications: Secondary | ICD-10-CM | POA: Diagnosis not present

## 2019-07-31 DIAGNOSIS — I639 Cerebral infarction, unspecified: Secondary | ICD-10-CM | POA: Diagnosis not present

## 2019-07-31 DIAGNOSIS — M150 Primary generalized (osteo)arthritis: Secondary | ICD-10-CM | POA: Diagnosis not present

## 2019-08-01 DIAGNOSIS — E118 Type 2 diabetes mellitus with unspecified complications: Secondary | ICD-10-CM | POA: Diagnosis not present

## 2019-08-01 DIAGNOSIS — I639 Cerebral infarction, unspecified: Secondary | ICD-10-CM | POA: Diagnosis not present

## 2019-08-01 DIAGNOSIS — M150 Primary generalized (osteo)arthritis: Secondary | ICD-10-CM | POA: Diagnosis not present

## 2019-08-02 DIAGNOSIS — I639 Cerebral infarction, unspecified: Secondary | ICD-10-CM | POA: Diagnosis not present

## 2019-08-02 DIAGNOSIS — E118 Type 2 diabetes mellitus with unspecified complications: Secondary | ICD-10-CM | POA: Diagnosis not present

## 2019-08-02 DIAGNOSIS — M150 Primary generalized (osteo)arthritis: Secondary | ICD-10-CM | POA: Diagnosis not present

## 2019-08-03 DIAGNOSIS — I639 Cerebral infarction, unspecified: Secondary | ICD-10-CM | POA: Diagnosis not present

## 2019-08-03 DIAGNOSIS — M150 Primary generalized (osteo)arthritis: Secondary | ICD-10-CM | POA: Diagnosis not present

## 2019-08-03 DIAGNOSIS — E118 Type 2 diabetes mellitus with unspecified complications: Secondary | ICD-10-CM | POA: Diagnosis not present

## 2019-08-04 DIAGNOSIS — E118 Type 2 diabetes mellitus with unspecified complications: Secondary | ICD-10-CM | POA: Diagnosis not present

## 2019-08-04 DIAGNOSIS — M150 Primary generalized (osteo)arthritis: Secondary | ICD-10-CM | POA: Diagnosis not present

## 2019-08-04 DIAGNOSIS — I639 Cerebral infarction, unspecified: Secondary | ICD-10-CM | POA: Diagnosis not present

## 2019-08-05 DIAGNOSIS — M150 Primary generalized (osteo)arthritis: Secondary | ICD-10-CM | POA: Diagnosis not present

## 2019-08-05 DIAGNOSIS — I639 Cerebral infarction, unspecified: Secondary | ICD-10-CM | POA: Diagnosis not present

## 2019-08-05 DIAGNOSIS — E118 Type 2 diabetes mellitus with unspecified complications: Secondary | ICD-10-CM | POA: Diagnosis not present

## 2019-08-06 DIAGNOSIS — I639 Cerebral infarction, unspecified: Secondary | ICD-10-CM | POA: Diagnosis not present

## 2019-08-06 DIAGNOSIS — M150 Primary generalized (osteo)arthritis: Secondary | ICD-10-CM | POA: Diagnosis not present

## 2019-08-06 DIAGNOSIS — E118 Type 2 diabetes mellitus with unspecified complications: Secondary | ICD-10-CM | POA: Diagnosis not present

## 2019-08-07 ENCOUNTER — Other Ambulatory Visit: Payer: Self-pay | Admitting: *Deleted

## 2019-08-07 DIAGNOSIS — M150 Primary generalized (osteo)arthritis: Secondary | ICD-10-CM | POA: Diagnosis not present

## 2019-08-07 DIAGNOSIS — E118 Type 2 diabetes mellitus with unspecified complications: Secondary | ICD-10-CM | POA: Diagnosis not present

## 2019-08-07 DIAGNOSIS — I639 Cerebral infarction, unspecified: Secondary | ICD-10-CM | POA: Diagnosis not present

## 2019-08-07 NOTE — Patient Outreach (Signed)
[place] [facility]) Care Management  08/07/2019  Connie Ruiz Nov 17, 1960 [id number]   RN Health Coach Monthly Outreach  Referral Date:  11/28/2018 Referral Source:  [facility] High Risk Screening Reason for Referral:  Disease Management Education Insurance:  [employer]   Outreach Attempt:  Outreach attempt #1 to patient for follow up.  Patient answered and stated she did not fell well and requested a call back on another day.   Plan:  RN Health Coach will make another outreach attempt within the month of April.  [place] [phone] [email or web address]

## 2019-08-08 DIAGNOSIS — M150 Primary generalized (osteo)arthritis: Secondary | ICD-10-CM | POA: Diagnosis not present

## 2019-08-08 DIAGNOSIS — I639 Cerebral infarction, unspecified: Secondary | ICD-10-CM | POA: Diagnosis not present

## 2019-08-08 DIAGNOSIS — E118 Type 2 diabetes mellitus with unspecified complications: Secondary | ICD-10-CM | POA: Diagnosis not present

## 2019-08-09 DIAGNOSIS — E118 Type 2 diabetes mellitus with unspecified complications: Secondary | ICD-10-CM | POA: Diagnosis not present

## 2019-08-09 DIAGNOSIS — I639 Cerebral infarction, unspecified: Secondary | ICD-10-CM | POA: Diagnosis not present

## 2019-08-09 DIAGNOSIS — M150 Primary generalized (osteo)arthritis: Secondary | ICD-10-CM | POA: Diagnosis not present

## 2019-08-10 DIAGNOSIS — E118 Type 2 diabetes mellitus with unspecified complications: Secondary | ICD-10-CM | POA: Diagnosis not present

## 2019-08-10 DIAGNOSIS — M150 Primary generalized (osteo)arthritis: Secondary | ICD-10-CM | POA: Diagnosis not present

## 2019-08-10 DIAGNOSIS — I639 Cerebral infarction, unspecified: Secondary | ICD-10-CM | POA: Diagnosis not present

## 2019-08-11 DIAGNOSIS — E118 Type 2 diabetes mellitus with unspecified complications: Secondary | ICD-10-CM | POA: Diagnosis not present

## 2019-08-11 DIAGNOSIS — I639 Cerebral infarction, unspecified: Secondary | ICD-10-CM | POA: Diagnosis not present

## 2019-08-11 DIAGNOSIS — M150 Primary generalized (osteo)arthritis: Secondary | ICD-10-CM | POA: Diagnosis not present

## 2019-08-12 ENCOUNTER — Emergency Department (HOSPITAL_COMMUNITY): Payer: Medicare Other

## 2019-08-12 ENCOUNTER — Inpatient Hospital Stay (HOSPITAL_COMMUNITY)
Admission: EM | Admit: 2019-08-12 | Discharge: 2019-08-15 | DRG: 056 | Disposition: A | Discharge status: Skilled Nursing Facility | Payer: Medicare Other | Attending: Internal Medicine | Admitting: Internal Medicine

## 2019-08-12 ENCOUNTER — Encounter (HOSPITAL_COMMUNITY): Payer: Self-pay | Admitting: Emergency Medicine

## 2019-08-12 ENCOUNTER — Other Ambulatory Visit: Payer: Self-pay

## 2019-08-12 DIAGNOSIS — M329 Systemic lupus erythematosus, unspecified: Secondary | ICD-10-CM | POA: Diagnosis present

## 2019-08-12 DIAGNOSIS — E785 Hyperlipidemia, unspecified: Secondary | ICD-10-CM | POA: Diagnosis present

## 2019-08-12 DIAGNOSIS — Z7401 Bed confinement status: Secondary | ICD-10-CM | POA: Diagnosis not present

## 2019-08-12 DIAGNOSIS — Z20822 Contact with and (suspected) exposure to covid-19: Secondary | ICD-10-CM | POA: Diagnosis present

## 2019-08-12 DIAGNOSIS — M6259 Muscle wasting and atrophy, not elsewhere classified, multiple sites: Secondary | ICD-10-CM | POA: Diagnosis not present

## 2019-08-12 DIAGNOSIS — E876 Hypokalemia: Secondary | ICD-10-CM | POA: Diagnosis present

## 2019-08-12 DIAGNOSIS — R05 Cough: Secondary | ICD-10-CM | POA: Diagnosis not present

## 2019-08-12 DIAGNOSIS — I421 Obstructive hypertrophic cardiomyopathy: Secondary | ICD-10-CM | POA: Diagnosis present

## 2019-08-12 DIAGNOSIS — I5032 Chronic diastolic (congestive) heart failure: Secondary | ICD-10-CM | POA: Diagnosis present

## 2019-08-12 DIAGNOSIS — I132 Hypertensive heart and chronic kidney disease with heart failure and with stage 5 chronic kidney disease, or end stage renal disease: Secondary | ICD-10-CM | POA: Diagnosis present

## 2019-08-12 DIAGNOSIS — T380X5A Adverse effect of glucocorticoids and synthetic analogues, initial encounter: Secondary | ICD-10-CM | POA: Diagnosis present

## 2019-08-12 DIAGNOSIS — N186 End stage renal disease: Secondary | ICD-10-CM | POA: Diagnosis not present

## 2019-08-12 DIAGNOSIS — Z86711 Personal history of pulmonary embolism: Secondary | ICD-10-CM | POA: Diagnosis not present

## 2019-08-12 DIAGNOSIS — R29810 Facial weakness: Secondary | ICD-10-CM | POA: Diagnosis not present

## 2019-08-12 DIAGNOSIS — G9389 Other specified disorders of brain: Secondary | ICD-10-CM | POA: Diagnosis present

## 2019-08-12 DIAGNOSIS — I6930 Unspecified sequelae of cerebral infarction: Secondary | ICD-10-CM

## 2019-08-12 DIAGNOSIS — Z6831 Body mass index (BMI) 31.0-31.9, adult: Secondary | ICD-10-CM

## 2019-08-12 DIAGNOSIS — Z940 Kidney transplant status: Secondary | ICD-10-CM

## 2019-08-12 DIAGNOSIS — F319 Bipolar disorder, unspecified: Secondary | ICD-10-CM | POA: Diagnosis present

## 2019-08-12 DIAGNOSIS — R2990 Unspecified symptoms and signs involving the nervous system: Secondary | ICD-10-CM

## 2019-08-12 DIAGNOSIS — M6281 Muscle weakness (generalized): Secondary | ICD-10-CM | POA: Diagnosis not present

## 2019-08-12 DIAGNOSIS — Z7952 Long term (current) use of systemic steroids: Secondary | ICD-10-CM

## 2019-08-12 DIAGNOSIS — R4701 Aphasia: Secondary | ICD-10-CM | POA: Diagnosis present

## 2019-08-12 DIAGNOSIS — E039 Hypothyroidism, unspecified: Secondary | ICD-10-CM | POA: Diagnosis present

## 2019-08-12 DIAGNOSIS — Z91040 Latex allergy status: Secondary | ICD-10-CM

## 2019-08-12 DIAGNOSIS — F419 Anxiety disorder, unspecified: Secondary | ICD-10-CM | POA: Diagnosis present

## 2019-08-12 DIAGNOSIS — G8191 Hemiplegia, unspecified affecting right dominant side: Secondary | ICD-10-CM | POA: Diagnosis not present

## 2019-08-12 DIAGNOSIS — Z949 Transplanted organ and tissue status, unspecified: Secondary | ICD-10-CM | POA: Diagnosis not present

## 2019-08-12 DIAGNOSIS — I69320 Aphasia following cerebral infarction: Secondary | ICD-10-CM | POA: Diagnosis not present

## 2019-08-12 DIAGNOSIS — I69351 Hemiplegia and hemiparesis following cerebral infarction affecting right dominant side: Principal | ICD-10-CM

## 2019-08-12 DIAGNOSIS — Z86718 Personal history of other venous thrombosis and embolism: Secondary | ICD-10-CM | POA: Diagnosis not present

## 2019-08-12 DIAGNOSIS — Z800 Family history of malignant neoplasm of digestive organs: Secondary | ICD-10-CM

## 2019-08-12 DIAGNOSIS — R791 Abnormal coagulation profile: Secondary | ICD-10-CM

## 2019-08-12 DIAGNOSIS — I480 Paroxysmal atrial fibrillation: Secondary | ICD-10-CM | POA: Diagnosis present

## 2019-08-12 DIAGNOSIS — F0150 Vascular dementia without behavioral disturbance: Secondary | ICD-10-CM | POA: Diagnosis present

## 2019-08-12 DIAGNOSIS — E119 Type 2 diabetes mellitus without complications: Secondary | ICD-10-CM | POA: Diagnosis not present

## 2019-08-12 DIAGNOSIS — D84821 Immunodeficiency due to drugs: Secondary | ICD-10-CM | POA: Diagnosis not present

## 2019-08-12 DIAGNOSIS — K219 Gastro-esophageal reflux disease without esophagitis: Secondary | ICD-10-CM | POA: Diagnosis not present

## 2019-08-12 DIAGNOSIS — E669 Obesity, unspecified: Secondary | ICD-10-CM | POA: Diagnosis present

## 2019-08-12 DIAGNOSIS — I2510 Atherosclerotic heart disease of native coronary artery without angina pectoris: Secondary | ICD-10-CM | POA: Diagnosis not present

## 2019-08-12 DIAGNOSIS — Z8042 Family history of malignant neoplasm of prostate: Secondary | ICD-10-CM

## 2019-08-12 DIAGNOSIS — R1311 Dysphagia, oral phase: Secondary | ICD-10-CM | POA: Diagnosis not present

## 2019-08-12 DIAGNOSIS — R531 Weakness: Secondary | ICD-10-CM | POA: Diagnosis not present

## 2019-08-12 DIAGNOSIS — M109 Gout, unspecified: Secondary | ICD-10-CM | POA: Diagnosis present

## 2019-08-12 DIAGNOSIS — I639 Cerebral infarction, unspecified: Secondary | ICD-10-CM | POA: Diagnosis not present

## 2019-08-12 DIAGNOSIS — Z888 Allergy status to other drugs, medicaments and biological substances status: Secondary | ICD-10-CM

## 2019-08-12 DIAGNOSIS — M79601 Pain in right arm: Secondary | ICD-10-CM | POA: Diagnosis not present

## 2019-08-12 DIAGNOSIS — Z992 Dependence on renal dialysis: Secondary | ICD-10-CM | POA: Diagnosis not present

## 2019-08-12 DIAGNOSIS — D6861 Antiphospholipid syndrome: Secondary | ICD-10-CM | POA: Diagnosis not present

## 2019-08-12 DIAGNOSIS — I10 Essential (primary) hypertension: Secondary | ICD-10-CM | POA: Diagnosis not present

## 2019-08-12 DIAGNOSIS — I4581 Long QT syndrome: Secondary | ICD-10-CM | POA: Diagnosis present

## 2019-08-12 DIAGNOSIS — Z79899 Other long term (current) drug therapy: Secondary | ICD-10-CM | POA: Diagnosis not present

## 2019-08-12 DIAGNOSIS — M810 Age-related osteoporosis without current pathological fracture: Secondary | ICD-10-CM | POA: Diagnosis not present

## 2019-08-12 DIAGNOSIS — E0822 Diabetes mellitus due to underlying condition with diabetic chronic kidney disease: Secondary | ICD-10-CM | POA: Diagnosis not present

## 2019-08-12 DIAGNOSIS — R29818 Other symptoms and signs involving the nervous system: Secondary | ICD-10-CM | POA: Diagnosis not present

## 2019-08-12 DIAGNOSIS — Z743 Need for continuous supervision: Secondary | ICD-10-CM | POA: Diagnosis not present

## 2019-08-12 DIAGNOSIS — Z885 Allergy status to narcotic agent status: Secondary | ICD-10-CM

## 2019-08-12 DIAGNOSIS — I499 Cardiac arrhythmia, unspecified: Secondary | ICD-10-CM | POA: Diagnosis not present

## 2019-08-12 DIAGNOSIS — I69391 Dysphagia following cerebral infarction: Secondary | ICD-10-CM | POA: Diagnosis not present

## 2019-08-12 DIAGNOSIS — E1122 Type 2 diabetes mellitus with diabetic chronic kidney disease: Secondary | ICD-10-CM | POA: Diagnosis not present

## 2019-08-12 DIAGNOSIS — Z79890 Hormone replacement therapy (postmenopausal): Secondary | ICD-10-CM

## 2019-08-12 DIAGNOSIS — Z91048 Other nonmedicinal substance allergy status: Secondary | ICD-10-CM

## 2019-08-12 DIAGNOSIS — M2550 Pain in unspecified joint: Secondary | ICD-10-CM | POA: Diagnosis not present

## 2019-08-12 DIAGNOSIS — I69315 Cognitive social or emotional deficit following cerebral infarction: Secondary | ICD-10-CM | POA: Diagnosis not present

## 2019-08-12 DIAGNOSIS — Z7984 Long term (current) use of oral hypoglycemic drugs: Secondary | ICD-10-CM

## 2019-08-12 DIAGNOSIS — M79604 Pain in right leg: Secondary | ICD-10-CM | POA: Diagnosis not present

## 2019-08-12 DIAGNOSIS — I82409 Acute embolism and thrombosis of unspecified deep veins of unspecified lower extremity: Secondary | ICD-10-CM | POA: Diagnosis not present

## 2019-08-12 DIAGNOSIS — I444 Left anterior fascicular block: Secondary | ICD-10-CM | POA: Diagnosis present

## 2019-08-12 DIAGNOSIS — Z7901 Long term (current) use of anticoagulants: Secondary | ICD-10-CM | POA: Diagnosis not present

## 2019-08-12 DIAGNOSIS — Z882 Allergy status to sulfonamides status: Secondary | ICD-10-CM

## 2019-08-12 DIAGNOSIS — Z825 Family history of asthma and other chronic lower respiratory diseases: Secondary | ICD-10-CM

## 2019-08-12 DIAGNOSIS — E890 Postprocedural hypothyroidism: Secondary | ICD-10-CM | POA: Diagnosis present

## 2019-08-12 DIAGNOSIS — R29707 NIHSS score 7: Secondary | ICD-10-CM | POA: Diagnosis present

## 2019-08-12 DIAGNOSIS — Z8249 Family history of ischemic heart disease and other diseases of the circulatory system: Secondary | ICD-10-CM

## 2019-08-12 LAB — PROTIME-INR
INR: 1 (ref 0.8–1.2)
Prothrombin Time: 13.3 seconds (ref 11.4–15.2)

## 2019-08-12 LAB — I-STAT CHEM 8, ED
BUN: 13 mg/dL (ref 6–20)
Calcium, Ion: 1.12 mmol/L — ABNORMAL LOW (ref 1.15–1.40)
Chloride: 106 mmol/L (ref 98–111)
Creatinine, Ser: 0.6 mg/dL (ref 0.44–1.00)
Glucose, Bld: 141 mg/dL — ABNORMAL HIGH (ref 70–99)
HCT: 45 % (ref 36.0–46.0)
Hemoglobin: 15.3 g/dL — ABNORMAL HIGH (ref 12.0–15.0)
Potassium: 3.4 mmol/L — ABNORMAL LOW (ref 3.5–5.1)
Sodium: 139 mmol/L (ref 135–145)
TCO2: 25 mmol/L (ref 22–32)

## 2019-08-12 LAB — COMPREHENSIVE METABOLIC PANEL
ALT: 14 U/L (ref 0–44)
AST: 18 U/L (ref 15–41)
Albumin: 3.9 g/dL (ref 3.5–5.0)
Alkaline Phosphatase: 72 U/L (ref 38–126)
Anion gap: 13 (ref 5–15)
BUN: 12 mg/dL (ref 6–20)
CO2: 22 mmol/L (ref 22–32)
Calcium: 9.6 mg/dL (ref 8.9–10.3)
Chloride: 107 mmol/L (ref 98–111)
Creatinine, Ser: 0.86 mg/dL (ref 0.44–1.00)
GFR calc Af Amer: >60 mL/min (ref >60)
GFR calc non Af Amer: >60 mL/min (ref >60)
Glucose, Bld: 146 mg/dL — ABNORMAL HIGH (ref 70–99)
Potassium: 3.4 mmol/L — ABNORMAL LOW (ref 3.5–5.1)
Sodium: 142 mmol/L (ref 135–145)
Total Bilirubin: 0.8 mg/dL (ref 0.3–1.2)
Total Protein: 7.3 g/dL (ref 6.5–8.1)

## 2019-08-12 LAB — HEMOGLOBIN A1C
Hgb A1c MFr Bld: 7.3 % — ABNORMAL HIGH (ref 4.8–5.6)
Mean Plasma Glucose: 162.81 mg/dL

## 2019-08-12 LAB — CBC
HCT: 44.9 % (ref 36.0–46.0)
Hemoglobin: 14.4 g/dL (ref 12.0–15.0)
MCH: 30.1 pg (ref 26.0–34.0)
MCHC: 32.1 g/dL (ref 30.0–36.0)
MCV: 93.9 fL (ref 80.0–100.0)
Platelets: 213 10*3/uL (ref 150–400)
RBC: 4.78 MIL/uL (ref 3.87–5.11)
RDW: 15.7 % — ABNORMAL HIGH (ref 11.5–15.5)
WBC: 11.2 10*3/uL — ABNORMAL HIGH (ref 4.0–10.5)
nRBC: 0 % (ref 0.0–0.2)

## 2019-08-12 LAB — I-STAT BETA HCG BLOOD, ED (MC, WL, AP ONLY): I-stat hCG, quantitative: 7.5 m[IU]/mL — ABNORMAL HIGH (ref <5)

## 2019-08-12 LAB — DIFFERENTIAL
Abs Immature Granulocytes: 0.06 10*3/uL (ref 0.00–0.07)
Basophils Absolute: 0.1 10*3/uL (ref 0.0–0.1)
Basophils Relative: 1 %
Eosinophils Absolute: 0 10*3/uL (ref 0.0–0.5)
Eosinophils Relative: 0 %
Immature Granulocytes: 1 %
Lymphocytes Relative: 38 %
Lymphs Abs: 4.3 10*3/uL — ABNORMAL HIGH (ref 0.7–4.0)
Monocytes Absolute: 0.7 10*3/uL (ref 0.1–1.0)
Monocytes Relative: 6 %
Neutro Abs: 6.1 10*3/uL (ref 1.7–7.7)
Neutrophils Relative %: 54 %

## 2019-08-12 LAB — URINALYSIS, ROUTINE W REFLEX MICROSCOPIC
Bilirubin Urine: NEGATIVE
Glucose, UA: NEGATIVE mg/dL
Hgb urine dipstick: NEGATIVE
Ketones, ur: NEGATIVE mg/dL
Leukocytes,Ua: NEGATIVE
Nitrite: NEGATIVE
Protein, ur: NEGATIVE mg/dL
Specific Gravity, Urine: 1.013 (ref 1.005–1.030)
pH: 8 (ref 5.0–8.0)

## 2019-08-12 LAB — APTT: aPTT: 31 seconds (ref 24–36)

## 2019-08-12 LAB — RAPID URINE DRUG SCREEN, HOSP PERFORMED
Amphetamines: NOT DETECTED
Barbiturates: NOT DETECTED
Benzodiazepines: NOT DETECTED
Cocaine: NOT DETECTED
Opiates: NOT DETECTED
Tetrahydrocannabinol: NOT DETECTED

## 2019-08-12 LAB — GLUCOSE, CAPILLARY: Glucose-Capillary: 115 mg/dL — ABNORMAL HIGH (ref 70–99)

## 2019-08-12 LAB — SARS CORONAVIRUS 2 (TAT 6-24 HRS): SARS Coronavirus 2: NEGATIVE

## 2019-08-12 MED ORDER — WARFARIN - PHARMACIST DOSING INPATIENT: Freq: Every day | Status: DC

## 2019-08-12 MED ORDER — LOSARTAN POTASSIUM 50 MG PO TABS
100.0000 mg | ORAL_TABLET | Freq: Every day | ORAL | Status: DC
  Administered 2019-08-13 – 2019-08-15 (×3): 100 mg via ORAL
  Filled 2019-08-12 (×4): qty 2

## 2019-08-12 MED ORDER — DILTIAZEM HCL ER COATED BEADS 180 MG PO CP24
360.0000 mg | ORAL_CAPSULE | Freq: Every day | ORAL | Status: DC
  Administered 2019-08-13 – 2019-08-15 (×3): 360 mg via ORAL
  Filled 2019-08-12 (×3): qty 2

## 2019-08-12 MED ORDER — INSULIN ASPART 100 UNIT/ML ~~LOC~~ SOLN: 0.0000 [IU] | Freq: Every day | SUBCUTANEOUS | Status: DC

## 2019-08-12 MED ORDER — MIRTAZAPINE 30 MG PO TABS
30.0000 mg | ORAL_TABLET | Freq: Every day | ORAL | Status: DC
  Administered 2019-08-13 – 2019-08-14 (×2): 30 mg via ORAL
  Filled 2019-08-12: qty 1
  Filled 2019-08-12 (×2): qty 2
  Filled 2019-08-12 (×3): qty 1

## 2019-08-12 MED ORDER — STROKE: EARLY STAGES OF RECOVERY BOOK
Freq: Once | Status: AC
  Filled 2019-08-12: qty 1

## 2019-08-12 MED ORDER — ACETAMINOPHEN 650 MG RE SUPP
650.0000 mg | Freq: Once | RECTAL | Status: AC
  Administered 2019-08-12: 20:00:00 650 mg via RECTAL
  Filled 2019-08-12: qty 1

## 2019-08-12 MED ORDER — ENOXAPARIN SODIUM 40 MG/0.4ML ~~LOC~~ SOLN: 40.0000 mg | SUBCUTANEOUS | Status: DC

## 2019-08-12 MED ORDER — TACROLIMUS 1 MG PO CAPS
1.0000 mg | ORAL_CAPSULE | Freq: Every day | ORAL | Status: DC
  Filled 2019-08-12 (×2): qty 1

## 2019-08-12 MED ORDER — TACROLIMUS 1 MG PO CAPS
2.0000 mg | ORAL_CAPSULE | Freq: Every day | ORAL | Status: DC
  Administered 2019-08-13: 2 mg via ORAL
  Filled 2019-08-12 (×2): qty 2

## 2019-08-12 MED ORDER — WARFARIN SODIUM 5 MG PO TABS: 5.0000 mg | ORAL_TABLET | Freq: Once | ORAL | Status: DC

## 2019-08-12 MED ORDER — KETOROLAC TROMETHAMINE 15 MG/ML IJ SOLN
15.0000 mg | Freq: Once | INTRAMUSCULAR | Status: AC
  Administered 2019-08-13: 15 mg via INTRAVENOUS
  Filled 2019-08-12: qty 1

## 2019-08-12 MED ORDER — DILTIAZEM HCL ER BEADS 240 MG PO CP24
360.0000 mg | ORAL_CAPSULE | Freq: Every day | ORAL | Status: DC
  Filled 2019-08-12: qty 1

## 2019-08-12 MED ORDER — PANTOPRAZOLE SODIUM 40 MG PO TBEC
40.0000 mg | DELAYED_RELEASE_TABLET | Freq: Every day | ORAL | Status: DC
  Administered 2019-08-13 – 2019-08-15 (×3): 40 mg via ORAL
  Filled 2019-08-12 (×4): qty 1

## 2019-08-12 MED ORDER — SODIUM CHLORIDE 0.9% FLUSH
3.0000 mL | Freq: Once | INTRAVENOUS | Status: AC
  Administered 2019-08-12: 13:00:00 3 mL via INTRAVENOUS

## 2019-08-12 MED ORDER — INSULIN ASPART 100 UNIT/ML ~~LOC~~ SOLN
0.0000 [IU] | Freq: Three times a day (TID) | SUBCUTANEOUS | Status: DC
  Administered 2019-08-14: 5 [IU] via SUBCUTANEOUS
  Administered 2019-08-14: 2 [IU] via SUBCUTANEOUS
  Administered 2019-08-14 – 2019-08-15 (×2): 3 [IU] via SUBCUTANEOUS

## 2019-08-12 MED ORDER — WARFARIN SODIUM 2.5 MG PO TABS: 2.5000 mg | ORAL_TABLET | Freq: Every day | ORAL | Status: DC

## 2019-08-12 MED ORDER — DONEPEZIL HCL 10 MG PO TABS
10.0000 mg | ORAL_TABLET | Freq: Every day | ORAL | Status: DC
  Administered 2019-08-13 – 2019-08-14 (×2): 10 mg via ORAL
  Filled 2019-08-12 (×2): qty 1

## 2019-08-12 MED ORDER — HEPARIN (PORCINE) 25000 UT/250ML-% IV SOLN
1200.0000 [IU]/h | INTRAVENOUS | Status: DC
  Administered 2019-08-13: 1200 [IU]/h via INTRAVENOUS
  Filled 2019-08-12: qty 250

## 2019-08-12 MED ORDER — PREDNISONE 5 MG PO TABS
5.0000 mg | ORAL_TABLET | Freq: Every day | ORAL | Status: DC
  Administered 2019-08-14 – 2019-08-15 (×2): 5 mg via ORAL
  Filled 2019-08-12 (×2): qty 1

## 2019-08-12 MED ORDER — HEPARIN BOLUS VIA INFUSION
4000.0000 [IU] | Freq: Once | INTRAVENOUS | Status: AC
  Administered 2019-08-13: 01:00:00 4000 [IU] via INTRAVENOUS
  Filled 2019-08-12: qty 4000

## 2019-08-12 MED ORDER — ACETAMINOPHEN 500 MG PO TABS: 1000.0000 mg | ORAL_TABLET | Freq: Once | ORAL | Status: DC

## 2019-08-12 MED ORDER — MYCOPHENOLATE MOFETIL 250 MG PO CAPS
250.0000 mg | ORAL_CAPSULE | Freq: Two times a day (BID) | ORAL | Status: DC
  Administered 2019-08-13 – 2019-08-15 (×5): 250 mg via ORAL
  Filled 2019-08-12 (×7): qty 1

## 2019-08-12 MED ORDER — ATORVASTATIN CALCIUM 40 MG PO TABS
40.0000 mg | ORAL_TABLET | Freq: Every day | ORAL | Status: DC
  Administered 2019-08-13 – 2019-08-14 (×2): 40 mg via ORAL
  Filled 2019-08-12 (×2): qty 1

## 2019-08-12 MED ORDER — QUETIAPINE FUMARATE 25 MG PO TABS
25.0000 mg | ORAL_TABLET | Freq: Every day | ORAL | Status: DC
  Administered 2019-08-13 – 2019-08-14 (×2): 25 mg via ORAL
  Filled 2019-08-12 (×2): qty 1

## 2019-08-12 MED ORDER — POTASSIUM CHLORIDE 20 MEQ PO PACK
20.0000 meq | PACK | Freq: Once | ORAL | Status: DC
  Filled 2019-08-12: qty 1

## 2019-08-12 MED ORDER — LEVOTHYROXINE SODIUM 75 MCG PO TABS: 175.0000 ug | ORAL_TABLET | Freq: Every day | ORAL | Status: DC

## 2019-08-12 MED ORDER — FUROSEMIDE 20 MG PO TABS
20.0000 mg | ORAL_TABLET | Freq: Every day | ORAL | Status: DC
  Administered 2019-08-13 – 2019-08-15 (×3): 20 mg via ORAL
  Filled 2019-08-12 (×4): qty 1

## 2019-08-12 MED ORDER — SERTRALINE HCL 50 MG PO TABS
50.0000 mg | ORAL_TABLET | Freq: Every day | ORAL | Status: DC
  Administered 2019-08-13 – 2019-08-15 (×3): 50 mg via ORAL
  Filled 2019-08-12 (×4): qty 1

## 2019-08-12 NOTE — ED Triage Notes (Signed)
Per ems, pt from home. [id number]. Pt reports she was sitting on her bed this morning when she noticed she had difficulty speaking and increased right arm weakness. Pt has hx of strokes with right sided deficits but reports this was increased from baseline. Pt has right sided restriction. Hx of Lupus, dialysis and kidney transplant. Pt takes Coumadin.

## 2019-08-12 NOTE — ED Notes (Signed)
Pt transported to Xray. 

## 2019-08-12 NOTE — ED Provider Notes (Signed)
[facility] EMERGENCY DEPARTMENT Provider Note   CSN: [id number] Arrival date & time: 08/12/19  1057  An emergency department physician performed an initial assessment on this suspected stroke patient at 1059.  History Chief Complaint  Patient presents with  . Code Stroke    Connie Ruiz is a 60 y.o. female presenting for evaluation of worsened speech difficulty and right-sided weakness.  Patient states that 830 this morning she had acute onset worsening right-sided weakness and speech difficulty.  She has a history of right-sided deficits and speech deficits due to a previous stroke.  She is on Coumadin.  She denies fall or injury.  She reports fever and cough yesterday, no fever today.  She reports some mild nausea, no vomiting.  She denies shortness of breath, chest pain, abdominal pain, urinary symptoms, abnormal bowel movements.  She has a history of a kidney transplant, is on immunosuppression.  She reports a history of A. fib which is paroxysmal, this is not documented in her chart.   Additional history obtained from chart review.  Patient with a history of previous CVA, CHF, DVT/PE on Coumadin, diabetes, hypertension, hyperlipidemia, status post kidney transplant due to lupus, vascular dementia.   HPI     Past Medical History:  Diagnosis Date  . Anxiety   . Candida esophagitis ([place]) 11/12/2014  . CEREBROVASCULAR ACCIDENT, ACUTE 04/15/2010  . CLOSTRIDIUM DIFFICILE COLITIS, HX OF 08/21/2007  . CONGESTIVE HEART FAILURE 08/21/2007  . Current use of long term anticoagulation    Dr. Andree Elk, [facility]  . CVA 04/17/2010  . Depression    Dr. Andree Elk, [place], TYPE II 08/21/2007  . DVT, HX OF 08/21/2007  . GERD 08/21/2007  . GOUT 08/21/2007  . History of stroke with residual effects   . HYPERLIPIDEMIA 08/21/2007  . HYPERTENSION 08/21/2007   Dr. Andree Elk, [place], HX OF 08/22/2007   s/p renal transplant-Dr. Andree Elk, [place]  08/21/2007  . OSTEOPOROSIS 08/21/2007   Rheumatol at [facility]  . Pulmonary embolism ([place]) 07/16/2010  . Renal failure   . RENAL INSUFFICIENCY 08/21/2007  . Right sided weakness   . Steroid-induced hyperglycemia 11/09/2014  . Tachycardia   . THYROID NODULE, LEFT 04/10/2009    Patient Active Problem List   Diagnosis Date Noted  . 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 Hypothyroidism 01/14/2018  . HLD (hyperlipidemia) 01/14/2018  . CVA (cerebral vascular accident) ([place]) 01/12/2018  . Chest pain 01/11/2018  . Acute respiratory failure with hypoxia ([place]) 07/20/2017  . Abdominal bloating 07/20/2017  . Sinus tachycardia 07/20/2017  . SLE (systemic lupus erythematosus related syndrome) ([place]) 07/20/2017  . CAP (community acquired pneumonia) 07/16/2017  . Long term (current) use of anticoagulants [Z79.01] 06/21/2017  . Screening examination for infectious disease 01/28/2016  . Type 2 diabetes mellitus ([place]) 08/03/2015  . Urinary tract infection 07/20/2015  . Vascular dementia ([place]) 05/08/2015  . Hemiparesis as late effect of cerebrovascular accident (CVA) ([place]) 05/08/2015  . Aphasia as late effect of stroke 05/08/2015  . C. difficile colitis 02/25/2015  . Hypokalemia 02/25/2015  . CKD (chronic kidney disease) 02/25/2015  . Colitis 02/14/2015  . Sepsis ([place]) 02/14/2015  . Nausea vomiting and diarrhea 02/14/2015  . Abdominal pain 02/14/2015  . UTI (lower urinary tract infection) 02/14/2015  . Abdominal pain, chronic, generalized 01/07/2015  . Candida esophagitis ([place]) 11/12/2014  . Abnormal CT of the abdomen   . Steroid-induced hyperglycemia 11/09/2014  . Hypomagnesemia 11/06/2014  . Abdominal pain, acute   .  Supratherapeutic INR 11/02/2014  . Jejunitis 11/02/2014  . Myalgia and myositis 04/05/2014  . Wellness examination 04/05/2014  . Menopausal state 04/05/2014  . Palpitations 08/31/2013  . Hyperlipidemia 08/31/2013  . Disorder of heart rhythm 08/13/2013  . TIA (transient  ischemic attack) 06/20/2013  . Gout flare: R elbow and R shoulder 06/20/2013  . Acute encephalopathy 06/14/2013  . Rhabdomyolysis 06/14/2013  . History of stroke with residual effects   . Right sided weakness   . Breast mass, left 04/30/2013  . Contusion of knee, left 10/18/2012  . Depression 10/05/2012  . Encounter for long-term (current) use of other medications 10/04/2012  . Multiple thyroid nodules 06/08/2012  . Risk for falls 05/05/2012  . Physical deconditioning 05/05/2012  . Fever 04/27/2012  . DVT (deep venous thrombosis) ([place]) 06/17/2011  . Expressive aphasia 06/17/2011  . Dysphasia 06/11/2011  . Incontinence of urine 06/11/2011  . Hand pain, left 06/11/2011  . Immunosuppressive management encounter following kidney transplant 05/26/2011  . Hypertension goal BP (blood pressure) < 130/80 05/26/2011  . Deceased-donor kidney transplant recipient 05/26/11  . Arm pain, left 05/07/2011  . Dysfunctional voiding of urine 04/28/2011  . Urge incontinence 04/28/2011  . Urinary urgency 04/28/2011  . Routine general medical examination at a health care facility 04/13/2011  . Anticoagulated 01/08/2011  . Pulmonary embolism ([place]) 07/16/2010  . Cerebral artery occlusion with cerebral infarction ([place]) 04/17/2010  . CEREBROVASCULAR ACCIDENT, ACUTE 04/15/2010  . THYROID NODULE, LEFT 04/10/2009  . Cough 03/26/2009  . ACUTE BRONCHITIS 08/22/2007  . KIDNEY TRANSPLANTATION, HX OF 08/22/2007  . Gout 08/21/2007  . Essential hypertension 08/21/2007  . Chronic diastolic congestive heart failure ([place]) 08/21/2007  . GERD 08/21/2007  . Disorder resulting from impaired renal function 08/21/2007  . LUPUS 08/21/2007  . Osteoporosis 08/21/2007  . DVT, HX OF 08/21/2007  . Enteritis due to Clostridium difficile 08/21/2007    Past Surgical History:  Procedure Laterality Date  . CESAREAN SECTION    . CHOLECYSTECTOMY    . ENTEROSCOPY N/A 11/11/2014   Procedure: ENTEROSCOPY;  Surgeon: Ladene Artist, MD;  Location: [facility] ENDOSCOPY;  Service: Endoscopy;  Laterality: N/A;  . KIDNEY TRANSPLANT Right 2009  . RENAL BIOPSY, OPEN  1981  . TUBAL LIGATION       OB History    Gravida  1   Para  1   Term  1   Preterm      AB      Living  1     SAB      TAB      Ectopic      Multiple      Live Births  1           Family History  Problem Relation Age of Onset  . Heart attack Mother   . Heart disease Father   . Asthma Sister   . Asthma Daughter   . Cancer Maternal Grandfather        prostate  . Cancer Paternal Grandfather        colon    Social History   Tobacco Use  . Smoking status: Never Smoker  . Smokeless tobacco: Never Used  Substance Use Topics  . Alcohol use: No    Alcohol/week: 0.0 standard drinks  . Drug use: No    Home Medications Prior to Admission medications   Medication Sig Start Date End Date Taking? Authorizing Provider  acetaminophen (TYLENOL) 500 MG tablet Take 1,000 mg by mouth daily as needed for  mild pain.   Yes [provider]  alendronate (FOSAMAX) 70 MG tablet TAKE 1 TABLET BY MOUTH ONCE A WEEK ON AN EMPTY STOMACH ON SATURDAYS WITH A FULL GLASS OF WATER. Patient taking differently: Take 70 mg by mouth once a week.  09/17/17  Yes Renato Shin, MD  atorvastatin (LIPITOR) 40 MG tablet Take 40 mg by mouth daily at 6 PM.  07/26/17  Yes [provider]  b complex-vitamin c-folic acid (NEPHRO-VITE) 0.8 MG TABS tablet Take 1 tablet by mouth daily. 05/05/12  Yes [provider]  calcitRIOL (ROCALTROL) 0.25 MCG capsule TAKE 1 CAPSULE(0.25 MCG) BY MOUTH DAILY. NO REFILLS WITHOUT APPOINTMENT Patient taking differently: Take 0.25 mcg by mouth daily. TAKE 1 CAPSULE(0.25 MCG) BY MOUTH DAILY. NO REFILLS WITHOUT APPOINTMENT 02/13/19  Yes Renato Shin, MD  diltiazem [facility]) 360 MG 24 hr capsule Take 1 capsule (360 mg total) by mouth daily. 06/27/19  Yes [name], [name], MD  donepezil (ARICEPT) 10 MG tablet Take 10 mg by  mouth at bedtime.  11/18/16  Yes [provider]  esomeprazole (NEXIUM) 20 MG capsule TAKE 1 CAPSULE(20 MG) BY MOUTH DAILY Patient taking differently: Take 20 mg by mouth daily at 12 noon.  07/17/18  Yes Renato Shin, MD  feeding supplement, ENSURE ENLIVE, (ENSURE ENLIVE) LIQD Take 237 mLs by mouth 2 (two) times daily between meals. 08/22/18  Yes Pokhrel, Laxman, MD  furosemide (LASIX) 20 MG tablet Take 20 mg by mouth daily. 04/25/19  Yes [provider]  levothyroxine (SYNTHROID) 175 MCG tablet TAKE 1 TABLET(175 MCG) BY MOUTH DAILY BEFORE BREAKFAST Patient taking differently: Take 175 mcg by mouth daily before breakfast.  05/15/19  Yes [name], [name], MD  losartan (COZAAR) 100 MG tablet Take 1 tablet (100 mg total) by mouth daily. 01/03/19  Yes [name], [name], MD  magnesium oxide (MAG-OX) 400 MG tablet Take 800 mg by mouth daily.  01/11/17  Yes [provider]  metFORMIN (GLUCOPHAGE-XR) 500 MG 24 hr tablet TAKE 1 TABLET(500 MG) BY MOUTH DAILY WITH BREAKFAST Patient taking differently: Take 500 mg by mouth daily with breakfast.  06/05/19  Yes [name], [name], MD  mirtazapine (REMERON) 30 MG tablet Take 1 tablet (30 mg total) by mouth at bedtime. 07/28/15  Yes Tomi Likens, Adam R, DO  mycophenolate (CELLCEPT) 250 MG capsule Take 250 mg by mouth 2 (two) times daily.  07/13/17  Yes [provider]  nystatin (MYCOSTATIN) powder Apply 1 g topically 4 (four) times daily as needed. For yeast under breast   Yes [provider]  ondansetron (ZOFRAN ODT) 4 MG disintegrating tablet 49m ODT q4 hours prn nausea/vomiting Patient taking differently: Take 4 mg by mouth every 4 (four) hours as needed for nausea or vomiting.  06/15/15  Yes Mesner, JCorene Cornea MD  Potassium Chloride ER 20 MEQ TBCR Take 1 tablet by mouth daily. NEEDS POTASSIUM RE-CHECK. 06/27/19  Yes JMartinique [name], MD  predniSONE (DELTASONE) 5 MG tablet Take 5 mg by mouth daily with breakfast.  07/13/17  Yes [provider]  QUEtiapine (SEROQUEL) 25 MG tablet Take 1 tablet (25 mg total) by mouth at bedtime. 07/28/15  Yes Jaffe, Adam R, DO  sertraline (ZOLOFT) 100 MG tablet Take 0.5 tablets (50 mg total) by mouth daily. Patient taking differently: Take 50 mg by mouth daily as needed (anxiety).  01/25/17  Yes ERenato Shin MD  tacrolimus (PROGRAF) 1 MG capsule Take 1-2 mg by mouth See admin instructions. 2 mg in the am  and 1 mg at night 01/15/18  Yes [provider]  warfarin (COUMADIN) 5 MG tablet TAKE 1/2 TO 1 TABLET BY MOUTH ON MONDAY THROUGH FRIDAY; [place], [place], [place], [place] SUNDAY-2.5MG Patient taking differently: Take 2.5-5 mg by mouth daily at 6 PM.  03/27/19  Yes [name], [name], MD  Blood Glucose Monitoring Suppl (ACCU-CHEK AVIVA PLUS) w/Device KIT 1 Device by Does not apply route daily. 10/13/18   [name], [name], MD  glucose blood (ONE TOUCH ULTRA TEST) test strip Use to check blood sugar 1 time per day 02/12/15   Renato Shin, MD  [facility] DELICA LANCETS 85I MISC Use to check blood sugar 1 time per day 02/12/15   Renato Shin, MD  sucralfate (CARAFATE) 1 g tablet Take 1 tablet (1 g total) by mouth 4 (four) times daily for 30 days. Patient not taking: Reported on 08/12/2019 08/22/18 09/21/18  Flora Lipps, MD    Allergies    Oxycodone-acetaminophen, Propoxyphene, Propoxyphene n-acetaminophen, Sulfonamide derivatives, Codeine, Hydrocodone-acetaminophen, Hydromorphone, Other, Oxycodone, Sulfamethoxazole, Tape, Gabapentin, Latex, Metoprolol, Morphine and related, and Rosiglitazone  Review of Systems   Review of Systems  Neurological: Positive for speech difficulty and weakness.  All other systems reviewed and are negative.   Physical Exam Updated Vital Signs BP (!) 142/108   Pulse 98   Temp 98.5 F (36.9 C) (Oral)   Resp 16   Ht '5\' 4"'  (1.626 m)   Wt 83.1 kg   SpO2 98%   BMI 31.45 kg/m   Physical Exam Vitals and nursing note reviewed.  Constitutional:      General:  She is not in acute distress.    Appearance: She is well-developed.     Comments: Obese female resting comfortably in the bed in no acute distress  HENT:     Head: Normocephalic and atraumatic.  Eyes:     Extraocular Movements: Extraocular movements intact.     Conjunctiva/sclera: Conjunctivae normal.     Pupils: Pupils are equal, round, and reactive to light.  Cardiovascular:     Rate and Rhythm: Normal rate and regular rhythm.     Pulses: Normal pulses.  Pulmonary:     Effort: Pulmonary effort is normal. No respiratory distress.     Breath sounds: Normal breath sounds. No wheezing.     Comments: Clear lung sounds in all fields Abdominal:     General: There is no distension.     Palpations: Abdomen is soft. There is no mass.     Tenderness: There is no abdominal tenderness. There is no guarding or rebound.  Musculoskeletal:        General: Normal range of motion.     Cervical back: Normal range of motion and neck supple.     Comments: Right upper and lower extremity weakness.  Radial and pedal 2+ bilaterally.  No leg pain swelling.  Skin:    General: Skin is warm and dry.     Capillary Refill: Capillary refill takes less than 2 seconds.  Neurological:     Mental Status: She is alert.     Comments: Patient with limited/selective verbal responses.  Prefers to nod yes or no, or gesticulate as opposed to respond verbally. Right upper and lower extremity weakness, history of weakness, side.  Right-sided facial droop, history of the same.  No obvious visual field deficit.     ED Results / Procedures / Treatments   Labs (all labs ordered are listed, but only abnormal results are displayed) Labs Reviewed  CBC -  Abnormal; Notable for the following components:      Result Value   WBC 11.2 (*)    RDW 15.7 (*)    All other components within normal limits  DIFFERENTIAL - Abnormal; Notable for the following components:   Lymphs Abs 4.3 (*)    All other components within normal limits   COMPREHENSIVE METABOLIC PANEL - Abnormal; Notable for the following components:   Potassium 3.4 (*)    Glucose, Bld 146 (*)    All other components within normal limits  URINALYSIS, ROUTINE W REFLEX MICROSCOPIC - Abnormal; Notable for the following components:   APPearance CLOUDY (*)    All other components within normal limits  HEMOGLOBIN A1C - Abnormal; Notable for the following components:   Hgb A1c MFr Bld 7.3 (*)    All other components within normal limits  I-STAT CHEM 8, ED - Abnormal; Notable for the following components:   Potassium 3.4 (*)    Glucose, Bld 141 (*)    Calcium, Ion 1.12 (*)    Hemoglobin 15.3 (*)    All other components within normal limits  I-STAT BETA HCG BLOOD, ED ([facility], [facility], [facility] ONLY) - Abnormal; Notable for the following components:   I-stat hCG, quantitative 7.5 (*)    All other components within normal limits  SARS CORONAVIRUS 2 (TAT 6-24 HRS)  PROTIME-INR  APTT  LIPID PANEL  RAPID URINE DRUG SCREEN, HOSP PERFORMED  TSH  CBG MONITORING, ED    EKG EKG Interpretation  Date/Time:  Sunday August 12 2019 11:28:21 EST Ventricular Rate:  100 PR Interval:    QRS Duration: 108 QT Interval:  381 QTC Calculation: 492 R Axis:   -57 Text Interpretation: Atrial fibrillation LAD, consider left anterior fascicular block Abnormal R-wave progression, early transition Left ventricular hypertrophy Borderline prolonged QT interval Artifact Since last tracing Atrial fibrillation is new Otherwise no significant change Confirmed by Daleen Bo [phone]) on 08/12/2019 12:15:11 PM    Radiology DG Chest 2 View  Result Date: 08/12/2019 CLINICAL DATA:  Cough. EXAM: CHEST - 2 VIEW COMPARISON:  08/14/2018 FINDINGS: Heart size is at the upper limits of normal and stable. Aortic atherosclerosis incidentally noted. Both lungs are clear. Surgical clips again seen in lower neck and superior mediastinum. IMPRESSION: No active cardiopulmonary disease. Electronically Signed   By: Marlaine Hind M.D.   On: 08/12/2019 14:19   MR BRAIN WO CONTRAST  Result Date: 08/12/2019 CLINICAL DATA:  Code stroke follow-up EXAM: MRI HEAD WITHOUT CONTRAST TECHNIQUE: Multiplanar, multiecho pulse sequences of the brain and surrounding structures were obtained without intravenous contrast. COMPARISON:  2015 FINDINGS: Motion artifact is present. Brain: There is no acute infarction or intracranial hemorrhage. There is no intracranial mass, mass effect, or edema. There is no hydrocephalus or extra-axial fluid collection. Chronic left MCA territory infarction is present involving the frontoparietal lobes, insula, and basal ganglia. There is ex vacuo dilatation of the left lateral ventricle. Chronic blood products are present in association with the basal ganglia portion. There are additional multiple chronic small cerebellar infarcts. Additional patchy T2 hyperintensity in the supratentorial white matter is nonspecific but probably reflects mild chronic microvascular ischemic changes. Vascular: Major vessel flow voids at the skull base are preserved. Skull and upper cervical spine: Normal marrow signal is preserved. Sinuses/Orbits: Paranasal sinuses are aerated. Orbits are unremarkable. Other: Sella is unremarkable.  Mastoid air cells are clear. IMPRESSION: No acute infarction, hemorrhage, or mass. Chronic left MCA territory infarction.  Chronic cerebellar infarcts. Electronically Signed  By: Macy Mis M.D.   On: 08/12/2019 12:46   CT HEAD CODE STROKE WO CONTRAST  Result Date: 08/12/2019 CLINICAL DATA:  Code stroke.  Right-sided weakness EXAM: CT HEAD WITHOUT CONTRAST TECHNIQUE: Contiguous axial images were obtained from the base of the skull through the vertex without intravenous contrast. COMPARISON:  2016 FINDINGS: Brain: There is no acute intracranial hemorrhage, mass effect, or edema. Chronic left MCA territory infarction is again identified primarily involving frontoparietal lobes, insula, and basal  ganglia with ex vacuo dilatation of the adjacent left lateral ventricle. Multiple chronic cerebellar infarcts are also seen. There is no acute appearing loss of gray differentiation. Additional patchy and confluent hypoattenuation in the supratentorial white matter is nonspecific but probably reflects moderate chronic microvascular ischemic changes. There is no hydrocephalus. Vascular: No hyperdense vessel. Intracranial atherosclerotic calcification is present at the skull base. Skull: Unremarkable. Sinuses/Orbits: No acute abnormality. Other: Mastoid air cells are clear. ASPECTS ([place] Stroke Program Early CT Score) - Ganglionic level infarction (caudate, lentiform nuclei, internal capsule, insula, M1-M3 cortex): 7 - Supraganglionic infarction (M4-M6 cortex): 3 Total score (0-10 with 10 being normal): 10 IMPRESSION: No acute intracranial hemorrhage or evidence of acute infarction. ASPECT score is 10 on the right. Chronic left MCA territory infarction. Chronic cerebellar infarcts. Moderate chronic microvascular ischemic changes. These results were communicated to Dr. Lorraine Lax at 11:16 amon 3/7/2021by text page via the [facility] messaging system. Electronically Signed   By: Macy Mis M.D.   On: 08/12/2019 11:20    Procedures Procedures (including critical care time)  Medications Ordered in ED Medications  acetaminophen (TYLENOL) tablet 1,000 mg (0 mg Oral Hold 08/12/19 1738)  acetaminophen (TYLENOL) suppository 650 mg (has no administration in time range)   stroke: mapping our early stages of recovery book (has no administration in time range)  enoxaparin (LOVENOX) injection 40 mg (has no administration in time range)  atorvastatin (LIPITOR) tablet 40 mg (has no administration in time range)  diltiazem (TIAZAC) 24 hr capsule 360 mg (has no administration in time range)  furosemide (LASIX) tablet 20 mg (has no administration in time range)  losartan (COZAAR) tablet 100 mg (has no administration in time  range)  donepezil (ARICEPT) tablet 10 mg (has no administration in time range)  mirtazapine (REMERON) tablet 30 mg (has no administration in time range)  QUEtiapine (SEROQUEL) tablet 25 mg (has no administration in time range)  sertraline (ZOLOFT) tablet 50 mg (has no administration in time range)  levothyroxine (SYNTHROID) tablet 175 mcg (has no administration in time range)  predniSONE (DELTASONE) tablet 5 mg (has no administration in time range)  pantoprazole (PROTONIX) EC tablet 40 mg (has no administration in time range)  warfarin (COUMADIN) tablet 2.5-5 mg (has no administration in time range)  tacrolimus (PROGRAF) capsule 1-2 mg (has no administration in time range)  insulin aspart (novoLOG) injection 0-15 Units (has no administration in time range)  insulin aspart (novoLOG) injection 0-5 Units (has no administration in time range)  sodium chloride flush (NS) 0.9 % injection 3 mL (3 mLs Intravenous Given 08/12/19 1309)    ED Course  I have reviewed the triage vital signs and the nursing notes.  Pertinent labs & imaging results that were available during my care of the patient were reviewed by me and considered in my medical decision making (see chart for details).    MDM Rules/Calculators/A&P                      Patient presenting  for evaluation of worsened right-sided weakness and speech difficulty.  This began acutely at the 30 this morning.  Upon arrival to the ER, code stroke was called.  On my evaluation, patient has weakness of the right upper and lower extremity.  Has a history of weakness, patient states it is worse with movement.  She so most completely aphasic, occasionally will speak words.  CT head negative for acute abnormalities or bleed.  Will obtain labs for metabolic work-up, urine, chest x-ray, and MRI per neurology recommendation.  MRI negative for acute findings.  Chest x-ray viewed interpreted by me, no pneumonia pneumothorax or effusion.  Labs interpreted by me,  overall reassuring.  No significant metabolic abnormality.  Mild leukocytosis at 11, patient without fever or infectious symptoms.  Of note, patient with subtherapeutic INR.Marland Kitchen EKG c/w a fib, pt reports h/o this.   Patient remains with continued right-sided weakness.  Unable to ambulate at this time due to weakness.  I am concerned about patient's safety to go home, she lives at home by herself and she is nonambulatory at this time.  Will consult with PT and social work.  Discussed with Dr. Lorraine Lax from neurology who states patient may be having an abnormal presentation of a seizure, that was likely.  If symptoms not proving, consider EEG tomorrow.  Consider migraine, will give Tylenol, as patient is unable to have Toradol due to taking Coumadin.  Will call for admission to hospital due to weakness and for further evaluation. Case discussed with attending, Dr. Eulis Foster evaluated the pt.   Discussed with Dr. Doristine Bosworth from [facility]ad hospitalist service, patient to be admitted.  Final Clinical Impression(s) / ED Diagnoses Final diagnoses:  Right sided weakness  Aphasia  Subtherapeutic international normalized ratio (INR)    Rx / DC Orders ED Discharge Orders    None       Franchot Heidelberg, PA-C 08/12/19 1853    Daleen Bo, MD 08/12/19 2019

## 2019-08-12 NOTE — NC FL2 (Signed)
[place] LEVEL OF CARE SCREENING TOOL     IDENTIFICATION  Patient Name: Connie Ruiz Birthdate: 1960-11-11 Sex: female Admission Date (Current Location): 08/12/2019  [facility] and [place] Number:  Herbalist and Address:  [facility] [place] [place] [address] [place], [place] [address]      Provider Number: [id number]  Attending Physician Name and Address:  Daleen Bo, MD  Relative Name and Phone Number:  Tzivia Oneil, [phone]    Current Level of Care: Hospital Recommended Level of Care: [place] Prior Approval Number:    Date Approved/Denied: 09/25/18 PASRR Number: [id number]  Discharge Plan: Home    Current Diagnoses: Patient Active Problem List   Diagnosis Date Noted  . 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 Hypothyroidism 01/14/2018  . HLD (hyperlipidemia) 01/14/2018  . CVA (cerebral vascular accident) ([place]) 01/12/2018  . Chest pain 01/11/2018  . Acute respiratory failure with hypoxia ([place]) 07/20/2017  . Abdominal bloating 07/20/2017  . Sinus tachycardia 07/20/2017  . SLE (systemic lupus erythematosus related syndrome) ([place]) 07/20/2017  . CAP (community acquired pneumonia) 07/16/2017  . Long term (current) use of anticoagulants [Z79.01] 06/21/2017  . Screening examination for infectious disease 01/28/2016  . Type 2 diabetes mellitus ([place]) 08/03/2015  . Urinary tract infection 07/20/2015  . Vascular dementia ([place]) 05/08/2015  . Hemiparesis as late effect of cerebrovascular accident (CVA) ([place]) 05/08/2015  . Aphasia as late effect of stroke 05/08/2015  . C. difficile colitis 02/25/2015  . Hypokalemia 02/25/2015  . CKD (chronic kidney disease) 02/25/2015  . Colitis 02/14/2015  . Sepsis ([place]) 02/14/2015  . Nausea vomiting and diarrhea 02/14/2015  . Abdominal pain 02/14/2015  . UTI (lower urinary tract infection) 02/14/2015  . Abdominal pain, chronic, generalized 01/07/2015  . Candida  esophagitis ([place]) 11/12/2014  . Abnormal CT of the abdomen   . Steroid-induced hyperglycemia 11/09/2014  . Hypomagnesemia 11/06/2014  . Abdominal pain, acute   . Supratherapeutic INR 11/02/2014  . Jejunitis 11/02/2014  . Myalgia and myositis 04/05/2014  . Wellness examination 04/05/2014  . Menopausal state 04/05/2014  . Palpitations 08/31/2013  . Hyperlipidemia 08/31/2013  . Disorder of heart rhythm 08/13/2013  . TIA (transient ischemic attack) 06/20/2013  . Gout flare: R elbow and R shoulder 06/20/2013  . Acute encephalopathy 06/14/2013  . Rhabdomyolysis 06/14/2013  . History of stroke with residual effects   . Right sided weakness   . Breast mass, left 04/30/2013  . Contusion of knee, left 10/18/2012  . Depression 10/05/2012  . Encounter for long-term (current) use of other medications 10/04/2012  . Multiple thyroid nodules 06/08/2012  . Risk for falls 05/05/2012  . Physical deconditioning 05/05/2012  . Fever 04/27/2012  . DVT (deep venous thrombosis) ([place]) 06/17/2011  . Expressive aphasia 06/17/2011  . Dysphasia 06/11/2011  . Incontinence of urine 06/11/2011  . Hand pain, left 06/11/2011  . Immunosuppressive management encounter following kidney transplant 05-22-11  . Hypertension goal BP (blood pressure) < 130/80 05-22-11  . Deceased-donor kidney transplant recipient 05-22-11  . Arm pain, left 05/07/2011  . Dysfunctional voiding of urine 04/28/2011  . Urge incontinence 04/28/2011  . Urinary urgency 04/28/2011  . Routine general medical examination at a health care facility 04/13/2011  . Anticoagulated 01/08/2011  . Pulmonary embolism ([place]) 07/16/2010  . Cerebral artery occlusion with cerebral infarction ([place]) 04/17/2010  . CEREBROVASCULAR ACCIDENT, ACUTE 04/15/2010  . THYROID NODULE, LEFT 04/10/2009  . Cough 03/26/2009  . ACUTE BRONCHITIS 08/22/2007  . KIDNEY TRANSPLANTATION, HX OF  08/22/2007  . Gout 08/21/2007  . Essential hypertension 08/21/2007  .  Chronic diastolic congestive heart failure ([place]) 08/21/2007  . GERD 08/21/2007  . Disorder resulting from impaired renal function 08/21/2007  . LUPUS 08/21/2007  . Osteoporosis 08/21/2007  . DVT, HX OF 08/21/2007  . Enteritis due to Clostridium difficile 08/21/2007    Orientation RESPIRATION BLADDER Height & Weight     Self, Time, Situation, Place  Normal Continent Weight: 183 lb 3.2 oz (83.1 kg) Height:  5' 4" (162.6 cm)  BEHAVIORAL SYMPTOMS/MOOD NEUROLOGICAL BOWEL NUTRITION STATUS      Continent    AMBULATORY STATUS COMMUNICATION OF NEEDS Skin   Extensive Assist Verbally(Significantly impaired) Normal                       Personal Care Assistance Level of Assistance  Bathing, Feeding, Dressing Bathing Assistance: Maximum assistance Feeding assistance: Limited assistance Dressing Assistance: Maximum assistance     Functional Limitations Info  Speech     Speech Info: Impaired(Aphasia from stroke)    SPECIAL CARE FACTORS FREQUENCY  PT (By licensed PT), OT (By licensed OT)     PT Frequency: 5x weekly OT Frequency: 5x weekly            Contractures      Additional Factors Info  Code Status, Allergies Code Status Info: Prior Allergies Info: See attached records           Current Medications (08/12/2019):  This is the current hospital active medication list No current facility-administered medications for this encounter.   Current Outpatient Medications  Medication Sig Dispense Refill  . acetaminophen (TYLENOL) 500 MG tablet Take 1,000 mg by mouth daily as needed for mild pain.    Marland Kitchen alendronate (FOSAMAX) 70 MG tablet TAKE 1 TABLET BY MOUTH ONCE A WEEK ON AN EMPTY STOMACH ON SATURDAYS WITH A FULL GLASS OF WATER. (Patient taking differently: Take 70 mg by mouth once a week. ) 12 tablet 0  . atorvastatin (LIPITOR) 40 MG tablet Take 40 mg by mouth daily at 6 PM.     . b complex-vitamin c-folic acid (NEPHRO-VITE) 0.8 MG TABS tablet Take 1 tablet by mouth  daily.    . calcitRIOL (ROCALTROL) 0.25 MCG capsule TAKE 1 CAPSULE(0.25 MCG) BY MOUTH DAILY. NO REFILLS WITHOUT APPOINTMENT (Patient taking differently: Take 0.25 mcg by mouth daily. TAKE 1 CAPSULE(0.25 MCG) BY MOUTH DAILY. NO REFILLS WITHOUT APPOINTMENT) 30 capsule 0  . diltiazem (TIAZAC) 360 MG 24 hr capsule Take 1 capsule (360 mg total) by mouth daily. 90 capsule 1  . donepezil (ARICEPT) 10 MG tablet Take 10 mg by mouth at bedtime.     Marland Kitchen esomeprazole (NEXIUM) 20 MG capsule TAKE 1 CAPSULE(20 MG) BY MOUTH DAILY (Patient taking differently: Take 20 mg by mouth daily at 12 noon. ) 30 capsule 11  . feeding supplement, ENSURE ENLIVE, (ENSURE ENLIVE) LIQD Take 237 mLs by mouth 2 (two) times daily between meals.    . furosemide (LASIX) 20 MG tablet Take 20 mg by mouth daily.    Marland Kitchen levothyroxine (SYNTHROID) 175 MCG tablet TAKE 1 TABLET(175 MCG) BY MOUTH DAILY BEFORE BREAKFAST (Patient taking differently: Take 175 mcg by mouth daily before breakfast. ) 30 tablet 0  . losartan (COZAAR) 100 MG tablet Take 1 tablet (100 mg total) by mouth daily. 90 tablet 1  . magnesium oxide (MAG-OX) 400 MG tablet Take 800 mg by mouth daily.     . metFORMIN (GLUCOPHAGE-XR) 500 MG 24  hr tablet TAKE 1 TABLET(500 MG) BY MOUTH DAILY WITH BREAKFAST (Patient taking differently: Take 500 mg by mouth daily with breakfast. ) 90 tablet 0  . mirtazapine (REMERON) 30 MG tablet Take 1 tablet (30 mg total) by mouth at bedtime. 30 tablet 1  . mycophenolate (CELLCEPT) 250 MG capsule Take 250 mg by mouth 2 (two) times daily.     Marland Kitchen nystatin (MYCOSTATIN) powder Apply 1 g topically 4 (four) times daily as needed. For yeast under breast    . ondansetron (ZOFRAN ODT) 4 MG disintegrating tablet 41m ODT q4 hours prn nausea/vomiting (Patient taking differently: Take 4 mg by mouth every 4 (four) hours as needed for nausea or vomiting. ) 15 tablet 0  . Potassium Chloride ER 20 MEQ TBCR Take 1 tablet by mouth daily. NEEDS POTASSIUM RE-CHECK. 90 tablet 1   . predniSONE (DELTASONE) 5 MG tablet Take 5 mg by mouth daily with breakfast.     . QUEtiapine (SEROQUEL) 25 MG tablet Take 1 tablet (25 mg total) by mouth at bedtime. 30 tablet 1  . sertraline (ZOLOFT) 100 MG tablet Take 0.5 tablets (50 mg total) by mouth daily. (Patient taking differently: Take 50 mg by mouth daily as needed (anxiety). ) 15 tablet 2  . tacrolimus (PROGRAF) 1 MG capsule Take 1-2 mg by mouth See admin instructions. 2 mg in the am and 1 mg at night    . warfarin (COUMADIN) 5 MG tablet TAKE 1/2 TO 1 TABLET BY MOUTH ON MONDAY THROUGH FRIDAY; TUESDAY, WEDNESDAY, THURSDAY, SATAND SUNDAY-2.5MG (Patient taking differently: Take 2.5-5 mg by mouth daily at 6 PM. ) 50 tablet 0  . Blood Glucose Monitoring Suppl (ACCU-CHEK AVIVA PLUS) w/Device KIT 1 Device by Does not apply route daily. 1 kit 1  . glucose blood (ONE TOUCH ULTRA TEST) test strip Use to check blood sugar 1 time per day 100 each 2  . ONETOUCH DELICA LANCETS 335TMISC Use to check blood sugar 1 time per day 100 each 2  . sucralfate (CARAFATE) 1 g tablet Take 1 tablet (1 g total) by mouth 4 (four) times daily for 30 days. (Patient not taking: Reported on 08/12/2019)       Discharge Medications: Please see discharge summary for a list of discharge medications.  Relevant Imaging Results:  Relevant Lab Results:   Additional Information ssn:[id number]  JOretha Milch LCSW

## 2019-08-12 NOTE — Progress Notes (Signed)
CSW received consult for SNF placement versus home health placement. CSW notes PT has been consulted. CSW will follow-up pending their recommendation.

## 2019-08-12 NOTE — ED Notes (Signed)
Urine culture tube sent with urinalysis if needed for add on later.

## 2019-08-12 NOTE — Progress Notes (Signed)
ANTICOAGULATION CONSULT NOTE - Initial Consult  Pharmacy Consult for warfarin + heparin Indication: pulmonary embolus, DVT, stroke and APS  Allergies  Allergen Reactions  . Oxycodone-Acetaminophen Shortness Of Breath and Nausea Only  . Propoxyphene Nausea Only and Shortness Of Breath    States takes tylenol at home  . Propoxyphene N-Acetaminophen Shortness Of Breath and Nausea Only  . Sulfonamide Derivatives Shortness Of Breath and Nausea Only  . Codeine Nausea Only  . Hydrocodone-Acetaminophen   . Hydromorphone   . Other Other (See Comments)    No blood, Jehovaeh Witness   . Oxycodone   . Sulfamethoxazole   . Tape     Redness**PAPER TAPE OK**  . Gabapentin Anxiety    twitching  . Latex Rash  . Metoprolol Rash  . Morphine And Related Rash    IV site on arm is red, patient reports this is improving.  NO shortness of breath reported.  . Rosiglitazone Rash    Patient Measurements: Height: 5\' 4"  (162.6 cm) Weight: 183 lb 3.2 oz (83.1 kg) IBW/kg (Calculated) : 54.7 Heparin Dosing Weight: 72.8kg  Vital Signs: Temp: 98.2 F (36.8 C) (03/07 2051) Temp Source: Oral (03/07 2051) BP: 149/100 (03/07 2051) Pulse Rate: 72 (03/07 1817)  Labs: Recent Labs    08/12/19 1103 08/12/19 1106  HGB 14.4 15.3*  HCT 44.9 45.0  PLT 213  --   APTT 31  --   LABPROT 13.3  --   INR 1.0  --   CREATININE 0.86 0.60    Estimated Creatinine Clearance: 80 mL/min (by C-G formula based on SCr of 0.6 mg/dL).   Medical History: Past Medical History:  Diagnosis Date  . Anxiety   . Candida esophagitis ([place]) 11/12/2014  . CEREBROVASCULAR ACCIDENT, ACUTE 04/15/2010  . CLOSTRIDIUM DIFFICILE COLITIS, HX OF 08/21/2007  . CONGESTIVE HEART FAILURE 08/21/2007  . Current use of long term anticoagulation    Dr. Andree Elk, [facility]  . CVA 04/17/2010  . Depression    Dr. Andree Elk, [place], TYPE II 08/21/2007  . DVT, HX OF 08/21/2007  . GERD 08/21/2007  . GOUT 08/21/2007  . History of stroke  with residual effects   . HYPERLIPIDEMIA 08/21/2007  . HYPERTENSION 08/21/2007   Dr. Andree Elk, [place], HX OF 08/22/2007   s/p renal transplant-Dr. Andree Elk, [place] 08/21/2007  . OSTEOPOROSIS 08/21/2007   Rheumatol at [facility]  . Pulmonary embolism ([place]) 07/16/2010  . Renal failure   . RENAL INSUFFICIENCY 08/21/2007  . Right sided weakness   . Steroid-induced hyperglycemia 11/09/2014  . Tachycardia   . THYROID NODULE, LEFT 04/10/2009    Medications:  Infusions:  . heparin      Assessment: 70 yof presented to the ED with slurred speech and worsening right sided weakness. Code Stroke was activated but MRI was negative for an acute stroke. Pt is on chronic warfarin for history VTE, stroke and antiphospholipid syndrome. INR is subtherapeutic at 1. To continue warfarin and add heparin to bridge her. Baseline CBC is WNL. No bleeding noted.   Goal of Therapy:  INR 2-3 Heparin level 0.3-0.7 units/ml Monitor platelets by anticoagulation protocol: Yes   Plan:  Heparin bolus 4000 units IV x 1 Heparin gtt 1200 units/hr Check a 6 hr heparin level Warfarin 5mg  PO x 1 tonight Daily INR, heparin level and CBC  Kaylana Fenstermacher, Rande Lawman 08/12/2019,8:58 PM

## 2019-08-12 NOTE — H&P (Addendum)
History and Physical    Connie Ruiz:[id number] DOB: [birth year] DOA: 08/12/2019  PCP: [name], [name], MD  Patient coming from: Home I have personally briefly reviewed patient's old medical records in [place]  Chief Complaint: Slurred speech and worsening right-sided weakness   HPI: Connie Ruiz is a 59 y.o. 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  Patient has history of left MCA stroke with right sided residual weakness.  Patient tells me that she had worsening of right-sided weakness and had slurred speech started 830 this morning.  Has nausea and blurry vision however no history of head trauma, loss of consciousness, seizure, headache, chest pain, shortness of breath, palpitation.  She also tells me that she had fever of 101 yesterday no association with urinary symptoms, diarrhea, cough or congestion or recent COVID-19 exposure.  No history of smoking, alcohol, recent drug use.  She lives alone at home.  ED Course: Upon arrival: Patient's blood pressure in 150s over 100s, afebrile, leukocytosis of 11.2, PT/INR/APTT: WNL, BMP shows potassium of 3.4, UA negative for infection, COVID-19 pending, MRI came back negative for acute findings, chest x-ray negative.  EDP consulted neurology for further evaluation and management.  [facility] hospitalist consulted for admission.  Review of Systems: As per HPI otherwise negative.    Past Medical History:  Diagnosis Date  . Anxiety   . Candida esophagitis ([place]) 11/12/2014  . CEREBROVASCULAR  ACCIDENT, ACUTE 04/15/2010  . CLOSTRIDIUM DIFFICILE COLITIS, HX OF 08/21/2007  . CONGESTIVE HEART FAILURE 08/21/2007  . Current use of long term anticoagulation    Dr. Andree Elk, [facility]  . CVA 04/17/2010  . Depression    Dr. Andree Elk, [place], TYPE II 08/21/2007  . DVT, HX OF 08/21/2007  . GERD 08/21/2007  . GOUT 08/21/2007  . History of stroke with residual effects   . HYPERLIPIDEMIA 08/21/2007  . HYPERTENSION 08/21/2007   Dr. Andree Elk, [place], HX OF 08/22/2007   s/p renal transplant-Dr. Andree Elk, [place] 08/21/2007  . OSTEOPOROSIS 08/21/2007   Rheumatol at [facility]  . Pulmonary embolism ([place]) 07/16/2010  . Renal failure   . RENAL INSUFFICIENCY 08/21/2007  . Right sided weakness   . Steroid-induced hyperglycemia 11/09/2014  . Tachycardia   . THYROID NODULE, LEFT 04/10/2009    Past Surgical History:  Procedure Laterality Date  . CESAREAN SECTION    . CHOLECYSTECTOMY    . ENTEROSCOPY N/A 11/11/2014   Procedure: ENTEROSCOPY;  Surgeon: Ladene Artist, MD;  Location: [facility] END[facility]COPY;  Service: Endoscopy;  Laterality: N/A;  . KIDNEY TRANSPLANT Right 2009  . RENAL BIOPSY, OPEN  1981  . TUBAL LIGATION       reports that she has never smoked. She has never used smokeless tobacco. She reports that she does not drink alcohol or use drugs.  Allergies  Allergen Reactions  . Oxycodone-Acetaminophen Shortness Of Breath and Nausea Only  . Propoxyphene Nausea Only and Shortness Of Breath    States takes tylenol at home  . Propoxyphene N-Acetaminophen Shortness Of Breath and Nausea Only  . Sulfonamide Derivatives Shortness Of Breath and Nausea Only  . Codeine Nausea Only  .  Hydrocodone-Acetaminophen   . Hydromorphone   . Other Other (See Comments)    No blood, Jehovaeh Witness   . Oxycodone   . Sulfamethoxazole   . Tape     Redness**PAPER TAPE OK**  . Gabapentin Anxiety    twitching  . Latex Rash  . Metoprolol Rash  . Morphine And Related Rash     IV site on arm is red, patient reports this is improving.  NO shortness of breath reported.  . Rosiglitazone Rash    Family History  Problem Relation Age of Onset  . Heart attack Mother   . Heart disease Father   . Asthma Sister   . Asthma Daughter   . Cancer Maternal Grandfather        prostate  . Cancer Paternal Grandfather        colon    Prior to Admission medications   Medication Sig Start Date End Date Taking? Authorizing Provider  acetaminophen (TYLENOL) 500 MG tablet Take 1,000 mg by mouth daily as needed for mild pain.   Yes [provider]  alendronate (FOSAMAX) 70 MG tablet TAKE 1 TABLET BY MOUTH ONCE A WEEK ON AN EMPTY STOMACH ON SATURDAYS WITH A FULL GLASS OF WATER. Patient taking differently: Take 70 mg by mouth once a week.  09/17/17  Yes Renato Shin, MD  atorvastatin (LIPITOR) 40 MG tablet Take 40 mg by mouth daily at 6 PM.  07/26/17  Yes [provider]  b complex-vitamin c-folic acid (NEPHRO-VITE) 0.8 MG TABS tablet Take 1 tablet by mouth daily. 05/05/12  Yes [provider]  calcitRIOL (ROCALTROL) 0.25 MCG capsule TAKE 1 CAPSULE(0.25 MCG) BY MOUTH DAILY. NO REFILLS WITHOUT APPOINTMENT Patient taking differently: Take 0.25 mcg by mouth daily. TAKE 1 CAPSULE(0.25 MCG) BY MOUTH DAILY. NO REFILLS WITHOUT APPOINTMENT 02/13/19  Yes Renato Shin, MD  diltiazem [facility]) 360 MG 24 hr capsule Take 1 capsule (360 mg total) by mouth daily. 06/27/19  Yes [name], [name], MD  donepezil (ARICEPT) 10 MG tablet Take 10 mg by mouth at bedtime.  11/18/16  Yes [provider]  esomeprazole (NEXIUM) 20 MG capsule TAKE 1 CAPSULE(20 MG) BY MOUTH DAILY Patient taking differently: Take 20 mg by mouth daily at 12 noon.  07/17/18  Yes Renato Shin, MD  feeding supplement, ENSURE ENLIVE, (ENSURE ENLIVE) LIQD Take 237 mLs by mouth 2 (two) times daily between meals. 08/22/18  Yes Pokhrel, Laxman, MD  furosemide (LASIX) 20 MG tablet Take 20 mg by mouth daily.  04/25/19  Yes [provider]  levothyroxine (SYNTHROID) 175 MCG tablet TAKE 1 TABLET(175 MCG) BY MOUTH DAILY BEFORE BREAKFAST Patient taking differently: Take 175 mcg by mouth daily before breakfast.  05/15/19  Yes [name], [name], MD  losartan (COZAAR) 100 MG tablet Take 1 tablet (100 mg total) by mouth daily. 01/03/19  Yes [name], [name], MD  magnesium oxide (MAG-OX) 400 MG tablet Take 800 mg by mouth daily.  01/11/17  Yes [provider]  metFORMIN (GLUCOPHAGE-XR) 500 MG 24 hr tablet TAKE 1 TABLET(500 MG) BY MOUTH DAILY WITH BREAKFAST Patient taking differently: Take 500 mg by mouth daily with breakfast.  06/05/19  Yes [name], [name], MD  mirtazapine (REMERON) 30 MG tablet Take 1 tablet (30 mg total) by mouth at bedtime. 07/28/15  Yes Tomi Likens, Adam R, DO  mycophenolate (CELLCEPT) 250 MG capsule Take 250 mg by mouth 2 (two) times daily.  07/13/17  Yes [provider]  nystatin (MYCOSTATIN) powder Apply 1 g  topically 4 (four) times daily as needed. For yeast under breast   Yes [provider]  ondansetron (ZOFRAN ODT) 4 MG disintegrating tablet 60m ODT q4 hours prn nausea/vomiting Patient taking differently: Take 4 mg by mouth every 4 (four) hours as needed for nausea or vomiting.  06/15/15  Yes Mesner, JCorene Cornea MD  Potassium Chloride ER 20 MEQ TBCR Take 1 tablet by mouth daily. NEEDS POTASSIUM RE-CHECK. 06/27/19  Yes JMartinique [name], MD  predniSONE (DELTASONE) 5 MG tablet Take 5 mg by mouth daily with breakfast.  07/13/17  Yes [provider]  QUEtiapine (SEROQUEL) 25 MG tablet Take 1 tablet (25 mg total) by mouth at bedtime. 07/28/15  Yes Jaffe, Adam R, DO  sertraline (ZOLOFT) 100 MG tablet Take 0.5 tablets (50 mg total) by mouth daily. Patient taking differently: Take 50 mg by mouth daily as needed (anxiety).  01/25/17  Yes ERenato Shin MD  tacrolimus (PROGRAF) 1 MG capsule Take 1-2 mg by mouth See admin instructions. 2 mg in the am and 1 mg at night 01/15/18  Yes  [provider]  warfarin (COUMADIN) 5 MG tablet TAKE 1/2 TO 1 TABLET BY MOUTH ON MONDAY THROUGH FRIDAY; [place]-2.5MG Patient taking differently: Take 2.5-5 mg by mouth daily at 6 PM.  03/27/19  Yes JMartinique [name], MD  Blood Glucose Monitoring Suppl (ACCU-CHEK AVIVA PLUS) w/Device KIT 1 Device by Does not apply route daily. 10/13/18   JMartinique [name], MD  glucose blood (ONE TOUCH ULTRA TEST) test strip Use to check blood sugar 1 time per day 02/12/15   ERenato Shin MD  OThe [facility] For Minimally Invasive SurgeryDELICA LANCETS 375TMISC Use to check blood sugar 1 time per day 02/12/15   ERenato Shin MD  sucralfate (CARAFATE) 1 g tablet Take 1 tablet (1 g total) by mouth 4 (four) times daily for 30 days. Patient not taking: Reported on 08/12/2019 08/22/18 09/21/18  PFlora Lipps MD    Physical Exam: Vitals:   08/12/19 1448 08/12/19 1500 08/12/19 1523 08/12/19 1600  BP: (!) 152/99 (!) 141/102  (!) 142/108  Pulse: 88 94 98   Resp: _0 (!) 22  Temp:      TempSrc:      SpO2: 100% 98% 98%   Weight:      Height:        Constitutional: NAD, calm, comfortable Eyes: PERRL, lids and conjunctivae normal ENMT: Mucous membranes are moist. Posterior pharynx clear of any exudate or lesions.Normal dentition.  Neck: normal, supple, no masses, no thyromegaly Respiratory: clear to auscultation bilaterally, no wheezing, no crackles. Normal respiratory effort. No accessory muscle use.  Cardiovascular: Regular rate and rhythm, no murmurs / rubs / gallops. No extremity edema. 2+ pedal pulses. No carotid bruits.  Abdomen: no tenderness, no masses palpated. No hepatosplenomegaly. Bowel sounds positive.  Musculoskeletal: no clubbing / cyanosis. No joint deformity upper and lower extremities. Good ROM, no contractures. Normal muscle tone.  Skin: no rashes, lesions, ulcers. No induration Neurologic: Expressive aphasia noted, right-sided facial droop, power 3 out of 5 in right upper and lower  extremity and 5 out of 5 in left upper and lower extremity, sensation intact.   Psychiatric: Normal judgment and insight. Alert and oriented x 3. Normal mood.    Labs on Admission: I have personally reviewed following labs and imaging studies  CBC: Recent Labs  Lab 08/12/19 1103 08/12/19 1106  WBC 11.2*  --   NEUTROABS 6.1  --   HGB 14.4 15.3*  HCT 44.9 45.0  MCV 93.9  --   PLT 213  --    Basic Metabolic Panel: Recent Labs  Lab 08/12/19 1103 08/12/19 1106  NA 142 139  K 3.4* 3.4*  CL 107 106  CO2 22  --   GLUCOSE 146* 141*  BUN 12 13  CREATININE 0.86 0.60  CALCIUM 9.6  --    GFR: Estimated Creatinine Clearance: 80 mL/min (by C-G formula based on SCr of 0.6 mg/dL). Liver Function Tests: Recent Labs  Lab 08/12/19 1103  AST 18  ALT 14  ALKPHOS 72  BILITOT 0.8  PROT 7.3  ALBUMIN 3.9   No results for input(s): LIPASE, AMYLASE in the last 168 hours. No results for input(s): AMMONIA in the last 168 hours. Coagulation Profile: Recent Labs  Lab 08/12/19 1103  INR 1.0   Cardiac Enzymes: No results for input(s): CKTOTAL, CKMB, CKMBINDEX, TROPONINI in the last 168 hours. BNP (last 3 results) No results for input(s): PROBNP in the last 8760 hours. HbA1C: No results for input(s): HGBA1C in the last 72 hours. CBG: No results for input(s): GLUCAP in the last 168 hours. Lipid Profile: No results for input(s): CHOL, HDL, LDLCALC, TRIG, CHOLHDL, LDLDIRECT in the last 72 hours. Thyroid Function Tests: No results for input(s): TSH, T4TOTAL, FREET4, T3FREE, THYROIDAB in the last 72 hours. Anemia Panel: No results for input(s): VITAMINB12, FOLATE, FERRITIN, TIBC, IRON, RETICCTPCT in the last 72 hours. Urine analysis:    Component Value Date/Time   COLORURINE YELLOW 08/12/2019 1446   APPEARANCEUR CLOUDY (A) 08/12/2019 1446   LABSPEC 1.013 08/12/2019 1446   PHURINE 8.0 08/12/2019 1446   GLUCOSEU NEGATIVE 08/12/2019 1446   GLUCOSEU NEGATIVE 10/22/2013 1621   HGBUR  NEGATIVE 08/12/2019 1446   BILIRUBINUR NEGATIVE 08/12/2019 1446   KETONESUR NEGATIVE 08/12/2019 1446   PROTEINUR NEGATIVE 08/12/2019 1446   UROBILINOGEN 1.0 03/27/2015 1534   NITRITE NEGATIVE 08/12/2019 1446   LEUKOCYTESUR NEGATIVE 08/12/2019 1446    Radiological Exams on Admission: DG Chest 2 View  Result Date: 08/12/2019 CLINICAL DATA:  Cough. EXAM: CHEST - 2 VIEW COMPARISON:  08/14/2018 FINDINGS: Heart size is at the upper limits of normal and stable. Aortic atherosclerosis incidentally noted. Both lungs are clear. Surgical clips again seen in lower neck and superior mediastinum. IMPRESSION: No active cardiopulmonary disease. Electronically Signed   By: Marlaine Hind M.D.   On: 08/12/2019 14:19   MR BRAIN WO CONTRAST  Result Date: 08/12/2019 CLINICAL DATA:  Code stroke follow-up EXAM: MRI HEAD WITHOUT CONTRAST TECHNIQUE: Multiplanar, multiecho pulse sequences of the brain and surrounding structures were obtained without intravenous contrast. COMPARISON:  2015 FINDINGS: Motion artifact is present. Brain: There is no acute infarction or intracranial hemorrhage. There is no intracranial mass, mass effect, or edema. There is no hydrocephalus or extra-axial fluid collection. Chronic left MCA territory infarction is present involving the frontoparietal lobes, insula, and basal ganglia. There is ex vacuo dilatation of the left lateral ventricle. Chronic blood products are present in association with the basal ganglia portion. There are additional multiple chronic small cerebellar infarcts. Additional patchy T2 hyperintensity in the supratentorial white matter is nonspecific but probably reflects mild chronic microvascular ischemic changes. Vascular: Major vessel flow voids at the skull base are preserved. Skull and upper cervical spine: Normal marrow signal is preserved. Sinuses/Orbits: Paranasal sinuses are aerated. Orbits are unremarkable. Other: Sella is unremarkable.  Mastoid air cells are clear.  IMPRESSION: No acute infarction, hemorrhage, or mass. Chronic left MCA territory infarction.  Chronic cerebellar  infarcts. Electronically Signed   By: Macy Mis M.D.   On: 08/12/2019 12:46   CT HEAD CODE STROKE WO CONTRAST  Result Date: 08/12/2019 CLINICAL DATA:  Code stroke.  Right-sided weakness EXAM: CT HEAD WITHOUT CONTRAST TECHNIQUE: Contiguous axial images were obtained from the base of the skull through the vertex without intravenous contrast. COMPARISON:  2016 FINDINGS: Brain: There is no acute intracranial hemorrhage, mass effect, or edema. Chronic left MCA territory infarction is again identified primarily involving frontoparietal lobes, insula, and basal ganglia with ex vacuo dilatation of the adjacent left lateral ventricle. Multiple chronic cerebellar infarcts are also seen. There is no acute appearing loss of gray differentiation. Additional patchy and confluent hypoattenuation in the supratentorial white matter is nonspecific but probably reflects moderate chronic microvascular ischemic changes. There is no hydrocephalus. Vascular: No hyperdense vessel. Intracranial atherosclerotic calcification is present at the skull base. Skull: Unremarkable. Sinuses/Orbits: No acute abnormality. Other: Mastoid air cells are clear. ASPECTS (Coleman Stroke Program Early CT Score) - Ganglionic level infarction (caudate, lentiform nuclei, internal capsule, insula, M1-M3 cortex): 7 - Supraganglionic infarction (M4-M6 cortex): 3 Total score (0-10 with 10 being normal): 10 IMPRESSION: No acute intracranial hemorrhage or evidence of acute infarction. ASPECT score is 10 on the right. Chronic left MCA territory infarction. Chronic cerebellar infarcts. Moderate chronic microvascular ischemic changes. These results were communicated to Dr. Lorraine Lax at 11:16 amon 3/7/2021by text page via the [facility] messaging system. Electronically Signed   By: Macy Mis M.D.   On: 08/12/2019 11:20    EKG: A. fib, no ST elevation or  depression noted.  Assessment/Plan Active Problems:   Essential hypertension   Chronic diastolic congestive heart failure (HCC)   History of stroke with residual effects   Right sided weakness   Hypokalemia   Type 2 diabetes mellitus (HCC)   SLE (systemic lupus erythematosus related syndrome) ([place])   Hypothyroidism   HLD (hyperlipidemia)    Expressive aphasia with right-sided weakness: -Patient has history of left MCA with residual right-sided weakness.  Presented with worsening right-sided weakness and expressive aphasia.  -Risk factors including: Hypertension, hyperlipidemia, obesity, APL syndrome - CT head without contrast and MRI brain came back negative for acute infarction. -Admit patient on the floor.  On telemetry. -EDP consulted neurology-recommended EEG to rule out seizure-appreciate help -Unable to get CT angiogram of head and neck due to Hx of renal transplant? -Consulted PT/ST/OT.  - We will keep her n.p.o. until she passes a bedside swallow evaluation. -Monitor vitals closely. -CM-working on patient's rehab/SNF placement  Leukocytosis: Could be due to chronic steroid use -Patient is currently afebrile, UA is negative, chest x-ray negative for pneumonia. -No indication of antibiotics at this time.  Repeat CBC tomorrow a.m.  Hypothyroidism: Check TSH -Continue levothyroxine  ESRD due to lupus status post renal transplant: On chronic immunosuppressant.  We will continue same.  Kidney function at baseline.  Monitor closely  History of DVT/PE/APL syndrome:  -On Coumadin at home-cont. same -PT/INR: subtherapeutic-will bridge with heparin, & will cont. To monitor PT/INR.  Hypokalemia: Replenished -Repeat BMP tomorrow a.m.  Chronic diastolic congestive heart failure: Not in acute exacerbation -Patient is euvolemic chest x-ray is negative.  Continue Lasix -Strict INO's and daily weight.  Hyperlipidemia: Continue statin  Hypertension: Blood pressure elevated upon  arrival -MRI is negative for stroke -Continue home meds of losartan, diltiazem -Monitor blood pressure closely  Diabetes mellitus: Check A1c -Hold Metformin and started patient on sliding scale insulin  Anxiety/depression/bipolar: -Continue Zoloft, Seroquel, Remeron and  Aricept  Hyperlipidemia: Continue atorvastatin  DVT prophylaxis: On Coumadin, SCD code Status: Full code Family Communication: None present at bedside.  Plan of care discussed with patient in length and he verbalized understanding and agreed with it. Disposition Plan: To be determined Consults called: Neurology by EDP Admission status: Inpatient   Mckinley Jewel MD [facility] Pager [phone]  If 7PM-7AM, please contact night-coverage [email or web address] Password [facility]  08/12/2019, 6:33 PM

## 2019-08-12 NOTE — ED Notes (Signed)
Pt reports urinating. Missed purewick, pt's brief changed. Purewick repositioned and chux placed under patient. Pt will attempt to use bathroom again. Will continue to monitor.

## 2019-08-12 NOTE — Consult Note (Addendum)
Neurology Consultation  Reason for Consult: Code stroke Referring Physician:  Dr. Mitzi Hansen PA-C  CC: Right hemibody weakness  History is obtained from: EMS/chart review  HPI: Connie Ruiz is a 59 y.o. 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  Patient was brought via EMS for worsening of pre-existing R hemibody weakness and dysarthria that started at 0830 today. On assessment, patient has notable dysarthria and expressive aphasia along with R hemibody weakness and drift.   ED course  Relevant labs include - INR 1 (goal 2-3) CT head shows- no acute pathology  LKW: 0830 tpa given?: no  Past Medical History:  Diagnosis Date   Anxiety    Candida esophagitis ([place]) 11/12/2014   CEREBROVASCULAR ACCIDENT, ACUTE 04/15/2010   CLOSTRIDIUM DIFFICILE COLITIS, HX OF 08/21/2007   CONGESTIVE HEART FAILURE 08/21/2007   Current use of long term anticoagulation    Dr. Andree Elk, [facility]   CVA 04/17/2010   Depression    Dr. Andree Elk, [place], TYPE II 08/21/2007   DVT, HX OF 08/21/2007   GERD 08/21/2007   GOUT 08/21/2007   History of stroke with residual effects    HYPERLIPIDEMIA 08/21/2007   HYPERTENSION 08/21/2007   Dr. Andree Elk, [facility], HX OF 08/22/2007   s/p renal transplant-Dr. Bonney Leitz   LUPUS 08/21/2007   OSTEOPOROSIS 08/21/2007   Rheumatol at [facility]   Pulmonary embolism ([place]) 07/16/2010   Renal failure    RENAL INSUFFICIENCY 08/21/2007   Right sided weakness    Steroid-induced hyperglycemia 11/09/2014   Tachycardia    THYROID NODULE, LEFT 04/10/2009   4-Needs assistance to walk and tend to bodily needs Essential (primary) hypertension, Heart failure, unspecified, Type 2 diabetes mellitus with hyperglycemia  and ESRD    Family History  Problem Relation Age of Onset   Heart attack Mother    Heart disease Father    Asthma Sister    Asthma Daughter    Cancer Maternal Grandfather        prostate   Cancer Paternal Grandfather        colon   Social History:   reports that she has never smoked. She has never used smokeless tobacco. She reports that she does not drink alcohol or use drugs.  Medications  Current Facility-Administered Medications:    sodium chloride flush (NS) 0.9 % injection 3 mL, 3 mL, Intravenous, Once, Daleen Bo, MD  Current Outpatient Medications:    acetaminophen (TYLENOL) 500 MG tablet, Take 1,000 mg by mouth daily as needed for mild pain., Disp: , Rfl:    alendronate (FOSAMAX) 70 MG tablet, TAKE 1 TABLET BY MOUTH ONCE A WEEK ON AN EMPTY STOMACH ON SATURDAYS WITH A FULL GLASS OF WATER. (Patient taking differently: Take 70 mg by mouth once a week. ), Disp: 12 tablet, Rfl: 0   atorvastatin (LIPITOR) 40 MG tablet, Take 40 mg by mouth daily at 6 PM. , Disp: , Rfl:    Blood Glucose Monitoring Suppl (ACCU-CHEK AVIVA PLUS) w/Device KIT, 1 Device by Does not apply route daily., Disp: 1 kit, Rfl: 1   calcitRIOL (ROCALTROL) 0.25 MCG capsule, TAKE 1 CAPSULE(0.25 MCG) BY MOUTH DAILY. NO REFILLS WITHOUT APPOINTMENT, Disp: 30 capsule, Rfl: 0   diltiazem (TIAZAC) 360 MG 24 hr capsule, Take 1 capsule (360 mg total) by mouth daily., Disp: 90  capsule, Rfl: 1   donepezil (ARICEPT) 10 MG tablet, Take 10 mg by mouth at bedtime. , Disp: , Rfl:    esomeprazole (NEXIUM) 20 MG capsule, TAKE 1 CAPSULE(20 MG) BY MOUTH DAILY (Patient taking differently: Take 20 mg by mouth daily at 12 noon. ), Disp: 30 capsule, Rfl: 11   feeding supplement, ENSURE ENLIVE, (ENSURE ENLIVE) LIQD, Take 237 mLs by mouth 2 (two) times daily between meals., Disp: , Rfl:    glucose blood (ONE TOUCH ULTRA TEST) test strip, Use to check blood sugar 1 time per day, Disp: 100 each, Rfl: 2   levothyroxine (SYNTHROID) 175 MCG tablet,  TAKE 1 TABLET(175 MCG) BY MOUTH DAILY BEFORE BREAKFAST, Disp: 30 tablet, Rfl: 0   losartan (COZAAR) 100 MG tablet, Take 1 tablet (100 mg total) by mouth daily., Disp: 90 tablet, Rfl: 1   magnesium oxide (MAG-OX) 400 MG tablet, Take 800 mg by mouth daily. , Disp: , Rfl:    metFORMIN (GLUCOPHAGE-XR) 500 MG 24 hr tablet, TAKE 1 TABLET(500 MG) BY MOUTH DAILY WITH BREAKFAST, Disp: 90 tablet, Rfl: 0   mirtazapine (REMERON) 30 MG tablet, Take 1 tablet (30 mg total) by mouth at bedtime., Disp: 30 tablet, Rfl: 1   mycophenolate (CELLCEPT) 250 MG capsule, TAKE ONE CAPSULE BY MOUTH TWICE DAILY, Disp: , Rfl:    nystatin (MYCOSTATIN) powder, Apply 1 g topically 4 (four) times daily as needed. For yeast under breast, Disp: , Rfl:    ondansetron (ZOFRAN ODT) 4 MG disintegrating tablet, 34m ODT q4 hours prn nausea/vomiting, Disp: 15 tablet, Rfl: 0   ONETOUCH DELICA LANCETS 321VMISC, Use to check blood sugar 1 time per day, Disp: 100 each, Rfl: 2   oxybutynin (DITROPAN) 5 MG tablet, Take 5 mg by mouth daily as needed for bladder spasms. , Disp: , Rfl:    Potassium Chloride ER 20 MEQ TBCR, Take 1 tablet by mouth daily. NEEDS POTASSIUM RE-CHECK., Disp: 90 tablet, Rfl: 1   predniSONE (DELTASONE) 5 MG tablet, Take 5 mg by mouth daily with breakfast. , Disp: , Rfl:    QUEtiapine (SEROQUEL) 25 MG tablet, Take 1 tablet (25 mg total) by mouth at bedtime., Disp: 30 tablet, Rfl: 1   sertraline (ZOLOFT) 100 MG tablet, Take 0.5 tablets (50 mg total) by mouth daily., Disp: 15 tablet, Rfl: 2   sucralfate (CARAFATE) 1 g tablet, Take 1 tablet (1 g total) by mouth 4 (four) times daily for 30 days., Disp: , Rfl:    tacrolimus (PROGRAF) 1 MG capsule, Take 1-2 mg by mouth See admin instructions. 2 mg in the am and 1 mg at night, Disp: , Rfl:    warfarin (COUMADIN) 5 MG tablet, TAKE 1/2 TO 1 TABLET BY MOUTH ON MONDAY THROUGH FRIDAY; TUESDAY, WEDNESDAY, THURSDAY, SATAND SUNDAY-2.5MG, Disp: 50 tablet, Rfl: 0  ROS:  Unable to obtain due  to altered mental status.   Exam: Current vital signs: BP (!) 145/107   Pulse 93   Temp 98.5 F (36.9 C) (Oral)   Resp 18   Ht '5\' 4"'  (1.626 m)   Wt 83.1 kg   SpO2 100%   BMI 31.45 kg/m  Vital signs in last 24 hours: Temp:  [98.5 F (36.9 C)] 98.5 F (36.9 C) (03/07 1143) Pulse Rate:  [89-100] 93 (03/07 1200) Resp:  [16-18] 18 (03/07 1200) BP: (136-145)/(96-107) 145/107 (03/07 1200) SpO2:  [100 %] 100 % (03/07 1200) Weight:  [83.1 kg] 83.1 kg (03/07 1146)  Constitutional: Appears well-developed and well-nourished.  Psych: Affect appropriate to situation Eyes: No scleral injection HENT: No OP obstrucion Head: Normocephalic.  Cardiovascular: Normal rate and regular rhythm.  Respiratory: Effort normal, non-labored breathing GI: Soft.  No distension. There is no tenderness.  Skin: WDI  Neuro: Mental Status: Patient is awake, alert, oriented to person, place, and situation. Patient is unable to give a clear and coherent history d/t underlying dysarthria/aphasia- however does draw out whatever she wants to say with her finger.Some stuttering of her speech is  seen  Cranial Nerves: intact Motor: RUE 4/5 RLE 3/5 Sensory: RLE decreased sensation Deep Tendon Reflexes: Decreased reflexes on RHB Plantars: Toes are downgoing bilaterally.  Cerebellar: R sided ataxia and RUE pronator drift  NIHSS 7  Labs I have reviewed labs in epic and the results pertinent to this consultation are:  CBC    Component Value Date/Time   WBC 11.2 (H) 08/12/2019 1103   RBC 4.78 08/12/2019 1103   HGB 15.3 (H) 08/12/2019 1106   HCT 45.0 08/12/2019 1106   PLT 213 08/12/2019 1103   MCV 93.9 08/12/2019 1103   MCH 30.1 08/12/2019 1103   MCHC 32.1 08/12/2019 1103   RDW 15.7 (H) 08/12/2019 1103   LYMPHSABS 4.3 (H) 08/12/2019 1103   MONOABS 0.7 08/12/2019 1103   EOSABS 0.0 08/12/2019 1103   BASOSABS 0.1 08/12/2019 1103    CMP     Component Value Date/Time   NA 139 08/12/2019 1106   K  3.4 (L) 08/12/2019 1106   CL 106 08/12/2019 1106   CO2 22 08/12/2019 1103   GLUCOSE 141 (H) 08/12/2019 1106   BUN 13 08/12/2019 1106   CREATININE 0.60 08/12/2019 1106   CALCIUM 9.6 08/12/2019 1103   CALCIUM 8.7 01/08/2011 1729   PROT 7.3 08/12/2019 1103   ALBUMIN 3.9 08/12/2019 1103   AST 18 08/12/2019 1103   ALT 14 08/12/2019 1103   ALKPHOS 72 08/12/2019 1103   BILITOT 0.8 08/12/2019 1103   GFRNONAA >60 08/12/2019 1103   GFRAA >60 08/12/2019 1103    Lipid Panel     Component Value Date/Time   CHOL 166 06/27/2017 1143   TRIG 0.0 06/27/2017 1143   HDL 81.80 06/27/2017 1143   CHOLHDL 2 06/27/2017 1143   VLDL 0.0 06/27/2017 1143   LDLCALC 84 06/27/2017 1143     Imaging I have reviewed the images obtained: CT-scan of the brain - No acute intracranial hemorrhage or evidence of acute infarction. - ASPECT score is 10 on the right. Chronic left MCA territory infarction. - Chronic cerebellar infarcts. - Moderate chronic microvascular ischemic changes.  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 Assessment significant for RHB weakness, decreased reflexes, RUE drift, RLE decreased sensation. CT neg for acute processes. MRI neg for acute stroke.  Impression: - Worsened R hemibody weakness and dysarthria- likely recrudescence of old stroke symptoms - Mild leukocytosis   Recommendations: - MRI neg for acute stroke, cancel code stroke - Infectious work up pending - Will continue to monitor   NEUROHOSPITALIST ADDENDUM Performed a face to face diagnostic evaluation.   I have reviewed the contents of history and physical exam as documented  by PA/ARNP/Resident and agree with above documentation.  I have discussed and formulated the above plan as  documented. Edits to the note have been made as needed.  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 NIHSS 7. CT head redomonstrates left hemispheric encephalomalacia.  Felt this was recrudescence of prior stroke symptoms:  obtained stat MRI negative for acute stroke.      Karena Addison Avyn Coate MD [facility] [phone]   If 7pm to 7am, please call on call as listed on AMION.

## 2019-08-12 NOTE — Progress Notes (Signed)
Interim Note  - MRI neg for acute stroke - Cancel code stroke - suspect recrudescence of old stroke symptoms- mild leukocytosis. - f/u infectious work up    Full consult note pending

## 2019-08-12 NOTE — TOC Initial Note (Signed)
Transition of Care [facility]) - Initial/Assessment Note    Patient Details  Name: Connie Ruiz MRN: [id number] Date of Birth: [birth year]  [facility]) CM/SW Contact:    Oretha Milch, LCSW Phone Number: 08/12/2019, 4:48 PM  Clinical Narrative: CSW received consult request for SNF vs. [facility] placement for patient pending physical therapy evaluation. CSW notes patient has aphasia and has significant difficulty communicating. Patient lives alone with home health sitter although does have family contacts listed in [place]. CSW spoke with PA and at this time CSW is anticipating recommendation for SNF placement over home health. CSW has began the SNF referral process pending a physical therapy evaluation. CSW was informed hospitalist is reviewing for possible admission due to declining neurological state. CSW will continue to follow regardless of admission or not to proceed with appropriate referrals pending PT recommendation.                  Expected Discharge Plan: Skilled Nursing Facility Barriers to Discharge: Ship broker, Continued Medical Work up   Patient Goals and CMS Choice        Expected Discharge Plan and Services Expected Discharge Plan: [place]                           DME Agency: NA                  Prior Living Arrangements/Services   Lives with:: Self Patient language and need for interpreter reviewed:: Yes Do you feel safe going back to the place where you live?: No      Need for Family Participation in Patient Care: No (Comment) Care giver support system in place?: Yes (comment) Current home services: Homehealth aide Criminal Activity/Legal Involvement Pertinent to Current Situation/Hospitalization: No - Comment as needed  Activities of Daily Living      Permission Sought/Granted                  Emotional Assessment Appearance:: Appears older than stated age Attitude/Demeanor/Rapport: Unable to  Assess Affect (typically observed): Unable to Assess Orientation: : Oriented to Self, Oriented to Place, Oriented to  Time, Oriented to Situation Alcohol / Substance Use: Not Applicable Psych Involvement: No (comment)  Admission diagnosis:  Code Stroke Patient Active Problem List   Diagnosis Date Noted  . 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 Hypothyroidism 01/14/2018  . HLD (hyperlipidemia) 01/14/2018  . CVA (cerebral vascular accident) ([place]) 01/12/2018  . Chest pain 01/11/2018  . Acute respiratory failure with hypoxia ([place]) 07/20/2017  . Abdominal bloating 07/20/2017  . Sinus tachycardia 07/20/2017  . SLE (systemic lupus erythematosus related syndrome) ([place]) 07/20/2017  . CAP (community acquired pneumonia) 07/16/2017  . Long term (current) use of anticoagulants [Z79.01] 06/21/2017  . Screening examination for infectious disease 01/28/2016  . Type 2 diabetes mellitus ([place]) 08/03/2015  . Urinary tract infection 07/20/2015  . Vascular dementia ([place]) 05/08/2015  . Hemiparesis as late effect of cerebrovascular accident (CVA) ([place]) 05/08/2015  . Aphasia as late effect of stroke 05/08/2015  . C. difficile colitis 02/25/2015  . Hypokalemia 02/25/2015  . CKD (chronic kidney disease) 02/25/2015  . Colitis 02/14/2015  . Sepsis ([place]) 02/14/2015  . Nausea vomiting and diarrhea 02/14/2015  . Abdominal pain 02/14/2015  . UTI (lower urinary tract infection) 02/14/2015  . Abdominal pain, chronic, generalized 01/07/2015  . Candida esophagitis ([place]) 11/12/2014  . Abnormal CT of the abdomen   . Steroid-induced hyperglycemia 11/09/2014  .  Hypomagnesemia 11/06/2014  . Abdominal pain, acute   . Supratherapeutic INR 11/02/2014  . Jejunitis 11/02/2014  . Myalgia and myositis 04/05/2014  . Wellness examination 04/05/2014  . Menopausal state 04/05/2014  . Palpitations 08/31/2013  . Hyperlipidemia 08/31/2013  . Disorder of heart rhythm 08/13/2013  . TIA (transient ischemic attack)  06/20/2013  . Gout flare: R elbow and R shoulder 06/20/2013  . Acute encephalopathy 06/14/2013  . Rhabdomyolysis 06/14/2013  . History of stroke with residual effects   . Right sided weakness   . Breast mass, left 04/30/2013  . Contusion of knee, left 10/18/2012  . Depression 10/05/2012  . Encounter for long-term (current) use of other medications 10/04/2012  . Multiple thyroid nodules 06/08/2012  . Risk for falls 05/05/2012  . Physical deconditioning 05/05/2012  . Fever 04/27/2012  . DVT (deep venous thrombosis) ([place]) 06/17/2011  . Expressive aphasia 06/17/2011  . Dysphasia 06/11/2011  . Incontinence of urine 06/11/2011  . Hand pain, left 06/11/2011  . Immunosuppressive management encounter following kidney transplant 2011/06/12  . Hypertension goal BP (blood pressure) < 130/80 06/12/11  . Deceased-donor kidney transplant recipient June 12, 2011  . Arm pain, left 05/07/2011  . Dysfunctional voiding of urine 04/28/2011  . Urge incontinence 04/28/2011  . Urinary urgency 04/28/2011  . Routine general medical examination at a health care facility 04/13/2011  . Anticoagulated 01/08/2011  . Pulmonary embolism ([place]) 07/16/2010  . Cerebral artery occlusion with cerebral infarction ([place]) 04/17/2010  . CEREBROVASCULAR ACCIDENT, ACUTE 04/15/2010  . THYROID NODULE, LEFT 04/10/2009  . Cough 03/26/2009  . ACUTE BRONCHITIS 08/22/2007  . KIDNEY TRANSPLANTATION, HX OF 08/22/2007  . Gout 08/21/2007  . Essential hypertension 08/21/2007  . Chronic diastolic congestive heart failure ([place]) 08/21/2007  . GERD 08/21/2007  . Disorder resulting from impaired renal function 08/21/2007  . LUPUS 08/21/2007  . Osteoporosis 08/21/2007  . DVT, HX OF 08/21/2007  . Enteritis due to Clostridium difficile 08/21/2007   PCP:  [name], [name], MD Pharmacy:   [facility] [place], [address] [place]  [address] Phone: [phone] Fax: [phone]  [place], [place] - [address] [place] [address] Phone: [phone] Fax: [phone]     Social Determinants of Health ([place]) Interventions    Readmission Risk Interventions No flowsheet data found.

## 2019-08-12 NOTE — ED Notes (Signed)
Updated pt's daughter over pt's phone. All questions answered

## 2019-08-12 NOTE — ED Notes (Signed)
Attempted to get pt up to ambulate with walker with tech. Pt unable to sit on side of bed without 2 person assist. Too weak to stand up. PA informed

## 2019-08-12 NOTE — ED Notes (Signed)
Pt transported to MRI 

## 2019-08-12 NOTE — ED Provider Notes (Signed)
  Face-to-face evaluation   History: Patient presents for worsening trouble talking, and more weakness in the right arm and leg, since early this morning.  She reports having pain and weakness in her right arm and leg for 4 days.  She denies trauma.  She lives alone.  She has a history of left brain stroke.  She states she is left-handed.  Physical exam: Alert and communicative.  She has aphasia.  Right arm is weak, 2/5, as compared to normal strength in the left.  Right leg is weak, 1/5 as compared to 4/5 in the left.  Medical screening examination/treatment/procedure(s) were conducted as a shared visit with non-physician practitioner(s) and myself.  I personally evaluated the patient during the encounter    Daleen Bo, MD 08/12/19 2020

## 2019-08-13 ENCOUNTER — Inpatient Hospital Stay (HOSPITAL_COMMUNITY): Payer: Medicare Other

## 2019-08-13 ENCOUNTER — Other Ambulatory Visit: Payer: Self-pay | Admitting: *Deleted

## 2019-08-13 DIAGNOSIS — R4701 Aphasia: Secondary | ICD-10-CM

## 2019-08-13 LAB — LIPID PANEL
Cholesterol: 305 mg/dL — ABNORMAL HIGH (ref 0–200)
HDL: 89 mg/dL (ref >40)
LDL Cholesterol: 202 mg/dL — ABNORMAL HIGH (ref 0–99)
Total CHOL/HDL Ratio: 3.4 RATIO
Triglycerides: 70 mg/dL (ref <150)
VLDL: 14 mg/dL (ref 0–40)

## 2019-08-13 LAB — GLUCOSE, CAPILLARY
Glucose-Capillary: 99 mg/dL (ref 70–99)
Glucose-Capillary: 99 mg/dL (ref 70–99)
Glucose-Capillary: 120 mg/dL — ABNORMAL HIGH (ref 70–99)
Glucose-Capillary: 115 mg/dL — ABNORMAL HIGH (ref 70–99)
Glucose-Capillary: 170 mg/dL — ABNORMAL HIGH (ref 70–99)

## 2019-08-13 LAB — CBC
HCT: 38.7 % (ref 36.0–46.0)
Hemoglobin: 12.7 g/dL (ref 12.0–15.0)
MCH: 30.1 pg (ref 26.0–34.0)
MCHC: 32.8 g/dL (ref 30.0–36.0)
MCV: 91.7 fL (ref 80.0–100.0)
Platelets: 205 10*3/uL (ref 150–400)
RBC: 4.22 MIL/uL (ref 3.87–5.11)
RDW: 15.4 % (ref 11.5–15.5)
WBC: 8.4 10*3/uL (ref 4.0–10.5)
nRBC: 0 % (ref 0.0–0.2)

## 2019-08-13 LAB — TSH: TSH: 121.857 u[IU]/mL — ABNORMAL HIGH (ref 0.350–4.500)

## 2019-08-13 LAB — PROTIME-INR
INR: 1.3 — ABNORMAL HIGH (ref 0.8–1.2)
Prothrombin Time: 15.7 seconds — ABNORMAL HIGH (ref 11.4–15.2)

## 2019-08-13 LAB — HEPARIN LEVEL (UNFRACTIONATED): Heparin Unfractionated: 1.74 IU/mL — ABNORMAL HIGH (ref 0.30–0.70)

## 2019-08-13 MED ORDER — ORAL CARE MOUTH RINSE
15.0000 mL | Freq: Two times a day (BID) | OROMUCOSAL | Status: DC
  Administered 2019-08-13 – 2019-08-15 (×3): 15 mL via OROMUCOSAL

## 2019-08-13 MED ORDER — ENOXAPARIN SODIUM 80 MG/0.8ML ~~LOC~~ SOLN
80.0000 mg | Freq: Two times a day (BID) | SUBCUTANEOUS | Status: DC
  Administered 2019-08-13 – 2019-08-15 (×5): 80 mg via SUBCUTANEOUS
  Filled 2019-08-13 (×5): qty 0.8

## 2019-08-13 MED ORDER — TACROLIMUS 1 MG PO CAPS
2.0000 mg | ORAL_CAPSULE | Freq: Two times a day (BID) | ORAL | Status: DC
  Administered 2019-08-13 – 2019-08-15 (×3): 2 mg via ORAL
  Filled 2019-08-13 (×5): qty 2

## 2019-08-13 MED ORDER — LEVOTHYROXINE SODIUM 100 MCG PO TABS
200.0000 ug | ORAL_TABLET | Freq: Every day | ORAL | Status: DC
  Administered 2019-08-14 – 2019-08-15 (×2): 200 ug via ORAL
  Filled 2019-08-13 (×2): qty 2

## 2019-08-13 MED ORDER — ACETAMINOPHEN 325 MG PO TABS
650.0000 mg | ORAL_TABLET | Freq: Four times a day (QID) | ORAL | Status: DC | PRN
  Administered 2019-08-13 – 2019-08-15 (×3): 650 mg via ORAL
  Filled 2019-08-13 (×3): qty 2

## 2019-08-13 NOTE — Patient Outreach (Signed)
[facility]) Care Management  08/13/2019  ALEERA GILCREASE November 22, 1960 [id number]   RN Health Coach Hospitalization  Referral Date: 11/28/2018 Referral Source: [facility] High Risk Screening Reason for Referral: Disease Management Education Insurance: [employer]   Outreach Attempt:  Received notification patient admitted to [facility] for increased weakness and trouble speaking.  RN Health Coach will notify [facility] Liaison of patients admission.  Plan:  RN Health Coach will await [facility] Liaisons recommendations for discharge follow up.  [place] [phone] [email or web address]

## 2019-08-13 NOTE — Progress Notes (Signed)
PROGRESS NOTE    Connie Ruiz  YFV:[id number] DOB: 05-Jun-1961 DOA: 08/12/2019 PCP: [name], [name], MD     Brief Narrative:  59 year old woman admitted from home on 3/7 with complaints of slurred speech and worsening right-sided weakness.  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  Patient has a history of an old left MCA stroke with residual right-sided weakness.  Day of admission at around 830 she had increased right-sided weakness and slurred speech.  Admission was requested for further evaluation and management.   Assessment & Plan:   Active Problems:   Essential hypertension   Chronic diastolic congestive heart failure (HCC)   History of stroke with residual effects   Right sided weakness   Hypokalemia   Type 2 diabetes mellitus (HCC)   SLE (systemic lupus erythematosus related syndrome) (HCC)   Hypothyroidism   HLD (hyperlipidemia)   Right-sided weakness and slurred speech -MRI was negative for acute infarction, mass or hemorrhage. -She was initially admitted as a code stroke, after negative MRI and evaluation by neurology it was canceled.  Neurology is suspecting recrudescence of old stroke symptoms. -EEG without signs of acute seizure activity but indicative of cortical dysfunction of the left frontotemporal region likely secondary to prior infarct. -No further work-up is indicated by neurology team. -We are awaiting PT/OT consultations. -Patient does not believe she can go back home and will likely need SNF.  TOC team is involved. -She remains on Coumadin, however because she is subtherapeutic is also on therapeutic Lovenox dosing.  History of DVT/PE -On Coumadin with Lovenox  bridge  Leukocytosis -Likely due to chronic steroid use, she remains afebrile, chest x-ray and UA are negative for sources of infection. -She is not currently on antibiotic therapy. -Leukocytosis has resolved.  Hypothyroidism -TSH is extremely elevated to 121.8 -Increase Synthroid dose from 175 to 200 mcg. -She will need repeat thyroid function tests in 4 to 6 weeks.  End-stage renal disease status post renal transplant -On chronic immunosuppressive therapy. -Renal function remains at baseline.  Chronic diastolic heart failure -Appears compensated and euvolemic.  Hyperlipidemia -Continue statin  Hypertension -Continue home medications -Blood pressure remains slightly above goal, do not anticipate medication changes while hospitalized.  Type 2 diabetes -Holding Metformin, continue SSI. -A1c is 7.3.  Anxiety, depression, bipolar disorder -Continue Zoloft, Seroquel, Remeron, Aricept     DVT prophylaxis: She is on therapeutic Lovenox dosing Code Status: Full code Family Communication: Patient only Disposition Plan: Anticipate discharge to SNF pending therapy consultations  Consultants:   Neurology  Procedures:   None  Antimicrobials:  Anti-infectives (From admission, onward)   None       Subjective: Lying in bed, denies chest pain or shortness of breath, her speech appears altered.  Objective: Vitals:   08/13/19 0030 08/13/19 0130 08/13/19 0330 08/13/19 0837  BP: (!) 158/98 (!) 145/100 (!) 148/93 (!) 150/88  Pulse: 96 92 93 93  Resp:  10 16 12   Temp: 98 F (36.7 C) 98 F (36.7 C) 98.4 F (36.9 C) 98.6 F (37 C)  TempSrc: Oral Oral Oral Oral  SpO2: 100% 100% 100% 99%  Weight:      Height:        Intake/Output Summary (Last 24 hours) at 08/13/2019 1425 Last data filed at 08/13/2019 1222 Gross  per 24 hour  Intake 55.39 ml  Output 875 ml  Net -819.61 ml   Filed Weights   08/12/19 1146  Weight: 83.1 kg    Examination:  General exam: Alert,  awake, oriented x 3, obese Respiratory system: Clear to auscultation. Respiratory effort normal. Cardiovascular system:RRR. No murmurs, rubs, gallops. Gastrointestinal system: Abdomen is nondistended, soft and nontender. No organomegaly or masses felt. Normal bowel sounds heard. Central nervous system: Alert and oriented. Speech is slurred. Extremities: No C/C/E, +pedal pulses Skin: No rashes, lesions or ulcers Psychiatry: Judgement and insight appear normal. Mood & affect appropriate.     Data Reviewed: I have personally reviewed following labs and imaging studies  CBC: Recent Labs  Lab 08/12/19 1103 08/12/19 1106 08/13/19 0607  WBC 11.2*  --  8.4  NEUTROABS 6.1  --   --   HGB 14.4 15.3* 12.7  HCT 44.9 45.0 38.7  MCV 93.9  --  91.7  PLT 213  --  751   Basic Metabolic Panel: Recent Labs  Lab 08/12/19 1103 08/12/19 1106  NA 142 139  K 3.4* 3.4*  CL 107 106  CO2 22  --   GLUCOSE 146* 141*  BUN 12 13  CREATININE 0.86 0.60  CALCIUM 9.6  --    GFR: Estimated Creatinine Clearance: 80 mL/min (by C-G formula based on SCr of 0.6 mg/dL). Liver Function Tests: Recent Labs  Lab 08/12/19 1103  AST 18  ALT 14  ALKPHOS 72  BILITOT 0.8  PROT 7.3  ALBUMIN 3.9   No results for input(s): LIPASE, AMYLASE in the last 168 hours. No results for input(s): AMMONIA in the last 168 hours. Coagulation Profile: Recent Labs  Lab 08/12/19 1103 08/13/19 0607  INR 1.0 1.3*   Cardiac Enzymes: No results for input(s): CKTOTAL, CKMB, CKMBINDEX, TROPONINI in the last 168 hours. BNP (last 3 results) No results for input(s): PROBNP in the last 8760 hours. HbA1C: Recent Labs    08/12/19 1103  HGBA1C 7.3*   CBG: Recent Labs  Lab 08/12/19 2236 08/13/19 0612 08/13/19 0840 08/13/19 1226  GLUCAP 115* 99 99 120*   Lipid Profile: Recent Labs    08/13/19 0607  CHOL 305*  HDL 89  LDLCALC 202*  TRIG 70  CHOLHDL 3.4   Thyroid Function Tests: Recent Labs    08/13/19 0607   TSH 121.857*   Anemia Panel: No results for input(s): VITAMINB12, FOLATE, FERRITIN, TIBC, IRON, RETICCTPCT in the last 72 hours. Urine analysis:    Component Value Date/Time   COLORURINE YELLOW 08/12/2019 1446   APPEARANCEUR CLOUDY (A) 08/12/2019 1446   LABSPEC 1.013 08/12/2019 1446   PHURINE 8.0 08/12/2019 1446   GLUCOSEU NEGATIVE 08/12/2019 1446   GLUCOSEU NEGATIVE 10/22/2013 1621   HGBUR NEGATIVE 08/12/2019 1446   BILIRUBINUR NEGATIVE 08/12/2019 1446   KETONESUR NEGATIVE 08/12/2019 1446   PROTEINUR NEGATIVE 08/12/2019 1446   UROBILINOGEN 1.0 03/27/2015 1534   NITRITE NEGATIVE 08/12/2019 1446   LEUKOCYTESUR NEGATIVE 08/12/2019 1446   Sepsis Labs: @LABRCNTIP (procalcitonin:4,lacticidven:4)  ) Recent Results (from the past 240 hour(s))  SARS CORONAVIRUS 2 (TAT 6-24 HRS) Nasopharyngeal Nasopharyngeal Swab     Status: None   Collection Time: 08/12/19  4:48 PM   Specimen: Nasopharyngeal Swab  Result Value Ref Range Status   SARS Coronavirus 2 NEGATIVE NEGATIVE Final    Comment: (NOTE) SARS-CoV-2 target nucleic acids are NOT DETECTED. The SARS-CoV-2 RNA is generally detectable in upper and lower respiratory specimens during the acute phase of infection. Negative results do  not preclude SARS-CoV-2 infection, do not rule out co-infections with other pathogens, and should not be used as the sole basis for treatment or other patient management decisions. Negative results must be combined with clinical observations, patient history, and epidemiological information. The expected result is Negative. Fact Sheet for Patients: SugarRoll.be Fact Sheet for Healthcare Providers: https://www.woods-mathews.com/ This test is not yet approved or cleared by the [place] FDA and  has been authorized for detection and/or diagnosis of SARS-CoV-2 by FDA under an Emergency Use Authorization (EUA). This EUA will remain  in effect (meaning this test  can be used) for the duration of the COVID-19 declaration under Section 56 4(b)(1) of the Act, 21 U.S.C. section 360bbb-3(b)(1), unless the authorization is terminated or revoked sooner. Performed at [facility], [place] [address] [place], [place] [address]          Radiology Studies: EEG  Result Date: 08/13/2019 Lora Havens, MD     08/13/2019 10:43 AM Patient Name: Connie Ruiz MRN: [id number] Epilepsy Attending: Lora Havens Referring Physician/Provider: Dr. Karena Addison Aroor Date: 08/13/2019 Duration: 24.29 minutes Patient history: 59 year old female with history of left MCA stroke who presented with worsening right-sided weakness and speech disturbance.  EEG to evaluate for seizures. Level of alertness: Awake AEDs during EEG study: None Technical aspects: This EEG study was done with scalp electrodes positioned according to the 10-20 International system of electrode placement. Electrical activity was acquired at a sampling rate of 500Hz  and reviewed with a high frequency filter of 70Hz  and a low frequency filter of 1Hz . EEG data were recorded continuously and digitally stored. Description: During awake state, the posterior dominant rhythm consists of 8 Hz activity of moderate voltage (25-35 uV) seen predominantly in posterior head regions, symmetric and reactive to eye opening and eye closing.  EEG showed continuous 5 to 7 Hz theta slowing in left centro- temporal region.  Sharp transients were seen in left frontotemporal region.  Physiologic photic driving was seen during photic stimulation.  Hyperventilation was not performed. Abnormality -Continuous slow, left centro- temporal region IMPRESSION: This study is suggestive of cortical dysfunction in the left frontotemporal region likely secondary to underlying infarct. No seizures or definite epileptiform discharges were seen throughout the recording. Lora Havens   DG Chest 2 View  Result Date: 08/12/2019 CLINICAL DATA:  Cough.  EXAM: CHEST - 2 VIEW COMPARISON:  08/14/2018 FINDINGS: Heart size is at the upper limits of normal and stable. Aortic atherosclerosis incidentally noted. Both lungs are clear. Surgical clips again seen in lower neck and superior mediastinum. IMPRESSION: No active cardiopulmonary disease. Electronically Signed   By: Marlaine Hind M.D.   On: 08/12/2019 14:19   MR BRAIN WO CONTRAST  Result Date: 08/12/2019 CLINICAL DATA:  Code stroke follow-up EXAM: MRI HEAD WITHOUT CONTRAST TECHNIQUE: Multiplanar, multiecho pulse sequences of the brain and surrounding structures were obtained without intravenous contrast. COMPARISON:  2015 FINDINGS: Motion artifact is present. Brain: There is no acute infarction or intracranial hemorrhage. There is no intracranial mass, mass effect, or edema. There is no hydrocephalus or extra-axial fluid collection. Chronic left MCA territory infarction is present involving the frontoparietal lobes, insula, and basal ganglia. There is ex vacuo dilatation of the left lateral ventricle. Chronic blood products are present in association with the basal ganglia portion. There are additional multiple chronic small cerebellar infarcts. Additional patchy T2 hyperintensity in the supratentorial white matter is nonspecific but probably reflects mild chronic microvascular ischemic changes. Vascular: Major vessel flow  voids at the skull base are preserved. Skull and upper cervical spine: Normal marrow signal is preserved. Sinuses/Orbits: Paranasal sinuses are aerated. Orbits are unremarkable. Other: Sella is unremarkable.  Mastoid air cells are clear. IMPRESSION: No acute infarction, hemorrhage, or mass. Chronic left MCA territory infarction.  Chronic cerebellar infarcts. Electronically Signed   By: Macy Mis M.D.   On: 08/12/2019 12:46   CT HEAD CODE STROKE WO CONTRAST  Result Date: 08/12/2019 CLINICAL DATA:  Code stroke.  Right-sided weakness EXAM: CT HEAD WITHOUT CONTRAST TECHNIQUE: Contiguous axial  images were obtained from the base of the skull through the vertex without intravenous contrast. COMPARISON:  2016 FINDINGS: Brain: There is no acute intracranial hemorrhage, mass effect, or edema. Chronic left MCA territory infarction is again identified primarily involving frontoparietal lobes, insula, and basal ganglia with ex vacuo dilatation of the adjacent left lateral ventricle. Multiple chronic cerebellar infarcts are also seen. There is no acute appearing loss of gray differentiation. Additional patchy and confluent hypoattenuation in the supratentorial white matter is nonspecific but probably reflects moderate chronic microvascular ischemic changes. There is no hydrocephalus. Vascular: No hyperdense vessel. Intracranial atherosclerotic calcification is present at the skull base. Skull: Unremarkable. Sinuses/Orbits: No acute abnormality. Other: Mastoid air cells are clear. ASPECTS ([place] Stroke Program Early CT Score) - Ganglionic level infarction (caudate, lentiform nuclei, internal capsule, insula, M1-M3 cortex): 7 - Supraganglionic infarction (M4-M6 cortex): 3 Total score (0-10 with 10 being normal): 10 IMPRESSION: No acute intracranial hemorrhage or evidence of acute infarction. ASPECT score is 10 on the right. Chronic left MCA territory infarction. Chronic cerebellar infarcts. Moderate chronic microvascular ischemic changes. These results were communicated to Dr. Lorraine Lax at 11:16 amon 3/7/2021by text page via the [facility] messaging system. Electronically Signed   By: Macy Mis M.D.   On: 08/12/2019 11:20        Scheduled Meds: . atorvastatin  40 mg Oral q1800  . diltiazem  360 mg Oral Daily  . donepezil  10 mg Oral QHS  . enoxaparin (LOVENOX) injection  80 mg Subcutaneous Q12H  . furosemide  20 mg Oral Daily  . insulin aspart  0-15 Units Subcutaneous TID WC  . insulin aspart  0-5 Units Subcutaneous QHS  . levothyroxine  175 mcg Oral QAC breakfast  . losartan  100 mg Oral Daily  .  mouth rinse  15 mL Mouth Rinse BID  . mirtazapine  30 mg Oral QHS  . mycophenolate  250 mg Oral BID  . pantoprazole  40 mg Oral Daily  . predniSONE  5 mg Oral Q breakfast  . QUEtiapine  25 mg Oral QHS  . sertraline  50 mg Oral Daily  . tacrolimus  1 mg Oral QHS  . tacrolimus  2 mg Oral Daily  . Warfarin - Pharmacist Dosing Inpatient   Does not apply q1800   Continuous Infusions:   LOS: 1 day    Time spent: 35 minutes. Greater than 50% of this time was spent in direct contact with the patient, coordinating care and discussing relevant ongoing clinical issues.     Lelon Frohlich, MD [facility] Pager [phone]  If 7PM-7AM, please contact night-coverage [email or web address] Password [email or web address] 08/13/2019, 2:25 PM

## 2019-08-13 NOTE — Procedures (Signed)
Patient Name: Connie Ruiz  MRN: [id number]  Epilepsy Attending: Lora Havens  Referring Physician/Provider: Dr. Karena Addison Aroor Date: 08/13/2019 Duration: 24.29 minutes  Patient history: 59 year old female with history of left MCA stroke who presented with worsening right-sided weakness and speech disturbance.  EEG to evaluate for seizures.  Level of alertness: Awake  AEDs during EEG study: None  Technical aspects: This EEG study was done with scalp electrodes positioned according to the 10-20 International system of electrode placement. Electrical activity was acquired at a sampling rate of 500Hz  and reviewed with a high frequency filter of 70Hz  and a low frequency filter of 1Hz . EEG data were recorded continuously and digitally stored.   Description: During awake state, the posterior dominant rhythm consists of 8 Hz activity of moderate voltage (25-35 uV) seen predominantly in posterior head regions, symmetric and reactive to eye opening and eye closing.  EEG showed continuous 5 to 7 Hz theta slowing in left centro- temporal region.  Sharp transients were seen in left frontotemporal region.  Physiologic photic driving was seen during photic stimulation.  Hyperventilation was not performed.  Abnormality -Continuous slow, left centro- temporal region  IMPRESSION: This study is suggestive of cortical dysfunction in the left frontotemporal region likely secondary to underlying infarct. No seizures or definite epileptiform discharges were seen throughout the recording.     Dejha King Barbra Sarks

## 2019-08-13 NOTE — Evaluation (Signed)
Speech Language Pathology Evaluation Patient Details Name: Connie Ruiz MRN: [id number] DOB: [birth year] Today's Date: 08/13/2019 Time: 5631-4970 SLP Time Calculation (min) (ACUTE ONLY): 17 min  Problem List:  Patient Active Problem List   Diagnosis Date Noted  . 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 Hypothyroidism 01/14/2018  . HLD (hyperlipidemia) 01/14/2018  . CVA (cerebral vascular accident) ([place]) 01/12/2018  . Chest pain 01/11/2018  . Acute respiratory failure with hypoxia ([place]) 07/20/2017  . Abdominal bloating 07/20/2017  . Sinus tachycardia 07/20/2017  . SLE (systemic lupus erythematosus related syndrome) ([place]) 07/20/2017  . CAP (community acquired pneumonia) 07/16/2017  . Long term (current) use of anticoagulants [Z79.01] 06/21/2017  . Screening examination for infectious disease 01/28/2016  . Type 2 diabetes mellitus ([place]) 08/03/2015  . Urinary tract infection 07/20/2015  . Vascular dementia ([place]) 05/08/2015  . Hemiparesis as late effect of cerebrovascular accident (CVA) ([place]) 05/08/2015  . Aphasia as late effect of stroke 05/08/2015  . C. difficile colitis 02/25/2015  . Hypokalemia 02/25/2015  . CKD (chronic kidney disease) 02/25/2015  . Colitis 02/14/2015  . Sepsis ([place]) 02/14/2015  . Nausea vomiting and diarrhea 02/14/2015  . Abdominal pain 02/14/2015  . UTI (lower urinary tract infection) 02/14/2015  . Abdominal pain, chronic, generalized 01/07/2015  . Candida esophagitis ([place]) 11/12/2014  . Abnormal CT of the abdomen   . Steroid-induced hyperglycemia 11/09/2014  . Hypomagnesemia 11/06/2014  . Abdominal pain, acute   . Supratherapeutic INR 11/02/2014  . Jejunitis 11/02/2014  . Myalgia and myositis 04/05/2014  . Wellness examination 04/05/2014  . Menopausal state 04/05/2014  . Palpitations 08/31/2013  . Hyperlipidemia 08/31/2013  . Disorder of heart rhythm 08/13/2013  . TIA (transient ischemic attack) 06/20/2013  . Gout flare: R elbow and R shoulder  06/20/2013  . Acute encephalopathy 06/14/2013  . Rhabdomyolysis 06/14/2013  . History of stroke with residual effects   . Right sided weakness   . Breast mass, left 04/30/2013  . Contusion of knee, left 10/18/2012  . Depression 10/05/2012  . Encounter for long-term (current) use of other medications 10/04/2012  . Multiple thyroid nodules 06/08/2012  . Risk for falls 05/05/2012  . Physical deconditioning 05/05/2012  . Fever 04/27/2012  . DVT (deep venous thrombosis) ([place]) 06/17/2011  . Expressive aphasia 06/17/2011  . Dysphasia 06/11/2011  . Incontinence of urine 06/11/2011  . Hand pain, left 06/11/2011  . Immunosuppressive management encounter following kidney transplant 06/07/11  . Hypertension goal BP (blood pressure) < 130/80 06-07-2011  . Deceased-donor kidney transplant recipient Jun 07, 2011  . Arm pain, left 05/07/2011  . Dysfunctional voiding of urine 04/28/2011  . Urge incontinence 04/28/2011  . Urinary urgency 04/28/2011  . Routine general medical examination at a health care facility 04/13/2011  . Anticoagulated 01/08/2011  . Pulmonary embolism ([place]) 07/16/2010  . Cerebral artery occlusion with cerebral infarction ([place]) 04/17/2010  . CEREBROVASCULAR ACCIDENT, ACUTE 04/15/2010  . THYROID NODULE, LEFT 04/10/2009  . Cough 03/26/2009  . ACUTE BRONCHITIS 08/22/2007  . KIDNEY TRANSPLANTATION, HX OF 08/22/2007  . Gout 08/21/2007  . Essential hypertension 08/21/2007  . Chronic diastolic congestive heart failure ([place]) 08/21/2007  . GERD 08/21/2007  . Disorder resulting from impaired renal function 08/21/2007  . LUPUS 08/21/2007  . Osteoporosis 08/21/2007  . DVT, HX OF 08/21/2007  . Enteritis due to Clostridium difficile 08/21/2007   Past Medical History:  Past Medical History:  Diagnosis Date  . Anxiety   . Candida esophagitis ([place]) 11/12/2014  . CEREBROVASCULAR ACCIDENT, ACUTE 04/15/2010  . CLOSTRIDIUM DIFFICILE COLITIS, HX  OF 08/21/2007  . CONGESTIVE HEART FAILURE  08/21/2007  . Current use of long term anticoagulation    Dr. Andree Elk, [facility]  . CVA 04/17/2010  . Depression    Dr. Andree Elk, [place], TYPE II 08/21/2007  . DVT, HX OF 08/21/2007  . GERD 08/21/2007  . GOUT 08/21/2007  . History of stroke with residual effects   . HYPERLIPIDEMIA 08/21/2007  . HYPERTENSION 08/21/2007   Dr. Andree Elk, [place], HX OF 08/22/2007   s/p renal transplant-Dr. Andree Elk, [place] 08/21/2007  . OSTEOPOROSIS 08/21/2007   Rheumatol at [facility]  . Pulmonary embolism ([place]) 07/16/2010  . Renal failure   . RENAL INSUFFICIENCY 08/21/2007  . Right sided weakness   . Steroid-induced hyperglycemia 11/09/2014  . Tachycardia   . THYROID NODULE, LEFT 04/10/2009   Past Surgical History:  Past Surgical History:  Procedure Laterality Date  . CESAREAN SECTION    . CHOLECYSTECTOMY    . ENTEROSCOPY N/A 11/11/2014   Procedure: ENTEROSCOPY;  Surgeon: Ladene Artist, MD;  Location: [facility] ENDOSCOPY;  Service: Endoscopy;  Laterality: N/A;  . KIDNEY TRANSPLANT Right 2009  . RENAL BIOPSY, OPEN  1981  . TUBAL LIGATION     HPI:  Connie Ruiz is a 59 y.o. 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  MRI was negative for acute change and CXR reported clear lungs.  Pt has a hx of dysphagia and aphasia, and she was seen for ST in 2015.  Recommendations were for Dys 3 solids and thin liquids at that time.    Assessment / Plan / Recommendation Clinical Impression  Pt was seen for a cognitive-linguistic evaluation in the setting of a possible  TIA/CVA.  Pt was awake/alert and she was oriented x4. Pt has a known hx of aphasia secondary to previous CVAs.  She reported that she lives alone, but that her daughter lives nearby and that she assists the pt with her financial management.  Pt presents with mild expressive and receptive aphasia which is suspected to be baseline for the pt.  Per chart review, pt's aphasia has improved from a previous evaluation in 2015.  She exhibited mild difficulty with complex yes/no questions, sentence repetition, and confrontational naming.  Additionally observed anomia in conversational speech.  Pt benefited from semantic and phonemic cues.  She exhibited mild dysarthria with speech approximately 90% intelligible at the word, phrase, and sentence level.  Pt reported baseline dysarthria, but stated that her rate of speech was currently slower than normal.  Brief cognitive evaluation was conducted; however, language deficits may have impacted results.  Pt exhibited good attention to tasks, awareness, and safety judgement.  She exhibited mild deficits in short-term memory, and mild-moderate deficits in numeric problem solving.  Recommend outpatient or home health ST targeting cognitive-linguistic deficits and continued assistance with IADLs at time of discharge.  SLP will f/u per POC.     SLP Assessment  SLP Recommendation/Assessment: Patient needs continued Speech Lanaguage Pathology Services SLP Visit Diagnosis: Dysarthria and anarthria (R47.1);Aphasia (R47.01);Cognitive communication deficit (R41.841)    Follow Up Recommendations  Outpatient SLP    Frequency and Duration min 2x/week  2 weeks  SLP Evaluation Cognition  Overall Cognitive Status: No family/caregiver present to determine baseline cognitive functioning Arousal/Alertness: Awake/alert Orientation Level: Oriented X4 Attention: Sustained Sustained Attention: Appears intact Memory: Impaired Memory Impairment: Decreased short term  memory Decreased Short Term Memory: Verbal basic Immediate Memory Recall: Sock;Blue;Bed Memory Recall Sock: Without Cue Memory Recall Blue: Without Cue Memory Recall Bed: With Cue Awareness: Appears intact Problem Solving: Impaired Problem Solving Impairment: Verbal complex Safety/Judgment: Appears intact       Comprehension  Auditory Comprehension Overall Auditory Comprehension: Impaired at baseline Yes/No Questions: Impaired Basic Biographical Questions: 76-100% accurate Basic Immediate Environment Questions: 75-100% accurate Complex Questions: 50-74% accurate Commands: Within Functional Limits Conversation: Complex EffectiveTechniques: Repetition Reading Comprehension Reading Status: Not tested    Expression Expression Primary Mode of Expression: Verbal Verbal Expression Overall Verbal Expression: Impaired Initiation: No impairment Level of Generative/Spontaneous Verbalization: Conversation Repetition: Impaired Level of Impairment: Sentence level Naming: Impairment Responsive: Not tested Confrontation: Impaired Convergent: 75-100% accurate Divergent: Not tested Pragmatics: No impairment Effective Techniques: Phonemic cues;Semantic cues Non-Verbal Means of Communication: Not applicable Other Verbal Expression Comments: halting and anomia during conversational speech  Written Expression Written Expression: Not tested   Oral / Motor  Oral Motor/Sensory Function Overall Oral Motor/Sensory Function: Mild impairment Facial ROM: Reduced right Facial Symmetry: Abnormal symmetry right Facial Sensation: Within Functional Limits Lingual ROM: Within Functional Limits Lingual Symmetry: Within Functional Limits Motor Speech Overall Motor Speech: Impaired Respiration: Within functional limits Phonation: Normal Resonance: Within functional limits Articulation: Impaired Level of Impairment: Word Intelligibility: Intelligibility reduced Word: 75-100% accurate Phrase:  75-100% accurate Sentence: 75-100% accurate Conversation: 75-100% accurate   GO                   Colin Mulders M.S., CCC-SLP Acute Rehabilitation Services Office: [phone]  Elvia Collum Jarvin Ogren 08/13/2019, 9:45 AM

## 2019-08-13 NOTE — Progress Notes (Signed)
[place] for warfarin  Indication: pulmonary embolus, DVT, stroke and APS  Allergies  Allergen Reactions  . Oxycodone-Acetaminophen Shortness Of Breath and Nausea Only  . Propoxyphene Nausea Only and Shortness Of Breath    States takes tylenol at home  . Propoxyphene N-Acetaminophen Shortness Of Breath and Nausea Only  . Sulfonamide Derivatives Shortness Of Breath and Nausea Only  . Codeine Nausea Only  . Hydrocodone-Acetaminophen   . Hydromorphone   . Other Other (See Comments)    No blood, Jehovaeh Witness   . Oxycodone   . Sulfamethoxazole   . Tape     Redness**PAPER TAPE OK**  . Gabapentin Anxiety    twitching  . Latex Rash  . Metoprolol Rash  . Morphine And Related Rash    IV site on arm is red, patient reports this is improving.  NO shortness of breath reported.  . Rosiglitazone Rash    Patient Measurements: Height: 5\' 4"  (162.6 cm) Weight: 183 lb 3.2 oz (83.1 kg) IBW/kg (Calculated) : 54.7 Heparin Dosing Weight: 72.8kg  Vital Signs: Temp: 98.4 F (36.9 C) (03/08 0330) Temp Source: Oral (03/08 0330) BP: 148/93 (03/08 0330) Pulse Rate: 93 (03/08 0330)  Labs: Recent Labs    08/12/19 1103 08/12/19 1103 08/12/19 1106 08/13/19 0607  HGB 14.4   < > 15.3* 12.7  HCT 44.9  --  45.0 38.7  PLT 213  --   --  205  APTT 31  --   --   --   LABPROT 13.3  --   --  15.7*  INR 1.0  --   --  1.3*  HEPARINUNFRC  --   --   --  1.74*  CREATININE 0.86  --  0.60  --    < > = values in this interval not displayed.    Estimated Creatinine Clearance: 80 mL/min (by C-G formula based on SCr of 0.6 mg/dL).  Assessment: 25 yof presented to the ED with slurred speech and worsening right sided weakness. Code Stroke was activated but MRI was negative for an acute stroke. Pt is on chronic warfarin for history VTE, stroke and antiphospholipid syndrome. INR is subtherapeutic at 1 on admit. Pharmacy consulted to manage warfarin  inpatient.  Heparin level 1.74 this morning but likely inaccurate since drip is infusing on left and heparin level drawn above infusion. Patient refused foot sticks. Spoke with Dr Jerilee Hoh about this issue and we decided to proceed with Lovenox SQ.   INR is subtherapeutic at 1.3 today. Of note, warfarin was not given last night because patient was NPO. No bleeding noted, CBC is normal.  Goal of Therapy:  INR 2-3 Anti-Xa level 0.6-1 units/ml 4hrs after LMWH dose given Monitor platelets by anticoagulation protocol: Yes   Plan:  Discontinue heparin drip Lovenox 80 mg SQ q12h Warfarin 5mg  PO tonight Daily INR, CBC q72h while on Lovenox Monitor for s/sx of bleeding  Thank you for involving pharmacy in this patient's care.  Renold Genta, PharmD, BCPS Clinical Pharmacist Clinical phone for 08/13/2019 until 3p is [phone] 08/13/2019 8:32 AM  **Pharmacist phone directory can be found on [email or web address] listed under [place]**

## 2019-08-13 NOTE — Progress Notes (Signed)
EEG complete - results pending 

## 2019-08-13 NOTE — Consult Note (Signed)
   [facility] CM Inpatient Consult   08/13/2019  Connie Ruiz [birth year] [id number]  Alerted by the [place] of active patient's admission.  Patient is currently active with [facility] for chronic disease management services in the [employer].  Patient has been engaged by a [facility] for the Hypertension management program.  Our community based plan of care has focused on disease management and community resource support.    Chart reviewed for History and Physical from MD on yesterday 08/12/2019 which includes but not limited to reveals as follows: Connie Ruiz is a 59 y.o. 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  Patient is still being evaluated.  Plan: Follow with  Inpatient Transition Of Care [TOC] team member and to make aware that [facility] following. Will plan to re assign if appropriate at disposition.  Of note, [facility] Care Management services does not replace or interfere with any services that are needed or arranged by inpatient [facility] care management team.  For additional questions or referrals please contact:  Natividad Brood, RN BSN [facility] Liaison  [phone] business mobile phone Toll free office [phone]  Fax number: [phone] [email or web address]

## 2019-08-13 NOTE — Evaluation (Signed)
Physical Therapy Evaluation Patient Details Name: Connie Ruiz MRN: [id number] DOB: May 01, 1961 Today's Date: 08/13/2019   History of Present Illness  Connie Ruiz is a 58 y.o. 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  Clinical Impression  Pt in bed upon arrival of PT, agreeable to evaluation at this time. Prior to admission the pt was dependent on assist from aides for all self-care and mobility. The pt now presents with limitations in functional mobility, strength, and dynamic stability due to above dx, and will continue to benefit from skilled PT to address these deficits. The pt was able to demo a short bout of OOB activity, but requires at least minA of 1 for all bed mobility, transfers and ambulation at this time. Therefore, I recommend d/c to SNF where she could continue to get 24/7 supervision in addition to continued rehab to maximize safety with mobility.     Follow Up Recommendations SNF;Supervision/Assistance - 24 hour    Equipment Recommendations  Rolling walker with 5" wheels    Recommendations for Other Services       Precautions / Restrictions Precautions Precautions: Fall Restrictions Weight Bearing Restrictions: No      Mobility  Bed Mobility Overal bed mobility: Needs Assistance Bed Mobility: Supine to Sit;Sit to Supine     Supine to sit: Supervision Sit to supine: Min assist   General bed mobility comments: pt able to move to sitting EOB with use of VCs and hand rails, needs assist with BLE to return to bed. Pt able to bridge to reposition without assist  Transfers Overall transfer level: Needs  assistance Equipment used: Rolling walker (2 wheeled) Transfers: Sit to/from Stand Sit to Stand: Min assist         General transfer comment: minA to come to full stand, pt with increased time and RW for safety  Ambulation/Gait Ambulation/Gait assistance: Min guard Gait Distance (Feet): 3 Feet Assistive device: Rolling walker (2 wheeled) Gait Pattern/deviations: Step-to pattern;Decreased stride length;Trunk flexed   Gait velocity interpretation: <1.31 ft/sec, indicative of household ambulator General Gait Details: pt able to take 5 side steps to [facility] with use of RW and minG for safety, pt with no LOB but with slow shuffling steps  Stairs            Wheelchair Mobility    Modified Rankin (Stroke Patients Only) Modified Rankin (Stroke Patients Only) Pre-Morbid Rankin Score: Moderate disability Modified Rankin: Moderately severe disability     Balance Overall balance assessment: Needs assistance Sitting-balance support: Bilateral upper extremity supported;Feet supported Sitting balance-Leahy Scale: Fair Sitting balance - Comments: supervision for safety with UE support   Standing balance support: Bilateral upper extremity supported;During functional activity Standing balance-Leahy Scale: Poor Standing balance comment: reliant on BUE supprt                             Pertinent Vitals/Pain Pain Assessment: 0-10 Pain Score: 9  Pain Location: entire R-side Pain Descriptors / Indicators: Sharp;Grimacing Pain Intervention(s): Limited activity within patient's tolerance;Patient requesting pain meds-RN notified;Monitored during session;Repositioned    Home Living  Prior Function                 Hand Dominance        Extremity/Trunk Assessment   Upper Extremity Assessment Upper Extremity Assessment: Defer to OT evaluation    Lower Extremity Assessment Lower Extremity Assessment: Overall WFL for tasks assessed     Cervical / Trunk Assessment Cervical / Trunk Assessment: Normal  Communication      Cognition Arousal/Alertness: Lethargic Behavior During Therapy: Agitated;WFL for tasks assessed/performed;Flat affect Overall Cognitive Status: No family/caregiver present to determine baseline cognitive functioning                                 General Comments: pt easily agitated with requests to progress mobility, but agreeable to cooperate with continued explanation and slow pace      General Comments      Exercises     Assessment/Plan    PT Assessment Patient needs continued PT services  PT Problem List Decreased strength;Decreased mobility;Decreased range of motion;Decreased activity tolerance;Obesity;Decreased safety awareness;Decreased balance;Decreased knowledge of use of DME;Pain       PT Treatment Interventions DME instruction;Therapeutic exercise;Balance training;Gait training;Functional mobility training;Therapeutic activities;Patient/family education    PT Goals (Current goals can be found in the Care Plan section)  Acute Rehab PT Goals Patient Stated Goal: return home PT Goal Formulation: With patient Time For Goal Achievement: 08/27/19 Potential to Achieve Goals: Fair    Frequency Min 3X/week   Barriers to discharge Decreased caregiver support pt previously living alone    Co-evaluation               AM-PAC PT "6 Clicks" Mobility  Outcome Measure Help needed turning from your back to your side while in a flat bed without using bedrails?: A Little Help needed moving from lying on your back to sitting on the side of a flat bed without using bedrails?: A Little Help needed moving to and from a bed to a chair (including a wheelchair)?: A Little Help needed standing up from a chair using your arms (e.g., wheelchair or bedside chair)?: A Little Help needed to walk in hospital room?: A Lot Help needed climbing 3-5 steps with a railing? : A Lot 6  Click Score: 16    End of Session Equipment Utilized During Treatment: Gait belt Activity Tolerance: Patient tolerated treatment well Patient left: in bed;with call bell/phone within reach;with bed alarm set Nurse Communication: Mobility status PT Visit Diagnosis: Unsteadiness on feet (R26.81);Difficulty in walking, not elsewhere classified (R26.2);Muscle weakness (generalized) (M62.81);Pain Pain - Right/Left: Right Pain - part of body: Shoulder;Arm;Hand;Knee;Leg    Time: 5993-5701 PT Time Calculation (min) (ACUTE ONLY): 39 min   Charges:   PT Evaluation $PT Eval Moderate Complexity: 1 Mod PT Treatments $Gait Training: 8-22 mins $Therapeutic Activity: 8-22 mins        Karma Ganja, PT, DPT   Acute Rehabilitation Department Pager #: [phone]  Connie Ruiz 08/13/2019, 3:07 PM

## 2019-08-13 NOTE — Evaluation (Addendum)
Clinical/Bedside Swallow Evaluation Patient Details  Name: Connie Ruiz MRN: [id number] Date of Birth: [birth year]  Today's Date: 08/13/2019 Time: SLP Start Time (ACUTE ONLY): 9702 SLP Stop Time (ACUTE ONLY): 0852 SLP Time Calculation (min) (ACUTE ONLY): 17 min  Past Medical History:  Past Medical History:  Diagnosis Date  . Anxiety   . Candida esophagitis ([place]) 11/12/2014  . CEREBROVASCULAR ACCIDENT, ACUTE 04/15/2010  . CLOSTRIDIUM DIFFICILE COLITIS, HX OF 08/21/2007  . CONGESTIVE HEART FAILURE 08/21/2007  . Current use of long term anticoagulation    Dr. Andree Elk, [facility]  . CVA 04/17/2010  . Depression    Dr. Andree Elk, [place], TYPE II 08/21/2007  . DVT, HX OF 08/21/2007  . GERD 08/21/2007  . GOUT 08/21/2007  . History of stroke with residual effects   . HYPERLIPIDEMIA 08/21/2007  . HYPERTENSION 08/21/2007   Dr. Andree Elk, [place], HX OF 08/22/2007   s/p renal transplant-Dr. Andree Elk, [place] 08/21/2007  . OSTEOPOROSIS 08/21/2007   Rheumatol at [facility]  . Pulmonary embolism ([place]) 07/16/2010  . Renal failure   . RENAL INSUFFICIENCY 08/21/2007  . Right sided weakness   . Steroid-induced hyperglycemia 11/09/2014  . Tachycardia   . THYROID NODULE, LEFT 04/10/2009   Past Surgical History:  Past Surgical History:  Procedure Laterality Date  . CESAREAN SECTION    . CHOLECYSTECTOMY    . ENTEROSCOPY N/A 11/11/2014   Procedure: ENTEROSCOPY;  Surgeon: Ladene Artist, MD;  Location: [facility];  Service: Endoscopy;  Laterality: N/A;  . KIDNEY TRANSPLANT Right 2009  . RENAL BIOPSY, OPEN  1981  . TUBAL LIGATION     HPI:  Connie Ruiz is a 59 y.o. 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  MRI was negative for acute change and CXR reported clear lungs.  Pt has a hx of dysphagia and aphasia, and she was seen by ST in 2013 and 2015.  Most recent diet recommendations were for Dys 3 solids and thin liquids.    Assessment / Plan / Recommendation Clinical Impression  Pt was seen for a bedside swallow evaluation and she presents with mild oral dysphagia with suspected functional pharyngeal phase of the swallow.  Pt has a hx of dysphagia following previous CVAs.  She reported that she has been consuming regular solids and thin liquids at home, but stated that she occasionally has difficulty with solid foods.  Oral mechanism exam was remarkable for a R facial droop and reduced facial ROM on the R side; however, suspect that this is residual from previous CVAs.  She exhibited mild-moderately prolonged mastication with regular solids, but mastication was effective and no oral residue was observed following swallow initiation.  Attempted the [employer] challenge; however, pt was unable to continuously drink liquid.  No overt s/sx of aspiration were observed with thin liquid, puree, or regular solid trials.  Spoke with pt regarding recommendations for Dysphagia 2 (ground) solids; however, she refused.  She was agreeable to Dysphagia 3 (soft) solids at this time.  Therefore, recommend initiation of Dysphagia 3 (soft) solids and thin liquids with medications administered whole in puree.  Pt would benefit from intermittent supervision to  cue for the following compensatory strategies: 1) Small bites/sips 2) Slow rate of intake 3) Sit upright as possible.    SLP Visit Diagnosis: Dysphagia, oral phase (R13.11)    Aspiration Risk  Mild aspiration risk    Diet Recommendation Dysphagia 3 (Mech soft);Thin liquid   Liquid Administration via:  Cup;Straw Medication Administration: Whole meds with puree Supervision: Patient able to self feed Compensations: Minimize environmental distractions;Slow rate;Small sips/bites Postural Changes: Seated upright at 90 degrees    Other  Recommendations Oral Care Recommendations: Oral care BID   Follow up Recommendations Outpatient SLP      Frequency and Duration min 2x/week  2 weeks       Prognosis Prognosis for Safe Diet Advancement: [place] Date of Onset: 08/12/19 HPI: Connie Ruiz is a 59 y.o. 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  MRI was negative for acute change and CXR reported clear lungs.  Pt has a hx of dysphagia and aphasia, and she was seen for ST in 2015.  Recommendations were for Dys 3 solids and thin liquids at that time.  Type of Study: Bedside Swallow Evaluation Previous Swallow Assessment: See HPI Diet Prior to this Study: NPO Temperature Spikes Noted: Yes Respiratory Status: Room air History of Recent Intubation: No Behavior/Cognition: Alert;Cooperative;Pleasant mood Oral Cavity Assessment: Dry Oral Care Completed by SLP: No Oral Cavity - Dentition: Adequate natural dentition Vision: Functional for self-feeding Self-Feeding Abilities: Able to feed self Patient Positioning: Upright in bed Baseline Vocal Quality: Normal Volitional Swallow: Able to elicit    Oral/Motor/Sensory Function Overall Oral Motor/Sensory Function: Mild impairment Facial ROM: Reduced right Facial Symmetry: Abnormal symmetry right Facial  Sensation: Within Functional Limits Lingual ROM: Within Functional Limits Lingual Symmetry: Within Functional Limits   Ice Chips Ice chips: Within functional limits Presentation: Spoon   Thin Liquid Thin Liquid: Within functional limits Presentation: Cup;Spoon;Straw;Self Fed    Nectar Thick Nectar Thick Liquid: Not tested   Honey Thick Honey Thick Liquid: Not tested   Puree Puree: Within functional limits   Solid     Solid: Impaired Presentation: Self Fed Oral Phase Impairments: Impaired mastication Oral Phase Functional Implications: Impaired mastication;Prolonged oral transit     Colin Mulders M.S., CCC-SLP Acute Rehabilitation Services Office: [phone]  Elvia Collum Tangela Dolliver 08/13/2019,9:24 AM

## 2019-08-13 NOTE — Evaluation (Signed)
Occupational Therapy Evaluation Patient Details Name: Connie Ruiz MRN: [id number] DOB: 11/02/1960 Today's Date: 08/13/2019    History of Present Illness Connie Ruiz is a 59 y.o. 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   Clinical Impression   This 59 yo female admitted with above and whose PLOF was limited with PCA 7 days a week for 1:45 each day per her report (helped her with bathing, dressing, toileting, and food prep). Pt does report she tries to do her own sponge bath occasionally and toilet herself. Pt now limited by pain in right side as well as decreased mobility all affecting her ability to do for herself as much as possible. She will benefit from acute OT with follow at SNF to work towards a more functional level and be able to do more for herself with PCA is not there.    Follow Up Recommendations  SNF;Supervision/Assistance - 24 hour    Equipment Recommendations  Other (comment)(TBD next venue)       Precautions / Restrictions Precautions Precautions: Fall Restrictions Weight Bearing Restrictions: No      Mobility Bed Mobility Overal bed mobility: Needs Assistance        General bed mobility comments: To scoot up to [facility] bed placed in trendelenburg and pt able to use both UEs and LEs to scoot herself up in bed without assist. Pt declined to sit up on EOB         ADL either performed or assessed with clinical judgement   ADL Overall ADL's : Needs assistance/impaired Eating/Feeding: Set up;Bed level   Grooming: Set up;Bed level   Upper Body Bathing: Minimal assistance;Bed level   Lower Body  Bathing: Total assistance;Bed level   Upper Body Dressing : Maximal assistance;Bed level   Lower Body Dressing: Total assistance;Bed level                       Vision Baseline Vision/History: Wears glasses Wears Glasses: Distance only Patient Visual Report: No change from baseline              Pertinent Vitals/Pain Pain Assessment: 0-10 Pain Score: 7  Pain Location: entire R-side Pain Descriptors / Indicators: Sharp;Grimacing;Guarding Pain Intervention(s): Limited activity within patient's tolerance;Monitored during session(pt reports she has received her pain meds)     Hand Dominance Left   Extremity/Trunk Assessment Upper Extremity Assessment Upper Extremity Assessment: RUE deficits/detail RUE Deficits / Details: slower movements as compared to LUE. I asked her to raise her arm reaching up to ceiling and she said she could not ( I helped her do this and asked her to try and hold it there, she could not but she did control the descent. However when I asked if she wanted me to help her slide up in bed she said yes and raised her RUE up for side rail to A with scooting up in bed       Communication Communication Communication: Expressive difficulties   Cognition Arousal/Alertness: Lethargic Behavior During Therapy: Flat affect Overall Cognitive Status: No family/caregiver present to determine baseline cognitive functioning  General Comments: pt not wanting to get up, "I am too tired", explained benefit but she politely replied no              Home Living Family/patient expects to be discharged to:: Skilled nursing facility Living Arrangements: Alone Available Help at Discharge: Family(dtr lives nearby) Type of Home: Apartment Home Access: Level entry     Home Layout: One level     Bathroom Shower/Tub: Teacher, early years/pre: Handicapped height     Home Equipment: Grab bars - tub/shower;Grab  bars - toilet;Shower seat;Cane - single point;Walker - 2 wheels   Additional Comments: aide comes 7 days/wk for ~4 hrs/day (told OT only for 1:45)  Lives With: Alone    Prior Functioning/Environment Level of Independence: Needs assistance  Gait / Transfers Assistance Needed: uses AD for community ambulation ADL's / Homemaking Assistance Needed: aides do all ADLs, pt reports waiting for aides to eat/bathe/dress each morning; reports she tries to toilet herself the best she can when aid is not there Communication / Swallowing Assistance Needed: expressive aphasia          OT Problem List: Decreased strength;Decreased activity tolerance;Impaired balance (sitting and/or standing);Impaired UE functional use;Pain      OT Treatment/Interventions: Self-care/ADL training;DME and/or AE instruction;Patient/family education;Balance training;Therapeutic activities;Therapeutic exercise    OT Goals(Current goals can be found in the care plan section) Acute Rehab OT Goals Patient Stated Goal: to go home OT Goal Formulation: With patient Time For Goal Achievement: 08/27/19 Potential to Achieve Goals: Fair  OT Frequency: Min 2X/week   Barriers to D/C: Decreased caregiver support             AM-PAC OT "6 Clicks" Daily Activity     Outcome Measure Help from another person eating meals?: A Little Help from another person taking care of personal grooming?: A Little Help from another person toileting, which includes using toliet, bedpan, or urinal?: Total Help from another person bathing (including washing, rinsing, drying)?: A Lot Help from another person to put on and taking off regular upper body clothing?: Total Help from another person to put on and taking off regular lower body clothing?: A Lot 6 Click Score: 12   End of Session Nurse Communication: (no needs)  Activity Tolerance: Patient limited by fatigue Patient left: in bed;with call bell/phone within reach;with bed alarm set  OT  Visit Diagnosis: Unsteadiness on feet (R26.81);Other abnormalities of gait and mobility (R26.89);Muscle weakness (generalized) (M62.81);Pain Pain - Right/Left: Right Pain - part of body: (side)                Time: 8242-3536 OT Time Calculation (min): 14 min Charges:  OT General Charges $OT Visit: 1 Visit OT Evaluation $OT Eval Low Complexity: 1 Low Cathy OTR/L Acute Rehab Services Pager [phone] Office [phone]     08/13/2019, 4:13 PM

## 2019-08-13 NOTE — Progress Notes (Signed)
Patient getting aggravated about not having pain medications. States right arm and leg are 8/10 pain. Patient states she takes Tylenol and Tramadol at home. MD Isaac Bliss paged [phone] with no response. [place]

## 2019-08-14 LAB — PROTIME-INR
INR: 1.1 (ref 0.8–1.2)
Prothrombin Time: 13.6 seconds (ref 11.4–15.2)

## 2019-08-14 LAB — GLUCOSE, CAPILLARY
Glucose-Capillary: 122 mg/dL — ABNORMAL HIGH (ref 70–99)
Glucose-Capillary: 119 mg/dL — ABNORMAL HIGH (ref 70–99)
Glucose-Capillary: 211 mg/dL — ABNORMAL HIGH (ref 70–99)
Glucose-Capillary: 156 mg/dL — ABNORMAL HIGH (ref 70–99)
Glucose-Capillary: 151 mg/dL — ABNORMAL HIGH (ref 70–99)

## 2019-08-14 MED ORDER — WARFARIN SODIUM 5 MG PO TABS
5.0000 mg | ORAL_TABLET | ORAL | Status: AC
  Administered 2019-08-14: 5 mg via ORAL
  Filled 2019-08-14: qty 1

## 2019-08-14 MED ORDER — WARFARIN SODIUM 2.5 MG PO TABS
2.5000 mg | ORAL_TABLET | Freq: Once | ORAL | Status: AC
  Administered 2019-08-14: 2.5 mg via ORAL
  Filled 2019-08-14: qty 1

## 2019-08-14 NOTE — TOC Progression Note (Signed)
Transition of Care [facility]) - Progression Note    Patient Details  Name: Connie Ruiz MRN: [id number] Date of Birth: 24-Mar-1961  Transition of Care [facility]) CM/SW [place], [place] Work Phone Number: 08/14/2019, 11:34 AM  Clinical Narrative:    MSW Intern provided bed offers to pt, who did not seem engaged in the conversation. She requested her daughter be contacted to help make the decision. Pt's daughter accepted the offers and is currently leaning towards [facility], pending her paying off a balance. Pt was there last year for a similar problem. She has concerns that her mother will continue to do nothing when she is out of rehab. She said that she will contact SW with decisions this afternoon. Linus Orn with [facility] was contacted. SW will continue to follow.   Expected Discharge Plan: Skilled Nursing Facility Barriers to Discharge: Ship broker, Continued Medical Work up  Expected Discharge Plan and Services Expected Discharge Plan: [place]                           DME Agency: NA                   Social Determinants of Health (SDOH) Interventions    Readmission Risk Interventions No flowsheet data found.

## 2019-08-14 NOTE — Plan of Care (Signed)
  Problem: Nutrition: Goal: Adequate nutrition will be maintained Outcome: Progressing   Problem: Pain Managment: Goal: General experience of comfort will improve Outcome: Progressing   

## 2019-08-14 NOTE — Progress Notes (Signed)
  Speech Language Pathology Treatment: Dysphagia  Patient Details Name: Connie Ruiz MRN: [id number] DOB: Oct 10, 1960 Today's Date: 08/14/2019 Time: 7106-2694 SLP Time Calculation (min) (ACUTE ONLY): 8 min  Assessment / Plan / Recommendation Clinical Impression  Pt was seen for skilled ST targeting dysphagia and diet tolerance.  She was encountered awake/alert and was agreeable to PO trials.  Pt and RN both stated that the pt was tolerating her current diet without difficulty.  Pt was observed with trials of thin liquid via straw sip and regular solids.  She exhibited mild to moderately prolonged but effective mastication of regular solids and no oral residue was observed following swallow initiation.  No overt s/sx of aspiration were observed with thin liquid or regular solids on this date.  Session was abbreviated secondary to pt request, therefore aphasia was not targeted; however, observed intermittent anomia in conversational speech.  Pt benefited from extra time, gestures, and semantic cues to assist with communication breakdowns.  Pt stated that she would prefer to remain on her current diet at this time.  Therefore, recommend continuation of Dysphagia 3 (soft) solids and thin liquids with medications whole in puree.  SLP will f/u for dysphagia and cognitive-linguistic treatment per POC.    HPI HPI: Connie Ruiz is a 59 y.o. 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  MRI was negative for acute change and CXR reported clear lungs.  Pt has a hx of dysphagia and aphasia, and she was  seen for ST in 2015.  Recommendations were for Dys 3 solids and thin liquids at that time.       SLP Plan  Continue with current plan of care       Recommendations  Diet recommendations: Dysphagia 3 (mechanical soft);Thin liquid Liquids provided via: Cup;Straw Medication Administration: Whole meds with puree Supervision: Patient able to self feed;Intermittent supervision to cue for compensatory strategies Compensations: Minimize environmental distractions;Slow rate;Small sips/bites Postural Changes and/or Swallow Maneuvers: Seated upright 90 degrees                Oral Care Recommendations: Oral care BID Follow up Recommendations: Outpatient SLP SLP Visit Diagnosis: Dysphagia, oral phase (R13.11) Plan: Continue with current plan of care       Allen., M.S., [place] Office: [phone]  [employer] 08/14/2019, 12:39 PM

## 2019-08-14 NOTE — Progress Notes (Signed)
[place] for warfarin  Indication: pulmonary embolus, DVT, stroke and APS  Allergies  Allergen Reactions  . Oxycodone-Acetaminophen Shortness Of Breath and Nausea Only  . Propoxyphene Nausea Only and Shortness Of Breath    States takes tylenol at home  . Propoxyphene N-Acetaminophen Shortness Of Breath and Nausea Only  . Sulfonamide Derivatives Shortness Of Breath and Nausea Only  . Codeine Nausea Only  . Hydrocodone-Acetaminophen   . Hydromorphone   . Other Other (See Comments)    No blood, Jehovaeh Witness   . Oxycodone   . Sulfamethoxazole   . Tape     Redness**PAPER TAPE OK**  . Gabapentin Anxiety    twitching  . Latex Rash  . Metoprolol Rash  . Morphine And Related Rash    IV site on arm is red, patient reports this is improving.  NO shortness of breath reported.  . Rosiglitazone Rash    Patient Measurements: Height: 5\' 4"  (162.6 cm) Weight: 183 lb 3.2 oz (83.1 kg) IBW/kg (Calculated) : 54.7 Heparin Dosing Weight: 72.8kg  Vital Signs: Temp: 98 F (36.7 C) (03/09 0358) Temp Source: Oral (03/09 0358) BP: 130/102 (03/09 0358) Pulse Rate: 73 (03/09 0358)  Labs: Recent Labs    08/12/19 1103 08/12/19 1103 08/12/19 1106 08/13/19 0607 08/14/19 0322  HGB 14.4   < > 15.3* 12.7  --   HCT 44.9  --  45.0 38.7  --   PLT 213  --   --  205  --   APTT 31  --   --   --   --   LABPROT 13.3  --   --  15.7* 13.6  INR 1.0  --   --  1.3* 1.1  HEPARINUNFRC  --   --   --  1.74*  --   CREATININE 0.86  --  0.60  --   --    < > = values in this interval not displayed.    Estimated Creatinine Clearance: 80 mL/min (by C-G formula based on SCr of 0.6 mg/dL).  Assessment: 44 yof presented to the ED with slurred speech and worsening right sided weakness. Code Stroke was activated but MRI was negative for an acute stroke. Pt is on chronic warfarin for history VTE, stroke and antiphospholipid syndrome. INR is subtherapeutic at 1 on admit.  Pharmacy consulted to manage warfarin inpatient.  INR is subtherapeutic at 1.1 today. Warfarin order was inadvertently not entered yesterday - SZP completed. No bleeding noted, CBC is normal.  Goal of Therapy:  INR 2-3 Anti-Xa level 0.6-1 units/ml 4hrs after LMWH dose given Monitor platelets by anticoagulation protocol: Yes   Plan:  Lovenox 80 mg SQ q12h Warfarin 5 mg PO this morning and 2.5 mg PO tonight Daily INR, CBC q72h while on Lovenox Monitor for s/sx of bleeding  Thank you for involving pharmacy in this patient's care.  Renold Genta, PharmD, BCPS Clinical Pharmacist Clinical phone for 08/14/2019 until 3p is [phone] 08/14/2019 7:16 AM  **Pharmacist phone directory can be found on [email or web address] listed under [place]**

## 2019-08-14 NOTE — Progress Notes (Signed)
PROGRESS NOTE    [name]  CXK:[id number] DOB: Apr 06, 1961 DOA: 08/12/2019 PCP: [name], [name], MD     Brief Narrative:  59 year old woman admitted from home on 3/7 with complaints of slurred speech and worsening right-sided weakness.  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  Patient has a history of an old left MCA stroke with residual right-sided weakness.  Day of admission at around 830 she had increased right-sided weakness and slurred speech.  Admission was requested for further evaluation and management.   Assessment & Plan:   Active Problems:   Essential hypertension   Chronic diastolic congestive heart failure (HCC)   History of stroke with residual effects   Right sided weakness   Hypokalemia   Type 2 diabetes mellitus (HCC)   SLE (systemic lupus erythematosus related syndrome) (HCC)   Hypothyroidism   HLD (hyperlipidemia)   Right-sided weakness and slurred speech -MRI was negative for acute infarction, mass or hemorrhage. -She was initially admitted as a code stroke, after negative MRI and evaluation by neurology it was canceled.  Neurology is suspecting recrudescence of old stroke symptoms. -EEG without signs of acute seizure activity but indicative of cortical dysfunction of the left frontotemporal region likely secondary to prior infarct. -No further work-up is indicated by neurology team. - PT/OT consultations are recommending SNF.  TOC team is aware. -She remains on Coumadin, however because she is subtherapeutic is also on therapeutic Lovenox dosing.  History of DVT/PE -On Coumadin with Lovenox bridge  Leukocytosis -Likely due to chronic steroid use, she remains  afebrile, chest x-ray and UA are negative for sources of infection. -She is not currently on antibiotic therapy. -Leukocytosis has resolved.  Hypothyroidism -TSH is extremely elevated to 121.8 -Increase Synthroid dose from 175 to 200 mcg. -She will need repeat thyroid function tests in 4 to 6 weeks.  End-stage renal disease status post renal transplant -On chronic immunosuppressive therapy. -Renal function remains at baseline.  Chronic diastolic heart failure -Appears compensated and euvolemic.  Hyperlipidemia -Continue statin  Hypertension -Continue home medications -Blood pressure remains slightly above goal, do not anticipate medication changes while hospitalized.  Type 2 diabetes -Holding Metformin, continue SSI. -A1c is 7.3. -Fair control while hospitalized.  Anxiety, depression, bipolar disorder -Continue Zoloft, Seroquel, Remeron, Aricept    DVT prophylaxis: She is on therapeutic Lovenox dosing as a bridge to coumadin Code Status: Full code Family Communication: Patient only Disposition Plan: Awaiting DC to SNF.  Consultants:   Neurology  Procedures:   None  Antimicrobials:  Anti-infectives (From admission, onward)   None       Subjective: Lying in bed, no complaints, feels weak.  Objective: Vitals:   08/14/19 0032 08/14/19 0358 08/14/19 0840 08/14/19 1257  BP: 109/76 (!) 130/102 (!) 141/85 103/66  Pulse: 74 73 92 79  Resp: 17 17 16 14   Temp: 97.7 F (36.5 C) 98 F (36.7 C) 98 F (36.7 C) 98 F (36.7 C)  TempSrc: Oral Oral Oral Oral  SpO2: 96% 100% 99% 98%  Weight:      Height:       No intake or output data in the 24 hours ending 08/14/19 1558 Filed Weights   08/12/19 1146  Weight: 83.1 kg    Examination:  General exam: Alert, awake, oriented x  3, obese Respiratory system: Clear to auscultation. Respiratory effort normal. Cardiovascular system:RRR. No murmurs, rubs, gallops. Gastrointestinal system: Abdomen is nondistended,  soft and nontender. No organomegaly or masses felt. Normal bowel sounds heard. Central nervous system: Alert and oriented. Speech is slurred. Extremities: No C/C/E, +pedal pulses Skin: No rashes, lesions or ulcers Psychiatry: Judgement and insight appear normal. Mood & affect appropriate.     Data Reviewed: I have personally reviewed following labs and imaging studies  CBC: Recent Labs  Lab 08/12/19 1103 08/12/19 1106 08/13/19 0607  WBC 11.2*  --  8.4  NEUTROABS 6.1  --   --   HGB 14.4 15.3* 12.7  HCT 44.9 45.0 38.7  MCV 93.9  --  91.7  PLT 213  --  829   Basic Metabolic Panel: Recent Labs  Lab 08/12/19 1103 08/12/19 1106  NA 142 139  K 3.4* 3.4*  CL 107 106  CO2 22  --   GLUCOSE 146* 141*  BUN 12 13  CREATININE 0.86 0.60  CALCIUM 9.6  --    GFR: Estimated Creatinine Clearance: 80 mL/min (by C-G formula based on SCr of 0.6 mg/dL). Liver Function Tests: Recent Labs  Lab 08/12/19 1103  AST 18  ALT 14  ALKPHOS 72  BILITOT 0.8  PROT 7.3  ALBUMIN 3.9   No results for input(s): LIPASE, AMYLASE in the last 168 hours. No results for input(s): AMMONIA in the last 168 hours. Coagulation Profile: Recent Labs  Lab 08/12/19 1103 08/13/19 0607 08/14/19 0322  INR 1.0 1.3* 1.1   Cardiac Enzymes: No results for input(s): CKTOTAL, CKMB, CKMBINDEX, TROPONINI in the last 168 hours. BNP (last 3 results) No results for input(s): PROBNP in the last 8760 hours. HbA1C: Recent Labs    08/12/19 1103  HGBA1C 7.3*   CBG: Recent Labs  Lab 08/13/19 1633 08/13/19 2114 08/14/19 0620 08/14/19 0843 08/14/19 1300  GLUCAP 115* 170* 122* 119* 211*   Lipid Profile: Recent Labs    08/13/19 0607  CHOL 305*  HDL 89  LDLCALC 202*  TRIG 70  CHOLHDL 3.4   Thyroid Function Tests: Recent Labs    08/13/19 0607  TSH 121.857*   Anemia Panel: No results for input(s): VITAMINB12, FOLATE, FERRITIN, TIBC, IRON, RETICCTPCT in the last 72 hours. Urine analysis:     Component Value Date/Time   COLORURINE YELLOW 08/12/2019 1446   APPEARANCEUR CLOUDY (A) 08/12/2019 1446   LABSPEC 1.013 08/12/2019 1446   PHURINE 8.0 08/12/2019 1446   GLUCOSEU NEGATIVE 08/12/2019 1446   GLUCOSEU NEGATIVE 10/22/2013 1621   HGBUR NEGATIVE 08/12/2019 1446   BILIRUBINUR NEGATIVE 08/12/2019 1446   KETONESUR NEGATIVE 08/12/2019 1446   PROTEINUR NEGATIVE 08/12/2019 1446   UROBILINOGEN 1.0 03/27/2015 1534   NITRITE NEGATIVE 08/12/2019 1446   LEUKOCYTESUR NEGATIVE 08/12/2019 1446   Sepsis Labs: @LABRCNTIP (procalcitonin:4,lacticidven:4)  ) Recent Results (from the past 240 hour(s))  SARS CORONAVIRUS 2 (TAT 6-24 HRS) Nasopharyngeal Nasopharyngeal Swab     Status: None   Collection Time: 08/12/19  4:48 PM   Specimen: Nasopharyngeal Swab  Result Value Ref Range Status   SARS Coronavirus 2 NEGATIVE NEGATIVE Final    Comment: (NOTE) SARS-CoV-2 target nucleic acids are NOT DETECTED. The SARS-CoV-2 RNA is generally detectable in upper and lower respiratory specimens during the acute phase of infection. Negative results do not preclude SARS-CoV-2 infection, do not rule out co-infections with other pathogens, and should not be used as the sole basis for treatment or other patient management decisions. Negative results must be  combined with clinical observations, patient history, and epidemiological information. The expected result is Negative. Fact Sheet for Patients: SugarRoll.be Fact Sheet for Healthcare Providers: https://www.woods-mathews.com/ This test is not yet approved or cleared by the [place] FDA and  has been authorized for detection and/or diagnosis of SARS-CoV-2 by FDA under an Emergency Use Authorization (EUA). This EUA will remain  in effect (meaning this test can be used) for the duration of the COVID-19 declaration under Section 56 4(b)(1) of the Act, 21 U.S.C. section 360bbb-3(b)(1), unless the authorization  is terminated or revoked sooner. Performed at [facility], [address], [place], [place] [address]          Radiology Studies: EEG  Result Date: 08/13/2019 Lora Havens, MD     08/13/2019 10:43 AM Patient Name: Connie Ruiz MRN: [id number] Epilepsy Attending: Lora Havens Referring Physician/Provider: Dr. Karena Addison Aroor Date: 08/13/2019 Duration: 24.29 minutes Patient history: 59 year old female with history of left MCA stroke who presented with worsening right-sided weakness and speech disturbance.  EEG to evaluate for seizures. Level of alertness: Awake AEDs during EEG study: None Technical aspects: This EEG study was done with scalp electrodes positioned according to the 10-20 International system of electrode placement. Electrical activity was acquired at a sampling rate of 500Hz  and reviewed with a high frequency filter of 70Hz  and a low frequency filter of 1Hz . EEG data were recorded continuously and digitally stored. Description: During awake state, the posterior dominant rhythm consists of 8 Hz activity of moderate voltage (25-35 uV) seen predominantly in posterior head regions, symmetric and reactive to eye opening and eye closing.  EEG showed continuous 5 to 7 Hz theta slowing in left centro- temporal region.  Sharp transients were seen in left frontotemporal region.  Physiologic photic driving was seen during photic stimulation.  Hyperventilation was not performed. Abnormality -Continuous slow, left centro- temporal region IMPRESSION: This study is suggestive of cortical dysfunction in the left frontotemporal region likely secondary to underlying infarct. No seizures or definite epileptiform discharges were seen throughout the recording. Priyanka Barbra Sarks        Scheduled Meds: . atorvastatin  40 mg Oral q1800  . diltiazem  360 mg Oral Daily  . donepezil  10 mg Oral QHS  . enoxaparin (LOVENOX) injection  80 mg Subcutaneous Q12H  . furosemide  20 mg Oral Daily  .  insulin aspart  0-15 Units Subcutaneous TID WC  . insulin aspart  0-5 Units Subcutaneous QHS  . levothyroxine  200 mcg Oral QAC breakfast  . losartan  100 mg Oral Daily  . mouth rinse  15 mL Mouth Rinse BID  . mirtazapine  30 mg Oral QHS  . mycophenolate  250 mg Oral BID  . pantoprazole  40 mg Oral Daily  . predniSONE  5 mg Oral Q breakfast  . QUEtiapine  25 mg Oral QHS  . sertraline  50 mg Oral Daily  . tacrolimus  2 mg Oral BID  . warfarin  2.5 mg Oral Once  . Warfarin - Pharmacist Dosing Inpatient   Does not apply q1800   Continuous Infusions:   LOS: 2 days    Time spent: 25 minutes. Greater than 50% of this time was spent in direct contact with the patient, coordinating care and discussing relevant ongoing clinical issues.     Lelon Frohlich, MD [facility] Pager [phone]  If 7PM-7AM, please contact night-coverage [email or web address] Password [email or web address] 08/14/2019, 3:58 PM

## 2019-08-15 ENCOUNTER — Other Ambulatory Visit: Payer: Self-pay | Admitting: *Deleted

## 2019-08-15 DIAGNOSIS — I10 Essential (primary) hypertension: Secondary | ICD-10-CM

## 2019-08-15 DIAGNOSIS — I5032 Chronic diastolic (congestive) heart failure: Secondary | ICD-10-CM | POA: Diagnosis not present

## 2019-08-15 DIAGNOSIS — K219 Gastro-esophageal reflux disease without esophagitis: Secondary | ICD-10-CM | POA: Diagnosis not present

## 2019-08-15 DIAGNOSIS — M6259 Muscle wasting and atrophy, not elsewhere classified, multiple sites: Secondary | ICD-10-CM | POA: Diagnosis not present

## 2019-08-15 DIAGNOSIS — R110 Nausea: Secondary | ICD-10-CM | POA: Diagnosis not present

## 2019-08-15 DIAGNOSIS — M79601 Pain in right arm: Secondary | ICD-10-CM | POA: Diagnosis not present

## 2019-08-15 DIAGNOSIS — I69315 Cognitive social or emotional deficit following cerebral infarction: Secondary | ICD-10-CM | POA: Diagnosis not present

## 2019-08-15 DIAGNOSIS — I130 Hypertensive heart and chronic kidney disease with heart failure and stage 1 through stage 4 chronic kidney disease, or unspecified chronic kidney disease: Secondary | ICD-10-CM | POA: Diagnosis not present

## 2019-08-15 DIAGNOSIS — I69391 Dysphagia following cerebral infarction: Secondary | ICD-10-CM | POA: Diagnosis not present

## 2019-08-15 DIAGNOSIS — E1122 Type 2 diabetes mellitus with diabetic chronic kidney disease: Secondary | ICD-10-CM | POA: Diagnosis not present

## 2019-08-15 DIAGNOSIS — I639 Cerebral infarction, unspecified: Secondary | ICD-10-CM | POA: Diagnosis not present

## 2019-08-15 DIAGNOSIS — Z940 Kidney transplant status: Secondary | ICD-10-CM | POA: Diagnosis not present

## 2019-08-15 DIAGNOSIS — E039 Hypothyroidism, unspecified: Secondary | ICD-10-CM | POA: Diagnosis not present

## 2019-08-15 DIAGNOSIS — I499 Cardiac arrhythmia, unspecified: Secondary | ICD-10-CM | POA: Diagnosis not present

## 2019-08-15 DIAGNOSIS — E1159 Type 2 diabetes mellitus with other circulatory complications: Secondary | ICD-10-CM | POA: Diagnosis not present

## 2019-08-15 DIAGNOSIS — Z7401 Bed confinement status: Secondary | ICD-10-CM | POA: Diagnosis not present

## 2019-08-15 DIAGNOSIS — B351 Tinea unguium: Secondary | ICD-10-CM | POA: Diagnosis not present

## 2019-08-15 DIAGNOSIS — E0822 Diabetes mellitus due to underlying condition with diabetic chronic kidney disease: Secondary | ICD-10-CM | POA: Diagnosis not present

## 2019-08-15 DIAGNOSIS — Z79899 Other long term (current) drug therapy: Secondary | ICD-10-CM | POA: Diagnosis not present

## 2019-08-15 DIAGNOSIS — M79676 Pain in unspecified toe(s): Secondary | ICD-10-CM | POA: Diagnosis not present

## 2019-08-15 DIAGNOSIS — Z7901 Long term (current) use of anticoagulants: Secondary | ICD-10-CM | POA: Diagnosis not present

## 2019-08-15 DIAGNOSIS — R1311 Dysphagia, oral phase: Secondary | ICD-10-CM | POA: Diagnosis not present

## 2019-08-15 DIAGNOSIS — M6281 Muscle weakness (generalized): Secondary | ICD-10-CM | POA: Diagnosis not present

## 2019-08-15 DIAGNOSIS — R42 Dizziness and giddiness: Secondary | ICD-10-CM | POA: Diagnosis not present

## 2019-08-15 DIAGNOSIS — Z5181 Encounter for therapeutic drug level monitoring: Secondary | ICD-10-CM | POA: Diagnosis not present

## 2019-08-15 DIAGNOSIS — E785 Hyperlipidemia, unspecified: Secondary | ICD-10-CM | POA: Diagnosis not present

## 2019-08-15 DIAGNOSIS — Z20822 Contact with and (suspected) exposure to covid-19: Secondary | ICD-10-CM | POA: Diagnosis not present

## 2019-08-15 DIAGNOSIS — R109 Unspecified abdominal pain: Secondary | ICD-10-CM | POA: Diagnosis not present

## 2019-08-15 DIAGNOSIS — R519 Headache, unspecified: Secondary | ICD-10-CM | POA: Diagnosis not present

## 2019-08-15 DIAGNOSIS — I110 Hypertensive heart disease with heart failure: Secondary | ICD-10-CM | POA: Diagnosis not present

## 2019-08-15 DIAGNOSIS — M2550 Pain in unspecified joint: Secondary | ICD-10-CM | POA: Diagnosis not present

## 2019-08-15 DIAGNOSIS — G8191 Hemiplegia, unspecified affecting right dominant side: Secondary | ICD-10-CM | POA: Diagnosis not present

## 2019-08-15 DIAGNOSIS — I2510 Atherosclerotic heart disease of native coronary artery without angina pectoris: Secondary | ICD-10-CM | POA: Diagnosis not present

## 2019-08-15 DIAGNOSIS — I69320 Aphasia following cerebral infarction: Secondary | ICD-10-CM | POA: Diagnosis not present

## 2019-08-15 DIAGNOSIS — R000 Tachycardia, unspecified: Secondary | ICD-10-CM | POA: Diagnosis not present

## 2019-08-15 DIAGNOSIS — M79604 Pain in right leg: Secondary | ICD-10-CM | POA: Diagnosis not present

## 2019-08-15 DIAGNOSIS — Z743 Need for continuous supervision: Secondary | ICD-10-CM | POA: Diagnosis not present

## 2019-08-15 DIAGNOSIS — N189 Chronic kidney disease, unspecified: Secondary | ICD-10-CM | POA: Diagnosis not present

## 2019-08-15 DIAGNOSIS — Z09 Encounter for follow-up examination after completed treatment for conditions other than malignant neoplasm: Secondary | ICD-10-CM | POA: Diagnosis not present

## 2019-08-15 DIAGNOSIS — I82409 Acute embolism and thrombosis of unspecified deep veins of unspecified lower extremity: Secondary | ICD-10-CM | POA: Diagnosis not present

## 2019-08-15 DIAGNOSIS — R918 Other nonspecific abnormal finding of lung field: Secondary | ICD-10-CM | POA: Diagnosis not present

## 2019-08-15 DIAGNOSIS — N186 End stage renal disease: Secondary | ICD-10-CM | POA: Diagnosis not present

## 2019-08-15 DIAGNOSIS — E119 Type 2 diabetes mellitus without complications: Secondary | ICD-10-CM | POA: Diagnosis not present

## 2019-08-15 DIAGNOSIS — Z8673 Personal history of transient ischemic attack (TIA), and cerebral infarction without residual deficits: Secondary | ICD-10-CM | POA: Diagnosis not present

## 2019-08-15 DIAGNOSIS — R5381 Other malaise: Secondary | ICD-10-CM | POA: Diagnosis not present

## 2019-08-15 DIAGNOSIS — J189 Pneumonia, unspecified organism: Secondary | ICD-10-CM | POA: Diagnosis not present

## 2019-08-15 DIAGNOSIS — Z91040 Latex allergy status: Secondary | ICD-10-CM | POA: Diagnosis not present

## 2019-08-15 DIAGNOSIS — R531 Weakness: Secondary | ICD-10-CM | POA: Diagnosis not present

## 2019-08-15 DIAGNOSIS — Z7984 Long term (current) use of oral hypoglycemic drugs: Secondary | ICD-10-CM | POA: Diagnosis not present

## 2019-08-15 DIAGNOSIS — L602 Onychogryphosis: Secondary | ICD-10-CM | POA: Diagnosis not present

## 2019-08-15 DIAGNOSIS — M810 Age-related osteoporosis without current pathological fracture: Secondary | ICD-10-CM | POA: Diagnosis not present

## 2019-08-15 DIAGNOSIS — Z949 Transplanted organ and tissue status, unspecified: Secondary | ICD-10-CM | POA: Diagnosis not present

## 2019-08-15 LAB — GLUCOSE, CAPILLARY
Glucose-Capillary: 100 mg/dL — ABNORMAL HIGH (ref 70–99)
Glucose-Capillary: 171 mg/dL — ABNORMAL HIGH (ref 70–99)

## 2019-08-15 LAB — PROTIME-INR
INR: 1.2 (ref 0.8–1.2)
Prothrombin Time: 15.3 seconds — ABNORMAL HIGH (ref 11.4–15.2)

## 2019-08-15 MED ORDER — POTASSIUM CHLORIDE CRYS ER 20 MEQ PO TBCR
40.0000 meq | EXTENDED_RELEASE_TABLET | Freq: Once | ORAL | Status: AC
  Administered 2019-08-15: 40 meq via ORAL
  Filled 2019-08-15: qty 2

## 2019-08-15 MED ORDER — ENOXAPARIN SODIUM 80 MG/0.8ML ~~LOC~~ SOLN: 80.0000 mg | Freq: Two times a day (BID) | SUBCUTANEOUS | 0 refills | Status: DC

## 2019-08-15 MED ORDER — WARFARIN SODIUM 5 MG PO TABS: 5.0000 mg | ORAL_TABLET | Freq: Once | ORAL | Status: DC

## 2019-08-15 MED ORDER — ONDANSETRON HCL 4 MG/2ML IJ SOLN
4.0000 mg | Freq: Three times a day (TID) | INTRAMUSCULAR | Status: DC | PRN
  Administered 2019-08-15: 12:00:00 4 mg via INTRAVENOUS
  Filled 2019-08-15: qty 2

## 2019-08-15 NOTE — Progress Notes (Signed)
PT Cancellation Note  Patient Details Name: Connie Ruiz MRN: [id number] DOB: 07-05-1960   Cancelled Treatment:    Reason Eval/Treat Not Completed: Patient declined, no reason specified. First attempted in am, patient declined stated she wanted nausea medication. Returned after lunch and patient continues to decline all/any PT.     Rosalio Catterton 08/15/2019, 2:19 PM

## 2019-08-15 NOTE — Progress Notes (Signed)
[place] for warfarin  Indication: pulmonary embolus, DVT, stroke and APS  Allergies  Allergen Reactions  . Oxycodone-Acetaminophen Shortness Of Breath and Nausea Only  . Propoxyphene Nausea Only and Shortness Of Breath    States takes tylenol at home  . Propoxyphene N-Acetaminophen Shortness Of Breath and Nausea Only  . Sulfonamide Derivatives Shortness Of Breath and Nausea Only  . Codeine Nausea Only  . Hydrocodone-Acetaminophen   . Hydromorphone   . Other Other (See Comments)    No blood, Jehovaeh Witness   . Oxycodone   . Sulfamethoxazole   . Tape     Redness**PAPER TAPE OK**  . Gabapentin Anxiety    twitching  . Latex Rash  . Metoprolol Rash  . Morphine And Related Rash    IV site on arm is red, patient reports this is improving.  NO shortness of breath reported.  . Rosiglitazone Rash    Patient Measurements: Height: 5\' 4"  (162.6 cm) Weight: 183 lb 3.2 oz (83.1 kg) IBW/kg (Calculated) : 54.7 Heparin Dosing Weight: 72.8kg  Vital Signs: Temp: 98.1 F (36.7 C) (03/10 0448) Temp Source: Oral (03/10 0448) BP: 132/96 (03/10 0448) Pulse Rate: 97 (03/10 0448)  Labs: Recent Labs    08/12/19 1103 08/12/19 1103 08/12/19 1106 08/13/19 0607 08/14/19 0322 08/15/19 0426  HGB 14.4   < > 15.3* 12.7  --   --   HCT 44.9  --  45.0 38.7  --   --   PLT 213  --   --  205  --   --   APTT 31  --   --   --   --   --   LABPROT 13.3   < >  --  15.7* 13.6 15.3*  INR 1.0   < >  --  1.3* 1.1 1.2  HEPARINUNFRC  --   --   --  1.74*  --   --   CREATININE 0.86  --  0.60  --   --   --    < > = values in this interval not displayed.    Estimated Creatinine Clearance: 80 mL/min (by C-G formula based on SCr of 0.6 mg/dL).  Assessment: 53 yof presented to the ED with slurred speech and worsening right sided weakness. Code Stroke was activated but MRI was negative for an acute stroke. Pt is on chronic warfarin for history VTE, stroke and  antiphospholipid syndrome. INR is subtherapeutic at 1 on admit. Pharmacy consulted to manage warfarin inpatient.  INR today remains SUBtherapeutic though trended up (INR 1.2 << 1.1, goal of 2-3). No CBC today, no bleeding noted.   Goal of Therapy:  INR 2-3 Anti-Xa level 0.6-1 units/ml 4hrs after LMWH dose given Monitor platelets by anticoagulation protocol: Yes   Plan:  - Continue Lovenox 80 mg SQ q12h - Warfarin 5 mg x 1 dose at 1800 today - Daily PT/INR, CBC q72h - Will continue to monitor for any signs/symptoms of bleeding and will follow up with PT/INR in the a.m.    Thank you for allowing pharmacy to be a part of this patient's care.  Alycia Rossetti, PharmD, BCPS Clinical Pharmacist Clinical phone for 08/15/2019: [phone] 08/15/2019 9:55 AM   **Pharmacist phone directory can now be found on [email or web address] ([email or web address]).  Listed under [place].

## 2019-08-15 NOTE — TOC Transition Note (Signed)
Transition of Care [facility]) - CM/SW Discharge Note   Patient Details  Name: Connie Ruiz MRN: [id number] Date of Birth: 08/24/1960  Transition of Care [facility]) CM/SW Contact:  Geralynn Ochs, LCSW Phone Number: 08/15/2019, 12:17 PM   Clinical Narrative:   Nurse to call report to [phone], Ask for [facility].   Transport set for 2:00 PM, per patient request; she just ordered lunch and would like to eat before she leaves.    Final next level of care: Skilled Nursing Facility Barriers to Discharge: Barriers Resolved   Patient Goals and CMS Choice        Discharge Placement              Patient chooses bed at: [name] Patient to be transferred to facility by: [place] Name of family member notified: [place] Patient and family notified of of transfer: 08/15/19  Discharge Plan and Services                  DME Agency: NA                  Social Determinants of Health ([place]) Interventions     Readmission Risk Interventions No flowsheet data found.

## 2019-08-15 NOTE — Patient Outreach (Signed)
[facility]) Care Management  08/15/2019  Connie Ruiz 1961/05/22 [id number]   RN Health Coach Case Closure  Referral Date: 11/28/2018 Referral Source: [facility] High Risk Screening Reason for Referral: Disease Management Education Insurance: [employer]   Outreach Attempt:  Patient to be discharged from hospital to [facility] for skilled nursing and rehabilitation per [facility] Liaison.  Plan:  RN Health Coach will send primary care Case Closure letter.  RN Health Coach will send patient Case Closure letter.  RN Health Coach will close case and make patient inactive as she is participating in another program/skilled nursing.  [place] [phone] [email or web address]

## 2019-08-15 NOTE — Progress Notes (Signed)
Discharged to [facility] after IV accesses removed and report given to nurse [place].  Transported by [facility].

## 2019-08-15 NOTE — Discharge Summary (Signed)
Physician Discharge Summary  JACINDA KANADY ESL:[id number] DOB: 1960/08/19 DOA: 08/12/2019  PCP: [name], [name], MD  Admit date: 08/12/2019 Discharge date: 08/15/2019  Admitted From: Home Disposition:  SNF  Recommendations for Outpatient Follow-up:  1. Follow up with PCP in 1-2 weeks 2. Continue both coumadin and Lovenox until INR theraptutic at 2-3, then stop lovenox  Discharge Condition:Stable CODE STATUS:Full Diet recommendation: Diabetic   Brief/Interim Summary: 59 year old woman admitted from home on 3/7 with complaints of slurred speech and worsening right-sided weakness.  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  Patient has a history of an old left MCA stroke with residual right-sided weakness.  Day of admission at around 830 she had increased right-sided weakness and slurred speech.  Admission was requested for further evaluation and management.  Discharge Diagnoses:  Active Problems:   Essential hypertension   Chronic diastolic congestive heart failure (HCC)   History of stroke with residual effects   Right sided weakness   Hypokalemia   Type 2 diabetes mellitus (HCC)   SLE (systemic lupus erythematosus related syndrome) (HCC)   Hypothyroidism   HLD (hyperlipidemia)   Right-sided weakness and slurred speech -MRI was negative for acute infarction, mass or hemorrhage. -She was initially admitted as a code stroke, after negative MRI and evaluation by neurology it was canceled.  Neurology is suspecting recrudescence of old stroke symptoms. -EEG without signs of acute seizure activity but indicative of cortical dysfunction of the left frontotemporal region likely secondary to prior  infarct. -No further work-up is indicated by neurology team. - PT/OT consultations are recommending SNF.  TOC team is aware. -She remains on Coumadin, however because she is subtherapeutic is also on therapeutic Lovenox dosing. Please continue lovenox until INR becomes therapeutic (2-3)  History of DVT/PE -On Coumadin with Lovenox bridge per above  Leukocytosis -Likely due to chronic steroid use, she remains afebrile, chest x-ray and UA are negative for sources of infection. -She is not currently on antibiotic therapy. -Leukocytosis has resolved.  Hypothyroidism -TSH is extremely elevated to 121.8 -Increase Synthroid dose from 175 to 200 mcg. -She will need repeat thyroid function tests in 4 to 6 weeks.  End-stage renal disease status post renal transplant -On chronic immunosuppressive therapy. -Renal function remains at baseline.  Chronic diastolic heart failure -Appears compensated and euvolemic.  Hyperlipidemia -Continue statin  Hypertension -Continue home medications -Blood pressure remains slightly above goal, do not anticipate medication changes while hospitalized.  Type 2 diabetes -Holding Metformin, continue SSI. -A1c is 7.3. -Fair control while hospitalized.  Anxiety, depression, bipolar disorder -Continue Zoloft, Seroquel, Remeron, Aricept   Discharge Instructions   Allergies as of 08/15/2019      Reactions   Oxycodone-acetaminophen Shortness Of Breath, Nausea Only   Propoxyphene Nausea Only, Shortness Of Breath   States takes tylenol at home   Propoxyphene N-acetaminophen Shortness Of Breath, Nausea Only   Sulfonamide Derivatives Shortness Of Breath, Nausea Only   Codeine Nausea Only   Hydrocodone-acetaminophen    Hydromorphone    Other Other (See Comments)   No blood, Jehovaeh Witness    Oxycodone    Sulfamethoxazole    Tape    Redness**PAPER TAPE OK**   Gabapentin Anxiety   twitching   Latex Rash   Metoprolol Rash   Morphine And  Related  Rash   IV site on arm is red, patient reports this is improving.  NO shortness of breath reported.   Rosiglitazone Rash      Medication List    TAKE these medications   Accu-Chek Aviva Plus w/Device Kit 1 Device by Does not apply route daily.   acetaminophen 500 MG tablet Commonly known as: TYLENOL Take 1,000 mg by mouth daily as needed for mild pain.   alendronate 70 MG tablet Commonly known as: FOSAMAX TAKE 1 TABLET BY MOUTH ONCE A WEEK ON AN EMPTY STOMACH ON SATURDAYS WITH A FULL GLASS OF WATER. What changed: See the new instructions.   atorvastatin 40 MG tablet Commonly known as: LIPITOR Take 40 mg by mouth daily at 6 PM.   b complex-vitamin c-folic acid 0.8 MG Tabs tablet Take 1 tablet by mouth daily.   calcitRIOL 0.25 MCG capsule Commonly known as: ROCALTROL TAKE 1 CAPSULE(0.25 MCG) BY MOUTH DAILY. NO REFILLS WITHOUT APPOINTMENT What changed:   how much to take  how to take this  when to take this   diltiazem 360 MG 24 hr capsule Commonly known as: TIAZAC Take 1 capsule (360 mg total) by mouth daily.   donepezil 10 MG tablet Commonly known as: ARICEPT Take 10 mg by mouth at bedtime.   enoxaparin 80 MG/0.8ML injection Commonly known as: LOVENOX Inject 0.8 mLs (80 mg total) into the skin every 12 (twelve) hours for 5 days.   esomeprazole 20 MG capsule Commonly known as: NEXIUM TAKE 1 CAPSULE(20 MG) BY MOUTH DAILY What changed: See the new instructions.   feeding supplement (ENSURE ENLIVE) Liqd Take 237 mLs by mouth 2 (two) times daily between meals.   furosemide 20 MG tablet Commonly known as: LASIX Take 20 mg by mouth daily.   glucose blood test strip Commonly known as: ONE TOUCH ULTRA TEST Use to check blood sugar 1 time per day   levothyroxine 175 MCG tablet Commonly known as: SYNTHROID TAKE 1 TABLET(175 MCG) BY MOUTH DAILY BEFORE BREAKFAST What changed:   how much to take  how to take this  when to take this  additional  instructions   losartan 100 MG tablet Commonly known as: COZAAR Take 1 tablet (100 mg total) by mouth daily.   magnesium oxide 400 MG tablet Commonly known as: MAG-OX Take 800 mg by mouth daily.   metFORMIN 500 MG 24 hr tablet Commonly known as: GLUCOPHAGE-XR TAKE 1 TABLET(500 MG) BY MOUTH DAILY WITH BREAKFAST What changed:   how much to take  how to take this  when to take this  additional instructions   mirtazapine 30 MG tablet Commonly known as: REMERON Take 1 tablet (30 mg total) by mouth at bedtime.   mycophenolate 250 MG capsule Commonly known as: CELLCEPT Take 250 mg by mouth 2 (two) times daily.   nystatin powder Commonly known as: MYCOSTATIN/NYSTOP Apply 1 g topically 4 (four) times daily as needed. For yeast under breast   ondansetron 4 MG disintegrating tablet Commonly known as: Zofran ODT 105m ODT q4 hours prn nausea/vomiting What changed:   how much to take  how to take this  when to take this  reasons to take this  additional instructions   OneTouch Delica Lancets 362IMisc Use to check blood sugar 1 time per day   Potassium Chloride ER 20 MEQ Tbcr Take 1 tablet by mouth daily. NEEDS POTASSIUM RE-CHECK.   predniSONE 5 MG tablet Commonly known as: DELTASONE Take 5 mg by mouth daily with  breakfast.   QUEtiapine 25 MG tablet Commonly known as: SEROquel Take 1 tablet (25 mg total) by mouth at bedtime.   sertraline 100 MG tablet Commonly known as: ZOLOFT Take 0.5 tablets (50 mg total) by mouth daily. What changed:   when to take this  reasons to take this   sucralfate 1 g tablet Commonly known as: Carafate Take 1 tablet (1 g total) by mouth 4 (four) times daily for 30 days.   tacrolimus 1 MG capsule Commonly known as: PROGRAF Take 1-2 mg by mouth See admin instructions. 2 mg in the am and 1 mg at night   warfarin 5 MG tablet Commonly known as: COUMADIN Take as directed. If you are unsure how to take this medication, talk to  your nurse or doctor. Original instructions: TAKE 1/2 TO 1 TABLET BY MOUTH ON MONDAY THROUGH FRIDAY; TUESDAY, WEDNESDAY, THURSDAY, SATAND SUNDAY-2.5MG What changed: See the new instructions.      Contact information for after-discharge care    Destination    [facility] .   Service: Skilled Nursing Contact information: [address] [place] [phone]             Allergies  Allergen Reactions  . Oxycodone-Acetaminophen Shortness Of Breath and Nausea Only  . Propoxyphene Nausea Only and Shortness Of Breath    States takes tylenol at home  . Propoxyphene N-Acetaminophen Shortness Of Breath and Nausea Only  . Sulfonamide Derivatives Shortness Of Breath and Nausea Only  . Codeine Nausea Only  . Hydrocodone-Acetaminophen   . Hydromorphone   . Other Other (See Comments)    No blood, Jehovaeh Witness   . Oxycodone   . Sulfamethoxazole   . Tape     Redness**PAPER TAPE OK**  . Gabapentin Anxiety    twitching  . Latex Rash  . Metoprolol Rash  . Morphine And Related Rash    IV site on arm is red, patient reports this is improving.  NO shortness of breath reported.  . Rosiglitazone Rash    Consultations:  Neurology  Procedures/Studies: EEG  Result Date: 08/13/2019 Lora Havens, MD     08/13/2019 10:43 AM Patient Name: LAROSE BATRES MRN: [id number] Epilepsy Attending: Lora Havens Referring Physician/Provider: Dr. Karena Addison Aroor Date: 08/13/2019 Duration: 24.29 minutes Patient history: 59 year old female with history of left MCA stroke who presented with worsening right-sided weakness and speech disturbance.  EEG to evaluate for seizures. Level of alertness: Awake AEDs during EEG study: None Technical aspects: This EEG study was done with scalp electrodes positioned according to the 10-20 International system of electrode placement. Electrical activity was acquired at a sampling rate of 500Hz and reviewed with a high  frequency filter of 70Hz and a low frequency filter of 1Hz. EEG data were recorded continuously and digitally stored. Description: During awake state, the posterior dominant rhythm consists of 8 Hz activity of moderate voltage (25-35 uV) seen predominantly in posterior head regions, symmetric and reactive to eye opening and eye closing.  EEG showed continuous 5 to 7 Hz theta slowing in left centro- temporal region.  Sharp transients were seen in left frontotemporal region.  Physiologic photic driving was seen during photic stimulation.  Hyperventilation was not performed. Abnormality -Continuous slow, left centro- temporal region IMPRESSION: This study is suggestive of cortical dysfunction in the left frontotemporal region likely secondary to underlying infarct. No seizures or definite epileptiform discharges were seen throughout the recording. Lora Havens   DG Chest 2  View  Result Date: 08/12/2019 CLINICAL DATA:  Cough. EXAM: CHEST - 2 VIEW COMPARISON:  08/14/2018 FINDINGS: Heart size is at the upper limits of normal and stable. Aortic atherosclerosis incidentally noted. Both lungs are clear. Surgical clips again seen in lower neck and superior mediastinum. IMPRESSION: No active cardiopulmonary disease. Electronically Signed   By: Marlaine Hind M.D.   On: 08/12/2019 14:19   MR BRAIN WO CONTRAST  Result Date: 08/12/2019 CLINICAL DATA:  Code stroke follow-up EXAM: MRI HEAD WITHOUT CONTRAST TECHNIQUE: Multiplanar, multiecho pulse sequences of the brain and surrounding structures were obtained without intravenous contrast. COMPARISON:  2015 FINDINGS: Motion artifact is present. Brain: There is no acute infarction or intracranial hemorrhage. There is no intracranial mass, mass effect, or edema. There is no hydrocephalus or extra-axial fluid collection. Chronic left MCA territory infarction is present involving the frontoparietal lobes, insula, and basal ganglia. There is ex vacuo dilatation of the left lateral  ventricle. Chronic blood products are present in association with the basal ganglia portion. There are additional multiple chronic small cerebellar infarcts. Additional patchy T2 hyperintensity in the supratentorial white matter is nonspecific but probably reflects mild chronic microvascular ischemic changes. Vascular: Major vessel flow voids at the skull base are preserved. Skull and upper cervical spine: Normal marrow signal is preserved. Sinuses/Orbits: Paranasal sinuses are aerated. Orbits are unremarkable. Other: Sella is unremarkable.  Mastoid air cells are clear. IMPRESSION: No acute infarction, hemorrhage, or mass. Chronic left MCA territory infarction.  Chronic cerebellar infarcts. Electronically Signed   By: Macy Mis M.D.   On: 08/12/2019 12:46   CT HEAD CODE STROKE WO CONTRAST  Result Date: 08/12/2019 CLINICAL DATA:  Code stroke.  Right-sided weakness EXAM: CT HEAD WITHOUT CONTRAST TECHNIQUE: Contiguous axial images were obtained from the base of the skull through the vertex without intravenous contrast. COMPARISON:  2016 FINDINGS: Brain: There is no acute intracranial hemorrhage, mass effect, or edema. Chronic left MCA territory infarction is again identified primarily involving frontoparietal lobes, insula, and basal ganglia with ex vacuo dilatation of the adjacent left lateral ventricle. Multiple chronic cerebellar infarcts are also seen. There is no acute appearing loss of gray differentiation. Additional patchy and confluent hypoattenuation in the supratentorial white matter is nonspecific but probably reflects moderate chronic microvascular ischemic changes. There is no hydrocephalus. Vascular: No hyperdense vessel. Intracranial atherosclerotic calcification is present at the skull base. Skull: Unremarkable. Sinuses/Orbits: No acute abnormality. Other: Mastoid air cells are clear. ASPECTS ([place] Stroke Program Early CT Score) - Ganglionic level infarction (caudate, lentiform nuclei,  internal capsule, insula, M1-M3 cortex): 7 - Supraganglionic infarction (M4-M6 cortex): 3 Total score (0-10 with 10 being normal): 10 IMPRESSION: No acute intracranial hemorrhage or evidence of acute infarction. ASPECT score is 10 on the right. Chronic left MCA territory infarction. Chronic cerebellar infarcts. Moderate chronic microvascular ischemic changes. These results were communicated to Dr. Lorraine Lax at 11:16 amon 3/7/2021by text page via the [facility] messaging system. Electronically Signed   By: Macy Mis M.D.   On: 08/12/2019 11:20    Subjective: Feeling somewhat nauseated, but eager to start rehab  Discharge Exam: Vitals:   08/15/19 0010 08/15/19 0448  BP: 118/81 (!) 132/96  Pulse: 91 97  Resp: 16 20  Temp: 98 F (36.7 C) 98.1 F (36.7 C)  SpO2: 96% 100%   Vitals:   08/14/19 1619 08/14/19 2056 08/15/19 0010 08/15/19 0448  BP: 103/68 115/77 118/81 (!) 132/96  Pulse: 90 77 91 97  Resp: _0 Temp:  98.1 F (36.7 C) 98 F (36.7 C) 98 F (36.7 C) 98.1 F (36.7 C)  TempSrc: Oral Oral Oral Oral  SpO2: 97% 99% 96% 100%  Weight:      Height:        General: Pt is alert, awake, not in acute distress Cardiovascular: RRR, S1/S2 +, no rubs, no gallops Respiratory: CTA bilaterally, no wheezing, no rhonchi Abdominal: Soft, NT, ND, bowel sounds + Extremities: no edema, no cyanosis   The results of significant diagnostics from this hospitalization (including imaging, microbiology, ancillary and laboratory) are listed below for reference.     Microbiology: Recent Results (from the past 240 hour(s))  SARS CORONAVIRUS 2 (TAT 6-24 HRS) Nasopharyngeal Nasopharyngeal Swab     Status: None   Collection Time: 08/12/19  4:48 PM   Specimen: Nasopharyngeal Swab  Result Value Ref Range Status   SARS Coronavirus 2 NEGATIVE NEGATIVE Final    Comment: (NOTE) SARS-CoV-2 target nucleic acids are NOT DETECTED. The SARS-CoV-2 RNA is generally detectable in upper and lower respiratory  specimens during the acute phase of infection. Negative results do not preclude SARS-CoV-2 infection, do not rule out co-infections with other pathogens, and should not be used as the sole basis for treatment or other patient management decisions. Negative results must be combined with clinical observations, patient history, and epidemiological information. The expected result is Negative. Fact Sheet for Patients: SugarRoll.be Fact Sheet for Healthcare Providers: https://www.woods-mathews.com/ This test is not yet approved or cleared by the [place] FDA and  has been authorized for detection and/or diagnosis of SARS-CoV-2 by FDA under an Emergency Use Authorization (EUA). This EUA will remain  in effect (meaning this test can be used) for the duration of the COVID-19 declaration under Section 56 4(b)(1) of the Act, 21 U.S.C. section 360bbb-3(b)(1), unless the authorization is terminated or revoked sooner. Performed at [facility], [place] [address] [place], [place] [address]      Labs: BNP (last 3 results) No results for input(s): BNP in the last 8760 hours. Basic Metabolic Panel: Recent Labs  Lab 08/12/19 1103 08/12/19 1106  NA 142 139  K 3.4* 3.4*  CL 107 106  CO2 22  --   GLUCOSE 146* 141*  BUN 12 13  CREATININE 0.86 0.60  CALCIUM 9.6  --    Liver Function Tests: Recent Labs  Lab 08/12/19 1103  AST 18  ALT 14  ALKPHOS 72  BILITOT 0.8  PROT 7.3  ALBUMIN 3.9   No results for input(s): LIPASE, AMYLASE in the last 168 hours. No results for input(s): AMMONIA in the last 168 hours. CBC: Recent Labs  Lab 08/12/19 1103 08/12/19 1106 08/13/19 0607  WBC 11.2*  --  8.4  NEUTROABS 6.1  --   --   HGB 14.4 15.3* 12.7  HCT 44.9 45.0 38.7  MCV 93.9  --  91.7  PLT 213  --  205   Cardiac Enzymes: No results for input(s): CKTOTAL, CKMB, CKMBINDEX, TROPONINI in the last 168 hours. BNP: Invalid input(s):  POCBNP CBG: Recent Labs  Lab 08/14/19 0843 08/14/19 1300 08/14/19 1627 08/14/19 2100 08/15/19 0606  GLUCAP 119* 211* 156* 151* 100*   D-Dimer No results for input(s): DDIMER in the last 72 hours. Hgb A1c Recent Labs    08/12/19 1103  HGBA1C 7.3*   Lipid Profile Recent Labs    08/13/19 0607  CHOL 305*  HDL 89  LDLCALC 202*  TRIG 70  CHOLHDL 3.4   Thyroid function studies  Recent Labs    08/13/19 0607  TSH 121.857*   Anemia work up No results for input(s): VITAMINB12, FOLATE, FERRITIN, TIBC, IRON, RETICCTPCT in the last 72 hours. Urinalysis    Component Value Date/Time   COLORURINE YELLOW 08/12/2019 1446   APPEARANCEUR CLOUDY (A) 08/12/2019 1446   LABSPEC 1.013 08/12/2019 1446   PHURINE 8.0 08/12/2019 1446   GLUCOSEU NEGATIVE 08/12/2019 1446   GLUCOSEU NEGATIVE 10/22/2013 1621   HGBUR NEGATIVE 08/12/2019 1446   BILIRUBINUR NEGATIVE 08/12/2019 1446   KETONESUR NEGATIVE 08/12/2019 1446   PROTEINUR NEGATIVE 08/12/2019 1446   UROBILINOGEN 1.0 03/27/2015 1534   NITRITE NEGATIVE 08/12/2019 1446   LEUKOCYTESUR NEGATIVE 08/12/2019 1446   Sepsis Labs Invalid input(s): PROCALCITONIN,  WBC,  LACTICIDVEN Microbiology Recent Results (from the past 240 hour(s))  SARS CORONAVIRUS 2 (TAT 6-24 HRS) Nasopharyngeal Nasopharyngeal Swab     Status: None   Collection Time: 08/12/19  4:48 PM   Specimen: Nasopharyngeal Swab  Result Value Ref Range Status   SARS Coronavirus 2 NEGATIVE NEGATIVE Final    Comment: (NOTE) SARS-CoV-2 target nucleic acids are NOT DETECTED. The SARS-CoV-2 RNA is generally detectable in upper and lower respiratory specimens during the acute phase of infection. Negative results do not preclude SARS-CoV-2 infection, do not rule out co-infections with other pathogens, and should not be used as the sole basis for treatment or other patient management decisions. Negative results must be combined with clinical observations, patient history, and  epidemiological information. The expected result is Negative. Fact Sheet for Patients: SugarRoll.be Fact Sheet for Healthcare Providers: https://www.woods-mathews.com/ This test is not yet approved or cleared by the [place] FDA and  has been authorized for detection and/or diagnosis of SARS-CoV-2 by FDA under an Emergency Use Authorization (EUA). This EUA will remain  in effect (meaning this test can be used) for the duration of the COVID-19 declaration under Section 56 4(b)(1) of the Act, 21 U.S.C. section 360bbb-3(b)(1), unless the authorization is terminated or revoked sooner. Performed at [facility], [place] [address] [place], [place] [address]    Time spent: 73mn  SIGNED:   SMarylu Lund MD  [facility] 08/15/2019, 10:59 AM  If 7PM-7AM, please contact night-coverage

## 2019-08-16 DIAGNOSIS — R531 Weakness: Secondary | ICD-10-CM | POA: Diagnosis not present

## 2019-08-16 DIAGNOSIS — Z940 Kidney transplant status: Secondary | ICD-10-CM | POA: Diagnosis not present

## 2019-08-16 DIAGNOSIS — E039 Hypothyroidism, unspecified: Secondary | ICD-10-CM | POA: Diagnosis not present

## 2019-08-16 DIAGNOSIS — E119 Type 2 diabetes mellitus without complications: Secondary | ICD-10-CM | POA: Diagnosis not present

## 2019-08-17 DIAGNOSIS — R109 Unspecified abdominal pain: Secondary | ICD-10-CM | POA: Diagnosis not present

## 2019-08-17 DIAGNOSIS — R531 Weakness: Secondary | ICD-10-CM | POA: Diagnosis not present

## 2019-08-17 DIAGNOSIS — R5381 Other malaise: Secondary | ICD-10-CM | POA: Diagnosis not present

## 2019-08-17 DIAGNOSIS — I110 Hypertensive heart disease with heart failure: Secondary | ICD-10-CM | POA: Diagnosis not present

## 2019-08-20 DIAGNOSIS — Z7901 Long term (current) use of anticoagulants: Secondary | ICD-10-CM | POA: Diagnosis not present

## 2019-08-20 DIAGNOSIS — R110 Nausea: Secondary | ICD-10-CM | POA: Diagnosis not present

## 2019-08-20 DIAGNOSIS — R109 Unspecified abdominal pain: Secondary | ICD-10-CM | POA: Diagnosis not present

## 2019-08-20 DIAGNOSIS — Z5181 Encounter for therapeutic drug level monitoring: Secondary | ICD-10-CM | POA: Diagnosis not present

## 2019-08-22 DIAGNOSIS — L602 Onychogryphosis: Secondary | ICD-10-CM | POA: Diagnosis not present

## 2019-08-22 DIAGNOSIS — Z5181 Encounter for therapeutic drug level monitoring: Secondary | ICD-10-CM | POA: Diagnosis not present

## 2019-08-22 DIAGNOSIS — R110 Nausea: Secondary | ICD-10-CM | POA: Diagnosis not present

## 2019-08-22 DIAGNOSIS — R109 Unspecified abdominal pain: Secondary | ICD-10-CM | POA: Diagnosis not present

## 2019-08-24 DIAGNOSIS — Z5181 Encounter for therapeutic drug level monitoring: Secondary | ICD-10-CM | POA: Diagnosis not present

## 2019-08-24 DIAGNOSIS — I10 Essential (primary) hypertension: Secondary | ICD-10-CM | POA: Diagnosis not present

## 2019-08-27 DIAGNOSIS — Z5181 Encounter for therapeutic drug level monitoring: Secondary | ICD-10-CM | POA: Diagnosis not present

## 2019-08-27 DIAGNOSIS — I10 Essential (primary) hypertension: Secondary | ICD-10-CM | POA: Diagnosis not present

## 2019-08-28 DIAGNOSIS — Z5181 Encounter for therapeutic drug level monitoring: Secondary | ICD-10-CM | POA: Diagnosis not present

## 2019-08-28 DIAGNOSIS — Z7901 Long term (current) use of anticoagulants: Secondary | ICD-10-CM | POA: Diagnosis not present

## 2019-08-31 DIAGNOSIS — Z5181 Encounter for therapeutic drug level monitoring: Secondary | ICD-10-CM | POA: Diagnosis not present

## 2019-08-31 DIAGNOSIS — Z7901 Long term (current) use of anticoagulants: Secondary | ICD-10-CM | POA: Diagnosis not present

## 2019-08-31 DIAGNOSIS — I110 Hypertensive heart disease with heart failure: Secondary | ICD-10-CM | POA: Diagnosis not present

## 2019-09-04 DIAGNOSIS — Z5181 Encounter for therapeutic drug level monitoring: Secondary | ICD-10-CM | POA: Diagnosis not present

## 2019-09-04 DIAGNOSIS — Z7901 Long term (current) use of anticoagulants: Secondary | ICD-10-CM | POA: Diagnosis not present

## 2019-09-07 DIAGNOSIS — I110 Hypertensive heart disease with heart failure: Secondary | ICD-10-CM | POA: Diagnosis not present

## 2019-09-07 DIAGNOSIS — Z5181 Encounter for therapeutic drug level monitoring: Secondary | ICD-10-CM | POA: Diagnosis not present

## 2019-09-07 DIAGNOSIS — Z7901 Long term (current) use of anticoagulants: Secondary | ICD-10-CM | POA: Diagnosis not present

## 2019-09-10 ENCOUNTER — Other Ambulatory Visit: Payer: Self-pay

## 2019-09-10 ENCOUNTER — Emergency Department (HOSPITAL_COMMUNITY): Payer: Medicare Other

## 2019-09-10 ENCOUNTER — Ambulatory Visit (INDEPENDENT_AMBULATORY_CARE_PROVIDER_SITE_OTHER): Payer: Medicare Other | Admitting: Podiatrist

## 2019-09-10 ENCOUNTER — Encounter: Payer: Self-pay | Admitting: Podiatrist

## 2019-09-10 ENCOUNTER — Encounter (HOSPITAL_COMMUNITY): Payer: Self-pay

## 2019-09-10 ENCOUNTER — Emergency Department (HOSPITAL_COMMUNITY)
Admission: EM | Admit: 2019-09-10 | Discharge: 2019-09-11 | Disposition: A | Discharge status: Home / Self Care | Payer: Medicare Other | Attending: Emergency Medicine | Admitting: Emergency Medicine

## 2019-09-10 DIAGNOSIS — I10 Essential (primary) hypertension: Secondary | ICD-10-CM

## 2019-09-10 DIAGNOSIS — Z7901 Long term (current) use of anticoagulants: Secondary | ICD-10-CM | POA: Diagnosis not present

## 2019-09-10 DIAGNOSIS — M79676 Pain in unspecified toe(s): Secondary | ICD-10-CM | POA: Diagnosis not present

## 2019-09-10 DIAGNOSIS — Z79899 Other long term (current) drug therapy: Secondary | ICD-10-CM | POA: Diagnosis not present

## 2019-09-10 DIAGNOSIS — J189 Pneumonia, unspecified organism: Secondary | ICD-10-CM | POA: Diagnosis not present

## 2019-09-10 DIAGNOSIS — B351 Tinea unguium: Secondary | ICD-10-CM

## 2019-09-10 DIAGNOSIS — R519 Headache, unspecified: Secondary | ICD-10-CM

## 2019-09-10 DIAGNOSIS — E039 Hypothyroidism, unspecified: Secondary | ICD-10-CM | POA: Insufficient documentation

## 2019-09-10 DIAGNOSIS — I130 Hypertensive heart and chronic kidney disease with heart failure and stage 1 through stage 4 chronic kidney disease, or unspecified chronic kidney disease: Secondary | ICD-10-CM | POA: Insufficient documentation

## 2019-09-10 DIAGNOSIS — R531 Weakness: Secondary | ICD-10-CM | POA: Diagnosis not present

## 2019-09-10 DIAGNOSIS — Z91040 Latex allergy status: Secondary | ICD-10-CM | POA: Insufficient documentation

## 2019-09-10 DIAGNOSIS — I5032 Chronic diastolic (congestive) heart failure: Secondary | ICD-10-CM | POA: Insufficient documentation

## 2019-09-10 DIAGNOSIS — I110 Hypertensive heart disease with heart failure: Secondary | ICD-10-CM | POA: Diagnosis not present

## 2019-09-10 DIAGNOSIS — E1159 Type 2 diabetes mellitus with other circulatory complications: Secondary | ICD-10-CM | POA: Diagnosis not present

## 2019-09-10 DIAGNOSIS — Z8673 Personal history of transient ischemic attack (TIA), and cerebral infarction without residual deficits: Secondary | ICD-10-CM | POA: Diagnosis not present

## 2019-09-10 DIAGNOSIS — R42 Dizziness and giddiness: Secondary | ICD-10-CM | POA: Diagnosis not present

## 2019-09-10 DIAGNOSIS — N189 Chronic kidney disease, unspecified: Secondary | ICD-10-CM | POA: Insufficient documentation

## 2019-09-10 DIAGNOSIS — Z7984 Long term (current) use of oral hypoglycemic drugs: Secondary | ICD-10-CM | POA: Insufficient documentation

## 2019-09-10 DIAGNOSIS — I6930 Unspecified sequelae of cerebral infarction: Secondary | ICD-10-CM

## 2019-09-10 DIAGNOSIS — Z20822 Contact with and (suspected) exposure to covid-19: Secondary | ICD-10-CM | POA: Insufficient documentation

## 2019-09-10 DIAGNOSIS — R5381 Other malaise: Secondary | ICD-10-CM | POA: Diagnosis not present

## 2019-09-10 DIAGNOSIS — E1122 Type 2 diabetes mellitus with diabetic chronic kidney disease: Secondary | ICD-10-CM | POA: Insufficient documentation

## 2019-09-10 LAB — CBC
HCT: 37.5 % (ref 36.0–46.0)
Hemoglobin: 11.6 g/dL — ABNORMAL LOW (ref 12.0–15.0)
MCH: 30.1 pg (ref 26.0–34.0)
MCHC: 30.9 g/dL (ref 30.0–36.0)
MCV: 97.4 fL (ref 80.0–100.0)
Platelets: 295 10*3/uL (ref 150–400)
RBC: 3.85 MIL/uL — ABNORMAL LOW (ref 3.87–5.11)
RDW: 15.3 % (ref 11.5–15.5)
WBC: 11.3 10*3/uL — ABNORMAL HIGH (ref 4.0–10.5)
nRBC: 0 % (ref 0.0–0.2)

## 2019-09-10 LAB — CBG MONITORING, ED: Glucose-Capillary: 147 mg/dL — ABNORMAL HIGH (ref 70–99)

## 2019-09-10 LAB — COMPREHENSIVE METABOLIC PANEL
ALT: 19 U/L (ref 0–44)
AST: 23 U/L (ref 15–41)
Albumin: 3.2 g/dL — ABNORMAL LOW (ref 3.5–5.0)
Alkaline Phosphatase: 66 U/L (ref 38–126)
Anion gap: 8 (ref 5–15)
BUN: 16 mg/dL (ref 6–20)
CO2: 25 mmol/L (ref 22–32)
Calcium: 8.8 mg/dL — ABNORMAL LOW (ref 8.9–10.3)
Chloride: 110 mmol/L (ref 98–111)
Creatinine, Ser: 0.7 mg/dL (ref 0.44–1.00)
GFR calc Af Amer: >60 mL/min (ref >60)
GFR calc non Af Amer: >60 mL/min (ref >60)
Glucose, Bld: 154 mg/dL — ABNORMAL HIGH (ref 70–99)
Potassium: 3.6 mmol/L (ref 3.5–5.1)
Sodium: 143 mmol/L (ref 135–145)
Total Bilirubin: 0.6 mg/dL (ref 0.3–1.2)
Total Protein: 6.5 g/dL (ref 6.5–8.1)

## 2019-09-10 LAB — URINALYSIS, ROUTINE W REFLEX MICROSCOPIC
Bilirubin Urine: NEGATIVE
Glucose, UA: NEGATIVE mg/dL
Hgb urine dipstick: NEGATIVE
Ketones, ur: NEGATIVE mg/dL
Leukocytes,Ua: NEGATIVE
Nitrite: NEGATIVE
Protein, ur: NEGATIVE mg/dL
Specific Gravity, Urine: 1.028 (ref 1.005–1.030)
pH: 6 (ref 5.0–8.0)

## 2019-09-10 LAB — PROTIME-INR
INR: 1.9 — ABNORMAL HIGH (ref 0.8–1.2)
Prothrombin Time: 21.3 seconds — ABNORMAL HIGH (ref 11.4–15.2)

## 2019-09-10 LAB — MAGNESIUM: Magnesium: 1.6 mg/dL — ABNORMAL LOW (ref 1.7–2.4)

## 2019-09-10 LAB — TROPONIN I (HIGH SENSITIVITY): Troponin I (High Sensitivity): 10 ng/L (ref <18)

## 2019-09-10 MED ORDER — MAGNESIUM OXIDE 400 (241.3 MG) MG PO TABS
400.0000 mg | ORAL_TABLET | Freq: Once | ORAL | Status: AC
  Administered 2019-09-10: 23:00:00 400 mg via ORAL
  Filled 2019-09-10: qty 1

## 2019-09-10 MED ORDER — ACETAMINOPHEN 325 MG PO TABS
650.0000 mg | ORAL_TABLET | Freq: Once | ORAL | Status: AC
  Administered 2019-09-10: 650 mg via ORAL
  Filled 2019-09-10: qty 2

## 2019-09-10 NOTE — ED Triage Notes (Signed)
Pt BIB [facility] from with Weakness and Dizziness. Pt was at [facility] for rehab for a month for a lupus flareup. Discharged today and while pt was ambulating, she became dizzy and weak.   No fall or LOC  Hx of CVA (r. Sided deficit). Hx of Dialysis (R arm restricted)  Pt A&Ox4

## 2019-09-10 NOTE — ED Notes (Signed)
This RN spoke with family regarding the social work situation; both the daughter and pt are in agreement with sending her home by [facility] until [facility] can be established

## 2019-09-10 NOTE — ED Notes (Signed)
Pt is refusing blood draws at this time; this RN made the EDP aware. Awaiting further orders.

## 2019-09-10 NOTE — ED Notes (Signed)
[facility] called for pt 

## 2019-09-10 NOTE — ED Notes (Signed)
Per Family; Daughter believes she is not in a position to care for this pt appropriately. EDP made aware.

## 2019-09-10 NOTE — ED Provider Notes (Addendum)
[facility] EMERGENCY DEPARTMENT Provider Note   CSN: [id number] Arrival date & time: 09/10/19  1943     History Chief Complaint  Patient presents with  . Weakness  . Dizziness    Female Connie Ruiz is a 59 y.o. female.  Patient Connie/o general weakness and lightheadedness for the past 1-2 days. Symptoms acute onset, moderate, persistent, worse since this AM. Lightheadedness worse when upright. No room spinning sensation. No focal or unilateral numbness/weakness, indicates residual right weakness from prior CVA. No change in vision or speech. No syncope, trauma or fall. No fever or chills. Mild right headache, dull, Connie/w prior. No acute, abrupt or severe headaches. No neck pain or stiffness. No sore throat, cough or uri symptoms. No chest pain or sob. No abd pain or nvd. No gu Connie/o. Pt denies new meds or change in meds, indicates is taking normal home meds. No recent blood loss, rectal bleeding, melena, or vaginal bleeding.   The history is provided by the patient.  Weakness Associated symptoms: dizziness   Associated symptoms: no abdominal pain, no chest pain, no cough, no diarrhea, no dysuria, no fever, no headaches, no shortness of breath and no vomiting   Dizziness Associated symptoms: weakness   Associated symptoms: no blood in stool, no chest pain, no diarrhea, no headaches, no palpitations, no shortness of breath and no vomiting        Past Medical History:  Diagnosis Date  . Anxiety   . Candida esophagitis ([place]) 11/12/2014  . CEREBROVASCULAR ACCIDENT, ACUTE 04/15/2010  . CLOSTRIDIUM DIFFICILE COLITIS, HX OF 08/21/2007  . CONGESTIVE HEART FAILURE 08/21/2007  . Current use of long term anticoagulation    Dr. Andree Elk, [facility]  . CVA 04/17/2010  . Depression    Dr. Andree Elk, [place], TYPE II 08/21/2007  . DVT, HX OF 08/21/2007  . GERD 08/21/2007  . GOUT 08/21/2007  . History of stroke with residual effects   . HYPERLIPIDEMIA 08/21/2007  . HYPERTENSION  08/21/2007   Dr. Andree Elk, [place], HX OF 08/22/2007   s/p renal transplant-Dr. Andree Elk, [place] 08/21/2007  . OSTEOPOROSIS 08/21/2007   Rheumatol at [facility]  . Pulmonary embolism ([place]) 07/16/2010  . Renal failure   . RENAL INSUFFICIENCY 08/21/2007  . Right sided weakness   . Steroid-induced hyperglycemia 11/09/2014  . Tachycardia   . THYROID NODULE, LEFT 04/10/2009    Patient Active Problem List   Diagnosis Date Noted  . 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 Hypothyroidism 01/14/2018  . HLD (hyperlipidemia) 01/14/2018  . CVA (cerebral vascular accident) ([place]) 01/12/2018  . Chest pain 01/11/2018  . Acute respiratory failure with hypoxia ([place]) 07/20/2017  . Abdominal bloating 07/20/2017  . Sinus tachycardia 07/20/2017  . SLE (systemic lupus erythematosus related syndrome) ([place]) 07/20/2017  . CAP (community acquired pneumonia) 07/16/2017  . Long term (current) use of anticoagulants [Z79.01] 06/21/2017  . Screening examination for infectious disease 01/28/2016  . Type 2 diabetes mellitus ([place]) 08/03/2015  . Urinary tract infection 07/20/2015  . Vascular dementia ([place]) 05/08/2015  . Hemiparesis as late effect of cerebrovascular accident (CVA) ([place]) 05/08/2015  . Aphasia as late effect of stroke 05/08/2015  . Connie. difficile colitis 02/25/2015  . Hypokalemia 02/25/2015  . CKD (chronic kidney disease) 02/25/2015  . Colitis 02/14/2015  . Sepsis ([place]) 02/14/2015  . Nausea vomiting and diarrhea 02/14/2015  . Abdominal pain 02/14/2015  . UTI (lower urinary tract infection) 02/14/2015  . Abdominal pain, chronic, generalized 01/07/2015  .  Candida esophagitis ([place]) 11/12/2014  . Abnormal CT of the abdomen   . Steroid-induced hyperglycemia 11/09/2014  . Hypomagnesemia 11/06/2014  . Abdominal pain, acute   . Supratherapeutic INR 11/02/2014  . Jejunitis 11/02/2014  . Myalgia and myositis 04/05/2014  . Wellness examination 04/05/2014  . Menopausal state  04/05/2014  . Palpitations 08/31/2013  . Hyperlipidemia 08/31/2013  . Disorder of heart rhythm 08/13/2013  . TIA (transient ischemic attack) 06/20/2013  . Gout flare: R elbow and R shoulder 06/20/2013  . Acute encephalopathy 06/14/2013  . Rhabdomyolysis 06/14/2013  . History of stroke with residual effects   . Right sided weakness   . Breast mass, left 04/30/2013  . Contusion of knee, left 10/18/2012  . Depression 10/05/2012  . Encounter for long-term (current) use of other medications 10/04/2012  . Multiple thyroid nodules 06/08/2012  . Risk for falls 05/05/2012  . Physical deconditioning 05/05/2012  . Fever 04/27/2012  . DVT (deep venous thrombosis) ([place]) 06/17/2011  . Expressive aphasia 06/17/2011  . Dysphasia 06/11/2011  . Incontinence of urine 06/11/2011  . Hand pain, left 06/11/2011  . Immunosuppressive management encounter following kidney transplant 05-28-11  . Hypertension goal BP (blood pressure) < 130/80 05-28-2011  . Deceased-donor kidney transplant recipient 05-28-2011  . Arm pain, left 05/07/2011  . Dysfunctional voiding of urine 04/28/2011  . Urge incontinence 04/28/2011  . Urinary urgency 04/28/2011  . Routine general medical examination at a health care facility 04/13/2011  . Anticoagulated 01/08/2011  . Pulmonary embolism ([place]) 07/16/2010  . Cerebral artery occlusion with cerebral infarction ([place]) 04/17/2010  . CEREBROVASCULAR ACCIDENT, ACUTE 04/15/2010  . THYROID NODULE, LEFT 04/10/2009  . Cough 03/26/2009  . ACUTE BRONCHITIS 08/22/2007  . KIDNEY TRANSPLANTATION, HX OF 08/22/2007  . Gout 08/21/2007  . Essential hypertension 08/21/2007  . Chronic diastolic congestive heart failure ([place]) 08/21/2007  . GERD 08/21/2007  . Disorder resulting from impaired renal function 08/21/2007  . LUPUS 08/21/2007  . Osteoporosis 08/21/2007  . DVT, HX OF 08/21/2007  . Enteritis due to Clostridium difficile 08/21/2007    Past Surgical History:  Procedure Laterality  Date  . CESAREAN SECTION    . CHOLECYSTECTOMY    . ENTEROSCOPY N/A 11/11/2014   Procedure: ENTEROSCOPY;  Surgeon: Ladene Artist, MD;  Location: [facility] ENDOSCOPY;  Service: Endoscopy;  Laterality: N/A;  . KIDNEY TRANSPLANT Right 2009  . RENAL BIOPSY, OPEN  1981  . TUBAL LIGATION       OB History    Gravida  1   Para  1   Term  1   Preterm      AB      Living  1     SAB      TAB      Ectopic      Multiple      Live Births  1           Family History  Problem Relation Age of Onset  . Heart attack Mother   . Heart disease Father   . Asthma Sister   . Asthma Daughter   . Cancer Maternal Grandfather        prostate  . Cancer Paternal Grandfather        colon    Social History   Tobacco Use  . Smoking status: Never Smoker  . Smokeless tobacco: Never Used  Substance Use Topics  . Alcohol use: No    Alcohol/week: 0.0 standard drinks  . Drug use: No    Home Medications Prior  to Admission medications   Medication Sig Start Date End Date Taking? Authorizing Provider  acetaminophen (TYLENOL) 500 MG tablet Take 1,000 mg by mouth daily as needed for mild pain.    [provider]  alendronate (FOSAMAX) 70 MG tablet TAKE 1 TABLET BY MOUTH ONCE A WEEK ON AN EMPTY STOMACH ON SATURDAYS WITH A FULL GLASS OF WATER. Patient taking differently: Take 70 mg by mouth once a week.  09/17/17   Renato Shin, MD  atorvastatin (LIPITOR) 40 MG tablet Take 40 mg by mouth daily at 6 PM.  07/26/17   [provider]  b complex-vitamin Connie-folic acid (NEPHRO-VITE) 0.8 MG TABS tablet Take 1 tablet by mouth daily. 05/05/12   [provider]  Blood Glucose Monitoring Suppl (ACCU-CHEK AVIVA PLUS) w/Device KIT 1 Device by Does not apply route daily. 10/13/18   [name], [name], MD  calcitRIOL (ROCALTROL) 0.25 MCG capsule TAKE 1 CAPSULE(0.25 MCG) BY MOUTH DAILY. NO REFILLS WITHOUT APPOINTMENT Patient taking differently: Take 0.25 mcg by mouth daily. TAKE 1 CAPSULE(0.25  MCG) BY MOUTH DAILY. NO REFILLS WITHOUT APPOINTMENT 02/13/19   Renato Shin, MD  diltiazem [facility]) 360 MG 24 hr capsule Take 1 capsule (360 mg total) by mouth daily. 06/27/19   [name], [name], MD  donepezil (ARICEPT) 10 MG tablet Take 10 mg by mouth at bedtime.  11/18/16   [provider]  enoxaparin (LOVENOX) 80 MG/0.8ML injection Inject 0.8 mLs (80 mg total) into the skin every 12 (twelve) hours for 5 days. 08/15/19 08/20/19  Donne Hazel, MD  esomeprazole (NEXIUM) 20 MG capsule TAKE 1 CAPSULE(20 MG) BY MOUTH DAILY Patient taking differently: Take 20 mg by mouth daily at 12 noon.  07/17/18   Renato Shin, MD  feeding supplement, ENSURE ENLIVE, (ENSURE ENLIVE) LIQD Take 237 mLs by mouth 2 (two) times daily between meals. 08/22/18   Pokhrel, Corrie Mckusick, MD  furosemide (LASIX) 20 MG tablet Take 20 mg by mouth daily. 04/25/19   [provider]  glucose blood (ONE TOUCH ULTRA TEST) test strip Use to check blood sugar 1 time per day 02/12/15   Renato Shin, MD  levothyroxine (SYNTHROID) 175 MCG tablet TAKE 1 TABLET(175 MCG) BY MOUTH DAILY BEFORE BREAKFAST Patient taking differently: Take 175 mcg by mouth daily before breakfast.  05/15/19   [name], [name], MD  losartan (COZAAR) 100 MG tablet Take 1 tablet (100 mg total) by mouth daily. 01/03/19   [name], [name], MD  magnesium oxide (MAG-OX) 400 MG tablet Take 800 mg by mouth daily.  01/11/17   [provider]  metFORMIN (GLUCOPHAGE-XR) 500 MG 24 hr tablet TAKE 1 TABLET(500 MG) BY MOUTH DAILY WITH BREAKFAST Patient taking differently: Take 500 mg by mouth daily with breakfast.  06/05/19   [name], [name], MD  mirtazapine (REMERON) 30 MG tablet Take 1 tablet (30 mg total) by mouth at bedtime. 07/28/15   Pieter Partridge, DO  mycophenolate (CELLCEPT) 250 MG capsule Take 250 mg by mouth 2 (two) times daily.  07/13/17   [provider]  nystatin (MYCOSTATIN) powder Apply 1 g topically 4 (four) times daily as needed. For yeast under  breast    [provider]  ondansetron (ZOFRAN ODT) 4 MG disintegrating tablet 92m ODT q4 hours prn nausea/vomiting Patient taking differently: Take 4 mg by mouth every 4 (four) hours as needed for nausea or vomiting.  06/15/15   Mesner, JCorene Cornea MD  [facility] HospitalDELICA LANCETS 387OMISC Use to check blood sugar 1 time  per day 02/12/15   Renato Shin, MD  Potassium Chloride ER 20 MEQ TBCR Take 1 tablet by mouth daily. NEEDS POTASSIUM RE-CHECK. 06/27/19   [name], [name], MD  predniSONE (DELTASONE) 5 MG tablet Take 5 mg by mouth daily with breakfast.  07/13/17   [provider]  QUEtiapine (SEROQUEL) 25 MG tablet Take 1 tablet (25 mg total) by mouth at bedtime. 07/28/15   Pieter Partridge, DO  sertraline (ZOLOFT) 100 MG tablet Take 0.5 tablets (50 mg total) by mouth daily. Patient taking differently: Take 50 mg by mouth daily as needed (anxiety).  01/25/17   Renato Shin, MD  sucralfate (CARAFATE) 1 g tablet Take 1 tablet (1 g total) by mouth 4 (four) times daily for 30 days. Patient not taking: Reported on 08/12/2019 08/22/18 09/21/18  Pokhrel, Corrie Mckusick, MD  tacrolimus (PROGRAF) 1 MG capsule Take 1-2 mg by mouth See admin instructions. 2 mg in the am and 1 mg at night 01/15/18   [provider]  warfarin (COUMADIN) 5 MG tablet TAKE 1/2 TO 1 TABLET BY MOUTH ON [place]; [place], [place], [place], [place] SUNDAY-2.5MG Patient taking differently: Take 2.5-5 mg by mouth daily at 6 PM.  03/27/19   [name], [name], MD    Allergies    Oxycodone-acetaminophen, Propoxyphene, Propoxyphene n-acetaminophen, Sulfonamide derivatives, Codeine, Hydrocodone-acetaminophen, Hydromorphone, Other, Oxycodone, Sulfamethoxazole, Tape, Gabapentin, Latex, Metoprolol, Morphine and related, and Rosiglitazone  Review of Systems   Review of Systems  Constitutional: Negative for chills and fever.  HENT: Negative for sore throat.   Eyes: Negative for pain, redness and visual disturbance.  Respiratory:  Negative for cough and shortness of breath.   Cardiovascular: Negative for chest pain, palpitations and leg swelling.  Gastrointestinal: Negative for abdominal pain, blood in stool, diarrhea and vomiting.  Endocrine: Negative for polyuria.  Genitourinary: Negative for dysuria and flank pain.  Musculoskeletal: Negative for back pain and neck pain.  Skin: Negative for rash.  Neurological: Positive for dizziness, weakness and light-headedness. Negative for speech difficulty, numbness and headaches.  Hematological: Does not bruise/bleed easily.  Psychiatric/Behavioral: Negative for confusion.    Physical Exam Updated Vital Signs Temp 98.8 F (37.1 Connie) (Oral)   Ht 1.626 m (5' 4")   Wt 78.9 kg   BMI 29.87 kg/m   Physical Exam Vitals and nursing note reviewed.  Constitutional:      Appearance: Normal appearance. She is well-developed.  HENT:     Head: Atraumatic.     Nose: Nose normal.     Mouth/Throat:     Mouth: Mucous membranes are moist.  Eyes:     General: No scleral icterus.    Conjunctiva/sclera: Conjunctivae normal.     Pupils: Pupils are equal, round, and reactive to light.  Neck:     Vascular: No carotid bruit.     Trachea: No tracheal deviation.  Cardiovascular:     Rate and Rhythm: Normal rate and regular rhythm.     Pulses: Normal pulses.     Heart sounds: Normal heart sounds. No murmur. No friction rub. No gallop.   Pulmonary:     Effort: Pulmonary effort is normal. No respiratory distress.     Breath sounds: Normal breath sounds.  Abdominal:     General: Bowel sounds are normal. There is no distension.     Palpations: Abdomen is soft. There is no mass.     Tenderness: There is no abdominal tenderness. There is no guarding.  Genitourinary:    Comments: No cva tenderness.  Musculoskeletal:        General: No swelling or tenderness.     Cervical back: Normal range of motion and neck supple. No rigidity. No muscular tenderness.  Skin:    General: Skin is warm  and dry.     Findings: No rash.  Neurological:     Mental Status: She is alert.     Comments: Alert, responds to questions. Pt with some dysarthria and expressive aphasia, but able to respond to questions and get her point across.  +right sided weakness, pt indicates Connie/w baseline. RUE/RLE 3+/5.   Psychiatric:        Mood and Affect: Mood normal.     ED Results / Procedures / Treatments   Labs (all labs ordered are listed, but only abnormal results are displayed) Results for orders placed or performed during the hospital encounter of 09/10/19  CBC  Result Value Ref Range   WBC 11.3 (H) 4.0 - 10.5 K/uL   RBC 3.85 (L) 3.87 - 5.11 MIL/uL   Hemoglobin 11.6 (L) 12.0 - 15.0 g/dL   HCT 37.5 36.0 - 46.0 %   MCV 97.4 80.0 - 100.0 fL   MCH 30.1 26.0 - 34.0 pg   MCHC 30.9 30.0 - 36.0 g/dL   RDW 15.3 11.5 - 15.5 %   Platelets 295 150 - 400 K/uL   nRBC 0.0 0.0 - 0.2 %  Comprehensive metabolic panel  Result Value Ref Range   Sodium 143 135 - 145 mmol/L   Potassium 3.6 3.5 - 5.1 mmol/L   Chloride 110 98 - 111 mmol/L   CO2 25 22 - 32 mmol/L   Glucose, Bld 154 (H) 70 - 99 mg/dL   BUN 16 6 - 20 mg/dL   Creatinine, Ser 0.70 0.44 - 1.00 mg/dL   Calcium 8.8 (L) 8.9 - 10.3 mg/dL   Total Protein 6.5 6.5 - 8.1 g/dL   Albumin 3.2 (L) 3.5 - 5.0 g/dL   AST 23 15 - 41 U/L   ALT 19 0 - 44 U/L   Alkaline Phosphatase 66 38 - 126 U/L   Total Bilirubin 0.6 0.3 - 1.2 mg/dL   GFR calc non Af Amer >60 >60 mL/min   GFR calc Af Amer >60 >60 mL/min   Anion gap 8 5 - 15  Urinalysis, Routine w reflex microscopic  Result Value Ref Range   Color, Urine YELLOW YELLOW   APPearance HAZY (A) CLEAR   Specific Gravity, Urine 1.028 1.005 - 1.030   pH 6.0 5.0 - 8.0   Glucose, UA NEGATIVE NEGATIVE mg/dL   Hgb urine dipstick NEGATIVE NEGATIVE   Bilirubin Urine NEGATIVE NEGATIVE   Ketones, ur NEGATIVE NEGATIVE mg/dL   Protein, ur NEGATIVE NEGATIVE mg/dL   Nitrite NEGATIVE NEGATIVE   Leukocytes,Ua NEGATIVE NEGATIVE   Protime-INR  Result Value Ref Range   Prothrombin Time 21.3 (H) 11.4 - 15.2 seconds   INR 1.9 (H) 0.8 - 1.2  Magnesium  Result Value Ref Range   Magnesium 1.6 (L) 1.7 - 2.4 mg/dL  CBG monitoring, ED  Result Value Ref Range   Glucose-Capillary 147 (H) 70 - 99 mg/dL   Comment 1 Document in Chart    EEG  Result Date: 08/13/2019 Lora Havens, MD     08/13/2019 10:43 AM Patient Name: CORNELL GABER MRN: [id number] Epilepsy Attending: Lora Havens Referring Physician/Provider: Dr. Karena Addison Aroor Date: 08/13/2019 Duration: 24.29 minutes Patient history: 59 year old female with history of left MCA stroke who presented with worsening  right-sided weakness and speech disturbance.  EEG to evaluate for seizures. Level of alertness: Awake AEDs during EEG study: None Technical aspects: This EEG study was done with scalp electrodes positioned according to the 10-20 International system of electrode placement. Electrical activity was acquired at a sampling rate of 500Hz and reviewed with a high frequency filter of 70Hz and a low frequency filter of 1Hz. EEG data were recorded continuously and digitally stored. Description: During awake state, the posterior dominant rhythm consists of 8 Hz activity of moderate voltage (25-35 uV) seen predominantly in posterior head regions, symmetric and reactive to eye opening and eye closing.  EEG showed continuous 5 to 7 Hz theta slowing in left centro- temporal region.  Sharp transients were seen in left frontotemporal region.  Physiologic photic driving was seen during photic stimulation.  Hyperventilation was not performed. Abnormality -Continuous slow, left centro- temporal region IMPRESSION: This study is suggestive of cortical dysfunction in the left frontotemporal region likely secondary to underlying infarct. No seizures or definite epileptiform discharges were seen throughout the recording. Lora Havens   DG Chest 2 View  Result Date: 08/12/2019 CLINICAL DATA:   Cough. EXAM: CHEST - 2 VIEW COMPARISON:  08/14/2018 FINDINGS: Heart size is at the upper limits of normal and stable. Aortic atherosclerosis incidentally noted. Both lungs are clear. Surgical clips again seen in lower neck and superior mediastinum. IMPRESSION: No active cardiopulmonary disease. Electronically Signed   By: Marlaine Hind M.D.   On: 08/12/2019 14:19   MR BRAIN WO CONTRAST  Result Date: 08/12/2019 CLINICAL DATA:  Code stroke follow-up EXAM: MRI HEAD WITHOUT CONTRAST TECHNIQUE: Multiplanar, multiecho pulse sequences of the brain and surrounding structures were obtained without intravenous contrast. COMPARISON:  2015 FINDINGS: Motion artifact is present. Brain: There is no acute infarction or intracranial hemorrhage. There is no intracranial mass, mass effect, or edema. There is no hydrocephalus or extra-axial fluid collection. Chronic left MCA territory infarction is present involving the frontoparietal lobes, insula, and basal ganglia. There is ex vacuo dilatation of the left lateral ventricle. Chronic blood products are present in association with the basal ganglia portion. There are additional multiple chronic small cerebellar infarcts. Additional patchy T2 hyperintensity in the supratentorial white matter is nonspecific but probably reflects mild chronic microvascular ischemic changes. Vascular: Major vessel flow voids at the skull base are preserved. Skull and upper cervical spine: Normal marrow signal is preserved. Sinuses/Orbits: Paranasal sinuses are aerated. Orbits are unremarkable. Other: Sella is unremarkable.  Mastoid air cells are clear. IMPRESSION: No acute infarction, hemorrhage, or mass. Chronic left MCA territory infarction.  Chronic cerebellar infarcts. Electronically Signed   By: Macy Mis M.D.   On: 08/12/2019 12:46   CT HEAD CODE STROKE WO CONTRAST  Result Date: 08/12/2019 CLINICAL DATA:  Code stroke.  Right-sided weakness EXAM: CT HEAD WITHOUT CONTRAST TECHNIQUE:  Contiguous axial images were obtained from the base of the skull through the vertex without intravenous contrast. COMPARISON:  2016 FINDINGS: Brain: There is no acute intracranial hemorrhage, mass effect, or edema. Chronic left MCA territory infarction is again identified primarily involving frontoparietal lobes, insula, and basal ganglia with ex vacuo dilatation of the adjacent left lateral ventricle. Multiple chronic cerebellar infarcts are also seen. There is no acute appearing loss of gray differentiation. Additional patchy and confluent hypoattenuation in the supratentorial white matter is nonspecific but probably reflects moderate chronic microvascular ischemic changes. There is no hydrocephalus. Vascular: No hyperdense vessel. Intracranial atherosclerotic calcification is present at the skull base. Skull: Unremarkable.  Sinuses/Orbits: No acute abnormality. Other: Mastoid air cells are clear. ASPECTS ([place] Stroke Program Early CT Score) - Ganglionic level infarction (caudate, lentiform nuclei, internal capsule, insula, M1-M3 cortex): 7 - Supraganglionic infarction (M4-M6 cortex): 3 Total score (0-10 with 10 being normal): 10 IMPRESSION: No acute intracranial hemorrhage or evidence of acute infarction. ASPECT score is 10 on the right. Chronic left MCA territory infarction. Chronic cerebellar infarcts. Moderate chronic microvascular ischemic changes. These results were communicated to Dr. Lorraine Lax at 11:16 amon 3/7/2021by text page via the [facility] messaging system. Electronically Signed   By: Macy Mis M.D.   On: 08/12/2019 11:20    ED ECG REPORT   Date: 09/10/2019  Rate: 107  Rhythm: sinus tachycardia  QRS Axis: left  Intervals: normal  ST/T Wave abnormalities: normal  Conduction Disutrbances:none  Narrative Interpretation:   Old EKG Reviewed: changes noted  I have personally reviewed the EKG tracing    Radiology CT HEAD WO CONTRAST  Result Date: 09/10/2019 CLINICAL DATA:  Dizziness.  EXAM: CT HEAD WITHOUT CONTRAST TECHNIQUE: Contiguous axial images were obtained from the base of the skull through the vertex without intravenous contrast. COMPARISON:  August 12, 2019 FINDINGS: Brain: No evidence of acute infarction, hemorrhage, hydrocephalus, extra-axial collection or mass lesion/mass effect. Prior left MCA territory infarcts are noted. There is extensive atrophy with ex vacuo dilatation of the left lateral ventricle. Moderate chronic microvascular ischemic changes are again noted. There is encephalomalacia involving the right superior cerebellum. Vascular: No hyperdense vessel or unexpected calcification. Skull: Normal. Negative for fracture or focal lesion. Sinuses/Orbits: No acute finding. Other: None. IMPRESSION: 1. No acute intracranial abnormality. 2. Chronic findings as detailed above, not significantly changed from recent prior CT head. Electronically Signed   By: Constance Holster M.D.   On: 09/10/2019 20:27   DG Chest Port 1 View  Result Date: 09/10/2019 CLINICAL DATA:  59 year old female with weakness. EXAM: PORTABLE CHEST 1 VIEW COMPARISON:  Chest radiograph dated 08/12/2019. FINDINGS: No focal consolidation, pleural effusion or pneumothorax. Stable cardiac silhouette. Atherosclerotic calcification of the aorta. No acute osseous pathology. IMPRESSION: No active disease. Electronically Signed   By: Anner Crete M.D.   On: 09/10/2019 20:26    Procedures Procedures (including critical care time)  Medications Ordered in ED Medications - No data to display  ED Course  I have reviewed the triage vital signs and the nursing notes.  Pertinent labs & imaging results that were available during my care of the patient were reviewed by me and considered in my medical decision making (see chart for details).    MDM Rules/Calculators/A&P                      Iv ns. Continuous pulse ox and monitor. Ecg. Stat labs. CT/imaging ordered.  Reviewed nursing notes and prior charts  for additional history. Current symptoms/exam appears similar to admission prior month - cva workup then, including mri neg for acute cva.   Labs reviewed/interpreted by me - chem normal. Renal fxn normal. Await UA.  CXR reviewed/interpreted by me - no pna.   CT reviewed/interpreted by me - no hem or cva.   Additional labs reviewed/interpreted by me - ua neg for infection.  Recheck pt, no new Connie/o, notes mild frontal headache, requests acetaminophen. Acetaminophen po. Po fluids.  On exam, no new neurologic findings/pt feels at baseline. No cp or increased wob. Chest cta. No abd pain, abd soft nt.   Additional labs reviewed/interpreted by me - mg sl  low, hx same. Mg po.   Pt declines trop or additional eval/labs. On recheck, feels at baseline, and indicates feels ready for d/Connie.  Currently, hr 94, rr 16, pulse ox 97% room air.   Patient currently appears stable for d/Connie.  RN indicates pts daughter feels pt Connie/w baseline, but that given prior cva/residual weakness, deconditioning, and assistance needs may need more support.  Transition of care team consulted in ED - they will speak w pt/fam - they indicate to place orders for home health/face to face, including PT, SW, Rn - home health referral made.   Rec close pcp f/u.   Return precautions provided.       Final Clinical Impression(s) / ED Diagnoses Final diagnoses:  None    Rx / DC Orders ED Discharge Orders    None          Lajean Saver, MD 09/10/19 2305

## 2019-09-10 NOTE — ED Notes (Signed)
Per EDP; blood specimens do not need to be collected.

## 2019-09-10 NOTE — Care Management (Addendum)
ED TOC team in at bedside spoke with patient, who reports being d/c'd from [facility] after 3 week Rehab today.  Patient states she felt dizzy and family called EMS.  Patient states she wants to return home. She has [place] services and PCA services a couple of hours per week.  Updated EDP  Dr. Ashok Cordia. Additional HH orders sent [facility] as per [facility] agency set up by facility  CM will update daughter. Patient will be transported by [facility].

## 2019-09-10 NOTE — Progress Notes (Signed)
HPI this patient presents the office with chief complaint of long thick painful nails. Patient has medical history of lupus, stroke as well as diabetes. Patient states that she has pain walking and wearing her shoes. She relates a recent stroke with left sided weakness.  She is unable to trim her own nails presents for at risk preventive foot care. She presents the office today for preventative foot care services.  She is currently on warfarin.         Objective:   Physical Exam GENERAL APPEARANCE: Alert, conversant. Appropriately groomed. No acute distress.  VASCULAR: Pedal pulses are  Not  palpable at  [facility] and PT bilateral.  Capillary refill time is immediate to all digits,  Normal temperature gradient.    NEUROLOGIC: sensation is normal to 5.07 monofilament at 5/5 sites bilateral.  Light touch is intact bilateral, Muscle strength normal.  MUSCULOSKELETAL: acceptable muscle strength, tone and stability bilateral.  Intrinsic muscluature intact bilateral.  Rectus appearance of foot and digits noted bilateral. Hallux varus B/L  DERMATOLOGIC: skin color, texture, and turgor are within normal limits.  No preulcerative lesions or ulcers  are seen, no interdigital maceration noted.  No open lesions present.  . No drainage noted.  NAILS  Thick disfigured discolored nails both feet.        Assessment & Plan:  Onychomycosis  DM with angiopathy  Debridement of Nails  RTC 3 months

## 2019-09-10 NOTE — ED Notes (Signed)
Connie Ruiz daughter [phone]

## 2019-09-10 NOTE — ED Notes (Signed)
Patient verbalizes understanding of discharge instructions. Opportunity for questioning and answers were provided. Pt is now awaiting transport back to home.

## 2019-09-10 NOTE — Discharge Instructions (Addendum)
It was our pleasure to provide your ER care today - we hope that you feel better.  Rest. Drink plenty of fluids. Take acetaminophen as need.  Follow up closely with primary care doctor in the next few days - call office tomorrow to arrange follow up appointment.   We have made home health referral to maximize home health services, including adding home health social work if you decide to pursue extended care facility.  Return to ER right away if worse, new symptoms, fevers, new or severe pain, weak/fainting, trouble breathing, new numbness/weakness, change in speech or vision, or other concern.

## 2019-09-11 ENCOUNTER — Emergency Department (HOSPITAL_COMMUNITY)
Admission: EM | Admit: 2019-09-11 | Discharge: 2019-09-13 | Disposition: A | Discharge status: Home / Self Care | Payer: Medicare Other | Source: Home / Self Care | Attending: Emergency Medicine | Admitting: Emergency Medicine

## 2019-09-11 ENCOUNTER — Emergency Department (HOSPITAL_COMMUNITY): Payer: Medicare Other

## 2019-09-11 ENCOUNTER — Other Ambulatory Visit: Payer: Self-pay

## 2019-09-11 ENCOUNTER — Ambulatory Visit: Payer: Self-pay | Admitting: *Deleted

## 2019-09-11 ENCOUNTER — Encounter (HOSPITAL_COMMUNITY): Payer: Self-pay | Admitting: Emergency Medicine

## 2019-09-11 DIAGNOSIS — Z743 Need for continuous supervision: Secondary | ICD-10-CM | POA: Diagnosis not present

## 2019-09-11 DIAGNOSIS — R519 Headache, unspecified: Secondary | ICD-10-CM | POA: Diagnosis not present

## 2019-09-11 DIAGNOSIS — R42 Dizziness and giddiness: Secondary | ICD-10-CM

## 2019-09-11 DIAGNOSIS — G459 Transient cerebral ischemic attack, unspecified: Secondary | ICD-10-CM | POA: Diagnosis not present

## 2019-09-11 DIAGNOSIS — I10 Essential (primary) hypertension: Secondary | ICD-10-CM | POA: Diagnosis not present

## 2019-09-11 DIAGNOSIS — R531 Weakness: Secondary | ICD-10-CM

## 2019-09-11 DIAGNOSIS — R279 Unspecified lack of coordination: Secondary | ICD-10-CM | POA: Diagnosis not present

## 2019-09-11 DIAGNOSIS — R000 Tachycardia, unspecified: Secondary | ICD-10-CM | POA: Diagnosis not present

## 2019-09-11 DIAGNOSIS — R0689 Other abnormalities of breathing: Secondary | ICD-10-CM | POA: Diagnosis not present

## 2019-09-11 DIAGNOSIS — I491 Atrial premature depolarization: Secondary | ICD-10-CM | POA: Diagnosis not present

## 2019-09-11 LAB — CBC
HCT: 38.1 % (ref 36.0–46.0)
Hemoglobin: 11.8 g/dL — ABNORMAL LOW (ref 12.0–15.0)
MCH: 29.8 pg (ref 26.0–34.0)
MCHC: 31 g/dL (ref 30.0–36.0)
MCV: 96.2 fL (ref 80.0–100.0)
Platelets: 299 10*3/uL (ref 150–400)
RBC: 3.96 MIL/uL (ref 3.87–5.11)
RDW: 15.1 % (ref 11.5–15.5)
WBC: 11.4 10*3/uL — ABNORMAL HIGH (ref 4.0–10.5)
nRBC: 0 % (ref 0.0–0.2)

## 2019-09-11 LAB — COMPREHENSIVE METABOLIC PANEL
ALT: 25 U/L (ref 0–44)
AST: 29 U/L (ref 15–41)
Albumin: 4 g/dL (ref 3.5–5.0)
Alkaline Phosphatase: 72 U/L (ref 38–126)
Anion gap: 10 (ref 5–15)
BUN: 18 mg/dL (ref 6–20)
CO2: 27 mmol/L (ref 22–32)
Calcium: 9.5 mg/dL (ref 8.9–10.3)
Chloride: 108 mmol/L (ref 98–111)
Creatinine, Ser: 0.49 mg/dL (ref 0.44–1.00)
GFR calc Af Amer: >60 mL/min (ref >60)
GFR calc non Af Amer: >60 mL/min (ref >60)
Glucose, Bld: 143 mg/dL — ABNORMAL HIGH (ref 70–99)
Potassium: 3.6 mmol/L (ref 3.5–5.1)
Sodium: 145 mmol/L (ref 135–145)
Total Bilirubin: 0.5 mg/dL (ref 0.3–1.2)
Total Protein: 7.2 g/dL (ref 6.5–8.1)

## 2019-09-11 LAB — DIFFERENTIAL
Abs Immature Granulocytes: 0.03 10*3/uL (ref 0.00–0.07)
Basophils Absolute: 0.1 10*3/uL (ref 0.0–0.1)
Basophils Relative: 1 %
Eosinophils Absolute: 0 10*3/uL (ref 0.0–0.5)
Eosinophils Relative: 0 %
Immature Granulocytes: 0 %
Lymphocytes Relative: 18 %
Lymphs Abs: 2 10*3/uL (ref 0.7–4.0)
Monocytes Absolute: 1.1 10*3/uL — ABNORMAL HIGH (ref 0.1–1.0)
Monocytes Relative: 9 %
Neutro Abs: 8.2 10*3/uL — ABNORMAL HIGH (ref 1.7–7.7)
Neutrophils Relative %: 72 %

## 2019-09-11 LAB — PROTIME-INR
INR: 1.7 — ABNORMAL HIGH (ref 0.8–1.2)
Prothrombin Time: 20.1 seconds — ABNORMAL HIGH (ref 11.4–15.2)

## 2019-09-11 LAB — ETHANOL: Alcohol, Ethyl (B): <10 mg/dL (ref <10)

## 2019-09-11 LAB — I-STAT BETA HCG BLOOD, ED (MC, WL, AP ONLY): I-stat hCG, quantitative: <5 m[IU]/mL (ref <5)

## 2019-09-11 LAB — APTT: aPTT: 39 seconds — ABNORMAL HIGH (ref 24–36)

## 2019-09-11 MED ORDER — ACETAMINOPHEN 325 MG PO TABS
650.0000 mg | ORAL_TABLET | Freq: Once | ORAL | Status: AC
  Administered 2019-09-11: 650 mg via ORAL
  Filled 2019-09-11: qty 2

## 2019-09-11 MED ORDER — CALCITRIOL 0.25 MCG PO CAPS
0.2500 ug | ORAL_CAPSULE | Freq: Every day | ORAL | Status: DC
  Administered 2019-09-11 – 2019-09-13 (×2): 0.25 ug via ORAL
  Filled 2019-09-11 (×3): qty 1

## 2019-09-11 MED ORDER — DILTIAZEM HCL ER COATED BEADS 180 MG PO CP24
360.0000 mg | ORAL_CAPSULE | Freq: Every day | ORAL | Status: DC
  Administered 2019-09-11 – 2019-09-13 (×3): 360 mg via ORAL
  Filled 2019-09-11 (×4): qty 2

## 2019-09-11 MED ORDER — QUETIAPINE FUMARATE 25 MG PO TABS
25.0000 mg | ORAL_TABLET | Freq: Every day | ORAL | Status: DC
  Administered 2019-09-12: 25 mg via ORAL
  Filled 2019-09-11: qty 1

## 2019-09-11 MED ORDER — SERTRALINE HCL 50 MG PO TABS: 50.0000 mg | ORAL_TABLET | Freq: Every day | ORAL | Status: DC | PRN

## 2019-09-11 MED ORDER — ATORVASTATIN CALCIUM 40 MG PO TABS
40.0000 mg | ORAL_TABLET | Freq: Every day | ORAL | Status: DC
  Administered 2019-09-11: 40 mg via ORAL
  Filled 2019-09-11 (×4): qty 1

## 2019-09-11 MED ORDER — LEVOTHYROXINE SODIUM 50 MCG PO TABS
175.0000 ug | ORAL_TABLET | Freq: Every day | ORAL | Status: DC
  Administered 2019-09-12 – 2019-09-13 (×2): 175 ug via ORAL
  Filled 2019-09-11 (×2): qty 1

## 2019-09-11 MED ORDER — TACROLIMUS 1 MG PO CAPS
1.0000 mg | ORAL_CAPSULE | Freq: Every day | ORAL | Status: DC
  Administered 2019-09-12: 22:00:00 1 mg via ORAL
  Filled 2019-09-11 (×3): qty 1

## 2019-09-11 MED ORDER — ALENDRONATE SODIUM 70 MG PO TABS: 70.0000 mg | ORAL_TABLET | ORAL | Status: DC

## 2019-09-11 MED ORDER — TRAMADOL HCL 50 MG PO TABS
50.0000 mg | ORAL_TABLET | Freq: Once | ORAL | Status: AC
  Administered 2019-09-11: 21:00:00 50 mg via ORAL
  Filled 2019-09-11: qty 1

## 2019-09-11 MED ORDER — WARFARIN - PHYSICIAN DOSING INPATIENT: Freq: Every day | Status: DC

## 2019-09-11 MED ORDER — SODIUM CHLORIDE 0.9 % IV SOLN: 10.0000 mL/h | Freq: Once | INTRAVENOUS | Status: DC

## 2019-09-11 MED ORDER — LOSARTAN POTASSIUM 50 MG PO TABS
100.0000 mg | ORAL_TABLET | Freq: Every day | ORAL | Status: DC
  Administered 2019-09-11 – 2019-09-13 (×2): 100 mg via ORAL
  Filled 2019-09-11 (×3): qty 2

## 2019-09-11 MED ORDER — WARFARIN SODIUM 2.5 MG PO TABS: 2.5000 mg | ORAL_TABLET | Freq: Every day | ORAL | Status: DC

## 2019-09-11 MED ORDER — TACROLIMUS 1 MG PO CAPS
2.0000 mg | ORAL_CAPSULE | Freq: Every day | ORAL | Status: DC
  Administered 2019-09-12 – 2019-09-13 (×2): 2 mg via ORAL
  Filled 2019-09-11 (×2): qty 2

## 2019-09-11 MED ORDER — FUROSEMIDE 20 MG PO TABS
20.0000 mg | ORAL_TABLET | Freq: Every day | ORAL | Status: DC
  Administered 2019-09-12 – 2019-09-13 (×2): 20 mg via ORAL
  Filled 2019-09-11 (×3): qty 1

## 2019-09-11 MED ORDER — WARFARIN SODIUM 6 MG PO TABS
6.0000 mg | ORAL_TABLET | Freq: Once | ORAL | Status: AC
  Administered 2019-09-11: 6 mg via ORAL
  Filled 2019-09-11: qty 1

## 2019-09-11 MED ORDER — WARFARIN - PHARMACIST DOSING INPATIENT: Freq: Every day | Status: DC

## 2019-09-11 MED ORDER — PANTOPRAZOLE SODIUM 40 MG PO TBEC
40.0000 mg | DELAYED_RELEASE_TABLET | Freq: Every day | ORAL | Status: DC
  Administered 2019-09-12 – 2019-09-13 (×2): 40 mg via ORAL
  Filled 2019-09-11 (×3): qty 1

## 2019-09-11 MED ORDER — DILTIAZEM HCL ER BEADS 240 MG PO CP24: 360.0000 mg | ORAL_CAPSULE | Freq: Every day | ORAL | Status: DC

## 2019-09-11 NOTE — ED Notes (Signed)
Patient verbalizes understanding of discharge instructions. Opportunity for questioning and answers were provided. Armband removed by staff, pt discharged from ED. Pt. D/c home with PTAR

## 2019-09-11 NOTE — Progress Notes (Signed)
CSW spoke to EDP who states a attempt for placement due to safety concerns in the home (pt cannot ambulate and daughter states she cannot care for the pt) is appropriate.  Per chart, pt has a PASRR:  [id number]  CSW met with pt who stated she preferred to return to [facility] utilizing Medicaid and stated she could not pay [facility]- Medicare co-pay payment.  Pt provided verbal permission for the CSW to updated her daughter Jaquelinne Glendening at ph: [phone]  CSW spoke to the pt and the pt's daughertPt confirmed pt's/pt's daughter's plan to be discharged back to [facility] to for additional rehab at discharge and is agreeable to remaining at [facility] for the required 30-day stay.  CSW provided active listening and validated pt's concerns that she not return home for safety.   CSW was given verbal permission for the CSW to complete FL-2 and send referrals out to SNF facilities [facility], via the hub per pt's request.  Pt has been living with her daughter prior to her being at [facility] for 30 days and then being admitted to [facility].  CSW will continue to follow for D/C needs.  Alphonse Guild. Austen Oyster  MSW, LCSW, LCAS, CSI Transitions of Care Clinical Social Worker Care Coordination Department Ph: [phone]

## 2019-09-11 NOTE — Progress Notes (Signed)
ANTICOAGULATION CONSULT NOTE - Initial Consult  Pharmacy Consult for Warfarin Indication: h/o DVT/PE  Allergies  Allergen Reactions  . Oxycodone-Acetaminophen Shortness Of Breath and Nausea Only  . Propoxyphene Nausea Only and Shortness Of Breath    States takes tylenol at home  . Propoxyphene N-Acetaminophen Shortness Of Breath and Nausea Only  . Sulfonamide Derivatives Shortness Of Breath and Nausea Only  . Codeine Nausea Only  . Hydrocodone-Acetaminophen   . Hydromorphone   . Other Other (See Comments)    No blood, Jehovaeh Witness   . Oxycodone   . Sulfamethoxazole   . Tape     Redness**PAPER TAPE OK**  . Gabapentin Anxiety    twitching  . Latex Rash  . Metoprolol Rash  . Morphine And Related Rash    IV site on arm is red, patient reports this is improving.  NO shortness of breath reported.  . Rosiglitazone Rash    Patient Measurements: Height: 5\' 4"  (162.6 cm) Weight: 78.9 kg (174 lb) IBW/kg (Calculated) : 54.7 Heparin Dosing Weight:   Vital Signs: Temp: 98.3 F (36.8 C) (04/06 1255) Temp Source: Oral (04/06 1255) BP: 166/107 (04/06 1745) Pulse Rate: 123 (04/06 1745)  Labs: Recent Labs    09/10/19 2012 09/11/19 1439 09/11/19 1610  HGB 11.6* 11.8*  --   HCT 37.5 38.1  --   PLT 295 299  --   APTT  --   --  39*  LABPROT 21.3*  --  20.1*  INR 1.9*  --  1.7*  CREATININE 0.70 0.49  --   TROPONINIHS 10  --   --     Estimated Creatinine Clearance: 77.9 mL/min (by C-G formula based on SCr of 0.49 mg/dL).   Medical History: Past Medical History:  Diagnosis Date  . Anxiety   . Candida esophagitis ([place]) 11/12/2014  . CEREBROVASCULAR ACCIDENT, ACUTE 04/15/2010  . CLOSTRIDIUM DIFFICILE COLITIS, HX OF 08/21/2007  . CONGESTIVE HEART FAILURE 08/21/2007  . Current use of long term anticoagulation    Dr. Andree Elk, [facility]  . CVA 04/17/2010  . Depression    Dr. Andree Elk, [place], TYPE II 08/21/2007  . DVT, HX OF 08/21/2007  . GERD 08/21/2007  .  GOUT 08/21/2007   . History of stroke with residual effects   . HYPERLIPIDEMIA 08/21/2007  . HYPERTENSION 08/21/2007   Dr. Andree Elk, [place], HX OF 08/22/2007   s/p renal transplant-Dr. Andree Elk, [place] 08/21/2007  . OSTEOPOROSIS 08/21/2007   Rheumatol at [facility]  . Pulmonary embolism ([place]) 07/16/2010  . Renal failure   . RENAL INSUFFICIENCY 08/21/2007  . Right sided weakness   . Steroid-induced hyperglycemia 11/09/2014  . Tachycardia   . THYROID NODULE, LEFT 04/10/2009    Medications:   PTA Warfarin 4mg  daily - LD reported 09/06/2019  Assessment:  59 yr female who comes to ED with complaint of weakness.  PMH significant for CVA, CHF, DVT/PE, HTN, HLD, kidney transplant, lupus  Home regimen of warfarin ordered to continue by MD.  INR resulted @ 17:20 = 1.7 (subtherapeutic).  Contacted Dr Melina Copa and received order for pharmacy to dose warfarin while patient in hospital  Goal of Therapy:  INR 2-3    Plan:   Warfarin 6mg  po x 1 dose tonight  Daily PT/INR  Jabriel Vanduyne, Toribio Harbour, PharmD 09/11/2019,6:13 PM

## 2019-09-11 NOTE — Progress Notes (Signed)
PHARMACIST - PHYSICIAN COMMUNICATION  CONCERNING: P&T Medication Policy Regarding Oral Bisphosphonates  RECOMMENDATION: Your order for alendronate (Fosamax) has been discontinued at this time.  If the patient's post-hospital medical condition warrants safe use of this class of drugs, please resume the pre-hospital regimen upon discharge.  DESCRIPTION:  Alendronate (Fosamax) can cause severe esophageal erosions in patients who are unable to remain upright at least 30 minutes after taking this medication.   Since brief interruptions in therapy are thought to have minimal impact on bone mineral density, the Pharmacy & Therapeutics Committee has established that bisphosphonate orders should be routinely discontinued during hospitalization.   To override this safety policy and permit administration of Fosamax in the hospital, prescribers must write "DO NOT HOLD" in the comments section when placing the order for this class of medications.   Apryle Stowell, PharmD 

## 2019-09-11 NOTE — ED Notes (Signed)
Below order not completed by EW. 

## 2019-09-11 NOTE — Progress Notes (Addendum)
Consult request has been received. CSW attempting to follow up at present time.  CSW spoke to pt who was agreeable and voiced understanding and provided verbal permission and agreement that CSW could contact [facility] where pt had been, per notes, for 30 days.  CSW called Olivia Mackie at [facility] who confirmed pt would be in co-pay days if pt returns under her [facility] Medicare and would have to pay $186 a day to return (and would have to pay for 20 days up front totalling approx $3,720 up front).  Per Olivia Mackie, on Wed 4.7 [place] will "run pt's Medicaid to make sure it will pay for SNF, and if so pt can return utilizing her Medicaid, but pt will have to agree and sign that she will stay the full 30 days (if her Medicaid is appropriate for a SNF stay).  Per Olivia Mackie, pt will need a new FL-2, Covid test and PT recommendation, as well.  Per Lin[facility] Orn if pt's Medicaid is appropriate they can take pt on 4/7 pending pt's COVID results and PTrecommendations.  EDP agreeable to initiating both.  CSW will continue to follow for D/C needs.  Alphonse Guild. Jaquane Boughner  MSW, LCSW, LCAS, CSI Transitions of Care Clinical Social Worker Care Coordination Department Ph: [phone]

## 2019-09-11 NOTE — ED Provider Notes (Addendum)
Signout from Dr. Zenia Resides.  59 year old female with history of stroke recently left rehab here with increased dizziness and right-sided weakness.  She had a work-up yesterday that did not show any significant findings.  Today she has had an MRI which also does not show any acute findings.  Plan is to follow-up on labs that have been done and consult TOC for placement. Physical Exam  BP (!) 152/97 (BP Location: Left Arm)   Pulse (!) 109   Temp 98.3 F (36.8 C) (Oral)   Resp 16   Ht 5\' 4"  (1.626 m)   Wt 78.9 kg   SpO2 100%   BMI 29.87 kg/m   Physical Exam  ED Course/Procedures      Procedures  MDM  Received a call from [place] from social work.  He is asking that we place a Covid test on the patient and a PT consult.  They reached out to a facility and it sounds like they anticipate she may be able to be placed  Tomorrow.  I wrote for her home meds.  She is tachycardic and hypertensive.  These seem to be trending in the right direction slowly after her medications were given.  She is not having any symptoms related to this.  In error I ordered the patient to receive a transfusion and to be admitted.  This was to be for another patient.  The patient did not receive a transfusion and I canceled this order.  The patient currently is a border in the ER awaiting social work case management for potential placement to skilled nursing facility due to her inability to manage at home.  I updated Dr. Freddi Che of this error and the patient status in the department currently.      Hayden Rasmussen, MD 09/11/19 2314    Hayden Rasmussen, MD 09/12/19 1025

## 2019-09-11 NOTE — ED Provider Notes (Signed)
[place] DEPT Provider Note   CSN: [id number] Arrival date & time: 09/11/19  1238     History Chief Complaint  Patient presents with  . Weakness    Connie Ruiz is a 59 y.o. female.  59 year old female who presents with increasing right-sided weakness with some associated dizziness.  Patient seen at [facility] yesterday for similar symptoms.  That work-up was reviewed and some concern initially that patient might require more assistance at home but after patient seen by home health staff it was felt that she was safe for discharge.  States that she normally uses a walker to ambulate but that today she became more weak.  States that she she did feel her baseline prior to being discharged yesterday.  Today she states that the room feels like it spinning when she tries to stand up.  Denies any severe headaches, chest pain, any weakness to her left side.  No visual changes.  No changes to her medications.  Called EMS was transported here        Past Medical History:  Diagnosis Date  . Anxiety   . Candida esophagitis ([place]) 11/12/2014  . CEREBROVASCULAR ACCIDENT, ACUTE 04/15/2010  . CLOSTRIDIUM DIFFICILE COLITIS, HX OF 08/21/2007  . CONGESTIVE HEART FAILURE 08/21/2007  . Current use of long term anticoagulation    Dr. Andree Elk, [facility]  . CVA 04/17/2010  . Depression    Dr. Andree Elk, [place], TYPE II 08/21/2007  . DVT, HX OF 08/21/2007  . GERD 08/21/2007  . GOUT 08/21/2007  . History of stroke with residual effects   . HYPERLIPIDEMIA 08/21/2007  . HYPERTENSION 08/21/2007   Dr. Andree Elk, [place], HX OF 08/22/2007   s/p renal transplant-Dr. Andree Elk, [place] 08/21/2007  . OSTEOPOROSIS 08/21/2007   [facility] at [facility]  . Pulmonary embolism ([place]) 07/16/2010  . Renal failure   . RENAL INSUFFICIENCY 08/21/2007  . Right sided weakness   . Steroid-induced hyperglycemia 11/09/2014  . Tachycardia   . THYROID  NODULE, LEFT 04/10/2009    Patient Active Problem List   Diagnosis Date Noted  . 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 Hypothyroidism 01/14/2018  . HLD (hyperlipidemia) 01/14/2018  . CVA (cerebral vascular accident) ([place]) 01/12/2018  . Chest pain 01/11/2018  . Acute respiratory failure with hypoxia ([place]) 07/20/2017  . Abdominal bloating 07/20/2017  . Sinus tachycardia 07/20/2017  . SLE (systemic lupus erythematosus related syndrome) ([place]) 07/20/2017  . CAP (community acquired pneumonia) 07/16/2017  . Long term (current) use of anticoagulants [Z79.01] 06/21/2017  . Screening examination for infectious disease 01/28/2016  . Type 2 diabetes mellitus ([place]) 08/03/2015  . Urinary tract infection 07/20/2015  . Vascular dementia ([place]) 05/08/2015  . Hemiparesis as late effect of cerebrovascular accident (CVA) ([place]) 05/08/2015  . Aphasia as late effect of stroke 05/08/2015  . C. difficile colitis 02/25/2015  . Hypokalemia 02/25/2015  . CKD (chronic kidney disease) 02/25/2015  . Colitis 02/14/2015  . Sepsis ([place]) 02/14/2015  . Nausea vomiting and diarrhea 02/14/2015  . Abdominal pain 02/14/2015  . UTI (lower urinary tract infection) 02/14/2015  . Abdominal pain, chronic, generalized 01/07/2015  . Candida esophagitis ([place]) 11/12/2014  . Abnormal CT of the abdomen   . Steroid-induced hyperglycemia 11/09/2014  . Hypomagnesemia 11/06/2014  . Abdominal pain, acute   . Supratherapeutic INR 11/02/2014  . Jejunitis 11/02/2014  . Myalgia and myositis 04/05/2014  . Wellness examination 04/05/2014  . Menopausal state 04/05/2014  . Palpitations  08/31/2013  . Hyperlipidemia 08/31/2013  . Disorder of heart rhythm 08/13/2013  . TIA (transient ischemic attack) 06/20/2013  . Gout flare: R elbow and R shoulder 06/20/2013  . Acute encephalopathy 06/14/2013  . Rhabdomyolysis 06/14/2013  . History of stroke with residual effects   . Right sided weakness   . Breast mass, left 04/30/2013  .  Contusion of knee, left 10/18/2012  . Depression 10/05/2012  . Encounter for long-term (current) use of other medications 10/04/2012  . Multiple thyroid nodules 06/08/2012  . Risk for falls 05/05/2012  . Physical deconditioning 05/05/2012  . Fever 04/27/2012  . DVT (deep venous thrombosis) ([place]) 06/17/2011  . Expressive aphasia 06/17/2011  . Dysphasia 06/11/2011  . Incontinence of urine 06/11/2011  . Hand pain, left 06/11/2011  . Immunosuppressive management encounter following kidney transplant 2011/05/24  . Hypertension goal BP (blood pressure) < 130/80 05-24-2011  . Deceased-donor kidney transplant recipient 2011-05-24  . Arm pain, left 05/07/2011  . Dysfunctional voiding of urine 04/28/2011  . Urge incontinence 04/28/2011  . Urinary urgency 04/28/2011  . Routine general medical examination at a health care facility 04/13/2011  . Anticoagulated 01/08/2011  . Pulmonary embolism ([place]) 07/16/2010  . Cerebral artery occlusion with cerebral infarction ([place]) 04/17/2010  . CEREBROVASCULAR ACCIDENT, ACUTE 04/15/2010  . THYROID NODULE, LEFT 04/10/2009  . Cough 03/26/2009  . ACUTE BRONCHITIS 08/22/2007  . KIDNEY TRANSPLANTATION, HX OF 08/22/2007  . Gout 08/21/2007  . Essential hypertension 08/21/2007  . Chronic diastolic congestive heart failure ([place]) 08/21/2007  . GERD 08/21/2007  . Disorder resulting from impaired renal function 08/21/2007  . LUPUS 08/21/2007  . Osteoporosis 08/21/2007  . DVT, HX OF 08/21/2007  . Enteritis due to Clostridium difficile 08/21/2007    Past Surgical History:  Procedure Laterality Date  . CESAREAN SECTION    . CHOLECYSTECTOMY    . ENTEROSCOPY N/A 11/11/2014   Procedure: ENTEROSCOPY;  Surgeon: Ladene Artist, MD;  Location: [facility] ENDOSCOPY;  Service: Endoscopy;  Laterality: N/A;  . KIDNEY TRANSPLANT Right 2009  . RENAL BIOPSY, OPEN  1981  . TUBAL LIGATION       OB History    Gravida  1   Para  1   Term  1   Preterm      AB      Living   1     SAB      TAB      Ectopic      Multiple      Live Births  1           Family History  Problem Relation Age of Onset  . Heart attack Mother   . Heart disease Father   . Asthma Sister   . Asthma Daughter   . Cancer Maternal Grandfather        prostate  . Cancer Paternal Grandfather        colon    Social History   Tobacco Use  . Smoking status: Never Smoker  . Smokeless tobacco: Never Used  Substance Use Topics  . Alcohol use: No    Alcohol/week: 0.0 standard drinks  . Drug use: No    Home Medications Prior to Admission medications   Medication Sig Start Date End Date Taking? Authorizing Provider  acetaminophen (TYLENOL) 500 MG tablet Take 1,000 mg by mouth daily as needed for mild pain.    [provider]  alendronate (FOSAMAX) 70 MG tablet TAKE 1 TABLET BY MOUTH ONCE A WEEK ON AN EMPTY STOMACH  ON SATURDAYS WITH A FULL GLASS OF WATER. Patient taking differently: Take 70 mg by mouth once a week.  09/17/17   Renato Shin, MD  atorvastatin (LIPITOR) 40 MG tablet Take 40 mg by mouth daily at 6 PM.  07/26/17   [provider]  b complex-vitamin c-folic acid (NEPHRO-VITE) 0.8 MG TABS tablet Take 1 tablet by mouth daily. 05/05/12   [provider]  Blood Glucose Monitoring Suppl (ACCU-CHEK AVIVA PLUS) w/Device KIT 1 Device by Does not apply route daily. 10/13/18   [name], [name], MD  calcitRIOL (ROCALTROL) 0.25 MCG capsule TAKE 1 CAPSULE(0.25 MCG) BY MOUTH DAILY. NO REFILLS WITHOUT APPOINTMENT Patient taking differently: Take 0.25 mcg by mouth daily. TAKE 1 CAPSULE(0.25 MCG) BY MOUTH DAILY. NO REFILLS WITHOUT APPOINTMENT 02/13/19   Renato Shin, MD  diltiazem [facility]) 360 MG 24 hr capsule Take 1 capsule (360 mg total) by mouth daily. 06/27/19   [name], [name], MD  donepezil (ARICEPT) 10 MG tablet Take 10 mg by mouth at bedtime.  11/18/16   [provider]  enoxaparin (LOVENOX) 80 MG/0.8ML injection Inject 0.8 mLs (80 mg total) into  the skin every 12 (twelve) hours for 5 days. 08/15/19 08/20/19  Donne Hazel, MD  esomeprazole (NEXIUM) 20 MG capsule TAKE 1 CAPSULE(20 MG) BY MOUTH DAILY Patient taking differently: Take 20 mg by mouth daily at 12 noon.  07/17/18   Renato Shin, MD  feeding supplement, ENSURE ENLIVE, (ENSURE ENLIVE) LIQD Take 237 mLs by mouth 2 (two) times daily between meals. 08/22/18   Pokhrel, Corrie Mckusick, MD  furosemide (LASIX) 20 MG tablet Take 20 mg by mouth daily. 04/25/19   [provider]  glucose blood (ONE TOUCH ULTRA TEST) test strip Use to check blood sugar 1 time per day 02/12/15   Renato Shin, MD  levothyroxine (SYNTHROID) 175 MCG tablet TAKE 1 TABLET(175 MCG) BY MOUTH DAILY BEFORE BREAKFAST Patient taking differently: Take 175 mcg by mouth daily before breakfast.  05/15/19   [name], [name], MD  losartan (COZAAR) 100 MG tablet Take 1 tablet (100 mg total) by mouth daily. 01/03/19   [name], [name], MD  magnesium oxide (MAG-OX) 400 MG tablet Take 800 mg by mouth daily.  01/11/17   [provider]  metFORMIN (GLUCOPHAGE-XR) 500 MG 24 hr tablet TAKE 1 TABLET(500 MG) BY MOUTH DAILY WITH BREAKFAST Patient taking differently: Take 500 mg by mouth daily with breakfast.  06/05/19   [name], [name], MD  mirtazapine (REMERON) 30 MG tablet Take 1 tablet (30 mg total) by mouth at bedtime. 07/28/15   Pieter Partridge, DO  mycophenolate (CELLCEPT) 250 MG capsule Take 250 mg by mouth 2 (two) times daily.  07/13/17   [provider]  nystatin (MYCOSTATIN) powder Apply 1 g topically 4 (four) times daily as needed. For yeast under breast    [provider]  ondansetron (ZOFRAN ODT) 4 MG disintegrating tablet 86m ODT q4 hours prn nausea/vomiting Patient taking differently: Take 4 mg by mouth every 4 (four) hours as needed for nausea or vomiting.  06/15/15   Mesner, JCorene Cornea MD  [facility] LANCETS 368TMISC Use to check blood sugar 1 time per day 02/12/15   ERenato Shin MD  Potassium Chloride ER  20 MEQ TBCR Take 1 tablet by mouth daily. NEEDS POTASSIUM RE-CHECK. 06/27/19   JMartinique [name], MD  predniSONE (DELTASONE) 5 MG tablet Take 5 mg by mouth daily with breakfast.  07/13/17   [provider]  QUEtiapine (SEROQUEL)  25 MG tablet Take 1 tablet (25 mg total) by mouth at bedtime. 07/28/15   Pieter Partridge, DO  sertraline (ZOLOFT) 100 MG tablet Take 0.5 tablets (50 mg total) by mouth daily. Patient taking differently: Take 50 mg by mouth daily as needed (anxiety).  01/25/17   Renato Shin, MD  sucralfate (CARAFATE) 1 g tablet Take 1 tablet (1 g total) by mouth 4 (four) times daily for 30 days. Patient not taking: Reported on 08/12/2019 08/22/18 09/21/18  Pokhrel, Corrie Mckusick, MD  tacrolimus (PROGRAF) 1 MG capsule Take 1-2 mg by mouth See admin instructions. 2 mg in the am and 1 mg at night 01/15/18   [provider]  warfarin (COUMADIN) 5 MG tablet TAKE 1/2 TO 1 TABLET BY MOUTH ON [place]; [place], [place], [place], [place] SUNDAY-2.5MG Patient taking differently: Take 2.5-5 mg by mouth daily at 6 PM.  03/27/19   [name], [name], MD    Allergies    Oxycodone-acetaminophen, Propoxyphene, Propoxyphene n-acetaminophen, Sulfonamide derivatives, Codeine, Hydrocodone-acetaminophen, Hydromorphone, Other, Oxycodone, Sulfamethoxazole, Tape, Gabapentin, Latex, Metoprolol, Morphine and related, and Rosiglitazone  Review of Systems   Review of Systems  All other systems reviewed and are negative.   Physical Exam Updated Vital Signs BP (!) 152/97 (BP Location: Left Arm)   Pulse (!) 109   Temp 98.3 F (36.8 C) (Oral)   Resp 16   Ht 1.626 m ('5\' 4"' )   Wt 78.9 kg   SpO2 100%   BMI 29.87 kg/m   Physical Exam Vitals and nursing note reviewed.  Constitutional:      General: She is not in acute distress.    Appearance: Normal appearance. She is well-developed. She is not toxic-appearing.  HENT:     Head: Normocephalic and atraumatic.  Eyes:     General: Lids are normal.      Conjunctiva/sclera: Conjunctivae normal.     Pupils: Pupils are equal, round, and reactive to light.  Neck:     Thyroid: No thyroid mass.     Trachea: No tracheal deviation.  Cardiovascular:     Rate and Rhythm: Normal rate and regular rhythm.     Heart sounds: Normal heart sounds. No murmur. No gallop.   Pulmonary:     Effort: Pulmonary effort is normal. No respiratory distress.     Breath sounds: Normal breath sounds. No stridor. No decreased breath sounds, wheezing, rhonchi or rales.  Abdominal:     General: Bowel sounds are normal. There is no distension.     Palpations: Abdomen is soft.     Tenderness: There is no abdominal tenderness. There is no rebound.  Musculoskeletal:        General: No tenderness. Normal range of motion.     Cervical back: Normal range of motion and neck supple.  Skin:    General: Skin is warm and dry.     Findings: No abrasion or rash.  Neurological:     Mental Status: She is alert and oriented to person, place, and time.     GCS: GCS eye subscore is 4. GCS verbal subscore is 5. GCS motor subscore is 6.     Cranial Nerves: No cranial nerve deficit, dysarthria or facial asymmetry.     Sensory: No sensory deficit.     Motor: Weakness present. No tremor.     Comments: Strength is 3 of 5 in right upper and lower extremity  Psychiatric:        Speech: Speech normal.  Behavior: Behavior normal.     ED Results / Procedures / Treatments   Labs (all labs ordered are listed, but only abnormal results are displayed) Labs Reviewed  ETHANOL  PROTIME-INR  APTT  CBC  DIFFERENTIAL  COMPREHENSIVE METABOLIC PANEL  RAPID URINE DRUG SCREEN, HOSP PERFORMED  URINALYSIS, ROUTINE W REFLEX MICROSCOPIC  I-STAT CHEM 8, ED    EKG None  Radiology CT HEAD WO CONTRAST  Result Date: 09/10/2019 CLINICAL DATA:  Dizziness. EXAM: CT HEAD WITHOUT CONTRAST TECHNIQUE: Contiguous axial images were obtained from the base of the skull through the vertex without  intravenous contrast. COMPARISON:  August 12, 2019 FINDINGS: Brain: No evidence of acute infarction, hemorrhage, hydrocephalus, extra-axial collection or mass lesion/mass effect. Prior left MCA territory infarcts are noted. There is extensive atrophy with ex vacuo dilatation of the left lateral ventricle. Moderate chronic microvascular ischemic changes are again noted. There is encephalomalacia involving the right superior cerebellum. Vascular: No hyperdense vessel or unexpected calcification. Skull: Normal. Negative for fracture or focal lesion. Sinuses/Orbits: No acute finding. Other: None. IMPRESSION: 1. No acute intracranial abnormality. 2. Chronic findings as detailed above, not significantly changed from recent prior CT head. Electronically Signed   By: Constance Holster M.D.   On: 09/10/2019 20:27   DG Chest Port 1 View  Result Date: 09/10/2019 CLINICAL DATA:  59 year old female with weakness. EXAM: PORTABLE CHEST 1 VIEW COMPARISON:  Chest radiograph dated 08/12/2019. FINDINGS: No focal consolidation, pleural effusion or pneumothorax. Stable cardiac silhouette. Atherosclerotic calcification of the aorta. No acute osseous pathology. IMPRESSION: No active disease. Electronically Signed   By: Anner Crete M.D.   On: 09/10/2019 20:26    Procedures Procedures (including critical care time)  Medications Ordered in ED Medications - No data to display  ED Course  I have reviewed the triage vital signs and the nursing notes.  Pertinent labs & imaging results that were available during my care of the patient were reviewed by me and considered in my medical decision making (see chart for details).    MDM Rules/Calculators/A&P                      Patient MRI of the brain which was negative for stroke.  Review of the patient's old records as well as speak with her states that she was just discharged from rehab yesterday and had a visit to [facility] ED.  Yesterday was suggested that maybe she might need  to go to a skilled facility.  Family had agreed that they could take care of at home with the current resources that they had.  Today her weakness does not have an etiology.  Patient is now agreeable.  Patient's remaining labs are pending at this time and will be followed up by Dr. Melina Copa.  He will consult transitions of care for SNF placement Final Clinical Impression(s) / ED Diagnoses Final diagnoses:  None    Rx / DC Orders ED Discharge Orders    None       Lacretia Leigh, MD 09/11/19 1535

## 2019-09-11 NOTE — Progress Notes (Addendum)
Assessment complete, signed FL-2 sent via the hub.  PT consult and COVID test initiated.  Pending approval on Medicaid pt can go to [facility] on 09/12/19, per Olivia Mackie in admissions at ph: [phone].  Pt and pt's daughter is aware and in agreement.  2nd shift ED CSW will leave handoff for 1st shift ED CSW.  CSW will continue to follow for D/C needs.  Alphonse Guild. Shani Fitch  MSW, LCSW, LCAS, CSI Transitions of Care Clinical Social Worker Care Coordination Department Ph: [phone]

## 2019-09-11 NOTE — TOC Initial Note (Signed)
Transition of Care [facility]) - Initial/Assessment Note    Patient Details  Name: Connie Ruiz MRN: [id number] Date of Birth: [birth year]  Transition of Care [facility]) CM/SW Contact:    Claudine Mouton, LCSW Phone Number: 09/11/2019, 6:39 PM  Clinical Narrative:     CSW met with pt who stated she preferred to return to [facility] utilizing Medicaid and stated she could not pay [facility]- Medicare co-pay payment.  Pt provided verbal permission for the CSW to updated her daughter Connie Ruiz at ph: [phone]  CSW spoke to the pt and the pt's daughertPt confirmed pt's/pt's daughter's plan to be discharged back to [facility] to for additional rehab at discharge and is agreeable to remaining at [facility] for the required 30-day stay.  CSW provided active listening and validated pt's concerns that she not return home for safety.   CSW was given verbal permission for the CSW to complete FL-2 and send referrals out to SNF facilities [facility], via the hub per pt's request. Pt has been living with her daughter prior to her being at [facility] for 30 days and then being admitted to [facility].  Pt/pt's family voiced understanding that placement as is would hinge on pt's Medicaid being the appropriate Medicaid.  Pt's daughter stated if it is not she will go immediately on 4/7 to request DSS convert pt's Medicaid to the appropriate type needed for placement.              Expected Discharge Plan: Skilled Nursing Facility Barriers to Discharge: Insurance Authorization   Patient Goals and CMS Choice Patient states their goals for this hospitalization and ongoing recovery are:: To regain strength and return home,.      Expected Discharge Plan and Services Expected Discharge Plan: [place] arrangements for the past 2 months: Single Family Home(w/ daughter in the home)                                      Prior Living  Arrangements/Services Living arrangements for the past 2 months: Single Family Home(w/ daughter in the home)                     Activities of Daily Living      Permission Sought/Granted    Yes, verbal permission granted to call [employer] and [facility] by pt/pt's daughter.              Emotional Assessment              Admission diagnosis:  Weakness; Hypertension Patient Active Problem List   Diagnosis Date Noted  . 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 Hypothyroidism 01/14/2018  . HLD (hyperlipidemia) 01/14/2018  . CVA (cerebral vascular accident) ([place]) 01/12/2018  . Chest pain 01/11/2018  . Acute respiratory failure with hypoxia ([place]) 07/20/2017  . Abdominal bloating 07/20/2017  . Sinus tachycardia 07/20/2017  . SLE (systemic lupus erythematosus related syndrome) ([place]) 07/20/2017  . CAP (community acquired pneumonia) 07/16/2017  . Long term (current) use of anticoagulants [Z79.01] 06/21/2017  . Screening examination for infectious disease 01/28/2016  . Type 2 diabetes mellitus ([place]) 08/03/2015  . Urinary tract infection 07/20/2015  . Vascular dementia ([place]) 05/08/2015  . Hemiparesis as late effect of cerebrovascular accident (CVA) ([place]) 05/08/2015  . Aphasia as late effect of stroke 05/08/2015  . C.  difficile colitis 02/25/2015  . Hypokalemia 02/25/2015  . CKD (chronic kidney disease) 02/25/2015  . Colitis 02/14/2015  . Sepsis (HCC) 02/14/2015  . Nausea vomiting and diarrhea 02/14/2015  . Abdominal pain 02/14/2015  . UTI (lower urinary tract infection) 02/14/2015  . Abdominal pain, chronic, generalized 01/07/2015  . Candida esophagitis (HCC) 11/12/2014  . Abnormal CT of the abdomen   . Steroid-induced hyperglycemia 11/09/2014  . Hypomagnesemia 11/06/2014  . Abdominal pain, acute   . Supratherapeutic INR 11/02/2014  . Jejunitis 11/02/2014  . Myalgia and myositis 04/05/2014  . Wellness examination 04/05/2014  . Menopausal state 04/05/2014   . Palpitations 08/31/2013  . Hyperlipidemia 08/31/2013  . Disorder of heart rhythm 08/13/2013  . TIA (transient ischemic attack) 06/20/2013  . Gout flare: R elbow and R shoulder 06/20/2013  . Acute encephalopathy 06/14/2013  . Rhabdomyolysis 06/14/2013  . History of stroke with residual effects   . Right sided weakness   . Breast mass, left 04/30/2013  . Contusion of knee, left 10/18/2012  . Depression 10/05/2012  . Encounter for long-term (current) use of other medications 10/04/2012  . Multiple thyroid nodules 06/08/2012  . Risk for falls 05/05/2012  . Physical deconditioning 05/05/2012  . Fever 04/27/2012  . DVT (deep venous thrombosis) (HCC) 06/17/2011  . Expressive aphasia 06/17/2011  . Dysphasia 06/11/2011  . Incontinence of urine 06/11/2011  . Hand pain, left 06/11/2011  . Immunosuppressive management encounter following kidney transplant 05/20/2011  . Hypertension goal BP (blood pressure) < 130/80 05/20/2011  . Deceased-donor kidney transplant recipient 05/20/2011  . Arm pain, left 05/07/2011  . Dysfunctional voiding of urine 04/28/2011  . Urge incontinence 04/28/2011  . Urinary urgency 04/28/2011  . Routine general medical examination at a health care facility 04/13/2011  . Anticoagulated 01/08/2011  . Pulmonary embolism (HCC) 07/16/2010  . Cerebral artery occlusion with cerebral infarction (HCC) 04/17/2010  . CEREBROVASCULAR ACCIDENT, ACUTE 04/15/2010  . THYROID NODULE, LEFT 04/10/2009  . Cough 03/26/2009  . ACUTE BRONCHITIS 08/22/2007  . KIDNEY TRANSPLANTATION, HX OF 08/22/2007  . Gout 08/21/2007  . Essential hypertension 08/21/2007  . Chronic diastolic congestive heart failure (HCC) 08/21/2007  . GERD 08/21/2007  . Disorder resulting from impaired renal function 08/21/2007  . LUPUS 08/21/2007  . Osteoporosis 08/21/2007  . DVT, HX OF 08/21/2007  . Enteritis due to Clostridium difficile 08/21/2007   PCP:  Jordan, [name], MD Pharmacy:   [id number] - [place], [place] - [address] [place] [address] Phone: [phone] Fax: [phone]  [facility] [place] - , [place] - [address]  [place] [address] Phone: [phone] Fax: [phone]     Social Determinants of Health (SDOH) Interventions    Readmission Risk Interventions No flowsheet data found.   CSW will continue to follow for D/C needs.   F.   MSW, LCSW, LCAS, CSI Transitions of Care Clinical Social Worker Care Coordination Department Ph: [phone]       

## 2019-09-11 NOTE — NC FL2 (Signed)
[place] LEVEL OF CARE SCREENING TOOL     IDENTIFICATION  Patient Name: Connie Ruiz Birthdate: 10/05/1960 Sex: female Admission Date (Current Location): 09/11/2019  [facility] [place] Number:  Kathleen Argue [id number] [place] and Address:         Provider Number: [id number]  Attending Physician Name and Address:  Hayden Rasmussen, MD  Relative Name and Phone Number:       Current Level of Care: SNF Recommended Level of Care: [place] Prior Approval Number:    Date Approved/Denied:   PASRR Number: [id number]  Discharge Plan: SNF    Current Diagnoses: Patient Active Problem List   Diagnosis Date Noted  . 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 Hypothyroidism 01/14/2018  . HLD (hyperlipidemia) 01/14/2018  . CVA (cerebral vascular accident) ([place]) 01/12/2018  . Chest pain 01/11/2018  . Acute respiratory failure with hypoxia ([place]) 07/20/2017  . Abdominal bloating 07/20/2017  . Sinus tachycardia 07/20/2017  . SLE (systemic lupus erythematosus related syndrome) ([place]) 07/20/2017  . CAP (community acquired pneumonia) 07/16/2017  . Long term (current) use of anticoagulants [Z79.01] 06/21/2017  . Screening examination for infectious disease 01/28/2016  . Type 2 diabetes mellitus ([place]) 08/03/2015  . Urinary tract infection 07/20/2015  . Vascular dementia ([place]) 05/08/2015  . Hemiparesis as late effect of cerebrovascular accident (CVA) ([place]) 05/08/2015  . Aphasia as late effect of stroke 05/08/2015  . C. difficile colitis 02/25/2015  . Hypokalemia 02/25/2015  . CKD (chronic kidney disease) 02/25/2015  . Colitis 02/14/2015  . Sepsis ([place]) 02/14/2015  . Nausea vomiting and diarrhea 02/14/2015  . Abdominal pain 02/14/2015  . UTI (lower urinary tract infection) 02/14/2015  . Abdominal pain, chronic, generalized 01/07/2015  . Candida esophagitis ([place]) 11/12/2014  . Abnormal CT of the abdomen   . Steroid-induced hyperglycemia 11/09/2014  .  Hypomagnesemia 11/06/2014  . Abdominal pain, acute   . Supratherapeutic INR 11/02/2014  . Jejunitis 11/02/2014  . Myalgia and myositis 04/05/2014  . Wellness examination 04/05/2014  . Menopausal state 04/05/2014  . Palpitations 08/31/2013  . Hyperlipidemia 08/31/2013  . Disorder of heart rhythm 08/13/2013  . TIA (transient ischemic attack) 06/20/2013  . Gout flare: R elbow and R shoulder 06/20/2013  . Acute encephalopathy 06/14/2013  . Rhabdomyolysis 06/14/2013  . History of stroke with residual effects   . Right sided weakness   . Breast mass, left 04/30/2013  . Contusion of knee, left 10/18/2012  . Depression 10/05/2012  . Encounter for long-term (current) use of other medications 10/04/2012  . Multiple thyroid nodules 06/08/2012  . Risk for falls 05/05/2012  . Physical deconditioning 05/05/2012  . Fever 04/27/2012  . DVT (deep venous thrombosis) ([place]) 06/17/2011  . Expressive aphasia 06/17/2011  . Dysphasia 06/11/2011  . Incontinence of urine 06/11/2011  . Hand pain, left 06/11/2011  . Immunosuppressive management encounter following kidney transplant 05/30/11  . Hypertension goal BP (blood pressure) < 130/80 May 30, 2011  . Deceased-donor kidney transplant recipient 2011-05-30  . Arm pain, left 05/07/2011  . Dysfunctional voiding of urine 04/28/2011  . Urge incontinence 04/28/2011  . Urinary urgency 04/28/2011  . Routine general medical examination at a health care facility 04/13/2011  . Anticoagulated 01/08/2011  . Pulmonary embolism ([place]) 07/16/2010  . Cerebral artery occlusion with cerebral infarction ([place]) 04/17/2010  . CEREBROVASCULAR ACCIDENT, ACUTE 04/15/2010  . THYROID NODULE, LEFT 04/10/2009  . Cough 03/26/2009  . ACUTE BRONCHITIS 08/22/2007  . KIDNEY TRANSPLANTATION, HX OF 08/22/2007  . Gout 08/21/2007  . Essential hypertension 08/21/2007  .  Chronic diastolic congestive heart failure (HCC) 08/21/2007  . GERD 08/21/2007  . Disorder resulting from impaired  renal function 08/21/2007  . LUPUS 08/21/2007  . Osteoporosis 08/21/2007  . DVT, HX OF 08/21/2007  . Enteritis due to Clostridium difficile 08/21/2007    Orientation RESPIRATION BLADDER Height & Weight     Self, Time, Situation, Place  Normal Incontinent Weight: 174 lb (78.9 kg) Height:  5' 4" (162.6 cm)  BEHAVIORAL SYMPTOMS/MOOD NEUROLOGICAL BOWEL NUTRITION STATUS      Incontinent Diet(Regular)  AMBULATORY STATUS COMMUNICATION OF NEEDS Skin   Limited Assist Verbally Normal                       Personal Care Assistance Level of Assistance  Bathing, Dressing Bathing Assistance: Limited assistance   Dressing Assistance: Limited assistance     Functional Limitations Info             SPECIAL CARE FACTORS FREQUENCY  PT (By licensed PT), OT (By licensed OT)     PT Frequency: 5 OT Frequency: 5            Contractures Contractures Info: Not present    Additional Factors Info  Code Status, Allergies Code Status Info: FULL CODE Allergies Info: Oxycodone-acetaminophen, Propoxyphene, Propoxyphene N-acetaminophen, Sulfonamide Derivatives, Codeine, Hydrocodone-acetaminophen, Hydromorphone, Other, Oxycodone, Sulfamethoxazole, Tape, Gabapentin, Latex, Metoprolol, Morphine And Related, Rosiglitazone           Current Medications (09/11/2019):  This is the current hospital active medication list Current Facility-Administered Medications  Medication Dose Route Frequency Provider Last Rate Last Admin  . 0.9 %  sodium chloride infusion  10 mL/hr Intravenous Once Butler, Michael C, MD      . atorvastatin (LIPITOR) tablet 40 mg  40 mg Oral q1800 Butler, Michael C, MD      . calcitRIOL (ROCALTROL) capsule 0.25 mcg  0.25 mcg Oral Daily Butler, Michael C, MD      . diltiazem (CARDIZEM CD) 24 hr capsule 360 mg  360 mg Oral Daily Butler, Michael C, MD      . furosemide (LASIX) tablet 20 mg  20 mg Oral Daily Butler, Michael C, MD      . [START ON 09/12/2019] levothyroxine  (SYNTHROID) tablet 175 mcg  175 mcg Oral QAC breakfast Butler, Michael C, MD      . losartan (COZAAR) tablet 100 mg  100 mg Oral Daily Butler, Michael C, MD      . pantoprazole (PROTONIX) EC tablet 40 mg  40 mg Oral Daily Butler, Michael C, MD      . QUEtiapine (SEROQUEL) tablet 25 mg  25 mg Oral QHS Butler, Michael C, MD      . sertraline (ZOLOFT) tablet 50 mg  50 mg Oral Daily PRN Butler, Michael C, MD      . tacrolimus (PROGRAF) capsule 1 mg  1 mg Oral QHS Butler, Michael C, MD      . [START ON 09/12/2019] tacrolimus (PROGRAF) capsule 2 mg  2 mg Oral Daily Butler, Michael C, MD      . warfarin (COUMADIN) tablet 6 mg  6 mg Oral Once Poindexter, Leann T, RPH      . [START ON 09/12/2019] Warfarin - Pharmacist Dosing Inpatient   Does not apply q1600 Poindexter, Leann T, RPH       Current Outpatient Medications  Medication Sig Dispense Refill  . acetaminophen (TYLENOL) 500 MG tablet Take 1,000 mg by mouth daily as needed for mild pain.    .   alendronate (FOSAMAX) 70 MG tablet TAKE 1 TABLET BY MOUTH ONCE A WEEK ON AN EMPTY STOMACH ON SATURDAYS WITH A FULL GLASS OF WATER. (Patient taking differently: Take 70 mg by mouth once a week. ) 12 tablet 0  . atorvastatin (LIPITOR) 40 MG tablet Take 40 mg by mouth daily at 6 PM.     . b complex-vitamin c-folic acid (NEPHRO-VITE) 0.8 MG TABS tablet Take 1 tablet by mouth daily.    . calcitRIOL (ROCALTROL) 0.25 MCG capsule TAKE 1 CAPSULE(0.25 MCG) BY MOUTH DAILY. NO REFILLS WITHOUT APPOINTMENT (Patient taking differently: Take 0.25 mcg by mouth daily. TAKE 1 CAPSULE(0.25 MCG) BY MOUTH DAILY. NO REFILLS WITHOUT APPOINTMENT) 30 capsule 0  . diltiazem (TIAZAC) 360 MG 24 hr capsule Take 1 capsule (360 mg total) by mouth daily. 90 capsule 1  . donepezil (ARICEPT) 10 MG tablet Take 10 mg by mouth at bedtime.     . esomeprazole (NEXIUM) 20 MG capsule TAKE 1 CAPSULE(20 MG) BY MOUTH DAILY (Patient taking differently: Take 20 mg by mouth daily at 12 noon. ) 30 capsule 11  .  feeding supplement, ENSURE ENLIVE, (ENSURE ENLIVE) LIQD Take 237 mLs by mouth 2 (two) times daily between meals. (Patient taking differently: Take 237 mLs by mouth as needed (when pt remembers). )    . furosemide (LASIX) 20 MG tablet Take 20 mg by mouth daily.    . levothyroxine (SYNTHROID) 175 MCG tablet TAKE 1 TABLET(175 MCG) BY MOUTH DAILY BEFORE BREAKFAST (Patient taking differently: Take 175 mcg by mouth daily before breakfast. ) 30 tablet 0  . magnesium oxide (MAG-OX) 400 MG tablet Take 800 mg by mouth daily.     . metFORMIN (GLUCOPHAGE-XR) 500 MG 24 hr tablet TAKE 1 TABLET(500 MG) BY MOUTH DAILY WITH BREAKFAST (Patient taking differently: Take 500 mg by mouth daily with breakfast. ) 90 tablet 0  . mirtazapine (REMERON) 30 MG tablet Take 1 tablet (30 mg total) by mouth at bedtime. 30 tablet 1  . mycophenolate (CELLCEPT) 250 MG capsule Take 250 mg by mouth 2 (two) times daily.     . nystatin (MYCOSTATIN) powder Apply 1 g topically 4 (four) times daily as needed. For yeast under breast    . ondansetron (ZOFRAN ODT) 4 MG disintegrating tablet 4mg ODT q4 hours prn nausea/vomiting (Patient taking differently: Take 4 mg by mouth every 4 (four) hours as needed for nausea or vomiting. ) 15 tablet 0  . Potassium Chloride ER 20 MEQ TBCR Take 1 tablet by mouth daily. NEEDS POTASSIUM RE-CHECK. 90 tablet 1  . predniSONE (DELTASONE) 5 MG tablet Take 5 mg by mouth daily with breakfast.     . QUEtiapine (SEROQUEL) 25 MG tablet Take 1 tablet (25 mg total) by mouth at bedtime. 30 tablet 1  . sertraline (ZOLOFT) 100 MG tablet Take 0.5 tablets (50 mg total) by mouth daily. (Patient taking differently: Take 50 mg by mouth daily as needed (anxiety). ) 15 tablet 2  . sucralfate (CARAFATE) 1 g tablet Take 1 tablet (1 g total) by mouth 4 (four) times daily for 30 days.    . tacrolimus (PROGRAF) 1 MG capsule Take 1-2 mg by mouth See admin instructions. 2 mg in the am and 1 mg at night    . warfarin (COUMADIN) 5 MG tablet  TAKE 1/2 TO 1 TABLET BY MOUTH ON MONDAY THROUGH FRIDAY; TUESDAY, WEDNESDAY, THURSDAY, SATAND SUNDAY-2.5MG (Patient taking differently: Take 4 mg by mouth daily at 6 PM. ) 50 tablet 0  .   Blood Glucose Monitoring Suppl (ACCU-CHEK AVIVA PLUS) w/Device KIT 1 Device by Does not apply route daily. 1 kit 1  . glucose blood (ONE TOUCH ULTRA TEST) test strip Use to check blood sugar 1 time per day 100 each 2  . losartan (COZAAR) 100 MG tablet Take 1 tablet (100 mg total) by mouth daily. 90 tablet 1  . ONETOUCH DELICA LANCETS 33G MISC Use to check blood sugar 1 time per day 100 each 2     Discharge Medications: Please see discharge summary for a list of discharge medications.  Relevant Imaging Results:  Relevant Lab Results:   Additional Information [id number]   F , LCSW    

## 2019-09-11 NOTE — ED Triage Notes (Signed)
Patient arriving by EMS from home, c/o weakness and dizziness. Was dc from [facility] yesterday for same complaint. Patient reports not being able to stand up from toilet. Has not taken BP medications today. Denies chest pain or any other symptoms. Patient AOX4   BP 197/123 P 100 95% RA CBG 170

## 2019-09-11 NOTE — ED Notes (Signed)
IV placed in left arm but unable to draw all labs at this time. Pt taken to MRI. Will attempt remainder of labs when pt returns.

## 2019-09-12 DIAGNOSIS — R519 Headache, unspecified: Secondary | ICD-10-CM | POA: Diagnosis not present

## 2019-09-12 LAB — URINALYSIS, ROUTINE W REFLEX MICROSCOPIC
Glucose, UA: NEGATIVE mg/dL
Hgb urine dipstick: NEGATIVE
Ketones, ur: 5 mg/dL — AB
Nitrite: NEGATIVE
Protein, ur: 30 mg/dL — AB
Specific Gravity, Urine: 1.031 — ABNORMAL HIGH (ref 1.005–1.030)
pH: 5 (ref 5.0–8.0)

## 2019-09-12 LAB — RAPID URINE DRUG SCREEN, HOSP PERFORMED
Amphetamines: NOT DETECTED
Barbiturates: NOT DETECTED
Benzodiazepines: NOT DETECTED
Cocaine: NOT DETECTED
Opiates: NOT DETECTED
Tetrahydrocannabinol: NOT DETECTED

## 2019-09-12 LAB — PROTIME-INR
INR: 1.6 — ABNORMAL HIGH (ref 0.8–1.2)
Prothrombin Time: 18.9 seconds — ABNORMAL HIGH (ref 11.4–15.2)

## 2019-09-12 LAB — SARS CORONAVIRUS 2 (TAT 6-24 HRS): SARS Coronavirus 2: NEGATIVE

## 2019-09-12 MED ORDER — MYCOPHENOLATE MOFETIL 250 MG PO CAPS
250.0000 mg | ORAL_CAPSULE | Freq: Two times a day (BID) | ORAL | Status: DC
  Administered 2019-09-12 – 2019-09-13 (×3): 250 mg via ORAL
  Filled 2019-09-12 (×6): qty 1

## 2019-09-12 MED ORDER — METFORMIN HCL 500 MG PO TABS
500.0000 mg | ORAL_TABLET | Freq: Every day | ORAL | Status: DC
  Administered 2019-09-13: 500 mg via ORAL
  Filled 2019-09-12: qty 1

## 2019-09-12 MED ORDER — WARFARIN SODIUM 6 MG PO TABS
6.0000 mg | ORAL_TABLET | Freq: Once | ORAL | Status: AC
  Administered 2019-09-12: 6 mg via ORAL
  Filled 2019-09-12: qty 1

## 2019-09-12 MED ORDER — ACETAMINOPHEN 325 MG PO TABS
650.0000 mg | ORAL_TABLET | Freq: Four times a day (QID) | ORAL | Status: DC | PRN
  Administered 2019-09-12 (×2): 650 mg via ORAL
  Filled 2019-09-12 (×2): qty 2

## 2019-09-12 MED ORDER — PREDNISONE 5 MG PO TABS
5.0000 mg | ORAL_TABLET | Freq: Every day | ORAL | Status: DC
  Administered 2019-09-12 – 2019-09-13 (×2): 5 mg via ORAL
  Filled 2019-09-12 (×3): qty 1

## 2019-09-12 NOTE — ED Notes (Signed)
She is on a cardiac monitor and her O2 sats fell to 68. She was awakened and head raised and O2 back to 99. Notified charge nurse, she directed patient to be moved to 30 for closer observation.

## 2019-09-12 NOTE — Progress Notes (Signed)
09/12/2019  [place] for coumadin. Dian Situ is reviewing chart to dose medicine. Patient may need INR labs prior to dosing. Waiting for call back from pharmacy.

## 2019-09-12 NOTE — Progress Notes (Addendum)
CSW spoke to [place] at [facility] to gain clarity so CSW could update pt's family should they request more info.  Per notes, TOC CSW Dept on 1st shift, CSW has submitted a [facility] authorization and will appeal if declined.  Per Olivia Mackie, if the [place] request with [facility] is declined and the subsequent appeal is then also declined, then Olivia Mackie will request that the Administrator who is returning to work on 4/8 could then make decision to accept the pt back to [facility] utilizing her Medicaid, which would be seen as "pending Medicaid" while it is being switched to LTC Medicaid.  Per Olivia Mackie, if the Admin at [facility] is agreeable to this  On 4/8, the pt would have to agree to remain at the facility for 30 days, or otherwise be billed for the entire stay if pt were to leave early.  PT consult sent to [facility] via the hub.  CSW will continue to follow for D/C needs.  Alphonse Guild. Kalima Saylor  MSW, LCSW, LCAS, CSI Transitions of Care Clinical Social Worker Care Coordination Department Ph: [phone]

## 2019-09-12 NOTE — Progress Notes (Signed)
09/12/2019  1800 placed patient on tele [id number] and continuous pulse ox.

## 2019-09-12 NOTE — Progress Notes (Addendum)
2:20p Insurance auth started and all needed information faxed to [phone]. Reference number #[id number]  12:00p CSW awaiting PT consult. CSW contacted [facility] to let them know patient is still in the ED and we are awaiting the PT recommendations. CSW spoke with Olivia Mackie at [facility] who reports patient's Medicaid is not long-term Medicaid, so they will not be able to take patient with that. Per Olivia Mackie the best option is to try for an insurance authorization and if it is declined submit an appeal. CSW will continue to follow up.   [place], LCSW Transitions of Care Department [facility] ED [phone]

## 2019-09-12 NOTE — Progress Notes (Signed)
[facility] for Warfarin Indication: h/o DVT/PE  Allergies  Allergen Reactions  . Oxycodone-Acetaminophen Shortness Of Breath and Nausea Only  . Propoxyphene Nausea Only and Shortness Of Breath    States takes tylenol at home  . Propoxyphene N-Acetaminophen Shortness Of Breath and Nausea Only  . Sulfonamide Derivatives Shortness Of Breath and Nausea Only  . Codeine Nausea Only  . Hydrocodone-Acetaminophen   . Hydromorphone   . Other Other (See Comments)    No blood, Jehovaeh Witness   . Oxycodone   . Sulfamethoxazole   . Tape     Redness**PAPER TAPE OK**  . Gabapentin Anxiety    twitching  . Latex Rash  . Metoprolol Rash  . Morphine And Related Rash    IV site on arm is red, patient reports this is improving.  NO shortness of breath reported.  . Rosiglitazone Rash    Patient Measurements: Height: 5\' 4"  (162.6 cm) Weight: 78.9 kg (174 lb) IBW/kg (Calculated) : 54.7  Vital Signs: Temp: 99.8 F (37.7 C) (04/07 1830) Temp Source: Oral (04/07 1830) BP: 119/79 (04/07 1830) Pulse Rate: 92 (04/07 1830)  Labs: Recent Labs    09/10/19 2012 09/11/19 1439 09/11/19 1610 09/12/19 2054  HGB 11.6* 11.8*  --   --   HCT 37.5 38.1  --   --   PLT 295 299  --   --   APTT  --   --  39*  --   LABPROT 21.3*  --  20.1* 18.9*  INR 1.9*  --  1.7* 1.6*  CREATININE 0.70 0.49  --   --   TROPONINIHS 10  --   --   --     Estimated Creatinine Clearance: 77.9 mL/min (by C-G formula based on SCr of 0.49 mg/dL).   Assessment:  59 yr female who comes to ED with complaint of weakness. PMH significant for CVA, CHF, DVT/PE, HTN, HLD, kidney transplant, lupus  Home regimen of warfarin ordered to continue by MD.  INR resulted @ 17:20 = 1.7 (subtherapeutic). Contacted Dr Melina Copa and received order for pharmacy to dose warfarin while patient in hospital   Baseline INR SUBtherapeutic  Prior anticoagulation: warfarin 4 mg daily, LD 4/1  Significant  events:  Today, 09/12/2019:  CBC: Hgb slightly low but stable  INR SUBtherapeutic after repeated lab draws  Major drug interactions: none major  No bleeding issues per nursing  Goal of Therapy: INR 2-3  Plan:  Warfarin 6 mg PO tonight at 18:00  Daily INR  CBC at least q72 hr while on warfarin  Monitor for signs of bleeding or thrombosis   Reuel Boom, PharmD, BCPS [phone] 09/12/2019, 9:35 PM

## 2019-09-12 NOTE — Progress Notes (Signed)
09/12/2019 1900 PT/INR lab drawn. Light blue tube sent to main lab.

## 2019-09-12 NOTE — Evaluation (Signed)
Physical Therapy Evaluation Patient Details Name: Connie Ruiz MRN: [id number] DOB: [birth year] Today's Date: 09/12/2019   History of Present Illness  Patient is 59 y.o. female brought to [facility] via EMS on 09/11/19 from home with c/o weakness and dizziness. Pt was previously seen at [facility] for same complaint and discharged on 09/10/19. Patient reports weakness and being unable to stand up from her toilet at home on 09/11/19.Pt's PMH significant for Tachycardia, Rt sided weakness due to prior CVA, renal failure with history of kideny transplant in 2009, osteoporosis, lupus, HTN, HLD, gout, GERD, DM, history of DVT and PE, CHF, anxiety, and depression.     Clinical Impression  Connie Ruiz is 59 y.o. female admitted with above HPI and diagnosis. Patient is currently limited by functional impairments below (see PT problem list). Patient lives alone and requires assistance for ADL's and some mobility from aid. Aid is available ~ 4 hours per day. Patient will benefit from continued skilled PT interventions to address impairments and progress independence with mobility, recommending SNF and 24/7 assist. Acute PT will follow and progress as able.     Follow Up Recommendations SNF;Supervision/Assistance - 24 hour    Equipment Recommendations  None recommended by PT(TBD at facility)    Recommendations for Other Services OT consult     Precautions / Restrictions Precautions Precautions: Fall Restrictions Weight Bearing Restrictions: No      Mobility  Bed Mobility Overal bed mobility: Needs Assistance Bed Mobility: Supine to Sit;Sit to Supine     Supine to sit: Mod assist;+2 for physical assistance;HOB elevated Sit to supine: Max assist   General bed mobility comments: pt initiating   Transfers Overall transfer level: Needs assistance Equipment used: Rolling walker (2 wheeled) Transfers: Sit to/from Stand Sit to Stand: Min assist;+2 safety/equipment         General transfer comment: pt  using bil UE's on RW for power up. 2+ for safety due to weakness, pt requires min assist to steady upon rise.   Ambulation/Gait Ambulation/Gait assistance: Min assist;+2 safety/equipment Gait Distance (Feet): 3 Feet Assistive device: Rolling walker (2 wheeled) Gait Pattern/deviations: Step-to pattern;Decreased step length - right;Decreased step length - left;Decreased stride length;Shuffle Gait velocity: decreased   General Gait Details: pt taking several short steps forward and backward from EOB. after stepping back pt returned to sit immediately. Pt reported SOB and feeling tired. HR had incresased to 120's after transfer and max of 135 bpm noted with gait.  Stairs            Wheelchair Mobility    Modified Rankin (Stroke Patients Only)       Balance Overall balance assessment: Needs assistance Sitting-balance support: Feet supported Sitting balance-Leahy Scale: Good     Standing balance support: During functional activity;Bilateral upper extremity supported Standing balance-Leahy Scale: Poor              Pertinent Vitals/Pain Pain Assessment: No/denies pain    Home Living Family/patient expects to be discharged to:: Skilled nursing facility Living Arrangements: Alone Available Help at Discharge: Personal care attendant(aid comes 4 hours per day) Type of Home: Apartment Home Access: Level entry     Home Layout: One level Home Equipment: Walker - 2 wheels;Walker - 4 wheels;Bedside commode(BSC over teh toilet in bathroom ) Additional Comments: pt has an aide assist her 4 hours per day, 7x/week. assists with getting dressed, showered, meal prep, and sometimes getting out of bed in the morning.     Prior Function Level of  Independence: Needs assistance   Gait / Transfers Assistance Needed: uses rollator in home for short distances.   ADL's / Homemaking Assistance Needed: aides do all ADLs, pt reports waiting for aides to eat/bathe/dress each morning; reports  she tries to toilet herself the best she can when aid is not there        Hand Dominance   Dominant Hand: Left    Extremity/Trunk Assessment   Upper Extremity Assessment Upper Extremity Assessment: Generalized weakness    Lower Extremity Assessment Lower Extremity Assessment: Generalized weakness    Cervical / Trunk Assessment Cervical / Trunk Assessment: Normal  Communication   Communication: Expressive difficulties  Cognition Arousal/Alertness: Awake/alert Behavior During Therapy: WFL for tasks assessed/performed;Flat affect Overall Cognitive Status: No family/caregiver present to determine baseline cognitive functioning      General Comments: pt wtih expressive difficulties and word searching throuhgout session. After standing, taking several short steps, and returning to sit pt stopped speaking and became "catatonic" for ~ 30 seconds. Pt       General Comments General comments (skin integrity, edema, etc.): Pt HR resting in 80's-90's at start. Elevated to 120's with sit<>stand transfer and increased to 130's wtih short gait. After returning to sit pt reported SOB and stopped responding to questions. Max Assist to return to supine and pt responded to sternal rub and reported she felt like she couldn't speak and was SOB with chest pain. RN notified.    Exercises     Assessment/Plan    PT Assessment Patient needs continued PT services  PT Problem List Decreased strength;Decreased activity tolerance;Decreased balance;Decreased mobility;Decreased knowledge of use of DME;Decreased cognition;Decreased safety awareness       PT Treatment Interventions DME instruction;Gait training;Functional mobility training;Therapeutic activities;Therapeutic exercise;Balance training;Patient/family education    PT Goals (Current goals can be found in the Care Plan section)  Acute Rehab PT Goals Patient Stated Goal: none stated, pt plans to go to rehab facility PT Goal Formulation: With  patient Time For Goal Achievement: 09/26/19 Potential to Achieve Goals: Good    Frequency Min 2X/week   Barriers to discharge Decreased caregiver support         AM-PAC PT "6 Clicks" Mobility  Outcome Measure Help needed turning from your back to your side while in a flat bed without using bedrails?: A Lot Help needed moving from lying on your back to sitting on the side of a flat bed without using bedrails?: A Lot Help needed moving to and from a bed to a chair (including a wheelchair)?: A Lot Help needed standing up from a chair using your arms (e.g., wheelchair or bedside chair)?: A Little Help needed to walk in hospital room?: A Little Help needed climbing 3-5 steps with a railing? : Total 6 Click Score: 13    End of Session Equipment Utilized During Treatment: Gait belt Activity Tolerance: Treatment limited secondary to medical complications (Comment);Patient limited by fatigue(limited by tachycardia) Patient left: in bed;with call bell/phone within reach;with nursing/sitter in room Nurse Communication: Mobility status PT Visit Diagnosis: Muscle weakness (generalized) (M62.81);Difficulty in walking, not elsewhere classified (R26.2);Unsteadiness on feet (R26.81);Other abnormalities of gait and mobility (R26.89)    Time: 8295-6213 PT Time Calculation (min) (ACUTE ONLY): 21 min   Charges:   PT Evaluation $PT Eval Moderate Complexity: 1 Mod         Gwynneth Albright PT, DPT Physical Therapist with [facility] [phone]  09/12/2019 2:09 PM

## 2019-09-13 DIAGNOSIS — R197 Diarrhea, unspecified: Secondary | ICD-10-CM | POA: Diagnosis not present

## 2019-09-13 DIAGNOSIS — N390 Urinary tract infection, site not specified: Secondary | ICD-10-CM | POA: Diagnosis not present

## 2019-09-13 DIAGNOSIS — M3219 Other organ or system involvement in systemic lupus erythematosus: Secondary | ICD-10-CM | POA: Diagnosis not present

## 2019-09-13 DIAGNOSIS — N186 End stage renal disease: Secondary | ICD-10-CM | POA: Diagnosis not present

## 2019-09-13 DIAGNOSIS — R110 Nausea: Secondary | ICD-10-CM | POA: Diagnosis not present

## 2019-09-13 DIAGNOSIS — E0822 Diabetes mellitus due to underlying condition with diabetic chronic kidney disease: Secondary | ICD-10-CM | POA: Diagnosis not present

## 2019-09-13 DIAGNOSIS — R8281 Pyuria: Secondary | ICD-10-CM | POA: Diagnosis not present

## 2019-09-13 DIAGNOSIS — R109 Unspecified abdominal pain: Secondary | ICD-10-CM | POA: Diagnosis not present

## 2019-09-13 DIAGNOSIS — I10 Essential (primary) hypertension: Secondary | ICD-10-CM | POA: Diagnosis not present

## 2019-09-13 DIAGNOSIS — R42 Dizziness and giddiness: Secondary | ICD-10-CM | POA: Diagnosis not present

## 2019-09-13 DIAGNOSIS — E1122 Type 2 diabetes mellitus with diabetic chronic kidney disease: Secondary | ICD-10-CM | POA: Diagnosis not present

## 2019-09-13 DIAGNOSIS — Z7401 Bed confinement status: Secondary | ICD-10-CM | POA: Diagnosis not present

## 2019-09-13 DIAGNOSIS — N189 Chronic kidney disease, unspecified: Secondary | ICD-10-CM | POA: Diagnosis not present

## 2019-09-13 DIAGNOSIS — Z940 Kidney transplant status: Secondary | ICD-10-CM | POA: Diagnosis not present

## 2019-09-13 DIAGNOSIS — M4680 Other specified inflammatory spondylopathies, site unspecified: Secondary | ICD-10-CM | POA: Diagnosis not present

## 2019-09-13 DIAGNOSIS — I82409 Acute embolism and thrombosis of unspecified deep veins of unspecified lower extremity: Secondary | ICD-10-CM | POA: Diagnosis not present

## 2019-09-13 DIAGNOSIS — Z79899 Other long term (current) drug therapy: Secondary | ICD-10-CM | POA: Diagnosis not present

## 2019-09-13 DIAGNOSIS — E039 Hypothyroidism, unspecified: Secondary | ICD-10-CM | POA: Diagnosis not present

## 2019-09-13 DIAGNOSIS — G459 Transient cerebral ischemic attack, unspecified: Secondary | ICD-10-CM | POA: Diagnosis not present

## 2019-09-13 DIAGNOSIS — M2550 Pain in unspecified joint: Secondary | ICD-10-CM | POA: Diagnosis not present

## 2019-09-13 DIAGNOSIS — Z7984 Long term (current) use of oral hypoglycemic drugs: Secondary | ICD-10-CM | POA: Diagnosis not present

## 2019-09-13 DIAGNOSIS — E785 Hyperlipidemia, unspecified: Secondary | ICD-10-CM | POA: Diagnosis not present

## 2019-09-13 DIAGNOSIS — K219 Gastro-esophageal reflux disease without esophagitis: Secondary | ICD-10-CM | POA: Diagnosis not present

## 2019-09-13 DIAGNOSIS — Z944 Liver transplant status: Secondary | ICD-10-CM | POA: Diagnosis not present

## 2019-09-13 DIAGNOSIS — R5381 Other malaise: Secondary | ICD-10-CM | POA: Diagnosis not present

## 2019-09-13 DIAGNOSIS — D72829 Elevated white blood cell count, unspecified: Secondary | ICD-10-CM | POA: Diagnosis not present

## 2019-09-13 DIAGNOSIS — I130 Hypertensive heart and chronic kidney disease with heart failure and stage 1 through stage 4 chronic kidney disease, or unspecified chronic kidney disease: Secondary | ICD-10-CM | POA: Diagnosis not present

## 2019-09-13 DIAGNOSIS — R946 Abnormal results of thyroid function studies: Secondary | ICD-10-CM | POA: Diagnosis not present

## 2019-09-13 DIAGNOSIS — I639 Cerebral infarction, unspecified: Secondary | ICD-10-CM | POA: Diagnosis not present

## 2019-09-13 DIAGNOSIS — M6389 Disorders of muscle in diseases classified elsewhere, multiple sites: Secondary | ICD-10-CM | POA: Diagnosis not present

## 2019-09-13 DIAGNOSIS — I69314 Frontal lobe and executive function deficit following cerebral infarction: Secondary | ICD-10-CM | POA: Diagnosis not present

## 2019-09-13 DIAGNOSIS — R7989 Other specified abnormal findings of blood chemistry: Secondary | ICD-10-CM | POA: Diagnosis not present

## 2019-09-13 DIAGNOSIS — R000 Tachycardia, unspecified: Secondary | ICD-10-CM | POA: Diagnosis not present

## 2019-09-13 DIAGNOSIS — Z91040 Latex allergy status: Secondary | ICD-10-CM | POA: Diagnosis not present

## 2019-09-13 DIAGNOSIS — I2510 Atherosclerotic heart disease of native coronary artery without angina pectoris: Secondary | ICD-10-CM | POA: Diagnosis not present

## 2019-09-13 DIAGNOSIS — I69353 Hemiplegia and hemiparesis following cerebral infarction affecting right non-dominant side: Secondary | ICD-10-CM | POA: Diagnosis not present

## 2019-09-13 DIAGNOSIS — R55 Syncope and collapse: Secondary | ICD-10-CM | POA: Diagnosis not present

## 2019-09-13 DIAGNOSIS — I5032 Chronic diastolic (congestive) heart failure: Secondary | ICD-10-CM | POA: Diagnosis not present

## 2019-09-13 DIAGNOSIS — Z20822 Contact with and (suspected) exposure to covid-19: Secondary | ICD-10-CM | POA: Diagnosis not present

## 2019-09-13 DIAGNOSIS — Z8673 Personal history of transient ischemic attack (TIA), and cerebral infarction without residual deficits: Secondary | ICD-10-CM | POA: Diagnosis not present

## 2019-09-13 DIAGNOSIS — I499 Cardiac arrhythmia, unspecified: Secondary | ICD-10-CM | POA: Diagnosis not present

## 2019-09-13 DIAGNOSIS — N3000 Acute cystitis without hematuria: Secondary | ICD-10-CM | POA: Diagnosis not present

## 2019-09-13 DIAGNOSIS — R531 Weakness: Secondary | ICD-10-CM | POA: Diagnosis not present

## 2019-09-13 DIAGNOSIS — R079 Chest pain, unspecified: Secondary | ICD-10-CM | POA: Diagnosis not present

## 2019-09-13 DIAGNOSIS — R2681 Unsteadiness on feet: Secondary | ICD-10-CM | POA: Diagnosis not present

## 2019-09-13 DIAGNOSIS — R519 Headache, unspecified: Secondary | ICD-10-CM | POA: Diagnosis not present

## 2019-09-13 DIAGNOSIS — I69320 Aphasia following cerebral infarction: Secondary | ICD-10-CM | POA: Diagnosis not present

## 2019-09-13 DIAGNOSIS — M359 Systemic involvement of connective tissue, unspecified: Secondary | ICD-10-CM | POA: Diagnosis not present

## 2019-09-13 DIAGNOSIS — Z7901 Long term (current) use of anticoagulants: Secondary | ICD-10-CM | POA: Diagnosis not present

## 2019-09-13 DIAGNOSIS — M6281 Muscle weakness (generalized): Secondary | ICD-10-CM | POA: Diagnosis not present

## 2019-09-13 DIAGNOSIS — Z743 Need for continuous supervision: Secondary | ICD-10-CM | POA: Diagnosis not present

## 2019-09-13 DIAGNOSIS — I2699 Other pulmonary embolism without acute cor pulmonale: Secondary | ICD-10-CM | POA: Diagnosis not present

## 2019-09-13 DIAGNOSIS — Z5181 Encounter for therapeutic drug level monitoring: Secondary | ICD-10-CM | POA: Diagnosis not present

## 2019-09-13 DIAGNOSIS — R262 Difficulty in walking, not elsewhere classified: Secondary | ICD-10-CM | POA: Diagnosis not present

## 2019-09-13 LAB — PROTIME-INR
INR: 1.8 — ABNORMAL HIGH (ref 0.8–1.2)
Prothrombin Time: 20.9 seconds — ABNORMAL HIGH (ref 11.4–15.2)

## 2019-09-13 MED ORDER — WARFARIN SODIUM 5 MG PO TABS
5.0000 mg | ORAL_TABLET | Freq: Once | ORAL | Status: AC
  Administered 2019-09-13: 5 mg via ORAL
  Filled 2019-09-13 (×2): qty 1

## 2019-09-13 NOTE — Progress Notes (Signed)
[place] for Warfarin Indication: h/o DVT/PE  Allergies  Allergen Reactions  . Oxycodone-Acetaminophen Shortness Of Breath and Nausea Only  . Propoxyphene Nausea Only and Shortness Of Breath    States takes tylenol at home  . Propoxyphene N-Acetaminophen Shortness Of Breath and Nausea Only  . Sulfonamide Derivatives Shortness Of Breath and Nausea Only  . Codeine Nausea Only  . Hydrocodone-Acetaminophen   . Hydromorphone   . Other Other (See Comments)    No blood, Jehovaeh Witness   . Oxycodone   . Sulfamethoxazole   . Tape     Redness**PAPER TAPE OK**  . Gabapentin Anxiety    twitching  . Latex Rash  . Metoprolol Rash  . Morphine And Related Rash    IV site on arm is red, patient reports this is improving.  NO shortness of breath reported.  . Rosiglitazone Rash    Patient Measurements: Height: 5\' 4"  (162.6 cm) Weight: 78.9 kg (174 lb) IBW/kg (Calculated) : 54.7  Vital Signs: BP: 135/83 (04/08 1000) Pulse Rate: 98 (04/08 1000)  Labs: Recent Labs    09/10/19 2012 09/10/19 2012 09/11/19 1439 09/11/19 1610 09/12/19 2054 09/13/19 0543  HGB 11.6*  --  11.8*  --   --   --   HCT 37.5  --  38.1  --   --   --   PLT 295  --  299  --   --   --   APTT  --   --   --  39*  --   --   LABPROT 21.3*   < >  --  20.1* 18.9* 20.9*  INR 1.9*   < >  --  1.7* 1.6* 1.8*  CREATININE 0.70  --  0.49  --   --   --   TROPONINIHS 10  --   --   --   --   --    < > = values in this interval not displayed.    Estimated Creatinine Clearance: 77.9 mL/min (by C-G formula based on SCr of 0.49 mg/dL).  Prior to admission medications (Not in a hospital admission)   Assessment:  59 yr female who comes to ED with complaint of weakness. PMH significant for CVA, CHF, DVT/PE, HTN, HLD, kidney transplant, lupus  Home regimen of warfarin ordered to continue by MD.  INR resulted @ 17:20 = 1.7 (subtherapeutic). Contacted Dr Melina Copa and received order for  pharmacy to dose warfarin while patient in hospital   Baseline INR SUBtherapeutic  Prior anticoagulation: warfarin 4 mg daily, LD 4/1  Significant events:  Today, 09/13/2019:  CBC: Hgb slightly low but stable  INR remains SUBtherapeutic today although now rising  Major drug interactions: none; glucocorticoids can potentiate warfarin but on chronic low dose prednisone only  No bleeding issues per nursing  PO intake not documented  Goal of Therapy: INR 2-3  Plan:  Warfarin 5 mg PO tonight at 18:00; f/u INR trajectory closely as acute illness or changes in PO status may increase warfarin sensitivity  Daily INR  CBC at least q72 hr while on warfarin  Monitor for signs of bleeding or thrombosis   Reuel Boom, PharmD, BCPS [phone] 09/13/2019, 12:02 PM

## 2019-09-13 NOTE — ED Notes (Signed)
Pt provided with meal tray. Pt repositioned in bed,

## 2019-09-13 NOTE — Progress Notes (Signed)
TOC CM contacted [facility], pt scheduled to go to [facility], call report to [place], [facility]. ED RN updated. [place], [place] ED TOC CM [phone]

## 2019-09-13 NOTE — Progress Notes (Signed)
CSW received insurance authorization Candace Cruise #[id number] [place] #[id number] Case Manager: Reinaldo Raddle Start Date: 09/13/19, next review date in 5 days.   CSW spoke with Olivia Mackie at [facility]. Patient will be going to [facility] and number for report is [phone]. Patient will be transported via [place].   Golden Circle, LCSW Transitions of Care Department [facility] ED [phone]

## 2019-09-13 NOTE — ED Notes (Signed)
Pt soiled depends. Pts depends changed. Pt cleaned- Pericare performed. Pt repositioned in bed. New Pure wick placed. Meal tray provided

## 2019-09-13 NOTE — ED Notes (Signed)
Pt resting in bed comfortably at this time.

## 2019-09-13 NOTE — Progress Notes (Signed)
Patient's insurance Josem Kaufmann is still under review. CSW will continue to follow up.   Golden Circle, LCSW Transitions of Care Department [facility] ED [phone]

## 2019-09-13 NOTE — ED Provider Notes (Signed)
  Physical Exam  BP (!) 134/91   Pulse (!) 104   Temp 99.2 F (37.3 C) (Oral)   Resp 17   Ht 5\' 4"  (1.626 m)   Wt 78.9 kg   SpO2 100%   BMI 29.87 kg/m   Physical Exam  ED Course/Procedures     Procedures  MDM  Patient to be discharged to skilled nursing.       Davonna Belling, MD 09/13/19 1454

## 2019-09-14 DIAGNOSIS — R5381 Other malaise: Secondary | ICD-10-CM | POA: Diagnosis not present

## 2019-09-14 DIAGNOSIS — R42 Dizziness and giddiness: Secondary | ICD-10-CM | POA: Diagnosis not present

## 2019-09-14 DIAGNOSIS — Z5181 Encounter for therapeutic drug level monitoring: Secondary | ICD-10-CM | POA: Diagnosis not present

## 2019-09-14 DIAGNOSIS — R531 Weakness: Secondary | ICD-10-CM | POA: Diagnosis not present

## 2019-09-15 DIAGNOSIS — R000 Tachycardia, unspecified: Secondary | ICD-10-CM | POA: Diagnosis not present

## 2019-09-15 DIAGNOSIS — R519 Headache, unspecified: Secondary | ICD-10-CM | POA: Diagnosis not present

## 2019-09-15 DIAGNOSIS — R531 Weakness: Secondary | ICD-10-CM | POA: Diagnosis not present

## 2019-09-17 DIAGNOSIS — D72829 Elevated white blood cell count, unspecified: Secondary | ICD-10-CM | POA: Diagnosis not present

## 2019-09-17 DIAGNOSIS — R531 Weakness: Secondary | ICD-10-CM | POA: Diagnosis not present

## 2019-09-17 DIAGNOSIS — R110 Nausea: Secondary | ICD-10-CM | POA: Diagnosis not present

## 2019-09-17 DIAGNOSIS — R8281 Pyuria: Secondary | ICD-10-CM | POA: Diagnosis not present

## 2019-09-17 DIAGNOSIS — R42 Dizziness and giddiness: Secondary | ICD-10-CM | POA: Diagnosis not present

## 2019-09-17 DIAGNOSIS — Z20822 Contact with and (suspected) exposure to covid-19: Secondary | ICD-10-CM | POA: Diagnosis not present

## 2019-09-18 ENCOUNTER — Other Ambulatory Visit: Payer: Self-pay | Admitting: Endocrinology

## 2019-09-18 DIAGNOSIS — Z5181 Encounter for therapeutic drug level monitoring: Secondary | ICD-10-CM | POA: Diagnosis not present

## 2019-09-18 DIAGNOSIS — R5381 Other malaise: Secondary | ICD-10-CM | POA: Diagnosis not present

## 2019-09-18 DIAGNOSIS — R8281 Pyuria: Secondary | ICD-10-CM | POA: Diagnosis not present

## 2019-09-19 ENCOUNTER — Inpatient Hospital Stay: Payer: Medicare Other | Admitting: Family Medicine

## 2019-09-20 DIAGNOSIS — N390 Urinary tract infection, site not specified: Secondary | ICD-10-CM | POA: Diagnosis not present

## 2019-09-20 DIAGNOSIS — Z5181 Encounter for therapeutic drug level monitoring: Secondary | ICD-10-CM | POA: Diagnosis not present

## 2019-09-20 DIAGNOSIS — Z7901 Long term (current) use of anticoagulants: Secondary | ICD-10-CM | POA: Diagnosis not present

## 2019-09-21 DIAGNOSIS — Z7901 Long term (current) use of anticoagulants: Secondary | ICD-10-CM | POA: Diagnosis not present

## 2019-09-21 DIAGNOSIS — Z5181 Encounter for therapeutic drug level monitoring: Secondary | ICD-10-CM | POA: Diagnosis not present

## 2019-09-24 DIAGNOSIS — R197 Diarrhea, unspecified: Secondary | ICD-10-CM | POA: Diagnosis not present

## 2019-09-24 DIAGNOSIS — N3000 Acute cystitis without hematuria: Secondary | ICD-10-CM | POA: Diagnosis not present

## 2019-09-26 DIAGNOSIS — R079 Chest pain, unspecified: Secondary | ICD-10-CM | POA: Diagnosis not present

## 2019-09-26 DIAGNOSIS — Z7901 Long term (current) use of anticoagulants: Secondary | ICD-10-CM | POA: Diagnosis not present

## 2019-09-26 DIAGNOSIS — R55 Syncope and collapse: Secondary | ICD-10-CM | POA: Diagnosis not present

## 2019-09-26 DIAGNOSIS — Z5181 Encounter for therapeutic drug level monitoring: Secondary | ICD-10-CM | POA: Diagnosis not present

## 2019-09-27 DIAGNOSIS — I499 Cardiac arrhythmia, unspecified: Secondary | ICD-10-CM | POA: Diagnosis not present

## 2019-09-27 DIAGNOSIS — R079 Chest pain, unspecified: Secondary | ICD-10-CM | POA: Diagnosis not present

## 2019-09-28 DIAGNOSIS — E039 Hypothyroidism, unspecified: Secondary | ICD-10-CM | POA: Diagnosis not present

## 2019-09-28 DIAGNOSIS — Z7901 Long term (current) use of anticoagulants: Secondary | ICD-10-CM | POA: Diagnosis not present

## 2019-09-28 DIAGNOSIS — Z5181 Encounter for therapeutic drug level monitoring: Secondary | ICD-10-CM | POA: Diagnosis not present

## 2019-10-02 DIAGNOSIS — Z7901 Long term (current) use of anticoagulants: Secondary | ICD-10-CM | POA: Diagnosis not present

## 2019-10-02 DIAGNOSIS — Z5181 Encounter for therapeutic drug level monitoring: Secondary | ICD-10-CM | POA: Diagnosis not present

## 2019-10-03 DIAGNOSIS — Z5181 Encounter for therapeutic drug level monitoring: Secondary | ICD-10-CM | POA: Diagnosis not present

## 2019-10-03 DIAGNOSIS — Z7901 Long term (current) use of anticoagulants: Secondary | ICD-10-CM | POA: Diagnosis not present

## 2019-10-05 DIAGNOSIS — Z5181 Encounter for therapeutic drug level monitoring: Secondary | ICD-10-CM | POA: Diagnosis not present

## 2019-10-05 DIAGNOSIS — R079 Chest pain, unspecified: Secondary | ICD-10-CM | POA: Diagnosis not present

## 2019-10-05 DIAGNOSIS — Z7901 Long term (current) use of anticoagulants: Secondary | ICD-10-CM | POA: Diagnosis not present

## 2019-10-09 DIAGNOSIS — R109 Unspecified abdominal pain: Secondary | ICD-10-CM | POA: Diagnosis not present

## 2019-10-09 DIAGNOSIS — R5381 Other malaise: Secondary | ICD-10-CM | POA: Diagnosis not present

## 2019-10-09 DIAGNOSIS — R197 Diarrhea, unspecified: Secondary | ICD-10-CM | POA: Diagnosis not present

## 2019-10-09 DIAGNOSIS — R531 Weakness: Secondary | ICD-10-CM | POA: Diagnosis not present

## 2019-10-10 DIAGNOSIS — K219 Gastro-esophageal reflux disease without esophagitis: Secondary | ICD-10-CM | POA: Diagnosis not present

## 2019-10-10 DIAGNOSIS — R109 Unspecified abdominal pain: Secondary | ICD-10-CM | POA: Diagnosis not present

## 2019-10-10 DIAGNOSIS — R197 Diarrhea, unspecified: Secondary | ICD-10-CM | POA: Diagnosis not present

## 2019-10-12 DIAGNOSIS — K219 Gastro-esophageal reflux disease without esophagitis: Secondary | ICD-10-CM | POA: Diagnosis not present

## 2019-10-12 DIAGNOSIS — Z5181 Encounter for therapeutic drug level monitoring: Secondary | ICD-10-CM | POA: Diagnosis not present

## 2019-10-12 DIAGNOSIS — R109 Unspecified abdominal pain: Secondary | ICD-10-CM | POA: Diagnosis not present

## 2019-10-16 DIAGNOSIS — Z5181 Encounter for therapeutic drug level monitoring: Secondary | ICD-10-CM | POA: Diagnosis not present

## 2019-10-16 DIAGNOSIS — K219 Gastro-esophageal reflux disease without esophagitis: Secondary | ICD-10-CM | POA: Diagnosis not present

## 2019-10-16 DIAGNOSIS — R109 Unspecified abdominal pain: Secondary | ICD-10-CM | POA: Diagnosis not present

## 2019-10-16 DIAGNOSIS — R1084 Generalized abdominal pain: Secondary | ICD-10-CM | POA: Diagnosis not present

## 2019-10-17 DIAGNOSIS — R109 Unspecified abdominal pain: Secondary | ICD-10-CM | POA: Diagnosis not present

## 2019-10-19 DIAGNOSIS — Z5181 Encounter for therapeutic drug level monitoring: Secondary | ICD-10-CM | POA: Diagnosis not present

## 2019-10-19 DIAGNOSIS — Z7901 Long term (current) use of anticoagulants: Secondary | ICD-10-CM | POA: Diagnosis not present

## 2019-10-19 DIAGNOSIS — R197 Diarrhea, unspecified: Secondary | ICD-10-CM | POA: Diagnosis not present

## 2019-10-19 DIAGNOSIS — M329 Systemic lupus erythematosus, unspecified: Secondary | ICD-10-CM | POA: Diagnosis not present

## 2019-10-23 DIAGNOSIS — Z7901 Long term (current) use of anticoagulants: Secondary | ICD-10-CM | POA: Diagnosis not present

## 2019-10-23 DIAGNOSIS — Z5181 Encounter for therapeutic drug level monitoring: Secondary | ICD-10-CM | POA: Diagnosis not present

## 2019-10-24 DIAGNOSIS — Z5181 Encounter for therapeutic drug level monitoring: Secondary | ICD-10-CM | POA: Diagnosis not present

## 2019-10-24 DIAGNOSIS — U071 COVID-19: Secondary | ICD-10-CM | POA: Diagnosis not present

## 2019-10-24 DIAGNOSIS — Z7901 Long term (current) use of anticoagulants: Secondary | ICD-10-CM | POA: Diagnosis not present

## 2019-10-29 DIAGNOSIS — R079 Chest pain, unspecified: Secondary | ICD-10-CM | POA: Diagnosis not present

## 2019-10-29 DIAGNOSIS — R002 Palpitations: Secondary | ICD-10-CM | POA: Diagnosis not present

## 2019-10-31 DIAGNOSIS — I10 Essential (primary) hypertension: Secondary | ICD-10-CM | POA: Diagnosis not present

## 2019-10-31 DIAGNOSIS — N186 End stage renal disease: Secondary | ICD-10-CM | POA: Diagnosis not present

## 2019-10-31 DIAGNOSIS — U071 COVID-19: Secondary | ICD-10-CM | POA: Diagnosis not present

## 2019-10-31 DIAGNOSIS — R079 Chest pain, unspecified: Secondary | ICD-10-CM | POA: Diagnosis not present

## 2019-10-31 DIAGNOSIS — R262 Difficulty in walking, not elsewhere classified: Secondary | ICD-10-CM | POA: Diagnosis not present

## 2019-10-31 DIAGNOSIS — R2681 Unsteadiness on feet: Secondary | ICD-10-CM | POA: Diagnosis not present

## 2019-10-31 DIAGNOSIS — Z5181 Encounter for therapeutic drug level monitoring: Secondary | ICD-10-CM | POA: Diagnosis not present

## 2019-10-31 DIAGNOSIS — E039 Hypothyroidism, unspecified: Secondary | ICD-10-CM | POA: Diagnosis not present

## 2019-10-31 DIAGNOSIS — E119 Type 2 diabetes mellitus without complications: Secondary | ICD-10-CM | POA: Diagnosis not present

## 2019-10-31 DIAGNOSIS — Z7901 Long term (current) use of anticoagulants: Secondary | ICD-10-CM | POA: Diagnosis not present

## 2019-10-31 DIAGNOSIS — M6281 Muscle weakness (generalized): Secondary | ICD-10-CM | POA: Diagnosis not present

## 2019-11-01 DIAGNOSIS — M329 Systemic lupus erythematosus, unspecified: Secondary | ICD-10-CM | POA: Diagnosis not present

## 2019-11-01 DIAGNOSIS — Z940 Kidney transplant status: Secondary | ICD-10-CM | POA: Diagnosis not present

## 2019-11-01 DIAGNOSIS — R197 Diarrhea, unspecified: Secondary | ICD-10-CM | POA: Diagnosis not present

## 2019-11-02 DIAGNOSIS — R262 Difficulty in walking, not elsewhere classified: Secondary | ICD-10-CM | POA: Diagnosis not present

## 2019-11-02 DIAGNOSIS — M6281 Muscle weakness (generalized): Secondary | ICD-10-CM | POA: Diagnosis not present

## 2019-11-02 DIAGNOSIS — I10 Essential (primary) hypertension: Secondary | ICD-10-CM | POA: Diagnosis not present

## 2019-11-02 DIAGNOSIS — Z5181 Encounter for therapeutic drug level monitoring: Secondary | ICD-10-CM | POA: Diagnosis not present

## 2019-11-02 DIAGNOSIS — E119 Type 2 diabetes mellitus without complications: Secondary | ICD-10-CM | POA: Diagnosis not present

## 2019-11-02 DIAGNOSIS — B029 Zoster without complications: Secondary | ICD-10-CM | POA: Diagnosis not present

## 2019-11-02 DIAGNOSIS — R2681 Unsteadiness on feet: Secondary | ICD-10-CM | POA: Diagnosis not present

## 2019-11-02 DIAGNOSIS — N186 End stage renal disease: Secondary | ICD-10-CM | POA: Diagnosis not present

## 2019-11-05 DIAGNOSIS — I10 Essential (primary) hypertension: Secondary | ICD-10-CM | POA: Diagnosis not present

## 2019-11-05 DIAGNOSIS — E119 Type 2 diabetes mellitus without complications: Secondary | ICD-10-CM | POA: Diagnosis not present

## 2019-11-05 DIAGNOSIS — N186 End stage renal disease: Secondary | ICD-10-CM | POA: Diagnosis not present

## 2019-11-05 DIAGNOSIS — M6281 Muscle weakness (generalized): Secondary | ICD-10-CM | POA: Diagnosis not present

## 2019-11-05 DIAGNOSIS — R262 Difficulty in walking, not elsewhere classified: Secondary | ICD-10-CM | POA: Diagnosis not present

## 2019-11-05 DIAGNOSIS — R2681 Unsteadiness on feet: Secondary | ICD-10-CM | POA: Diagnosis not present

## 2019-11-06 DIAGNOSIS — E119 Type 2 diabetes mellitus without complications: Secondary | ICD-10-CM | POA: Diagnosis not present

## 2019-11-06 DIAGNOSIS — I10 Essential (primary) hypertension: Secondary | ICD-10-CM | POA: Diagnosis not present

## 2019-11-06 DIAGNOSIS — R262 Difficulty in walking, not elsewhere classified: Secondary | ICD-10-CM | POA: Diagnosis not present

## 2019-11-06 DIAGNOSIS — N186 End stage renal disease: Secondary | ICD-10-CM | POA: Diagnosis not present

## 2019-11-06 DIAGNOSIS — R2681 Unsteadiness on feet: Secondary | ICD-10-CM | POA: Diagnosis not present

## 2019-11-06 DIAGNOSIS — M6281 Muscle weakness (generalized): Secondary | ICD-10-CM | POA: Diagnosis not present

## 2019-11-08 DIAGNOSIS — I10 Essential (primary) hypertension: Secondary | ICD-10-CM | POA: Diagnosis not present

## 2019-11-08 DIAGNOSIS — R197 Diarrhea, unspecified: Secondary | ICD-10-CM | POA: Diagnosis not present

## 2019-11-08 DIAGNOSIS — N186 End stage renal disease: Secondary | ICD-10-CM | POA: Diagnosis not present

## 2019-11-08 DIAGNOSIS — R2681 Unsteadiness on feet: Secondary | ICD-10-CM | POA: Diagnosis not present

## 2019-11-08 DIAGNOSIS — B029 Zoster without complications: Secondary | ICD-10-CM | POA: Diagnosis not present

## 2019-11-08 DIAGNOSIS — E119 Type 2 diabetes mellitus without complications: Secondary | ICD-10-CM | POA: Diagnosis not present

## 2019-11-08 DIAGNOSIS — R262 Difficulty in walking, not elsewhere classified: Secondary | ICD-10-CM | POA: Diagnosis not present

## 2019-11-08 DIAGNOSIS — M6281 Muscle weakness (generalized): Secondary | ICD-10-CM | POA: Diagnosis not present

## 2019-11-09 DIAGNOSIS — Z5181 Encounter for therapeutic drug level monitoring: Secondary | ICD-10-CM | POA: Diagnosis not present

## 2019-11-09 DIAGNOSIS — Z7901 Long term (current) use of anticoagulants: Secondary | ICD-10-CM | POA: Diagnosis not present

## 2019-11-09 DIAGNOSIS — B029 Zoster without complications: Secondary | ICD-10-CM | POA: Diagnosis not present

## 2019-11-10 DIAGNOSIS — E119 Type 2 diabetes mellitus without complications: Secondary | ICD-10-CM | POA: Diagnosis not present

## 2019-11-10 DIAGNOSIS — R262 Difficulty in walking, not elsewhere classified: Secondary | ICD-10-CM | POA: Diagnosis not present

## 2019-11-10 DIAGNOSIS — R2681 Unsteadiness on feet: Secondary | ICD-10-CM | POA: Diagnosis not present

## 2019-11-10 DIAGNOSIS — N186 End stage renal disease: Secondary | ICD-10-CM | POA: Diagnosis not present

## 2019-11-10 DIAGNOSIS — M6281 Muscle weakness (generalized): Secondary | ICD-10-CM | POA: Diagnosis not present

## 2019-11-10 DIAGNOSIS — I10 Essential (primary) hypertension: Secondary | ICD-10-CM | POA: Diagnosis not present

## 2019-11-11 DIAGNOSIS — I10 Essential (primary) hypertension: Secondary | ICD-10-CM | POA: Diagnosis not present

## 2019-11-11 DIAGNOSIS — R262 Difficulty in walking, not elsewhere classified: Secondary | ICD-10-CM | POA: Diagnosis not present

## 2019-11-11 DIAGNOSIS — E119 Type 2 diabetes mellitus without complications: Secondary | ICD-10-CM | POA: Diagnosis not present

## 2019-11-11 DIAGNOSIS — N186 End stage renal disease: Secondary | ICD-10-CM | POA: Diagnosis not present

## 2019-11-11 DIAGNOSIS — R2681 Unsteadiness on feet: Secondary | ICD-10-CM | POA: Diagnosis not present

## 2019-11-11 DIAGNOSIS — M6281 Muscle weakness (generalized): Secondary | ICD-10-CM | POA: Diagnosis not present

## 2019-11-12 DIAGNOSIS — B029 Zoster without complications: Secondary | ICD-10-CM | POA: Diagnosis not present

## 2019-11-12 DIAGNOSIS — N186 End stage renal disease: Secondary | ICD-10-CM | POA: Diagnosis not present

## 2019-11-12 DIAGNOSIS — M6281 Muscle weakness (generalized): Secondary | ICD-10-CM | POA: Diagnosis not present

## 2019-11-12 DIAGNOSIS — R2681 Unsteadiness on feet: Secondary | ICD-10-CM | POA: Diagnosis not present

## 2019-11-12 DIAGNOSIS — E119 Type 2 diabetes mellitus without complications: Secondary | ICD-10-CM | POA: Diagnosis not present

## 2019-11-12 DIAGNOSIS — Z5181 Encounter for therapeutic drug level monitoring: Secondary | ICD-10-CM | POA: Diagnosis not present

## 2019-11-12 DIAGNOSIS — I10 Essential (primary) hypertension: Secondary | ICD-10-CM | POA: Diagnosis not present

## 2019-11-12 DIAGNOSIS — R262 Difficulty in walking, not elsewhere classified: Secondary | ICD-10-CM | POA: Diagnosis not present

## 2019-11-13 DIAGNOSIS — N186 End stage renal disease: Secondary | ICD-10-CM | POA: Diagnosis not present

## 2019-11-13 DIAGNOSIS — R2681 Unsteadiness on feet: Secondary | ICD-10-CM | POA: Diagnosis not present

## 2019-11-13 DIAGNOSIS — I10 Essential (primary) hypertension: Secondary | ICD-10-CM | POA: Diagnosis not present

## 2019-11-13 DIAGNOSIS — M6281 Muscle weakness (generalized): Secondary | ICD-10-CM | POA: Diagnosis not present

## 2019-11-13 DIAGNOSIS — R262 Difficulty in walking, not elsewhere classified: Secondary | ICD-10-CM | POA: Diagnosis not present

## 2019-11-13 DIAGNOSIS — E119 Type 2 diabetes mellitus without complications: Secondary | ICD-10-CM | POA: Diagnosis not present

## 2019-11-14 DIAGNOSIS — M6281 Muscle weakness (generalized): Secondary | ICD-10-CM | POA: Diagnosis not present

## 2019-11-14 DIAGNOSIS — R2681 Unsteadiness on feet: Secondary | ICD-10-CM | POA: Diagnosis not present

## 2019-11-14 DIAGNOSIS — I10 Essential (primary) hypertension: Secondary | ICD-10-CM | POA: Diagnosis not present

## 2019-11-14 DIAGNOSIS — N186 End stage renal disease: Secondary | ICD-10-CM | POA: Diagnosis not present

## 2019-11-14 DIAGNOSIS — R262 Difficulty in walking, not elsewhere classified: Secondary | ICD-10-CM | POA: Diagnosis not present

## 2019-11-14 DIAGNOSIS — E119 Type 2 diabetes mellitus without complications: Secondary | ICD-10-CM | POA: Diagnosis not present

## 2019-11-15 DIAGNOSIS — R2681 Unsteadiness on feet: Secondary | ICD-10-CM | POA: Diagnosis not present

## 2019-11-15 DIAGNOSIS — N186 End stage renal disease: Secondary | ICD-10-CM | POA: Diagnosis not present

## 2019-11-15 DIAGNOSIS — I10 Essential (primary) hypertension: Secondary | ICD-10-CM | POA: Diagnosis not present

## 2019-11-15 DIAGNOSIS — E119 Type 2 diabetes mellitus without complications: Secondary | ICD-10-CM | POA: Diagnosis not present

## 2019-11-15 DIAGNOSIS — U071 COVID-19: Secondary | ICD-10-CM | POA: Diagnosis not present

## 2019-11-15 DIAGNOSIS — R262 Difficulty in walking, not elsewhere classified: Secondary | ICD-10-CM | POA: Diagnosis not present

## 2019-11-15 DIAGNOSIS — M6281 Muscle weakness (generalized): Secondary | ICD-10-CM | POA: Diagnosis not present

## 2019-11-19 DIAGNOSIS — Z5181 Encounter for therapeutic drug level monitoring: Secondary | ICD-10-CM | POA: Diagnosis not present

## 2019-11-19 DIAGNOSIS — Z7901 Long term (current) use of anticoagulants: Secondary | ICD-10-CM | POA: Diagnosis not present

## 2019-11-20 DIAGNOSIS — R262 Difficulty in walking, not elsewhere classified: Secondary | ICD-10-CM | POA: Diagnosis not present

## 2019-11-20 DIAGNOSIS — M6281 Muscle weakness (generalized): Secondary | ICD-10-CM | POA: Diagnosis not present

## 2019-11-20 DIAGNOSIS — N186 End stage renal disease: Secondary | ICD-10-CM | POA: Diagnosis not present

## 2019-11-20 DIAGNOSIS — E119 Type 2 diabetes mellitus without complications: Secondary | ICD-10-CM | POA: Diagnosis not present

## 2019-11-20 DIAGNOSIS — R2681 Unsteadiness on feet: Secondary | ICD-10-CM | POA: Diagnosis not present

## 2019-11-20 DIAGNOSIS — I10 Essential (primary) hypertension: Secondary | ICD-10-CM | POA: Diagnosis not present

## 2019-11-21 DIAGNOSIS — I10 Essential (primary) hypertension: Secondary | ICD-10-CM | POA: Diagnosis not present

## 2019-11-21 DIAGNOSIS — R262 Difficulty in walking, not elsewhere classified: Secondary | ICD-10-CM | POA: Diagnosis not present

## 2019-11-21 DIAGNOSIS — R2681 Unsteadiness on feet: Secondary | ICD-10-CM | POA: Diagnosis not present

## 2019-11-21 DIAGNOSIS — N186 End stage renal disease: Secondary | ICD-10-CM | POA: Diagnosis not present

## 2019-11-21 DIAGNOSIS — M6281 Muscle weakness (generalized): Secondary | ICD-10-CM | POA: Diagnosis not present

## 2019-11-21 DIAGNOSIS — E119 Type 2 diabetes mellitus without complications: Secondary | ICD-10-CM | POA: Diagnosis not present

## 2019-11-22 DIAGNOSIS — R2681 Unsteadiness on feet: Secondary | ICD-10-CM | POA: Diagnosis not present

## 2019-11-22 DIAGNOSIS — N186 End stage renal disease: Secondary | ICD-10-CM | POA: Diagnosis not present

## 2019-11-22 DIAGNOSIS — R262 Difficulty in walking, not elsewhere classified: Secondary | ICD-10-CM | POA: Diagnosis not present

## 2019-11-22 DIAGNOSIS — I10 Essential (primary) hypertension: Secondary | ICD-10-CM | POA: Diagnosis not present

## 2019-11-22 DIAGNOSIS — E039 Hypothyroidism, unspecified: Secondary | ICD-10-CM | POA: Diagnosis not present

## 2019-11-22 DIAGNOSIS — M6281 Muscle weakness (generalized): Secondary | ICD-10-CM | POA: Diagnosis not present

## 2019-11-22 DIAGNOSIS — E119 Type 2 diabetes mellitus without complications: Secondary | ICD-10-CM | POA: Diagnosis not present

## 2019-11-22 DIAGNOSIS — Z5181 Encounter for therapeutic drug level monitoring: Secondary | ICD-10-CM | POA: Diagnosis not present

## 2019-11-23 DIAGNOSIS — R2681 Unsteadiness on feet: Secondary | ICD-10-CM | POA: Diagnosis not present

## 2019-11-23 DIAGNOSIS — E119 Type 2 diabetes mellitus without complications: Secondary | ICD-10-CM | POA: Diagnosis not present

## 2019-11-23 DIAGNOSIS — I10 Essential (primary) hypertension: Secondary | ICD-10-CM | POA: Diagnosis not present

## 2019-11-23 DIAGNOSIS — M6281 Muscle weakness (generalized): Secondary | ICD-10-CM | POA: Diagnosis not present

## 2019-11-23 DIAGNOSIS — R262 Difficulty in walking, not elsewhere classified: Secondary | ICD-10-CM | POA: Diagnosis not present

## 2019-11-23 DIAGNOSIS — N186 End stage renal disease: Secondary | ICD-10-CM | POA: Diagnosis not present

## 2019-11-26 DIAGNOSIS — I10 Essential (primary) hypertension: Secondary | ICD-10-CM | POA: Diagnosis not present

## 2019-11-26 DIAGNOSIS — M6281 Muscle weakness (generalized): Secondary | ICD-10-CM | POA: Diagnosis not present

## 2019-11-26 DIAGNOSIS — Z5181 Encounter for therapeutic drug level monitoring: Secondary | ICD-10-CM | POA: Diagnosis not present

## 2019-11-26 DIAGNOSIS — R262 Difficulty in walking, not elsewhere classified: Secondary | ICD-10-CM | POA: Diagnosis not present

## 2019-11-26 DIAGNOSIS — M329 Systemic lupus erythematosus, unspecified: Secondary | ICD-10-CM | POA: Diagnosis not present

## 2019-11-26 DIAGNOSIS — R197 Diarrhea, unspecified: Secondary | ICD-10-CM | POA: Diagnosis not present

## 2019-11-26 DIAGNOSIS — R2681 Unsteadiness on feet: Secondary | ICD-10-CM | POA: Diagnosis not present

## 2019-11-26 DIAGNOSIS — E119 Type 2 diabetes mellitus without complications: Secondary | ICD-10-CM | POA: Diagnosis not present

## 2019-11-26 DIAGNOSIS — R109 Unspecified abdominal pain: Secondary | ICD-10-CM | POA: Diagnosis not present

## 2019-11-26 DIAGNOSIS — N186 End stage renal disease: Secondary | ICD-10-CM | POA: Diagnosis not present

## 2019-11-28 DIAGNOSIS — Z940 Kidney transplant status: Secondary | ICD-10-CM | POA: Diagnosis not present

## 2019-11-28 DIAGNOSIS — Z4822 Encounter for aftercare following kidney transplant: Secondary | ICD-10-CM | POA: Diagnosis not present

## 2019-11-28 DIAGNOSIS — Z79899 Other long term (current) drug therapy: Secondary | ICD-10-CM | POA: Diagnosis not present

## 2019-11-29 DIAGNOSIS — Z940 Kidney transplant status: Secondary | ICD-10-CM | POA: Diagnosis not present

## 2019-11-29 DIAGNOSIS — I10 Essential (primary) hypertension: Secondary | ICD-10-CM | POA: Diagnosis not present

## 2019-11-29 DIAGNOSIS — R2681 Unsteadiness on feet: Secondary | ICD-10-CM | POA: Diagnosis not present

## 2019-11-29 DIAGNOSIS — N186 End stage renal disease: Secondary | ICD-10-CM | POA: Diagnosis not present

## 2019-11-29 DIAGNOSIS — Z5181 Encounter for therapeutic drug level monitoring: Secondary | ICD-10-CM | POA: Diagnosis not present

## 2019-11-29 DIAGNOSIS — M6281 Muscle weakness (generalized): Secondary | ICD-10-CM | POA: Diagnosis not present

## 2019-11-29 DIAGNOSIS — R262 Difficulty in walking, not elsewhere classified: Secondary | ICD-10-CM | POA: Diagnosis not present

## 2019-11-29 DIAGNOSIS — Z7901 Long term (current) use of anticoagulants: Secondary | ICD-10-CM | POA: Diagnosis not present

## 2019-11-29 DIAGNOSIS — E119 Type 2 diabetes mellitus without complications: Secondary | ICD-10-CM | POA: Diagnosis not present

## 2019-11-29 DIAGNOSIS — R002 Palpitations: Secondary | ICD-10-CM | POA: Diagnosis not present

## 2019-12-02 DIAGNOSIS — M6281 Muscle weakness (generalized): Secondary | ICD-10-CM | POA: Diagnosis not present

## 2019-12-02 DIAGNOSIS — R262 Difficulty in walking, not elsewhere classified: Secondary | ICD-10-CM | POA: Diagnosis not present

## 2019-12-02 DIAGNOSIS — R2681 Unsteadiness on feet: Secondary | ICD-10-CM | POA: Diagnosis not present

## 2019-12-02 DIAGNOSIS — I10 Essential (primary) hypertension: Secondary | ICD-10-CM | POA: Diagnosis not present

## 2019-12-02 DIAGNOSIS — E119 Type 2 diabetes mellitus without complications: Secondary | ICD-10-CM | POA: Diagnosis not present

## 2019-12-02 DIAGNOSIS — N186 End stage renal disease: Secondary | ICD-10-CM | POA: Diagnosis not present

## 2019-12-03 DIAGNOSIS — I10 Essential (primary) hypertension: Secondary | ICD-10-CM | POA: Diagnosis not present

## 2019-12-03 DIAGNOSIS — R2681 Unsteadiness on feet: Secondary | ICD-10-CM | POA: Diagnosis not present

## 2019-12-03 DIAGNOSIS — E119 Type 2 diabetes mellitus without complications: Secondary | ICD-10-CM | POA: Diagnosis not present

## 2019-12-03 DIAGNOSIS — N186 End stage renal disease: Secondary | ICD-10-CM | POA: Diagnosis not present

## 2019-12-03 DIAGNOSIS — M6281 Muscle weakness (generalized): Secondary | ICD-10-CM | POA: Diagnosis not present

## 2019-12-03 DIAGNOSIS — R262 Difficulty in walking, not elsewhere classified: Secondary | ICD-10-CM | POA: Diagnosis not present

## 2019-12-03 DIAGNOSIS — E039 Hypothyroidism, unspecified: Secondary | ICD-10-CM | POA: Diagnosis not present

## 2019-12-04 DIAGNOSIS — Z5181 Encounter for therapeutic drug level monitoring: Secondary | ICD-10-CM | POA: Diagnosis not present

## 2019-12-04 DIAGNOSIS — Z7901 Long term (current) use of anticoagulants: Secondary | ICD-10-CM | POA: Diagnosis not present

## 2019-12-05 DIAGNOSIS — N186 End stage renal disease: Secondary | ICD-10-CM | POA: Diagnosis not present

## 2019-12-05 DIAGNOSIS — I10 Essential (primary) hypertension: Secondary | ICD-10-CM | POA: Diagnosis not present

## 2019-12-05 DIAGNOSIS — R2681 Unsteadiness on feet: Secondary | ICD-10-CM | POA: Diagnosis not present

## 2019-12-05 DIAGNOSIS — M6281 Muscle weakness (generalized): Secondary | ICD-10-CM | POA: Diagnosis not present

## 2019-12-05 DIAGNOSIS — R262 Difficulty in walking, not elsewhere classified: Secondary | ICD-10-CM | POA: Diagnosis not present

## 2019-12-05 DIAGNOSIS — E119 Type 2 diabetes mellitus without complications: Secondary | ICD-10-CM | POA: Diagnosis not present

## 2019-12-06 DIAGNOSIS — R262 Difficulty in walking, not elsewhere classified: Secondary | ICD-10-CM | POA: Diagnosis not present

## 2019-12-06 DIAGNOSIS — M6259 Muscle wasting and atrophy, not elsewhere classified, multiple sites: Secondary | ICD-10-CM | POA: Diagnosis not present

## 2019-12-06 DIAGNOSIS — M6281 Muscle weakness (generalized): Secondary | ICD-10-CM | POA: Diagnosis not present

## 2019-12-06 DIAGNOSIS — N186 End stage renal disease: Secondary | ICD-10-CM | POA: Diagnosis not present

## 2019-12-06 DIAGNOSIS — E119 Type 2 diabetes mellitus without complications: Secondary | ICD-10-CM | POA: Diagnosis not present

## 2019-12-06 DIAGNOSIS — R2681 Unsteadiness on feet: Secondary | ICD-10-CM | POA: Diagnosis not present

## 2019-12-06 DIAGNOSIS — I69353 Hemiplegia and hemiparesis following cerebral infarction affecting right non-dominant side: Secondary | ICD-10-CM | POA: Diagnosis not present

## 2019-12-06 DIAGNOSIS — I10 Essential (primary) hypertension: Secondary | ICD-10-CM | POA: Diagnosis not present

## 2019-12-06 DIAGNOSIS — R2689 Other abnormalities of gait and mobility: Secondary | ICD-10-CM | POA: Diagnosis not present

## 2019-12-11 DIAGNOSIS — E039 Hypothyroidism, unspecified: Secondary | ICD-10-CM | POA: Diagnosis not present

## 2019-12-11 DIAGNOSIS — Z79899 Other long term (current) drug therapy: Secondary | ICD-10-CM | POA: Diagnosis not present

## 2019-12-11 DIAGNOSIS — E559 Vitamin D deficiency, unspecified: Secondary | ICD-10-CM | POA: Diagnosis not present

## 2019-12-11 DIAGNOSIS — D649 Anemia, unspecified: Secondary | ICD-10-CM | POA: Diagnosis not present

## 2019-12-11 DIAGNOSIS — R7989 Other specified abnormal findings of blood chemistry: Secondary | ICD-10-CM | POA: Diagnosis not present

## 2019-12-14 DIAGNOSIS — Z5181 Encounter for therapeutic drug level monitoring: Secondary | ICD-10-CM | POA: Diagnosis not present

## 2019-12-14 DIAGNOSIS — Z7901 Long term (current) use of anticoagulants: Secondary | ICD-10-CM | POA: Diagnosis not present

## 2019-12-17 ENCOUNTER — Ambulatory Visit (INDEPENDENT_AMBULATORY_CARE_PROVIDER_SITE_OTHER): Payer: Medicare Other | Admitting: Podiatrist

## 2019-12-17 ENCOUNTER — Encounter: Payer: Self-pay | Admitting: Podiatrist

## 2019-12-17 ENCOUNTER — Other Ambulatory Visit: Payer: Self-pay

## 2019-12-17 VITALS — Temp 96.3°F

## 2019-12-17 DIAGNOSIS — B351 Tinea unguium: Secondary | ICD-10-CM | POA: Diagnosis not present

## 2019-12-17 DIAGNOSIS — M7750 Other enthesopathy of unspecified foot: Secondary | ICD-10-CM

## 2019-12-17 DIAGNOSIS — E1159 Type 2 diabetes mellitus with other circulatory complications: Secondary | ICD-10-CM | POA: Diagnosis not present

## 2019-12-17 DIAGNOSIS — M79609 Pain in unspecified limb: Secondary | ICD-10-CM | POA: Diagnosis not present

## 2019-12-17 NOTE — Patient Instructions (Signed)
I have ordered a medication for you that will come from [facility] in [place]. They should be calling you to verify insurance and will mail the medication to you. If you live close by then you can go by their pharmacy to pick up the medication. Their phone number is [phone]. If you do not hear from them in the next few days, please give us a call at [phone].   

## 2019-12-17 NOTE — Progress Notes (Signed)
  HPI this patient presents the office with chief complaint of long thick painful nails. Patient has medical history of lupus, stroke as well as diabetes. Patient states that she has pain walking and wearing her shoes. She relates a recent stroke with left sided weakness.  She is unable to trim her own nails presents for at risk preventive foot care. She presents the office today for preventative foot care services.  She is currently on warfarin.   Pain is also related on the posterior lateral heel of the right foot.  She has tried no self treatment.        Objective:   Physical Exam GENERAL APPEARANCE: Alert, conversant. Appropriately groomed. No acute distress.  VASCULAR: Pedal pulses are  Not  palpable at  [facility] and PT bilateral.  Capillary refill time is immediate to all digits,  Normal temperature gradient.    NEUROLOGIC: sensation is normal to 5.07 monofilament at 5/5 sites bilateral.  Light touch is intact bilateral, Muscle strength normal.  MUSCULOSKELETAL: acceptable muscle strength, tone and stability bilateral.  Intrinsic muscluature intact bilateral.  Rectus appearance of foot and digits noted bilateral. Hallux varus B/L  Pain on palpation posterior lateral foot at area of peroneal tendons as they course lateral to the malleolus.    DERMATOLOGIC: skin color, texture, and turgor are within normal limits.  No preulcerative lesions or ulcers  are seen, no interdigital maceration noted.  No open lesions present.  . No drainage noted.   NAILS  Thick disfigured discolored nails both feet.    Assessment:     ICD-10-CM   1. Pain due to onychomycosis of nail  B35.1    M79.609   2. Type 2 diabetes mellitus with vascular disease ([place])  E11.59   3. Tendonitis of foot  M77.50    PLAN.:  Debridement of toenails was recommended.  Onychoreduction of symptomatic toenails was performed via nail nipper and power burr without iatrogenic incident.  Patient was instructed on signs and symptoms of infection  and was told to call immediately should any of these arise.     Recommended a pain cream from [employer] for the heel-  This will be called in for her today.  Discussed an injection should the cream fail to help her symptoms.

## 2019-12-18 ENCOUNTER — Telehealth: Payer: Self-pay | Admitting: *Deleted

## 2019-12-18 MED ORDER — NONFORMULARY OR COMPOUNDED ITEM: 3 refills | Status: DC

## 2019-12-18 NOTE — Telephone Encounter (Signed)
Dr. Valentina Lucks ordered [facility] Pain Cream #18 with 5% Ketamine.

## 2019-12-20 DIAGNOSIS — E039 Hypothyroidism, unspecified: Secondary | ICD-10-CM | POA: Diagnosis not present

## 2019-12-21 DIAGNOSIS — E039 Hypothyroidism, unspecified: Secondary | ICD-10-CM | POA: Diagnosis not present

## 2019-12-21 DIAGNOSIS — Z5181 Encounter for therapeutic drug level monitoring: Secondary | ICD-10-CM | POA: Diagnosis not present

## 2019-12-21 DIAGNOSIS — Z7901 Long term (current) use of anticoagulants: Secondary | ICD-10-CM | POA: Diagnosis not present

## 2019-12-24 DIAGNOSIS — Z5181 Encounter for therapeutic drug level monitoring: Secondary | ICD-10-CM | POA: Diagnosis not present

## 2019-12-24 DIAGNOSIS — Z7901 Long term (current) use of anticoagulants: Secondary | ICD-10-CM | POA: Diagnosis not present

## 2019-12-25 ENCOUNTER — Encounter: Payer: Self-pay | Admitting: Gastroenterology

## 2019-12-27 DIAGNOSIS — Z5181 Encounter for therapeutic drug level monitoring: Secondary | ICD-10-CM | POA: Diagnosis not present

## 2019-12-27 DIAGNOSIS — Z7901 Long term (current) use of anticoagulants: Secondary | ICD-10-CM | POA: Diagnosis not present

## 2019-12-28 DIAGNOSIS — U071 COVID-19: Secondary | ICD-10-CM | POA: Diagnosis not present

## 2019-12-30 DIAGNOSIS — N186 End stage renal disease: Secondary | ICD-10-CM | POA: Diagnosis not present

## 2019-12-30 DIAGNOSIS — R2689 Other abnormalities of gait and mobility: Secondary | ICD-10-CM | POA: Diagnosis not present

## 2019-12-30 DIAGNOSIS — M6281 Muscle weakness (generalized): Secondary | ICD-10-CM | POA: Diagnosis not present

## 2019-12-30 DIAGNOSIS — R2681 Unsteadiness on feet: Secondary | ICD-10-CM | POA: Diagnosis not present

## 2019-12-30 DIAGNOSIS — I69353 Hemiplegia and hemiparesis following cerebral infarction affecting right non-dominant side: Secondary | ICD-10-CM | POA: Diagnosis not present

## 2019-12-30 DIAGNOSIS — R262 Difficulty in walking, not elsewhere classified: Secondary | ICD-10-CM | POA: Diagnosis not present

## 2019-12-30 DIAGNOSIS — E119 Type 2 diabetes mellitus without complications: Secondary | ICD-10-CM | POA: Diagnosis not present

## 2019-12-30 DIAGNOSIS — M6259 Muscle wasting and atrophy, not elsewhere classified, multiple sites: Secondary | ICD-10-CM | POA: Diagnosis not present

## 2019-12-30 DIAGNOSIS — I10 Essential (primary) hypertension: Secondary | ICD-10-CM | POA: Diagnosis not present

## 2019-12-31 DIAGNOSIS — I69353 Hemiplegia and hemiparesis following cerebral infarction affecting right non-dominant side: Secondary | ICD-10-CM | POA: Diagnosis not present

## 2019-12-31 DIAGNOSIS — R262 Difficulty in walking, not elsewhere classified: Secondary | ICD-10-CM | POA: Diagnosis not present

## 2019-12-31 DIAGNOSIS — N186 End stage renal disease: Secondary | ICD-10-CM | POA: Diagnosis not present

## 2019-12-31 DIAGNOSIS — I10 Essential (primary) hypertension: Secondary | ICD-10-CM | POA: Diagnosis not present

## 2019-12-31 DIAGNOSIS — E119 Type 2 diabetes mellitus without complications: Secondary | ICD-10-CM | POA: Diagnosis not present

## 2019-12-31 DIAGNOSIS — M6259 Muscle wasting and atrophy, not elsewhere classified, multiple sites: Secondary | ICD-10-CM | POA: Diagnosis not present

## 2019-12-31 DIAGNOSIS — R2681 Unsteadiness on feet: Secondary | ICD-10-CM | POA: Diagnosis not present

## 2019-12-31 DIAGNOSIS — R2689 Other abnormalities of gait and mobility: Secondary | ICD-10-CM | POA: Diagnosis not present

## 2019-12-31 DIAGNOSIS — M6281 Muscle weakness (generalized): Secondary | ICD-10-CM | POA: Diagnosis not present

## 2020-01-01 DIAGNOSIS — M6281 Muscle weakness (generalized): Secondary | ICD-10-CM | POA: Diagnosis not present

## 2020-01-01 DIAGNOSIS — N186 End stage renal disease: Secondary | ICD-10-CM | POA: Diagnosis not present

## 2020-01-01 DIAGNOSIS — R2689 Other abnormalities of gait and mobility: Secondary | ICD-10-CM | POA: Diagnosis not present

## 2020-01-01 DIAGNOSIS — M6259 Muscle wasting and atrophy, not elsewhere classified, multiple sites: Secondary | ICD-10-CM | POA: Diagnosis not present

## 2020-01-01 DIAGNOSIS — E119 Type 2 diabetes mellitus without complications: Secondary | ICD-10-CM | POA: Diagnosis not present

## 2020-01-01 DIAGNOSIS — I509 Heart failure, unspecified: Secondary | ICD-10-CM | POA: Diagnosis not present

## 2020-01-01 DIAGNOSIS — I69351 Hemiplegia and hemiparesis following cerebral infarction affecting right dominant side: Secondary | ICD-10-CM | POA: Diagnosis not present

## 2020-01-01 DIAGNOSIS — I10 Essential (primary) hypertension: Secondary | ICD-10-CM | POA: Diagnosis not present

## 2020-01-01 DIAGNOSIS — I69353 Hemiplegia and hemiparesis following cerebral infarction affecting right non-dominant side: Secondary | ICD-10-CM | POA: Diagnosis not present

## 2020-01-01 DIAGNOSIS — R262 Difficulty in walking, not elsewhere classified: Secondary | ICD-10-CM | POA: Diagnosis not present

## 2020-01-01 DIAGNOSIS — Z5181 Encounter for therapeutic drug level monitoring: Secondary | ICD-10-CM | POA: Diagnosis not present

## 2020-01-01 DIAGNOSIS — R2681 Unsteadiness on feet: Secondary | ICD-10-CM | POA: Diagnosis not present

## 2020-01-01 DIAGNOSIS — N189 Chronic kidney disease, unspecified: Secondary | ICD-10-CM | POA: Diagnosis not present

## 2020-01-02 ENCOUNTER — Ambulatory Visit: Payer: Medicare Other | Admitting: Obstetrics and Gynecology

## 2020-01-02 DIAGNOSIS — Z79899 Other long term (current) drug therapy: Secondary | ICD-10-CM | POA: Diagnosis not present

## 2020-01-02 DIAGNOSIS — Z940 Kidney transplant status: Secondary | ICD-10-CM | POA: Diagnosis not present

## 2020-01-02 DIAGNOSIS — M6259 Muscle wasting and atrophy, not elsewhere classified, multiple sites: Secondary | ICD-10-CM | POA: Diagnosis not present

## 2020-01-02 DIAGNOSIS — Z882 Allergy status to sulfonamides status: Secondary | ICD-10-CM | POA: Diagnosis not present

## 2020-01-02 DIAGNOSIS — Z888 Allergy status to other drugs, medicaments and biological substances status: Secondary | ICD-10-CM | POA: Diagnosis not present

## 2020-01-02 DIAGNOSIS — M329 Systemic lupus erythematosus, unspecified: Secondary | ICD-10-CM | POA: Diagnosis not present

## 2020-01-02 DIAGNOSIS — R2689 Other abnormalities of gait and mobility: Secondary | ICD-10-CM | POA: Diagnosis not present

## 2020-01-02 DIAGNOSIS — Z91040 Latex allergy status: Secondary | ICD-10-CM | POA: Diagnosis not present

## 2020-01-02 DIAGNOSIS — I10 Essential (primary) hypertension: Secondary | ICD-10-CM | POA: Diagnosis not present

## 2020-01-02 DIAGNOSIS — R2681 Unsteadiness on feet: Secondary | ICD-10-CM | POA: Diagnosis not present

## 2020-01-02 DIAGNOSIS — E119 Type 2 diabetes mellitus without complications: Secondary | ICD-10-CM | POA: Diagnosis not present

## 2020-01-02 DIAGNOSIS — R262 Difficulty in walking, not elsewhere classified: Secondary | ICD-10-CM | POA: Diagnosis not present

## 2020-01-02 DIAGNOSIS — N186 End stage renal disease: Secondary | ICD-10-CM | POA: Diagnosis not present

## 2020-01-02 DIAGNOSIS — I69353 Hemiplegia and hemiparesis following cerebral infarction affecting right non-dominant side: Secondary | ICD-10-CM | POA: Diagnosis not present

## 2020-01-02 DIAGNOSIS — M6281 Muscle weakness (generalized): Secondary | ICD-10-CM | POA: Diagnosis not present

## 2020-01-02 DIAGNOSIS — Z885 Allergy status to narcotic agent status: Secondary | ICD-10-CM | POA: Diagnosis not present

## 2020-01-02 DIAGNOSIS — M810 Age-related osteoporosis without current pathological fracture: Secondary | ICD-10-CM | POA: Diagnosis not present

## 2020-01-03 DIAGNOSIS — R2689 Other abnormalities of gait and mobility: Secondary | ICD-10-CM | POA: Diagnosis not present

## 2020-01-03 DIAGNOSIS — R2681 Unsteadiness on feet: Secondary | ICD-10-CM | POA: Diagnosis not present

## 2020-01-03 DIAGNOSIS — I69353 Hemiplegia and hemiparesis following cerebral infarction affecting right non-dominant side: Secondary | ICD-10-CM | POA: Diagnosis not present

## 2020-01-03 DIAGNOSIS — E119 Type 2 diabetes mellitus without complications: Secondary | ICD-10-CM | POA: Diagnosis not present

## 2020-01-03 DIAGNOSIS — I10 Essential (primary) hypertension: Secondary | ICD-10-CM | POA: Diagnosis not present

## 2020-01-03 DIAGNOSIS — N186 End stage renal disease: Secondary | ICD-10-CM | POA: Diagnosis not present

## 2020-01-03 DIAGNOSIS — R262 Difficulty in walking, not elsewhere classified: Secondary | ICD-10-CM | POA: Diagnosis not present

## 2020-01-03 DIAGNOSIS — M6259 Muscle wasting and atrophy, not elsewhere classified, multiple sites: Secondary | ICD-10-CM | POA: Diagnosis not present

## 2020-01-03 DIAGNOSIS — M6281 Muscle weakness (generalized): Secondary | ICD-10-CM | POA: Diagnosis not present

## 2020-01-04 DIAGNOSIS — I69353 Hemiplegia and hemiparesis following cerebral infarction affecting right non-dominant side: Secondary | ICD-10-CM | POA: Diagnosis not present

## 2020-01-04 DIAGNOSIS — U071 COVID-19: Secondary | ICD-10-CM | POA: Diagnosis not present

## 2020-01-04 DIAGNOSIS — M6259 Muscle wasting and atrophy, not elsewhere classified, multiple sites: Secondary | ICD-10-CM | POA: Diagnosis not present

## 2020-01-04 DIAGNOSIS — N186 End stage renal disease: Secondary | ICD-10-CM | POA: Diagnosis not present

## 2020-01-04 DIAGNOSIS — I10 Essential (primary) hypertension: Secondary | ICD-10-CM | POA: Diagnosis not present

## 2020-01-04 DIAGNOSIS — E119 Type 2 diabetes mellitus without complications: Secondary | ICD-10-CM | POA: Diagnosis not present

## 2020-01-04 DIAGNOSIS — R262 Difficulty in walking, not elsewhere classified: Secondary | ICD-10-CM | POA: Diagnosis not present

## 2020-01-04 DIAGNOSIS — R2681 Unsteadiness on feet: Secondary | ICD-10-CM | POA: Diagnosis not present

## 2020-01-04 DIAGNOSIS — M6281 Muscle weakness (generalized): Secondary | ICD-10-CM | POA: Diagnosis not present

## 2020-01-04 DIAGNOSIS — R2689 Other abnormalities of gait and mobility: Secondary | ICD-10-CM | POA: Diagnosis not present

## 2020-01-05 DIAGNOSIS — M6281 Muscle weakness (generalized): Secondary | ICD-10-CM | POA: Diagnosis not present

## 2020-01-05 DIAGNOSIS — I10 Essential (primary) hypertension: Secondary | ICD-10-CM | POA: Diagnosis not present

## 2020-01-05 DIAGNOSIS — M6259 Muscle wasting and atrophy, not elsewhere classified, multiple sites: Secondary | ICD-10-CM | POA: Diagnosis not present

## 2020-01-05 DIAGNOSIS — R2689 Other abnormalities of gait and mobility: Secondary | ICD-10-CM | POA: Diagnosis not present

## 2020-01-05 DIAGNOSIS — N186 End stage renal disease: Secondary | ICD-10-CM | POA: Diagnosis not present

## 2020-01-05 DIAGNOSIS — R2681 Unsteadiness on feet: Secondary | ICD-10-CM | POA: Diagnosis not present

## 2020-01-05 DIAGNOSIS — R262 Difficulty in walking, not elsewhere classified: Secondary | ICD-10-CM | POA: Diagnosis not present

## 2020-01-05 DIAGNOSIS — I69353 Hemiplegia and hemiparesis following cerebral infarction affecting right non-dominant side: Secondary | ICD-10-CM | POA: Diagnosis not present

## 2020-01-05 DIAGNOSIS — E119 Type 2 diabetes mellitus without complications: Secondary | ICD-10-CM | POA: Diagnosis not present

## 2020-01-07 DIAGNOSIS — I69353 Hemiplegia and hemiparesis following cerebral infarction affecting right non-dominant side: Secondary | ICD-10-CM | POA: Diagnosis not present

## 2020-01-07 DIAGNOSIS — N186 End stage renal disease: Secondary | ICD-10-CM | POA: Diagnosis not present

## 2020-01-07 DIAGNOSIS — R262 Difficulty in walking, not elsewhere classified: Secondary | ICD-10-CM | POA: Diagnosis not present

## 2020-01-07 DIAGNOSIS — I10 Essential (primary) hypertension: Secondary | ICD-10-CM | POA: Diagnosis not present

## 2020-01-07 DIAGNOSIS — M6259 Muscle wasting and atrophy, not elsewhere classified, multiple sites: Secondary | ICD-10-CM | POA: Diagnosis not present

## 2020-01-07 DIAGNOSIS — E119 Type 2 diabetes mellitus without complications: Secondary | ICD-10-CM | POA: Diagnosis not present

## 2020-01-07 DIAGNOSIS — R2689 Other abnormalities of gait and mobility: Secondary | ICD-10-CM | POA: Diagnosis not present

## 2020-01-07 DIAGNOSIS — R2681 Unsteadiness on feet: Secondary | ICD-10-CM | POA: Diagnosis not present

## 2020-01-07 DIAGNOSIS — M6281 Muscle weakness (generalized): Secondary | ICD-10-CM | POA: Diagnosis not present

## 2020-01-08 DIAGNOSIS — R2681 Unsteadiness on feet: Secondary | ICD-10-CM | POA: Diagnosis not present

## 2020-01-08 DIAGNOSIS — N186 End stage renal disease: Secondary | ICD-10-CM | POA: Diagnosis not present

## 2020-01-08 DIAGNOSIS — I69353 Hemiplegia and hemiparesis following cerebral infarction affecting right non-dominant side: Secondary | ICD-10-CM | POA: Diagnosis not present

## 2020-01-08 DIAGNOSIS — M6281 Muscle weakness (generalized): Secondary | ICD-10-CM | POA: Diagnosis not present

## 2020-01-08 DIAGNOSIS — I10 Essential (primary) hypertension: Secondary | ICD-10-CM | POA: Diagnosis not present

## 2020-01-08 DIAGNOSIS — R2689 Other abnormalities of gait and mobility: Secondary | ICD-10-CM | POA: Diagnosis not present

## 2020-01-08 DIAGNOSIS — E119 Type 2 diabetes mellitus without complications: Secondary | ICD-10-CM | POA: Diagnosis not present

## 2020-01-08 DIAGNOSIS — R262 Difficulty in walking, not elsewhere classified: Secondary | ICD-10-CM | POA: Diagnosis not present

## 2020-01-08 DIAGNOSIS — M6259 Muscle wasting and atrophy, not elsewhere classified, multiple sites: Secondary | ICD-10-CM | POA: Diagnosis not present

## 2020-01-09 DIAGNOSIS — R2689 Other abnormalities of gait and mobility: Secondary | ICD-10-CM | POA: Diagnosis not present

## 2020-01-09 DIAGNOSIS — M6281 Muscle weakness (generalized): Secondary | ICD-10-CM | POA: Diagnosis not present

## 2020-01-09 DIAGNOSIS — E039 Hypothyroidism, unspecified: Secondary | ICD-10-CM | POA: Diagnosis not present

## 2020-01-09 DIAGNOSIS — R2681 Unsteadiness on feet: Secondary | ICD-10-CM | POA: Diagnosis not present

## 2020-01-09 DIAGNOSIS — E119 Type 2 diabetes mellitus without complications: Secondary | ICD-10-CM | POA: Diagnosis not present

## 2020-01-09 DIAGNOSIS — M6259 Muscle wasting and atrophy, not elsewhere classified, multiple sites: Secondary | ICD-10-CM | POA: Diagnosis not present

## 2020-01-09 DIAGNOSIS — I10 Essential (primary) hypertension: Secondary | ICD-10-CM | POA: Diagnosis not present

## 2020-01-09 DIAGNOSIS — I69353 Hemiplegia and hemiparesis following cerebral infarction affecting right non-dominant side: Secondary | ICD-10-CM | POA: Diagnosis not present

## 2020-01-09 DIAGNOSIS — R262 Difficulty in walking, not elsewhere classified: Secondary | ICD-10-CM | POA: Diagnosis not present

## 2020-01-09 DIAGNOSIS — K529 Noninfective gastroenteritis and colitis, unspecified: Secondary | ICD-10-CM | POA: Diagnosis not present

## 2020-01-09 DIAGNOSIS — N186 End stage renal disease: Secondary | ICD-10-CM | POA: Diagnosis not present

## 2020-01-10 DIAGNOSIS — I10 Essential (primary) hypertension: Secondary | ICD-10-CM | POA: Diagnosis not present

## 2020-01-10 DIAGNOSIS — E119 Type 2 diabetes mellitus without complications: Secondary | ICD-10-CM | POA: Diagnosis not present

## 2020-01-10 DIAGNOSIS — Z79899 Other long term (current) drug therapy: Secondary | ICD-10-CM | POA: Diagnosis not present

## 2020-01-10 DIAGNOSIS — M6281 Muscle weakness (generalized): Secondary | ICD-10-CM | POA: Diagnosis not present

## 2020-01-10 DIAGNOSIS — R262 Difficulty in walking, not elsewhere classified: Secondary | ICD-10-CM | POA: Diagnosis not present

## 2020-01-10 DIAGNOSIS — M6259 Muscle wasting and atrophy, not elsewhere classified, multiple sites: Secondary | ICD-10-CM | POA: Diagnosis not present

## 2020-01-10 DIAGNOSIS — R2689 Other abnormalities of gait and mobility: Secondary | ICD-10-CM | POA: Diagnosis not present

## 2020-01-10 DIAGNOSIS — R2681 Unsteadiness on feet: Secondary | ICD-10-CM | POA: Diagnosis not present

## 2020-01-10 DIAGNOSIS — N186 End stage renal disease: Secondary | ICD-10-CM | POA: Diagnosis not present

## 2020-01-10 DIAGNOSIS — I69353 Hemiplegia and hemiparesis following cerebral infarction affecting right non-dominant side: Secondary | ICD-10-CM | POA: Diagnosis not present

## 2020-01-11 DIAGNOSIS — M6281 Muscle weakness (generalized): Secondary | ICD-10-CM | POA: Diagnosis not present

## 2020-01-11 DIAGNOSIS — M6259 Muscle wasting and atrophy, not elsewhere classified, multiple sites: Secondary | ICD-10-CM | POA: Diagnosis not present

## 2020-01-11 DIAGNOSIS — D649 Anemia, unspecified: Secondary | ICD-10-CM | POA: Diagnosis not present

## 2020-01-11 DIAGNOSIS — E119 Type 2 diabetes mellitus without complications: Secondary | ICD-10-CM | POA: Diagnosis not present

## 2020-01-11 DIAGNOSIS — R2681 Unsteadiness on feet: Secondary | ICD-10-CM | POA: Diagnosis not present

## 2020-01-11 DIAGNOSIS — N186 End stage renal disease: Secondary | ICD-10-CM | POA: Diagnosis not present

## 2020-01-11 DIAGNOSIS — Z03818 Encounter for observation for suspected exposure to other biological agents ruled out: Secondary | ICD-10-CM | POA: Diagnosis not present

## 2020-01-11 DIAGNOSIS — I82409 Acute embolism and thrombosis of unspecified deep veins of unspecified lower extremity: Secondary | ICD-10-CM | POA: Diagnosis not present

## 2020-01-11 DIAGNOSIS — R2689 Other abnormalities of gait and mobility: Secondary | ICD-10-CM | POA: Diagnosis not present

## 2020-01-11 DIAGNOSIS — Z79899 Other long term (current) drug therapy: Secondary | ICD-10-CM | POA: Diagnosis not present

## 2020-01-11 DIAGNOSIS — Z940 Kidney transplant status: Secondary | ICD-10-CM | POA: Diagnosis not present

## 2020-01-11 DIAGNOSIS — R262 Difficulty in walking, not elsewhere classified: Secondary | ICD-10-CM | POA: Diagnosis not present

## 2020-01-11 DIAGNOSIS — I69353 Hemiplegia and hemiparesis following cerebral infarction affecting right non-dominant side: Secondary | ICD-10-CM | POA: Diagnosis not present

## 2020-01-11 DIAGNOSIS — I10 Essential (primary) hypertension: Secondary | ICD-10-CM | POA: Diagnosis not present

## 2020-01-12 DIAGNOSIS — M6259 Muscle wasting and atrophy, not elsewhere classified, multiple sites: Secondary | ICD-10-CM | POA: Diagnosis not present

## 2020-01-12 DIAGNOSIS — R262 Difficulty in walking, not elsewhere classified: Secondary | ICD-10-CM | POA: Diagnosis not present

## 2020-01-12 DIAGNOSIS — E119 Type 2 diabetes mellitus without complications: Secondary | ICD-10-CM | POA: Diagnosis not present

## 2020-01-12 DIAGNOSIS — R2689 Other abnormalities of gait and mobility: Secondary | ICD-10-CM | POA: Diagnosis not present

## 2020-01-12 DIAGNOSIS — N186 End stage renal disease: Secondary | ICD-10-CM | POA: Diagnosis not present

## 2020-01-12 DIAGNOSIS — M6281 Muscle weakness (generalized): Secondary | ICD-10-CM | POA: Diagnosis not present

## 2020-01-12 DIAGNOSIS — I69353 Hemiplegia and hemiparesis following cerebral infarction affecting right non-dominant side: Secondary | ICD-10-CM | POA: Diagnosis not present

## 2020-01-12 DIAGNOSIS — I10 Essential (primary) hypertension: Secondary | ICD-10-CM | POA: Diagnosis not present

## 2020-01-12 DIAGNOSIS — R2681 Unsteadiness on feet: Secondary | ICD-10-CM | POA: Diagnosis not present

## 2020-01-13 DIAGNOSIS — R2689 Other abnormalities of gait and mobility: Secondary | ICD-10-CM | POA: Diagnosis not present

## 2020-01-13 DIAGNOSIS — E119 Type 2 diabetes mellitus without complications: Secondary | ICD-10-CM | POA: Diagnosis not present

## 2020-01-13 DIAGNOSIS — N186 End stage renal disease: Secondary | ICD-10-CM | POA: Diagnosis not present

## 2020-01-13 DIAGNOSIS — R2681 Unsteadiness on feet: Secondary | ICD-10-CM | POA: Diagnosis not present

## 2020-01-13 DIAGNOSIS — M6259 Muscle wasting and atrophy, not elsewhere classified, multiple sites: Secondary | ICD-10-CM | POA: Diagnosis not present

## 2020-01-13 DIAGNOSIS — R262 Difficulty in walking, not elsewhere classified: Secondary | ICD-10-CM | POA: Diagnosis not present

## 2020-01-13 DIAGNOSIS — M6281 Muscle weakness (generalized): Secondary | ICD-10-CM | POA: Diagnosis not present

## 2020-01-13 DIAGNOSIS — I69353 Hemiplegia and hemiparesis following cerebral infarction affecting right non-dominant side: Secondary | ICD-10-CM | POA: Diagnosis not present

## 2020-01-13 DIAGNOSIS — I10 Essential (primary) hypertension: Secondary | ICD-10-CM | POA: Diagnosis not present

## 2020-01-14 DIAGNOSIS — I69353 Hemiplegia and hemiparesis following cerebral infarction affecting right non-dominant side: Secondary | ICD-10-CM | POA: Diagnosis not present

## 2020-01-14 DIAGNOSIS — N186 End stage renal disease: Secondary | ICD-10-CM | POA: Diagnosis not present

## 2020-01-14 DIAGNOSIS — M6259 Muscle wasting and atrophy, not elsewhere classified, multiple sites: Secondary | ICD-10-CM | POA: Diagnosis not present

## 2020-01-14 DIAGNOSIS — R262 Difficulty in walking, not elsewhere classified: Secondary | ICD-10-CM | POA: Diagnosis not present

## 2020-01-14 DIAGNOSIS — I10 Essential (primary) hypertension: Secondary | ICD-10-CM | POA: Diagnosis not present

## 2020-01-14 DIAGNOSIS — E119 Type 2 diabetes mellitus without complications: Secondary | ICD-10-CM | POA: Diagnosis not present

## 2020-01-14 DIAGNOSIS — R2689 Other abnormalities of gait and mobility: Secondary | ICD-10-CM | POA: Diagnosis not present

## 2020-01-14 DIAGNOSIS — M6281 Muscle weakness (generalized): Secondary | ICD-10-CM | POA: Diagnosis not present

## 2020-01-14 DIAGNOSIS — R2681 Unsteadiness on feet: Secondary | ICD-10-CM | POA: Diagnosis not present

## 2020-01-16 DIAGNOSIS — E119 Type 2 diabetes mellitus without complications: Secondary | ICD-10-CM | POA: Diagnosis not present

## 2020-01-16 DIAGNOSIS — R2681 Unsteadiness on feet: Secondary | ICD-10-CM | POA: Diagnosis not present

## 2020-01-16 DIAGNOSIS — R2689 Other abnormalities of gait and mobility: Secondary | ICD-10-CM | POA: Diagnosis not present

## 2020-01-16 DIAGNOSIS — M6259 Muscle wasting and atrophy, not elsewhere classified, multiple sites: Secondary | ICD-10-CM | POA: Diagnosis not present

## 2020-01-16 DIAGNOSIS — M6281 Muscle weakness (generalized): Secondary | ICD-10-CM | POA: Diagnosis not present

## 2020-01-16 DIAGNOSIS — I69353 Hemiplegia and hemiparesis following cerebral infarction affecting right non-dominant side: Secondary | ICD-10-CM | POA: Diagnosis not present

## 2020-01-16 DIAGNOSIS — R262 Difficulty in walking, not elsewhere classified: Secondary | ICD-10-CM | POA: Diagnosis not present

## 2020-01-16 DIAGNOSIS — N186 End stage renal disease: Secondary | ICD-10-CM | POA: Diagnosis not present

## 2020-01-16 DIAGNOSIS — I10 Essential (primary) hypertension: Secondary | ICD-10-CM | POA: Diagnosis not present

## 2020-01-17 DIAGNOSIS — I10 Essential (primary) hypertension: Secondary | ICD-10-CM | POA: Diagnosis not present

## 2020-01-17 DIAGNOSIS — M6281 Muscle weakness (generalized): Secondary | ICD-10-CM | POA: Diagnosis not present

## 2020-01-17 DIAGNOSIS — R2681 Unsteadiness on feet: Secondary | ICD-10-CM | POA: Diagnosis not present

## 2020-01-17 DIAGNOSIS — N186 End stage renal disease: Secondary | ICD-10-CM | POA: Diagnosis not present

## 2020-01-17 DIAGNOSIS — R2689 Other abnormalities of gait and mobility: Secondary | ICD-10-CM | POA: Diagnosis not present

## 2020-01-17 DIAGNOSIS — I69353 Hemiplegia and hemiparesis following cerebral infarction affecting right non-dominant side: Secondary | ICD-10-CM | POA: Diagnosis not present

## 2020-01-17 DIAGNOSIS — M6259 Muscle wasting and atrophy, not elsewhere classified, multiple sites: Secondary | ICD-10-CM | POA: Diagnosis not present

## 2020-01-17 DIAGNOSIS — E119 Type 2 diabetes mellitus without complications: Secondary | ICD-10-CM | POA: Diagnosis not present

## 2020-01-17 DIAGNOSIS — R262 Difficulty in walking, not elsewhere classified: Secondary | ICD-10-CM | POA: Diagnosis not present

## 2020-01-18 DIAGNOSIS — N186 End stage renal disease: Secondary | ICD-10-CM | POA: Diagnosis not present

## 2020-01-18 DIAGNOSIS — M6259 Muscle wasting and atrophy, not elsewhere classified, multiple sites: Secondary | ICD-10-CM | POA: Diagnosis not present

## 2020-01-18 DIAGNOSIS — I10 Essential (primary) hypertension: Secondary | ICD-10-CM | POA: Diagnosis not present

## 2020-01-18 DIAGNOSIS — R2689 Other abnormalities of gait and mobility: Secondary | ICD-10-CM | POA: Diagnosis not present

## 2020-01-18 DIAGNOSIS — I69353 Hemiplegia and hemiparesis following cerebral infarction affecting right non-dominant side: Secondary | ICD-10-CM | POA: Diagnosis not present

## 2020-01-18 DIAGNOSIS — M6281 Muscle weakness (generalized): Secondary | ICD-10-CM | POA: Diagnosis not present

## 2020-01-18 DIAGNOSIS — R2681 Unsteadiness on feet: Secondary | ICD-10-CM | POA: Diagnosis not present

## 2020-01-18 DIAGNOSIS — E119 Type 2 diabetes mellitus without complications: Secondary | ICD-10-CM | POA: Diagnosis not present

## 2020-01-18 DIAGNOSIS — R262 Difficulty in walking, not elsewhere classified: Secondary | ICD-10-CM | POA: Diagnosis not present

## 2020-01-20 DIAGNOSIS — M6281 Muscle weakness (generalized): Secondary | ICD-10-CM | POA: Diagnosis not present

## 2020-01-20 DIAGNOSIS — I10 Essential (primary) hypertension: Secondary | ICD-10-CM | POA: Diagnosis not present

## 2020-01-20 DIAGNOSIS — R2681 Unsteadiness on feet: Secondary | ICD-10-CM | POA: Diagnosis not present

## 2020-01-20 DIAGNOSIS — E119 Type 2 diabetes mellitus without complications: Secondary | ICD-10-CM | POA: Diagnosis not present

## 2020-01-20 DIAGNOSIS — R2689 Other abnormalities of gait and mobility: Secondary | ICD-10-CM | POA: Diagnosis not present

## 2020-01-20 DIAGNOSIS — N186 End stage renal disease: Secondary | ICD-10-CM | POA: Diagnosis not present

## 2020-01-20 DIAGNOSIS — R262 Difficulty in walking, not elsewhere classified: Secondary | ICD-10-CM | POA: Diagnosis not present

## 2020-01-20 DIAGNOSIS — I69353 Hemiplegia and hemiparesis following cerebral infarction affecting right non-dominant side: Secondary | ICD-10-CM | POA: Diagnosis not present

## 2020-01-20 DIAGNOSIS — M6259 Muscle wasting and atrophy, not elsewhere classified, multiple sites: Secondary | ICD-10-CM | POA: Diagnosis not present

## 2020-01-21 DIAGNOSIS — M6281 Muscle weakness (generalized): Secondary | ICD-10-CM | POA: Diagnosis not present

## 2020-01-21 DIAGNOSIS — N186 End stage renal disease: Secondary | ICD-10-CM | POA: Diagnosis not present

## 2020-01-21 DIAGNOSIS — E119 Type 2 diabetes mellitus without complications: Secondary | ICD-10-CM | POA: Diagnosis not present

## 2020-01-21 DIAGNOSIS — M6259 Muscle wasting and atrophy, not elsewhere classified, multiple sites: Secondary | ICD-10-CM | POA: Diagnosis not present

## 2020-01-21 DIAGNOSIS — R262 Difficulty in walking, not elsewhere classified: Secondary | ICD-10-CM | POA: Diagnosis not present

## 2020-01-21 DIAGNOSIS — R2681 Unsteadiness on feet: Secondary | ICD-10-CM | POA: Diagnosis not present

## 2020-01-21 DIAGNOSIS — I10 Essential (primary) hypertension: Secondary | ICD-10-CM | POA: Diagnosis not present

## 2020-01-21 DIAGNOSIS — I69353 Hemiplegia and hemiparesis following cerebral infarction affecting right non-dominant side: Secondary | ICD-10-CM | POA: Diagnosis not present

## 2020-01-21 DIAGNOSIS — R2689 Other abnormalities of gait and mobility: Secondary | ICD-10-CM | POA: Diagnosis not present

## 2020-01-22 DIAGNOSIS — N186 End stage renal disease: Secondary | ICD-10-CM | POA: Diagnosis not present

## 2020-01-22 DIAGNOSIS — R262 Difficulty in walking, not elsewhere classified: Secondary | ICD-10-CM | POA: Diagnosis not present

## 2020-01-22 DIAGNOSIS — R2689 Other abnormalities of gait and mobility: Secondary | ICD-10-CM | POA: Diagnosis not present

## 2020-01-22 DIAGNOSIS — I10 Essential (primary) hypertension: Secondary | ICD-10-CM | POA: Diagnosis not present

## 2020-01-22 DIAGNOSIS — M6281 Muscle weakness (generalized): Secondary | ICD-10-CM | POA: Diagnosis not present

## 2020-01-22 DIAGNOSIS — R2681 Unsteadiness on feet: Secondary | ICD-10-CM | POA: Diagnosis not present

## 2020-01-22 DIAGNOSIS — E119 Type 2 diabetes mellitus without complications: Secondary | ICD-10-CM | POA: Diagnosis not present

## 2020-01-22 DIAGNOSIS — I69353 Hemiplegia and hemiparesis following cerebral infarction affecting right non-dominant side: Secondary | ICD-10-CM | POA: Diagnosis not present

## 2020-01-22 DIAGNOSIS — M6259 Muscle wasting and atrophy, not elsewhere classified, multiple sites: Secondary | ICD-10-CM | POA: Diagnosis not present

## 2020-01-23 DIAGNOSIS — N186 End stage renal disease: Secondary | ICD-10-CM | POA: Diagnosis not present

## 2020-01-23 DIAGNOSIS — M6259 Muscle wasting and atrophy, not elsewhere classified, multiple sites: Secondary | ICD-10-CM | POA: Diagnosis not present

## 2020-01-23 DIAGNOSIS — M6281 Muscle weakness (generalized): Secondary | ICD-10-CM | POA: Diagnosis not present

## 2020-01-23 DIAGNOSIS — E119 Type 2 diabetes mellitus without complications: Secondary | ICD-10-CM | POA: Diagnosis not present

## 2020-01-23 DIAGNOSIS — I10 Essential (primary) hypertension: Secondary | ICD-10-CM | POA: Diagnosis not present

## 2020-01-23 DIAGNOSIS — R2681 Unsteadiness on feet: Secondary | ICD-10-CM | POA: Diagnosis not present

## 2020-01-23 DIAGNOSIS — I69353 Hemiplegia and hemiparesis following cerebral infarction affecting right non-dominant side: Secondary | ICD-10-CM | POA: Diagnosis not present

## 2020-01-23 DIAGNOSIS — R2689 Other abnormalities of gait and mobility: Secondary | ICD-10-CM | POA: Diagnosis not present

## 2020-01-23 DIAGNOSIS — R262 Difficulty in walking, not elsewhere classified: Secondary | ICD-10-CM | POA: Diagnosis not present

## 2020-01-24 DIAGNOSIS — M6281 Muscle weakness (generalized): Secondary | ICD-10-CM | POA: Diagnosis not present

## 2020-01-24 DIAGNOSIS — E119 Type 2 diabetes mellitus without complications: Secondary | ICD-10-CM | POA: Diagnosis not present

## 2020-01-24 DIAGNOSIS — M6259 Muscle wasting and atrophy, not elsewhere classified, multiple sites: Secondary | ICD-10-CM | POA: Diagnosis not present

## 2020-01-24 DIAGNOSIS — R2689 Other abnormalities of gait and mobility: Secondary | ICD-10-CM | POA: Diagnosis not present

## 2020-01-24 DIAGNOSIS — I10 Essential (primary) hypertension: Secondary | ICD-10-CM | POA: Diagnosis not present

## 2020-01-24 DIAGNOSIS — R2681 Unsteadiness on feet: Secondary | ICD-10-CM | POA: Diagnosis not present

## 2020-01-24 DIAGNOSIS — I69353 Hemiplegia and hemiparesis following cerebral infarction affecting right non-dominant side: Secondary | ICD-10-CM | POA: Diagnosis not present

## 2020-01-24 DIAGNOSIS — R262 Difficulty in walking, not elsewhere classified: Secondary | ICD-10-CM | POA: Diagnosis not present

## 2020-01-24 DIAGNOSIS — N186 End stage renal disease: Secondary | ICD-10-CM | POA: Diagnosis not present

## 2020-01-25 DIAGNOSIS — I69353 Hemiplegia and hemiparesis following cerebral infarction affecting right non-dominant side: Secondary | ICD-10-CM | POA: Diagnosis not present

## 2020-01-25 DIAGNOSIS — R2689 Other abnormalities of gait and mobility: Secondary | ICD-10-CM | POA: Diagnosis not present

## 2020-01-25 DIAGNOSIS — M6281 Muscle weakness (generalized): Secondary | ICD-10-CM | POA: Diagnosis not present

## 2020-01-25 DIAGNOSIS — R262 Difficulty in walking, not elsewhere classified: Secondary | ICD-10-CM | POA: Diagnosis not present

## 2020-01-25 DIAGNOSIS — N186 End stage renal disease: Secondary | ICD-10-CM | POA: Diagnosis not present

## 2020-01-25 DIAGNOSIS — Z03818 Encounter for observation for suspected exposure to other biological agents ruled out: Secondary | ICD-10-CM | POA: Diagnosis not present

## 2020-01-25 DIAGNOSIS — M6259 Muscle wasting and atrophy, not elsewhere classified, multiple sites: Secondary | ICD-10-CM | POA: Diagnosis not present

## 2020-01-25 DIAGNOSIS — I10 Essential (primary) hypertension: Secondary | ICD-10-CM | POA: Diagnosis not present

## 2020-01-25 DIAGNOSIS — E119 Type 2 diabetes mellitus without complications: Secondary | ICD-10-CM | POA: Diagnosis not present

## 2020-01-25 DIAGNOSIS — R2681 Unsteadiness on feet: Secondary | ICD-10-CM | POA: Diagnosis not present

## 2020-01-28 DIAGNOSIS — R2689 Other abnormalities of gait and mobility: Secondary | ICD-10-CM | POA: Diagnosis not present

## 2020-01-28 DIAGNOSIS — R262 Difficulty in walking, not elsewhere classified: Secondary | ICD-10-CM | POA: Diagnosis not present

## 2020-01-28 DIAGNOSIS — I69353 Hemiplegia and hemiparesis following cerebral infarction affecting right non-dominant side: Secondary | ICD-10-CM | POA: Diagnosis not present

## 2020-01-28 DIAGNOSIS — N186 End stage renal disease: Secondary | ICD-10-CM | POA: Diagnosis not present

## 2020-01-28 DIAGNOSIS — I10 Essential (primary) hypertension: Secondary | ICD-10-CM | POA: Diagnosis not present

## 2020-01-28 DIAGNOSIS — E119 Type 2 diabetes mellitus without complications: Secondary | ICD-10-CM | POA: Diagnosis not present

## 2020-01-28 DIAGNOSIS — R2681 Unsteadiness on feet: Secondary | ICD-10-CM | POA: Diagnosis not present

## 2020-01-28 DIAGNOSIS — M6281 Muscle weakness (generalized): Secondary | ICD-10-CM | POA: Diagnosis not present

## 2020-01-28 DIAGNOSIS — M6259 Muscle wasting and atrophy, not elsewhere classified, multiple sites: Secondary | ICD-10-CM | POA: Diagnosis not present

## 2020-01-29 DIAGNOSIS — M6259 Muscle wasting and atrophy, not elsewhere classified, multiple sites: Secondary | ICD-10-CM | POA: Diagnosis not present

## 2020-01-29 DIAGNOSIS — E119 Type 2 diabetes mellitus without complications: Secondary | ICD-10-CM | POA: Diagnosis not present

## 2020-01-29 DIAGNOSIS — N186 End stage renal disease: Secondary | ICD-10-CM | POA: Diagnosis not present

## 2020-01-29 DIAGNOSIS — R2681 Unsteadiness on feet: Secondary | ICD-10-CM | POA: Diagnosis not present

## 2020-01-29 DIAGNOSIS — R262 Difficulty in walking, not elsewhere classified: Secondary | ICD-10-CM | POA: Diagnosis not present

## 2020-01-29 DIAGNOSIS — I69353 Hemiplegia and hemiparesis following cerebral infarction affecting right non-dominant side: Secondary | ICD-10-CM | POA: Diagnosis not present

## 2020-01-29 DIAGNOSIS — R2689 Other abnormalities of gait and mobility: Secondary | ICD-10-CM | POA: Diagnosis not present

## 2020-01-29 DIAGNOSIS — M6281 Muscle weakness (generalized): Secondary | ICD-10-CM | POA: Diagnosis not present

## 2020-01-29 DIAGNOSIS — I10 Essential (primary) hypertension: Secondary | ICD-10-CM | POA: Diagnosis not present

## 2020-01-30 DIAGNOSIS — M6281 Muscle weakness (generalized): Secondary | ICD-10-CM | POA: Diagnosis not present

## 2020-01-30 DIAGNOSIS — I69353 Hemiplegia and hemiparesis following cerebral infarction affecting right non-dominant side: Secondary | ICD-10-CM | POA: Diagnosis not present

## 2020-01-30 DIAGNOSIS — R2681 Unsteadiness on feet: Secondary | ICD-10-CM | POA: Diagnosis not present

## 2020-01-30 DIAGNOSIS — R2689 Other abnormalities of gait and mobility: Secondary | ICD-10-CM | POA: Diagnosis not present

## 2020-01-30 DIAGNOSIS — M6259 Muscle wasting and atrophy, not elsewhere classified, multiple sites: Secondary | ICD-10-CM | POA: Diagnosis not present

## 2020-01-30 DIAGNOSIS — E119 Type 2 diabetes mellitus without complications: Secondary | ICD-10-CM | POA: Diagnosis not present

## 2020-01-30 DIAGNOSIS — N186 End stage renal disease: Secondary | ICD-10-CM | POA: Diagnosis not present

## 2020-01-30 DIAGNOSIS — Z7901 Long term (current) use of anticoagulants: Secondary | ICD-10-CM | POA: Diagnosis not present

## 2020-01-30 DIAGNOSIS — I10 Essential (primary) hypertension: Secondary | ICD-10-CM | POA: Diagnosis not present

## 2020-01-30 DIAGNOSIS — R262 Difficulty in walking, not elsewhere classified: Secondary | ICD-10-CM | POA: Diagnosis not present

## 2020-01-31 ENCOUNTER — Ambulatory Visit: Payer: Medicare Other | Admitting: Gastroenterology

## 2020-01-31 DIAGNOSIS — N186 End stage renal disease: Secondary | ICD-10-CM | POA: Diagnosis not present

## 2020-01-31 DIAGNOSIS — R262 Difficulty in walking, not elsewhere classified: Secondary | ICD-10-CM | POA: Diagnosis not present

## 2020-01-31 DIAGNOSIS — R2689 Other abnormalities of gait and mobility: Secondary | ICD-10-CM | POA: Diagnosis not present

## 2020-01-31 DIAGNOSIS — I10 Essential (primary) hypertension: Secondary | ICD-10-CM | POA: Diagnosis not present

## 2020-01-31 DIAGNOSIS — M6281 Muscle weakness (generalized): Secondary | ICD-10-CM | POA: Diagnosis not present

## 2020-01-31 DIAGNOSIS — I69353 Hemiplegia and hemiparesis following cerebral infarction affecting right non-dominant side: Secondary | ICD-10-CM | POA: Diagnosis not present

## 2020-01-31 DIAGNOSIS — E119 Type 2 diabetes mellitus without complications: Secondary | ICD-10-CM | POA: Diagnosis not present

## 2020-01-31 DIAGNOSIS — R2681 Unsteadiness on feet: Secondary | ICD-10-CM | POA: Diagnosis not present

## 2020-01-31 DIAGNOSIS — M6259 Muscle wasting and atrophy, not elsewhere classified, multiple sites: Secondary | ICD-10-CM | POA: Diagnosis not present

## 2020-02-01 DIAGNOSIS — R2689 Other abnormalities of gait and mobility: Secondary | ICD-10-CM | POA: Diagnosis not present

## 2020-02-01 DIAGNOSIS — I10 Essential (primary) hypertension: Secondary | ICD-10-CM | POA: Diagnosis not present

## 2020-02-01 DIAGNOSIS — E119 Type 2 diabetes mellitus without complications: Secondary | ICD-10-CM | POA: Diagnosis not present

## 2020-02-01 DIAGNOSIS — R262 Difficulty in walking, not elsewhere classified: Secondary | ICD-10-CM | POA: Diagnosis not present

## 2020-02-01 DIAGNOSIS — R2681 Unsteadiness on feet: Secondary | ICD-10-CM | POA: Diagnosis not present

## 2020-02-01 DIAGNOSIS — Z03818 Encounter for observation for suspected exposure to other biological agents ruled out: Secondary | ICD-10-CM | POA: Diagnosis not present

## 2020-02-01 DIAGNOSIS — N186 End stage renal disease: Secondary | ICD-10-CM | POA: Diagnosis not present

## 2020-02-01 DIAGNOSIS — M6281 Muscle weakness (generalized): Secondary | ICD-10-CM | POA: Diagnosis not present

## 2020-02-01 DIAGNOSIS — I69353 Hemiplegia and hemiparesis following cerebral infarction affecting right non-dominant side: Secondary | ICD-10-CM | POA: Diagnosis not present

## 2020-02-01 DIAGNOSIS — M6259 Muscle wasting and atrophy, not elsewhere classified, multiple sites: Secondary | ICD-10-CM | POA: Diagnosis not present

## 2020-02-03 DIAGNOSIS — R262 Difficulty in walking, not elsewhere classified: Secondary | ICD-10-CM | POA: Diagnosis not present

## 2020-02-03 DIAGNOSIS — M6281 Muscle weakness (generalized): Secondary | ICD-10-CM | POA: Diagnosis not present

## 2020-02-03 DIAGNOSIS — I69353 Hemiplegia and hemiparesis following cerebral infarction affecting right non-dominant side: Secondary | ICD-10-CM | POA: Diagnosis not present

## 2020-02-03 DIAGNOSIS — I10 Essential (primary) hypertension: Secondary | ICD-10-CM | POA: Diagnosis not present

## 2020-02-03 DIAGNOSIS — R2689 Other abnormalities of gait and mobility: Secondary | ICD-10-CM | POA: Diagnosis not present

## 2020-02-03 DIAGNOSIS — E119 Type 2 diabetes mellitus without complications: Secondary | ICD-10-CM | POA: Diagnosis not present

## 2020-02-03 DIAGNOSIS — N186 End stage renal disease: Secondary | ICD-10-CM | POA: Diagnosis not present

## 2020-02-03 DIAGNOSIS — M6259 Muscle wasting and atrophy, not elsewhere classified, multiple sites: Secondary | ICD-10-CM | POA: Diagnosis not present

## 2020-02-03 DIAGNOSIS — R2681 Unsteadiness on feet: Secondary | ICD-10-CM | POA: Diagnosis not present

## 2020-02-04 DIAGNOSIS — I10 Essential (primary) hypertension: Secondary | ICD-10-CM | POA: Diagnosis not present

## 2020-02-04 DIAGNOSIS — I69353 Hemiplegia and hemiparesis following cerebral infarction affecting right non-dominant side: Secondary | ICD-10-CM | POA: Diagnosis not present

## 2020-02-04 DIAGNOSIS — M6259 Muscle wasting and atrophy, not elsewhere classified, multiple sites: Secondary | ICD-10-CM | POA: Diagnosis not present

## 2020-02-04 DIAGNOSIS — R262 Difficulty in walking, not elsewhere classified: Secondary | ICD-10-CM | POA: Diagnosis not present

## 2020-02-04 DIAGNOSIS — E119 Type 2 diabetes mellitus without complications: Secondary | ICD-10-CM | POA: Diagnosis not present

## 2020-02-04 DIAGNOSIS — N186 End stage renal disease: Secondary | ICD-10-CM | POA: Diagnosis not present

## 2020-02-04 DIAGNOSIS — R2681 Unsteadiness on feet: Secondary | ICD-10-CM | POA: Diagnosis not present

## 2020-02-04 DIAGNOSIS — M6281 Muscle weakness (generalized): Secondary | ICD-10-CM | POA: Diagnosis not present

## 2020-02-04 DIAGNOSIS — R2689 Other abnormalities of gait and mobility: Secondary | ICD-10-CM | POA: Diagnosis not present

## 2020-02-05 DIAGNOSIS — R2689 Other abnormalities of gait and mobility: Secondary | ICD-10-CM | POA: Diagnosis not present

## 2020-02-05 DIAGNOSIS — M6281 Muscle weakness (generalized): Secondary | ICD-10-CM | POA: Diagnosis not present

## 2020-02-05 DIAGNOSIS — I69353 Hemiplegia and hemiparesis following cerebral infarction affecting right non-dominant side: Secondary | ICD-10-CM | POA: Diagnosis not present

## 2020-02-05 DIAGNOSIS — R262 Difficulty in walking, not elsewhere classified: Secondary | ICD-10-CM | POA: Diagnosis not present

## 2020-02-05 DIAGNOSIS — N186 End stage renal disease: Secondary | ICD-10-CM | POA: Diagnosis not present

## 2020-02-05 DIAGNOSIS — M6259 Muscle wasting and atrophy, not elsewhere classified, multiple sites: Secondary | ICD-10-CM | POA: Diagnosis not present

## 2020-02-05 DIAGNOSIS — R2681 Unsteadiness on feet: Secondary | ICD-10-CM | POA: Diagnosis not present

## 2020-02-05 DIAGNOSIS — I10 Essential (primary) hypertension: Secondary | ICD-10-CM | POA: Diagnosis not present

## 2020-02-05 DIAGNOSIS — E119 Type 2 diabetes mellitus without complications: Secondary | ICD-10-CM | POA: Diagnosis not present

## 2020-02-06 DIAGNOSIS — R2689 Other abnormalities of gait and mobility: Secondary | ICD-10-CM | POA: Diagnosis not present

## 2020-02-06 DIAGNOSIS — R278 Other lack of coordination: Secondary | ICD-10-CM | POA: Diagnosis not present

## 2020-02-06 DIAGNOSIS — R262 Difficulty in walking, not elsewhere classified: Secondary | ICD-10-CM | POA: Diagnosis not present

## 2020-02-06 DIAGNOSIS — E119 Type 2 diabetes mellitus without complications: Secondary | ICD-10-CM | POA: Diagnosis not present

## 2020-02-06 DIAGNOSIS — R2681 Unsteadiness on feet: Secondary | ICD-10-CM | POA: Diagnosis not present

## 2020-02-06 DIAGNOSIS — N186 End stage renal disease: Secondary | ICD-10-CM | POA: Diagnosis not present

## 2020-02-06 DIAGNOSIS — I69353 Hemiplegia and hemiparesis following cerebral infarction affecting right non-dominant side: Secondary | ICD-10-CM | POA: Diagnosis not present

## 2020-02-06 DIAGNOSIS — M6259 Muscle wasting and atrophy, not elsewhere classified, multiple sites: Secondary | ICD-10-CM | POA: Diagnosis not present

## 2020-02-06 DIAGNOSIS — I10 Essential (primary) hypertension: Secondary | ICD-10-CM | POA: Diagnosis not present

## 2020-02-06 DIAGNOSIS — M6281 Muscle weakness (generalized): Secondary | ICD-10-CM | POA: Diagnosis not present

## 2020-02-07 DIAGNOSIS — N186 End stage renal disease: Secondary | ICD-10-CM | POA: Diagnosis not present

## 2020-02-07 DIAGNOSIS — R2689 Other abnormalities of gait and mobility: Secondary | ICD-10-CM | POA: Diagnosis not present

## 2020-02-07 DIAGNOSIS — E119 Type 2 diabetes mellitus without complications: Secondary | ICD-10-CM | POA: Diagnosis not present

## 2020-02-07 DIAGNOSIS — R262 Difficulty in walking, not elsewhere classified: Secondary | ICD-10-CM | POA: Diagnosis not present

## 2020-02-07 DIAGNOSIS — I69353 Hemiplegia and hemiparesis following cerebral infarction affecting right non-dominant side: Secondary | ICD-10-CM | POA: Diagnosis not present

## 2020-02-07 DIAGNOSIS — R2681 Unsteadiness on feet: Secondary | ICD-10-CM | POA: Diagnosis not present

## 2020-02-07 DIAGNOSIS — R278 Other lack of coordination: Secondary | ICD-10-CM | POA: Diagnosis not present

## 2020-02-07 DIAGNOSIS — M6281 Muscle weakness (generalized): Secondary | ICD-10-CM | POA: Diagnosis not present

## 2020-02-07 DIAGNOSIS — M6259 Muscle wasting and atrophy, not elsewhere classified, multiple sites: Secondary | ICD-10-CM | POA: Diagnosis not present

## 2020-02-07 DIAGNOSIS — I10 Essential (primary) hypertension: Secondary | ICD-10-CM | POA: Diagnosis not present

## 2020-02-08 DIAGNOSIS — M6281 Muscle weakness (generalized): Secondary | ICD-10-CM | POA: Diagnosis not present

## 2020-02-08 DIAGNOSIS — R262 Difficulty in walking, not elsewhere classified: Secondary | ICD-10-CM | POA: Diagnosis not present

## 2020-02-08 DIAGNOSIS — M6259 Muscle wasting and atrophy, not elsewhere classified, multiple sites: Secondary | ICD-10-CM | POA: Diagnosis not present

## 2020-02-08 DIAGNOSIS — I10 Essential (primary) hypertension: Secondary | ICD-10-CM | POA: Diagnosis not present

## 2020-02-08 DIAGNOSIS — E119 Type 2 diabetes mellitus without complications: Secondary | ICD-10-CM | POA: Diagnosis not present

## 2020-02-08 DIAGNOSIS — N186 End stage renal disease: Secondary | ICD-10-CM | POA: Diagnosis not present

## 2020-02-08 DIAGNOSIS — R278 Other lack of coordination: Secondary | ICD-10-CM | POA: Diagnosis not present

## 2020-02-08 DIAGNOSIS — R2689 Other abnormalities of gait and mobility: Secondary | ICD-10-CM | POA: Diagnosis not present

## 2020-02-08 DIAGNOSIS — I69353 Hemiplegia and hemiparesis following cerebral infarction affecting right non-dominant side: Secondary | ICD-10-CM | POA: Diagnosis not present

## 2020-02-08 DIAGNOSIS — R2681 Unsteadiness on feet: Secondary | ICD-10-CM | POA: Diagnosis not present

## 2020-02-08 DIAGNOSIS — Z03818 Encounter for observation for suspected exposure to other biological agents ruled out: Secondary | ICD-10-CM | POA: Diagnosis not present

## 2020-02-11 DIAGNOSIS — R262 Difficulty in walking, not elsewhere classified: Secondary | ICD-10-CM | POA: Diagnosis not present

## 2020-02-11 DIAGNOSIS — R2689 Other abnormalities of gait and mobility: Secondary | ICD-10-CM | POA: Diagnosis not present

## 2020-02-11 DIAGNOSIS — I69353 Hemiplegia and hemiparesis following cerebral infarction affecting right non-dominant side: Secondary | ICD-10-CM | POA: Diagnosis not present

## 2020-02-11 DIAGNOSIS — N186 End stage renal disease: Secondary | ICD-10-CM | POA: Diagnosis not present

## 2020-02-11 DIAGNOSIS — M6281 Muscle weakness (generalized): Secondary | ICD-10-CM | POA: Diagnosis not present

## 2020-02-11 DIAGNOSIS — R2681 Unsteadiness on feet: Secondary | ICD-10-CM | POA: Diagnosis not present

## 2020-02-11 DIAGNOSIS — M6259 Muscle wasting and atrophy, not elsewhere classified, multiple sites: Secondary | ICD-10-CM | POA: Diagnosis not present

## 2020-02-11 DIAGNOSIS — I10 Essential (primary) hypertension: Secondary | ICD-10-CM | POA: Diagnosis not present

## 2020-02-11 DIAGNOSIS — R278 Other lack of coordination: Secondary | ICD-10-CM | POA: Diagnosis not present

## 2020-02-11 DIAGNOSIS — E119 Type 2 diabetes mellitus without complications: Secondary | ICD-10-CM | POA: Diagnosis not present

## 2020-02-12 DIAGNOSIS — I10 Essential (primary) hypertension: Secondary | ICD-10-CM | POA: Diagnosis not present

## 2020-02-12 DIAGNOSIS — M6281 Muscle weakness (generalized): Secondary | ICD-10-CM | POA: Diagnosis not present

## 2020-02-12 DIAGNOSIS — R278 Other lack of coordination: Secondary | ICD-10-CM | POA: Diagnosis not present

## 2020-02-12 DIAGNOSIS — R262 Difficulty in walking, not elsewhere classified: Secondary | ICD-10-CM | POA: Diagnosis not present

## 2020-02-12 DIAGNOSIS — M6259 Muscle wasting and atrophy, not elsewhere classified, multiple sites: Secondary | ICD-10-CM | POA: Diagnosis not present

## 2020-02-12 DIAGNOSIS — R2689 Other abnormalities of gait and mobility: Secondary | ICD-10-CM | POA: Diagnosis not present

## 2020-02-12 DIAGNOSIS — R2681 Unsteadiness on feet: Secondary | ICD-10-CM | POA: Diagnosis not present

## 2020-02-12 DIAGNOSIS — E119 Type 2 diabetes mellitus without complications: Secondary | ICD-10-CM | POA: Diagnosis not present

## 2020-02-12 DIAGNOSIS — N186 End stage renal disease: Secondary | ICD-10-CM | POA: Diagnosis not present

## 2020-02-12 DIAGNOSIS — I69353 Hemiplegia and hemiparesis following cerebral infarction affecting right non-dominant side: Secondary | ICD-10-CM | POA: Diagnosis not present

## 2020-02-13 DIAGNOSIS — E119 Type 2 diabetes mellitus without complications: Secondary | ICD-10-CM | POA: Diagnosis not present

## 2020-02-13 DIAGNOSIS — I10 Essential (primary) hypertension: Secondary | ICD-10-CM | POA: Diagnosis not present

## 2020-02-13 DIAGNOSIS — R278 Other lack of coordination: Secondary | ICD-10-CM | POA: Diagnosis not present

## 2020-02-13 DIAGNOSIS — I69353 Hemiplegia and hemiparesis following cerebral infarction affecting right non-dominant side: Secondary | ICD-10-CM | POA: Diagnosis not present

## 2020-02-13 DIAGNOSIS — M6259 Muscle wasting and atrophy, not elsewhere classified, multiple sites: Secondary | ICD-10-CM | POA: Diagnosis not present

## 2020-02-13 DIAGNOSIS — R2681 Unsteadiness on feet: Secondary | ICD-10-CM | POA: Diagnosis not present

## 2020-02-13 DIAGNOSIS — N186 End stage renal disease: Secondary | ICD-10-CM | POA: Diagnosis not present

## 2020-02-13 DIAGNOSIS — R262 Difficulty in walking, not elsewhere classified: Secondary | ICD-10-CM | POA: Diagnosis not present

## 2020-02-13 DIAGNOSIS — R2689 Other abnormalities of gait and mobility: Secondary | ICD-10-CM | POA: Diagnosis not present

## 2020-02-13 DIAGNOSIS — M6281 Muscle weakness (generalized): Secondary | ICD-10-CM | POA: Diagnosis not present

## 2020-02-14 DIAGNOSIS — I10 Essential (primary) hypertension: Secondary | ICD-10-CM | POA: Diagnosis not present

## 2020-02-14 DIAGNOSIS — E119 Type 2 diabetes mellitus without complications: Secondary | ICD-10-CM | POA: Diagnosis not present

## 2020-02-14 DIAGNOSIS — I69353 Hemiplegia and hemiparesis following cerebral infarction affecting right non-dominant side: Secondary | ICD-10-CM | POA: Diagnosis not present

## 2020-02-14 DIAGNOSIS — R2689 Other abnormalities of gait and mobility: Secondary | ICD-10-CM | POA: Diagnosis not present

## 2020-02-14 DIAGNOSIS — M6259 Muscle wasting and atrophy, not elsewhere classified, multiple sites: Secondary | ICD-10-CM | POA: Diagnosis not present

## 2020-02-14 DIAGNOSIS — N186 End stage renal disease: Secondary | ICD-10-CM | POA: Diagnosis not present

## 2020-02-14 DIAGNOSIS — M6281 Muscle weakness (generalized): Secondary | ICD-10-CM | POA: Diagnosis not present

## 2020-02-14 DIAGNOSIS — R2681 Unsteadiness on feet: Secondary | ICD-10-CM | POA: Diagnosis not present

## 2020-02-14 DIAGNOSIS — R262 Difficulty in walking, not elsewhere classified: Secondary | ICD-10-CM | POA: Diagnosis not present

## 2020-02-14 DIAGNOSIS — I639 Cerebral infarction, unspecified: Secondary | ICD-10-CM | POA: Diagnosis not present

## 2020-02-14 DIAGNOSIS — R278 Other lack of coordination: Secondary | ICD-10-CM | POA: Diagnosis not present

## 2020-02-15 DIAGNOSIS — E119 Type 2 diabetes mellitus without complications: Secondary | ICD-10-CM | POA: Diagnosis not present

## 2020-02-15 DIAGNOSIS — M6281 Muscle weakness (generalized): Secondary | ICD-10-CM | POA: Diagnosis not present

## 2020-02-15 DIAGNOSIS — R262 Difficulty in walking, not elsewhere classified: Secondary | ICD-10-CM | POA: Diagnosis not present

## 2020-02-15 DIAGNOSIS — R2689 Other abnormalities of gait and mobility: Secondary | ICD-10-CM | POA: Diagnosis not present

## 2020-02-15 DIAGNOSIS — R278 Other lack of coordination: Secondary | ICD-10-CM | POA: Diagnosis not present

## 2020-02-15 DIAGNOSIS — N186 End stage renal disease: Secondary | ICD-10-CM | POA: Diagnosis not present

## 2020-02-15 DIAGNOSIS — R2681 Unsteadiness on feet: Secondary | ICD-10-CM | POA: Diagnosis not present

## 2020-02-15 DIAGNOSIS — M6259 Muscle wasting and atrophy, not elsewhere classified, multiple sites: Secondary | ICD-10-CM | POA: Diagnosis not present

## 2020-02-15 DIAGNOSIS — I10 Essential (primary) hypertension: Secondary | ICD-10-CM | POA: Diagnosis not present

## 2020-02-15 DIAGNOSIS — I69353 Hemiplegia and hemiparesis following cerebral infarction affecting right non-dominant side: Secondary | ICD-10-CM | POA: Diagnosis not present

## 2020-02-15 DIAGNOSIS — Z20822 Contact with and (suspected) exposure to covid-19: Secondary | ICD-10-CM | POA: Diagnosis not present

## 2020-02-18 DIAGNOSIS — R2689 Other abnormalities of gait and mobility: Secondary | ICD-10-CM | POA: Diagnosis not present

## 2020-02-18 DIAGNOSIS — I10 Essential (primary) hypertension: Secondary | ICD-10-CM | POA: Diagnosis not present

## 2020-02-18 DIAGNOSIS — R262 Difficulty in walking, not elsewhere classified: Secondary | ICD-10-CM | POA: Diagnosis not present

## 2020-02-18 DIAGNOSIS — N186 End stage renal disease: Secondary | ICD-10-CM | POA: Diagnosis not present

## 2020-02-18 DIAGNOSIS — M6281 Muscle weakness (generalized): Secondary | ICD-10-CM | POA: Diagnosis not present

## 2020-02-18 DIAGNOSIS — R278 Other lack of coordination: Secondary | ICD-10-CM | POA: Diagnosis not present

## 2020-02-18 DIAGNOSIS — E119 Type 2 diabetes mellitus without complications: Secondary | ICD-10-CM | POA: Diagnosis not present

## 2020-02-18 DIAGNOSIS — I69353 Hemiplegia and hemiparesis following cerebral infarction affecting right non-dominant side: Secondary | ICD-10-CM | POA: Diagnosis not present

## 2020-02-18 DIAGNOSIS — R2681 Unsteadiness on feet: Secondary | ICD-10-CM | POA: Diagnosis not present

## 2020-02-18 DIAGNOSIS — M6259 Muscle wasting and atrophy, not elsewhere classified, multiple sites: Secondary | ICD-10-CM | POA: Diagnosis not present

## 2020-02-20 DIAGNOSIS — R2681 Unsteadiness on feet: Secondary | ICD-10-CM | POA: Diagnosis not present

## 2020-02-20 DIAGNOSIS — M6281 Muscle weakness (generalized): Secondary | ICD-10-CM | POA: Diagnosis not present

## 2020-02-20 DIAGNOSIS — I10 Essential (primary) hypertension: Secondary | ICD-10-CM | POA: Diagnosis not present

## 2020-02-20 DIAGNOSIS — M6259 Muscle wasting and atrophy, not elsewhere classified, multiple sites: Secondary | ICD-10-CM | POA: Diagnosis not present

## 2020-02-20 DIAGNOSIS — I69353 Hemiplegia and hemiparesis following cerebral infarction affecting right non-dominant side: Secondary | ICD-10-CM | POA: Diagnosis not present

## 2020-02-20 DIAGNOSIS — E119 Type 2 diabetes mellitus without complications: Secondary | ICD-10-CM | POA: Diagnosis not present

## 2020-02-20 DIAGNOSIS — R262 Difficulty in walking, not elsewhere classified: Secondary | ICD-10-CM | POA: Diagnosis not present

## 2020-02-20 DIAGNOSIS — R2689 Other abnormalities of gait and mobility: Secondary | ICD-10-CM | POA: Diagnosis not present

## 2020-02-20 DIAGNOSIS — N186 End stage renal disease: Secondary | ICD-10-CM | POA: Diagnosis not present

## 2020-02-20 DIAGNOSIS — R278 Other lack of coordination: Secondary | ICD-10-CM | POA: Diagnosis not present

## 2020-02-21 DIAGNOSIS — R278 Other lack of coordination: Secondary | ICD-10-CM | POA: Diagnosis not present

## 2020-02-21 DIAGNOSIS — R2681 Unsteadiness on feet: Secondary | ICD-10-CM | POA: Diagnosis not present

## 2020-02-21 DIAGNOSIS — I69353 Hemiplegia and hemiparesis following cerebral infarction affecting right non-dominant side: Secondary | ICD-10-CM | POA: Diagnosis not present

## 2020-02-21 DIAGNOSIS — M6281 Muscle weakness (generalized): Secondary | ICD-10-CM | POA: Diagnosis not present

## 2020-02-21 DIAGNOSIS — E119 Type 2 diabetes mellitus without complications: Secondary | ICD-10-CM | POA: Diagnosis not present

## 2020-02-21 DIAGNOSIS — R262 Difficulty in walking, not elsewhere classified: Secondary | ICD-10-CM | POA: Diagnosis not present

## 2020-02-21 DIAGNOSIS — I10 Essential (primary) hypertension: Secondary | ICD-10-CM | POA: Diagnosis not present

## 2020-02-21 DIAGNOSIS — M6259 Muscle wasting and atrophy, not elsewhere classified, multiple sites: Secondary | ICD-10-CM | POA: Diagnosis not present

## 2020-02-21 DIAGNOSIS — R2689 Other abnormalities of gait and mobility: Secondary | ICD-10-CM | POA: Diagnosis not present

## 2020-02-21 DIAGNOSIS — N186 End stage renal disease: Secondary | ICD-10-CM | POA: Diagnosis not present

## 2020-02-22 DIAGNOSIS — R278 Other lack of coordination: Secondary | ICD-10-CM | POA: Diagnosis not present

## 2020-02-22 DIAGNOSIS — N186 End stage renal disease: Secondary | ICD-10-CM | POA: Diagnosis not present

## 2020-02-22 DIAGNOSIS — M6281 Muscle weakness (generalized): Secondary | ICD-10-CM | POA: Diagnosis not present

## 2020-02-22 DIAGNOSIS — M6259 Muscle wasting and atrophy, not elsewhere classified, multiple sites: Secondary | ICD-10-CM | POA: Diagnosis not present

## 2020-02-22 DIAGNOSIS — R2681 Unsteadiness on feet: Secondary | ICD-10-CM | POA: Diagnosis not present

## 2020-02-22 DIAGNOSIS — E119 Type 2 diabetes mellitus without complications: Secondary | ICD-10-CM | POA: Diagnosis not present

## 2020-02-22 DIAGNOSIS — I10 Essential (primary) hypertension: Secondary | ICD-10-CM | POA: Diagnosis not present

## 2020-02-22 DIAGNOSIS — I69353 Hemiplegia and hemiparesis following cerebral infarction affecting right non-dominant side: Secondary | ICD-10-CM | POA: Diagnosis not present

## 2020-02-22 DIAGNOSIS — Z03818 Encounter for observation for suspected exposure to other biological agents ruled out: Secondary | ICD-10-CM | POA: Diagnosis not present

## 2020-02-22 DIAGNOSIS — R262 Difficulty in walking, not elsewhere classified: Secondary | ICD-10-CM | POA: Diagnosis not present

## 2020-02-22 DIAGNOSIS — R2689 Other abnormalities of gait and mobility: Secondary | ICD-10-CM | POA: Diagnosis not present

## 2020-02-23 DIAGNOSIS — M6281 Muscle weakness (generalized): Secondary | ICD-10-CM | POA: Diagnosis not present

## 2020-02-23 DIAGNOSIS — E119 Type 2 diabetes mellitus without complications: Secondary | ICD-10-CM | POA: Diagnosis not present

## 2020-02-23 DIAGNOSIS — R2681 Unsteadiness on feet: Secondary | ICD-10-CM | POA: Diagnosis not present

## 2020-02-23 DIAGNOSIS — I10 Essential (primary) hypertension: Secondary | ICD-10-CM | POA: Diagnosis not present

## 2020-02-23 DIAGNOSIS — I69353 Hemiplegia and hemiparesis following cerebral infarction affecting right non-dominant side: Secondary | ICD-10-CM | POA: Diagnosis not present

## 2020-02-23 DIAGNOSIS — R278 Other lack of coordination: Secondary | ICD-10-CM | POA: Diagnosis not present

## 2020-02-23 DIAGNOSIS — N186 End stage renal disease: Secondary | ICD-10-CM | POA: Diagnosis not present

## 2020-02-23 DIAGNOSIS — R2689 Other abnormalities of gait and mobility: Secondary | ICD-10-CM | POA: Diagnosis not present

## 2020-02-23 DIAGNOSIS — R262 Difficulty in walking, not elsewhere classified: Secondary | ICD-10-CM | POA: Diagnosis not present

## 2020-02-23 DIAGNOSIS — M6259 Muscle wasting and atrophy, not elsewhere classified, multiple sites: Secondary | ICD-10-CM | POA: Diagnosis not present

## 2020-02-25 DIAGNOSIS — I10 Essential (primary) hypertension: Secondary | ICD-10-CM | POA: Diagnosis not present

## 2020-02-25 DIAGNOSIS — M6281 Muscle weakness (generalized): Secondary | ICD-10-CM | POA: Diagnosis not present

## 2020-02-25 DIAGNOSIS — E119 Type 2 diabetes mellitus without complications: Secondary | ICD-10-CM | POA: Diagnosis not present

## 2020-02-25 DIAGNOSIS — N186 End stage renal disease: Secondary | ICD-10-CM | POA: Diagnosis not present

## 2020-02-25 DIAGNOSIS — R262 Difficulty in walking, not elsewhere classified: Secondary | ICD-10-CM | POA: Diagnosis not present

## 2020-02-25 DIAGNOSIS — I69353 Hemiplegia and hemiparesis following cerebral infarction affecting right non-dominant side: Secondary | ICD-10-CM | POA: Diagnosis not present

## 2020-02-25 DIAGNOSIS — M6259 Muscle wasting and atrophy, not elsewhere classified, multiple sites: Secondary | ICD-10-CM | POA: Diagnosis not present

## 2020-02-25 DIAGNOSIS — R2689 Other abnormalities of gait and mobility: Secondary | ICD-10-CM | POA: Diagnosis not present

## 2020-02-25 DIAGNOSIS — R2681 Unsteadiness on feet: Secondary | ICD-10-CM | POA: Diagnosis not present

## 2020-02-25 DIAGNOSIS — R278 Other lack of coordination: Secondary | ICD-10-CM | POA: Diagnosis not present

## 2020-02-26 DIAGNOSIS — R278 Other lack of coordination: Secondary | ICD-10-CM | POA: Diagnosis not present

## 2020-02-26 DIAGNOSIS — M6259 Muscle wasting and atrophy, not elsewhere classified, multiple sites: Secondary | ICD-10-CM | POA: Diagnosis not present

## 2020-02-26 DIAGNOSIS — R262 Difficulty in walking, not elsewhere classified: Secondary | ICD-10-CM | POA: Diagnosis not present

## 2020-02-26 DIAGNOSIS — E119 Type 2 diabetes mellitus without complications: Secondary | ICD-10-CM | POA: Diagnosis not present

## 2020-02-26 DIAGNOSIS — I69353 Hemiplegia and hemiparesis following cerebral infarction affecting right non-dominant side: Secondary | ICD-10-CM | POA: Diagnosis not present

## 2020-02-26 DIAGNOSIS — R2681 Unsteadiness on feet: Secondary | ICD-10-CM | POA: Diagnosis not present

## 2020-02-26 DIAGNOSIS — M6281 Muscle weakness (generalized): Secondary | ICD-10-CM | POA: Diagnosis not present

## 2020-02-26 DIAGNOSIS — N186 End stage renal disease: Secondary | ICD-10-CM | POA: Diagnosis not present

## 2020-02-26 DIAGNOSIS — I10 Essential (primary) hypertension: Secondary | ICD-10-CM | POA: Diagnosis not present

## 2020-02-26 DIAGNOSIS — R2689 Other abnormalities of gait and mobility: Secondary | ICD-10-CM | POA: Diagnosis not present

## 2020-02-27 DIAGNOSIS — M6281 Muscle weakness (generalized): Secondary | ICD-10-CM | POA: Diagnosis not present

## 2020-02-27 DIAGNOSIS — M6259 Muscle wasting and atrophy, not elsewhere classified, multiple sites: Secondary | ICD-10-CM | POA: Diagnosis not present

## 2020-02-27 DIAGNOSIS — R2681 Unsteadiness on feet: Secondary | ICD-10-CM | POA: Diagnosis not present

## 2020-02-27 DIAGNOSIS — I10 Essential (primary) hypertension: Secondary | ICD-10-CM | POA: Diagnosis not present

## 2020-02-27 DIAGNOSIS — R2689 Other abnormalities of gait and mobility: Secondary | ICD-10-CM | POA: Diagnosis not present

## 2020-02-27 DIAGNOSIS — R262 Difficulty in walking, not elsewhere classified: Secondary | ICD-10-CM | POA: Diagnosis not present

## 2020-02-27 DIAGNOSIS — I69353 Hemiplegia and hemiparesis following cerebral infarction affecting right non-dominant side: Secondary | ICD-10-CM | POA: Diagnosis not present

## 2020-02-27 DIAGNOSIS — R278 Other lack of coordination: Secondary | ICD-10-CM | POA: Diagnosis not present

## 2020-02-27 DIAGNOSIS — E119 Type 2 diabetes mellitus without complications: Secondary | ICD-10-CM | POA: Diagnosis not present

## 2020-02-27 DIAGNOSIS — N186 End stage renal disease: Secondary | ICD-10-CM | POA: Diagnosis not present

## 2020-02-28 DIAGNOSIS — R278 Other lack of coordination: Secondary | ICD-10-CM | POA: Diagnosis not present

## 2020-02-28 DIAGNOSIS — I10 Essential (primary) hypertension: Secondary | ICD-10-CM | POA: Diagnosis not present

## 2020-02-28 DIAGNOSIS — E119 Type 2 diabetes mellitus without complications: Secondary | ICD-10-CM | POA: Diagnosis not present

## 2020-02-28 DIAGNOSIS — N186 End stage renal disease: Secondary | ICD-10-CM | POA: Diagnosis not present

## 2020-02-28 DIAGNOSIS — I69353 Hemiplegia and hemiparesis following cerebral infarction affecting right non-dominant side: Secondary | ICD-10-CM | POA: Diagnosis not present

## 2020-02-28 DIAGNOSIS — R262 Difficulty in walking, not elsewhere classified: Secondary | ICD-10-CM | POA: Diagnosis not present

## 2020-02-28 DIAGNOSIS — M6281 Muscle weakness (generalized): Secondary | ICD-10-CM | POA: Diagnosis not present

## 2020-02-28 DIAGNOSIS — R2681 Unsteadiness on feet: Secondary | ICD-10-CM | POA: Diagnosis not present

## 2020-02-28 DIAGNOSIS — M6259 Muscle wasting and atrophy, not elsewhere classified, multiple sites: Secondary | ICD-10-CM | POA: Diagnosis not present

## 2020-02-28 DIAGNOSIS — R2689 Other abnormalities of gait and mobility: Secondary | ICD-10-CM | POA: Diagnosis not present

## 2020-02-29 DIAGNOSIS — R278 Other lack of coordination: Secondary | ICD-10-CM | POA: Diagnosis not present

## 2020-02-29 DIAGNOSIS — R2689 Other abnormalities of gait and mobility: Secondary | ICD-10-CM | POA: Diagnosis not present

## 2020-02-29 DIAGNOSIS — M6259 Muscle wasting and atrophy, not elsewhere classified, multiple sites: Secondary | ICD-10-CM | POA: Diagnosis not present

## 2020-02-29 DIAGNOSIS — N186 End stage renal disease: Secondary | ICD-10-CM | POA: Diagnosis not present

## 2020-02-29 DIAGNOSIS — R2681 Unsteadiness on feet: Secondary | ICD-10-CM | POA: Diagnosis not present

## 2020-02-29 DIAGNOSIS — E119 Type 2 diabetes mellitus without complications: Secondary | ICD-10-CM | POA: Diagnosis not present

## 2020-02-29 DIAGNOSIS — R262 Difficulty in walking, not elsewhere classified: Secondary | ICD-10-CM | POA: Diagnosis not present

## 2020-02-29 DIAGNOSIS — M6281 Muscle weakness (generalized): Secondary | ICD-10-CM | POA: Diagnosis not present

## 2020-02-29 DIAGNOSIS — I10 Essential (primary) hypertension: Secondary | ICD-10-CM | POA: Diagnosis not present

## 2020-02-29 DIAGNOSIS — I69353 Hemiplegia and hemiparesis following cerebral infarction affecting right non-dominant side: Secondary | ICD-10-CM | POA: Diagnosis not present

## 2020-03-04 DIAGNOSIS — I10 Essential (primary) hypertension: Secondary | ICD-10-CM | POA: Diagnosis not present

## 2020-03-04 DIAGNOSIS — I69353 Hemiplegia and hemiparesis following cerebral infarction affecting right non-dominant side: Secondary | ICD-10-CM | POA: Diagnosis not present

## 2020-03-04 DIAGNOSIS — E119 Type 2 diabetes mellitus without complications: Secondary | ICD-10-CM | POA: Diagnosis not present

## 2020-03-04 DIAGNOSIS — R2681 Unsteadiness on feet: Secondary | ICD-10-CM | POA: Diagnosis not present

## 2020-03-04 DIAGNOSIS — N186 End stage renal disease: Secondary | ICD-10-CM | POA: Diagnosis not present

## 2020-03-04 DIAGNOSIS — R278 Other lack of coordination: Secondary | ICD-10-CM | POA: Diagnosis not present

## 2020-03-04 DIAGNOSIS — R262 Difficulty in walking, not elsewhere classified: Secondary | ICD-10-CM | POA: Diagnosis not present

## 2020-03-04 DIAGNOSIS — R2689 Other abnormalities of gait and mobility: Secondary | ICD-10-CM | POA: Diagnosis not present

## 2020-03-04 DIAGNOSIS — M6259 Muscle wasting and atrophy, not elsewhere classified, multiple sites: Secondary | ICD-10-CM | POA: Diagnosis not present

## 2020-03-04 DIAGNOSIS — M6281 Muscle weakness (generalized): Secondary | ICD-10-CM | POA: Diagnosis not present

## 2020-03-06 DIAGNOSIS — N186 End stage renal disease: Secondary | ICD-10-CM | POA: Diagnosis not present

## 2020-03-06 DIAGNOSIS — Z7901 Long term (current) use of anticoagulants: Secondary | ICD-10-CM | POA: Diagnosis not present

## 2020-03-06 DIAGNOSIS — I69353 Hemiplegia and hemiparesis following cerebral infarction affecting right non-dominant side: Secondary | ICD-10-CM | POA: Diagnosis not present

## 2020-03-06 DIAGNOSIS — E119 Type 2 diabetes mellitus without complications: Secondary | ICD-10-CM | POA: Diagnosis not present

## 2020-03-06 DIAGNOSIS — R2681 Unsteadiness on feet: Secondary | ICD-10-CM | POA: Diagnosis not present

## 2020-03-06 DIAGNOSIS — R262 Difficulty in walking, not elsewhere classified: Secondary | ICD-10-CM | POA: Diagnosis not present

## 2020-03-06 DIAGNOSIS — M6281 Muscle weakness (generalized): Secondary | ICD-10-CM | POA: Diagnosis not present

## 2020-03-06 DIAGNOSIS — R278 Other lack of coordination: Secondary | ICD-10-CM | POA: Diagnosis not present

## 2020-03-06 DIAGNOSIS — I10 Essential (primary) hypertension: Secondary | ICD-10-CM | POA: Diagnosis not present

## 2020-03-06 DIAGNOSIS — R2689 Other abnormalities of gait and mobility: Secondary | ICD-10-CM | POA: Diagnosis not present

## 2020-03-06 DIAGNOSIS — M6259 Muscle wasting and atrophy, not elsewhere classified, multiple sites: Secondary | ICD-10-CM | POA: Diagnosis not present

## 2020-03-07 DIAGNOSIS — M6281 Muscle weakness (generalized): Secondary | ICD-10-CM | POA: Diagnosis not present

## 2020-03-07 DIAGNOSIS — I69353 Hemiplegia and hemiparesis following cerebral infarction affecting right non-dominant side: Secondary | ICD-10-CM | POA: Diagnosis not present

## 2020-03-07 DIAGNOSIS — E119 Type 2 diabetes mellitus without complications: Secondary | ICD-10-CM | POA: Diagnosis not present

## 2020-03-07 DIAGNOSIS — R278 Other lack of coordination: Secondary | ICD-10-CM | POA: Diagnosis not present

## 2020-03-07 DIAGNOSIS — R262 Difficulty in walking, not elsewhere classified: Secondary | ICD-10-CM | POA: Diagnosis not present

## 2020-03-07 DIAGNOSIS — M6259 Muscle wasting and atrophy, not elsewhere classified, multiple sites: Secondary | ICD-10-CM | POA: Diagnosis not present

## 2020-03-07 DIAGNOSIS — N186 End stage renal disease: Secondary | ICD-10-CM | POA: Diagnosis not present

## 2020-03-07 DIAGNOSIS — R2681 Unsteadiness on feet: Secondary | ICD-10-CM | POA: Diagnosis not present

## 2020-03-07 DIAGNOSIS — Z03818 Encounter for observation for suspected exposure to other biological agents ruled out: Secondary | ICD-10-CM | POA: Diagnosis not present

## 2020-03-07 DIAGNOSIS — I10 Essential (primary) hypertension: Secondary | ICD-10-CM | POA: Diagnosis not present

## 2020-03-07 DIAGNOSIS — R2689 Other abnormalities of gait and mobility: Secondary | ICD-10-CM | POA: Diagnosis not present

## 2020-03-10 DIAGNOSIS — N186 End stage renal disease: Secondary | ICD-10-CM | POA: Diagnosis not present

## 2020-03-10 DIAGNOSIS — I69353 Hemiplegia and hemiparesis following cerebral infarction affecting right non-dominant side: Secondary | ICD-10-CM | POA: Diagnosis not present

## 2020-03-10 DIAGNOSIS — R2689 Other abnormalities of gait and mobility: Secondary | ICD-10-CM | POA: Diagnosis not present

## 2020-03-10 DIAGNOSIS — M6281 Muscle weakness (generalized): Secondary | ICD-10-CM | POA: Diagnosis not present

## 2020-03-10 DIAGNOSIS — R278 Other lack of coordination: Secondary | ICD-10-CM | POA: Diagnosis not present

## 2020-03-10 DIAGNOSIS — M6259 Muscle wasting and atrophy, not elsewhere classified, multiple sites: Secondary | ICD-10-CM | POA: Diagnosis not present

## 2020-03-10 DIAGNOSIS — R2681 Unsteadiness on feet: Secondary | ICD-10-CM | POA: Diagnosis not present

## 2020-03-10 DIAGNOSIS — R262 Difficulty in walking, not elsewhere classified: Secondary | ICD-10-CM | POA: Diagnosis not present

## 2020-03-10 DIAGNOSIS — I10 Essential (primary) hypertension: Secondary | ICD-10-CM | POA: Diagnosis not present

## 2020-03-10 DIAGNOSIS — E119 Type 2 diabetes mellitus without complications: Secondary | ICD-10-CM | POA: Diagnosis not present

## 2020-03-11 DIAGNOSIS — E119 Type 2 diabetes mellitus without complications: Secondary | ICD-10-CM | POA: Diagnosis not present

## 2020-03-11 DIAGNOSIS — N186 End stage renal disease: Secondary | ICD-10-CM | POA: Diagnosis not present

## 2020-03-11 DIAGNOSIS — M6281 Muscle weakness (generalized): Secondary | ICD-10-CM | POA: Diagnosis not present

## 2020-03-11 DIAGNOSIS — R278 Other lack of coordination: Secondary | ICD-10-CM | POA: Diagnosis not present

## 2020-03-11 DIAGNOSIS — I69353 Hemiplegia and hemiparesis following cerebral infarction affecting right non-dominant side: Secondary | ICD-10-CM | POA: Diagnosis not present

## 2020-03-11 DIAGNOSIS — M6259 Muscle wasting and atrophy, not elsewhere classified, multiple sites: Secondary | ICD-10-CM | POA: Diagnosis not present

## 2020-03-11 DIAGNOSIS — I10 Essential (primary) hypertension: Secondary | ICD-10-CM | POA: Diagnosis not present

## 2020-03-11 DIAGNOSIS — R262 Difficulty in walking, not elsewhere classified: Secondary | ICD-10-CM | POA: Diagnosis not present

## 2020-03-11 DIAGNOSIS — R2689 Other abnormalities of gait and mobility: Secondary | ICD-10-CM | POA: Diagnosis not present

## 2020-03-11 DIAGNOSIS — R2681 Unsteadiness on feet: Secondary | ICD-10-CM | POA: Diagnosis not present

## 2020-03-12 DIAGNOSIS — M6281 Muscle weakness (generalized): Secondary | ICD-10-CM | POA: Diagnosis not present

## 2020-03-12 DIAGNOSIS — R262 Difficulty in walking, not elsewhere classified: Secondary | ICD-10-CM | POA: Diagnosis not present

## 2020-03-12 DIAGNOSIS — E119 Type 2 diabetes mellitus without complications: Secondary | ICD-10-CM | POA: Diagnosis not present

## 2020-03-12 DIAGNOSIS — I10 Essential (primary) hypertension: Secondary | ICD-10-CM | POA: Diagnosis not present

## 2020-03-12 DIAGNOSIS — R278 Other lack of coordination: Secondary | ICD-10-CM | POA: Diagnosis not present

## 2020-03-12 DIAGNOSIS — I69353 Hemiplegia and hemiparesis following cerebral infarction affecting right non-dominant side: Secondary | ICD-10-CM | POA: Diagnosis not present

## 2020-03-12 DIAGNOSIS — N186 End stage renal disease: Secondary | ICD-10-CM | POA: Diagnosis not present

## 2020-03-12 DIAGNOSIS — M6259 Muscle wasting and atrophy, not elsewhere classified, multiple sites: Secondary | ICD-10-CM | POA: Diagnosis not present

## 2020-03-12 DIAGNOSIS — R2681 Unsteadiness on feet: Secondary | ICD-10-CM | POA: Diagnosis not present

## 2020-03-12 DIAGNOSIS — Z940 Kidney transplant status: Secondary | ICD-10-CM | POA: Diagnosis not present

## 2020-03-12 DIAGNOSIS — R2689 Other abnormalities of gait and mobility: Secondary | ICD-10-CM | POA: Diagnosis not present

## 2020-03-13 DIAGNOSIS — R262 Difficulty in walking, not elsewhere classified: Secondary | ICD-10-CM | POA: Diagnosis not present

## 2020-03-13 DIAGNOSIS — I69353 Hemiplegia and hemiparesis following cerebral infarction affecting right non-dominant side: Secondary | ICD-10-CM | POA: Diagnosis not present

## 2020-03-13 DIAGNOSIS — I10 Essential (primary) hypertension: Secondary | ICD-10-CM | POA: Diagnosis not present

## 2020-03-13 DIAGNOSIS — N186 End stage renal disease: Secondary | ICD-10-CM | POA: Diagnosis not present

## 2020-03-13 DIAGNOSIS — E119 Type 2 diabetes mellitus without complications: Secondary | ICD-10-CM | POA: Diagnosis not present

## 2020-03-13 DIAGNOSIS — R2681 Unsteadiness on feet: Secondary | ICD-10-CM | POA: Diagnosis not present

## 2020-03-13 DIAGNOSIS — M6259 Muscle wasting and atrophy, not elsewhere classified, multiple sites: Secondary | ICD-10-CM | POA: Diagnosis not present

## 2020-03-13 DIAGNOSIS — R278 Other lack of coordination: Secondary | ICD-10-CM | POA: Diagnosis not present

## 2020-03-13 DIAGNOSIS — M6281 Muscle weakness (generalized): Secondary | ICD-10-CM | POA: Diagnosis not present

## 2020-03-13 DIAGNOSIS — R2689 Other abnormalities of gait and mobility: Secondary | ICD-10-CM | POA: Diagnosis not present

## 2020-03-14 DIAGNOSIS — N186 End stage renal disease: Secondary | ICD-10-CM | POA: Diagnosis not present

## 2020-03-14 DIAGNOSIS — M6259 Muscle wasting and atrophy, not elsewhere classified, multiple sites: Secondary | ICD-10-CM | POA: Diagnosis not present

## 2020-03-14 DIAGNOSIS — M6281 Muscle weakness (generalized): Secondary | ICD-10-CM | POA: Diagnosis not present

## 2020-03-14 DIAGNOSIS — I10 Essential (primary) hypertension: Secondary | ICD-10-CM | POA: Diagnosis not present

## 2020-03-14 DIAGNOSIS — I69353 Hemiplegia and hemiparesis following cerebral infarction affecting right non-dominant side: Secondary | ICD-10-CM | POA: Diagnosis not present

## 2020-03-14 DIAGNOSIS — R262 Difficulty in walking, not elsewhere classified: Secondary | ICD-10-CM | POA: Diagnosis not present

## 2020-03-14 DIAGNOSIS — R278 Other lack of coordination: Secondary | ICD-10-CM | POA: Diagnosis not present

## 2020-03-14 DIAGNOSIS — R2689 Other abnormalities of gait and mobility: Secondary | ICD-10-CM | POA: Diagnosis not present

## 2020-03-14 DIAGNOSIS — E119 Type 2 diabetes mellitus without complications: Secondary | ICD-10-CM | POA: Diagnosis not present

## 2020-03-14 DIAGNOSIS — R2681 Unsteadiness on feet: Secondary | ICD-10-CM | POA: Diagnosis not present

## 2020-03-18 DIAGNOSIS — Z940 Kidney transplant status: Secondary | ICD-10-CM | POA: Diagnosis not present

## 2020-03-18 DIAGNOSIS — R2681 Unsteadiness on feet: Secondary | ICD-10-CM | POA: Diagnosis not present

## 2020-03-18 DIAGNOSIS — R2689 Other abnormalities of gait and mobility: Secondary | ICD-10-CM | POA: Diagnosis not present

## 2020-03-18 DIAGNOSIS — M6259 Muscle wasting and atrophy, not elsewhere classified, multiple sites: Secondary | ICD-10-CM | POA: Diagnosis not present

## 2020-03-18 DIAGNOSIS — R278 Other lack of coordination: Secondary | ICD-10-CM | POA: Diagnosis not present

## 2020-03-18 DIAGNOSIS — N186 End stage renal disease: Secondary | ICD-10-CM | POA: Diagnosis not present

## 2020-03-18 DIAGNOSIS — D6861 Antiphospholipid syndrome: Secondary | ICD-10-CM | POA: Diagnosis not present

## 2020-03-18 DIAGNOSIS — E119 Type 2 diabetes mellitus without complications: Secondary | ICD-10-CM | POA: Diagnosis not present

## 2020-03-18 DIAGNOSIS — I69353 Hemiplegia and hemiparesis following cerebral infarction affecting right non-dominant side: Secondary | ICD-10-CM | POA: Diagnosis not present

## 2020-03-18 DIAGNOSIS — M6281 Muscle weakness (generalized): Secondary | ICD-10-CM | POA: Diagnosis not present

## 2020-03-18 DIAGNOSIS — R262 Difficulty in walking, not elsewhere classified: Secondary | ICD-10-CM | POA: Diagnosis not present

## 2020-03-18 DIAGNOSIS — K529 Noninfective gastroenteritis and colitis, unspecified: Secondary | ICD-10-CM | POA: Diagnosis not present

## 2020-03-18 DIAGNOSIS — I10 Essential (primary) hypertension: Secondary | ICD-10-CM | POA: Diagnosis not present

## 2020-03-19 DIAGNOSIS — I69353 Hemiplegia and hemiparesis following cerebral infarction affecting right non-dominant side: Secondary | ICD-10-CM | POA: Diagnosis not present

## 2020-03-19 DIAGNOSIS — R278 Other lack of coordination: Secondary | ICD-10-CM | POA: Diagnosis not present

## 2020-03-19 DIAGNOSIS — R2681 Unsteadiness on feet: Secondary | ICD-10-CM | POA: Diagnosis not present

## 2020-03-19 DIAGNOSIS — M6259 Muscle wasting and atrophy, not elsewhere classified, multiple sites: Secondary | ICD-10-CM | POA: Diagnosis not present

## 2020-03-19 DIAGNOSIS — E119 Type 2 diabetes mellitus without complications: Secondary | ICD-10-CM | POA: Diagnosis not present

## 2020-03-19 DIAGNOSIS — I10 Essential (primary) hypertension: Secondary | ICD-10-CM | POA: Diagnosis not present

## 2020-03-19 DIAGNOSIS — R262 Difficulty in walking, not elsewhere classified: Secondary | ICD-10-CM | POA: Diagnosis not present

## 2020-03-19 DIAGNOSIS — N186 End stage renal disease: Secondary | ICD-10-CM | POA: Diagnosis not present

## 2020-03-19 DIAGNOSIS — R2689 Other abnormalities of gait and mobility: Secondary | ICD-10-CM | POA: Diagnosis not present

## 2020-03-19 DIAGNOSIS — M6281 Muscle weakness (generalized): Secondary | ICD-10-CM | POA: Diagnosis not present

## 2020-03-20 DIAGNOSIS — R2681 Unsteadiness on feet: Secondary | ICD-10-CM | POA: Diagnosis not present

## 2020-03-20 DIAGNOSIS — M6281 Muscle weakness (generalized): Secondary | ICD-10-CM | POA: Diagnosis not present

## 2020-03-20 DIAGNOSIS — R278 Other lack of coordination: Secondary | ICD-10-CM | POA: Diagnosis not present

## 2020-03-20 DIAGNOSIS — N186 End stage renal disease: Secondary | ICD-10-CM | POA: Diagnosis not present

## 2020-03-20 DIAGNOSIS — M6259 Muscle wasting and atrophy, not elsewhere classified, multiple sites: Secondary | ICD-10-CM | POA: Diagnosis not present

## 2020-03-20 DIAGNOSIS — I69353 Hemiplegia and hemiparesis following cerebral infarction affecting right non-dominant side: Secondary | ICD-10-CM | POA: Diagnosis not present

## 2020-03-20 DIAGNOSIS — I10 Essential (primary) hypertension: Secondary | ICD-10-CM | POA: Diagnosis not present

## 2020-03-20 DIAGNOSIS — R2689 Other abnormalities of gait and mobility: Secondary | ICD-10-CM | POA: Diagnosis not present

## 2020-03-20 DIAGNOSIS — R262 Difficulty in walking, not elsewhere classified: Secondary | ICD-10-CM | POA: Diagnosis not present

## 2020-03-20 DIAGNOSIS — E119 Type 2 diabetes mellitus without complications: Secondary | ICD-10-CM | POA: Diagnosis not present

## 2020-03-21 ENCOUNTER — Ambulatory Visit: Payer: Medicare Other | Admitting: Podiatry

## 2020-03-21 DIAGNOSIS — E119 Type 2 diabetes mellitus without complications: Secondary | ICD-10-CM | POA: Diagnosis not present

## 2020-03-21 DIAGNOSIS — N186 End stage renal disease: Secondary | ICD-10-CM | POA: Diagnosis not present

## 2020-03-21 DIAGNOSIS — M6259 Muscle wasting and atrophy, not elsewhere classified, multiple sites: Secondary | ICD-10-CM | POA: Diagnosis not present

## 2020-03-21 DIAGNOSIS — I69353 Hemiplegia and hemiparesis following cerebral infarction affecting right non-dominant side: Secondary | ICD-10-CM | POA: Diagnosis not present

## 2020-03-21 DIAGNOSIS — R2681 Unsteadiness on feet: Secondary | ICD-10-CM | POA: Diagnosis not present

## 2020-03-21 DIAGNOSIS — R278 Other lack of coordination: Secondary | ICD-10-CM | POA: Diagnosis not present

## 2020-03-21 DIAGNOSIS — Z20822 Contact with and (suspected) exposure to covid-19: Secondary | ICD-10-CM | POA: Diagnosis not present

## 2020-03-21 DIAGNOSIS — R2689 Other abnormalities of gait and mobility: Secondary | ICD-10-CM | POA: Diagnosis not present

## 2020-03-21 DIAGNOSIS — I10 Essential (primary) hypertension: Secondary | ICD-10-CM | POA: Diagnosis not present

## 2020-03-21 DIAGNOSIS — M6281 Muscle weakness (generalized): Secondary | ICD-10-CM | POA: Diagnosis not present

## 2020-03-21 DIAGNOSIS — R262 Difficulty in walking, not elsewhere classified: Secondary | ICD-10-CM | POA: Diagnosis not present

## 2020-03-22 DIAGNOSIS — E119 Type 2 diabetes mellitus without complications: Secondary | ICD-10-CM | POA: Diagnosis not present

## 2020-03-22 DIAGNOSIS — E039 Hypothyroidism, unspecified: Secondary | ICD-10-CM | POA: Diagnosis not present

## 2020-03-24 DIAGNOSIS — R262 Difficulty in walking, not elsewhere classified: Secondary | ICD-10-CM | POA: Diagnosis not present

## 2020-03-24 DIAGNOSIS — R2681 Unsteadiness on feet: Secondary | ICD-10-CM | POA: Diagnosis not present

## 2020-03-24 DIAGNOSIS — R278 Other lack of coordination: Secondary | ICD-10-CM | POA: Diagnosis not present

## 2020-03-24 DIAGNOSIS — N186 End stage renal disease: Secondary | ICD-10-CM | POA: Diagnosis not present

## 2020-03-24 DIAGNOSIS — R2689 Other abnormalities of gait and mobility: Secondary | ICD-10-CM | POA: Diagnosis not present

## 2020-03-24 DIAGNOSIS — M6281 Muscle weakness (generalized): Secondary | ICD-10-CM | POA: Diagnosis not present

## 2020-03-24 DIAGNOSIS — I69353 Hemiplegia and hemiparesis following cerebral infarction affecting right non-dominant side: Secondary | ICD-10-CM | POA: Diagnosis not present

## 2020-03-24 DIAGNOSIS — I10 Essential (primary) hypertension: Secondary | ICD-10-CM | POA: Diagnosis not present

## 2020-03-24 DIAGNOSIS — M6259 Muscle wasting and atrophy, not elsewhere classified, multiple sites: Secondary | ICD-10-CM | POA: Diagnosis not present

## 2020-03-24 DIAGNOSIS — E119 Type 2 diabetes mellitus without complications: Secondary | ICD-10-CM | POA: Diagnosis not present

## 2020-03-25 DIAGNOSIS — E119 Type 2 diabetes mellitus without complications: Secondary | ICD-10-CM | POA: Diagnosis not present

## 2020-03-26 ENCOUNTER — Ambulatory Visit: Payer: Medicare Other | Admitting: Podiatry

## 2020-03-31 DIAGNOSIS — R051 Acute cough: Secondary | ICD-10-CM | POA: Diagnosis not present

## 2020-03-31 DIAGNOSIS — R0989 Other specified symptoms and signs involving the circulatory and respiratory systems: Secondary | ICD-10-CM | POA: Diagnosis not present

## 2020-04-01 DIAGNOSIS — J069 Acute upper respiratory infection, unspecified: Secondary | ICD-10-CM | POA: Diagnosis not present

## 2020-04-01 DIAGNOSIS — R6883 Chills (without fever): Secondary | ICD-10-CM | POA: Diagnosis not present

## 2020-04-02 DIAGNOSIS — J069 Acute upper respiratory infection, unspecified: Secondary | ICD-10-CM | POA: Diagnosis not present

## 2020-04-03 DIAGNOSIS — J069 Acute upper respiratory infection, unspecified: Secondary | ICD-10-CM | POA: Diagnosis not present

## 2020-04-04 DIAGNOSIS — J069 Acute upper respiratory infection, unspecified: Secondary | ICD-10-CM | POA: Diagnosis not present

## 2020-04-07 DIAGNOSIS — J069 Acute upper respiratory infection, unspecified: Secondary | ICD-10-CM | POA: Diagnosis not present

## 2020-04-14 DIAGNOSIS — E039 Hypothyroidism, unspecified: Secondary | ICD-10-CM | POA: Diagnosis not present

## 2020-04-14 DIAGNOSIS — Z940 Kidney transplant status: Secondary | ICD-10-CM | POA: Diagnosis not present

## 2020-04-14 DIAGNOSIS — E119 Type 2 diabetes mellitus without complications: Secondary | ICD-10-CM | POA: Diagnosis not present

## 2020-04-14 DIAGNOSIS — N186 End stage renal disease: Secondary | ICD-10-CM | POA: Diagnosis not present

## 2020-04-16 DIAGNOSIS — K529 Noninfective gastroenteritis and colitis, unspecified: Secondary | ICD-10-CM | POA: Diagnosis not present

## 2020-04-16 DIAGNOSIS — E118 Type 2 diabetes mellitus with unspecified complications: Secondary | ICD-10-CM | POA: Diagnosis not present

## 2020-04-16 DIAGNOSIS — E1165 Type 2 diabetes mellitus with hyperglycemia: Secondary | ICD-10-CM | POA: Diagnosis not present

## 2020-04-16 DIAGNOSIS — Z9049 Acquired absence of other specified parts of digestive tract: Secondary | ICD-10-CM | POA: Diagnosis not present

## 2020-04-18 DIAGNOSIS — Z03818 Encounter for observation for suspected exposure to other biological agents ruled out: Secondary | ICD-10-CM | POA: Diagnosis not present

## 2020-04-18 DIAGNOSIS — E785 Hyperlipidemia, unspecified: Secondary | ICD-10-CM | POA: Diagnosis not present

## 2020-04-22 DIAGNOSIS — I110 Hypertensive heart disease with heart failure: Secondary | ICD-10-CM | POA: Diagnosis not present

## 2020-04-22 DIAGNOSIS — E0822 Diabetes mellitus due to underlying condition with diabetic chronic kidney disease: Secondary | ICD-10-CM | POA: Diagnosis not present

## 2020-04-22 DIAGNOSIS — I69353 Hemiplegia and hemiparesis following cerebral infarction affecting right non-dominant side: Secondary | ICD-10-CM | POA: Diagnosis not present

## 2020-05-09 DIAGNOSIS — R262 Difficulty in walking, not elsewhere classified: Secondary | ICD-10-CM | POA: Diagnosis not present

## 2020-05-09 DIAGNOSIS — E119 Type 2 diabetes mellitus without complications: Secondary | ICD-10-CM | POA: Diagnosis not present

## 2020-05-09 DIAGNOSIS — R2681 Unsteadiness on feet: Secondary | ICD-10-CM | POA: Diagnosis not present

## 2020-05-09 DIAGNOSIS — I10 Essential (primary) hypertension: Secondary | ICD-10-CM | POA: Diagnosis not present

## 2020-05-09 DIAGNOSIS — M6281 Muscle weakness (generalized): Secondary | ICD-10-CM | POA: Diagnosis not present

## 2020-05-09 DIAGNOSIS — R2689 Other abnormalities of gait and mobility: Secondary | ICD-10-CM | POA: Diagnosis not present

## 2020-05-09 DIAGNOSIS — M6259 Muscle wasting and atrophy, not elsewhere classified, multiple sites: Secondary | ICD-10-CM | POA: Diagnosis not present

## 2020-05-09 DIAGNOSIS — R278 Other lack of coordination: Secondary | ICD-10-CM | POA: Diagnosis not present

## 2020-05-09 DIAGNOSIS — N186 End stage renal disease: Secondary | ICD-10-CM | POA: Diagnosis not present

## 2020-05-09 DIAGNOSIS — I69353 Hemiplegia and hemiparesis following cerebral infarction affecting right non-dominant side: Secondary | ICD-10-CM | POA: Diagnosis not present

## 2020-05-12 DIAGNOSIS — N186 End stage renal disease: Secondary | ICD-10-CM | POA: Diagnosis not present

## 2020-05-12 DIAGNOSIS — R2681 Unsteadiness on feet: Secondary | ICD-10-CM | POA: Diagnosis not present

## 2020-05-12 DIAGNOSIS — I10 Essential (primary) hypertension: Secondary | ICD-10-CM | POA: Diagnosis not present

## 2020-05-12 DIAGNOSIS — M6281 Muscle weakness (generalized): Secondary | ICD-10-CM | POA: Diagnosis not present

## 2020-05-12 DIAGNOSIS — E119 Type 2 diabetes mellitus without complications: Secondary | ICD-10-CM | POA: Diagnosis not present

## 2020-05-12 DIAGNOSIS — R2689 Other abnormalities of gait and mobility: Secondary | ICD-10-CM | POA: Diagnosis not present

## 2020-05-12 DIAGNOSIS — R278 Other lack of coordination: Secondary | ICD-10-CM | POA: Diagnosis not present

## 2020-05-12 DIAGNOSIS — I69353 Hemiplegia and hemiparesis following cerebral infarction affecting right non-dominant side: Secondary | ICD-10-CM | POA: Diagnosis not present

## 2020-05-12 DIAGNOSIS — M6259 Muscle wasting and atrophy, not elsewhere classified, multiple sites: Secondary | ICD-10-CM | POA: Diagnosis not present

## 2020-05-12 DIAGNOSIS — R262 Difficulty in walking, not elsewhere classified: Secondary | ICD-10-CM | POA: Diagnosis not present

## 2020-05-13 DIAGNOSIS — M6281 Muscle weakness (generalized): Secondary | ICD-10-CM | POA: Diagnosis not present

## 2020-05-13 DIAGNOSIS — R2681 Unsteadiness on feet: Secondary | ICD-10-CM | POA: Diagnosis not present

## 2020-05-13 DIAGNOSIS — R946 Abnormal results of thyroid function studies: Secondary | ICD-10-CM | POA: Diagnosis not present

## 2020-05-13 DIAGNOSIS — N186 End stage renal disease: Secondary | ICD-10-CM | POA: Diagnosis not present

## 2020-05-13 DIAGNOSIS — R2689 Other abnormalities of gait and mobility: Secondary | ICD-10-CM | POA: Diagnosis not present

## 2020-05-13 DIAGNOSIS — Z940 Kidney transplant status: Secondary | ICD-10-CM | POA: Diagnosis not present

## 2020-05-13 DIAGNOSIS — E119 Type 2 diabetes mellitus without complications: Secondary | ICD-10-CM | POA: Diagnosis not present

## 2020-05-13 DIAGNOSIS — I69353 Hemiplegia and hemiparesis following cerebral infarction affecting right non-dominant side: Secondary | ICD-10-CM | POA: Diagnosis not present

## 2020-05-13 DIAGNOSIS — R278 Other lack of coordination: Secondary | ICD-10-CM | POA: Diagnosis not present

## 2020-05-13 DIAGNOSIS — R262 Difficulty in walking, not elsewhere classified: Secondary | ICD-10-CM | POA: Diagnosis not present

## 2020-05-13 DIAGNOSIS — I10 Essential (primary) hypertension: Secondary | ICD-10-CM | POA: Diagnosis not present

## 2020-05-13 DIAGNOSIS — E785 Hyperlipidemia, unspecified: Secondary | ICD-10-CM | POA: Diagnosis not present

## 2020-05-13 DIAGNOSIS — M6259 Muscle wasting and atrophy, not elsewhere classified, multiple sites: Secondary | ICD-10-CM | POA: Diagnosis not present

## 2020-05-14 DIAGNOSIS — N186 End stage renal disease: Secondary | ICD-10-CM | POA: Diagnosis not present

## 2020-05-14 DIAGNOSIS — R2689 Other abnormalities of gait and mobility: Secondary | ICD-10-CM | POA: Diagnosis not present

## 2020-05-14 DIAGNOSIS — R278 Other lack of coordination: Secondary | ICD-10-CM | POA: Diagnosis not present

## 2020-05-14 DIAGNOSIS — R2681 Unsteadiness on feet: Secondary | ICD-10-CM | POA: Diagnosis not present

## 2020-05-14 DIAGNOSIS — R262 Difficulty in walking, not elsewhere classified: Secondary | ICD-10-CM | POA: Diagnosis not present

## 2020-05-14 DIAGNOSIS — E119 Type 2 diabetes mellitus without complications: Secondary | ICD-10-CM | POA: Diagnosis not present

## 2020-05-14 DIAGNOSIS — I69353 Hemiplegia and hemiparesis following cerebral infarction affecting right non-dominant side: Secondary | ICD-10-CM | POA: Diagnosis not present

## 2020-05-14 DIAGNOSIS — I10 Essential (primary) hypertension: Secondary | ICD-10-CM | POA: Diagnosis not present

## 2020-05-14 DIAGNOSIS — M6259 Muscle wasting and atrophy, not elsewhere classified, multiple sites: Secondary | ICD-10-CM | POA: Diagnosis not present

## 2020-05-14 DIAGNOSIS — M6281 Muscle weakness (generalized): Secondary | ICD-10-CM | POA: Diagnosis not present

## 2020-05-15 DIAGNOSIS — R2689 Other abnormalities of gait and mobility: Secondary | ICD-10-CM | POA: Diagnosis not present

## 2020-05-15 DIAGNOSIS — I69353 Hemiplegia and hemiparesis following cerebral infarction affecting right non-dominant side: Secondary | ICD-10-CM | POA: Diagnosis not present

## 2020-05-15 DIAGNOSIS — R262 Difficulty in walking, not elsewhere classified: Secondary | ICD-10-CM | POA: Diagnosis not present

## 2020-05-15 DIAGNOSIS — M6281 Muscle weakness (generalized): Secondary | ICD-10-CM | POA: Diagnosis not present

## 2020-05-15 DIAGNOSIS — M6259 Muscle wasting and atrophy, not elsewhere classified, multiple sites: Secondary | ICD-10-CM | POA: Diagnosis not present

## 2020-05-15 DIAGNOSIS — N186 End stage renal disease: Secondary | ICD-10-CM | POA: Diagnosis not present

## 2020-05-15 DIAGNOSIS — E119 Type 2 diabetes mellitus without complications: Secondary | ICD-10-CM | POA: Diagnosis not present

## 2020-05-15 DIAGNOSIS — I10 Essential (primary) hypertension: Secondary | ICD-10-CM | POA: Diagnosis not present

## 2020-05-15 DIAGNOSIS — R278 Other lack of coordination: Secondary | ICD-10-CM | POA: Diagnosis not present

## 2020-05-15 DIAGNOSIS — R2681 Unsteadiness on feet: Secondary | ICD-10-CM | POA: Diagnosis not present

## 2020-05-16 DIAGNOSIS — E119 Type 2 diabetes mellitus without complications: Secondary | ICD-10-CM | POA: Diagnosis not present

## 2020-05-16 DIAGNOSIS — N186 End stage renal disease: Secondary | ICD-10-CM | POA: Diagnosis not present

## 2020-05-16 DIAGNOSIS — R2681 Unsteadiness on feet: Secondary | ICD-10-CM | POA: Diagnosis not present

## 2020-05-16 DIAGNOSIS — R2689 Other abnormalities of gait and mobility: Secondary | ICD-10-CM | POA: Diagnosis not present

## 2020-05-16 DIAGNOSIS — I69353 Hemiplegia and hemiparesis following cerebral infarction affecting right non-dominant side: Secondary | ICD-10-CM | POA: Diagnosis not present

## 2020-05-16 DIAGNOSIS — M6259 Muscle wasting and atrophy, not elsewhere classified, multiple sites: Secondary | ICD-10-CM | POA: Diagnosis not present

## 2020-05-16 DIAGNOSIS — R262 Difficulty in walking, not elsewhere classified: Secondary | ICD-10-CM | POA: Diagnosis not present

## 2020-05-16 DIAGNOSIS — M6281 Muscle weakness (generalized): Secondary | ICD-10-CM | POA: Diagnosis not present

## 2020-05-16 DIAGNOSIS — R278 Other lack of coordination: Secondary | ICD-10-CM | POA: Diagnosis not present

## 2020-05-16 DIAGNOSIS — I10 Essential (primary) hypertension: Secondary | ICD-10-CM | POA: Diagnosis not present

## 2020-05-17 DIAGNOSIS — M6259 Muscle wasting and atrophy, not elsewhere classified, multiple sites: Secondary | ICD-10-CM | POA: Diagnosis not present

## 2020-05-17 DIAGNOSIS — E119 Type 2 diabetes mellitus without complications: Secondary | ICD-10-CM | POA: Diagnosis not present

## 2020-05-17 DIAGNOSIS — I69353 Hemiplegia and hemiparesis following cerebral infarction affecting right non-dominant side: Secondary | ICD-10-CM | POA: Diagnosis not present

## 2020-05-17 DIAGNOSIS — N186 End stage renal disease: Secondary | ICD-10-CM | POA: Diagnosis not present

## 2020-05-17 DIAGNOSIS — R278 Other lack of coordination: Secondary | ICD-10-CM | POA: Diagnosis not present

## 2020-05-17 DIAGNOSIS — M6281 Muscle weakness (generalized): Secondary | ICD-10-CM | POA: Diagnosis not present

## 2020-05-17 DIAGNOSIS — R2681 Unsteadiness on feet: Secondary | ICD-10-CM | POA: Diagnosis not present

## 2020-05-17 DIAGNOSIS — I10 Essential (primary) hypertension: Secondary | ICD-10-CM | POA: Diagnosis not present

## 2020-05-17 DIAGNOSIS — R262 Difficulty in walking, not elsewhere classified: Secondary | ICD-10-CM | POA: Diagnosis not present

## 2020-05-17 DIAGNOSIS — R2689 Other abnormalities of gait and mobility: Secondary | ICD-10-CM | POA: Diagnosis not present

## 2020-05-19 DIAGNOSIS — R2689 Other abnormalities of gait and mobility: Secondary | ICD-10-CM | POA: Diagnosis not present

## 2020-05-19 DIAGNOSIS — R2681 Unsteadiness on feet: Secondary | ICD-10-CM | POA: Diagnosis not present

## 2020-05-19 DIAGNOSIS — N186 End stage renal disease: Secondary | ICD-10-CM | POA: Diagnosis not present

## 2020-05-19 DIAGNOSIS — M6259 Muscle wasting and atrophy, not elsewhere classified, multiple sites: Secondary | ICD-10-CM | POA: Diagnosis not present

## 2020-05-19 DIAGNOSIS — E119 Type 2 diabetes mellitus without complications: Secondary | ICD-10-CM | POA: Diagnosis not present

## 2020-05-19 DIAGNOSIS — I10 Essential (primary) hypertension: Secondary | ICD-10-CM | POA: Diagnosis not present

## 2020-05-19 DIAGNOSIS — M6281 Muscle weakness (generalized): Secondary | ICD-10-CM | POA: Diagnosis not present

## 2020-05-19 DIAGNOSIS — I69353 Hemiplegia and hemiparesis following cerebral infarction affecting right non-dominant side: Secondary | ICD-10-CM | POA: Diagnosis not present

## 2020-05-19 DIAGNOSIS — R278 Other lack of coordination: Secondary | ICD-10-CM | POA: Diagnosis not present

## 2020-05-19 DIAGNOSIS — R262 Difficulty in walking, not elsewhere classified: Secondary | ICD-10-CM | POA: Diagnosis not present

## 2020-05-20 DIAGNOSIS — R2681 Unsteadiness on feet: Secondary | ICD-10-CM | POA: Diagnosis not present

## 2020-05-20 DIAGNOSIS — I10 Essential (primary) hypertension: Secondary | ICD-10-CM | POA: Diagnosis not present

## 2020-05-20 DIAGNOSIS — R278 Other lack of coordination: Secondary | ICD-10-CM | POA: Diagnosis not present

## 2020-05-20 DIAGNOSIS — M6281 Muscle weakness (generalized): Secondary | ICD-10-CM | POA: Diagnosis not present

## 2020-05-20 DIAGNOSIS — N186 End stage renal disease: Secondary | ICD-10-CM | POA: Diagnosis not present

## 2020-05-20 DIAGNOSIS — R262 Difficulty in walking, not elsewhere classified: Secondary | ICD-10-CM | POA: Diagnosis not present

## 2020-05-20 DIAGNOSIS — M6259 Muscle wasting and atrophy, not elsewhere classified, multiple sites: Secondary | ICD-10-CM | POA: Diagnosis not present

## 2020-05-20 DIAGNOSIS — E119 Type 2 diabetes mellitus without complications: Secondary | ICD-10-CM | POA: Diagnosis not present

## 2020-05-20 DIAGNOSIS — I69353 Hemiplegia and hemiparesis following cerebral infarction affecting right non-dominant side: Secondary | ICD-10-CM | POA: Diagnosis not present

## 2020-05-20 DIAGNOSIS — R2689 Other abnormalities of gait and mobility: Secondary | ICD-10-CM | POA: Diagnosis not present

## 2020-05-21 DIAGNOSIS — R262 Difficulty in walking, not elsewhere classified: Secondary | ICD-10-CM | POA: Diagnosis not present

## 2020-05-21 DIAGNOSIS — M6259 Muscle wasting and atrophy, not elsewhere classified, multiple sites: Secondary | ICD-10-CM | POA: Diagnosis not present

## 2020-05-21 DIAGNOSIS — R278 Other lack of coordination: Secondary | ICD-10-CM | POA: Diagnosis not present

## 2020-05-21 DIAGNOSIS — E119 Type 2 diabetes mellitus without complications: Secondary | ICD-10-CM | POA: Diagnosis not present

## 2020-05-21 DIAGNOSIS — I69353 Hemiplegia and hemiparesis following cerebral infarction affecting right non-dominant side: Secondary | ICD-10-CM | POA: Diagnosis not present

## 2020-05-21 DIAGNOSIS — M6281 Muscle weakness (generalized): Secondary | ICD-10-CM | POA: Diagnosis not present

## 2020-05-21 DIAGNOSIS — R2689 Other abnormalities of gait and mobility: Secondary | ICD-10-CM | POA: Diagnosis not present

## 2020-05-21 DIAGNOSIS — N186 End stage renal disease: Secondary | ICD-10-CM | POA: Diagnosis not present

## 2020-05-21 DIAGNOSIS — R2681 Unsteadiness on feet: Secondary | ICD-10-CM | POA: Diagnosis not present

## 2020-05-21 DIAGNOSIS — I10 Essential (primary) hypertension: Secondary | ICD-10-CM | POA: Diagnosis not present

## 2020-05-23 DIAGNOSIS — R2689 Other abnormalities of gait and mobility: Secondary | ICD-10-CM | POA: Diagnosis not present

## 2020-05-23 DIAGNOSIS — E039 Hypothyroidism, unspecified: Secondary | ICD-10-CM | POA: Diagnosis not present

## 2020-05-23 DIAGNOSIS — I700 Atherosclerosis of aorta: Secondary | ICD-10-CM | POA: Diagnosis not present

## 2020-05-23 DIAGNOSIS — E119 Type 2 diabetes mellitus without complications: Secondary | ICD-10-CM | POA: Diagnosis not present

## 2020-05-23 DIAGNOSIS — N186 End stage renal disease: Secondary | ICD-10-CM | POA: Diagnosis not present

## 2020-05-23 DIAGNOSIS — D6861 Antiphospholipid syndrome: Secondary | ICD-10-CM | POA: Diagnosis not present

## 2020-05-23 DIAGNOSIS — M6281 Muscle weakness (generalized): Secondary | ICD-10-CM | POA: Diagnosis not present

## 2020-05-23 DIAGNOSIS — Z7901 Long term (current) use of anticoagulants: Secondary | ICD-10-CM | POA: Diagnosis not present

## 2020-05-23 DIAGNOSIS — I69353 Hemiplegia and hemiparesis following cerebral infarction affecting right non-dominant side: Secondary | ICD-10-CM | POA: Diagnosis not present

## 2020-05-23 DIAGNOSIS — I10 Essential (primary) hypertension: Secondary | ICD-10-CM | POA: Diagnosis not present

## 2020-05-23 DIAGNOSIS — R262 Difficulty in walking, not elsewhere classified: Secondary | ICD-10-CM | POA: Diagnosis not present

## 2020-05-23 DIAGNOSIS — M6259 Muscle wasting and atrophy, not elsewhere classified, multiple sites: Secondary | ICD-10-CM | POA: Diagnosis not present

## 2020-05-23 DIAGNOSIS — R2681 Unsteadiness on feet: Secondary | ICD-10-CM | POA: Diagnosis not present

## 2020-05-23 DIAGNOSIS — R278 Other lack of coordination: Secondary | ICD-10-CM | POA: Diagnosis not present

## 2020-05-26 DIAGNOSIS — I10 Essential (primary) hypertension: Secondary | ICD-10-CM | POA: Diagnosis not present

## 2020-05-26 DIAGNOSIS — M6281 Muscle weakness (generalized): Secondary | ICD-10-CM | POA: Diagnosis not present

## 2020-05-26 DIAGNOSIS — N186 End stage renal disease: Secondary | ICD-10-CM | POA: Diagnosis not present

## 2020-05-26 DIAGNOSIS — I69353 Hemiplegia and hemiparesis following cerebral infarction affecting right non-dominant side: Secondary | ICD-10-CM | POA: Diagnosis not present

## 2020-05-26 DIAGNOSIS — R2689 Other abnormalities of gait and mobility: Secondary | ICD-10-CM | POA: Diagnosis not present

## 2020-05-26 DIAGNOSIS — M6259 Muscle wasting and atrophy, not elsewhere classified, multiple sites: Secondary | ICD-10-CM | POA: Diagnosis not present

## 2020-05-26 DIAGNOSIS — R2681 Unsteadiness on feet: Secondary | ICD-10-CM | POA: Diagnosis not present

## 2020-05-26 DIAGNOSIS — E119 Type 2 diabetes mellitus without complications: Secondary | ICD-10-CM | POA: Diagnosis not present

## 2020-05-26 DIAGNOSIS — R278 Other lack of coordination: Secondary | ICD-10-CM | POA: Diagnosis not present

## 2020-05-26 DIAGNOSIS — R262 Difficulty in walking, not elsewhere classified: Secondary | ICD-10-CM | POA: Diagnosis not present

## 2020-05-27 DIAGNOSIS — R2689 Other abnormalities of gait and mobility: Secondary | ICD-10-CM | POA: Diagnosis not present

## 2020-05-27 DIAGNOSIS — R2681 Unsteadiness on feet: Secondary | ICD-10-CM | POA: Diagnosis not present

## 2020-05-27 DIAGNOSIS — M6259 Muscle wasting and atrophy, not elsewhere classified, multiple sites: Secondary | ICD-10-CM | POA: Diagnosis not present

## 2020-05-27 DIAGNOSIS — I10 Essential (primary) hypertension: Secondary | ICD-10-CM | POA: Diagnosis not present

## 2020-05-27 DIAGNOSIS — R278 Other lack of coordination: Secondary | ICD-10-CM | POA: Diagnosis not present

## 2020-05-27 DIAGNOSIS — R262 Difficulty in walking, not elsewhere classified: Secondary | ICD-10-CM | POA: Diagnosis not present

## 2020-05-27 DIAGNOSIS — I69353 Hemiplegia and hemiparesis following cerebral infarction affecting right non-dominant side: Secondary | ICD-10-CM | POA: Diagnosis not present

## 2020-05-27 DIAGNOSIS — N186 End stage renal disease: Secondary | ICD-10-CM | POA: Diagnosis not present

## 2020-05-27 DIAGNOSIS — M6281 Muscle weakness (generalized): Secondary | ICD-10-CM | POA: Diagnosis not present

## 2020-05-27 DIAGNOSIS — E119 Type 2 diabetes mellitus without complications: Secondary | ICD-10-CM | POA: Diagnosis not present

## 2020-05-29 DIAGNOSIS — R262 Difficulty in walking, not elsewhere classified: Secondary | ICD-10-CM | POA: Diagnosis not present

## 2020-05-29 DIAGNOSIS — E119 Type 2 diabetes mellitus without complications: Secondary | ICD-10-CM | POA: Diagnosis not present

## 2020-05-29 DIAGNOSIS — R278 Other lack of coordination: Secondary | ICD-10-CM | POA: Diagnosis not present

## 2020-05-29 DIAGNOSIS — M6281 Muscle weakness (generalized): Secondary | ICD-10-CM | POA: Diagnosis not present

## 2020-05-29 DIAGNOSIS — I10 Essential (primary) hypertension: Secondary | ICD-10-CM | POA: Diagnosis not present

## 2020-05-29 DIAGNOSIS — M6259 Muscle wasting and atrophy, not elsewhere classified, multiple sites: Secondary | ICD-10-CM | POA: Diagnosis not present

## 2020-05-29 DIAGNOSIS — I69353 Hemiplegia and hemiparesis following cerebral infarction affecting right non-dominant side: Secondary | ICD-10-CM | POA: Diagnosis not present

## 2020-05-29 DIAGNOSIS — N186 End stage renal disease: Secondary | ICD-10-CM | POA: Diagnosis not present

## 2020-05-29 DIAGNOSIS — R2681 Unsteadiness on feet: Secondary | ICD-10-CM | POA: Diagnosis not present

## 2020-05-29 DIAGNOSIS — R2689 Other abnormalities of gait and mobility: Secondary | ICD-10-CM | POA: Diagnosis not present

## 2020-05-30 DIAGNOSIS — I10 Essential (primary) hypertension: Secondary | ICD-10-CM | POA: Diagnosis not present

## 2020-05-30 DIAGNOSIS — R2681 Unsteadiness on feet: Secondary | ICD-10-CM | POA: Diagnosis not present

## 2020-05-30 DIAGNOSIS — R262 Difficulty in walking, not elsewhere classified: Secondary | ICD-10-CM | POA: Diagnosis not present

## 2020-05-30 DIAGNOSIS — M6259 Muscle wasting and atrophy, not elsewhere classified, multiple sites: Secondary | ICD-10-CM | POA: Diagnosis not present

## 2020-05-30 DIAGNOSIS — N186 End stage renal disease: Secondary | ICD-10-CM | POA: Diagnosis not present

## 2020-05-30 DIAGNOSIS — R2689 Other abnormalities of gait and mobility: Secondary | ICD-10-CM | POA: Diagnosis not present

## 2020-05-30 DIAGNOSIS — I69353 Hemiplegia and hemiparesis following cerebral infarction affecting right non-dominant side: Secondary | ICD-10-CM | POA: Diagnosis not present

## 2020-05-30 DIAGNOSIS — E119 Type 2 diabetes mellitus without complications: Secondary | ICD-10-CM | POA: Diagnosis not present

## 2020-05-30 DIAGNOSIS — M6281 Muscle weakness (generalized): Secondary | ICD-10-CM | POA: Diagnosis not present

## 2020-05-30 DIAGNOSIS — R278 Other lack of coordination: Secondary | ICD-10-CM | POA: Diagnosis not present

## 2020-06-03 DIAGNOSIS — E119 Type 2 diabetes mellitus without complications: Secondary | ICD-10-CM | POA: Diagnosis not present

## 2020-06-03 DIAGNOSIS — N186 End stage renal disease: Secondary | ICD-10-CM | POA: Diagnosis not present

## 2020-06-03 DIAGNOSIS — M6259 Muscle wasting and atrophy, not elsewhere classified, multiple sites: Secondary | ICD-10-CM | POA: Diagnosis not present

## 2020-06-03 DIAGNOSIS — R2681 Unsteadiness on feet: Secondary | ICD-10-CM | POA: Diagnosis not present

## 2020-06-03 DIAGNOSIS — I10 Essential (primary) hypertension: Secondary | ICD-10-CM | POA: Diagnosis not present

## 2020-06-03 DIAGNOSIS — R262 Difficulty in walking, not elsewhere classified: Secondary | ICD-10-CM | POA: Diagnosis not present

## 2020-06-03 DIAGNOSIS — R2689 Other abnormalities of gait and mobility: Secondary | ICD-10-CM | POA: Diagnosis not present

## 2020-06-03 DIAGNOSIS — R278 Other lack of coordination: Secondary | ICD-10-CM | POA: Diagnosis not present

## 2020-06-03 DIAGNOSIS — M6281 Muscle weakness (generalized): Secondary | ICD-10-CM | POA: Diagnosis not present

## 2020-06-03 DIAGNOSIS — I69353 Hemiplegia and hemiparesis following cerebral infarction affecting right non-dominant side: Secondary | ICD-10-CM | POA: Diagnosis not present

## 2020-06-04 DIAGNOSIS — E119 Type 2 diabetes mellitus without complications: Secondary | ICD-10-CM | POA: Diagnosis not present

## 2020-06-04 DIAGNOSIS — N186 End stage renal disease: Secondary | ICD-10-CM | POA: Diagnosis not present

## 2020-06-04 DIAGNOSIS — M6281 Muscle weakness (generalized): Secondary | ICD-10-CM | POA: Diagnosis not present

## 2020-06-04 DIAGNOSIS — I10 Essential (primary) hypertension: Secondary | ICD-10-CM | POA: Diagnosis not present

## 2020-06-04 DIAGNOSIS — R278 Other lack of coordination: Secondary | ICD-10-CM | POA: Diagnosis not present

## 2020-06-04 DIAGNOSIS — R262 Difficulty in walking, not elsewhere classified: Secondary | ICD-10-CM | POA: Diagnosis not present

## 2020-06-04 DIAGNOSIS — R2689 Other abnormalities of gait and mobility: Secondary | ICD-10-CM | POA: Diagnosis not present

## 2020-06-04 DIAGNOSIS — M6259 Muscle wasting and atrophy, not elsewhere classified, multiple sites: Secondary | ICD-10-CM | POA: Diagnosis not present

## 2020-06-04 DIAGNOSIS — R2681 Unsteadiness on feet: Secondary | ICD-10-CM | POA: Diagnosis not present

## 2020-06-04 DIAGNOSIS — I69353 Hemiplegia and hemiparesis following cerebral infarction affecting right non-dominant side: Secondary | ICD-10-CM | POA: Diagnosis not present

## 2020-06-05 DIAGNOSIS — R2681 Unsteadiness on feet: Secondary | ICD-10-CM | POA: Diagnosis not present

## 2020-06-05 DIAGNOSIS — R278 Other lack of coordination: Secondary | ICD-10-CM | POA: Diagnosis not present

## 2020-06-05 DIAGNOSIS — N186 End stage renal disease: Secondary | ICD-10-CM | POA: Diagnosis not present

## 2020-06-05 DIAGNOSIS — E119 Type 2 diabetes mellitus without complications: Secondary | ICD-10-CM | POA: Diagnosis not present

## 2020-06-05 DIAGNOSIS — M6281 Muscle weakness (generalized): Secondary | ICD-10-CM | POA: Diagnosis not present

## 2020-06-05 DIAGNOSIS — M6259 Muscle wasting and atrophy, not elsewhere classified, multiple sites: Secondary | ICD-10-CM | POA: Diagnosis not present

## 2020-06-05 DIAGNOSIS — R2689 Other abnormalities of gait and mobility: Secondary | ICD-10-CM | POA: Diagnosis not present

## 2020-06-05 DIAGNOSIS — I10 Essential (primary) hypertension: Secondary | ICD-10-CM | POA: Diagnosis not present

## 2020-06-05 DIAGNOSIS — R262 Difficulty in walking, not elsewhere classified: Secondary | ICD-10-CM | POA: Diagnosis not present

## 2020-06-05 DIAGNOSIS — I69353 Hemiplegia and hemiparesis following cerebral infarction affecting right non-dominant side: Secondary | ICD-10-CM | POA: Diagnosis not present

## 2020-06-06 DIAGNOSIS — I69353 Hemiplegia and hemiparesis following cerebral infarction affecting right non-dominant side: Secondary | ICD-10-CM | POA: Diagnosis not present

## 2020-06-06 DIAGNOSIS — M6259 Muscle wasting and atrophy, not elsewhere classified, multiple sites: Secondary | ICD-10-CM | POA: Diagnosis not present

## 2020-06-06 DIAGNOSIS — R262 Difficulty in walking, not elsewhere classified: Secondary | ICD-10-CM | POA: Diagnosis not present

## 2020-06-06 DIAGNOSIS — N186 End stage renal disease: Secondary | ICD-10-CM | POA: Diagnosis not present

## 2020-06-06 DIAGNOSIS — I10 Essential (primary) hypertension: Secondary | ICD-10-CM | POA: Diagnosis not present

## 2020-06-06 DIAGNOSIS — M6281 Muscle weakness (generalized): Secondary | ICD-10-CM | POA: Diagnosis not present

## 2020-06-06 DIAGNOSIS — R2681 Unsteadiness on feet: Secondary | ICD-10-CM | POA: Diagnosis not present

## 2020-06-06 DIAGNOSIS — R278 Other lack of coordination: Secondary | ICD-10-CM | POA: Diagnosis not present

## 2020-06-06 DIAGNOSIS — E119 Type 2 diabetes mellitus without complications: Secondary | ICD-10-CM | POA: Diagnosis not present

## 2020-06-06 DIAGNOSIS — R2689 Other abnormalities of gait and mobility: Secondary | ICD-10-CM | POA: Diagnosis not present

## 2020-06-09 DIAGNOSIS — I421 Obstructive hypertrophic cardiomyopathy: Secondary | ICD-10-CM | POA: Diagnosis not present

## 2020-06-09 DIAGNOSIS — M329 Systemic lupus erythematosus, unspecified: Secondary | ICD-10-CM | POA: Diagnosis not present

## 2020-06-09 DIAGNOSIS — Z940 Kidney transplant status: Secondary | ICD-10-CM | POA: Diagnosis not present

## 2020-06-09 DIAGNOSIS — R262 Difficulty in walking, not elsewhere classified: Secondary | ICD-10-CM | POA: Diagnosis not present

## 2020-06-09 DIAGNOSIS — Z7952 Long term (current) use of systemic steroids: Secondary | ICD-10-CM | POA: Diagnosis not present

## 2020-06-09 DIAGNOSIS — R109 Unspecified abdominal pain: Secondary | ICD-10-CM | POA: Diagnosis not present

## 2020-06-09 DIAGNOSIS — Z7901 Long term (current) use of anticoagulants: Secondary | ICD-10-CM | POA: Diagnosis not present

## 2020-06-09 DIAGNOSIS — I10 Essential (primary) hypertension: Secondary | ICD-10-CM | POA: Diagnosis not present

## 2020-06-09 DIAGNOSIS — R278 Other lack of coordination: Secondary | ICD-10-CM | POA: Diagnosis not present

## 2020-06-09 DIAGNOSIS — Z4822 Encounter for aftercare following kidney transplant: Secondary | ICD-10-CM | POA: Diagnosis not present

## 2020-06-09 DIAGNOSIS — E890 Postprocedural hypothyroidism: Secondary | ICD-10-CM | POA: Diagnosis not present

## 2020-06-09 DIAGNOSIS — R197 Diarrhea, unspecified: Secondary | ICD-10-CM | POA: Diagnosis not present

## 2020-06-09 DIAGNOSIS — Z792 Long term (current) use of antibiotics: Secondary | ICD-10-CM | POA: Diagnosis not present

## 2020-06-09 DIAGNOSIS — Z8673 Personal history of transient ischemic attack (TIA), and cerebral infarction without residual deficits: Secondary | ICD-10-CM | POA: Diagnosis not present

## 2020-06-09 DIAGNOSIS — M6281 Muscle weakness (generalized): Secondary | ICD-10-CM | POA: Diagnosis not present

## 2020-06-09 DIAGNOSIS — R2681 Unsteadiness on feet: Secondary | ICD-10-CM | POA: Diagnosis not present

## 2020-06-09 DIAGNOSIS — Z79899 Other long term (current) drug therapy: Secondary | ICD-10-CM | POA: Diagnosis not present

## 2020-06-09 DIAGNOSIS — M6259 Muscle wasting and atrophy, not elsewhere classified, multiple sites: Secondary | ICD-10-CM | POA: Diagnosis not present

## 2020-06-09 DIAGNOSIS — Z23 Encounter for immunization: Secondary | ICD-10-CM | POA: Diagnosis not present

## 2020-06-09 DIAGNOSIS — Z5181 Encounter for therapeutic drug level monitoring: Secondary | ICD-10-CM | POA: Diagnosis not present

## 2020-06-09 DIAGNOSIS — R2689 Other abnormalities of gait and mobility: Secondary | ICD-10-CM | POA: Diagnosis not present

## 2020-06-09 DIAGNOSIS — E119 Type 2 diabetes mellitus without complications: Secondary | ICD-10-CM | POA: Diagnosis not present

## 2020-06-09 DIAGNOSIS — N186 End stage renal disease: Secondary | ICD-10-CM | POA: Diagnosis not present

## 2020-06-09 DIAGNOSIS — E785 Hyperlipidemia, unspecified: Secondary | ICD-10-CM | POA: Diagnosis not present

## 2020-06-09 DIAGNOSIS — I69353 Hemiplegia and hemiparesis following cerebral infarction affecting right non-dominant side: Secondary | ICD-10-CM | POA: Diagnosis not present

## 2020-06-09 DIAGNOSIS — E891 Postprocedural hypoinsulinemia: Secondary | ICD-10-CM | POA: Diagnosis not present

## 2020-06-09 DIAGNOSIS — M109 Gout, unspecified: Secondary | ICD-10-CM | POA: Diagnosis not present

## 2020-06-12 DIAGNOSIS — Z940 Kidney transplant status: Secondary | ICD-10-CM | POA: Diagnosis not present

## 2020-06-14 DIAGNOSIS — D6861 Antiphospholipid syndrome: Secondary | ICD-10-CM | POA: Diagnosis not present

## 2020-06-17 DIAGNOSIS — K529 Noninfective gastroenteritis and colitis, unspecified: Secondary | ICD-10-CM | POA: Diagnosis not present

## 2020-06-17 DIAGNOSIS — R109 Unspecified abdominal pain: Secondary | ICD-10-CM | POA: Diagnosis not present

## 2020-06-17 DIAGNOSIS — R110 Nausea: Secondary | ICD-10-CM | POA: Diagnosis not present

## 2020-06-24 DIAGNOSIS — Z944 Liver transplant status: Secondary | ICD-10-CM | POA: Diagnosis not present

## 2020-06-24 DIAGNOSIS — Z940 Kidney transplant status: Secondary | ICD-10-CM | POA: Diagnosis not present

## 2020-06-24 DIAGNOSIS — T8611 Kidney transplant rejection: Secondary | ICD-10-CM | POA: Diagnosis not present

## 2020-06-27 DIAGNOSIS — D6861 Antiphospholipid syndrome: Secondary | ICD-10-CM | POA: Diagnosis not present

## 2020-06-27 DIAGNOSIS — E039 Hypothyroidism, unspecified: Secondary | ICD-10-CM | POA: Diagnosis not present

## 2020-06-27 DIAGNOSIS — I69353 Hemiplegia and hemiparesis following cerebral infarction affecting right non-dominant side: Secondary | ICD-10-CM | POA: Diagnosis not present

## 2020-07-04 DIAGNOSIS — R109 Unspecified abdominal pain: Secondary | ICD-10-CM | POA: Diagnosis not present

## 2020-07-04 DIAGNOSIS — K529 Noninfective gastroenteritis and colitis, unspecified: Secondary | ICD-10-CM | POA: Diagnosis not present

## 2020-07-11 DIAGNOSIS — D6861 Antiphospholipid syndrome: Secondary | ICD-10-CM | POA: Diagnosis not present

## 2020-07-12 DIAGNOSIS — D6861 Antiphospholipid syndrome: Secondary | ICD-10-CM | POA: Diagnosis not present

## 2020-07-12 DIAGNOSIS — Z940 Kidney transplant status: Secondary | ICD-10-CM | POA: Diagnosis not present

## 2020-07-18 DIAGNOSIS — K047 Periapical abscess without sinus: Secondary | ICD-10-CM | POA: Diagnosis not present

## 2020-07-30 DIAGNOSIS — D6861 Antiphospholipid syndrome: Secondary | ICD-10-CM | POA: Diagnosis not present

## 2020-07-31 DIAGNOSIS — U071 COVID-19: Secondary | ICD-10-CM | POA: Diagnosis not present

## 2020-07-31 DIAGNOSIS — R000 Tachycardia, unspecified: Secondary | ICD-10-CM | POA: Diagnosis not present

## 2020-07-31 DIAGNOSIS — J1282 Pneumonia due to coronavirus disease 2019: Secondary | ICD-10-CM | POA: Diagnosis not present

## 2020-08-07 DIAGNOSIS — R2689 Other abnormalities of gait and mobility: Secondary | ICD-10-CM | POA: Diagnosis not present

## 2020-08-07 DIAGNOSIS — M6281 Muscle weakness (generalized): Secondary | ICD-10-CM | POA: Diagnosis not present

## 2020-08-07 DIAGNOSIS — M6259 Muscle wasting and atrophy, not elsewhere classified, multiple sites: Secondary | ICD-10-CM | POA: Diagnosis not present

## 2020-08-07 DIAGNOSIS — R4781 Slurred speech: Secondary | ICD-10-CM | POA: Diagnosis not present

## 2020-08-07 DIAGNOSIS — R2681 Unsteadiness on feet: Secondary | ICD-10-CM | POA: Diagnosis not present

## 2020-08-07 DIAGNOSIS — E119 Type 2 diabetes mellitus without complications: Secondary | ICD-10-CM | POA: Diagnosis not present

## 2020-08-07 DIAGNOSIS — I10 Essential (primary) hypertension: Secondary | ICD-10-CM | POA: Diagnosis not present

## 2020-08-07 DIAGNOSIS — N186 End stage renal disease: Secondary | ICD-10-CM | POA: Diagnosis not present

## 2020-08-07 DIAGNOSIS — R278 Other lack of coordination: Secondary | ICD-10-CM | POA: Diagnosis not present

## 2020-08-07 DIAGNOSIS — R262 Difficulty in walking, not elsewhere classified: Secondary | ICD-10-CM | POA: Diagnosis not present

## 2020-08-07 DIAGNOSIS — I69353 Hemiplegia and hemiparesis following cerebral infarction affecting right non-dominant side: Secondary | ICD-10-CM | POA: Diagnosis not present

## 2020-08-08 DIAGNOSIS — R2689 Other abnormalities of gait and mobility: Secondary | ICD-10-CM | POA: Diagnosis not present

## 2020-08-08 DIAGNOSIS — R2681 Unsteadiness on feet: Secondary | ICD-10-CM | POA: Diagnosis not present

## 2020-08-08 DIAGNOSIS — R4781 Slurred speech: Secondary | ICD-10-CM | POA: Diagnosis not present

## 2020-08-08 DIAGNOSIS — I69353 Hemiplegia and hemiparesis following cerebral infarction affecting right non-dominant side: Secondary | ICD-10-CM | POA: Diagnosis not present

## 2020-08-08 DIAGNOSIS — R262 Difficulty in walking, not elsewhere classified: Secondary | ICD-10-CM | POA: Diagnosis not present

## 2020-08-08 DIAGNOSIS — I10 Essential (primary) hypertension: Secondary | ICD-10-CM | POA: Diagnosis not present

## 2020-08-08 DIAGNOSIS — M6259 Muscle wasting and atrophy, not elsewhere classified, multiple sites: Secondary | ICD-10-CM | POA: Diagnosis not present

## 2020-08-08 DIAGNOSIS — R278 Other lack of coordination: Secondary | ICD-10-CM | POA: Diagnosis not present

## 2020-08-08 DIAGNOSIS — M6281 Muscle weakness (generalized): Secondary | ICD-10-CM | POA: Diagnosis not present

## 2020-08-08 DIAGNOSIS — H1131 Conjunctival hemorrhage, right eye: Secondary | ICD-10-CM | POA: Diagnosis not present

## 2020-08-08 DIAGNOSIS — E119 Type 2 diabetes mellitus without complications: Secondary | ICD-10-CM | POA: Diagnosis not present

## 2020-08-08 DIAGNOSIS — N186 End stage renal disease: Secondary | ICD-10-CM | POA: Diagnosis not present

## 2020-08-09 DIAGNOSIS — E119 Type 2 diabetes mellitus without complications: Secondary | ICD-10-CM | POA: Diagnosis not present

## 2020-08-09 DIAGNOSIS — I69353 Hemiplegia and hemiparesis following cerebral infarction affecting right non-dominant side: Secondary | ICD-10-CM | POA: Diagnosis not present

## 2020-08-09 DIAGNOSIS — R2689 Other abnormalities of gait and mobility: Secondary | ICD-10-CM | POA: Diagnosis not present

## 2020-08-09 DIAGNOSIS — R2681 Unsteadiness on feet: Secondary | ICD-10-CM | POA: Diagnosis not present

## 2020-08-09 DIAGNOSIS — N186 End stage renal disease: Secondary | ICD-10-CM | POA: Diagnosis not present

## 2020-08-09 DIAGNOSIS — R4781 Slurred speech: Secondary | ICD-10-CM | POA: Diagnosis not present

## 2020-08-09 DIAGNOSIS — R278 Other lack of coordination: Secondary | ICD-10-CM | POA: Diagnosis not present

## 2020-08-09 DIAGNOSIS — D6861 Antiphospholipid syndrome: Secondary | ICD-10-CM | POA: Diagnosis not present

## 2020-08-09 DIAGNOSIS — M6259 Muscle wasting and atrophy, not elsewhere classified, multiple sites: Secondary | ICD-10-CM | POA: Diagnosis not present

## 2020-08-09 DIAGNOSIS — M6281 Muscle weakness (generalized): Secondary | ICD-10-CM | POA: Diagnosis not present

## 2020-08-09 DIAGNOSIS — R262 Difficulty in walking, not elsewhere classified: Secondary | ICD-10-CM | POA: Diagnosis not present

## 2020-08-09 DIAGNOSIS — Z940 Kidney transplant status: Secondary | ICD-10-CM | POA: Diagnosis not present

## 2020-08-09 DIAGNOSIS — I10 Essential (primary) hypertension: Secondary | ICD-10-CM | POA: Diagnosis not present

## 2020-08-10 DIAGNOSIS — I10 Essential (primary) hypertension: Secondary | ICD-10-CM | POA: Diagnosis not present

## 2020-08-10 DIAGNOSIS — I69353 Hemiplegia and hemiparesis following cerebral infarction affecting right non-dominant side: Secondary | ICD-10-CM | POA: Diagnosis not present

## 2020-08-10 DIAGNOSIS — E119 Type 2 diabetes mellitus without complications: Secondary | ICD-10-CM | POA: Diagnosis not present

## 2020-08-10 DIAGNOSIS — R2681 Unsteadiness on feet: Secondary | ICD-10-CM | POA: Diagnosis not present

## 2020-08-10 DIAGNOSIS — R262 Difficulty in walking, not elsewhere classified: Secondary | ICD-10-CM | POA: Diagnosis not present

## 2020-08-10 DIAGNOSIS — M6259 Muscle wasting and atrophy, not elsewhere classified, multiple sites: Secondary | ICD-10-CM | POA: Diagnosis not present

## 2020-08-10 DIAGNOSIS — R2689 Other abnormalities of gait and mobility: Secondary | ICD-10-CM | POA: Diagnosis not present

## 2020-08-10 DIAGNOSIS — R4781 Slurred speech: Secondary | ICD-10-CM | POA: Diagnosis not present

## 2020-08-10 DIAGNOSIS — M6281 Muscle weakness (generalized): Secondary | ICD-10-CM | POA: Diagnosis not present

## 2020-08-10 DIAGNOSIS — N186 End stage renal disease: Secondary | ICD-10-CM | POA: Diagnosis not present

## 2020-08-10 DIAGNOSIS — R278 Other lack of coordination: Secondary | ICD-10-CM | POA: Diagnosis not present

## 2020-08-11 DIAGNOSIS — I10 Essential (primary) hypertension: Secondary | ICD-10-CM | POA: Diagnosis not present

## 2020-08-11 DIAGNOSIS — R278 Other lack of coordination: Secondary | ICD-10-CM | POA: Diagnosis not present

## 2020-08-11 DIAGNOSIS — R4781 Slurred speech: Secondary | ICD-10-CM | POA: Diagnosis not present

## 2020-08-11 DIAGNOSIS — R262 Difficulty in walking, not elsewhere classified: Secondary | ICD-10-CM | POA: Diagnosis not present

## 2020-08-11 DIAGNOSIS — E119 Type 2 diabetes mellitus without complications: Secondary | ICD-10-CM | POA: Diagnosis not present

## 2020-08-11 DIAGNOSIS — I69353 Hemiplegia and hemiparesis following cerebral infarction affecting right non-dominant side: Secondary | ICD-10-CM | POA: Diagnosis not present

## 2020-08-11 DIAGNOSIS — M6281 Muscle weakness (generalized): Secondary | ICD-10-CM | POA: Diagnosis not present

## 2020-08-11 DIAGNOSIS — N186 End stage renal disease: Secondary | ICD-10-CM | POA: Diagnosis not present

## 2020-08-11 DIAGNOSIS — M6259 Muscle wasting and atrophy, not elsewhere classified, multiple sites: Secondary | ICD-10-CM | POA: Diagnosis not present

## 2020-08-11 DIAGNOSIS — R2689 Other abnormalities of gait and mobility: Secondary | ICD-10-CM | POA: Diagnosis not present

## 2020-08-11 DIAGNOSIS — R2681 Unsteadiness on feet: Secondary | ICD-10-CM | POA: Diagnosis not present

## 2020-08-12 DIAGNOSIS — R2689 Other abnormalities of gait and mobility: Secondary | ICD-10-CM | POA: Diagnosis not present

## 2020-08-12 DIAGNOSIS — I10 Essential (primary) hypertension: Secondary | ICD-10-CM | POA: Diagnosis not present

## 2020-08-12 DIAGNOSIS — R2681 Unsteadiness on feet: Secondary | ICD-10-CM | POA: Diagnosis not present

## 2020-08-12 DIAGNOSIS — R278 Other lack of coordination: Secondary | ICD-10-CM | POA: Diagnosis not present

## 2020-08-12 DIAGNOSIS — R4781 Slurred speech: Secondary | ICD-10-CM | POA: Diagnosis not present

## 2020-08-12 DIAGNOSIS — M6259 Muscle wasting and atrophy, not elsewhere classified, multiple sites: Secondary | ICD-10-CM | POA: Diagnosis not present

## 2020-08-12 DIAGNOSIS — N186 End stage renal disease: Secondary | ICD-10-CM | POA: Diagnosis not present

## 2020-08-12 DIAGNOSIS — E119 Type 2 diabetes mellitus without complications: Secondary | ICD-10-CM | POA: Diagnosis not present

## 2020-08-12 DIAGNOSIS — E0822 Diabetes mellitus due to underlying condition with diabetic chronic kidney disease: Secondary | ICD-10-CM | POA: Diagnosis not present

## 2020-08-12 DIAGNOSIS — R262 Difficulty in walking, not elsewhere classified: Secondary | ICD-10-CM | POA: Diagnosis not present

## 2020-08-12 DIAGNOSIS — I110 Hypertensive heart disease with heart failure: Secondary | ICD-10-CM | POA: Diagnosis not present

## 2020-08-12 DIAGNOSIS — M6281 Muscle weakness (generalized): Secondary | ICD-10-CM | POA: Diagnosis not present

## 2020-08-12 DIAGNOSIS — I69353 Hemiplegia and hemiparesis following cerebral infarction affecting right non-dominant side: Secondary | ICD-10-CM | POA: Diagnosis not present

## 2020-08-14 DIAGNOSIS — I69353 Hemiplegia and hemiparesis following cerebral infarction affecting right non-dominant side: Secondary | ICD-10-CM | POA: Diagnosis not present

## 2020-08-14 DIAGNOSIS — E119 Type 2 diabetes mellitus without complications: Secondary | ICD-10-CM | POA: Diagnosis not present

## 2020-08-14 DIAGNOSIS — R2689 Other abnormalities of gait and mobility: Secondary | ICD-10-CM | POA: Diagnosis not present

## 2020-08-14 DIAGNOSIS — N186 End stage renal disease: Secondary | ICD-10-CM | POA: Diagnosis not present

## 2020-08-14 DIAGNOSIS — R4781 Slurred speech: Secondary | ICD-10-CM | POA: Diagnosis not present

## 2020-08-14 DIAGNOSIS — R278 Other lack of coordination: Secondary | ICD-10-CM | POA: Diagnosis not present

## 2020-08-14 DIAGNOSIS — R2681 Unsteadiness on feet: Secondary | ICD-10-CM | POA: Diagnosis not present

## 2020-08-14 DIAGNOSIS — R262 Difficulty in walking, not elsewhere classified: Secondary | ICD-10-CM | POA: Diagnosis not present

## 2020-08-14 DIAGNOSIS — M6281 Muscle weakness (generalized): Secondary | ICD-10-CM | POA: Diagnosis not present

## 2020-08-14 DIAGNOSIS — M6259 Muscle wasting and atrophy, not elsewhere classified, multiple sites: Secondary | ICD-10-CM | POA: Diagnosis not present

## 2020-08-14 DIAGNOSIS — I10 Essential (primary) hypertension: Secondary | ICD-10-CM | POA: Diagnosis not present

## 2020-08-18 DIAGNOSIS — M6281 Muscle weakness (generalized): Secondary | ICD-10-CM | POA: Diagnosis not present

## 2020-08-18 DIAGNOSIS — M6259 Muscle wasting and atrophy, not elsewhere classified, multiple sites: Secondary | ICD-10-CM | POA: Diagnosis not present

## 2020-08-18 DIAGNOSIS — R4781 Slurred speech: Secondary | ICD-10-CM | POA: Diagnosis not present

## 2020-08-18 DIAGNOSIS — E119 Type 2 diabetes mellitus without complications: Secondary | ICD-10-CM | POA: Diagnosis not present

## 2020-08-18 DIAGNOSIS — N186 End stage renal disease: Secondary | ICD-10-CM | POA: Diagnosis not present

## 2020-08-18 DIAGNOSIS — R278 Other lack of coordination: Secondary | ICD-10-CM | POA: Diagnosis not present

## 2020-08-18 DIAGNOSIS — R262 Difficulty in walking, not elsewhere classified: Secondary | ICD-10-CM | POA: Diagnosis not present

## 2020-08-18 DIAGNOSIS — R2681 Unsteadiness on feet: Secondary | ICD-10-CM | POA: Diagnosis not present

## 2020-08-18 DIAGNOSIS — I69353 Hemiplegia and hemiparesis following cerebral infarction affecting right non-dominant side: Secondary | ICD-10-CM | POA: Diagnosis not present

## 2020-08-18 DIAGNOSIS — R2689 Other abnormalities of gait and mobility: Secondary | ICD-10-CM | POA: Diagnosis not present

## 2020-08-18 DIAGNOSIS — I10 Essential (primary) hypertension: Secondary | ICD-10-CM | POA: Diagnosis not present

## 2020-08-19 DIAGNOSIS — I69353 Hemiplegia and hemiparesis following cerebral infarction affecting right non-dominant side: Secondary | ICD-10-CM | POA: Diagnosis not present

## 2020-08-19 DIAGNOSIS — R2681 Unsteadiness on feet: Secondary | ICD-10-CM | POA: Diagnosis not present

## 2020-08-19 DIAGNOSIS — R262 Difficulty in walking, not elsewhere classified: Secondary | ICD-10-CM | POA: Diagnosis not present

## 2020-08-19 DIAGNOSIS — M6259 Muscle wasting and atrophy, not elsewhere classified, multiple sites: Secondary | ICD-10-CM | POA: Diagnosis not present

## 2020-08-19 DIAGNOSIS — I10 Essential (primary) hypertension: Secondary | ICD-10-CM | POA: Diagnosis not present

## 2020-08-19 DIAGNOSIS — R2689 Other abnormalities of gait and mobility: Secondary | ICD-10-CM | POA: Diagnosis not present

## 2020-08-19 DIAGNOSIS — E119 Type 2 diabetes mellitus without complications: Secondary | ICD-10-CM | POA: Diagnosis not present

## 2020-08-19 DIAGNOSIS — R278 Other lack of coordination: Secondary | ICD-10-CM | POA: Diagnosis not present

## 2020-08-19 DIAGNOSIS — R4781 Slurred speech: Secondary | ICD-10-CM | POA: Diagnosis not present

## 2020-08-19 DIAGNOSIS — M6281 Muscle weakness (generalized): Secondary | ICD-10-CM | POA: Diagnosis not present

## 2020-08-19 DIAGNOSIS — N186 End stage renal disease: Secondary | ICD-10-CM | POA: Diagnosis not present

## 2020-08-20 DIAGNOSIS — R2689 Other abnormalities of gait and mobility: Secondary | ICD-10-CM | POA: Diagnosis not present

## 2020-08-20 DIAGNOSIS — I69353 Hemiplegia and hemiparesis following cerebral infarction affecting right non-dominant side: Secondary | ICD-10-CM | POA: Diagnosis not present

## 2020-08-20 DIAGNOSIS — M6259 Muscle wasting and atrophy, not elsewhere classified, multiple sites: Secondary | ICD-10-CM | POA: Diagnosis not present

## 2020-08-20 DIAGNOSIS — I10 Essential (primary) hypertension: Secondary | ICD-10-CM | POA: Diagnosis not present

## 2020-08-20 DIAGNOSIS — R2681 Unsteadiness on feet: Secondary | ICD-10-CM | POA: Diagnosis not present

## 2020-08-20 DIAGNOSIS — E119 Type 2 diabetes mellitus without complications: Secondary | ICD-10-CM | POA: Diagnosis not present

## 2020-08-20 DIAGNOSIS — N186 End stage renal disease: Secondary | ICD-10-CM | POA: Diagnosis not present

## 2020-08-20 DIAGNOSIS — M6281 Muscle weakness (generalized): Secondary | ICD-10-CM | POA: Diagnosis not present

## 2020-08-20 DIAGNOSIS — R4781 Slurred speech: Secondary | ICD-10-CM | POA: Diagnosis not present

## 2020-08-20 DIAGNOSIS — D6861 Antiphospholipid syndrome: Secondary | ICD-10-CM | POA: Diagnosis not present

## 2020-08-20 DIAGNOSIS — R262 Difficulty in walking, not elsewhere classified: Secondary | ICD-10-CM | POA: Diagnosis not present

## 2020-08-20 DIAGNOSIS — R278 Other lack of coordination: Secondary | ICD-10-CM | POA: Diagnosis not present

## 2020-08-21 ENCOUNTER — Encounter (HOSPITAL_COMMUNITY): Payer: Self-pay

## 2020-08-21 ENCOUNTER — Emergency Department (HOSPITAL_COMMUNITY): Payer: Medicare Other

## 2020-08-21 ENCOUNTER — Emergency Department (HOSPITAL_COMMUNITY)
Admission: EM | Admit: 2020-08-21 | Discharge: 2020-08-21 | Disposition: A | Discharge status: Home / Self Care | Payer: Medicare Other | Attending: Emergency Medicine | Admitting: Emergency Medicine

## 2020-08-21 DIAGNOSIS — R1012 Left upper quadrant pain: Secondary | ICD-10-CM | POA: Diagnosis not present

## 2020-08-21 DIAGNOSIS — E1122 Type 2 diabetes mellitus with diabetic chronic kidney disease: Secondary | ICD-10-CM | POA: Diagnosis not present

## 2020-08-21 DIAGNOSIS — R079 Chest pain, unspecified: Secondary | ICD-10-CM | POA: Diagnosis not present

## 2020-08-21 DIAGNOSIS — Z79899 Other long term (current) drug therapy: Secondary | ICD-10-CM | POA: Diagnosis not present

## 2020-08-21 DIAGNOSIS — R6889 Other general symptoms and signs: Secondary | ICD-10-CM | POA: Diagnosis not present

## 2020-08-21 DIAGNOSIS — Z7401 Bed confinement status: Secondary | ICD-10-CM | POA: Diagnosis not present

## 2020-08-21 DIAGNOSIS — Z7901 Long term (current) use of anticoagulants: Secondary | ICD-10-CM | POA: Insufficient documentation

## 2020-08-21 DIAGNOSIS — R000 Tachycardia, unspecified: Secondary | ICD-10-CM | POA: Insufficient documentation

## 2020-08-21 DIAGNOSIS — R600 Localized edema: Secondary | ICD-10-CM | POA: Diagnosis not present

## 2020-08-21 DIAGNOSIS — K5730 Diverticulosis of large intestine without perforation or abscess without bleeding: Secondary | ICD-10-CM | POA: Diagnosis not present

## 2020-08-21 DIAGNOSIS — R109 Unspecified abdominal pain: Secondary | ICD-10-CM | POA: Diagnosis not present

## 2020-08-21 DIAGNOSIS — N189 Chronic kidney disease, unspecified: Secondary | ICD-10-CM | POA: Insufficient documentation

## 2020-08-21 DIAGNOSIS — I5032 Chronic diastolic (congestive) heart failure: Secondary | ICD-10-CM | POA: Insufficient documentation

## 2020-08-21 DIAGNOSIS — Z743 Need for continuous supervision: Secondary | ICD-10-CM | POA: Diagnosis not present

## 2020-08-21 DIAGNOSIS — Z7984 Long term (current) use of oral hypoglycemic drugs: Secondary | ICD-10-CM | POA: Diagnosis not present

## 2020-08-21 DIAGNOSIS — I130 Hypertensive heart and chronic kidney disease with heart failure and stage 1 through stage 4 chronic kidney disease, or unspecified chronic kidney disease: Secondary | ICD-10-CM | POA: Insufficient documentation

## 2020-08-21 DIAGNOSIS — I700 Atherosclerosis of aorta: Secondary | ICD-10-CM | POA: Diagnosis not present

## 2020-08-21 DIAGNOSIS — E039 Hypothyroidism, unspecified: Secondary | ICD-10-CM | POA: Insufficient documentation

## 2020-08-21 DIAGNOSIS — G459 Transient cerebral ischemic attack, unspecified: Secondary | ICD-10-CM | POA: Diagnosis not present

## 2020-08-21 DIAGNOSIS — Z9049 Acquired absence of other specified parts of digestive tract: Secondary | ICD-10-CM | POA: Diagnosis not present

## 2020-08-21 DIAGNOSIS — M2550 Pain in unspecified joint: Secondary | ICD-10-CM | POA: Diagnosis not present

## 2020-08-21 DIAGNOSIS — R1010 Upper abdominal pain, unspecified: Secondary | ICD-10-CM | POA: Diagnosis not present

## 2020-08-21 DIAGNOSIS — Z91040 Latex allergy status: Secondary | ICD-10-CM | POA: Insufficient documentation

## 2020-08-21 DIAGNOSIS — R0682 Tachypnea, not elsewhere classified: Secondary | ICD-10-CM | POA: Insufficient documentation

## 2020-08-21 LAB — COMPREHENSIVE METABOLIC PANEL
ALT: 20 U/L (ref 0–44)
AST: 20 U/L (ref 15–41)
Albumin: 3.3 g/dL — ABNORMAL LOW (ref 3.5–5.0)
Alkaline Phosphatase: 50 U/L (ref 38–126)
Anion gap: 10 (ref 5–15)
BUN: 14 mg/dL (ref 6–20)
CO2: 22 mmol/L (ref 22–32)
Calcium: 9.5 mg/dL (ref 8.9–10.3)
Chloride: 107 mmol/L (ref 98–111)
Creatinine, Ser: 0.48 mg/dL (ref 0.44–1.00)
GFR, Estimated: >60 mL/min (ref >60)
Glucose, Bld: 204 mg/dL — ABNORMAL HIGH (ref 70–99)
Potassium: 3.6 mmol/L (ref 3.5–5.1)
Sodium: 139 mmol/L (ref 135–145)
Total Bilirubin: 0.9 mg/dL (ref 0.3–1.2)
Total Protein: 6.5 g/dL (ref 6.5–8.1)

## 2020-08-21 LAB — CBC WITH DIFFERENTIAL/PLATELET
Abs Immature Granulocytes: 0.05 10*3/uL (ref 0.00–0.07)
Basophils Absolute: 0 10*3/uL (ref 0.0–0.1)
Basophils Relative: 0 %
Eosinophils Absolute: 0 10*3/uL (ref 0.0–0.5)
Eosinophils Relative: 0 %
HCT: 42.3 % (ref 36.0–46.0)
Hemoglobin: 12.9 g/dL (ref 12.0–15.0)
Immature Granulocytes: 1 %
Lymphocytes Relative: 21 %
Lymphs Abs: 2.3 10*3/uL (ref 0.7–4.0)
MCH: 28.7 pg (ref 26.0–34.0)
MCHC: 30.5 g/dL (ref 30.0–36.0)
MCV: 94 fL (ref 80.0–100.0)
Monocytes Absolute: 0.5 10*3/uL (ref 0.1–1.0)
Monocytes Relative: 4 %
Neutro Abs: 8 10*3/uL — ABNORMAL HIGH (ref 1.7–7.7)
Neutrophils Relative %: 74 %
Platelets: 189 10*3/uL (ref 150–400)
RBC: 4.5 MIL/uL (ref 3.87–5.11)
RDW: 14.3 % (ref 11.5–15.5)
WBC: 10.9 10*3/uL — ABNORMAL HIGH (ref 4.0–10.5)
nRBC: 0 % (ref 0.0–0.2)

## 2020-08-21 LAB — TROPONIN I (HIGH SENSITIVITY)
Troponin I (High Sensitivity): 9 ng/L (ref <18)
Troponin I (High Sensitivity): 7 ng/L (ref <18)

## 2020-08-21 LAB — MAGNESIUM: Magnesium: 1.2 mg/dL — ABNORMAL LOW (ref 1.7–2.4)

## 2020-08-21 LAB — PROTIME-INR
INR: 1.5 — ABNORMAL HIGH (ref 0.8–1.2)
Prothrombin Time: 17.7 seconds — ABNORMAL HIGH (ref 11.4–15.2)

## 2020-08-21 LAB — CBG MONITORING, ED: Glucose-Capillary: 187 mg/dL — ABNORMAL HIGH (ref 70–99)

## 2020-08-21 LAB — BRAIN NATRIURETIC PEPTIDE: B Natriuretic Peptide: 57.3 pg/mL (ref 0.0–100.0)

## 2020-08-21 MED ORDER — MAGNESIUM SULFATE 2 GM/50ML IV SOLN
2.0000 g | Freq: Once | INTRAVENOUS | Status: AC
  Administered 2020-08-21: 2 g via INTRAVENOUS
  Filled 2020-08-21: qty 50

## 2020-08-21 MED ORDER — FENTANYL CITRATE (PF) 100 MCG/2ML IJ SOLN
50.0000 ug | Freq: Once | INTRAMUSCULAR | Status: AC
Start: 2020-08-21 — End: 2020-08-21
  Administered 2020-08-21: 50 ug via INTRAVENOUS
  Filled 2020-08-21: qty 2

## 2020-08-21 MED ORDER — MORPHINE SULFATE 15 MG PO TABS: 7.5000 mg | ORAL_TABLET | Freq: Four times a day (QID) | ORAL | 0 refills | Status: DC | PRN

## 2020-08-21 MED ORDER — ASPIRIN 81 MG PO CHEW
324.0000 mg | CHEWABLE_TABLET | Freq: Once | ORAL | Status: AC
  Administered 2020-08-21: 324 mg via ORAL
  Filled 2020-08-21: qty 4

## 2020-08-21 MED ORDER — FENTANYL CITRATE (PF) 100 MCG/2ML IJ SOLN
50.0000 ug | Freq: Once | INTRAMUSCULAR | Status: AC
  Administered 2020-08-21: 50 ug via INTRAVENOUS
  Filled 2020-08-21: qty 2

## 2020-08-21 NOTE — ED Notes (Signed)
ED Provider at bedside. 

## 2020-08-21 NOTE — ED Provider Notes (Signed)
[facility] EMERGENCY DEPARTMENT Provider Note   CSN: [id number] Arrival date & time: 08/21/20  1314     History Chief Complaint  Patient presents with  . Chest Pain    Connie Ruiz is a 60 y.o. female.  HPI Patient presents from home with concern for left upper abdominal pain, left lower chest pain.  Onset was yesterday.  Since that time pain has been persistent, with dyspnea secondary to the pain, but otherwise with no new shortness of breath.  Patient has multiple medical issues including end-stage renal disease, now status post renal transplant.  She notes that she has been taking her medication as directed.  In addition to the pain she has some nausea, but no vomiting.  She may have had diarrhea recently. She has been taking Tylenol, tramadol at home with no relief of her pain.  The pain is severe, focal, ascending from the left upper quadrant superiorly. No confusion, no weakness, right-sided pain.    Past Medical History:  Diagnosis Date  . Anxiety   . Candida esophagitis ([place]) 11/12/2014  . CEREBROVASCULAR ACCIDENT, ACUTE 04/15/2010  . CLOSTRIDIUM DIFFICILE COLITIS, HX OF 08/21/2007  . CONGESTIVE HEART FAILURE 08/21/2007  . Current use of long term anticoagulation    Dr. Andree Elk, [facility]  . CVA 04/17/2010  . Depression    Dr. Andree Elk, [place], TYPE II 08/21/2007  . DVT, HX OF 08/21/2007  . GERD 08/21/2007  . GOUT 08/21/2007  . History of stroke with residual effects   . HYPERLIPIDEMIA 08/21/2007  . HYPERTENSION 08/21/2007   Dr. Andree Elk, [place], HX OF 08/22/2007   s/p renal transplant-Dr. Andree Elk, [place] 08/21/2007  . OSTEOPOROSIS 08/21/2007   [facility] at [facility]  . Pulmonary embolism ([place]) 07/16/2010  . Renal failure   . RENAL INSUFFICIENCY 08/21/2007  . Right sided weakness   . Steroid-induced hyperglycemia 11/09/2014  . Tachycardia   . THYROID NODULE, LEFT 04/10/2009    Patient Active Problem List    Diagnosis Date Noted  . 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 Hypothyroidism 01/14/2018  . HLD (hyperlipidemia) 01/14/2018  . CVA (cerebral vascular accident) ([place]) 01/12/2018  . Chest pain 01/11/2018  . Acute respiratory failure with hypoxia ([place]) 07/20/2017  . Abdominal bloating 07/20/2017  . Sinus tachycardia 07/20/2017  . SLE (systemic lupus erythematosus related syndrome) ([place]) 07/20/2017  . CAP (community acquired pneumonia) 07/16/2017  . Screening examination for infectious disease 01/28/2016  . Type 2 diabetes mellitus ([place]) 08/03/2015  . Urinary tract infection 07/20/2015  . Vascular dementia ([place]) 05/08/2015  . Hemiparesis as late effect of cerebrovascular accident (CVA) ([place]) 05/08/2015  . Aphasia as late effect of stroke 05/08/2015  . C. difficile colitis 02/25/2015  . Hypokalemia 02/25/2015  . CKD (chronic kidney disease) 02/25/2015  . Colitis 02/14/2015  . Sepsis ([place]) 02/14/2015  . Nausea vomiting and diarrhea 02/14/2015  . Abdominal pain 02/14/2015  . UTI (lower urinary tract infection) 02/14/2015  . Abdominal pain, chronic, generalized 01/07/2015  . Candida esophagitis ([place]) 11/12/2014  . Abnormal CT of the abdomen   . Steroid-induced hyperglycemia 11/09/2014  . Hypomagnesemia 11/06/2014  . Abdominal pain, acute   . Supratherapeutic INR 11/02/2014  . Jejunitis 11/02/2014  . Myalgia and myositis 04/05/2014  . Wellness examination 04/05/2014  . Menopausal state 04/05/2014  . Palpitations 08/31/2013  . Hyperlipidemia 08/31/2013  . Disorder of heart rhythm 08/13/2013  . TIA (transient ischemic attack) 06/20/2013  . Gout flare: R  elbow and R shoulder 06/20/2013  . Acute encephalopathy 06/14/2013  . Rhabdomyolysis 06/14/2013  . History of stroke with residual effects   . Right sided weakness   . Breast mass, left 04/30/2013  . Contusion of knee, left 10/18/2012  . Depression 10/05/2012  . Encounter for long-term (current) use of other medications  10/04/2012  . Multiple thyroid nodules 06/08/2012  . Risk for falls 05/05/2012  . Physical deconditioning 05/05/2012  . Fever 04/27/2012  . DVT (deep venous thrombosis) ([place]) 06/17/2011  . Expressive aphasia 06/17/2011  . Dysphasia 06/11/2011  . Incontinence of urine 06/11/2011  . Hand pain, left 06/11/2011  . Immunosuppressive management encounter following kidney transplant 25-May-2011  . Hypertension goal BP (blood pressure) < 130/80 05/25/11  . Deceased-donor kidney transplant recipient 05/25/2011  . Arm pain, left 05/07/2011  . Dysfunctional voiding of urine 04/28/2011  . Urge incontinence 04/28/2011  . Urinary urgency 04/28/2011  . Routine general medical examination at a health care facility 04/13/2011  . Anticoagulated 01/08/2011  . Pulmonary embolism ([place]) 07/16/2010  . Cerebral artery occlusion with cerebral infarction ([place]) 04/17/2010  . CEREBROVASCULAR ACCIDENT, ACUTE 04/15/2010  . THYROID NODULE, LEFT 04/10/2009  . Cough 03/26/2009  . ACUTE BRONCHITIS 08/22/2007  . KIDNEY TRANSPLANTATION, HX OF 08/22/2007  . Gout 08/21/2007  . Essential hypertension 08/21/2007  . Chronic diastolic congestive heart failure ([place]) 08/21/2007  . GERD 08/21/2007  . Disorder resulting from impaired renal function 08/21/2007  . LUPUS 08/21/2007  . Osteoporosis 08/21/2007  . DVT, HX OF 08/21/2007  . Enteritis due to Clostridium difficile 08/21/2007    Past Surgical History:  Procedure Laterality Date  . CESAREAN SECTION    . CHOLECYSTECTOMY    . COLONOSCOPY  06/2000   [facility]   . ENTEROSCOPY N/A 11/11/2014   Procedure: ENTEROSCOPY;  Surgeon: Ladene Artist, MD;  Location: [facility];  Service: Endoscopy;  Laterality: N/A;  . KIDNEY TRANSPLANT Right 2009  . RENAL BIOPSY, OPEN  1981  . TUBAL LIGATION       OB History    Gravida  1   Para  1   Term  1   Preterm      AB      Living  1     SAB      IAB      Ectopic      Multiple      Live Births  1            Family History  Problem Relation Age of Onset  . Heart attack Mother   . Heart disease Father   . Asthma Sister   . Asthma Daughter   . Cancer Maternal Grandfather        prostate  . Cancer Paternal Grandfather        colon    Social History   Tobacco Use  . Smoking status: Never Smoker  . Smokeless tobacco: Never Used  Vaping Use  . Vaping Use: Never used  Substance Use Topics  . Alcohol use: No    Alcohol/week: 0.0 standard drinks  . Drug use: No    Home Medications Prior to Admission medications   Medication Sig Start Date End Date Taking? Authorizing Provider  acetaminophen (TYLENOL) 500 MG tablet Take 1,000 mg by mouth daily as needed for mild pain.    [provider]  alendronate (FOSAMAX) 70 MG tablet TAKE 1 TABLET BY MOUTH ONCE A WEEK ON AN EMPTY STOMACH ON SATURDAYS WITH A FULL  GLASS OF WATER. Patient taking differently: Take 70 mg by mouth once a week.  09/17/17   Renato Shin, MD  atorvastatin (LIPITOR) 40 MG tablet Take 40 mg by mouth daily at 6 PM.  07/26/17   [provider]  b complex-vitamin c-folic acid (NEPHRO-VITE) 0.8 MG TABS tablet Take 1 tablet by mouth daily. 05/05/12   [provider]  Blood Glucose Monitoring Suppl (ACCU-CHEK AVIVA PLUS) w/Device KIT 1 Device by Does not apply route daily. 10/13/18   [name], [name], MD  calcitRIOL (ROCALTROL) 0.25 MCG capsule TAKE 1 CAPSULE(0.25 MCG) BY MOUTH DAILY. NO REFILLS WITHOUT APPOINTMENT Patient taking differently: Take 0.25 mcg by mouth daily. TAKE 1 CAPSULE(0.25 MCG) BY MOUTH DAILY. NO REFILLS WITHOUT APPOINTMENT 02/13/19   Renato Shin, MD  diltiazem [facility]) 360 MG 24 hr capsule Take 1 capsule (360 mg total) by mouth daily. 06/27/19   [name], [name], MD  donepezil (ARICEPT) 10 MG tablet Take 10 mg by mouth at bedtime.  11/18/16   [provider]  esomeprazole (NEXIUM) 20 MG capsule TAKE 1 CAPSULE(20 MG) BY MOUTH DAILY Patient taking differently: Take 20 mg by mouth  daily at 12 noon.  07/17/18   Renato Shin, MD  feeding supplement, ENSURE ENLIVE, (ENSURE ENLIVE) LIQD Take 237 mLs by mouth 2 (two) times daily between meals. Patient taking differently: Take 237 mLs by mouth as needed (when pt remembers).  08/22/18   Pokhrel, Corrie Mckusick, MD  furosemide (LASIX) 20 MG tablet Take 20 mg by mouth daily. 04/25/19   [provider]  glucose blood (ONE TOUCH ULTRA TEST) test strip Use to check blood sugar 1 time per day 02/12/15   Renato Shin, MD  levothyroxine (SYNTHROID) 175 MCG tablet TAKE 1 TABLET(175 MCG) BY MOUTH DAILY BEFORE BREAKFAST Patient taking differently: Take 175 mcg by mouth daily before breakfast.  05/15/19   [name], [name], MD  losartan (COZAAR) 100 MG tablet Take 1 tablet (100 mg total) by mouth daily. 01/03/19   [name], [name], MD  magnesium oxide (MAG-OX) 400 MG tablet Take 800 mg by mouth daily.  01/11/17   [provider]  metFORMIN (GLUCOPHAGE-XR) 500 MG 24 hr tablet TAKE 1 TABLET(500 MG) BY MOUTH DAILY WITH BREAKFAST Patient taking differently: Take 500 mg by mouth daily with breakfast.  06/05/19   [name], [name], MD  mirtazapine (REMERON) 30 MG tablet Take 1 tablet (30 mg total) by mouth at bedtime. 07/28/15   Pieter Partridge, DO  mycophenolate (CELLCEPT) 250 MG capsule Take 250 mg by mouth 2 (two) times daily.  07/13/17   [provider]  NONFORMULARY OR COMPOUNDED ITEM [facility]:  Pain Cream - Ketamine 5%, Baclofen 2%, Gabapentin 5%, Lidocaine 5%, Menthol 1%, apply 1-2 grams to affected area 3-4 times a day for pain. 12/18/19   Bronson Ing, DPM  nystatin (MYCOSTATIN) powder Apply 1 g topically 4 (four) times daily as needed. For yeast under breast    [provider]  ondansetron (ZOFRAN ODT) 4 MG disintegrating tablet 42m ODT q4 hours prn nausea/vomiting Patient taking differently: Take 4 mg by mouth every 4 (four) hours as needed for nausea or vomiting.  06/15/15   Mesner, JCorene Cornea MD  [facility] Surgery[facility]  LANCETS [id number] Use to check blood sugar 1 time per day 02/12/15   ERenato Shin MD  Potassium Chloride ER 20 MEQ TBCR Take 1 tablet by mouth daily. NEEDS POTASSIUM RE-CHECK. 06/27/19   JMartinique [name], MD  predniSONE (DELTASONE) 5  MG tablet Take 5 mg by mouth daily with breakfast.  07/13/17   [provider]  QUEtiapine (SEROQUEL) 25 MG tablet Take 1 tablet (25 mg total) by mouth at bedtime. 07/28/15   Pieter Partridge, DO  sertraline (ZOLOFT) 100 MG tablet Take 0.5 tablets (50 mg total) by mouth daily. Patient taking differently: Take 50 mg by mouth daily as needed (anxiety).  01/25/17   Renato Shin, MD  sucralfate (CARAFATE) 1 g tablet Take 1 tablet (1 g total) by mouth 4 (four) times daily for 30 days. 08/22/18 09/11/19  Pokhrel, Corrie Mckusick, MD  tacrolimus (PROGRAF) 1 MG capsule Take 1-2 mg by mouth See admin instructions. 2 mg in the am and 1 mg at night 01/15/18   [provider]  warfarin (COUMADIN) 5 MG tablet TAKE 1/2 TO 1 TABLET BY MOUTH ON [place]; [place], [place], [place], [place] SUNDAY-2.5MG Patient taking differently: Take 4 mg by mouth daily at 6 PM.  03/27/19   [name], [name], MD    Allergies    Oxycodone-acetaminophen, Propoxyphene, Propoxyphene n-acetaminophen, Sulfonamide derivatives, Codeine, Hydrocodone-acetaminophen, Hydromorphone, Other, Oxycodone, Sulfamethoxazole, Tape, Gabapentin, Latex, Metoprolol, Morphine and related, and Rosiglitazone  Review of Systems   Review of Systems  Constitutional:       Per HPI, otherwise negative  HENT:       Per HPI, otherwise negative  Respiratory:       Per HPI, otherwise negative  Cardiovascular:       Per HPI, otherwise negative  Gastrointestinal: Positive for abdominal pain and diarrhea. Negative for vomiting.  Endocrine:       Negative aside from HPI  Genitourinary:       Neg aside from HPI   Musculoskeletal:       Per HPI, otherwise negative  Skin: Negative.   Allergic/Immunologic: Positive for  immunocompromised state.  Neurological: Positive for weakness. Negative for syncope.    Physical Exam Updated Vital Signs BP (!) 159/105 (BP Location: Left Arm)   Pulse (!) 104   Temp 98.1 F (36.7 C) (Oral)   Resp (!) 22   SpO2 98%   Physical Exam Vitals and nursing note reviewed.  Constitutional:      Appearance: She is well-developed. She is obese.     Comments: Uncomfortable appearing obese adult female awake and alert  HENT:     Head: Normocephalic and atraumatic.  Eyes:     Conjunctiva/sclera: Conjunctivae normal.  Cardiovascular:     Rate and Rhythm: Regular rhythm. Tachycardia present.  Pulmonary:     Effort: Tachypnea present.     Breath sounds: Normal breath sounds.  Abdominal:     General: There is no distension.     Palpations: Abdomen is soft.     Comments: Tenderness with guarding throughout the left upper abdomen  Musculoskeletal:     Right lower leg: Edema present.  Skin:    General: Skin is warm and dry.  Neurological:     Mental Status: She is alert and oriented to person, place, and time.     Cranial Nerves: No cranial nerve deficit.     ED Results / Procedures / Treatments   Labs (all labs ordered are listed, but only abnormal results are displayed) Labs Reviewed  COMPREHENSIVE METABOLIC PANEL - Abnormal; Notable for the following components:      Result Value   Glucose, Bld 204 (*)    Albumin 3.3 (*)    All other components within normal limits  MAGNESIUM - Abnormal; Notable  for the following components:   Magnesium 1.2 (*)    All other components within normal limits  CBC WITH DIFFERENTIAL/PLATELET - Abnormal; Notable for the following components:   WBC 10.9 (*)    Neutro Abs 8.0 (*)    All other components within normal limits  PROTIME-INR - Abnormal; Notable for the following components:   Prothrombin Time 17.7 (*)    INR 1.5 (*)    All other components within normal limits  CBG MONITORING, ED - Abnormal; Notable for the following  components:   Glucose-Capillary 187 (*)    All other components within normal limits  BRAIN NATRIURETIC PEPTIDE  I-STAT BETA HCG BLOOD, ED ([facility], [facility], [facility] ONLY)  TROPONIN I (HIGH SENSITIVITY)  TROPONIN I (HIGH SENSITIVITY)    EKG EKG Interpretation  Date/Time:  Thursday August 21 2020 13:17:58 EDT Ventricular Rate:  104 PR Interval:    QRS Duration: 110 QT Interval:  378 QTC Calculation: 498 R Axis:   -61 Text Interpretation: Sinus tachycardia LAD, consider left anterior fascicular block LVH with secondary repolarization abnormality Anterior ST elevation, probably due to LVH Borderline prolonged QT interval Baseline wander Abnormal ECG Confirmed by Carmin Muskrat [phone]) on 08/21/2020 1:23:35 PM   Radiology DG Chest Portable 1 View  Result Date: 08/21/2020 CLINICAL DATA:  Chest pain EXAM: PORTABLE CHEST 1 VIEW COMPARISON:  September 10, 2019 FINDINGS: The heart size and mediastinal contours are within normal limits. Aortic knob calcifications are seen. Both lungs are clear. The visualized skeletal structures are unremarkable. IMPRESSION: No active disease. Electronically Signed   By: Prudencio Pair M.D.   On: 08/21/2020 14:32    Procedures Procedures   Medications Ordered in ED Medications  aspirin chewable tablet 324 mg (324 mg Oral Given 08/21/20 1402)  fentaNYL (SUBLIMAZE) injection 50 mcg (50 mcg Intravenous Given 08/21/20 1400)    ED Course  I have reviewed the triage vital signs and the nursing notes.  Pertinent labs & imaging results that were available during my care of the patient were reviewed by me and considered in my medical decision making (see chart for details).    4:24 PM Patient in no distress.  Adult female with multiple medical issues including prior renal transplant now presents with left upper quadrant pain.  Patient also has some radiation to the chest, but her description of the abdominal pain, absence of early findings concerning for ischemia suggest GI  etiology. Patient has no cutaneous changing suggesting shingles. On signout the patient is awaiting CT scan, repeat evaluation. Dr. Alvino Chapel is aware of the patient.  Final Clinical Impression(s) / ED Diagnoses Final diagnoses:  Left upper quadrant abdominal pain    Rx / DC Orders ED Discharge Orders    None       Carmin Muskrat, MD 08/21/20 1626

## 2020-08-21 NOTE — ED Notes (Signed)
Help get patient on the monitor did ekg shown to Dr Vanita Panda patient is resting with call bell in reach nurse at bedside

## 2020-08-21 NOTE — Discharge Instructions (Signed)
Talk to your kidney doctors about magnesium supplementation since your level was 1.2 today. Watch for development of a rash since you are very tender over the skin and this could be an early shingles.  Follow-up with your doctor for further evaluation of the pain.

## 2020-08-21 NOTE — ED Notes (Signed)
Returned form CT. Daughter remains at bedside.

## 2020-08-21 NOTE — ED Notes (Signed)
Phone report called to [place] [phone], all questions answered. [facility] called for transport and forms filled out and printed.

## 2020-08-21 NOTE — ED Triage Notes (Signed)
Medic from nursing facility for left chest pain and upper abdominal pain x 24 hours, worse with deep breath. EKG, VS and CBG nl. Dialysis patient with right arm fistula. A/o to baseline.

## 2020-08-21 NOTE — ED Notes (Signed)
Physician at bedside.

## 2020-08-25 DIAGNOSIS — R7989 Other specified abnormal findings of blood chemistry: Secondary | ICD-10-CM | POA: Diagnosis not present

## 2020-08-25 DIAGNOSIS — E559 Vitamin D deficiency, unspecified: Secondary | ICD-10-CM | POA: Diagnosis not present

## 2020-08-26 DIAGNOSIS — D6861 Antiphospholipid syndrome: Secondary | ICD-10-CM | POA: Diagnosis not present

## 2020-08-26 DIAGNOSIS — G8929 Other chronic pain: Secondary | ICD-10-CM | POA: Diagnosis not present

## 2020-08-26 DIAGNOSIS — R079 Chest pain, unspecified: Secondary | ICD-10-CM | POA: Diagnosis not present

## 2020-08-26 DIAGNOSIS — R109 Unspecified abdominal pain: Secondary | ICD-10-CM | POA: Diagnosis not present

## 2020-08-28 ENCOUNTER — Encounter (HOSPITAL_COMMUNITY): Payer: Self-pay | Admitting: Emergency Medicine

## 2020-08-28 ENCOUNTER — Other Ambulatory Visit: Payer: Self-pay

## 2020-08-28 ENCOUNTER — Emergency Department (HOSPITAL_COMMUNITY): Payer: Medicare Other

## 2020-08-28 ENCOUNTER — Emergency Department (HOSPITAL_COMMUNITY)
Admission: EM | Admit: 2020-08-28 | Discharge: 2020-08-28 | Disposition: A | Discharge status: Home / Self Care | Payer: Medicare Other | Attending: Emergency Medicine | Admitting: Emergency Medicine

## 2020-08-28 DIAGNOSIS — Z7984 Long term (current) use of oral hypoglycemic drugs: Secondary | ICD-10-CM | POA: Insufficient documentation

## 2020-08-28 DIAGNOSIS — Z91040 Latex allergy status: Secondary | ICD-10-CM | POA: Insufficient documentation

## 2020-08-28 DIAGNOSIS — J1282 Pneumonia due to coronavirus disease 2019: Secondary | ICD-10-CM | POA: Diagnosis not present

## 2020-08-28 DIAGNOSIS — Z7901 Long term (current) use of anticoagulants: Secondary | ICD-10-CM | POA: Diagnosis not present

## 2020-08-28 DIAGNOSIS — I499 Cardiac arrhythmia, unspecified: Secondary | ICD-10-CM | POA: Diagnosis not present

## 2020-08-28 DIAGNOSIS — Z743 Need for continuous supervision: Secondary | ICD-10-CM | POA: Diagnosis not present

## 2020-08-28 DIAGNOSIS — I130 Hypertensive heart and chronic kidney disease with heart failure and stage 1 through stage 4 chronic kidney disease, or unspecified chronic kidney disease: Secondary | ICD-10-CM | POA: Diagnosis not present

## 2020-08-28 DIAGNOSIS — J189 Pneumonia, unspecified organism: Secondary | ICD-10-CM

## 2020-08-28 DIAGNOSIS — I5032 Chronic diastolic (congestive) heart failure: Secondary | ICD-10-CM | POA: Insufficient documentation

## 2020-08-28 DIAGNOSIS — Z79899 Other long term (current) drug therapy: Secondary | ICD-10-CM | POA: Insufficient documentation

## 2020-08-28 DIAGNOSIS — K5730 Diverticulosis of large intestine without perforation or abscess without bleeding: Secondary | ICD-10-CM | POA: Diagnosis not present

## 2020-08-28 DIAGNOSIS — E1122 Type 2 diabetes mellitus with diabetic chronic kidney disease: Secondary | ICD-10-CM | POA: Insufficient documentation

## 2020-08-28 DIAGNOSIS — R079 Chest pain, unspecified: Secondary | ICD-10-CM | POA: Diagnosis not present

## 2020-08-28 DIAGNOSIS — U071 COVID-19: Secondary | ICD-10-CM | POA: Insufficient documentation

## 2020-08-28 DIAGNOSIS — F0390 Unspecified dementia without behavioral disturbance: Secondary | ICD-10-CM | POA: Insufficient documentation

## 2020-08-28 DIAGNOSIS — E039 Hypothyroidism, unspecified: Secondary | ICD-10-CM | POA: Insufficient documentation

## 2020-08-28 DIAGNOSIS — N390 Urinary tract infection, site not specified: Secondary | ICD-10-CM | POA: Diagnosis not present

## 2020-08-28 DIAGNOSIS — R000 Tachycardia, unspecified: Secondary | ICD-10-CM | POA: Insufficient documentation

## 2020-08-28 DIAGNOSIS — N189 Chronic kidney disease, unspecified: Secondary | ICD-10-CM | POA: Diagnosis not present

## 2020-08-28 DIAGNOSIS — R109 Unspecified abdominal pain: Secondary | ICD-10-CM | POA: Diagnosis not present

## 2020-08-28 DIAGNOSIS — R6889 Other general symptoms and signs: Secondary | ICD-10-CM | POA: Diagnosis not present

## 2020-08-28 DIAGNOSIS — Z7401 Bed confinement status: Secondary | ICD-10-CM | POA: Diagnosis not present

## 2020-08-28 DIAGNOSIS — R0902 Hypoxemia: Secondary | ICD-10-CM | POA: Diagnosis not present

## 2020-08-28 DIAGNOSIS — R600 Localized edema: Secondary | ICD-10-CM | POA: Insufficient documentation

## 2020-08-28 DIAGNOSIS — M2550 Pain in unspecified joint: Secondary | ICD-10-CM | POA: Diagnosis not present

## 2020-08-28 DIAGNOSIS — R0602 Shortness of breath: Secondary | ICD-10-CM | POA: Diagnosis not present

## 2020-08-28 LAB — CBC WITH DIFFERENTIAL/PLATELET
Abs Immature Granulocytes: 0.04 10*3/uL (ref 0.00–0.07)
Basophils Absolute: 0 10*3/uL (ref 0.0–0.1)
Basophils Relative: 0 %
Eosinophils Absolute: 0.1 10*3/uL (ref 0.0–0.5)
Eosinophils Relative: 1 %
HCT: 40.2 % (ref 36.0–46.0)
Hemoglobin: 12.4 g/dL (ref 12.0–15.0)
Immature Granulocytes: 0 %
Lymphocytes Relative: 34 %
Lymphs Abs: 4 10*3/uL (ref 0.7–4.0)
MCH: 28.6 pg (ref 26.0–34.0)
MCHC: 30.8 g/dL (ref 30.0–36.0)
MCV: 92.8 fL (ref 80.0–100.0)
Monocytes Absolute: 0.9 10*3/uL (ref 0.1–1.0)
Monocytes Relative: 8 %
Neutro Abs: 6.8 10*3/uL (ref 1.7–7.7)
Neutrophils Relative %: 57 %
Platelets: 223 10*3/uL (ref 150–400)
RBC: 4.33 MIL/uL (ref 3.87–5.11)
RDW: 14.2 % (ref 11.5–15.5)
WBC: 12 10*3/uL — ABNORMAL HIGH (ref 4.0–10.5)
nRBC: 0 % (ref 0.0–0.2)

## 2020-08-28 LAB — PROTIME-INR
INR: 2.4 — ABNORMAL HIGH (ref 0.8–1.2)
Prothrombin Time: 25.6 seconds — ABNORMAL HIGH (ref 11.4–15.2)

## 2020-08-28 LAB — URINALYSIS, ROUTINE W REFLEX MICROSCOPIC
Bilirubin Urine: NEGATIVE
Glucose, UA: NEGATIVE mg/dL
Hgb urine dipstick: NEGATIVE
Ketones, ur: NEGATIVE mg/dL
Leukocytes,Ua: NEGATIVE
Nitrite: NEGATIVE
Protein, ur: NEGATIVE mg/dL
Specific Gravity, Urine: 1.017 (ref 1.005–1.030)
pH: 6 (ref 5.0–8.0)

## 2020-08-28 LAB — LIPASE, BLOOD: Lipase: 27 U/L (ref 11–51)

## 2020-08-28 LAB — COMPREHENSIVE METABOLIC PANEL
ALT: 17 U/L (ref 0–44)
AST: 20 U/L (ref 15–41)
Albumin: 3.3 g/dL — ABNORMAL LOW (ref 3.5–5.0)
Alkaline Phosphatase: 57 U/L (ref 38–126)
Anion gap: 13 (ref 5–15)
BUN: 12 mg/dL (ref 6–20)
CO2: 23 mmol/L (ref 22–32)
Calcium: 8.8 mg/dL — ABNORMAL LOW (ref 8.9–10.3)
Chloride: 104 mmol/L (ref 98–111)
Creatinine, Ser: 0.37 mg/dL — ABNORMAL LOW (ref 0.44–1.00)
GFR, Estimated: >60 mL/min (ref >60)
Glucose, Bld: 178 mg/dL — ABNORMAL HIGH (ref 70–99)
Potassium: 3.2 mmol/L — ABNORMAL LOW (ref 3.5–5.1)
Sodium: 140 mmol/L (ref 135–145)
Total Bilirubin: 0.8 mg/dL (ref 0.3–1.2)
Total Protein: 6.6 g/dL (ref 6.5–8.1)

## 2020-08-28 LAB — LACTIC ACID, PLASMA: Lactic Acid, Venous: 1.9 mmol/L (ref 0.5–1.9)

## 2020-08-28 LAB — RESP PANEL BY RT-PCR (FLU A&B, COVID) ARPGX2
Influenza A by PCR: NEGATIVE
Influenza B by PCR: NEGATIVE
SARS Coronavirus 2 by RT PCR: POSITIVE — AB

## 2020-08-28 MED ORDER — LEVOFLOXACIN IN D5W 750 MG/150ML IV SOLN
750.0000 mg | Freq: Once | INTRAVENOUS | Status: AC
  Administered 2020-08-28: 750 mg via INTRAVENOUS
  Filled 2020-08-28: qty 150

## 2020-08-28 MED ORDER — LEVOFLOXACIN 750 MG PO TABS
750.0000 mg | ORAL_TABLET | Freq: Once | ORAL | Status: DC
  Filled 2020-08-28: qty 1

## 2020-08-28 MED ORDER — MORPHINE SULFATE 15 MG PO TABS: 7.5000 mg | ORAL_TABLET | Freq: Four times a day (QID) | ORAL | 0 refills | Status: DC | PRN

## 2020-08-28 MED ORDER — IOHEXOL 9 MG/ML PO SOLN
ORAL | Status: AC
  Filled 2020-08-28: qty 1000

## 2020-08-28 MED ORDER — FENTANYL CITRATE (PF) 100 MCG/2ML IJ SOLN
50.0000 ug | Freq: Once | INTRAMUSCULAR | Status: AC
Start: 2020-08-28 — End: 2020-08-28
  Administered 2020-08-28: 50 ug via INTRAVENOUS
  Filled 2020-08-28: qty 2

## 2020-08-28 MED ORDER — FENTANYL CITRATE (PF) 100 MCG/2ML IJ SOLN
100.0000 ug | Freq: Once | INTRAMUSCULAR | Status: AC
  Administered 2020-08-28: 100 ug via INTRAVENOUS
  Filled 2020-08-28: qty 2

## 2020-08-28 MED ORDER — MORPHINE SULFATE 15 MG PO TABS
7.5000 mg | ORAL_TABLET | ORAL | Status: DC | PRN
  Administered 2020-08-28: 7.5 mg via ORAL
  Filled 2020-08-28: qty 1

## 2020-08-28 MED ORDER — ONDANSETRON HCL 4 MG/2ML IJ SOLN
4.0000 mg | Freq: Once | INTRAMUSCULAR | Status: AC
  Administered 2020-08-28: 4 mg via INTRAVENOUS
  Filled 2020-08-28: qty 2

## 2020-08-28 MED ORDER — IOHEXOL 9 MG/ML PO SOLN
500.0000 mL | ORAL | Status: AC
  Administered 2020-08-28 (×2): 500 mL via ORAL

## 2020-08-28 MED ORDER — LEVOFLOXACIN 500 MG PO TABS: 500.0000 mg | ORAL_TABLET | Freq: Every day | ORAL | 0 refills | Status: AC

## 2020-08-28 NOTE — Discharge Instructions (Addendum)
Take antibiotics as prescribed and complete the full course. Day 1 dose given in the ER, start prescription tomorrow. Recheck INR in 2 days and monitor closely.

## 2020-08-28 NOTE — ED Notes (Signed)
[facility] contacted for transport back to [facility]

## 2020-08-28 NOTE — ED Provider Notes (Signed)
  Face-to-face evaluation   History: She presents for evaluation of pain which started 6 days ago.  She localizes the pain to the left upper chest and extends to the left lower abdomen.  She is able to give reasonable history.  She denies nausea, vomiting, altered bowel habits.  She states she had a bowel movement this morning.  She lives in a nursing care facility and states that she has not walked in about 3 months because of right leg pain.  She denies shortness of breath or cough at this time.  Physical exam: Morbidly obese female who is alert and conversant.  She resists exam initially but ultimately did let me examine her abdomen.  Right-sided abdomen is nontender.  Left-sided abdomen is exquisitely tender primarily in the left upper quadrant.  Medical screening examination/treatment/procedure(s) were conducted as a shared visit with non-physician practitioner(s) and myself.  I personally evaluated the patient during the encounter    Daleen Bo, MD 08/30/20 [phone]

## 2020-08-28 NOTE — ED Triage Notes (Signed)
Patient BIBA from [facility] c/o left flank pain that radiates to chest and abdomen. She reports the pain is intermittent x10 days. Was seen 1 week ago and [facility] and discharged. Last BM earlier today. Denies n/v/d. Patient is no longer getting dialysis. Hx CVA, CHF, CAD  BP 160/120 HR 110 94% RA 22 RR 203 CBG 98.4 F

## 2020-08-28 NOTE — ED Provider Notes (Addendum)
[place] DEPT Provider Note   CSN: [id number] Arrival date & time: 08/28/20  7322     History No chief complaint on file.   Connie Ruiz is a 60 y.o. female.  60 year old female with history of CVA, diabetes, renal failure (renal transplant), PE (on Coumadin), CHF, brought in by EMS from [facility] for complaint of 10 days left-sided abdomen/chest pain.  Patient states the pain is constant, associated with shortness of breath.  Denies fevers, chills, changes in bowel or bladder habits.  Patient was seen in the emergency room 8 days ago for same, work-up in [place].  Patient was started on antibiotics yesterday for a dental abscess.        Past Medical History:  Diagnosis Date  . Anxiety   . Candida esophagitis ([place]) 11/12/2014  . CEREBROVASCULAR ACCIDENT, ACUTE 04/15/2010  . CLOSTRIDIUM DIFFICILE COLITIS, HX OF 08/21/2007  . CONGESTIVE HEART FAILURE 08/21/2007  . Current use of long term anticoagulation    Dr. Andree Elk, [facility]  . CVA 04/17/2010  . Depression    Dr. Andree Elk, [place], TYPE II 08/21/2007  . DVT, HX OF 08/21/2007  . GERD 08/21/2007  . GOUT 08/21/2007  . History of stroke with residual effects   . HYPERLIPIDEMIA 08/21/2007  . HYPERTENSION 08/21/2007   Dr. Andree Elk, [place], HX OF 08/22/2007   s/p renal transplant-Dr. Andree Elk, [place] 08/21/2007  . OSTEOPOROSIS 08/21/2007   Rheumatol at [facility]  . Pulmonary embolism ([place]) 07/16/2010  . Renal failure   . RENAL INSUFFICIENCY 08/21/2007  . Right sided weakness   . Steroid-induced hyperglycemia 11/09/2014  . Tachycardia   . THYROID NODULE, LEFT 04/10/2009    Patient Active Problem List   Diagnosis Date Noted  . 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 Hypothyroidism 01/14/2018  . HLD (hyperlipidemia) 01/14/2018  . CVA (cerebral vascular accident) ([place]) 01/12/2018  . Chest pain 01/11/2018  . Acute respiratory failure with hypoxia  ([place]) 07/20/2017  . Abdominal bloating 07/20/2017  . Sinus tachycardia 07/20/2017  . SLE (systemic lupus erythematosus related syndrome) ([place]) 07/20/2017  . CAP (community acquired pneumonia) 07/16/2017  . Screening examination for infectious disease 01/28/2016  . Type 2 diabetes mellitus ([place]) 08/03/2015  . Urinary tract infection 07/20/2015  . Vascular dementia ([place]) 05/08/2015  . Hemiparesis as late effect of cerebrovascular accident (CVA) ([place]) 05/08/2015  . Aphasia as late effect of stroke 05/08/2015  . C. difficile colitis 02/25/2015  . Hypokalemia 02/25/2015  . CKD (chronic kidney disease) 02/25/2015  . Colitis 02/14/2015  . Sepsis ([place]) 02/14/2015  . Nausea vomiting and diarrhea 02/14/2015  . Abdominal pain 02/14/2015  . UTI (lower urinary tract infection) 02/14/2015  . Abdominal pain, chronic, generalized 01/07/2015  . Candida esophagitis ([place]) 11/12/2014  . Abnormal CT of the abdomen   . Steroid-induced hyperglycemia 11/09/2014  . Hypomagnesemia 11/06/2014  . Abdominal pain, acute   . Supratherapeutic INR 11/02/2014  . Jejunitis 11/02/2014  . Myalgia and myositis 04/05/2014  . Wellness examination 04/05/2014  . Menopausal state 04/05/2014  . Palpitations 08/31/2013  . Hyperlipidemia 08/31/2013  . Disorder of heart rhythm 08/13/2013  . TIA (transient ischemic attack) 06/20/2013  . Gout flare: R elbow and R shoulder 06/20/2013  . Acute encephalopathy 06/14/2013  . Rhabdomyolysis 06/14/2013  . History of stroke with residual effects   . Right sided weakness   . Breast mass, left 04/30/2013  . Contusion of knee, left 10/18/2012  . Depression 10/05/2012  .  Encounter for long-term (current) use of other medications 10/04/2012  . Multiple thyroid nodules 06/08/2012  . Risk for falls 05/05/2012  . Physical deconditioning 05/05/2012  . Fever 04/27/2012  . DVT (deep venous thrombosis) ([place]) 06/17/2011  . Expressive aphasia 06/17/2011  . Dysphasia 06/11/2011  .  Incontinence of urine 06/11/2011  . Hand pain, left 06/11/2011  . Immunosuppressive management encounter following kidney transplant 23-May-2011  . Hypertension goal BP (blood pressure) < 130/80 23-May-2011  . Deceased-donor kidney transplant recipient May 23, 2011  . Arm pain, left 05/07/2011  . Dysfunctional voiding of urine 04/28/2011  . Urge incontinence 04/28/2011  . Urinary urgency 04/28/2011  . Routine general medical examination at a health care facility 04/13/2011  . Anticoagulated 01/08/2011  . Pulmonary embolism ([place]) 07/16/2010  . Cerebral artery occlusion with cerebral infarction ([place]) 04/17/2010  . CEREBROVASCULAR ACCIDENT, ACUTE 04/15/2010  . THYROID NODULE, LEFT 04/10/2009  . Cough 03/26/2009  . ACUTE BRONCHITIS 08/22/2007  . KIDNEY TRANSPLANTATION, HX OF 08/22/2007  . Gout 08/21/2007  . Essential hypertension 08/21/2007  . Chronic diastolic congestive heart failure ([place]) 08/21/2007  . GERD 08/21/2007  . Disorder resulting from impaired renal function 08/21/2007  . LUPUS 08/21/2007  . Osteoporosis 08/21/2007  . DVT, HX OF 08/21/2007  . Enteritis due to Clostridium difficile 08/21/2007    Past Surgical History:  Procedure Laterality Date  . CESAREAN SECTION    . CHOLECYSTECTOMY    . COLONOSCOPY  06/2000   [facility]   . ENTEROSCOPY N/A 11/11/2014   Procedure: ENTEROSCOPY;  Surgeon: Ladene Artist, MD;  Location: [facility] ENDOSCOPY;  Service: Endoscopy;  Laterality: N/A;  . KIDNEY TRANSPLANT Right 2009  . RENAL BIOPSY, OPEN  1981  . TUBAL LIGATION       OB History    Gravida  1   Para  1   Term  1   Preterm      AB      Living  1     SAB      IAB      Ectopic      Multiple      Live Births  1           Family History  Problem Relation Age of Onset  . Heart attack Mother   . Heart disease Father   . Asthma Sister   . Asthma Daughter   . Cancer Maternal Grandfather        prostate  . Cancer Paternal Grandfather        colon     Social History   Tobacco Use  . Smoking status: Never Smoker  . Smokeless tobacco: Never Used  Vaping Use  . Vaping Use: Never used  Substance Use Topics  . Alcohol use: No    Alcohol/week: 0.0 standard drinks  . Drug use: No    Home Medications Prior to Admission medications   Medication Sig Start Date End Date Taking? Authorizing Provider  levofloxacin (LEVAQUIN) 500 MG tablet Take 1 tablet (500 mg total) by mouth daily for 4 days. 08/28/20 09/01/20 Yes Tacy Learn, PA-C  acetaminophen (TYLENOL) 500 MG tablet Take 1,000 mg by mouth daily as needed for mild pain.    [provider]  alendronate (FOSAMAX) 70 MG tablet TAKE 1 TABLET BY MOUTH ONCE A WEEK ON AN EMPTY STOMACH ON SATURDAYS WITH A FULL GLASS OF WATER. Patient taking differently: Take 70 mg by mouth once a week.  09/17/17   Renato Shin, MD  atorvastatin (  LIPITOR) 40 MG tablet Take 40 mg by mouth daily at 6 PM.  07/26/17   [provider]  b complex-vitamin c-folic acid (NEPHRO-VITE) 0.8 MG TABS tablet Take 1 tablet by mouth daily. 05/05/12   [provider]  Blood Glucose Monitoring Suppl (ACCU-CHEK AVIVA PLUS) w/Device KIT 1 Device by Does not apply route daily. 10/13/18   [name], [name], MD  calcitRIOL (ROCALTROL) 0.25 MCG capsule TAKE 1 CAPSULE(0.25 MCG) BY MOUTH DAILY. NO REFILLS WITHOUT APPOINTMENT Patient taking differently: Take 0.25 mcg by mouth daily. TAKE 1 CAPSULE(0.25 MCG) BY MOUTH DAILY. NO REFILLS WITHOUT APPOINTMENT 02/13/19   Renato Shin, MD  diltiazem [facility]) 360 MG 24 hr capsule Take 1 capsule (360 mg total) by mouth daily. 06/27/19   [name], [name], MD  donepezil (ARICEPT) 10 MG tablet Take 10 mg by mouth at bedtime.  11/18/16   [provider]  esomeprazole (NEXIUM) 20 MG capsule TAKE 1 CAPSULE(20 MG) BY MOUTH DAILY Patient taking differently: Take 20 mg by mouth daily at 12 noon.  07/17/18   Renato Shin, MD  feeding supplement, ENSURE ENLIVE, (ENSURE ENLIVE) LIQD  Take 237 mLs by mouth 2 (two) times daily between meals. Patient taking differently: Take 237 mLs by mouth as needed (when pt remembers).  08/22/18   Pokhrel, Corrie Mckusick, MD  furosemide (LASIX) 20 MG tablet Take 20 mg by mouth daily. 04/25/19   [provider]  glucose blood (ONE TOUCH ULTRA TEST) test strip Use to check blood sugar 1 time per day 02/12/15   Renato Shin, MD  levothyroxine (SYNTHROID) 175 MCG tablet TAKE 1 TABLET(175 MCG) BY MOUTH DAILY BEFORE BREAKFAST Patient taking differently: Take 175 mcg by mouth daily before breakfast.  05/15/19   [name], [name], MD  losartan (COZAAR) 100 MG tablet Take 1 tablet (100 mg total) by mouth daily. 01/03/19   [name], [name], MD  magnesium oxide (MAG-OX) 400 MG tablet Take 800 mg by mouth daily.  01/11/17   [provider]  metFORMIN (GLUCOPHAGE-XR) 500 MG 24 hr tablet TAKE 1 TABLET(500 MG) BY MOUTH DAILY WITH BREAKFAST Patient taking differently: Take 500 mg by mouth daily with breakfast.  06/05/19   [name], [name], MD  mirtazapine (REMERON) 30 MG tablet Take 1 tablet (30 mg total) by mouth at bedtime. 07/28/15   Pieter Partridge, DO  morphine (MSIR) 15 MG tablet Take 0.5 tablets (7.5 mg total) by mouth every 6 (six) hours as needed for severe pain. 08/21/20   Davonna Belling, MD  mycophenolate (CELLCEPT) 250 MG capsule Take 250 mg by mouth 2 (two) times daily.  07/13/17   [provider]  NONFORMULARY OR COMPOUNDED ITEM [facility]:  Pain Cream - Ketamine 5%, Baclofen 2%, Gabapentin 5%, Lidocaine 5%, Menthol 1%, apply 1-2 grams to affected area 3-4 times a day for pain. 12/18/19   Bronson Ing, DPM  nystatin (MYCOSTATIN) powder Apply 1 g topically 4 (four) times daily as needed. For yeast under breast    [provider]  ondansetron (ZOFRAN ODT) 4 MG disintegrating tablet 95m ODT q4 hours prn nausea/vomiting Patient taking differently: Take 4 mg by mouth every 4 (four) hours as needed for nausea or vomiting.   06/15/15   Mesner, JCorene Cornea MD  [facility] CenterDELICA LANCETS 350YMISC Use to check blood sugar 1 time per day 02/12/15   ERenato Shin MD  Potassium Chloride ER 20 MEQ TBCR Take 1 tablet by mouth daily. NEEDS POTASSIUM RE-CHECK. 06/27/19   JMartinique Betty  G, MD  predniSONE (DELTASONE) 5 MG tablet Take 5 mg by mouth daily with breakfast.  07/13/17   [provider]  QUEtiapine (SEROQUEL) 25 MG tablet Take 1 tablet (25 mg total) by mouth at bedtime. 07/28/15   Pieter Partridge, DO  sertraline (ZOLOFT) 100 MG tablet Take 0.5 tablets (50 mg total) by mouth daily. Patient taking differently: Take 50 mg by mouth daily as needed (anxiety).  01/25/17   Renato Shin, MD  sucralfate (CARAFATE) 1 g tablet Take 1 tablet (1 g total) by mouth 4 (four) times daily for 30 days. 08/22/18 09/11/19  Pokhrel, Corrie Mckusick, MD  tacrolimus (PROGRAF) 1 MG capsule Take 1-2 mg by mouth See admin instructions. 2 mg in the am and 1 mg at night 01/15/18   [provider]  warfarin (COUMADIN) 5 MG tablet TAKE 1/2 TO 1 TABLET BY MOUTH ON [place]; [place], [place], [place], [employer] SUNDAY-2.5MG Patient taking differently: Take 4 mg by mouth daily at 6 PM.  03/27/19   [name], [name], MD    Allergies    Oxycodone-acetaminophen, Propoxyphene, Propoxyphene n-acetaminophen, Sulfonamide derivatives, Codeine, Hydrocodone-acetaminophen, Hydromorphone, Other, Oxycodone, Sulfamethoxazole, Tape, Gabapentin, Latex, Metoprolol, Morphine and related, and Rosiglitazone  Review of Systems   Review of Systems  Constitutional: Negative for chills and fever.  Respiratory: Positive for shortness of breath.   Cardiovascular: Positive for chest pain and leg swelling.  Gastrointestinal: Positive for abdominal pain. Negative for constipation, diarrhea, nausea and vomiting.  Genitourinary: Negative for dysuria.  Musculoskeletal: Negative for back pain.  Skin: Negative for rash and wound.  Allergic/Immunologic: Positive for immunocompromised  state.  Neurological: Negative for weakness.  Hematological: Bruises/bleeds easily.  Psychiatric/Behavioral: Negative for confusion.  All other systems reviewed and are negative.   Physical Exam Updated Vital Signs BP (!) 157/94   Pulse (!) 109   Temp 98.4 F (36.9 C) (Rectal)   Resp 10   SpO2 100%   Physical Exam Vitals and nursing note reviewed.  Constitutional:      General: She is not in acute distress.    Appearance: She is well-developed. She is obese. She is not diaphoretic.     Comments: Appears uncomfortable  HENT:     Head: Normocephalic and atraumatic.     Mouth/Throat:     Mouth: Mucous membranes are moist.  Cardiovascular:     Rate and Rhythm: Regular rhythm. Tachycardia present.     Pulses: Normal pulses.     Heart sounds: Normal heart sounds.  Pulmonary:     Effort: Pulmonary effort is normal. No respiratory distress.     Breath sounds: Normal breath sounds.  Abdominal:     Tenderness: There is abdominal tenderness in the left upper quadrant and left lower quadrant. There is guarding.  Musculoskeletal:     Right lower leg: Edema present.     Left lower leg: Edema present.  Skin:    General: Skin is warm and dry.     Findings: No erythema or rash.  Neurological:     Mental Status: She is alert and oriented to person, place, and time.  Psychiatric:        Behavior: Behavior normal.     ED Results / Procedures / Treatments   Labs (all labs ordered are listed, but only abnormal results are displayed) Labs Reviewed  RESP PANEL BY RT-PCR (FLU A&B, COVID) ARPGX2 - Abnormal; Notable for the following components:      Result Value   SARS Coronavirus 2 by RT  PCR POSITIVE (*)    All other components within normal limits  CBC WITH DIFFERENTIAL/PLATELET - Abnormal; Notable for the following components:   WBC 12.0 (*)    All other components within normal limits  COMPREHENSIVE METABOLIC PANEL - Abnormal; Notable for the following components:   Potassium  3.2 (*)    Glucose, Bld 178 (*)    Creatinine, Ser 0.37 (*)    Calcium 8.8 (*)    Albumin 3.3 (*)    All other components within normal limits  URINALYSIS, ROUTINE W REFLEX MICROSCOPIC - Abnormal; Notable for the following components:   APPearance CLOUDY (*)    All other components within normal limits  PROTIME-INR - Abnormal; Notable for the following components:   Prothrombin Time 25.6 (*)    INR 2.4 (*)    All other components within normal limits  LIPASE, BLOOD  LACTIC ACID, PLASMA    EKG EKG Interpretation  Date/Time:  Thursday August 28 2020 10:55:20 EDT Ventricular Rate:  102 PR Interval:    QRS Duration: 108 QT Interval:  382 QTC Calculation: 498 R Axis:   -50 Text Interpretation: Sinus tachycardia LAD, consider left anterior fascicular block Abnormal R-wave progression, late transition LVH with secondary repolarization abnormality Borderline prolonged QT interval since last tracing no significant change Confirmed by Daleen Bo [phone]) on 08/28/2020 2:16:14 PM   Radiology CT Abdomen Pelvis Wo Contrast  Result Date: 08/28/2020 CLINICAL DATA:  Chest pain and shortness of breath with left-sided abdominal pain for several days EXAM: CT CHEST, ABDOMEN AND PELVIS WITHOUT CONTRAST TECHNIQUE: Multidetector CT imaging of the chest, abdomen and pelvis was performed following the standard protocol without IV contrast. COMPARISON:: COMPARISON: Chest x-ray from earlier in the same day, CT from 08/21/2020 FINDINGS: CT CHEST FINDINGS Cardiovascular: Somewhat limited due to lack of IV contrast. Aortic calcifications are noted without aneurysmal dilatation. No cardiac enlargement is seen. Coronary calcifications are noted. Pulmonary artery as visualized is within normal limits. Mediastinum/Nodes: Thoracic inlet is unremarkable. No sizable hilar or mediastinal adenopathy is noted. The esophagus is within normal limits. Lungs/Pleura: Bilateral lower lobe consolidation increased when compared  with the prior CT from 1 week previous. Small left pleural effusion is noted. No parenchymal nodules are seen. Musculoskeletal: Degenerative changes of the thoracic spine are noted. No acute rib abnormality is seen. Old rib fractures are noted on the left involving the seventh through ninth ribs. CT ABDOMEN PELVIS FINDINGS Hepatobiliary: No focal liver abnormality is seen. Status post cholecystectomy. No biliary dilatation. Pancreas: Unremarkable. No pancreatic ductal dilatation or surrounding inflammatory changes. Spleen: Normal in size without focal abnormality. Adrenals/Urinary Tract: Adrenal glands are within normal limits. The kidneys are shrunken similar to that seen on prior CT examination consistent with end-stage renal disease. Transplant kidney is noted in the right lower quadrant without evidence of hydronephrosis. The bladder is partially distended. Stomach/Bowel: Scattered diverticular changes noted without evidence of diverticulitis. No obstructive changes are seen. The appendix is within normal limits. Small bowel and stomach appear within normal limits with the exception of a small sliding-type hiatal hernia. Vascular/Lymphatic: Aortic atherosclerosis. No enlarged abdominal or pelvic lymph nodes. Reproductive: Uterus and bilateral adnexa are unremarkable. Other: No abdominal wall hernia or abnormality. No abdominopelvic ascites. Musculoskeletal: Generalized osteopenia is noted. No acute bony abnormality is seen. IMPRESSION: Diverticulosis without diverticulitis. New bilateral lower lobe consolidation with left-sided effusion. No other acute abnormality is noted. Electronically Signed   By: Inez Catalina M.D.   On: 08/28/2020 15:49   CT Chest  Wo Contrast  Result Date: 08/28/2020 CLINICAL DATA:  Chest pain and shortness of breath with left-sided abdominal pain for several days EXAM: CT CHEST, ABDOMEN AND PELVIS WITHOUT CONTRAST TECHNIQUE: Multidetector CT imaging of the chest, abdomen and pelvis was  performed following the standard protocol without IV contrast. COMPARISON:: COMPARISON: Chest x-ray from earlier in the same day, CT from 08/21/2020 FINDINGS: CT CHEST FINDINGS Cardiovascular: Somewhat limited due to lack of IV contrast. Aortic calcifications are noted without aneurysmal dilatation. No cardiac enlargement is seen. Coronary calcifications are noted. Pulmonary artery as visualized is within normal limits. Mediastinum/Nodes: Thoracic inlet is unremarkable. No sizable hilar or mediastinal adenopathy is noted. The esophagus is within normal limits. Lungs/Pleura: Bilateral lower lobe consolidation increased when compared with the prior CT from 1 week previous. Small left pleural effusion is noted. No parenchymal nodules are seen. Musculoskeletal: Degenerative changes of the thoracic spine are noted. No acute rib abnormality is seen. Old rib fractures are noted on the left involving the seventh through ninth ribs. CT ABDOMEN PELVIS FINDINGS Hepatobiliary: No focal liver abnormality is seen. Status post cholecystectomy. No biliary dilatation. Pancreas: Unremarkable. No pancreatic ductal dilatation or surrounding inflammatory changes. Spleen: Normal in size without focal abnormality. Adrenals/Urinary Tract: Adrenal glands are within normal limits. The kidneys are shrunken similar to that seen on prior CT examination consistent with end-stage renal disease. Transplant kidney is noted in the right lower quadrant without evidence of hydronephrosis. The bladder is partially distended. Stomach/Bowel: Scattered diverticular changes noted without evidence of diverticulitis. No obstructive changes are seen. The appendix is within normal limits. Small bowel and stomach appear within normal limits with the exception of a small sliding-type hiatal hernia. Vascular/Lymphatic: Aortic atherosclerosis. No enlarged abdominal or pelvic lymph nodes. Reproductive: Uterus and bilateral adnexa are unremarkable. Other: No  abdominal wall hernia or abnormality. No abdominopelvic ascites. Musculoskeletal: Generalized osteopenia is noted. No acute bony abnormality is seen. IMPRESSION: Diverticulosis without diverticulitis. New bilateral lower lobe consolidation with left-sided effusion. No other acute abnormality is noted. Electronically Signed   By: Inez Catalina M.D.   On: 08/28/2020 15:49   DG Chest Port 1 View  Result Date: 08/28/2020 CLINICAL DATA:  Shortness of breath. EXAM: PORTABLE CHEST 1 VIEW COMPARISON:  08/21/2020 and 09/10/2019 FINDINGS: Again noted is elevation of the right hemidiaphragm. Enlarged central vascular structures appear to be chronic. Stable appearance of the heart and mediastinum. Atherosclerotic calcifications at the aortic arch. There is volume loss particularly in the right lung compared to 08/21/2020. IMPRESSION: 1. Volume loss compared to the exam on 08/21/2020. No significant airspace disease or lung consolidation. 2. Chronic enlargement of the central vascular structures. 3. Chronic elevation of the right hemidiaphragm. Electronically Signed   By: Markus Daft M.D.   On: 08/28/2020 11:34    Procedures Procedures   Medications Ordered in ED Medications  levofloxacin (LEVAQUIN) tablet 750 mg (has no administration in time range)  fentaNYL (SUBLIMAZE) injection 50 mcg (50 mcg Intravenous Given 08/28/20 1125)  fentaNYL (SUBLIMAZE) injection 100 mcg (100 mcg Intravenous Given 08/28/20 1303)  ondansetron (ZOFRAN) injection 4 mg (4 mg Intravenous Given 08/28/20 1302)  iohexol (OMNIPAQUE) 9 MG/ML oral solution 500 mL (500 mLs Oral Contrast Given 08/28/20 1446)  iohexol (OMNIPAQUE) 9 MG/ML oral solution (  Contrast Given 08/28/20 1409)    ED Course  I have reviewed the triage vital signs and the nursing notes.  Pertinent labs & imaging results that were available during my care of the patient were reviewed by me  and considered in my medical decision making (see chart for details).  Clinical  Course as of 08/28/20 1647  Thu Aug 29, 2910  2155 60 year old female with complaint of left side abdominal/chest pain x 10 days as above.  Guarding on exam. No vomiting or changes in bowel or bladder habits.  Labs reviewed with mild hypokalemia with K 3.2, will give oral dose. Otherwise unremarkable CMP. UA without evidence of infection. INR therapeutic at 2.4. Lactic acid 1.9. Lipase WNL. CBC with WBC 12.0, differential WNL.  Labs compared to prior ER work up without significant changes.  CXR unremarkable.  Given Fentynal for pain (allergies limit pain medication selection).  Discussed with patient's daughter who requests transfer to [facility] due to inability to find source of patient's pain today. Discussed inability to transfer without diagnosis requiring evaluation at [facility]. Discussed with Dr. Eulis Foster who has seen the patient, recommends CT chest/abdomen/pelvis. If no diagnosis after imaging, admit for intractable pain.  [LM]  51 CT chest/abdomen/pelvis with concern for pneumonia. Patient is COVID positive.  Discussed with Dr. Eulis Foster, ER attending, plan is to treat with Levaquin. Does not require supplemental oxygen, can be discharged to her care facility for further treatment and monitoring.  First dose of antibiotic given in the ER.  Recommend close monitoring of her INR while on antibiotics. [LM]  1613 ERION HERMANS was evaluated in Emergency Department on 08/28/2020 for the symptoms described in the history of present illness. She was evaluated in the context of the global COVID-19 pandemic, which necessitated consideration that the patient might be at risk for infection with the SARS-CoV-2 virus that causes COVID-19. Institutional protocols and algorithms that pertain to the evaluation of patients at risk for COVID-19 are in a state of rapid change based on information released by regulatory bodies including the CDC and federal and state organizations. These policies and algorithms were  followed during the patient's care in the ED.  [LM]  [employer] back to patient's room, patient is refusing oral antibiotics, insists on IV antibiotics. Discussed with patient while she can get an IV dose here, she will need to have her IV removed for discharge and will need to take antibiotics by mouth at her facility. Patient is agreeable with this, states she will feel better tomorrow. Patient has a prescription for Morphine at the facility to take for her pain. I had attempted to speak with the patient's daughter earlier about discharge planning however as soon as the patient told her daughter she had COVID the daughter hung up to call the facility to complain about the patient getting COVID. Call back to patient's daughter with patient's phone on speaker, discussed COVID +, plan to treat with antibiotics, facility can monitor and return to ER if needed. Daughter verbalizes understanding, no further questions.  [LM]    Clinical Course User Index [LM] Roque Lias   MDM Rules/Calculators/A&P                          Final Clinical Impression(s) / ED Diagnoses Final diagnoses:  COVID  Community acquired pneumonia of left lower lobe of lung    Rx / DC Orders ED Discharge Orders         Ordered    levofloxacin (LEVAQUIN) 500 MG tablet  Daily        08/28/20 1610           Tacy Learn, PA-C 08/28/20 1614  Tacy Learn, PA-C 08/28/20 1647    Daleen Bo, MD 08/30/20 [phone]

## 2020-08-28 NOTE — ED Notes (Addendum)
Hi there. I just wanted to give y0u a head ups. Patient's daughter is on phone yelling. I told her I couldn't give any discharge information that she could have speak with PA that she previously spoke too. Patient is refusing to take PO Levaquin. Daughter asked if I was white. ( Leon RN)_ Patient's daughter is on phone yelling. I told her I couldn't give any discharge information that she could have speak with PA that she previously spoke too. Patient is refusing to take PO Levaquin. Daughter asked if I was white.  Patient's daughter is on phone yelling. I told her I couldn't give any discharge information that she could have speak with PA that she previously spoke too. Patient is refusing to take PO Levaquin. Daughter asked if I was white. (Sent to Suella Broad PA)  No the daughter was asking if I was white or black. Patient's daughter is out in lobby wanting to speak with Agricultural consultant. PA is in room speaking with patient. Patient will not take her PO Levaquin  I am just following what I was told which is not to let visitors back if patient is Covid positive. Also the PA had previously talked to her on the phone regarding patient's discharge.

## 2020-08-29 DIAGNOSIS — U071 COVID-19: Secondary | ICD-10-CM | POA: Diagnosis not present

## 2020-09-03 DIAGNOSIS — U071 COVID-19: Secondary | ICD-10-CM | POA: Diagnosis not present

## 2020-09-03 DIAGNOSIS — Z940 Kidney transplant status: Secondary | ICD-10-CM | POA: Diagnosis not present

## 2020-09-03 DIAGNOSIS — R079 Chest pain, unspecified: Secondary | ICD-10-CM | POA: Diagnosis not present

## 2020-09-09 DIAGNOSIS — Z7901 Long term (current) use of anticoagulants: Secondary | ICD-10-CM | POA: Diagnosis not present

## 2020-09-09 DIAGNOSIS — Z940 Kidney transplant status: Secondary | ICD-10-CM | POA: Diagnosis not present

## 2020-09-11 DIAGNOSIS — E559 Vitamin D deficiency, unspecified: Secondary | ICD-10-CM | POA: Diagnosis not present

## 2020-09-11 DIAGNOSIS — G8929 Other chronic pain: Secondary | ICD-10-CM | POA: Diagnosis not present

## 2020-09-11 DIAGNOSIS — D6861 Antiphospholipid syndrome: Secondary | ICD-10-CM | POA: Diagnosis not present

## 2020-09-11 DIAGNOSIS — R7989 Other specified abnormal findings of blood chemistry: Secondary | ICD-10-CM | POA: Diagnosis not present

## 2020-09-23 DIAGNOSIS — I69353 Hemiplegia and hemiparesis following cerebral infarction affecting right non-dominant side: Secondary | ICD-10-CM | POA: Diagnosis not present

## 2020-09-23 DIAGNOSIS — M4680 Other specified inflammatory spondylopathies, site unspecified: Secondary | ICD-10-CM | POA: Diagnosis not present

## 2020-09-23 DIAGNOSIS — I110 Hypertensive heart disease with heart failure: Secondary | ICD-10-CM | POA: Diagnosis not present

## 2020-10-10 DIAGNOSIS — G8929 Other chronic pain: Secondary | ICD-10-CM | POA: Diagnosis not present

## 2020-10-10 DIAGNOSIS — E559 Vitamin D deficiency, unspecified: Secondary | ICD-10-CM | POA: Diagnosis not present

## 2020-10-10 DIAGNOSIS — R7989 Other specified abnormal findings of blood chemistry: Secondary | ICD-10-CM | POA: Diagnosis not present

## 2020-10-10 DIAGNOSIS — D6861 Antiphospholipid syndrome: Secondary | ICD-10-CM | POA: Diagnosis not present

## 2020-10-13 DIAGNOSIS — D6861 Antiphospholipid syndrome: Secondary | ICD-10-CM | POA: Diagnosis not present

## 2020-10-29 DIAGNOSIS — N186 End stage renal disease: Secondary | ICD-10-CM | POA: Diagnosis not present

## 2020-10-29 DIAGNOSIS — R278 Other lack of coordination: Secondary | ICD-10-CM | POA: Diagnosis not present

## 2020-10-29 DIAGNOSIS — R2689 Other abnormalities of gait and mobility: Secondary | ICD-10-CM | POA: Diagnosis not present

## 2020-10-29 DIAGNOSIS — R2681 Unsteadiness on feet: Secondary | ICD-10-CM | POA: Diagnosis not present

## 2020-10-29 DIAGNOSIS — M6281 Muscle weakness (generalized): Secondary | ICD-10-CM | POA: Diagnosis not present

## 2020-10-29 DIAGNOSIS — E119 Type 2 diabetes mellitus without complications: Secondary | ICD-10-CM | POA: Diagnosis not present

## 2020-10-29 DIAGNOSIS — M6259 Muscle wasting and atrophy, not elsewhere classified, multiple sites: Secondary | ICD-10-CM | POA: Diagnosis not present

## 2020-10-29 DIAGNOSIS — R262 Difficulty in walking, not elsewhere classified: Secondary | ICD-10-CM | POA: Diagnosis not present

## 2020-10-29 DIAGNOSIS — I69353 Hemiplegia and hemiparesis following cerebral infarction affecting right non-dominant side: Secondary | ICD-10-CM | POA: Diagnosis not present

## 2020-10-29 DIAGNOSIS — I10 Essential (primary) hypertension: Secondary | ICD-10-CM | POA: Diagnosis not present

## 2020-10-29 DIAGNOSIS — R4781 Slurred speech: Secondary | ICD-10-CM | POA: Diagnosis not present

## 2020-10-30 DIAGNOSIS — I69353 Hemiplegia and hemiparesis following cerebral infarction affecting right non-dominant side: Secondary | ICD-10-CM | POA: Diagnosis not present

## 2020-10-30 DIAGNOSIS — D6861 Antiphospholipid syndrome: Secondary | ICD-10-CM | POA: Diagnosis not present

## 2020-10-30 DIAGNOSIS — I10 Essential (primary) hypertension: Secondary | ICD-10-CM | POA: Diagnosis not present

## 2020-10-30 DIAGNOSIS — N186 End stage renal disease: Secondary | ICD-10-CM | POA: Diagnosis not present

## 2020-10-30 DIAGNOSIS — M6281 Muscle weakness (generalized): Secondary | ICD-10-CM | POA: Diagnosis not present

## 2020-10-30 DIAGNOSIS — E119 Type 2 diabetes mellitus without complications: Secondary | ICD-10-CM | POA: Diagnosis not present

## 2020-10-30 DIAGNOSIS — R278 Other lack of coordination: Secondary | ICD-10-CM | POA: Diagnosis not present

## 2020-10-30 DIAGNOSIS — R262 Difficulty in walking, not elsewhere classified: Secondary | ICD-10-CM | POA: Diagnosis not present

## 2020-10-30 DIAGNOSIS — M6259 Muscle wasting and atrophy, not elsewhere classified, multiple sites: Secondary | ICD-10-CM | POA: Diagnosis not present

## 2020-10-30 DIAGNOSIS — I110 Hypertensive heart disease with heart failure: Secondary | ICD-10-CM | POA: Diagnosis not present

## 2020-10-30 DIAGNOSIS — R4781 Slurred speech: Secondary | ICD-10-CM | POA: Diagnosis not present

## 2020-10-30 DIAGNOSIS — R2681 Unsteadiness on feet: Secondary | ICD-10-CM | POA: Diagnosis not present

## 2020-10-30 DIAGNOSIS — R2689 Other abnormalities of gait and mobility: Secondary | ICD-10-CM | POA: Diagnosis not present

## 2020-10-31 DIAGNOSIS — R278 Other lack of coordination: Secondary | ICD-10-CM | POA: Diagnosis not present

## 2020-10-31 DIAGNOSIS — R2681 Unsteadiness on feet: Secondary | ICD-10-CM | POA: Diagnosis not present

## 2020-10-31 DIAGNOSIS — M6259 Muscle wasting and atrophy, not elsewhere classified, multiple sites: Secondary | ICD-10-CM | POA: Diagnosis not present

## 2020-10-31 DIAGNOSIS — R4781 Slurred speech: Secondary | ICD-10-CM | POA: Diagnosis not present

## 2020-10-31 DIAGNOSIS — E119 Type 2 diabetes mellitus without complications: Secondary | ICD-10-CM | POA: Diagnosis not present

## 2020-10-31 DIAGNOSIS — I10 Essential (primary) hypertension: Secondary | ICD-10-CM | POA: Diagnosis not present

## 2020-10-31 DIAGNOSIS — M6281 Muscle weakness (generalized): Secondary | ICD-10-CM | POA: Diagnosis not present

## 2020-10-31 DIAGNOSIS — R262 Difficulty in walking, not elsewhere classified: Secondary | ICD-10-CM | POA: Diagnosis not present

## 2020-10-31 DIAGNOSIS — I69353 Hemiplegia and hemiparesis following cerebral infarction affecting right non-dominant side: Secondary | ICD-10-CM | POA: Diagnosis not present

## 2020-10-31 DIAGNOSIS — R2689 Other abnormalities of gait and mobility: Secondary | ICD-10-CM | POA: Diagnosis not present

## 2020-10-31 DIAGNOSIS — N186 End stage renal disease: Secondary | ICD-10-CM | POA: Diagnosis not present

## 2020-11-05 DIAGNOSIS — E119 Type 2 diabetes mellitus without complications: Secondary | ICD-10-CM | POA: Diagnosis not present

## 2020-11-05 DIAGNOSIS — R4781 Slurred speech: Secondary | ICD-10-CM | POA: Diagnosis not present

## 2020-11-05 DIAGNOSIS — M6281 Muscle weakness (generalized): Secondary | ICD-10-CM | POA: Diagnosis not present

## 2020-11-05 DIAGNOSIS — R2689 Other abnormalities of gait and mobility: Secondary | ICD-10-CM | POA: Diagnosis not present

## 2020-11-05 DIAGNOSIS — R2681 Unsteadiness on feet: Secondary | ICD-10-CM | POA: Diagnosis not present

## 2020-11-05 DIAGNOSIS — N186 End stage renal disease: Secondary | ICD-10-CM | POA: Diagnosis not present

## 2020-11-05 DIAGNOSIS — G894 Chronic pain syndrome: Secondary | ICD-10-CM | POA: Diagnosis not present

## 2020-11-05 DIAGNOSIS — I69353 Hemiplegia and hemiparesis following cerebral infarction affecting right non-dominant side: Secondary | ICD-10-CM | POA: Diagnosis not present

## 2020-11-05 DIAGNOSIS — M6259 Muscle wasting and atrophy, not elsewhere classified, multiple sites: Secondary | ICD-10-CM | POA: Diagnosis not present

## 2020-11-05 DIAGNOSIS — I6330 Cerebral infarction due to thrombosis of unspecified cerebral artery: Secondary | ICD-10-CM | POA: Diagnosis not present

## 2020-11-05 DIAGNOSIS — K5901 Slow transit constipation: Secondary | ICD-10-CM | POA: Diagnosis not present

## 2020-11-05 DIAGNOSIS — M150 Primary generalized (osteo)arthritis: Secondary | ICD-10-CM | POA: Diagnosis not present

## 2020-11-05 DIAGNOSIS — R1312 Dysphagia, oropharyngeal phase: Secondary | ICD-10-CM | POA: Diagnosis not present

## 2020-11-05 DIAGNOSIS — R278 Other lack of coordination: Secondary | ICD-10-CM | POA: Diagnosis not present

## 2020-11-05 DIAGNOSIS — J1282 Pneumonia due to coronavirus disease 2019: Secondary | ICD-10-CM | POA: Diagnosis not present

## 2020-11-05 DIAGNOSIS — R262 Difficulty in walking, not elsewhere classified: Secondary | ICD-10-CM | POA: Diagnosis not present

## 2020-11-05 DIAGNOSIS — M328 Other forms of systemic lupus erythematosus: Secondary | ICD-10-CM | POA: Diagnosis not present

## 2020-11-05 DIAGNOSIS — T8619 Other complication of kidney transplant: Secondary | ICD-10-CM | POA: Diagnosis not present

## 2020-11-05 DIAGNOSIS — I10 Essential (primary) hypertension: Secondary | ICD-10-CM | POA: Diagnosis not present

## 2020-11-05 DIAGNOSIS — I5032 Chronic diastolic (congestive) heart failure: Secondary | ICD-10-CM | POA: Diagnosis not present

## 2020-11-05 DIAGNOSIS — E118 Type 2 diabetes mellitus with unspecified complications: Secondary | ICD-10-CM | POA: Diagnosis not present

## 2020-11-06 DIAGNOSIS — I69353 Hemiplegia and hemiparesis following cerebral infarction affecting right non-dominant side: Secondary | ICD-10-CM | POA: Diagnosis not present

## 2020-11-06 DIAGNOSIS — N186 End stage renal disease: Secondary | ICD-10-CM | POA: Diagnosis not present

## 2020-11-06 DIAGNOSIS — R2681 Unsteadiness on feet: Secondary | ICD-10-CM | POA: Diagnosis not present

## 2020-11-06 DIAGNOSIS — R262 Difficulty in walking, not elsewhere classified: Secondary | ICD-10-CM | POA: Diagnosis not present

## 2020-11-06 DIAGNOSIS — M6259 Muscle wasting and atrophy, not elsewhere classified, multiple sites: Secondary | ICD-10-CM | POA: Diagnosis not present

## 2020-11-06 DIAGNOSIS — R4781 Slurred speech: Secondary | ICD-10-CM | POA: Diagnosis not present

## 2020-11-06 DIAGNOSIS — J1282 Pneumonia due to coronavirus disease 2019: Secondary | ICD-10-CM | POA: Diagnosis not present

## 2020-11-06 DIAGNOSIS — I10 Essential (primary) hypertension: Secondary | ICD-10-CM | POA: Diagnosis not present

## 2020-11-06 DIAGNOSIS — R1312 Dysphagia, oropharyngeal phase: Secondary | ICD-10-CM | POA: Diagnosis not present

## 2020-11-06 DIAGNOSIS — E119 Type 2 diabetes mellitus without complications: Secondary | ICD-10-CM | POA: Diagnosis not present

## 2020-11-06 DIAGNOSIS — R278 Other lack of coordination: Secondary | ICD-10-CM | POA: Diagnosis not present

## 2020-11-06 DIAGNOSIS — R2689 Other abnormalities of gait and mobility: Secondary | ICD-10-CM | POA: Diagnosis not present

## 2020-11-06 DIAGNOSIS — M6281 Muscle weakness (generalized): Secondary | ICD-10-CM | POA: Diagnosis not present

## 2020-11-08 DIAGNOSIS — I10 Essential (primary) hypertension: Secondary | ICD-10-CM | POA: Diagnosis not present

## 2020-11-08 DIAGNOSIS — M6259 Muscle wasting and atrophy, not elsewhere classified, multiple sites: Secondary | ICD-10-CM | POA: Diagnosis not present

## 2020-11-08 DIAGNOSIS — N186 End stage renal disease: Secondary | ICD-10-CM | POA: Diagnosis not present

## 2020-11-08 DIAGNOSIS — R2681 Unsteadiness on feet: Secondary | ICD-10-CM | POA: Diagnosis not present

## 2020-11-08 DIAGNOSIS — R1312 Dysphagia, oropharyngeal phase: Secondary | ICD-10-CM | POA: Diagnosis not present

## 2020-11-08 DIAGNOSIS — M6281 Muscle weakness (generalized): Secondary | ICD-10-CM | POA: Diagnosis not present

## 2020-11-08 DIAGNOSIS — J1282 Pneumonia due to coronavirus disease 2019: Secondary | ICD-10-CM | POA: Diagnosis not present

## 2020-11-08 DIAGNOSIS — R262 Difficulty in walking, not elsewhere classified: Secondary | ICD-10-CM | POA: Diagnosis not present

## 2020-11-08 DIAGNOSIS — R278 Other lack of coordination: Secondary | ICD-10-CM | POA: Diagnosis not present

## 2020-11-08 DIAGNOSIS — R2689 Other abnormalities of gait and mobility: Secondary | ICD-10-CM | POA: Diagnosis not present

## 2020-11-08 DIAGNOSIS — I69353 Hemiplegia and hemiparesis following cerebral infarction affecting right non-dominant side: Secondary | ICD-10-CM | POA: Diagnosis not present

## 2020-11-08 DIAGNOSIS — E119 Type 2 diabetes mellitus without complications: Secondary | ICD-10-CM | POA: Diagnosis not present

## 2020-11-08 DIAGNOSIS — R4781 Slurred speech: Secondary | ICD-10-CM | POA: Diagnosis not present

## 2020-11-09 DIAGNOSIS — N186 End stage renal disease: Secondary | ICD-10-CM | POA: Diagnosis not present

## 2020-11-09 DIAGNOSIS — J1282 Pneumonia due to coronavirus disease 2019: Secondary | ICD-10-CM | POA: Diagnosis not present

## 2020-11-09 DIAGNOSIS — R278 Other lack of coordination: Secondary | ICD-10-CM | POA: Diagnosis not present

## 2020-11-09 DIAGNOSIS — R1312 Dysphagia, oropharyngeal phase: Secondary | ICD-10-CM | POA: Diagnosis not present

## 2020-11-09 DIAGNOSIS — M6259 Muscle wasting and atrophy, not elsewhere classified, multiple sites: Secondary | ICD-10-CM | POA: Diagnosis not present

## 2020-11-09 DIAGNOSIS — E119 Type 2 diabetes mellitus without complications: Secondary | ICD-10-CM | POA: Diagnosis not present

## 2020-11-09 DIAGNOSIS — M6281 Muscle weakness (generalized): Secondary | ICD-10-CM | POA: Diagnosis not present

## 2020-11-09 DIAGNOSIS — I69353 Hemiplegia and hemiparesis following cerebral infarction affecting right non-dominant side: Secondary | ICD-10-CM | POA: Diagnosis not present

## 2020-11-09 DIAGNOSIS — R2681 Unsteadiness on feet: Secondary | ICD-10-CM | POA: Diagnosis not present

## 2020-11-09 DIAGNOSIS — R2689 Other abnormalities of gait and mobility: Secondary | ICD-10-CM | POA: Diagnosis not present

## 2020-11-09 DIAGNOSIS — R262 Difficulty in walking, not elsewhere classified: Secondary | ICD-10-CM | POA: Diagnosis not present

## 2020-11-09 DIAGNOSIS — I10 Essential (primary) hypertension: Secondary | ICD-10-CM | POA: Diagnosis not present

## 2020-11-09 DIAGNOSIS — R4781 Slurred speech: Secondary | ICD-10-CM | POA: Diagnosis not present

## 2020-11-10 DIAGNOSIS — M6281 Muscle weakness (generalized): Secondary | ICD-10-CM | POA: Diagnosis not present

## 2020-11-10 DIAGNOSIS — E119 Type 2 diabetes mellitus without complications: Secondary | ICD-10-CM | POA: Diagnosis not present

## 2020-11-10 DIAGNOSIS — R278 Other lack of coordination: Secondary | ICD-10-CM | POA: Diagnosis not present

## 2020-11-10 DIAGNOSIS — M6259 Muscle wasting and atrophy, not elsewhere classified, multiple sites: Secondary | ICD-10-CM | POA: Diagnosis not present

## 2020-11-10 DIAGNOSIS — R4781 Slurred speech: Secondary | ICD-10-CM | POA: Diagnosis not present

## 2020-11-10 DIAGNOSIS — Z940 Kidney transplant status: Secondary | ICD-10-CM | POA: Diagnosis not present

## 2020-11-10 DIAGNOSIS — R2681 Unsteadiness on feet: Secondary | ICD-10-CM | POA: Diagnosis not present

## 2020-11-10 DIAGNOSIS — G894 Chronic pain syndrome: Secondary | ICD-10-CM | POA: Diagnosis not present

## 2020-11-10 DIAGNOSIS — N186 End stage renal disease: Secondary | ICD-10-CM | POA: Diagnosis not present

## 2020-11-10 DIAGNOSIS — R262 Difficulty in walking, not elsewhere classified: Secondary | ICD-10-CM | POA: Diagnosis not present

## 2020-11-10 DIAGNOSIS — I69353 Hemiplegia and hemiparesis following cerebral infarction affecting right non-dominant side: Secondary | ICD-10-CM | POA: Diagnosis not present

## 2020-11-10 DIAGNOSIS — I10 Essential (primary) hypertension: Secondary | ICD-10-CM | POA: Diagnosis not present

## 2020-11-10 DIAGNOSIS — J1282 Pneumonia due to coronavirus disease 2019: Secondary | ICD-10-CM | POA: Diagnosis not present

## 2020-11-10 DIAGNOSIS — R2689 Other abnormalities of gait and mobility: Secondary | ICD-10-CM | POA: Diagnosis not present

## 2020-11-10 DIAGNOSIS — R1312 Dysphagia, oropharyngeal phase: Secondary | ICD-10-CM | POA: Diagnosis not present

## 2020-11-11 DIAGNOSIS — I10 Essential (primary) hypertension: Secondary | ICD-10-CM | POA: Diagnosis not present

## 2020-11-11 DIAGNOSIS — R4781 Slurred speech: Secondary | ICD-10-CM | POA: Diagnosis not present

## 2020-11-11 DIAGNOSIS — M6259 Muscle wasting and atrophy, not elsewhere classified, multiple sites: Secondary | ICD-10-CM | POA: Diagnosis not present

## 2020-11-11 DIAGNOSIS — R2681 Unsteadiness on feet: Secondary | ICD-10-CM | POA: Diagnosis not present

## 2020-11-11 DIAGNOSIS — M6281 Muscle weakness (generalized): Secondary | ICD-10-CM | POA: Diagnosis not present

## 2020-11-11 DIAGNOSIS — E119 Type 2 diabetes mellitus without complications: Secondary | ICD-10-CM | POA: Diagnosis not present

## 2020-11-11 DIAGNOSIS — R1312 Dysphagia, oropharyngeal phase: Secondary | ICD-10-CM | POA: Diagnosis not present

## 2020-11-11 DIAGNOSIS — R278 Other lack of coordination: Secondary | ICD-10-CM | POA: Diagnosis not present

## 2020-11-11 DIAGNOSIS — J1282 Pneumonia due to coronavirus disease 2019: Secondary | ICD-10-CM | POA: Diagnosis not present

## 2020-11-11 DIAGNOSIS — I69353 Hemiplegia and hemiparesis following cerebral infarction affecting right non-dominant side: Secondary | ICD-10-CM | POA: Diagnosis not present

## 2020-11-11 DIAGNOSIS — R2689 Other abnormalities of gait and mobility: Secondary | ICD-10-CM | POA: Diagnosis not present

## 2020-11-11 DIAGNOSIS — N186 End stage renal disease: Secondary | ICD-10-CM | POA: Diagnosis not present

## 2020-11-11 DIAGNOSIS — R262 Difficulty in walking, not elsewhere classified: Secondary | ICD-10-CM | POA: Diagnosis not present

## 2020-11-12 DIAGNOSIS — J1282 Pneumonia due to coronavirus disease 2019: Secondary | ICD-10-CM | POA: Diagnosis not present

## 2020-11-12 DIAGNOSIS — M6281 Muscle weakness (generalized): Secondary | ICD-10-CM | POA: Diagnosis not present

## 2020-11-12 DIAGNOSIS — E119 Type 2 diabetes mellitus without complications: Secondary | ICD-10-CM | POA: Diagnosis not present

## 2020-11-12 DIAGNOSIS — R262 Difficulty in walking, not elsewhere classified: Secondary | ICD-10-CM | POA: Diagnosis not present

## 2020-11-12 DIAGNOSIS — M6259 Muscle wasting and atrophy, not elsewhere classified, multiple sites: Secondary | ICD-10-CM | POA: Diagnosis not present

## 2020-11-12 DIAGNOSIS — R4781 Slurred speech: Secondary | ICD-10-CM | POA: Diagnosis not present

## 2020-11-12 DIAGNOSIS — I10 Essential (primary) hypertension: Secondary | ICD-10-CM | POA: Diagnosis not present

## 2020-11-12 DIAGNOSIS — R278 Other lack of coordination: Secondary | ICD-10-CM | POA: Diagnosis not present

## 2020-11-12 DIAGNOSIS — R2689 Other abnormalities of gait and mobility: Secondary | ICD-10-CM | POA: Diagnosis not present

## 2020-11-12 DIAGNOSIS — N186 End stage renal disease: Secondary | ICD-10-CM | POA: Diagnosis not present

## 2020-11-12 DIAGNOSIS — I69353 Hemiplegia and hemiparesis following cerebral infarction affecting right non-dominant side: Secondary | ICD-10-CM | POA: Diagnosis not present

## 2020-11-12 DIAGNOSIS — R1312 Dysphagia, oropharyngeal phase: Secondary | ICD-10-CM | POA: Diagnosis not present

## 2020-11-12 DIAGNOSIS — R2681 Unsteadiness on feet: Secondary | ICD-10-CM | POA: Diagnosis not present

## 2020-11-13 DIAGNOSIS — R109 Unspecified abdominal pain: Secondary | ICD-10-CM | POA: Diagnosis not present

## 2020-11-14 DIAGNOSIS — R4781 Slurred speech: Secondary | ICD-10-CM | POA: Diagnosis not present

## 2020-11-14 DIAGNOSIS — M6281 Muscle weakness (generalized): Secondary | ICD-10-CM | POA: Diagnosis not present

## 2020-11-14 DIAGNOSIS — E119 Type 2 diabetes mellitus without complications: Secondary | ICD-10-CM | POA: Diagnosis not present

## 2020-11-14 DIAGNOSIS — Z7901 Long term (current) use of anticoagulants: Secondary | ICD-10-CM | POA: Diagnosis not present

## 2020-11-14 DIAGNOSIS — I69353 Hemiplegia and hemiparesis following cerebral infarction affecting right non-dominant side: Secondary | ICD-10-CM | POA: Diagnosis not present

## 2020-11-14 DIAGNOSIS — R2681 Unsteadiness on feet: Secondary | ICD-10-CM | POA: Diagnosis not present

## 2020-11-14 DIAGNOSIS — R278 Other lack of coordination: Secondary | ICD-10-CM | POA: Diagnosis not present

## 2020-11-14 DIAGNOSIS — K5901 Slow transit constipation: Secondary | ICD-10-CM | POA: Diagnosis not present

## 2020-11-14 DIAGNOSIS — R2689 Other abnormalities of gait and mobility: Secondary | ICD-10-CM | POA: Diagnosis not present

## 2020-11-14 DIAGNOSIS — J1282 Pneumonia due to coronavirus disease 2019: Secondary | ICD-10-CM | POA: Diagnosis not present

## 2020-11-14 DIAGNOSIS — I10 Essential (primary) hypertension: Secondary | ICD-10-CM | POA: Diagnosis not present

## 2020-11-14 DIAGNOSIS — N186 End stage renal disease: Secondary | ICD-10-CM | POA: Diagnosis not present

## 2020-11-14 DIAGNOSIS — R262 Difficulty in walking, not elsewhere classified: Secondary | ICD-10-CM | POA: Diagnosis not present

## 2020-11-14 DIAGNOSIS — R1312 Dysphagia, oropharyngeal phase: Secondary | ICD-10-CM | POA: Diagnosis not present

## 2020-11-14 DIAGNOSIS — M6259 Muscle wasting and atrophy, not elsewhere classified, multiple sites: Secondary | ICD-10-CM | POA: Diagnosis not present

## 2020-11-17 DIAGNOSIS — R1312 Dysphagia, oropharyngeal phase: Secondary | ICD-10-CM | POA: Diagnosis not present

## 2020-11-17 DIAGNOSIS — I69353 Hemiplegia and hemiparesis following cerebral infarction affecting right non-dominant side: Secondary | ICD-10-CM | POA: Diagnosis not present

## 2020-11-17 DIAGNOSIS — M818 Other osteoporosis without current pathological fracture: Secondary | ICD-10-CM | POA: Diagnosis not present

## 2020-11-17 DIAGNOSIS — I5032 Chronic diastolic (congestive) heart failure: Secondary | ICD-10-CM | POA: Diagnosis not present

## 2020-11-17 DIAGNOSIS — Z86718 Personal history of other venous thrombosis and embolism: Secondary | ICD-10-CM | POA: Diagnosis not present

## 2020-11-17 DIAGNOSIS — R9431 Abnormal electrocardiogram [ECG] [EKG]: Secondary | ICD-10-CM | POA: Diagnosis not present

## 2020-11-17 DIAGNOSIS — N186 End stage renal disease: Secondary | ICD-10-CM | POA: Diagnosis not present

## 2020-11-17 DIAGNOSIS — M6281 Muscle weakness (generalized): Secondary | ICD-10-CM | POA: Diagnosis not present

## 2020-11-17 DIAGNOSIS — E1165 Type 2 diabetes mellitus with hyperglycemia: Secondary | ICD-10-CM | POA: Diagnosis not present

## 2020-11-17 DIAGNOSIS — R2689 Other abnormalities of gait and mobility: Secondary | ICD-10-CM | POA: Diagnosis not present

## 2020-11-17 DIAGNOSIS — E119 Type 2 diabetes mellitus without complications: Secondary | ICD-10-CM | POA: Diagnosis not present

## 2020-11-17 DIAGNOSIS — R000 Tachycardia, unspecified: Secondary | ICD-10-CM | POA: Diagnosis not present

## 2020-11-17 DIAGNOSIS — R2681 Unsteadiness on feet: Secondary | ICD-10-CM | POA: Diagnosis not present

## 2020-11-17 DIAGNOSIS — R262 Difficulty in walking, not elsewhere classified: Secondary | ICD-10-CM | POA: Diagnosis not present

## 2020-11-17 DIAGNOSIS — I69351 Hemiplegia and hemiparesis following cerebral infarction affecting right dominant side: Secondary | ICD-10-CM | POA: Diagnosis not present

## 2020-11-17 DIAGNOSIS — D6861 Antiphospholipid syndrome: Secondary | ICD-10-CM | POA: Diagnosis not present

## 2020-11-17 DIAGNOSIS — R110 Nausea: Secondary | ICD-10-CM | POA: Diagnosis not present

## 2020-11-17 DIAGNOSIS — M6259 Muscle wasting and atrophy, not elsewhere classified, multiple sites: Secondary | ICD-10-CM | POA: Diagnosis not present

## 2020-11-17 DIAGNOSIS — I10 Essential (primary) hypertension: Secondary | ICD-10-CM | POA: Diagnosis not present

## 2020-11-17 DIAGNOSIS — R278 Other lack of coordination: Secondary | ICD-10-CM | POA: Diagnosis not present

## 2020-11-17 DIAGNOSIS — E038 Other specified hypothyroidism: Secondary | ICD-10-CM | POA: Diagnosis not present

## 2020-11-17 DIAGNOSIS — R799 Abnormal finding of blood chemistry, unspecified: Secondary | ICD-10-CM | POA: Diagnosis not present

## 2020-11-17 DIAGNOSIS — J1282 Pneumonia due to coronavirus disease 2019: Secondary | ICD-10-CM | POA: Diagnosis not present

## 2020-11-17 DIAGNOSIS — I130 Hypertensive heart and chronic kidney disease with heart failure and stage 1 through stage 4 chronic kidney disease, or unspecified chronic kidney disease: Secondary | ICD-10-CM | POA: Diagnosis not present

## 2020-11-17 DIAGNOSIS — R4781 Slurred speech: Secondary | ICD-10-CM | POA: Diagnosis not present

## 2020-11-17 DIAGNOSIS — R109 Unspecified abdominal pain: Secondary | ICD-10-CM | POA: Diagnosis not present

## 2020-11-18 DIAGNOSIS — R1312 Dysphagia, oropharyngeal phase: Secondary | ICD-10-CM | POA: Diagnosis not present

## 2020-11-18 DIAGNOSIS — R4781 Slurred speech: Secondary | ICD-10-CM | POA: Diagnosis not present

## 2020-11-18 DIAGNOSIS — I69353 Hemiplegia and hemiparesis following cerebral infarction affecting right non-dominant side: Secondary | ICD-10-CM | POA: Diagnosis not present

## 2020-11-18 DIAGNOSIS — I10 Essential (primary) hypertension: Secondary | ICD-10-CM | POA: Diagnosis not present

## 2020-11-18 DIAGNOSIS — M6281 Muscle weakness (generalized): Secondary | ICD-10-CM | POA: Diagnosis not present

## 2020-11-18 DIAGNOSIS — N186 End stage renal disease: Secondary | ICD-10-CM | POA: Diagnosis not present

## 2020-11-18 DIAGNOSIS — E119 Type 2 diabetes mellitus without complications: Secondary | ICD-10-CM | POA: Diagnosis not present

## 2020-11-18 DIAGNOSIS — J1282 Pneumonia due to coronavirus disease 2019: Secondary | ICD-10-CM | POA: Diagnosis not present

## 2020-11-18 DIAGNOSIS — R2681 Unsteadiness on feet: Secondary | ICD-10-CM | POA: Diagnosis not present

## 2020-11-18 DIAGNOSIS — M6259 Muscle wasting and atrophy, not elsewhere classified, multiple sites: Secondary | ICD-10-CM | POA: Diagnosis not present

## 2020-11-18 DIAGNOSIS — R262 Difficulty in walking, not elsewhere classified: Secondary | ICD-10-CM | POA: Diagnosis not present

## 2020-11-18 DIAGNOSIS — R278 Other lack of coordination: Secondary | ICD-10-CM | POA: Diagnosis not present

## 2020-11-18 DIAGNOSIS — R2689 Other abnormalities of gait and mobility: Secondary | ICD-10-CM | POA: Diagnosis not present

## 2020-11-19 DIAGNOSIS — N390 Urinary tract infection, site not specified: Secondary | ICD-10-CM | POA: Diagnosis not present

## 2020-11-19 DIAGNOSIS — Z5181 Encounter for therapeutic drug level monitoring: Secondary | ICD-10-CM | POA: Diagnosis not present

## 2020-11-20 DIAGNOSIS — R1312 Dysphagia, oropharyngeal phase: Secondary | ICD-10-CM | POA: Diagnosis not present

## 2020-11-20 DIAGNOSIS — I10 Essential (primary) hypertension: Secondary | ICD-10-CM | POA: Diagnosis not present

## 2020-11-20 DIAGNOSIS — M6281 Muscle weakness (generalized): Secondary | ICD-10-CM | POA: Diagnosis not present

## 2020-11-20 DIAGNOSIS — M6259 Muscle wasting and atrophy, not elsewhere classified, multiple sites: Secondary | ICD-10-CM | POA: Diagnosis not present

## 2020-11-20 DIAGNOSIS — N186 End stage renal disease: Secondary | ICD-10-CM | POA: Diagnosis not present

## 2020-11-20 DIAGNOSIS — R2681 Unsteadiness on feet: Secondary | ICD-10-CM | POA: Diagnosis not present

## 2020-11-20 DIAGNOSIS — I69353 Hemiplegia and hemiparesis following cerebral infarction affecting right non-dominant side: Secondary | ICD-10-CM | POA: Diagnosis not present

## 2020-11-20 DIAGNOSIS — J1282 Pneumonia due to coronavirus disease 2019: Secondary | ICD-10-CM | POA: Diagnosis not present

## 2020-11-20 DIAGNOSIS — R262 Difficulty in walking, not elsewhere classified: Secondary | ICD-10-CM | POA: Diagnosis not present

## 2020-11-20 DIAGNOSIS — E119 Type 2 diabetes mellitus without complications: Secondary | ICD-10-CM | POA: Diagnosis not present

## 2020-11-20 DIAGNOSIS — R2689 Other abnormalities of gait and mobility: Secondary | ICD-10-CM | POA: Diagnosis not present

## 2020-11-20 DIAGNOSIS — R4781 Slurred speech: Secondary | ICD-10-CM | POA: Diagnosis not present

## 2020-11-20 DIAGNOSIS — R278 Other lack of coordination: Secondary | ICD-10-CM | POA: Diagnosis not present

## 2020-11-21 DIAGNOSIS — R2681 Unsteadiness on feet: Secondary | ICD-10-CM | POA: Diagnosis not present

## 2020-11-21 DIAGNOSIS — M6259 Muscle wasting and atrophy, not elsewhere classified, multiple sites: Secondary | ICD-10-CM | POA: Diagnosis not present

## 2020-11-21 DIAGNOSIS — M6281 Muscle weakness (generalized): Secondary | ICD-10-CM | POA: Diagnosis not present

## 2020-11-21 DIAGNOSIS — R4781 Slurred speech: Secondary | ICD-10-CM | POA: Diagnosis not present

## 2020-11-21 DIAGNOSIS — J1282 Pneumonia due to coronavirus disease 2019: Secondary | ICD-10-CM | POA: Diagnosis not present

## 2020-11-21 DIAGNOSIS — N186 End stage renal disease: Secondary | ICD-10-CM | POA: Diagnosis not present

## 2020-11-21 DIAGNOSIS — R278 Other lack of coordination: Secondary | ICD-10-CM | POA: Diagnosis not present

## 2020-11-21 DIAGNOSIS — I10 Essential (primary) hypertension: Secondary | ICD-10-CM | POA: Diagnosis not present

## 2020-11-21 DIAGNOSIS — R1312 Dysphagia, oropharyngeal phase: Secondary | ICD-10-CM | POA: Diagnosis not present

## 2020-11-21 DIAGNOSIS — M1990 Unspecified osteoarthritis, unspecified site: Secondary | ICD-10-CM | POA: Diagnosis not present

## 2020-11-21 DIAGNOSIS — I69353 Hemiplegia and hemiparesis following cerebral infarction affecting right non-dominant side: Secondary | ICD-10-CM | POA: Diagnosis not present

## 2020-11-21 DIAGNOSIS — R2689 Other abnormalities of gait and mobility: Secondary | ICD-10-CM | POA: Diagnosis not present

## 2020-11-21 DIAGNOSIS — E119 Type 2 diabetes mellitus without complications: Secondary | ICD-10-CM | POA: Diagnosis not present

## 2020-11-21 DIAGNOSIS — R262 Difficulty in walking, not elsewhere classified: Secondary | ICD-10-CM | POA: Diagnosis not present

## 2020-11-24 DIAGNOSIS — M6281 Muscle weakness (generalized): Secondary | ICD-10-CM | POA: Diagnosis not present

## 2020-11-24 DIAGNOSIS — R262 Difficulty in walking, not elsewhere classified: Secondary | ICD-10-CM | POA: Diagnosis not present

## 2020-11-24 DIAGNOSIS — E119 Type 2 diabetes mellitus without complications: Secondary | ICD-10-CM | POA: Diagnosis not present

## 2020-11-24 DIAGNOSIS — R2689 Other abnormalities of gait and mobility: Secondary | ICD-10-CM | POA: Diagnosis not present

## 2020-11-24 DIAGNOSIS — R278 Other lack of coordination: Secondary | ICD-10-CM | POA: Diagnosis not present

## 2020-11-24 DIAGNOSIS — M6259 Muscle wasting and atrophy, not elsewhere classified, multiple sites: Secondary | ICD-10-CM | POA: Diagnosis not present

## 2020-11-24 DIAGNOSIS — R2681 Unsteadiness on feet: Secondary | ICD-10-CM | POA: Diagnosis not present

## 2020-11-24 DIAGNOSIS — J1282 Pneumonia due to coronavirus disease 2019: Secondary | ICD-10-CM | POA: Diagnosis not present

## 2020-11-24 DIAGNOSIS — R4781 Slurred speech: Secondary | ICD-10-CM | POA: Diagnosis not present

## 2020-11-24 DIAGNOSIS — R1312 Dysphagia, oropharyngeal phase: Secondary | ICD-10-CM | POA: Diagnosis not present

## 2020-11-24 DIAGNOSIS — I69353 Hemiplegia and hemiparesis following cerebral infarction affecting right non-dominant side: Secondary | ICD-10-CM | POA: Diagnosis not present

## 2020-11-24 DIAGNOSIS — I10 Essential (primary) hypertension: Secondary | ICD-10-CM | POA: Diagnosis not present

## 2020-11-24 DIAGNOSIS — N186 End stage renal disease: Secondary | ICD-10-CM | POA: Diagnosis not present

## 2020-11-25 DIAGNOSIS — R278 Other lack of coordination: Secondary | ICD-10-CM | POA: Diagnosis not present

## 2020-11-25 DIAGNOSIS — R1312 Dysphagia, oropharyngeal phase: Secondary | ICD-10-CM | POA: Diagnosis not present

## 2020-11-25 DIAGNOSIS — I69353 Hemiplegia and hemiparesis following cerebral infarction affecting right non-dominant side: Secondary | ICD-10-CM | POA: Diagnosis not present

## 2020-11-25 DIAGNOSIS — R2681 Unsteadiness on feet: Secondary | ICD-10-CM | POA: Diagnosis not present

## 2020-11-25 DIAGNOSIS — N186 End stage renal disease: Secondary | ICD-10-CM | POA: Diagnosis not present

## 2020-11-25 DIAGNOSIS — M6259 Muscle wasting and atrophy, not elsewhere classified, multiple sites: Secondary | ICD-10-CM | POA: Diagnosis not present

## 2020-11-25 DIAGNOSIS — M6281 Muscle weakness (generalized): Secondary | ICD-10-CM | POA: Diagnosis not present

## 2020-11-25 DIAGNOSIS — R4781 Slurred speech: Secondary | ICD-10-CM | POA: Diagnosis not present

## 2020-11-25 DIAGNOSIS — R2689 Other abnormalities of gait and mobility: Secondary | ICD-10-CM | POA: Diagnosis not present

## 2020-11-25 DIAGNOSIS — J1282 Pneumonia due to coronavirus disease 2019: Secondary | ICD-10-CM | POA: Diagnosis not present

## 2020-11-25 DIAGNOSIS — R262 Difficulty in walking, not elsewhere classified: Secondary | ICD-10-CM | POA: Diagnosis not present

## 2020-11-25 DIAGNOSIS — E119 Type 2 diabetes mellitus without complications: Secondary | ICD-10-CM | POA: Diagnosis not present

## 2020-11-25 DIAGNOSIS — I10 Essential (primary) hypertension: Secondary | ICD-10-CM | POA: Diagnosis not present

## 2020-11-26 DIAGNOSIS — N186 End stage renal disease: Secondary | ICD-10-CM | POA: Diagnosis not present

## 2020-11-26 DIAGNOSIS — R2681 Unsteadiness on feet: Secondary | ICD-10-CM | POA: Diagnosis not present

## 2020-11-26 DIAGNOSIS — M6259 Muscle wasting and atrophy, not elsewhere classified, multiple sites: Secondary | ICD-10-CM | POA: Diagnosis not present

## 2020-11-26 DIAGNOSIS — R262 Difficulty in walking, not elsewhere classified: Secondary | ICD-10-CM | POA: Diagnosis not present

## 2020-11-26 DIAGNOSIS — R278 Other lack of coordination: Secondary | ICD-10-CM | POA: Diagnosis not present

## 2020-11-26 DIAGNOSIS — R4781 Slurred speech: Secondary | ICD-10-CM | POA: Diagnosis not present

## 2020-11-26 DIAGNOSIS — E119 Type 2 diabetes mellitus without complications: Secondary | ICD-10-CM | POA: Diagnosis not present

## 2020-11-26 DIAGNOSIS — I10 Essential (primary) hypertension: Secondary | ICD-10-CM | POA: Diagnosis not present

## 2020-11-26 DIAGNOSIS — R2689 Other abnormalities of gait and mobility: Secondary | ICD-10-CM | POA: Diagnosis not present

## 2020-11-26 DIAGNOSIS — I69353 Hemiplegia and hemiparesis following cerebral infarction affecting right non-dominant side: Secondary | ICD-10-CM | POA: Diagnosis not present

## 2020-11-26 DIAGNOSIS — R1312 Dysphagia, oropharyngeal phase: Secondary | ICD-10-CM | POA: Diagnosis not present

## 2020-11-26 DIAGNOSIS — J1282 Pneumonia due to coronavirus disease 2019: Secondary | ICD-10-CM | POA: Diagnosis not present

## 2020-11-26 DIAGNOSIS — M6281 Muscle weakness (generalized): Secondary | ICD-10-CM | POA: Diagnosis not present

## 2020-11-27 DIAGNOSIS — E119 Type 2 diabetes mellitus without complications: Secondary | ICD-10-CM | POA: Diagnosis not present

## 2020-11-27 DIAGNOSIS — R1312 Dysphagia, oropharyngeal phase: Secondary | ICD-10-CM | POA: Diagnosis not present

## 2020-11-27 DIAGNOSIS — M6259 Muscle wasting and atrophy, not elsewhere classified, multiple sites: Secondary | ICD-10-CM | POA: Diagnosis not present

## 2020-11-27 DIAGNOSIS — J1282 Pneumonia due to coronavirus disease 2019: Secondary | ICD-10-CM | POA: Diagnosis not present

## 2020-11-27 DIAGNOSIS — R262 Difficulty in walking, not elsewhere classified: Secondary | ICD-10-CM | POA: Diagnosis not present

## 2020-11-27 DIAGNOSIS — R2681 Unsteadiness on feet: Secondary | ICD-10-CM | POA: Diagnosis not present

## 2020-11-27 DIAGNOSIS — N186 End stage renal disease: Secondary | ICD-10-CM | POA: Diagnosis not present

## 2020-11-27 DIAGNOSIS — R4781 Slurred speech: Secondary | ICD-10-CM | POA: Diagnosis not present

## 2020-11-27 DIAGNOSIS — I10 Essential (primary) hypertension: Secondary | ICD-10-CM | POA: Diagnosis not present

## 2020-11-27 DIAGNOSIS — R278 Other lack of coordination: Secondary | ICD-10-CM | POA: Diagnosis not present

## 2020-11-27 DIAGNOSIS — M6281 Muscle weakness (generalized): Secondary | ICD-10-CM | POA: Diagnosis not present

## 2020-11-27 DIAGNOSIS — R2689 Other abnormalities of gait and mobility: Secondary | ICD-10-CM | POA: Diagnosis not present

## 2020-11-27 DIAGNOSIS — I69353 Hemiplegia and hemiparesis following cerebral infarction affecting right non-dominant side: Secondary | ICD-10-CM | POA: Diagnosis not present

## 2020-12-01 DIAGNOSIS — J1282 Pneumonia due to coronavirus disease 2019: Secondary | ICD-10-CM | POA: Diagnosis not present

## 2020-12-01 DIAGNOSIS — M6281 Muscle weakness (generalized): Secondary | ICD-10-CM | POA: Diagnosis not present

## 2020-12-01 DIAGNOSIS — R2681 Unsteadiness on feet: Secondary | ICD-10-CM | POA: Diagnosis not present

## 2020-12-01 DIAGNOSIS — R1312 Dysphagia, oropharyngeal phase: Secondary | ICD-10-CM | POA: Diagnosis not present

## 2020-12-01 DIAGNOSIS — I69353 Hemiplegia and hemiparesis following cerebral infarction affecting right non-dominant side: Secondary | ICD-10-CM | POA: Diagnosis not present

## 2020-12-01 DIAGNOSIS — R4781 Slurred speech: Secondary | ICD-10-CM | POA: Diagnosis not present

## 2020-12-01 DIAGNOSIS — E119 Type 2 diabetes mellitus without complications: Secondary | ICD-10-CM | POA: Diagnosis not present

## 2020-12-01 DIAGNOSIS — M6259 Muscle wasting and atrophy, not elsewhere classified, multiple sites: Secondary | ICD-10-CM | POA: Diagnosis not present

## 2020-12-01 DIAGNOSIS — R2689 Other abnormalities of gait and mobility: Secondary | ICD-10-CM | POA: Diagnosis not present

## 2020-12-01 DIAGNOSIS — R262 Difficulty in walking, not elsewhere classified: Secondary | ICD-10-CM | POA: Diagnosis not present

## 2020-12-01 DIAGNOSIS — N186 End stage renal disease: Secondary | ICD-10-CM | POA: Diagnosis not present

## 2020-12-01 DIAGNOSIS — R278 Other lack of coordination: Secondary | ICD-10-CM | POA: Diagnosis not present

## 2020-12-01 DIAGNOSIS — I10 Essential (primary) hypertension: Secondary | ICD-10-CM | POA: Diagnosis not present

## 2020-12-02 DIAGNOSIS — M6259 Muscle wasting and atrophy, not elsewhere classified, multiple sites: Secondary | ICD-10-CM | POA: Diagnosis not present

## 2020-12-02 DIAGNOSIS — R262 Difficulty in walking, not elsewhere classified: Secondary | ICD-10-CM | POA: Diagnosis not present

## 2020-12-02 DIAGNOSIS — R4781 Slurred speech: Secondary | ICD-10-CM | POA: Diagnosis not present

## 2020-12-02 DIAGNOSIS — I69353 Hemiplegia and hemiparesis following cerebral infarction affecting right non-dominant side: Secondary | ICD-10-CM | POA: Diagnosis not present

## 2020-12-02 DIAGNOSIS — N186 End stage renal disease: Secondary | ICD-10-CM | POA: Diagnosis not present

## 2020-12-02 DIAGNOSIS — R2689 Other abnormalities of gait and mobility: Secondary | ICD-10-CM | POA: Diagnosis not present

## 2020-12-02 DIAGNOSIS — E119 Type 2 diabetes mellitus without complications: Secondary | ICD-10-CM | POA: Diagnosis not present

## 2020-12-02 DIAGNOSIS — R278 Other lack of coordination: Secondary | ICD-10-CM | POA: Diagnosis not present

## 2020-12-02 DIAGNOSIS — J1282 Pneumonia due to coronavirus disease 2019: Secondary | ICD-10-CM | POA: Diagnosis not present

## 2020-12-02 DIAGNOSIS — I10 Essential (primary) hypertension: Secondary | ICD-10-CM | POA: Diagnosis not present

## 2020-12-02 DIAGNOSIS — R2681 Unsteadiness on feet: Secondary | ICD-10-CM | POA: Diagnosis not present

## 2020-12-02 DIAGNOSIS — M6281 Muscle weakness (generalized): Secondary | ICD-10-CM | POA: Diagnosis not present

## 2020-12-02 DIAGNOSIS — R1312 Dysphagia, oropharyngeal phase: Secondary | ICD-10-CM | POA: Diagnosis not present

## 2020-12-03 DIAGNOSIS — N186 End stage renal disease: Secondary | ICD-10-CM | POA: Diagnosis not present

## 2020-12-03 DIAGNOSIS — J1282 Pneumonia due to coronavirus disease 2019: Secondary | ICD-10-CM | POA: Diagnosis not present

## 2020-12-03 DIAGNOSIS — R4781 Slurred speech: Secondary | ICD-10-CM | POA: Diagnosis not present

## 2020-12-03 DIAGNOSIS — R262 Difficulty in walking, not elsewhere classified: Secondary | ICD-10-CM | POA: Diagnosis not present

## 2020-12-03 DIAGNOSIS — E119 Type 2 diabetes mellitus without complications: Secondary | ICD-10-CM | POA: Diagnosis not present

## 2020-12-03 DIAGNOSIS — R1312 Dysphagia, oropharyngeal phase: Secondary | ICD-10-CM | POA: Diagnosis not present

## 2020-12-03 DIAGNOSIS — M6259 Muscle wasting and atrophy, not elsewhere classified, multiple sites: Secondary | ICD-10-CM | POA: Diagnosis not present

## 2020-12-03 DIAGNOSIS — M6281 Muscle weakness (generalized): Secondary | ICD-10-CM | POA: Diagnosis not present

## 2020-12-03 DIAGNOSIS — R2681 Unsteadiness on feet: Secondary | ICD-10-CM | POA: Diagnosis not present

## 2020-12-03 DIAGNOSIS — I69353 Hemiplegia and hemiparesis following cerebral infarction affecting right non-dominant side: Secondary | ICD-10-CM | POA: Diagnosis not present

## 2020-12-03 DIAGNOSIS — I10 Essential (primary) hypertension: Secondary | ICD-10-CM | POA: Diagnosis not present

## 2020-12-03 DIAGNOSIS — R278 Other lack of coordination: Secondary | ICD-10-CM | POA: Diagnosis not present

## 2020-12-03 DIAGNOSIS — R2689 Other abnormalities of gait and mobility: Secondary | ICD-10-CM | POA: Diagnosis not present

## 2020-12-04 DIAGNOSIS — R2681 Unsteadiness on feet: Secondary | ICD-10-CM | POA: Diagnosis not present

## 2020-12-04 DIAGNOSIS — J1282 Pneumonia due to coronavirus disease 2019: Secondary | ICD-10-CM | POA: Diagnosis not present

## 2020-12-04 DIAGNOSIS — M6281 Muscle weakness (generalized): Secondary | ICD-10-CM | POA: Diagnosis not present

## 2020-12-04 DIAGNOSIS — E119 Type 2 diabetes mellitus without complications: Secondary | ICD-10-CM | POA: Diagnosis not present

## 2020-12-04 DIAGNOSIS — R4781 Slurred speech: Secondary | ICD-10-CM | POA: Diagnosis not present

## 2020-12-04 DIAGNOSIS — R2689 Other abnormalities of gait and mobility: Secondary | ICD-10-CM | POA: Diagnosis not present

## 2020-12-04 DIAGNOSIS — R1312 Dysphagia, oropharyngeal phase: Secondary | ICD-10-CM | POA: Diagnosis not present

## 2020-12-04 DIAGNOSIS — M6259 Muscle wasting and atrophy, not elsewhere classified, multiple sites: Secondary | ICD-10-CM | POA: Diagnosis not present

## 2020-12-04 DIAGNOSIS — R278 Other lack of coordination: Secondary | ICD-10-CM | POA: Diagnosis not present

## 2020-12-04 DIAGNOSIS — I10 Essential (primary) hypertension: Secondary | ICD-10-CM | POA: Diagnosis not present

## 2020-12-04 DIAGNOSIS — R262 Difficulty in walking, not elsewhere classified: Secondary | ICD-10-CM | POA: Diagnosis not present

## 2020-12-04 DIAGNOSIS — I69353 Hemiplegia and hemiparesis following cerebral infarction affecting right non-dominant side: Secondary | ICD-10-CM | POA: Diagnosis not present

## 2020-12-04 DIAGNOSIS — N186 End stage renal disease: Secondary | ICD-10-CM | POA: Diagnosis not present

## 2020-12-05 DIAGNOSIS — N186 End stage renal disease: Secondary | ICD-10-CM | POA: Diagnosis not present

## 2020-12-05 DIAGNOSIS — R1312 Dysphagia, oropharyngeal phase: Secondary | ICD-10-CM | POA: Diagnosis not present

## 2020-12-05 DIAGNOSIS — R278 Other lack of coordination: Secondary | ICD-10-CM | POA: Diagnosis not present

## 2020-12-05 DIAGNOSIS — J1282 Pneumonia due to coronavirus disease 2019: Secondary | ICD-10-CM | POA: Diagnosis not present

## 2020-12-05 DIAGNOSIS — I69353 Hemiplegia and hemiparesis following cerebral infarction affecting right non-dominant side: Secondary | ICD-10-CM | POA: Diagnosis not present

## 2020-12-05 DIAGNOSIS — E119 Type 2 diabetes mellitus without complications: Secondary | ICD-10-CM | POA: Diagnosis not present

## 2020-12-05 DIAGNOSIS — R4781 Slurred speech: Secondary | ICD-10-CM | POA: Diagnosis not present

## 2020-12-05 DIAGNOSIS — R262 Difficulty in walking, not elsewhere classified: Secondary | ICD-10-CM | POA: Diagnosis not present

## 2020-12-05 DIAGNOSIS — I10 Essential (primary) hypertension: Secondary | ICD-10-CM | POA: Diagnosis not present

## 2020-12-05 DIAGNOSIS — M6259 Muscle wasting and atrophy, not elsewhere classified, multiple sites: Secondary | ICD-10-CM | POA: Diagnosis not present

## 2020-12-05 DIAGNOSIS — R2681 Unsteadiness on feet: Secondary | ICD-10-CM | POA: Diagnosis not present

## 2020-12-05 DIAGNOSIS — M6281 Muscle weakness (generalized): Secondary | ICD-10-CM | POA: Diagnosis not present

## 2020-12-05 DIAGNOSIS — R2689 Other abnormalities of gait and mobility: Secondary | ICD-10-CM | POA: Diagnosis not present

## 2020-12-06 DIAGNOSIS — M6259 Muscle wasting and atrophy, not elsewhere classified, multiple sites: Secondary | ICD-10-CM | POA: Diagnosis not present

## 2020-12-06 DIAGNOSIS — R2681 Unsteadiness on feet: Secondary | ICD-10-CM | POA: Diagnosis not present

## 2020-12-06 DIAGNOSIS — M6281 Muscle weakness (generalized): Secondary | ICD-10-CM | POA: Diagnosis not present

## 2020-12-06 DIAGNOSIS — R4781 Slurred speech: Secondary | ICD-10-CM | POA: Diagnosis not present

## 2020-12-06 DIAGNOSIS — E119 Type 2 diabetes mellitus without complications: Secondary | ICD-10-CM | POA: Diagnosis not present

## 2020-12-06 DIAGNOSIS — R1312 Dysphagia, oropharyngeal phase: Secondary | ICD-10-CM | POA: Diagnosis not present

## 2020-12-06 DIAGNOSIS — I10 Essential (primary) hypertension: Secondary | ICD-10-CM | POA: Diagnosis not present

## 2020-12-06 DIAGNOSIS — N186 End stage renal disease: Secondary | ICD-10-CM | POA: Diagnosis not present

## 2020-12-06 DIAGNOSIS — R278 Other lack of coordination: Secondary | ICD-10-CM | POA: Diagnosis not present

## 2020-12-06 DIAGNOSIS — R262 Difficulty in walking, not elsewhere classified: Secondary | ICD-10-CM | POA: Diagnosis not present

## 2020-12-06 DIAGNOSIS — J1282 Pneumonia due to coronavirus disease 2019: Secondary | ICD-10-CM | POA: Diagnosis not present

## 2020-12-06 DIAGNOSIS — R2689 Other abnormalities of gait and mobility: Secondary | ICD-10-CM | POA: Diagnosis not present

## 2020-12-06 DIAGNOSIS — I69353 Hemiplegia and hemiparesis following cerebral infarction affecting right non-dominant side: Secondary | ICD-10-CM | POA: Diagnosis not present

## 2020-12-09 DIAGNOSIS — I69353 Hemiplegia and hemiparesis following cerebral infarction affecting right non-dominant side: Secondary | ICD-10-CM | POA: Diagnosis not present

## 2020-12-09 DIAGNOSIS — R262 Difficulty in walking, not elsewhere classified: Secondary | ICD-10-CM | POA: Diagnosis not present

## 2020-12-09 DIAGNOSIS — R1312 Dysphagia, oropharyngeal phase: Secondary | ICD-10-CM | POA: Diagnosis not present

## 2020-12-09 DIAGNOSIS — M6281 Muscle weakness (generalized): Secondary | ICD-10-CM | POA: Diagnosis not present

## 2020-12-09 DIAGNOSIS — R4781 Slurred speech: Secondary | ICD-10-CM | POA: Diagnosis not present

## 2020-12-09 DIAGNOSIS — R2689 Other abnormalities of gait and mobility: Secondary | ICD-10-CM | POA: Diagnosis not present

## 2020-12-09 DIAGNOSIS — R278 Other lack of coordination: Secondary | ICD-10-CM | POA: Diagnosis not present

## 2020-12-09 DIAGNOSIS — J1282 Pneumonia due to coronavirus disease 2019: Secondary | ICD-10-CM | POA: Diagnosis not present

## 2020-12-09 DIAGNOSIS — R2681 Unsteadiness on feet: Secondary | ICD-10-CM | POA: Diagnosis not present

## 2020-12-09 DIAGNOSIS — I10 Essential (primary) hypertension: Secondary | ICD-10-CM | POA: Diagnosis not present

## 2020-12-09 DIAGNOSIS — M6259 Muscle wasting and atrophy, not elsewhere classified, multiple sites: Secondary | ICD-10-CM | POA: Diagnosis not present

## 2020-12-09 DIAGNOSIS — E119 Type 2 diabetes mellitus without complications: Secondary | ICD-10-CM | POA: Diagnosis not present

## 2020-12-09 DIAGNOSIS — N186 End stage renal disease: Secondary | ICD-10-CM | POA: Diagnosis not present

## 2020-12-10 DIAGNOSIS — R278 Other lack of coordination: Secondary | ICD-10-CM | POA: Diagnosis not present

## 2020-12-10 DIAGNOSIS — M6259 Muscle wasting and atrophy, not elsewhere classified, multiple sites: Secondary | ICD-10-CM | POA: Diagnosis not present

## 2020-12-10 DIAGNOSIS — R4781 Slurred speech: Secondary | ICD-10-CM | POA: Diagnosis not present

## 2020-12-10 DIAGNOSIS — N186 End stage renal disease: Secondary | ICD-10-CM | POA: Diagnosis not present

## 2020-12-10 DIAGNOSIS — I69353 Hemiplegia and hemiparesis following cerebral infarction affecting right non-dominant side: Secondary | ICD-10-CM | POA: Diagnosis not present

## 2020-12-10 DIAGNOSIS — J1282 Pneumonia due to coronavirus disease 2019: Secondary | ICD-10-CM | POA: Diagnosis not present

## 2020-12-10 DIAGNOSIS — R2689 Other abnormalities of gait and mobility: Secondary | ICD-10-CM | POA: Diagnosis not present

## 2020-12-10 DIAGNOSIS — R1312 Dysphagia, oropharyngeal phase: Secondary | ICD-10-CM | POA: Diagnosis not present

## 2020-12-10 DIAGNOSIS — I10 Essential (primary) hypertension: Secondary | ICD-10-CM | POA: Diagnosis not present

## 2020-12-10 DIAGNOSIS — Z940 Kidney transplant status: Secondary | ICD-10-CM | POA: Diagnosis not present

## 2020-12-10 DIAGNOSIS — E119 Type 2 diabetes mellitus without complications: Secondary | ICD-10-CM | POA: Diagnosis not present

## 2020-12-10 DIAGNOSIS — R2681 Unsteadiness on feet: Secondary | ICD-10-CM | POA: Diagnosis not present

## 2020-12-10 DIAGNOSIS — M6281 Muscle weakness (generalized): Secondary | ICD-10-CM | POA: Diagnosis not present

## 2020-12-10 DIAGNOSIS — R112 Nausea with vomiting, unspecified: Secondary | ICD-10-CM | POA: Diagnosis not present

## 2020-12-10 DIAGNOSIS — Z86718 Personal history of other venous thrombosis and embolism: Secondary | ICD-10-CM | POA: Diagnosis not present

## 2020-12-10 DIAGNOSIS — R262 Difficulty in walking, not elsewhere classified: Secondary | ICD-10-CM | POA: Diagnosis not present

## 2020-12-11 DIAGNOSIS — J1282 Pneumonia due to coronavirus disease 2019: Secondary | ICD-10-CM | POA: Diagnosis not present

## 2020-12-11 DIAGNOSIS — I10 Essential (primary) hypertension: Secondary | ICD-10-CM | POA: Diagnosis not present

## 2020-12-11 DIAGNOSIS — R278 Other lack of coordination: Secondary | ICD-10-CM | POA: Diagnosis not present

## 2020-12-11 DIAGNOSIS — M6281 Muscle weakness (generalized): Secondary | ICD-10-CM | POA: Diagnosis not present

## 2020-12-11 DIAGNOSIS — R2681 Unsteadiness on feet: Secondary | ICD-10-CM | POA: Diagnosis not present

## 2020-12-11 DIAGNOSIS — R2689 Other abnormalities of gait and mobility: Secondary | ICD-10-CM | POA: Diagnosis not present

## 2020-12-11 DIAGNOSIS — N186 End stage renal disease: Secondary | ICD-10-CM | POA: Diagnosis not present

## 2020-12-11 DIAGNOSIS — R262 Difficulty in walking, not elsewhere classified: Secondary | ICD-10-CM | POA: Diagnosis not present

## 2020-12-11 DIAGNOSIS — R4781 Slurred speech: Secondary | ICD-10-CM | POA: Diagnosis not present

## 2020-12-11 DIAGNOSIS — R1312 Dysphagia, oropharyngeal phase: Secondary | ICD-10-CM | POA: Diagnosis not present

## 2020-12-11 DIAGNOSIS — E119 Type 2 diabetes mellitus without complications: Secondary | ICD-10-CM | POA: Diagnosis not present

## 2020-12-11 DIAGNOSIS — I69353 Hemiplegia and hemiparesis following cerebral infarction affecting right non-dominant side: Secondary | ICD-10-CM | POA: Diagnosis not present

## 2020-12-11 DIAGNOSIS — M6259 Muscle wasting and atrophy, not elsewhere classified, multiple sites: Secondary | ICD-10-CM | POA: Diagnosis not present

## 2020-12-12 DIAGNOSIS — E119 Type 2 diabetes mellitus without complications: Secondary | ICD-10-CM | POA: Diagnosis not present

## 2020-12-12 DIAGNOSIS — M6259 Muscle wasting and atrophy, not elsewhere classified, multiple sites: Secondary | ICD-10-CM | POA: Diagnosis not present

## 2020-12-12 DIAGNOSIS — Z86718 Personal history of other venous thrombosis and embolism: Secondary | ICD-10-CM | POA: Diagnosis not present

## 2020-12-12 DIAGNOSIS — I69351 Hemiplegia and hemiparesis following cerebral infarction affecting right dominant side: Secondary | ICD-10-CM | POA: Diagnosis not present

## 2020-12-12 DIAGNOSIS — R1312 Dysphagia, oropharyngeal phase: Secondary | ICD-10-CM | POA: Diagnosis not present

## 2020-12-12 DIAGNOSIS — R278 Other lack of coordination: Secondary | ICD-10-CM | POA: Diagnosis not present

## 2020-12-12 DIAGNOSIS — R110 Nausea: Secondary | ICD-10-CM | POA: Diagnosis not present

## 2020-12-12 DIAGNOSIS — R2689 Other abnormalities of gait and mobility: Secondary | ICD-10-CM | POA: Diagnosis not present

## 2020-12-12 DIAGNOSIS — M6281 Muscle weakness (generalized): Secondary | ICD-10-CM | POA: Diagnosis not present

## 2020-12-12 DIAGNOSIS — R4781 Slurred speech: Secondary | ICD-10-CM | POA: Diagnosis not present

## 2020-12-12 DIAGNOSIS — E1165 Type 2 diabetes mellitus with hyperglycemia: Secondary | ICD-10-CM | POA: Diagnosis not present

## 2020-12-12 DIAGNOSIS — R2681 Unsteadiness on feet: Secondary | ICD-10-CM | POA: Diagnosis not present

## 2020-12-12 DIAGNOSIS — I5032 Chronic diastolic (congestive) heart failure: Secondary | ICD-10-CM | POA: Diagnosis not present

## 2020-12-12 DIAGNOSIS — I69353 Hemiplegia and hemiparesis following cerebral infarction affecting right non-dominant side: Secondary | ICD-10-CM | POA: Diagnosis not present

## 2020-12-12 DIAGNOSIS — Z7901 Long term (current) use of anticoagulants: Secondary | ICD-10-CM | POA: Diagnosis not present

## 2020-12-12 DIAGNOSIS — E038 Other specified hypothyroidism: Secondary | ICD-10-CM | POA: Diagnosis not present

## 2020-12-12 DIAGNOSIS — J1282 Pneumonia due to coronavirus disease 2019: Secondary | ICD-10-CM | POA: Diagnosis not present

## 2020-12-12 DIAGNOSIS — R112 Nausea with vomiting, unspecified: Secondary | ICD-10-CM | POA: Diagnosis not present

## 2020-12-12 DIAGNOSIS — N186 End stage renal disease: Secondary | ICD-10-CM | POA: Diagnosis not present

## 2020-12-12 DIAGNOSIS — G894 Chronic pain syndrome: Secondary | ICD-10-CM | POA: Diagnosis not present

## 2020-12-12 DIAGNOSIS — R262 Difficulty in walking, not elsewhere classified: Secondary | ICD-10-CM | POA: Diagnosis not present

## 2020-12-12 DIAGNOSIS — I10 Essential (primary) hypertension: Secondary | ICD-10-CM | POA: Diagnosis not present

## 2020-12-12 DIAGNOSIS — M818 Other osteoporosis without current pathological fracture: Secondary | ICD-10-CM | POA: Diagnosis not present

## 2020-12-15 DIAGNOSIS — R4781 Slurred speech: Secondary | ICD-10-CM | POA: Diagnosis not present

## 2020-12-15 DIAGNOSIS — M6281 Muscle weakness (generalized): Secondary | ICD-10-CM | POA: Diagnosis not present

## 2020-12-15 DIAGNOSIS — M6259 Muscle wasting and atrophy, not elsewhere classified, multiple sites: Secondary | ICD-10-CM | POA: Diagnosis not present

## 2020-12-15 DIAGNOSIS — I10 Essential (primary) hypertension: Secondary | ICD-10-CM | POA: Diagnosis not present

## 2020-12-15 DIAGNOSIS — J1282 Pneumonia due to coronavirus disease 2019: Secondary | ICD-10-CM | POA: Diagnosis not present

## 2020-12-15 DIAGNOSIS — R1312 Dysphagia, oropharyngeal phase: Secondary | ICD-10-CM | POA: Diagnosis not present

## 2020-12-15 DIAGNOSIS — R2689 Other abnormalities of gait and mobility: Secondary | ICD-10-CM | POA: Diagnosis not present

## 2020-12-15 DIAGNOSIS — N186 End stage renal disease: Secondary | ICD-10-CM | POA: Diagnosis not present

## 2020-12-15 DIAGNOSIS — R2681 Unsteadiness on feet: Secondary | ICD-10-CM | POA: Diagnosis not present

## 2020-12-15 DIAGNOSIS — R262 Difficulty in walking, not elsewhere classified: Secondary | ICD-10-CM | POA: Diagnosis not present

## 2020-12-15 DIAGNOSIS — I69353 Hemiplegia and hemiparesis following cerebral infarction affecting right non-dominant side: Secondary | ICD-10-CM | POA: Diagnosis not present

## 2020-12-15 DIAGNOSIS — R278 Other lack of coordination: Secondary | ICD-10-CM | POA: Diagnosis not present

## 2020-12-15 DIAGNOSIS — E119 Type 2 diabetes mellitus without complications: Secondary | ICD-10-CM | POA: Diagnosis not present

## 2020-12-16 DIAGNOSIS — R4781 Slurred speech: Secondary | ICD-10-CM | POA: Diagnosis not present

## 2020-12-16 DIAGNOSIS — R1312 Dysphagia, oropharyngeal phase: Secondary | ICD-10-CM | POA: Diagnosis not present

## 2020-12-16 DIAGNOSIS — R278 Other lack of coordination: Secondary | ICD-10-CM | POA: Diagnosis not present

## 2020-12-16 DIAGNOSIS — M6259 Muscle wasting and atrophy, not elsewhere classified, multiple sites: Secondary | ICD-10-CM | POA: Diagnosis not present

## 2020-12-16 DIAGNOSIS — G894 Chronic pain syndrome: Secondary | ICD-10-CM | POA: Diagnosis not present

## 2020-12-16 DIAGNOSIS — N186 End stage renal disease: Secondary | ICD-10-CM | POA: Diagnosis not present

## 2020-12-16 DIAGNOSIS — M6281 Muscle weakness (generalized): Secondary | ICD-10-CM | POA: Diagnosis not present

## 2020-12-16 DIAGNOSIS — J1282 Pneumonia due to coronavirus disease 2019: Secondary | ICD-10-CM | POA: Diagnosis not present

## 2020-12-16 DIAGNOSIS — E119 Type 2 diabetes mellitus without complications: Secondary | ICD-10-CM | POA: Diagnosis not present

## 2020-12-16 DIAGNOSIS — I69353 Hemiplegia and hemiparesis following cerebral infarction affecting right non-dominant side: Secondary | ICD-10-CM | POA: Diagnosis not present

## 2020-12-16 DIAGNOSIS — R2681 Unsteadiness on feet: Secondary | ICD-10-CM | POA: Diagnosis not present

## 2020-12-16 DIAGNOSIS — R262 Difficulty in walking, not elsewhere classified: Secondary | ICD-10-CM | POA: Diagnosis not present

## 2020-12-16 DIAGNOSIS — I10 Essential (primary) hypertension: Secondary | ICD-10-CM | POA: Diagnosis not present

## 2020-12-16 DIAGNOSIS — R2689 Other abnormalities of gait and mobility: Secondary | ICD-10-CM | POA: Diagnosis not present

## 2020-12-17 DIAGNOSIS — J1282 Pneumonia due to coronavirus disease 2019: Secondary | ICD-10-CM | POA: Diagnosis not present

## 2020-12-17 DIAGNOSIS — N186 End stage renal disease: Secondary | ICD-10-CM | POA: Diagnosis not present

## 2020-12-17 DIAGNOSIS — R4781 Slurred speech: Secondary | ICD-10-CM | POA: Diagnosis not present

## 2020-12-17 DIAGNOSIS — R2689 Other abnormalities of gait and mobility: Secondary | ICD-10-CM | POA: Diagnosis not present

## 2020-12-17 DIAGNOSIS — M6281 Muscle weakness (generalized): Secondary | ICD-10-CM | POA: Diagnosis not present

## 2020-12-17 DIAGNOSIS — R1312 Dysphagia, oropharyngeal phase: Secondary | ICD-10-CM | POA: Diagnosis not present

## 2020-12-17 DIAGNOSIS — E119 Type 2 diabetes mellitus without complications: Secondary | ICD-10-CM | POA: Diagnosis not present

## 2020-12-17 DIAGNOSIS — I69353 Hemiplegia and hemiparesis following cerebral infarction affecting right non-dominant side: Secondary | ICD-10-CM | POA: Diagnosis not present

## 2020-12-17 DIAGNOSIS — R262 Difficulty in walking, not elsewhere classified: Secondary | ICD-10-CM | POA: Diagnosis not present

## 2020-12-17 DIAGNOSIS — R2681 Unsteadiness on feet: Secondary | ICD-10-CM | POA: Diagnosis not present

## 2020-12-17 DIAGNOSIS — I10 Essential (primary) hypertension: Secondary | ICD-10-CM | POA: Diagnosis not present

## 2020-12-17 DIAGNOSIS — R278 Other lack of coordination: Secondary | ICD-10-CM | POA: Diagnosis not present

## 2020-12-17 DIAGNOSIS — M6259 Muscle wasting and atrophy, not elsewhere classified, multiple sites: Secondary | ICD-10-CM | POA: Diagnosis not present

## 2020-12-18 DIAGNOSIS — M6281 Muscle weakness (generalized): Secondary | ICD-10-CM | POA: Diagnosis not present

## 2020-12-18 DIAGNOSIS — R2689 Other abnormalities of gait and mobility: Secondary | ICD-10-CM | POA: Diagnosis not present

## 2020-12-18 DIAGNOSIS — Z7901 Long term (current) use of anticoagulants: Secondary | ICD-10-CM | POA: Diagnosis not present

## 2020-12-18 DIAGNOSIS — E119 Type 2 diabetes mellitus without complications: Secondary | ICD-10-CM | POA: Diagnosis not present

## 2020-12-18 DIAGNOSIS — R262 Difficulty in walking, not elsewhere classified: Secondary | ICD-10-CM | POA: Diagnosis not present

## 2020-12-18 DIAGNOSIS — R278 Other lack of coordination: Secondary | ICD-10-CM | POA: Diagnosis not present

## 2020-12-18 DIAGNOSIS — J1282 Pneumonia due to coronavirus disease 2019: Secondary | ICD-10-CM | POA: Diagnosis not present

## 2020-12-18 DIAGNOSIS — R4781 Slurred speech: Secondary | ICD-10-CM | POA: Diagnosis not present

## 2020-12-18 DIAGNOSIS — N186 End stage renal disease: Secondary | ICD-10-CM | POA: Diagnosis not present

## 2020-12-18 DIAGNOSIS — R2681 Unsteadiness on feet: Secondary | ICD-10-CM | POA: Diagnosis not present

## 2020-12-18 DIAGNOSIS — R1312 Dysphagia, oropharyngeal phase: Secondary | ICD-10-CM | POA: Diagnosis not present

## 2020-12-18 DIAGNOSIS — I69353 Hemiplegia and hemiparesis following cerebral infarction affecting right non-dominant side: Secondary | ICD-10-CM | POA: Diagnosis not present

## 2020-12-18 DIAGNOSIS — M6259 Muscle wasting and atrophy, not elsewhere classified, multiple sites: Secondary | ICD-10-CM | POA: Diagnosis not present

## 2020-12-18 DIAGNOSIS — I10 Essential (primary) hypertension: Secondary | ICD-10-CM | POA: Diagnosis not present

## 2020-12-22 DIAGNOSIS — R1312 Dysphagia, oropharyngeal phase: Secondary | ICD-10-CM | POA: Diagnosis not present

## 2020-12-22 DIAGNOSIS — I69353 Hemiplegia and hemiparesis following cerebral infarction affecting right non-dominant side: Secondary | ICD-10-CM | POA: Diagnosis not present

## 2020-12-22 DIAGNOSIS — E119 Type 2 diabetes mellitus without complications: Secondary | ICD-10-CM | POA: Diagnosis not present

## 2020-12-22 DIAGNOSIS — R278 Other lack of coordination: Secondary | ICD-10-CM | POA: Diagnosis not present

## 2020-12-22 DIAGNOSIS — M6259 Muscle wasting and atrophy, not elsewhere classified, multiple sites: Secondary | ICD-10-CM | POA: Diagnosis not present

## 2020-12-22 DIAGNOSIS — R2689 Other abnormalities of gait and mobility: Secondary | ICD-10-CM | POA: Diagnosis not present

## 2020-12-22 DIAGNOSIS — J1282 Pneumonia due to coronavirus disease 2019: Secondary | ICD-10-CM | POA: Diagnosis not present

## 2020-12-22 DIAGNOSIS — I10 Essential (primary) hypertension: Secondary | ICD-10-CM | POA: Diagnosis not present

## 2020-12-22 DIAGNOSIS — N186 End stage renal disease: Secondary | ICD-10-CM | POA: Diagnosis not present

## 2020-12-22 DIAGNOSIS — R2681 Unsteadiness on feet: Secondary | ICD-10-CM | POA: Diagnosis not present

## 2020-12-22 DIAGNOSIS — M6281 Muscle weakness (generalized): Secondary | ICD-10-CM | POA: Diagnosis not present

## 2020-12-22 DIAGNOSIS — R262 Difficulty in walking, not elsewhere classified: Secondary | ICD-10-CM | POA: Diagnosis not present

## 2020-12-22 DIAGNOSIS — R4781 Slurred speech: Secondary | ICD-10-CM | POA: Diagnosis not present

## 2020-12-23 DIAGNOSIS — J1282 Pneumonia due to coronavirus disease 2019: Secondary | ICD-10-CM | POA: Diagnosis not present

## 2020-12-23 DIAGNOSIS — N186 End stage renal disease: Secondary | ICD-10-CM | POA: Diagnosis not present

## 2020-12-23 DIAGNOSIS — R1312 Dysphagia, oropharyngeal phase: Secondary | ICD-10-CM | POA: Diagnosis not present

## 2020-12-23 DIAGNOSIS — R278 Other lack of coordination: Secondary | ICD-10-CM | POA: Diagnosis not present

## 2020-12-23 DIAGNOSIS — I10 Essential (primary) hypertension: Secondary | ICD-10-CM | POA: Diagnosis not present

## 2020-12-23 DIAGNOSIS — R4781 Slurred speech: Secondary | ICD-10-CM | POA: Diagnosis not present

## 2020-12-23 DIAGNOSIS — M6281 Muscle weakness (generalized): Secondary | ICD-10-CM | POA: Diagnosis not present

## 2020-12-23 DIAGNOSIS — R2681 Unsteadiness on feet: Secondary | ICD-10-CM | POA: Diagnosis not present

## 2020-12-23 DIAGNOSIS — E119 Type 2 diabetes mellitus without complications: Secondary | ICD-10-CM | POA: Diagnosis not present

## 2020-12-23 DIAGNOSIS — R2689 Other abnormalities of gait and mobility: Secondary | ICD-10-CM | POA: Diagnosis not present

## 2020-12-23 DIAGNOSIS — I69353 Hemiplegia and hemiparesis following cerebral infarction affecting right non-dominant side: Secondary | ICD-10-CM | POA: Diagnosis not present

## 2020-12-23 DIAGNOSIS — R262 Difficulty in walking, not elsewhere classified: Secondary | ICD-10-CM | POA: Diagnosis not present

## 2020-12-23 DIAGNOSIS — M6259 Muscle wasting and atrophy, not elsewhere classified, multiple sites: Secondary | ICD-10-CM | POA: Diagnosis not present

## 2020-12-24 DIAGNOSIS — R1312 Dysphagia, oropharyngeal phase: Secondary | ICD-10-CM | POA: Diagnosis not present

## 2020-12-24 DIAGNOSIS — N186 End stage renal disease: Secondary | ICD-10-CM | POA: Diagnosis not present

## 2020-12-24 DIAGNOSIS — R4781 Slurred speech: Secondary | ICD-10-CM | POA: Diagnosis not present

## 2020-12-24 DIAGNOSIS — M6259 Muscle wasting and atrophy, not elsewhere classified, multiple sites: Secondary | ICD-10-CM | POA: Diagnosis not present

## 2020-12-24 DIAGNOSIS — M6281 Muscle weakness (generalized): Secondary | ICD-10-CM | POA: Diagnosis not present

## 2020-12-24 DIAGNOSIS — R278 Other lack of coordination: Secondary | ICD-10-CM | POA: Diagnosis not present

## 2020-12-24 DIAGNOSIS — I69353 Hemiplegia and hemiparesis following cerebral infarction affecting right non-dominant side: Secondary | ICD-10-CM | POA: Diagnosis not present

## 2020-12-24 DIAGNOSIS — R2689 Other abnormalities of gait and mobility: Secondary | ICD-10-CM | POA: Diagnosis not present

## 2020-12-24 DIAGNOSIS — R262 Difficulty in walking, not elsewhere classified: Secondary | ICD-10-CM | POA: Diagnosis not present

## 2020-12-24 DIAGNOSIS — J1282 Pneumonia due to coronavirus disease 2019: Secondary | ICD-10-CM | POA: Diagnosis not present

## 2020-12-24 DIAGNOSIS — I10 Essential (primary) hypertension: Secondary | ICD-10-CM | POA: Diagnosis not present

## 2020-12-24 DIAGNOSIS — R2681 Unsteadiness on feet: Secondary | ICD-10-CM | POA: Diagnosis not present

## 2020-12-24 DIAGNOSIS — E119 Type 2 diabetes mellitus without complications: Secondary | ICD-10-CM | POA: Diagnosis not present

## 2020-12-25 DIAGNOSIS — M6281 Muscle weakness (generalized): Secondary | ICD-10-CM | POA: Diagnosis not present

## 2020-12-25 DIAGNOSIS — R4781 Slurred speech: Secondary | ICD-10-CM | POA: Diagnosis not present

## 2020-12-25 DIAGNOSIS — R1312 Dysphagia, oropharyngeal phase: Secondary | ICD-10-CM | POA: Diagnosis not present

## 2020-12-25 DIAGNOSIS — E119 Type 2 diabetes mellitus without complications: Secondary | ICD-10-CM | POA: Diagnosis not present

## 2020-12-25 DIAGNOSIS — M6259 Muscle wasting and atrophy, not elsewhere classified, multiple sites: Secondary | ICD-10-CM | POA: Diagnosis not present

## 2020-12-25 DIAGNOSIS — J1282 Pneumonia due to coronavirus disease 2019: Secondary | ICD-10-CM | POA: Diagnosis not present

## 2020-12-25 DIAGNOSIS — R2681 Unsteadiness on feet: Secondary | ICD-10-CM | POA: Diagnosis not present

## 2020-12-25 DIAGNOSIS — I69353 Hemiplegia and hemiparesis following cerebral infarction affecting right non-dominant side: Secondary | ICD-10-CM | POA: Diagnosis not present

## 2020-12-25 DIAGNOSIS — R278 Other lack of coordination: Secondary | ICD-10-CM | POA: Diagnosis not present

## 2020-12-25 DIAGNOSIS — N186 End stage renal disease: Secondary | ICD-10-CM | POA: Diagnosis not present

## 2020-12-25 DIAGNOSIS — R262 Difficulty in walking, not elsewhere classified: Secondary | ICD-10-CM | POA: Diagnosis not present

## 2020-12-25 DIAGNOSIS — R2689 Other abnormalities of gait and mobility: Secondary | ICD-10-CM | POA: Diagnosis not present

## 2020-12-25 DIAGNOSIS — I10 Essential (primary) hypertension: Secondary | ICD-10-CM | POA: Diagnosis not present

## 2020-12-26 DIAGNOSIS — R262 Difficulty in walking, not elsewhere classified: Secondary | ICD-10-CM | POA: Diagnosis not present

## 2020-12-26 DIAGNOSIS — I10 Essential (primary) hypertension: Secondary | ICD-10-CM | POA: Diagnosis not present

## 2020-12-26 DIAGNOSIS — I69353 Hemiplegia and hemiparesis following cerebral infarction affecting right non-dominant side: Secondary | ICD-10-CM | POA: Diagnosis not present

## 2020-12-26 DIAGNOSIS — M6259 Muscle wasting and atrophy, not elsewhere classified, multiple sites: Secondary | ICD-10-CM | POA: Diagnosis not present

## 2020-12-26 DIAGNOSIS — M6281 Muscle weakness (generalized): Secondary | ICD-10-CM | POA: Diagnosis not present

## 2020-12-26 DIAGNOSIS — R1312 Dysphagia, oropharyngeal phase: Secondary | ICD-10-CM | POA: Diagnosis not present

## 2020-12-26 DIAGNOSIS — R4781 Slurred speech: Secondary | ICD-10-CM | POA: Diagnosis not present

## 2020-12-26 DIAGNOSIS — R2681 Unsteadiness on feet: Secondary | ICD-10-CM | POA: Diagnosis not present

## 2020-12-26 DIAGNOSIS — N186 End stage renal disease: Secondary | ICD-10-CM | POA: Diagnosis not present

## 2020-12-26 DIAGNOSIS — E119 Type 2 diabetes mellitus without complications: Secondary | ICD-10-CM | POA: Diagnosis not present

## 2020-12-26 DIAGNOSIS — R278 Other lack of coordination: Secondary | ICD-10-CM | POA: Diagnosis not present

## 2020-12-26 DIAGNOSIS — R2689 Other abnormalities of gait and mobility: Secondary | ICD-10-CM | POA: Diagnosis not present

## 2020-12-26 DIAGNOSIS — J1282 Pneumonia due to coronavirus disease 2019: Secondary | ICD-10-CM | POA: Diagnosis not present

## 2020-12-30 DIAGNOSIS — R2681 Unsteadiness on feet: Secondary | ICD-10-CM | POA: Diagnosis not present

## 2020-12-30 DIAGNOSIS — R262 Difficulty in walking, not elsewhere classified: Secondary | ICD-10-CM | POA: Diagnosis not present

## 2020-12-30 DIAGNOSIS — M6281 Muscle weakness (generalized): Secondary | ICD-10-CM | POA: Diagnosis not present

## 2020-12-30 DIAGNOSIS — I10 Essential (primary) hypertension: Secondary | ICD-10-CM | POA: Diagnosis not present

## 2020-12-30 DIAGNOSIS — R2689 Other abnormalities of gait and mobility: Secondary | ICD-10-CM | POA: Diagnosis not present

## 2020-12-30 DIAGNOSIS — N186 End stage renal disease: Secondary | ICD-10-CM | POA: Diagnosis not present

## 2020-12-30 DIAGNOSIS — I69353 Hemiplegia and hemiparesis following cerebral infarction affecting right non-dominant side: Secondary | ICD-10-CM | POA: Diagnosis not present

## 2020-12-30 DIAGNOSIS — R1312 Dysphagia, oropharyngeal phase: Secondary | ICD-10-CM | POA: Diagnosis not present

## 2020-12-30 DIAGNOSIS — R4781 Slurred speech: Secondary | ICD-10-CM | POA: Diagnosis not present

## 2020-12-30 DIAGNOSIS — E119 Type 2 diabetes mellitus without complications: Secondary | ICD-10-CM | POA: Diagnosis not present

## 2020-12-30 DIAGNOSIS — J1282 Pneumonia due to coronavirus disease 2019: Secondary | ICD-10-CM | POA: Diagnosis not present

## 2020-12-30 DIAGNOSIS — R278 Other lack of coordination: Secondary | ICD-10-CM | POA: Diagnosis not present

## 2020-12-30 DIAGNOSIS — M6259 Muscle wasting and atrophy, not elsewhere classified, multiple sites: Secondary | ICD-10-CM | POA: Diagnosis not present

## 2020-12-31 DIAGNOSIS — N186 End stage renal disease: Secondary | ICD-10-CM | POA: Diagnosis not present

## 2020-12-31 DIAGNOSIS — R4781 Slurred speech: Secondary | ICD-10-CM | POA: Diagnosis not present

## 2020-12-31 DIAGNOSIS — R262 Difficulty in walking, not elsewhere classified: Secondary | ICD-10-CM | POA: Diagnosis not present

## 2020-12-31 DIAGNOSIS — M6281 Muscle weakness (generalized): Secondary | ICD-10-CM | POA: Diagnosis not present

## 2020-12-31 DIAGNOSIS — R2681 Unsteadiness on feet: Secondary | ICD-10-CM | POA: Diagnosis not present

## 2020-12-31 DIAGNOSIS — I69353 Hemiplegia and hemiparesis following cerebral infarction affecting right non-dominant side: Secondary | ICD-10-CM | POA: Diagnosis not present

## 2020-12-31 DIAGNOSIS — R278 Other lack of coordination: Secondary | ICD-10-CM | POA: Diagnosis not present

## 2020-12-31 DIAGNOSIS — R2689 Other abnormalities of gait and mobility: Secondary | ICD-10-CM | POA: Diagnosis not present

## 2020-12-31 DIAGNOSIS — J1282 Pneumonia due to coronavirus disease 2019: Secondary | ICD-10-CM | POA: Diagnosis not present

## 2020-12-31 DIAGNOSIS — M6259 Muscle wasting and atrophy, not elsewhere classified, multiple sites: Secondary | ICD-10-CM | POA: Diagnosis not present

## 2020-12-31 DIAGNOSIS — I10 Essential (primary) hypertension: Secondary | ICD-10-CM | POA: Diagnosis not present

## 2020-12-31 DIAGNOSIS — R1312 Dysphagia, oropharyngeal phase: Secondary | ICD-10-CM | POA: Diagnosis not present

## 2020-12-31 DIAGNOSIS — E119 Type 2 diabetes mellitus without complications: Secondary | ICD-10-CM | POA: Diagnosis not present

## 2021-01-02 DIAGNOSIS — R2689 Other abnormalities of gait and mobility: Secondary | ICD-10-CM | POA: Diagnosis not present

## 2021-01-02 DIAGNOSIS — R262 Difficulty in walking, not elsewhere classified: Secondary | ICD-10-CM | POA: Diagnosis not present

## 2021-01-02 DIAGNOSIS — M6259 Muscle wasting and atrophy, not elsewhere classified, multiple sites: Secondary | ICD-10-CM | POA: Diagnosis not present

## 2021-01-02 DIAGNOSIS — R1312 Dysphagia, oropharyngeal phase: Secondary | ICD-10-CM | POA: Diagnosis not present

## 2021-01-02 DIAGNOSIS — R278 Other lack of coordination: Secondary | ICD-10-CM | POA: Diagnosis not present

## 2021-01-02 DIAGNOSIS — N186 End stage renal disease: Secondary | ICD-10-CM | POA: Diagnosis not present

## 2021-01-02 DIAGNOSIS — R2681 Unsteadiness on feet: Secondary | ICD-10-CM | POA: Diagnosis not present

## 2021-01-02 DIAGNOSIS — R4781 Slurred speech: Secondary | ICD-10-CM | POA: Diagnosis not present

## 2021-01-02 DIAGNOSIS — E1122 Type 2 diabetes mellitus with diabetic chronic kidney disease: Secondary | ICD-10-CM | POA: Diagnosis not present

## 2021-01-02 DIAGNOSIS — J1282 Pneumonia due to coronavirus disease 2019: Secondary | ICD-10-CM | POA: Diagnosis not present

## 2021-01-02 DIAGNOSIS — I69353 Hemiplegia and hemiparesis following cerebral infarction affecting right non-dominant side: Secondary | ICD-10-CM | POA: Diagnosis not present

## 2021-01-02 DIAGNOSIS — M1990 Unspecified osteoarthritis, unspecified site: Secondary | ICD-10-CM | POA: Diagnosis not present

## 2021-01-02 DIAGNOSIS — E119 Type 2 diabetes mellitus without complications: Secondary | ICD-10-CM | POA: Diagnosis not present

## 2021-01-02 DIAGNOSIS — M6281 Muscle weakness (generalized): Secondary | ICD-10-CM | POA: Diagnosis not present

## 2021-01-02 DIAGNOSIS — I10 Essential (primary) hypertension: Secondary | ICD-10-CM | POA: Diagnosis not present

## 2021-01-05 DIAGNOSIS — E119 Type 2 diabetes mellitus without complications: Secondary | ICD-10-CM | POA: Diagnosis not present

## 2021-01-05 DIAGNOSIS — R2689 Other abnormalities of gait and mobility: Secondary | ICD-10-CM | POA: Diagnosis not present

## 2021-01-05 DIAGNOSIS — R4781 Slurred speech: Secondary | ICD-10-CM | POA: Diagnosis not present

## 2021-01-05 DIAGNOSIS — I69353 Hemiplegia and hemiparesis following cerebral infarction affecting right non-dominant side: Secondary | ICD-10-CM | POA: Diagnosis not present

## 2021-01-05 DIAGNOSIS — M6281 Muscle weakness (generalized): Secondary | ICD-10-CM | POA: Diagnosis not present

## 2021-01-05 DIAGNOSIS — N186 End stage renal disease: Secondary | ICD-10-CM | POA: Diagnosis not present

## 2021-01-05 DIAGNOSIS — R262 Difficulty in walking, not elsewhere classified: Secondary | ICD-10-CM | POA: Diagnosis not present

## 2021-01-05 DIAGNOSIS — J1282 Pneumonia due to coronavirus disease 2019: Secondary | ICD-10-CM | POA: Diagnosis not present

## 2021-01-05 DIAGNOSIS — M6259 Muscle wasting and atrophy, not elsewhere classified, multiple sites: Secondary | ICD-10-CM | POA: Diagnosis not present

## 2021-01-05 DIAGNOSIS — R1312 Dysphagia, oropharyngeal phase: Secondary | ICD-10-CM | POA: Diagnosis not present

## 2021-01-05 DIAGNOSIS — I10 Essential (primary) hypertension: Secondary | ICD-10-CM | POA: Diagnosis not present

## 2021-01-05 DIAGNOSIS — R2681 Unsteadiness on feet: Secondary | ICD-10-CM | POA: Diagnosis not present

## 2021-01-05 DIAGNOSIS — R278 Other lack of coordination: Secondary | ICD-10-CM | POA: Diagnosis not present

## 2021-01-06 DIAGNOSIS — R4781 Slurred speech: Secondary | ICD-10-CM | POA: Diagnosis not present

## 2021-01-06 DIAGNOSIS — N186 End stage renal disease: Secondary | ICD-10-CM | POA: Diagnosis not present

## 2021-01-06 DIAGNOSIS — I10 Essential (primary) hypertension: Secondary | ICD-10-CM | POA: Diagnosis not present

## 2021-01-06 DIAGNOSIS — R2681 Unsteadiness on feet: Secondary | ICD-10-CM | POA: Diagnosis not present

## 2021-01-06 DIAGNOSIS — M6259 Muscle wasting and atrophy, not elsewhere classified, multiple sites: Secondary | ICD-10-CM | POA: Diagnosis not present

## 2021-01-06 DIAGNOSIS — R262 Difficulty in walking, not elsewhere classified: Secondary | ICD-10-CM | POA: Diagnosis not present

## 2021-01-06 DIAGNOSIS — R278 Other lack of coordination: Secondary | ICD-10-CM | POA: Diagnosis not present

## 2021-01-06 DIAGNOSIS — R2689 Other abnormalities of gait and mobility: Secondary | ICD-10-CM | POA: Diagnosis not present

## 2021-01-06 DIAGNOSIS — I69353 Hemiplegia and hemiparesis following cerebral infarction affecting right non-dominant side: Secondary | ICD-10-CM | POA: Diagnosis not present

## 2021-01-06 DIAGNOSIS — E119 Type 2 diabetes mellitus without complications: Secondary | ICD-10-CM | POA: Diagnosis not present

## 2021-01-06 DIAGNOSIS — J1282 Pneumonia due to coronavirus disease 2019: Secondary | ICD-10-CM | POA: Diagnosis not present

## 2021-01-06 DIAGNOSIS — M6281 Muscle weakness (generalized): Secondary | ICD-10-CM | POA: Diagnosis not present

## 2021-01-06 DIAGNOSIS — R1312 Dysphagia, oropharyngeal phase: Secondary | ICD-10-CM | POA: Diagnosis not present

## 2021-01-07 DIAGNOSIS — R2681 Unsteadiness on feet: Secondary | ICD-10-CM | POA: Diagnosis not present

## 2021-01-07 DIAGNOSIS — R262 Difficulty in walking, not elsewhere classified: Secondary | ICD-10-CM | POA: Diagnosis not present

## 2021-01-07 DIAGNOSIS — N186 End stage renal disease: Secondary | ICD-10-CM | POA: Diagnosis not present

## 2021-01-07 DIAGNOSIS — I10 Essential (primary) hypertension: Secondary | ICD-10-CM | POA: Diagnosis not present

## 2021-01-07 DIAGNOSIS — M6259 Muscle wasting and atrophy, not elsewhere classified, multiple sites: Secondary | ICD-10-CM | POA: Diagnosis not present

## 2021-01-07 DIAGNOSIS — M6281 Muscle weakness (generalized): Secondary | ICD-10-CM | POA: Diagnosis not present

## 2021-01-07 DIAGNOSIS — R2689 Other abnormalities of gait and mobility: Secondary | ICD-10-CM | POA: Diagnosis not present

## 2021-01-07 DIAGNOSIS — R4781 Slurred speech: Secondary | ICD-10-CM | POA: Diagnosis not present

## 2021-01-07 DIAGNOSIS — R1312 Dysphagia, oropharyngeal phase: Secondary | ICD-10-CM | POA: Diagnosis not present

## 2021-01-07 DIAGNOSIS — E119 Type 2 diabetes mellitus without complications: Secondary | ICD-10-CM | POA: Diagnosis not present

## 2021-01-07 DIAGNOSIS — I69353 Hemiplegia and hemiparesis following cerebral infarction affecting right non-dominant side: Secondary | ICD-10-CM | POA: Diagnosis not present

## 2021-01-07 DIAGNOSIS — R278 Other lack of coordination: Secondary | ICD-10-CM | POA: Diagnosis not present

## 2021-01-07 DIAGNOSIS — J1282 Pneumonia due to coronavirus disease 2019: Secondary | ICD-10-CM | POA: Diagnosis not present

## 2021-01-08 DIAGNOSIS — M6281 Muscle weakness (generalized): Secondary | ICD-10-CM | POA: Diagnosis not present

## 2021-01-08 DIAGNOSIS — E119 Type 2 diabetes mellitus without complications: Secondary | ICD-10-CM | POA: Diagnosis not present

## 2021-01-08 DIAGNOSIS — R2689 Other abnormalities of gait and mobility: Secondary | ICD-10-CM | POA: Diagnosis not present

## 2021-01-08 DIAGNOSIS — R4781 Slurred speech: Secondary | ICD-10-CM | POA: Diagnosis not present

## 2021-01-08 DIAGNOSIS — N186 End stage renal disease: Secondary | ICD-10-CM | POA: Diagnosis not present

## 2021-01-08 DIAGNOSIS — J1282 Pneumonia due to coronavirus disease 2019: Secondary | ICD-10-CM | POA: Diagnosis not present

## 2021-01-08 DIAGNOSIS — I69353 Hemiplegia and hemiparesis following cerebral infarction affecting right non-dominant side: Secondary | ICD-10-CM | POA: Diagnosis not present

## 2021-01-08 DIAGNOSIS — I10 Essential (primary) hypertension: Secondary | ICD-10-CM | POA: Diagnosis not present

## 2021-01-08 DIAGNOSIS — R262 Difficulty in walking, not elsewhere classified: Secondary | ICD-10-CM | POA: Diagnosis not present

## 2021-01-08 DIAGNOSIS — M6259 Muscle wasting and atrophy, not elsewhere classified, multiple sites: Secondary | ICD-10-CM | POA: Diagnosis not present

## 2021-01-08 DIAGNOSIS — R278 Other lack of coordination: Secondary | ICD-10-CM | POA: Diagnosis not present

## 2021-01-08 DIAGNOSIS — R1312 Dysphagia, oropharyngeal phase: Secondary | ICD-10-CM | POA: Diagnosis not present

## 2021-01-08 DIAGNOSIS — R2681 Unsteadiness on feet: Secondary | ICD-10-CM | POA: Diagnosis not present

## 2021-01-10 DIAGNOSIS — D649 Anemia, unspecified: Secondary | ICD-10-CM | POA: Diagnosis not present

## 2021-01-10 DIAGNOSIS — Z940 Kidney transplant status: Secondary | ICD-10-CM | POA: Diagnosis not present

## 2021-01-12 DIAGNOSIS — R4781 Slurred speech: Secondary | ICD-10-CM | POA: Diagnosis not present

## 2021-01-12 DIAGNOSIS — R2681 Unsteadiness on feet: Secondary | ICD-10-CM | POA: Diagnosis not present

## 2021-01-12 DIAGNOSIS — J1282 Pneumonia due to coronavirus disease 2019: Secondary | ICD-10-CM | POA: Diagnosis not present

## 2021-01-12 DIAGNOSIS — R278 Other lack of coordination: Secondary | ICD-10-CM | POA: Diagnosis not present

## 2021-01-12 DIAGNOSIS — I69353 Hemiplegia and hemiparesis following cerebral infarction affecting right non-dominant side: Secondary | ICD-10-CM | POA: Diagnosis not present

## 2021-01-12 DIAGNOSIS — M6259 Muscle wasting and atrophy, not elsewhere classified, multiple sites: Secondary | ICD-10-CM | POA: Diagnosis not present

## 2021-01-12 DIAGNOSIS — E119 Type 2 diabetes mellitus without complications: Secondary | ICD-10-CM | POA: Diagnosis not present

## 2021-01-12 DIAGNOSIS — N186 End stage renal disease: Secondary | ICD-10-CM | POA: Diagnosis not present

## 2021-01-12 DIAGNOSIS — I10 Essential (primary) hypertension: Secondary | ICD-10-CM | POA: Diagnosis not present

## 2021-01-12 DIAGNOSIS — R2689 Other abnormalities of gait and mobility: Secondary | ICD-10-CM | POA: Diagnosis not present

## 2021-01-12 DIAGNOSIS — M6281 Muscle weakness (generalized): Secondary | ICD-10-CM | POA: Diagnosis not present

## 2021-01-12 DIAGNOSIS — R262 Difficulty in walking, not elsewhere classified: Secondary | ICD-10-CM | POA: Diagnosis not present

## 2021-01-12 DIAGNOSIS — R1312 Dysphagia, oropharyngeal phase: Secondary | ICD-10-CM | POA: Diagnosis not present

## 2021-01-13 DIAGNOSIS — R2689 Other abnormalities of gait and mobility: Secondary | ICD-10-CM | POA: Diagnosis not present

## 2021-01-13 DIAGNOSIS — R278 Other lack of coordination: Secondary | ICD-10-CM | POA: Diagnosis not present

## 2021-01-13 DIAGNOSIS — J1282 Pneumonia due to coronavirus disease 2019: Secondary | ICD-10-CM | POA: Diagnosis not present

## 2021-01-13 DIAGNOSIS — R262 Difficulty in walking, not elsewhere classified: Secondary | ICD-10-CM | POA: Diagnosis not present

## 2021-01-13 DIAGNOSIS — E119 Type 2 diabetes mellitus without complications: Secondary | ICD-10-CM | POA: Diagnosis not present

## 2021-01-13 DIAGNOSIS — R4781 Slurred speech: Secondary | ICD-10-CM | POA: Diagnosis not present

## 2021-01-13 DIAGNOSIS — D6861 Antiphospholipid syndrome: Secondary | ICD-10-CM | POA: Diagnosis not present

## 2021-01-13 DIAGNOSIS — M6281 Muscle weakness (generalized): Secondary | ICD-10-CM | POA: Diagnosis not present

## 2021-01-13 DIAGNOSIS — R2681 Unsteadiness on feet: Secondary | ICD-10-CM | POA: Diagnosis not present

## 2021-01-13 DIAGNOSIS — N186 End stage renal disease: Secondary | ICD-10-CM | POA: Diagnosis not present

## 2021-01-13 DIAGNOSIS — R1312 Dysphagia, oropharyngeal phase: Secondary | ICD-10-CM | POA: Diagnosis not present

## 2021-01-13 DIAGNOSIS — I10 Essential (primary) hypertension: Secondary | ICD-10-CM | POA: Diagnosis not present

## 2021-01-13 DIAGNOSIS — I69353 Hemiplegia and hemiparesis following cerebral infarction affecting right non-dominant side: Secondary | ICD-10-CM | POA: Diagnosis not present

## 2021-01-13 DIAGNOSIS — M6259 Muscle wasting and atrophy, not elsewhere classified, multiple sites: Secondary | ICD-10-CM | POA: Diagnosis not present

## 2021-01-14 DIAGNOSIS — I69353 Hemiplegia and hemiparesis following cerebral infarction affecting right non-dominant side: Secondary | ICD-10-CM | POA: Diagnosis not present

## 2021-01-14 DIAGNOSIS — R2689 Other abnormalities of gait and mobility: Secondary | ICD-10-CM | POA: Diagnosis not present

## 2021-01-14 DIAGNOSIS — R2681 Unsteadiness on feet: Secondary | ICD-10-CM | POA: Diagnosis not present

## 2021-01-14 DIAGNOSIS — R278 Other lack of coordination: Secondary | ICD-10-CM | POA: Diagnosis not present

## 2021-01-14 DIAGNOSIS — M6259 Muscle wasting and atrophy, not elsewhere classified, multiple sites: Secondary | ICD-10-CM | POA: Diagnosis not present

## 2021-01-14 DIAGNOSIS — G894 Chronic pain syndrome: Secondary | ICD-10-CM | POA: Diagnosis not present

## 2021-01-14 DIAGNOSIS — I10 Essential (primary) hypertension: Secondary | ICD-10-CM | POA: Diagnosis not present

## 2021-01-14 DIAGNOSIS — E119 Type 2 diabetes mellitus without complications: Secondary | ICD-10-CM | POA: Diagnosis not present

## 2021-01-14 DIAGNOSIS — R1312 Dysphagia, oropharyngeal phase: Secondary | ICD-10-CM | POA: Diagnosis not present

## 2021-01-14 DIAGNOSIS — R4781 Slurred speech: Secondary | ICD-10-CM | POA: Diagnosis not present

## 2021-01-14 DIAGNOSIS — R262 Difficulty in walking, not elsewhere classified: Secondary | ICD-10-CM | POA: Diagnosis not present

## 2021-01-14 DIAGNOSIS — M6281 Muscle weakness (generalized): Secondary | ICD-10-CM | POA: Diagnosis not present

## 2021-01-14 DIAGNOSIS — J1282 Pneumonia due to coronavirus disease 2019: Secondary | ICD-10-CM | POA: Diagnosis not present

## 2021-01-14 DIAGNOSIS — N186 End stage renal disease: Secondary | ICD-10-CM | POA: Diagnosis not present

## 2021-01-15 DIAGNOSIS — J1282 Pneumonia due to coronavirus disease 2019: Secondary | ICD-10-CM | POA: Diagnosis not present

## 2021-01-15 DIAGNOSIS — R278 Other lack of coordination: Secondary | ICD-10-CM | POA: Diagnosis not present

## 2021-01-15 DIAGNOSIS — M6281 Muscle weakness (generalized): Secondary | ICD-10-CM | POA: Diagnosis not present

## 2021-01-15 DIAGNOSIS — R4781 Slurred speech: Secondary | ICD-10-CM | POA: Diagnosis not present

## 2021-01-15 DIAGNOSIS — I10 Essential (primary) hypertension: Secondary | ICD-10-CM | POA: Diagnosis not present

## 2021-01-15 DIAGNOSIS — I69353 Hemiplegia and hemiparesis following cerebral infarction affecting right non-dominant side: Secondary | ICD-10-CM | POA: Diagnosis not present

## 2021-01-15 DIAGNOSIS — E119 Type 2 diabetes mellitus without complications: Secondary | ICD-10-CM | POA: Diagnosis not present

## 2021-01-15 DIAGNOSIS — M6259 Muscle wasting and atrophy, not elsewhere classified, multiple sites: Secondary | ICD-10-CM | POA: Diagnosis not present

## 2021-01-15 DIAGNOSIS — R262 Difficulty in walking, not elsewhere classified: Secondary | ICD-10-CM | POA: Diagnosis not present

## 2021-01-15 DIAGNOSIS — R2689 Other abnormalities of gait and mobility: Secondary | ICD-10-CM | POA: Diagnosis not present

## 2021-01-15 DIAGNOSIS — R2681 Unsteadiness on feet: Secondary | ICD-10-CM | POA: Diagnosis not present

## 2021-01-15 DIAGNOSIS — R1312 Dysphagia, oropharyngeal phase: Secondary | ICD-10-CM | POA: Diagnosis not present

## 2021-01-15 DIAGNOSIS — N186 End stage renal disease: Secondary | ICD-10-CM | POA: Diagnosis not present

## 2021-01-16 DIAGNOSIS — G894 Chronic pain syndrome: Secondary | ICD-10-CM | POA: Diagnosis not present

## 2021-01-16 DIAGNOSIS — N186 End stage renal disease: Secondary | ICD-10-CM | POA: Diagnosis not present

## 2021-01-16 DIAGNOSIS — Z86718 Personal history of other venous thrombosis and embolism: Secondary | ICD-10-CM | POA: Diagnosis not present

## 2021-01-16 DIAGNOSIS — E1142 Type 2 diabetes mellitus with diabetic polyneuropathy: Secondary | ICD-10-CM | POA: Diagnosis not present

## 2021-01-16 DIAGNOSIS — E1122 Type 2 diabetes mellitus with diabetic chronic kidney disease: Secondary | ICD-10-CM | POA: Diagnosis not present

## 2021-01-16 DIAGNOSIS — M328 Other forms of systemic lupus erythematosus: Secondary | ICD-10-CM | POA: Diagnosis not present

## 2021-01-16 DIAGNOSIS — I69351 Hemiplegia and hemiparesis following cerebral infarction affecting right dominant side: Secondary | ICD-10-CM | POA: Diagnosis not present

## 2021-01-16 DIAGNOSIS — M6281 Muscle weakness (generalized): Secondary | ICD-10-CM | POA: Diagnosis not present

## 2021-01-16 DIAGNOSIS — D6861 Antiphospholipid syndrome: Secondary | ICD-10-CM | POA: Diagnosis not present

## 2021-01-16 DIAGNOSIS — E038 Other specified hypothyroidism: Secondary | ICD-10-CM | POA: Diagnosis not present

## 2021-01-16 DIAGNOSIS — I5032 Chronic diastolic (congestive) heart failure: Secondary | ICD-10-CM | POA: Diagnosis not present

## 2021-01-16 DIAGNOSIS — I10 Essential (primary) hypertension: Secondary | ICD-10-CM | POA: Diagnosis not present

## 2021-01-19 DIAGNOSIS — E119 Type 2 diabetes mellitus without complications: Secondary | ICD-10-CM | POA: Diagnosis not present

## 2021-01-19 DIAGNOSIS — J1282 Pneumonia due to coronavirus disease 2019: Secondary | ICD-10-CM | POA: Diagnosis not present

## 2021-01-19 DIAGNOSIS — M6281 Muscle weakness (generalized): Secondary | ICD-10-CM | POA: Diagnosis not present

## 2021-01-19 DIAGNOSIS — R1312 Dysphagia, oropharyngeal phase: Secondary | ICD-10-CM | POA: Diagnosis not present

## 2021-01-19 DIAGNOSIS — N186 End stage renal disease: Secondary | ICD-10-CM | POA: Diagnosis not present

## 2021-01-19 DIAGNOSIS — M6259 Muscle wasting and atrophy, not elsewhere classified, multiple sites: Secondary | ICD-10-CM | POA: Diagnosis not present

## 2021-01-19 DIAGNOSIS — I69353 Hemiplegia and hemiparesis following cerebral infarction affecting right non-dominant side: Secondary | ICD-10-CM | POA: Diagnosis not present

## 2021-01-19 DIAGNOSIS — R278 Other lack of coordination: Secondary | ICD-10-CM | POA: Diagnosis not present

## 2021-01-19 DIAGNOSIS — R4781 Slurred speech: Secondary | ICD-10-CM | POA: Diagnosis not present

## 2021-01-19 DIAGNOSIS — R262 Difficulty in walking, not elsewhere classified: Secondary | ICD-10-CM | POA: Diagnosis not present

## 2021-01-19 DIAGNOSIS — R2681 Unsteadiness on feet: Secondary | ICD-10-CM | POA: Diagnosis not present

## 2021-01-19 DIAGNOSIS — R2689 Other abnormalities of gait and mobility: Secondary | ICD-10-CM | POA: Diagnosis not present

## 2021-01-19 DIAGNOSIS — I10 Essential (primary) hypertension: Secondary | ICD-10-CM | POA: Diagnosis not present

## 2021-01-20 DIAGNOSIS — R278 Other lack of coordination: Secondary | ICD-10-CM | POA: Diagnosis not present

## 2021-01-20 DIAGNOSIS — Z7901 Long term (current) use of anticoagulants: Secondary | ICD-10-CM | POA: Diagnosis not present

## 2021-01-20 DIAGNOSIS — E038 Other specified hypothyroidism: Secondary | ICD-10-CM | POA: Diagnosis not present

## 2021-01-20 DIAGNOSIS — M6281 Muscle weakness (generalized): Secondary | ICD-10-CM | POA: Diagnosis not present

## 2021-01-20 DIAGNOSIS — M328 Other forms of systemic lupus erythematosus: Secondary | ICD-10-CM | POA: Diagnosis not present

## 2021-01-20 DIAGNOSIS — R2689 Other abnormalities of gait and mobility: Secondary | ICD-10-CM | POA: Diagnosis not present

## 2021-01-20 DIAGNOSIS — I69353 Hemiplegia and hemiparesis following cerebral infarction affecting right non-dominant side: Secondary | ICD-10-CM | POA: Diagnosis not present

## 2021-01-20 DIAGNOSIS — N186 End stage renal disease: Secondary | ICD-10-CM | POA: Diagnosis not present

## 2021-01-20 DIAGNOSIS — R262 Difficulty in walking, not elsewhere classified: Secondary | ICD-10-CM | POA: Diagnosis not present

## 2021-01-20 DIAGNOSIS — E119 Type 2 diabetes mellitus without complications: Secondary | ICD-10-CM | POA: Diagnosis not present

## 2021-01-20 DIAGNOSIS — G894 Chronic pain syndrome: Secondary | ICD-10-CM | POA: Diagnosis not present

## 2021-01-20 DIAGNOSIS — E1142 Type 2 diabetes mellitus with diabetic polyneuropathy: Secondary | ICD-10-CM | POA: Diagnosis not present

## 2021-01-20 DIAGNOSIS — M6259 Muscle wasting and atrophy, not elsewhere classified, multiple sites: Secondary | ICD-10-CM | POA: Diagnosis not present

## 2021-01-20 DIAGNOSIS — I10 Essential (primary) hypertension: Secondary | ICD-10-CM | POA: Diagnosis not present

## 2021-01-20 DIAGNOSIS — Z86718 Personal history of other venous thrombosis and embolism: Secondary | ICD-10-CM | POA: Diagnosis not present

## 2021-01-20 DIAGNOSIS — J1282 Pneumonia due to coronavirus disease 2019: Secondary | ICD-10-CM | POA: Diagnosis not present

## 2021-01-20 DIAGNOSIS — I5032 Chronic diastolic (congestive) heart failure: Secondary | ICD-10-CM | POA: Diagnosis not present

## 2021-01-20 DIAGNOSIS — R4781 Slurred speech: Secondary | ICD-10-CM | POA: Diagnosis not present

## 2021-01-20 DIAGNOSIS — D6861 Antiphospholipid syndrome: Secondary | ICD-10-CM | POA: Diagnosis not present

## 2021-01-20 DIAGNOSIS — R1312 Dysphagia, oropharyngeal phase: Secondary | ICD-10-CM | POA: Diagnosis not present

## 2021-01-20 DIAGNOSIS — I69351 Hemiplegia and hemiparesis following cerebral infarction affecting right dominant side: Secondary | ICD-10-CM | POA: Diagnosis not present

## 2021-01-20 DIAGNOSIS — R2681 Unsteadiness on feet: Secondary | ICD-10-CM | POA: Diagnosis not present

## 2021-01-21 DIAGNOSIS — J1282 Pneumonia due to coronavirus disease 2019: Secondary | ICD-10-CM | POA: Diagnosis not present

## 2021-01-21 DIAGNOSIS — R262 Difficulty in walking, not elsewhere classified: Secondary | ICD-10-CM | POA: Diagnosis not present

## 2021-01-21 DIAGNOSIS — R2689 Other abnormalities of gait and mobility: Secondary | ICD-10-CM | POA: Diagnosis not present

## 2021-01-21 DIAGNOSIS — I10 Essential (primary) hypertension: Secondary | ICD-10-CM | POA: Diagnosis not present

## 2021-01-21 DIAGNOSIS — M6259 Muscle wasting and atrophy, not elsewhere classified, multiple sites: Secondary | ICD-10-CM | POA: Diagnosis not present

## 2021-01-21 DIAGNOSIS — R4781 Slurred speech: Secondary | ICD-10-CM | POA: Diagnosis not present

## 2021-01-21 DIAGNOSIS — R278 Other lack of coordination: Secondary | ICD-10-CM | POA: Diagnosis not present

## 2021-01-21 DIAGNOSIS — R1312 Dysphagia, oropharyngeal phase: Secondary | ICD-10-CM | POA: Diagnosis not present

## 2021-01-21 DIAGNOSIS — M6281 Muscle weakness (generalized): Secondary | ICD-10-CM | POA: Diagnosis not present

## 2021-01-21 DIAGNOSIS — N186 End stage renal disease: Secondary | ICD-10-CM | POA: Diagnosis not present

## 2021-01-21 DIAGNOSIS — I69353 Hemiplegia and hemiparesis following cerebral infarction affecting right non-dominant side: Secondary | ICD-10-CM | POA: Diagnosis not present

## 2021-01-21 DIAGNOSIS — E119 Type 2 diabetes mellitus without complications: Secondary | ICD-10-CM | POA: Diagnosis not present

## 2021-01-21 DIAGNOSIS — R2681 Unsteadiness on feet: Secondary | ICD-10-CM | POA: Diagnosis not present

## 2021-01-22 DIAGNOSIS — M6281 Muscle weakness (generalized): Secondary | ICD-10-CM | POA: Diagnosis not present

## 2021-01-22 DIAGNOSIS — R262 Difficulty in walking, not elsewhere classified: Secondary | ICD-10-CM | POA: Diagnosis not present

## 2021-01-22 DIAGNOSIS — N186 End stage renal disease: Secondary | ICD-10-CM | POA: Diagnosis not present

## 2021-01-22 DIAGNOSIS — R2689 Other abnormalities of gait and mobility: Secondary | ICD-10-CM | POA: Diagnosis not present

## 2021-01-22 DIAGNOSIS — J1282 Pneumonia due to coronavirus disease 2019: Secondary | ICD-10-CM | POA: Diagnosis not present

## 2021-01-22 DIAGNOSIS — I69353 Hemiplegia and hemiparesis following cerebral infarction affecting right non-dominant side: Secondary | ICD-10-CM | POA: Diagnosis not present

## 2021-01-22 DIAGNOSIS — I10 Essential (primary) hypertension: Secondary | ICD-10-CM | POA: Diagnosis not present

## 2021-01-22 DIAGNOSIS — R2681 Unsteadiness on feet: Secondary | ICD-10-CM | POA: Diagnosis not present

## 2021-01-22 DIAGNOSIS — M6259 Muscle wasting and atrophy, not elsewhere classified, multiple sites: Secondary | ICD-10-CM | POA: Diagnosis not present

## 2021-01-22 DIAGNOSIS — E119 Type 2 diabetes mellitus without complications: Secondary | ICD-10-CM | POA: Diagnosis not present

## 2021-01-22 DIAGNOSIS — R4781 Slurred speech: Secondary | ICD-10-CM | POA: Diagnosis not present

## 2021-01-22 DIAGNOSIS — R278 Other lack of coordination: Secondary | ICD-10-CM | POA: Diagnosis not present

## 2021-01-22 DIAGNOSIS — R1312 Dysphagia, oropharyngeal phase: Secondary | ICD-10-CM | POA: Diagnosis not present

## 2021-01-23 DIAGNOSIS — I69353 Hemiplegia and hemiparesis following cerebral infarction affecting right non-dominant side: Secondary | ICD-10-CM | POA: Diagnosis not present

## 2021-01-23 DIAGNOSIS — R262 Difficulty in walking, not elsewhere classified: Secondary | ICD-10-CM | POA: Diagnosis not present

## 2021-01-23 DIAGNOSIS — R278 Other lack of coordination: Secondary | ICD-10-CM | POA: Diagnosis not present

## 2021-01-23 DIAGNOSIS — R2681 Unsteadiness on feet: Secondary | ICD-10-CM | POA: Diagnosis not present

## 2021-01-23 DIAGNOSIS — R4781 Slurred speech: Secondary | ICD-10-CM | POA: Diagnosis not present

## 2021-01-23 DIAGNOSIS — R1312 Dysphagia, oropharyngeal phase: Secondary | ICD-10-CM | POA: Diagnosis not present

## 2021-01-23 DIAGNOSIS — E119 Type 2 diabetes mellitus without complications: Secondary | ICD-10-CM | POA: Diagnosis not present

## 2021-01-23 DIAGNOSIS — R2689 Other abnormalities of gait and mobility: Secondary | ICD-10-CM | POA: Diagnosis not present

## 2021-01-23 DIAGNOSIS — M6281 Muscle weakness (generalized): Secondary | ICD-10-CM | POA: Diagnosis not present

## 2021-01-23 DIAGNOSIS — J1282 Pneumonia due to coronavirus disease 2019: Secondary | ICD-10-CM | POA: Diagnosis not present

## 2021-01-23 DIAGNOSIS — M6259 Muscle wasting and atrophy, not elsewhere classified, multiple sites: Secondary | ICD-10-CM | POA: Diagnosis not present

## 2021-01-23 DIAGNOSIS — N186 End stage renal disease: Secondary | ICD-10-CM | POA: Diagnosis not present

## 2021-01-23 DIAGNOSIS — I10 Essential (primary) hypertension: Secondary | ICD-10-CM | POA: Diagnosis not present

## 2021-01-26 ENCOUNTER — Other Ambulatory Visit: Payer: Self-pay

## 2021-01-26 ENCOUNTER — Emergency Department (HOSPITAL_COMMUNITY): Payer: Medicare Other

## 2021-01-26 ENCOUNTER — Encounter (HOSPITAL_COMMUNITY): Payer: Self-pay

## 2021-01-26 ENCOUNTER — Inpatient Hospital Stay (HOSPITAL_COMMUNITY)
Admission: EM | Admit: 2021-01-26 | Discharge: 2021-02-14 | DRG: 394 | Disposition: A | Discharge status: Skilled Nursing Facility | Payer: Medicare Other | Source: Skilled Nursing Facility | Attending: Internal Medicine | Admitting: Internal Medicine

## 2021-01-26 DIAGNOSIS — Z825 Family history of asthma and other chronic lower respiratory diseases: Secondary | ICD-10-CM

## 2021-01-26 DIAGNOSIS — Z8042 Family history of malignant neoplasm of prostate: Secondary | ICD-10-CM

## 2021-01-26 DIAGNOSIS — E1122 Type 2 diabetes mellitus with diabetic chronic kidney disease: Secondary | ICD-10-CM | POA: Diagnosis not present

## 2021-01-26 DIAGNOSIS — K521 Toxic gastroenteritis and colitis: Secondary | ICD-10-CM | POA: Diagnosis not present

## 2021-01-26 DIAGNOSIS — R262 Difficulty in walking, not elsewhere classified: Secondary | ICD-10-CM | POA: Diagnosis not present

## 2021-01-26 DIAGNOSIS — I499 Cardiac arrhythmia, unspecified: Secondary | ICD-10-CM | POA: Diagnosis not present

## 2021-01-26 DIAGNOSIS — E039 Hypothyroidism, unspecified: Secondary | ICD-10-CM | POA: Diagnosis present

## 2021-01-26 DIAGNOSIS — I10 Essential (primary) hypertension: Secondary | ICD-10-CM | POA: Diagnosis not present

## 2021-01-26 DIAGNOSIS — Z7901 Long term (current) use of anticoagulants: Secondary | ICD-10-CM

## 2021-01-26 DIAGNOSIS — E46 Unspecified protein-calorie malnutrition: Secondary | ICD-10-CM | POA: Diagnosis not present

## 2021-01-26 DIAGNOSIS — R4781 Slurred speech: Secondary | ICD-10-CM | POA: Diagnosis not present

## 2021-01-26 DIAGNOSIS — R2681 Unsteadiness on feet: Secondary | ICD-10-CM | POA: Diagnosis not present

## 2021-01-26 DIAGNOSIS — E119 Type 2 diabetes mellitus without complications: Secondary | ICD-10-CM | POA: Diagnosis not present

## 2021-01-26 DIAGNOSIS — T451X5A Adverse effect of antineoplastic and immunosuppressive drugs, initial encounter: Secondary | ICD-10-CM | POA: Diagnosis not present

## 2021-01-26 DIAGNOSIS — R011 Cardiac murmur, unspecified: Secondary | ICD-10-CM | POA: Diagnosis not present

## 2021-01-26 DIAGNOSIS — R278 Other lack of coordination: Secondary | ICD-10-CM | POA: Diagnosis not present

## 2021-01-26 DIAGNOSIS — Z794 Long term (current) use of insulin: Secondary | ICD-10-CM

## 2021-01-26 DIAGNOSIS — E860 Dehydration: Secondary | ICD-10-CM | POA: Diagnosis present

## 2021-01-26 DIAGNOSIS — Z885 Allergy status to narcotic agent status: Secondary | ICD-10-CM | POA: Diagnosis not present

## 2021-01-26 DIAGNOSIS — Z531 Procedure and treatment not carried out because of patient's decision for reasons of belief and group pressure: Secondary | ICD-10-CM | POA: Diagnosis not present

## 2021-01-26 DIAGNOSIS — Z86718 Personal history of other venous thrombosis and embolism: Secondary | ICD-10-CM

## 2021-01-26 DIAGNOSIS — Z7952 Long term (current) use of systemic steroids: Secondary | ICD-10-CM

## 2021-01-26 DIAGNOSIS — Z86711 Personal history of pulmonary embolism: Secondary | ICD-10-CM

## 2021-01-26 DIAGNOSIS — F32A Depression, unspecified: Secondary | ICD-10-CM | POA: Diagnosis present

## 2021-01-26 DIAGNOSIS — G4700 Insomnia, unspecified: Secondary | ICD-10-CM | POA: Diagnosis present

## 2021-01-26 DIAGNOSIS — R000 Tachycardia, unspecified: Secondary | ICD-10-CM | POA: Diagnosis present

## 2021-01-26 DIAGNOSIS — R1032 Left lower quadrant pain: Secondary | ICD-10-CM | POA: Diagnosis not present

## 2021-01-26 DIAGNOSIS — K219 Gastro-esophageal reflux disease without esophagitis: Secondary | ICD-10-CM | POA: Diagnosis present

## 2021-01-26 DIAGNOSIS — I6930 Unspecified sequelae of cerebral infarction: Secondary | ICD-10-CM | POA: Diagnosis not present

## 2021-01-26 DIAGNOSIS — Z940 Kidney transplant status: Secondary | ICD-10-CM

## 2021-01-26 DIAGNOSIS — Z6838 Body mass index (BMI) 38.0-38.9, adult: Secondary | ICD-10-CM

## 2021-01-26 DIAGNOSIS — K529 Noninfective gastroenteritis and colitis, unspecified: Secondary | ICD-10-CM

## 2021-01-26 DIAGNOSIS — R1312 Dysphagia, oropharyngeal phase: Secondary | ICD-10-CM | POA: Diagnosis not present

## 2021-01-26 DIAGNOSIS — Z79890 Hormone replacement therapy (postmenopausal): Secondary | ICD-10-CM

## 2021-01-26 DIAGNOSIS — Z7983 Long term (current) use of bisphosphonates: Secondary | ICD-10-CM

## 2021-01-26 DIAGNOSIS — Z20822 Contact with and (suspected) exposure to covid-19: Secondary | ICD-10-CM | POA: Diagnosis not present

## 2021-01-26 DIAGNOSIS — E1165 Type 2 diabetes mellitus with hyperglycemia: Secondary | ICD-10-CM | POA: Diagnosis present

## 2021-01-26 DIAGNOSIS — R29898 Other symptoms and signs involving the musculoskeletal system: Secondary | ICD-10-CM | POA: Diagnosis not present

## 2021-01-26 DIAGNOSIS — M329 Systemic lupus erythematosus, unspecified: Secondary | ICD-10-CM | POA: Diagnosis present

## 2021-01-26 DIAGNOSIS — I110 Hypertensive heart disease with heart failure: Secondary | ICD-10-CM | POA: Diagnosis present

## 2021-01-26 DIAGNOSIS — R2689 Other abnormalities of gait and mobility: Secondary | ICD-10-CM | POA: Diagnosis not present

## 2021-01-26 DIAGNOSIS — M6281 Muscle weakness (generalized): Secondary | ICD-10-CM | POA: Diagnosis not present

## 2021-01-26 DIAGNOSIS — A0472 Enterocolitis due to Clostridium difficile, not specified as recurrent: Secondary | ICD-10-CM | POA: Diagnosis present

## 2021-01-26 DIAGNOSIS — R1030 Lower abdominal pain, unspecified: Secondary | ICD-10-CM

## 2021-01-26 DIAGNOSIS — E785 Hyperlipidemia, unspecified: Secondary | ICD-10-CM | POA: Diagnosis present

## 2021-01-26 DIAGNOSIS — Z743 Need for continuous supervision: Secondary | ICD-10-CM | POA: Diagnosis not present

## 2021-01-26 DIAGNOSIS — I5032 Chronic diastolic (congestive) heart failure: Secondary | ICD-10-CM | POA: Diagnosis not present

## 2021-01-26 DIAGNOSIS — I69353 Hemiplegia and hemiparesis following cerebral infarction affecting right non-dominant side: Secondary | ICD-10-CM | POA: Diagnosis not present

## 2021-01-26 DIAGNOSIS — Z888 Allergy status to other drugs, medicaments and biological substances status: Secondary | ICD-10-CM

## 2021-01-26 DIAGNOSIS — R110 Nausea: Secondary | ICD-10-CM | POA: Diagnosis not present

## 2021-01-26 DIAGNOSIS — Z7984 Long term (current) use of oral hypoglycemic drugs: Secondary | ICD-10-CM

## 2021-01-26 DIAGNOSIS — Z8249 Family history of ischemic heart disease and other diseases of the circulatory system: Secondary | ICD-10-CM | POA: Diagnosis not present

## 2021-01-26 DIAGNOSIS — Z0189 Encounter for other specified special examinations: Secondary | ICD-10-CM

## 2021-01-26 DIAGNOSIS — R197 Diarrhea, unspecified: Secondary | ICD-10-CM | POA: Diagnosis not present

## 2021-01-26 DIAGNOSIS — M810 Age-related osteoporosis without current pathological fracture: Secondary | ICD-10-CM | POA: Diagnosis present

## 2021-01-26 DIAGNOSIS — R1031 Right lower quadrant pain: Secondary | ICD-10-CM | POA: Diagnosis not present

## 2021-01-26 DIAGNOSIS — Z91040 Latex allergy status: Secondary | ICD-10-CM

## 2021-01-26 DIAGNOSIS — E669 Obesity, unspecified: Secondary | ICD-10-CM | POA: Diagnosis present

## 2021-01-26 DIAGNOSIS — N186 End stage renal disease: Secondary | ICD-10-CM | POA: Diagnosis not present

## 2021-01-26 DIAGNOSIS — Z9049 Acquired absence of other specified parts of digestive tract: Secondary | ICD-10-CM

## 2021-01-26 DIAGNOSIS — R109 Unspecified abdominal pain: Secondary | ICD-10-CM

## 2021-01-26 DIAGNOSIS — Z800 Family history of malignant neoplasm of digestive organs: Secondary | ICD-10-CM

## 2021-01-26 DIAGNOSIS — E876 Hypokalemia: Secondary | ICD-10-CM | POA: Diagnosis not present

## 2021-01-26 DIAGNOSIS — M6259 Muscle wasting and atrophy, not elsewhere classified, multiple sites: Secondary | ICD-10-CM | POA: Diagnosis not present

## 2021-01-26 DIAGNOSIS — Z79899 Other long term (current) drug therapy: Secondary | ICD-10-CM

## 2021-01-26 DIAGNOSIS — M1990 Unspecified osteoarthritis, unspecified site: Secondary | ICD-10-CM | POA: Diagnosis not present

## 2021-01-26 DIAGNOSIS — F419 Anxiety disorder, unspecified: Secondary | ICD-10-CM | POA: Diagnosis present

## 2021-01-26 DIAGNOSIS — Y92129 Unspecified place in nursing home as the place of occurrence of the external cause: Secondary | ICD-10-CM

## 2021-01-26 DIAGNOSIS — I825Z9 Chronic embolism and thrombosis of unspecified deep veins of unspecified distal lower extremity: Secondary | ICD-10-CM | POA: Diagnosis not present

## 2021-01-26 DIAGNOSIS — I82409 Acute embolism and thrombosis of unspecified deep veins of unspecified lower extremity: Secondary | ICD-10-CM | POA: Diagnosis present

## 2021-01-26 DIAGNOSIS — J1282 Pneumonia due to coronavirus disease 2019: Secondary | ICD-10-CM | POA: Diagnosis not present

## 2021-01-26 LAB — URINALYSIS, ROUTINE W REFLEX MICROSCOPIC
Glucose, UA: NEGATIVE mg/dL
Hgb urine dipstick: NEGATIVE
Ketones, ur: 40 mg/dL — AB
Leukocytes,Ua: NEGATIVE
Nitrite: NEGATIVE
Specific Gravity, Urine: >1.03 — ABNORMAL HIGH (ref 1.005–1.030)
pH: 6 (ref 5.0–8.0)

## 2021-01-26 LAB — CBC WITH DIFFERENTIAL/PLATELET
Abs Immature Granulocytes: 0.03 10*3/uL (ref 0.00–0.07)
Basophils Absolute: 0.1 10*3/uL (ref 0.0–0.1)
Basophils Relative: 1 %
Eosinophils Absolute: 0 10*3/uL (ref 0.0–0.5)
Eosinophils Relative: 0 %
HCT: 46.6 % — ABNORMAL HIGH (ref 36.0–46.0)
Hemoglobin: 14.4 g/dL (ref 12.0–15.0)
Immature Granulocytes: 0 %
Lymphocytes Relative: 28 %
Lymphs Abs: 3.5 10*3/uL (ref 0.7–4.0)
MCH: 26.8 pg (ref 26.0–34.0)
MCHC: 30.9 g/dL (ref 30.0–36.0)
MCV: 86.8 fL (ref 80.0–100.0)
Monocytes Absolute: 0.6 10*3/uL (ref 0.1–1.0)
Monocytes Relative: 5 %
Neutro Abs: 8.3 10*3/uL — ABNORMAL HIGH (ref 1.7–7.7)
Neutrophils Relative %: 66 %
Platelets: 334 10*3/uL (ref 150–400)
RBC: 5.37 MIL/uL — ABNORMAL HIGH (ref 3.87–5.11)
RDW: 15.2 % (ref 11.5–15.5)
WBC: 12.4 10*3/uL — ABNORMAL HIGH (ref 4.0–10.5)
nRBC: 0 % (ref 0.0–0.2)

## 2021-01-26 LAB — COMPREHENSIVE METABOLIC PANEL
ALT: 14 U/L (ref 0–44)
AST: 15 U/L (ref 15–41)
Albumin: 3.6 g/dL (ref 3.5–5.0)
Alkaline Phosphatase: 67 U/L (ref 38–126)
Anion gap: 10 (ref 5–15)
BUN: 16 mg/dL (ref 6–20)
CO2: 23 mmol/L (ref 22–32)
Calcium: 9.6 mg/dL (ref 8.9–10.3)
Chloride: 109 mmol/L (ref 98–111)
Creatinine, Ser: 0.6 mg/dL (ref 0.44–1.00)
GFR, Estimated: >60 mL/min (ref >60)
Glucose, Bld: 152 mg/dL — ABNORMAL HIGH (ref 70–99)
Potassium: 3.8 mmol/L (ref 3.5–5.1)
Sodium: 142 mmol/L (ref 135–145)
Total Bilirubin: 0.9 mg/dL (ref 0.3–1.2)
Total Protein: 7.4 g/dL (ref 6.5–8.1)

## 2021-01-26 LAB — RAPID URINE DRUG SCREEN, HOSP PERFORMED
Amphetamines: NOT DETECTED
Barbiturates: NOT DETECTED
Benzodiazepines: NOT DETECTED
Cocaine: NOT DETECTED
Opiates: NOT DETECTED
Tetrahydrocannabinol: NOT DETECTED

## 2021-01-26 LAB — URINALYSIS, MICROSCOPIC (REFLEX)

## 2021-01-26 LAB — RESP PANEL BY RT-PCR (FLU A&B, COVID) ARPGX2
Influenza A by PCR: NEGATIVE
Influenza B by PCR: NEGATIVE
SARS Coronavirus 2 by RT PCR: NEGATIVE

## 2021-01-26 LAB — LACTIC ACID, PLASMA: Lactic Acid, Venous: 1.3 mmol/L (ref 0.5–1.9)

## 2021-01-26 LAB — LIPASE, BLOOD: Lipase: 27 U/L (ref 11–51)

## 2021-01-26 LAB — D-DIMER, QUANTITATIVE: D-Dimer, Quant: 0.33 ug/mL-FEU (ref 0.00–0.50)

## 2021-01-26 LAB — PROTIME-INR
INR: 2.3 — ABNORMAL HIGH (ref 0.8–1.2)
Prothrombin Time: 25.4 seconds — ABNORMAL HIGH (ref 11.4–15.2)

## 2021-01-26 MED ORDER — FENTANYL CITRATE (PF) 100 MCG/2ML IJ SOLN
50.0000 ug | Freq: Once | INTRAMUSCULAR | Status: AC
  Administered 2021-01-26: 50 ug via INTRAVENOUS
  Filled 2021-01-26: qty 2

## 2021-01-26 MED ORDER — SODIUM CHLORIDE 0.9 % IV BOLUS
500.0000 mL | Freq: Once | INTRAVENOUS | Status: AC
  Administered 2021-01-26: 500 mL via INTRAVENOUS

## 2021-01-26 MED ORDER — SODIUM CHLORIDE 0.9 % IV BOLUS
500.0000 mL | Freq: Once | INTRAVENOUS | Status: AC
Start: 2021-01-26 — End: 2021-01-26
  Administered 2021-01-26: 500 mL via INTRAVENOUS

## 2021-01-26 MED ORDER — FAMOTIDINE IN NACL 20-0.9 MG/50ML-% IV SOLN
20.0000 mg | Freq: Once | INTRAVENOUS | Status: AC
  Administered 2021-01-26: 20 mg via INTRAVENOUS
  Filled 2021-01-26: qty 50

## 2021-01-26 NOTE — ED Triage Notes (Signed)
EMS reports from [facility], c/o lower abdominal pain since Friday.   BP 148/96 HR 124 RR 18 Sp02 98 RA CBG 145

## 2021-01-26 NOTE — ED Provider Notes (Signed)
[place] DEPT Provider Note   CSN: [id number] Arrival date & time: 01/26/21  1753     History Chief Complaint  Patient presents with   Abdominal Pain    Connie Ruiz is a 60 y.o. female with prior past medical history of CVA, diabetes, renal failure with history of renal transplant, PE on Coumadin, CHF brought in by EMS from [facility] for complaint of abdominal pain.  Patient states that she is been having abdominal pain, worse in the lower quadrants specifically the right lower quadrant since Thursday.  Patient also admits to diffuse diarrhea and nausea.  Denies any bloody stools.  Patient does have history of kidney transplant,cholecystectomy, C-section, tubal ligation.  Denies any vomiting, fevers, chest pain, shortness of breath.  Denies any sick contacts.  Patient is on prednisone, tacrolimus and Coumadin.  HPI     Past Medical History:  Diagnosis Date   Anxiety    Candida esophagitis ([place]) 11/12/2014   CEREBROVASCULAR ACCIDENT, ACUTE 04/15/2010   CLOSTRIDIUM DIFFICILE COLITIS, HX OF 08/21/2007   CONGESTIVE HEART FAILURE 08/21/2007   Current use of long term anticoagulation    Dr. Andree Elk, [facility]   CVA 04/17/2010   Depression    Dr. Andree Elk, [place], TYPE II 08/21/2007   DVT, HX OF 08/21/2007   GERD 08/21/2007   GOUT 08/21/2007   History of stroke with residual effects    HYPERLIPIDEMIA 08/21/2007   HYPERTENSION 08/21/2007   Dr. Andree Elk, [place], HX OF 08/22/2007   s/p renal transplant-Dr. 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   Hypothyroidism 01/14/2018   HLD  (hyperlipidemia) 01/14/2018   CVA (cerebral vascular accident) ([place]) 01/12/2018   Chest pain 01/11/2018   Acute respiratory failure with hypoxia ([place]) 07/20/2017   Abdominal bloating 07/20/2017   Sinus tachycardia 07/20/2017   SLE (systemic lupus erythematosus related syndrome) ([place]) 07/20/2017   CAP (community acquired pneumonia) 07/16/2017   Screening examination for infectious disease 01/28/2016   Type 2 diabetes mellitus ([place]) 08/03/2015   Urinary tract infection 07/20/2015   Vascular dementia ([place]) 05/08/2015   Hemiparesis as late effect of cerebrovascular accident (CVA) ([place]) 05/08/2015   Aphasia as late effect of stroke 05/08/2015   C. difficile colitis 02/25/2015   Hypokalemia 02/25/2015   CKD (chronic kidney disease) 02/25/2015   Colitis 02/14/2015   Sepsis ([place]) 02/14/2015   Nausea vomiting and diarrhea 02/14/2015   Abdominal pain 02/14/2015   UTI (lower urinary tract infection) 02/14/2015   Abdominal pain, chronic, generalized 01/07/2015   Candida esophagitis ([place]) 11/12/2014   Abnormal CT of the abdomen    Steroid-induced hyperglycemia 11/09/2014   Hypomagnesemia 11/06/2014   Abdominal pain, acute    Supratherapeutic INR 11/02/2014   Jejunitis 11/02/2014   Myalgia and myositis 04/05/2014   Wellness examination 04/05/2014   Menopausal state 04/05/2014   Palpitations 08/31/2013   Hyperlipidemia 08/31/2013   Disorder of heart rhythm 08/13/2013   TIA (transient ischemic attack) 06/20/2013   Gout flare: R elbow and R shoulder 06/20/2013   Acute encephalopathy 06/14/2013   Rhabdomyolysis 06/14/2013   History of stroke with  residual effects    Right sided weakness    Breast mass, left 04/30/2013   Contusion of knee, left 10/18/2012   Depression 10/05/2012   Encounter for long-term (current) use of other medications 10/04/2012   Multiple thyroid nodules 06/08/2012   Risk for falls 05/05/2012   Physical deconditioning 05/05/2012   Fever 04/27/2012   DVT (deep  venous thrombosis) ([place]) 06/17/2011   Expressive aphasia 06/17/2011   Dysphasia 06/11/2011   Incontinence of urine 06/11/2011   Hand pain, left 06/11/2011   Immunosuppressive management encounter following kidney transplant 06/05/2011   Hypertension goal BP (blood pressure) < 130/80 Jun 05, 2011   Deceased-donor kidney transplant recipient 05-Jun-2011   Arm pain, left 05/07/2011   Dysfunctional voiding of urine 04/28/2011   Urge incontinence 04/28/2011   Urinary urgency 04/28/2011   Routine general medical examination at a health care facility 04/13/2011   Anticoagulated 01/08/2011   Pulmonary embolism ([place]) 07/16/2010   Cerebral artery occlusion with cerebral infarction ([place]) 04/17/2010   CEREBROVASCULAR ACCIDENT, ACUTE 04/15/2010   THYROID NODULE, LEFT 04/10/2009   Cough 03/26/2009   ACUTE BRONCHITIS 08/22/2007   KIDNEY TRANSPLANTATION, HX OF 08/22/2007   Gout 08/21/2007   Essential hypertension 08/21/2007   Chronic diastolic congestive heart failure ([place]) 08/21/2007   GERD 08/21/2007   Disorder resulting from impaired renal function 08/21/2007   LUPUS 08/21/2007   Osteoporosis 08/21/2007   DVT, HX OF 08/21/2007   Enteritis due to Clostridium difficile 08/21/2007    Past Surgical History:  Procedure Laterality Date   CESAREAN SECTION     CHOLECYSTECTOMY     COLONOSCOPY  06/2000   [facility]    ENTEROSCOPY N/A 11/11/2014   Procedure: ENTEROSCOPY;  Surgeon: Ladene Artist, MD;  Location: [facility];  Service: Endoscopy;  Laterality: N/A;   KIDNEY TRANSPLANT Right 2009   RENAL BIOPSY, OPEN  1981   TUBAL LIGATION       OB History     Gravida  1   Para  1   Term  1   Preterm      AB      Living  1      SAB      IAB      Ectopic      Multiple      Live Births  1           Family History  Problem Relation Age of Onset   Heart attack Mother    Heart disease Father    Asthma Sister    Asthma Daughter    Cancer Maternal Grandfather         prostate   Cancer Paternal Grandfather        colon    Social History   Tobacco Use   Smoking status: Never   Smokeless tobacco: Never  Vaping Use   Vaping Use: Never used  Substance Use Topics   Alcohol use: No    Alcohol/week: 0.0 standard drinks   Drug use: No    Home Medications Prior to Admission medications   Medication Sig Start Date End Date Taking? Authorizing Provider  acetaminophen (TYLENOL) 500 MG tablet Take 1,000 mg by mouth 4 (four) times daily as needed for mild pain or moderate pain.   Yes [provider]  alendronate (FOSAMAX) 70 MG tablet TAKE 1 TABLET BY MOUTH ONCE A WEEK ON AN EMPTY STOMACH ON SATURDAYS WITH A FULL GLASS OF WATER. Patient taking differently: Take 70 mg by mouth once a week.  09/17/17  Yes Renato Shin, MD  atorvastatin (LIPITOR) 40 MG tablet Take 40 mg by mouth daily at 6 PM.  07/26/17  Yes [provider]  Blood Glucose Monitoring Suppl (ACCU-CHEK AVIVA PLUS) w/Device KIT 1 Device by Does not apply route daily. 10/13/18  Yes [name], [name], MD  cholecalciferol (VITAMIN D3) 25 MCG (1000 UNIT) tablet Take 1,000 Units by mouth daily.   Yes [provider]  diltiazem (TIAZAC) 360 MG 24 hr capsule Take 1 capsule (360 mg total) by mouth daily. 06/27/19  Yes [name], [name], MD  donepezil (ARICEPT) 10 MG tablet Take 10 mg by mouth at bedtime.  11/18/16  Yes [provider]  esomeprazole (NEXIUM) 20 MG capsule TAKE 1 CAPSULE(20 MG) BY MOUTH DAILY Patient taking differently: Take 20 mg by mouth daily at 12 noon. 07/17/18  Yes Renato Shin, MD  famotidine (PEPCID) 20 MG tablet Take 20 mg by mouth 2 (two) times daily.   Yes [provider]  feeding supplement, ENSURE ENLIVE, (ENSURE ENLIVE) LIQD Take 237 mLs by mouth 2 (two) times daily between meals. Patient taking differently: Take 237 mLs by mouth as needed (when pt remembers). 08/22/18  Yes Pokhrel, Laxman, MD  furosemide (LASIX) 20 MG tablet Take 20 mg by mouth  daily. 04/25/19  Yes [provider]  gabapentin (NEURONTIN) 300 MG capsule Take 300 mg by mouth 2 (two) times daily.   Yes [provider]  glucose blood (ONE TOUCH ULTRA TEST) test strip Use to check blood sugar 1 time per day 02/12/15  Yes Renato Shin, MD  insulin lispro (HUMALOG) 100 UNIT/ML injection Inject 0-12 Units into the skin 3 (three) times daily before meals. Per Sliding Scale. If blood sugar is: 0-150: 0 units 151-200: 2 units 201-250: 4 units 251-300: 6 units 301-350: 8 units 351-400: 10 units 401-450: 12 units   Yes [provider]  levothyroxine (SYNTHROID) 175 MCG tablet TAKE 1 TABLET(175 MCG) BY MOUTH DAILY BEFORE BREAKFAST Patient taking differently: Take 150 mcg by mouth daily before breakfast. 05/15/19  Yes [name], [name], MD  losartan (COZAAR) 100 MG tablet Take 1 tablet (100 mg total) by mouth daily. 01/03/19  Yes [name], [name], MD  magnesium oxide (MAG-OX) 400 MG tablet Take 800 mg by mouth daily.  01/11/17  Yes [provider]  metFORMIN (GLUCOPHAGE-XR) 500 MG 24 hr tablet TAKE 1 TABLET(500 MG) BY MOUTH DAILY WITH BREAKFAST Patient taking differently: Take 500 mg by mouth daily with breakfast. 06/05/19  Yes [name], [name], MD  mirtazapine (REMERON) 30 MG tablet Take 1 tablet (30 mg total) by mouth at bedtime. 07/28/15  Yes Jaffe, Adam R, DO  multivitamin (RENA-VIT) TABS tablet Take 1 tablet by mouth daily.   Yes [provider]  mycophenolate (CELLCEPT) 250 MG capsule Take 250 mg by mouth 2 (two) times daily.  07/13/17  Yes [provider]  NONFORMULARY OR COMPOUNDED ITEM [facility]:  Pain Cream - Ketamine 5%, Baclofen 2%, Gabapentin 5%, Lidocaine 5%, Menthol 1%, apply 1-2 grams to affected area 3-4 times a day for pain. 12/18/19  Yes Bronson Ing, DPM  nystatin (MYCOSTATIN) powder Apply 1 g topically 4 (four) times daily as needed. For yeast under breast   Yes [provider]  ondansetron  (ZOFRAN ODT) 4 MG disintegrating tablet 34m ODT q4 hours prn nausea/vomiting Patient taking differently: Take 4 mg by mouth every 4 (four) hours as needed for nausea or vomiting. 06/15/15  Yes Mesner, JCorene Cornea MD  ONETOUCH DELICA LANCETS 80H MISC Use to check blood sugar 1 time per day 02/12/15  Yes Renato Shin, MD  Potassium Chloride ER 20 MEQ TBCR Take 1 tablet by mouth daily. NEEDS POTASSIUM RE-CHECK. 06/27/19  Yes [name], [name], MD  predniSONE (DELTASONE) 5 MG tablet Take 5 mg by mouth daily with breakfast.  07/13/17  Yes [provider]  psyllium (METAMUCIL) 58.6 % packet Take 1 packet by mouth daily.   Yes [provider]  sertraline (ZOLOFT) 100 MG tablet Take 0.5 tablets (50 mg total) by mouth daily. Patient taking differently: Take 100 mg by mouth daily. 01/25/17  Yes Renato Shin, MD  Skin Protectants, Misc. (EUCERIN) cream Apply 1 application topically daily. To feet   Yes [provider]  tacrolimus (PROGRAF) 1 MG capsule Take 2.5 mg by mouth every 12 (twelve) hours. 01/15/18  Yes [provider]  traMADol (ULTRAM) 50 MG tablet Take 100 mg by mouth every 6 (six) hours as needed for pain. 01/26/21  Yes [provider]  warfarin (COUMADIN) 5 MG tablet TAKE 1/2 TO 1 TABLET BY MOUTH ON [place]; [place], [place], [place], [place] SUNDAY-2.5MG Patient taking differently: Take 6.5 mg by mouth daily. 03/27/19  Yes [name], [name], MD  calcitRIOL (ROCALTROL) 0.25 MCG capsule TAKE 1 CAPSULE(0.25 MCG) BY MOUTH DAILY. NO REFILLS WITHOUT APPOINTMENT Patient not taking: No sig reported 02/13/19   Renato Shin, MD  morphine (MSIR) 15 MG tablet Take 0.5 tablets (7.5 mg total) by mouth every 6 (six) hours as needed for severe pain. Patient not taking: No sig reported 08/28/20   Suella Broad A, PA-C  QUEtiapine (SEROQUEL) 25 MG tablet Take 1 tablet (25 mg total) by mouth at bedtime. Patient not taking: No sig reported 07/28/15   Metta Clines R, DO   sucralfate (CARAFATE) 1 g tablet Take 1 tablet (1 g total) by mouth 4 (four) times daily for 30 days. Patient not taking: Reported on 01/26/2021 08/22/18 09/11/19  Flora Lipps, MD    Allergies    Oxycodone-acetaminophen, Propoxyphene, Propoxyphene n-acetaminophen, Sulfonamide derivatives, Codeine, Hydrocodone-acetaminophen, Hydromorphone, Other, Oxycodone, Sulfamethoxazole, Tape, Gabapentin, Latex, Metoprolol, Morphine and related, and Rosiglitazone  Review of Systems   Review of Systems  Constitutional:  Negative for chills, diaphoresis, fatigue and fever.  HENT:  Negative for congestion, sore throat and trouble swallowing.   Eyes:  Negative for pain and visual disturbance.  Respiratory:  Negative for cough, shortness of breath and wheezing.   Cardiovascular:  Negative for chest pain, palpitations and leg swelling.  Gastrointestinal:  Positive for abdominal pain, diarrhea and nausea. Negative for abdominal distention and vomiting.  Genitourinary:  Negative for difficulty urinating.  Musculoskeletal:  Negative for back pain, neck pain and neck stiffness.  Skin:  Negative for pallor.  Neurological:  Negative for dizziness, speech difficulty, weakness and headaches.  Psychiatric/Behavioral:  Negative for confusion.    Physical Exam Updated Vital Signs BP (!) 169/115   Pulse (!) 122   Temp 98.1 F (36.7 C) (Oral)   Resp 16   SpO2 100%   Physical Exam Constitutional:      General: She is in acute distress.     Appearance: Normal appearance. She is not ill-appearing, toxic-appearing or diaphoretic.  HENT:     Mouth/Throat:     Mouth: Mucous membranes are moist.     Pharynx: Oropharynx is clear.  Eyes:     General: No scleral icterus.    Extraocular Movements: Extraocular movements intact.  Pupils: Pupils are equal, round, and reactive to light.  Cardiovascular:     Rate and Rhythm: Regular rhythm. Tachycardia present.     Pulses: Normal pulses.     Heart sounds: Normal  heart sounds.  Pulmonary:     Effort: Pulmonary effort is normal. No respiratory distress.     Breath sounds: Normal breath sounds. No stridor. No wheezing, rhonchi or rales.  Chest:     Chest wall: No tenderness.  Abdominal:     General: Abdomen is flat. There is no distension.     Palpations: Abdomen is soft.     Tenderness: There is abdominal tenderness in the right lower quadrant and left lower quadrant. There is guarding. There is no rebound.  Musculoskeletal:        General: No swelling or tenderness. Normal range of motion.     Cervical back: Normal range of motion and neck supple. No rigidity.     Right lower leg: No edema.     Left lower leg: No edema.  Skin:    General: Skin is warm and dry.     Capillary Refill: Capillary refill takes less than 2 seconds.     Coloration: Skin is not pale.  Neurological:     General: No focal deficit present.     Mental Status: She is alert and oriented to person, place, and time.  Psychiatric:        Mood and Affect: Mood normal.        Behavior: Behavior normal.    ED Results / Procedures / Treatments   Labs (all labs ordered are listed, but only abnormal results are displayed) Labs Reviewed  CBC WITH DIFFERENTIAL/PLATELET - Abnormal; Notable for the following components:      Result Value   WBC 12.4 (*)    RBC 5.37 (*)    HCT 46.6 (*)    Neutro Abs 8.3 (*)    All other components within normal limits  COMPREHENSIVE METABOLIC PANEL - Abnormal; Notable for the following components:   Glucose, Bld 152 (*)    All other components within normal limits  URINALYSIS, ROUTINE W REFLEX MICROSCOPIC - Abnormal; Notable for the following components:   Specific Gravity, Urine >1.030 (*)    Bilirubin Urine MODERATE (*)    Ketones, ur 40 (*)    Protein, ur TRACE (*)    All other components within normal limits  PROTIME-INR - Abnormal; Notable for the following components:   Prothrombin Time 25.4 (*)    INR 2.3 (*)    All other  components within normal limits  URINALYSIS, MICROSCOPIC (REFLEX) - Abnormal; Notable for the following components:   Bacteria, UA FEW (*)    All other components within normal limits  RESP PANEL BY RT-PCR (FLU A&B, COVID) ARPGX2  CULTURE, BLOOD (SINGLE)  LIPASE, BLOOD  LACTIC ACID, PLASMA  D-DIMER, QUANTITATIVE  TSH  RAPID URINE DRUG SCREEN, HOSP PERFORMED    EKG None  Radiology CT ABDOMEN PELVIS WO CONTRAST  Result Date: 01/26/2021 CLINICAL DATA:  Concern for bowel obstruction. EXAM: CT ABDOMEN AND PELVIS WITHOUT CONTRAST TECHNIQUE: Multidetector CT imaging of the abdomen and pelvis was performed following the standard protocol without IV contrast. COMPARISON:  CT abdomen pelvis dated 08/21/2020. FINDINGS: Evaluation of this exam is limited in the absence of intravenous contrast. Lower chest: Minimal bibasilar atelectasis. The visualized lung bases are otherwise clear. There is advanced 3 vessel coronary vascular calcification. No intra-abdominal free air or free fluid. Hepatobiliary: The liver  is unremarkable. No intrahepatic biliary dilatation. Cholecystectomy. No retained calcified stone noted in the central CBD. Pancreas: Unremarkable. No pancreatic ductal dilatation or surrounding inflammatory changes. Spleen: Normal in size without focal abnormality. Adrenals/Urinary Tract: The adrenal glands are unremarkable. Atrophic native kidneys. Bilateral renal parenchyma nodularity similar to prior CT may represent residual renal parenchyma. A mass is less likely but not excluded. The visualized ureters appear unremarkable. The urinary bladder is collapsed. There is a right lower quadrant renal transplant. There is no hydronephrosis or nephrolithiasis of the transplant kidney. No peritransplant fluid collection. Stomach/Bowel: There is diffuse colonic diverticulosis without active inflammatory changes. There is mild thickened appearance of the jejunal folds with engorgement of the associated  mesentery. Clinical correlation is recommended to evaluate for possibility of enteritis. There is no bowel obstruction. The appendix is normal. Vascular/Lymphatic: Moderate aortoiliac atherosclerotic disease. The IVC is unremarkable. No portal venous gas. There is no adenopathy. Reproductive: The uterus is grossly unremarkable. No adnexal masses. Other: Mild diffuse subcutaneous stranding.  No fluid collection. Musculoskeletal: Osteopenia with degenerative changes of the spine. Multiple old left rib fractures. No acute osseous pathology. IMPRESSION: 1. Mild thickened appearance of the jejunal folds with engorgement of the associated mesentery. Clinical correlation is recommended to evaluate for possibility of enteritis. No bowel obstruction. Normal appendix. 2. Colonic diverticulosis. 3. Atrophic native kidneys with a right lower quadrant renal transplant. No hydronephrosis or nephrolithiasis. 4. Aortic Atherosclerosis (ICD10-I70.0). Electronically Signed   By: Anner Crete M.D.   On: 01/26/2021 20:23   DG Chest 2 View  Result Date: 01/26/2021 CLINICAL DATA:  Lower abdominal pain since Friday, tachycardia EXAM: CHEST - 2 VIEW COMPARISON:  08/28/2020 FINDINGS: Frontal and lateral views of the chest demonstrate an unremarkable cardiac silhouette. No acute airspace disease, effusion, or pneumothorax. No acute bony abnormalities. IMPRESSION: 1. No acute intrathoracic process. Electronically Signed   By: Randa Ngo M.D.   On: 01/26/2021 22:17    Procedures Procedures   Medications Ordered in ED Medications  fentaNYL (SUBLIMAZE) injection 50 mcg (50 mcg Intravenous Given 01/26/21 1857)  sodium chloride 0.9 % bolus 500 mL (0 mLs Intravenous Stopped 01/26/21 2041)  fentaNYL (SUBLIMAZE) injection 50 mcg (50 mcg Intravenous Given 01/26/21 2110)  famotidine (PEPCID) IVPB 20 mg premix (0 mg Intravenous Stopped 01/26/21 2149)  sodium chloride 0.9 % bolus 500 mL (0 mLs Intravenous Stopped 01/26/21 2231)  sodium  chloride 0.9 % bolus 500 mL (0 mLs Intravenous Stopped 01/26/21 2231)  sodium chloride 0.9 % bolus 500 mL (500 mLs Intravenous New Bag/Given 01/26/21 2307)    ED Course  I have reviewed the triage vital signs and the nursing notes.  Pertinent labs & imaging results that were available during my care of the patient were reviewed by me and considered in my medical decision making (see chart for details).    MDM Rules/Calculators/A&P                           Patient presents emerged from today for abdominal pain, nausea, diarrhea.  Patient does have rebound guarding to right lower quadrant and left lower quadrant, appears uncomfortable, however nontoxic appearing.  Patient is tachycardic, suspicion for SBO.  Will obtain labs, give fentanyl and fluids and obtain CT imaging.   Work-up today grossly unremarkable, does have slight white count of 12.6, however patient is on prednisone.  CT does show enteritis.  Lactic acid normal.  However patient has been persistently tachycardic, has been given  2 L of fluid and still tachycardic.  Regular rhythm.  No definite reasons for tachycardia, D-dimer negative, no signs of infection.  However since patient is persistently tachycardic will need echocardiogram and admission at this time.  Pt care was handed off to K. Humes PA-C at 82.  Complete history and physical and current plan have been communicated.  Please refer to their note for the remainder of ED care and ultimate disposition.  Patient to be admitted.  Does technically meet SIRS criteria, however do not think that patient is septic, blood culture ordered, PA Humes will speak to hospitalist about antibiotics.  I discussed this case with my attending physician who cosigned this note including patient's presenting symptoms, physical exam, and planned diagnostics and interventions. Attending physician stated agreement with plan or made changes to plan which were implemented.   Attending physician assessed  patient at bedside.   Final Clinical Impression(s) / ED Diagnoses Final diagnoses:  Lower abdominal pain  Tachycardia    Rx / DC Orders ED Discharge Orders     None        Alfredia Client, PA-C 01/27/21 0016    Luna Fuse, MD 01/27/21 [phone]

## 2021-01-26 NOTE — ED Notes (Signed)
Called patients daughter Briant Cedar at [phone] on the patients request and gave her an update.

## 2021-01-26 NOTE — ED Notes (Signed)
Pure wick placed to obtain urine.

## 2021-01-27 DIAGNOSIS — E669 Obesity, unspecified: Secondary | ICD-10-CM | POA: Diagnosis not present

## 2021-01-27 DIAGNOSIS — R197 Diarrhea, unspecified: Secondary | ICD-10-CM | POA: Diagnosis not present

## 2021-01-27 DIAGNOSIS — I700 Atherosclerosis of aorta: Secondary | ICD-10-CM | POA: Diagnosis not present

## 2021-01-27 DIAGNOSIS — I825Z9 Chronic embolism and thrombosis of unspecified deep veins of unspecified distal lower extremity: Secondary | ICD-10-CM | POA: Diagnosis not present

## 2021-01-27 DIAGNOSIS — T451X5A Adverse effect of antineoplastic and immunosuppressive drugs, initial encounter: Secondary | ICD-10-CM | POA: Diagnosis present

## 2021-01-27 DIAGNOSIS — E876 Hypokalemia: Secondary | ICD-10-CM | POA: Diagnosis not present

## 2021-01-27 DIAGNOSIS — Z91040 Latex allergy status: Secondary | ICD-10-CM | POA: Diagnosis not present

## 2021-01-27 DIAGNOSIS — I110 Hypertensive heart disease with heart failure: Secondary | ICD-10-CM | POA: Diagnosis not present

## 2021-01-27 DIAGNOSIS — R011 Cardiac murmur, unspecified: Secondary | ICD-10-CM | POA: Diagnosis not present

## 2021-01-27 DIAGNOSIS — E1122 Type 2 diabetes mellitus with diabetic chronic kidney disease: Secondary | ICD-10-CM | POA: Diagnosis not present

## 2021-01-27 DIAGNOSIS — I10 Essential (primary) hypertension: Secondary | ICD-10-CM | POA: Diagnosis not present

## 2021-01-27 DIAGNOSIS — K529 Noninfective gastroenteritis and colitis, unspecified: Secondary | ICD-10-CM | POA: Diagnosis not present

## 2021-01-27 DIAGNOSIS — Y92129 Unspecified place in nursing home as the place of occurrence of the external cause: Secondary | ICD-10-CM | POA: Diagnosis not present

## 2021-01-27 DIAGNOSIS — E1165 Type 2 diabetes mellitus with hyperglycemia: Secondary | ICD-10-CM | POA: Diagnosis not present

## 2021-01-27 DIAGNOSIS — E46 Unspecified protein-calorie malnutrition: Secondary | ICD-10-CM | POA: Diagnosis not present

## 2021-01-27 DIAGNOSIS — Z531 Procedure and treatment not carried out because of patient's decision for reasons of belief and group pressure: Secondary | ICD-10-CM | POA: Diagnosis present

## 2021-01-27 DIAGNOSIS — R109 Unspecified abdominal pain: Secondary | ICD-10-CM | POA: Diagnosis not present

## 2021-01-27 DIAGNOSIS — Z7401 Bed confinement status: Secondary | ICD-10-CM | POA: Diagnosis not present

## 2021-01-27 DIAGNOSIS — R110 Nausea: Secondary | ICD-10-CM | POA: Diagnosis not present

## 2021-01-27 DIAGNOSIS — Z743 Need for continuous supervision: Secondary | ICD-10-CM | POA: Diagnosis not present

## 2021-01-27 DIAGNOSIS — Z6838 Body mass index (BMI) 38.0-38.9, adult: Secondary | ICD-10-CM | POA: Diagnosis not present

## 2021-01-27 DIAGNOSIS — N186 End stage renal disease: Secondary | ICD-10-CM | POA: Diagnosis not present

## 2021-01-27 DIAGNOSIS — R1084 Generalized abdominal pain: Secondary | ICD-10-CM | POA: Diagnosis not present

## 2021-01-27 DIAGNOSIS — Z825 Family history of asthma and other chronic lower respiratory diseases: Secondary | ICD-10-CM | POA: Diagnosis not present

## 2021-01-27 DIAGNOSIS — Z8249 Family history of ischemic heart disease and other diseases of the circulatory system: Secondary | ICD-10-CM | POA: Diagnosis not present

## 2021-01-27 DIAGNOSIS — I5032 Chronic diastolic (congestive) heart failure: Secondary | ICD-10-CM | POA: Diagnosis not present

## 2021-01-27 DIAGNOSIS — E039 Hypothyroidism, unspecified: Secondary | ICD-10-CM | POA: Diagnosis not present

## 2021-01-27 DIAGNOSIS — Z20822 Contact with and (suspected) exposure to covid-19: Secondary | ICD-10-CM | POA: Diagnosis not present

## 2021-01-27 DIAGNOSIS — Z885 Allergy status to narcotic agent status: Secondary | ICD-10-CM | POA: Diagnosis not present

## 2021-01-27 DIAGNOSIS — M329 Systemic lupus erythematosus, unspecified: Secondary | ICD-10-CM | POA: Diagnosis not present

## 2021-01-27 DIAGNOSIS — I6930 Unspecified sequelae of cerebral infarction: Secondary | ICD-10-CM | POA: Diagnosis not present

## 2021-01-27 DIAGNOSIS — R531 Weakness: Secondary | ICD-10-CM | POA: Diagnosis not present

## 2021-01-27 DIAGNOSIS — K521 Toxic gastroenteritis and colitis: Secondary | ICD-10-CM | POA: Diagnosis not present

## 2021-01-27 DIAGNOSIS — M109 Gout, unspecified: Secondary | ICD-10-CM | POA: Diagnosis not present

## 2021-01-27 DIAGNOSIS — Z4682 Encounter for fitting and adjustment of non-vascular catheter: Secondary | ICD-10-CM | POA: Diagnosis not present

## 2021-01-27 DIAGNOSIS — K5730 Diverticulosis of large intestine without perforation or abscess without bleeding: Secondary | ICD-10-CM | POA: Diagnosis not present

## 2021-01-27 DIAGNOSIS — A0472 Enterocolitis due to Clostridium difficile, not specified as recurrent: Secondary | ICD-10-CM | POA: Diagnosis not present

## 2021-01-27 DIAGNOSIS — F32A Depression, unspecified: Secondary | ICD-10-CM | POA: Diagnosis not present

## 2021-01-27 DIAGNOSIS — Z8042 Family history of malignant neoplasm of prostate: Secondary | ICD-10-CM | POA: Diagnosis not present

## 2021-01-27 DIAGNOSIS — Z86711 Personal history of pulmonary embolism: Secondary | ICD-10-CM | POA: Diagnosis not present

## 2021-01-27 DIAGNOSIS — K219 Gastro-esophageal reflux disease without esophagitis: Secondary | ICD-10-CM | POA: Diagnosis not present

## 2021-01-27 DIAGNOSIS — R000 Tachycardia, unspecified: Secondary | ICD-10-CM | POA: Diagnosis not present

## 2021-01-27 DIAGNOSIS — Z9049 Acquired absence of other specified parts of digestive tract: Secondary | ICD-10-CM | POA: Diagnosis not present

## 2021-01-27 DIAGNOSIS — Z940 Kidney transplant status: Secondary | ICD-10-CM | POA: Diagnosis not present

## 2021-01-27 DIAGNOSIS — N261 Atrophy of kidney (terminal): Secondary | ICD-10-CM | POA: Diagnosis not present

## 2021-01-27 DIAGNOSIS — R112 Nausea with vomiting, unspecified: Secondary | ICD-10-CM | POA: Diagnosis not present

## 2021-01-27 DIAGNOSIS — Z888 Allergy status to other drugs, medicaments and biological substances status: Secondary | ICD-10-CM | POA: Diagnosis not present

## 2021-01-27 DIAGNOSIS — Z800 Family history of malignant neoplasm of digestive organs: Secondary | ICD-10-CM | POA: Diagnosis not present

## 2021-01-27 LAB — C DIFFICILE QUICK SCREEN W PCR REFLEX
C Diff antigen: POSITIVE — AB
C Diff toxin: NEGATIVE

## 2021-01-27 LAB — TSH: TSH: 0.547 u[IU]/mL (ref 0.350–4.500)

## 2021-01-27 LAB — GASTROINTESTINAL PANEL BY PCR, STOOL (REPLACES STOOL CULTURE)

## 2021-01-27 LAB — HEMOGLOBIN A1C
Hgb A1c MFr Bld: 8.2 % — ABNORMAL HIGH (ref 4.8–5.6)
Mean Plasma Glucose: 188.64 mg/dL

## 2021-01-27 LAB — HIV ANTIBODY (ROUTINE TESTING W REFLEX): HIV Screen 4th Generation wRfx: NONREACTIVE

## 2021-01-27 LAB — GLUCOSE, CAPILLARY
Glucose-Capillary: 110 mg/dL — ABNORMAL HIGH (ref 70–99)
Glucose-Capillary: 110 mg/dL — ABNORMAL HIGH (ref 70–99)
Glucose-Capillary: 123 mg/dL — ABNORMAL HIGH (ref 70–99)
Glucose-Capillary: 149 mg/dL — ABNORMAL HIGH (ref 70–99)

## 2021-01-27 LAB — CLOSTRIDIUM DIFFICILE BY PCR, REFLEXED: Toxigenic C. Difficile by PCR: POSITIVE — AB

## 2021-01-27 MED ORDER — DILTIAZEM HCL ER COATED BEADS 120 MG PO CP24
360.0000 mg | ORAL_CAPSULE | Freq: Every day | ORAL | Status: DC
  Administered 2021-01-27 – 2021-01-29 (×3): 360 mg via ORAL
  Filled 2021-01-27 (×4): qty 3

## 2021-01-27 MED ORDER — WARFARIN SODIUM 6 MG PO TABS
6.5000 mg | ORAL_TABLET | Freq: Once | ORAL | Status: AC
  Administered 2021-01-27: 6.5 mg via ORAL
  Filled 2021-01-27: qty 1

## 2021-01-27 MED ORDER — MIRTAZAPINE 15 MG PO TABS
30.0000 mg | ORAL_TABLET | Freq: Every day | ORAL | Status: DC
  Administered 2021-01-27 – 2021-02-13 (×18): 30 mg via ORAL
  Filled 2021-01-27 (×18): qty 2

## 2021-01-27 MED ORDER — INSULIN ASPART 100 UNIT/ML IJ SOLN
0.0000 [IU] | Freq: Three times a day (TID) | INTRAMUSCULAR | Status: DC
  Administered 2021-01-27: 1 [IU] via SUBCUTANEOUS
  Administered 2021-01-28: 2 [IU] via SUBCUTANEOUS
  Administered 2021-01-28: 1 [IU] via SUBCUTANEOUS
  Administered 2021-01-29: 2 [IU] via SUBCUTANEOUS
  Administered 2021-01-31: 1 [IU] via SUBCUTANEOUS
  Administered 2021-01-31 – 2021-02-01 (×3): 2 [IU] via SUBCUTANEOUS
  Administered 2021-02-01: 1 [IU] via SUBCUTANEOUS
  Administered 2021-02-01: 2 [IU] via SUBCUTANEOUS
  Administered 2021-02-02: 1 [IU] via SUBCUTANEOUS
  Administered 2021-02-02 – 2021-02-03 (×4): 2 [IU] via SUBCUTANEOUS
  Administered 2021-02-03: 1 [IU] via SUBCUTANEOUS
  Administered 2021-02-04: 2 [IU] via SUBCUTANEOUS
  Administered 2021-02-04: 1 [IU] via SUBCUTANEOUS
  Administered 2021-02-05: 2 [IU] via SUBCUTANEOUS
  Administered 2021-02-05 (×2): 3 [IU] via SUBCUTANEOUS
  Administered 2021-02-06: 2 [IU] via SUBCUTANEOUS
  Administered 2021-02-06: 5 [IU] via SUBCUTANEOUS
  Administered 2021-02-06: 3 [IU] via SUBCUTANEOUS
  Administered 2021-02-07: 1 [IU] via SUBCUTANEOUS
  Administered 2021-02-07: 3 [IU] via SUBCUTANEOUS
  Administered 2021-02-07 – 2021-02-08 (×2): 2 [IU] via SUBCUTANEOUS
  Administered 2021-02-08 (×2): 1 [IU] via SUBCUTANEOUS
  Administered 2021-02-09 (×2): 2 [IU] via SUBCUTANEOUS
  Administered 2021-02-09: 1 [IU] via SUBCUTANEOUS
  Administered 2021-02-10: 5 [IU] via SUBCUTANEOUS
  Administered 2021-02-10: 2 [IU] via SUBCUTANEOUS
  Administered 2021-02-11: 1 [IU] via SUBCUTANEOUS
  Administered 2021-02-11: 3 [IU] via SUBCUTANEOUS
  Administered 2021-02-11: 2 [IU] via SUBCUTANEOUS
  Administered 2021-02-12: 1 [IU] via SUBCUTANEOUS
  Administered 2021-02-12 (×2): 2 [IU] via SUBCUTANEOUS
  Administered 2021-02-13 (×2): 3 [IU] via SUBCUTANEOUS
  Administered 2021-02-13: 2 [IU] via SUBCUTANEOUS

## 2021-01-27 MED ORDER — VANCOMYCIN HCL 125 MG PO CAPS
125.0000 mg | ORAL_CAPSULE | Freq: Four times a day (QID) | ORAL | Status: DC
  Administered 2021-01-27 – 2021-01-29 (×9): 125 mg via ORAL
  Filled 2021-01-27 (×15): qty 1

## 2021-01-27 MED ORDER — ACETAMINOPHEN 650 MG RE SUPP
650.0000 mg | Freq: Four times a day (QID) | RECTAL | Status: DC | PRN
  Administered 2021-01-30: 650 mg via RECTAL
  Filled 2021-01-27: qty 1

## 2021-01-27 MED ORDER — PANTOPRAZOLE SODIUM 40 MG PO TBEC
40.0000 mg | DELAYED_RELEASE_TABLET | Freq: Every day | ORAL | Status: DC
  Administered 2021-01-27 – 2021-01-29 (×3): 40 mg via ORAL
  Filled 2021-01-27 (×3): qty 1

## 2021-01-27 MED ORDER — METRONIDAZOLE 500 MG/100ML IV SOLN
500.0000 mg | Freq: Two times a day (BID) | INTRAVENOUS | Status: DC
  Administered 2021-01-27: 500 mg via INTRAVENOUS
  Filled 2021-01-27: qty 100

## 2021-01-27 MED ORDER — ONDANSETRON HCL 4 MG/2ML IJ SOLN
4.0000 mg | Freq: Four times a day (QID) | INTRAMUSCULAR | Status: DC | PRN
  Administered 2021-01-27 – 2021-01-29 (×2): 4 mg via INTRAVENOUS
  Filled 2021-01-27 (×2): qty 2

## 2021-01-27 MED ORDER — METRONIDAZOLE 500 MG/100ML IV SOLN
500.0000 mg | Freq: Three times a day (TID) | INTRAVENOUS | Status: DC
  Administered 2021-01-27: 500 mg via INTRAVENOUS
  Filled 2021-01-27: qty 100

## 2021-01-27 MED ORDER — WARFARIN - PHARMACIST DOSING INPATIENT
Freq: Every day | Status: DC
Start: 2021-01-27 — End: 2021-01-30

## 2021-01-27 MED ORDER — INSULIN ASPART 100 UNIT/ML IJ SOLN
0.0000 [IU] | Freq: Every day | INTRAMUSCULAR | Status: DC
  Administered 2021-02-05: 3 [IU] via SUBCUTANEOUS
  Administered 2021-02-11 – 2021-02-12 (×2): 2 [IU] via SUBCUTANEOUS

## 2021-01-27 MED ORDER — SODIUM CHLORIDE 0.9 % IV SOLN
2.0000 g | Freq: Once | INTRAVENOUS | Status: AC
  Administered 2021-01-27: 2 g via INTRAVENOUS
  Filled 2021-01-27: qty 20

## 2021-01-27 MED ORDER — PREDNISONE 5 MG PO TABS
5.0000 mg | ORAL_TABLET | Freq: Every day | ORAL | Status: DC
  Administered 2021-01-27 – 2021-01-30 (×4): 5 mg via ORAL
  Filled 2021-01-27 (×5): qty 1

## 2021-01-27 MED ORDER — ACETAMINOPHEN 325 MG PO TABS
650.0000 mg | ORAL_TABLET | Freq: Four times a day (QID) | ORAL | Status: DC | PRN
  Administered 2021-02-06 – 2021-02-10 (×3): 650 mg via ORAL
  Filled 2021-01-27 (×4): qty 2

## 2021-01-27 MED ORDER — FUROSEMIDE 20 MG PO TABS
20.0000 mg | ORAL_TABLET | Freq: Every day | ORAL | Status: DC
  Administered 2021-01-27 – 2021-01-29 (×3): 20 mg via ORAL
  Filled 2021-01-27 (×3): qty 1

## 2021-01-27 MED ORDER — DONEPEZIL HCL 10 MG PO TABS
10.0000 mg | ORAL_TABLET | Freq: Every day | ORAL | Status: DC
  Administered 2021-01-27 – 2021-02-13 (×18): 10 mg via ORAL
  Filled 2021-01-27 (×19): qty 1

## 2021-01-27 MED ORDER — ONDANSETRON HCL 4 MG PO TABS: 4.0000 mg | ORAL_TABLET | Freq: Four times a day (QID) | ORAL | Status: DC | PRN

## 2021-01-27 MED ORDER — HYDRALAZINE HCL 25 MG PO TABS
25.0000 mg | ORAL_TABLET | Freq: Four times a day (QID) | ORAL | Status: DC | PRN
  Administered 2021-01-30: 25 mg via ORAL
  Filled 2021-01-27: qty 1

## 2021-01-27 MED ORDER — TRAMADOL HCL 50 MG PO TABS
100.0000 mg | ORAL_TABLET | Freq: Four times a day (QID) | ORAL | Status: DC | PRN
  Administered 2021-01-27 – 2021-01-28 (×3): 100 mg via ORAL
  Filled 2021-01-27 (×3): qty 2

## 2021-01-27 MED ORDER — SERTRALINE HCL 100 MG PO TABS
100.0000 mg | ORAL_TABLET | Freq: Every day | ORAL | Status: DC
  Administered 2021-01-27 – 2021-01-29 (×3): 100 mg via ORAL
  Filled 2021-01-27 (×3): qty 1

## 2021-01-27 MED ORDER — FAMOTIDINE 20 MG PO TABS
20.0000 mg | ORAL_TABLET | Freq: Two times a day (BID) | ORAL | Status: DC
  Administered 2021-01-27 – 2021-01-29 (×6): 20 mg via ORAL
  Filled 2021-01-27 (×6): qty 1

## 2021-01-27 MED ORDER — FENTANYL CITRATE (PF) 100 MCG/2ML IJ SOLN
25.0000 ug | Freq: Once | INTRAMUSCULAR | Status: AC | PRN
Start: 2021-01-27 — End: 2021-01-27
  Administered 2021-01-27: 25 ug via INTRAVENOUS
  Filled 2021-01-27: qty 2

## 2021-01-27 MED ORDER — MYCOPHENOLATE MOFETIL 250 MG PO CAPS
250.0000 mg | ORAL_CAPSULE | Freq: Two times a day (BID) | ORAL | Status: DC
  Administered 2021-01-27 – 2021-02-05 (×17): 250 mg via ORAL
  Filled 2021-01-27 (×20): qty 1

## 2021-01-27 MED ORDER — GABAPENTIN 300 MG PO CAPS
300.0000 mg | ORAL_CAPSULE | Freq: Two times a day (BID) | ORAL | Status: DC
  Administered 2021-01-27 – 2021-01-29 (×6): 300 mg via ORAL
  Filled 2021-01-27 (×6): qty 1

## 2021-01-27 MED ORDER — FENTANYL CITRATE (PF) 100 MCG/2ML IJ SOLN
12.5000 ug | INTRAMUSCULAR | Status: DC | PRN
  Administered 2021-01-27: 12.5 ug via INTRAVENOUS
  Administered 2021-01-27 (×2): 25 ug via INTRAVENOUS
  Filled 2021-01-27 (×3): qty 2

## 2021-01-27 MED ORDER — MAGNESIUM OXIDE -MG SUPPLEMENT 400 (240 MG) MG PO TABS
800.0000 mg | ORAL_TABLET | Freq: Every day | ORAL | Status: DC
  Administered 2021-01-27 – 2021-01-29 (×3): 800 mg via ORAL
  Filled 2021-01-27 (×3): qty 2

## 2021-01-27 MED ORDER — TACROLIMUS 1 MG PO CAPS
2.5000 mg | ORAL_CAPSULE | Freq: Two times a day (BID) | ORAL | Status: DC
  Administered 2021-01-27 – 2021-02-13 (×34): 2.5 mg via ORAL
  Filled 2021-01-27 (×36): qty 1

## 2021-01-27 MED ORDER — ATORVASTATIN CALCIUM 40 MG PO TABS
40.0000 mg | ORAL_TABLET | Freq: Every day | ORAL | Status: DC
  Administered 2021-01-27 – 2021-01-29 (×3): 40 mg via ORAL
  Filled 2021-01-27 (×3): qty 1

## 2021-01-27 MED ORDER — LEVOTHYROXINE SODIUM 150 MCG PO TABS
150.0000 ug | ORAL_TABLET | Freq: Every day | ORAL | Status: DC
  Administered 2021-01-28 – 2021-01-30 (×3): 150 ug via ORAL
  Filled 2021-01-27 (×4): qty 1

## 2021-01-27 MED ORDER — SODIUM CHLORIDE 0.9 % IV SOLN: 2.0000 g | INTRAVENOUS | Status: DC

## 2021-01-27 MED ORDER — LOSARTAN POTASSIUM 50 MG PO TABS
100.0000 mg | ORAL_TABLET | Freq: Every day | ORAL | Status: DC
  Administered 2021-01-27 – 2021-01-29 (×3): 100 mg via ORAL
  Filled 2021-01-27 (×3): qty 2

## 2021-01-27 MED ORDER — POLYETHYLENE GLYCOL 3350 17 G PO PACK: 17.0000 g | PACK | Freq: Every day | ORAL | Status: DC | PRN

## 2021-01-27 MED ORDER — SODIUM CHLORIDE 0.9 % IV SOLN: INTRAVENOUS | Status: DC

## 2021-01-27 NOTE — ED Notes (Signed)
Pt changed and stool specimen collected.

## 2021-01-27 NOTE — H&P (Addendum)
History and Physical    Connie Ruiz WOE:[id number] DOB: 1961/02/08 DOA: 01/26/2021  PCP: [name], [name], MD  Patient coming from: [place] have personally briefly reviewed patient's old medical records in [place]  Chief Complaint: Abdominal pain  HPI: Connie Ruiz is a 60 y.o. female [name], with medical history significant for previous ESRD now status post renal transplant 2005, hypothyroidism, gout, HOCM, GERD, HLD, DM2, HTN, SLE, DVT on warfarin who presents from [facility] with abdominal pain and diarrhea since Friday.  Patient reports roughly 7 episodes per day, watery, denies any blood in stool.  Associated with weakness/fatigue.  Patient denies any sick contacts, no recent antibiotic use; no recent travel.  Further denies no significant changes in dietary habits.  Patient denied fever/chills, no nausea/vomiting, no chest pain, palpitations, no shortness of breath, no cough/congestion.  ED Course: Temperature 98.1 F, HR 128, RR 24, BP 162/103, SPO2 100% on room air.  UDS negative.  WBC 12.4, hemoglobin 14.4, platelets 334.  Sodium 142, potassium 3.8, chloride 109, CO2 23, BUN 16, creatinine 0.60, glucose 152.  Lactic acid 1.3.  COVID-19 PCR negative.  Influenza A/B PCR negative.  Urinalysis unrevealing.  Chest x-ray with no acute cardiopulmonary disease process.  CT abdomen/pelvis notable for enteritis, no obstruction.  Blood cultures x2 were ordered.  Patient was started on ceftriaxone 2 g IV, metronidazole IV, received 2 L NS bolus and Pepcid IV.  TRH consulted for further evaluation and management of abdominal pain associated with diarrhea secondary to enteritis.  Review of Systems:  Constitutional - No Fatigue, No Weight Loss Vision - No impaired vision, decreased visual acuity Ear/Nose/Mouth/Throat - No decreased hearing, no congestion Respiratory - No shortness of breath, no exertional dyspnea, chronic cough Cardiovascular - No chest pain, no  palpitations, no peripheral edema Gastrointestinal - No nausea, + diarrhea/abdominal pain, no constipation, Genitourinary - No excessive urination, no urinary incontinence Integumentary - No rashes or concerning skin lesions Neurologic - No numbness, no tingling, no dizziness, no headaches, no confusion or memory loss   Past Medical History:  Diagnosis Date   Anxiety    Candida esophagitis ([place]) 11/12/2014   CEREBROVASCULAR ACCIDENT, ACUTE 04/15/2010   CLOSTRIDIUM DIFFICILE COLITIS, HX OF 08/21/2007   CONGESTIVE HEART FAILURE 08/21/2007   Current use of long term anticoagulation    Dr. Andree Elk, [facility]   CVA 04/17/2010   Depression    Dr. Andree Elk, [place], TYPE II 08/21/2007   DVT, HX OF 08/21/2007   GERD 08/21/2007   GOUT 08/21/2007   History of stroke with residual effects    HYPERLIPIDEMIA 08/21/2007   HYPERTENSION 08/21/2007   Dr. Andree Elk, [place], HX OF 08/22/2007   s/p renal transplant-Dr. Bonney Leitz   LUPUS 08/21/2007   OSTEOPOROSIS 08/21/2007   [facility]umatol at [facility]   Pulmonary embolism ([place]) 07/16/2010   Renal failure    RENAL INSUFFICIENCY 08/21/2007   Right sided weakness    Steroid-induced hyperglycemia 11/09/2014   Tachycardia    THYROID NODULE, LEFT 04/10/2009    Past Surgical History:  Procedure Laterality Date   CESAREAN SECTION     CHOLECYSTECTOMY     COLONOSCOPY  06/2000   [facility]    ENTEROSCOPY N/A 11/11/2014   Procedure: ENTEROSCOPY;  Surgeon: Ladene Artist, MD;  Location: [facility] END[facility]COPY;  Service: Endoscopy;  Laterality: N/A;   KIDNEY TRANSPLANT Right 2009   RENAL BIOPSY, OPEN  1981   TUBAL  LIGATION       reports that she has never smoked. She has never used smokeless tobacco. She reports that she does not drink alcohol and does not use drugs.  Allergies  Allergen Reactions   Oxycodone-Acetaminophen Shortness Of Breath and Nausea Only   Propoxyphene Nausea Only and Shortness Of Breath    States takes tylenol at  home   Propoxyphene N-Acetaminophen Shortness Of Breath and Nausea Only   Sulfonamide Derivatives Shortness Of Breath and Nausea Only   Codeine Nausea Only   Hydrocodone-Acetaminophen    Hydromorphone    Other Other (See Comments)    No blood, Jehovaeh Witness    Oxycodone    Sulfamethoxazole    Tape     Redness**PAPER TAPE OK**   Gabapentin Anxiety    twitching   Latex Rash   Metoprolol Rash   Morphine And Related Rash    IV site on arm is red, patient reports this is improving.  NO shortness of breath reported.   Rosiglitazone Rash    Family History  Problem Relation Age of Onset   Heart attack Mother    Heart disease Father    Asthma Sister    Asthma Daughter    Cancer Maternal Grandfather        prostate   Cancer Paternal Grandfather        colon    Family history reviewed and not pertinent   Prior to Admission medications   Medication Sig Start Date End Date Taking? Authorizing Provider  acetaminophen (TYLENOL) 500 MG tablet Take 1,000 mg by mouth 4 (four) times daily as needed for mild pain or moderate pain.   Yes [provider]  alendronate (FOSAMAX) 70 MG tablet TAKE 1 TABLET BY MOUTH ONCE A WEEK ON AN EMPTY STOMACH ON SATURDAYS WITH A FULL GLASS OF WATER. Patient taking differently: Take 70 mg by mouth once a week. 09/17/17  Yes Renato Shin, MD  atorvastatin (LIPITOR) 40 MG tablet Take 40 mg by mouth daily at 6 PM.  07/26/17  Yes [provider]  Blood Glucose Monitoring Suppl (ACCU-CHEK AVIVA PLUS) w/Device KIT 1 Device by Does not apply route daily. 10/13/18  Yes [name], [name], MD  cholecalciferol (VITAMIN D3) 25 MCG (1000 UNIT) tablet Take 1,000 Units by mouth daily.   Yes [provider]  diltiazem (TIAZAC) 360 MG 24 hr capsule Take 1 capsule (360 mg total) by mouth daily. 06/27/19  Yes [name], [name], MD  donepezil (ARICEPT) 10 MG tablet Take 10 mg by mouth at bedtime.  11/18/16  Yes [provider]  esomeprazole (NEXIUM)  20 MG capsule TAKE 1 CAPSULE(20 MG) BY MOUTH DAILY Patient taking differently: Take 20 mg by mouth daily at 12 noon. 07/17/18  Yes Renato Shin, MD  famotidine (PEPCID) 20 MG tablet Take 20 mg by mouth 2 (two) times daily.   Yes [provider]  feeding supplement, ENSURE ENLIVE, (ENSURE ENLIVE) LIQD Take 237 mLs by mouth 2 (two) times daily between meals. Patient taking differently: Take 237 mLs by mouth as needed (when pt remembers). 08/22/18  Yes Pokhrel, Laxman, MD  furosemide (LASIX) 20 MG tablet Take 20 mg by mouth daily. 04/25/19  Yes [provider]  gabapentin (NEURONTIN) 300 MG capsule Take 300 mg by mouth 2 (two) times daily.   Yes [provider]  glucose blood (ONE TOUCH ULTRA TEST) test strip Use to check blood sugar 1 time per day 02/12/15  Yes Renato Shin, MD  insulin  lispro (HUMALOG) 100 UNIT/ML injection Inject 0-12 Units into the skin 3 (three) times daily before meals. Per Sliding Scale. If blood sugar is: 0-150: 0 units 151-200: 2 units 201-250: 4 units 251-300: 6 units 301-350: 8 units 351-400: 10 units 401-450: 12 units   Yes [provider]  levothyroxine (SYNTHROID) 175 MCG tablet TAKE 1 TABLET(175 MCG) BY MOUTH DAILY BEFORE BREAKFAST Patient taking differently: Take 150 mcg by mouth daily before breakfast. 05/15/19  Yes [name], [name], MD  losartan (COZAAR) 100 MG tablet Take 1 tablet (100 mg total) by mouth daily. 01/03/19  Yes [name], [name], MD  magnesium oxide (MAG-OX) 400 MG tablet Take 800 mg by mouth daily.  01/11/17  Yes [provider]  metFORMIN (GLUCOPHAGE-XR) 500 MG 24 hr tablet TAKE 1 TABLET(500 MG) BY MOUTH DAILY WITH BREAKFAST Patient taking differently: Take 500 mg by mouth daily with breakfast. 06/05/19  Yes [name], [name], MD  mirtazapine (REMERON) 30 MG tablet Take 1 tablet (30 mg total) by mouth at bedtime. 07/28/15  Yes Jaffe, Adam R, DO  multivitamin (RENA-VIT) TABS tablet Take 1 tablet by mouth daily.    Yes [provider]  mycophenolate (CELLCEPT) 250 MG capsule Take 250 mg by mouth 2 (two) times daily.  07/13/17  Yes [provider]  NONFORMULARY OR COMPOUNDED ITEM [facility]:  Pain Cream - Ketamine 5%, Baclofen 2%, Gabapentin 5%, Lidocaine 5%, Menthol 1%, apply 1-2 grams to affected area 3-4 times a day for pain. 12/18/19  Yes Bronson Ing, DPM  nystatin (MYCOSTATIN) powder Apply 1 g topically 4 (four) times daily as needed. For yeast under breast   Yes [provider]  ondansetron (ZOFRAN ODT) 4 MG disintegrating tablet 63m ODT q4 hours prn nausea/vomiting Patient taking differently: Take 4 mg by mouth every 4 (four) hours as needed for nausea or vomiting. 06/15/15  Yes Mesner, JCorene Cornea MD  [facility] Inc[facility] LANCETS [id number] Use to check blood sugar 1 time per day 02/12/15  Yes ERenato Shin MD  Potassium Chloride ER 20 MEQ TBCR Take 1 tablet by mouth daily. NEEDS POTASSIUM RE-CHECK. 06/27/19  Yes JMartinique [name], MD  predniSONE (DELTASONE) 5 MG tablet Take 5 mg by mouth daily with breakfast.  07/13/17  Yes [provider]  psyllium (METAMUCIL) 58.6 % packet Take 1 packet by mouth daily.   Yes [provider]  sertraline (ZOLOFT) 100 MG tablet Take 0.5 tablets (50 mg total) by mouth daily. Patient taking differently: Take 100 mg by mouth daily. 01/25/17  Yes ERenato Shin MD  Skin Protectants, Misc. (EUCERIN) cream Apply 1 application topically daily. To feet   Yes [provider]  tacrolimus (PROGRAF) 1 MG capsule Take 2.5 mg by mouth every 12 (twelve) hours. 01/15/18  Yes [provider]  traMADol (ULTRAM) 50 MG tablet Take 100 mg by mouth every 6 (six) hours as needed for pain. 01/26/21  Yes [provider]  warfarin (COUMADIN) 5 MG tablet TAKE 1/2 TO 1 TABLET BY MOUTH [place]igaSUNDAY-2.5MG Patient taking differently: Take 6.5 mg by mouth daily. 03/27/19  Yes JMartinique  [name], MD  calcitRIOL (ROCALTROL) 0.25 MCG capsule TAKE 1 CAPSULE(0.25 MCG) BY MOUTH DAILY. NO REFILLS WITHOUT APPOINTMENT Patient not taking: No sig reported 02/13/19   ERenato Shin MD  morphine (MSIR) 15 MG tablet Take 0.5 tablets (7.5 mg total) by mouth every 6 (six) hours as needed for severe pain. Patient not taking: No sig  reported 08/28/20   Tacy Learn, PA-C  QUEtiapine (SEROQUEL) 25 MG tablet Take 1 tablet (25 mg total) by mouth at bedtime. Patient not taking: No sig reported 07/28/15   Metta Clines R, DO  sucralfate (CARAFATE) 1 g tablet Take 1 tablet (1 g total) by mouth 4 (four) times daily for 30 days. Patient not taking: Reported on 01/26/2021 08/22/18 09/11/19  Flora Lipps, MD    Physical Exam: Vitals:   01/26/21 2300 01/27/21 0430 01/27/21 0700 01/27/21 0751  BP: (!) 169/115 (!) 162/103 (!) 156/97   Pulse: (!) 122 (!) 128 (!) 119   Resp: 16 (!) 24 16   Temp:    98.2 F (36.8 C)  TempSrc:    Oral  SpO2: 100% 100% 93%     Constitutional: NAD, calm, comfortable, obese Eyes: PERRL, lids and conjunctivae normal ENMT: Mucous membranes are dry. Posterior pharynx clear of any exudate or lesions.Normal dentition.  Neck: normal, supple, no masses, no thyromegaly Respiratory: clear to auscultation bilaterally, no wheezing, no crackles. Normal respiratory effort. No accessory muscle use.  On room air Cardiovascular: Regular rate and rhythm, no murmurs / rubs / gallops. No extremity edema. 2+ pedal pulses. No carotid bruits.  Abdomen: Mild diffuse abdominal tenderness, no masses palpated. No hepatosplenomegaly. Bowel sounds positive.  Musculoskeletal: no clubbing / cyanosis. No joint deformity upper and lower extremities. Good ROM, no contractures. Normal muscle tone.  Skin: no rashes, lesions, ulcers. No induration Neurologic: CN 2-12 grossly intact. Sensation intact, DTR normal. Strength 5/5 in all 4.  Psychiatric: Normal judgment and insight. Alert and oriented x 3. Normal  mood.    Labs on Admission: I have personally reviewed following labs and imaging studies  CBC: Recent Labs  Lab 01/26/21 1859  WBC 12.4*  NEUTROABS 8.3*  HGB 14.4  HCT 46.6*  MCV 86.8  PLT 702   Basic Metabolic Panel: Recent Labs  Lab 01/26/21 1859  NA 142  K 3.8  CL 109  CO2 23  GLUCOSE 152*  BUN 16  CREATININE 0.60  CALCIUM 9.6   GFR: CrCl cannot be calculated (Unknown ideal weight.). Liver Function Tests: Recent Labs  Lab 01/26/21 1859  AST 15  ALT 14  ALKPHOS 67  BILITOT 0.9  PROT 7.4  ALBUMIN 3.6   Recent Labs  Lab 01/26/21 1859  LIPASE 27   No results for input(s): AMMONIA in the last 168 hours. Coagulation Profile: Recent Labs  Lab 01/26/21 2306  INR 2.3*   Cardiac Enzymes: No results for input(s): CKTOTAL, CKMB, CKMBINDEX, TROPONINI in the last 168 hours. BNP (last 3 results) No results for input(s): PROBNP in the last 8760 hours. HbA1C: No results for input(s): HGBA1C in the last 72 hours. CBG: No results for input(s): GLUCAP in the last 168 hours. Lipid Profile: No results for input(s): CHOL, HDL, LDLCALC, TRIG, CHOLHDL, LDLDIRECT in the last 72 hours. Thyroid Function Tests: Recent Labs    01/26/21 2306  TSH 0.547   Anemia Panel: No results for input(s): VITAMINB12, FOLATE, FERRITIN, TIBC, IRON, RETICCTPCT in the last 72 hours. Urine analysis:    Component Value Date/Time   COLORURINE YELLOW 01/26/2021 2156   APPEARANCEUR CLEAR 01/26/2021 2156   LABSPEC >1.030 (H) 01/26/2021 2156   PHURINE 6.0 01/26/2021 2156   GLUCOSEU NEGATIVE 01/26/2021 2156   GLUCOSEU NEGATIVE 10/22/2013 1621   HGBUR NEGATIVE 01/26/2021 2156   BILIRUBINUR MODERATE (A) 01/26/2021 2156   KETONESUR 40 (A) 01/26/2021 2156   PROTEINUR TRACE (A) 01/26/2021 2156  UROBILINOGEN 1.0 03/27/2015 1534   NITRITE NEGATIVE 01/26/2021 2156   LEUKOCYTESUR NEGATIVE 01/26/2021 2156    Radiological Exams on Admission: CT ABDOMEN PELVIS WO CONTRAST  Result Date:  01/26/2021 CLINICAL DATA:  Concern for bowel obstruction. EXAM: CT ABDOMEN AND PELVIS WITHOUT CONTRAST TECHNIQUE: Multidetector CT imaging of the abdomen and pelvis was performed following the standard protocol without IV contrast. COMPARISON:  CT abdomen pelvis dated 08/21/2020. FINDINGS: Evaluation of this exam is limited in the absence of intravenous contrast. Lower chest: Minimal bibasilar atelectasis. The visualized lung bases are otherwise clear. There is advanced 3 vessel coronary vascular calcification. No intra-abdominal free air or free fluid. Hepatobiliary: The liver is unremarkable. No intrahepatic biliary dilatation. Cholecystectomy. No retained calcified stone noted in the central CBD. Pancreas: Unremarkable. No pancreatic ductal dilatation or surrounding inflammatory changes. Spleen: Normal in size without focal abnormality. Adrenals/Urinary Tract: The adrenal glands are unremarkable. Atrophic native kidneys. Bilateral renal parenchyma nodularity similar to prior CT may represent residual renal parenchyma. A mass is less likely but not excluded. The visualized ureters appear unremarkable. The urinary bladder is collapsed. There is a right lower quadrant renal transplant. There is no hydronephrosis or nephrolithiasis of the transplant kidney. No peritransplant fluid collection. Stomach/Bowel: There is diffuse colonic diverticulosis without active inflammatory changes. There is mild thickened appearance of the jejunal folds with engorgement of the associated mesentery. Clinical correlation is recommended to evaluate for possibility of enteritis. There is no bowel obstruction. The appendix is normal. Vascular/Lymphatic: Moderate aortoiliac atherosclerotic disease. The IVC is unremarkable. No portal venous gas. There is no adenopathy. Reproductive: The uterus is grossly unremarkable. No adnexal masses. Other: Mild diffuse subcutaneous stranding.  No fluid collection. Musculoskeletal: Osteopenia with  degenerative changes of the spine. Multiple old left rib fractures. No acute osseous pathology. IMPRESSION: 1. Mild thickened appearance of the jejunal folds with engorgement of the associated mesentery. Clinical correlation is recommended to evaluate for possibility of enteritis. No bowel obstruction. Normal appendix. 2. Colonic diverticulosis. 3. Atrophic native kidneys with a right lower quadrant renal transplant. No hydronephrosis or nephrolithiasis. 4. Aortic Atherosclerosis (ICD10-I70.0). Electronically Signed   By: Anner Crete M.D.   On: 01/26/2021 20:23   DG Chest 2 View  Result Date: 01/26/2021 CLINICAL DATA:  Lower abdominal pain since Friday, tachycardia EXAM: CHEST - 2 VIEW COMPARISON:  08/28/2020 FINDINGS: Frontal and lateral views of the chest demonstrate an unremarkable cardiac silhouette. No acute airspace disease, effusion, or pneumothorax. No acute bony abnormalities. IMPRESSION: 1. No acute intrathoracic process. Electronically Signed   By: Randa Ngo M.D.   On: 01/26/2021 22:17    EKG: Independently reviewed.   Assessment/Plan Principal Problem:   Enteritis Active Problems:   Essential hypertension   Chronic diastolic congestive heart failure (HCC)   LUPUS   KIDNEY TRANSPLANTATION, HX OF   DVT (deep venous thrombosis) (HCC)   Hyperlipidemia   Type 2 diabetes mellitus (HCC)   Sinus tachycardia   SLE (systemic lupus erythematosus related syndrome) (HCC)   Hypothyroidism   Deceased-donor kidney transplant recipient   Enteritis Patient presenting to ED with 5-day history of progressive abdominal discomfort associated with nausea and diarrhea.  Any recent antibiotic use, no dietary changes and no sick contacts; although lives in a skilled nursing facility.  Denies any known individuals at her facility with similar symptoms.  On arrival, elevated WBC count of 12.4.  Urinalysis unrevealing.  CT abdomen/pelvis without contrast with mild thickened jejunal folds with  engorgement of the associated mesentery consistent with  enteritis; no bowel obstruction, normal appendix. --Admit to observation --Check C. difficile, GI PCR panel --Ceftriaxone 2 g IV every 24 hours --Metronidazole 500 mg IV every 8 hours --NS at 131m/h --Clear liquid diet, advance as tolerates --Strict I's and O's, monitor amount of bowel movements daily --Repeat CBC in a.m.  Addendum  C. difficile PCR antigen positive, toxin negative.  Given her profuse diarrhea, living condition and SNF concern this is possibly likely C. difficile colitis. --Start vancomycin 125 mg p.o. every 6 hours --Discontinue metronidazole and ceftriaxone --GI PCR panel: Pending  Hx ESRD s/p renal transplant 2005 Renal function 0.60, stable. --Check tacrolimus level and mycophenolate level --Mycophenolate 250 mg p.o. twice daily --Tacrolimus 2.5 mg every 12 hours --Prednisone 5 mg p.o. daily --Closely monitor urinary output --Outpatient follow-up with nephrology  Essential hypertension Hx sinus tachycardia Heart rate elevated on arrival, HR 128.  Etiology likely secondary from dehydration from fluid loss with diarrhea versus history of inappropriate sinus tachycardia.  Patient received 2 L NS bolus on arrival.  D-dimer 0.33, within normal limits. --Restart home diltiazem CD3 160 mg p.o. daily --Losartan 100 mg p.o. daily --Monitor on telemetry  HLD: Atorvastatin 40 mg p.o. daily  GERD: Famotidine 20 mg p.o. twice daily, [place] 40 mg p.o. daily  Hypothyroidism --Check TSH given diarrhea --Continue levothyroxine 150 mcg p.o. daily  Depression/anxiety: Sertraline 100 mg p.o. daily  Hx DVT/PE/CVA Takes Coumadin for anticoagulation.  INR 2.3, therapeutic. --Pharmacy consulted for further dosing/monitoring Coumadin while inpatient  Insomnia: Mirtazapine 30 mg p.o. nightly  Systemic lupus erythematosus --Outpatient follow-up with PCP  [name] Declines any blood products if  needed    DVT prophylaxis:  warfarin (COUMADIN) tablet 6.5 mg  Code Status: Full code Family Communication: Updated patient's daughter, SDesigner, television/film setvia telephone this am [facility] called: None Admission status: Observation Level of care: Telemetry  At the point of initial evaluation, it is my clinical opinion that admission for OBSERVATION is reasonable and necessary because the patient's presenting complaints in the context of their chronic conditions represent sufficient risk of deterioration or significant morbidity to constitute reasonable grounds for close observation in the hospital setting, but that the patient may be medically stable for discharge from the hospital within 24 to 48 hours.    Fenris Cauble J ABrit[place]DO [facility] Available via Epic secure chat 7am-7pm After these hours, please refer to coverage provider listed on [email or web address] 01/27/2021, 9:24 AM

## 2021-01-27 NOTE — Progress Notes (Signed)
ANTICOAGULATION CONSULT NOTE - Initial Consult  Pharmacy Consult for Warfarin Indication: pulmonary embolus, DVT, and CVA  Allergies  Allergen Reactions   Oxycodone-Acetaminophen Shortness Of Breath and Nausea Only   Propoxyphene Nausea Only and Shortness Of Breath    States takes tylenol at home   Propoxyphene N-Acetaminophen Shortness Of Breath and Nausea Only   Sulfonamide Derivatives Shortness Of Breath and Nausea Only   Codeine Nausea Only   Hydrocodone-Acetaminophen    Hydromorphone    Other Other (See Comments)    No blood, Jehovaeh Witness    Oxycodone    Sulfamethoxazole    Tape     Redness**PAPER TAPE OK**   Gabapentin Anxiety    twitching   Latex Rash   Metoprolol Rash   Morphine And Related Rash    IV site on arm is red, patient reports this is improving.  NO shortness of breath reported.   Rosiglitazone Rash    Patient Measurements:    Vital Signs: BP: 156/97 (08/23 0700) Pulse Rate: 119 (08/23 0700)  Labs: Recent Labs    01/26/21 1859 01/26/21 2306  HGB 14.4  --   HCT 46.6*  --   PLT 334  --   LABPROT  --  25.4*  INR  --  2.3*  CREATININE 0.60  --     CrCl cannot be calculated (Unknown ideal weight.).   Medical History: Past Medical History:  Diagnosis Date   Anxiety    Candida esophagitis ([place]) 11/12/2014   CEREBROVASCULAR ACCIDENT, ACUTE 04/15/2010   CLOSTRIDIUM DIFFICILE COLITIS, HX OF 08/21/2007   CONGESTIVE HEART FAILURE 08/21/2007   Current use of long term anticoagulation    Dr. Andree Elk, [facility]   CVA 04/17/2010   Depression    Dr. Andree Elk, [place], TYPE II 08/21/2007   DVT, HX OF 08/21/2007   GERD 08/21/2007   GOUT 08/21/2007   History of stroke with residual effects    HYPERLIPIDEMIA 08/21/2007   HYPERTENSION 08/21/2007   Dr. Andree Elk, [place], HX OF 08/22/2007   s/p renal transplant-Dr. Bonney Leitz   LUPUS 08/21/2007   OSTEOPOROSIS 08/21/2007   Rheumatol at [facility]   Pulmonary embolism  ([place]) 07/16/2010   Renal failure    RENAL INSUFFICIENCY 08/21/2007   Right sided weakness    Steroid-induced hyperglycemia 11/09/2014   Tachycardia    THYROID NODULE, LEFT 04/10/2009    Medications:  (Not in a hospital admission)  Scheduled:  Infusions:   metronidazole 500 mg (01/27/21 0331)   PRN:   Assessment: 60 yo female on chronic warfarin for hx DVT/PE, CVA admitted with abdominal pain.  Pharmacy consulted to continue warfarin dosing inpatient.  INR therapeutic on admission (2.4)  PTA dose of warfarin reported as 6.5mg  daily (last dose 8/21) per NH records.  Goal of Therapy:  INR 2-3   Plan:  Warfarin 6.5mg  PO x 1 today (note potential for metronidazole to increase warfarin sensitivity) Daily PT/INR Monitor for signs/symptoms of bleeding  Peggyann Juba, PharmD, BCPS Pharmacy: [phone] 01/27/2021,7:43 AM

## 2021-01-27 NOTE — Progress Notes (Signed)
   01/27/21 1151  Assess: MEWS Score  Temp 99.1 F (37.3 C)  BP (!) 153/100  Pulse Rate (!) 124  Resp 18  Level of Consciousness Alert  SpO2 98 %  O2 Device Room Air  Assess: MEWS Score  MEWS Temp 0  MEWS Systolic 0  MEWS Pulse 2  MEWS RR 0  MEWS LOC 0  MEWS Score 2  MEWS Score Color Yellow  Assess: if the MEWS score is Yellow or Red  Were vital signs taken at a resting state? Yes  Focused Assessment No change from prior assessment  Does the patient meet 2 or more of the SIRS criteria? No  MEWS guidelines implemented *See Row Information* Yes  Document  Patient Outcome Not stable and remains on department  Assess: SIRS CRITERIA  SIRS Temperature  0  SIRS Pulse 1  SIRS Respirations  0  SIRS WBC 0  SIRS Score Sum  1  Pt continues to be in yellow MEWS. This RN implemented yellow MEWS protocol. Charge RN notified Pamala Hurry, RN) and Garment/textile technologist, Therapist, sports) aware.

## 2021-01-27 NOTE — ED Provider Notes (Signed)
1:00 AM Case discussed with Dr. Marlowe Sax who will admit.  There is concern regarding the patient's on going tachycardia.  This has not responded to 2 L IV fluids.  While she does have a leukocytosis today, she is on chronic steroids.  Also historically with leukocytosis in the past which is largely unchanged from prior.  She is afebrile.  Unable to definitively exclude SIRS/sepsis, thus will add on blood cultures.  Initial lactate was reassuring.  She will be started on Rocephin and Flagyl given CT findings of enteritis.  Pulmonary embolus was considered given persistent tachycardia and history; however, the patient has been compliant with her Coumadin and is therapeutic today.  She has not been hypoxic and has no complaints of chest pain or shortness of breath during my assessment.  Does have bilateral lower quadrant abdominal tenderness without peritoneal signs, but super morbidly obese abdomen can limit exam.  Patient agreeable with plan for admission.   Vitals:   01/26/21 1930 01/26/21 1935 01/26/21 2100 01/26/21 2300  BP: (!) 154/113 (!) 154/113 (!) 152/116 (!) 169/115  Pulse: (!) 121 (!) 119 (!) 125 (!) 122  Resp: 17 16 16 16   Temp:      TempSrc:      SpO2: 100% 100% 100% 100%      Antonietta Breach, PA-C 01/27/21 0129    Palumbo, April, MD 01/27/21 [phone]

## 2021-01-28 DIAGNOSIS — Z940 Kidney transplant status: Secondary | ICD-10-CM | POA: Diagnosis not present

## 2021-01-28 DIAGNOSIS — I5032 Chronic diastolic (congestive) heart failure: Secondary | ICD-10-CM | POA: Diagnosis not present

## 2021-01-28 DIAGNOSIS — A0472 Enterocolitis due to Clostridium difficile, not specified as recurrent: Secondary | ICD-10-CM | POA: Diagnosis not present

## 2021-01-28 DIAGNOSIS — K529 Noninfective gastroenteritis and colitis, unspecified: Secondary | ICD-10-CM | POA: Diagnosis not present

## 2021-01-28 LAB — COMPREHENSIVE METABOLIC PANEL
ALT: 11 U/L (ref 0–44)
AST: 15 U/L (ref 15–41)
Albumin: 2.9 g/dL — ABNORMAL LOW (ref 3.5–5.0)
Alkaline Phosphatase: 50 U/L (ref 38–126)
Anion gap: 4 — ABNORMAL LOW (ref 5–15)
BUN: 12 mg/dL (ref 6–20)
CO2: 21 mmol/L — ABNORMAL LOW (ref 22–32)
Calcium: 8.6 mg/dL — ABNORMAL LOW (ref 8.9–10.3)
Chloride: 115 mmol/L — ABNORMAL HIGH (ref 98–111)
Creatinine, Ser: 0.53 mg/dL (ref 0.44–1.00)
GFR, Estimated: >60 mL/min (ref >60)
Glucose, Bld: 99 mg/dL (ref 70–99)
Potassium: 3.7 mmol/L (ref 3.5–5.1)
Sodium: 140 mmol/L (ref 135–145)
Total Bilirubin: 0.5 mg/dL (ref 0.3–1.2)
Total Protein: 5.8 g/dL — ABNORMAL LOW (ref 6.5–8.1)

## 2021-01-28 LAB — GLUCOSE, CAPILLARY
Glucose-Capillary: 109 mg/dL — ABNORMAL HIGH (ref 70–99)
Glucose-Capillary: 124 mg/dL — ABNORMAL HIGH (ref 70–99)
Glucose-Capillary: 134 mg/dL — ABNORMAL HIGH (ref 70–99)
Glucose-Capillary: 153 mg/dL — ABNORMAL HIGH (ref 70–99)

## 2021-01-28 LAB — CBC
HCT: 38 % (ref 36.0–46.0)
Hemoglobin: 11.3 g/dL — ABNORMAL LOW (ref 12.0–15.0)
MCH: 27 pg (ref 26.0–34.0)
MCHC: 29.7 g/dL — ABNORMAL LOW (ref 30.0–36.0)
MCV: 90.7 fL (ref 80.0–100.0)
Platelets: 254 10*3/uL (ref 150–400)
RBC: 4.19 MIL/uL (ref 3.87–5.11)
RDW: 15.3 % (ref 11.5–15.5)
WBC: 10.3 10*3/uL (ref 4.0–10.5)
nRBC: 0 % (ref 0.0–0.2)

## 2021-01-28 LAB — PROTIME-INR
INR: 3.1 — ABNORMAL HIGH (ref 0.8–1.2)
Prothrombin Time: 32.2 seconds — ABNORMAL HIGH (ref 11.4–15.2)

## 2021-01-28 MED ORDER — FENTANYL CITRATE PF 50 MCG/ML IJ SOSY
12.5000 ug | PREFILLED_SYRINGE | INTRAMUSCULAR | Status: DC | PRN
  Administered 2021-01-28: 50 ug via INTRAVENOUS
  Administered 2021-01-28 – 2021-01-29 (×3): 25 ug via INTRAVENOUS
  Filled 2021-01-28 (×4): qty 1

## 2021-01-28 NOTE — Progress Notes (Signed)
   01/28/21 0135  Vitals  Temp 98.3 F (36.8 C)  Temp Source Oral  BP (!) 152/91  MAP (mmHg) 109  BP Location Left Arm  BP Method Automatic  Patient Position (if appropriate) Lying  Pulse Rate (!) 117  Pulse Rate Source Monitor  Resp 18  MEWS COLOR  MEWS Score Color Yellow  Oxygen Therapy  SpO2 95 %  O2 Device Room Air  Pain Assessment  Pain Scale 0-10  MEWS Score  MEWS Temp 0  MEWS Systolic 0  MEWS Pulse 2  MEWS RR 0  MEWS LOC 0  MEWS Score 2

## 2021-01-28 NOTE — Evaluation (Signed)
Occupational Therapy Evaluation Patient Details Name: Connie Ruiz MRN: [id number] DOB: 1961-03-18 Today's Date: 01/28/2021    History of Present Illness Patient is a 60 year old female admitted from SNF due to abdominal pain and diarrhea since 8/19. PMH includes previous ESRD s/p renal transplant 2005, hypothyroidism, gout, HOCM, GERD, HLD, DM2, HTN, SLE, DVT   Clinical Impression   Patient reports she has been bed bound for about 6 months at SNF, staff will use hoyer lift to get to wheelchair "sometimes." Has assist from staff for dressing, bathing, toileting at bed level. Patient at baseline, no further acute OT needs and will sign off.     Follow Up Recommendations  Other (comment) (return to prior level of care)    Equipment Recommendations  None recommended by OT           Precautions / Restrictions Precautions Precautions: Fall Restrictions Weight Bearing Restrictions: No      Mobility Bed Mobility Overal bed mobility: Needs Assistance Bed Mobility: Rolling Rolling: Max assist;Total assist         General bed mobility comments: patient better able to assist rolling to her L however still needing significant assist due to body habitus and global weakness to roll.    Transfers                 General transfer comment: deferred; would need mechanical lift        ADL either performed or assessed with clinical judgement   ADL Overall ADL's : At baseline                                       General ADL Comments: has assist for all ADLs from nursing staff at baseline                         Pertinent Vitals/Pain Pain Assessment: Faces Faces Pain Scale: No hurt     Hand Dominance Left   Extremity/Trunk Assessment Upper Extremity Assessment Upper Extremity Assessment: Generalized weakness   Lower Extremity Assessment Lower Extremity Assessment: Defer to PT evaluation       Communication Communication Communication:  No difficulties   Cognition Arousal/Alertness: Awake/alert Behavior During Therapy: WFL for tasks assessed/performed Overall Cognitive Status: No family/caregiver present to determine baseline cognitive functioning                                 General Comments: oriented to year, initially stating shes at [facility] then when corrected to hospital states "oh yeah" cannot name month              Home Living Family/patient expects to be discharged to:: Skilled nursing facility                                 Additional Comments: [facility]      Prior Functioning/Environment Level of Independence: Needs assistance  Gait / Transfers Assistance Needed: patient reports being bed bound for past 6 months, staff will sometimes use hoyer lift to get her OOB to wheelchair ADL's / Homemaking Assistance Needed: has assist for all ADLs            OT Problem List: Obesity  OT Goals(Current goals can be found in the care plan section) Acute Rehab OT Goals Patient Stated Goal: feel better OT Goal Formulation: All assessment and education complete, DC therapy   AM-PAC OT "6 Clicks" Daily Activity     Outcome Measure Help from another person eating meals?: A Little Help from another person taking care of personal grooming?: A Little Help from another person toileting, which includes using toliet, bedpan, or urinal?: Total Help from another person bathing (including washing, rinsing, drying)?: Total Help from another person to put on and taking off regular upper body clothing?: Total Help from another person to put on and taking off regular lower body clothing?: Total 6 Click Score: 10   End of Session Nurse Communication: Mobility status  Activity Tolerance: Patient limited by fatigue Patient left: in bed;with call bell/phone within reach;with bed alarm set  OT Visit Diagnosis: Muscle weakness (generalized) (M62.81)                Time:  2583-4621 OT Time Calculation (min): 15 min Charges:  OT General Charges $OT Visit: 1 Visit OT Evaluation $OT Eval Low Complexity: [place] OT OT pager: [place] 01/28/2021, 10:56 AM

## 2021-01-28 NOTE — Progress Notes (Signed)
PT Cancellation Note  Patient Details Name: BLANDINA RENALDO MRN: [id number] DOB: May 07, 1961   Cancelled Treatment:    Reason Eval/Treat Not Completed: Other (comment) (pt refused- pt having issues with abdominal pain and diarrhea). PT will follow up as schedule allows.    Festus Barren PT, DPT  Acute Rehabilitation Services  Office [phone]  01/28/2021, 4:15 PM

## 2021-01-28 NOTE — Progress Notes (Signed)
[facility]                                                                              Patient Demographics  Connie Ruiz, is a 60 y.o. female, DOB - 1961/05/12, MAU:[id number]  Admit date - 01/26/2021   Admitting Physician Donnamarie Poag [place] DO  Outpatient Primary MD for the patient is [name], [name], MD  Outpatient specialists:   LOS - 1  days   Medical records reviewed and are as summarized below:    Chief Complaint  Patient presents with   Abdominal Pain       Brief summary   Patient is 60 year old female, [name], ESRD, now status post renal transplant in 2005, hypothyroidism, gout, GERD, hyperlipidemia, diabetes mellitus type 2, SLE, DVT on warfarin presented from [facility] with abdominal pain and diarrhea for 5 days prior to admission.  Patient reported roughly 7 episodes in a day, watery, no hematochezia or melena.  Also reported generalized weakness, fatigue, no fevers or chills. C. difficile positive.  CT abdomen pelvis showed enteritis.  Assessment & Plan    Principal Problem: Acute enteritis likely due to C. difficile colitis -Presented with profuse diarrhea, generalized weakness, abdominal pain - C. difficile PCR  Ag positive but toxin negative, high suspicion for C. difficile colitis, hence will treat -Continue oral vancomycin -Will advance diet to full liquid diet, continue IV fluid hydration   Active Problems: History of ESRD status post renal transplant, 2005 -Creatinine function stable -Continue mycophenolate, tacrolimus, pharmacy following  Essential hypertension, sinus tachycardia -Likely due to dehydration from fluid loss with diarrhea, C. difficile colitis -Continue Cardizem 360 mg daily, Cozaar, losartan  GERD -Continue PPI, famotidine  Hypothyroidism -Continue levothyroxine 150 MCG daily, TSH 0.5  Diabetes mellitus, type II, IDDM -Hemoglobin A1c 8.2 -CBGs controlled, continue sliding scale  insulin  Hyperlipidemia -Continue atorvastatin  Depression/anxiety -Continue sertraline  History of DVT/PE/CVA -Patient on Coumadin for anticoagulation, pharmacy following   History of [name] -Declines any blood products if needed  Obesity Estimated body mass index is 34.89 kg/m as calculated from the following:   Height as of this encounter: 5\' 4"  (1.626 m).   Weight as of this encounter: 92.2 kg.  Code Status: Full CODE STATUS DVT Prophylaxis:    Warfarin   Level of Care: Level of care: Telemetry Family Communication: Discussed all imaging results, lab results, explained to the patient   Disposition Plan:     Status is: Inpatient  Remains inpatient appropriate because:Inpatient level of care appropriate due to severity of illness  Dispo: The patient is from: SNF              Anticipated d/c is to: SNF              Patient currently is not medically stable to d/c.  Once tolerating p.o. consistently, diarrhea improved, hopefully will discharge to SNF in next 24 to 48 hours   Difficult to place patient No      Time Spent in minutes   35 minutes  Procedures:  None  Consultants:   None  Antimicrobials:   Anti-infectives (From admission,  onward)    Start     Dose/Rate Route Frequency Ordered Stop   01/27/21 2200  cefTRIAXone (ROCEPHIN) 2 g in sodium chloride 0.9 % 100 mL IVPB  Status:  Discontinued        2 g 200 mL/hr over 30 Minutes Intravenous Every 24 hours 01/27/21 0956 01/27/21 1136   01/27/21 1200  vancomycin (VANCOCIN) capsule 125 mg        125 mg Oral 4 times daily 01/27/21 1135 02/06/21 1359   01/27/21 1000  metroNIDAZOLE (FLAGYL) IVPB 500 mg  Status:  Discontinued        500 mg 100 mL/hr over 60 Minutes Intravenous Every 12 hours 01/27/21 0901 01/27/21 1135   01/27/21 0115  cefTRIAXone (ROCEPHIN) 2 g in sodium chloride 0.9 % 100 mL IVPB        2 g 200 mL/hr over 30 Minutes Intravenous  Once 01/27/21 0103 01/27/21 0411   01/27/21 0115   metroNIDAZOLE (FLAGYL) IVPB 500 mg  Status:  Discontinued        500 mg 100 mL/hr over 60 Minutes Intravenous Every 8 hours 01/27/21 0103 01/27/21 0901          Medications  Scheduled Meds:  atorvastatin  40 mg Oral q1800   diltiazem  360 mg Oral Daily   donepezil  10 mg Oral QHS   famotidine  20 mg Oral BID   furosemide  20 mg Oral Daily   gabapentin  300 mg Oral BID   insulin aspart  0-5 Units Subcutaneous QHS   insulin aspart  0-9 Units Subcutaneous TID WC   levothyroxine  150 mcg Oral QAC breakfast   losartan  100 mg Oral Daily   magnesium oxide  800 mg Oral Daily   mirtazapine  30 mg Oral QHS   mycophenolate  250 mg Oral BID   pantoprazole  40 mg Oral Daily   predniSONE  5 mg Oral Q breakfast   sertraline  100 mg Oral Daily   tacrolimus  2.5 mg Oral Q12H   vancomycin  125 mg Oral QID   Warfarin - Pharmacist Dosing Inpatient   Does not apply q1600   Continuous Infusions:  sodium chloride 125 mL/hr at 01/28/21 1225   PRN Meds:.acetaminophen **OR** acetaminophen, fentaNYL (SUBLIMAZE) injection, hydrALAZINE, ondansetron **OR** ondansetron (ZOFRAN) IV, polyethylene glycol, traMADol      Subjective:   Connie Ruiz was seen and examined today.  Still having abdominal pain, nausea, diarrhea.  States diarrhea 3-4 times last night.  Patient denies dizziness, chest pain, shortness of breath, new weakness, numbess, tingling. No acute events overnight.    Objective:   Vitals:   01/27/21 2212 01/28/21 0135 01/28/21 0607 01/28/21 0938  BP: (!) 156/103 (!) 152/91 (!) 136/98 (!) 155/98  Pulse: (!) 113 (!) 117 (!) 113 (!) 101  Resp:  18 18 20   Temp:  98.3 F (36.8 C) 98.2 F (36.8 C) 98.4 F (36.9 C)  TempSrc:  Oral Oral   SpO2:  95% 97% 96%  Weight:      Height:        Intake/Output Summary (Last 24 hours) at 01/28/2021 1445 Last data filed at 01/28/2021 1341 Gross per 24 hour  Intake 695 ml  Output 500 ml  Net 195 ml     Wt Readings from Last 3 Encounters:   01/27/21 92.2 kg  08/21/20 88.9 kg  09/11/19 78.9 kg     Exam General: Alert and oriented x 3, NAD Cardiovascular: S1 S2 auscultated,  no murmurs, RRR Respiratory: Clear to auscultation bilaterally, no wheezing, rales or rhonchi Gastrointestinal: Soft, mild diffuse TTP  nondistended, + bowel sounds Ext: no pedal edema bilaterally Neuro: no new deficits Musculoskeletal: No digital cyanosis, clubbing Skin: No rashes Psych: Normal affect and demeanor, alert and oriented x3    Data Reviewed:  I have personally reviewed following labs and imaging studies  Micro Results Recent Results (from the past 240 hour(s))  Resp Panel by RT-PCR (Flu A&B, Covid) Nasopharyngeal Swab     Status: None   Collection Time: 01/26/21  9:56 PM   Specimen: Nasopharyngeal Swab; Nasopharyngeal(NP) swabs in vial transport medium  Result Value Ref Range Status   SARS Coronavirus 2 by RT PCR NEGATIVE NEGATIVE Final    Comment: (NOTE) SARS-CoV-2 target nucleic acids are NOT DETECTED.  The SARS-CoV-2 RNA is generally detectable in upper respiratory specimens during the acute phase of infection. The lowest concentration of SARS-CoV-2 viral copies this assay can detect is 138 copies/mL. A negative result does not preclude SARS-Cov-2 infection and should not be used as the sole basis for treatment or other patient management decisions. A negative result may occur with  improper specimen collection/handling, submission of specimen other than nasopharyngeal swab, presence of viral mutation(s) within the areas targeted by this assay, and inadequate number of viral copies(<138 copies/mL). A negative result must be combined with clinical observations, patient history, and epidemiological information. The expected result is Negative.  Fact Sheet for Patients:  EntrepreneurPulse.com.au  Fact Sheet for Healthcare Providers:  IncredibleEmployment.be  This test is no t yet  approved or cleared by the [place] FDA and  has been authorized for detection and/or diagnosis of SARS-CoV-2 by FDA under an Emergency Use Authorization (EUA). This EUA will remain  in effect (meaning this test can be used) for the duration of the COVID-19 declaration under Section 564(b)(1) of the Act, 21 U.S.C.section 360bbb-3(b)(1), unless the authorization is terminated  or revoked sooner.       Influenza A by PCR NEGATIVE NEGATIVE Final   Influenza B by PCR NEGATIVE NEGATIVE Final    Comment: (NOTE) The Xpert Xpress SARS-CoV-2/FLU/RSV plus assay is intended as an aid in the diagnosis of influenza from Nasopharyngeal swab specimens and should not be used as a sole basis for treatment. Nasal washings and aspirates are unacceptable for Xpert Xpress SARS-CoV-2/FLU/RSV testing.  Fact Sheet for Patients: EntrepreneurPulse.com.au  Fact Sheet for Healthcare Providers: IncredibleEmployment.be  This test is not yet approved or cleared by the [place] FDA and has been authorized for detection and/or diagnosis of SARS-CoV-2 by FDA under an Emergency Use Authorization (EUA). This EUA will remain in effect (meaning this test can be used) for the duration of the COVID-19 declaration under Section 564(b)(1) of the Act, 21 U.S.C. section 360bbb-3(b)(1), unless the authorization is terminated or revoked.  Performed at [facility], [place] [address], [place], [place] [address]   Blood culture (routine x 2)     Status: None (Preliminary result)   Collection Time: 01/27/21 12:32 AM   Specimen: Left Antecubital; Blood  Result Value Ref Range Status   Specimen Description   Final    LEFT ANTECUBITAL Performed at [facility] [address], [place], [place] [address]    Special Requests   Final    BOTTLES DRAWN AEROBIC AND ANAEROBIC Blood Culture adequate volume Performed at [facility] [address], [place], [place] [address]    Culture   Final  NO GROWTH 1 DAY Performed at [facility], [place] [address], [place], [place] [address]    Report Status PENDING  Incomplete  Blood culture (routine x 2)     Status: None (Preliminary result)   Collection Time: 01/27/21  3:21 AM   Specimen: Left Antecubital; Blood  Result Value Ref Range Status   Specimen Description   Final    LEFT ANTECUBITAL Performed at [facility] [address], [place], [place] [address]    Special Requests   Final    BOTTLES DRAWN AEROBIC AND ANAEROBIC Blood Culture results may not be optimal due to an excessive volume of blood received in culture bottles Performed at [facility] [address] [place], [place] [address]    Culture   Final    NO GROWTH 1 DAY Performed at [facility], [place] [address] [place], [place] [address]    Report Status PENDING  Incomplete  Gastrointestinal Panel by PCR , Stool     Status: None   Collection Time: 01/27/21  6:33 AM   Specimen: Stool  Result Value Ref Range Status   Campylobacter species NOT DETECTED NOT DETECTED Final   Plesimonas shigelloides NOT DETECTED NOT DETECTED Final   Salmonella species NOT DETECTED NOT DETECTED Final   Yersinia enterocolitica NOT DETECTED NOT DETECTED Final   Vibrio species NOT DETECTED NOT DETECTED Final   Vibrio cholerae NOT DETECTED NOT DETECTED Final   Enteroaggregative E coli (EAEC) NOT DETECTED NOT DETECTED Final   Enteropathogenic E coli (EPEC) NOT DETECTED NOT DETECTED Final   Enterotoxigenic E coli (ETEC) NOT DETECTED NOT DETECTED Final   Shiga like toxin producing E coli (STEC) NOT DETECTED NOT DETECTED Final   Shigella/Enteroinvasive E coli (EIEC) NOT DETECTED NOT DETECTED Final   Cryptosporidium NOT DETECTED NOT DETECTED Final   Cyclospora cayetanensis NOT DETECTED NOT DETECTED Final   Entamoeba histolytica NOT DETECTED NOT DETECTED Final   Giardia lamblia  NOT DETECTED NOT DETECTED Final   Adenovirus F40/41 NOT DETECTED NOT DETECTED Final   Astrovirus NOT DETECTED NOT DETECTED Final   Norovirus GI/GII NOT DETECTED NOT DETECTED Final   Rotavirus A NOT DETECTED NOT DETECTED Final   Sapovirus (I, II, IV, and V) NOT DETECTED NOT DETECTED Final    Comment: Performed at [facility], [address] [place], [place] [address]  C Difficile Quick Screen w PCR reflex     Status: Abnormal   Collection Time: 01/27/21  6:33 AM   Specimen: Stool  Result Value Ref Range Status   C Diff antigen POSITIVE (A) NEGATIVE Final   C Diff toxin NEGATIVE NEGATIVE Final   C Diff interpretation Results are indeterminate. See PCR results.  Final    Comment: Performed at [facility], [place] [address] [place], [place] [address]  C. Diff by PCR, Reflexed     Status: Abnormal   Collection Time: 01/27/21  6:33 AM  Result Value Ref Range Status   Toxigenic C. Difficile by PCR POSITIVE (A) NEGATIVE Final    Comment: Positive for toxigenic C. difficile with little to no toxin production. Only treat if clinical presentation suggests symptomatic illness. Performed at [facility], [place] [address] [place],  [address]     Radiology Reports CT ABDOMEN PELVIS WO CONTRAST  Result Date: 01/26/2021 CLINICAL DATA:  Concern for bowel obstruction. EXAM: CT ABDOMEN AND PELVIS WITHOUT CONTRAST TECHNIQUE: Multidetector CT imaging of the abdomen and pelvis was  performed following the standard protocol without IV contrast. COMPARISON:  CT abdomen pelvis dated 08/21/2020. FINDINGS: Evaluation of this exam is limited in the absence of intravenous contrast. Lower chest: Minimal bibasilar atelectasis. The visualized lung bases are otherwise clear. There is advanced 3 vessel coronary vascular calcification. No intra-abdominal free air or free fluid. Hepatobiliary: The liver is unremarkable. No intrahepatic biliary dilatation. Cholecystectomy. No retained  calcified stone noted in the central CBD. Pancreas: Unremarkable. No pancreatic ductal dilatation or surrounding inflammatory changes. Spleen: Normal in size without focal abnormality. Adrenals/Urinary Tract: The adrenal glands are unremarkable. Atrophic native kidneys. Bilateral renal parenchyma nodularity similar to prior CT may represent residual renal parenchyma. A mass is less likely but not excluded. The visualized ureters appear unremarkable. The urinary bladder is collapsed. There is a right lower quadrant renal transplant. There is no hydronephrosis or nephrolithiasis of the transplant kidney. No peritransplant fluid collection. Stomach/Bowel: There is diffuse colonic diverticulosis without active inflammatory changes. There is mild thickened appearance of the jejunal folds with engorgement of the associated mesentery. Clinical correlation is recommended to evaluate for possibility of enteritis. There is no bowel obstruction. The appendix is normal. Vascular/Lymphatic: Moderate aortoiliac atherosclerotic disease. The IVC is unremarkable. No portal venous gas. There is no adenopathy. Reproductive: The uterus is grossly unremarkable. No adnexal masses. Other: Mild diffuse subcutaneous stranding.  No fluid collection. Musculoskeletal: Osteopenia with degenerative changes of the spine. Multiple old left rib fractures. No acute osseous pathology. IMPRESSION: 1. Mild thickened appearance of the jejunal folds with engorgement of the associated mesentery. Clinical correlation is recommended to evaluate for possibility of enteritis. No bowel obstruction. Normal appendix. 2. Colonic diverticulosis. 3. Atrophic native kidneys with a right lower quadrant renal transplant. No hydronephrosis or nephrolithiasis. 4. Aortic Atherosclerosis (ICD10-I70.0). Electronically Signed   By: Anner Crete M.D.   On: 01/26/2021 20:23   DG Chest 2 View  Result Date: 01/26/2021 CLINICAL DATA:  Lower abdominal pain since Friday,  tachycardia EXAM: CHEST - 2 VIEW COMPARISON:  08/28/2020 FINDINGS: Frontal and lateral views of the chest demonstrate an unremarkable cardiac silhouette. No acute airspace disease, effusion, or pneumothorax. No acute bony abnormalities. IMPRESSION: 1. No acute intrathoracic process. Electronically Signed   By: Randa Ngo M.D.   On: 01/26/2021 22:17    Lab Data:  CBC: Recent Labs  Lab 01/26/21 1859 01/28/21 0519  WBC 12.4* 10.3  NEUTROABS 8.3*  --   HGB 14.4 11.3*  HCT 46.6* 38.0  MCV 86.8 90.7  PLT 334 384   Basic Metabolic Panel: Recent Labs  Lab 01/26/21 1859 01/28/21 0519  NA 142 140  K 3.8 3.7  CL 109 115*  CO2 23 21*  GLUCOSE 152* 99  BUN 16 12  CREATININE 0.60 0.53  CALCIUM 9.6 8.6*   GFR: Estimated Creatinine Clearance: 82.3 mL/min (by C-G formula based on SCr of 0.53 mg/dL). Liver Function Tests: Recent Labs  Lab 01/26/21 1859 01/28/21 0519  AST 15 15  ALT 14 11  ALKPHOS 67 50  BILITOT 0.9 0.5  PROT 7.4 5.8*  ALBUMIN 3.6 2.9*   Recent Labs  Lab 01/26/21 1859  LIPASE 27   No results for input(s): AMMONIA in the last 168 hours. Coagulation Profile: Recent Labs  Lab 01/26/21 2306 01/28/21 0519  INR 2.3* 3.1*   Cardiac Enzymes: No results for input(s): CKTOTAL, CKMB, CKMBINDEX, TROPONINI in the last 168 hours. BNP (last 3 results) No results for input(s): PROBNP in the last 8760 hours. HbA1C: Recent Labs  01/27/21 1009  HGBA1C 8.2*   CBG: Recent Labs  Lab 01/27/21 1153 01/27/21 1716 01/27/21 2339 01/28/21 0730 01/28/21 1122  GLUCAP 110* 149* 110* 109* 124*   Lipid Profile: No results for input(s): CHOL, HDL, LDLCALC, TRIG, CHOLHDL, LDLDIRECT in the last 72 hours. Thyroid Function Tests: Recent Labs    01/26/21 2306  TSH 0.547   Anemia Panel: No results for input(s): VITAMINB12, FOLATE, FERRITIN, TIBC, IRON, RETICCTPCT in the last 72 hours. Urine analysis:    Component Value Date/Time   COLORURINE YELLOW 01/26/2021  2156   APPEARANCEUR CLEAR 01/26/2021 2156   LABSPEC >1.030 (H) 01/26/2021 2156   PHURINE 6.0 01/26/2021 2156   GLUCOSEU NEGATIVE 01/26/2021 2156   GLUCOSEU NEGATIVE 10/22/2013 1621   HGBUR NEGATIVE 01/26/2021 2156   BILIRUBINUR MODERATE (A) 01/26/2021 2156   KETONESUR 40 (A) 01/26/2021 2156   PROTEINUR TRACE (A) 01/26/2021 2156   UROBILINOGEN 1.0 03/27/2015 1534   NITRITE NEGATIVE 01/26/2021 2156   LEUKOCYTESUR NEGATIVE 01/26/2021 2156     Connie Ruiz  M.D. [facility] 01/28/2021, 2:45 PM  Available via Epic secure chat 7am-7pm After 7 pm, please refer to night coverage provider listed on amion.

## 2021-01-28 NOTE — Progress Notes (Signed)
ANTICOAGULATION CONSULT NOTE - Follow Up  Pharmacy Consult for Warfarin Indication: pulmonary embolus, DVT, and CVA  Allergies  Allergen Reactions   Oxycodone-Acetaminophen Shortness Of Breath and Nausea Only   Propoxyphene Nausea Only and Shortness Of Breath    States takes tylenol at home   Propoxyphene N-Acetaminophen Shortness Of Breath and Nausea Only   Sulfonamide Derivatives Shortness Of Breath and Nausea Only   Codeine Nausea Only   Hydrocodone-Acetaminophen    Hydromorphone    Other Other (See Comments)    No blood, Jehovaeh Witness    Oxycodone    Sulfamethoxazole    Tape     Redness**PAPER TAPE OK**   Gabapentin Anxiety    twitching   Latex Rash   Metoprolol Rash   Morphine And Related Rash    IV site on arm is red, patient reports this is improving.  NO shortness of breath reported.   Rosiglitazone Rash    Patient Measurements: Height: 5\' 4"  (162.6 cm) Weight: 92.2 kg (203 lb 4.2 oz) IBW/kg (Calculated) : 54.7  Vital Signs: Temp: 98.4 F (36.9 C) (08/24 0938) Temp Source: Oral (08/24 0607) BP: 155/98 (08/24 0938) Pulse Rate: 101 (08/24 0938)  Labs: Recent Labs    01/26/21 1859 01/26/21 2306 01/28/21 0519  HGB 14.4  --  11.3*  HCT 46.6*  --  38.0  PLT 334  --  254  LABPROT  --  25.4* 32.2*  INR  --  2.3* 3.1*  CREATININE 0.60  --  0.53     Estimated Creatinine Clearance: 82.3 mL/min (by C-G formula based on SCr of 0.53 mg/dL).   Medical History: Past Medical History:  Diagnosis Date   Anxiety    Candida esophagitis ([place]) 11/12/2014   CEREBROVASCULAR ACCIDENT, ACUTE 04/15/2010   CLOSTRIDIUM DIFFICILE COLITIS, HX OF 08/21/2007   CONGESTIVE HEART FAILURE 08/21/2007   Current use of long term anticoagulation    Dr. Andree Elk, [facility]   CVA 04/17/2010   Depression    Dr. Andree Elk, [place], TYPE II 08/21/2007   DVT, HX OF 08/21/2007   GERD 08/21/2007   GOUT 08/21/2007   History of stroke with residual effects    HYPERLIPIDEMIA  08/21/2007   HYPERTENSION 08/21/2007   Dr. Andree Elk, [place], HX OF 08/22/2007   s/p renal transplant-Dr. Bonney Leitz   LUPUS 08/21/2007   OSTEOPOROSIS 08/21/2007   Rheumatol at [facility]   Pulmonary embolism ([place]) 07/16/2010   Renal failure    RENAL INSUFFICIENCY 08/21/2007   Right sided weakness    Steroid-induced hyperglycemia 11/09/2014   Tachycardia    THYROID NODULE, LEFT 04/10/2009    Medications: Pt on warfarin chronically PTA -Home dose: 6.5 mg PO daily (last dose 8/21 PTA) per nursing home MAR -INR on admission: 2.3 (therapeutic)  Assessment: Pt is a 25 yoF chronically on warfarin for a history of PE, DVT, and CVA. Pharmacy consulted to dose/monitor while inpatient.   Today, 01/28/21 INR = 3.1 is slightly supratherapeutic CBC: Hgb slightly low, decreased. Possibly hemodilutional, no bleeding reported. Plt WNL Diet: CLD DDI: No significant DDI with current active medications. Of note, pt received 2 doses of metronidazole on 8/23 which has significant DDI with warfarin which can result in increased INR  Goal of Therapy:  INR 2-3   Plan:  HOLD warfarin today given slightly elevated INR and significant increase from admission INR daily, CBC with AM labs tomorrow Monitor for signs of bleeding  Lenis Noon, PharmD 01/28/21 10:00  AM

## 2021-01-28 NOTE — Progress Notes (Signed)
PT CONTINUES TO BE IN THE yellow mews with elevated HR and B/P.

## 2021-01-29 ENCOUNTER — Inpatient Hospital Stay (HOSPITAL_COMMUNITY): Payer: Medicare Other

## 2021-01-29 DIAGNOSIS — I5032 Chronic diastolic (congestive) heart failure: Secondary | ICD-10-CM | POA: Diagnosis not present

## 2021-01-29 DIAGNOSIS — A0472 Enterocolitis due to Clostridium difficile, not specified as recurrent: Secondary | ICD-10-CM | POA: Diagnosis not present

## 2021-01-29 DIAGNOSIS — R109 Unspecified abdominal pain: Secondary | ICD-10-CM | POA: Diagnosis not present

## 2021-01-29 DIAGNOSIS — K529 Noninfective gastroenteritis and colitis, unspecified: Secondary | ICD-10-CM | POA: Diagnosis not present

## 2021-01-29 LAB — BASIC METABOLIC PANEL
Anion gap: 8 (ref 5–15)
BUN: 7 mg/dL (ref 6–20)
CO2: 20 mmol/L — ABNORMAL LOW (ref 22–32)
Calcium: 8.3 mg/dL — ABNORMAL LOW (ref 8.9–10.3)
Chloride: 113 mmol/L — ABNORMAL HIGH (ref 98–111)
Creatinine, Ser: 0.54 mg/dL (ref 0.44–1.00)
GFR, Estimated: >60 mL/min (ref >60)
Glucose, Bld: 97 mg/dL (ref 70–99)
Potassium: 3.6 mmol/L (ref 3.5–5.1)
Sodium: 141 mmol/L (ref 135–145)

## 2021-01-29 LAB — PROTIME-INR
INR: 3 — ABNORMAL HIGH (ref 0.8–1.2)
Prothrombin Time: 31.2 seconds — ABNORMAL HIGH (ref 11.4–15.2)

## 2021-01-29 LAB — LIPASE, BLOOD: Lipase: 26 U/L (ref 11–51)

## 2021-01-29 LAB — CBC
HCT: 45.2 % (ref 36.0–46.0)
Hemoglobin: 12.3 g/dL (ref 12.0–15.0)
MCH: 27.2 pg (ref 26.0–34.0)
MCHC: 27.2 g/dL — ABNORMAL LOW (ref 30.0–36.0)
MCV: 100 fL (ref 80.0–100.0)
Platelets: 262 10*3/uL (ref 150–400)
RBC: 4.52 MIL/uL (ref 3.87–5.11)
RDW: 15.5 % (ref 11.5–15.5)
WBC: 8 10*3/uL (ref 4.0–10.5)
nRBC: 0 % (ref 0.0–0.2)

## 2021-01-29 LAB — MAGNESIUM: Magnesium: 1.5 mg/dL — ABNORMAL LOW (ref 1.7–2.4)

## 2021-01-29 LAB — GLUCOSE, CAPILLARY
Glucose-Capillary: 95 mg/dL (ref 70–99)
Glucose-Capillary: 113 mg/dL — ABNORMAL HIGH (ref 70–99)
Glucose-Capillary: 155 mg/dL — ABNORMAL HIGH (ref 70–99)
Glucose-Capillary: 114 mg/dL — ABNORMAL HIGH (ref 70–99)

## 2021-01-29 LAB — TACROLIMUS LEVEL: Tacrolimus (FK506) - LabCorp: 5 ng/mL (ref 2.0–20.0)

## 2021-01-29 MED ORDER — POTASSIUM CHLORIDE 20 MEQ PO PACK
40.0000 meq | PACK | Freq: Once | ORAL | Status: AC
  Administered 2021-01-29: 40 meq via ORAL
  Filled 2021-01-29: qty 2

## 2021-01-29 MED ORDER — IOHEXOL 9 MG/ML PO SOLN
500.0000 mL | ORAL | Status: AC
  Administered 2021-01-29: 500 mL via ORAL

## 2021-01-29 MED ORDER — WARFARIN SODIUM 3 MG PO TABS
3.0000 mg | ORAL_TABLET | Freq: Once | ORAL | Status: AC
  Administered 2021-01-29: 3 mg via ORAL
  Filled 2021-01-29: qty 1

## 2021-01-29 MED ORDER — PROCHLORPERAZINE EDISYLATE 10 MG/2ML IJ SOLN
10.0000 mg | INTRAMUSCULAR | Status: DC | PRN
  Administered 2021-01-29: 10 mg via INTRAVENOUS
  Filled 2021-01-29: qty 2

## 2021-01-29 MED ORDER — FENTANYL CITRATE PF 50 MCG/ML IJ SOSY
25.0000 ug | PREFILLED_SYRINGE | INTRAMUSCULAR | Status: DC | PRN
  Administered 2021-01-29 – 2021-01-30 (×8): 25 ug via INTRAVENOUS
  Filled 2021-01-29 (×9): qty 1

## 2021-01-29 MED ORDER — SODIUM CHLORIDE 0.9 % IV SOLN: INTRAVENOUS | Status: DC

## 2021-01-29 MED ORDER — IOHEXOL 9 MG/ML PO SOLN
ORAL | Status: AC
  Administered 2021-01-29: 500 mL via ORAL
  Filled 2021-01-29: qty 1000

## 2021-01-29 NOTE — Progress Notes (Signed)
[facility]                                                                              Patient Demographics  Connie Ruiz, is a 60 y.o. female, DOB - 1961-01-29, GNF:[id number]  Admit date - 01/26/2021   Admitting Physician Donnamarie Poag [place] DO  Outpatient Primary MD for the patient is [name], [name], MD  Outpatient specialists:   LOS - 2  days   Medical records reviewed and are as summarized below:    Chief Complaint  Patient presents with   Abdominal Pain       Brief summary   Patient is 60 year old female, [name], ESRD, now status post renal transplant in 2005, hypothyroidism, gout, GERD, hyperlipidemia, diabetes mellitus type 2, SLE, DVT on warfarin presented from [facility] with abdominal pain and diarrhea for 5 days prior to admission.  Patient reported roughly 7 episodes in a day, watery, no hematochezia or melena.  Also reported generalized weakness, fatigue, no fevers or chills. C. difficile positive.  CT abdomen pelvis showed enteritis.  Assessment & Plan    Principal Problem: Acute enteritis likely due to C. difficile colitis, abdominal pain -Presented with profuse diarrhea, generalized weakness, abdominal pain - C. difficile PCR  Ag positive but toxin negative, high suspicion for C. difficile colitis, hence will treat -Continue oral vancomycin -Diet was advanced yesterday per patient's request to full liquids however having more nausea, abdominal pain today -Stat abdominal x-ray showed mild dilatation of the cecum, recommended CT to rule out obstruction. -Follow-up CT abdomen, continue n.p.o., pain control, antiemetics  Active Problems: History of ESRD status post renal transplant, 2005 -creatinine stable, 0.5 -Continue mycophenolate, tacrolimus, pharmacy following  Essential hypertension, sinus tachycardia -Likely due to dehydration from fluid loss with diarrhea, C. difficile colitis -Continue Cardizem 360 mg daily,  losartan, Lasix  GERD -Continue PPI, famotidine  Hypothyroidism -Continue levothyroxine 163mcg daily -TSH 0.5   Diabetes mellitus, type II, IDDM -Hemoglobin A1c 8.2 -CBGs controlled, continue sliding scale insulin  Hyperlipidemia -Continue atorvastatin  Depression/anxiety -Continue sertraline  History of DVT/PE/CVA -On Coumadin per pharmacy, awaiting CT abdomen results   History of [name] -Declines any blood products if needed  Obesity Estimated body mass index is 37.09 kg/m as calculated from the following:   Height as of this encounter: 5\' 4"  (1.626 m).   Weight as of this encounter: 98 kg.  Code Status: Full CODE STATUS DVT Prophylaxis:    Warfarin warfarin (COUMADIN) tablet 3 mg   Level of Care: Level of care: Telemetry Family Communication: Discussed all imaging results, lab results, explained to the patient   Disposition Plan:     Status is: Inpatient  Remains inpatient appropriate because:Inpatient level of care appropriate due to severity of illness  Dispo: The patient is from: SNF              Anticipated d/c is to: SNF              Patient currently is not medically stable to d/c.  N.p.o. secondary to ileus versus SBO, CT pending   Difficult to place patient No  Time Spent in minutes   25 minutes  Procedures:  None  Consultants:   None  Antimicrobials:   Anti-infectives (From admission, onward)    Start     Dose/Rate Route Frequency Ordered Stop   01/27/21 2200  cefTRIAXone (ROCEPHIN) 2 g in sodium chloride 0.9 % 100 mL IVPB  Status:  Discontinued        2 g 200 mL/hr over 30 Minutes Intravenous Every 24 hours 01/27/21 0956 01/27/21 1136   01/27/21 1200  vancomycin (VANCOCIN) capsule 125 mg        125 mg Oral 4 times daily 01/27/21 1135 02/06/21 1359   01/27/21 1000  metroNIDAZOLE (FLAGYL) IVPB 500 mg  Status:  Discontinued        500 mg 100 mL/hr over 60 Minutes Intravenous Every 12 hours 01/27/21 0901 01/27/21 1135    01/27/21 0115  cefTRIAXone (ROCEPHIN) 2 g in sodium chloride 0.9 % 100 mL IVPB        2 g 200 mL/hr over 30 Minutes Intravenous  Once 01/27/21 0103 01/27/21 0411   01/27/21 0115  metroNIDAZOLE (FLAGYL) IVPB 500 mg  Status:  Discontinued        500 mg 100 mL/hr over 60 Minutes Intravenous Every 8 hours 01/27/21 0103 01/27/21 0901          Medications  Scheduled Meds:  atorvastatin  40 mg Oral q1800   diltiazem  360 mg Oral Daily   donepezil  10 mg Oral QHS   famotidine  20 mg Oral BID   furosemide  20 mg Oral Daily   gabapentin  300 mg Oral BID   insulin aspart  0-5 Units Subcutaneous QHS   insulin aspart  0-9 Units Subcutaneous TID WC   levothyroxine  150 mcg Oral QAC breakfast   losartan  100 mg Oral Daily   magnesium oxide  800 mg Oral Daily   mirtazapine  30 mg Oral QHS   mycophenolate  250 mg Oral BID   predniSONE  5 mg Oral Q breakfast   tacrolimus  2.5 mg Oral Q12H   vancomycin  125 mg Oral QID   warfarin  3 mg Oral ONCE-1600   Warfarin - Pharmacist Dosing Inpatient   Does not apply q1600   Continuous Infusions:  sodium chloride 75 mL/hr at 01/29/21 1406   PRN Meds:.acetaminophen **OR** acetaminophen, fentaNYL (SUBLIMAZE) injection, hydrALAZINE, polyethylene glycol, prochlorperazine, traMADol      Subjective:   Connie Ruiz was seen and examined today.  Complaining of abdominal pain, worse than yesterday, nauseous.   Patient denies dizziness, chest pain, shortness of breath, new weakness, numbess, tingling. No acute events overnight.  No fevers.  Objective:   Vitals:   01/28/21 2059 01/29/21 0203 01/29/21 0511 01/29/21 1318  BP: 131/80  (!) 164/93 (!) 150/76  Pulse: 98  98 96  Resp: 20  20 17   Temp: 98.9 F (37.2 C)  98 F (36.7 C) 98.6 F (37 C)  TempSrc: Oral  Oral Oral  SpO2: 97%  97% 97%  Weight: 98 kg 98 kg    Height:        Intake/Output Summary (Last 24 hours) at 01/29/2021 1512 Last data filed at 01/29/2021 1008 Gross per 24 hour  Intake  3071.88 ml  Output --  Net 3071.88 ml     Wt Readings from Last 3 Encounters:  01/29/21 98 kg  08/21/20 88.9 kg  09/11/19 78.9 kg   Physical Exam General: Alert and oriented x 3, uncomfortable Cardiovascular:  S1 S2 clear, RRR. No pedal edema b/l Respiratory: CTAB, no wheezing, rales or rhonchi Gastrointestinal: Soft, diffuse TTP, nondistended, + BS. Ext: no pedal edema bilaterally Neuro: no new deficits Skin: No rashes Psych: anxious   Data Reviewed:  I have personally reviewed following labs and imaging studies  Micro Results Recent Results (from the past 240 hour(s))  Resp Panel by RT-PCR (Flu A&B, Covid) Nasopharyngeal Swab     Status: None   Collection Time: 01/26/21  9:56 PM   Specimen: Nasopharyngeal Swab; Nasopharyngeal(NP) swabs in vial transport medium  Result Value Ref Range Status   SARS Coronavirus 2 by RT PCR NEGATIVE NEGATIVE Final    Comment: (NOTE) SARS-CoV-2 target nucleic acids are NOT DETECTED.  The SARS-CoV-2 RNA is generally detectable in upper respiratory specimens during the acute phase of infection. The lowest concentration of SARS-CoV-2 viral copies this assay can detect is 138 copies/mL. A negative result does not preclude SARS-Cov-2 infection and should not be used as the sole basis for treatment or other patient management decisions. A negative result may occur with  improper specimen collection/handling, submission of specimen other than nasopharyngeal swab, presence of viral mutation(s) within the areas targeted by this assay, and inadequate number of viral copies(<138 copies/mL). A negative result must be combined with clinical observations, patient history, and epidemiological information. The expected result is Negative.  Fact Sheet for Patients:  EntrepreneurPulse.com.au  Fact Sheet for Healthcare Providers:  IncredibleEmployment.be  This test is no t yet approved or cleared by the [place]  FDA and  has been authorized for detection and/or diagnosis of SARS-CoV-2 by FDA under an Emergency Use Authorization (EUA). This EUA will remain  in effect (meaning this test can be used) for the duration of the COVID-19 declaration under Section 564(b)(1) of the Act, 21 U.S.C.section 360bbb-3(b)(1), unless the authorization is terminated  or revoked sooner.       Influenza A by PCR NEGATIVE NEGATIVE Final   Influenza B by PCR NEGATIVE NEGATIVE Final    Comment: (NOTE) The Xpert Xpress SARS-CoV-2/FLU/RSV plus assay is intended as an aid in the diagnosis of influenza from Nasopharyngeal swab specimens and should not be used as a sole basis for treatment. Nasal washings and aspirates are unacceptable for Xpert Xpress SARS-CoV-2/FLU/RSV testing.  Fact Sheet for Patients: EntrepreneurPulse.com.au  Fact Sheet for Healthcare Providers: IncredibleEmployment.be  This test is not yet approved or cleared by the [place] FDA and has been authorized for detection and/or diagnosis of SARS-CoV-2 by FDA under an Emergency Use Authorization (EUA). This EUA will remain in effect (meaning this test can be used) for the duration of the COVID-19 declaration under Section 564(b)(1) of the Act, 21 U.S.C. section 360bbb-3(b)(1), unless the authorization is terminated or revoked.  Performed at [facility], [place] [address], [place], [place] [address]   Blood culture (routine x 2)     Status: None (Preliminary result)   Collection Time: 01/27/21 12:32 AM   Specimen: Left Antecubital; Blood  Result Value Ref Range Status   Specimen Description   Final    LEFT ANTECUBITAL Performed at [facility] [address], [place], [place] [address]    Special Requests   Final    BOTTLES DRAWN AEROBIC AND ANAEROBIC Blood Culture adequate volume Performed at [facility] [address], [place], [place]  [address]    Culture   Final    NO GROWTH 2 DAYS Performed at [facility]  [address] [place], [place] [address]    Report Status PENDING  Incomplete  Blood culture (routine x 2)     Status: None (Preliminary result)   Collection Time: 01/27/21  3:21 AM   Specimen: Left Antecubital; Blood  Result Value Ref Range Status   Specimen Description   Final    LEFT ANTECUBITAL Performed at [facility] [address], [place], [place] [address]    Special Requests   Final    BOTTLES DRAWN AEROBIC AND ANAEROBIC Blood Culture results may not be optimal due to an excessive volume of blood received in culture bottles Performed at [facility] [address], [place], [place] [address]    Culture   Final    NO GROWTH 2 DAYS Performed at [facility] [address] [place], [place] [address]    Report Status PENDING  Incomplete  Gastrointestinal Panel by PCR , Stool     Status: None   Collection Time: 01/27/21  6:33 AM   Specimen: Stool  Result Value Ref Range Status   Campylobacter species NOT DETECTED NOT DETECTED Final   Plesimonas shigelloides NOT DETECTED NOT DETECTED Final   Salmonella species NOT DETECTED NOT DETECTED Final   Yersinia enterocolitica NOT DETECTED NOT DETECTED Final   Vibrio species NOT DETECTED NOT DETECTED Final   Vibrio cholerae NOT DETECTED NOT DETECTED Final   Enteroaggregative E coli (EAEC) NOT DETECTED NOT DETECTED Final   Enteropathogenic E coli (EPEC) NOT DETECTED NOT DETECTED Final   Enterotoxigenic E coli (ETEC) NOT DETECTED NOT DETECTED Final   Shiga like toxin producing E coli (STEC) NOT DETECTED NOT DETECTED Final   Shigella/Enteroinvasive E coli (EIEC) NOT DETECTED NOT DETECTED Final   Cryptosporidium NOT DETECTED NOT DETECTED Final   Cyclospora cayetanensis NOT DETECTED NOT DETECTED Final   Entamoeba histolytica NOT DETECTED NOT DETECTED Final   Giardia lamblia NOT DETECTED NOT DETECTED Final    Adenovirus F40/41 NOT DETECTED NOT DETECTED Final   Astrovirus NOT DETECTED NOT DETECTED Final   Norovirus GI/GII NOT DETECTED NOT DETECTED Final   Rotavirus A NOT DETECTED NOT DETECTED Final   Sapovirus (I, II, IV, and V) NOT DETECTED NOT DETECTED Final    Comment: Performed at [facility], [address] [place], [place] [address]  C Difficile Quick Screen w PCR reflex     Status: Abnormal   Collection Time: 01/27/21  6:33 AM   Specimen: Stool  Result Value Ref Range Status   C Diff antigen POSITIVE (A) NEGATIVE Final   C Diff toxin NEGATIVE NEGATIVE Final   C Diff interpretation Results are indeterminate. See PCR results.  Final    Comment: Performed at [facility], [place] [address], [place], [place] [address]  C. Diff by PCR, Reflexed     Status: Abnormal   Collection Time: 01/27/21  6:33 AM  Result Value Ref Range Status   Toxigenic C. Difficile by PCR POSITIVE (A) NEGATIVE Final    Comment: Positive for toxigenic C. difficile with little to no toxin production. Only treat if clinical presentation suggests symptomatic illness. Performed at [facility], [place] [address], [place], [place] [address]     Radiology Reports CT ABDOMEN PELVIS WO CONTRAST  Result Date: 01/26/2021 CLINICAL DATA:  Concern for bowel obstruction. EXAM: CT ABDOMEN AND PELVIS WITHOUT CONTRAST TECHNIQUE: Multidetector CT imaging of the abdomen and pelvis was performed following the standard protocol without IV contrast. COMPARISON:  CT abdomen  pelvis dated 08/21/2020. FINDINGS: Evaluation of this exam is limited in the absence of intravenous contrast. Lower chest: Minimal bibasilar atelectasis. The visualized lung bases are otherwise clear. There is advanced 3 vessel coronary vascular calcification. No intra-abdominal free air or free fluid. Hepatobiliary: The liver is unremarkable. No intrahepatic biliary dilatation. Cholecystectomy. No retained calcified stone noted in the central  CBD. Pancreas: Unremarkable. No pancreatic ductal dilatation or surrounding inflammatory changes. Spleen: Normal in size without focal abnormality. Adrenals/Urinary Tract: The adrenal glands are unremarkable. Atrophic native kidneys. Bilateral renal parenchyma nodularity similar to prior CT may represent residual renal parenchyma. A mass is less likely but not excluded. The visualized ureters appear unremarkable. The urinary bladder is collapsed. There is a right lower quadrant renal transplant. There is no hydronephrosis or nephrolithiasis of the transplant kidney. No peritransplant fluid collection. Stomach/Bowel: There is diffuse colonic diverticulosis without active inflammatory changes. There is mild thickened appearance of the jejunal folds with engorgement of the associated mesentery. Clinical correlation is recommended to evaluate for possibility of enteritis. There is no bowel obstruction. The appendix is normal. Vascular/Lymphatic: Moderate aortoiliac atherosclerotic disease. The IVC is unremarkable. No portal venous gas. There is no adenopathy. Reproductive: The uterus is grossly unremarkable. No adnexal masses. Other: Mild diffuse subcutaneous stranding.  No fluid collection. Musculoskeletal: Osteopenia with degenerative changes of the spine. Multiple old left rib fractures. No acute osseous pathology. IMPRESSION: 1. Mild thickened appearance of the jejunal folds with engorgement of the associated mesentery. Clinical correlation is recommended to evaluate for possibility of enteritis. No bowel obstruction. Normal appendix. 2. Colonic diverticulosis. 3. Atrophic native kidneys with a right lower quadrant renal transplant. No hydronephrosis or nephrolithiasis. 4. Aortic Atherosclerosis (ICD10-I70.0). Electronically Signed   By: Anner Crete M.D.   On: 01/26/2021 20:23   DG Chest 2 View  Result Date: 01/26/2021 CLINICAL DATA:  Lower abdominal pain since Friday, tachycardia EXAM: CHEST - 2 VIEW  COMPARISON:  08/28/2020 FINDINGS: Frontal and lateral views of the chest demonstrate an unremarkable cardiac silhouette. No acute airspace disease, effusion, or pneumothorax. No acute bony abnormalities. IMPRESSION: 1. No acute intrathoracic process. Electronically Signed   By: Randa Ngo M.D.   On: 01/26/2021 22:17   DG Abd 2 Views  Result Date: 01/29/2021 CLINICAL DATA:  Abdominal pain EXAM: ABDOMEN - 2 VIEW COMPARISON:  None. FINDINGS: There is mildly dilated of the cecum. Nondistended small bowel. No acute osseous abnormality. There are soft tissue calcifications consistent with subcutaneous calcification seen on prior CT. IMPRESSION: Mild dilation of the cecum. If there is concern for obstruction recommend CT. Electronically Signed   By: Maurine Simmering M.D.   On: 01/29/2021 09:25    Lab Data:  CBC: Recent Labs  Lab 01/26/21 1859 01/28/21 0519 01/29/21 0515  WBC 12.4* 10.3 8.0  NEUTROABS 8.3*  --   --   HGB 14.4 11.3* 12.3  HCT 46.6* 38.0 45.2  MCV 86.8 90.7 100.0  PLT 334 254 578   Basic Metabolic Panel: Recent Labs  Lab 01/26/21 1859 01/28/21 0519 01/29/21 0515  NA 142 140 141  K 3.8 3.7 3.6  CL 109 115* 113*  CO2 23 21* 20*  GLUCOSE 152* 99 97  BUN 16 12 7   CREATININE 0.60 0.53 0.54  CALCIUM 9.6 8.6* 8.3*  MG  --   --  1.5*   GFR: Estimated Creatinine Clearance: 85 mL/min (by C-G formula based on SCr of 0.54 mg/dL). Liver Function Tests: Recent Labs  Lab 01/26/21 1859 01/28/21 4696  AST 15 15  ALT 14 11  ALKPHOS 67 50  BILITOT 0.9 0.5  PROT 7.4 5.8*  ALBUMIN 3.6 2.9*   Recent Labs  Lab 01/26/21 1859 01/29/21 0515  LIPASE 27 26   No results for input(s): AMMONIA in the last 168 hours. Coagulation Profile: Recent Labs  Lab 01/26/21 2306 01/28/21 0519 01/29/21 0515  INR 2.3* 3.1* 3.0*   Cardiac Enzymes: No results for input(s): CKTOTAL, CKMB, CKMBINDEX, TROPONINI in the last 168 hours. BNP (last 3 results) No results for input(s): PROBNP in  the last 8760 hours. HbA1C: Recent Labs    01/27/21 1009  HGBA1C 8.2*   CBG: Recent Labs  Lab 01/28/21 1122 01/28/21 1654 01/28/21 2057 01/29/21 0731 01/29/21 1211  GLUCAP 124* 134* 153* 95 113*   Lipid Profile: No results for input(s): CHOL, HDL, LDLCALC, TRIG, CHOLHDL, LDLDIRECT in the last 72 hours. Thyroid Function Tests: Recent Labs    01/26/21 2306  TSH 0.547   Anemia Panel: No results for input(s): VITAMINB12, FOLATE, FERRITIN, TIBC, IRON, RETICCTPCT in the last 72 hours. Urine analysis:    Component Value Date/Time   COLORURINE YELLOW 01/26/2021 2156   APPEARANCEUR CLEAR 01/26/2021 2156   LABSPEC >1.030 (H) 01/26/2021 2156   PHURINE 6.0 01/26/2021 2156   GLUCOSEU NEGATIVE 01/26/2021 2156   GLUCOSEU NEGATIVE 10/22/2013 1621   HGBUR NEGATIVE 01/26/2021 2156   BILIRUBINUR MODERATE (A) 01/26/2021 2156   KETONESUR 40 (A) 01/26/2021 2156   PROTEINUR TRACE (A) 01/26/2021 2156   UROBILINOGEN 1.0 03/27/2015 1534   NITRITE NEGATIVE 01/26/2021 2156   LEUKOCYTESUR NEGATIVE 01/26/2021 2156     Dabney Schanz M.D. [facility] 01/29/2021, 3:12 PM  Available via Epic secure chat 7am-7pm After 7 pm, please refer to night coverage provider listed on amion.

## 2021-01-29 NOTE — Progress Notes (Signed)
PT Cancellation Note  Patient Details Name: NAKEMA FAKE MRN: [id number] DOB: Jan 29, 1961   Cancelled Treatment:    Reason Eval/Treat Not Completed: Medical issues which prohibited therapy;Pain limiting ability to participate (RN requested PT hold today, pt is in pain and is soon going for a CT scan. Will follow.)   Philomena Doheny PT 01/29/2021  Acute Rehabilitation Services Pager [phone] Office [phone]

## 2021-01-29 NOTE — Progress Notes (Signed)
ANTICOAGULATION CONSULT NOTE - Follow Up  Pharmacy Consult for Warfarin Indication: pulmonary embolus, DVT, and CVA  Allergies  Allergen Reactions   Oxycodone-Acetaminophen Shortness Of Breath and Nausea Only   Propoxyphene Nausea Only and Shortness Of Breath    States takes tylenol at home   Propoxyphene N-Acetaminophen Shortness Of Breath and Nausea Only   Sulfonamide Derivatives Shortness Of Breath and Nausea Only   Codeine Nausea Only   Hydrocodone-Acetaminophen    Hydromorphone    Other Other (See Comments)    No blood, Jehovaeh Witness    Oxycodone    Sulfamethoxazole    Tape     Redness**PAPER TAPE OK**   Gabapentin Anxiety    twitching   Latex Rash   Metoprolol Rash   Morphine And Related Rash    IV site on arm is red, patient reports this is improving.  NO shortness of breath reported.   Rosiglitazone Rash    Patient Measurements: Height: 5\' 4"  (162.6 cm) Weight: 98 kg (216 lb 0.8 oz) IBW/kg (Calculated) : 54.7  Vital Signs: Temp: 98 F (36.7 C) (08/25 0511) Temp Source: Oral (08/25 0511) BP: 164/93 (08/25 0511) Pulse Rate: 98 (08/25 0511)  Labs: Recent Labs    01/26/21 1859 01/26/21 2306 01/28/21 0519 01/29/21 0515  HGB 14.4  --  11.3*  --   HCT 46.6*  --  38.0  --   PLT 334  --  254  --   LABPROT  --  25.4* 32.2* 31.2*  INR  --  2.3* 3.1* 3.0*  CREATININE 0.60  --  0.53  --      Estimated Creatinine Clearance: 85 mL/min (by C-G formula based on SCr of 0.53 mg/dL).   Medical History: Past Medical History:  Diagnosis Date   Anxiety    Candida esophagitis ([place]) 11/12/2014   CEREBROVASCULAR ACCIDENT, ACUTE 04/15/2010   CLOSTRIDIUM DIFFICILE COLITIS, HX OF 08/21/2007   CONGESTIVE HEART FAILURE 08/21/2007   Current use of long term anticoagulation    Dr. Andree Elk, [facility]   CVA 04/17/2010   Depression    Dr. Andree Elk, [place], TYPE II 08/21/2007   DVT, HX OF 08/21/2007   GERD 08/21/2007   GOUT 08/21/2007   History of stroke with  residual effects    HYPERLIPIDEMIA 08/21/2007   HYPERTENSION 08/21/2007   Dr. Andree Elk, [place], HX OF 08/22/2007   s/p renal transplant-Dr. Bonney Leitz   LUPUS 08/21/2007   OSTEOPOROSIS 08/21/2007   [facility]umatol at [facility]   Pulmonary embolism ([place]) 07/16/2010   Renal failure    RENAL INSUFFICIENCY 08/21/2007   Right sided weakness    Steroid-induced hyperglycemia 11/09/2014   Tachycardia    THYROID NODULE, LEFT 04/10/2009    Medications: Pt on warfarin chronically PTA -Home dose: 6.5 mg PO daily (last dose 8/21 PTA) per nursing home MAR -INR on admission: 2.3 (therapeutic)  Assessment: Pt is a 3 yoF chronically on warfarin for a history of PE, DVT, and CVA. Pharmacy consulted to dose/monitor while inpatient.   Today, 01/29/21 INR = 3 is therapeutic on upper end of therapeutic range CBC (8/24): Hgb slightly low, decreased. Possibly hemodilutional, no bleeding reported. Plt WNL Diet: Advanced to full liquid  DDI: No significant DDI with current active medications. Of note, pt received 2 doses of metronidazole on 8/23 which has significant DDI with warfarin which can result in increased INR  Goal of Therapy:  INR 2-3   Plan:  Warfarin 3 mg PO once  today. Reduced dose since INR on upper end of range and on full liquid diet INR daily, CBC with AM labs tomorrow Monitor for signs of bleeding  If patient were to discharge today, would recommend resuming home warfarin dose tomorrow with INR check within 48 hours of hospital discharge.  Lenis Noon, PharmD 01/29/21 7:14 AM

## 2021-01-30 DIAGNOSIS — R109 Unspecified abdominal pain: Secondary | ICD-10-CM | POA: Diagnosis not present

## 2021-01-30 DIAGNOSIS — K529 Noninfective gastroenteritis and colitis, unspecified: Secondary | ICD-10-CM | POA: Diagnosis not present

## 2021-01-30 DIAGNOSIS — I5032 Chronic diastolic (congestive) heart failure: Secondary | ICD-10-CM | POA: Diagnosis not present

## 2021-01-30 DIAGNOSIS — A0472 Enterocolitis due to Clostridium difficile, not specified as recurrent: Secondary | ICD-10-CM | POA: Diagnosis not present

## 2021-01-30 LAB — BASIC METABOLIC PANEL
Anion gap: 5 (ref 5–15)
BUN: <5 mg/dL — ABNORMAL LOW (ref 6–20)
CO2: 22 mmol/L (ref 22–32)
Calcium: 8.1 mg/dL — ABNORMAL LOW (ref 8.9–10.3)
Chloride: 112 mmol/L — ABNORMAL HIGH (ref 98–111)
Creatinine, Ser: 0.4 mg/dL — ABNORMAL LOW (ref 0.44–1.00)
GFR, Estimated: >60 mL/min (ref >60)
Glucose, Bld: 79 mg/dL (ref 70–99)
Potassium: 3.3 mmol/L — ABNORMAL LOW (ref 3.5–5.1)
Sodium: 139 mmol/L (ref 135–145)

## 2021-01-30 LAB — PROTIME-INR
INR: 3 — ABNORMAL HIGH (ref 0.8–1.2)
Prothrombin Time: 30.9 seconds — ABNORMAL HIGH (ref 11.4–15.2)

## 2021-01-30 LAB — PROCALCITONIN: Procalcitonin: <0.1 ng/mL

## 2021-01-30 LAB — GLUCOSE, CAPILLARY
Glucose-Capillary: 101 mg/dL — ABNORMAL HIGH (ref 70–99)
Glucose-Capillary: 111 mg/dL — ABNORMAL HIGH (ref 70–99)
Glucose-Capillary: 107 mg/dL — ABNORMAL HIGH (ref 70–99)
Glucose-Capillary: 131 mg/dL — ABNORMAL HIGH (ref 70–99)

## 2021-01-30 LAB — CBC
HCT: 36.7 % (ref 36.0–46.0)
Hemoglobin: 11.1 g/dL — ABNORMAL LOW (ref 12.0–15.0)
MCH: 26.9 pg (ref 26.0–34.0)
MCHC: 30.2 g/dL (ref 30.0–36.0)
MCV: 89.1 fL (ref 80.0–100.0)
Platelets: 238 10*3/uL (ref 150–400)
RBC: 4.12 MIL/uL (ref 3.87–5.11)
RDW: 15.2 % (ref 11.5–15.5)
WBC: 8.6 10*3/uL (ref 4.0–10.5)
nRBC: 0 % (ref 0.0–0.2)

## 2021-01-30 LAB — TROPONIN I (HIGH SENSITIVITY): Troponin I (High Sensitivity): 14 ng/L (ref <18)

## 2021-01-30 LAB — MAGNESIUM: Magnesium: 1.1 mg/dL — ABNORMAL LOW (ref 1.7–2.4)

## 2021-01-30 MED ORDER — KCL IN DEXTROSE-NACL 20-5-0.45 MEQ/L-%-% IV SOLN
INTRAVENOUS | Status: DC
  Filled 2021-01-30 (×2): qty 1000

## 2021-01-30 MED ORDER — DILTIAZEM HCL 25 MG/5ML IV SOLN
10.0000 mg | Freq: Once | INTRAVENOUS | Status: AC
  Administered 2021-01-30: 10 mg via INTRAVENOUS
  Filled 2021-01-30: qty 5

## 2021-01-30 MED ORDER — ONDANSETRON HCL 4 MG/2ML IJ SOLN
4.0000 mg | Freq: Four times a day (QID) | INTRAMUSCULAR | Status: DC | PRN
  Administered 2021-01-30 (×2): 4 mg via INTRAVENOUS
  Filled 2021-01-30 (×2): qty 2

## 2021-01-30 MED ORDER — METOCLOPRAMIDE HCL 5 MG/ML IJ SOLN
10.0000 mg | Freq: Four times a day (QID) | INTRAMUSCULAR | Status: DC
  Administered 2021-01-30 – 2021-02-07 (×32): 10 mg via INTRAVENOUS
  Filled 2021-01-30 (×32): qty 2

## 2021-01-30 MED ORDER — MAGNESIUM SULFATE 50 % IJ SOLN
4.0000 g | Freq: Once | INTRAVENOUS | Status: AC
  Administered 2021-01-30: 4 g via INTRAVENOUS
  Filled 2021-01-30: qty 8

## 2021-01-30 MED ORDER — FIDAXOMICIN 200 MG PO TABS
200.0000 mg | ORAL_TABLET | Freq: Two times a day (BID) | ORAL | Status: DC
  Administered 2021-01-30 – 2021-02-04 (×10): 200 mg via ORAL
  Filled 2021-01-30 (×12): qty 1

## 2021-01-30 MED ORDER — SODIUM CHLORIDE 0.9 % IV SOLN
25.0000 mg | Freq: Four times a day (QID) | INTRAVENOUS | Status: DC | PRN
  Administered 2021-02-03 – 2021-02-05 (×3): 25 mg via INTRAVENOUS
  Filled 2021-01-30 (×2): qty 1
  Filled 2021-01-30 (×2): qty 25
  Filled 2021-01-30: qty 1

## 2021-01-30 MED ORDER — WARFARIN SODIUM 3 MG PO TABS
3.0000 mg | ORAL_TABLET | Freq: Once | ORAL | Status: DC
  Filled 2021-01-30: qty 1

## 2021-01-30 MED ORDER — HYDROCORTISONE NA SUCCINATE PF 100 MG IJ SOLR
40.0000 mg | Freq: Two times a day (BID) | INTRAMUSCULAR | Status: DC
  Administered 2021-01-30 – 2021-02-01 (×4): 40 mg via INTRAVENOUS
  Filled 2021-01-30 (×4): qty 2

## 2021-01-30 MED ORDER — VANCOMYCIN HCL 500 MG IV SOLR
500.0000 mg | Freq: Four times a day (QID) | Status: DC
  Administered 2021-01-30 – 2021-02-02 (×9): 500 mg via RECTAL
  Filled 2021-01-30 (×13): qty 10

## 2021-01-30 MED ORDER — LEVOTHYROXINE SODIUM 100 MCG/5ML IV SOLN
75.0000 ug | Freq: Every day | INTRAVENOUS | Status: DC
  Administered 2021-01-31 – 2021-02-03 (×4): 75 ug via INTRAVENOUS
  Filled 2021-01-30 (×4): qty 5

## 2021-01-30 MED ORDER — FENTANYL CITRATE PF 50 MCG/ML IJ SOSY
25.0000 ug | PREFILLED_SYRINGE | INTRAMUSCULAR | Status: DC | PRN
  Administered 2021-01-30 – 2021-01-31 (×10): 50 ug via INTRAVENOUS
  Administered 2021-01-31: 25 ug via INTRAVENOUS
  Administered 2021-01-31 – 2021-02-06 (×31): 50 ug via INTRAVENOUS
  Filled 2021-01-30 (×45): qty 1

## 2021-01-30 MED ORDER — FAMOTIDINE IN NACL 20-0.9 MG/50ML-% IV SOLN
20.0000 mg | Freq: Two times a day (BID) | INTRAVENOUS | Status: DC
  Administered 2021-01-30 – 2021-02-07 (×16): 20 mg via INTRAVENOUS
  Filled 2021-01-30 (×16): qty 50

## 2021-01-30 NOTE — Consult Note (Signed)
[facility] UNASSIGNED CONSULTATION  Reason for Consult: Nausea, vomiting, C. Diff colitis Referring Physician: [facility]  Connie Ruiz HPI: This is a 60 year old female with a PMH of chronic diarrhea previously treated with cholestyramine by GI in [place] (04/2020), s/p renal transplant in 2005 for ESRD, GERD, hyperlipidemia, DM, HTN, gout, hypothyroidism, CVA, and DVT admitted from SNF for complaints of diarrhea.  Her symptoms started one week ago and she averaged 7 nonbloody watery bowel movements per day.  Stool studies collected in the ER initially showed C. Diff antigen positivity, but not toxin.  The PCR was then positive for the toxigenic strain of C. Diff and she was clinically treated for C. Diff with vancomycin 125 mg QID.  Unfortunately the patient does not report any improvement with starting vancomycin 3 days ago.  Diffuse abdominal pain is still present and now she complains of nausea/vomiting.  Her nausea is not necessarily new, but it is more intense.  The vomiting is a new problem for her.  She and her family report that she continued to have problems with diarrhea at [facility] and it was not responsive to cholestyramine.  Past Medical History:  Diagnosis Date   Anxiety    Candida esophagitis ([place]) 11/12/2014   CEREBROVASCULAR ACCIDENT, ACUTE 04/15/2010   CLOSTRIDIUM DIFFICILE COLITIS, HX OF 08/21/2007   CONGESTIVE HEART FAILURE 08/21/2007   Current use of long term anticoagulation    Dr. Andree Elk, [facility]   CVA 04/17/2010   Depression    Dr. Andree Elk, [place], TYPE II 08/21/2007   DVT, HX OF 08/21/2007   GERD 08/21/2007   GOUT 08/21/2007   History of stroke with residual effects    HYPERLIPIDEMIA 08/21/2007   HYPERTENSION 08/21/2007   Dr. Andree Elk, [place], HX OF 08/22/2007   s/p renal transplant-Dr. Adams, [place] 08/21/2007   OSTEOPOROSIS 08/21/2007   [facility] at [facility]   Pulmonary embolism ([place]) 07/16/2010   Renal failure     RENAL INSUFFICIENCY 08/21/2007   Right sided weakness    Steroid-induced hyperglycemia 11/09/2014   Tachycardia    THYROID NODULE, LEFT 04/10/2009    Past Surgical History:  Procedure Laterality Date   CESAREAN SECTION     CHOLECYSTECTOMY     COLONOSCOPY  06/2000   [facility]    ENTEROSCOPY N/A 11/11/2014   Procedure: ENTEROSCOPY;  Surgeon: Ladene Artist, MD;  Location: [facility] ENDOSCOPY;  Service: Endoscopy;  Laterality: N/A;   KIDNEY TRANSPLANT Right 2009   RENAL BIOPSY, OPEN  1981   TUBAL LIGATION      Family History  Problem Relation Age of Onset   Heart attack Mother    Heart disease Father    Asthma Sister    Asthma Daughter    Cancer Maternal Grandfather        prostate   Cancer Paternal Grandfather        colon    Social History:  reports that she has never smoked. She has never used smokeless tobacco. She reports that she does not drink alcohol and does not use drugs.  Allergies:  Allergies  Allergen Reactions   Oxycodone-Acetaminophen Shortness Of Breath and Nausea Only   Propoxyphene Nausea Only and Shortness Of Breath    States takes tylenol at home   Propoxyphene N-Acetaminophen Shortness Of Breath and Nausea Only   Sulfonamide Derivatives Shortness Of Breath and Nausea Only   Codeine Nausea Only   Hydrocodone-Acetaminophen    Hydromorphone  Other Other (See Comments)    No blood, Jehovaeh Witness    Oxycodone    Sulfamethoxazole    Tape     Redness**PAPER TAPE OK**   Gabapentin Anxiety    twitching   Latex Rash   Metoprolol Rash   Morphine And Related Rash    IV site on arm is red, patient reports this is improving.  NO shortness of breath reported.   Rosiglitazone Rash    Medications: Scheduled:  atorvastatin  40 mg Oral q1800   diltiazem  360 mg Oral Daily   donepezil  10 mg Oral QHS   famotidine  20 mg Oral BID   gabapentin  300 mg Oral BID   insulin aspart  0-5 Units Subcutaneous QHS   insulin aspart  0-9 Units Subcutaneous TID WC    levothyroxine  150 mcg Oral QAC breakfast   losartan  100 mg Oral Daily   magnesium oxide  800 mg Oral Daily   mirtazapine  30 mg Oral QHS   mycophenolate  250 mg Oral BID   predniSONE  5 mg Oral Q breakfast   tacrolimus  2.5 mg Oral Q12H   vancomycin  125 mg Oral QID   Continuous:  sodium chloride 75 mL/hr at 01/30/21 0500    Results for orders placed or performed during the hospital encounter of 01/26/21 (from the past 24 hour(s))  Glucose, capillary     Status: Abnormal   Collection Time: 01/29/21  4:43 PM  Result Value Ref Range   Glucose-Capillary 155 (H) 70 - 99 mg/dL  Glucose, capillary     Status: Abnormal   Collection Time: 01/29/21  8:07 PM  Result Value Ref Range   Glucose-Capillary 114 (H) 70 - 99 mg/dL  Protime-INR     Status: Abnormal   Collection Time: 01/30/21  5:26 AM  Result Value Ref Range   Prothrombin Time 30.9 (H) 11.4 - 15.2 seconds   INR 3.0 (H) 0.8 - 1.2  Basic metabolic panel     Status: Abnormal   Collection Time: 01/30/21  5:26 AM  Result Value Ref Range   Sodium 139 135 - 145 mmol/L   Potassium 3.3 (L) 3.5 - 5.1 mmol/L   Chloride 112 (H) 98 - 111 mmol/L   CO2 22 22 - 32 mmol/L   Glucose, Bld 79 70 - 99 mg/dL   BUN <5 (L) 6 - 20 mg/dL   Creatinine, Ser 0.40 (L) 0.44 - 1.00 mg/dL   Calcium 8.1 (L) 8.9 - 10.3 mg/dL   GFR, Estimated >60 >60 mL/min   Anion gap 5 5 - 15  CBC     Status: Abnormal   Collection Time: 01/30/21  5:26 AM  Result Value Ref Range   WBC 8.6 4.0 - 10.5 K/uL   RBC 4.12 3.87 - 5.11 MIL/uL   Hemoglobin 11.1 (L) 12.0 - 15.0 g/dL   HCT 36.7 36.0 - 46.0 %   MCV 89.1 80.0 - 100.0 fL   MCH 26.9 26.0 - 34.0 pg   MCHC 30.2 30.0 - 36.0 g/dL   RDW 15.2 11.5 - 15.5 %   Platelets 238 150 - 400 K/uL   nRBC 0.0 0.0 - 0.2 %  Magnesium     Status: Abnormal   Collection Time: 01/30/21  5:26 AM  Result Value Ref Range   Magnesium 1.1 (L) 1.7 - 2.4 mg/dL  Glucose, capillary     Status: Abnormal   Collection Time: 01/30/21  7:47 AM   Result  Value Ref Range   Glucose-Capillary 101 (H) 70 - 99 mg/dL  Glucose, capillary     Status: Abnormal   Collection Time: 01/30/21 12:17 PM  Result Value Ref Range   Glucose-Capillary 111 (H) 70 - 99 mg/dL     CT ABDOMEN PELVIS WO CONTRAST  Result Date: 01/29/2021 CLINICAL DATA:  Bowel obstruction suspected. Abdominal pain, acute, nonlocalized. End-stage renal disease with previous renal transplant. EXAM: CT ABDOMEN AND PELVIS WITHOUT CONTRAST TECHNIQUE: Multidetector CT imaging of the abdomen and pelvis was performed following the standard protocol without IV contrast. COMPARISON:  01/26/2021 FINDINGS: Lower chest: Elevation of the right hemidiaphragm with mild atelectasis at the right lung base, similar to the previous study. Extensive coronary artery calcification. Hepatobiliary: Previous cholecystectomy.  No focal liver lesion. Pancreas: Normal Spleen: Normal Adrenals/Urinary Tract: Adrenal glands are normal. Markedly atrophic native kidneys without significant finding. Small cysts as seen previously. Transplant kidney in the right iliac fossa without abnormal finding by CT. Bladder appears normal. Stomach/Bowel: Stomach and small intestine are normal. Diverticulosis of the colon but no evidence of diverticulitis. No bowel obstruction or focal bowel lesion. Vascular/Lymphatic: Aortic atherosclerosis. No aneurysm. IVC is normal. No retroperitoneal adenopathy. Reproductive: No pelvic mass. Other: No free fluid or air. Musculoskeletal: No significant skeletal finding. IMPRESSION: No bowel obstruction or acute bowel pathology. Diverticulosis without evidence of diverticulitis. Elevation of the right hemidiaphragm with atelectasis at the right lung base. Atrophic native kidneys. Grossly normal appearing transplant kidney in the right iliac fossa. Aortic atherosclerosis. Widespread vascular calcification consistent with chronic renal failure. Electronically Signed   By: Nelson Chimes M.D.   On:  01/29/2021 16:10   DG Abd 2 Views  Result Date: 01/29/2021 CLINICAL DATA:  Abdominal pain EXAM: ABDOMEN - 2 VIEW COMPARISON:  None. FINDINGS: There is mildly dilated of the cecum. Nondistended small bowel. No acute osseous abnormality. There are soft tissue calcifications consistent with subcutaneous calcification seen on prior CT. IMPRESSION: Mild dilation of the cecum. If there is concern for obstruction recommend CT. Electronically Signed   By: Maurine Simmering M.D.   On: 01/29/2021 09:25    ROS:  As stated above in the HPI otherwise negative.  Blood pressure (!) 153/85, pulse (!) 124, temperature 98.6 F (37 C), temperature source Oral, resp. rate 14, height 5\' 4"  (1.626 m), weight 98 kg, SpO2 93 %.    PE: Gen: Alert and Oriented, ill and uncomfortable appearing HEENT:  [place]/AT, EOMI Neck: Supple, no LAD Lungs: CTA Bilaterally CV: RRR without M/G/R ABD: Soft, diffusely tender, but no acute abdomen, +BS Ext: No C/C/E  Assessment/Plan: 1) C. Diff diarrhea. 2) Nausea and vomiting. 3) Multiple medical problems.   The patient continues to have diarrhea and she is now suffering with nausea and vomiting.  An attempt by her GI to treat her diarrhea failed with cholestyramine and she does not appear to be responding to vancomycin.  With her symptoms, further endoscopic evaluation is warranted.  Plan: 1) EGD and unprepped FFS with biopsies tomorrow. 2) Continue with vancomycin for now.  ? Need for Dificid in the near future.  Connie Ruiz D 01/30/2021, 12:54 PM

## 2021-01-30 NOTE — Plan of Care (Signed)
Pt is injury free, afebrile, alert, and oriented. VS within baseline. Pt remains to complain of abdominal pain and she is getting pain medication as scheduled. She denies chest pain, SOB, or acute changes during this shift. Will continue to monitor  Problem: Education: Goal: Knowledge of General Education information will improve Description: Including pain rating scale, medication(s)/side effects and non-pharmacologic comfort measures Outcome: Progressing   Problem: Health Behavior/Discharge Planning: Goal: Ability to manage health-related needs will improve Outcome: Progressing   Problem: Clinical Measurements: Goal: Ability to maintain clinical measurements within normal limits will improve Outcome: Progressing Goal: Will remain free from infection Outcome: Progressing Goal: Diagnostic test results will improve Outcome: Progressing Goal: Respiratory complications will improve Outcome: Progressing Goal: Cardiovascular complication will be avoided Outcome: Progressing   Problem: Nutrition: Goal: Adequate nutrition will be maintained Outcome: Progressing   Problem: Coping: Goal: Level of anxiety will decrease Outcome: Progressing   Problem: Elimination: Goal: Will not experience complications related to bowel motility Outcome: Progressing Goal: Will not experience complications related to urinary retention Outcome: Progressing   Problem: Safety: Goal: Ability to remain free from injury will improve Outcome: Progressing   Problem: Skin Integrity: Goal: Risk for impaired skin integrity will decrease Outcome: Progressing   Problem: Activity: Goal: Risk for activity intolerance will decrease Outcome: Not Progressing   Problem: Pain Managment: Goal: General experience of comfort will improve Outcome: Not Progressing

## 2021-01-30 NOTE — Care Management Important Message (Signed)
Medicare IM printed for [facility] to give to the patient. 

## 2021-01-30 NOTE — Significant Event (Signed)
Rapid Response Event Note   Reason for Call : Yellow MEWs, Tachycardia Notified by bedside RN that patient was having tachycardia for a good portion of the shift. Patient does have an C-diff infection. Upon arrival, bedside RN was giving cardizem IV for tachycardia. Patient was warm to the touch, an appeared to have a fever. Rectal temperature was a 100.7 F.    Initial Focused Assessment:  Neuro: Patient fatigued, but able to follow commands, alert and oriented x 4. Patient having abdominal cramping/pain due to c-diff infection.  Cardiac: ST, HR 120s, BP HTN see vitals Pulmonary: RR 18-20, O2 99% RA, no respiratory distress, breath sounds clear upon auscultation in all lung fields.     Interventions:  Obtained Rectal Temperature Removed k-pad to minimize heat due to fever Rectal Tylenol given  Flexiseal placed per order    Plan of Care:  Patient can remain on [place] with telemetry. Continue to monitor patient's tachycardia. If tachycardia worsens please call rapid response. If patient has a change in mentation or any hemodynamic deterioration please call Rapid Response at [phone].    Event Summary:   MD Notified: MD Rai at [facility] Call Time: [id number] Arrival Time: 1800 End Time: [place], RN

## 2021-01-30 NOTE — Progress Notes (Signed)
   01/30/21 8032  Assess: MEWS Score  Temp 98.6 F (37 C)  BP (!) 153/85  Pulse Rate (!) 124 (Chance Munter in room when vitals were taken, RN notified)  Resp 14  SpO2 93 %  Assess: MEWS Score  MEWS Temp 0  MEWS Systolic 0  MEWS Pulse 2  MEWS RR 0  MEWS LOC 0  MEWS Score 2  MEWS Score Color Yellow  Assess: if the MEWS score is Yellow or Red  Were vital signs taken at a resting state? Yes  Focused Assessment No change from prior assessment  Does the patient meet 2 or more of the SIRS criteria? No  MEWS guidelines implemented *See Row Information* Yes  Treat  MEWS Interventions Other (Comment)  Take Vital Signs  Increase Vital Sign Frequency  Yellow: Q 2hr X 2 then Q 4hr X 2, if remains yellow, continue Q 4hrs  Escalate  MEWS: Escalate Yellow: discuss with charge nurse/RN and consider discussing with provider and RRT  Notify: Charge Nurse/RN  Name of Charge Nurse/RN Notified Jovon Streetman, RN  Date Charge Nurse/RN Notified 01/30/21  Time Charge Nurse/RN Notified 0930  Document  Patient Outcome Other (Comment)  Progress note created (see row info) Yes  Assess: SIRS CRITERIA  SIRS Temperature  0  SIRS Pulse 1  SIRS Respirations  0  SIRS WBC 0  SIRS Score Sum  1   Pt in Yellow MEWS d/t increased HR of 124. MD aware, no new orders at this time. Will implement yellow MEWS guidelines.

## 2021-01-30 NOTE — Progress Notes (Signed)
   01/30/21 1632  Assess: MEWS Score  Temp 99.8 F (37.7 C)  BP (!) 152/91  Pulse Rate (!) 125  Resp (!) 24  SpO2 99 %  O2 Device Room Air  Assess: MEWS Score  MEWS Temp 0  MEWS Systolic 0  MEWS Pulse 2  MEWS RR 1  MEWS LOC 0  MEWS Score 3  MEWS Score Color Yellow  Assess: if the MEWS score is Yellow or Red  Were vital signs taken at a resting state? Yes  Focused Assessment Change from prior assessment (see assessment flowsheet)  Does the patient meet 2 or more of the SIRS criteria? Yes  Does the patient have a confirmed or suspected source of infection? No  MEWS guidelines implemented *See Row Information* Yes  Treat  MEWS Interventions Administered scheduled meds/treatments  Take Vital Signs  Increase Vital Sign Frequency  Yellow: Q 2hr X 2 then Q 4hr X 2, if remains yellow, continue Q 4hrs  Escalate  MEWS: Escalate Yellow: discuss with charge nurse/RN and consider discussing with provider and RRT  Notify: Charge Nurse/RN  Name of Charge Nurse/RN Notified Rosland Riding, RN  Date Charge Nurse/RN Notified 01/30/21  Time Charge Nurse/RN Notified 1635  Notify: Provider  Provider Name/Title Rai, MD  Date Provider Notified 01/30/21  Time Provider Notified 1635  Notification Type Page  Notification Reason Change in status  Provider response See new orders  Date of Provider Response 01/30/21  Time of Provider Response 1635  Notify: Rapid Response  Name of Rapid Response RN Notified Greggory Brandy, RN  Date Rapid Response Notified 01/30/21  Time Rapid Response Notified 1700  Document  Patient Outcome Other (Comment)  Progress note created (see row info) Yes  Assess: SIRS CRITERIA  SIRS Temperature  0  SIRS Pulse 1  SIRS Respirations  1  SIRS WBC 0  SIRS Score Sum  2   Pt continues to be in Yellow MEWS d/t increased HR of 125 and RR 24. Pt is diaphoretic and has 10/10 pain in abdomen. Pt also has complaints of new onset sharp chest pain. MD aware and placed new orders for Cardizem  10 mg IV push, EKG, and STAT troponin lab. Rapid response called to bedside to assess pt. Rectal temp taken of 100.7. Prn Tylenol given, Flexiseal placed, and prn Fentanyl administered to address abdominal pain.

## 2021-01-30 NOTE — Progress Notes (Signed)
   01/30/21 1709  PT Visit Information  Reason Eval/Treat Not Completed Medical issues which prohibited therapy (RN deferred, pt with constant bloody diarrhea and increased pain. PT will follow up as schedule allows and pt appropriate.)   Festus Barren., PT, DPT  Acute Rehabilitation Services  Office [phone]

## 2021-01-30 NOTE — Progress Notes (Signed)
ANTICOAGULATION CONSULT NOTE - Follow Up  Pharmacy Consult for Warfarin Indication: pulmonary embolus, DVT, and CVA  Allergies  Allergen Reactions   Oxycodone-Acetaminophen Shortness Of Breath and Nausea Only   Propoxyphene Nausea Only and Shortness Of Breath    States takes tylenol at home   Propoxyphene N-Acetaminophen Shortness Of Breath and Nausea Only   Sulfonamide Derivatives Shortness Of Breath and Nausea Only   Codeine Nausea Only   Hydrocodone-Acetaminophen    Hydromorphone    Other Other (See Comments)    No blood, Jehovaeh Witness    Oxycodone    Sulfamethoxazole    Tape     Redness**PAPER TAPE OK**   Gabapentin Anxiety    twitching   Latex Rash   Metoprolol Rash   Morphine And Related Rash    IV site on arm is red, patient reports this is improving.  NO shortness of breath reported.   Rosiglitazone Rash    Patient Measurements: Height: 5\' 4"  (162.6 cm) Weight: 98 kg (216 lb 0.8 oz) IBW/kg (Calculated) : 54.7  Vital Signs: Temp: 98.1 F (36.7 C) (08/26 0637) Temp Source: Oral (08/26 0637) BP: 176/108 (08/26 7902) Pulse Rate: 106 (08/26 0637)  Labs: Recent Labs    01/28/21 0519 01/29/21 0515 01/30/21 0526  HGB 11.3* 12.3 11.1*  HCT 38.0 45.2 36.7  PLT 254 262 238  LABPROT 32.2* 31.2* 30.9*  INR 3.1* 3.0* 3.0*  CREATININE 0.53 0.54 0.40*     Estimated Creatinine Clearance: 85 mL/min (A) (by C-G formula based on SCr of 0.4 mg/dL (L)).   Medical History: Past Medical History:  Diagnosis Date   Anxiety    Candida esophagitis ([place]) 11/12/2014   CEREBROVASCULAR ACCIDENT, ACUTE 04/15/2010   CLOSTRIDIUM DIFFICILE COLITIS, HX OF 08/21/2007   CONGESTIVE HEART FAILURE 08/21/2007   Current use of long term anticoagulation    Dr. Andree Elk, [facility]   CVA 04/17/2010   Depression    Dr. Andree Elk, [place], TYPE II 08/21/2007   DVT, HX OF 08/21/2007   GERD 08/21/2007   GOUT 08/21/2007   History of stroke with residual effects     HYPERLIPIDEMIA 08/21/2007   HYPERTENSION 08/21/2007   Dr. Andree Elk, [place], HX OF 08/22/2007   s/p renal transplant-Dr. Bonney Leitz   LUPUS 08/21/2007   OSTEOPOROSIS 08/21/2007   Rheumatol at [facility]   Pulmonary embolism ([place]) 07/16/2010   Renal failure    RENAL INSUFFICIENCY 08/21/2007   Right sided weakness    Steroid-induced hyperglycemia 11/09/2014   Tachycardia    THYROID NODULE, LEFT 04/10/2009    Medications: Pt on warfarin chronically PTA -Home dose: 6.5 mg PO daily (last dose 8/21 PTA) per nursing home MAR -INR on admission: 2.3 (therapeutic)  Assessment: Pt is a 53 yoF chronically on warfarin for a history of PE, DVT, and CVA. Pharmacy consulted to dose/monitor while inpatient.   Today, 01/30/21 INR = 3 remains therapeutic on upper end of therapeutic range CBC: Hgb slightly low. Plt WNL. Pt having melena this morning Diet: Clear liquid; 50% meal intake charted DDI: No significant DDI with current active medications.  Of note, pt received 2 doses of metronidazole on 8/23 which has significant DDI with warfarin which can result in increased INR  Goal of Therapy:  INR 2-3   Plan:  HOLD warfarin at this time per MD since pt having bloody stool  Pharmacy to sign off since anticoagulation being held. Please re-consult pharmacy when determined safe to resume anticoagulation.  Lenis Noon, PharmD 01/30/21 8:00 AM

## 2021-01-30 NOTE — Progress Notes (Addendum)
[facility]                                                                              Patient Demographics  Connie Ruiz, is a 60 y.o. female, DOB - 11-14-1960, JEH:[id number]  Admit date - 01/26/2021   Admitting Physician Donnamarie Poag [place] DO  Outpatient Primary MD for the patient is [name], [name], MD  Outpatient specialists:   LOS - 3  days   Medical records reviewed and are as summarized below:    Chief Complaint  Patient presents with   Abdominal Pain       Brief summary   Patient is 60 year old female, [name], ESRD, now status post renal transplant in 2005, hypothyroidism, gout, GERD, hyperlipidemia, diabetes mellitus type 2, SLE, DVT on warfarin presented from [facility] with abdominal pain and diarrhea for 5 days prior to admission.  Patient reported roughly 7 episodes in a day, watery, no hematochezia or melena.  Also reported generalized weakness, fatigue, no fevers or chills. C. difficile positive.  CT abdomen pelvis showed enteritis.  Assessment & Plan    Principal Problem: Acute enteritis likely due to C. difficile colitis, abdominal pain -Presented with profuse diarrhea, generalized weakness, abdominal pain - C. difficile PCR  Ag positive but toxin negative, high suspicion for C. difficile colitis, hence will treat -Continue to have nausea and vomiting, abdominal pain, worsening diarrhea -For now continue oral vancomycin will place rectal tube.  CT abdomen was repeated on 8/25, did not show any perforation, small bowel obstruction or toxic megacolon -GI consulted for further recommendations, plan for EGD and ?  Flex sig in a.m. pending results, may need to change to Dificid for 10 days -Placed on D5 half-normal saline Addendum: 6:30pm  Continues to have N/V/D, abd tendeness - will DC zofran and compazine, placed on scheduled reglan and PRN phenergan - DC oral vancomycin, change to Dificid  - flexiseal placed, will try vanc  enema - obtain Blood cultures, PCT - Hold prednisone, placed on stress dose steroids  - await EGD/ flex sig in am, if no significant improvement, will discuss with gen surgery   Active Problems: History of ESRD status post renal transplant, 2005 -creatinine stable, 0.5 -Continue mycophenolate, tacrolimus, pharmacy following  Essential hypertension, sinus tachycardia -Likely due to dehydration from fluid loss with diarrhea, C. difficile colitis -Continue Cardizem,  IV hydralazine as needed with parameters  GERD -Continue IV Pepcid  Hypothyroidism -Placed on Synthroid 75 MCG IV daily -TSH 0.5   Diabetes mellitus, type II, IDDM -Hemoglobin A1c 8.2 -CBGs controlled  Hyperlipidemia -Continue atorvastatin  Depression/anxiety -Continue sertraline  History of DVT/PE/CVA -On Coumadin, will hold, INR 3.0, planning endoscopy  Hypokalemia, hypomagnesemia -Replaced IV   History of [name] -Declines any blood products if needed  Obesity Estimated body mass index is 37.09 kg/m as calculated from the following:   Height as of this encounter: 5\' 4"  (1.626 m).   Weight as of this encounter: 98 kg.  Code Status: Full CODE STATUS DVT Prophylaxis:    Warfarin   Level of Care: Level of care: Telemetry Family Communication: Discussed all imaging results, lab  results, explained to the patient   Disposition Plan:     Status is: Inpatient  Remains inpatient appropriate because:Inpatient level of care appropriate due to severity of illness  Dispo: The patient is from: SNF              Anticipated d/c is to: SNF              Patient currently is not medically stable to d/c.  N.p.o. secondary to ileus versus SBO, CT pending   Difficult to place patient No      Time Spent in minutes   35 minutes  Procedures:  None  Consultants:   Gastroenterology  Antimicrobials:   Anti-infectives (From admission, onward)    Start     Dose/Rate Route Frequency Ordered Stop    01/27/21 2200  cefTRIAXone (ROCEPHIN) 2 g in sodium chloride 0.9 % 100 mL IVPB  Status:  Discontinued        2 g 200 mL/hr over 30 Minutes Intravenous Every 24 hours 01/27/21 0956 01/27/21 1136   01/27/21 1200  vancomycin (VANCOCIN) capsule 125 mg        125 mg Oral 4 times daily 01/27/21 1135 02/06/21 1359   01/27/21 1000  metroNIDAZOLE (FLAGYL) IVPB 500 mg  Status:  Discontinued        500 mg 100 mL/hr over 60 Minutes Intravenous Every 12 hours 01/27/21 0901 01/27/21 1135   01/27/21 0115  cefTRIAXone (ROCEPHIN) 2 g in sodium chloride 0.9 % 100 mL IVPB        2 g 200 mL/hr over 30 Minutes Intravenous  Once 01/27/21 0103 01/27/21 0411   01/27/21 0115  metroNIDAZOLE (FLAGYL) IVPB 500 mg  Status:  Discontinued        500 mg 100 mL/hr over 60 Minutes Intravenous Every 8 hours 01/27/21 0103 01/27/21 0901          Medications  Scheduled Meds:  atorvastatin  40 mg Oral q1800   diltiazem  360 mg Oral Daily   donepezil  10 mg Oral QHS   famotidine  20 mg Oral BID   gabapentin  300 mg Oral BID   insulin aspart  0-5 Units Subcutaneous QHS   insulin aspart  0-9 Units Subcutaneous TID WC   levothyroxine  150 mcg Oral QAC breakfast   losartan  100 mg Oral Daily   magnesium oxide  800 mg Oral Daily   mirtazapine  30 mg Oral QHS   mycophenolate  250 mg Oral BID   predniSONE  5 mg Oral Q breakfast   tacrolimus  2.5 mg Oral Q12H   vancomycin  125 mg Oral QID   Continuous Infusions:  sodium chloride 75 mL/hr at 01/30/21 0500   PRN Meds:.acetaminophen **OR** acetaminophen, fentaNYL (SUBLIMAZE) injection, hydrALAZINE, ondansetron (ZOFRAN) IV, polyethylene glycol, prochlorperazine, traMADol      Subjective:   Connie Ruiz was seen and examined today.  Abdominal pain, worse today, nausea vomiting and diarrhea.  No fevers.  Feeling miserable.    Objective:   Vitals:   01/30/21 0637 01/30/21 0922 01/30/21 1137 01/30/21 1306  BP: (!) 176/108 (!) 153/85 (!) 151/82 (!) 155/95  Pulse:  (!) 106 (!) 124 (!) 123 (!) 119  Resp: 16 14  (!) 22  Temp: 98.1 F (36.7 C) 98.6 F (37 C) 98.8 F (37.1 C) 99.3 F (37.4 C)  TempSrc: Oral Oral Oral Oral  SpO2: 95% 93% 97% 100%  Weight:      Height:  Intake/Output Summary (Last 24 hours) at 01/30/2021 1537 Last data filed at 01/30/2021 1311 Gross per 24 hour  Intake 236 ml  Output --  Net 236 ml     Wt Readings from Last 3 Encounters:  01/29/21 98 kg  08/21/20 88.9 kg  09/11/19 78.9 kg    Physical Exam General: Alert and oriented x 3, NAD Cardiovascular: Tachycardia Respiratory: Diminished breath sound at the bases Gastrointestinal: Soft, diffuse TTP  nondistended, NBS Ext: no pedal edema bilaterally Neuro: no new deficits Skin: No rashes Psych: anxious, uncomfortable    Data Reviewed:  I have personally reviewed following labs and imaging studies  Micro Results Recent Results (from the past 240 hour(s))  Resp Panel by RT-PCR (Flu A&B, Covid) Nasopharyngeal Swab     Status: None   Collection Time: 01/26/21  9:56 PM   Specimen: Nasopharyngeal Swab; Nasopharyngeal(NP) swabs in vial transport medium  Result Value Ref Range Status   SARS Coronavirus 2 by RT PCR NEGATIVE NEGATIVE Final    Comment: (NOTE) SARS-CoV-2 target nucleic acids are NOT DETECTED.  The SARS-CoV-2 RNA is generally detectable in upper respiratory specimens during the acute phase of infection. The lowest concentration of SARS-CoV-2 viral copies this assay can detect is 138 copies/mL. A negative result does not preclude SARS-Cov-2 infection and should not be used as the sole basis for treatment or other patient management decisions. A negative result may occur with  improper specimen collection/handling, submission of specimen other than nasopharyngeal swab, presence of viral mutation(s) within the areas targeted by this assay, and inadequate number of viral copies(<138 copies/mL). A negative result must be combined with clinical  observations, patient history, and epidemiological information. The expected result is Negative.  Fact Sheet for Patients:  EntrepreneurPulse.com.au  Fact Sheet for Healthcare Providers:  IncredibleEmployment.be  This test is no t yet approved or cleared by the [place] FDA and  has been authorized for detection and/or diagnosis of SARS-CoV-2 by FDA under an Emergency Use Authorization (EUA). This EUA will remain  in effect (meaning this test can be used) for the duration of the COVID-19 declaration under Section 564(b)(1) of the Act, 21 U.S.C.section 360bbb-3(b)(1), unless the authorization is terminated  or revoked sooner.       Influenza A by PCR NEGATIVE NEGATIVE Final   Influenza B by PCR NEGATIVE NEGATIVE Final    Comment: (NOTE) The Xpert Xpress SARS-CoV-2/FLU/RSV plus assay is intended as an aid in the diagnosis of influenza from Nasopharyngeal swab specimens and should not be used as a sole basis for treatment. Nasal washings and aspirates are unacceptable for Xpert Xpress SARS-CoV-2/FLU/RSV testing.  Fact Sheet for Patients: EntrepreneurPulse.com.au  Fact Sheet for Healthcare Providers: IncredibleEmployment.be  This test is not yet approved or cleared by the [place] FDA and has been authorized for detection and/or diagnosis of SARS-CoV-2 by FDA under an Emergency Use Authorization (EUA). This EUA will remain in effect (meaning this test can be used) for the duration of the COVID-19 declaration under Section 564(b)(1) of the Act, 21 U.S.C. section 360bbb-3(b)(1), unless the authorization is terminated or revoked.  Performed at [facility], [place] [address] [place], [place] [address]   Blood culture (routine x 2)     Status: None (Preliminary result)   Collection Time: 01/27/21 12:32 AM   Specimen: Left Antecubital; Blood  Result Value Ref Range Status    Specimen Description   Final    LEFT ANTECUBITAL Performed at [facility]  [address] [place], [place] [address]    Special Requests   Final    BOTTLES DRAWN AEROBIC AND ANAEROBIC Blood Culture adequate volume Performed at [facility] [address], [place], [place] [address]    Culture   Final    NO GROWTH 3 DAYS Performed at [facility], [place] [address] [place], [place] [address]    Report Status PENDING  Incomplete  Blood culture (routine x 2)     Status: None (Preliminary result)   Collection Time: 01/27/21  3:21 AM   Specimen: Left Antecubital; Blood  Result Value Ref Range Status   Specimen Description   Final    LEFT ANTECUBITAL Performed at [facility] [place] [address], [place], [place] [address]    Special Requests   Final    BOTTLES DRAWN AEROBIC AND ANAEROBIC Blood Culture results may not be optimal due to an excessive volume of blood received in culture bottles Performed at [facility] [address], [place], [place] [address]    Culture   Final    NO GROWTH 3 DAYS Performed at [facility], [place] [address], [place], [place] [address]    Report Status PENDING  Incomplete  Gastrointestinal Panel by PCR , Stool     Status: None   Collection Time: 01/27/21  6:33 AM   Specimen: Stool  Result Value Ref Range Status   Campylobacter species NOT DETECTED NOT DETECTED Final   Plesimonas shigelloides NOT DETECTED NOT DETECTED Final   Salmonella species NOT DETECTED NOT DETECTED Final   Yersinia enterocolitica NOT DETECTED NOT DETECTED Final   Vibrio species NOT DETECTED NOT DETECTED Final   Vibrio cholerae NOT DETECTED NOT DETECTED Final   Enteroaggregative E coli (EAEC) NOT DETECTED NOT DETECTED Final   Enteropathogenic E coli (EPEC) NOT DETECTED NOT DETECTED Final   Enterotoxigenic E coli (ETEC) NOT DETECTED NOT DETECTED Final   Shiga like toxin producing E coli (STEC)  NOT DETECTED NOT DETECTED Final   Shigella/Enteroinvasive E coli (EIEC) NOT DETECTED NOT DETECTED Final   Cryptosporidium NOT DETECTED NOT DETECTED Final   Cyclospora cayetanensis NOT DETECTED NOT DETECTED Final   Entamoeba histolytica NOT DETECTED NOT DETECTED Final   Giardia lamblia NOT DETECTED NOT DETECTED Final   Adenovirus F40/41 NOT DETECTED NOT DETECTED Final   Astrovirus NOT DETECTED NOT DETECTED Final   Norovirus GI/GII NOT DETECTED NOT DETECTED Final   Rotavirus A NOT DETECTED NOT DETECTED Final   Sapovirus (I, II, IV, and V) NOT DETECTED NOT DETECTED Final    Comment: Performed at [facility], [place] [place], [place] [address]  C Difficile Quick Screen w PCR reflex     Status: Abnormal   Collection Time: 01/27/21  6:33 AM   Specimen: Stool  Result Value Ref Range Status   C Diff antigen POSITIVE (A) NEGATIVE Final   C Diff toxin NEGATIVE NEGATIVE Final   C Diff interpretation Results are indeterminate. See PCR results.  Final    Comment: Performed at [facility], [place] [address], [place],  [address]  C. Diff by PCR, Reflexed     Status: Abnormal   Collection Time: 01/27/21  6:33 AM  Result Value Ref Range Status   Toxigenic C. Difficile by PCR POSITIVE (A) NEGATIVE Final    Comment: Positive for toxigenic C. difficile with little to no toxin production. Only treat if clinical presentation suggests symptomatic illness. Performed at [facility], [address] [place], [place] [address]     Radiology Reports CT ABDOMEN PELVIS WO CONTRAST  Result Date: 01/29/2021 CLINICAL DATA:  Bowel obstruction suspected. Abdominal pain, acute, nonlocalized. End-stage renal disease with previous renal transplant. EXAM: CT ABDOMEN AND PELVIS WITHOUT CONTRAST TECHNIQUE: Multidetector CT imaging of the abdomen and pelvis was performed following the standard protocol without IV contrast. COMPARISON:  01/26/2021 FINDINGS: Lower chest:  Elevation of the right hemidiaphragm with mild atelectasis at the right lung base, similar to the previous study. Extensive coronary artery calcification. Hepatobiliary: Previous cholecystectomy.  No focal liver lesion. Pancreas: Normal Spleen: Normal Adrenals/Urinary Tract: Adrenal glands are normal. Markedly atrophic native kidneys without significant finding. Small cysts as seen previously. Transplant kidney in the right iliac fossa without abnormal finding by CT. Bladder appears normal. Stomach/Bowel: Stomach and small intestine are normal. Diverticulosis of the colon but no evidence of diverticulitis. No bowel obstruction or focal bowel lesion. Vascular/Lymphatic: Aortic atherosclerosis. No aneurysm. IVC is normal. No retroperitoneal adenopathy. Reproductive: No pelvic mass. Other: No free fluid or air. Musculoskeletal: No significant skeletal finding. IMPRESSION: No bowel obstruction or acute bowel pathology. Diverticulosis without evidence of diverticulitis. Elevation of the right hemidiaphragm with atelectasis at the right lung base. Atrophic native kidneys. Grossly normal appearing transplant kidney in the right iliac fossa. Aortic atherosclerosis. Widespread vascular calcification consistent with chronic renal failure. Electronically Signed   By: Nelson Chimes M.D.   On: 01/29/2021 16:10   CT ABDOMEN PELVIS WO CONTRAST  Result Date: 01/26/2021 CLINICAL DATA:  Concern for bowel obstruction. EXAM: CT ABDOMEN AND PELVIS WITHOUT CONTRAST TECHNIQUE: Multidetector CT imaging of the abdomen and pelvis was performed following the standard protocol without IV contrast. COMPARISON:  CT abdomen pelvis dated 08/21/2020. FINDINGS: Evaluation of this exam is limited in the absence of intravenous contrast. Lower chest: Minimal bibasilar atelectasis. The visualized lung bases are otherwise clear. There is advanced 3 vessel coronary vascular calcification. No intra-abdominal free air or free fluid. Hepatobiliary: The  liver is unremarkable. No intrahepatic biliary dilatation. Cholecystectomy. No retained calcified stone noted in the central CBD. Pancreas: Unremarkable. No pancreatic ductal dilatation or surrounding inflammatory changes. Spleen: Normal in size without focal abnormality. Adrenals/Urinary Tract: The adrenal glands are unremarkable. Atrophic native kidneys. Bilateral renal parenchyma nodularity similar to prior CT may represent residual renal parenchyma. A mass is less likely but not excluded. The visualized ureters appear unremarkable. The urinary bladder is collapsed. There is a right lower quadrant renal transplant. There is no hydronephrosis or nephrolithiasis of the transplant kidney. No peritransplant fluid collection. Stomach/Bowel: There is diffuse colonic diverticulosis without active inflammatory changes. There is mild thickened appearance of the jejunal folds with engorgement of the associated mesentery. Clinical correlation is recommended to evaluate for possibility of enteritis. There is no bowel obstruction. The appendix is normal. Vascular/Lymphatic: Moderate aortoiliac atherosclerotic disease. The IVC is unremarkable. No portal venous gas. There is no adenopathy. Reproductive: The uterus is grossly unremarkable. No adnexal masses. Other: Mild diffuse subcutaneous stranding.  No fluid collection. Musculoskeletal: Osteopenia with degenerative changes of the spine. Multiple old left rib fractures. No acute osseous pathology. IMPRESSION: 1. Mild thickened appearance of the jejunal folds with engorgement of the associated mesentery. Clinical correlation is recommended to evaluate for possibility of enteritis. No bowel obstruction. Normal appendix. 2. Colonic diverticulosis. 3. Atrophic native kidneys with a right lower quadrant renal transplant. No hydronephrosis or nephrolithiasis. 4. Aortic Atherosclerosis (ICD10-I70.0). Electronically Signed   By: Anner Crete M.D.   On:  01/26/2021 20:23   DG  Chest 2 View  Result Date: 01/26/2021 CLINICAL DATA:  Lower abdominal pain since Friday, tachycardia EXAM: CHEST - 2 VIEW COMPARISON:  08/28/2020 FINDINGS: Frontal and lateral views of the chest demonstrate an unremarkable cardiac silhouette. No acute airspace disease, effusion, or pneumothorax. No acute bony abnormalities. IMPRESSION: 1. No acute intrathoracic process. Electronically Signed   By: Randa Ngo M.D.   On: 01/26/2021 22:17   DG Abd 2 Views  Result Date: 01/29/2021 CLINICAL DATA:  Abdominal pain EXAM: ABDOMEN - 2 VIEW COMPARISON:  None. FINDINGS: There is mildly dilated of the cecum. Nondistended small bowel. No acute osseous abnormality. There are soft tissue calcifications consistent with subcutaneous calcification seen on prior CT. IMPRESSION: Mild dilation of the cecum. If there is concern for obstruction recommend CT. Electronically Signed   By: Maurine Simmering M.D.   On: 01/29/2021 09:25    Lab Data:  CBC: Recent Labs  Lab 01/26/21 1859 01/28/21 0519 01/29/21 0515 01/30/21 0526  WBC 12.4* 10.3 8.0 8.6  NEUTROABS 8.3*  --   --   --   HGB 14.4 11.3* 12.3 11.1*  HCT 46.6* 38.0 45.2 36.7  MCV 86.8 90.7 100.0 89.1  PLT 334 254 262 993   Basic Metabolic Panel: Recent Labs  Lab 01/26/21 1859 01/28/21 0519 01/29/21 0515 01/30/21 0526  NA 142 140 141 139  K 3.8 3.7 3.6 3.3*  CL 109 115* 113* 112*  CO2 23 21* 20* 22  GLUCOSE 152* 99 97 79  BUN 16 12 7  <5*  CREATININE 0.60 0.53 0.54 0.40*  CALCIUM 9.6 8.6* 8.3* 8.1*  MG  --   --  1.5* 1.1*   GFR: Estimated Creatinine Clearance: 85 mL/min (A) (by C-G formula based on SCr of 0.4 mg/dL (L)). Liver Function Tests: Recent Labs  Lab 01/26/21 1859 01/28/21 0519  AST 15 15  ALT 14 11  ALKPHOS 67 50  BILITOT 0.9 0.5  PROT 7.4 5.8*  ALBUMIN 3.6 2.9*   Recent Labs  Lab 01/26/21 1859 01/29/21 0515  LIPASE 27 26   No results for input(s): AMMONIA in the last 168 hours. Coagulation Profile: Recent Labs  Lab  01/26/21 2306 01/28/21 0519 01/29/21 0515 01/30/21 0526  INR 2.3* 3.1* 3.0* 3.0*   Cardiac Enzymes: No results for input(s): CKTOTAL, CKMB, CKMBINDEX, TROPONINI in the last 168 hours. BNP (last 3 results) No results for input(s): PROBNP in the last 8760 hours. HbA1C: No results for input(s): HGBA1C in the last 72 hours.  CBG: Recent Labs  Lab 01/29/21 1211 01/29/21 1643 01/29/21 2007 01/30/21 0747 01/30/21 1217  GLUCAP 113* 155* 114* 101* 111*   Lipid Profile: No results for input(s): CHOL, HDL, LDLCALC, TRIG, CHOLHDL, LDLDIRECT in the last 72 hours. Thyroid Function Tests: No results for input(s): TSH, T4TOTAL, FREET4, T3FREE, THYROIDAB in the last 72 hours.  Anemia Panel: No results for input(s): VITAMINB12, FOLATE, FERRITIN, TIBC, IRON, RETICCTPCT in the last 72 hours. Urine analysis:    Component Value Date/Time   COLORURINE YELLOW 01/26/2021 2156   APPEARANCEUR CLEAR 01/26/2021 2156   LABSPEC >1.030 (H) 01/26/2021 2156   PHURINE 6.0 01/26/2021 2156   GLUCOSEU NEGATIVE 01/26/2021 2156   GLUCOSEU NEGATIVE 10/22/2013 1621   HGBUR NEGATIVE 01/26/2021 2156   BILIRUBINUR MODERATE (A) 01/26/2021 2156   KETONESUR 40 (A) 01/26/2021 2156   PROTEINUR TRACE (A) 01/26/2021 2156   UROBILINOGEN 1.0 03/27/2015 1534   NITRITE NEGATIVE 01/26/2021 2156   LEUKOCYTESUR NEGATIVE 01/26/2021 2156  Estill Cotta M.D. [facility] 01/30/2021, 3:37 PM  Available via Epic secure chat 7am-7pm After 7 pm, please refer to night coverage provider listed on amion.

## 2021-01-31 ENCOUNTER — Encounter (HOSPITAL_COMMUNITY)
Admission: EM | Disposition: A | Discharge status: Skilled Nursing Facility | Payer: Self-pay | Source: Skilled Nursing Facility | Attending: Internal Medicine

## 2021-01-31 ENCOUNTER — Encounter (HOSPITAL_COMMUNITY): Payer: Self-pay | Admitting: Certified Registered Nurse Anesthetist

## 2021-01-31 DIAGNOSIS — R109 Unspecified abdominal pain: Secondary | ICD-10-CM | POA: Diagnosis not present

## 2021-01-31 DIAGNOSIS — I5032 Chronic diastolic (congestive) heart failure: Secondary | ICD-10-CM | POA: Diagnosis not present

## 2021-01-31 DIAGNOSIS — A0472 Enterocolitis due to Clostridium difficile, not specified as recurrent: Secondary | ICD-10-CM | POA: Diagnosis not present

## 2021-01-31 DIAGNOSIS — K529 Noninfective gastroenteritis and colitis, unspecified: Secondary | ICD-10-CM | POA: Diagnosis not present

## 2021-01-31 LAB — PROTIME-INR
INR: 3 — ABNORMAL HIGH (ref 0.8–1.2)
INR: 2.3 — ABNORMAL HIGH (ref 0.8–1.2)
INR: 1.8 — ABNORMAL HIGH (ref 0.8–1.2)
Prothrombin Time: 31.2 seconds — ABNORMAL HIGH (ref 11.4–15.2)
Prothrombin Time: 25.1 seconds — ABNORMAL HIGH (ref 11.4–15.2)
Prothrombin Time: 20.8 seconds — ABNORMAL HIGH (ref 11.4–15.2)

## 2021-01-31 LAB — CBC
HCT: 39.9 % (ref 36.0–46.0)
Hemoglobin: 12.3 g/dL (ref 12.0–15.0)
MCH: 26.9 pg (ref 26.0–34.0)
MCHC: 30.8 g/dL (ref 30.0–36.0)
MCV: 87.3 fL (ref 80.0–100.0)
Platelets: 283 10*3/uL (ref 150–400)
RBC: 4.57 MIL/uL (ref 3.87–5.11)
RDW: 15 % (ref 11.5–15.5)
WBC: 9.4 10*3/uL (ref 4.0–10.5)
nRBC: 0 % (ref 0.0–0.2)

## 2021-01-31 LAB — BASIC METABOLIC PANEL
Anion gap: 10 (ref 5–15)
BUN: 7 mg/dL (ref 6–20)
CO2: 19 mmol/L — ABNORMAL LOW (ref 22–32)
Calcium: 8.6 mg/dL — ABNORMAL LOW (ref 8.9–10.3)
Chloride: 111 mmol/L (ref 98–111)
Creatinine, Ser: 0.61 mg/dL (ref 0.44–1.00)
GFR, Estimated: >60 mL/min (ref >60)
Glucose, Bld: 205 mg/dL — ABNORMAL HIGH (ref 70–99)
Potassium: 3.6 mmol/L (ref 3.5–5.1)
Sodium: 140 mmol/L (ref 135–145)

## 2021-01-31 LAB — MAGNESIUM: Magnesium: 2 mg/dL (ref 1.7–2.4)

## 2021-01-31 LAB — GLUCOSE, CAPILLARY
Glucose-Capillary: 158 mg/dL — ABNORMAL HIGH (ref 70–99)
Glucose-Capillary: 142 mg/dL — ABNORMAL HIGH (ref 70–99)
Glucose-Capillary: 179 mg/dL — ABNORMAL HIGH (ref 70–99)

## 2021-01-31 LAB — LACTIC ACID, PLASMA: Lactic Acid, Venous: 1.1 mmol/L (ref 0.5–1.9)

## 2021-01-31 SURGERY — ESOPHAGOGASTRODUODENOSCOPY (EGD) WITH PROPOFOL
Anesthesia: Monitor Anesthesia Care

## 2021-01-31 MED ORDER — DILTIAZEM HCL 25 MG/5ML IV SOLN
10.0000 mg | Freq: Once | INTRAVENOUS | Status: AC
  Administered 2021-01-31: 10 mg via INTRAVENOUS
  Filled 2021-01-31: qty 5

## 2021-01-31 MED ORDER — DILTIAZEM HCL-DEXTROSE 125-5 MG/125ML-% IV SOLN (PREMIX)
5.0000 mg/h | INTRAVENOUS | Status: DC
  Administered 2021-01-31 – 2021-02-02 (×3): 5 mg/h via INTRAVENOUS
  Filled 2021-01-31 (×5): qty 125

## 2021-01-31 MED ORDER — VITAMIN K1 10 MG/ML IJ SOLN
2.0000 mg | Freq: Once | INTRAMUSCULAR | Status: DC
  Filled 2021-01-31 (×2): qty 0.2

## 2021-01-31 MED ORDER — HYDRALAZINE HCL 20 MG/ML IJ SOLN
10.0000 mg | Freq: Four times a day (QID) | INTRAMUSCULAR | Status: DC | PRN
  Administered 2021-02-06: 10 mg via INTRAVENOUS
  Filled 2021-01-31: qty 1

## 2021-01-31 MED ORDER — KCL IN DEXTROSE-NACL 20-5-0.45 MEQ/L-%-% IV SOLN
INTRAVENOUS | Status: DC
  Filled 2021-01-31 (×5): qty 1000

## 2021-01-31 MED ORDER — VITAMIN K1 10 MG/ML IJ SOLN
2.0000 mg | Freq: Once | INTRAVENOUS | Status: AC
  Administered 2021-01-31: 2 mg via INTRAVENOUS
  Filled 2021-01-31: qty 0.2

## 2021-01-31 MED ORDER — SODIUM CHLORIDE 0.9 % IV SOLN: INTRAVENOUS | Status: DC

## 2021-01-31 NOTE — Progress Notes (Signed)
[facility]                                                                              Patient Demographics  Connie Ruiz, is a 60 y.o. female, DOB - Aug 10, 1960, ZOX:[id number]  Admit date - 01/26/2021   Admitting Physician Donnamarie Poag [place] DO  Outpatient Primary MD for the patient is [name], [name], MD  Outpatient specialists:   LOS - 4  days   Medical records reviewed and are as summarized below:    Chief Complaint  Patient presents with   Abdominal Pain       Brief summary   Patient is 60 year old female, [name], ESRD, now status post renal transplant in 2005, hypothyroidism, gout, GERD, hyperlipidemia, diabetes mellitus type 2, SLE, DVT on warfarin presented from [facility] with abdominal pain and diarrhea for 5 days prior to admission.  Patient reported roughly 7 episodes in a day, watery, no hematochezia or melena.  Also reported generalized weakness, fatigue, no fevers or chills. C. difficile positive.  CT abdomen pelvis showed enteritis.  Assessment & Plan    Principal Problem: Non-fulminant severe acute C. difficile colitis with ileus, abdominal pain -Presented with profuse diarrhea, generalized weakness, abdominal pain - C. difficile PCR  Ag positive but toxin negative, high suspicion for C. difficile colitis, hence will treat -Patient continues to have nausea, vomiting, abdominal pain and diarrhea, no significant improvement with oral vancomycin.  -Dc'ed oral vancomycin, placed on Dificid, vancomycin enema after rectal tube placement  -Repeat CT abdomen on 8/25 did not show any perforation bowel obstruction or toxic megacolon  -Continue gentle hydration, scheduled Reglan, as needed Phenergan -GI following, EGD, flex sig in a.m. INR 3.0 today  Active Problems: History of ESRD status post renal transplant, 2005 -Creatinine stable -Continue mycophenolate, tacrolimus, pharmacy following  Essential hypertension, sinus  tachycardia -Likely due to dehydration from fluid loss with diarrhea, C. difficile colitis -Patient unable to take p.o. Cardizem due to nausea and vomiting.  Has allergy to metoprolol. - will place on IV Cardizem, low-dose drip  GERD -Continue IV Pepcid  Hypothyroidism -Continue IV Synthroid 75 MCG daily -TSH 0.5   Diabetes mellitus, type II, IDDM -Hemoglobin A1c 8.2 -CBGs controlled  Hyperlipidemia -Continue atorvastatin  Depression/anxiety -Continue sertraline  History of DVT/PE/CVA -Coumadin currently on hold INR 3.0, endoscopy held -Vitamin K 2 mg IV x1, repeat INR at 4 PM  Hypokalemia, hypomagnesemia -Replaced   History of [name] -Declines any blood products if needed  Obesity Estimated body mass index is 38.11 kg/m as calculated from the following:   Height as of this encounter: 5\' 4"  (1.626 m).   Weight as of this encounter: 100.7 kg.  Code Status: Full CODE STATUS DVT Prophylaxis:    Warfarin   Level of Care: Level of care: Progressive Family Communication: Discussed all imaging results, lab results, explained to the patient   Disposition Plan:     Status is: Inpatient  Remains inpatient appropriate because:Inpatient level of care appropriate due to severity of illness  Dispo: The patient is from: SNF  Anticipated d/c is to: SNF              Patient currently is not medically stable to d/c.  Unable to tolerate p.o. diet.  Transfer to progressive floor   Difficult to place patient No      Time Spent in minutes   25 minutes  Procedures:  None  Consultants:   Gastroenterology  Antimicrobials:   Anti-infectives (From admission, onward)    Start     Dose/Rate Route Frequency Ordered Stop   01/30/21 2200  fidaxomicin (DIFICID) tablet 200 mg        200 mg Oral 2 times daily 01/30/21 1832 02/09/21 2159   01/30/21 2000  vancomycin (VANCOCIN) 500 mg in sodium chloride irrigation 0.9 % 100 mL ENEMA        500 mg Rectal  Every 6 hours 01/30/21 1838 02/13/21 1559   01/27/21 2200  cefTRIAXone (ROCEPHIN) 2 g in sodium chloride 0.9 % 100 mL IVPB  Status:  Discontinued        2 g 200 mL/hr over 30 Minutes Intravenous Every 24 hours 01/27/21 0956 01/27/21 1136   01/27/21 1200  vancomycin (VANCOCIN) capsule 125 mg  Status:  Discontinued        125 mg Oral 4 times daily 01/27/21 1135 01/30/21 1832   01/27/21 1000  metroNIDAZOLE (FLAGYL) IVPB 500 mg  Status:  Discontinued        500 mg 100 mL/hr over 60 Minutes Intravenous Every 12 hours 01/27/21 0901 01/27/21 1135   01/27/21 0115  cefTRIAXone (ROCEPHIN) 2 g in sodium chloride 0.9 % 100 mL IVPB        2 g 200 mL/hr over 30 Minutes Intravenous  Once 01/27/21 0103 01/27/21 0411   01/27/21 0115  metroNIDAZOLE (FLAGYL) IVPB 500 mg  Status:  Discontinued        500 mg 100 mL/hr over 60 Minutes Intravenous Every 8 hours 01/27/21 0103 01/27/21 0901          Medications  Scheduled Meds:  donepezil  10 mg Oral QHS   fidaxomicin  200 mg Oral BID   hydrocortisone sod succinate (SOLU-CORTEF) inj  40 mg Intravenous BID   insulin aspart  0-5 Units Subcutaneous QHS   insulin aspart  0-9 Units Subcutaneous TID WC   levothyroxine  75 mcg Intravenous Daily   metoCLOPramide (REGLAN) injection  10 mg Intravenous Q6H   mirtazapine  30 mg Oral QHS   mycophenolate  250 mg Oral BID   tacrolimus  2.5 mg Oral Q12H   vancomycin (VANCOCIN) rectal ENEMA  500 mg Rectal Q6H   Continuous Infusions:  sodium chloride     dextrose 5 % and 0.45 % NaCl with KCl 20 mEq/L     diltiazem (CARDIZEM) infusion     famotidine (PEPCID) IV 20 mg (01/31/21 0954)   phytonadione (VITAMIN K) IV     promethazine (PHENERGAN) injection (IM or IVPB)     PRN Meds:.acetaminophen **OR** acetaminophen, fentaNYL (SUBLIMAZE) injection, hydrALAZINE, promethazine (PHENERGAN) injection (IM or IVPB)      Subjective:   Connie Ruiz was seen and examined today.  Continues to have abdominal pain, nausea and  vomiting.  Tachycardia no fevers.  Objective:   Vitals:   01/30/21 2131 01/31/21 0500 01/31/21 0555 01/31/21 1232  BP: (!) 145/87  (!) 156/98 (!) 156/84  Pulse: 100  (!) 108 (!) 107  Resp: 16  16 17   Temp: 98 F (36.7 C)  97.8 F (36.6 C) 98.4 F (  36.9 C)  TempSrc: Oral  Oral Oral  SpO2: 99%  98% 98%  Weight:  100.7 kg    Height:        Intake/Output Summary (Last 24 hours) at 01/31/2021 1337 Last data filed at 01/31/2021 1049 Gross per 24 hour  Intake 60 ml  Output --  Net 60 ml     Wt Readings from Last 3 Encounters:  01/31/21 100.7 kg  08/21/20 88.9 kg  09/11/19 78.9 kg   Physical Exam General: Alert and oriented x 3, uncomfortable Cardiovascular: S1 S2 clear, RRR.  Trace pedal edema b/l Respiratory: CTAB, anteriorly, no wheezing Gastrointestinal: Soft, diffuse TTP, NBS Ext: trace pedal edema bilaterally Psych: uncomfortable    Data Reviewed:  I have personally reviewed following labs and imaging studies  Micro Results Recent Results (from the past 240 hour(s))  Resp Panel by RT-PCR (Flu A&B, Covid) Nasopharyngeal Swab     Status: None   Collection Time: 01/26/21  9:56 PM   Specimen: Nasopharyngeal Swab; Nasopharyngeal(NP) swabs in vial transport medium  Result Value Ref Range Status   SARS Coronavirus 2 by RT PCR NEGATIVE NEGATIVE Final    Comment: (NOTE) SARS-CoV-2 target nucleic acids are NOT DETECTED.  The SARS-CoV-2 RNA is generally detectable in upper respiratory specimens during the acute phase of infection. The lowest concentration of SARS-CoV-2 viral copies this assay can detect is 138 copies/mL. A negative result does not preclude SARS-Cov-2 infection and should not be used as the sole basis for treatment or other patient management decisions. A negative result may occur with  improper specimen collection/handling, submission of specimen other than nasopharyngeal swab, presence of viral mutation(s) within the areas targeted by this assay, and  inadequate number of viral copies(<138 copies/mL). A negative result must be combined with clinical observations, patient history, and epidemiological information. The expected result is Negative.  Fact Sheet for Patients:  EntrepreneurPulse.com.au  Fact Sheet for Healthcare Providers:  IncredibleEmployment.be  This test is no t yet approved or cleared by the [place] FDA and  has been authorized for detection and/or diagnosis of SARS-CoV-2 by FDA under an Emergency Use Authorization (EUA). This EUA will remain  in effect (meaning this test can be used) for the duration of the COVID-19 declaration under Section 564(b)(1) of the Act, 21 U.S.C.section 360bbb-3(b)(1), unless the authorization is terminated  or revoked sooner.       Influenza A by PCR NEGATIVE NEGATIVE Final   Influenza B by PCR NEGATIVE NEGATIVE Final    Comment: (NOTE) The Xpert Xpress SARS-CoV-2/FLU/RSV plus assay is intended as an aid in the diagnosis of influenza from Nasopharyngeal swab specimens and should not be used as a sole basis for treatment. Nasal washings and aspirates are unacceptable for Xpert Xpress SARS-CoV-2/FLU/RSV testing.  Fact Sheet for Patients: EntrepreneurPulse.com.au  Fact Sheet for Healthcare Providers: IncredibleEmployment.be  This test is not yet approved or cleared by the [place] FDA and has been authorized for detection and/or diagnosis of SARS-CoV-2 by FDA under an Emergency Use Authorization (EUA). This EUA will remain in effect (meaning this test can be used) for the duration of the COVID-19 declaration under Section 564(b)(1) of the Act, 21 U.S.C. section 360bbb-3(b)(1), unless the authorization is terminated or revoked.  Performed at [facility], [place] [address] [place], [place] [address]   Blood culture (routine x 2)     Status: None (Preliminary result)   Collection  Time: 01/27/21 12:32 AM   Specimen: Left Antecubital; Blood  Result  Value Ref Range Status   Specimen Description   Final    LEFT ANTECUBITAL Performed at [facility] [address] [place], [place] [address]    Special Requests   Final    BOTTLES DRAWN AEROBIC AND ANAEROBIC Blood Culture adequate volume Performed at [facility] [address], [place], [place] [address]    Culture   Final    NO GROWTH 4 DAYS Performed at [facility], [place] [address] [place], [place] [address]    Report Status PENDING  Incomplete  Blood culture (routine x 2)     Status: None (Preliminary result)   Collection Time: 01/27/21  3:21 AM   Specimen: Left Antecubital; Blood  Result Value Ref Range Status   Specimen Description   Final    LEFT ANTECUBITAL Performed at [facility] [address], [place], [place] [address]    Special Requests   Final    BOTTLES DRAWN AEROBIC AND ANAEROBIC Blood Culture results may not be optimal due to an excessive volume of blood received in culture bottles Performed at [facility] [address], [place], [place] [address]    Culture   Final    NO GROWTH 4 DAYS Performed at [facility], [place] [address] [place], [place] [address]    Report Status PENDING  Incomplete  Gastrointestinal Panel by PCR , Stool     Status: None   Collection Time: 01/27/21  6:33 AM   Specimen: Stool  Result Value Ref Range Status   Campylobacter species NOT DETECTED NOT DETECTED Final   Plesimonas shigelloides NOT DETECTED NOT DETECTED Final   Salmonella species NOT DETECTED NOT DETECTED Final   Yersinia enterocolitica NOT DETECTED NOT DETECTED Final   Vibrio species NOT DETECTED NOT DETECTED Final   Vibrio cholerae NOT DETECTED NOT DETECTED Final   Enteroaggregative E coli (EAEC) NOT DETECTED NOT DETECTED Final   Enteropathogenic E coli (EPEC) NOT DETECTED NOT DETECTED Final    Enterotoxigenic E coli (ETEC) NOT DETECTED NOT DETECTED Final   Shiga like toxin producing E coli (STEC) NOT DETECTED NOT DETECTED Final   Shigella/Enteroinvasive E coli (EIEC) NOT DETECTED NOT DETECTED Final   Cryptosporidium NOT DETECTED NOT DETECTED Final   Cyclospora cayetanensis NOT DETECTED NOT DETECTED Final   Entamoeba histolytica NOT DETECTED NOT DETECTED Final   Giardia lamblia NOT DETECTED NOT DETECTED Final   Adenovirus F40/41 NOT DETECTED NOT DETECTED Final   Astrovirus NOT DETECTED NOT DETECTED Final   Norovirus GI/GII NOT DETECTED NOT DETECTED Final   Rotavirus A NOT DETECTED NOT DETECTED Final   Sapovirus (I, II, IV, and V) NOT DETECTED NOT DETECTED Final    Comment: Performed at [facility], [place] [place], [place] [address]  C Difficile Quick Screen w PCR reflex     Status: Abnormal   Collection Time: 01/27/21  6:33 AM   Specimen: Stool  Result Value Ref Range Status   C Diff antigen POSITIVE (A) NEGATIVE Final   C Diff toxin NEGATIVE NEGATIVE Final   C Diff interpretation Results are indeterminate. See PCR results.  Final    Comment: Performed at [facility], [place] [address], [place], [place] [address]  C. Diff by PCR, Reflexed     Status: Abnormal   Collection Time: 01/27/21  6:33 AM  Result Value Ref Range Status   Toxigenic C. Difficile by PCR POSITIVE (A) NEGATIVE Final  Comment: Positive for toxigenic C. difficile with little to no toxin production. Only treat if clinical presentation suggests symptomatic illness. Performed at [facility], [place] [address] [place], [place] [address]     Radiology Reports CT ABDOMEN PELVIS WO CONTRAST  Result Date: 01/29/2021 CLINICAL DATA:  Bowel obstruction suspected. Abdominal pain, acute, nonlocalized. End-stage renal disease with previous renal transplant. EXAM: CT ABDOMEN AND PELVIS WITHOUT CONTRAST TECHNIQUE: Multidetector CT imaging of the abdomen and pelvis was performed  following the standard protocol without IV contrast. COMPARISON:  01/26/2021 FINDINGS: Lower chest: Elevation of the right hemidiaphragm with mild atelectasis at the right lung base, similar to the previous study. Extensive coronary artery calcification. Hepatobiliary: Previous cholecystectomy.  No focal liver lesion. Pancreas: Normal Spleen: Normal Adrenals/Urinary Tract: Adrenal glands are normal. Markedly atrophic native kidneys without significant finding. Small cysts as seen previously. Transplant kidney in the right iliac fossa without abnormal finding by CT. Bladder appears normal. Stomach/Bowel: Stomach and small intestine are normal. Diverticulosis of the colon but no evidence of diverticulitis. No bowel obstruction or focal bowel lesion. Vascular/Lymphatic: Aortic atherosclerosis. No aneurysm. IVC is normal. No retroperitoneal adenopathy. Reproductive: No pelvic mass. Other: No free fluid or air. Musculoskeletal: No significant skeletal finding. IMPRESSION: No bowel obstruction or acute bowel pathology. Diverticulosis without evidence of diverticulitis. Elevation of the right hemidiaphragm with atelectasis at the right lung base. Atrophic native kidneys. Grossly normal appearing transplant kidney in the right iliac fossa. Aortic atherosclerosis. Widespread vascular calcification consistent with chronic renal failure. Electronically Signed   By: Nelson Chimes M.D.   On: 01/29/2021 16:10   CT ABDOMEN PELVIS WO CONTRAST  Result Date: 01/26/2021 CLINICAL DATA:  Concern for bowel obstruction. EXAM: CT ABDOMEN AND PELVIS WITHOUT CONTRAST TECHNIQUE: Multidetector CT imaging of the abdomen and pelvis was performed following the standard protocol without IV contrast. COMPARISON:  CT abdomen pelvis dated 08/21/2020. FINDINGS: Evaluation of this exam is limited in the absence of intravenous contrast. Lower chest: Minimal bibasilar atelectasis. The visualized lung bases are otherwise clear. There is advanced 3  vessel coronary vascular calcification. No intra-abdominal free air or free fluid. Hepatobiliary: The liver is unremarkable. No intrahepatic biliary dilatation. Cholecystectomy. No retained calcified stone noted in the central CBD. Pancreas: Unremarkable. No pancreatic ductal dilatation or surrounding inflammatory changes. Spleen: Normal in size without focal abnormality. Adrenals/Urinary Tract: The adrenal glands are unremarkable. Atrophic native kidneys. Bilateral renal parenchyma nodularity similar to prior CT may represent residual renal parenchyma. A mass is less likely but not excluded. The visualized ureters appear unremarkable. The urinary bladder is collapsed. There is a right lower quadrant renal transplant. There is no hydronephrosis or nephrolithiasis of the transplant kidney. No peritransplant fluid collection. Stomach/Bowel: There is diffuse colonic diverticulosis without active inflammatory changes. There is mild thickened appearance of the jejunal folds with engorgement of the associated mesentery. Clinical correlation is recommended to evaluate for possibility of enteritis. There is no bowel obstruction. The appendix is normal. Vascular/Lymphatic: Moderate aortoiliac atherosclerotic disease. The IVC is unremarkable. No portal venous gas. There is no adenopathy. Reproductive: The uterus is grossly unremarkable. No adnexal masses. Other: Mild diffuse subcutaneous stranding.  No fluid collection. Musculoskeletal: Osteopenia with degenerative changes of the spine. Multiple old left rib fractures. No acute osseous pathology. IMPRESSION: 1. Mild thickened appearance of the jejunal folds with engorgement of the associated mesentery. Clinical correlation is recommended to evaluate for possibility of enteritis. No bowel obstruction. Normal appendix. 2. Colonic diverticulosis. 3. Atrophic native kidneys with a  right lower quadrant renal transplant. No hydronephrosis or nephrolithiasis. 4. Aortic  Atherosclerosis (ICD10-I70.0). Electronically Signed   By: Anner Crete M.D.   On: 01/26/2021 20:23   DG Chest 2 View  Result Date: 01/26/2021 CLINICAL DATA:  Lower abdominal pain since Friday, tachycardia EXAM: CHEST - 2 VIEW COMPARISON:  08/28/2020 FINDINGS: Frontal and lateral views of the chest demonstrate an unremarkable cardiac silhouette. No acute airspace disease, effusion, or pneumothorax. No acute bony abnormalities. IMPRESSION: 1. No acute intrathoracic process. Electronically Signed   By: Randa Ngo M.D.   On: 01/26/2021 22:17   DG Abd 2 Views  Result Date: 01/29/2021 CLINICAL DATA:  Abdominal pain EXAM: ABDOMEN - 2 VIEW COMPARISON:  None. FINDINGS: There is mildly dilated of the cecum. Nondistended small bowel. No acute osseous abnormality. There are soft tissue calcifications consistent with subcutaneous calcification seen on prior CT. IMPRESSION: Mild dilation of the cecum. If there is concern for obstruction recommend CT. Electronically Signed   By: Maurine Simmering M.D.   On: 01/29/2021 09:25    Lab Data:  CBC: Recent Labs  Lab 01/26/21 1859 01/28/21 0519 01/29/21 0515 01/30/21 0526 01/31/21 0713  WBC 12.4* 10.3 8.0 8.6 9.4  NEUTROABS 8.3*  --   --   --   --   HGB 14.4 11.3* 12.3 11.1* 12.3  HCT 46.6* 38.0 45.2 36.7 39.9  MCV 86.8 90.7 100.0 89.1 87.3  PLT 334 254 262 238 106   Basic Metabolic Panel: Recent Labs  Lab 01/26/21 1859 01/28/21 0519 01/29/21 0515 01/30/21 0526 01/31/21 0713  NA 142 140 141 139 140  K 3.8 3.7 3.6 3.3* 3.6  CL 109 115* 113* 112* 111  CO2 23 21* 20* 22 19*  GLUCOSE 152* 99 97 79 205*  BUN 16 12 7  <5* 7  CREATININE 0.60 0.53 0.54 0.40* 0.61  CALCIUM 9.6 8.6* 8.3* 8.1* 8.6*  MG  --   --  1.5* 1.1* 2.0   GFR: Estimated Creatinine Clearance: 86.3 mL/min (by C-G formula based on SCr of 0.61 mg/dL). Liver Function Tests: Recent Labs  Lab 01/26/21 1859 01/28/21 0519  AST 15 15  ALT 14 11  ALKPHOS 67 50  BILITOT 0.9 0.5   PROT 7.4 5.8*  ALBUMIN 3.6 2.9*   Recent Labs  Lab 01/26/21 1859 01/29/21 0515  LIPASE 27 26   No results for input(s): AMMONIA in the last 168 hours. Coagulation Profile: Recent Labs  Lab 01/26/21 2306 01/28/21 0519 01/29/21 0515 01/30/21 0526 01/31/21 0713  INR 2.3* 3.1* 3.0* 3.0* 3.0*   Cardiac Enzymes: No results for input(s): CKTOTAL, CKMB, CKMBINDEX, TROPONINI in the last 168 hours. BNP (last 3 results) No results for input(s): PROBNP in the last 8760 hours. HbA1C: No results for input(s): HGBA1C in the last 72 hours.  CBG: Recent Labs  Lab 01/30/21 1217 01/30/21 1753 01/30/21 2134 01/31/21 0716 01/31/21 1129  GLUCAP 111* 107* 131* 158* 142*   Lipid Profile: No results for input(s): CHOL, HDL, LDLCALC, TRIG, CHOLHDL, LDLDIRECT in the last 72 hours. Thyroid Function Tests: No results for input(s): TSH, T4TOTAL, FREET4, T3FREE, THYROIDAB in the last 72 hours.  Anemia Panel: No results for input(s): VITAMINB12, FOLATE, FERRITIN, TIBC, IRON, RETICCTPCT in the last 72 hours. Urine analysis:    Component Value Date/Time   COLORURINE YELLOW 01/26/2021 2156   APPEARANCEUR CLEAR 01/26/2021 2156   LABSPEC >1.030 (H) 01/26/2021 2156   PHURINE 6.0 01/26/2021 2156   GLUCOSEU NEGATIVE 01/26/2021 2156   GLUCOSEU NEGATIVE 10/22/2013  [place] 01/26/2021 2156   BILIRUBINUR MODERATE (A) 01/26/2021 2156   KETONESUR 40 (A) 01/26/2021 2156   PROTEINUR TRACE (A) 01/26/2021 2156   UROBILINOGEN 1.0 03/27/2015 1534   NITRITE NEGATIVE 01/26/2021 2156   LEUKOCYTESUR NEGATIVE 01/26/2021 2156     Micha Dosanjh M.D. [facility] 01/31/2021, 1:37 PM  Available via Epic secure chat 7am-7pm After 7 pm, please refer to night coverage provider listed on amion.

## 2021-01-31 NOTE — Progress Notes (Signed)
PT Cancellation Note  Patient Details Name: Connie Ruiz MRN: [id number] DOB: [birth year]   Cancelled Treatment:    Reason Eval/Treat Not Completed: Medical issues which prohibited therapy Pt transferred to progressive care and placed on cardizem drip.  HR 379'K, Diastolic BP was 240.  Therapy held due to decline in medical status.  Did attempt to call [facility] to determine PLOF but no answer (pt had told OT bed bound but then mentioned standing with therapy to PT).  Will f/u as able.   Abran Richard, PT Acute Rehab Services Pager [phone] [facility] [place] 01/31/2021, 4:21 PM

## 2021-01-31 NOTE — Progress Notes (Signed)
Patient being transferred to 4E room 1408. Patient and family informed. Bedside report being given to nurse.

## 2021-01-31 NOTE — Progress Notes (Addendum)
UNASSIGNED PATIENT  Subjective: Connie Ruiz is a 60 year old black female with multiple medical problems including a acute colitis with ileus and severe abdominal pain noted to be C. difficile PCR antigen positive but toxin negative.  As per my discussion with Dr. Joya Gaskins today she has been changed from vancomycin to Dificid along with vancomycin enemas to rectal tube.  Patient is not very conversant and keeps saying she is just not feeling very well.  Abdomen continues to hurt and she has had nausea but has not vomited today. CT of the abdomen and pelvis done on 01/27/2019 revealed diffuse colonic diverticulosis without active inflammatory changes and mild thickening of the jejunal folds with engorgement of the associated mesentery spacious for enteritis; old rib fractures and diffuse atherosclerotic disease was also noted.  A repeat noncontrasted CT done today showed diverticulosis without evidence of diverticulitis with elevation of the right hemidiaphragm with atelectasis at the right lung base and widespread vascular calcification; the stomach and small bowel appeared normal.  Objective: Vital signs in last 24 hours: Temp:  [97.8 F (36.6 C)-99 F (37.2 C)] 98.9 F (37.2 C) (08/27 1450) Pulse Rate:  [100-112] 109 (08/27 1600) Resp:  [14-22] 14 (08/27 1600) BP: (145-162)/(84-104) 148/104 (08/27 1600) SpO2:  [97 %-99 %] 97 % (08/27 1600) Weight:  [100.7 kg] 100.7 kg (08/27 0500) Last BM Date: 01/31/21  Intake/Output from previous day: 08/26 0701 - 08/27 0700 In: 60 [P.O.:60] Out: -  Intake/Output this shift: No intake/output data recorded.  General appearance: cooperative, appears stated age, fatigued, and in moderate distress Resp: clear to auscultation bilaterally Cardio: Tachycardic regular rate and rhythm, S1, S2 normal, no murmur, click, rub or gallop GI: soft, obese tender on palpation with guarding but without rebound or rigidity; hypoactive bowel sounds normal; no masses,  no  organomegaly  Lab Results: Recent Labs    01/29/21 0515 01/30/21 0526 01/31/21 0713  WBC 8.0 8.6 9.4  HGB 12.3 11.1* 12.3  HCT 45.2 36.7 39.9  PLT 262 238 283   BMET Recent Labs    01/29/21 0515 01/30/21 0526 01/31/21 0713  NA 141 139 140  K 3.6 3.3* 3.6  CL 113* 112* 111  CO2 20* 22 19*  GLUCOSE 97 79 205*  BUN 7 <5* 7  CREATININE 0.54 0.40* 0.61  CALCIUM 8.3* 8.1* 8.6*   LFT No results for input(s): PROT, ALBUMIN, AST, ALT, ALKPHOS, BILITOT, BILIDIR, IBILI in the last 72 hours. PT/INR Recent Labs    01/30/21 0526 01/31/21 0713  LABPROT 30.9* 31.2*  INR 3.0* 3.0*   Studies/Results: No results found.  Medications: I have reviewed the patient's current medications. Prior to Admission:  Medications Prior to Admission  Medication Sig Dispense Refill Last Dose   acetaminophen (TYLENOL) 500 MG tablet Take 1,000 mg by mouth 4 (four) times daily as needed for mild pain or moderate pain.   Past Month   alendronate (FOSAMAX) 70 MG tablet TAKE 1 TABLET BY MOUTH ONCE A WEEK ON AN EMPTY STOMACH ON SATURDAYS WITH A FULL GLASS OF WATER. (Patient taking differently: Take 70 mg by mouth once a week.) 12 tablet 0 01/24/2021   atorvastatin (LIPITOR) 40 MG tablet Take 40 mg by mouth daily at 6 PM.    01/25/2021   Blood Glucose Monitoring Suppl (ACCU-CHEK AVIVA PLUS) w/Device KIT 1 Device by Does not apply route daily. 1 kit 1    cholecalciferol (VITAMIN D3) 25 MCG (1000 UNIT) tablet Take 1,000 Units by mouth daily.   01/26/2021  diltiazem (TIAZAC) 360 MG 24 hr capsule Take 1 capsule (360 mg total) by mouth daily. 90 capsule 1 01/26/2021   donepezil (ARICEPT) 10 MG tablet Take 10 mg by mouth at bedtime.    01/25/2021   esomeprazole (NEXIUM) 20 MG capsule TAKE 1 CAPSULE(20 MG) BY MOUTH DAILY (Patient taking differently: Take 20 mg by mouth daily at 12 noon.) 30 capsule 11 01/26/2021   famotidine (PEPCID) 20 MG tablet Take 20 mg by mouth 2 (two) times daily.   01/26/2021   feeding  supplement, ENSURE ENLIVE, (ENSURE ENLIVE) LIQD Take 237 mLs by mouth 2 (two) times daily between meals. (Patient taking differently: Take 237 mLs by mouth as needed (when pt remembers).)   01/26/2021   furosemide (LASIX) 20 MG tablet Take 20 mg by mouth daily.   01/26/2021   gabapentin (NEURONTIN) 300 MG capsule Take 300 mg by mouth 2 (two) times daily.   01/26/2021   glucose blood (ONE TOUCH ULTRA TEST) test strip Use to check blood sugar 1 time per day 100 each 2    insulin lispro (HUMALOG) 100 UNIT/ML injection Inject 0-12 Units into the skin 3 (three) times daily before meals. Per Sliding Scale. If blood sugar is: 0-150: 0 units 151-200: 2 units 201-250: 4 units 251-300: 6 units 301-350: 8 units 351-400: 10 units 401-450: 12 units   01/26/2021   levothyroxine (SYNTHROID) 175 MCG tablet TAKE 1 TABLET(175 MCG) BY MOUTH DAILY BEFORE BREAKFAST (Patient taking differently: Take 150 mcg by mouth daily before breakfast.) 30 tablet 0 01/25/2021   losartan (COZAAR) 100 MG tablet Take 1 tablet (100 mg total) by mouth daily. 90 tablet 1 01/26/2021   magnesium oxide (MAG-OX) 400 MG tablet Take 800 mg by mouth daily.    01/26/2021   metFORMIN (GLUCOPHAGE-XR) 500 MG 24 hr tablet TAKE 1 TABLET(500 MG) BY MOUTH DAILY WITH BREAKFAST (Patient taking differently: Take 500 mg by mouth daily with breakfast.) 90 tablet 0 01/26/2021   mirtazapine (REMERON) 30 MG tablet Take 1 tablet (30 mg total) by mouth at bedtime. 30 tablet 1 01/25/2021   multivitamin (RENA-VIT) TABS tablet Take 1 tablet by mouth daily.   01/26/2021   mycophenolate (CELLCEPT) 250 MG capsule Take 250 mg by mouth 2 (two) times daily.    01/26/2021   NONFORMULARY OR COMPOUNDED ITEM [facility]:  Pain Cream - Ketamine 5%, Baclofen 2%, Gabapentin 5%, Lidocaine 5%, Menthol 1%, apply 1-2 grams to affected area 3-4 times a day for pain. 100 each 3    nystatin (MYCOSTATIN) powder Apply 1 g topically 4 (four) times daily as needed. For yeast under breast    PRN   ondansetron (ZOFRAN ODT) 4 MG disintegrating tablet 28m ODT q4 hours prn nausea/vomiting (Patient taking differently: Take 4 mg by mouth every 4 (four) hours as needed for nausea or vomiting.) 15 tablet 0 PRN   ONETOUCH DELICA LANCETS 387FMISC Use to check blood sugar 1 time per day 100 each 2    Potassium Chloride ER 20 MEQ TBCR Take 1 tablet by mouth daily. NEEDS POTASSIUM RE-CHECK. 90 tablet 1 01/26/2021   predniSONE (DELTASONE) 5 MG tablet Take 5 mg by mouth daily with breakfast.    01/26/2021   psyllium (METAMUCIL) 58.6 % packet Take 1 packet by mouth daily.   01/25/2021   sertraline (ZOLOFT) 100 MG tablet Take 0.5 tablets (50 mg total) by mouth daily. (Patient taking differently: Take 100 mg by mouth daily.) 15 tablet 2 01/26/2021   Skin Protectants, Misc. (EUCERIN)  cream Apply 1 application topically daily. To feet   01/26/2021   tacrolimus (PROGRAF) 1 MG capsule Take 2.5 mg by mouth every 12 (twelve) hours.   01/26/2021 at 0900   traMADol (ULTRAM) 50 MG tablet Take 100 mg by mouth every 6 (six) hours as needed for pain.   01/23/2021   warfarin (COUMADIN) 5 MG tablet TAKE 1/2 TO 1 TABLET BY MOUTH ON MONDAY THROUGH FRIDAY; TUESDAY, WEDNESDAY, THURSDAY, SATAND SUNDAY-2.5MG (Patient taking differently: Take 6.5 mg by mouth daily.) 50 tablet 0 01/25/2021   calcitRIOL (ROCALTROL) 0.25 MCG capsule TAKE 1 CAPSULE(0.25 MCG) BY MOUTH DAILY. NO REFILLS WITHOUT APPOINTMENT (Patient not taking: No sig reported) 30 capsule 0 Not Taking   morphine (MSIR) 15 MG tablet Take 0.5 tablets (7.5 mg total) by mouth every 6 (six) hours as needed for severe pain. (Patient not taking: No sig reported) 4 tablet 0 Not Taking   QUEtiapine (SEROQUEL) 25 MG tablet Take 1 tablet (25 mg total) by mouth at bedtime. (Patient not taking: No sig reported) 30 tablet 1 Not Taking   sucralfate (CARAFATE) 1 g tablet Take 1 tablet (1 g total) by mouth 4 (four) times daily for 30 days. (Patient not taking: Reported on 01/26/2021)   Not  Taking   Scheduled:  donepezil  10 mg Oral QHS   fidaxomicin  200 mg Oral BID   hydrocortisone sod succinate (SOLU-CORTEF) inj  40 mg Intravenous BID   insulin aspart  0-5 Units Subcutaneous QHS   insulin aspart  0-9 Units Subcutaneous TID WC   levothyroxine  75 mcg Intravenous Daily   metoCLOPramide (REGLAN) injection  10 mg Intravenous Q6H   mirtazapine  30 mg Oral QHS   mycophenolate  250 mg Oral BID   tacrolimus  2.5 mg Oral Q12H   vancomycin (VANCOCIN) rectal ENEMA  500 mg Rectal Q6H   Continuous:  dextrose 5 % and 0.45 % NaCl with KCl 20 mEq/L 50 mL/hr at 01/31/21 1737   diltiazem (CARDIZEM) infusion 5 mg/hr (01/31/21 1550)   famotidine (PEPCID) IV 20 mg (01/31/21 0954)   promethazine (PHENERGAN) injection (IM or IVPB)     Assessment/Plan: 1) Acute colitis ?C. difficile with abdominal pain-Dificid now with vancomycin enemas.  We will check lactic acid level today. 2) History of end-stage renal disease s/p renal transplant 2005. 3) IDDM/hypertension/hyperlipidemia. 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 5) GERD on IV Pepcid. 6) Nausea without vomiting. 7) Depression/anxiety disorder. 8) [name].  LOS: 4 days   [name] 01/31/2021, 6:23 PM

## 2021-01-31 NOTE — Progress Notes (Signed)
Pt received from 5 e. Condition stable. HR 104-107. Pt alert and oriented. Skin assessed by this RN and Brien Mates.

## 2021-02-01 DIAGNOSIS — R109 Unspecified abdominal pain: Secondary | ICD-10-CM | POA: Diagnosis not present

## 2021-02-01 DIAGNOSIS — K529 Noninfective gastroenteritis and colitis, unspecified: Secondary | ICD-10-CM | POA: Diagnosis not present

## 2021-02-01 DIAGNOSIS — A0472 Enterocolitis due to Clostridium difficile, not specified as recurrent: Secondary | ICD-10-CM | POA: Diagnosis not present

## 2021-02-01 DIAGNOSIS — I5032 Chronic diastolic (congestive) heart failure: Secondary | ICD-10-CM | POA: Diagnosis not present

## 2021-02-01 LAB — COMPREHENSIVE METABOLIC PANEL
ALT: 10 U/L (ref 0–44)
AST: 12 U/L — ABNORMAL LOW (ref 15–41)
Albumin: 2.9 g/dL — ABNORMAL LOW (ref 3.5–5.0)
Alkaline Phosphatase: 47 U/L (ref 38–126)
Anion gap: 5 (ref 5–15)
BUN: 8 mg/dL (ref 6–20)
CO2: 23 mmol/L (ref 22–32)
Calcium: 8.7 mg/dL — ABNORMAL LOW (ref 8.9–10.3)
Chloride: 112 mmol/L — ABNORMAL HIGH (ref 98–111)
Creatinine, Ser: 0.45 mg/dL (ref 0.44–1.00)
GFR, Estimated: >60 mL/min (ref >60)
Glucose, Bld: 197 mg/dL — ABNORMAL HIGH (ref 70–99)
Potassium: 3.6 mmol/L (ref 3.5–5.1)
Sodium: 140 mmol/L (ref 135–145)
Total Bilirubin: 0.4 mg/dL (ref 0.3–1.2)
Total Protein: 6.1 g/dL — ABNORMAL LOW (ref 6.5–8.1)

## 2021-02-01 LAB — CBC
HCT: 36.7 % (ref 36.0–46.0)
Hemoglobin: 11.8 g/dL — ABNORMAL LOW (ref 12.0–15.0)
MCH: 27.4 pg (ref 26.0–34.0)
MCHC: 32.2 g/dL (ref 30.0–36.0)
MCV: 85.2 fL (ref 80.0–100.0)
Platelets: 276 10*3/uL (ref 150–400)
RBC: 4.31 MIL/uL (ref 3.87–5.11)
RDW: 15.1 % (ref 11.5–15.5)
WBC: 10.7 10*3/uL — ABNORMAL HIGH (ref 4.0–10.5)
nRBC: 0 % (ref 0.0–0.2)

## 2021-02-01 LAB — HEPARIN LEVEL (UNFRACTIONATED): Heparin Unfractionated: 0.41 IU/mL (ref 0.30–0.70)

## 2021-02-01 LAB — CULTURE, BLOOD (ROUTINE X 2)
Culture: NO GROWTH
Culture: NO GROWTH
Special Requests: ADEQUATE

## 2021-02-01 LAB — GLUCOSE, CAPILLARY
Glucose-Capillary: 177 mg/dL — ABNORMAL HIGH (ref 70–99)
Glucose-Capillary: 137 mg/dL — ABNORMAL HIGH (ref 70–99)
Glucose-Capillary: 180 mg/dL — ABNORMAL HIGH (ref 70–99)

## 2021-02-01 LAB — PROTIME-INR
INR: 1.3 — ABNORMAL HIGH (ref 0.8–1.2)
Prothrombin Time: 16.2 seconds — ABNORMAL HIGH (ref 11.4–15.2)

## 2021-02-01 MED ORDER — HEPARIN (PORCINE) 25000 UT/250ML-% IV SOLN
750.0000 [IU]/h | INTRAVENOUS | Status: DC
  Administered 2021-02-01: 1100 [IU]/h via INTRAVENOUS
  Administered 2021-02-02: 750 [IU]/h via INTRAVENOUS
  Filled 2021-02-01 (×2): qty 250

## 2021-02-01 MED ORDER — HYDROCORTISONE NA SUCCINATE PF 100 MG IJ SOLR
25.0000 mg | Freq: Two times a day (BID) | INTRAMUSCULAR | Status: DC
  Administered 2021-02-01 – 2021-02-03 (×4): 25 mg via INTRAVENOUS
  Filled 2021-02-01 (×4): qty 2

## 2021-02-01 NOTE — Progress Notes (Signed)
Patient refusing another PIV to start heparin drip, patient asking for Inter-jugular IV line when IV team came to start a new PIV, patient educated and now agree for PIV, IV team re-consulted. Waiting for a new PIV line to start heparin drip.

## 2021-02-01 NOTE — Progress Notes (Signed)
UNASSIGNED PATYIENT Subjective: Since I last evaluated the patient, she seems to be doing much better today. Her abdominal pain and nausea have improved and so has a tachycardia and tachypnea.  Objective: Vital signs in last 24 hours: Temp:  [98.2 F (36.8 C)-98.9 F (37.2 C)] 98.2 F (36.8 C) (08/28 0546) Pulse Rate:  [68-109] 105 (08/28 0600) Resp:  [9-17] 10 (08/28 0600) BP: (146-172)/(80-104) 159/101 (08/28 0600) SpO2:  [94 %-99 %] 99 % (08/28 0600) Weight:  [95.6 kg-97.7 kg] 95.6 kg (08/28 0546) Last BM Date: 01/31/21  Intake/Output from previous day: 08/27 0701 - 08/28 0700 In: 897.1 [I.V.:847.1; IV Piggyback:50] Out: -  Intake/Output this shift: No intake/output data recorded.  General appearance: alert, cooperative, appears older than stated age, flushed, and morbidly obese Resp: clear to auscultation bilaterally Cardio: regular rate and rhythm, S1, S2 normal, no murmur, click, rub or gallop GI: soft, mild diffuse tenderness on palpation with guarding but without rebound or rigidity; hyperactive bowel sounds; no masses,  no organomegaly  Lab Results: Recent Labs    01/30/21 0526 01/31/21 0713 02/01/21 0618  WBC 8.6 9.4 10.7*  HGB 11.1* 12.3 11.8*  HCT 36.7 39.9 36.7  PLT 238 283 276   BMET Recent Labs    01/30/21 0526 01/31/21 0713 02/01/21 0618  NA 139 140 140  K 3.3* 3.6 3.6  CL 112* 111 112*  CO2 22 19* 23  GLUCOSE 79 205* 197*  BUN <5* 7 8  CREATININE 0.40* 0.61 0.45  CALCIUM 8.1* 8.6* 8.7*   LFT Recent Labs    02/01/21 0618  PROT 6.1*  ALBUMIN 2.9*  AST 12*  ALT 10  ALKPHOS 47  BILITOT 0.4   PT/INR Recent Labs    01/31/21 2009 02/01/21 0535  LABPROT 20.8* 16.2*  INR 1.8* 1.3*   Medications: I have reviewed the patient's current medications. Prior to Admission:  Medications Prior to Admission  Medication Sig Dispense Refill Last Dose   acetaminophen (TYLENOL) 500 MG tablet Take 1,000 mg by mouth 4 (four) times daily as needed  for mild pain or moderate pain.   Past Month   alendronate (FOSAMAX) 70 MG tablet TAKE 1 TABLET BY MOUTH ONCE A WEEK ON AN EMPTY STOMACH ON SATURDAYS WITH A FULL GLASS OF WATER. (Patient taking differently: Take 70 mg by mouth once a week.) 12 tablet 0 01/24/2021   atorvastatin (LIPITOR) 40 MG tablet Take 40 mg by mouth daily at 6 PM.    01/25/2021   Blood Glucose Monitoring Suppl (ACCU-CHEK AVIVA PLUS) w/Device KIT 1 Device by Does not apply route daily. 1 kit 1    cholecalciferol (VITAMIN D3) 25 MCG (1000 UNIT) tablet Take 1,000 Units by mouth daily.   01/26/2021   diltiazem (TIAZAC) 360 MG 24 hr capsule Take 1 capsule (360 mg total) by mouth daily. 90 capsule 1 01/26/2021   donepezil (ARICEPT) 10 MG tablet Take 10 mg by mouth at bedtime.    01/25/2021   esomeprazole (NEXIUM) 20 MG capsule TAKE 1 CAPSULE(20 MG) BY MOUTH DAILY (Patient taking differently: Take 20 mg by mouth daily at 12 noon.) 30 capsule 11 01/26/2021   famotidine (PEPCID) 20 MG tablet Take 20 mg by mouth 2 (two) times daily.   01/26/2021   feeding supplement, ENSURE ENLIVE, (ENSURE ENLIVE) LIQD Take 237 mLs by mouth 2 (two) times daily between meals. (Patient taking differently: Take 237 mLs by mouth as needed (when pt remembers).)   01/26/2021   furosemide (LASIX) 20 MG tablet  Take 20 mg by mouth daily.   01/26/2021   gabapentin (NEURONTIN) 300 MG capsule Take 300 mg by mouth 2 (two) times daily.   01/26/2021   glucose blood (ONE TOUCH ULTRA TEST) test strip Use to check blood sugar 1 time per day 100 each 2    insulin lispro (HUMALOG) 100 UNIT/ML injection Inject 0-12 Units into the skin 3 (three) times daily before meals. Per Sliding Scale. If blood sugar is: 0-150: 0 units 151-200: 2 units 201-250: 4 units 251-300: 6 units 301-350: 8 units 351-400: 10 units 401-450: 12 units   01/26/2021   levothyroxine (SYNTHROID) 175 MCG tablet TAKE 1 TABLET(175 MCG) BY MOUTH DAILY BEFORE BREAKFAST (Patient taking differently: Take 150 mcg by mouth  daily before breakfast.) 30 tablet 0 01/25/2021   losartan (COZAAR) 100 MG tablet Take 1 tablet (100 mg total) by mouth daily. 90 tablet 1 01/26/2021   magnesium oxide (MAG-OX) 400 MG tablet Take 800 mg by mouth daily.    01/26/2021   metFORMIN (GLUCOPHAGE-XR) 500 MG 24 hr tablet TAKE 1 TABLET(500 MG) BY MOUTH DAILY WITH BREAKFAST (Patient taking differently: Take 500 mg by mouth daily with breakfast.) 90 tablet 0 01/26/2021   mirtazapine (REMERON) 30 MG tablet Take 1 tablet (30 mg total) by mouth at bedtime. 30 tablet 1 01/25/2021   multivitamin (RENA-VIT) TABS tablet Take 1 tablet by mouth daily.   01/26/2021   mycophenolate (CELLCEPT) 250 MG capsule Take 250 mg by mouth 2 (two) times daily.    01/26/2021   NONFORMULARY OR COMPOUNDED ITEM [facility]:  Pain Cream - Ketamine 5%, Baclofen 2%, Gabapentin 5%, Lidocaine 5%, Menthol 1%, apply 1-2 grams to affected area 3-4 times a day for pain. 100 each 3    nystatin (MYCOSTATIN) powder Apply 1 g topically 4 (four) times daily as needed. For yeast under breast   PRN   ondansetron (ZOFRAN ODT) 4 MG disintegrating tablet 38m ODT q4 hours prn nausea/vomiting (Patient taking differently: Take 4 mg by mouth every 4 (four) hours as needed for nausea or vomiting.) 15 tablet 0 PRN   ONETOUCH DELICA LANCETS 361YMISC Use to check blood sugar 1 time per day 100 each 2    Potassium Chloride ER 20 MEQ TBCR Take 1 tablet by mouth daily. NEEDS POTASSIUM RE-CHECK. 90 tablet 1 01/26/2021   predniSONE (DELTASONE) 5 MG tablet Take 5 mg by mouth daily with breakfast.    01/26/2021   psyllium (METAMUCIL) 58.6 % packet Take 1 packet by mouth daily.   01/25/2021   sertraline (ZOLOFT) 100 MG tablet Take 0.5 tablets (50 mg total) by mouth daily. (Patient taking differently: Take 100 mg by mouth daily.) 15 tablet 2 01/26/2021   Skin Protectants, Misc. (EUCERIN) cream Apply 1 application topically daily. To feet   01/26/2021   tacrolimus (PROGRAF) 1 MG capsule Take 2.5 mg by mouth  every 12 (twelve) hours.   01/26/2021 at 0900   traMADol (ULTRAM) 50 MG tablet Take 100 mg by mouth every 6 (six) hours as needed for pain.   01/23/2021   warfarin (COUMADIN) 5 MG tablet TAKE 1/2 TO 1 TABLET BY MOUTH ON MONDAY THROUGH FRIDAY; TUESDAY, WEDNESDAY, THURSDAY, SATAND SUNDAY-2.5MG (Patient taking differently: Take 6.5 mg by mouth daily.) 50 tablet 0 01/25/2021   calcitRIOL (ROCALTROL) 0.25 MCG capsule TAKE 1 CAPSULE(0.25 MCG) BY MOUTH DAILY. NO REFILLS WITHOUT APPOINTMENT (Patient not taking: No sig reported) 30 capsule 0 Not Taking   morphine (MSIR) 15 MG tablet Take 0.5 tablets (  7.5 mg total) by mouth every 6 (six) hours as needed for severe pain. (Patient not taking: No sig reported) 4 tablet 0 Not Taking   QUEtiapine (SEROQUEL) 25 MG tablet Take 1 tablet (25 mg total) by mouth at bedtime. (Patient not taking: No sig reported) 30 tablet 1 Not Taking   sucralfate (CARAFATE) 1 g tablet Take 1 tablet (1 g total) by mouth 4 (four) times daily for 30 days. (Patient not taking: Reported on 01/26/2021)   Not Taking   Scheduled:  donepezil  10 mg Oral QHS   fidaxomicin  200 mg Oral BID   hydrocortisone sod succinate (SOLU-CORTEF) inj  25 mg Intravenous BID   insulin aspart  0-5 Units Subcutaneous QHS   insulin aspart  0-9 Units Subcutaneous TID WC   levothyroxine  75 mcg Intravenous Daily   metoCLOPramide (REGLAN) injection  10 mg Intravenous Q6H   mirtazapine  30 mg Oral QHS   mycophenolate  250 mg Oral BID   tacrolimus  2.5 mg Oral Q12H   vancomycin (VANCOCIN) rectal ENEMA  500 mg Rectal Q6H   Continuous:  dextrose 5 % and 0.45 % NaCl with KCl 20 mEq/L 50 mL/hr at 02/01/21 1102   diltiazem (CARDIZEM) infusion 5 mg/hr (02/01/21 1131)   famotidine (PEPCID) IV 20 mg (02/01/21 1137)   heparin     promethazine (PHENERGAN) injection (IM or IVPB)     ZYY:[id number] **OR** acetaminophen, fentaNYL (SUBLIMAZE) injection, hydrALAZINE, promethazine (PHENERGAN) injection (IM or  IVPB)  Assessment/Plan: ) Acute colitis ?C. difficile with abdominal pain-Dificid now with vancomycin enemas. Patient seems to have improved with the Dificid and the vancomycin and will continue present care. 2) History of end-stage renal disease s/p renal transplant 2005. 3) IDDM/hypertension/hyperlipidemia. 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 5) GERD on IV Pepcid. 6) Depression/anxiety disorder. 7) [name].   LOS: 5 days   [name] 02/01/2021, 9:31 AM

## 2021-02-01 NOTE — Plan of Care (Signed)
  Problem: Nutrition: Goal: Adequate nutrition will be maintained Outcome: Progressing   Problem: Coping: Goal: Level of anxiety will decrease Outcome: Progressing   

## 2021-02-01 NOTE — Progress Notes (Signed)
ANTICOAGULATION CONSULT NOTE - follow up  Pharmacy Consult for Heparin  Indication: H/O DVT/PE  Allergies  Allergen Reactions   Oxycodone-Acetaminophen Shortness Of Breath and Nausea Only   Propoxyphene Nausea Only and Shortness Of Breath    States takes tylenol at home   Propoxyphene N-Acetaminophen Shortness Of Breath and Nausea Only   Sulfonamide Derivatives Shortness Of Breath and Nausea Only   Codeine Nausea Only   Hydrocodone-Acetaminophen    Hydromorphone    Other Other (See Comments)    No blood, Jehovaeh Witness    Oxycodone    Sulfamethoxazole    Tape     Redness**PAPER TAPE OK**   Gabapentin Anxiety    twitching   Latex Rash   Metoprolol Rash   Morphine And Related Rash    IV site on arm is red, patient reports this is improving.  NO shortness of breath reported.   Rosiglitazone Rash    Patient Measurements: Height: 5\' 4"  (162.6 cm) Weight: 95.6 kg (210 lb 12.2 oz) IBW/kg (Calculated) : 54.7 Heparin Dosing Weight: 76 kg  Vital Signs: Temp: 98.5 F (36.9 C) (08/28 2107) Temp Source: Oral (08/28 1206) BP: 149/90 (08/28 1206) Pulse Rate: 94 (08/28 1206)  Labs: Recent Labs    01/30/21 0526 01/30/21 1652 01/31/21 0713 01/31/21 1653 01/31/21 2009 02/01/21 0535 02/01/21 0618 02/01/21 2223  HGB 11.1*  --  12.3  --   --   --  11.8*  --   HCT 36.7  --  39.9  --   --   --  36.7  --   PLT 238  --  283  --   --   --  276  --   LABPROT 30.9*  --  31.2* 25.1* 20.8* 16.2*  --   --   INR 3.0*  --  3.0* 2.3* 1.8* 1.3*  --   --   HEPARINUNFRC  --   --   --   --   --   --   --  0.41  CREATININE 0.40*  --  0.61  --   --   --  0.45  --   TROPONINIHS  --  14  --   --   --   --   --   --      Estimated Creatinine Clearance: 83.9 mL/min (by C-G formula based on SCr of 0.45 mg/dL).   Medical History: Past Medical History:  Diagnosis Date   Anxiety    Candida esophagitis ([place]) 11/12/2014   CEREBROVASCULAR ACCIDENT, ACUTE 04/15/2010   CLOSTRIDIUM DIFFICILE  COLITIS, HX OF 08/21/2007   CONGESTIVE HEART FAILURE 08/21/2007   Current use of long term anticoagulation    Dr. Andree Elk, [facility]   CVA 04/17/2010   Depression    Dr. Andree Elk, [place], TYPE II 08/21/2007   DVT, HX OF 08/21/2007   GERD 08/21/2007   GOUT 08/21/2007   History of stroke with residual effects    HYPERLIPIDEMIA 08/21/2007   HYPERTENSION 08/21/2007   Dr. Andree Elk, [place], HX OF 08/22/2007   s/p renal transplant-Dr. 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 Pharmacy consulted to dose heparin until patient consistently tolerating PO. Patient did receive vitamin K 2 mg iv on 8/27.   HL 0.41 therapeutic on 1100 units/hr No bleeding noted  Goal of Therapy:  Heparin level 0.3-0.7 units/ml Monitor platelets by anticoagulation protocol: Yes   Plan:  Continue heparin drip at 1100 units/hr Confirmatory level in 6 hours Daily CBC   Dolly Rias RPh 02/01/2021, 11:10 PM

## 2021-02-01 NOTE — Progress Notes (Signed)
[facility]                                                                              Patient Demographics  Connie Ruiz, is a 60 y.o. female, DOB - 02-04-1961, OFB:[id number]  Admit date - 01/26/2021   Admitting Physician Donnamarie Poag [place] DO  Outpatient Primary MD for the patient is [name], [name], MD  Outpatient specialists:   LOS - 5  days   Medical records reviewed and are as summarized below:    Chief Complaint  Patient presents with   Abdominal Pain       Brief summary   Patient is 60 year old female, [name], ESRD, now status post renal transplant in 2005, hypothyroidism, gout, GERD, hyperlipidemia, diabetes mellitus type 2, SLE, DVT on warfarin presented from [facility] with abdominal pain and diarrhea for 5 days prior to admission.  Patient reported roughly 7 episodes in a day, watery, no hematochezia or melena.  Also reported generalized weakness, fatigue, no fevers or chills. C. difficile positive.  CT abdomen pelvis showed enteritis.  Assessment & Plan    Principal Problem: Non-fulminant severe acute C. difficile colitis with ileus, abdominal pain -Presented with profuse diarrhea, generalized weakness, abdominal pain - C. difficile PCR  Ag positive but toxin negative, high suspicion for C. difficile colitis, hence will treat -No significant improvement with oral vancomycin, changed to Dificid on 8/26 however has not gotten full twice daily dosings due to nausea and vomiting.  Patient also started on vancomycin enema on 8/26  -GI following.  Repeat CT abdomen on 8/25 did not show any bowel obstruction, perforation or toxic megacolon -Continue gentle hydration, placed on clear liquid diet.  Discussed with Dr. Collene Mares, currently no plans of EGD or colonoscopy  -Continue gentle hydration, scheduled Reglan, Phenergan  Active Problems: History of ESRD status post renal transplant, 2005 -Continue mycophenolate, tacrolimus, pharmacy  following -Creatinine stable -Patient is on prednisone maintenance at 5 mg daily, currently on stress dose steroids, will taper down to 25 mg twice daily (continue until patient able to take p.o)  Essential hypertension, sinus tachycardia -Likely due to dehydration from fluid loss with diarrhea, C. difficile colitis -Unable to take p.o. Cardizem-started on IV Cardizem drip. HR better controlled.     History of DVT/PE/CVA -Coumadin has been held for endoscopy, INR was 3.0 on 8/27, reversed -If no plans for endoscopy tomorrow, will place on IV heparin drip until patient is consistently tolerating p.o. Will hold Coumadin until then.  GERD -Continue IV Pepcid  Hypothyroidism -Continue IV Synthroid 75 MCG daily -TSH 0.5   Diabetes mellitus, type II, IDDM -Hemoglobin A1c 8.2 -CBGs controlled  Hyperlipidemia -Continue atorvastatin  Depression/anxiety -Continue sertraline  History of [name] -Declines any blood products if needed  Obesity Estimated body mass index is 36.18 kg/m as calculated from the following:   Height as of this encounter: 5\' 4"  (1.626 m).   Weight as of this encounter: 95.6 kg.  Code Status: Full CODE STATUS DVT Prophylaxis:    Warfarin currently on hold.   Level of Care: Level of care: Progressive Family Communication: Discussed all imaging results, lab  results, explained to the patient and daughter on 8/27 and today on the phone.  Disposition Plan:     Status is: Inpatient  Remains inpatient appropriate because:Inpatient level of care appropriate due to severity of illness  Dispo: The patient is from: SNF              Anticipated d/c is to: SNF              Patient currently is not medically stable to d/c.     Difficult to place patient No      Time Spent in minutes 25 minutes  Procedures:  None  Consultants:   Gastroenterology  Antimicrobials:   Anti-infectives (From admission, onward)    Start     Dose/Rate Route  Frequency Ordered Stop   01/30/21 2200  fidaxomicin (DIFICID) tablet 200 mg        200 mg Oral 2 times daily 01/30/21 1832 02/09/21 2159   01/30/21 2000  vancomycin (VANCOCIN) 500 mg in sodium chloride irrigation 0.9 % 100 mL ENEMA        500 mg Rectal Every 6 hours 01/30/21 1838 02/13/21 1559   01/27/21 2200  cefTRIAXone (ROCEPHIN) 2 g in sodium chloride 0.9 % 100 mL IVPB  Status:  Discontinued        2 g 200 mL/hr over 30 Minutes Intravenous Every 24 hours 01/27/21 0956 01/27/21 1136   01/27/21 1200  vancomycin (VANCOCIN) capsule 125 mg  Status:  Discontinued        125 mg Oral 4 times daily 01/27/21 1135 01/30/21 1832   01/27/21 1000  metroNIDAZOLE (FLAGYL) IVPB 500 mg  Status:  Discontinued        500 mg 100 mL/hr over 60 Minutes Intravenous Every 12 hours 01/27/21 0901 01/27/21 1135   01/27/21 0115  cefTRIAXone (ROCEPHIN) 2 g in sodium chloride 0.9 % 100 mL IVPB        2 g 200 mL/hr over 30 Minutes Intravenous  Once 01/27/21 0103 01/27/21 0411   01/27/21 0115  metroNIDAZOLE (FLAGYL) IVPB 500 mg  Status:  Discontinued        500 mg 100 mL/hr over 60 Minutes Intravenous Every 8 hours 01/27/21 0103 01/27/21 0901          Medications  Scheduled Meds:  donepezil  10 mg Oral QHS   fidaxomicin  200 mg Oral BID   hydrocortisone sod succinate (SOLU-CORTEF) inj  40 mg Intravenous BID   insulin aspart  0-5 Units Subcutaneous QHS   insulin aspart  0-9 Units Subcutaneous TID WC   levothyroxine  75 mcg Intravenous Daily   metoCLOPramide (REGLAN) injection  10 mg Intravenous Q6H   mirtazapine  30 mg Oral QHS   mycophenolate  250 mg Oral BID   tacrolimus  2.5 mg Oral Q12H   vancomycin (VANCOCIN) rectal ENEMA  500 mg Rectal Q6H   Continuous Infusions:  dextrose 5 % and 0.45 % NaCl with KCl 20 mEq/L 50 mL/hr at 02/01/21 1102   diltiazem (CARDIZEM) infusion 5 mg/hr (02/01/21 1131)   famotidine (PEPCID) IV 20 mg (02/01/21 1137)   promethazine (PHENERGAN) injection (IM or IVPB)     PRN  Meds:.acetaminophen **OR** acetaminophen, fentaNYL (SUBLIMAZE) injection, hydrALAZINE, promethazine (PHENERGAN) injection (IM or IVPB)      Subjective:   Connie Ruiz was seen and examined today.  Feels improving today, no active vomiting, abdominal pain somewhat better from yesterday.  No fevers.  Heart rate also stable on Cardizem drip at 5mg /hr.  Objective:   Vitals:   02/01/21 0500 02/01/21 0546 02/01/21 0600 02/01/21 1206  BP: (!) 146/81 (!) 146/81 (!) 159/101 (!) 149/90  Pulse: 86  (!) 105 94  Resp: 17  10 17   Temp:  98.2 F (36.8 C)  98.4 F (36.9 C)  TempSrc:  Oral  Oral  SpO2: 98%  99% 100%  Weight:  95.6 kg    Height:        Intake/Output Summary (Last 24 hours) at 02/01/2021 1216 Last data filed at 02/01/2021 0600 Gross per 24 hour  Intake 897.1 ml  Output --  Net 897.1 ml     Wt Readings from Last 3 Encounters:  02/01/21 95.6 kg  08/21/20 88.9 kg  09/11/19 78.9 kg   Physical Exam General: Alert and oriented x 3, NAD appears to be somewhat more comfortable than yesterday Cardiovascular: S1 S2 clear, RRR.  Trace pedal edema b/l Respiratory: Diminished breath sound at the bases Gastrointestinal: Soft, "sore diffusely" but improving, + BS Ext: no pedal edema bilaterally Psych: Normal affect  GU: rectal tube +    Data Reviewed:  I have personally reviewed following labs and imaging studies  Micro Results Recent Results (from the past 240 hour(s))  Resp Panel by RT-PCR (Flu A&B, Covid) Nasopharyngeal Swab     Status: None   Collection Time: 01/26/21  9:56 PM   Specimen: Nasopharyngeal Swab; Nasopharyngeal(NP) swabs in vial transport medium  Result Value Ref Range Status   SARS Coronavirus 2 by RT PCR NEGATIVE NEGATIVE Final    Comment: (NOTE) SARS-CoV-2 target nucleic acids are NOT DETECTED.  The SARS-CoV-2 RNA is generally detectable in upper respiratory specimens during the acute phase of infection. The lowest concentration of SARS-CoV-2 viral  copies this assay can detect is 138 copies/mL. A negative result does not preclude SARS-Cov-2 infection and should not be used as the sole basis for treatment or other patient management decisions. A negative result may occur with  improper specimen collection/handling, submission of specimen other than nasopharyngeal swab, presence of viral mutation(s) within the areas targeted by this assay, and inadequate number of viral copies(<138 copies/mL). A negative result must be combined with clinical observations, patient history, and epidemiological information. The expected result is Negative.  Fact Sheet for Patients:  EntrepreneurPulse.com.au  Fact Sheet for Healthcare Providers:  IncredibleEmployment.be  This test is no t yet approved or cleared by the [place] FDA and  has been authorized for detection and/or diagnosis of SARS-CoV-2 by FDA under an Emergency Use Authorization (EUA). This EUA will remain  in effect (meaning this test can be used) for the duration of the COVID-19 declaration under Section 564(b)(1) of the Act, 21 U.S.C.section 360bbb-3(b)(1), unless the authorization is terminated  or revoked sooner.       Influenza A by PCR NEGATIVE NEGATIVE Final   Influenza B by PCR NEGATIVE NEGATIVE Final    Comment: (NOTE) The Xpert Xpress SARS-CoV-2/FLU/RSV plus assay is intended as an aid in the diagnosis of influenza from Nasopharyngeal swab specimens and should not be used as a sole basis for treatment. Nasal washings and aspirates are unacceptable for Xpert Xpress SARS-CoV-2/FLU/RSV testing.  Fact Sheet for Patients: EntrepreneurPulse.com.au  Fact Sheet for Healthcare Providers: IncredibleEmployment.be  This test is not yet approved or cleared by the [place] FDA and has been authorized for detection and/or diagnosis of SARS-CoV-2 by FDA under an Emergency Use Authorization (EUA). This  EUA will remain in effect (meaning this test can be used) for the  duration of the COVID-19 declaration under Section 564(b)(1) of the Act, 21 U.S.C. section 360bbb-3(b)(1), unless the authorization is terminated or revoked.  Performed at [facility], [place] [address] [place], [place] [address]   Blood culture (routine x 2)     Status: None (Preliminary result)   Collection Time: 01/27/21 12:32 AM   Specimen: Left Antecubital; Blood  Result Value Ref Range Status   Specimen Description   Final    LEFT ANTECUBITAL Performed at [place] [address], [place], [place] [address]    Special Requests   Final    BOTTLES DRAWN AEROBIC AND ANAEROBIC Blood Culture adequate volume Performed at [place] [address], [place], [place] [address]    Culture   Final    NO GROWTH 4 DAYS Performed at [facility], [place] [address], [place], [place] [address]    Report Status PENDING  Incomplete  Blood culture (routine x 2)     Status: None (Preliminary result)   Collection Time: 01/27/21  3:21 AM   Specimen: Left Antecubital; Blood  Result Value Ref Range Status   Specimen Description   Final    LEFT ANTECUBITAL Performed at [place] [address], [place], [place] [address]    Special Requests   Final    BOTTLES DRAWN AEROBIC AND ANAEROBIC Blood Culture results may not be optimal due to an excessive volume of blood received in culture bottles Performed at [place] [address] [place], [place] [address]    Culture   Final    NO GROWTH 4 DAYS Performed at [facility], [place] [address], [place], [place] [address]    Report Status PENDING  Incomplete  Gastrointestinal Panel by PCR , Stool     Status: None   Collection Time: 01/27/21  6:33 AM   Specimen: Stool  Result Value Ref Range Status   Campylobacter species NOT DETECTED NOT DETECTED Final   Plesimonas  shigelloides NOT DETECTED NOT DETECTED Final   Salmonella species NOT DETECTED NOT DETECTED Final   Yersinia enterocolitica NOT DETECTED NOT DETECTED Final   Vibrio species NOT DETECTED NOT DETECTED Final   Vibrio cholerae NOT DETECTED NOT DETECTED Final   Enteroaggregative E coli (EAEC) NOT DETECTED NOT DETECTED Final   Enteropathogenic E coli (EPEC) NOT DETECTED NOT DETECTED Final   Enterotoxigenic E coli (ETEC) NOT DETECTED NOT DETECTED Final   Shiga like toxin producing E coli (STEC) NOT DETECTED NOT DETECTED Final   Shigella/Enteroinvasive E coli (EIEC) NOT DETECTED NOT DETECTED Final   Cryptosporidium NOT DETECTED NOT DETECTED Final   Cyclospora cayetanensis NOT DETECTED NOT DETECTED Final   Entamoeba histolytica NOT DETECTED NOT DETECTED Final   Giardia lamblia NOT DETECTED NOT DETECTED Final   Adenovirus F40/41 NOT DETECTED NOT DETECTED Final   Astrovirus NOT DETECTED NOT DETECTED Final   Norovirus GI/GII NOT DETECTED NOT DETECTED Final   Rotavirus A NOT DETECTED NOT DETECTED Final   Sapovirus (I, II, IV, and V) NOT DETECTED NOT DETECTED Final    Comment: Performed at [facility], [place], [address] [place], [place] [address]  C Difficile Quick Screen w PCR reflex     Status: Abnormal   Collection Time: 01/27/21  6:33 AM   Specimen: Stool  Result Value Ref Range Status   C Diff antigen POSITIVE (A) NEGATIVE Final   C Diff toxin NEGATIVE NEGATIVE Final   C Diff  interpretation Results are indeterminate. See PCR results.  Final    Comment: Performed at [facility], [place] [address] [place], [place] [address]  C. Diff by PCR, Reflexed     Status: Abnormal   Collection Time: 01/27/21  6:33 AM  Result Value Ref Range Status   Toxigenic C. Difficile by PCR POSITIVE (A) NEGATIVE Final    Comment: Positive for toxigenic C. difficile with little to no toxin production. Only treat if clinical presentation suggests symptomatic illness. Performed at [facility], [place] [address] [place], [place] [address]     Radiology Reports CT ABDOMEN PELVIS WO CONTRAST  Result Date: 01/29/2021 CLINICAL DATA:  Bowel obstruction suspected. Abdominal pain, acute, nonlocalized. End-stage renal disease with previous renal transplant. EXAM: CT ABDOMEN AND PELVIS WITHOUT CONTRAST TECHNIQUE: Multidetector CT imaging of the abdomen and pelvis was performed following the standard protocol without IV contrast. COMPARISON:  01/26/2021 FINDINGS: Lower chest: Elevation of the right hemidiaphragm with mild atelectasis at the right lung base, similar to the previous study. Extensive coronary artery calcification. Hepatobiliary: Previous cholecystectomy.  No focal liver lesion. Pancreas: Normal Spleen: Normal Adrenals/Urinary Tract: Adrenal glands are normal. Markedly atrophic native kidneys without significant finding. Small cysts as seen previously. Transplant kidney in the right iliac fossa without abnormal finding by CT. Bladder appears normal. Stomach/Bowel: Stomach and small intestine are normal. Diverticulosis of the colon but no evidence of diverticulitis. No bowel obstruction or focal bowel lesion. Vascular/Lymphatic: Aortic atherosclerosis. No aneurysm. IVC is normal. No retroperitoneal adenopathy. Reproductive: No pelvic mass. Other: No free fluid or air. Musculoskeletal: No significant skeletal finding. IMPRESSION: No bowel obstruction or acute bowel pathology. Diverticulosis without evidence of diverticulitis. Elevation of the right hemidiaphragm with atelectasis at the right lung base. Atrophic native kidneys. Grossly normal appearing transplant kidney in the right iliac fossa. Aortic atherosclerosis. Widespread vascular calcification consistent with chronic renal failure. Electronically Signed   By: Nelson Chimes M.D.   On: 01/29/2021 16:10   CT ABDOMEN PELVIS WO CONTRAST  Result Date: 01/26/2021 CLINICAL DATA:  Concern for bowel obstruction. EXAM: CT ABDOMEN AND PELVIS  WITHOUT CONTRAST TECHNIQUE: Multidetector CT imaging of the abdomen and pelvis was performed following the standard protocol without IV contrast. COMPARISON:  CT abdomen pelvis dated 08/21/2020. FINDINGS: Evaluation of this exam is limited in the absence of intravenous contrast. Lower chest: Minimal bibasilar atelectasis. The visualized lung bases are otherwise clear. There is advanced 3 vessel coronary vascular calcification. No intra-abdominal free air or free fluid. Hepatobiliary: The liver is unremarkable. No intrahepatic biliary dilatation. Cholecystectomy. No retained calcified stone noted in the central CBD. Pancreas: Unremarkable. No pancreatic ductal dilatation or surrounding inflammatory changes. Spleen: Normal in size without focal abnormality. Adrenals/Urinary Tract: The adrenal glands are unremarkable. Atrophic native kidneys. Bilateral renal parenchyma nodularity similar to prior CT may represent residual renal parenchyma. A mass is less likely but not excluded. The visualized ureters appear unremarkable. The urinary bladder is collapsed. There is a right lower quadrant renal transplant. There is no hydronephrosis or nephrolithiasis of the transplant kidney. No peritransplant fluid collection. Stomach/Bowel: There is diffuse colonic diverticulosis without active inflammatory changes. There is mild thickened appearance of the jejunal folds with engorgement of the associated mesentery. Clinical correlation is recommended to evaluate for possibility of enteritis. There is no bowel obstruction. The appendix is normal. Vascular/Lymphatic: Moderate aortoiliac atherosclerotic disease. The IVC is unremarkable. No portal venous gas. There is no adenopathy. Reproductive: The uterus is grossly unremarkable. No adnexal  masses. Other: Mild diffuse subcutaneous stranding.  No fluid collection. Musculoskeletal: Osteopenia with degenerative changes of the spine. Multiple old left rib fractures. No acute osseous  pathology. IMPRESSION: 1. Mild thickened appearance of the jejunal folds with engorgement of the associated mesentery. Clinical correlation is recommended to evaluate for possibility of enteritis. No bowel obstruction. Normal appendix. 2. Colonic diverticulosis. 3. Atrophic native kidneys with a right lower quadrant renal transplant. No hydronephrosis or nephrolithiasis. 4. Aortic Atherosclerosis (ICD10-I70.0). Electronically Signed   By: Anner Crete M.D.   On: 01/26/2021 20:23   DG Chest 2 View  Result Date: 01/26/2021 CLINICAL DATA:  Lower abdominal pain since Friday, tachycardia EXAM: CHEST - 2 VIEW COMPARISON:  08/28/2020 FINDINGS: Frontal and lateral views of the chest demonstrate an unremarkable cardiac silhouette. No acute airspace disease, effusion, or pneumothorax. No acute bony abnormalities. IMPRESSION: 1. No acute intrathoracic process. Electronically Signed   By: Randa Ngo M.D.   On: 01/26/2021 22:17   DG Abd 2 Views  Result Date: 01/29/2021 CLINICAL DATA:  Abdominal pain EXAM: ABDOMEN - 2 VIEW COMPARISON:  None. FINDINGS: There is mildly dilated of the cecum. Nondistended small bowel. No acute osseous abnormality. There are soft tissue calcifications consistent with subcutaneous calcification seen on prior CT. IMPRESSION: Mild dilation of the cecum. If there is concern for obstruction recommend CT. Electronically Signed   By: Maurine Simmering M.D.   On: 01/29/2021 09:25    Lab Data:  CBC: Recent Labs  Lab 01/26/21 1859 01/28/21 0519 01/29/21 0515 01/30/21 0526 01/31/21 0713 02/01/21 0618  WBC 12.4* 10.3 8.0 8.6 9.4 10.7*  NEUTROABS 8.3*  --   --   --   --   --   HGB 14.4 11.3* 12.3 11.1* 12.3 11.8*  HCT 46.6* 38.0 45.2 36.7 39.9 36.7  MCV 86.8 90.7 100.0 89.1 87.3 85.2  PLT 334 254 262 238 283 786   Basic Metabolic Panel: Recent Labs  Lab 01/28/21 0519 01/29/21 0515 01/30/21 0526 01/31/21 0713 02/01/21 0618  NA 140 141 139 140 140  K 3.7 3.6 3.3* 3.6 3.6  CL  115* 113* 112* 111 112*  CO2 21* 20* 22 19* 23  GLUCOSE 99 97 79 205* 197*  BUN 12 7 <5* 7 8  CREATININE 0.53 0.54 0.40* 0.61 0.45  CALCIUM 8.6* 8.3* 8.1* 8.6* 8.7*  MG  --  1.5* 1.1* 2.0  --    GFR: Estimated Creatinine Clearance: 83.9 mL/min (by C-G formula based on SCr of 0.45 mg/dL). Liver Function Tests: Recent Labs  Lab 01/26/21 1859 01/28/21 0519 02/01/21 0618  AST 15 15 12*  ALT 14 11 10   ALKPHOS 67 50 47  BILITOT 0.9 0.5 0.4  PROT 7.4 5.8* 6.1*  ALBUMIN 3.6 2.9* 2.9*   Recent Labs  Lab 01/26/21 1859 01/29/21 0515  LIPASE 27 26   No results for input(s): AMMONIA in the last 168 hours. Coagulation Profile: Recent Labs  Lab 01/30/21 0526 01/31/21 0713 01/31/21 1653 01/31/21 2009 02/01/21 0535  INR 3.0* 3.0* 2.3* 1.8* 1.3*   Cardiac Enzymes: No results for input(s): CKTOTAL, CKMB, CKMBINDEX, TROPONINI in the last 168 hours. BNP (last 3 results) No results for input(s): PROBNP in the last 8760 hours. HbA1C: No results for input(s): HGBA1C in the last 72 hours.  CBG: Recent Labs  Lab 01/31/21 0716 01/31/21 1129 01/31/21 1716 02/01/21 0749 02/01/21 1205  GLUCAP 158* 142* 179* 177* 137*   Lipid Profile: No results for input(s): CHOL, HDL, LDLCALC, TRIG, CHOLHDL, LDLDIRECT  in the last 72 hours. Thyroid Function Tests: No results for input(s): TSH, T4TOTAL, FREET4, T3FREE, THYROIDAB in the last 72 hours.  Anemia Panel: No results for input(s): VITAMINB12, FOLATE, FERRITIN, TIBC, IRON, RETICCTPCT in the last 72 hours. Urine analysis:    Component Value Date/Time   COLORURINE YELLOW 01/26/2021 2156   APPEARANCEUR CLEAR 01/26/2021 2156   LABSPEC >1.030 (H) 01/26/2021 2156   PHURINE 6.0 01/26/2021 2156   GLUCOSEU NEGATIVE 01/26/2021 2156   GLUCOSEU NEGATIVE 10/22/2013 1621   HGBUR NEGATIVE 01/26/2021 2156   BILIRUBINUR MODERATE (A) 01/26/2021 2156   KETONESUR 40 (A) 01/26/2021 2156   PROTEINUR TRACE (A) 01/26/2021 2156   UROBILINOGEN 1.0  03/27/2015 1534   NITRITE NEGATIVE 01/26/2021 2156   LEUKOCYTESUR NEGATIVE 01/26/2021 2156     Shey Yott M.D. [facility] 02/01/2021, 12:16 PM  Available via Epic secure chat 7am-7pm After 7 pm, please refer to night coverage provider listed on amion.

## 2021-02-01 NOTE — Progress Notes (Signed)
PT Cancellation Note  Patient Details Name: Connie Ruiz MRN: [id number] DOB: September 28, 1960   Cancelled Treatment:    Reason Eval/Treat Not Completed:  Attempted PT eval-pt about to have lunch. Will check back as schedule allows.    [facility]  Office: [phone] Pager: [phone]

## 2021-02-01 NOTE — Progress Notes (Signed)
ANTICOAGULATION CONSULT NOTE - Initial Consult  Pharmacy Consult for Heparin  Indication: H/O DVT/PE  Allergies  Allergen Reactions   Oxycodone-Acetaminophen Shortness Of Breath and Nausea Only   Propoxyphene Nausea Only and Shortness Of Breath    States takes tylenol at home   Propoxyphene N-Acetaminophen Shortness Of Breath and Nausea Only   Sulfonamide Derivatives Shortness Of Breath and Nausea Only   Codeine Nausea Only   Hydrocodone-Acetaminophen    Hydromorphone    Other Other (See Comments)    No blood, Jehovaeh Witness    Oxycodone    Sulfamethoxazole    Tape     Redness**PAPER TAPE OK**   Gabapentin Anxiety    twitching   Latex Rash   Metoprolol Rash   Morphine And Related Rash    IV site on arm is red, patient reports this is improving.  NO shortness of breath reported.   Rosiglitazone Rash    Patient Measurements: Height: 5\' 4"  (162.6 cm) Weight: 95.6 kg (210 lb 12.2 oz) IBW/kg (Calculated) : 54.7 Heparin Dosing Weight: 76 kg  Vital Signs: Temp: 98.4 F (36.9 C) (08/28 1206) Temp Source: Oral (08/28 1206) BP: 149/90 (08/28 1206) Pulse Rate: 94 (08/28 1206)  Labs: Recent Labs    01/30/21 0526 01/30/21 1652 01/31/21 0713 01/31/21 1653 01/31/21 2009 02/01/21 0535 02/01/21 0618  HGB 11.1*  --  12.3  --   --   --  11.8*  HCT 36.7  --  39.9  --   --   --  36.7  PLT 238  --  283  --   --   --  276  LABPROT 30.9*  --  31.2* 25.1* 20.8* 16.2*  --   INR 3.0*  --  3.0* 2.3* 1.8* 1.3*  --   CREATININE 0.40*  --  0.61  --   --   --  0.45  TROPONINIHS  --  14  --   --   --   --   --     Estimated Creatinine Clearance: 83.9 mL/min (by C-G formula based on SCr of 0.45 mg/dL).   Medical History: Past Medical History:  Diagnosis Date   Anxiety    Candida esophagitis ([place]) 11/12/2014   CEREBROVASCULAR ACCIDENT, ACUTE 04/15/2010   CLOSTRIDIUM DIFFICILE COLITIS, HX OF 08/21/2007   CONGESTIVE HEART FAILURE 08/21/2007   Current use of long term anticoagulation     Dr. Andree Elk, [facility]   CVA 04/17/2010   Depression    Dr. Andree Elk, [place], TYPE II 08/21/2007   DVT, HX OF 08/21/2007   GERD 08/21/2007   GOUT 08/21/2007   History of stroke with residual effects    HYPERLIPIDEMIA 08/21/2007   HYPERTENSION 08/21/2007   Dr. Andree Elk, [place], HX OF 08/22/2007   s/p renal transplant-Dr. Bonney Leitz   LUPUS 08/21/2007   OSTEOPOROSIS 08/21/2007   Rheumatol at [facility]   Pulmonary embolism ([place]) 07/16/2010   Renal failure    RENAL INSUFFICIENCY 08/21/2007   Right sided weakness    Steroid-induced hyperglycemia 11/09/2014   Tachycardia    THYROID NODULE, LEFT 04/10/2009    Medications:  Warfarin PTA dosing of 6.5 mg daily  Assessment: 60 y/o F [name] on warfarin PTA for a h/o DVT/PE. Pharmacy consulted to dose warfarin until patient consistently tolerating PO. Patient did receive vitamin K 2 mg iv on 8/27.   Goal of Therapy:  Heparin level 0.3-0.7 units/ml Monitor platelets by anticoagulation protocol: Yes   Plan:  Start heparin infusion at 1100 units/hr Check anti-Xa level in 6 hours and daily while on heparin Continue to monitor H&H and platelets  Ulice Dash D 02/01/2021,12:30 PM

## 2021-02-01 NOTE — Progress Notes (Signed)
Notified Dr. Tana Coast about pt's HR staying between 78s and 90s. Was ordered to keep the pt on Cardizem drip.

## 2021-02-01 NOTE — Evaluation (Signed)
Physical Therapy Evaluation Patient Details Name: Connie Ruiz MRN: [id number] DOB: [birth year] Today's Date: 02/01/2021   History of Present Illness  60 yo female admitted from SNF with enteritis. Hx of renal transplant 2005, gout, hypothyroiodism, DM, DVT, SLE  Clinical Impression  Bed level eval. Pt a bit hesitant but did allow me to assess UEs/LEs. Pt is globally weak but she was able to feed herself (hold cups-currently on liquid diet). Discussed prior level of function-pt reports hoyer lift is used for bed<>chair transfers at [facility]. Plan is to return to SNF once medically stable. Recommend OOB<>chair using lift with nursing as able. Will defer further evaluation/tx to SNF. Will sign off.     Follow Up Recommendations  (return to SNF)    Equipment Recommendations  None recommended by PT    Recommendations for Other Services       Precautions / Restrictions Precautions Precautions: Fall Precaution Comments: flexiseal Restrictions Weight Bearing Restrictions: No      Mobility  Bed Mobility               General bed mobility comments: NT    Transfers                    Ambulation/Gait                Stairs            Wheelchair Mobility    Modified Rankin (Stroke Patients Only)       Balance                                             Pertinent Vitals/Pain Pain Assessment: Faces Faces Pain Scale: Hurts even more Pain Location: stomach Pain Descriptors / Indicators: Aching Pain Intervention(s): Monitored during session    Home Living Family/patient expects to be discharged to:: Skilled nursing facility                      Prior Function Level of Independence: Needs assistance   Gait / Transfers Assistance Needed: hoyer lift for OOB<>chair. Pt reports she hasnt stood for at least a month. States she is still working with PT daily at SNF (unsure of accuracy)  ADL's / Homemaking Assistance Needed: has  assist for all ADLs        Hand Dominance        Extremity/Trunk Assessment   Upper Extremity Assessment Upper Extremity Assessment: Generalized weakness    Lower Extremity Assessment Lower Extremity Assessment: Generalized weakness (DF/PF at least 3/5, Hip flex 2/5, poor-fair quad set)       Communication   Communication: No difficulties  Cognition Arousal/Alertness: Awake/alert Behavior During Therapy: WFL for tasks assessed/performed Overall Cognitive Status: No family/caregiver present to determine baseline cognitive functioning                                        General Comments      Exercises     Assessment/Plan    PT Assessment All further PT needs can be met in the next venue of care (defer to SNF)  PT Problem List Decreased strength;Decreased mobility;Decreased range of motion;Decreased activity tolerance;Decreased balance;Pain       PT Treatment Interventions      PT  Goals (Current goals can be found in the Care Plan section)  Acute Rehab PT Goals Patient Stated Goal: feel better PT Goal Formulation: All assessment and education complete, DC therapy    Frequency     Barriers to discharge        Co-evaluation               AM-PAC PT "6 Clicks" Mobility  Outcome Measure Help needed turning from your back to your side while in a flat bed without using bedrails?: Total Help needed moving from lying on your back to sitting on the side of a flat bed without using bedrails?: Total Help needed moving to and from a bed to a chair (including a wheelchair)?: Total Help needed standing up from a chair using your arms (e.g., wheelchair or bedside chair)?: Total Help needed to walk in hospital room?: Total Help needed climbing 3-5 steps with a railing? : Total 6 Click Score: 6    End of Session   Activity Tolerance: Patient tolerated treatment well Patient left: in bed;with call bell/phone within reach;with bed alarm set         Time: 1435-1443 PT Time Calculation (min) (ACUTE ONLY): 8 min   Charges:   PT Evaluation $PT Eval Moderate Complexity: [place], PT Acute Rehabilitation  Office: [phone] Pager: [phone]

## 2021-02-02 ENCOUNTER — Other Ambulatory Visit (HOSPITAL_COMMUNITY): Payer: Self-pay

## 2021-02-02 DIAGNOSIS — Z940 Kidney transplant status: Secondary | ICD-10-CM

## 2021-02-02 DIAGNOSIS — I5032 Chronic diastolic (congestive) heart failure: Secondary | ICD-10-CM | POA: Diagnosis not present

## 2021-02-02 DIAGNOSIS — R110 Nausea: Secondary | ICD-10-CM

## 2021-02-02 DIAGNOSIS — R109 Unspecified abdominal pain: Secondary | ICD-10-CM | POA: Diagnosis not present

## 2021-02-02 DIAGNOSIS — E1122 Type 2 diabetes mellitus with diabetic chronic kidney disease: Secondary | ICD-10-CM

## 2021-02-02 DIAGNOSIS — I825Z9 Chronic embolism and thrombosis of unspecified deep veins of unspecified distal lower extremity: Secondary | ICD-10-CM

## 2021-02-02 DIAGNOSIS — M329 Systemic lupus erythematosus, unspecified: Secondary | ICD-10-CM

## 2021-02-02 DIAGNOSIS — K529 Noninfective gastroenteritis and colitis, unspecified: Secondary | ICD-10-CM | POA: Diagnosis not present

## 2021-02-02 DIAGNOSIS — N186 End stage renal disease: Secondary | ICD-10-CM

## 2021-02-02 DIAGNOSIS — R000 Tachycardia, unspecified: Secondary | ICD-10-CM

## 2021-02-02 DIAGNOSIS — R011 Cardiac murmur, unspecified: Secondary | ICD-10-CM

## 2021-02-02 DIAGNOSIS — A0472 Enterocolitis due to Clostridium difficile, not specified as recurrent: Secondary | ICD-10-CM | POA: Diagnosis not present

## 2021-02-02 DIAGNOSIS — Z992 Dependence on renal dialysis: Secondary | ICD-10-CM

## 2021-02-02 DIAGNOSIS — R197 Diarrhea, unspecified: Secondary | ICD-10-CM

## 2021-02-02 LAB — CBC
HCT: 39.6 % (ref 36.0–46.0)
Hemoglobin: 12.6 g/dL (ref 12.0–15.0)
MCH: 27.3 pg (ref 26.0–34.0)
MCHC: 31.8 g/dL (ref 30.0–36.0)
MCV: 85.9 fL (ref 80.0–100.0)
Platelets: 190 10*3/uL (ref 150–400)
RBC: 4.61 MIL/uL (ref 3.87–5.11)
RDW: 15.6 % — ABNORMAL HIGH (ref 11.5–15.5)
WBC: 10 10*3/uL (ref 4.0–10.5)
nRBC: 0 % (ref 0.0–0.2)

## 2021-02-02 LAB — HEPARIN LEVEL (UNFRACTIONATED)
Heparin Unfractionated: 0.84 IU/mL — ABNORMAL HIGH (ref 0.30–0.70)
Heparin Unfractionated: 0.86 IU/mL — ABNORMAL HIGH (ref 0.30–0.70)
Heparin Unfractionated: 0.34 IU/mL (ref 0.30–0.70)

## 2021-02-02 LAB — GLUCOSE, CAPILLARY
Glucose-Capillary: 127 mg/dL — ABNORMAL HIGH (ref 70–99)
Glucose-Capillary: 151 mg/dL — ABNORMAL HIGH (ref 70–99)

## 2021-02-02 LAB — PROTIME-INR
INR: 1.2 (ref 0.8–1.2)
Prothrombin Time: 15.7 seconds — ABNORMAL HIGH (ref 11.4–15.2)

## 2021-02-02 NOTE — Progress Notes (Signed)
[facility]                                                                              Patient Demographics  Connie Ruiz, is a 60 y.o. female, DOB - [birth year], ZHG:[id number]  Admit date - 01/26/2021   Admitting Physician Donnamarie Poag [place] DO  Outpatient Primary MD for the patient is [name], [name], MD  Outpatient specialists:   LOS - 6  days   Medical records reviewed and are as summarized below:    Chief Complaint  Patient presents with   Abdominal Pain       Brief summary   Patient is 60 year old female, [name], ESRD, now status post renal transplant in 2005, hypothyroidism, gout, GERD, hyperlipidemia, diabetes mellitus type 2, SLE, DVT on warfarin presented from [facility] with abdominal pain and diarrhea for 5 days prior to admission.  Patient reported roughly 7 episodes in a day, watery, no hematochezia or melena.  Also reported generalized weakness, fatigue, no fevers or chills. C. difficile positive.  CT abdomen pelvis showed enteritis.  Assessment & Plan    Principal Problem: Non-fulminant severe acute C. difficile colitis with ileus, abdominal pain -Presented with profuse diarrhea, generalized weakness, abdominal pain - C. difficile PCR Ag + but toxin negative.  High suspicion for C. difficile colitis, hence treat -No significant improvement with oral vancomycin. changed to [place] on 8/26, also started on vancomycin enema on 8/26  - Repeat CT abdomen on 8/25 did not show any bowel obstruction, perforation or toxic megacolon -Continue IV fluid hydration, pain control, antiemetics -GI following,.  ID also consulted, appreciate recommendations -May need to start TPN if no significant improvement and poor p.o. intake  Active Problems: History of ESRD status post renal transplant, 2005 -Continue mycophenolate, tacrolimus, pharmacy following -Creatinine stable -Patient is on prednisone maintenance at 5 mg daily, currently on  stress dose steroids, will taper down to 25 mg twice daily (continue until patient able to take p.o)  Essential hypertension, sinus tachycardia -Likely due to dehydration from fluid loss with diarrhea, C. difficile colitis -Unable to take p.o. Cardizem, continue IV Cardizem drip low-dose   History of DVT/PE/CVA -Coumadin was held for endoscopy, INR was reversed -Continue IV heparin drip for now, patient unable to tolerate p.o. meds  GERD -Continue IV Pepcid  Hypothyroidism -Continue IV Synthroid 75 MCG daily -TSH 0.5   Diabetes mellitus, type II, IDDM -Hemoglobin A1c 8.2 -CBGs controlled  Hyperlipidemia -Continue atorvastatin  Depression/anxiety -Continue sertraline  History of [name] -Declines any blood products if needed  Obesity Estimated body mass index is 37.23 kg/m as calculated from the following:   Height as of this encounter: 5\' 4"  (1.626 m).   Weight as of this encounter: 98.4 kg.  Code Status: Full CODE STATUS DVT Prophylaxis:    Warfarin currently on hold.   Level of Care: Level of care: Progressive Family Communication: Discussed all imaging results, lab results, explained to the patient and daughter on the phone today  Disposition Plan:     Status is: Inpatient  Remains inpatient appropriate because:Inpatient level of care appropriate due to severity of illness  Dispo: The patient is from: SNF              Anticipated d/c is to: SNF              Patient currently is not medically stable to d/c.     Difficult to place patient No      Time Spent in minutes 25 minutes  Procedures:  None  Consultants:   Gastroenterology  Antimicrobials:   Anti-infectives (From admission, onward)    Start     Dose/Rate Route Frequency Ordered Stop   01/30/21 2200  fidaxomicin (DIFICID) tablet 200 mg        200 mg Oral 2 times daily 01/30/21 1832 02/09/21 2159   01/30/21 2000  vancomycin (VANCOCIN) 500 mg in sodium chloride irrigation 0.9 %  100 mL ENEMA  Status:  Discontinued        500 mg Rectal Every 6 hours 01/30/21 1838 02/02/21 1152   01/27/21 2200  cefTRIAXone (ROCEPHIN) 2 g in sodium chloride 0.9 % 100 mL IVPB  Status:  Discontinued        2 g 200 mL/hr over 30 Minutes Intravenous Every 24 hours 01/27/21 0956 01/27/21 1136   01/27/21 1200  vancomycin (VANCOCIN) capsule 125 mg  Status:  Discontinued        125 mg Oral 4 times daily 01/27/21 1135 01/30/21 1832   01/27/21 1000  metroNIDAZOLE (FLAGYL) IVPB 500 mg  Status:  Discontinued        500 mg 100 mL/hr over 60 Minutes Intravenous Every 12 hours 01/27/21 0901 01/27/21 1135   01/27/21 0115  cefTRIAXone (ROCEPHIN) 2 g in sodium chloride 0.9 % 100 mL IVPB        2 g 200 mL/hr over 30 Minutes Intravenous  Once 01/27/21 0103 01/27/21 0411   01/27/21 0115  metroNIDAZOLE (FLAGYL) IVPB 500 mg  Status:  Discontinued        500 mg 100 mL/hr over 60 Minutes Intravenous Every 8 hours 01/27/21 0103 01/27/21 0901          Medications  Scheduled Meds:  donepezil  10 mg Oral QHS   fidaxomicin  200 mg Oral BID   hydrocortisone sod succinate (SOLU-CORTEF) inj  25 mg Intravenous BID   insulin aspart  0-5 Units Subcutaneous QHS   insulin aspart  0-9 Units Subcutaneous TID WC   levothyroxine  75 mcg Intravenous Daily   metoCLOPramide (REGLAN) injection  10 mg Intravenous Q6H   mirtazapine  30 mg Oral QHS   mycophenolate  250 mg Oral BID   tacrolimus  2.5 mg Oral Q12H   Continuous Infusions:  dextrose 5 % and 0.45 % NaCl with KCl 20 mEq/L 50 mL/hr at 02/02/21 0505   diltiazem (CARDIZEM) infusion 5 mg/hr (02/01/21 1131)   famotidine (PEPCID) IV 20 mg (02/02/21 1118)   heparin 750 Units/hr (02/02/21 1320)   promethazine (PHENERGAN) injection (IM or IVPB)     PRN Meds:.acetaminophen **OR** acetaminophen, fentaNYL (SUBLIMAZE) injection, hydrALAZINE, promethazine (PHENERGAN) injection (IM or IVPB)      Subjective:   Connie Ruiz was seen and examined today.  Yesterday  was better, today feels still nauseous, abdominal pain, diarrhea.  Heart rate controlled, on low-dose Cardizem drip. Objective:   Vitals:   02/01/21 2107 02/02/21 0450 02/02/21 0500 02/02/21 1211  BP:  137/87    Pulse:      Resp:      Temp: 98.5 F (36.9 C) 98.2 F (36.8 C)  98.4 F (36.9  C)  TempSrc:  Oral    SpO2:  97%    Weight:   98.4 kg   Height:        Intake/Output Summary (Last 24 hours) at 02/02/2021 1406 Last data filed at 02/02/2021 0600 Gross per 24 hour  Intake 1644.58 ml  Output 1250 ml  Net 394.58 ml     Wt Readings from Last 3 Encounters:  02/02/21 98.4 kg  08/21/20 88.9 kg  09/11/19 78.9 kg   Physical Exam General: Alert and oriented, uncomfortable Cardiovascular: S1 S2 clear, RRR.  Respiratory: Diminished breath sound at bases Gastrointestinal: Soft, TTP diffusely, nondistended, NBS Ext: no pedal edema bilaterally Psych: uncomfortable          GU: Rectal tube +  Data Reviewed:  I have personally reviewed following labs and imaging studies  Micro Results Recent Results (from the past 240 hour(s))  Resp Panel by RT-PCR (Flu A&B, Covid) Nasopharyngeal Swab     Status: None   Collection Time: 01/26/21  9:56 PM   Specimen: Nasopharyngeal Swab; Nasopharyngeal(NP) swabs in vial transport medium  Result Value Ref Range Status   SARS Coronavirus 2 by RT PCR NEGATIVE NEGATIVE Final    Comment: (NOTE) SARS-CoV-2 target nucleic acids are NOT DETECTED.  The SARS-CoV-2 RNA is generally detectable in upper respiratory specimens during the acute phase of infection. The lowest concentration of SARS-CoV-2 viral copies this assay can detect is 138 copies/mL. A negative result does not preclude SARS-Cov-2 infection and should not be used as the sole basis for treatment or other patient management decisions. A negative result may occur with  improper specimen collection/handling, submission of specimen other than nasopharyngeal swab, presence of viral  mutation(s) within the areas targeted by this assay, and inadequate number of viral copies(<138 copies/mL). A negative result must be combined with clinical observations, patient history, and epidemiological information. The expected result is Negative.  Fact Sheet for Patients:  EntrepreneurPulse.com.au  Fact Sheet for Healthcare Providers:  IncredibleEmployment.be  This test is no t yet approved or cleared by the [place] FDA and  has been authorized for detection and/or diagnosis of SARS-CoV-2 by FDA under an Emergency Use Authorization (EUA). This EUA will remain  in effect (meaning this test can be used) for the duration of the COVID-19 declaration under Section 564(b)(1) of the Act, 21 U.S.C.section 360bbb-3(b)(1), unless the authorization is terminated  or revoked sooner.       Influenza A by PCR NEGATIVE NEGATIVE Final   Influenza B by PCR NEGATIVE NEGATIVE Final    Comment: (NOTE) The Xpert Xpress SARS-CoV-2/FLU/RSV plus assay is intended as an aid in the diagnosis of influenza from Nasopharyngeal swab specimens and should not be used as a sole basis for treatment. Nasal washings and aspirates are unacceptable for Xpert Xpress SARS-CoV-2/FLU/RSV testing.  Fact Sheet for Patients: EntrepreneurPulse.com.au  Fact Sheet for Healthcare Providers: IncredibleEmployment.be  This test is not yet approved or cleared by the [place] FDA and has been authorized for detection and/or diagnosis of SARS-CoV-2 by FDA under an Emergency Use Authorization (EUA). This EUA will remain in effect (meaning this test can be used) for the duration of the COVID-19 declaration under Section 564(b)(1) of the Act, 21 U.S.C. section 360bbb-3(b)(1), unless the authorization is terminated or revoked.  Performed at [facility], [place] [address], [place], [place] [address]   Blood culture (routine  x 2)     Status: None   Collection Time: 01/27/21 12:32 AM   Specimen:  Left Antecubital; Blood  Result Value Ref Range Status   Specimen Description   Final    LEFT ANTECUBITAL Performed at [facility] [address], [place], [place] [address]    Special Requests   Final    BOTTLES DRAWN AEROBIC AND ANAEROBIC Blood Culture adequate volume Performed at [facility] [address] [place], [place] [address]    Culture   Final    NO GROWTH 5 DAYS Performed at [facility], [place] [address], [place], [place] [address]    Report Status 02/01/2021 FINAL  Final  Blood culture (routine x 2)     Status: None   Collection Time: 01/27/21  3:21 AM   Specimen: Left Antecubital; Blood  Result Value Ref Range Status   Specimen Description   Final    LEFT ANTECUBITAL Performed at [facility] [address], [place], [place] [address]    Special Requests   Final    BOTTLES DRAWN AEROBIC AND ANAEROBIC Blood Culture results may not be optimal due to an excessive volume of blood received in culture bottles Performed at [facility] [address], [place], [place] [address]    Culture   Final    NO GROWTH 5 DAYS Performed at [facility], [place] [address], [place], [place] [address]    Report Status 02/01/2021 FINAL  Final  Gastrointestinal Panel by PCR , Stool     Status: None   Collection Time: 01/27/21  6:33 AM   Specimen: Stool  Result Value Ref Range Status   Campylobacter species NOT DETECTED NOT DETECTED Final   Plesimonas shigelloides NOT DETECTED NOT DETECTED Final   Salmonella species NOT DETECTED NOT DETECTED Final   Yersinia enterocolitica NOT DETECTED NOT DETECTED Final   Vibrio species NOT DETECTED NOT DETECTED Final   Vibrio cholerae NOT DETECTED NOT DETECTED Final   Enteroaggregative E coli (EAEC) NOT DETECTED NOT DETECTED Final   Enteropathogenic E coli (EPEC) NOT DETECTED NOT  DETECTED Final   Enterotoxigenic E coli (ETEC) NOT DETECTED NOT DETECTED Final   Shiga like toxin producing E coli (STEC) NOT DETECTED NOT DETECTED Final   Shigella/Enteroinvasive E coli (EIEC) NOT DETECTED NOT DETECTED Final   Cryptosporidium NOT DETECTED NOT DETECTED Final   Cyclospora cayetanensis NOT DETECTED NOT DETECTED Final   Entamoeba histolytica NOT DETECTED NOT DETECTED Final   Giardia lamblia NOT DETECTED NOT DETECTED Final   Adenovirus F40/41 NOT DETECTED NOT DETECTED Final   Astrovirus NOT DETECTED NOT DETECTED Final   Norovirus GI/GII NOT DETECTED NOT DETECTED Final   Rotavirus A NOT DETECTED NOT DETECTED Final   Sapovirus (I, II, IV, and V) NOT DETECTED NOT DETECTED Final    Comment: Performed at [facility], [place] [place], [place] [address]  C Difficile Quick Screen w PCR reflex     Status: Abnormal   Collection Time: 01/27/21  6:33 AM   Specimen: Stool  Result Value Ref Range Status   C Diff antigen POSITIVE (A) NEGATIVE Final   C Diff toxin NEGATIVE NEGATIVE Final   C Diff interpretation Results are indeterminate. See PCR results.  Final    Comment: Performed at [facility], [place] [address] [place], [place] [address]  C. Diff by PCR, Reflexed     Status: Abnormal   Collection Time: 01/27/21  6:33 AM  Result Value Ref Range Status   Toxigenic C. Difficile by PCR POSITIVE (A)  NEGATIVE Final    Comment: Positive for toxigenic C. difficile with little to no toxin production. Only treat if clinical presentation suggests symptomatic illness. Performed at [facility], [place] [address], [place], [place] [address]   Culture, blood (routine x 2)     Status: None (Preliminary result)   Collection Time: 01/30/21  7:11 PM   Specimen: BLOOD  Result Value Ref Range Status   Specimen Description   Final    BLOOD BLOOD LEFT HAND Performed at [facility] [address] [place], [place] [address]    Special  Requests   Final    BOTTLES DRAWN AEROBIC ONLY Blood Culture adequate volume Performed at [facility] [address] [place], [place] [address]    Culture   Final    NO GROWTH 2 DAYS Performed at [facility] [address], [place], [place] [address]    Report Status PENDING  Incomplete  Culture, blood (routine x 2)     Status: None (Preliminary result)   Collection Time: 01/30/21  7:11 PM   Specimen: BLOOD  Result Value Ref Range Status   Specimen Description   Final    BLOOD LEFT WRIST Performed at [facility] [address], [place], [place] [address]    Special Requests   Final    BOTTLES DRAWN AEROBIC ONLY Blood Culture adequate volume Performed at [facility] [address], [place], [place] [address]    Culture   Final    NO GROWTH 2 DAYS Performed at [facility] [address], [place],  [address]    Report Status PENDING  Incomplete    Radiology Reports CT ABDOMEN PELVIS WO CONTRAST  Result Date: 01/29/2021 CLINICAL DATA:  Bowel obstruction suspected. Abdominal pain, acute, nonlocalized. End-stage renal disease with previous renal transplant. EXAM: CT ABDOMEN AND PELVIS WITHOUT CONTRAST TECHNIQUE: Multidetector CT imaging of the abdomen and pelvis was performed following the standard protocol without IV contrast. COMPARISON:  01/26/2021 FINDINGS: Lower chest: Elevation of the right hemidiaphragm with mild atelectasis at the right lung base, similar to the previous study. Extensive coronary artery calcification. Hepatobiliary: Previous cholecystectomy.  No focal liver lesion. Pancreas: Normal Spleen: Normal Adrenals/Urinary Tract: Adrenal glands are normal. Markedly atrophic native kidneys without significant finding. Small cysts as seen previously. Transplant kidney in the right iliac fossa without abnormal finding by CT. Bladder appears normal. Stomach/Bowel: Stomach and small intestine are  normal. Diverticulosis of the colon but no evidence of diverticulitis. No bowel obstruction or focal bowel lesion. Vascular/Lymphatic: Aortic atherosclerosis. No aneurysm. IVC is normal. No retroperitoneal adenopathy. Reproductive: No pelvic mass. Other: No free fluid or air. Musculoskeletal: No significant skeletal finding. IMPRESSION: No bowel obstruction or acute bowel pathology. Diverticulosis without evidence of diverticulitis. Elevation of the right hemidiaphragm with atelectasis at the right lung base. Atrophic native kidneys. Grossly normal appearing transplant kidney in the right iliac fossa. Aortic atherosclerosis. Widespread vascular calcification consistent with chronic renal failure. Electronically Signed   By: Nelson Chimes M.D.   On: 01/29/2021 16:10   CT ABDOMEN PELVIS WO CONTRAST  Result Date: 01/26/2021 CLINICAL DATA:  Concern for bowel obstruction. EXAM: CT ABDOMEN AND PELVIS WITHOUT CONTRAST TECHNIQUE: Multidetector CT imaging of the abdomen and pelvis was performed following the standard protocol without IV contrast. COMPARISON:  CT abdomen pelvis dated 08/21/2020. FINDINGS: Evaluation of this exam is limited in the absence of intravenous contrast. Lower chest: Minimal bibasilar atelectasis.  The visualized lung bases are otherwise clear. There is advanced 3 vessel coronary vascular calcification. No intra-abdominal free air or free fluid. Hepatobiliary: The liver is unremarkable. No intrahepatic biliary dilatation. Cholecystectomy. No retained calcified stone noted in the central CBD. Pancreas: Unremarkable. No pancreatic ductal dilatation or surrounding inflammatory changes. Spleen: Normal in size without focal abnormality. Adrenals/Urinary Tract: The adrenal glands are unremarkable. Atrophic native kidneys. Bilateral renal parenchyma nodularity similar to prior CT may represent residual renal parenchyma. A mass is less likely but not excluded. The visualized ureters appear unremarkable. The  urinary bladder is collapsed. There is a right lower quadrant renal transplant. There is no hydronephrosis or nephrolithiasis of the transplant kidney. No peritransplant fluid collection. Stomach/Bowel: There is diffuse colonic diverticulosis without active inflammatory changes. There is mild thickened appearance of the jejunal folds with engorgement of the associated mesentery. Clinical correlation is recommended to evaluate for possibility of enteritis. There is no bowel obstruction. The appendix is normal. Vascular/Lymphatic: Moderate aortoiliac atherosclerotic disease. The IVC is unremarkable. No portal venous gas. There is no adenopathy. Reproductive: The uterus is grossly unremarkable. No adnexal masses. Other: Mild diffuse subcutaneous stranding.  No fluid collection. Musculoskeletal: Osteopenia with degenerative changes of the spine. Multiple old left rib fractures. No acute osseous pathology. IMPRESSION: 1. Mild thickened appearance of the jejunal folds with engorgement of the associated mesentery. Clinical correlation is recommended to evaluate for possibility of enteritis. No bowel obstruction. Normal appendix. 2. Colonic diverticulosis. 3. Atrophic native kidneys with a right lower quadrant renal transplant. No hydronephrosis or nephrolithiasis. 4. Aortic Atherosclerosis (ICD10-I70.0). Electronically Signed   By: Anner Crete M.D.   On: 01/26/2021 20:23   DG Chest 2 View  Result Date: 01/26/2021 CLINICAL DATA:  Lower abdominal pain since Friday, tachycardia EXAM: CHEST - 2 VIEW COMPARISON:  08/28/2020 FINDINGS: Frontal and lateral views of the chest demonstrate an unremarkable cardiac silhouette. No acute airspace disease, effusion, or pneumothorax. No acute bony abnormalities. IMPRESSION: 1. No acute intrathoracic process. Electronically Signed   By: Randa Ngo M.D.   On: 01/26/2021 22:17   DG Abd 2 Views  Result Date: 01/29/2021 CLINICAL DATA:  Abdominal pain EXAM: ABDOMEN - 2 VIEW  COMPARISON:  None. FINDINGS: There is mildly dilated of the cecum. Nondistended small bowel. No acute osseous abnormality. There are soft tissue calcifications consistent with subcutaneous calcification seen on prior CT. IMPRESSION: Mild dilation of the cecum. If there is concern for obstruction recommend CT. Electronically Signed   By: Maurine Simmering M.D.   On: 01/29/2021 09:25    Lab Data:  CBC: Recent Labs  Lab 01/26/21 1859 01/28/21 0519 01/29/21 0515 01/30/21 0526 01/31/21 0713 02/01/21 0618 02/02/21 0356  WBC 12.4*   < > 8.0 8.6 9.4 10.7* 10.0  NEUTROABS 8.3*  --   --   --   --   --   --   HGB 14.4   < > 12.3 11.1* 12.3 11.8* 12.6  HCT 46.6*   < > 45.2 36.7 39.9 36.7 39.6  MCV 86.8   < > 100.0 89.1 87.3 85.2 85.9  PLT 334   < > 262 238 283 276 190   < > = values in this interval not displayed.   Basic Metabolic Panel: Recent Labs  Lab 01/28/21 0519 01/29/21 0515 01/30/21 0526 01/31/21 0713 02/01/21 0618  NA 140 141 139 140 140  K 3.7 3.6 3.3* 3.6 3.6  CL 115* 113* 112* 111 112*  CO2 21* 20* 22 19* 23  GLUCOSE 99 97 79 205* 197*  BUN 12 7 <5* 7 8  CREATININE 0.53 0.54 0.40* 0.61 0.45  CALCIUM 8.6* 8.3* 8.1* 8.6* 8.7*  MG  --  1.5* 1.1* 2.0  --    GFR: Estimated Creatinine Clearance: 85.2 mL/min (by C-G formula based on SCr of 0.45 mg/dL). Liver Function Tests: Recent Labs  Lab 01/26/21 1859 01/28/21 0519 02/01/21 0618  AST 15 15 12*  ALT 14 11 10   ALKPHOS 67 50 47  BILITOT 0.9 0.5 0.4  PROT 7.4 5.8* 6.1*  ALBUMIN 3.6 2.9* 2.9*   Recent Labs  Lab 01/26/21 1859 01/29/21 0515  LIPASE 27 26   No results for input(s): AMMONIA in the last 168 hours. Coagulation Profile: Recent Labs  Lab 01/31/21 0713 01/31/21 1653 01/31/21 2009 02/01/21 0535 02/02/21 0356  INR 3.0* 2.3* 1.8* 1.3* 1.2   Cardiac Enzymes: No results for input(s): CKTOTAL, CKMB, CKMBINDEX, TROPONINI in the last 168 hours. BNP (last 3 results) No results for input(s): PROBNP in the  last 8760 hours. HbA1C: No results for input(s): HGBA1C in the last 72 hours.  CBG: Recent Labs  Lab 01/31/21 1716 02/01/21 0749 02/01/21 1205 02/01/21 1610 02/02/21 1112  GLUCAP 179* 177* 137* 180* 127*   Lipid Profile: No results for input(s): CHOL, HDL, LDLCALC, TRIG, CHOLHDL, LDLDIRECT in the last 72 hours. Thyroid Function Tests: No results for input(s): TSH, T4TOTAL, FREET4, T3FREE, THYROIDAB in the last 72 hours.  Anemia Panel: No results for input(s): VITAMINB12, FOLATE, FERRITIN, TIBC, IRON, RETICCTPCT in the last 72 hours. Urine analysis:    Component Value Date/Time   COLORURINE YELLOW 01/26/2021 2156   APPEARANCEUR CLEAR 01/26/2021 2156   LABSPEC >1.030 (H) 01/26/2021 2156   PHURINE 6.0 01/26/2021 2156   GLUCOSEU NEGATIVE 01/26/2021 2156   GLUCOSEU NEGATIVE 10/22/2013 1621   HGBUR NEGATIVE 01/26/2021 2156   BILIRUBINUR MODERATE (A) 01/26/2021 2156   KETONESUR 40 (A) 01/26/2021 2156   PROTEINUR TRACE (A) 01/26/2021 2156   UROBILINOGEN 1.0 03/27/2015 1534   NITRITE NEGATIVE 01/26/2021 2156   LEUKOCYTESUR NEGATIVE 01/26/2021 2156     Doreatha Offer M.D. [facility] 02/02/2021, 2:06 PM  Available via Epic secure chat 7am-7pm After 7 pm, please refer to night coverage provider listed on amion.

## 2021-02-02 NOTE — Progress Notes (Signed)
ANTICOAGULATION CONSULT NOTE - follow up  Pharmacy Consult for Heparin  Indication: H/O DVT/PE  Allergies  Allergen Reactions   Oxycodone-Acetaminophen Shortness Of Breath and Nausea Only   Propoxyphene Nausea Only and Shortness Of Breath    States takes tylenol at home   Propoxyphene N-Acetaminophen Shortness Of Breath and Nausea Only   Sulfonamide Derivatives Shortness Of Breath and Nausea Only   Codeine Nausea Only   Hydrocodone-Acetaminophen    Hydromorphone    Other Other (See Comments)    No blood, Jehovaeh Witness    Oxycodone    Sulfamethoxazole    Tape     Redness**PAPER TAPE OK**   Gabapentin Anxiety    twitching   Latex Rash   Metoprolol Rash   Morphine And Related Rash    IV site on arm is red, patient reports this is improving.  NO shortness of breath reported.   Rosiglitazone Rash    Patient Measurements: Height: 5\' 4"  (162.6 cm) Weight: 95.6 kg (210 lb 12.2 oz) IBW/kg (Calculated) : 54.7 Heparin Dosing Weight: 76 kg  Vital Signs: Temp: 98.5 F (36.9 C) (08/28 2107)  Labs: Recent Labs    01/30/21 0526 01/30/21 1652 01/31/21 0713 01/31/21 1653 01/31/21 2009 02/01/21 0535 02/01/21 0618 02/01/21 2223 02/02/21 0356  HGB 11.1*  --  12.3  --   --   --  11.8*  --  12.6  HCT 36.7  --  39.9  --   --   --  36.7  --  39.6  PLT 238  --  283  --   --   --  276  --  190  LABPROT 30.9*  --  31.2* 25.1* 20.8* 16.2*  --   --   --   INR 3.0*  --  3.0* 2.3* 1.8* 1.3*  --   --   --   HEPARINUNFRC  --   --   --   --   --   --   --  0.41 0.84*  CREATININE 0.40*  --  0.61  --   --   --  0.45  --   --   TROPONINIHS  --  14  --   --   --   --   --   --   --      Estimated Creatinine Clearance: 83.9 mL/min (by C-G formula based on SCr of 0.45 mg/dL).   Medical History: Past Medical History:  Diagnosis Date   Anxiety    Candida esophagitis ([place]) 11/12/2014   CEREBROVASCULAR ACCIDENT, ACUTE 04/15/2010   CLOSTRIDIUM DIFFICILE COLITIS, HX OF 08/21/2007    CONGESTIVE HEART FAILURE 08/21/2007   Current use of long term anticoagulation    Dr. Andree Elk, [facility]   CVA 04/17/2010   Depression    Dr. Andree Elk, [place], TYPE II 08/21/2007   DVT, HX OF 08/21/2007   GERD 08/21/2007   GOUT 08/21/2007   History of stroke with residual effects    HYPERLIPIDEMIA 08/21/2007   HYPERTENSION 08/21/2007   Dr. Andree Elk, [place], HX OF 08/22/2007   s/p renal transplant-Dr. Bonney Leitz   LUPUS 08/21/2007   OSTEOPOROSIS 08/21/2007   [facility]   Pulmonary embolism ([place]) 07/16/2010   Renal failure    RENAL INSUFFICIENCY 08/21/2007   Right sided weakness    Steroid-induced hyperglycemia 11/09/2014   Tachycardia    THYROID NODULE, LEFT 04/10/2009    Medications:  Warfarin PTA dosing of 6.5 mg daily  Assessment: 60 y/o F [name] on warfarin PTA for a h/o DVT/PE. Pharmacy consulted to dose heparin until patient consistently tolerating PO. Patient did receive vitamin K 2 mg iv on 8/27.   02/02/2021 HL 0.84 supra-therapeutic on 1100 units/hr CBC WNL No bleeding per RN  Goal of Therapy:  Heparin level 0.3-0.7 units/ml Monitor platelets by anticoagulation protocol: Yes   Plan:  Decrease heparin to 900 units/hr Confirmatory level in 6 hours Daily CBC   Dolly Rias RPh 02/02/2021, 4:22 AM

## 2021-02-02 NOTE — Progress Notes (Signed)
ANTICOAGULATION CONSULT NOTE - follow up  Pharmacy Consult for Heparin  Indication: H/O DVT/PE  Patient Measurements: Height: 5\' 4"  (162.6 cm) Weight: 98.4 kg (216 lb 14.4 oz) IBW/kg (Calculated) : 54.7 Heparin Dosing Weight: 76 kg  Vital Signs: Temp: 98.4 F (36.9 C) (08/29 1211) Temp Source: Oral (08/29 0450) BP: 137/87 (08/29 0450)  Labs: Recent Labs     0000 01/30/21 1652 01/31/21 0713 01/31/21 1653 01/31/21 2009 02/01/21 0535 02/01/21 0618 02/01/21 2223 02/02/21 0356 02/02/21 1033  HGB   < >  --  12.3  --   --   --  11.8*  --  12.6  --   HCT  --   --  39.9  --   --   --  36.7  --  39.6  --   PLT  --   --  283  --   --   --  276  --  190  --   LABPROT  --   --  31.2*   < > 20.8* 16.2*  --   --  15.7*  --   INR  --   --  3.0*   < > 1.8* 1.3*  --   --  1.2  --   HEPARINUNFRC  --   --   --   --   --   --   --  0.41 0.84* 0.86*  CREATININE  --   --  0.61  --   --   --  0.45  --   --   --   TROPONINIHS  --  14  --   --   --   --   --   --   --   --    < > = values in this interval not displayed.   Estimated Creatinine Clearance: 85.2 mL/min (by C-G formula based on SCr of 0.45 mg/dL).  Medical History: Past Medical History:  Diagnosis Date   Anxiety    Candida esophagitis ([place]) 11/12/2014   CEREBROVASCULAR ACCIDENT, ACUTE 04/15/2010   CLOSTRIDIUM DIFFICILE COLITIS, HX OF 08/21/2007   CONGESTIVE HEART FAILURE 08/21/2007   Current use of long term anticoagulation    Dr. Andree Elk, [facility]   CVA 04/17/2010   Depression    Dr. Andree Elk, [place], TYPE II 08/21/2007   DVT, HX OF 08/21/2007   GERD 08/21/2007   GOUT 08/21/2007   History of stroke with residual effects    HYPERLIPIDEMIA 08/21/2007   HYPERTENSION 08/21/2007   Dr. Andree Elk, [place], HX OF 08/22/2007   s/p renal transplant-Dr. Bonney Leitz   LUPUS 08/21/2007   OSTEOPOROSIS 08/21/2007   [facility]   Pulmonary embolism ([place]) 07/16/2010   Renal failure    RENAL  INSUFFICIENCY 08/21/2007   Right sided weakness    Steroid-induced hyperglycemia 11/09/2014   Tachycardia    THYROID NODULE, LEFT 04/10/2009    Medications:  Warfarin PTA dosing of 6.5 mg daily  Assessment: 60 y/o F [name] on warfarin PTA for a h/o DVT/PE. Pharmacy consulted to dose heparin until patient consistently tolerating PO. Patient did receive vitamin K 2 mg iv on 8/27.   02/02/2021 0400 HL 0.84 supra-therapeutic on 1100 units/hr CBC WNL, No bleeding per RN Heparin infusing into L antecubital, Heparin level drawn from L hand Unable to use RUE for venipuncture, etc - has fistula 1030 HL continues elevated at 0.86 units/ml  Goal of Therapy:  Heparin level 0.3-0.7 units/ml Monitor platelets  by anticoagulation protocol: Yes   Plan:  Decrease heparin to 750 units/hr Confirmatory level in 8 hours Daily CBC  [name] PharmD [facility] [phone] 02/02/2021, 12:32 PM

## 2021-02-02 NOTE — TOC Benefit Eligibility Note (Signed)
Patient Teacher, English as a foreign language completed.    The patient is currently admitted and upon discharge could be taking Dificid 200 mg tab.  The current 30 day co-pay is, $0.00.   The patient is insured through [employer] Dual Complete Medicare Part D/ [place] Medicaid     Lyndel Safe, [place] Patient Advocate Specialist [place] Team Direct Number: [phone]  Fax: [phone]

## 2021-02-02 NOTE — TOC Initial Note (Signed)
Transition of Care [facility]) - Initial/Assessment Note    Patient Details  Name: Connie Ruiz MRN: [id number] Date of Birth: 04/06/1961  [facility]) CM/SW Contact:    Ross Ludwig, LCSW Phone Number: 02/02/2021, 2:59 PM  Clinical Narrative:                  Patient is a 61 year old female who has been at [facility] for about 2 years.  Patient is LTC currently, CSW spoke to patient's daughter, and she is requesting a different facility.  CSW explained to her that because patient is LTC, it will be difficult to find another facility that has LTC beds available.  Per SNF patient may be able to be approved for SNF under rehab benefits.  TOC will begin insurance auth, once patient is closer to being ready for discharge.  This CSW to send patient's information to other SNFs to see if any facility will be able to accept patient under rehab, then transition to LTC.  TOC to continue to follow patient's progress throughout discharge planning.  Expected Discharge Plan: Skilled Nursing Facility Barriers to Discharge: Insurance Authorization, Continued Medical Work up   Patient Goals and CMS Choice Patient states their goals for this hospitalization and ongoing recovery are:: To return back to SNF. CMS Medicare.gov Compare Post Acute Care list provided to:: Patient Represenative (must comment) Choice offered to / list presented to : Adult Children  Expected Discharge Plan and Services Expected Discharge Plan: [place] Choice: [place] arrangements for the past 2 months: [place]                                      Prior Living Arrangements/Services Living arrangements for the past 2 months: [place] Lives with:: Facility Resident Patient language and need for interpreter reviewed:: Yes Do you feel safe going back to the place where you live?: No   Patient's daughter would like a  different facility if possible.  Need for Family Participation in Patient Care: Yes (Comment) Care giver support system in place?: Yes (comment)   Criminal Activity/Legal Involvement Pertinent to Current Situation/Hospitalization: No - Comment as needed  Activities of Daily Living Home Assistive Devices/Equipment: Blood pressure cuff, Grab bars around toilet, Grab bars in shower, Hand-held shower hose, Hospital bed, [employer], CBG Meter, Scales, Wheelchair ADL Screening (condition at time of admission) Patient's cognitive ability adequate to safely complete daily activities?: No Is the patient deaf or have difficulty hearing?: No Does the patient have difficulty seeing, even when wearing glasses/contacts?: No Does the patient have difficulty concentrating, remembering, or making decisions?: Yes Patient able to express need for assistance with ADLs?: Yes Does the patient have difficulty dressing or bathing?: Yes Independently performs ADLs?: No Communication: Independent Dressing (OT): Dependent Is this a change from baseline?: Pre-admission baseline Grooming: Dependent Is this a change from baseline?: Pre-admission baseline Feeding: Dependent Is this a change from baseline?: Pre-admission baseline Bathing: Dependent Is this a change from baseline?: Pre-admission baseline Toileting: Dependent Is this a change from baseline?: Pre-admission baseline In/Out Bed: Dependent Is this a change from baseline?: Pre-admission baseline Walks in Home: Dependent Is this a change from baseline?: Pre-admission baseline Does the patient have difficulty walking or climbing stairs?: Yes (secondary to weakness) Weakness of Legs: Both Weakness of Arms/Hands: None  Permission Sought/Granted Permission sought to share information with : Case Manager, Customer service manager, Family Supports Permission granted to share information with : Yes, Verbal Permission Granted  Share Information with  NAME: Connie Ruiz, Connie Ruiz Daughter [phone]    Rica Koyanagi   [phone]  Permission granted to share info w AGENCY: SNF admissions        Emotional Assessment Appearance:: Appears stated age     Orientation: : Oriented to Self, Oriented to Place, Oriented to  Time, Oriented to Situation Alcohol / Substance Use: Not Applicable Psych Involvement: No (comment)  Admission diagnosis:  Enteritis [K52.9] Tachycardia [R00.0] Lower abdominal pain [R10.30] C. 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   Hypothyroidism 01/14/2018   HLD (hyperlipidemia) 01/14/2018   CVA (cerebral vascular accident) ([place]) 01/12/2018   Chest pain 01/11/2018   Acute respiratory failure with hypoxia ([place]) 07/20/2017   Abdominal bloating 07/20/2017   Sinus tachycardia 07/20/2017   SLE (systemic lupus erythematosus related syndrome) ([place]) 07/20/2017   CAP (community acquired pneumonia) 07/16/2017   Screening examination for infectious disease 01/28/2016   Type 2 diabetes mellitus ([place]) 08/03/2015   Urinary tract infection 07/20/2015   Vascular dementia ([place]) 05/08/2015   Hemiparesis as late effect of cerebrovascular accident (CVA) ([place]) 05/08/2015   Aphasia as late effect of stroke 05/08/2015   C. difficile colitis 02/25/2015   Hypokalemia 02/25/2015   CKD (chronic kidney disease) 02/25/2015   Colitis 02/14/2015   Sepsis ([place]) 02/14/2015   Nausea vomiting and diarrhea 02/14/2015   Abdominal pain 02/14/2015   UTI (lower urinary tract infection) 02/14/2015   Abdominal pain, chronic, generalized 01/07/2015   Candida esophagitis ([place]) 11/12/2014   Abnormal CT of the abdomen    Steroid-induced hyperglycemia 11/09/2014   Hypomagnesemia 11/06/2014   Abdominal pain, acute    Supratherapeutic INR 11/02/2014   Jejunitis 11/02/2014   Myalgia and myositis 04/05/2014   Wellness examination 04/05/2014    Menopausal state 04/05/2014   Palpitations 08/31/2013   Hyperlipidemia 08/31/2013   Disorder of heart rhythm 08/13/2013   TIA (transient ischemic attack) 06/20/2013   Gout flare: R elbow and R shoulder 06/20/2013   Acute encephalopathy 06/14/2013   Rhabdomyolysis 06/14/2013   History of stroke with residual effects    Right sided weakness    Breast mass, left 04/30/2013   Contusion of knee, left 10/18/2012   Depression 10/05/2012   Encounter for long-term (current) use of other medications 10/04/2012   Multiple thyroid nodules 06/08/2012   Risk for falls 05/05/2012   Physical deconditioning 05/05/2012   Fever 04/27/2012   DVT (deep venous thrombosis) ([place]) 06/17/2011   Expressive aphasia 06/17/2011   Dysphasia 06/11/2011   Incontinence of urine 06/11/2011   Hand pain, left 06/11/2011   Immunosuppressive management encounter following kidney transplant 06-02-2011   Hypertension goal BP (blood pressure) < 130/80 2011/06/02   Deceased-donor kidney transplant recipient 06-02-2011   Arm pain, left 05/07/2011   Dysfunctional voiding of urine 04/28/2011   Urge incontinence 04/28/2011   Urinary urgency 04/28/2011   Routine general medical examination at a health care facility 04/13/2011   Anticoagulated 01/08/2011   Pulmonary embolism ([place]) 07/16/2010   Cerebral artery occlusion with cerebral infarction ([place]) 04/17/2010   CEREBROVASCULAR ACCIDENT, ACUTE 04/15/2010   THYROID NODULE, LEFT 04/10/2009   Cough 03/26/2009   ACUTE BRONCHITIS 08/22/2007   KIDNEY TRANSPLANTATION, HX OF 08/22/2007   Gout 08/21/2007   Essential hypertension 08/21/2007   Chronic  diastolic congestive heart failure ([place]) 08/21/2007   GERD 08/21/2007   Disorder resulting from impaired renal function 08/21/2007   LUPUS 08/21/2007   Osteoporosis 08/21/2007   DVT, HX OF 08/21/2007   Enteritis due to Clostridium difficile 08/21/2007   PCP:  [name], [name], MD Pharmacy:   [facility] [place], [place] [address] Phone: [phone] Fax: [phone]  [facility] [place], [place] - [address] [place] [address] Phone: [phone] Fax: [phone]     Social Determinants of Health ([place]) Interventions    Readmission Risk Interventions No flowsheet data found.

## 2021-02-02 NOTE — NC FL2 (Signed)
[place] LEVEL OF CARE SCREENING TOOL     IDENTIFICATION  Patient Name: Connie Ruiz Birthdate: [birth year] Sex: female Admission Date (Current Location): 01/26/2021  [place] and [place] Number:  Kathleen Argue [id number] [place] and Address:  [facility],  [place], [place]      Provider Number: [id number]  Attending Physician Name and Address:  Mendel Corning, MD  Relative Name and Phone Number:  Makia, Bossi Daughter [phone]    Harriet Masson Brother   [phone]    Current Level of Care: Hospital Recommended Level of Care: [place] Prior Approval Number:    Date Approved/Denied:   PASRR Number: [id number]  Discharge Plan: SNF    Current Diagnoses: Patient Active Problem List   Diagnosis Date Noted   Enteritis 01/27/2021   Hypothyroidism, 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   Hypothyroidism 01/14/2018   HLD (hyperlipidemia) 01/14/2018   CVA (cerebral vascular accident) ([place]) 01/12/2018   Chest pain 01/11/2018   Acute respiratory failure with hypoxia ([place]) 07/20/2017   Abdominal bloating 07/20/2017   Sinus tachycardia 07/20/2017   SLE (systemic lupus erythematosus related syndrome) ([place]) 07/20/2017   CAP (community acquired pneumonia) 07/16/2017   Screening examination for infectious disease 01/28/2016   Type 2 diabetes mellitus ([place]) 08/03/2015   Urinary tract infection 07/20/2015   Vascular dementia ([place]) 05/08/2015   Hemiparesis as late effect of cerebrovascular accident (CVA) ([place]) 05/08/2015   Aphasia as late effect of stroke 05/08/2015   C. difficile colitis 02/25/2015   Hypokalemia 02/25/2015   CKD (chronic kidney disease) 02/25/2015   Colitis 02/14/2015   Sepsis ([place]) 02/14/2015   Nausea vomiting and diarrhea 02/14/2015   Abdominal pain 02/14/2015   UTI (lower urinary tract infection) 02/14/2015   Abdominal pain, chronic, generalized 01/07/2015   Candida esophagitis ([place]) 11/12/2014   Abnormal  CT of the abdomen    Steroid-induced hyperglycemia 11/09/2014   Hypomagnesemia 11/06/2014   Abdominal pain, acute    Supratherapeutic INR 11/02/2014   Jejunitis 11/02/2014   Myalgia and myositis 04/05/2014   Wellness examination 04/05/2014   Menopausal state 04/05/2014   Palpitations 08/31/2013   Hyperlipidemia 08/31/2013   Disorder of heart rhythm 08/13/2013   TIA (transient ischemic attack) 06/20/2013   Gout flare: R elbow and R shoulder 06/20/2013   Acute encephalopathy 06/14/2013   Rhabdomyolysis 06/14/2013   History of stroke with residual effects    Right sided weakness    Breast mass, left 04/30/2013   Contusion of knee, left 10/18/2012   Depression 10/05/2012   Encounter for long-term (current) use of other medications 10/04/2012   Multiple thyroid nodules 06/08/2012   Risk for falls 05/05/2012   Physical deconditioning 05/05/2012   Fever 04/27/2012   DVT (deep venous thrombosis) ([place]) 06/17/2011   Expressive aphasia 06/17/2011   Dysphasia 06/11/2011   Incontinence of urine 06/11/2011   Hand pain, left 06/11/2011   Immunosuppressive management encounter following kidney transplant 2011/06/14   Hypertension goal BP (blood pressure) < 130/80 Jun 14, 2011   Deceased-donor kidney transplant recipient Jun 14, 2011   Arm pain, left 05/07/2011   Dysfunctional voiding of urine 04/28/2011   Urge incontinence 04/28/2011   Urinary urgency 04/28/2011   Routine general medical examination at a health care facility 04/13/2011   Anticoagulated 01/08/2011   Pulmonary embolism ([place]) 07/16/2010   Cerebral artery occlusion with cerebral infarction ([place]) 04/17/2010   CEREBROVASCULAR ACCIDENT, ACUTE 04/15/2010   THYROID NODULE, LEFT 04/10/2009   Cough 03/26/2009   ACUTE BRONCHITIS 08/22/2007   KIDNEY TRANSPLANTATION, HX OF  08/22/2007   Gout 08/21/2007   Essential hypertension 08/21/2007   Chronic diastolic congestive heart failure ([place]) 08/21/2007   GERD 08/21/2007   Disorder  resulting from impaired renal function 08/21/2007   LUPUS 08/21/2007   Osteoporosis 08/21/2007   DVT, HX OF 08/21/2007   Enteritis due to Clostridium difficile 08/21/2007    Orientation RESPIRATION BLADDER Height & Weight     Self, Time, Situation, Place  O2 (2L) Incontinent Weight: 216 lb 14.4 oz (98.4 kg) Height:  5\' 4"  (162.6 cm)  BEHAVIORAL SYMPTOMS/MOOD NEUROLOGICAL BOWEL NUTRITION STATUS      Incontinent Diet  AMBULATORY STATUS COMMUNICATION OF NEEDS Skin   Limited Assist Verbally Normal                       Personal Care Assistance Level of Assistance  Bathing, Feeding, Dressing Bathing Assistance: Limited assistance Feeding assistance: Limited assistance Dressing Assistance: Limited assistance     Functional Limitations Info  Sight, Speech, Hearing Sight Info: Adequate Hearing Info: Adequate Speech Info: Adequate    SPECIAL CARE FACTORS FREQUENCY  PT (By licensed PT), OT (By licensed OT)     PT Frequency: Minimum 5x a week OT Frequency: Minimum 5x a week            Contractures Contractures Info: Not present    Additional Factors Info  Code Status, Allergies, Insulin Sliding Scale, Psychotropic Code Status Info: Full Code Allergies Info: Oxycodone-acetaminophen   Propoxyphene   Propoxyphene N-acetaminophen   Sulfonamide Derivatives   Codeine   Hydrocodone-acetaminophen   Hydromorphone   Other   Oxycodone   Sulfamethoxazole   Tape   Gabapentin   Latex   Metoprolol   Morphine And Related   Rosiglitazone Psychotropic Info: mirtazapine (REMERON) tablet 30 mg Insulin Sliding Scale Info: insulin aspart (novoLOG) injection 0-9 Units 3x a day with meals.       Current Medications (02/02/2021):  This is the current hospital active medication list Current Facility-Administered Medications  Medication Dose Route Frequency Provider Last Rate Last Admin   acetaminophen (TYLENOL) tablet 650 mg  650 mg Oral Q6H PRN Rai, Ripudeep K, MD       Or   acetaminophen  (TYLENOL) suppository 650 mg  650 mg Rectal Q6H PRN Rai, Ripudeep K, MD   650 mg at 01/30/21 1751   dextrose 5 % and 0.45 % NaCl with KCl 20 mEq/L infusion   Intravenous Continuous Rai, Ripudeep K, MD 50 mL/hr at 02/02/21 0505 New Bag at 02/02/21 0505   diltiazem (CARDIZEM) 125 mg in dextrose 5% 125 mL (1 mg/mL) infusion  5-15 mg/hr Intravenous Continuous Rai, Ripudeep K, MD 5 mL/hr at 02/02/21 1439 5 mg/hr at 02/02/21 1439   donepezil (ARICEPT) tablet 10 mg  10 mg Oral QHS Rai, Ripudeep K, MD   10 mg at 02/01/21 2135   famotidine (PEPCID) IVPB 20 mg premix  20 mg Intravenous Q12H Rai, Ripudeep K, MD 100 mL/hr at 02/02/21 1118 20 mg at 02/02/21 1118   fentaNYL (SUBLIMAZE) injection 25-50 mcg  25-50 mcg Intravenous Q2H PRN Rai, Ripudeep K, MD   50 mcg at 02/02/21 1331   fidaxomicin (DIFICID) tablet 200 mg  200 mg Oral BID Rai, Ripudeep K, MD   200 mg at 02/02/21 1118   heparin ADULT infusion 100 units/mL (25000 units/217mL)  750 Units/hr Intravenous Continuous Green, Terri L, RPH 7.5 mL/hr at 02/02/21 1320 750 Units/hr at 02/02/21 1320   hydrALAZINE (APRESOLINE) injection 10 mg  10  mg Intravenous Q6H PRN Rai, Ripudeep K, MD       hydrocortisone sodium succinate (SOLU-CORTEF) 100 MG injection 25 mg  25 mg Intravenous BID Rai, Ripudeep K, MD   25 mg at 02/02/21 1115   insulin aspart (novoLOG) injection 0-5 Units  0-5 Units Subcutaneous QHS Rai, Ripudeep K, MD       insulin aspart (novoLOG) injection 0-9 Units  0-9 Units Subcutaneous TID WC Rai, Ripudeep K, MD   1 Units at 02/02/21 1320   levothyroxine (SYNTHROID, LEVOTHROID) injection 75 mcg  75 mcg Intravenous Daily Rai, Ripudeep K, MD   75 mcg at 02/02/21 1114   metoCLOPramide (REGLAN) injection 10 mg  10 mg Intravenous Q6H Rai, Ripudeep K, MD   10 mg at 02/02/21 1440   mirtazapine (REMERON) tablet 30 mg  30 mg Oral QHS Rai, Ripudeep K, MD   30 mg at 02/01/21 2135   mycophenolate (CELLCEPT) capsule 250 mg  250 mg Oral BID Rai, Ripudeep K, MD   250 mg  at 02/02/21 1118   promethazine (PHENERGAN) 25 mg in sodium chloride 0.9 % 50 mL IVPB  25 mg Intravenous Q6H PRN Rai, Ripudeep K, MD       tacrolimus (PROGRAF) capsule 2.5 mg  2.5 mg Oral Q12H Rai, Ripudeep K, MD   2.5 mg at 02/02/21 1119     Discharge Medications: Please see discharge summary for a list of discharge medications.  Relevant Imaging Results:  Relevant Lab Results:   Additional Information SSN [id number]  Ross Ludwig, LCSW

## 2021-02-02 NOTE — Progress Notes (Signed)
ANTICOAGULATION CONSULT NOTE - follow up  Pharmacy Consult for Heparin  Indication: H/O DVT/PE  Patient Measurements: Height: 5\' 4"  (162.6 cm) Weight: 98.4 kg (216 lb 14.4 oz) IBW/kg (Calculated) : 54.7 Heparin Dosing Weight: 76 kg  Medications:  Warfarin PTA dosing of 6.5 mg daily  Assessment: 60 y/o F [name] on warfarin PTA for a h/o DVT/PE. Pharmacy consulted to dose heparin until patient consistently tolerating PO. Patient did receive vitamin K 2 mg iv on 8/27.   02/02/2021 Heparin level 0.34, therapeutic on heparin 750 units/hr No bleeding or complications reported.    Goal of Therapy:  Heparin level 0.3-0.7 units/ml Monitor platelets by anticoagulation protocol: Yes   Plan:  Continue heparin to 750 units/hr Confirmatory heparin level in ~ 6 hours with AM labs Daily CBC, Heparin level   Gretta Arab PharmD, BCPS Clinical Pharmacist [facility] main pharmacy [phone] 02/02/2021 9:37 PM

## 2021-02-02 NOTE — Consult Note (Signed)
Date of Admission:  01/26/2021          Reason for Consult: Concern for colostrum difficile infection    Referring Provider: Estill Cotta, MD   Assessment:  Diarrhea with nausea and CT scan initially showing enteritis but subsequent CT showing no intra-abdominal pathology Stool studies with C. difficile antigen positive toxin negative PCR positive but without radiographic findings to suggest C. Difficile (unless it was enteritis that has resolved on imaging) Hx of renal transplantation that she has maintained for more than a decade on no suppressive therapy Murmur Lupus history of DVT on anticoagulation Insulin Dependent diabetes mellitus  Plan:  Would DC rectal vancomycin enemas OK to continue daptomycin for now though I do not think she has C. difficile infection If her diarrhea persists she will need further investigation by gastroenterology lower endoscopy. I will check CMV PCR quant in blood though if positive this will not prove end organ pathology Enteric precautions TTE  Principal Problem:   Enteritis Active Problems:   Essential hypertension   Chronic diastolic congestive heart failure (HCC)   LUPUS   KIDNEY TRANSPLANTATION, HX OF   DVT (deep venous thrombosis) (HCC)   Hyperlipidemia   C. difficile colitis   Type 2 diabetes mellitus (HCC)   Sinus tachycardia   SLE (systemic lupus erythematosus related syndrome) (HCC)   Hypothyroidism   Deceased-donor kidney transplant recipient   Scheduled Meds:  donepezil  10 mg Oral QHS   fidaxomicin  200 mg Oral BID   hydrocortisone sod succinate (SOLU-CORTEF) inj  25 mg Intravenous BID   insulin aspart  0-5 Units Subcutaneous QHS   insulin aspart  0-9 Units Subcutaneous TID WC   levothyroxine  75 mcg Intravenous Daily   metoCLOPramide (REGLAN) injection  10 mg Intravenous Q6H   mirtazapine  30 mg Oral QHS   mycophenolate  250 mg Oral BID   tacrolimus  2.5 mg Oral Q12H   Continuous Infusions:  dextrose 5 % and  0.45 % NaCl with KCl 20 mEq/L 50 mL/hr at 02/02/21 0505   diltiazem (CARDIZEM) infusion 5 mg/hr (02/01/21 1131)   famotidine (PEPCID) IV 20 mg (02/02/21 1118)   heparin 750 Units/hr (02/02/21 1320)   promethazine (PHENERGAN) injection (IM or IVPB)     PRN Meds:.acetaminophen **OR** acetaminophen, fentaNYL (SUBLIMAZE) injection, hydrALAZINE, promethazine (PHENERGAN) injection (IM or IVPB)  HPI: Connie Ruiz is a 60 y.o. female with history of lupus, CVA congestive heart failure insulin-dependent diabetes mellitus deep venous thrombosis, end-stage renal disease status post renal transplantation admitted to the hospital with profuse diarrhea abdominal pain nausea and generalized weakness.  Her CT of the abdomen pelvis performed on January 26, 2021 showed thickening of the jejunal folds consistent with possible enteritis, diverticulosis but no diverticulitis and no colitis along with atrophic native kidneys and renal transplant present.  Her stool C. difficile antigen was positive but toxin was negative, PCR positive.  Initially started on ceftriaxone and Flagyl on admission then changed over to vancomycin but did not have improvement in her diarrhea.  She was then switched over to Pradaxa myosin along with vancomycin enemas.  Repeat CT of the abdomen pelvis was performed and failed to show any evidence of ileus or small bowel obstruction or any bowel pathology other than her diverticulosis without diverticulitis.  Continues to complain of abdominal pain and loose stools.  I think her clinical picture in particular in conjunction with her 2 CT scans is not consistent with Clostridium difficile colitis or  enteritis.  Not only her stool studies inconclusive for C. difficile infection her CT scan on admission was not at all typical of C. difficile for seal infection with the colon being without evidence of any inflammation and only a suggestion of enteritis which has subsequently disappeared on repeat  CT scan.  Her diarrhea should also have improved with treatment for C. difficile and it apparently has not improved.  I will stop her vancomycin enemas and continue for Doxy mycin for now though I think that C. difficile is NOT the cause of her diarrhea.  If her diarrhea continues would back she needs endoscopy in particular given her immunocompromise state.  I will do CMV PCR on peripheral blood that this will not prove that she has for example CMV infection in the gastrointestinal tract.  Did have a fairly loud murmur on my exam today.  I do not see documentation of valvular disease on her transthoracic echocardiogram that was performed in January 2015.    I spent 14minutes with the patient including than 50% of time in face to face counseling of the patient personally CT scan performed on August 22 and 25, 2022 her stool studies on admission her CBC and CMP, in review of medical records in preparation for the visit and during the visit and in coordination of her care.    Review of Systems: Review of Systems  Constitutional:  Positive for malaise/fatigue. Negative for chills, diaphoresis, fever and weight loss.  HENT:  Negative for congestion, hearing loss, sore throat and tinnitus.   Eyes:  Negative for blurred vision and double vision.  Respiratory:  Negative for cough, sputum production, shortness of breath and wheezing.   Cardiovascular:  Negative for chest pain, palpitations and leg swelling.  Gastrointestinal:  Positive for diarrhea and nausea. Negative for abdominal pain, blood in stool, constipation, heartburn, melena and vomiting.  Genitourinary:  Negative for dysuria, flank pain and hematuria.  Musculoskeletal:  Negative for back pain, falls, joint pain and myalgias.  Skin:  Negative for itching and rash.  Neurological:  Negative for dizziness, sensory change, focal weakness, loss of consciousness, weakness and headaches.  Endo/Heme/Allergies:  Does not bruise/bleed easily.   Psychiatric/Behavioral:  Positive for depression. Negative for memory loss and suicidal ideas. The patient is not nervous/anxious.    Past Medical History:  Diagnosis Date   Anxiety    Candida esophagitis ([place]) 11/12/2014   CEREBROVASCULAR ACCIDENT, ACUTE 04/15/2010   CLOSTRIDIUM DIFFICILE COLITIS, HX OF 08/21/2007   CONGESTIVE HEART FAILURE 08/21/2007   Current use of long term anticoagulation    Dr. Andree Elk, [facility]   CVA 04/17/2010   Depression    Dr. Andree Elk, [place], TYPE II 08/21/2007   DVT, HX OF 08/21/2007   GERD 08/21/2007   GOUT 08/21/2007   History of stroke with residual effects    HYPERLIPIDEMIA 08/21/2007   HYPERTENSION 08/21/2007   Dr. Andree Elk, [place], HX OF 08/22/2007   s/p renal transplant-Dr. Adams, [place] 08/21/2007   OSTEOPOROSIS 08/21/2007   Rheumatol at [facility]   Pulmonary embolism ([place]) 07/16/2010   Renal failure    RENAL INSUFFICIENCY 08/21/2007   Right sided weakness    Steroid-induced hyperglycemia 11/09/2014   Tachycardia    THYROID NODULE, LEFT 04/10/2009    Social History   Tobacco Use   Smoking status: Never   Smokeless tobacco: Never  Vaping Use   Vaping Use: Never used  Substance Use Topics   Alcohol  use: No    Alcohol/week: 0.0 standard drinks   Drug use: No    Family History  Problem Relation Age of Onset   Heart attack Mother    Heart disease Father    Asthma Sister    Asthma Daughter    Cancer Maternal Grandfather        prostate   Cancer Paternal Grandfather        colon   Allergies  Allergen Reactions   Oxycodone-Acetaminophen Shortness Of Breath and Nausea Only   Propoxyphene Nausea Only and Shortness Of Breath    States takes tylenol at home   Propoxyphene N-Acetaminophen Shortness Of Breath and Nausea Only   Sulfonamide Derivatives Shortness Of Breath and Nausea Only   Codeine Nausea Only   Hydrocodone-Acetaminophen    Hydromorphone    Other Other (See Comments)    No blood,  [employer]ov[employer] Witness    Oxycodone    Sulfamethoxazole    Tape     Redness**PAPER TAPE OK**   Gabapentin Anxiety    twitching   Latex Rash   Metoprolol Rash   Morphine And Related Rash    IV site on arm is red, patient reports this is improving.  NO shortness of breath reported.   Rosiglitazone Rash    OBJECTIVE: Blood pressure 137/87, pulse 94, temperature 98.4 F (36.9 C), resp. rate 17, height 5\' 4"  (1.626 m), weight 98.4 kg, SpO2 97 %.  Physical Exam Constitutional:      General: She is not in acute distress.    Appearance: Normal appearance. She is well-developed. She is obese. She is not ill-appearing or diaphoretic.  HENT:     Head: Normocephalic and atraumatic.     Right Ear: Hearing and external ear normal.     Left Ear: Hearing and external ear normal.     Nose: No nasal deformity or rhinorrhea.  Eyes:     General: No scleral icterus.    Conjunctiva/sclera: Conjunctivae normal.     Right eye: Right conjunctiva is not injected.     Left eye: Left conjunctiva is not injected.     Pupils: Pupils are equal, round, and reactive to light.  Neck:     Vascular: No JVD.  Cardiovascular:     Rate and Rhythm: Normal rate and regular rhythm.     Heart sounds: S1 normal and S2 normal. Murmur heard.    No friction rub.  Pulmonary:     Effort: No respiratory distress.     Breath sounds: No stridor. No wheezing.  Abdominal:     General: Bowel sounds are normal. There is no distension.     Palpations: Abdomen is soft. There is no mass.     Tenderness: There is no abdominal tenderness.  Musculoskeletal:        General: Normal range of motion.     Right shoulder: Normal.     Left shoulder: Normal.     Cervical back: Normal range of motion and neck supple.     Right hip: Normal.     Left hip: Normal.     Right knee: Normal.     Left knee: Normal.  Lymphadenopathy:     Head:     Right side of head: No submandibular, preauricular or posterior auricular adenopathy.     Left  side of head: No submandibular, preauricular or posterior auricular adenopathy.     Cervical: No cervical adenopathy.     Right cervical: No superficial or deep cervical adenopathy.  Left cervical: No superficial or deep cervical adenopathy.  Skin:    General: Skin is warm and dry.     Coloration: Skin is not pale.     Findings: No abrasion, bruising, ecchymosis, erythema, lesion or rash.     Nails: There is no clubbing.  Neurological:     General: No focal deficit present.     Mental Status: She is alert and oriented to person, place, and time.     Sensory: No sensory deficit.  Psychiatric:        Attention and Perception: Attention normal. She is attentive.        Mood and Affect: Mood is depressed.        Speech: Speech normal.        Behavior: Behavior normal. Behavior is cooperative.        Thought Content: Thought content normal.        Cognition and Memory: Cognition normal.        Judgment: Judgment normal.    Lab Results Lab Results  Component Value Date   WBC 10.0 02/02/2021   HGB 12.6 02/02/2021   HCT 39.6 02/02/2021   MCV 85.9 02/02/2021   PLT 190 02/02/2021    Lab Results  Component Value Date   CREATININE 0.45 02/01/2021   BUN 8 02/01/2021   NA 140 02/01/2021   K 3.6 02/01/2021   CL 112 (H) 02/01/2021   CO2 23 02/01/2021    Lab Results  Component Value Date   ALT 10 02/01/2021   AST 12 (L) 02/01/2021   ALKPHOS 47 02/01/2021   BILITOT 0.4 02/01/2021     Microbiology: Recent Results (from the past 240 hour(s))  Resp Panel by RT-PCR (Flu A&B, Covid) Nasopharyngeal Swab     Status: None   Collection Time: 01/26/21  9:56 PM   Specimen: Nasopharyngeal Swab; Nasopharyngeal(NP) swabs in vial transport medium  Result Value Ref Range Status   SARS Coronavirus 2 by RT PCR NEGATIVE NEGATIVE Final    Comment: (NOTE) SARS-CoV-2 target nucleic acids are NOT DETECTED.  The SARS-CoV-2 RNA is generally detectable in upper respiratory specimens during the  acute phase of infection. The lowest concentration of SARS-CoV-2 viral copies this assay can detect is 138 copies/mL. A negative result does not preclude SARS-Cov-2 infection and should not be used as the sole basis for treatment or other patient management decisions. A negative result may occur with  improper specimen collection/handling, submission of specimen other than nasopharyngeal swab, presence of viral mutation(s) within the areas targeted by this assay, and inadequate number of viral copies(<138 copies/mL). A negative result must be combined with clinical observations, patient history, and epidemiological information. The expected result is Negative.  Fact Sheet for Patients:  EntrepreneurPulse.com.au  Fact Sheet for Healthcare Providers:  IncredibleEmployment.be  This test is no t yet approved or cleared by the [place] FDA and  has been authorized for detection and/or diagnosis of SARS-CoV-2 by FDA under an Emergency Use Authorization (EUA). This EUA will remain  in effect (meaning this test can be used) for the duration of the COVID-19 declaration under Section 564(b)(1) of the Act, 21 U.S.C.section 360bbb-3(b)(1), unless the authorization is terminated  or revoked sooner.       Influenza A by PCR NEGATIVE NEGATIVE Final   Influenza B by PCR NEGATIVE NEGATIVE Final    Comment: (NOTE) The Xpert Xpress SARS-CoV-2/FLU/RSV plus assay is intended as an aid in the diagnosis of influenza from Nasopharyngeal swab specimens and should  not be used as a sole basis for treatment. Nasal washings and aspirates are unacceptable for Xpert Xpress SARS-CoV-2/FLU/RSV testing.  Fact Sheet for Patients: EntrepreneurPulse.com.au  Fact Sheet for Healthcare Providers: IncredibleEmployment.be  This test is not yet approved or cleared by the [place] FDA and has been authorized for detection and/or  diagnosis of SARS-CoV-2 by FDA under an Emergency Use Authorization (EUA). This EUA will remain in effect (meaning this test can be used) for the duration of the COVID-19 declaration under Section 564(b)(1) of the Act, 21 U.S.C. section 360bbb-3(b)(1), unless the authorization is terminated or revoked.  Performed at [facility], [place] [address] [place], [place] [address]   Blood culture (routine x 2)     Status: None   Collection Time: 01/27/21 12:32 AM   Specimen: Left Antecubital; Blood  Result Value Ref Range Status   Specimen Description   Final    LEFT ANTECUBITAL Performed at [place] [address], [place], [place] [address]    Special Requests   Final    BOTTLES DRAWN AEROBIC AND ANAEROBIC Blood Culture adequate volume Performed at [place] [address], [place], [place] [address]    Culture   Final    NO GROWTH 5 DAYS Performed at [facility], [place] [address], [place], [place] [address]    Report Status 02/01/2021 FINAL  Final  Blood culture (routine x 2)     Status: None   Collection Time: 01/27/21  3:21 AM   Specimen: Left Antecubital; Blood  Result Value Ref Range Status   Specimen Description   Final    LEFT ANTECUBITAL Performed at [place] [address], [place], [place] [address]    Special Requests   Final    BOTTLES DRAWN AEROBIC AND ANAEROBIC Blood Culture results may not be optimal due to an excessive volume of blood received in culture bottles Performed at [facility] [address] [place], [place] [address]    Culture   Final    NO GROWTH 5 DAYS Performed at [facility], [place] [address], [place], [place] [address]    Report Status 02/01/2021 FINAL  Final  Gastrointestinal Panel by PCR , Stool     Status: None   Collection Time: 01/27/21  6:33 AM   Specimen: Stool  Result Value Ref Range Status   Campylobacter species  NOT DETECTED NOT DETECTED Final   Plesimonas shigelloides NOT DETECTED NOT DETECTED Final   Salmonella species NOT DETECTED NOT DETECTED Final   Yersinia enterocolitica NOT DETECTED NOT DETECTED Final   Vibrio species NOT DETECTED NOT DETECTED Final   Vibrio cholerae NOT DETECTED NOT DETECTED Final   Enteroaggregative E coli (EAEC) NOT DETECTED NOT DETECTED Final   Enteropathogenic E coli (EPEC) NOT DETECTED NOT DETECTED Final   Enterotoxigenic E coli (ETEC) NOT DETECTED NOT DETECTED Final   Shiga like toxin producing E coli (STEC) NOT DETECTED NOT DETECTED Final   Shigella/Enteroinvasive E coli (EIEC) NOT DETECTED NOT DETECTED Final   Cryptosporidium NOT DETECTED NOT DETECTED Final   Cyclospora cayetanensis NOT DETECTED NOT DETECTED Final   Entamoeba histolytica NOT DETECTED NOT DETECTED Final   Giardia lamblia NOT DETECTED NOT DETECTED Final   Adenovirus F40/41 NOT DETECTED NOT DETECTED Final   Astrovirus NOT DETECTED NOT DETECTED Final   Norovirus GI/GII NOT DETECTED NOT DETECTED Final   Rotavirus A NOT DETECTED NOT DETECTED Final   Sapovirus (I,  II, IV, and V) NOT DETECTED NOT DETECTED Final    Comment: Performed at [facility], [place], [place] [address]  C Difficile Quick Screen w PCR reflex     Status: Abnormal   Collection Time: 01/27/21  6:33 AM   Specimen: Stool  Result Value Ref Range Status   C Diff antigen POSITIVE (A) NEGATIVE Final   C Diff toxin NEGATIVE NEGATIVE Final   C Diff interpretation Results are indeterminate. See PCR results.  Final    Comment: Performed at [facility], [place] [address], [place], [place] [address]  C. Diff by PCR, Reflexed     Status: Abnormal   Collection Time: 01/27/21  6:33 AM  Result Value Ref Range Status   Toxigenic C. Difficile by PCR POSITIVE (A) NEGATIVE Final    Comment: Positive for toxigenic C. difficile with little to no toxin production. Only treat if clinical presentation suggests  symptomatic illness. Performed at [facility], [place] [address], [place], [place] [address]   Culture, blood (routine x 2)     Status: None (Preliminary result)   Collection Time: 01/30/21  7:11 PM   Specimen: BLOOD  Result Value Ref Range Status   Specimen Description   Final    BLOOD BLOOD LEFT HAND Performed at [place] [address], [place], [place] [address]    Special Requests   Final    BOTTLES DRAWN AEROBIC ONLY Blood Culture adequate volume Performed at [place] [address], [place], [place] [address]    Culture   Final    NO GROWTH 2 DAYS Performed at [place] [address], [place], [place] [address]    Report Status PENDING  Incomplete  Culture, blood (routine x 2)     Status: None (Preliminary result)   Collection Time: 01/30/21  7:11 PM   Specimen: BLOOD  Result Value Ref Range Status   Specimen Description   Final    BLOOD LEFT WRIST Performed at [place] [address], [place], [place] [address]    Special Requests   Final    BOTTLES DRAWN AEROBIC ONLY Blood Culture adequate volume Performed at [place] [address] [place], [place] [address]    Culture   Final    NO GROWTH 2 DAYS Performed at [place] [address] [place], [place] [address]    Report Status PENDING  Incomplete    Alcide Evener, [place] [facility] [phone] pager  02/02/2021, 1:57 PM

## 2021-02-02 NOTE — Progress Notes (Addendum)
UNASSIGNED PATIENT Subjective: Since I last evaluated the patient, her vital signs seem to be improved and her CBC has normalized but she seems more slightly more uncomfortable and complains of abdominal pain with nausea. Her antibiotics were changed from vancomycin to Dificid over the weekend with some improvement overall.  Her vomiting has stopped even though she is slightly nauseated but the diarrhea has improved as well.  Comycin enemas have been discontinued and Flexi-Seal fell out this afternoon but there was not much stool over the last 6 hours. Appreciate input from ID consult.   Objective: Vital signs in last 24 hours: Temp:  [98.2 F (36.8 C)-98.5 F (36.9 C)] 98.4 F (36.9 C) (08/29 1211) BP: (137)/(87) 137/87 (08/29 0450) SpO2:  [97 %] 97 % (08/29 0450) Weight:  [98.4 kg] 98.4 kg (08/29 0500) Last BM Date: 02/02/21  Intake/Output from previous day: 08/28 0701 - 08/29 0700 In: 1644.6 [P.O.:120; I.V.:1434.7; IV Piggyback:89.9] Out: 1250 [Urine:450; Stool:800] Intake/Output this shift: No intake/output data recorded.  General appearance: cooperative, appears stated age, fatigued, moderate distress, and morbidly obese Resp: clear to auscultation bilaterally Cardio: regular rate and rhythm, S1, S2 normal, no murmur, click, rub or gallop GI: soft, morbidly obese with mild diffuse tenderness on palpation with guarding but without rebound or rigidity; bowel sounds are hypoactive; no masses,  no organomegaly  Lab Results: Recent Labs    01/31/21 0713 02/01/21 0618 02/02/21 0356  WBC 9.4 10.7* 10.0  HGB 12.3 11.8* 12.6  HCT 39.9 36.7 39.6  PLT 283 276 190   BMET Recent Labs    01/31/21 0713 02/01/21 0618  NA 140 140  K 3.6 3.6  CL 111 112*  CO2 19* 23  GLUCOSE 205* 197*  BUN 7 8  CREATININE 0.61 0.45  CALCIUM 8.6* 8.7*   LFT Recent Labs    02/01/21 0618  PROT 6.1*  ALBUMIN 2.9*  AST 12*  ALT 10  ALKPHOS 47  BILITOT 0.4   PT/INR Recent Labs     02/01/21 0535 02/02/21 0356  LABPROT 16.2* 15.7*  INR 1.3* 1.2   Medications: I have reviewed the patient's current medications. Prior to Admission:  Medications Prior to Admission  Medication Sig Dispense Refill Last Dose   acetaminophen (TYLENOL) 500 MG tablet Take 1,000 mg by mouth 4 (four) times daily as needed for mild pain or moderate pain.   Past Month   alendronate (FOSAMAX) 70 MG tablet TAKE 1 TABLET BY MOUTH ONCE A WEEK ON AN EMPTY STOMACH ON SATURDAYS WITH A FULL GLASS OF WATER. (Patient taking differently: Take 70 mg by mouth once a week.) 12 tablet 0 01/24/2021   atorvastatin (LIPITOR) 40 MG tablet Take 40 mg by mouth daily at 6 PM.    01/25/2021   Blood Glucose Monitoring Suppl (ACCU-CHEK AVIVA PLUS) w/Device KIT 1 Device by Does not apply route daily. 1 kit 1    cholecalciferol (VITAMIN D3) 25 MCG (1000 UNIT) tablet Take 1,000 Units by mouth daily.   01/26/2021   diltiazem (TIAZAC) 360 MG 24 hr capsule Take 1 capsule (360 mg total) by mouth daily. 90 capsule 1 01/26/2021   donepezil (ARICEPT) 10 MG tablet Take 10 mg by mouth at bedtime.    01/25/2021   esomeprazole (NEXIUM) 20 MG capsule TAKE 1 CAPSULE(20 MG) BY MOUTH DAILY (Patient taking differently: Take 20 mg by mouth daily at 12 noon.) 30 capsule 11 01/26/2021   famotidine (PEPCID) 20 MG tablet Take 20 mg by mouth 2 (two) times daily.  01/26/2021   feeding supplement, ENSURE ENLIVE, (ENSURE ENLIVE) LIQD Take 237 mLs by mouth 2 (two) times daily between meals. (Patient taking differently: Take 237 mLs by mouth as needed (when pt remembers).)   01/26/2021   furosemide (LASIX) 20 MG tablet Take 20 mg by mouth daily.   01/26/2021   gabapentin (NEURONTIN) 300 MG capsule Take 300 mg by mouth 2 (two) times daily.   01/26/2021   glucose blood (ONE TOUCH ULTRA TEST) test strip Use to check blood sugar 1 time per day 100 each 2    insulin lispro (HUMALOG) 100 UNIT/ML injection Inject 0-12 Units into the skin 3 (three) times daily before  meals. Per Sliding Scale. If blood sugar is: 0-150: 0 units 151-200: 2 units 201-250: 4 units 251-300: 6 units 301-350: 8 units 351-400: 10 units 401-450: 12 units   01/26/2021   levothyroxine (SYNTHROID) 175 MCG tablet TAKE 1 TABLET(175 MCG) BY MOUTH DAILY BEFORE BREAKFAST (Patient taking differently: Take 150 mcg by mouth daily before breakfast.) 30 tablet 0 01/25/2021   losartan (COZAAR) 100 MG tablet Take 1 tablet (100 mg total) by mouth daily. 90 tablet 1 01/26/2021   magnesium oxide (MAG-OX) 400 MG tablet Take 800 mg by mouth daily.    01/26/2021   metFORMIN (GLUCOPHAGE-XR) 500 MG 24 hr tablet TAKE 1 TABLET(500 MG) BY MOUTH DAILY WITH BREAKFAST (Patient taking differently: Take 500 mg by mouth daily with breakfast.) 90 tablet 0 01/26/2021   mirtazapine (REMERON) 30 MG tablet Take 1 tablet (30 mg total) by mouth at bedtime. 30 tablet 1 01/25/2021   multivitamin (RENA-VIT) TABS tablet Take 1 tablet by mouth daily.   01/26/2021   mycophenolate (CELLCEPT) 250 MG capsule Take 250 mg by mouth 2 (two) times daily.    01/26/2021   NONFORMULARY OR COMPOUNDED ITEM [facility]:  Pain Cream - Ketamine 5%, Baclofen 2%, Gabapentin 5%, Lidocaine 5%, Menthol 1%, apply 1-2 grams to affected area 3-4 times a day for pain. 100 each 3    nystatin (MYCOSTATIN) powder Apply 1 g topically 4 (four) times daily as needed. For yeast under breast   PRN   ondansetron (ZOFRAN ODT) 4 MG disintegrating tablet 42m ODT q4 hours prn nausea/vomiting (Patient taking differently: Take 4 mg by mouth every 4 (four) hours as needed for nausea or vomiting.) 15 tablet 0 PRN   ONETOUCH DELICA LANCETS 361PMISC Use to check blood sugar 1 time per day 100 each 2    Potassium Chloride ER 20 MEQ TBCR Take 1 tablet by mouth daily. NEEDS POTASSIUM RE-CHECK. 90 tablet 1 01/26/2021   predniSONE (DELTASONE) 5 MG tablet Take 5 mg by mouth daily with breakfast.    01/26/2021   psyllium (METAMUCIL) 58.6 % packet Take 1 packet by mouth daily.    01/25/2021   sertraline (ZOLOFT) 100 MG tablet Take 0.5 tablets (50 mg total) by mouth daily. (Patient taking differently: Take 100 mg by mouth daily.) 15 tablet 2 01/26/2021   Skin Protectants, Misc. (EUCERIN) cream Apply 1 application topically daily. To feet   01/26/2021   tacrolimus (PROGRAF) 1 MG capsule Take 2.5 mg by mouth every 12 (twelve) hours.   01/26/2021 at 0900   traMADol (ULTRAM) 50 MG tablet Take 100 mg by mouth every 6 (six) hours as needed for pain.   01/23/2021   warfarin (COUMADIN) 5 MG tablet TAKE 1/2 TO 1 TABLET BY MOUTH ON MONDAY THROUGH FRIDAY; TUESDAY, WEDNESDAY, THURSDAY, SATAND SUNDAY-2.5MG (Patient taking differently: Take 6.5 mg by mouth  daily.) 50 tablet 0 01/25/2021   calcitRIOL (ROCALTROL) 0.25 MCG capsule TAKE 1 CAPSULE(0.25 MCG) BY MOUTH DAILY. NO REFILLS WITHOUT APPOINTMENT (Patient not taking: No sig reported) 30 capsule 0 Not Taking   morphine (MSIR) 15 MG tablet Take 0.5 tablets (7.5 mg total) by mouth every 6 (six) hours as needed for severe pain. (Patient not taking: No sig reported) 4 tablet 0 Not Taking   QUEtiapine (SEROQUEL) 25 MG tablet Take 1 tablet (25 mg total) by mouth at bedtime. (Patient not taking: No sig reported) 30 tablet 1 Not Taking   sucralfate (CARAFATE) 1 g tablet Take 1 tablet (1 g total) by mouth 4 (four) times daily for 30 days. (Patient not taking: Reported on 01/26/2021)   Not Taking   Scheduled:  donepezil  10 mg Oral QHS   fidaxomicin  200 mg Oral BID   hydrocortisone sod succinate (SOLU-CORTEF) inj  25 mg Intravenous BID   insulin aspart  0-5 Units Subcutaneous QHS   insulin aspart  0-9 Units Subcutaneous TID WC   levothyroxine  75 mcg Intravenous Daily   metoCLOPramide (REGLAN) injection  10 mg Intravenous Q6H   mirtazapine  30 mg Oral QHS   mycophenolate  250 mg Oral BID   tacrolimus  2.5 mg Oral Q12H   Continuous:  dextrose 5 % and 0.45 % NaCl with KCl 20 mEq/L 50 mL/hr at 02/02/21 0505   diltiazem (CARDIZEM) infusion 5 mg/hr  (02/02/21 1439)   famotidine (PEPCID) IV 20 mg (02/02/21 1118)   heparin 750 Units/hr (02/02/21 1703)   promethazine (PHENERGAN) injection (IM or IVPB)     [id number]:[id number] **OR** acetaminophen, fentaNYL (SUBLIMAZE) injection, hydrALAZINE, promethazine (PHENERGAN) injection (IM or IVPB)  Assessment/Plan: 1) Nausea without vomiting and diarrhea with abdominal pain-appreciate Dr. Lucianne Lei Dam's input. Her diarrhea seems to be improving. Vancomycin enemas have been discontinued. Continue present care. If her diarrhea does not continue to improve flexible sigmoidoscopy will be done.  2) GERD on Famotidine. 3) ESRD s/-p renal transplant 2005. 4) IDDM/HTN/Hyperlipidemia/Morbid obesity, 5) Depression & anxiety. 6) [name].   LOS: 6 days   [name] 02/02/2021, 1:34 PM

## 2021-02-03 ENCOUNTER — Inpatient Hospital Stay (HOSPITAL_COMMUNITY): Payer: Medicare Other

## 2021-02-03 DIAGNOSIS — A0472 Enterocolitis due to Clostridium difficile, not specified as recurrent: Secondary | ICD-10-CM | POA: Diagnosis not present

## 2021-02-03 DIAGNOSIS — I5032 Chronic diastolic (congestive) heart failure: Secondary | ICD-10-CM | POA: Diagnosis not present

## 2021-02-03 DIAGNOSIS — R109 Unspecified abdominal pain: Secondary | ICD-10-CM

## 2021-02-03 DIAGNOSIS — R011 Cardiac murmur, unspecified: Secondary | ICD-10-CM

## 2021-02-03 DIAGNOSIS — Z940 Kidney transplant status: Secondary | ICD-10-CM | POA: Diagnosis not present

## 2021-02-03 DIAGNOSIS — K529 Noninfective gastroenteritis and colitis, unspecified: Secondary | ICD-10-CM | POA: Diagnosis not present

## 2021-02-03 LAB — ECHOCARDIOGRAM COMPLETE
AR max vel: 3.21 cm2
AV Area VTI: 3.13 cm2
AV Area mean vel: 2.97 cm2
AV Mean grad: 4 mmHg
AV Peak grad: 7.4 mmHg
Ao pk vel: 1.36 m/s
Area-P 1/2: 3.67 cm2
S' Lateral: 2.7 cm
Single Plane A4C EF: 55.4 %

## 2021-02-03 LAB — CMV DNA, QUANTITATIVE, PCR
CMV DNA Quant: NEGATIVE IU/mL
Log10 CMV Qn DNA Pl: UNDETERMINED log10 IU/mL

## 2021-02-03 LAB — MAGNESIUM: Magnesium: 1.3 mg/dL — ABNORMAL LOW (ref 1.7–2.4)

## 2021-02-03 LAB — GLUCOSE, CAPILLARY
Glucose-Capillary: 173 mg/dL — ABNORMAL HIGH (ref 70–99)
Glucose-Capillary: 155 mg/dL — ABNORMAL HIGH (ref 70–99)

## 2021-02-03 LAB — CBC
HCT: 36 % (ref 36.0–46.0)
Hemoglobin: 11.3 g/dL — ABNORMAL LOW (ref 12.0–15.0)
MCH: 27.3 pg (ref 26.0–34.0)
MCHC: 31.4 g/dL (ref 30.0–36.0)
MCV: 87 fL (ref 80.0–100.0)
Platelets: 248 10*3/uL (ref 150–400)
RBC: 4.14 MIL/uL (ref 3.87–5.11)
RDW: 15.7 % — ABNORMAL HIGH (ref 11.5–15.5)
WBC: 9.1 10*3/uL (ref 4.0–10.5)
nRBC: 0 % (ref 0.0–0.2)

## 2021-02-03 LAB — PROTIME-INR
INR: 1.2 (ref 0.8–1.2)
Prothrombin Time: 15.6 seconds — ABNORMAL HIGH (ref 11.4–15.2)

## 2021-02-03 LAB — HEPARIN LEVEL (UNFRACTIONATED)
Heparin Unfractionated: 0.23 IU/mL — ABNORMAL LOW (ref 0.30–0.70)
Heparin Unfractionated: 0.27 IU/mL — ABNORMAL LOW (ref 0.30–0.70)

## 2021-02-03 LAB — PHOSPHORUS: Phosphorus: 1.6 mg/dL — ABNORMAL LOW (ref 2.5–4.6)

## 2021-02-03 MED ORDER — PROSOURCE PLUS PO LIQD
30.0000 mL | Freq: Two times a day (BID) | ORAL | Status: DC
  Filled 2021-02-03: qty 30

## 2021-02-03 MED ORDER — ADULT MULTIVITAMIN W/MINERALS CH
1.0000 | ORAL_TABLET | Freq: Every day | ORAL | Status: DC
  Administered 2021-02-03 – 2021-02-13 (×8): 1 via ORAL
  Filled 2021-02-03 (×10): qty 1

## 2021-02-03 MED ORDER — LEVOTHYROXINE SODIUM 75 MCG PO TABS
150.0000 ug | ORAL_TABLET | Freq: Every day | ORAL | Status: DC
  Administered 2021-02-04 – 2021-02-13 (×10): 150 ug via ORAL
  Filled 2021-02-03 (×10): qty 2

## 2021-02-03 MED ORDER — HYDROCORTISONE NA SUCCINATE PF 100 MG IJ SOLR
12.5000 mg | Freq: Two times a day (BID) | INTRAMUSCULAR | Status: DC
  Administered 2021-02-03 – 2021-02-07 (×8): 12.5 mg via INTRAVENOUS
  Filled 2021-02-03 (×8): qty 2

## 2021-02-03 MED ORDER — HEPARIN (PORCINE) 25000 UT/250ML-% IV SOLN
950.0000 [IU]/h | INTRAVENOUS | Status: AC
  Administered 2021-02-04: 950 [IU]/h via INTRAVENOUS
  Filled 2021-02-03: qty 250

## 2021-02-03 MED ORDER — DILTIAZEM HCL ER COATED BEADS 180 MG PO CP24
360.0000 mg | ORAL_CAPSULE | Freq: Every day | ORAL | Status: DC
  Administered 2021-02-03 – 2021-02-13 (×11): 360 mg via ORAL
  Filled 2021-02-03 (×11): qty 2

## 2021-02-03 MED ORDER — WARFARIN SODIUM 6 MG PO TABS
6.5000 mg | ORAL_TABLET | Freq: Once | ORAL | Status: DC
  Filled 2021-02-03: qty 1

## 2021-02-03 MED ORDER — BOOST / RESOURCE BREEZE PO LIQD CUSTOM
1.0000 | Freq: Three times a day (TID) | ORAL | Status: DC
  Administered 2021-02-03: 1 via ORAL

## 2021-02-03 MED ORDER — HEPARIN (PORCINE) 25000 UT/250ML-% IV SOLN
900.0000 [IU]/h | INTRAVENOUS | Status: DC
  Filled 2021-02-03: qty 250

## 2021-02-03 MED ORDER — PROSOURCE TF PO LIQD
45.0000 mL | Freq: Three times a day (TID) | ORAL | Status: DC
  Administered 2021-02-03 – 2021-02-06 (×7): 45 mL
  Filled 2021-02-03 (×11): qty 45

## 2021-02-03 MED ORDER — WARFARIN - PHARMACIST DOSING INPATIENT: Freq: Every day | Status: DC

## 2021-02-03 MED ORDER — OSMOLITE 1.5 CAL PO LIQD
1000.0000 mL | ORAL | Status: DC
  Administered 2021-02-03 – 2021-02-05 (×3): 1000 mL
  Filled 2021-02-03 (×4): qty 1000

## 2021-02-03 NOTE — TOC Progression Note (Signed)
Transition of Care [facility]) - Progression Note    Patient Details  Name: Connie Ruiz MRN: [id number] Date of Birth: 1961/03/30  Transition of [facility]) CM/SW Contact  Karalynn Cottone, Juliann Pulse, RN Phone Number: 02/03/2021, 9:39 AM  Clinical Narrative: Has bed offers to give dtr-await her call back, will need to start auth within 24-48hrs of d/c,& covid needed-MD/nsg notified.      Expected Discharge Plan: Skilled Nursing Facility Barriers to Discharge: Insurance Authorization  Expected Discharge Plan and Services Expected Discharge Plan: [place] Choice: [place] arrangements for the past 2 months: [place]                                       Social Determinants of Health (SDOH) Interventions    Readmission Risk Interventions No flowsheet data found.

## 2021-02-03 NOTE — Progress Notes (Signed)
Brief Nutrition Note  See previous RD note for full assessment.  RD consulted by MD for starting TF per GI recommendations. Recommended to MD to have small-bore NGT placed by ICU RN.  Initiate tube feeding via small-bore NG tube: Osmolite 1.5 at 25 ml/h and increase by 10 ml every 8 hours to goal rate of 55 ml/h (1320 ml per day) Prosource TF 45 ml TID  Provides 2100 kcal, 116 gm protein, 1003 ml free water daily.  Monitor magnesium, potassium, and phosphorus daily for at least 3 days, MD to replete as needed, as pt is at risk for refeeding syndrome given poor/limited PO over last 7 days.  If pt does not tolerate TF, may need to consider TPN per Pharmacy.  Unit RD to monitor accordingly.   Derrel Nip, RD, LDN (she/her/hers) Registered Dietitian I After-Hours/Weekend Pager # in [place]

## 2021-02-03 NOTE — TOC Progression Note (Addendum)
Transition of Care [facility]) - Progression Note    Patient Details  Name: Connie Ruiz MRN: [id number] Date of Birth: May 24, 1961  Transition of Care [facility]) CM/SW Contact  Sarim Rothman, Juliann Pulse, RN Phone Number: 02/03/2021, 9:44 AM  Clinical Narrative:   Bed offers given to dtr [name] await choice.  1. 1.3 mi [facility] [place], [place] [address] [phone] Overall rating Above average 2. 1.6 mi [place] at [place], [place] [address] [phone] Overall rating Much below average 3. 2.1 mi [place], [place] [address] [phone] Overall rating Much below average 4. 2.5 mi [facility] [place], [place] [address] [phone] Overall rating Below average 5. 2.8 mi [facility] at the [place], [place] [address] [phone] Overall rating Above average 6. 2.8 mi [place] [address] [place], [place] [address] [phone] Overall rating Below average 7. 3 mi [facility] [place], [place] [address] [phone] Overall rating Above average 8. 3.6 [place] [place] [address] [place], [place] [address] [phone] Overall rating Below average 9. 3.6 mi [facility] [address] [place], [place] [address] [phone] Overall rating Much below average 10. 3.9 mi [facility] [place], [place] [address] [phone] Overall rating Below average 11. 4.4 mi [facility] [place], [place] [address] [phone] Overall rating Much above average 12. 4.6 mi [facility] [place], [place] [address] [phone] Overall rating Above average 13. 5.5 mi [facility] [address] [place], [place] [address] [phone] Overall rating Above average 14. 8.2 [facility] [place], [place] [address] [phone] Overall rating Above average 15. 9 mi [facility] [place] [address] [place], [place] [address] [phone] Overall rating Average 16. 9.1 [place] [facility] [place], [place] [address] [phone] Overall rating Average 17. 9.2 mi [facility] [address] [place], [place] [address] [phone] Overall rating Much above average 18. 10.8 mi [place] [facility] [address] [place], [place] [address] [phone] Overall rating Much above average 19. 12.6 mi [facility] [address] [place], [place] [address] [phone] Overall rating Much below average 20. 12.8 [facility] [place], [place] [address] [phone] Overall rating Much below average 21. 14.2 mi The [place] [facility] [address] [place], [place] [address] [phone] Overall rating Much below average 22. 14.4 mi [facility] [place] [facility] [place], [place] [address] [phone] Overall rating Above average 23. 14.8 mi [place] [facility] [place], [place] [address] [phone] Overall rating Much above average 24. 14.9 [place] [address] [place], [place] [address] [phone] Overall rating Much below average 25. 16.5 mi [facility] [address] [place], [place] [address] [phone] Overall rating Above average 26. 16.7 mi [facility] [place],  [place] [address] [phone] Overall rating Much above average 27. 17.9 mi [facility] [place], [place] [address] [phone] Overall rating Below average 28. 75.1 [facility] [address] [place], [place] [address] [phone] Overall rating Much below average 29. 19.7 mi [place] [address] [place], [place] [address] [phone] Overall rating Much below average 30. 20 mi [address] at the [facility] at [facility], [place] [address] [phone] Overall rating Much above average 31. 21.1 mi [facility] [place], [place] [address] [phone] Overall rating Much below average 32. 21.6 [address] [place], [place] [address] [phone] Overall rating Below average 33. 26.2 [facility] [address] [place], [place] [address] [phone] Overall rating Below average 34. 21.8 [place], [place] [address] [phone] Overall rating Much above average 35. [address] [place], [place] [address] [phone] Overall rating Much above average 36. 22.6 mi [facility] [address] [place], [place] [address] [phone] Overall rating Average 37. 22.7 mi [facility] [place], [place] [address] [phone] Overall rating Much below average 38. 23.3 mi [facility] [address] [place], [place] [address] [phone] Overall rating Above average 39. 23.5 [place], [place] [address] [phone] Overall rating Much below average 40. 24.1 mi [place] [address] [place], [place] [address] [phone] Overall rating Much below average 41. 24.2 mi [facility] [address] [place],  [address] [phone] Overall rating Below average 42. 24.4 [facility] [address] [place],  [address] [phone] Overall rating Much below average 43. 24.5 mi [facility] [place],  [phone] Overall rating Average To explore and download nursing home data,visit the data catalog on CMS.gov    Expected Discharge Plan: Skilled Nursing Facility Barriers to Discharge: Ship broker  Expected Discharge Plan and Services Expected Discharge Plan: [place] Choice: [place] arrangements for the past 2 months: [place]                                       Social Determinants of Health (SDOH) Interventions    Readmission Risk Interventions No flowsheet data found.

## 2021-02-03 NOTE — Progress Notes (Addendum)
[facility]                                                                              Patient Demographics  Connie Ruiz, is a 60 y.o. female, DOB - Aug 07, 1960, ONG:[id number]  Admit date - 01/26/2021   Admitting Physician Donnamarie Poag [place] DO  Outpatient Primary MD for the patient is [name], [name], MD  Outpatient specialists:   LOS - 7  days   Medical records reviewed and are as summarized below:    Chief Complaint  Patient presents with   Abdominal Pain       Brief summary   Patient is 60 year old female, [name], ESRD, now status post renal transplant in 2005, hypothyroidism, gout, GERD, hyperlipidemia, diabetes mellitus type 2, SLE, DVT on warfarin presented from [facility] with abdominal pain and diarrhea for 5 days prior to admission.  Patient reported roughly 7 episodes in a day, watery, no hematochezia or melena.  Also reported generalized weakness, fatigue, no fevers or chills. C. difficile positive.  CT abdomen pelvis showed enteritis.  interval summary 8/24- 8/30 Patient was admitted with high suspicion for acute C. difficile colitis.  Initially treated with oral vancomycin with no significant improvement.  Continues to have nausea vomiting abdominal tenderness and diarrhea. -On 8/26, changed to Dificid and vancomycin enemas -GI has been following, as patient has persistent nausea, abdominal pain, abdominal tenderness and diarrhea.  Initial plan to pursue EGD and flex sig, INR was reversed however interventions placed on hold as patient started improving somewhat. -ID consulted, discontinued vancomycin enema -Day # 7, patient has not been able to consistently eat, will place on tube feeds per GI recommendations.   Assessment & Plan    Principal Problem: Non-fulminant severe acute C. difficile colitis with ileus, abdominal pain -Presented with profuse diarrhea, generalized weakness, abdominal pain - C. difficile PCR Ag + but  toxin negative.  High suspicion for C. difficile colitis, hence treating -No significant improvement with oral vancomycin. Changed to Dificid and vancomycin enema on 8/26 - Repeat CT abdomen on 8/25 did not show any bowel obstruction, perforation or toxic megacolon -GI, ID following. GI plan for flex sig in am.  -Not consistently eating, will place on tube feeds,    Active Problems: History of ESRD status post renal transplant, 2005 -Continue mycophenolate, tacrolimus, pharmacy following -Patient is on prednisone maintenance at 5 mg daily, currently on stress dose steroids, taper down to 12.5 mg twice daily  (continue until patient able to take p.o)  Essential hypertension, sinus tachycardia -Likely due to dehydration from fluid loss with diarrhea, C. difficile colitis -Patient is now taking p.o. meds, will DC IV Cardizem drip, placed back on oral Cardizem per outpatient dose   History of DVT/PE/CVA -Coumadin was held for endoscopy, INR was reversed -cont heparin gtt, GI planning FFS in am    GERD -Continue IV Pepcid  Hypothyroidism -Continue IV Synthroid 75 MCG daily -TSH 0.5   Diabetes mellitus, type II, IDDM -Hemoglobin A1c 8.2 -CBGs controlled  Hyperlipidemia -Continue atorvastatin  Depression/anxiety -Continue sertraline  History of [name] -Declines any blood products if needed  Obesity  Estimated body mass index is 36.56 kg/m as calculated from the following:   Height as of this encounter: 5\' 4"  (1.626 m).   Weight as of this encounter: 96.6 kg.  Code Status: Full CODE STATUS DVT Prophylaxis:    Resume warfarin   Level of Care: Level of care: Progressive Family Communication: Discussed all imaging results, lab results, explained to the patient 's daughter on the phone  Disposition Plan:     Status is: Inpatient  Remains inpatient appropriate because:Inpatient level of care appropriate due to severity of illness  Dispo: The patient is from:  SNF              Anticipated d/c is to: SNF              Patient currently is not medically stable to d/c.  Until consistently tolerating p.o. diet   Difficult to place patient No   Time Spent in minutes 25 minutes  Procedures:  None  Consultants:   Gastroenterology Infectious disease  Antimicrobials:   Anti-infectives (From admission, onward)    Start     Dose/Rate Route Frequency Ordered Stop   01/30/21 2200  fidaxomicin (DIFICID) tablet 200 mg        200 mg Oral 2 times daily 01/30/21 1832 02/09/21 2159   01/30/21 2000  vancomycin (VANCOCIN) 500 mg in sodium chloride irrigation 0.9 % 100 mL ENEMA  Status:  Discontinued        500 mg Rectal Every 6 hours 01/30/21 1838 02/02/21 1152   01/27/21 2200  cefTRIAXone (ROCEPHIN) 2 g in sodium chloride 0.9 % 100 mL IVPB  Status:  Discontinued        2 g 200 mL/hr over 30 Minutes Intravenous Every 24 hours 01/27/21 0956 01/27/21 1136   01/27/21 1200  vancomycin (VANCOCIN) capsule 125 mg  Status:  Discontinued        125 mg Oral 4 times daily 01/27/21 1135 01/30/21 1832   01/27/21 1000  metroNIDAZOLE (FLAGYL) IVPB 500 mg  Status:  Discontinued        500 mg 100 mL/hr over 60 Minutes Intravenous Every 12 hours 01/27/21 0901 01/27/21 1135   01/27/21 0115  cefTRIAXone (ROCEPHIN) 2 g in sodium chloride 0.9 % 100 mL IVPB        2 g 200 mL/hr over 30 Minutes Intravenous  Once 01/27/21 0103 01/27/21 0411   01/27/21 0115  metroNIDAZOLE (FLAGYL) IVPB 500 mg  Status:  Discontinued        500 mg 100 mL/hr over 60 Minutes Intravenous Every 8 hours 01/27/21 0103 01/27/21 0901          Medications  Scheduled Meds:  (feeding supplement) PROSource Plus  30 mL Oral BID BM   donepezil  10 mg Oral QHS   feeding supplement  1 Container Oral TID BM   fidaxomicin  200 mg Oral BID   hydrocortisone sod succinate (SOLU-CORTEF) inj  25 mg Intravenous BID   insulin aspart  0-5 Units Subcutaneous QHS   insulin aspart  0-9 Units Subcutaneous TID WC    levothyroxine  75 mcg Intravenous Daily   metoCLOPramide (REGLAN) injection  10 mg Intravenous Q6H   mirtazapine  30 mg Oral QHS   multivitamin with minerals  1 tablet Oral Daily   mycophenolate  250 mg Oral BID   tacrolimus  2.5 mg Oral Q12H   Continuous Infusions:  dextrose 5 % and 0.45 % NaCl with KCl 20 mEq/L 50 mL/hr at 02/03/21 0150  diltiazem (CARDIZEM) infusion 5 mg/hr (02/02/21 1439)   famotidine (PEPCID) IV 20 mg (02/03/21 0956)   heparin 900 Units/hr (02/03/21 0945)   promethazine (PHENERGAN) injection (IM or IVPB)     PRN Meds:.acetaminophen **OR** acetaminophen, fentaNYL (SUBLIMAZE) injection, hydrALAZINE, promethazine (PHENERGAN) injection (IM or IVPB)      Subjective:   Zyonna Sooy was seen and examined today.  Started to take p.o. meds, still barely eating clears.  States diarrhea is improving, no active vomiting.  Heart rate controlled still has abdominal pain.  Afebrile   Objective:   Vitals:   02/02/21 2100 02/02/21 2150 02/03/21 0500 02/03/21 0525  BP: (!) 170/87 (!) 155/89  (!) 161/89  Pulse: 85 87  89  Resp: (!) 9 15  17   Temp:  98.5 F (36.9 C)  98.3 F (36.8 C)  TempSrc:  Oral  Oral  SpO2: 100% 100%  100%  Weight:   96.6 kg   Height:        Intake/Output Summary (Last 24 hours) at 02/03/2021 1411 Last data filed at 02/03/2021 1253 Gross per 24 hour  Intake 480 ml  Output --  Net 480 ml     Wt Readings from Last 3 Encounters:  02/03/21 96.6 kg  08/21/20 88.9 kg  09/11/19 78.9 kg    Physical Exam General: Alert and oriented x 3, NAD, uncomfortable Cardiovascular: S1 S2 clear, RRR. No pedal edema b/l Respiratory: Diminished breath sounds at bases Gastrointestinal: Soft, mild diffuse TTP, nondistended, NBS Ext: no pedal edema bilaterally Psych: Normal affect   Data Reviewed:  I have personally reviewed following labs and imaging studies  Micro Results Recent Results (from the past 240 hour(s))  Resp Panel by RT-PCR (Flu A&B,  Covid) Nasopharyngeal Swab     Status: None   Collection Time: 01/26/21  9:56 PM   Specimen: Nasopharyngeal Swab; Nasopharyngeal(NP) swabs in vial transport medium  Result Value Ref Range Status   SARS Coronavirus 2 by RT PCR NEGATIVE NEGATIVE Final    Comment: (NOTE) SARS-CoV-2 target nucleic acids are NOT DETECTED.  The SARS-CoV-2 RNA is generally detectable in upper respiratory specimens during the acute phase of infection. The lowest concentration of SARS-CoV-2 viral copies this assay can detect is 138 copies/mL. A negative result does not preclude SARS-Cov-2 infection and should not be used as the sole basis for treatment or other patient management decisions. A negative result may occur with  improper specimen collection/handling, submission of specimen other than nasopharyngeal swab, presence of viral mutation(s) within the areas targeted by this assay, and inadequate number of viral copies(<138 copies/mL). A negative result must be combined with clinical observations, patient history, and epidemiological information. The expected result is Negative.  Fact Sheet for Patients:  EntrepreneurPulse.com.au  Fact Sheet for Healthcare Providers:  IncredibleEmployment.be  This test is no t yet approved or cleared by the [place] FDA and  has been authorized for detection and/or diagnosis of SARS-CoV-2 by FDA under an Emergency Use Authorization (EUA). This EUA will remain  in effect (meaning this test can be used) for the duration of the COVID-19 declaration under Section 564(b)(1) of the Act, 21 U.S.C.section 360bbb-3(b)(1), unless the authorization is terminated  or revoked sooner.       Influenza A by PCR NEGATIVE NEGATIVE Final   Influenza B by PCR NEGATIVE NEGATIVE Final    Comment: (NOTE) The Xpert Xpress SARS-CoV-2/FLU/RSV plus assay is intended as an aid in the diagnosis of influenza from Nasopharyngeal swab specimens and should  not  be used as a sole basis for treatment. Nasal washings and aspirates are unacceptable for Xpert Xpress SARS-CoV-2/FLU/RSV testing.  Fact Sheet for Patients: EntrepreneurPulse.com.au  Fact Sheet for Healthcare Providers: IncredibleEmployment.be  This test is not yet approved or cleared by the [place] FDA and has been authorized for detection and/or diagnosis of SARS-CoV-2 by FDA under an Emergency Use Authorization (EUA). This EUA will remain in effect (meaning this test can be used) for the duration of the COVID-19 declaration under Section 564(b)(1) of the Act, 21 U.S.C. section 360bbb-3(b)(1), unless the authorization is terminated or revoked.  Performed at [facility], [place] [address], [place], [place] [address]   Blood culture (routine x 2)     Status: None   Collection Time: 01/27/21 12:32 AM   Specimen: Left Antecubital; Blood  Result Value Ref Range Status   Specimen Description   Final    LEFT ANTECUBITAL Performed at [facility] [address], [place], [place] [address]    Special Requests   Final    BOTTLES DRAWN AEROBIC AND ANAEROBIC Blood Culture adequate volume Performed at [facility] [address], [place], [place] [address]    Culture   Final    NO GROWTH 5 DAYS Performed at [facility], [place] [address] [place], [place] [address]    Report Status 02/01/2021 FINAL  Final  Blood culture (routine x 2)     Status: None   Collection Time: 01/27/21  3:21 AM   Specimen: Left Antecubital; Blood  Result Value Ref Range Status   Specimen Description   Final    LEFT ANTECUBITAL Performed at [facility] [address] [place], [place] [address]    Special Requests   Final    BOTTLES DRAWN AEROBIC AND ANAEROBIC Blood Culture results may not be optimal due to an excessive volume of blood received in culture bottles Performed at  [facility] [address] [place], [place] [address]    Culture   Final    NO GROWTH 5 DAYS Performed at [facility], [place] [address] [place], [place] [address]    Report Status 02/01/2021 FINAL  Final  Gastrointestinal Panel by PCR , Stool     Status: None   Collection Time: 01/27/21  6:33 AM   Specimen: Stool  Result Value Ref Range Status   Campylobacter species NOT DETECTED NOT DETECTED Final   Plesimonas shigelloides NOT DETECTED NOT DETECTED Final   Salmonella species NOT DETECTED NOT DETECTED Final   Yersinia enterocolitica NOT DETECTED NOT DETECTED Final   Vibrio species NOT DETECTED NOT DETECTED Final   Vibrio cholerae NOT DETECTED NOT DETECTED Final   Enteroaggregative E coli (EAEC) NOT DETECTED NOT DETECTED Final   Enteropathogenic E coli (EPEC) NOT DETECTED NOT DETECTED Final   Enterotoxigenic E coli (ETEC) NOT DETECTED NOT DETECTED Final   Shiga like toxin producing E coli (STEC) NOT DETECTED NOT DETECTED Final   Shigella/Enteroinvasive E coli (EIEC) NOT DETECTED NOT DETECTED Final   Cryptosporidium NOT DETECTED NOT DETECTED Final   Cyclospora cayetanensis NOT DETECTED NOT DETECTED Final   Entamoeba histolytica NOT DETECTED NOT DETECTED Final   Giardia lamblia NOT DETECTED NOT DETECTED Final   Adenovirus F40/41 NOT DETECTED NOT DETECTED Final   Astrovirus NOT DETECTED NOT DETECTED Final   Norovirus GI/GII NOT DETECTED NOT DETECTED Final   Rotavirus A NOT DETECTED NOT DETECTED Final   Sapovirus (I, II, IV,  and V) NOT DETECTED NOT DETECTED Final    Comment: Performed at [facility], [place], [place] [address]  C Difficile Quick Screen w PCR reflex     Status: Abnormal   Collection Time: 01/27/21  6:33 AM   Specimen: Stool  Result Value Ref Range Status   C Diff antigen POSITIVE (A) NEGATIVE Final   C Diff toxin NEGATIVE NEGATIVE Final   C Diff interpretation Results are indeterminate. See PCR results.  Final     Comment: Performed at [facility], [place] [address] [place], [place] [address]  C. Diff by PCR, Reflexed     Status: Abnormal   Collection Time: 01/27/21  6:33 AM  Result Value Ref Range Status   Toxigenic C. Difficile by PCR POSITIVE (A) NEGATIVE Final    Comment: Positive for toxigenic C. difficile with little to no toxin production. Only treat if clinical presentation suggests symptomatic illness. Performed at [facility], [place] [address], [place], [place] [address]   Culture, blood (routine x 2)     Status: None (Preliminary result)   Collection Time: 01/30/21  7:11 PM   Specimen: BLOOD  Result Value Ref Range Status   Specimen Description   Final    BLOOD BLOOD LEFT HAND Performed at [facility] [address], [place], [place] [address]    Special Requests   Final    BOTTLES DRAWN AEROBIC ONLY Blood Culture adequate volume Performed at [facility] [address], [place], [place] [address]    Culture   Final    NO GROWTH 3 DAYS Performed at [facility], [place] [address], [place], [place] [address]    Report Status PENDING  Incomplete  Culture, blood (routine x 2)     Status: None (Preliminary result)   Collection Time: 01/30/21  7:11 PM   Specimen: BLOOD  Result Value Ref Range Status   Specimen Description   Final    BLOOD LEFT WRIST Performed at [facility] [address] [place], [place] [address]    Special Requests   Final    BOTTLES DRAWN AEROBIC ONLY Blood Culture adequate volume Performed at [facility] [address] [place], [place] [address]    Culture   Final    NO GROWTH 3 DAYS Performed at [facility], [place] [address], [place],  [address]    Report Status PENDING  Incomplete    Radiology Reports CT ABDOMEN PELVIS WO CONTRAST  Result Date: 01/29/2021 CLINICAL DATA:  Bowel obstruction suspected. Abdominal pain, acute,  nonlocalized. End-stage renal disease with previous renal transplant. EXAM: CT ABDOMEN AND PELVIS WITHOUT CONTRAST TECHNIQUE: Multidetector CT imaging of the abdomen and pelvis was performed following the standard protocol without IV contrast. COMPARISON:  01/26/2021 FINDINGS: Lower chest: Elevation of the right hemidiaphragm with mild atelectasis at the right lung base, similar to the previous study. Extensive coronary artery calcification. Hepatobiliary: Previous cholecystectomy.  No focal liver lesion. Pancreas: Normal Spleen: Normal Adrenals/Urinary Tract: Adrenal glands are normal. Markedly atrophic native kidneys without significant finding. Small cysts as seen previously. Transplant kidney in the right iliac fossa without abnormal finding by CT. Bladder appears normal. Stomach/Bowel: Stomach and small intestine are normal. Diverticulosis of the colon but no evidence of diverticulitis. No bowel obstruction or focal bowel lesion. Vascular/Lymphatic: Aortic atherosclerosis. No aneurysm. IVC is normal. No retroperitoneal adenopathy. Reproductive: No pelvic mass. Other: No free fluid  or air. Musculoskeletal: No significant skeletal finding. IMPRESSION: No bowel obstruction or acute bowel pathology. Diverticulosis without evidence of diverticulitis. Elevation of the right hemidiaphragm with atelectasis at the right lung base. Atrophic native kidneys. Grossly normal appearing transplant kidney in the right iliac fossa. Aortic atherosclerosis. Widespread vascular calcification consistent with chronic renal failure. Electronically Signed   By: Nelson Chimes M.D.   On: 01/29/2021 16:10   CT ABDOMEN PELVIS WO CONTRAST  Result Date: 01/26/2021 CLINICAL DATA:  Concern for bowel obstruction. EXAM: CT ABDOMEN AND PELVIS WITHOUT CONTRAST TECHNIQUE: Multidetector CT imaging of the abdomen and pelvis was performed following the standard protocol without IV contrast. COMPARISON:  CT abdomen pelvis dated 08/21/2020. FINDINGS:  Evaluation of this exam is limited in the absence of intravenous contrast. Lower chest: Minimal bibasilar atelectasis. The visualized lung bases are otherwise clear. There is advanced 3 vessel coronary vascular calcification. No intra-abdominal free air or free fluid. Hepatobiliary: The liver is unremarkable. No intrahepatic biliary dilatation. Cholecystectomy. No retained calcified stone noted in the central CBD. Pancreas: Unremarkable. No pancreatic ductal dilatation or surrounding inflammatory changes. Spleen: Normal in size without focal abnormality. Adrenals/Urinary Tract: The adrenal glands are unremarkable. Atrophic native kidneys. Bilateral renal parenchyma nodularity similar to prior CT may represent residual renal parenchyma. A mass is less likely but not excluded. The visualized ureters appear unremarkable. The urinary bladder is collapsed. There is a right lower quadrant renal transplant. There is no hydronephrosis or nephrolithiasis of the transplant kidney. No peritransplant fluid collection. Stomach/Bowel: There is diffuse colonic diverticulosis without active inflammatory changes. There is mild thickened appearance of the jejunal folds with engorgement of the associated mesentery. Clinical correlation is recommended to evaluate for possibility of enteritis. There is no bowel obstruction. The appendix is normal. Vascular/Lymphatic: Moderate aortoiliac atherosclerotic disease. The IVC is unremarkable. No portal venous gas. There is no adenopathy. Reproductive: The uterus is grossly unremarkable. No adnexal masses. Other: Mild diffuse subcutaneous stranding.  No fluid collection. Musculoskeletal: Osteopenia with degenerative changes of the spine. Multiple old left rib fractures. No acute osseous pathology. IMPRESSION: 1. Mild thickened appearance of the jejunal folds with engorgement of the associated mesentery. Clinical correlation is recommended to evaluate for possibility of enteritis. No bowel  obstruction. Normal appendix. 2. Colonic diverticulosis. 3. Atrophic native kidneys with a right lower quadrant renal transplant. No hydronephrosis or nephrolithiasis. 4. Aortic Atherosclerosis (ICD10-I70.0). Electronically Signed   By: Anner Crete M.D.   On: 01/26/2021 20:23   DG Chest 2 View  Result Date: 01/26/2021 CLINICAL DATA:  Lower abdominal pain since Friday, tachycardia EXAM: CHEST - 2 VIEW COMPARISON:  08/28/2020 FINDINGS: Frontal and lateral views of the chest demonstrate an unremarkable cardiac silhouette. No acute airspace disease, effusion, or pneumothorax. No acute bony abnormalities. IMPRESSION: 1. No acute intrathoracic process. Electronically Signed   By: Randa Ngo M.D.   On: 01/26/2021 22:17   DG Abd 2 Views  Result Date: 01/29/2021 CLINICAL DATA:  Abdominal pain EXAM: ABDOMEN - 2 VIEW COMPARISON:  None. FINDINGS: There is mildly dilated of the cecum. Nondistended small bowel. No acute osseous abnormality. There are soft tissue calcifications consistent with subcutaneous calcification seen on prior CT. IMPRESSION: Mild dilation of the cecum. If there is concern for obstruction recommend CT. Electronically Signed   By: Maurine Simmering M.D.   On: 01/29/2021 09:25    Lab Data:  CBC: Recent Labs  Lab 01/30/21 0526 01/31/21 0713 02/01/21 0618 02/02/21 0356 02/03/21 0505  WBC 8.6 9.4 10.7* 10.0 9.1  HGB 11.1* 12.3 11.8* 12.6 11.3*  HCT 36.7 39.9 36.7 39.6 36.0  MCV 89.1 87.3 85.2 85.9 87.0  PLT 238 283 276 190 572   Basic Metabolic Panel: Recent Labs  Lab 01/28/21 0519 01/29/21 0515 01/30/21 0526 01/31/21 0713 02/01/21 0618  NA 140 141 139 140 140  K 3.7 3.6 3.3* 3.6 3.6  CL 115* 113* 112* 111 112*  CO2 21* 20* 22 19* 23  GLUCOSE 99 97 79 205* 197*  BUN 12 7 <5* 7 8  CREATININE 0.53 0.54 0.40* 0.61 0.45  CALCIUM 8.6* 8.3* 8.1* 8.6* 8.7*  MG  --  1.5* 1.1* 2.0  --    GFR: Estimated Creatinine Clearance: 84.4 mL/min (by C-G formula based on SCr of 0.45  mg/dL). Liver Function Tests: Recent Labs  Lab 01/28/21 0519 02/01/21 0618  AST 15 12*  ALT 11 10  ALKPHOS 50 47  BILITOT 0.5 0.4  PROT 5.8* 6.1*  ALBUMIN 2.9* 2.9*   Recent Labs  Lab 01/29/21 0515  LIPASE 26   No results for input(s): AMMONIA in the last 168 hours. Coagulation Profile: Recent Labs  Lab 01/31/21 1653 01/31/21 2009 02/01/21 0535 02/02/21 0356 02/03/21 0505  INR 2.3* 1.8* 1.3* 1.2 1.2   Cardiac Enzymes: No results for input(s): CKTOTAL, CKMB, CKMBINDEX, TROPONINI in the last 168 hours. BNP (last 3 results) No results for input(s): PROBNP in the last 8760 hours. HbA1C: No results for input(s): HGBA1C in the last 72 hours.  CBG: Recent Labs  Lab 02/01/21 0749 02/01/21 1205 02/01/21 1610 02/02/21 1112 02/02/21 2158  GLUCAP 177* 137* 180* 127* 151*   Lipid Profile: No results for input(s): CHOL, HDL, LDLCALC, TRIG, CHOLHDL, LDLDIRECT in the last 72 hours. Thyroid Function Tests: No results for input(s): TSH, T4TOTAL, FREET4, T3FREE, THYROIDAB in the last 72 hours.  Anemia Panel: No results for input(s): VITAMINB12, FOLATE, FERRITIN, TIBC, IRON, RETICCTPCT in the last 72 hours. Urine analysis:    Component Value Date/Time   COLORURINE YELLOW 01/26/2021 2156   APPEARANCEUR CLEAR 01/26/2021 2156   LABSPEC >1.030 (H) 01/26/2021 2156   PHURINE 6.0 01/26/2021 2156   GLUCOSEU NEGATIVE 01/26/2021 2156   GLUCOSEU NEGATIVE 10/22/2013 1621   HGBUR NEGATIVE 01/26/2021 2156   BILIRUBINUR MODERATE (A) 01/26/2021 2156   KETONESUR 40 (A) 01/26/2021 2156   PROTEINUR TRACE (A) 01/26/2021 2156   UROBILINOGEN 1.0 03/27/2015 1534   NITRITE NEGATIVE 01/26/2021 2156   LEUKOCYTESUR NEGATIVE 01/26/2021 2156     Trishelle Devora M.D. [facility] 02/03/2021, 2:11 PM  Available via Epic secure chat 7am-7pm After 7 pm, please refer to night coverage provider listed on amion.

## 2021-02-03 NOTE — Progress Notes (Addendum)
Initial Nutrition Assessment  DOCUMENTATION CODES:  Obesity unspecified  INTERVENTION:  Advance diet as medically able.  If diet does not advance within 24 hrs, recommend starting TF via small-bore NGT or starting TPN per Pharmacy.  Monitor magnesium, potassium, and phosphorus daily for at least 3 days, MD to replete as needed, as pt is at risk for refeeding syndrome given poor/limited PO over last 7 days.   Add Boost Breeze po TID, each supplement provides 250 kcal and 9 grams of protein.  Add 30 ml ProSource Plus po BID, each supplement provides 100 kcal and 15 grams of protein.   Add MVI with minerals daily.  NUTRITION DIAGNOSIS:  Increased nutrient needs related to acute illness (C. diff infection) as evidenced by estimated needs.  GOAL:  Patient will meet greater than or equal to 90% of their needs  MONITOR:  Diet advancement, PO intake, Supplement acceptance, Labs, Weight trends, I & O's  REASON FOR ASSESSMENT:  LOS, NPO/Clear Liquid Diet    ASSESSMENT:  60 yo female with a PMH of [name], ESRD, now status post renal transplant in 2005, hypothyroidism, gout, GERD, hyperlipidemia, diabetes mellitus type 2, SLE, DVT on warfarin presented from [facility] with abdominal pain and diarrhea for 5 days prior to admission. Patient reported roughly 7 episodes in a day, watery, no hematochezia or melena. Also reported generalized weakness, fatigue, no fevers or chills. C. difficile positive. CT abdomen pelvis showed enteritis. 8/25 - repeat CT showed no abdominal pathology  Per Epic, pt has had 75% of lunch yesterday.  Spoke with pt at bedside. Pt very weak and unable to speak much other than nodding, shaking her head, and blinking her eyes. She reports some nausea, but no vomiting. Nodded to feeling a bit better today.  Admit wt: 92.2 kg Current wt: 96.6 kg  Pt's weight has increased since admission, however, pt noted to have mild generalized and moderate BLE  edema. This fluid may also be masking loss on exam.  Recommend advancing diet as soon as possible, as patient cannot meet nutrition needs on clear liquid diet and it has been 7 days.   If diet cannot advance within the next 24 hours, recommend starting TPN per Pharmacy.  Medications: reviewed; Dificid BID, Solu-Cortef, SSI, Synthroid, Reglan QID, Remeron, Cellcept, tacrolimus, Pepcid BID via IV  Labs: reviewed; CBG 127-180 (H)  NUTRITION - FOCUSED PHYSICAL EXAM: Flowsheet Row Most Recent Value  Orbital Region Mild depletion  Upper Arm Region No depletion  Thoracic and Lumbar Region No depletion  Buccal Region Mild depletion  Temple Region Mild depletion  Clavicle Bone Region No depletion  Clavicle and Acromion Bone Region No depletion  Scapular Bone Region No depletion  Dorsal Hand No depletion  Patellar Region No depletion  Anterior Thigh Region No depletion  Posterior Calf Region No depletion  Edema (RD Assessment) Moderate  [Mild generalized, moderate BLE]  Hair Reviewed  Eyes Reviewed  Mouth Reviewed  Skin Reviewed  Nails Reviewed   Diet Order:   Diet Order             Diet clear liquid Room service appropriate? Yes; Fluid consistency: Thin  Diet effective now                  EDUCATION NEEDS:  Education needs have been addressed  Skin:  Skin Assessment: Reviewed RN Assessment (Cracking, ecchymosis)  Last BM:  02/02/21 - x2, Type 7, green/yellow/mucous, medium  Height:  Ht Readings from Last 1 Encounters:  02/01/21 5\' 4"  (1.626 m)   Weight:  Wt Readings from Last 1 Encounters:  02/03/21 96.6 kg   BMI:  Body mass index is 36.56 kg/m.  Estimated Nutritional Needs:  Kcal:  2000-2200 Protein:  105-120 grams Fluid:  >2 L  Derrel Nip, RD, LDN (she/her/hers) Registered Dietitian I After-Hours/Weekend Pager # in [phone]

## 2021-02-03 NOTE — Progress Notes (Signed)
Subjective: Still with diarrhea.  Objective: Vital signs in last 24 hours: Temp:  [98.3 F (36.8 C)-98.5 F (36.9 C)] 98.3 F (36.8 C) (08/30 0525) Pulse Rate:  [85-89] 89 (08/30 0525) Resp:  [9-17] 17 (08/30 0525) BP: (143-170)/(87-104) 161/89 (08/30 0525) SpO2:  [100 %] 100 % (08/30 0525) Weight:  [96.6 kg] 96.6 kg (08/30 0500) Last BM Date: 02/02/21  Intake/Output from previous day: 08/29 0701 - 08/30 0700 In: 120 [P.O.:120] Out: 350 [Urine:350] Intake/Output this shift: Total I/O In: 360 [P.O.:360] Out: -   General appearance: uncomfortable with feeding tube placement. GI: soft, non-tender; bowel sounds normal; no masses,  no organomegaly  Lab Results: Recent Labs    02/01/21 0618 02/02/21 0356 02/03/21 0505  WBC 10.7* 10.0 9.1  HGB 11.8* 12.6 11.3*  HCT 36.7 39.6 36.0  PLT 276 190 248   BMET Recent Labs    02/01/21 0618  NA 140  K 3.6  CL 112*  CO2 23  GLUCOSE 197*  BUN 8  CREATININE 0.45  CALCIUM 8.7*   LFT Recent Labs    02/01/21 0618  PROT 6.1*  ALBUMIN 2.9*  AST 12*  ALT 10  ALKPHOS 47  BILITOT 0.4   PT/INR Recent Labs    02/02/21 0356 02/03/21 0505  LABPROT 15.7* 15.6*  INR 1.2 1.2   Hepatitis Panel No results for input(s): HEPBSAG, HCVAB, HEPAIGM, HEPBIGM in the last 72 hours. C-Diff No results for input(s): CDIFFTOX in the last 72 hours. Fecal Lactopherrin No results for input(s): FECLLACTOFRN in the last 72 hours.  Studies/Results: No results found.  Medications: Scheduled:  (feeding supplement) PROSource Plus  30 mL Oral BID BM   diltiazem  360 mg Oral Daily   donepezil  10 mg Oral QHS   feeding supplement  1 Container Oral TID BM   feeding supplement (OSMOLITE 1.5 CAL)  1,000 mL Per Tube Q24H   feeding supplement (PROSource TF)  45 mL Per Tube TID   fidaxomicin  200 mg Oral BID   hydrocortisone sod succinate (SOLU-CORTEF) inj  12.5 mg Intravenous BID   insulin aspart  0-5 Units Subcutaneous QHS   insulin aspart   0-9 Units Subcutaneous TID WC   [START ON 02/04/2021] levothyroxine  150 mcg Oral Q0600   metoCLOPramide (REGLAN) injection  10 mg Intravenous Q6H   mirtazapine  30 mg Oral QHS   multivitamin with minerals  1 tablet Oral Daily   mycophenolate  250 mg Oral BID   tacrolimus  2.5 mg Oral Q12H   warfarin  6.5 mg Oral Once   [START ON 02/04/2021] Warfarin - Pharmacist Dosing Inpatient   Does not apply q1600   Continuous:  dextrose 5 % and 0.45 % NaCl with KCl 20 mEq/L 50 mL/hr at 02/03/21 0150   famotidine (PEPCID) IV 20 mg (02/03/21 0956)   heparin     promethazine (PHENERGAN) injection (IM or IVPB)      Assessment/Plan: 1) Diarrhea - ? C. Diff. 2) Malnutrition.   The patient continues to have diarrhea.  Her CMV is negative.  Her nurse reports that her diarrhea is the same as yesterday.  She is on mycophenolate and this can be a culprit for her diarrhea.  The next course of action is to perform an unprepped FFS to obtain some limited biopsies.  She is currently on heparin and she was started on coumadin today.  The INR is currently at 1.2  Plan: 1) FFS with biopsies at 3 PM tomorrow. 2) Hold  heparin 4 hours before the procedure. 3) If the biopsies are negative for any source, a trial to hold or minimize the dosing of mycophenolate will be needed.  LOS: 7 days   Connie Ruiz D 02/03/2021, 3:41 PM

## 2021-02-03 NOTE — Progress Notes (Addendum)
ANTICOAGULATION CONSULT NOTE - follow up  Pharmacy Consult for Heparin Indication: H/O DVT/PE  Patient Measurements: Height: 5\' 4"  (162.6 cm) Weight: 96.6 kg (212 lb 15.4 oz) IBW/kg (Calculated) : 54.7 Heparin Dosing Weight: 76 kg  Medications:  Warfarin PTA dosing of 6.5 mg daily  Assessment: 60 y/o F [name] on warfarin PTA for a h/o DVT/PE. Pharmacy consulted to dose heparin until patient consistently tolerating PO. Patient did receive vitamin K 2 mg iv on 8/27.   02/03/2021 PM 1405 heparin level 0.27 slightly sub- therapeutic after heparin increased to 900 from 750 units/hr No bleeding or complications reported.   CBC stable INR 1.2 today; vitamin K 2 mg IVPB given 8/27, last dose warfarin was 8/25 with 3 mg   Goal of Therapy:  Heparin level 0.3-0.7 units/ml Monitor platelets by anticoagulation protocol: Yes   Plan:  Increase Heparin to 950 units/hr Recheck level in 6 hr Continue holding warfarin today GI to do Flex sigmoidoscopy with biopsies tomorrow at 1500 and heparin will be held 4 hours prior to procedure at 11 am  Daily CBC, Heparin level & INR IV synthroid to PO per P&T policy  Eudelia Bunch, Pharm.D 02/03/2021 3:13 PM

## 2021-02-03 NOTE — Progress Notes (Addendum)
Subjective:  Still with diarrhea and nausea  Antibiotics:  Anti-infectives (From admission, onward)    Start     Dose/Rate Route Frequency Ordered Stop   01/30/21 2200  fidaxomicin (DIFICID) tablet 200 mg        200 mg Oral 2 times daily 01/30/21 1832 02/09/21 2159   01/30/21 2000  vancomycin (VANCOCIN) 500 mg in sodium chloride irrigation 0.9 % 100 mL ENEMA  Status:  Discontinued        500 mg Rectal Every 6 hours 01/30/21 1838 02/02/21 1152   01/27/21 2200  cefTRIAXone (ROCEPHIN) 2 g in sodium chloride 0.9 % 100 mL IVPB  Status:  Discontinued        2 g 200 mL/hr over 30 Minutes Intravenous Every 24 hours 01/27/21 0956 01/27/21 1136   01/27/21 1200  vancomycin (VANCOCIN) capsule 125 mg  Status:  Discontinued        125 mg Oral 4 times daily 01/27/21 1135 01/30/21 1832   01/27/21 1000  metroNIDAZOLE (FLAGYL) IVPB 500 mg  Status:  Discontinued        500 mg 100 mL/hr over 60 Minutes Intravenous Every 12 hours 01/27/21 0901 01/27/21 1135   01/27/21 0115  cefTRIAXone (ROCEPHIN) 2 g in sodium chloride 0.9 % 100 mL IVPB        2 g 200 mL/hr over 30 Minutes Intravenous  Once 01/27/21 0103 01/27/21 0411   01/27/21 0115  metroNIDAZOLE (FLAGYL) IVPB 500 mg  Status:  Discontinued        500 mg 100 mL/hr over 60 Minutes Intravenous Every 8 hours 01/27/21 0103 01/27/21 0901       Medications: Scheduled Meds:  (feeding supplement) PROSource Plus  30 mL Oral BID BM   diltiazem  360 mg Oral Daily   donepezil  10 mg Oral QHS   feeding supplement  1 Container Oral TID BM   feeding supplement (OSMOLITE 1.5 CAL)  1,000 mL Per Tube Q24H   feeding supplement (PROSource TF)  45 mL Per Tube TID   fidaxomicin  200 mg Oral BID   hydrocortisone sod succinate (SOLU-CORTEF) inj  12.5 mg Intravenous BID   insulin aspart  0-5 Units Subcutaneous QHS   insulin aspart  0-9 Units Subcutaneous TID WC   [START ON 02/04/2021] levothyroxine  150 mcg Oral Q0600   metoCLOPramide (REGLAN) injection   10 mg Intravenous Q6H   mirtazapine  30 mg Oral QHS   multivitamin with minerals  1 tablet Oral Daily   mycophenolate  250 mg Oral BID   tacrolimus  2.5 mg Oral Q12H   Continuous Infusions:  dextrose 5 % and 0.45 % NaCl with KCl 20 mEq/L 50 mL/hr at 02/03/21 0150   famotidine (PEPCID) IV 20 mg (02/03/21 0956)   heparin 950 Units/hr (02/03/21 1650)   promethazine (PHENERGAN) injection (IM or IVPB)     PRN Meds:.acetaminophen **OR** acetaminophen, fentaNYL (SUBLIMAZE) injection, hydrALAZINE, promethazine (PHENERGAN) injection (IM or IVPB)    Objective: Weight change: -1.785 kg  Intake/Output Summary (Last 24 hours) at 02/03/2021 1821 Last data filed at 02/03/2021 1700 Gross per 24 hour  Intake 480 ml  Output --  Net 480 ml   Blood pressure (!) 161/89, pulse 89, temperature 98.3 F (36.8 C), temperature source Oral, resp. rate 17, height 5\' 4"  (1.626 m), weight 96.6 kg, SpO2 100 %. Temp:  [98.3 F (36.8 C)-98.5 F (36.9 C)] 98.3 F (36.8 C) (08/30 0525) Pulse Rate:  [85-89] 89 (  08/30 0525) Resp:  [9-17] 17 (08/30 0525) BP: (143-170)/(87-104) 161/89 (08/30 0525) SpO2:  [100 %] 100 % (08/30 0525) Weight:  [96.6 kg] 96.6 kg (08/30 0500)  Physical Exam: Physical Exam Constitutional:      General: She is not in acute distress.    Appearance: She is well-developed. She is obese. She is not diaphoretic.  HENT:     Head: Normocephalic and atraumatic.     Right Ear: External ear normal.     Left Ear: External ear normal.     Mouth/Throat:     Pharynx: No oropharyngeal exudate.  Eyes:     General: No scleral icterus.    Conjunctiva/sclera: Conjunctivae normal.     Pupils: Pupils are equal, round, and reactive to light.  Cardiovascular:     Rate and Rhythm: Normal rate and regular rhythm.     Heart sounds: Murmur heard.    No friction rub. No gallop.  Pulmonary:     Effort: Pulmonary effort is normal. No respiratory distress.     Breath sounds: Normal breath sounds. No  wheezing or rales.  Abdominal:     General: Bowel sounds are normal. There is no distension.     Palpations: Abdomen is soft.     Tenderness: There is no abdominal tenderness. There is no rebound.  Musculoskeletal:        General: No tenderness. Normal range of motion.  Lymphadenopathy:     Cervical: No cervical adenopathy.  Skin:    General: Skin is warm and dry.     Coloration: Skin is not pale.     Findings: No erythema or rash.  Neurological:     General: No focal deficit present.     Mental Status: She is alert and oriented to person, place, and time.     Motor: No abnormal muscle tone.  Psychiatric:        Attention and Perception: Attention normal.        Mood and Affect: Mood is depressed.        Behavior: Behavior normal.        Thought Content: Thought content normal.        Cognition and Memory: Cognition and memory normal.        Judgment: Judgment normal.     CBC:    BMET Recent Labs    02/01/21 0618  NA 140  K 3.6  CL 112*  CO2 23  GLUCOSE 197*  BUN 8  CREATININE 0.45  CALCIUM 8.7*     Liver Panel  Recent Labs    02/01/21 0618  PROT 6.1*  ALBUMIN 2.9*  AST 12*  ALT 10  ALKPHOS 47  BILITOT 0.4       Sedimentation Rate No results for input(s): ESRSEDRATE in the last 72 hours. C-Reactive Protein No results for input(s): CRP in the last 72 hours.  Micro Results: Recent Results (from the past 720 hour(s))  Resp Panel by RT-PCR (Flu A&B, Covid) Nasopharyngeal Swab     Status: None   Collection Time: 01/26/21  9:56 PM   Specimen: Nasopharyngeal Swab; Nasopharyngeal(NP) swabs in vial transport medium  Result Value Ref Range Status   SARS Coronavirus 2 by RT PCR NEGATIVE NEGATIVE Final    Comment: (NOTE) SARS-CoV-2 target nucleic acids are NOT DETECTED.  The SARS-CoV-2 RNA is generally detectable in upper respiratory specimens during the acute phase of infection. The lowest concentration of SARS-CoV-2 viral copies this assay can  detect is 138 copies/mL. A negative  result does not preclude SARS-Cov-2 infection and should not be used as the sole basis for treatment or other patient management decisions. A negative result may occur with  improper specimen collection/handling, submission of specimen other than nasopharyngeal swab, presence of viral mutation(s) within the areas targeted by this assay, and inadequate number of viral copies(<138 copies/mL). A negative result must be combined with clinical observations, patient history, and epidemiological information. The expected result is Negative.  Fact Sheet for Patients:  EntrepreneurPulse.com.au  Fact Sheet for Healthcare Providers:  IncredibleEmployment.be  This test is no t yet approved or cleared by the [place] FDA and  has been authorized for detection and/or diagnosis of SARS-CoV-2 by FDA under an Emergency Use Authorization (EUA). This EUA will remain  in effect (meaning this test can be used) for the duration of the COVID-19 declaration under Section 564(b)(1) of the Act, 21 U.S.C.section 360bbb-3(b)(1), unless the authorization is terminated  or revoked sooner.       Influenza A by PCR NEGATIVE NEGATIVE Final   Influenza B by PCR NEGATIVE NEGATIVE Final    Comment: (NOTE) The Xpert Xpress SARS-CoV-2/FLU/RSV plus assay is intended as an aid in the diagnosis of influenza from Nasopharyngeal swab specimens and should not be used as a sole basis for treatment. Nasal washings and aspirates are unacceptable for Xpert Xpress SARS-CoV-2/FLU/RSV testing.  Fact Sheet for Patients: EntrepreneurPulse.com.au  Fact Sheet for Healthcare Providers: IncredibleEmployment.be  This test is not yet approved or cleared by the [place] FDA and has been authorized for detection and/or diagnosis of SARS-CoV-2 by FDA under an Emergency Use Authorization (EUA). This EUA will remain in  effect (meaning this test can be used) for the duration of the COVID-19 declaration under Section 564(b)(1) of the Act, 21 U.S.C. section 360bbb-3(b)(1), unless the authorization is terminated or revoked.  Performed at [facility], [place] [address], [place], [place] [address]   Blood culture (routine x 2)     Status: None   Collection Time: 01/27/21 12:32 AM   Specimen: Left Antecubital; Blood  Result Value Ref Range Status   Specimen Description   Final    LEFT ANTECUBITAL Performed at [place] [facility] [address], [place], [place] [address]    Special Requests   Final    BOTTLES DRAWN AEROBIC AND ANAEROBIC Blood Culture adequate volume Performed at [facility] [address] [place], [place] [address]    Culture   Final    NO GROWTH 5 DAYS Performed at [facility], [place] [address] [place], [place] [address]    Report Status 02/01/2021 FINAL  Final  Blood culture (routine x 2)     Status: None   Collection Time: 01/27/21  3:21 AM   Specimen: Left Antecubital; Blood  Result Value Ref Range Status   Specimen Description   Final    LEFT ANTECUBITAL Performed at [facility] [address], [place], [place] [address]    Special Requests   Final    BOTTLES DRAWN AEROBIC AND ANAEROBIC Blood Culture results may not be optimal due to an excessive volume of blood received in culture bottles Performed at [facility] [address] [place], [place] [address]    Culture   Final    NO GROWTH 5 DAYS Performed at [facility], [place] [address], [place], [place] [address]    Report Status 02/01/2021 FINAL  Final  Gastrointestinal Panel by PCR ,  Stool     Status: None   Collection Time: 01/27/21  6:33 AM   Specimen: Stool  Result Value Ref Range Status   Campylobacter species NOT DETECTED NOT DETECTED Final   Plesimonas shigelloides NOT DETECTED NOT DETECTED Final    Salmonella species NOT DETECTED NOT DETECTED Final   Yersinia enterocolitica NOT DETECTED NOT DETECTED Final   Vibrio species NOT DETECTED NOT DETECTED Final   Vibrio cholerae NOT DETECTED NOT DETECTED Final   Enteroaggregative E coli (EAEC) NOT DETECTED NOT DETECTED Final   Enteropathogenic E coli (EPEC) NOT DETECTED NOT DETECTED Final   Enterotoxigenic E coli (ETEC) NOT DETECTED NOT DETECTED Final   Shiga like toxin producing E coli (STEC) NOT DETECTED NOT DETECTED Final   Shigella/Enteroinvasive E coli (EIEC) NOT DETECTED NOT DETECTED Final   Cryptosporidium NOT DETECTED NOT DETECTED Final   Cyclospora cayetanensis NOT DETECTED NOT DETECTED Final   Entamoeba histolytica NOT DETECTED NOT DETECTED Final   Giardia lamblia NOT DETECTED NOT DETECTED Final   Adenovirus F40/41 NOT DETECTED NOT DETECTED Final   Astrovirus NOT DETECTED NOT DETECTED Final   Norovirus GI/GII NOT DETECTED NOT DETECTED Final   Rotavirus A NOT DETECTED NOT DETECTED Final   Sapovirus (I, II, IV, and V) NOT DETECTED NOT DETECTED Final    Comment: Performed at [facility], [address] [place], [place] [address]  C Difficile Quick Screen w PCR reflex     Status: Abnormal   Collection Time: 01/27/21  6:33 AM   Specimen: Stool  Result Value Ref Range Status   C Diff antigen POSITIVE (A) NEGATIVE Final   C Diff toxin NEGATIVE NEGATIVE Final   C Diff interpretation Results are indeterminate. See PCR results.  Final    Comment: Performed at [facility], [place] [address] [place], [place] [address]  C. Diff by PCR, Reflexed     Status: Abnormal   Collection Time: 01/27/21  6:33 AM  Result Value Ref Range Status   Toxigenic C. Difficile by PCR POSITIVE (A) NEGATIVE Final    Comment: Positive for toxigenic C. difficile with little to no toxin production. Only treat if clinical presentation suggests symptomatic illness. Performed at [facility], [place] [address] [place], [place]  [address]   Culture, blood (routine x 2)     Status: None (Preliminary result)   Collection Time: 01/30/21  7:11 PM   Specimen: BLOOD  Result Value Ref Range Status   Specimen Description   Final    BLOOD BLOOD LEFT HAND Performed at [facility] [address] [place], [place] [address]    Special Requests   Final    BOTTLES DRAWN AEROBIC ONLY Blood Culture adequate volume Performed at [facility] [address] [place], [place] [address]    Culture   Final    NO GROWTH 3 DAYS Performed at [facility], [place] [address], [place], [place] [address]    Report Status PENDING  Incomplete  Culture, blood (routine x 2)     Status: None (Preliminary result)   Collection Time: 01/30/21  7:11 PM   Specimen: BLOOD  Result Value Ref Range Status   Specimen Description   Final    BLOOD LEFT WRIST Performed at [facility] [address], [place], [place] [address]    Special Requests   Final    BOTTLES DRAWN AEROBIC ONLY Blood Culture adequate volume Performed at [facility],  [place] [address], [place], [place] [address]    Culture   Final    NO GROWTH 3 DAYS Performed at [facility], [place] [address], [place],  [address]    Report Status PENDING  Incomplete    Studies/Results: DG Abd Portable 1V  Result Date: 02/03/2021 CLINICAL DATA:  Nasogastric tube placement. EXAM: PORTABLE ABDOMEN - 1 VIEW COMPARISON:  X-ray abdomen 01/29/2021, CT abdomen pelvis 01/29/2021 FINDINGS: Enteric tube with tip overlying the expected region of the pyloric region/first portion of the duodenum. The bowel gas pattern is normal. Status post cholecystectomy. No radio-opaque calculi or other significant radiographic abnormality are seen. Aortic atherosclerosis. IMPRESSION: 1. Enteric tube with tip overlying the expected region of the pyloric region/first portion of the duodenum. 2. Aortic Atherosclerosis (ICD10-I70.0).  Electronically Signed   By: Iven Finn M.D.   On: 02/03/2021 16:22   ECHOCARDIOGRAM COMPLETE  Result Date: 02/03/2021    ECHOCARDIOGRAM REPORT   Patient Name:   ALEAH AHLGRIM Date of Exam: 02/03/2021 Medical Rec #:  [id number]    Height:       64.0 in Accession #:    [id number]   Weight:       213.0 lb Date of Birth:  [birth year]    BSA:          2.009 m Patient Age:    35 years     BP:           163/66 mmHg Patient Gender: F            HR:           89 bpm. Exam Location:  Inpatient Procedure: 2D Echo, Cardiac Doppler and Color Doppler Indications:    R01.1 Murmur  History:        Patient has prior history of Echocardiogram examinations, most                 recent 08/14/2018. CHF, Arrythmias:Tachycardia; Risk                 Factors:Hypertension, Diabetes and Dyslipidemia.  Sonographer:    MH Referring Phys: [place]  1. Left ventricular ejection fraction, by estimation, is 65 to 70%. The left ventricle has normal function. The left ventricle has no regional wall motion abnormalities. There is mild concentric left ventricular hypertrophy. Left ventricular diastolic parameters are consistent with Grade I diastolic dysfunction (impaired relaxation).  2. Right ventricular systolic function is normal. The right ventricular size is normal.  3. The mitral valve is normal in structure. Mild mitral valve regurgitation. No evidence of mitral stenosis.  4. The aortic valve is normal in structure. Aortic valve regurgitation is not visualized. Mild aortic valve sclerosis is present, with no evidence of aortic valve stenosis.  5. The inferior vena cava is normal in size with <50% respiratory variability, suggesting right atrial pressure of 8 mmHg. FINDINGS  Left Ventricle: Left ventricular ejection fraction, by estimation, is 65 to 70%. The left ventricle has normal function. The left ventricle has no regional wall motion abnormalities. The left ventricular internal cavity size was normal in  size. There is  mild concentric left ventricular hypertrophy. Left ventricular diastolic parameters are consistent with Grade I diastolic dysfunction (impaired relaxation). Indeterminate filling pressures. Right Ventricle: The right ventricular size is normal. No increase in right ventricular wall thickness. Right ventricular systolic function is normal. Left Atrium: Left atrial size was normal in size. Right Atrium: Right atrial size was normal  in size. Pericardium: There is no evidence of pericardial effusion. Mitral Valve: The mitral valve is normal in structure. Mild to moderate mitral annular calcification. Mild mitral valve regurgitation. No evidence of mitral valve stenosis. Tricuspid Valve: The tricuspid valve is normal in structure. Tricuspid valve regurgitation is not demonstrated. No evidence of tricuspid stenosis. Aortic Valve: The aortic valve is normal in structure. Aortic valve regurgitation is not visualized. Mild aortic valve sclerosis is present, with no evidence of aortic valve stenosis. Aortic valve mean gradient measures 4.0 mmHg. Aortic valve peak gradient measures 7.4 mmHg. Aortic valve area, by VTI measures 3.13 cm. Pulmonic Valve: The pulmonic valve was normal in structure. Pulmonic valve regurgitation is not visualized. No evidence of pulmonic stenosis. Aorta: The aortic root is normal in size and structure. Venous: The inferior vena cava is normal in size with less than 50% respiratory variability, suggesting right atrial pressure of 8 mmHg. IAS/Shunts: No atrial level shunt detected by color flow Doppler.  LEFT VENTRICLE PLAX 2D LVIDd:         4.00 cm     Diastology LVIDs:         2.70 cm     LV e' medial:    4.57 cm/s LV PW:         1.30 cm     LV E/e' medial:  14.7 LV IVS:        1.70 cm     LV e' lateral:   5.00 cm/s LVOT diam:     2.20 cm     LV E/e' lateral: 13.5 LV SV:         68 LV SV Index:   34 LVOT Area:     3.80 cm  LV Volumes (MOD) LV vol d, MOD A4C: 26.9 ml LV vol s, MOD  A4C: 12.0 ml LV SV MOD A4C:     26.9 ml RIGHT VENTRICLE             IVC RV S prime:     10.80 cm/s  IVC diam: 1.70 cm TAPSE (M-mode): 1.6 cm LEFT ATRIUM           Index       RIGHT ATRIUM          Index LA diam:      2.00 cm 1.00 cm/m  RA Area:     7.00 cm LA Vol (A2C): 36.3 ml 18.07 ml/m RA Volume:   12.40 ml 6.17 ml/m LA Vol (A4C): 14.0 ml 6.97 ml/m  AORTIC VALVE AV Area (Vmax):    3.21 cm AV Area (Vmean):   2.97 cm AV Area (VTI):     3.13 cm AV Vmax:           136.00 cm/s AV Vmean:          93.700 cm/s AV VTI:            0.216 m AV Peak Grad:      7.4 mmHg AV Mean Grad:      4.0 mmHg LVOT Vmax:         115.00 cm/s LVOT Vmean:        73.300 cm/s LVOT VTI:          0.178 m LVOT/AV VTI ratio: 0.82  AORTA Ao Root diam: 3.10 cm Ao Asc diam:  2.90 cm MITRAL VALVE MV Area (PHT): 3.67 cm     SHUNTS MV E velocity: 67.28 cm/s   Systemic VTI:  0.18 m MV A velocity: 158.00  cm/s  Systemic Diam: 2.20 cm MV E/A ratio:  0.43 Mihai Croitoru MD Electronically signed by Sanda Klein MD Signature Date/Time: 02/03/2021/4:54:42 PM    Final       Assessment/Plan:  INTERVAL HISTORY: Diarrhea persists   Principal Problem:   Enteritis Active Problems:   Essential hypertension   Chronic diastolic congestive heart failure (HCC)   LUPUS   KIDNEY TRANSPLANTATION, HX OF   DVT (deep venous thrombosis) (HCC)   Hyperlipidemia   C. difficile colitis   Type 2 diabetes mellitus (HCC)   Sinus tachycardia   SLE (systemic lupus erythematosus related syndrome) ([place])   Hypothyroidism   Deceased-donor kidney transplant recipient    Connie Ruiz is a 60 y.o. female with  hx of renal transplant admitted with nausea and diarrhea. C diff testing was not convincing for C difficile infection nor was imaging or response to therapy. Her CMV in peripheral blood is negative    #1 Diarrhea:   BC with WBC 9.1, platetets 248  Not clear that she has an ID cause  We will followup on endoscopy  We have continued dificid but  if endoscopy not c/w this would DC it  She could still have CMV colitis without CMV viremia  Dr. Benson Norway suspects possible medication toxicity from ex mycophenolate  #2 Renal transplant recipient: renal fxn normal creatine today 0.45  #3  Murmur: 2D echocardiogram shows some mild mild mitral valve regurgitation.      LOS: 7 days   Alcide Evener 02/03/2021, 6:21 PM

## 2021-02-03 NOTE — Progress Notes (Signed)
ANTICOAGULATION CONSULT NOTE - follow up  Pharmacy Consult for Heparin  Indication: H/O DVT/PE  Patient Measurements: Height: 5\' 4"  (162.6 cm) Weight: 96.6 kg (212 lb 15.4 oz) IBW/kg (Calculated) : 54.7 Heparin Dosing Weight: 76 kg  Medications:  Warfarin PTA dosing of 6.5 mg daily  Assessment: 60 y/o F [name] on warfarin PTA for a h/o DVT/PE. Pharmacy consulted to dose heparin until patient consistently tolerating PO. Patient did receive vitamin K 2 mg iv on 8/27.   Previous Heparin levels 8/29 were 0.84, 0.86 > rate reduced to 750 units/hr > 0.34  02/03/2021 0505 Heparin level 0.23,  now sub- therapeutic on heparin 750 units/hr No bleeding or complications reported.    Goal of Therapy:  Heparin level 0.3-0.7 units/ml Monitor platelets by anticoagulation protocol: Yes   Plan:  Increase Heparin back to 900 units/hr Recheck level in 6 hr Daily CBC, Heparin level   Connie Ruiz PharmD [facility] [phone] 02/03/2021, 6:59 AM

## 2021-02-04 ENCOUNTER — Inpatient Hospital Stay (HOSPITAL_COMMUNITY): Payer: Medicare Other | Admitting: Certified Registered Nurse Anesthetist

## 2021-02-04 ENCOUNTER — Encounter (HOSPITAL_COMMUNITY)
Admission: EM | Disposition: A | Discharge status: Skilled Nursing Facility | Payer: Self-pay | Source: Skilled Nursing Facility | Attending: Internal Medicine

## 2021-02-04 DIAGNOSIS — I10 Essential (primary) hypertension: Secondary | ICD-10-CM

## 2021-02-04 DIAGNOSIS — I5032 Chronic diastolic (congestive) heart failure: Secondary | ICD-10-CM | POA: Diagnosis not present

## 2021-02-04 DIAGNOSIS — I825Z9 Chronic embolism and thrombosis of unspecified deep veins of unspecified distal lower extremity: Secondary | ICD-10-CM | POA: Diagnosis not present

## 2021-02-04 DIAGNOSIS — K529 Noninfective gastroenteritis and colitis, unspecified: Secondary | ICD-10-CM | POA: Diagnosis not present

## 2021-02-04 DIAGNOSIS — A0472 Enterocolitis due to Clostridium difficile, not specified as recurrent: Secondary | ICD-10-CM | POA: Diagnosis not present

## 2021-02-04 HISTORY — PX: FLEXIBLE SIGMOIDOSCOPY: SHX5431

## 2021-02-04 HISTORY — PX: BIOPSY: SHX5522

## 2021-02-04 LAB — POCT I-STAT, CHEM 8
BUN: <3 mg/dL — ABNORMAL LOW (ref 6–20)
Calcium, Ion: 1.05 mmol/L — ABNORMAL LOW (ref 1.15–1.40)
Chloride: 101 mmol/L (ref 98–111)
Creatinine, Ser: 0.4 mg/dL — ABNORMAL LOW (ref 0.44–1.00)
Glucose, Bld: 172 mg/dL — ABNORMAL HIGH (ref 70–99)
HCT: 37 % (ref 36.0–46.0)
Hemoglobin: 12.6 g/dL (ref 12.0–15.0)
Potassium: 3.2 mmol/L — ABNORMAL LOW (ref 3.5–5.1)
Sodium: 138 mmol/L (ref 135–145)
TCO2: 28 mmol/L (ref 22–32)

## 2021-02-04 LAB — CBC
HCT: 36.9 % (ref 36.0–46.0)
Hemoglobin: 11.8 g/dL — ABNORMAL LOW (ref 12.0–15.0)
MCH: 27.1 pg (ref 26.0–34.0)
MCHC: 32 g/dL (ref 30.0–36.0)
MCV: 84.8 fL (ref 80.0–100.0)
Platelets: 262 10*3/uL (ref 150–400)
RBC: 4.35 MIL/uL (ref 3.87–5.11)
RDW: 15.6 % — ABNORMAL HIGH (ref 11.5–15.5)
WBC: 12.5 10*3/uL — ABNORMAL HIGH (ref 4.0–10.5)
nRBC: 0 % (ref 0.0–0.2)

## 2021-02-04 LAB — GLUCOSE, CAPILLARY
Glucose-Capillary: 150 mg/dL — ABNORMAL HIGH (ref 70–99)
Glucose-Capillary: 171 mg/dL — ABNORMAL HIGH (ref 70–99)
Glucose-Capillary: 166 mg/dL — ABNORMAL HIGH (ref 70–99)
Glucose-Capillary: 172 mg/dL — ABNORMAL HIGH (ref 70–99)
Glucose-Capillary: 166 mg/dL — ABNORMAL HIGH (ref 70–99)
Glucose-Capillary: 125 mg/dL — ABNORMAL HIGH (ref 70–99)
Glucose-Capillary: 176 mg/dL — ABNORMAL HIGH (ref 70–99)
Glucose-Capillary: 144 mg/dL — ABNORMAL HIGH (ref 70–99)
Glucose-Capillary: 120 mg/dL — ABNORMAL HIGH (ref 70–99)
Glucose-Capillary: 167 mg/dL — ABNORMAL HIGH (ref 70–99)
Glucose-Capillary: 180 mg/dL — ABNORMAL HIGH (ref 70–99)

## 2021-02-04 LAB — BASIC METABOLIC PANEL
Anion gap: 5 (ref 5–15)
BUN: <5 mg/dL — ABNORMAL LOW (ref 6–20)
CO2: 30 mmol/L (ref 22–32)
Calcium: 8.9 mg/dL (ref 8.9–10.3)
Chloride: 107 mmol/L (ref 98–111)
Creatinine, Ser: 0.44 mg/dL (ref 0.44–1.00)
GFR, Estimated: >60 mL/min (ref >60)
Glucose, Bld: 170 mg/dL — ABNORMAL HIGH (ref 70–99)
Potassium: 2.7 mmol/L — CL (ref 3.5–5.1)
Sodium: 142 mmol/L (ref 135–145)

## 2021-02-04 LAB — HM SIGMOIDOSCOPY

## 2021-02-04 LAB — HEPARIN LEVEL (UNFRACTIONATED)
Heparin Unfractionated: 0.49 IU/mL (ref 0.30–0.70)
Heparin Unfractionated: 0.39 IU/mL (ref 0.30–0.70)

## 2021-02-04 LAB — MAGNESIUM: Magnesium: 1.2 mg/dL — ABNORMAL LOW (ref 1.7–2.4)

## 2021-02-04 LAB — PHOSPHORUS: Phosphorus: 2.2 mg/dL — ABNORMAL LOW (ref 2.5–4.6)

## 2021-02-04 LAB — PROTIME-INR
INR: 1.2 (ref 0.8–1.2)
Prothrombin Time: 14.9 seconds (ref 11.4–15.2)

## 2021-02-04 SURGERY — SIGMOIDOSCOPY, FLEXIBLE
Anesthesia: Monitor Anesthesia Care

## 2021-02-04 MED ORDER — POTASSIUM CHLORIDE 10 MEQ/100ML IV SOLN
10.0000 meq | INTRAVENOUS | Status: AC
Start: 2021-02-04 — End: 2021-02-04
  Administered 2021-02-04 (×6): 10 meq via INTRAVENOUS
  Filled 2021-02-04 (×4): qty 100

## 2021-02-04 MED ORDER — WARFARIN SODIUM 6 MG PO TABS
6.0000 mg | ORAL_TABLET | Freq: Once | ORAL | Status: AC
  Administered 2021-02-04: 6 mg via ORAL
  Filled 2021-02-04: qty 1

## 2021-02-04 MED ORDER — WARFARIN - PHARMACIST DOSING INPATIENT: Freq: Every day | Status: DC

## 2021-02-04 MED ORDER — MAGNESIUM SULFATE 4 GM/100ML IV SOLN
4.0000 g | Freq: Once | INTRAVENOUS | Status: AC
  Administered 2021-02-04: 4 g via INTRAVENOUS
  Filled 2021-02-04: qty 100

## 2021-02-04 MED ORDER — LIDOCAINE 2% (20 MG/ML) 5 ML SYRINGE
INTRAMUSCULAR | Status: DC | PRN
  Administered 2021-02-04: 40 mg via INTRAVENOUS

## 2021-02-04 MED ORDER — SODIUM CHLORIDE 0.9 % IV SOLN: INTRAVENOUS | Status: DC | PRN

## 2021-02-04 MED ORDER — HEPARIN (PORCINE) 25000 UT/250ML-% IV SOLN
1250.0000 [IU]/h | INTRAVENOUS | Status: DC
  Administered 2021-02-04: 950 [IU]/h via INTRAVENOUS
  Administered 2021-02-05: 1000 [IU]/h via INTRAVENOUS
  Administered 2021-02-06: 1150 [IU]/h via INTRAVENOUS
  Filled 2021-02-04 (×3): qty 250

## 2021-02-04 MED ORDER — PROPOFOL 1000 MG/100ML IV EMUL
INTRAVENOUS | Status: AC
  Filled 2021-02-04: qty 100

## 2021-02-04 MED ORDER — PROPOFOL 10 MG/ML IV BOLUS
INTRAVENOUS | Status: DC | PRN
  Administered 2021-02-04 (×3): 10 mg via INTRAVENOUS

## 2021-02-04 MED ORDER — DEXTROSE-NACL 5-0.45 % IV SOLN: INTRAVENOUS | Status: DC

## 2021-02-04 MED ORDER — PROPOFOL 500 MG/50ML IV EMUL
INTRAVENOUS | Status: DC | PRN
  Administered 2021-02-04: 75 ug/kg/min via INTRAVENOUS

## 2021-02-04 MED ORDER — POTASSIUM CHLORIDE 10 MEQ/100ML IV SOLN: 10.0000 meq | INTRAVENOUS | Status: DC

## 2021-02-04 NOTE — Progress Notes (Signed)
[facility]                                                                              Patient Demographics  Connie Ruiz, is a 60 y.o. female, DOB - [birth year], QPY:[id number]  Admit date - 01/26/2021   Admitting Physician Donnamarie Poag [place] DO  Outpatient Primary MD for the patient is [name], [name], MD  Outpatient specialists:   LOS - 8  days   Medical records reviewed and are as summarized below:    Chief Complaint  Patient presents with   Abdominal Pain       Brief summary   Patient is 60 year old female, [name], ESRD, now status post renal transplant in 2005, hypothyroidism, gout, GERD, hyperlipidemia, diabetes mellitus type 2, SLE, DVT on warfarin presented from [facility] with abdominal pain and diarrhea for 5 days prior to admission.  Patient reported roughly 7 episodes in a day, watery, no hematochezia or melena.  Also reported generalized weakness, fatigue, no fevers or chills. C. difficile positive.  CT abdomen pelvis showed enteritis.  interval summary 8/24- 8/30 Patient was admitted with high suspicion for acute C. difficile colitis.  Initially treated with oral vancomycin with no significant improvement.  Continues to have nausea vomiting abdominal tenderness and diarrhea. -On 8/26, changed to Dificid and vancomycin enemas -GI has been following, as patient has persistent nausea, abdominal pain, abdominal tenderness and diarrhea.  Initial plan to pursue EGD and flex sig, INR was reversed however interventions placed on hold as patient started improving somewhat. -ID consulted, discontinued vancomycin enema -Day # 7, patient has not been able to consistently eat, will place on tube feeds per GI recommendations.   Assessment & Plan    Principal Problem: Non-fulminant severe acute C. difficile colitis with ileus, abdominal pain -Presented with profuse diarrhea, generalized weakness, abdominal pain - C. difficile PCR Ag + but  toxin negative.  High suspicion for C. difficile colitis, hence treating -No significant improvement with oral vancomycin. Changed to Dificid on 8/26 - Repeat CT abdomen on 8/25 did not show any bowel obstruction, perforation or toxic megacolon -GI, ID following. GI plan for flex sig today -Not consistently eating, continue on tube feeds for now  Active Problems: History of ESRD status post renal transplant, 2005 -Continue mycophenolate, tacrolimus, pharmacy following.  Tacrolimus level in therapeutic range. -Patient is on prednisone maintenance at 5 mg daily, currently on stress dose steroids, taper down to 12.5 mg twice daily  (continue until patient able to take p.o)  Essential hypertension, sinus tachycardia -Likely due to dehydration from fluid loss with diarrhea, C. difficile colitis -Patient is now taking p.o. meds, will DC IV Cardizem drip, placed back on oral Cardizem per outpatient dose   History of DVT/PE/CVA -Coumadin was held for endoscopy, INR was reversed -cont heparin gtt, GI planning FFS today   GERD -Continue IV Pepcid  Hypothyroidism -Continue IV Synthroid 75 MCG daily, transition to p.o. when able -TSH 0.5   Diabetes mellitus, type II, IDDM -Hemoglobin A1c 8.2 -CBGs controlled  Hyperlipidemia -Continue atorvastatin  Depression/anxiety -Continue sertraline  History of [name] -Declines any blood products if  needed  Obesity Estimated body mass index is 36.56 kg/m as calculated from the following:   Height as of this encounter: 5\' 4"  (1.626 m).   Weight as of this encounter: 96.6 kg.  Code Status: Full CODE STATUS DVT Prophylaxis:    Resume warfarin/heparin bridge   Level of Care: Level of care: Progressive Family Communication: Discussed all imaging results, lab results, explained to the patient 's daughter on the phone  Disposition Plan:     Status is: Inpatient  Remains inpatient appropriate because:Inpatient level of care  appropriate due to severity of illness  Dispo: The patient is from: SNF              Anticipated d/c is to: SNF              Patient currently is not medically stable to d/c.  Until consistently tolerating p.o. diet   Difficult to place patient No   Time Spent in minutes 25 minutes  Procedures:  None  Consultants:   Gastroenterology Infectious disease  Antimicrobials:   Anti-infectives (From admission, onward)    Start     Dose/Rate Route Frequency Ordered Stop   01/30/21 2200  fidaxomicin (DIFICID) tablet 200 mg        200 mg Oral 2 times daily 01/30/21 1832 02/09/21 2159   01/30/21 2000  vancomycin (VANCOCIN) 500 mg in sodium chloride irrigation 0.9 % 100 mL ENEMA  Status:  Discontinued        500 mg Rectal Every 6 hours 01/30/21 1838 02/02/21 1152   01/27/21 2200  cefTRIAXone (ROCEPHIN) 2 g in sodium chloride 0.9 % 100 mL IVPB  Status:  Discontinued        2 g 200 mL/hr over 30 Minutes Intravenous Every 24 hours 01/27/21 0956 01/27/21 1136   01/27/21 1200  vancomycin (VANCOCIN) capsule 125 mg  Status:  Discontinued        125 mg Oral 4 times daily 01/27/21 1135 01/30/21 1832   01/27/21 1000  metroNIDAZOLE (FLAGYL) IVPB 500 mg  Status:  Discontinued        500 mg 100 mL/hr over 60 Minutes Intravenous Every 12 hours 01/27/21 0901 01/27/21 1135   01/27/21 0115  cefTRIAXone (ROCEPHIN) 2 g in sodium chloride 0.9 % 100 mL IVPB        2 g 200 mL/hr over 30 Minutes Intravenous  Once 01/27/21 0103 01/27/21 0411   01/27/21 0115  metroNIDAZOLE (FLAGYL) IVPB 500 mg  Status:  Discontinued        500 mg 100 mL/hr over 60 Minutes Intravenous Every 8 hours 01/27/21 0103 01/27/21 0901          Medications  Scheduled Meds:  diltiazem  360 mg Oral Daily   donepezil  10 mg Oral QHS   feeding supplement (OSMOLITE 1.5 CAL)  1,000 mL Per Tube Q24H   feeding supplement (PROSource TF)  45 mL Per Tube TID   fidaxomicin  200 mg Oral BID   hydrocortisone sod succinate (SOLU-CORTEF) inj   12.5 mg Intravenous BID   insulin aspart  0-5 Units Subcutaneous QHS   insulin aspart  0-9 Units Subcutaneous TID WC   levothyroxine  150 mcg Oral Q0600   metoCLOPramide (REGLAN) injection  10 mg Intravenous Q6H   mirtazapine  30 mg Oral QHS   multivitamin with minerals  1 tablet Oral Daily   mycophenolate  250 mg Oral BID   tacrolimus  2.5 mg Oral Q12H   Continuous Infusions:  dextrose  5 % and 0.45% NaCl 50 mL/hr at 02/04/21 0655   famotidine (PEPCID) IV 20 mg (02/04/21 1027)   heparin 950 Units/hr (02/04/21 1719)   promethazine (PHENERGAN) injection (IM or IVPB) 25 mg (02/03/21 2239)   PRN Meds:.acetaminophen **OR** acetaminophen, fentaNYL (SUBLIMAZE) injection, hydrALAZINE, promethazine (PHENERGAN) injection (IM or IVPB)      Subjective:   Connie Ruiz was seen and examined today.  She reports continued pain in her abdomen.  She reports continued loose stools.  P.o. intake remains poor.   Objective:   Vitals:   02/04/21 1430 02/04/21 1529 02/04/21 1539 02/04/21 1549  BP: (!) 173/82 (!) 156/96 (!) 165/90 (!) 171/90  Pulse:  (!) 109 (!) 106 (!) 102  Resp:  13 13 13   Temp:  99 F (37.2 C)    TempSrc:  Oral    SpO2:  100% 97% 97%  Weight:      Height:        Intake/Output Summary (Last 24 hours) at 02/04/2021 1812 Last data filed at 02/04/2021 1518 Gross per 24 hour  Intake 1973.23 ml  Output 600 ml  Net 1373.23 ml     Wt Readings from Last 3 Encounters:  02/03/21 96.6 kg  08/21/20 88.9 kg  09/11/19 78.9 kg    Physical Exam General exam: Alert, awake, oriented x 3 Respiratory system: Clear to auscultation. Respiratory effort normal. Cardiovascular system:RRR. No murmurs, rubs, gallops. Gastrointestinal system: Abdomen is nondistended, soft and diffusely tender to palpation. No organomegaly or masses felt. Normal bowel sounds heard. Central nervous system: Alert and oriented. No focal neurological deficits. Extremities: No C/C/E, +pedal pulses Skin: No  rashes, lesions or ulcers Psychiatry: Judgement and insight appear normal. Mood & affect appropriate.    Data Reviewed:  I have personally reviewed following labs and imaging studies  Micro Results Recent Results (from the past 240 hour(s))  Resp Panel by RT-PCR (Flu A&B, Covid) Nasopharyngeal Swab     Status: None   Collection Time: 01/26/21  9:56 PM   Specimen: Nasopharyngeal Swab; Nasopharyngeal(NP) swabs in vial transport medium  Result Value Ref Range Status   SARS Coronavirus 2 by RT PCR NEGATIVE NEGATIVE Final    Comment: (NOTE) SARS-CoV-2 target nucleic acids are NOT DETECTED.  The SARS-CoV-2 RNA is generally detectable in upper respiratory specimens during the acute phase of infection. The lowest concentration of SARS-CoV-2 viral copies this assay can detect is 138 copies/mL. A negative result does not preclude SARS-Cov-2 infection and should not be used as the sole basis for treatment or other patient management decisions. A negative result may occur with  improper specimen collection/handling, submission of specimen other than nasopharyngeal swab, presence of viral mutation(s) within the areas targeted by this assay, and inadequate number of viral copies(<138 copies/mL). A negative result must be combined with clinical observations, patient history, and epidemiological information. The expected result is Negative.  Fact Sheet for Patients:  EntrepreneurPulse.com.au  Fact Sheet for Healthcare Providers:  IncredibleEmployment.be  This test is no t yet approved or cleared by the [place] FDA and  has been authorized for detection and/or diagnosis of SARS-CoV-2 by FDA under an Emergency Use Authorization (EUA). This EUA will remain  in effect (meaning this test can be used) for the duration of the COVID-19 declaration under Section 564(b)(1) of the Act, 21 U.S.C.section 360bbb-3(b)(1), unless the authorization is terminated  or  revoked sooner.       Influenza A by PCR NEGATIVE NEGATIVE Final   Influenza B by PCR  NEGATIVE NEGATIVE Final    Comment: (NOTE) The Xpert Xpress SARS-CoV-2/FLU/RSV plus assay is intended as an aid in the diagnosis of influenza from Nasopharyngeal swab specimens and should not be used as a sole basis for treatment. Nasal washings and aspirates are unacceptable for Xpert Xpress SARS-CoV-2/FLU/RSV testing.  Fact Sheet for Patients: EntrepreneurPulse.com.au  Fact Sheet for Healthcare Providers: IncredibleEmployment.be  This test is not yet approved or cleared by the [place] FDA and has been authorized for detection and/or diagnosis of SARS-CoV-2 by FDA under an Emergency Use Authorization (EUA). This EUA will remain in effect (meaning this test can be used) for the duration of the COVID-19 declaration under Section 564(b)(1) of the Act, 21 U.S.C. section 360bbb-3(b)(1), unless the authorization is terminated or revoked.  Performed at [facility], [place] [address] [place], [place] [address]   Blood culture (routine x 2)     Status: None   Collection Time: 01/27/21 12:32 AM   Specimen: Left Antecubital; Blood  Result Value Ref Range Status   Specimen Description   Final    LEFT ANTECUBITAL Performed at [place] [address] [place], [place] [address]    Special Requests   Final    BOTTLES DRAWN AEROBIC AND ANAEROBIC Blood Culture adequate volume Performed at [place] [address], [place], [place] [address]    Culture   Final    NO GROWTH 5 DAYS Performed at [facility], [place] [address] [place], [place] [address]    Report Status 02/01/2021 FINAL  Final  Blood culture (routine x 2)     Status: None   Collection Time: 01/27/21  3:21 AM   Specimen: Left Antecubital; Blood  Result Value Ref Range Status   Specimen Description   Final    LEFT  ANTECUBITAL Performed at [place] [address], [place], [place] [address]    Special Requests   Final    BOTTLES DRAWN AEROBIC AND ANAEROBIC Blood Culture results may not be optimal due to an excessive volume of blood received in culture bottles Performed at [place] [address], [place], [place] [address]    Culture   Final    NO GROWTH 5 DAYS Performed at [facility], [place] [address], [place], [place] [address]    Report Status 02/01/2021 FINAL  Final  Gastrointestinal Panel by PCR , Stool     Status: None   Collection Time: 01/27/21  6:33 AM   Specimen: Stool  Result Value Ref Range Status   Campylobacter species NOT DETECTED NOT DETECTED Final   Plesimonas shigelloides NOT DETECTED NOT DETECTED Final   Salmonella species NOT DETECTED NOT DETECTED Final   Yersinia enterocolitica NOT DETECTED NOT DETECTED Final   Vibrio species NOT DETECTED NOT DETECTED Final   Vibrio cholerae NOT DETECTED NOT DETECTED Final   Enteroaggregative E coli (EAEC) NOT DETECTED NOT DETECTED Final   Enteropathogenic E coli (EPEC) NOT DETECTED NOT DETECTED Final   Enterotoxigenic E coli (ETEC) NOT DETECTED NOT DETECTED Final   Shiga like toxin producing E coli (STEC) NOT DETECTED NOT DETECTED Final   Shigella/Enteroinvasive E coli (EIEC) NOT DETECTED NOT DETECTED Final   Cryptosporidium NOT DETECTED NOT DETECTED Final   Cyclospora cayetanensis NOT DETECTED NOT DETECTED Final   Entamoeba histolytica NOT DETECTED NOT DETECTED Final   Giardia lamblia NOT DETECTED NOT DETECTED Final   Adenovirus F40/41 NOT DETECTED NOT DETECTED Final  Astrovirus NOT DETECTED NOT DETECTED Final   Norovirus GI/GII NOT DETECTED NOT DETECTED Final   Rotavirus A NOT DETECTED NOT DETECTED Final   Sapovirus (I, II, IV, and V) NOT DETECTED NOT DETECTED Final    Comment: Performed at [facility], [place], [place] [address]  C Difficile Quick  Screen w PCR reflex     Status: Abnormal   Collection Time: 01/27/21  6:33 AM   Specimen: Stool  Result Value Ref Range Status   C Diff antigen POSITIVE (A) NEGATIVE Final   C Diff toxin NEGATIVE NEGATIVE Final   C Diff interpretation Results are indeterminate. See PCR results.  Final    Comment: Performed at [facility], [place] [address] [place], [place] [address]  C. Diff by PCR, Reflexed     Status: Abnormal   Collection Time: 01/27/21  6:33 AM  Result Value Ref Range Status   Toxigenic C. Difficile by PCR POSITIVE (A) NEGATIVE Final    Comment: Positive for toxigenic C. difficile with little to no toxin production. Only treat if clinical presentation suggests symptomatic illness. Performed at [facility], [place] [address], [place], [place] [address]   Culture, blood (routine x 2)     Status: None (Preliminary result)   Collection Time: 01/30/21  7:11 PM   Specimen: BLOOD  Result Value Ref Range Status   Specimen Description   Final    BLOOD BLOOD LEFT HAND Performed at [place] [address] [place], [place] [address]    Special Requests   Final    BOTTLES DRAWN AEROBIC ONLY Blood Culture adequate volume Performed at [place] [address] [place], [place] [address]    Culture   Final    NO GROWTH 4 DAYS Performed at [facility], [place] [address] [place], [place] [address]    Report Status PENDING  Incomplete  Culture, blood (routine x 2)     Status: None (Preliminary result)   Collection Time: 01/30/21  7:11 PM   Specimen: BLOOD  Result Value Ref Range Status   Specimen Description   Final    BLOOD LEFT WRIST Performed at [place] [address], [place], [place] [address]    Special Requests   Final    BOTTLES DRAWN AEROBIC ONLY Blood Culture adequate volume Performed at [place] [address] [place], [place] [address]    Culture    Final    NO GROWTH 4 DAYS Performed at [facility], [place] [address] [place],  [address]    Report Status PENDING  Incomplete    Radiology Reports CT ABDOMEN PELVIS WO CONTRAST  Result Date: 01/29/2021 CLINICAL DATA:  Bowel obstruction suspected. Abdominal pain, acute, nonlocalized. End-stage renal disease with previous renal transplant. EXAM: CT ABDOMEN AND PELVIS WITHOUT CONTRAST TECHNIQUE: Multidetector CT imaging of the abdomen and pelvis was performed following the standard protocol without IV contrast. COMPARISON:  01/26/2021 FINDINGS: Lower chest: Elevation of the right hemidiaphragm with mild atelectasis at the right lung base, similar to the previous study. Extensive coronary artery calcification. Hepatobiliary: Previous cholecystectomy.  No focal liver lesion. Pancreas: Normal Spleen: Normal Adrenals/Urinary Tract: Adrenal glands are normal. Markedly atrophic native kidneys without significant finding. Small cysts as seen previously. Transplant kidney in the right iliac fossa without abnormal finding by CT. Bladder appears normal. Stomach/Bowel: Stomach and small intestine are normal. Diverticulosis of the colon but  no evidence of diverticulitis. No bowel obstruction or focal bowel lesion. Vascular/Lymphatic: Aortic atherosclerosis. No aneurysm. IVC is normal. No retroperitoneal adenopathy. Reproductive: No pelvic mass. Other: No free fluid or air. Musculoskeletal: No significant skeletal finding. IMPRESSION: No bowel obstruction or acute bowel pathology. Diverticulosis without evidence of diverticulitis. Elevation of the right hemidiaphragm with atelectasis at the right lung base. Atrophic native kidneys. Grossly normal appearing transplant kidney in the right iliac fossa. Aortic atherosclerosis. Widespread vascular calcification consistent with chronic renal failure. Electronically Signed   By: Nelson Chimes M.D.   On: 01/29/2021 16:10   CT ABDOMEN PELVIS WO CONTRAST  Result  Date: 01/26/2021 CLINICAL DATA:  Concern for bowel obstruction. EXAM: CT ABDOMEN AND PELVIS WITHOUT CONTRAST TECHNIQUE: Multidetector CT imaging of the abdomen and pelvis was performed following the standard protocol without IV contrast. COMPARISON:  CT abdomen pelvis dated 08/21/2020. FINDINGS: Evaluation of this exam is limited in the absence of intravenous contrast. Lower chest: Minimal bibasilar atelectasis. The visualized lung bases are otherwise clear. There is advanced 3 vessel coronary vascular calcification. No intra-abdominal free air or free fluid. Hepatobiliary: The liver is unremarkable. No intrahepatic biliary dilatation. Cholecystectomy. No retained calcified stone noted in the central CBD. Pancreas: Unremarkable. No pancreatic ductal dilatation or surrounding inflammatory changes. Spleen: Normal in size without focal abnormality. Adrenals/Urinary Tract: The adrenal glands are unremarkable. Atrophic native kidneys. Bilateral renal parenchyma nodularity similar to prior CT may represent residual renal parenchyma. A mass is less likely but not excluded. The visualized ureters appear unremarkable. The urinary bladder is collapsed. There is a right lower quadrant renal transplant. There is no hydronephrosis or nephrolithiasis of the transplant kidney. No peritransplant fluid collection. Stomach/Bowel: There is diffuse colonic diverticulosis without active inflammatory changes. There is mild thickened appearance of the jejunal folds with engorgement of the associated mesentery. Clinical correlation is recommended to evaluate for possibility of enteritis. There is no bowel obstruction. The appendix is normal. Vascular/Lymphatic: Moderate aortoiliac atherosclerotic disease. The IVC is unremarkable. No portal venous gas. There is no adenopathy. Reproductive: The uterus is grossly unremarkable. No adnexal masses. Other: Mild diffuse subcutaneous stranding.  No fluid collection. Musculoskeletal: Osteopenia with  degenerative changes of the spine. Multiple old left rib fractures. No acute osseous pathology. IMPRESSION: 1. Mild thickened appearance of the jejunal folds with engorgement of the associated mesentery. Clinical correlation is recommended to evaluate for possibility of enteritis. No bowel obstruction. Normal appendix. 2. Colonic diverticulosis. 3. Atrophic native kidneys with a right lower quadrant renal transplant. No hydronephrosis or nephrolithiasis. 4. Aortic Atherosclerosis (ICD10-I70.0). Electronically Signed   By: Anner Crete M.D.   On: 01/26/2021 20:23   DG Chest 2 View  Result Date: 01/26/2021 CLINICAL DATA:  Lower abdominal pain since Friday, tachycardia EXAM: CHEST - 2 VIEW COMPARISON:  08/28/2020 FINDINGS: Frontal and lateral views of the chest demonstrate an unremarkable cardiac silhouette. No acute airspace disease, effusion, or pneumothorax. No acute bony abnormalities. IMPRESSION: 1. No acute intrathoracic process. Electronically Signed   By: Randa Ngo M.D.   On: 01/26/2021 22:17   DG Abd 2 Views  Result Date: 01/29/2021 CLINICAL DATA:  Abdominal pain EXAM: ABDOMEN - 2 VIEW COMPARISON:  None. FINDINGS: There is mildly dilated of the cecum. Nondistended small bowel. No acute osseous abnormality. There are soft tissue calcifications consistent with subcutaneous calcification seen on prior CT. IMPRESSION: Mild dilation of the cecum. If there is concern for obstruction recommend CT. Electronically Signed   By: Maurine Simmering M.D.   On:  01/29/2021 09:25   DG Abd Portable 1V  Result Date: 02/03/2021 CLINICAL DATA:  Nasogastric tube placement. EXAM: PORTABLE ABDOMEN - 1 VIEW COMPARISON:  X-ray abdomen 01/29/2021, CT abdomen pelvis 01/29/2021 FINDINGS: Enteric tube with tip overlying the expected region of the pyloric region/first portion of the duodenum. The bowel gas pattern is normal. Status post cholecystectomy. No radio-opaque calculi or other significant radiographic abnormality are  seen. Aortic atherosclerosis. IMPRESSION: 1. Enteric tube with tip overlying the expected region of the pyloric region/first portion of the duodenum. 2. Aortic Atherosclerosis (ICD10-I70.0). Electronically Signed   By: Iven Finn M.D.   On: 02/03/2021 16:22   ECHOCARDIOGRAM COMPLETE  Result Date: 02/03/2021    ECHOCARDIOGRAM REPORT   Patient Name:   Connie Ruiz Date of Exam: 02/03/2021 Medical Rec #:  [id number]    Height:       64.0 in Accession #:    [id number]   Weight:       213.0 lb Date of Birth:  [birth year]    BSA:          2.009 m Patient Age:    50 years     BP:           163/66 mmHg Patient Gender: F            HR:           89 bpm. Exam Location:  Inpatient Procedure: 2D Echo, Cardiac Doppler and Color Doppler Indications:    R01.1 Murmur  History:        Patient has prior history of Echocardiogram examinations, most                 recent 08/14/2018. CHF, Arrythmias:Tachycardia; Risk                 Factors:Hypertension, Diabetes and Dyslipidemia.  Sonographer:    MH Referring Phys: [place]  1. Left ventricular ejection fraction, by estimation, is 65 to 70%. The left ventricle has normal function. The left ventricle has no regional wall motion abnormalities. There is mild concentric left ventricular hypertrophy. Left ventricular diastolic parameters are consistent with Grade I diastolic dysfunction (impaired relaxation).  2. Right ventricular systolic function is normal. The right ventricular size is normal.  3. The mitral valve is normal in structure. Mild mitral valve regurgitation. No evidence of mitral stenosis.  4. The aortic valve is normal in structure. Aortic valve regurgitation is not visualized. Mild aortic valve sclerosis is present, with no evidence of aortic valve stenosis.  5. The inferior vena cava is normal in size with <50% respiratory variability, suggesting right atrial pressure of 8 mmHg. FINDINGS  Left Ventricle: Left ventricular ejection fraction,  by estimation, is 65 to 70%. The left ventricle has normal function. The left ventricle has no regional wall motion abnormalities. The left ventricular internal cavity size was normal in size. There is  mild concentric left ventricular hypertrophy. Left ventricular diastolic parameters are consistent with Grade I diastolic dysfunction (impaired relaxation). Indeterminate filling pressures. Right Ventricle: The right ventricular size is normal. No increase in right ventricular wall thickness. Right ventricular systolic function is normal. Left Atrium: Left atrial size was normal in size. Right Atrium: Right atrial size was normal in size. Pericardium: There is no evidence of pericardial effusion. Mitral Valve: The mitral valve is normal in structure. Mild to moderate mitral annular calcification. Mild mitral valve regurgitation. No evidence of mitral valve stenosis. Tricuspid Valve: The tricuspid valve is normal  in structure. Tricuspid valve regurgitation is not demonstrated. No evidence of tricuspid stenosis. Aortic Valve: The aortic valve is normal in structure. Aortic valve regurgitation is not visualized. Mild aortic valve sclerosis is present, with no evidence of aortic valve stenosis. Aortic valve mean gradient measures 4.0 mmHg. Aortic valve peak gradient measures 7.4 mmHg. Aortic valve area, by VTI measures 3.13 cm. Pulmonic Valve: The pulmonic valve was normal in structure. Pulmonic valve regurgitation is not visualized. No evidence of pulmonic stenosis. Aorta: The aortic root is normal in size and structure. Venous: The inferior vena cava is normal in size with less than 50% respiratory variability, suggesting right atrial pressure of 8 mmHg. IAS/Shunts: No atrial level shunt detected by color flow Doppler.  LEFT VENTRICLE PLAX 2D LVIDd:         4.00 cm     Diastology LVIDs:         2.70 cm     LV e' medial:    4.57 cm/s LV PW:         1.30 cm     LV E/e' medial:  14.7 LV IVS:        1.70 cm     LV e'  lateral:   5.00 cm/s LVOT diam:     2.20 cm     LV E/e' lateral: 13.5 LV SV:         68 LV SV Index:   34 LVOT Area:     3.80 cm  LV Volumes (MOD) LV vol d, MOD A4C: 26.9 ml LV vol s, MOD A4C: 12.0 ml LV SV MOD A4C:     26.9 ml RIGHT VENTRICLE             IVC RV S prime:     10.80 cm/s  IVC diam: 1.70 cm TAPSE (M-mode): 1.6 cm LEFT ATRIUM           Index       RIGHT ATRIUM          Index LA diam:      2.00 cm 1.00 cm/m  RA Area:     7.00 cm LA Vol (A2C): 36.3 ml 18.07 ml/m RA Volume:   12.40 ml 6.17 ml/m LA Vol (A4C): 14.0 ml 6.97 ml/m  AORTIC VALVE AV Area (Vmax):    3.21 cm AV Area (Vmean):   2.97 cm AV Area (VTI):     3.13 cm AV Vmax:           136.00 cm/s AV Vmean:          93.700 cm/s AV VTI:            0.216 m AV Peak Grad:      7.4 mmHg AV Mean Grad:      4.0 mmHg LVOT Vmax:         115.00 cm/s LVOT Vmean:        73.300 cm/s LVOT VTI:          0.178 m LVOT/AV VTI ratio: 0.82  AORTA Ao Root diam: 3.10 cm Ao Asc diam:  2.90 cm MITRAL VALVE MV Area (PHT): 3.67 cm     SHUNTS MV E velocity: 67.28 cm/s   Systemic VTI:  0.18 m MV A velocity: 158.00 cm/s  Systemic Diam: 2.20 cm MV E/A ratio:  0.43 Mihai Croitoru MD Electronically signed by Sanda Klein MD Signature Date/Time: 02/03/2021/4:54:42 PM    Final     Lab Data:  CBC: Recent Labs  Lab 01/31/21 [phone]  02/01/21 0618 02/02/21 0356 02/03/21 0505 02/04/21 0505 02/04/21 1446  WBC 9.4 10.7* 10.0 9.1 12.5*  --   HGB 12.3 11.8* 12.6 11.3* 11.8* 12.6  HCT 39.9 36.7 39.6 36.0 36.9 37.0  MCV 87.3 85.2 85.9 87.0 84.8  --   PLT 283 276 190 248 262  --    Basic Metabolic Panel: Recent Labs  Lab 01/29/21 0515 01/30/21 0526 01/31/21 0713 02/01/21 0618 02/03/21 1711 02/04/21 0505 02/04/21 1446  NA 141 139 140 140  --  142 138  K 3.6 3.3* 3.6 3.6  --  2.7* 3.2*  CL 113* 112* 111 112*  --  107 101  CO2 20* 22 19* 23  --  30  --   GLUCOSE 97 79 205* 197*  --  170* 172*  BUN 7 <5* 7 8  --  <5* <3*  CREATININE 0.54 0.40* 0.61 0.45  --   0.44 0.40*  CALCIUM 8.3* 8.1* 8.6* 8.7*  --  8.9  --   MG 1.5* 1.1* 2.0  --  1.3* 1.2*  --   PHOS  --   --   --   --  1.6* 2.2*  --    GFR: Estimated Creatinine Clearance: 84.4 mL/min (A) (by C-G formula based on SCr of 0.4 mg/dL (L)). Liver Function Tests: Recent Labs  Lab 02/01/21 0618  AST 12*  ALT 10  ALKPHOS 47  BILITOT 0.4  PROT 6.1*  ALBUMIN 2.9*   Recent Labs  Lab 01/29/21 0515  LIPASE 26   No results for input(s): AMMONIA in the last 168 hours. Coagulation Profile: Recent Labs  Lab 01/31/21 2009 02/01/21 0535 02/02/21 0356 02/03/21 0505 02/04/21 0505  INR 1.8* 1.3* 1.2 1.2 1.2   Cardiac Enzymes: No results for input(s): CKTOTAL, CKMB, CKMBINDEX, TROPONINI in the last 168 hours. BNP (last 3 results) No results for input(s): PROBNP in the last 8760 hours. HbA1C: No results for input(s): HGBA1C in the last 72 hours.  CBG: Recent Labs  Lab 02/03/21 2049 02/04/21 0136 02/04/21 0734 02/04/21 1129 02/04/21 1627  GLUCAP 155* 176* 144* 120* 167*   Lipid Profile: No results for input(s): CHOL, HDL, LDLCALC, TRIG, CHOLHDL, LDLDIRECT in the last 72 hours. Thyroid Function Tests: No results for input(s): TSH, T4TOTAL, FREET4, T3FREE, THYROIDAB in the last 72 hours.  Anemia Panel: No results for input(s): VITAMINB12, FOLATE, FERRITIN, TIBC, IRON, RETICCTPCT in the last 72 hours. Urine analysis:    Component Value Date/Time   COLORURINE YELLOW 01/26/2021 2156   APPEARANCEUR CLEAR 01/26/2021 2156   LABSPEC >1.030 (H) 01/26/2021 2156   PHURINE 6.0 01/26/2021 2156   GLUCOSEU NEGATIVE 01/26/2021 2156   GLUCOSEU NEGATIVE 10/22/2013 1621   HGBUR NEGATIVE 01/26/2021 2156   BILIRUBINUR MODERATE (A) 01/26/2021 2156   KETONESUR 40 (A) 01/26/2021 2156   PROTEINUR TRACE (A) 01/26/2021 2156   UROBILINOGEN 1.0 03/27/2015 1534   NITRITE NEGATIVE 01/26/2021 2156   LEUKOCYTESUR NEGATIVE 01/26/2021 2156     Kathie Dike M.D. [facility] 02/04/2021, 6:12  PM  Available via Epic secure chat 7am-7pm After 7 pm, please refer to night coverage provider listed on amion.

## 2021-02-04 NOTE — Anesthesia Preprocedure Evaluation (Addendum)
Anesthesia Evaluation  Patient identified by MRN, date of birth, ID band Patient awake    Reviewed: Allergy & Precautions, NPO status , Patient's Chart, lab work & pertinent test results  Airway Mallampati: II  TM Distance: >3 FB Neck ROM: Full    Dental  (+) Dental Advisory Given, Missing, Loose, Poor Dentition, Chipped   Pulmonary neg pulmonary ROS,    Pulmonary exam normal breath sounds clear to auscultation       Cardiovascular hypertension, +CHF   Rhythm:Regular Rate:Normal + Systolic murmurs Echo 7/86/7672 1. Left ventricular ejection fraction, by estimation, is 65 to 70%. The left ventricle has normal function. The left ventricle has no regional wall motion abnormalities. There is mild concentric left ventricular hypertrophy. Left ventricular diastolic parameters are consistent with Grade I diastolic dysfunction (impaired relaxation).  2. Right ventricular systolic function is normal. The right ventricular size is normal.  3. The mitral valve is normal in structure. Mild mitral valve  regurgitation. No evidence of mitral stenosis.  4. The aortic valve is normal in structure. Aortic valve regurgitation is not visualized. Mild aortic valve sclerosis is present, with no evidence of aortic valve stenosis.  5. The inferior vena cava is normal in size with <50% respiratory variability, suggesting right atrial pressure of 8 mmHg.    Neuro/Psych PSYCHIATRIC DISORDERS Anxiety Depression Dementia TIA Neuromuscular disease CVA    GI/Hepatic Neg liver ROS, GERD  ,  Endo/Other  diabetesHypothyroidism   Renal/GU Renal disease     Musculoskeletal negative musculoskeletal ROS (+)   Abdominal   Peds  Hematology negative hematology ROS (+)   Anesthesia Other Findings   Reproductive/Obstetrics                           Anesthesia Physical Anesthesia Plan  ASA: 3  Anesthesia Plan: MAC   Post-op  Pain Management:    Induction: Intravenous  PONV Risk Score and Plan: 2 and Propofol infusion, Treatment may vary due to age or medical condition and Ondansetron  Airway Management Planned: Natural Airway  Additional Equipment: None  Intra-op Plan:   Post-operative Plan:   Informed Consent: I have reviewed the patients History and Physical, chart, labs and discussed the procedure including the risks, benefits and alternatives for the proposed anesthesia with the patient or authorized representative who has indicated his/her understanding and acceptance.     Dental advisory given  Plan Discussed with: CRNA  Anesthesia Plan Comments: (Spoke with patient's daughter as well as patient was slow to respond to my questions.)      Anesthesia Quick Evaluation

## 2021-02-04 NOTE — Progress Notes (Signed)
ANTICOAGULATION CONSULT NOTE - follow up  Pharmacy Consult for Heparin Indication: H/O DVT/PE  Patient Measurements: Height: 5\' 4"  (162.6 cm) Weight: 96.6 kg (212 lb 15.4 oz) IBW/kg (Calculated) : 54.7 Heparin Dosing Weight: 76 kg  Medications:  Warfarin PTA dosing of 6.5 mg daily  Assessment: 60 y/o F [name] on warfarin PTA for a h/o DVT/PE. Pharmacy consulted to dose heparin until patient consistently tolerating PO. Patient did receive vitamin K 2 mg iv on 8/27.   02/04/2021 @ 1:19 AM Heparin level 0.49- therapeutic on heparin 950 units/hr No bleeding or complications reported by RN   CBC stable  Goal of Therapy:  Heparin level 0.3-0.7 units/ml Monitor platelets by anticoagulation protocol: Yes   Plan:  Continue Heparin at 950 units/hr Recheck level in 6 hr (0500 daily labs) GI to do Flex sigmoidoscopy with biopsies at 1500 and heparin will be held 4 hours prior to procedure at 11 am  Daily CBC, Heparin level & INR   Netta Cedars, Pharm.D 02/04/2021 1:19 AM

## 2021-02-04 NOTE — Progress Notes (Signed)
ANTICOAGULATION CONSULT NOTE - follow up  Pharmacy Consult for Heparin Indication: H/O DVT/PE  Patient Measurements: Height: 5\' 4"  (162.6 cm) Weight: 96.6 kg (212 lb 15.4 oz) IBW/kg (Calculated) : 54.7 Heparin Dosing Weight: 76 kg  Medications:  Warfarin PTA dosing of 6.5 mg daily  Assessment: 60 y/o F [name] on warfarin PTA for a h/o DVT/PE. Pharmacy consulted to dose heparin until patient consistently tolerating PO. Patient did receive vitamin K 2 mg iv on 8/27.  Warfarin resumed 8/23, no Warfarin 8/24, received 3mg  8/25 > held. Order to resume Warfarin 8/30 > discontinued   Today, 02/04/2021 8/30 pm Heparin level 0.49- therapeutic on heparin 950 units/hr No bleeding or complications reported by RN   CBC stable, INR 1.2 0500 Hep level 0.39, remains in therapeutic range   Goal of Therapy:  Heparin level 0.3-0.7 units/ml Monitor platelets by anticoagulation protocol: Yes   Plan:  Continue Heparin at 950 units/hr Flex sigmoidoscopy with biopsies at 1500, hold Heparin 4 hours prior to procedure at 11 am  Daily CBC, Heparin level & INR  [name] PharmD [facility] [phone] 02/04/2021, 7:14 AM

## 2021-02-04 NOTE — Progress Notes (Addendum)
ANTICOAGULATION CONSULT NOTE - follow up  Pharmacy Consult for Heparin & warfarin Indication: H/O DVT/PE  Patient Measurements: Height: 5\' 4"  (162.6 cm) Weight: 96.6 kg (212 lb 15.4 oz) IBW/kg (Calculated) : 54.7 Heparin Dosing Weight: 76 kg  Medications:  Warfarin PTA dosing of 6.5 mg daily  Assessment: 60 y/o F [name] on warfarin PTA for a h/o DVT/PE. Pharmacy consulted to dose heparin until patient consistently tolerating PO. Patient did receive vitamin K 2 mg iv on 8/27.  Warfarin resumed 8/23, no Warfarin 8/24, received 3mg  8/25 > held. Order to resume Warfarin 8/30 > discontinued   Today, 02/04/2021 CBC stable, INR 1.2 0500 Hep level 0.39, remains in therapeutic range Heparin stopped at 11 am for Flex sig w/ biopsies Pharmacy consulted to resume heparin drip now, no complications per notes Pharmacy consulted to resume PTA warfarin tonight  Goal of Therapy:  Heparin level 0.3-0.7 units/ml Monitor platelets by anticoagulation protocol: Yes   Plan:  Resume Heparin drip at 950 units/hr with no bolus Check 8 hr heparin level Warfarin 6 mg po x 1 dose tonight Daily CBC, Heparin level & INR   Eudelia Bunch, Pharm.D [phone] 02/04/2021 5:23 PM

## 2021-02-04 NOTE — H&P (View-Only) (Signed)
ANTICOAGULATION CONSULT NOTE - follow up  Pharmacy Consult for Heparin Indication: H/O DVT/PE  Patient Measurements: Height: 5\' 4"  (162.6 cm) Weight: 96.6 kg (212 lb 15.4 oz) IBW/kg (Calculated) : 54.7 Heparin Dosing Weight: 76 kg  Medications:  Warfarin PTA dosing of 6.5 mg daily  Assessment: 60 y/o F [name] on warfarin PTA for a h/o DVT/PE. Pharmacy consulted to dose heparin until patient consistently tolerating PO. Patient did receive vitamin K 2 mg iv on 8/27.  Warfarin resumed 8/23, no Warfarin 8/24, received 3mg  8/25 > held. Order to resume Warfarin 8/30 > discontinued   Today, 02/04/2021 8/30 pm Heparin level 0.49- therapeutic on heparin 950 units/hr No bleeding or complications reported by RN   CBC stable, INR 1.2 0500 Hep level 0.39, remains in therapeutic range   Goal of Therapy:  Heparin level 0.3-0.7 units/ml Monitor platelets by anticoagulation protocol: Yes   Plan:  Continue Heparin at 950 units/hr Flex sigmoidoscopy with biopsies at 1500, hold Heparin 4 hours prior to procedure at 11 am  Daily CBC, Heparin level & INR  [name] PharmD [facility] [phone] 02/04/2021, 7:14 AM

## 2021-02-04 NOTE — Interval H&P Note (Signed)
History and Physical Interval Note:  02/04/2021 2:45 PM  Connie Ruiz  has presented today for surgery, with the diagnosis of Diarrhea.  The various methods of treatment have been discussed with the patient and family. After consideration of risks, benefits and other options for treatment, the patient has consented to  Procedure(s): FLEXIBLE SIGMOIDOSCOPY (N/A) as a surgical intervention.  The patient's history has been reviewed, patient examined, no change in status, stable for surgery.  I have reviewed the patient's chart and labs.  Questions were answered to the patient's satisfaction.     [name]

## 2021-02-04 NOTE — Transfer of Care (Signed)
Immediate Anesthesia Transfer of Care Note  Patient: Connie Ruiz  Procedure(s) Performed: FLEXIBLE SIGMOIDOSCOPY BIOPSY  Patient Location: Endoscopy Unit  Anesthesia Type:MAC  Level of Consciousness: drowsy and patient cooperative  Airway & Oxygen Therapy: Patient Spontanous Breathing and Patient connected to face mask oxygen  Post-op Assessment: Report given to RN and Post -op Vital signs reviewed and stable  Post vital signs: Reviewed and stable  Last Vitals:  Vitals Value Taken Time  BP    Temp    Pulse    Resp    SpO2      Last Pain:  Vitals:   02/04/21 1427  TempSrc: Oral  PainSc: 8       Patients Stated Pain Goal: 3 (45/03/88 8280)  Complications: No notable events documented.

## 2021-02-04 NOTE — Op Note (Signed)
[facility] Patient Name: Connie Ruiz Procedure Date: 02/04/2021 MRN: [id number] Attending MD: [name] , MD Date of Birth: 04-16-1961 CSN: [id number] Age: 60 Admit Type: Inpatient Procedure:                Flexible sigmoidoscopy with random biopsies Indications:              Diarrhea of presumed infectious origin Providers:                [name], MD, Jeanella Cara, RN, Tyna Jaksch Technician, Adair Laundry, CRNA Referring MD:              Medicines:                Monitored Anesthesia Care Complications:            No immediate complications. Estimated Blood Loss:     Estimated blood loss was minimal. Procedure:                Pre-Anesthesia Assessment: - Prior to the                            procedure, a history and physical was performed,                            and patient medications and allergies were                            reviewed. The patient's tolerance of previous                            anesthesia was also reviewed. The risks and                            benefits of the procedure and the sedation options                            and risks were discussed with the patient. All                            questions were answered, and informed consent was                            obtained. Prior Anticoagulants: The patient has                            taken Coumadin (warfarin), last dose was 1 day                            prior to procedure. ASA Grade Assessment: IV - A                            patient with severe systemic disease that is a  constant threat to life. After reviewing the risks                            and benefits, the patient was deemed in                            satisfactory condition to undergo the procedure.                            After obtaining informed consent, the scope was                            passed under direct vision. The  [id number]-HQ190L                            ([id number]) Olympus colonoscope was introduced                            through the anus and advanced to the the descending                            colon. The flexible sigmoidoscopy was performed                            with moderate difficulty due to inadequate bowel                            prep. Successful completion of the procedure was                            aided by lavage. The patient tolerated the                            procedure well. The quality of the bowel                            preparation was fair. Scope In: Scope Out: Findings:      Edematous mucosa was noted with loss of vascular markings throughout the       left colon and rectum-random biopsies were done to rule out micorscopic       colitis; no psuedomembranes were noted.      Retroflexion was attempted but was not possible due to por sphincter       tone. Impression:               - Edematous mucosa with loss of vascular markings                            noted throughout the colon-random biopsies done.                           - No psuedomembranes noted on exam. Moderate Sedation:      MAC used. Recommendation:           - Resume tube feeds today.                           -  Await pathology results.                           - Resume Heparin and Coumadin. Procedure Code(s):        --- Professional ---                           [phone], Sigmoidoscopy, flexible; diagnostic,                            including collection of specimen(s) by brushing or                            washing, when performed (separate procedure) Diagnosis Code(s):        --- Professional ---                           R19.7, Diarrhea, unspecified CPT copyright 2019 American Medical Association. All rights reserved. The codes documented in this report are preliminary and upon coder review may  be revised to meet current compliance requirements. [name], MD [name],  MD 02/04/2021 3:31:28 PM This report has been signed electronically. Number of Addenda: 0

## 2021-02-04 NOTE — Anesthesia Postprocedure Evaluation (Signed)
Anesthesia Post Note  Patient: Madelin Headings  Procedure(s) Performed: FLEXIBLE SIGMOIDOSCOPY BIOPSY     Patient location during evaluation: PACU Anesthesia Type: MAC Level of consciousness: awake and alert Pain management: pain level controlled Vital Signs Assessment: post-procedure vital signs reviewed and stable Respiratory status: spontaneous breathing, nonlabored ventilation, respiratory function stable and patient connected to nasal cannula oxygen Cardiovascular status: stable and blood pressure returned to baseline Postop Assessment: no apparent nausea or vomiting Anesthetic complications: no   No notable events documented.  Last Vitals:  Vitals:   02/04/21 1539 02/04/21 1549  BP: (!) 165/90 (!) 171/90  Pulse: (!) 106 (!) 102  Resp: 13 13  Temp:    SpO2: 97% 97%    Last Pain:  Vitals:   02/04/21 1701  TempSrc:   PainSc: 8                  Tiajuana Amass

## 2021-02-04 NOTE — Progress Notes (Signed)
Upon arrival to GI recovery area, pt c/o 10/10 lower abdominal pain. MD R. Fitzgerald notified. No new orders at this time.  Debarah Crape, BSN, RN

## 2021-02-04 NOTE — TOC Progression Note (Signed)
Transition of Care [facility]) - Progression Note    Patient Details  Name: Connie Ruiz MRN: [id number] Date of Birth: 1960/07/11  [facility] CM/SW Contact  Graysen Woodyard, Juliann Pulse, RN Phone Number: 02/04/2021, 11:55 AM  Clinical Narrative: [place] chosen by dtr [place] within 24-48hrs to be medically stable prior getting auth for SNF at [facility] rep [facility] following.will need covid ordered.On TF.      Expected Discharge Plan: [place] Barriers to Discharge: Continued Medical Work up  Expected Discharge Plan and Services Expected Discharge Plan: [place] Choice: [place] arrangements for the past 2 months: [place]                                       Social Determinants of Health (SDOH) Interventions    Readmission Risk Interventions No flowsheet data found.

## 2021-02-05 DIAGNOSIS — K529 Noninfective gastroenteritis and colitis, unspecified: Secondary | ICD-10-CM | POA: Diagnosis not present

## 2021-02-05 DIAGNOSIS — I5032 Chronic diastolic (congestive) heart failure: Secondary | ICD-10-CM | POA: Diagnosis not present

## 2021-02-05 DIAGNOSIS — I825Z9 Chronic embolism and thrombosis of unspecified deep veins of unspecified distal lower extremity: Secondary | ICD-10-CM | POA: Diagnosis not present

## 2021-02-05 DIAGNOSIS — A0472 Enterocolitis due to Clostridium difficile, not specified as recurrent: Secondary | ICD-10-CM | POA: Diagnosis not present

## 2021-02-05 LAB — GLUCOSE, CAPILLARY
Glucose-Capillary: 228 mg/dL — ABNORMAL HIGH (ref 70–99)
Glucose-Capillary: 257 mg/dL — ABNORMAL HIGH (ref 70–99)
Glucose-Capillary: 233 mg/dL — ABNORMAL HIGH (ref 70–99)
Glucose-Capillary: 160 mg/dL — ABNORMAL HIGH (ref 70–99)
Glucose-Capillary: 237 mg/dL — ABNORMAL HIGH (ref 70–99)
Glucose-Capillary: 299 mg/dL — ABNORMAL HIGH (ref 70–99)

## 2021-02-05 LAB — BASIC METABOLIC PANEL
Anion gap: 9 (ref 5–15)
BUN: 5 mg/dL — ABNORMAL LOW (ref 6–20)
CO2: 26 mmol/L (ref 22–32)
Calcium: 8.3 mg/dL — ABNORMAL LOW (ref 8.9–10.3)
Chloride: 102 mmol/L (ref 98–111)
Creatinine, Ser: 0.53 mg/dL (ref 0.44–1.00)
GFR, Estimated: >60 mL/min (ref >60)
Glucose, Bld: 242 mg/dL — ABNORMAL HIGH (ref 70–99)
Potassium: 3.2 mmol/L — ABNORMAL LOW (ref 3.5–5.1)
Sodium: 137 mmol/L (ref 135–145)

## 2021-02-05 LAB — PHOSPHORUS: Phosphorus: 2.5 mg/dL (ref 2.5–4.6)

## 2021-02-05 LAB — CBC
HCT: 33.6 % — ABNORMAL LOW (ref 36.0–46.0)
Hemoglobin: 10.4 g/dL — ABNORMAL LOW (ref 12.0–15.0)
MCH: 26.9 pg (ref 26.0–34.0)
MCHC: 31 g/dL (ref 30.0–36.0)
MCV: 87 fL (ref 80.0–100.0)
Platelets: 224 10*3/uL (ref 150–400)
RBC: 3.86 MIL/uL — ABNORMAL LOW (ref 3.87–5.11)
RDW: 15.9 % — ABNORMAL HIGH (ref 11.5–15.5)
WBC: 9.9 10*3/uL (ref 4.0–10.5)
nRBC: 0 % (ref 0.0–0.2)

## 2021-02-05 LAB — MAGNESIUM: Magnesium: 1.6 mg/dL — ABNORMAL LOW (ref 1.7–2.4)

## 2021-02-05 LAB — HEPARIN LEVEL (UNFRACTIONATED)
Heparin Unfractionated: 0.25 IU/mL — ABNORMAL LOW (ref 0.30–0.70)
Heparin Unfractionated: 0.33 IU/mL (ref 0.30–0.70)

## 2021-02-05 LAB — CULTURE, BLOOD (ROUTINE X 2)
Culture: NO GROWTH
Culture: NO GROWTH
Special Requests: ADEQUATE
Special Requests: ADEQUATE

## 2021-02-05 LAB — SURGICAL PATHOLOGY

## 2021-02-05 LAB — PROTIME-INR
INR: 1.2 (ref 0.8–1.2)
Prothrombin Time: 15.1 seconds (ref 11.4–15.2)

## 2021-02-05 MED ORDER — LOPERAMIDE HCL 2 MG PO CAPS
2.0000 mg | ORAL_CAPSULE | Freq: Three times a day (TID) | ORAL | Status: DC
  Administered 2021-02-05 – 2021-02-07 (×5): 2 mg via ORAL
  Filled 2021-02-05 (×5): qty 1

## 2021-02-05 MED ORDER — MAGNESIUM SULFATE 2 GM/50ML IV SOLN
2.0000 g | Freq: Once | INTRAVENOUS | Status: AC
  Administered 2021-02-05: 2 g via INTRAVENOUS
  Filled 2021-02-05: qty 50

## 2021-02-05 MED ORDER — WARFARIN SODIUM 6 MG PO TABS
6.0000 mg | ORAL_TABLET | Freq: Once | ORAL | Status: AC
  Administered 2021-02-05: 6 mg via ORAL
  Filled 2021-02-05: qty 1

## 2021-02-05 MED ORDER — POTASSIUM CHLORIDE 10 MEQ/100ML IV SOLN
10.0000 meq | INTRAVENOUS | Status: AC
  Administered 2021-02-05 – 2021-02-06 (×4): 10 meq via INTRAVENOUS
  Filled 2021-02-05 (×4): qty 100

## 2021-02-05 NOTE — Progress Notes (Addendum)
ANTICOAGULATION CONSULT NOTE - follow up  Please disregard this note- was signed instead of pended earlier today - see next Anti-coag note today 9/1  Pharmacy Consult for Heparin & warfarin Indication: H/O DVT/PE  Patient Measurements: Height: 5\' 4"  (162.6 cm) Weight: 96.6 kg (212 lb 15.4 oz) IBW/kg (Calculated) : 54.7 Heparin Dosing Weight: 76 kg  Medications:  Warfarin PTA dosing of 6.5 mg daily  Assessment: 60 y/o F [name] on warfarin PTA for a h/o DVT/PE. Pharmacy consulted to dose heparin until patient consistently tolerating PO. Patient did receive vitamin K 2 mg iv on 8/27.  Warfarin resumed 8/23, no Warfarin 8/24, received 3mg  8/25 > held. Order to resume Warfarin 8/30 > discontinued  8/31 Flex sig at 3pm, heparin stopped 11am.  Heparin and Warfarin resumed after procedure  Today, 02/05/2021  CBC- Hg slightly low at 10.4, pltc WNL Scr 0.53 No bleeding or infusion related concerns per RN  Goal of Therapy:  Heparin level 0.3-0.7 units/ml Monitor platelets by anticoagulation protocol: Yes   Plan:   Heparin at 1000 units/hr  Warfarin Daily CBC, Heparin level & INR  [name] PharmD [facility] [phone] 02/05/2021, 8:12 AM

## 2021-02-05 NOTE — Progress Notes (Signed)
ANTICOAGULATION CONSULT NOTE - follow up  Pharmacy Consult for Heparin & warfarin Indication: H/O DVT/PE  Patient Measurements: Height: 5\' 4"  (162.6 cm) Weight: 96.6 kg (212 lb 15.4 oz) IBW/kg (Calculated) : 54.7 Heparin Dosing Weight: 76 kg  Medications:  Warfarin PTA dosing of 6.5 mg daily  Assessment: 60 y/o F [name] on warfarin PTA for a h/o DVT/PE. Pharmacy consulted to dose heparin until patient consistently tolerating PO. Patient did receive vitamin K 2 mg iv on 8/27.  Warfarin resumed 8/23, no Warfarin 8/24, received 3mg  8/25 > held. Order to resume Warfarin 8/30 > discontinued   Today, 02/05/2021 Initial heparin level after restart 0.25- slightly below goal range on 950 units/hr.  Previously therapeutic on this rate. CBC- Hg slightly low at 10.4, pltc WNL Scr 0.53 No bleeding or infusion related concerns per RN  Goal of Therapy:  Heparin level 0.3-0.7 units/ml Monitor platelets by anticoagulation protocol: Yes   Plan:  Increase Heparin drip slightly to 1000 units/hr  Check 8 hr heparin level Daily CBC, Heparin level & INR  Netta Cedars, Pharm.D 02/05/2021 2:10 AM

## 2021-02-05 NOTE — Progress Notes (Signed)
         INFECTIOUS DISEASE ATTENDING ADDENDUM:   Date: 02/05/2021  Patient name: Connie Ruiz  Medical record number: [id number]  Date of birth: August 17, 1960   Patient's endoscopy not consistent with C. difficile colitis.  We are stopping D[employer].  I will sign off for now please call with further questions.  Alcide Evener 02/05/2021, 12:37 PM

## 2021-02-05 NOTE — Progress Notes (Signed)
[facility]                                                                              Patient Demographics  Connie Ruiz, is a 60 y.o. female, DOB - May 31, 1961, MPN:[id number]  Admit date - 01/26/2021   Admitting Physician Donnamarie Poag [place] DO  Outpatient Primary MD for the patient is [name], [name], MD  Outpatient specialists:   LOS - 9  days   Medical records reviewed and are as summarized below:    Chief Complaint  Patient presents with   Abdominal Pain       Brief summary   Patient is 60 year old female, [name], ESRD, now status post renal transplant in 2005, hypothyroidism, gout, GERD, hyperlipidemia, diabetes mellitus type 2, SLE, DVT on warfarin presented from [facility] with abdominal pain and diarrhea for 5 days prior to admission.  Patient reported roughly 7 episodes in a day, watery, no hematochezia or melena.  Also reported generalized weakness, fatigue, no fevers or chills. C. difficile positive.  CT abdomen pelvis showed enteritis.  interval summary 8/24- 8/30 Patient was admitted with high suspicion for acute C. difficile colitis.  Initially treated with oral vancomycin with no significant improvement.  Continues to have nausea vomiting abdominal tenderness and diarrhea. -On 8/26, changed to Dificid and vancomycin enemas -GI has been following, as patient has persistent nausea, abdominal pain, abdominal tenderness and diarrhea.  Initial plan to pursue EGD and flex sig, INR was reversed however interventions placed on hold as patient started improving somewhat. -ID consulted, discontinued vancomycin enema -Day # 7, patient has not been able to consistently eat, will place on tube feeds per GI recommendations.   Assessment & Plan    Principal Problem: Non-fulminant severe acute C. difficile colitis with ileus, abdominal pain -Presented with profuse diarrhea, generalized weakness, abdominal pain - C. difficile PCR Ag + but  toxin negative.  High suspicion for C. difficile colitis, hence treating -No significant improvement with oral vancomycin. Changed to Dificid on 8/26 - Repeat CT abdomen on 8/25 did not show any bowel obstruction, perforation or toxic megacolon -GI, ID following.  -Patient was initially treated with Dificid for possible C. difficile, although with flex sig results not indicating any C. difficile, Dificid has been discontinued -Per GI, would consider mycophenolate as possible cause of her GI symptoms -Patient is followed at [facility] by nephrology.  I discussed with Dr. Leonides Schanz on-call for nephrology regarding possibly holding further mycophenolate -Recommendations from the nephrology team including hold further mycophenolate until she can follow-up in the office in the next few weeks -Review of previous GI records indicate that she did have improvement with Imodium in the past with diarrhea.  We will give a trial of Imodium. -We will continue to advance diet to soft diet   Active Problems: History of ESRD status post renal transplant, 2005 -Continue mycophenolate, tacrolimus, pharmacy following.  Tacrolimus level in therapeutic range. -Patient is on prednisone maintenance at 5 mg daily, currently on stress dose steroids, taper down to 12.5 mg twice daily  (continue until patient able to take p.o)  Essential hypertension, sinus tachycardia -Likely  due to dehydration from fluid loss with diarrhea, C. difficile colitis -Patient is now taking p.o. meds, will DC IV Cardizem drip, placed back on oral Cardizem per outpatient dose   History of DVT/PE/CVA -Coumadin was held for endoscopy, INR was reversed -cont heparin gtt, GI planning FFS today   GERD -Continue IV Pepcid  Hypothyroidism -Continue IV Synthroid 75 MCG daily, transition to p.o. when able -TSH 0.5   Diabetes mellitus, type II, IDDM -Hemoglobin A1c 8.2 -CBGs controlled  Hyperlipidemia -Continue  atorvastatin  Depression/anxiety -Continue sertraline  History of [name] -Declines any blood products if needed  Obesity Estimated body mass index is 36.56 kg/m as calculated from the following:   Height as of this encounter: 5\' 4"  (1.626 m).   Weight as of this encounter: 96.6 kg.  Code Status: Full CODE STATUS DVT Prophylaxis:    Resume warfarin/heparin bridge   Level of Care: Level of care: Progressive Family Communication: Left voicemail for patient's daughter  Disposition Plan:     Status is: Inpatient  Remains inpatient appropriate because:Inpatient level of care appropriate due to severity of illness  Dispo: The patient is from: SNF              Anticipated d/c is to: SNF              Patient currently is not medically stable to d/c.  Until consistently tolerating p.o. diet   Difficult to place patient No   Time Spent in minutes 25 minutes  Procedures:  None  Consultants:   Gastroenterology Infectious disease  Antimicrobials:   Anti-infectives (From admission, onward)    Start     Dose/Rate Route Frequency Ordered Stop   01/30/21 2200  fidaxomicin (DIFICID) tablet 200 mg  Status:  Discontinued        200 mg Oral 2 times daily 01/30/21 1832 02/05/21 0906   01/30/21 2000  vancomycin (VANCOCIN) 500 mg in sodium chloride irrigation 0.9 % 100 mL ENEMA  Status:  Discontinued        500 mg Rectal Every 6 hours 01/30/21 1838 02/02/21 1152   01/27/21 2200  cefTRIAXone (ROCEPHIN) 2 g in sodium chloride 0.9 % 100 mL IVPB  Status:  Discontinued        2 g 200 mL/hr over 30 Minutes Intravenous Every 24 hours 01/27/21 0956 01/27/21 1136   01/27/21 1200  vancomycin (VANCOCIN) capsule 125 mg  Status:  Discontinued        125 mg Oral 4 times daily 01/27/21 1135 01/30/21 1832   01/27/21 1000  metroNIDAZOLE (FLAGYL) IVPB 500 mg  Status:  Discontinued        500 mg 100 mL/hr over 60 Minutes Intravenous Every 12 hours 01/27/21 0901 01/27/21 1135   01/27/21 0115   cefTRIAXone (ROCEPHIN) 2 g in sodium chloride 0.9 % 100 mL IVPB        2 g 200 mL/hr over 30 Minutes Intravenous  Once 01/27/21 0103 01/27/21 0411   01/27/21 0115  metroNIDAZOLE (FLAGYL) IVPB 500 mg  Status:  Discontinued        500 mg 100 mL/hr over 60 Minutes Intravenous Every 8 hours 01/27/21 0103 01/27/21 0901          Medications  Scheduled Meds:  diltiazem  360 mg Oral Daily   donepezil  10 mg Oral QHS   feeding supplement (OSMOLITE 1.5 CAL)  1,000 mL Per Tube Q24H   feeding supplement (PROSource TF)  45 mL Per Tube TID  hydrocortisone sod succinate (SOLU-CORTEF) inj  12.5 mg Intravenous BID   insulin aspart  0-5 Units Subcutaneous QHS   insulin aspart  0-9 Units Subcutaneous TID WC   levothyroxine  150 mcg Oral Q0600   metoCLOPramide (REGLAN) injection  10 mg Intravenous Q6H   mirtazapine  30 mg Oral QHS   multivitamin with minerals  1 tablet Oral Daily   mycophenolate  250 mg Oral BID   tacrolimus  2.5 mg Oral Q12H   Warfarin - Pharmacist Dosing Inpatient   Does not apply q1600   Continuous Infusions:  dextrose 5 % and 0.45% NaCl 50 mL/hr at 02/05/21 0332   famotidine (PEPCID) IV 20 mg (02/05/21 1145)   heparin 1,000 Units/hr (02/05/21 1312)   promethazine (PHENERGAN) injection (IM or IVPB) 25 mg (02/05/21 1715)   PRN Meds:.acetaminophen **OR** acetaminophen, fentaNYL (SUBLIMAZE) injection, hydrALAZINE, promethazine (PHENERGAN) injection (IM or IVPB)      Subjective:   Connie Ruiz was seen and examined today.  She reports continued pain in her abdomen.  She reports continued loose stools.  P.o. intake remains poor.   Objective:   Vitals:   02/04/21 1539 02/04/21 1549 02/05/21 1126 02/05/21 1218  BP: (!) 165/90 (!) 171/90 138/79 (!) 151/94  Pulse: (!) 106 (!) 102    Resp: 13 13    Temp:    98.2 F (36.8 C)  TempSrc:      SpO2: 97% 97%  100%  Weight:      Height:        Intake/Output Summary (Last 24 hours) at 02/05/2021 1936 Last data filed at  02/05/2021 1715 Gross per 24 hour  Intake --  Output 700 ml  Net -700 ml     Wt Readings from Last 3 Encounters:  02/03/21 96.6 kg  08/21/20 88.9 kg  09/11/19 78.9 kg    Physical Exam General exam: Alert, awake, oriented x 3 Respiratory system: Clear to auscultation. Respiratory effort normal. Cardiovascular system:RRR. No murmurs, rubs, gallops. Gastrointestinal system: Abdomen is nondistended, soft and diffusely tender to palpation. No organomegaly or masses felt. Normal bowel sounds heard. Central nervous system: Alert and oriented. No focal neurological deficits. Extremities: No C/C/E, +pedal pulses Skin: No rashes, lesions or ulcers Psychiatry: Judgement and insight appear normal. Mood & affect appropriate.    Data Reviewed:  I have personally reviewed following labs and imaging studies  Micro Results Recent Results (from the past 240 hour(s))  Resp Panel by RT-PCR (Flu A&B, Covid) Nasopharyngeal Swab     Status: None   Collection Time: 01/26/21  9:56 PM   Specimen: Nasopharyngeal Swab; Nasopharyngeal(NP) swabs in vial transport medium  Result Value Ref Range Status   SARS Coronavirus 2 by RT PCR NEGATIVE NEGATIVE Final    Comment: (NOTE) SARS-CoV-2 target nucleic acids are NOT DETECTED.  The SARS-CoV-2 RNA is generally detectable in upper respiratory specimens during the acute phase of infection. The lowest concentration of SARS-CoV-2 viral copies this assay can detect is 138 copies/mL. A negative result does not preclude SARS-Cov-2 infection and should not be used as the sole basis for treatment or other patient management decisions. A negative result may occur with  improper specimen collection/handling, submission of specimen other than nasopharyngeal swab, presence of viral mutation(s) within the areas targeted by this assay, and inadequate number of viral copies(<138 copies/mL). A negative result must be combined with clinical observations, patient history, and  epidemiological information. The expected result is Negative.  Fact Sheet for Patients:  EntrepreneurPulse.com.au  Fact  Sheet for Healthcare Providers:  IncredibleEmployment.be  This test is no t yet approved or cleared by the [place] FDA and  has been authorized for detection and/or diagnosis of SARS-CoV-2 by FDA under an Emergency Use Authorization (EUA). This EUA will remain  in effect (meaning this test can be used) for the duration of the COVID-19 declaration under Section 564(b)(1) of the Act, 21 U.S.C.section 360bbb-3(b)(1), unless the authorization is terminated  or revoked sooner.       Influenza A by PCR NEGATIVE NEGATIVE Final   Influenza B by PCR NEGATIVE NEGATIVE Final    Comment: (NOTE) The Xpert Xpress SARS-CoV-2/FLU/RSV plus assay is intended as an aid in the diagnosis of influenza from Nasopharyngeal swab specimens and should not be used as a sole basis for treatment. Nasal washings and aspirates are unacceptable for Xpert Xpress SARS-CoV-2/FLU/RSV testing.  Fact Sheet for Patients: EntrepreneurPulse.com.au  Fact Sheet for Healthcare Providers: IncredibleEmployment.be  This test is not yet approved or cleared by the [place] FDA and has been authorized for detection and/or diagnosis of SARS-CoV-2 by FDA under an Emergency Use Authorization (EUA). This EUA will remain in effect (meaning this test can be used) for the duration of the COVID-19 declaration under Section 564(b)(1) of the Act, 21 U.S.C. section 360bbb-3(b)(1), unless the authorization is terminated or revoked.  Performed at [facility], [place] [address], [place], [place] [address]   Blood culture (routine x 2)     Status: None   Collection Time: 01/27/21 12:32 AM   Specimen: Left Antecubital; Blood  Result Value Ref Range Status   Specimen Description   Final    LEFT ANTECUBITAL Performed at  [facility] [address] [place], [place] [address]    Special Requests   Final    BOTTLES DRAWN AEROBIC AND ANAEROBIC Blood Culture adequate volume Performed at [facility] [address] [place], [place] [address]    Culture   Final    NO GROWTH 5 DAYS Performed at [facility], [place] [address] [place], [place] [address]    Report Status 02/01/2021 FINAL  Final  Blood culture (routine x 2)     Status: None   Collection Time: 01/27/21  3:21 AM   Specimen: Left Antecubital; Blood  Result Value Ref Range Status   Specimen Description   Final    LEFT ANTECUBITAL Performed at [facility] [address], [place], [place] [address]    Special Requests   Final    BOTTLES DRAWN AEROBIC AND ANAEROBIC Blood Culture results may not be optimal due to an excessive volume of blood received in culture bottles Performed at [facility] [address], [place], [place] [address]    Culture   Final    NO GROWTH 5 DAYS Performed at [facility], [place] [address], [place], [place] [address]    Report Status 02/01/2021 FINAL  Final  Gastrointestinal Panel by PCR , Stool     Status: None   Collection Time: 01/27/21  6:33 AM   Specimen: Stool  Result Value Ref Range Status   Campylobacter species NOT DETECTED NOT DETECTED Final   Plesimonas shigelloides NOT DETECTED NOT DETECTED Final   Salmonella species NOT DETECTED NOT DETECTED Final   Yersinia enterocolitica NOT DETECTED NOT DETECTED Final   Vibrio species NOT DETECTED NOT DETECTED Final   Vibrio cholerae NOT DETECTED NOT DETECTED Final   Enteroaggregative E coli (EAEC)  NOT DETECTED NOT DETECTED Final   Enteropathogenic E coli (EPEC) NOT DETECTED NOT DETECTED Final   Enterotoxigenic E coli (ETEC) NOT DETECTED NOT DETECTED Final   Shiga like toxin producing E coli (STEC) NOT DETECTED NOT DETECTED Final   Shigella/Enteroinvasive E coli (EIEC) NOT  DETECTED NOT DETECTED Final   Cryptosporidium NOT DETECTED NOT DETECTED Final   Cyclospora cayetanensis NOT DETECTED NOT DETECTED Final   Entamoeba histolytica NOT DETECTED NOT DETECTED Final   Giardia lamblia NOT DETECTED NOT DETECTED Final   Adenovirus F40/41 NOT DETECTED NOT DETECTED Final   Astrovirus NOT DETECTED NOT DETECTED Final   Norovirus GI/GII NOT DETECTED NOT DETECTED Final   Rotavirus A NOT DETECTED NOT DETECTED Final   Sapovirus (I, II, IV, and V) NOT DETECTED NOT DETECTED Final    Comment: Performed at [facility], [place] [place], [place] [address]  C Difficile Quick Screen w PCR reflex     Status: Abnormal   Collection Time: 01/27/21  6:33 AM   Specimen: Stool  Result Value Ref Range Status   C Diff antigen POSITIVE (A) NEGATIVE Final   C Diff toxin NEGATIVE NEGATIVE Final   C Diff interpretation Results are indeterminate. See PCR results.  Final    Comment: Performed at [facility], [place] [address], [place], [place] [address]  C. Diff by PCR, Reflexed     Status: Abnormal   Collection Time: 01/27/21  6:33 AM  Result Value Ref Range Status   Toxigenic C. Difficile by PCR POSITIVE (A) NEGATIVE Final    Comment: Positive for toxigenic C. difficile with little to no toxin production. Only treat if clinical presentation suggests symptomatic illness. Performed at [facility], [place] [address], [place], [place] [address]   Culture, blood (routine x 2)     Status: None   Collection Time: 01/30/21  7:11 PM   Specimen: BLOOD  Result Value Ref Range Status   Specimen Description   Final    BLOOD BLOOD LEFT HAND Performed at [facility] [address] [place], [place] [address]    Special Requests   Final    BOTTLES DRAWN AEROBIC ONLY Blood Culture adequate volume Performed at [facility] [address], [place], [place] [address]    Culture   Final    NO GROWTH 5 DAYS Performed at  [facility], [place] [address], [place], [place] [address]    Report Status 02/05/2021 FINAL  Final  Culture, blood (routine x 2)     Status: None   Collection Time: 01/30/21  7:11 PM   Specimen: BLOOD  Result Value Ref Range Status   Specimen Description   Final    BLOOD LEFT WRIST Performed at [facility] [address] [place], [place] [address]    Special Requests   Final    BOTTLES DRAWN AEROBIC ONLY Blood Culture adequate volume Performed at [facility] [address], [place], [place] [address]    Culture   Final    NO GROWTH 5 DAYS Performed at [facility], [place] [address], [place], [place] [address]    Report Status 02/05/2021 FINAL  Final    Radiology Reports CT ABDOMEN PELVIS WO CONTRAST  Result Date: 01/29/2021 CLINICAL DATA:  Bowel obstruction suspected. Abdominal pain, acute, nonlocalized. End-stage renal disease with previous renal transplant. EXAM: CT ABDOMEN AND PELVIS WITHOUT CONTRAST TECHNIQUE: Multidetector CT imaging of the abdomen  and pelvis was performed following the standard protocol without IV contrast. COMPARISON:  01/26/2021 FINDINGS: Lower chest: Elevation of the right hemidiaphragm with mild atelectasis at the right lung base, similar to the previous study. Extensive coronary artery calcification. Hepatobiliary: Previous cholecystectomy.  No focal liver lesion. Pancreas: Normal Spleen: Normal Adrenals/Urinary Tract: Adrenal glands are normal. Markedly atrophic native kidneys without significant finding. Small cysts as seen previously. Transplant kidney in the right iliac fossa without abnormal finding by CT. Bladder appears normal. Stomach/Bowel: Stomach and small intestine are normal. Diverticulosis of the colon but no evidence of diverticulitis. No bowel obstruction or focal bowel lesion. Vascular/Lymphatic: Aortic atherosclerosis. No aneurysm. IVC is normal. No retroperitoneal adenopathy. Reproductive: No  pelvic mass. Other: No free fluid or air. Musculoskeletal: No significant skeletal finding. IMPRESSION: No bowel obstruction or acute bowel pathology. Diverticulosis without evidence of diverticulitis. Elevation of the right hemidiaphragm with atelectasis at the right lung base. Atrophic native kidneys. Grossly normal appearing transplant kidney in the right iliac fossa. Aortic atherosclerosis. Widespread vascular calcification consistent with chronic renal failure. Electronically Signed   By: Nelson Chimes M.D.   On: 01/29/2021 16:10   CT ABDOMEN PELVIS WO CONTRAST  Result Date: 01/26/2021 CLINICAL DATA:  Concern for bowel obstruction. EXAM: CT ABDOMEN AND PELVIS WITHOUT CONTRAST TECHNIQUE: Multidetector CT imaging of the abdomen and pelvis was performed following the standard protocol without IV contrast. COMPARISON:  CT abdomen pelvis dated 08/21/2020. FINDINGS: Evaluation of this exam is limited in the absence of intravenous contrast. Lower chest: Minimal bibasilar atelectasis. The visualized lung bases are otherwise clear. There is advanced 3 vessel coronary vascular calcification. No intra-abdominal free air or free fluid. Hepatobiliary: The liver is unremarkable. No intrahepatic biliary dilatation. Cholecystectomy. No retained calcified stone noted in the central CBD. Pancreas: Unremarkable. No pancreatic ductal dilatation or surrounding inflammatory changes. Spleen: Normal in size without focal abnormality. Adrenals/Urinary Tract: The adrenal glands are unremarkable. Atrophic native kidneys. Bilateral renal parenchyma nodularity similar to prior CT may represent residual renal parenchyma. A mass is less likely but not excluded. The visualized ureters appear unremarkable. The urinary bladder is collapsed. There is a right lower quadrant renal transplant. There is no hydronephrosis or nephrolithiasis of the transplant kidney. No peritransplant fluid collection. Stomach/Bowel: There is diffuse colonic  diverticulosis without active inflammatory changes. There is mild thickened appearance of the jejunal folds with engorgement of the associated mesentery. Clinical correlation is recommended to evaluate for possibility of enteritis. There is no bowel obstruction. The appendix is normal. Vascular/Lymphatic: Moderate aortoiliac atherosclerotic disease. The IVC is unremarkable. No portal venous gas. There is no adenopathy. Reproductive: The uterus is grossly unremarkable. No adnexal masses. Other: Mild diffuse subcutaneous stranding.  No fluid collection. Musculoskeletal: Osteopenia with degenerative changes of the spine. Multiple old left rib fractures. No acute osseous pathology. IMPRESSION: 1. Mild thickened appearance of the jejunal folds with engorgement of the associated mesentery. Clinical correlation is recommended to evaluate for possibility of enteritis. No bowel obstruction. Normal appendix. 2. Colonic diverticulosis. 3. Atrophic native kidneys with a right lower quadrant renal transplant. No hydronephrosis or nephrolithiasis. 4. Aortic Atherosclerosis (ICD10-I70.0). Electronically Signed   By: Anner Crete M.D.   On: 01/26/2021 20:23   DG Chest 2 View  Result Date: 01/26/2021 CLINICAL DATA:  Lower abdominal pain since Friday, tachycardia EXAM: CHEST - 2 VIEW COMPARISON:  08/28/2020 FINDINGS: Frontal and lateral views of the chest demonstrate an unremarkable cardiac silhouette. No acute airspace disease, effusion, or pneumothorax. No acute bony abnormalities. IMPRESSION:  1. No acute intrathoracic process. Electronically Signed   By: Randa Ngo M.D.   On: 01/26/2021 22:17   DG Abd 2 Views  Result Date: 01/29/2021 CLINICAL DATA:  Abdominal pain EXAM: ABDOMEN - 2 VIEW COMPARISON:  None. FINDINGS: There is mildly dilated of the cecum. Nondistended small bowel. No acute osseous abnormality. There are soft tissue calcifications consistent with subcutaneous calcification seen on prior CT. IMPRESSION:  Mild dilation of the cecum. If there is concern for obstruction recommend CT. Electronically Signed   By: Maurine Simmering M.D.   On: 01/29/2021 09:25   DG Abd Portable 1V  Result Date: 02/03/2021 CLINICAL DATA:  Nasogastric tube placement. EXAM: PORTABLE ABDOMEN - 1 VIEW COMPARISON:  X-ray abdomen 01/29/2021, CT abdomen pelvis 01/29/2021 FINDINGS: Enteric tube with tip overlying the expected region of the pyloric region/first portion of the duodenum. The bowel gas pattern is normal. Status post cholecystectomy. No radio-opaque calculi or other significant radiographic abnormality are seen. Aortic atherosclerosis. IMPRESSION: 1. Enteric tube with tip overlying the expected region of the pyloric region/first portion of the duodenum. 2. Aortic Atherosclerosis (ICD10-I70.0). Electronically Signed   By: Iven Finn M.D.   On: 02/03/2021 16:22   ECHOCARDIOGRAM COMPLETE  Result Date: 02/03/2021    ECHOCARDIOGRAM REPORT   Patient Name:   COSANDRA PLOUFFE Date of Exam: 02/03/2021 Medical Rec #:  [id number]    Height:       64.0 in Accession #:    [id number]   Weight:       213.0 lb Date of Birth:  [birth year]    BSA:          2.009 m Patient Age:    49 years     BP:           163/66 mmHg Patient Gender: F            HR:           89 bpm. Exam Location:  Inpatient Procedure: 2D Echo, Cardiac Doppler and Color Doppler Indications:    R01.1 Murmur  History:        Patient has prior history of Echocardiogram examinations, most                 recent 08/14/2018. CHF, Arrythmias:Tachycardia; Risk                 Factors:Hypertension, Diabetes and Dyslipidemia.  Sonographer:    MH Referring Phys: [place]  1. Left ventricular ejection fraction, by estimation, is 65 to 70%. The left ventricle has normal function. The left ventricle has no regional wall motion abnormalities. There is mild concentric left ventricular hypertrophy. Left ventricular diastolic parameters are consistent with Grade I diastolic  dysfunction (impaired relaxation).  2. Right ventricular systolic function is normal. The right ventricular size is normal.  3. The mitral valve is normal in structure. Mild mitral valve regurgitation. No evidence of mitral stenosis.  4. The aortic valve is normal in structure. Aortic valve regurgitation is not visualized. Mild aortic valve sclerosis is present, with no evidence of aortic valve stenosis.  5. The inferior vena cava is normal in size with <50% respiratory variability, suggesting right atrial pressure of 8 mmHg. FINDINGS  Left Ventricle: Left ventricular ejection fraction, by estimation, is 65 to 70%. The left ventricle has normal function. The left ventricle has no regional wall motion abnormalities. The left ventricular internal cavity size was normal in size. There is  mild concentric left ventricular  hypertrophy. Left ventricular diastolic parameters are consistent with Grade I diastolic dysfunction (impaired relaxation). Indeterminate filling pressures. Right Ventricle: The right ventricular size is normal. No increase in right ventricular wall thickness. Right ventricular systolic function is normal. Left Atrium: Left atrial size was normal in size. Right Atrium: Right atrial size was normal in size. Pericardium: There is no evidence of pericardial effusion. Mitral Valve: The mitral valve is normal in structure. Mild to moderate mitral annular calcification. Mild mitral valve regurgitation. No evidence of mitral valve stenosis. Tricuspid Valve: The tricuspid valve is normal in structure. Tricuspid valve regurgitation is not demonstrated. No evidence of tricuspid stenosis. Aortic Valve: The aortic valve is normal in structure. Aortic valve regurgitation is not visualized. Mild aortic valve sclerosis is present, with no evidence of aortic valve stenosis. Aortic valve mean gradient measures 4.0 mmHg. Aortic valve peak gradient measures 7.4 mmHg. Aortic valve area, by VTI measures 3.13 cm. Pulmonic  Valve: The pulmonic valve was normal in structure. Pulmonic valve regurgitation is not visualized. No evidence of pulmonic stenosis. Aorta: The aortic root is normal in size and structure. Venous: The inferior vena cava is normal in size with less than 50% respiratory variability, suggesting right atrial pressure of 8 mmHg. IAS/Shunts: No atrial level shunt detected by color flow Doppler.  LEFT VENTRICLE PLAX 2D LVIDd:         4.00 cm     Diastology LVIDs:         2.70 cm     LV e' medial:    4.57 cm/s LV PW:         1.30 cm     LV E/e' medial:  14.7 LV IVS:        1.70 cm     LV e' lateral:   5.00 cm/s LVOT diam:     2.20 cm     LV E/e' lateral: 13.5 LV SV:         68 LV SV Index:   34 LVOT Area:     3.80 cm  LV Volumes (MOD) LV vol d, MOD A4C: 26.9 ml LV vol s, MOD A4C: 12.0 ml LV SV MOD A4C:     26.9 ml RIGHT VENTRICLE             IVC RV S prime:     10.80 cm/s  IVC diam: 1.70 cm TAPSE (M-mode): 1.6 cm LEFT ATRIUM           Index       RIGHT ATRIUM          Index LA diam:      2.00 cm 1.00 cm/m  RA Area:     7.00 cm LA Vol (A2C): 36.3 ml 18.07 ml/m RA Volume:   12.40 ml 6.17 ml/m LA Vol (A4C): 14.0 ml 6.97 ml/m  AORTIC VALVE AV Area (Vmax):    3.21 cm AV Area (Vmean):   2.97 cm AV Area (VTI):     3.13 cm AV Vmax:           136.00 cm/s AV Vmean:          93.700 cm/s AV VTI:            0.216 m AV Peak Grad:      7.4 mmHg AV Mean Grad:      4.0 mmHg LVOT Vmax:         115.00 cm/s LVOT Vmean:        73.300 cm/s LVOT VTI:  0.178 m LVOT/AV VTI ratio: 0.82  AORTA Ao Root diam: 3.10 cm Ao Asc diam:  2.90 cm MITRAL VALVE MV Area (PHT): 3.67 cm     SHUNTS MV E velocity: 67.28 cm/s   Systemic VTI:  0.18 m MV A velocity: 158.00 cm/s  Systemic Diam: 2.20 cm MV E/A ratio:  0.43 Mihai Croitoru MD Electronically signed by Sanda Klein MD Signature Date/Time: 02/03/2021/4:54:42 PM    Final     Lab Data:  CBC: Recent Labs  Lab 02/01/21 0618 02/02/21 0356 02/03/21 0505 02/04/21 0505 02/04/21 1446  02/05/21 0127  WBC 10.7* 10.0 9.1 12.5*  --  9.9  HGB 11.8* 12.6 11.3* 11.8* 12.6 10.4*  HCT 36.7 39.6 36.0 36.9 37.0 33.6*  MCV 85.2 85.9 87.0 84.8  --  87.0  PLT 276 190 248 262  --  024   Basic Metabolic Panel: Recent Labs  Lab 01/30/21 0526 01/31/21 0713 02/01/21 0618 02/03/21 1711 02/04/21 0505 02/04/21 1446 02/05/21 0127  NA 139 140 140  --  142 138 137  K 3.3* 3.6 3.6  --  2.7* 3.2* 3.2*  CL 112* 111 112*  --  107 101 102  CO2 22 19* 23  --  30  --  26  GLUCOSE 79 205* 197*  --  170* 172* 242*  BUN <5* 7 8  --  <5* <3* 5*  CREATININE 0.40* 0.61 0.45  --  0.44 0.40* 0.53  CALCIUM 8.1* 8.6* 8.7*  --  8.9  --  8.3*  MG 1.1* 2.0  --  1.3* 1.2*  --  1.6*  PHOS  --   --   --  1.6* 2.2*  --  2.5   GFR: Estimated Creatinine Clearance: 84.4 mL/min (by C-G formula based on SCr of 0.53 mg/dL). Liver Function Tests: Recent Labs  Lab 02/01/21 0618  AST 12*  ALT 10  ALKPHOS 47  BILITOT 0.4  PROT 6.1*  ALBUMIN 2.9*   No results for input(s): LIPASE, AMYLASE in the last 168 hours.  No results for input(s): AMMONIA in the last 168 hours. Coagulation Profile: Recent Labs  Lab 02/01/21 0535 02/02/21 0356 02/03/21 0505 02/04/21 0505 02/05/21 0127  INR 1.3* 1.2 1.2 1.2 1.2   Cardiac Enzymes: No results for input(s): CKTOTAL, CKMB, CKMBINDEX, TROPONINI in the last 168 hours. BNP (last 3 results) No results for input(s): PROBNP in the last 8760 hours. HbA1C: No results for input(s): HGBA1C in the last 72 hours.  CBG: Recent Labs  Lab 02/05/21 0137 02/05/21 0600 02/05/21 0735 02/05/21 1137 02/05/21 1625  GLUCAP 228* 257* 233* 160* 237*   Lipid Profile: No results for input(s): CHOL, HDL, LDLCALC, TRIG, CHOLHDL, LDLDIRECT in the last 72 hours. Thyroid Function Tests: No results for input(s): TSH, T4TOTAL, FREET4, T3FREE, THYROIDAB in the last 72 hours.  Anemia Panel: No results for input(s): VITAMINB12, FOLATE, FERRITIN, TIBC, IRON, RETICCTPCT in the last  72 hours. Urine analysis:    Component Value Date/Time   COLORURINE YELLOW 01/26/2021 2156   APPEARANCEUR CLEAR 01/26/2021 2156   LABSPEC >1.030 (H) 01/26/2021 2156   PHURINE 6.0 01/26/2021 2156   GLUCOSEU NEGATIVE 01/26/2021 2156   GLUCOSEU NEGATIVE 10/22/2013 1621   HGBUR NEGATIVE 01/26/2021 2156   BILIRUBINUR MODERATE (A) 01/26/2021 2156   KETONESUR 40 (A) 01/26/2021 2156   PROTEINUR TRACE (A) 01/26/2021 2156   UROBILINOGEN 1.0 03/27/2015 1534   NITRITE NEGATIVE 01/26/2021 2156   LEUKOCYTESUR NEGATIVE 01/26/2021 2156     Kathie Dike M.D. [facility] 02/05/2021, 7:36 PM  Available via Epic secure chat 7am-7pm After 7 pm, please refer to night coverage provider listed on amion.

## 2021-02-05 NOTE — Progress Notes (Signed)
Subjective: Still having diarrhea.  Objective: Vital signs in last 24 hours: Temp:  [98.2 F (36.8 C)] 98.2 F (36.8 C) (09/01 1218) BP: (138-151)/(79-94) 151/94 (09/01 1218) SpO2:  [100 %] 100 % (09/01 1218) Last BM Date: 02/04/21  Intake/Output from previous day: 08/31 0701 - 09/01 0700 In: 1199.5 [I.V.:541.4; NG/GT:21.7; IV Piggyback:636.5] Out: 1350 [Urine:1350] Intake/Output this shift: Total I/O In: -  Out: 400 [Urine:400]  General appearance: alert and weak appearing GI: soft, non-tender; bowel sounds normal; no masses,  no organomegaly  Lab Results: Recent Labs    02/03/21 0505 02/04/21 0505 02/04/21 1446 02/05/21 0127  WBC 9.1 12.5*  --  9.9  HGB 11.3* 11.8* 12.6 10.4*  HCT 36.0 36.9 37.0 33.6*  PLT 248 262  --  224   BMET Recent Labs    02/04/21 0505 02/04/21 1446 02/05/21 0127  NA 142 138 137  K 2.7* 3.2* 3.2*  CL 107 101 102  CO2 30  --  26  GLUCOSE 170* 172* 242*  BUN <5* <3* 5*  CREATININE 0.44 0.40* 0.53  CALCIUM 8.9  --  8.3*   LFT No results for input(s): PROT, ALBUMIN, AST, ALT, ALKPHOS, BILITOT, BILIDIR, IBILI in the last 72 hours. PT/INR Recent Labs    02/04/21 0505 02/05/21 0127  LABPROT 14.9 15.1  INR 1.2 1.2   Hepatitis Panel No results for input(s): HEPBSAG, HCVAB, HEPAIGM, HEPBIGM in the last 72 hours. C-Diff No results for input(s): CDIFFTOX in the last 72 hours. Fecal Lactopherrin No results for input(s): FECLLACTOFRN in the last 72 hours.  Studies/Results: No results found.  Medications: Scheduled:  diltiazem  360 mg Oral Daily   donepezil  10 mg Oral QHS   feeding supplement (OSMOLITE 1.5 CAL)  1,000 mL Per Tube Q24H   feeding supplement (PROSource TF)  45 mL Per Tube TID   hydrocortisone sod succinate (SOLU-CORTEF) inj  12.5 mg Intravenous BID   insulin aspart  0-5 Units Subcutaneous QHS   insulin aspart  0-9 Units Subcutaneous TID WC   levothyroxine  150 mcg Oral Q0600   metoCLOPramide (REGLAN) injection   10 mg Intravenous Q6H   mirtazapine  30 mg Oral QHS   multivitamin with minerals  1 tablet Oral Daily   mycophenolate  250 mg Oral BID   tacrolimus  2.5 mg Oral Q12H   Warfarin - Pharmacist Dosing Inpatient   Does not apply q1600   Continuous:  dextrose 5 % and 0.45% NaCl 50 mL/hr at 02/05/21 0332   famotidine (PEPCID) IV 20 mg (02/05/21 1145)   heparin 1,000 Units/hr (02/05/21 1312)   promethazine (PHENERGAN) injection (IM or IVPB) 25 mg (02/05/21 1715)    Assessment/Plan: 1) Chronic diarrhea. 2) S/p renal transplant.   The biopsies from the [facility] are negative for any pathology.  Her diarrhea may be from her mycophenolate.  It will be a good idea to decrease the dose or stop the medication, if possible to determine if the diarrhea can improve.  Plan: 1) Hold or dose reduce mycophenolate, if possible.  LOS: 9 days   Connie Ruiz 02/05/2021, 5:54 PM

## 2021-02-05 NOTE — Progress Notes (Signed)
ANTICOAGULATION CONSULT NOTE - follow up  Pharmacy Consult for Heparin & warfarin Indication: H/O DVT/PE  Patient Measurements: Height: 5\' 4"  (115.7 cm) Weight: 96.6 kg (212 lb 15.4 oz) IBW/kg (Calculated) : 54.7 Heparin Dosing Weight: 76 kg  Medications:  Warfarin PTA dosing of 6.5 mg daily  Assessment: 60 y/o F [name] on warfarin PTA for a h/o DVT/PE. Pharmacy consulted to dose heparin until patient consistently tolerating PO. Patient did receive vitamin K 2 mg iv on 8/27.  Warfarin resumed 8/23, no Warfarin 8/24, received 3mg  8/25 > held. Order to resume Warfarin 8/30 > discontinued  8/31 Flex sig at 3pm, heparin stopped 11am.  Heparin and Warfarin resumed after procedure  Today, 02/05/2021 1044 Hep level 0.33 units/ml, low therapeutic range CBC- Hg slightly low at 10.4, pltc WNL Scr 0.53 No bleeding or infusion related concerns per RN  Goal of Therapy:  Heparin level 0.3-0.7 units/ml Monitor platelets by anticoagulation protocol: Yes   Plan:  Continue Heparin at 1000 units/hr  Warfarin 6mg  today at 1600 Daily CBC, Heparin level & INR  [name] PharmD [facility] [phone] 02/05/2021, 8:27 AM

## 2021-02-06 ENCOUNTER — Encounter (HOSPITAL_COMMUNITY): Payer: Self-pay | Admitting: Gastroenterology

## 2021-02-06 DIAGNOSIS — I5032 Chronic diastolic (congestive) heart failure: Secondary | ICD-10-CM | POA: Diagnosis not present

## 2021-02-06 DIAGNOSIS — I825Z9 Chronic embolism and thrombosis of unspecified deep veins of unspecified distal lower extremity: Secondary | ICD-10-CM | POA: Diagnosis not present

## 2021-02-06 DIAGNOSIS — K529 Noninfective gastroenteritis and colitis, unspecified: Secondary | ICD-10-CM | POA: Diagnosis not present

## 2021-02-06 DIAGNOSIS — A0472 Enterocolitis due to Clostridium difficile, not specified as recurrent: Secondary | ICD-10-CM | POA: Diagnosis not present

## 2021-02-06 LAB — BASIC METABOLIC PANEL
Anion gap: 4 — ABNORMAL LOW (ref 5–15)
BUN: 15 mg/dL (ref 6–20)
CO2: 29 mmol/L (ref 22–32)
Calcium: 9.2 mg/dL (ref 8.9–10.3)
Chloride: 104 mmol/L (ref 98–111)
Creatinine, Ser: 0.53 mg/dL (ref 0.44–1.00)
GFR, Estimated: >60 mL/min (ref >60)
Glucose, Bld: 315 mg/dL — ABNORMAL HIGH (ref 70–99)
Potassium: 4.2 mmol/L (ref 3.5–5.1)
Sodium: 137 mmol/L (ref 135–145)

## 2021-02-06 LAB — MAGNESIUM: Magnesium: 1.7 mg/dL (ref 1.7–2.4)

## 2021-02-06 LAB — CBC
HCT: 31.7 % — ABNORMAL LOW (ref 36.0–46.0)
Hemoglobin: 9.9 g/dL — ABNORMAL LOW (ref 12.0–15.0)
MCH: 27.1 pg (ref 26.0–34.0)
MCHC: 31.2 g/dL (ref 30.0–36.0)
MCV: 86.8 fL (ref 80.0–100.0)
Platelets: 216 10*3/uL (ref 150–400)
RBC: 3.65 MIL/uL — ABNORMAL LOW (ref 3.87–5.11)
RDW: 15.9 % — ABNORMAL HIGH (ref 11.5–15.5)
WBC: 10.5 10*3/uL (ref 4.0–10.5)
nRBC: 0 % (ref 0.0–0.2)

## 2021-02-06 LAB — HEPARIN LEVEL (UNFRACTIONATED)
Heparin Unfractionated: 0.13 IU/mL — ABNORMAL LOW (ref 0.30–0.70)
Heparin Unfractionated: 0.47 IU/mL (ref 0.30–0.70)
Heparin Unfractionated: 0.25 IU/mL — ABNORMAL LOW (ref 0.30–0.70)

## 2021-02-06 LAB — GLUCOSE, CAPILLARY
Glucose-Capillary: 297 mg/dL — ABNORMAL HIGH (ref 70–99)
Glucose-Capillary: 284 mg/dL — ABNORMAL HIGH (ref 70–99)
Glucose-Capillary: 198 mg/dL — ABNORMAL HIGH (ref 70–99)
Glucose-Capillary: 207 mg/dL — ABNORMAL HIGH (ref 70–99)
Glucose-Capillary: 125 mg/dL — ABNORMAL HIGH (ref 70–99)

## 2021-02-06 LAB — PROTIME-INR
INR: 1.3 — ABNORMAL HIGH (ref 0.8–1.2)
Prothrombin Time: 15.7 seconds — ABNORMAL HIGH (ref 11.4–15.2)

## 2021-02-06 MED ORDER — FENTANYL CITRATE PF 50 MCG/ML IJ SOSY
25.0000 ug | PREFILLED_SYRINGE | INTRAMUSCULAR | Status: DC | PRN
Start: 2021-02-06 — End: 2021-02-11
  Administered 2021-02-06 – 2021-02-11 (×9): 25 ug via INTRAVENOUS
  Filled 2021-02-06 (×9): qty 1

## 2021-02-06 MED ORDER — WARFARIN SODIUM 6 MG PO TABS
6.0000 mg | ORAL_TABLET | Freq: Once | ORAL | Status: AC
  Administered 2021-02-06: 6 mg via ORAL
  Filled 2021-02-06: qty 1

## 2021-02-06 MED ORDER — DILTIAZEM HCL 25 MG/5ML IV SOLN
10.0000 mg | Freq: Four times a day (QID) | INTRAVENOUS | Status: DC | PRN
  Filled 2021-02-06: qty 5

## 2021-02-06 MED ORDER — MORPHINE SULFATE 15 MG PO TABS
15.0000 mg | ORAL_TABLET | ORAL | Status: DC | PRN
Start: 2021-02-06 — End: 2021-02-14
  Administered 2021-02-06 – 2021-02-13 (×23): 15 mg via ORAL
  Filled 2021-02-06 (×23): qty 1

## 2021-02-06 MED ORDER — NUTRISOURCE FIBER PO PACK
1.0000 | PACK | Freq: Two times a day (BID) | ORAL | Status: DC
  Administered 2021-02-08 – 2021-02-13 (×5): 1 via ORAL
  Filled 2021-02-06 (×16): qty 1

## 2021-02-06 MED ORDER — CARVEDILOL 6.25 MG PO TABS
6.2500 mg | ORAL_TABLET | Freq: Two times a day (BID) | ORAL | Status: DC
  Administered 2021-02-07 – 2021-02-13 (×14): 6.25 mg via ORAL
  Filled 2021-02-06 (×14): qty 1

## 2021-02-06 NOTE — Consult Note (Signed)
[facility] CM Inpatient Consult   02/06/2021  DONNAMAE MUILENBURG January 04, 1961 [id number]  [place]  Accountable Care Organization [ACO] Patient: [facility] Medicare   Patient screened for hospitalization with noted extreme risk score for unplanned readmission risk to assess for potential [facility] CM) service needs.  Review of patient's medical record reveals patient is from [facility] and current disposition plan is for return to SNF. No [facility] CM needs.  Netta Cedars, MSN, [facility] Liaison Nurse Mobile Phone [phone]  Toll free office [phone]

## 2021-02-06 NOTE — Progress Notes (Signed)
ANTICOAGULATION CONSULT NOTE - follow up  Pharmacy Consult for Heparin & warfarin Indication: H/O DVT/PE  Patient Measurements: Height: 5\' 4"  (162.6 cm) Weight: 96.6 kg (212 lb 15.4 oz) IBW/kg (Calculated) : 54.7 Heparin Dosing Weight: 76 kg  Medications:  Warfarin PTA dosing of 6.5 mg daily  Assessment: 60 y/o F [name] on warfarin PTA for a h/o DVT/PE. Pharmacy consulted to dose heparin until patient consistently tolerating PO. Patient did receive vitamin K 2 mg iv on 8/27.  Warfarin resumed 8/23, no Warfarin 8/24, received 3mg  8/25 > held. Order to resume Warfarin 8/30 > discontinued  8/31 Flex sig at 3pm, heparin stopped 11am.  Heparin and Warfarin resumed after procedure  Today, 02/06/2021 1416 heparin level = 0.47 units/mL, now therapeutic after rate increase this AM CBC- Hgb low at 9.9, Pltc WNL INR = 1.3, subtherapeutic  SCr WNL No bleeding or infusion issues noted per nursing   Goal of Therapy:  Heparin level 0.3-0.7 units/ml INR 2-3 Monitor platelets by anticoagulation protocol: Yes   Plan:  Continue heparin infusion at 1150 units/hr  Check heparin level in 6 hours to ensure remains within therapeutic range  Warfarin 6mg  PO x 1 today at 1600 as previously ordered Daily CBC, heparin level, PT/INR Monitor closely for s/sx of bleeding   Lindell Spar, PharmD, BCPS Clinical Pharmacist  02/06/2021, 3:35 PM

## 2021-02-06 NOTE — Progress Notes (Signed)
ANTICOAGULATION CONSULT NOTE - follow up  Pharmacy Consult for Heparin & warfarin Indication: H/O DVT/PE  Patient Measurements: Height: 5\' 4"  (162.6 cm) Weight: 96.6 kg (212 lb 15.4 oz) IBW/kg (Calculated) : 54.7 Heparin Dosing Weight: 76 kg  Medications:  Warfarin PTA dosing of 6.5 mg daily  Assessment: 60 y/o F [name] on warfarin PTA for a h/o DVT/PE. Pharmacy consulted to dose heparin until patient consistently tolerating PO. Patient did receive vitamin K 2 mg iv on 8/27.  Warfarin resumed 8/23, no Warfarin 8/24, received 3mg  8/25 > held. Order to resume Warfarin 8/30 > discontinued  8/31 Flex sig at 3pm, heparin stopped 11am.  Heparin and Warfarin resumed after procedure  Today, 02/06/2021 Hep level 0.13 units/ml, subtherapeutic this AM with heparin gtt @ 1000 units/hr No interruption in therapy; no line issues reported by RN CBC- Hg low at 9.9, pltc WNL INR = 1.3 Scr in process No bleeding concerns per RN  Goal of Therapy:  Heparin level 0.3-0.7 units/ml Monitor platelets by anticoagulation protocol: Yes   Plan:  Increase Heparin to 1150 units/hr  Check heparin level 8hr after heparin rate increased Warfarin 6mg  today at 1600 Daily CBC, Heparin level & INR  Leone Haven, PharmD 02/06/2021, 5:55 AM

## 2021-02-06 NOTE — Progress Notes (Signed)
[facility]                                                                              Patient Demographics  Connie Ruiz, is a 60 y.o. female, DOB - 22-Feb-1961, MCN:[id number]  Admit date - 01/26/2021   Admitting Physician Donnamarie Poag [place] DO  Outpatient Primary MD for the patient is [name], [name], MD  Outpatient specialists:   LOS - 10  days   Medical records reviewed and are as summarized below:    Chief Complaint  Patient presents with   Abdominal Pain       Brief summary   Patient is 60 year old female, [name], ESRD, now status post renal transplant in 2005, hypothyroidism, gout, GERD, hyperlipidemia, diabetes mellitus type 2, SLE, DVT on warfarin presented from [facility] with abdominal pain and diarrhea for 5 days prior to admission.  Patient reported roughly 7 episodes in a day, watery, no hematochezia or melena.  Also reported generalized weakness, fatigue, no fevers or chills. C. difficile positive.  CT abdomen pelvis showed enteritis.   Assessment & Plan    Principal Problem: Non-fulminant severe acute C. difficile colitis with ileus, abdominal pain -Presented with profuse diarrhea, generalized weakness, abdominal pain - C. difficile PCR Ag + but toxin negative.  High suspicion for C. difficile colitis, hence treating -No significant improvement with oral vancomycin. Changed to Dificid on 8/26 - Repeat CT abdomen on 8/25 did not show any bowel obstruction, perforation or toxic megacolon -GI, ID following.  -Patient was initially treated with Dificid for possible C. difficile, although with flex sig results not indicating any C. difficile, Dificid has been discontinued -Per GI, would consider mycophenolate as possible cause of her GI symptoms -Patient is followed at [facility] by nephrology.  I discussed with Dr. Leonides Schanz on-call for nephrology regarding possibly holding further mycophenolate -Recommendations from the  nephrology team including hold further mycophenolate until she can follow-up in the office in the next few weeks -Review of previous GI records indicate that she did have improvement with Imodium in the past with diarrhea.  We will give a trial of Imodium. -We will continue to advance diet to soft diet   Active Problems: History of ESRD status post renal transplant, 2005 -Continue tacrolimus, pharmacy following.  Tacrolimus level in therapeutic range. -Patient is on prednisone maintenance at 5 mg daily, currently on stress dose steroids, taper down to 12.5 mg twice daily  (continue until patient able to take p.o)  Essential hypertension, sinus tachycardia -Likely due to dehydration from fluid loss with diarrhea, C. difficile colitis -Patient is now taking p.o. meds, will DC IV Cardizem drip, placed back on oral Cardizem per outpatient dose -We will add Coreg since she had continued tachycardia   History of DVT/PE/CVA -Coumadin was held for endoscopy, INR was reversed -Coumadin has since been restarted and she is on heparin bridge   GERD -Continue IV Pepcid  Hypothyroidism -Continue home dose of Synthroid -TSH 0.5   Diabetes mellitus, type II, IDDM -Hemoglobin A1c 8.2 -CBGs controlled  Hyperlipidemia -Continue atorvastatin  Depression/anxiety -Continue sertraline  History of [name] -Declines any blood  products if needed  Obesity Estimated body mass index is 36.56 kg/m as calculated from the following:   Height as of this encounter: 5\' 4"  (1.626 m).   Weight as of this encounter: 96.6 kg.  Code Status: Full CODE STATUS DVT Prophylaxis:    Resume warfarin/heparin bridge   Level of Care: Level of care: Progressive Family Communication: Updated patient's daughter over the phone  Disposition Plan:     Status is: Inpatient  Remains inpatient appropriate because:Inpatient level of care appropriate due to severity of illness  Dispo: The patient is from:  SNF              Anticipated d/c is to: SNF              Patient currently is not medically stable to d/c.  Until consistently tolerating p.o. diet   Difficult to place patient No   Time Spent in minutes 25 minutes  Procedures:  None  Consultants:   Gastroenterology Infectious disease  Antimicrobials:   Anti-infectives (From admission, onward)    Start     Dose/Rate Route Frequency Ordered Stop   01/30/21 2200  fidaxomicin (DIFICID) tablet 200 mg  Status:  Discontinued        200 mg Oral 2 times daily 01/30/21 1832 02/05/21 0906   01/30/21 2000  vancomycin (VANCOCIN) 500 mg in sodium chloride irrigation 0.9 % 100 mL ENEMA  Status:  Discontinued        500 mg Rectal Every 6 hours 01/30/21 1838 02/02/21 1152   01/27/21 2200  cefTRIAXone (ROCEPHIN) 2 g in sodium chloride 0.9 % 100 mL IVPB  Status:  Discontinued        2 g 200 mL/hr over 30 Minutes Intravenous Every 24 hours 01/27/21 0956 01/27/21 1136   01/27/21 1200  vancomycin (VANCOCIN) capsule 125 mg  Status:  Discontinued        125 mg Oral 4 times daily 01/27/21 1135 01/30/21 1832   01/27/21 1000  metroNIDAZOLE (FLAGYL) IVPB 500 mg  Status:  Discontinued        500 mg 100 mL/hr over 60 Minutes Intravenous Every 12 hours 01/27/21 0901 01/27/21 1135   01/27/21 0115  cefTRIAXone (ROCEPHIN) 2 g in sodium chloride 0.9 % 100 mL IVPB        2 g 200 mL/hr over 30 Minutes Intravenous  Once 01/27/21 0103 01/27/21 0411   01/27/21 0115  metroNIDAZOLE (FLAGYL) IVPB 500 mg  Status:  Discontinued        500 mg 100 mL/hr over 60 Minutes Intravenous Every 8 hours 01/27/21 0103 01/27/21 0901          Medications  Scheduled Meds:  diltiazem  360 mg Oral Daily   donepezil  10 mg Oral QHS   feeding supplement (OSMOLITE 1.5 CAL)  1,000 mL Per Tube Q24H   feeding supplement (PROSource TF)  45 mL Per Tube TID   fiber  1 packet Oral BID   hydrocortisone sod succinate (SOLU-CORTEF) inj  12.5 mg Intravenous BID   insulin aspart  0-5  Units Subcutaneous QHS   insulin aspart  0-9 Units Subcutaneous TID WC   levothyroxine  150 mcg Oral Q0600   loperamide  2 mg Oral TID   metoCLOPramide (REGLAN) injection  10 mg Intravenous Q6H   mirtazapine  30 mg Oral QHS   multivitamin with minerals  1 tablet Oral Daily   tacrolimus  2.5 mg Oral Q12H   Warfarin - Pharmacist Dosing Inpatient  Does not apply q1600   Continuous Infusions:  dextrose 5 % and 0.45% NaCl 50 mL/hr at 02/06/21 1706   famotidine (PEPCID) IV 20 mg (02/06/21 0919)   heparin 1,150 Units/hr (02/06/21 1343)   promethazine (PHENERGAN) injection (IM or IVPB) 25 mg (02/05/21 1715)   PRN Meds:.acetaminophen **OR** acetaminophen, fentaNYL (SUBLIMAZE) injection, hydrALAZINE, morphine, promethazine (PHENERGAN) injection (IM or IVPB)      Subjective:   Simar Venti reports continued diffuse abdominal pain.  She is required frequent doses of pain medications.  Staff reports that overall diarrhea is better.  She is tolerating some solid food intake.   Objective:   Vitals:   02/05/21 2000 02/06/21 1344 02/06/21 1345 02/06/21 1552  BP: (!) 166/87   (!) 175/99  Pulse: 90     Resp: 12  10   Temp: 99.1 F (37.3 C) 98.9 F (37.2 C)    TempSrc:  Oral    SpO2: 96%     Weight:      Height:        Intake/Output Summary (Last 24 hours) at 02/06/2021 1847 Last data filed at 02/06/2021 1757 Gross per 24 hour  Intake 4660.01 ml  Output 1600 ml  Net 3060.01 ml     Wt Readings from Last 3 Encounters:  02/03/21 96.6 kg  08/21/20 88.9 kg  09/11/19 78.9 kg    Physical Exam General exam: Alert, awake, oriented x 3 Respiratory system: Clear to auscultation. Respiratory effort normal. Cardiovascular system:RRR. No murmurs, rubs, gallops. Gastrointestinal system: Abdomen is nondistended, soft and diffusely tender. No organomegaly or masses felt. Normal bowel sounds heard. Central nervous system: Alert and oriented. No focal neurological deficits. Extremities: No C/C/E,  +pedal pulses Skin: No rashes, lesions or ulcers Psychiatry: Judgement and insight appear normal. Mood & affect appropriate.     Data Reviewed:  I have personally reviewed following labs and imaging studies  Micro Results Recent Results (from the past 240 hour(s))  Culture, blood (routine x 2)     Status: None   Collection Time: 01/30/21  7:11 PM   Specimen: BLOOD  Result Value Ref Range Status   Specimen Description   Final    BLOOD BLOOD LEFT HAND Performed at [facility], [place] [address], [place], [place] [address]    Special Requests   Final    BOTTLES DRAWN AEROBIC ONLY Blood Culture adequate volume Performed at [place] [address] [place], [place] [address]    Culture   Final    NO GROWTH 5 DAYS Performed at [facility], [place] [address], [place], [place] [address]    Report Status 02/05/2021 FINAL  Final  Culture, blood (routine x 2)     Status: None   Collection Time: 01/30/21  7:11 PM   Specimen: BLOOD  Result Value Ref Range Status   Specimen Description   Final    BLOOD LEFT WRIST Performed at [place] [address] [place], [place] [address]    Special Requests   Final    BOTTLES DRAWN AEROBIC ONLY Blood Culture adequate volume Performed at [place] [address] [place], [place] [address]    Culture   Final    NO GROWTH 5 DAYS Performed at [facility], [place] [address] [place], [place] [address]    Report Status 02/05/2021 FINAL  Final    Radiology Reports CT ABDOMEN PELVIS WO CONTRAST  Result Date: 01/29/2021 CLINICAL DATA:  Bowel obstruction suspected. Abdominal pain, acute, nonlocalized. End-stage renal disease with previous renal transplant. EXAM: CT ABDOMEN AND PELVIS WITHOUT CONTRAST TECHNIQUE: Multidetector CT imaging of the abdomen and pelvis was performed following the standard protocol without IV contrast. COMPARISON:  01/26/2021  FINDINGS: Lower chest: Elevation of the right hemidiaphragm with mild atelectasis at the right lung base, similar to the previous study. Extensive coronary artery calcification. Hepatobiliary: Previous cholecystectomy.  No focal liver lesion. Pancreas: Normal Spleen: Normal Adrenals/Urinary Tract: Adrenal glands are normal. Markedly atrophic native kidneys without significant finding. Small cysts as seen previously. Transplant kidney in the right iliac fossa without abnormal finding by CT. Bladder appears normal. Stomach/Bowel: Stomach and small intestine are normal. Diverticulosis of the colon but no evidence of diverticulitis. No bowel obstruction or focal bowel lesion. Vascular/Lymphatic: Aortic atherosclerosis. No aneurysm. IVC is normal. No retroperitoneal adenopathy. Reproductive: No pelvic mass. Other: No free fluid or air. Musculoskeletal: No significant skeletal finding. IMPRESSION: No bowel obstruction or acute bowel pathology. Diverticulosis without evidence of diverticulitis. Elevation of the right hemidiaphragm with atelectasis at the right lung base. Atrophic native kidneys. Grossly normal appearing transplant kidney in the right iliac fossa. Aortic atherosclerosis. Widespread vascular calcification consistent with chronic renal failure. Electronically Signed   By: Nelson Chimes M.D.   On: 01/29/2021 16:10   CT ABDOMEN PELVIS WO CONTRAST  Result Date: 01/26/2021 CLINICAL DATA:  Concern for bowel obstruction. EXAM: CT ABDOMEN AND PELVIS WITHOUT CONTRAST TECHNIQUE: Multidetector CT imaging of the abdomen and pelvis was performed following the standard protocol without IV contrast. COMPARISON:  CT abdomen pelvis dated 08/21/2020. FINDINGS: Evaluation of this exam is limited in the absence of intravenous contrast. Lower chest: Minimal bibasilar atelectasis. The visualized lung bases are otherwise clear. There is advanced 3 vessel coronary vascular calcification. No intra-abdominal free air or free  fluid. Hepatobiliary: The liver is unremarkable. No intrahepatic biliary dilatation. Cholecystectomy. No retained calcified stone noted in the central CBD. Pancreas: Unremarkable. No pancreatic ductal dilatation or surrounding inflammatory changes. Spleen: Normal in size without focal abnormality. Adrenals/Urinary Tract: The adrenal glands are unremarkable. Atrophic native kidneys. Bilateral renal parenchyma nodularity similar to prior CT may represent residual renal parenchyma. A mass is less likely but not excluded. The visualized ureters appear unremarkable. The urinary bladder is collapsed. There is a right lower quadrant renal transplant. There is no hydronephrosis or nephrolithiasis of the transplant kidney. No peritransplant fluid collection. Stomach/Bowel: There is diffuse colonic diverticulosis without active inflammatory changes. There is mild thickened appearance of the jejunal folds with engorgement of the associated mesentery. Clinical correlation is recommended to evaluate for possibility of enteritis. There is no bowel obstruction. The appendix is normal. Vascular/Lymphatic: Moderate aortoiliac atherosclerotic disease. The IVC is unremarkable. No portal venous gas. There is no adenopathy. Reproductive: The uterus is grossly unremarkable. No adnexal masses. Other: Mild diffuse subcutaneous stranding.  No fluid collection. Musculoskeletal: Osteopenia with degenerative changes of the spine. Multiple old left rib fractures. No acute osseous pathology. IMPRESSION: 1. Mild thickened appearance of the jejunal folds with engorgement of the associated mesentery. Clinical correlation is recommended to evaluate for possibility of enteritis. No bowel obstruction. Normal appendix. 2. Colonic diverticulosis. 3. Atrophic native kidneys with a right lower quadrant renal transplant. No hydronephrosis or nephrolithiasis. 4. Aortic Atherosclerosis (ICD10-I70.0). Electronically Signed   By: Anner Crete M.D.   On:  01/26/2021 20:23   DG Chest 2 View  Result Date: 01/26/2021 CLINICAL DATA:  Lower abdominal pain since Friday, tachycardia EXAM: CHEST - 2  VIEW COMPARISON:  08/28/2020 FINDINGS: Frontal and lateral views of the chest demonstrate an unremarkable cardiac silhouette. No acute airspace disease, effusion, or pneumothorax. No acute bony abnormalities. IMPRESSION: 1. No acute intrathoracic process. Electronically Signed   By: Randa Ngo M.D.   On: 01/26/2021 22:17   DG Abd 2 Views  Result Date: 01/29/2021 CLINICAL DATA:  Abdominal pain EXAM: ABDOMEN - 2 VIEW COMPARISON:  None. FINDINGS: There is mildly dilated of the cecum. Nondistended small bowel. No acute osseous abnormality. There are soft tissue calcifications consistent with subcutaneous calcification seen on prior CT. IMPRESSION: Mild dilation of the cecum. If there is concern for obstruction recommend CT. Electronically Signed   By: Maurine Simmering M.D.   On: 01/29/2021 09:25   DG Abd Portable 1V  Result Date: 02/03/2021 CLINICAL DATA:  Nasogastric tube placement. EXAM: PORTABLE ABDOMEN - 1 VIEW COMPARISON:  X-ray abdomen 01/29/2021, CT abdomen pelvis 01/29/2021 FINDINGS: Enteric tube with tip overlying the expected region of the pyloric region/first portion of the duodenum. The bowel gas pattern is normal. Status post cholecystectomy. No radio-opaque calculi or other significant radiographic abnormality are seen. Aortic atherosclerosis. IMPRESSION: 1. Enteric tube with tip overlying the expected region of the pyloric region/first portion of the duodenum. 2. Aortic Atherosclerosis (ICD10-I70.0). Electronically Signed   By: Iven Finn M.D.   On: 02/03/2021 16:22   ECHOCARDIOGRAM COMPLETE  Result Date: 02/03/2021    ECHOCARDIOGRAM REPORT   Patient Name:   Connie Ruiz Date of Exam: 02/03/2021 Medical Rec #:  [id number]    Height:       64.0 in Accession #:    [id number]   Weight:       213.0 lb Date of Birth:  04-10-1961    BSA:          2.009 m  Patient Age:    89 years     BP:           163/66 mmHg Patient Gender: F            HR:           89 bpm. Exam Location:  Inpatient Procedure: 2D Echo, Cardiac Doppler and Color Doppler Indications:    R01.1 Murmur  History:        Patient has prior history of Echocardiogram examinations, most                 recent 08/14/2018. CHF, Arrythmias:Tachycardia; Risk                 Factors:Hypertension, Diabetes and Dyslipidemia.  Sonographer:    MH Referring Phys: [place]  1. Left ventricular ejection fraction, by estimation, is 65 to 70%. The left ventricle has normal function. The left ventricle has no regional wall motion abnormalities. There is mild concentric left ventricular hypertrophy. Left ventricular diastolic parameters are consistent with Grade I diastolic dysfunction (impaired relaxation).  2. Right ventricular systolic function is normal. The right ventricular size is normal.  3. The mitral valve is normal in structure. Mild mitral valve regurgitation. No evidence of mitral stenosis.  4. The aortic valve is normal in structure. Aortic valve regurgitation is not visualized. Mild aortic valve sclerosis is present, with no evidence of aortic valve stenosis.  5. The inferior vena cava is normal in size with <50% respiratory variability, suggesting right atrial pressure of 8 mmHg. FINDINGS  Left Ventricle: Left ventricular ejection fraction, by estimation, is 65 to 70%. The left ventricle has  normal function. The left ventricle has no regional wall motion abnormalities. The left ventricular internal cavity size was normal in size. There is  mild concentric left ventricular hypertrophy. Left ventricular diastolic parameters are consistent with Grade I diastolic dysfunction (impaired relaxation). Indeterminate filling pressures. Right Ventricle: The right ventricular size is normal. No increase in right ventricular wall thickness. Right ventricular systolic function is normal. Left  Atrium: Left atrial size was normal in size. Right Atrium: Right atrial size was normal in size. Pericardium: There is no evidence of pericardial effusion. Mitral Valve: The mitral valve is normal in structure. Mild to moderate mitral annular calcification. Mild mitral valve regurgitation. No evidence of mitral valve stenosis. Tricuspid Valve: The tricuspid valve is normal in structure. Tricuspid valve regurgitation is not demonstrated. No evidence of tricuspid stenosis. Aortic Valve: The aortic valve is normal in structure. Aortic valve regurgitation is not visualized. Mild aortic valve sclerosis is present, with no evidence of aortic valve stenosis. Aortic valve mean gradient measures 4.0 mmHg. Aortic valve peak gradient measures 7.4 mmHg. Aortic valve area, by VTI measures 3.13 cm. Pulmonic Valve: The pulmonic valve was normal in structure. Pulmonic valve regurgitation is not visualized. No evidence of pulmonic stenosis. Aorta: The aortic root is normal in size and structure. Venous: The inferior vena cava is normal in size with less than 50% respiratory variability, suggesting right atrial pressure of 8 mmHg. IAS/Shunts: No atrial level shunt detected by color flow Doppler.  LEFT VENTRICLE PLAX 2D LVIDd:         4.00 cm     Diastology LVIDs:         2.70 cm     LV e' medial:    4.57 cm/s LV PW:         1.30 cm     LV E/e' medial:  14.7 LV IVS:        1.70 cm     LV e' lateral:   5.00 cm/s LVOT diam:     2.20 cm     LV E/e' lateral: 13.5 LV SV:         68 LV SV Index:   34 LVOT Area:     3.80 cm  LV Volumes (MOD) LV vol d, MOD A4C: 26.9 ml LV vol s, MOD A4C: 12.0 ml LV SV MOD A4C:     26.9 ml RIGHT VENTRICLE             IVC RV S prime:     10.80 cm/s  IVC diam: 1.70 cm TAPSE (M-mode): 1.6 cm LEFT ATRIUM           Index       RIGHT ATRIUM          Index LA diam:      2.00 cm 1.00 cm/m  RA Area:     7.00 cm LA Vol (A2C): 36.3 ml 18.07 ml/m RA Volume:   12.40 ml 6.17 ml/m LA Vol (A4C): 14.0 ml 6.97 ml/m   AORTIC VALVE AV Area (Vmax):    3.21 cm AV Area (Vmean):   2.97 cm AV Area (VTI):     3.13 cm AV Vmax:           136.00 cm/s AV Vmean:          93.700 cm/s AV VTI:            0.216 m AV Peak Grad:      7.4 mmHg AV Mean Grad:  4.0 mmHg LVOT Vmax:         115.00 cm/s LVOT Vmean:        73.300 cm/s LVOT VTI:          0.178 m LVOT/AV VTI ratio: 0.82  AORTA Ao Root diam: 3.10 cm Ao Asc diam:  2.90 cm MITRAL VALVE MV Area (PHT): 3.67 cm     SHUNTS MV E velocity: 67.28 cm/s   Systemic VTI:  0.18 m MV A velocity: 158.00 cm/s  Systemic Diam: 2.20 cm MV E/A ratio:  0.43 Mihai Croitoru MD Electronically signed by Sanda Klein MD Signature Date/Time: 02/03/2021/4:54:42 PM    Final     Lab Data:  CBC: Recent Labs  Lab 02/02/21 0356 02/03/21 0505 02/04/21 0505 02/04/21 1446 02/05/21 0127 02/06/21 0500  WBC 10.0 9.1 12.5*  --  9.9 10.5  HGB 12.6 11.3* 11.8* 12.6 10.4* 9.9*  HCT 39.6 36.0 36.9 37.0 33.6* 31.7*  MCV 85.9 87.0 84.8  --  87.0 86.8  PLT 190 248 262  --  224 694   Basic Metabolic Panel: Recent Labs  Lab 01/31/21 0713 02/01/21 0618 02/03/21 1711 02/04/21 0505 02/04/21 1446 02/05/21 0127 02/06/21 0500  NA 140 140  --  142 138 137 137  K 3.6 3.6  --  2.7* 3.2* 3.2* 4.2  CL 111 112*  --  107 101 102 104  CO2 19* 23  --  30  --  26 29  GLUCOSE 205* 197*  --  170* 172* 242* 315*  BUN 7 8  --  <5* <3* 5* 15  CREATININE 0.61 0.45  --  0.44 0.40* 0.53 0.53  CALCIUM 8.6* 8.7*  --  8.9  --  8.3* 9.2  MG 2.0  --  1.3* 1.2*  --  1.6* 1.7  PHOS  --   --  1.6* 2.2*  --  2.5  --    GFR: Estimated Creatinine Clearance: 84.4 mL/min (by C-G formula based on SCr of 0.53 mg/dL). Liver Function Tests: Recent Labs  Lab 02/01/21 0618  AST 12*  ALT 10  ALKPHOS 47  BILITOT 0.4  PROT 6.1*  ALBUMIN 2.9*   No results for input(s): LIPASE, AMYLASE in the last 168 hours.  No results for input(s): AMMONIA in the last 168 hours. Coagulation Profile: Recent Labs  Lab 02/02/21 0356  02/03/21 0505 02/04/21 0505 02/05/21 0127 02/06/21 0500  INR 1.2 1.2 1.2 1.2 1.3*   Cardiac Enzymes: No results for input(s): CKTOTAL, CKMB, CKMBINDEX, TROPONINI in the last 168 hours. BNP (last 3 results) No results for input(s): PROBNP in the last 8760 hours. HbA1C: No results for input(s): HGBA1C in the last 72 hours.  CBG: Recent Labs  Lab 02/05/21 2021 02/06/21 0448 02/06/21 0731 02/06/21 1229 02/06/21 1632  GLUCAP 299* 297* 284* 198* 207*   Lipid Profile: No results for input(s): CHOL, HDL, LDLCALC, TRIG, CHOLHDL, LDLDIRECT in the last 72 hours. Thyroid Function Tests: No results for input(s): TSH, T4TOTAL, FREET4, T3FREE, THYROIDAB in the last 72 hours.  Anemia Panel: No results for input(s): VITAMINB12, FOLATE, FERRITIN, TIBC, IRON, RETICCTPCT in the last 72 hours. Urine analysis:    Component Value Date/Time   COLORURINE YELLOW 01/26/2021 2156   APPEARANCEUR CLEAR 01/26/2021 2156   LABSPEC >1.030 (H) 01/26/2021 2156   PHURINE 6.0 01/26/2021 2156   GLUCOSEU NEGATIVE 01/26/2021 2156   GLUCOSEU NEGATIVE 10/22/2013 1621   HGBUR NEGATIVE 01/26/2021 2156   BILIRUBINUR MODERATE (A) 01/26/2021 2156   KETONESUR 40 (A)  01/26/2021 2156   PROTEINUR TRACE (A) 01/26/2021 2156   UROBILINOGEN 1.0 03/27/2015 1534   NITRITE NEGATIVE 01/26/2021 2156   LEUKOCYTESUR NEGATIVE 01/26/2021 2156     Kathie Dike M.D. [facility] 02/06/2021, 6:47 PM  Available via Epic secure chat 7am-7pm After 7 pm, please refer to night coverage provider listed on amion.

## 2021-02-07 DIAGNOSIS — I5032 Chronic diastolic (congestive) heart failure: Secondary | ICD-10-CM | POA: Diagnosis not present

## 2021-02-07 DIAGNOSIS — K529 Noninfective gastroenteritis and colitis, unspecified: Secondary | ICD-10-CM | POA: Diagnosis not present

## 2021-02-07 DIAGNOSIS — A0472 Enterocolitis due to Clostridium difficile, not specified as recurrent: Secondary | ICD-10-CM | POA: Diagnosis not present

## 2021-02-07 DIAGNOSIS — I825Z9 Chronic embolism and thrombosis of unspecified deep veins of unspecified distal lower extremity: Secondary | ICD-10-CM | POA: Diagnosis not present

## 2021-02-07 LAB — RENAL FUNCTION PANEL
Albumin: 2.8 g/dL — ABNORMAL LOW (ref 3.5–5.0)
Anion gap: 4 — ABNORMAL LOW (ref 5–15)
BUN: 13 mg/dL (ref 6–20)
CO2: 32 mmol/L (ref 22–32)
Calcium: 9.4 mg/dL (ref 8.9–10.3)
Chloride: 107 mmol/L (ref 98–111)
Creatinine, Ser: 0.44 mg/dL (ref 0.44–1.00)
GFR, Estimated: >60 mL/min (ref >60)
Glucose, Bld: 167 mg/dL — ABNORMAL HIGH (ref 70–99)
Phosphorus: 3.4 mg/dL (ref 2.5–4.6)
Potassium: 3.6 mmol/L (ref 3.5–5.1)
Sodium: 143 mmol/L (ref 135–145)

## 2021-02-07 LAB — CBC
HCT: 36.3 % (ref 36.0–46.0)
Hemoglobin: 11.4 g/dL — ABNORMAL LOW (ref 12.0–15.0)
MCH: 27.1 pg (ref 26.0–34.0)
MCHC: 31.4 g/dL (ref 30.0–36.0)
MCV: 86.4 fL (ref 80.0–100.0)
Platelets: 259 10*3/uL (ref 150–400)
RBC: 4.2 MIL/uL (ref 3.87–5.11)
RDW: 16.3 % — ABNORMAL HIGH (ref 11.5–15.5)
WBC: 12.7 10*3/uL — ABNORMAL HIGH (ref 4.0–10.5)
nRBC: 0 % (ref 0.0–0.2)

## 2021-02-07 LAB — GLUCOSE, CAPILLARY
Glucose-Capillary: 149 mg/dL — ABNORMAL HIGH (ref 70–99)
Glucose-Capillary: 151 mg/dL — ABNORMAL HIGH (ref 70–99)
Glucose-Capillary: 154 mg/dL — ABNORMAL HIGH (ref 70–99)
Glucose-Capillary: 188 mg/dL — ABNORMAL HIGH (ref 70–99)
Glucose-Capillary: 223 mg/dL — ABNORMAL HIGH (ref 70–99)

## 2021-02-07 LAB — HEPARIN LEVEL (UNFRACTIONATED)
Heparin Unfractionated: 0.71 IU/mL — ABNORMAL HIGH (ref 0.30–0.70)
Heparin Unfractionated: 0.98 IU/mL — ABNORMAL HIGH (ref 0.30–0.70)
Heparin Unfractionated: 0.85 IU/mL — ABNORMAL HIGH (ref 0.30–0.70)

## 2021-02-07 LAB — PROTIME-INR
INR: 1.4 — ABNORMAL HIGH (ref 0.8–1.2)
Prothrombin Time: 17 seconds — ABNORMAL HIGH (ref 11.4–15.2)

## 2021-02-07 LAB — MAGNESIUM: Magnesium: 1.3 mg/dL — ABNORMAL LOW (ref 1.7–2.4)

## 2021-02-07 MED ORDER — HEPARIN (PORCINE) 25000 UT/250ML-% IV SOLN
1200.0000 [IU]/h | INTRAVENOUS | Status: DC
  Administered 2021-02-07: 1200 [IU]/h via INTRAVENOUS
  Filled 2021-02-07: qty 250

## 2021-02-07 MED ORDER — MAGNESIUM SULFATE 4 GM/100ML IV SOLN
4.0000 g | Freq: Once | INTRAVENOUS | Status: AC
  Administered 2021-02-07: 4 g via INTRAVENOUS
  Filled 2021-02-07: qty 100

## 2021-02-07 MED ORDER — PREDNISONE 5 MG PO TABS
10.0000 mg | ORAL_TABLET | Freq: Every day | ORAL | Status: DC
  Administered 2021-02-07 – 2021-02-08 (×2): 10 mg via ORAL
  Filled 2021-02-07 (×2): qty 2

## 2021-02-07 MED ORDER — SODIUM CHLORIDE 0.9 % IV SOLN
12.5000 mg | Freq: Four times a day (QID) | INTRAVENOUS | Status: DC | PRN
  Filled 2021-02-07 (×2): qty 0.5

## 2021-02-07 MED ORDER — ORAL CARE MOUTH RINSE
15.0000 mL | Freq: Two times a day (BID) | OROMUCOSAL | Status: DC
  Administered 2021-02-07 – 2021-02-13 (×12): 15 mL via OROMUCOSAL

## 2021-02-07 MED ORDER — HEPARIN (PORCINE) 25000 UT/250ML-% IV SOLN
950.0000 [IU]/h | INTRAVENOUS | Status: AC
  Filled 2021-02-07: qty 250

## 2021-02-07 MED ORDER — WARFARIN SODIUM 4 MG PO TABS
8.0000 mg | ORAL_TABLET | Freq: Once | ORAL | Status: AC
  Administered 2021-02-07: 8 mg via ORAL
  Filled 2021-02-07: qty 2

## 2021-02-07 MED ORDER — POTASSIUM CHLORIDE 10 MEQ/100ML IV SOLN
10.0000 meq | INTRAVENOUS | Status: AC
  Administered 2021-02-07 (×4): 10 meq via INTRAVENOUS
  Filled 2021-02-07 (×4): qty 100

## 2021-02-07 MED ORDER — LOPERAMIDE HCL 2 MG PO CAPS: 2.0000 mg | ORAL_CAPSULE | Freq: Four times a day (QID) | ORAL | Status: DC | PRN

## 2021-02-07 MED ORDER — FAMOTIDINE 20 MG PO TABS
20.0000 mg | ORAL_TABLET | Freq: Two times a day (BID) | ORAL | Status: DC
  Administered 2021-02-07 – 2021-02-13 (×13): 20 mg via ORAL
  Filled 2021-02-07 (×13): qty 1

## 2021-02-07 NOTE — Progress Notes (Signed)
Brief Pharmacy Note: IV heparin   1611 heparin level = 0.98 units/mL, supratherapeutic and increased despite decrease in rate of heparin drip earlier today. Confirmed with RN that heparin running at specified rate. Unclear if level was drawn appropriately, so redrawn.  1822 heparin level = 0.85 units/mL, still supratherapeutic. Unable to do lab draws in R arm, so heparin level was collected from L wrist (heparin drip running in L arm). Per RN, heparin drip was held for ~ 10 minutes prior to collection of specimen.   Plan: Decrease heparin infusion to 1050 units/hr Heparin level 6 hours after rate change Warfarin dose as previously ordered Daily CBC, heparin level, INR Monitor closely for s/sx of bleeding  Lindell Spar, PharmD, BCPS Clinical Pharmacist  02/07/2021 6:59 PM

## 2021-02-07 NOTE — Progress Notes (Signed)
ANTICOAGULATION CONSULT NOTE - follow up  Pharmacy Consult for Heparin & warfarin Indication: H/O DVT/PE  Patient Measurements: Height: 5\' 4"  (162.6 cm) Weight: 96.6 kg (212 lb 15.4 oz) IBW/kg (Calculated) : 54.7 Heparin Dosing Weight: 76 kg  Medications:  Warfarin PTA dosing of 6.5 mg daily  Assessment: 60 y/o F [name] on warfarin PTA for a h/o DVT/PE. Pharmacy consulted to dose heparin until patient consistently tolerating PO. Patient did receive vitamin K 2 mg iv on 8/27.  Warfarin resumed 8/23, no Warfarin 8/24, received 3mg  8/25 > held. Order to resume Warfarin 8/30 > discontinued  8/31 Flex sig at 3pm, heparin stopped 11am.  Heparin and Warfarin resumed after procedure  Today, 02/07/2021 1416 heparin level = 0.47 units/mL, now therapeutic after rate increase this AM CBC- Hgb low at 9.9, Pltc WNL INR = 1.3, subtherapeutic  SCr WNL No bleeding or infusion issues noted per nursing   PM Update: 21:17 heparin level = 0.25 (now subtherapeutic) with heparin gtt @ 1150 units/hr.  Previous heparin level therapeutic at this same rate Per RN, no interruption in therapy and no line issues No complications of therapy noted  Goal of Therapy:  Heparin level 0.3-0.7 units/ml INR 2-3 Monitor platelets by anticoagulation protocol: Yes   Plan:  Increase heparin infusion to 1250 units/hr  Check heparin level in 6 hours  Daily CBC, heparin level, PT/INR Monitor closely for s/sx of bleeding   Leone Haven, PharmD 02/07/2021, 12:20 AM

## 2021-02-07 NOTE — Progress Notes (Signed)
[facility]                                                                              Patient Demographics  Connie Ruiz, is a 60 y.o. female, DOB - 1961/03/19, WVP:[id number]  Admit date - 01/26/2021   Admitting Physician Connie Ruiz [place] DO  Outpatient Primary MD for the patient is [name], Connie G, MD  Outpatient specialists:   LOS - 11  days   Medical records reviewed and are as summarized below:    Chief Complaint  Patient presents with   Abdominal Pain       Brief summary   Patient is 60 year old female, [name], ESRD, now status post renal transplant in 2005, hypothyroidism, gout, GERD, hyperlipidemia, diabetes mellitus type 2, SLE, DVT on warfarin presented from [facility] with abdominal pain and diarrhea for 5 days prior to admission.  Patient reported roughly 7 episodes in a day, watery, no hematochezia or melena.  Also reported generalized weakness, fatigue, no fevers or chills. C. difficile positive.  CT abdomen pelvis showed enteritis.   Assessment & Plan    Principal Problem: Non-fulminant severe acute C. difficile colitis with ileus, abdominal pain -Presented with profuse diarrhea, generalized weakness, abdominal pain - C. difficile PCR Ag + but toxin negative.  High suspicion for C. difficile colitis, hence treating -No significant improvement with oral vancomycin. Changed to Dificid on 8/26 - Repeat CT abdomen on 8/25 did not show any bowel obstruction, perforation or toxic megacolon -GI, ID following.  -Patient was initially treated with Dificid for possible C. difficile, although with flex sig results not indicating any C. difficile, Dificid has been discontinued -Per GI, would consider mycophenolate as possible cause of her GI symptoms -Patient is followed at [facility] by nephrology.  I discussed with Dr. Leonides Schanz on-call for nephrology regarding possibly holding further mycophenolate -Recommendations from the  nephrology team including hold further mycophenolate until she can follow-up in the office in the next few weeks -Review of previous GI records indicate that she did have improvement with Imodium in the past with diarrhea.  She been started on Imodium with improvement -Appears to be tolerating diet   Active Problems: History of ESRD status post renal transplant, 2005 -Continue tacrolimus, pharmacy following.  Tacrolimus level in therapeutic range. -Patient is on prednisone maintenance at 5 mg daily, currently on stress dose steroids.  We will continue to taper prednisone back to maintenance dose -Mycophenolate has been put on hold due to concerns for adverse GI effects.  This will be readdressed by nephrology at [facility] on follow-up  Essential hypertension, sinus tachycardia -Likely due to dehydration from fluid loss with diarrhea, C. difficile colitis -She is on Cardizem and Coreg -Heart rate appears to be stabilizing   History of DVT/PE/CVA -Coumadin was held for endoscopy, INR was reversed -Coumadin has since been restarted and she is on heparin bridge -Currently INR 1.4   GERD -Continue Pepcid  Hypothyroidism -Continue home dose of Synthroid -TSH 0.5   Diabetes mellitus, type II, IDDM -Hemoglobin A1c 8.2 -CBGs controlled  Hyperlipidemia -Continue atorvastatin  Depression/anxiety -Continue sertraline  History of [name] -Declines  any blood products if needed  Obesity Estimated body mass index is 36.97 kg/m as calculated from the following:   Height as of this encounter: 5\' 4"  (1.626 m).   Weight as of this encounter: 97.7 kg.  Code Status: Full CODE STATUS DVT Prophylaxis:    Resume warfarin/heparin bridge   Level of Care: Level of care: Progressive Family Communication: Updated patient's daughter over the phone  Disposition Plan:     Status is: Inpatient  Remains inpatient appropriate because:Inpatient level of care appropriate due to severity  of illness  Dispo: The patient is from: SNF              Anticipated d/c is to: SNF              Patient currently is not medically stable to d/c.  Until consistently tolerating p.o. diet   Difficult to place patient No   Time Spent in minutes 25 minutes  Procedures:  None  Consultants:   Gastroenterology Infectious disease  Antimicrobials:   Anti-infectives (From admission, onward)    Start     Dose/Rate Route Frequency Ordered Stop   01/30/21 2200  fidaxomicin (DIFICID) tablet 200 mg  Status:  Discontinued        200 mg Oral 2 times daily 01/30/21 1832 02/05/21 0906   01/30/21 2000  vancomycin (VANCOCIN) 500 mg in sodium chloride irrigation 0.9 % 100 mL ENEMA  Status:  Discontinued        500 mg Rectal Every 6 hours 01/30/21 1838 02/02/21 1152   01/27/21 2200  cefTRIAXone (ROCEPHIN) 2 Ruiz in sodium chloride 0.9 % 100 mL IVPB  Status:  Discontinued        2 Ruiz 200 mL/hr over 30 Minutes Intravenous Every 24 hours 01/27/21 0956 01/27/21 1136   01/27/21 1200  vancomycin (VANCOCIN) capsule 125 mg  Status:  Discontinued        125 mg Oral 4 times daily 01/27/21 1135 01/30/21 1832   01/27/21 1000  metroNIDAZOLE (FLAGYL) IVPB 500 mg  Status:  Discontinued        500 mg 100 mL/hr over 60 Minutes Intravenous Every 12 hours 01/27/21 0901 01/27/21 1135   01/27/21 0115  cefTRIAXone (ROCEPHIN) 2 Ruiz in sodium chloride 0.9 % 100 mL IVPB        2 Ruiz 200 mL/hr over 30 Minutes Intravenous  Once 01/27/21 0103 01/27/21 0411   01/27/21 0115  metroNIDAZOLE (FLAGYL) IVPB 500 mg  Status:  Discontinued        500 mg 100 mL/hr over 60 Minutes Intravenous Every 8 hours 01/27/21 0103 01/27/21 0901          Medications  Scheduled Meds:  carvedilol  6.25 mg Oral BID WC   diltiazem  360 mg Oral Daily   donepezil  10 mg Oral QHS   famotidine  20 mg Oral BID   fiber  1 packet Oral BID   insulin aspart  0-5 Units Subcutaneous QHS   insulin aspart  0-9 Units Subcutaneous TID WC   levothyroxine  150  mcg Oral Q0600   mouth rinse  15 mL Mouth Rinse BID   mirtazapine  30 mg Oral QHS   multivitamin with minerals  1 tablet Oral Daily   predniSONE  10 mg Oral Q breakfast   tacrolimus  2.5 mg Oral Q12H   Warfarin - Pharmacist Dosing Inpatient   Does not apply q1600   Continuous Infusions:  heparin 1,200 Units/hr (02/07/21 1147)   promethazine (  PHENERGAN) injection (IM or IVPB)     PRN Meds:.acetaminophen **OR** acetaminophen, diltiazem, fentaNYL (SUBLIMAZE) injection, hydrALAZINE, loperamide, morphine, promethazine (PHENERGAN) injection (IM or IVPB)      Subjective:   Blanca Friend reports that she is feeling mildly better today.  She has not had any vomiting.  She is tolerating some p.o. intake.  She is not had any bowel movements today.   Objective:   Vitals:   02/07/21 0849 02/07/21 0850 02/07/21 1200 02/07/21 1658  BP: (!) 146/87 (!) 146/87 120/80 (!) 131/92  Pulse: (!) 120 (!) 116 (!) 107 89  Resp: 13 12 17 12   Temp: 98.7 F (37.1 C) 98.7 F (37.1 C) 97.9 F (36.6 C) 97.7 F (36.5 C)  TempSrc: Oral Oral Oral Oral  SpO2: 97% 97% 96% 97%  Weight:      Height:        Intake/Output Summary (Last 24 hours) at 02/07/2021 1831 Last data filed at 02/07/2021 1400 Gross per 24 hour  Intake 878.93 ml  Output 1300 ml  Net -421.07 ml     Wt Readings from Last 3 Encounters:  02/07/21 97.7 kg  08/21/20 88.9 kg  09/11/19 78.9 kg    Physical Exam General exam: Alert, awake, oriented x 3 Respiratory system: Clear to auscultation. Respiratory effort normal. Cardiovascular system:RRR. No murmurs, rubs, gallops. Gastrointestinal system: Abdomen is nondistended, soft and diffusely tender to palpation. No organomegaly or masses felt. Normal bowel sounds heard. Central nervous system: Alert and oriented. No focal neurological deficits. Extremities: No C/C/E, +pedal pulses Skin: No rashes, lesions or ulcers Psychiatry: Judgement and insight appear normal. Mood & affect appropriate.    Data Reviewed:  I have personally reviewed following labs and imaging studies  Micro Results Recent Results (from the past 240 hour(s))  Culture, blood (routine x 2)     Status: None   Collection Time: 01/30/21  7:11 PM   Specimen: BLOOD  Result Value Ref Range Status   Specimen Description   Final    BLOOD BLOOD LEFT HAND Performed at [facility], [place] [address], [place], [place] [address]    Special Requests   Final    BOTTLES DRAWN AEROBIC ONLY Blood Culture adequate volume Performed at [place] [address] [place], [place] [address]    Culture   Final    NO GROWTH 5 DAYS Performed at [facility], [place] [address], [place], [place] [address]    Report Status 02/05/2021 FINAL  Final  Culture, blood (routine x 2)     Status: None   Collection Time: 01/30/21  7:11 PM   Specimen: BLOOD  Result Value Ref Range Status   Specimen Description   Final    BLOOD LEFT WRIST Performed at [place] [address], [place], [place] [address]    Special Requests   Final    BOTTLES DRAWN AEROBIC ONLY Blood Culture adequate volume Performed at [place] [address], [place], [place] [address]    Culture   Final    NO GROWTH 5 DAYS Performed at [facility], [place] [address] [place],  [address]    Report Status 02/05/2021 FINAL  Final    Radiology Reports CT ABDOMEN PELVIS WO CONTRAST  Result Date: 01/29/2021 CLINICAL DATA:  Bowel obstruction suspected. Abdominal pain, acute, nonlocalized. End-stage renal disease with previous renal transplant. EXAM: CT ABDOMEN AND PELVIS WITHOUT CONTRAST TECHNIQUE: Multidetector CT imaging of the abdomen  and pelvis was performed following the standard protocol without IV contrast. COMPARISON:  01/26/2021 FINDINGS: Lower chest: Elevation of the right hemidiaphragm with mild atelectasis at the right lung base, similar to the previous study.  Extensive coronary artery calcification. Hepatobiliary: Previous cholecystectomy.  No focal liver lesion. Pancreas: Normal Spleen: Normal Adrenals/Urinary Tract: Adrenal glands are normal. Markedly atrophic native kidneys without significant finding. Small cysts as seen previously. Transplant kidney in the right iliac fossa without abnormal finding by CT. Bladder appears normal. Stomach/Bowel: Stomach and small intestine are normal. Diverticulosis of the colon but no evidence of diverticulitis. No bowel obstruction or focal bowel lesion. Vascular/Lymphatic: Aortic atherosclerosis. No aneurysm. IVC is normal. No retroperitoneal adenopathy. Reproductive: No pelvic mass. Other: No free fluid or air. Musculoskeletal: No significant skeletal finding. IMPRESSION: No bowel obstruction or acute bowel pathology. Diverticulosis without evidence of diverticulitis. Elevation of the right hemidiaphragm with atelectasis at the right lung base. Atrophic native kidneys. Grossly normal appearing transplant kidney in the right iliac fossa. Aortic atherosclerosis. Widespread vascular calcification consistent with chronic renal failure. Electronically Signed   By: Nelson Chimes M.D.   On: 01/29/2021 16:10   CT ABDOMEN PELVIS WO CONTRAST  Result Date: 01/26/2021 CLINICAL DATA:  Concern for bowel obstruction. EXAM: CT ABDOMEN AND PELVIS WITHOUT CONTRAST TECHNIQUE: Multidetector CT imaging of the abdomen and pelvis was performed following the standard protocol without IV contrast. COMPARISON:  CT abdomen pelvis dated 08/21/2020. FINDINGS: Evaluation of this exam is limited in the absence of intravenous contrast. Lower chest: Minimal bibasilar atelectasis. The visualized lung bases are otherwise clear. There is advanced 3 vessel coronary vascular calcification. No intra-abdominal free air or free fluid. Hepatobiliary: The liver is unremarkable. No intrahepatic biliary dilatation. Cholecystectomy. No retained calcified stone noted in the  central CBD. Pancreas: Unremarkable. No pancreatic ductal dilatation or surrounding inflammatory changes. Spleen: Normal in size without focal abnormality. Adrenals/Urinary Tract: The adrenal glands are unremarkable. Atrophic native kidneys. Bilateral renal parenchyma nodularity similar to prior CT may represent residual renal parenchyma. A mass is less likely but not excluded. The visualized ureters appear unremarkable. The urinary bladder is collapsed. There is a right lower quadrant renal transplant. There is no hydronephrosis or nephrolithiasis of the transplant kidney. No peritransplant fluid collection. Stomach/Bowel: There is diffuse colonic diverticulosis without active inflammatory changes. There is mild thickened appearance of the jejunal folds with engorgement of the associated mesentery. Clinical correlation is recommended to evaluate for possibility of enteritis. There is no bowel obstruction. The appendix is normal. Vascular/Lymphatic: Moderate aortoiliac atherosclerotic disease. The IVC is unremarkable. No portal venous gas. There is no adenopathy. Reproductive: The uterus is grossly unremarkable. No adnexal masses. Other: Mild diffuse subcutaneous stranding.  No fluid collection. Musculoskeletal: Osteopenia with degenerative changes of the spine. Multiple old left rib fractures. No acute osseous pathology. IMPRESSION: 1. Mild thickened appearance of the jejunal folds with engorgement of the associated mesentery. Clinical correlation is recommended to evaluate for possibility of enteritis. No bowel obstruction. Normal appendix. 2. Colonic diverticulosis. 3. Atrophic native kidneys with a right lower quadrant renal transplant. No hydronephrosis or nephrolithiasis. 4. Aortic Atherosclerosis (ICD10-I70.0). Electronically Signed   By: Anner Crete M.D.   On: 01/26/2021 20:23   DG Chest 2 View  Result Date: 01/26/2021 CLINICAL DATA:  Lower abdominal pain since Friday, tachycardia EXAM: CHEST - 2  VIEW COMPARISON:  08/28/2020 FINDINGS: Frontal and lateral views of the chest demonstrate an unremarkable cardiac silhouette. No acute airspace disease, effusion, or pneumothorax. No acute bony abnormalities.  IMPRESSION: 1. No acute intrathoracic process. Electronically Signed   By: Randa Ngo M.D.   On: 01/26/2021 22:17   DG Abd 2 Views  Result Date: 01/29/2021 CLINICAL DATA:  Abdominal pain EXAM: ABDOMEN - 2 VIEW COMPARISON:  None. FINDINGS: There is mildly dilated of the cecum. Nondistended small bowel. No acute osseous abnormality. There are soft tissue calcifications consistent with subcutaneous calcification seen on prior CT. IMPRESSION: Mild dilation of the cecum. If there is concern for obstruction recommend CT. Electronically Signed   By: Maurine Simmering M.D.   On: 01/29/2021 09:25   DG Abd Portable 1V  Result Date: 02/03/2021 CLINICAL DATA:  Nasogastric tube placement. EXAM: PORTABLE ABDOMEN - 1 VIEW COMPARISON:  X-ray abdomen 01/29/2021, CT abdomen pelvis 01/29/2021 FINDINGS: Enteric tube with tip overlying the expected region of the pyloric region/first portion of the duodenum. The bowel gas pattern is normal. Status post cholecystectomy. No radio-opaque calculi or other significant radiographic abnormality are seen. Aortic atherosclerosis. IMPRESSION: 1. Enteric tube with tip overlying the expected region of the pyloric region/first portion of the duodenum. 2. Aortic Atherosclerosis (ICD10-I70.0). Electronically Signed   By: Iven Finn M.D.   On: 02/03/2021 16:22   ECHOCARDIOGRAM COMPLETE  Result Date: 02/03/2021    ECHOCARDIOGRAM REPORT   Patient Name:   Connie Ruiz Date of Exam: 02/03/2021 Medical Rec #:  [id number]    Height:       64.0 in Accession #:    [id number]   Weight:       213.0 lb Date of Birth:  1961/02/05    BSA:          2.009 m Patient Age:    41 years     BP:           163/66 mmHg Patient Gender: F            HR:           89 bpm. Exam Location:  Inpatient Procedure: 2D  Echo, Cardiac Doppler and Color Doppler Indications:    R01.1 Murmur  History:        Patient has prior history of Echocardiogram examinations, most                 recent 08/14/2018. CHF, Arrythmias:Tachycardia; Risk                 Factors:Hypertension, Diabetes and Dyslipidemia.  Sonographer:    MH Referring Phys: [place]  1. Left ventricular ejection fraction, by estimation, is 65 to 70%. The left ventricle has normal function. The left ventricle has no regional wall motion abnormalities. There is mild concentric left ventricular hypertrophy. Left ventricular diastolic parameters are consistent with Grade I diastolic dysfunction (impaired relaxation).  2. Right ventricular systolic function is normal. The right ventricular size is normal.  3. The mitral valve is normal in structure. Mild mitral valve regurgitation. No evidence of mitral stenosis.  4. The aortic valve is normal in structure. Aortic valve regurgitation is not visualized. Mild aortic valve sclerosis is present, with no evidence of aortic valve stenosis.  5. The inferior vena cava is normal in size with <50% respiratory variability, suggesting right atrial pressure of 8 mmHg. FINDINGS  Left Ventricle: Left ventricular ejection fraction, by estimation, is 65 to 70%. The left ventricle has normal function. The left ventricle has no regional wall motion abnormalities. The left ventricular internal cavity size was normal in size. There is  mild concentric left ventricular  hypertrophy. Left ventricular diastolic parameters are consistent with Grade I diastolic dysfunction (impaired relaxation). Indeterminate filling pressures. Right Ventricle: The right ventricular size is normal. No increase in right ventricular wall thickness. Right ventricular systolic function is normal. Left Atrium: Left atrial size was normal in size. Right Atrium: Right atrial size was normal in size. Pericardium: There is no evidence of pericardial  effusion. Mitral Valve: The mitral valve is normal in structure. Mild to moderate mitral annular calcification. Mild mitral valve regurgitation. No evidence of mitral valve stenosis. Tricuspid Valve: The tricuspid valve is normal in structure. Tricuspid valve regurgitation is not demonstrated. No evidence of tricuspid stenosis. Aortic Valve: The aortic valve is normal in structure. Aortic valve regurgitation is not visualized. Mild aortic valve sclerosis is present, with no evidence of aortic valve stenosis. Aortic valve mean gradient measures 4.0 mmHg. Aortic valve peak gradient measures 7.4 mmHg. Aortic valve area, by VTI measures 3.13 cm. Pulmonic Valve: The pulmonic valve was normal in structure. Pulmonic valve regurgitation is not visualized. No evidence of pulmonic stenosis. Aorta: The aortic root is normal in size and structure. Venous: The inferior vena cava is normal in size with less than 50% respiratory variability, suggesting right atrial pressure of 8 mmHg. IAS/Shunts: No atrial level shunt detected by color flow Doppler.  LEFT VENTRICLE PLAX 2D LVIDd:         4.00 cm     Diastology LVIDs:         2.70 cm     LV e' medial:    4.57 cm/s LV PW:         1.30 cm     LV E/e' medial:  14.7 LV IVS:        1.70 cm     LV e' lateral:   5.00 cm/s LVOT diam:     2.20 cm     LV E/e' lateral: 13.5 LV SV:         68 LV SV Index:   34 LVOT Area:     3.80 cm  LV Volumes (MOD) LV vol d, MOD A4C: 26.9 ml LV vol s, MOD A4C: 12.0 ml LV SV MOD A4C:     26.9 ml RIGHT VENTRICLE             IVC RV S prime:     10.80 cm/s  IVC diam: 1.70 cm TAPSE (M-mode): 1.6 cm LEFT ATRIUM           Index       RIGHT ATRIUM          Index LA diam:      2.00 cm 1.00 cm/m  RA Area:     7.00 cm LA Vol (A2C): 36.3 ml 18.07 ml/m RA Volume:   12.40 ml 6.17 ml/m LA Vol (A4C): 14.0 ml 6.97 ml/m  AORTIC VALVE AV Area (Vmax):    3.21 cm AV Area (Vmean):   2.97 cm AV Area (VTI):     3.13 cm AV Vmax:           136.00 cm/s AV Vmean:           93.700 cm/s AV VTI:            0.216 m AV Peak Grad:      7.4 mmHg AV Mean Grad:      4.0 mmHg LVOT Vmax:         115.00 cm/s LVOT Vmean:        73.300 cm/s LVOT VTI:  0.178 m LVOT/AV VTI ratio: 0.82  AORTA Ao Root diam: 3.10 cm Ao Asc diam:  2.90 cm MITRAL VALVE MV Area (PHT): 3.67 cm     SHUNTS MV E velocity: 67.28 cm/s   Systemic VTI:  0.18 m MV A velocity: 158.00 cm/s  Systemic Diam: 2.20 cm MV E/A ratio:  0.43 Mihai Croitoru MD Electronically signed by Sanda Klein MD Signature Date/Time: 02/03/2021/4:54:42 PM    Final     Lab Data:  CBC: Recent Labs  Lab 02/03/21 0505 02/04/21 0505 02/04/21 1446 02/05/21 0127 02/06/21 0500 02/07/21 0813  WBC 9.1 12.5*  --  9.9 10.5 12.7*  HGB 11.3* 11.8* 12.6 10.4* 9.9* 11.4*  HCT 36.0 36.9 37.0 33.6* 31.7* 36.3  MCV 87.0 84.8  --  87.0 86.8 86.4  PLT 248 262  --  224 216 094   Basic Metabolic Panel: Recent Labs  Lab 02/01/21 0618 02/03/21 1711 02/04/21 0505 02/04/21 1446 02/05/21 0127 02/06/21 0500 02/07/21 0813  NA 140  --  142 138 137 137 143  K 3.6  --  2.7* 3.2* 3.2* 4.2 3.6  CL 112*  --  107 101 102 104 107  CO2 23  --  30  --  26 29 32  GLUCOSE 197*  --  170* 172* 242* 315* 167*  BUN 8  --  <5* <3* 5* 15 13  CREATININE 0.45  --  0.44 0.40* 0.53 0.53 0.44  CALCIUM 8.7*  --  8.9  --  8.3* 9.2 9.4  MG  --  1.3* 1.2*  --  1.6* 1.7 1.3*  PHOS  --  1.6* 2.2*  --  2.5  --  3.4   GFR: Estimated Creatinine Clearance: 84.9 mL/min (by C-Ruiz formula based on SCr of 0.44 mg/dL). Liver Function Tests: Recent Labs  Lab 02/01/21 0618 02/07/21 0813  AST 12*  --   ALT 10  --   ALKPHOS 47  --   BILITOT 0.4  --   PROT 6.1*  --   ALBUMIN 2.9* 2.8*   No results for input(s): LIPASE, AMYLASE in the last 168 hours.  No results for input(s): AMMONIA in the last 168 hours. Coagulation Profile: Recent Labs  Lab 02/03/21 0505 02/04/21 0505 02/05/21 0127 02/06/21 0500 02/07/21 0813  INR 1.2 1.2 1.2 1.3* 1.4*   Cardiac  Enzymes: No results for input(s): CKTOTAL, CKMB, CKMBINDEX, TROPONINI in the last 168 hours. BNP (last 3 results) No results for input(s): PROBNP in the last 8760 hours. HbA1C: No results for input(s): HGBA1C in the last 72 hours.  CBG: Recent Labs  Lab 02/06/21 2051 02/07/21 0020 02/07/21 0812 02/07/21 1135 02/07/21 1720  GLUCAP 125* 151* 149* 154* 223*   Lipid Profile: No results for input(s): CHOL, HDL, LDLCALC, TRIG, CHOLHDL, LDLDIRECT in the last 72 hours. Thyroid Function Tests: No results for input(s): TSH, T4TOTAL, FREET4, T3FREE, THYROIDAB in the last 72 hours.  Anemia Panel: No results for input(s): VITAMINB12, FOLATE, FERRITIN, TIBC, IRON, RETICCTPCT in the last 72 hours. Urine analysis:    Component Value Date/Time   COLORURINE YELLOW 01/26/2021 2156   APPEARANCEUR CLEAR 01/26/2021 2156   LABSPEC >1.030 (H) 01/26/2021 2156   PHURINE 6.0 01/26/2021 2156   GLUCOSEU NEGATIVE 01/26/2021 2156   GLUCOSEU NEGATIVE 10/22/2013 1621   HGBUR NEGATIVE 01/26/2021 2156   BILIRUBINUR MODERATE (A) 01/26/2021 2156   KETONESUR 40 (A) 01/26/2021 2156   PROTEINUR TRACE (A) 01/26/2021 2156   UROBILINOGEN 1.0 03/27/2015 1534   NITRITE NEGATIVE 01/26/2021  2156   LEUKOCYTESUR NEGATIVE 01/26/2021 2156     Kathie Dike M.D. [facility] 02/07/2021, 6:31 PM  Available via Epic secure chat 7am-7pm After 7 pm, please refer to night coverage provider listed on amion.

## 2021-02-07 NOTE — Progress Notes (Signed)
ANTICOAGULATION CONSULT NOTE - follow up  Pharmacy Consult for Heparin & warfarin Indication: H/O DVT/PE  Patient Measurements: Height: 5\' 4"  (162.6 cm) Weight: 97.7 kg (215 lb 6.2 oz) IBW/kg (Calculated) : 54.7 Heparin Dosing Weight: 76 kg  Medications:  Warfarin PTA dosing of 6.5 mg daily  Assessment: 60 y/o F [name] on warfarin PTA for a h/o DVT/PE. Pharmacy consulted to dose heparin until patient consistently tolerating PO. Patient did receive vitamin K 2 mg IV on 8/27.  Warfarin resumed 8/23, no Warfarin 8/24, received 3mg  8/25 > held. Order to resume Warfarin 8/30 > discontinued  8/31 Flex sig at 3pm, heparin stopped 11am.  Heparin and Warfarin resumed after procedure  Today, 02/07/2021 AM heparin level = 0.71 units/mL, now slightly supratherapeutic after rate increase overnight CBC: Hgb low, but improved to 11.4, Pltc WNL INR = 1.4, subtherapeutic, but trending up slowly   SCr WNL No bleeding or infusion issues noted per nursing   Goal of Therapy:  Heparin level 0.3-0.7 units/ml INR 2-3 Monitor platelets by anticoagulation protocol: Yes   Plan:  Decrease heparin infusion to 1200 units/hr  Check heparin level 6 hours after rate change  Warfarin 8mg  PO x 1 today  Daily CBC, heparin level, PT/INR Monitor closely for s/sx of bleeding   Lindell Spar, PharmD, BCPS Clinical Pharmacist  02/07/2021, 9:45 AM

## 2021-02-08 DIAGNOSIS — I5032 Chronic diastolic (congestive) heart failure: Secondary | ICD-10-CM | POA: Diagnosis not present

## 2021-02-08 DIAGNOSIS — I825Z9 Chronic embolism and thrombosis of unspecified deep veins of unspecified distal lower extremity: Secondary | ICD-10-CM | POA: Diagnosis not present

## 2021-02-08 DIAGNOSIS — A0472 Enterocolitis due to Clostridium difficile, not specified as recurrent: Secondary | ICD-10-CM | POA: Diagnosis not present

## 2021-02-08 DIAGNOSIS — K529 Noninfective gastroenteritis and colitis, unspecified: Secondary | ICD-10-CM | POA: Diagnosis not present

## 2021-02-08 LAB — RENAL FUNCTION PANEL
Albumin: 2.9 g/dL — ABNORMAL LOW (ref 3.5–5.0)
Anion gap: 6 (ref 5–15)
BUN: 18 mg/dL (ref 6–20)
CO2: 29 mmol/L (ref 22–32)
Calcium: 9.1 mg/dL (ref 8.9–10.3)
Chloride: 103 mmol/L (ref 98–111)
Creatinine, Ser: 0.62 mg/dL (ref 0.44–1.00)
GFR, Estimated: >60 mL/min (ref >60)
Glucose, Bld: 156 mg/dL — ABNORMAL HIGH (ref 70–99)
Phosphorus: 3.8 mg/dL (ref 2.5–4.6)
Potassium: 4.9 mmol/L (ref 3.5–5.1)
Sodium: 138 mmol/L (ref 135–145)

## 2021-02-08 LAB — GLUCOSE, CAPILLARY
Glucose-Capillary: 145 mg/dL — ABNORMAL HIGH (ref 70–99)
Glucose-Capillary: 147 mg/dL — ABNORMAL HIGH (ref 70–99)
Glucose-Capillary: 183 mg/dL — ABNORMAL HIGH (ref 70–99)
Glucose-Capillary: 176 mg/dL — ABNORMAL HIGH (ref 70–99)

## 2021-02-08 LAB — HEPARIN LEVEL (UNFRACTIONATED): Heparin Unfractionated: 0.78 IU/mL — ABNORMAL HIGH (ref 0.30–0.70)

## 2021-02-08 LAB — CBC
HCT: 35.9 % — ABNORMAL LOW (ref 36.0–46.0)
Hemoglobin: 11 g/dL — ABNORMAL LOW (ref 12.0–15.0)
MCH: 26.9 pg (ref 26.0–34.0)
MCHC: 30.6 g/dL (ref 30.0–36.0)
MCV: 87.8 fL (ref 80.0–100.0)
Platelets: 253 10*3/uL (ref 150–400)
RBC: 4.09 MIL/uL (ref 3.87–5.11)
RDW: 16.5 % — ABNORMAL HIGH (ref 11.5–15.5)
WBC: 14.4 10*3/uL — ABNORMAL HIGH (ref 4.0–10.5)
nRBC: 0 % (ref 0.0–0.2)

## 2021-02-08 LAB — MAGNESIUM: Magnesium: 2.3 mg/dL (ref 1.7–2.4)

## 2021-02-08 LAB — PROTIME-INR
INR: 1.6 — ABNORMAL HIGH (ref 0.8–1.2)
Prothrombin Time: 18.7 seconds — ABNORMAL HIGH (ref 11.4–15.2)

## 2021-02-08 MED ORDER — PREDNISONE 5 MG PO TABS
5.0000 mg | ORAL_TABLET | Freq: Every day | ORAL | Status: DC
  Administered 2021-02-09 – 2021-02-13 (×5): 5 mg via ORAL
  Filled 2021-02-08 (×5): qty 1

## 2021-02-08 MED ORDER — WARFARIN SODIUM 4 MG PO TABS
8.0000 mg | ORAL_TABLET | Freq: Once | ORAL | Status: AC
  Administered 2021-02-08: 8 mg via ORAL
  Filled 2021-02-08: qty 2

## 2021-02-08 MED ORDER — ENOXAPARIN SODIUM 100 MG/ML IJ SOSY
100.0000 mg | PREFILLED_SYRINGE | Freq: Two times a day (BID) | INTRAMUSCULAR | Status: DC
  Administered 2021-02-08 – 2021-02-09 (×3): 100 mg via SUBCUTANEOUS
  Filled 2021-02-08 (×3): qty 1

## 2021-02-08 MED ORDER — FUROSEMIDE 20 MG PO TABS
20.0000 mg | ORAL_TABLET | Freq: Every day | ORAL | Status: DC
  Administered 2021-02-08 – 2021-02-13 (×6): 20 mg via ORAL
  Filled 2021-02-08 (×6): qty 1

## 2021-02-08 NOTE — Progress Notes (Signed)
ANTICOAGULATION CONSULT NOTE - follow up  Pharmacy Consult for Heparin & warfarin Indication: H/O DVT/PE  Patient Measurements: Height: 5\' 4"  (162.6 cm) Weight: 97.7 kg (215 lb 6.2 oz) IBW/kg (Calculated) : 54.7 Heparin Dosing Weight: 76 kg  Medications:  Warfarin PTA dosing of 6.5 mg daily  Assessment: 60 y/o F [name] on warfarin PTA for a h/o DVT/PE. Pharmacy consulted to dose heparin until patient consistently tolerating PO. Patient did receive vitamin K 2 mg IV on 8/27.  Warfarin resumed 8/23, no Warfarin 8/24, received 3mg  8/25 > held. Order to resume Warfarin 8/30 > discontinued  8/31 Flex sig at 3pm, heparin stopped 11am.  Heparin and Warfarin resumed after procedure  Today, 02/08/2021 AM heparin level = 0.78 units/mL, supratherapeutic with heparin gtt @ 1050 units/hr CBC: Hgb 11 low, Pltc WNL INR = 1.6, subtherapeutic, but trending up slowly   SCr still pending No bleeding or infusion issues noted per nursing   Goal of Therapy:  Heparin level 0.3-0.7 units/ml INR 2-3 Monitor platelets by anticoagulation protocol: Yes   Plan:  Decrease heparin infusion to 950 units/hr  Check heparin level 6 hours after rate change  Warfarin 8mg  PO x 1 today  Daily CBC, heparin level, PT/INR Monitor closely for s/sx of bleeding   Leone Haven, PharmD 02/08/2021, 4:11 AM

## 2021-02-08 NOTE — Progress Notes (Signed)
[facility]                                                                              Patient Demographics  Connie Ruiz, is a 60 y.o. female, DOB - 13-Aug-1960, VEH:[id number]  Admit date - 01/26/2021   Admitting Physician Donnamarie Poag [place] DO  Outpatient Primary MD for the patient is [name], [name], MD  Outpatient specialists:   LOS - 12  days   Medical records reviewed and are as summarized below:    Chief Complaint  Patient presents with   Abdominal Pain       Brief summary   Patient is 60 year old female, [name], ESRD, now status post renal transplant in 2005, hypothyroidism, gout, GERD, hyperlipidemia, diabetes mellitus type 2, SLE, DVT on warfarin presented from [facility] with abdominal pain and diarrhea for 5 days prior to admission.  Patient reported roughly 7 episodes in a day, watery, no hematochezia or melena.  Also reported generalized weakness, fatigue, no fevers or chills. C. difficile positive.  CT abdomen pelvis showed enteritis.   Assessment & Plan    Principal Problem: Enteritis/diarrhea, abdominal pain -Presented with profuse diarrhea, generalized weakness, abdominal pain - C. difficile PCR Ag + but toxin negative.  Initially high suspicion for C. difficile colitis, so she was started on treatment. -No significant improvement with oral vancomycin. Changed to Dificid on 8/26 - Repeat CT abdomen on 8/25 did not show any bowel obstruction, perforation or toxic megacolon -GI, ID following.  -Patient was initially treated with Dificid for possible C. difficile, although with flex sig results not indicating any pseudomembranes, Dificid was discontinued -Per GI, would consider mycophenolate as possible cause of her GI symptoms -Patient is followed at [facility] by nephrology.  I discussed with Dr. Leonides Ruiz on-call for nephrology regarding possibly holding further mycophenolate -Recommendations from the nephrology team  including hold further mycophenolate until she can follow-up in the office in the next few weeks -Review of previous GI records indicate that she did have improvement with Imodium in the past with diarrhea.  She been started on Imodium with improvement -Appears to be tolerating diet   Active Problems: History of ESRD status post renal transplant, 2005 -Continue tacrolimus, pharmacy following.  Tacrolimus level in therapeutic range. -Patient is on prednisone maintenance at 5 mg daily, currently on stress dose steroids.  We will continue to taper prednisone back to maintenance dose -Mycophenolate has been put on hold due to concerns for adverse GI effects.  This will be readdressed by nephrology at [facility] on follow-up  Essential hypertension, sinus tachycardia -Likely due to dehydration from fluid loss with diarrhea -She is on Cardizem and Coreg -Heart rate appears to be stabilizing   History of DVT/PE/CVA -Coumadin was held for endoscopy, INR was reversed -Coumadin has since been restarted and she is on Lovenox bridge -Currently INR 1.6   GERD -Continue Pepcid  Hypothyroidism -Continue home dose of Synthroid -TSH 0.5   Diabetes mellitus, type II, IDDM -Hemoglobin A1c 8.2 -CBGs controlled  Hyperlipidemia -Continue atorvastatin  Depression/anxiety -Continue sertraline  History of [name] -Declines any blood products if needed  Obesity  Estimated body mass index is 37.01 kg/m as calculated from the following:   Height as of this encounter: 5\' 4"  (1.626 m).   Weight as of this encounter: 97.8 kg.  Code Status: Full CODE STATUS DVT Prophylaxis:    Resume warfarin/heparin bridge   Level of Care: Level of care: Progressive Family Communication: Updated patient's daughter over the phone  Disposition Plan:     Status is: Inpatient  Remains inpatient appropriate because:Inpatient level of care appropriate due to severity of illness  Dispo: The patient is  from: SNF              Anticipated d/c is to: SNF              Patient currently is not medically stable to d/c.     Difficult to place patient No   Time Spent in minutes 25 minutes  Procedures:  None  Consultants:   Gastroenterology Infectious disease  Antimicrobials:   Anti-infectives (From admission, onward)    Start     Dose/Rate Route Frequency Ordered Stop   01/30/21 2200  fidaxomicin (DIFICID) tablet 200 mg  Status:  Discontinued        200 mg Oral 2 times daily 01/30/21 1832 02/05/21 0906   01/30/21 2000  vancomycin (VANCOCIN) 500 mg in sodium chloride irrigation 0.9 % 100 mL ENEMA  Status:  Discontinued        500 mg Rectal Every 6 hours 01/30/21 1838 02/02/21 1152   01/27/21 2200  cefTRIAXone (ROCEPHIN) 2 Ruiz in sodium chloride 0.9 % 100 mL IVPB  Status:  Discontinued        2 Ruiz 200 mL/hr over 30 Minutes Intravenous Every 24 hours 01/27/21 0956 01/27/21 1136   01/27/21 1200  vancomycin (VANCOCIN) capsule 125 mg  Status:  Discontinued        125 mg Oral 4 times daily 01/27/21 1135 01/30/21 1832   01/27/21 1000  metroNIDAZOLE (FLAGYL) IVPB 500 mg  Status:  Discontinued        500 mg 100 mL/hr over 60 Minutes Intravenous Every 12 hours 01/27/21 0901 01/27/21 1135   01/27/21 0115  cefTRIAXone (ROCEPHIN) 2 Ruiz in sodium chloride 0.9 % 100 mL IVPB        2 Ruiz 200 mL/hr over 30 Minutes Intravenous  Once 01/27/21 0103 01/27/21 0411   01/27/21 0115  metroNIDAZOLE (FLAGYL) IVPB 500 mg  Status:  Discontinued        500 mg 100 mL/hr over 60 Minutes Intravenous Every 8 hours 01/27/21 0103 01/27/21 0901          Medications  Scheduled Meds:  carvedilol  6.25 mg Oral BID WC   diltiazem  360 mg Oral Daily   donepezil  10 mg Oral QHS   enoxaparin (LOVENOX) injection  100 mg Subcutaneous Q12H   famotidine  20 mg Oral BID   fiber  1 packet Oral BID   furosemide  20 mg Oral Daily   insulin aspart  0-5 Units Subcutaneous QHS   insulin aspart  0-9 Units Subcutaneous TID WC    levothyroxine  150 mcg Oral Q0600   mouth rinse  15 mL Mouth Rinse BID   mirtazapine  30 mg Oral QHS   multivitamin with minerals  1 tablet Oral Daily   [START ON 02/09/2021] predniSONE  5 mg Oral Q breakfast   tacrolimus  2.5 mg Oral Q12H   Warfarin - Pharmacist Dosing Inpatient   Does not apply q1600   Continuous  Infusions:  promethazine (PHENERGAN) injection (IM or IVPB)     PRN Meds:.acetaminophen **OR** acetaminophen, diltiazem, fentaNYL (SUBLIMAZE) injection, hydrALAZINE, loperamide, morphine, promethazine (PHENERGAN) injection (IM or IVPB)      Subjective:   Connie Ruiz  has not had any vomiting.  She does not care for the food in the hospital.  Overall diarrhea has improved.   Objective:   Vitals:   02/07/21 1658 02/07/21 2025 02/08/21 0425 02/08/21 1349  BP: (!) 131/92 (!) 131/98 (!) 149/91   Pulse: 89  93   Resp: 12 13 12    Temp: 97.7 F (36.5 C) 98.2 F (36.8 C) 98.2 F (36.8 C) 98.2 F (36.8 C)  TempSrc: Oral Oral Oral Oral  SpO2: 97% 95% 95%   Weight:   97.8 kg   Height:        Intake/Output Summary (Last 24 hours) at 02/08/2021 1923 Last data filed at 02/08/2021 1300 Gross per 24 hour  Intake 537.66 ml  Output 400 ml  Net 137.66 ml     Wt Readings from Last 3 Encounters:  02/08/21 97.8 kg  08/21/20 88.9 kg  09/11/19 78.9 kg    Physical Exam General exam: Alert, awake, oriented x 3 Respiratory system: Clear to auscultation. Respiratory effort normal. Cardiovascular system:RRR. No murmurs, rubs, gallops. Gastrointestinal system: Abdomen is nondistended, soft and diffusely tender. No organomegaly or masses felt. Normal bowel sounds heard. Central nervous system: Alert and oriented. No focal neurological deficits. Extremities: 1+ edema bilaterally Skin: No rashes, lesions or ulcers Psychiatry: Judgement and insight appear normal. Mood & affect appropriate.    Data Reviewed:  I have personally reviewed following labs and imaging studies  Micro  Results Recent Results (from the past 240 hour(s))  Culture, blood (routine x 2)     Status: None   Collection Time: 01/30/21  7:11 PM   Specimen: BLOOD  Result Value Ref Range Status   Specimen Description   Final    BLOOD BLOOD LEFT HAND Performed at [facility], [place] [address], [place], [place] [address]    Special Requests   Final    BOTTLES DRAWN AEROBIC ONLY Blood Culture adequate volume Performed at [facility] [address] [place], [place] [address]    Culture   Final    NO GROWTH 5 DAYS Performed at [facility], [place] [address], [place], [place] [address]    Report Status 02/05/2021 FINAL  Final  Culture, blood (routine x 2)     Status: None   Collection Time: 01/30/21  7:11 PM   Specimen: BLOOD  Result Value Ref Range Status   Specimen Description   Final    BLOOD LEFT WRIST Performed at [facility] [address] [place], [place] [address]    Special Requests   Final    BOTTLES DRAWN AEROBIC ONLY Blood Culture adequate volume Performed at [facility] [address], [place], [place] [address]    Culture   Final    NO GROWTH 5 DAYS Performed at [facility], [place] [address], [place], [place] [address]    Report Status 02/05/2021 FINAL  Final    Radiology Reports CT ABDOMEN PELVIS WO CONTRAST  Result Date: 01/29/2021 CLINICAL DATA:  Bowel obstruction suspected. Abdominal pain, acute, nonlocalized. End-stage renal disease with previous renal transplant. EXAM: CT ABDOMEN AND PELVIS WITHOUT CONTRAST TECHNIQUE: Multidetector CT imaging of the abdomen and pelvis was performed following the standard protocol without IV  contrast. COMPARISON:  01/26/2021 FINDINGS: Lower chest: Elevation of the right hemidiaphragm with mild atelectasis at the right lung base, similar to the previous study. Extensive coronary artery calcification. Hepatobiliary: Previous cholecystectomy.  No focal  liver lesion. Pancreas: Normal Spleen: Normal Adrenals/Urinary Tract: Adrenal glands are normal. Markedly atrophic native kidneys without significant finding. Small cysts as seen previously. Transplant kidney in the right iliac fossa without abnormal finding by CT. Bladder appears normal. Stomach/Bowel: Stomach and small intestine are normal. Diverticulosis of the colon but no evidence of diverticulitis. No bowel obstruction or focal bowel lesion. Vascular/Lymphatic: Aortic atherosclerosis. No aneurysm. IVC is normal. No retroperitoneal adenopathy. Reproductive: No pelvic mass. Other: No free fluid or air. Musculoskeletal: No significant skeletal finding. IMPRESSION: No bowel obstruction or acute bowel pathology. Diverticulosis without evidence of diverticulitis. Elevation of the right hemidiaphragm with atelectasis at the right lung base. Atrophic native kidneys. Grossly normal appearing transplant kidney in the right iliac fossa. Aortic atherosclerosis. Widespread vascular calcification consistent with chronic renal failure. Electronically Signed   By: Nelson Chimes M.D.   On: 01/29/2021 16:10   CT ABDOMEN PELVIS WO CONTRAST  Result Date: 01/26/2021 CLINICAL DATA:  Concern for bowel obstruction. EXAM: CT ABDOMEN AND PELVIS WITHOUT CONTRAST TECHNIQUE: Multidetector CT imaging of the abdomen and pelvis was performed following the standard protocol without IV contrast. COMPARISON:  CT abdomen pelvis dated 08/21/2020. FINDINGS: Evaluation of this exam is limited in the absence of intravenous contrast. Lower chest: Minimal bibasilar atelectasis. The visualized lung bases are otherwise clear. There is advanced 3 vessel coronary vascular calcification. No intra-abdominal free air or free fluid. Hepatobiliary: The liver is unremarkable. No intrahepatic biliary dilatation. Cholecystectomy. No retained calcified stone noted in the central CBD. Pancreas: Unremarkable. No pancreatic ductal dilatation or surrounding  inflammatory changes. Spleen: Normal in size without focal abnormality. Adrenals/Urinary Tract: The adrenal glands are unremarkable. Atrophic native kidneys. Bilateral renal parenchyma nodularity similar to prior CT may represent residual renal parenchyma. A mass is less likely but not excluded. The visualized ureters appear unremarkable. The urinary bladder is collapsed. There is a right lower quadrant renal transplant. There is no hydronephrosis or nephrolithiasis of the transplant kidney. No peritransplant fluid collection. Stomach/Bowel: There is diffuse colonic diverticulosis without active inflammatory changes. There is mild thickened appearance of the jejunal folds with engorgement of the associated mesentery. Clinical correlation is recommended to evaluate for possibility of enteritis. There is no bowel obstruction. The appendix is normal. Vascular/Lymphatic: Moderate aortoiliac atherosclerotic disease. The IVC is unremarkable. No portal venous gas. There is no adenopathy. Reproductive: The uterus is grossly unremarkable. No adnexal masses. Other: Mild diffuse subcutaneous stranding.  No fluid collection. Musculoskeletal: Osteopenia with degenerative changes of the spine. Multiple old left rib fractures. No acute osseous pathology. IMPRESSION: 1. Mild thickened appearance of the jejunal folds with engorgement of the associated mesentery. Clinical correlation is recommended to evaluate for possibility of enteritis. No bowel obstruction. Normal appendix. 2. Colonic diverticulosis. 3. Atrophic native kidneys with a right lower quadrant renal transplant. No hydronephrosis or nephrolithiasis. 4. Aortic Atherosclerosis (ICD10-I70.0). Electronically Signed   By: Anner Crete M.D.   On: 01/26/2021 20:23   DG Chest 2 View  Result Date: 01/26/2021 CLINICAL DATA:  Lower abdominal pain since Friday, tachycardia EXAM: CHEST - 2 VIEW COMPARISON:  08/28/2020 FINDINGS: Frontal and lateral views of the chest  demonstrate an unremarkable cardiac silhouette. No acute airspace disease, effusion, or pneumothorax. No acute bony abnormalities. IMPRESSION: 1. No acute intrathoracic process. Electronically Signed  By: Randa Ngo M.D.   On: 01/26/2021 22:17   DG Abd 2 Views  Result Date: 01/29/2021 CLINICAL DATA:  Abdominal pain EXAM: ABDOMEN - 2 VIEW COMPARISON:  None. FINDINGS: There is mildly dilated of the cecum. Nondistended small bowel. No acute osseous abnormality. There are soft tissue calcifications consistent with subcutaneous calcification seen on prior CT. IMPRESSION: Mild dilation of the cecum. If there is concern for obstruction recommend CT. Electronically Signed   By: Maurine Simmering M.D.   On: 01/29/2021 09:25   DG Abd Portable 1V  Result Date: 02/03/2021 CLINICAL DATA:  Nasogastric tube placement. EXAM: PORTABLE ABDOMEN - 1 VIEW COMPARISON:  X-ray abdomen 01/29/2021, CT abdomen pelvis 01/29/2021 FINDINGS: Enteric tube with tip overlying the expected region of the pyloric region/first portion of the duodenum. The bowel gas pattern is normal. Status post cholecystectomy. No radio-opaque calculi or other significant radiographic abnormality are seen. Aortic atherosclerosis. IMPRESSION: 1. Enteric tube with tip overlying the expected region of the pyloric region/first portion of the duodenum. 2. Aortic Atherosclerosis (ICD10-I70.0). Electronically Signed   By: Iven Finn M.D.   On: 02/03/2021 16:22   ECHOCARDIOGRAM COMPLETE  Result Date: 02/03/2021    ECHOCARDIOGRAM REPORT   Patient Name:   ASENATH BALASH Date of Exam: 02/03/2021 Medical Rec #:  [id number]    Height:       64.0 in Accession #:    [id number]   Weight:       213.0 lb Date of Birth:  05/03/1961    BSA:          2.009 m Patient Age:    73 years     BP:           163/66 mmHg Patient Gender: F            HR:           89 bpm. Exam Location:  Inpatient Procedure: 2D Echo, Cardiac Doppler and Color Doppler Indications:    R01.1 Murmur   History:        Patient has prior history of Echocardiogram examinations, most                 recent 08/14/2018. CHF, Arrythmias:Tachycardia; Risk                 Factors:Hypertension, Diabetes and Dyslipidemia.  Sonographer:    MH Referring Phys: [place]  1. Left ventricular ejection fraction, by estimation, is 65 to 70%. The left ventricle has normal function. The left ventricle has no regional wall motion abnormalities. There is mild concentric left ventricular hypertrophy. Left ventricular diastolic parameters are consistent with Grade I diastolic dysfunction (impaired relaxation).  2. Right ventricular systolic function is normal. The right ventricular size is normal.  3. The mitral valve is normal in structure. Mild mitral valve regurgitation. No evidence of mitral stenosis.  4. The aortic valve is normal in structure. Aortic valve regurgitation is not visualized. Mild aortic valve sclerosis is present, with no evidence of aortic valve stenosis.  5. The inferior vena cava is normal in size with <50% respiratory variability, suggesting right atrial pressure of 8 mmHg. FINDINGS  Left Ventricle: Left ventricular ejection fraction, by estimation, is 65 to 70%. The left ventricle has normal function. The left ventricle has no regional wall motion abnormalities. The left ventricular internal cavity size was normal in size. There is  mild concentric left ventricular hypertrophy. Left ventricular diastolic parameters are consistent with Grade I  diastolic dysfunction (impaired relaxation). Indeterminate filling pressures. Right Ventricle: The right ventricular size is normal. No increase in right ventricular wall thickness. Right ventricular systolic function is normal. Left Atrium: Left atrial size was normal in size. Right Atrium: Right atrial size was normal in size. Pericardium: There is no evidence of pericardial effusion. Mitral Valve: The mitral valve is normal in structure. Mild to  moderate mitral annular calcification. Mild mitral valve regurgitation. No evidence of mitral valve stenosis. Tricuspid Valve: The tricuspid valve is normal in structure. Tricuspid valve regurgitation is not demonstrated. No evidence of tricuspid stenosis. Aortic Valve: The aortic valve is normal in structure. Aortic valve regurgitation is not visualized. Mild aortic valve sclerosis is present, with no evidence of aortic valve stenosis. Aortic valve mean gradient measures 4.0 mmHg. Aortic valve peak gradient measures 7.4 mmHg. Aortic valve area, by VTI measures 3.13 cm. Pulmonic Valve: The pulmonic valve was normal in structure. Pulmonic valve regurgitation is not visualized. No evidence of pulmonic stenosis. Aorta: The aortic root is normal in size and structure. Venous: The inferior vena cava is normal in size with less than 50% respiratory variability, suggesting right atrial pressure of 8 mmHg. IAS/Shunts: No atrial level shunt detected by color flow Doppler.  LEFT VENTRICLE PLAX 2D LVIDd:         4.00 cm     Diastology LVIDs:         2.70 cm     LV e' medial:    4.57 cm/s LV PW:         1.30 cm     LV E/e' medial:  14.7 LV IVS:        1.70 cm     LV e' lateral:   5.00 cm/s LVOT diam:     2.20 cm     LV E/e' lateral: 13.5 LV SV:         68 LV SV Index:   34 LVOT Area:     3.80 cm  LV Volumes (MOD) LV vol d, MOD A4C: 26.9 ml LV vol s, MOD A4C: 12.0 ml LV SV MOD A4C:     26.9 ml RIGHT VENTRICLE             IVC RV S prime:     10.80 cm/s  IVC diam: 1.70 cm TAPSE (M-mode): 1.6 cm LEFT ATRIUM           Index       RIGHT ATRIUM          Index LA diam:      2.00 cm 1.00 cm/m  RA Area:     7.00 cm LA Vol (A2C): 36.3 ml 18.07 ml/m RA Volume:   12.40 ml 6.17 ml/m LA Vol (A4C): 14.0 ml 6.97 ml/m  AORTIC VALVE AV Area (Vmax):    3.21 cm AV Area (Vmean):   2.97 cm AV Area (VTI):     3.13 cm AV Vmax:           136.00 cm/s AV Vmean:          93.700 cm/s AV VTI:            0.216 m AV Peak Grad:      7.4 mmHg AV Mean  Grad:      4.0 mmHg LVOT Vmax:         115.00 cm/s LVOT Vmean:        73.300 cm/s LVOT VTI:          0.178 m  LVOT/AV VTI ratio: 0.82  AORTA Ao Root diam: 3.10 cm Ao Asc diam:  2.90 cm MITRAL VALVE MV Area (PHT): 3.67 cm     SHUNTS MV E velocity: 67.28 cm/s   Systemic VTI:  0.18 m MV A velocity: 158.00 cm/s  Systemic Diam: 2.20 cm MV E/A ratio:  0.43 Mihai Croitoru MD Electronically signed by Sanda Klein MD Signature Date/Time: 02/03/2021/4:54:42 PM    Final     Lab Data:  CBC: Recent Labs  Lab 02/04/21 0505 02/04/21 1446 02/05/21 0127 02/06/21 0500 02/07/21 0813 02/08/21 0210  WBC 12.5*  --  9.9 10.5 12.7* 14.4*  HGB 11.8* 12.6 10.4* 9.9* 11.4* 11.0*  HCT 36.9 37.0 33.6* 31.7* 36.3 35.9*  MCV 84.8  --  87.0 86.8 86.4 87.8  PLT 262  --  224 216 259 076   Basic Metabolic Panel: Recent Labs  Lab 02/03/21 1711 02/03/21 1711 02/04/21 0505 02/04/21 1446 02/05/21 0127 02/06/21 0500 02/07/21 0813 02/08/21 0210  NA  --    < > 142 138 137 137 143 138  K  --    < > 2.7* 3.2* 3.2* 4.2 3.6 4.9  CL  --    < > 107 101 102 104 107 103  CO2  --   --  30  --  26 29 32 29  GLUCOSE  --    < > 170* 172* 242* 315* 167* 156*  BUN  --    < > <5* <3* 5* 15 13 18   CREATININE  --    < > 0.44 0.40* 0.53 0.53 0.44 0.62  CALCIUM  --   --  8.9  --  8.3* 9.2 9.4 9.1  MG 1.3*  --  1.2*  --  1.6* 1.7 1.3* 2.3  PHOS 1.6*  --  2.2*  --  2.5  --  3.4 3.8   < > = values in this interval not displayed.   GFR: Estimated Creatinine Clearance: 84.9 mL/min (by C-Ruiz formula based on SCr of 0.62 mg/dL). Liver Function Tests: Recent Labs  Lab 02/07/21 0813 02/08/21 0210  ALBUMIN 2.8* 2.9*   No results for input(s): LIPASE, AMYLASE in the last 168 hours.  No results for input(s): AMMONIA in the last 168 hours. Coagulation Profile: Recent Labs  Lab 02/04/21 0505 02/05/21 0127 02/06/21 0500 02/07/21 0813 02/08/21 0210  INR 1.2 1.2 1.3* 1.4* 1.6*   Cardiac Enzymes: No results for input(s): CKTOTAL,  CKMB, CKMBINDEX, TROPONINI in the last 168 hours. BNP (last 3 results) No results for input(s): PROBNP in the last 8760 hours. HbA1C: No results for input(s): HGBA1C in the last 72 hours.  CBG: Recent Labs  Lab 02/07/21 1720 02/07/21 2113 02/08/21 0808 02/08/21 1145 02/08/21 1636  GLUCAP 223* 188* 145* 147* 183*   Lipid Profile: No results for input(s): CHOL, HDL, LDLCALC, TRIG, CHOLHDL, LDLDIRECT in the last 72 hours. Thyroid Function Tests: No results for input(s): TSH, T4TOTAL, FREET4, T3FREE, THYROIDAB in the last 72 hours.  Anemia Panel: No results for input(s): VITAMINB12, FOLATE, FERRITIN, TIBC, IRON, RETICCTPCT in the last 72 hours. Urine analysis:    Component Value Date/Time   COLORURINE YELLOW 01/26/2021 2156   APPEARANCEUR CLEAR 01/26/2021 2156   LABSPEC >1.030 (H) 01/26/2021 2156   PHURINE 6.0 01/26/2021 2156   GLUCOSEU NEGATIVE 01/26/2021 2156   GLUCOSEU NEGATIVE 10/22/2013 1621   HGBUR NEGATIVE 01/26/2021 2156   BILIRUBINUR MODERATE (A) 01/26/2021 2156   KETONESUR 40 (A) 01/26/2021 2156   PROTEINUR TRACE (A)  01/26/2021 2156   UROBILINOGEN 1.0 03/27/2015 1534   NITRITE NEGATIVE 01/26/2021 2156   LEUKOCYTESUR NEGATIVE 01/26/2021 2156     Kathie Dike M.D. [facility] 02/08/2021, 7:23 PM  Available via Epic secure chat 7am-7pm After 7 pm, please refer to night coverage provider listed on amion.

## 2021-02-08 NOTE — Plan of Care (Signed)
  Problem: Education: Goal: Knowledge of General Education information will improve Description: Including pain rating scale, medication(s)/side effects and non-pharmacologic comfort measures Outcome: Progressing   Problem: Health Behavior/Discharge Planning: Goal: Ability to manage health-related needs will improve Outcome: Progressing   Problem: Clinical Measurements: Goal: Ability to maintain clinical measurements within normal limits will improve Outcome: Progressing Goal: Will remain free from infection Outcome: Progressing Goal: Diagnostic test results will improve Outcome: Progressing   Problem: Activity: Goal: Risk for activity intolerance will decrease Outcome: Progressing   Problem: Nutrition: Goal: Adequate nutrition will be maintained Outcome: Progressing   Problem: Elimination: Goal: Will not experience complications related to bowel motility Outcome: Progressing   Problem: Safety: Goal: Ability to remain free from injury will improve Outcome: Progressing   Problem: Skin Integrity: Goal: Risk for impaired skin integrity will decrease Outcome: Progressing

## 2021-02-09 DIAGNOSIS — I5032 Chronic diastolic (congestive) heart failure: Secondary | ICD-10-CM | POA: Diagnosis not present

## 2021-02-09 DIAGNOSIS — K529 Noninfective gastroenteritis and colitis, unspecified: Secondary | ICD-10-CM | POA: Diagnosis not present

## 2021-02-09 DIAGNOSIS — I825Z9 Chronic embolism and thrombosis of unspecified deep veins of unspecified distal lower extremity: Secondary | ICD-10-CM | POA: Diagnosis not present

## 2021-02-09 DIAGNOSIS — A0472 Enterocolitis due to Clostridium difficile, not specified as recurrent: Secondary | ICD-10-CM | POA: Diagnosis not present

## 2021-02-09 LAB — PROTIME-INR
INR: 2.3 — ABNORMAL HIGH (ref 0.8–1.2)
Prothrombin Time: 25.5 seconds — ABNORMAL HIGH (ref 11.4–15.2)

## 2021-02-09 LAB — BASIC METABOLIC PANEL
Anion gap: 4 — ABNORMAL LOW (ref 5–15)
BUN: 21 mg/dL — ABNORMAL HIGH (ref 6–20)
CO2: 31 mmol/L (ref 22–32)
Calcium: 9.4 mg/dL (ref 8.9–10.3)
Chloride: 107 mmol/L (ref 98–111)
Creatinine, Ser: 0.86 mg/dL (ref 0.44–1.00)
GFR, Estimated: >60 mL/min (ref >60)
Glucose, Bld: 122 mg/dL — ABNORMAL HIGH (ref 70–99)
Potassium: 4.5 mmol/L (ref 3.5–5.1)
Sodium: 142 mmol/L (ref 135–145)

## 2021-02-09 LAB — CBC
HCT: 31.7 % — ABNORMAL LOW (ref 36.0–46.0)
Hemoglobin: 9.8 g/dL — ABNORMAL LOW (ref 12.0–15.0)
MCH: 27.2 pg (ref 26.0–34.0)
MCHC: 30.9 g/dL (ref 30.0–36.0)
MCV: 88.1 fL (ref 80.0–100.0)
Platelets: 240 10*3/uL (ref 150–400)
RBC: 3.6 MIL/uL — ABNORMAL LOW (ref 3.87–5.11)
RDW: 16.5 % — ABNORMAL HIGH (ref 11.5–15.5)
WBC: 12.8 10*3/uL — ABNORMAL HIGH (ref 4.0–10.5)
nRBC: 0 % (ref 0.0–0.2)

## 2021-02-09 LAB — GLUCOSE, CAPILLARY
Glucose-Capillary: 180 mg/dL — ABNORMAL HIGH (ref 70–99)
Glucose-Capillary: 123 mg/dL — ABNORMAL HIGH (ref 70–99)
Glucose-Capillary: 177 mg/dL — ABNORMAL HIGH (ref 70–99)
Glucose-Capillary: 194 mg/dL — ABNORMAL HIGH (ref 70–99)
Glucose-Capillary: 179 mg/dL — ABNORMAL HIGH (ref 70–99)

## 2021-02-09 MED ORDER — WARFARIN SODIUM 6 MG PO TABS
6.0000 mg | ORAL_TABLET | Freq: Once | ORAL | Status: AC
  Administered 2021-02-09: 6 mg via ORAL
  Filled 2021-02-09: qty 1

## 2021-02-09 NOTE — Progress Notes (Signed)
ANTICOAGULATION CONSULT NOTE - follow up  Pharmacy Consult for Lovenox & warfarin Indication: H/O DVT/PE  Patient Measurements: Height: 5\' 4"  (162.6 cm) Weight: 96.2 kg (212 lb 1.3 oz) IBW/kg (Calculated) : 54.7 Heparin Dosing Weight: 76 kg  Medications:  Warfarin PTA dosing of 6.5 mg daily  Assessment: 60 y/o F [name] on warfarin PTA for a h/o DVT/PE. Pharmacy consulted to dose heparin until patient consistently tolerating PO. Patient did receive vitamin K 2 mg IV on 8/27.  Warfarin resumed 8/23, no Warfarin 8/24, received 3mg  8/25 > held. Order to resume Warfarin 8/30 > discontinued  8/31 Flex sig at 3pm, heparin stopped 11am.  Heparin and Warfarin resumed after procedure  Today, 02/09/2021 INR now therapeutic after inpatient warfarin doses of 6, 6, 6, 8 and 8mg  CBC: Hgb 9.8 down, Plts stable SCr 0.86 No bleeding or infusion issues noted per nursing   Goal of Therapy:  INR 2-3 Monitor platelets by anticoagulation protocol: Yes   Plan:  Discontinue Lovenox for INR > 2 Warfarin 6mg  today Daily CBC, heparin level, PT/INR Monitor closely for s/sx of bleeding   Adrian Saran, PharmD, BCPS Secure Chat if ?s 02/09/2021 9:50 AM

## 2021-02-09 NOTE — Progress Notes (Signed)
[facility]                                                                              Patient Demographics  Connie Ruiz, is a 60 y.o. female, DOB - 11/19/1960, NLZ:[id number]  Admit date - 01/26/2021   Admitting Physician Connie Ruiz [place] DO  Outpatient Primary MD for the patient is Connie, [name], MD  Outpatient specialists:   LOS - 13  days   Medical records reviewed and are as summarized below:    Chief Complaint  Patient presents with   Abdominal Pain       Brief summary   Patient is 60 year old female, [name], ESRD, now status post renal transplant in 2005, hypothyroidism, gout, GERD, hyperlipidemia, diabetes mellitus type 2, SLE, DVT on warfarin presented from [facility] with abdominal pain and diarrhea for 5 days prior to admission.  Patient reported roughly 7 episodes in a day, watery, no hematochezia or melena.  Also reported generalized weakness, fatigue, no fevers or chills. C. difficile positive.  CT abdomen pelvis showed enteritis.   Assessment & Plan    Principal Problem: Enteritis/diarrhea, abdominal pain -Presented with profuse diarrhea, generalized weakness, abdominal pain - C. difficile PCR Ag + but toxin negative.  Initially high suspicion for C. difficile colitis, so she was started on treatment. -No significant improvement with oral vancomycin. Changed to Dificid on 8/26 - Repeat CT abdomen on 8/25 did not show any bowel obstruction, perforation or toxic megacolon -GI, ID following.  -Patient was initially treated with Dificid for possible C. difficile, although with flex sig results not indicating any pseudomembranes, Dificid was discontinued -Per GI, would consider mycophenolate as possible cause of her GI symptoms -Patient is followed at [facility] by nephrology.  I discussed with Dr. Leonides Schanz on-call for nephrology regarding possibly holding further mycophenolate -Recommendations from the nephrology team  including hold further mycophenolate until she can follow-up in the office in the next few weeks -Review of previous GI records indicate that she did have improvement with Imodium in the past with diarrhea.  She been started on Imodium with improvement -Appears to be tolerating diet   Active Problems: History of ESRD status post renal transplant, 2005 -Continue tacrolimus, pharmacy following.  Tacrolimus level in therapeutic range. -Patient is on prednisone maintenance at 5 mg daily, currently on stress dose steroids.  We will continue to taper prednisone back to maintenance dose -Mycophenolate has been put on hold due to concerns for adverse GI effects.  This will be readdressed by nephrology at [facility] on follow-up  Essential hypertension, sinus tachycardia -Likely due to dehydration from fluid loss with diarrhea -She is on Cardizem and Coreg -Heart rate appears to be stabilizing   History of DVT/PE/CVA -Coumadin was held for endoscopy, INR was reversed -Coumadin has since been restarted and she is on Lovenox bridge -Currently INR 2.3, discontinue further Lovenox   GERD -Continue Pepcid  Hypothyroidism -Continue home dose of Synthroid -TSH 0.5   Diabetes mellitus, type II, IDDM -Hemoglobin A1c 8.2 -CBGs controlled  Hyperlipidemia -Continue atorvastatin  Depression/anxiety -Continue sertraline  History of [name] -Declines any blood products if  needed  Obesity Estimated body mass index is 36.4 kg/m as calculated from the following:   Height as of this encounter: 5\' 4"  (1.626 m).   Weight as of this encounter: 96.2 kg.  Code Status: Full CODE STATUS DVT Prophylaxis:    Resume warfarin/heparin bridge   Level of Care: Level of care: Progressive Family Communication: Updated patient's daughter over the phone  Disposition Plan:     Status is: Inpatient  Remains inpatient appropriate because:Inpatient level of care appropriate due to severity of  illness  Dispo: The patient is from: SNF              Anticipated d/c is to: SNF              Patient currently is not medically stable to d/c.     Difficult to place patient No   Time Spent in minutes 25 minutes  Procedures:  None  Consultants:   Gastroenterology Infectious disease  Antimicrobials:   Anti-infectives (From admission, onward)    Start     Dose/Rate Route Frequency Ordered Stop   01/30/21 2200  fidaxomicin (DIFICID) tablet 200 mg  Status:  Discontinued        200 mg Oral 2 times daily 01/30/21 1832 02/05/21 0906   01/30/21 2000  vancomycin (VANCOCIN) 500 mg in sodium chloride irrigation 0.9 % 100 mL ENEMA  Status:  Discontinued        500 mg Rectal Every 6 hours 01/30/21 1838 02/02/21 1152   01/27/21 2200  cefTRIAXone (ROCEPHIN) 2 Ruiz in sodium chloride 0.9 % 100 mL IVPB  Status:  Discontinued        2 Ruiz 200 mL/hr over 30 Minutes Intravenous Every 24 hours 01/27/21 0956 01/27/21 1136   01/27/21 1200  vancomycin (VANCOCIN) capsule 125 mg  Status:  Discontinued        125 mg Oral 4 times daily 01/27/21 1135 01/30/21 1832   01/27/21 1000  metroNIDAZOLE (FLAGYL) IVPB 500 mg  Status:  Discontinued        500 mg 100 mL/hr over 60 Minutes Intravenous Every 12 hours 01/27/21 0901 01/27/21 1135   01/27/21 0115  cefTRIAXone (ROCEPHIN) 2 Ruiz in sodium chloride 0.9 % 100 mL IVPB        2 Ruiz 200 mL/hr over 30 Minutes Intravenous  Once 01/27/21 0103 01/27/21 0411   01/27/21 0115  metroNIDAZOLE (FLAGYL) IVPB 500 mg  Status:  Discontinued        500 mg 100 mL/hr over 60 Minutes Intravenous Every 8 hours 01/27/21 0103 01/27/21 0901          Medications  Scheduled Meds:  carvedilol  6.25 mg Oral BID WC   diltiazem  360 mg Oral Daily   donepezil  10 mg Oral QHS   famotidine  20 mg Oral BID   fiber  1 packet Oral BID   furosemide  20 mg Oral Daily   insulin aspart  0-5 Units Subcutaneous QHS   insulin aspart  0-9 Units Subcutaneous TID WC   levothyroxine  150 mcg Oral  Q0600   mouth rinse  15 mL Mouth Rinse BID   mirtazapine  30 mg Oral QHS   multivitamin with minerals  1 tablet Oral Daily   predniSONE  5 mg Oral Q breakfast   tacrolimus  2.5 mg Oral Q12H   Warfarin - Pharmacist Dosing Inpatient   Does not apply q1600   Continuous Infusions:  promethazine (PHENERGAN) injection (IM or IVPB)  PRN Meds:.acetaminophen **OR** acetaminophen, diltiazem, fentaNYL (SUBLIMAZE) injection, hydrALAZINE, loperamide, morphine, promethazine (PHENERGAN) injection (IM or IVPB)      Subjective:   Blanca Friend she has not had any vomiting.  Continues to have some abdominal pain.  She reports frequent bowel movements, although does not appear that she has received any Imodium.   Objective:   Vitals:   02/09/21 0500 02/09/21 0922 02/09/21 1103 02/09/21 1714  BP:  132/77 107/67 108/61  Pulse:   77 82  Resp:   18   Temp:   98.5 F (36.9 C)   TempSrc:      SpO2:   94%   Weight: 96.2 kg     Height:        Intake/Output Summary (Last 24 hours) at 02/09/2021 1832 Last data filed at 02/09/2021 1800 Gross per 24 hour  Intake 960 ml  Output 1450 ml  Net -490 ml     Wt Readings from Last 3 Encounters:  02/09/21 96.2 kg  08/21/20 88.9 kg  09/11/19 78.9 kg    Physical Exam General exam: Alert, awake, oriented x 3 Respiratory system: Clear to auscultation. Respiratory effort normal. Cardiovascular system:RRR. No murmurs, rubs, gallops. Gastrointestinal system: Abdomen is nondistended, soft and diffusely tender. No organomegaly or masses felt. Normal bowel sounds heard. Central nervous system: Alert and oriented. No focal neurological deficits. Extremities: 1+ edema bilaterally Skin: No rashes, lesions or ulcers Psychiatry: Judgement and insight appear normal. Mood & affect appropriate.    Data Reviewed:  I have personally reviewed following labs and imaging studies  Micro Results Recent Results (from the past 240 hour(s))  Culture, blood (routine x 2)      Status: None   Collection Time: 01/30/21  7:11 PM   Specimen: BLOOD  Result Value Ref Range Status   Specimen Description   Final    BLOOD BLOOD LEFT HAND Performed at [facility], [place] [address], [place], [place] [address]    Special Requests   Final    BOTTLES DRAWN AEROBIC ONLY Blood Culture adequate volume Performed at [facility] [address], [place], [place] [address]    Culture   Final    NO GROWTH 5 DAYS Performed at [facility], [place] [address] [place], [place] [address]    Report Status 02/05/2021 FINAL  Final  Culture, blood (routine x 2)     Status: None   Collection Time: 01/30/21  7:11 PM   Specimen: BLOOD  Result Value Ref Range Status   Specimen Description   Final    BLOOD LEFT WRIST Performed at [facility] [address], [place], [place] [address]    Special Requests   Final    BOTTLES DRAWN AEROBIC ONLY Blood Culture adequate volume Performed at [facility] [address], [place], [place] [address]    Culture   Final    NO GROWTH 5 DAYS Performed at [facility], [place] [address] [place], [place] [address]    Report Status 02/05/2021 FINAL  Final    Radiology Reports CT ABDOMEN PELVIS WO CONTRAST  Result Date: 01/29/2021 CLINICAL DATA:  Bowel obstruction suspected. Abdominal pain, acute, nonlocalized. End-stage renal disease with previous renal transplant. EXAM: CT ABDOMEN AND PELVIS WITHOUT CONTRAST TECHNIQUE: Multidetector CT imaging of the abdomen and pelvis was performed following the standard protocol without IV contrast. COMPARISON:  01/26/2021 FINDINGS: Lower chest: Elevation of the right hemidiaphragm with mild atelectasis at the  right lung base, similar to the previous study. Extensive coronary artery calcification. Hepatobiliary: Previous cholecystectomy.  No focal liver lesion. Pancreas: Normal Spleen: Normal Adrenals/Urinary Tract: Adrenal  glands are normal. Markedly atrophic native kidneys without significant finding. Small cysts as seen previously. Transplant kidney in the right iliac fossa without abnormal finding by CT. Bladder appears normal. Stomach/Bowel: Stomach and small intestine are normal. Diverticulosis of the colon but no evidence of diverticulitis. No bowel obstruction or focal bowel lesion. Vascular/Lymphatic: Aortic atherosclerosis. No aneurysm. IVC is normal. No retroperitoneal adenopathy. Reproductive: No pelvic mass. Other: No free fluid or air. Musculoskeletal: No significant skeletal finding. IMPRESSION: No bowel obstruction or acute bowel pathology. Diverticulosis without evidence of diverticulitis. Elevation of the right hemidiaphragm with atelectasis at the right lung base. Atrophic native kidneys. Grossly normal appearing transplant kidney in the right iliac fossa. Aortic atherosclerosis. Widespread vascular calcification consistent with chronic renal failure. Electronically Signed   By: Nelson Chimes M.D.   On: 01/29/2021 16:10   CT ABDOMEN PELVIS WO CONTRAST  Result Date: 01/26/2021 CLINICAL DATA:  Concern for bowel obstruction. EXAM: CT ABDOMEN AND PELVIS WITHOUT CONTRAST TECHNIQUE: Multidetector CT imaging of the abdomen and pelvis was performed following the standard protocol without IV contrast. COMPARISON:  CT abdomen pelvis dated 08/21/2020. FINDINGS: Evaluation of this exam is limited in the absence of intravenous contrast. Lower chest: Minimal bibasilar atelectasis. The visualized lung bases are otherwise clear. There is advanced 3 vessel coronary vascular calcification. No intra-abdominal free air or free fluid. Hepatobiliary: The liver is unremarkable. No intrahepatic biliary dilatation. Cholecystectomy. No retained calcified stone noted in the central CBD. Pancreas: Unremarkable. No pancreatic ductal dilatation or surrounding inflammatory changes. Spleen: Normal in size without focal abnormality.  Adrenals/Urinary Tract: The adrenal glands are unremarkable. Atrophic native kidneys. Bilateral renal parenchyma nodularity similar to prior CT may represent residual renal parenchyma. A mass is less likely but not excluded. The visualized ureters appear unremarkable. The urinary bladder is collapsed. There is a right lower quadrant renal transplant. There is no hydronephrosis or nephrolithiasis of the transplant kidney. No peritransplant fluid collection. Stomach/Bowel: There is diffuse colonic diverticulosis without active inflammatory changes. There is mild thickened appearance of the jejunal folds with engorgement of the associated mesentery. Clinical correlation is recommended to evaluate for possibility of enteritis. There is no bowel obstruction. The appendix is normal. Vascular/Lymphatic: Moderate aortoiliac atherosclerotic disease. The IVC is unremarkable. No portal venous gas. There is no adenopathy. Reproductive: The uterus is grossly unremarkable. No adnexal masses. Other: Mild diffuse subcutaneous stranding.  No fluid collection. Musculoskeletal: Osteopenia with degenerative changes of the spine. Multiple old left rib fractures. No acute osseous pathology. IMPRESSION: 1. Mild thickened appearance of the jejunal folds with engorgement of the associated mesentery. Clinical correlation is recommended to evaluate for possibility of enteritis. No bowel obstruction. Normal appendix. 2. Colonic diverticulosis. 3. Atrophic native kidneys with a right lower quadrant renal transplant. No hydronephrosis or nephrolithiasis. 4. Aortic Atherosclerosis (ICD10-I70.0). Electronically Signed   By: Anner Crete M.D.   On: 01/26/2021 20:23   DG Chest 2 View  Result Date: 01/26/2021 CLINICAL DATA:  Lower abdominal pain since Friday, tachycardia EXAM: CHEST - 2 VIEW COMPARISON:  08/28/2020 FINDINGS: Frontal and lateral views of the chest demonstrate an unremarkable cardiac silhouette. No acute airspace disease,  effusion, or pneumothorax. No acute bony abnormalities. IMPRESSION: 1. No acute intrathoracic process. Electronically Signed   By: Randa Ngo M.D.   On: 01/26/2021 22:17   DG Abd 2 Views  Result Date: 01/29/2021 CLINICAL DATA:  Abdominal pain EXAM: ABDOMEN - 2 VIEW COMPARISON:  None. FINDINGS: There is mildly dilated of the cecum. Nondistended small bowel. No acute osseous abnormality. There are soft tissue calcifications consistent with subcutaneous calcification seen on prior CT. IMPRESSION: Mild dilation of the cecum. If there is concern for obstruction recommend CT. Electronically Signed   By: Maurine Simmering M.D.   On: 01/29/2021 09:25   DG Abd Portable 1V  Result Date: 02/03/2021 CLINICAL DATA:  Nasogastric tube placement. EXAM: PORTABLE ABDOMEN - 1 VIEW COMPARISON:  X-ray abdomen 01/29/2021, CT abdomen pelvis 01/29/2021 FINDINGS: Enteric tube with tip overlying the expected region of the pyloric region/first portion of the duodenum. The bowel gas pattern is normal. Status post cholecystectomy. No radio-opaque calculi or other significant radiographic abnormality are seen. Aortic atherosclerosis. IMPRESSION: 1. Enteric tube with tip overlying the expected region of the pyloric region/first portion of the duodenum. 2. Aortic Atherosclerosis (ICD10-I70.0). Electronically Signed   By: Iven Finn M.D.   On: 02/03/2021 16:22   ECHOCARDIOGRAM COMPLETE  Result Date: 02/03/2021    ECHOCARDIOGRAM REPORT   Patient Name:   Connie Ruiz Date of Exam: 02/03/2021 Medical Rec #:  [id number]    Height:       64.0 in Accession #:    [id number]   Weight:       213.0 lb Date of Birth:  26-Jun-1960    BSA:          2.009 m Patient Age:    71 years     BP:           163/66 mmHg Patient Gender: F            HR:           89 bpm. Exam Location:  Inpatient Procedure: 2D Echo, Cardiac Doppler and Color Doppler Indications:    R01.1 Murmur  History:        Patient has prior history of Echocardiogram examinations, most                  recent 08/14/2018. CHF, Arrythmias:Tachycardia; Risk                 Factors:Hypertension, Diabetes and Dyslipidemia.  Sonographer:    MH Referring Phys: [place]  1. Left ventricular ejection fraction, by estimation, is 65 to 70%. The left ventricle has normal function. The left ventricle has no regional wall motion abnormalities. There is mild concentric left ventricular hypertrophy. Left ventricular diastolic parameters are consistent with Grade I diastolic dysfunction (impaired relaxation).  2. Right ventricular systolic function is normal. The right ventricular size is normal.  3. The mitral valve is normal in structure. Mild mitral valve regurgitation. No evidence of mitral stenosis.  4. The aortic valve is normal in structure. Aortic valve regurgitation is not visualized. Mild aortic valve sclerosis is present, with no evidence of aortic valve stenosis.  5. The inferior vena cava is normal in size with <50% respiratory variability, suggesting right atrial pressure of 8 mmHg. FINDINGS  Left Ventricle: Left ventricular ejection fraction, by estimation, is 65 to 70%. The left ventricle has normal function. The left ventricle has no regional wall motion abnormalities. The left ventricular internal cavity size was normal in size. There is  mild concentric left ventricular hypertrophy. Left ventricular diastolic parameters are consistent with Grade I diastolic dysfunction (impaired relaxation). Indeterminate filling pressures. Right Ventricle: The right ventricular size is normal. No increase  in right ventricular wall thickness. Right ventricular systolic function is normal. Left Atrium: Left atrial size was normal in size. Right Atrium: Right atrial size was normal in size. Pericardium: There is no evidence of pericardial effusion. Mitral Valve: The mitral valve is normal in structure. Mild to moderate mitral annular calcification. Mild mitral valve regurgitation. No evidence  of mitral valve stenosis. Tricuspid Valve: The tricuspid valve is normal in structure. Tricuspid valve regurgitation is not demonstrated. No evidence of tricuspid stenosis. Aortic Valve: The aortic valve is normal in structure. Aortic valve regurgitation is not visualized. Mild aortic valve sclerosis is present, with no evidence of aortic valve stenosis. Aortic valve mean gradient measures 4.0 mmHg. Aortic valve peak gradient measures 7.4 mmHg. Aortic valve area, by VTI measures 3.13 cm. Pulmonic Valve: The pulmonic valve was normal in structure. Pulmonic valve regurgitation is not visualized. No evidence of pulmonic stenosis. Aorta: The aortic root is normal in size and structure. Venous: The inferior vena cava is normal in size with less than 50% respiratory variability, suggesting right atrial pressure of 8 mmHg. IAS/Shunts: No atrial level shunt detected by color flow Doppler.  LEFT VENTRICLE PLAX 2D LVIDd:         4.00 cm     Diastology LVIDs:         2.70 cm     LV e' medial:    4.57 cm/s LV PW:         1.30 cm     LV E/e' medial:  14.7 LV IVS:        1.70 cm     LV e' lateral:   5.00 cm/s LVOT diam:     2.20 cm     LV E/e' lateral: 13.5 LV SV:         68 LV SV Index:   34 LVOT Area:     3.80 cm  LV Volumes (MOD) LV vol d, MOD A4C: 26.9 ml LV vol s, MOD A4C: 12.0 ml LV SV MOD A4C:     26.9 ml RIGHT VENTRICLE             IVC RV S prime:     10.80 cm/s  IVC diam: 1.70 cm TAPSE (M-mode): 1.6 cm LEFT ATRIUM           Index       RIGHT ATRIUM          Index LA diam:      2.00 cm 1.00 cm/m  RA Area:     7.00 cm LA Vol (A2C): 36.3 ml 18.07 ml/m RA Volume:   12.40 ml 6.17 ml/m LA Vol (A4C): 14.0 ml 6.97 ml/m  AORTIC VALVE AV Area (Vmax):    3.21 cm AV Area (Vmean):   2.97 cm AV Area (VTI):     3.13 cm AV Vmax:           136.00 cm/s AV Vmean:          93.700 cm/s AV VTI:            0.216 m AV Peak Grad:      7.4 mmHg AV Mean Grad:      4.0 mmHg LVOT Vmax:         115.00 cm/s LVOT Vmean:        73.300 cm/s  LVOT VTI:          0.178 m LVOT/AV VTI ratio: 0.82  AORTA Ao Root diam: 3.10 cm Ao Asc diam:  2.90 cm  MITRAL VALVE MV Area (PHT): 3.67 cm     SHUNTS MV E velocity: 67.28 cm/s   Systemic VTI:  0.18 m MV A velocity: 158.00 cm/s  Systemic Diam: 2.20 cm MV E/A ratio:  0.43 Mihai Croitoru MD Electronically signed by Sanda Klein MD Signature Date/Time: 02/03/2021/4:54:42 PM    Final     Lab Data:  CBC: Recent Labs  Lab 02/05/21 0127 02/06/21 0500 02/07/21 0813 02/08/21 0210 02/09/21 0427  WBC 9.9 10.5 12.7* 14.4* 12.8*  HGB 10.4* 9.9* 11.4* 11.0* 9.8*  HCT 33.6* 31.7* 36.3 35.9* 31.7*  MCV 87.0 86.8 86.4 87.8 88.1  PLT 224 216 259 253 962   Basic Metabolic Panel: Recent Labs  Lab 02/03/21 1711 02/03/21 1711 02/04/21 0505 02/04/21 1446 02/05/21 0127 02/06/21 0500 02/07/21 0813 02/08/21 0210 02/09/21 0427  NA  --   --  142   < > 137 137 143 138 142  K  --   --  2.7*   < > 3.2* 4.2 3.6 4.9 4.5  CL  --   --  107   < > 102 104 107 103 107  CO2  --    < > 30  --  26 29 32 29 31  GLUCOSE  --   --  170*   < > 242* 315* 167* 156* 122*  BUN  --   --  <5*   < > 5* 15 13 18  21*  CREATININE  --   --  0.44   < > 0.53 0.53 0.44 0.62 0.86  CALCIUM  --    < > 8.9  --  8.3* 9.2 9.4 9.1 9.4  MG 1.3*  --  1.2*  --  1.6* 1.7 1.3* 2.3  --   PHOS 1.6*  --  2.2*  --  2.5  --  3.4 3.8  --    < > = values in this interval not displayed.   GFR: Estimated Creatinine Clearance: 78.3 mL/min (by C-Ruiz formula based on SCr of 0.86 mg/dL). Liver Function Tests: Recent Labs  Lab 02/07/21 0813 02/08/21 0210  ALBUMIN 2.8* 2.9*   No results for input(s): LIPASE, AMYLASE in the last 168 hours.  No results for input(s): AMMONIA in the last 168 hours. Coagulation Profile: Recent Labs  Lab 02/05/21 0127 02/06/21 0500 02/07/21 0813 02/08/21 0210 02/09/21 0427  INR 1.2 1.3* 1.4* 1.6* 2.3*   Cardiac Enzymes: No results for input(s): CKTOTAL, CKMB, CKMBINDEX, TROPONINI in the last 168 hours. BNP  (last 3 results) No results for input(s): PROBNP in the last 8760 hours. HbA1C: No results for input(s): HGBA1C in the last 72 hours.  CBG: Recent Labs  Lab 02/08/21 1636 02/08/21 2010 02/09/21 0700 02/09/21 1059 02/09/21 1602  GLUCAP 183* 176* 123* 177* 194*   Lipid Profile: No results for input(s): CHOL, HDL, LDLCALC, TRIG, CHOLHDL, LDLDIRECT in the last 72 hours. Thyroid Function Tests: No results for input(s): TSH, T4TOTAL, FREET4, T3FREE, THYROIDAB in the last 72 hours.  Anemia Panel: No results for input(s): VITAMINB12, FOLATE, FERRITIN, TIBC, IRON, RETICCTPCT in the last 72 hours. Urine analysis:    Component Value Date/Time   COLORURINE YELLOW 01/26/2021 2156   APPEARANCEUR CLEAR 01/26/2021 2156   LABSPEC >1.030 (H) 01/26/2021 2156   PHURINE 6.0 01/26/2021 2156   GLUCOSEU NEGATIVE 01/26/2021 2156   GLUCOSEU NEGATIVE 10/22/2013 1621   HGBUR NEGATIVE 01/26/2021 2156   BILIRUBINUR MODERATE (A) 01/26/2021 2156   KETONESUR 40 (A) 01/26/2021 2156   PROTEINUR TRACE (A)  01/26/2021 2156   UROBILINOGEN 1.0 03/27/2015 1534   NITRITE NEGATIVE 01/26/2021 2156   LEUKOCYTESUR NEGATIVE 01/26/2021 2156     Kathie Dike M.D. [facility] 02/09/2021, 6:32 PM  Available via Epic secure chat 7am-7pm After 7 pm, please refer to night coverage provider listed on amion.

## 2021-02-09 NOTE — Plan of Care (Signed)
?  Problem: Health Behavior/Discharge Planning: ?Goal: Ability to manage health-related needs will improve ?Outcome: Progressing ?  ?Problem: Nutrition: ?Goal: Adequate nutrition will be maintained ?Outcome: Progressing ?  ?Problem: Safety: ?Goal: Ability to remain free from injury will improve ?Outcome: Progressing ?  ?Problem: Skin Integrity: ?Goal: Risk for impaired skin integrity will decrease ?Outcome: Progressing ?  ?

## 2021-02-10 DIAGNOSIS — I5032 Chronic diastolic (congestive) heart failure: Secondary | ICD-10-CM | POA: Diagnosis not present

## 2021-02-10 DIAGNOSIS — K529 Noninfective gastroenteritis and colitis, unspecified: Secondary | ICD-10-CM | POA: Diagnosis not present

## 2021-02-10 DIAGNOSIS — A0472 Enterocolitis due to Clostridium difficile, not specified as recurrent: Secondary | ICD-10-CM | POA: Diagnosis not present

## 2021-02-10 DIAGNOSIS — I825Z9 Chronic embolism and thrombosis of unspecified deep veins of unspecified distal lower extremity: Secondary | ICD-10-CM | POA: Diagnosis not present

## 2021-02-10 LAB — BASIC METABOLIC PANEL WITH GFR
Anion gap: 5 (ref 5–15)
BUN: 20 mg/dL (ref 6–20)
CO2: 32 mmol/L (ref 22–32)
Calcium: 9.2 mg/dL (ref 8.9–10.3)
Chloride: 107 mmol/L (ref 98–111)
Creatinine, Ser: 0.7 mg/dL (ref 0.44–1.00)
GFR, Estimated: >60 mL/min
Glucose, Bld: 120 mg/dL — ABNORMAL HIGH (ref 70–99)
Potassium: 4.3 mmol/L (ref 3.5–5.1)
Sodium: 144 mmol/L (ref 135–145)

## 2021-02-10 LAB — CBC
HCT: 33.9 % — ABNORMAL LOW (ref 36.0–46.0)
Hemoglobin: 10.5 g/dL — ABNORMAL LOW (ref 12.0–15.0)
MCH: 27.2 pg (ref 26.0–34.0)
MCHC: 31 g/dL (ref 30.0–36.0)
MCV: 87.8 fL (ref 80.0–100.0)
Platelets: 210 K/uL (ref 150–400)
RBC: 3.86 MIL/uL — ABNORMAL LOW (ref 3.87–5.11)
RDW: 16.7 % — ABNORMAL HIGH (ref 11.5–15.5)
WBC: 11.2 K/uL — ABNORMAL HIGH (ref 4.0–10.5)
nRBC: 0 % (ref 0.0–0.2)

## 2021-02-10 LAB — PROTIME-INR
INR: 2.7 — ABNORMAL HIGH (ref 0.8–1.2)
Prothrombin Time: 28.9 seconds — ABNORMAL HIGH (ref 11.4–15.2)

## 2021-02-10 LAB — GLUCOSE, CAPILLARY
Glucose-Capillary: 120 mg/dL — ABNORMAL HIGH (ref 70–99)
Glucose-Capillary: 175 mg/dL — ABNORMAL HIGH (ref 70–99)
Glucose-Capillary: 181 mg/dL — ABNORMAL HIGH (ref 70–99)

## 2021-02-10 MED ORDER — BOOST / RESOURCE BREEZE PO LIQD CUSTOM
1.0000 | Freq: Two times a day (BID) | ORAL | Status: DC
  Administered 2021-02-10 – 2021-02-13 (×6): 1 via ORAL

## 2021-02-10 MED ORDER — WARFARIN SODIUM 2 MG PO TABS
2.0000 mg | ORAL_TABLET | Freq: Once | ORAL | Status: AC
  Administered 2021-02-10: 2 mg via ORAL
  Filled 2021-02-10: qty 1

## 2021-02-10 MED ORDER — ENSURE ENLIVE PO LIQD
237.0000 mL | ORAL | Status: DC
  Administered 2021-02-10 – 2021-02-13 (×3): 237 mL via ORAL

## 2021-02-10 MED ORDER — DICYCLOMINE HCL 10 MG PO CAPS
10.0000 mg | ORAL_CAPSULE | Freq: Three times a day (TID) | ORAL | Status: DC
  Administered 2021-02-10 – 2021-02-13 (×10): 10 mg via ORAL
  Filled 2021-02-10 (×10): qty 1

## 2021-02-10 NOTE — Care Management Important Message (Signed)
Important Message  Patient Details IM Letter given to the Patient. Name: Connie Ruiz MRN: [id number] Date of Birth: [birth year]   Medicare Important Message Given:  Yes     Kerin Salen 02/10/2021, 1:17 PM

## 2021-02-10 NOTE — Progress Notes (Signed)
[facility]                                                                              Patient Demographics  Connie Ruiz, is a 60 y.o. female, DOB - 09/24/1960, PRF:[id number]  Admit date - 01/26/2021   Admitting Physician Donnamarie Poag [place] DO  Outpatient Primary MD for the patient is [name], [name], MD  Outpatient specialists:   LOS - 14  days   Medical records reviewed and are as summarized below:    Chief Complaint  Patient presents with   Abdominal Pain       Brief summary   Patient is 60 year old female, [name], ESRD, now status post renal transplant in 2005, hypothyroidism, gout, GERD, hyperlipidemia, diabetes mellitus type 2, SLE, DVT on warfarin presented from [facility] with abdominal pain and diarrhea for 5 days prior to admission.  Patient reported roughly 7 episodes in a day, watery, no hematochezia or melena.  Also reported generalized weakness, fatigue, no fevers or chills. C. difficile positive.  CT abdomen pelvis showed enteritis.   Assessment & Plan    Principal Problem: Enteritis/diarrhea, abdominal pain -Presented with profuse diarrhea, generalized weakness, abdominal pain - C. difficile PCR Ag + but toxin negative.  Initially high suspicion for C. difficile colitis, so she was started on treatment. -No significant improvement with oral vancomycin. Changed to Dificid on 8/26 - Repeat CT abdomen on 8/25 did not show any bowel obstruction, perforation or toxic megacolon -GI, ID following.  -Patient was initially treated with Dificid for possible C. difficile, although with flex sig results not indicating any pseudomembranes, Dificid was discontinued -Per GI, would consider mycophenolate as possible cause of her GI symptoms -Patient is followed at [facility] by nephrology.  I discussed with Dr. Leonides Schanz on-call for nephrology regarding possibly holding further mycophenolate -Recommendations from the nephrology team  including hold further mycophenolate until she can follow-up in the office in the next few weeks -Review of previous GI records indicate that she did have improvement with Imodium in the past with diarrhea.  She been started on Imodium prn with improvement -Appears to be tolerating diet -added bentyl for cramping abdominal pain -abdominal pain does appear to have a chronic component since she was on oral morphine prior to admission   Active Problems: History of ESRD status post renal transplant, 2005 -Continue tacrolimus, pharmacy following.  Tacrolimus level in therapeutic range. -Patient is on prednisone maintenance at 5 mg daily -Mycophenolate has been put on hold due to concerns for adverse GI effects.  This will be readdressed by nephrology at [facility] on follow-up  Essential hypertension, sinus tachycardia -Likely due to dehydration from fluid loss with diarrhea -She is on Cardizem and Coreg -Heart rate appears to be stabilizing   History of DVT/PE/CVA -INR therapeutic on coumadin   GERD -Continue Pepcid  Hypothyroidism -Continue home dose of Synthroid -TSH 0.5   Diabetes mellitus, type II, IDDM -Hemoglobin A1c 8.2 -CBGs controlled  Hyperlipidemia -Continue atorvastatin  Depression/anxiety -Continue sertraline  History of [name] -Declines any blood products if needed  Obesity Estimated body mass index is 36.82 kg/m as calculated  from the following:   Height as of this encounter: 5\' 4"  (1.626 m).   Weight as of this encounter: 97.3 kg.  Code Status: Full CODE STATUS DVT Prophylaxis:    warfarin   Level of Care: Level of care: Progressive Family Communication: Updated patient's daughter over the phone  Disposition Plan:     Status is: Inpatient  Remains inpatient appropriate because:Inpatient level of care appropriate due to severity of illness  Dispo: The patient is from: SNF              Anticipated d/c is to: SNF              Patient  currently is not medically stable to d/c.     Difficult to place patient No   Time Spent in minutes 25 minutes  Procedures:  None  Consultants:   Gastroenterology Infectious disease  Antimicrobials:   Anti-infectives (From admission, onward)    Start     Dose/Rate Route Frequency Ordered Stop   01/30/21 2200  fidaxomicin (DIFICID) tablet 200 mg  Status:  Discontinued        200 mg Oral 2 times daily 01/30/21 1832 02/05/21 0906   01/30/21 2000  vancomycin (VANCOCIN) 500 mg in sodium chloride irrigation 0.9 % 100 mL ENEMA  Status:  Discontinued        500 mg Rectal Every 6 hours 01/30/21 1838 02/02/21 1152   01/27/21 2200  cefTRIAXone (ROCEPHIN) 2 g in sodium chloride 0.9 % 100 mL IVPB  Status:  Discontinued        2 g 200 mL/hr over 30 Minutes Intravenous Every 24 hours 01/27/21 0956 01/27/21 1136   01/27/21 1200  vancomycin (VANCOCIN) capsule 125 mg  Status:  Discontinued        125 mg Oral 4 times daily 01/27/21 1135 01/30/21 1832   01/27/21 1000  metroNIDAZOLE (FLAGYL) IVPB 500 mg  Status:  Discontinued        500 mg 100 mL/hr over 60 Minutes Intravenous Every 12 hours 01/27/21 0901 01/27/21 1135   01/27/21 0115  cefTRIAXone (ROCEPHIN) 2 g in sodium chloride 0.9 % 100 mL IVPB        2 g 200 mL/hr over 30 Minutes Intravenous  Once 01/27/21 0103 01/27/21 0411   01/27/21 0115  metroNIDAZOLE (FLAGYL) IVPB 500 mg  Status:  Discontinued        500 mg 100 mL/hr over 60 Minutes Intravenous Every 8 hours 01/27/21 0103 01/27/21 0901          Medications  Scheduled Meds:  carvedilol  6.25 mg Oral BID WC   dicyclomine  10 mg Oral TID AC   diltiazem  360 mg Oral Daily   donepezil  10 mg Oral QHS   famotidine  20 mg Oral BID   feeding supplement  1 Container Oral BID BM   feeding supplement  237 mL Oral Q24H   fiber  1 packet Oral BID   furosemide  20 mg Oral Daily   insulin aspart  0-5 Units Subcutaneous QHS   insulin aspart  0-9 Units Subcutaneous TID WC   levothyroxine   150 mcg Oral Q0600   mouth rinse  15 mL Mouth Rinse BID   mirtazapine  30 mg Oral QHS   multivitamin with minerals  1 tablet Oral Daily   predniSONE  5 mg Oral Q breakfast   tacrolimus  2.5 mg Oral Q12H   Warfarin - Pharmacist Dosing Inpatient   Does not apply  q1600   Continuous Infusions:  promethazine (PHENERGAN) injection (IM or IVPB)     PRN Meds:.acetaminophen **OR** acetaminophen, diltiazem, fentaNYL (SUBLIMAZE) injection, hydrALAZINE, loperamide, morphine, promethazine (PHENERGAN) injection (IM or IVPB)      Subjective:   Martrice Johanson is not having any vomiting. No bowel movements today per staff.   Objective:   Vitals:   02/10/21 0840 02/10/21 0845 02/10/21 1123 02/10/21 2045  BP: 127/78  140/67 135/75  Pulse:  71 76 78  Resp:   18 18  Temp:   98.9 F (37.2 C) 100.2 F (37.9 C)  TempSrc:    Oral  SpO2:   95% 99%  Weight:      Height:        Intake/Output Summary (Last 24 hours) at 02/10/2021 2126 Last data filed at 02/10/2021 1800 Gross per 24 hour  Intake 840 ml  Output 800 ml  Net 40 ml     Wt Readings from Last 3 Encounters:  02/10/21 97.3 kg  08/21/20 88.9 kg  09/11/19 78.9 kg    Physical Exam General exam: Alert, awake, oriented x 3 Respiratory system: Clear to auscultation. Respiratory effort normal. Cardiovascular system:RRR. No murmurs, rubs, gallops. Gastrointestinal system: Abdomen is nondistended, soft and diffusely tender. No organomegaly or masses felt. Normal bowel sounds heard. Central nervous system: Alert and oriented. No focal neurological deficits. Extremities: 1+ edema bilaterally Skin: No rashes, lesions or ulcers Psychiatry: Judgement and insight appear normal. Mood & affect appropriate.    Data Reviewed:  I have personally reviewed following labs and imaging studies  Micro Results No results found for this or any previous visit (from the past 240 hour(s)).   Radiology Reports CT ABDOMEN PELVIS WO CONTRAST  Result Date:  01/29/2021 CLINICAL DATA:  Bowel obstruction suspected. Abdominal pain, acute, nonlocalized. End-stage renal disease with previous renal transplant. EXAM: CT ABDOMEN AND PELVIS WITHOUT CONTRAST TECHNIQUE: Multidetector CT imaging of the abdomen and pelvis was performed following the standard protocol without IV contrast. COMPARISON:  01/26/2021 FINDINGS: Lower chest: Elevation of the right hemidiaphragm with mild atelectasis at the right lung base, similar to the previous study. Extensive coronary artery calcification. Hepatobiliary: Previous cholecystectomy.  No focal liver lesion. Pancreas: Normal Spleen: Normal Adrenals/Urinary Tract: Adrenal glands are normal. Markedly atrophic native kidneys without significant finding. Small cysts as seen previously. Transplant kidney in the right iliac fossa without abnormal finding by CT. Bladder appears normal. Stomach/Bowel: Stomach and small intestine are normal. Diverticulosis of the colon but no evidence of diverticulitis. No bowel obstruction or focal bowel lesion. Vascular/Lymphatic: Aortic atherosclerosis. No aneurysm. IVC is normal. No retroperitoneal adenopathy. Reproductive: No pelvic mass. Other: No free fluid or air. Musculoskeletal: No significant skeletal finding. IMPRESSION: No bowel obstruction or acute bowel pathology. Diverticulosis without evidence of diverticulitis. Elevation of the right hemidiaphragm with atelectasis at the right lung base. Atrophic native kidneys. Grossly normal appearing transplant kidney in the right iliac fossa. Aortic atherosclerosis. Widespread vascular calcification consistent with chronic renal failure. Electronically Signed   By: Nelson Chimes M.D.   On: 01/29/2021 16:10   CT ABDOMEN PELVIS WO CONTRAST  Result Date: 01/26/2021 CLINICAL DATA:  Concern for bowel obstruction. EXAM: CT ABDOMEN AND PELVIS WITHOUT CONTRAST TECHNIQUE: Multidetector CT imaging of the abdomen and pelvis was performed following the standard protocol  without IV contrast. COMPARISON:  CT abdomen pelvis dated 08/21/2020. FINDINGS: Evaluation of this exam is limited in the absence of intravenous contrast. Lower chest: Minimal bibasilar atelectasis. The visualized lung bases are otherwise  clear. There is advanced 3 vessel coronary vascular calcification. No intra-abdominal free air or free fluid. Hepatobiliary: The liver is unremarkable. No intrahepatic biliary dilatation. Cholecystectomy. No retained calcified stone noted in the central CBD. Pancreas: Unremarkable. No pancreatic ductal dilatation or surrounding inflammatory changes. Spleen: Normal in size without focal abnormality. Adrenals/Urinary Tract: The adrenal glands are unremarkable. Atrophic native kidneys. Bilateral renal parenchyma nodularity similar to prior CT may represent residual renal parenchyma. A mass is less likely but not excluded. The visualized ureters appear unremarkable. The urinary bladder is collapsed. There is a right lower quadrant renal transplant. There is no hydronephrosis or nephrolithiasis of the transplant kidney. No peritransplant fluid collection. Stomach/Bowel: There is diffuse colonic diverticulosis without active inflammatory changes. There is mild thickened appearance of the jejunal folds with engorgement of the associated mesentery. Clinical correlation is recommended to evaluate for possibility of enteritis. There is no bowel obstruction. The appendix is normal. Vascular/Lymphatic: Moderate aortoiliac atherosclerotic disease. The IVC is unremarkable. No portal venous gas. There is no adenopathy. Reproductive: The uterus is grossly unremarkable. No adnexal masses. Other: Mild diffuse subcutaneous stranding.  No fluid collection. Musculoskeletal: Osteopenia with degenerative changes of the spine. Multiple old left rib fractures. No acute osseous pathology. IMPRESSION: 1. Mild thickened appearance of the jejunal folds with engorgement of the associated mesentery. Clinical  correlation is recommended to evaluate for possibility of enteritis. No bowel obstruction. Normal appendix. 2. Colonic diverticulosis. 3. Atrophic native kidneys with a right lower quadrant renal transplant. No hydronephrosis or nephrolithiasis. 4. Aortic Atherosclerosis (ICD10-I70.0). Electronically Signed   By: Anner Crete M.D.   On: 01/26/2021 20:23   DG Chest 2 View  Result Date: 01/26/2021 CLINICAL DATA:  Lower abdominal pain since Friday, tachycardia EXAM: CHEST - 2 VIEW COMPARISON:  08/28/2020 FINDINGS: Frontal and lateral views of the chest demonstrate an unremarkable cardiac silhouette. No acute airspace disease, effusion, or pneumothorax. No acute bony abnormalities. IMPRESSION: 1. No acute intrathoracic process. Electronically Signed   By: Randa Ngo M.D.   On: 01/26/2021 22:17   DG Abd 2 Views  Result Date: 01/29/2021 CLINICAL DATA:  Abdominal pain EXAM: ABDOMEN - 2 VIEW COMPARISON:  None. FINDINGS: There is mildly dilated of the cecum. Nondistended small bowel. No acute osseous abnormality. There are soft tissue calcifications consistent with subcutaneous calcification seen on prior CT. IMPRESSION: Mild dilation of the cecum. If there is concern for obstruction recommend CT. Electronically Signed   By: Maurine Simmering M.D.   On: 01/29/2021 09:25   DG Abd Portable 1V  Result Date: 02/03/2021 CLINICAL DATA:  Nasogastric tube placement. EXAM: PORTABLE ABDOMEN - 1 VIEW COMPARISON:  X-ray abdomen 01/29/2021, CT abdomen pelvis 01/29/2021 FINDINGS: Enteric tube with tip overlying the expected region of the pyloric region/first portion of the duodenum. The bowel gas pattern is normal. Status post cholecystectomy. No radio-opaque calculi or other significant radiographic abnormality are seen. Aortic atherosclerosis. IMPRESSION: 1. Enteric tube with tip overlying the expected region of the pyloric region/first portion of the duodenum. 2. Aortic Atherosclerosis (ICD10-I70.0). Electronically Signed    By: Iven Finn M.D.   On: 02/03/2021 16:22   ECHOCARDIOGRAM COMPLETE  Result Date: 02/03/2021    ECHOCARDIOGRAM REPORT   Patient Name:   KLARYSSA FAUTH Date of Exam: 02/03/2021 Medical Rec #:  [id number]    Height:       64.0 in Accession #:    [id number]   Weight:       213.0 lb Date of Birth:  1960-06-30  BSA:          2.009 m Patient Age:    60 years     BP:           163/66 mmHg Patient Gender: F            HR:           89 bpm. Exam Location:  Inpatient Procedure: 2D Echo, Cardiac Doppler and Color Doppler Indications:    R01.1 Murmur  History:        Patient has prior history of Echocardiogram examinations, most                 recent 08/14/2018. CHF, Arrythmias:Tachycardia; Risk                 Factors:Hypertension, Diabetes and Dyslipidemia.  Sonographer:    MH Referring Phys: [place]  1. Left ventricular ejection fraction, by estimation, is 65 to 70%. The left ventricle has normal function. The left ventricle has no regional wall motion abnormalities. There is mild concentric left ventricular hypertrophy. Left ventricular diastolic parameters are consistent with Grade I diastolic dysfunction (impaired relaxation).  2. Right ventricular systolic function is normal. The right ventricular size is normal.  3. The mitral valve is normal in structure. Mild mitral valve regurgitation. No evidence of mitral stenosis.  4. The aortic valve is normal in structure. Aortic valve regurgitation is not visualized. Mild aortic valve sclerosis is present, with no evidence of aortic valve stenosis.  5. The inferior vena cava is normal in size with <50% respiratory variability, suggesting right atrial pressure of 8 mmHg. FINDINGS  Left Ventricle: Left ventricular ejection fraction, by estimation, is 65 to 70%. The left ventricle has normal function. The left ventricle has no regional wall motion abnormalities. The left ventricular internal cavity size was normal in size. There is  mild  concentric left ventricular hypertrophy. Left ventricular diastolic parameters are consistent with Grade I diastolic dysfunction (impaired relaxation). Indeterminate filling pressures. Right Ventricle: The right ventricular size is normal. No increase in right ventricular wall thickness. Right ventricular systolic function is normal. Left Atrium: Left atrial size was normal in size. Right Atrium: Right atrial size was normal in size. Pericardium: There is no evidence of pericardial effusion. Mitral Valve: The mitral valve is normal in structure. Mild to moderate mitral annular calcification. Mild mitral valve regurgitation. No evidence of mitral valve stenosis. Tricuspid Valve: The tricuspid valve is normal in structure. Tricuspid valve regurgitation is not demonstrated. No evidence of tricuspid stenosis. Aortic Valve: The aortic valve is normal in structure. Aortic valve regurgitation is not visualized. Mild aortic valve sclerosis is present, with no evidence of aortic valve stenosis. Aortic valve mean gradient measures 4.0 mmHg. Aortic valve peak gradient measures 7.4 mmHg. Aortic valve area, by VTI measures 3.13 cm. Pulmonic Valve: The pulmonic valve was normal in structure. Pulmonic valve regurgitation is not visualized. No evidence of pulmonic stenosis. Aorta: The aortic root is normal in size and structure. Venous: The inferior vena cava is normal in size with less than 50% respiratory variability, suggesting right atrial pressure of 8 mmHg. IAS/Shunts: No atrial level shunt detected by color flow Doppler.  LEFT VENTRICLE PLAX 2D LVIDd:         4.00 cm     Diastology LVIDs:         2.70 cm     LV e' medial:    4.57 cm/s LV PW:  1.30 cm     LV E/e' medial:  14.7 LV IVS:        1.70 cm     LV e' lateral:   5.00 cm/s LVOT diam:     2.20 cm     LV E/e' lateral: 13.5 LV SV:         68 LV SV Index:   34 LVOT Area:     3.80 cm  LV Volumes (MOD) LV vol d, MOD A4C: 26.9 ml LV vol s, MOD A4C: 12.0 ml LV SV MOD  A4C:     26.9 ml RIGHT VENTRICLE             IVC RV S prime:     10.80 cm/s  IVC diam: 1.70 cm TAPSE (M-mode): 1.6 cm LEFT ATRIUM           Index       RIGHT ATRIUM          Index LA diam:      2.00 cm 1.00 cm/m  RA Area:     7.00 cm LA Vol (A2C): 36.3 ml 18.07 ml/m RA Volume:   12.40 ml 6.17 ml/m LA Vol (A4C): 14.0 ml 6.97 ml/m  AORTIC VALVE AV Area (Vmax):    3.21 cm AV Area (Vmean):   2.97 cm AV Area (VTI):     3.13 cm AV Vmax:           136.00 cm/s AV Vmean:          93.700 cm/s AV VTI:            0.216 m AV Peak Grad:      7.4 mmHg AV Mean Grad:      4.0 mmHg LVOT Vmax:         115.00 cm/s LVOT Vmean:        73.300 cm/s LVOT VTI:          0.178 m LVOT/AV VTI ratio: 0.82  AORTA Ao Root diam: 3.10 cm Ao Asc diam:  2.90 cm MITRAL VALVE MV Area (PHT): 3.67 cm     SHUNTS MV E velocity: 67.28 cm/s   Systemic VTI:  0.18 m MV A velocity: 158.00 cm/s  Systemic Diam: 2.20 cm MV E/A ratio:  0.43 Mihai Croitoru MD Electronically signed by Sanda Klein MD Signature Date/Time: 02/03/2021/4:54:42 PM    Final     Lab Data:  CBC: Recent Labs  Lab 02/06/21 0500 02/07/21 0813 02/08/21 0210 02/09/21 0427 02/10/21 0414  WBC 10.5 12.7* 14.4* 12.8* 11.2*  HGB 9.9* 11.4* 11.0* 9.8* 10.5*  HCT 31.7* 36.3 35.9* 31.7* 33.9*  MCV 86.8 86.4 87.8 88.1 87.8  PLT 216 259 253 240 170   Basic Metabolic Panel: Recent Labs  Lab 02/04/21 0505 02/04/21 1446 02/05/21 0127 02/06/21 0500 02/07/21 0813 02/08/21 0210 02/09/21 0427 02/10/21 0414  NA 142   < > 137 137 143 138 142 144  K 2.7*   < > 3.2* 4.2 3.6 4.9 4.5 4.3  CL 107   < > 102 104 107 103 107 107  CO2 30  --  26 29 32 29 31 32  GLUCOSE 170*   < > 242* 315* 167* 156* 122* 120*  BUN <5*   < > 5* 15 13 18  21* 20  CREATININE 0.44   < > 0.53 0.53 0.44 0.62 0.86 0.70  CALCIUM 8.9  --  8.3* 9.2 9.4 9.1 9.4 9.2  MG 1.2*  --  1.6* 1.7 1.3*  2.3  --   --   PHOS 2.2*  --  2.5  --  3.4 3.8  --   --    < > = values in this interval not displayed.    GFR: Estimated Creatinine Clearance: 84.6 mL/min (by C-G formula based on SCr of 0.7 mg/dL). Liver Function Tests: Recent Labs  Lab 02/07/21 0813 02/08/21 0210  ALBUMIN 2.8* 2.9*   No results for input(s): LIPASE, AMYLASE in the last 168 hours.  No results for input(s): AMMONIA in the last 168 hours. Coagulation Profile: Recent Labs  Lab 02/06/21 0500 02/07/21 0813 02/08/21 0210 02/09/21 0427 02/10/21 0414  INR 1.3* 1.4* 1.6* 2.3* 2.7*   Cardiac Enzymes: No results for input(s): CKTOTAL, CKMB, CKMBINDEX, TROPONINI in the last 168 hours. BNP (last 3 results) No results for input(s): PROBNP in the last 8760 hours. HbA1C: No results for input(s): HGBA1C in the last 72 hours.  CBG: Recent Labs  Lab 02/09/21 1602 02/09/21 2039 02/10/21 0738 02/10/21 1120 02/10/21 2049  GLUCAP 194* 179* 120* 175* 181*   Lipid Profile: No results for input(s): CHOL, HDL, LDLCALC, TRIG, CHOLHDL, LDLDIRECT in the last 72 hours. Thyroid Function Tests: No results for input(s): TSH, T4TOTAL, FREET4, T3FREE, THYROIDAB in the last 72 hours.  Anemia Panel: No results for input(s): VITAMINB12, FOLATE, FERRITIN, TIBC, IRON, RETICCTPCT in the last 72 hours. Urine analysis:    Component Value Date/Time   COLORURINE YELLOW 01/26/2021 2156   APPEARANCEUR CLEAR 01/26/2021 2156   LABSPEC >1.030 (H) 01/26/2021 2156   PHURINE 6.0 01/26/2021 2156   GLUCOSEU NEGATIVE 01/26/2021 2156   GLUCOSEU NEGATIVE 10/22/2013 1621   HGBUR NEGATIVE 01/26/2021 2156   BILIRUBINUR MODERATE (A) 01/26/2021 2156   KETONESUR 40 (A) 01/26/2021 2156   PROTEINUR TRACE (A) 01/26/2021 2156   UROBILINOGEN 1.0 03/27/2015 1534   NITRITE NEGATIVE 01/26/2021 2156   LEUKOCYTESUR NEGATIVE 01/26/2021 2156     Kathie Dike M.D. [facility] 02/10/2021, 9:26 PM  Available via Epic secure chat 7am-7pm After 7 pm, please refer to night coverage provider listed on amion.

## 2021-02-10 NOTE — TOC Progression Note (Signed)
Transition of Care [facility]) - Progression Note    Patient Details  Name: Connie Ruiz MRN: [id number] Date of Birth: 12-05-1960  [facility]) CM/SW Contact  Aleyssa Pike, Juliann Pulse, RN Phone Number: 02/10/2021, 4:09 PM  Clinical Narrative: PT ordered. Needed for auth once note placed.      Expected Discharge Plan: [place] Barriers to Discharge: Continued Medical Work up  Expected Discharge Plan and Services Expected Discharge Plan: [place] Choice: [place] arrangements for the past 2 months: [place]                                       Social Determinants of Health (SDOH) Interventions    Readmission Risk Interventions No flowsheet data found.

## 2021-02-10 NOTE — Progress Notes (Signed)
Nutrition Follow-up  DOCUMENTATION CODES:   Obesity unspecified  INTERVENTION:  - will order Boost Breeze BID, each supplement provides 250 kcal and 9 grams of protein. - will order Ensure Plus, each supplement provides 350 kcal and 13 grams of protein - will order Magic Cup BID with meals, each supplement provides 290 kcal and 9 grams of protein.   NUTRITION DIAGNOSIS:   Increased nutrient needs related to acute illness (C. diff infection) as evidenced by estimated needs. -ongoing  GOAL:   Patient will meet greater than or equal to 90% of their needs -unmet  MONITOR:   PO intake, Supplement acceptance, Labs, Weight trends  ASSESSMENT:   60 yo female with a PMH of [name], ESRD, now status post renal transplant in 2005, hypothyroidism, gout, GERD, hyperlipidemia, diabetes mellitus type 2, SLE, DVT on warfarin presented from [facility] with abdominal pain and diarrhea for 5 days prior to admission. Patient reported roughly 7 episodes in a day, watery, no hematochezia or melena. Also reported generalized weakness, fatigue, no fevers or chills. C. difficile positive. CT abdomen pelvis showed enteritis.  NGT was placed on 8/30 and removed at 1500 on 9/2. Diet advanced to CLD on 8/28 at 1215 and then to Soft on 9/1. She has been eating 25-50% at most meals since 9/1. No oral nutrition supplements in place at this time.  Patient laying in bed with no family or visitors present. Lunch tray is in front of patient and is untouched. Patient has some difficulty with expressing her thoughts. She does best with yes/no type questions.   She does not enjoy getting mashed potatoes as often as she has been. She continues to experience abdominal pain and it is worsened by PO intakes. She ate only a few bites of breakfast this AM and is not interested in eating the lunch that is in front of her.   She requested a brownie. Ordered this along with sliced peaches and vanilla ice cream for  her.   Weight has been stable throughout admission. Mild pitting edema to BUE and moderate pitting edema to BLE documented in the edema section of flow sheet.   Patient is from SNF and plans to return to SNF at the time of d/c.     Labs reviewed; CBGs: 120 and 175 mg/dl. Medications reviewed; 20 mg oral pepcid BID, 20 mg oral lasix/day, sliding scale novolog, 150 mcg oral synthroid/day, PRN imodium, 1 tablet multivitamin with minerals/day, 5 mg deltasone/day.   Diet Order:   Diet Order             DIET SOFT Room service appropriate? Yes; Fluid consistency: Thin  Diet effective now                   EDUCATION NEEDS:   Not appropriate for education at this time  Skin:  Skin Assessment: Reviewed RN Assessment (Cracking, ecchymosis)  Last BM:  9/6 (type 5 x1)  Height:   Ht Readings from Last 1 Encounters:  02/01/21 5\' 4"  (1.626 m)    Weight:   Wt Readings from Last 1 Encounters:  02/10/21 97.3 kg    Estimated Nutritional Needs:  Kcal:  2000-2200 Protein:  105-120 grams Fluid:  >2 L     Jarome Matin, MS, RD, LDN, CNSC Inpatient Clinical Dietitian RD pager # available in [facility]  After hours/weekend pager # available in [facility]

## 2021-02-10 NOTE — Progress Notes (Signed)
ANTICOAGULATION CONSULT NOTE - follow up  Pharmacy Consult for warfarin Indication: H/O DVT/PE  Patient Measurements: Height: 5\' 4"  (162.6 cm) Weight: 97.3 kg (214 lb 8.1 oz) IBW/kg (Calculated) : 54.7 Heparin Dosing Weight: 76 kg  Medications:  Warfarin PTA dosing of 6.5 mg daily  Assessment: 60 y/o F [name] on warfarin PTA for a h/o DVT/PE. Pharmacy consulted to dose heparin until patient consistently tolerating PO. Patient did receive vitamin K 2 mg IV on 8/27.  Warfarin resumed 8/23, no Warfarin 8/24, received 3mg  8/25 > held. Order to resume Warfarin 8/30 > discontinued  8/31 Flex sig at 3pm, heparin stopped 11am.  Heparin and Warfarin resumed after procedure  Today, 02/10/2021 INR therapeutic now x 2 and continues to rise fairly quickly now over last 3 days after inpatient warfarin doses of 6, 6, 6, 8, 8 and 6mg  CBC: Hgb stable, Plts stable SCr 0.70 stable No bleeding or infusion issues noted per nursing   Goal of Therapy:  INR 2-3 Monitor platelets by anticoagulation protocol: Yes   Plan:  Decrease warfarin to 2mg  today Daily CBC, heparin level, PT/INR Monitor closely for s/sx of bleeding   Adrian Saran, PharmD, BCPS Secure Chat if ?s 02/10/2021 8:29 AM

## 2021-02-11 DIAGNOSIS — K529 Noninfective gastroenteritis and colitis, unspecified: Secondary | ICD-10-CM | POA: Diagnosis not present

## 2021-02-11 LAB — BASIC METABOLIC PANEL
Anion gap: 6 (ref 5–15)
BUN: 21 mg/dL — ABNORMAL HIGH (ref 6–20)
CO2: 30 mmol/L (ref 22–32)
Calcium: 9.1 mg/dL (ref 8.9–10.3)
Chloride: 106 mmol/L (ref 98–111)
Creatinine, Ser: 0.79 mg/dL (ref 0.44–1.00)
GFR, Estimated: >60 mL/min (ref >60)
Glucose, Bld: 123 mg/dL — ABNORMAL HIGH (ref 70–99)
Potassium: 4.6 mmol/L (ref 3.5–5.1)
Sodium: 142 mmol/L (ref 135–145)

## 2021-02-11 LAB — CBC
HCT: 34.3 % — ABNORMAL LOW (ref 36.0–46.0)
Hemoglobin: 10.6 g/dL — ABNORMAL LOW (ref 12.0–15.0)
MCH: 27.2 pg (ref 26.0–34.0)
MCHC: 30.9 g/dL (ref 30.0–36.0)
MCV: 87.9 fL (ref 80.0–100.0)
Platelets: UNDETERMINED 10*3/uL (ref 150–400)
RBC: 3.9 MIL/uL (ref 3.87–5.11)
RDW: 16.6 % — ABNORMAL HIGH (ref 11.5–15.5)
WBC: 13.8 10*3/uL — ABNORMAL HIGH (ref 4.0–10.5)
nRBC: 0 % (ref 0.0–0.2)

## 2021-02-11 LAB — GLUCOSE, CAPILLARY
Glucose-Capillary: 263 mg/dL — ABNORMAL HIGH (ref 70–99)
Glucose-Capillary: 221 mg/dL — ABNORMAL HIGH (ref 70–99)
Glucose-Capillary: 175 mg/dL — ABNORMAL HIGH (ref 70–99)
Glucose-Capillary: 245 mg/dL — ABNORMAL HIGH (ref 70–99)

## 2021-02-11 LAB — SARS CORONAVIRUS 2 (TAT 6-24 HRS): SARS Coronavirus 2: NEGATIVE

## 2021-02-11 LAB — PROTIME-INR
INR: 3.6 — ABNORMAL HIGH (ref 0.8–1.2)
Prothrombin Time: 35.5 seconds — ABNORMAL HIGH (ref 11.4–15.2)

## 2021-02-11 NOTE — Progress Notes (Signed)
[facility]                                                                              Patient Demographics  Connie Ruiz, is a 60 y.o. female, DOB - 1961-05-04, JJH:[id number]  Admit date - 01/26/2021   Admitting Physician Donnamarie Poag [place] DO  Outpatient Primary MD for the patient is [name], [name], MD  Outpatient specialists:   LOS - 15  days   Medically stable for discharge - awaiting bed availability and insurance approval.  Chief Complaint  Patient presents with   Abdominal Pain       Brief summary   Patient is 60 year old female, [name], ESRD, now status post renal transplant in 2005, hypothyroidism, gout, GERD, hyperlipidemia, diabetes mellitus type 2, SLE, DVT on warfarin presented from [facility] with abdominal pain and diarrhea for 5 days prior to admission.  Patient reported roughly 7 episodes in a day, watery, no hematochezia or melena.  Also reported generalized weakness, fatigue, no fevers or chills. C. difficile positive.  CT abdomen pelvis showed enteritis. Symptoms improving daily but not completely resolved.   Assessment & Plan    Enteritis/diarrhea, abdominal pain, POA, improving -Presented with profuse diarrhea, generalized weakness, abdominal pain - C. difficile PCR Ag + but toxin negative.  Initially high suspicion for C. difficile colitis, so she was started on treatment. -No significant improvement with oral vancomycin. Changed to Dificid on 8/26 - Repeat CT abdomen on 8/25 did not show any bowel obstruction, perforation or toxic megacolon -GI, ID following.  -Patient was initially treated with Dificid for possible C. difficile, although with flex sig results not indicating any pseudomembranes, Dificid was discontinued -Per GI, would consider mycophenolate as possible cause of her GI symptoms -Patient is followed at [facility] by nephrology.  I discussed with Dr. Leonides Schanz on-call for nephrology regarding possibly  holding further mycophenolate -Recommendations from the nephrology team including hold further mycophenolate until she can follow-up in the office in the next few weeks -Review of previous GI records indicate that she did have improvement with Imodium in the past with diarrhea.  She been started on Imodium prn with improvement -Appears to be tolerating diet -added bentyl for cramping abdominal pain -abdominal pain does appear to have a chronic component since she was on oral morphine prior to admission   Active Problems: History of ESRD status post renal transplant, 2005 -Continue tacrolimus, pharmacy following.  Tacrolimus level in therapeutic range. -Patient is on prednisone maintenance at 5 mg daily -Mycophenolate has been put on hold due to concerns for adverse GI effects.  This will be readdressed by nephrology at [facility] on follow-up  Essential hypertension, sinus tachycardia -Likely due to dehydration from fluid loss with diarrhea -She is on Cardizem and Coreg -Heart rate appears to be stabilizing  History of DVT/PE/CVA -INR therapeutic on coumadin  GERD -Continue Pepcid  Hypothyroidism -Continue home dose of Synthroid -TSH 0.5   Diabetes mellitus, type II, IDDM -Hemoglobin A1c 8.2 -CBGs controlled  Hyperlipidemia -Continue atorvastatin  Depression/anxiety -Continue sertraline  History of [name] -Declines any blood products if needed  Obesity Estimated body mass  index is 36.82 kg/m as calculated from the following:   Height as of this encounter: 5\' 4"  (1.626 m).   Weight as of this encounter: 97.3 kg.  Code Status: Full CODE STATUS DVT Prophylaxis:    warfarin   Level of Care: Level of care: Progressive Family Communication: Updated patient's daughter over the phone  Disposition Plan:     Status is: Inpatient  Remains inpatient appropriate because:Inpatient level of care appropriate due to severity of illness  Dispo: The patient is from:  SNF              Anticipated d/c is to: SNF              Patient currently is not medically stable to d/c.     Difficult to place patient No   Time Spent in minutes 25 minutes  Procedures:  None  Consultants:   Gastroenterology Infectious disease  Antimicrobials:   Anti-infectives (From admission, onward)    Start     Dose/Rate Route Frequency Ordered Stop   01/30/21 2200  fidaxomicin (DIFICID) tablet 200 mg  Status:  Discontinued        200 mg Oral 2 times daily 01/30/21 1832 02/05/21 0906   01/30/21 2000  vancomycin (VANCOCIN) 500 mg in sodium chloride irrigation 0.9 % 100 mL ENEMA  Status:  Discontinued        500 mg Rectal Every 6 hours 01/30/21 1838 02/02/21 1152   01/27/21 2200  cefTRIAXone (ROCEPHIN) 2 g in sodium chloride 0.9 % 100 mL IVPB  Status:  Discontinued        2 g 200 mL/hr over 30 Minutes Intravenous Every 24 hours 01/27/21 0956 01/27/21 1136   01/27/21 1200  vancomycin (VANCOCIN) capsule 125 mg  Status:  Discontinued        125 mg Oral 4 times daily 01/27/21 1135 01/30/21 1832   01/27/21 1000  metroNIDAZOLE (FLAGYL) IVPB 500 mg  Status:  Discontinued        500 mg 100 mL/hr over 60 Minutes Intravenous Every 12 hours 01/27/21 0901 01/27/21 1135   01/27/21 0115  cefTRIAXone (ROCEPHIN) 2 g in sodium chloride 0.9 % 100 mL IVPB        2 g 200 mL/hr over 30 Minutes Intravenous  Once 01/27/21 0103 01/27/21 0411   01/27/21 0115  metroNIDAZOLE (FLAGYL) IVPB 500 mg  Status:  Discontinued        500 mg 100 mL/hr over 60 Minutes Intravenous Every 8 hours 01/27/21 0103 01/27/21 0901          Medications  Scheduled Meds:  carvedilol  6.25 mg Oral BID WC   dicyclomine  10 mg Oral TID AC   diltiazem  360 mg Oral Daily   donepezil  10 mg Oral QHS   famotidine  20 mg Oral BID   feeding supplement  1 Container Oral BID BM   feeding supplement  237 mL Oral Q24H   fiber  1 packet Oral BID   furosemide  20 mg Oral Daily   insulin aspart  0-5 Units Subcutaneous  QHS   insulin aspart  0-9 Units Subcutaneous TID WC   levothyroxine  150 mcg Oral Q0600   mouth rinse  15 mL Mouth Rinse BID   mirtazapine  30 mg Oral QHS   multivitamin with minerals  1 tablet Oral Daily   predniSONE  5 mg Oral Q breakfast   tacrolimus  2.5 mg Oral Q12H   Warfarin - Pharmacist Dosing  Inpatient   Does not apply q1600   Continuous Infusions:  promethazine (PHENERGAN) injection (IM or IVPB)     PRN Meds:.acetaminophen **OR** acetaminophen, diltiazem, fentaNYL (SUBLIMAZE) injection, hydrALAZINE, loperamide, morphine, promethazine (PHENERGAN) injection (IM or IVPB)      Subjective:   Tuyen Njoku is not having any vomiting. No bowel movements today per staff.   Objective:   Vitals:   02/10/21 1123 02/10/21 2045 02/11/21 0437 02/11/21 0852  BP: 140/67 135/75 (!) 150/87 112/70  Pulse: 76 78 99 98  Resp: 18 18 20    Temp: 98.9 F (37.2 C) 100.2 F (37.9 C) 99.3 F (37.4 C)   TempSrc:  Oral Oral   SpO2: 95% 99% 92%   Weight:  97.3 kg    Height:        Intake/Output Summary (Last 24 hours) at 02/11/2021 0856 Last data filed at 02/11/2021 0434 Gross per 24 hour  Intake 240 ml  Output 1000 ml  Net -760 ml      Wt Readings from Last 3 Encounters:  02/10/21 97.3 kg  08/21/20 88.9 kg  09/11/19 78.9 kg    Physical Exam General exam: Alert, awake, oriented x 3 Respiratory system: Clear to auscultation. Respiratory effort normal. Cardiovascular system:RRR. No murmurs, rubs, gallops. Gastrointestinal system: Abdomen is nondistended, soft and diffusely tender. No organomegaly or masses felt. Normal bowel sounds heard. Central nervous system: Alert and oriented. No focal neurological deficits. Extremities: 1+ edema bilaterally Skin: No rashes, lesions or ulcers Psychiatry: Judgement and insight appear normal. Mood & affect appropriate.    Data Reviewed:  I have personally reviewed following labs and imaging studies  Micro Results No results found for this  or any previous visit (from the past 240 hour(s)).   Radiology Reports CT ABDOMEN PELVIS WO CONTRAST  Result Date: 01/29/2021 CLINICAL DATA:  Bowel obstruction suspected. Abdominal pain, acute, nonlocalized. End-stage renal disease with previous renal transplant. EXAM: CT ABDOMEN AND PELVIS WITHOUT CONTRAST TECHNIQUE: Multidetector CT imaging of the abdomen and pelvis was performed following the standard protocol without IV contrast. COMPARISON:  01/26/2021 FINDINGS: Lower chest: Elevation of the right hemidiaphragm with mild atelectasis at the right lung base, similar to the previous study. Extensive coronary artery calcification. Hepatobiliary: Previous cholecystectomy.  No focal liver lesion. Pancreas: Normal Spleen: Normal Adrenals/Urinary Tract: Adrenal glands are normal. Markedly atrophic native kidneys without significant finding. Small cysts as seen previously. Transplant kidney in the right iliac fossa without abnormal finding by CT. Bladder appears normal. Stomach/Bowel: Stomach and small intestine are normal. Diverticulosis of the colon but no evidence of diverticulitis. No bowel obstruction or focal bowel lesion. Vascular/Lymphatic: Aortic atherosclerosis. No aneurysm. IVC is normal. No retroperitoneal adenopathy. Reproductive: No pelvic mass. Other: No free fluid or air. Musculoskeletal: No significant skeletal finding. IMPRESSION: No bowel obstruction or acute bowel pathology. Diverticulosis without evidence of diverticulitis. Elevation of the right hemidiaphragm with atelectasis at the right lung base. Atrophic native kidneys. Grossly normal appearing transplant kidney in the right iliac fossa. Aortic atherosclerosis. Widespread vascular calcification consistent with chronic renal failure. Electronically Signed   By: Nelson Chimes M.D.   On: 01/29/2021 16:10   CT ABDOMEN PELVIS WO CONTRAST  Result Date: 01/26/2021 CLINICAL DATA:  Concern for bowel obstruction. EXAM: CT ABDOMEN AND PELVIS  WITHOUT CONTRAST TECHNIQUE: Multidetector CT imaging of the abdomen and pelvis was performed following the standard protocol without IV contrast. COMPARISON:  CT abdomen pelvis dated 08/21/2020. FINDINGS: Evaluation of this exam is limited in the absence of intravenous  contrast. Lower chest: Minimal bibasilar atelectasis. The visualized lung bases are otherwise clear. There is advanced 3 vessel coronary vascular calcification. No intra-abdominal free air or free fluid. Hepatobiliary: The liver is unremarkable. No intrahepatic biliary dilatation. Cholecystectomy. No retained calcified stone noted in the central CBD. Pancreas: Unremarkable. No pancreatic ductal dilatation or surrounding inflammatory changes. Spleen: Normal in size without focal abnormality. Adrenals/Urinary Tract: The adrenal glands are unremarkable. Atrophic native kidneys. Bilateral renal parenchyma nodularity similar to prior CT may represent residual renal parenchyma. A mass is less likely but not excluded. The visualized ureters appear unremarkable. The urinary bladder is collapsed. There is a right lower quadrant renal transplant. There is no hydronephrosis or nephrolithiasis of the transplant kidney. No peritransplant fluid collection. Stomach/Bowel: There is diffuse colonic diverticulosis without active inflammatory changes. There is mild thickened appearance of the jejunal folds with engorgement of the associated mesentery. Clinical correlation is recommended to evaluate for possibility of enteritis. There is no bowel obstruction. The appendix is normal. Vascular/Lymphatic: Moderate aortoiliac atherosclerotic disease. The IVC is unremarkable. No portal venous gas. There is no adenopathy. Reproductive: The uterus is grossly unremarkable. No adnexal masses. Other: Mild diffuse subcutaneous stranding.  No fluid collection. Musculoskeletal: Osteopenia with degenerative changes of the spine. Multiple old left rib fractures. No acute osseous  pathology. IMPRESSION: 1. Mild thickened appearance of the jejunal folds with engorgement of the associated mesentery. Clinical correlation is recommended to evaluate for possibility of enteritis. No bowel obstruction. Normal appendix. 2. Colonic diverticulosis. 3. Atrophic native kidneys with a right lower quadrant renal transplant. No hydronephrosis or nephrolithiasis. 4. Aortic Atherosclerosis (ICD10-I70.0). Electronically Signed   By: Anner Crete M.D.   On: 01/26/2021 20:23   DG Chest 2 View  Result Date: 01/26/2021 CLINICAL DATA:  Lower abdominal pain since Friday, tachycardia EXAM: CHEST - 2 VIEW COMPARISON:  08/28/2020 FINDINGS: Frontal and lateral views of the chest demonstrate an unremarkable cardiac silhouette. No acute airspace disease, effusion, or pneumothorax. No acute bony abnormalities. IMPRESSION: 1. No acute intrathoracic process. Electronically Signed   By: Randa Ngo M.D.   On: 01/26/2021 22:17   DG Abd 2 Views  Result Date: 01/29/2021 CLINICAL DATA:  Abdominal pain EXAM: ABDOMEN - 2 VIEW COMPARISON:  None. FINDINGS: There is mildly dilated of the cecum. Nondistended small bowel. No acute osseous abnormality. There are soft tissue calcifications consistent with subcutaneous calcification seen on prior CT. IMPRESSION: Mild dilation of the cecum. If there is concern for obstruction recommend CT. Electronically Signed   By: Maurine Simmering M.D.   On: 01/29/2021 09:25   DG Abd Portable 1V  Result Date: 02/03/2021 CLINICAL DATA:  Nasogastric tube placement. EXAM: PORTABLE ABDOMEN - 1 VIEW COMPARISON:  X-ray abdomen 01/29/2021, CT abdomen pelvis 01/29/2021 FINDINGS: Enteric tube with tip overlying the expected region of the pyloric region/first portion of the duodenum. The bowel gas pattern is normal. Status post cholecystectomy. No radio-opaque calculi or other significant radiographic abnormality are seen. Aortic atherosclerosis. IMPRESSION: 1. Enteric tube with tip overlying the  expected region of the pyloric region/first portion of the duodenum. 2. Aortic Atherosclerosis (ICD10-I70.0). Electronically Signed   By: Iven Finn M.D.   On: 02/03/2021 16:22   ECHOCARDIOGRAM COMPLETE  Result Date: 02/03/2021    ECHOCARDIOGRAM REPORT   Patient Name:   ROSELL KHOURI Date of Exam: 02/03/2021 Medical Rec #:  [id number]    Height:       64.0 in Accession #:    [id number]   Weight:  213.0 lb Date of Birth:  06/25/1960    BSA:          2.009 m Patient Age:    60 years     BP:           163/66 mmHg Patient Gender: F            HR:           89 bpm. Exam Location:  Inpatient Procedure: 2D Echo, Cardiac Doppler and Color Doppler Indications:    R01.1 Murmur  History:        Patient has prior history of Echocardiogram examinations, most                 recent 08/14/2018. CHF, Arrythmias:Tachycardia; Risk                 Factors:Hypertension, Diabetes and Dyslipidemia.  Sonographer:    MH Referring Phys: [email or web address]  1. Left ventricular ejection fraction, by estimation, is 65 to 70%. The left ventricle has normal function. The left ventricle has no regional wall motion abnormalities. There is mild concentric left ventricular hypertrophy. Left ventricular diastolic parameters are consistent with Grade I diastolic dysfunction (impaired relaxation).  2. Right ventricular systolic function is normal. The right ventricular size is normal.  3. The mitral valve is normal in structure. Mild mitral valve regurgitation. No evidence of mitral stenosis.  4. The aortic valve is normal in structure. Aortic valve regurgitation is not visualized. Mild aortic valve sclerosis is present, with no evidence of aortic valve stenosis.  5. The inferior vena cava is normal in size with <50% respiratory variability, suggesting right atrial pressure of 8 mmHg. FINDINGS  Left Ventricle: Left ventricular ejection fraction, by estimation, is 65 to 70%. The left ventricle has normal function. The left  ventricle has no regional wall motion abnormalities. The left ventricular internal cavity size was normal in size. There is  mild concentric left ventricular hypertrophy. Left ventricular diastolic parameters are consistent with Grade I diastolic dysfunction (impaired relaxation). Indeterminate filling pressures. Right Ventricle: The right ventricular size is normal. No increase in right ventricular wall thickness. Right ventricular systolic function is normal. Left Atrium: Left atrial size was normal in size. Right Atrium: Right atrial size was normal in size. Pericardium: There is no evidence of pericardial effusion. Mitral Valve: The mitral valve is normal in structure. Mild to moderate mitral annular calcification. Mild mitral valve regurgitation. No evidence of mitral valve stenosis. Tricuspid Valve: The tricuspid valve is normal in structure. Tricuspid valve regurgitation is not demonstrated. No evidence of tricuspid stenosis. Aortic Valve: The aortic valve is normal in structure. Aortic valve regurgitation is not visualized. Mild aortic valve sclerosis is present, with no evidence of aortic valve stenosis. Aortic valve mean gradient measures 4.0 mmHg. Aortic valve peak gradient measures 7.4 mmHg. Aortic valve area, by VTI measures 3.13 cm. Pulmonic Valve: The pulmonic valve was normal in structure. Pulmonic valve regurgitation is not visualized. No evidence of pulmonic stenosis. Aorta: The aortic root is normal in size and structure. Venous: The inferior vena cava is normal in size with less than 50% respiratory variability, suggesting right atrial pressure of 8 mmHg. IAS/Shunts: No atrial level shunt detected by color flow Doppler.  LEFT VENTRICLE PLAX 2D LVIDd:         4.00 cm     Diastology LVIDs:         2.70 cm     LV e' medial:  4.57 cm/s LV PW:         1.30 cm     LV E/e' medial:  14.7 LV IVS:        1.70 cm     LV e' lateral:   5.00 cm/s LVOT diam:     2.20 cm     LV E/e' lateral: 13.5 LV SV:          68 LV SV Index:   34 LVOT Area:     3.80 cm  LV Volumes (MOD) LV vol d, MOD A4C: 26.9 ml LV vol s, MOD A4C: 12.0 ml LV SV MOD A4C:     26.9 ml RIGHT VENTRICLE             IVC RV S prime:     10.80 cm/s  IVC diam: 1.70 cm TAPSE (M-mode): 1.6 cm LEFT ATRIUM           Index       RIGHT ATRIUM          Index LA diam:      2.00 cm 1.00 cm/m  RA Area:     7.00 cm LA Vol (A2C): 36.3 ml 18.07 ml/m RA Volume:   12.40 ml 6.17 ml/m LA Vol (A4C): 14.0 ml 6.97 ml/m  AORTIC VALVE AV Area (Vmax):    3.21 cm AV Area (Vmean):   2.97 cm AV Area (VTI):     3.13 cm AV Vmax:           136.00 cm/s AV Vmean:          93.700 cm/s AV VTI:            0.216 m AV Peak Grad:      7.4 mmHg AV Mean Grad:      4.0 mmHg LVOT Vmax:         115.00 cm/s LVOT Vmean:        73.300 cm/s LVOT VTI:          0.178 m LVOT/AV VTI ratio: 0.82  AORTA Ao Root diam: 3.10 cm Ao Asc diam:  2.90 cm MITRAL VALVE MV Area (PHT): 3.67 cm     SHUNTS MV E velocity: 67.28 cm/s   Systemic VTI:  0.18 m MV A velocity: 158.00 cm/s  Systemic Diam: 2.20 cm MV E/A ratio:  0.43 Mihai Croitoru MD Electronically signed by Sanda Klein MD Signature Date/Time: 02/03/2021/4:54:42 PM    Final     Lab Data:  CBC: Recent Labs  Lab 02/07/21 0813 02/08/21 0210 02/09/21 0427 02/10/21 0414 02/11/21 0453  WBC 12.7* 14.4* 12.8* 11.2* 13.8*  HGB 11.4* 11.0* 9.8* 10.5* 10.6*  HCT 36.3 35.9* 31.7* 33.9* 34.3*  MCV 86.4 87.8 88.1 87.8 87.9  PLT 259 253 240 210 PLATELET CLUMPS NOTED ON SMEAR, UNABLE TO ESTIMATE    Basic Metabolic Panel: Recent Labs  Lab 02/05/21 0127 02/06/21 0500 02/07/21 0813 02/08/21 0210 02/09/21 0427 02/10/21 0414 02/11/21 0453  NA 137 137 143 138 142 144 142  K 3.2* 4.2 3.6 4.9 4.5 4.3 4.6  CL 102 104 107 103 107 107 106  CO2 26 29 32 29 31 32 30  GLUCOSE 242* 315* 167* 156* 122* 120* 123*  BUN 5* 15 13 18  21* 20 21*  CREATININE 0.53 0.53 0.44 0.62 0.86 0.70 0.79  CALCIUM 8.3* 9.2 9.4 9.1 9.4 9.2 9.1  MG 1.6* 1.7 1.3* 2.3  --    --   --   PHOS 2.5  --  3.4 3.8  --   --   --     GFR: Estimated Creatinine Clearance: 84.6 mL/min (by C-G formula based on SCr of 0.79 mg/dL). Liver Function Tests: Recent Labs  Lab 02/07/21 0813 02/08/21 0210  ALBUMIN 2.8* 2.9*    No results for input(s): LIPASE, AMYLASE in the last 168 hours.  No results for input(s): AMMONIA in the last 168 hours. Coagulation Profile: Recent Labs  Lab 02/06/21 0500 02/07/21 0813 02/08/21 0210 02/09/21 0427 02/10/21 0414  INR 1.3* 1.4* 1.6* 2.3* 2.7*    Cardiac Enzymes: No results for input(s): CKTOTAL, CKMB, CKMBINDEX, TROPONINI in the last 168 hours. BNP (last 3 results) No results for input(s): PROBNP in the last 8760 hours. HbA1C: No results for input(s): HGBA1C in the last 72 hours.  CBG: Recent Labs  Lab 02/09/21 2039 02/10/21 0738 02/10/21 1120 02/10/21 1608 02/10/21 2049  GLUCAP 179* 120* 175* 263* 181*    Lipid Profile: No results for input(s): CHOL, HDL, LDLCALC, TRIG, CHOLHDL, LDLDIRECT in the last 72 hours. Thyroid Function Tests: No results for input(s): TSH, T4TOTAL, FREET4, T3FREE, THYROIDAB in the last 72 hours.  Anemia Panel: No results for input(s): VITAMINB12, FOLATE, FERRITIN, TIBC, IRON, RETICCTPCT in the last 72 hours. Urine analysis:    Component Value Date/Time   COLORURINE YELLOW 01/26/2021 2156   APPEARANCEUR CLEAR 01/26/2021 2156   LABSPEC >1.030 (H) 01/26/2021 2156   PHURINE 6.0 01/26/2021 2156   GLUCOSEU NEGATIVE 01/26/2021 2156   GLUCOSEU NEGATIVE 10/22/2013 1621   HGBUR NEGATIVE 01/26/2021 2156   BILIRUBINUR MODERATE (A) 01/26/2021 2156   KETONESUR 40 (A) 01/26/2021 2156   PROTEINUR TRACE (A) 01/26/2021 2156   UROBILINOGEN 1.0 03/27/2015 1534   NITRITE NEGATIVE 01/26/2021 2156   LEUKOCYTESUR NEGATIVE 01/26/2021 2156    Little Ishikawa DO [facility] 02/11/2021, 8:56 AM  Available via Epic secure chat 7am-7pm After 7 pm, please refer to night coverage provider listed  on amion.

## 2021-02-11 NOTE — TOC Progression Note (Signed)
Transition of Care [facility]) - Progression Note    Patient Details  Name: Connie Ruiz MRN: [id number] Date of Birth: Nov 08, 1960  [facility]) CM/SW Contact  Kelleen Stolze, Juliann Pulse, RN Phone Number: 02/11/2021, 3:49 PM  Clinical Narrative:  Initiated insurance auth await Burnis Medin [phone] for [facility].     Expected Discharge Plan: Skilled Nursing Facility Barriers to Discharge: Insurance Authorization  Expected Discharge Plan and Services Expected Discharge Plan: [place] Choice: [facility] arrangements for the past 2 months: [place]                                       Social Determinants of Health (SDOH) Interventions    Readmission Risk Interventions No flowsheet data found.

## 2021-02-11 NOTE — Progress Notes (Signed)
ANTICOAGULATION CONSULT NOTE - follow up  Pharmacy Consult for warfarin Indication: H/O DVT/PE  Patient Measurements: Height: 5\' 4"  (162.6 cm) Weight: 97.3 kg (214 lb 8.1 oz) IBW/kg (Calculated) : 54.7 Heparin Dosing Weight: 76 kg  Medications:  Warfarin PTA dosing of 6.5 mg daily  Assessment: 60 y/o F [name] on warfarin PTA for a h/o DVT/PE. Pharmacy consulted to dose heparin until patient consistently tolerating PO. Patient did receive vitamin K 2 mg IV on 8/27.  Warfarin resumed 8/23, no Warfarin 8/24, received 3mg  8/25 > held. Order to resume Warfarin 8/30 > discontinued  8/31 Flex sig at 3pm, heparin stopped 11am.  Heparin and Warfarin resumed after procedure  Today, 02/11/2021 INR now SUPRAtherapeutic and continuing to rise fairly quickly now over last 4 days after inpatient warfarin doses of 6, 6, 6, 8, 8. 6, and 2mg  CBC: Hgb stable, Plts stable SCr 0.70 stable No bleeding or infusion issues noted per nursing  No consistent eating of meals as per charting  Goal of Therapy:  INR 2-3 Monitor platelets by anticoagulation protocol: Yes   Plan:  Hold warfarin today Daily CBC, heparin level, PT/INR Monitor closely for s/sx of bleeding   Adrian Saran, PharmD, BCPS Secure Chat if ?s 02/11/2021 9:36 AM

## 2021-02-11 NOTE — Evaluation (Addendum)
Physical Therapy RE-Evaluation Patient Details Name: Connie Ruiz MRN: [id number] DOB: [birth year] Today's Date: 02/11/2021   History of Present Illness  60 yo female admitted from [facility] where she has been for about 1.5 years. Admit this time for abdominal pain, weakness, diarhea, with enteritis and c.diff.  Hx of renal transplant 2009, lupus, CVA 2011, PE, gout, hypothyroiodism, DM, DVT, SLE  Clinical Impression  Pt's nurse reported pt is communicating more and feeling a little better. Pt presents with great weakness due to extended hospital stay and medical complexity. Unclear of how far this is from baseline or pt's true ability, however requiring a lot of assistance for rolling and general care. Miquel Dunn reports they have been hoyering her OOB, however pt reports she has not been out of bed much. Feel pt has potential to progress her strength and ability to help with bed mobility for daily care assistance, pt soul also improve strength to be able to tolerate OOB activities and possibly transportation and WC propulsion for long term goal.  Will continue to follow in acute care to see if pt has some progression and improvement while in acute care as well.     Follow Up Recommendations SNF    Equipment Recommendations  None recommended by PT    Recommendations for Other Services       Precautions / Restrictions Restrictions Weight Bearing Restrictions: No      Mobility  Bed Mobility Overal bed mobility: Needs Assistance Bed Mobility: Rolling Rolling: Max assist         General bed mobility comments: rolled R and Le in bed using handrails and cues for pt to assist with UEs and using LEs. Used bed pad to also assist with roll with MAx assist. Wanted to perform supine to side to sit edge of bed, however pt declined during this session due to her abdominal pain.    Transfers                 General transfer comment: deferred; would need mechanical  lift  Ambulation/Gait                Stairs            Wheelchair Mobility    Modified Rankin (Stroke Patients Only)       Balance                                             Pertinent Vitals/Pain Pain Assessment: Faces Faces Pain Scale: Hurts little more Pain Location: pt groaning during some movements and pointing to stomach stating pain and nauseated Pain Descriptors / Indicators: Aching;Moaning Pain Intervention(s): Monitored during session;Repositioned;Limited activity within patient's tolerance    Home Living Family/patient expects to be discharged to:: Skilled nursing facility                 Additional Comments: [place]    Prior Function Level of Independence: Needs assistance   Gait / Transfers Assistance Needed: pt poor historian, during session very few words but did communicate when given time and word choices. May have some expressive aphasia from CVA at baseline(?) Natalia Leatherwood, they reported that chart stated she was workignw ith rehab 01/2020 and progressed to Supervision with bed mobility and ambulation in hallway about 100 feet with RW and MinA at that time. REcently pt has been hoyer lift out  of bed, and idependent with feeding after set up.  ADL's / Homemaking Assistance Needed: has assist for all ADLs        Hand Dominance        Extremity/Trunk Assessment   Upper Extremity Assessment Upper Extremity Assessment:  (grossly weak 3/5, able to lift up against gravity but not repeating many times without fatigue.)    Lower Extremity Assessment Lower Extremity Assessment: Generalized weakness (grossly 3-/5 for BLEs. was able to perform DF , knee extension,and knee flexion WNL supine and with active muscle contractions in all muscles AAROM.)    Cervical / Trunk Assessment Cervical / Trunk Assessment:  (trunk with great atrophy noted with rolling)  Communication   Communication: Expressive difficulties   Cognition Arousal/Alertness: Awake/alert Behavior During Therapy: WFL for tasks assessed/performed Overall Cognitive Status: No family/caregiver present to determine baseline cognitive functioning                                 General Comments: oriented to year and name. followed commands and responds appropriately.      General Comments      Exercises     Assessment/Plan    PT Assessment Patient needs continued PT services  PT Problem List Decreased strength;Decreased activity tolerance;Decreased mobility       PT Treatment Interventions Functional mobility training;Therapeutic activities;Therapeutic exercise    PT Goals (Current goals can be found in the Care Plan section)  Acute Rehab PT Goals Patient Stated Goal: to feel better and do a little mroe for myself PT Goal Formulation: With patient Time For Goal Achievement: 02/25/21 Potential to Achieve Goals: Fair    Frequency Min 2X/week   Barriers to discharge        Co-evaluation               AM-PAC PT "6 Clicks" Mobility  Outcome Measure Help needed turning from your back to your side while in a flat bed without using bedrails?: Total Help needed moving from lying on your back to sitting on the side of a flat bed without using bedrails?: Total Help needed moving to and from a bed to a chair (including a wheelchair)?: Total Help needed standing up from a chair using your arms (e.g., wheelchair or bedside chair)?: Total Help needed to walk in hospital room?: Total Help needed climbing 3-5 steps with a railing? : Total 6 Click Score: 6    End of Session   Activity Tolerance: Patient limited by pain Patient left: in bed;with call bell/phone within reach;with bed alarm set   PT Visit Diagnosis: Muscle weakness (generalized) (M62.81)    Time: 1320-1350 PT Time Calculation (min) (ACUTE ONLY): 30 min   Charges:   PT Evaluation $PT Re-evaluation: 1 Re-eval PT Treatments $Therapeutic  Activity: 8-22 mins        Juliona Vales, PT, MPT Acute Rehabilitation Services Office: [phone] Pager: [phone] 02/11/2021    Clide Dales 02/11/2021, 2:38 PM

## 2021-02-12 DIAGNOSIS — K529 Noninfective gastroenteritis and colitis, unspecified: Secondary | ICD-10-CM | POA: Diagnosis not present

## 2021-02-12 LAB — GLUCOSE, CAPILLARY
Glucose-Capillary: 145 mg/dL — ABNORMAL HIGH (ref 70–99)
Glucose-Capillary: 142 mg/dL — ABNORMAL HIGH (ref 70–99)
Glucose-Capillary: 161 mg/dL — ABNORMAL HIGH (ref 70–99)
Glucose-Capillary: 244 mg/dL — ABNORMAL HIGH (ref 70–99)

## 2021-02-12 LAB — PROTIME-INR
INR: 3.6 — ABNORMAL HIGH (ref 0.8–1.2)
Prothrombin Time: 35.5 seconds — ABNORMAL HIGH (ref 11.4–15.2)

## 2021-02-12 LAB — MYCOPHENOLIC ACID (CELLCEPT)
MPA Glucuronide: <5 ug/mL — ABNORMAL LOW (ref 15–125)
MPA: <0.5 ug/mL — ABNORMAL LOW (ref 1.0–3.5)

## 2021-02-12 NOTE — TOC Progression Note (Addendum)
Transition of Care [facility]) - Progression Note    Patient Details  Name: Connie Ruiz MRN: [id number] Date of Birth: Mar 13, 1961  [facility] CM/SW Contact  Myrakle Wingler, Juliann Pulse, RN Phone Number: 02/12/2021, 10:43 AM  Clinical Narrative:  Additional info requested from [place] for [facility]. Informed dtr [name] of current status, & reminded that patient came from [facility]. 3p-Received request for peer to peer from [place] is not in agreement to do peer to peer-patient to return back to [facility]  aware of of denial for st snf-& agrees to d/c back to [facility] aware of return back to [address] Covid neg. Await MD to d/c.    Expected Discharge Plan: Skilled Nursing Facility Barriers to Discharge: Insurance Authorization  Expected Discharge Plan and Services Expected Discharge Plan: [place] Choice: [place] arrangements for the past 2 months: [place]                                       Social Determinants of Health (SDOH) Interventions    Readmission Risk Interventions No flowsheet data found.

## 2021-02-12 NOTE — Progress Notes (Signed)
ANTICOAGULATION CONSULT NOTE - follow up  Pharmacy Consult for warfarin Indication: H/O DVT/PE  Patient Measurements: Height: 5\' 4"  (162.6 cm) Weight: 96.9 kg (213 lb 10 oz) IBW/kg (Calculated) : 54.7 Heparin Dosing Weight: 76 kg  Medications:  Warfarin PTA dosing of 6.5 mg daily  Assessment: 60 y/o F [name] on warfarin PTA for a h/o DVT/PE. Pharmacy consulted to dose heparin until patient consistently tolerating PO. Patient did receive vitamin K 2 mg IV on 8/27.  Warfarin resumed 8/23, no Warfarin 8/24, received 3mg  8/25 > held. Order to resume Warfarin 8/30 > discontinued  8/31 Flex sig at 3pm, heparin stopped 11am.  Heparin and Warfarin resumed after procedure  Today, 02/12/2021 INR now SUPRAtherapeutic and continuing to rise fairly quickly now over last 4 days after inpatient warfarin doses of 6, 6, 6, 8, 8. 6, and 2mg  ( LD on 9/6)  CBC: Hgb stable, Plts stable SCr 0.79 stable ( 9/7)  No bleeding or infusion issues noted per nursing  No consistent eating of meals as per charting  Goal of Therapy:  INR 2-3 Monitor platelets by anticoagulation protocol: Yes   Plan:  Hold warfarin today Daily CBC, heparin level, PT/INR Monitor closely for s/sx of bleeding    Royetta Asal, PharmD, BCPS 02/12/2021 8:20 AM

## 2021-02-12 NOTE — Progress Notes (Signed)
[facility]                                                                              Patient Demographics  Connie Ruiz, is a 60 y.o. female, DOB - 1961-03-04, EGB:[id number]  Admit date - 01/26/2021   Admitting Physician Donnamarie Poag [place] DO  Outpatient Primary MD for the patient is [name], [name], MD  Outpatient specialists:   LOS - 16  days   Medically stable for discharge - awaiting bed availability and insurance approval.  Chief Complaint  Patient presents with   Abdominal Pain       Brief summary   Patient is 60 year old female, [name], ESRD, now status post renal transplant in 2005, hypothyroidism, gout, GERD, hyperlipidemia, diabetes mellitus type 2, SLE, DVT on warfarin presented from [facility] with abdominal pain and diarrhea for 5 days prior to admission.  Patient reported roughly 7 episodes in a day, watery, no hematochezia or melena.  Also reported generalized weakness, fatigue, no fevers or chills. C. difficile positive.  CT abdomen pelvis showed enteritis. Symptoms improving daily but not completely resolved at this time.   Assessment & Plan    Enteritis/diarrhea, abdominal pain, POA, improving -Presented with profuse diarrhea, generalized weakness, abdominal pain - C. difficile PCR Ag + but toxin negative.  Initially high suspicion for C. difficile colitis, so she was started on treatment. -No significant improvement with oral vancomycin. Changed to Dificid on 8/26 - Repeat CT abdomen on 8/25 did not show any bowel obstruction, perforation or toxic megacolon -GI, ID following.  -Patient was initially treated with Dificid for possible C. difficile, although with flex sig results not indicating any pseudomembranes, Dificid was discontinued -Per GI, would consider mycophenolate as possible cause of her GI symptoms -Patient is followed at [facility] by nephrology.  I discussed with Dr. Leonides Schanz on-call for nephrology  regarding possibly holding further mycophenolate -Recommendations from the nephrology team including hold further mycophenolate until she can follow-up in the office in the next few weeks -Review of previous GI records indicate that she did have improvement with Imodium in the past with diarrhea.  She been started on Imodium prn with improvement -Appears to be tolerating diet -added bentyl for cramping abdominal pain -abdominal pain does appear to have a chronic component since she was on oral morphine prior to admission   Active Problems: History of ESRD status post renal transplant, 2005 -Continue tacrolimus, pharmacy following.  Tacrolimus level in therapeutic range. -Patient is on prednisone maintenance at 5 mg daily -Mycophenolate has been put on hold due to concerns for adverse GI effects.  This will be readdressed by nephrology at [facility] on follow-up  Essential hypertension, sinus tachycardia -Likely due to dehydration from fluid loss with diarrhea -She is on Cardizem and Coreg -Heart rate appears to be stabilizing  History of DVT/PE/CVA -INR therapeutic on coumadin  GERD -Continue Pepcid  Hypothyroidism -Continue home dose of Synthroid -TSH 0.5   Diabetes mellitus, type II, IDDM -Hemoglobin A1c 8.2 -CBGs controlled  Hyperlipidemia -Continue atorvastatin  Depression/anxiety -Continue sertraline  History of [name] -Declines any blood products if needed  Obesity  Estimated body mass index is 36.67 kg/m as calculated from the following:   Height as of this encounter: 5\' 4"  (1.626 m).   Weight as of this encounter: 96.9 kg.  Code Status: Full CODE STATUS DVT Prophylaxis:    warfarin   Level of Care: Level of care: Progressive Family Communication: Updated patient's daughter over the phone  Disposition Plan:     Status is: Inpatient  Remains inpatient appropriate because:Inpatient level of care appropriate due to severity of illness  Dispo: The  patient is from: SNF              Anticipated d/c is to: SNF              Patient currently is not medically stable to d/c.     Difficult to place patient No   Time Spent in minutes 25 minutes  Procedures:  None  Consultants:   Gastroenterology Infectious disease  Antimicrobials:   Anti-infectives (From admission, onward)    Start     Dose/Rate Route Frequency Ordered Stop   01/30/21 2200  fidaxomicin (DIFICID) tablet 200 mg  Status:  Discontinued        200 mg Oral 2 times daily 01/30/21 1832 02/05/21 0906   01/30/21 2000  vancomycin (VANCOCIN) 500 mg in sodium chloride irrigation 0.9 % 100 mL ENEMA  Status:  Discontinued        500 mg Rectal Every 6 hours 01/30/21 1838 02/02/21 1152   01/27/21 2200  cefTRIAXone (ROCEPHIN) 2 g in sodium chloride 0.9 % 100 mL IVPB  Status:  Discontinued        2 g 200 mL/hr over 30 Minutes Intravenous Every 24 hours 01/27/21 0956 01/27/21 1136   01/27/21 1200  vancomycin (VANCOCIN) capsule 125 mg  Status:  Discontinued        125 mg Oral 4 times daily 01/27/21 1135 01/30/21 1832   01/27/21 1000  metroNIDAZOLE (FLAGYL) IVPB 500 mg  Status:  Discontinued        500 mg 100 mL/hr over 60 Minutes Intravenous Every 12 hours 01/27/21 0901 01/27/21 1135   01/27/21 0115  cefTRIAXone (ROCEPHIN) 2 g in sodium chloride 0.9 % 100 mL IVPB        2 g 200 mL/hr over 30 Minutes Intravenous  Once 01/27/21 0103 01/27/21 0411   01/27/21 0115  metroNIDAZOLE (FLAGYL) IVPB 500 mg  Status:  Discontinued        500 mg 100 mL/hr over 60 Minutes Intravenous Every 8 hours 01/27/21 0103 01/27/21 0901          Medications  Scheduled Meds:  carvedilol  6.25 mg Oral BID WC   dicyclomine  10 mg Oral TID AC   diltiazem  360 mg Oral Daily   donepezil  10 mg Oral QHS   famotidine  20 mg Oral BID   feeding supplement  1 Container Oral BID BM   feeding supplement  237 mL Oral Q24H   fiber  1 packet Oral BID   furosemide  20 mg Oral Daily   insulin aspart  0-5  Units Subcutaneous QHS   insulin aspart  0-9 Units Subcutaneous TID WC   levothyroxine  150 mcg Oral Q0600   mouth rinse  15 mL Mouth Rinse BID   mirtazapine  30 mg Oral QHS   multivitamin with minerals  1 tablet Oral Daily   predniSONE  5 mg Oral Q breakfast   tacrolimus  2.5 mg Oral Q12H   Warfarin -  Pharmacist Dosing Inpatient   Does not apply q1600   Continuous Infusions:  promethazine (PHENERGAN) injection (IM or IVPB)     PRN Meds:.acetaminophen **OR** acetaminophen, diltiazem, hydrALAZINE, loperamide, morphine, promethazine (PHENERGAN) injection (IM or IVPB)      Subjective:   Chaselyn Placke is not having any vomiting. No bowel movements today per staff.   Objective:   Vitals:   02/11/21 2300 02/12/21 0512 02/12/21 0550 02/12/21 0601  BP:  111/61    Pulse:  70  74  Resp:  18    Temp:  98 F (36.7 C)    TempSrc:      SpO2: 94% (!) 87%  96%  Weight:   96.9 kg   Height:        Intake/Output Summary (Last 24 hours) at 02/12/2021 0730 Last data filed at 02/12/2021 0550 Gross per 24 hour  Intake 780 ml  Output 200 ml  Net 580 ml      Wt Readings from Last 3 Encounters:  02/12/21 96.9 kg  08/21/20 88.9 kg  09/11/19 78.9 kg    Physical Exam General exam: Alert, awake, oriented x 3 Respiratory system: Clear to auscultation. Respiratory effort normal. Cardiovascular system:RRR. No murmurs, rubs, gallops. Gastrointestinal system: Abdomen is nondistended, soft and diffusely tender. No organomegaly or masses felt. Normal bowel sounds heard. Central nervous system: Alert and oriented. No focal neurological deficits. Extremities: 1+ edema bilaterally Skin: No rashes, lesions or ulcers Psychiatry: Judgement and insight appear normal. Mood & affect appropriate.    Data Reviewed:  I have personally reviewed following labs and imaging studies  Micro Results Recent Results (from the past 240 hour(s))  SARS CORONAVIRUS 2 (TAT 6-24 HRS) Nasopharyngeal Nasopharyngeal  Swab     Status: None   Collection Time: 02/11/21 11:20 AM   Specimen: Nasopharyngeal Swab  Result Value Ref Range Status   SARS Coronavirus 2 NEGATIVE NEGATIVE Final    Comment: (NOTE) SARS-CoV-2 target nucleic acids are NOT DETECTED.  The SARS-CoV-2 RNA is generally detectable in upper and lower respiratory specimens during the acute phase of infection. Negative results do not preclude SARS-CoV-2 infection, do not rule out co-infections with other pathogens, and should not be used as the sole basis for treatment or other patient management decisions. Negative results must be combined with clinical observations, patient history, and epidemiological information. The expected result is Negative.  Fact Sheet for Patients: SugarRoll.be  Fact Sheet for Healthcare Providers: https://www.woods-mathews.com/  This test is not yet approved or cleared by the [place] FDA and  has been authorized for detection and/or diagnosis of SARS-CoV-2 by FDA under an Emergency Use Authorization (EUA). This EUA will remain  in effect (meaning this test can be used) for the duration of the COVID-19 declaration under Se ction 564(b)(1) of the Act, 21 U.S.C. section 360bbb-3(b)(1), unless the authorization is terminated or revoked sooner.  Performed at [facility], [place] [address] [place], [place] [address]      Radiology Reports CT ABDOMEN PELVIS WO CONTRAST  Result Date: 01/29/2021 CLINICAL DATA:  Bowel obstruction suspected. Abdominal pain, acute, nonlocalized. End-stage renal disease with previous renal transplant. EXAM: CT ABDOMEN AND PELVIS WITHOUT CONTRAST TECHNIQUE: Multidetector CT imaging of the abdomen and pelvis was performed following the standard protocol without IV contrast. COMPARISON:  01/26/2021 FINDINGS: Lower chest: Elevation of the right hemidiaphragm with mild atelectasis at the right lung base, similar to the previous study.  Extensive coronary artery calcification. Hepatobiliary: Previous cholecystectomy.  No focal liver lesion.  Pancreas: Normal Spleen: Normal Adrenals/Urinary Tract: Adrenal glands are normal. Markedly atrophic native kidneys without significant finding. Small cysts as seen previously. Transplant kidney in the right iliac fossa without abnormal finding by CT. Bladder appears normal. Stomach/Bowel: Stomach and small intestine are normal. Diverticulosis of the colon but no evidence of diverticulitis. No bowel obstruction or focal bowel lesion. Vascular/Lymphatic: Aortic atherosclerosis. No aneurysm. IVC is normal. No retroperitoneal adenopathy. Reproductive: No pelvic mass. Other: No free fluid or air. Musculoskeletal: No significant skeletal finding. IMPRESSION: No bowel obstruction or acute bowel pathology. Diverticulosis without evidence of diverticulitis. Elevation of the right hemidiaphragm with atelectasis at the right lung base. Atrophic native kidneys. Grossly normal appearing transplant kidney in the right iliac fossa. Aortic atherosclerosis. Widespread vascular calcification consistent with chronic renal failure. Electronically Signed   By: Nelson Chimes M.D.   On: 01/29/2021 16:10   CT ABDOMEN PELVIS WO CONTRAST  Result Date: 01/26/2021 CLINICAL DATA:  Concern for bowel obstruction. EXAM: CT ABDOMEN AND PELVIS WITHOUT CONTRAST TECHNIQUE: Multidetector CT imaging of the abdomen and pelvis was performed following the standard protocol without IV contrast. COMPARISON:  CT abdomen pelvis dated 08/21/2020. FINDINGS: Evaluation of this exam is limited in the absence of intravenous contrast. Lower chest: Minimal bibasilar atelectasis. The visualized lung bases are otherwise clear. There is advanced 3 vessel coronary vascular calcification. No intra-abdominal free air or free fluid. Hepatobiliary: The liver is unremarkable. No intrahepatic biliary dilatation. Cholecystectomy. No retained calcified stone noted in the  central CBD. Pancreas: Unremarkable. No pancreatic ductal dilatation or surrounding inflammatory changes. Spleen: Normal in size without focal abnormality. Adrenals/Urinary Tract: The adrenal glands are unremarkable. Atrophic native kidneys. Bilateral renal parenchyma nodularity similar to prior CT may represent residual renal parenchyma. A mass is less likely but not excluded. The visualized ureters appear unremarkable. The urinary bladder is collapsed. There is a right lower quadrant renal transplant. There is no hydronephrosis or nephrolithiasis of the transplant kidney. No peritransplant fluid collection. Stomach/Bowel: There is diffuse colonic diverticulosis without active inflammatory changes. There is mild thickened appearance of the jejunal folds with engorgement of the associated mesentery. Clinical correlation is recommended to evaluate for possibility of enteritis. There is no bowel obstruction. The appendix is normal. Vascular/Lymphatic: Moderate aortoiliac atherosclerotic disease. The IVC is unremarkable. No portal venous gas. There is no adenopathy. Reproductive: The uterus is grossly unremarkable. No adnexal masses. Other: Mild diffuse subcutaneous stranding.  No fluid collection. Musculoskeletal: Osteopenia with degenerative changes of the spine. Multiple old left rib fractures. No acute osseous pathology. IMPRESSION: 1. Mild thickened appearance of the jejunal folds with engorgement of the associated mesentery. Clinical correlation is recommended to evaluate for possibility of enteritis. No bowel obstruction. Normal appendix. 2. Colonic diverticulosis. 3. Atrophic native kidneys with a right lower quadrant renal transplant. No hydronephrosis or nephrolithiasis. 4. Aortic Atherosclerosis (ICD10-I70.0). Electronically Signed   By: Anner Crete M.D.   On: 01/26/2021 20:23   DG Chest 2 View  Result Date: 01/26/2021 CLINICAL DATA:  Lower abdominal pain since Friday, tachycardia EXAM: CHEST - 2  VIEW COMPARISON:  08/28/2020 FINDINGS: Frontal and lateral views of the chest demonstrate an unremarkable cardiac silhouette. No acute airspace disease, effusion, or pneumothorax. No acute bony abnormalities. IMPRESSION: 1. No acute intrathoracic process. Electronically Signed   By: Randa Ngo M.D.   On: 01/26/2021 22:17   DG Abd 2 Views  Result Date: 01/29/2021 CLINICAL DATA:  Abdominal pain EXAM: ABDOMEN - 2 VIEW COMPARISON:  None. FINDINGS: There is mildly dilated  of the cecum. Nondistended small bowel. No acute osseous abnormality. There are soft tissue calcifications consistent with subcutaneous calcification seen on prior CT. IMPRESSION: Mild dilation of the cecum. If there is concern for obstruction recommend CT. Electronically Signed   By: Maurine Simmering M.D.   On: 01/29/2021 09:25   DG Abd Portable 1V  Result Date: 02/03/2021 CLINICAL DATA:  Nasogastric tube placement. EXAM: PORTABLE ABDOMEN - 1 VIEW COMPARISON:  X-ray abdomen 01/29/2021, CT abdomen pelvis 01/29/2021 FINDINGS: Enteric tube with tip overlying the expected region of the pyloric region/first portion of the duodenum. The bowel gas pattern is normal. Status post cholecystectomy. No radio-opaque calculi or other significant radiographic abnormality are seen. Aortic atherosclerosis. IMPRESSION: 1. Enteric tube with tip overlying the expected region of the pyloric region/first portion of the duodenum. 2. Aortic Atherosclerosis (ICD10-I70.0). Electronically Signed   By: Iven Finn M.D.   On: 02/03/2021 16:22   ECHOCARDIOGRAM COMPLETE  Result Date: 02/03/2021    ECHOCARDIOGRAM REPORT   Patient Name:   Connie Ruiz Date of Exam: 02/03/2021 Medical Rec #:  [id number]    Height:       64.0 in Accession #:    [id number]   Weight:       213.0 lb Date of Birth:  02-26-1961    BSA:          2.009 m Patient Age:    85 years     BP:           163/66 mmHg Patient Gender: F            HR:           89 bpm. Exam Location:  Inpatient Procedure: 2D  Echo, Cardiac Doppler and Color Doppler Indications:    R01.1 Murmur  History:        Patient has prior history of Echocardiogram examinations, most                 recent 08/14/2018. CHF, Arrythmias:Tachycardia; Risk                 Factors:Hypertension, Diabetes and Dyslipidemia.  Sonographer:    MH Referring Phys: [place]  1. Left ventricular ejection fraction, by estimation, is 65 to 70%. The left ventricle has normal function. The left ventricle has no regional wall motion abnormalities. There is mild concentric left ventricular hypertrophy. Left ventricular diastolic parameters are consistent with Grade I diastolic dysfunction (impaired relaxation).  2. Right ventricular systolic function is normal. The right ventricular size is normal.  3. The mitral valve is normal in structure. Mild mitral valve regurgitation. No evidence of mitral stenosis.  4. The aortic valve is normal in structure. Aortic valve regurgitation is not visualized. Mild aortic valve sclerosis is present, with no evidence of aortic valve stenosis.  5. The inferior vena cava is normal in size with <50% respiratory variability, suggesting right atrial pressure of 8 mmHg. FINDINGS  Left Ventricle: Left ventricular ejection fraction, by estimation, is 65 to 70%. The left ventricle has normal function. The left ventricle has no regional wall motion abnormalities. The left ventricular internal cavity size was normal in size. There is  mild concentric left ventricular hypertrophy. Left ventricular diastolic parameters are consistent with Grade I diastolic dysfunction (impaired relaxation). Indeterminate filling pressures. Right Ventricle: The right ventricular size is normal. No increase in right ventricular wall thickness. Right ventricular systolic function is normal. Left Atrium: Left atrial size was normal in size. Right  Atrium: Right atrial size was normal in size. Pericardium: There is no evidence of pericardial  effusion. Mitral Valve: The mitral valve is normal in structure. Mild to moderate mitral annular calcification. Mild mitral valve regurgitation. No evidence of mitral valve stenosis. Tricuspid Valve: The tricuspid valve is normal in structure. Tricuspid valve regurgitation is not demonstrated. No evidence of tricuspid stenosis. Aortic Valve: The aortic valve is normal in structure. Aortic valve regurgitation is not visualized. Mild aortic valve sclerosis is present, with no evidence of aortic valve stenosis. Aortic valve mean gradient measures 4.0 mmHg. Aortic valve peak gradient measures 7.4 mmHg. Aortic valve area, by VTI measures 3.13 cm. Pulmonic Valve: The pulmonic valve was normal in structure. Pulmonic valve regurgitation is not visualized. No evidence of pulmonic stenosis. Aorta: The aortic root is normal in size and structure. Venous: The inferior vena cava is normal in size with less than 50% respiratory variability, suggesting right atrial pressure of 8 mmHg. IAS/Shunts: No atrial level shunt detected by color flow Doppler.  LEFT VENTRICLE PLAX 2D LVIDd:         4.00 cm     Diastology LVIDs:         2.70 cm     LV e' medial:    4.57 cm/s LV PW:         1.30 cm     LV E/e' medial:  14.7 LV IVS:        1.70 cm     LV e' lateral:   5.00 cm/s LVOT diam:     2.20 cm     LV E/e' lateral: 13.5 LV SV:         68 LV SV Index:   34 LVOT Area:     3.80 cm  LV Volumes (MOD) LV vol d, MOD A4C: 26.9 ml LV vol s, MOD A4C: 12.0 ml LV SV MOD A4C:     26.9 ml RIGHT VENTRICLE             IVC RV S prime:     10.80 cm/s  IVC diam: 1.70 cm TAPSE (M-mode): 1.6 cm LEFT ATRIUM           Index       RIGHT ATRIUM          Index LA diam:      2.00 cm 1.00 cm/m  RA Area:     7.00 cm LA Vol (A2C): 36.3 ml 18.07 ml/m RA Volume:   12.40 ml 6.17 ml/m LA Vol (A4C): 14.0 ml 6.97 ml/m  AORTIC VALVE AV Area (Vmax):    3.21 cm AV Area (Vmean):   2.97 cm AV Area (VTI):     3.13 cm AV Vmax:           136.00 cm/s AV Vmean:           93.700 cm/s AV VTI:            0.216 m AV Peak Grad:      7.4 mmHg AV Mean Grad:      4.0 mmHg LVOT Vmax:         115.00 cm/s LVOT Vmean:        73.300 cm/s LVOT VTI:          0.178 m LVOT/AV VTI ratio: 0.82  AORTA Ao Root diam: 3.10 cm Ao Asc diam:  2.90 cm MITRAL VALVE MV Area (PHT): 3.67 cm     SHUNTS MV E velocity: 67.28 cm/s   Systemic VTI:  0.18 m MV A velocity: 158.00 cm/s  Systemic Diam: 2.20 cm MV E/A ratio:  0.43 Mihai Croitoru MD Electronically signed by Sanda Klein MD Signature Date/Time: 02/03/2021/4:54:42 PM    Final     Lab Data:  CBC: Recent Labs  Lab 02/07/21 0813 02/08/21 0210 02/09/21 0427 02/10/21 0414 02/11/21 0453  WBC 12.7* 14.4* 12.8* 11.2* 13.8*  HGB 11.4* 11.0* 9.8* 10.5* 10.6*  HCT 36.3 35.9* 31.7* 33.9* 34.3*  MCV 86.4 87.8 88.1 87.8 87.9  PLT 259 253 240 210 PLATELET CLUMPS NOTED ON SMEAR, UNABLE TO ESTIMATE    Basic Metabolic Panel: Recent Labs  Lab 02/06/21 0500 02/07/21 0813 02/08/21 0210 02/09/21 0427 02/10/21 0414 02/11/21 0453  NA 137 143 138 142 144 142  K 4.2 3.6 4.9 4.5 4.3 4.6  CL 104 107 103 107 107 106  CO2 29 32 29 31 32 30  GLUCOSE 315* 167* 156* 122* 120* 123*  BUN 15 13 18  21* 20 21*  CREATININE 0.53 0.44 0.62 0.86 0.70 0.79  CALCIUM 9.2 9.4 9.1 9.4 9.2 9.1  MG 1.7 1.3* 2.3  --   --   --   PHOS  --  3.4 3.8  --   --   --     GFR: Estimated Creatinine Clearance: 84.5 mL/min (by C-G formula based on SCr of 0.79 mg/dL). Liver Function Tests: Recent Labs  Lab 02/07/21 0813 02/08/21 0210  ALBUMIN 2.8* 2.9*    No results for input(s): LIPASE, AMYLASE in the last 168 hours.  No results for input(s): AMMONIA in the last 168 hours. Coagulation Profile: Recent Labs  Lab 02/08/21 0210 02/09/21 0427 02/10/21 0414 02/11/21 0810 02/12/21 0424  INR 1.6* 2.3* 2.7* 3.6* 3.6*    Cardiac Enzymes: No results for input(s): CKTOTAL, CKMB, CKMBINDEX, TROPONINI in the last 168 hours. BNP (last 3 results) No results for  input(s): PROBNP in the last 8760 hours. HbA1C: No results for input(s): HGBA1C in the last 72 hours.  CBG: Recent Labs  Lab 02/10/21 2049 02/11/21 1201 02/11/21 1647 02/11/21 2034 02/12/21 0719  GLUCAP 181* 221* 175* 245* 145*    Lipid Profile: No results for input(s): CHOL, HDL, LDLCALC, TRIG, CHOLHDL, LDLDIRECT in the last 72 hours. Thyroid Function Tests: No results for input(s): TSH, T4TOTAL, FREET4, T3FREE, THYROIDAB in the last 72 hours.  Anemia Panel: No results for input(s): VITAMINB12, FOLATE, FERRITIN, TIBC, IRON, RETICCTPCT in the last 72 hours. Urine analysis:    Component Value Date/Time   COLORURINE YELLOW 01/26/2021 2156   APPEARANCEUR CLEAR 01/26/2021 2156   LABSPEC >1.030 (H) 01/26/2021 2156   PHURINE 6.0 01/26/2021 2156   GLUCOSEU NEGATIVE 01/26/2021 2156   GLUCOSEU NEGATIVE 10/22/2013 1621   HGBUR NEGATIVE 01/26/2021 2156   BILIRUBINUR MODERATE (A) 01/26/2021 2156   KETONESUR 40 (A) 01/26/2021 2156   PROTEINUR TRACE (A) 01/26/2021 2156   UROBILINOGEN 1.0 03/27/2015 1534   NITRITE NEGATIVE 01/26/2021 2156   LEUKOCYTESUR NEGATIVE 01/26/2021 2156    Little Ishikawa DO [facility] 02/12/2021, 7:30 AM  Available via Epic secure chat 7am-7pm After 7 pm, please refer to night coverage provider listed on amion.

## 2021-02-12 NOTE — Discharge Summary (Signed)
Physician Discharge Summary  Connie Ruiz:[id number] DOB: December 29, 1960 DOA: 01/26/2021  PCP: [name], [name], MD  Admit date: 01/26/2021 Discharge date: 02/13/2021  Admitted From: Rehab Disposition: Same  Recommendations for Outpatient Follow-up:  Follow up with PCP in 1-2 weeks Please obtain BMP/CBC in one week  Discharge Condition: Stable CODE STATUS: Full Diet recommendation: Advance as tolerated to diabetic diet  Brief/Interim Summary: Patient is 60 year old female, [name], ESRD, now status post renal transplant in 2005, hypothyroidism, gout, GERD, hyperlipidemia, diabetes mellitus type 2, SLE, DVT on warfarin presented from [facility] with abdominal pain and diarrhea for 5 days prior to admission.  Patient reported roughly 7 episodes in a day, watery, no hematochezia or melena.  Also reported generalized weakness, fatigue, no fevers or chills. C. difficile positive.  CT abdomen pelvis showed enteritis. Symptoms improving daily, otherwise stable for discharge back to facility.     Assessment & Plan      Enteritis/diarrhea, abdominal pain, POA, improving -Presented with profuse diarrhea, generalized weakness, abdominal pain - C. difficile PCR Ag + but toxin negative.  Initially high suspicion for C. difficile colitis, so she was started on treatment. -No significant improvement with oral vancomycin. Changed to Dificid on 8/26 - Repeat CT abdomen on 8/25 did not show any bowel obstruction, perforation or toxic megacolon -GI, ID following.  -Patient was initially treated with Dificid for possible C. difficile, although with flex sig results not indicating any pseudomembranes, Dificid was discontinued -Per GI, would consider mycophenolate as possible cause of her GI symptoms -Patient is followed at [facility] by nephrology. Recommendations from the nephrology team including hold further mycophenolate until she can follow-up in the office in the next few  weeks -Review of previous GI records indicate that she did have improvement with Imodium in the past with diarrhea.  She been started on Imodium prn with improvement -Appears to be tolerating diet -Continue bentyl for cramping abdominal pain -abdominal pain does appear to have a chronic component -generally improving however     History of ESRD status post renal transplant, 2005 -Continue tacrolimus - level in therapeutic range. -Patient is on prednisone maintenance at 5 mg daily -Mycophenolate has been put on hold due to concerns for adverse GI effects.  This will be readdressed by nephrology at [facility] on follow-up   Essential hypertension, sinus tachycardia -Likely due to dehydration from fluid loss with diarrhea - Cardizem and Coreg   History of DVT/PE/CVA -INR therapeutic on coumadin   GERD -Continue Pepcid   Hypothyroidism -Continue lower dose of Synthroid -TSH 0.5    Diabetes mellitus, type II, IDDM, uncontrolled -Hemoglobin A1c 8.2 -Continue home diabetic diet and meds   Hyperlipidemia -Continue atorvastatin   Depression/anxiety -Continue sertraline   [name] -Declines any blood products  Discharge Instructions  Discharge Instructions     Diet - low sodium heart healthy   Complete by: As directed    Increase activity slowly   Complete by: As directed       Allergies as of 02/13/2021       Reactions   Oxycodone-acetaminophen Shortness Of Breath, Nausea Only   Propoxyphene Nausea Only, Shortness Of Breath   States takes tylenol at home   Propoxyphene N-acetaminophen Shortness Of Breath, Nausea Only   Sulfonamide Derivatives Shortness Of Breath, Nausea Only   Codeine Nausea Only   Hydrocodone-acetaminophen    Hydromorphone    Other Other (See Comments)   No blood, Jehovaeh Witness    Oxycodone  Sulfamethoxazole    Tape    Redness**PAPER TAPE OK**   Gabapentin Anxiety   twitching   Latex Rash   Metoprolol Rash   Morphine And Related  Rash   IV site on arm is red, patient reports this is improving.  NO shortness of breath reported.   Rosiglitazone Rash        Medication List     STOP taking these medications    atorvastatin 40 MG tablet Commonly known as: LIPITOR   gabapentin 300 MG capsule Commonly known as: NEURONTIN   losartan 100 MG tablet Commonly known as: COZAAR   QUEtiapine 25 MG tablet Commonly known as: SEROquel   sertraline 100 MG tablet Commonly known as: ZOLOFT   traMADol 50 MG tablet Commonly known as: ULTRAM       TAKE these medications    Accu-Chek Aviva Plus w/Device Kit 1 Device by Does not apply route daily.   acetaminophen 500 MG tablet Commonly known as: TYLENOL Take 1,000 mg by mouth 4 (four) times daily as needed for mild pain or moderate pain.   alendronate 70 MG tablet Commonly known as: FOSAMAX TAKE 1 TABLET BY MOUTH ONCE A WEEK ON AN EMPTY STOMACH ON SATURDAYS WITH A FULL GLASS OF WATER. What changed: See the new instructions.   calcitRIOL 0.25 MCG capsule Commonly known as: ROCALTROL TAKE 1 CAPSULE(0.25 MCG) BY MOUTH DAILY. NO REFILLS WITHOUT APPOINTMENT   carvedilol 6.25 MG tablet Commonly known as: COREG Take 1 tablet (6.25 mg total) by mouth 2 (two) times daily with a meal.   cholecalciferol 25 MCG (1000 UNIT) tablet Commonly known as: VITAMIN D3 Take 1,000 Units by mouth daily.   dicyclomine 10 MG capsule Commonly known as: BENTYL Take 1 capsule (10 mg total) by mouth 3 (three) times daily before meals.   diltiazem 360 MG 24 hr capsule Commonly known as: TIAZAC Take 1 capsule (360 mg total) by mouth daily.   donepezil 10 MG tablet Commonly known as: ARICEPT Take 10 mg by mouth at bedtime.   esomeprazole 20 MG capsule Commonly known as: NEXIUM TAKE 1 CAPSULE(20 MG) BY MOUTH DAILY What changed: See the new instructions.   eucerin cream Apply 1 application topically daily. To feet   famotidine 20 MG tablet Commonly known as: PEPCID Take  20 mg by mouth 2 (two) times daily.   feeding supplement Liqd Take 237 mLs by mouth 2 (two) times daily between meals. What changed:  when to take this reasons to take this   furosemide 20 MG tablet Commonly known as: LASIX Take 20 mg by mouth daily.   glucose blood test strip Commonly known as: ONE TOUCH ULTRA TEST Use to check blood sugar 1 time per day   HumaLOG 100 UNIT/ML injection Generic drug: insulin lispro Inject 0-12 Units into the skin 3 (three) times daily before meals. Per Sliding Scale. If blood sugar is: 0-150: 0 units 151-200: 2 units 201-250: 4 units 251-300: 6 units 301-350: 8 units 351-400: 10 units 401-450: 12 units   levothyroxine 150 MCG tablet Commonly known as: SYNTHROID Take 1 tablet (150 mcg total) by mouth daily at 6 (six) AM. Start taking on: February 14, 2021 What changed:  medication strength how much to take how to take this when to take this additional instructions   loperamide 2 MG capsule Commonly known as: IMODIUM Take 1 capsule (2 mg total) by mouth every 6 (six) hours as needed for diarrhea or loose stools.   magnesium oxide 400  MG tablet Commonly known as: MAG-OX Take 800 mg by mouth daily.   metFORMIN 500 MG 24 hr tablet Commonly known as: GLUCOPHAGE-XR TAKE 1 TABLET(500 MG) BY MOUTH DAILY WITH BREAKFAST What changed:  how much to take how to take this when to take this additional instructions   mirtazapine 30 MG tablet Commonly known as: REMERON Take 1 tablet (30 mg total) by mouth at bedtime.   morphine 15 MG tablet Commonly known as: MSIR Take 1 tablet (15 mg total) by mouth every 4 (four) hours as needed for up to 3 days for moderate pain. What changed:  how much to take when to take this reasons to take this   multivitamin Tabs tablet Take 1 tablet by mouth daily.   mycophenolate 250 MG capsule Commonly known as: CELLCEPT Take 250 mg by mouth 2 (two) times daily.   NONFORMULARY OR COMPOUNDED  ITEM [facility]:  Pain Cream - Ketamine 5%, Baclofen 2%, Gabapentin 5%, Lidocaine 5%, Menthol 1%, apply 1-2 grams to affected area 3-4 times a day for pain.   nystatin powder Commonly known as: MYCOSTATIN/NYSTOP Apply 1 g topically 4 (four) times daily as needed. For yeast under breast   ondansetron 4 MG disintegrating tablet Commonly known as: Zofran ODT 50m ODT q4 hours prn nausea/vomiting What changed:  how much to take how to take this when to take this reasons to take this additional instructions   OneTouch Delica Lancets 343PMisc Use to check blood sugar 1 time per day   Potassium Chloride ER 20 MEQ Tbcr Take 1 tablet by mouth daily. NEEDS POTASSIUM RE-CHECK.   predniSONE 5 MG tablet Commonly known as: DELTASONE Take 5 mg by mouth daily with breakfast.   psyllium 58.6 % packet Commonly known as: METAMUCIL Take 1 packet by mouth daily.   sucralfate 1 g tablet Commonly known as: Carafate Take 1 tablet (1 g total) by mouth 4 (four) times daily for 30 days.   tacrolimus 1 MG capsule Commonly known as: PROGRAF Take 2.5 mg by mouth every 12 (twelve) hours.   warfarin 5 MG tablet Commonly known as: COUMADIN TAKE 1/2 TO 1 TABLET BY MOUTH ON MONDAY THROUGH FRIDAY; TUESDAY, WEDNESDAY, THURSDAY, SATAND SUNDAY-2.5MG What changed: See the new instructions.        Contact information for after-discharge care     Destination     [facility] Preferred SNF .   Service: Skilled Nursing Contact information: [id number] [phone]                   Allergies  Allergen Reactions   Oxycodone-Acetaminophen Shortness Of Breath and Nausea Only   Propoxyphene Nausea Only and Shortness Of Breath    States takes tylenol at home   Propoxyphene N-Acetaminophen Shortness Of Breath and Nausea Only   Sulfonamide Derivatives Shortness Of Breath and Nausea Only   Codeine Nausea Only    Hydrocodone-Acetaminophen    Hydromorphone    Other Other (See Comments)    No blood, Jehovaeh Witness    Oxycodone    Sulfamethoxazole    Tape     Redness**PAPER TAPE OK**   Gabapentin Anxiety    twitching   Latex Rash   Metoprolol Rash   Morphine And Related Rash    IV site on arm is red, patient reports this is improving.  NO shortness of breath reported.   Rosiglitazone Rash    Procedures/Studies: CT ABDOMEN PELVIS WO CONTRAST  Result  Date: 01/29/2021 CLINICAL DATA:  Bowel obstruction suspected. Abdominal pain, acute, nonlocalized. End-stage renal disease with previous renal transplant. EXAM: CT ABDOMEN AND PELVIS WITHOUT CONTRAST TECHNIQUE: Multidetector CT imaging of the abdomen and pelvis was performed following the standard protocol without IV contrast. COMPARISON:  01/26/2021 FINDINGS: Lower chest: Elevation of the right hemidiaphragm with mild atelectasis at the right lung base, similar to the previous study. Extensive coronary artery calcification. Hepatobiliary: Previous cholecystectomy.  No focal liver lesion. Pancreas: Normal Spleen: Normal Adrenals/Urinary Tract: Adrenal glands are normal. Markedly atrophic native kidneys without significant finding. Small cysts as seen previously. Transplant kidney in the right iliac fossa without abnormal finding by CT. Bladder appears normal. Stomach/Bowel: Stomach and small intestine are normal. Diverticulosis of the colon but no evidence of diverticulitis. No bowel obstruction or focal bowel lesion. Vascular/Lymphatic: Aortic atherosclerosis. No aneurysm. IVC is normal. No retroperitoneal adenopathy. Reproductive: No pelvic mass. Other: No free fluid or air. Musculoskeletal: No significant skeletal finding. IMPRESSION: No bowel obstruction or acute bowel pathology. Diverticulosis without evidence of diverticulitis. Elevation of the right hemidiaphragm with atelectasis at the right lung base. Atrophic native kidneys. Grossly normal appearing  transplant kidney in the right iliac fossa. Aortic atherosclerosis. Widespread vascular calcification consistent with chronic renal failure. Electronically Signed   By: Nelson Chimes M.D.   On: 01/29/2021 16:10   CT ABDOMEN PELVIS WO CONTRAST  Result Date: 01/26/2021 CLINICAL DATA:  Concern for bowel obstruction. EXAM: CT ABDOMEN AND PELVIS WITHOUT CONTRAST TECHNIQUE: Multidetector CT imaging of the abdomen and pelvis was performed following the standard protocol without IV contrast. COMPARISON:  CT abdomen pelvis dated 08/21/2020. FINDINGS: Evaluation of this exam is limited in the absence of intravenous contrast. Lower chest: Minimal bibasilar atelectasis. The visualized lung bases are otherwise clear. There is advanced 3 vessel coronary vascular calcification. No intra-abdominal free air or free fluid. Hepatobiliary: The liver is unremarkable. No intrahepatic biliary dilatation. Cholecystectomy. No retained calcified stone noted in the central CBD. Pancreas: Unremarkable. No pancreatic ductal dilatation or surrounding inflammatory changes. Spleen: Normal in size without focal abnormality. Adrenals/Urinary Tract: The adrenal glands are unremarkable. Atrophic native kidneys. Bilateral renal parenchyma nodularity similar to prior CT may represent residual renal parenchyma. A mass is less likely but not excluded. The visualized ureters appear unremarkable. The urinary bladder is collapsed. There is a right lower quadrant renal transplant. There is no hydronephrosis or nephrolithiasis of the transplant kidney. No peritransplant fluid collection. Stomach/Bowel: There is diffuse colonic diverticulosis without active inflammatory changes. There is mild thickened appearance of the jejunal folds with engorgement of the associated mesentery. Clinical correlation is recommended to evaluate for possibility of enteritis. There is no bowel obstruction. The appendix is normal. Vascular/Lymphatic: Moderate aortoiliac  atherosclerotic disease. The IVC is unremarkable. No portal venous gas. There is no adenopathy. Reproductive: The uterus is grossly unremarkable. No adnexal masses. Other: Mild diffuse subcutaneous stranding.  No fluid collection. Musculoskeletal: Osteopenia with degenerative changes of the spine. Multiple old left rib fractures. No acute osseous pathology. IMPRESSION: 1. Mild thickened appearance of the jejunal folds with engorgement of the associated mesentery. Clinical correlation is recommended to evaluate for possibility of enteritis. No bowel obstruction. Normal appendix. 2. Colonic diverticulosis. 3. Atrophic native kidneys with a right lower quadrant renal transplant. No hydronephrosis or nephrolithiasis. 4. Aortic Atherosclerosis (ICD10-I70.0). Electronically Signed   By: Anner Crete M.D.   On: 01/26/2021 20:23   DG Chest 2 View  Result Date: 01/26/2021 CLINICAL DATA:  Lower abdominal pain since Friday, tachycardia  EXAM: CHEST - 2 VIEW COMPARISON:  08/28/2020 FINDINGS: Frontal and lateral views of the chest demonstrate an unremarkable cardiac silhouette. No acute airspace disease, effusion, or pneumothorax. No acute bony abnormalities. IMPRESSION: 1. No acute intrathoracic process. Electronically Signed   By: Randa Ngo M.D.   On: 01/26/2021 22:17   DG Abd 2 Views  Result Date: 01/29/2021 CLINICAL DATA:  Abdominal pain EXAM: ABDOMEN - 2 VIEW COMPARISON:  None. FINDINGS: There is mildly dilated of the cecum. Nondistended small bowel. No acute osseous abnormality. There are soft tissue calcifications consistent with subcutaneous calcification seen on prior CT. IMPRESSION: Mild dilation of the cecum. If there is concern for obstruction recommend CT. Electronically Signed   By: Maurine Simmering M.D.   On: 01/29/2021 09:25   DG Abd Portable 1V  Result Date: 02/03/2021 CLINICAL DATA:  Nasogastric tube placement. EXAM: PORTABLE ABDOMEN - 1 VIEW COMPARISON:  X-ray abdomen 01/29/2021, CT abdomen  pelvis 01/29/2021 FINDINGS: Enteric tube with tip overlying the expected region of the pyloric region/first portion of the duodenum. The bowel gas pattern is normal. Status post cholecystectomy. No radio-opaque calculi or other significant radiographic abnormality are seen. Aortic atherosclerosis. IMPRESSION: 1. Enteric tube with tip overlying the expected region of the pyloric region/first portion of the duodenum. 2. Aortic Atherosclerosis (ICD10-I70.0). Electronically Signed   By: Iven Finn M.D.   On: 02/03/2021 16:22   ECHOCARDIOGRAM COMPLETE  Result Date: 02/03/2021    ECHOCARDIOGRAM REPORT   Patient Name:   Connie Ruiz Date of Exam: 02/03/2021 Medical Rec #:  [id number]    Height:       64.0 in Accession #:    [id number]   Weight:       213.0 lb Date of Birth:  12-07-1960    BSA:          2.009 m Patient Age:    33 years     BP:           163/66 mmHg Patient Gender: F            HR:           89 bpm. Exam Location:  Inpatient Procedure: 2D Echo, Cardiac Doppler and Color Doppler Indications:    R01.1 Murmur  History:        Patient has prior history of Echocardiogram examinations, most                 recent 08/14/2018. CHF, Arrythmias:Tachycardia; Risk                 Factors:Hypertension, Diabetes and Dyslipidemia.  Sonographer:    MH Referring Phys: [place]  1. Left ventricular ejection fraction, by estimation, is 65 to 70%. The left ventricle has normal function. The left ventricle has no regional wall motion abnormalities. There is mild concentric left ventricular hypertrophy. Left ventricular diastolic parameters are consistent with Grade I diastolic dysfunction (impaired relaxation).  2. Right ventricular systolic function is normal. The right ventricular size is normal.  3. The mitral valve is normal in structure. Mild mitral valve regurgitation. No evidence of mitral stenosis.  4. The aortic valve is normal in structure. Aortic valve regurgitation is not visualized.  Mild aortic valve sclerosis is present, with no evidence of aortic valve stenosis.  5. The inferior vena cava is normal in size with <50% respiratory variability, suggesting right atrial pressure of 8 mmHg. FINDINGS  Left Ventricle: Left ventricular ejection fraction, by estimation, is 65 to  70%. The left ventricle has normal function. The left ventricle has no regional wall motion abnormalities. The left ventricular internal cavity size was normal in size. There is  mild concentric left ventricular hypertrophy. Left ventricular diastolic parameters are consistent with Grade I diastolic dysfunction (impaired relaxation). Indeterminate filling pressures. Right Ventricle: The right ventricular size is normal. No increase in right ventricular wall thickness. Right ventricular systolic function is normal. Left Atrium: Left atrial size was normal in size. Right Atrium: Right atrial size was normal in size. Pericardium: There is no evidence of pericardial effusion. Mitral Valve: The mitral valve is normal in structure. Mild to moderate mitral annular calcification. Mild mitral valve regurgitation. No evidence of mitral valve stenosis. Tricuspid Valve: The tricuspid valve is normal in structure. Tricuspid valve regurgitation is not demonstrated. No evidence of tricuspid stenosis. Aortic Valve: The aortic valve is normal in structure. Aortic valve regurgitation is not visualized. Mild aortic valve sclerosis is present, with no evidence of aortic valve stenosis. Aortic valve mean gradient measures 4.0 mmHg. Aortic valve peak gradient measures 7.4 mmHg. Aortic valve area, by VTI measures 3.13 cm. Pulmonic Valve: The pulmonic valve was normal in structure. Pulmonic valve regurgitation is not visualized. No evidence of pulmonic stenosis. Aorta: The aortic root is normal in size and structure. Venous: The inferior vena cava is normal in size with less than 50% respiratory variability, suggesting right atrial pressure of 8 mmHg.  IAS/Shunts: No atrial level shunt detected by color flow Doppler.  LEFT VENTRICLE PLAX 2D LVIDd:         4.00 cm     Diastology LVIDs:         2.70 cm     LV e' medial:    4.57 cm/s LV PW:         1.30 cm     LV E/e' medial:  14.7 LV IVS:        1.70 cm     LV e' lateral:   5.00 cm/s LVOT diam:     2.20 cm     LV E/e' lateral: 13.5 LV SV:         68 LV SV Index:   34 LVOT Area:     3.80 cm  LV Volumes (MOD) LV vol d, MOD A4C: 26.9 ml LV vol s, MOD A4C: 12.0 ml LV SV MOD A4C:     26.9 ml RIGHT VENTRICLE             IVC RV S prime:     10.80 cm/s  IVC diam: 1.70 cm TAPSE (M-mode): 1.6 cm LEFT ATRIUM           Index       RIGHT ATRIUM          Index LA diam:      2.00 cm 1.00 cm/m  RA Area:     7.00 cm LA Vol (A2C): 36.3 ml 18.07 ml/m RA Volume:   12.40 ml 6.17 ml/m LA Vol (A4C): 14.0 ml 6.97 ml/m  AORTIC VALVE AV Area (Vmax):    3.21 cm AV Area (Vmean):   2.97 cm AV Area (VTI):     3.13 cm AV Vmax:           136.00 cm/s AV Vmean:          93.700 cm/s AV VTI:            0.216 m AV Peak Grad:      7.4 mmHg AV Mean Grad:  4.0 mmHg LVOT Vmax:         115.00 cm/s LVOT Vmean:        73.300 cm/s LVOT VTI:          0.178 m LVOT/AV VTI ratio: 0.82  AORTA Ao Root diam: 3.10 cm Ao Asc diam:  2.90 cm MITRAL VALVE MV Area (PHT): 3.67 cm     SHUNTS MV E velocity: 67.28 cm/s   Systemic VTI:  0.18 m MV A velocity: 158.00 cm/s  Systemic Diam: 2.20 cm MV E/A ratio:  0.43 Mihai Croitoru MD Electronically signed by Sanda Klein MD Signature Date/Time: 02/03/2021/4:54:42 PM    Final      Subjective: No acute issues or events overnight abdominal pain improving denies nausea vomiting diarrhea constipation headache fevers or chills   Discharge Exam: Vitals:   02/13/21 0800 02/13/21 1131  BP:  (!) 150/84  Pulse:  79  Resp: 15 16  Temp:  98.5 F (36.9 C)  SpO2:  96%   Vitals:   02/13/21 0443 02/13/21 0455 02/13/21 0800 02/13/21 1131  BP: (!) 112/52   (!) 150/84  Pulse: 83   79  Resp: _0 Temp: 98.7 F  (37.1 C)   98.5 F (36.9 C)  TempSrc: Oral   Oral  SpO2: 94%   96%  Weight:  94.8 kg    Height:        General exam: Alert, awake, oriented x 3 Respiratory system: Clear to auscultation. Respiratory effort normal. Cardiovascular system:RRR. No murmurs, rubs, gallops. Gastrointestinal system: Abdomen is nondistended, soft and diffusely tender. No organomegaly or masses felt. Normal bowel sounds heard. Central nervous system: Alert and oriented. No focal neurological deficits. Extremities: 1+ edema bilaterally Skin: No rashes, lesions or ulcers Psychiatry: Judgement and insight appear normal. Mood & affect appropriate.    The results of significant diagnostics from this hospitalization (including imaging, microbiology, ancillary and laboratory) are listed below for reference.     Microbiology: Recent Results (from the past 240 hour(s))  SARS CORONAVIRUS 2 (TAT 6-24 HRS) Nasopharyngeal Nasopharyngeal Swab     Status: None   Collection Time: 02/11/21 11:20 AM   Specimen: Nasopharyngeal Swab  Result Value Ref Range Status   SARS Coronavirus 2 NEGATIVE NEGATIVE Final    Comment: (NOTE) SARS-CoV-2 target nucleic acids are NOT DETECTED.  The SARS-CoV-2 RNA is generally detectable in upper and lower respiratory specimens during the acute phase of infection. Negative results do not preclude SARS-CoV-2 infection, do not rule out co-infections with other pathogens, and should not be used as the sole basis for treatment or other patient management decisions. Negative results must be combined with clinical observations, patient history, and epidemiological information. The expected result is Negative.  Fact Sheet for Patients: SugarRoll.be  Fact Sheet for Healthcare Providers: https://www.woods-mathews.com/  This test is not yet approved or cleared by the [place] FDA and  has been authorized for detection and/or diagnosis of SARS-CoV-2  by FDA under an Emergency Use Authorization (EUA). This EUA will remain  in effect (meaning this test can be used) for the duration of the COVID-19 declaration under Se ction 564(b)(1) of the Act, 21 U.S.C. section 360bbb-3(b)(1), unless the authorization is terminated or revoked sooner.  Performed at [facility], [place] [address], [place], [place] [address]      Labs: BNP (last 3 results) Recent Labs    08/21/20 1420  BNP 56.9   Basic Metabolic Panel: Recent Labs  Lab 02/07/21 0813  02/08/21 0210 02/09/21 0427 02/10/21 0414 02/11/21 0453  NA 143 138 142 144 142  K 3.6 4.9 4.5 4.3 4.6  CL 107 103 107 107 106  CO2 32 29 31 32 30  GLUCOSE 167* 156* 122* 120* 123*  BUN 13 18 21* 20 21*  CREATININE 0.44 0.62 0.86 0.70 0.79  CALCIUM 9.4 9.1 9.4 9.2 9.1  MG 1.3* 2.3  --   --   --   PHOS 3.4 3.8  --   --   --    Liver Function Tests: Recent Labs  Lab 02/07/21 0813 02/08/21 0210  ALBUMIN 2.8* 2.9*   No results for input(s): LIPASE, AMYLASE in the last 168 hours. No results for input(s): AMMONIA in the last 168 hours. CBC: Recent Labs  Lab 02/07/21 0813 02/08/21 0210 02/09/21 0427 02/10/21 0414 02/11/21 0453  WBC 12.7* 14.4* 12.8* 11.2* 13.8*  HGB 11.4* 11.0* 9.8* 10.5* 10.6*  HCT 36.3 35.9* 31.7* 33.9* 34.3*  MCV 86.4 87.8 88.1 87.8 87.9  PLT 259 253 240 210 PLATELET CLUMPS NOTED ON SMEAR, UNABLE TO ESTIMATE   Cardiac Enzymes: No results for input(s): CKTOTAL, CKMB, CKMBINDEX, TROPONINI in the last 168 hours. BNP: Invalid input(s): POCBNP CBG: Recent Labs  Lab 02/12/21 1137 02/12/21 1605 02/12/21 2135 02/13/21 0725 02/13/21 1127  GLUCAP 142* 161* 244* 202* 210*   D-Dimer No results for input(s): DDIMER in the last 72 hours. Hgb A1c No results for input(s): HGBA1C in the last 72 hours. Lipid Profile No results for input(s): CHOL, HDL, LDLCALC, TRIG, CHOLHDL, LDLDIRECT in the last 72 hours. Thyroid function studies No results for input(s):  TSH, T4TOTAL, T3FREE, THYROIDAB in the last 72 hours.  Invalid input(s): FREET3 Anemia work up No results for input(s): VITAMINB12, FOLATE, FERRITIN, TIBC, IRON, RETICCTPCT in the last 72 hours. Urinalysis    Component Value Date/Time   COLORURINE YELLOW 01/26/2021 2156   APPEARANCEUR CLEAR 01/26/2021 2156   LABSPEC >1.030 (H) 01/26/2021 2156   PHURINE 6.0 01/26/2021 2156   GLUCOSEU NEGATIVE 01/26/2021 2156   GLUCOSEU NEGATIVE 10/22/2013 1621   HGBUR NEGATIVE 01/26/2021 2156   BILIRUBINUR MODERATE (A) 01/26/2021 2156   KETONESUR 40 (A) 01/26/2021 2156   PROTEINUR TRACE (A) 01/26/2021 2156   UROBILINOGEN 1.0 03/27/2015 1534   NITRITE NEGATIVE 01/26/2021 2156   LEUKOCYTESUR NEGATIVE 01/26/2021 2156   Sepsis Labs Invalid input(s): PROCALCITONIN,  WBC,  LACTICIDVEN Microbiology Recent Results (from the past 240 hour(s))  SARS CORONAVIRUS 2 (TAT 6-24 HRS) Nasopharyngeal Nasopharyngeal Swab     Status: None   Collection Time: 02/11/21 11:20 AM   Specimen: Nasopharyngeal Swab  Result Value Ref Range Status   SARS Coronavirus 2 NEGATIVE NEGATIVE Final    Comment: (NOTE) SARS-CoV-2 target nucleic acids are NOT DETECTED.  The SARS-CoV-2 RNA is generally detectable in upper and lower respiratory specimens during the acute phase of infection. Negative results do not preclude SARS-CoV-2 infection, do not rule out co-infections with other pathogens, and should not be used as the sole basis for treatment or other patient management decisions. Negative results must be combined with clinical observations, patient history, and epidemiological information. The expected result is Negative.  Fact Sheet for Patients: SugarRoll.be  Fact Sheet for Healthcare Providers: https://www.woods-mathews.com/  This test is not yet approved or cleared by the [place] FDA and  has been authorized for detection and/or diagnosis of SARS-CoV-2 by FDA under an  Emergency Use Authorization (EUA). This EUA will remain  in effect (meaning this test can be  used) for the duration of the COVID-19 declaration under Se ction 564(b)(1) of the Act, 21 U.S.C. section 360bbb-3(b)(1), unless the authorization is terminated or revoked sooner.  Performed at [facility], [place] [address] [place],  [address]      Time coordinating discharge: Over 30 minutes  SIGNED:   Little Ishikawa, DO [facility] 02/13/2021, 12:12 PM Pager   If 7PM-7AM, please contact night-coverage [email or web address]

## 2021-02-13 DIAGNOSIS — K529 Noninfective gastroenteritis and colitis, unspecified: Secondary | ICD-10-CM | POA: Diagnosis not present

## 2021-02-13 LAB — GLUCOSE, CAPILLARY
Glucose-Capillary: 140 mg/dL — ABNORMAL HIGH (ref 70–99)
Glucose-Capillary: 202 mg/dL — ABNORMAL HIGH (ref 70–99)
Glucose-Capillary: 210 mg/dL — ABNORMAL HIGH (ref 70–99)
Glucose-Capillary: 190 mg/dL — ABNORMAL HIGH (ref 70–99)

## 2021-02-13 LAB — PROTIME-INR
INR: 3 — ABNORMAL HIGH (ref 0.8–1.2)
Prothrombin Time: 30.9 seconds — ABNORMAL HIGH (ref 11.4–15.2)

## 2021-02-13 MED ORDER — LEVOTHYROXINE SODIUM 150 MCG PO TABS: 150.0000 ug | ORAL_TABLET | Freq: Every day | ORAL | 0 refills | Status: DC

## 2021-02-13 MED ORDER — CARVEDILOL 6.25 MG PO TABS: 6.2500 mg | ORAL_TABLET | Freq: Two times a day (BID) | ORAL | 0 refills | Status: DC

## 2021-02-13 MED ORDER — MORPHINE SULFATE 15 MG PO TABS: 15.0000 mg | ORAL_TABLET | ORAL | 0 refills | Status: AC | PRN

## 2021-02-13 MED ORDER — DICYCLOMINE HCL 10 MG PO CAPS: 10.0000 mg | ORAL_CAPSULE | Freq: Three times a day (TID) | ORAL | 0 refills | Status: DC

## 2021-02-13 MED ORDER — LOPERAMIDE HCL 2 MG PO CAPS: 2.0000 mg | ORAL_CAPSULE | Freq: Four times a day (QID) | ORAL | 0 refills | Status: DC | PRN

## 2021-02-13 NOTE — Progress Notes (Signed)
ANTICOAGULATION CONSULT NOTE - follow up  Pharmacy Consult for warfarin Indication: H/O DVT/PE  Patient Measurements: Height: 5\' 4"  (162.6 cm) Weight: 94.8 kg (208 lb 15.9 oz) IBW/kg (Calculated) : 54.7 Heparin Dosing Weight: 76 kg  Medications:  Warfarin PTA dosing of 6.5 mg daily  Assessment: 60 y/o F [name] on warfarin PTA for a h/o DVT/PE. Pharmacy consulted to dose heparin until patient consistently tolerating PO. Patient did receive vitamin K 2 mg IV on 8/27.  Warfarin resumed 8/23, no Warfarin 8/24, received 3mg  8/25 > held. Order to resume Warfarin 8/30 > discontinued  8/31 Flex sig at 3pm, heparin stopped 11am.  Heparin and Warfarin resumed after procedure  Today, 02/13/2021 INR now back down to therapeutic range after no warfarin doses since 9/6 but still at upper end of goal CBC: Hgb stable, Plts stable SCr 0.79 stable ( 9/7)  No bleeding or infusion issues noted per nursing  No consistent eating of meals as per charting  Goal of Therapy:  INR 2-3 Monitor platelets by anticoagulation protocol: Yes   Plan:  Continue to hold warfarin today Daily CBC, heparin level, PT/INR Monitor closely for s/sx of bleeding    Adrian Saran, PharmD, BCPS Secure Chat if ?s 02/13/2021 8:29 AM

## 2021-02-13 NOTE — TOC Transition Note (Signed)
Transition of Care [facility]) - CM/SW Discharge Note   Patient Details  Name: Connie Ruiz MRN: [id number] Date of Birth: 25-Sep-1960  [facility] CM/SW Contact:  Dessa Phi, RN Phone Number: 02/13/2021, 12:47 PM   Clinical Narrative:  Denied P2P for ST SNF @ [facility];dtr [name] aware-declined Appeal-agreed to return back to [address]-going to rm#[phone],nsg call report tel#[phone]. PTAR called. No further CM needs.    Final next level of care: Skilled Nursing Facility Barriers to Discharge: No Barriers Identified   Patient Goals and CMS Choice Patient states their goals for this hospitalization and ongoing recovery are:: To return back to SNF. CMS Medicare.gov Compare Post Acute Care list provided to:: Patient Represenative (must comment) Choice offered to / list presented to : Adult Children  Discharge Placement              Patient chooses bed at: [facility] Patient to be transferred to facility by: [place] Name of family member notified: [name] dtr [phone] Patient and family notified of of transfer: 02/13/21  Discharge Plan and Services     Post Acute Care Choice: [place]                               Social Determinants of Health (SDOH) Interventions     Readmission Risk Interventions No flowsheet data found.

## 2021-02-13 NOTE — NC FL2 (Signed)
[place] LEVEL OF CARE SCREENING TOOL     IDENTIFICATION  Patient Name: Connie Ruiz Birthdate: 04-30-1961 Sex: female Admission Date (Current Location): 01/26/2021  [place] and [place] Number:  Kathleen Argue [id number] [place] and Address:  [facility],  [place], [place]      Provider Number: [id number]  Attending Physician Name and Address:  Little Ishikawa, MD  Relative Name and Phone Number:  Rhyder, Bratz Daughter [place] Brother   [phone]    Current Level of Care: Hospital Recommended Level of Care: Nursing Facility Prior Approval Number:    Date Approved/Denied:   PASRR Number: [id number]  Discharge Plan: Other (Comment) (LTC)    Current Diagnoses: Patient Active Problem List   Diagnosis Date Noted   Enteritis 01/27/2021   Hypothyroidism, 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   Hypothyroidism 01/14/2018   HLD (hyperlipidemia) 01/14/2018   CVA (cerebral vascular accident) ([place]) 01/12/2018   Chest pain 01/11/2018   Acute respiratory failure with hypoxia ([place]) 07/20/2017   Abdominal bloating 07/20/2017   Sinus tachycardia 07/20/2017   SLE (systemic lupus erythematosus related syndrome) ([place]) 07/20/2017   CAP (community acquired pneumonia) 07/16/2017   Screening examination for infectious disease 01/28/2016   Type 2 diabetes mellitus ([place]) 08/03/2015   Urinary tract infection 07/20/2015   Vascular dementia ([place]) 05/08/2015   Hemiparesis as late effect of cerebrovascular accident (CVA) ([place]) 05/08/2015   Aphasia as late effect of stroke 05/08/2015   C. difficile colitis 02/25/2015   Hypokalemia 02/25/2015   CKD (chronic kidney disease) 02/25/2015   Colitis 02/14/2015   Sepsis ([place]) 02/14/2015   Nausea vomiting and diarrhea 02/14/2015   Abdominal pain 02/14/2015   UTI (lower urinary tract infection) 02/14/2015   Abdominal pain, chronic, generalized 01/07/2015   Candida esophagitis ([place])  11/12/2014   Abnormal CT of the abdomen    Steroid-induced hyperglycemia 11/09/2014   Hypomagnesemia 11/06/2014   Acute abdominal pain    Supratherapeutic INR 11/02/2014   Jejunitis 11/02/2014   Myalgia and myositis 04/05/2014   Wellness examination 04/05/2014   Menopausal state 04/05/2014   Palpitations 08/31/2013   Hyperlipidemia 08/31/2013   Disorder of heart rhythm 08/13/2013   TIA (transient ischemic attack) 06/20/2013   Gout flare: R elbow and R shoulder 06/20/2013   Acute encephalopathy 06/14/2013   Rhabdomyolysis 06/14/2013   History of stroke with residual effects    Right sided weakness    Breast mass, left 04/30/2013   Contusion of knee, left 10/18/2012   Depression 10/05/2012   Encounter for long-term (current) use of other medications 10/04/2012   Multiple thyroid nodules 06/08/2012   Risk for falls 05/05/2012   Physical deconditioning 05/05/2012   Fever 04/27/2012   DVT (deep venous thrombosis) ([place]) 06/17/2011   Expressive aphasia 06/17/2011   Dysphasia 06/11/2011   Incontinence of urine 06/11/2011   Hand pain, left 06/11/2011   Immunosuppressive management encounter following kidney transplant 06-08-2011   Hypertension goal BP (blood pressure) < 130/80 June 08, 2011   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, 2011   Arm pain, left 05/07/2011   Dysfunctional voiding of urine 04/28/2011   Urge incontinence 04/28/2011   Urinary urgency 04/28/2011   Routine general medical examination at a health care facility 04/13/2011   Anticoagulated 01/08/2011   Pulmonary embolism ([place]) 07/16/2010   Cerebral artery occlusion with cerebral infarction ([place]) 04/17/2010   CEREBROVASCULAR ACCIDENT, ACUTE 04/15/2010   THYROID NODULE, LEFT 04/10/2009   Cough 03/26/2009   ACUTE BRONCHITIS 08/22/2007   KIDNEY TRANSPLANTATION, HX  OF 08/22/2007   Gout 08/21/2007   Essential hypertension 08/21/2007   Chronic diastolic congestive heart failure ([place]) 08/21/2007   GERD  08/21/2007   Disorder resulting from impaired renal function 08/21/2007   LUPUS 08/21/2007   Osteoporosis 08/21/2007   DVT, HX OF 08/21/2007   Enteritis due to Clostridium difficile 08/21/2007    Orientation RESPIRATION BLADDER Height & Weight     Self, Time, Situation, Place  O2 (2L) Incontinent Weight: 94.8 kg Height:  5\' 4"  (162.6 cm)  BEHAVIORAL SYMPTOMS/MOOD NEUROLOGICAL BOWEL NUTRITION STATUS      Incontinent Diet  AMBULATORY STATUS COMMUNICATION OF NEEDS Skin   Total Care Verbally Normal                       Personal Care Assistance Level of Assistance  Bathing, Feeding, Dressing Bathing Assistance: Maximum assistance Feeding assistance: Limited assistance Dressing Assistance: Maximum assistance     Functional Limitations Info  Sight, Speech, Hearing Sight Info: Adequate Hearing Info: Adequate Speech Info: Adequate    SPECIAL CARE FACTORS FREQUENCY  PT (By licensed PT), OT (By licensed OT)     PT Frequency: Minimum 5x a week OT Frequency: Minimum 5x a week            Contractures Contractures Info: Not present    Additional Factors Info  Code Status, Allergies, Insulin Sliding Scale, Psychotropic Code Status Info:  (Full code) Allergies Info: Oxycodone-acetaminophen   Propoxyphene   Propoxyphene N-acetaminophen   Sulfonamide Derivatives   Codeine   Hydrocodone-acetaminophen   Hydromorphone   Other   Oxycodone   Sulfamethoxazole   Tape   Gabapentin   Latex   Metoprolol   Morphine And Related   Rosiglitazone Psychotropic Info:  (Aricept see d/c summary) Insulin Sliding Scale Info:  (SSI)       Current Medications (02/13/2021):  This is the current hospital active medication list Current Facility-Administered Medications  Medication Dose Route Frequency Provider Last Rate Last Admin   acetaminophen (TYLENOL) tablet 650 mg  650 mg Oral Q6H PRN Rai, Ripudeep K, MD   650 mg at 02/10/21 2046   Or   acetaminophen (TYLENOL) suppository 650 mg  650 mg  Rectal Q6H PRN Rai, Ripudeep K, MD   650 mg at 01/30/21 1751   carvedilol (COREG) tablet 6.25 mg  6.25 mg Oral BID WC Kathie Dike, MD   6.25 mg at 02/13/21 0745   dicyclomine (BENTYL) capsule 10 mg  10 mg Oral TID AC Memon, Jolaine Artist, MD   10 mg at 02/13/21 1140   diltiazem (CARDIZEM CD) 24 hr capsule 360 mg  360 mg Oral Daily Rai, Ripudeep K, MD   360 mg at 02/13/21 0931   diltiazem (CARDIZEM) injection 10 mg  10 mg Intravenous Q6H PRN Kathie Dike, MD       donepezil (ARICEPT) tablet 10 mg  10 mg Oral QHS Rai, Ripudeep K, MD   10 mg at 02/12/21 2225   famotidine (PEPCID) tablet 20 mg  20 mg Oral BID Kathie Dike, MD   20 mg at 02/13/21 0932   feeding supplement (BOOST / RESOURCE BREEZE) liquid 1 Container  1 Container Oral BID BM Kathie Dike, MD   1 Container at 02/13/21 0932   feeding supplement (ENSURE ENLIVE / ENSURE PLUS) liquid 237 mL  237 mL Oral Q24H Kathie Dike, MD   237 mL at 02/12/21 1618   fiber (NUTRISOURCE FIBER) 1 packet  1 packet Oral BID Kathie Dike, MD  1 packet at 02/13/21 0930   furosemide (LASIX) tablet 20 mg  20 mg Oral Daily Kathie Dike, MD   20 mg at 02/13/21 0930   hydrALAZINE (APRESOLINE) injection 10 mg  10 mg Intravenous Q6H PRN Rai, Ripudeep K, MD   10 mg at 02/06/21 1552   insulin aspart (novoLOG) injection 0-5 Units  0-5 Units Subcutaneous QHS Rai, Ripudeep K, MD   2 Units at 02/12/21 2238   insulin aspart (novoLOG) injection 0-9 Units  0-9 Units Subcutaneous TID WC Rai, Ripudeep K, MD   3 Units at 02/13/21 1140   levothyroxine (SYNTHROID) tablet 150 mcg  150 mcg Oral Q0600 Eudelia Bunch, RPH   150 mcg at 02/13/21 5465   loperamide (IMODIUM) capsule 2 mg  2 mg Oral Q6H PRN Kathie Dike, MD       MEDLINE mouth rinse  15 mL Mouth Rinse BID Kathie Dike, MD   15 mL at 02/13/21 0932   mirtazapine (REMERON) tablet 30 mg  30 mg Oral QHS Rai, Ripudeep K, MD   30 mg at 02/12/21 2225   morphine (MSIR) tablet 15 mg  15 mg Oral Q4H PRN Kathie Dike, MD   15 mg at 02/13/21 0230   multivitamin with minerals tablet 1 tablet  1 tablet Oral Daily Rai, Ripudeep K, MD   1 tablet at 02/13/21 0930   predniSONE (DELTASONE) tablet 5 mg  5 mg Oral Q breakfast Kathie Dike, MD   5 mg at 02/13/21 0745   promethazine (PHENERGAN) 12.5 mg in sodium chloride 0.9 % 50 mL IVPB  12.5 mg Intravenous Q6H PRN Kathie Dike, MD       tacrolimus (PROGRAF) capsule 2.5 mg  2.5 mg Oral Q12H Rai, Ripudeep K, MD   2.5 mg at 02/13/21 0930   Warfarin - Pharmacist Dosing Inpatient   Does not apply [place] [facility], [facility]         Discharge Medications: Please see discharge summary for a list of discharge medications.  Relevant Imaging Results:  Relevant Lab Results:   Additional Information SS#[phone]  Leiam Hopwood, Juliann Pulse, RN

## 2021-02-13 NOTE — Plan of Care (Signed)
  Problem: Health Behavior/Discharge Planning: Goal: Ability to manage health-related needs will improve Outcome: Progressing   Problem: Nutrition: Goal: Adequate nutrition will be maintained Outcome: Progressing   Problem: Elimination: Goal: Will not experience complications related to bowel motility Outcome: Progressing   Problem: Safety: Goal: Ability to remain free from injury will improve Outcome: Progressing

## 2021-02-13 NOTE — Plan of Care (Signed)
Patient discharged.

## 2021-02-15 ENCOUNTER — Other Ambulatory Visit: Payer: Self-pay

## 2021-02-15 ENCOUNTER — Emergency Department (HOSPITAL_COMMUNITY)
Admission: EM | Admit: 2021-02-15 | Discharge: 2021-02-16 | Disposition: A | Discharge status: Home / Self Care | Payer: Medicare Other | Attending: Emergency Medicine | Admitting: Emergency Medicine

## 2021-02-15 DIAGNOSIS — I130 Hypertensive heart and chronic kidney disease with heart failure and stage 1 through stage 4 chronic kidney disease, or unspecified chronic kidney disease: Secondary | ICD-10-CM | POA: Insufficient documentation

## 2021-02-15 DIAGNOSIS — R112 Nausea with vomiting, unspecified: Secondary | ICD-10-CM | POA: Diagnosis not present

## 2021-02-15 DIAGNOSIS — I5032 Chronic diastolic (congestive) heart failure: Secondary | ICD-10-CM | POA: Insufficient documentation

## 2021-02-15 DIAGNOSIS — R1084 Generalized abdominal pain: Secondary | ICD-10-CM | POA: Diagnosis not present

## 2021-02-15 DIAGNOSIS — E039 Hypothyroidism, unspecified: Secondary | ICD-10-CM | POA: Diagnosis not present

## 2021-02-15 DIAGNOSIS — N189 Chronic kidney disease, unspecified: Secondary | ICD-10-CM | POA: Diagnosis not present

## 2021-02-15 DIAGNOSIS — Z794 Long term (current) use of insulin: Secondary | ICD-10-CM | POA: Insufficient documentation

## 2021-02-15 DIAGNOSIS — R197 Diarrhea, unspecified: Secondary | ICD-10-CM | POA: Diagnosis not present

## 2021-02-15 DIAGNOSIS — Z79899 Other long term (current) drug therapy: Secondary | ICD-10-CM | POA: Diagnosis not present

## 2021-02-15 DIAGNOSIS — R109 Unspecified abdominal pain: Secondary | ICD-10-CM | POA: Diagnosis not present

## 2021-02-15 DIAGNOSIS — Z91040 Latex allergy status: Secondary | ICD-10-CM | POA: Insufficient documentation

## 2021-02-15 DIAGNOSIS — K5730 Diverticulosis of large intestine without perforation or abscess without bleeding: Secondary | ICD-10-CM | POA: Diagnosis not present

## 2021-02-15 DIAGNOSIS — E1122 Type 2 diabetes mellitus with diabetic chronic kidney disease: Secondary | ICD-10-CM | POA: Insufficient documentation

## 2021-02-15 DIAGNOSIS — N261 Atrophy of kidney (terminal): Secondary | ICD-10-CM | POA: Diagnosis not present

## 2021-02-15 DIAGNOSIS — Z7901 Long term (current) use of anticoagulants: Secondary | ICD-10-CM | POA: Diagnosis not present

## 2021-02-15 DIAGNOSIS — Z7984 Long term (current) use of oral hypoglycemic drugs: Secondary | ICD-10-CM | POA: Insufficient documentation

## 2021-02-15 DIAGNOSIS — Z743 Need for continuous supervision: Secondary | ICD-10-CM | POA: Diagnosis not present

## 2021-02-15 DIAGNOSIS — N281 Cyst of kidney, acquired: Secondary | ICD-10-CM | POA: Diagnosis not present

## 2021-02-15 NOTE — ED Triage Notes (Signed)
[facility] from [facility]. Pt has been having abdominal pain, was recently diagnosed with cdiff. Pt did not want to take her pain medication. Pt requested to come to the ED.

## 2021-02-16 ENCOUNTER — Emergency Department (HOSPITAL_COMMUNITY): Payer: Medicare Other

## 2021-02-16 DIAGNOSIS — N189 Chronic kidney disease, unspecified: Secondary | ICD-10-CM | POA: Diagnosis not present

## 2021-02-16 DIAGNOSIS — M6281 Muscle weakness (generalized): Secondary | ICD-10-CM | POA: Diagnosis not present

## 2021-02-16 DIAGNOSIS — R112 Nausea with vomiting, unspecified: Secondary | ICD-10-CM | POA: Diagnosis not present

## 2021-02-16 DIAGNOSIS — Z7401 Bed confinement status: Secondary | ICD-10-CM | POA: Diagnosis not present

## 2021-02-16 DIAGNOSIS — R1312 Dysphagia, oropharyngeal phase: Secondary | ICD-10-CM | POA: Diagnosis not present

## 2021-02-16 DIAGNOSIS — R2681 Unsteadiness on feet: Secondary | ICD-10-CM | POA: Diagnosis not present

## 2021-02-16 DIAGNOSIS — I69351 Hemiplegia and hemiparesis following cerebral infarction affecting right dominant side: Secondary | ICD-10-CM | POA: Diagnosis not present

## 2021-02-16 DIAGNOSIS — Z743 Need for continuous supervision: Secondary | ICD-10-CM | POA: Diagnosis not present

## 2021-02-16 DIAGNOSIS — M328 Other forms of systemic lupus erythematosus: Secondary | ICD-10-CM | POA: Diagnosis not present

## 2021-02-16 DIAGNOSIS — E0822 Diabetes mellitus due to underlying condition with diabetic chronic kidney disease: Secondary | ICD-10-CM | POA: Diagnosis not present

## 2021-02-16 DIAGNOSIS — M3219 Other organ or system involvement in systemic lupus erythematosus: Secondary | ICD-10-CM | POA: Diagnosis not present

## 2021-02-16 DIAGNOSIS — T8611 Kidney transplant rejection: Secondary | ICD-10-CM | POA: Diagnosis not present

## 2021-02-16 DIAGNOSIS — R1084 Generalized abdominal pain: Secondary | ICD-10-CM | POA: Diagnosis not present

## 2021-02-16 DIAGNOSIS — I69353 Hemiplegia and hemiparesis following cerebral infarction affecting right non-dominant side: Secondary | ICD-10-CM | POA: Diagnosis not present

## 2021-02-16 DIAGNOSIS — T8619 Other complication of kidney transplant: Secondary | ICD-10-CM | POA: Diagnosis not present

## 2021-02-16 DIAGNOSIS — K5730 Diverticulosis of large intestine without perforation or abscess without bleeding: Secondary | ICD-10-CM | POA: Diagnosis not present

## 2021-02-16 DIAGNOSIS — R5383 Other fatigue: Secondary | ICD-10-CM | POA: Diagnosis not present

## 2021-02-16 DIAGNOSIS — E039 Hypothyroidism, unspecified: Secondary | ICD-10-CM | POA: Diagnosis not present

## 2021-02-16 DIAGNOSIS — N186 End stage renal disease: Secondary | ICD-10-CM | POA: Diagnosis not present

## 2021-02-16 DIAGNOSIS — N261 Atrophy of kidney (terminal): Secondary | ICD-10-CM | POA: Diagnosis not present

## 2021-02-16 DIAGNOSIS — R29898 Other symptoms and signs involving the musculoskeletal system: Secondary | ICD-10-CM | POA: Diagnosis not present

## 2021-02-16 DIAGNOSIS — Z794 Long term (current) use of insulin: Secondary | ICD-10-CM | POA: Diagnosis not present

## 2021-02-16 DIAGNOSIS — Z91040 Latex allergy status: Secondary | ICD-10-CM | POA: Diagnosis not present

## 2021-02-16 DIAGNOSIS — E1122 Type 2 diabetes mellitus with diabetic chronic kidney disease: Secondary | ICD-10-CM | POA: Diagnosis not present

## 2021-02-16 DIAGNOSIS — Z86718 Personal history of other venous thrombosis and embolism: Secondary | ICD-10-CM | POA: Diagnosis not present

## 2021-02-16 DIAGNOSIS — I130 Hypertensive heart and chronic kidney disease with heart failure and stage 1 through stage 4 chronic kidney disease, or unspecified chronic kidney disease: Secondary | ICD-10-CM | POA: Diagnosis not present

## 2021-02-16 DIAGNOSIS — E785 Hyperlipidemia, unspecified: Secondary | ICD-10-CM | POA: Diagnosis not present

## 2021-02-16 DIAGNOSIS — N281 Cyst of kidney, acquired: Secondary | ICD-10-CM | POA: Diagnosis not present

## 2021-02-16 DIAGNOSIS — R109 Unspecified abdominal pain: Secondary | ICD-10-CM | POA: Diagnosis not present

## 2021-02-16 DIAGNOSIS — I5032 Chronic diastolic (congestive) heart failure: Secondary | ICD-10-CM | POA: Diagnosis not present

## 2021-02-16 DIAGNOSIS — I10 Essential (primary) hypertension: Secondary | ICD-10-CM | POA: Diagnosis not present

## 2021-02-16 DIAGNOSIS — D6861 Antiphospholipid syndrome: Secondary | ICD-10-CM | POA: Diagnosis not present

## 2021-02-16 DIAGNOSIS — Z7901 Long term (current) use of anticoagulants: Secondary | ICD-10-CM | POA: Diagnosis not present

## 2021-02-16 DIAGNOSIS — G894 Chronic pain syndrome: Secondary | ICD-10-CM | POA: Diagnosis not present

## 2021-02-16 DIAGNOSIS — Z79899 Other long term (current) drug therapy: Secondary | ICD-10-CM | POA: Diagnosis not present

## 2021-02-16 DIAGNOSIS — Z7984 Long term (current) use of oral hypoglycemic drugs: Secondary | ICD-10-CM | POA: Diagnosis not present

## 2021-02-16 DIAGNOSIS — E1142 Type 2 diabetes mellitus with diabetic polyneuropathy: Secondary | ICD-10-CM | POA: Diagnosis not present

## 2021-02-16 DIAGNOSIS — K529 Noninfective gastroenteritis and colitis, unspecified: Secondary | ICD-10-CM | POA: Diagnosis not present

## 2021-02-16 DIAGNOSIS — R197 Diarrhea, unspecified: Secondary | ICD-10-CM | POA: Diagnosis not present

## 2021-02-16 LAB — URINALYSIS, ROUTINE W REFLEX MICROSCOPIC
Bilirubin Urine: NEGATIVE
Glucose, UA: NEGATIVE mg/dL
Hgb urine dipstick: NEGATIVE
Ketones, ur: NEGATIVE mg/dL
Leukocytes,Ua: NEGATIVE
Nitrite: NEGATIVE
Specific Gravity, Urine: 1.01 (ref 1.005–1.030)
pH: 8.5 — ABNORMAL HIGH (ref 5.0–8.0)

## 2021-02-16 LAB — COMPREHENSIVE METABOLIC PANEL
ALT: 16 U/L (ref 0–44)
AST: 17 U/L (ref 15–41)
Albumin: 2.6 g/dL — ABNORMAL LOW (ref 3.5–5.0)
Alkaline Phosphatase: 47 U/L (ref 38–126)
Anion gap: 8 (ref 5–15)
BUN: 14 mg/dL (ref 6–20)
CO2: 26 mmol/L (ref 22–32)
Calcium: 8.5 mg/dL — ABNORMAL LOW (ref 8.9–10.3)
Chloride: 106 mmol/L (ref 98–111)
Creatinine, Ser: 0.55 mg/dL (ref 0.44–1.00)
GFR, Estimated: >60 mL/min (ref >60)
Glucose, Bld: 92 mg/dL (ref 70–99)
Potassium: 3.9 mmol/L (ref 3.5–5.1)
Sodium: 140 mmol/L (ref 135–145)
Total Bilirubin: 0.7 mg/dL (ref 0.3–1.2)
Total Protein: 5.8 g/dL — ABNORMAL LOW (ref 6.5–8.1)

## 2021-02-16 LAB — CBC WITH DIFFERENTIAL/PLATELET
Abs Immature Granulocytes: 0.05 10*3/uL (ref 0.00–0.07)
Basophils Absolute: 0 10*3/uL (ref 0.0–0.1)
Basophils Relative: 0 %
Eosinophils Absolute: 0.1 10*3/uL (ref 0.0–0.5)
Eosinophils Relative: 1 %
HCT: 38.3 % (ref 36.0–46.0)
Hemoglobin: 11.7 g/dL — ABNORMAL LOW (ref 12.0–15.0)
Immature Granulocytes: 1 %
Lymphocytes Relative: 32 %
Lymphs Abs: 3.6 10*3/uL (ref 0.7–4.0)
MCH: 27.1 pg (ref 26.0–34.0)
MCHC: 30.5 g/dL (ref 30.0–36.0)
MCV: 88.9 fL (ref 80.0–100.0)
Monocytes Absolute: 0.6 10*3/uL (ref 0.1–1.0)
Monocytes Relative: 6 %
Neutro Abs: 6.6 10*3/uL (ref 1.7–7.7)
Neutrophils Relative %: 60 %
Platelets: 286 10*3/uL (ref 150–400)
RBC: 4.31 MIL/uL (ref 3.87–5.11)
RDW: 16.2 % — ABNORMAL HIGH (ref 11.5–15.5)
WBC: 11.1 10*3/uL — ABNORMAL HIGH (ref 4.0–10.5)
nRBC: 0 % (ref 0.0–0.2)

## 2021-02-16 LAB — PROTIME-INR
INR: 1.8 — ABNORMAL HIGH (ref 0.8–1.2)
Prothrombin Time: 20.8 seconds — ABNORMAL HIGH (ref 11.4–15.2)

## 2021-02-16 LAB — GLUCOSE, CAPILLARY: Glucose-Capillary: 193 mg/dL — ABNORMAL HIGH (ref 70–99)

## 2021-02-16 LAB — LACTIC ACID, PLASMA: Lactic Acid, Venous: 1 mmol/L (ref 0.5–1.9)

## 2021-02-16 LAB — LIPASE, BLOOD: Lipase: 29 U/L (ref 11–51)

## 2021-02-16 MED ORDER — FENTANYL CITRATE PF 50 MCG/ML IJ SOSY
50.0000 ug | PREFILLED_SYRINGE | Freq: Once | INTRAMUSCULAR | Status: AC
  Administered 2021-02-16: 50 ug via INTRAVENOUS
  Filled 2021-02-16: qty 1

## 2021-02-16 MED ORDER — SODIUM CHLORIDE 0.9 % IV BOLUS
1000.0000 mL | Freq: Once | INTRAVENOUS | Status: AC
  Administered 2021-02-16: 1000 mL via INTRAVENOUS

## 2021-02-16 MED ORDER — ONDANSETRON HCL 4 MG/2ML IJ SOLN
4.0000 mg | Freq: Once | INTRAMUSCULAR | Status: AC
  Administered 2021-02-16: 4 mg via INTRAVENOUS
  Filled 2021-02-16: qty 2

## 2021-02-16 MED ORDER — DICYCLOMINE HCL 10 MG/ML IM SOLN
20.0000 mg | Freq: Once | INTRAMUSCULAR | Status: AC
  Administered 2021-02-16: 20 mg via INTRAMUSCULAR
  Filled 2021-02-16: qty 2

## 2021-02-16 NOTE — ED Provider Notes (Signed)
[facility] DEPT Provider Note   CSN: [id number] Arrival date & time: 02/15/21  2310     History Chief Complaint  Patient presents with   Abdominal Pain    SHADEE RATHOD is a 60 y.o. female.  Patient with a history of kidney transplant, DVT on Coumadin, CVA, recent admission for diarrhea and questionable C. difficile colitis presenting with abdominal pain, nausea, vomiting and diarrhea.  She was discharged to the hospital 2 days ago with after a 20-day admission for enteritis and questionable C. difficile.  It was determined that she does not actually have C. difficile and treatment with fidaxomicin was stopped.  States she came back tonight because she is having diffuse abdominal pain similar to when she was in the hospital.  She was apparently refusing pain medication at her facility.  States she has been having vomiting and diarrhea but cannot quantify how much.  Denies any black or bloody stools.  Denies any fever. She is a poor historian.  No chest pain or shortness of breath.  No pain with urination or blood in the urine  The history is provided by the patient.  Abdominal Pain Associated symptoms: diarrhea, nausea and vomiting   Associated symptoms: no cough, no dysuria, no fatigue, no fever, no hematuria and no shortness of breath       Past Medical History:  Diagnosis Date   Anxiety    Candida esophagitis ([place]) 11/12/2014   CEREBROVASCULAR ACCIDENT, ACUTE 04/15/2010   CLOSTRIDIUM DIFFICILE COLITIS, HX OF 08/21/2007   CONGESTIVE HEART FAILURE 08/21/2007   Current use of long term anticoagulation    Dr. Andree Elk, [facility]   CVA 04/17/2010   Depression    Dr. Andree Elk, [place], TYPE II 08/21/2007   DVT, HX OF 08/21/2007   GERD 08/21/2007   GOUT 08/21/2007   History of stroke with residual effects    HYPERLIPIDEMIA 08/21/2007   HYPERTENSION 08/21/2007   Dr. Andree Elk, [place], HX OF 08/22/2007   s/p renal  transplant-Dr. 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   Hypothyroidism 01/14/2018   HLD (hyperlipidemia) 01/14/2018   CVA (cerebral vascular accident) ([place]) 01/12/2018   Chest pain 01/11/2018   Acute respiratory failure with hypoxia ([place]) 07/20/2017   Abdominal bloating 07/20/2017   Sinus tachycardia 07/20/2017   SLE (systemic lupus erythematosus related syndrome) ([place]) 07/20/2017   CAP (community acquired pneumonia) 07/16/2017   Screening examination for infectious disease 01/28/2016   Type 2 diabetes mellitus ([place]) 08/03/2015   Urinary tract infection 07/20/2015   Vascular dementia ([place]) 05/08/2015   Hemiparesis as late effect of cerebrovascular accident (CVA) ([place]) 05/08/2015   Aphasia as late effect of stroke 05/08/2015   C. difficile colitis 02/25/2015   Hypokalemia 02/25/2015   CKD (chronic kidney disease) 02/25/2015   Colitis 02/14/2015   Sepsis ([place]) 02/14/2015   Nausea vomiting and diarrhea 02/14/2015   Abdominal pain 02/14/2015   UTI (lower urinary tract infection) 02/14/2015   Abdominal pain, chronic, generalized 01/07/2015   Candida esophagitis ([place]) 11/12/2014   Abnormal CT of the abdomen  Steroid-induced hyperglycemia 11/09/2014   Hypomagnesemia 11/06/2014   Acute abdominal pain    Supratherapeutic INR 11/02/2014   Jejunitis 11/02/2014   Myalgia and myositis 04/05/2014   Wellness examination 04/05/2014   Menopausal state 04/05/2014   Palpitations 08/31/2013   Hyperlipidemia 08/31/2013   Disorder of heart rhythm 08/13/2013   TIA (transient ischemic attack) 06/20/2013   Gout flare: R elbow and R  shoulder 06/20/2013   Acute encephalopathy 06/14/2013   Rhabdomyolysis 06/14/2013   History of stroke with residual effects    Right sided weakness    Breast mass, left 04/30/2013   Contusion of knee, left 10/18/2012   Depression 10/05/2012   Encounter for long-term (current) use of other medications 10/04/2012   Multiple thyroid nodules 06/08/2012   Risk for falls 05/05/2012   Physical deconditioning 05/05/2012   Fever 04/27/2012   DVT (deep venous thrombosis) ([place]) 06/17/2011   Expressive aphasia 06/17/2011   Dysphasia 06/11/2011   Incontinence of urine 06/11/2011   Hand pain, left 06/11/2011   Immunosuppressive management encounter following kidney transplant 2011/06/05   Hypertension goal BP (blood pressure) < 130/80 2011-06-05   Deceased-donor kidney transplant recipient 06/05/2011   Arm pain, left 05/07/2011   Dysfunctional voiding of urine 04/28/2011   Urge incontinence 04/28/2011   Urinary urgency 04/28/2011   Routine general medical examination at a health care facility 04/13/2011   Anticoagulated 01/08/2011   Pulmonary embolism ([place]) 07/16/2010   Cerebral artery occlusion with cerebral infarction ([place]) 04/17/2010   CEREBROVASCULAR ACCIDENT, ACUTE 04/15/2010   THYROID NODULE, LEFT 04/10/2009   Cough 03/26/2009   ACUTE BRONCHITIS 08/22/2007   KIDNEY TRANSPLANTATION, HX OF 08/22/2007   Gout 08/21/2007   Essential hypertension 08/21/2007   Chronic diastolic congestive heart failure ([place]) 08/21/2007   GERD 08/21/2007   Disorder resulting from impaired renal function 08/21/2007   LUPUS 08/21/2007   Osteoporosis 08/21/2007   DVT, HX OF 08/21/2007   Enteritis due to Clostridium difficile 08/21/2007    Past Surgical History:  Procedure Laterality Date   BIOPSY  02/04/2021   Procedure: BIOPSY;  Surgeon: [name], MD;  Location: [facility];  Service: Endoscopy;;   CESAREAN SECTION     CHOLECYSTECTOMY     COLONOSCOPY  06/2000   [facility]    ENTEROSCOPY N/A  11/11/2014   Procedure: ENTEROSCOPY;  Surgeon: Ladene Artist, MD;  Location: [facility];  Service: Endoscopy;  Laterality: N/A;   FLEXIBLE SIGMOIDOSCOPY N/A 02/04/2021   Procedure: FLEXIBLE SIGMOIDOSCOPY;  Surgeon: [name], MD;  Location: [facility];  Service: Endoscopy;  Laterality: N/A;   KIDNEY TRANSPLANT Right 2009   RENAL BIOPSY, OPEN  1981   TUBAL LIGATION       OB History     Gravida  1   Para  1   Term  1   Preterm      AB      Living  1      SAB      IAB      Ectopic      Multiple      Live Births  1           Family History  Problem Relation Age of Onset   Heart attack Mother    Heart disease Father    Asthma Sister    Asthma Daughter    Cancer Maternal Grandfather        prostate   Cancer Paternal Grandfather        colon  Social History   Tobacco Use   Smoking status: Never   Smokeless tobacco: Never  Vaping Use   Vaping Use: Never used  Substance Use Topics   Alcohol use: No    Alcohol/week: 0.0 standard drinks   Drug use: No    Home Medications Prior to Admission medications   Medication Sig Start Date End Date Taking? Authorizing Provider  acetaminophen (TYLENOL) 500 MG tablet Take 1,000 mg by mouth 4 (four) times daily as needed for mild pain or moderate pain.    [provider]  alendronate (FOSAMAX) 70 MG tablet TAKE 1 TABLET BY MOUTH ONCE A WEEK ON AN EMPTY STOMACH ON SATURDAYS WITH A FULL GLASS OF WATER. Patient taking differently: Take 70 mg by mouth once a week. 09/17/17   Renato Shin, MD  Blood Glucose Monitoring Suppl (ACCU-CHEK AVIVA PLUS) w/Device KIT 1 Device by Does not apply route daily. 10/13/18   [name], [name], MD  calcitRIOL (ROCALTROL) 0.25 MCG capsule TAKE 1 CAPSULE(0.25 MCG) BY MOUTH DAILY. NO REFILLS WITHOUT APPOINTMENT Patient not taking: No sig reported 02/13/19   Renato Shin, MD  carvedilol (COREG) 6.25 MG tablet Take 1 tablet (6.25 mg total) by mouth 2 (two) times daily with a meal.  02/13/21   Little Ishikawa, MD  cholecalciferol (VITAMIN D3) 25 MCG (1000 UNIT) tablet Take 1,000 Units by mouth daily.    [provider]  dicyclomine (BENTYL) 10 MG capsule Take 1 capsule (10 mg total) by mouth 3 (three) times daily before meals. 02/13/21   Little Ishikawa, MD  diltiazem [facility]) 360 MG 24 hr capsule Take 1 capsule (360 mg total) by mouth daily. 06/27/19   [name], [name], MD  donepezil (ARICEPT) 10 MG tablet Take 10 mg by mouth at bedtime.  11/18/16   [provider]  esomeprazole (NEXIUM) 20 MG capsule TAKE 1 CAPSULE(20 MG) BY MOUTH DAILY Patient taking differently: Take 20 mg by mouth daily at 12 noon. 07/17/18   Renato Shin, MD  famotidine (PEPCID) 20 MG tablet Take 20 mg by mouth 2 (two) times daily.    [provider]  feeding supplement, ENSURE ENLIVE, (ENSURE ENLIVE) LIQD Take 237 mLs by mouth 2 (two) times daily between meals. Patient taking differently: Take 237 mLs by mouth as needed (when pt remembers). 08/22/18   Pokhrel, Corrie Mckusick, MD  furosemide (LASIX) 20 MG tablet Take 20 mg by mouth daily. 04/25/19   [provider]  glucose blood (ONE TOUCH ULTRA TEST) test strip Use to check blood sugar 1 time per day 02/12/15   Renato Shin, MD  insulin lispro (HUMALOG) 100 UNIT/ML injection Inject 0-12 Units into the skin 3 (three) times daily before meals. Per Sliding Scale. If blood sugar is: 0-150: 0 units 151-200: 2 units 201-250: 4 units 251-300: 6 units 301-350: 8 units 351-400: 10 units 401-450: 12 units    [provider]  levothyroxine (SYNTHROID) 150 MCG tablet Take 1 tablet (150 mcg total) by mouth daily at 6 (six) AM. 02/14/21   Little Ishikawa, MD  loperamide (IMODIUM) 2 MG capsule Take 1 capsule (2 mg total) by mouth every 6 (six) hours as needed for diarrhea or loose stools. 02/13/21   Little Ishikawa, MD  magnesium oxide (MAG-OX) 400 MG tablet Take 800 mg by mouth daily.  01/11/17   [provider]  metFORMIN (GLUCOPHAGE-XR) 500 MG 24 hr tablet TAKE 1 TABLET(500 MG) BY MOUTH DAILY WITH BREAKFAST Patient taking differently: Take 500 mg  by mouth daily with breakfast. 06/05/19   [name], [name], MD  mirtazapine (REMERON) 30 MG tablet Take 1 tablet (30 mg total) by mouth at bedtime. 07/28/15   Pieter Partridge, DO  morphine (MSIR) 15 MG tablet Take 1 tablet (15 mg total) by mouth every 4 (four) hours as needed for up to 3 days for moderate pain. 02/13/21 02/16/21  Little Ishikawa, MD  multivitamin (RENA-VIT) TABS tablet Take 1 tablet by mouth daily.    [provider]  mycophenolate (CELLCEPT) 250 MG capsule Take 250 mg by mouth 2 (two) times daily.  07/13/17   [provider]  NONFORMULARY OR COMPOUNDED ITEM [facility]:  Pain Cream - Ketamine 5%, Baclofen 2%, Gabapentin 5%, Lidocaine 5%, Menthol 1%, apply 1-2 grams to affected area 3-4 times a day for pain. 12/18/19   Bronson Ing, DPM  nystatin (MYCOSTATIN) powder Apply 1 g topically 4 (four) times daily as needed. For yeast under breast    [provider]  ondansetron (ZOFRAN ODT) 4 MG disintegrating tablet 78m ODT q4 hours prn nausea/vomiting Patient taking differently: Take 4 mg by mouth every 4 (four) hours as needed for nausea or vomiting. 06/15/15   Mesner, JCorene Cornea MD  OPhys[facility] CenterDELICA LANCETS [id number] Use to check blood sugar 1 time per day 02/12/15   ERenato Shin MD  Potassium Chloride ER 20 MEQ TBCR Take 1 tablet by mouth daily. NEEDS POTASSIUM RE-CHECK. 06/27/19   JMartinique [name], MD  predniSONE (DELTASONE) 5 MG tablet Take 5 mg by mouth daily with breakfast.  07/13/17   [provider]  psyllium (METAMUCIL) 58.6 % packet Take 1 packet by mouth daily.    [provider]  Skin Protectants, Misc. (EUCERIN) cream Apply 1 application topically daily. To feet    [provider]  sucralfate (CARAFATE) 1 g tablet Take 1 tablet (1 g total) by mouth 4 (four) times daily for 30  days. Patient not taking: Reported on 01/26/2021 08/22/18 09/11/19  PFlora Lipps MD  tacrolimus (PROGRAF) 1 MG capsule Take 2.5 mg by mouth every 12 (twelve) hours. 01/15/18   [provider]  warfarin (COUMADIN) 5 MG tablet TAKE 1/2 TO 1 TABLET BY MOUTH [place]2.5[place] Patient taking differently: Take 6.5 mg by mouth daily. 03/27/19   JMartinique [name], MD    Allergies    Oxycodone-acetaminophen, Propoxyphene, Propoxyphene n-acetaminophen, Sulfonamide derivatives, Codeine, Hydrocodone-acetaminophen, Hydromorphone, Other, Oxycodone, Sulfamethoxazole, Tape, Gabapentin, Latex, Metoprolol, Morphine and related, and Rosiglitazone  Review of Systems   Review of Systems  Constitutional:  Positive for activity change and appetite change. Negative for fatigue and fever.  HENT:  Negative for congestion.   Respiratory:  Negative for cough, chest tightness and shortness of breath.   Gastrointestinal:  Positive for abdominal pain, diarrhea, nausea and vomiting.  Genitourinary:  Negative for dysuria and hematuria.  Musculoskeletal:  Negative for arthralgias and myalgias.  Skin:  Negative for rash.  Neurological:  Negative for dizziness, weakness and headaches.  / all other systems are negative except as noted in the HPI and PMH.   Physical Exam Updated Vital Signs BP (!) 142/78   Pulse 75   Temp 98.5 F (36.9 C) (Oral)   Resp 18   SpO2 99%   Physical Exam Vitals and nursing note reviewed.  Constitutional:      General: She is not in acute distress.    Appearance: She is well-developed. She is obese. She is  not ill-appearing.  HENT:     Head: Normocephalic and atraumatic.     Mouth/Throat:     Mouth: Mucous membranes are dry.     Pharynx: No oropharyngeal exudate.  Eyes:     Conjunctiva/sclera: Conjunctivae normal.     Pupils: Pupils are equal, round, and reactive to light.  Neck:     Comments: No  meningismus. Cardiovascular:     Rate and Rhythm: Normal rate and regular rhythm.     Heart sounds: Normal heart sounds. No murmur heard. Pulmonary:     Effort: Pulmonary effort is normal. No respiratory distress.     Breath sounds: Normal breath sounds.  Abdominal:     Palpations: Abdomen is soft.     Tenderness: There is abdominal tenderness. There is no guarding or rebound.     Comments: Diffusely tender abdomen no guarding, no peritoneal signs.  Musculoskeletal:        General: No tenderness. Normal range of motion.     Cervical back: Normal range of motion and neck supple.  Skin:    General: Skin is warm.  Neurological:     Mental Status: She is alert and oriented to person, place, and time.     Cranial Nerves: No cranial nerve deficit.     Motor: No abnormal muscle tone.     Coordination: Coordination normal.     Comments:  5/5 strength throughout. CN 2-12 intact.Equal grip strength.   Psychiatric:        Behavior: Behavior normal.    ED Results / Procedures / Treatments   Labs (all labs ordered are listed, but only abnormal results are displayed) Labs Reviewed  CBC WITH DIFFERENTIAL/PLATELET - Abnormal; Notable for the following components:      Result Value   WBC 11.1 (*)    Hemoglobin 11.7 (*)    RDW 16.2 (*)    All other components within normal limits  PROTIME-INR - Abnormal; Notable for the following components:   Prothrombin Time 20.8 (*)    INR 1.8 (*)    All other components within normal limits  URINALYSIS, ROUTINE W REFLEX MICROSCOPIC - Abnormal; Notable for the following components:   Color, Urine YELLOW (*)    APPearance HAZY (*)    pH 8.5 (*)    Protein, ur TRACE (*)    Bacteria, UA RARE (*)    All other components within normal limits  COMPREHENSIVE METABOLIC PANEL - Abnormal; Notable for the following components:   Calcium 8.5 (*)    Total Protein 5.8 (*)    Albumin 2.6 (*)    All other components within normal limits  LACTIC ACID, PLASMA   LIPASE, BLOOD    EKG None  Radiology CT ABDOMEN PELVIS WO CONTRAST  Result Date: 02/16/2021 CLINICAL DATA:  Diarrhea. EXAM: CT ABDOMEN AND PELVIS WITHOUT CONTRAST TECHNIQUE: Multidetector CT imaging of the abdomen and pelvis was performed following the standard protocol without IV contrast. COMPARISON:  CT abdomen pelvis dated 01/29/2021. FINDINGS: Evaluation of this exam is limited in the absence of intravenous contrast. Lower chest: Bibasilar atelectasis. Three-vessel coronary vascular calcification. No intra-abdominal free air or free fluid. Hepatobiliary: The liver is unremarkable. No intrahepatic biliary dilatation. Cholecystectomy. No retained calcified stone noted in the central CBD. Pancreas: Unremarkable. No pancreatic ductal dilatation or surrounding inflammatory changes. Spleen: Normal in size without focal abnormality. Adrenals/Urinary Tract: The adrenal glands are unremarkable. Severe bilateral renal atrophy. Similar appearance of bilateral renal cysts and nodular parenchyma in the upper pole of  the left kidney. There is a right lower quadrant transplant kidney. There is no hydronephrosis or nephrolithiasis of the transplant kidney. No peritransplant fluid collection. The urinary bladder is unremarkable. Stomach/Bowel: There is diffuse colonic diverticulosis. No active inflammatory changes. There is no bowel obstruction or active inflammation. The appendix is normal. Vascular/Lymphatic: Moderate aortoiliac atherosclerotic disease. The IVC is unremarkable. No portal venous gas. There is no adenopathy. Reproductive: The uterus is anteverted. Small posterior body fibroid. No adnexal masses. Other: None Musculoskeletal: Osteopenia with degenerative changes of the spine. No acute osseous pathology. Diffuse subcutaneous edema of the hips. No fluid collection. IMPRESSION: 1. No acute intra-abdominal or pelvic pathology. 2. Colonic diverticulosis. No bowel obstruction. Normal appendix. 3. Aortic  Atherosclerosis (ICD10-I70.0). Electronically Signed   By: Anner Crete M.D.   On: 02/16/2021 02:54    Procedures Procedures   Medications Ordered in ED Medications  sodium chloride 0.9 % bolus 1,000 mL (has no administration in time range)  ondansetron (ZOFRAN) injection 4 mg (has no administration in time range)    ED Course  I have reviewed the triage vital signs and the nursing notes.  Pertinent labs & imaging results that were available during my care of the patient were reviewed by me and considered in my medical decision making (see chart for details).    MDM Rules/Calculators/A&P                          Recurrent abdominal pain with nausea, vomiting and diarrhea.  Stable vital signs.  Abdomen soft without peritoneal signs  Recent hospitalization records reviewed.  Patient had a C. difficile positive antigen but negative toxin.  Was thought her symptoms could be due to mycophenolate.  Patient given hydration and symptom control.  Labs are reassuring.  No leukocytosis.  Creatinine is at baseline.  INR slightly low at 1.8.  Diffuse abdominal tenderness on exam.  CT scan is repeated and shows no acute pathology.  Previous evidence of enteritis has resolved.  Patient reassured. Appears to have some element of chronic abdominal pain. Labs reassuring.  She is able to tolerate PO. No indication for readmission. Appears stable to return to her facility with outpatient followup with her PCP and GI.  Return precautions discussed.  Final Clinical Impression(s) / ED Diagnoses Final diagnoses:  Generalized abdominal pain    Rx / DC Orders ED Discharge Orders     None        Davy Faught, Annie Main, MD 02/16/21 1620

## 2021-02-16 NOTE — Discharge Instructions (Signed)
Your lab work today is stable.  Your CT scan shows resolution of the previous colon inflammation.  Continue to take your pain and nausea medications as prescribed.  Follow-up with your primary doctor as well as gastroenterologist.  Return to the ED with new or worsening symptoms.

## 2021-02-16 NOTE — ED Notes (Signed)
[facility] called to transport pt for discharge.

## 2021-02-16 NOTE — ED Notes (Signed)
Report called to [facility]

## 2021-02-18 DIAGNOSIS — I5032 Chronic diastolic (congestive) heart failure: Secondary | ICD-10-CM | POA: Diagnosis not present

## 2021-02-18 DIAGNOSIS — I69351 Hemiplegia and hemiparesis following cerebral infarction affecting right dominant side: Secondary | ICD-10-CM | POA: Diagnosis not present

## 2021-02-18 DIAGNOSIS — T8619 Other complication of kidney transplant: Secondary | ICD-10-CM | POA: Diagnosis not present

## 2021-02-18 DIAGNOSIS — I10 Essential (primary) hypertension: Secondary | ICD-10-CM | POA: Diagnosis not present

## 2021-02-18 DIAGNOSIS — D6861 Antiphospholipid syndrome: Secondary | ICD-10-CM | POA: Diagnosis not present

## 2021-02-18 DIAGNOSIS — M328 Other forms of systemic lupus erythematosus: Secondary | ICD-10-CM | POA: Diagnosis not present

## 2021-02-18 DIAGNOSIS — I6330 Cerebral infarction due to thrombosis of unspecified cerebral artery: Secondary | ICD-10-CM | POA: Diagnosis not present

## 2021-02-18 DIAGNOSIS — R1312 Dysphagia, oropharyngeal phase: Secondary | ICD-10-CM | POA: Diagnosis not present

## 2021-02-18 DIAGNOSIS — Z86718 Personal history of other venous thrombosis and embolism: Secondary | ICD-10-CM | POA: Diagnosis not present

## 2021-02-18 DIAGNOSIS — G894 Chronic pain syndrome: Secondary | ICD-10-CM | POA: Diagnosis not present

## 2021-02-18 DIAGNOSIS — E1142 Type 2 diabetes mellitus with diabetic polyneuropathy: Secondary | ICD-10-CM | POA: Diagnosis not present

## 2021-02-18 DIAGNOSIS — N186 End stage renal disease: Secondary | ICD-10-CM | POA: Diagnosis not present

## 2021-02-19 DIAGNOSIS — E1142 Type 2 diabetes mellitus with diabetic polyneuropathy: Secondary | ICD-10-CM | POA: Diagnosis not present

## 2021-02-19 DIAGNOSIS — I10 Essential (primary) hypertension: Secondary | ICD-10-CM | POA: Diagnosis not present

## 2021-02-19 DIAGNOSIS — M818 Other osteoporosis without current pathological fracture: Secondary | ICD-10-CM | POA: Diagnosis not present

## 2021-02-19 DIAGNOSIS — D6861 Antiphospholipid syndrome: Secondary | ICD-10-CM | POA: Diagnosis not present

## 2021-02-19 DIAGNOSIS — M328 Other forms of systemic lupus erythematosus: Secondary | ICD-10-CM | POA: Diagnosis not present

## 2021-02-19 DIAGNOSIS — N186 End stage renal disease: Secondary | ICD-10-CM | POA: Diagnosis not present

## 2021-02-19 DIAGNOSIS — I5032 Chronic diastolic (congestive) heart failure: Secondary | ICD-10-CM | POA: Diagnosis not present

## 2021-02-19 DIAGNOSIS — R1312 Dysphagia, oropharyngeal phase: Secondary | ICD-10-CM | POA: Diagnosis not present

## 2021-02-19 DIAGNOSIS — Z86718 Personal history of other venous thrombosis and embolism: Secondary | ICD-10-CM | POA: Diagnosis not present

## 2021-02-19 DIAGNOSIS — I69351 Hemiplegia and hemiparesis following cerebral infarction affecting right dominant side: Secondary | ICD-10-CM | POA: Diagnosis not present

## 2021-02-19 DIAGNOSIS — T8619 Other complication of kidney transplant: Secondary | ICD-10-CM | POA: Diagnosis not present

## 2021-02-19 DIAGNOSIS — G894 Chronic pain syndrome: Secondary | ICD-10-CM | POA: Diagnosis not present

## 2021-02-20 DIAGNOSIS — E1142 Type 2 diabetes mellitus with diabetic polyneuropathy: Secondary | ICD-10-CM | POA: Diagnosis not present

## 2021-02-20 DIAGNOSIS — I10 Essential (primary) hypertension: Secondary | ICD-10-CM | POA: Diagnosis not present

## 2021-02-20 DIAGNOSIS — M818 Other osteoporosis without current pathological fracture: Secondary | ICD-10-CM | POA: Diagnosis not present

## 2021-02-20 DIAGNOSIS — M328 Other forms of systemic lupus erythematosus: Secondary | ICD-10-CM | POA: Diagnosis not present

## 2021-02-20 DIAGNOSIS — I69351 Hemiplegia and hemiparesis following cerebral infarction affecting right dominant side: Secondary | ICD-10-CM | POA: Diagnosis not present

## 2021-02-20 DIAGNOSIS — I6330 Cerebral infarction due to thrombosis of unspecified cerebral artery: Secondary | ICD-10-CM | POA: Diagnosis not present

## 2021-02-20 DIAGNOSIS — D6861 Antiphospholipid syndrome: Secondary | ICD-10-CM | POA: Diagnosis not present

## 2021-02-20 DIAGNOSIS — N186 End stage renal disease: Secondary | ICD-10-CM | POA: Diagnosis not present

## 2021-02-20 DIAGNOSIS — G894 Chronic pain syndrome: Secondary | ICD-10-CM | POA: Diagnosis not present

## 2021-02-20 DIAGNOSIS — I5032 Chronic diastolic (congestive) heart failure: Secondary | ICD-10-CM | POA: Diagnosis not present

## 2021-02-20 DIAGNOSIS — Z86718 Personal history of other venous thrombosis and embolism: Secondary | ICD-10-CM | POA: Diagnosis not present

## 2021-02-23 DIAGNOSIS — T8619 Other complication of kidney transplant: Secondary | ICD-10-CM | POA: Diagnosis not present

## 2021-02-23 DIAGNOSIS — I69351 Hemiplegia and hemiparesis following cerebral infarction affecting right dominant side: Secondary | ICD-10-CM | POA: Diagnosis not present

## 2021-02-23 DIAGNOSIS — G894 Chronic pain syndrome: Secondary | ICD-10-CM | POA: Diagnosis not present

## 2021-02-23 DIAGNOSIS — M150 Primary generalized (osteo)arthritis: Secondary | ICD-10-CM | POA: Diagnosis not present

## 2021-02-23 DIAGNOSIS — R1312 Dysphagia, oropharyngeal phase: Secondary | ICD-10-CM | POA: Diagnosis not present

## 2021-02-23 DIAGNOSIS — E038 Other specified hypothyroidism: Secondary | ICD-10-CM | POA: Diagnosis not present

## 2021-02-23 DIAGNOSIS — N186 End stage renal disease: Secondary | ICD-10-CM | POA: Diagnosis not present

## 2021-02-23 DIAGNOSIS — M6281 Muscle weakness (generalized): Secondary | ICD-10-CM | POA: Diagnosis not present

## 2021-02-23 DIAGNOSIS — E1142 Type 2 diabetes mellitus with diabetic polyneuropathy: Secondary | ICD-10-CM | POA: Diagnosis not present

## 2021-02-23 DIAGNOSIS — I5032 Chronic diastolic (congestive) heart failure: Secondary | ICD-10-CM | POA: Diagnosis not present

## 2021-02-23 DIAGNOSIS — D6861 Antiphospholipid syndrome: Secondary | ICD-10-CM | POA: Diagnosis not present

## 2021-02-23 DIAGNOSIS — K529 Noninfective gastroenteritis and colitis, unspecified: Secondary | ICD-10-CM | POA: Diagnosis not present

## 2021-02-24 DIAGNOSIS — K529 Noninfective gastroenteritis and colitis, unspecified: Secondary | ICD-10-CM | POA: Diagnosis not present

## 2021-02-24 DIAGNOSIS — Z8619 Personal history of other infectious and parasitic diseases: Secondary | ICD-10-CM | POA: Diagnosis not present

## 2021-02-24 DIAGNOSIS — B351 Tinea unguium: Secondary | ICD-10-CM | POA: Diagnosis not present

## 2021-02-24 DIAGNOSIS — L603 Nail dystrophy: Secondary | ICD-10-CM | POA: Diagnosis not present

## 2021-02-24 DIAGNOSIS — I739 Peripheral vascular disease, unspecified: Secondary | ICD-10-CM | POA: Diagnosis not present

## 2021-02-27 DIAGNOSIS — G894 Chronic pain syndrome: Secondary | ICD-10-CM | POA: Diagnosis not present

## 2021-02-27 DIAGNOSIS — R1312 Dysphagia, oropharyngeal phase: Secondary | ICD-10-CM | POA: Diagnosis not present

## 2021-02-27 DIAGNOSIS — I69351 Hemiplegia and hemiparesis following cerebral infarction affecting right dominant side: Secondary | ICD-10-CM | POA: Diagnosis not present

## 2021-02-27 DIAGNOSIS — T8619 Other complication of kidney transplant: Secondary | ICD-10-CM | POA: Diagnosis not present

## 2021-02-27 DIAGNOSIS — E1142 Type 2 diabetes mellitus with diabetic polyneuropathy: Secondary | ICD-10-CM | POA: Diagnosis not present

## 2021-02-27 DIAGNOSIS — M6281 Muscle weakness (generalized): Secondary | ICD-10-CM | POA: Diagnosis not present

## 2021-02-27 DIAGNOSIS — E038 Other specified hypothyroidism: Secondary | ICD-10-CM | POA: Diagnosis not present

## 2021-02-27 DIAGNOSIS — N186 End stage renal disease: Secondary | ICD-10-CM | POA: Diagnosis not present

## 2021-02-27 DIAGNOSIS — D6861 Antiphospholipid syndrome: Secondary | ICD-10-CM | POA: Diagnosis not present

## 2021-02-27 DIAGNOSIS — I110 Hypertensive heart disease with heart failure: Secondary | ICD-10-CM | POA: Diagnosis not present

## 2021-02-27 DIAGNOSIS — M150 Primary generalized (osteo)arthritis: Secondary | ICD-10-CM | POA: Diagnosis not present

## 2021-02-27 DIAGNOSIS — R197 Diarrhea, unspecified: Secondary | ICD-10-CM | POA: Diagnosis not present

## 2021-02-27 DIAGNOSIS — I5032 Chronic diastolic (congestive) heart failure: Secondary | ICD-10-CM | POA: Diagnosis not present

## 2021-02-27 DIAGNOSIS — E1122 Type 2 diabetes mellitus with diabetic chronic kidney disease: Secondary | ICD-10-CM | POA: Diagnosis not present

## 2021-03-02 DIAGNOSIS — E038 Other specified hypothyroidism: Secondary | ICD-10-CM | POA: Diagnosis not present

## 2021-03-02 DIAGNOSIS — D6861 Antiphospholipid syndrome: Secondary | ICD-10-CM | POA: Diagnosis not present

## 2021-03-02 DIAGNOSIS — I6330 Cerebral infarction due to thrombosis of unspecified cerebral artery: Secondary | ICD-10-CM | POA: Diagnosis not present

## 2021-03-02 DIAGNOSIS — E1122 Type 2 diabetes mellitus with diabetic chronic kidney disease: Secondary | ICD-10-CM | POA: Diagnosis not present

## 2021-03-02 DIAGNOSIS — I5032 Chronic diastolic (congestive) heart failure: Secondary | ICD-10-CM | POA: Diagnosis not present

## 2021-03-02 DIAGNOSIS — M328 Other forms of systemic lupus erythematosus: Secondary | ICD-10-CM | POA: Diagnosis not present

## 2021-03-02 DIAGNOSIS — E1142 Type 2 diabetes mellitus with diabetic polyneuropathy: Secondary | ICD-10-CM | POA: Diagnosis not present

## 2021-03-02 DIAGNOSIS — R197 Diarrhea, unspecified: Secondary | ICD-10-CM | POA: Diagnosis not present

## 2021-03-02 DIAGNOSIS — N186 End stage renal disease: Secondary | ICD-10-CM | POA: Diagnosis not present

## 2021-03-02 DIAGNOSIS — M6281 Muscle weakness (generalized): Secondary | ICD-10-CM | POA: Diagnosis not present

## 2021-03-02 DIAGNOSIS — I69351 Hemiplegia and hemiparesis following cerebral infarction affecting right dominant side: Secondary | ICD-10-CM | POA: Diagnosis not present

## 2021-03-12 DIAGNOSIS — R197 Diarrhea, unspecified: Secondary | ICD-10-CM | POA: Diagnosis not present

## 2021-03-12 DIAGNOSIS — Z940 Kidney transplant status: Secondary | ICD-10-CM | POA: Diagnosis not present

## 2021-03-12 DIAGNOSIS — D72828 Other elevated white blood cell count: Secondary | ICD-10-CM | POA: Diagnosis not present

## 2021-03-12 DIAGNOSIS — R112 Nausea with vomiting, unspecified: Secondary | ICD-10-CM | POA: Diagnosis not present

## 2021-03-12 DIAGNOSIS — N289 Disorder of kidney and ureter, unspecified: Secondary | ICD-10-CM | POA: Diagnosis not present

## 2021-03-12 DIAGNOSIS — Z79899 Other long term (current) drug therapy: Secondary | ICD-10-CM | POA: Diagnosis not present

## 2021-03-12 DIAGNOSIS — R1084 Generalized abdominal pain: Secondary | ICD-10-CM | POA: Diagnosis not present

## 2021-03-13 DIAGNOSIS — R1084 Generalized abdominal pain: Secondary | ICD-10-CM | POA: Diagnosis not present

## 2021-03-13 DIAGNOSIS — Z743 Need for continuous supervision: Secondary | ICD-10-CM | POA: Diagnosis not present

## 2021-03-13 DIAGNOSIS — R29898 Other symptoms and signs involving the musculoskeletal system: Secondary | ICD-10-CM | POA: Diagnosis not present

## 2021-03-17 DIAGNOSIS — I110 Hypertensive heart disease with heart failure: Secondary | ICD-10-CM | POA: Diagnosis not present

## 2021-03-17 DIAGNOSIS — D6861 Antiphospholipid syndrome: Secondary | ICD-10-CM | POA: Diagnosis not present

## 2021-03-17 DIAGNOSIS — I5032 Chronic diastolic (congestive) heart failure: Secondary | ICD-10-CM | POA: Diagnosis not present

## 2021-03-17 DIAGNOSIS — E1142 Type 2 diabetes mellitus with diabetic polyneuropathy: Secondary | ICD-10-CM | POA: Diagnosis not present

## 2021-03-20 DIAGNOSIS — I639 Cerebral infarction, unspecified: Secondary | ICD-10-CM | POA: Diagnosis not present

## 2021-03-22 DIAGNOSIS — I6300 Cerebral infarction due to thrombosis of unspecified precerebral artery: Secondary | ICD-10-CM | POA: Diagnosis not present

## 2021-03-23 DIAGNOSIS — E119 Type 2 diabetes mellitus without complications: Secondary | ICD-10-CM | POA: Diagnosis not present

## 2021-03-23 DIAGNOSIS — I639 Cerebral infarction, unspecified: Secondary | ICD-10-CM | POA: Diagnosis not present

## 2021-03-23 DIAGNOSIS — I69353 Hemiplegia and hemiparesis following cerebral infarction affecting right non-dominant side: Secondary | ICD-10-CM | POA: Diagnosis not present

## 2021-03-26 DIAGNOSIS — I10 Essential (primary) hypertension: Secondary | ICD-10-CM | POA: Diagnosis not present

## 2021-03-26 DIAGNOSIS — N186 End stage renal disease: Secondary | ICD-10-CM | POA: Diagnosis not present

## 2021-03-26 DIAGNOSIS — E1142 Type 2 diabetes mellitus with diabetic polyneuropathy: Secondary | ICD-10-CM | POA: Diagnosis not present

## 2021-03-26 DIAGNOSIS — D6861 Antiphospholipid syndrome: Secondary | ICD-10-CM | POA: Diagnosis not present

## 2021-03-26 DIAGNOSIS — I110 Hypertensive heart disease with heart failure: Secondary | ICD-10-CM | POA: Diagnosis not present

## 2021-03-26 DIAGNOSIS — M6281 Muscle weakness (generalized): Secondary | ICD-10-CM | POA: Diagnosis not present

## 2021-03-26 DIAGNOSIS — I5032 Chronic diastolic (congestive) heart failure: Secondary | ICD-10-CM | POA: Diagnosis not present

## 2021-03-26 DIAGNOSIS — I69351 Hemiplegia and hemiparesis following cerebral infarction affecting right dominant side: Secondary | ICD-10-CM | POA: Diagnosis not present

## 2021-03-26 DIAGNOSIS — Z7901 Long term (current) use of anticoagulants: Secondary | ICD-10-CM | POA: Diagnosis not present

## 2021-03-30 DIAGNOSIS — R197 Diarrhea, unspecified: Secondary | ICD-10-CM | POA: Diagnosis not present

## 2021-04-01 DIAGNOSIS — M6281 Muscle weakness (generalized): Secondary | ICD-10-CM | POA: Diagnosis not present

## 2021-04-01 DIAGNOSIS — I110 Hypertensive heart disease with heart failure: Secondary | ICD-10-CM | POA: Diagnosis not present

## 2021-04-01 DIAGNOSIS — E1142 Type 2 diabetes mellitus with diabetic polyneuropathy: Secondary | ICD-10-CM | POA: Diagnosis not present

## 2021-04-01 DIAGNOSIS — I10 Essential (primary) hypertension: Secondary | ICD-10-CM | POA: Diagnosis not present

## 2021-04-01 DIAGNOSIS — I69351 Hemiplegia and hemiparesis following cerebral infarction affecting right dominant side: Secondary | ICD-10-CM | POA: Diagnosis not present

## 2021-04-01 DIAGNOSIS — D6861 Antiphospholipid syndrome: Secondary | ICD-10-CM | POA: Diagnosis not present

## 2021-04-01 DIAGNOSIS — I5032 Chronic diastolic (congestive) heart failure: Secondary | ICD-10-CM | POA: Diagnosis not present

## 2021-04-01 DIAGNOSIS — I6330 Cerebral infarction due to thrombosis of unspecified cerebral artery: Secondary | ICD-10-CM | POA: Diagnosis not present

## 2021-04-01 DIAGNOSIS — Z7901 Long term (current) use of anticoagulants: Secondary | ICD-10-CM | POA: Diagnosis not present

## 2021-04-01 DIAGNOSIS — N186 End stage renal disease: Secondary | ICD-10-CM | POA: Diagnosis not present

## 2021-04-01 DIAGNOSIS — M328 Other forms of systemic lupus erythematosus: Secondary | ICD-10-CM | POA: Diagnosis not present

## 2021-04-03 DIAGNOSIS — E1122 Type 2 diabetes mellitus with diabetic chronic kidney disease: Secondary | ICD-10-CM | POA: Diagnosis not present

## 2021-04-03 DIAGNOSIS — Z7901 Long term (current) use of anticoagulants: Secondary | ICD-10-CM | POA: Diagnosis not present

## 2021-04-03 DIAGNOSIS — I110 Hypertensive heart disease with heart failure: Secondary | ICD-10-CM | POA: Diagnosis not present

## 2021-04-07 DIAGNOSIS — Z7901 Long term (current) use of anticoagulants: Secondary | ICD-10-CM | POA: Diagnosis not present

## 2021-04-14 DIAGNOSIS — M79641 Pain in right hand: Secondary | ICD-10-CM | POA: Diagnosis not present

## 2021-04-14 DIAGNOSIS — M25531 Pain in right wrist: Secondary | ICD-10-CM | POA: Diagnosis not present

## 2021-04-16 DIAGNOSIS — I5032 Chronic diastolic (congestive) heart failure: Secondary | ICD-10-CM | POA: Diagnosis not present

## 2021-04-16 DIAGNOSIS — D6861 Antiphospholipid syndrome: Secondary | ICD-10-CM | POA: Diagnosis not present

## 2021-04-16 DIAGNOSIS — M79601 Pain in right arm: Secondary | ICD-10-CM | POA: Diagnosis not present

## 2021-04-16 DIAGNOSIS — I10 Essential (primary) hypertension: Secondary | ICD-10-CM | POA: Diagnosis not present

## 2021-04-16 DIAGNOSIS — M6281 Muscle weakness (generalized): Secondary | ICD-10-CM | POA: Diagnosis not present

## 2021-04-16 DIAGNOSIS — R2230 Localized swelling, mass and lump, unspecified upper limb: Secondary | ICD-10-CM | POA: Diagnosis not present

## 2021-04-16 DIAGNOSIS — I69351 Hemiplegia and hemiparesis following cerebral infarction affecting right dominant side: Secondary | ICD-10-CM | POA: Diagnosis not present

## 2021-04-16 DIAGNOSIS — I82621 Acute embolism and thrombosis of deep veins of right upper extremity: Secondary | ICD-10-CM | POA: Diagnosis not present

## 2021-04-16 DIAGNOSIS — E1142 Type 2 diabetes mellitus with diabetic polyneuropathy: Secondary | ICD-10-CM | POA: Diagnosis not present

## 2021-04-16 DIAGNOSIS — I110 Hypertensive heart disease with heart failure: Secondary | ICD-10-CM | POA: Diagnosis not present

## 2021-04-16 DIAGNOSIS — R1312 Dysphagia, oropharyngeal phase: Secondary | ICD-10-CM | POA: Diagnosis not present

## 2021-04-16 DIAGNOSIS — M328 Other forms of systemic lupus erythematosus: Secondary | ICD-10-CM | POA: Diagnosis not present

## 2021-04-16 DIAGNOSIS — Z7901 Long term (current) use of anticoagulants: Secondary | ICD-10-CM | POA: Diagnosis not present

## 2021-04-16 DIAGNOSIS — M79621 Pain in right upper arm: Secondary | ICD-10-CM | POA: Diagnosis not present

## 2021-04-16 DIAGNOSIS — N186 End stage renal disease: Secondary | ICD-10-CM | POA: Diagnosis not present

## 2021-04-23 DIAGNOSIS — Z7901 Long term (current) use of anticoagulants: Secondary | ICD-10-CM | POA: Diagnosis not present

## 2021-04-25 DIAGNOSIS — Z7901 Long term (current) use of anticoagulants: Secondary | ICD-10-CM | POA: Diagnosis not present

## 2021-04-28 DIAGNOSIS — Z7901 Long term (current) use of anticoagulants: Secondary | ICD-10-CM | POA: Diagnosis not present

## 2021-04-30 DIAGNOSIS — L89213 Pressure ulcer of right hip, stage 3: Secondary | ICD-10-CM | POA: Diagnosis not present

## 2021-04-30 DIAGNOSIS — L89314 Pressure ulcer of right buttock, stage 4: Secondary | ICD-10-CM | POA: Diagnosis not present

## 2021-04-30 DIAGNOSIS — L89010 Pressure ulcer of right elbow, unstageable: Secondary | ICD-10-CM | POA: Diagnosis not present

## 2021-05-01 DIAGNOSIS — E1142 Type 2 diabetes mellitus with diabetic polyneuropathy: Secondary | ICD-10-CM | POA: Diagnosis not present

## 2021-05-01 DIAGNOSIS — I69351 Hemiplegia and hemiparesis following cerebral infarction affecting right dominant side: Secondary | ICD-10-CM | POA: Diagnosis not present

## 2021-05-01 DIAGNOSIS — M328 Other forms of systemic lupus erythematosus: Secondary | ICD-10-CM | POA: Diagnosis not present

## 2021-05-01 DIAGNOSIS — D6861 Antiphospholipid syndrome: Secondary | ICD-10-CM | POA: Diagnosis not present

## 2021-05-01 DIAGNOSIS — N186 End stage renal disease: Secondary | ICD-10-CM | POA: Diagnosis not present

## 2021-05-01 DIAGNOSIS — I5032 Chronic diastolic (congestive) heart failure: Secondary | ICD-10-CM | POA: Diagnosis not present

## 2021-05-01 DIAGNOSIS — M79601 Pain in right arm: Secondary | ICD-10-CM | POA: Diagnosis not present

## 2021-05-01 DIAGNOSIS — I6330 Cerebral infarction due to thrombosis of unspecified cerebral artery: Secondary | ICD-10-CM | POA: Diagnosis not present

## 2021-05-01 DIAGNOSIS — M6281 Muscle weakness (generalized): Secondary | ICD-10-CM | POA: Diagnosis not present

## 2021-05-01 DIAGNOSIS — I110 Hypertensive heart disease with heart failure: Secondary | ICD-10-CM | POA: Diagnosis not present

## 2021-05-04 DIAGNOSIS — I639 Cerebral infarction, unspecified: Secondary | ICD-10-CM | POA: Diagnosis not present

## 2021-05-05 DIAGNOSIS — R059 Cough, unspecified: Secondary | ICD-10-CM | POA: Diagnosis not present

## 2021-05-05 DIAGNOSIS — D72829 Elevated white blood cell count, unspecified: Secondary | ICD-10-CM | POA: Diagnosis not present

## 2021-05-06 DIAGNOSIS — D72829 Elevated white blood cell count, unspecified: Secondary | ICD-10-CM | POA: Diagnosis not present

## 2021-05-06 DIAGNOSIS — R509 Fever, unspecified: Secondary | ICD-10-CM | POA: Diagnosis not present

## 2021-05-06 DIAGNOSIS — N390 Urinary tract infection, site not specified: Secondary | ICD-10-CM | POA: Diagnosis not present

## 2021-05-06 DIAGNOSIS — I110 Hypertensive heart disease with heart failure: Secondary | ICD-10-CM | POA: Diagnosis not present

## 2021-05-06 DIAGNOSIS — E1122 Type 2 diabetes mellitus with diabetic chronic kidney disease: Secondary | ICD-10-CM | POA: Diagnosis not present

## 2021-05-06 DIAGNOSIS — R109 Unspecified abdominal pain: Secondary | ICD-10-CM | POA: Diagnosis not present

## 2021-05-07 DIAGNOSIS — L89010 Pressure ulcer of right elbow, unstageable: Secondary | ICD-10-CM | POA: Diagnosis not present

## 2021-05-07 DIAGNOSIS — L89213 Pressure ulcer of right hip, stage 3: Secondary | ICD-10-CM | POA: Diagnosis not present

## 2021-05-07 DIAGNOSIS — L89626 Pressure-induced deep tissue damage of left heel: Secondary | ICD-10-CM | POA: Diagnosis not present

## 2021-05-07 DIAGNOSIS — L89314 Pressure ulcer of right buttock, stage 4: Secondary | ICD-10-CM | POA: Diagnosis not present

## 2021-05-08 DIAGNOSIS — L89152 Pressure ulcer of sacral region, stage 2: Secondary | ICD-10-CM | POA: Diagnosis not present

## 2021-05-11 DIAGNOSIS — I639 Cerebral infarction, unspecified: Secondary | ICD-10-CM | POA: Diagnosis not present

## 2021-05-13 ENCOUNTER — Non-Acute Institutional Stay: Payer: Medicare Other | Admitting: Student

## 2021-05-13 ENCOUNTER — Other Ambulatory Visit: Payer: Self-pay

## 2021-05-13 DIAGNOSIS — L89313 Pressure ulcer of right buttock, stage 3: Secondary | ICD-10-CM | POA: Diagnosis not present

## 2021-05-13 DIAGNOSIS — F32A Depression, unspecified: Secondary | ICD-10-CM

## 2021-05-13 DIAGNOSIS — L89314 Pressure ulcer of right buttock, stage 4: Secondary | ICD-10-CM

## 2021-05-13 DIAGNOSIS — I69359 Hemiplegia and hemiparesis following cerebral infarction affecting unspecified side: Secondary | ICD-10-CM

## 2021-05-13 DIAGNOSIS — L89010 Pressure ulcer of right elbow, unstageable: Secondary | ICD-10-CM | POA: Diagnosis not present

## 2021-05-13 DIAGNOSIS — Z515 Encounter for palliative care: Secondary | ICD-10-CM | POA: Diagnosis not present

## 2021-05-13 DIAGNOSIS — R52 Pain, unspecified: Secondary | ICD-10-CM

## 2021-05-13 DIAGNOSIS — R630 Anorexia: Secondary | ICD-10-CM

## 2021-05-14 DIAGNOSIS — L89213 Pressure ulcer of right hip, stage 3: Secondary | ICD-10-CM | POA: Diagnosis not present

## 2021-05-14 DIAGNOSIS — L89314 Pressure ulcer of right buttock, stage 4: Secondary | ICD-10-CM | POA: Diagnosis not present

## 2021-05-14 DIAGNOSIS — I509 Heart failure, unspecified: Secondary | ICD-10-CM | POA: Diagnosis not present

## 2021-05-14 DIAGNOSIS — M6281 Muscle weakness (generalized): Secondary | ICD-10-CM | POA: Diagnosis not present

## 2021-05-14 DIAGNOSIS — L988 Other specified disorders of the skin and subcutaneous tissue: Secondary | ICD-10-CM | POA: Diagnosis not present

## 2021-05-14 DIAGNOSIS — I110 Hypertensive heart disease with heart failure: Secondary | ICD-10-CM | POA: Diagnosis not present

## 2021-05-14 DIAGNOSIS — I69351 Hemiplegia and hemiparesis following cerebral infarction affecting right dominant side: Secondary | ICD-10-CM | POA: Diagnosis not present

## 2021-05-14 DIAGNOSIS — L89010 Pressure ulcer of right elbow, unstageable: Secondary | ICD-10-CM | POA: Diagnosis not present

## 2021-05-14 DIAGNOSIS — E1142 Type 2 diabetes mellitus with diabetic polyneuropathy: Secondary | ICD-10-CM | POA: Diagnosis not present

## 2021-05-14 DIAGNOSIS — N186 End stage renal disease: Secondary | ICD-10-CM | POA: Diagnosis not present

## 2021-05-14 DIAGNOSIS — I5032 Chronic diastolic (congestive) heart failure: Secondary | ICD-10-CM | POA: Diagnosis not present

## 2021-05-14 NOTE — Progress Notes (Signed)
[facility] Palliative Care Consult Note Telephone: [phone]  Fax: [phone]   Date of encounter: 05/13/2021 2:21 PM PATIENT NAME: Connie Ruiz [place] [id number]   [phone] (home)  DOB: 06-26-1960 MRN: [id number] PRIMARY CARE PROVIDER:    Dr. Lincoln Brigham  REFERRING PROVIDER:   Vilinda Boehringer, NP  RESPONSIBLE PARTY:    Contact Information     Name Relation Home Work Mobile   Camps,[name] Daughter [phone]     Rica Koyanagi   [phone]        I met face to face with patient in the facility. Palliative Care was asked to follow this patient by consultation request of Vilinda Boehringer, NP to address advance care planning and complex medical decision making. This is the initial visit.   Left Message for daughter Connie Ruiz; awaiting return call.                                    ASSESSMENT AND PLAN / RECOMMENDATIONS:   Advance Care Planning/Goals of Care: Goals include to maximize quality of life and symptom management. Patient/health care surrogate gave his/her permission to discuss.Our advance care planning conversation included a discussion about:    The value and importance of advance care planning  Experiences with loved ones who have been seriously ill or have died  Exploration of personal, cultural or spiritual beliefs that might influence medical decisions  Exploration of goals of care in the event of a sudden injury or illness  Identification and preparation of a healthcare agent  CODE STATUS: Full Code  Explained Palliative Medicine visit. Patient is a Full Code per facility. Will review GOC, code status with daughter when she returns call. Palliative Medicine will continue to provide ongoing support.   Symptom Management/Plan:  Right sided hemiparesis s/p CVA-patient is dependent for adl's. Staff to continue assisting with all adl's. Monitor for further functional and  cognitive declines.   Decline in appetite-continue Regular, LCS diet. Recommend nutritional supplement BID, offer foods patient enjoys. Routine weights.   Pain-patient with chronic pain, multiple wounds. Continue morphine IR every 4 hours PRN. Patient is made aware that she will need to ask for medication. Did discuss with patient and staff scheduling routinely if she is needing more frequently.   Depression-patient's sertraline restarted at 25 mg per psych, will titrate up to 50 mg after 14 days; mirtazapine 30 mg QHS. Continue to be followed by psychiatry.   Wounds-patient with stage 3 wound to right ischium, stage 4 to right buttocks, unstageable wound to right elbow. Continue wound care per facility staff, follow up with wound provider as scheduled. Continue ProStat BID. Air mattress to bed. Patient encouraged to be out of bed as able to tolerate for short periods.   Follow up Palliative Care Visit: Palliative care will continue to follow for complex medical decision making, advance care planning, and clarification of goals. Return 4-6 weeks or prn.  I spent 35 minutes providing this consultation. More than 50% of the time in this consultation was spent in counseling and care coordination.   PPS: 30%  HOSPICE ELIGIBILITY/DIAGNOSIS: TBD  Chief Complaint: Palliative medicine initial consult.   HISTORY OF PRESENT ILLNESS:  Connie Ruiz is a 60 y.o. year old female  with ESRD s/p renal transplant 2005, right sided hemiparesis/p CVA, chronic diastolic HF, hypertensive heart disease, bipolar, depression, anxiety, hyperlipidemia, T2DM, hypothyroidism,  chronic pain.  Patient resides at [facility]. She has been depressed recently due to her roommate passing away. She has had a decline in her appetite; appetite varies from 0-75% of meals. Does eat some foods she enjoys. She sometimes refuses her medications. She has not been getting out of bed. Previously required assist x 1-2 for transfers.  Patient with multiple wounds to buttocks, ischium and right elbow. She is currently being treated for a UTI. Patient endorses generalized pain. She is receiving morphine IR PRN and ask that dosage be increased. She takes each morning, otherwise does not usually ask for medication. She is sleeping well. Patient was seen by psychiatry and her sertraline and mirtazapine were restarted in past two days.   Patient received resting in bed. She does answer direct questions. Some difficulty with word finding. She does become agitated during visit. Staff made aware of request for pain medication.   History obtained from review of EMR, discussion with primary team, and interview with family, facility staff/caregiver and/or [name].  I reviewed available labs, medications, imaging, studies and related documents from the EMR.  Records reviewed and summarized above.   ROS General: NAD EYES: denies vision changes ENMT: denies dysphagia Cardiovascular: denies chest pain, denies DOE Pulmonary: denies cough, denies increased SOB Abdomen: denies constipation, endorses incontinence of bowel GU: denies dysuria MSK: +weakness,  no falls reported Skin: wounds to buttocks Neurological: endorses pain, denies insomnia Psych: Endorses depressed mood Heme/lymph/immuno: denies bruises, abnormal bleeding  Physical Exam:  Weight: 187.8 pounds 04/2021, 203.6 pounds 01/2021. Pulse 72, resp 16 even/unlabored, sats 98% on room air Constitutional: NAD General: frail appearing  EYES: anicteric sclera, lids intact, no discharge  ENMT: intact hearing, oral mucous membranes dry, dentition intact CV: S1S2, RRR Pulmonary: LCTA, no increased work of breathing, no cough, room air Abdomen: normo-active BS + 4 quadrants, soft and non tender, no ascites GU: deferred MSK: non-ambulatory Skin: warm and dry, wounds to buttocks, ischium not visualized Neuro: generalized weakness, A & O x 2 Psych: non-anxious affect,  agitated Hem/lymph/immuno: no widespread bruising CURRENT PROBLEM LIST:  Patient Active Problem List   Diagnosis Date Noted   Enteritis 01/27/2021   Hypothyroidism, 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   Hypothyroidism 01/14/2018   HLD (hyperlipidemia) 01/14/2018   CVA (cerebral vascular accident) ([place]) 01/12/2018   Chest pain 01/11/2018   Acute respiratory failure with hypoxia ([place]) 07/20/2017   Abdominal bloating 07/20/2017   Sinus tachycardia 07/20/2017   SLE (systemic lupus erythematosus related syndrome) ([place]) 07/20/2017   CAP (community acquired pneumonia) 07/16/2017   Screening examination for infectious disease 01/28/2016   Type 2 diabetes mellitus ([place]) 08/03/2015   Urinary tract infection 07/20/2015   Vascular dementia ([place]) 05/08/2015   Hemiparesis as late effect of cerebrovascular accident (CVA) ([place]) 05/08/2015   Aphasia as late effect of stroke 05/08/2015   C. difficile colitis 02/25/2015   Hypokalemia 02/25/2015   CKD (chronic kidney disease) 02/25/2015   Colitis 02/14/2015   Sepsis ([place]) 02/14/2015   Nausea vomiting and diarrhea 02/14/2015   Abdominal pain 02/14/2015   UTI (lower urinary tract infection) 02/14/2015   Abdominal pain, chronic, generalized 01/07/2015   Candida esophagitis ([place]) 11/12/2014   Abnormal CT of the abdomen    Steroid-induced hyperglycemia 11/09/2014   Hypomagnesemia 11/06/2014   Acute abdominal pain    Supratherapeutic INR 11/02/2014   Jejunitis 11/02/2014   Myalgia and myositis 04/05/2014   Wellness examination 04/05/2014   Menopausal state 04/05/2014   Palpitations 08/31/2013   Hyperlipidemia  08/31/2013   Disorder of heart rhythm 08/13/2013   TIA (transient ischemic attack) 06/20/2013   Gout flare: R elbow and R shoulder 06/20/2013   Acute encephalopathy 06/14/2013   Rhabdomyolysis 06/14/2013   History of stroke with residual effects    Right sided weakness    Breast mass, left 04/30/2013   Contusion of knee, left 10/18/2012    Depression 10/05/2012   Encounter for long-term (current) use of other medications 10/04/2012   Multiple thyroid nodules 06/08/2012   Risk for falls 05/05/2012   Physical deconditioning 05/05/2012   Fever 04/27/2012   DVT (deep venous thrombosis) ([place]) 06/17/2011   Expressive aphasia 06/17/2011   Dysphasia 06/11/2011   Incontinence of urine 06/11/2011   Hand pain, left 06/11/2011   Immunosuppressive management encounter following kidney transplant 2011-06-18   Hypertension goal BP (blood pressure) < 130/80 06-18-2011   Deceased-donor kidney transplant recipient 06-18-2011   Arm pain, left 05/07/2011   Dysfunctional voiding of urine 04/28/2011   Urge incontinence 04/28/2011   Urinary urgency 04/28/2011   Routine general medical examination at a health care facility 04/13/2011   Anticoagulated 01/08/2011   Pulmonary embolism ([place]) 07/16/2010   Cerebral artery occlusion with cerebral infarction ([place]) 04/17/2010   CEREBROVASCULAR ACCIDENT, ACUTE 04/15/2010   THYROID NODULE, LEFT 04/10/2009   Cough 03/26/2009   ACUTE BRONCHITIS 08/22/2007   KIDNEY TRANSPLANTATION, HX OF 08/22/2007   Gout 08/21/2007   Essential hypertension 08/21/2007   Chronic diastolic congestive heart failure ([place]) 08/21/2007   GERD 08/21/2007   Disorder resulting from impaired renal function 08/21/2007   LUPUS 08/21/2007   Osteoporosis 08/21/2007   DVT, HX OF 08/21/2007   Enteritis due to Clostridium difficile 08/21/2007   PAST MEDICAL HISTORY:  Active Ambulatory Problems    Diagnosis Date Noted   THYROID NODULE, LEFT 04/10/2009   Gout 08/21/2007   Essential hypertension 08/21/2007   Chronic diastolic congestive heart failure ([place]) 08/21/2007   Cerebral artery occlusion with cerebral infarction ([place]) 04/17/2010   CEREBROVASCULAR ACCIDENT, ACUTE 04/15/2010   ACUTE BRONCHITIS 08/22/2007   GERD 08/21/2007   Disorder resulting from impaired renal function 08/21/2007   LUPUS 08/21/2007   Osteoporosis  08/21/2007   Cough 03/26/2009   DVT, HX OF 08/21/2007   Enteritis due to Clostridium difficile 08/21/2007   KIDNEY TRANSPLANTATION, HX OF 08/22/2007   Pulmonary embolism ([place]) 07/16/2010   Anticoagulated 01/08/2011   Routine general medical examination at a health care facility 04/13/2011   Arm pain, left 05/07/2011   Dysphasia 06/11/2011   Incontinence of urine 06/11/2011   Hand pain, left 06/11/2011   DVT (deep venous thrombosis) ([place]) 06/17/2011   Expressive aphasia 06/17/2011   Encounter for long-term (current) use of other medications 10/04/2012   Depression 10/05/2012   Contusion of knee, left 10/18/2012   Breast mass, left 04/30/2013   History of stroke with residual effects    Right sided weakness    Acute encephalopathy 06/14/2013   Rhabdomyolysis 06/14/2013   TIA (transient ischemic attack) 06/20/2013   Gout flare: R elbow and R shoulder 06/20/2013   Disorder of heart rhythm 08/13/2013   Palpitations 08/31/2013   Hyperlipidemia 08/31/2013   Myalgia and myositis 04/05/2014   Wellness examination 04/05/2014   Menopausal state 04/05/2014   Supratherapeutic INR 11/02/2014   Jejunitis 11/02/2014   Hypomagnesemia 11/06/2014   Acute abdominal pain    Steroid-induced hyperglycemia 11/09/2014   Abnormal CT of the abdomen    Candida esophagitis ([place]) 11/12/2014   Colitis 02/14/2015   Sepsis ([place])  02/14/2015   Nausea vomiting and diarrhea 02/14/2015   Abdominal pain 02/14/2015   UTI (lower urinary tract infection) 02/14/2015   C. difficile colitis 02/25/2015   Hypokalemia 02/25/2015   CKD (chronic kidney disease) 02/25/2015   Vascular dementia ([place]) 05/08/2015   Hemiparesis as late effect of cerebrovascular accident (CVA) ([place]) 05/08/2015   Aphasia as late effect of stroke 05/08/2015   Type 2 diabetes mellitus ([place]) 08/03/2015   Screening examination for infectious disease 01/28/2016   CAP (community acquired pneumonia) 07/16/2017   Acute respiratory failure with  hypoxia ([place]) 07/20/2017   Abdominal bloating 07/20/2017   Sinus tachycardia 07/20/2017   SLE (systemic lupus erythematosus related syndrome) ([place]) 07/20/2017   Hypothyroidism, 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   Dysfunctional voiding of urine 04/28/2011   Fever 04/27/2012   Hypothyroidism 01/14/2018   Immunosuppressive management encounter following kidney transplant 06-12-11   Multiple thyroid nodules 06/08/2012   Risk for falls 05/05/2012   Urge incontinence 04/28/2011   Urinary urgency 04/28/2011   Abdominal pain, chronic, generalized 01/07/2015   Chest pain 01/11/2018   Physical deconditioning 05/05/2012   CVA (cerebral vascular accident) ([place]) 01/12/2018   Hypertension goal BP (blood pressure) < 130/80 06-12-11   Deceased-donor kidney transplant recipient 12-Jun-2011   HLD (hyperlipidemia) 01/14/2018   Urinary tract infection 07/20/2015   Enteritis 01/27/2021   Resolved Ambulatory Problems    Diagnosis Date Noted   Abdominal pain    Long term (current) use of anticoagulants [Z79.01] 06/21/2017   Past Medical History:  Diagnosis Date   Anxiety    CLOSTRIDIUM DIFFICILE COLITIS, HX OF 08/21/2007   CONGESTIVE HEART FAILURE 08/21/2007   Current use of long term anticoagulation    CVA 04/17/2010   DIABETES MELLITUS, TYPE II 08/21/2007   GOUT 08/21/2007   HYPERLIPIDEMIA 08/21/2007   HYPERTENSION 08/21/2007   OSTEOPOROSIS 08/21/2007   Renal failure    RENAL INSUFFICIENCY 08/21/2007   Tachycardia    SOCIAL HX:  Social History   Tobacco Use   Smoking status: Never   Smokeless tobacco: Never  Substance Use Topics   Alcohol use: No    Alcohol/week: 0.0 standard drinks   FAMILY HX:  Family History  Problem Relation Age of Onset   Heart attack Mother    Heart disease Father    Asthma Sister    Asthma Daughter    Cancer Maternal Grandfather        prostate   Cancer Paternal Grandfather        colon      ALLERGIES:  Allergies  Allergen Reactions   Oxycodone-Acetaminophen  Shortness Of Breath and Nausea Only   Propoxyphene Nausea Only and Shortness Of Breath    States takes tylenol at home   Propoxyphene N-Acetaminophen Shortness Of Breath and Nausea Only   Sulfonamide Derivatives Shortness Of Breath and Nausea Only   Codeine Nausea Only   Hydrocodone-Acetaminophen    Hydromorphone    Other Other (See Comments)    No blood, Jehovaeh Witness    Oxycodone    Sulfamethoxazole    Tape     Redness**PAPER TAPE OK**   Gabapentin Anxiety    twitching   Latex Rash   Metoprolol Rash   Morphine And Related Rash    IV site on arm is red, patient reports this is improving.  NO shortness of breath reported.   Rosiglitazone Rash     PERTINENT MEDICATIONS:  Outpatient Encounter Medications as of 05/13/2021  Medication Sig   acetaminophen (TYLENOL) 500 MG tablet Take 1,000  mg by mouth 4 (four) times daily as needed for mild pain or moderate pain.   alendronate (FOSAMAX) 70 MG tablet TAKE 1 TABLET BY MOUTH ONCE A WEEK ON AN EMPTY STOMACH ON SATURDAYS WITH A FULL GLASS OF WATER. (Patient taking differently: Take 70 mg by mouth once a week.)   Blood Glucose Monitoring Suppl (ACCU-CHEK AVIVA PLUS) w/Device KIT 1 Device by Does not apply route daily.   calcitRIOL (ROCALTROL) 0.25 MCG capsule TAKE 1 CAPSULE(0.25 MCG) BY MOUTH DAILY. NO REFILLS WITHOUT APPOINTMENT   carvedilol (COREG) 6.25 MG tablet Take 1 tablet (6.25 mg total) by mouth 2 (two) times daily with a meal.   cholecalciferol (VITAMIN D3) 25 MCG (1000 UNIT) tablet Take 1,000 Units by mouth daily.   dicyclomine (BENTYL) 10 MG capsule Take 1 capsule (10 mg total) by mouth 3 (three) times daily before meals.   diltiazem (TIAZAC) 360 MG 24 hr capsule Take 1 capsule (360 mg total) by mouth daily.   donepezil (ARICEPT) 10 MG tablet Take 10 mg by mouth at bedtime.    esomeprazole (NEXIUM) 20 MG capsule TAKE 1 CAPSULE(20 MG) BY MOUTH DAILY (Patient taking differently: Take 20 mg by mouth daily at 12 noon.)   famotidine  (PEPCID) 20 MG tablet Take 20 mg by mouth 2 (two) times daily.   feeding supplement, ENSURE ENLIVE, (ENSURE ENLIVE) LIQD Take 237 mLs by mouth 2 (two) times daily between meals. (Patient not taking: Reported on 02/16/2021)   furosemide (LASIX) 20 MG tablet Take 20 mg by mouth daily.   glucose blood (ONE TOUCH ULTRA TEST) test strip Use to check blood sugar 1 time per day   insulin lispro (HUMALOG) 100 UNIT/ML injection Inject 0-12 Units into the skin 3 (three) times daily before meals. Per Sliding Scale. If blood sugar is: 0-150: 0 units 151-200: 2 units 201-250: 4 units 251-300: 6 units 301-350: 8 units 351-400: 10 units 401-450: 12 units (Patient not taking: Reported on 02/16/2021)   levothyroxine (SYNTHROID) 150 MCG tablet Take 1 tablet (150 mcg total) by mouth daily at 6 (six) AM.   loperamide (IMODIUM) 2 MG capsule Take 1 capsule (2 mg total) by mouth every 6 (six) hours as needed for diarrhea or loose stools.   magnesium oxide (MAG-OX) 400 MG tablet Take 800 mg by mouth daily.    metFORMIN (GLUCOPHAGE-XR) 500 MG 24 hr tablet TAKE 1 TABLET(500 MG) BY MOUTH DAILY WITH BREAKFAST (Patient taking differently: Take 500 mg by mouth daily with breakfast.)   mirtazapine (REMERON) 30 MG tablet Take 1 tablet (30 mg total) by mouth at bedtime.   multivitamin (RENA-VIT) TABS tablet Take 1 tablet by mouth daily.   mycophenolate (CELLCEPT) 250 MG capsule Take 250 mg by mouth 2 (two) times daily.    NONFORMULARY OR COMPOUNDED ITEM [facility]:  Pain Cream - Ketamine 5%, Baclofen 2%, Gabapentin 5%, Lidocaine 5%, Menthol 1%, apply 1-2 grams to affected area 3-4 times a day for pain.   nystatin (MYCOSTATIN) powder Apply 1 g topically 4 (four) times daily as needed. For yeast under breast   ondansetron (ZOFRAN ODT) 4 MG disintegrating tablet 41m ODT q4 hours prn nausea/vomiting (Patient taking differently: Take 4 mg by mouth every 4 (four) hours as needed for nausea or vomiting.)   ONETOUCH DELICA  LANCETS 334LMISC Use to check blood sugar 1 time per day   Potassium Chloride ER 20 MEQ TBCR Take 1 tablet by mouth daily. NEEDS POTASSIUM RE-CHECK.   predniSONE (DELTASONE) 5 MG tablet  Take 5 mg by mouth daily with breakfast.    psyllium (METAMUCIL) 58.6 % packet Take 1 packet by mouth daily.   Skin Protectants, Misc. (EUCERIN) cream Apply 1 application topically daily. To feet   tacrolimus (PROGRAF) 1 MG capsule Take 2.5 mg by mouth every 12 (twelve) hours.   warfarin (COUMADIN) 2 MG tablet Take 2 mg by mouth daily.   warfarin (COUMADIN) 5 MG tablet TAKE 1/2 TO 1 TABLET BY MOUTH ON MONDAY THROUGH FRIDAY; TUESDAY, WEDNESDAY, THURSDAY, SATAND SUNDAY-2.5MG (Patient not taking: Reported on 02/16/2021)   No facility-administered encounter medications on file as of 05/13/2021.   Thank you for the opportunity to participate in the care of Ms. Downie.  The palliative care team will continue to follow. Please call our office at [phone] if we can be of additional assistance.   Ezekiel Slocumb, NP ,   COVID-19 PATIENT SCREENING TOOL Asked and negative response unless otherwise noted:  Have you had symptoms of covid, tested positive or been in contact with someone with symptoms/positive test in the past 5-10 days? No

## 2021-05-15 DIAGNOSIS — N390 Urinary tract infection, site not specified: Secondary | ICD-10-CM | POA: Diagnosis not present

## 2021-05-21 DIAGNOSIS — L89010 Pressure ulcer of right elbow, unstageable: Secondary | ICD-10-CM | POA: Diagnosis not present

## 2021-05-21 DIAGNOSIS — L89314 Pressure ulcer of right buttock, stage 4: Secondary | ICD-10-CM | POA: Diagnosis not present

## 2021-05-21 DIAGNOSIS — L89213 Pressure ulcer of right hip, stage 3: Secondary | ICD-10-CM | POA: Diagnosis not present

## 2021-05-26 ENCOUNTER — Encounter (HOSPITAL_COMMUNITY): Payer: Self-pay

## 2021-05-26 ENCOUNTER — Other Ambulatory Visit: Payer: Self-pay

## 2021-05-26 ENCOUNTER — Inpatient Hospital Stay (HOSPITAL_COMMUNITY)
Admission: EM | Admit: 2021-05-26 | Discharge: 2021-05-29 | DRG: 699 | Disposition: A | Discharge status: Skilled Nursing Facility | Payer: Medicare Other | Source: Skilled Nursing Facility | Attending: Internal Medicine | Admitting: Internal Medicine

## 2021-05-26 DIAGNOSIS — Z7401 Bed confinement status: Secondary | ICD-10-CM | POA: Diagnosis not present

## 2021-05-26 DIAGNOSIS — L89314 Pressure ulcer of right buttock, stage 4: Secondary | ICD-10-CM | POA: Diagnosis not present

## 2021-05-26 DIAGNOSIS — Z79890 Hormone replacement therapy (postmenopausal): Secondary | ICD-10-CM

## 2021-05-26 DIAGNOSIS — R58 Hemorrhage, not elsewhere classified: Secondary | ICD-10-CM | POA: Diagnosis not present

## 2021-05-26 DIAGNOSIS — K625 Hemorrhage of anus and rectum: Secondary | ICD-10-CM

## 2021-05-26 DIAGNOSIS — L89893 Pressure ulcer of other site, stage 3: Secondary | ICD-10-CM | POA: Diagnosis not present

## 2021-05-26 DIAGNOSIS — F0150 Vascular dementia without behavioral disturbance: Secondary | ICD-10-CM | POA: Diagnosis present

## 2021-05-26 DIAGNOSIS — I10 Essential (primary) hypertension: Secondary | ICD-10-CM | POA: Diagnosis present

## 2021-05-26 DIAGNOSIS — Z91048 Other nonmedicinal substance allergy status: Secondary | ICD-10-CM | POA: Diagnosis not present

## 2021-05-26 DIAGNOSIS — T8619 Other complication of kidney transplant: Secondary | ICD-10-CM | POA: Diagnosis not present

## 2021-05-26 DIAGNOSIS — Z7901 Long term (current) use of anticoagulants: Secondary | ICD-10-CM | POA: Diagnosis not present

## 2021-05-26 DIAGNOSIS — Z888 Allergy status to other drugs, medicaments and biological substances status: Secondary | ICD-10-CM | POA: Diagnosis not present

## 2021-05-26 DIAGNOSIS — Y830 Surgical operation with transplant of whole organ as the cause of abnormal reaction of the patient, or of later complication, without mention of misadventure at the time of the procedure: Secondary | ICD-10-CM | POA: Diagnosis present

## 2021-05-26 DIAGNOSIS — E119 Type 2 diabetes mellitus without complications: Secondary | ICD-10-CM | POA: Diagnosis present

## 2021-05-26 DIAGNOSIS — M810 Age-related osteoporosis without current pathological fracture: Secondary | ICD-10-CM | POA: Diagnosis present

## 2021-05-26 DIAGNOSIS — Z881 Allergy status to other antibiotic agents status: Secondary | ICD-10-CM

## 2021-05-26 DIAGNOSIS — I69351 Hemiplegia and hemiparesis following cerebral infarction affecting right dominant side: Secondary | ICD-10-CM

## 2021-05-26 DIAGNOSIS — R4701 Aphasia: Secondary | ICD-10-CM | POA: Diagnosis present

## 2021-05-26 DIAGNOSIS — Z79899 Other long term (current) drug therapy: Secondary | ICD-10-CM

## 2021-05-26 DIAGNOSIS — R000 Tachycardia, unspecified: Secondary | ICD-10-CM | POA: Diagnosis not present

## 2021-05-26 DIAGNOSIS — D6832 Hemorrhagic disorder due to extrinsic circulating anticoagulants: Secondary | ICD-10-CM | POA: Diagnosis present

## 2021-05-26 DIAGNOSIS — Z86711 Personal history of pulmonary embolism: Secondary | ICD-10-CM

## 2021-05-26 DIAGNOSIS — N390 Urinary tract infection, site not specified: Secondary | ICD-10-CM | POA: Diagnosis not present

## 2021-05-26 DIAGNOSIS — Z8042 Family history of malignant neoplasm of prostate: Secondary | ICD-10-CM

## 2021-05-26 DIAGNOSIS — M329 Systemic lupus erythematosus, unspecified: Secondary | ICD-10-CM | POA: Diagnosis not present

## 2021-05-26 DIAGNOSIS — Z794 Long term (current) use of insulin: Secondary | ICD-10-CM

## 2021-05-26 DIAGNOSIS — N179 Acute kidney failure, unspecified: Secondary | ICD-10-CM | POA: Diagnosis not present

## 2021-05-26 DIAGNOSIS — K922 Gastrointestinal hemorrhage, unspecified: Secondary | ICD-10-CM | POA: Diagnosis not present

## 2021-05-26 DIAGNOSIS — R7989 Other specified abnormal findings of blood chemistry: Secondary | ICD-10-CM | POA: Diagnosis not present

## 2021-05-26 DIAGNOSIS — Z9049 Acquired absence of other specified parts of digestive tract: Secondary | ICD-10-CM

## 2021-05-26 DIAGNOSIS — D638 Anemia in other chronic diseases classified elsewhere: Secondary | ICD-10-CM | POA: Diagnosis present

## 2021-05-26 DIAGNOSIS — Z7983 Long term (current) use of bisphosphonates: Secondary | ICD-10-CM

## 2021-05-26 DIAGNOSIS — I69320 Aphasia following cerebral infarction: Secondary | ICD-10-CM

## 2021-05-26 DIAGNOSIS — E039 Hypothyroidism, unspecified: Secondary | ICD-10-CM | POA: Diagnosis not present

## 2021-05-26 DIAGNOSIS — K921 Melena: Secondary | ICD-10-CM | POA: Diagnosis not present

## 2021-05-26 DIAGNOSIS — E785 Hyperlipidemia, unspecified: Secondary | ICD-10-CM | POA: Diagnosis not present

## 2021-05-26 DIAGNOSIS — Z885 Allergy status to narcotic agent status: Secondary | ICD-10-CM | POA: Diagnosis not present

## 2021-05-26 DIAGNOSIS — Z86718 Personal history of other venous thrombosis and embolism: Secondary | ICD-10-CM

## 2021-05-26 DIAGNOSIS — R109 Unspecified abdominal pain: Secondary | ICD-10-CM | POA: Diagnosis not present

## 2021-05-26 DIAGNOSIS — Z743 Need for continuous supervision: Secondary | ICD-10-CM | POA: Diagnosis not present

## 2021-05-26 DIAGNOSIS — Z825 Family history of asthma and other chronic lower respiratory diseases: Secondary | ICD-10-CM

## 2021-05-26 DIAGNOSIS — L89213 Pressure ulcer of right hip, stage 3: Secondary | ICD-10-CM | POA: Diagnosis not present

## 2021-05-26 DIAGNOSIS — R791 Abnormal coagulation profile: Secondary | ICD-10-CM

## 2021-05-26 DIAGNOSIS — K529 Noninfective gastroenteritis and colitis, unspecified: Secondary | ICD-10-CM | POA: Diagnosis not present

## 2021-05-26 DIAGNOSIS — Z800 Family history of malignant neoplasm of digestive organs: Secondary | ICD-10-CM

## 2021-05-26 DIAGNOSIS — Z20822 Contact with and (suspected) exposure to covid-19: Secondary | ICD-10-CM | POA: Diagnosis present

## 2021-05-26 DIAGNOSIS — Z882 Allergy status to sulfonamides status: Secondary | ICD-10-CM

## 2021-05-26 DIAGNOSIS — Z91040 Latex allergy status: Secondary | ICD-10-CM | POA: Diagnosis not present

## 2021-05-26 DIAGNOSIS — T45515A Adverse effect of anticoagulants, initial encounter: Secondary | ICD-10-CM | POA: Diagnosis present

## 2021-05-26 DIAGNOSIS — L89010 Pressure ulcer of right elbow, unstageable: Secondary | ICD-10-CM | POA: Diagnosis not present

## 2021-05-26 DIAGNOSIS — M2550 Pain in unspecified joint: Secondary | ICD-10-CM | POA: Diagnosis not present

## 2021-05-26 DIAGNOSIS — Z940 Kidney transplant status: Secondary | ICD-10-CM

## 2021-05-26 DIAGNOSIS — Z8249 Family history of ischemic heart disease and other diseases of the circulatory system: Secondary | ICD-10-CM

## 2021-05-26 DIAGNOSIS — K649 Unspecified hemorrhoids: Secondary | ICD-10-CM | POA: Diagnosis present

## 2021-05-26 DIAGNOSIS — I69359 Hemiplegia and hemiparesis following cerebral infarction affecting unspecified side: Secondary | ICD-10-CM

## 2021-05-26 DIAGNOSIS — R9431 Abnormal electrocardiogram [ECG] [EKG]: Secondary | ICD-10-CM | POA: Diagnosis present

## 2021-05-26 LAB — URINALYSIS, ROUTINE W REFLEX MICROSCOPIC
Bilirubin Urine: NEGATIVE
Glucose, UA: NEGATIVE mg/dL
Ketones, ur: 5 mg/dL — AB
Nitrite: NEGATIVE
Protein, ur: NEGATIVE mg/dL
Specific Gravity, Urine: 1.017 (ref 1.005–1.030)
pH: 5 (ref 5.0–8.0)

## 2021-05-26 LAB — CBC WITH DIFFERENTIAL/PLATELET
Abs Immature Granulocytes: 0.02 10*3/uL (ref 0.00–0.07)
Basophils Absolute: 0.1 10*3/uL (ref 0.0–0.1)
Basophils Relative: 1 %
Eosinophils Absolute: 0.2 10*3/uL (ref 0.0–0.5)
Eosinophils Relative: 2 %
HCT: 35 % — ABNORMAL LOW (ref 36.0–46.0)
Hemoglobin: 10.8 g/dL — ABNORMAL LOW (ref 12.0–15.0)
Immature Granulocytes: 0 %
Lymphocytes Relative: 38 %
Lymphs Abs: 3.6 10*3/uL (ref 0.7–4.0)
MCH: 27.2 pg (ref 26.0–34.0)
MCHC: 30.9 g/dL (ref 30.0–36.0)
MCV: 88.2 fL (ref 80.0–100.0)
Monocytes Absolute: 0.8 10*3/uL (ref 0.1–1.0)
Monocytes Relative: 9 %
Neutro Abs: 4.9 10*3/uL (ref 1.7–7.7)
Neutrophils Relative %: 50 %
Platelets: 270 10*3/uL (ref 150–400)
RBC: 3.97 MIL/uL (ref 3.87–5.11)
RDW: 19.2 % — ABNORMAL HIGH (ref 11.5–15.5)
WBC: 9.5 10*3/uL (ref 4.0–10.5)
nRBC: 0 % (ref 0.0–0.2)

## 2021-05-26 LAB — COMPREHENSIVE METABOLIC PANEL
ALT: 9 U/L (ref 0–44)
AST: 12 U/L — ABNORMAL LOW (ref 15–41)
Albumin: 2.2 g/dL — ABNORMAL LOW (ref 3.5–5.0)
Alkaline Phosphatase: 59 U/L (ref 38–126)
Anion gap: 6 (ref 5–15)
BUN: 39 mg/dL — ABNORMAL HIGH (ref 6–20)
CO2: 27 mmol/L (ref 22–32)
Calcium: 9.3 mg/dL (ref 8.9–10.3)
Chloride: 107 mmol/L (ref 98–111)
Creatinine, Ser: 2.14 mg/dL — ABNORMAL HIGH (ref 0.44–1.00)
GFR, Estimated: 26 mL/min — ABNORMAL LOW (ref >60)
Glucose, Bld: 92 mg/dL (ref 70–99)
Potassium: 4.5 mmol/L (ref 3.5–5.1)
Sodium: 140 mmol/L (ref 135–145)
Total Bilirubin: 0.5 mg/dL (ref 0.3–1.2)
Total Protein: 5.1 g/dL — ABNORMAL LOW (ref 6.5–8.1)

## 2021-05-26 LAB — I-STAT CHEM 8, ED
BUN: 40 mg/dL — ABNORMAL HIGH (ref 6–20)
Calcium, Ion: 1.24 mmol/L (ref 1.15–1.40)
Chloride: 106 mmol/L (ref 98–111)
Creatinine, Ser: 2.3 mg/dL — ABNORMAL HIGH (ref 0.44–1.00)
Glucose, Bld: 90 mg/dL (ref 70–99)
HCT: 33 % — ABNORMAL LOW (ref 36.0–46.0)
Hemoglobin: 11.2 g/dL — ABNORMAL LOW (ref 12.0–15.0)
Potassium: 4.3 mmol/L (ref 3.5–5.1)
Sodium: 142 mmol/L (ref 135–145)
TCO2: 30 mmol/L (ref 22–32)

## 2021-05-26 LAB — RESP PANEL BY RT-PCR (FLU A&B, COVID) ARPGX2
Influenza A by PCR: NEGATIVE
Influenza B by PCR: NEGATIVE
SARS Coronavirus 2 by RT PCR: NEGATIVE

## 2021-05-26 LAB — PROTIME-INR
INR: 5 (ref 0.8–1.2)
Prothrombin Time: 46.5 seconds — ABNORMAL HIGH (ref 11.4–15.2)

## 2021-05-26 LAB — LIPASE, BLOOD: Lipase: 21 U/L (ref 11–51)

## 2021-05-26 LAB — LACTIC ACID, PLASMA: Lactic Acid, Venous: 1.2 mmol/L (ref 0.5–1.9)

## 2021-05-26 LAB — POC OCCULT BLOOD, ED: Fecal Occult Bld: POSITIVE — AB

## 2021-05-26 MED ORDER — FENTANYL CITRATE PF 50 MCG/ML IJ SOSY
50.0000 ug | PREFILLED_SYRINGE | INTRAMUSCULAR | Status: AC | PRN
  Administered 2021-05-26 (×2): 50 ug via INTRAVENOUS
  Filled 2021-05-26 (×2): qty 1

## 2021-05-26 MED ORDER — SODIUM CHLORIDE 0.9 % IV BOLUS
1000.0000 mL | Freq: Once | INTRAVENOUS | Status: AC
  Administered 2021-05-26: 18:00:00 1000 mL via INTRAVENOUS

## 2021-05-26 NOTE — ED Provider Notes (Signed)
[facility] EMERGENCY DEPARTMENT Provider Note   CSN: [id number] Arrival date & time: 05/26/21  1405     History No chief complaint on file.   Connie Ruiz is a 60 y.o. female.  HPI Patient is a 60 year old female with past medical history significant for CVA with right hemiparesis, DVT on Coumadin for anticoagulation, kidney transplant history, lupus, PE  Patient was sent to the emergency room today from [name] care/rehab where she has been since her last hospital visit it seems that she has become a long-term resident.  Patient is accompanied by her daughter who provides the vast majority of the history due to some aphasia that the patient suffers from her stroke deficits along with right-sided hemiparesis  Per daughter Patient was brought to the emergency room because of rectal bleeding in the setting of anticoagulation.  Patient takes Coumadin and apparently her INR was 6.2 yesterday.  Coumadin was held overnight and the plan was to recheck her INR today however these labs are not stat and when she was evaluated today she had blood in her diaper.  She complains of abdominal pain however it seems that this is a chronic issue and she is not suffering any significant change in her abdominal pain today.  She denies any chest pain or difficulty breathing.  She does not ambulate or even sit up on her own and denies any lightheadedness or dizziness.  She has a history of sacral wounds which are attended for at facility.  Patient denies any fevers chills nausea or vomiting.     Past Medical History:  Diagnosis Date   Anxiety    Candida esophagitis ([place]) 11/12/2014   CEREBROVASCULAR ACCIDENT, ACUTE 04/15/2010   CLOSTRIDIUM DIFFICILE COLITIS, HX OF 08/21/2007   CONGESTIVE HEART FAILURE 08/21/2007   Current use of long term anticoagulation    Dr. Andree Elk, [facility]   CVA 04/17/2010   Depression    Dr. Andree Elk, [place], TYPE II 08/21/2007   DVT, HX  OF 08/21/2007   GERD 08/21/2007   GOUT 08/21/2007   History of stroke with residual effects    HYPERLIPIDEMIA 08/21/2007   HYPERTENSION 08/21/2007   Dr. Andree Elk, [place], HX OF 08/22/2007   s/p renal transplant-Dr. 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   Hypothyroidism 01/14/2018   HLD (hyperlipidemia) 01/14/2018   CVA (cerebral vascular accident) ([place]) 01/12/2018   Chest pain 01/11/2018   Acute respiratory failure with hypoxia ([place]) 07/20/2017   Abdominal bloating 07/20/2017   Sinus tachycardia 07/20/2017   SLE (systemic lupus erythematosus related syndrome) ([place]) 07/20/2017   CAP (community acquired pneumonia) 07/16/2017   Screening examination for infectious disease 01/28/2016   Type 2 diabetes mellitus ([place]) 08/03/2015   Urinary tract infection 07/20/2015   Vascular dementia ([place]) 05/08/2015   Hemiparesis as late effect of cerebrovascular accident (CVA) ([place]) 05/08/2015   Aphasia as late effect of stroke 05/08/2015   C. difficile colitis 02/25/2015   Hypokalemia 02/25/2015   CKD (chronic kidney disease) 02/25/2015   Colitis 02/14/2015   Sepsis ([place]) 02/14/2015  Nausea vomiting and diarrhea 02/14/2015   Abdominal pain 02/14/2015   UTI (lower urinary tract infection) 02/14/2015   Abdominal pain, chronic, generalized 01/07/2015   Candida esophagitis ([place]) 11/12/2014   Abnormal CT of the abdomen    Steroid-induced hyperglycemia 11/09/2014   Hypomagnesemia 11/06/2014   Acute abdominal pain    Supratherapeutic INR 11/02/2014   Jejunitis 11/02/2014   Myalgia and myositis 04/05/2014   Wellness  examination 04/05/2014   Menopausal state 04/05/2014   Palpitations 08/31/2013   Hyperlipidemia 08/31/2013   Disorder of heart rhythm 08/13/2013   TIA (transient ischemic attack) 06/20/2013   Gout flare: R elbow and R shoulder 06/20/2013   Acute encephalopathy 06/14/2013   Rhabdomyolysis 06/14/2013   History of stroke with residual effects    Right sided weakness    Breast mass, left 04/30/2013   Contusion of knee, left 10/18/2012   Depression 10/05/2012   Encounter for long-term (current) use of other medications 10/04/2012   Multiple thyroid nodules 06/08/2012   Risk for falls 05/05/2012   Physical deconditioning 05/05/2012   Fever 04/27/2012   DVT (deep venous thrombosis) ([place]) 06/17/2011   Expressive aphasia 06/17/2011   Dysphasia 06/11/2011   Incontinence of urine 06/11/2011   Hand pain, left 06/11/2011   Immunosuppressive management encounter following kidney transplant May 24, 2011   Hypertension goal BP (blood pressure) < 130/80 05/24/11   Deceased-donor kidney transplant recipient 24-May-2011   Arm pain, left 05/07/2011   Dysfunctional voiding of urine 04/28/2011   Urge incontinence 04/28/2011   Urinary urgency 04/28/2011   Routine general medical examination at a health care facility 04/13/2011   Anticoagulated 01/08/2011   Pulmonary embolism ([place]) 07/16/2010   Cerebral artery occlusion with cerebral infarction ([place]) 04/17/2010   CEREBROVASCULAR ACCIDENT, ACUTE 04/15/2010   THYROID NODULE, LEFT 04/10/2009   Cough 03/26/2009   ACUTE BRONCHITIS 08/22/2007   KIDNEY TRANSPLANTATION, HX OF 08/22/2007   Gout 08/21/2007   Essential hypertension 08/21/2007   Chronic diastolic congestive heart failure ([place]) 08/21/2007   GERD 08/21/2007   Disorder resulting from impaired renal function 08/21/2007   LUPUS 08/21/2007   Osteoporosis 08/21/2007   DVT, HX OF 08/21/2007   Enteritis due to Clostridium difficile 08/21/2007    Past Surgical History:  Procedure Laterality  Date   BIOPSY  02/04/2021   Procedure: BIOPSY;  Surgeon: [name], MD;  Location: [facility] END[facility];  Service: Endoscopy;;   CESAREAN SECTION     CHOLECYSTECTOMY     COLONOSCOPY  06/2000   [facility]    ENTEROSCOPY N/A 11/11/2014   Procedure: ENTEROSCOPY;  Surgeon: Ladene Artist, MD;  Location: [facility] ENDOSCOPY;  Service: Endoscopy;  Laterality: N/A;   FLEXIBLE SIGMOIDOSCOPY N/A 02/04/2021   Procedure: FLEXIBLE SIGMOIDOSCOPY;  Surgeon: [name], MD;  Location: [facility] ENDOSCOPY;  Service: Endoscopy;  Laterality: N/A;   KIDNEY TRANSPLANT Right 2009   RENAL BIOPSY, OPEN  1981   TUBAL LIGATION       OB History     Gravida  1   Para  1   Term  1   Preterm      AB      Living  1      SAB      IAB      Ectopic      Multiple      Live Births  1           Family History  Problem Relation Age of Onset   Heart attack Mother    Heart disease  Father    Asthma Sister    Asthma Daughter    Cancer Maternal Grandfather        prostate   Cancer Paternal Grandfather        colon    Social History   Tobacco Use   Smoking status: Never   Smokeless tobacco: Never  Vaping Use   Vaping Use: Never used  Substance Use Topics   Alcohol use: No    Alcohol/week: 0.0 standard drinks   Drug use: No    Home Medications Prior to Admission medications   Medication Sig Start Date End Date Taking? Authorizing Provider  calcitRIOL (ROCALTROL) 0.25 MCG capsule TAKE 1 CAPSULE(0.25 MCG) BY MOUTH DAILY. NO REFILLS WITHOUT APPOINTMENT Patient taking differently: Take 0.25 mcg by mouth daily. 02/13/19  Yes Renato Shin, MD  carvedilol (COREG) 6.25 MG tablet Take 1 tablet (6.25 mg total) by mouth 2 (two) times daily with a meal. 02/13/21  Yes Little Ishikawa, MD  cholecalciferol (VITAMIN D3) 25 MCG (1000 UNIT) tablet Take 1,000 Units by mouth daily.   Yes [provider]  dicyclomine (BENTYL) 10 MG capsule Take 1 capsule (10 mg total) by mouth 3 (three) times daily before  meals. 02/13/21  Yes Little Ishikawa, MD  diltiazem [facility]) 360 MG 24 hr capsule Take 1 capsule (360 mg total) by mouth daily. 06/27/19  Yes [name], [name], MD  donepezil (ARICEPT) 10 MG tablet Take 10 mg by mouth at bedtime.  11/18/16  Yes [provider]  esomeprazole (NEXIUM) 20 MG capsule TAKE 1 CAPSULE(20 MG) BY MOUTH DAILY Patient taking differently: Take 20 mg by mouth daily at 12 noon. 07/17/18  Yes Renato Shin, MD  ADMELOG 100 UNIT/ML injection Inject into the skin. 12/21/20   [provider]  ADMELOG SOLOSTAR 100 UNIT/ML KwikPen Inject into the skin. 05/18/21   [provider]  Blood Glucose Monitoring Suppl (ACCU-CHEK AVIVA PLUS) w/Device KIT 1 Device by Does not apply route daily. 10/13/18   [name], [name], MD  glucose blood (ONE TOUCH ULTRA TEST) test strip Use to check blood sugar 1 time per day 02/12/15   Renato Shin, MD  insulin lispro (HUMALOG) 100 UNIT/ML injection Inject 0-12 Units into the skin 3 (three) times daily before meals. Per Sliding Scale. If blood sugar is: 0-150: 0 units 151-200: 2 units 201-250: 4 units 251-300: 6 units 301-350: 8 units 351-400: 10 units 401-450: 12 units Patient not taking: Reported on 02/16/2021    [provider]  levothyroxine (SYNTHROID) 150 MCG tablet Take 1 tablet (150 mcg total) by mouth daily at 6 (six) AM. 02/14/21   Little Ishikawa, MD  magnesium oxide (MAG-OX) 400 MG tablet Take 800 mg by mouth daily.  01/11/17   [provider]  mirtazapine (REMERON) 30 MG tablet Take 1 tablet (30 mg total) by mouth at bedtime. 07/28/15   Pieter Partridge, DO  morphine (MS CONTIN) 15 MG 12 hr tablet Take 15 mg by mouth 3 (three) times daily. 05/20/21   [provider]  multivitamin (RENA-VIT) TABS tablet Take 1 tablet by mouth daily.    [provider]  mycophenolate (CELLCEPT) 250 MG capsule Take 250 mg by mouth 2 (two) times daily.  07/13/17   [provider]  NONFORMULARY OR  COMPOUNDED ITEM [facility]:  Pain Cream - Ketamine 5%, Baclofen 2%, Gabapentin 5%, Lidocaine 5%, Menthol 1%, apply 1-2 grams to affected area 3-4 times a day for pain. 12/18/19   Valentina Lucks,  Viona Gilmore, DPM  nystatin (MYCOSTATIN) powder Apply 1 g topically 4 (four) times daily as needed. For yeast under breast    [provider]  ondansetron (ZOFRAN ODT) 4 MG disintegrating tablet 42m ODT q4 hours prn nausea/vomiting Patient taking differently: Take 4 mg by mouth every 4 (four) hours as needed for nausea or vomiting. 06/15/15   Mesner, JCorene Cornea MD  ONovamed [facility] Dba Center For Reconstructive SurgeryDELICA LANCETS [id number] Use to check blood sugar 1 time per day 02/12/15   ERenato Shin MD  Potassium Chloride ER 20 MEQ TBCR Take 1 tablet by mouth daily. NEEDS POTASSIUM RE-CHECK. 06/27/19   JMartinique [name], MD  potassium chloride SA (KLOR-CON M) 20 MEQ tablet Take 20 mEq by mouth daily. 04/27/21   [provider]  predniSONE (DELTASONE) 5 MG tablet Take 5 mg by mouth daily with breakfast.  07/13/17   [provider]  psyllium (METAMUCIL) 58.6 % packet Take 1 packet by mouth daily.    [provider]  sertraline (ZOLOFT) 50 MG tablet Take 50 mg by mouth daily. 05/12/21   [provider]  Skin Protectants, Misc. (EUCERIN) cream Apply 1 application topically daily. To feet    [provider]  sucralfate (CARAFATE) 1 g tablet Take 1 tablet (1 g total) by mouth 4 (four) times daily for 30 days. 08/22/18 02/16/21  Pokhrel, LCorrie Mckusick MD  tacrolimus (PROGRAF) 0.5 MG capsule Take 0.5 mg by mouth 2 (two) times daily. 04/17/21   [provider]  tacrolimus (PROGRAF) 1 MG capsule Take 2.5 mg by mouth every 12 (twelve) hours. 01/15/18   [provider]  warfarin (COUMADIN) 2 MG tablet Take 2 mg by mouth daily.    [provider]  warfarin (COUMADIN) 4 MG tablet Take by mouth. 05/18/21   [provider]  warfarin (COUMADIN) 5 MG tablet TAKE 1/2 TO 1 TABLET BY MOUTH ON [address] [place] SMullensSUNDAY-2.5MG Patient not taking: Reported on 02/16/2021 03/27/19   JMartinique [name], MD    Allergies    Oxycodone-acetaminophen, Propoxyphene, Propoxyphene n-acetaminophen, Sulfonamide derivatives, Codeine, Hydrocodone-acetaminophen, Hydromorphone, Other, Oxycodone, Sulfamethoxazole, Tape, Gabapentin, Latex, Metoprolol, Morphine, Morphine and related, and Rosiglitazone  Review of Systems   Review of Systems  Constitutional:  Negative for chills and fever.  HENT:  Negative for congestion.   Eyes:  Negative for pain.  Respiratory:  Negative for cough and shortness of breath.   Cardiovascular:  Negative for chest pain and leg swelling.  Gastrointestinal:  Positive for abdominal pain and blood in stool. Negative for nausea and vomiting.  Genitourinary:  Negative for dysuria.  Musculoskeletal:  Negative for myalgias.  Skin:  Negative for rash.  Neurological:  Negative for dizziness and headaches.   Physical Exam Updated Vital Signs BP 128/84 (BP Location: Left Arm)    Pulse (!) 102    Temp 98.2 F (36.8 C) (Oral)    SpO2 95%   Physical Exam Vitals and nursing note reviewed.  Constitutional:      General: She is not in acute distress.    Comments: Chronically ill-appearing morbidly obese 60year old female.  HENT:     Head: Normocephalic and atraumatic.     Nose: Nose normal.  Eyes:     General: No scleral icterus. Cardiovascular:     Rate and Rhythm: Normal rate and regular rhythm.     Pulses: Normal pulses.     Heart sounds: Normal heart sounds.  Pulmonary:     Effort: Pulmonary effort is normal. No  respiratory distress.     Breath sounds: No wheezing.  Abdominal:     Palpations: Abdomen is soft.     Tenderness: There is no guarding or rebound.     Comments: Abdomen is soft  Genitourinary:    Comments: GU exam notable for scant maroon blood around anus No obvious fissures Musculoskeletal:     Cervical back: Normal range  of motion.     Right lower leg: No edema.     Left lower leg: No edema.  Skin:    General: Skin is warm and dry.     Capillary Refill: Capillary refill takes less than 2 seconds.     Comments: Small sacral ulcers that are perhaps grade 1 pressure ulcers x3.  Neurological:     Mental Status: She is alert. Mental status is at baseline.     Comments: At baseline which is slow to respond and slow to find words but alert and oriented x3.  Right-sided hemiparesis  Psychiatric:        Mood and Affect: Mood normal.        Behavior: Behavior normal.    ED Results / Procedures / Treatments   Labs (all labs ordered are listed, but only abnormal results are displayed) Labs Reviewed  POC OCCULT BLOOD, ED - Abnormal; Notable for the following components:      Result Value   Fecal Occult Bld POSITIVE (*)    All other components within normal limits  RESP PANEL BY RT-PCR (FLU A&B, COVID) ARPGX2  CBC WITH DIFFERENTIAL/PLATELET  COMPREHENSIVE METABOLIC PANEL  LIPASE, BLOOD  URINALYSIS, ROUTINE W REFLEX MICROSCOPIC  PROTIME-INR  LACTIC ACID, PLASMA  LACTIC ACID, PLASMA  I-STAT CHEM 8, ED  TYPE AND SCREEN    EKG None  Radiology No results found.  Procedures Procedures   Medications Ordered in ED Medications  fentaNYL (SUBLIMAZE) injection 50 mcg (has no administration in time range)  sodium chloride 0.9 % bolus 1,000 mL (has no administration in time range)    ED Course  I have reviewed the triage vital signs and the nursing notes.  Pertinent labs & imaging results that were available during my care of the patient were reviewed by me and considered in my medical decision making (see chart for details).    MDM Rules/Calculators/A&P                          Patient is a 60 year old female presented to the ER today she is anticoagulated on Coumadin she is mentating well has normal blood pressure mild tachycardia She is a [name] and states that under no circumstances  would she like to receive blood products.  On my examination she has some hematochezia.  Will require labs.  Daughter phone number is [phone] Seems that patient's gastroenterologist is a [facility] provider in [place].   Final Clinical Impression(s) / ED Diagnoses Final diagnoses:  Gastrointestinal hemorrhage, unspecified gastrointestinal hemorrhage type    Rx / DC Orders ED Discharge Orders     None        Tedd Sias, [place] 05/26/21 1552    Wyvonnia Dusky, MD 05/26/21 1815

## 2021-05-26 NOTE — ED Notes (Signed)
Got patient on the monitor patient is resting with call bell in reach and family at bedside °

## 2021-05-26 NOTE — ED Notes (Signed)
Can update daughter admission (room assignment) vs. Going back to [facility].  Daughter informed admission is likely.

## 2021-05-26 NOTE — ED Provider Notes (Signed)
Pt's care assumed at [place] had difficulty obtaining labs.  Pt monitored and has had only small amount of blood in diaper.  Pt denies abdominal pain.  Pt had colonoscopy 8/22. Pt's INR is 5.0.  Hemoglobin 10.8  (previous 11.7 and 10.6 one month ago)   I spoke with Hospitalist for admission    Sidney Ace 05/26/21 2252    Tegeler, Gwenyth Allegra, MD 05/26/21 [phone]

## 2021-05-26 NOTE — H&P (Signed)
History and Physical    Connie Ruiz CWU:[id number] DOB: [birth year] DOA: 05/26/2021  PCP: [name], [name], MD   Patient coming from:     SNF  Chief Complaint:     Rectal bleeding  HPI: Connie Ruiz is a 60 y.o. female with medical history significant for CVA with right hemiparesis, DVT on Coumadin for anticoagulation, kidney transplant history, lupus, PE, expressive aphasia, DMT2 who is sent in from [place] care[facility]rehab center due to rectal bleeding. She is on coumadin chronically and yesterday had an INR of 6.2.  Coumadin was held and INR was rechecked today and was still elevated so she was sent into to the ER.  She was found to have some bright red blood in her diaper and some rectal bleeding.  She presses aphasia and is unable to provide a very detailed history but she does give affirmative response when asked if she has abdominal pain and she winces if her abdomen is palpated.  No family or staff from the facility are at bedside.  There is no report of any fever or vomiting.  Reportedly patient had episode of GI bleeding in August of this year and underwent colonoscopy at that time per report.  Colonoscopy findings are unknown at this time.   ED Course: Ms. Rumberger is been hemodynamically stable in the emergency room.  He is found to have acute kidney injury with creatinine of 2.30 which is up from a baseline around 1.  Hemoglobin is 10.8-11.2 which is at her baseline level.  Lactic acid 1.2.  White blood cells and platelets are normal.  Electrolytes are normal.  COVID swab is negative.  He is negative for influenza A and B.  She was Hemoccult positive in the emergency room with no identified to hemorrhoids or fistulas on rectal exam.  CT of the abdomen pelvis showed no acute intra-abdominal pathology.  Hospitalist service asked to admit for further management  Review of Systems:  Unable to obtain review of systems secondary to expressive aphasia  Past Medical History:  Diagnosis Date    Anxiety    Candida esophagitis () 11/12/2014   CEREBROVASCULAR ACCIDENT, ACUTE 04/15/2010   CLOSTRIDIUM DIFFICILE COLITIS, HX OF 08/21/2007   CONGESTIVE HEART FAILURE 08/21/2007   Current use of long term anticoagulation    Dr. Andree Elk, [facility]   CVA 04/17/2010   Depression    Dr. Andree Elk, [place], TYPE II 08/21/2007   DVT, HX OF 08/21/2007   GERD 08/21/2007   GOUT 08/21/2007   History of stroke with residual effects    HYPERLIPIDEMIA 08/21/2007   HYPERTENSION 08/21/2007   Dr. Andree Elk, [place], HX OF 08/22/2007   s/p renal transplant-Dr. Bonney Leitz   LUPUS 08/21/2007   OSTEOPOROSIS 08/21/2007   Rheumatol at [facility]   Pulmonary embolism ([place]) 07/16/2010   Renal failure    RENAL INSUFFICIENCY 08/21/2007   Right sided weakness    Steroid-induced hyperglycemia 11/09/2014   Tachycardia    THYROID NODULE, LEFT 04/10/2009    Past Surgical History:  Procedure Laterality Date   BIOPSY  02/04/2021   Procedure: BIOPSY;  Surgeon: [name], MD;  Location: [facility];  Service: Endoscopy;;   CESAREAN SECTION     CHOLECYSTECTOMY     COLONOSCOPY  06/2000   [facility]    ENTEROSCOPY N/A 11/11/2014   Procedure: ENTEROSCOPY;  Surgeon: Ladene Artist, MD;  Location: [facility];  Service: Endoscopy;  Laterality: N/A;   FLEXIBLE SIGMOIDOSCOPY  N/A 02/04/2021   Procedure: FLEXIBLE SIGMOIDOSCOPY;  Surgeon: [name], MD;  Location: [facility];  Service: Endoscopy;  Laterality: N/A;   KIDNEY TRANSPLANT Right 2009   RENAL BIOPSY, OPEN  1981   TUBAL LIGATION      Social History  reports that she has never smoked. She has never used smokeless tobacco. She reports that she does not drink alcohol and does not use drugs.  Allergies  Allergen Reactions   Oxycodone-Acetaminophen Shortness Of Breath and Nausea Only   Propoxyphene Nausea Only and Shortness Of Breath    States takes tylenol at home   Propoxyphene N-Acetaminophen Shortness Of Breath and Nausea  Only   Sulfonamide Derivatives Shortness Of Breath and Nausea Only   Codeine Nausea Only   Hydrocodone-Acetaminophen    Hydromorphone    Other Other (See Comments)    No blood, Jehovaeh Witness    Oxycodone    Sulfamethoxazole    Tape     Redness**PAPER TAPE OK**   Gabapentin Anxiety    twitching   Latex Rash   Metoprolol Rash   Morphine Rash    Patient is given dose of Benadryl when taking Morphine    Morphine And Related Rash    IV site on arm is red, patient reports this is improving.  NO shortness of breath reported.   Rosiglitazone Rash    Family History  Problem Relation Age of Onset   Heart attack Mother    Heart disease Father    Asthma Sister    Asthma Daughter    Cancer Maternal Grandfather        prostate   Cancer Paternal Grandfather        colon     Prior to Admission medications   Medication Sig Start Date End Date Taking? Authorizing Provider  acetaminophen (TYLENOL) 500 MG tablet Take 1,000 mg by mouth every 6 (six) hours as needed for moderate pain.   Yes [provider]  alendronate (FOSAMAX) 70 MG tablet Take 70 mg by mouth once a week. Take with a full glass of water on an empty stomach on Saturdays.   Yes [provider]  calcitRIOL (ROCALTROL) 0.25 MCG capsule TAKE 1 CAPSULE(0.25 MCG) BY MOUTH DAILY. NO REFILLS WITHOUT APPOINTMENT Patient taking differently: Take 0.25 mcg by mouth daily. 02/13/19  Yes Renato Shin, MD  carvedilol (COREG) 6.25 MG tablet Take 1 tablet (6.25 mg total) by mouth 2 (two) times daily with a meal. 02/13/21  Yes Little Ishikawa, MD  cholecalciferol (VITAMIN D3) 25 MCG (1000 UNIT) tablet Take 1,000 Units by mouth daily.   Yes [provider]  dicyclomine (BENTYL) 10 MG capsule Take 1 capsule (10 mg total) by mouth 3 (three) times daily before meals. 02/13/21  Yes Little Ishikawa, MD  diltiazem [facility]) 360 MG 24 hr capsule Take 1 capsule (360 mg total) by mouth daily. 06/27/19  Yes [name], Betty  G, MD  donepezil (ARICEPT) 10 MG tablet Take 10 mg by mouth at bedtime.  11/18/16  Yes [provider]  esomeprazole (NEXIUM) 20 MG capsule TAKE 1 CAPSULE(20 MG) BY MOUTH DAILY Patient taking differently: Take 20 mg by mouth daily at 12 noon. 07/17/18  Yes Renato Shin, MD  famotidine (PEPCID) 20 MG tablet Take 20 mg by mouth 2 (two) times daily.   Yes [provider]  furosemide (LASIX) 20 MG tablet Take 20 mg by mouth daily.   Yes [provider]  glucose blood (ONE TOUCH ULTRA TEST) test  strip Use to check blood sugar 1 time per day 02/12/15  Yes Renato Shin, MD  insulin lispro (HUMALOG) 100 UNIT/ML injection Inject 0-12 Units into the skin 3 (three) times daily before meals. Per Sliding Scale. If blood sugar is: 0-150: 0 units 151-200: 2 units 201-250: 4 units 251-300: 6 units 301-350: 8 units 351-400: 10 units 401-450: 12 units   Yes [provider]  levothyroxine (SYNTHROID) 150 MCG tablet Take 1 tablet (150 mcg total) by mouth daily at 6 (six) AM. 02/14/21  Yes Little Ishikawa, MD  loperamide (IMODIUM) 2 MG capsule Take 2 mg by mouth every 6 (six) hours as needed for diarrhea or loose stools.   Yes [provider]  magnesium oxide (MAG-OX) 400 MG tablet Take 800 mg by mouth daily.  01/11/17  Yes [provider]  mirtazapine (REMERON) 30 MG tablet Take 1 tablet (30 mg total) by mouth at bedtime. 07/28/15  Yes Jaffe, Adam R, DO  PSYLLIUM PO Take 1 packet by mouth daily. 3g/5.4g: 2tsp -mix 1sp in [place] of water qd   Yes [provider]  sertraline (ZOLOFT) 50 MG tablet Take 50 mg by mouth daily. 05/12/21  Yes [provider]  Skin Protectants, Misc. (EUCERIN) cream Apply 1 application topically daily. To feet   Yes [provider]  sucralfate (CARAFATE) 1 g tablet Take 1 tablet (1 g total) by mouth 4 (four) times daily for 30 days. 08/22/18 05/26/21 Yes Pokhrel, Laxman, MD  tacrolimus (PROGRAF) 0.5 MG capsule Take  0.5 mg by mouth See admin instructions. Takes 0.9m cap with 2 167mTacrolimus to equal a total 2.11m39mvery 12 hours 04/17/21  Yes [provider]  tacrolimus (PROGRAF) 1 MG capsule Take 2 mg by mouth See admin instructions. Takes 2 1mg4mpsules with Tacrolimus 0.11mg 51msules to = 2.11mg e57my 12 hours 01/15/18  Yes [provider]  Blood Glucose Monitoring Suppl (ACCU-CHEK AVIVA PLUS) w/Device KIT 1 Device by Does not apply route daily. 10/13/18   JordanMartiniquey G, MD  ondansetron (ZOFRAN ODT) 4 MG disintegrating tablet 4mg OD43m4 hours prn nausea/vomiting Patient taking differently: Take 4 mg by mouth every 4 (four) hours as needed for nausea or vomiting. 06/15/15   Mesner, Jason, [facility]OU[facility] LANCETS 33G MIS35Dse to check blood sugar 1 time per day 02/12/15   EllisonRenato Shinarfarin (COUMADIN) 1 MG tablet Take 0.5 mg by mouth See admin instructions. Takes 1/2 tab with 3mg tab411m equal a total of 3.11mg ever52mther day. Patient not taking: Reported on 05/26/2021    [provider]  warfarin (COUMADIN) 3 MG tablet Take 3 mg by mouth See admin instructions. Takes 1 3mg tab w40m 1/2 of a 1mg tab to59mual a total of 3.11mg every o51mr day Patient not taking: Reported on 05/26/2021    [provider]  warfarin (COUMADIN) 4 MG tablet Take by mouth. Patient not taking: Reported on 05/26/2021 05/18/21   [provider]    Physical Exam: Vitals:   05/26/21 2030 05/26/21 2100 05/26/21 2130 05/26/21 2200  BP: (!) 123/55 133/68 117/73 107/76  Pulse: 96 (!) 104 99   Resp: 12 (!) 21 16 (!) 23  Temp:      TempSrc:      SpO2: 93% 95% 98%     Constitutional: NAD, calm, comfortable Vitals:   05/26/21 2030 05/26/21 2100 05/26/21 2130 05/26/21 2200  BP: (!) 123/55 133/68 117/73 107/76  Pulse: 96 (!)  104 99   Resp: 12 (!) 21 16 (!) 23  Temp:      TempSrc:      SpO2: 93% 95% 98%    General: WDWN, Alert and oriented to self.  Eyes: EOMI, PERRL, conjunctivae  normal.  Sclera nonicteric HENT:  [place]/AT, external ears normal. Nares patent without epistasis.  Mucous membranes are dry. Posterior pharynx clear of any exudate  Neck: Soft, normal range of motion, supple, no masses, Trachea midline Respiratory: clear to auscultation bilaterally, no wheezing, no crackles. Normal respiratory effort. No accessory muscle use.  Cardiovascular: Regular rhythm, tachycardia. 2/6 murmur. No rubs / gallops. No extremity edema. Abdomen: Soft, diffuse tenderness, nondistended, no rebound or guarding. Obese. Bowel sounds hypoactive Musculoskeletal: no cyanosis. No joint deformity upper and lower extremities. Normal muscle tone in left extremities..  Skin: Warm, dry, intact no rashes, lesions, ulcers. No induration Neurologic:  Speech is slow. Has expressive aphasia. Right side hemiparesis. No tremor Psychiatric: Flat affect   Labs on Admission: I have personally reviewed following labs and imaging studies  CBC: Recent Labs  Lab 05/26/21 1813 05/26/21 1840  WBC 9.5  --   NEUTROABS 4.9  --   HGB 10.8* 11.2*  HCT 35.0* 33.0*  MCV 88.2  --   PLT 270  --     Basic Metabolic Panel: Recent Labs  Lab 05/26/21 1813 05/26/21 1840  NA 140 142  K 4.5 4.3  CL 107 106  CO2 27  --   GLUCOSE 92 90  BUN 39* 40*  CREATININE 2.14* 2.30*  CALCIUM 9.3  --     GFR: CrCl cannot be calculated (Unknown ideal weight.).  Liver Function Tests: Recent Labs  Lab 05/26/21 1813  AST 12*  ALT 9  ALKPHOS 59  BILITOT 0.5  PROT 5.1*  ALBUMIN 2.2*    Urine analysis:    Component Value Date/Time   COLORURINE YELLOW 05/26/2021 2122   APPEARANCEUR CLOUDY (A) 05/26/2021 2122   LABSPEC 1.017 05/26/2021 2122   PHURINE 5.0 05/26/2021 2122   GLUCOSEU NEGATIVE 05/26/2021 2122   GLUCOSEU NEGATIVE 10/22/2013 1621   HGBUR LARGE (A) 05/26/2021 2122   BILIRUBINUR NEGATIVE 05/26/2021 2122   KETONESUR 5 (A) 05/26/2021 2122   PROTEINUR NEGATIVE 05/26/2021 2122   UROBILINOGEN 1.0  03/27/2015 1534   NITRITE NEGATIVE 05/26/2021 2122   LEUKOCYTESUR LARGE (A) 05/26/2021 2122    Radiological Exams on Admission: No results found.  EKG: Independently reviewed.  EKG shows sinus tachycardia with LVH by voltage criteria.  No acute ST elevation or depression.  Prolonged QTc  of 504  Assessment/Plan Principal Problem:   AKI (acute kidney injury) Ms. Dunavan is admitted to medical telemetry floor IVF hydration with LR at 75 ml/hr overnight Check renal function and electrolytes in am If renal function does not improve would get renal u/s  Active Problems:   Rectal bleeding Has small amount of bright red blood per rectum. Had rectal exam by ER PA.  Consult GI in am. Check serial Hgb/Hct levels.    Anemia of chronic disease Hgb level stable at this time    Type 2 diabetes mellitus  Monitor blood sugar with meals and at bedtime.  Sliding scale insulin provide as needed. Check hemoglobin A1c    Essential hypertension Continue home antihypertensive medication.  Monitor blood pressure    Supratherapeutic INR P.O  vitamin K 2.5 mg given tonight.  Recheck INR in the morning    Expressive aphasia Chronic, secondary to CVA  LUPUS Chronic    Hemiparesis as late effect of cerebrovascular accident (CVA) Chronic    Vascular dementia Chronic.  Aricept is held secondary to prolonged QT interval    Prolonged QT interval Avoid medications or could further prolong QT interval. Monitor on telemetry    KIDNEY TRANSPLANTATION, HX OF   DVT prophylaxis: SCDs for DVT prophylaxis.   Code Status:   Full Code  Family Communication:  No family at bedside Disposition Plan:   Patient is from:  SNF  Anticipated DC to:  SNF  Anticipated DC date:  Anticipate 2 midnight or more stay in the hospital  Time spent on Admission:      75 minutes  Consults called:  Gastroenterology consulted by Secure Chat by ER PA  Admission status:  Inpatient  Yevonne Aline Treva Huyett MD [facility]  How to contact the [facility] Attending or Consulting provider [place] or covering provider during after hours [place], for this patient?   Check the care team in [facility] and look for a) attending/consulting [facility] provider listed and b) the [facility] team listed Log into [email or web address] and use [place] universal password to access. If you do not have the password, please contact the hospital operator. Locate the [facility] provider you are looking for under [facility] and page to a number that you can be directly reached. If you still have difficulty reaching the provider, please page the [facility] (Director on Call) for the Hospitalists listed on amion for assistance.  05/26/2021, 10:18 PM

## 2021-05-26 NOTE — ED Triage Notes (Addendum)
Patient arrived from [facility] due to reported frank rectal bleeding today that was noted while changing patient. Patient also with reported sacral wound. Patient alert and oriented but slow to respond due to previous stroke. Patient complains of general chronic abdominal pain. Daughter reports abdominal pain for over a year and no diagnosis. Reported that INR 6.2 yesterday and coumadin held last night and will be held again tonight. Patient will NOT accept blood products because she is a Neurosurgeon witness. Sacral wound noted and arrived with dressing-frank bleed noted by PA on rectal exam

## 2021-05-27 DIAGNOSIS — K529 Noninfective gastroenteritis and colitis, unspecified: Secondary | ICD-10-CM | POA: Diagnosis not present

## 2021-05-27 DIAGNOSIS — Z9049 Acquired absence of other specified parts of digestive tract: Secondary | ICD-10-CM | POA: Diagnosis not present

## 2021-05-27 DIAGNOSIS — K921 Melena: Secondary | ICD-10-CM | POA: Diagnosis not present

## 2021-05-27 DIAGNOSIS — K649 Unspecified hemorrhoids: Secondary | ICD-10-CM

## 2021-05-27 LAB — CBC
HCT: 32.9 % — ABNORMAL LOW (ref 36.0–46.0)
Hemoglobin: 9.9 g/dL — ABNORMAL LOW (ref 12.0–15.0)
MCH: 26.8 pg (ref 26.0–34.0)
MCHC: 30.1 g/dL (ref 30.0–36.0)
MCV: 88.9 fL (ref 80.0–100.0)
Platelets: 244 10*3/uL (ref 150–400)
RBC: 3.7 MIL/uL — ABNORMAL LOW (ref 3.87–5.11)
RDW: 19.2 % — ABNORMAL HIGH (ref 11.5–15.5)
WBC: 8.9 10*3/uL (ref 4.0–10.5)
nRBC: 0 % (ref 0.0–0.2)

## 2021-05-27 LAB — TYPE AND SCREEN
ABO/RH(D): A POS
Antibody Screen: NEGATIVE

## 2021-05-27 LAB — BASIC METABOLIC PANEL
Anion gap: 6 (ref 5–15)
BUN: 37 mg/dL — ABNORMAL HIGH (ref 6–20)
CO2: 25 mmol/L (ref 22–32)
Calcium: 8.6 mg/dL — ABNORMAL LOW (ref 8.9–10.3)
Chloride: 109 mmol/L (ref 98–111)
Creatinine, Ser: 1.89 mg/dL — ABNORMAL HIGH (ref 0.44–1.00)
GFR, Estimated: 30 mL/min — ABNORMAL LOW (ref >60)
Glucose, Bld: 88 mg/dL (ref 70–99)
Potassium: 4.4 mmol/L (ref 3.5–5.1)
Sodium: 140 mmol/L (ref 135–145)

## 2021-05-27 LAB — PROTIME-INR
INR: 4.5 (ref 0.8–1.2)
Prothrombin Time: 42.5 seconds — ABNORMAL HIGH (ref 11.4–15.2)

## 2021-05-27 LAB — HEMOGLOBIN A1C
Hgb A1c MFr Bld: 6 % — ABNORMAL HIGH (ref 4.8–5.6)
Mean Plasma Glucose: 125.5 mg/dL

## 2021-05-27 LAB — GLUCOSE, CAPILLARY
Glucose-Capillary: 100 mg/dL — ABNORMAL HIGH (ref 70–99)
Glucose-Capillary: 117 mg/dL — ABNORMAL HIGH (ref 70–99)

## 2021-05-27 LAB — NO BLOOD PRODUCTS

## 2021-05-27 LAB — CBG MONITORING, ED: Glucose-Capillary: 100 mg/dL — ABNORMAL HIGH (ref 70–99)

## 2021-05-27 MED ORDER — FAMOTIDINE 20 MG PO TABS: 20.0000 mg | ORAL_TABLET | Freq: Two times a day (BID) | ORAL | Status: DC

## 2021-05-27 MED ORDER — ACETAMINOPHEN 650 MG RE SUPP: 650.0000 mg | Freq: Four times a day (QID) | RECTAL | Status: DC | PRN

## 2021-05-27 MED ORDER — CARVEDILOL 6.25 MG PO TABS
6.2500 mg | ORAL_TABLET | Freq: Two times a day (BID) | ORAL | Status: DC
  Administered 2021-05-27 – 2021-05-29 (×6): 6.25 mg via ORAL
  Filled 2021-05-27 (×3): qty 1
  Filled 2021-05-27: qty 2
  Filled 2021-05-27 (×2): qty 1

## 2021-05-27 MED ORDER — LACTATED RINGERS IV SOLN: INTRAVENOUS | Status: DC

## 2021-05-27 MED ORDER — CALCITRIOL 0.25 MCG PO CAPS
0.2500 ug | ORAL_CAPSULE | Freq: Every day | ORAL | Status: DC
  Administered 2021-05-27 – 2021-05-29 (×3): 0.25 ug via ORAL
  Filled 2021-05-27 (×3): qty 1

## 2021-05-27 MED ORDER — PANTOPRAZOLE SODIUM 40 MG PO TBEC
40.0000 mg | DELAYED_RELEASE_TABLET | Freq: Every day | ORAL | Status: DC
  Administered 2021-05-27 – 2021-05-29 (×3): 40 mg via ORAL
  Filled 2021-05-27 (×2): qty 1

## 2021-05-27 MED ORDER — HYDRALAZINE HCL 20 MG/ML IJ SOLN: 10.0000 mg | INTRAMUSCULAR | Status: DC | PRN

## 2021-05-27 MED ORDER — IPRATROPIUM-ALBUTEROL 0.5-2.5 (3) MG/3ML IN SOLN: 3.0000 mL | RESPIRATORY_TRACT | Status: DC | PRN

## 2021-05-27 MED ORDER — PHYTONADIONE 5 MG PO TABS
2.5000 mg | ORAL_TABLET | Freq: Once | ORAL | Status: AC
Start: 2021-05-27 — End: 2021-05-27
  Administered 2021-05-27: 01:00:00 2.5 mg via ORAL
  Filled 2021-05-27: qty 1

## 2021-05-27 MED ORDER — HYDROCORTISONE ACETATE 25 MG RE SUPP
25.0000 mg | Freq: Every day | RECTAL | Status: DC
  Administered 2021-05-27 – 2021-05-29 (×3): 25 mg via RECTAL
  Filled 2021-05-27 (×3): qty 1

## 2021-05-27 MED ORDER — SODIUM CHLORIDE 0.9 % IV SOLN
1.0000 g | INTRAVENOUS | Status: DC
  Administered 2021-05-27 – 2021-05-29 (×3): 1 g via INTRAVENOUS
  Filled 2021-05-27 (×3): qty 10

## 2021-05-27 MED ORDER — SENNOSIDES-DOCUSATE SODIUM 8.6-50 MG PO TABS: 1.0000 | ORAL_TABLET | Freq: Every evening | ORAL | Status: DC | PRN

## 2021-05-27 MED ORDER — METOPROLOL TARTRATE 5 MG/5ML IV SOLN: 5.0000 mg | INTRAVENOUS | Status: DC | PRN

## 2021-05-27 MED ORDER — LEVOTHYROXINE SODIUM 75 MCG PO TABS
150.0000 ug | ORAL_TABLET | Freq: Every day | ORAL | Status: DC
  Administered 2021-05-28 – 2021-05-29 (×2): 150 ug via ORAL
  Filled 2021-05-27 (×2): qty 2

## 2021-05-27 MED ORDER — INSULIN ASPART 100 UNIT/ML IJ SOLN
0.0000 [IU] | Freq: Three times a day (TID) | INTRAMUSCULAR | Status: DC
Start: 2021-05-27 — End: 2021-05-30
  Administered 2021-05-29: 1 [IU] via SUBCUTANEOUS
  Administered 2021-05-29: 2 [IU] via SUBCUTANEOUS

## 2021-05-27 MED ORDER — FAMOTIDINE 20 MG PO TABS
20.0000 mg | ORAL_TABLET | Freq: Every day | ORAL | Status: DC
  Administered 2021-05-27 – 2021-05-29 (×3): 20 mg via ORAL
  Filled 2021-05-27 (×4): qty 1

## 2021-05-27 MED ORDER — CHOLESTYRAMINE 4 G PO PACK
4.0000 g | PACK | ORAL | Status: DC
  Administered 2021-05-29: 4 g via ORAL
  Filled 2021-05-27: qty 1

## 2021-05-27 MED ORDER — ACETAMINOPHEN 325 MG PO TABS
650.0000 mg | ORAL_TABLET | Freq: Four times a day (QID) | ORAL | Status: DC | PRN
  Administered 2021-05-27 – 2021-05-29 (×5): 650 mg via ORAL
  Filled 2021-05-27 (×5): qty 2

## 2021-05-27 MED ORDER — TACROLIMUS 1 MG PO CAPS
2.5000 mg | ORAL_CAPSULE | Freq: Two times a day (BID) | ORAL | Status: DC
  Administered 2021-05-27 – 2021-05-29 (×5): 2.5 mg via ORAL
  Filled 2021-05-27 (×6): qty 1

## 2021-05-27 MED ORDER — PREDNISONE 5 MG PO TABS
5.0000 mg | ORAL_TABLET | Freq: Every day | ORAL | Status: DC
  Administered 2021-05-28 – 2021-05-29 (×2): 5 mg via ORAL
  Filled 2021-05-27 (×2): qty 1

## 2021-05-27 MED ORDER — DILTIAZEM HCL ER BEADS 240 MG PO CP24: 360.0000 mg | ORAL_CAPSULE | Freq: Every day | ORAL | Status: DC

## 2021-05-27 MED ORDER — DILTIAZEM HCL ER COATED BEADS 180 MG PO CP24
360.0000 mg | ORAL_CAPSULE | Freq: Every day | ORAL | Status: DC
  Administered 2021-05-27 – 2021-05-29 (×3): 360 mg via ORAL
  Filled 2021-05-27 (×2): qty 2
  Filled 2021-05-27: qty 1

## 2021-05-27 MED ORDER — SERTRALINE HCL 50 MG PO TABS
50.0000 mg | ORAL_TABLET | Freq: Every day | ORAL | Status: DC
  Administered 2021-05-27 – 2021-05-29 (×3): 50 mg via ORAL
  Filled 2021-05-27 (×2): qty 1

## 2021-05-27 MED ORDER — MIRTAZAPINE 30 MG PO TABS
30.0000 mg | ORAL_TABLET | Freq: Every day | ORAL | Status: DC
  Administered 2021-05-27 – 2021-05-29 (×3): 30 mg via ORAL
  Filled 2021-05-27 (×3): qty 1

## 2021-05-27 MED ORDER — TRAZODONE HCL 50 MG PO TABS
50.0000 mg | ORAL_TABLET | Freq: Every evening | ORAL | Status: DC | PRN
  Administered 2021-05-27: 50 mg via ORAL
  Filled 2021-05-27: qty 1

## 2021-05-27 MED ORDER — DONEPEZIL HCL 10 MG PO TABS
10.0000 mg | ORAL_TABLET | Freq: Every day | ORAL | Status: DC
  Administered 2021-05-27 – 2021-05-29 (×3): 10 mg via ORAL
  Filled 2021-05-27 (×3): qty 1

## 2021-05-27 NOTE — ED Notes (Signed)
Patient's daughter at bedside. Informed of risks of not getting blood or blood products. Patient refused due to religion. Patients daughter signed Refusal of all blood an/or blood products.

## 2021-05-27 NOTE — ED Notes (Signed)
Connie Ruiz daughter [phone] requesting an update on the patient

## 2021-05-27 NOTE — ED Notes (Signed)
Resting quietly. No complaints.

## 2021-05-27 NOTE — Progress Notes (Signed)
PROGRESS NOTE    Connie Ruiz  ZOX:[id number] DOB: 1961/04/03 DOA: 05/26/2021 PCP: [name], [name], MD   Brief Narrative:  60 year old with history of CVA with right-sided hemiparesis, DVT on Coumadin, kidney transplant, lupus, PE, expressive aphasia, DM2 presents from [facility] for evaluation of rectal bleeding.  He was noted to have supratherapeutic INR.  Also found to have acute kidney injury.  CT of the abdomen pelvis were negative.   Assessment & Plan:   Principal Problem:   AKI (acute kidney injury) ([place]) Active Problems:   Essential hypertension   LUPUS   KIDNEY TRANSPLANTATION, HX OF   Expressive aphasia   Supratherapeutic INR   Vascular dementia ([place])   Hemiparesis as late effect of cerebrovascular accident (CVA) ([place])   Type 2 diabetes mellitus (HCC)   Rectal bleeding   Anemia of chronic disease   Prolonged QT interval  Acute kidney injury - Baseline creatinine 1.0.  Admission creatinine 2.3.  Slowly improving with IV fluids.  Rectal bleeding Supratherapeutic INR - Hemoglobin stable, continue to monitor.  I have consulted GI this afternoon, [facility] PA. They will see the ptn. PPI PO daily  -Received a dose of vitamin K.  INR this morning 4.5  UTI -will order urine cultures, empiric Rocephin.   Anemia of chronic disease. -Stable.  Baseline around 9.0  History of DVT -On Coumadin, currently on hold  History of CVA with residual right-sided weakness and expressive aphasia -coumadin on hold.   Lupus History of kidney transplant -Confirmed with [facility] at [facility]: Tacrolimus 2.5mg  BID, Prednisone 5mg  po daily. Patient isnt on Cellcept.   Diabetes mellitus type 2 -A1c 6.0.  Accu-Cheks and sliding scale  Essential hypertension -coreg, cardizem, IV prn hydralazine and metoprolol   Vascular dementia -Supportive care.  On Zoloft  Hypothyroidism - Synthroid     DVT prophylaxis: SCDs Code Status:  Family Communication:  Daughter at  Bedside  Status is: Inpatient  Remains inpatient appropriate because: On going GI eval for LGIB complicated by Daily coumadin.   Subjective: Feeling ok during my visit, daughter is at the bedside.   Review of Systems Otherwise negative except as per HPI, including: General: Denies fever, chills, night sweats or unintended weight loss. Resp: Denies cough, wheezing, shortness of breath. Cardiac: Denies chest pain, palpitations, orthopnea, paroxysmal nocturnal dyspnea. GI: Denies abdominal pain, nausea, vomiting, diarrhea or constipation GU: Denies dysuria, frequency, hesitancy or incontinence MS: Denies muscle aches, joint pain or swelling Neuro: Denies headache, neurologic deficits (focal weakness, numbness, tingling), abnormal gait Psych: Denies anxiety, depression, SI/HI/AVH Skin: Denies new rashes or lesions ID: Denies sick contacts, exotic exposures, travel  Examination:  General exam: Appears calm and comfortable  Respiratory system: Clear to auscultation. Respiratory effort normal. Cardiovascular system: S1 & S2 heard, RRR. No JVD, murmurs, rubs, gallops or clicks. No pedal edema. Gastrointestinal system: Abdomen is nondistended, soft and nontender. No organomegaly or masses felt. Normal bowel sounds heard. Central nervous system: Alert and oriented. No focal neurological deficits. Extremities: Symmetric 5 x 5 power. Skin: No rashes, lesions or ulcers Psychiatry: Judgement and insight appear normal. Mood & affect appropriate.     Objective: Vitals:   05/27/21 0445 05/27/21 0530 05/27/21 0615 05/27/21 0630  BP: (!) 115/57 104/61 (!) 112/48 (!) 103/56  Pulse:    (!) 102  Resp:    18  Temp:      TempSrc:      SpO2:    96%    Intake/Output Summary (Last 24  hours) at 05/27/2021 0981 Last data filed at 05/26/2021 1949 Gross per 24 hour  Intake 1000 ml  Output --  Net 1000 ml   There were no vitals filed for this visit.   Data Reviewed:   CBC: Recent Labs   Lab 05/26/21 1813 05/26/21 1840 05/27/21 0620  WBC 9.5  --  8.9  NEUTROABS 4.9  --   --   HGB 10.8* 11.2* 9.9*  HCT 35.0* 33.0* 32.9*  MCV 88.2  --  88.9  PLT 270  --  191   Basic Metabolic Panel: Recent Labs  Lab 05/26/21 1813 05/26/21 1840 05/27/21 0620  NA 140 142 140  K 4.5 4.3 4.4  CL 107 106 109  CO2 27  --  25  GLUCOSE 92 90 88  BUN 39* 40* 37*  CREATININE 2.14* 2.30* 1.89*  CALCIUM 9.3  --  8.6*   GFR: CrCl cannot be calculated (Unknown ideal weight.). Liver Function Tests: Recent Labs  Lab 05/26/21 1813  AST 12*  ALT 9  ALKPHOS 59  BILITOT 0.5  PROT 5.1*  ALBUMIN 2.2*   Recent Labs  Lab 05/26/21 1813  LIPASE 21   No results for input(s): AMMONIA in the last 168 hours. Coagulation Profile: Recent Labs  Lab 05/26/21 1813 05/27/21 0620  INR 5.0* 4.5*   Cardiac Enzymes: No results for input(s): CKTOTAL, CKMB, CKMBINDEX, TROPONINI in the last 168 hours. BNP (last 3 results) No results for input(s): PROBNP in the last 8760 hours. HbA1C: Recent Labs    05/27/21 0620  HGBA1C 6.0*   CBG: No results for input(s): GLUCAP in the last 168 hours. Lipid Profile: No results for input(s): CHOL, HDL, LDLCALC, TRIG, CHOLHDL, LDLDIRECT in the last 72 hours. Thyroid Function Tests: No results for input(s): TSH, T4TOTAL, FREET4, T3FREE, THYROIDAB in the last 72 hours. Anemia Panel: No results for input(s): VITAMINB12, FOLATE, FERRITIN, TIBC, IRON, RETICCTPCT in the last 72 hours. Sepsis Labs: Recent Labs  Lab 05/26/21 1813  LATICACIDVEN 1.2    Recent Results (from the past 240 hour(s))  Resp Panel by RT-PCR (Flu A&B, Covid) Nasopharyngeal Swab     Status: None   Collection Time: 05/26/21  2:35 PM   Specimen: Nasopharyngeal Swab; Nasopharyngeal(NP) swabs in vial transport medium  Result Value Ref Range Status   SARS Coronavirus 2 by RT PCR NEGATIVE NEGATIVE Final    Comment: (NOTE) SARS-CoV-2 target nucleic acids are NOT DETECTED.  The  SARS-CoV-2 RNA is generally detectable in upper respiratory specimens during the acute phase of infection. The lowest concentration of SARS-CoV-2 viral copies this assay can detect is 138 copies/mL. A negative result does not preclude SARS-Cov-2 infection and should not be used as the sole basis for treatment or other patient management decisions. A negative result may occur with  improper specimen collection/handling, submission of specimen other than nasopharyngeal swab, presence of viral mutation(s) within the areas targeted by this assay, and inadequate number of viral copies(<138 copies/mL). A negative result must be combined with clinical observations, patient history, and epidemiological information. The expected result is Negative.  Fact Sheet for Patients:  EntrepreneurPulse.com.au  Fact Sheet for Healthcare Providers:  IncredibleEmployment.be  This test is no t yet approved or cleared by the [place] FDA and  has been authorized for detection and/or diagnosis of SARS-CoV-2 by FDA under an Emergency Use Authorization (EUA). This EUA will remain  in effect (meaning this test can be used) for the duration of the COVID-19 declaration under Section 564(b)(1) of  the Act, 21 U.S.C.section 360bbb-3(b)(1), unless the authorization is terminated  or revoked sooner.       Influenza A by PCR NEGATIVE NEGATIVE Final   Influenza B by PCR NEGATIVE NEGATIVE Final    Comment: (NOTE) The Xpert Xpress SARS-CoV-2/FLU/RSV plus assay is intended as an aid in the diagnosis of influenza from Nasopharyngeal swab specimens and should not be used as a sole basis for treatment. Nasal washings and aspirates are unacceptable for Xpert Xpress SARS-CoV-2/FLU/RSV testing.  Fact Sheet for Patients: EntrepreneurPulse.com.au  Fact Sheet for Healthcare Providers: IncredibleEmployment.be  This test is not yet approved or  cleared by the [place] FDA and has been authorized for detection and/or diagnosis of SARS-CoV-2 by FDA under an Emergency Use Authorization (EUA). This EUA will remain in effect (meaning this test can be used) for the duration of the COVID-19 declaration under Section 564(b)(1) of the Act, 21 U.S.C. section 360bbb-3(b)(1), unless the authorization is terminated or revoked.  Performed at [facility], [place] [address] [place], [place] [address]          Radiology Studies: No results found.      Scheduled Meds:  calcitRIOL  0.25 mcg Oral Daily   famotidine  20 mg Oral Daily   insulin aspart  0-9 Units Subcutaneous TID WC & HS   levothyroxine  150 mcg Oral Q0600   sertraline  50 mg Oral Daily   Continuous Infusions:  lactated ringers 75 mL/hr at 05/27/21 0105     LOS: 1 day   Time spent= 35 mins    Jaquon Gingerich Arsenio Loader, MD [facility]  If 7PM-7AM, please contact night-coverage  05/27/2021, 8:39 AM

## 2021-05-27 NOTE — Consult Note (Signed)
° °  [facility] CM Inpatient Consult   05/27/2021  Connie Ruiz 21-Nov-1960 [id number]  [employer] [ACO] Patient: [employer]  Primary Care Provider:  [name], [name], MD [facility], daughter states patient was active prior to having her PCP at [facility]  Patient screened for hospitalization with noted extreme high risk score for unplanned readmission risk and to assess for potential [facility] service needs for post hospital transition.  Review of patient's medical record reveals patient is being recommended to return to a skilled nursing level of care, long term care  Met with patient and daughter at bedside.  Patient opened eyes and briefly acknowledge my presence. Spoke with daughter Connie Ruiz at bedside regarding post hospital follow up needs.  Daughter states she is looking for continued SNF.   Plan:  Will sign off at transition from [facility] as her care needs are being recommended at a skilled nursing level of care in a facility. For questions contact:   Natividad Brood, RN BSN [facility] Liaison  [phone] business mobile phone Toll free office [phone]  Fax number: [phone] [email or web address]

## 2021-05-27 NOTE — Consult Note (Signed)
[facility] Consult: 1:45 PM 05/27/2021  LOS: 1 day    Referring Provider: Dr Tonie Griffith  Primary Care Physician:  [name], [name], MD Primary Gastroenterologist:  [facility] Medal    Reason for Consultation:  hematochezia.     HPI: Connie Ruiz is a 60 y.o. female.  SNF resident, nonambulatory.  PMH stroke.  Pulmonary embolus.  On chronic Coumadin.  CHF.  Renal failure.  Kidney transplant.  SLE.  Discoid Lupus.  Goiter.    Surgeries include but not limited to C-section, cholecystectomy, kidney transplant, tubal ligation Issues includes Clostridium difficile colitis.  Latest treatment for C. diff with vancomycin was summer 2022. 02/04/2021 flexible sigmoidoscopy.  Dr. [name].  For diarrhea.  There was edematous mucosa with loss of vascular markings throughout the colon, random biopsies obtained.  No pseudomembranes or active colitis. 11/2014 SBE.  Dr Fuller Plan.  To investigate abnormal appearance of small bowel with jejunitis on CT scan.  White, esophageal exudates C/W candidiasis.  Small HH.  Otherwise normal study to proximal duodenum.  Biopsies of jejunum benign.  Gastric biopsies showed mild, scattered, chronic inactive inflammation favoring polypoid foveolar hyperplasia, no H. pylori. 06/2000 colonoscopy.  For evaluation abdominal pain, hematochezia.  Entirely normal mucosa, no bleeding, masses, polyps. Pathology of random bx: Benign colonic mucosa, no inflammation.  No microscopic colitis, no dysplasia, no malignancy.  Chronic diarrhea, fecal urgency, generalized abdominal pain and bloating.  This has not responded to Dificid.  Some suspicion that CellCept may be because of the issue.  [facility] office visit 02/2021.  Plan was to check fecal calprotectin, lactoferrin, pancreatic elastase, occult blood,  celiac serologies, gastrin, chromogranin, 5 HIAA.  However, labs were never drawn. Consideration of trial of Questran vs alosetron though Questran problematic given the multiple essential meds patient takes. A CTAP wo contrast 03/13/21: No acute abdominal or pelvic abnormalities.  Redemonstration of hyper attenuating left kidney lesion and cystic right kidney lesion.  Passing BPR since at least yesterday, w ongoing mid abd pain.  Nausea, no emesis.  Anorexia/diminished appetite for last few to several weeks.  INR 12/20 5, Coumadin held, last dose 12/19.   Hgb 11.2 ... 9.9, was between 10 and 11.7 in September 2022.  MCV 88. INR 5 .. 4.5.   Creat 2.3 >> 1.8 over last 24 h.  Normal in August and September.   Heart rate as high as 114.  Blood pressures soft, low 100s/50s to 60s.    Past Medical History:  Diagnosis Date   Anxiety    Candida esophagitis ([place]) 11/12/2014   CEREBROVASCULAR ACCIDENT, ACUTE 04/15/2010   CLOSTRIDIUM DIFFICILE COLITIS, HX OF 08/21/2007   CONGESTIVE HEART FAILURE 08/21/2007   Current use of long term anticoagulation    Dr. Andree Elk, [facility]   CVA 04/17/2010   Depression    Dr. Andree Elk, [place], TYPE II 08/21/2007   DVT, HX OF 08/21/2007   GERD 08/21/2007   GOUT 08/21/2007   History of stroke with residual effects    HYPERLIPIDEMIA 08/21/2007   HYPERTENSION  08/21/2007   Dr. Andree Elk, [place], HX OF 08/22/2007   s/p renal transplant-Dr. Bonney Leitz   LUPUS 08/21/2007   OSTEOPOROSIS 08/21/2007   Rheumatol at [facility]   Pulmonary embolism ([place]) 07/16/2010   Renal failure    RENAL INSUFFICIENCY 08/21/2007   Right sided weakness    Steroid-induced hyperglycemia 11/09/2014   Tachycardia    THYROID NODULE, LEFT 04/10/2009    Past Surgical History:  Procedure Laterality Date   BIOPSY  02/04/2021   Procedure: BIOPSY;  Surgeon: [name], MD;  Location: [facility];  Service: Endoscopy;;   CESAREAN SECTION     CHOLECYSTECTOMY      COLONOSCOPY  06/[facility]   [facility]    ENTEROSCOPY N/A 11/11/2014   Procedure: ENTEROSCOPY;  Surgeon: Ladene Artist, MD;  Location: [facility];  Service: Endoscopy;  Laterality: N/A;   FLEXIBLE SIGMOIDOSCOPY N/A 02/04/2021   Procedure: FLEXIBLE SIGMOIDOSCOPY;  Surgeon: [name], MD;  Location: [facility];  Service: Endoscopy;  Laterality: N/A;   KIDNEY TRANSPLANT Right 2009   RENAL BIOPSY, OPEN  1981   TUBAL LIGATION      Prior to Admission medications   Medication Sig Start Date End Date Taking? Authorizing Provider  acetaminophen (TYLENOL) 500 MG tablet Take 1,000 mg by mouth every 6 (six) hours as needed for moderate pain.   Yes [provider]  alendronate (FOSAMAX) 70 MG tablet Take 70 mg by mouth once a week. Take with a full glass of water on an empty stomach on Saturdays.   Yes [provider]  calcitRIOL (ROCALTROL) 0.25 MCG capsule TAKE 1 CAPSULE(0.25 MCG) BY MOUTH DAILY. NO REFILLS WITHOUT APPOINTMENT Patient taking differently: Take 0.25 mcg by mouth daily. 02/13/19  Yes Renato Shin, MD  carvedilol (COREG) 6.25 MG tablet Take 1 tablet (6.25 mg total) by mouth 2 (two) times daily with a meal. 02/13/21  Yes Little Ishikawa, MD  cholecalciferol (VITAMIN D3) 25 MCG (1000 UNIT) tablet Take 1,000 Units by mouth daily.   Yes [provider]  dicyclomine (BENTYL) 10 MG capsule Take 1 capsule (10 mg total) by mouth 3 (three) times daily before meals. 02/13/21  Yes Little Ishikawa, MD  diltiazem [facility]) 360 MG 24 hr capsule Take 1 capsule (360 mg total) by mouth daily. 06/27/19  Yes [name], [name], MD  donepezil (ARICEPT) 10 MG tablet Take 10 mg by mouth at bedtime.  11/18/16  Yes [provider]  esomeprazole (NEXIUM) 20 MG capsule TAKE 1 CAPSULE(20 MG) BY MOUTH DAILY Patient taking differently: Take 20 mg by mouth daily at 12 noon. 07/17/18  Yes Renato Shin, MD  famotidine (PEPCID) 20 MG tablet Take 20 mg by mouth 2 (two) times daily.   Yes  [provider]  furosemide (LASIX) 20 MG tablet Take 20 mg by mouth daily.   Yes [provider]  glucose blood (ONE TOUCH ULTRA TEST) test strip Use to check blood sugar 1 time per day 02/12/15  Yes Renato Shin, MD  insulin lispro (HUMALOG) 100 UNIT/ML injection Inject 0-12 Units into the skin 3 (three) times daily before meals. Per Sliding Scale. If blood sugar is: 0-150: 0 units 151-200: 2 units 201-250: 4 units 251-300: 6 units 301-350: 8 units 351-400: 10 units 401-450: 12 units   Yes [provider]  levothyroxine (SYNTHROID) 150 MCG tablet Take 1 tablet (150 mcg total) by mouth daily at 6 (six) AM. 02/14/21  Yes Little Ishikawa, MD  loperamide (IMODIUM) 2  MG capsule Take 2 mg by mouth every 6 (six) hours as needed for diarrhea or loose stools.   Yes [provider]  magnesium oxide (MAG-OX) 400 MG tablet Take 800 mg by mouth daily.  01/11/17  Yes [provider]  mirtazapine (REMERON) 30 MG tablet Take 1 tablet (30 mg total) by mouth at bedtime. 07/28/15  Yes Jaffe, Adam R, DO  PSYLLIUM PO Take 1 packet by mouth daily. 3g/5.4g: 2tsp -mix 1sp in [place] of water qd   Yes [provider]  sertraline (ZOLOFT) 50 MG tablet Take 50 mg by mouth daily. 05/12/21  Yes [provider]  Skin Protectants, Misc. (EUCERIN) cream Apply 1 application topically daily. To feet   Yes [provider]  sucralfate (CARAFATE) 1 g tablet Take 1 tablet (1 g total) by mouth 4 (four) times daily for 30 days. 08/22/18 05/26/21 Yes Pokhrel, Laxman, MD  tacrolimus (PROGRAF) 0.5 MG capsule Take 0.5 mg by mouth See admin instructions. Takes 0.33m cap with 2 179mTacrolimus to equal a total 2.86m48mvery 12 hours 04/17/21  Yes [provider]  tacrolimus (PROGRAF) 1 MG capsule Take 2 mg by mouth See admin instructions. Takes 2 1mg74mpsules with Tacrolimus 0.86mg 31msules to = 2.86mg e73my 12 hours 01/15/18  Yes [provider]  Blood Glucose  Monitoring Suppl (ACCU-CHEK AVIVA PLUS) w/Device KIT 1 Device by Does not apply route daily. 10/13/18   JordanMartiniquey G, MD  ondansetron (ZOFRAN ODT) 4 MG disintegrating tablet 4mg OD486m4 hours prn nausea/vomiting Patient taking differently: Take 4 mg by mouth every 4 (four) hours as needed for nausea or vomiting. 06/15/15   Mesner, Jason, Corene CorneaNETOUCDepartment Of [facility] LANCETS 33G [id number] to check blood sugar 1 time per day 02/12/15   EllisonRenato Shinarfarin (COUMADIN) 1 MG tablet Take 0.5 mg by mouth See admin instructions. Takes 1/2 tab with 3mg tab92m equal a total of 3.86mg ever100mther day. Patient not taking: Reported on 05/26/2021    [provider]  warfarin (COUMADIN) 3 MG tablet Take 3 mg by mouth See admin instructions. Takes 1 3mg tab w36m 1/2 of a 1mg tab to28mual a total of 3.86mg every o81mr day Patient not taking: Reported on 05/26/2021    [provider]  warfarin (COUMADIN) 4 MG tablet Take by mouth. Patient not taking: Reported on 05/26/2021 05/18/21   [provider]    Scheduled Meds:  calcitRIOL  0.25 mcg Oral Daily   carvedilol  6.25 mg Oral BID WC   diltiazem  360 mg Oral Daily   donepezil  10 mg Oral QHS   famotidine  20 mg Oral Daily   insulin aspart  0-9 Units Subcutaneous TID WC & HS   levothyroxine  150 mcg Oral Q0600   mirtazapine  30 mg Oral QHS   pantoprazole  40 mg Oral Daily   [START ON 05/28/2021] predniSONE  5 mg Oral Q breakfast   sertraline  50 mg Oral Daily   tacrolimus  2.5 mg Oral BID   Infusions:  cefTRIAXone (ROCEPHIN)  IV 1 g (05/27/21 1151)   lactated ringers 75 mL/hr at 05/27/21 0105   PRN Meds: acetaminophen **OR** acetaminophen, hydrALAZINE, ipratropium-albuterol, metoprolol tartrate, senna-docusate, traZODone   Allergies as of 05/26/2021 - Review Complete 05/26/2021  Allergen Reaction Noted   Oxycodone-acetaminophen Shortness Of Breath and Nausea Only    Propoxyphene Nausea Only and Shortness Of Breath 04/04/2014    Propoxyphene n-acetaminophen Shortness Of  Breath and Nausea Only    Sulfonamide derivatives Shortness Of Breath and Nausea Only    Codeine Nausea Only 10/21/2009   Hydrocodone-acetaminophen  04/13/2011   Hydromorphone  04/13/2011   Other Other (See Comments) 03/27/2015   Oxycodone  04/13/2011   Sulfamethoxazole  04/13/2011   Tape  04/26/2012   Gabapentin Anxiety 03/27/2015   Latex Rash 06/17/2011   Metoprolol Rash 08/31/2013   Morphine Rash 11/15/2014   Morphine and related Rash 02/14/2015   Rosiglitazone Rash 04/13/2011    Family History  Problem Relation Age of Onset   Heart attack Mother    Heart disease Father    Asthma Sister    Asthma Daughter    Cancer Maternal Grandfather        prostate   Cancer Paternal Grandfather        colon    Social History   Socioeconomic History   Marital status: Divorced    Spouse name: Not on file   Number of children: 1   Years of education: Not on file   Highest education level: Not on file  Occupational History   Occupation: DISABILITY    Employer: UNEMPLOYED  Tobacco Use   Smoking status: Never   Smokeless tobacco: Never  Vaping Use   Vaping Use: Never used  Substance and Sexual Activity   Alcohol use: No    Alcohol/week: 0.0 standard drinks   Drug use: No   Sexual activity: Not Currently    Partners: Male    Birth control/protection: Post-menopausal  Other Topics Concern   Not on file  Social History Narrative   Retired   Regular exercise-no   Pt completed hs.    Social Determinants of Health   Financial Resource Strain: Not on file  Food Insecurity: Not on file  Transportation Needs: Not on file  Physical Activity: Not on file  Stress: Not on file  Social Connections: Not on file  Intimate Partner Violence: Not on file    REVIEW OF SYSTEMS: Constitutional: Increasing weakness over the last few weeks. ENT:  No nose bleeds Pulm: Denies shortness of breath.  No cough. CV:  No palpitations, no LE edema.   No angina. GU:  No hematuria, no frequency GI: See HPI. Heme: Blood per rectum, otherwise no unusual or excessive bleeding or bruising Transfusions: Last PRBC appears to have been a single unit in April 2021 Neuro:  No headaches, no peripheral tingling or numbness.  No syncope, no seizure Derm: Pressure injury and wounds in the sacrum. Endocrine:  No sweats or chills.  No polyuria or dysuria Immunization: Reviewed Travel:  None beyond local counties in last few months.    PHYSICAL EXAM: Vital signs in last 24 hours: Vitals:   05/27/21 1000 05/27/21 1200  BP: (!) 113/55 103/62  Pulse: 80 100  Resp: 20 20  Temp: 97.9 F (36.6 C) 98 F (36.7 C)  SpO2: 93% 94%   Wt Readings from Last 3 Encounters:  02/13/21 94.8 kg  08/21/20 88.9 kg  09/11/19 78.9 kg    General: Extremely chronically ill-appearing, lying comfortably on the bed. Head: No facial asymmetry or swelling.  No signs of head trauma. Eyes: Conjunctiva somewhat pale.  Exophthalmos. Ears: Not hard of hearing Nose: No discharge or congestion Mouth: Tongue midline.  Oral mucosa moist, pink, clear. Neck: No JVD, no masses, no thyromegaly Lungs: Clear to auscultation in front bilaterally.  No labored breathing or cough. Heart: RRR.  Current rate in the 90s. Abdomen: Obese with  large pannus out of proportion to limbs.  Bowel sounds normal quality but hypoactive.  No HSM, masses, bruits, hernias.  Diffuse tenderness without guarding or rebound.   Rectal: Did not perform.  There are pressure injuries, ulcer x 1 in the sacral region.  Dr. Lavone Orn in ED noted scant maroon blood around anus, no obvious fissures.  Also reported was small amount of blood in the diaper. Musc/Skeltl: Overall muscle wasting of limbs. Extremities: Slight, nonpitting pedal edema. Neurologic: Answers appropriately.  Follows commands.  Expressive aphasia.  Weakness on right upper/lower extremity, downgoing right foot Skin: Patches of dry skin on the legs.   Sacral pressure injury and 1 shallow ulcer Nodes: No cervical adenopathy Psych: Calm, cooperative.  Intake/Output from previous day: 12/20 0701 - 12/21 0700 In: 1000 [IV Piggyback:1000] Out: -  Intake/Output this shift: No intake/output data recorded.  LAB RESULTS: Recent Labs    05/26/21 1813 05/26/21 1840 05/27/21 0620  WBC 9.5  --  8.9  HGB 10.8* 11.2* 9.9*  HCT 35.0* 33.0* 32.9*  PLT 270  --  244   BMET Lab Results  Component Value Date   NA 140 05/27/2021   NA 142 05/26/2021   NA 140 05/26/2021   K 4.4 05/27/2021   K 4.3 05/26/2021   K 4.5 05/26/2021   CL 109 05/27/2021   CL 106 05/26/2021   CL 107 05/26/2021   CO2 25 05/27/2021   CO2 27 05/26/2021   CO2 26 02/16/2021   GLUCOSE 88 05/27/2021   GLUCOSE 90 05/26/2021   GLUCOSE 92 05/26/2021   BUN 37 (H) 05/27/2021   BUN 40 (H) 05/26/2021   BUN 39 (H) 05/26/2021   CREATININE 1.89 (H) 05/27/2021   CREATININE 2.30 (H) 05/26/2021   CREATININE 2.14 (H) 05/26/2021   CALCIUM 8.6 (L) 05/27/2021   CALCIUM 9.3 05/26/2021   CALCIUM 8.5 (L) 02/16/2021   LFT Recent Labs    05/26/21 1813  PROT 5.1*  ALBUMIN 2.2*  AST 12*  ALT 9  ALKPHOS 59  BILITOT 0.5   PT/INR Lab Results  Component Value Date   INR 4.5 (HH) 05/27/2021   INR 5.0 (HH) 05/26/2021   INR 1.8 (H) 02/16/2021   Hepatitis Panel No results for input(s): HEPBSAG, HCVAB, HEPAIGM, HEPBIGM in the last 72 hours. C-Diff No components found for: CDIFF Lipase     Component Value Date/Time   LIPASE 21 05/26/2021 1813    Drugs of Abuse     Component Value Date/Time   LABOPIA NONE DETECTED 01/26/2021 2250   COCAINSCRNUR NONE DETECTED 01/26/2021 2250   LABBENZ NONE DETECTED 01/26/2021 2250   AMPHETMU NONE DETECTED 01/26/2021 2250   THCU NONE DETECTED 01/26/2021 2250   LABBARB NONE DETECTED 01/26/2021 2250     RADIOLOGY STUDIES: No results found.   IMPRESSION:      Bleeding per rectum in patient with chronic diarrhea and abdominal  distress/pain dating back years.  Had flex sig in August 2022 with benign random colon biopsies.  Previous history C. difficile.  Last full colonoscopy was in 2002 for similar presentation, hematochezia and pain this study was entirely normal.    Chronic Coumadin for hx DVT/PE, CVA.  INR 12/20: 5.  Held w last dose 12/19    S/p renal transplant ~ 2005.  AKI is improving.  Baseline GFR >60.     PLAN:       Per Dr Rush Landmark.     Azucena Freed  05/27/2021, 1:45 PM Phone [phone]

## 2021-05-27 NOTE — ED Notes (Signed)
Reported abnormal lab to assigned RN

## 2021-05-27 NOTE — ED Notes (Signed)
Diaper soiled with urine. No rectal bleeding noted. Multiple dressings in place to buttocks noted. Skin cleansed and patted dry. Purewick placed to low wall suction to prevent further skin breakdown.

## 2021-05-28 LAB — MAGNESIUM: Magnesium: 1.8 mg/dL (ref 1.7–2.4)

## 2021-05-28 LAB — PROTIME-INR
INR: 2.2 — ABNORMAL HIGH (ref 0.8–1.2)
Prothrombin Time: 24.6 seconds — ABNORMAL HIGH (ref 11.4–15.2)

## 2021-05-28 LAB — CBC
HCT: 31.6 % — ABNORMAL LOW (ref 36.0–46.0)
Hemoglobin: 9.9 g/dL — ABNORMAL LOW (ref 12.0–15.0)
MCH: 27.3 pg (ref 26.0–34.0)
MCHC: 31.3 g/dL (ref 30.0–36.0)
MCV: 87.3 fL (ref 80.0–100.0)
Platelets: 208 10*3/uL (ref 150–400)
RBC: 3.62 MIL/uL — ABNORMAL LOW (ref 3.87–5.11)
RDW: 19.2 % — ABNORMAL HIGH (ref 11.5–15.5)
WBC: 8.5 10*3/uL (ref 4.0–10.5)
nRBC: 0 % (ref 0.0–0.2)

## 2021-05-28 LAB — URINE CULTURE

## 2021-05-28 LAB — BASIC METABOLIC PANEL
Anion gap: 6 (ref 5–15)
BUN: 32 mg/dL — ABNORMAL HIGH (ref 6–20)
CO2: 23 mmol/L (ref 22–32)
Calcium: 8.8 mg/dL — ABNORMAL LOW (ref 8.9–10.3)
Chloride: 111 mmol/L (ref 98–111)
Creatinine, Ser: 1.6 mg/dL — ABNORMAL HIGH (ref 0.44–1.00)
GFR, Estimated: 37 mL/min — ABNORMAL LOW (ref >60)
Glucose, Bld: 93 mg/dL (ref 70–99)
Potassium: 4.3 mmol/L (ref 3.5–5.1)
Sodium: 140 mmol/L (ref 135–145)

## 2021-05-28 LAB — GLUCOSE, CAPILLARY
Glucose-Capillary: 73 mg/dL (ref 70–99)
Glucose-Capillary: 116 mg/dL — ABNORMAL HIGH (ref 70–99)
Glucose-Capillary: 119 mg/dL — ABNORMAL HIGH (ref 70–99)
Glucose-Capillary: 125 mg/dL — ABNORMAL HIGH (ref 70–99)

## 2021-05-28 MED ORDER — SODIUM CHLORIDE 0.9 % IV SOLN: INTRAVENOUS | Status: AC

## 2021-05-28 MED ORDER — WARFARIN SODIUM 3 MG PO TABS
3.0000 mg | ORAL_TABLET | Freq: Once | ORAL | Status: AC
  Administered 2021-05-28: 3 mg via ORAL
  Filled 2021-05-28 (×2): qty 1

## 2021-05-28 MED ORDER — WARFARIN - PHARMACIST DOSING INPATIENT: Freq: Every day | Status: DC

## 2021-05-28 MED ORDER — NYSTATIN 100000 UNIT/GM EX POWD
Freq: Two times a day (BID) | CUTANEOUS | Status: DC
  Filled 2021-05-28: qty 15

## 2021-05-28 NOTE — Progress Notes (Signed)
OT Evaluation: Pt from [facility], reports not receiving therapy services and requiring some assist for ADLs in bed but able to roll without assist.  Pt currently requires mod assist to roll in bed and up to total assist for LB ADLs.  She is limited by abdominal pain, weakness (chronic R sided deficits from CVA).  Believe she will benefit from continued OT services to progress strength for rolling in bed and ADLs, but recommend long term care at SNF after dc.  Will follow.     05/28/21 1100  OT Visit Information  Last OT Received On 05/28/21  Assistance Needed +1 (bed level)  PT/OT/SLP Co-Evaluation/Treatment Yes  Reason for Co-Treatment Complexity of the patient's impairments (multi-system involvement)  OT goals addressed during session ADL's and self-care  History of Present Illness 60 y.o. female presents to [facility] hospital on 05/26/2021 with rectal bleeding, UTI, and AKI. PMH includes CVA with right-sided hemiparesis, DVT on Coumadin, kidney transplant, lupus, PE, expressive aphasia, DM2.  Precautions  Precautions Fall  Precaution Comments residual R weakness from prior CVA  Restrictions  Weight Bearing Restrictions No  Home Living  Family/patient expects to be discharged to: Skilled nursing facility  Additional [place]  Prior Function  Prior Level of Function  Needs assist  Mobility Comments reports bed bound  ADLs Comments reports bed level ADLs with some assist, but able to groom, feed and complete UB dressing  Communication  Communication Expressive difficulties  Pain Assessment  Pain Assessment Faces  Faces Pain Scale 4  Pain Location stomach  Pain Descriptors / Indicators Discomfort;Grimacing  Pain Intervention(s) Limited activity within patient's tolerance;Monitored during session;Repositioned  Cognition  Arousal/Alertness Awake/alert  Behavior During Therapy Flat affect  Overall Cognitive Status History of cognitive impairments - at baseline  General  Comments hx of expressive aphasia and requires increased time to express self; follow commands with poor problem solving.  anticipate baseline  Upper Extremity Assessment  Upper Extremity Assessment RUE deficits/detail  RUE Deficits / Details weakness from CVA, AROM WFL  RUE Coordination decreased fine motor;decreased gross motor  Lower Extremity Assessment  Lower Extremity Assessment Defer to PT evaluation  Cervical / Trunk Assessment  Cervical / Trunk Assessment Normal  Vision- Assessment  Vision Assessment? No apparent visual deficits  ADL  Overall ADL's  Needs assistance/impaired  Grooming Minimal assistance;Bed level  Upper Body Bathing Minimal assistance;Bed level  Upper Body Dressing  Moderate assistance;Bed level  Lower Body Dressing Total assistance;Bed level  Toilet Transfer Details (indicate cue type and reason) unable  Functional mobility during ADLs Moderate assistance  General ADL Comments limited to bed level  Bed Mobility  Overal bed mobility Needs Assistance  Bed Mobility Rolling  Rolling Mod assist  General bed mobility comments declines further mobility after attempting to roll to each side  General Comments  General comments (skin integrity, edema, etc.) VSS on RA  OT - End of Session  Activity Tolerance Patient tolerated treatment well  Patient left in bed;with call bell/phone within reach  Nurse Communication Mobility status  OT Assessment  OT Recommendation/Assessment Patient needs continued OT Services  OT Visit Diagnosis Muscle weakness (generalized) (M62.81)  OT Problem List Decreased strength;Decreased activity tolerance;Obesity  OT Plan  OT Frequency (ACUTE ONLY) Min 2X/week  OT Treatment/Interventions (ACUTE ONLY) Self-care/ADL training;Therapeutic exercise;Therapeutic activities;Patient/family education  AM-PAC OT "6 Clicks" Daily Activity Outcome Measure (Version 2)  Help from another person eating meals? 3  Help from another person taking care  of personal grooming? 3  Help from another person toileting, which includes using toliet, bedpan, or urinal? 1  Help from another person bathing (including washing, rinsing, drying)? 2  Help from another person to put on and taking off regular upper body clothing? 2  Help from another person to put on and taking off regular lower body clothing? 1  6 Click Score 12  Progressive Mobility  What is the highest level of mobility based on the progressive mobility assessment? Level 1 (Bedfast) - Unable to balance while sitting on edge of bed  Mobility Sit up in bed/chair position for meals  OT Recommendation  Follow Up Recommendations Long-term institutional care without follow-up therapy  Assistance recommended at discharge Frequent or constant Supervision/Assistance  Functional Status Assessent Patient has had a recent decline in their functional status and/or demonstrates limited ability to make significant improvements in function in a reasonable and predictable amount of time  OT Equipment None recommended by OT  Individuals Consulted  Consulted and Agree with Results and Recommendations Patient  Acute Rehab OT Goals  Patient Stated Goal to rest  OT Goal Formulation With patient  Time For Goal Achievement 06/11/21  Potential to Achieve Goals Good  OT Time Calculation  OT Start Time (ACUTE ONLY) 1116  OT Stop Time (ACUTE ONLY) 1128  OT Time Calculation (min) 12 min  OT General Charges  $OT Visit 1 Visit  OT Evaluation  $OT Eval Moderate Complexity 1 Mod  Written Expression  Dominant Hand Left    Jolaine Artist, OT Acute Rehabilitation Services Pager [phone] Office [phone]

## 2021-05-28 NOTE — Evaluation (Signed)
Physical Therapy Evaluation Patient Details Name: Connie Ruiz MRN: [id number] DOB: 12-02-1960 Today's Date: 05/28/2021  History of Present Illness  60 y.o. female presents to [facility] hospital on 05/26/2021 with rectal bleeding, UTI, and AKI. PMH includes CVA with right-sided hemiparesis, DVT on Coumadin, kidney transplant, lupus, PE, expressive aphasia, DM2.  Clinical Impression  Pt presents to PT with deficits in strength, ROM, functional mobility. Pt reports remaining in the bed at all times at her skilled nursing facility, not participating in PT services at baseline, however she does report being able to roll independently to aide in bathing and repositioning. Pt currently requires physical assistance to roll due to weakness and impaired UE ROM. Pt will benefit from continued acute PT services to aide in improving rolling quality, in hopes of aiding in reducing the risk of pressure injuries in the future.       Recommendations for follow up therapy are one component of a multi-disciplinary discharge planning process, led by the attending physician.  Recommendations may be updated based on patient status, additional functional criteria and insurance authorization.  Follow Up Recommendations Long-term institutional care without follow-up therapy    Assistance Recommended at Discharge Intermittent Supervision/Assistance  Functional Status Assessment Patient has had a recent decline in their functional status and demonstrates the ability to make significant improvements in function in a reasonable and predictable amount of time.  Equipment Recommendations  None recommended by PT (facility owns necessary DME)    Recommendations for Other Services       Precautions / Restrictions Precautions Precautions: Fall Precaution Comments: residual R weakness from prior CVA Restrictions Weight Bearing Restrictions: No      Mobility  Bed Mobility Overal bed mobility: Needs Assistance Bed Mobility:  Rolling Rolling: Mod assist         General bed mobility comments: pt declines further mobility after attempting to roll to each side    Transfers                        Ambulation/Gait                  Stairs            Wheelchair Mobility    Modified Rankin (Stroke Patients Only)       Balance                                             Pertinent Vitals/Pain Pain Assessment: Faces Faces Pain Scale: Hurts little more Pain Location: abdomen Pain Descriptors / Indicators: Grimacing Pain Intervention(s): Monitored during session    Home Living Family/patient expects to be discharged to:: Skilled nursing facility                   Additional Comments: [place]    Prior Function Prior Level of Function : Needs assist             Mobility Comments: pt reports not leaving bed at facility ADLs Comments: reports bed level ADLs with some assist, but able to groom, feed and complete UB dressing     Hand Dominance   Dominant Hand: Left    Extremity/Trunk Assessment   Upper Extremity Assessment Upper Extremity Assessment: Defer to OT evaluation RUE Deficits / Details: weakness from CVA, AROM WFL RUE Coordination: decreased fine motor;decreased gross motor  Lower Extremity Assessment Lower Extremity Assessment: Generalized weakness (3/5 ankle PF/DF, 3-/5 hip and flexion and knee extension (unable to perform SLR with either lower extremity))    Cervical / Trunk Assessment Cervical / Trunk Assessment: Normal  Communication   Communication: Expressive difficulties  Cognition Arousal/Alertness: Awake/alert Behavior During Therapy: Flat affect Overall Cognitive Status: History of cognitive impairments - at baseline                                 General Comments: hx of expressive aphasia and requires increased time to express self; follow commands with poor problem solving.  anticipate  baseline        General Comments General comments (skin integrity, edema, etc.): VSS on RA    Exercises     Assessment/Plan    PT Assessment Patient needs continued PT services  PT Problem List Decreased strength;Decreased range of motion;Decreased activity tolerance;Decreased mobility       PT Treatment Interventions Functional mobility training;Therapeutic activities;Therapeutic exercise;Patient/family education    PT Goals (Current goals can be found in the Care Plan section)  Acute Rehab PT Goals Patient Stated Goal: to roll independently PT Goal Formulation: With patient Time For Goal Achievement: 06/11/21 Potential to Achieve Goals: Fair    Frequency Min 2X/week   Barriers to discharge        Co-evaluation PT/OT/SLP Co-Evaluation/Treatment: Yes Reason for Co-Treatment: Complexity of the patient's impairments (multi-system involvement)   OT goals addressed during session: ADL's and self-care       AM-PAC PT "6 Clicks" Mobility  Outcome Measure Help needed turning from your back to your side while in a flat bed without using bedrails?: A Lot Help needed moving from lying on your back to sitting on the side of a flat bed without using bedrails?: Total Help needed moving to and from a bed to a chair (including a wheelchair)?: Total Help needed standing up from a chair using your arms (e.g., wheelchair or bedside chair)?: Total Help needed to walk in hospital room?: Total Help needed climbing 3-5 steps with a railing? : Total 6 Click Score: 7    End of Session   Activity Tolerance: Other (comment) (pt limiting, reports wanting to sleep) Patient left: in bed;with call bell/phone within reach;with bed alarm set Nurse Communication: Mobility status;Need for lift equipment PT Visit Diagnosis: Muscle weakness (generalized) (M62.81);Other abnormalities of gait and mobility (R26.89)    Time: 1610-9604 PT Time Calculation (min) (ACUTE ONLY): 13 min   Charges:    PT Evaluation $PT Eval Low Complexity: 1 Low          Zenaida Niece, PT, DPT Acute Rehabilitation Pager: [phone] Office [phone]   Zenaida Niece 05/28/2021, 12:34 PM

## 2021-05-28 NOTE — Progress Notes (Signed)
PROGRESS NOTE    Connie Ruiz  MWN:[id number] DOB: [birth year] DOA: 05/26/2021 PCP: [name], [name], MD   Brief Narrative:  60 year old with history of CVA with right-sided hemiparesis, DVT on Coumadin, kidney transplant, lupus, PE, expressive aphasia, DM2 presents from [facility] for evaluation of rectal bleeding.  He was noted to have supratherapeutic INR.  Also found to have acute kidney injury.  CT of the abdomen pelvis were negative.  Patient was seen by GI team who recommended conservative management and outpatient follow-up.  Patient and family refused any form of blood transfusion and consent was signed.   Assessment & Plan:   Principal Problem:   AKI (acute kidney injury) ([place]) Active Problems:   Essential hypertension   LUPUS   KIDNEY TRANSPLANTATION, HX OF   Expressive aphasia   Supratherapeutic INR   Vascular dementia ([place])   Hemiparesis as late effect of cerebrovascular accident (CVA) ([place])   Type 2 diabetes mellitus ([facility])   Rectal bleeding   Anemia of chronic disease   Prolonged QT interval  Acute kidney injury - Baseline creatinine 1.0.  Admission creatinine 2.3.  Creatinine slowly improving.  Today 1.6. Cont Gently hydration.  Rectal bleeding Supratherapeutic INR - Hemoglobin appears to be stable.  Seen by GI team, no further inpatient work-up recommended.  Suspect hemorrhoidal bleed.  Received vitamin K for supratherapeutic INR for admission.  We will recheck INR today. Recommended Anusol suppository at bedtime for 1 week followed by every other day for 2 more weeks.  Daily cholestyramine added.  UTI -On empiric IV Rocephin  Anemia of chronic disease. -Stable.  Baseline around 9.0  History of DVT -On Coumadin, pharmacy to manage.   History of CVA with residual right-sided weakness and expressive aphasia -coumadin on hold.   Lupus History of kidney transplant -[facility]: Tacrolimus 2.5mg  BID, Prednisone 5mg  po daily.  Patient isnt on Cellcept.   Diabetes mellitus type 2 -A1c 6.0.  Accu-Cheks and sliding scale  Essential hypertension -coreg, cardizem, IV prn hydralazine and metoprolol   Vascular dementia -Supportive care.  On Zoloft  Hypothyroidism - Synthroid     DVT prophylaxis: SCDs Code Status: Full  Family Communication:  Daughter updated.   Status is: Inpatient  Remains inpatient appropriate because: Ongoing monitoring of INR, getting IVF for AKI. Worried her PO intake is still not adequate. Advancing diet today. PT/OT eval. Hopefully dc in 24-48 hrs  Subjective: Feeling ok right now. Denies any bleeding at this time. No acute events overnight.   Examination: Constitutional: NAD Respiratory: Clear to auscultation bilaterally Cardiovascular: Normal sinus rhythm, no rubs Abdomen: Nontender nondistended good bowel sounds Musculoskeletal: No edema noted Skin: No rashes seen Neurologic: CN 2-12 grossly intact.  And nonfocal Psychiatric: Normal judgment and insight. Alert and oriented x 3. Normal mood.   Objective: Vitals:   05/27/21 1635 05/27/21 2115 05/28/21 0445 05/28/21 0500  BP: (!) 82/50 (!) 96/55 (!) 100/57   Pulse: 82 89 91   Resp: 18 18 18    Temp: 98.6 F (37 C) 98.8 F (37.1 C) 98.9 F (37.2 C)   TempSrc:  Oral Oral   SpO2: 97% 100% 94%   Height:    5\' 4"  (1.626 m)    Intake/Output Summary (Last 24 hours) at 05/28/2021 0756 Last data filed at 05/28/2021 0551 Gross per 24 hour  Intake 1210.67 ml  Output 200 ml  Net 1010.67 ml   There were no vitals filed for this visit.   Data Reviewed:  CBC: Recent Labs  Lab 05/26/21 1813 05/26/21 1840 05/27/21 0620 05/28/21 0310  WBC 9.5  --  8.9 8.5  NEUTROABS 4.9  --   --   --   HGB 10.8* 11.2* 9.9* 9.9*  HCT 35.0* 33.0* 32.9* 31.6*  MCV 88.2  --  88.9 87.3  PLT 270  --  244 741   Basic Metabolic Panel: Recent Labs  Lab 05/26/21 1813 05/26/21 1840 05/27/21 0620 05/28/21 0310  NA 140 142 140 140  K  4.5 4.3 4.4 4.3  CL 107 106 109 111  CO2 27  --  25 23  GLUCOSE 92 90 88 93  BUN 39* 40* 37* 32*  CREATININE 2.14* 2.30* 1.89* 1.60*  CALCIUM 9.3  --  8.6* 8.8*  MG  --   --   --  1.8   GFR: CrCl cannot be calculated (Unknown ideal weight.). Liver Function Tests: Recent Labs  Lab 05/26/21 1813  AST 12*  ALT 9  ALKPHOS 59  BILITOT 0.5  PROT 5.1*  ALBUMIN 2.2*   Recent Labs  Lab 05/26/21 1813  LIPASE 21   No results for input(s): AMMONIA in the last 168 hours. Coagulation Profile: Recent Labs  Lab 05/26/21 1813 05/27/21 0620  INR 5.0* 4.5*   Cardiac Enzymes: No results for input(s): CKTOTAL, CKMB, CKMBINDEX, TROPONINI in the last 168 hours. BNP (last 3 results) No results for input(s): PROBNP in the last 8760 hours. HbA1C: Recent Labs    05/27/21 0620  HGBA1C 6.0*   CBG: Recent Labs  Lab 05/27/21 1224 05/27/21 1633 05/27/21 2115 05/28/21 0648  GLUCAP 100* 100* 117* 73   Lipid Profile: No results for input(s): CHOL, HDL, LDLCALC, TRIG, CHOLHDL, LDLDIRECT in the last 72 hours. Thyroid Function Tests: No results for input(s): TSH, T4TOTAL, FREET4, T3FREE, THYROIDAB in the last 72 hours. Anemia Panel: No results for input(s): VITAMINB12, FOLATE, FERRITIN, TIBC, IRON, RETICCTPCT in the last 72 hours. Sepsis Labs: Recent Labs  Lab 05/26/21 1813  LATICACIDVEN 1.2    Recent Results (from the past 240 hour(s))  Resp Panel by RT-PCR (Flu A&B, Covid) Nasopharyngeal Swab     Status: None   Collection Time: 05/26/21  2:35 PM   Specimen: Nasopharyngeal Swab; Nasopharyngeal(NP) swabs in vial transport medium  Result Value Ref Range Status   SARS Coronavirus 2 by RT PCR NEGATIVE NEGATIVE Final    Comment: (NOTE) SARS-CoV-2 target nucleic acids are NOT DETECTED.  The SARS-CoV-2 RNA is generally detectable in upper respiratory specimens during the acute phase of infection. The lowest concentration of SARS-CoV-2 viral copies this assay can detect is 138  copies/mL. A negative result does not preclude SARS-Cov-2 infection and should not be used as the sole basis for treatment or other patient management decisions. A negative result may occur with  improper specimen collection/handling, submission of specimen other than nasopharyngeal swab, presence of viral mutation(s) within the areas targeted by this assay, and inadequate number of viral copies(<138 copies/mL). A negative result must be combined with clinical observations, patient history, and epidemiological information. The expected result is Negative.  Fact Sheet for Patients:  EntrepreneurPulse.com.au  Fact Sheet for Healthcare Providers:  IncredibleEmployment.be  This test is no t yet approved or cleared by the [place] FDA and  has been authorized for detection and/or diagnosis of SARS-CoV-2 by FDA under an Emergency Use Authorization (EUA). This EUA will remain  in effect (meaning this test can be used) for the duration of the COVID-19 declaration under Section 564(b)(1)  of the Act, 21 U.S.C.section 360bbb-3(b)(1), unless the authorization is terminated  or revoked sooner.       Influenza A by PCR NEGATIVE NEGATIVE Final   Influenza B by PCR NEGATIVE NEGATIVE Final    Comment: (NOTE) The Xpert Xpress SARS-CoV-2/FLU/RSV plus assay is intended as an aid in the diagnosis of influenza from Nasopharyngeal swab specimens and should not be used as a sole basis for treatment. Nasal washings and aspirates are unacceptable for Xpert Xpress SARS-CoV-2/FLU/RSV testing.  Fact Sheet for Patients: EntrepreneurPulse.com.au  Fact Sheet for Healthcare Providers: IncredibleEmployment.be  This test is not yet approved or cleared by the [place] FDA and has been authorized for detection and/or diagnosis of SARS-CoV-2 by FDA under an Emergency Use Authorization (EUA). This EUA will remain in effect (meaning  this test can be used) for the duration of the COVID-19 declaration under Section 564(b)(1) of the Act, 21 U.S.C. section 360bbb-3(b)(1), unless the authorization is terminated or revoked.  Performed at [facility], [place] [address] [place], [place] [address]          Radiology Studies: No results found.      Scheduled Meds:  calcitRIOL  0.25 mcg Oral Daily   carvedilol  6.25 mg Oral BID WC   [START ON 05/29/2021] cholestyramine  4 g Oral Q24H   diltiazem  360 mg Oral Daily   donepezil  10 mg Oral QHS   famotidine  20 mg Oral Daily   hydrocortisone  25 mg Rectal QHS   insulin aspart  0-9 Units Subcutaneous TID WC & HS   levothyroxine  150 mcg Oral Q0600   mirtazapine  30 mg Oral QHS   pantoprazole  40 mg Oral Daily   predniSONE  5 mg Oral Q breakfast   sertraline  50 mg Oral Daily   tacrolimus  2.5 mg Oral BID   Continuous Infusions:  cefTRIAXone (ROCEPHIN)  IV Stopped (05/27/21 1813)   lactated ringers 75 mL/hr at 05/28/21 0000     LOS: 2 days   Time spent= 35 mins    Ellyse Rotolo Arsenio Loader, MD [facility]  If 7PM-7AM, please contact night-coverage  05/28/2021, 7:56 AM

## 2021-05-28 NOTE — TOC Initial Note (Signed)
Transition of Care [facility]) - Initial/Assessment Note    Patient Details  Name: Connie Ruiz MRN: [id number] Date of Birth: 1961-01-31  [facility] CM/SW Contact:    Milinda Antis, [place] Phone Number: 05/28/2021, 2:19 PM  Clinical Narrative:                 CSW spoke with the patient' daughter after being informed that she wanted to speak with CSW.  The patient's daughter reports that the patient is LT care at [facility] and is wanting to look at other places.  The daughter mentioned [facility] and [facility].  CSW checked with both facilities who do not have any bed availability.  [facility] does have an application and waiting list.    CSW contacted the patient's daughter and gave the above information along with the contact information for the person who wil assist with getting the patient on the waiting list for [facility].  The patient's daughter reports that since there are not any openings, she would like for the patient to return to [facility] at discharge.          Patient Goals and CMS Choice        Expected Discharge Plan and Services                                                Prior Living Arrangements/Services                       Activities of Daily Living      Permission Sought/Granted                  Emotional Assessment              Admission diagnosis:  Elevated INR [R79.1] AKI (acute kidney injury) ([place]) [N17.9] Gastrointestinal hemorrhage, unspecified gastrointestinal hemorrhage type [K92.2] Patient Active Problem List   Diagnosis Date Noted   AKI (acute kidney injury) ([place]) 05/26/2021   Rectal bleeding 05/26/2021   Anemia of chronic disease 05/26/2021   Prolonged QT interval 05/26/2021   Enteritis 01/27/2021   Hypothyroidism, 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   Hypothyroidism 01/14/2018   HLD (hyperlipidemia) 01/14/2018   CVA (cerebral vascular accident) ([place]) 01/12/2018   Chest pain 01/11/2018    Acute respiratory failure with hypoxia ([place]) 07/20/2017   Abdominal bloating 07/20/2017   Sinus tachycardia 07/20/2017   SLE (systemic lupus erythematosus related syndrome) ([place]) 07/20/2017   CAP (community acquired pneumonia) 07/16/2017   Screening examination for infectious disease 01/28/2016   Type 2 diabetes mellitus ([place]) 08/03/2015   Urinary tract infection 07/20/2015   Vascular dementia ([place]) 05/08/2015   Hemiparesis as late effect of cerebrovascular accident (CVA) ([place]) 05/08/2015   Aphasia as late effect of stroke 05/08/2015   C. difficile colitis 02/25/2015   Hypokalemia 02/25/2015   CKD (chronic kidney disease) 02/25/2015   Colitis 02/14/2015   Sepsis ([place]) 02/14/2015   Nausea vomiting and diarrhea 02/14/2015   Abdominal pain 02/14/2015   UTI (lower urinary tract infection) 02/14/2015   Abdominal pain, chronic, generalized 01/07/2015   Candida esophagitis ([place]) 11/12/2014   Abnormal CT of the abdomen    Steroid-induced hyperglycemia 11/09/2014   Hypomagnesemia 11/06/2014   Acute abdominal pain    Supratherapeutic INR 11/02/2014   Jejunitis 11/02/2014   Myalgia and myositis 04/05/2014   Wellness examination  04/05/2014   Menopausal state 04/05/2014   Palpitations 08/31/2013   Hyperlipidemia 08/31/2013   Disorder of heart rhythm 08/13/2013   TIA (transient ischemic attack) 06/20/2013   Gout flare: R elbow and R shoulder 06/20/2013   Acute encephalopathy 06/14/2013   Rhabdomyolysis 06/14/2013   History of stroke with residual effects    Right sided weakness    Breast mass, left 04/30/2013   Contusion of knee, left 10/18/2012   Depression 10/05/2012   Encounter for long-term (current) use of other medications 10/04/2012   Multiple thyroid nodules 06/08/2012   Risk for falls 05/05/2012   Physical deconditioning 05/05/2012   Fever 04/27/2012   DVT (deep venous thrombosis) ([place]) 06/17/2011   Expressive aphasia 06/17/2011   Dysphasia 06/11/2011   Incontinence of  urine 06/11/2011   Hand pain, left 06/11/2011   Immunosuppressive management encounter following kidney transplant May 21, 2011   Hypertension goal BP (blood pressure) < 130/80 05-21-11   Deceased-donor kidney transplant recipient 2011-05-21   Arm pain, left 05/07/2011   Dysfunctional voiding of urine 04/28/2011   Urge incontinence 04/28/2011   Urinary urgency 04/28/2011   Routine general medical examination at a health care facility 04/13/2011   Anticoagulated 01/08/2011   Pulmonary embolism ([place]) 07/16/2010   Cerebral artery occlusion with cerebral infarction ([place]) 04/17/2010   CEREBROVASCULAR ACCIDENT, ACUTE 04/15/2010   THYROID NODULE, LEFT 04/10/2009   Cough 03/26/2009   ACUTE BRONCHITIS 08/22/2007   KIDNEY TRANSPLANTATION, HX OF 08/22/2007   Gout 08/21/2007   Essential hypertension 08/21/2007   Chronic diastolic congestive heart failure ([place]) 08/21/2007   GERD 08/21/2007   Disorder resulting from impaired renal function 08/21/2007   LUPUS 08/21/2007   Osteoporosis 08/21/2007   DVT, HX OF 08/21/2007   Enteritis due to Clostridium difficile 08/21/2007   PCP:  [name], [name], MD Pharmacy:   [facility] [place], [place] [address] [place] [address] Phone: [phone] Fax: [phone]  [place], [place] - [address] [place] [address] Phone: [phone] Fax: [phone]     Social Determinants of Health ([place]) Interventions    Readmission Risk Interventions No flowsheet data found.

## 2021-05-28 NOTE — Progress Notes (Signed)
ANTICOAGULATION CONSULT NOTE - Initial Consult  Pharmacy Consult for warfarin Indication: DVT  Allergies  Allergen Reactions   Oxycodone-Acetaminophen Shortness Of Breath and Nausea Only   Propoxyphene Nausea Only and Shortness Of Breath    States takes tylenol at home   Propoxyphene N-Acetaminophen Shortness Of Breath and Nausea Only   Sulfonamide Derivatives Shortness Of Breath and Nausea Only   Codeine Nausea Only   Hydrocodone-Acetaminophen    Hydromorphone    Other Other (See Comments)    No blood, Jehovaeh Witness    Oxycodone    Sulfamethoxazole    Tape     Redness**PAPER TAPE OK**   Gabapentin Anxiety    twitching   Latex Rash   Metoprolol Rash   Morphine Rash    Patient is given dose of Benadryl when taking Morphine    Morphine And Related Rash    IV site on arm is red, patient reports this is improving.  NO shortness of breath reported.   Rosiglitazone Rash    Patient Measurements: Height: '5\' 4"'  (162.6 cm) IBW/kg (Calculated) : 54.7   Vital Signs: Temp: 99 F (37.2 C) (12/22 0823) Temp Source: Oral (12/22 0823) BP: 137/65 (12/22 0823) Pulse Rate: 92 (12/22 0823)  Labs: Recent Labs    05/26/21 1813 05/26/21 1840 05/27/21 0620 05/28/21 0310 05/28/21 0944  HGB 10.8* 11.2* 9.9* 9.9*  --   HCT 35.0* 33.0* 32.9* 31.6*  --   PLT 270  --  244 208  --   LABPROT 46.5*  --  42.5*  --  24.6*  INR 5.0*  --  4.5*  --  2.2*  CREATININE 2.14* 2.30* 1.89* 1.60*  --     CrCl cannot be calculated (Unknown ideal weight.).   Medical History: Past Medical History:  Diagnosis Date   Anxiety    Candida esophagitis ([place]) 11/12/2014   CEREBROVASCULAR ACCIDENT, ACUTE 04/15/2010   CLOSTRIDIUM DIFFICILE COLITIS, HX OF 08/21/2007   CONGESTIVE HEART FAILURE 08/21/2007   Current use of long term anticoagulation    Dr. Andree Elk, [facility]   CVA 04/17/2010   Depression    Dr. Andree Elk, [place], TYPE II 08/21/2007   DVT, HX OF 08/21/2007   GERD 08/21/2007    GOUT 08/21/2007   History of stroke with residual effects    HYPERLIPIDEMIA 08/21/2007   HYPERTENSION 08/21/2007   Dr. Andree Elk, [place], HX OF 08/22/2007   s/p renal transplant-Dr. Bonney Leitz   LUPUS 08/21/2007   OSTEOPOROSIS 08/21/2007   Rheumatol at [facility]   Pulmonary embolism ([place]) 07/16/2010   Renal failure    RENAL INSUFFICIENCY 08/21/2007   Right sided weakness    Steroid-induced hyperglycemia 11/09/2014   Tachycardia    THYROID NODULE, LEFT 04/10/2009    Medications:  Medications Prior to Admission  Medication Sig Dispense Refill Last Dose   acetaminophen (TYLENOL) 500 MG tablet Take 1,000 mg by mouth every 6 (six) hours as needed for moderate pain.   unk   alendronate (FOSAMAX) 70 MG tablet Take 70 mg by mouth once a week. Take with a full glass of water on an empty stomach on Saturdays.   unk   calcitRIOL (ROCALTROL) 0.25 MCG capsule TAKE 1 CAPSULE(0.25 MCG) BY MOUTH DAILY. NO REFILLS WITHOUT APPOINTMENT (Patient taking differently: Take 0.25 mcg by mouth daily.) 30 capsule 0 unk   carvedilol (COREG) 6.25 MG tablet Take 1 tablet (6.25 mg total) by mouth 2 (two) times daily with a meal. 60 tablet 0  unk   cholecalciferol (VITAMIN D3) 25 MCG (1000 UNIT) tablet Take 1,000 Units by mouth daily.   unk   dicyclomine (BENTYL) 10 MG capsule Take 1 capsule (10 mg total) by mouth 3 (three) times daily before meals. 30 capsule 0 unk   diltiazem (TIAZAC) 360 MG 24 hr capsule Take 1 capsule (360 mg total) by mouth daily. 90 capsule 1 unk   donepezil (ARICEPT) 10 MG tablet Take 10 mg by mouth at bedtime.    unk   esomeprazole (NEXIUM) 20 MG capsule TAKE 1 CAPSULE(20 MG) BY MOUTH DAILY (Patient taking differently: Take 20 mg by mouth daily at 12 noon.) 30 capsule 11 unk   famotidine (PEPCID) 20 MG tablet Take 20 mg by mouth 2 (two) times daily.   unk   furosemide (LASIX) 20 MG tablet Take 20 mg by mouth daily.   unk   glucose blood (ONE TOUCH ULTRA TEST) test strip Use to  check blood sugar 1 time per day 100 each 2 unk   insulin lispro (HUMALOG) 100 UNIT/ML injection Inject 0-12 Units into the skin 3 (three) times daily before meals. Per Sliding Scale. If blood sugar is: 0-150: 0 units 151-200: 2 units 201-250: 4 units 251-300: 6 units 301-350: 8 units 351-400: 10 units 401-450: 12 units   unk   levothyroxine (SYNTHROID) 150 MCG tablet Take 1 tablet (150 mcg total) by mouth daily at 6 (six) AM. 30 tablet 0 unk   loperamide (IMODIUM) 2 MG capsule Take 2 mg by mouth every 6 (six) hours as needed for diarrhea or loose stools.   unk   magnesium oxide (MAG-OX) 400 MG tablet Take 800 mg by mouth daily.    unk   mirtazapine (REMERON) 30 MG tablet Take 1 tablet (30 mg total) by mouth at bedtime. 30 tablet 1 unk   PSYLLIUM PO Take 1 packet by mouth daily. 3g/5.4g: 2tsp -mix 1sp in 8oz of water qd   unk   sertraline (ZOLOFT) 50 MG tablet Take 50 mg by mouth daily.   unk   Skin Protectants, Misc. (EUCERIN) cream Apply 1 application topically daily. To feet   unk   sucralfate (CARAFATE) 1 g tablet Take 1 tablet (1 g total) by mouth 4 (four) times daily for 30 days.   unk   tacrolimus (PROGRAF) 0.5 MG capsule Take 0.5 mg by mouth See admin instructions. Takes 0.88m cap with 2 126mTacrolimus to equal a total 2.48m60mvery 12 hours   unk   tacrolimus (PROGRAF) 1 MG capsule Take 2 mg by mouth See admin instructions. Takes 2 1mg36mpsules with Tacrolimus 0.48mg 8msules to = 2.48mg e57my 12 hours   unk   Blood Glucose Monitoring Suppl (ACCU-CHEK AVIVA PLUS) w/Device KIT 1 Device by Does not apply route daily. 1 kit 1    ondansetron (ZOFRAN ODT) 4 MG disintegrating tablet 4mg OD9m4 hours prn nausea/vomiting (Patient taking differently: Take 4 mg by mouth every 4 (four) hours as needed for nausea or vomiting.) 15 tablet 0    ONETOUCH DELICA LANCETS 33G [id number] to check blood sugar 1 time per day 100 each 2    warfarin (COUMADIN) 1 MG tablet Take 0.5 mg by mouth See admin  instructions. Takes 1/2 tab with 3mg tab14m equal a total of 3.48mg ever2mther day. (Patient not taking: Reported on 05/26/2021)   Not Taking   warfarin (COUMADIN) 3 MG tablet Take 3 mg by mouth See admin instructions. Takes 1 3mg tab w66m  1/2 of a 50m tab to equal a total of 3.562mevery other day (Patient not taking: Reported on 05/26/2021)   Not Taking   warfarin (COUMADIN) 4 MG tablet Take by mouth. (Patient not taking: Reported on 05/26/2021)   Not Taking    Assessment: [age]o female with h/o DVT on warfarin PTA. Admitted  for supratherapeutic INR of  5. Patient also noted with rectal bleeding which now appears to be hemorrhoidal.  Patient appears to be on 3.64m36mlternating with 4mg764mery other day. Patient s/p Vit K 2.64mg 84mx 1 and now INR is 2.2. Will restart warfarin at a lower dose.   Goal of Therapy:  INR 2-3 Monitor platelets by anticoagulation protocol: Yes   Plan:  Warfarin 3mg P19m 1 today Daily INR Monitor for further bleeding.   Theodor Mustin A. PierceLevada DymD, BCPS, FNKF Clinical Pharmacist [place] Please utilize Amion for appropriate phone number to reach the unit pharmacist ([facility]22/2022,12:10 PM

## 2021-05-29 LAB — BASIC METABOLIC PANEL
Anion gap: 3 — ABNORMAL LOW (ref 5–15)
BUN: 32 mg/dL — ABNORMAL HIGH (ref 6–20)
CO2: 25 mmol/L (ref 22–32)
Calcium: 8.7 mg/dL — ABNORMAL LOW (ref 8.9–10.3)
Chloride: 113 mmol/L — ABNORMAL HIGH (ref 98–111)
Creatinine, Ser: 1.46 mg/dL — ABNORMAL HIGH (ref 0.44–1.00)
GFR, Estimated: 41 mL/min — ABNORMAL LOW (ref >60)
Glucose, Bld: 103 mg/dL — ABNORMAL HIGH (ref 70–99)
Potassium: 4.2 mmol/L (ref 3.5–5.1)
Sodium: 141 mmol/L (ref 135–145)

## 2021-05-29 LAB — MAGNESIUM: Magnesium: 1.8 mg/dL (ref 1.7–2.4)

## 2021-05-29 LAB — CBC
HCT: 31.1 % — ABNORMAL LOW (ref 36.0–46.0)
Hemoglobin: 9.8 g/dL — ABNORMAL LOW (ref 12.0–15.0)
MCH: 27.5 pg (ref 26.0–34.0)
MCHC: 31.5 g/dL (ref 30.0–36.0)
MCV: 87.1 fL (ref 80.0–100.0)
Platelets: 221 10*3/uL (ref 150–400)
RBC: 3.57 MIL/uL — ABNORMAL LOW (ref 3.87–5.11)
RDW: 19.1 % — ABNORMAL HIGH (ref 11.5–15.5)
WBC: 8.2 10*3/uL (ref 4.0–10.5)
nRBC: 0 % (ref 0.0–0.2)

## 2021-05-29 LAB — RESP PANEL BY RT-PCR (FLU A&B, COVID) ARPGX2
Influenza A by PCR: NEGATIVE
Influenza B by PCR: NEGATIVE
SARS Coronavirus 2 by RT PCR: NEGATIVE

## 2021-05-29 LAB — PROTIME-INR
INR: 2.3 — ABNORMAL HIGH (ref 0.8–1.2)
Prothrombin Time: 25.1 seconds — ABNORMAL HIGH (ref 11.4–15.2)

## 2021-05-29 LAB — GLUCOSE, CAPILLARY
Glucose-Capillary: 100 mg/dL — ABNORMAL HIGH (ref 70–99)
Glucose-Capillary: 93 mg/dL (ref 70–99)
Glucose-Capillary: 151 mg/dL — ABNORMAL HIGH (ref 70–99)
Glucose-Capillary: 131 mg/dL — ABNORMAL HIGH (ref 70–99)

## 2021-05-29 MED ORDER — CEPHALEXIN 500 MG PO CAPS
500.0000 mg | ORAL_CAPSULE | Freq: Four times a day (QID) | ORAL | 0 refills | Status: AC
Start: 2021-05-29 — End: 2021-06-01

## 2021-05-29 MED ORDER — NYSTATIN 100000 UNIT/GM EX POWD: Freq: Two times a day (BID) | CUTANEOUS | 0 refills | Status: DC

## 2021-05-29 MED ORDER — WARFARIN SODIUM 3 MG PO TABS
3.0000 mg | ORAL_TABLET | Freq: Once | ORAL | Status: AC
  Administered 2021-05-29: 3 mg via ORAL
  Filled 2021-05-29: qty 1

## 2021-05-29 MED ORDER — HYDROCORTISONE ACETATE 25 MG RE SUPP: 25.0000 mg | Freq: Every day | RECTAL | 0 refills | Status: DC

## 2021-05-29 MED ORDER — PREDNISONE 5 MG PO TABS: 5.0000 mg | ORAL_TABLET | Freq: Every day | ORAL | 0 refills | Status: DC

## 2021-05-29 MED ORDER — CHOLESTYRAMINE 4 G PO PACK: 4.0000 g | PACK | ORAL | 0 refills | Status: DC

## 2021-05-29 NOTE — TOC Progression Note (Addendum)
Transition of Care [facility]) - Progression Note    Patient Details  Name: GEANETTE BUONOCORE MRN: [id number] Date of Birth: 12-18-1960  [facility] CM/SW [place], LCSW Phone Number: 05/29/2021, 9:45 AM  Clinical Narrative:    CSW made Miquel Dunn aware that patient will discharge today. They requested CSW attempt to get rehab authorization, though that will not stop patient from discharging today. CSW submitted clinicals to insurance, Ref# [id number].  Left voicemail for patient's daughter.  1pm-Patient's daughter returned call and reported agreement with discharge plan. She requested [facility] for transport.   Expected Discharge Plan: [place] Barriers to Discharge: No Barriers Identified  Expected Discharge Plan and Services Expected Discharge Plan: [place] In-house Referral: Clinical Social Work   Post Acute Care Choice: [place] Living arrangements for the past 2 months: [place] Expected Discharge Date: 05/29/21                                     Social Determinants of Health (SDOH) Interventions    Readmission Risk Interventions No flowsheet data found.

## 2021-05-29 NOTE — Progress Notes (Signed)
Nursing report called to [place]

## 2021-05-29 NOTE — Discharge Summary (Signed)
Physician Discharge Summary  Connie Ruiz RWE:[id number] DOB: 02-12-1961 DOA: 05/26/2021  PCP: [name], [name], MD  Admit date: 05/26/2021 Discharge date: 05/29/2021  Admitted From: Long-term care Disposition: Long-term care  Recommendations for Outpatient Follow-up:  Follow up with PCP in 1-2 weeks Please obtain BMP/CBC in 3-5 days to ensure renal function is improving Take Coumadin as previously prescribed.  Goal INR 2-3.  Adjust the dose as necessary Keflex orally for next 3 days for UTI Colestyramine daily prescribed Anusol suppository nightly for 1 week followed by every other night for 2 weeks Follow-up with outpatient primary gastroenterology in next 2-4 weeks Nystatin powder prescribed for Candida under her bilateral breasts Prednisone 5 mg orally daily.  Per outpatient review of records with the help of pharmacist, appears patient should be on this along with tacrolimus.  This can be readdressed with her PCP and transplant team.   Discharge Condition: Stable CODE STATUS: Full code Diet recommendation: Diabetic  Brief/Interim Summary: 60 year old with history of CVA with right-sided hemiparesis, DVT on Coumadin, kidney transplant, lupus, PE, expressive aphasia, DM2 presents from [facility] rehab center for evaluation of rectal bleeding.  He was noted to have supratherapeutic INR.  Also found to have acute kidney injury.  CT of the abdomen pelvis were negative.  Patient was seen by GI team who recommended conservative management and outpatient follow-up.  Patient and family refused any form of blood transfusion and consent was signed.  During hospitalization she also received IV fluids which improved her renal function.  INR was therapeutic range, her bleeding subsided and hemoglobin remained stable.  Rest of the recommendations as stated above. Medically stable for discharge  Called her daugther on the day of dc, but no answer.      Assessment & Plan:   Principal Problem:   AKI  (acute kidney injury) ([place]) Active Problems:   Essential hypertension   LUPUS   KIDNEY TRANSPLANTATION, HX OF   Expressive aphasia   Supratherapeutic INR   Vascular dementia (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  Admission creatinine 2.3.  Creatinine slowly improving.  Cr 1.6 > 1.46.  Advise repeat lab in next 3-5 days   Rectal bleeding Supratherapeutic INR - Seen by GI team, suspect hemorrhoidal bleed.  Initially received vitamin K for supratherapeutic INR now its in therapeutic range.  Patient has been prescribed Anusol suppository nightly for 1 week followed by every other night for additional 2 weeks.  Daily cholestyramine has also been added.  Needs to follow-up with outpatient gastroenterology   UTI -On empiric IV Rocephin..  Cultures grew multiple species.  We will transition to oral Keflex to complete 5-day course   Anemia of chronic disease. -Stable.  Baseline around 9.0   History of DVT -We will discharge on Coumadin, INR is therapeutic today.   History of CVA with residual right-sided weakness and expressive aphasia -coumadin on hold.    Lupus History of kidney transplant -Confirmed [facility] at [facility]: Tacrolimus 2.68m BID, Prednisone 522mpo daily.  This is also been documented on previous hospital discharge summary from September 2022.  Patient isnt on Cellcept.  These medication should be readdressed with outpatient primary transplant team and PCP.   Diabetes mellitus type 2 -A1c 6.0.  Accu-Cheks and sliding scale   Essential hypertension -  coreg, cardizem, IV prn hydralazine and metoprolol    Vascular dementia -Supportive care.  On Zoloft   Hypothyroidism - Synthroid    Body mass index is 35.87 kg/m.         Discharge Diagnoses:  Principal Problem:    AKI (acute kidney injury) ([place]) Active Problems:   Essential hypertension   LUPUS   KIDNEY TRANSPLANTATION, HX OF   Expressive aphasia   Supratherapeutic INR   Vascular dementia (HCC)   Hemiparesis as late effect of cerebrovascular accident (CVA) ([place])   Type 2 diabetes mellitus (HCC)   Rectal bleeding   Anemia of chronic disease   Prolonged QT interval      Consultations: [facility]  Subjective: Feeling ok, no complaints. No further bleeding.   Discharge Exam: Vitals:   05/28/21 2150 05/29/21 0655  BP: 122/73 120/61  Pulse: 84 89  Resp: 18 15  Temp: 98.1 F (36.7 C) 98.5 F (36.9 C)  SpO2: 99% 99%   Vitals:   05/28/21 0823 05/28/21 1658 05/28/21 2150 05/29/21 0655  BP: 137/65 124/72 122/73 120/61  Pulse: 92 85 84 89  Resp: _0 Temp: 99 F (37.2 C) 98.3 F (36.8 C) 98.1 F (36.7 C) 98.5 F (36.9 C)  TempSrc: Oral  Oral Oral  SpO2: 95% 99% 99% 99%  Height:        General: Pt is alert, awake, not in acute distress Cardiovascular: RRR, S1/S2 +, no rubs, no gallops Respiratory: CTA bilaterally, no wheezing, no rhonchi Abdominal: Soft, NT, ND, bowel sounds + Extremities: no edema, no cyanosis  Discharge Instructions   Allergies as of 05/29/2021       Reactions   Oxycodone-acetaminophen Shortness Of Breath, Nausea Only   Propoxyphene Nausea Only, Shortness Of Breath   States takes tylenol at home   Propoxyphene N-acetaminophen Shortness Of Breath, Nausea Only   Sulfonamide Derivatives Shortness Of Breath, Nausea Only   Codeine Nausea Only   Hydrocodone-acetaminophen    Hydromorphone    Other Other (See Comments)   No blood, Jehovaeh Witness    Oxycodone    Sulfamethoxazole    Tape    Redness**PAPER TAPE OK**   Gabapentin Anxiety   twitching   Latex Rash   Metoprolol Rash   Morphine Rash   Patient is given dose of Benadryl when taking Morphine    Morphine And Related Rash   IV site on arm is red, patient reports this is improving.   NO shortness of breath reported.   Rosiglitazone Rash        Medication List     TAKE these medications    Accu-Chek Aviva Plus w/Device Kit 1 Device by Does not apply route daily.   acetaminophen 500 MG tablet Commonly known as: TYLENOL Take 1,000 mg by mouth every 6 (six) hours as needed for moderate pain.   alendronate 70 MG tablet Commonly known as: FOSAMAX Take 70 mg by mouth once a week. Take with a full glass of water on an empty stomach on Saturdays.   calcitRIOL 0.25 MCG capsule Commonly known as: ROCALTROL TAKE 1 CAPSULE(0.25 MCG) BY MOUTH DAILY. NO REFILLS WITHOUT APPOINTMENT What changed:  how much to take how to take this when to take this additional instructions   carvedilol 6.25 MG tablet Commonly known as: COREG Take 1 tablet (6.25 mg total) by mouth 2 (two) times daily with a meal.   cephALEXin 500 MG capsule Commonly known as: KEFLEX Take 1 capsule (500 mg total)  by mouth 4 (four) times daily for 3 days.   cholecalciferol 25 MCG (1000 UNIT) tablet Commonly known as: VITAMIN D3 Take 1,000 Units by mouth daily.   cholestyramine 4 g packet Commonly known as: QUESTRAN Take 1 packet (4 g total) by mouth daily.   dicyclomine 10 MG capsule Commonly known as: BENTYL Take 1 capsule (10 mg total) by mouth 3 (three) times daily before meals.   diltiazem 360 MG 24 hr capsule Commonly known as: TIAZAC Take 1 capsule (360 mg total) by mouth daily.   donepezil 10 MG tablet Commonly known as: ARICEPT Take 10 mg by mouth at bedtime.   esomeprazole 20 MG capsule Commonly known as: NEXIUM TAKE 1 CAPSULE(20 MG) BY MOUTH DAILY What changed: See the new instructions.   eucerin cream Apply 1 application topically daily. To feet   famotidine 20 MG tablet Commonly known as: PEPCID Take 20 mg by mouth 2 (two) times daily.   furosemide 20 MG tablet Commonly known as: LASIX Take 20 mg by mouth daily.   glucose blood test strip Commonly known as: ONE  TOUCH ULTRA TEST Use to check blood sugar 1 time per day   HumaLOG 100 UNIT/ML injection Generic drug: insulin lispro Inject 0-12 Units into the skin 3 (three) times daily before meals. Per Sliding Scale. If blood sugar is: 0-150: 0 units 151-200: 2 units 201-250: 4 units 251-300: 6 units 301-350: 8 units 351-400: 10 units 401-450: 12 units   hydrocortisone 25 MG suppository Commonly known as: ANUSOL-HC Place 1 suppository (25 mg total) rectally at bedtime.   levothyroxine 150 MCG tablet Commonly known as: SYNTHROID Take 1 tablet (150 mcg total) by mouth daily at 6 (six) AM.   loperamide 2 MG capsule Commonly known as: IMODIUM Take 2 mg by mouth every 6 (six) hours as needed for diarrhea or loose stools.   magnesium oxide 400 MG tablet Commonly known as: MAG-OX Take 800 mg by mouth daily.   mirtazapine 30 MG tablet Commonly known as: REMERON Take 1 tablet (30 mg total) by mouth at bedtime.   nystatin powder Commonly known as: MYCOSTATIN/NYSTOP Apply topically 2 (two) times daily.   ondansetron 4 MG disintegrating tablet Commonly known as: Zofran ODT 2m ODT q4 hours prn nausea/vomiting What changed:  how much to take how to take this when to take this reasons to take this additional instructions   OneTouch Delica Lancets 331RMisc Use to check blood sugar 1 time per day   predniSONE 5 MG tablet Commonly known as: DELTASONE Take 1 tablet (5 mg total) by mouth daily with breakfast.   PSYLLIUM PO Take 1 packet by mouth daily. 3g/5.4g: 2tsp -mix 1sp in 8oz of water qd   sertraline 50 MG tablet Commonly known as: ZOLOFT Take 50 mg by mouth daily.   sucralfate 1 g tablet Commonly known as: Carafate Take 1 tablet (1 g total) by mouth 4 (four) times daily for 30 days.   tacrolimus 1 MG capsule Commonly known as: PROGRAF Take 2 mg by mouth See admin instructions. Takes 2 160mcapsules with Tacrolimus 0.53m1mapsules to = 2.53mg66mery 12 hours   tacrolimus 0.5  MG capsule Commonly known as: PROGRAF Take 0.5 mg by mouth See admin instructions. Takes 0.53mg 67m with 2 1mg T18molimus to equal a total 2.53mg ev57m 12 hours   warfarin 3 MG tablet Commonly known as: COUMADIN Take 3 mg by mouth See admin instructions. Takes 1 3mg tab23mth 1/2 of a 1mg tab753m  to equal a total of 3.57m every other day   warfarin 1 MG tablet Commonly known as: COUMADIN Take 0.5 mg by mouth See admin instructions. Takes 1/2 tab with 344mtab to equal a total of 3.42m41mvery other day.   warfarin 4 MG tablet Commonly known as: COUMADIN Take by mouth.        Follow-up Information     JorMartiniqueetty G, MD Follow up in 1 week(s).   Specialty: Family Medicine Contact information: [address] [phone]         BerLorretta HarpD .   Specialties: Cardiology, Radiology Contact information: [id number] [id number]             Allergies  Allergen Reactions   Oxycodone-Acetaminophen Shortness Of Breath and Nausea Only   Propoxyphene Nausea Only and Shortness Of Breath    States takes tylenol at home   Propoxyphene N-Acetaminophen Shortness Of Breath and Nausea Only   Sulfonamide Derivatives Shortness Of Breath and Nausea Only   Codeine Nausea Only   Hydrocodone-Acetaminophen    Hydromorphone    Other Other (See Comments)    No blood, Jehovaeh Witness    Oxycodone    Sulfamethoxazole    Tape     Redness**PAPER TAPE OK**   Gabapentin Anxiety    twitching   Latex Rash   Metoprolol Rash   Morphine Rash    Patient is given dose of Benadryl when taking Morphine    Morphine And Related Rash    IV site on arm is red, patient reports this is improving.  NO shortness of breath reported.   Rosiglitazone Rash    You were cared for by a hospitalist during your hospital stay. If you have any questions about your discharge medications or the care you received while you were in the  hospital after you are discharged, you can call the unit and asked to speak with the hospitalist on call if the hospitalist that took care of you is not available. Once you are discharged, your primary care physician will handle any further medical issues. Please note that no refills for any discharge medications will be authorized once you are discharged, as it is imperative that you return to your primary care physician (or establish a relationship with a primary care physician if you do not have one) for your aftercare needs so that they can reassess your need for medications and monitor your lab values.   Procedures/Studies: No results found.   The results of significant diagnostics from this hospitalization (including imaging, microbiology, ancillary and laboratory) are listed below for reference.     Microbiology: Recent Results (from the past 240 hour(s))  Resp Panel by RT-PCR (Flu A&B, Covid) Nasopharyngeal Swab     Status: None   Collection Time: 05/26/21  2:35 PM   Specimen: Nasopharyngeal Swab; Nasopharyngeal(NP) swabs in vial transport medium  Result Value Ref Range Status   SARS Coronavirus 2 by RT PCR NEGATIVE NEGATIVE Final    Comment: (NOTE) SARS-CoV-2 target nucleic acids are NOT DETECTED.  The SARS-CoV-2 RNA is generally detectable in upper respiratory specimens during the acute phase of infection. The lowest concentration of SARS-CoV-2 viral copies this assay can detect is 138 copies/mL. A negative result does not preclude SARS-Cov-2 infection and should not be used as the sole basis for treatment or other patient management decisions. A negative result may occur with  improper specimen  collection/handling, submission of specimen other than nasopharyngeal swab, presence of viral mutation(s) within the areas targeted by this assay, and inadequate number of viral copies(<138 copies/mL). A negative result must be combined with clinical observations, patient history, and  epidemiological information. The expected result is Negative.  Fact Sheet for Patients:  EntrepreneurPulse.com.au  Fact Sheet for Healthcare Providers:  IncredibleEmployment.be  This test is no t yet approved or cleared by the [place] FDA and  has been authorized for detection and/or diagnosis of SARS-CoV-2 by FDA under an Emergency Use Authorization (EUA). This EUA will remain  in effect (meaning this test can be used) for the duration of the COVID-19 declaration under Section 564(b)(1) of the Act, 21 U.S.C.section 360bbb-3(b)(1), unless the authorization is terminated  or revoked sooner.       Influenza A by PCR NEGATIVE NEGATIVE Final   Influenza B by PCR NEGATIVE NEGATIVE Final    Comment: (NOTE) The Xpert Xpress SARS-CoV-2/FLU/RSV plus assay is intended as an aid in the diagnosis of influenza from Nasopharyngeal swab specimens and should not be used as a sole basis for treatment. Nasal washings and aspirates are unacceptable for Xpert Xpress SARS-CoV-2/FLU/RSV testing.  Fact Sheet for Patients: EntrepreneurPulse.com.au  Fact Sheet for Healthcare Providers: IncredibleEmployment.be  This test is not yet approved or cleared by the [place] FDA and has been authorized for detection and/or diagnosis of SARS-CoV-2 by FDA under an Emergency Use Authorization (EUA). This EUA will remain in effect (meaning this test can be used) for the duration of the COVID-19 declaration under Section 564(b)(1) of the Act, 21 U.S.C. section 360bbb-3(b)(1), unless the authorization is terminated or revoked.  Performed at [facility], [place] [address], [place], [place] [address]   Urine Culture     Status: Abnormal   Collection Time: 05/26/21  9:22 PM   Specimen: Urine, Clean Catch  Result Value Ref Range Status   Specimen Description URINE, CLEAN CATCH  Final   Special Requests   Final     NONE Performed at [facility], [place] [address], [place], [place] [address]    Culture MULTIPLE SPECIES PRESENT, SUGGEST RECOLLECTION (A)  Final   Report Status 05/28/2021 FINAL  Final     Labs: BNP (last 3 results) Recent Labs    08/21/20 1420  BNP 44.0   Basic Metabolic Panel: Recent Labs  Lab 05/26/21 1813 05/26/21 1840 05/27/21 0620 05/28/21 0310 05/29/21 0321  NA 140 142 140 140 141  K 4.5 4.3 4.4 4.3 4.2  CL 107 106 109 111 113*  CO2 27  --  _0 GLUCOSE 92 90 88 93 103*  BUN 39* 40* 37* 32* 32*  CREATININE 2.14* 2.30* 1.89* 1.60* 1.46*  CALCIUM 9.3  --  8.6* 8.8* 8.7*  MG  --   --   --  1.8 1.8   Liver Function Tests: Recent Labs  Lab 05/26/21 1813  AST 12*  ALT 9  ALKPHOS 59  BILITOT 0.5  PROT 5.1*  ALBUMIN 2.2*   Recent Labs  Lab 05/26/21 1813  LIPASE 21   No results for input(s): AMMONIA in the last 168 hours. CBC: Recent Labs  Lab 05/26/21 1813 05/26/21 1840 05/27/21 0620 05/28/21 0310 05/29/21 0321  WBC 9.5  --  8.9 8.5 8.2  NEUTROABS 4.9  --   --   --   --   HGB 10.8* 11.2* 9.9* 9.9* 9.8*  HCT 35.0* 33.0* 32.9* 31.6* 31.1*  MCV 88.2  --  88.9 87.3 87.1  PLT 270  --  244 208 221   Cardiac Enzymes: No results for input(s): CKTOTAL, CKMB, CKMBINDEX, TROPONINI in the last 168 hours. BNP: Invalid input(s): POCBNP CBG: Recent Labs  Lab 05/28/21 0648 05/28/21 1136 05/28/21 1621 05/28/21 2146 05/29/21 0655  GLUCAP 73 116* 119* 125* 100*   D-Dimer No results for input(s): DDIMER in the last 72 hours. Hgb A1c Recent Labs    05/27/21 0620  HGBA1C 6.0*   Lipid Profile No results for input(s): CHOL, HDL, LDLCALC, TRIG, CHOLHDL, LDLDIRECT in the last 72 hours. Thyroid function studies No results for input(s): TSH, T4TOTAL, T3FREE, THYROIDAB in the last 72 hours.  Invalid input(s): FREET3 Anemia work up No results for input(s): VITAMINB12, FOLATE, FERRITIN, TIBC, IRON, RETICCTPCT in the last 72 hours. Urinalysis     Component Value Date/Time   COLORURINE YELLOW 05/26/2021 2122   APPEARANCEUR CLOUDY (A) 05/26/2021 2122   LABSPEC 1.017 05/26/2021 2122   PHURINE 5.0 05/26/2021 2122   GLUCOSEU NEGATIVE 05/26/2021 2122   GLUCOSEU NEGATIVE 10/22/2013 1621   HGBUR LARGE (A) 05/26/2021 2122   BILIRUBINUR NEGATIVE 05/26/2021 2122   KETONESUR 5 (A) 05/26/2021 2122   PROTEINUR NEGATIVE 05/26/2021 2122   UROBILINOGEN 1.0 03/27/2015 1534   NITRITE NEGATIVE 05/26/2021 2122   LEUKOCYTESUR LARGE (A) 05/26/2021 2122   Sepsis Labs Invalid input(s): PROCALCITONIN,  WBC,  LACTICIDVEN Microbiology Recent Results (from the past 240 hour(s))  Resp Panel by RT-PCR (Flu A&B, Covid) Nasopharyngeal Swab     Status: None   Collection Time: 05/26/21  2:35 PM   Specimen: Nasopharyngeal Swab; Nasopharyngeal(NP) swabs in vial transport medium  Result Value Ref Range Status   SARS Coronavirus 2 by RT PCR NEGATIVE NEGATIVE Final    Comment: (NOTE) SARS-CoV-2 target nucleic acids are NOT DETECTED.  The SARS-CoV-2 RNA is generally detectable in upper respiratory specimens during the acute phase of infection. The lowest concentration of SARS-CoV-2 viral copies this assay can detect is 138 copies/mL. A negative result does not preclude SARS-Cov-2 infection and should not be used as the sole basis for treatment or other patient management decisions. A negative result may occur with  improper specimen collection/handling, submission of specimen other than nasopharyngeal swab, presence of viral mutation(s) within the areas targeted by this assay, and inadequate number of viral copies(<138 copies/mL). A negative result must be combined with clinical observations, patient history, and epidemiological information. The expected result is Negative.  Fact Sheet for Patients:  EntrepreneurPulse.com.au  Fact Sheet for Healthcare Providers:  IncredibleEmployment.be  This test is no t yet  approved or cleared by the [place] FDA and  has been authorized for detection and/or diagnosis of SARS-CoV-2 by FDA under an Emergency Use Authorization (EUA). This EUA will remain  in effect (meaning this test can be used) for the duration of the COVID-19 declaration under Section 564(b)(1) of the Act, 21 U.S.C.section 360bbb-3(b)(1), unless the authorization is terminated  or revoked sooner.       Influenza A by PCR NEGATIVE NEGATIVE Final   Influenza B by PCR NEGATIVE NEGATIVE Final    Comment: (NOTE) The Xpert Xpress SARS-CoV-2/FLU/RSV plus assay is intended as an aid in the diagnosis of influenza from Nasopharyngeal swab specimens and should not be used as a sole basis for treatment. Nasal washings and aspirates are unacceptable for Xpert Xpress SARS-CoV-2/FLU/RSV testing.  Fact Sheet for Patients: EntrepreneurPulse.com.au  Fact Sheet for Healthcare Providers: IncredibleEmployment.be  This test is not yet approved or cleared by the  [place] Architectural technologist and has been authorized for detection and/or diagnosis of SARS-CoV-2 by FDA under an Print production planner (EUA). This EUA will remain in effect (meaning this test can be used) for the duration of the COVID-19 declaration under Section 564(b)(1) of the Act, 21 U.S.C. section 360bbb-3(b)(1), unless the authorization is terminated or revoked.  Performed at [facility], [place] [address] [place], [place] [address]   Urine Culture     Status: Abnormal   Collection Time: 05/26/21  9:22 PM   Specimen: Urine, Clean Catch  Result Value Ref Range Status   Specimen Description URINE, CLEAN CATCH  Final   Special Requests   Final    NONE Performed at [facility], [place] [address], [place], [place] [address]    Culture MULTIPLE SPECIES PRESENT, SUGGEST RECOLLECTION (A)  Final   Report Status 05/28/2021 FINAL  Final     Time coordinating discharge:  I have spent 35 minutes  face to face with the patient and on the ward discussing the patients care, assessment, plan and disposition with other care givers. >50% of the time was devoted counseling the patient about the risks and benefits of treatment/Discharge disposition and coordinating care.   SIGNED:   Damita Lack, MD  [facility] 05/29/2021, 7:49 AM   If 7PM-7AM, please contact night-coverage

## 2021-05-29 NOTE — NC FL2 (Signed)
Welcome LEVEL OF CARE SCREENING TOOL     IDENTIFICATION  Patient Name: Connie Ruiz Birthdate: Jul 31, 1960 Sex: female Admission Date (Current Location): 05/26/2021  [facility] and [place] Number:  Herbalist and Address:  [facility] [place], [place] [address] [place], [place] [address]      Provider Number: [id number]  Attending Physician Name and Address:  Damita Lack, MD  Relative Name and Phone Number:       Current Level of Care: Hospital Recommended Level of Care: [facility] Prior Approval Number:    Date Approved/Denied:   PASRR Number: [id number]  Discharge Plan: SNF    Current Diagnoses: Patient Active Problem List   Diagnosis Date Noted   AKI (acute kidney injury) ([place]) 05/26/2021   Rectal bleeding 05/26/2021   Anemia of chronic disease 05/26/2021   Prolonged QT interval 05/26/2021   Enteritis 01/27/2021   Hypothyroidism, 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   Hypothyroidism 01/14/2018   HLD (hyperlipidemia) 01/14/2018   CVA (cerebral vascular accident) ([place]) 01/12/2018   Chest pain 01/11/2018   Acute respiratory failure with hypoxia ([place]) 07/20/2017   Abdominal bloating 07/20/2017   Sinus tachycardia 07/20/2017   SLE (systemic lupus erythematosus related syndrome) ([place]) 07/20/2017   CAP (community acquired pneumonia) 07/16/2017   Screening examination for infectious disease 01/28/2016   Type 2 diabetes mellitus ([place]) 08/03/2015   Urinary tract infection 07/20/2015   Vascular dementia ([place]) 05/08/2015   Hemiparesis as late effect of cerebrovascular accident (CVA) ([place]) 05/08/2015   Aphasia as late effect of stroke 05/08/2015   C. difficile colitis 02/25/2015   Hypokalemia 02/25/2015   CKD (chronic kidney disease) 02/25/2015   Colitis 02/14/2015   Sepsis ([place]) 02/14/2015   Nausea vomiting and diarrhea 02/14/2015   Abdominal pain 02/14/2015   UTI (lower urinary tract infection) 02/14/2015   Abdominal  pain, chronic, generalized 01/07/2015   Candida esophagitis ([place]) 11/12/2014   Abnormal CT of the abdomen    Steroid-induced hyperglycemia 11/09/2014   Hypomagnesemia 11/06/2014   Acute abdominal pain    Supratherapeutic INR 11/02/2014   Jejunitis 11/02/2014   Myalgia and myositis 04/05/2014   Wellness examination 04/05/2014   Menopausal state 04/05/2014   Palpitations 08/31/2013   Hyperlipidemia 08/31/2013   Disorder of heart rhythm 08/13/2013   TIA (transient ischemic attack) 06/20/2013   Gout flare: R elbow and R shoulder 06/20/2013   Acute encephalopathy 06/14/2013   Rhabdomyolysis 06/14/2013   History of stroke with residual effects    Right sided weakness    Breast mass, left 04/30/2013   Contusion of knee, left 10/18/2012   Depression 10/05/2012   Encounter for long-term (current) use of other medications 10/04/2012   Multiple thyroid nodules 06/08/2012   Risk for falls 05/05/2012   Physical deconditioning 05/05/2012   Fever 04/27/2012   DVT (deep venous thrombosis) ([place]) 06/17/2011   Expressive aphasia 06/17/2011   Dysphasia 06/11/2011   Incontinence of urine 06/11/2011   Hand pain, left 06/11/2011   Immunosuppressive management encounter following kidney transplant 2011/06/09   Hypertension goal BP (blood pressure) < 130/80 06/09/2011   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   Arm pain, left 05/07/2011   Dysfunctional voiding of urine 04/28/2011   Urge incontinence 04/28/2011   Urinary urgency 04/28/2011   Routine general medical examination at a health care facility 04/13/2011   Anticoagulated 01/08/2011   Pulmonary embolism ([place]) 07/16/2010   Cerebral artery occlusion with cerebral infarction ([place]) 04/17/2010   CEREBROVASCULAR ACCIDENT, ACUTE 04/15/2010  THYROID NODULE, LEFT 04/10/2009   Cough 03/26/2009   ACUTE BRONCHITIS 08/22/2007   KIDNEY TRANSPLANTATION, HX OF 08/22/2007   Gout 08/21/2007   Essential hypertension 08/21/2007   Chronic  diastolic congestive heart failure ([place]) 08/21/2007   GERD 08/21/2007   Disorder resulting from impaired renal function 08/21/2007   LUPUS 08/21/2007   Osteoporosis 08/21/2007   DVT, HX OF 08/21/2007   Enteritis due to Clostridium difficile 08/21/2007    Orientation RESPIRATION BLADDER Height & Weight     Self  Normal Incontinent, External catheter Weight:   Height:  5\' 4"  (162.6 cm)  BEHAVIORAL SYMPTOMS/MOOD NEUROLOGICAL BOWEL NUTRITION STATUS      Incontinent Diet (see dc summary)  AMBULATORY STATUS COMMUNICATION OF NEEDS Skin   Extensive Assist Verbally Other (Comment) (non-pressure wound on elbow; wound on buttocks w/foam dressing)                       Personal Care Assistance Level of Assistance  Bathing, Feeding, Dressing Bathing Assistance: Maximum assistance Feeding assistance: Limited assistance Dressing Assistance: Limited assistance     Functional Limitations Info             SPECIAL CARE FACTORS FREQUENCY  PT (By licensed PT), OT (By licensed OT)     PT Frequency: 5x/week OT Frequency: 3x/week            Contractures Contractures Info: Not present    Additional Factors Info  Code Status, Allergies, Psychotropic, Insulin Sliding Scale Code Status Info: Full Allergies Info: Oxycodone-acetaminophen, Propoxyphene, Propoxyphene N-acetaminophen, Sulfonamide Derivatives, Codeine, Hydrocodone-acetaminophen, Hydromorphone, Other, Oxycodone, Sulfamethoxazole, Tape, Gabapentin, Latex, Metoprolol, Morphine, Morphine And Related, Rosiglitazone Psychotropic Info: Zoloft Insulin Sliding Scale Info: see dc summary       Current Medications (05/29/2021):  This is the current hospital active medication list Current Facility-Administered Medications  Medication Dose Route Frequency Provider Last Rate Last Admin   acetaminophen (TYLENOL) tablet 650 mg  650 mg Oral Q6H PRN Chotiner, Yevonne Aline, MD   650 mg at 05/28/21 4332   Or   acetaminophen (TYLENOL)  suppository 650 mg  650 mg Rectal Q6H PRN Chotiner, Yevonne Aline, MD       calcitRIOL (ROCALTROL) capsule 0.25 mcg  0.25 mcg Oral Daily Chotiner, Yevonne Aline, MD   0.25 mcg at 05/28/21 0902   carvedilol (COREG) tablet 6.25 mg  6.25 mg Oral BID WC Amin, Ankit Chirag, MD   6.25 mg at 05/28/21 1659   cefTRIAXone (ROCEPHIN) 1 g in sodium chloride 0.9 % 100 mL IVPB  1 g Intravenous Q24H Amin, Ankit Chirag, MD 200 mL/hr at 05/28/21 0917 1 g at 05/28/21 0917   cholestyramine (QUESTRAN) packet 4 g  4 g Oral Q24H Mansouraty, Telford Nab., MD       diltiazem (CARDIZEM CD) 24 hr capsule 360 mg  360 mg Oral Daily Amin, Ankit Chirag, MD   360 mg at 05/28/21 0902   donepezil (ARICEPT) tablet 10 mg  10 mg Oral QHS Amin, Ankit Chirag, MD   10 mg at 05/28/21 2333   famotidine (PEPCID) tablet 20 mg  20 mg Oral Daily Chotiner, Yevonne Aline, MD   20 mg at 05/28/21 1309   hydrALAZINE (APRESOLINE) injection 10 mg  10 mg Intravenous Q4H PRN Amin, Ankit Chirag, MD       hydrocortisone (ANUSOL-HC) suppository 25 mg  25 mg Rectal QHS Vena Rua, PA-C   25 mg at 05/28/21 2332   insulin aspart (novoLOG) injection 0-9 Units  0-9 Units Subcutaneous TID WC & HS Chotiner, Yevonne Aline, MD       ipratropium-albuterol (DUONEB) 0.5-2.5 (3) MG/3ML nebulizer solution 3 mL  3 mL Nebulization Q4H PRN Amin, Ankit Chirag, MD       levothyroxine (SYNTHROID) tablet 150 mcg  150 mcg Oral Q0600 Chotiner, Yevonne Aline, MD   150 mcg at 05/29/21 0604   metoprolol tartrate (LOPRESSOR) injection 5 mg  5 mg Intravenous Q4H PRN Amin, Ankit Chirag, MD       mirtazapine (REMERON) tablet 30 mg  30 mg Oral QHS Amin, Ankit Chirag, MD   30 mg at 05/28/21 2333   nystatin (MYCOSTATIN/NYSTOP) topical powder   Topical BID Damita Lack, MD   Given at 05/28/21 2332   pantoprazole (PROTONIX) EC tablet 40 mg  40 mg Oral Daily Amin, Ankit Chirag, MD   40 mg at 05/28/21 0902   predniSONE (DELTASONE) tablet 5 mg  5 mg Oral Q breakfast Amin, Ankit Chirag, MD   5 mg at  05/28/21 0902   senna-docusate (Senokot-S) tablet 1 tablet  1 tablet Oral QHS PRN Amin, Ankit Chirag, MD       sertraline (ZOLOFT) tablet 50 mg  50 mg Oral Daily Chotiner, Yevonne Aline, MD   50 mg at 05/28/21 7591   tacrolimus (PROGRAF) capsule 2.5 mg  2.5 mg Oral BID Amin, Ankit Chirag, MD   2.5 mg at 05/28/21 2333   traZODone (DESYREL) tablet 50 mg  50 mg Oral QHS PRN Damita Lack, MD   50 mg at 05/27/21 2249   Warfarin - Pharmacist Dosing Inpatient   Does not apply [place], [facility]         Discharge Medications: Please see discharge summary for a list of discharge medications.  Relevant Imaging Results:  Relevant Lab Results:   Additional Information SS# [place] Zekiel Torian, LCSW

## 2021-05-29 NOTE — Progress Notes (Signed)
DISCHARGE NOTE , Mean C. Urbieta to be discharged Skilled nursing facility , [facility] per MD order. Patient verbalized understanding.  Skin clean, dry and intact without evidence of skin break down, no evidence of skin tears noted. IV catheter discontinued intact. Site without signs and symptoms of complications. Dressing and pressure applied. Pt denies pain at the site currently. No complaints noted.  Patient free of lines, drains, and wounds.  Discharge packet assembled. An After Visit Summary (AVS) was printed and given to the EMS personnel. Patient escorted via stretcher and discharged to [facility] via ambulance. Report called to accepting facility; all questions and concerns addressed.  Rockie Neighbours RN

## 2021-05-29 NOTE — TOC Transition Note (Signed)
[facility] of [facility]) - CM/SW Discharge Note   Patient Details  Name: Connie Ruiz MRN: [id number] Date of Birth: January 04, 1961  [facility]) CM/SW Contact:  Benard Halsted, LCSW Phone Number: 05/29/2021, 1:41 PM   Clinical Narrative:    Patient will DC to: [facility] Anticipated DC date: 05/29/21 Family notified: Daughter Transport by: Corey Harold   Per MD patient ready for DC to [facility]. RN to call report prior to discharge [phone]). RN, patient, patient's family, and facility notified of DC. Discharge Summary and FL2 sent to facility. DC packet on chart. Ambulance transport requested for patient.   CSW will sign off for now as social work intervention is no longer needed. Please consult [place] again if new needs arise.     Final next level of care: Skilled Nursing Facility Barriers to Discharge: No Barriers Identified   Patient Goals and CMS Choice Patient states their goals for this hospitalization and ongoing recovery are:: Return to ltc CMS Medicare.gov Compare Post Acute Care list provided to:: Patient Represenative (must comment) Choice offered to / list presented to : Adult Children  Discharge Placement   Existing PASRR number confirmed : 05/29/21          Patient chooses bed at: [facility] Patient to be transferred to facility by: [place] Name of family member notified: Daugher Patient and family notified of of transfer: 05/29/21  Discharge Plan and Services In-house Referral: Clinical Social Work   Post Acute Care Choice: [place]                               Social Determinants of Health (SDOH) Interventions     Readmission Risk Interventions Readmission Risk Prevention Plan 05/29/2021  Transportation Screening Complete  Medication Review Press photographer) Complete  PCP or Specialist appointment within 3-5 days of discharge Complete  HRI or Home Care Consult Complete  SW Recovery Care/Counseling Consult  Complete  Palliative Care Screening Not [place] Complete  Some recent data might be hidden

## 2021-05-29 NOTE — Progress Notes (Signed)
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 Admitted with supratherapeutic INR of  5. Patient also noted with rectal bleeding which now appears to be hemorrhoidal and has stopped. Now on Anusol HC suppository qhs begun 12/21.    S/p Vit K 2.5mg  PO x 1 on 12/21 > INR down to 2.2 on 12/22 and warfarin resumed 12/21 with 3 mg x 1.  INR 2.3 today.  Goal of Therapy:  INR 2-3 Monitor platelets by anticoagulation protocol: Yes   Plan:  Discharging today. Unknown time of transport. Warfarin 3 mg x 1 again today. Discharging on prior regimen of 4 mg alternating with 3.5 mg. Would recheck PT/INR next week. If INR trends up, will need lower maintenance dose.  Connie Ruiz, [place] 05/29/2021,4:46 PM

## 2021-05-29 NOTE — Care Management Important Message (Signed)
Important Message  Patient Details  Name: Connie Ruiz MRN: [id number] Date of Birth: May 19, 1961   Medicare Important Message Given:  Yes     Orbie Pyo 05/29/2021, 3:24 PM

## 2021-05-30 DIAGNOSIS — T8611 Kidney transplant rejection: Secondary | ICD-10-CM | POA: Diagnosis not present

## 2021-05-30 DIAGNOSIS — E039 Hypothyroidism, unspecified: Secondary | ICD-10-CM | POA: Diagnosis not present

## 2021-05-30 DIAGNOSIS — I69351 Hemiplegia and hemiparesis following cerebral infarction affecting right dominant side: Secondary | ICD-10-CM | POA: Diagnosis not present

## 2021-05-30 DIAGNOSIS — R2681 Unsteadiness on feet: Secondary | ICD-10-CM | POA: Diagnosis not present

## 2021-05-30 DIAGNOSIS — I6330 Cerebral infarction due to thrombosis of unspecified cerebral artery: Secondary | ICD-10-CM | POA: Diagnosis not present

## 2021-05-30 DIAGNOSIS — E785 Hyperlipidemia, unspecified: Secondary | ICD-10-CM | POA: Diagnosis not present

## 2021-05-30 DIAGNOSIS — N390 Urinary tract infection, site not specified: Secondary | ICD-10-CM | POA: Diagnosis not present

## 2021-05-30 DIAGNOSIS — M3219 Other organ or system involvement in systemic lupus erythematosus: Secondary | ICD-10-CM | POA: Diagnosis not present

## 2021-05-30 DIAGNOSIS — I69353 Hemiplegia and hemiparesis following cerebral infarction affecting right non-dominant side: Secondary | ICD-10-CM | POA: Diagnosis not present

## 2021-05-30 DIAGNOSIS — M6281 Muscle weakness (generalized): Secondary | ICD-10-CM | POA: Diagnosis not present

## 2021-05-30 DIAGNOSIS — S37009A Unspecified injury of unspecified kidney, initial encounter: Secondary | ICD-10-CM | POA: Diagnosis not present

## 2021-05-30 DIAGNOSIS — E0822 Diabetes mellitus due to underlying condition with diabetic chronic kidney disease: Secondary | ICD-10-CM | POA: Diagnosis not present

## 2021-05-30 DIAGNOSIS — K649 Unspecified hemorrhoids: Secondary | ICD-10-CM | POA: Diagnosis not present

## 2021-05-30 DIAGNOSIS — N186 End stage renal disease: Secondary | ICD-10-CM | POA: Diagnosis not present

## 2021-05-30 DIAGNOSIS — D6861 Antiphospholipid syndrome: Secondary | ICD-10-CM | POA: Diagnosis not present

## 2021-05-30 DIAGNOSIS — D638 Anemia in other chronic diseases classified elsewhere: Secondary | ICD-10-CM | POA: Diagnosis not present

## 2021-06-02 DIAGNOSIS — K649 Unspecified hemorrhoids: Secondary | ICD-10-CM | POA: Diagnosis not present

## 2021-06-02 DIAGNOSIS — I6330 Cerebral infarction due to thrombosis of unspecified cerebral artery: Secondary | ICD-10-CM | POA: Diagnosis not present

## 2021-06-02 DIAGNOSIS — S37009A Unspecified injury of unspecified kidney, initial encounter: Secondary | ICD-10-CM | POA: Diagnosis not present

## 2021-06-02 DIAGNOSIS — D638 Anemia in other chronic diseases classified elsewhere: Secondary | ICD-10-CM | POA: Diagnosis not present

## 2021-06-02 DIAGNOSIS — N186 End stage renal disease: Secondary | ICD-10-CM | POA: Diagnosis not present

## 2021-06-02 DIAGNOSIS — N390 Urinary tract infection, site not specified: Secondary | ICD-10-CM | POA: Diagnosis not present

## 2021-06-02 DIAGNOSIS — I69351 Hemiplegia and hemiparesis following cerebral infarction affecting right dominant side: Secondary | ICD-10-CM | POA: Diagnosis not present

## 2021-06-03 ENCOUNTER — Encounter: Payer: Self-pay | Admitting: Family Medicine

## 2021-06-04 DIAGNOSIS — L24A9 Irritant contact dermatitis due friction or contact with other specified body fluids: Secondary | ICD-10-CM | POA: Diagnosis not present

## 2021-06-04 DIAGNOSIS — L89213 Pressure ulcer of right hip, stage 3: Secondary | ICD-10-CM | POA: Diagnosis not present

## 2021-06-04 DIAGNOSIS — L89010 Pressure ulcer of right elbow, unstageable: Secondary | ICD-10-CM | POA: Diagnosis not present

## 2021-06-05 DIAGNOSIS — N186 End stage renal disease: Secondary | ICD-10-CM | POA: Diagnosis not present

## 2021-06-05 DIAGNOSIS — D638 Anemia in other chronic diseases classified elsewhere: Secondary | ICD-10-CM | POA: Diagnosis not present

## 2021-06-05 DIAGNOSIS — I6330 Cerebral infarction due to thrombosis of unspecified cerebral artery: Secondary | ICD-10-CM | POA: Diagnosis not present

## 2021-06-05 DIAGNOSIS — S37009A Unspecified injury of unspecified kidney, initial encounter: Secondary | ICD-10-CM | POA: Diagnosis not present

## 2021-06-05 DIAGNOSIS — K649 Unspecified hemorrhoids: Secondary | ICD-10-CM | POA: Diagnosis not present

## 2021-06-05 DIAGNOSIS — N390 Urinary tract infection, site not specified: Secondary | ICD-10-CM | POA: Diagnosis not present

## 2021-06-05 DIAGNOSIS — I69351 Hemiplegia and hemiparesis following cerebral infarction affecting right dominant side: Secondary | ICD-10-CM | POA: Diagnosis not present

## 2021-06-08 DIAGNOSIS — N186 End stage renal disease: Secondary | ICD-10-CM | POA: Diagnosis not present

## 2021-06-08 DIAGNOSIS — R7989 Other specified abnormal findings of blood chemistry: Secondary | ICD-10-CM | POA: Diagnosis not present

## 2021-06-08 DIAGNOSIS — I5032 Chronic diastolic (congestive) heart failure: Secondary | ICD-10-CM | POA: Diagnosis not present

## 2021-06-08 DIAGNOSIS — N390 Urinary tract infection, site not specified: Secondary | ICD-10-CM | POA: Diagnosis not present

## 2021-06-08 DIAGNOSIS — I6330 Cerebral infarction due to thrombosis of unspecified cerebral artery: Secondary | ICD-10-CM | POA: Diagnosis not present

## 2021-06-08 DIAGNOSIS — D638 Anemia in other chronic diseases classified elsewhere: Secondary | ICD-10-CM | POA: Diagnosis not present

## 2021-06-08 DIAGNOSIS — I69351 Hemiplegia and hemiparesis following cerebral infarction affecting right dominant side: Secondary | ICD-10-CM | POA: Diagnosis not present

## 2021-06-08 DIAGNOSIS — S37009A Unspecified injury of unspecified kidney, initial encounter: Secondary | ICD-10-CM | POA: Diagnosis not present

## 2021-06-08 DIAGNOSIS — K649 Unspecified hemorrhoids: Secondary | ICD-10-CM | POA: Diagnosis not present

## 2021-06-10 DIAGNOSIS — N186 End stage renal disease: Secondary | ICD-10-CM | POA: Diagnosis not present

## 2021-06-10 DIAGNOSIS — S37009A Unspecified injury of unspecified kidney, initial encounter: Secondary | ICD-10-CM | POA: Diagnosis not present

## 2021-06-10 DIAGNOSIS — I5032 Chronic diastolic (congestive) heart failure: Secondary | ICD-10-CM | POA: Diagnosis not present

## 2021-06-10 DIAGNOSIS — I6330 Cerebral infarction due to thrombosis of unspecified cerebral artery: Secondary | ICD-10-CM | POA: Diagnosis not present

## 2021-06-10 DIAGNOSIS — I69351 Hemiplegia and hemiparesis following cerebral infarction affecting right dominant side: Secondary | ICD-10-CM | POA: Diagnosis not present

## 2021-06-10 DIAGNOSIS — K649 Unspecified hemorrhoids: Secondary | ICD-10-CM | POA: Diagnosis not present

## 2021-06-10 DIAGNOSIS — N390 Urinary tract infection, site not specified: Secondary | ICD-10-CM | POA: Diagnosis not present

## 2021-06-10 DIAGNOSIS — D638 Anemia in other chronic diseases classified elsewhere: Secondary | ICD-10-CM | POA: Diagnosis not present

## 2021-06-11 DIAGNOSIS — L89213 Pressure ulcer of right hip, stage 3: Secondary | ICD-10-CM | POA: Diagnosis not present

## 2021-06-11 DIAGNOSIS — K649 Unspecified hemorrhoids: Secondary | ICD-10-CM | POA: Diagnosis not present

## 2021-06-11 DIAGNOSIS — N178 Other acute kidney failure: Secondary | ICD-10-CM | POA: Diagnosis not present

## 2021-06-11 DIAGNOSIS — D638 Anemia in other chronic diseases classified elsewhere: Secondary | ICD-10-CM | POA: Diagnosis not present

## 2021-06-11 DIAGNOSIS — I69351 Hemiplegia and hemiparesis following cerebral infarction affecting right dominant side: Secondary | ICD-10-CM | POA: Diagnosis not present

## 2021-06-11 DIAGNOSIS — N390 Urinary tract infection, site not specified: Secondary | ICD-10-CM | POA: Diagnosis not present

## 2021-06-11 DIAGNOSIS — N186 End stage renal disease: Secondary | ICD-10-CM | POA: Diagnosis not present

## 2021-06-12 DIAGNOSIS — D638 Anemia in other chronic diseases classified elsewhere: Secondary | ICD-10-CM | POA: Diagnosis not present

## 2021-06-12 DIAGNOSIS — S37009A Unspecified injury of unspecified kidney, initial encounter: Secondary | ICD-10-CM | POA: Diagnosis not present

## 2021-06-12 DIAGNOSIS — N186 End stage renal disease: Secondary | ICD-10-CM | POA: Diagnosis not present

## 2021-06-12 DIAGNOSIS — I6330 Cerebral infarction due to thrombosis of unspecified cerebral artery: Secondary | ICD-10-CM | POA: Diagnosis not present

## 2021-06-12 DIAGNOSIS — N390 Urinary tract infection, site not specified: Secondary | ICD-10-CM | POA: Diagnosis not present

## 2021-06-12 DIAGNOSIS — I69351 Hemiplegia and hemiparesis following cerebral infarction affecting right dominant side: Secondary | ICD-10-CM | POA: Diagnosis not present

## 2021-06-12 DIAGNOSIS — K649 Unspecified hemorrhoids: Secondary | ICD-10-CM | POA: Diagnosis not present

## 2021-06-12 DIAGNOSIS — I5032 Chronic diastolic (congestive) heart failure: Secondary | ICD-10-CM | POA: Diagnosis not present

## 2021-06-13 DIAGNOSIS — Z7901 Long term (current) use of anticoagulants: Secondary | ICD-10-CM | POA: Diagnosis not present

## 2021-06-13 DIAGNOSIS — R7989 Other specified abnormal findings of blood chemistry: Secondary | ICD-10-CM | POA: Diagnosis not present

## 2021-06-16 DIAGNOSIS — N390 Urinary tract infection, site not specified: Secondary | ICD-10-CM | POA: Diagnosis not present

## 2021-06-16 DIAGNOSIS — I69351 Hemiplegia and hemiparesis following cerebral infarction affecting right dominant side: Secondary | ICD-10-CM | POA: Diagnosis not present

## 2021-06-16 DIAGNOSIS — I6330 Cerebral infarction due to thrombosis of unspecified cerebral artery: Secondary | ICD-10-CM | POA: Diagnosis not present

## 2021-06-16 DIAGNOSIS — N186 End stage renal disease: Secondary | ICD-10-CM | POA: Diagnosis not present

## 2021-06-16 DIAGNOSIS — I110 Hypertensive heart disease with heart failure: Secondary | ICD-10-CM | POA: Diagnosis not present

## 2021-06-16 DIAGNOSIS — L89219 Pressure ulcer of right hip, unspecified stage: Secondary | ICD-10-CM | POA: Diagnosis not present

## 2021-06-16 DIAGNOSIS — D638 Anemia in other chronic diseases classified elsewhere: Secondary | ICD-10-CM | POA: Diagnosis not present

## 2021-06-16 DIAGNOSIS — S37009A Unspecified injury of unspecified kidney, initial encounter: Secondary | ICD-10-CM | POA: Diagnosis not present

## 2021-06-16 DIAGNOSIS — K649 Unspecified hemorrhoids: Secondary | ICD-10-CM | POA: Diagnosis not present

## 2021-06-16 DIAGNOSIS — I5032 Chronic diastolic (congestive) heart failure: Secondary | ICD-10-CM | POA: Diagnosis not present

## 2021-06-17 DIAGNOSIS — R7989 Other specified abnormal findings of blood chemistry: Secondary | ICD-10-CM | POA: Diagnosis not present

## 2021-06-18 DIAGNOSIS — L89213 Pressure ulcer of right hip, stage 3: Secondary | ICD-10-CM | POA: Diagnosis not present

## 2021-06-19 DIAGNOSIS — N186 End stage renal disease: Secondary | ICD-10-CM | POA: Diagnosis not present

## 2021-06-19 DIAGNOSIS — Z944 Liver transplant status: Secondary | ICD-10-CM | POA: Diagnosis not present

## 2021-06-19 DIAGNOSIS — Z940 Kidney transplant status: Secondary | ICD-10-CM | POA: Diagnosis not present

## 2021-06-22 DIAGNOSIS — Z7901 Long term (current) use of anticoagulants: Secondary | ICD-10-CM | POA: Diagnosis not present

## 2021-06-24 DIAGNOSIS — L89213 Pressure ulcer of right hip, stage 3: Secondary | ICD-10-CM | POA: Diagnosis not present

## 2021-06-25 DIAGNOSIS — L89213 Pressure ulcer of right hip, stage 3: Secondary | ICD-10-CM | POA: Diagnosis not present

## 2021-07-01 DIAGNOSIS — Z4822 Encounter for aftercare following kidney transplant: Secondary | ICD-10-CM | POA: Diagnosis not present

## 2021-07-01 DIAGNOSIS — E785 Hyperlipidemia, unspecified: Secondary | ICD-10-CM | POA: Diagnosis not present

## 2021-07-01 DIAGNOSIS — R109 Unspecified abdominal pain: Secondary | ICD-10-CM | POA: Diagnosis not present

## 2021-07-01 DIAGNOSIS — D649 Anemia, unspecified: Secondary | ICD-10-CM | POA: Diagnosis not present

## 2021-07-01 DIAGNOSIS — Z7984 Long term (current) use of oral hypoglycemic drugs: Secondary | ICD-10-CM | POA: Diagnosis not present

## 2021-07-01 DIAGNOSIS — Z79899 Other long term (current) drug therapy: Secondary | ICD-10-CM | POA: Diagnosis not present

## 2021-07-01 DIAGNOSIS — M109 Gout, unspecified: Secondary | ICD-10-CM | POA: Diagnosis not present

## 2021-07-01 DIAGNOSIS — R197 Diarrhea, unspecified: Secondary | ICD-10-CM | POA: Diagnosis not present

## 2021-07-01 DIAGNOSIS — Z7901 Long term (current) use of anticoagulants: Secondary | ICD-10-CM | POA: Diagnosis not present

## 2021-07-01 DIAGNOSIS — I10 Essential (primary) hypertension: Secondary | ICD-10-CM | POA: Diagnosis not present

## 2021-07-01 DIAGNOSIS — Z940 Kidney transplant status: Secondary | ICD-10-CM | POA: Diagnosis not present

## 2021-07-01 DIAGNOSIS — Z79621 Long term (current) use of calcineurin inhibitor: Secondary | ICD-10-CM | POA: Diagnosis not present

## 2021-07-06 DIAGNOSIS — I110 Hypertensive heart disease with heart failure: Secondary | ICD-10-CM | POA: Diagnosis not present

## 2021-07-06 DIAGNOSIS — N186 End stage renal disease: Secondary | ICD-10-CM | POA: Diagnosis not present

## 2021-07-06 DIAGNOSIS — S37009A Unspecified injury of unspecified kidney, initial encounter: Secondary | ICD-10-CM | POA: Diagnosis not present

## 2021-07-06 DIAGNOSIS — I69351 Hemiplegia and hemiparesis following cerebral infarction affecting right dominant side: Secondary | ICD-10-CM | POA: Diagnosis not present

## 2021-07-06 DIAGNOSIS — N178 Other acute kidney failure: Secondary | ICD-10-CM | POA: Diagnosis not present

## 2021-07-06 DIAGNOSIS — I5032 Chronic diastolic (congestive) heart failure: Secondary | ICD-10-CM | POA: Diagnosis not present

## 2021-07-06 DIAGNOSIS — L89219 Pressure ulcer of right hip, unspecified stage: Secondary | ICD-10-CM | POA: Diagnosis not present

## 2021-07-06 DIAGNOSIS — E1122 Type 2 diabetes mellitus with diabetic chronic kidney disease: Secondary | ICD-10-CM | POA: Diagnosis not present

## 2021-07-06 DIAGNOSIS — K649 Unspecified hemorrhoids: Secondary | ICD-10-CM | POA: Diagnosis not present

## 2021-07-06 DIAGNOSIS — I6330 Cerebral infarction due to thrombosis of unspecified cerebral artery: Secondary | ICD-10-CM | POA: Diagnosis not present

## 2021-07-06 DIAGNOSIS — D638 Anemia in other chronic diseases classified elsewhere: Secondary | ICD-10-CM | POA: Diagnosis not present

## 2021-07-06 DIAGNOSIS — N390 Urinary tract infection, site not specified: Secondary | ICD-10-CM | POA: Diagnosis not present

## 2021-07-08 DIAGNOSIS — E039 Hypothyroidism, unspecified: Secondary | ICD-10-CM | POA: Diagnosis not present

## 2021-07-08 DIAGNOSIS — Z9049 Acquired absence of other specified parts of digestive tract: Secondary | ICD-10-CM | POA: Diagnosis not present

## 2021-07-08 DIAGNOSIS — Z79899 Other long term (current) drug therapy: Secondary | ICD-10-CM | POA: Diagnosis not present

## 2021-07-08 DIAGNOSIS — D84821 Immunodeficiency due to drugs: Secondary | ICD-10-CM | POA: Diagnosis not present

## 2021-07-08 DIAGNOSIS — R1084 Generalized abdominal pain: Secondary | ICD-10-CM | POA: Diagnosis not present

## 2021-07-08 DIAGNOSIS — E785 Hyperlipidemia, unspecified: Secondary | ICD-10-CM | POA: Diagnosis not present

## 2021-07-08 DIAGNOSIS — Z7901 Long term (current) use of anticoagulants: Secondary | ICD-10-CM | POA: Diagnosis not present

## 2021-07-08 DIAGNOSIS — M3214 Glomerular disease in systemic lupus erythematosus: Secondary | ICD-10-CM | POA: Diagnosis not present

## 2021-07-08 DIAGNOSIS — R197 Diarrhea, unspecified: Secondary | ICD-10-CM | POA: Diagnosis not present

## 2021-07-08 DIAGNOSIS — K529 Noninfective gastroenteritis and colitis, unspecified: Secondary | ICD-10-CM | POA: Diagnosis not present

## 2021-07-08 DIAGNOSIS — Z794 Long term (current) use of insulin: Secondary | ICD-10-CM | POA: Diagnosis not present

## 2021-07-08 DIAGNOSIS — Z940 Kidney transplant status: Secondary | ICD-10-CM | POA: Diagnosis not present

## 2021-07-08 DIAGNOSIS — E1122 Type 2 diabetes mellitus with diabetic chronic kidney disease: Secondary | ICD-10-CM | POA: Diagnosis not present

## 2021-07-16 DIAGNOSIS — N186 End stage renal disease: Secondary | ICD-10-CM | POA: Diagnosis not present

## 2021-07-16 DIAGNOSIS — K649 Unspecified hemorrhoids: Secondary | ICD-10-CM | POA: Diagnosis not present

## 2021-07-16 DIAGNOSIS — I5032 Chronic diastolic (congestive) heart failure: Secondary | ICD-10-CM | POA: Diagnosis not present

## 2021-07-16 DIAGNOSIS — I69351 Hemiplegia and hemiparesis following cerebral infarction affecting right dominant side: Secondary | ICD-10-CM | POA: Diagnosis not present

## 2021-07-16 DIAGNOSIS — N390 Urinary tract infection, site not specified: Secondary | ICD-10-CM | POA: Diagnosis not present

## 2021-07-16 DIAGNOSIS — E038 Other specified hypothyroidism: Secondary | ICD-10-CM | POA: Diagnosis not present

## 2021-07-16 DIAGNOSIS — D638 Anemia in other chronic diseases classified elsewhere: Secondary | ICD-10-CM | POA: Diagnosis not present

## 2021-07-18 DIAGNOSIS — R1084 Generalized abdominal pain: Secondary | ICD-10-CM | POA: Diagnosis not present

## 2021-07-18 DIAGNOSIS — R197 Diarrhea, unspecified: Secondary | ICD-10-CM | POA: Diagnosis not present

## 2021-07-18 DIAGNOSIS — K922 Gastrointestinal hemorrhage, unspecified: Secondary | ICD-10-CM | POA: Diagnosis not present

## 2021-07-21 DIAGNOSIS — I5020 Unspecified systolic (congestive) heart failure: Secondary | ICD-10-CM | POA: Diagnosis not present

## 2021-07-24 DIAGNOSIS — D638 Anemia in other chronic diseases classified elsewhere: Secondary | ICD-10-CM | POA: Diagnosis not present

## 2021-07-24 DIAGNOSIS — R7889 Finding of other specified substances, not normally found in blood: Secondary | ICD-10-CM | POA: Diagnosis not present

## 2021-07-24 DIAGNOSIS — K922 Gastrointestinal hemorrhage, unspecified: Secondary | ICD-10-CM | POA: Diagnosis not present

## 2021-07-25 DIAGNOSIS — R7889 Finding of other specified substances, not normally found in blood: Secondary | ICD-10-CM | POA: Diagnosis not present

## 2021-07-28 DIAGNOSIS — Z7901 Long term (current) use of anticoagulants: Secondary | ICD-10-CM | POA: Diagnosis not present

## 2021-07-28 DIAGNOSIS — R7989 Other specified abnormal findings of blood chemistry: Secondary | ICD-10-CM | POA: Diagnosis not present

## 2021-08-01 DIAGNOSIS — R791 Abnormal coagulation profile: Secondary | ICD-10-CM | POA: Diagnosis not present

## 2021-08-02 ENCOUNTER — Inpatient Hospital Stay (HOSPITAL_COMMUNITY): Payer: Medicare Other

## 2021-08-02 ENCOUNTER — Inpatient Hospital Stay (HOSPITAL_COMMUNITY)
Admission: EM | Admit: 2021-08-02 | Discharge: 2021-08-14 | DRG: 871 | Disposition: A | Discharge status: Hospice | Payer: Medicare Other | Source: Skilled Nursing Facility | Attending: Internal Medicine | Admitting: Internal Medicine

## 2021-08-02 ENCOUNTER — Emergency Department (HOSPITAL_COMMUNITY): Payer: Medicare Other

## 2021-08-02 ENCOUNTER — Inpatient Hospital Stay: Payer: Self-pay

## 2021-08-02 DIAGNOSIS — J9601 Acute respiratory failure with hypoxia: Secondary | ICD-10-CM | POA: Diagnosis not present

## 2021-08-02 DIAGNOSIS — T8619 Other complication of kidney transplant: Secondary | ICD-10-CM | POA: Diagnosis not present

## 2021-08-02 DIAGNOSIS — I4891 Unspecified atrial fibrillation: Secondary | ICD-10-CM | POA: Diagnosis not present

## 2021-08-02 DIAGNOSIS — J449 Chronic obstructive pulmonary disease, unspecified: Secondary | ICD-10-CM | POA: Diagnosis not present

## 2021-08-02 DIAGNOSIS — A419 Sepsis, unspecified organism: Principal | ICD-10-CM | POA: Diagnosis present

## 2021-08-02 DIAGNOSIS — J189 Pneumonia, unspecified organism: Secondary | ICD-10-CM | POA: Diagnosis present

## 2021-08-02 DIAGNOSIS — I130 Hypertensive heart and chronic kidney disease with heart failure and stage 1 through stage 4 chronic kidney disease, or unspecified chronic kidney disease: Secondary | ICD-10-CM | POA: Diagnosis present

## 2021-08-02 DIAGNOSIS — Z79890 Hormone replacement therapy (postmenopausal): Secondary | ICD-10-CM

## 2021-08-02 DIAGNOSIS — J9811 Atelectasis: Secondary | ICD-10-CM | POA: Diagnosis not present

## 2021-08-02 DIAGNOSIS — I5030 Unspecified diastolic (congestive) heart failure: Secondary | ICD-10-CM | POA: Diagnosis not present

## 2021-08-02 DIAGNOSIS — Z825 Family history of asthma and other chronic lower respiratory diseases: Secondary | ICD-10-CM

## 2021-08-02 DIAGNOSIS — Z531 Procedure and treatment not carried out because of patient's decision for reasons of belief and group pressure: Secondary | ICD-10-CM | POA: Diagnosis present

## 2021-08-02 DIAGNOSIS — I6381 Other cerebral infarction due to occlusion or stenosis of small artery: Secondary | ICD-10-CM | POA: Diagnosis not present

## 2021-08-02 DIAGNOSIS — I5031 Acute diastolic (congestive) heart failure: Secondary | ICD-10-CM

## 2021-08-02 DIAGNOSIS — Z4659 Encounter for fitting and adjustment of other gastrointestinal appliance and device: Secondary | ICD-10-CM | POA: Diagnosis not present

## 2021-08-02 DIAGNOSIS — R000 Tachycardia, unspecified: Secondary | ICD-10-CM | POA: Diagnosis not present

## 2021-08-02 DIAGNOSIS — M810 Age-related osteoporosis without current pathological fracture: Secondary | ICD-10-CM | POA: Diagnosis present

## 2021-08-02 DIAGNOSIS — R0682 Tachypnea, not elsewhere classified: Secondary | ICD-10-CM

## 2021-08-02 DIAGNOSIS — E440 Moderate protein-calorie malnutrition: Secondary | ICD-10-CM | POA: Diagnosis present

## 2021-08-02 DIAGNOSIS — N186 End stage renal disease: Secondary | ICD-10-CM | POA: Diagnosis not present

## 2021-08-02 DIAGNOSIS — R1031 Right lower quadrant pain: Secondary | ICD-10-CM | POA: Diagnosis not present

## 2021-08-02 DIAGNOSIS — Z743 Need for continuous supervision: Secondary | ICD-10-CM | POA: Diagnosis not present

## 2021-08-02 DIAGNOSIS — R0602 Shortness of breath: Secondary | ICD-10-CM | POA: Diagnosis not present

## 2021-08-02 DIAGNOSIS — R509 Fever, unspecified: Secondary | ICD-10-CM | POA: Diagnosis not present

## 2021-08-02 DIAGNOSIS — R109 Unspecified abdominal pain: Secondary | ICD-10-CM

## 2021-08-02 DIAGNOSIS — I700 Atherosclerosis of aorta: Secondary | ICD-10-CM | POA: Diagnosis present

## 2021-08-02 DIAGNOSIS — I248 Other forms of acute ischemic heart disease: Secondary | ICD-10-CM | POA: Diagnosis not present

## 2021-08-02 DIAGNOSIS — Z882 Allergy status to sulfonamides status: Secondary | ICD-10-CM

## 2021-08-02 DIAGNOSIS — D649 Anemia, unspecified: Secondary | ICD-10-CM | POA: Diagnosis not present

## 2021-08-02 DIAGNOSIS — E1122 Type 2 diabetes mellitus with diabetic chronic kidney disease: Secondary | ICD-10-CM | POA: Diagnosis not present

## 2021-08-02 DIAGNOSIS — I69351 Hemiplegia and hemiparesis following cerebral infarction affecting right dominant side: Secondary | ICD-10-CM

## 2021-08-02 DIAGNOSIS — I771 Stricture of artery: Secondary | ICD-10-CM | POA: Diagnosis not present

## 2021-08-02 DIAGNOSIS — Z91040 Latex allergy status: Secondary | ICD-10-CM

## 2021-08-02 DIAGNOSIS — I6930 Unspecified sequelae of cerebral infarction: Secondary | ICD-10-CM | POA: Diagnosis not present

## 2021-08-02 DIAGNOSIS — Z452 Encounter for adjustment and management of vascular access device: Secondary | ICD-10-CM | POA: Diagnosis not present

## 2021-08-02 DIAGNOSIS — I517 Cardiomegaly: Secondary | ICD-10-CM | POA: Diagnosis not present

## 2021-08-02 DIAGNOSIS — N170 Acute kidney failure with tubular necrosis: Secondary | ICD-10-CM | POA: Diagnosis not present

## 2021-08-02 DIAGNOSIS — E1165 Type 2 diabetes mellitus with hyperglycemia: Secondary | ICD-10-CM | POA: Diagnosis not present

## 2021-08-02 DIAGNOSIS — I2782 Chronic pulmonary embolism: Secondary | ICD-10-CM | POA: Diagnosis not present

## 2021-08-02 DIAGNOSIS — R6521 Severe sepsis with septic shock: Secondary | ICD-10-CM | POA: Diagnosis present

## 2021-08-02 DIAGNOSIS — N1831 Chronic kidney disease, stage 3a: Secondary | ICD-10-CM | POA: Diagnosis not present

## 2021-08-02 DIAGNOSIS — Z794 Long term (current) use of insulin: Secondary | ICD-10-CM

## 2021-08-02 DIAGNOSIS — Z8249 Family history of ischemic heart disease and other diseases of the circulatory system: Secondary | ICD-10-CM

## 2021-08-02 DIAGNOSIS — R404 Transient alteration of awareness: Secondary | ICD-10-CM | POA: Diagnosis not present

## 2021-08-02 DIAGNOSIS — Z888 Allergy status to other drugs, medicaments and biological substances status: Secondary | ICD-10-CM

## 2021-08-02 DIAGNOSIS — Z7952 Long term (current) use of systemic steroids: Secondary | ICD-10-CM

## 2021-08-02 DIAGNOSIS — F0150 Vascular dementia without behavioral disturbance: Secondary | ICD-10-CM | POA: Diagnosis present

## 2021-08-02 DIAGNOSIS — E8809 Other disorders of plasma-protein metabolism, not elsewhere classified: Secondary | ICD-10-CM | POA: Diagnosis present

## 2021-08-02 DIAGNOSIS — I69320 Aphasia following cerebral infarction: Secondary | ICD-10-CM

## 2021-08-02 DIAGNOSIS — E119 Type 2 diabetes mellitus without complications: Secondary | ICD-10-CM

## 2021-08-02 DIAGNOSIS — Z6838 Body mass index (BMI) 38.0-38.9, adult: Secondary | ICD-10-CM

## 2021-08-02 DIAGNOSIS — M329 Systemic lupus erythematosus, unspecified: Secondary | ICD-10-CM | POA: Diagnosis not present

## 2021-08-02 DIAGNOSIS — N178 Other acute kidney failure: Secondary | ICD-10-CM | POA: Diagnosis not present

## 2021-08-02 DIAGNOSIS — R0603 Acute respiratory distress: Secondary | ICD-10-CM | POA: Diagnosis not present

## 2021-08-02 DIAGNOSIS — J690 Pneumonitis due to inhalation of food and vomit: Secondary | ICD-10-CM | POA: Diagnosis present

## 2021-08-02 DIAGNOSIS — I69322 Dysarthria following cerebral infarction: Secondary | ICD-10-CM

## 2021-08-02 DIAGNOSIS — E876 Hypokalemia: Secondary | ICD-10-CM | POA: Diagnosis not present

## 2021-08-02 DIAGNOSIS — M47814 Spondylosis without myelopathy or radiculopathy, thoracic region: Secondary | ICD-10-CM | POA: Diagnosis not present

## 2021-08-02 DIAGNOSIS — E039 Hypothyroidism, unspecified: Secondary | ICD-10-CM | POA: Diagnosis not present

## 2021-08-02 DIAGNOSIS — I5041 Acute combined systolic (congestive) and diastolic (congestive) heart failure: Secondary | ICD-10-CM | POA: Diagnosis not present

## 2021-08-02 DIAGNOSIS — Z885 Allergy status to narcotic agent status: Secondary | ICD-10-CM

## 2021-08-02 DIAGNOSIS — R4189 Other symptoms and signs involving cognitive functions and awareness: Secondary | ICD-10-CM | POA: Diagnosis present

## 2021-08-02 DIAGNOSIS — Z992 Dependence on renal dialysis: Secondary | ICD-10-CM | POA: Diagnosis not present

## 2021-08-02 DIAGNOSIS — R7989 Other specified abnormal findings of blood chemistry: Secondary | ICD-10-CM | POA: Diagnosis not present

## 2021-08-02 DIAGNOSIS — Z7189 Other specified counseling: Secondary | ICD-10-CM | POA: Diagnosis not present

## 2021-08-02 DIAGNOSIS — R29810 Facial weakness: Secondary | ICD-10-CM | POA: Diagnosis present

## 2021-08-02 DIAGNOSIS — R0902 Hypoxemia: Secondary | ICD-10-CM | POA: Diagnosis not present

## 2021-08-02 DIAGNOSIS — Z515 Encounter for palliative care: Secondary | ICD-10-CM | POA: Diagnosis not present

## 2021-08-02 DIAGNOSIS — Z4682 Encounter for fitting and adjustment of non-vascular catheter: Secondary | ICD-10-CM | POA: Diagnosis not present

## 2021-08-02 DIAGNOSIS — Z66 Do not resuscitate: Secondary | ICD-10-CM | POA: Diagnosis not present

## 2021-08-02 DIAGNOSIS — J9801 Acute bronchospasm: Secondary | ICD-10-CM | POA: Diagnosis not present

## 2021-08-02 DIAGNOSIS — Z20822 Contact with and (suspected) exposure to covid-19: Secondary | ICD-10-CM | POA: Diagnosis not present

## 2021-08-02 DIAGNOSIS — R0689 Other abnormalities of breathing: Secondary | ICD-10-CM | POA: Diagnosis not present

## 2021-08-02 DIAGNOSIS — J3489 Other specified disorders of nose and nasal sinuses: Secondary | ICD-10-CM | POA: Diagnosis not present

## 2021-08-02 DIAGNOSIS — R34 Anuria and oliguria: Secondary | ICD-10-CM | POA: Diagnosis not present

## 2021-08-02 DIAGNOSIS — L89151 Pressure ulcer of sacral region, stage 1: Secondary | ICD-10-CM | POA: Diagnosis present

## 2021-08-02 DIAGNOSIS — Z7901 Long term (current) use of anticoagulants: Secondary | ICD-10-CM

## 2021-08-02 DIAGNOSIS — Z79899 Other long term (current) drug therapy: Secondary | ICD-10-CM

## 2021-08-02 DIAGNOSIS — Z993 Dependence on wheelchair: Secondary | ICD-10-CM

## 2021-08-02 DIAGNOSIS — I421 Obstructive hypertrophic cardiomyopathy: Secondary | ICD-10-CM | POA: Diagnosis present

## 2021-08-02 DIAGNOSIS — I10 Essential (primary) hypertension: Secondary | ICD-10-CM | POA: Diagnosis present

## 2021-08-02 DIAGNOSIS — Z978 Presence of other specified devices: Secondary | ICD-10-CM

## 2021-08-02 DIAGNOSIS — E870 Hyperosmolality and hypernatremia: Secondary | ICD-10-CM | POA: Diagnosis not present

## 2021-08-02 DIAGNOSIS — D6861 Antiphospholipid syndrome: Secondary | ICD-10-CM | POA: Diagnosis present

## 2021-08-02 DIAGNOSIS — G9341 Metabolic encephalopathy: Secondary | ICD-10-CM | POA: Diagnosis present

## 2021-08-02 DIAGNOSIS — J90 Pleural effusion, not elsewhere classified: Secondary | ICD-10-CM | POA: Diagnosis not present

## 2021-08-02 DIAGNOSIS — I672 Cerebral atherosclerosis: Secondary | ICD-10-CM | POA: Diagnosis not present

## 2021-08-02 DIAGNOSIS — Z940 Kidney transplant status: Secondary | ICD-10-CM

## 2021-08-02 DIAGNOSIS — Y830 Surgical operation with transplant of whole organ as the cause of abnormal reaction of the patient, or of later complication, without mention of misadventure at the time of the procedure: Secondary | ICD-10-CM | POA: Diagnosis present

## 2021-08-02 DIAGNOSIS — I499 Cardiac arrhythmia, unspecified: Secondary | ICD-10-CM | POA: Diagnosis not present

## 2021-08-02 DIAGNOSIS — E669 Obesity, unspecified: Secondary | ICD-10-CM | POA: Diagnosis present

## 2021-08-02 DIAGNOSIS — K219 Gastro-esophageal reflux disease without esophagitis: Secondary | ICD-10-CM | POA: Diagnosis present

## 2021-08-02 DIAGNOSIS — R29818 Other symptoms and signs involving the nervous system: Secondary | ICD-10-CM | POA: Diagnosis not present

## 2021-08-02 DIAGNOSIS — Z7983 Long term (current) use of bisphosphonates: Secondary | ICD-10-CM

## 2021-08-02 DIAGNOSIS — R791 Abnormal coagulation profile: Secondary | ICD-10-CM | POA: Diagnosis present

## 2021-08-02 DIAGNOSIS — I6523 Occlusion and stenosis of bilateral carotid arteries: Secondary | ICD-10-CM | POA: Diagnosis not present

## 2021-08-02 DIAGNOSIS — Z9049 Acquired absence of other specified parts of digestive tract: Secondary | ICD-10-CM | POA: Diagnosis not present

## 2021-08-02 DIAGNOSIS — R54 Age-related physical debility: Secondary | ICD-10-CM | POA: Diagnosis present

## 2021-08-02 DIAGNOSIS — L8990 Pressure ulcer of unspecified site, unspecified stage: Secondary | ICD-10-CM | POA: Insufficient documentation

## 2021-08-02 DIAGNOSIS — R627 Adult failure to thrive: Secondary | ICD-10-CM | POA: Diagnosis present

## 2021-08-02 LAB — POCT I-STAT 7, (LYTES, BLD GAS, ICA,H+H)
Acid-base deficit: 7 mmol/L — ABNORMAL HIGH (ref 0.0–2.0)
Bicarbonate: 19.8 mmol/L — ABNORMAL LOW (ref 20.0–28.0)
Calcium, Ion: 1.03 mmol/L — ABNORMAL LOW (ref 1.15–1.40)
HCT: 26 % — ABNORMAL LOW (ref 36.0–46.0)
Hemoglobin: 8.8 g/dL — ABNORMAL LOW (ref 12.0–15.0)
O2 Saturation: 96 %
Patient temperature: 98.6
Potassium: 3.3 mmol/L — ABNORMAL LOW (ref 3.5–5.1)
Sodium: 141 mmol/L (ref 135–145)
TCO2: 21 mmol/L — ABNORMAL LOW (ref 22–32)
pCO2 arterial: 47.5 mmHg (ref 32–48)
pH, Arterial: 7.228 — ABNORMAL LOW (ref 7.35–7.45)
pO2, Arterial: 98 mmHg (ref 83–108)

## 2021-08-02 LAB — CBC WITH DIFFERENTIAL/PLATELET
Abs Immature Granulocytes: 0.04 10*3/uL (ref 0.00–0.07)
Basophils Absolute: 0.1 10*3/uL (ref 0.0–0.1)
Basophils Relative: 1 %
Eosinophils Absolute: 0.2 10*3/uL (ref 0.0–0.5)
Eosinophils Relative: 1 %
HCT: 37.1 % (ref 36.0–46.0)
Hemoglobin: 11 g/dL — ABNORMAL LOW (ref 12.0–15.0)
Immature Granulocytes: 0 %
Lymphocytes Relative: 28 %
Lymphs Abs: 2.9 10*3/uL (ref 0.7–4.0)
MCH: 28.9 pg (ref 26.0–34.0)
MCHC: 29.6 g/dL — ABNORMAL LOW (ref 30.0–36.0)
MCV: 97.4 fL (ref 80.0–100.0)
Monocytes Absolute: 0.7 10*3/uL (ref 0.1–1.0)
Monocytes Relative: 7 %
Neutro Abs: 6.6 10*3/uL (ref 1.7–7.7)
Neutrophils Relative %: 63 %
Platelets: 166 10*3/uL (ref 150–400)
RBC: 3.81 MIL/uL — ABNORMAL LOW (ref 3.87–5.11)
RDW: 16.6 % — ABNORMAL HIGH (ref 11.5–15.5)
WBC: 10.4 10*3/uL (ref 4.0–10.5)
nRBC: 0 % (ref 0.0–0.2)

## 2021-08-02 LAB — GLUCOSE, CAPILLARY
Glucose-Capillary: 67 mg/dL — ABNORMAL LOW (ref 70–99)
Glucose-Capillary: 102 mg/dL — ABNORMAL HIGH (ref 70–99)
Glucose-Capillary: 122 mg/dL — ABNORMAL HIGH (ref 70–99)
Glucose-Capillary: 118 mg/dL — ABNORMAL HIGH (ref 70–99)

## 2021-08-02 LAB — COMPREHENSIVE METABOLIC PANEL
ALT: 10 U/L (ref 0–44)
AST: 17 U/L (ref 15–41)
Albumin: 2.2 g/dL — ABNORMAL LOW (ref 3.5–5.0)
Alkaline Phosphatase: 48 U/L (ref 38–126)
Anion gap: 13 (ref 5–15)
BUN: 24 mg/dL — ABNORMAL HIGH (ref 6–20)
CO2: 20 mmol/L — ABNORMAL LOW (ref 22–32)
Calcium: 8 mg/dL — ABNORMAL LOW (ref 8.9–10.3)
Chloride: 107 mmol/L (ref 98–111)
Creatinine, Ser: 1.07 mg/dL — ABNORMAL HIGH (ref 0.44–1.00)
GFR, Estimated: 59 mL/min — ABNORMAL LOW (ref >60)
Glucose, Bld: 68 mg/dL — ABNORMAL LOW (ref 70–99)
Potassium: 4.3 mmol/L (ref 3.5–5.1)
Sodium: 140 mmol/L (ref 135–145)
Total Bilirubin: 0.9 mg/dL (ref 0.3–1.2)
Total Protein: 5.1 g/dL — ABNORMAL LOW (ref 6.5–8.1)

## 2021-08-02 LAB — RESP PANEL BY RT-PCR (FLU A&B, COVID) ARPGX2
Influenza A by PCR: NEGATIVE
Influenza B by PCR: NEGATIVE
SARS Coronavirus 2 by RT PCR: NEGATIVE

## 2021-08-02 LAB — PROTIME-INR
INR: 5.3 (ref 0.8–1.2)
Prothrombin Time: 48.7 seconds — ABNORMAL HIGH (ref 11.4–15.2)

## 2021-08-02 LAB — MRSA NEXT GEN BY PCR, NASAL: MRSA by PCR Next Gen: NOT DETECTED

## 2021-08-02 LAB — APTT: aPTT: 58 seconds — ABNORMAL HIGH (ref 24–36)

## 2021-08-02 LAB — LACTIC ACID, PLASMA: Lactic Acid, Venous: 0.8 mmol/L (ref 0.5–1.9)

## 2021-08-02 MED ORDER — PROPOFOL 1000 MG/100ML IV EMUL
0.0000 ug/kg/min | INTRAVENOUS | Status: DC
  Administered 2021-08-02: 5 ug/kg/min via INTRAVENOUS
  Administered 2021-08-03: 10 ug/kg/min via INTRAVENOUS
  Filled 2021-08-02 (×2): qty 100

## 2021-08-02 MED ORDER — SODIUM CHLORIDE 0.9 % IV SOLN: INTRAVENOUS | Status: DC

## 2021-08-02 MED ORDER — MIDAZOLAM HCL 2 MG/2ML IJ SOLN
INTRAMUSCULAR | Status: AC
  Filled 2021-08-02: qty 2

## 2021-08-02 MED ORDER — DOCUSATE SODIUM 50 MG/5ML PO LIQD
100.0000 mg | Freq: Two times a day (BID) | ORAL | Status: DC
  Administered 2021-08-02 – 2021-08-03 (×2): 100 mg
  Filled 2021-08-02 (×2): qty 10

## 2021-08-02 MED ORDER — SODIUM CHLORIDE 0.9 % IV SOLN: 2.0000 g | INTRAVENOUS | Status: DC

## 2021-08-02 MED ORDER — ACETAMINOPHEN 650 MG RE SUPP
650.0000 mg | Freq: Once | RECTAL | Status: AC
  Administered 2021-08-02: 650 mg via RECTAL
  Filled 2021-08-02: qty 1

## 2021-08-02 MED ORDER — SODIUM CHLORIDE 0.9 % IV SOLN
500.0000 mg | INTRAVENOUS | Status: DC
  Administered 2021-08-02: 500 mg via INTRAVENOUS
  Filled 2021-08-02 (×2): qty 5

## 2021-08-02 MED ORDER — SODIUM CHLORIDE 0.9% FLUSH
3.0000 mL | Freq: Two times a day (BID) | INTRAVENOUS | Status: DC
  Administered 2021-08-03 – 2021-08-14 (×18): 3 mL via INTRAVENOUS

## 2021-08-02 MED ORDER — PROPOFOL 1000 MG/100ML IV EMUL
INTRAVENOUS | Status: AC
  Filled 2021-08-02: qty 100

## 2021-08-02 MED ORDER — VANCOMYCIN HCL 750 MG/150ML IV SOLN
750.0000 mg | INTRAVENOUS | Status: DC
  Filled 2021-08-02: qty 150

## 2021-08-02 MED ORDER — ORAL CARE MOUTH RINSE
15.0000 mL | OROMUCOSAL | Status: DC
  Administered 2021-08-02 – 2021-08-03 (×8): 15 mL via OROMUCOSAL

## 2021-08-02 MED ORDER — SODIUM CHLORIDE 0.9 % IV SOLN
2.0000 g | Freq: Once | INTRAVENOUS | Status: AC
  Administered 2021-08-02: 2 g via INTRAVENOUS
  Filled 2021-08-02: qty 20

## 2021-08-02 MED ORDER — ACETAMINOPHEN 650 MG RE SUPP: 650.0000 mg | Freq: Four times a day (QID) | RECTAL | Status: DC | PRN

## 2021-08-02 MED ORDER — VANCOMYCIN HCL 1750 MG/350ML IV SOLN
1750.0000 mg | Freq: Once | INTRAVENOUS | Status: AC
  Administered 2021-08-02: 1750 mg via INTRAVENOUS
  Filled 2021-08-02: qty 350

## 2021-08-02 MED ORDER — VANCOMYCIN HCL 2000 MG/400ML IV SOLN
2000.0000 mg | Freq: Once | INTRAVENOUS | Status: DC
Start: 2021-08-02 — End: 2021-08-02
  Filled 2021-08-02: qty 400

## 2021-08-02 MED ORDER — VITAMIN K1 10 MG/ML IJ SOLN
1.0000 mg | Freq: Once | INTRAVENOUS | Status: AC
  Administered 2021-08-02: 1 mg via INTRAVENOUS
  Filled 2021-08-02 (×2): qty 0.1

## 2021-08-02 MED ORDER — MIDAZOLAM HCL 5 MG/5ML IJ SOLN
INTRAMUSCULAR | Status: DC | PRN
  Administered 2021-08-02: 2 mg via INTRAVENOUS

## 2021-08-02 MED ORDER — CALCIUM GLUCONATE-NACL 1-0.675 GM/50ML-% IV SOLN
1.0000 g | Freq: Once | INTRAVENOUS | Status: AC
  Administered 2021-08-02: 1000 mg via INTRAVENOUS
  Filled 2021-08-02: qty 50

## 2021-08-02 MED ORDER — HYDROCORTISONE SOD SUC (PF) 100 MG IJ SOLR
100.0000 mg | Freq: Two times a day (BID) | INTRAMUSCULAR | Status: DC
  Administered 2021-08-02 – 2021-08-04 (×4): 100 mg via INTRAVENOUS
  Filled 2021-08-02 (×4): qty 2

## 2021-08-02 MED ORDER — FENTANYL CITRATE PF 50 MCG/ML IJ SOSY
50.0000 ug | PREFILLED_SYRINGE | INTRAMUSCULAR | Status: DC | PRN
  Administered 2021-08-02: 100 ug via INTRAVENOUS
  Filled 2021-08-02: qty 2

## 2021-08-02 MED ORDER — PIPERACILLIN-TAZOBACTAM 3.375 G IVPB 30 MIN
3.3750 g | Freq: Once | INTRAVENOUS | Status: AC
  Administered 2021-08-02: 3.375 g via INTRAVENOUS
  Filled 2021-08-02: qty 50

## 2021-08-02 MED ORDER — ACETAMINOPHEN 325 MG PO TABS: 650.0000 mg | ORAL_TABLET | Freq: Four times a day (QID) | ORAL | Status: DC | PRN

## 2021-08-02 MED ORDER — SODIUM CHLORIDE 0.9 % IV SOLN
250.0000 mL | INTRAVENOUS | Status: DC
Start: 2021-08-02 — End: 2021-08-15
  Administered 2021-08-03 – 2021-08-13 (×2): 250 mL via INTRAVENOUS

## 2021-08-02 MED ORDER — POLYETHYLENE GLYCOL 3350 17 G PO PACK
17.0000 g | PACK | Freq: Every day | ORAL | Status: DC
  Administered 2021-08-03: 17 g
  Filled 2021-08-02: qty 1

## 2021-08-02 MED ORDER — PROPOFOL 1000 MG/100ML IV EMUL: 0.0000 ug/kg/min | INTRAVENOUS | Status: DC

## 2021-08-02 MED ORDER — MIDAZOLAM HCL 2 MG/2ML IJ SOLN: 2.0000 mg | INTRAMUSCULAR | Status: DC | PRN

## 2021-08-02 MED ORDER — LEVOTHYROXINE SODIUM 75 MCG PO TABS
150.0000 ug | ORAL_TABLET | Freq: Every day | ORAL | Status: DC
  Administered 2021-08-03: 150 ug
  Filled 2021-08-02: qty 2

## 2021-08-02 MED ORDER — FENTANYL CITRATE (PF) 100 MCG/2ML IJ SOLN
INTRAMUSCULAR | Status: DC | PRN
Start: 2021-08-02 — End: 2021-08-02
  Administered 2021-08-02: 50 ug via INTRAVENOUS

## 2021-08-02 MED ORDER — PIPERACILLIN-TAZOBACTAM 3.375 G IVPB
3.3750 g | Freq: Three times a day (TID) | INTRAVENOUS | Status: AC
  Administered 2021-08-02 – 2021-08-08 (×19): 3.375 g via INTRAVENOUS
  Filled 2021-08-02 (×19): qty 50

## 2021-08-02 MED ORDER — FENTANYL CITRATE PF 50 MCG/ML IJ SOSY
PREFILLED_SYRINGE | INTRAMUSCULAR | Status: AC
  Filled 2021-08-02: qty 2

## 2021-08-02 MED ORDER — LACTATED RINGERS IV SOLN: INTRAVENOUS | Status: DC

## 2021-08-02 MED ORDER — CHLORHEXIDINE GLUCONATE CLOTH 2 % EX PADS
6.0000 | MEDICATED_PAD | Freq: Every day | CUTANEOUS | Status: DC
  Administered 2021-08-02 – 2021-08-14 (×13): 6 via TOPICAL

## 2021-08-02 MED ORDER — HYDROCORTISONE SOD SUC (PF) 100 MG IJ SOLR: 100.0000 mg | Freq: Two times a day (BID) | INTRAMUSCULAR | Status: DC

## 2021-08-02 MED ORDER — ROCURONIUM BROMIDE 50 MG/5ML IV SOLN
INTRAVENOUS | Status: DC | PRN
  Administered 2021-08-02: 70 mg via INTRAVENOUS

## 2021-08-02 MED ORDER — NOREPINEPHRINE 4 MG/250ML-% IV SOLN
2.0000 ug/min | INTRAVENOUS | Status: DC
  Administered 2021-08-02: 2 ug/min via INTRAVENOUS
  Administered 2021-08-03: 6 ug/min via INTRAVENOUS
  Administered 2021-08-04: 2 ug/min via INTRAVENOUS
  Filled 2021-08-02 (×3): qty 250

## 2021-08-02 MED ORDER — INSULIN ASPART 100 UNIT/ML IJ SOLN
0.0000 [IU] | INTRAMUSCULAR | Status: DC
  Administered 2021-08-02 – 2021-08-03 (×2): 1 [IU] via SUBCUTANEOUS
  Administered 2021-08-03 (×3): 2 [IU] via SUBCUTANEOUS
  Administered 2021-08-04: 1 [IU] via SUBCUTANEOUS
  Administered 2021-08-05: 2 [IU] via SUBCUTANEOUS
  Administered 2021-08-05 (×2): 1 [IU] via SUBCUTANEOUS
  Administered 2021-08-06 (×3): 2 [IU] via SUBCUTANEOUS
  Administered 2021-08-06 (×3): 3 [IU] via SUBCUTANEOUS
  Administered 2021-08-07: 5 [IU] via SUBCUTANEOUS
  Administered 2021-08-07 – 2021-08-08 (×5): 3 [IU] via SUBCUTANEOUS
  Administered 2021-08-08: 5 [IU] via SUBCUTANEOUS
  Administered 2021-08-08: 3 [IU] via SUBCUTANEOUS
  Administered 2021-08-08: 5 [IU] via SUBCUTANEOUS
  Administered 2021-08-08: 3 [IU] via SUBCUTANEOUS
  Administered 2021-08-08: 5 [IU] via SUBCUTANEOUS
  Administered 2021-08-09: 2 [IU] via SUBCUTANEOUS
  Administered 2021-08-09: 7 [IU] via SUBCUTANEOUS
  Administered 2021-08-09: 5 [IU] via SUBCUTANEOUS
  Administered 2021-08-09 (×4): 3 [IU] via SUBCUTANEOUS
  Administered 2021-08-10: 7 [IU] via SUBCUTANEOUS
  Administered 2021-08-10: 3 [IU] via SUBCUTANEOUS
  Administered 2021-08-10: 2 [IU] via SUBCUTANEOUS
  Administered 2021-08-10: 3 [IU] via SUBCUTANEOUS
  Administered 2021-08-10 – 2021-08-11 (×2): 7 [IU] via SUBCUTANEOUS
  Administered 2021-08-11: 3 [IU] via SUBCUTANEOUS
  Administered 2021-08-11: 2 [IU] via SUBCUTANEOUS
  Administered 2021-08-11 (×2): 3 [IU] via SUBCUTANEOUS
  Administered 2021-08-11: 2 [IU] via SUBCUTANEOUS
  Administered 2021-08-12: 1 [IU] via SUBCUTANEOUS
  Administered 2021-08-12 (×3): 2 [IU] via SUBCUTANEOUS

## 2021-08-02 MED ORDER — PANTOPRAZOLE 2 MG/ML SUSPENSION
40.0000 mg | Freq: Every day | ORAL | Status: DC
  Administered 2021-08-02 – 2021-08-03 (×2): 40 mg
  Filled 2021-08-02 (×3): qty 20

## 2021-08-02 MED ORDER — DEXTROSE 50 % IV SOLN
INTRAVENOUS | Status: AC
  Filled 2021-08-02: qty 50

## 2021-08-02 MED ORDER — CHLORHEXIDINE GLUCONATE 0.12% ORAL RINSE (MEDLINE KIT)
15.0000 mL | Freq: Two times a day (BID) | OROMUCOSAL | Status: DC
  Administered 2021-08-02 – 2021-08-03 (×2): 15 mL via OROMUCOSAL

## 2021-08-02 MED ORDER — ACETAMINOPHEN 325 MG PO TABS
650.0000 mg | ORAL_TABLET | Freq: Four times a day (QID) | ORAL | Status: DC | PRN
Start: 2021-08-02 — End: 2021-08-03

## 2021-08-02 MED ORDER — HYDRALAZINE HCL 20 MG/ML IJ SOLN: 5.0000 mg | INTRAMUSCULAR | Status: DC | PRN

## 2021-08-02 MED ORDER — FENTANYL CITRATE (PF) 100 MCG/2ML IJ SOLN
50.0000 ug | INTRAMUSCULAR | Status: DC | PRN
  Administered 2021-08-02: 100 ug via INTRAVENOUS
  Filled 2021-08-02: qty 2

## 2021-08-02 MED ORDER — ETOMIDATE 2 MG/ML IV SOLN
INTRAVENOUS | Status: DC | PRN
  Administered 2021-08-02: 10 mg via INTRAVENOUS

## 2021-08-02 MED ORDER — LACTATED RINGERS IV BOLUS
1000.0000 mL | Freq: Once | INTRAVENOUS | Status: AC
  Administered 2021-08-02: 1000 mL via INTRAVENOUS

## 2021-08-02 NOTE — Sepsis Progress Note (Signed)
Elink following code sepsis °

## 2021-08-02 NOTE — Assessment & Plan Note (Signed)
-  INR was reversed due to this complicating factor should she become more anemic -Hgb is currently stable

## 2021-08-02 NOTE — Assessment & Plan Note (Signed)
-  Chronic R-sided weakness, aphasia -Appears to be at high risk for aspiration currently -Likely needs swallow evaluation prior to any PO intake

## 2021-08-02 NOTE — Progress Notes (Signed)
Patient intubated by ICU MD without complications.  Positive color change noted.  Bilateral breath sounds auscultated.  Sats and vitals are stable.  Chest xray pending for tube confirmation.  Will continue to monitor.

## 2021-08-02 NOTE — H&P (Signed)
NAME:  Connie Ruiz, MRN:  [id number], DOB:  22-May-1961, LOS: 0 ADMISSION DATE:  08/02/2021, CONSULTATION DATE:  08/02/2021 REFERRING MD:  Dr. Lorin Mercy, [facility], CHIEF COMPLAINT:  Gurgling respirations   History of Present Illness:  61 yo female from [facility] with fever 101.3.  SpO2 in 70s on room air.  Noted to have gurgling respirations and chest xray showed findings of aspiration pneumonia.  She has prior CVA and is non verbal.   She was not able to protect airway and required intubation.  She has hx of renal disease and previously on dialysis for 5 years.  She failed access multiple times and was transitioned to peritoneal dialysis.  She had renal transplant in November 2005.  [facility]  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  Objective   Blood pressure 108/69, pulse (!) 120, temperature (!) 104.1 F (40.1 C), temperature source Rectal, resp. rate (!) 25, height 5' (1.524 m), SpO2 100 %.    Vent Mode: PRVC FiO2 (%):  [100 %] 100 % Set Rate:  [24 bmp] 24 bmp Vt Set:  [360 mL] 360 mL PEEP:  [8 cmH20] 8 cmH20 Plateau Pressure:  [21 cmH20] 21 cmH20   Intake/Output Summary (Last 24 hours) at 08/02/2021 1350 Last data filed at 08/02/2021 0867 Gross per 24 hour  Intake 250 ml  Output --  Net 250 ml   There were no vitals filed for this visit.  Examination:  General - somnolent Eyes - pupils reactive ENT - pooling secretions, gurgling, poor dentition, scar over lower anterior  neck Cardiac - regular, tachycardic Chest - b/l crackles Abdomen - soft, non tender, decreased bowel sounds Extremities - decreased muscle bulk Skin - changes of intertrigo in groin area b/l Neuro - opens eyes with stimulation, non verbal, not following commands  Resolved Hospital Problem list     Assessment & Plan:   Acute hypoxic respiratory failure with compromised airway in setting of aspiration pneumonia. - intubated in ER - goal SpO2 > 92% - f/u CXR, ABG  Aspiration pneumonia. - hx of chronic immunosuppression - change ABx to vancomycin and zosyn - f/u blood and sputum culture  Hypotension after intubation. - likely from hypovolemia - continue IV fluid  Difficult IV access. - Lt IJ and [place] CVL attempted, but unsuccessful - will have PICC line placed if we can convince IV team to do it  Hx of SLE s/p renal transplant. - followed by nephrology at [facility] in place of prednisone for now - hold outpt prograf  Hx of PE on coumadin with elevated  INR. - hold coumadin - received Vit K on 2/26 - f/u INR  Acute metabolic encephalopathy from hypoxia. Hx of CVA with aphasia and Rt side weakness. - RASS goal 0 to -1  Best Practice (right click and "Reselect all SmartList Selections" daily)   Diet/type: NPO DVT prophylaxis: SCD GI prophylaxis: PPI Lines: N/A Foley:  Yes, and it is still needed Code Status:  full code Last date of multidisciplinary goals of care discussion [  x]  Labs   CBC: Recent Labs  Lab 08/02/21 0511  WBC 10.4  NEUTROABS 6.6  HGB 11.0*  HCT 37.1  MCV 97.4  PLT 893    Basic Metabolic Panel: Recent Labs  Lab 08/02/21 0511  NA 140  K 4.3  CL 107  CO2 20*  GLUCOSE 68*  BUN 24*  CREATININE 1.07*  CALCIUM 8.0*   GFR: CrCl cannot be calculated (Unknown ideal weight.). Recent Labs  Lab 08/02/21 0511  WBC 10.4  LATICACIDVEN 0.8    Liver Function Tests: Recent Labs  Lab 08/02/21 0511  AST 17  ALT 10  ALKPHOS  48  BILITOT 0.9  PROT 5.1*  ALBUMIN 2.2*   No results for input(s): LIPASE, AMYLASE in the last 168 hours. No results for input(s): AMMONIA in the last 168 hours.  ABG    Component Value Date/Time   TCO2 30 05/26/2021 1840     Coagulation Profile: Recent Labs  Lab 08/02/21 0511  INR 5.3*    Cardiac Enzymes: No results for input(s): CKTOTAL, CKMB, CKMBINDEX, TROPONINI in the last 168 hours.  HbA1C: Hgb A1c MFr Bld  Date/Time Value Ref Range Status  05/27/2021 06:20 AM 6.0 (H) 4.8 - 5.6 % Final    Comment:    (NOTE) Pre diabetes:          5.7%-6.4%  Diabetes:              >6.4%  Glycemic control for   <7.0% adults with diabetes   01/27/2021 10:09 AM 8.2 (H) 4.8 - 5.6 % Final    Comment:    (NOTE) Pre diabetes:          5.7%-6.4%  Diabetes:              >6.4%  Glycemic control for   <7.0% adults with diabetes     CBG: No results for input(s): GLUCAP in the last 168 hours.  Review of Systems:   Unable to obtain  Past Medical History:  She,  has a past medical history of Anxiety, Candida esophagitis ([place]) (11/12/2014), CEREBROVASCULAR ACCIDENT, ACUTE (04/15/2010), CLOSTRIDIUM DIFFICILE COLITIS, HX OF (08/21/2007), CONGESTIVE HEART FAILURE (08/21/2007), Current use of long term anticoagulation, CVA (04/17/2010), Depression, DIABETES MELLITUS, TYPE II (08/21/2007), DVT, HX OF (08/21/2007), GERD (08/21/2007), GOUT (08/21/2007), History of stroke with residual effects, HYPERLIPIDEMIA (08/21/2007), HYPERTENSION (08/21/2007), KIDNEY TRANSPLANTATION, HX OF (08/22/2007), LUPUS (08/21/2007), OSTEOPOROSIS (08/21/2007), Pulmonary embolism ([place]) (07/16/2010), Renal failure, RENAL INSUFFICIENCY (08/21/2007), Right sided weakness, Steroid-induced hyperglycemia (11/09/2014), Tachycardia, and THYROID NODULE, 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   Surgical History:   Past Surgical History:  Procedure Laterality Date   BIOPSY  02/04/2021   Procedure: BIOPSY;  Surgeon: [name], MD;  Location: [facility] END[facility];   Service: Endoscopy;;   CESAREAN SECTION     CHOLECYSTECTOMY     COLONOSCOPY  06/2000   [facility]    ENTEROSCOPY N/A 11/11/2014   Procedure: ENTEROSCOPY;  Surgeon: Ladene Artist, MD;  Location: [facility] ENDOSCOPY;  Service: Endoscopy;  Laterality: N/A;   FLEXIBLE SIGMOIDOSCOPY N/A 02/04/2021   Procedure: FLEXIBLE SIGMOIDOSCOPY;  Surgeon: [name], MD;  Location: [facility] ENDOSCOPY;  Service: Endoscopy;  Laterality: N/A;   KIDNEY TRANSPLANT Right 2009   RENAL BIOPSY, OPEN  1981   TUBAL LIGATION       Social History:   reports that she has never smoked. She has never used smokeless tobacco. She reports that she does not drink alcohol and does not use drugs.   Family History:  Her family  history includes Asthma in her daughter and sister; Cancer in her maternal grandfather and paternal grandfather; Heart attack in her mother; Heart disease in her father.   Allergies Allergies  Allergen Reactions   Oxycodone-Acetaminophen Shortness Of Breath and Nausea Only   Propoxyphene Nausea Only and Shortness Of Breath    States takes tylenol at home   Sulfonamide Derivatives Shortness Of Breath and Nausea Only   Codeine Nausea Only   Hydrocodone-Acetaminophen     unknown   Hydromorphone     On MAR   Other Other (See Comments)    No blood, Jehovaeh Witness    Sulfamethoxazole    Tape     Redness**PAPER TAPE OK**   Gabapentin Anxiety    twitching   Latex Rash   Metoprolol Rash   Morphine And Related Rash    IV site on arm is red, patient reports this is improving.  NO shortness of breath reported. Patient is given dose of Benadryl when taking Morphine    Rosiglitazone Rash     Home Medications  Prior to Admission medications   Medication Sig Start Date End Date Taking? Authorizing Provider  acetaminophen (TYLENOL) 500 MG tablet Take 1,000 mg by mouth every 6 (six) hours as needed for moderate pain.   Yes [provider]  alendronate (FOSAMAX) 70 MG tablet Take 70 mg by mouth every  Saturday.   Yes [provider]  Amino Acids-Protein Hydrolys (FEEDING SUPPLEMENT, PRO-STAT SUGAR FREE 64,) LIQD Take 30 mLs by mouth in the morning and at bedtime.   Yes [provider]  amitriptyline (ELAVIL) 10 MG tablet Take 10 mg by mouth at bedtime.   Yes [provider]  calcitRIOL (ROCALTROL) 0.25 MCG capsule TAKE 1 CAPSULE(0.25 MCG) BY MOUTH DAILY. NO REFILLS WITHOUT APPOINTMENT Patient taking differently: Take 0.25 mcg by mouth daily. 02/13/19  Yes Renato Shin, MD  carvedilol (COREG) 6.25 MG tablet Take 1 tablet (6.25 mg total) by mouth 2 (two) times daily with a meal. 02/13/21  Yes Little Ishikawa, MD  cholecalciferol (VITAMIN D3) 25 MCG (1000 UNIT) tablet Take 1,000 Units by mouth daily.   Yes [provider]  cholestyramine (QUESTRAN) 4 g packet Take 1 packet (4 g total) by mouth daily. 05/29/21 09/12/21 Yes Amin, Jeanella Flattery, MD  dicyclomine (BENTYL) 10 MG capsule Take 1 capsule (10 mg total) by mouth 3 (three) times daily before meals. 02/13/21  Yes Little Ishikawa, MD  diltiazem [facility]) 360 MG 24 hr capsule Take 1 capsule (360 mg total) by mouth daily. 06/27/19  Yes [name], [name], MD  donepezil (ARICEPT) 10 MG tablet Take 10 mg by mouth at bedtime.  11/18/16  Yes [provider]  esomeprazole (NEXIUM) 20 MG capsule TAKE 1 CAPSULE(20 MG) BY MOUTH DAILY Patient taking differently: Take 20 mg by mouth daily at 12 noon. 07/17/18  Yes Renato Shin, MD  famotidine (PEPCID) 20 MG tablet Take 20 mg by mouth 2 (two) times daily.   Yes [provider]  FLUoxetine (PROZAC) 20 MG capsule Take 20 mg by mouth daily.   Yes [provider]  furosemide (LASIX) 20 MG tablet Take 20 mg by mouth daily.   Yes [provider]  hydrocortisone (ANUSOL-HC) 25 MG suppository Place 1 suppository (25 mg total) rectally at bedtime. 05/29/21  Yes Amin, Ankit Chirag, MD  insulin lispro (HUMALOG) 100 UNIT/ML injection Inject 0-12 Units  into the skin 3 (three) times daily before meals. Per Sliding Scale. If blood sugar  is: 0-150: 0 units 151-200: 2 units 201-250: 4 units 251-300: 6 units 301-350: 8 units 351-400: 10 units 401-450: 12 units   Yes [provider]  lactose free nutrition (BOOST) LIQD Take 237 mLs by mouth in the morning and at bedtime.   Yes [provider]  levothyroxine (SYNTHROID) 150 MCG tablet Take 1 tablet (150 mcg total) by mouth daily at 6 (six) AM. Patient taking differently: Take 150 mcg by mouth daily before breakfast. 02/14/21  Yes Little Ishikawa, MD  loperamide (IMODIUM) 2 MG capsule Take 2 mg by mouth every 6 (six) hours as needed for diarrhea or loose stools.   Yes [provider]  magnesium chloride (SLOW-MAG) 64 MG TBEC SR tablet Take 1 tablet by mouth daily.   Yes [provider]  mirtazapine (REMERON) 30 MG tablet Take 1 tablet (30 mg total) by mouth at bedtime. 07/28/15  Yes Jaffe, Adam R, DO  morphine (MSIR) 15 MG tablet Take 15 mg by mouth every 4 (four) hours as needed (pain).   Yes [provider]  Multiple Vitamin (MULTIVITAMIN) tablet Take 1 tablet by mouth daily.   Yes [provider]  ondansetron (ZOFRAN ODT) 4 MG disintegrating tablet 4mg  ODT q4 hours prn nausea/vomiting Patient taking differently: Take 4 mg by mouth every 4 (four) hours as needed for nausea or vomiting. 06/15/15  Yes Mesner, Corene Cornea, MD  potassium chloride SA (KLOR-CON M) 20 MEQ tablet Take 20 mEq by mouth daily.   Yes [provider]  predniSONE (DELTASONE) 5 MG tablet Take 1 tablet (5 mg total) by mouth daily with breakfast. Patient taking differently: Take 5 mg by mouth daily. Continuous 05/29/21  Yes Amin, Ankit Chirag, MD  PSYLLIUM PO Take 11.8 g by mouth daily. Reguloid 3g/5.4g   Yes [provider]  Skin Protectants, Misc. (EUCERIN) cream Apply 1 application topically every evening. To feet   Yes [provider]  tacrolimus  (PROGRAF) 0.5 MG capsule Take 0.5 mg by mouth See admin instructions. 0.5mg  every 12 hours along with Tacrolimus 2mg  dose for a total dose of 2.5mg  every 12 hours. 04/17/21  Yes [provider]  tacrolimus (PROGRAF) 1 MG capsule Take 2 mg by mouth See admin instructions. 2mg  every 12 hours along with Tacrolimus 0.5mg  dose for a total dose of 2.5mg  every 12 hours. 01/15/18  Yes [provider]  nystatin (MYCOSTATIN/NYSTOP) powder Apply topically 2 (two) times daily. Patient not taking: Reported on 08/02/2021 05/29/21   Damita Lack, MD     Critical care time: 48 minutes independent of procedure time  Chesley Mires, MD [place] Pager - [phone] 08/02/2021, 2:16 PM

## 2021-08-02 NOTE — ED Notes (Signed)
Dr. Llana Aliment remains at bedside attempting to place central line. Delay in transport.

## 2021-08-02 NOTE — Progress Notes (Signed)
[facility] Progress Note Patient Name: Connie WADAS DOB: [birth year] MRN: [id number]   Date of Service  08/02/2021  HPI/Events of Note  Hypocalcemia - Ionized Ca++ = 1.03.  eICU Interventions  Will replace Ca++.      Intervention Category Major Interventions: Electrolyte abnormality - evaluation and management  Leighann Amadon Cornelia Copa 08/02/2021, 10:35 PM

## 2021-08-02 NOTE — Progress Notes (Signed)
An USGPIV (ultrasound guided PIV) has been placed for short-term vasopressor infusion. A correctly placed ivWatch must be used when administering Vasopressors. Should this treatment be needed beyond 72 hours, central line access should be obtained.  It will be the responsibility of the bedside nurse to follow best practice to prevent extravasations.   ?

## 2021-08-02 NOTE — Assessment & Plan Note (Signed)
-  Patient presented overnight with sepsis due to PNA -At the time of my evaluation, she had marked secretions and did not appear to be protecting her airway well -Her O2 sats dropped as low as 79% with good pleth -I consulted PCCM and she has since been intubated and will be on their service -I attempted to call her daughter without success

## 2021-08-02 NOTE — ED Notes (Signed)
Dr. Halford Chessman at bedside. Attempting to place central line. Delay to transport to ICU bed.

## 2021-08-02 NOTE — Progress Notes (Signed)
Pharmacy Antibiotic Note  Connie Ruiz is a 61 y.o. female admitted on 08/02/2021 with pneumonia.  Pharmacy has been consulted for vancomycin and zosyn dosing.  Patient with a history of HF; DM; HTN; HLD; CVA with R-sided weakness and chronic cognitive impairment (mostly nonverbal); renal transplant; SLE; and PE on Coumadin. Patient presenting with AMS.  SCr 1.07 WBC 10.4; LA 0.8; T 104.1 F; HR 115; RR 24 Intubated Resp panel pending MRSA PCR ordered  Plan: Zosyn 3.375g IV q8h (4 hour infusion) Vancomycin 1750 mg once then 750 mg q24hr (eAUC 489) unless change in renal function Trend WBC, Fever, Renal function, & Clinical course F/u cultures, clinical course, WBC, fever De-escalate when able Levels at steady state F/u MRSA PCR  Height: 5' (152.4 cm) IBW/kg (Calculated) : 45.5  Temp (24hrs), Avg:104.1 F (40.1 C), Min:104.1 F (40.1 C), Max:104.1 F (40.1 C)  Recent Labs  Lab 08/02/21 0511  WBC 10.4  CREATININE 1.07*  LATICACIDVEN 0.8    CrCl cannot be calculated (Unknown ideal weight.).    Allergies  Allergen Reactions   Oxycodone-Acetaminophen Shortness Of Breath and Nausea Only   Propoxyphene Nausea Only and Shortness Of Breath    States takes tylenol at home   Sulfonamide Derivatives Shortness Of Breath and Nausea Only   Codeine Nausea Only   Hydrocodone-Acetaminophen     unknown   Hydromorphone     On MAR   Other Other (See Comments)    No blood, Jehovaeh Witness    Sulfamethoxazole    Tape     Redness**PAPER TAPE OK**   Gabapentin Anxiety    twitching   Latex Rash   Metoprolol Rash   Morphine And Related Rash    IV site on arm is red, patient reports this is improving.  NO shortness of breath reported. Patient is given dose of Benadryl when taking Morphine    Rosiglitazone Rash    Antimicrobials this admission: Started on Rocephin/Azith in ED. Vancomycin 2/26 >>  Zosyn 2/26 >>   Microbiology results: Pending  Thank you for allowing pharmacy  to be a part of this patients care.  Lorelei Pont, PharmD, BCPS 08/02/2021 12:53 PM ED Clinical Pharmacist -  [phone]

## 2021-08-02 NOTE — Progress Notes (Addendum)
Secure chat with Dr Halford Chessman re PICC order.  Dr Halford Chessman to contact nephrology for approval for PICC placement per policy. Pt hx of renal transplant and most recently seen by [facility] nephrology on 07-17-21.  Pt currently has 2 PIV's.

## 2021-08-02 NOTE — Progress Notes (Signed)
Peripherally Inserted Central Catheter Placement  The IV Nurse has discussed with the patient and/or persons authorized to consent for the patient, the purpose of this procedure and the potential benefits and risks involved with this procedure.  The benefits include less needle sticks, lab draws from the catheter, and the patient may be discharged home with the catheter. Risks include, but not limited to, infection, bleeding, blood clot (thrombus formation), and puncture of an artery; nerve damage and irregular heartbeat and possibility to perform a PICC exchange if needed/ordered by physician.  Alternatives to this procedure were also discussed.  [facility] PICC patient education guide, fact sheet on infection prevention and patient information card has been provided to patient /or left at bedside.  Consent signed by daughter due to altered mental status.  PICC attempted by Jake Samples, RN in left brachial vein after approval given by Dr. Jonnie Finner with nephrology.  Vein cannulated with good blood return on first attempt.  Guidewire was initially resistant to thread but then threaded with ease.  PICC unable to be threaded centrally despite attempts.  Nate, RN notified.          Connie Ruiz, Connie Ruiz 08/02/2021, 5:24 PM

## 2021-08-02 NOTE — ED Provider Notes (Signed)
[place] EMERGENCY DEPARTMENT Provider Note   CSN: [id number] Arrival date & time: 08/02/21  0435     History  Chief Complaint  Patient presents with   Altered Mental Status    Connie Ruiz is a 61 y.o. female.  61 year old female who is unable to provide any history at this time presents via EMS from [facility].  History obtained from EMS, [employer] and her daughter.  Sounds like the patient was not acting normally so they checked her [facility] and it was low so they started on oxygen and did not any better so they sent her here for further evaluation.  With EMS she had a temperature of 101.3.  They gave her some fluids.  Reportedly was nonverbal at baseline however daughter states that she is verbal it just takes her a while to express what she wants to say and she is not always very quick with it.  She has a history of a stroke.  She has a history of ulcers but none of those were active and seem to be improving this time.  Daughter states that as far she knows she was normal up until this evening.  She was actually planning to come visit her today the last time she saw her was a couple days ago.  No known trauma.  No known sick contacts.   Altered Mental Status     Home Medications Prior to Admission medications   Medication Sig Start Date End Date Taking? Authorizing Provider  acetaminophen (TYLENOL) 500 MG tablet Take 1,000 mg by mouth every 6 (six) hours as needed for moderate pain.    [provider]  alendronate (FOSAMAX) 70 MG tablet Take 70 mg by mouth once a week. Take with a full glass of water on an empty stomach on Saturdays.    [provider]  Blood Glucose Monitoring Suppl (ACCU-CHEK AVIVA PLUS) w/Device KIT 1 Device by Does not apply route daily. 10/13/18   [name], [name], MD  calcitRIOL (ROCALTROL) 0.25 MCG capsule TAKE 1 CAPSULE(0.25 MCG) BY MOUTH DAILY. NO REFILLS WITHOUT APPOINTMENT Patient taking  differently: Take 0.25 mcg by mouth daily. 02/13/19   Renato Shin, MD  carvedilol (COREG) 6.25 MG tablet Take 1 tablet (6.25 mg total) by mouth 2 (two) times daily with a meal. 02/13/21   Little Ishikawa, MD  cholecalciferol (VITAMIN D3) 25 MCG (1000 UNIT) tablet Take 1,000 Units by mouth daily.    [provider]  cholestyramine (QUESTRAN) 4 g packet Take 1 packet (4 g total) by mouth daily. 05/29/21 06/28/21  Amin, Jeanella Flattery, MD  dicyclomine (BENTYL) 10 MG capsule Take 1 capsule (10 mg total) by mouth 3 (three) times daily before meals. 02/13/21   Little Ishikawa, MD  diltiazem [facility]) 360 MG 24 hr capsule Take 1 capsule (360 mg total) by mouth daily. 06/27/19   [name], [name], MD  donepezil (ARICEPT) 10 MG tablet Take 10 mg by mouth at bedtime.  11/18/16   [provider]  esomeprazole (NEXIUM) 20 MG capsule TAKE 1 CAPSULE(20 MG) BY MOUTH DAILY Patient taking differently: Take 20 mg by mouth daily at 12 noon. 07/17/18   Renato Shin, MD  famotidine (PEPCID) 20 MG tablet Take 20 mg by mouth 2 (two) times daily.    [provider]  furosemide (LASIX) 20 MG tablet Take 20 mg by mouth daily.    [provider]  glucose blood (ONE TOUCH ULTRA TEST)  test strip Use to check blood sugar 1 time per day 02/12/15   Renato Shin, MD  hydrocortisone (ANUSOL-HC) 25 MG suppository Place 1 suppository (25 mg total) rectally at bedtime. 05/29/21   Amin, Jeanella Flattery, MD  insulin lispro (HUMALOG) 100 UNIT/ML injection Inject 0-12 Units into the skin 3 (three) times daily before meals. Per Sliding Scale. If blood sugar is: 0-150: 0 units 151-200: 2 units 201-250: 4 units 251-300: 6 units 301-350: 8 units 351-400: 10 units 401-450: 12 units    [provider]  levothyroxine (SYNTHROID) 150 MCG tablet Take 1 tablet (150 mcg total) by mouth daily at 6 (six) AM. 02/14/21   Little Ishikawa, MD  loperamide (IMODIUM) 2 MG capsule Take 2 mg by mouth every 6 (six)  hours as needed for diarrhea or loose stools.    [provider]  magnesium oxide (MAG-OX) 400 MG tablet Take 800 mg by mouth daily.  01/11/17   [provider]  mirtazapine (REMERON) 30 MG tablet Take 1 tablet (30 mg total) by mouth at bedtime. 07/28/15   Pieter Partridge, DO  nystatin (MYCOSTATIN/NYSTOP) powder Apply topically 2 (two) times daily. 05/29/21   Amin, Jeanella Flattery, MD  ondansetron (ZOFRAN ODT) 4 MG disintegrating tablet 23m ODT q4 hours prn nausea/vomiting Patient taking differently: Take 4 mg by mouth every 4 (four) hours as needed for nausea or vomiting. 06/15/15   Tricia Oaxaca, JCorene Cornea MD  [facility] LANCETS 367HMISC Use to check blood sugar 1 time per day 02/12/15   ERenato Shin MD  predniSONE (DELTASONE) 5 MG tablet Take 1 tablet (5 mg total) by mouth daily with breakfast. 05/29/21   Amin, AJeanella Flattery MD  PSYLLIUM PO Take 1 packet by mouth daily. 3g/5.4g: 2tsp -mix 1sp in 8S.N.P.J.of water qd    [provider]  sertraline (ZOLOFT) 50 MG tablet Take 50 mg by mouth daily. 05/12/21   [provider]  Skin Protectants, Misc. (EUCERIN) cream Apply 1 application topically daily. To feet    [provider]  sucralfate (CARAFATE) 1 g tablet Take 1 tablet (1 g total) by mouth 4 (four) times daily for 30 days. 08/22/18 05/26/21  Pokhrel, LCorrie Mckusick MD  tacrolimus (PROGRAF) 0.5 MG capsule Take 0.5 mg by mouth See admin instructions. Takes 0.540mcap with 2 68m45macrolimus to equal a total 2.5mg59mery 12 hours 04/17/21   [provider]  tacrolimus (PROGRAF) 1 MG capsule Take 2 mg by mouth See admin instructions. Takes 2 68mg 12msules with Tacrolimus 0.5mg c35mules to = 2.5mg ev55m 12 hours 01/15/18   [provider]  warfarin (COUMADIN) 1 MG tablet Take 0.5 mg by mouth See admin instructions. Takes 1/2 tab with 3mg tab77m equal a total of 3.5mg ever72mther day. Patient not taking: Reported on 05/26/2021    [provider]  warfarin  (COUMADIN) 3 MG tablet Take 3 mg by mouth See admin instructions. Takes 1 3mg tab w29m 1/2 of a 68mg tab to26mual a total of 3.5mg every o25mr day Patient not taking: Reported on 05/26/2021    [provider]  warfarin (COUMADIN) 4 MG tablet Take by mouth. Patient not taking: Reported on 05/26/2021 05/18/21   [provider]      Allergies    Oxycodone-acetaminophen, Propoxyphene, Propoxyphene n-acetaminophen, Sulfonamide derivatives, Codeine, Hydrocodone-acetaminophen, Hydromorphone, Other, Oxycodone, Sulfamethoxazole, Tape, Gabapentin, Latex, Metoprolol, Morphine, Morphine and related, and Rosiglitazone    Review of Systems   Review of Systems  Physical Exam  Updated Vital Signs Pulse (!) 121    Resp 14  Physical Exam Vitals and nursing note reviewed.  HENT:     Nose: Congestion present. No rhinorrhea.     Mouth/Throat:     Mouth: Mucous membranes are dry.  Cardiovascular:     Rate and Rhythm: Tachycardia present.  Pulmonary:     Breath sounds: Rales present.  Abdominal:     General: Abdomen is flat. There is no distension.  Musculoskeletal:        General: No swelling or tenderness. Normal range of motion.  Skin:    General: Skin is warm and dry.     Findings: No rash.     Comments: Has a wound on her right elbow that is covered with a bandage without any obvious infection  Neurological:     Motor: Weakness present.    ED Results / Procedures / Treatments   Labs (all labs ordered are listed, but only abnormal results are displayed) Labs Reviewed  CULTURE, BLOOD (ROUTINE X 2)  CULTURE, BLOOD (ROUTINE X 2)  URINE CULTURE  LACTIC ACID, PLASMA  LACTIC ACID, PLASMA  COMPREHENSIVE METABOLIC PANEL  CBC WITH DIFFERENTIAL/PLATELET  PROTIME-INR  APTT  URINALYSIS, ROUTINE W REFLEX MICROSCOPIC    EKG None  Radiology No results found.  Procedures .Critical Care Performed by: Merrily Pew, MD Authorized by: Merrily Pew, MD   Critical care  provider statement:    Critical care time (minutes):  32   Critical care time was exclusive of:  Separately billable procedures and treating other patients and teaching time   Critical care was necessary to treat or prevent imminent or life-threatening deterioration of the following conditions:  Sepsis   Critical care was time spent personally by me on the following activities:  Development of treatment plan with patient or surrogate, evaluation of patient's response to treatment, examination of patient, obtaining history from patient or surrogate, review of old charts, re-evaluation of patient's condition, pulse oximetry, ordering and performing treatments and interventions and ordering and review of laboratory studies    Medications Ordered in ED Medications - No data to display  ED Course/ Medical Decision Making/ A&P Clinical Course as of 08/02/21 2316  Sun Aug 02, 2021  0707 Septic, pending [facility] [MK]    Clinical Course User Index [MK] Teressa Lower, MD                           Medical Decision Making Amount and/or Complexity of Data Reviewed Labs: ordered. Radiology: ordered. ECG/medicine tests: ordered.  Risk OTC drugs. Decision regarding hospitalization.   Sepsis labs initiated. No abx initially 2/2 unclear source. Reportedly hypoxic but doesn't seem so on arrival here.   CXR viewed by myself and appears to have RLL pneumonia, will await confirmation by rads.   Radiology agrees, CAP abx initiated. Fluids initiated. Sepsis called. Daughter states that she normally talks and seems more altered than she has seen her recently. Will add on head CT.   INR supratherapeutic. Is not actively bleeding. Initiated institutional reversal order set.   Care transferred pending ct/UA for admission.   Final Clinical Impression(s) / ED Diagnoses Final diagnoses:  None    Rx / DC Orders ED Discharge Orders     None         Jan Olano, Corene Cornea, MD 08/02/21 2316

## 2021-08-02 NOTE — ED Triage Notes (Signed)
EMS arrival. Given 353mL fluid enroute. Connie Ruiz longtime resident. Nonverbal at base. Staff says much more interactive. Temp 101.66f per EMS.

## 2021-08-02 NOTE — H&P (Signed)
History and Physical    Patient: Connie Ruiz:[id number] DOB: May 11, 1961 DOA: 08/02/2021 DOS: the patient was seen and examined on 08/02/2021 PCP: [name], [name], MD  Patient coming [facility]: [facility]; NOK:  Daughter, [name], [phone]   Chief Complaint: AMS  HPI: Connie Ruiz is a 61 y.o. female with medical history significant of chronic diastolic CHF; DM; HTN; HLD; CVA with R-sided weakness and chronic cognitive impairment (mostly nonverbal); renal transplant; SLE; and PE on Coumadin presenting with AMS.   The patient was in respiratory distress at the time of my evaluation with O2 sats down to upper 70s and marked thick yellow-green secretions.  The patient is nonverbal, does respond to some commands.  I was unable to reach her daughter by telephone.      ER Course:  AMS, sepsis with RLL PNA.  Less interactive than usual, febrile to 104.  INR 5.3 - given IV vitamin K.  [name].  Head CT with chronic changes, nothing acute.     Review of Systems: unable to review all systems due to the inability of the patient to answer questions. Past Medical History:  Diagnosis Date   Anxiety    Candida esophagitis ([place]) 11/12/2014   CEREBROVASCULAR ACCIDENT, ACUTE 04/15/2010   CLOSTRIDIUM DIFFICILE COLITIS, HX OF 08/21/2007   CONGESTIVE HEART FAILURE 08/21/2007   Current use of long term anticoagulation    Dr. Andree Elk, [facility]   CVA 04/17/2010   Depression    Dr. Andree Elk, [place], TYPE II 08/21/2007   DVT, HX OF 08/21/2007   GERD 08/21/2007   GOUT 08/21/2007   History of stroke with residual effects    HYPERLIPIDEMIA 08/21/2007   HYPERTENSION 08/21/2007   Dr. Andree Elk, [place], HX OF 08/22/2007   s/p renal transplant-Dr. Bonney Leitz   LUPUS 08/21/2007   OSTEOPOROSIS 08/21/2007   Rheumatol at [facility]   Pulmonary embolism ([place]) 07/16/2010   Renal failure    RENAL INSUFFICIENCY 08/21/2007   Right sided weakness     Steroid-induced hyperglycemia 11/09/2014   Tachycardia    THYROID NODULE, LEFT 04/10/2009   Past Surgical History:  Procedure Laterality Date   BIOPSY  02/04/2021   Procedure: BIOPSY;  Surgeon: [name], MD;  Location: [facility];  Service: Endoscopy;;   CESAREAN SECTION     CHOLECYSTECTOMY     COLONOSCOPY  06/2000   [facility]    ENTEROSCOPY N/A 11/11/2014   Procedure: ENTEROSCOPY;  Surgeon: Ladene Artist, MD;  Location: [facility];  Service: Endoscopy;  Laterality: N/A;   FLEXIBLE SIGMOIDOSCOPY N/A 02/04/2021   Procedure: FLEXIBLE SIGMOIDOSCOPY;  Surgeon: [name], MD;  Location: [facility];  Service: Endoscopy;  Laterality: N/A;   KIDNEY TRANSPLANT Right 2009   RENAL BIOPSY, OPEN  1981   TUBAL LIGATION     Social History:  reports that she has never smoked. She has never used smokeless tobacco. She reports that she does not drink alcohol and does not use drugs.  Allergies  Allergen Reactions   Oxycodone-Acetaminophen Shortness Of Breath and Nausea Only   Propoxyphene Nausea Only and Shortness Of Breath    States takes tylenol at home   Sulfonamide Derivatives Shortness Of Breath and Nausea Only   Codeine Nausea Only   Hydrocodone-Acetaminophen     unknown   Hydromorphone     On MAR   Other Other (See Comments)    No blood, Jehovaeh Witness    Sulfamethoxazole  Tape     Redness**PAPER TAPE OK**   Gabapentin Anxiety    twitching   Latex Rash   Metoprolol Rash   Morphine And Related Rash    IV site on arm is red, patient reports this is improving.  NO shortness of breath reported. Patient is given dose of Benadryl when taking Morphine    Rosiglitazone Rash    Family History  Problem Relation Age of Onset   Heart attack Mother    Heart disease Father    Asthma Sister    Asthma Daughter    Cancer Maternal Grandfather        prostate   Cancer Paternal Grandfather        colon    Prior to Admission medications   Medication Sig Start Date End Date  Taking? Authorizing Provider  acetaminophen (TYLENOL) 500 MG tablet Take 1,000 mg by mouth every 6 (six) hours as needed for moderate pain.    [provider]  alendronate (FOSAMAX) 70 MG tablet Take 70 mg by mouth once a week. Take with a full glass of water on an empty stomach on Saturdays.    [provider]  Blood Glucose Monitoring Suppl (ACCU-CHEK AVIVA PLUS) w/Device KIT 1 Device by Does not apply route daily. 10/13/18   [name], [name], MD  calcitRIOL (ROCALTROL) 0.25 MCG capsule TAKE 1 CAPSULE(0.25 MCG) BY MOUTH DAILY. NO REFILLS WITHOUT APPOINTMENT Patient taking differently: Take 0.25 mcg by mouth daily. 02/13/19   Renato Shin, MD  carvedilol (COREG) 6.25 MG tablet Take 1 tablet (6.25 mg total) by mouth 2 (two) times daily with a meal. 02/13/21   Little Ishikawa, MD  cholecalciferol (VITAMIN D3) 25 MCG (1000 UNIT) tablet Take 1,000 Units by mouth daily.    [provider]  cholestyramine (QUESTRAN) 4 g packet Take 1 packet (4 g total) by mouth daily. 05/29/21 06/28/21  Amin, Jeanella Flattery, MD  dicyclomine (BENTYL) 10 MG capsule Take 1 capsule (10 mg total) by mouth 3 (three) times daily before meals. 02/13/21   Little Ishikawa, MD  diltiazem [facility]) 360 MG 24 hr capsule Take 1 capsule (360 mg total) by mouth daily. 06/27/19   [name], [name], MD  donepezil (ARICEPT) 10 MG tablet Take 10 mg by mouth at bedtime.  11/18/16   [provider]  esomeprazole (NEXIUM) 20 MG capsule TAKE 1 CAPSULE(20 MG) BY MOUTH DAILY Patient taking differently: Take 20 mg by mouth daily at 12 noon. 07/17/18   Renato Shin, MD  famotidine (PEPCID) 20 MG tablet Take 20 mg by mouth 2 (two) times daily.    [provider]  furosemide (LASIX) 20 MG tablet Take 20 mg by mouth daily.    [provider]  glucose blood (ONE TOUCH ULTRA TEST) test strip Use to check blood sugar 1 time per day 02/12/15   Renato Shin, MD  hydrocortisone (ANUSOL-HC) 25 MG suppository  Place 1 suppository (25 mg total) rectally at bedtime. 05/29/21   Amin, Jeanella Flattery, MD  insulin lispro (HUMALOG) 100 UNIT/ML injection Inject 0-12 Units into the skin 3 (three) times daily before meals. Per Sliding Scale. If blood sugar is: 0-150: 0 units 151-200: 2 units 201-250: 4 units 251-300: 6 units 301-350: 8 units 351-400: 10 units 401-450: 12 units    [provider]  levothyroxine (SYNTHROID) 150 MCG tablet Take 1 tablet (150 mcg total) by mouth daily at 6 (six) AM. 02/14/21   Little Ishikawa, MD  loperamide (IMODIUM) 2  MG capsule Take 2 mg by mouth every 6 (six) hours as needed for diarrhea or loose stools.    [provider]  magnesium oxide (MAG-OX) 400 MG tablet Take 800 mg by mouth daily.  01/11/17   [provider]  mirtazapine (REMERON) 30 MG tablet Take 1 tablet (30 mg total) by mouth at bedtime. 07/28/15   Pieter Partridge, DO  nystatin (MYCOSTATIN/NYSTOP) powder Apply topically 2 (two) times daily. 05/29/21   Amin, Jeanella Flattery, MD  ondansetron (ZOFRAN ODT) 4 MG disintegrating tablet 49m ODT q4 hours prn nausea/vomiting Patient taking differently: Take 4 mg by mouth every 4 (four) hours as needed for nausea or vomiting. 06/15/15   Mesner, JCorene Cornea MD  [facility] LANCETS 303KMISC Use to check blood sugar 1 time per day 02/12/15   ERenato Shin MD  predniSONE (DELTASONE) 5 MG tablet Take 1 tablet (5 mg total) by mouth daily with breakfast. 05/29/21   Amin, AJeanella Flattery MD  PSYLLIUM PO Take 1 packet by mouth daily. 3g/5.4g: 2tsp -mix 1sp in 8[place] water qd    [provider]  sertraline (ZOLOFT) 50 MG tablet Take 50 mg by mouth daily. 05/12/21   [provider]  Skin Protectants, Misc. (EUCERIN) cream Apply 1 application topically daily. To feet    [provider]  sucralfate (CARAFATE) 1 g tablet Take 1 tablet (1 g total) by mouth 4 (four) times daily for 30 days. 08/22/18 05/26/21  Pokhrel, LCorrie Mckusick MD  tacrolimus (PROGRAF)  0.5 MG capsule Take 0.5 mg by mouth See admin instructions. Takes 0.578mcap with 2 25m18macrolimus to equal a total 2.5mg49mery 12 hours 04/17/21   [provider]  tacrolimus (PROGRAF) 1 MG capsule Take 2 mg by mouth See admin instructions. Takes 2 25mg 29msules with Tacrolimus 0.5mg c65mules to = 2.5mg ev49m 12 hours 01/15/18   [provider]  warfarin (COUMADIN) 1 MG tablet Take 0.5 mg by mouth See admin instructions. Takes 1/2 tab with 3mg tab425m equal a total of 3.5mg ever47mther day. Patient not taking: Reported on 05/26/2021    [provider]  warfarin (COUMADIN) 3 MG tablet Take 3 mg by mouth See admin instructions. Takes 1 3mg tab w5m 1/2 of a 25mg tab to62mual a total of 3.5mg every o6mr day Patient not taking: Reported on 05/26/2021    [provider]  warfarin (COUMADIN) 4 MG tablet Take by mouth. Patient not taking: Reported on 05/26/2021 05/18/21   [provider]    Physical Exam: Vitals:   08/02/21 1315 08/02/21 1320 08/02/21 1330 08/02/21 1340  BP: 107/67 (!) 72/35 97/70 108/69  Pulse: (!) 121 (!) 120 (!) 117 (!) 120  Resp: (!) 23 (!) 25 (!) 24 (!) 25  Temp:      TempSrc:      SpO2: 100% 100% 100% 100%  Height:       General:  Appears acutely on chronically ill, in respiratory distress, significant upper airway secretions Eyes:  PERRL, EOMI, normal lids, iris, ?exophthalmos ENT:  grossly normal hearing, lips & tongue, mmm; poor dentition Neck:  no LAD, masses or thyromegaly Cardiovascular:  RR with tachycardia. No LE edema.  Respiratory:   Diffuse rhonchi with significant upper airway noise.  Thick greenish secretions.  Increased respiratory effort.  Down to 79% on [place] O2, placed on NRB. Abdomen:  soft, NT, ND Skin:  no rash or induration seen on limited exam Musculoskeletal:  decreased tone BUE/BLE, no bony  abnormality Psychiatric:  alert, nonverbal, follows some commands, appears to clearly know what she does NOT like and  withdraws accordingly  Neurologic:  unable to effectively perform   Radiological Exams on Admission: Independently reviewed - see discussion in A/P where applicable  CT Head Wo Contrast  Result Date: 08/02/2021 CLINICAL DATA:  Altered mental status, etiology unknown. EXAM: CT HEAD WITHOUT CONTRAST TECHNIQUE: Contiguous axial images were obtained from the base of the skull through the vertex without intravenous contrast. RADIATION DOSE REDUCTION: This exam was performed according to the departmental dose-optimization program which includes automated exposure control, adjustment of the mA and/or kV according to patient size and/or use of iterative reconstruction technique. COMPARISON:  CT 09/10/2019. FINDINGS: Brain: Again noted is a chronic left MCA infarct involving portions of the left frontal, temporal and parietal lobes with ex vacuo expansion of the left lateral ventricle and chronic calcification in anterior left temporal lobe. There is additional finding of atrophy and moderately advanced small vessel disease the latter greater in the left hemisphere, with atrophic ventricular prominence on the right. There are multiple lacunar infarcts in both cerebellar hemispheres, more numerous on the right. No definitive finding of acute cortical based infarct, hemorrhage or mass is seen , no midline shift. Vascular: The carotid siphons and distal vertebral arteries are heavily calcified but there are no hyperdense central vessels. Skull: Normal. Negative for fracture or focal lesion. Sinuses/Orbits: There is increased membrane disease throughout the ethmoid sinus but no fluid levels. Mild membrane thickening in the maxillary sinuses. Other sinuses, bilateral mastoid air cells are clear. Other: None. IMPRESSION: No acute intracranial CT findings. Chronic left MCA territory infarct. Additional chronic change including lacunar infarcts. Obtain MRI if there is concern for occult infarct. Increased membrane disease  in the ethmoid air cells. Electronically Signed   By: Telford Nab M.D.   On: 08/02/2021 07:19   DG CHEST PORT 1 VIEW  Result Date: 08/02/2021 CLINICAL DATA:  Respiratory distress.  Unresponsive. EXAM: PORTABLE CHEST 1 VIEW COMPARISON:  08/02/2021 FINDINGS: Low lung volumes. Cardiac configuration appears unchanged. There is persistent opacity in the RIGHT lung base consistent with atelectasis or infiltrate and possible pleural effusion. There is increased atelectasis in the RIGHT perihilar region. Suspect increased LEFT LOWER lobe opacity, now obscuring the hemidiaphragm. IMPRESSION: Increased LEFT LOWER lobe opacification. Increased RIGHT perihilar atelectasis. Electronically Signed   By: Nolon Nations M.D.   On: 08/02/2021 11:36   DG Chest Port 1 View  Result Date: 08/02/2021 CLINICAL DATA:  Questionable sepsis. EXAM: PORTABLE CHEST 1 VIEW COMPARISON:  AP Lat 01/26/2021. FINDINGS: There are numerous overlying monitor wires. Chronically elevated right hemidiaphragm. There is increased opacity in the right lower lung field concerning for pneumonia and/or pleural effusion. The remaining lungs clear with COPD change. There is cardiomegaly with prominent central pulmonary arteries, aortic atherosclerosis and tortuosity. Stable mediastinum.  Osteopenia and thoracic spondylosis. IMPRESSION: Increased opacity right lower lung field consistent with consolidation, atelectasis, pleural effusion or combination. A PA and lateral study would be helpful. Additional chronic vascular findings discussed above. Electronically Signed   By: Telford Nab M.D.   On: 08/02/2021 05:47    EKG: Independently reviewed.  Sinus tachycardia with rate 113; no evidence of acute ischemia   Labs on Admission: I have personally reviewed the available labs and imaging studies at the time of the admission.  Pertinent labs:    Glucose 68 (repeat requested) BUN 24/Creatinine 1.07/GFR 59 - stable Albumin 2.2 Lactate 0.8 WBC  10.4 Hgb  11 INR 5.3     Assessment and Plan: * Acute hypoxemic respiratory failure ([place])- (present on admission) -Patient presented overnight with sepsis due to PNA -At the time of my evaluation, she had marked secretions and did not appear to be protecting her airway well -Her O2 sats dropped as low as 79% with good pleth -I consulted PCCM and she has since been intubated and will be on their service -I attempted to call her daughter without success  Sepsis due to pneumonia [facility])- (present on admission) -Sepsis indicates life-threatening organ dysfunction with mortality >10%, caused by dysregulation to host response.   -SIRS criteria in this patient includes: Fever, tachycardia, tachypnea, hypoxia -Patient has evidence of acute organ failure with acute respiratory failure that is not easily explained by another condition. -While awaiting blood cultures, this appears to be a preseptic condition. -Sepsis protocol initiated -Suspected source is PNA, likely aspiration PNA -Blood and urine cultures pending -Will admit due to: hemodynamic instability; hypoxemia; AMS that is severe or persisten -Treat with IV Rocephin/Azithromycin initially, but this is likely to be broadened in the ICU -This patient is at risk for shock and may require vasopressors to keep MAP >65 and/or due to lactate >2 despite volume resuscitation; shock is associated with >40% mortality.  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 -INR 5.3 without obvious bleeding -Given her inability to tolerate PO, she was given IV Vitamin K for reversal -Will need f/u INR  Refusal of blood transfusions as patient is [name] -INR was reversed due to this complicating factor should she become more anemic -Hgb is currently stable  Type 2 diabetes mellitus (HCC) -Recent A1c was 6.0, indicating good control -She was borderline low (68) on presentation -Needs f/u  accucheck now to ensure that hypoglycemia is not playing a role in her current presentation  Vascular dementia ([place])- (present on admission) -Appears to have fairly advanced dementia, limited interaction at baseline -Home meds are Remeron, Aricept, Prozac, and Elavil -Goals of care discussion is likely warranted on an ongoing basis  History of stroke with residual effects -Chronic R-sided weakness, aphasia -Appears to be at high risk for aspiration currently -Likely needs swallow evaluation prior to any PO intake  Chronic kidney disease, stage 3a ([place])- (present on admission) -s/p renal transplant -Appears to be stable at this time  Deceased-donor kidney transplant recipient -She is on Tacrolimus and daily prednisone -She will need stress-dosed steroids for now, in all likelihood  Essential hypertension- (present on admission) -Currently with borderline low BP -May need pressors given sepsis and potential for shock -Holding home meds - diltiazem, carvedilol    Total critical care time: 65 minutes Critical care time was exclusive of separately billable procedures and treating other patients. Critical care was necessary to treat or prevent imminent or life-threatening deterioration. Critical care was time spent personally by me on the following activities: development of treatment plan with patient and/or surrogate as well as nursing, discussions with consultants, evaluation of patient's response to treatment, examination of patient, obtaining history from patient or surrogate, ordering and performing treatments and interventions, ordering and review of laboratory studies, ordering and review of radiographic studies, pulse oximetry and re-evaluation of patient's condition.    Advance Care Planning:   Code Status: Full Code   Consults: PCCM - transferred to their service  DVT Prophylaxis: SCDs  Family Communication: I attempted to call her daughter multiple times and was unable to  reach her at  the time of admission  Severity of Illness: The appropriate patient status for this patient is INPATIENT. Inpatient status is judged to be reasonable and necessary in order to provide the required intensity of service to ensure the patient's safety. The patient's presenting symptoms, physical exam findings, and initial radiographic and laboratory data in the context of their chronic comorbidities is felt to place them at high risk for further clinical deterioration. Furthermore, it is not anticipated that the patient will be medically stable for discharge from the hospital within 2 midnights of admission.   * I certify that at the point of admission it is my clinical judgment that the patient will require inpatient hospital care spanning beyond 2 midnights from the point of admission due to high intensity of service, high risk for further deterioration and high frequency of surveillance required.*  Author: Karmen Bongo, MD 08/02/2021 1:43 PM  For on call review [email or web address].

## 2021-08-02 NOTE — Assessment & Plan Note (Signed)
-  Currently with borderline low BP -May need pressors given sepsis and potential for shock -Holding home meds - diltiazem, carvedilol

## 2021-08-02 NOTE — Progress Notes (Signed)
Pt transported on the ventilator from ED26 to 3M07. RT, RN and NT accompanied pt. VSS throughout. Report given to ICU RT. RT will continue to monitor and be available.

## 2021-08-02 NOTE — Assessment & Plan Note (Signed)
-  She is on Tacrolimus and daily prednisone -She will need stress-dosed steroids for now, in all likelihood

## 2021-08-02 NOTE — Progress Notes (Signed)
Attempted Lt IJ and Lt [place] CVL with ultrasound guidance.  Very small caliber vessels on both Rt and Lt.  In review of records she has difficulty IV access and failed dialysis previously because of this.  She eventually got a renal transplant.  Per IV team mandate, we must get approval from a nephrologist to assess her for PICC line.  Will try to get in touch with someone from her nephrology team at [place].    Chesley Mires, MD [place] Pager - [phone] 08/02/2021, 2:21 PM

## 2021-08-02 NOTE — Procedures (Signed)
Intubation Procedure Note  JERYN BERTONI  [id number]  1961-05-31  Date:08/02/21  Time:1:53 PM   Provider Performing:Anissia Wessells Charleen Kirks    Procedure: Intubation (31500)  Indication(s) Respiratory Failure  Consent Risks of the procedure as well as the alternatives and risks of each were explained to the patient and/or caregiver.  Consent for the procedure was obtained and is signed in the bedside chart   Anesthesia Etomidate, Versed, Fentanyl, and Rocuronium   Time Out Verified patient identification, verified procedure, site/side was marked, verified correct patient position, special equipment/implants available, medications/allergies/relevant history reviewed, required imaging and test results available.   Sterile Technique Usual hand hygeine, masks, and gloves were used   Procedure Description Patient positioned in bed supine.  Sedation given as noted above.  Patient was intubated with endotracheal tube using Glidescope.  View was Grade 1 full glottis .  Number of attempts was 1.  Colorimetric CO2 detector was consistent with tracheal placement.   Complications/Tolerance None; patient tolerated the procedure well. Chest X-ray is ordered to verify placement.   EBL Minimal   Specimen(s) None

## 2021-08-02 NOTE — Assessment & Plan Note (Signed)
-  Recent A1c was 6.0, indicating good control -She was borderline low (68) on presentation -Needs f/u accucheck now to ensure that hypoglycemia is not playing a role in her current presentation

## 2021-08-02 NOTE — Assessment & Plan Note (Signed)
-  Appears to have fairly advanced dementia, limited interaction at baseline -Home meds are Remeron, Aricept, Prozac, and Elavil -Goals of care discussion is likely warranted on an ongoing basis

## 2021-08-02 NOTE — Assessment & Plan Note (Signed)
-  s/p renal transplant -Appears to be stable at this time

## 2021-08-02 NOTE — Assessment & Plan Note (Signed)
-  Sepsis indicates life-threatening organ dysfunction with mortality >10%, caused by dysregulation to host response.   -SIRS criteria in this patient includes: Fever, tachycardia, tachypnea, hypoxia -Patient has evidence of acute organ failure with acute respiratory failure that is not easily explained by another condition. -While awaiting blood cultures, this appears to be a preseptic condition. -Sepsis protocol initiated -Suspected source is PNA, likely aspiration PNA -Blood and urine cultures pending -Will admit due to: hemodynamic instability; hypoxemia; AMS that is severe or persisten -Treat with IV Rocephin/Azithromycin initially, but this is likely to be broadened in the ICU -This patient is at risk for shock and may require vasopressors to keep MAP >65 and/or due to lactate >2 despite volume resuscitation; shock is associated with >40% mortality.

## 2021-08-02 NOTE — Assessment & Plan Note (Addendum)
-  Patient is on Coumadin at her facility for h/o VTE -INR 5.3 without obvious bleeding -Given her inability to tolerate PO, she was given IV Vitamin K for reversal -Will need f/u INR

## 2021-08-03 ENCOUNTER — Other Ambulatory Visit (HOSPITAL_COMMUNITY): Payer: Self-pay

## 2021-08-03 ENCOUNTER — Inpatient Hospital Stay (HOSPITAL_COMMUNITY): Payer: Medicare Other

## 2021-08-03 DIAGNOSIS — E1122 Type 2 diabetes mellitus with diabetic chronic kidney disease: Secondary | ICD-10-CM | POA: Diagnosis not present

## 2021-08-03 DIAGNOSIS — R791 Abnormal coagulation profile: Secondary | ICD-10-CM | POA: Diagnosis not present

## 2021-08-03 DIAGNOSIS — Z992 Dependence on renal dialysis: Secondary | ICD-10-CM

## 2021-08-03 DIAGNOSIS — J9601 Acute respiratory failure with hypoxia: Secondary | ICD-10-CM | POA: Diagnosis not present

## 2021-08-03 DIAGNOSIS — A419 Sepsis, unspecified organism: Secondary | ICD-10-CM

## 2021-08-03 DIAGNOSIS — R6521 Severe sepsis with septic shock: Secondary | ICD-10-CM

## 2021-08-03 DIAGNOSIS — L8990 Pressure ulcer of unspecified site, unspecified stage: Secondary | ICD-10-CM | POA: Insufficient documentation

## 2021-08-03 DIAGNOSIS — N186 End stage renal disease: Secondary | ICD-10-CM

## 2021-08-03 LAB — POCT I-STAT 7, (LYTES, BLD GAS, ICA,H+H)
Acid-base deficit: 8 mmol/L — ABNORMAL HIGH (ref 0.0–2.0)
Bicarbonate: 19.2 mmol/L — ABNORMAL LOW (ref 20.0–28.0)
Calcium, Ion: 1.21 mmol/L (ref 1.15–1.40)
HCT: 29 % — ABNORMAL LOW (ref 36.0–46.0)
Hemoglobin: 9.9 g/dL — ABNORMAL LOW (ref 12.0–15.0)
O2 Saturation: 97 %
Patient temperature: 98.1
Potassium: 3.7 mmol/L (ref 3.5–5.1)
Sodium: 140 mmol/L (ref 135–145)
TCO2: 21 mmol/L — ABNORMAL LOW (ref 22–32)
pCO2 arterial: 44.5 mmHg (ref 32–48)
pH, Arterial: 7.242 — ABNORMAL LOW (ref 7.35–7.45)
pO2, Arterial: 100 mmHg (ref 83–108)

## 2021-08-03 LAB — CBC
HCT: 40.5 % (ref 36.0–46.0)
Hemoglobin: 12.1 g/dL (ref 12.0–15.0)
MCH: 28.3 pg (ref 26.0–34.0)
MCHC: 29.9 g/dL — ABNORMAL LOW (ref 30.0–36.0)
MCV: 94.8 fL (ref 80.0–100.0)
Platelets: 157 10*3/uL (ref 150–400)
RBC: 4.27 MIL/uL (ref 3.87–5.11)
RDW: 16.3 % — ABNORMAL HIGH (ref 11.5–15.5)
WBC: 24.2 10*3/uL — ABNORMAL HIGH (ref 4.0–10.5)
nRBC: 0 % (ref 0.0–0.2)

## 2021-08-03 LAB — URINALYSIS, ROUTINE W REFLEX MICROSCOPIC
Bacteria, UA: NONE SEEN
Bilirubin Urine: NEGATIVE
Glucose, UA: NEGATIVE mg/dL
Hgb urine dipstick: NEGATIVE
Ketones, ur: 5 mg/dL — AB
Nitrite: NEGATIVE
Protein, ur: 30 mg/dL — AB
Specific Gravity, Urine: 1.029 (ref 1.005–1.030)
pH: 5 (ref 5.0–8.0)

## 2021-08-03 LAB — BASIC METABOLIC PANEL
Anion gap: 12 (ref 5–15)
BUN: 29 mg/dL — ABNORMAL HIGH (ref 6–20)
CO2: 16 mmol/L — ABNORMAL LOW (ref 22–32)
Calcium: 7.8 mg/dL — ABNORMAL LOW (ref 8.9–10.3)
Chloride: 115 mmol/L — ABNORMAL HIGH (ref 98–111)
Creatinine, Ser: 1.27 mg/dL — ABNORMAL HIGH (ref 0.44–1.00)
GFR, Estimated: 48 mL/min — ABNORMAL LOW (ref >60)
Glucose, Bld: 152 mg/dL — ABNORMAL HIGH (ref 70–99)
Potassium: 5.3 mmol/L — ABNORMAL HIGH (ref 3.5–5.1)
Sodium: 143 mmol/L (ref 135–145)

## 2021-08-03 LAB — HEPARIN LEVEL (UNFRACTIONATED): Heparin Unfractionated: 0.3 IU/mL (ref 0.30–0.70)

## 2021-08-03 LAB — GLUCOSE, CAPILLARY
Glucose-Capillary: 138 mg/dL — ABNORMAL HIGH (ref 70–99)
Glucose-Capillary: 154 mg/dL — ABNORMAL HIGH (ref 70–99)
Glucose-Capillary: 151 mg/dL — ABNORMAL HIGH (ref 70–99)
Glucose-Capillary: 152 mg/dL — ABNORMAL HIGH (ref 70–99)
Glucose-Capillary: 78 mg/dL (ref 70–99)
Glucose-Capillary: 84 mg/dL (ref 70–99)

## 2021-08-03 LAB — PROTIME-INR
INR: 1.8 — ABNORMAL HIGH (ref 0.8–1.2)
Prothrombin Time: 20.9 seconds — ABNORMAL HIGH (ref 11.4–15.2)

## 2021-08-03 LAB — TRIGLYCERIDES: Triglycerides: 136 mg/dL (ref <150)

## 2021-08-03 LAB — STREP PNEUMONIAE URINARY ANTIGEN: Strep Pneumo Urinary Antigen: NEGATIVE

## 2021-08-03 MED ORDER — PANTOPRAZOLE SODIUM 40 MG PO TBEC
40.0000 mg | DELAYED_RELEASE_TABLET | Freq: Every day | ORAL | Status: DC
  Administered 2021-08-04: 40 mg via ORAL
  Filled 2021-08-03: qty 1

## 2021-08-03 MED ORDER — TACROLIMUS 1 MG PO CAPS
2.5000 mg | ORAL_CAPSULE | Freq: Two times a day (BID) | ORAL | Status: DC
  Filled 2021-08-03: qty 1

## 2021-08-03 MED ORDER — IPRATROPIUM-ALBUTEROL 0.5-2.5 (3) MG/3ML IN SOLN
3.0000 mL | RESPIRATORY_TRACT | Status: DC
  Administered 2021-08-03 – 2021-08-04 (×4): 3 mL via RESPIRATORY_TRACT
  Filled 2021-08-03 (×4): qty 3

## 2021-08-03 MED ORDER — IPRATROPIUM-ALBUTEROL 0.5-2.5 (3) MG/3ML IN SOLN
3.0000 mL | Freq: Once | RESPIRATORY_TRACT | Status: AC | PRN
  Administered 2021-08-03: 3 mL via RESPIRATORY_TRACT
  Filled 2021-08-03: qty 3

## 2021-08-03 MED ORDER — CHLORHEXIDINE GLUCONATE 0.12 % MT SOLN
15.0000 mL | Freq: Two times a day (BID) | OROMUCOSAL | Status: DC
  Administered 2021-08-03 – 2021-08-13 (×18): 15 mL via OROMUCOSAL
  Filled 2021-08-03 (×15): qty 15

## 2021-08-03 MED ORDER — TACROLIMUS 1 MG PO CAPS
1.0000 mg | ORAL_CAPSULE | Freq: Two times a day (BID) | ORAL | Status: DC
  Administered 2021-08-03 – 2021-08-14 (×22): 1 mg via SUBLINGUAL
  Filled 2021-08-03 (×24): qty 1

## 2021-08-03 MED ORDER — DOCUSATE SODIUM 100 MG PO CAPS
100.0000 mg | ORAL_CAPSULE | Freq: Two times a day (BID) | ORAL | Status: DC
  Filled 2021-08-03 (×2): qty 1

## 2021-08-03 MED ORDER — ORAL CARE MOUTH RINSE
15.0000 mL | Freq: Two times a day (BID) | OROMUCOSAL | Status: DC
  Administered 2021-08-04 – 2021-08-14 (×13): 15 mL via OROMUCOSAL

## 2021-08-03 MED ORDER — DEXMEDETOMIDINE HCL IN NACL 400 MCG/100ML IV SOLN
0.4000 ug/kg/h | INTRAVENOUS | Status: DC
  Administered 2021-08-03: 0.4 ug/kg/h via INTRAVENOUS
  Filled 2021-08-03: qty 100

## 2021-08-03 MED ORDER — TACROLIMUS 1 MG/ML ORAL SUSPENSION
2.5000 mg | Freq: Two times a day (BID) | ORAL | Status: DC
  Filled 2021-08-03: qty 2.5

## 2021-08-03 MED ORDER — ACETAMINOPHEN 325 MG PO TABS: 650.0000 mg | ORAL_TABLET | Freq: Four times a day (QID) | ORAL | Status: DC | PRN

## 2021-08-03 MED ORDER — RACEPINEPHRINE HCL 2.25 % IN NEBU: 0.5000 mL | INHALATION_SOLUTION | Freq: Once | RESPIRATORY_TRACT | Status: DC

## 2021-08-03 MED ORDER — IPRATROPIUM-ALBUTEROL 0.5-2.5 (3) MG/3ML IN SOLN
RESPIRATORY_TRACT | Status: AC
  Administered 2021-08-03: 3 mL
  Filled 2021-08-03: qty 3

## 2021-08-03 MED ORDER — ACETAMINOPHEN 650 MG RE SUPP: 650.0000 mg | Freq: Four times a day (QID) | RECTAL | Status: DC | PRN

## 2021-08-03 MED ORDER — LEVOTHYROXINE SODIUM 75 MCG PO TABS
150.0000 ug | ORAL_TABLET | Freq: Every day | ORAL | Status: DC
  Administered 2021-08-04 – 2021-08-06 (×2): 150 ug via ORAL
  Filled 2021-08-03 (×2): qty 2

## 2021-08-03 MED ORDER — POLYETHYLENE GLYCOL 3350 17 G PO PACK
17.0000 g | PACK | Freq: Every day | ORAL | Status: DC
  Filled 2021-08-03: qty 1

## 2021-08-03 MED ORDER — FUROSEMIDE 10 MG/ML IJ SOLN
40.0000 mg | Freq: Once | INTRAMUSCULAR | Status: AC
Start: 2021-08-03 — End: 2021-08-03
  Administered 2021-08-03: 40 mg via INTRAVENOUS
  Filled 2021-08-03: qty 4

## 2021-08-03 MED ORDER — HEPARIN (PORCINE) 25000 UT/250ML-% IV SOLN
950.0000 [IU]/h | INTRAVENOUS | Status: DC
  Administered 2021-08-03: 900 [IU]/h via INTRAVENOUS
  Filled 2021-08-03: qty 250

## 2021-08-03 NOTE — Progress Notes (Signed)
NAME:  Connie Ruiz, MRN:  [id number], DOB:  [birth year], LOS: 1 ADMISSION DATE:  08/02/2021, CONSULTATION DATE:  08/02/2021 REFERRING MD:  Dr. Lorin Mercy, [facility], CHIEF COMPLAINT:  Gurgling respirations   History of Present Illness:  61 yo female from [facility] with fever 101.3.  SpO2 in 70s on room air.  Noted to have gurgling respirations and chest xray showed findings of aspiration pneumonia.  She has prior CVA and is non verbal.   She was not able to protect airway and required intubation.  She has hx of renal disease and previously on dialysis for 5 years.  She failed access multiple times and was transitioned to peritoneal dialysis.  She had renal transplant in November 2005.  She is followed by nephrology at [place].  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, SLE, s/p renal transplant, PE on coumadin, [name], Herpes zoster, Gout, HOCM, Enterococcal endocarditis, GERD, Urinary incontinence, Rectal bleeding  Significant Hospital Events: Including procedures, antibiotic start and stop dates in addition to other pertinent events   2/26 Admit, intubated, start antibiotics, PICC line  2/27 On vent, remains on Levophed 30mcg, Vanc, Cefepime   Interim History / Subjective:  No overnight events, remains on pressors  Got PICC line yesterday  Objective   Blood pressure 98/70, pulse 95, temperature 98.1 F (36.7 C), temperature source Axillary, resp. rate (!) 28, height 5' (1.524 m), weight 79.6 kg, SpO2 100 %.    Vent Mode: PRVC FiO2 (%):  [40 %-100 %] 40 % Set Rate:  [24 bmp-28 bmp] 28 bmp Vt Set:  [360 mL] 360 mL PEEP:  [8 cmH20] 8 cmH20 Plateau Pressure:  [19 cmH20-21 cmH20] 19 cmH20   Intake/Output Summary (Last 24 hours) at 08/03/2021 0856 Last data filed at 08/03/2021 0600 Gross per 24 hour  Intake 2637.32 ml  Output 200 ml  Net 2437.32 ml    Filed Weights   08/03/21  0130  Weight: 79.6 kg     General:  elderly, critically ill-appearing F intubated and sedated  HEENT: MM pink/moist, sclera anicteric, pupils equal  Neuro: examined on 77mcg propofol, opens eyes to pain and stimulation but not following commands  CV: s1s2 rrr, no m/r/g PULM:  course breath sounds bilaterally, on full vent support  GI: soft, bsx4 active  Extremities: warm/dry, no edema, decreased muscle mass Skin: no rashes or lesions   Labs reviewed  WBC 24k pH 7.24, 44, 100, 19 BMP pending  Resolved Hospital Problem list     Assessment & Plan:   Acute hypoxic respiratory failure with compromised airway in setting of aspiration pneumonia, septic shock -CXR with persistent LLL atelectasis vs consolidation -MRSA negative, d/c Vanc and continue Zosyn, follow blood and respiratory cultures -SBT/SAT this morning -titrate Vent setting to maintain SpO2 greater than or equal to 90%. -HOB elevated 30 degrees. -Plateau pressures less than 30 cm H20.  -Follow chest x-ray, ABG prn.   -Bronchial hygiene and RT/bronchodilator protocol. -continue Levophed to maintain MAP >65, has PICC line      Hx of SLE s/p renal transplant. - followed by nephrology at [place] - on chronic prednisone 5mg , continue stress dose steroids - d/w pharmacy and will resume prograff   Hx of PE on coumadin with elevated  INR. - Coumadin held and received  Vit K on 2/26 - INR 1.8, start heparin gtt today  Acute metabolic encephalopathy from  hypoxia. Hx of CVA with aphasia and Rt side weakness. -improved - RASS goal 0 to -1  Best Practice (right click and "Reselect all SmartList Selections" daily)   Diet/type: NPO DVT prophylaxis: SCD GI prophylaxis: PPI Lines: N/A Foley:  Yes, and it is still needed Code Status:  full code Last date of multidisciplinary goals of care discussion [family update pending 2/27]  Labs   CBC: Recent Labs  Lab 08/02/21 0511 08/02/21 1515 08/03/21 0135  08/03/21 0428  WBC 10.4  --  24.2*  --   NEUTROABS 6.6  --   --   --   HGB 11.0* 8.8* 12.1 9.9*  HCT 37.1 26.0* 40.5 29.0*  MCV 97.4  --  94.8  --   PLT 166  --  157  --      Basic Metabolic Panel: Recent Labs  Lab 08/02/21 0511 08/02/21 1515 08/03/21 0428  NA 140 141 140  K 4.3 3.3* 3.7  CL 107  --   --   CO2 20*  --   --   GLUCOSE 68*  --   --   BUN 24*  --   --   CREATININE 1.07*  --   --   CALCIUM 8.0*  --   --     GFR: Estimated Creatinine Clearance: 52.2 mL/min (A) (by C-G formula based on SCr of 1.07 mg/dL (H)). Recent Labs  Lab 08/02/21 0511 08/03/21 0135  WBC 10.4 24.2*  LATICACIDVEN 0.8  --      Liver Function Tests: Recent Labs  Lab 08/02/21 0511  AST 17  ALT 10  ALKPHOS 48  BILITOT 0.9  PROT 5.1*  ALBUMIN 2.2*    No results for input(s): LIPASE, AMYLASE in the last 168 hours. No results for input(s): AMMONIA in the last 168 hours.  ABG    Component Value Date/Time   PHART 7.242 (L) 08/03/2021 0428   PCO2ART 44.5 08/03/2021 0428   PO2ART 100 08/03/2021 0428   HCO3 19.2 (L) 08/03/2021 0428   TCO2 21 (L) 08/03/2021 0428   ACIDBASEDEF 8.0 (H) 08/03/2021 0428   O2SAT 97 08/03/2021 0428      Coagulation Profile: Recent Labs  Lab 08/02/21 0511 08/03/21 0135  INR 5.3* 1.8*     Cardiac Enzymes: No results for input(s): CKTOTAL, CKMB, CKMBINDEX, TROPONINI in the last 168 hours.  HbA1C: Hgb A1c MFr Bld  Date/Time Value Ref Range Status  05/27/2021 06:20 AM 6.0 (H) 4.8 - 5.6 % Final    Comment:    (NOTE) Pre diabetes:          5.7%-6.4%  Diabetes:              >6.4%  Glycemic control for   <7.0% adults with diabetes   01/27/2021 10:09 AM 8.2 (H) 4.8 - 5.6 % Final    Comment:    (NOTE) Pre diabetes:          5.7%-6.4%  Diabetes:              >6.4%  Glycemic control for   <7.0% adults with diabetes     CBG: Recent Labs  Lab 08/02/21 1511 08/02/21 1957 08/02/21 2318 08/03/21 0333 08/03/21 0735  GLUCAP 102*  122* 118* 138* 154*    Review of Systems:   Unable to obtain  Past Medical History:  She,  has a past medical history of Anxiety, Candida esophagitis ([place]) (11/12/2014), CEREBROVASCULAR ACCIDENT, ACUTE (04/15/2010), CLOSTRIDIUM DIFFICILE COLITIS, HX OF (08/21/2007), CONGESTIVE HEART FAILURE (08/21/2007),  Current use of long term anticoagulation, CVA (04/17/2010), Depression, DIABETES MELLITUS, TYPE II (08/21/2007), DVT, HX OF (08/21/2007), GERD (08/21/2007), GOUT (08/21/2007), History of stroke with residual effects, HYPERLIPIDEMIA (08/21/2007), HYPERTENSION (08/21/2007), KIDNEY TRANSPLANTATION, HX OF (08/22/2007), LUPUS (08/21/2007), OSTEOPOROSIS (08/21/2007), Pulmonary embolism ([place]) (07/16/2010), Renal failure, RENAL INSUFFICIENCY (08/21/2007), Right sided weakness, Steroid-induced hyperglycemia (11/09/2014), Tachycardia, and THYROID NODULE, 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   Surgical History:   Past Surgical History:  Procedure Laterality Date   BIOPSY  02/04/2021   Procedure: BIOPSY;  Surgeon: [name], MD;  Location: [facility];  Service: Endoscopy;;   CESAREAN SECTION     CHOLECYSTECTOMY     COLONOSCOPY  06/2000   [facility]    ENTEROSCOPY N/A 11/11/2014   Procedure: ENTEROSCOPY;  Surgeon: Ladene Artist, MD;  Location: [facility];  Service: Endoscopy;  Laterality: N/A;   FLEXIBLE SIGMOIDOSCOPY N/A 02/04/2021   Procedure: FLEXIBLE SIGMOIDOSCOPY;  Surgeon: [name], MD;  Location: [facility] END[facility];  Service: Endoscopy;  Laterality: N/A;   KIDNEY TRANSPLANT Right 2009   RENAL BIOPSY, OPEN  1981   TUBAL LIGATION       Social History:   reports that she has never smoked. She has never used smokeless tobacco. She reports that she does not drink alcohol and does not use drugs.   Family History:  Her family history includes Asthma in her daughter and sister; Cancer in her maternal grandfather and paternal grandfather; Heart attack in her mother; Heart disease in her father.   Allergies Allergies  Allergen  Reactions   Oxycodone-Acetaminophen Shortness Of Breath and Nausea Only   Propoxyphene Nausea Only and Shortness Of Breath    States takes tylenol at home   Sulfonamide Derivatives Shortness Of Breath and Nausea Only   Codeine Nausea Only   Hydrocodone-Acetaminophen     unknown   Hydromorphone     On MAR   Other Other (See Comments)    No blood, Jehovaeh Witness    Sulfamethoxazole    Tape     Redness**PAPER TAPE OK**   Gabapentin Anxiety    twitching   Latex Rash   Metoprolol Rash   Morphine And Related Rash    IV site on arm is red, patient reports this is improving.  NO shortness of breath reported. Patient is given dose of Benadryl when taking Morphine    Rosiglitazone Rash     Home Medications  Prior to Admission medications   Medication Sig Start Date End Date Taking? Authorizing Provider  acetaminophen (TYLENOL) 500 MG tablet Take 1,000 mg by mouth every 6 (six) hours as needed for moderate pain.   Yes [provider]  alendronate (FOSAMAX) 70 MG tablet Take 70 mg by mouth every Saturday.   Yes [provider]  Amino Acids-Protein Hydrolys (FEEDING SUPPLEMENT, PRO-STAT SUGAR FREE 64,) LIQD Take 30 mLs by mouth in the morning and at bedtime.   Yes [provider]  amitriptyline (ELAVIL) 10 MG tablet Take 10 mg by mouth at bedtime.   Yes [provider]  calcitRIOL (ROCALTROL) 0.25 MCG capsule TAKE 1 CAPSULE(0.25 MCG) BY MOUTH DAILY. NO REFILLS WITHOUT APPOINTMENT Patient taking differently: Take 0.25 mcg by mouth daily. 02/13/19  Yes Renato Shin, MD  carvedilol (COREG) 6.25 MG tablet Take 1 tablet (6.25 mg total) by mouth 2 (two) times daily with a meal. 02/13/21  Yes Little Ishikawa, MD  cholecalciferol (VITAMIN D3) 25 MCG (1000 UNIT) tablet Take 1,000 Units by mouth daily.   Yes [provider]  cholestyramine (  QUESTRAN) 4 g packet Take 1 packet (4 g total) by mouth daily. 05/29/21 09/12/21 Yes Amin, Jeanella Flattery, MD   dicyclomine (BENTYL) 10 MG capsule Take 1 capsule (10 mg total) by mouth 3 (three) times daily before meals. 02/13/21  Yes Little Ishikawa, MD  diltiazem [facility]) 360 MG 24 hr capsule Take 1 capsule (360 mg total) by mouth daily. 06/27/19  Yes [name], [name], MD  donepezil (ARICEPT) 10 MG tablet Take 10 mg by mouth at bedtime.  11/18/16  Yes [provider]  esomeprazole (NEXIUM) 20 MG capsule TAKE 1 CAPSULE(20 MG) BY MOUTH DAILY Patient taking differently: Take 20 mg by mouth daily at 12 noon. 07/17/18  Yes Renato Shin, MD  famotidine (PEPCID) 20 MG tablet Take 20 mg by mouth 2 (two) times daily.   Yes [provider]  FLUoxetine (PROZAC) 20 MG capsule Take 20 mg by mouth daily.   Yes [provider]  furosemide (LASIX) 20 MG tablet Take 20 mg by mouth daily.   Yes [provider]  hydrocortisone (ANUSOL-HC) 25 MG suppository Place 1 suppository (25 mg total) rectally at bedtime. 05/29/21  Yes Amin, Ankit Chirag, MD  insulin lispro (HUMALOG) 100 UNIT/ML injection Inject 0-12 Units into the skin 3 (three) times daily before meals. Per Sliding Scale. If blood sugar is: 0-150: 0 units 151-200: 2 units 201-250: 4 units 251-300: 6 units 301-350: 8 units 351-400: 10 units 401-450: 12 units   Yes [provider]  lactose free nutrition (BOOST) LIQD Take 237 mLs by mouth in the morning and at bedtime.   Yes [provider]  levothyroxine (SYNTHROID) 150 MCG tablet Take 1 tablet (150 mcg total) by mouth daily at 6 (six) AM. Patient taking differently: Take 150 mcg by mouth daily before breakfast. 02/14/21  Yes Little Ishikawa, MD  loperamide (IMODIUM) 2 MG capsule Take 2 mg by mouth every 6 (six) hours as needed for diarrhea or loose stools.   Yes [provider]  magnesium chloride (SLOW-MAG) 64 MG TBEC SR tablet Take 1 tablet by mouth daily.   Yes [provider]  mirtazapine (REMERON) 30 MG tablet Take 1 tablet (30 mg  total) by mouth at bedtime. 07/28/15  Yes Jaffe, Adam R, DO  morphine (MSIR) 15 MG tablet Take 15 mg by mouth every 4 (four) hours as needed (pain).   Yes [provider]  Multiple Vitamin (MULTIVITAMIN) tablet Take 1 tablet by mouth daily.   Yes [provider]  ondansetron (ZOFRAN ODT) 4 MG disintegrating tablet 4mg  ODT q4 hours prn nausea/vomiting Patient taking differently: Take 4 mg by mouth every 4 (four) hours as needed for nausea or vomiting. 06/15/15  Yes Mesner, Corene Cornea, MD  potassium chloride SA (KLOR-CON M) 20 MEQ tablet Take 20 mEq by mouth daily.   Yes [provider]  predniSONE (DELTASONE) 5 MG tablet Take 1 tablet (5 mg total) by mouth daily with breakfast. Patient taking differently: Take 5 mg by mouth daily. Continuous 05/29/21  Yes Amin, Ankit Chirag, MD  PSYLLIUM PO Take 11.8 g by mouth daily. Reguloid 3g/5.4g   Yes [provider]  Skin Protectants, Misc. (EUCERIN) cream Apply 1 application topically every evening. To feet   Yes [provider]  tacrolimus (PROGRAF) 0.5 MG capsule Take 0.5 mg by mouth See admin instructions. 0.5mg  every 12 hours along with Tacrolimus 2mg  dose for a total dose of 2.5mg  every 12 hours. 04/17/21  Yes [provider]  tacrolimus (PROGRAF)  1 MG capsule Take 2 mg by mouth See admin instructions. 2mg  every 12 hours along with Tacrolimus 0.5mg  dose for a total dose of 2.5mg  every 12 hours. 01/15/18  Yes [provider]  nystatin (MYCOSTATIN/NYSTOP) powder Apply topically 2 (two) times daily. Patient not taking: Reported on 08/02/2021 05/29/21   Damita Lack, MD     Critical care time: 45 minutes     CRITICAL CARE Performed by: Otilio Carpen Zeinab Rodwell   Total critical care time: 45 minutes  Critical care time was exclusive of separately billable procedures and treating other patients.  Critical care was necessary to treat or prevent imminent or life-threatening deterioration.  Critical  care was time spent personally by me on the following activities: development of treatment plan with patient and/or surrogate as well as nursing, discussions with consultants, evaluation of patient's response to treatment, examination of patient, obtaining history from patient or surrogate, ordering and performing treatments and interventions, ordering and review of laboratory studies, ordering and review of radiographic studies, pulse oximetry and re-evaluation of patient's condition.   Otilio Carpen Riana Tessmer, PA-C [facility] See Amion for pager If no response to pager , please call [phone] until 7pm After 7:00 pm call [place]  [phone]

## 2021-08-03 NOTE — TOC Benefit Eligibility Note (Signed)
Patient Advocate Encounter  Insurance verification completed.    The patient is currently admitted and upon discharge could be taking Eliquis 5 mg.  The current 30 day co-pay is, $0.00.   The patient is currently admitted and upon discharge could be taking Xarelto 20 mg.  The current 30 day co-pay is, $0.00.   The patient is insured through [employer] Medicare Part D     Connie Ruiz, CPhT Pharmacy Patient Advocate Specialist  Pharmacy Patient Advocate Team Direct Number: [phone]  Fax: [phone]        

## 2021-08-03 NOTE — Progress Notes (Signed)
Pts Qtc is 0.49 today, was 0.48 yesterday.  Relayed to CCM MD and pharmacist.

## 2021-08-03 NOTE — Progress Notes (Signed)
ANTICOAGULATION CONSULT NOTE - Follow Up Consult  Pharmacy Consult for Heparin drip Indication: history of pulmonary embolus  Allergies  Allergen Reactions   Oxycodone-Acetaminophen Shortness Of Breath and Nausea Only   Propoxyphene Nausea Only and Shortness Of Breath    States takes tylenol at home   Sulfonamide Derivatives Shortness Of Breath and Nausea Only   Codeine Nausea Only   Hydrocodone-Acetaminophen     unknown   Hydromorphone     On MAR   Other Other (See Comments)    No blood, Jehovaeh Witness    Sulfamethoxazole    Tape     Redness**PAPER TAPE OK**   Gabapentin Anxiety    twitching   Latex Rash   Metoprolol Rash   Morphine And Related Rash    IV site on arm is red, patient reports this is improving.  NO shortness of breath reported. Patient is given dose of Benadryl when taking Morphine    Rosiglitazone Rash    Patient Measurements: Height: 5' (152.4 cm) Weight: 79.6 kg (175 lb 7.8 oz) IBW/kg (Calculated) : 45.5 Heparin Dosing Weight: 63.7 kg  Vital Signs: Temp: 98.4 F (36.9 C) (02/27 1511) Temp Source: Oral (02/27 1511) BP: 114/71 (02/27 1800) Pulse Rate: 116 (02/27 1800)  Labs: Recent Labs    08/02/21 0511 08/02/21 1515 08/03/21 0135 08/03/21 0428 08/03/21 1213 08/03/21 1657  HGB 11.0* 8.8* 12.1 9.9*  --   --   HCT 37.1 26.0* 40.5 29.0*  --   --   PLT 166  --  157  --   --   --   APTT 58*  --   --   --   --   --   LABPROT 48.7*  --  20.9*  --   --   --   INR 5.3*  --  1.8*  --   --   --   HEPARINUNFRC  --   --   --   --   --  0.30  CREATININE 1.07*  --   --   --  1.27*  --      Estimated Creatinine Clearance: 44 mL/min (A) (by C-G formula based on SCr of 1.27 mg/dL (H)).   Medications:  Infusions:   sodium chloride Stopped (08/03/21 0446)   heparin 900 Units/hr (08/03/21 1800)   norepinephrine (LEVOPHED) Adult infusion Stopped (08/03/21 1632)   piperacillin-tazobactam (ZOSYN)  IV 12.5 mL/hr at 08/03/21 1800    Assessment: 61  yo female from [facility] concern for aspiration pneumonia. On warfarin PTA with 3.5mg  and 4mg  alternating.   INR 5.3 on admit. Vitamin K given 2./26 Starting heparin drip until patient stabilization.   2/27 INR 1.8  PM update: heparin level of 0.30 is therapeutic on heparin 900 units/hr. No bleeding noted.   Goal of Therapy:  Heparin level 0.3-0.7 units/ml Monitor platelets by anticoagulation protocol: Yes   Plan:  Increase heparin to 950 units/hr to ensure remains in therapeutic range Follow up heparin level with AM labs  Monitor daily heparin level, CBC and for signs/symptoms of bleeding F/u long term anticoagulation plan  Thank you for allowing pharmacy to be a part of this patients care.  Cristela Felt, PharmD, BCPS Clinical Pharmacist 08/03/2021 6:29 PM

## 2021-08-03 NOTE — Procedures (Signed)
Extubation Procedure Note  Patient Details:   Name: Connie Ruiz DOB: June 20, 1960 MRN: [id number]   Airway Documentation:    Vent end date: 08/03/21 Vent end time: 1255   Evaluation  O2 sats: stable throughout Complications: No apparent complications Patient did tolerate procedure well. Bilateral Breath Sounds: Clear, Diminished   Yes  Pt extubated to [place] 2L, able to speak, no complications. RT will continue to monitor.   Corwin Levins 08/03/2021, 1:01 PM

## 2021-08-03 NOTE — Progress Notes (Signed)
ANTICOAGULATION CONSULT NOTE - Follow Up Consult  Pharmacy Consult for Heparin drip Indication: history of pulmonary embolus  Allergies  Allergen Reactions   Oxycodone-Acetaminophen Shortness Of Breath and Nausea Only   Propoxyphene Nausea Only and Shortness Of Breath    States takes tylenol at home   Sulfonamide Derivatives Shortness Of Breath and Nausea Only   Codeine Nausea Only   Hydrocodone-Acetaminophen     unknown   Hydromorphone     On MAR   Other Other (See Comments)    No blood, Jehovaeh Witness    Sulfamethoxazole    Tape     Redness**PAPER TAPE OK**   Gabapentin Anxiety    twitching   Latex Rash   Metoprolol Rash   Morphine And Related Rash    IV site on arm is red, patient reports this is improving.  NO shortness of breath reported. Patient is given dose of Benadryl when taking Morphine    Rosiglitazone Rash    Patient Measurements: Height: 5' (152.4 cm) Weight: 79.6 kg (175 lb 7.8 oz) IBW/kg (Calculated) : 45.5 Heparin Dosing Weight: 63.7 kg  Vital Signs: Temp: 98.1 F (36.7 C) (02/27 0737) Temp Source: Axillary (02/27 0737) BP: 98/70 (02/27 0600) Pulse Rate: 95 (02/27 0600)  Labs: Recent Labs    08/02/21 0511 08/02/21 1515 08/03/21 0135 08/03/21 0428  HGB 11.0* 8.8* 12.1 9.9*  HCT 37.1 26.0* 40.5 29.0*  PLT 166  --  157  --   APTT 58*  --   --   --   LABPROT 48.7*  --  20.9*  --   INR 5.3*  --  1.8*  --   CREATININE 1.07*  --   --   --     Estimated Creatinine Clearance: 52.2 mL/min (A) (by C-G formula based on SCr of 1.07 mg/dL (H)).   Medications:  Infusions:   sodium chloride Stopped (08/03/21 0446)   dexmedetomidine (PRECEDEX) IV infusion     norepinephrine (LEVOPHED) Adult infusion 6 mcg/min (08/03/21 0600)   piperacillin-tazobactam (ZOSYN)  IV 12.5 mL/hr at 08/03/21 0600   propofol (DIPRIVAN) infusion 10 mcg/kg/min (08/03/21 0600)    Assessment: 61 yo female from [facility] concern for aspiration pneumonia. On warfarin PTA  with 3.5mg  and 4mg  alternating.   INR 5.3 on admit. Vitamin K given 2./26 Starting heparin drip until patient stabilization.   2/27 INR 1.8  Goal of Therapy:  Heparin level 0.3-0.7 units/ml Monitor platelets by anticoagulation protocol: Yes   Plan:  Heparin drip at 900 units/hr Heparin level at 1700 Daily heparin level and CBC ordered Monitor for signs/symptoms of bleed F/u long term anticoagulation plan  Thank you for allowing pharmacy to be a part of this patients care.  Donnald Garre, PharmD Clinical Pharmacist  Please check AMION for all [place] numbers After 10:00 PM, call [place] [phone]

## 2021-08-03 NOTE — Progress Notes (Signed)
Upon reassessment, pt w/ audible wheezing, pt indicates difficulty breathing, no current breathing treatments ordered. Relayed to RT who is now at bedside administering duoneb.

## 2021-08-03 NOTE — Progress Notes (Signed)
Labs back, pts creatinine now elevated and K is 5.3, relayed to CCM

## 2021-08-04 ENCOUNTER — Inpatient Hospital Stay: Payer: Self-pay

## 2021-08-04 ENCOUNTER — Inpatient Hospital Stay (HOSPITAL_COMMUNITY): Payer: Medicare Other

## 2021-08-04 DIAGNOSIS — A419 Sepsis, unspecified organism: Secondary | ICD-10-CM | POA: Diagnosis not present

## 2021-08-04 DIAGNOSIS — R6521 Severe sepsis with septic shock: Secondary | ICD-10-CM | POA: Diagnosis not present

## 2021-08-04 DIAGNOSIS — N1831 Chronic kidney disease, stage 3a: Secondary | ICD-10-CM | POA: Diagnosis not present

## 2021-08-04 DIAGNOSIS — J9601 Acute respiratory failure with hypoxia: Secondary | ICD-10-CM | POA: Diagnosis not present

## 2021-08-04 LAB — URINE CULTURE: Culture: NO GROWTH

## 2021-08-04 LAB — CBC WITH DIFFERENTIAL/PLATELET
Abs Immature Granulocytes: 0.26 10*3/uL — ABNORMAL HIGH (ref 0.00–0.07)
Basophils Absolute: 0 10*3/uL (ref 0.0–0.1)
Basophils Relative: 0 %
Eosinophils Absolute: 0 10*3/uL (ref 0.0–0.5)
Eosinophils Relative: 0 %
HCT: 25.8 % — ABNORMAL LOW (ref 36.0–46.0)
Hemoglobin: 8.3 g/dL — ABNORMAL LOW (ref 12.0–15.0)
Immature Granulocytes: 1 %
Lymphocytes Relative: 4 %
Lymphs Abs: 1 10*3/uL (ref 0.7–4.0)
MCH: 28.6 pg (ref 26.0–34.0)
MCHC: 32.2 g/dL (ref 30.0–36.0)
MCV: 89 fL (ref 80.0–100.0)
Monocytes Absolute: 0.6 10*3/uL (ref 0.1–1.0)
Monocytes Relative: 2 %
Neutro Abs: 22.6 10*3/uL — ABNORMAL HIGH (ref 1.7–7.7)
Neutrophils Relative %: 93 %
Platelets: 188 10*3/uL (ref 150–400)
RBC: 2.9 MIL/uL — ABNORMAL LOW (ref 3.87–5.11)
RDW: 16 % — ABNORMAL HIGH (ref 11.5–15.5)
WBC: 24.5 10*3/uL — ABNORMAL HIGH (ref 4.0–10.5)
nRBC: 0.1 % (ref 0.0–0.2)

## 2021-08-04 LAB — BASIC METABOLIC PANEL
Anion gap: 11 (ref 5–15)
BUN: 32 mg/dL — ABNORMAL HIGH (ref 6–20)
CO2: 21 mmol/L — ABNORMAL LOW (ref 22–32)
Calcium: 8 mg/dL — ABNORMAL LOW (ref 8.9–10.3)
Chloride: 108 mmol/L (ref 98–111)
Creatinine, Ser: 1.42 mg/dL — ABNORMAL HIGH (ref 0.44–1.00)
GFR, Estimated: 42 mL/min — ABNORMAL LOW (ref >60)
Glucose, Bld: 137 mg/dL — ABNORMAL HIGH (ref 70–99)
Potassium: 3 mmol/L — ABNORMAL LOW (ref 3.5–5.1)
Sodium: 140 mmol/L (ref 135–145)

## 2021-08-04 LAB — LEGIONELLA PNEUMOPHILA SEROGP 1 UR AG: L. pneumophila Serogp 1 Ur Ag: NEGATIVE

## 2021-08-04 LAB — MAGNESIUM: Magnesium: 1.1 mg/dL — ABNORMAL LOW (ref 1.7–2.4)

## 2021-08-04 LAB — PHOSPHORUS: Phosphorus: 3.8 mg/dL (ref 2.5–4.6)

## 2021-08-04 LAB — GLUCOSE, CAPILLARY
Glucose-Capillary: 101 mg/dL — ABNORMAL HIGH (ref 70–99)
Glucose-Capillary: 92 mg/dL (ref 70–99)
Glucose-Capillary: 111 mg/dL — ABNORMAL HIGH (ref 70–99)
Glucose-Capillary: 120 mg/dL — ABNORMAL HIGH (ref 70–99)
Glucose-Capillary: 135 mg/dL — ABNORMAL HIGH (ref 70–99)
Glucose-Capillary: 110 mg/dL — ABNORMAL HIGH (ref 70–99)

## 2021-08-04 LAB — PROTIME-INR
INR: 5.3 (ref 0.8–1.2)
Prothrombin Time: 48.3 seconds — ABNORMAL HIGH (ref 11.4–15.2)

## 2021-08-04 MED ORDER — POTASSIUM CHLORIDE 10 MEQ/100ML IV SOLN
10.0000 meq | INTRAVENOUS | Status: AC
  Administered 2021-08-04 (×4): 10 meq via INTRAVENOUS
  Filled 2021-08-04 (×4): qty 100

## 2021-08-04 MED ORDER — LIDOCAINE HCL (PF) 1 % IJ SOLN
INTRAMUSCULAR | Status: DC | PRN
  Administered 2021-08-04: 10 mL

## 2021-08-04 MED ORDER — IPRATROPIUM-ALBUTEROL 0.5-2.5 (3) MG/3ML IN SOLN
3.0000 mL | Freq: Four times a day (QID) | RESPIRATORY_TRACT | Status: DC
  Administered 2021-08-04: 3 mL via RESPIRATORY_TRACT
  Filled 2021-08-04 (×2): qty 3

## 2021-08-04 MED ORDER — WARFARIN SODIUM 4 MG PO TABS
4.0000 mg | ORAL_TABLET | Freq: Once | ORAL | Status: DC
  Filled 2021-08-04: qty 1

## 2021-08-04 MED ORDER — LIDOCAINE HCL 1 % IJ SOLN
INTRAMUSCULAR | Status: AC
  Filled 2021-08-04: qty 20

## 2021-08-04 MED ORDER — ONDANSETRON HCL 4 MG/2ML IJ SOLN
4.0000 mg | Freq: Once | INTRAMUSCULAR | Status: AC
  Administered 2021-08-04: 4 mg via INTRAVENOUS
  Filled 2021-08-04: qty 2

## 2021-08-04 MED ORDER — ALBUMIN HUMAN 5 % IV SOLN
25.0000 g | Freq: Once | INTRAVENOUS | Status: AC
  Administered 2021-08-04: 25 g via INTRAVENOUS
  Filled 2021-08-04: qty 500

## 2021-08-04 MED ORDER — WARFARIN - PHARMACIST DOSING INPATIENT: Freq: Every day | Status: DC

## 2021-08-04 MED ORDER — HYDROCORTISONE SOD SUC (PF) 100 MG IJ SOLR
50.0000 mg | Freq: Two times a day (BID) | INTRAMUSCULAR | Status: AC
  Administered 2021-08-05 – 2021-08-06 (×3): 50 mg via INTRAVENOUS
  Filled 2021-08-04 (×3): qty 2

## 2021-08-04 MED ORDER — LACTATED RINGERS IV BOLUS
1000.0000 mL | Freq: Once | INTRAVENOUS | Status: AC
  Administered 2021-08-04: 1000 mL via INTRAVENOUS

## 2021-08-04 MED ORDER — MAGNESIUM SULFATE 4 GM/100ML IV SOLN
4.0000 g | Freq: Once | INTRAVENOUS | Status: AC
  Administered 2021-08-04: 4 g via INTRAVENOUS
  Filled 2021-08-04: qty 100

## 2021-08-04 NOTE — Plan of Care (Signed)

## 2021-08-04 NOTE — Progress Notes (Signed)
ABG ordered for patient however RT had 2 attempts and was unable to obtain sample.  MD made aware.  Patient also taken off of bipap and placed on 4L [place]. Will continue to monitor

## 2021-08-04 NOTE — Progress Notes (Signed)
Patient states she is ok accepting albumin . Verbal concert obtained

## 2021-08-04 NOTE — H&P (Signed)
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  She was intubated and placed on vancomycin and Zosyn.  She was converted to stress dose steroids.  Tacrolimus was briefly held but has been resumed.  She has been extubated to nasal cannula.  Critical care services noted oliguria with 0.4 L of urine output yesterday, much less thus far today.  She had a creatinine of 0.9 on 07/01/21 when evaluated at the outpatient transplant clinic.  Yesterday her creatinine is increased to 1.3, today's value is pending.  She is currently awake, alert, interactive but not verbal.  She has a Foley catheter with very little urine present.  She was in shock at the time of presentation and did require vasopressors but is subsequently weaned off.  No contrast exposure.  Creatinine, Ser (mg/dL)  Date Value  08/03/2021 1.27 (H)  08/02/2021 1.07 (H)  05/29/2021 1.46 (H)  05/28/2021 1.60 (H)  05/27/2021 1.89 (H)  05/26/2021 2.30 (H)  05/26/2021 2.14 (H)  02/16/2021 0.55  02/11/2021 0.79  02/10/2021 0.70  ] I/Os: I/O last 3 completed shifts: In: 2986 [I.V.:2522.6; NG/GT:190; IV Piggyback:273.4] Out: 600 [Urine:600]   ROS Balance of 12 systems is negative w/ exceptions as above  PMH  Past Medical History:  Diagnosis Date   Anxiety    Candida esophagitis ([place]) 11/12/2014   CEREBROVASCULAR ACCIDENT, ACUTE 04/15/2010   CLOSTRIDIUM DIFFICILE COLITIS, HX OF 08/21/2007   CONGESTIVE HEART FAILURE  08/21/2007   Current use of long term anticoagulation    Dr. Andree Elk, [facility]   CVA 04/17/2010   Depression    Dr. Andree Elk, [place], TYPE II 08/21/2007   DVT, HX OF 08/21/2007   GERD 08/21/2007   GOUT 08/21/2007   History of stroke with residual effects    HYPERLIPIDEMIA 08/21/2007   HYPERTENSION 08/21/2007   Dr. Andree Elk, [place], HX OF 08/22/2007   s/p renal transplant-Dr. Bonney Leitz   LUPUS 08/21/2007   OSTEOPOROSIS 08/21/2007   [facility] at [facility]   Pulmonary embolism ([place]) 07/16/2010   Renal failure    RENAL INSUFFICIENCY 08/21/2007   Right sided weakness    Steroid-induced hyperglycemia 11/09/2014   Tachycardia    THYROID NODULE, LEFT 04/10/2009   PSH  Past Surgical History:  Procedure Laterality Date   BIOPSY  02/04/2021   Procedure: BIOPSY;  Surgeon: [name], MD;  [facility];  Service: Endoscopy;;   CESAREAN SECTION     CHOLECYSTECTOMY     COLONOSCOPY  06/2000   [facility]    ENTEROSCOPY N/A 11/11/2014   Procedure: ENTEROSCOPY;  Surgeon: Ladene Artist, MD;  [facility];  Service: Endoscopy;  Laterality: N/A;   FLEXIBLE SIGMOIDOSCOPY N/A 02/04/2021   Procedure: FLEXIBLE SIGMOIDOSCOPY;  Surgeon: [name], MD;  [facility];  Service: Endoscopy;  Laterality: N/A;   KIDNEY TRANSPLANT Right 2009   RENAL BIOPSY, OPEN  1981   TUBAL LIGATION     FH  Family History  Problem Relation Age of Onset  Heart attack Mother    Heart disease Father    Asthma Sister    Asthma Daughter    Cancer Maternal Grandfather        prostate   Cancer Paternal Grandfather        colon   SH  reports that she has never smoked. She has never used smokeless tobacco. She reports that she does not drink alcohol and does not use drugs. Allergies  Allergies  Allergen Reactions   Oxycodone-Acetaminophen Shortness Of Breath and Nausea Only   Propoxyphene Nausea Only and Shortness Of Breath    States takes tylenol at  home   Sulfonamide Derivatives Shortness Of Breath and Nausea Only   Codeine Nausea Only   Hydrocodone-Acetaminophen     unknown   Hydromorphone     On MAR   Other Other (See Comments)    No blood, Jehovaeh Witness    Sulfamethoxazole    Tape     Redness**PAPER TAPE OK**   Gabapentin Anxiety    twitching   Latex Rash   Metoprolol Rash   Morphine And Related Rash    IV site on arm is red, patient reports this is improving.  NO shortness of breath reported. Patient is given dose of Benadryl when taking Morphine    Rosiglitazone Rash   Home medications Prior to Admission medications   Medication Sig Start Date End Date Taking? Authorizing Provider  acetaminophen (TYLENOL) 500 MG tablet Take 1,000 mg by mouth every 6 (six) hours as needed for moderate pain.   Yes [provider]  alendronate (FOSAMAX) 70 MG tablet Take 70 mg by mouth every Saturday.   Yes [provider]  Amino Acids-Protein Hydrolys (FEEDING SUPPLEMENT, PRO-STAT SUGAR FREE 64,) LIQD Take 30 mLs by mouth in the morning and at bedtime.   Yes [provider]  amitriptyline (ELAVIL) 10 MG tablet Take 10 mg by mouth at bedtime.   Yes [provider]  calcitRIOL (ROCALTROL) 0.25 MCG capsule TAKE 1 CAPSULE(0.25 MCG) BY MOUTH DAILY. NO REFILLS WITHOUT APPOINTMENT Patient taking differently: Take 0.25 mcg by mouth daily. 02/13/19  Yes Renato Shin, MD  carvedilol (COREG) 6.25 MG tablet Take 1 tablet (6.25 mg total) by mouth 2 (two) times daily with a meal. 02/13/21  Yes Little Ishikawa, MD  cholecalciferol (VITAMIN D3) 25 MCG (1000 UNIT) tablet Take 1,000 Units by mouth daily.   Yes [provider]  cholestyramine (QUESTRAN) 4 g packet Take 1 packet (4 g total) by mouth daily. 05/29/21 09/12/21 Yes Amin, Jeanella Flattery, MD  dicyclomine (BENTYL) 10 MG capsule Take 1 capsule (10 mg total) by mouth 3 (three) times daily before meals. 02/13/21  Yes Little Ishikawa, MD  diltiazem [facility])  360 MG 24 hr capsule Take 1 capsule (360 mg total) by mouth daily. 06/27/19  Yes [name], [name], MD  donepezil (ARICEPT) 10 MG tablet Take 10 mg by mouth at bedtime.  11/18/16  Yes [provider]  esomeprazole (NEXIUM) 20 MG capsule TAKE 1 CAPSULE(20 MG) BY MOUTH DAILY Patient taking differently: Take 20 mg by mouth daily at 12 noon. 07/17/18  Yes Renato Shin, MD  famotidine (PEPCID) 20 MG tablet Take 20 mg by mouth 2 (two) times daily.   Yes [provider]  FLUoxetine (PROZAC) 20 MG capsule Take 20 mg by mouth daily.   Yes [provider]  furosemide (LASIX) 20 MG tablet Take 20 mg by mouth daily.   Yes [provider]  hydrocortisone (  ANUSOL-HC) 25 MG suppository Place 1 suppository (25 mg total) rectally at bedtime. 05/29/21  Yes Amin, Ankit Chirag, MD  insulin lispro (HUMALOG) 100 UNIT/ML injection Inject 0-12 Units into the skin 3 (three) times daily before meals. Per Sliding Scale. If blood sugar is: 0-150: 0 units 151-200: 2 units 201-250: 4 units 251-300: 6 units 301-350: 8 units 351-400: 10 units 401-450: 12 units   Yes [provider]  lactose free nutrition (BOOST) LIQD Take 237 mLs by mouth in the morning and at bedtime.   Yes [provider]  levothyroxine (SYNTHROID) 150 MCG tablet Take 1 tablet (150 mcg total) by mouth daily at 6 (six) AM. Patient taking differently: Take 150 mcg by mouth daily before breakfast. 02/14/21  Yes Little Ishikawa, MD  loperamide (IMODIUM) 2 MG capsule Take 2 mg by mouth every 6 (six) hours as needed for diarrhea or loose stools.   Yes [provider]  magnesium chloride (SLOW-MAG) 64 MG TBEC SR tablet Take 1 tablet by mouth daily.   Yes [provider]  mirtazapine (REMERON) 30 MG tablet Take 1 tablet (30 mg total) by mouth at bedtime. 07/28/15  Yes Jaffe, Adam R, DO  morphine (MSIR) 15 MG tablet Take 15 mg by mouth every 4 (four) hours as needed (pain).   Yes [provider]  Multiple Vitamin (MULTIVITAMIN) tablet Take 1 tablet by mouth daily.   Yes [provider]  ondansetron (ZOFRAN ODT) 4 MG disintegrating tablet 4mg  ODT q4 hours prn nausea/vomiting Patient taking differently: Take 4 mg by mouth every 4 (four) hours as needed for nausea or vomiting. 06/15/15  Yes Mesner, Corene Cornea, MD  potassium chloride SA (KLOR-CON M) 20 MEQ tablet Take 20 mEq by mouth daily.   Yes [provider]  predniSONE (DELTASONE) 5 MG tablet Take 1 tablet (5 mg total) by mouth daily with breakfast. Patient taking differently: Take 5 mg by mouth daily. Continuous 05/29/21  Yes Amin, Ankit Chirag, MD  PSYLLIUM PO Take 11.8 g by mouth daily. Reguloid 3g/5.4g   Yes [provider]  Skin Protectants, Misc. (EUCERIN) cream Apply 1 application topically every evening. To feet   Yes [provider]  tacrolimus (PROGRAF) 0.5 MG capsule Take 0.5 mg by mouth See admin instructions. 0.5mg  every 12 hours along with Tacrolimus 2mg  dose for a total dose of 2.5mg  every 12 hours. 04/17/21  Yes [provider]  tacrolimus (PROGRAF) 1 MG capsule Take 2 mg by mouth See admin instructions. 2mg  every 12 hours along with Tacrolimus 0.5mg  dose for a total dose of 2.5mg  every 12 hours. 01/15/18  Yes [provider]  warfarin (COUMADIN) 3 MG tablet Take 3.5 mg by mouth every other day. Alternate with 4mg    Yes [provider]  warfarin (COUMADIN) 4 MG tablet Take 4 mg by mouth every other day. Alternate with 3.5mg    Yes [provider]  nystatin (MYCOSTATIN/NYSTOP) powder Apply topically 2 (two) times daily. Patient not taking: Reported on 08/02/2021 05/29/21   Damita Lack, MD    Current Medications Scheduled Meds:  chlorhexidine  15 mL Mouth Rinse BID   Chlorhexidine Gluconate Cloth  6 each Topical Daily   docusate sodium  100 mg Oral BID   hydrocortisone sod succinate (SOLU-CORTEF) inj  100 mg Intravenous Q12H   insulin  aspart  0-9 Units Subcutaneous Q4H   ipratropium-albuterol  3 mL Nebulization Q4H   levothyroxine  150 mcg Oral Q0600   mouth rinse  15  mL Mouth Rinse q12n4p   pantoprazole  40 mg Oral Daily   polyethylene glycol  17 g Oral Daily   Racepinephrine HCl  0.5 mL Nebulization Once   sodium chloride flush  3 mL Intravenous Q12H   tacrolimus  1 mg Sublingual BID   warfarin  4 mg Oral ONCE-1600   Warfarin - Pharmacist Dosing Inpatient   Does not apply q1600   Continuous Infusions:  sodium chloride 10 mL/hr at 08/04/21 1400   heparin Stopped (08/04/21 1042)   norepinephrine (LEVOPHED) Adult infusion 1 mcg/min (08/04/21 1400)   piperacillin-tazobactam (ZOSYN)  IV 12.5 mL/hr at 08/04/21 1400   PRN Meds:.acetaminophen **OR** acetaminophen, lidocaine (PF)  CBC Recent Labs  Lab 08/02/21 0511 08/02/21 1515 08/03/21 0135 08/03/21 0428  WBC 10.4  --  24.2*  --   NEUTROABS 6.6  --   --   --   HGB 11.0* 8.8* 12.1 9.9*  HCT 37.1 26.0* 40.5 29.0*  MCV 97.4  --  94.8  --   PLT 166  --  157  --    Basic Metabolic Panel Recent Labs  Lab 08/02/21 0511 08/02/21 1515 08/03/21 0428 08/03/21 1213  NA 140 141 140 143  K 4.3 3.3* 3.7 5.3*  CL 107  --   --  115*  CO2 20*  --   --  16*  GLUCOSE 68*  --   --  152*  BUN 24*  --   --  29*  CREATININE 1.07*  --   --  1.27*  CALCIUM 8.0*  --   --  7.8*    Physical Exam   Blood pressure (!) 126/59, pulse (!) 112, temperature 98.1 F (36.7 C), temperature source Oral, resp. rate 14, height 5' (1.524 m), weight 79.6 kg, SpO2 99 %. GEN: Chronically ill-appearing, in bed ENT: NCAT EYES: Sclera clear CV: Tacky, regular PULM: Coarse breath sounds bilaterally ABD: Soft, nontender SKIN: No rashes or lesions EXT: No peripheral edema NEURO: Aphasic, right-sided paresis GU: Foley present  Assessment 41F s/p ddKT 2005 [facility] Tac/Pred with aspiration PNA, septic shock resolving, VDRF. Has oliguria  Oliguria, probable AKI: Likely ATN from acute  presentation with shock and hypoxic respiratory failure.  Rejection unlikely S/p dd KT 2005 [facility], Tac/Pred, BL SCr < 1 Aspiration PNA: per CCM, Zosyn Shock, septick Weening NE, per CCM Hx/o CVA with R sided paresis + aphasia DM2 post KT Hx/o SLE VDRF, improved, extubated  Plan Cont supportive care at this time Await PM Labs Hopefully this will not be a severe renal injury Daily weights, Daily Renal Panel, Strict I/Os, Avoid nephrotoxins (NSAIDs, judicious IV Contrast)    Connie Ruiz  08/04/2021, 3:06 PM

## 2021-08-04 NOTE — Progress Notes (Signed)
SLP Cancellation Note  Patient Details Name: Connie Ruiz MRN: [id number] DOB: 08-26-1960   Cancelled treatment:       Reason Eval/Treat Not Completed: Medical issues which prohibited therapy (Patient on Bipap. Will f/u.)  Gabriel Rainwater MA, CCC-SLP  Connie Ruiz 08/04/2021, 10:28 AM

## 2021-08-04 NOTE — Evaluation (Signed)
Clinical/Bedside Swallow Evaluation Patient Details  Name: Connie Ruiz MRN: [id number] Date of Birth: [birth year]  Today's Date: 08/04/2021 Time: SLP Start Time (ACUTE ONLY): 4818 SLP Stop Time (ACUTE ONLY): 5631 SLP Time Calculation (min) (ACUTE ONLY): 20 min  Past Medical History:  Past Medical History:  Diagnosis Date   Anxiety    Candida esophagitis ([place]) 11/12/2014   CEREBROVASCULAR ACCIDENT, ACUTE 04/15/2010   CLOSTRIDIUM DIFFICILE COLITIS, HX OF 08/21/2007   CONGESTIVE HEART FAILURE 08/21/2007   Current use of long term anticoagulation    Dr. Andree Elk, [facility]   CVA 04/17/2010   Depression    Dr. Andree Elk, [place], TYPE II 08/21/2007   DVT, HX OF 08/21/2007   GERD 08/21/2007   GOUT 08/21/2007   History of stroke with residual effects    HYPERLIPIDEMIA 08/21/2007   HYPERTENSION 08/21/2007   Dr. Andree Elk, [place], HX OF 08/22/2007   s/p renal transplant-Dr. Bonney Leitz   LUPUS 08/21/2007   OSTEOPOROSIS 08/21/2007   [facility]umatol at [facility]   Pulmonary embolism ([place]) 07/16/2010   Renal failure    RENAL INSUFFICIENCY 08/21/2007   Right sided weakness    Steroid-induced hyperglycemia 11/09/2014   Tachycardia    THYROID NODULE, LEFT 04/10/2009   Past Surgical History:  Past Surgical History:  Procedure Laterality Date   BIOPSY  02/04/2021   Procedure: BIOPSY;  Surgeon: [name], MD;  [facility];  Service: Endoscopy;;   CESAREAN SECTION     CHOLECYSTECTOMY     COLONOSCOPY  06/2000   [facility]    ENTEROSCOPY N/A 11/11/2014   Procedure: ENTEROSCOPY;  Surgeon: Ladene Artist, MD;  [facility];  Service: Endoscopy;  Laterality: N/A;   FLEXIBLE SIGMOIDOSCOPY N/A 02/04/2021   Procedure: FLEXIBLE SIGMOIDOSCOPY;  Surgeon: [name], MD;  [facility];  Service: Endoscopy;  Laterality: N/A;   KIDNEY TRANSPLANT Right 2009   RENAL BIOPSY, OPEN  1981   TUBAL LIGATION     HPI:  Patient is a 61 y.o. female with PMH:  renal transplant 2005, CVA with chronic right sided paresis and aphasia, DM-2, shingles, gout, GI bleed 05/2021, chronic SNF resident. She was admitted on 2/26 with fever, hypoxia and found o have aspiration PNA. She was intubated and started on vancomycin and zosyn. She was extubated on 12/27, but showed increased work of breathing and was put on BiPAP. She was taken off BiPAP late AM 2/28. SLP initially tried ot evaluate patient this AM but at that time, patient was in respiratory distress with oxygen saturations in upper 70's and marked thick yellow-green secretions. She continues to be NPO.    Assessment / Plan / Recommendation  Clinical Impression  Patient presents with clinical s/s of dysphagia as per this bedside/clinical swallow evaluation. Patient responded to immediate needs questions with yes/no head nod/shake and did appear to be consistent and intentional with responses. She is completely non-verbal and did not make any vocalizations. After oral care, SLP asked patient to swallow and when she did she winced and when asked if it hurt she nodded yes. SLP asked her if she would be willing to try ice, water, applesauce and she shook her head each time. SLP then presented a toothette sponge soaked with water which patient seemed to tolerate. She initiated some swallows and after doing so she was then willing to try spoon sips of water. Although SLP suspects delayed swallow initiation, no coughing or throat clearing observed. After two spoon sips  of water patient shook head and when SLP asked if she wanted to be done, she nodded yes. SLP notified RN of recommendations for necessary medications only crushed in puree, spoon sips water PRN. SLP will f/u next date with plan for PO trials. SLP Visit Diagnosis: Dysphagia, unspecified (R13.10)    Aspiration Risk  Moderate aspiration risk;Risk for inadequate nutrition/hydration    Diet Recommendation NPO;Other (Comment) (PRN teaspoon sips water, necessary  medications crushed in puree)   Liquid Administration via: Spoon Medication Administration: Crushed with puree Supervision: Staff to assist with self feeding;Full supervision/cueing for compensatory strategies    Other  Recommendations Oral Care Recommendations: Oral care BID;Staff/trained caregiver to provide oral care    Recommendations for follow up therapy are one component of a multi-disciplinary discharge planning process, led by the attending physician.  Recommendations may be updated based on patient status, additional functional criteria and insurance authorization.  Follow up Recommendations Skilled nursing-short term rehab (<3 hours/day)      Assistance Recommended at Discharge Frequent or constant Supervision/Assistance  Functional Status Assessment Patient has had a recent decline in their functional status and demonstrates the ability to make significant improvements in function in a reasonable and predictable amount of time.  Frequency and Duration min 2x/week  1 week       Prognosis Prognosis for Safe Diet Advancement: Fair Barriers to Reach Goals: Time post onset      Swallow Study   General Date of Onset: 08/02/21 HPI: Patient is a 61 y.o. 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 She was admitted on 2/26 with fever, hypoxia and found o have aspiration PNA. She was intubated and started on vancomycin and zosyn. She was extubated on 12/27, but showed increased work of breathing and was put on BiPAP. She was taken off BiPAP late AM 2/28. SLP initially tried ot evaluate patient this AM but at that time, patient was in respiratory distress with oxygen saturations in upper 70's and marked thick yellow-green secretions. She continues to be NPO. Type of Study: Bedside Swallow Evaluation Previous Swallow Assessment: bedside swallow evals complete in 2015 and 2021 both  recommending dysphagia 3 with thin liquid Diet Prior to this Study: NPO Temperature Spikes Noted: Yes Respiratory Status: Nasal cannula History of Recent Intubation: Yes Length of Intubations (days): 2 days Date extubated: 08/03/21 Behavior/Cognition: Alert;Cooperative Oral Care Completed by SLP: Yes Oral Cavity - Dentition: Adequate natural dentition Self-Feeding Abilities: Total assist Patient Positioning: Upright in bed Baseline Vocal Quality: Not observed Volitional Cough: Cognitively unable to elicit Volitional Swallow: Able to elicit    Oral/Motor/Sensory Function Overall Oral Motor/Sensory Function: Generalized oral weakness   Ice Chips     Thin Liquid Thin Liquid: Impaired Presentation: Spoon Pharyngeal  Phase Impairments: Suspected delayed Swallow    Nectar Thick Nectar Thick Liquid: Not tested   Honey Thick Honey Thick Liquid: Not tested   Puree Puree: Not tested   Solid     Solid: Not tested      Sonia Baller, MA, CCC-SLP Speech Therapy

## 2021-08-04 NOTE — Progress Notes (Signed)
PT Cancellation Note  Patient Details Name: Connie Ruiz MRN: [id number] DOB: [birth year]   Cancelled Treatment:    Reason Eval/Treat Not Completed: PT screened, no needs identified, will sign off. Pt is long term care patient at nursing facility. Pt bedbound at facility or mechanical lift. No role for acute PT.    Shary Decamp [facility] 08/04/2021, 8:21 AM Carrieann Spielberg [place] Pager [phone] Office [phone]

## 2021-08-04 NOTE — Procedures (Signed)
Bedside chest ultrasound:  Showing right-sided dense consolidation with minimal pleural effusion, on left side some atelectasis   Rt Side     Lt side:

## 2021-08-04 NOTE — Progress Notes (Signed)
ANTICOAGULATION CONSULT NOTE - Follow Up Consult  Pharmacy Consult for Heparin drip Indication: history of pulmonary embolus  Allergies  Allergen Reactions   Oxycodone-Acetaminophen Shortness Of Breath and Nausea Only   Propoxyphene Nausea Only and Shortness Of Breath    States takes tylenol at home   Sulfonamide Derivatives Shortness Of Breath and Nausea Only   Codeine Nausea Only   Hydrocodone-Acetaminophen     unknown   Hydromorphone     On MAR   Other Other (See Comments)    No blood, Jehovaeh Witness    Sulfamethoxazole    Tape     Redness**PAPER TAPE OK**   Gabapentin Anxiety    twitching   Latex Rash   Metoprolol Rash   Morphine And Related Rash    IV site on arm is red, patient reports this is improving.  NO shortness of breath reported. Patient is given dose of Benadryl when taking Morphine    Rosiglitazone Rash    Patient Measurements: Height: 5' (152.4 cm) Weight: 79.6 kg (175 lb 7.8 oz) IBW/kg (Calculated) : 45.5 Heparin Dosing Weight: 63.7 kg  Vital Signs: Temp: 97.9 F (36.6 C) (02/28 1517) Temp Source: Oral (02/28 1517) BP: 110/68 (02/28 1500) Pulse Rate: 103 (02/28 1517)  Labs: Recent Labs    08/02/21 0511 08/02/21 1515 08/03/21 0135 08/03/21 0428 08/03/21 1213 08/03/21 1657 08/04/21 1425  HGB 11.0*   < > 12.1 9.9*  --   --  8.3*  HCT 37.1   < > 40.5 29.0*  --   --  25.8*  PLT 166  --  157  --   --   --  188  APTT 58*  --   --   --   --   --   --   LABPROT 48.7*  --  20.9*  --   --   --  48.3*  INR 5.3*  --  1.8*  --   --   --  5.3*  HEPARINUNFRC  --   --   --   --   --  0.30  --   CREATININE 1.07*  --   --   --  1.27*  --  1.42*   < > = values in this interval not displayed.     Estimated Creatinine Clearance: 39.3 mL/min (A) (by C-G formula based on SCr of 1.42 mg/dL (H)).    Assessment: 61 yo female from [facility] concern for aspiration pneumonia. On warfarin PTA with 3.5mg  and 4mg  alternating.   INR 5.3 on admit. Vitamin K  5 mg IV given 2/26 2/27 INR down to 1.8 so heparin gtt started 2/28 Per CCM MD, ok to restart warfarin if pt passes swallow eval. Swallow eval not done yet. STAT INR ordered which resulted at 5.3. D/w Dr. Tacy Learn, will d/c heparin gtt for now and restart when INR < 2.  Goal of Therapy:  INR 2-3 Heparin level 0.3-0.7 units/ml Monitor platelets by anticoagulation protocol: Yes   Plan:  Daily INR Restart heparin gtt when INR < 2 versus just restart coumadin if passes swallow eval  Thank you for allowing pharmacy to be a part of this patients care.  Sherlon Handing, PharmD, BCPS Please see amion for complete clinical pharmacist phone list 08/04/2021 5:27 PM

## 2021-08-04 NOTE — Procedures (Addendum)
Brachial vein double lumen PICC placed without immediate complications. Length 30 cm tip SVC / right atrial junction. Okay to use. Medication used - 1& lidocaine and subcutaneous tissues. EBL < 2 ml.

## 2021-08-04 NOTE — Progress Notes (Signed)
PICC order received. Secure chat primary RN Ihor Dow and Brynda Rim MD that order PICC made aware that patient need to refer to IR to place PICC.

## 2021-08-04 NOTE — Progress Notes (Signed)
NAME:  Connie Ruiz, MRN:  [id number], DOB:  1961-01-15, LOS: 2 ADMISSION DATE:  08/02/2021, CONSULTATION DATE:  08/02/2021 REFERRING MD:  Dr. Lorin Mercy, [facility], CHIEF COMPLAINT:  Gurgling respirations   History of Present Illness:  60 yo female from [facility] with fever 101.3.  SpO2 in 70s on room air.  Noted to have gurgling respirations and chest xray showed findings of aspiration pneumonia.  She has prior CVA and is non verbal.   She was not able to protect airway and required intubation.  She has hx of renal disease and previously on dialysis for 5 years.  She failed access multiple times and was transitioned to peritoneal dialysis.  She had renal transplant in November 2005.  She is followed by nephrology at [place].  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, SLE, s/p renal transplant, PE on coumadin, [name], Herpes zoster, Gout, HOCM, Enterococcal endocarditis, GERD, Urinary incontinence, Rectal bleeding  Significant Hospital Events: Including procedures, antibiotic start and stop dates in addition to other pertinent events   2/26 Admit, intubated, start antibiotics, PICC line  2/27 On vent, remains on Levophed 75mcg, Vanc, Cefepime  2/28 Extubated, did ok overnight, but less awake this morning and minimal UOP, lab unable to draw, on bipap  Interim History / Subjective:  Initially more somnolent this AM, lab unable to draw and IV team unable to get PICC.    Objective   Blood pressure 125/89, pulse (!) 106, temperature 98.4 F (36.9 C), temperature source Axillary, resp. rate 17, height 5' (1.524 m), weight 79.6 kg, SpO2 97 %.    Vent Mode: BIPAP;PCV FiO2 (%):  [40 %] 40 % Set Rate:  [12 bmp-15 bmp] 15 bmp PEEP:  [5 cmH20] 5 cmH20 Pressure Support:  [5 cmH20] 5 cmH20   Intake/Output Summary (Last 24 hours) at 08/04/2021 4696 Last data filed at 08/04/2021 2952 Gross per 24  hour  Intake 1051.58 ml  Output 400 ml  Net 651.58 ml    Filed Weights   08/03/21 0130 08/03/21 0834  Weight: 79.6 kg 79.6 kg    General:  ill-appearing elderly F in no acute distress HEENT: MM pink/moist, sclera anicteric, pupils equal Neuro: somnolent on bipap, but arousable to voice and following commands CV: s1s2 rrr, no m/r/g PULM:  decreased air movement bilateral bases with expiratory wheezing on the R GI: soft, bsx4 active  Extremities: warm/dry, 2+ edema  Skin: no rashes or lesions   Labs pending for today  Resolved Hospital Problem list     Assessment & Plan:   Acute hypoxic respiratory failure with compromised airway in setting of aspiration pneumonia, septic shock Extubated yesterday, respiratory and mental status slightly tenuous  -CXR with persistent LLL atelectasis vs consolidation and bedside POCUS with dense RLL consolidation -MRSA negative, cultures negative thus far continue Zosyn, follow blood and respiratory cultures to completion -Bronchial hygiene and RT/bronchodilator protocol. -down to 18mcg Levo -continue Levophed to maintain MAP >65,      Hx of SLE s/p renal transplant. - followed by nephrology at [facility] - on chronic prednisone 5mg , continue stress dose steroids - d/w pharmacy and will resume prograff  -decreased UOP today with BMP pending, will consult nephrology  Hx of PE on coumadin with elevated  INR. - Coumadin held and received  Vit K on 2/26 - INR 1.8, unable to check coags so holding heparin until we  get labs, pt is a [name]   Acute metabolic encephalopathy from hypoxia. Hx of CVA with aphasia and Rt side weakness. -improved yesterday, more somnolent today, check ABG while waiting on labs   Best Practice (right click and "Reselect all SmartList Selections" daily)   Diet/type: NPO, not yet passed swallow screen  DVT prophylaxis: SCD GI prophylaxis: PPI Lines: N/A Foley:  Yes, and it is still needed Code Status:   full code Last date of multidisciplinary goals of care discussion [family updated 2/27 at the bedside, pending for today]  Labs   CBC: Recent Labs  Lab 08/02/21 0511 08/02/21 1515 08/03/21 0135 08/03/21 0428  WBC 10.4  --  24.2*  --   NEUTROABS 6.6  --   --   --   HGB 11.0* 8.8* 12.1 9.9*  HCT 37.1 26.0* 40.5 29.0*  MCV 97.4  --  94.8  --   PLT 166  --  157  --      Basic Metabolic Panel: Recent Labs  Lab 08/02/21 0511 08/02/21 1515 08/03/21 0428 08/03/21 1213  NA 140 141 140 143  K 4.3 3.3* 3.7 5.3*  CL 107  --   --  115*  CO2 20*  --   --  16*  GLUCOSE 68*  --   --  152*  BUN 24*  --   --  29*  CREATININE 1.07*  --   --  1.27*  CALCIUM 8.0*  --   --  7.8*    GFR: Estimated Creatinine Clearance: 44 mL/min (A) (by C-G formula based on SCr of 1.27 mg/dL (H)). Recent Labs  Lab 08/02/21 0511 08/03/21 0135  WBC 10.4 24.2*  LATICACIDVEN 0.8  --      Liver Function Tests: Recent Labs  Lab 08/02/21 0511  AST 17  ALT 10  ALKPHOS 48  BILITOT 0.9  PROT 5.1*  ALBUMIN 2.2*    No results for input(s): LIPASE, AMYLASE in the last 168 hours. No results for input(s): AMMONIA in the last 168 hours.  ABG    Component Value Date/Time   PHART 7.242 (L) 08/03/2021 0428   PCO2ART 44.5 08/03/2021 0428   PO2ART 100 08/03/2021 0428   HCO3 19.2 (L) 08/03/2021 0428   TCO2 21 (L) 08/03/2021 0428   ACIDBASEDEF 8.0 (H) 08/03/2021 0428   O2SAT 97 08/03/2021 0428      Coagulation Profile: Recent Labs  Lab 08/02/21 0511 08/03/21 0135  INR 5.3* 1.8*     Cardiac Enzymes: No results for input(s): CKTOTAL, CKMB, CKMBINDEX, TROPONINI in the last 168 hours.  HbA1C: Hgb A1c MFr Bld  Date/Time Value Ref Range Status  05/27/2021 06:20 AM 6.0 (H) 4.8 - 5.6 % Final    Comment:    (NOTE) Pre diabetes:          5.7%-6.4%  Diabetes:              >6.4%  Glycemic control for   <7.0% adults with diabetes   01/27/2021 10:09 AM 8.2 (H) 4.8 - 5.6 % Final    Comment:     (NOTE) Pre diabetes:          5.7%-6.4%  Diabetes:              >6.4%  Glycemic control for   <7.0% adults with diabetes     CBG: Recent Labs  Lab 08/03/21 1510 08/03/21 1946 08/03/21 2323 08/04/21 0335 08/04/21 0733  GLUCAP 152* 78 84 101* 92  Review of Systems:   Unable to obtain  Past Medical History:  She,  has a past medical history of Anxiety, Candida esophagitis ([place]) (11/12/2014), CEREBROVASCULAR ACCIDENT, ACUTE (04/15/2010), CLOSTRIDIUM DIFFICILE COLITIS, HX OF (08/21/2007), CONGESTIVE HEART FAILURE (08/21/2007), Current use of long term anticoagulation, CVA (04/17/2010), Depression, DIABETES MELLITUS, TYPE II (08/21/2007), DVT, HX OF (08/21/2007), GERD (08/21/2007), GOUT (08/21/2007), History of stroke with residual effects, HYPERLIPIDEMIA (08/21/2007), HYPERTENSION (08/21/2007), KIDNEY TRANSPLANTATION, HX OF (08/22/2007), LUPUS (08/21/2007), OSTEOPOROSIS (08/21/2007), Pulmonary embolism ([place]) (07/16/2010), Renal failure, RENAL INSUFFICIENCY (08/21/2007), Right sided weakness, Steroid-induced hyperglycemia (11/09/2014), Tachycardia, and THYROID NODULE, 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   Surgical History:   Past Surgical History:  Procedure Laterality Date   BIOPSY  02/04/2021   Procedure: BIOPSY;  Surgeon: [name], MD;  Location: [facility];  Service: Endoscopy;;   CESAREAN SECTION     CHOLECYSTECTOMY     COLONOSCOPY  06/2000   [facility]    ENTEROSCOPY N/A 11/11/2014   Procedure: ENTEROSCOPY;  Surgeon: Ladene Artist, MD;  Location: [facility];  Service: Endoscopy;  Laterality: N/A;   FLEXIBLE SIGMOIDOSCOPY N/A 02/04/2021   Procedure: FLEXIBLE SIGMOIDOSCOPY;  Surgeon: [name], MD;  Location: [facility];  Service: Endoscopy;  Laterality: N/A;   KIDNEY TRANSPLANT Right 2009   RENAL BIOPSY, OPEN  1981   TUBAL LIGATION       Social History:   reports that she has never smoked. She has never used smokeless tobacco. She reports that she does not drink alcohol and does not use  drugs.   Family History:  Her family history includes Asthma in her daughter and sister; Cancer in her maternal grandfather and paternal grandfather; Heart attack in her mother; Heart disease in her father.   Allergies Allergies  Allergen Reactions   Oxycodone-Acetaminophen Shortness Of Breath and Nausea Only   Propoxyphene Nausea Only and Shortness Of Breath    States takes tylenol at home   Sulfonamide Derivatives Shortness Of Breath and Nausea Only   Codeine Nausea Only   Hydrocodone-Acetaminophen     unknown   Hydromorphone     On MAR   Other Other (See Comments)    No blood, Jehovaeh Witness    Sulfamethoxazole    Tape     Redness**PAPER TAPE OK**   Gabapentin Anxiety    twitching   Latex Rash   Metoprolol Rash   Morphine And Related Rash    IV site on arm is red, patient reports this is improving.  NO shortness of breath reported. Patient is given dose of Benadryl when taking Morphine    Rosiglitazone Rash     Home Medications  Prior to Admission medications   Medication Sig Start Date End Date Taking? Authorizing Provider  acetaminophen (TYLENOL) 500 MG tablet Take 1,000 mg by mouth every 6 (six) hours as needed for moderate pain.   Yes [provider]  alendronate (FOSAMAX) 70 MG tablet Take 70 mg by mouth every Saturday.   Yes [provider]  Amino Acids-Protein Hydrolys (FEEDING SUPPLEMENT, PRO-STAT SUGAR FREE 64,) LIQD Take 30 mLs by mouth in the morning and at bedtime.   Yes [provider]  amitriptyline (ELAVIL) 10 MG tablet Take 10 mg by mouth at bedtime.   Yes [provider]  calcitRIOL (ROCALTROL) 0.25 MCG capsule TAKE 1 CAPSULE(0.25 MCG) BY MOUTH DAILY. NO REFILLS WITHOUT APPOINTMENT Patient taking differently: Take 0.25 mcg by mouth daily. 02/13/19  Yes Renato Shin, MD  carvedilol (COREG) 6.25 MG tablet Take 1 tablet (6.25 mg  total) by mouth 2 (two) times daily with a meal. 02/13/21  Yes Little Ishikawa, MD   cholecalciferol (VITAMIN D3) 25 MCG (1000 UNIT) tablet Take 1,000 Units by mouth daily.   Yes [provider]  cholestyramine (QUESTRAN) 4 g packet Take 1 packet (4 g total) by mouth daily. 05/29/21 09/12/21 Yes Amin, Jeanella Flattery, MD  dicyclomine (BENTYL) 10 MG capsule Take 1 capsule (10 mg total) by mouth 3 (three) times daily before meals. 02/13/21  Yes Little Ishikawa, MD  diltiazem [facility]) 360 MG 24 hr capsule Take 1 capsule (360 mg total) by mouth daily. 06/27/19  Yes [name], [name], MD  donepezil (ARICEPT) 10 MG tablet Take 10 mg by mouth at bedtime.  11/18/16  Yes [provider]  esomeprazole (NEXIUM) 20 MG capsule TAKE 1 CAPSULE(20 MG) BY MOUTH DAILY Patient taking differently: Take 20 mg by mouth daily at 12 noon. 07/17/18  Yes Renato Shin, MD  famotidine (PEPCID) 20 MG tablet Take 20 mg by mouth 2 (two) times daily.   Yes [provider]  FLUoxetine (PROZAC) 20 MG capsule Take 20 mg by mouth daily.   Yes [provider]  furosemide (LASIX) 20 MG tablet Take 20 mg by mouth daily.   Yes [provider]  hydrocortisone (ANUSOL-HC) 25 MG suppository Place 1 suppository (25 mg total) rectally at bedtime. 05/29/21  Yes Amin, Ankit Chirag, MD  insulin lispro (HUMALOG) 100 UNIT/ML injection Inject 0-12 Units into the skin 3 (three) times daily before meals. Per Sliding Scale. If blood sugar is: 0-150: 0 units 151-200: 2 units 201-250: 4 units 251-300: 6 units 301-350: 8 units 351-400: 10 units 401-450: 12 units   Yes [provider]  lactose free nutrition (BOOST) LIQD Take 237 mLs by mouth in the morning and at bedtime.   Yes [provider]  levothyroxine (SYNTHROID) 150 MCG tablet Take 1 tablet (150 mcg total) by mouth daily at 6 (six) AM. Patient taking differently: Take 150 mcg by mouth daily before breakfast. 02/14/21  Yes Little Ishikawa, MD  loperamide (IMODIUM) 2 MG capsule Take 2 mg by mouth every 6 (six) hours  as needed for diarrhea or loose stools.   Yes [provider]  magnesium chloride (SLOW-MAG) 64 MG TBEC SR tablet Take 1 tablet by mouth daily.   Yes [provider]  mirtazapine (REMERON) 30 MG tablet Take 1 tablet (30 mg total) by mouth at bedtime. 07/28/15  Yes Jaffe, Adam R, DO  morphine (MSIR) 15 MG tablet Take 15 mg by mouth every 4 (four) hours as needed (pain).   Yes [provider]  Multiple Vitamin (MULTIVITAMIN) tablet Take 1 tablet by mouth daily.   Yes [provider]  ondansetron (ZOFRAN ODT) 4 MG disintegrating tablet 4mg  ODT q4 hours prn nausea/vomiting Patient taking differently: Take 4 mg by mouth every 4 (four) hours as needed for nausea or vomiting. 06/15/15  Yes Mesner, Corene Cornea, MD  potassium chloride SA (KLOR-CON M) 20 MEQ tablet Take 20 mEq by mouth daily.   Yes [provider]  predniSONE (DELTASONE) 5 MG tablet Take 1 tablet (5 mg total) by mouth daily with breakfast. Patient taking differently: Take 5 mg by mouth daily. Continuous 05/29/21  Yes Amin, Ankit Chirag, MD  PSYLLIUM PO Take 11.8 g by mouth daily. Reguloid 3g/5.4g   Yes [provider]  Skin Protectants, Misc. (EUCERIN) cream Apply 1 application topically every evening. To feet   Yes [provider]  tacrolimus (PROGRAF) 0.5 MG capsule Take 0.5 mg by mouth See admin instructions. 0.5mg  every 12 hours along with Tacrolimus 2mg  dose for a total dose of 2.5mg  every 12 hours. 04/17/21  Yes [provider]  tacrolimus (PROGRAF) 1 MG capsule Take 2 mg by mouth See admin instructions. 2mg  every 12 hours along with Tacrolimus 0.5mg  dose for a total dose of 2.5mg  every 12 hours. 01/15/18  Yes [provider]  nystatin (MYCOSTATIN/NYSTOP) powder Apply topically 2 (two) times daily. Patient not taking: Reported on 08/02/2021 05/29/21   Damita Lack, MD     Critical care time: 42 minutes     CRITICAL CARE Performed by: Otilio Carpen  Phu Record   Total critical care time: 42 minutes  Critical care time was exclusive of separately billable procedures and treating other patients.  Critical care was necessary to treat or prevent imminent or life-threatening deterioration.  Critical care was time spent personally by me on the following activities: development of treatment plan with patient and/or surrogate as well as nursing, discussions with consultants, evaluation of patient's response to treatment, examination of patient, obtaining history from patient or surrogate, ordering and performing treatments and interventions, ordering and review of laboratory studies, ordering and review of radiographic studies, pulse oximetry and re-evaluation of patient's condition.   Otilio Carpen Trumaine Wimer, PA-C [facility] See Amion for pager If no response to pager , please call [phone] until 7pm After 7:00 pm call Elink  [phone]?[place]

## 2021-08-04 NOTE — Progress Notes (Signed)
Unable to obtain labs. Lab is saying they cannot stick pt again for 24hours due to maximum lab attempts reached:contacted elink and requested picc or other means for access

## 2021-08-05 ENCOUNTER — Inpatient Hospital Stay (HOSPITAL_COMMUNITY): Payer: Medicare Other

## 2021-08-05 DIAGNOSIS — J189 Pneumonia, unspecified organism: Secondary | ICD-10-CM

## 2021-08-05 DIAGNOSIS — J9601 Acute respiratory failure with hypoxia: Secondary | ICD-10-CM | POA: Diagnosis not present

## 2021-08-05 DIAGNOSIS — Z940 Kidney transplant status: Secondary | ICD-10-CM

## 2021-08-05 DIAGNOSIS — A419 Sepsis, unspecified organism: Secondary | ICD-10-CM | POA: Diagnosis not present

## 2021-08-05 LAB — BASIC METABOLIC PANEL
Anion gap: 11 (ref 5–15)
BUN: 30 mg/dL — ABNORMAL HIGH (ref 6–20)
CO2: 20 mmol/L — ABNORMAL LOW (ref 22–32)
Calcium: 8 mg/dL — ABNORMAL LOW (ref 8.9–10.3)
Chloride: 108 mmol/L (ref 98–111)
Creatinine, Ser: 1.35 mg/dL — ABNORMAL HIGH (ref 0.44–1.00)
GFR, Estimated: 45 mL/min — ABNORMAL LOW (ref >60)
Glucose, Bld: 115 mg/dL — ABNORMAL HIGH (ref 70–99)
Potassium: 3.2 mmol/L — ABNORMAL LOW (ref 3.5–5.1)
Sodium: 139 mmol/L (ref 135–145)

## 2021-08-05 LAB — CBC
HCT: 24 % — ABNORMAL LOW (ref 36.0–46.0)
Hemoglobin: 7.6 g/dL — ABNORMAL LOW (ref 12.0–15.0)
MCH: 28.3 pg (ref 26.0–34.0)
MCHC: 31.7 g/dL (ref 30.0–36.0)
MCV: 89.2 fL (ref 80.0–100.0)
Platelets: 183 10*3/uL (ref 150–400)
RBC: 2.69 MIL/uL — ABNORMAL LOW (ref 3.87–5.11)
RDW: 16.5 % — ABNORMAL HIGH (ref 11.5–15.5)
WBC: 23 10*3/uL — ABNORMAL HIGH (ref 4.0–10.5)
nRBC: 0.1 % (ref 0.0–0.2)

## 2021-08-05 LAB — GLUCOSE, CAPILLARY
Glucose-Capillary: 103 mg/dL — ABNORMAL HIGH (ref 70–99)
Glucose-Capillary: 118 mg/dL — ABNORMAL HIGH (ref 70–99)
Glucose-Capillary: 118 mg/dL — ABNORMAL HIGH (ref 70–99)
Glucose-Capillary: 126 mg/dL — ABNORMAL HIGH (ref 70–99)
Glucose-Capillary: 129 mg/dL — ABNORMAL HIGH (ref 70–99)
Glucose-Capillary: 173 mg/dL — ABNORMAL HIGH (ref 70–99)

## 2021-08-05 LAB — HEPATIC FUNCTION PANEL
ALT: 11 U/L (ref 0–44)
AST: 12 U/L — ABNORMAL LOW (ref 15–41)
Albumin: 2.1 g/dL — ABNORMAL LOW (ref 3.5–5.0)
Alkaline Phosphatase: 34 U/L — ABNORMAL LOW (ref 38–126)
Bilirubin, Direct: <0.1 mg/dL (ref 0.0–0.2)
Total Bilirubin: 0.6 mg/dL (ref 0.3–1.2)
Total Protein: 4.9 g/dL — ABNORMAL LOW (ref 6.5–8.1)

## 2021-08-05 LAB — MAGNESIUM
Magnesium: 2.2 mg/dL (ref 1.7–2.4)
Magnesium: 2.2 mg/dL (ref 1.7–2.4)

## 2021-08-05 LAB — PHOSPHORUS
Phosphorus: 3.3 mg/dL (ref 2.5–4.6)
Phosphorus: 2.6 mg/dL (ref 2.5–4.6)

## 2021-08-05 LAB — PROTIME-INR
INR: 6.1 (ref 0.8–1.2)
Prothrombin Time: 54 seconds — ABNORMAL HIGH (ref 11.4–15.2)

## 2021-08-05 MED ORDER — ALBUTEROL SULFATE (2.5 MG/3ML) 0.083% IN NEBU
2.5000 mg | INHALATION_SOLUTION | RESPIRATORY_TRACT | Status: DC | PRN
  Administered 2021-08-06: 2.5 mg via RESPIRATORY_TRACT
  Filled 2021-08-05: qty 3

## 2021-08-05 MED ORDER — POLYETHYLENE GLYCOL 3350 17 G PO PACK: 17.0000 g | PACK | Freq: Every day | ORAL | Status: DC | PRN

## 2021-08-05 MED ORDER — VITAMIN K1 10 MG/ML IJ SOLN
5.0000 mg | Freq: Once | INTRAVENOUS | Status: AC
  Administered 2021-08-05: 5 mg via INTRAVENOUS
  Filled 2021-08-05: qty 0.5

## 2021-08-05 MED ORDER — ONDANSETRON HCL 4 MG/2ML IJ SOLN
4.0000 mg | Freq: Once | INTRAMUSCULAR | Status: AC | PRN
  Administered 2021-08-05: 4 mg via INTRAVENOUS
  Filled 2021-08-05: qty 2

## 2021-08-05 MED ORDER — OSMOLITE 1.5 CAL PO LIQD
1000.0000 mL | ORAL | Status: DC
  Administered 2021-08-05 – 2021-08-11 (×6): 1000 mL
  Filled 2021-08-05 (×5): qty 1000

## 2021-08-05 MED ORDER — POTASSIUM CHLORIDE 10 MEQ/100ML IV SOLN
10.0000 meq | INTRAVENOUS | Status: AC
  Administered 2021-08-05 (×4): 10 meq via INTRAVENOUS
  Filled 2021-08-05 (×3): qty 100

## 2021-08-05 MED ORDER — HYDROCORTISONE SOD SUC (PF) 100 MG IJ SOLR: 50.0000 mg | Freq: Every day | INTRAMUSCULAR | Status: DC

## 2021-08-05 MED ORDER — IPRATROPIUM-ALBUTEROL 0.5-2.5 (3) MG/3ML IN SOLN
3.0000 mL | Freq: Three times a day (TID) | RESPIRATORY_TRACT | Status: DC
  Administered 2021-08-05 – 2021-08-08 (×10): 3 mL via RESPIRATORY_TRACT
  Filled 2021-08-05 (×9): qty 3

## 2021-08-05 MED ORDER — PROSOURCE TF PO LIQD
45.0000 mL | Freq: Two times a day (BID) | ORAL | Status: DC
  Administered 2021-08-05 – 2021-08-12 (×15): 45 mL
  Filled 2021-08-05 (×15): qty 45

## 2021-08-05 MED ORDER — PREDNISONE 10 MG PO TABS: 5.0000 mg | ORAL_TABLET | Freq: Every day | ORAL | Status: DC

## 2021-08-05 MED ORDER — ONDANSETRON HCL 4 MG/2ML IJ SOLN
4.0000 mg | Freq: Once | INTRAMUSCULAR | Status: AC
  Administered 2021-08-05: 4 mg via INTRAVENOUS
  Filled 2021-08-05: qty 2

## 2021-08-05 MED ORDER — DOCUSATE SODIUM 100 MG PO CAPS: 100.0000 mg | ORAL_CAPSULE | Freq: Two times a day (BID) | ORAL | Status: DC | PRN

## 2021-08-05 NOTE — Progress Notes (Signed)
Admit: 08/02/2021 ?LOS: 3 ? ?50F s/p ddKT 2005 [facility] Tac/Pred with aspiration PNA, septic shock resolving, VDRF. Has oliguria ? ?Subjective:  ?Had 0.5L UOP yesterday, SCr was 1.4, today is 1.35 ?K 3.2 ?Supratherapeutic INR ? ?02/28 0701 - 03/01 0700 ?In: 1820.5 [I.V.:300.7; IV Piggyback:1519.8] ?Out: 500 [Urine:500] ? ?Filed Weights  ? 08/03/21 0130 08/03/21 0834  ?Weight: 79.6 kg 79.6 kg  ? ? ?Scheduled Meds: ? chlorhexidine  15 mL Mouth Rinse BID  ? Chlorhexidine Gluconate Cloth  6 each Topical Daily  ? hydrocortisone sod succinate (SOLU-CORTEF) inj  50 mg Intravenous Q12H  ? [START ON 08/07/2021] hydrocortisone sod succinate (SOLU-CORTEF) inj  50 mg Intravenous Daily  ? Followed by  ? [START ON 08/09/2021] predniSONE  5 mg Oral Q breakfast  ? insulin aspart  0-9 Units Subcutaneous Q4H  ? ipratropium-albuterol  3 mL Nebulization TID  ? levothyroxine  150 mcg Oral Q0600  ? mouth rinse  15 mL Mouth Rinse q12n4p  ? pantoprazole  40 mg Oral Daily  ? Racepinephrine HCl  0.5 mL Nebulization Once  ? sodium chloride flush  3 mL Intravenous Q12H  ? tacrolimus  1 mg Sublingual BID  ? Warfarin - Pharmacist Dosing Inpatient   Does not apply [id number]  ? ?Continuous Infusions: ? sodium chloride 10 mL/hr at 08/05/21 1000  ? phytonadione (VITAMIN K) IV    ? piperacillin-tazobactam (ZOSYN)  IV Stopped (08/05/21 0919)  ? potassium chloride 100 mL/hr at 08/05/21 1000  ? ?PRN Meds:.acetaminophen **OR** acetaminophen, docusate sodium, lidocaine (PF), ondansetron (ZOFRAN) IV, polyethylene glycol ? ?Current Labs: reviewed  ? ? ?Physical Exam:  Blood pressure 112/69, pulse 90, temperature 97.8 ?F (36.6 ?C), temperature source Axillary, resp. rate 10, height 5' (1.524 m), weight 79.6 kg, SpO2 96 %. ?GEN: Chronically ill-appearing, in bed ?ENT: NCAT ?EYES: Sclera clear ?CV: Tacky, regular ?PULM: Coarse breath sounds bilaterally ?ABD: Soft, nontender ?SKIN: No rashes or lesions ?EXT: No peripheral edema ?NEURO: Aphasic, right-sided paresis ?GU: Foley  present ? ?A ?Oliguria, mild AKI: Likely ATN from acute presentation with shock and hypoxic respiratory failure.  Rejection unlikely ?S/p dd KT 2005 [facility], Tac/Pred (MMF held), BL SCr < 1 ?Aspiration PNA: per CCM, Zosyn ?Shock, septick Weening NE, per CCM ?Hx/o CVA with R sided paresis + aphasia ?DM2 post KT ?Hx/o SLE ?VDRF, improved, extubated ?Hx/o PE on warfarin ? ?P ?Stable GFR, mild AKI ?Cont on IS, weening towards home pred and tac dosing ?Daily weights, Daily Renal Panel, Strict I/Os, Avoid nephrotoxins (NSAIDs, judicious IV Contrast) ? ?Pearson Grippe MD ?08/05/2021, 10:10 AM ? ?Recent Labs  ?Lab 08/03/21 ?1213 08/04/21 ?1425 08/05/21 ?7035  ?NA 143 140 139  ?K 5.3* 3.0* 3.2*  ?CL 115* 108 108  ?CO2 16* 21* 20*  ?GLUCOSE 152* 137* 115*  ?BUN 29* 32* 30*  ?CREATININE 1.27* 1.42* 1.35*  ?CALCIUM 7.8* 8.0* 8.0*  ?PHOS  --  3.8 3.3  ? ?Recent Labs  ?Lab 08/02/21 ?0093 08/02/21 ?1515 08/03/21 ?0135 08/03/21 ?0428 08/04/21 ?1425 08/05/21 ?8182  ?WBC 10.4  --  24.2*  --  24.5* 23.0*  ?NEUTROABS 6.6  --   --   --  22.6*  --   ?HGB 11.0*   < > 12.1 9.9* 8.3* 7.6*  ?HCT 37.1   < > 40.5 29.0* 25.8* 24.0*  ?MCV 97.4  --  94.8  --  89.0 89.2  ?PLT 166  --  157  --  188 183  ? < > = values in this interval not  displayed.  ? ? ? ? ? ? ? ? ? ?  ?

## 2021-08-05 NOTE — Progress Notes (Signed)
eLink Physician-Brief Progress Note ?Patient Name: Connie Ruiz ?DOB: 05-30-1961 ?MRN: [id number] ? ? ?Date of Service ? 08/05/2021  ?HPI/Events of Note ? Notified by bedside RN regarding sinus tachycardia to 140s.  She has mild abdominal pain. Vitals and symptoms seemed to have resolved with zofran per PRN. ? ?On camera check this HR has improved and patient in NAD. HR currently in 120s which is unchanged after possible aspiration event earlier today. SpO2 93% on room air  ?eICU Interventions ? Recommending holding TF at trickle for overnight due to abdominal pain/nausea ?Day team to reassess increasing TF goal in am  ? ? ? ?Intervention Category ?Intermediate Interventions: Arrhythmia - evaluation and management ? ?Kim Lauver Rodman Pickle ?08/05/2021, 8:48 PM ?

## 2021-08-05 NOTE — Progress Notes (Signed)
NAME:  Connie Ruiz, MRN:  [id number], DOB:  1960/08/28, LOS: 3 ADMISSION DATE:  08/02/2021, CONSULTATION DATE:  08/02/2021 REFERRING MD:  Dr. Lorin Mercy, [facility], CHIEF COMPLAINT:  Gurgling respirations   History of Present Illness:  61 yo female from [facility] with fever 101.3.  SpO2 in 70s on room air.  Noted to have gurgling respirations and chest xray showed findings of aspiration pneumonia.  She has prior CVA and is non verbal.   She was not able to protect airway and required intubation.  She has hx of renal disease and previously on dialysis for 5 years.  She failed access multiple times and was transitioned to peritoneal dialysis.  She had renal transplant in November 2005.  She is followed by nephrology at [place].  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, SLE, s/p renal transplant, PE on coumadin, [name], Herpes zoster, Gout, HOCM, Enterococcal endocarditis, GERD, Urinary incontinence, Rectal bleeding  Significant Hospital Events: Including procedures, antibiotic start and stop dates in addition to other pertinent events   2/26 Admit, intubated, start antibiotics, PICC line  2/27 On vent, remains on Levophed 61mcg, Vanc, Cefepime  2/28 Extubated, did ok overnight, but less awake this morning and minimal UOP, lab unable to draw, on bipap 3/1 on [place] and Bipap qhs INR 6.1 heparin held yesterday and not been on coumadin since admission  Interim History / Subjective:   Mental status better this AM, c/o epigastric pain and nausea.  No overnight events   Objective   Blood pressure 122/75, pulse 97, temperature 97.8 F (36.6 C), temperature source Axillary, resp. rate 13, height 5' (1.524 m), weight 79.6 kg, SpO2 100 %.        Intake/Output Summary (Last 24 hours) at 08/05/2021 0908 Last data filed at 08/05/2021 0600 Gross per 24 hour  Intake 1782.65 ml  Output 500 ml  Net  1282.65 ml    Filed Weights   08/03/21 0130 08/03/21 0834  Weight: 79.6 kg 79.6 kg    General:  elderly ill-appearing F in no acute distress HEENT: MM pink/moist, sclera anicteric, pupils equal  Neuro: awake, nodding appropriately to questions and motioning to stomach but not speaking.  Moving all extremities equally, noted R facial droop that apppears new since yesterday CV: s1s2 rrr, no m/r/g PULM:  rhonchi and wheezing bilaterally, on Gas without tachypnea or accessory muscle use  GI: soft, bsx4 active, mildly TTP in the epigastric region  Extremities: warm/dry, no edema  Skin: no rashes or lesions   Labs reviewed  K 3.2 CO2 20 Creatinine 1.3, GFR 45 Mag 2.2 WBC 23 Hgb 7.6 INR 6.1 Hepatic function panel unremarkable   Resolved Hospital Problem list   Septic Shock  Assessment & Plan:   Acute hypoxic respiratory failure with compromised airway in setting of aspiration pneumonia Extubated, mental and respiratory status are improving  CXR with persistent LLL atelectasis vs consolidation and bedside POCUS with dense RLL consolidation -MRSA negative, cultures negative thus far continue Zosyn for 7 day course follow blood and respiratory cultures to completion -Bronchial hygiene and RT/bronchodilator protocol. -off Levophed   Acute R facial droop -stat head CT now    Hx of SLE s/p renal transplant. AKI Hypokalemia Hypomagnesemia  - followed by nephrology at [place] -appreciate nephrology consult - on chronic prednisone 5mg , tapering stress dose steroids - continue prograff -UOP slightly improved to 500cc yesterday -  continue to follow renal indices and electrolytes    Hx of PE on coumadin with elevated  INR. - Coumadin held and received  Vit K on 2/26 - INR 1.8, then up to 6.1 today  -vitamin K 5mg  today and follow coags    Acute metabolic encephalopathy from hypoxia. Hx of CVA with aphasia and Rt side weakness. -improved yesterday, more somnolent today,  check ABG while waiting on labs    Protein Calorie Malnutrition  POA  -still has not passed swallow screen, place cortrak and start TF  Best Practice (right click and "Reselect all SmartList Selections" daily)   Diet/type: NPO, not yet passed swallow screen  DVT prophylaxis: SCD GI prophylaxis: PPI Lines: N/A Foley:  Yes, and it is still needed Code Status:  full code Last date of multidisciplinary goals of care discussion [family updated 2/27 at the bedside, pending for today]  Labs   CBC: Recent Labs  Lab 08/02/21 0511 08/02/21 1515 08/03/21 0135 08/03/21 0428 08/04/21 1425 08/05/21 0341  WBC 10.4  --  24.2*  --  24.5* 23.0*  NEUTROABS 6.6  --   --   --  22.6*  --   HGB 11.0* 8.8* 12.1 9.9* 8.3* 7.6*  HCT 37.1 26.0* 40.5 29.0* 25.8* 24.0*  MCV 97.4  --  94.8  --  89.0 89.2  PLT 166  --  157  --  188 183     Basic Metabolic Panel: Recent Labs  Lab 08/02/21 0511 08/02/21 1515 08/03/21 0428 08/03/21 1213 08/04/21 1425 08/05/21 0341  NA 140 141 140 143 140 139  K 4.3 3.3* 3.7 5.3* 3.0* 3.2*  CL 107  --   --  115* 108 108  CO2 20*  --   --  16* 21* 20*  GLUCOSE 68*  --   --  152* 137* 115*  BUN 24*  --   --  29* 32* 30*  CREATININE 1.07*  --   --  1.27* 1.42* 1.35*  CALCIUM 8.0*  --   --  7.8* 8.0* 8.0*  MG  --   --   --   --  1.1* 2.2  PHOS  --   --   --   --  3.8 3.3    GFR: Estimated Creatinine Clearance: 41.3 mL/min (A) (by C-G formula based on SCr of 1.35 mg/dL (H)). Recent Labs  Lab 08/02/21 0511 08/03/21 0135 08/04/21 1425 08/05/21 0341  WBC 10.4 24.2* 24.5* 23.0*  LATICACIDVEN 0.8  --   --   --      Liver Function Tests: Recent Labs  Lab 08/02/21 0511  AST 17  ALT 10  ALKPHOS 48  BILITOT 0.9  PROT 5.1*  ALBUMIN 2.2*    No results for input(s): LIPASE, AMYLASE in the last 168 hours. No results for input(s): AMMONIA in the last 168 hours.  ABG    Component Value Date/Time   PHART 7.242 (L) 08/03/2021 0428   PCO2ART 44.5  08/03/2021 0428   PO2ART 100 08/03/2021 0428   HCO3 19.2 (L) 08/03/2021 0428   TCO2 21 (L) 08/03/2021 0428   ACIDBASEDEF 8.0 (H) 08/03/2021 0428   O2SAT 97 08/03/2021 0428      Coagulation Profile: Recent Labs  Lab 08/02/21 0511 08/03/21 0135 08/04/21 1425 08/05/21 0341  INR 5.3* 1.8* 5.3* 6.1*     Cardiac Enzymes: No results for input(s): CKTOTAL, CKMB, CKMBINDEX, TROPONINI in the last 168 hours.  HbA1C: Hgb A1c MFr Bld  Date/Time Value Ref Range  Status  05/27/2021 06:20 AM 6.0 (H) 4.8 - 5.6 % Final    Comment:    (NOTE) Pre diabetes:          5.7%-6.4%  Diabetes:              >6.4%  Glycemic control for   <7.0% adults with diabetes   01/27/2021 10:09 AM 8.2 (H) 4.8 - 5.6 % Final    Comment:    (NOTE) Pre diabetes:          5.7%-6.4%  Diabetes:              >6.4%  Glycemic control for   <7.0% adults with diabetes     CBG: Recent Labs  Lab 08/04/21 1513 08/04/21 1949 08/04/21 2319 08/05/21 0341 08/05/21 0816  GLUCAP 120* 135* 110* 103* 118*     Review of Systems:   Unable to obtain  Past Medical History:  She,  has a past medical history of Anxiety, Candida esophagitis ([place]) (11/12/2014), CEREBROVASCULAR ACCIDENT, ACUTE (04/15/2010), CLOSTRIDIUM DIFFICILE COLITIS, HX OF (08/21/2007), CONGESTIVE HEART FAILURE (08/21/2007), Current use of long term anticoagulation, CVA (04/17/2010), Depression, DIABETES MELLITUS, TYPE II (08/21/2007), DVT, HX OF (08/21/2007), GERD (08/21/2007), GOUT (08/21/2007), History of stroke with residual effects, HYPERLIPIDEMIA (08/21/2007), HYPERTENSION (08/21/2007), KIDNEY TRANSPLANTATION, HX OF (08/22/2007), LUPUS (08/21/2007), OSTEOPOROSIS (08/21/2007), Pulmonary embolism ([place]) (07/16/2010), Renal failure, RENAL INSUFFICIENCY (08/21/2007), Right sided weakness, Steroid-induced hyperglycemia (11/09/2014), Tachycardia, and THYROID NODULE, 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   Surgical History:   Past Surgical History:  Procedure Laterality Date   BIOPSY   02/04/2021   Procedure: BIOPSY;  Surgeon: [name], MD;  Location: [facility];  Service: Endoscopy;;   CESAREAN SECTION     CHOLECYSTECTOMY     COLONOSCOPY  06/2000   [facility]    ENTEROSCOPY N/A 11/11/2014   Procedure: ENTEROSCOPY;  Surgeon: Ladene Artist, MD;  Location: [facility];  Service: Endoscopy;  Laterality: N/A;   FLEXIBLE SIGMOIDOSCOPY N/A 02/04/2021   Procedure: FLEXIBLE SIGMOIDOSCOPY;  Surgeon: [name], MD;  Location: [facility];  Service: Endoscopy;  Laterality: N/A;   KIDNEY TRANSPLANT Right 2009   RENAL BIOPSY, OPEN  1981   TUBAL LIGATION       Social History:   reports that she has never smoked. She has never used smokeless tobacco. She reports that she does not drink alcohol and does not use drugs.   Family History:  Her family history includes Asthma in her daughter and sister; Cancer in her maternal grandfather and paternal grandfather; Heart attack in her mother; Heart disease in her father.   Allergies Allergies  Allergen Reactions   Oxycodone-Acetaminophen Shortness Of Breath and Nausea Only   Propoxyphene Nausea Only and Shortness Of Breath    States takes tylenol at home   Sulfonamide Derivatives Shortness Of Breath and Nausea Only   Codeine Nausea Only   Hydrocodone-Acetaminophen     unknown   Hydromorphone     On MAR   Other Other (See Comments)    No blood, Jehovaeh Witness    Sulfamethoxazole    Tape     Redness**PAPER TAPE OK**   Gabapentin Anxiety    twitching   Latex Rash   Metoprolol Rash   Morphine And Related Rash    IV site on arm is red, patient reports this is improving.  NO shortness of breath reported. Patient is given dose of Benadryl when taking Morphine    Rosiglitazone Rash     Home Medications  Prior to Admission medications  Medication Sig Start Date End Date Taking? Authorizing Provider  acetaminophen (TYLENOL) 500 MG tablet Take 1,000 mg by mouth every 6 (six) hours as needed for moderate pain.   Yes  [provider]  alendronate (FOSAMAX) 70 MG tablet Take 70 mg by mouth every Saturday.   Yes [provider]  Amino Acids-Protein Hydrolys (FEEDING SUPPLEMENT, PRO-STAT SUGAR FREE 64,) LIQD Take 30 mLs by mouth in the morning and at bedtime.   Yes [provider]  amitriptyline (ELAVIL) 10 MG tablet Take 10 mg by mouth at bedtime.   Yes [provider]  calcitRIOL (ROCALTROL) 0.25 MCG capsule TAKE 1 CAPSULE(0.25 MCG) BY MOUTH DAILY. NO REFILLS WITHOUT APPOINTMENT Patient taking differently: Take 0.25 mcg by mouth daily. 02/13/19  Yes Renato Shin, MD  carvedilol (COREG) 6.25 MG tablet Take 1 tablet (6.25 mg total) by mouth 2 (two) times daily with a meal. 02/13/21  Yes Little Ishikawa, MD  cholecalciferol (VITAMIN D3) 25 MCG (1000 UNIT) tablet Take 1,000 Units by mouth daily.   Yes [provider]  cholestyramine (QUESTRAN) 4 g packet Take 1 packet (4 g total) by mouth daily. 05/29/21 09/12/21 Yes Amin, Jeanella Flattery, MD  dicyclomine (BENTYL) 10 MG capsule Take 1 capsule (10 mg total) by mouth 3 (three) times daily before meals. 02/13/21  Yes Little Ishikawa, MD  diltiazem [facility]) 360 MG 24 hr capsule Take 1 capsule (360 mg total) by mouth daily. 06/27/19  Yes [name], [name], MD  donepezil (ARICEPT) 10 MG tablet Take 10 mg by mouth at bedtime.  11/18/16  Yes [provider]  esomeprazole (NEXIUM) 20 MG capsule TAKE 1 CAPSULE(20 MG) BY MOUTH DAILY Patient taking differently: Take 20 mg by mouth daily at 12 noon. 07/17/18  Yes Renato Shin, MD  famotidine (PEPCID) 20 MG tablet Take 20 mg by mouth 2 (two) times daily.   Yes [provider]  FLUoxetine (PROZAC) 20 MG capsule Take 20 mg by mouth daily.   Yes [provider]  furosemide (LASIX) 20 MG tablet Take 20 mg by mouth daily.   Yes [provider]  hydrocortisone (ANUSOL-HC) 25 MG suppository Place 1 suppository (25 mg total) rectally at bedtime. 05/29/21  Yes  Amin, Ankit Chirag, MD  insulin lispro (HUMALOG) 100 UNIT/ML injection Inject 0-12 Units into the skin 3 (three) times daily before meals. Per Sliding Scale. If blood sugar is: 0-150: 0 units 151-200: 2 units 201-250: 4 units 251-300: 6 units 301-350: 8 units 351-400: 10 units 401-450: 12 units   Yes [provider]  lactose free nutrition (BOOST) LIQD Take 237 mLs by mouth in the morning and at bedtime.   Yes [provider]  levothyroxine (SYNTHROID) 150 MCG tablet Take 1 tablet (150 mcg total) by mouth daily at 6 (six) AM. Patient taking differently: Take 150 mcg by mouth daily before breakfast. 02/14/21  Yes Little Ishikawa, MD  loperamide (IMODIUM) 2 MG capsule Take 2 mg by mouth every 6 (six) hours as needed for diarrhea or loose stools.   Yes [provider]  magnesium chloride (SLOW-MAG) 64 MG TBEC SR tablet Take 1 tablet by mouth daily.   Yes [provider]  mirtazapine (REMERON) 30 MG tablet Take 1 tablet (30 mg total) by mouth at bedtime. 07/28/15  Yes Jaffe, Adam R, DO  morphine (MSIR) 15 MG tablet Take 15 mg by mouth every 4 (four) hours as needed (pain).   Yes [provider]  Multiple Vitamin (MULTIVITAMIN)  tablet Take 1 tablet by mouth daily.   Yes [provider]  ondansetron (ZOFRAN ODT) 4 MG disintegrating tablet 4mg  ODT q4 hours prn nausea/vomiting Patient taking differently: Take 4 mg by mouth every 4 (four) hours as needed for nausea or vomiting. 06/15/15  Yes Mesner, Corene Cornea, MD  potassium chloride SA (KLOR-CON M) 20 MEQ tablet Take 20 mEq by mouth daily.   Yes [provider]  predniSONE (DELTASONE) 5 MG tablet Take 1 tablet (5 mg total) by mouth daily with breakfast. Patient taking differently: Take 5 mg by mouth daily. Continuous 05/29/21  Yes Amin, Ankit Chirag, MD  PSYLLIUM PO Take 11.8 g by mouth daily. Reguloid 3g/5.4g   Yes [provider]  Skin Protectants, Misc. (EUCERIN) cream Apply 1  application topically every evening. To feet   Yes [provider]  tacrolimus (PROGRAF) 0.5 MG capsule Take 0.5 mg by mouth See admin instructions. 0.5mg  every 12 hours along with Tacrolimus 2mg  dose for a total dose of 2.5mg  every 12 hours. 04/17/21  Yes [provider]  tacrolimus (PROGRAF) 1 MG capsule Take 2 mg by mouth See admin instructions. 2mg  every 12 hours along with Tacrolimus 0.5mg  dose for a total dose of 2.5mg  every 12 hours. 01/15/18  Yes [provider]  nystatin (MYCOSTATIN/NYSTOP) powder Apply topically 2 (two) times daily. Patient not taking: Reported on 08/02/2021 05/29/21   Damita Lack, MD     Critical care time: 35 minutes     CRITICAL CARE Performed by: Otilio Carpen Janine Reller   Total critical care time: 35 minutes  Critical care time was exclusive of separately billable procedures and treating other patients.  Critical care was necessary to treat or prevent imminent or life-threatening deterioration.  Critical care was time spent personally by me on the following activities: development of treatment plan with patient and/or surrogate as well as nursing, discussions with consultants, evaluation of patient's response to treatment, examination of patient, obtaining history from patient or surrogate, ordering and performing treatments and interventions, ordering and review of laboratory studies, ordering and review of radiographic studies, pulse oximetry and re-evaluation of patient's condition.   Otilio Carpen Karon Cotterill, PA-C [facility] See Amion for pager If no response to pager , please call [phone] until 7pm After 7:00 pm call Elink  [phone]

## 2021-08-05 NOTE — Procedures (Signed)
Cortrak ° °Tube Type:  Cortrak - 43 inches °Tube Location:  Left nare °Initial Placement:  Stomach °Secured by: Bridle °Technique Used to Measure Tube Placement:  Marking at nare/corner of mouth °Cortrak Secured At:  60 cm ° °Cortrak Tube Team Note: ° °Consult received to place a Cortrak feeding tube.  ° °X-ray is required, abdominal x-ray has been ordered by the Cortrak team. Please confirm tube placement before using the Cortrak tube.  ° °If the tube becomes dislodged please keep the tube and contact the Cortrak team at [email or web address] (password [email or web address]) for replacement.  °If after hours and replacement cannot be delayed, place a NG tube and confirm placement with an abdominal x-ray.  ° ° °Johnnie Goynes MS, RD, LDN °Please refer to AMION for RD and/or RD on-call/weekend/after hours pager ° ° °

## 2021-08-05 NOTE — Progress Notes (Signed)
Initial Nutrition Assessment ? ?DOCUMENTATION CODES:  ? ?Non-severe (moderate) malnutrition in context of chronic illness ? ?INTERVENTION:  ? ?Initiate tube feeds via Cortrak: ?- Start Osmolite 1.5 @ 20 ml/hr and advance by 10 ml q 8 hours to goal rate of 50 ml/hr (1200 ml/day) ?- ProSource TF 45 ml BID ? ?Tube feeding regimen at goal rate provides 1880 kcal, 103 grams of protein, and 917 ml of H2O. ? ?Monitor magnesium, potassium, and phosphorus BID for at least 3 days, MD to replete as needed, as pt is at risk for refeeding syndrome given malnutrition. ? ?NUTRITION DIAGNOSIS:  ? ?Moderate Malnutrition related to chronic illness (CHF, CVA) as evidenced by mild fat depletion, moderate muscle depletion, percent weight loss (16% weight loss in 6 months). ? ?GOAL:  ? ?Patient will meet greater than or equal to 90% of their needs ? ?MONITOR:  ? ?Diet advancement, Labs, Weight trends, TF tolerance, I & O's ? ?REASON FOR ASSESSMENT:  ? ?Consult ?Enteral/tube feeding initiation and management ? ?ASSESSMENT:  ? ?61 year old female who presented to the ED from SNF on 2/26 with AMS. PMH of CHF, T2DM, HTN, HLD, CVA, chronic cognitive impairment (mostly nonverbal), renal transplant in 2005, SLE, PE. Pt required intubation and admitted with sepsis due to pneumonia. ? ?02/27 - extubated ?03/01 - Cortrak placed (tip gastric) ? ?Discussed pt with RN and during ICU rounds. Consult received for enteral nutrition initiation and management. Pt with Cortrak tube in distal stomach per abdominal x-ray. ? ?Spoke with pt at bedside. Pt mostly nonverbal but able to answer RD's questions with head nods. Pt reports poor appetite for a while PTA. She thinks she has been losing weight but is unsure of her UBW or how much weight she may have lost. Pt does not ambulate at baseline. ? ?Reviewed weight history in chart. Pt with a 15.2 kg weight loss since 02/13/21. This is a 16% weight loss in 6 months which is severe and significant for timeframe. Pt  meets criteria for moderate chronic malnutrition. ? ?Pt at risk for refeeding. RD to start tube feeds at trickle rate and slowly advance to goal. RN aware of plan. ? ?Medications reviewed and include: SSI q 4 hours, IV solu-cortef, protonix, prograf, warfarin, IV vitamin K 5 mg once, IV abx, IV KCl 10 mEq x 4 runs ? ?Labs reviewed: potassium 3.2, BUN 30, creatinine 1.35, WBC 23.0, hemoglobin 7.6, INR 6.1 ?CBG's: 103-135 ? ?UOP: 500 ml x 24 hours ?I/O's: +4.9 L since admit ? ?NUTRITION - FOCUSED PHYSICAL EXAM: ? ?Flowsheet Row Most Recent Value  ?Orbital Region Moderate depletion  ?Upper Arm Region Mild depletion  ?Thoracic and Lumbar Region Mild depletion  ?Buccal Region No depletion  ?Temple Region Moderate depletion  ?Clavicle Bone Region Moderate depletion  ?Clavicle and Acromion Bone Region Moderate depletion  ?Scapular Bone Region Moderate depletion  ?Dorsal Hand Mild depletion  ?Patellar Region Severe depletion  ?Anterior Thigh Region Severe depletion  ?Posterior Calf Region Moderate depletion  ?Edema (RD Assessment) Mild  [BUE, BLE]  ?Hair Reviewed  ?Eyes Reviewed  ?Mouth Reviewed  ?Skin Reviewed  ?Nails Reviewed  ? ?  ? Suspect severe depletions in BLE are related to bedbound/wheelchair-bound status. ? ?Diet Order:   ?Diet Order   ? ?       ?  Diet NPO time specified  Diet effective now       ?  ? ?  ?  ? ?  ? ? ?EDUCATION NEEDS:  ? ?Not appropriate for  education at this time ? ?Skin:  Skin Assessment: Reviewed RN Assessment (MASD to groin) ? ?Last BM:  08/05/21 small type 6 ? ?Height:  ? ?Ht Readings from Last 1 Encounters:  ?08/03/21 5' (1.524 m)  ? ? ?Weight:  ? ?Wt Readings from Last 1 Encounters:  ?08/03/21 79.6 kg  ? ? ?BMI:  Body mass index is 34.27 kg/m?. ? ?Estimated Nutritional Needs:  ? ?Kcal:  1800-2000 ? ?Protein:  90-105 grams ? ?Fluid:  1.8 L/day ? ? ? ?Gustavus Bryant, MS, RD, LDN ?Inpatient Clinical Dietitian ?Please see AMiON for contact information. ? ?

## 2021-08-05 NOTE — Progress Notes (Signed)
Speech Language Pathology Treatment: Dysphagia  ?Patient Details ?Name: Connie Ruiz ?MRN: [id number] ?DOB: [birth year] ?Today's Date: 08/05/2021 ?Time: 1005-1025 ?SLP Time Calculation (min) (ACUTE ONLY): 20 min ? ?Assessment / Plan / Recommendation ?Clinical Impression ? Patient seen by SLP for skilled treatment session focused on dysphagia goals. SLP spoke briefly with RN who reported that patient has not wanted to even attempt oral meds. RD had contacted SLP to determine if need to proceed with Cortrak. Patient was alert and although she shook her head at first, when SLP explained importance of trying sips of water, she agreed. Patient accepted 4 teaspoon sips of water and exhibited delayed swallow initiation, suspected decreased pharyngeal contraction and laryngeal elevation. She did exhibit delayed cough and throat clearing. Patient then shook head to indicate she did not want to try any more. SLP explained to her that Cortrak will be placed for temporary nutrition and for her to get her medications and she did seem to understand. SLP plans to f/u with patient next date for continued PO trials and determine readiness for objective swallow study. ? ?  ?HPI HPI: Patient is a 61 y.o. 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 She was admitted on 2/26 with fever, hypoxia and found o have aspiration PNA. She was intubated and started on vancomycin and zosyn. She was extubated on 12/27, but showed increased work of breathing and was put on BiPAP. She was taken off BiPAP late AM 2/28. SLP initially tried ot evaluate patient this AM but at that time, patient was in respiratory distress with oxygen saturations in upper 70's and marked thick yellow-green secretions. She continues to be NPO. ?  ?   ?SLP Plan ? Continue with current plan of care ? ?  ?  ?Recommendations for follow up therapy are one component of a  multi-disciplinary discharge planning process, led by the attending physician.  Recommendations may be updated based on patient status, additional functional criteria and insurance authorization. ?  ? ?Recommendations  ?Diet recommendations: NPO;Other(comment) (teaspoon sips water PRN) ?Medication Administration: Via alternative means  ?   ?    ?   ? ? ? ? Oral Care Recommendations: Oral care BID;Staff/trained caregiver to provide oral care ?Follow Up Recommendations: Skilled nursing-short term rehab (<3 hours/day) ?Assistance recommended at discharge: Frequent or constant Supervision/Assistance ?SLP Visit Diagnosis: Dysphagia, unspecified (R13.10) ?Plan: Continue with current plan of care ? ? ? ? ?  ?  ?Sonia Baller, MA, CCC-SLP ?Speech Therapy ? ?

## 2021-08-06 DIAGNOSIS — A419 Sepsis, unspecified organism: Secondary | ICD-10-CM | POA: Diagnosis not present

## 2021-08-06 DIAGNOSIS — J189 Pneumonia, unspecified organism: Secondary | ICD-10-CM | POA: Diagnosis not present

## 2021-08-06 DIAGNOSIS — J9601 Acute respiratory failure with hypoxia: Secondary | ICD-10-CM | POA: Diagnosis not present

## 2021-08-06 DIAGNOSIS — E440 Moderate protein-calorie malnutrition: Secondary | ICD-10-CM

## 2021-08-06 LAB — BASIC METABOLIC PANEL
Anion gap: 12 (ref 5–15)
Anion gap: 9 (ref 5–15)
BUN: 32 mg/dL — ABNORMAL HIGH (ref 6–20)
BUN: 29 mg/dL — ABNORMAL HIGH (ref 6–20)
CO2: 23 mmol/L (ref 22–32)
CO2: 22 mmol/L (ref 22–32)
Calcium: 8.3 mg/dL — ABNORMAL LOW (ref 8.9–10.3)
Calcium: 8.4 mg/dL — ABNORMAL LOW (ref 8.9–10.3)
Chloride: 108 mmol/L (ref 98–111)
Chloride: 110 mmol/L (ref 98–111)
Creatinine, Ser: 1.24 mg/dL — ABNORMAL HIGH (ref 0.44–1.00)
Creatinine, Ser: 1.11 mg/dL — ABNORMAL HIGH (ref 0.44–1.00)
GFR, Estimated: 50 mL/min — ABNORMAL LOW (ref >60)
GFR, Estimated: 57 mL/min — ABNORMAL LOW (ref >60)
Glucose, Bld: 179 mg/dL — ABNORMAL HIGH (ref 70–99)
Glucose, Bld: 234 mg/dL — ABNORMAL HIGH (ref 70–99)
Potassium: 2.8 mmol/L — ABNORMAL LOW (ref 3.5–5.1)
Potassium: 3.7 mmol/L (ref 3.5–5.1)
Sodium: 143 mmol/L (ref 135–145)
Sodium: 141 mmol/L (ref 135–145)

## 2021-08-06 LAB — GLUCOSE, CAPILLARY
Glucose-Capillary: 164 mg/dL — ABNORMAL HIGH (ref 70–99)
Glucose-Capillary: 204 mg/dL — ABNORMAL HIGH (ref 70–99)
Glucose-Capillary: 193 mg/dL — ABNORMAL HIGH (ref 70–99)
Glucose-Capillary: 215 mg/dL — ABNORMAL HIGH (ref 70–99)
Glucose-Capillary: 183 mg/dL — ABNORMAL HIGH (ref 70–99)
Glucose-Capillary: 231 mg/dL — ABNORMAL HIGH (ref 70–99)

## 2021-08-06 LAB — CBC
HCT: 24.5 % — ABNORMAL LOW (ref 36.0–46.0)
Hemoglobin: 8 g/dL — ABNORMAL LOW (ref 12.0–15.0)
MCH: 28.6 pg (ref 26.0–34.0)
MCHC: 32.7 g/dL (ref 30.0–36.0)
MCV: 87.5 fL (ref 80.0–100.0)
Platelets: 163 10*3/uL (ref 150–400)
RBC: 2.8 MIL/uL — ABNORMAL LOW (ref 3.87–5.11)
RDW: 16.5 % — ABNORMAL HIGH (ref 11.5–15.5)
WBC: 16.3 10*3/uL — ABNORMAL HIGH (ref 4.0–10.5)
nRBC: 0.3 % — ABNORMAL HIGH (ref 0.0–0.2)

## 2021-08-06 LAB — MAGNESIUM
Magnesium: 2.3 mg/dL (ref 1.7–2.4)
Magnesium: 2.2 mg/dL (ref 1.7–2.4)

## 2021-08-06 LAB — PROTIME-INR
INR: 1.3 — ABNORMAL HIGH (ref 0.8–1.2)
INR: 1.3 — ABNORMAL HIGH (ref 0.8–1.2)
Prothrombin Time: 16.6 seconds — ABNORMAL HIGH (ref 11.4–15.2)
Prothrombin Time: 16.3 seconds — ABNORMAL HIGH (ref 11.4–15.2)

## 2021-08-06 LAB — PHOSPHORUS
Phosphorus: 2.4 mg/dL — ABNORMAL LOW (ref 2.5–4.6)
Phosphorus: 2.7 mg/dL (ref 2.5–4.6)

## 2021-08-06 MED ORDER — HYDROCORTISONE SOD SUC (PF) 100 MG IJ SOLR
50.0000 mg | Freq: Every day | INTRAMUSCULAR | Status: AC
  Administered 2021-08-07: 50 mg via INTRAVENOUS
  Filled 2021-08-06 (×2): qty 2

## 2021-08-06 MED ORDER — PANTOPRAZOLE 2 MG/ML SUSPENSION
40.0000 mg | Freq: Every day | ORAL | Status: DC
  Administered 2021-08-06 – 2021-08-10 (×5): 40 mg
  Filled 2021-08-06 (×5): qty 20

## 2021-08-06 MED ORDER — METOCLOPRAMIDE HCL 5 MG/5ML PO SOLN
5.0000 mg | Freq: Three times a day (TID) | ORAL | Status: AC
  Administered 2021-08-06 – 2021-08-07 (×5): 5 mg
  Filled 2021-08-06 (×7): qty 10

## 2021-08-06 MED ORDER — PREDNISONE 10 MG PO TABS
5.0000 mg | ORAL_TABLET | Freq: Every day | ORAL | Status: DC
  Administered 2021-08-09 – 2021-08-12 (×4): 5 mg
  Filled 2021-08-06 (×4): qty 1

## 2021-08-06 MED ORDER — ENOXAPARIN SODIUM 80 MG/0.8ML IJ SOSY
1.0000 mg/kg | PREFILLED_SYRINGE | Freq: Two times a day (BID) | INTRAMUSCULAR | Status: DC
  Administered 2021-08-06 – 2021-08-08 (×4): 80 mg via SUBCUTANEOUS
  Filled 2021-08-06 (×4): qty 0.8

## 2021-08-06 MED ORDER — KETOROLAC TROMETHAMINE 15 MG/ML IJ SOLN
7.5000 mg | Freq: Once | INTRAMUSCULAR | Status: AC
  Administered 2021-08-06: 7.5 mg via INTRAVENOUS
  Filled 2021-08-06: qty 1

## 2021-08-06 MED ORDER — POTASSIUM CHLORIDE 10 MEQ/100ML IV SOLN
10.0000 meq | INTRAVENOUS | Status: AC
  Administered 2021-08-06 (×4): 10 meq via INTRAVENOUS
  Filled 2021-08-06 (×4): qty 100

## 2021-08-06 MED ORDER — ONDANSETRON HCL 4 MG/2ML IJ SOLN
4.0000 mg | Freq: Once | INTRAMUSCULAR | Status: AC
  Administered 2021-08-06: 4 mg via INTRAVENOUS
  Filled 2021-08-06: qty 2

## 2021-08-06 MED ORDER — POLYETHYLENE GLYCOL 3350 17 G PO PACK: 17.0000 g | PACK | Freq: Every day | ORAL | Status: DC | PRN

## 2021-08-06 MED ORDER — ACETAMINOPHEN 650 MG RE SUPP
650.0000 mg | Freq: Four times a day (QID) | RECTAL | Status: DC | PRN
Start: 2021-08-06 — End: 2021-08-12

## 2021-08-06 MED ORDER — LEVOTHYROXINE SODIUM 75 MCG PO TABS
150.0000 ug | ORAL_TABLET | Freq: Every day | ORAL | Status: DC
  Administered 2021-08-07 – 2021-08-12 (×6): 150 ug
  Filled 2021-08-06 (×7): qty 2

## 2021-08-06 MED ORDER — ONDANSETRON HCL 4 MG/2ML IJ SOLN
4.0000 mg | Freq: Four times a day (QID) | INTRAMUSCULAR | Status: DC | PRN
  Administered 2021-08-06: 4 mg via INTRAVENOUS
  Filled 2021-08-06: qty 2

## 2021-08-06 MED ORDER — ACETAMINOPHEN 325 MG PO TABS
650.0000 mg | ORAL_TABLET | Freq: Four times a day (QID) | ORAL | Status: DC | PRN
  Administered 2021-08-06 – 2021-08-10 (×2): 650 mg
  Filled 2021-08-06 (×2): qty 2

## 2021-08-06 MED ORDER — POTASSIUM CHLORIDE 20 MEQ PO PACK
40.0000 meq | PACK | Freq: Once | ORAL | Status: AC
  Administered 2021-08-06: 40 meq
  Filled 2021-08-06: qty 2

## 2021-08-06 MED ORDER — POTASSIUM PHOSPHATES 15 MMOLE/5ML IV SOLN
15.0000 mmol | Freq: Once | INTRAVENOUS | Status: AC
  Administered 2021-08-06: 15 mmol via INTRAVENOUS
  Filled 2021-08-06: qty 5

## 2021-08-06 MED ORDER — DOCUSATE SODIUM 50 MG/5ML PO LIQD: 100.0000 mg | Freq: Two times a day (BID) | ORAL | Status: DC | PRN

## 2021-08-06 MED ORDER — OXYCODONE HCL 5 MG/5ML PO SOLN
5.0000 mg | Freq: Once | ORAL | Status: DC
  Filled 2021-08-06: qty 5

## 2021-08-06 NOTE — Progress Notes (Signed)
Speech Language Pathology Treatment: Dysphagia  ?Patient Details ?Name: Connie Ruiz ?MRN: [id number] ?DOB: 1961-02-13 ?Today's Date: 08/06/2021 ?Time: 0370-4888 ?SLP Time Calculation (min) (ACUTE ONLY): 15 min ? ?Assessment / Plan / Recommendation ?Clinical Impression ? Patient seen by SLP for skilled treatment session focused on dysphagia goals. Patient awake, alert, smiling some. Cortrak is in place. She was agreeable to oral care via toothette sponge soaked in water and did not exhibit any facial grimacing as she has in past sessions. Vital signs were all within appropriate ranges but patient appearing with some SOB. Oral mucosa was clean and moist. SLP then observed patient with 1/2 spoon sip of water. She exhibited a mild intensity immediate throat clear and grimaced after swallowing. She declined any further PO trials. SLP to continue to follow patient for readiness for PO's/objective swallow study.  ? ?  ?HPI HPI: Patient is a 61 y.o. 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 She was admitted on 2/26 with fever, hypoxia and found o have aspiration PNA. She was intubated and started on vancomycin and zosyn. She was extubated on 12/27, but showed increased work of breathing and was put on BiPAP. She was taken off BiPAP late AM 2/28. SLP initially tried ot evaluate patient this AM but at that time, patient was in respiratory distress with oxygen saturations in upper 70's and marked thick yellow-green secretions. She continues to be NPO. ?  ?   ?SLP Plan ? Continue with current plan of care ? ?  ?  ?Recommendations for follow up therapy are one component of a multi-disciplinary discharge planning process, led by the attending physician.  Recommendations may be updated based on patient status, additional functional criteria and insurance authorization. ?  ? ?Recommendations  ?Diet recommendations:  NPO ?Medication Administration: Via alternative means  ?   ?    ?   ? ? ? ? Oral Care Recommendations: Oral care BID;Staff/trained caregiver to provide oral care ?Follow Up Recommendations: Skilled nursing-short term rehab (<3 hours/day) ?Assistance recommended at discharge: Frequent or constant Supervision/Assistance ?SLP Visit Diagnosis: Dysphagia, unspecified (R13.10) ?Plan: Continue with current plan of care ? ? ? ? ?  ?  ? ?Sonia Baller, MA, CCC-SLP ?Speech Therapy ? ?

## 2021-08-06 NOTE — Progress Notes (Signed)
ANTICOAGULATION CONSULT NOTE - Follow Up Consult ? ?Pharmacy Consult for Enoxaparin ?Indication: history of pulmonary embolus ? ?Allergies  ?Allergen Reactions  ? Oxycodone-Acetaminophen Shortness Of Breath and Nausea Only  ? Propoxyphene Nausea Only and Shortness Of Breath  ?  States takes tylenol at home  ? Sulfonamide Derivatives Shortness Of Breath and Nausea Only  ? Codeine Nausea Only  ? Hydrocodone-Acetaminophen   ?  unknown  ? Hydromorphone   ?  On MAR  ? Other Other (See Comments)  ?  No blood, Jehovaeh Witness   ? Sulfamethoxazole   ? Tape   ?  Redness**PAPER TAPE OK**  ? Gabapentin Anxiety  ?  twitching  ? Latex Rash  ? Metoprolol Rash  ? Morphine And Related Rash  ?  IV site on arm is red, patient reports this is improving.  NO shortness of breath reported. Patient is given dose of Benadryl when taking Morphine   ? Rosiglitazone Rash  ? ? ?Patient Measurements: ?Height: 5' (152.4 cm) ?Weight: 79.6 kg (175 lb 7.8 oz) ?IBW/kg (Calculated) : 45.5 ?Heparin Dosing Weight: 63.7 kg ? ?Vital Signs: ?Temp: 98.5 ?F (36.9 ?C) (03/02 1512) ?Temp Source: Oral (03/02 1512) ?BP: 148/62 (03/02 1400) ?Pulse Rate: 127 (03/02 1400) ? ?Labs: ?Recent Labs  ?  08/03/21 ?1657 08/04/21 ?1425 08/04/21 ?1425 08/05/21 ?4696 08/06/21 ?2952 08/06/21 ?1428  ?HGB  --  8.3*   < > 7.6* 8.0*  --   ?HCT  --  25.8*  --  24.0* 24.5*  --   ?PLT  --  188  --  183 163  --   ?LABPROT  --  48.3*   < > 54.0* 16.6* 16.3*  ?INR  --  5.3*   < > 6.1* 1.3* 1.3*  ?HEPARINUNFRC 0.30  --   --   --   --   --   ?CREATININE  --  1.42*   < > 1.35* 1.24* 1.11*  ? < > = values in this interval not displayed.  ? ? ? ?Estimated Creatinine Clearance: 50.3 mL/min (A) (by C-G formula based on SCr of 1.11 mg/dL (H)). ? ? ? ?Assessment: ?61 yo female from [facility] concern for aspiration pneumonia. On warfarin PTA with 3.5mg  and 4mg  alternating. INR supratherapeutic at 5.3 on admission and vitamin K 1mg  IV given. Heparin gtt started when INR <20 and then held for  supratherapeutic INR.  ? ?INR 6.1 on 3/1 and vitamin K 5mg  IV x1 given. INR 1.3 this AM and repeat INR 1.3 this afternoon. Pharmacy consulted to start enoxaparin treatment dosing. Hgb 8.0 Plt wnl. Some hematuria noted previously but resolved per RN. ? ?Goal of Therapy:  ?INR 2-3 ?Anti-Xa level 0.6-1 units/ml 4hrs after LMWH dose given ?Monitor platelets by anticoagulation protocol: Yes ?  ?Plan:  ?Start enoxaparin 80mg  SQ q12h  ?Follow up AM INR for plan to resume warfarin  ? ?Thank you for allowing pharmacy to be a part of this patient?s care. ? ?Cristela Felt, PharmD, BCPS ?Clinical Pharmacist ?08/06/2021 4:05 PM ? ? ? ? ?

## 2021-08-06 NOTE — Progress Notes (Addendum)
eLink Physician-Brief Progress Note ?Patient Name: Connie Ruiz ?DOB: 12/29/1960 ?MRN: [id number] ? ? ?Date of Service ? 08/06/2021  ?HPI/Events of Note ? Patient tachycardic to 140s. Reporting pain. Given tylenol without relief  ?eICU Interventions ? Trial low dose toradol 7.5 mg once ?RN to continue to monitor and notify elink as needed  ? ? ? ?Intervention Category ?Minor Interventions: Routine modifications to care plan (e.g. PRN medications for pain, fever) ? ?Kenora Spayd Rodman Pickle ?08/06/2021, 10:29 PM ?

## 2021-08-06 NOTE — Progress Notes (Signed)
NAME:  Connie Ruiz, MRN:  [id number], DOB:  [birth year], LOS: 4 ADMISSION DATE:  08/02/2021, CONSULTATION DATE:  08/02/2021 REFERRING MD:  Dr. Lorin Mercy, [facility], CHIEF COMPLAINT:  Gurgling respirations   History of Present Illness:  61 yo female from [facility] with fever 101.3.  SpO2 in 70s on room air.  Noted to have gurgling respirations and chest xray showed findings of aspiration pneumonia.  She has prior CVA and is non verbal.   She was not able to protect airway and required intubation.  She has hx of renal disease and previously on dialysis for 5 years.  She failed access multiple times and was transitioned to peritoneal dialysis.  She had renal transplant in November 2005.  She is followed by nephrology at [place].  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, SLE, s/p renal transplant, PE on coumadin, [name], Herpes zoster, Gout, HOCM, Enterococcal endocarditis, GERD, Urinary incontinence, Rectal bleeding  Significant Hospital Events: Including procedures, antibiotic start and stop dates in addition to other pertinent events   2/26 Admit, intubated, start antibiotics, PICC line  2/27 On vent, remains on Levophed 18mcg, Vanc, Cefepime  2/28 Extubated, did ok overnight, but less awake this morning and minimal UOP, lab unable to draw, on bipap 3/1 on Marissa and Bipap qhs INR 6.1 heparin held yesterday and not been on coumadin since admission  Interim History / Subjective:   Mental status better this AM, c/o epigastric pain and nausea.  No overnight events   Objective   Blood pressure (!) 144/75, pulse (!) 106, temperature 98.7 F (37.1 C), temperature source Oral, resp. rate 10, height 5' (1.524 m), weight 79.6 kg, SpO2 97 %.        Intake/Output Summary (Last 24 hours) at 08/06/2021 0800 Last data filed at 08/06/2021 0700 Gross per 24 hour  Intake 1012.03 ml  Output 700 ml  Net  312.03 ml    Filed Weights   08/03/21 0130 08/03/21 0834  Weight: 79.6 kg 79.6 kg    General:  elderly ill-appearing. In no acute distress. Is not verbal due to oxygen mask HEENT: pupils equal, scleral anicteric Neuro: awake, nodding appropriately to questions.  CV: tachycardic, regular rhythm. no m/r/g PULM:  rhonchi and wheezing bilaterally, on 7L mask Extremities: warm/dry, no edema  Skin: no rashes or lesions  Resolved Hospital Problem list   Septic Shock  Assessment & Plan:  Acute hypoxic respiratory failure with compromised airway in setting of aspiration pneumonia Extubated, mental and respiratory status are improving. She is off of Levophed. CXR with persistent LLL atelectasis vs consolidation and bedside POCUS with dense RLL consolidation - Continue Zosyn for 7 day (5/7) course. Blood culture negative to date - Continue schedule duonebs. Still has wheezing   Acute R facial droop No acute infarct on CT head  Hx of SLE s/p renal transplant. AKI Hypokalemia Followed by nephrology at [place]. Not on dialysis - appreciate nephrology consult - on chronic prednisone 5mg , tapering stress dose steroids - continue prograff - replete K. Mag and Phos ok.   Hx of PE on coumadin with elevated  INR. Antiphospholipid syndrome INR 1.3 today. Pending repeat INR to confirm  -Warfarin per pharmacy  Hypothyroidism  -Continue Levothyroxine  Protein Calorie Malnutrition  Patient did not pass swallow screen. Nutrition per tube feed.   Best Practice (right click and "Reselect all SmartList Selections" daily)  Diet/type: NPO, not yet passed swallow screen  DVT prophylaxis: SCD GI prophylaxis: PPI Lines: N/A Foley:  Yes, and it is still needed Code Status:  full code  Labs   CBC: Recent Labs  Lab 08/02/21 0511 08/02/21 1515 08/03/21 0135 08/03/21 0428 08/04/21 1425 08/05/21 0341 08/06/21 0318  WBC 10.4  --  24.2*  --  24.5* 23.0* 16.3*  NEUTROABS 6.6  --   --   --   22.6*  --   --   HGB 11.0*   < > 12.1 9.9* 8.3* 7.6* 8.0*  HCT 37.1   < > 40.5 29.0* 25.8* 24.0* 24.5*  MCV 97.4  --  94.8  --  89.0 89.2 87.5  PLT 166  --  157  --  188 183 163   < > = values in this interval not displayed.     Basic Metabolic Panel: Recent Labs  Lab 08/02/21 0511 08/02/21 1515 08/03/21 0428 08/03/21 1213 08/04/21 1425 08/05/21 0341 08/05/21 1645 08/06/21 0318  NA 140   < > 140 143 140 139  --  143  K 4.3   < > 3.7 5.3* 3.0* 3.2*  --  2.8*  CL 107  --   --  115* 108 108  --  108  CO2 20*  --   --  16* 21* 20*  --  23  GLUCOSE 68*  --   --  152* 137* 115*  --  179*  BUN 24*  --   --  29* 32* 30*  --  32*  CREATININE 1.07*  --   --  1.27* 1.42* 1.35*  --  1.24*  CALCIUM 8.0*  --   --  7.8* 8.0* 8.0*  --  8.3*  MG  --   --   --   --  1.1* 2.2 2.2 2.3  PHOS  --   --   --   --  3.8 3.3 2.6 2.4*   < > = values in this interval not displayed.    GFR: Estimated Creatinine Clearance: 45 mL/min (A) (by C-G formula based on SCr of 1.24 mg/dL (H)). Recent Labs  Lab 08/02/21 0511 08/03/21 0135 08/04/21 1425 08/05/21 0341 08/06/21 0318  WBC 10.4 24.2* 24.5* 23.0* 16.3*  LATICACIDVEN 0.8  --   --   --   --      Liver Function Tests: Recent Labs  Lab 08/02/21 0511 08/05/21 0341  AST 17 12*  ALT 10 11  ALKPHOS 48 34*  BILITOT 0.9 0.6  PROT 5.1* 4.9*  ALBUMIN 2.2* 2.1*    No results for input(s): LIPASE, AMYLASE in the last 168 hours. No results for input(s): AMMONIA in the last 168 hours.  ABG    Component Value Date/Time   PHART 7.242 (L) 08/03/2021 0428   PCO2ART 44.5 08/03/2021 0428   PO2ART 100 08/03/2021 0428   HCO3 19.2 (L) 08/03/2021 0428   TCO2 21 (L) 08/03/2021 0428   ACIDBASEDEF 8.0 (H) 08/03/2021 0428   O2SAT 97 08/03/2021 0428      Coagulation Profile: Recent Labs  Lab 08/02/21 0511 08/03/21 0135 08/04/21 1425 08/05/21 0341 08/06/21 0318  INR 5.3* 1.8* 5.3* 6.1* 1.3*     Cardiac Enzymes: No results for input(s):  CKTOTAL, CKMB, CKMBINDEX, TROPONINI in the last 168 hours.  HbA1C: Hgb A1c MFr Bld  Date/Time Value Ref Range Status  05/27/2021 06:20 AM 6.0 (H) 4.8 - 5.6 % Final    Comment:    (NOTE) Pre diabetes:  5.7%-6.4%  Diabetes:              >6.4%  Glycemic control for   <7.0% adults with diabetes   01/27/2021 10:09 AM 8.2 (H) 4.8 - 5.6 % Final    Comment:    (NOTE) Pre diabetes:          5.7%-6.4%  Diabetes:              >6.4%  Glycemic control for   <7.0% adults with diabetes     CBG: Recent Labs  Lab 08/05/21 1609 08/05/21 1954 08/05/21 2311 08/06/21 0317 08/06/21 0739  GLUCAP 126* 129* 173* 164* 204*     Review of Systems:   Unable to obtain  Past Medical History:  She,  has a past medical history of Anxiety, Candida esophagitis ([place]) (11/12/2014), CEREBROVASCULAR ACCIDENT, ACUTE (04/15/2010), CLOSTRIDIUM DIFFICILE COLITIS, HX OF (08/21/2007), CONGESTIVE HEART FAILURE (08/21/2007), Current use of long term anticoagulation, CVA (04/17/2010), Depression, DIABETES MELLITUS, TYPE II (08/21/2007), DVT, HX OF (08/21/2007), GERD (08/21/2007), GOUT (08/21/2007), History of stroke with residual effects, HYPERLIPIDEMIA (08/21/2007), HYPERTENSION (08/21/2007), KIDNEY TRANSPLANTATION, HX OF (08/22/2007), LUPUS (08/21/2007), OSTEOPOROSIS (08/21/2007), Pulmonary embolism ([place]) (07/16/2010), Renal failure, RENAL INSUFFICIENCY (08/21/2007), Right sided weakness, Steroid-induced hyperglycemia (11/09/2014), Tachycardia, and THYROID NODULE, 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   Surgical History:   Past Surgical History:  Procedure Laterality Date   BIOPSY  02/04/2021   Procedure: BIOPSY;  Surgeon: [name], MD;  Location: [facility];  Service: Endoscopy;;   CESAREAN SECTION     CHOLECYSTECTOMY     COLONOSCOPY  06/2000   [facility]    ENTEROSCOPY N/A 11/11/2014   Procedure: ENTEROSCOPY;  Surgeon: Ladene Artist, MD;  Location: [facility] ENDOSCOPY;  Service: Endoscopy;  Laterality: N/A;   FLEXIBLE SIGMOIDOSCOPY  N/A 02/04/2021   Procedure: FLEXIBLE SIGMOIDOSCOPY;  Surgeon: [name], MD;  Location: [facility] ENDOSCOPY;  Service: Endoscopy;  Laterality: N/A;   KIDNEY TRANSPLANT Right 2009   RENAL BIOPSY, OPEN  1981   TUBAL LIGATION       Social History:   reports that she has never smoked. She has never used smokeless tobacco. She reports that she does not drink alcohol and does not use drugs.   Family History:  Her family history includes Asthma in her daughter and sister; Cancer in her maternal grandfather and paternal grandfather; Heart attack in her mother; Heart disease in her father.   Allergies Allergies  Allergen Reactions   Oxycodone-Acetaminophen Shortness Of Breath and Nausea Only   Propoxyphene Nausea Only and Shortness Of Breath    States takes tylenol at home   Sulfonamide Derivatives Shortness Of Breath and Nausea Only   Codeine Nausea Only   Hydrocodone-Acetaminophen     unknown   Hydromorphone     On MAR   Other Other (See Comments)    No blood, Jehovaeh Witness    Sulfamethoxazole    Tape     Redness**PAPER TAPE OK**   Gabapentin Anxiety    twitching   Latex Rash   Metoprolol Rash   Morphine And Related Rash    IV site on arm is red, patient reports this is improving.  NO shortness of breath reported. Patient is given dose of Benadryl when taking Morphine    Rosiglitazone Rash     Home Medications  Prior to Admission medications   Medication Sig Start Date End Date Taking? Authorizing Provider  acetaminophen (TYLENOL) 500 MG tablet Take 1,000 mg by mouth every 6 (six) hours as needed for moderate pain.  Yes [provider]  alendronate (FOSAMAX) 70 MG tablet Take 70 mg by mouth every Saturday.   Yes [provider]  Amino Acids-Protein Hydrolys (FEEDING SUPPLEMENT, PRO-STAT SUGAR FREE 64,) LIQD Take 30 mLs by mouth in the morning and at bedtime.   Yes [provider]  amitriptyline (ELAVIL) 10 MG tablet Take 10 mg by mouth at bedtime.   Yes  [provider]  calcitRIOL (ROCALTROL) 0.25 MCG capsule TAKE 1 CAPSULE(0.25 MCG) BY MOUTH DAILY. NO REFILLS WITHOUT APPOINTMENT Patient taking differently: Take 0.25 mcg by mouth daily. 02/13/19  Yes Renato Shin, MD  carvedilol (COREG) 6.25 MG tablet Take 1 tablet (6.25 mg total) by mouth 2 (two) times daily with a meal. 02/13/21  Yes Little Ishikawa, MD  cholecalciferol (VITAMIN D3) 25 MCG (1000 UNIT) tablet Take 1,000 Units by mouth daily.   Yes [provider]  cholestyramine (QUESTRAN) 4 g packet Take 1 packet (4 g total) by mouth daily. 05/29/21 09/12/21 Yes Amin, Jeanella Flattery, MD  dicyclomine (BENTYL) 10 MG capsule Take 1 capsule (10 mg total) by mouth 3 (three) times daily before meals. 02/13/21  Yes Little Ishikawa, MD  diltiazem [facility]) 360 MG 24 hr capsule Take 1 capsule (360 mg total) by mouth daily. 06/27/19  Yes [name], [name], MD  donepezil (ARICEPT) 10 MG tablet Take 10 mg by mouth at bedtime.  11/18/16  Yes [provider]  esomeprazole (NEXIUM) 20 MG capsule TAKE 1 CAPSULE(20 MG) BY MOUTH DAILY Patient taking differently: Take 20 mg by mouth daily at 12 noon. 07/17/18  Yes Renato Shin, MD  famotidine (PEPCID) 20 MG tablet Take 20 mg by mouth 2 (two) times daily.   Yes [provider]  FLUoxetine (PROZAC) 20 MG capsule Take 20 mg by mouth daily.   Yes [provider]  furosemide (LASIX) 20 MG tablet Take 20 mg by mouth daily.   Yes [provider]  hydrocortisone (ANUSOL-HC) 25 MG suppository Place 1 suppository (25 mg total) rectally at bedtime. 05/29/21  Yes Amin, Ankit Chirag, MD  insulin lispro (HUMALOG) 100 UNIT/ML injection Inject 0-12 Units into the skin 3 (three) times daily before meals. Per Sliding Scale. If blood sugar is: 0-150: 0 units 151-200: 2 units 201-250: 4 units 251-300: 6 units 301-350: 8 units 351-400: 10 units 401-450: 12 units   Yes [provider]  lactose free nutrition (BOOST) LIQD  Take 237 mLs by mouth in the morning and at bedtime.   Yes [provider]  levothyroxine (SYNTHROID) 150 MCG tablet Take 1 tablet (150 mcg total) by mouth daily at 6 (six) AM. Patient taking differently: Take 150 mcg by mouth daily before breakfast. 02/14/21  Yes Little Ishikawa, MD  loperamide (IMODIUM) 2 MG capsule Take 2 mg by mouth every 6 (six) hours as needed for diarrhea or loose stools.   Yes [provider]  magnesium chloride (SLOW-MAG) 64 MG TBEC SR tablet Take 1 tablet by mouth daily.   Yes [provider]  mirtazapine (REMERON) 30 MG tablet Take 1 tablet (30 mg total) by mouth at bedtime. 07/28/15  Yes Jaffe, Adam R, DO  morphine (MSIR) 15 MG tablet Take 15 mg by mouth every 4 (four) hours as needed (pain).   Yes [provider]  Multiple Vitamin (MULTIVITAMIN) tablet Take 1 tablet by mouth daily.   Yes [provider]  ondansetron (ZOFRAN ODT) 4 MG disintegrating tablet 4mg  ODT q4 hours prn nausea/vomiting Patient taking differently: Take  4 mg by mouth every 4 (four) hours as needed for nausea or vomiting. 06/15/15  Yes Mesner, Corene Cornea, MD  potassium chloride SA (KLOR-CON M) 20 MEQ tablet Take 20 mEq by mouth daily.   Yes [provider]  predniSONE (DELTASONE) 5 MG tablet Take 1 tablet (5 mg total) by mouth daily with breakfast. Patient taking differently: Take 5 mg by mouth daily. Continuous 05/29/21  Yes Amin, Ankit Chirag, MD  PSYLLIUM PO Take 11.8 g by mouth daily. Reguloid 3g/5.4g   Yes [provider]  Skin Protectants, Misc. (EUCERIN) cream Apply 1 application topically every evening. To feet   Yes [provider]  tacrolimus (PROGRAF) 0.5 MG capsule Take 0.5 mg by mouth See admin instructions. 0.5mg  every 12 hours along with Tacrolimus 2mg  dose for a total dose of 2.5mg  every 12 hours. 04/17/21  Yes [provider]  tacrolimus (PROGRAF) 1 MG capsule Take 2 mg by mouth See admin instructions. 2mg   every 12 hours along with Tacrolimus 0.5mg  dose for a total dose of 2.5mg  every 12 hours. 01/15/18  Yes [provider]  nystatin (MYCOSTATIN/NYSTOP) powder Apply topically 2 (two) times daily. Patient not taking: Reported on 08/02/2021 05/29/21   Damita Lack, MD     Critical care time: 35 minutes     CRITICAL CARE Performed by: Gaylan Gerold   Total critical care time: 35 minutes  Critical care time was exclusive of separately billable procedures and treating other patients.  Critical care was necessary to treat or prevent imminent or life-threatening deterioration.  Critical care was time spent personally by me on the following activities: development of treatment plan with patient and/or surrogate as well as nursing, discussions with consultants, evaluation of patient's response to treatment, examination of patient, obtaining history from patient or surrogate, ordering and performing treatments and interventions, ordering and review of laboratory studies, ordering and review of radiographic studies, pulse oximetry and re-evaluation of patient's condition.   Otilio Carpen Gleason, PA-C [facility] See Amion for pager If no response to pager , please call [phone] until 7pm After 7:00 pm call Elink  [phone]

## 2021-08-06 NOTE — Plan of Care (Signed)

## 2021-08-06 NOTE — Progress Notes (Signed)
Admit: 08/02/2021 ?LOS: 4 ? ?55F s/p ddKT 2005 [facility] Tac/Pred with aspiration PNA, septic shock resolving, VDRF.  ? ?Subjective:  ?Had 0.7L UOP yesterday, SCr 1.2 ?Low K, repleted ? ?03/01 0701 - 03/02 0700 ?In: 1025 [I.V.:130; NG/GT:325.3; IV Piggyback:556.7] ?Out: 700 [Urine:700] ? ?Filed Weights  ? 08/03/21 0130 08/03/21 0834  ?Weight: 79.6 kg 79.6 kg  ? ? ?Scheduled Meds: ? chlorhexidine  15 mL Mouth Rinse BID  ? Chlorhexidine Gluconate Cloth  6 each Topical Daily  ? feeding supplement (PROSource TF)  45 mL Per Tube BID  ? [START ON 08/07/2021] hydrocortisone sod succinate (SOLU-CORTEF) inj  50 mg Intravenous Daily  ? Followed by  ? [START ON 08/09/2021] predniSONE  5 mg Per Tube Q breakfast  ? insulin aspart  0-9 Units Subcutaneous Q4H  ? ipratropium-albuterol  3 mL Nebulization TID  ? [START ON 08/07/2021] levothyroxine  150 mcg Per Tube Q0600  ? mouth rinse  15 mL Mouth Rinse q12n4p  ? pantoprazole sodium  40 mg Per Tube Daily  ? sodium chloride flush  3 mL Intravenous Q12H  ? tacrolimus  1 mg Sublingual BID  ? Warfarin - Pharmacist Dosing Inpatient   Does not apply [id number]  ? ?Continuous Infusions: ? sodium chloride Stopped (08/06/21 1000)  ? feeding supplement (OSMOLITE 1.5 CAL) 1,000 mL (08/05/21 1444)  ? piperacillin-tazobactam (ZOSYN)  IV 12.5 mL/hr at 08/06/21 0900  ? potassium chloride 10 mEq (08/06/21 0955)  ? potassium PHOSPHATE IVPB (in mmol) 15 mmol (08/06/21 1022)  ? ?PRN Meds:.acetaminophen **OR** acetaminophen, albuterol, docusate, lidocaine (PF), polyethylene glycol ? ?Current Labs: reviewed  ? ? ?Physical Exam:  Blood pressure (!) 141/84, pulse (!) 118, temperature 98.7 ?F (37.1 ?C), temperature source Oral, resp. rate 19, height 5' (1.524 m), weight 79.6 kg, SpO2 99 %. ?GEN: Chronically ill-appearing, in bed ?ENT: NCAT ?EYES: Sclera clear ?CV: Tacky, regular ?PULM: Coarse breath sounds bilaterally ?ABD: Soft, nontender ?SKIN: No rashes or lesions ?EXT: No peripheral edema ?NEURO: Aphasic, right-sided  paresis ?GU: Foley present ? ?A ?Resolving oliguria, mild AKI:  ?S/p dd KT 2005 [facility], Tac/Pred (MMF held), BL SCr < 1 ?Aspiration PNA: per CCM, Zosyn ?Shock, septic, resolved, off pressors ?Hx/o CVA with R sided paresis + aphasia ?DM2 post KT ?Hx/o SLE ?VDRF, improved, extubated ?Hx/o PE on warfarin ? ?P ?Stable GFR, mild AKI; resolving/stable ?Cont on IS, towards home pred and tac dosing ?Daily weights, Daily Renal Panel, Strict I/Os, Avoid nephrotoxins (NSAIDs, judicious IV Contrast) ?Will sign off for now.  Please call with any questions or concerns.  Pt does need follow up with nephrology and she receives her care at [facility] transplant program  ? ?Pearson Grippe MD ?08/06/2021, 10:43 AM ? ?Recent Labs  ?Lab 08/04/21 ?1425 08/05/21 ?8242 08/05/21 ?1645 08/06/21 ?0318  ?NA 140 139  --  143  ?K 3.0* 3.2*  --  2.8*  ?CL 108 108  --  108  ?CO2 21* 20*  --  23  ?GLUCOSE 137* 115*  --  179*  ?BUN 32* 30*  --  32*  ?CREATININE 1.42* 1.35*  --  1.24*  ?CALCIUM 8.0* 8.0*  --  8.3*  ?PHOS 3.8 3.3 2.6 2.4*  ? ? ?Recent Labs  ?Lab 08/02/21 ?3536 08/02/21 ?1515 08/04/21 ?1425 08/05/21 ?1443 08/06/21 ?0318  ?WBC 10.4   < > 24.5* 23.0* 16.3*  ?NEUTROABS 6.6  --  22.6*  --   --   ?HGB 11.0*   < > 8.3* 7.6* 8.0*  ?HCT 37.1   < >  25.8* 24.0* 24.5*  ?MCV 97.4   < > 89.0 89.2 87.5  ?PLT 166   < > 188 183 163  ? < > = values in this interval not displayed.  ? ? ? ? ? ? ? ? ? ? ?  ?

## 2021-08-06 NOTE — Plan of Care (Signed)
?  Problem: Activity: ?Goal: Ability to tolerate increased activity will improve ?Outcome: Progressing ?  ?Problem: Clinical Measurements: ?Goal: Ability to maintain a body temperature in the normal range will improve ?Outcome: Progressing ?  ?Problem: Respiratory: ?Goal: Ability to maintain adequate ventilation will improve ?Outcome: Progressing ?Goal: Ability to maintain a clear airway will improve ?Outcome: Progressing ?  ?Problem: Education: ?Goal: Knowledge of General Education information will improve ?Description: Including pain rating scale, medication(s)/side effects and non-pharmacologic comfort measures ?Outcome: Progressing ?  ?Problem: Health Behavior/Discharge Planning: ?Goal: Ability to manage health-related needs will improve ?Outcome: Progressing ?  ?Problem: Clinical Measurements: ?Goal: Ability to maintain clinical measurements within normal limits will improve ?Outcome: Progressing ?Goal: Will remain free from infection ?Outcome: Progressing ?Goal: Diagnostic test results will improve ?Outcome: Progressing ?Goal: Respiratory complications will improve ?Outcome: Progressing ?Goal: Cardiovascular complication will be avoided ?Outcome: Progressing ?  ?Problem: Activity: ?Goal: Risk for activity intolerance will decrease ?Outcome: Progressing ?  ?Problem: Nutrition: ?Goal: Adequate nutrition will be maintained ?Outcome: Progressing ?  ?Problem: Coping: ?Goal: Level of anxiety will decrease ?Outcome: Progressing ?  ?Problem: Elimination: ?Goal: Will not experience complications related to bowel motility ?Outcome: Progressing ?Goal: Will not experience complications related to urinary retention ?Outcome: Progressing ?  ?Problem: Safety: ?Goal: Ability to remain free from injury will improve ?Outcome: Progressing ?  ?Problem: Skin Integrity: ?Goal: Risk for impaired skin integrity will decrease ?Outcome: Progressing ?  ?Problem: Pain Managment: ?Goal: General experience of comfort will improve ?Outcome:  Not Progressing ? Pt c/o abdominal pain and nausea during the shift. Some relief with zofran, however no PRNs ordered. ?

## 2021-08-06 NOTE — Progress Notes (Signed)
eLink Physician-Brief Progress Note ?Patient Name: Connie Ruiz ?DOB: Mar 12, 1961 ?MRN: [id number] ? ? ?Date of Service ? 08/06/2021  ?HPI/Events of Note ? Patient reports nausea. Qtc 480  ?eICU Interventions ? Zofran 4 mg once ordered  ? ? ? ?Intervention Category ?Minor Interventions: Routine modifications to care plan (e.g. PRN medications for pain, fever) ? ?Mcdonald Reiling Rodman Pickle ?08/06/2021, 3:08 AM ?

## 2021-08-06 NOTE — TOC Progression Note (Addendum)
Transition of Care (TOC) - Initial/Assessment Note  ? ? ?Patient Details  ?Name: Connie Ruiz ?MRN: [id number] ?Date of Birth: 1960/11/07 ? ?Transition of Care (TOC) CM/SW Contact:    ?Connie Ruiz ?Phone Number: ?08/06/2021, 10:35 AM ? ?Clinical Narrative:                 ?CSW completed chart review.  Patient is from [facility].   CSW attempted to call Connie Ruiz with admissions at [facility] and the patient's daughter, Connie Ruiz, to receive more information, but there was no answer.  CSW left secure voicemails for both individuals. ? ?TOC will continue to follow.   ? ?11:09-  CSW spoke with the patient's daughter via phone.  The daughter informed CSW that the patient is from [facility] long term.   ? ?Patient Goals and CMS Choice ?  ?  ?  ? ?Expected Discharge Plan and Services ?  ?  ?  ?  ?  ?                ?  ?  ?  ?  ?  ?  ?  ?  ?  ?  ? ?Prior Living Arrangements/Services ?  ?  ?  ?       ?  ?  ?  ?  ? ?Activities of Daily Living ?  ?  ? ?Permission Sought/Granted ?  ?  ?   ?   ?   ?   ? ?Emotional Assessment ?  ?  ?  ?  ?  ?  ? ?Admission diagnosis:  Acute respiratory failure with hypoxia ([place]) [J96.01] ?Endotracheal tube present [Z97.8] ?Sepsis due to pneumonia ([place]) [J18.9, A41.9] ?Acute hypoxemic respiratory failure ([place]) [J96.01] ?Patient Active Problem List  ? Diagnosis Date Noted  ? Malnutrition of moderate degree 08/06/2021  ? Pressure injury of skin 08/03/2021  ? Septic shock ([place])   ? Sepsis due to pneumonia ([place]) 08/02/2021  ? Acute hypoxemic respiratory failure ([place]) 08/02/2021  ? Chronic kidney disease, stage 3a ([place]) 08/02/2021  ? Refusal of blood transfusions as patient is [name] 08/02/2021  ? AKI (acute kidney injury) ([place]) 05/26/2021  ? Rectal bleeding 05/26/2021  ? Anemia of chronic disease 05/26/2021  ? Prolonged QT interval 05/26/2021  ? Enteritis 01/27/2021  ? Hypothyroidism, 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  ? Hypothyroidism 01/14/2018  ? HLD (hyperlipidemia)  01/14/2018  ? CVA (cerebral vascular accident) ([place]) 01/12/2018  ? Chest pain 01/11/2018  ? Acute respiratory failure with hypoxia ([place]) 07/20/2017  ? Abdominal bloating 07/20/2017  ? Sinus tachycardia 07/20/2017  ? SLE (systemic lupus erythematosus related syndrome) ([place]) 07/20/2017  ? CAP (community acquired pneumonia) 07/16/2017  ? Screening examination for infectious disease 01/28/2016  ? Type 2 diabetes mellitus ([place]) 08/03/2015  ? Urinary tract infection 07/20/2015  ? Vascular dementia ([place]) 05/08/2015  ? Hemiparesis as late effect of cerebrovascular accident (CVA) ([place]) 05/08/2015  ? Aphasia as late effect of stroke 05/08/2015  ? C. difficile colitis 02/25/2015  ? Hypokalemia 02/25/2015  ? CKD (chronic kidney disease) 02/25/2015  ? Colitis 02/14/2015  ? Sepsis ([place]) 02/14/2015  ? Nausea vomiting and diarrhea 02/14/2015  ? Abdominal pain 02/14/2015  ? UTI (lower urinary tract infection) 02/14/2015  ? Abdominal pain, chronic, generalized 01/07/2015  ? Candida esophagitis ([place]) 11/12/2014  ? Abnormal CT of the abdomen   ? Steroid-induced hyperglycemia 11/09/2014  ? Hypomagnesemia 11/06/2014  ? Acute abdominal pain   ? Supratherapeutic INR 11/02/2014  ?  Jejunitis 11/02/2014  ? Myalgia and myositis 04/05/2014  ? Wellness examination 04/05/2014  ? Menopausal state 04/05/2014  ? Palpitations 08/31/2013  ? Hyperlipidemia 08/31/2013  ? Disorder of heart rhythm 08/13/2013  ? TIA (transient ischemic attack) 06/20/2013  ? Gout flare: R elbow and R shoulder 06/20/2013  ? Acute encephalopathy 06/14/2013  ? Rhabdomyolysis 06/14/2013  ? History of stroke with residual effects   ? Right sided weakness   ? Breast mass, left 04/30/2013  ? Contusion of knee, left 10/18/2012  ? Depression 10/05/2012  ? Encounter for long-term (current) use of other medications 10/04/2012  ? Multiple thyroid nodules 06/08/2012  ? Risk for falls 05/05/2012  ? Physical deconditioning 05/05/2012  ? Fever 04/27/2012  ? DVT (deep venous thrombosis)  ([place]) 06/17/2011  ? Expressive aphasia 06/17/2011  ? Dysphasia 06/11/2011  ? Incontinence of urine 06/11/2011  ? Hand pain, left 06/11/2011  ? Immunosuppressive management encounter following kidney transplant 05/20/2011  ? Hypertension goal BP (blood pressure) < 130/80 05/20/2011  ? History of renal transplant 05/20/2011  ? Arm pain, left 05/07/2011  ? Dysfunctional voiding of urine 04/28/2011  ? Urge incontinence 04/28/2011  ? Urinary urgency 04/28/2011  ? Routine general medical examination at a health care facility 04/13/2011  ? Anticoagulated 01/08/2011  ? Pulmonary embolism ([place]) 07/16/2010  ? Cerebral artery occlusion with cerebral infarction ([place]) 04/17/2010  ? CEREBROVASCULAR ACCIDENT, ACUTE 04/15/2010  ? THYROID NODULE, LEFT 04/10/2009  ? Cough 03/26/2009  ? ACUTE BRONCHITIS 08/22/2007  ? KIDNEY TRANSPLANTATION, HX OF 08/22/2007  ? Gout 08/21/2007  ? Essential hypertension 08/21/2007  ? Chronic diastolic congestive heart failure ([place]) 08/21/2007  ? GERD 08/21/2007  ? Disorder resulting from impaired renal function 08/21/2007  ? LUPUS 08/21/2007  ? Osteoporosis 08/21/2007  ? DVT, HX OF 08/21/2007  ? Enteritis due to Clostridium difficile 08/21/2007  ? ?PCP:  [name], [name], MD ?Pharmacy:   ?[facility]- [place], [place] - [address] ?[address] ?[place] [address] ?Phone: [phone] Fax: [phone] ? ? ? ? ?Social Determinants of Health (SDOH) Interventions ?  ? ?Readmission Risk Interventions ?Readmission Risk Prevention Plan 05/29/2021  ?Transportation Screening Complete  ?Medication Review Press photographer) Complete  ?PCP or Specialist appointment within 3-5 days of discharge Complete  ?[place] or Home Care Consult Complete  ?SW Recovery Care/Counseling Consult Complete  ?Palliative Care Screening Not Applicable  ?Skilled Nursing Facility Complete  ?Some recent data might be hidden  ? ? ? ?

## 2021-08-07 DIAGNOSIS — J9601 Acute respiratory failure with hypoxia: Secondary | ICD-10-CM | POA: Diagnosis not present

## 2021-08-07 DIAGNOSIS — E440 Moderate protein-calorie malnutrition: Secondary | ICD-10-CM | POA: Diagnosis not present

## 2021-08-07 DIAGNOSIS — N1831 Chronic kidney disease, stage 3a: Secondary | ICD-10-CM | POA: Diagnosis not present

## 2021-08-07 DIAGNOSIS — Z940 Kidney transplant status: Secondary | ICD-10-CM | POA: Diagnosis not present

## 2021-08-07 LAB — CULTURE, BLOOD (ROUTINE X 2)
Culture: NO GROWTH
Culture: NO GROWTH
Special Requests: ADEQUATE
Special Requests: ADEQUATE

## 2021-08-07 LAB — GLUCOSE, CAPILLARY
Glucose-Capillary: 248 mg/dL — ABNORMAL HIGH (ref 70–99)
Glucose-Capillary: 240 mg/dL — ABNORMAL HIGH (ref 70–99)
Glucose-Capillary: 245 mg/dL — ABNORMAL HIGH (ref 70–99)
Glucose-Capillary: 247 mg/dL — ABNORMAL HIGH (ref 70–99)
Glucose-Capillary: 278 mg/dL — ABNORMAL HIGH (ref 70–99)

## 2021-08-07 LAB — BASIC METABOLIC PANEL
Anion gap: 8 (ref 5–15)
BUN: 29 mg/dL — ABNORMAL HIGH (ref 6–20)
CO2: 22 mmol/L (ref 22–32)
Calcium: 8.2 mg/dL — ABNORMAL LOW (ref 8.9–10.3)
Chloride: 112 mmol/L — ABNORMAL HIGH (ref 98–111)
Creatinine, Ser: 1.07 mg/dL — ABNORMAL HIGH (ref 0.44–1.00)
GFR, Estimated: 59 mL/min — ABNORMAL LOW (ref >60)
Glucose, Bld: 283 mg/dL — ABNORMAL HIGH (ref 70–99)
Potassium: 2.8 mmol/L — ABNORMAL LOW (ref 3.5–5.1)
Sodium: 142 mmol/L (ref 135–145)

## 2021-08-07 LAB — PHOSPHORUS
Phosphorus: 1.9 mg/dL — ABNORMAL LOW (ref 2.5–4.6)
Phosphorus: 2.9 mg/dL (ref 2.5–4.6)

## 2021-08-07 LAB — CBC
HCT: 26.6 % — ABNORMAL LOW (ref 36.0–46.0)
Hemoglobin: 8.8 g/dL — ABNORMAL LOW (ref 12.0–15.0)
MCH: 29 pg (ref 26.0–34.0)
MCHC: 33.1 g/dL (ref 30.0–36.0)
MCV: 87.8 fL (ref 80.0–100.0)
Platelets: 170 10*3/uL (ref 150–400)
RBC: 3.03 MIL/uL — ABNORMAL LOW (ref 3.87–5.11)
RDW: 16.6 % — ABNORMAL HIGH (ref 11.5–15.5)
WBC: 10.9 10*3/uL — ABNORMAL HIGH (ref 4.0–10.5)
nRBC: 0.5 % — ABNORMAL HIGH (ref 0.0–0.2)

## 2021-08-07 LAB — PROTIME-INR
INR: 1.6 — ABNORMAL HIGH (ref 0.8–1.2)
Prothrombin Time: 18.8 seconds — ABNORMAL HIGH (ref 11.4–15.2)

## 2021-08-07 LAB — MAGNESIUM
Magnesium: 2.1 mg/dL (ref 1.7–2.4)
Magnesium: 2 mg/dL (ref 1.7–2.4)

## 2021-08-07 MED ORDER — CARVEDILOL 6.25 MG PO TABS
6.2500 mg | ORAL_TABLET | Freq: Two times a day (BID) | ORAL | Status: DC
  Administered 2021-08-07 (×2): 6.25 mg
  Filled 2021-08-07 (×2): qty 1

## 2021-08-07 MED ORDER — PROCHLORPERAZINE EDISYLATE 10 MG/2ML IJ SOLN
10.0000 mg | Freq: Four times a day (QID) | INTRAMUSCULAR | Status: DC | PRN
  Administered 2021-08-07 (×2): 10 mg via INTRAVENOUS
  Filled 2021-08-07 (×3): qty 2

## 2021-08-07 MED ORDER — POTASSIUM PHOSPHATES 15 MMOLE/5ML IV SOLN
30.0000 mmol | Freq: Once | INTRAVENOUS | Status: AC
  Administered 2021-08-07: 30 mmol via INTRAVENOUS
  Filled 2021-08-07: qty 10

## 2021-08-07 MED ORDER — FUROSEMIDE 10 MG/ML IJ SOLN
40.0000 mg | Freq: Once | INTRAMUSCULAR | Status: AC
  Administered 2021-08-07: 40 mg via INTRAVENOUS
  Filled 2021-08-07: qty 4

## 2021-08-07 MED ORDER — POTASSIUM CHLORIDE 20 MEQ PO PACK
40.0000 meq | PACK | Freq: Once | ORAL | Status: AC
  Administered 2021-08-07: 40 meq
  Filled 2021-08-07: qty 2

## 2021-08-07 MED ORDER — WARFARIN SODIUM 3 MG PO TABS
3.0000 mg | ORAL_TABLET | Freq: Once | ORAL | Status: AC
  Administered 2021-08-07: 3 mg via ORAL
  Filled 2021-08-07 (×2): qty 1

## 2021-08-07 MED ORDER — ALBUTEROL SULFATE (2.5 MG/3ML) 0.083% IN NEBU
2.5000 mg | INHALATION_SOLUTION | RESPIRATORY_TRACT | Status: DC | PRN
  Administered 2021-08-07 – 2021-08-08 (×2): 2.5 mg via RESPIRATORY_TRACT
  Filled 2021-08-07 (×3): qty 3

## 2021-08-07 MED ORDER — VITAMIN K 100 MCG PO TABS
100.0000 ug | ORAL_TABLET | Freq: Every evening | ORAL | Status: DC
  Filled 2021-08-07: qty 1

## 2021-08-07 MED ORDER — POTASSIUM CHLORIDE 10 MEQ/100ML IV SOLN
10.0000 meq | INTRAVENOUS | Status: AC
  Administered 2021-08-07 (×2): 10 meq via INTRAVENOUS
  Filled 2021-08-07: qty 100

## 2021-08-07 MED ORDER — KETOROLAC TROMETHAMINE 15 MG/ML IJ SOLN
7.5000 mg | Freq: Once | INTRAMUSCULAR | Status: AC
  Administered 2021-08-07: 7.5 mg via INTRAVENOUS
  Filled 2021-08-07: qty 1

## 2021-08-07 MED ORDER — PHYTONADIONE 100 MCG PO TABS
100.0000 ug | ORAL_TABLET | Freq: Every evening | ORAL | Status: DC
  Administered 2021-08-08 – 2021-08-09 (×3): 100 ug via ORAL
  Filled 2021-08-07 (×7): qty 1

## 2021-08-07 NOTE — Progress Notes (Addendum)
eLink Physician-Brief Progress Note ?Patient Name: Connie Ruiz ?DOB: [birth year] ?MRN: [id number] ? ? ?Date of Service ? 08/07/2021  ?HPI/Events of Note ? Patient continues to have nausea. QTC on monitor >550 ? ?Patient with >3 watery stools. WBC elevated but improving to 16K. Started on TF yesterday  ?eICU Interventions ? Order compazine PRN for nausea ?Obtain EKG for Qtc monitoring>>Qtc <538ms ?Order flexiseal  ? ? ? ?Intervention Category ?Minor Interventions: Other: ? ?Lazara Grieser Rodman Pickle ?08/07/2021, 1:14 AM ?

## 2021-08-07 NOTE — Progress Notes (Signed)
K+ 2.8, Phos 1.9 ?Replaced per protocol  ?

## 2021-08-07 NOTE — Evaluation (Signed)
Occupational Therapy Evaluation ?Patient Details ?Name: Connie Ruiz ?MRN: [id number] ?DOB: Oct 12, 1960 ?Today's Date: 08/07/2021 ? ? ?History of Present Illness 61 y.o. female who presented 08/02/21 from SNF with fever and hypoxia. Admitted with acute hypoxic respiratory failure likely due to aspiration pneumonia. ETT 2/26 - 2/27. S/p cortrak placement 3/1. PMH includes CVA with right-sided hemiparesis, DVT on Coumadin, kidney transplant, lupus, PE, expressive aphasia, DM2.  ? ?Clinical Impression ?  ?OT evaluation placed by MD for reassess due to weakness. PTA, pt was at [facility] and requiring Total A for Adls and lift OOB. Pt currently present at baseline and requiring Total A for ADLs and bed mobility. Feel pt is at baseline function. No acute care needs. Recommend dc to SNF once medically stable per physician.   ? ?Recommendations for follow up therapy are one component of a multi-disciplinary discharge planning process, led by the attending physician.  Recommendations may be updated based on patient status, additional functional criteria and insurance authorization.  ? ?Follow Up Recommendations ? Long-term institutional care without follow-up therapy  ?  ?Assistance Recommended at Discharge Frequent or constant Supervision/Assistance  ?Patient can return home with the following   ? ?  ?Functional Status Assessment ?    ?Equipment Recommendations ? None recommended by OT  ?  ?Recommendations for Other Services PT consult ? ? ?  ?Precautions / Restrictions Precautions ?Precautions: Fall ?Precaution Comments: R weakness from prior CVA ?Restrictions ?Weight Bearing Restrictions: No  ? ?  ? ?Mobility Bed Mobility ?Overal bed mobility: Needs Assistance ?Bed Mobility: Rolling ?Rolling: Total assist, +2 for physical assistance, +2 for safety/equipment ?  ?  ?  ?  ?General bed mobility comments: Pt able to reach partially to R with L UE, but otherwise required TAx2 to flex leg and roll either direction. ?  ? ?Transfers ?  ?  ?   ?  ?  ?  ?  ?  ?  ?General transfer comment: N/A, does not do at baseline ?  ? ?  ?Balance   ?  ?  ?  ?  ?  ?  ?  ?  ?  ?  ?  ?  ?  ?  ?  ?  ?  ?  ?   ? ?ADL either performed or assessed with clinical judgement  ? ?ADL Overall ADL's : At baseline ?  ?  ?  ?  ?  ?  ?  ?  ?  ?  ?  ?  ?  ?  ?  ?  ?  ?  ?  ?General ADL Comments: Total A for baseline  ? ? ? ?Vision   ?   ?   ?Perception   ?  ?Praxis   ?  ? ?Pertinent Vitals/Pain Pain Assessment ?Pain Assessment: Faces ?Faces Pain Scale: Hurts little more ?Pain Location: generalized grimacing with mobility ?Pain Descriptors / Indicators: Grimacing ?Pain Intervention(s): Monitored during session, Limited activity within patient's tolerance, Repositioned  ? ? ? ?Hand Dominance Left ?  ?Extremity/Trunk Assessment Upper Extremity Assessment ?Upper Extremity Assessment: RUE deficits/detail ?RUE Deficits / Details: edema and decreased active movement. ?RUE Coordination: decreased fine motor;decreased gross motor ?  ?Lower Extremity Assessment ?Lower Extremity Assessment: Defer to PT evaluation ?RLE Deficits / Details: Minimal muscle activation, unable to achieve full knee extension or ankle dorsiflexion actively ?LLE Deficits / Details: Generalized weakness, difficulty flexing hip against gravity ?  ?Cervical / Trunk Assessment ?Cervical / Trunk Assessment: Other exceptions ?Cervical / Trunk Exceptions:  increased body habitus ?  ?Communication Communication ?Communication: Expressive difficulties ?  ?Cognition Arousal/Alertness: Awake/alert ?Behavior During Therapy: Flat affect ?Overall Cognitive Status: Difficult to assess ?  ?  ?  ?  ?  ?  ?  ?  ?  ?  ?  ?  ?  ?  ?  ?  ?General Comments: Pt following simple commands with increased time. No family present and difficult to assess due to expressive deficits. Pt answering simple yes/no questions with head nods, pointing to objects she needs, and starting simple sentences. Feel pt is at baseline cognition. ?  ?  ?General  Comments  VSS on RA ? ?  ?Exercises   ?  ?Shoulder Instructions    ? ? ?Home Living Family/patient expects to be discharged to:: Skilled nursing facility ?  ?  ?  ?  ?  ?  ?  ?  ?  ?  ?  ?  ?  ?  ?  ?  ?Additional Comments: [place] ?  ? ?  ?Prior Functioning/Environment Prior Level of Function : Needs assist ?  ?  ?  ?  ?  ?  ?Mobility Comments: Pt reports being dependent on nursing for all mobility and hoyser lifts. Reports she has not stood or walked in ~6 months. ?ADLs Comments: Reports being dependent on nursing staff. ?  ? ?  ?  ?OT Problem List: Decreased activity tolerance;Impaired balance (sitting and/or standing);Decreased knowledge of use of DME or AE;Decreased knowledge of precautions ?  ?   ?OT Treatment/Interventions:    ?  ?OT Goals(Current goals can be found in the care plan section) Acute Rehab OT Goals ?Patient Stated Goal: Return to SNF ?OT Goal Formulation: All assessment and education complete, DC therapy  ?OT Frequency:   ?  ? ?Co-evaluation PT/OT/SLP Co-Evaluation/Treatment: Yes ?Reason for Co-Treatment: To address functional/ADL transfers;For patient/therapist safety ?PT goals addressed during session: Mobility/safety with mobility ?OT goals addressed during session: ADL's and self-care ?  ? ?  ?AM-PAC OT "6 Clicks" Daily Activity     ?Outcome Measure Help from another person eating meals?: Total ?Help from another person taking care of personal grooming?: Total ?Help from another person toileting, which includes using toliet, bedpan, or urinal?: Total ?Help from another person bathing (including washing, rinsing, drying)?: Total ?Help from another person to put on and taking off regular upper body clothing?: Total ?Help from another person to put on and taking off regular lower body clothing?: Total ?6 Click Score: 6 ?  ?End of Session Nurse Communication: Mobility status ? ?Activity Tolerance: Patient tolerated treatment well ?Patient left: in bed;with call bell/phone within reach;with  bed alarm set ? ?OT Visit Diagnosis: Muscle weakness (generalized) (M62.81)  ?              ?Time: 4540-9811 ?OT Time Calculation (min): 14 min ?Charges:  OT General Charges ?$OT Visit: 1 Visit ?OT Evaluation ?$OT Eval Moderate Complexity: 1 Mod ? ?Alysah Carton MSOT, OTR/L ?Acute Rehab ?Pager: [phone] ?Office: [phone] ? ?Teniola Tseng M Ilya Ess ?08/07/2021, 3:27 PM ?

## 2021-08-07 NOTE — Evaluation (Signed)
Physical Therapy Evaluation & Discharge ?Patient Details ?Name: Connie Ruiz ?MRN: [id number] ?DOB: 1961-05-12 ?Today's Date: 08/07/2021 ? ?History of Present Illness ? Pt is a 61 y.o. female who presented 08/02/21 from SNF with fever and hypoxia. Admitted with acute hypoxic respiratory failure likely due to aspiration pneumonia. ETT 2/26 - 2/27. S/p cortrak placement 3/1. PMH includes CVA with right-sided hemiparesis, DVT on Coumadin, kidney transplant, lupus, PE, expressive aphasia, DM2. ?  ?Clinical Impression ? Pt presents with condition above. Pt communicating primarily via head nods and hand gestures due to hx of expressive deficits from prior CVA. Pt reports she was dependent on nursing for all bed mobility, hoyer lift transfers, and ADLs at baseline. She reports she has not stood or walked for ~6 months. Currently, pt is at her baseline, thus no further acute PT needs identified. Pt also in agreement she is at her baseline. All education completed and questions answered. PT will sign off.   ?   ? ?Recommendations for follow up therapy are one component of a multi-disciplinary discharge planning process, led by the attending physician.  Recommendations may be updated based on patient status, additional functional criteria and insurance authorization. ? ?Follow Up Recommendations Skilled nursing-short term rehab (<3 hours/day) (return to her SNF for long-term care) ? ?  ?Assistance Recommended at Discharge Frequent or constant Supervision/Assistance  ?Patient can return home with the following ? A lot of help with walking and/or transfers;Two people to help with walking and/or transfers;A lot of help with bathing/dressing/bathroom;Two people to help with bathing/dressing/bathroom;Assistance with cooking/housework;Direct supervision/assist for medications management;Direct supervision/assist for financial management;Assist for transportation;Help with stairs or ramp for entrance ? ?  ?Equipment Recommendations None  recommended by PT  ?Recommendations for Other Services ?    ?  ?Functional Status Assessment Patient has not had a recent decline in their functional status  ? ?  ?Precautions / Restrictions Precautions ?Precautions: Fall ?Precaution Comments: R weakness from prior CVA ?Restrictions ?Weight Bearing Restrictions: No  ? ?  ? ?Mobility ? Bed Mobility ?Overal bed mobility: Needs Assistance ?Bed Mobility: Rolling ?Rolling: Total assist, +2 for physical assistance, +2 for safety/equipment ?  ?  ?  ?  ?General bed mobility comments: Pt able to reach partially to R with L UE, but otherwise required TAx2 to flex leg and roll either direction. ?  ? ?Transfers ?  ?  ?  ?  ?  ?  ?  ?  ?  ?General transfer comment: N/A, does not do at baseline ?  ? ?Ambulation/Gait ?  ?  ?  ?  ?  ?  ?  ?General Gait Details: N/A, does not do at baseline ? ?Stairs ?  ?  ?  ?  ?  ? ?Wheelchair Mobility ?  ? ?Modified Rankin (Stroke Patients Only) ?  ? ?  ? ?Balance Overall balance assessment: Needs assistance ?  ?  ?  ?  ?  ?  ?  ?  ?  ?  ?  ?  ?  ?  ?  ?  ?  ?  ?   ? ? ? ?Pertinent Vitals/Pain Pain Assessment ?Pain Assessment: Faces ?Faces Pain Scale: Hurts little more ?Pain Location: generalized grimacing with mobility ?Pain Descriptors / Indicators: Grimacing ?Pain Intervention(s): Monitored during session, Limited activity within patient's tolerance, Repositioned  ? ? ?Home Living Family/patient expects to be discharged to:: Skilled nursing facility ?  ?  ?  ?  ?  ?  ?  ?  ?  ?  Additional Comments: [place]  ?  ?Prior Function Prior Level of Function : Needs assist ?  ?  ?  ?  ?  ?  ?Mobility Comments: Pt reports being dependent on nursing for all mobility and hoyser lifts. Reports she has not stood or walked in ~6 months. ?ADLs Comments: Reports being dependent on nursing staff. ?  ? ? ?Hand Dominance  ? Dominant Hand: Left ? ?  ?Extremity/Trunk Assessment  ? Upper Extremity Assessment ?Upper Extremity Assessment: Defer to OT evaluation ?   ? ?Lower Extremity Assessment ?Lower Extremity Assessment: RLE deficits/detail;LLE deficits/detail ?RLE Deficits / Details: Minimal muscle activation, unable to achieve full knee extension or ankle dorsiflexion actively ?LLE Deficits / Details: Generalized weakness, difficulty flexing hip against gravity ?  ? ?Cervical / Trunk Assessment ?Cervical / Trunk Assessment: Other exceptions ?Cervical / Trunk Exceptions: increased body habitus  ?Communication  ? Communication: Expressive difficulties  ?Cognition Arousal/Alertness: Awake/alert ?Behavior During Therapy: Flat affect ?Overall Cognitive Status: Difficult to assess ?  ?  ?  ?  ?  ?  ?  ?  ?  ?  ?  ?  ?  ?  ?  ?  ?General Comments: Pt following simple commands with increased time. No family present and difficult to assess due to expressive deficits. Likely baseline though. ?  ?  ? ?  ?General Comments General comments (skin integrity, edema, etc.): VSS on RA ? ?  ?Exercises    ? ?Assessment/Plan  ?  ?PT Assessment Patient does not need any further PT services  ?PT Problem List   ? ?   ?  ?PT Treatment Interventions     ? ?PT Goals (Current goals can be found in the Care Plan section)  ?Acute Rehab PT Goals ?Patient Stated Goal: did not state ?PT Goal Formulation: All assessment and education complete, DC therapy ?Time For Goal Achievement: 08/08/21 ?Potential to Achieve Goals: Fair ? ?  ?Frequency   ?  ? ? ?Co-evaluation PT/OT/SLP Co-Evaluation/Treatment: Yes ?Reason for Co-Treatment: Complexity of the patient's impairments (multi-system involvement);For patient/therapist safety;To address functional/ADL transfers ?PT goals addressed during session: Mobility/safety with mobility ?  ?  ? ? ?  ?AM-PAC PT "6 Clicks" Mobility  ?Outcome Measure Help needed turning from your back to your side while in a flat bed without using bedrails?: Total ?Help needed moving from lying on your back to sitting on the side of a flat bed without using bedrails?: Total ?Help needed  moving to and from a bed to a chair (including a wheelchair)?: Total ?Help needed standing up from a chair using your arms (e.g., wheelchair or bedside chair)?: Total ?Help needed to walk in hospital room?: Total ?Help needed climbing 3-5 steps with a railing? : Total ?6 Click Score: 6 ? ?  ?End of Session   ?Activity Tolerance: Patient tolerated treatment well ?Patient left: in bed;with call bell/phone within reach;with bed alarm set ?Nurse Communication: Mobility status ?PT Visit Diagnosis: Other symptoms and signs involving the nervous system (R29.898) ?  ? ?Time: 1446-1500 ?PT Time Calculation (min) (ACUTE ONLY): 14 min ? ? ?Charges:   PT Evaluation ?$PT Eval Moderate Complexity: 1 Mod ?  ?  ?   ? ? ?Moishe Spice, PT, DPT ?Acute Rehabilitation Services  ?Pager: [phone] ?Office: [phone] ? ? ?Maretta Bees Pettis ?08/07/2021, 3:14 PM ? ?

## 2021-08-07 NOTE — Progress Notes (Signed)
eLink Physician-Brief Progress Note ?Patient Name: Connie Ruiz ?DOB: March 29, 1961 ?MRN: [id number] ? ? ?Date of Service ? 08/07/2021  ?HPI/Events of Note ? Patient with wheezing not relieved by albuterol. ?CXR 3/1/2 with bilateral pleural effusions R>L. Cumulative fluid balance +6L since admission. Cr 1.11 ? ?On exam, pt in NAD, on 1L O2  ?eICU Interventions ? Diurese lasix 40 mg once. Primary team to reassess for repeat diuresis +/- CXR  ? ? ? ?Intervention Category ?Intermediate Interventions: Bronchospasm - evaluation and treatment ? ?Lalo Tromp Rodman Pickle ?08/07/2021, 3:53 AM ?

## 2021-08-07 NOTE — Progress Notes (Signed)
ANTICOAGULATION CONSULT NOTE - Follow Up Consult ? ?Pharmacy Consult for Enoxaparin/Warfarin ?Indication: history of pulmonary embolus ? ?Allergies  ?Allergen Reactions  ? Oxycodone-Acetaminophen Shortness Of Breath and Nausea Only  ? Propoxyphene Nausea Only and Shortness Of Breath  ?  States takes tylenol at home  ? Sulfonamide Derivatives Shortness Of Breath and Nausea Only  ? Codeine Nausea Only  ? Hydrocodone-Acetaminophen   ?  unknown  ? Hydromorphone   ?  On MAR  ? Other Other (See Comments)  ?  No blood, Jehovaeh Witness   ? Sulfamethoxazole   ? Tape   ?  Redness**PAPER TAPE OK**  ? Gabapentin Anxiety  ?  twitching  ? Latex Rash  ? Metoprolol Rash  ? Morphine And Related Rash  ?  IV site on arm is red, patient reports this is improving.  NO shortness of breath reported. Patient is given dose of Benadryl when taking Morphine   ? Rosiglitazone Rash  ? ? ?Patient Measurements: ?Height: 5' (152.4 cm) ?Weight: 79.6 kg (175 lb 7.8 oz) ?IBW/kg (Calculated) : 45.5 ?Heparin Dosing Weight: 63.7 kg ? ?Vital Signs: ?Temp: 98.9 ?F (37.2 ?C) (03/03 2703) ?Temp Source: Oral (03/03 5009) ?BP: 122/78 (03/03 0600) ?Pulse Rate: 129 (03/03 0600) ? ?Labs: ?Recent Labs  ?  08/05/21 ?3818 08/06/21 ?2993 08/06/21 ?1428 08/07/21 ?0354  ?HGB 7.6* 8.0*  --  8.8*  ?HCT 24.0* 24.5*  --  26.6*  ?PLT 183 163  --  170  ?LABPROT 54.0* 16.6* 16.3* 18.8*  ?INR 6.1* 1.3* 1.3* 1.6*  ?CREATININE 1.35* 1.24* 1.11* 1.07*  ? ? ? ?Estimated Creatinine Clearance: 52.2 mL/min (A) (by C-G formula based on SCr of 1.07 mg/dL (H)). ? ? ? ?Assessment: ?61 yo female from [facility] concern for aspiration pneumonia. On warfarin PTA with 3.5mg  and 4mg  alternating. INR supratherapeutic at 5.3 on admission and vitamin K 1mg  IV given. Heparin gtt started when INR <20 and then held for supratherapeutic INR.  ? ?INR 6.1 on 3/1 and vitamin K 5mg  IV x1 given. INR 1.6 this AM with no warfarin given. Pharmacy consulted to start enoxaparin treatment dosing. Hgb 8.0  Plt wnl. Some hematuria noted previously but resolved per RN. ? ?Given her labile INR without getting any warfarin over the past 5 days and her other signs of nutritional depletion/refeeding syndrome. Would recommend starting a daily Vit K low - dose and titrate warfarin up with this added. ? ?Goal of Therapy:  ?INR 2-3 ?Anti-Xa level 0.6-1 units/ml 4hrs after LMWH dose given ?Monitor platelets by anticoagulation protocol: Yes ?  ?Plan:  ?Continue enoxaparin 80mg  SQ q12h ?Start Vitamin K 100 mcg po qday ?Warfarin 3 mg po x 1 today.  ?Follow up AM INR for plan to resume warfarin  ? ?Thank you for allowing pharmacy to be a part of this patient?s care. ? ?Alanda Slim, PharmD, FCCM ?Clinical Pharmacist ?Please see AMION for all Pharmacists' Contact Phone Numbers ?08/07/2021, 9:22 AM  ? ? ? ? ? ?

## 2021-08-07 NOTE — Progress Notes (Addendum)
PROGRESS NOTE        PATIENT DETAILS Name: Connie Ruiz Age: 61 y.o. Sex: female Date of Birth: [birth year] Admit Date: 08/02/2021 Admitting Physician Chesley Mires, MD OXB:[id number], Malka So, MD  Brief Summary: Patient is a 61 y.o.  female with history of kidney transplant, VTE/antiphospholipid syndrome on chronic Coumadin therapy, CVA with residual aphasia-slight right-sided deficits-resident of SNF who presented to the hospital with acute respiratory distress and fever, she was intubated in the emergency room-and managed in the ICU.  She was subsequently stabilized-extubated on 2/28-unfortunately-she required insertion of NG tube for feedings.  Her hypoxia gradually improved-and patient was transferred to [facility] service on 3/3.  See below for further details.  Significant Hospital events: 2/26>> admit to ICU-intubated. 2/27>> On vent, remains on Levophed 65mcg, Vanc, Cefepime  2/28>> Extubated 3/03>> transfer to [facility].    Significant imaging studies: 2/26>> CXR: RLL infiltrate. 2/26>> CT head: No acute intracranial findings, chronic left MCA territory infarct. 3/01>> CT head: No acute intracranial abnormalities. 3/01>> RUQ ultrasound: Subtle nodularity of the liver-nonspecific-could reflect early cirrhosis.  Significant microbiology data: 2/26>> COVID/influenza PCR: Negative 2/26>> blood culture: Negative 2/27>> urine culture: Negative  Procedures: ETT>>2/26-2/28  Consults:  CCM, nephrology  Subjective: Lying comfortably in bed-denies any chest pain or shortness of breath.  She is nonverbal at baseline but answers questions by shaking her head.  Objective: Vitals: Blood pressure (!) 152/105, pulse (!) 121, temperature 98.4 F (36.9 C), temperature source Oral, resp. rate 18, height 5' (1.524 m), weight 79.6 kg, SpO2 96 %.   Exam: Gen Exam:Alert awake-not in any distress HEENT:atraumatic, normocephalic Chest: Moving air-scattered wheezing. CVS:S1S2  regular Abdomen:soft non tender, non distended Extremities:no edema Neurology: Difficult exam but appears to have slight right-sided weakness. Skin: no rash  Pertinent Labs/Radiology: CBC Latest Ref Rng & Units 08/07/2021 08/06/2021 08/05/2021  WBC 4.0 - 10.5 K/uL 10.9(H) 16.3(H) 23.0(H)  Hemoglobin 12.0 - 15.0 g/dL 8.8(L) 8.0(L) 7.6(L)  Hematocrit 36.0 - 46.0 % 26.6(L) 24.5(L) 24.0(L)  Platelets 150 - 400 K/uL 170 163 183    Lab Results  Component Value Date   NA 142 08/07/2021   K 2.8 (L) 08/07/2021   CL 112 (H) 08/07/2021   CO2 22 08/07/2021      Assessment/Plan: Acute hypoxic respiratory failure likely due to aspiration pneumonia: Hypoxia has improved-she is on room air-cultures negative-remains on Zosyn.  Septic shock (present on admission): Due to PNA-shock physiology has resolved-BP stable.  On Zosyn-cultures negative to date.  AKI-on CKD stage IIIa-history of renal transplant: AKI likely hemodynamically mediated-renal function has significantly improved on stress dose steroids, tacrolimus.  Nephrology has signed off.  Follow renal function periodically.  Supratherapeutic INR on admission: Given vitamin K-see below.  Dysphagia: Likely chronic-worsened due to severe debility from acute illness-cortak tube in place-feedings ongoing.  Await SLP reevaluation to see if patient is appropriate for initiation of oral intake.  Hypokalemia/hypophosphatemia: Replete and recheck.  Normocytic anemia: Likely due to critical illness-no evidence of blood loss-follow closely-patient is a [name].  DM-2 (A1c 6.0 on 05/27/2021) with steroid-induced hyperglycemia: Continue SSI-steroids being tapered down-CBGs should improve.  Recent Labs    08/07/21 0354 08/07/21 0720 08/07/21 1150  GLUCAP 248* 240* 245*     HTN: BP on the higher side-resuming low-dose Coreg.  Follow and adjust accordingly.  History of PE-antiphospholipid syndrome: INR subtherapeutic-on overlapping  Coumadin/Lovenox.  Pharmacy following.  History of multiple CVA's with chronic aphasia/right-sided weakness: At baseline-CT head x2 negative for acute abnormalities.  Resident of SNF.  Vascular dementia: We will resume Aricept, Prozac, mirtazapine and amitriptyline over the next few days.  Hypothyroidism: On levothyroxine.  Refusal of blood transfusions as patient is [name]  Debility/Deconditioning: Chronically frail and weak-Per daughter-patient has been very minimally ambulatory for the past 1 year and is mostly bed to wheelchair bound.  Her last CVA was 8 years ago.  Obtaining PT/OT has she appears to be much more weaker than her usual baseline.   Nutrition Status: Nutrition Problem: Moderate Malnutrition Etiology: chronic illness (CHF, CVA) Signs/Symptoms: mild fat depletion, moderate muscle depletion, percent weight loss (16% weight loss in 6 months) Percent weight loss: 16 % (6 months) Interventions: Tube feeding, Prostat   BMI: Estimated body mass index is 34.27 kg/m as calculated from the following:   Height as of this encounter: 5' (1.524 m).   Weight as of this encounter: 79.6 kg.   Code status:   Code Status: Full Code   DVT Prophylaxis: Place and maintain sequential compression device Start: 08/02/21 1242 SCDs Start: 08/02/21 1209 warfarin (COUMADIN) tablet 3 mg     Family Communication: Daughter Connie Ruiz  [phone] 3/3   Disposition Plan: Status is: Inpatient Remains inpatient appropriate because: Resolving septic shock due to aspiration pneumonia-remains n.p.o. due to severe dysphagia.  On NG tube feedings.    Planned Discharge Destination:Skilled nursing facility   Diet: Diet Order             Diet NPO time specified  Diet effective now                     Antimicrobial agents: Anti-infectives (From admission, onward)    Start     Dose/Rate Route Frequency Ordered Stop   08/03/21 1500  vancomycin (VANCOREADY) IVPB 750  mg/150 mL  Status:  Discontinued        750 mg 150 mL/hr over 60 Minutes Intravenous Every 24 hours 08/02/21 1456 08/03/21 0912   08/03/21 0630  cefTRIAXone (ROCEPHIN) 2 g in sodium chloride 0.9 % 100 mL IVPB  Status:  Discontinued        2 g 200 mL/hr over 30 Minutes Intravenous Every 24 hours 08/02/21 1209 08/02/21 1246   08/02/21 2100  piperacillin-tazobactam (ZOSYN) IVPB 3.375 g       See Hyperspace for full Linked Orders Report.   3.375 g 12.5 mL/hr over 240 Minutes Intravenous Every 8 hours 08/02/21 1253 08/08/21 2359   08/02/21 1345  vancomycin (VANCOREADY) IVPB 1750 mg/350 mL        1,750 mg 175 mL/hr over 120 Minutes Intravenous  Once 08/02/21 1343 08/02/21 1721   08/02/21 1300  vancomycin (VANCOREADY) IVPB 2000 mg/400 mL  Status:  Discontinued        2,000 mg 200 mL/hr over 120 Minutes Intravenous  Once 08/02/21 1253 08/02/21 1343   08/02/21 1300  piperacillin-tazobactam (ZOSYN) IVPB 3.375 g       See Hyperspace for full Linked Orders Report.   3.375 g 100 mL/hr over 30 Minutes Intravenous  Once 08/02/21 1253 08/02/21 1555   08/02/21 0630  cefTRIAXone (ROCEPHIN) 2 g in sodium chloride 0.9 % 100 mL IVPB        2 g 200 mL/hr over 30 Minutes Intravenous  Once 08/02/21 0618 08/02/21 0807   08/02/21 0630  azithromycin (ZITHROMAX) 500 mg in sodium chloride 0.9 %  250 mL IVPB  Status:  Discontinued        500 mg 250 mL/hr over 60 Minutes Intravenous Every 24 hours 08/02/21 0618 08/02/21 1246        MEDICATIONS: Scheduled Meds:  carvedilol  6.25 mg Per Tube BID WC   chlorhexidine  15 mL Mouth Rinse BID   Chlorhexidine Gluconate Cloth  6 each Topical Daily   enoxaparin (LOVENOX) injection  1 mg/kg Subcutaneous Q12H   feeding supplement (PROSource TF)  45 mL Per Tube BID   hydrocortisone sod succinate (SOLU-CORTEF) inj  50 mg Intravenous Daily   Followed by   Derrill Memo ON 08/09/2021] predniSONE  5 mg Per Tube Q breakfast   insulin aspart  0-9 Units Subcutaneous Q4H    ipratropium-albuterol  3 mL Nebulization TID   levothyroxine  150 mcg Per Tube Q0600   mouth rinse  15 mL Mouth Rinse q12n4p   metoCLOPramide  5 mg Per Tube Q8H   pantoprazole sodium  40 mg Per Tube Daily   sodium chloride flush  3 mL Intravenous Q12H   tacrolimus  1 mg Sublingual BID   vitamin k  100 mcg Oral QPM   warfarin  3 mg Oral ONCE-1600   Warfarin - Pharmacist Dosing Inpatient   Does not apply q1600   Continuous Infusions:  sodium chloride Stopped (08/06/21 1000)   feeding supplement (OSMOLITE 1.5 CAL) 30 mL/hr at 08/06/21 1115   piperacillin-tazobactam (ZOSYN)  IV 12.5 mL/hr at 08/07/21 0600   potassium PHOSPHATE IVPB (in mmol) 30 mmol (08/07/21 0619)   PRN Meds:.acetaminophen **OR** acetaminophen, albuterol, docusate, lidocaine (PF), polyethylene glycol, prochlorperazine   I have personally reviewed following labs and imaging studies  LABORATORY DATA: CBC: Recent Labs  Lab 08/02/21 0511 08/02/21 1515 08/03/21 0135 08/03/21 0428 08/04/21 1425 08/05/21 0341 08/06/21 0318 08/07/21 0354  WBC 10.4  --  24.2*  --  24.5* 23.0* 16.3* 10.9*  NEUTROABS 6.6  --   --   --  22.6*  --   --   --   HGB 11.0*   < > 12.1 9.9* 8.3* 7.6* 8.0* 8.8*  HCT 37.1   < > 40.5 29.0* 25.8* 24.0* 24.5* 26.6*  MCV 97.4  --  94.8  --  89.0 89.2 87.5 87.8  PLT 166  --  157  --  188 183 163 170   < > = values in this interval not displayed.    Basic Metabolic Panel: Recent Labs  Lab 08/04/21 1425 08/05/21 0341 08/05/21 1645 08/06/21 0318 08/06/21 1428 08/07/21 0354  NA 140 139  --  143 141 142  K 3.0* 3.2*  --  2.8* 3.7 2.8*  CL 108 108  --  108 110 112*  CO2 21* 20*  --  23 22 22   GLUCOSE 137* 115*  --  179* 234* 283*  BUN 32* 30*  --  32* 29* 29*  CREATININE 1.42* 1.35*  --  1.24* 1.11* 1.07*  CALCIUM 8.0* 8.0*  --  8.3* 8.4* 8.2*  MG 1.1* 2.2 2.2 2.3 2.2 2.1  PHOS 3.8 3.3 2.6 2.4* 2.7 1.9*    GFR: Estimated Creatinine Clearance: 52.2 mL/min (A) (by C-G formula based on SCr  of 1.07 mg/dL (H)).  Liver Function Tests: Recent Labs  Lab 08/02/21 0511 08/05/21 0341  AST 17 12*  ALT 10 11  ALKPHOS 48 34*  BILITOT 0.9 0.6  PROT 5.1* 4.9*  ALBUMIN 2.2* 2.1*   No results for input(s): LIPASE, AMYLASE in the  last 168 hours. No results for input(s): AMMONIA in the last 168 hours.  Coagulation Profile: Recent Labs  Lab 08/04/21 1425 08/05/21 0341 08/06/21 0318 08/06/21 1428 08/07/21 0354  INR 5.3* 6.1* 1.3* 1.3* 1.6*    Cardiac Enzymes: No results for input(s): CKTOTAL, CKMB, CKMBINDEX, TROPONINI in the last 168 hours.  BNP (last 3 results) No results for input(s): PROBNP in the last 8760 hours.  Lipid Profile: No results for input(s): CHOL, HDL, LDLCALC, TRIG, CHOLHDL, LDLDIRECT in the last 72 hours.  Thyroid Function Tests: No results for input(s): TSH, T4TOTAL, FREET4, T3FREE, THYROIDAB in the last 72 hours.  Anemia Panel: No results for input(s): VITAMINB12, FOLATE, FERRITIN, TIBC, IRON, RETICCTPCT in the last 72 hours.  Urine analysis:    Component Value Date/Time   COLORURINE YELLOW 08/03/2021 0032   APPEARANCEUR CLOUDY (A) 08/03/2021 0032   LABSPEC 1.029 08/03/2021 0032   PHURINE 5.0 08/03/2021 0032   GLUCOSEU NEGATIVE 08/03/2021 0032   GLUCOSEU NEGATIVE 10/22/2013 1621   HGBUR NEGATIVE 08/03/2021 0032   BILIRUBINUR NEGATIVE 08/03/2021 0032   KETONESUR 5 (A) 08/03/2021 0032   PROTEINUR 30 (A) 08/03/2021 0032   UROBILINOGEN 1.0 03/27/2015 1534   NITRITE NEGATIVE 08/03/2021 0032   LEUKOCYTESUR TRACE (A) 08/03/2021 0032    Sepsis Labs: Lactic Acid, Venous    Component Value Date/Time   LATICACIDVEN 0.8 08/02/2021 0511    MICROBIOLOGY: Recent Results (from the past 240 hour(s))  Blood Culture (routine x 2)     Status: None   Collection Time: 08/02/21  6:04 AM   Specimen: BLOOD LEFT ARM  Result Value Ref Range Status   Specimen Description BLOOD LEFT ARM  Final   Special Requests   Final    BOTTLES DRAWN AEROBIC AND  ANAEROBIC Blood Culture adequate volume   Culture   Final    NO GROWTH 5 DAYS Performed at [facility], [address], [place], [place] [address]    Report Status 08/07/2021 FINAL  Final  Blood Culture (routine x 2)     Status: None   Collection Time: 08/02/21  6:05 AM   Specimen: BLOOD LEFT HAND  Result Value Ref Range Status   Specimen Description BLOOD LEFT HAND  Final   Special Requests AEROBIC BOTTLE ONLY Blood Culture adequate volume  Final   Culture   Final    NO GROWTH 5 DAYS Performed at [facility], [place] [address] [place], [place] [address]    Report Status 08/07/2021 FINAL  Final  MRSA Next Gen by PCR, Nasal     Status: None   Collection Time: 08/02/21 12:40 PM   Specimen: Nasal Mucosa; Nasal Swab  Result Value Ref Range Status   MRSA by PCR Next Gen NOT DETECTED NOT DETECTED Final    Comment: (NOTE) The GeneXpert MRSA Assay (FDA approved for NASAL specimens only), is one component of a comprehensive MRSA colonization surveillance program. It is not intended to diagnose MRSA infection nor to guide or monitor treatment for MRSA infections. Test performance is not FDA approved in patients less than 76 years old. Performed at [facility], [place] [address] [place], [place] [address]   Resp Panel by RT-PCR (Flu A&B, Covid) Nasopharyngeal Swab     Status: None   Collection Time: 08/02/21 12:51 PM   Specimen: Nasopharyngeal Swab; Nasopharyngeal(NP) swabs in vial transport medium  Result Value Ref Range Status   SARS Coronavirus 2 by RT PCR NEGATIVE NEGATIVE Final    Comment: (  NOTE) SARS-CoV-2 target nucleic acids are NOT DETECTED.  The SARS-CoV-2 RNA is generally detectable in upper respiratory specimens during the acute phase of infection. The lowest concentration of SARS-CoV-2 viral copies this assay can detect is 138 copies/mL. A negative result does not preclude SARS-Cov-2 infection and should not be used as the sole basis for treatment  or other patient management decisions. A negative result may occur with  improper specimen collection/handling, submission of specimen other than nasopharyngeal swab, presence of viral mutation(s) within the areas targeted by this assay, and inadequate number of viral copies(<138 copies/mL). A negative result must be combined with clinical observations, patient history, and epidemiological information. The expected result is Negative.  Fact Sheet for Patients:  EntrepreneurPulse.com.au  Fact Sheet for Healthcare Providers:  IncredibleEmployment.be  This test is no t yet approved or cleared by the [place] FDA and  has been authorized for detection and/or diagnosis of SARS-CoV-2 by FDA under an Emergency Use Authorization (EUA). This EUA will remain  in effect (meaning this test can be used) for the duration of the COVID-19 declaration under Section 564(b)(1) of the Act, 21 U.S.C.section 360bbb-3(b)(1), unless the authorization is terminated  or revoked sooner.       Influenza A by PCR NEGATIVE NEGATIVE Final   Influenza B by PCR NEGATIVE NEGATIVE Final    Comment: (NOTE) The Xpert Xpress SARS-CoV-2/FLU/RSV plus assay is intended as an aid in the diagnosis of influenza from Nasopharyngeal swab specimens and should not be used as a sole basis for treatment. Nasal washings and aspirates are unacceptable for Xpert Xpress SARS-CoV-2/FLU/RSV testing.  Fact Sheet for Patients: EntrepreneurPulse.com.au  Fact Sheet for Healthcare Providers: IncredibleEmployment.be  This test is not yet approved or cleared by the [place] FDA and has been authorized for detection and/or diagnosis of SARS-CoV-2 by FDA under an Emergency Use Authorization (EUA). This EUA will remain in effect (meaning this test can be used) for the duration of the COVID-19 declaration under Section 564(b)(1) of the Act, 21 U.S.C. section  360bbb-3(b)(1), unless the authorization is terminated or revoked.  Performed at [facility], [place] [address], [place], [place] [address]   Urine Culture     Status: None   Collection Time: 08/03/21 12:21 AM   Specimen: In/Out Cath Urine  Result Value Ref Range Status   Specimen Description IN/OUT CATH URINE  Final   Special Requests NONE  Final   Culture   Final    NO GROWTH Performed at [facility], [place] [address] [place], [place] [address]    Report Status 08/04/2021 FINAL  Final    RADIOLOGY STUDIES/RESULTS: CT HEAD WO CONTRAST (5MM)  Result Date: 08/05/2021 CLINICAL DATA:  Acute neurological deficit, suspected stroke EXAM: CT HEAD WITHOUT CONTRAST TECHNIQUE: Contiguous axial images were obtained from the base of the skull through the vertex without intravenous contrast. RADIATION DOSE REDUCTION: This exam was performed according to the departmental dose-optimization program which includes automated exposure control, adjustment of the mA and/or kV according to patient size and/or use of iterative reconstruction technique. COMPARISON:  08/02/2021 FINDINGS: Brain: Generalized atrophy. Ex vacuo dilatation of the ventricular system particularly frontal horn of LEFT lateral ventricle. No midline shift or mass effect. Small vessel chronic ischemic changes of deep cerebral white matter. Old infarcts BILATERAL cerebellar hemispheres and LEFT MCA territory involving LEFT basal ganglia, LEFT frontal lobe and LEFT temporal lobe. Appearance is unchanged since the prior study. No intracranial hemorrhage, mass lesion, evidence of acute infarction,  or extra-axial fluid collection. Vascular: No hyperdense vessels. Atherosclerotic calcifications present in internal carotid and vertebral arteries bilaterally Skull: Intact Sinuses/Orbits: Scattered mucosal thickening and fluid throughout the paranasal sinuses and mastoid air cells Other: N/A IMPRESSION: Atrophy with small vessel chronic ischemic  changes of deep cerebral white matter. Old infarcts BILATERAL cerebellar hemispheres and LEFT MCA territory. No acute intracranial abnormalities. Electronically Signed   By: Lavonia Dana M.D.   On: 08/05/2021 12:44   DG Abd Portable 1V  Result Date: 08/05/2021 CLINICAL DATA:  Feeding tube placement EXAM: PORTABLE ABDOMEN - 1 VIEW COMPARISON:  02/03/2021 FINDINGS: Tip of enteric tube is seen in the distal antrum or pylorus of the stomach. Bowel gas pattern is nonspecific. Increased markings are seen in the lower lung fields. Surgical clips are seen in gallbladder fossa. IMPRESSION: Tip of enteric tube is seen in the region of distal antrum of stomach or pylorus. Electronically Signed   By: Elmer Picker M.D.   On: 08/05/2021 12:09   US Abdomen Limited RUQ (LIVER/GB)  Result Date: 08/05/2021 CLINICAL DATA:  Abdominal pain, cholecystectomy EXAM: ULTRASOUND ABDOMEN LIMITED RIGHT UPPER QUADRANT COMPARISON:  02/16/2021 FINDINGS: Gallbladder: Cholecystectomy Common bile duct: Diameter: 6 mm Liver: Normal liver echotexture. Subtle nodularity of the liver capsule could reflect early cirrhosis. No focal parenchymal abnormality or intrahepatic duct dilation. Portal vein is patent on color Doppler imaging with normal direction of blood flow towards the liver. Other: None. IMPRESSION: 1. Subtle nodularity of the liver capsule, nonspecific. This could reflect early cirrhosis. Please correlate with liver function test. 2. Otherwise unremarkable exam in a patient status post cholecystectomy. Electronically Signed   By: Randa Ngo M.D.   On: 08/05/2021 16:58     LOS: 5 days   Oren Binet, MD  [facility]    To contact the attending provider between 7A-7P or the covering provider during after hours 7P-7A, please log into the web site [email or web address] and access using universal [place] password for that web site. If you do not have the password, please call the hospital operator.  08/07/2021, 11:58  AM

## 2021-08-07 NOTE — Progress Notes (Signed)
Inpatient Diabetes Program Recommendations ? ?AACE/ADA: New Consensus Statement on Inpatient Glycemic Control (2015) ? ?Target Ranges:  Prepandial:   less than 140 mg/dL ?     Peak postprandial:   less than 180 mg/dL (1-2 hours) ?     Critically ill patients:  140 - 180 mg/dL  ? ?Lab Results  ?Component Value Date  ? GLUCAP 247 (H) 08/07/2021  ? HGBA1C 6.0 (H) 05/27/2021  ? ? ?Review of Glycemic Control ? Latest Reference Range & Units 08/06/21 07:39 08/06/21 11:59 08/06/21 15:11 08/06/21 19:29 08/06/21 23:04 08/07/21 03:54 08/07/21 07:20 08/07/21 11:50 08/07/21 15:56  ?Glucose-Capillary 70 - 99 mg/dL 204 (H) 193 (H) 215 (H) 183 (H) 231 (H) 248 (H) 240 (H) 245 (H) 247 (H)  ? ?Diabetes history: DM 2 ?Outpatient Diabetes medications: Humalog 0-12 units tid ?Current orders for Inpatient glycemic control:  ?Novolog 0-9 units Q4 ? ?Solumedrol 50 mg Daily ?PO prednisone 5 mg Daily to start on 3/5 ?A1c 6% on 12/21 ? ?Inpatient Diabetes Program Recommendations:   ? ?-  may consider increasing Novolog to 0-15 units tid + hs scale ? ?Thanks, ? ?Tama Headings RN, MSN, BC-ADM ?Inpatient Diabetes Coordinator ?Team Pager [phone] (8a-5p) ? ? ? ?

## 2021-08-07 NOTE — Progress Notes (Signed)
Transferred - in from # m by bed awake and alert. ?

## 2021-08-08 ENCOUNTER — Other Ambulatory Visit (HOSPITAL_COMMUNITY): Payer: Medicare Other

## 2021-08-08 ENCOUNTER — Encounter (HOSPITAL_COMMUNITY): Payer: Self-pay | Admitting: Pulmonary Disease

## 2021-08-08 DIAGNOSIS — I5041 Acute combined systolic (congestive) and diastolic (congestive) heart failure: Secondary | ICD-10-CM

## 2021-08-08 DIAGNOSIS — Z940 Kidney transplant status: Secondary | ICD-10-CM | POA: Diagnosis not present

## 2021-08-08 DIAGNOSIS — N1831 Chronic kidney disease, stage 3a: Secondary | ICD-10-CM | POA: Diagnosis not present

## 2021-08-08 DIAGNOSIS — J9601 Acute respiratory failure with hypoxia: Secondary | ICD-10-CM | POA: Diagnosis not present

## 2021-08-08 DIAGNOSIS — E440 Moderate protein-calorie malnutrition: Secondary | ICD-10-CM | POA: Diagnosis not present

## 2021-08-08 DIAGNOSIS — R079 Chest pain, unspecified: Secondary | ICD-10-CM

## 2021-08-08 DIAGNOSIS — I5031 Acute diastolic (congestive) heart failure: Secondary | ICD-10-CM

## 2021-08-08 DIAGNOSIS — R000 Tachycardia, unspecified: Secondary | ICD-10-CM

## 2021-08-08 LAB — MAGNESIUM: Magnesium: 2 mg/dL (ref 1.7–2.4)

## 2021-08-08 LAB — GLUCOSE, CAPILLARY
Glucose-Capillary: 242 mg/dL — ABNORMAL HIGH (ref 70–99)
Glucose-Capillary: 243 mg/dL — ABNORMAL HIGH (ref 70–99)
Glucose-Capillary: 243 mg/dL — ABNORMAL HIGH (ref 70–99)
Glucose-Capillary: 253 mg/dL — ABNORMAL HIGH (ref 70–99)
Glucose-Capillary: 263 mg/dL — ABNORMAL HIGH (ref 70–99)
Glucose-Capillary: 274 mg/dL — ABNORMAL HIGH (ref 70–99)

## 2021-08-08 LAB — BASIC METABOLIC PANEL
Anion gap: 11 (ref 5–15)
BUN: 28 mg/dL — ABNORMAL HIGH (ref 6–20)
CO2: 22 mmol/L (ref 22–32)
Calcium: 8.2 mg/dL — ABNORMAL LOW (ref 8.9–10.3)
Chloride: 110 mmol/L (ref 98–111)
Creatinine, Ser: 1 mg/dL (ref 0.44–1.00)
GFR, Estimated: >60 mL/min (ref >60)
Glucose, Bld: 279 mg/dL — ABNORMAL HIGH (ref 70–99)
Potassium: 3.8 mmol/L (ref 3.5–5.1)
Sodium: 143 mmol/L (ref 135–145)

## 2021-08-08 LAB — CBC
HCT: 26.2 % — ABNORMAL LOW (ref 36.0–46.0)
Hemoglobin: 8.5 g/dL — ABNORMAL LOW (ref 12.0–15.0)
MCH: 28.5 pg (ref 26.0–34.0)
MCHC: 32.4 g/dL (ref 30.0–36.0)
MCV: 87.9 fL (ref 80.0–100.0)
Platelets: 217 10*3/uL (ref 150–400)
RBC: 2.98 MIL/uL — ABNORMAL LOW (ref 3.87–5.11)
RDW: 16.8 % — ABNORMAL HIGH (ref 11.5–15.5)
WBC: 12.3 10*3/uL — ABNORMAL HIGH (ref 4.0–10.5)
nRBC: 0.2 % (ref 0.0–0.2)

## 2021-08-08 LAB — T4, FREE: Free T4: 1.04 ng/dL (ref 0.61–1.12)

## 2021-08-08 LAB — PHOSPHORUS: Phosphorus: 2.4 mg/dL — ABNORMAL LOW (ref 2.5–4.6)

## 2021-08-08 LAB — PROTIME-INR
INR: 2.5 — ABNORMAL HIGH (ref 0.8–1.2)
Prothrombin Time: 27.3 seconds — ABNORMAL HIGH (ref 11.4–15.2)

## 2021-08-08 LAB — TSH: TSH: 8.378 u[IU]/mL — ABNORMAL HIGH (ref 0.350–4.500)

## 2021-08-08 MED ORDER — FUROSEMIDE 10 MG/ML IJ SOLN: 20.0000 mg | Freq: Two times a day (BID) | INTRAMUSCULAR | Status: DC

## 2021-08-08 MED ORDER — WARFARIN 0.5 MG HALF TABLET: 0.5000 mg | ORAL_TABLET | Freq: Once | ORAL | Status: DC

## 2021-08-08 MED ORDER — DILTIAZEM HCL-DEXTROSE 125-5 MG/125ML-% IV SOLN (PREMIX)
5.0000 mg/h | INTRAVENOUS | Status: DC
  Administered 2021-08-08: 15 mg/h via INTRAVENOUS
  Administered 2021-08-08: 5 mg/h via INTRAVENOUS
  Filled 2021-08-08: qty 125

## 2021-08-08 MED ORDER — KETOROLAC TROMETHAMINE 15 MG/ML IJ SOLN
7.5000 mg | Freq: Once | INTRAMUSCULAR | Status: AC
  Administered 2021-08-08: 7.5 mg via INTRAVENOUS
  Filled 2021-08-08: qty 1

## 2021-08-08 MED ORDER — LEVALBUTEROL HCL 0.63 MG/3ML IN NEBU: 0.6300 mg | INHALATION_SOLUTION | Freq: Four times a day (QID) | RESPIRATORY_TRACT | Status: DC

## 2021-08-08 MED ORDER — IPRATROPIUM BROMIDE 0.02 % IN SOLN
0.5000 mg | Freq: Four times a day (QID) | RESPIRATORY_TRACT | Status: DC
Start: 2021-08-08 — End: 2021-08-08

## 2021-08-08 MED ORDER — POTASSIUM PHOSPHATES 15 MMOLE/5ML IV SOLN
30.0000 mmol | Freq: Once | INTRAVENOUS | Status: AC
  Administered 2021-08-08: 30 mmol via INTRAVENOUS
  Filled 2021-08-08: qty 10

## 2021-08-08 MED ORDER — IPRATROPIUM BROMIDE 0.02 % IN SOLN
0.5000 mg | Freq: Three times a day (TID) | RESPIRATORY_TRACT | Status: DC
  Administered 2021-08-08 – 2021-08-13 (×15): 0.5 mg via RESPIRATORY_TRACT
  Filled 2021-08-08 (×15): qty 2.5

## 2021-08-08 MED ORDER — POTASSIUM CHLORIDE 10 MEQ/100ML IV SOLN
10.0000 meq | INTRAVENOUS | Status: DC
  Administered 2021-08-08: 10 meq via INTRAVENOUS
  Filled 2021-08-08: qty 100

## 2021-08-08 MED ORDER — WARFARIN 0.5 MG HALF TABLET
0.5000 mg | ORAL_TABLET | Freq: Once | ORAL | Status: AC
  Administered 2021-08-08: 0.5 mg via ORAL
  Filled 2021-08-08: qty 1

## 2021-08-08 MED ORDER — POTASSIUM CHLORIDE 10 MEQ/100ML IV SOLN
10.0000 meq | INTRAVENOUS | Status: AC
  Filled 2021-08-08: qty 100

## 2021-08-08 MED ORDER — FUROSEMIDE 10 MG/ML IJ SOLN
40.0000 mg | Freq: Two times a day (BID) | INTRAMUSCULAR | Status: DC
  Administered 2021-08-08 – 2021-08-12 (×10): 40 mg via INTRAVENOUS
  Filled 2021-08-08 (×10): qty 4

## 2021-08-08 MED ORDER — DILTIAZEM LOAD VIA INFUSION
10.0000 mg | Freq: Once | INTRAVENOUS | Status: AC
  Administered 2021-08-08: 10 mg via INTRAVENOUS
  Filled 2021-08-08: qty 10

## 2021-08-08 MED ORDER — LEVALBUTEROL HCL 0.63 MG/3ML IN NEBU
0.6300 mg | INHALATION_SOLUTION | Freq: Three times a day (TID) | RESPIRATORY_TRACT | Status: DC
  Administered 2021-08-08 – 2021-08-13 (×15): 0.63 mg via RESPIRATORY_TRACT
  Filled 2021-08-08 (×15): qty 3

## 2021-08-08 MED ORDER — BISOPROLOL FUMARATE 5 MG PO TABS
5.0000 mg | ORAL_TABLET | Freq: Every day | ORAL | Status: DC
  Administered 2021-08-08 – 2021-08-12 (×5): 5 mg
  Filled 2021-08-08 (×5): qty 1

## 2021-08-08 MED ORDER — FENTANYL CITRATE PF 50 MCG/ML IJ SOSY
50.0000 ug | PREFILLED_SYRINGE | Freq: Once | INTRAMUSCULAR | Status: AC | PRN
  Administered 2021-08-09: 50 ug via INTRAVENOUS
  Filled 2021-08-08: qty 1

## 2021-08-08 MED ORDER — GLYCOPYRROLATE 0.2 MG/ML IJ SOLN
0.1000 mg | Freq: Three times a day (TID) | INTRAMUSCULAR | Status: DC
  Administered 2021-08-08 – 2021-08-14 (×19): 0.1 mg via INTRAVENOUS
  Filled 2021-08-08 (×20): qty 1

## 2021-08-08 NOTE — Consult Note (Addendum)
Cardiology Consultation:   Patient ID: ZEVA LEBER MRN: [id number]; DOB: August 25, 1960  Admit date: 08/02/2021 Date of Consult: 08/08/2021  PCP:  [name], [name], MD    [facility] Providers Cardiologist:  Quay Burow, MD        Patient Profile:   Connie Ruiz is a 61 y.o. female with a hx of kidney transplant, antiphospholipid syndrome on Coumadin, multiple CVAs, who is being seen 08/08/2021 for the evaluation of possible aflutter at the request of Dr Sloan Leiter.  History of Present Illness:   Connie Ruiz is a 61 year old female with a history of kidney transplant, antiphospholipid syndrome on Coumadin, multiple CVAs, who we are consulted for possible atrial flutter with RVR.  She was admitted on 2/26 with acute respiratory distress and fever, required intubation.  Likely due to aspiration pneumonia.  Initially in septic shock, required Levophed.  She was stabilized and extubated on 2/28.  This morning she was noted to have elevated heart rate, possible atrial flutter.  Had been on carvedilol but this was discontinued due to wheezing.  She was started on Cardizem infusion.  INR was supratherapeutic on 3/1 at 6.1.  She was given vitamin K.  INR currently therapeutic at 2.5.  She reports feeling short of breath and feels like heart is racing.   Past Medical History:  Diagnosis Date   Anxiety    Candida esophagitis ([place]) 11/12/2014   CEREBROVASCULAR ACCIDENT, ACUTE 04/15/2010   CLOSTRIDIUM DIFFICILE COLITIS, HX OF 08/21/2007   CONGESTIVE HEART FAILURE 08/21/2007   Current use of long term anticoagulation    Dr. Andree Elk, [facility]   CVA 04/17/2010   Depression    Dr. Andree Elk, [place], TYPE II 08/21/2007   DVT, HX OF 08/21/2007   GERD 08/21/2007   GOUT 08/21/2007   History of stroke with residual effects    HYPERLIPIDEMIA 08/21/2007   HYPERTENSION 08/21/2007   Dr. Andree Elk, [place], HX OF 08/22/2007   s/p renal transplant-Dr. Bonney Leitz    LUPUS 08/21/2007   OSTEOPOROSIS 08/21/2007   Rheumatol at [facility]   Pulmonary embolism ([place]) 07/16/2010   Renal failure    RENAL INSUFFICIENCY 08/21/2007   Right sided weakness    Steroid-induced hyperglycemia 11/09/2014   Tachycardia    THYROID NODULE, LEFT 04/10/2009    Past Surgical History:  Procedure Laterality Date   BIOPSY  02/04/2021   Procedure: BIOPSY;  Surgeon: [name], MD;  Location: [facility];  Service: Endoscopy;;   CESAREAN SECTION     CHOLECYSTECTOMY     COLONOSCOPY  06/2000   [facility]    ENTEROSCOPY N/A 11/11/2014   Procedure: ENTEROSCOPY;  Surgeon: Ladene Artist, MD;  Location: [facility];  Service: Endoscopy;  Laterality: N/A;   FLEXIBLE SIGMOIDOSCOPY N/A 02/04/2021   Procedure: FLEXIBLE SIGMOIDOSCOPY;  Surgeon: [name], MD;  Location: [facility];  Service: Endoscopy;  Laterality: N/A;   KIDNEY TRANSPLANT Right 2009   RENAL BIOPSY, OPEN  1981   TUBAL LIGATION        Inpatient Medications: Scheduled Meds:  chlorhexidine  15 mL Mouth Rinse BID   Chlorhexidine Gluconate Cloth  6 each Topical Daily   enoxaparin (LOVENOX) injection  1 mg/kg Subcutaneous Q12H   feeding supplement (PROSource TF)  45 mL Per Tube BID   glycopyrrolate  0.1 mg Intravenous TID   hydrocortisone sod succinate (SOLU-CORTEF) inj  50 mg Intravenous Daily   Followed by   Derrill Memo ON 08/09/2021] predniSONE  5 mg  Per Tube Q breakfast   insulin aspart  0-9 Units Subcutaneous Q4H   levalbuterol  0.63 mg Nebulization TID   And   ipratropium  0.5 mg Nebulization TID   levothyroxine  150 mcg Per Tube Q0600   mouth rinse  15 mL Mouth Rinse q12n4p   pantoprazole sodium  40 mg Per Tube Daily   Phytonadione  100 mcg Oral QPM   sodium chloride flush  3 mL Intravenous Q12H   tacrolimus  1 mg Sublingual BID   Warfarin - Pharmacist Dosing Inpatient   Does not apply q1600   Continuous Infusions:  sodium chloride Stopped (08/06/21 1000)   diltiazem (CARDIZEM) infusion 15 mg/hr  (08/08/21 0919)   feeding supplement (OSMOLITE 1.5 CAL) 50 mL/hr at 08/07/21 1900   piperacillin-tazobactam (ZOSYN)  IV 3.375 g (08/08/21 0412)   potassium PHOSPHATE IVPB (in mmol) 30 mmol (08/08/21 0954)   PRN Meds: acetaminophen **OR** acetaminophen, albuterol, docusate, lidocaine (PF), polyethylene glycol, prochlorperazine  Allergies:    Allergies  Allergen Reactions   Oxycodone-Acetaminophen Shortness Of Breath and Nausea Only   Propoxyphene Nausea Only and Shortness Of Breath    States takes tylenol at home   Sulfonamide Derivatives Shortness Of Breath and Nausea Only   Codeine Nausea Only   Hydrocodone-Acetaminophen     unknown   Hydromorphone     On MAR   Other Other (See Comments)    No blood, Jehovaeh Witness    Sulfamethoxazole    Tape     Redness**PAPER TAPE OK**   Gabapentin Anxiety    twitching   Latex Rash   Metoprolol Rash   Morphine And Related Rash    IV site on arm is red, patient reports this is improving.  NO shortness of breath reported. Patient is given dose of Benadryl when taking Morphine    Rosiglitazone Rash    Social History:   Social History   Socioeconomic History   Marital status: Divorced    Spouse name: Not on file   Number of children: 1   Years of education: Not on file   Highest education level: Not on file  Occupational History   Occupation: DISABILITY    Employer: UNEMPLOYED  Tobacco Use   Smoking status: Never   Smokeless tobacco: Never  Vaping Use   Vaping Use: Never used  Substance and Sexual Activity   Alcohol use: No    Alcohol/week: 0.0 standard drinks   Drug use: No   Sexual activity: Not Currently    Partners: Male    Birth control/protection: Post-menopausal  Other Topics Concern   Not on file  Social History Narrative   Retired   Regular exercise-no   Pt completed hs.    Social Determinants of Health   Financial Resource Strain: Not on file  Food Insecurity: Not on file  Transportation Needs: Not on  file  Physical Activity: Not on file  Stress: Not on file  Social Connections: Not on file  Intimate Partner Violence: Not on file    Family History:    Family History  Problem Relation Age of Onset   Heart attack Mother    Heart disease Father    Asthma Sister    Asthma Daughter    Cancer Maternal Grandfather        prostate   Cancer Paternal Grandfather        colon     ROS:  Please see the history of present illness.   All other ROS reviewed and  negative.     Physical Exam/Data:   Vitals:   08/08/21 0347 08/08/21 0348 08/08/21 0737 08/08/21 0855  BP: (!) 124/95   130/82  Pulse: (!) 130   (!) 142  Resp: 18   20  Temp: 98.4 F (36.9 C)   98.9 F (37.2 C)  TempSrc: Oral   Oral  SpO2: 93%  98% 90%  Weight:  84.4 kg    Height:        Intake/Output Summary (Last 24 hours) at 08/08/2021 1033 Last data filed at 08/08/2021 0412 Gross per 24 hour  Intake 1787.29 ml  Output 501 ml  Net 1286.29 ml   Last 3 Weights 08/08/2021 08/03/2021 08/03/2021  Weight (lbs) 186 lb 1.1 oz 175 lb 7.8 oz 175 lb 7.8 oz  Weight (kg) 84.4 kg 79.6 kg 79.6 kg     Body mass index is 36.34 kg/m.  General: Chronically ill-appearing HEENT: normal Neck: +JVD Cardiac:  RRR; no murmur  Lungs: Expiratory wheezing Abd: soft, nontender, Ext: 2+ edema Musculoskeletal:  No deformities Skin: warm and dry  Neuro:  alert  EKG:  The EKG was personally reviewed and demonstrates: Sinus tachycardia, rate 129 Telemetry:  Telemetry was personally reviewed and demonstrates: Sinus tachycardia with PACs  Relevant CV Studies: Echocardiogram 02/03/2021: EF 65 to 94%, grade 1 diastolic dysfunction, normal RV function, mild MR.  Laboratory Data:  High Sensitivity Troponin:  No results for input(s): TROPONINIHS in the last 720 hours.   Chemistry Recent Labs  Lab 08/06/21 1428 08/07/21 0354 08/07/21 1620 08/08/21 0420  NA 141 142  --  143  K 3.7 2.8*  --  3.8  CL 110 112*  --  110  CO2 22 22  --  22   GLUCOSE 234* 283*  --  279*  BUN 29* 29*  --  28*  CREATININE 1.11* 1.07*  --  1.00  CALCIUM 8.4* 8.2*  --  8.2*  MG 2.2 2.1 2.0 2.0  GFRNONAA 57* 59*  --  >60  ANIONGAP 9 8  --  11    Recent Labs  Lab 08/02/21 0511 08/05/21 0341  PROT 5.1* 4.9*  ALBUMIN 2.2* 2.1*  AST 17 12*  ALT 10 11  ALKPHOS 48 34*  BILITOT 0.9 0.6   Lipids  Recent Labs  Lab 08/03/21 0135  TRIG 136    Hematology Recent Labs  Lab 08/06/21 0318 08/07/21 0354 08/08/21 0420  WBC 16.3* 10.9* 12.3*  RBC 2.80* 3.03* 2.98*  HGB 8.0* 8.8* 8.5*  HCT 24.5* 26.6* 26.2*  MCV 87.5 87.8 87.9  MCH 28.6 29.0 28.5  MCHC 32.7 33.1 32.4  RDW 16.5* 16.6* 16.8*  PLT 163 170 217   Thyroid No results for input(s): TSH, FREET4 in the last 168 hours.  BNPNo results for input(s): BNP, PROBNP in the last 168 hours.  DDimer No results for input(s): DDIMER in the last 168 hours.   Radiology/Studies:  CT HEAD WO CONTRAST (5MM)  Result Date: 08/05/2021 CLINICAL DATA:  Acute neurological deficit, suspected stroke EXAM: CT HEAD WITHOUT CONTRAST TECHNIQUE: Contiguous axial images were obtained from the base of the skull through the vertex without intravenous contrast. RADIATION DOSE REDUCTION: This exam was performed according to the departmental dose-optimization program which includes automated exposure control, adjustment of the mA and/or kV according to patient size and/or use of iterative reconstruction technique. COMPARISON:  08/02/2021 FINDINGS: Brain: Generalized atrophy. Ex vacuo dilatation of the ventricular system particularly frontal horn of LEFT lateral ventricle. No midline shift  or mass effect. Small vessel chronic ischemic changes of deep cerebral white matter. Old infarcts BILATERAL cerebellar hemispheres and LEFT MCA territory involving LEFT basal ganglia, LEFT frontal lobe and LEFT temporal lobe. Appearance is unchanged since the prior study. No intracranial hemorrhage, mass lesion, evidence of acute infarction,  or extra-axial fluid collection. Vascular: No hyperdense vessels. Atherosclerotic calcifications present in internal carotid and vertebral arteries bilaterally Skull: Intact Sinuses/Orbits: Scattered mucosal thickening and fluid throughout the paranasal sinuses and mastoid air cells Other: N/A IMPRESSION: Atrophy with small vessel chronic ischemic changes of deep cerebral white matter. Old infarcts BILATERAL cerebellar hemispheres and LEFT MCA territory. No acute intracranial abnormalities. Electronically Signed   By: Lavonia Dana M.D.   On: 08/05/2021 12:44   DG CHEST PORT 1 VIEW  Result Date: 08/05/2021 CLINICAL DATA:  Hypoxia. EXAM: PORTABLE CHEST 1 VIEW COMPARISON:  Chest x-ray dated August 03, 2021. FINDINGS: New right upper extremity PICC line with tip in the distal SVC. Stable cardiomediastinal silhouette. Similar right greater than left pleural effusions and bibasilar atelectasis/consolidation. No pneumothorax. No acute osseous abnormality. IMPRESSION: 1. New right upper extremity PICC line with tip in the distal SVC. 2. Similar right greater than left pleural effusions and bibasilar atelectasis/consolidation. Electronically Signed   By: Titus Dubin M.D.   On: 08/05/2021 10:42   DG Abd Portable 1V  Result Date: 08/05/2021 CLINICAL DATA:  Feeding tube placement EXAM: PORTABLE ABDOMEN - 1 VIEW COMPARISON:  02/03/2021 FINDINGS: Tip of enteric tube is seen in the distal antrum or pylorus of the stomach. Bowel gas pattern is nonspecific. Increased markings are seen in the lower lung fields. Surgical clips are seen in gallbladder fossa. IMPRESSION: Tip of enteric tube is seen in the region of distal antrum of stomach or pylorus. Electronically Signed   By: Elmer Picker M.D.   On: 08/05/2021 12:09   IR PICC PLACEMENT RIGHT >5 YRS INC IMG GUIDE  Result Date: 08/04/2021 INDICATION: Patient with limited IV access.  Request for PICC line placement EXAM: ULTRASOUND AND FLUOROSCOPIC GUIDED PICC LINE  INSERTION MEDICATIONS: Lidocaine 1% 2 mL CONTRAST:  None FLUOROSCOPY TIME:  3 minutes 18 seconds  (25 mGy) COMPLICATIONS: None immediate. TECHNIQUE: The procedure, risks, benefits, and alternatives were explained to the patient and informed written consent was obtained. The right upper extremity was prepped with chlorhexidine in a sterile fashion, and a sterile drape was applied covering the operative field. Maximum barrier sterile technique with sterile gowns and gloves were used for the procedure. A timeout was performed prior to the initiation of the procedure. Local anesthesia was provided with 1% lidocaine. After the overlying soft tissues were anesthetized and a micropuncture kit was utilized to access the right basilic vein. Real-time ultrasound guidance was utilized for vascular access including the acquisition of a permanent ultrasound image documenting patency of the accessed vessel. A guidewire was advanced to the level of the superior caval-atrial junction for measurement purposes and the PICC line was cut to length. A peel-away sheath was placed and a cm, 5 [place], dual lumen was inserted to level of the superior caval-atrial junction. A post procedure spot fluoroscopic was obtained. The catheter easily aspirated and flushed and was secured in place. A dressing was placed. The patient tolerated the procedure well without immediate post procedural complication. FINDINGS: After catheter placement, the tip lies within the superior cavoatrial junction. The catheter aspirates and flushes normally and is ready for immediate use. IMPRESSION: Successful ultrasound and fluoroscopic guided placement of a right basilic  vein approach, 30 cm, 5 [place], dual lumen PICC with tip at the superior caval-atrial junction. The PICC line is ready for immediate use. Read by: Rushie Nyhan, NP Electronically Signed   By: Miachel Roux M.D.   On: 08/04/2021 14:48   US Abdomen Limited RUQ (LIVER/GB)  Result Date:  08/05/2021 CLINICAL DATA:  Abdominal pain, cholecystectomy EXAM: ULTRASOUND ABDOMEN LIMITED RIGHT UPPER QUADRANT COMPARISON:  02/16/2021 FINDINGS: Gallbladder: Cholecystectomy Common bile duct: Diameter: 6 mm Liver: Normal liver echotexture. Subtle nodularity of the liver capsule could reflect early cirrhosis. No focal parenchymal abnormality or intrahepatic duct dilation. Portal vein is patent on color Doppler imaging with normal direction of blood flow towards the liver. Other: None. IMPRESSION: 1. Subtle nodularity of the liver capsule, nonspecific. This could reflect early cirrhosis. Please correlate with liver function test. 2. Otherwise unremarkable exam in a patient status post cholecystectomy. Electronically Signed   By: Randa Ngo M.D.   On: 08/05/2021 16:58     Assessment and Plan:   Tachycardia: Consult due to concern for atrial flutter.  EKG this morning appears sinus tachycardia, rate 129.  Similar to prior EKGs this admission.  There is a fair amount of artifact but appears to be P waves best seen in lead V1.  Will repeat twelve-lead EKG to confirm.  The patient has had persistent sinus tach this admission, likely due to acute illness with septic shock.  Does have hypothyroidism and is on levothyroxine, will check TSH, free T4  Acute diastolic heart failure: Likely due to aggressive IV fluid resuscitation on presentation for septic shock.  Now volume overloaded.  Net +7.7 L on admission.  Will start IV Lasix 40 mg twice daily.  Check echo  Acute hypoxic respiratory failure: Suspected due to aspiration pneumonia.  Currently on Zosyn.  Improved.  Septic shock: Due to pneumonia.  Resolved.  Previously required pressors, BP now stable.  AKI: Status post renal transplant.  Creatinine up to 1.4 on 2/28.  Has improved, 1.0 today.  History of PE/antiphospholipid syndrome: On Coumadin.  Required vitamin K due to supratherapeutic INR.  INR currently therapeutic at 2.5.  Coumadin per  pharmacy.   For questions or updates, please contact [place] Please consult [email or web address] for contact info under    Signed, Donato Heinz, MD  08/08/2021 10:33 AM

## 2021-08-08 NOTE — Progress Notes (Signed)
Patient developed acute-onset of chest pain. She has had chest pains earlier this admission that were associated with SOB but describes this as pressure and not associated with SOB.  ? ?Cardiology saw her today, started IV Lasix, and checking echo.   ? ?Plan to check EKG and troponins, and treat pain.  ?

## 2021-08-08 NOTE — Progress Notes (Signed)
ANTICOAGULATION CONSULT NOTE - Follow Up Consult ? ?Pharmacy Consult for Enoxaparin/Warfarin ?Indication: history of pulmonary embolus ? ?Allergies  ?Allergen Reactions  ? Oxycodone-Acetaminophen Shortness Of Breath and Nausea Only  ? Propoxyphene Nausea Only and Shortness Of Breath  ?  States takes tylenol at home  ? Sulfonamide Derivatives Shortness Of Breath and Nausea Only  ? Codeine Nausea Only  ? Hydrocodone-Acetaminophen   ?  unknown  ? Hydromorphone   ?  On MAR  ? Other Other (See Comments)  ?  No blood, Jehovaeh Witness   ? Sulfamethoxazole   ? Tape   ?  Redness**PAPER TAPE OK**  ? Gabapentin Anxiety  ?  twitching  ? Latex Rash  ? Metoprolol Rash  ? Morphine And Related Rash  ?  IV site on arm is red, patient reports this is improving.  NO shortness of breath reported. Patient is given dose of Benadryl when taking Morphine   ? Rosiglitazone Rash  ? ? ?Patient Measurements: ?Height: 5' (152.4 cm) ?Weight: 84.4 kg (186 lb 1.1 oz) ?IBW/kg (Calculated) : 45.5 ?Heparin Dosing Weight: 63.7 kg ? ?Vital Signs: ?Temp: 99.4 ?F (37.4 ?C) (03/04 1217) ?Temp Source: Oral (03/04 1217) ?BP: 126/75 (03/04 1217) ?Pulse Rate: 135 (03/04 1217) ? ?Labs: ?Recent Labs  ?  08/06/21 ?0318 08/06/21 ?1428 08/07/21 ?0354 08/08/21 ?0420  ?HGB 8.0*  --  8.8* 8.5*  ?HCT 24.5*  --  26.6* 26.2*  ?PLT 163  --  170 217  ?LABPROT 16.6* 16.3* 18.8* 27.3*  ?INR 1.3* 1.3* 1.6* 2.5*  ?CREATININE 1.24* 1.11* 1.07* 1.00  ? ? ? ?Estimated Creatinine Clearance: 57.7 mL/min (by C-G formula based on SCr of 1 mg/dL). ? ? ? ?Assessment: ?61 yo female from [facility] concern for aspiration pneumonia. On warfarin PTA with 3.'5mg'$  and '4mg'$  alternating. INR supratherapeutic at 5.3 on admission and vitamin K '1mg'$  IV given. Heparin gtt started when INR <2 and then held for supratherapeutic INR.  ? ?INR 6.1 on 3/1 and vitamin K '5mg'$  IV x1 given. INR again bumped to 1.6 without warfarin given. Low dose daily Vit K started ?Pharmacy consulted to start enoxaparin  treatment dosing while INR < 2 ?Hgb 8.0 Plt wnl. Some hematuria noted previously but resolved per RN. ?INR 2.5 today - stop enoxaparin  ?Will give low dose warfarin  ?. ? ?Goal of Therapy:  ?INR 2-3 ? ?Monitor platelets by anticoagulation protocol: Yes ?  ?Plan:  ?Stop enoxaparin ?Warfarin 0.'5mg'$  x1 today ?Vitamin K 100 mcg po qday ?Daily INR ? ? ?Bonnita Nasuti Pharm.D. CPP, BCPS ?Clinical Pharmacist ?[phone] ?08/08/2021 4:06 PM  ? ?Please see AMION for all Pharmacists' Contact Phone Numbers ?08/08/2021, 3:52 PM  ? ? ? ? ? ?

## 2021-08-08 NOTE — Plan of Care (Signed)
?  Problem: Clinical Measurements: ?Goal: Ability to maintain a body temperature in the normal range will improve ?Outcome: Progressing ?  ?Problem: Clinical Measurements: ?Goal: Will remain free from infection ?Outcome: Progressing ?  ?Problem: Nutrition: ?Goal: Adequate nutrition will be maintained ?Outcome: Progressing ?  ?Problem: Coping: ?Goal: Level of anxiety will decrease ?Outcome: Progressing ?  ?Problem: Activity: ?Goal: Ability to tolerate increased activity will improve ?Outcome: Not Progressing ?  ?Problem: Respiratory: ?Goal: Ability to maintain adequate ventilation will improve ?Outcome: Not Progressing ?Goal: Ability to maintain a clear airway will improve ?Outcome: Not Progressing ?  ?

## 2021-08-08 NOTE — Progress Notes (Signed)
Was notified by telemetry that pt has converted into atrial flutter. paged on-call MD, Dr. Myna Hidalgo. Will continue to monitor.  ?

## 2021-08-08 NOTE — Progress Notes (Addendum)
PROGRESS NOTE        PATIENT DETAILS Name: Connie Ruiz Age: 61 y.o. Sex: female Date of Birth: 08/08/1960 Admit Date: 08/02/2021 Admitting Physician Chesley Mires, MD NWG:[id number], Malka So, MD  Brief Summary: Patient is a 61 y.o.  female with history of kidney transplant, VTE/antiphospholipid syndrome on chronic Coumadin therapy, CVA with residual aphasia-slight right-sided deficits-resident of SNF who presented to the hospital with acute respiratory distress and fever, she was intubated in the emergency room-and managed in the ICU.  She was subsequently stabilized-extubated on 2/28-unfortunately-she required insertion of NG tube for feedings.  Her hypoxia gradually improved-and patient was transferred to [facility] service on 3/3.  See below for further details.  Significant Hospital events: 2/26>> admit to ICU-intubated. 2/27>> On vent, remains on Levophed 63mg, Vanc, Cefepime  2/28>> Extubated 3/03>> transfer to [facility]    Significant imaging studies: 2/26>> CXR: RLL infiltrate. 2/26>> CT head: No acute intracranial findings, chronic left MCA territory infarct. 3/01>> CT head: No acute intracranial abnormalities. 3/01>> RUQ ultrasound: Subtle nodularity of the liver-nonspecific-could reflect early cirrhosis.  Significant microbiology data: 2/26>> COVID/influenza PCR: Negative 2/26>> blood culture: Negative 2/27>> urine culture: Negative  Procedures: ETT>>2/26-2/28  Consults:  CCM, nephrology  Subjective: Still wheezing-appears comfortable-developed a flutter.  Objective: Vitals: Blood pressure 130/82, pulse (!) 142, temperature 98.9 F (37.2 C), temperature source Oral, resp. rate 20, height 5' (1.524 m), weight 84.4 kg, SpO2 90 %.   Exam: Gen Exam:Alert awake-not in any distress HEENT:atraumatic, normocephalic Chest: Some transmitted upper airway sounds-scattered wheezing continuous. CVS:S1S2 regular Abdomen:soft non tender, non  distended Extremities:+ edema Neurology: Non focal Skin: no rash   Pertinent Labs/Radiology: CBC Latest Ref Rng & Units 08/08/2021 08/07/2021 08/06/2021  WBC 4.0 - 10.5 K/uL 12.3(H) 10.9(H) 16.3(H)  Hemoglobin 12.0 - 15.0 g/dL 8.5(L) 8.8(L) 8.0(L)  Hematocrit 36.0 - 46.0 % 26.2(L) 26.6(L) 24.5(L)  Platelets 150 - 400 K/uL 217 170 163    Lab Results  Component Value Date   NA 143 08/08/2021   K 3.8 08/08/2021   CL 110 08/08/2021   CO2 22 08/08/2021      Assessment/Plan: Acute hypoxic respiratory failure likely due to aspiration pneumonia: Hypoxia has improved-remains on Zosyn-cultures negative so far.  Continues to have wheezing-we will stop Coreg to see if this improves-encourage use of incentive spirometry/flutter valve and pulmonary toileting.  We will start scheduled Robinul.  Septic shock (present on admission): Due to PNA-shock physiology has resolved-BP stable.  On Zosyn-cultures negative to date.  AKI-on CKD stage IIIa-history of renal transplant: AKI likely hemodynamically mediated-renal function has significantly improved on stress dose steroids, tacrolimus.  Nephrology has signed off.  Follow renal function periodically.  Has developed some mild volume overload-due to IV fluid hydration, hypoalbuminemia-we will attempt to diurese gently-starting IV Lasix.  HFpEF with exacerbation: Due to IVF hydration-hypoalbuminemia-starting Lasix and following volume status closely.  A flutter with RVR: Likely provoked by underlying PNA/infection-start Cardizem infusion-due to ongoing wheezing-we will stop Coreg.  Apparently patient has a history of having hives with metoprolol.  If no response to Cardizem infusion-is chronically anticoagulated and may be a good candidate for amiodarone.  Await echo.  If control is difficult-May require cardiology consult at some point.  Addendum: EKG/telemetry reviewed by cardiology-thought to have sinus tachycardia.  Discussed with Dr. SJonna Coup Cardizem infusion.  Supratherapeutic INR on admission: Given vitamin K-see  below.  Dysphagia: Likely chronic-worsened due to severe debility from acute illness-cortak tube in place-feedings ongoing.  Await SLP reevaluation to see if patient is appropriate for initiation of oral intake.  Hypokalemia/hypophosphatemia: Continue to replete and recheck.  Normocytic anemia: Likely due to critical illness-no evidence of blood loss-follow closely-patient is a [name].  DM-2 (A1c 6.0 on 05/27/2021) with steroid-induced hyperglycemia: Continue SSI-steroids being tapered down-CBGs should improve.  Recent Labs    08/08/21 0015 08/08/21 0349 08/08/21 0916  GLUCAP 243* 242* 253*      HTN: BP stable-was on Coreg-due to wheezing-we will stop-now on Cardizem infusion.  History of PE-antiphospholipid syndrome: INR slowly increasing-remains on Coumadin/Lovenox-pharmacy following.    History of multiple CVA's with chronic aphasia/right-sided weakness: At baseline-CT head x2 negative for acute abnormalities.  Resident of SNF.  Vascular dementia: We will resume Aricept, Prozac, mirtazapine and amitriptyline over the next few days.  Hypothyroidism: On levothyroxine.  Refusal of blood transfusions as patient is [name]  Debility/Deconditioning: Chronically frail and weak-Per daughter-patient has been very minimally ambulatory for the past 1 year and is mostly bed to wheelchair bound.  Her last CVA was 8 years ago.  Obtaining PT/OT has she appears to be much more weaker than her usual baseline.   Palliative care: Full code-difficult situation-frail/bedbound-now with significant debility/deconditioning-and severe dysphagia-continues to accumulate secretions.  Long discussion with daughter on 3/4-she is aware of tenuous clinical situation-we will go ahead and consult palliative care.  Nutrition Status: Nutrition Problem: Moderate Malnutrition Etiology: chronic illness (CHF,  CVA) Signs/Symptoms: mild fat depletion, moderate muscle depletion, percent weight loss (16% weight loss in 6 months) Percent weight loss: 16 % (6 months) Interventions: Tube feeding, Prostat   BMI: Estimated body mass index is 36.34 kg/m as calculated from the following:   Height as of this encounter: 5' (1.524 m).   Weight as of this encounter: 84.4 kg.   Code status:   Code Status: Full Code   DVT Prophylaxis: Place and maintain sequential compression device Start: 08/02/21 1242 SCDs Start: 08/02/21 1209     Family Communication: Daughter Connie Ruiz  [phone] 3/3   Disposition Plan: Status is: Inpatient Remains inpatient appropriate because: Resolving septic shock due to aspiration pneumonia-remains n.p.o. due to severe dysphagia.  On NG tube feedings.    Planned Discharge Destination:Skilled nursing facility   Diet: Diet Order             Diet NPO time specified  Diet effective now                     Antimicrobial agents: Anti-infectives (From admission, onward)    Start     Dose/Rate Route Frequency Ordered Stop   08/03/21 1500  vancomycin (VANCOREADY) IVPB 750 mg/150 mL  Status:  Discontinued        750 mg 150 mL/hr over 60 Minutes Intravenous Every 24 hours 08/02/21 1456 08/03/21 0912   08/03/21 0630  cefTRIAXone (ROCEPHIN) 2 g in sodium chloride 0.9 % 100 mL IVPB  Status:  Discontinued        2 g 200 mL/hr over 30 Minutes Intravenous Every 24 hours 08/02/21 1209 08/02/21 1246   08/02/21 2100  piperacillin-tazobactam (ZOSYN) IVPB 3.375 g       See Hyperspace for full Linked Orders Report.   3.375 g 12.5 mL/hr over 240 Minutes Intravenous Every 8 hours 08/02/21 1253 08/08/21 2359   08/02/21 1345  vancomycin (VANCOREADY) IVPB 1750 mg/350 mL  1,750 mg 175 mL/hr over 120 Minutes Intravenous  Once 08/02/21 1343 08/02/21 1721   08/02/21 1300  vancomycin (VANCOREADY) IVPB 2000 mg/400 mL  Status:  Discontinued        2,000 mg 200 mL/hr  over 120 Minutes Intravenous  Once 08/02/21 1253 08/02/21 1343   08/02/21 1300  piperacillin-tazobactam (ZOSYN) IVPB 3.375 g       See Hyperspace for full Linked Orders Report.   3.375 g 100 mL/hr over 30 Minutes Intravenous  Once 08/02/21 1253 08/02/21 1555   08/02/21 0630  cefTRIAXone (ROCEPHIN) 2 g in sodium chloride 0.9 % 100 mL IVPB        2 g 200 mL/hr over 30 Minutes Intravenous  Once 08/02/21 0618 08/02/21 0807   08/02/21 0630  azithromycin (ZITHROMAX) 500 mg in sodium chloride 0.9 % 250 mL IVPB  Status:  Discontinued        500 mg 250 mL/hr over 60 Minutes Intravenous Every 24 hours 08/02/21 0618 08/02/21 1246        MEDICATIONS: Scheduled Meds:  chlorhexidine  15 mL Mouth Rinse BID   Chlorhexidine Gluconate Cloth  6 each Topical Daily   enoxaparin (LOVENOX) injection  1 mg/kg Subcutaneous Q12H   feeding supplement (PROSource TF)  45 mL Per Tube BID   hydrocortisone sod succinate (SOLU-CORTEF) inj  50 mg Intravenous Daily   Followed by   Derrill Memo ON 08/09/2021] predniSONE  5 mg Per Tube Q breakfast   insulin aspart  0-9 Units Subcutaneous Q4H   levalbuterol  0.63 mg Nebulization TID   And   ipratropium  0.5 mg Nebulization TID   levothyroxine  150 mcg Per Tube Q0600   mouth rinse  15 mL Mouth Rinse q12n4p   pantoprazole sodium  40 mg Per Tube Daily   Phytonadione  100 mcg Oral QPM   sodium chloride flush  3 mL Intravenous Q12H   tacrolimus  1 mg Sublingual BID   Warfarin - Pharmacist Dosing Inpatient   Does not apply q1600   Continuous Infusions:  sodium chloride Stopped (08/06/21 1000)   diltiazem (CARDIZEM) infusion 15 mg/hr (08/08/21 0919)   feeding supplement (OSMOLITE 1.5 CAL) 50 mL/hr at 08/07/21 1900   piperacillin-tazobactam (ZOSYN)  IV 3.375 g (08/08/21 0412)   potassium PHOSPHATE IVPB (in mmol) 30 mmol (08/08/21 0954)   PRN Meds:.acetaminophen **OR** acetaminophen, albuterol, docusate, lidocaine (PF), polyethylene glycol, prochlorperazine   I have  personally reviewed following labs and imaging studies  LABORATORY DATA: CBC: Recent Labs  Lab 08/02/21 0511 08/02/21 1515 08/04/21 1425 08/05/21 0341 08/06/21 0318 08/07/21 0354 08/08/21 0420  WBC 10.4   < > 24.5* 23.0* 16.3* 10.9* 12.3*  NEUTROABS 6.6  --  22.6*  --   --   --   --   HGB 11.0*   < > 8.3* 7.6* 8.0* 8.8* 8.5*  HCT 37.1   < > 25.8* 24.0* 24.5* 26.6* 26.2*  MCV 97.4   < > 89.0 89.2 87.5 87.8 87.9  PLT 166   < > 188 183 163 170 217   < > = values in this interval not displayed.     Basic Metabolic Panel: Recent Labs  Lab 08/05/21 0341 08/05/21 1645 08/06/21 0318 08/06/21 1428 08/07/21 0354 08/07/21 1620 08/08/21 0420  NA 139  --  143 141 142  --  143  K 3.2*  --  2.8* 3.7 2.8*  --  3.8  CL 108  --  108 110 112*  --  110  CO2 20*  --  '23 22 22  '$ --  22  GLUCOSE 115*  --  179* 234* 283*  --  279*  BUN 30*  --  32* 29* 29*  --  28*  CREATININE 1.35*  --  1.24* 1.11* 1.07*  --  1.00  CALCIUM 8.0*  --  8.3* 8.4* 8.2*  --  8.2*  MG 2.2   < > 2.3 2.2 2.1 2.0 2.0  PHOS 3.3   < > 2.4* 2.7 1.9* 2.9 2.4*   < > = values in this interval not displayed.     GFR: Estimated Creatinine Clearance: 57.7 mL/min (by C-G formula based on SCr of 1 mg/dL).  Liver Function Tests: Recent Labs  Lab 08/02/21 0511 08/05/21 0341  AST 17 12*  ALT 10 11  ALKPHOS 48 34*  BILITOT 0.9 0.6  PROT 5.1* 4.9*  ALBUMIN 2.2* 2.1*    No results for input(s): LIPASE, AMYLASE in the last 168 hours. No results for input(s): AMMONIA in the last 168 hours.  Coagulation Profile: Recent Labs  Lab 08/05/21 0341 08/06/21 0318 08/06/21 1428 08/07/21 0354 08/08/21 0420  INR 6.1* 1.3* 1.3* 1.6* 2.5*     Cardiac Enzymes: No results for input(s): CKTOTAL, CKMB, CKMBINDEX, TROPONINI in the last 168 hours.  BNP (last 3 results) No results for input(s): PROBNP in the last 8760 hours.  Lipid Profile: No results for input(s): CHOL, HDL, LDLCALC, TRIG, CHOLHDL, LDLDIRECT in the last  72 hours.  Thyroid Function Tests: No results for input(s): TSH, T4TOTAL, FREET4, T3FREE, THYROIDAB in the last 72 hours.  Anemia Panel: No results for input(s): VITAMINB12, FOLATE, FERRITIN, TIBC, IRON, RETICCTPCT in the last 72 hours.  Urine analysis:    Component Value Date/Time   COLORURINE YELLOW 08/03/2021 0032   APPEARANCEUR CLOUDY (A) 08/03/2021 0032   LABSPEC 1.029 08/03/2021 0032   PHURINE 5.0 08/03/2021 0032   GLUCOSEU NEGATIVE 08/03/2021 0032   GLUCOSEU NEGATIVE 10/22/2013 1621   HGBUR NEGATIVE 08/03/2021 0032   BILIRUBINUR NEGATIVE 08/03/2021 0032   KETONESUR 5 (A) 08/03/2021 0032   PROTEINUR 30 (A) 08/03/2021 0032   UROBILINOGEN 1.0 03/27/2015 1534   NITRITE NEGATIVE 08/03/2021 0032   LEUKOCYTESUR TRACE (A) 08/03/2021 0032    Sepsis Labs: Lactic Acid, Venous    Component Value Date/Time   LATICACIDVEN 0.8 08/02/2021 0511    MICROBIOLOGY: Recent Results (from the past 240 hour(s))  Blood Culture (routine x 2)     Status: None   Collection Time: 08/02/21  6:04 AM   Specimen: BLOOD LEFT ARM  Result Value Ref Range Status   Specimen Description BLOOD LEFT ARM  Final   Special Requests   Final    BOTTLES DRAWN AEROBIC AND ANAEROBIC Blood Culture adequate volume   Culture   Final    NO GROWTH 5 DAYS Performed at [facility], [address], [place], [place] [address]    Report Status 08/07/2021 FINAL  Final  Blood Culture (routine x 2)     Status: None   Collection Time: 08/02/21  6:05 AM   Specimen: BLOOD LEFT HAND  Result Value Ref Range Status   Specimen Description BLOOD LEFT HAND  Final   Special Requests AEROBIC BOTTLE ONLY Blood Culture adequate volume  Final   Culture   Final    NO GROWTH 5 DAYS Performed at [facility], [place] [address], [place], [place] [address]    Report Status 08/07/2021 FINAL  Final  MRSA Next  Gen by PCR, Nasal     Status: None   Collection Time: 08/02/21 12:40 PM   Specimen: Nasal Mucosa; Nasal Swab   Result Value Ref Range Status   MRSA by PCR Next Gen NOT DETECTED NOT DETECTED Final    Comment: (NOTE) The GeneXpert MRSA Assay (FDA approved for NASAL specimens only), is one component of a comprehensive MRSA colonization surveillance program. It is not intended to diagnose MRSA infection nor to guide or monitor treatment for MRSA infections. Test performance is not FDA approved in patients less than 32 years old. Performed at [facility], [place] [address], [place], [place] [address]   Resp Panel by RT-PCR (Flu A&B, Covid) Nasopharyngeal Swab     Status: None   Collection Time: 08/02/21 12:51 PM   Specimen: Nasopharyngeal Swab; Nasopharyngeal(NP) swabs in vial transport medium  Result Value Ref Range Status   SARS Coronavirus 2 by RT PCR NEGATIVE NEGATIVE Final    Comment: (NOTE) SARS-CoV-2 target nucleic acids are NOT DETECTED.  The SARS-CoV-2 RNA is generally detectable in upper respiratory specimens during the acute phase of infection. The lowest concentration of SARS-CoV-2 viral copies this assay can detect is 138 copies/mL. A negative result does not preclude SARS-Cov-2 infection and should not be used as the sole basis for treatment or other patient management decisions. A negative result may occur with  improper specimen collection/handling, submission of specimen other than nasopharyngeal swab, presence of viral mutation(s) within the areas targeted by this assay, and inadequate number of viral copies(<138 copies/mL). A negative result must be combined with clinical observations, patient history, and epidemiological information. The expected result is Negative.  Fact Sheet for Patients:  EntrepreneurPulse.com.au  Fact Sheet for Healthcare Providers:  IncredibleEmployment.be  This test is no t yet approved or cleared by the [place] FDA and  has been authorized for detection and/or diagnosis of SARS-CoV-2 by FDA under an  Emergency Use Authorization (EUA). This EUA will remain  in effect (meaning this test can be used) for the duration of the COVID-19 declaration under Section 564(b)(1) of the Act, 21 U.S.C.section 360bbb-3(b)(1), unless the authorization is terminated  or revoked sooner.       Influenza A by PCR NEGATIVE NEGATIVE Final   Influenza B by PCR NEGATIVE NEGATIVE Final    Comment: (NOTE) The Xpert Xpress SARS-CoV-2/FLU/RSV plus assay is intended as an aid in the diagnosis of influenza from Nasopharyngeal swab specimens and should not be used as a sole basis for treatment. Nasal washings and aspirates are unacceptable for Xpert Xpress SARS-CoV-2/FLU/RSV testing.  Fact Sheet for Patients: EntrepreneurPulse.com.au  Fact Sheet for Healthcare Providers: IncredibleEmployment.be  This test is not yet approved or cleared by the [place] FDA and has been authorized for detection and/or diagnosis of SARS-CoV-2 by FDA under an Emergency Use Authorization (EUA). This EUA will remain in effect (meaning this test can be used) for the duration of the COVID-19 declaration under Section 564(b)(1) of the Act, 21 U.S.C. section 360bbb-3(b)(1), unless the authorization is terminated or revoked.  Performed at [facility], [place] [address] [place], [place] [address]   Urine Culture     Status: None   Collection Time: 08/03/21 12:21 AM   Specimen: In/Out Cath Urine  Result Value Ref Range Status   Specimen Description IN/OUT CATH URINE  Final   Special Requests NONE  Final   Culture   Final    NO GROWTH Performed at [facility], [place]  [address], [place], [place] [address]    Report Status 08/04/2021 FINAL  Final    RADIOLOGY STUDIES/RESULTS: No results found.   LOS: 6 days   Oren Binet, MD  [facility]    To contact the attending provider between 7A-7P or the covering provider during after hours 7P-7A, please log into the web  site [email or web address] and access using universal [place] password for that web site. If you do not have the password, please call the hospital operator.  08/08/2021, 10:14 AM

## 2021-08-09 ENCOUNTER — Inpatient Hospital Stay (HOSPITAL_COMMUNITY): Payer: Medicare Other

## 2021-08-09 DIAGNOSIS — I4891 Unspecified atrial fibrillation: Secondary | ICD-10-CM

## 2021-08-09 DIAGNOSIS — E440 Moderate protein-calorie malnutrition: Secondary | ICD-10-CM | POA: Diagnosis not present

## 2021-08-09 DIAGNOSIS — R1031 Right lower quadrant pain: Secondary | ICD-10-CM | POA: Diagnosis not present

## 2021-08-09 DIAGNOSIS — F0150 Vascular dementia without behavioral disturbance: Secondary | ICD-10-CM

## 2021-08-09 DIAGNOSIS — N1831 Chronic kidney disease, stage 3a: Secondary | ICD-10-CM | POA: Diagnosis not present

## 2021-08-09 DIAGNOSIS — Z4659 Encounter for fitting and adjustment of other gastrointestinal appliance and device: Secondary | ICD-10-CM

## 2021-08-09 DIAGNOSIS — Z515 Encounter for palliative care: Secondary | ICD-10-CM

## 2021-08-09 DIAGNOSIS — I5041 Acute combined systolic (congestive) and diastolic (congestive) heart failure: Secondary | ICD-10-CM | POA: Diagnosis not present

## 2021-08-09 DIAGNOSIS — Z531 Procedure and treatment not carried out because of patient's decision for reasons of belief and group pressure: Secondary | ICD-10-CM

## 2021-08-09 DIAGNOSIS — Z940 Kidney transplant status: Secondary | ICD-10-CM | POA: Diagnosis not present

## 2021-08-09 DIAGNOSIS — R000 Tachycardia, unspecified: Secondary | ICD-10-CM

## 2021-08-09 DIAGNOSIS — I248 Other forms of acute ischemic heart disease: Secondary | ICD-10-CM

## 2021-08-09 DIAGNOSIS — J9601 Acute respiratory failure with hypoxia: Secondary | ICD-10-CM | POA: Diagnosis not present

## 2021-08-09 LAB — ECHOCARDIOGRAM COMPLETE
AR max vel: 2.87 cm2
AV Area VTI: 2.69 cm2
AV Area mean vel: 2.76 cm2
AV Mean grad: 4 mmHg
AV Peak grad: 6.6 mmHg
Ao pk vel: 1.28 m/s
Calc EF: 43.2 %
Height: 60 in
S' Lateral: 2.5 cm
Single Plane A2C EF: 41.2 %
Single Plane A4C EF: 43 %
Weight: 2984.15 oz

## 2021-08-09 LAB — CBC
HCT: 16.6 % — ABNORMAL LOW (ref 36.0–46.0)
HCT: 25.3 % — ABNORMAL LOW (ref 36.0–46.0)
Hemoglobin: 5.3 g/dL — CL (ref 12.0–15.0)
Hemoglobin: 8.2 g/dL — ABNORMAL LOW (ref 12.0–15.0)
MCH: 29.1 pg (ref 26.0–34.0)
MCH: 28.7 pg (ref 26.0–34.0)
MCHC: 31.9 g/dL (ref 30.0–36.0)
MCHC: 32.4 g/dL (ref 30.0–36.0)
MCV: 91.2 fL (ref 80.0–100.0)
MCV: 88.5 fL (ref 80.0–100.0)
Platelets: 134 10*3/uL — ABNORMAL LOW (ref 150–400)
Platelets: 198 10*3/uL (ref 150–400)
RBC: 1.82 MIL/uL — ABNORMAL LOW (ref 3.87–5.11)
RBC: 2.86 MIL/uL — ABNORMAL LOW (ref 3.87–5.11)
RDW: 16.9 % — ABNORMAL HIGH (ref 11.5–15.5)
RDW: 16.9 % — ABNORMAL HIGH (ref 11.5–15.5)
WBC: 9.3 10*3/uL (ref 4.0–10.5)
WBC: 14.2 10*3/uL — ABNORMAL HIGH (ref 4.0–10.5)
nRBC: 0.4 % — ABNORMAL HIGH (ref 0.0–0.2)
nRBC: 0.4 % — ABNORMAL HIGH (ref 0.0–0.2)

## 2021-08-09 LAB — GLUCOSE, CAPILLARY
Glucose-Capillary: 173 mg/dL — ABNORMAL HIGH (ref 70–99)
Glucose-Capillary: 213 mg/dL — ABNORMAL HIGH (ref 70–99)
Glucose-Capillary: 227 mg/dL — ABNORMAL HIGH (ref 70–99)
Glucose-Capillary: 234 mg/dL — ABNORMAL HIGH (ref 70–99)
Glucose-Capillary: 235 mg/dL — ABNORMAL HIGH (ref 70–99)
Glucose-Capillary: 258 mg/dL — ABNORMAL HIGH (ref 70–99)
Glucose-Capillary: 311 mg/dL — ABNORMAL HIGH (ref 70–99)

## 2021-08-09 LAB — BASIC METABOLIC PANEL
Anion gap: 7 (ref 5–15)
BUN: 26 mg/dL — ABNORMAL HIGH (ref 6–20)
CO2: 25 mmol/L (ref 22–32)
Calcium: 8 mg/dL — ABNORMAL LOW (ref 8.9–10.3)
Chloride: 110 mmol/L (ref 98–111)
Creatinine, Ser: 0.98 mg/dL (ref 0.44–1.00)
GFR, Estimated: >60 mL/min (ref >60)
Glucose, Bld: 266 mg/dL — ABNORMAL HIGH (ref 70–99)
Potassium: 3.8 mmol/L (ref 3.5–5.1)
Sodium: 142 mmol/L (ref 135–145)

## 2021-08-09 LAB — PHOSPHORUS: Phosphorus: 4 mg/dL (ref 2.5–4.6)

## 2021-08-09 LAB — HEPATIC FUNCTION PANEL
ALT: 9 U/L (ref 0–44)
AST: 10 U/L — ABNORMAL LOW (ref 15–41)
Albumin: 1.8 g/dL — ABNORMAL LOW (ref 3.5–5.0)
Alkaline Phosphatase: 35 U/L — ABNORMAL LOW (ref 38–126)
Bilirubin, Direct: <0.1 mg/dL (ref 0.0–0.2)
Total Bilirubin: 0.4 mg/dL (ref 0.3–1.2)
Total Protein: 4.8 g/dL — ABNORMAL LOW (ref 6.5–8.1)

## 2021-08-09 LAB — PROTIME-INR
INR: 3.1 — ABNORMAL HIGH (ref 0.8–1.2)
INR: 2.6 — ABNORMAL HIGH (ref 0.8–1.2)
Prothrombin Time: 31.9 seconds — ABNORMAL HIGH (ref 11.4–15.2)
Prothrombin Time: 28 seconds — ABNORMAL HIGH (ref 11.4–15.2)

## 2021-08-09 LAB — TROPONIN I (HIGH SENSITIVITY)
Troponin I (High Sensitivity): 102 ng/L (ref <18)
Troponin I (High Sensitivity): 78 ng/L — ABNORMAL HIGH (ref <18)

## 2021-08-09 LAB — MAGNESIUM: Magnesium: 1.7 mg/dL (ref 1.7–2.4)

## 2021-08-09 LAB — TACROLIMUS LEVEL: Tacrolimus (FK506) - LabCorp: 5.5 ng/mL (ref 2.0–20.0)

## 2021-08-09 MED ORDER — FENTANYL CITRATE PF 50 MCG/ML IJ SOSY
50.0000 ug | PREFILLED_SYRINGE | INTRAMUSCULAR | Status: AC | PRN
  Administered 2021-08-09 – 2021-08-10 (×3): 50 ug via INTRAVENOUS
  Filled 2021-08-09 (×3): qty 1

## 2021-08-09 MED ORDER — FENTANYL CITRATE PF 50 MCG/ML IJ SOSY
50.0000 ug | PREFILLED_SYRINGE | Freq: Once | INTRAMUSCULAR | Status: AC | PRN
  Administered 2021-08-09: 50 ug via INTRAVENOUS
  Filled 2021-08-09: qty 1

## 2021-08-09 MED ORDER — WARFARIN 0.5 MG HALF TABLET
0.5000 mg | ORAL_TABLET | Freq: Once | ORAL | Status: AC
  Administered 2021-08-09: 0.5 mg via ORAL
  Filled 2021-08-09: qty 1

## 2021-08-09 NOTE — Progress Notes (Signed)
Was notified by lab that pt had critical hemoglobin level of 5.3 as of 08/09/21 at 0046. Notified Dr. Myna Hidalgo. I assessed the pt for signs of bleeding and redrew her labs per MD orders. No changes from prior assessment. Will continue to monitor. ? ?

## 2021-08-09 NOTE — Progress Notes (Addendum)
Progress Note  Patient Name: Connie Ruiz Date of Encounter: 08/09/2021  [facility] Cardiologist: Quay Burow, MD   Subjective   Reports chest pain with deep breathing.  Continues to feel short of breath.  BP 100/57 this morning  Inpatient Medications    Scheduled Meds:  bisoprolol  5 mg Per Tube Daily   chlorhexidine  15 mL Mouth Rinse BID   Chlorhexidine Gluconate Cloth  6 each Topical Daily   feeding supplement (PROSource TF)  45 mL Per Tube BID   furosemide  40 mg Intravenous BID   glycopyrrolate  0.1 mg Intravenous TID   insulin aspart  0-9 Units Subcutaneous Q4H   levalbuterol  0.63 mg Nebulization TID   And   ipratropium  0.5 mg Nebulization TID   levothyroxine  150 mcg Per Tube Q0600   mouth rinse  15 mL Mouth Rinse q12n4p   pantoprazole sodium  40 mg Per Tube Daily   Phytonadione  100 mcg Oral QPM   predniSONE  5 mg Per Tube Q breakfast   sodium chloride flush  3 mL Intravenous Q12H   tacrolimus  1 mg Sublingual BID   Warfarin - Pharmacist Dosing Inpatient   Does not apply q1600   Continuous Infusions:  sodium chloride Stopped (08/06/21 1000)   feeding supplement (OSMOLITE 1.5 CAL) 1,000 mL (08/09/21 0741)   PRN Meds: acetaminophen **OR** acetaminophen, albuterol, docusate, lidocaine (PF), polyethylene glycol, prochlorperazine   Vital Signs    Vitals:   08/09/21 0706 08/09/21 0728 08/09/21 0733 08/09/21 1148  BP: 108/73 (!) 100/57  (!) 119/101  Pulse: (!) 120 (!) 118  (!) 119  Resp: '14 18  13  '$ Temp: 98.7 F (37.1 C) 98.7 F (37.1 C)  98.7 F (37.1 C)  TempSrc: Oral Oral  Oral  SpO2: 96% 96% 95% 98%  Weight:      Height:        Intake/Output Summary (Last 24 hours) at 08/09/2021 1209 Last data filed at 08/09/2021 0500 Gross per 24 hour  Intake 1359.83 ml  Output 950 ml  Net 409.83 ml   Last 3 Weights 08/09/2021 08/08/2021 08/03/2021  Weight (lbs) 186 lb 8.2 oz 186 lb 1.1 oz 175 lb 7.8 oz  Weight (kg) 84.6 kg 84.4 kg 79.6 kg      Telemetry     Sinus tachycardia 110-120s- Personally Reviewed  ECG    Sinus tachycardia with PACs, rate 121  - Personally Reviewed  Physical Exam   GEN: No acute distress.   Neck: +JVD Cardiac: RRR, no murmurs, rubs, or gallops.  Respiratory: wheezing GI: Soft, nontender, non-distended  MS: BLE edema Neuro:  Nonfocal  Psych: Normal affect   Labs    High Sensitivity Troponin:   Recent Labs  Lab 08/09/21 0206  TROPONINIHS 102*     Chemistry Recent Labs  Lab 08/05/21 0341 08/05/21 1645 08/07/21 0354 08/07/21 1620 08/08/21 0420 08/09/21 0206  NA 139   < > 142  --  143 142  K 3.2*   < > 2.8*  --  3.8 3.8  CL 108   < > 112*  --  110 110  CO2 20*   < > 22  --  22 25  GLUCOSE 115*   < > 283*  --  279* 266*  BUN 30*   < > 29*  --  28* 26*  CREATININE 1.35*   < > 1.07*  --  1.00 0.98  CALCIUM 8.0*   < > 8.2*  --  8.2* 8.0*  MG 2.2   < > 2.1 2.0 2.0 1.7  PROT 4.9*  --   --   --   --  4.8*  ALBUMIN 2.1*  --   --   --   --  1.8*  AST 12*  --   --   --   --  10*  ALT 11  --   --   --   --  9  ALKPHOS 34*  --   --   --   --  35*  BILITOT 0.6  --   --   --   --  0.4  GFRNONAA 45*   < > 59*  --  >60 >60  ANIONGAP 11   < > 8  --  11 7   < > = values in this interval not displayed.    Lipids  Recent Labs  Lab 08/03/21 0135  TRIG 136    Hematology Recent Labs  Lab 08/08/21 0420 08/09/21 0046 08/09/21 0206  WBC 12.3* 9.3 14.2*  RBC 2.98* 1.82* 2.86*  HGB 8.5* 5.3* 8.2*  HCT 26.2* 16.6* 25.3*  MCV 87.9 91.2 88.5  MCH 28.5 29.1 28.7  MCHC 32.4 31.9 32.4  RDW 16.8* 16.9* 16.9*  PLT 217 134* 198   Thyroid  Recent Labs  Lab 08/08/21 1832  TSH 8.378*  FREET4 1.04    BNPNo results for input(s): BNP, PROBNP in the last 168 hours.  DDimer No results for input(s): DDIMER in the last 168 hours.   Radiology    DG Chest Port 1 View  Result Date: 08/09/2021 CLINICAL DATA:  Altered mental status. EXAM: PORTABLE CHEST 1 VIEW COMPARISON:  08/05/2021 FINDINGS: There is a  feeding tube with tip below the level of the hemidiaphragms. Right arm PICC line tip is at the cavoatrial junction. Stable cardiomediastinal contours. Bilateral pleural effusions, right greater than left, are again noted and not significantly changed from previous exam. IMPRESSION: 1. Stable bilateral pleural effusions, right greater than left. 2. Stable support apparatus. 3. No change in aeration to the lungs. Electronically Signed   By: Kerby Moors M.D.   On: 08/09/2021 08:41   ECHOCARDIOGRAM COMPLETE  Result Date: 08/09/2021    ECHOCARDIOGRAM REPORT   Patient Name:   Connie Ruiz Date of Exam: 08/09/2021 Medical Rec #:  [id number]    Height:       60.0 in Accession #:    [id number]   Weight:       186.5 lb Date of Birth:  1961-01-02    BSA:          1.812 m Patient Age:    61 years     BP:           100/57 mmHg Patient Gender: F            HR:           129 bpm. Exam Location:  Inpatient Procedure: 2D Echo, Cardiac Doppler and Color Doppler Indications:    Atrial fibrillation  History:        Patient has prior history of Echocardiogram examinations, most                 recent 02/03/2021. Signs/Symptoms:Shortness of Breath; Risk                 Factors:Hypertension, Diabetes and Dyslipidemia. Hx kidney                 transplant and CVA.  Sonographer:    Clayton Lefort RDCS (AE) Referring Phys: [place]  1. Possible takotsubo or stress induced cardiomyopathy.  2. Left ventricular ejection fraction, by estimation, is 35 to 40%. The left ventricle has moderately decreased function. The left ventricle has no regional wall motion abnormalities. There is severe left ventricular hypertrophy. Left ventricular diastolic parameters are indeterminate.  3. Right ventricular systolic function is normal. The right ventricular size is normal. There is mildly elevated pulmonary artery systolic pressure. The estimated right ventricular systolic pressure is 17.5 mmHg.  4. The mitral valve is normal in  structure. Mild mitral valve regurgitation. No evidence of mitral stenosis.  5. Tricuspid valve regurgitation is moderate.  6. The aortic valve is tricuspid. There is mild calcification of the aortic valve. Aortic valve regurgitation is mild. Aortic valve sclerosis is present, with no evidence of aortic valve stenosis.  7. The inferior vena cava is normal in size with greater than 50% respiratory variability, suggesting right atrial pressure of 3 mmHg. Comparison(s): Prior images reviewed side by side. The left ventricular function is worsened. FINDINGS  Left Ventricle: Left ventricular ejection fraction, by estimation, is 35 to 40%. The left ventricle has moderately decreased function. The left ventricle has no regional wall motion abnormalities. The left ventricular internal cavity size was normal in size. There is severe left ventricular hypertrophy. Left ventricular diastolic parameters are indeterminate.  LV Wall Scoring: The mid and distal anterior wall, entire apex, mid and distal inferior wall, mid anterolateral segment, and mid inferoseptal segment are akinetic. Right Ventricle: The right ventricular size is normal. No increase in right ventricular wall thickness. Right ventricular systolic function is normal. There is mildly elevated pulmonary artery systolic pressure. The tricuspid regurgitant velocity is 2.86  m/s, and with an assumed right atrial pressure of 8 mmHg, the estimated right ventricular systolic pressure is 10.2 mmHg. Left Atrium: Left atrial size was normal in size. Right Atrium: Right atrial size was normal in size. Pericardium: There is no evidence of pericardial effusion. Mitral Valve: The mitral valve is normal in structure. Mild mitral valve regurgitation. No evidence of mitral valve stenosis. Tricuspid Valve: The tricuspid valve is normal in structure. Tricuspid valve regurgitation is moderate . No evidence of tricuspid stenosis. Aortic Valve: The aortic valve is tricuspid. There is  mild calcification of the aortic valve. Aortic valve regurgitation is mild. Aortic valve sclerosis is present, with no evidence of aortic valve stenosis. Aortic valve mean gradient measures 4.0 mmHg. Aortic valve peak gradient measures 6.6 mmHg. Aortic valve area, by VTI measures 2.69 cm. Pulmonic Valve: The pulmonic valve was normal in structure. Pulmonic valve regurgitation is not visualized. No evidence of pulmonic stenosis. Aorta: The aortic root is normal in size and structure. Venous: The inferior vena cava is normal in size with greater than 50% respiratory variability, suggesting right atrial pressure of 3 mmHg. IAS/Shunts: No atrial level shunt detected by color flow Doppler. Additional Comments: Possible takotsubo or stress induced cardiomyopathy.  LEFT VENTRICLE PLAX 2D LVIDd:         3.40 cm LVIDs:         2.50 cm LV PW:         2.00 cm LV IVS:        1.80 cm LVOT diam:     2.10 cm LV SV:         48 LV SV Index:   26 LVOT Area:     3.46 cm  LV Volumes (MOD) LV  vol d, MOD A2C: 48.3 ml LV vol d, MOD A4C: 86.2 ml LV vol s, MOD A2C: 28.4 ml LV vol s, MOD A4C: 49.1 ml LV SV MOD A2C:     19.9 ml LV SV MOD A4C:     86.2 ml LV SV MOD BP:      29.0 ml RIGHT VENTRICLE             IVC RV Basal diam:  2.20 cm     IVC diam: 1.30 cm RV S prime:     15.60 cm/s TAPSE (M-mode): 1.5 cm LEFT ATRIUM           Index        RIGHT ATRIUM          Index LA diam:      3.80 cm 2.10 cm/m   RA Area:     7.32 cm LA Vol (A2C): 17.6 ml 9.71 ml/m   RA Volume:   12.30 ml 6.79 ml/m LA Vol (A4C): 29.6 ml 16.34 ml/m  AORTIC VALVE AV Area (Vmax):    2.87 cm AV Area (Vmean):   2.76 cm AV Area (VTI):     2.69 cm AV Vmax:           128.00 cm/s AV Vmean:          89.000 cm/s AV VTI:            0.178 m AV Peak Grad:      6.6 mmHg AV Mean Grad:      4.0 mmHg LVOT Vmax:         106.00 cm/s LVOT Vmean:        71.000 cm/s LVOT VTI:          0.138 m LVOT/AV VTI ratio: 0.78  AORTA Ao Root diam: 3.20 cm Ao Asc diam:  2.80 cm TRICUSPID VALVE  TR Peak grad:   32.7 mmHg TR Vmax:        286.00 cm/s  SHUNTS Systemic VTI:  0.14 m Systemic Diam: 2.10 cm Candee Furbish MD Electronically signed by Candee Furbish MD Signature Date/Time: 08/09/2021/11:45:06 AM    Final     Cardiac Studies   Echo 08/08/21:  1. Possible takotsubo or stress induced cardiomyopathy.   2. Left ventricular ejection fraction, by estimation, is 35 to 40%. The  left ventricle has moderately decreased function. The left ventricle has  no regional wall motion abnormalities. There is severe left ventricular  hypertrophy. Left ventricular  diastolic parameters are indeterminate.   3. Right ventricular systolic function is normal. The right ventricular  size is normal. There is mildly elevated pulmonary artery systolic  pressure. The estimated right ventricular systolic pressure is 16.0 mmHg.   4. The mitral valve is normal in structure. Mild mitral valve  regurgitation. No evidence of mitral stenosis.   5. Tricuspid valve regurgitation is moderate.   6. The aortic valve is tricuspid. There is mild calcification of the  aortic valve. Aortic valve regurgitation is mild. Aortic valve sclerosis  is present, with no evidence of aortic valve stenosis.   7. The inferior vena cava is normal in size with greater than 50%  respiratory variability, suggesting right atrial pressure of 3 mmHg.   Patient Profile     61 y.o. female with a hx of kidney transplant, antiphospholipid syndrome on Coumadin, multiple CVAs, who is being seen 08/08/2021 for the evaluation of possible aflutter  Assessment & Plan    Acute combined heart failure: presented with  fever/hypoxia, found to have septic shock due to pneumonia, received aggressive IV fluids. Now volume overloaded.  Low albumin (1.8) likely contributing. Echocardiogram today shows possible stress-induced cardiomyopathy, EF 35 to 40%, severe LVH, normal RV function, moderate TR.   -Continue IV lasix -Continue bisoprolol -Hold off on  ACE/ARB/Arni given soft BP -Plan reassessment of LV systolic function once recovers from acute illness.  If remains with reduced EF, will need ischemia evaluation.   Tachycardia: Consulted initially due to concern for atrial flutter.  EKG appears sinus tachycardia. The patient has had persistent sinus tach this admission, likely due to acute illness with septic shock.  Does have hypothyroidism and is on levothyroxine, thyroid studies show elevated TSH, normal free t4  Chest pain: she reported chest pain overnight, describes sharp pain with inspiration. Troponin 102.  Likely demand ischemia in setting of septic shock.  Will trend troponin.  Echo as above, plan ischemia evaluation if EF does not recover with recovery of acute illness  Acute hypoxic respiratory failure: Suspected due to aspiration pneumonia.  Currently on Zosyn.  Improved.   Septic shock: Due to pneumonia.  Resolved.  Previously required pressors, BP now stable.   AKI: Status post renal transplant.  Creatinine up to 1.4 on 2/28.  Has improved, 1.0 today.   History of PE/antiphospholipid syndrome: On Coumadin.  Required vitamin K due to supratherapeutic INR.  INR currently therapeutic at 2.6.  Coumadin per pharmacy.  For questions or updates, please contact [place] Please consult [email or web address] for contact info under        Signed, Donato Heinz, MD  08/09/2021, 12:09 PM

## 2021-08-09 NOTE — Consult Note (Addendum)
? ?                                                                                ?Consultation Note ?Date: 08/09/2021  ? ?Patient Name: Connie Ruiz  ?DOB: [birth year]  MRN: [id number]  Age / Sex: 61 y.o., female  ?PCP: [name], [name], MD ?Referring Physician: Jonetta Osgood, MD ? ?Reason for Consultation: Establishing goals of care ? ?HPI/Patient Profile: 61 y.o. female  with past medical history of antiphospholipid syndrome (Coumadin), multiple CVAs, hypthyroidism (Synthroid), vascular dementia (Aricept), kidney transplant, and lupus admitted on 08/02/2021 with acute respiratory distress, fever, and suspected aspiration PNA. Pt was intubated and successfully extubated during this hospitalization. NG tube for feeding remains.  ? ?Clinical Assessment and Goals of Care: ?I have reviewed medical records including EPIC notes, labs and imaging, assessed the patient and then met with patient and patient's brother Connie Ruiz to discuss diagnosis prognosis, GOC, EOL wishes, disposition and options. ? ?I introduced Palliative Medicine as specialized medical care for people living with serious illness. It focuses on providing relief from the symptoms and stress of a serious illness. The goal is to improve quality of life for both the patient and the family. ? ?Pt is able to state a few words but confirmed it was ok for me to speak freely in front her brother Connie Ruiz in regards to her current health status.  ? ?We discussed a brief life review of the patient. According to brother, pt did not work outside of the home and has spent more than half of her life in hospitals d/t her Lupus. Pt has one daughter, Connie Ruiz. Pt resides in a SNF.  ? ?As far as functional and nutritional status pt is bedbound and unable to complete ADLs independently. As per the brother, PTA she had a healthy appetite.  Family reports she was forgetful of date or time but overall recognized faces, remembers phone numbers, and can recall long term memories  and short term memories without difficulty.  ? ?We discussed patient's current illness and what it means in the larger context of patient's on-going co-morbidities. I attempted to elicit values and goals of care important to the patient. Pt shares she wants to live. Brother states she has shared she is "tired" of all of this. Pt nods her head in agreement but declines to elaborate.  ? ?Advance directives and concepts specific to code status were considered and discussed. When asked if she would want to allow a natural and peaceful passing patient states "yes". However, when asked if she would want CPR and a ventilator to sustain life she states, "I don't know". Given patient's documented dementia, I encouraged continued discussions with patient's daughter Connie Ruiz and I plan to speak with her this afternoon.  ?  ?Discussed with patient/family the importance of continued conversation with family and the medical providers regarding overall plan of care and treatment options, ensuring decisions are within the context of the patient?s values and GOCs.   ? ?Palliative Care services outpatient were explained. ? ?At brother's request, spiritual care consult for [place] witness clergy to visit with patient.  ? ?Questions and concerns were addressed. The family was encouraged to  call with questions or concerns.  ? ?Addendum: ? ?3903: I met Connie Ruiz and two of the patient's first cousins in the patient's room. Connie Ruiz and patient's confirmed it was ok to speak about patient's health status with first cousins present.  ? ?Connie Ruiz was aware that Palliative Medicine was part of her mother's care but was unclear on what that meant. Education offered on palliative services. ? ?Code status again discussed. Daughter says she would want her mom to have a ventilator. When asked for how long she would want her mother to live on a ventilator she said "I have never thought about that." When asked in presence of daughter and cousins if  patient would want to be placed on a ventilator patient said no. However, daughter said, "Mom, you would want CPR and for them to try and save you right?" To which patient nodded yes.  ? ?Continued conversations regarding ACP and code status needed. Further educations on outcomes of CPR in patient's with patient's current health status will be continued by PMT. ? ?During our discussion Dr. Gardiner Rhyme from cardiology rounded and assessed pt. After his visit, family had no further questions or concerns.  ? ?PMT will continue to follow the pt throughout there hospitalization.  ? ?Primary Decision Maker ?NEXT OF KIN ? ?Code Status/Advance Care Planning: ?Full code ? ?Prognosis:   ?Unable to determine ? ?Discharge Planning: To Be Determined ? ?Primary Diagnoses: ?Present on Admission: ? Sepsis due to pneumonia [facility]) ? Acute hypoxemic respiratory failure ([place]) ? Vascular dementia ([place]) ? Supratherapeutic INR ? Essential hypertension ? Chronic kidney disease, stage 3a ([place]) ? ? ?Physical Exam ?Vitals and nursing note reviewed.  ?Constitutional:   ?   General: She is not in acute distress. ?   Appearance: Normal appearance. She is not toxic-appearing.  ?HENT:  ?   Head: Normocephalic and atraumatic.  ?   Mouth/Throat:  ?   Mouth: Mucous membranes are moist.  ?Eyes:  ?   Pupils: Pupils are equal, round, and reactive to light.  ?Cardiovascular:  ?   Rate and Rhythm: Tachycardia present.  ?   Pulses: Normal pulses.  ?Pulmonary:  ?   Effort: Pulmonary effort is normal.  ?   Comments: Connie Ruiz in place ?Abdominal:  ?   Palpations: Abdomen is soft.  ?Musculoskeletal:  ?   Cervical back: Normal range of motion.  ?   Comments: Generalized weakness  ?Skin: ?   General: Skin is warm.  ?   Comments: Bilateral +2 LE edema  ?Neurological:  ?   Mental Status: She is alert. Mental status is at baseline.  ?Psychiatric:     ?   Behavior: Behavior normal.     ?   Thought Content: Thought content normal.     ?   Judgment: Judgment normal.   ? ? ?Palliative Assessment/Data: 40% ? ? ? ? ?I discussed this patient's plan of care with patient, patient's brother Connie Ruiz, patient's daughter Connie Ruiz, patient's two first cousins, Dr. Gardiner Rhyme. ? ?Thank you for this consult. Palliative medicine will continue to follow and assist holistically.  ? ?Time Total: 95 minutes ?Greater than 50%  of this time was spent counseling and coordinating care related to the above assessment and plan. ? ?Signed by: ?Jordan Hawks, DNP, FNP-BC ?Palliative Medicine ? ?  ?Please contact Palliative Medicine Team phone at [phone] for questions and concerns.  ?For individual provider: See Amion ? ? ? ? ? ? ? ? ? ? ? ? ?  ?

## 2021-08-09 NOTE — Progress Notes (Signed)
Hgb 5.3 this am, down from 8.5 yesterday.  ? ?No signs or symptoms of bleeding noted. Pt confirms that she would not accept blood products.  ? ?Suspect this Hgb may be spurious, plan to repeat.  ? ? ?

## 2021-08-09 NOTE — Plan of Care (Signed)
?  Problem: Clinical Measurements: ?Goal: Ability to maintain a body temperature in the normal range will improve ?Outcome: Progressing ?  ?Problem: Respiratory: ?Goal: Ability to maintain adequate ventilation will improve ?Outcome: Progressing ?  ?Problem: Clinical Measurements: ?Goal: Respiratory complications will improve ?Outcome: Progressing ?  ?Problem: Nutrition: ?Goal: Adequate nutrition will be maintained ?Outcome: Progressing ?  ?Problem: Activity: ?Goal: Ability to tolerate increased activity will improve ?Outcome: Not Progressing ?  ?

## 2021-08-09 NOTE — Progress Notes (Signed)
ANTICOAGULATION CONSULT NOTE - Follow Up Consult ? ?Pharmacy Consult for Enoxaparin/Warfarin ?Indication: history of pulmonary embolus ? ?Allergies  ?Allergen Reactions  ? Oxycodone-Acetaminophen Shortness Of Breath and Nausea Only  ? Propoxyphene Nausea Only and Shortness Of Breath  ?  States takes tylenol at home  ? Sulfonamide Derivatives Shortness Of Breath and Nausea Only  ? Codeine Nausea Only  ? Hydrocodone-Acetaminophen   ?  unknown  ? Hydromorphone   ?  On MAR  ? Other Other (See Comments)  ?  No blood, Jehov[employer]h Witness   ? Sulfamethoxazole   ? Tape   ?  Redness**PAPER TAPE OK**  ? Gabapentin Anxiety  ?  twitching  ? Latex Rash  ? Metoprolol Rash  ? Morphine And Related Rash  ?  IV site on arm is red, patient reports this is improving.  NO shortness of breath reported. Patient is given dose of Benadryl when taking Morphine   ? Rosiglitazone Rash  ? ? ?Patient Measurements: ?Height: 5' (152.4 cm) ?Weight: 84.6 kg (186 lb 8.2 oz) ?IBW/kg (Calculated) : 45.5 ?Heparin Dosing Weight: 63.7 kg ? ?Vital Signs: ?Temp: 98.7 ?F (37.1 ?C) (03/05 1148) ?Temp Source: Oral (03/05 1148) ?BP: 119/101 (03/05 1148) ?Pulse Rate: 119 (03/05 1148) ? ?Labs: ?Recent Labs  ?  08/07/21 ?0354 08/08/21 ?0420 08/09/21 ?6503 08/09/21 ?0206 08/09/21 ?1240  ?HGB 8.8* 8.5* 5.3* 8.2*  --   ?HCT 26.6* 26.2* 16.6* 25.3*  --   ?PLT 170 217 134* 198  --   ?LABPROT 18.8* 27.3* 31.9* 28.0*  --   ?INR 1.6* 2.5* 3.1* 2.6*  --   ?CREATININE 1.07* 1.00  --  0.98  --   ?TROPONINIHS  --   --   --  102* 78*  ? ? ? ?Estimated Creatinine Clearance: 58.9 mL/min (by C-G formula based on SCr of 0.98 mg/dL). ? ? ? ?Assessment: ?61 yo female from [facility] concern for aspiration pneumonia. On warfarin PTA with 3.'5mg'$  and '4mg'$  alternating. INR supratherapeutic at 5.3 on admission and vitamin K '1mg'$  IV given. Heparin gtt started when INR <2 and then held for supratherapeutic INR.  ? ?INR 6.1 on 3/1 and vitamin K '5mg'$  IV x1 given. INR again bumped to 1.6 without  warfarin given. Low dose daily Vit K started ?Pharmacy consulted to start enoxaparin treatment dosing while INR < 2 - stopped 3/4 ?Hgb 8.0 Plt wnl. Some hematuria noted previously but resolved per RN. ?INR 2.6 today   ?Will give low dose warfarin  ?. ? ?Goal of Therapy:  ?INR 2-3 ? ?Monitor platelets by anticoagulation protocol: Yes ?  ?Plan:  ?Warfarin 0.'5mg'$  x1 today repeat ?Vitamin K 100 mcg po qday ?Daily INR ? ? ?Bonnita Nasuti Pharm.D. CPP, BCPS ?Clinical Pharmacist ?[phone] ?08/09/2021 4:12 PM  ? ?Please see AMION for all Pharmacists' Contact Phone Numbers ?08/09/2021, 4:12 PM  ? ? ? ? ? ?

## 2021-08-09 NOTE — Progress Notes (Signed)
?  Echocardiogram ?2D Echocardiogram has been performed. ? ?Connie Ruiz ?08/09/2021, 10:59 AM ?

## 2021-08-09 NOTE — Progress Notes (Signed)
PROGRESS NOTE        PATIENT DETAILS Name: Connie Ruiz Age: 61 y.o. Sex: female Date of Birth: 1960-10-05 Admit Date: 08/02/2021 Admitting Physician Chesley Mires, MD KAJ:[id number], Malka So, MD  Brief Summary: Patient is a 61 y.o.  female with history of kidney transplant, VTE/antiphospholipid syndrome on chronic Coumadin therapy, CVA with residual aphasia-slight right-sided deficits-resident of SNF who presented to the hospital with acute respiratory distress and fever, she was intubated in the emergency room-and managed in the ICU.  She was subsequently stabilized-extubated on 2/28-unfortunately-she required insertion of NG tube for feedings.  Her hypoxia gradually improved-and patient was transferred to [facility] service on 3/3.  See below for further details.  Significant Hospital events: 2/26>> admit to ICU-intubated. 2/27>> On vent, remains on Levophed 40mg, Vanc, Cefepime  2/28>> Extubated 3/03[facility]    Significant imaging studies: 2/26>> CXR: RLL infiltrate. 2/26>> CT head: No acute intracranial findings, chronic left MCA territory infarct. 3/01>> CT head: No acute intracranial abnormalities. 3/01>> RUQ ultrasound: Subtle nodularity of the liver-nonspecific-could reflect early cirrhosis.  Significant microbiology data: 2/26>> COVID/influenza PCR: Negative 2/26>> blood culture: Negative 2/27>> urine culture: Negative  Procedures: ETT>>2/26-2/28  Consults:  CCM, nephrology  Subjective: Had some chest pain overnight but has since resolved.  Lying comfortably in bed.  On 1-2 L of oxygen for comfort.  Objective: Vitals: Blood pressure (!) 100/57, pulse (!) 118, temperature 98.7 F (37.1 C), temperature source Oral, resp. rate 18, height 5' (1.524 m), weight 84.6 kg, SpO2 95 %.   Exam: Gen Exam:Alert awake-not in any distress HEENT:atraumatic, normocephalic Chest: Some scattered wheezing-transmitted upper airway sounds. CVS:S1S2  regular Abdomen:soft non tender, non distended Extremities:++ edema Neurology: Non focal Skin: no rash   Pertinent Labs/Radiology: CBC Latest Ref Rng & Units 08/09/2021 08/09/2021 08/08/2021  WBC 4.0 - 10.5 K/uL 14.2(H) 9.3 12.3(H)  Hemoglobin 12.0 - 15.0 g/dL 8.2(L) 5.3(LL) 8.5(L)  Hematocrit 36.0 - 46.0 % 25.3(L) 16.6(L) 26.2(L)  Platelets 150 - 400 K/uL 198 134(L) 217    Lab Results  Component Value Date   NA 142 08/09/2021   K 3.8 08/09/2021   CL 110 08/09/2021   CO2 25 08/09/2021      Assessment/Plan: Acute hypoxic respiratory failure likely due to aspiration pneumonia: Hypoxia has improved-completed 7 days of Zosyn-cultures negative so far.  Continue to maintain aspiration precautions-encourage use of incentive spirometry/flutter valve/tolerating.  Discussed with nursing staff earlier this morning-keep head end elevated at 60 to 90 degree angle.  Remains on scheduled Robinul Septic shock (present on admission): Due to PNA-shock physiology has resolved-BP stable.  Has completed antimicrobial therapy.  Cultures negative so far.  AKI-on CKD stage IIIa-history of renal transplant: AKI likely hemodynamically mediated-renal function has significantly improved on stress dose steroids, tacrolimus.  Nephrology has signed off.  Follow renal function periodically as patient on IV Lasix for volume overload.    HFpEF with exacerbation: Due to IVF hydration-hypoalbuminemia-continues to have lower extremity edema-continue IV Lasix and follow renal function closely.   Sinus tachycardia: Worsening sinus tachycardia on 3/4-initially thought to have a flutter with RVR but upon cardiology review-felt to be sinus tachycardia.  Likely provoked by acute illness/PNA/physiological stress.  On beta-blocker.  Await echo.  TSH mildly elevated-but free T4 within normal limits   Atypical chest pain: Developed on 3/4 night-troponins minimally elevated.  Currently chest pain-free-suspect  may be GI related.  Watch  closely-await echo.  Supratherapeutic INR on admission: Given vitamin K-see below.  Dysphagia: Likely chronic-worsened due to severe debility from acute illness-cortak tube in place-feedings ongoing.  Await SLP reevaluation to see if patient is appropriate for initiation of oral intake.  Hypokalemia/hypophosphatemia: Continue to watch closely-repeat as needed.  Normocytic anemia: Likely due to critical illness-no evidence of blood loss-follow closely-patient is a [name].  DM-2 (A1c 6.0 on 05/27/2021) with steroid-induced hyperglycemia: Continue SSI-steroids being tapered down-CBGs should improve.  Recent Labs    08/09/21 0007 08/09/21 0328 08/09/21 0705  GLUCAP 234* 235* 227*     HTN: BP stable-continue bisoprolol.  Coreg discontinued due to bronchospasm.  History of PE-antiphospholipid syndrome: INR slowly increasing-remains on Coumadin/Lovenox-pharmacy following.    History of multiple CVA's with chronic aphasia/right-sided weakness: At baseline-CT head x2 negative for acute abnormalities.  Resident of SNF.  Vascular dementia: We will resume Aricept, Prozac, mirtazapine and amitriptyline over the next few days.  Hypothyroidism: On levothyroxine.  TSH mildly elevated-further optimization of levothyroxine dosing can be done by PCP in the outpatient setting, unclear to me when it was last adjusted.  Refusal of blood transfusions as patient is [name]  Debility/Deconditioning: Chronically frail and weak-Per daughter-patient has been very minimally ambulatory for the past 1 year and is mostly bed to wheelchair bound.  Her last CVA was 8 years ago.  Obtaining PT/OT has she appears to be much more weaker than her usual baseline.   Palliative care: Full code-difficult situation-frail/bedbound-now with significant debility/deconditioning-and severe dysphagia-continues to accumulate secretions.  Long discussion with daughter on 3/4-she is aware of tenuous clinical  situation-awaiting palliative care consultation.  Nutrition Status: Nutrition Problem: Moderate Malnutrition Etiology: chronic illness (CHF, CVA) Signs/Symptoms: mild fat depletion, moderate muscle depletion, percent weight loss (16% weight loss in 6 months) Percent weight loss: 16 % (6 months) Interventions: Tube feeding, Prostat   BMI: Estimated body mass index is 36.43 kg/m as calculated from the following:   Height as of this encounter: 5' (1.524 m).   Weight as of this encounter: 84.6 kg.   Code status:   Code Status: Full Code   DVT Prophylaxis: Place and maintain sequential compression device Start: 08/02/21 1242 SCDs Start: 08/02/21 1209     Family Communication: Daughter Billiejo Sorto  [phone] 3/5   Disposition Plan: Status is: Inpatient Remains inpatient appropriate because: Resolving septic shock due to aspiration pneumonia-remains n.p.o. due to severe dysphagia.  On NG tube feedings.    Planned Discharge Destination:Skilled nursing facility   Diet: Diet Order             Diet NPO time specified  Diet effective now                     Antimicrobial agents: Anti-infectives (From admission, onward)    Start     Dose/Rate Route Frequency Ordered Stop   08/03/21 1500  vancomycin (VANCOREADY) IVPB 750 mg/150 mL  Status:  Discontinued        750 mg 150 mL/hr over 60 Minutes Intravenous Every 24 hours 08/02/21 1456 08/03/21 0912   08/03/21 0630  cefTRIAXone (ROCEPHIN) 2 g in sodium chloride 0.9 % 100 mL IVPB  Status:  Discontinued        2 g 200 mL/hr over 30 Minutes Intravenous Every 24 hours 08/02/21 1209 08/02/21 1246   08/02/21 2100  piperacillin-tazobactam (ZOSYN) IVPB 3.375 g       See Hyperspace for full  Linked Orders Report.   3.375 g 12.5 mL/hr over 240 Minutes Intravenous Every 8 hours 08/02/21 1253 08/08/21 2359   08/02/21 1345  vancomycin (VANCOREADY) IVPB 1750 mg/350 mL        1,750 mg 175 mL/hr over 120 Minutes Intravenous   Once 08/02/21 1343 08/02/21 1721   08/02/21 1300  vancomycin (VANCOREADY) IVPB 2000 mg/400 mL  Status:  Discontinued        2,000 mg 200 mL/hr over 120 Minutes Intravenous  Once 08/02/21 1253 08/02/21 1343   08/02/21 1300  piperacillin-tazobactam (ZOSYN) IVPB 3.375 g       See Hyperspace for full Linked Orders Report.   3.375 g 100 mL/hr over 30 Minutes Intravenous  Once 08/02/21 1253 08/02/21 1555   08/02/21 0630  cefTRIAXone (ROCEPHIN) 2 g in sodium chloride 0.9 % 100 mL IVPB        2 g 200 mL/hr over 30 Minutes Intravenous  Once 08/02/21 0618 08/02/21 0807   08/02/21 0630  azithromycin (ZITHROMAX) 500 mg in sodium chloride 0.9 % 250 mL IVPB  Status:  Discontinued        500 mg 250 mL/hr over 60 Minutes Intravenous Every 24 hours 08/02/21 0618 08/02/21 1246        MEDICATIONS: Scheduled Meds:  bisoprolol  5 mg Per Tube Daily   chlorhexidine  15 mL Mouth Rinse BID   Chlorhexidine Gluconate Cloth  6 each Topical Daily   feeding supplement (PROSource TF)  45 mL Per Tube BID   furosemide  40 mg Intravenous BID   glycopyrrolate  0.1 mg Intravenous TID   insulin aspart  0-9 Units Subcutaneous Q4H   levalbuterol  0.63 mg Nebulization TID   And   ipratropium  0.5 mg Nebulization TID   levothyroxine  150 mcg Per Tube Q0600   mouth rinse  15 mL Mouth Rinse q12n4p   pantoprazole sodium  40 mg Per Tube Daily   Phytonadione  100 mcg Oral QPM   predniSONE  5 mg Per Tube Q breakfast   sodium chloride flush  3 mL Intravenous Q12H   tacrolimus  1 mg Sublingual BID   Warfarin - Pharmacist Dosing Inpatient   Does not apply q1600   Continuous Infusions:  sodium chloride Stopped (08/06/21 1000)   feeding supplement (OSMOLITE 1.5 CAL) 1,000 mL (08/09/21 0741)   PRN Meds:.acetaminophen **OR** acetaminophen, albuterol, docusate, lidocaine (PF), polyethylene glycol, prochlorperazine   I have personally reviewed following labs and imaging studies  LABORATORY DATA: CBC: Recent Labs  Lab  08/04/21 1425 08/05/21 0341 08/06/21 0318 08/07/21 0354 08/08/21 0420 08/09/21 0046 08/09/21 0206  WBC 24.5*   < > 16.3* 10.9* 12.3* 9.3 14.2*  NEUTROABS 22.6*  --   --   --   --   --   --   HGB 8.3*   < > 8.0* 8.8* 8.5* 5.3* 8.2*  HCT 25.8*   < > 24.5* 26.6* 26.2* 16.6* 25.3*  MCV 89.0   < > 87.5 87.8 87.9 91.2 88.5  PLT 188   < > 163 170 217 134* 198   < > = values in this interval not displayed.    Basic Metabolic Panel: Recent Labs  Lab 08/06/21 0318 08/06/21 1428 08/07/21 0354 08/07/21 1620 08/08/21 0420 08/09/21 0206  NA 143 141 142  --  143 142  K 2.8* 3.7 2.8*  --  3.8 3.8  CL 108 110 112*  --  110 110  CO2 '23 22 22  '$ --  22 25  GLUCOSE 179* 234* 283*  --  279* 266*  BUN 32* 29* 29*  --  28* 26*  CREATININE 1.24* 1.11* 1.07*  --  1.00 0.98  CALCIUM 8.3* 8.4* 8.2*  --  8.2* 8.0*  MG 2.3 2.2 2.1 2.0 2.0 1.7  PHOS 2.4* 2.7 1.9* 2.9 2.4* 4.0    GFR: Estimated Creatinine Clearance: 58.9 mL/min (by C-G formula based on SCr of 0.98 mg/dL).  Liver Function Tests: Recent Labs  Lab 08/05/21 0341 08/09/21 0206  AST 12* 10*  ALT 11 9  ALKPHOS 34* 35*  BILITOT 0.6 0.4  PROT 4.9* 4.8*  ALBUMIN 2.1* 1.8*   No results for input(s): LIPASE, AMYLASE in the last 168 hours. No results for input(s): AMMONIA in the last 168 hours.  Coagulation Profile: Recent Labs  Lab 08/06/21 1428 08/07/21 0354 08/08/21 0420 08/09/21 0046 08/09/21 0206  INR 1.3* 1.6* 2.5* 3.1* 2.6*    Cardiac Enzymes: No results for input(s): CKTOTAL, CKMB, CKMBINDEX, TROPONINI in the last 168 hours.  BNP (last 3 results) No results for input(s): PROBNP in the last 8760 hours.  Lipid Profile: No results for input(s): CHOL, HDL, LDLCALC, TRIG, CHOLHDL, LDLDIRECT in the last 72 hours.  Thyroid Function Tests: Recent Labs    08/08/21 1832  TSH 8.378*  FREET4 1.04    Anemia Panel: No results for input(s): VITAMINB12, FOLATE, FERRITIN, TIBC, IRON, RETICCTPCT in the last 72  hours.  Urine analysis:    Component Value Date/Time   COLORURINE YELLOW 08/03/2021 0032   APPEARANCEUR CLOUDY (A) 08/03/2021 0032   LABSPEC 1.029 08/03/2021 0032   PHURINE 5.0 08/03/2021 0032   GLUCOSEU NEGATIVE 08/03/2021 0032   GLUCOSEU NEGATIVE 10/22/2013 1621   HGBUR NEGATIVE 08/03/2021 0032   BILIRUBINUR NEGATIVE 08/03/2021 0032   KETONESUR 5 (A) 08/03/2021 0032   PROTEINUR 30 (A) 08/03/2021 0032   UROBILINOGEN 1.0 03/27/2015 1534   NITRITE NEGATIVE 08/03/2021 0032   LEUKOCYTESUR TRACE (A) 08/03/2021 0032    Sepsis Labs: Lactic Acid, Venous    Component Value Date/Time   LATICACIDVEN 0.8 08/02/2021 0511    MICROBIOLOGY: Recent Results (from the past 240 hour(s))  Blood Culture (routine x 2)     Status: None   Collection Time: 08/02/21  6:04 AM   Specimen: BLOOD LEFT ARM  Result Value Ref Range Status   Specimen Description BLOOD LEFT ARM  Final   Special Requests   Final    BOTTLES DRAWN AEROBIC AND ANAEROBIC Blood Culture adequate volume   Culture   Final    NO GROWTH 5 DAYS Performed at [facility], [address], [place], [place] [address]    Report Status 08/07/2021 FINAL  Final  Blood Culture (routine x 2)     Status: None   Collection Time: 08/02/21  6:05 AM   Specimen: BLOOD LEFT HAND  Result Value Ref Range Status   Specimen Description BLOOD LEFT HAND  Final   Special Requests AEROBIC BOTTLE ONLY Blood Culture adequate volume  Final   Culture   Final    NO GROWTH 5 DAYS Performed at [facility], [place] [address] [place],  [address]    Report Status 08/07/2021 FINAL  Final  MRSA Next Gen by PCR, Nasal     Status: None   Collection Time: 08/02/21 12:40 PM   Specimen: Nasal Mucosa; Nasal Swab  Result Value Ref Range Status   MRSA by PCR Next Gen NOT DETECTED NOT DETECTED Final  Comment: (NOTE) The GeneXpert MRSA Assay (FDA approved for NASAL specimens only), is one component of a comprehensive MRSA colonization  surveillance program. It is not intended to diagnose MRSA infection nor to guide or monitor treatment for MRSA infections. Test performance is not FDA approved in patients less than 63 years old. Performed at [facility], [place] [address] [place], [place] [address]   Resp Panel by RT-PCR (Flu A&B, Covid) Nasopharyngeal Swab     Status: None   Collection Time: 08/02/21 12:51 PM   Specimen: Nasopharyngeal Swab; Nasopharyngeal(NP) swabs in vial transport medium  Result Value Ref Range Status   SARS Coronavirus 2 by RT PCR NEGATIVE NEGATIVE Final    Comment: (NOTE) SARS-CoV-2 target nucleic acids are NOT DETECTED.  The SARS-CoV-2 RNA is generally detectable in upper respiratory specimens during the acute phase of infection. The lowest concentration of SARS-CoV-2 viral copies this assay can detect is 138 copies/mL. A negative result does not preclude SARS-Cov-2 infection and should not be used as the sole basis for treatment or other patient management decisions. A negative result may occur with  improper specimen collection/handling, submission of specimen other than nasopharyngeal swab, presence of viral mutation(s) within the areas targeted by this assay, and inadequate number of viral copies(<138 copies/mL). A negative result must be combined with clinical observations, patient history, and epidemiological information. The expected result is Negative.  Fact Sheet for Patients:  EntrepreneurPulse.com.au  Fact Sheet for Healthcare Providers:  IncredibleEmployment.be  This test is no t yet approved or cleared by the [place] FDA and  has been authorized for detection and/or diagnosis of SARS-CoV-2 by FDA under an Emergency Use Authorization (EUA). This EUA will remain  in effect (meaning this test can be used) for the duration of the COVID-19 declaration under Section 564(b)(1) of the Act, 21 U.S.C.section 360bbb-3(b)(1), unless the  authorization is terminated  or revoked sooner.       Influenza A by PCR NEGATIVE NEGATIVE Final   Influenza B by PCR NEGATIVE NEGATIVE Final    Comment: (NOTE) The Xpert Xpress SARS-CoV-2/FLU/RSV plus assay is intended as an aid in the diagnosis of influenza from Nasopharyngeal swab specimens and should not be used as a sole basis for treatment. Nasal washings and aspirates are unacceptable for Xpert Xpress SARS-CoV-2/FLU/RSV testing.  Fact Sheet for Patients: EntrepreneurPulse.com.au  Fact Sheet for Healthcare Providers: IncredibleEmployment.be  This test is not yet approved or cleared by the [place] FDA and has been authorized for detection and/or diagnosis of SARS-CoV-2 by FDA under an Emergency Use Authorization (EUA). This EUA will remain in effect (meaning this test can be used) for the duration of the COVID-19 declaration under Section 564(b)(1) of the Act, 21 U.S.C. section 360bbb-3(b)(1), unless the authorization is terminated or revoked.  Performed at [facility], [place] [address], [place], [place] [address]   Urine Culture     Status: None   Collection Time: 08/03/21 12:21 AM   Specimen: In/Out Cath Urine  Result Value Ref Range Status   Specimen Description IN/OUT CATH URINE  Final   Special Requests NONE  Final   Culture   Final    NO GROWTH Performed at [facility], [place] [address], [place],  [address]    Report Status 08/04/2021 FINAL  Final    RADIOLOGY STUDIES/RESULTS: DG Chest Port 1 View  Result Date: 08/09/2021 CLINICAL DATA:  Altered mental status. EXAM: PORTABLE CHEST 1 VIEW COMPARISON:  08/05/2021 FINDINGS: There is  a feeding tube with tip below the level of the hemidiaphragms. Right arm PICC line tip is at the cavoatrial junction. Stable cardiomediastinal contours. Bilateral pleural effusions, right greater than left, are again noted and not significantly changed from previous exam.  IMPRESSION: 1. Stable bilateral pleural effusions, right greater than left. 2. Stable support apparatus. 3. No change in aeration to the lungs. Electronically Signed   By: Kerby Moors M.D.   On: 08/09/2021 08:41     LOS: 7 days   Oren Binet, MD  [facility]    To contact the attending provider between 7A-7P or the covering provider during after hours 7P-7A, please log into the web site [email or web address] and access using universal [place] password for that web site. If you do not have the password, please call the hospital operator.  08/09/2021, 11:39 AM

## 2021-08-09 NOTE — Plan of Care (Signed)
?  Problem: Health Behavior/Discharge Planning: Goal: Ability to manage health-related needs will improve Outcome: Not Progressing   Problem: Clinical Measurements: Goal: Ability to maintain clinical measurements within normal limits will improve Outcome: Not Progressing Goal: Will remain free from infection Outcome: Not Progressing Goal: Diagnostic test results will improve Outcome: Not Progressing Goal: Respiratory complications will improve Outcome: Not Progressing Goal: Cardiovascular complication will be avoided Outcome: Not Progressing   

## 2021-08-09 NOTE — Progress Notes (Signed)
No Resp. Distress noted. No bipap needed at this time. RT will monitor.  ?

## 2021-08-09 NOTE — Progress Notes (Signed)
Pt complained of 10/10 chest pain. Pt described the pain as pressure and not associated with her breathing. Paged Dr. Myna Hidalgo. Orders for fentanyl IV and potassium X2 IV was given. Labs for troponin levels were also ordered. Will continue to monitor. ?

## 2021-08-10 DIAGNOSIS — I248 Other forms of acute ischemic heart disease: Secondary | ICD-10-CM

## 2021-08-10 DIAGNOSIS — J9601 Acute respiratory failure with hypoxia: Secondary | ICD-10-CM | POA: Diagnosis not present

## 2021-08-10 DIAGNOSIS — I5041 Acute combined systolic (congestive) and diastolic (congestive) heart failure: Secondary | ICD-10-CM | POA: Diagnosis not present

## 2021-08-10 DIAGNOSIS — I10 Essential (primary) hypertension: Secondary | ICD-10-CM | POA: Diagnosis not present

## 2021-08-10 DIAGNOSIS — Z789 Other specified health status: Secondary | ICD-10-CM

## 2021-08-10 DIAGNOSIS — I6930 Unspecified sequelae of cerebral infarction: Secondary | ICD-10-CM

## 2021-08-10 DIAGNOSIS — N1831 Chronic kidney disease, stage 3a: Secondary | ICD-10-CM | POA: Diagnosis not present

## 2021-08-10 LAB — TROPONIN I (HIGH SENSITIVITY)
Troponin I (High Sensitivity): 56 ng/L — ABNORMAL HIGH (ref <18)
Troponin I (High Sensitivity): 47 ng/L — ABNORMAL HIGH (ref <18)

## 2021-08-10 LAB — GLUCOSE, CAPILLARY
Glucose-Capillary: 202 mg/dL — ABNORMAL HIGH (ref 70–99)
Glucose-Capillary: 159 mg/dL — ABNORMAL HIGH (ref 70–99)
Glucose-Capillary: 242 mg/dL — ABNORMAL HIGH (ref 70–99)
Glucose-Capillary: 329 mg/dL — ABNORMAL HIGH (ref 70–99)
Glucose-Capillary: 305 mg/dL — ABNORMAL HIGH (ref 70–99)

## 2021-08-10 LAB — BASIC METABOLIC PANEL
Anion gap: 6 (ref 5–15)
BUN: 27 mg/dL — ABNORMAL HIGH (ref 6–20)
CO2: 28 mmol/L (ref 22–32)
Calcium: 8.1 mg/dL — ABNORMAL LOW (ref 8.9–10.3)
Chloride: 107 mmol/L (ref 98–111)
Creatinine, Ser: 0.91 mg/dL (ref 0.44–1.00)
GFR, Estimated: >60 mL/min (ref >60)
Glucose, Bld: 219 mg/dL — ABNORMAL HIGH (ref 70–99)
Potassium: 4 mmol/L (ref 3.5–5.1)
Sodium: 141 mmol/L (ref 135–145)

## 2021-08-10 LAB — LIPID PANEL
Cholesterol: 143 mg/dL (ref 0–200)
HDL: 37 mg/dL — ABNORMAL LOW (ref >40)
LDL Cholesterol: 72 mg/dL (ref 0–99)
Total CHOL/HDL Ratio: 3.9 RATIO
Triglycerides: 169 mg/dL — ABNORMAL HIGH (ref <150)
VLDL: 34 mg/dL (ref 0–40)

## 2021-08-10 LAB — PROTIME-INR
INR: 2.1 — ABNORMAL HIGH (ref 0.8–1.2)
Prothrombin Time: 23.8 seconds — ABNORMAL HIGH (ref 11.4–15.2)

## 2021-08-10 LAB — MAGNESIUM: Magnesium: 1.6 mg/dL — ABNORMAL LOW (ref 1.7–2.4)

## 2021-08-10 LAB — PHOSPHORUS: Phosphorus: 4.1 mg/dL (ref 2.5–4.6)

## 2021-08-10 MED ORDER — NITROGLYCERIN 0.4 MG SL SUBL
0.4000 mg | SUBLINGUAL_TABLET | SUBLINGUAL | Status: DC | PRN
  Administered 2021-08-10 – 2021-08-14 (×4): 0.4 mg via SUBLINGUAL
  Filled 2021-08-10 (×5): qty 1

## 2021-08-10 MED ORDER — FENTANYL CITRATE PF 50 MCG/ML IJ SOSY
25.0000 ug | PREFILLED_SYRINGE | INTRAMUSCULAR | Status: DC | PRN
  Administered 2021-08-10 – 2021-08-11 (×6): 25 ug via INTRAVENOUS
  Filled 2021-08-10 (×6): qty 1

## 2021-08-10 MED ORDER — TACROLIMUS 1 MG PO CAPS
1.0000 mg | ORAL_CAPSULE | Freq: Once | ORAL | Status: AC
  Administered 2021-08-10: 1 mg via SUBLINGUAL
  Filled 2021-08-10: qty 1

## 2021-08-10 MED ORDER — WARFARIN SODIUM 1 MG PO TABS
1.5000 mg | ORAL_TABLET | Freq: Once | ORAL | Status: AC
  Administered 2021-08-10: 1.5 mg
  Filled 2021-08-10 (×2): qty 1

## 2021-08-10 MED ORDER — PHYTONADIONE 100 MCG PO TABS
100.0000 ug | ORAL_TABLET | Freq: Every evening | ORAL | Status: DC
  Administered 2021-08-10: 100 ug
  Filled 2021-08-10 (×3): qty 1

## 2021-08-10 MED ORDER — PANTOPRAZOLE 2 MG/ML SUSPENSION
40.0000 mg | Freq: Two times a day (BID) | ORAL | Status: DC
  Administered 2021-08-10 – 2021-08-11 (×3): 40 mg
  Filled 2021-08-10 (×3): qty 20

## 2021-08-10 MED ORDER — MAGNESIUM SULFATE 4 GM/100ML IV SOLN
4.0000 g | Freq: Once | INTRAVENOUS | Status: AC
  Administered 2021-08-10: 4 g via INTRAVENOUS
  Filled 2021-08-10: qty 100

## 2021-08-10 NOTE — Progress Notes (Addendum)
?                                                   ?Palliative Care Progress Note, Assessment & Plan  ? ?Patient Name: Connie Ruiz       Date: 08/10/2021 ?DOB: [birth year]  Age: 61 y.o. MRN#: [id number] ?Attending Physician: Connie Ruiz ?Primary Care Physician: Connie Ruiz ?Admit Date: 08/02/2021 ? ?Reason for Consultation/Follow-up: Establishing goals of care ? ?Subjective: ?Patient is sitting in bed.  She appears miserable.  She acknowledges my presence and is able to make her wishes known.  No family at bedside.  NAD.  Cortrak in place. ? ?HPI: ?61 y.o. female  with past medical history of antiphospholipid syndrome (Coumadin), multiple CVAs, hypthyroidism (Synthroid), vascular dementia (Aricept), kidney transplant, and lupus admitted on 08/02/2021 with acute respiratory distress, fever, and suspected aspiration PNA. Pt was intubated and successfully extubated during this hospitalization. NG tube for feeding remains.  ? ?Summary of counseling/coordination of care: ?After reviewing the patient's chart and assessing the patient at bedside, I met with patient to discuss diagnosis, prognosis, and goals of care. ? ?Discussed SLB recommendations of n.p.o. with ice chips and that MBS study was recommended. Reviewed that patient declined study for today. Patient does not recall declining MBS study.  Described MBS study in detail.  Patient's reports she is not ready to perform that today but will think about it for tomorrow. ? ?CODE STATUS discussed again.  Patient says that she would like to live, would like to be put on a ventilator, and is not sure how long she would like to live on a ventilator before removal if needed. Reviewed that if patient's heart and lungs were to stop and she was to physically pass away then she would want CPR/code blue with intubation and  mechanical ventilator support. Patient confirmed full code.  ? ?Questions and concerns were addressed.  ? ?Goals are clear - full code, full scope, and daughter Connie Ruiz is next of kin/decision maker. I spoke with Connie Ruiz and confirmed full code/full scope and is in agreement with patient's wishes.  ? ?I conveyed to patient that PMT will shadow her chart and intervene if status declines and at patient/family request. She confirmed understanding.  ? ?Code Status: ?Full code ? ?Prognosis: ?Unable to determine ? ?Discharge Planning: ?To Be Determined ? ?Recommendations/Plan: ?Full code ?Full scope of all offered, available, and appropriate medical interventions ? ?Care plan was discussed with patient, nursing ? ?Physical Exam ?Vitals reviewed.  ?Constitutional:   ?   Appearance: Normal appearance. She is not ill-appearing.  ?HENT:  ?   Head: Normocephalic and atraumatic.  ?   Mouth/Throat:  ?   Mouth: Mucous membranes are moist.  ?Cardiovascular:  ?   Rate and Rhythm: Tachycardia present.  ?Pulmonary:  ?   Effort: Pulmonary effort is normal.  ?Abdominal:  ?   Palpations: Abdomen is soft.  ?Musculoskeletal:  ?   Comments: Generalized weakness  ?Skin: ?   General: Skin is warm and dry.  ?   Comments: Bilateral LE +2 edema  ?Neurological:  ?   Mental Status: She is alert. Mental status is at baseline.  ?Psychiatric:     ?   Mood and Affect: Mood normal.     ?   Behavior: Behavior normal.     ?  Thought Content: Thought content normal.     ?   Judgment: Judgment normal.  ?         ? ?Palliative Assessment/Data: 30% ? ? ? ?Total Time 25 minutes  ?Greater than 50%  of this time was spent counseling and coordinating care related to the above assessment and plan. ? ?Thank you for allowing the Palliative Medicine Team to assist in the care of this patient. ? ?Connie Carmine. [name], DNP, FNP-BC ?Palliative Medicine Team ?Team Phone # [phone] ?  ?

## 2021-08-10 NOTE — Progress Notes (Signed)
Speech Language Pathology Treatment: Dysphagia  ?Patient Details ?Name: Connie Ruiz ?MRN: [id number] ?DOB: 02-09-1961 ?Today's Date: 08/10/2021 ?Time: 1035-1110 ?SLP Time Calculation (min) (ACUTE ONLY): 35 min ? ?Assessment / Plan / Recommendation ?Clinical Impression ? Pt seen at bedside for skilled ST intervention targeting goal for PO readiness. Pt was awake and alert, able to vocalize "ah" and say her name. Pt was agreeable to having oral care with suction, and completed it herself after set up. Pt then accepted individual ice chips, which were tolerated without overt s/s aspiration. Sips of thin liquid via straw resulted in weak delayed cough. Nectar thick liquids were tolerated without overt s/s aspiration. SLP discussed completion of MBS to objectively assess swallow function, with the goal to begin PO intake and remove Cortrak. Pt indicated she did not want to do it today, and wasn't sure about tomorrow. SLP will follow up to check pt readiness to proceed. Pt does continue to present with significant weakness. Volitional cough is poor.  ? ?Recommend continuing with NPO status, except individual ice chips immediately following thorough oral care ?  ?HPI HPI: Patient is a 61 y.o. 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 She was admitted on 2/26 with fever, hypoxia and found o have aspiration PNA. She was intubated and started on vancomycin and zosyn. She was extubated on 12/27, but showed increased work of breathing and was put on BiPAP. She was taken off BiPAP late AM 2/28. SLP initially tried ot evaluate patient this AM but at that time, patient was in respiratory distress with oxygen saturations in upper 70's and marked thick yellow-green secretions. She continues to be NPO. ?  ?   ?SLP Plan ? Continue with current plan of care ? ?  ?  ?Recommendations for follow up therapy are one component of a  multi-disciplinary discharge planning process, led by the attending physician.  Recommendations may be updated based on patient status, additional functional criteria and insurance authorization. ?  ? ?Recommendations  ?Diet recommendations: NPO ?Medication Administration: Via alternative means  ?   ?    ?   ? ? ? ? Oral Care Recommendations: Oral care BID;Staff/trained caregiver to provide oral care;Oral care prior to ice chip/H20 ?Follow Up Recommendations: Skilled nursing-short term rehab (<3 hours/day) ?Assistance recommended at discharge: Frequent or constant Supervision/Assistance ?SLP Visit Diagnosis: Dysphagia, unspecified (R13.10) ?Plan: Continue with current plan of care ? ? ? ? ?  ?  ?Connie Ruiz, MSP, CCC-SLP ?Speech Language Pathologist ?Office: [phone] ? ?Shonna Chock ? ?08/10/2021, 11:11 AM ?

## 2021-08-10 NOTE — Care Management Important Message (Signed)
Important Message ? ?Patient Details  ?Name: Connie Ruiz ?MRN: [id number] ?Date of Birth: [birth year] ? ? ?Medicare Important Message Given:  Yes ? ? ? ? ?Laquinta Hazell ?08/10/2021, 2:42 PM ?

## 2021-08-10 NOTE — Progress Notes (Addendum)
PROGRESS NOTE        PATIENT DETAILS Name: Connie Ruiz Age: 61 y.o. Sex: female Date of Birth: October 03, 1960 Admit Date: 08/02/2021 Admitting Physician Chesley Mires, MD NWG:[id number], Malka So, MD  Brief Summary: Patient is a 61 y.o.  female with history of kidney transplant, VTE/antiphospholipid syndrome on chronic Coumadin therapy, CVA with residual aphasia-slight right-sided deficits-resident of SNF who presented to the hospital with acute respiratory distress and fever, she was intubated in the emergency room-and managed in the ICU.  She was subsequently stabilized-extubated on 2/28-unfortunately-she required insertion of NG tube for feedings.  Her hypoxia gradually improved-and patient was transferred to [facility] service on 3/3.  Further hospital course was complicated by development of  volume overload-she was found to have new onset systolic heart failure.  See below for further details.  Significant Hospital events: 2/26>> admit to ICU-intubated. 2/27>> On vent, remains on Levophed 85mg, Vanc, Cefepime  2/28>> Ext[facility]03[facility]    Significant imaging studies: 2/26>> CXR: RLL infiltrate. 2/26>> CT head: No acute intracranial findings, chronic left MCA territory infarct. 3/01>> CT head: No acute intracranial abnormalities. 3/01>> RUQ ultrasound: Subtle nodularity of the liver-nonspecific-could reflect early cirrhosis. 3/05>> Echo: EF 35-40%, RVSP 40.7 mmHg.  Significant microbiology data: 2/26>> COVID/influenza PCR: Negative 2/26>> blood culture: Negative 2/27>> urine culture: Negative  Procedures: ETT>>2/26-2/28  Consults:  CCM, nephrology  Subjective: Had some chest pain earlier this morning that resolved after nitroglycerin sublingually.  Objective: Vitals: Blood pressure 114/67, pulse (!) 119, temperature 98.8 F (37.1 C), temperature source Oral, resp. rate 14, height 5' (1.524 m), weight 85.8 kg, SpO2 98 %.   Exam: Gen Exam:Alert  awake-not in any distress HEENT:atraumatic, normocephalic Chest: Continues to have some scattered rhonchi. CVS:S1S2 regular Abdomen:soft non tender, non distended Extremities:++ edema Neurology: Non focal Skin: no rash   Pertinent Labs/Radiology: CBC Latest Ref Rng & Units 08/09/2021 08/09/2021 08/08/2021  WBC 4.0 - 10.5 K/uL 14.2(H) 9.3 12.3(H)  Hemoglobin 12.0 - 15.0 g/dL 8.2(L) 5.3(LL) 8.5(L)  Hematocrit 36.0 - 46.0 % 25.3(L) 16.6(L) 26.2(L)  Platelets 150 - 400 K/uL 198 134(L) 217    Lab Results  Component Value Date   NA 141 08/10/2021   K 4.0 08/10/2021   CL 107 08/10/2021   CO2 28 08/10/2021      Assessment/Plan: Acute hypoxic respiratory failure likely due to aspiration pneumonia: Hypoxia has improved-completed 7 days of Zosyn.  Cultures negative so far.  Unfortunately-with prior history of CVA/dysarthria/severe debility/deconditioning-remains at risk for aspiration.  Continue to maintain aspiration precautions-keep head end up at 60-90 degree angle, continue Robinul, encourage incentive spirometry/flutter valve.    Septic shock (present on admission): Sepsis physiology resolved-cultures negative so far-completed antibiotic course..Earnestine LeysCKD stage IIIa-history of renal transplant: AKI likely hemodynamically mediated-renal function has improved.  No longer on stress dose steroids-on chronic dosing of prednisone, on tacrolimus.  Neurology has signed off.  Follow renal function closely as patient is on IV Lasix.    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  Unclear whether this is Takotsubo cardiomyopathy or ischemic etiology.  Cardiology following-with tentative ischemic evaluation if/when she is more stable.  Sinus tachycardia: Worsening sinus tachycardia on 3/4-initially thought to have a flutter with RVR but upon cardiology review-felt to be sinus tachycardia.  Continues to have mild sinus  tachycardia-on  bisoprolol.  Likely provoked by acute illness/PNA/physiological stress.    Prolonged QTc: Likely due to hypomagnesemia-replete magnesium-recheck EKG tomorrow morning.  Avoid QTc prolonging agents.  Atypical chest pain: Developed on 3/419-and again on 3/6 AM.  Relieved by nitroglycerin.  Continue supportive care.  Dysphagia: Likely chronic-worsened due to severe debility from acute illness-cortak tube in place-feedings ongoing.  Await SLP reevaluation to see if patient is appropriate for initiation of oral intake.  If she continues to have persistent dysphagia-we will need to consider PEG tube placement.  Daughter/patient aware that placing a feeding tube would not decrease the risk of aspiration.  Prolonged QTc: Likely due to hypomagnesemia-replete magnesium-recheck EKG tomorrow morning.  Hypokalemia/hypophosphatemia: Repleted.  Normocytic anemia: Likely due to critical illness-no evidence of blood loss-follow closely-patient is a [name].  DM-2 (A1c 6.0 on 05/27/2021) with steroid-induced hyperglycemia: Continue SSI-steroids being tapered down-CBGs should improve.  Recent Labs    08/09/21 2308 08/10/21 0330 08/10/21 0801  GLUCAP 213* 202* 159*      HTN: BP stable-continue bisoprolol.  Coreg discontinued due to bronchospasm.  History of PE-antiphospholipid syndrome: INR now therapeutic-no longer on Lovenox-remains on Coumadin per pharmacy.  History of multiple CVA's with chronic aphasia/right-sided weakness: At baseline-CT head x2 negative for acute abnormalities.  Resident of SNF.  Vascular dementia: We will resume Aricept, Prozac, mirtazapine and amitriptyline over the next few days.  Hypothyroidism: On levothyroxine.  TSH mildly elevated-further optimization of levothyroxine dosing can be done by PCP in the outpatient setting, unclear to me when it was last adjusted.  Refusal of blood transfusions as patient is [name]  Debility/Deconditioning: Chronically  frail and weak-Per daughter-patient has been very minimally ambulatory for the past 1 year and is mostly bed to wheelchair bound.  Her last CVA was 8 years ago.  Obtaining PT/OT has she appears to be much more weaker than her usual baseline.   Palliative care: Full code-difficult situation-frail/bedbound-now with significant debility/deconditioning-and severe dysphagia-continues to accumulate secretions.  Long discussion with daughter on 3/4-she is aware of tenuous clinical situation-palliative care following.  Nutrition Status: Nutrition Problem: Moderate Malnutrition Etiology: chronic illness (CHF, CVA) Signs/Symptoms: mild fat depletion, moderate muscle depletion, percent weight loss (16% weight loss in 6 months) Percent weight loss: 16 % (6 months) Interventions: Tube feeding, Prostat   BMI: Estimated body mass index is 36.94 kg/m as calculated from the following:   Height as of this encounter: 5' (1.524 m).   Weight as of this encounter: 85.8 kg.   Code status:   Code Status: Full Code   DVT Prophylaxis: Place and maintain sequential compression device Start: 08/02/21 1242 SCDs Start: 08/02/21 1209     Family Communication: Daughter Jamil Castillo  [phone] 3/6   Disposition Plan: Status is: Inpatient Remains inpatient appropriate because: Resolving septic shock due to aspiration pneumonia-remains n.p.o. due to severe dysphagia.  On NG tube feedings.    Planned Discharge Destination:Skilled nursing facility   Diet: Diet Order             Diet NPO time specified  Diet effective now                     Antimicrobial agents: Anti-infectives (From admission, onward)    Start     Dose/Rate Route Frequency Ordered Stop   08/03/21 1500  vancomycin (VANCOREADY) IVPB 750 mg/150 mL  Status:  Discontinued        750 mg 150 mL/hr over 60 Minutes Intravenous Every 24 hours  08/02/21 1456 08/03/21 0912   08/03/21 0630  cefTRIAXone (ROCEPHIN) 2 g in sodium  chloride 0.9 % 100 mL IVPB  Status:  Discontinued        2 g 200 mL/hr over 30 Minutes Intravenous Every 24 hours 08/02/21 1209 08/02/21 1246   08/02/21 2100  piperacillin-tazobactam (ZOSYN) IVPB 3.375 g       See Hyperspace for full Linked Orders Report.   3.375 g 12.5 mL/hr over 240 Minutes Intravenous Every 8 hours 08/02/21 1253 08/08/21 2359   08/02/21 1345  vancomycin (VANCOREADY) IVPB 1750 mg/350 mL        1,750 mg 175 mL/hr over 120 Minutes Intravenous  Once 08/02/21 1343 08/02/21 1721   08/02/21 1300  vancomycin (VANCOREADY) IVPB 2000 mg/400 mL  Status:  Discontinued        2,000 mg 200 mL/hr over 120 Minutes Intravenous  Once 08/02/21 1253 08/02/21 1343   08/02/21 1300  piperacillin-tazobactam (ZOSYN) IVPB 3.375 g       See Hyperspace for full Linked Orders Report.   3.375 g 100 mL/hr over 30 Minutes Intravenous  Once 08/02/21 1253 08/02/21 1555   08/02/21 0630  cefTRIAXone (ROCEPHIN) 2 g in sodium chloride 0.9 % 100 mL IVPB        2 g 200 mL/hr over 30 Minutes Intravenous  Once 08/02/21 0618 08/02/21 0807   08/02/21 0630  azithromycin (ZITHROMAX) 500 mg in sodium chloride 0.9 % 250 mL IVPB  Status:  Discontinued        500 mg 250 mL/hr over 60 Minutes Intravenous Every 24 hours 08/02/21 0618 08/02/21 1246        MEDICATIONS: Scheduled Meds:  bisoprolol  5 mg Per Tube Daily   chlorhexidine  15 mL Mouth Rinse BID   Chlorhexidine Gluconate Cloth  6 each Topical Daily   feeding supplement (PROSource TF)  45 mL Per Tube BID   furosemide  40 mg Intravenous BID   glycopyrrolate  0.1 mg Intravenous TID   insulin aspart  0-9 Units Subcutaneous Q4H   levalbuterol  0.63 mg Nebulization TID   And   ipratropium  0.5 mg Nebulization TID   levothyroxine  150 mcg Per Tube Q0600   mouth rinse  15 mL Mouth Rinse q12n4p   pantoprazole sodium  40 mg Per Tube Daily   Phytonadione  100 mcg Oral QPM   predniSONE  5 mg Per Tube Q breakfast   sodium chloride flush  3 mL Intravenous Q12H    tacrolimus  1 mg Sublingual BID   Warfarin - Pharmacist Dosing Inpatient   Does not apply q1600   Continuous Infusions:  sodium chloride Stopped (08/06/21 1000)   feeding supplement (OSMOLITE 1.5 CAL) 1,000 mL (08/10/21 1018)   magnesium sulfate bolus IVPB 4 g (08/10/21 0935)   PRN Meds:.acetaminophen **OR** acetaminophen, albuterol, docusate, fentaNYL (SUBLIMAZE) injection, lidocaine (PF), nitroGLYCERIN, polyethylene glycol, prochlorperazine   I have personally reviewed following labs and imaging studies  LABORATORY DATA: CBC: Recent Labs  Lab 08/04/21 1425 08/05/21 0341 08/06/21 0318 08/07/21 0354 08/08/21 0420 08/09/21 0046 08/09/21 0206  WBC 24.5*   < > 16.3* 10.9* 12.3* 9.3 14.2*  NEUTROABS 22.6*  --   --   --   --   --   --   HGB 8.3*   < > 8.0* 8.8* 8.5* 5.3* 8.2*  HCT 25.8*   < > 24.5* 26.6* 26.2* 16.6* 25.3*  MCV 89.0   < > 87.5 87.8 87.9 91.2 88.5  PLT  188   < > 163 170 217 134* 198   < > = values in this interval not displayed.     Basic Metabolic Panel: Recent Labs  Lab 08/06/21 1428 08/07/21 0354 08/07/21 1620 08/08/21 0420 08/09/21 0206 08/10/21 0433  NA 141 142  --  143 142 141  K 3.7 2.8*  --  3.8 3.8 4.0  CL 110 112*  --  110 110 107  CO2 22 22  --  '22 25 28  '$ GLUCOSE 234* 283*  --  279* 266* 219*  BUN 29* 29*  --  28* 26* 27*  CREATININE 1.11* 1.07*  --  1.00 0.98 0.91  CALCIUM 8.4* 8.2*  --  8.2* 8.0* 8.1*  MG 2.2 2.1 2.0 2.0 1.7 1.6*  PHOS 2.7 1.9* 2.9 2.4* 4.0 4.1     GFR: Estimated Creatinine Clearance: 63.9 mL/min (by C-G formula based on SCr of 0.91 mg/dL).  Liver Function Tests: Recent Labs  Lab 08/05/21 0341 08/09/21 0206  AST 12* 10*  ALT 11 9  ALKPHOS 34* 35*  BILITOT 0.6 0.4  PROT 4.9* 4.8*  ALBUMIN 2.1* 1.8*    No results for input(s): LIPASE, AMYLASE in the last 168 hours. No results for input(s): AMMONIA in the last 168 hours.  Coagulation Profile: Recent Labs  Lab 08/07/21 0354 08/08/21 0420  08/09/21 0046 08/09/21 0206 08/10/21 0433  INR 1.6* 2.5* 3.1* 2.6* 2.1*     Cardiac Enzymes: No results for input(s): CKTOTAL, CKMB, CKMBINDEX, TROPONINI in the last 168 hours.  BNP (last 3 results) No results for input(s): PROBNP in the last 8760 hours.  Lipid Profile: Recent Labs    08/10/21 0433  CHOL 143  HDL 37*  LDLCALC 72  TRIG 169*  CHOLHDL 3.9    Thyroid Function Tests: Recent Labs    08/08/21 1832  TSH 8.378*  FREET4 1.04     Anemia Panel: No results for input(s): VITAMINB12, FOLATE, FERRITIN, TIBC, IRON, RETICCTPCT in the last 72 hours.  Urine analysis:    Component Value Date/Time   COLORURINE YELLOW 08/03/2021 0032   APPEARANCEUR CLOUDY (A) 08/03/2021 0032   LABSPEC 1.029 08/03/2021 0032   PHURINE 5.0 08/03/2021 0032   GLUCOSEU NEGATIVE 08/03/2021 0032   GLUCOSEU NEGATIVE 10/22/2013 1621   HGBUR NEGATIVE 08/03/2021 0032   BILIRUBINUR NEGATIVE 08/03/2021 0032   KETONESUR 5 (A) 08/03/2021 0032   PROTEINUR 30 (A) 08/03/2021 0032   UROBILINOGEN 1.0 03/27/2015 1534   NITRITE NEGATIVE 08/03/2021 0032   LEUKOCYTESUR TRACE (A) 08/03/2021 0032    Sepsis Labs: Lactic Acid, Venous    Component Value Date/Time   LATICACIDVEN 0.8 08/02/2021 0511    MICROBIOLOGY: Recent Results (from the past 240 hour(s))  Blood Culture (routine x 2)     Status: None   Collection Time: 08/02/21  6:04 AM   Specimen: BLOOD LEFT ARM  Result Value Ref Range Status   Specimen Description BLOOD LEFT ARM  Final   Special Requests   Final    BOTTLES DRAWN AEROBIC AND ANAEROBIC Blood Culture adequate volume   Culture   Final    NO GROWTH 5 DAYS Performed at [facility], [address] [place], [place] [address]    Report Status 08/07/2021 FINAL  Final  Blood Culture (routine x 2)     Status: None   Collection Time: 08/02/21  6:05 AM   Specimen: BLOOD LEFT HAND  Result Value Ref Range Status   Specimen Description BLOOD LEFT HAND  Final   Special Requests  AEROBIC BOTTLE ONLY Blood Culture adequate volume  Final   Culture   Final    NO GROWTH 5 DAYS Performed at [facility], [place] [address] [place], [place] [address]    Report Status 08/07/2021 FINAL  Final  MRSA Next Gen by PCR, Nasal     Status: None   Collection Time: 08/02/21 12:40 PM   Specimen: Nasal Mucosa; Nasal Swab  Result Value Ref Range Status   MRSA by PCR Next Gen NOT DETECTED NOT DETECTED Final    Comment: (NOTE) The GeneXpert MRSA Assay (FDA approved for NASAL specimens only), is one component of a comprehensive MRSA colonization surveillance program. It is not intended to diagnose MRSA infection nor to guide or monitor treatment for MRSA infections. Test performance is not FDA approved in patients less than 58 years old. Performed at [facility], [place] [address] [place], [place] [address]   Resp Panel by RT-PCR (Flu A&B, Covid) Nasopharyngeal Swab     Status: None   Collection Time: 08/02/21 12:51 PM   Specimen: Nasopharyngeal Swab; Nasopharyngeal(NP) swabs in vial transport medium  Result Value Ref Range Status   SARS Coronavirus 2 by RT PCR NEGATIVE NEGATIVE Final    Comment: (NOTE) SARS-CoV-2 target nucleic acids are NOT DETECTED.  The SARS-CoV-2 RNA is generally detectable in upper respiratory specimens during the acute phase of infection. The lowest concentration of SARS-CoV-2 viral copies this assay can detect is 138 copies/mL. A negative result does not preclude SARS-Cov-2 infection and should not be used as the sole basis for treatment or other patient management decisions. A negative result may occur with  improper specimen collection/handling, submission of specimen other than nasopharyngeal swab, presence of viral mutation(s) within the areas targeted by this assay, and inadequate number of viral copies(<138 copies/mL). A negative result must be combined with clinical observations, patient history, and epidemiological information. The  expected result is Negative.  Fact Sheet for Patients:  EntrepreneurPulse.com.au  Fact Sheet for Healthcare Providers:  IncredibleEmployment.be  This test is no t yet approved or cleared by the [place] FDA and  has been authorized for detection and/or diagnosis of SARS-CoV-2 by FDA under an Emergency Use Authorization (EUA). This EUA will remain  in effect (meaning this test can be used) for the duration of the COVID-19 declaration under Section 564(b)(1) of the Act, 21 U.S.C.section 360bbb-3(b)(1), unless the authorization is terminated  or revoked sooner.       Influenza A by PCR NEGATIVE NEGATIVE Final   Influenza B by PCR NEGATIVE NEGATIVE Final    Comment: (NOTE) The Xpert Xpress SARS-CoV-2/FLU/RSV plus assay is intended as an aid in the diagnosis of influenza from Nasopharyngeal swab specimens and should not be used as a sole basis for treatment. Nasal washings and aspirates are unacceptable for Xpert Xpress SARS-CoV-2/FLU/RSV testing.  Fact Sheet for Patients: EntrepreneurPulse.com.au  Fact Sheet for Healthcare Providers: IncredibleEmployment.be  This test is not yet approved or cleared by the [place] FDA and has been authorized for detection and/or diagnosis of SARS-CoV-2 by FDA under an Emergency Use Authorization (EUA). This EUA will remain in effect (meaning this test can be used) for the duration of the COVID-19 declaration under Section 564(b)(1) of the Act, 21 U.S.C. section 360bbb-3(b)(1), unless the authorization is terminated or revoked.  Performed at [facility], [place] [address] [place], [place] [address]   Urine Culture     Status: None  Collection Time: 08/03/21 12:21 AM   Specimen: In/Out Cath Urine  Result Value Ref Range Status   Specimen Description IN/OUT CATH URINE  Final   Special Requests NONE  Final   Culture   Final    NO GROWTH Performed at [facility], [place] [address], [place], [place] [address]    Report Status 08/04/2021 FINAL  Final    RADIOLOGY STUDIES/RESULTS: DG Chest Port 1 View  Result Date: 08/09/2021 CLINICAL DATA:  Altered mental status. EXAM: PORTABLE CHEST 1 VIEW COMPARISON:  08/05/2021 FINDINGS: There is a feeding tube with tip below the level of the hemidiaphragms. Right arm PICC line tip is at the cavoatrial junction. Stable cardiomediastinal contours. Bilateral pleural effusions, right greater than left, are again noted and not significantly changed from previous exam. IMPRESSION: 1. Stable bilateral pleural effusions, right greater than left. 2. Stable support apparatus. 3. No change in aeration to the lungs. Electronically Signed   By: Kerby Moors M.D.   On: 08/09/2021 08:41   ECHOCARDIOGRAM COMPLETE  Result Date: 08/09/2021    ECHOCARDIOGRAM REPORT   Patient Name:   Connie Ruiz Date of Exam: 08/09/2021 Medical Rec #:  [id number]    Height:       60.0 in Accession #:    [id number]   Weight:       186.5 lb Date of Birth:  1960/06/22    BSA:          1.812 m Patient Age:    44 years     BP:           100/57 mmHg Patient Gender: F            HR:           129 bpm. Exam Location:  Inpatient Procedure: 2D Echo, Cardiac Doppler and Color Doppler Indications:    Atrial fibrillation  History:        Patient has prior history of Echocardiogram examinations, most                 recent 02/03/2021. Signs/Symptoms:Shortness of Breath; Risk                 Factors:Hypertension, Diabetes and Dyslipidemia. Hx kidney                 transplant and CVA.  Sonographer:    Clayton Lefort RDCS (AE) Referring Phys: [place]  1. Possible takotsubo or stress induced cardiomyopathy.  2. Left ventricular ejection fraction, by estimation, is 35 to 40%. The left ventricle has moderately decreased function. The left ventricle has no regional wall motion abnormalities. There is severe left ventricular hypertrophy. Left ventricular  diastolic parameters are indeterminate.  3. Right ventricular systolic function is normal. The right ventricular size is normal. There is mildly elevated pulmonary artery systolic pressure. The estimated right ventricular systolic pressure is 37.9 mmHg.  4. The mitral valve is normal in structure. Mild mitral valve regurgitation. No evidence of mitral stenosis.  5. Tricuspid valve regurgitation is moderate.  6. The aortic valve is tricuspid. There is mild calcification of the aortic valve. Aortic valve regurgitation is mild. Aortic valve sclerosis is present, with no evidence of aortic valve stenosis.  7. The inferior vena cava is normal in size with greater than 50% respiratory variability, suggesting right atrial pressure of 3 mmHg. Comparison(s): Prior images reviewed side by side. The left ventricular function is worsened. FINDINGS  Left Ventricle: Left ventricular ejection  fraction, by estimation, is 35 to 40%. The left ventricle has moderately decreased function. The left ventricle has no regional wall motion abnormalities. The left ventricular internal cavity size was normal in size. There is severe left ventricular hypertrophy. Left ventricular diastolic parameters are indeterminate.  LV Wall Scoring: The mid and distal anterior wall, entire apex, mid and distal inferior wall, mid anterolateral segment, and mid inferoseptal segment are akinetic. Right Ventricle: The right ventricular size is normal. No increase in right ventricular wall thickness. Right ventricular systolic function is normal. There is mildly elevated pulmonary artery systolic pressure. The tricuspid regurgitant velocity is 2.86  m/s, and with an assumed right atrial pressure of 8 mmHg, the estimated right ventricular systolic pressure is 07.3 mmHg. Left Atrium: Left atrial size was normal in size. Right Atrium: Right atrial size was normal in size. Pericardium: There is no evidence of pericardial effusion. Mitral Valve: The mitral valve is  normal in structure. Mild mitral valve regurgitation. No evidence of mitral valve stenosis. Tricuspid Valve: The tricuspid valve is normal in structure. Tricuspid valve regurgitation is moderate . No evidence of tricuspid stenosis. Aortic Valve: The aortic valve is tricuspid. There is mild calcification of the aortic valve. Aortic valve regurgitation is mild. Aortic valve sclerosis is present, with no evidence of aortic valve stenosis. Aortic valve mean gradient measures 4.0 mmHg. Aortic valve peak gradient measures 6.6 mmHg. Aortic valve area, by VTI measures 2.69 cm. Pulmonic Valve: The pulmonic valve was normal in structure. Pulmonic valve regurgitation is not visualized. No evidence of pulmonic stenosis. Aorta: The aortic root is normal in size and structure. Venous: The inferior vena cava is normal in size with greater than 50% respiratory variability, suggesting right atrial pressure of 3 mmHg. IAS/Shunts: No atrial level shunt detected by color flow Doppler. Additional Comments: Possible takotsubo or stress induced cardiomyopathy.  LEFT VENTRICLE PLAX 2D LVIDd:         3.40 cm LVIDs:         2.50 cm LV PW:         2.00 cm LV IVS:        1.80 cm LVOT diam:     2.10 cm LV SV:         48 LV SV Index:   26 LVOT Area:     3.46 cm  LV Volumes (MOD) LV vol d, MOD A2C: 48.3 ml LV vol d, MOD A4C: 86.2 ml LV vol s, MOD A2C: 28.4 ml LV vol s, MOD A4C: 49.1 ml LV SV MOD A2C:     19.9 ml LV SV MOD A4C:     86.2 ml LV SV MOD BP:      29.0 ml RIGHT VENTRICLE             IVC RV Basal diam:  2.20 cm     IVC diam: 1.30 cm RV S prime:     15.60 cm/s TAPSE (M-mode): 1.5 cm LEFT ATRIUM           Index        RIGHT ATRIUM          Index LA diam:      3.80 cm 2.10 cm/m   RA Area:     7.32 cm LA Vol (A2C): 17.6 ml 9.71 ml/m   RA Volume:   12.30 ml 6.79 ml/m LA Vol (A4C): 29.6 ml 16.34 ml/m  AORTIC VALVE AV Area (Vmax):    2.87 cm AV Area (Vmean):   2.76 cm  AV Area (VTI):     2.69 cm AV Vmax:           128.00 cm/s AV Vmean:           89.000 cm/s AV VTI:            0.178 m AV Peak Grad:      6.6 mmHg AV Mean Grad:      4.0 mmHg LVOT Vmax:         106.00 cm/s LVOT Vmean:        71.000 cm/s LVOT VTI:          0.138 m LVOT/AV VTI ratio: 0.78  AORTA Ao Root diam: 3.20 cm Ao Asc diam:  2.80 cm TRICUSPID VALVE TR Peak grad:   32.7 mmHg TR Vmax:        286.00 cm/s  SHUNTS Systemic VTI:  0.14 m Systemic Diam: 2.10 cm Candee Furbish MD Electronically signed by Candee Furbish MD Signature Date/Time: 08/09/2021/11:45:06 AM    Final      LOS: 8 days   Oren Binet, MD  [facility]    To contact the attending provider between 7A-7P or the covering provider during after hours 7P-7A, please log into the web site [email or web address] and access using universal [place] password for that web site. If you do not have the password, please call the hospital operator.  08/10/2021, 10:29 AM

## 2021-08-10 NOTE — Progress Notes (Addendum)
Progress Note  Patient Name: Connie Ruiz Date of Encounter: 08/10/2021  [facility] Cardiologist: Quay Burow, MD   Subjective   Expressive aphasia.  Reports intermittent chest pain that is worse with inspiration.  Also reports chest wall tenderness and shortness of breath.  Inpatient Medications    Scheduled Meds:  bisoprolol  5 mg Per Tube Daily   chlorhexidine  15 mL Mouth Rinse BID   Chlorhexidine Gluconate Cloth  6 each Topical Daily   feeding supplement (PROSource TF)  45 mL Per Tube BID   furosemide  40 mg Intravenous BID   glycopyrrolate  0.1 mg Intravenous TID   insulin aspart  0-9 Units Subcutaneous Q4H   levalbuterol  0.63 mg Nebulization TID   And   ipratropium  0.5 mg Nebulization TID   levothyroxine  150 mcg Per Tube Q0600   mouth rinse  15 mL Mouth Rinse q12n4p   pantoprazole sodium  40 mg Per Tube Daily   Phytonadione  100 mcg Oral QPM   predniSONE  5 mg Per Tube Q breakfast   sodium chloride flush  3 mL Intravenous Q12H   tacrolimus  1 mg Sublingual BID   Warfarin - Pharmacist Dosing Inpatient   Does not apply q1600   Continuous Infusions:  sodium chloride Stopped (08/06/21 1000)   feeding supplement (OSMOLITE 1.5 CAL) 1,000 mL (08/09/21 0741)   magnesium sulfate bolus IVPB     PRN Meds: acetaminophen **OR** acetaminophen, albuterol, docusate, fentaNYL (SUBLIMAZE) injection, lidocaine (PF), nitroGLYCERIN, polyethylene glycol, prochlorperazine   Vital Signs    Vitals:   08/09/21 2303 08/10/21 0328 08/10/21 0500 08/10/21 0725  BP: 116/83 108/78  114/67  Pulse: (!) 120 (!) 119  (!) 119  Resp: '17 14  14  '$ Temp: 98.9 F (37.2 C) 99.9 F (37.7 C)  98.8 F (37.1 C)  TempSrc: Oral Oral  Oral  SpO2: 96% 95%  96%  Weight:   85.8 kg   Height:        Intake/Output Summary (Last 24 hours) at 08/10/2021 0836 Last data filed at 08/09/2021 2306 Gross per 24 hour  Intake 0 ml  Output 1300 ml  Net -1300 ml   Last 3 Weights 08/10/2021 08/09/2021 08/08/2021   Weight (lbs) 189 lb 2.5 oz 186 lb 8.2 oz 186 lb 1.1 oz  Weight (kg) 85.8 kg 84.6 kg 84.4 kg      Telemetry    Sinus tachycardia- Personally Reviewed  ECG    Sinus tachycardia.  Rate 119 bpm.  ST elevation V1 through V3, most prominent in V2.  Anterolateral T wave inversion. - Personally Reviewed  Physical Exam   VS:  BP 114/67 (BP Location: Left Arm)    Pulse (!) 119    Temp 98.8 F (37.1 C) (Oral)    Resp 14    Ht 5' (1.524 m)    Wt 85.8 kg    SpO2 98%    BMI 36.94 kg/m  , BMI Body mass index is 36.94 kg/m. GENERAL: Chronically ill-appearing. HEENT: Bilateral proptosis.  Pupils equal round and reactive, fundi not visualized, oral mucosa unremarkable NECK:  No jugular venous distention, waveform within normal limits, carotid upstroke brisk and symmetric, no bruits, no thyromegaly LUNGS:  Clear to auscultation bilaterally HEART:  RRR.  PMI not displaced or sustained,S1 and S2 within normal limits, no S3, no S4, no clicks, no rubs, no murmurs ABD:  Flat, positive bowel sounds normal in frequency in pitch, no bruits, no rebound, no guarding,  no midline pulsatile mass, no hepatomegaly, no splenomegaly EXT:  2 plus pulses throughout, no edema, no cyanosis no clubbing SKIN:  No rashes no nodules NEURO:  Cranial nerves II through XII grossly intact, motor grossly intact throughout [facility]:  Cognitively intact, oriented to person place and time  Labs    High Sensitivity Troponin:   Recent Labs  Lab 08/09/21 0206 08/09/21 1240  TROPONINIHS 102* 78*     Chemistry Recent Labs  Lab 08/05/21 0341 08/05/21 1645 08/08/21 0420 08/09/21 0206 08/10/21 0433  NA 139   < > 143 142 141  K 3.2*   < > 3.8 3.8 4.0  CL 108   < > 110 110 107  CO2 20*   < > '22 25 28  '$ GLUCOSE 115*   < > 279* 266* 219*  BUN 30*   < > 28* 26* 27*  CREATININE 1.35*   < > 1.00 0.98 0.91  CALCIUM 8.0*   < > 8.2* 8.0* 8.1*  MG 2.2   < > 2.0 1.7 1.6*  PROT 4.9*  --   --  4.8*  --   ALBUMIN 2.1*  --   --  1.8*  --    AST 12*  --   --  10*  --   ALT 11  --   --  9  --   ALKPHOS 34*  --   --  35*  --   BILITOT 0.6  --   --  0.4  --   GFRNONAA 45*   < > >60 >60 >60  ANIONGAP 11   < > '11 7 6   '$ < > = values in this interval not displayed.    Lipids No results for input(s): CHOL, TRIG, HDL, LABVLDL, LDLCALC, CHOLHDL in the last 168 hours.  Hematology Recent Labs  Lab 08/08/21 0420 08/09/21 0046 08/09/21 0206  WBC 12.3* 9.3 14.2*  RBC 2.98* 1.82* 2.86*  HGB 8.5* 5.3* 8.2*  HCT 26.2* 16.6* 25.3*  MCV 87.9 91.2 88.5  MCH 28.5 29.1 28.7  MCHC 32.4 31.9 32.4  RDW 16.8* 16.9* 16.9*  PLT 217 134* 198   Thyroid  Recent Labs  Lab 08/08/21 1832  TSH 8.378*  FREET4 1.04    BNPNo results for input(s): BNP, PROBNP in the last 168 hours.  DDimer No results for input(s): DDIMER in the last 168 hours.   Radiology    DG Chest Port 1 View  Result Date: 08/09/2021 CLINICAL DATA:  Altered mental status. EXAM: PORTABLE CHEST 1 VIEW COMPARISON:  08/05/2021 FINDINGS: There is a feeding tube with tip below the level of the hemidiaphragms. Right arm PICC line tip is at the cavoatrial junction. Stable cardiomediastinal contours. Bilateral pleural effusions, right greater than left, are again noted and not significantly changed from previous exam. IMPRESSION: 1. Stable bilateral pleural effusions, right greater than left. 2. Stable support apparatus. 3. No change in aeration to the lungs. Electronically Signed   By: Kerby Moors M.D.   On: 08/09/2021 08:41   ECHOCARDIOGRAM COMPLETE  Result Date: 08/09/2021    ECHOCARDIOGRAM REPORT   Patient Name:   Connie Ruiz Date of Exam: 08/09/2021 Medical Rec #:  [id number]    Height:       60.0 in Accession #:    [id number]   Weight:       186.5 lb Date of Birth:  1961/02/01    BSA:          1.812 m Patient Age:  61 years     BP:           100/57 mmHg Patient Gender: F            HR:           129 bpm. Exam Location:  Inpatient Procedure: 2D Echo, Cardiac Doppler and Color  Doppler Indications:    Atrial fibrillation  History:        Patient has prior history of Echocardiogram examinations, most                 recent 02/03/2021. Signs/Symptoms:Shortness of Breath; Risk                 Factors:Hypertension, Diabetes and Dyslipidemia. Hx kidney                 transplant and CVA.  Sonographer:    Clayton Lefort RDCS (AE) Referring Phys: [place]  1. Possible takotsubo or stress induced cardiomyopathy.  2. Left ventricular ejection fraction, by estimation, is 35 to 40%. The left ventricle has moderately decreased function. The left ventricle has no regional wall motion abnormalities. There is severe left ventricular hypertrophy. Left ventricular diastolic parameters are indeterminate.  3. Right ventricular systolic function is normal. The right ventricular size is normal. There is mildly elevated pulmonary artery systolic pressure. The estimated right ventricular systolic pressure is 73.5 mmHg.  4. The mitral valve is normal in structure. Mild mitral valve regurgitation. No evidence of mitral stenosis.  5. Tricuspid valve regurgitation is moderate.  6. The aortic valve is tricuspid. There is mild calcification of the aortic valve. Aortic valve regurgitation is mild. Aortic valve sclerosis is present, with no evidence of aortic valve stenosis.  7. The inferior vena cava is normal in size with greater than 50% respiratory variability, suggesting right atrial pressure of 3 mmHg. Comparison(s): Prior images reviewed side by side. The left ventricular function is worsened. FINDINGS  Left Ventricle: Left ventricular ejection fraction, by estimation, is 35 to 40%. The left ventricle has moderately decreased function. The left ventricle has no regional wall motion abnormalities. The left ventricular internal cavity size was normal in size. There is severe left ventricular hypertrophy. Left ventricular diastolic parameters are indeterminate.  LV Wall Scoring: The mid and  distal anterior wall, entire apex, mid and distal inferior wall, mid anterolateral segment, and mid inferoseptal segment are akinetic. Right Ventricle: The right ventricular size is normal. No increase in right ventricular wall thickness. Right ventricular systolic function is normal. There is mildly elevated pulmonary artery systolic pressure. The tricuspid regurgitant velocity is 2.86  m/s, and with an assumed right atrial pressure of 8 mmHg, the estimated right ventricular systolic pressure is 32.9 mmHg. Left Atrium: Left atrial size was normal in size. Right Atrium: Right atrial size was normal in size. Pericardium: There is no evidence of pericardial effusion. Mitral Valve: The mitral valve is normal in structure. Mild mitral valve regurgitation. No evidence of mitral valve stenosis. Tricuspid Valve: The tricuspid valve is normal in structure. Tricuspid valve regurgitation is moderate . No evidence of tricuspid stenosis. Aortic Valve: The aortic valve is tricuspid. There is mild calcification of the aortic valve. Aortic valve regurgitation is mild. Aortic valve sclerosis is present, with no evidence of aortic valve stenosis. Aortic valve mean gradient measures 4.0 mmHg. Aortic valve peak gradient measures 6.6 mmHg. Aortic valve area, by VTI measures 2.69 cm. Pulmonic Valve: The pulmonic valve was normal in structure. Pulmonic valve regurgitation is  not visualized. No evidence of pulmonic stenosis. Aorta: The aortic root is normal in size and structure. Venous: The inferior vena cava is normal in size with greater than 50% respiratory variability, suggesting right atrial pressure of 3 mmHg. IAS/Shunts: No atrial level shunt detected by color flow Doppler. Additional Comments: Possible takotsubo or stress induced cardiomyopathy.  LEFT VENTRICLE PLAX 2D LVIDd:         3.40 cm LVIDs:         2.50 cm LV PW:         2.00 cm LV IVS:        1.80 cm LVOT diam:     2.10 cm LV SV:         48 LV SV Index:   26 LVOT Area:      3.46 cm  LV Volumes (MOD) LV vol d, MOD A2C: 48.3 ml LV vol d, MOD A4C: 86.2 ml LV vol s, MOD A2C: 28.4 ml LV vol s, MOD A4C: 49.1 ml LV SV MOD A2C:     19.9 ml LV SV MOD A4C:     86.2 ml LV SV MOD BP:      29.0 ml RIGHT VENTRICLE             IVC RV Basal diam:  2.20 cm     IVC diam: 1.30 cm RV S prime:     15.60 cm/s TAPSE (M-mode): 1.5 cm LEFT ATRIUM           Index        RIGHT ATRIUM          Index LA diam:      3.80 cm 2.10 cm/m   RA Area:     7.32 cm LA Vol (A2C): 17.6 ml 9.71 ml/m   RA Volume:   12.30 ml 6.79 ml/m LA Vol (A4C): 29.6 ml 16.34 ml/m  AORTIC VALVE AV Area (Vmax):    2.87 cm AV Area (Vmean):   2.76 cm AV Area (VTI):     2.69 cm AV Vmax:           128.00 cm/s AV Vmean:          89.000 cm/s AV VTI:            0.178 m AV Peak Grad:      6.6 mmHg AV Mean Grad:      4.0 mmHg LVOT Vmax:         106.00 cm/s LVOT Vmean:        71.000 cm/s LVOT VTI:          0.138 m LVOT/AV VTI ratio: 0.78  AORTA Ao Root diam: 3.20 cm Ao Asc diam:  2.80 cm TRICUSPID VALVE TR Peak grad:   32.7 mmHg TR Vmax:        286.00 cm/s  SHUNTS Systemic VTI:  0.14 m Systemic Diam: 2.10 cm Candee Furbish MD Electronically signed by Candee Furbish MD Signature Date/Time: 08/09/2021/11:45:06 AM    Final     Cardiac Studies   Echo 08/08/21:  1. Possible takotsubo or stress induced cardiomyopathy.   2. Left ventricular ejection fraction, by estimation, is 35 to 40%. The  left ventricle has moderately decreased function. The left ventricle has  no regional wall motion abnormalities. There is severe left ventricular  hypertrophy. Left ventricular  diastolic parameters are indeterminate.   3. Right ventricular systolic function is normal. The right ventricular  size is normal. There is mildly elevated pulmonary artery systolic  pressure. The estimated right ventricular systolic pressure is 82.8 mmHg.   4. The mitral valve is normal in structure. Mild mitral valve  regurgitation. No evidence of mitral stenosis.   5. Tricuspid  valve regurgitation is moderate.   6. The aortic valve is tricuspid. There is mild calcification of the  aortic valve. Aortic valve regurgitation is mild. Aortic valve sclerosis  is present, with no evidence of aortic valve stenosis.   7. The inferior vena cava is normal in size with greater than 50%  respiratory variability, suggesting right atrial pressure of 3 mmHg.   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  Cardiology consulted for possible atrial flutter and chest pain.  Assessment & Plan    # Chest pain:  EKG shows isolated ST elevation in lead V2 with no other reciprocal changes.  She had borderline elevation on prior EKGs 01/2021.  Her chest pain is atypical and pleuritic in nature occurring in the setting of her pneumonia.  She also has chest wall tenderness.  High-sensitivity troponin has been mildly elevated to 102, but has downtrended.  We will repeat a troponin.  Continue to plan for management with ischemic evaluation once more clinically stable.  She did have coronary calcification on chest CT 08/2020.  Check lipid panel.  LDL goal <70.  She is very frail, SNF resident now s/p intubation and on tube feedings.  Will await [place] discussions prior to deciding on the appropriate ischemic evaluation.   # Atrial flutter:  Initially there was concern for atrial flutter.  However she has been found to have sinus tachycardia.  Likely in the setting of her acute illness with pneumonia and septic shock.  She has hypothyroidism and is on levothyroxine.  TSH has been elevated but T3 and free T4 are within normal limits.  She is already on Coumadin for APLS.  # Recurrent strokes: Continue Coumadin.  Persistent expressive aphasia.  # Acute systolic and diastolic heart failure: LVEF this admission 35 to 40% with global hypokinesis and severe LVH.  Planning for ischemic evaluation as above.  She is euvolemic on  exam.  Continue bisoprolol and titrate GDMT as BP tolerates.      For questions or updates, please contact [place] Please consult [email or web address] for contact info under        Signed, Skeet Latch, MD  08/10/2021, 8:36 AM

## 2021-08-10 NOTE — Progress Notes (Signed)
EKG CRITICAL VALUE  ? ? ? ?12 lead EKG performed.  Critical value noted.  Collene Mares, RN notified. ? ? ?Dionne Bucy Hebe Merriwether, CCT ?08/10/2021 8:46 AM   ?

## 2021-08-10 NOTE — Progress Notes (Signed)
Safety set changed. ?

## 2021-08-10 NOTE — Progress Notes (Addendum)
ANTICOAGULATION CONSULT NOTE - Follow Up Consult ? ?Pharmacy Consult for Enoxaparin/Warfarin ?Indication: history of pulmonary embolus ? ?Allergies  ?Allergen Reactions  ? Oxycodone-Acetaminophen Shortness Of Breath and Nausea Only  ? Propoxyphene Nausea Only and Shortness Of Breath  ?  States takes tylenol at home  ? Sulfonamide Derivatives Shortness Of Breath and Nausea Only  ? Codeine Nausea Only  ? Hydrocodone-Acetaminophen   ?  unknown  ? Hydromorphone   ?  On MAR  ? Other Other (See Comments)  ?  No blood, Jehovaeh Witness   ? Sulfamethoxazole   ? Tape   ?  Redness**PAPER TAPE OK**  ? Gabapentin Anxiety  ?  twitching  ? Latex Rash  ? Metoprolol Rash  ? Morphine And Related Rash  ?  IV site on arm is red, patient reports this is improving.  NO shortness of breath reported. Patient is given dose of Benadryl when taking Morphine   ? Rosiglitazone Rash  ? ? ?Patient Measurements: ?Height: 5' (152.4 cm) ?Weight: 85.8 kg (189 lb 2.5 oz) ?IBW/kg (Calculated) : 45.5 ?Heparin Dosing Weight: 63.7 kg ? ?Vital Signs: ?Temp: 99.3 ?F (37.4 ?C) (03/06 1145) ?Temp Source: Oral (03/06 1145) ?BP: 122/70 (03/06 1145) ?Pulse Rate: 111 (03/06 1145) ? ?Labs: ?Recent Labs  ?  08/08/21 ?0420 08/09/21 ?1443 08/09/21 ?0206 08/09/21 ?0206 08/09/21 ?1240 08/10/21 ?1540 08/10/21 ?0867 08/10/21 ?1040  ?HGB 8.5* 5.3* 8.2*  --   --   --   --   --   ?HCT 26.2* 16.6* 25.3*  --   --   --   --   --   ?PLT 217 134* 198  --   --   --   --   --   ?LABPROT 27.3* 31.9* 28.0*  --   --  23.8*  --   --   ?INR 2.5* 3.1* 2.6*  --   --  2.1*  --   --   ?CREATININE 1.00  --  0.98  --   --  0.91  --   --   ?TROPONINIHS  --   --  102*   < > 78*  --  56* 47*  ? < > = values in this interval not displayed.  ? ? ?Estimated Creatinine Clearance: 63.9 mL/min (by C-G formula based on SCr of 0.91 mg/dL). ? ? ? ?Assessment: ?61 yo female on warfarin PTA with 3.'5mg'$  and '4mg'$  alternating for hx of PE. INR supratherapeutic at 5.3 on admission and very labile with IV  vitamin K doses. Patient is very malnourished. Labile INR likely due to severe vitamin K deficiency so started on 100 mcg vitamin K PO daily 3/4. Pharmacy consulted for warfarin.  ? ?INR down to 2.1 on day 3 of 100 mcg vitamin K and warfarin 0.'5mg'$  x2. ? ?Goal of Therapy:  ?INR 2-3 ?Monitor platelets by anticoagulation protocol: Yes ?  ?Plan:  ?Warfarin 1.'5mg'$  x1   ?Vitamin K 100 mcg po qday ?Monitor daily INR ?Monitor for signs/symptoms of bleeding  ? ?Benetta Spar, PharmD, BCPS, BCCP ?Clinical Pharmacist ? ?Please check AMION for all [place] phone numbers ?After 10:00 PM, call [place] [phone] ? ?

## 2021-08-10 NOTE — Progress Notes (Signed)
Complained of chest pain pointing to mid chest and when asked about  the scale #  it is 10. BP 114/67, MD aware. Stat ekg done result seen by MD. Card PA made aware and card MD came to see pt. Stat troponin done. Nitro 1 Sl given with relief after few min. Continue to monitor. ?

## 2021-08-11 ENCOUNTER — Inpatient Hospital Stay (HOSPITAL_COMMUNITY): Payer: Medicare Other

## 2021-08-11 DIAGNOSIS — I5041 Acute combined systolic (congestive) and diastolic (congestive) heart failure: Secondary | ICD-10-CM | POA: Diagnosis not present

## 2021-08-11 DIAGNOSIS — J9601 Acute respiratory failure with hypoxia: Secondary | ICD-10-CM | POA: Diagnosis not present

## 2021-08-11 DIAGNOSIS — I10 Essential (primary) hypertension: Secondary | ICD-10-CM | POA: Diagnosis not present

## 2021-08-11 DIAGNOSIS — I248 Other forms of acute ischemic heart disease: Secondary | ICD-10-CM | POA: Diagnosis not present

## 2021-08-11 DIAGNOSIS — N1831 Chronic kidney disease, stage 3a: Secondary | ICD-10-CM | POA: Diagnosis not present

## 2021-08-11 LAB — BASIC METABOLIC PANEL
Anion gap: 7 (ref 5–15)
BUN: 35 mg/dL — ABNORMAL HIGH (ref 6–20)
CO2: 30 mmol/L (ref 22–32)
Calcium: 8.2 mg/dL — ABNORMAL LOW (ref 8.9–10.3)
Chloride: 103 mmol/L (ref 98–111)
Creatinine, Ser: 0.85 mg/dL (ref 0.44–1.00)
GFR, Estimated: >60 mL/min (ref >60)
Glucose, Bld: 238 mg/dL — ABNORMAL HIGH (ref 70–99)
Potassium: 4.1 mmol/L (ref 3.5–5.1)
Sodium: 140 mmol/L (ref 135–145)

## 2021-08-11 LAB — GLUCOSE, CAPILLARY
Glucose-Capillary: 164 mg/dL — ABNORMAL HIGH (ref 70–99)
Glucose-Capillary: 194 mg/dL — ABNORMAL HIGH (ref 70–99)
Glucose-Capillary: 204 mg/dL — ABNORMAL HIGH (ref 70–99)
Glucose-Capillary: 246 mg/dL — ABNORMAL HIGH (ref 70–99)
Glucose-Capillary: 253 mg/dL — ABNORMAL HIGH (ref 70–99)
Glucose-Capillary: 342 mg/dL — ABNORMAL HIGH (ref 70–99)

## 2021-08-11 LAB — PROTIME-INR
INR: 1.8 — ABNORMAL HIGH (ref 0.8–1.2)
Prothrombin Time: 21.1 seconds — ABNORMAL HIGH (ref 11.4–15.2)

## 2021-08-11 LAB — MAGNESIUM: Magnesium: 2.3 mg/dL (ref 1.7–2.4)

## 2021-08-11 MED ORDER — ROSUVASTATIN CALCIUM 5 MG PO TABS
10.0000 mg | ORAL_TABLET | Freq: Every day | ORAL | Status: DC
  Administered 2021-08-11 – 2021-08-12 (×2): 10 mg
  Filled 2021-08-11 (×2): qty 2

## 2021-08-11 MED ORDER — ENOXAPARIN SODIUM 100 MG/ML IJ SOSY
90.0000 mg | PREFILLED_SYRINGE | Freq: Two times a day (BID) | INTRAMUSCULAR | Status: DC
  Administered 2021-08-11 – 2021-08-13 (×5): 90 mg via SUBCUTANEOUS
  Filled 2021-08-11 (×7): qty 0.9

## 2021-08-11 MED ORDER — WARFARIN SODIUM 4 MG PO TABS
4.0000 mg | ORAL_TABLET | ORAL | Status: AC
  Administered 2021-08-11: 4 mg
  Filled 2021-08-11: qty 1

## 2021-08-11 MED ORDER — FENTANYL CITRATE PF 50 MCG/ML IJ SOSY
50.0000 ug | PREFILLED_SYRINGE | INTRAMUSCULAR | Status: DC | PRN
  Administered 2021-08-11 – 2021-08-14 (×13): 50 ug via INTRAVENOUS
  Filled 2021-08-11 (×13): qty 1

## 2021-08-11 MED ORDER — WARFARIN SODIUM 3 MG PO TABS: 3.0000 mg | ORAL_TABLET | ORAL | Status: DC

## 2021-08-11 MED FILL — Phytonadione Tab 100 MCG: ORAL | Qty: 1 | Status: AC

## 2021-08-11 NOTE — Progress Notes (Signed)
Unit secretary placed  an order for flexiseal bag but was told that nothing is available today and it could be tom. Called other units but got nothing to spare. ?

## 2021-08-11 NOTE — Progress Notes (Signed)
Speech Language Pathology Treatment: Dysphagia  ?Patient Details ?Name: Connie Ruiz ?MRN: [id number] ?DOB: 1960-11-29 ?Today's Date: 08/11/2021 ?Time: 1420-1450 ?SLP Time Calculation (min) (ACUTE ONLY): 30 min ? ?Assessment / Plan / Recommendation ?Clinical Impression ? Returned to room after MBS - pt's daughter, Connie Ruiz, was present.  MBS video was retrieved from computer in room and images from study were reviewed with her. We discussed deterioration in swallow function from her mother's baseline; current deficits in initiating movement for chewing; aspiration of thin liquids.  We discussed how difficult it may be to consume enough POs to meet her needs, despite her best efforts.  I showed Connie Ruiz how to thicken liquids to nectar - the pt drank a few sips then declined further.  I offered her some graham crackers - she put forth visible effort to chew solids, but was unable to effectively masticate, c/w performance on MBS. ? ?Connie Ruiz agreed to try dysphagia 1/nectar thick liquids for the short term.  We talked about the NG tube being a short-term solution, and that it would need to be removed prior to D/C. We talked about G-tubes. Connie Ruiz said to her daughter, slowly but with clarity ,"I don't want it" in reference to a G-tube. Connie Ruiz asked that she try to eat what is sent to her on the trays. ? ?Start dysphagia 1, nectar thick liquids. Consider removing cortrak to maximize comfort.  Will defer that decision to Connie Ruiz.  ? ?  ?HPI HPI: Patient is a 61 y.o. 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 She was admitted on 2/26 with fever, hypoxia and found to have aspiration PNA. Intubated 2/26-28. Dx acute hypoxic resp failure likely due to aspiration pna, septic shock (resolved), deconditioning. Pt has had cortrak, has been NPO. ?  ?   ?SLP Plan ? Continue with current plan of care ? ?  ?   ?Recommendations for follow up therapy are one component of a multi-disciplinary discharge planning process, led by the attending physician.  Recommendations may be updated based on patient status, additional functional criteria and insurance authorization. ?  ? ?Recommendations  ?Diet recommendations: Dysphagia 1 (puree);Nectar-thick liquid ?Liquids provided via: Cup;Straw ?Medication Administration: Crushed with puree ?Supervision: Staff to assist with self feeding ?Compensations: Minimize environmental distractions  ?   ?    ?   ? ? ? ? Oral Care Recommendations: Oral care BID ?Follow Up Recommendations: Skilled nursing-short term rehab (<3 hours/day) ?Assistance recommended at discharge: Frequent or constant Supervision/Assistance ?SLP Visit Diagnosis: Dysphagia, oral phase (R13.11) ?Plan: Continue with current plan of care ? ? ? ? ?  ?  ?Connie Ruiz L. Connie Casas, MA CCC/SLP ?Acute Rehabilitation Services ?Office number [phone] ?Pager [phone] ? ? ?Connie Ruiz ? ?08/11/2021, 3:03 PM ?

## 2021-08-11 NOTE — Progress Notes (Signed)
PROGRESS NOTE        PATIENT DETAILS Name: Connie Ruiz Age: 61 y.o. Sex: female Date of Birth: 19-Aug-1960 Admit Date: 08/02/2021 Admitting Physician Chesley Mires, MD EVO:[id number], Malka So, MD  Brief Summary: Patient is a 61 y.o.  female with history of kidney transplant, VTE/antiphospholipid syndrome on chronic Coumadin therapy, CVA with residual aphasia-slight right-sided deficits-resident of SNF who presented to the hospital with acute respiratory distress and fever, she was intubated in the emergency room-and managed in the ICU.  She was subsequently stabilized-extubated on 2/28-unfortunately-she required insertion of NG tube for feedings.  Her hypoxia gradually improved-and patient was transferred to [facility] service on 3/3.  Further hospital course was complicated by development of  volume overload-she was found to have new onset systolic heart failure.  See below for further details.  Significant Hospital events: 2/26>> admit to ICU-intubated. 2/27>> On vent, remains on Levophed 20mg, Vanc, Cefepime  2/28>> Extubated 3/03>> [facility]    Significant imaging studies: 2/26>> CXR: RLL infiltrate. 2/26>> CT head: No acute intracranial findings, chronic left MCA territory infarct. 3/01>> CT head: No acute intracranial abnormalities. 3/01>> RUQ ultrasound: Subtle nodularity of the liver-nonspecific-could reflect early cirrhosis. 3/05>> Echo: EF 35-40%, RVSP 40.7 mmHg.  Significant microbiology data: 2/26>> COVID/influenza PCR: Negative 2/26>> blood culture: Negative 2/27>> urine culture: Negative  Procedures: ETT>>2/26-2/28  Consults:  CCM, nephrology  Subjective: Some chest pain again overnight.  For MBS later this afternoon.  Objective: Vitals: Blood pressure 132/78, pulse (!) 115, temperature 98.7 F (37.1 C), temperature source Oral, resp. rate 19, height 5' (1.524 m), weight 86.6 kg, SpO2 94 %.   Exam: Gen Exam:Alert awake-not in any  distress HEENT:atraumatic, normocephalic Chest: Scattered wheezing continues. CVS:S1S2 regular Abdomen:soft non tender, non distended Extremities:++ edema Neurology: Non focal Skin: no rash   Pertinent Labs/Radiology: CBC Latest Ref Rng & Units 08/09/2021 08/09/2021 08/08/2021  WBC 4.0 - 10.5 K/uL 14.2(H) 9.3 12.3(H)  Hemoglobin 12.0 - 15.0 g/dL 8.2(L) 5.3(LL) 8.5(L)  Hematocrit 36.0 - 46.0 % 25.3(L) 16.6(L) 26.2(L)  Platelets 150 - 400 K/uL 198 134(L) 217    Lab Results  Component Value Date   NA 140 08/11/2021   K 4.1 08/11/2021   CL 103 08/11/2021   CO2 30 08/11/2021      Assessment/Plan: Acute hypoxic respiratory failure likely due to aspiration pneumonia: Hypoxia has improved-completed 7 days of Zosyn.  Cultures negative so far.  Unfortunately-with prior history of CVA/dysarthria/severe debility/deconditioning-remains at risk for aspiration.  Continue to maintain aspiration precautions-keep head end up at 60-90 degree angle, continue Robinul, encourage incentive spirometry/flutter valve.    Septic shock (present on admission): Sepsis physiology resolved-cultures negative so far-completed antibiotic course..Earnestine LeysCKD stage IIIa-history of renal transplant: AKI likely hemodynamically mediated-renal function has improved.  No longer on stress dose steroids-on chronic dosing of prednisone, on tacrolimus.  Neurology has signed off.  Follow renal function closely as patient is on IV Lasix.    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  Remains on IV Lasix with stable renal function.  Continue bisoprolol.  Unclear whether this is Takotsubo cardiomyopathy or ischemic etiology.  Cardiology following-with tentative plans to pursue ischemic evaluation if/when she is more stable.    Sinus tachycardia: Remains stable-with  heart rate in the low 100s  range-on bisoprolol.   Likely provoked by acute illness/PNA/physiological stress.    Prolonged QTc: Likely due to hypomagnesemia-QTc much better after replacing magnesium.  Continue telemetry monitoring-avoid QTc prolonging agents.  Atypical chest pain: Continues to have chest pain for the past 2-3 days-on PPI twice daily-troponins negative.  Cardiology following.  Dysphagia: Likely chronic-worsened due to severe debility from acute illness-cortak tube in place-feedings ongoing.  SLP planning modified barium swallow today-we will await further recommendations.  If she continues to have persistent dysphagia-we will need to consider PEG tube placement.  Daughter/patient aware that placing a feeding tube would not decrease the risk of aspiration.  Hypokalemia/hypophosphatemia: Repleted.  Normocytic anemia: Likely due to critical illness-no evidence of blood loss-follow closely-patient is a [name].  DM-2 (A1c 6.0 on 05/27/2021) with steroid-induced hyperglycemia: Continue SSI-steroids being tapered down-CBGs should improve.  Recent Labs    08/11/21 0350 08/11/21 0738 08/11/21 1117  GLUCAP 204* 194* 253*      HTN: BP stable-continue bisoprolol.  Coreg discontinued due to bronchospasm.  History of PE-antiphospholipid syndrome: INR slightly subtherapeutic-pharmacy following-on Coumadin-with plans to restart Lovenox.    History of multiple CVA's with chronic aphasia/right-sided weakness: At baseline-CT head x2 negative for acute abnormalities.  Resident of SNF.  Vascular dementia: We will resume Aricept, Prozac, mirtazapine and amitriptyline over the next few days.  Hypothyroidism: On levothyroxine.  TSH mildly elevated-further optimization of levothyroxine dosing can be done by PCP in the outpatient setting, unclear to me when it was last adjusted.  Refusal of blood transfusions as patient is [name]  Debility/Deconditioning: Chronically frail and weak-Per  daughter-patient has been very minimally ambulatory for the past 1 year and is mostly bed to wheelchair bound.  Her last CVA was 8 years ago.    Palliative care: Full code-difficult situation-frail/bedbound-now with significant debility/deconditioning-and severe dysphagia-continues to accumulate secretions.  Long discussion with daughter on 3/4-she is aware of tenuous clinical situation-palliative care following.  Nutrition Status: Nutrition Problem: Moderate Malnutrition Etiology: chronic illness (CHF, CVA) Signs/Symptoms: mild fat depletion, moderate muscle depletion, percent weight loss (16% weight loss in 6 months) Percent weight loss: 16 % (6 months) Interventions: Tube feeding, Prostat   BMI: Estimated body mass index is 37.29 kg/m as calculated from the following:   Height as of this encounter: 5' (1.524 m).   Weight as of this encounter: 86.6 kg.   Code status:   Code Status: Full Code   DVT Prophylaxis: Place and maintain sequential compression device Start: 08/02/21 1242 SCDs Start: 08/02/21 1209    Family Communication: Daughter Lanitra Battaglini  [phone]-YC 3/7   Disposition Plan: Status is: Inpatient Remains inpatient appropriate because: Resolving septic shock due to aspiration pneumonia-remains n.p.o. due to severe dysphagia.  On NG tube feedings.    Planned Discharge Destination:Skilled nursing facility   Diet: Diet Order             Diet NPO time specified  Diet effective now                     Antimicrobial agents: Anti-infectives (From admission, onward)    Start     Dose/Rate Route Frequency Ordered Stop   08/03/21 1500  vancomycin (VANCOREADY) IVPB 750 mg/150 mL  Status:  Discontinued        750 mg 150 mL/hr over 60 Minutes Intravenous Every 24 hours 08/02/21 1456 08/03/21 0912   08/03/21 0630  cefTRIAXone (ROCEPHIN) 2 g in sodium chloride 0.9 %  100 mL IVPB  Status:  Discontinued        2 g 200 mL/hr over 30 Minutes Intravenous Every  24 hours 08/02/21 1209 08/02/21 1246   08/02/21 2100  piperacillin-tazobactam (ZOSYN) IVPB 3.375 g       See Hyperspace for full Linked Orders Report.   3.375 g 12.5 mL/hr over 240 Minutes Intravenous Every 8 hours 08/02/21 1253 08/08/21 2359   08/02/21 1345  vancomycin (VANCOREADY) IVPB 1750 mg/350 mL        1,750 mg 175 mL/hr over 120 Minutes Intravenous  Once 08/02/21 1343 08/02/21 1721   08/02/21 1300  vancomycin (VANCOREADY) IVPB 2000 mg/400 mL  Status:  Discontinued        2,000 mg 200 mL/hr over 120 Minutes Intravenous  Once 08/02/21 1253 08/02/21 1343   08/02/21 1300  piperacillin-tazobactam (ZOSYN) IVPB 3.375 g       See Hyperspace for full Linked Orders Report.   3.375 g 100 mL/hr over 30 Minutes Intravenous  Once 08/02/21 1253 08/02/21 1555   08/02/21 0630  cefTRIAXone (ROCEPHIN) 2 g in sodium chloride 0.9 % 100 mL IVPB        2 g 200 mL/hr over 30 Minutes Intravenous  Once 08/02/21 0618 08/02/21 0807   08/02/21 0630  azithromycin (ZITHROMAX) 500 mg in sodium chloride 0.9 % 250 mL IVPB  Status:  Discontinued        500 mg 250 mL/hr over 60 Minutes Intravenous Every 24 hours 08/02/21 0618 08/02/21 1246        MEDICATIONS: Scheduled Meds:  bisoprolol  5 mg Per Tube Daily   chlorhexidine  15 mL Mouth Rinse BID   Chlorhexidine Gluconate Cloth  6 each Topical Daily   enoxaparin (LOVENOX) injection  90 mg Subcutaneous Q12H   feeding supplement (PROSource TF)  45 mL Per Tube BID   furosemide  40 mg Intravenous BID   glycopyrrolate  0.1 mg Intravenous TID   insulin aspart  0-9 Units Subcutaneous Q4H   levalbuterol  0.63 mg Nebulization TID   And   ipratropium  0.5 mg Nebulization TID   levothyroxine  150 mcg Per Tube Q0600   mouth rinse  15 mL Mouth Rinse q12n4p   pantoprazole sodium  40 mg Per Tube BID   Phytonadione  100 mcg Per Tube QPM   predniSONE  5 mg Per Tube Q breakfast   rosuvastatin  10 mg Per Tube Daily   sodium chloride flush  3 mL Intravenous Q12H    tacrolimus  1 mg Sublingual BID   Warfarin - Pharmacist Dosing Inpatient   Does not apply q1600   Continuous Infusions:  sodium chloride Stopped (08/06/21 1000)   feeding supplement (OSMOLITE 1.5 CAL) 50 mL/hr at 08/11/21 1000   PRN Meds:.acetaminophen **OR** acetaminophen, albuterol, docusate, fentaNYL (SUBLIMAZE) injection, lidocaine (PF), nitroGLYCERIN, polyethylene glycol, prochlorperazine   I have personally reviewed following labs and imaging studies  LABORATORY DATA: CBC: Recent Labs  Lab 08/04/21 1425 08/05/21 0341 08/06/21 0318 08/07/21 0354 08/08/21 0420 08/09/21 0046 08/09/21 0206  WBC 24.5*   < > 16.3* 10.9* 12.3* 9.3 14.2*  NEUTROABS 22.6*  --   --   --   --   --   --   HGB 8.3*   < > 8.0* 8.8* 8.5* 5.3* 8.2*  HCT 25.8*   < > 24.5* 26.6* 26.2* 16.6* 25.3*  MCV 89.0   < > 87.5 87.8 87.9 91.2 88.5  PLT 188   < > 163 170  217 134* 198   < > = values in this interval not displayed.     Basic Metabolic Panel: Recent Labs  Lab 08/07/21 0354 08/07/21 1620 08/08/21 0420 08/09/21 0206 08/10/21 0433 08/11/21 0350  NA 142  --  143 142 141 140  K 2.8*  --  3.8 3.8 4.0 4.1  CL 112*  --  110 110 107 103  CO2 22  --  '22 25 28 30  '$ GLUCOSE 283*  --  279* 266* 219* 238*  BUN 29*  --  28* 26* 27* 35*  CREATININE 1.07*  --  1.00 0.98 0.91 0.85  CALCIUM 8.2*  --  8.2* 8.0* 8.1* 8.2*  MG 2.1 2.0 2.0 1.7 1.6* 2.3  PHOS 1.9* 2.9 2.4* 4.0 4.1  --      GFR: Estimated Creatinine Clearance: 68.8 mL/min (by C-G formula based on SCr of 0.85 mg/dL).  Liver Function Tests: Recent Labs  Lab 08/05/21 0341 08/09/21 0206  AST 12* 10*  ALT 11 9  ALKPHOS 34* 35*  BILITOT 0.6 0.4  PROT 4.9* 4.8*  ALBUMIN 2.1* 1.8*    No results for input(s): LIPASE, AMYLASE in the last 168 hours. No results for input(s): AMMONIA in the last 168 hours.  Coagulation Profile: Recent Labs  Lab 08/08/21 0420 08/09/21 0046 08/09/21 0206 08/10/21 0433 08/11/21 0350  INR 2.5* 3.1* 2.6*  2.1* 1.8*     Cardiac Enzymes: No results for input(s): CKTOTAL, CKMB, CKMBINDEX, TROPONINI in the last 168 hours.  BNP (last 3 results) No results for input(s): PROBNP in the last 8760 hours.  Lipid Profile: Recent Labs    08/10/21 0433  CHOL 143  HDL 37*  LDLCALC 72  TRIG 169*  CHOLHDL 3.9     Thyroid Function Tests: Recent Labs    08/08/21 1832  TSH 8.378*  FREET4 1.04     Anemia Panel: No results for input(s): VITAMINB12, FOLATE, FERRITIN, TIBC, IRON, RETICCTPCT in the last 72 hours.  Urine analysis:    Component Value Date/Time   COLORURINE YELLOW 08/03/2021 0032   APPEARANCEUR CLOUDY (A) 08/03/2021 0032   LABSPEC 1.029 08/03/2021 0032   PHURINE 5.0 08/03/2021 0032   GLUCOSEU NEGATIVE 08/03/2021 0032   GLUCOSEU NEGATIVE 10/22/2013 1621   HGBUR NEGATIVE 08/03/2021 0032   BILIRUBINUR NEGATIVE 08/03/2021 0032   KETONESUR 5 (A) 08/03/2021 0032   PROTEINUR 30 (A) 08/03/2021 0032   UROBILINOGEN 1.0 03/27/2015 1534   NITRITE NEGATIVE 08/03/2021 0032   LEUKOCYTESUR TRACE (A) 08/03/2021 0032    Sepsis Labs: Lactic Acid, Venous    Component Value Date/Time   LATICACIDVEN 0.8 08/02/2021 0511    MICROBIOLOGY: Recent Results (from the past 240 hour(s))  Blood Culture (routine x 2)     Status: None   Collection Time: 08/02/21  6:04 AM   Specimen: BLOOD LEFT ARM  Result Value Ref Range Status   Specimen Description BLOOD LEFT ARM  Final   Special Requests   Final    BOTTLES DRAWN AEROBIC AND ANAEROBIC Blood Culture adequate volume   Culture   Final    NO GROWTH 5 DAYS Performed at [facility], [address], [place], [place] [address]    Report Status 08/07/2021 FINAL  Final  Blood Culture (routine x 2)     Status: None   Collection Time: 08/02/21  6:05 AM   Specimen: BLOOD LEFT HAND  Result Value Ref Range Status   Specimen Description BLOOD LEFT HAND  Final   Special  Requests AEROBIC BOTTLE ONLY Blood Culture adequate volume  Final    Culture   Final    NO GROWTH 5 DAYS Performed at [facility], [place] [address], [place], [place] [address]    Report Status 08/07/2021 FINAL  Final  MRSA Next Gen by PCR, Nasal     Status: None   Collection Time: 08/02/21 12:40 PM   Specimen: Nasal Mucosa; Nasal Swab  Result Value Ref Range Status   MRSA by PCR Next Gen NOT DETECTED NOT DETECTED Final    Comment: (NOTE) The GeneXpert MRSA Assay (FDA approved for NASAL specimens only), is one component of a comprehensive MRSA colonization surveillance program. It is not intended to diagnose MRSA infection nor to guide or monitor treatment for MRSA infections. Test performance is not FDA approved in patients less than 52 years old. Performed at [facility], [place] [address], [place], [place] [address]   Resp Panel by RT-PCR (Flu A&B, Covid) Nasopharyngeal Swab     Status: None   Collection Time: 08/02/21 12:51 PM   Specimen: Nasopharyngeal Swab; Nasopharyngeal(NP) swabs in vial transport medium  Result Value Ref Range Status   SARS Coronavirus 2 by RT PCR NEGATIVE NEGATIVE Final    Comment: (NOTE) SARS-CoV-2 target nucleic acids are NOT DETECTED.  The SARS-CoV-2 RNA is generally detectable in upper respiratory specimens during the acute phase of infection. The lowest concentration of SARS-CoV-2 viral copies this assay can detect is 138 copies/mL. A negative result does not preclude SARS-Cov-2 infection and should not be used as the sole basis for treatment or other patient management decisions. A negative result may occur with  improper specimen collection/handling, submission of specimen other than nasopharyngeal swab, presence of viral mutation(s) within the areas targeted by this assay, and inadequate number of viral copies(<138 copies/mL). A negative result must be combined with clinical observations, patient history, and epidemiological information. The expected result is Negative.  Fact Sheet for Patients:   EntrepreneurPulse.com.au  Fact Sheet for Healthcare Providers:  IncredibleEmployment.be  This test is no t yet approved or cleared by the [place] FDA and  has been authorized for detection and/or diagnosis of SARS-CoV-2 by FDA under an Emergency Use Authorization (EUA). This EUA will remain  in effect (meaning this test can be used) for the duration of the COVID-19 declaration under Section 564(b)(1) of the Act, 21 U.S.C.section 360bbb-3(b)(1), unless the authorization is terminated  or revoked sooner.       Influenza A by PCR NEGATIVE NEGATIVE Final   Influenza B by PCR NEGATIVE NEGATIVE Final    Comment: (NOTE) The Xpert Xpress SARS-CoV-2/FLU/RSV plus assay is intended as an aid in the diagnosis of influenza from Nasopharyngeal swab specimens and should not be used as a sole basis for treatment. Nasal washings and aspirates are unacceptable for Xpert Xpress SARS-CoV-2/FLU/RSV testing.  Fact Sheet for Patients: EntrepreneurPulse.com.au  Fact Sheet for Healthcare Providers: IncredibleEmployment.be  This test is not yet approved or cleared by the [place] FDA and has been authorized for detection and/or diagnosis of SARS-CoV-2 by FDA under an Emergency Use Authorization (EUA). This EUA will remain in effect (meaning this test can be used) for the duration of the COVID-19 declaration under Section 564(b)(1) of the Act, 21 U.S.C. section 360bbb-3(b)(1), unless the authorization is terminated or revoked.  Performed at [facility], [place] [address], [place], [place] [address]   Urine Culture     Status: None   Collection Time: 08/03/21 12:21  AM   Specimen: In/Out Cath Urine  Result Value Ref Range Status   Specimen Description IN/OUT CATH URINE  Final   Special Requests NONE  Final   Culture   Final    NO GROWTH Performed at [facility], [address] [place], [place]  [address]    Report Status 08/04/2021 FINAL  Final    RADIOLOGY STUDIES/RESULTS: No results found.   LOS: 9 days   Oren Binet, MD  [facility]    To contact the attending provider between 7A-7P or the covering provider during after hours 7P-7A, please log into the web site [email or web address] and access using universal [place] password for that web site. If you do not have the password, please call the hospital operator.  08/11/2021, 11:57 AM

## 2021-08-11 NOTE — Progress Notes (Addendum)
Modified Barium Swallow Progress Note ? ?Patient Details  ?Name: Connie Ruiz ?MRN: [id number] ?Date of Birth: [birth year] ? ?Today's Date: 08/11/2021 ? ?Modified Barium Swallow completed.  Full report located under Chart Review in the Imaging Section. ? ?Brief recommendations include the following: ? ?Clinical Impression ? Pt presents with a mod-severe oral and mild pharyngeal dysphagia. Oral phase is marked by significant difficulty with initiation of motor sequence for swallowing.  There are subtle movements and tremors noted in tongue - significant time passes before the tongue can propel material into the pharynx. When the swallow was triggered,  thin liquids were aspirated in trace amounts; nectar thick liquids did not penetrate the larynx/ no aspiration.  There was no pharyngeal residue post-swallow.  Recommend initiating a dysphagia 1 diet, nectar thick liquids for now; crush meds in puree. ? ? ?  ?Swallow Evaluation Recommendations ? ?   ? ? SLP Diet Recommendations: Thin liquid;Dysphagia 1 (Puree) solids ? ? Liquid Administration via: Cup;Straw ? ? Medication Administration: Crushed with puree ? ? Supervision: Staff to assist with self feeding ? ? Compensations: Minimize environmental distractions ? ?   ? ? Oral Care Recommendations: Oral care BID ? ? Other Recommendations: Order thickener from pharmacy ? ? ?Aviella Disbrow L. Maxson Oddo, MA CCC/SLP ?Acute Rehabilitation Services ?Office number [phone] ?Pager [phone] ? ?Juan Quam Laurice ?08/11/2021,2:57 PM ?

## 2021-08-11 NOTE — Progress Notes (Signed)
Nutrition Follow-up ? ?DOCUMENTATION CODES:  ? ?Non-severe (moderate) malnutrition in context of chronic illness ? ?INTERVENTION:  ? ?- Start 48-hour calorie count today at 1100 with lunch meal, RD to follow up with day 1 results tomorrow, 08/13/21 ? ?- Encourage PO intake and provide feeding assistance as needed ? ?- Hormel Shake TID, each supplement provides 520 kcal and 22 grams of protein ? ?- Mighty Shake TID, each supplement provides 330 kcal and 9 grams of protein ? ?NUTRITION DIAGNOSIS:  ? ?Moderate Malnutrition related to chronic illness (CHF, CVA) as evidenced by mild fat depletion, moderate muscle depletion, percent weight loss (16% weight loss in 6 months). ? ?Ongoing, being addressed via diet advancement and oral nutrition supplements ? ?GOAL:  ? ?Patient will meet greater than or equal to 90% of their needs ? ?Progressing ? ?MONITOR:  ? ?Diet advancement, Labs, Weight trends, TF tolerance, I & O's ? ?REASON FOR ASSESSMENT:  ? ?Consult ?Enteral/tube feeding initiation and management ? ?ASSESSMENT:  ? ?61 year old female who presented to the ED from SNF on 2/26 with AMS. PMH of CHF, T2DM, HTN, HLD, CVA, chronic cognitive impairment (mostly nonverbal), renal transplant in 2005, SLE, PE. Pt required intubation and admitted with sepsis due to pneumonia. ? ?02/27 - extubated ?03/01 - Cortrak placed (tip gastric), TF initiated ?03/07 - s/p MBS, diet advanced to dysphagia 1 with nectar-thick liquids, Cortrak removed ? ?Discussed pt with SLP, MD, and RN. Pt's Cortrak was removed yesterday for pt's comfort after diet was advanced to dysphagia 1. Pt declined breakfast this morning. She only accepted a few sips of thickened Ensure then declined further PO. Per SLP, pt is apathetic about eating and drinking and pt indicated yesterday to SLP that she would not want a PEG tube placed. ? ?Per discussion with MD, will start 48-hour calorie count. Discussed this plan with RN. Envelope was hung on pt's door by this RD.  Calorie count to start today at lunch meal. ? ?Spoke with pt briefly at bedside. Pt was sleeping when RD entered room but awoke briefly to RD voice. Pt endorsed not having much of an appetite or interest in eating. She confirmed only eating a few bites of breakfast and drinking a few sips of Ensure. RD encouraged PO intake. Will add nectar-thick oral nutrition supplements to pt's meal trays to maximize PO intake. ? ?Admit weight: 79.6 kg ?Current weight: 86.6 kg ? ?Pt with mild pitting edema to BUE and BLE. ? ?Meal Completion: 0-10% ? ?Medications reviewed and include: IV lasix, SSI q 4 hours, IV protonix, prednisone, prograf, warfarin ? ?Labs reviewed: BUN 36, WBC 16.5, hemoglobin 7.3 ?CBG's: 94-342 x 24 hours ? ?UOP: 1050 ml x 24 hours ?Rectal tube: 300 ml x 12 hours ?I/O's: +6.5 L since admit ? ?Diet Order:   ?Diet Order   ? ?       ?  DIET - DYS 1 Room service appropriate? Yes with Assist; Fluid consistency: Nectar Thick  Diet effective now       ?  ? ?  ?  ? ?  ? ? ?EDUCATION NEEDS:  ? ?Not appropriate for education at this time ? ?Skin:  Skin Assessment: Reviewed RN Assessment (MASD to groin, skin tear to back) ? ?Last BM:  08/11/21 type 7 via rectal tube ? ?Height:  ? ?Ht Readings from Last 1 Encounters:  ?08/03/21 5' (1.524 m)  ? ? ?Weight:  ? ?Wt Readings from Last 1 Encounters:  ?08/11/21 86.6 kg  ? ? ?BMI:  Body mass index is 37.29 kg/m?. ? ?Estimated Nutritional Needs:  ? ?Kcal:  1800-2000 ? ?Protein:  90-105 grams ? ?Fluid:  1.8 L/day ? ? ? ?Gustavus Bryant, MS, RD, LDN ?Inpatient Clinical Dietitian ?Please see AMiON for contact information. ? ?

## 2021-08-11 NOTE — Progress Notes (Signed)
Incontinent of stool  watery , brown in coloe in mod amount. Noted with flexiseal came out. Cleansed and flexiseal reinserted. ?

## 2021-08-11 NOTE — Progress Notes (Signed)
Tube feeding stopped and cortrak removed , tolerated well. Apple juice  with nectar thickener given slowly , tolerated well. ?

## 2021-08-11 NOTE — Progress Notes (Signed)
Progress Note  Patient Name: Connie Ruiz Date of Encounter: 08/11/2021  [facility] Cardiologist: Quay Burow, MD   Subjective   Expressive aphasia.  She endorses constant chest pain that is pleuritic.  Also worse with palpation of her chest.  Inpatient Medications    Scheduled Meds:  bisoprolol  5 mg Per Tube Daily   chlorhexidine  15 mL Mouth Rinse BID   Chlorhexidine Gluconate Cloth  6 each Topical Daily   feeding supplement (PROSource TF)  45 mL Per Tube BID   furosemide  40 mg Intravenous BID   glycopyrrolate  0.1 mg Intravenous TID   insulin aspart  0-9 Units Subcutaneous Q4H   levalbuterol  0.63 mg Nebulization TID   And   ipratropium  0.5 mg Nebulization TID   levothyroxine  150 mcg Per Tube Q0600   mouth rinse  15 mL Mouth Rinse q12n4p   pantoprazole sodium  40 mg Per Tube BID   Phytonadione  100 mcg Per Tube QPM   predniSONE  5 mg Per Tube Q breakfast   sodium chloride flush  3 mL Intravenous Q12H   tacrolimus  1 mg Sublingual BID   Warfarin - Pharmacist Dosing Inpatient   Does not apply q1600   Continuous Infusions:  sodium chloride Stopped (08/06/21 1000)   feeding supplement (OSMOLITE 1.5 CAL) 1,000 mL (08/11/21 0318)   PRN Meds: acetaminophen **OR** acetaminophen, albuterol, docusate, fentaNYL (SUBLIMAZE) injection, lidocaine (PF), nitroGLYCERIN, polyethylene glycol, prochlorperazine   Vital Signs    Vitals:   08/11/21 0326 08/11/21 0527 08/11/21 0700 08/11/21 0742  BP: 107/77  113/77   Pulse: (!) 114  (!) 114 (!) 114  Resp: '12 12 17 14  '$ Temp: 98.8 F (37.1 C)  98.7 F (37.1 C)   TempSrc: Oral  Oral   SpO2: 96% 97% 97% 100%  Weight: 86.6 kg     Height:        Intake/Output Summary (Last 24 hours) at 08/11/2021 0917 Last data filed at 08/11/2021 0500 Gross per 24 hour  Intake 1100 ml  Output 600 ml  Net 500 ml   Last 3 Weights 08/11/2021 08/10/2021 08/09/2021  Weight (lbs) 190 lb 14.7 oz 189 lb 2.5 oz 186 lb 8.2 oz  Weight (kg) 86.6 kg 85.8  kg 84.6 kg      Telemetry    Sinus tachycardia- Personally Reviewed  ECG    Sinus tachycardia.  Rate 119 bpm.  ST elevation V1 through V3, most prominent in V2.  Anterolateral T wave inversion. - Personally Reviewed  Physical Exam   VS:  BP 113/77    Pulse (!) 114    Temp 98.7 F (37.1 C) (Oral)    Resp 14    Ht 5' (1.524 m)    Wt 86.6 kg    SpO2 100%    BMI 37.29 kg/m  , BMI Body mass index is 37.29 kg/m. GENERAL: Chronically ill-appearing. HEENT: Bilateral proptosis.  Pupils equal round and reactive, fundi not visualized, oral mucosa unremarkable NECK:  No jugular venous distention, waveform within normal limits, carotid upstroke brisk and symmetric, no bruits, no thyromegaly LUNGS: Coarse breath sounds and rhonchi HEART:  RRR.  PMI not displaced or sustained,S1 and S2 within normal limits, no S3, no S4, no clicks, no rubs, no murmurs ABD:  Flat, positive bowel sounds normal in frequency in pitch, no bruits, no rebound, no guarding, no midline pulsatile mass, no hepatomegaly, no splenomegaly EXT:  2 plus pulses throughout, trace edema, no  cyanosis no clubbing SKIN:  No rashes no nodules NEURO:  Cranial nerves II through XII grossly intact, motor grossly intact throughout PSYCH:  Cognitively intact, oriented to person place and time  Labs    High Sensitivity Troponin:   Recent Labs  Lab 08/09/21 0206 08/09/21 1240 08/10/21 0915 08/10/21 1040  TROPONINIHS 102* 78* 56* 47*     Chemistry Recent Labs  Lab 08/05/21 0341 08/05/21 1645 08/09/21 0206 08/10/21 0433 08/11/21 0350  NA 139   < > 142 141 140  K 3.2*   < > 3.8 4.0 4.1  CL 108   < > 110 107 103  CO2 20*   < > '25 28 30  '$ GLUCOSE 115*   < > 266* 219* 238*  BUN 30*   < > 26* 27* 35*  CREATININE 1.35*   < > 0.98 0.91 0.85  CALCIUM 8.0*   < > 8.0* 8.1* 8.2*  MG 2.2   < > 1.7 1.6* 2.3  PROT 4.9*  --  4.8*  --   --   ALBUMIN 2.1*  --  1.8*  --   --   AST 12*  --  10*  --   --   ALT 11  --  9  --   --   ALKPHOS  34*  --  35*  --   --   BILITOT 0.6  --  0.4  --   --   GFRNONAA 45*   < > >60 >60 >60  ANIONGAP 11   < > '7 6 7   '$ < > = values in this interval not displayed.    Lipids  Recent Labs  Lab 08/10/21 0433  CHOL 143  TRIG 169*  HDL 37*  LDLCALC 72  CHOLHDL 3.9    Hematology Recent Labs  Lab 08/08/21 0420 08/09/21 0046 08/09/21 0206  WBC 12.3* 9.3 14.2*  RBC 2.98* 1.82* 2.86*  HGB 8.5* 5.3* 8.2*  HCT 26.2* 16.6* 25.3*  MCV 87.9 91.2 88.5  MCH 28.5 29.1 28.7  MCHC 32.4 31.9 32.4  RDW 16.8* 16.9* 16.9*  PLT 217 134* 198   Thyroid  Recent Labs  Lab 08/08/21 1832  TSH 8.378*  FREET4 1.04    BNPNo results for input(s): BNP, PROBNP in the last 168 hours.  DDimer No results for input(s): DDIMER in the last 168 hours.   Radiology    ECHOCARDIOGRAM COMPLETE  Result Date: 08/09/2021    ECHOCARDIOGRAM REPORT   Patient Name:   VIVIANNE CARLES Date of Exam: 08/09/2021 Medical Rec #:  [id number]    Height:       60.0 in Accession #:    [id number]   Weight:       186.5 lb Date of Birth:  08/04/1960    BSA:          1.812 m Patient Age:    50 years     BP:           100/57 mmHg Patient Gender: F            HR:           129 bpm. Exam Location:  Inpatient Procedure: 2D Echo, Cardiac Doppler and Color Doppler Indications:    Atrial fibrillation  History:        Patient has prior history of Echocardiogram examinations, most                 recent 02/03/2021. Signs/Symptoms:Shortness of Breath; Risk  Factors:Hypertension, Diabetes and Dyslipidemia. Hx kidney                 transplant and CVA.  Sonographer:    Clayton Lefort RDCS (AE) Referring Phys: [place]  1. Possible takotsubo or stress induced cardiomyopathy.  2. Left ventricular ejection fraction, by estimation, is 35 to 40%. The left ventricle has moderately decreased function. The left ventricle has no regional wall motion abnormalities. There is severe left ventricular hypertrophy. Left ventricular diastolic  parameters are indeterminate.  3. Right ventricular systolic function is normal. The right ventricular size is normal. There is mildly elevated pulmonary artery systolic pressure. The estimated right ventricular systolic pressure is 27.0 mmHg.  4. The mitral valve is normal in structure. Mild mitral valve regurgitation. No evidence of mitral stenosis.  5. Tricuspid valve regurgitation is moderate.  6. The aortic valve is tricuspid. There is mild calcification of the aortic valve. Aortic valve regurgitation is mild. Aortic valve sclerosis is present, with no evidence of aortic valve stenosis.  7. The inferior vena cava is normal in size with greater than 50% respiratory variability, suggesting right atrial pressure of 3 mmHg. Comparison(s): Prior images reviewed side by side. The left ventricular function is worsened. FINDINGS  Left Ventricle: Left ventricular ejection fraction, by estimation, is 35 to 40%. The left ventricle has moderately decreased function. The left ventricle has no regional wall motion abnormalities. The left ventricular internal cavity size was normal in size. There is severe left ventricular hypertrophy. Left ventricular diastolic parameters are indeterminate.  LV Wall Scoring: The mid and distal anterior wall, entire apex, mid and distal inferior wall, mid anterolateral segment, and mid inferoseptal segment are akinetic. Right Ventricle: The right ventricular size is normal. No increase in right ventricular wall thickness. Right ventricular systolic function is normal. There is mildly elevated pulmonary artery systolic pressure. The tricuspid regurgitant velocity is 2.86  m/s, and with an assumed right atrial pressure of 8 mmHg, the estimated right ventricular systolic pressure is 62.3 mmHg. Left Atrium: Left atrial size was normal in size. Right Atrium: Right atrial size was normal in size. Pericardium: There is no evidence of pericardial effusion. Mitral Valve: The mitral valve is normal in  structure. Mild mitral valve regurgitation. No evidence of mitral valve stenosis. Tricuspid Valve: The tricuspid valve is normal in structure. Tricuspid valve regurgitation is moderate . No evidence of tricuspid stenosis. Aortic Valve: The aortic valve is tricuspid. There is mild calcification of the aortic valve. Aortic valve regurgitation is mild. Aortic valve sclerosis is present, with no evidence of aortic valve stenosis. Aortic valve mean gradient measures 4.0 mmHg. Aortic valve peak gradient measures 6.6 mmHg. Aortic valve area, by VTI measures 2.69 cm. Pulmonic Valve: The pulmonic valve was normal in structure. Pulmonic valve regurgitation is not visualized. No evidence of pulmonic stenosis. Aorta: The aortic root is normal in size and structure. Venous: The inferior vena cava is normal in size with greater than 50% respiratory variability, suggesting right atrial pressure of 3 mmHg. IAS/Shunts: No atrial level shunt detected by color flow Doppler. Additional Comments: Possible takotsubo or stress induced cardiomyopathy.  LEFT VENTRICLE PLAX 2D LVIDd:         3.40 cm LVIDs:         2.50 cm LV PW:         2.00 cm LV IVS:        1.80 cm LVOT diam:     2.10 cm LV SV:  48 LV SV Index:   26 LVOT Area:     3.46 cm  LV Volumes (MOD) LV vol d, MOD A2C: 48.3 ml LV vol d, MOD A4C: 86.2 ml LV vol s, MOD A2C: 28.4 ml LV vol s, MOD A4C: 49.1 ml LV SV MOD A2C:     19.9 ml LV SV MOD A4C:     86.2 ml LV SV MOD BP:      29.0 ml RIGHT VENTRICLE             IVC RV Basal diam:  2.20 cm     IVC diam: 1.30 cm RV S prime:     15.60 cm/s TAPSE (M-mode): 1.5 cm LEFT ATRIUM           Index        RIGHT ATRIUM          Index LA diam:      3.80 cm 2.10 cm/m   RA Area:     7.32 cm LA Vol (A2C): 17.6 ml 9.71 ml/m   RA Volume:   12.30 ml 6.79 ml/m LA Vol (A4C): 29.6 ml 16.34 ml/m  AORTIC VALVE AV Area (Vmax):    2.87 cm AV Area (Vmean):   2.76 cm AV Area (VTI):     2.69 cm AV Vmax:           128.00 cm/s AV Vmean:           89.000 cm/s AV VTI:            0.178 m AV Peak Grad:      6.6 mmHg AV Mean Grad:      4.0 mmHg LVOT Vmax:         106.00 cm/s LVOT Vmean:        71.000 cm/s LVOT VTI:          0.138 m LVOT/AV VTI ratio: 0.78  AORTA Ao Root diam: 3.20 cm Ao Asc diam:  2.80 cm TRICUSPID VALVE TR Peak grad:   32.7 mmHg TR Vmax:        286.00 cm/s  SHUNTS Systemic VTI:  0.14 m Systemic Diam: 2.10 cm Candee Furbish MD Electronically signed by Candee Furbish MD Signature Date/Time: 08/09/2021/11:45:06 AM    Final     Cardiac Studies   Echo 08/08/21:  1. Possible takotsubo or stress induced cardiomyopathy.   2. Left ventricular ejection fraction, by estimation, is 35 to 40%. The  left ventricle has moderately decreased function. The left ventricle has  no regional wall motion abnormalities. There is severe left ventricular  hypertrophy. Left ventricular  diastolic parameters are indeterminate.   3. Right ventricular systolic function is normal. The right ventricular  size is normal. There is mildly elevated pulmonary artery systolic  pressure. The estimated right ventricular systolic pressure is 17.4 mmHg.   4. The mitral valve is normal in structure. Mild mitral valve  regurgitation. No evidence of mitral stenosis.   5. Tricuspid valve regurgitation is moderate.   6. The aortic valve is tricuspid. There is mild calcification of the  aortic valve. Aortic valve regurgitation is mild. Aortic valve sclerosis  is present, with no evidence of aortic valve stenosis.   7. The inferior vena cava is normal in size with greater than 50%  respiratory variability, suggesting right atrial pressure of 3 mmHg.   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  Cardiology consulted for  possible atrial flutter and chest pain.  Assessment & Plan    # Chest pain:  EKG shows isolated ST elevation in lead V2 with no other reciprocal changes.  She had borderline  elevation on prior EKGs 01/2021.  Her chest pain is atypical and pleuritic in nature occurring in the setting of her pneumonia.  She also has chest wall tenderness.  High-sensitivity troponin has been mildly elevated to 102, but continues to downtrend.  LDL was 72.  We will start rosuvastatin 20.  She is very frail, SNF resident now s/p intubation and on tube feedings.  Currently tachycardic and recovering from her pneumonia.  After family discussion she continues to wish for aggressive care and full code.  Recommend outpatient stress testing when she is more medically stable.  If her tachycardia improves could consider a coronary CTA.    # Sinus tachycardia:  Initially there was concern for atrial flutter.  However she has been found to have sinus tachycardia.  Likely in the setting of her acute illness with pneumonia and septic shock.  She has hypothyroidism and is on levothyroxine.  TSH has been elevated but T3 and free T4 are within normal limits.  She is already on Coumadin for antiphospholipid syndrome.  Continue bisoprolol.  # Recurrent strokes: Continue Coumadin.  Persistent expressive aphasia.  # Acute systolic and diastolic heart failure: LVEF this admission 35 to 40% with global hypokinesis and severe LVH.  Planning for ischemic evaluation as above.  She is euvolemic on exam.  Continue bisoprolol and titrate GDMT as BP tolerates.      For questions or updates, please contact [place] Please consult [email or web address] for contact info under        Signed, Skeet Latch, MD  08/11/2021, 9:17 AM

## 2021-08-11 NOTE — Progress Notes (Addendum)
ANTICOAGULATION CONSULT NOTE - Follow Up Consult ? ?Pharmacy Consult for Warfarin ?Indication: history of pulmonary embolus and SLE ? ?Allergies  ?Allergen Reactions  ? Oxycodone-Acetaminophen Shortness Of Breath and Nausea Only  ? Propoxyphene Nausea Only and Shortness Of Breath  ?  States takes tylenol at home  ? Sulfonamide Derivatives Shortness Of Breath and Nausea Only  ? Codeine Nausea Only  ? Hydrocodone-Acetaminophen   ?  unknown  ? Hydromorphone   ?  On MAR  ? Other Other (See Comments)  ?  No blood, Jehovaeh Witness   ? Sulfamethoxazole   ? Tape   ?  Redness**PAPER TAPE OK**  ? Gabapentin Anxiety  ?  twitching  ? Latex Rash  ? Metoprolol Rash  ? Morphine And Related Rash  ?  IV site on arm is red, patient reports this is improving.  NO shortness of breath reported. Patient is given dose of Benadryl when taking Morphine   ? Rosiglitazone Rash  ? ? ?Patient Measurements: ?Height: 5' (152.4 cm) ?Weight: 86.6 kg (190 lb 14.7 oz) ?IBW/kg (Calculated) : 45.5 ?Heparin Dosing Weight: 63.7 kg ? ?Vital Signs: ?Temp: 98.7 ?F (37.1 ?C) (03/07 0700) ?Temp Source: Oral (03/07 0700) ?BP: 113/77 (03/07 0700) ?Pulse Rate: 114 (03/07 0742) ? ?Labs: ?Recent Labs  ?  08/09/21 ?4401 08/09/21 ?0272 08/09/21 ?0206 08/09/21 ?1240 08/10/21 ?5366 08/10/21 ?4403 08/10/21 ?1040 08/11/21 ?0350  ?HGB 5.3*  --  8.2*  --   --   --   --   --   ?HCT 16.6*  --  25.3*  --   --   --   --   --   ?PLT 134*  --  198  --   --   --   --   --   ?LABPROT 31.9*  --  28.0*  --  23.8*  --   --  21.1*  ?INR 3.1*  --  2.6*  --  2.1*  --   --  1.8*  ?CREATININE  --   --  0.98  --  0.91  --   --  0.85  ?TROPONINIHS  --    < > 102* 78*  --  56* 47*  --   ? < > = values in this interval not displayed.  ? ? ? ?Estimated Creatinine Clearance: 68.8 mL/min (by C-G formula based on SCr of 0.85 mg/dL). ? ?Assessment: ?61 yo female on warfarin PTA for hx PE and SLE. INR supratherapeutic at 5.3 on admission and very labile with IV vitamin K doses. Patient is very  malnourished. Labile INR likely due to severe vitamin K deficiency so started on 100 mcg vitamin K PO daily 3/4 with improvement in INR stability. Patient is also on tube feeds, which may decrease warfarin absorption. Pharmacy consulted for warfarin.  ? ?INR down to 1.8 is subtherapeutic. Will increase dose. Discussed enoxaparin bridge with MD, who agreed with starting.    ? ?Warfarin PTA:  3.'5mg'$  and '4mg'$  alternating days  ? ?Goal of Therapy:  ?INR 2-3 ?Monitor platelets by anticoagulation protocol: Yes ?  ?Plan:  ?Warfarin 4 mg x1-give now rather than at 1600  ?Enoxaparin 90 mg Q 12 hr until INR is greater than 2  ?Vitamin K 100 mcg po qday ?Monitor daily INR ?Monitor for signs/symptoms of bleeding  ? ?Benetta Spar, PharmD, BCPS, BCCP ?Clinical Pharmacist ? ?Please check AMION for all [place] phone numbers ?After 10:00 PM, call [place] [phone] ? ?

## 2021-08-11 NOTE — Progress Notes (Signed)
?                                                   ?  Palliative Care Progress Note, Assessment & Plan  ? ?Patient Name: Connie Ruiz       Date: 08/11/2021 ?DOB: Feb 19, 1961  Age: 61 y.o. MRN#: [id number] ?Attending Physician: Jonetta Osgood, MD ?Primary Care Physician: [name], [name], MD ?Admit Date: 08/02/2021 ? ?Reason for Consultation/Follow-up: Establishing goals of care ? ?HPI: ?61 y.o. female  with past medical history of antiphospholipid syndrome (Coumadin), multiple CVAs, hypthyroidism (Synthroid), vascular dementia (Aricept), kidney transplant, and lupus admitted on 08/02/2021 with acute respiratory distress, fever, and suspected aspiration PNA. Pt was intubated and successfully extubated during this hospitalization. NG tube for feeding remains.  ? ?Summary of counseling/coordination of care: ?I responded to a phone call from patient's daughter requesting follow-up.  I spoke with patient's daughter to tolerate over the phone.  She Connie Ruiz shared she is confused about what her mother's current health status is.  I reiterated attending Dr. Nena Alexander evaluation of patient's state- history of strokes, and severe deconditioning, patient remains at risk for aspiration.  Also discussed HF and cardiology's recommendations for evaluation if and when patient is more stable. ? ?She said she just wants to know if her mom is going to get better.  I shared that there is no crystal ball but that she has several comorbidities that are contributing to an overall poor prognosis.  ? ?I reiterated that PMT is an extra layer support for patient and family and facilitates discussions of goals of care and outcomes of these decisions.  I again reviewed as I did with Connie Ruiz and patient yesterday that patient has made her wishes very clear.  Patient is aware of her tenuous state and high risk  for decompensation and wishes to remain a full code with full scope of treatment.  Connie Ruiz confirms understanding of patient's wishes. ? ?PMT will continue to shadow the patient's chart and intervene at family's request or if patient's status declines.  ? ?Code Status: ?Full code ? ?Prognosis: ?Unable to determine ? ?Discharge Planning: ?To Be Determined ? ?Recommendations/Plan: ?Full code/full scope ?Patient is accepting of all offered, available, and appropriate medical interventions to sustain her life ? ?Care plan was discussed with Connie Ruiz (patient's daughter) ?        ? ?Palliative Assessment/Data: 30% ? ? ? ?Total Time 25 minutes  ?Greater than 50%  of this time was spent counseling and coordinating care related to the above assessment and plan. ? ?Thank you for allowing the Palliative Medicine Team to assist in the care of this patient. ? ?Verdell Carmine. Kolette Vey, DNP, FNP-BC ?Palliative Medicine Team ?Team Phone # [phone] ?  ?

## 2021-08-12 DIAGNOSIS — J9601 Acute respiratory failure with hypoxia: Secondary | ICD-10-CM | POA: Diagnosis not present

## 2021-08-12 DIAGNOSIS — I10 Essential (primary) hypertension: Secondary | ICD-10-CM | POA: Diagnosis not present

## 2021-08-12 DIAGNOSIS — I5041 Acute combined systolic (congestive) and diastolic (congestive) heart failure: Secondary | ICD-10-CM | POA: Diagnosis not present

## 2021-08-12 DIAGNOSIS — I248 Other forms of acute ischemic heart disease: Secondary | ICD-10-CM | POA: Diagnosis not present

## 2021-08-12 LAB — GLUCOSE, CAPILLARY
Glucose-Capillary: 120 mg/dL — ABNORMAL HIGH (ref 70–99)
Glucose-Capillary: 128 mg/dL — ABNORMAL HIGH (ref 70–99)
Glucose-Capillary: 159 mg/dL — ABNORMAL HIGH (ref 70–99)
Glucose-Capillary: 178 mg/dL — ABNORMAL HIGH (ref 70–99)
Glucose-Capillary: 194 mg/dL — ABNORMAL HIGH (ref 70–99)
Glucose-Capillary: 94 mg/dL (ref 70–99)

## 2021-08-12 LAB — BASIC METABOLIC PANEL
Anion gap: 7 (ref 5–15)
BUN: 36 mg/dL — ABNORMAL HIGH (ref 6–20)
CO2: 32 mmol/L (ref 22–32)
Calcium: 8.4 mg/dL — ABNORMAL LOW (ref 8.9–10.3)
Chloride: 104 mmol/L (ref 98–111)
Creatinine, Ser: 0.91 mg/dL (ref 0.44–1.00)
GFR, Estimated: >60 mL/min (ref >60)
Glucose, Bld: 123 mg/dL — ABNORMAL HIGH (ref 70–99)
Potassium: 3.9 mmol/L (ref 3.5–5.1)
Sodium: 143 mmol/L (ref 135–145)

## 2021-08-12 LAB — CBC
HCT: 23.3 % — ABNORMAL LOW (ref 36.0–46.0)
Hemoglobin: 7.3 g/dL — ABNORMAL LOW (ref 12.0–15.0)
MCH: 29.1 pg (ref 26.0–34.0)
MCHC: 31.3 g/dL (ref 30.0–36.0)
MCV: 92.8 fL (ref 80.0–100.0)
Platelets: 201 10*3/uL (ref 150–400)
RBC: 2.51 MIL/uL — ABNORMAL LOW (ref 3.87–5.11)
RDW: 17.5 % — ABNORMAL HIGH (ref 11.5–15.5)
WBC: 16.5 10*3/uL — ABNORMAL HIGH (ref 4.0–10.5)
nRBC: 0.1 % (ref 0.0–0.2)

## 2021-08-12 LAB — PROTIME-INR
INR: 1.9 — ABNORMAL HIGH (ref 0.8–1.2)
Prothrombin Time: 21.5 seconds — ABNORMAL HIGH (ref 11.4–15.2)

## 2021-08-12 MED ORDER — PHYTONADIONE 100 MCG PO TABS: 100.0000 ug | ORAL_TABLET | Freq: Every evening | ORAL | Status: DC

## 2021-08-12 MED ORDER — ACETAMINOPHEN 650 MG RE SUPP: 650.0000 mg | Freq: Four times a day (QID) | RECTAL | Status: DC | PRN

## 2021-08-12 MED ORDER — WARFARIN SODIUM 4 MG PO TABS
4.0000 mg | ORAL_TABLET | Freq: Once | ORAL | Status: AC
  Administered 2021-08-12: 4 mg
  Filled 2021-08-12: qty 1

## 2021-08-12 MED ORDER — POLYETHYLENE GLYCOL 3350 17 G PO PACK: 17.0000 g | PACK | Freq: Every day | ORAL | Status: DC | PRN

## 2021-08-12 MED ORDER — ROSUVASTATIN CALCIUM 5 MG PO TABS
10.0000 mg | ORAL_TABLET | Freq: Every day | ORAL | Status: DC
Start: 2021-08-13 — End: 2021-08-15
  Administered 2021-08-13 – 2021-08-14 (×2): 10 mg via ORAL
  Filled 2021-08-12 (×2): qty 2

## 2021-08-12 MED ORDER — PREDNISONE 5 MG PO TABS
5.0000 mg | ORAL_TABLET | Freq: Every day | ORAL | Status: DC
Start: 2021-08-13 — End: 2021-08-15
  Administered 2021-08-13 – 2021-08-14 (×2): 5 mg via ORAL
  Filled 2021-08-12 (×2): qty 1

## 2021-08-12 MED ORDER — LEVOTHYROXINE SODIUM 75 MCG PO TABS
150.0000 ug | ORAL_TABLET | Freq: Every day | ORAL | Status: DC
  Administered 2021-08-13 – 2021-08-14 (×2): 150 ug via ORAL
  Filled 2021-08-12 (×3): qty 2

## 2021-08-12 MED ORDER — DOCUSATE SODIUM 50 MG/5ML PO LIQD: 100.0000 mg | Freq: Two times a day (BID) | ORAL | Status: DC | PRN

## 2021-08-12 MED ORDER — PANTOPRAZOLE SODIUM 40 MG IV SOLR
40.0000 mg | Freq: Two times a day (BID) | INTRAVENOUS | Status: DC
  Administered 2021-08-12 – 2021-08-14 (×5): 40 mg via INTRAVENOUS
  Filled 2021-08-12 (×5): qty 10

## 2021-08-12 MED ORDER — PHYTONADIONE 100 MCG PO TABS
100.0000 ug | ORAL_TABLET | Freq: Every evening | ORAL | Status: DC
  Administered 2021-08-13: 15:00:00 100 ug via ORAL
  Filled 2021-08-12 (×8): qty 1

## 2021-08-12 MED ORDER — ACETAMINOPHEN 325 MG PO TABS
650.0000 mg | ORAL_TABLET | Freq: Four times a day (QID) | ORAL | Status: DC | PRN
  Administered 2021-08-13: 15:00:00 650 mg via ORAL
  Filled 2021-08-12: qty 2

## 2021-08-12 MED ORDER — BISOPROLOL FUMARATE 5 MG PO TABS
5.0000 mg | ORAL_TABLET | Freq: Every day | ORAL | Status: DC
  Administered 2021-08-13 – 2021-08-14 (×2): 5 mg via ORAL
  Filled 2021-08-12 (×2): qty 1

## 2021-08-12 NOTE — Progress Notes (Addendum)
PROGRESS NOTE        PATIENT DETAILS Name: Connie Ruiz Age: 61 y.o. Sex: female Date of Birth: Sep 24, 1960 Admit Date: 08/02/2021 Admitting Physician Chesley Mires, MD [id number], Malka So, MD  Brief Summary: Patient is a 61 y.o.  female with history of kidney transplant, VTE/antiphospholipid syndrome on chronic Coumadin therapy, CVA with residual aphasia-slight right-sided deficits-resident of SNF who presented to the hospital with acute respiratory distress and fever, she was intubated in the emergency room-and managed in the ICU.  She was subsequently stabilized-extubated on 2/28-unfortunately-she required insertion of NG tube for feedings.  Her hypoxia gradually improved-and patient was transferred to [facility] service on 3/3.  Further hospital course was complicated by development of  volume overload-she was found to have new onset systolic heart failure.  See below for further details.  Significant Hospital events: 2/26>> admit to ICU-intubated. 2/27>> On vent, remains on Levophed 61mg, Vanc, Cefepime  2/28>> [facility]  [facility]>> MBS completed-dysphagia 1 diet started-NG tube removed 2/08>>poor oral intake-calorie count started   Significant imaging studies: 2/26>> CXR: RLL infiltrate. 2/26>> CT head: No acute intracranial findings, chronic left MCA territory infarct. 3/01>> CT head: No acute intracranial abnormalities. 3/01>> RUQ ultrasound: Subtle nodularity of the liver-nonspecific-could reflect early cirrhosis. 3/05>> Echo: EF 35-40%, RVSP 40.7 mmHg.  Significant microbiology data: 2/26>> COVID/influenza PCR: Negative 2/26>> blood culture: Negative 2/27>> urine culture: Negative  Procedures: ETT>>2/26-2/28  Consults:  CCM, nephrology  Subjective: Does not have much of an appetite-NG tube was removed yesterday.  Had some recurrent chest pain last night.  Objective: Vitals: Blood pressure 128/75, pulse (!) 112, temperature 99.1  F (37.3 C), temperature source Oral, resp. rate 15, height 5' (1.524 m), weight 86.6 kg, SpO2 96 %.   Exam: Gen Exam:Alert awake-not in any distress HEENT:atraumatic, normocephalic Chest: Still wheezing-but less compared to the past few days. CVS:S1S2 regular Abdomen:soft non tender, non distended Extremities:++ edema Neurology: Non focal Skin: no rash   Pertinent Labs/Radiology: CBC Latest Ref Rng & Units 08/12/2021 08/09/2021 08/09/2021  WBC 4.0 - 10.5 K/uL 16.5(H) 14.2(H) 9.3  Hemoglobin 12.0 - 15.0 g/dL 7.3(L) 8.2(L) 5.3(LL)  Hematocrit 36.0 - 46.0 % 23.3(L) 25.3(L) 16.6(L)  Platelets 150 - 400 K/uL 201 198 134(L)    Lab Results  Component Value Date   NA 143 08/12/2021   K 3.9 08/12/2021   CL 104 08/12/2021   CO2 32 08/12/2021      Assessment/Plan: Acute hypoxic respiratory failure likely due to aspiration pneumonia: Hypoxia has improved-completed 7 days of Zosyn.  Cultures negative so far.  Leukocytosis has worsened significantly today-she clinically remains unchanged-however with her prior history of CVA/dysarthria she remains at risk for further aspiration episodes.  For now monitor closely off antimicrobial therapy-plan to repeat CXR tomorrow. Continue to maintain aspiration precautions-keep head end up at 60-90 degree angle, continue Robinul, encourage incentive spirometry/flutter valve.    Septic shock (present on admission): Sepsis physiology resolved-cultures negative so far-completed antibiotic course..Earnestine LeysCKD stage IIIa-history of renal transplant: AKI likely hemodynamically mediated-renal function has improved.  No longer on stress dose steroids-on chronic dosing of prednisone, on tacrolimus.  Neurology has signed off.  Follow renal function closely as patient is on IV Lasix.    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  Continue IV Lasix-watch renal  function closely.  Remains on bisoprolol-cardiology adding low-dose losartan. Unclear whether this is Takotsubo cardiomyopathy or ischemic etiology.  Cardiology plans to pursue ischemic evaluation if/when she is more stable.    Sinus tachycardia: Remains stable-with heart rate in the low 100s range-on bisoprolol.   Likely provoked by acute illness/PNA/physiological stress.    Prolonged QTc: Likely due to hypomagnesemia-QTc much better after replacing magnesium.  Continue telemetry monitoring-avoid QTc prolonging agents.  Atypical chest pain: Continues to have chest pain for the past 2-3 days-on PPI twice daily-troponins negative.  Cardiology following.  Dysphagia: Likely chronic-worsened due to severe debility from acute illness-cortak tube in place-feedings ongoing.  SLP followed closely-underwent MBS on 3/7-following which a dysphagia 1 diet was started-NG tube was removed.  Seems to be tolerating oral intake well (although poor oral intake).   Hypokalemia/hypophosphatemia: Repleted.  Normocytic anemia: Likely due to critical illness-no evidence of blood loss-follow closely-patient is a [name].  DM-2 (A1c 6.0 on 05/27/2021) with steroid-induced hyperglycemia: Continue SSI-steroids being tapered down-CBGs should improve.  Recent Labs    08/12/21 0436 08/12/21 0733 08/12/21 1139  GLUCAP 120* 128* 194*      HTN: BP stable-continue bisoprolol.  Coreg discontinued due to bronchospasm.  History of PE-antiphospholipid syndrome: INR slightly subtherapeutic-pharmacy following-on Coumadin and overlapping Lovenox.    History of multiple CVA's with chronic aphasia/right-sided weakness: At baseline-CT head x2 negative for acute abnormalities.  Resident of SNF.  Vascular dementia: We will resume Aricept, Prozac, mirtazapine and amitriptyline over the next few days.  Hypothyroidism: On levothyroxine.  TSH mildly elevated-further optimization of levothyroxine dosing can be done by PCP  in the outpatient setting, unclear to me when it was last adjusted.  Refusal of blood transfusions as patient is [name]  Debility/Deconditioning: Chronically frail and weak-Per daughter-patient has been very minimally ambulatory for the past 1 year and is mostly bed to wheelchair bound.  Her last CVA was 8 years ago.    Failure to thrive syndrome: Very poor oral intake continues-watch closely.  Per patient-she does not want to pursue a PEG tube if her oral intake remains poor.  Family/patient aware that placing a PEG tube will not prevent further aspiration episodes.  Calorie count in progress-if it appears that she does not have enough caloric intake to maintain life-May need to talk to family about hospice/end-of-life care.  Palliative care: Full code-difficult situation-frail/bedbound-now with significant debility/deconditioning-and severe dysphagia-continues to accumulate secretions.  Long discussion with daughter on 3/4-she is aware of tenuous clinical situation-palliative care following.  Nutrition Status: Nutrition Problem: Moderate Malnutrition Etiology: chronic illness (CHF, CVA) Signs/Symptoms: mild fat depletion, moderate muscle depletion, percent weight loss (16% weight loss in 6 months) Percent weight loss: 16 % (6 months) Interventions: Tube feeding, Prostat   BMI: Estimated body mass index is 37.29 kg/m as calculated from the following:   Height as of this encounter: 5' (1.524 m).   Weight as of this encounter: 86.6 kg.   Code status:   Code Status: Full Code   DVT Prophylaxis: Place and maintain sequential compression device Start: 08/02/21 1242 SCDs Start: 08/02/21 1209 warfarin (COUMADIN) tablet 4 mg    Family Communication: Daughter Rikia Sukhu  [phone] left voicemail 3/8   Disposition Plan: Status is: Inpatient Remains inpatient appropriate because: Severe failure to thrive syndrome-HFrEF exacerbation-resolving sepsis-poor oral intake-not  yet stable for discharge-see above documentation.    Planned Discharge Destination:Skilled nursing facility   Diet: Diet Order  DIET - DYS 1 Room service appropriate? Yes with Assist; Fluid consistency: Nectar Thick  Diet effective now                     Antimicrobial agents: Anti-infectives (From admission, onward)    Start     Dose/Rate Route Frequency Ordered Stop   08/03/21 1500  vancomycin (VANCOREADY) IVPB 750 mg/150 mL  Status:  Discontinued        750 mg 150 mL/hr over 60 Minutes Intravenous Every 24 hours 08/02/21 1456 08/03/21 0912   08/03/21 0630  cefTRIAXone (ROCEPHIN) 2 g in sodium chloride 0.9 % 100 mL IVPB  Status:  Discontinued        2 g 200 mL/hr over 30 Minutes Intravenous Every 24 hours 08/02/21 1209 08/02/21 1246   08/02/21 2100  piperacillin-tazobactam (ZOSYN) IVPB 3.375 g       See Hyperspace for full Linked Orders Report.   3.375 g 12.5 mL/hr over 240 Minutes Intravenous Every 8 hours 08/02/21 1253 08/08/21 2359   08/02/21 1345  vancomycin (VANCOREADY) IVPB 1750 mg/350 mL        1,750 mg 175 mL/hr over 120 Minutes Intravenous  Once 08/02/21 1343 08/02/21 1721   08/02/21 1300  vancomycin (VANCOREADY) IVPB 2000 mg/400 mL  Status:  Discontinued        2,000 mg 200 mL/hr over 120 Minutes Intravenous  Once 08/02/21 1253 08/02/21 1343   08/02/21 1300  piperacillin-tazobactam (ZOSYN) IVPB 3.375 g       See Hyperspace for full Linked Orders Report.   3.375 g 100 mL/hr over 30 Minutes Intravenous  Once 08/02/21 1253 08/02/21 1555   08/02/21 0630  cefTRIAXone (ROCEPHIN) 2 g in sodium chloride 0.9 % 100 mL IVPB        2 g 200 mL/hr over 30 Minutes Intravenous  Once 08/02/21 0618 08/02/21 0807   08/02/21 0630  azithromycin (ZITHROMAX) 500 mg in sodium chloride 0.9 % 250 mL IVPB  Status:  Discontinued        500 mg 250 mL/hr over 60 Minutes Intravenous Every 24 hours 08/02/21 0618 08/02/21 1246        MEDICATIONS: Scheduled Meds:  [START  ON 08/13/2021] bisoprolol  5 mg Oral Daily   chlorhexidine  15 mL Mouth Rinse BID   Chlorhexidine Gluconate Cloth  6 each Topical Daily   enoxaparin (LOVENOX) injection  90 mg Subcutaneous Q12H   furosemide  40 mg Intravenous BID   glycopyrrolate  0.1 mg Intravenous TID   insulin aspart  0-9 Units Subcutaneous Q4H   levalbuterol  0.63 mg Nebulization TID   And   ipratropium  0.5 mg Nebulization TID   [START ON 08/13/2021] levothyroxine  150 mcg Oral Q0600   mouth rinse  15 mL Mouth Rinse q12n4p   pantoprazole (PROTONIX) IV  40 mg Intravenous Q12H   Phytonadione  100 mcg Oral QPM   [START ON 08/13/2021] predniSONE  5 mg Oral Q breakfast   [START ON 08/13/2021] rosuvastatin  10 mg Oral Daily   sodium chloride flush  3 mL Intravenous Q12H   tacrolimus  1 mg Sublingual BID   warfarin  4 mg Per Tube ONCE-1600   Warfarin - Pharmacist Dosing Inpatient   Does not apply q1600   Continuous Infusions:  sodium chloride Stopped (08/06/21 1000)   PRN Meds:.acetaminophen **OR** acetaminophen, albuterol, docusate, fentaNYL (SUBLIMAZE) injection, lidocaine (PF), nitroGLYCERIN, polyethylene glycol, prochlorperazine   I have personally reviewed following labs and imaging studies  LABORATORY DATA: CBC: Recent Labs  Lab 08/07/21 0354 08/08/21 0420 08/09/21 0046 08/09/21 0206 08/12/21 0547  WBC 10.9* 12.3* 9.3 14.2* 16.5*  HGB 8.8* 8.5* 5.3* 8.2* 7.3*  HCT 26.6* 26.2* 16.6* 25.3* 23.3*  MCV 87.8 87.9 91.2 88.5 92.8  PLT 170 217 134* 198 201     Basic Metabolic Panel: Recent Labs  Lab 08/07/21 0354 08/07/21 1620 08/08/21 0420 08/09/21 0206 08/10/21 0433 08/11/21 0350 08/12/21 0547  NA 142  --  143 142 141 140 143  K 2.8*  --  3.8 3.8 4.0 4.1 3.9  CL 112*  --  110 110 107 103 104  CO2 22  --  '22 25 28 30 '$ 32  GLUCOSE 283*  --  279* 266* 219* 238* 123*  BUN 29*  --  28* 26* 27* 35* 36*  CREATININE 1.07*  --  1.00 0.98 0.91 0.85 0.91  CALCIUM 8.2*  --  8.2* 8.0* 8.1* 8.2* 8.4*  MG 2.1  2.0 2.0 1.7 1.6* 2.3  --   PHOS 1.9* 2.9 2.4* 4.0 4.1  --   --      GFR: Estimated Creatinine Clearance: 64.2 mL/min (by C-G formula based on SCr of 0.91 mg/dL).  Liver Function Tests: Recent Labs  Lab 08/09/21 0206  AST 10*  ALT 9  ALKPHOS 35*  BILITOT 0.4  PROT 4.8*  ALBUMIN 1.8*    No results for input(s): LIPASE, AMYLASE in the last 168 hours. No results for input(s): AMMONIA in the last 168 hours.  Coagulation Profile: Recent Labs  Lab 08/09/21 0046 08/09/21 0206 08/10/21 0433 08/11/21 0350 08/12/21 0547  INR 3.1* 2.6* 2.1* 1.8* 1.9*     Cardiac Enzymes: No results for input(s): CKTOTAL, CKMB, CKMBINDEX, TROPONINI in the last 168 hours.  BNP (last 3 results) No results for input(s): PROBNP in the last 8760 hours.  Lipid Profile: Recent Labs    08/10/21 0433  CHOL 143  HDL 37*  LDLCALC 72  TRIG 169*  CHOLHDL 3.9     Thyroid Function Tests: No results for input(s): TSH, T4TOTAL, FREET4, T3FREE, THYROIDAB in the last 72 hours.   Anemia Panel: No results for input(s): VITAMINB12, FOLATE, FERRITIN, TIBC, IRON, RETICCTPCT in the last 72 hours.  Urine analysis:    Component Value Date/Time   COLORURINE YELLOW 08/03/2021 0032   APPEARANCEUR CLOUDY (A) 08/03/2021 0032   LABSPEC 1.029 08/03/2021 0032   PHURINE 5.0 08/03/2021 0032   GLUCOSEU NEGATIVE 08/03/2021 0032   GLUCOSEU NEGATIVE 10/22/2013 1621   HGBUR NEGATIVE 08/03/2021 0032   BILIRUBINUR NEGATIVE 08/03/2021 0032   KETONESUR 5 (A) 08/03/2021 0032   PROTEINUR 30 (A) 08/03/2021 0032   UROBILINOGEN 1.0 03/27/2015 1534   NITRITE NEGATIVE 08/03/2021 0032   LEUKOCYTESUR TRACE (A) 08/03/2021 0032    Sepsis Labs: Lactic Acid, Venous    Component Value Date/Time   LATICACIDVEN 0.8 08/02/2021 0511    MICROBIOLOGY: Recent Results (from the past 240 hour(s))  Urine Culture     Status: None   Collection Time: 08/03/21 12:21 AM   Specimen: In/Out Cath Urine  Result Value Ref Range Status    Specimen Description IN/OUT CATH URINE  Final   Special Requests NONE  Final   Culture   Final    NO GROWTH Performed at [facility], [place] [address] [place], [place] [address]    Report Status 08/04/2021 FINAL  Final    RADIOLOGY STUDIES/RESULTS: DG Swallowing Func-Speech Pathology  Result Date: 08/11/2021 Table formatting from  the original result was not included. Objective Swallowing Evaluation: Type of Study: MBS-Modified Barium Swallow Study  Patient Details Name: JUNO ALERS MRN: [id number] Date of Birth: 06-28-1960 Today's Date: 08/11/2021 Time: SLP Start Time (ACUTE ONLY): 6761 -SLP Stop Time (ACUTE ONLY): 1400 SLP Time Calculation (min) (ACUTE ONLY): 25 min Past Medical History: Past Medical History: Diagnosis Date  Anxiety   Candida esophagitis ([place]) 11/12/2014  CEREBROVASCULAR ACCIDENT, ACUTE 04/15/2010  CLOSTRIDIUM DIFFICILE COLITIS, HX OF 08/21/2007  CONGESTIVE HEART FAILURE 08/21/2007  Current use of long term anticoagulation   Dr. Andree Elk, [facility]  CVA 04/17/2010  Depression   Dr. Andree Elk, [place], TYPE II 08/21/2007  DVT, HX OF 08/21/2007  GERD 08/21/2007  GOUT 08/21/2007  History of stroke with residual effects   HYPERLIPIDEMIA 08/21/2007  HYPERTENSION 08/21/2007  Dr. Andree Elk, [place], [place] OF 08/22/2007  s/p renal transplant-Dr. Bonney Leitz  LUPUS 08/21/2007  OSTEOPOROSIS 08/21/2007  Rheumatol at [facility]  Pulmonary embolism ([place]) 07/16/2010  Renal failure   RENAL INSUFFICIENCY 08/21/2007  Right sided weakness   Steroid-induced hyperglycemia 11/09/2014  Tachycardia   THYROID NODULE, LEFT 04/10/2009 Past Surgical History: Past Surgical History: Procedure Laterality Date  BIOPSY  02/04/2021  Procedure: BIOPSY;  Surgeon: [name], MD;  Location: [facility] ENDOSCOPY;  Service: Endoscopy;;  CESAREAN SECTION    CHOLECYSTECTOMY    COLONOSCOPY  06/2000  [facility]   ENTEROSCOPY N/A 11/11/2014  Procedure: ENTEROSCOPY;  Surgeon: Ladene Artist, MD;   Location: [facility] ENDOSCOPY;  Service: Endoscopy;  Laterality: N/A;  FLEXIBLE SIGMOIDOSCOPY N/A 02/04/2021  Procedure: FLEXIBLE SIGMOIDOSCOPY;  Surgeon: [name], MD;  Location: [facility] ENDOSCOPY;  Service: Endoscopy;  Laterality: N/A;  KIDNEY TRANSPLANT Right 2009  RENAL BIOPSY, OPEN  1981  TUBAL LIGATION   HPI: Patient is a 61 y.o. 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 She was admitted on 2/26 with fever, hypoxia and found to have aspiration PNA. Intubated 2/26-28. Dx acute hypoxic resp failure likely due to aspiration pna, septic shock (resolved), deconditioning. Pt has had cortrak, has been NPO.  Subjective: alert  Recommendations for follow up therapy are one component of a multi-disciplinary discharge planning process, led by the attending physician.  Recommendations may be updated based on patient status, additional functional criteria and insurance authorization. Assessment / Plan / Recommendation Clinical Impressions 08/11/2021 Clinical Impression Pt presents with a mod-severe oral and mild pharyngeal dysphagia. Oral phase is marked by significant difficulty with initiation of motor sequence for swallowing.  There are subtle movements and tremors noted in tongue - significant time passes before the tongue can propel material into the pharynx. When the swallow was triggered,  thin liquids were aspirated in trace amounts; nectar thick liquids did not penetrate the larynx/ no aspiration.  There was no pharyngeal residue post-swallow.  Recommend initiating a dysphagia 1 diet, nectar thick liquids for now; crush meds in puree. SLP Visit Diagnosis Dysphagia, oral phase (R13.11) Attention and concentration deficit following -- Frontal lobe and executive function deficit following -- Impact on safety and function Mild aspiration risk   Treatment Recommendations 08/11/2021 Treatment Recommendations Therapy as  outlined in treatment plan below   Prognosis 08/11/2021 Prognosis for Safe Diet Advancement Fair Barriers to Reach Goals -- Barriers/Prognosis Comment -- Diet Recommendations 08/11/2021 SLP Diet Recommendations Thin liquid;Dysphagia 1 (Puree) solids Liquid Administration via Cup;Straw Medication Administration Crushed with puree Compensations Minimize environmental distractions Postural Changes --  Other Recommendations 08/11/2021 Recommended Consults -- Oral Care Recommendations Oral care BID Other Recommendations Order thickener from pharmacy Follow Up Recommendations Skilled nursing-short term rehab (<3 hours/day) Assistance recommended at discharge Frequent or constant Supervision/Assistance Functional Status Assessment Patient has had a recent decline in their functional status and demonstrates the ability to make significant improvements in function in a reasonable and predictable amount of time. Frequency and Duration  08/11/2021 Speech Therapy Frequency (ACUTE ONLY) min 2x/week Treatment Duration 2 weeks   Oral Phase 08/11/2021 Oral Phase Impaired Oral - Pudding Teaspoon -- Oral - Pudding Cup -- Oral - Honey Teaspoon -- Oral - Honey Cup -- Oral - Nectar Teaspoon -- Oral - Nectar Cup -- Oral - Nectar Straw Weak lingual manipulation;Reduced posterior propulsion;Holding of bolus;Delayed oral transit Oral - Thin Teaspoon -- Oral - Thin Cup -- Oral - Thin Straw Weak lingual manipulation;Reduced posterior propulsion;Holding of bolus;Delayed oral transit Oral - Puree Weak lingual manipulation;Reduced posterior propulsion;Holding of bolus;Delayed oral transit Oral - Mech Soft -- Oral - Regular -- Oral - Multi-Consistency -- Oral - Pill -- Oral Phase - Comment --  Pharyngeal Phase 08/11/2021 Pharyngeal Phase Impaired Pharyngeal- Pudding Teaspoon -- Pharyngeal -- Pharyngeal- Pudding Cup -- Pharyngeal -- Pharyngeal- Honey Teaspoon -- Pharyngeal -- Pharyngeal- Honey Cup -- Pharyngeal -- Pharyngeal- Nectar Teaspoon -- Pharyngeal --  Pharyngeal- Nectar Cup -- Pharyngeal -- Pharyngeal- Nectar Straw Delayed swallow initiation-pyriform sinuses Pharyngeal -- Pharyngeal- Thin Teaspoon -- Pharyngeal -- Pharyngeal- Thin Cup -- Pharyngeal -- Pharyngeal- Thin Straw Delayed swallow initiation-pyriform sinuses;Penetration/Aspiration during swallow Pharyngeal Material enters airway, passes BELOW cords and not ejected out despite cough attempt by patient Pharyngeal- Puree Delayed swallow initiation-pyriform sinuses Pharyngeal -- Pharyngeal- Mechanical Soft -- Pharyngeal -- Pharyngeal- Regular -- Pharyngeal -- Pharyngeal- Multi-consistency -- Pharyngeal -- Pharyngeal- Pill -- Pharyngeal -- Pharyngeal Comment --  No flowsheet data found. Juan Quam Laurice 08/11/2021, 2:59 PM                       LOS: 10 days   Oren Binet, MD  [facility]    To contact the attending provider between 7A-7P or the covering provider during after hours 7P-7A, please log into the web site [email or web address] and access using universal [employer] password for that web site. If you do not have the password, please call the hospital operator.  08/12/2021, 1:47 PM

## 2021-08-12 NOTE — Consult Note (Signed)
? ?  [facility] CM Inpatient Consult ? ? ?08/12/2021 ? ?Delcia C Gallien ?[birth year] ?[id number] ? ?[place] Organization [ACO] Patient: [employer] ? ?Primary Care Provider:  [name], [name], MD ? ? ?Patient screened for hospitalization with noted extreme high risk score for unplanned readmission risk and to assess for potential [facility] service needs for post hospital transition.  Review of patient's medical record reveals patient is from a skilled nursing facility level of care prior to admission ? ? ?Plan:  No current follow up needs as patient is for a skilled nursing facility level of care. ? ?For questions contact:  ? ?Natividad Brood, RN BSN CCM ?[facility] Liaison ? [phone] business mobile phone ?Toll free office [phone]  ?Fax number: [phone] ?[email or web address]  ? ? ? ?

## 2021-08-12 NOTE — Progress Notes (Signed)
Speech Language Pathology Treatment: Dysphagia  ?Patient Details ?Name: Connie Ruiz ?MRN: [id number] ?DOB: Mar 26, 1961 ?Today's Date: 08/12/2021 ?Time: 1856-3149 ?SLP Time Calculation (min) (ACUTE ONLY): 20 min ? ?Assessment / Plan / Recommendation ?Clinical Impression ? Pt alert, talking a little more today.  RN present.  Cortrak D/Cd. Pt agrees she is more comfortable. She declined breakfast per RN.  Offered Ms. Pridgen some food choices, gently reminding her that she needed to eat/drink a little more today. She selected Ensure, which was thickened to nectar. Pt consumed 1-2 ounces then declined any further POs.  She took her pills whole in nectar and did well with them (she will likely be more willing to take her pills whole rather than crushed) Overall, she is apathetic about eating and drinking.  Recommend continuing dysphagia 1/nectar thick liquids. Reminded her that these consistencies are a starting point as she resumes eating - the goal would be to move toward her baseline diet of regular solids and thin liquids.  ? ?SLP will follow.  ?  ?HPI HPI: Patient is a 61 y.o. 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 She was admitted on 2/26 with fever, hypoxia and found to have aspiration PNA. Intubated 2/26-28. Dx acute hypoxic resp failure likely due to aspiration pna, septic shock (resolved), deconditioning. Pt has had cortrak, has been NPO. ?  ?   ?SLP Plan ? Continue with current plan of care ? ?  ?  ?Recommendations for follow up therapy are one component of a multi-disciplinary discharge planning process, led by the attending physician.  Recommendations may be updated based on patient status, additional functional criteria and insurance authorization. ?  ? ?Recommendations  ?Diet recommendations: Dysphagia 1 (puree);Nectar-thick liquid ?Liquids provided via: Cup;Straw ?Medication Administration: Other  (Comment) (crush if large) ?Supervision: Staff to assist with self feeding ?Compensations: Minimize environmental distractions  ?   ?    ?   ? ? ? ? Oral Care Recommendations: Oral care BID ?Follow Up Recommendations: Skilled nursing-short term rehab (<3 hours/day) ?Assistance recommended at discharge: Frequent or constant Supervision/Assistance ?SLP Visit Diagnosis: Dysphagia, oral phase (R13.11) ?Plan: Continue with current plan of care ? ? ? ? ?  ?  ? ?Miho Monda L. Marit Goodwill, MA CCC/SLP ?Acute Rehabilitation Services ?Office number [phone] ?Pager [phone] ? ?Juan Quam Laurice ? ?08/12/2021, 9:38 AM ?

## 2021-08-12 NOTE — Progress Notes (Incomplete)
Calorie Count Note: Day 1 Results  48-hour calorie count ordered. Calorie count was started on 3/08 at 1100 with lunch meal. Please see day 1 results below.  Diet: dysphagia 1 with nectar-thick liquids Supplements:  - Hormel Shake TID with meals - Mighty Shake TID with meals  Day 1: 3/08 Lunch: *** kcal, *** grams of protein 3/08 Dinner: *** kcal, *** grams of protein 3/09 Breakfast: *** kcal, *** grams of protein Supplements: *** kcal, *** grams of protein  Day 1 total 24-hour intake: *** kcal (***% of minimum estimated needs)  *** grams of protein (***% of minimum estimated needs)  Nutrition Diagnosis: Moderate Malnutrition related to chronic illness (CHF, CVA) as evidenced by mild fat depletion, moderate muscle depletion, percent weight loss (16% weight loss in 6 months).  Goal: Patient will meet greater than or equal to 90% of their needs.  Intervention:  - Continue 48-hour calorie count for 1 more day - ***   Gustavus Bryant, MS, RD, LDN Inpatient Clinical Dietitian Please see AMiON for contact information.

## 2021-08-12 NOTE — Progress Notes (Signed)
Progress Note  Patient Name: Connie Ruiz Date of Encounter: 08/12/2021  [facility] Cardiologist: Quay Burow, MD   Subjective   Expressive aphasia.  CP worse with palpation of her chest.  No SOB.   Inpatient Medications    Scheduled Meds:  [START ON 08/13/2021] bisoprolol  5 mg Oral Daily   chlorhexidine  15 mL Mouth Rinse BID   Chlorhexidine Gluconate Cloth  6 each Topical Daily   enoxaparin (LOVENOX) injection  90 mg Subcutaneous Q12H   furosemide  40 mg Intravenous BID   glycopyrrolate  0.1 mg Intravenous TID   insulin aspart  0-9 Units Subcutaneous Q4H   levalbuterol  0.63 mg Nebulization TID   And   ipratropium  0.5 mg Nebulization TID   [START ON 08/13/2021] levothyroxine  150 mcg Oral Q0600   mouth rinse  15 mL Mouth Rinse q12n4p   pantoprazole (PROTONIX) IV  40 mg Intravenous Q12H   [START ON 08/13/2021] predniSONE  5 mg Oral Q breakfast   [START ON 08/13/2021] rosuvastatin  10 mg Oral Daily   sodium chloride flush  3 mL Intravenous Q12H   tacrolimus  1 mg Sublingual BID   Warfarin - Pharmacist Dosing Inpatient   Does not apply q1600   Continuous Infusions:  sodium chloride Stopped (08/06/21 1000)   PRN Meds: acetaminophen **OR** acetaminophen, albuterol, docusate, fentaNYL (SUBLIMAZE) injection, lidocaine (PF), nitroGLYCERIN, polyethylene glycol, prochlorperazine   Vital Signs    Vitals:   08/12/21 0257 08/12/21 0403 08/12/21 0718 08/12/21 0735  BP: 111/76 (!) 106/50  123/75  Pulse: (!) 116 (!) 115  (!) 112  Resp: '14 15  19  '$ Temp: 98.7 F (37.1 C) 99.6 F (37.6 C)  99 F (37.2 C)  TempSrc: Oral Oral  Oral  SpO2: 99% 97% 96% 98%  Weight:      Height:        Intake/Output Summary (Last 24 hours) at 08/12/2021 0948 Last data filed at 08/12/2021 0700 Gross per 24 hour  Intake 380 ml  Output 1350 ml  Net -970 ml   Last 3 Weights 08/11/2021 08/10/2021 08/09/2021  Weight (lbs) 190 lb 14.7 oz 189 lb 2.5 oz 186 lb 8.2 oz  Weight (kg) 86.6 kg 85.8 kg 84.6 kg       Telemetry    Sinus tachycardia 100-110s. - Personally Reviewed  ECG    Sinus tachycardia.  Rate 119 bpm.  ST elevation V1 through V3, most prominent in V2.  Anterolateral T wave inversion. - Personally Reviewed  Physical Exam   VS:  BP 123/75    Pulse (!) 112    Temp 99 F (37.2 C) (Oral)    Resp 19    Ht 5' (1.524 m)    Wt 86.6 kg    SpO2 98%    BMI 37.29 kg/m  , BMI Body mass index is 37.29 kg/m. GENERAL: Chronically ill-appearing. HEENT: Bilateral proptosis.  Pupils equal round and reactive, fundi not visualized, oral mucosa unremarkable NECK:  No jugular venous distention, waveform within normal limits, carotid upstroke brisk and symmetric, no bruits, no thyromegaly LUNGS: Coarse breath sounds and rhonchi HEART:  RRR.  PMI not displaced or sustained,S1 and S2 within normal limits, no S3, no S4, no clicks, no rubs, no murmurs ABD:  Flat, positive bowel sounds normal in frequency in pitch, no bruits, no rebound, no guarding, no midline pulsatile mass, no hepatomegaly, no splenomegaly EXT:  2 plus pulses throughout, trace edema, no cyanosis no clubbing SKIN:  No rashes no nodules NEURO:  Cranial nerves II through XII grossly intact, motor grossly intact throughout PSYCH:  Cognitively intact, oriented to person place and time  Labs    High Sensitivity Troponin:   Recent Labs  Lab 08/09/21 0206 08/09/21 1240 08/10/21 0915 08/10/21 1040  TROPONINIHS 102* 78* 56* 47*     Chemistry Recent Labs  Lab 08/09/21 0206 08/10/21 0433 08/11/21 0350 08/12/21 0547  NA 142 141 140 143  K 3.8 4.0 4.1 3.9  CL 110 107 103 104  CO2 '25 28 30 '$ 32  GLUCOSE 266* 219* 238* 123*  BUN 26* 27* 35* 36*  CREATININE 0.98 0.91 0.85 0.91  CALCIUM 8.0* 8.1* 8.2* 8.4*  MG 1.7 1.6* 2.3  --   PROT 4.8*  --   --   --   ALBUMIN 1.8*  --   --   --   AST 10*  --   --   --   ALT 9  --   --   --   ALKPHOS 35*  --   --   --   BILITOT 0.4  --   --   --   GFRNONAA >60 >60 >60 >60  ANIONGAP '7 6 7 7     '$ Lipids  Recent Labs  Lab 08/10/21 0433  CHOL 143  TRIG 169*  HDL 37*  LDLCALC 72  CHOLHDL 3.9    Hematology Recent Labs  Lab 08/09/21 0046 08/09/21 0206 08/12/21 0547  WBC 9.3 14.2* 16.5*  RBC 1.82* 2.86* 2.51*  HGB 5.3* 8.2* 7.3*  HCT 16.6* 25.3* 23.3*  MCV 91.2 88.5 92.8  MCH 29.1 28.7 29.1  MCHC 31.9 32.4 31.3  RDW 16.9* 16.9* 17.5*  PLT 134* 198 201   Thyroid  Recent Labs  Lab 08/08/21 1832  TSH 8.378*  FREET4 1.04    BNPNo results for input(s): BNP, PROBNP in the last 168 hours.  DDimer No results for input(s): DDIMER in the last 168 hours.   Radiology    DG Swallowing Func-Speech Pathology  Result Date: 08/11/2021 Table formatting from the original result was not included. Objective Swallowing Evaluation: Type of Study: MBS-Modified Barium Swallow Study  Patient Details Name: Connie Ruiz MRN: [id number] Date of Birth: 1961-05-04 Today's Date: 08/11/2021 Time: SLP Start Time (ACUTE ONLY): 6546 -SLP Stop Time (ACUTE ONLY): 1400 SLP Time Calculation (min) (ACUTE ONLY): 25 min Past Medical History: Past Medical History: Diagnosis Date  Anxiety   Candida esophagitis ([place]) 11/12/2014  CEREBROVASCULAR ACCIDENT, ACUTE 04/15/2010  CLOSTRIDIUM DIFFICILE COLITIS, HX OF 08/21/2007  CONGESTIVE HEART FAILURE 08/21/2007  Current use of long term anticoagulation   Dr. Andree Elk, [facility]  CVA 04/17/2010  Depression   Dr. Andree Elk, [place], TYPE II 08/21/2007  DVT, HX OF 08/21/2007  GERD 08/21/2007  GOUT 08/21/2007  History of stroke with residual effects   HYPERLIPIDEMIA 08/21/2007  HYPERTENSION 08/21/2007  Dr. Andree Elk, [place], [place] OF 08/22/2007  s/p renal transplant-Dr. Bonney Leitz  LUPUS 08/21/2007  OSTEOPOROSIS 08/21/2007  Rheumatol at [facility]  Pulmonary embolism ([place]) 07/16/2010  Renal failure   RENAL INSUFFICIENCY 08/21/2007  Right sided weakness   Steroid-induced hyperglycemia 11/09/2014  Tachycardia   THYROID NODULE, LEFT 04/10/2009 Past  Surgical History: Past Surgical History: Procedure Laterality Date  BIOPSY  02/04/2021  Procedure: BIOPSY;  Surgeon: [name], MD;  Location: [facility];  Service: Endoscopy;;  CESAREAN SECTION    CHOLECYSTECTOMY    COLONOSCOPY  06/2000  [facility]   ENTEROSCOPY  N/A 11/11/2014  Procedure: ENTEROSCOPY;  Surgeon: Ladene Artist, MD;  Location: [facility];  Service: Endoscopy;  Laterality: N/A;  FLEXIBLE SIGMOIDOSCOPY N/A 02/04/2021  Procedure: FLEXIBLE SIGMOIDOSCOPY;  Surgeon: [name], MD;  Location: [facility];  Service: Endoscopy;  Laterality: N/A;  KIDNEY TRANSPLANT Right 2009  RENAL BIOPSY, OPEN  1981  TUBAL LIGATION   HPI: Patient is a 61 y.o. 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 She was admitted on 2/26 with fever, hypoxia and found to have aspiration PNA. Intubated 2/26-28. Dx acute hypoxic resp failure likely due to aspiration pna, septic shock (resolved), deconditioning. Pt has had cortrak, has been NPO.  Subjective: alert  Recommendations for follow up therapy are one component of a multi-disciplinary discharge planning process, led by the attending physician.  Recommendations may be updated based on patient status, additional functional criteria and insurance authorization. Assessment / Plan / Recommendation Clinical Impressions 08/11/2021 Clinical Impression Pt presents with a mod-severe oral and mild pharyngeal dysphagia. Oral phase is marked by significant difficulty with initiation of motor sequence for swallowing.  There are subtle movements and tremors noted in tongue - significant time passes before the tongue can propel material into the pharynx. When the swallow was triggered,  thin liquids were aspirated in trace amounts; nectar thick liquids did not penetrate the larynx/ no aspiration.  There was no pharyngeal residue post-swallow.  Recommend initiating a dysphagia 1 diet,  nectar thick liquids for now; crush meds in puree. SLP Visit Diagnosis Dysphagia, oral phase (R13.11) Attention and concentration deficit following -- Frontal lobe and executive function deficit following -- Impact on safety and function Mild aspiration risk   Treatment Recommendations 08/11/2021 Treatment Recommendations Therapy as outlined in treatment plan below   Prognosis 08/11/2021 Prognosis for Safe Diet Advancement Fair Barriers to Reach Goals -- Barriers/Prognosis Comment -- Diet Recommendations 08/11/2021 SLP Diet Recommendations Thin liquid;Dysphagia 1 (Puree) solids Liquid Administration via Cup;Straw Medication Administration Crushed with puree Compensations Minimize environmental distractions Postural Changes --   Other Recommendations 08/11/2021 Recommended Consults -- Oral Care Recommendations Oral care BID Other Recommendations Order thickener from pharmacy Follow Up Recommendations Skilled nursing-short term rehab (<3 hours/day) Assistance recommended at discharge Frequent or constant Supervision/Assistance Functional Status Assessment Patient has had a recent decline in their functional status and demonstrates the ability to make significant improvements in function in a reasonable and predictable amount of time. Frequency and Duration  08/11/2021 Speech Therapy Frequency (ACUTE ONLY) min 2x/week Treatment Duration 2 weeks   Oral Phase 08/11/2021 Oral Phase Impaired Oral - Pudding Teaspoon -- Oral - Pudding Cup -- Oral - Honey Teaspoon -- Oral - Honey Cup -- Oral - Nectar Teaspoon -- Oral - Nectar Cup -- Oral - Nectar Straw Weak lingual manipulation;Reduced posterior propulsion;Holding of bolus;Delayed oral transit Oral - Thin Teaspoon -- Oral - Thin Cup -- Oral - Thin Straw Weak lingual manipulation;Reduced posterior propulsion;Holding of bolus;Delayed oral transit Oral - Puree Weak lingual manipulation;Reduced posterior propulsion;Holding of bolus;Delayed oral transit Oral - Mech Soft -- Oral - Regular --  Oral - Multi-Consistency -- Oral - Pill -- Oral Phase - Comment --  Pharyngeal Phase 08/11/2021 Pharyngeal Phase Impaired Pharyngeal- Pudding Teaspoon -- Pharyngeal -- Pharyngeal- Pudding Cup -- Pharyngeal -- Pharyngeal- Honey Teaspoon -- Pharyngeal -- Pharyngeal- Honey Cup -- Pharyngeal -- Pharyngeal- Nectar Teaspoon -- Pharyngeal -- Pharyngeal- Nectar Cup -- Pharyngeal -- Pharyngeal- Nectar Straw Delayed swallow initiation-pyriform sinuses  Pharyngeal -- Pharyngeal- Thin Teaspoon -- Pharyngeal -- Pharyngeal- Thin Cup -- Pharyngeal -- Pharyngeal- Thin Straw Delayed swallow initiation-pyriform sinuses;Penetration/Aspiration during swallow Pharyngeal Material enters airway, passes BELOW cords and not ejected out despite cough attempt by patient Pharyngeal- Puree Delayed swallow initiation-pyriform sinuses Pharyngeal -- Pharyngeal- Mechanical Soft -- Pharyngeal -- Pharyngeal- Regular -- Pharyngeal -- Pharyngeal- Multi-consistency -- Pharyngeal -- Pharyngeal- Pill -- Pharyngeal -- Pharyngeal Comment --  No flowsheet data found. Juan Quam Laurice 08/11/2021, 2:59 PM                      Cardiac Studies   Echo 08/08/21:  1. Possible takotsubo or stress induced cardiomyopathy.   2. Left ventricular ejection fraction, by estimation, is 35 to 40%. The  left ventricle has moderately decreased function. The left ventricle has  no regional wall motion abnormalities. There is severe left ventricular  hypertrophy. Left ventricular  diastolic parameters are indeterminate.   3. Right ventricular systolic function is normal. The right ventricular  size is normal. There is mildly elevated pulmonary artery systolic  pressure. The estimated right ventricular systolic pressure is 16.1 mmHg.   4. The mitral valve is normal in structure. Mild mitral valve  regurgitation. No evidence of mitral stenosis.   5. Tricuspid valve regurgitation is moderate.   6. The aortic valve is tricuspid. There is mild calcification of the   aortic valve. Aortic valve regurgitation is mild. Aortic valve sclerosis  is present, with no evidence of aortic valve stenosis.   7. The inferior vena cava is normal in size with greater than 50%  respiratory variability, suggesting right atrial pressure of 3 mmHg.   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  Cardiology consulted for possible atrial flutter and chest pain.  Assessment & Plan    # Chest pain:  EKG shows isolated ST elevation in lead V2 with no other reciprocal changes.  She had borderline elevation on prior EKGs 01/2021.  Her chest pain is atypical and pleuritic in nature occurring in the setting of her pneumonia.  She also has chest wall tenderness.  High-sensitivity troponin has been mildly elevated to 102, but continues to downtrend.  LDL was 72.  She was started on rosuvastatin 20.  Will need CMP/lipids in 2-3 months.  She is very frail, SNF resident.  Currently tachycardic and recovering from her aspiration pneumonia.  After family discussion she continues to wish for aggressive care and full code.  Recommend outpatient stress testing when she is more medically stable.  If her tachycardia improves could consider a coronary CTA.    # Sinus tachycardia:  Initially there was concern for atrial flutter.  However she has been found to have sinus tachycardia.  Likely in the setting of her acute illness with pneumonia and septic shock.  She has hypothyroidism and is on levothyroxine.  TSH has been elevated but T3 and free T4 are within normal limits.  She is already on Coumadin for antiphospholipid syndrome.  Continue bisoprolol.  # Recurrent strokes: Continue Coumadin.  Persistent expressive aphasia.  # Acute systolic and diastolic heart failure: LVEF this admission 35 to 40% with global hypokinesis and severe LVH.  Planning for ischemic evaluation as above.  She is euvolemic on exam.   Continue bisoprolol and will add losartan 12.'5mg'$  daily.      For questions or updates, please contact [place] Please consult [email or web address] for contact  info under        Signed, Skeet Latch, MD  08/12/2021, 9:48 AM

## 2021-08-12 NOTE — Progress Notes (Addendum)
ANTICOAGULATION CONSULT NOTE - Follow Up Consult ? ?Pharmacy Consult for Warfarin ?Indication: history of pulmonary embolus and SLE ? ?Allergies  ?Allergen Reactions  ? Oxycodone-Acetaminophen Shortness Of Breath and Nausea Only  ? Propoxyphene Nausea Only and Shortness Of Breath  ?  States takes tylenol at home  ? Sulfonamide Derivatives Shortness Of Breath and Nausea Only  ? Codeine Nausea Only  ? Hydrocodone-Acetaminophen   ?  unknown  ? Hydromorphone   ?  On MAR  ? Other Other (See Comments)  ?  No blood, Jehovaeh Witness   ? Sulfamethoxazole   ? Tape   ?  Redness**PAPER TAPE OK**  ? Gabapentin Anxiety  ?  twitching  ? Latex Rash  ? Metoprolol Rash  ? Morphine And Related Rash  ?  IV site on arm is red, patient reports this is improving.  NO shortness of breath reported. Patient is given dose of Benadryl when taking Morphine   ? Rosiglitazone Rash  ? ? ?Patient Measurements: ?Height: 5' (152.4 cm) ?Weight: 86.6 kg (190 lb 14.7 oz) ?IBW/kg (Calculated) : 45.5 ?Heparin Dosing Weight: 63.7 kg ? ?Vital Signs: ?Temp: 99 ?F (37.2 ?C) (03/08 0735) ?Temp Source: Oral (03/08 0735) ?BP: 123/75 (03/08 0735) ?Pulse Rate: 112 (03/08 0735) ? ?Labs: ?Recent Labs  ?  08/09/21 ?1240 08/10/21 ?0433 08/10/21 ?2202 08/10/21 ?1040 08/11/21 ?0350 08/12/21 ?5427  ?HGB  --   --   --   --   --  7.3*  ?HCT  --   --   --   --   --  23.3*  ?PLT  --   --   --   --   --  201  ?LABPROT  --  23.8*  --   --  21.1* 21.5*  ?INR  --  2.1*  --   --  1.8* 1.9*  ?CREATININE  --  0.91  --   --  0.85 0.91  ?TROPONINIHS 78*  --  56* 47*  --   --   ? ? ?Estimated Creatinine Clearance: 64.2 mL/min (by C-G formula based on SCr of 0.91 mg/dL). ? ?Assessment: ?61 yo female on warfarin PTA for hx PE and SLE. INR supratherapeutic at 5.3 on admission and very labile with IV vitamin K doses. Patient is very malnourished. Labile INR likely due to severe vitamin K deficiency so started on 100 mcg vitamin K PO daily 3/4 with improvement in INR stability.  Pharmacy  consulted for warfarin.  ? ?INR up to 1.9 still slightly subtherapeutic. On enoxaparin bridge. Patient cortrak removed and tube feeds stopped so warfarin absorption will increase with tablets by mouth. Note patient missed vitamin K PO 100 mcg last night, unclear reason.  H/H down trending, no overt bleeding, plt stable.  ? ?Warfarin PTA:  3.'5mg'$  and '4mg'$  alternating days  ? ?Goal of Therapy:  ?INR 2-3 ?Monitor platelets by anticoagulation protocol: Yes ?  ?Plan:  ?Warfarin 4 mg x1 ?Enoxaparin 90 mg Q 12 hr until INR is greater than 2  ?Vitamin K 100 mcg po qday ?Monitor daily INR ?Monitor for signs/symptoms of bleeding  ? ?Benetta Spar, PharmD, BCPS, BCCP ?Clinical Pharmacist ? ?Please check AMION for all [facility] phone numbers ?After 10:00 PM, call [place] [phone] ? ?

## 2021-08-13 ENCOUNTER — Inpatient Hospital Stay (HOSPITAL_COMMUNITY): Payer: Medicare Other

## 2021-08-13 ENCOUNTER — Other Ambulatory Visit: Payer: Self-pay

## 2021-08-13 LAB — BASIC METABOLIC PANEL
Anion gap: 8 (ref 5–15)
BUN: 34 mg/dL — ABNORMAL HIGH (ref 6–20)
CO2: 32 mmol/L (ref 22–32)
Calcium: 8.5 mg/dL — ABNORMAL LOW (ref 8.9–10.3)
Chloride: 106 mmol/L (ref 98–111)
Creatinine, Ser: 0.95 mg/dL (ref 0.44–1.00)
GFR, Estimated: >60 mL/min (ref >60)
Glucose, Bld: 68 mg/dL — ABNORMAL LOW (ref 70–99)
Potassium: 3.6 mmol/L (ref 3.5–5.1)
Sodium: 146 mmol/L — ABNORMAL HIGH (ref 135–145)

## 2021-08-13 LAB — CBC
HCT: 24.6 % — ABNORMAL LOW (ref 36.0–46.0)
Hemoglobin: 7.5 g/dL — ABNORMAL LOW (ref 12.0–15.0)
MCH: 28.6 pg (ref 26.0–34.0)
MCHC: 30.5 g/dL (ref 30.0–36.0)
MCV: 93.9 fL (ref 80.0–100.0)
Platelets: 204 10*3/uL (ref 150–400)
RBC: 2.62 MIL/uL — ABNORMAL LOW (ref 3.87–5.11)
RDW: 17.4 % — ABNORMAL HIGH (ref 11.5–15.5)
WBC: 16.3 10*3/uL — ABNORMAL HIGH (ref 4.0–10.5)
nRBC: 0 % (ref 0.0–0.2)

## 2021-08-13 LAB — GLUCOSE, CAPILLARY
Glucose-Capillary: 92 mg/dL (ref 70–99)
Glucose-Capillary: 106 mg/dL — ABNORMAL HIGH (ref 70–99)
Glucose-Capillary: 121 mg/dL — ABNORMAL HIGH (ref 70–99)
Glucose-Capillary: 148 mg/dL — ABNORMAL HIGH (ref 70–99)
Glucose-Capillary: 140 mg/dL — ABNORMAL HIGH (ref 70–99)

## 2021-08-13 LAB — PROTIME-INR
INR: 1.8 — ABNORMAL HIGH (ref 0.8–1.2)
Prothrombin Time: 21.1 seconds — ABNORMAL HIGH (ref 11.4–15.2)

## 2021-08-13 MED ORDER — IPRATROPIUM BROMIDE 0.02 % IN SOLN
0.5000 mg | Freq: Two times a day (BID) | RESPIRATORY_TRACT | Status: DC
  Administered 2021-08-13 – 2021-08-14 (×2): 0.5 mg via RESPIRATORY_TRACT
  Filled 2021-08-13 (×2): qty 2.5

## 2021-08-13 MED ORDER — INSULIN ASPART 100 UNIT/ML IJ SOLN
0.0000 [IU] | Freq: Three times a day (TID) | INTRAMUSCULAR | Status: DC
  Administered 2021-08-14: 2 [IU] via SUBCUTANEOUS
  Administered 2021-08-14: 1 [IU] via SUBCUTANEOUS

## 2021-08-13 MED ORDER — LOSARTAN POTASSIUM 25 MG PO TABS
12.5000 mg | ORAL_TABLET | Freq: Every day | ORAL | Status: DC
  Administered 2021-08-13 – 2021-08-14 (×2): 12.5 mg via ORAL
  Filled 2021-08-13 (×2): qty 1

## 2021-08-13 MED ORDER — WARFARIN SODIUM 5 MG PO TABS
6.0000 mg | ORAL_TABLET | Freq: Once | ORAL | Status: AC
  Administered 2021-08-13: 15:00:00 6 mg via ORAL
  Filled 2021-08-13: qty 1

## 2021-08-13 MED ORDER — LEVALBUTEROL HCL 0.63 MG/3ML IN NEBU
0.6300 mg | INHALATION_SOLUTION | Freq: Two times a day (BID) | RESPIRATORY_TRACT | Status: DC
  Administered 2021-08-13 – 2021-08-14 (×2): 0.63 mg via RESPIRATORY_TRACT
  Filled 2021-08-13 (×2): qty 3

## 2021-08-13 NOTE — Progress Notes (Signed)
Inpatient Diabetes Program Recommendations ? ?AACE/ADA: New Consensus Statement on Inpatient Glycemic Control (2015) ? ?Target Ranges:  Prepandial:   less than 140 mg/dL ?     Peak postprandial:   less than 180 mg/dL (1-2 hours) ?     Critically ill patients:  140 - 180 mg/dL  ? ?Lab Results  ?Component Value Date  ? GLUCAP 106 (H) 08/13/2021  ? HGBA1C 6.0 (H) 05/27/2021  ? ? ?Review of Glycemic Control ? Latest Reference Range & Units 08/12/21 04:36 08/12/21 07:33 08/12/21 11:39 08/12/21 16:44 08/12/21 22:11 08/13/21 04:45 08/13/21 08:27  ?Glucose-Capillary 70 - 99 mg/dL 120 (H) 128 (H) 194 (H) 178 (H) 159 (H) 92 106 (H)  ? ?Diabetes history: DM 2 ?Outpatient Diabetes medications: Humalog 0-12 units tid with meals ?Current orders for Inpatient glycemic control:  ?Novolog 0-9 units q 4 hours ? ?Inpatient Diabetes Program Recommendations:  ? ?Consider reducing Novolog correction to tid with meals and HS (instead of q 4 hours).   ? ?Thanks,  ?Adah Perl, RN, BC-ADM ?Inpatient Diabetes Coordinator ?Pager [phone]  (8a-5p) ? ? ?

## 2021-08-13 NOTE — Progress Notes (Signed)
Progress Note  Patient Name: Connie Ruiz Date of Encounter: 08/13/2021  [facility] Cardiologist: Quay Burow, MD   Subjective   Expressive aphasia.  Reports throat discomfort.  No SOB.   Inpatient Medications    Scheduled Meds:  bisoprolol  5 mg Oral Daily   chlorhexidine  15 mL Mouth Rinse BID   Chlorhexidine Gluconate Cloth  6 each Topical Daily   enoxaparin (LOVENOX) injection  90 mg Subcutaneous Q12H   glycopyrrolate  0.1 mg Intravenous TID   insulin aspart  0-9 Units Subcutaneous Q4H   levalbuterol  0.63 mg Nebulization BID   And   ipratropium  0.5 mg Nebulization BID   levothyroxine  150 mcg Oral Q0600   mouth rinse  15 mL Mouth Rinse q12n4p   pantoprazole (PROTONIX) IV  40 mg Intravenous Q12H   Phytonadione  100 mcg Oral QPM   predniSONE  5 mg Oral Q breakfast   rosuvastatin  10 mg Oral Daily   sodium chloride flush  3 mL Intravenous Q12H   tacrolimus  1 mg Sublingual BID   warfarin  6 mg Oral ONCE-1600   Warfarin - Pharmacist Dosing Inpatient   Does not apply q1600   Continuous Infusions:  sodium chloride Stopped (08/06/21 1000)   PRN Meds: acetaminophen **OR** acetaminophen, albuterol, docusate, fentaNYL (SUBLIMAZE) injection, lidocaine (PF), nitroGLYCERIN, polyethylene glycol, prochlorperazine   Vital Signs    Vitals:   08/13/21 0326 08/13/21 0830 08/13/21 0836 08/13/21 1111  BP: (!) 105/58 118/68  122/72  Pulse: (!) 101 99  (!) 110  Resp: '17 14  16  '$ Temp: 98.4 F (36.9 C) 99.1 F (37.3 C)  98.6 F (37 C)  TempSrc: Oral Oral  Oral  SpO2: 100% 100% 97% 98%  Weight:      Height:        Intake/Output Summary (Last 24 hours) at 08/13/2021 1150 Last data filed at 08/12/2021 1530 Gross per 24 hour  Intake 300 ml  Output 400 ml  Net -100 ml   Last 3 Weights 08/11/2021 08/10/2021 08/09/2021  Weight (lbs) 190 lb 14.7 oz 189 lb 2.5 oz 186 lb 8.2 oz  Weight (kg) 86.6 kg 85.8 kg 84.6 kg      Telemetry    Sinus rhythm/tachycardia 90s-100s. -  Personally Reviewed  ECG    Sinus tachycardia.  Rate 119 bpm.  ST elevation V1 through V3, most prominent in V2.  Anterolateral T wave inversion. - Personally Reviewed  Physical Exam   VS:  BP 122/72 (BP Location: Left Arm)    Pulse (!) 110    Temp 98.6 F (37 C) (Oral)    Resp 16    Ht 5' (1.524 m)    Wt 86.6 kg    SpO2 98%    BMI 37.29 kg/m  , BMI Body mass index is 37.29 kg/m. GENERAL: Chronically ill-appearing. HEENT: Bilateral proptosis.  Pupils equal round and reactive, fundi not visualized, oral mucosa unremarkable NECK:  No jugular venous distention, waveform within normal limits, carotid upstroke brisk and symmetric, no bruits, no thyromegaly LUNGS: Coarse breath sounds and rhonchi HEART:  RRR.  PMI not displaced or sustained,S1 and S2 within normal limits, no S3, no S4, no clicks, no rubs, no murmurs ABD:  Flat, positive bowel sounds normal in frequency in pitch, no bruits, no rebound, no guarding, no midline pulsatile mass, no hepatomegaly, no splenomegaly EXT:  2 plus pulses throughout, trace edema, no cyanosis no clubbing SKIN:  No rashes no nodules NEURO:  Cranial nerves II through XII grossly intact, motor grossly intact throughout [facility]:  Cognitively intact, oriented to person place and time  Labs    High Sensitivity Troponin:   Recent Labs  Lab 08/09/21 0206 08/09/21 1240 08/10/21 0915 08/10/21 1040  TROPONINIHS 102* 78* 56* 47*     Chemistry Recent Labs  Lab 08/09/21 0206 08/10/21 0433 08/11/21 0350 08/12/21 0547 08/13/21 0123  NA 142 141 140 143 146*  K 3.8 4.0 4.1 3.9 3.6  CL 110 107 103 104 106  CO2 '25 28 30 '$ 32 32  GLUCOSE 266* 219* 238* 123* 68*  BUN 26* 27* 35* 36* 34*  CREATININE 0.98 0.91 0.85 0.91 0.95  CALCIUM 8.0* 8.1* 8.2* 8.4* 8.5*  MG 1.7 1.6* 2.3  --   --   PROT 4.8*  --   --   --   --   ALBUMIN 1.8*  --   --   --   --   AST 10*  --   --   --   --   ALT 9  --   --   --   --   ALKPHOS 35*  --   --   --   --   BILITOT 0.4  --   --    --   --   GFRNONAA >60 >60 >60 >60 >60  ANIONGAP '7 6 7 7 8    '$ Lipids  Recent Labs  Lab 08/10/21 0433  CHOL 143  TRIG 169*  HDL 37*  LDLCALC 72  CHOLHDL 3.9    Hematology Recent Labs  Lab 08/09/21 0206 08/12/21 0547 08/13/21 0123  WBC 14.2* 16.5* 16.3*  RBC 2.86* 2.51* 2.62*  HGB 8.2* 7.3* 7.5*  HCT 25.3* 23.3* 24.6*  MCV 88.5 92.8 93.9  MCH 28.7 29.1 28.6  MCHC 32.4 31.3 30.5  RDW 16.9* 17.5* 17.4*  PLT 198 201 204   Thyroid  Recent Labs  Lab 08/08/21 1832  TSH 8.378*  FREET4 1.04    BNPNo results for input(s): BNP, PROBNP in the last 168 hours.  DDimer No results for input(s): DDIMER in the last 168 hours.   Radiology    DG Chest Port 1 View  Result Date: 08/13/2021 CLINICAL DATA:  Shortness of breath EXAM: PORTABLE CHEST 1 VIEW COMPARISON:  Four days ago FINDINGS: Cardiomegaly and vascular pedicle widening, especially the main pulmonary artery. Right PICC with tip at the upper cavoatrial junction. Hazy density at both lung bases. There could be small volume pleural fluid. No pneumothorax. IMPRESSION: Cardiomegaly, pulmonary artery enlargement, and lower lobe atelectasis or infiltrates. Electronically Signed   By: Jorje Guild M.D.   On: 08/13/2021 06:02   DG Swallowing Func-Speech Pathology  Result Date: 08/11/2021 Table formatting from the original result was not included. Objective Swallowing Evaluation: Type of Study: MBS-Modified Barium Swallow Study  Patient Details Name: CARON ODE MRN: [id number] Date of Birth: 02/10/1961 Today's Date: 08/11/2021 Time: SLP Start Time (ACUTE ONLY): 4098 -SLP Stop Time (ACUTE ONLY): 1400 SLP Time Calculation (min) (ACUTE ONLY): 25 min Past Medical History: Past Medical History: Diagnosis Date  Anxiety   Candida esophagitis ([place]) 11/12/2014  CEREBROVASCULAR ACCIDENT, ACUTE 04/15/2010  CLOSTRIDIUM DIFFICILE COLITIS, HX OF 08/21/2007  CONGESTIVE HEART FAILURE 08/21/2007  Current use of long term anticoagulation   Dr. Andree Elk, [facility]   CVA 04/17/2010  Depression   Dr. Andree Elk, [place], TYPE II 08/21/2007  DVT, HX OF 08/21/2007  GERD 08/21/2007  GOUT 08/21/2007  History of  stroke with residual effects   HYPERLIPIDEMIA 08/21/2007  HYPERTENSION 08/21/2007  Dr. Andree Elk, [place], HX OF 08/22/2007  s/p renal transplant-Dr. Bonney Leitz  LUPUS 08/21/2007  OSTEOPOROSIS 08/21/2007  Rheumatol at [facility]  Pulmonary embolism ([place]) 07/16/2010  Renal failure   RENAL INSUFFICIENCY 08/21/2007  Right sided weakness   Steroid-induced hyperglycemia 11/09/2014  Tachycardia   THYROID NODULE, LEFT 04/10/2009 Past Surgical History: Past Surgical History: Procedure Laterality Date  BIOPSY  02/04/2021  Procedure: BIOPSY;  Surgeon: [name], MD;  Location: [facility];  Service: Endoscopy;;  CESAREAN SECTION    CHOLECYSTECTOMY    COLONOSCOPY  06/2000  [facility]   ENTEROSCOPY N/A 11/11/2014  Procedure: ENTEROSCOPY;  Surgeon: Ladene Artist, MD;  Location: [facility];  Service: Endoscopy;  Laterality: N/A;  FLEXIBLE SIGMOIDOSCOPY N/A 02/04/2021  Procedure: FLEXIBLE SIGMOIDOSCOPY;  Surgeon: [name], MD;  Location: [facility];  Service: Endoscopy;  Laterality: N/A;  KIDNEY TRANSPLANT Right 2009  RENAL BIOPSY, OPEN  1981  TUBAL LIGATION   HPI: Patient is a 61 y.o. 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 She was admitted on 2/26 with fever, hypoxia and found to have aspiration PNA. Intubated 2/26-28. Dx acute hypoxic resp failure likely due to aspiration pna, septic shock (resolved), deconditioning. Pt has had cortrak, has been NPO.  Subjective: alert  Recommendations for follow up therapy are one component of a multi-disciplinary discharge planning process, led by the attending physician.  Recommendations may be updated based on patient status, additional functional criteria and insurance authorization. Assessment  / Plan / Recommendation Clinical Impressions 08/11/2021 Clinical Impression Pt presents with a mod-severe oral and mild pharyngeal dysphagia. Oral phase is marked by significant difficulty with initiation of motor sequence for swallowing.  There are subtle movements and tremors noted in tongue - significant time passes before the tongue can propel material into the pharynx. When the swallow was triggered,  thin liquids were aspirated in trace amounts; nectar thick liquids did not penetrate the larynx/ no aspiration.  There was no pharyngeal residue post-swallow.  Recommend initiating a dysphagia 1 diet, nectar thick liquids for now; crush meds in puree. SLP Visit Diagnosis Dysphagia, oral phase (R13.11) Attention and concentration deficit following -- Frontal lobe and executive function deficit following -- Impact on safety and function Mild aspiration risk   Treatment Recommendations 08/11/2021 Treatment Recommendations Therapy as outlined in treatment plan below   Prognosis 08/11/2021 Prognosis for Safe Diet Advancement Fair Barriers to Reach Goals -- Barriers/Prognosis Comment -- Diet Recommendations 08/11/2021 SLP Diet Recommendations Thin liquid;Dysphagia 1 (Puree) solids Liquid Administration via Cup;Straw Medication Administration Crushed with puree Compensations Minimize environmental distractions Postural Changes --   Other Recommendations 08/11/2021 Recommended Consults -- Oral Care Recommendations Oral care BID Other Recommendations Order thickener from pharmacy Follow Up Recommendations Skilled nursing-short term rehab (<3 hours/day) Assistance recommended at discharge Frequent or constant Supervision/Assistance Functional Status Assessment Patient has had a recent decline in their functional status and demonstrates the ability to make significant improvements in function in a reasonable and predictable amount of time. Frequency and Duration  08/11/2021 Speech Therapy Frequency (ACUTE ONLY) min 2x/week Treatment  Duration 2 weeks   Oral Phase 08/11/2021 Oral Phase Impaired Oral - Pudding Teaspoon -- Oral - Pudding Cup -- Oral - Honey Teaspoon -- Oral - Honey Cup -- Oral - Nectar Teaspoon -- Oral - Nectar Cup -- Oral - Nectar Straw Weak lingual  manipulation;Reduced posterior propulsion;Holding of bolus;Delayed oral transit Oral - Thin Teaspoon -- Oral - Thin Cup -- Oral - Thin Straw Weak lingual manipulation;Reduced posterior propulsion;Holding of bolus;Delayed oral transit Oral - Puree Weak lingual manipulation;Reduced posterior propulsion;Holding of bolus;Delayed oral transit Oral - Mech Soft -- Oral - Regular -- Oral - Multi-Consistency -- Oral - Pill -- Oral Phase - Comment --  Pharyngeal Phase 08/11/2021 Pharyngeal Phase Impaired Pharyngeal- Pudding Teaspoon -- Pharyngeal -- Pharyngeal- Pudding Cup -- Pharyngeal -- Pharyngeal- Honey Teaspoon -- Pharyngeal -- Pharyngeal- Honey Cup -- Pharyngeal -- Pharyngeal- Nectar Teaspoon -- Pharyngeal -- Pharyngeal- Nectar Cup -- Pharyngeal -- Pharyngeal- Nectar Straw Delayed swallow initiation-pyriform sinuses Pharyngeal -- Pharyngeal- Thin Teaspoon -- Pharyngeal -- Pharyngeal- Thin Cup -- Pharyngeal -- Pharyngeal- Thin Straw Delayed swallow initiation-pyriform sinuses;Penetration/Aspiration during swallow Pharyngeal Material enters airway, passes BELOW cords and not ejected out despite cough attempt by patient Pharyngeal- Puree Delayed swallow initiation-pyriform sinuses Pharyngeal -- Pharyngeal- Mechanical Soft -- Pharyngeal -- Pharyngeal- Regular -- Pharyngeal -- Pharyngeal- Multi-consistency -- Pharyngeal -- Pharyngeal- Pill -- Pharyngeal -- Pharyngeal Comment --  No flowsheet data found. Juan Quam Laurice 08/11/2021, 2:59 PM                      Cardiac Studies   Echo 08/08/21:  1. Possible takotsubo or stress induced cardiomyopathy.   2. Left ventricular ejection fraction, by estimation, is 35 to 40%. The  left ventricle has moderately decreased function. The left  ventricle has  no regional wall motion abnormalities. There is severe left ventricular  hypertrophy. Left ventricular  diastolic parameters are indeterminate.   3. Right ventricular systolic function is normal. The right ventricular  size is normal. There is mildly elevated pulmonary artery systolic  pressure. The estimated right ventricular systolic pressure is 95.6 mmHg.   4. The mitral valve is normal in structure. Mild mitral valve  regurgitation. No evidence of mitral stenosis.   5. Tricuspid valve regurgitation is moderate.   6. The aortic valve is tricuspid. There is mild calcification of the  aortic valve. Aortic valve regurgitation is mild. Aortic valve sclerosis  is present, with no evidence of aortic valve stenosis.   7. The inferior vena cava is normal in size with greater than 50%  respiratory variability, suggesting right atrial pressure of 3 mmHg.   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  Cardiology consulted for possible atrial flutter and chest pain.  Assessment & Plan    # Chest pain:  EKG shows isolated ST elevation in lead V2 with no other reciprocal changes.  She had borderline elevation on prior EKGs 01/2021.  Her chest pain is atypical and pleuritic in nature occurring in the setting of her pneumonia.  She also has chest wall tenderness.  High-sensitivity troponin has been mildly elevated to 102, but continues to downtrend.  LDL was 72.  She was started on rosuvastatin 20.  Will need CMP/lipids in 2-3 months.  She is very frail, SNF resident.  Currently tachycardic and recovering from her aspiration pneumonia.  After family discussion she continues to wish for aggressive care and full code.  Recommend outpatient stress testing when she is more medically stable.  If her tachycardia improves could consider a coronary CTA.    # Sinus tachycardia:  Initially there was concern for  atrial flutter.  However she has been found to have sinus tachycardia.  Likely in the  setting of her acute illness with pneumonia and septic shock.  She has hypothyroidism and is on levothyroxine.  TSH has been elevated but T3 and free T4 are within normal limits.  She is already on Coumadin for antiphospholipid syndrome.  Continue bisoprolol.  Heart rates are improving.    # Recurrent strokes: Continue Coumadin.  Persistent expressive aphasia.  # Acute systolic and diastolic heart failure: LVEF this admission 35 to 40% with global hypokinesis and severe LVH.  Planning for ischemic evaluation as above.  She is euvolemic on exam. Na 146 this am.  Agree with stopping lasix.  Continue bisoprolol and will add losartan 12.'5mg'$  daily.      For questions or updates, please contact [place] Please consult [email or web address] for contact info under        Signed, Skeet Latch, MD  08/13/2021, 11:50 AM

## 2021-08-13 NOTE — Progress Notes (Signed)
PROGRESS NOTE        PATIENT DETAILS Name: Connie Ruiz Age: 61 y.o. Sex: female Date of Birth: 1961/05/08 Admit Date: 08/02/2021 Admitting Physician Chesley Mires, MD ZOX:[id number], Malka So, MD  Brief Summary: Patient is a 61 y.o.  female with history of kidney transplant, VTE/antiphospholipid syndrome on chronic Coumadin therapy, CVA with residual aphasia-slight right-sided deficits-resident of SNF who presented to the hospital with acute respiratory distress and fever, she was intubated in the emergency room-and managed in the ICU.  She was subsequently stabilized-extubated on 2/28-unfortunately-she required insertion of NG tube for feedings.  Her hypoxia gradually improved-and patient was transferred to [facility] service on 3/3.  Further hospital course was complicated by development of  volume overload (echo showed new systolic heart failure), severe failure to thrive syndrome with poor oral intake.  See below for further details  Significant Hospital events: 2/26>> admit to ICU-intubated. 2/27>> On vent, remains on Levophed 83mg, Vanc, Cefepime  2/28>> Ext[facility]03[facility]  [facility]>> MBS completed-dysphagia 1 diet started-NG tube removed 2/08>>poor oral intake-calorie count started   Significant imaging studies: 2/26>> CXR: RLL infiltrate. 2/26>> CT head: No acute intracranial findings, chronic left MCA territory infarct. 3/01>> CT head: No acute intracranial abnormalities. 3/01>> RUQ ultrasound: Subtle nodularity of the liver-nonspecific-could reflect early cirrhosis. 3/05>> Echo: EF 35-40%, RVSP 40.7 mmHg.  Significant microbiology data: 2/26>> COVID/influenza PCR: Negative 2/26>> blood culture: Negative 2/27>> urine culture: Negative  Procedures: ETT>>2/26-2/28  Consults:  CCM, nephrology  Subjective: Lying comfortably in bed-no major issues overnight.  Oral intake remains poor per nursing staff.  Objective: Vitals: Blood pressure 122/72,  pulse (!) 110, temperature 98.6 F (37 C), temperature source Oral, resp. rate 16, height 5' (1.524 m), weight 86.6 kg, SpO2 98 %.   Exam: Gen Exam:Alert awake-not in any distress HEENT:atraumatic, normocephalic Chest: B/L clear to auscultation anteriorly-few scattered rhonchi today. CVS:S1S2 regular Abdomen:soft non tender, non distended Extremities:+ edema Neurology: Non focal Skin: no rash   Pertinent Labs/Radiology: CBC Latest Ref Rng & Units 08/13/2021 08/12/2021 08/09/2021  WBC 4.0 - 10.5 K/uL 16.3(H) 16.5(H) 14.2(H)  Hemoglobin 12.0 - 15.0 g/dL 7.5(L) 7.3(L) 8.2(L)  Hematocrit 36.0 - 46.0 % 24.6(L) 23.3(L) 25.3(L)  Platelets 150 - 400 K/uL 204 201 198    Lab Results  Component Value Date   NA 146 (H) 08/13/2021   K 3.6 08/13/2021   CL 106 08/13/2021   CO2 32 08/13/2021      Assessment/Plan: Acute hypoxic respiratory failure likely due to aspiration pneumonia: Hypoxia has improved-completed 7 days of Zosyn.  Cultures negative so far.  Persistent leukocytosis-CXR reviewed today-continues to have mostly RLL infiltrates that appears unchanged since the past few days.  Difficult situation-she will remain at risk for aspiration episodes given frailty/dysarthria/bedbound status-continue Robinul-maintain aspiration precautions.    Septic shock (present on admission): Sepsis physiology resolved-cultures negative so far-completed antibiotic course..Earnestine LeysCKD stage IIIa-history of renal transplant: AKI likely hemodynamically mediated-renal function has improved.  No longer on stress dose steroids-on chronic dosing of prednisone, on tacrolimus.  Neurology has signed off.    Hypernatremia: Mild-stop Lasix-NG tube feeding discontinued on 3/7.  Very poor oral intake continues.  Watch closely.  Failure to thrive syndrome: Very difficult situation-poor oral intake continues-calorie count in progress.  Per patient she does not wish to pursue a PEG tube.  If this trend  continues-we will need  to discuss with family/patient regarding hospice versus reconsidering PEG tube.  Appreciate dietitian input-on maximal supplements.  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  Was on IV Lasix which has now been discontinued as lower extremity edema has improved-and has developed a mild hypernatremia.  Remains on bisoprolol and low-dose losartan.  Unclear whether this is Takotsubo cardiomyopathy or ischemic etiology.  Cardiology plans to pursue ischemic evaluation if/when she is more stable.    Sinus tachycardia: Remains stable-with heart rate in the low 100s range-on bisoprolol.   Likely provoked by acute illness/PNA/physiological stress.    Prolonged QTc: Likely due to hypomagnesemia-QTc much better after replacing magnesium.  Continue telemetry monitoring-avoid QTc prolonging agents.  Atypical chest pain: Continues to have chest pain for the past 2-3 days-on PPI twice daily-troponins negative.  Cardiology following.  Dysphagia: Likely chronic in the setting of prior CVA/dysarthria-worsened due to severe debility from acute illness-did have a NG tube/ cortak tube with feedings-but this was discontinued on 3/7.  She is now tolerating dysphagia 1 diet-but appetite remains very poor-see below.    Hypokalemia/hypophosphatemia: Repleted.  Normocytic anemia: Likely due to critical illness-no evidence of blood loss-follow closely-patient is a [name] and refuses blood products at all costs.  DM-2 (A1c 6.0 on 05/27/2021) with steroid-induced hyperglycemia: CBGs stable-at risk for hypoglycemia-changed to sensitive SSI.  Watch closely.  Has very poor oral intake-not a candidate for aggressive glycemic control.   Recent Labs    08/12/21 2211 08/13/21 0445 08/13/21 0827  GLUCAP 159* 92 106*      HTN: BP stable-continue bisoprolol.  Coreg discontinued due to  bronchospasm.  History of PE-antiphospholipid syndrome: INR slightly subtherapeutic-pharmacy following-on Coumadin and overlapping Lovenox.    History of multiple CVA's with chronic aphasia/right-sided weakness: At baseline-CT head x2 negative for acute abnormalities.  Resident of SNF.  Vascular dementia: We will resume Aricept, Prozac, mirtazapine and amitriptyline over the next few days.  Hypothyroidism: On levothyroxine.  TSH mildly elevated-further optimization of levothyroxine dosing can be done by PCP in the outpatient setting, unclear to me when it was last adjusted.  Refusal of blood transfusions as patient is [name]  Debility/Deconditioning: Chronically frail and weak-Per daughter-patient has been very minimally ambulatory for the past 1 year and is mostly bed to wheelchair bound.  Her last CVA was 8 years ago.    Palliative care: Full code-difficult situation-frail/bedbound-now with significant debility/deconditioning-and severe dysphagia-continues to accumulate secretions.  Long discussion with daughter on 3/4-she is aware of tenuous clinical situation-palliative care following.  Nutrition Status: Nutrition Problem: Moderate Malnutrition Etiology: chronic illness (CHF, CVA) Signs/Symptoms: mild fat depletion, moderate muscle depletion, percent weight loss (16% weight loss in 6 months) Percent weight loss: 16 % (6 months) Interventions: Tube feeding, Prostat   BMI: Estimated body mass index is 37.29 kg/m as calculated from the following:   Height as of this encounter: 5' (1.524 m).   Weight as of this encounter: 86.6 kg.   Code status:   Code Status: Full Code   DVT Prophylaxis: Place and maintain sequential compression device Start: 08/02/21 1242 SCDs Start: 08/02/21 1209 warfarin (COUMADIN) tablet 6 mg    Family Communication: Daughter Anjani Feuerborn  [phone] left voicemail 3/8 and on 3/9.   Disposition Plan: Status is: Inpatient Remains  inpatient appropriate because: Severe failure to thrive syndrome-HFrEF exacerbation-resolving sepsis-poor oral intake-not yet stable for discharge-see above documentation.  Planned Discharge Destination:Skilled nursing facility   Diet: Diet Order             DIET - DYS 1 Room service appropriate? No; Fluid consistency: Nectar Thick  Diet effective now                     Antimicrobial agents: Anti-infectives (From admission, onward)    Start     Dose/Rate Route Frequency Ordered Stop   08/03/21 1500  vancomycin (VANCOREADY) IVPB 750 mg/150 mL  Status:  Discontinued        750 mg 150 mL/hr over 60 Minutes Intravenous Every 24 hours 08/02/21 1456 08/03/21 0912   08/03/21 0630  cefTRIAXone (ROCEPHIN) 2 g in sodium chloride 0.9 % 100 mL IVPB  Status:  Discontinued        2 g 200 mL/hr over 30 Minutes Intravenous Every 24 hours 08/02/21 1209 08/02/21 1246   08/02/21 2100  piperacillin-tazobactam (ZOSYN) IVPB 3.375 g       See Hyperspace for full Linked Orders Report.   3.375 g 12.5 mL/hr over 240 Minutes Intravenous Every 8 hours 08/02/21 1253 08/08/21 2359   08/02/21 1345  vancomycin (VANCOREADY) IVPB 1750 mg/350 mL        1,750 mg 175 mL/hr over 120 Minutes Intravenous  Once 08/02/21 1343 08/02/21 1721   08/02/21 1300  vancomycin (VANCOREADY) IVPB 2000 mg/400 mL  Status:  Discontinued        2,000 mg 200 mL/hr over 120 Minutes Intravenous  Once 08/02/21 1253 08/02/21 1343   08/02/21 1300  piperacillin-tazobactam (ZOSYN) IVPB 3.375 g       See Hyperspace for full Linked Orders Report.   3.375 g 100 mL/hr over 30 Minutes Intravenous  Once 08/02/21 1253 08/02/21 1555   08/02/21 0630  cefTRIAXone (ROCEPHIN) 2 g in sodium chloride 0.9 % 100 mL IVPB        2 g 200 mL/hr over 30 Minutes Intravenous  Once 08/02/21 0618 08/02/21 0807   08/02/21 0630  azithromycin (ZITHROMAX) 500 mg in sodium chloride 0.9 % 250 mL IVPB  Status:  Discontinued        500 mg 250 mL/hr over 60  Minutes Intravenous Every 24 hours 08/02/21 0618 08/02/21 1246        MEDICATIONS: Scheduled Meds:  bisoprolol  5 mg Oral Daily   chlorhexidine  15 mL Mouth Rinse BID   Chlorhexidine Gluconate Cloth  6 each Topical Daily   enoxaparin (LOVENOX) injection  90 mg Subcutaneous Q12H   glycopyrrolate  0.1 mg Intravenous TID   insulin aspart  0-9 Units Subcutaneous Q4H   levalbuterol  0.63 mg Nebulization BID   And   ipratropium  0.5 mg Nebulization BID   levothyroxine  150 mcg Oral Q0600   losartan  12.5 mg Oral Daily   mouth rinse  15 mL Mouth Rinse q12n4p   pantoprazole (PROTONIX) IV  40 mg Intravenous Q12H   Phytonadione  100 mcg Oral QPM   predniSONE  5 mg Oral Q breakfast   rosuvastatin  10 mg Oral Daily   sodium chloride flush  3 mL Intravenous Q12H   tacrolimus  1 mg Sublingual BID   warfarin  6 mg Oral ONCE-1600   Warfarin - Pharmacist Dosing Inpatient   Does not apply q1600   Continuous Infusions:  sodium chloride Stopped (08/06/21 1000)   PRN Meds:.acetaminophen **OR** acetaminophen, albuterol, docusate, fentaNYL (SUBLIMAZE) injection, lidocaine (PF), nitroGLYCERIN, polyethylene glycol, prochlorperazine   I have personally  reviewed following labs and imaging studies  LABORATORY DATA: CBC: Recent Labs  Lab 08/08/21 0420 08/09/21 0046 08/09/21 0206 08/12/21 0547 08/13/21 0123  WBC 12.3* 9.3 14.2* 16.5* 16.3*  HGB 8.5* 5.3* 8.2* 7.3* 7.5*  HCT 26.2* 16.6* 25.3* 23.3* 24.6*  MCV 87.9 91.2 88.5 92.8 93.9  PLT 217 134* 198 201 204     Basic Metabolic Panel: Recent Labs  Lab 08/07/21 0354 08/07/21 1620 08/08/21 0420 08/09/21 0206 08/10/21 0433 08/11/21 0350 08/12/21 0547 08/13/21 0123  NA 142  --  143 142 141 140 143 146*  K 2.8*  --  3.8 3.8 4.0 4.1 3.9 3.6  CL 112*  --  110 110 107 103 104 106  CO2 22  --  '22 25 28 30 '$ 32 32  GLUCOSE 283*  --  279* 266* 219* 238* 123* 68*  BUN 29*  --  28* 26* 27* 35* 36* 34*  CREATININE 1.07*  --  1.00 0.98 0.91  0.85 0.91 0.95  CALCIUM 8.2*  --  8.2* 8.0* 8.1* 8.2* 8.4* 8.5*  MG 2.1 2.0 2.0 1.7 1.6* 2.3  --   --   PHOS 1.9* 2.9 2.4* 4.0 4.1  --   --   --      GFR: Estimated Creatinine Clearance: 61.5 mL/min (by C-G formula based on SCr of 0.95 mg/dL).  Liver Function Tests: Recent Labs  Lab 08/09/21 0206  AST 10*  ALT 9  ALKPHOS 35*  BILITOT 0.4  PROT 4.8*  ALBUMIN 1.8*    No results for input(s): LIPASE, AMYLASE in the last 168 hours. No results for input(s): AMMONIA in the last 168 hours.  Coagulation Profile: Recent Labs  Lab 08/09/21 0206 08/10/21 0433 08/11/21 0350 08/12/21 0547 08/13/21 0123  INR 2.6* 2.1* 1.8* 1.9* 1.8*     Cardiac Enzymes: No results for input(s): CKTOTAL, CKMB, CKMBINDEX, TROPONINI in the last 168 hours.  BNP (last 3 results) No results for input(s): PROBNP in the last 8760 hours.  Lipid Profile: No results for input(s): CHOL, HDL, LDLCALC, TRIG, CHOLHDL, LDLDIRECT in the last 72 hours.   Thyroid Function Tests: No results for input(s): TSH, T4TOTAL, FREET4, T3FREE, THYROIDAB in the last 72 hours.   Anemia Panel: No results for input(s): VITAMINB12, FOLATE, FERRITIN, TIBC, IRON, RETICCTPCT in the last 72 hours.  Urine analysis:    Component Value Date/Time   COLORURINE YELLOW 08/03/2021 0032   APPEARANCEUR CLOUDY (A) 08/03/2021 0032   LABSPEC 1.029 08/03/2021 0032   PHURINE 5.0 08/03/2021 0032   GLUCOSEU NEGATIVE 08/03/2021 0032   GLUCOSEU NEGATIVE 10/22/2013 1621   HGBUR NEGATIVE 08/03/2021 0032   BILIRUBINUR NEGATIVE 08/03/2021 0032   KETONESUR 5 (A) 08/03/2021 0032   PROTEINUR 30 (A) 08/03/2021 0032   UROBILINOGEN 1.0 03/27/2015 1534   NITRITE NEGATIVE 08/03/2021 0032   LEUKOCYTESUR TRACE (A) 08/03/2021 0032    Sepsis Labs: Lactic Acid, Venous    Component Value Date/Time   LATICACIDVEN 0.8 08/02/2021 0511    MICROBIOLOGY: No results found for this or any previous visit (from the past 240 hour(s)).   RADIOLOGY  STUDIES/RESULTS: DG Chest Port 1 View  Result Date: 08/13/2021 CLINICAL DATA:  Shortness of breath EXAM: PORTABLE CHEST 1 VIEW COMPARISON:  Four days ago FINDINGS: Cardiomegaly and vascular pedicle widening, especially the main pulmonary artery. Right PICC with tip at the upper cavoatrial junction. Hazy density at both lung bases. There could be small volume pleural fluid. No pneumothorax. IMPRESSION: Cardiomegaly, pulmonary artery enlargement, and lower lobe  atelectasis or infiltrates. Electronically Signed   By: Jorje Guild M.D.   On: 08/13/2021 06:02   DG Swallowing Func-Speech Pathology  Result Date: 08/11/2021 Table formatting from the original result was not included. Objective Swallowing Evaluation: Type of Study: MBS-Modified Barium Swallow Study  Patient Details Name: KRYSLYN HELBIG MRN: [id number] Date of Birth: 07/10/1960 Today's Date: 08/11/2021 Time: SLP Start Time (ACUTE ONLY): 5784 -SLP Stop Time (ACUTE ONLY): 1400 SLP Time Calculation (min) (ACUTE ONLY): 25 min Past Medical History: Past Medical History: Diagnosis Date  Anxiety   Candida esophagitis ([place]) 11/12/2014  CEREBROVASCULAR ACCIDENT, ACUTE 04/15/2010  CLOSTRIDIUM DIFFICILE COLITIS, HX OF 08/21/2007  CONGESTIVE HEART FAILURE 08/21/2007  Current use of long term anticoagulation   Dr. Andree Elk, [facility]  CVA 04/17/2010  Depression   Dr. Andree Elk, [place], TYPE II 08/21/2007  DVT, HX OF 08/21/2007  GERD 08/21/2007  GOUT 08/21/2007  History of stroke with residual effects   HYPERLIPIDEMIA 08/21/2007  HYPERTENSION 08/21/2007  Dr. Andree Elk, [place], [place] OF 08/22/2007  s/p renal transplant-Dr. Bonney Leitz  LUPUS 08/21/2007  OSTEOPOROSIS 08/21/2007  Rheumatol at [facility]  Pulmonary embolism ([place]) 07/16/2010  Renal failure   RENAL INSUFFICIENCY 08/21/2007  Right sided weakness   Steroid-induced hyperglycemia 11/09/2014  Tachycardia   THYROID NODULE, LEFT 04/10/2009 Past Surgical History: Past Surgical History:  Procedure Laterality Date  BIOPSY  02/04/2021  Procedure: BIOPSY;  Surgeon: [name], MD;  Location: [facility];  Service: Endoscopy;;  CESAREAN SECTION    CHOLECYSTECTOMY    COLONOSCOPY  06/2000  [facility]   ENTEROSCOPY N/A 11/11/2014  Procedure: ENTEROSCOPY;  Surgeon: Ladene Artist, MD;  Location: [facility];  Service: Endoscopy;  Laterality: N/A;  FLEXIBLE SIGMOIDOSCOPY N/A 02/04/2021  Procedure: FLEXIBLE SIGMOIDOSCOPY;  Surgeon: [name], MD;  Location: [facility];  Service: Endoscopy;  Laterality: N/A;  KIDNEY TRANSPLANT Right 2009  RENAL BIOPSY, OPEN  1981  TUBAL LIGATION   HPI: Patient is a 61 y.o. 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 She was admitted on 2/26 with fever, hypoxia and found to have aspiration PNA. Intubated 2/26-28. Dx acute hypoxic resp failure likely due to aspiration pna, septic shock (resolved), deconditioning. Pt has had cortrak, has been NPO.  Subjective: alert  Recommendations for follow up therapy are one component of a multi-disciplinary discharge planning process, led by the attending physician.  Recommendations may be updated based on patient status, additional functional criteria and insurance authorization. Assessment / Plan / Recommendation Clinical Impressions 08/11/2021 Clinical Impression Pt presents with a mod-severe oral and mild pharyngeal dysphagia. Oral phase is marked by significant difficulty with initiation of motor sequence for swallowing.  There are subtle movements and tremors noted in tongue - significant time passes before the tongue can propel material into the pharynx. When the swallow was triggered,  thin liquids were aspirated in trace amounts; nectar thick liquids did not penetrate the larynx/ no aspiration.  There was no pharyngeal residue post-swallow.  Recommend initiating a dysphagia 1 diet, nectar thick liquids for now; crush meds  in puree. SLP Visit Diagnosis Dysphagia, oral phase (R13.11) Attention and concentration deficit following -- Frontal lobe and executive function deficit following -- Impact on safety and function Mild aspiration risk   Treatment Recommendations 08/11/2021 Treatment Recommendations Therapy as outlined in treatment plan below   Prognosis 08/11/2021 Prognosis for Safe Diet Advancement Fair Barriers to Reach Goals --  Barriers/Prognosis Comment -- Diet Recommendations 08/11/2021 SLP Diet Recommendations Thin liquid;Dysphagia 1 (Puree) solids Liquid Administration via Cup;Straw Medication Administration Crushed with puree Compensations Minimize environmental distractions Postural Changes --   Other Recommendations 08/11/2021 Recommended Consults -- Oral Care Recommendations Oral care BID Other Recommendations Order thickener from pharmacy Follow Up Recommendations Skilled nursing-short term rehab (<3 hours/day) Assistance recommended at discharge Frequent or constant Supervision/Assistance Functional Status Assessment Patient has had a recent decline in their functional status and demonstrates the ability to make significant improvements in function in a reasonable and predictable amount of time. Frequency and Duration  08/11/2021 Speech Therapy Frequency (ACUTE ONLY) min 2x/week Treatment Duration 2 weeks   Oral Phase 08/11/2021 Oral Phase Impaired Oral - Pudding Teaspoon -- Oral - Pudding Cup -- Oral - Honey Teaspoon -- Oral - Honey Cup -- Oral - Nectar Teaspoon -- Oral - Nectar Cup -- Oral - Nectar Straw Weak lingual manipulation;Reduced posterior propulsion;Holding of bolus;Delayed oral transit Oral - Thin Teaspoon -- Oral - Thin Cup -- Oral - Thin Straw Weak lingual manipulation;Reduced posterior propulsion;Holding of bolus;Delayed oral transit Oral - Puree Weak lingual manipulation;Reduced posterior propulsion;Holding of bolus;Delayed oral transit Oral - Mech Soft -- Oral - Regular -- Oral - Multi-Consistency -- Oral - Pill --  Oral Phase - Comment --  Pharyngeal Phase 08/11/2021 Pharyngeal Phase Impaired Pharyngeal- Pudding Teaspoon -- Pharyngeal -- Pharyngeal- Pudding Cup -- Pharyngeal -- Pharyngeal- Honey Teaspoon -- Pharyngeal -- Pharyngeal- Honey Cup -- Pharyngeal -- Pharyngeal- Nectar Teaspoon -- Pharyngeal -- Pharyngeal- Nectar Cup -- Pharyngeal -- Pharyngeal- Nectar Straw Delayed swallow initiation-pyriform sinuses Pharyngeal -- Pharyngeal- Thin Teaspoon -- Pharyngeal -- Pharyngeal- Thin Cup -- Pharyngeal -- Pharyngeal- Thin Straw Delayed swallow initiation-pyriform sinuses;Penetration/Aspiration during swallow Pharyngeal Material enters airway, passes BELOW cords and not ejected out despite cough attempt by patient Pharyngeal- Puree Delayed swallow initiation-pyriform sinuses Pharyngeal -- Pharyngeal- Mechanical Soft -- Pharyngeal -- Pharyngeal- Regular -- Pharyngeal -- Pharyngeal- Multi-consistency -- Pharyngeal -- Pharyngeal- Pill -- Pharyngeal -- Pharyngeal Comment --  No flowsheet data found. Juan Quam Laurice 08/11/2021, 2:59 PM                       LOS: 11 days   Oren Binet, MD  [facility]    To contact the attending provider between 7A-7P or the covering provider during after hours 7P-7A, please log into the web site [email or web address] and access using universal [place] password for that web site. If you do not have the password, please call the hospital operator.  08/13/2021, 12:09 PM

## 2021-08-13 NOTE — Progress Notes (Addendum)
ANTICOAGULATION CONSULT NOTE - Follow Up Consult ? ?Pharmacy Consult for Warfarin ?Indication: history of pulmonary embolus and SLE ? ?Allergies  ?Allergen Reactions  ? Oxycodone-Acetaminophen Shortness Of Breath and Nausea Only  ? Propoxyphene Nausea Only and Shortness Of Breath  ?  States takes tylenol at home  ? Sulfonamide Derivatives Shortness Of Breath and Nausea Only  ? Codeine Nausea Only  ? Hydrocodone-Acetaminophen   ?  unknown  ? Hydromorphone   ?  On MAR  ? Other Other (See Comments)  ?  No blood, Jehovaeh Witness   ? Sulfamethoxazole   ? Tape   ?  Redness**PAPER TAPE OK**  ? Gabapentin Anxiety  ?  twitching  ? Latex Rash  ? Metoprolol Rash  ? Morphine And Related Rash  ?  IV site on arm is red, patient reports this is improving.  NO shortness of breath reported. Patient is given dose of Benadryl when taking Morphine   ? Rosiglitazone Rash  ? ? ?Patient Measurements: ?Height: 5' (152.4 cm) ?Weight: 86.6 kg (190 lb 14.7 oz) ?IBW/kg (Calculated) : 45.5 ?Heparin Dosing Weight: 63.7 kg ? ?Vital Signs: ?Temp: 99.1 ?F (37.3 ?C) (03/09 0830) ?Temp Source: Oral (03/09 0830) ?BP: 118/68 (03/09 0830) ?Pulse Rate: 99 (03/09 0830) ? ?Labs: ?Recent Labs  ?  08/10/21 ?8657 08/10/21 ?1040 08/11/21 ?0350 08/12/21 ?8469 08/13/21 ?0123  ?HGB  --   --   --  7.3* 7.5*  ?HCT  --   --   --  23.3* 24.6*  ?PLT  --   --   --  201 204  ?LABPROT  --   --  21.1* 21.5* 21.1*  ?INR  --   --  1.8* 1.9* 1.8*  ?CREATININE  --   --  0.85 0.91 0.95  ?TROPONINIHS 56* 47*  --   --   --   ? ? ? ?Estimated Creatinine Clearance: 61.5 mL/min (by C-G formula based on SCr of 0.95 mg/dL). ? ?Assessment: ?61 yo female on warfarin PTA for hx PE and SLE. INR supratherapeutic at 5.3 on admission and very labile with IV vitamin K doses. Patient is very malnourished. Labile INR likely due to severe vitamin K deficiency so started on 100 mcg vitamin K PO daily 3/4 with improvement in INR stability.  Pharmacy consulted for warfarin.  ? ?INR hovering at  1.8, still subtherapeutic. On enoxaparin bridge but missed last night's dose. Patient is off tube feeds with poor PO intake and missed vitamin K PO 100 mcg x2 days now. Spoke with RN, unclear why patient missed enoxaparin and vitamin K doses. RN will let pharmacist know if patient refusing doses today.  H/H, plt stable.  ? ?Warfarin PTA:  3.'5mg'$  and '4mg'$  alternating days  ? ?Goal of Therapy:  ?INR 2-3 ?Monitor platelets by anticoagulation protocol: Yes ?  ?Plan:  ?Warfarin 6 mg x1 ?Enoxaparin 90 mg Q 12 hr until INR is greater than 2  ?Vitamin K 100 mcg po qday ?Monitor daily INR ?Monitor for signs/symptoms of bleeding  ? ?Benetta Spar, PharmD, BCPS, BCCP ?Clinical Pharmacist ? ?Please check AMION for all [place] phone numbers ?After 10:00 PM, call [place] [phone] ? ?

## 2021-08-13 NOTE — Progress Notes (Signed)
Calorie Count Note ? ?48 hour calorie count ordered. ? ?Diet: dysphagia 1 with nectar thick liquids ?Supplements:  ?Vital Cuisine Shake TID with meals, each supplement provides 520 kcal and 22 grams of protein. ?Mighty Shake TID with meals, each supplement provides 330 kcals and 9 grams of protein. ? ?Lunch (3/8): 141 kcal, 2 gm protein ?Dinner (3/8): 52 kcal, 2 gm protein (10% of a Vital Cuisine Shake, none of the meal) ?Breakfast (3/9): refused to eat ? ?Total intake: ?193 kcal (11% of minimum estimated needs)  ?4 gm protein (4% of minimum estimated needs) ? ?Per discussion with RN, patient refused to eat breakfast today. She is going to assist with and encourage intake of lunch today.  ? ?Nutrition Dx: Moderate Malnutrition related to chronic illness (CHF, CVA) as evidenced by mild fat depletion, moderate muscle depletion, percent weight loss (16% weight loss in 6 months).  ?Ongoing, being addressed via diet advancement and oral nutrition supplements ? ?Goal: Patient will meet greater than or equal to 90% of their estimated nutrition needs.  ? ?Intervention:  ?Calorie count x 24 more hours. ?Continue Vital Cuisine Shake TID, each supplement provides 520 kcal and 22 grams of protein. ?Continue Mighty Shake TID with meals, each supplement provides 330 kcals and 9 grams of protein. ? ? ?Lucas Mallow RD, LDN, CNSC ?Please refer to Amion for contact information.                                                       ? ? ?

## 2021-08-13 NOTE — Care Management Important Message (Signed)
Important Message ? ?Patient Details  ?Name: Connie Ruiz ?MRN: [id number] ?Date of Birth: 06-Nov-1960 ? ? ?Medicare Important Message Given:  Yes ? ? ? ? ?Shelda Altes ?08/13/2021, 9:01 AM ?

## 2021-08-14 ENCOUNTER — Other Ambulatory Visit: Payer: Self-pay | Admitting: Cardiology

## 2021-08-14 DIAGNOSIS — Z531 Procedure and treatment not carried out because of patient's decision for reasons of belief and group pressure: Secondary | ICD-10-CM

## 2021-08-14 DIAGNOSIS — Z515 Encounter for palliative care: Secondary | ICD-10-CM

## 2021-08-14 DIAGNOSIS — I209 Angina pectoris, unspecified: Secondary | ICD-10-CM

## 2021-08-14 DIAGNOSIS — R0682 Tachypnea, not elsewhere classified: Secondary | ICD-10-CM

## 2021-08-14 DIAGNOSIS — F0150 Vascular dementia without behavioral disturbance: Secondary | ICD-10-CM

## 2021-08-14 DIAGNOSIS — I259 Chronic ischemic heart disease, unspecified: Secondary | ICD-10-CM

## 2021-08-14 DIAGNOSIS — R0902 Hypoxemia: Secondary | ICD-10-CM

## 2021-08-14 DIAGNOSIS — Z7189 Other specified counseling: Secondary | ICD-10-CM

## 2021-08-14 LAB — GLUCOSE, CAPILLARY
Glucose-Capillary: 143 mg/dL — ABNORMAL HIGH (ref 70–99)
Glucose-Capillary: 141 mg/dL — ABNORMAL HIGH (ref 70–99)
Glucose-Capillary: 201 mg/dL — ABNORMAL HIGH (ref 70–99)
Glucose-Capillary: 193 mg/dL — ABNORMAL HIGH (ref 70–99)

## 2021-08-14 LAB — PROTIME-INR
INR: 2.1 — ABNORMAL HIGH (ref 0.8–1.2)
Prothrombin Time: 23.9 seconds — ABNORMAL HIGH (ref 11.4–15.2)

## 2021-08-14 LAB — BASIC METABOLIC PANEL
Anion gap: 6 (ref 5–15)
BUN: 28 mg/dL — ABNORMAL HIGH (ref 6–20)
CO2: 32 mmol/L (ref 22–32)
Calcium: 8.1 mg/dL — ABNORMAL LOW (ref 8.9–10.3)
Chloride: 106 mmol/L (ref 98–111)
Creatinine, Ser: 0.98 mg/dL (ref 0.44–1.00)
GFR, Estimated: >60 mL/min (ref >60)
Glucose, Bld: 123 mg/dL — ABNORMAL HIGH (ref 70–99)
Potassium: 3.8 mmol/L (ref 3.5–5.1)
Sodium: 144 mmol/L (ref 135–145)

## 2021-08-14 MED ORDER — NALOXONE HCL 0.4 MG/ML IJ SOLN
INTRAMUSCULAR | Status: AC
  Administered 2021-08-14: 0.4 mg
  Filled 2021-08-14: qty 1

## 2021-08-14 MED ORDER — MAGIC MOUTHWASH W/LIDOCAINE: 15.0000 mL | Freq: Four times a day (QID) | ORAL | 0 refills | Status: DC | PRN

## 2021-08-14 MED ORDER — MAGIC MOUTHWASH W/LIDOCAINE
15.0000 mL | Freq: Four times a day (QID) | ORAL | Status: DC | PRN
  Filled 2021-08-14: qty 15

## 2021-08-14 MED ORDER — LEVALBUTEROL HCL 0.63 MG/3ML IN NEBU: 0.6300 mg | INHALATION_SOLUTION | Freq: Four times a day (QID) | RESPIRATORY_TRACT | Status: DC | PRN

## 2021-08-14 MED ORDER — FENTANYL CITRATE PF 50 MCG/ML IJ SOSY
50.0000 ug | PREFILLED_SYRINGE | INTRAMUSCULAR | Status: DC | PRN
  Administered 2021-08-14: 50 ug via INTRAVENOUS
  Filled 2021-08-14: qty 1

## 2021-08-14 MED ORDER — IPRATROPIUM BROMIDE 0.02 % IN SOLN: 0.5000 mg | Freq: Four times a day (QID) | RESPIRATORY_TRACT | Status: DC | PRN

## 2021-08-14 MED ORDER — WARFARIN SODIUM 5 MG PO TABS
5.0000 mg | ORAL_TABLET | Freq: Once | ORAL | Status: AC
  Administered 2021-08-14: 5 mg via ORAL
  Filled 2021-08-14: qty 1

## 2021-08-14 MED ORDER — NITROGLYCERIN 0.4 MG SL SUBL: 0.4000 mg | SUBLINGUAL_TABLET | SUBLINGUAL | 12 refills | Status: DC | PRN

## 2021-08-14 MED ORDER — GLYCOPYRROLATE 0.2 MG/ML IJ SOLN: 0.1000 mg | Freq: Three times a day (TID) | INTRAMUSCULAR | Status: DC | PRN

## 2021-08-14 MED ORDER — NALOXONE HCL 0.4 MG/ML IJ SOLN: 0.4000 mg | INTRAMUSCULAR | Status: DC | PRN

## 2021-08-14 MED ORDER — FENTANYL CITRATE PF 50 MCG/ML IJ SOSY
100.0000 ug | PREFILLED_SYRINGE | INTRAMUSCULAR | Status: DC | PRN
  Administered 2021-08-14: 100 ug via INTRAVENOUS
  Filled 2021-08-14: qty 2

## 2021-08-14 MED ORDER — FENTANYL CITRATE PF 50 MCG/ML IJ SOSY: 50.0000 ug | PREFILLED_SYRINGE | INTRAMUSCULAR | 0 refills | Status: DC | PRN

## 2021-08-14 MED ORDER — NYSTATIN 100000 UNIT/ML MT SUSP
5.0000 mL | Freq: Four times a day (QID) | OROMUCOSAL | Status: DC
  Administered 2021-08-14: 500000 [IU] via ORAL
  Filled 2021-08-14: qty 5

## 2021-08-14 NOTE — Progress Notes (Signed)
Nutrition Follow-up ? ?Chart reviewed. ?Intake has continued to be minimal. ?Plans for transfer to [facility] for Hospice care. ?No further nutrition interventions indicated at this time.  ? ?Lucas Mallow RD, LDN, CNSC ?Please refer to Amion for contact information.                                                       ? ?

## 2021-08-14 NOTE — Progress Notes (Signed)
? ?  Pt has been scheduled for myoviwe 3/15 andf ollow up with cards 3/23 --date and times on discharge papers ? ?Cecilie Kicks, FNP-C ?At [place]  ?[phone] or after 5pm and on weekends call [phone] ?08/14/2021.now   ?

## 2021-08-14 NOTE — Progress Notes (Addendum)
Manufacturing engineer [facility]) Hospital Liaison Note ? ?Referral received from [facility] for patient/family interest in [facility]. [place] liaison spoke with patient's daughter [name]. Interest confirmed. Chart under review by [facility] physician.  ? ?Hospice eligibility confirmed.  ? ?Plan is to transfer [facility]. Unit RN please call report to [phone] prior to patient leaving the unit. Please send signed DNR and paperwork with patient.  ? ?Please call with any questions or concerns. Thank you ? ?Roselee Nova, LCSW ?[facility] Liaison ?[phone] ? ?

## 2021-08-14 NOTE — Progress Notes (Signed)
Event: 166mg fentanyl order for pts unrelieved pain in chest and neck. Pt VSS and received dose. Pt RR dropped to 5 and O2 dropped to 80% on 2LNC. MD notified and ordered narcan. Pt received 1 dose and O2 increased to 4LNC. Reassessment: pt RR increased to 18 breaths per minute and O2 sats '@98'$ %. Will continue to monitor. ? ?ARaelyn Number RN ? ?

## 2021-08-14 NOTE — Progress Notes (Signed)
Pt d/c via [facility]. Pt's daughter Ishia Tenorio notified.  ?

## 2021-08-14 NOTE — Progress Notes (Signed)
Pt agreed to take AM meds crushed in applesauce, only 3 tsp. Pt refused all of breakfast outside of 4 sips of protein shake.  ? ?Raelyn Number, RN ? ?

## 2021-08-14 NOTE — Progress Notes (Signed)
[facility] will sign off.   ?Medication Recommendations:  Continue current medications  ?Other recommendations (labs, testing, etc):  Outpatient Lexiscan Myoview  ?Follow up as an outpatient:  we will arrange ? ?Rane Dumm C. Oval Linsey, MD, [facility] ?08/14/2021 ?10:51 AM ? ? ?

## 2021-08-14 NOTE — TOC Transition Note (Addendum)
Transition of Care (TOC) - CM/SW Discharge Note ? ? ?Patient Details  ?Name: Connie Ruiz ?MRN: [id number] ?Date of Birth: 08/02/1960 ? ?Transition of Care (TOC) CM/SW Contact:  ?Vinie Sill, LCSW ?Phone Number: ?08/14/2021, 4:47 PM ? ? ?Clinical Narrative:    ? ?Patient will Discharge to: [facility] -if daughter completed paperwork- [place] will notify RN by secure chat  ? ?Discharge Date:08/14/2021 ?Family Notified: ?Transport IW:LNLG ? ?Per MD patient is ready for discharge. RN, patient, and facility notified of discharge. Discharge Summary sent to facility. Unit RN please call report to [phone] prior to patient leaving the unit. Ambulance transport requested for patient by RN once patient is ready for discharge # [phone]. ? ?Clinical Social Worker signing off. ? ?Thurmond Butts, MSW, LCSW ?Clinical Social Worker ? ? ? ?  ?Barriers to Discharge: Hospice Bed not available ? ? ?Patient Goals and CMS Choice ?Patient states their goals for this hospitalization and ongoing recovery are:: comfort care- no feeding tubes ?CMS Medicare.gov Compare Post Acute Care list provided to:: Patient ?Choice offered to / list presented to : Patient, Adult Children ? ?Discharge Placement ?  ?           ?  ?  ?  ?  ? ?Discharge Plan and Services ?In-house Referral: Clinical Social Work ?Discharge Planning Services: CM Consult ?Post Acute Care Choice: Hospice          ?  ?  ?  ?  ?  ?  ?  ?  ?  ?  ? ?Social Determinants of Health (SDOH) Interventions ?  ? ? ?Readmission Risk Interventions ?Readmission Risk Prevention Plan 05/29/2021  ?Transportation Screening Complete  ?Medication Review Press photographer) Complete  ?PCP or Specialist appointment within 3-5 days of discharge Complete  ?[place] or Home Care Consult Complete  ?SW Recovery Care/Counseling Consult Complete  ?Palliative Care Screening Not Applicable  ?Skilled Nursing Facility Complete  ?Some recent data might be hidden  ? ? ? ? ? ?

## 2021-08-14 NOTE — Progress Notes (Signed)
Pt to be d/c from [facility] to [facility] via [place]. Discharge information placed on chart for transportation. PICC and flexiseal remaining in place for transport to facility. Report called and given to nurse Otila Kluver.  ? ?Raelyn Number, RN ? ?

## 2021-08-14 NOTE — Progress Notes (Addendum)
?Daily Progress Note  ? ?Patient Name: Connie Ruiz       Date: 08/14/2021 ?DOB: Dec 31, 1960  Age: 61 y.o. MRN#: [id number] ?Attending Physician: Jonetta Osgood, MD ?Primary Care Physician: [name], [name], MD ?Admit Date: 08/02/2021 ?Length of Stay: 12 days ? ?Reason for Consultation/Follow-up: Establishing goals of care ? ?HPI/Patient Profile:  61 y.o. female  with past medical history of antiphospholipid syndrome (Coumadin), multiple CVAs, hypthyroidism (Synthroid), vascular dementia (Aricept), kidney transplant, and lupus admitted on 08/02/2021 with acute respiratory distress, fever, and suspected aspiration PNA. Pt was intubated and successfully extubated during this hospitalization. CoreTrak had been placed, patient passed MBS with Dysphagia 1 recommendation and tube was d/c'd. However, continued poor intake (first 24 hours of 48 hour calorie count was 193 kcal. Patient has declined PEG tube. ? ?PMT was consulted for [place]. ? ?Subjective:  ? ?Subjective: ?Chart Reviewed. Updates received. Patient Assessed. Created space and opportunity for patient  and family to explore thoughts and feelings regarding current medical situation. ? ?Today's Discussion: Today I met with the patient at the bedside, although she is generally aphasic with minimal communication ability.  Also the bedside was her daughter Connie Ruiz and her sister-in-law.  I was joined at the bedside meeting by Dr. Oren Binet. Dr. Sloan Leiter provided an update on her clinical status.  He discussed her chronic underlying comorbidities, acute issues including dysphagia with aspiration pneumonia.  He discussed tentative respiratory status and likely recurrent pneumonia risk.  He discussed that feeding tube is not typically offered in this situation because it does not have significant benefit for quality or longevity of life and merely "prolong suffering."  He also discussed CODE STATUS and recommended DNR.  The family verbalized understanding.  At this  point her daughter became very emotional and began crying.  I provided comfort and emotional support by therapeutic touch, empathy. ? ?After Dr. Sloan Leiter left, we can continue discussions.  I reemphasized that PEG tube is not indicated in this situation.  Additionally, when asked the patient if she would want a feeding tube she shook her head no and said "no".  We discussed her first 24 hours of her 48-hour calorie count which she only consumed between 150 and 200 cal.  We expressed that this is not enough to sustain life and, without a feeding tube, that this would limit her time. ? ?We had a discussion about the hospice philosophy and shifting focus from "trying to cure the likely incurable" 2 comfort, dignity, and quality.  We discussed the possible options for hospice including home hospice, home hospice in a long-term care facility, or residential hospice.  The patient's sister-in-law recently went through a home hospice situation with her mother and discussed how difficult it is to be a caregiver.  I agreed and offered condolences and support.  After discussion of the pros and cons I recommended residential hospice facility.  I feel she would qualify because of her very poor oral intake.  The patient's family agreed to a consult with Manufacturing engineer for [employer] evaluation.  I discussed there are other options if [facility] does not feel she meets criteria. ? ?I discussed that at times there can be a wait for a bed, sometimes several days.  I discussed that if it would be longer than a day for bed at residential hospice then we can discuss making the patient comfort care in the hospital in the interim as we await placement.  They verbalized understanding. ? ?I provided emotional and  general support through therapeutic listening, empathy, sharing stories, therapeutic touch, and other techniques.  I answered all questions and addressed all concerns to the best of my ability.  However, when I left the  room the patient's daughter was openly sobbing and very emotional, as is understandable. ? ?ROS limited due to aphasia ?Review of Systems  ?Constitutional:   ?     Denies pain in general  ?Respiratory:  Negative for shortness of breath.   ? ?Objective:  ? ?Vital Signs:  ?BP 109/69 (BP Location: Left Arm)   Pulse 100   Temp 99.4 ?F (37.4 ?C) (Oral)   Resp 16   Ht 5' (1.524 m)   Wt 88.9 kg   SpO2 98%   BMI 38.28 kg/m?  ? ?Physical Exam: ?Physical Exam ?Vitals and nursing note reviewed.  ?Constitutional:   ?   General: She is not in acute distress. ?   Appearance: She is ill-appearing.  ?HENT:  ?   Head: Normocephalic and atraumatic.  ?Cardiovascular:  ?   Rate and Rhythm: Tachycardia present.  ?Pulmonary:  ?   Effort: Pulmonary effort is normal. No respiratory distress.  ?Skin: ?   General: Skin is warm and dry.  ?Neurological:  ?   Mental Status: She is alert.  ? ? ?Palliative Assessment/Data: 20% ? ? ?Assessment & Plan:  ? ?Impression: ?Present on Admission: ? Sepsis due to pneumonia [facility]) ? Acute hypoxemic respiratory failure ([place]) ? Vascular dementia ([place]) ? Supratherapeutic INR ? Essential hypertension ? Chronic kidney disease, stage 3a ([place]) ? ?61 year old female with multiple underlying chronic comorbidities now with acute illness.  Major limiting issue is failure to thrive and poor oral intake (193 kcal over the past 24 hours).  The patient does not want a feeding tube and generally felt feeding tube and not be beneficial for her.  High risk for respiratory decompensation, high risk for recurrent aspiration.  After discussion family seems to be in agreement with DNR status.  Also in agreement for consult for residential hospice with Manufacturing engineer.  Daughter remains quite emotional, provided ongoing emotional support.  Overall prognosis is quite poor. ? ?SUMMARY OF RECOMMENDATIONS   ?Change code status to DNR ?Consult TOC for residential hospice evaluation ([place] first choice) ?Discussed  case with hospice liaison ?Continued emotional support of patient and family ?Continue current treatments for now ?If anticipated greater than 1 day wait for bed then consider transition to comfort care in hospital ?PMT will continue to follow ? ?Symptom Management:  ?Per primary team ?PMT is available to assist as needed ?PMT can take over symptom management if/when patient becomes comfort care ? ?Code Status: DNR ? ?Prognosis: < 2 weeks ? ?Discharge Planning:  Residential hospice consult placed ? ?Discussed with: Medical team, nursing team, patient, patient's family, TOC team, hospice liaison ? ?Thank you for allowing [place] to participate in the care of Connie Ruiz ?PMT will continue to support holistically. ? ?Time Total: 90 min ? ?Visit consisted of counseling and education dealing with the complex and emotionally intense issues of symptom management and palliative care in the setting of serious and potentially life-threatening illness. Greater than 50%  of this time was spent counseling and coordinating care related to the above assessment and plan. ? ?Walden Field, NP ?Palliative Medicine Team ? ?Team Phone # [phone] (Nights/Weekends) ? 02/03/2021, 8:17 AM  ? ?

## 2021-08-14 NOTE — Progress Notes (Addendum)
Pt independently drank 152ms thickened cranberry juice- was unable to use straw, did sips. No coughing or choking. She refused further PO intake and am levothyroxine. ?

## 2021-08-14 NOTE — Progress Notes (Addendum)
PROGRESS NOTE        PATIENT DETAILS Name: Connie Ruiz Age: 61 y.o. Sex: female Date of Birth: 1961/02/02 Admit Date: 08/02/2021 Admitting Physician Chesley Mires, MD [id number], Malka So, MD  Brief Summary: Patient is a 61 y.o.  female with history of kidney transplant, VTE/antiphospholipid syndrome on chronic Coumadin therapy, CVA with residual aphasia-slight right-sided deficits-resident of SNF who presented to the hospital with acute respiratory distress and fever, she was intubated in the emergency room-and managed in the ICU.  She was subsequently stabilized-extubated on 2/28-unfortunately-she required insertion of NG tube for feedings.  Her hypoxia gradually improved-and patient was transferred to [facility] service on 3/3.  Further hospital course was complicated by development of  volume overload (echo showed new systolic heart failure), severe failure to thrive syndrome with poor oral intake.  See below for further details  Significant Hospital events: 2/26>> admit to ICU-intubated. 2/27>> On vent, remains on Levophed 25mg, Vanc, Cefepime  2/28>> Extubated 3/03>> [facility]  2/[facility]>> MBS completed-dysphagia 1 diet started-NG tube removed 2/08>>poor oral intake-calorie count started   Significant imaging studies: 2/26>> CXR: RLL infiltrate. 2/26>> CT head: No acute intracranial findings, chronic left MCA territory infarct. 3/01>> CT head: No acute intracranial abnormalities. 3/01>> RUQ ultrasound: Subtle nodularity of the liver-nonspecific-could reflect early cirrhosis. 3/05>> Echo: EF 35-40%, RVSP 40.7 mmHg.  Significant microbiology data: 2/26>> COVID/influenza PCR: Negative 2/26>> blood culture: Negative 2/27>> urine culture: Negative  Procedures: ETT>>2/26-2/28  Consults:  CCM, nephrology  Subjective: Hardly any oral intake-continues to have congested/transmitted upper airway sounds.  Objective: Vitals: Blood pressure 109/69, pulse 100,  temperature 99.4 F (37.4 C), temperature source Oral, resp. rate 16, height 5' (1.524 m), weight 88.9 kg, SpO2 98 %.   Exam: Gen Exam:Alert awake-not in any distress HEENT:atraumatic, normocephalic Chest: Transmitted upper airway sounds. CVS:S1S2 regular Abdomen:soft non tender, non distended Extremities:no edema Neurology: Non focal Skin: no rash   Pertinent Labs/Radiology: CBC Latest Ref Rng & Units 08/13/2021 08/12/2021 08/09/2021  WBC 4.0 - 10.5 K/uL 16.3(H) 16.5(H) 14.2(H)  Hemoglobin 12.0 - 15.0 g/dL 7.5(L) 7.3(L) 8.2(L)  Hematocrit 36.0 - 46.0 % 24.6(L) 23.3(L) 25.3(L)  Platelets 150 - 400 K/uL 204 201 198    Lab Results  Component Value Date   NA 144 08/14/2021   K 3.8 08/14/2021   CL 106 08/14/2021   CO2 32 08/14/2021      Assessment/Plan: Acute hypoxic respiratory failure likely due to aspiration pneumonia: Hypoxia has improved-remains on 1-2 L of oxygen-has completed 7 days of Rocephin.  All cultures negative to date.  CXR continues to show persistent RLL infiltrates.  She continues to exhibit signs/symptoms of intermittent aspiration-has transmitted upper airway sounds-continues to accumulate secretions intermittently.  Remains on Robinul.  Family aware of tenuous clinical situation-given the fact that she is so weak and debilitated (bedbound status at baseline) she remains at significant risk for recurrent aspiration episodes.  Continue to maintain aspiration precautions.  Long discussion with daughter and sister-in-law at bedside-see below  Septic shock (present on admission): Sepsis physiology resolved-cultures negative so far-completed antibiotic course  AKI-on CKD stage IIIa-history of renal transplant: AKI likely hemodynamically mediated-renal function has improved.  No longer on stress dose steroids-on chronic dosing of prednisone, on tacrolimus.  Neurology has signed off.    Hypernatremia: Resolved-due to poor oral intake and use of diuretics.  Failure to  thrive  syndrome: Although clinically improved from hypoxia/PNA-she has developed severe failure to thrive syndrome.  Oral intake remains very poor-in fact minimal.  NG tube was discontinued several days ago, patient does not desire feeding tube.  Extensive discussion with family today-really no good options-with her having very minimal oral intake-family aware that this is incompatible with life.  After extensive discussion with daughter/sister-in-law by this MD and palliative care NP-Eric Gill-in the patient's room today-family leaning towards transitioning to comfort measures.  Ongoing discussions with with family by the palliative care team.  Will await further recommendations.  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  No longer on furosemide as oral intake is so poor-and at risk for AKI/hyponatremia.  On bisoprolol and losartan.   Unclear whether this is Takotsubo cardiomyopathy or ischemic etiology.  Cardiology had plan to pursue ischemic evaluation as an outpatient-but do not feel that this is necessary as plans are to transition to comfort measures.    Sinus tachycardia: Remains stable-with heart rate in the low 100s range-on bisoprolol.   Likely provoked by acute illness/PNA/physiological stress.    Prolonged QTc: Likely due to hypomagnesemia-QTc much better after replacing magnesium.  Continue telemetry monitoring-avoid QTc prolonging agents.  Atypical chest pain: Continues to have chest pain-will use as needed nitroglycerin and fentanyl for comfort.   Dysphagia: Likely chronic in the setting of prior CVA/dysarthria-worsened due to severe debility from acute illness-did have a NG tube/ cortak tube with feedings-but this was discontinued on 3/7.  She is now tolerating dysphagia 1 diet-but appetite remains very poor  Hypokalemia/hypophosphatemia:  Repleted.  Normocytic anemia: Likely due to critical illness-no evidence of blood loss-follow closely-patient is a [name] and refuses blood products at all costs.  DM-2 (A1c 6.0 on 05/27/2021) with steroid-induced hyperglycemia: CBGs stable-at risk for hypoglycemia-changed to sensitive SSI.  Watch closely.  Has very poor oral intake-not a candidate for aggressive glycemic control.   Recent Labs    08/13/21 2019 08/14/21 0625 08/14/21 0748  GLUCAP 140* 143* 141*      HTN: BP stable-continue bisoprolol.  Coreg discontinued due to bronchospasm.  History of PE-antiphospholipid syndrome: INR slightly subtherapeutic-pharmacy following-on Coumadin and overlapping Lovenox.    History of multiple CVA's with chronic aphasia/right-sided weakness: At baseline-CT head x2 negative for acute abnormalities.  Resident of SNF.  Vascular dementia: We will resume Aricept, Prozac, mirtazapine and amitriptyline over the next few days.  Hypothyroidism: On levothyroxine.  TSH mildly elevated-further optimization of levothyroxine dosing can be done by PCP in the outpatient setting, unclear to me when it was last adjusted.  Refusal of blood transfusions as patient is [name]  Debility/Deconditioning: Chronically frail and weak-Per daughter-patient has been very minimally ambulatory for the past 1 year and is mostly bed to wheelchair bound.  Her last CVA was 8 years ago.    Palliative care: Long discussion with family (daughter and sister-in-law at bedside)-by this MD and palliative care NP.  Explained that patient has been relatively stable for the past several days but oral intake is very poor-and is incompatible with life.  Patient does not desire feeding tube.  Recommended that it may be prudent to consider hospice measures.  Palliative care discussion continues by the palliative care team.  We will await further recommendations.  Addendum: Discussed with palliative care  team-DNR-looking at residential hospice on discharge.  Nutrition Status: Nutrition Problem: Moderate  Malnutrition Etiology: chronic illness (CHF, CVA) Signs/Symptoms: mild fat depletion, moderate muscle depletion, percent weight loss (16% weight loss in 6 months) Percent weight loss: 16 % (6 months) Interventions: Tube feeding, Prostat   BMI: Estimated body mass index is 38.28 kg/m as calculated from the following:   Height as of this encounter: 5' (1.524 m).   Weight as of this encounter: 88.9 kg.   Code status:   Code Status: Full Code   DVT Prophylaxis: Place and maintain sequential compression device Start: 08/02/21 1242 SCDs Start: 08/02/21 1209 warfarin (COUMADIN) tablet 5 mg    Family Communication: Daughter Chandni Gagan  [phone] bedside.   Disposition Plan: Status is: Inpatient Remains inpatient appropriate because: Severe failure to thrive syndrome-HFrEF exacerbation-resolving sepsis-poor oral intake-not yet stable for discharge-see above documentation.    Planned Discharge Destination:Skilled nursing facility   Diet: Diet Order             DIET - DYS 1 Room service appropriate? No; Fluid consistency: Nectar Thick  Diet effective now                     Antimicrobial agents: Anti-infectives (From admission, onward)    Start     Dose/Rate Route Frequency Ordered Stop   08/03/21 1500  vancomycin (VANCOREADY) IVPB 750 mg/150 mL  Status:  Discontinued        750 mg 150 mL/hr over 60 Minutes Intravenous Every 24 hours 08/02/21 1456 08/03/21 0912   08/03/21 0630  cefTRIAXone (ROCEPHIN) 2 g in sodium chloride 0.9 % 100 mL IVPB  Status:  Discontinued        2 g 200 mL/hr over 30 Minutes Intravenous Every 24 hours 08/02/21 1209 08/02/21 1246   08/02/21 2100  piperacillin-tazobactam (ZOSYN) IVPB 3.375 g       See Hyperspace for full Linked Orders Report.   3.375 g 12.5 mL/hr over 240 Minutes Intravenous Every 8 hours 08/02/21 1253 08/08/21 2359    08/02/21 1345  vancomycin (VANCOREADY) IVPB 1750 mg/350 mL        1,750 mg 175 mL/hr over 120 Minutes Intravenous  Once 08/02/21 1343 08/02/21 1721   08/02/21 1300  vancomycin (VANCOREADY) IVPB 2000 mg/400 mL  Status:  Discontinued        2,000 mg 200 mL/hr over 120 Minutes Intravenous  Once 08/02/21 1253 08/02/21 1343   08/02/21 1300  piperacillin-tazobactam (ZOSYN) IVPB 3.375 g       See Hyperspace for full Linked Orders Report.   3.375 g 100 mL/hr over 30 Minutes Intravenous  Once 08/02/21 1253 08/02/21 1555   08/02/21 0630  cefTRIAXone (ROCEPHIN) 2 g in sodium chloride 0.9 % 100 mL IVPB        2 g 200 mL/hr over 30 Minutes Intravenous  Once 08/02/21 0618 08/02/21 0807   08/02/21 0630  azithromycin (ZITHROMAX) 500 mg in sodium chloride 0.9 % 250 mL IVPB  Status:  Discontinued        500 mg 250 mL/hr over 60 Minutes Intravenous Every 24 hours 08/02/21 0618 08/02/21 1246        MEDICATIONS: Scheduled Meds:  bisoprolol  5 mg Oral Daily   chlorhexidine  15 mL Mouth Rinse BID   Chlorhexidine Gluconate Cloth  6 each Topical Daily   glycopyrrolate  0.1 mg Intravenous TID   insulin aspart  0-6 Units Subcutaneous TID WC   levalbuterol  0.63 mg Nebulization BID   And   ipratropium  0.5 mg Nebulization BID  levothyroxine  150 mcg Oral Q0600   losartan  12.5 mg Oral Daily   mouth rinse  15 mL Mouth Rinse q12n4p   pantoprazole (PROTONIX) IV  40 mg Intravenous Q12H   Phytonadione  100 mcg Oral QPM   predniSONE  5 mg Oral Q breakfast   rosuvastatin  10 mg Oral Daily   sodium chloride flush  3 mL Intravenous Q12H   tacrolimus  1 mg Sublingual BID   warfarin  5 mg Oral ONCE-1600   Warfarin - Pharmacist Dosing Inpatient   Does not apply q1600   Continuous Infusions:  sodium chloride 10 mL/hr at 08/13/21 2306   PRN Meds:.acetaminophen **OR** acetaminophen, albuterol, docusate, fentaNYL (SUBLIMAZE) injection, lidocaine (PF), nitroGLYCERIN, polyethylene glycol, prochlorperazine   I  have personally reviewed following labs and imaging studies  LABORATORY DATA: CBC: Recent Labs  Lab 08/08/21 0420 08/09/21 0046 08/09/21 0206 08/12/21 0547 08/13/21 0123  WBC 12.3* 9.3 14.2* 16.5* 16.3*  HGB 8.5* 5.3* 8.2* 7.3* 7.5*  HCT 26.2* 16.6* 25.3* 23.3* 24.6*  MCV 87.9 91.2 88.5 92.8 93.9  PLT 217 134* 198 201 204     Basic Metabolic Panel: Recent Labs  Lab 08/07/21 1620 08/08/21 0420 08/08/21 0420 08/09/21 0206 08/10/21 0433 08/11/21 0350 08/12/21 0547 08/13/21 0123 08/14/21 0425  NA  --  143   < > 142 141 140 143 146* 144  K  --  3.8   < > 3.8 4.0 4.1 3.9 3.6 3.8  CL  --  110   < > 110 107 103 104 106 106  CO2  --  22   < > '25 28 30 '$ 32 32 32  GLUCOSE  --  279*   < > 266* 219* 238* 123* 68* 123*  BUN  --  28*   < > 26* 27* 35* 36* 34* 28*  CREATININE  --  1.00   < > 0.98 0.91 0.85 0.91 0.95 0.98  CALCIUM  --  8.2*   < > 8.0* 8.1* 8.2* 8.4* 8.5* 8.1*  MG 2.0 2.0  --  1.7 1.6* 2.3  --   --   --   PHOS 2.9 2.4*  --  4.0 4.1  --   --   --   --    < > = values in this interval not displayed.     GFR: Estimated Creatinine Clearance: 60.6 mL/min (by C-G formula based on SCr of 0.98 mg/dL).  Liver Function Tests: Recent Labs  Lab 08/09/21 0206  AST 10*  ALT 9  ALKPHOS 35*  BILITOT 0.4  PROT 4.8*  ALBUMIN 1.8*    No results for input(s): LIPASE, AMYLASE in the last 168 hours. No results for input(s): AMMONIA in the last 168 hours.  Coagulation Profile: Recent Labs  Lab 08/10/21 0433 08/11/21 0350 08/12/21 0547 08/13/21 0123 08/14/21 0425  INR 2.1* 1.8* 1.9* 1.8* 2.1*     Cardiac Enzymes: No results for input(s): CKTOTAL, CKMB, CKMBINDEX, TROPONINI in the last 168 hours.  BNP (last 3 results) No results for input(s): PROBNP in the last 8760 hours.  Lipid Profile: No results for input(s): CHOL, HDL, LDLCALC, TRIG, CHOLHDL, LDLDIRECT in the last 72 hours.   Thyroid Function Tests: No results for input(s): TSH, T4TOTAL, FREET4, T3FREE,  THYROIDAB in the last 72 hours.   Anemia Panel: No results for input(s): VITAMINB12, FOLATE, FERRITIN, TIBC, IRON, RETICCTPCT in the last 72 hours.  Urine analysis:    Component Value Date/Time   COLORURINE YELLOW 08/03/2021  0032   APPEARANCEUR CLOUDY (A) 08/03/2021 0032   LABSPEC 1.029 08/03/2021 0032   PHURINE 5.0 08/03/2021 0032   GLUCOSEU NEGATIVE 08/03/2021 0032   GLUCOSEU NEGATIVE 10/22/2013 1621   HGBUR NEGATIVE 08/03/2021 0032   BILIRUBINUR NEGATIVE 08/03/2021 0032   KETONESUR 5 (A) 08/03/2021 0032   PROTEINUR 30 (A) 08/03/2021 0032   UROBILINOGEN 1.0 03/27/2015 1534   NITRITE NEGATIVE 08/03/2021 0032   LEUKOCYTESUR TRACE (A) 08/03/2021 0032    Sepsis Labs: Lactic Acid, Venous    Component Value Date/Time   LATICACIDVEN 0.8 08/02/2021 0511    MICROBIOLOGY: No results found for this or any previous visit (from the past 240 hour(s)).   RADIOLOGY STUDIES/RESULTS: DG Chest Port 1 View  Result Date: 08/13/2021 CLINICAL DATA:  Shortness of breath EXAM: PORTABLE CHEST 1 VIEW COMPARISON:  Four days ago FINDINGS: Cardiomegaly and vascular pedicle widening, especially the main pulmonary artery. Right PICC with tip at the upper cavoatrial junction. Hazy density at both lung bases. There could be small volume pleural fluid. No pneumothorax. IMPRESSION: Cardiomegaly, pulmonary artery enlargement, and lower lobe atelectasis or infiltrates. Electronically Signed   By: Jorje Guild M.D.   On: 08/13/2021 06:02     LOS: 12 days   Oren Binet, MD  [facility]    To contact the attending provider between 7A-7P or the covering provider during after hours 7P-7A, please log into the web site [email or web address] and access using universal [place] password for that web site. If you do not have the password, please call the hospital operator.  08/14/2021, 11:35 AM

## 2021-08-14 NOTE — Progress Notes (Signed)
ANTICOAGULATION CONSULT NOTE - Follow Up Consult ? ?Pharmacy Consult for Warfarin ?Indication: history of pulmonary embolus and SLE ? ?Allergies  ?Allergen Reactions  ? Oxycodone-Acetaminophen Shortness Of Breath and Nausea Only  ? Propoxyphene Nausea Only and Shortness Of Breath  ?  States takes tylenol at home  ? Sulfonamide Derivatives Shortness Of Breath and Nausea Only  ? Codeine Nausea Only  ? Hydrocodone-Acetaminophen   ?  unknown  ? Hydromorphone   ?  On MAR  ? Other Other (See Comments)  ?  No blood, Jehovaeh Witness   ? Sulfamethoxazole   ? Tape   ?  Redness**PAPER TAPE OK**  ? Gabapentin Anxiety  ?  twitching  ? Latex Rash  ? Metoprolol Rash  ? Morphine And Related Rash  ?  IV site on arm is red, patient reports this is improving.  NO shortness of breath reported. Patient is given dose of Benadryl when taking Morphine   ? Rosiglitazone Rash  ? ? ?Patient Measurements: ?Height: 5' (152.4 cm) ?Weight: 88.9 kg (196 lb) ?IBW/kg (Calculated) : 45.5 ?Heparin Dosing Weight: 63.7 kg ? ?Vital Signs: ?Temp: 99.4 ?F (37.4 ?C) (03/10 0756) ?Temp Source: Oral (03/10 0756) ?BP: 109/69 (03/10 0756) ?Pulse Rate: 100 (03/10 0813) ? ?Labs: ?Recent Labs  ?  08/12/21 ?3491 08/13/21 ?0123 08/14/21 ?0425  ?HGB 7.3* 7.5*  --   ?HCT 23.3* 24.6*  --   ?PLT 201 204  --   ?LABPROT 21.5* 21.1* 23.9*  ?INR 1.9* 1.8* 2.1*  ?CREATININE 0.91 0.95 0.98  ? ? ? ?Estimated Creatinine Clearance: 60.6 mL/min (by C-G formula based on SCr of 0.98 mg/dL). ? ?Assessment: ?61 yo female on warfarin PTA for hx PE and SLE. INR supratherapeutic at 5.3 on admission and very labile with IV vitamin K doses. Patient is very malnourished. Labile INR likely due to severe vitamin K deficiency so started on 100 mcg vitamin K PO daily 3/4 with improvement in INR stability.  Pharmacy consulted for warfarin.  ? ?INR 2.1 and therapeutic. Will stop enoxaparin. H/H, plt stable.  ? ?Warfarin PTA:  3.'5mg'$  and '4mg'$  alternating days  ? ?Goal of Therapy:  ?INR  2-3 ?Monitor platelets by anticoagulation protocol: Yes ?  ?Plan:  ?Warfarin 5 mg x1  ?Stop enoxaparin ?Vitamin K 100 mcg po qday ?Monitor daily INR ?Monitor for signs/symptoms of bleeding  ? ?Benetta Spar, PharmD, BCPS, BCCP ?Clinical Pharmacist ? ?Please check AMION for all [place] phone numbers ?After 10:00 PM, call [place] [phone] ? ?

## 2021-08-14 NOTE — TOC Initial Note (Signed)
Transition of Care (TOC) - Initial/Assessment Note  Marvetta Gibbons RN, BSN Transitions of Care Unit 4E- RN Case Manager See Treatment Team for direct phone #    Patient Details  Name: Connie Ruiz MRN: [id number] Date of Birth: Dec 04, 1960  Transition of Care [facility]) CM/SW Contact:    Dawayne Patricia, RN Phone Number: 08/14/2021, 2:27 PM  Clinical Narrative:                 Noted referral from [facility] for Residential Hospice- preference for [facility].  Spoke with Fabio Pierce at [facility] regarding [employer]. Per Fabio Pierce she received call this am from [place] w/ [facility] and is working on referral now. Plans to call daughter about Residential Hospice and [facility] this afternoon.  Referral pending review and bed availability.   Pt will need GOLD DNR for transport, will follow for confirmation from [place] on [place] referral.   Expected Discharge Plan: [place] Barriers to Discharge: Hospice Bed not available   Patient Goals and CMS Choice Patient states their goals for this hospitalization and ongoing recovery are:: comfort care- no feeding tubes CMS Medicare.gov Compare Post Acute Care list provided to:: Patient Choice offered to / list presented to : Patient, Adult Children  Expected Discharge Plan and Services Expected Discharge Plan: [place] In-house Referral: Clinical Social Work Discharge Planning Services: CM Consult Post Acute Care Choice: Hospice Living arrangements for the past 2 months: Single Family Home                                      Prior Living Arrangements/Services Living arrangements for the past 2 months: Single Family Home Lives with:: Self Patient language and need for interpreter reviewed:: Yes Do you feel safe going back to the place where you live?: Yes          Current home services: Homehealth aide Criminal Activity/Legal Involvement Pertinent to Current Situation/Hospitalization: No -  Comment as needed  Activities of Daily Living Home Assistive Devices/Equipment: CBG Meter ADL Screening (condition at time of admission) Patient's cognitive ability adequate to safely complete daily activities?: No Is the patient deaf or have difficulty hearing?: No Does the patient have difficulty seeing, even when wearing glasses/contacts?: No Does the patient have difficulty concentrating, remembering, or making decisions?: No Patient able to express need for assistance with ADLs?: Yes Does the patient have difficulty dressing or bathing?: Yes Independently performs ADLs?: No Communication: Needs assistance Is this a change from baseline?: Pre-admission baseline Dressing (OT): Dependent Is this a change from baseline?: Pre-admission baseline Grooming: Dependent Is this a change from baseline?: Pre-admission baseline Feeding: Dependent Is this a change from baseline?: Pre-admission baseline Bathing: Dependent Is this a change from baseline?: Pre-admission baseline Toileting: Dependent Is this a change from baseline?: Pre-admission baseline In/Out Bed: Dependent Is this a change from baseline?: Pre-admission baseline Walks in Home: Dependent Is this a change from baseline?: Pre-admission baseline Does the patient have difficulty walking or climbing stairs?: Yes Weakness of Legs: Right Weakness of Arms/Hands: Right  Permission Sought/Granted                  Emotional Assessment       Orientation: : Oriented to Self, Oriented to Place, Oriented to  Time, Oriented to Situation Alcohol / Substance Use: Not Applicable Psych Involvement: No (comment)  Admission diagnosis:  Acute respiratory failure with hypoxia (  [place]) [J96.01] Endotracheal tube present [Z97.8] Sepsis due to pneumonia ([place]) [J18.9, A41.9] Acute hypoxemic respiratory failure ([place]) [J96.01] Patient Active Problem List   Diagnosis Date Noted   Demand ischemia ([place])    Acute combined systolic and  diastolic heart failure (HCC)    Malnutrition of moderate degree 08/06/2021   Pressure injury of skin 08/03/2021   Septic shock ([place])    Sepsis due to pneumonia ([place]) 08/02/2021   Acute hypoxemic respiratory failure ([place]) 08/02/2021   Chronic kidney disease, stage 3a ([place]) 08/02/2021   Refusal of blood transfusions as patient is [name] 08/02/2021   AKI (acute kidney injury) ([place]) 05/26/2021   Rectal bleeding 05/26/2021   Anemia of chronic disease 05/26/2021   Prolonged QT interval 05/26/2021   Enteritis 01/27/2021   Hypothyroidism, 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   Hypothyroidism 01/14/2018   HLD (hyperlipidemia) 01/14/2018   CVA (cerebral vascular accident) ([place]) 01/12/2018   Chest pain 01/11/2018   Acute respiratory failure with hypoxia ([place]) 07/20/2017   Abdominal bloating 07/20/2017   Tachycardia 07/20/2017   SLE (systemic lupus erythematosus related syndrome) ([place]) 07/20/2017   CAP (community acquired pneumonia) 07/16/2017   Screening examination for infectious disease 01/28/2016   Type 2 diabetes mellitus ([place]) 08/03/2015   Urinary tract infection 07/20/2015   Vascular dementia ([place]) 05/08/2015   Hemiparesis as late effect of cerebrovascular accident (CVA) ([place]) 05/08/2015   Aphasia as late effect of stroke 05/08/2015   C. difficile colitis 02/25/2015   Hypokalemia 02/25/2015   CKD (chronic kidney disease) 02/25/2015   Colitis 02/14/2015   Sepsis ([place]) 02/14/2015   Nausea vomiting and diarrhea 02/14/2015   Abdominal pain 02/14/2015   UTI (lower urinary tract infection) 02/14/2015   Abdominal pain, chronic, generalized 01/07/2015   Candida esophagitis ([place]) 11/12/2014   Abnormal CT of the abdomen    Steroid-induced hyperglycemia 11/09/2014   Hypomagnesemia 11/06/2014   Acute abdominal pain    Supratherapeutic INR 11/02/2014   Jejunitis 11/02/2014   Myalgia and myositis 04/05/2014   Wellness examination 04/05/2014   Menopausal state 04/05/2014   Palpitations  08/31/2013   Hyperlipidemia 08/31/2013   Disorder of heart rhythm 08/13/2013   TIA (transient ischemic attack) 06/20/2013   Gout flare: R elbow and R shoulder 06/20/2013   Acute encephalopathy 06/14/2013   Rhabdomyolysis 06/14/2013   History of stroke with residual effects    Right sided weakness    Breast mass, left 04/30/2013   Contusion of knee, left 10/18/2012   Depression 10/05/2012   Encounter for long-term (current) use of other medications 10/04/2012   Multiple thyroid nodules 06/08/2012   Risk for falls 05/05/2012   Physical deconditioning 05/05/2012   Fever 04/27/2012   DVT (deep venous thrombosis) ([place]) 06/17/2011   Expressive aphasia 06/17/2011   Dysphasia 06/11/2011   Incontinence of urine 06/11/2011   Hand pain, left 06/11/2011   Immunosuppressive management encounter following kidney transplant 05/20/2011   Hypertension goal BP (blood pressure) < 130/80 05/20/2011   History of renal transplant 05/20/2011   Arm pain, left 05/07/2011   Dysfunctional voiding of urine 04/28/2011   Urge incontinence 04/28/2011   Urinary urgency 04/28/2011   Routine general medical examination at a health care facility 04/13/2011   Anticoagulated 01/08/2011   Pulmonary embolism ([place]) 07/16/2010   Cerebral artery occlusion with cerebral infarction ([place]) 04/17/2010   CEREBROVASCULAR ACCIDENT, ACUTE 04/15/2010   THYROID NODULE, LEFT 04/10/2009   Cough 03/26/2009   ACUTE BRONCHITIS 08/22/2007   KIDNEY TRANSPLANTATION, HX OF 08/22/2007   Gout 08/21/2007  Essential hypertension 08/21/2007   Chronic diastolic congestive heart failure ([place]) 08/21/2007   GERD 08/21/2007   Disorder resulting from impaired renal function 08/21/2007   LUPUS 08/21/2007   Osteoporosis 08/21/2007   DVT, HX OF 08/21/2007   Enteritis due to Clostridium difficile 08/21/2007   PCP:  [name], [name], MD Pharmacy:   [place], [place] - [address] [place] [address] Phone: [phone] Fax: [phone]     Social Determinants of Health (SDOH) Interventions    Readmission Risk Interventions Readmission Risk Prevention Plan 05/29/2021  Transportation Screening Complete  Medication Review (RN Care Manager) Complete  PCP or Specialist appointment within 3-5 days of discharge Complete  HRI or [place] Complete  SW Recovery Care/Counseling Consult Complete  Palliative Care Screening Not [place] Complete  Some recent data might be hidden

## 2021-08-14 NOTE — Discharge Summary (Signed)
PATIENT DETAILS Name: Connie Ruiz Age: 61 y.o. Sex: female Date of Birth: 18-Jul-1960 MRN: [id number]. Admitting Physician: Chesley Mires, MD DJS:[id number], Malka So, MD  Admit Date: 08/02/2021 Discharge date: 08/14/2021  Recommendations for Outpatient Follow-up:  Optimize comfort care  Admitted From:  SNF   Disposition: Hospice care   Discharge Condition: poor  CODE STATUS:   Code Status: DNR   Diet recommendation:  Diet Order             Diet general           DIET - DYS 1 Room service appropriate? No; Fluid consistency: Nectar Thick  Diet effective now                    Brief Summary: Patient is a 61 y.o.  female with history of kidney transplant, VTE/antiphospholipid syndrome on chronic Coumadin therapy, CVA with residual aphasia-slight right-sided deficits-resident of SNF who presented to the hospital with acute respiratory distress and fever, she was intubated in the emergency room-and managed in the ICU.  She was subsequently stabilized-extubated on 2/28-unfortunately-she required insertion of NG tube for feedings.  Her hypoxia gradually improved-and patient was transferred to [facility] service on 3/3.  Further hospital course was complicated by development of  volume overload (echo showed new systolic heart failure), severe failure to thrive syndrome with poor oral intake.  See below for further details   Significant Hospital events: 2/26>> admit to ICU-intubated. 2/27>> On vent, remains on Levophed 29mg, Vanc, Cefepime  2/28>> Extub[facility]03[facility]  [facility]>> MBS completed-dysphagia 1 diet started-NG tube removed 3/08>>poor oral intake-calorie count started 3/10>>hardly any oral intake-family meeting along with pall care-DNR-residential hospice    Significant imaging studies: 2/26>> CXR: RLL infiltrate. 2/26>> CT head: No acute intracranial findings, chronic left MCA territory infarct. 3/01>> CT head: No acute intracranial abnormalities. 3/01>> RUQ  ultrasound: Subtle nodularity of the liver-nonspecific-could reflect early cirrhosis. 3/05>> Echo: EF 35-40%, RVSP 40.7 mmHg.   Significant microbiology data: 2/26>> COVID/influenza PCR: Negative 2/26>> blood culture: Negative 2/27>> urine culture: Negative   Procedures: ETT>>2/26-2/28   Consults:  CCM, nephrology,palliative care    Brief Hospital Course: Acute hypoxic respiratory failure likely due to aspiration pneumonia: Hypoxia has improved-remains on 1-2 L of oxygen-has completed 7 days of Rocephin.  All cultures negative to date.  CXR continues to show persistent RLL infiltrates.  She continues to exhibit signs/symptoms of intermittent aspiration-has transmitted upper airway sounds-continues to accumulate secretions intermittently.  Remains on Robinul.  Family aware of tenuous clinical situation-given the fact that she is so weak and debilitated (bedbound status at baseline) she remains at significant risk for recurrent aspiration episodes.     Septic shock (present on admission): Sepsis physiology resolved-cultures negative so far-completed antibiotic course   AKI-on CKD stage IIIa-history of renal transplant: AKI likely hemodynamically mediated-renal function has improved.  No longer on stress dose steroids-on chronic dosing of prednisone, on tacrolimus.  Neurology has signed off.     Hypernatremia: Resolved-due to poor oral intake and use of diuretics.   Failure to thrive syndrome: Although clinically improved from hypoxia/PNA-she has developed severe failure to thrive syndrome.  Oral intake remains very poor-in fact minimal.  NG tube was discontinued several days ago, patient does not desire feeding/peg tube.  Extensive discussion with family today by myself and EWalden FieldNP with palliative care-really no good options-with her having very minimal oral intake-family aware that this is incompatible with life.  After extensive discussion-made DNR-family/patient  elected to pursue  residential hospice placement.    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  No longer on furosemide as oral intake is so poor-and at risk for AKI/hyponatremia.  Was placed on bisoprolol and losartan.   Unclear whether this is Takotsubo cardiomyopathy or ischemic etiology.  Cardiology had plan to pursue ischemic evaluation as an outpatient-but do not feel that this is necessary as plans are to transition to comfort measures.     Sinus tachycardia: Remains stable-with heart rate in the low 100s range-was on bisoprolol.   Likely provoked by acute illness/PNA/physiological stress.     Prolonged QTc: Likely due to hypomagnesemia-QTc much better after replacing magnesium.  Continue telemetry monitoring-avoid QTc prolonging agents.   Atypical chest pain: Continues to have chest pain-will use as needed nitroglycerin and fentanyl for comfort.    Dysphagia: Likely chronic in the setting of prior CVA/dysarthria-worsened due to severe debility from acute illness-did have a NG tube/ cortak tube with feedings-but this was discontinued on 3/7.  She is now tolerating dysphagia 1 diet-but appetite remains very poor   Hypokalemia/hypophosphatemia: Repleted.   Normocytic anemia: Likely due to critical illness-no evidence of blood loss-follow closely-patient is a [name] and refuses blood products at all costs.   DM-2 (A1c 6.0 on 05/27/2021) with steroid-induced hyperglycemia: CBGs stable-at risk for hypoglycemia-changed to sensitive SSI.    Has very poor oral intake-not a candidate for aggressive glycemic control. On discharge to residential hospice does not need SSI   HTN: BP stable-was on losartan and bisoprolol.  Coreg discontinued due to bronchospasm.   History of PE-antiphospholipid syndrome: INR therapeutic-was on coumadin/Lovenox-but will discontinue on d/c  to hospice   History of multiple CVA's with chronic aphasia/right-sided weakness: At baseline-CT head x2 negative for acute abnormalities.  Resident of SNF.   Vascular dementia: Given plans to transfer to hospice to plans to resume Aricept, Prozac, mirtazapine and amitriptyline    Hypothyroidism: was On levothyroxine.  TSH mildly elevated- unclear to me when it was last adjusted.   Refusal of blood transfusions as patient is [name]   Debility/Deconditioning: Chronically frail and weak-Per daughter-patient has been very minimally ambulatory for the past 1 year and is mostly bed to wheelchair bound.  Her last CVA was 8 years ago.     Palliative care: Long discussion with family (daughter and sister-in-law at bedside)-by this MD and palliative care NP.  Explained that patient has been relatively stable for the past several days but oral intake is very poor-and is incompatible with life.  Patient does not desire feeding tube.  Recommended that it may be prudent to consider hospice measures. After family meeting-DNR-residential hospice   Nutrition Status: Nutrition Problem: Moderate Malnutrition Etiology: chronic illness (CHF, CVA) Signs/Symptoms: mild fat depletion, moderate muscle depletion, percent weight loss (16% weight loss in 6 months) Percent weight loss: 16 % (6 months) Interventions: Tube feeding, Prostat    Obesity: Estimated body mass index is 38.28 kg/m as calculated from the following:   Height as of this encounter: 5' (1.524 m).   Weight as of this encounter: 88.9 kg.      Discharge Diagnoses:  Principal Problem:   Acute hypoxemic respiratory failure (HCC) Active Problems:   Essential hypertension   History of stroke with residual effects   Supratherapeutic INR   Vascular dementia ([place])   Type 2 diabetes mellitus ([place])  Tachycardia   History of renal transplant   Sepsis due to pneumonia [facility])   Chronic kidney disease, stage 3a ([place])   Refusal of blood  transfusions as patient is [name]   Pressure injury of skin   Septic shock (HCC)   Malnutrition of moderate degree   Acute combined systolic and diastolic heart failure ([place])   Demand ischemia [facility])   Discharge Instructions:  Activity:  As tolerated with Full fall precautions use walker/cane & assistance as needed   Discharge Instructions     Diet general   Complete by: As directed    Increase activity slowly   Complete by: As directed    No wound care   Complete by: As directed       Allergies as of 08/14/2021       Reactions   Oxycodone-acetaminophen Shortness Of Breath, Nausea Only   Propoxyphene Nausea Only, Shortness Of Breath   States takes tylenol at home   Sulfonamide Derivatives Shortness Of Breath, Nausea Only   Codeine Nausea Only   Hydrocodone-acetaminophen    unknown   Hydromorphone    On MAR   Other Other (See Comments)   No blood, Jehovaeh Witness    Sulfamethoxazole    Tape    Redness**PAPER TAPE OK**   Gabapentin Anxiety   twitching   Latex Rash   Metoprolol Rash   Morphine And Related Rash   IV site on arm is red, patient reports this is improving.  NO shortness of breath reported. Patient is given dose of Benadryl when taking Morphine    Rosiglitazone Rash        Medication List     STOP taking these medications    acetaminophen 500 MG tablet Commonly known as: TYLENOL   alendronate 70 MG tablet Commonly known as: FOSAMAX   amitriptyline 10 MG tablet Commonly known as: ELAVIL   calcitRIOL 0.25 MCG capsule Commonly known as: ROCALTROL   carvedilol 6.25 MG tablet Commonly known as: COREG   cholecalciferol 25 MCG (1000 UNIT) tablet Commonly known as: VITAMIN D3   cholestyramine 4 g packet Commonly known as: QUESTRAN   dicyclomine 10 MG capsule Commonly known as: BENTYL   diltiazem 360 MG 24 hr capsule Commonly known as: TIAZAC   donepezil 10 MG tablet Commonly known as: ARICEPT   esomeprazole 20 MG  capsule Commonly known as: NEXIUM   eucerin cream   famotidine 20 MG tablet Commonly known as: PEPCID   feeding supplement (PRO-STAT SUGAR FREE 64) Liqd   FLUoxetine 20 MG capsule Commonly known as: PROZAC   furosemide 20 MG tablet Commonly known as: LASIX   HumaLOG 100 UNIT/ML injection Generic drug: insulin lispro   hydrocortisone 25 MG suppository Commonly known as: ANUSOL-HC   lactose free nutrition Liqd   levothyroxine 150 MCG tablet Commonly known as: SYNTHROID   loperamide 2 MG capsule Commonly known as: IMODIUM   mirtazapine 30 MG tablet Commonly known as: REMERON   morphine 15 MG tablet Commonly known as: MSIR   multivitamin tablet   ondansetron 4 MG disintegrating tablet Commonly known as: Zofran ODT   potassium chloride SA 20 MEQ tablet Commonly known as: KLOR-CON M   predniSONE 5 MG tablet Commonly known as: DELTASONE   PSYLLIUM PO   Slow-Mag 64 MG Tbec SR tablet Generic drug: magnesium chloride   tacrolimus 0.5 MG capsule Commonly known as: PROGRAF   tacrolimus 1 MG capsule Commonly known as: PROGRAF   warfarin 3 MG tablet Commonly known  as: COUMADIN   warfarin 4 MG tablet Commonly known as: COUMADIN       TAKE these medications    fentaNYL 50 MCG/ML injection Commonly known as: SUBLIMAZE Inject 1 mL (50 mcg total) into the vein every 4 (four) hours as needed for severe pain.   glycopyrrolate 0.2 MG/ML injection Commonly known as: ROBINUL Inject 0.5 mLs (0.1 mg total) into the vein 3 (three) times daily as needed.   magic mouthwash w/lidocaine Soln Take 15 mLs by mouth 4 (four) times daily as needed for mouth pain.   nitroGLYCERIN 0.4 MG SL tablet Commonly known as: NITROSTAT Place 1 tablet (0.4 mg total) under the tongue every 5 (five) minutes as needed for chest pain.        Follow-up Information     [name], [name], MD Follow up.   Specialty: Family Medicine Why: As needed Contact information: [place] [address] [phone]         Lorretta Harp, MD Follow up.   Specialties: Cardiology, Radiology Why: As needed Contact information: [address] [place] [address] [phone]                Allergies  Allergen Reactions   Oxycodone-Acetaminophen Shortness Of Breath and Nausea Only   Propoxyphene Nausea Only and Shortness Of Breath    States takes tylenol at home   Sulfonamide Derivatives Shortness Of Breath and Nausea Only   Codeine Nausea Only   Hydrocodone-Acetaminophen     unknown   Hydromorphone     On MAR   Other Other (See Comments)    No blood, Jehovaeh Witness    Sulfamethoxazole    Tape     Redness**PAPER TAPE OK**   Gabapentin Anxiety    twitching   Latex Rash   Metoprolol Rash   Morphine And Related Rash    IV site on arm is red, patient reports this is improving.  NO shortness of breath reported. Patient is given dose of Benadryl when taking Morphine    Rosiglitazone Rash     Other Procedures/Studies: CT HEAD WO CONTRAST (5MM)  Result Date: 08/05/2021 CLINICAL DATA:  Acute neurological deficit, suspected stroke EXAM: CT HEAD WITHOUT CONTRAST TECHNIQUE: Contiguous axial images were obtained from the base of the skull through the vertex without intravenous contrast. RADIATION DOSE REDUCTION: This exam was performed according to the departmental dose-optimization program which includes automated exposure control, adjustment of the mA and/or kV according to patient size and/or use of iterative reconstruction technique. COMPARISON:  08/02/2021 FINDINGS: Brain: Generalized atrophy. Ex vacuo dilatation of the ventricular system particularly frontal horn of LEFT lateral ventricle. No midline shift or mass effect. Small vessel chronic ischemic changes of deep cerebral white matter. Old infarcts BILATERAL cerebellar hemispheres and LEFT MCA territory involving LEFT basal ganglia, LEFT frontal lobe and LEFT temporal  lobe. Appearance is unchanged since the prior study. No intracranial hemorrhage, mass lesion, evidence of acute infarction, or extra-axial fluid collection. Vascular: No hyperdense vessels. Atherosclerotic calcifications present in internal carotid and vertebral arteries bilaterally Skull: Intact Sinuses/Orbits: Scattered mucosal thickening and fluid throughout the paranasal sinuses and mastoid air cells Other: N/A IMPRESSION: Atrophy with small vessel chronic ischemic changes of deep cerebral white matter. Old infarcts BILATERAL cerebellar hemispheres and LEFT MCA territory. No acute intracranial abnormalities. Electronically Signed   By: Lavonia Dana M.D.   On: 08/05/2021 12:44   CT Head Wo Contrast  Result Date: 08/02/2021 CLINICAL DATA:  Altered  mental status, etiology unknown. EXAM: CT HEAD WITHOUT CONTRAST TECHNIQUE: Contiguous axial images were obtained from the base of the skull through the vertex without intravenous contrast. RADIATION DOSE REDUCTION: This exam was performed according to the departmental dose-optimization program which includes automated exposure control, adjustment of the mA and/or kV according to patient size and/or use of iterative reconstruction technique. COMPARISON:  CT 09/10/2019. FINDINGS: Brain: Again noted is a chronic left MCA infarct involving portions of the left frontal, temporal and parietal lobes with ex vacuo expansion of the left lateral ventricle and chronic calcification in anterior left temporal lobe. There is additional finding of atrophy and moderately advanced small vessel disease the latter greater in the left hemisphere, with atrophic ventricular prominence on the right. There are multiple lacunar infarcts in both cerebellar hemispheres, more numerous on the right. No definitive finding of acute cortical based infarct, hemorrhage or mass is seen , no midline shift. Vascular: The carotid siphons and distal vertebral arteries are heavily calcified but there are no  hyperdense central vessels. Skull: Normal. Negative for fracture or focal lesion. Sinuses/Orbits: There is increased membrane disease throughout the ethmoid sinus but no fluid levels. Mild membrane thickening in the maxillary sinuses. Other sinuses, bilateral mastoid air cells are clear. Other: None. IMPRESSION: No acute intracranial CT findings. Chronic left MCA territory infarct. Additional chronic change including lacunar infarcts. Obtain MRI if there is concern for occult infarct. Increased membrane disease in the ethmoid air cells. Electronically Signed   By: Telford Nab M.D.   On: 08/02/2021 07:19   DG Chest Port 1 View  Result Date: 08/13/2021 CLINICAL DATA:  Shortness of breath EXAM: PORTABLE CHEST 1 VIEW COMPARISON:  Four days ago FINDINGS: Cardiomegaly and vascular pedicle widening, especially the main pulmonary artery. Right PICC with tip at the upper cavoatrial junction. Hazy density at both lung bases. There could be small volume pleural fluid. No pneumothorax. IMPRESSION: Cardiomegaly, pulmonary artery enlargement, and lower lobe atelectasis or infiltrates. Electronically Signed   By: Jorje Guild M.D.   On: 08/13/2021 06:02   DG Chest Port 1 View  Result Date: 08/09/2021 CLINICAL DATA:  Altered mental status. EXAM: PORTABLE CHEST 1 VIEW COMPARISON:  08/05/2021 FINDINGS: There is a feeding tube with tip below the level of the hemidiaphragms. Right arm PICC line tip is at the cavoatrial junction. Stable cardiomediastinal contours. Bilateral pleural effusions, right greater than left, are again noted and not significantly changed from previous exam. IMPRESSION: 1. Stable bilateral pleural effusions, right greater than left. 2. Stable support apparatus. 3. No change in aeration to the lungs. Electronically Signed   By: Kerby Moors M.D.   On: 08/09/2021 08:41   DG CHEST PORT 1 VIEW  Result Date: 08/05/2021 CLINICAL DATA:  Hypoxia. EXAM: PORTABLE CHEST 1 VIEW COMPARISON:  Chest x-ray dated  August 03, 2021. FINDINGS: New right upper extremity PICC line with tip in the distal SVC. Stable cardiomediastinal silhouette. Similar right greater than left pleural effusions and bibasilar atelectasis/consolidation. No pneumothorax. No acute osseous abnormality. IMPRESSION: 1. New right upper extremity PICC line with tip in the distal SVC. 2. Similar right greater than left pleural effusions and bibasilar atelectasis/consolidation. Electronically Signed   By: Titus Dubin M.D.   On: 08/05/2021 10:42   DG CHEST PORT 1 VIEW  Result Date: 08/03/2021 CLINICAL DATA:  Tachypnea. EXAM: PORTABLE CHEST 1 VIEW COMPARISON:  August 02, 2021. FINDINGS: Stable cardiomegaly. Mild bibasilar atelectasis or edema is noted with small bilateral pleural effusions. Bony thorax is  unremarkable. IMPRESSION: Mild bibasilar atelectasis or edema is noted with associated pleural effusions. Aortic Atherosclerosis (ICD10-I70.0). Electronically Signed   By: Marijo Conception M.D.   On: 08/03/2021 16:22   DG Chest Portable 1 View  Result Date: 08/02/2021 CLINICAL DATA:  Endotracheal tube and orogastric tube placement. EXAM: PORTABLE CHEST 1 VIEW COMPARISON:  08/02/2021 FINDINGS: There has been placement of an endotracheal tube, with tip approximately 3.5 cm above the carina. New orogastric tube is seen with tip overlying the body of the stomach. Significantly improved aeration of both lungs is seen since prior exam. Left lower lobe atelectasis or consolidation is noted. Small left pleural effusion cannot be excluded. Right lung is clear. Aortic atherosclerotic calcification noted. IMPRESSION: Endotracheal tube and orogastric tube in appropriate position. Significantly improved aeration of both lungs. Persistent left lower lobe atelectasis or consolidation and possible small left pleural effusion. Electronically Signed   By: Marlaine Hind M.D.   On: 08/02/2021 14:08   DG CHEST PORT 1 VIEW  Result Date: 08/02/2021 CLINICAL DATA:   Respiratory distress.  Unresponsive. EXAM: PORTABLE CHEST 1 VIEW COMPARISON:  08/02/2021 FINDINGS: Low lung volumes. Cardiac configuration appears unchanged. There is persistent opacity in the RIGHT lung base consistent with atelectasis or infiltrate and possible pleural effusion. There is increased atelectasis in the RIGHT perihilar region. Suspect increased LEFT LOWER lobe opacity, now obscuring the hemidiaphragm. IMPRESSION: Increased LEFT LOWER lobe opacification. Increased RIGHT perihilar atelectasis. Electronically Signed   By: Nolon Nations M.D.   On: 08/02/2021 11:36   DG Chest Port 1 View  Result Date: 08/02/2021 CLINICAL DATA:  Questionable sepsis. EXAM: PORTABLE CHEST 1 VIEW COMPARISON:  AP Lat 01/26/2021. FINDINGS: There are numerous overlying monitor wires. Chronically elevated right hemidiaphragm. There is increased opacity in the right lower lung field concerning for pneumonia and/or pleural effusion. The remaining lungs clear with COPD change. There is cardiomegaly with prominent central pulmonary arteries, aortic atherosclerosis and tortuosity. Stable mediastinum.  Osteopenia and thoracic spondylosis. IMPRESSION: Increased opacity right lower lung field consistent with consolidation, atelectasis, pleural effusion or combination. A PA and lateral study would be helpful. Additional chronic vascular findings discussed above. Electronically Signed   By: Telford Nab M.D.   On: 08/02/2021 05:47   DG Abd Portable 1V  Result Date: 08/05/2021 CLINICAL DATA:  Feeding tube placement EXAM: PORTABLE ABDOMEN - 1 VIEW COMPARISON:  02/03/2021 FINDINGS: Tip of enteric tube is seen in the distal antrum or pylorus of the stomach. Bowel gas pattern is nonspecific. Increased markings are seen in the lower lung fields. Surgical clips are seen in gallbladder fossa. IMPRESSION: Tip of enteric tube is seen in the region of distal antrum of stomach or pylorus. Electronically Signed   By: Elmer Picker M.D.    On: 08/05/2021 12:09   DG Swallowing Func-Speech Pathology  Result Date: 08/11/2021 Table formatting from the original result was not included. Objective Swallowing Evaluation: Type of Study: MBS-Modified Barium Swallow Study  Patient Details Name: NIKIA LEVELS MRN: [id number] Date of Birth: 31-Jan-1961 Today's Date: 08/11/2021 Time: SLP Start Time (ACUTE ONLY): 8916 -SLP Stop Time (ACUTE ONLY): 1400 SLP Time Calculation (min) (ACUTE ONLY): 25 min Past Medical History: Past Medical History: Diagnosis Date  Anxiety   Candida esophagitis ([place]) 11/12/2014  CEREBROVASCULAR ACCIDENT, ACUTE 04/15/2010  CLOSTRIDIUM DIFFICILE COLITIS, HX OF 08/21/2007  CONGESTIVE HEART FAILURE 08/21/2007  Current use of long term anticoagulation   Dr. Andree Elk, [facility]  CVA 04/17/2010  Depression   Dr. Andree Elk, [facility]  DIABETES  MELLITUS, TYPE II 08/21/2007  DVT, HX OF 08/21/2007  GERD 08/21/2007  GOUT 08/21/2007  History of stroke with residual effects   HYPERLIPIDEMIA 08/21/2007  HYPERTENSION 08/21/2007  Dr. Andree Elk, [place], HX OF 08/22/2007  s/p renal transplant-Dr. Bonney Leitz  LUPUS 08/21/2007  OSTEOPOROSIS 08/21/2007  Rheumatol at [facility]  Pulmonary embolism ([place]) 07/16/2010  Renal failure   RENAL INSUFFICIENCY 08/21/2007  Right sided weakness   Steroid-induced hyperglycemia 11/09/2014  Tachycardia   THYROID NODULE, LEFT 04/10/2009 Past Surgical History: Past Surgical History: Procedure Laterality Date  BIOPSY  02/04/2021  Procedure: BIOPSY;  Surgeon: [name], MD;  Location: [facility] ENDOSCOPY;  Service: Endoscopy;;  CESAREAN SECTION    CHOLECYSTECTOMY    COLONOSCOPY  06/2000  [facility]   ENTEROSCOPY N/A 11/11/2014  Procedure: ENTEROSCOPY;  Surgeon: Ladene Artist, MD;  Location: [facility] ENDOSCOPY;  Service: Endoscopy;  Laterality: N/A;  FLEXIBLE SIGMOIDOSCOPY N/A 02/04/2021  Procedure: FLEXIBLE SIGMOIDOSCOPY;  Surgeon: [name], MD;  Location: [facility] ENDOSCOPY;  Service: Endoscopy;  Laterality: N/A;  KIDNEY TRANSPLANT  Right 2009  RENAL BIOPSY, OPEN  1981  TUBAL LIGATION   HPI: Patient is a 61 y.o. 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 She was admitted on 2/26 with fever, hypoxia and found to have aspiration PNA. Intubated 2/26-28. Dx acute hypoxic resp failure likely due to aspiration pna, septic shock (resolved), deconditioning. Pt has had cortrak, has been NPO.  Subjective: alert  Recommendations for follow up therapy are one component of a multi-disciplinary discharge planning process, led by the attending physician.  Recommendations may be updated based on patient status, additional functional criteria and insurance authorization. Assessment / Plan / Recommendation Clinical Impressions 08/11/2021 Clinical Impression Pt presents with a mod-severe oral and mild pharyngeal dysphagia. Oral phase is marked by significant difficulty with initiation of motor sequence for swallowing.  There are subtle movements and tremors noted in tongue - significant time passes before the tongue can propel material into the pharynx. When the swallow was triggered,  thin liquids were aspirated in trace amounts; nectar thick liquids did not penetrate the larynx/ no aspiration.  There was no pharyngeal residue post-swallow.  Recommend initiating a dysphagia 1 diet, nectar thick liquids for now; crush meds in puree. SLP Visit Diagnosis Dysphagia, oral phase (R13.11) Attention and concentration deficit following -- Frontal lobe and executive function deficit following -- Impact on safety and function Mild aspiration risk   Treatment Recommendations 08/11/2021 Treatment Recommendations Therapy as outlined in treatment plan below   Prognosis 08/11/2021 Prognosis for Safe Diet Advancement Fair Barriers to Reach Goals -- Barriers/Prognosis Comment -- Diet Recommendations 08/11/2021 SLP Diet Recommendations Thin liquid;Dysphagia 1 (Puree) solids Liquid  Administration via Cup;Straw Medication Administration Crushed with puree Compensations Minimize environmental distractions Postural Changes --   Other Recommendations 08/11/2021 Recommended Consults -- Oral Care Recommendations Oral care BID Other Recommendations Order thickener from pharmacy Follow Up Recommendations Skilled nursing-short term rehab (<3 hours/day) Assistance recommended at discharge Frequent or constant Supervision/Assistance Functional Status Assessment Patient has had a recent decline in their functional status and demonstrates the ability to make significant improvements in function in a reasonable and predictable amount of time. Frequency and Duration  08/11/2021 Speech Therapy Frequency (ACUTE ONLY) min 2x/week Treatment Duration 2 weeks   Oral Phase 08/11/2021 Oral Phase Impaired Oral - Pudding Teaspoon -- Oral - Pudding Cup -- Oral - Honey Teaspoon -- Oral - Honey  Cup -- Oral - Nectar Teaspoon -- Oral - Nectar Cup -- Oral - Nectar Straw Weak lingual manipulation;Reduced posterior propulsion;Holding of bolus;Delayed oral transit Oral - Thin Teaspoon -- Oral - Thin Cup -- Oral - Thin Straw Weak lingual manipulation;Reduced posterior propulsion;Holding of bolus;Delayed oral transit Oral - Puree Weak lingual manipulation;Reduced posterior propulsion;Holding of bolus;Delayed oral transit Oral - Mech Soft -- Oral - Regular -- Oral - Multi-Consistency -- Oral - Pill -- Oral Phase - Comment --  Pharyngeal Phase 08/11/2021 Pharyngeal Phase Impaired Pharyngeal- Pudding Teaspoon -- Pharyngeal -- Pharyngeal- Pudding Cup -- Pharyngeal -- Pharyngeal- Honey Teaspoon -- Pharyngeal -- Pharyngeal- Honey Cup -- Pharyngeal -- Pharyngeal- Nectar Teaspoon -- Pharyngeal -- Pharyngeal- Nectar Cup -- Pharyngeal -- Pharyngeal- Nectar Straw Delayed swallow initiation-pyriform sinuses Pharyngeal -- Pharyngeal- Thin Teaspoon -- Pharyngeal -- Pharyngeal- Thin Cup -- Pharyngeal -- Pharyngeal- Thin Straw Delayed swallow  initiation-pyriform sinuses;Penetration/Aspiration during swallow Pharyngeal Material enters airway, passes BELOW cords and not ejected out despite cough attempt by patient Pharyngeal- Puree Delayed swallow initiation-pyriform sinuses Pharyngeal -- Pharyngeal- Mechanical Soft -- Pharyngeal -- Pharyngeal- Regular -- Pharyngeal -- Pharyngeal- Multi-consistency -- Pharyngeal -- Pharyngeal- Pill -- Pharyngeal -- Pharyngeal Comment --  No flowsheet data found. Juan Quam Laurice 08/11/2021, 2:59 PM                     ECHOCARDIOGRAM COMPLETE  Result Date: 08/09/2021    ECHOCARDIOGRAM REPORT   Patient Name:   JERRIAH INES Date of Exam: 08/09/2021 Medical Rec #:  [id number]    Height:       60.0 in Accession #:    [id number]   Weight:       186.5 lb Date of Birth:  1961-01-29    BSA:          1.812 m Patient Age:    75 years     BP:           100/57 mmHg Patient Gender: F            HR:           129 bpm. Exam Location:  Inpatient Procedure: 2D Echo, Cardiac Doppler and Color Doppler Indications:    Atrial fibrillation  History:        Patient has prior history of Echocardiogram examinations, most                 recent 02/03/2021. Signs/Symptoms:Shortness of Breath; Risk                 Factors:Hypertension, Diabetes and Dyslipidemia. Hx kidney                 transplant and CVA.  Sonographer:    Clayton Lefort RDCS (AE) Referring Phys: [place]  1. Possible takotsubo or stress induced cardiomyopathy.  2. Left ventricular ejection fraction, by estimation, is 35 to 40%. The left ventricle has moderately decreased function. The left ventricle has no regional wall motion abnormalities. There is severe left ventricular hypertrophy. Left ventricular diastolic parameters are indeterminate.  3. Right ventricular systolic function is normal. The right ventricular size is normal. There is mildly elevated pulmonary artery systolic pressure. The estimated right ventricular systolic pressure is 44.9 mmHg.  4.  The mitral valve is normal in structure. Mild mitral valve regurgitation. No evidence of mitral stenosis.  5. Tricuspid valve regurgitation is moderate.  6. The aortic valve is tricuspid. There is mild calcification of the aortic valve. Aortic valve  regurgitation is mild. Aortic valve sclerosis is present, with no evidence of aortic valve stenosis.  7. The inferior vena cava is normal in size with greater than 50% respiratory variability, suggesting right atrial pressure of 3 mmHg. Comparison(s): Prior images reviewed side by side. The left ventricular function is worsened. FINDINGS  Left Ventricle: Left ventricular ejection fraction, by estimation, is 35 to 40%. The left ventricle has moderately decreased function. The left ventricle has no regional wall motion abnormalities. The left ventricular internal cavity size was normal in size. There is severe left ventricular hypertrophy. Left ventricular diastolic parameters are indeterminate.  LV Wall Scoring: The mid and distal anterior wall, entire apex, mid and distal inferior wall, mid anterolateral segment, and mid inferoseptal segment are akinetic. Right Ventricle: The right ventricular size is normal. No increase in right ventricular wall thickness. Right ventricular systolic function is normal. There is mildly elevated pulmonary artery systolic pressure. The tricuspid regurgitant velocity is 2.86  m/s, and with an assumed right atrial pressure of 8 mmHg, the estimated right ventricular systolic pressure is 34.3 mmHg. Left Atrium: Left atrial size was normal in size. Right Atrium: Right atrial size was normal in size. Pericardium: There is no evidence of pericardial effusion. Mitral Valve: The mitral valve is normal in structure. Mild mitral valve regurgitation. No evidence of mitral valve stenosis. Tricuspid Valve: The tricuspid valve is normal in structure. Tricuspid valve regurgitation is moderate . No evidence of tricuspid stenosis. Aortic Valve: The aortic  valve is tricuspid. There is mild calcification of the aortic valve. Aortic valve regurgitation is mild. Aortic valve sclerosis is present, with no evidence of aortic valve stenosis. Aortic valve mean gradient measures 4.0 mmHg. Aortic valve peak gradient measures 6.6 mmHg. Aortic valve area, by VTI measures 2.69 cm. Pulmonic Valve: The pulmonic valve was normal in structure. Pulmonic valve regurgitation is not visualized. No evidence of pulmonic stenosis. Aorta: The aortic root is normal in size and structure. Venous: The inferior vena cava is normal in size with greater than 50% respiratory variability, suggesting right atrial pressure of 3 mmHg. IAS/Shunts: No atrial level shunt detected by color flow Doppler. Additional Comments: Possible takotsubo or stress induced cardiomyopathy.  LEFT VENTRICLE PLAX 2D LVIDd:         3.40 cm LVIDs:         2.50 cm LV PW:         2.00 cm LV IVS:        1.80 cm LVOT diam:     2.10 cm LV SV:         48 LV SV Index:   26 LVOT Area:     3.46 cm  LV Volumes (MOD) LV vol d, MOD A2C: 48.3 ml LV vol d, MOD A4C: 86.2 ml LV vol s, MOD A2C: 28.4 ml LV vol s, MOD A4C: 49.1 ml LV SV MOD A2C:     19.9 ml LV SV MOD A4C:     86.2 ml LV SV MOD BP:      29.0 ml RIGHT VENTRICLE             IVC RV Basal diam:  2.20 cm     IVC diam: 1.30 cm RV S prime:     15.60 cm/s TAPSE (M-mode): 1.5 cm LEFT ATRIUM           Index        RIGHT ATRIUM          Index LA diam:  3.80 cm 2.10 cm/m   RA Area:     7.32 cm LA Vol (A2C): 17.6 ml 9.71 ml/m   RA Volume:   12.30 ml 6.79 ml/m LA Vol (A4C): 29.6 ml 16.34 ml/m  AORTIC VALVE AV Area (Vmax):    2.87 cm AV Area (Vmean):   2.76 cm AV Area (VTI):     2.69 cm AV Vmax:           128.00 cm/s AV Vmean:          89.000 cm/s AV VTI:            0.178 m AV Peak Grad:      6.6 mmHg AV Mean Grad:      4.0 mmHg LVOT Vmax:         106.00 cm/s LVOT Vmean:        71.000 cm/s LVOT VTI:          0.138 m LVOT/AV VTI ratio: 0.78  AORTA Ao Root diam: 3.20 cm Ao Asc  diam:  2.80 cm TRICUSPID VALVE TR Peak grad:   32.7 mmHg TR Vmax:        286.00 cm/s  SHUNTS Systemic VTI:  0.14 m Systemic Diam: 2.10 cm Candee Furbish MD Electronically signed by Candee Furbish MD Signature Date/Time: 08/09/2021/11:45:06 AM    Final    IR PICC PLACEMENT RIGHT >5 YRS INC IMG GUIDE  Result Date: 08/04/2021 INDICATION: Patient with limited IV access.  Request for PICC line placement EXAM: ULTRASOUND AND FLUOROSCOPIC GUIDED PICC LINE INSERTION MEDICATIONS: Lidocaine 1% 2 mL CONTRAST:  None FLUOROSCOPY TIME:  3 minutes 18 seconds  (25 mGy) COMPLICATIONS: None immediate. TECHNIQUE: The procedure, risks, benefits, and alternatives were explained to the patient and informed written consent was obtained. The right upper extremity was prepped with chlorhexidine in a sterile fashion, and a sterile drape was applied covering the operative field. Maximum barrier sterile technique with sterile gowns and gloves were used for the procedure. A timeout was performed prior to the initiation of the procedure. Local anesthesia was provided with 1% lidocaine. After the overlying soft tissues were anesthetized and a micropuncture kit was utilized to access the right basilic vein. Real-time ultrasound guidance was utilized for vascular access including the acquisition of a permanent ultrasound image documenting patency of the accessed vessel. A guidewire was advanced to the level of the superior caval-atrial junction for measurement purposes and the PICC line was cut to length. A peel-away sheath was placed and a cm, 5 [place], dual lumen was inserted to level of the superior caval-atrial junction. A post procedure spot fluoroscopic was obtained. The catheter easily aspirated and flushed and was secured in place. A dressing was placed. The patient tolerated the procedure well without immediate post procedural complication. FINDINGS: After catheter placement, the tip lies within the superior cavoatrial junction. The catheter  aspirates and flushes normally and is ready for immediate use. IMPRESSION: Successful ultrasound and fluoroscopic guided placement of a right basilic vein approach, 30 cm, 5 French, dual lumen PICC with tip at the superior caval-atrial junction. The PICC line is ready for immediate use. Read by: Rushie Nyhan, NP Electronically Signed   By: Miachel Roux M.D.   On: 08/04/2021 14:48   [place] EKG SITE RITE  Result Date: 08/04/2021 If [facility] image not attached, placement could not be confirmed due to current cardiac rhythm.  [place] EKG SITE RITE  Result Date: 08/02/2021 If [facility] image not attached, placement could not be  confirmed due to current cardiac rhythm.  US Abdomen Limited RUQ (LIVER/GB)  Result Date: 08/05/2021 CLINICAL DATA:  Abdominal pain, cholecystectomy EXAM: ULTRASOUND ABDOMEN LIMITED RIGHT UPPER QUADRANT COMPARISON:  02/16/2021 FINDINGS: Gallbladder: Cholecystectomy Common bile duct: Diameter: 6 mm Liver: Normal liver echotexture. Subtle nodularity of the liver capsule could reflect early cirrhosis. No focal parenchymal abnormality or intrahepatic duct dilation. Portal vein is patent on color Doppler imaging with normal direction of blood flow towards the liver. Other: None. IMPRESSION: 1. Subtle nodularity of the liver capsule, nonspecific. This could reflect early cirrhosis. Please correlate with liver function test. 2. Otherwise unremarkable exam in a patient status post cholecystectomy. Electronically Signed   By: Randa Ngo M.D.   On: 08/05/2021 16:58     TODAY-DAY OF DISCHARGE:  Subjective:   Blanca Friend today has no headache,no chest abdominal pain,no new weakness tingling or numbness, feels much better wants to go home today.  Objective:   Blood pressure 110/62, pulse (!) 102, temperature 99.6 F (37.6 C), temperature source Oral, resp. rate 14, height 5' (1.524 m), weight 88.9 kg, SpO2 100 %.  Intake/Output Summary (Last 24 hours) at 08/14/2021 1735 Last data  filed at 08/14/2021 1231 Gross per 24 hour  Intake 380 ml  Output 50 ml  Net 330 ml   Filed Weights   08/10/21 0500 08/11/21 0326 08/14/21 0427  Weight: 85.8 kg 86.6 kg 88.9 kg    Exam: Awake Alert,  No new F.N deficits, Normal affect .AT,PERRAL Supple Neck,No JVD, No cervical lymphadenopathy appriciated.  Symmetrical Chest wall movement, Good air movement bilaterally, CTAB RRR,No Gallops,Rubs or new Murmurs, No Parasternal Heave +ve B.Sounds, Abd Soft, Non tender, No organomegaly appriciated, No rebound -guarding or rigidity. No Cyanosis, Clubbing or edema, No new Rash or bruise   PERTINENT RADIOLOGIC STUDIES: DG Chest Port 1 View  Result Date: 08/13/2021 CLINICAL DATA:  Shortness of breath EXAM: PORTABLE CHEST 1 VIEW COMPARISON:  Four days ago FINDINGS: Cardiomegaly and vascular pedicle widening, especially the main pulmonary artery. Right PICC with tip at the upper cavoatrial junction. Hazy density at both lung bases. There could be small volume pleural fluid. No pneumothorax. IMPRESSION: Cardiomegaly, pulmonary artery enlargement, and lower lobe atelectasis or infiltrates. Electronically Signed   By: Jorje Guild M.D.   On: 08/13/2021 06:02     PERTINENT LAB RESULTS: CBC: Recent Labs    08/12/21 0547 08/13/21 0123  WBC 16.5* 16.3*  HGB 7.3* 7.5*  HCT 23.3* 24.6*  PLT 201 204   CMET CMP     Component Value Date/Time   NA 144 08/14/2021 0425   K 3.8 08/14/2021 0425   CL 106 08/14/2021 0425   CO2 32 08/14/2021 0425   GLUCOSE 123 (H) 08/14/2021 0425   BUN 28 (H) 08/14/2021 0425   CREATININE 0.98 08/14/2021 0425   CALCIUM 8.1 (L) 08/14/2021 0425   CALCIUM 8.7 01/08/2011 1729   PROT 4.8 (L) 08/09/2021 0206   ALBUMIN 1.8 (L) 08/09/2021 0206   AST 10 (L) 08/09/2021 0206   ALT 9 08/09/2021 0206   ALKPHOS 35 (L) 08/09/2021 0206   BILITOT 0.4 08/09/2021 0206   GFRNONAA >60 08/14/2021 0425   GFRAA >60 09/11/2019 1439    GFR Estimated Creatinine Clearance: 60.6  mL/min (by C-G formula based on SCr of 0.98 mg/dL). No results for input(s): LIPASE, AMYLASE in the last 72 hours. No results for input(s): CKTOTAL, CKMB, CKMBINDEX, TROPONINI in the last 72 hours. Invalid input(s): POCBNP No results for input(s): DDIMER in  the last 72 hours. No results for input(s): HGBA1C in the last 72 hours. No results for input(s): CHOL, HDL, LDLCALC, TRIG, CHOLHDL, LDLDIRECT in the last 72 hours. No results for input(s): TSH, T4TOTAL, T3FREE, THYROIDAB in the last 72 hours.  Invalid input(s): FREET3 No results for input(s): VITAMINB12, FOLATE, FERRITIN, TIBC, IRON, RETICCTPCT in the last 72 hours. Coags: Recent Labs    08/13/21 0123 08/14/21 0425  INR 1.8* 2.1*   Microbiology: No results found for this or any previous visit (from the past 240 hour(s)).  FURTHER DISCHARGE INSTRUCTIONS:  Get Medicines reviewed and adjusted: Please take all your medications with you for your next visit with your Primary MD  Laboratory/radiological data: Please request your Primary MD to go over all hospital tests and procedure/radiological results at the follow up, please ask your Primary MD to get all Hospital records sent to his/her office.  In some cases, they will be blood work, cultures and biopsy results pending at the time of your discharge. Please request that your primary care M.D. goes through all the records of your hospital data and follows up on these results.  Also Note the following: If you experience worsening of your admission symptoms, develop shortness of breath, life threatening emergency, suicidal or homicidal thoughts you must seek medical attention immediately by calling 911 or calling your MD immediately  if symptoms less severe.  You must read complete instructions/literature along with all the possible adverse reactions/side effects for all the Medicines you take and that have been prescribed to you. Take any new Medicines after you have completely  understood and accpet all the possible adverse reactions/side effects.   Do not drive when taking Pain medications or sleeping medications (Benzodaizepines)  Do not take more than prescribed Pain, Sleep and Anxiety Medications. It is not advisable to combine anxiety,sleep and pain medications without talking with your primary care practitioner  Special Instructions: If you have smoked or chewed Tobacco  in the last 2 yrs please stop smoking, stop any regular Alcohol  and or any Recreational drug use.  Wear Seat belts while driving.  Please note: You were cared for by a hospitalist during your hospital stay. Once you are discharged, your primary care physician will handle any further medical issues. Please note that NO REFILLS for any discharge medications will be authorized once you are discharged, as it is imperative that you return to your primary care physician (or establish a relationship with a primary care physician if you do not have one) for your post hospital discharge needs so that they can reassess your need for medications and monitor your lab values.  Total Time spent coordinating discharge including counseling, education and face to face time equals greater than 30 minutes.  SignedOren Binet 08/14/2021 5:35 PM

## 2021-08-18 ENCOUNTER — Telehealth (HOSPITAL_COMMUNITY): Payer: Self-pay | Admitting: *Deleted

## 2021-08-18 MED FILL — Phytonadione Tab 100 MCG: ORAL | Qty: 1 | Status: AC

## 2021-08-18 NOTE — Telephone Encounter (Signed)
Close encounter 

## 2021-08-19 ENCOUNTER — Ambulatory Visit (HOSPITAL_COMMUNITY)
Admit: 2021-08-19 | Discharge: 2021-08-19 | Disposition: A | Discharge status: Home / Self Care | Payer: Medicare Other | Attending: Cardiology | Admitting: Cardiology

## 2021-08-21 DIAGNOSIS — I6330 Cerebral infarction due to thrombosis of unspecified cerebral artery: Secondary | ICD-10-CM | POA: Diagnosis not present

## 2021-08-21 DIAGNOSIS — J690 Pneumonitis due to inhalation of food and vomit: Secondary | ICD-10-CM | POA: Diagnosis not present

## 2021-08-24 DIAGNOSIS — J690 Pneumonitis due to inhalation of food and vomit: Secondary | ICD-10-CM | POA: Diagnosis not present

## 2021-08-24 DIAGNOSIS — I6330 Cerebral infarction due to thrombosis of unspecified cerebral artery: Secondary | ICD-10-CM | POA: Diagnosis not present

## 2021-08-26 DIAGNOSIS — I6330 Cerebral infarction due to thrombosis of unspecified cerebral artery: Secondary | ICD-10-CM | POA: Diagnosis not present

## 2021-08-26 DIAGNOSIS — J690 Pneumonitis due to inhalation of food and vomit: Secondary | ICD-10-CM | POA: Diagnosis not present

## 2021-08-26 NOTE — Progress Notes (Deleted)
?Cardiology Office Note:   ? ?Date:  08/26/2021  ? ?ID:  Connie Ruiz, DOB 06-Oct-1960, MRN [id number] ? ?PCP:  [name], [name], MD ?[place] Cardiologist: Quay Burow, MD  ? ?Reason for visit: Hospital follow-up ? ?History of Present Illness:   ? 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  No longer on furosemide as oral intake is so poor-and at risk for AKI/hyponatremia.  Was placed on bisoprolol and losartan.   Unclear whether this is Takotsubo cardiomyopathy or ischemic etiology.  Cardiology had plan to pursue ischemic evaluation as an outpatient-but do not feel that this is necessary as plans are to transition to comfort measures.   ? ?  ?Past Medical History:  ?Diagnosis Date  ? Anxiety   ? Candida esophagitis ([place]) 11/12/2014  ? CEREBROVASCULAR ACCIDENT, ACUTE 04/15/2010  ? CLOSTRIDIUM DIFFICILE COLITIS, HX OF 08/21/2007  ? CONGESTIVE HEART FAILURE 08/21/2007  ? Current use of long term anticoagulation   ? Dr. Andree Elk, [facility]  ? CVA 04/17/2010  ? Depression   ? Dr. Andree Elk, [facility]  ? DIABETES MELLITUS, TYPE II 08/21/2007  ? DVT, HX OF 08/21/2007  ? GERD 08/21/2007  ? GOUT 08/21/2007  ? History of stroke with residual effects   ? HYPERLIPIDEMIA 08/21/2007  ? HYPERTENSION 08/21/2007  ? Dr. Andree Elk, [facility]  ? [place], HX OF 08/22/2007  ? s/p renal transplant-Dr. Bonney Leitz  ? LUPUS 08/21/2007  ? OSTEOPOROSIS 08/21/2007  ? Rheumatol at [facility]  ? Pulmonary embolism ([place]) 07/16/2010  ? Renal failure   ? RENAL INSUFFICIENCY 08/21/2007  ? Right sided weakness   ? Steroid-induced hyperglycemia 11/09/2014  ? Tachycardia   ? THYROID NODULE, LEFT 04/10/2009  ? ? ?Past Surgical History:  ?Procedure Laterality Date  ? BIOPSY  02/04/2021  ? Procedure: BIOPSY;  Surgeon: [name], MD;  Location: [facility] ENDOSCOPY;  Service: Endoscopy;;  ? CESAREAN SECTION    ?  CHOLECYSTECTOMY    ? COLONOSCOPY  06/2000  ? [place]   ? ENTEROSCOPY N/A 11/11/2014  ? Procedure: ENTEROSCOPY;  Surgeon: Ladene Artist, MD;  Location: [facility] ENDOS[facility];  Service: Endoscopy;  Laterality: N/A;  ? FLEXIBLE SIGMOIDOSCOPY N/A 02/04/2021  ? Procedure: FLEXIBLE SIGMOIDOSCOPY;  Surgeon: [name], MD;  Location: [facility] ENDOS[facility];  Service: Endoscopy;  Laterality: N/A;  ? KIDNEY TRANSPLANT Right 2009  ? RENAL BIOPSY, OPEN  1981  ? TUBAL LIGATION    ? ? ?Current Medications: ?No outpatient medications have been marked as taking for the 08/27/21 encounter (Appointment) with Warren Lacy, PA-C.  ?  ? ?Allergies:   Oxycodone-acetaminophen, Propoxyphene, Sulfonamide derivatives, Codeine, Hydrocodone-acetaminophen, Hydromorphone, Other, Sulfamethoxazole, Tape, Gabapentin, Latex, Metoprolol, Morphine and related, and Rosiglitazone  ? ?Social History  ? ?Socioeconomic History  ? Marital status: Divorced  ?  Spouse name: Not on file  ? Number of children: 1  ? Years of education: Not on file  ? Highest education level: Not on file  ?Occupational History  ? Occupation: DISABILITY  ?  Employer: UNEMPLOYED  ?Tobacco Use  ? Smoking status: Never  ? Smokeless tobacco: Never  ?Vaping Use  ? Vaping Use: Never used  ?Substance and Sexual Activity  ? Alcohol use: No  ?  Alcohol/week: 0.0 standard drinks  ? Drug use: No  ? Sexual activity: Not Currently  ?  Partners: Male  ?  Birth control/protection: Post-menopausal  ?Other Topics  Concern  ? Not on file  ?Social History Narrative  ? Retired  ? Regular exercise-no  ? Pt completed hs.   ? ?Social Determinants of Health  ? ?Financial Resource Strain: Not on file  ?Food Insecurity: Not on file  ?Transportation Needs: Not on file  ?Physical Activity: Not on file  ?Stress: Not on file  ?Social Connections: Not on file  ?  ? ?Family History: ?The patient's family history includes Asthma in her daughter and sister; Cancer in her maternal grandfather and paternal grandfather;  Heart attack in her mother; Heart disease in her father. ? ?ROS:   ?Please see the history of present illness.    ? ?EKGs/Labs/Other Studies Reviewed:   ? ?EKG:  The ekg ordered today demonstrates *** ? ?Recent Labs: ?08/08/2021: TSH 8.378 ?08/09/2021: ALT 9 ?08/11/2021: Magnesium 2.3 ?08/13/2021: Hemoglobin 7.5; Platelets 204 ?08/14/2021: BUN 28; Creatinine, Ser 0.98; Potassium 3.8; Sodium 144  ? ?Recent Lipid Panel ?Lab Results  ?Component Value Date/Time  ? CHOL 143 08/10/2021 04:33 AM  ? TRIG 169 (H) 08/10/2021 04:33 AM  ? HDL 37 (L) 08/10/2021 04:33 AM  ? LDLCALC 72 08/10/2021 04:33 AM  ? ? ?Physical Exam:   ? ?VS:  There were no vitals taken for this visit.   ?No data found. ? ?Wt Readings from Last 3 Encounters:  ?08/14/21 196 lb (88.9 kg)  ?02/13/21 208 lb 15.9 oz (94.8 kg)  ?08/21/20 196 lb (88.9 kg)  ?  ? ?GEN: *** Well nourished, well developed in no acute distress ?HEENT: Normal ?NECK: No JVD; No carotid bruits ?CARDIAC: ***RRR, no murmurs, rubs, gallops ?RESPIRATORY:  Clear to auscultation without rales, wheezing or rhonchi  ?ABDOMEN: Soft, non-tender, non-distended ?MUSCULOSKELETAL: No edema; No deformity  ?SKIN: Warm and dry ?NEUROLOGIC:  Alert and oriented ?PSYCHIATRIC:  Normal affect  ? ?  ?ASSESSMENT AND PLAN  ? ?*** ? ? ?{Are you ordering a CV Procedure (e.g. stress test, cath, DCCV, TEE, etc)?   Press F2        :[id number]}  ? ? ?Medication Adjustments/Labs and Tests Ordered: ?Current medicines are reviewed at length with the patient today.  Concerns regarding medicines are outlined above.  ?No orders of the defined types were placed in this encounter. ? ?No orders of the defined types were placed in this encounter. ? ? ?There are no Patient Instructions on file for this visit.  ? ?Signed, ?Warren Lacy, PA-C  ?08/26/2021 9:39 PM    ?[place] ?

## 2021-08-27 ENCOUNTER — Ambulatory Visit: Payer: Medicare Other | Admitting: Physician Assistant

## 2021-08-30 DIAGNOSIS — I6330 Cerebral infarction due to thrombosis of unspecified cerebral artery: Secondary | ICD-10-CM | POA: Diagnosis not present

## 2021-08-30 DIAGNOSIS — J690 Pneumonitis due to inhalation of food and vomit: Secondary | ICD-10-CM | POA: Diagnosis not present

## 2021-08-31 ENCOUNTER — Encounter: Payer: Self-pay | Admitting: Physician Assistant

## 2021-09-02 DIAGNOSIS — J690 Pneumonitis due to inhalation of food and vomit: Secondary | ICD-10-CM | POA: Diagnosis not present

## 2021-09-02 DIAGNOSIS — I6330 Cerebral infarction due to thrombosis of unspecified cerebral artery: Secondary | ICD-10-CM | POA: Diagnosis not present

## 2021-10-05 DEATH — deceased

## 2022-09-10 IMAGING — CT CT CHEST W/O CM
2 of 4 series · 13 of 36 positions shown, 16 images · non-contrast
Comparison: :
COMPARISON: Chest x-ray from earlier in the same day, CT from 08/21/2020

CLINICAL DATA: Chest pain and shortness of breath with left-sided
abdominal pain for several days

EXAM:
CT CHEST, ABDOMEN AND PELVIS WITHOUT CONTRAST
TECHNIQUE: Multidetector CT imaging of the chest, abdomen and pelvis was
performed following the standard protocol without IV contrast.

[Series 2: cap w/o · axial · non-contrast · 0.88mm/px · z∈[+1076,+1566]mm · 10 of 120 slices shown, 13 images]
[im 11/120  mediastinal]
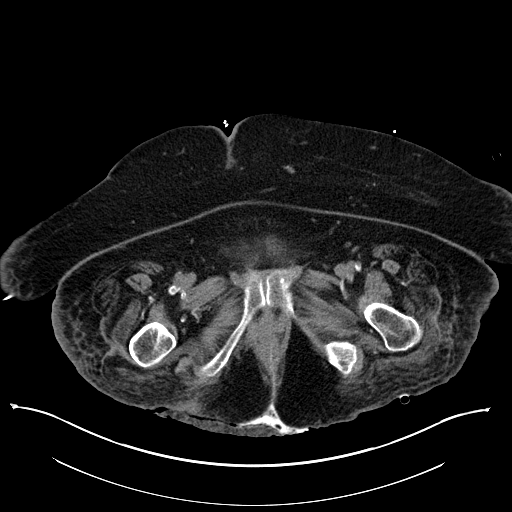
[im 11/120  lung]
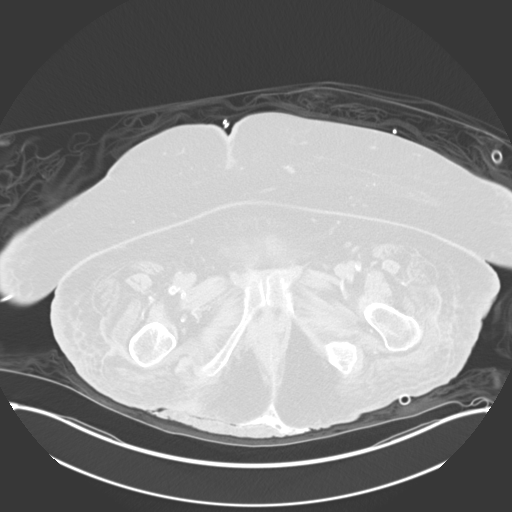
[im 22/120  lung]
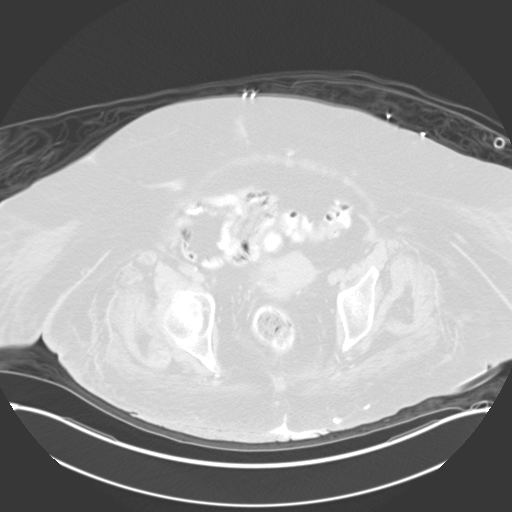
[im 33/120  lung]
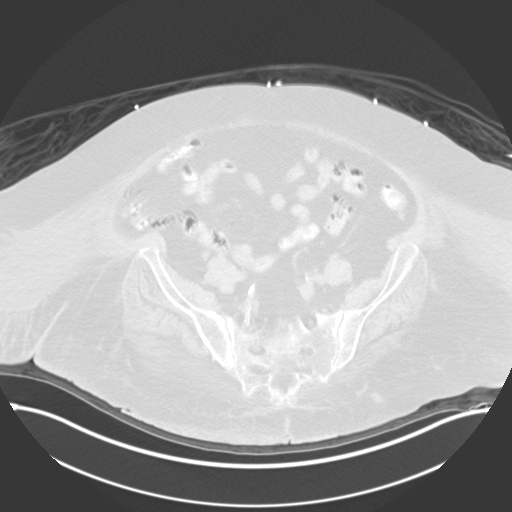
[im 44/120  lung]
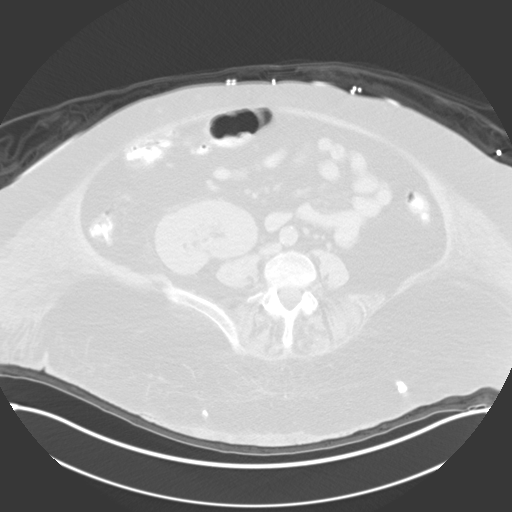
[im 55/120  mediastinal]
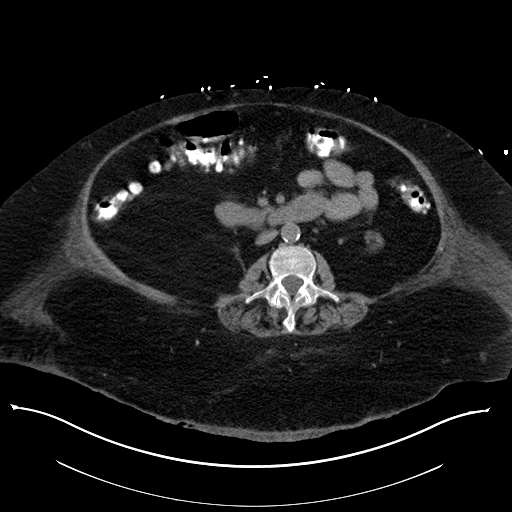
[im 55/120  lung]
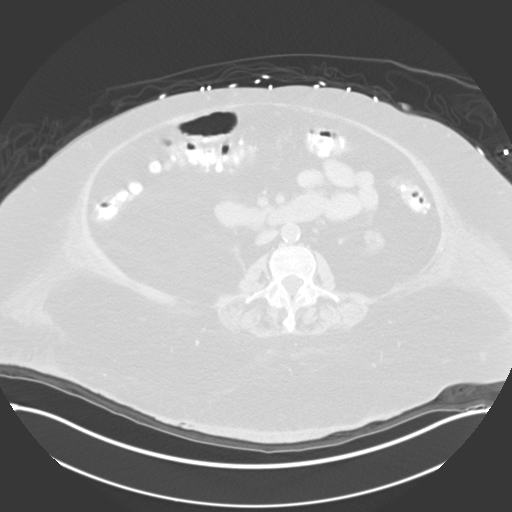
[im 65/120  lung]
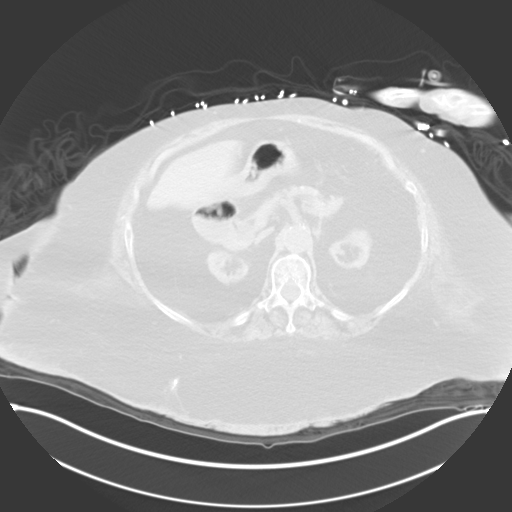
[im 76/120  lung]
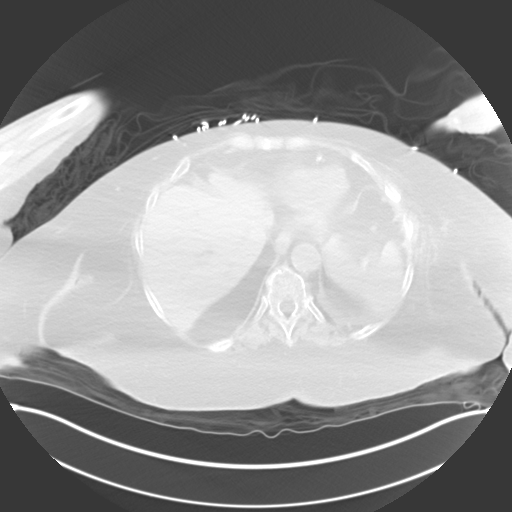
[im 87/120  lung]
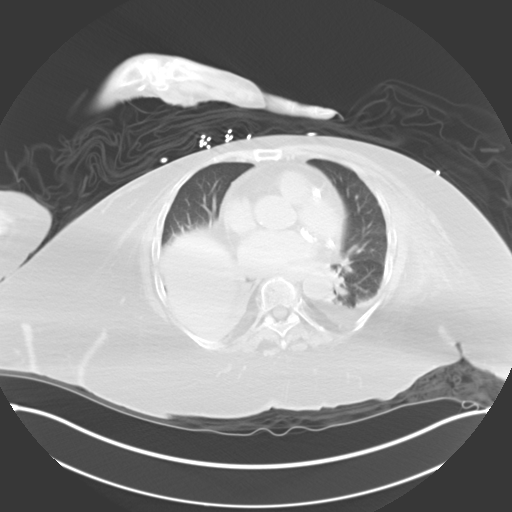
[im 98/120  mediastinal]
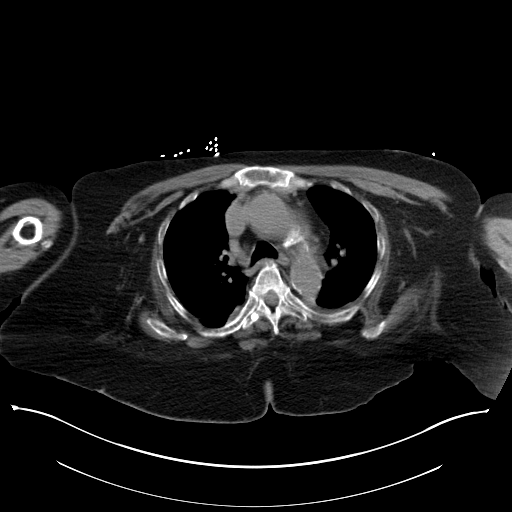
[im 98/120  lung]
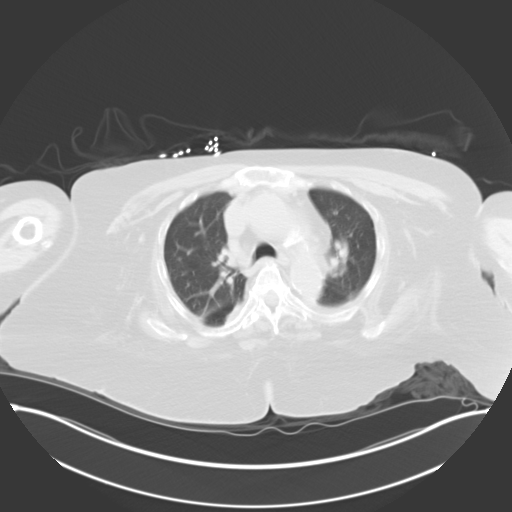
[im 109/120  lung]
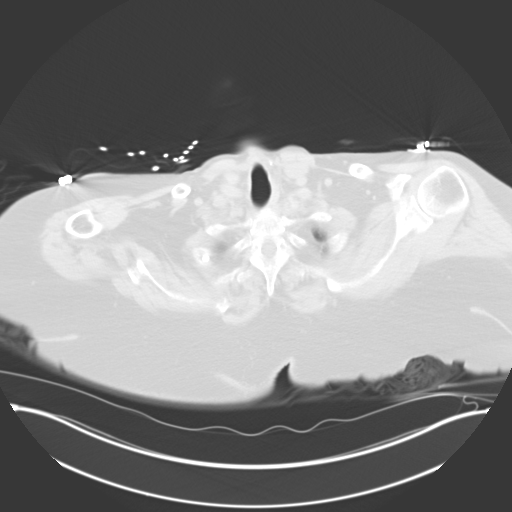

[Series 3: coronals · coronal · 0.94mm/px · 3 of 137 slices shown]
[im 28/137  lung]
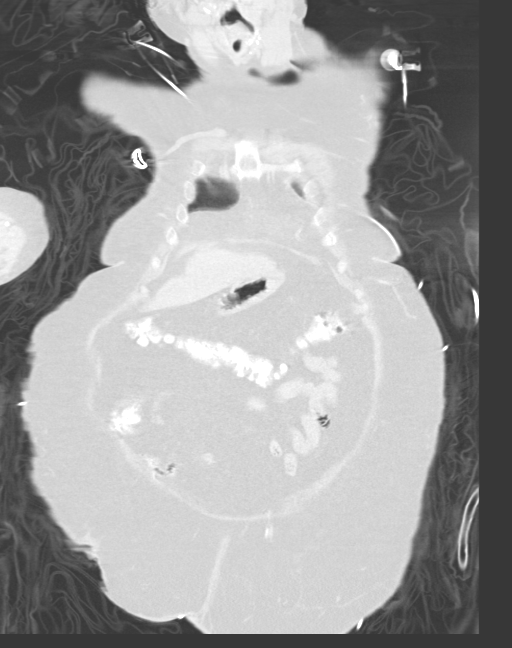
[im 55/137  lung]
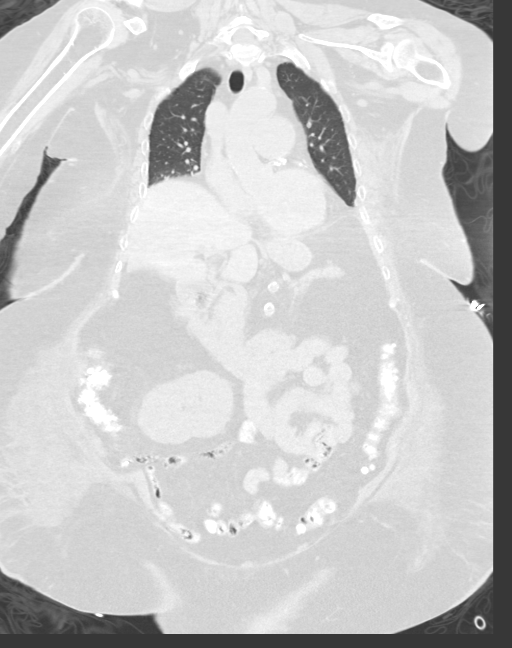
[im 82/137  lung]
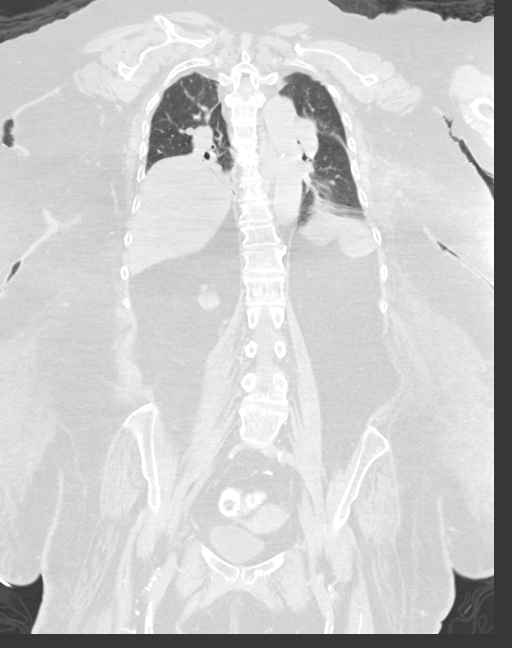

[13 of 36 positions shown; findings below may reference images not displayed]

FINDINGS: CT CHEST FINDINGS

Cardiovascular: Somewhat limited due to lack of IV contrast. Aortic
calcifications are noted without aneurysmal dilatation. No cardiac
enlargement is seen. Coronary calcifications are noted. Pulmonary
artery as visualized is within normal limits.

Mediastinum/Nodes: Thoracic inlet is unremarkable. No sizable hilar
or mediastinal adenopathy is noted. The esophagus is within normal
limits.

Lungs/Pleura: Bilateral lower lobe consolidation increased when
compared with the prior CT from 1 week previous. Small left pleural
effusion is noted. No parenchymal nodules are seen.

Musculoskeletal: Degenerative changes of the thoracic spine are
noted. No acute rib abnormality is seen. Old rib fractures are noted
on the left involving the seventh through ninth ribs.

CT ABDOMEN PELVIS FINDINGS

Hepatobiliary: No focal liver abnormality is seen. Status post
cholecystectomy. No biliary dilatation.

Pancreas: Unremarkable. No pancreatic ductal dilatation or
surrounding inflammatory changes.

Spleen: Normal in size without focal abnormality.

Adrenals/Urinary Tract: Adrenal glands are within normal limits. The
kidneys are shrunken similar to that seen on prior CT examination
consistent with end-stage renal disease. Transplant kidney is noted
in the right lower quadrant without evidence of hydronephrosis. The
bladder is partially distended.

Stomach/Bowel: Scattered diverticular changes noted without evidence
of diverticulitis. No obstructive changes are seen. The appendix is
within normal limits. Small bowel and stomach appear within normal
limits with the exception of a small sliding-type hiatal hernia.

Vascular/Lymphatic: Aortic atherosclerosis. No enlarged abdominal or
pelvic lymph nodes.

Reproductive: Uterus and bilateral adnexa are unremarkable.

Other: No abdominal wall hernia or abnormality. No abdominopelvic
ascites.

Musculoskeletal: Generalized osteopenia is noted. No acute bony
abnormality is seen.
IMPRESSION: Diverticulosis without diverticulitis.

New bilateral lower lobe consolidation with left-sided effusion.

No other acute abnormality is noted.
